# Patient Record
Sex: Female | Born: 1960 | Race: White | Hispanic: No | Smoking: Current every day smoker
Health system: Southern US, Community
[De-identification: ages and names within clinical notes are randomized; demographics above are authoritative.]

## PROBLEM LIST (undated history)

## (undated) DIAGNOSIS — E11621 Type 2 diabetes mellitus with foot ulcer: Secondary | ICD-10-CM

## (undated) DIAGNOSIS — G4730 Sleep apnea, unspecified: Secondary | ICD-10-CM

## (undated) DIAGNOSIS — N189 Chronic kidney disease, unspecified: Secondary | ICD-10-CM

## (undated) DIAGNOSIS — K279 Peptic ulcer, site unspecified, unspecified as acute or chronic, without hemorrhage or perforation: Secondary | ICD-10-CM

## (undated) DIAGNOSIS — D649 Anemia, unspecified: Secondary | ICD-10-CM

## (undated) DIAGNOSIS — J449 Chronic obstructive pulmonary disease, unspecified: Secondary | ICD-10-CM

## (undated) DIAGNOSIS — R0600 Dyspnea, unspecified: Secondary | ICD-10-CM

## (undated) DIAGNOSIS — F329 Major depressive disorder, single episode, unspecified: Secondary | ICD-10-CM

## (undated) DIAGNOSIS — R51 Headache: Secondary | ICD-10-CM

## (undated) DIAGNOSIS — L97509 Non-pressure chronic ulcer of other part of unspecified foot with unspecified severity: Secondary | ICD-10-CM

## (undated) DIAGNOSIS — G629 Polyneuropathy, unspecified: Secondary | ICD-10-CM

## (undated) DIAGNOSIS — I10 Essential (primary) hypertension: Secondary | ICD-10-CM

## (undated) DIAGNOSIS — Z860100 Personal history of colon polyps, unspecified: Secondary | ICD-10-CM

## (undated) DIAGNOSIS — I219 Acute myocardial infarction, unspecified: Secondary | ICD-10-CM

## (undated) DIAGNOSIS — K219 Gastro-esophageal reflux disease without esophagitis: Secondary | ICD-10-CM

## (undated) DIAGNOSIS — E785 Hyperlipidemia, unspecified: Secondary | ICD-10-CM

## (undated) DIAGNOSIS — M1990 Unspecified osteoarthritis, unspecified site: Secondary | ICD-10-CM

## (undated) DIAGNOSIS — F32A Depression, unspecified: Secondary | ICD-10-CM

## (undated) DIAGNOSIS — F419 Anxiety disorder, unspecified: Secondary | ICD-10-CM

## (undated) HISTORY — DX: Gastro-esophageal reflux disease without esophagitis: K21.9

## (undated) HISTORY — DX: Depression, unspecified: F32.A

## (undated) HISTORY — DX: Personal history of colon polyps, unspecified: Z86.0100

## (undated) HISTORY — DX: Major depressive disorder, single episode, unspecified: F32.9

## (undated) HISTORY — DX: Anemia, unspecified: D64.9

## (undated) HISTORY — DX: Headache: R51

## (undated) HISTORY — DX: Essential (primary) hypertension: I10

## (undated) HISTORY — DX: Peptic ulcer, site unspecified, unspecified as acute or chronic, without hemorrhage or perforation: K27.9

## (undated) HISTORY — DX: Hyperlipidemia, unspecified: E78.5

## (undated) HISTORY — DX: Anxiety disorder, unspecified: F41.9

## (undated) HISTORY — PX: TUBAL LIGATION: SHX77

## (undated) HISTORY — DX: Personal history of colonic polyps: Z86.010

---

## 1998-02-19 ENCOUNTER — Inpatient Hospital Stay (HOSPITAL_COMMUNITY): Admission: RE | Admit: 1998-02-19 | Discharge: 1998-02-21 | Payer: Self-pay | Admitting: Obstetrics and Gynecology

## 1999-11-25 ENCOUNTER — Encounter: Payer: Self-pay | Admitting: Family Medicine

## 1999-11-25 ENCOUNTER — Ambulatory Visit (HOSPITAL_COMMUNITY): Admission: RE | Admit: 1999-11-25 | Discharge: 1999-11-25 | Payer: Self-pay | Admitting: Family Medicine

## 1999-12-10 ENCOUNTER — Inpatient Hospital Stay (HOSPITAL_COMMUNITY): Admission: EM | Admit: 1999-12-10 | Discharge: 1999-12-12 | Payer: Self-pay | Admitting: Emergency Medicine

## 2000-06-11 ENCOUNTER — Encounter: Payer: Self-pay | Admitting: Family Medicine

## 2000-06-11 ENCOUNTER — Ambulatory Visit (HOSPITAL_COMMUNITY): Admission: RE | Admit: 2000-06-11 | Discharge: 2000-06-11 | Payer: Self-pay | Admitting: Family Medicine

## 2002-11-03 ENCOUNTER — Ambulatory Visit (HOSPITAL_COMMUNITY): Admission: RE | Admit: 2002-11-03 | Discharge: 2002-11-03 | Payer: Self-pay | Admitting: Family Medicine

## 2002-11-03 ENCOUNTER — Encounter: Payer: Self-pay | Admitting: Family Medicine

## 2004-01-07 ENCOUNTER — Ambulatory Visit (HOSPITAL_COMMUNITY): Admission: RE | Admit: 2004-01-07 | Discharge: 2004-01-07 | Payer: Self-pay | Admitting: Emergency Medicine

## 2004-01-08 ENCOUNTER — Emergency Department (HOSPITAL_COMMUNITY): Admission: EM | Admit: 2004-01-08 | Discharge: 2004-01-08 | Payer: Self-pay | Admitting: Emergency Medicine

## 2004-02-27 ENCOUNTER — Other Ambulatory Visit: Admission: RE | Admit: 2004-02-27 | Discharge: 2004-02-27 | Payer: Self-pay | Admitting: Family Medicine

## 2005-07-10 ENCOUNTER — Encounter: Admission: RE | Admit: 2005-07-10 | Discharge: 2005-07-10 | Payer: Self-pay | Admitting: Sports Medicine

## 2005-07-24 ENCOUNTER — Ambulatory Visit (HOSPITAL_COMMUNITY): Admission: RE | Admit: 2005-07-24 | Discharge: 2005-07-24 | Payer: Self-pay | Admitting: *Deleted

## 2005-07-27 ENCOUNTER — Ambulatory Visit (HOSPITAL_COMMUNITY): Admission: RE | Admit: 2005-07-27 | Discharge: 2005-07-27 | Payer: Self-pay | Admitting: *Deleted

## 2005-08-06 ENCOUNTER — Encounter: Admission: RE | Admit: 2005-08-06 | Discharge: 2005-08-06 | Payer: Self-pay | Admitting: Sports Medicine

## 2005-09-18 ENCOUNTER — Other Ambulatory Visit: Admission: RE | Admit: 2005-09-18 | Discharge: 2005-09-18 | Payer: Self-pay | Admitting: Family Medicine

## 2005-09-25 ENCOUNTER — Encounter: Admission: RE | Admit: 2005-09-25 | Discharge: 2005-09-25 | Payer: Self-pay | Admitting: Sports Medicine

## 2005-12-01 ENCOUNTER — Encounter: Admission: RE | Admit: 2005-12-01 | Discharge: 2005-12-01 | Payer: Self-pay | Admitting: Sports Medicine

## 2005-12-16 ENCOUNTER — Encounter: Admission: RE | Admit: 2005-12-16 | Discharge: 2005-12-16 | Payer: Self-pay | Admitting: Sports Medicine

## 2006-01-25 ENCOUNTER — Encounter: Admission: RE | Admit: 2006-01-25 | Discharge: 2006-01-25 | Payer: Self-pay | Admitting: Sports Medicine

## 2006-02-03 ENCOUNTER — Encounter: Admission: RE | Admit: 2006-02-03 | Discharge: 2006-02-03 | Payer: Self-pay | Admitting: Sports Medicine

## 2006-02-19 ENCOUNTER — Encounter: Admission: RE | Admit: 2006-02-19 | Discharge: 2006-02-19 | Payer: Self-pay | Admitting: Sports Medicine

## 2006-12-02 ENCOUNTER — Inpatient Hospital Stay (HOSPITAL_COMMUNITY): Admission: AD | Admit: 2006-12-02 | Discharge: 2006-12-10 | Payer: Self-pay | Admitting: Psychiatry

## 2006-12-02 ENCOUNTER — Ambulatory Visit: Payer: Self-pay | Admitting: Psychiatry

## 2009-01-08 ENCOUNTER — Encounter (INDEPENDENT_AMBULATORY_CARE_PROVIDER_SITE_OTHER): Payer: Self-pay | Admitting: *Deleted

## 2009-10-17 ENCOUNTER — Telehealth: Payer: Self-pay | Admitting: Gastroenterology

## 2010-02-11 ENCOUNTER — Encounter: Payer: Self-pay | Admitting: Internal Medicine

## 2010-02-19 ENCOUNTER — Encounter: Payer: Self-pay | Admitting: Internal Medicine

## 2010-02-27 ENCOUNTER — Encounter: Payer: Self-pay | Admitting: Internal Medicine

## 2010-02-27 DIAGNOSIS — E119 Type 2 diabetes mellitus without complications: Secondary | ICD-10-CM | POA: Insufficient documentation

## 2010-02-27 DIAGNOSIS — F32A Depression, unspecified: Secondary | ICD-10-CM | POA: Insufficient documentation

## 2010-02-27 DIAGNOSIS — M47817 Spondylosis without myelopathy or radiculopathy, lumbosacral region: Secondary | ICD-10-CM | POA: Insufficient documentation

## 2010-03-18 LAB — CONVERTED CEMR LAB: Pap Smear: NORMAL

## 2010-07-16 ENCOUNTER — Emergency Department (HOSPITAL_COMMUNITY)
Admission: EM | Admit: 2010-07-16 | Discharge: 2010-07-16 | Payer: Self-pay | Source: Home / Self Care | Admitting: Emergency Medicine

## 2010-08-19 LAB — HM PAP SMEAR: HM Pap smear: NORMAL

## 2010-09-11 ENCOUNTER — Encounter: Payer: Self-pay | Admitting: Internal Medicine

## 2010-09-11 ENCOUNTER — Ambulatory Visit: Payer: Self-pay | Admitting: Internal Medicine

## 2010-09-11 ENCOUNTER — Telehealth (INDEPENDENT_AMBULATORY_CARE_PROVIDER_SITE_OTHER): Payer: Self-pay | Admitting: *Deleted

## 2010-09-11 DIAGNOSIS — F418 Other specified anxiety disorders: Secondary | ICD-10-CM | POA: Insufficient documentation

## 2010-09-11 DIAGNOSIS — R51 Headache: Secondary | ICD-10-CM | POA: Insufficient documentation

## 2010-09-11 DIAGNOSIS — IMO0002 Reserved for concepts with insufficient information to code with codable children: Secondary | ICD-10-CM | POA: Insufficient documentation

## 2010-09-11 DIAGNOSIS — Z794 Long term (current) use of insulin: Secondary | ICD-10-CM

## 2010-09-11 DIAGNOSIS — D509 Iron deficiency anemia, unspecified: Secondary | ICD-10-CM | POA: Insufficient documentation

## 2010-09-11 DIAGNOSIS — E1169 Type 2 diabetes mellitus with other specified complication: Secondary | ICD-10-CM

## 2010-09-11 DIAGNOSIS — F17200 Nicotine dependence, unspecified, uncomplicated: Secondary | ICD-10-CM | POA: Insufficient documentation

## 2010-09-11 DIAGNOSIS — E785 Hyperlipidemia, unspecified: Secondary | ICD-10-CM | POA: Insufficient documentation

## 2010-09-11 DIAGNOSIS — R519 Headache, unspecified: Secondary | ICD-10-CM | POA: Insufficient documentation

## 2010-09-11 DIAGNOSIS — K219 Gastro-esophageal reflux disease without esophagitis: Secondary | ICD-10-CM | POA: Insufficient documentation

## 2010-09-11 DIAGNOSIS — I10 Essential (primary) hypertension: Secondary | ICD-10-CM | POA: Insufficient documentation

## 2010-09-11 DIAGNOSIS — E1165 Type 2 diabetes mellitus with hyperglycemia: Secondary | ICD-10-CM

## 2010-09-11 DIAGNOSIS — Z860100 Personal history of colon polyps, unspecified: Secondary | ICD-10-CM | POA: Insufficient documentation

## 2010-09-11 DIAGNOSIS — E119 Type 2 diabetes mellitus without complications: Secondary | ICD-10-CM | POA: Insufficient documentation

## 2010-09-11 DIAGNOSIS — F411 Generalized anxiety disorder: Secondary | ICD-10-CM | POA: Insufficient documentation

## 2010-09-11 DIAGNOSIS — K279 Peptic ulcer, site unspecified, unspecified as acute or chronic, without hemorrhage or perforation: Secondary | ICD-10-CM | POA: Insufficient documentation

## 2010-09-11 LAB — CONVERTED CEMR LAB
ALT: 14 units/L (ref 0–35)
AST: 22 units/L (ref 0–37)
Albumin: 3.4 g/dL — ABNORMAL LOW (ref 3.5–5.2)
Basophils Relative: 0.6 % (ref 0.0–3.0)
Creatinine,U: 118.2 mg/dL
Eosinophils Relative: 5.3 % — ABNORMAL HIGH (ref 0.0–5.0)
GFR calc non Af Amer: 86.29 mL/min (ref >60.00)
HCT: 33.7 % — ABNORMAL LOW (ref 36.0–46.0)
HDL: 37.1 mg/dL — ABNORMAL LOW (ref >39.00)
Hemoglobin: 11.1 g/dL — ABNORMAL LOW (ref 12.0–15.0)
Ketones, ur: NEGATIVE mg/dL
Lithium Lvl: 0.73 meq/L — ABNORMAL LOW
Lymphs Abs: 2.6 10*3/uL (ref 0.7–4.0)
Microalb Creat Ratio: 11.6 mg/g (ref 0.0–30.0)
Monocytes Relative: 4.8 % (ref 3.0–12.0)
Neutro Abs: 6.9 10*3/uL (ref 1.4–7.7)
Potassium: 4.7 meq/L (ref 3.5–5.1)
RBC: 4.15 M/uL (ref 3.87–5.11)
Sodium: 136 meq/L (ref 135–145)
Specific Gravity, Urine: 1.015 (ref 1.000–1.030)
TSH: 1.7 microintl units/mL (ref 0.35–5.50)
Total Bilirubin: 0.2 mg/dL — ABNORMAL LOW (ref 0.3–1.2)
Transferrin: 285 mg/dL (ref 212.0–360.0)
Urine Glucose: >=1000 mg/dL
Urobilinogen, UA: 0.2 (ref 0.0–1.0)
VLDL: 42.2 mg/dL — ABNORMAL HIGH (ref 0.0–40.0)
Vitamin B-12: 161 pg/mL — ABNORMAL LOW (ref 211–911)
WBC: 10.7 10*3/uL — ABNORMAL HIGH (ref 4.5–10.5)

## 2010-09-23 ENCOUNTER — Telehealth: Payer: Self-pay | Admitting: Internal Medicine

## 2010-09-23 ENCOUNTER — Ambulatory Visit: Payer: Self-pay | Admitting: Internal Medicine

## 2010-09-26 ENCOUNTER — Encounter
Admission: RE | Admit: 2010-09-26 | Discharge: 2010-09-26 | Payer: Self-pay | Source: Home / Self Care | Attending: Internal Medicine | Admitting: Internal Medicine

## 2010-09-30 ENCOUNTER — Telehealth: Payer: Self-pay | Admitting: Internal Medicine

## 2010-10-01 ENCOUNTER — Ambulatory Visit
Admission: RE | Admit: 2010-10-01 | Discharge: 2010-10-01 | Payer: Self-pay | Source: Home / Self Care | Attending: Internal Medicine | Admitting: Internal Medicine

## 2010-10-01 DIAGNOSIS — G5620 Lesion of ulnar nerve, unspecified upper limb: Secondary | ICD-10-CM | POA: Insufficient documentation

## 2010-10-01 DIAGNOSIS — J019 Acute sinusitis, unspecified: Secondary | ICD-10-CM | POA: Insufficient documentation

## 2010-10-01 DIAGNOSIS — D518 Other vitamin B12 deficiency anemias: Secondary | ICD-10-CM | POA: Insufficient documentation

## 2010-10-08 ENCOUNTER — Ambulatory Visit: Admit: 2010-10-08 | Payer: Self-pay | Admitting: Internal Medicine

## 2010-10-25 ENCOUNTER — Encounter: Payer: Self-pay | Admitting: *Deleted

## 2010-10-27 ENCOUNTER — Telehealth: Payer: Self-pay | Admitting: Internal Medicine

## 2010-11-04 NOTE — Progress Notes (Signed)
Summary: Schedule NP3  Phone Note Outgoing Call Call back at [facility] Phone [phone]   Call placed by: Harlow Mares CMA Duncan Dull),  October 17, 2009 2:09 PM Call placed to: Patient Summary of Call: patients number is disconnected and the only other number is a emergancy contact that is disconnected also.  Initial call taken by: Harlow Mares CMA (AAMA),  October 17, 2009 2:09 PM

## 2010-11-04 NOTE — Assessment & Plan Note (Signed)
Summary: NEW/ MEDICARE/ DM / NWS  #   Vital Signs:  Patient profile:   50 year old female Menstrual status:  irregular LMP:     09/11/2010 Height:      66 inches Weight:      310 pounds BMI:     50.22 O2 Sat:      95 % on Room air Temp:     97.5 degrees F oral Pulse rate:   90 / minute Pulse rhythm:   regular Resp:     16 per minute BP sitting:   140 / 70  (left arm) Cuff size:   large  Vitals Entered By: Rock Nephew CMA (September 11, 2010 8:59 AM)  O2 Flow:  Room air CC: New to establish, Preventive Care, Hypertension Management Is Patient Diabetic? Yes Did you bring your meter with you today? No Pain Assessment Patient in pain? no       Does patient need assistance? Functional Status Self care Ambulation Normal LMP (date): 09/11/2010     Menstrual Status irregular Enter LMP: 09/11/2010 Last PAP Result Normal   Primary Care Provider:  Etta Grandchild MD  CC:  New to establish, Preventive Care, and Hypertension Management.  History of Present Illness: New to me she needs a new PCP - she has no records from her prior PCP Dr. Maisie Fus in [place]. She saw a neurologist, Dr. Adella Hare, in [place] several months ago for weakness and she tells me that his exam and a brain scan were normal and she was told that her symptoms were from a low magnesium level. She says that her last A1C= 10.1 about 3-4 months ago. She is homeless b/c her husband took their children away from her and she lost her child support.  Dyspepsia History:      She has no alarm features of dyspepsia including no history of melena, hematochezia, dysphagia, persistent vomiting, or involuntary weight loss > 5%.  There is a prior history of GERD.  The patient has a prior history of documented ulcer disease.  The dominant symptom is heartburn or acid reflux.  An H-2 blocker medication is currently being taken.  She notes that the symptoms have improved with the H-2 blocker therapy.  Symptoms have not  persisted after 4 weeks of H-2 blocker treatment.    Hypertension History:      She complains of side effects from treatment, but denies headache, chest pain, palpitations, dyspnea with exertion, orthopnea, PND, peripheral edema, visual symptoms, neurologic problems, and syncope.  She notes the following problems with antihypertensive medication side effects: cough.        Positive major cardiovascular risk factors include diabetes, hyperlipidemia, hypertension, and current tobacco user.  Negative major cardiovascular risk factors include female age less than 52 years old.      Preventive Screening-Counseling & Management  Alcohol-Tobacco     Alcohol drinks/day: 0     Alcohol Counseling: not indicated; patient does not drink     Smoking Status: current     Smoking Cessation Counseling: yes     Smoke Cessation Stage: precontemplative     Packs/Day: 1.0     Year Started: 1998     Pack years: 34     Tobacco Counseling: to quit use of tobacco products  Caffeine-Diet-Exercise     Does Patient Exercise: no  Hep-HIV-STD-Contraception     Hepatitis Risk: no risk noted     HIV Risk: no risk noted     STD Risk: no  risk noted     Dental Visit-last 6 months no     Dental Care Counseling: to seek dental care; no dental care within six months     SBE monthly: yes     SBE Education/Counseling: to perform regular SBE     Sun Exposure-Excessive: no     Sun Exposure Counseling: not indicated; sun exposure is acceptable      Sexual History:  not active.        Drug Use:  no.        Blood Transfusions:  no.    Medications Prior to Update: 1)  None  Current Medications (verified): 1)  Lithium Carbonate 300 Mg Caps (Lithium Carbonate) .... Take 2 Tabs Two Times A Day 2)  Clonazepam 1 Mg Tabs (Clonazepam) .... Take 1 Tablet By Mouth Two Times A Day 3)  Omeprazole 20 Mg Tbec (Omeprazole) .... Take 1 Tablet By Mouth Once A Day 4)  Gabapentin 300 Mg Caps (Gabapentin) .... Take 2 Tabs Two Times  A Day 5)  Metformin Hcl 1000 Mg Tabs (Metformin Hcl) .... Take 1 Tablet By Mouth Two Times A Day 6)  Magnesium 250 Mg Tabs (Magnesium) .... Take 1 Tablet By Mouth Once A Day 7)  Trazodone Hcl 50 Mg Tabs (Trazodone Hcl) .... Take 1 Tab By Mouth At Bedtime 8)  Topiramate 25 Mg Tabs (Topiramate) .... Take 1 Tab Qam and 2 Tabs Qhs 9)  Cymbalta 60 Mg Cpep (Duloxetine Hcl) .... Take 1 Tablet By Mouth Two Times A Day 10)  Buspirone Hcl 15 Mg Tabs (Buspirone Hcl) .... Take 1 Tablet By Mouth Three Times A Day 11)  Levemir 100 Unit/ml Soln (Insulin Detemir) .... 40units Daily 12)  Novolog Flexpen 100 Unit/ml Soln (Insulin Aspart) .... As Directed Sliding Scale 13)  Diovan 160 Mg Tabs (Valsartan) .... One By Mouth Once Daily For High Blood Pressure  Allergies (verified): 1)  ! Lisinopril  Past History:  Past Medical History: Anemia-iron deficiency Anxiety Depression Diabetes mellitus, type II Hyperlipidemia Hypertension Peptic ulcer disease Colonic polyps, hx of Headache GERD  Past Surgical History: Denies surgical history  Family History: Family History of Alcoholism/Addiction Family History of Arthritis Family History Breast cancer 1st degree relative <50 Family History of Colon CA 1st degree relative <60 Family History Depression Family History Diabetes 1st degree relative Family History Hypertension Family History Kidney disease Family History Lung cancer Family History of Prostate CA 1st degree relative <50  Social History: Divorced/disabled/homeless Current Smoker Alcohol use-no Drug use-no Regular exercise-no Smoking Status:  current Packs/Day:  1.0 Hepatitis Risk:  no risk noted HIV Risk:  no risk noted STD Risk:  no risk noted Dental Care w/in 6 mos.:  no Sun Exposure-Excessive:  no Sexual History:  not active Blood Transfusions:  no Drug Use:  no Does Patient Exercise:  no  Review of Systems       The patient complains of weight gain and prolonged cough.   The patient denies anorexia, fever, weight loss, hoarseness, chest pain, syncope, dyspnea on exertion, peripheral edema, headaches, hemoptysis, abdominal pain, melena, hematochezia, severe indigestion/heartburn, hematuria, transient blindness, difficulty walking, enlarged lymph nodes, angioedema, and breast masses.   Resp:  Complains of cough; denies chest discomfort, chest pain with inspiration, coughing up blood, excessive snoring, morning headaches, pleuritic, shortness of breath, sputum productive, and wheezing. Psych:  Complains of depression and mental problems; denies anxiety, easily angered, easily tearful, irritability, panic attacks, sense of great danger, suicidal thoughts/plans, thoughts of violence, unusual  visions or sounds, and thoughts /plans of harming others. Endo:  Complains of polyuria and weight change; denies cold intolerance, excessive hunger, excessive thirst, excessive urination, and heat intolerance. Heme:  Complains of pallor; denies abnormal bruising, bleeding, enlarge lymph nodes, fevers, and skin discoloration.  Physical Exam  General:  alert, well-developed, well-nourished, well-hydrated, appropriate dress, normal appearance, cooperative to examination, good hygiene, and overweight-appearing.   Head:  normocephalic, atraumatic, no abnormalities observed, and no abnormalities palpated.   Eyes:  vision grossly intact, pupils equal, and pupils round.   Mouth:  good dentition, pharynx pink and moist, no erythema, no exudates, no posterior lymphoid hypertrophy, no postnasal drip, no pharyngeal crowing, no lesions, no leukoplakia, and no petechiae.   Neck:  supple, full ROM, no masses, no thyromegaly, no JVD, normal carotid upstroke, no carotid bruits, no cervical lymphadenopathy, and no neck tenderness.   Lungs:  normal respiratory effort, no intercostal retractions, no accessory muscle use, normal breath sounds, no dullness, no fremitus, no crackles, and no wheezes.   Heart:   normal rate, regular rhythm, no murmur, no gallop, no rub, and no JVD.   Abdomen:  soft, non-tender, normal bowel sounds, no distention, no masses, no guarding, no rigidity, no rebound tenderness, no abdominal hernia, no inguinal hernia, no hepatomegaly, and no splenomegaly.   Msk:  normal ROM, no joint tenderness, no joint swelling, no joint warmth, no redness over joints, no joint deformities, no joint instability, and no crepitation.   Pulses:  R and L carotid,radial,femoral,dorsalis pedis and posterior tibial pulses are full and equal bilaterally Extremities:  No clubbing, cyanosis, edema, or deformity noted with normal full range of motion of all joints.   Neurologic:  No cranial nerve deficits noted. Station and gait are normal. Plantar reflexes are down-going bilaterally. DTRs are symmetrical throughout. Sensory, motor and coordinative functions appear intact. Skin:  turgor normal, color normal, no rashes, no suspicious lesions, no ecchymoses, no petechiae, no purpura, no ulcerations, and no edema.   Cervical Nodes:  No lymphadenopathy noted Axillary Nodes:  No palpable lymphadenopathy Inguinal Nodes:  No significant adenopathy Psych:  Oriented X3, memory intact for recent and remote, normally interactive, good eye contact, not anxious appearing, not agitated, not suicidal, not homicidal, and subdued.    Diabetes Management Exam:    Foot Exam (with socks and/or shoes not present):       Sensory-Pinprick/Light touch:          Left medial foot (L-4): normal          Left dorsal foot (L-5): normal          Left lateral foot (S-1): normal          Right medial foot (L-4): normal          Right dorsal foot (L-5): normal          Right lateral foot (S-1): normal       Sensory-Monofilament:          Left foot: normal          Right foot: normal       Inspection:          Left foot: normal          Right foot: normal       Nails:          Left foot: normal          Right foot:  normal   Impression & Recommendations:  Problem # 1:  HYPERTENSION (ICD-401.9)  Assessment Unchanged  Her updated medication list for this problem includes:    Diovan 160 Mg Tabs (Valsartan) ..... One by mouth once daily for high blood pressure  BP today: 140/70  10 Yr Risk Heart Disease: Not enough information  Problem # 2:  DIABETES MELLITUS, TYPE II (ICD-250.00) Assessment: Unchanged  Her updated medication list for this problem includes:    Metformin Hcl 1000 Mg Tabs (Metformin hcl) .Marland Kitchen... Take 1 tablet by mouth two times a day    Levemir 100 Unit/ml Soln (Insulin detemir) .Marland KitchenMarland KitchenMarland KitchenMarland Kitchen 40units daily    Novolog Flexpen 100 Unit/ml Soln (Insulin aspart) .Marland Kitchen... As directed sliding scale    Diovan 160 Mg Tabs (Valsartan) ..... One by mouth once daily for high blood pressure  Orders: Venipuncture (14782) TLB-B12 + Folate Pnl (95621_30865-H84/ONG) TLB-IBC Pnl (Iron/FE;Transferrin) (83550-IBC) TLB-Lipid Panel (80061-LIPID) TLB-BMP (Basic Metabolic Panel-BMET) (80048-METABOL) TLB-CBC Platelet - w/Differential (85025-CBCD) TLB-Hepatic/Liver Function Pnl (80076-HEPATIC) TLB-TSH (Thyroid Stimulating Hormone) (84443-TSH) TLB-A1C / Hgb A1C (Glycohemoglobin) (83036-A1C) TLB-Udip w/ Micro (81001-URINE) TLB-Microalbumin/Creat Ratio, Urine (82043-MALB) TLB-Magnesium (Mg) (83735-MG) T-Lithium Level (29528-41324) Ophthalmology Referral (Ophthalmology)  Problem # 3:  ANEMIA-IRON DEFICIENCY (ICD-280.9) Assessment: Unchanged  Orders: Venipuncture (40102) TLB-B12 + Folate Pnl (72536_64403-K74/QVZ) TLB-IBC Pnl (Iron/FE;Transferrin) (83550-IBC) TLB-Lipid Panel (80061-LIPID) TLB-BMP (Basic Metabolic Panel-BMET) (80048-METABOL) TLB-CBC Platelet - w/Differential (85025-CBCD) TLB-Hepatic/Liver Function Pnl (80076-HEPATIC) TLB-TSH (Thyroid Stimulating Hormone) (84443-TSH) TLB-A1C / Hgb A1C (Glycohemoglobin) (83036-A1C) TLB-Udip w/ Micro (81001-URINE) TLB-Microalbumin/Creat Ratio, Urine  (82043-MALB) TLB-Magnesium (Mg) (83735-MG) T-Lithium Level (56387-56433)  Problem # 4:  COUGH (ICD-786.2) Assessment: New stop the ACEI and check a chest xray for masses, pna, lesions Orders: T-2 View CXR (71020TC)  Problem # 5:  HOMELESS PERSON (ICD-V60.0) Assessment: New  Orders: Social Work Referral (Social )  Problem # 6:  LONG TERM USE HIGH RISK MEDICATION (ICD-V58.69) Assessment: New check Lithium level, renal function, and thyroid function Orders: Venipuncture (29518) TLB-B12 + Folate Pnl (84166_06301-S01/UXN) TLB-IBC Pnl (Iron/FE;Transferrin) (83550-IBC) TLB-Lipid Panel (80061-LIPID) TLB-BMP (Basic Metabolic Panel-BMET) (80048-METABOL) TLB-CBC Platelet - w/Differential (85025-CBCD) TLB-Hepatic/Liver Function Pnl (80076-HEPATIC) TLB-TSH (Thyroid Stimulating Hormone) (84443-TSH) TLB-A1C / Hgb A1C (Glycohemoglobin) (83036-A1C) TLB-Udip w/ Micro (81001-URINE) TLB-Microalbumin/Creat Ratio, Urine (82043-MALB) TLB-Magnesium (Mg) (83735-MG) T-Lithium Level (23557-32202)  Complete Medication List: 1)  Lithium Carbonate 300 Mg Caps (Lithium carbonate) .... Take 2 tabs two times a day 2)  Clonazepam 1 Mg Tabs (Clonazepam) .... Take 1 tablet by mouth two times a day 3)  Omeprazole 20 Mg Tbec (Omeprazole) .... Take 1 tablet by mouth once a day 4)  Gabapentin 300 Mg Caps (Gabapentin) .... Take 2 tabs two times a day 5)  Metformin Hcl 1000 Mg Tabs (Metformin hcl) .... Take 1 tablet by mouth two times a day 6)  Magnesium 250 Mg Tabs (Magnesium) .... Take 1 tablet by mouth once a day 7)  Trazodone Hcl 50 Mg Tabs (Trazodone hcl) .... Take 1 tab by mouth at bedtime 8)  Topiramate 25 Mg Tabs (Topiramate) .... Take 1 tab qam and 2 tabs qhs 9)  Cymbalta 60 Mg Cpep (Duloxetine hcl) .... Take 1 tablet by mouth two times a day 10)  Buspirone Hcl 15 Mg Tabs (Buspirone hcl) .... Take 1 tablet by mouth three times a day 11)  Levemir 100 Unit/ml Soln (Insulin detemir) .... 40units daily 12)   Novolog Flexpen 100 Unit/ml Soln (Insulin aspart) .... As directed sliding scale 13)  Diovan 160 Mg Tabs (Valsartan) .... One by mouth once daily for high blood pressure  Other Orders: Radiology Referral (Radiology)  Hypertension Assessment/Plan:  The patient's hypertensive risk group is category C: Target organ damage and/or diabetes.  Today's blood pressure is 140/70.  Her blood pressure goal is < 130/80.  Colorectal Screening:  Colonoscopy Results:    Date of Exam: 07/03/2010    Results: Normal  PAP Screening:    Hx Cervical Dysplasia in last 5 yrs? No    3 normal PAP smears in last 5 yrs? Yes    Last PAP smear:  03/18/2010  PAP Smear Results:    Date of Exam:  03/18/2010    Results:  Normal  Mammogram Screening:    Reviewed Mammogram recommendations:  mammogram ordered  Osteoporosis Risk Assessment:  Risk Factors for Fracture or Low Bone Density:   Race (White or Asian):     yes   Smoking status:       current  Immunization & Chemoprophylaxis:    Influenza vaccine: Historical  (07/05/2010)  Patient Instructions: 1)  Please schedule a follow-up appointment in 2 months. 2)  Tobacco is very bad for your health and your loved ones! You Should stop smoking!. 3)  Stop Smoking Tips: Choose a Quit date. Cut down before the Quit date. decide what you will do as a substitute when you feel the urge to smoke(gum,toothpick,exercise). 4)  It is important that you exercise regularly at least 20 minutes 5 times a week. If you develop chest pain, have severe difficulty breathing, or feel very tired , stop exercising immediately and seek medical attention. 5)  You need to lose weight. Consider a lower calorie diet and regular exercise.  6)  Schedule your mammogram. 7)  Check your blood sugars regularly. If your readings are usually above 200 or below 70 you should contact our office. 8)  It is important that your Diabetic A1c level is checked every 3 months. 9)  See your eye doctor  yearly to check for diabetic eye damage. 10)  Check your feet each night for sore areas, calluses or signs of infection. 11)  Check your Blood Pressure regularly. If it is above 130/80: you should make an appointment. Prescriptions: DIOVAN 160 MG TABS (VALSARTAN) One by mouth once daily for high blood pressure  #84 x 0   Entered and Authorized by:   Etta Grandchild MD   Signed by:   Etta Grandchild MD on 09/11/2010   Method used:   Samples Given   RxID:   [id number]    Orders Added: 1)  Venipuncture [36415] 2)  TLB-B12 + Folate Pnl [82746_82607-B12/FOL] 3)  TLB-IBC Pnl (Iron/FE;Transferrin) [83550-IBC] 4)  TLB-Lipid Panel [80061-LIPID] 5)  TLB-BMP (Basic Metabolic Panel-BMET) [80048-METABOL] 6)  TLB-CBC Platelet - w/Differential [85025-CBCD] 7)  TLB-Hepatic/Liver Function Pnl [80076-HEPATIC] 8)  TLB-TSH (Thyroid Stimulating Hormone) [84443-TSH] 9)  TLB-A1C / Hgb A1C (Glycohemoglobin) [83036-A1C] 10)  TLB-Udip w/ Micro [81001-URINE] 11)  TLB-Microalbumin/Creat Ratio, Urine [82043-MALB] 12)  TLB-Magnesium (Mg) [83735-MG] 13)  T-Lithium Level [80178-23440] 14)  Social Work Referral [Social ] 15)  Radiology Referral [Radiology] 53)  Ophthalmology Referral [Ophthalmology] 31)  New Patient Level IV [99204] 18)  T-2 View CXR [71020TC]   Immunization History:  Influenza Immunization History:    Influenza:  historical (07/05/2010)   Immunization History:  Influenza Immunization History:    Influenza:  Historical (07/05/2010)  Preventive Care Screening  Colonoscopy:    Date:  10/05/2009    Results:  normal   Pap Smear:    Date:  10/05/2009    Results:  normal

## 2010-11-04 NOTE — Letter (Signed)
Summary: Results Follow-up Letter  [facility]  [address] [place], [place] [address]   Phone: [phone]  Fax: [phone]    09/11/2010  [address] [place], [place]  [address]  [place]  Dear Ms. Mulvehill,   The following are the results of your recent test(s):  Test     Result     B12 level     low Iron level     low CBC       anemia Blood sugars   high Liver/kidney   normal Urine       blood Thyroid     normal   _________________________________________________________  Please call for an appointment soon _________________________________________________________ _________________________________________________________ _________________________________________________________  Sincerely,  Sanda Linger MD [facility]

## 2010-11-04 NOTE — Letter (Signed)
Summary: Lipid Letter  [facility]  [address] [place], [place] [address]   Phone: [phone]  Fax: [phone]    09/11/2010  [name] [address] [place], [place]  [address]  Dear [name]:  We have carefully reviewed your last lipid profile from  and the results are noted below with a summary of recommendations for lipid management.    Cholesterol:       149     Goal: <200   HDL "good" Cholesterol:   57.84     Goal: >40   LDL "bad" Cholesterol:   86     Goal: <100   Triglycerides:       211.0     Goal: <150        TLC Diet (Therapeutic Lifestyle Change): Saturated Fats & Transfatty acids should be kept < 7% of total calories ***Reduce Saturated Fats Polyunstaurated Fat can be up to 10% of total calories Monounsaturated Fat Fat can be up to 20% of total calories Total Fat should be no greater than 25-35% of total calories Carbohydrates should be 50-60% of total calories Protein should be approximately 15% of total calories Fiber should be at least 20-30 grams a day ***Increased fiber may help lower LDL Total Cholesterol should be < 200mg /day Consider adding plant stanol/sterols to diet (example: Benacol spread) ***A higher intake of unsaturated fat may reduce Triglycerides and Increase HDL    Adjunctive Measures (may lower LIPIDS and reduce risk of Heart Attack) include: Aerobic Exercise (20-30 minutes 3-4 times a week) Limit Alcohol Consumption Weight Reduction Aspirin 75-81 mg a day by mouth (if not allergic or contraindicated) Dietary Fiber 20-30 grams a day by mouth     Current Medications: 1)    Lithium Carbonate 300 Mg Caps (Lithium carbonate) .... Take 2 tabs two times a day 2)    Clonazepam 1 Mg Tabs (Clonazepam) .... Take 1 tablet by mouth two times a day 3)    Omeprazole 20 Mg Tbec (Omeprazole) .... Take 1 tablet by mouth once a day 4)    Gabapentin 300 Mg Caps (Gabapentin) .... Take 2 tabs two times a day 5)    Metformin Hcl 1000 Mg Tabs  (Metformin hcl) .... Take 1 tablet by mouth two times a day 6)    Magnesium 250 Mg Tabs (Magnesium) .... Take 1 tablet by mouth once a day 7)    Trazodone Hcl 50 Mg Tabs (Trazodone hcl) .... Take 1 tab by mouth at bedtime 8)    Topiramate 25 Mg Tabs (Topiramate) .... Take 1 tab qam and 2 tabs qhs 9)    Cymbalta 60 Mg Cpep (Duloxetine hcl) .... Take 1 tablet by mouth two times a day 10)    Buspirone Hcl 15 Mg Tabs (Buspirone hcl) .... Take 1 tablet by mouth three times a day 11)    Levemir 100 Unit/ml Soln (Insulin detemir) .... 40units daily 12)    Novolog Flexpen 100 Unit/ml Soln (Insulin aspart) .... As directed sliding scale 13)    Diovan 160 Mg Tabs (Valsartan) .... One by mouth once daily for high blood pressure  If you have any questions, please call. We appreciate being able to work with you.   Sincerely,    [facility] Etta Grandchild MD

## 2010-11-06 NOTE — Assessment & Plan Note (Signed)
Summary: sinus infection?/SD   Vital Signs:  Patient profile:   50 year old female Menstrual status:  irregular Height:      66 inches (167.64 cm) Weight:      306.50 pounds (139.32 kg) BMI:     49.65 O2 Sat:      96 % on Room air Temp:     98.3 degrees F (36.83 degrees C) oral Pulse rate:   80 / minute Pulse rhythm:   regular Resp:     16 per minute BP sitting:   132 / 70  (left arm) Cuff size:   large  Vitals Entered By: Brenton Grills CMA Duncan Dull) (October 01, 2010 1:11 PM)  O2 Flow:  Room air CC: Sinus infection x 1 week (HA, productive cough with greenish mucus, post nasal drip, face pain)/aj, URI symptoms Is Patient Diabetic? Yes Did you bring your meter with you today? No Pain Assessment Patient in pain? no      Comments Pt is no longer taking Buspirone or Topiramate   Primary Care Provider:  Etta Grandchild MD  CC:  Sinus infection x 1 week (HA, productive cough with greenish mucus, post nasal drip, face pain)/aj, and URI symptoms.  History of Present Illness:  URI Symptoms      This is a 50 year old woman who presents with URI symptoms.  The symptoms began 2 weeks ago.  The severity is described as moderate.  The patient reports nasal congestion, purulent nasal discharge, and sore throat, but denies clear nasal discharge, dry cough, productive cough, earache, and sick contacts.  The patient denies fever, stiff neck, dyspnea, wheezing, rash, vomiting, diarrhea, use of an antipyretic, and response to antipyretic.  The patient denies itchy throat, sneezing, seasonal symptoms, headache, muscle aches, and severe fatigue.  Risk factors for Strep sinusitis include unilateral facial pain, unilateral nasal discharge, poor response to decongestant, and double sickening.  The patient denies the following risk factors for Strep sinusitis: tooth pain, Strep exposure, tender adenopathy, and absence of cough.    She tells me that she has been having paresthesias for over 6 months  and that she saw a Neurologist, Dr. Sedonia Small, in [place] and she was told that she had severe bilateral ulnar neuropathy and that she needed surgery so she wants me to refer her to an ortho doctor. She tells me that she had a big work-up with [facility] and [facility] and that the neurologist told her that nothing else was wrong with her.  Preventive Screening-Counseling & Management  Alcohol-Tobacco     Alcohol drinks/day: 0     Alcohol Counseling: not indicated; patient does not drink     Smoking Status: current     Smoking Cessation Counseling: yes     Smoke Cessation Stage: precontemplative     Packs/Day: 1.0     Year Started: 1998     Pack years: 53     Tobacco Counseling: to quit use of tobacco products  Hep-HIV-STD-Contraception     Hepatitis Risk: no risk noted     HIV Risk: no risk noted     STD Risk: no risk noted     Dental Visit-last 6 months no     Dental Care Counseling: to seek dental care; no dental care within six months     SBE monthly: yes     SBE Education/Counseling: to perform regular SBE     Sun Exposure-Excessive: no     Sun Exposure Counseling: not indicated; sun exposure is  acceptable  Clinical Review Panels:  Prevention   Last Mammogram:  ASSESSMENT: Negative - BI-RADS 1^MM DIGITAL SCREENING (09/26/2010)   Last Pap Smear:  Normal (03/18/2010)   Last Colonoscopy:  Normal (07/03/2010)  Immunizations   Last Flu Vaccine:  Historical (07/05/2010)  Lipid Management   Cholesterol:  149 (09/11/2010)   HDL (good cholesterol):  37.10 (09/11/2010)  Diabetes Management   HgBA1C:  8.6 (09/11/2010)   Creatinine:  0.8 (09/11/2010)   Last Foot Exam:  yes (10/01/2010)   Last Flu Vaccine:  Historical (07/05/2010)  CBC   WBC:  10.7 (09/11/2010)   RBC:  4.15 (09/11/2010)   Hgb:  11.1 (09/11/2010)   Hct:  33.7 (09/11/2010)   Platelets:  378.0 (09/11/2010)   MCV  81.2 (09/11/2010)   MCHC  32.8 (09/11/2010)   RDW  17.4 (09/11/2010)   PMN:  65.1 (09/11/2010)   Lymphs:   24.2 (09/11/2010)   Monos:  4.8 (09/11/2010)   Eosinophils:  5.3 (09/11/2010)   Basophil:  0.6 (09/11/2010)  Complete Metabolic Panel   Glucose:  275 (09/11/2010)   Sodium:  136 (09/11/2010)   Potassium:  4.7 (09/11/2010)   Chloride:  100 (09/11/2010)   CO2:  26 (09/11/2010)   BUN:  10 (09/11/2010)   Creatinine:  0.8 (09/11/2010)   Albumin:  3.4 (09/11/2010)   Total Protein:  6.4 (09/11/2010)   Calcium:  8.9 (09/11/2010)   Total Bili:  0.2 (09/11/2010)   Alk Phos:  105 (09/11/2010)   SGPT (ALT):  14 (09/11/2010)   SGOT (AST):  22 (09/11/2010)   Medications Prior to Update: 1)  Lithium Carbonate 300 Mg Caps (Lithium Carbonate) .... Take 2 Tabs Two Times A Day 2)  Clonazepam 1 Mg Tabs (Clonazepam) .... Take 1 Tablet By Mouth Two Times A Day 3)  Omeprazole 20 Mg Tbec (Omeprazole) .... Take 1 Tablet By Mouth Once A Day 4)  Gabapentin 300 Mg Caps (Gabapentin) .... Take 2 Tabs Two Times A Day 5)  Metformin Hcl 1000 Mg Tabs (Metformin Hcl) .... Take 1 Tablet By Mouth Two Times A Day 6)  Magnesium 250 Mg Tabs (Magnesium) .... Take 1 Tablet By Mouth Once A Day 7)  Trazodone Hcl 50 Mg Tabs (Trazodone Hcl) .... Take 1 Tab By Mouth At Bedtime 8)  Topiramate 25 Mg Tabs (Topiramate) .... Take 1 Tab Qam and 2 Tabs Qhs 9)  Cymbalta 60 Mg Cpep (Duloxetine Hcl) .... Take 1 Tablet By Mouth Two Times A Day 10)  Buspirone Hcl 15 Mg Tabs (Buspirone Hcl) .... Take 1 Tablet By Mouth Three Times A Day 11)  Levemir 100 Unit/ml Soln (Insulin Detemir) .... 40units Daily 12)  Novolog Flexpen 100 Unit/ml Soln (Insulin Aspart) .... As Directed Sliding Scale 13)  Diovan 160 Mg Tabs (Valsartan) .... One By Mouth Once Daily For High Blood Pressure  Current Medications (verified): 1)  Lithium Carbonate 300 Mg Caps (Lithium Carbonate) .... Take 2 Tabs Two Times A Day 2)  Clonazepam 1 Mg Tabs (Clonazepam) .... Take 1 Tablet By Mouth Two Times A Day 3)  Omeprazole 20 Mg Tbec (Omeprazole) .... Take 1 Tablet By  Mouth Once A Day 4)  Gabapentin 300 Mg Caps (Gabapentin) .... Take 2 Tabs Two Times A Day 5)  Metformin Hcl 1000 Mg Tabs (Metformin Hcl) .... Take 1 Tablet By Mouth Two Times A Day 6)  Magnesium 250 Mg Tabs (Magnesium) .... Take 1 Tablet By Mouth Once A Day 7)  Trazodone Hcl 50 Mg Tabs (Trazodone Hcl) .Marland KitchenMarland KitchenMarland Kitchen  Take 1 Tab By Mouth At Bedtime 8)  Topiramate 25 Mg Tabs (Topiramate) .... Take 1 Tab Qam and 2 Tabs Qhs 9)  Cymbalta 60 Mg Cpep (Duloxetine Hcl) .... Take 1 Tablet By Mouth Two Times A Day 10)  Buspirone Hcl 15 Mg Tabs (Buspirone Hcl) .... Take 1 Tablet By Mouth Three Times A Day 11)  Levemir 100 Unit/ml Soln (Insulin Detemir) .... 40units Daily 12)  Novolog Flexpen 100 Unit/ml Soln (Insulin Aspart) .... As Directed Sliding Scale 13)  Diovan 160 Mg Tabs (Valsartan) .... One By Mouth Once Daily For High Blood Pressure 14)  Ceftin 500 Mg Tab (Cefuroxime Axetil) .... Take One (1) Tablet By Mouth Two (2) Times A Day X 10 Days  Allergies (verified): 1)  ! Lisinopril  Past History:  Past Medical History: Last updated: 09/11/2010 Anemia-iron deficiency Anxiety Depression Diabetes mellitus, type II Hyperlipidemia Hypertension Peptic ulcer disease Colonic polyps, hx of Headache GERD  Past Surgical History: Last updated: 09/11/2010 Denies surgical history  Family History: Last updated: 09/11/2010 Family History of Alcoholism/Addiction Family History of Arthritis Family History Breast cancer 1st degree relative <50 Family History of Colon CA 1st degree relative <60 Family History Depression Family History Diabetes 1st degree relative Family History Hypertension Family History Kidney disease Family History Lung cancer Family History of Prostate CA 1st degree relative <50  Social History: Last updated: 09/11/2010 Divorced/disabled/homeless Current Smoker Alcohol use-no Drug use-no Regular exercise-no  Risk Factors: Alcohol Use: 0 (10/01/2010) Exercise: no  (09/11/2010)  Risk Factors: Smoking Status: current (10/01/2010) Packs/Day: 1.0 (10/01/2010)  Family History: Reviewed history from 09/11/2010 and no changes required. Family History of Alcoholism/Addiction Family History of Arthritis Family History Breast cancer 1st degree relative <50 Family History of Colon CA 1st degree relative <60 Family History Depression Family History Diabetes 1st degree relative Family History Hypertension Family History Kidney disease Family History Lung cancer Family History of Prostate CA 1st degree relative <50  Social History: Reviewed history from 09/11/2010 and no changes required. Divorced/disabled/homeless Current Smoker Alcohol use-no Drug use-no Regular exercise-no  Review of Systems       The patient complains of weight gain.  The patient denies anorexia, fever, weight loss, chest pain, syncope, dyspnea on exertion, peripheral edema, prolonged cough, headaches, hemoptysis, abdominal pain, hematuria, suspicious skin lesions, difficulty walking, depression, enlarged lymph nodes, and angioedema.   Neuro:  Complains of disturbances in coordination, numbness, and tingling; denies brief paralysis, difficulty with concentration, falling down, headaches, poor balance, seizures, visual disturbances, and weakness. Psych:  Denies anxiety, depression, easily angered, easily tearful, irritability, panic attacks, suicidal thoughts/plans, thoughts of violence, and unusual visions or sounds. Endo:  Denies cold intolerance, excessive hunger, excessive thirst, excessive urination, heat intolerance, polyuria, and weight change.  Physical Exam  General:  alert, well-developed, well-nourished, well-hydrated, appropriate dress, normal appearance, cooperative to examination, good hygiene, and overweight-appearing.   Head:  normocephalic, atraumatic, no abnormalities observed, and no abnormalities palpated.   Eyes:  vision grossly intact, pupils equal, pupils  round, and pupils reactive to light.   Ears:  R ear normal and L ear normal.   Nose:  no external deformity, no airflow obstruction, no intranasal foreign body, no nasal polyps, no nasal mucosal lesions, no mucosal friability, no active bleeding or clots, no septum abnormalities, nasal dischargemucosal pallor, mucosal edema, L maxillary sinus tenderness, and R maxillary sinus tenderness.   Mouth:  Oral mucosa and oropharynx without lesions or exudates.  Teeth in good repair. Neck:  supple, full ROM, no  masses, no thyromegaly, no thyroid nodules or tenderness, no JVD, normal carotid upstroke, no carotid bruits, no cervical lymphadenopathy, and no neck tenderness.   Lungs:  Normal respiratory effort, chest expands symmetrically. Lungs are clear to auscultation, no crackles or wheezes. Heart:  Normal rate and regular rhythm. S1 and S2 normal without gallop, murmur, click, rub or other extra sounds. Abdomen:  soft, non-tender, normal bowel sounds, no distention, no masses, no guarding, no rigidity, no rebound tenderness, no abdominal hernia, no inguinal hernia, no hepatomegaly, and no splenomegaly.   Msk:  normal ROM, no joint tenderness, no joint swelling, no joint warmth, no redness over joints, no joint deformities, no joint instability, and no crepitation.   Extremities:  No clubbing, cyanosis, edema, or deformity noted with normal full range of motion of all joints.   Neurologic:  No cranial nerve deficits noted. Station and gait are normal. Plantar reflexes are down-going bilaterally. DTRs are symmetrical throughout. Sensory, motor and coordinative functions appear intact. Skin:  turgor normal, color normal, no rashes, no suspicious lesions, no ecchymoses, no petechiae, no purpura, no ulcerations, and no edema.   Cervical Nodes:  No lymphadenopathy noted Psych:  Oriented X3, memory intact for recent and remote, normally interactive, good eye contact, not anxious appearing, not agitated, not suicidal,  not homicidal, and subdued.    Diabetes Management Exam:    Foot Exam (with socks and/or shoes not present):       Sensory-Pinprick/Light touch:          Left medial foot (L-4): normal          Left dorsal foot (L-5): normal          Left lateral foot (S-1): normal          Right medial foot (L-4): normal          Right dorsal foot (L-5): normal          Right lateral foot (S-1): normal       Sensory-Monofilament:          Left foot: normal          Right foot: normal       Inspection:          Left foot: normal          Right foot: normal       Nails:          Left foot: normal          Right foot: normal   Impression & Recommendations:  Problem # 1:  ANEMIA, B12 DEFICIENCY (ICD-281.1) I think her paresthesias are due to B12 defic. so I will replace that for her and get records from her neurologist to review her prior testing. Orders: Vit B12 1000 mcg (J3420) Admin of Therapeutic Inj  intramuscular or subcutaneous (29528)  Problem # 2:  ULNAR NEUROPATHY, BILATERAL (ICD-354.2) Assessment: New  Orders: Orthopedic Referral (Ortho)  Problem # 3:  HYPERTENSION (ICD-401.9) Assessment: Improved  Her updated medication list for this problem includes:    Diovan 160 Mg Tabs (Valsartan) ..... One by mouth once daily for high blood pressure  BP today: 132/70 Prior BP: 140/70 (09/11/2010)  Prior 10 Yr Risk Heart Disease: Not enough information (09/11/2010)  Labs Reviewed: K+: 4.7 (09/11/2010) Creat: : 0.8 (09/11/2010)   Chol: 149 (09/11/2010)   HDL: 37.10 (09/11/2010)   TG: 211.0 (09/11/2010)  Problem # 4:  DIABETES MELLITUS, TYPE II (ICD-250.00) Assessment: Deteriorated  Her updated medication list for this problem  includes:    Metformin Hcl 1000 Mg Tabs (Metformin hcl) .Marland Kitchen... Take 1 tablet by mouth two times a day    Levemir 100 Unit/ml Soln (Insulin detemir) .Marland KitchenMarland KitchenMarland KitchenMarland Kitchen 40units daily    Novolog Flexpen 100 Unit/ml Soln (Insulin aspart) .Marland Kitchen... As directed sliding scale     Diovan 160 Mg Tabs (Valsartan) ..... One by mouth once daily for high blood pressure  Labs Reviewed: Creat: 0.8 (09/11/2010)    Reviewed HgBA1c results: 8.6 (09/11/2010)  Problem # 5:  SINUSITIS- ACUTE-NOS (ICD-461.9) Assessment: New  Her updated medication list for this problem includes:    Ceftin 500 Mg Tab (Cefuroxime axetil) .Marland Kitchen... Take one (1) tablet by mouth two (2) times a day x 10 days  Instructed on treatment. Call if symptoms persist or worsen.   Complete Medication List: 1)  Lithium Carbonate 300 Mg Caps (Lithium carbonate) .... Take 2 tabs two times a day 2)  Clonazepam 1 Mg Tabs (Clonazepam) .... Take 1 tablet by mouth two times a day 3)  Omeprazole 20 Mg Tbec (Omeprazole) .... Take 1 tablet by mouth once a day 4)  Gabapentin 300 Mg Caps (Gabapentin) .... Take 2 tabs two times a day 5)  Metformin Hcl 1000 Mg Tabs (Metformin hcl) .... Take 1 tablet by mouth two times a day 6)  Magnesium 250 Mg Tabs (Magnesium) .... Take 1 tablet by mouth once a day 7)  Trazodone Hcl 50 Mg Tabs (Trazodone hcl) .... Take 1 tab by mouth at bedtime 8)  Topiramate 25 Mg Tabs (Topiramate) .... Take 1 tab qam and 2 tabs qhs 9)  Cymbalta 60 Mg Cpep (Duloxetine hcl) .... Take 1 tablet by mouth two times a day 10)  Buspirone Hcl 15 Mg Tabs (Buspirone hcl) .... Take 1 tablet by mouth three times a day 11)  Levemir 100 Unit/ml Soln (Insulin detemir) .... 40units daily 12)  Novolog Flexpen 100 Unit/ml Soln (Insulin aspart) .... As directed sliding scale 13)  Diovan 160 Mg Tabs (Valsartan) .... One by mouth once daily for high blood pressure 14)  Ceftin 500 Mg Tab (Cefuroxime axetil) .... Take one (1) tablet by mouth two (2) times a day x 10 days  Patient Instructions: 1)  Please schedule a follow-up appointment in 2 months. 2)  Tobacco is very bad for your health and your loved ones! You Should stop smoking!. 3)  Stop Smoking Tips: Choose a Quit date. Cut down before the Quit date. decide what you will  do as a substitute when you feel the urge to smoke(gum,toothpick,exercise). 4)  It is important that you exercise regularly at least 20 minutes 5 times a week. If you develop chest pain, have severe difficulty breathing, or feel very tired , stop exercising immediately and seek medical attention. 5)  You need to lose weight. Consider a lower calorie diet and regular exercise.  6)  Check your blood sugars regularly. If your readings are usually above 200 or below 70 you should contact our office. 7)  It is important that your Diabetic A1c level is checked every 3 months. 8)  See your eye doctor yearly to check for diabetic eye damage. 9)  Check your feet each night for sore areas, calluses or signs of infection. 10)  Check your Blood Pressure regularly. If it is abov 130/80: you should make an appointment. 11)  Take your antibiotic as prescribed until ALL of it is gone, but stop if you develop a rash or swelling and contact our office as soon as  possible. 12)  Acute sinusitis symptoms for less than 10 days are not helped by antibiotics.Use warm moist compresses, and over the counter decongestants ( only as directed). Call if no improvement in 5-7 days, sooner if increasing pain, fever, or new symptoms. Prescriptions: CEFTIN 500 MG TAB (CEFUROXIME AXETIL) Take one (1) tablet by mouth two (2) times a day X 10 days  #20 x 2   Entered and Authorized by:   Etta Grandchild MD   Signed by:   Etta Grandchild MD on 10/01/2010   Method used:   [facility]        [facility] [address] [phone]* (retail)       [address]       [place], [place]  [address]       Ph: [phone] or [phone]       Fax: [phone]   RxID:   [id number]    Medication Administration  Injection # 1:    Medication: Vit B12 1000 mcg    Diagnosis: ANEMIA, B12 DEFICIENCY (ICD-281.1)    Route: IM    Site: L deltoid    Exp Date: 05/2012    Lot #: [id number]    Mfr: American Regent    Patient  tolerated injection without complications    Given by: Brenton Grills CMA Duncan Dull) (October 01, 2010 1:37 PM)  Orders Added: 1)  Orthopedic Referral [Ortho] 2)  Vit B12 1000 mcg [J3420] 3)  Admin of Therapeutic Inj  intramuscular or subcutaneous [96372] 4)  Est. Patient Level IV [36644]

## 2010-11-06 NOTE — Progress Notes (Signed)
Summary: Supplements?  Phone Note Call from Patient Call back at [facility] Phone [phone]   Summary of Call: Pt is currently taking magnesium & iron supplements. Should she continue?  Initial call taken by: Lamar Sprinkles, CMA,  September 23, 2010 11:58 AM  Follow-up for Phone Call        sure Follow-up by: Etta Grandchild MD,  September 23, 2010 6:48 PM  Additional Follow-up for Phone Call Additional follow up Details #1::        Left vm for pt Additional Follow-up by: Lamar Sprinkles, CMA,  September 24, 2010 9:38 AM

## 2010-11-06 NOTE — Progress Notes (Signed)
Summary: Call - med help  Phone Note Call from Patient Call back at [facility] Phone [phone]   Summary of Call: Patient is requesting a call back. Has a problem getting her medication and req a call back to see if our office can help.  Initial call taken by: Lamar Sprinkles, CMA,  October 27, 2010 2:38 PM  Follow-up for Phone Call        Pt states that she is unable to afford Neurontin at this time and asked if there were any samples available. Informed pt that since drug is available in a generic version that we do not have any samples. Follow-up by: Brenton Grills CMA Duncan Dull),  October 28, 2010 8:58 AM

## 2010-11-06 NOTE — Progress Notes (Signed)
Summary: CALL  Phone Note Call from Patient Call back at [facility] Phone [phone]   Summary of Call: Pt req a call regarding mamogram Initial call taken by: Lamar Sprinkles, CMA,  September 30, 2010 11:49 AM  Follow-up for Phone Call        Pt informed that we have mamogram results. She will get letter in the mail. Pt says she got skin tear during exam and was not happy w/her treatment. Pt also req rx for sinus infection, advised office visit, scheduled for office visit tomorrow Follow-up by: Lamar Sprinkles, CMA,  September 30, 2010 6:07 PM

## 2010-11-06 NOTE — Progress Notes (Signed)
Summary: appt  Phone Note Outgoing Call   Summary of Call: LA- her B12 level is low, please ask her to come in asap for a B12 injection. TJ Initial call taken by: Etta Grandchild MD,  September 11, 2010 5:23 PM  Follow-up for Phone Call        Please set pt up with appt. Thanks.Alvy Beal Archie CMA  September 17, 2010 9:23 AM   Additional Follow-up for Phone Call Additional follow up Details #1::        left message for pt to c/b to sched b12 shot. Additional Follow-up by: Verdell Face,  September 22, 2010 10:15 AM    Additional Follow-up for Phone Call Additional follow up Details #2::    pt set up b12 shot for 12/20. Follow-up by: Verdell Face,  September 22, 2010 11:02 AM

## 2010-11-06 NOTE — Assessment & Plan Note (Signed)
Summary: b12 shot/per TLJ asap/cd--coming at 1:30pm  Nurse Visit   Allergies: 1)  ! Lisinopril  Medication Administration  Injection # 1:    Medication: Vit B12 1000 mcg    Diagnosis: ANEMIA-IRON DEFICIENCY (ICD-280.9)    Route: IM    Site: L deltoid    Exp Date: 05/05/2012    Lot #: [id number]    Mfr: American Regent    Patient tolerated injection without complications    Given by: Margaret Pyle, CMA (September 23, 2010 12:59 PM)  Orders Added: 1)  Admin of Therapeutic Inj  intramuscular or subcutaneous [96372] 2)  Vit B12 1000 mcg [J3420]

## 2010-11-07 ENCOUNTER — Ambulatory Visit: Admit: 2010-11-07 | Payer: Self-pay | Admitting: Internal Medicine

## 2010-11-07 ENCOUNTER — Ambulatory Visit: Payer: Self-pay

## 2010-11-10 ENCOUNTER — Ambulatory Visit: Payer: Self-pay | Admitting: Internal Medicine

## 2010-11-20 NOTE — Letter (Signed)
Summary: [facility]  [facility]   Imported By: Sherian Rein 11/13/2010 15:09:03  _____________________________________________________________________  External Attachment:    Type:   Image     Comment:   External Document

## 2010-11-20 NOTE — Letter (Signed)
Summary: [facility]  [facility]   Imported By: Sherian Rein 11/13/2010 15:07:20  _____________________________________________________________________  External Attachment:    Type:   Image     Comment:   External Document

## 2010-12-01 ENCOUNTER — Emergency Department (HOSPITAL_COMMUNITY): Payer: Medicare Other

## 2010-12-01 ENCOUNTER — Emergency Department (HOSPITAL_COMMUNITY)
Admission: EM | Admit: 2010-12-01 | Discharge: 2010-12-01 | Disposition: A | Discharge status: Home / Self Care | Payer: Medicare Other | Attending: Emergency Medicine | Admitting: Emergency Medicine

## 2010-12-01 DIAGNOSIS — J40 Bronchitis, not specified as acute or chronic: Secondary | ICD-10-CM | POA: Insufficient documentation

## 2010-12-01 DIAGNOSIS — R05 Cough: Secondary | ICD-10-CM | POA: Insufficient documentation

## 2010-12-01 DIAGNOSIS — R0602 Shortness of breath: Secondary | ICD-10-CM | POA: Insufficient documentation

## 2010-12-01 DIAGNOSIS — R079 Chest pain, unspecified: Secondary | ICD-10-CM | POA: Insufficient documentation

## 2010-12-01 DIAGNOSIS — I10 Essential (primary) hypertension: Secondary | ICD-10-CM | POA: Insufficient documentation

## 2010-12-01 DIAGNOSIS — Z794 Long term (current) use of insulin: Secondary | ICD-10-CM | POA: Insufficient documentation

## 2010-12-01 DIAGNOSIS — R059 Cough, unspecified: Secondary | ICD-10-CM | POA: Insufficient documentation

## 2010-12-01 DIAGNOSIS — E119 Type 2 diabetes mellitus without complications: Secondary | ICD-10-CM | POA: Insufficient documentation

## 2010-12-01 LAB — POCT I-STAT, CHEM 8
BUN: 6 mg/dL (ref 6–23)
Calcium, Ion: 1.17 mmol/L (ref 1.12–1.32)
Chloride: 98 mEq/L (ref 96–112)
Creatinine, Ser: 0.8 mg/dL (ref 0.4–1.2)
Glucose, Bld: 321 mg/dL — ABNORMAL HIGH (ref 70–99)
HCT: 35 % — ABNORMAL LOW (ref 36.0–46.0)

## 2010-12-01 LAB — URINALYSIS, ROUTINE W REFLEX MICROSCOPIC
Bilirubin Urine: NEGATIVE
Hgb urine dipstick: NEGATIVE
Ketones, ur: NEGATIVE mg/dL
Urine Glucose, Fasting: >1000 mg/dL — AB
pH: 7 (ref 5.0–8.0)

## 2010-12-01 LAB — URINE MICROSCOPIC-ADD ON

## 2010-12-01 LAB — LITHIUM LEVEL: Lithium Lvl: 0.49 mEq/L — ABNORMAL LOW (ref 0.80–1.40)

## 2010-12-02 LAB — URINE CULTURE

## 2010-12-03 ENCOUNTER — Ambulatory Visit: Payer: Self-pay | Admitting: Internal Medicine

## 2010-12-08 ENCOUNTER — Other Ambulatory Visit: Payer: Self-pay | Admitting: Internal Medicine

## 2010-12-08 ENCOUNTER — Encounter: Payer: Self-pay | Admitting: Internal Medicine

## 2010-12-08 ENCOUNTER — Other Ambulatory Visit: Payer: Medicare Other

## 2010-12-08 ENCOUNTER — Ambulatory Visit (INDEPENDENT_AMBULATORY_CARE_PROVIDER_SITE_OTHER): Payer: Medicare Other | Admitting: Internal Medicine

## 2010-12-08 DIAGNOSIS — E8881 Metabolic syndrome: Secondary | ICD-10-CM

## 2010-12-08 DIAGNOSIS — F17200 Nicotine dependence, unspecified, uncomplicated: Secondary | ICD-10-CM

## 2010-12-08 DIAGNOSIS — F3289 Other specified depressive episodes: Secondary | ICD-10-CM

## 2010-12-08 DIAGNOSIS — F329 Major depressive disorder, single episode, unspecified: Secondary | ICD-10-CM

## 2010-12-08 DIAGNOSIS — E119 Type 2 diabetes mellitus without complications: Secondary | ICD-10-CM

## 2010-12-08 DIAGNOSIS — D509 Iron deficiency anemia, unspecified: Secondary | ICD-10-CM

## 2010-12-08 DIAGNOSIS — D518 Other vitamin B12 deficiency anemias: Secondary | ICD-10-CM

## 2010-12-08 DIAGNOSIS — IMO0002 Reserved for concepts with insufficient information to code with codable children: Secondary | ICD-10-CM

## 2010-12-08 DIAGNOSIS — E1169 Type 2 diabetes mellitus with other specified complication: Secondary | ICD-10-CM

## 2010-12-08 DIAGNOSIS — E785 Hyperlipidemia, unspecified: Secondary | ICD-10-CM

## 2010-12-08 DIAGNOSIS — I10 Essential (primary) hypertension: Secondary | ICD-10-CM

## 2010-12-08 LAB — URINALYSIS, ROUTINE W REFLEX MICROSCOPIC
Bilirubin Urine: NEGATIVE
Ketones, ur: NEGATIVE
Leukocytes, UA: NEGATIVE
Specific Gravity, Urine: 1.01 (ref 1.000–1.030)
Total Protein, Urine: NEGATIVE
Urine Glucose: >=1000
pH: 6 (ref 5.0–8.0)

## 2010-12-08 LAB — LIPID PANEL
HDL: 35.3 mg/dL — ABNORMAL LOW (ref >39.00)
Triglycerides: 390 mg/dL — ABNORMAL HIGH (ref 0.0–149.0)
VLDL: 78 mg/dL — ABNORMAL HIGH (ref 0.0–40.0)

## 2010-12-08 LAB — HEMOGLOBIN A1C: Hgb A1c MFr Bld: 10.8 % — ABNORMAL HIGH (ref 4.6–6.5)

## 2010-12-08 LAB — CBC WITH DIFFERENTIAL/PLATELET
Basophils Absolute: 0.1 10*3/uL (ref 0.0–0.1)
Eosinophils Relative: 2.3 % (ref 0.0–5.0)
HCT: 33.9 % — ABNORMAL LOW (ref 36.0–46.0)
Hemoglobin: 11.4 g/dL — ABNORMAL LOW (ref 12.0–15.0)
Lymphocytes Relative: 24.6 % (ref 12.0–46.0)
Lymphs Abs: 3 10*3/uL (ref 0.7–4.0)
Monocytes Relative: 4.5 % (ref 3.0–12.0)
Platelets: 411 10*3/uL — ABNORMAL HIGH (ref 150.0–400.0)
WBC: 12.2 10*3/uL — ABNORMAL HIGH (ref 4.5–10.5)

## 2010-12-08 LAB — BASIC METABOLIC PANEL
BUN: 7 mg/dL (ref 6–23)
Calcium: 9.3 mg/dL (ref 8.4–10.5)
GFR: 74.75 mL/min (ref >60.00)
Potassium: 4.4 mEq/L (ref 3.5–5.1)
Sodium: 133 mEq/L — ABNORMAL LOW (ref 135–145)

## 2010-12-08 LAB — CONVERTED CEMR LAB
Cholesterol, target level: 200 mg/dL
Lithium Lvl: 0.45 meq/L — ABNORMAL LOW (ref 0.80–1.40)

## 2010-12-08 LAB — LDL CHOLESTEROL, DIRECT: Direct LDL: 90.5 mg/dL

## 2010-12-08 LAB — HEPATIC FUNCTION PANEL
AST: 15 U/L (ref 0–37)
Albumin: 3.6 g/dL (ref 3.5–5.2)
Alkaline Phosphatase: 96 U/L (ref 39–117)
Bilirubin, Direct: 0.1 mg/dL (ref 0.0–0.3)

## 2010-12-08 LAB — TSH: TSH: 1.1 u[IU]/mL (ref 0.35–5.50)

## 2010-12-08 LAB — HM DIABETES FOOT EXAM

## 2010-12-11 ENCOUNTER — Telehealth: Payer: Self-pay | Admitting: Internal Medicine

## 2010-12-16 NOTE — Letter (Signed)
Summary: Results Follow-up Letter  [facility]  [address] [place], [place] [address]   Phone: [phone]  Fax: [phone]    12/08/2010  [address] [place], [place]  [address]  [place]  Dear Ms. Biber,   The following are the results of your recent test(s):  Test     Result     CBC       High WBC and anemia Blood sugar     very high Liver/kidney   normal Thyroid     normal Urine       normal   _________________________________________________________  Please call for an appointment as directed _________________________________________________________ _________________________________________________________ _________________________________________________________  Sincerely,  Sanda Linger MD [facility]

## 2010-12-16 NOTE — Progress Notes (Signed)
Summary: LEVEMIR NOT COVERED  Phone Note Call from Patient   Summary of Call: Pt left vm - ? needs PA on levemir.  Initial call taken by: Lamar Sprinkles, CMA,  December 11, 2010 11:17 AM  Follow-up for Phone Call        Pt aware of samples. Spoke w/pt's insurance plan - Lantus is preferred. OK to change to lantus per MD, pt is aware  Pt had previously been on levemir 60u two times a day, says at office visit pt was advised to increase to 100 u two times a day. What dose is ok for new rx for lantus?  Follow-up by: Lamar Sprinkles, CMA,  December 11, 2010 11:45 AM    New/Updated Medications: LANTUS SOLOSTAR 100 UNIT/ML SOLN (INSULIN GLARGINE) 100 units subcutaneously once daily Prescriptions: LANTUS SOLOSTAR 100 UNIT/ML SOLN (INSULIN GLARGINE) 100 units subcutaneously once daily  #1 month x 11   Entered and Authorized by:   Etta Grandchild MD   Signed by:   Etta Grandchild MD on 12/11/2010   Method used:   [facility]        [facility] [address] [phone]* (retail)       [address]       [place], [place]  [address]       Ph: [phone] or [phone]       Fax: [phone]   RxID:   [id number] LEVEMIR 100 UNIT/ML SOLN (INSULIN DETEMIR) 60 units daily  #2pens x 0   Entered by:   Lamar Sprinkles, CMA   Authorized by:   Etta Grandchild MD   Signed by:   Lamar Sprinkles, CMA on 12/11/2010   Method used:   Samples Given   RxID:   [id number]

## 2010-12-16 NOTE — Assessment & Plan Note (Signed)
Summary: FU / LB/NWS   Vital Signs:  Patient profile:   50 year old female Menstrual status:  irregular LMP:     11/25/2010 Height:      66 inches Weight:      297.50 pounds O2 Sat:      96 % on Room air Temp:     98.9 degrees F oral Pulse rate:   80 / minute Pulse rhythm:   regular Resp:     16 per minute BP sitting:   142 / 68  Vitals Entered By: Rock Nephew CMA (December 08, 2010 1:45 PM)  O2 Flow:  Room air  Primary Care Provider:  Etta Grandchild MD   History of Present Illness:  Follow-Up Visit      This is a 50 year old woman who presents for Follow-up visit.  The patient complains of high blood sugar symptoms, but denies chest pain, palpitations, dizziness, syncope, low blood sugar symptoms, edema, SOB, DOE, PND, and orthopnea.  Since the last visit the patient notes no new problems or concerns.  The patient reports taking meds as prescribed, monitoring BP, monitoring blood sugars, and dietary noncompliance.  When questioned about possible medication side effects, the patient notes none.    Dyspepsia History:      There is a prior history of GERD.  The patient has a prior history of documented ulcer disease.  The dominant symptom is heartburn or acid reflux.  An H-2 blocker medication is currently being taken.  She notes that the symptoms have improved with the H-2 blocker therapy.  Symptoms have not persisted after 4 weeks of H-2 blocker treatment.    Hypertension History:      She denies headache, chest pain, palpitations, dyspnea with exertion, orthopnea, PND, peripheral edema, visual symptoms, neurologic problems, syncope, and side effects from treatment.  She notes no problems with any antihypertensive medication side effects.        Positive major cardiovascular risk factors include diabetes, hyperlipidemia, hypertension, and current tobacco user.  Negative major cardiovascular risk factors include female age less than 66 years old.        Further assessment for  target organ damage reveals no history of ASHD, cardiac end-organ damage (CHF/LVH), stroke/TIA, peripheral vascular disease, renal insufficiency, or hypertensive retinopathy.    Lipid Management History:      Positive NCEP/ATP III risk factors include diabetes, HDL cholesterol less than 40, current tobacco user, and hypertension.  Negative NCEP/ATP III risk factors include female age less than 8 years old, no ASHD (atherosclerotic heart disease), no prior stroke/TIA, no peripheral vascular disease, and no history of aortic aneurysm.        The patient states that she knows about the "Therapeutic Lifestyle Change" diet.  Her compliance with the TLC diet is not at all.  The patient expresses understanding of adjunctive measures for cholesterol lowering.  Adjunctive measures started by the patient include fiber and limit alcohol consumpton.  She expresses no side effects from her lipid-lowering medication.  The patient denies any symptoms to suggest myopathy or liver disease.      Current Medications (verified): 1)  Lithium Carbonate 300 Mg Caps (Lithium Carbonate) .... Take 2 Tabs Two Times A Day 2)  Clonazepam 1 Mg Tabs (Clonazepam) .... Take 1 Tablet By Mouth Two Times A Day 3)  Omeprazole 20 Mg Tbec (Omeprazole) .... Take 1 Tablet By Mouth Once A Day 4)  Gabapentin 300 Mg Caps (Gabapentin) .... Take 2 Tabs Two Times  A Day 5)  Metformin Hcl 1000 Mg Tabs (Metformin Hcl) .... Take 1 Tablet By Mouth Two Times A Day 6)  Magnesium 250 Mg Tabs (Magnesium) .... Take 1 Tablet By Mouth Once A Day 7)  Trazodone Hcl 50 Mg Tabs (Trazodone Hcl) .... Take 1 Tab By Mouth At Bedtime 8)  Topiramate 25 Mg Tabs (Topiramate) .... Take 1 Tab Qam and 2 Tabs Qhs 9)  Cymbalta 60 Mg Cpep (Duloxetine Hcl) .... Take 1 Tablet By Mouth Two Times A Day 10)  Buspirone Hcl 15 Mg Tabs (Buspirone Hcl) .... Take 1 Tablet By Mouth Three Times A Day 11)  Levemir 100 Unit/ml Soln (Insulin Detemir) .... 40units Daily 12)  Novolog  Flexpen 100 Unit/ml Soln (Insulin Aspart) .... As Directed Sliding Scale 13)  Diovan 160 Mg Tabs (Valsartan) .... One By Mouth Once Daily For High Blood Pressure 14)  Risperidone  Allergies (verified): 1)  ! Lisinopril  Past History:  Past Medical History: Last updated: 09/11/2010 Anemia-iron deficiency Anxiety Depression Diabetes mellitus, type II Hyperlipidemia Hypertension Peptic ulcer disease Colonic polyps, hx of Headache GERD  Past Surgical History: Last updated: 09/11/2010 Denies surgical history  Family History: Last updated: 09/11/2010 Family History of Alcoholism/Addiction Family History of Arthritis Family History Breast cancer 1st degree relative <50 Family History of Colon CA 1st degree relative <60 Family History Depression Family History Diabetes 1st degree relative Family History Hypertension Family History Kidney disease Family History Lung cancer Family History of Prostate CA 1st degree relative <50  Social History: Last updated: 09/11/2010 Divorced/disabled/homeless Current Smoker Alcohol use-no Drug use-no Regular exercise-no  Risk Factors: Alcohol Use: 0 (10/01/2010) Exercise: no (09/11/2010)  Risk Factors: Smoking Status: current (10/01/2010) Packs/Day: 1.0 (10/01/2010)  Family History: Reviewed history from 09/11/2010 and no changes required. Family History of Alcoholism/Addiction Family History of Arthritis Family History Breast cancer 1st degree relative <50 Family History of Colon CA 1st degree relative <60 Family History Depression Family History Diabetes 1st degree relative Family History Hypertension Family History Kidney disease Family History Lung cancer Family History of Prostate CA 1st degree relative <50  Social History: Reviewed history from 09/11/2010 and no changes required. Divorced/disabled/homeless Current Smoker Alcohol use-no Drug use-no Regular exercise-no  Review of Systems       The patient  complains of weight gain.  The patient denies anorexia, fever, weight loss, chest pain, syncope, dyspnea on exertion, peripheral edema, prolonged cough, headaches, hemoptysis, abdominal pain, hematuria, suspicious skin lesions, depression, unusual weight change, abnormal bleeding, enlarged lymph nodes, and angioedema.   Psych:  Complains of anxiety and depression; denies easily angered, easily tearful, irritability, mental problems, panic attacks, sense of great danger, suicidal thoughts/plans, thoughts of violence, unusual visions or sounds, and thoughts /plans of harming others. Endo:  Complains of excessive thirst, polyuria, and weight change; denies cold intolerance, excessive hunger, excessive urination, and heat intolerance.  Physical Exam  General:  alert, well-developed, well-nourished, well-hydrated, appropriate dress, normal appearance, cooperative to examination, good hygiene, and overweight-appearing.   Head:  normocephalic, atraumatic, no abnormalities observed, and no abnormalities palpated.   Mouth:  Oral mucosa and oropharynx without lesions or exudates.  Teeth in good repair. Neck:  supple, full ROM, no masses, no thyromegaly, no thyroid nodules or tenderness, no JVD, normal carotid upstroke, no carotid bruits, no cervical lymphadenopathy, and no neck tenderness.   Lungs:  Normal respiratory effort, chest expands symmetrically. Lungs are clear to auscultation, no crackles or wheezes. Heart:  Normal rate and regular rhythm. S1 and S2  normal without gallop, murmur, click, rub or other extra sounds. Abdomen:  soft, non-tender, normal bowel sounds, no distention, no masses, no guarding, no rigidity, no rebound tenderness, no abdominal hernia, no inguinal hernia, no hepatomegaly, and no splenomegaly.   Msk:  normal ROM, no joint tenderness, no joint swelling, no joint warmth, no redness over joints, no joint deformities, no joint instability, and no crepitation.   Pulses:  R and L  carotid,radial,femoral,dorsalis pedis and posterior tibial pulses are full and equal bilaterally Extremities:  No clubbing, cyanosis, edema, or deformity noted with normal full range of motion of all joints.   Neurologic:  No cranial nerve deficits noted. Station and gait are normal. Plantar reflexes are down-going bilaterally. DTRs are symmetrical throughout. Sensory, motor and coordinative functions appear intact. Skin:  Intact without suspicious lesions or rashes Cervical Nodes:  No lymphadenopathy noted Psych:  Cognition and judgment appear intact. Alert and cooperative with normal attention span and concentration. No apparent delusions, illusions, hallucinations  Diabetes Management Exam:    Foot Exam (with socks and/or shoes not present):       Sensory-Pinprick/Light touch:          Left medial foot (L-4): normal          Left dorsal foot (L-5): normal          Left lateral foot (S-1): normal          Right medial foot (L-4): normal          Right dorsal foot (L-5): normal          Right lateral foot (S-1): normal       Sensory-Monofilament:          Left foot: normal          Right foot: normal       Inspection:          Left foot: normal          Right foot: normal       Nails:          Left foot: normal          Right foot: normal   Impression & Recommendations:  Problem # 1:  DIABETES MELLITUS, TYPE II (ICD-250.00) Assessment Deteriorated  Her updated medication list for this problem includes:    Metformin Hcl 1000 Mg Tabs (Metformin hcl) .Marland Kitchen... Take 1 tablet by mouth two times a day    Levemir 100 Unit/ml Soln (Insulin detemir) .Marland KitchenMarland KitchenMarland KitchenMarland Kitchen 60 units daily    Novolog Flexpen 100 Unit/ml Soln (Insulin aspart) .Marland Kitchen... As directed sliding scale    Diovan 160 Mg Tabs (Valsartan) ..... One by mouth once daily for high blood pressure    Januvia 100 Mg Tabs (Sitagliptin phosphate) ..... One by mouth once daily for diabetes  Orders: Venipuncture (16109) TLB-Lipid Panel  (80061-LIPID) TLB-BMP (Basic Metabolic Panel-BMET) (80048-METABOL) TLB-CBC Platelet - w/Differential (85025-CBCD) TLB-TSH (Thyroid Stimulating Hormone) (84443-TSH) TLB-Hepatic/Liver Function Pnl (80076-HEPATIC) TLB-Udip w/ Micro (81001-URINE) TLB-A1C / Hgb A1C (Glycohemoglobin) (83036-A1C) T-Lithium Level (60454-09811) Diabetic Clinic Referral (Diabetic) Nutrition Referral (Nutrition)  Labs Reviewed: Creat: 0.8 (09/11/2010)    Reviewed HgBA1c results: 8.6 (09/11/2010)  Problem # 2:  HYPERTENSION (ICD-401.9) Assessment: Improved  Her updated medication list for this problem includes:    Diovan 160 Mg Tabs (Valsartan) ..... One by mouth once daily for high blood pressure  Orders: Venipuncture (91478) TLB-Lipid Panel (80061-LIPID) TLB-BMP (Basic Metabolic Panel-BMET) (80048-METABOL) TLB-CBC Platelet - w/Differential (85025-CBCD) TLB-TSH (Thyroid Stimulating Hormone) (84443-TSH) TLB-Hepatic/Liver Function Pnl (  80076-HEPATIC) TLB-Udip w/ Micro (81001-URINE) TLB-A1C / Hgb A1C (Glycohemoglobin) (83036-A1C) T-Lithium Level (69629-52841)  BP today: 142/68 Prior BP: 132/70 (10/01/2010)  Prior 10 Yr Risk Heart Disease: Not enough information (09/11/2010)  Labs Reviewed: K+: 4.7 (09/11/2010) Creat: : 0.8 (09/11/2010)   Chol: 149 (09/11/2010)   HDL: 37.10 (09/11/2010)   TG: 211.0 (09/11/2010)  Problem # 3:  ANEMIA-IRON DEFICIENCY (ICD-280.9) Assessment: Unchanged  Orders: Venipuncture (32440) TLB-Lipid Panel (80061-LIPID) TLB-BMP (Basic Metabolic Panel-BMET) (80048-METABOL) TLB-CBC Platelet - w/Differential (85025-CBCD) TLB-TSH (Thyroid Stimulating Hormone) (84443-TSH) TLB-Hepatic/Liver Function Pnl (80076-HEPATIC) TLB-Udip w/ Micro (81001-URINE) TLB-A1C / Hgb A1C (Glycohemoglobin) (83036-A1C) T-Lithium Level (10272-53664) Diabetic Clinic Referral (Diabetic) Nutrition Referral (Nutrition)  Problem # 4:  HYPERLIPIDEMIA (ICD-272.4) Assessment: Unchanged  Her updated  medication list for this problem includes:    Crestor 20 Mg Tabs (Rosuvastatin calcium) ..... One by mouth once daily for cholesterol  Orders: Venipuncture (40347) TLB-Lipid Panel (80061-LIPID) TLB-BMP (Basic Metabolic Panel-BMET) (80048-METABOL) TLB-CBC Platelet - w/Differential (85025-CBCD) TLB-TSH (Thyroid Stimulating Hormone) (84443-TSH) TLB-Hepatic/Liver Function Pnl (80076-HEPATIC) TLB-Udip w/ Micro (81001-URINE) TLB-A1C / Hgb A1C (Glycohemoglobin) (83036-A1C) T-Lithium Level (42595-63875)  Labs Reviewed: SGOT: 22 (09/11/2010)   SGPT: 14 (09/11/2010)  Prior 10 Yr Risk Heart Disease: Not enough information (09/11/2010)   HDL:37.10 (09/11/2010)  Chol:149 (09/11/2010)  Trig:211.0 (09/11/2010)  Problem # 5:  GERD (ICD-530.81) Assessment: Unchanged  Her updated medication list for this problem includes:    Omeprazole 20 Mg Tbec (Omeprazole) .Marland Kitchen... Take 1 tablet by mouth once a day  Complete Medication List: 1)  Lithium Carbonate 300 Mg Caps (Lithium carbonate) .... Take 2 tabs two times a day 2)  Clonazepam 1 Mg Tabs (Clonazepam) .... Take 1 tablet by mouth two times a day 3)  Omeprazole 20 Mg Tbec (Omeprazole) .... Take 1 tablet by mouth once a day 4)  Gabapentin 300 Mg Caps (Gabapentin) .... Take 2 tabs two times a day 5)  Metformin Hcl 1000 Mg Tabs (Metformin hcl) .... Take 1 tablet by mouth two times a day 6)  Trazodone Hcl 50 Mg Tabs (Trazodone hcl) .... Take 1 tab by mouth at bedtime 7)  Cymbalta 60 Mg Cpep (Duloxetine hcl) .... Take 1 tablet by mouth two times a day 8)  Levemir 100 Unit/ml Soln (Insulin detemir) .... 60 units daily 9)  Novolog Flexpen 100 Unit/ml Soln (Insulin aspart) .... As directed sliding scale 10)  Diovan 160 Mg Tabs (Valsartan) .... One by mouth once daily for high blood pressure 11)  Risperidone  12)  Januvia 100 Mg Tabs (Sitagliptin phosphate) .... One by mouth once daily for diabetes 13)  Crestor 20 Mg Tabs (Rosuvastatin calcium) .... One by  mouth once daily for cholesterol  Other Orders: Admin of Therapeutic Inj  intramuscular or subcutaneous (64332) Vit B12 1000 mcg (R5188)  Hypertension Assessment/Plan:      The patient's hypertensive risk group is category C: Target organ damage and/or diabetes.  Today's blood pressure is 142/68.  Her blood pressure goal is < 130/80.  Lipid Assessment/Plan:      Based on NCEP/ATP III, the patient's risk factor category is "history of diabetes".  The patient's lipid goals are as follows: Total cholesterol goal is 200; LDL cholesterol goal is 100; HDL cholesterol goal is 40; Triglyceride goal is 150.     Patient Instructions: 1)  Please schedule a follow-up appointment in 2 months. 2)  It is important that you exercise regularly at least 20 minutes 5 times a week. If you develop chest pain, have severe difficulty  breathing, or feel very tired , stop exercising immediately and seek medical attention. 3)  You need to lose weight. Consider a lower calorie diet and regular exercise.  4)  Check your blood sugars regularly. If your readings are usually above 200 or below 70 you should contact our office. 5)  It is important that your Diabetic A1c level is checked every 3 months. 6)  See your eye doctor yearly to check for diabetic eye damage. 7)  Check your feet each night for sore areas, calluses or signs of infection. 8)  Check your Blood Pressure regularly. If it is above 130/80: you should make an appointment. Prescriptions: LEVEMIR 100 UNIT/ML SOLN (INSULIN DETEMIR) 60 units daily  #1 month x 11   Entered and Authorized by:   Etta Grandchild MD   Signed by:   Etta Grandchild MD on 12/08/2010   Method used:   Electronically to        [facility] [address] [phone]* (retail)       [address] [place], [place]  [address]       Ph: [phone]       Fax: [phone]   RxID:   [id number] METFORMIN HCL 1000 MG TABS (METFORMIN HCL) Take 1 tablet by mouth two times a day  #60  x 11   Entered and Authorized by:   Etta Grandchild MD   Signed by:   Etta Grandchild MD on 12/08/2010   Method used:   Electronically to        [facility] [address] [phone]* (retail)       [address] [place], [place]  [address]       Ph: [phone]       Fax: [phone]   RxID:   [id number] GABAPENTIN 300 MG CAPS (GABAPENTIN) Take 2 tabs two times a day  #120 x 11   Entered and Authorized by:   Etta Grandchild MD   Signed by:   Etta Grandchild MD on 12/08/2010   Method used:   Electronically to        [facility] [address] [phone]* (retail)       [address] [place], [place]  [address]       Ph: [phone]       Fax: [phone]   RxID:   [id number] OMEPRAZOLE 20 MG TBEC (OMEPRAZOLE) Take 1 tablet by mouth once a day  #30 x 11   Entered and Authorized by:   Etta Grandchild MD   Signed by:   Etta Grandchild MD on 12/08/2010   Method used:   Electronically to        [facility] [address] [phone]* (retail)       [address] [place], [place]  [address]       Ph: [phone]       Fax: [phone]   RxID:   [id number] DIOVAN 160 MG TABS (VALSARTAN) One by mouth once daily for high blood pressure  #84 x 0   Entered and Authorized by:   Etta Grandchild MD   Signed by:   Etta Grandchild MD on 12/08/2010   Method used:   Samples Given   RxID:   [id number] CRESTOR 20 MG TABS (ROSUVASTATIN CALCIUM) One by mouth once daily for cholesterol  #70 x  0   Entered and Authorized by:   Etta Grandchild MD   Signed by:   Etta Grandchild MD on 12/08/2010   Method used:   Samples Given   RxID:   [id number] JANUVIA 100 MG TABS (SITAGLIPTIN PHOSPHATE) One by mouth once daily for diabetes  #140 x 0   Entered and Authorized by:   Etta Grandchild MD   Signed by:   Etta Grandchild MD on 12/08/2010   Method used:   Samples Given   RxID:   [id number]    Medication Administration  Injection # 1:    Medication: Vit  B12 1000 mcg    Diagnosis: ANEMIA, B12 DEFICIENCY (ICD-281.1)    Route: IM    Site: L deltoid    Exp Date: 08/2012    Lot #: [id number]    Mfr: American Regent    Patient tolerated injection without complications    Given by: Rock Nephew CMA (December 08, 2010 1:51 PM)  Orders Added: 1)  Admin of Therapeutic Inj  intramuscular or subcutaneous [96372] 2)  Vit B12 1000 mcg [J3420] 3)  Venipuncture [36415] 4)  TLB-Lipid Panel [80061-LIPID] 5)  TLB-BMP (Basic Metabolic Panel-BMET) [80048-METABOL] 6)  TLB-CBC Platelet - w/Differential [85025-CBCD] 7)  TLB-TSH (Thyroid Stimulating Hormone) [84443-TSH] 8)  TLB-Hepatic/Liver Function Pnl [80076-HEPATIC] 9)  TLB-Udip w/ Micro [81001-URINE] 10)  TLB-A1C / Hgb A1C (Glycohemoglobin) [83036-A1C] 11)  T-Lithium Level [80178-23440] 12)  Diabetic Clinic Referral [Diabetic] 13)  Nutrition Referral [Nutrition] 14)  Est. Patient Level IV [84696]     Medication Administration  Injection # 1:    Medication: Vit B12 1000 mcg    Diagnosis: ANEMIA, B12 DEFICIENCY (ICD-281.1)    Route: IM    Site: L deltoid    Exp Date: 08/2012    Lot #: [id number]    Mfr: American Regent    Patient tolerated injection without complications    Given by: Rock Nephew CMA (December 08, 2010 1:51 PM)  Orders Added: 1)  Admin of Therapeutic Inj  intramuscular or subcutaneous [96372] 2)  Vit B12 1000 mcg [J3420] 3)  Venipuncture [36415] 4)  TLB-Lipid Panel [80061-LIPID] 5)  TLB-BMP (Basic Metabolic Panel-BMET) [80048-METABOL] 6)  TLB-CBC Platelet - w/Differential [85025-CBCD] 7)  TLB-TSH (Thyroid Stimulating Hormone) [84443-TSH] 8)  TLB-Hepatic/Liver Function Pnl [80076-HEPATIC] 9)  TLB-Udip w/ Micro [81001-URINE] 10)  TLB-A1C / Hgb A1C (Glycohemoglobin) [83036-A1C] 11)  T-Lithium Level [80178-23440] 12)  Diabetic Clinic Referral [Diabetic] 13)  Nutrition Referral [Nutrition] 14)  Est. Patient Level IV [29528]

## 2010-12-18 LAB — DIFFERENTIAL
Basophils Absolute: 0 10*3/uL (ref 0.0–0.1)
Eosinophils Absolute: 0.5 10*3/uL (ref 0.0–0.7)
Eosinophils Relative: 5 % (ref 0–5)
Neutrophils Relative %: 62 % (ref 43–77)

## 2010-12-18 LAB — CBC
HCT: 31.6 % — ABNORMAL LOW (ref 36.0–46.0)
Hemoglobin: 9.7 g/dL — ABNORMAL LOW (ref 12.0–15.0)
MCH: 24.8 pg — ABNORMAL LOW (ref 26.0–34.0)
MCHC: 30.7 g/dL (ref 30.0–36.0)
RBC: 3.91 MIL/uL (ref 3.87–5.11)

## 2010-12-18 LAB — COMPREHENSIVE METABOLIC PANEL
ALT: 22 U/L (ref 0–35)
AST: 39 U/L — ABNORMAL HIGH (ref 0–37)
CO2: 28 mEq/L (ref 19–32)
Chloride: 102 mEq/L (ref 96–112)
Creatinine, Ser: 0.76 mg/dL (ref 0.4–1.2)
GFR calc Af Amer: >60 mL/min (ref >60)
GFR calc non Af Amer: >60 mL/min (ref >60)
Glucose, Bld: 281 mg/dL — ABNORMAL HIGH (ref 70–99)
Total Bilirubin: 0.5 mg/dL (ref 0.3–1.2)

## 2010-12-18 LAB — URINALYSIS, ROUTINE W REFLEX MICROSCOPIC
Glucose, UA: >1000 mg/dL — AB
Hgb urine dipstick: NEGATIVE
Specific Gravity, Urine: 1.021 (ref 1.005–1.030)
pH: 6.5 (ref 5.0–8.0)

## 2010-12-18 LAB — GLUCOSE, CAPILLARY: Glucose-Capillary: 296 mg/dL — ABNORMAL HIGH (ref 70–99)

## 2010-12-18 LAB — URINE MICROSCOPIC-ADD ON

## 2010-12-18 LAB — PROTIME-INR
INR: 0.97 (ref 0.00–1.49)
Prothrombin Time: 13.1 seconds (ref 11.6–15.2)

## 2010-12-18 LAB — POCT CARDIAC MARKERS: Troponin i, poc: <0.05 ng/mL (ref 0.00–0.09)

## 2011-02-09 ENCOUNTER — Telehealth: Payer: Self-pay | Admitting: *Deleted

## 2011-02-09 NOTE — Telephone Encounter (Signed)
Patient requesting a call back regarding med questions.

## 2011-02-09 NOTE — Telephone Encounter (Signed)
Spoke w/pt 1. Needs lantus solostar and novolog sent to [facility] 2. Needs samples of any meds she is on and pen needles & insulin syringes  Advised her I would get what I had together and she could pick up anytime after 3pm tomorrow.

## 2011-02-10 ENCOUNTER — Other Ambulatory Visit: Payer: Self-pay | Admitting: *Deleted

## 2011-02-10 MED ORDER — INSULIN GLARGINE 100 UNIT/ML ~~LOC~~ SOLN: 100.0000 [IU] | Freq: Every day | SUBCUTANEOUS | Status: DC

## 2011-02-10 MED ORDER — INSULIN ASPART 100 UNIT/ML ~~LOC~~ SOLN: 10.0000 [IU] | Freq: Three times a day (TID) | SUBCUTANEOUS | Status: DC

## 2011-02-10 NOTE — Telephone Encounter (Signed)
Samples ready, samples ready

## 2011-02-20 NOTE — H&P (Signed)
NAME:  [name], [name]                 ACCOUNT NO.:  [id number]   MEDICAL RECORD NO.:  [id number]          PATIENT TYPE:  IPS   LOCATION:  0300                          FACILITY:  [facility]   PHYSICIAN:  Anselm Jungling, MD  DATE OF BIRTH:  07-20-1961   DATE OF ADMISSION:  12/02/2006  DATE OF DISCHARGE:                       PSYCHIATRIC ADMISSION ASSESSMENT   A 50 year old divorced white female voluntarily admitted on December 02, 2006.   HISTORY OF PRESENT ILLNESS:  The patient presents with a history of  depression and suicidal thoughts, feeling very angry, losing her temper  with her 2  young children.  She has been having problems, crying, panic  attacks.  She states that she was told to be admitted for a while and  felt she was at the point where she needed to be admitted.  She has been  compliant with her medications.  Denies any use of alcohol or drug use.  The patient reports significant financial stressors, receiving only  child custody for income.   PAST PSYCHIATRIC HISTORY:  First admission to [facility].  Sees Dr. Jennelle Human for outpatient mental health services.  Has a therapist  appointment but not until March 10.  Has a history of bipolar disorder.   SOCIAL HISTORY:  She is a 50 year old divorced white female, divorced  for 4 years, has 3 children; a 103 year old daughter and 2 children ages  77 and 75.  Her older daughter is watching her 2 younger children.  She  is attempting to get disability.   FAMILY HISTORY:  Denies.   ALCOHOL AND DRUG HISTORY:  The patient smokes.  No alcohol or drug use.   PRIMARY CARE Tresea Heine:  Dr. Shelle Iron in [place].   MEDICAL PROBLEMS:  Chronic back pain, type 2 diabetes.   MEDICATIONS:  1. Prilosec 40 mg daily.  2. Depakote ER 1000 mg the morning, 1500 at bedtime.  3. Neurontin 600 mg t.i.d.  4. Lamictal 300 mg daily.  5. Avandia 4 mg b.i.d.  6. Metformin 1000 b.i.d.  7. Flexeril 10 mg t.i.d.  8. Klonopin 2 mg  b.i.d.   DRUG ALLERGIES:  No known allergies.   PHYSICAL EXAM/REVIEW OF SYSTEMS:  No fever, no chills.  Experiencing  chest pain with panic attacks.  The patient smokes.  No dyspnea, no  nausea, no vomiting, no dysuria.  Positive for heartburn.  No falls.  No  seizures.  Positive for depression with suicidal thoughts.  Temperature  is 97.3, 112 heart rate, respiratory rate is 20, blood pressure 158/71,  5 feet 6 inches tall.  She is 336 pounds.  She is a middle-aged female  who is obese, in no acute distress.  Negative lymphadenopathy.  Her  chest is clear.  Breast exam was deferred.  Heart regular rate and  rhythm.  Abdomen is a soft, nontender abdomen.  GU exam was deferred.  Extremities:  Moves all extremities.  No clubbing.  No deformities.  Skin is warm and dry.  Neurological findings are intact and nonfocal.  Hemoglobin 11.4, hematocrit 32.9, sodium 134, chloride is 94, glucose is  225, hemoglobin A1c is 10.2.  TSH is 2.718 and valproic acid level is  86.   MENTAL STATUS EXAM:  She is fully alert, cooperative, good eye contact.  She is casually dressed.  Her speech is clear, normal pace and tone.  The patient's mood is depressed.  The patient is sad, became tearful at  one point in time.  Thought processes are endorsing suicidal ideation  and does not appear to be having any psychotic symptoms.  Her cognitive  function intact.  Memory is good.  Judgment is fair.  Insight is fair.  Concentration intact.  AXIS I:  Bipolar disorder.  AXIS II:  Deferred.  AXIS III:  Chronic back pain, type 2 diabetes.  AXIS IV:  Problems with primary support group, economic issues,  psychosocial problems, medical problems.  AXIS V:  Current is 35.   PLAN:  Contract for safety.  Stabilize her mood and thinking.  We will  resume her medications.  We will continue to check Depakote level.  We  will check CBGs.  The patient is to increase coping skills.  We will  consider family session with her  daughter.  The patient is to follow up  Dr. Jennelle Human. The patient to follow up Dr. Jennelle Human and follow up with her  therapy appointments.  Case manager will see if we can an earlier  therapy appointment for her.  Tentative length of stay is 4 to 6 days.      Landry Corporal, N.P.      Anselm Jungling, MD  Electronically Signed    JO/MEDQ  D:  12/03/2006  T:  12/04/2006  Job:  [id number]

## 2011-02-20 NOTE — Discharge Summary (Signed)
NAME:  [name], [name]                 ACCOUNT NO.:  [id number]   MEDICAL RECORD NO.:  [id number]          PATIENT TYPE:  IPS   LOCATION:  0303                          FACILITY:  [facility]   PHYSICIAN:  Anselm Jungling, MD  DATE OF BIRTH:  05/29/1961   DATE OF ADMISSION:  12/02/2006  DATE OF DISCHARGE:  12/10/2006                               DISCHARGE SUMMARY   IDENTIFYING DATA/REASON FOR ADMISSION:  This was an inpatient  psychiatric admission for [name], a 50 year old divorced white female.  She  came to [place] with a history of depression and increasing suicidal  thoughts, as well as increasing irritability.  She also described  problems with frequent crying jags and panic attacks.  Please refer to  the admission note for further details pertaining to the symptoms,  circumstances and history that led to her hospitalization.   INITIAL DIAGNOSTIC IMPRESSION:  She was given initial AXIS I diagnoses  of bipolar disorder not otherwise specified.   MEDICAL/LABORATORY:  The patient came to [place] with a variety of medical  problems including obesity, and poorly controlled diabetes.  In  addition, she had gastroesophageal reflux disease, chronic pain.  She  was medically and physically assessed by the psychiatric nurse  practitioner.  She was counseled regarding diet and diabetic management,  and at the end of her stay was referred back to her physician for  further diabetic management.   HOSPITAL COURSE:  The patient was admitted to the adult inpatient  psychiatric service.  She presented as an obese woman who was alert,  oriented, and very pleasant.  Her mood was depressed with sad affect.  Although she admitted to suicidal ideation, she denied any intention or  plan during her entire inpatient stay.   The patient was involved in the therapeutic milieu and was a good  participant in the treatment program.  She was continued on a  psychotropic regimen involving Lamictal, Depakote ER, and Cymbalta  30 mg  daily which was begun as a new trial.   On the third hospital day, the undersigned became involved with the  patient's care.  The patient at that point was active in the milieu, and  pleasant and relaxed in one-to-one sessions.  She was sleeping poorly  though in spite of Ambien 10 mg.   The following day, she reported she was more depressed, missing her  children.  Also she was upset over an incident on the unit the night  before in which the patient had a medical emergency.   On the fourth hospital day, her Depakote level was measured at 86, which  was felt to be well within the therapeutic range.   On the fifth hospital day, there was a family session involving the  patient and her daughter.  In that meeting, she stated again that she  was not suicidal any longer.  They discussed their need to improve their  communication with each other.  They discussed follow-up and aftercare  considerations.   The patient continued with good participation in the treatment program.  There was a diabetic treatment program  recommendation consultation, that  made reference to consideration of an insulin regimen, and the need for  further outpatient diabetic educational consultation.   The patient was ultimately discharged on the ninth hospital day.  By  that time, her mood was significantly improved, she was more optimistic  and future-oriented.  She was noted to still have a habit of eating high  calorie junk food between meals, even following her consultation with  the nutritionist regarding diabetic diet and management.   The patient agreed to the following aftercare plan.   AFTERCARE:  The patient was to follow up with Dr. Jennelle Human with an  appointment on December 21, 2006.  She was also to follow up with her usual  medical doctor, Dr. Laymond Purser, on Monday, December 20, 2006 to further  review her diabetic management.  She was also to be seen by [facility] in [place].    DISCHARGE MEDICATIONS:  1. Protonix 40 mg b.i.d. or, as an alternative, Prilosec 20 mg daily.  2. Neurontin 600 mg t.i.d.  3. Lamictal 300 mg daily.  4. Avandia 4 mg b.i.d.  5. Flexeril 10 mg daily.  6. Amaryl 1 mg daily.  7. Glucophage 1000 mg twice daily.  8. Depakote ER 1000 mg q.a.m. and 1500 mg q.h.s.  9. Cymbalta 30 mg daily.   DISCHARGE DIAGNOSES:  AXIS I:  Bipolar disorder not otherwise specified.  AXIS II:  Deferred.  AXIS III:  History of obesity, hypertension, chronic pain, diabetes  mellitus.  AXIS IV:  Stressors:  Severe.  AXIS V:  GAF on discharge 65.      Anselm Jungling, MD  Electronically Signed     SPB/MEDQ  D:  12/22/2006  T:  12/22/2006  Job:  [id number]

## 2011-02-20 NOTE — Procedures (Signed)
EEG NUMBER:  __________ .   HISTORY:  This is a 51 year old with syncope who is having an EEG done to  evaluate for seizures.   PROCEDURE:  This is a routine EEG.   TECHNICAL DESCRIPTION:  Throughout this routine EEG, there is a posterior-  dominant rhythm of 10-Hz activity at 35-45 mV.  The background activity is  symmetric and mostly compromised of alpha-range activity at 25-40 mV.  There  is a tremendous amount of EKG artifact noticed in the background.  With  photic stimulation, there is a mild symmetric photic driving response noted.  Hyperventilation does not produce any significant abnormalities.  The  patient does become drowsy, however, does not enter stage II sleep.  Throughout this record, there is no evidence of electrographic seizures or  interictal discharge activity.   IMPRESSION:  This routine EEG is within normal limits in the awake state.           ______________________________  Bevelyn Buckles. Nash Shearer, M.D.     ZOX:[id number]  D:  07/27/2005 12:50:32  T:  07/28/2005 04:49:19  Job #:  [id number]

## 2011-03-10 ENCOUNTER — Encounter: Payer: Self-pay | Admitting: Internal Medicine

## 2011-03-11 ENCOUNTER — Ambulatory Visit (INDEPENDENT_AMBULATORY_CARE_PROVIDER_SITE_OTHER)
Admission: RE | Admit: 2011-03-11 | Discharge: 2011-03-11 | Disposition: A | Discharge status: Home / Self Care | Payer: Medicare Other | Source: Ambulatory Visit | Attending: Internal Medicine | Admitting: Internal Medicine

## 2011-03-11 ENCOUNTER — Encounter: Payer: Self-pay | Admitting: Internal Medicine

## 2011-03-11 ENCOUNTER — Other Ambulatory Visit (INDEPENDENT_AMBULATORY_CARE_PROVIDER_SITE_OTHER): Payer: Medicare Other

## 2011-03-11 ENCOUNTER — Ambulatory Visit (INDEPENDENT_AMBULATORY_CARE_PROVIDER_SITE_OTHER): Payer: Medicare Other | Admitting: Internal Medicine

## 2011-03-11 DIAGNOSIS — R05 Cough: Secondary | ICD-10-CM

## 2011-03-11 DIAGNOSIS — F329 Major depressive disorder, single episode, unspecified: Secondary | ICD-10-CM

## 2011-03-11 DIAGNOSIS — D509 Iron deficiency anemia, unspecified: Secondary | ICD-10-CM

## 2011-03-11 DIAGNOSIS — E785 Hyperlipidemia, unspecified: Secondary | ICD-10-CM

## 2011-03-11 DIAGNOSIS — R059 Cough, unspecified: Secondary | ICD-10-CM

## 2011-03-11 DIAGNOSIS — I10 Essential (primary) hypertension: Secondary | ICD-10-CM

## 2011-03-11 DIAGNOSIS — D518 Other vitamin B12 deficiency anemias: Secondary | ICD-10-CM

## 2011-03-11 DIAGNOSIS — E119 Type 2 diabetes mellitus without complications: Secondary | ICD-10-CM

## 2011-03-11 DIAGNOSIS — F3289 Other specified depressive episodes: Secondary | ICD-10-CM

## 2011-03-11 DIAGNOSIS — Z23 Encounter for immunization: Secondary | ICD-10-CM

## 2011-03-11 DIAGNOSIS — J209 Acute bronchitis, unspecified: Secondary | ICD-10-CM

## 2011-03-11 LAB — CBC WITH DIFFERENTIAL/PLATELET
Eosinophils Relative: 2.7 % (ref 0.0–5.0)
HCT: 38.5 % (ref 36.0–46.0)
Lymphs Abs: 3.4 10*3/uL (ref 0.7–4.0)
Monocytes Relative: 4.4 % (ref 3.0–12.0)
Neutrophils Relative %: 67 % (ref 43.0–77.0)
Platelets: 311 10*3/uL (ref 150.0–400.0)
RBC: 4.42 Mil/uL (ref 3.87–5.11)
WBC: 13.9 10*3/uL — ABNORMAL HIGH (ref 4.5–10.5)

## 2011-03-11 LAB — LIPID PANEL
Cholesterol: 126 mg/dL (ref 0–200)
HDL: 36.9 mg/dL — ABNORMAL LOW (ref >39.00)
Triglycerides: 187 mg/dL — ABNORMAL HIGH (ref 0.0–149.0)
VLDL: 37.4 mg/dL (ref 0.0–40.0)

## 2011-03-11 LAB — HEMOGLOBIN A1C: Hgb A1c MFr Bld: 11.3 % — ABNORMAL HIGH (ref 4.6–6.5)

## 2011-03-11 LAB — COMPREHENSIVE METABOLIC PANEL
ALT: 15 U/L (ref 0–35)
CO2: 29 mEq/L (ref 19–32)
Calcium: 9.7 mg/dL (ref 8.4–10.5)
Chloride: 97 mEq/L (ref 96–112)
GFR: 63.47 mL/min (ref >60.00)
Sodium: 134 mEq/L — ABNORMAL LOW (ref 135–145)
Total Protein: 7.1 g/dL (ref 6.0–8.3)

## 2011-03-11 LAB — URINALYSIS, ROUTINE W REFLEX MICROSCOPIC
Hgb urine dipstick: NEGATIVE
Ketones, ur: NEGATIVE
Urine Glucose: >=1000
Urobilinogen, UA: 0.2 (ref 0.0–1.0)

## 2011-03-11 MED ORDER — MOXIFLOXACIN HCL 400 MG PO TABS: 400.0000 mg | ORAL_TABLET | Freq: Every day | ORAL | Status: AC

## 2011-03-11 MED ORDER — PIOGLITAZONE HCL 15 MG PO TABS: 15.0000 mg | ORAL_TABLET | Freq: Every day | ORAL | Status: DC

## 2011-03-11 MED ORDER — INSULIN GLARGINE 100 UNIT/ML ~~LOC~~ SOLN: 200.0000 [IU] | Freq: Every day | SUBCUTANEOUS | Status: DC

## 2011-03-11 NOTE — Assessment & Plan Note (Signed)
Her BP is well controlled, today I will check her lytes and renal function 

## 2011-03-11 NOTE — Assessment & Plan Note (Signed)
Her blood sugars are very high so I have increased her lantus and added on actos to get better BS control, today I will check her blood sugar, A1C, and renal function

## 2011-03-11 NOTE — Assessment & Plan Note (Signed)
She is doing well on crestor 

## 2011-03-11 NOTE — Assessment & Plan Note (Signed)
Start avelox for infection

## 2011-03-11 NOTE — Assessment & Plan Note (Signed)
She is going well emotionally, I will check her Lithium level today and monitor for toxicity with a TSH and CMP

## 2011-03-11 NOTE — Patient Instructions (Signed)
Diabetes, Type 2 Diabetes is a lasting (chronic) disease. In type 2 diabetes, the pancreas does not make enough insulin (a hormone), and the body does not respond normally to the insulin that is made. This type of diabetes was also previously called adult onset diabetes. About 90% of all those who have diabetes have type 2. It usually occurs after the age of 61 but can occur at any age. CAUSES Unlike type 1 diabetes, which happens because insulin is no longer being made, type 2 diabetes happens because the body is making less insulin and has trouble using the insulin properly. SYMPTOMS  Drinking more than usual.   Urinating more than usual.   Blurred vision.   Dry, itchy skin.   Frequent infection like yeast infections in women.   More tired than usual (fatigue).  TREATMENT  Healthy eating.   Exercise.   Medication, if needed.   Monitoring blood glucose (sugar).   Seeing your caregiver regularly.  HOME CARE INSTRUCTIONS  Check your blood glucose (sugar) at least once daily. More frequent monitoring may be necessary, depending on your medications and on how well your diabetes is controlled. Your caregiver will advise you.   Take your medicine as directed by your caregiver.   Do not smoke.   Make wise food choices. Ask your caregiver for information. Weight loss can improve your diabetes.   Learn about low blood glucose (hypoglycemia) and how to treat it.   Get your eyes checked regularly.   Have a yearly physical exam. Have your blood pressure checked. Get your blood and urine tested.   Wear a pendant or bracelet saying that you have diabetes.   Check your feet every night for sores. Let your caregiver know if you have sores that are not healing.  SEEK MEDICAL CARE IF:  You are having problems keeping your blood glucose at target range.   You feel you might be having problems with your medicines.   You have symptoms of an illness that is not improving after 24  hours.   You have a sore or wound that is not healing.   You notice a change in vision or a new problem with your vision.   You develop a fever of more than 100.5.  Document Released: 09/21/2005 Document Re-Released: 10/13/2009 [facility] Patient Information 2011 [place], [place].Acute Bronchitis You have acute bronchitis. This means you have a chest cold. The airways in your lungs are inflamed (red and sore). Acute means it is sudden onset. Bronchitis is most often caused by a virus. In smokers, people with chronic lung problems, and elderly patients, treatment with antibiotics for bacterial infection may be needed. Exposure to cigarette smoke or irritating chemicals will make bronchitis worse. Allergies and asthma can also make bronchitis worse. Repeated episodes of bronchitis may cause long standing lung problems. Acute bronchitis is usually treated with rest, fluids, and medicines for relief of fever or cough. Bronchodilator medicines from metered inhalers or a nebulizer may be used to help open up the small airways. This reduces shortness of breath and helps control cough. Antibiotics can be prescribed if you are more seriously ill or at risk. A cool air vaporizer may help thin bronchial secretions and make it easier to clear your chest. Increased fluids may also help. You must avoid smoking, even second hand exposure. If you are a cigarette smoker, consider using nicotine gum or skin patches to help control withdrawal symptoms. Recovery from bronchitis is often slow, but you should start feeling better  after 2-3 days. Cough from bronchitis frequently lasts for 3-4 weeks.  SEEK IMMEDIATE MEDICAL CARE IF YOU DEVELOP:  Increased fever, chills, or chest pain.   Severe shortness of breath or bloody sputum.   Dehydration, fainting, repeated vomiting, severe headache.   No improvement after one week of proper treatment.  MAKE SURE YOU:   Understand these instructions.   Will watch your  condition.   Will get help right away if you are not doing well or get worse.  Document Released: 10/29/2004 Document Re-Released: 09/03/2008 [facility] Patient Information 2011 [place], [place].

## 2011-03-11 NOTE — Assessment & Plan Note (Signed)
Check a cxr today for pna, mass, edema, etc

## 2011-03-11 NOTE — Progress Notes (Signed)
Subjective:    Patient ID: [name], female    DOB: 1960-11-28, 50 y.o.   MRN: [id number]  URI  This is a new problem. The current episode started 1 to 4 weeks ago. The problem has been waxing and waning. There has been no fever. Associated symptoms include coughing (productive of yellow phlegm), sinus pain, sneezing and a sore throat. Pertinent negatives include no abdominal pain, chest pain, congestion, diarrhea, dysuria, ear pain, headaches, joint pain, joint swelling, nausea, neck pain, plugged ear sensation, rash, rhinorrhea, swollen glands, vomiting or wheezing. She has tried nothing for the symptoms.  Diabetes She presents for her follow-up diabetic visit. She has type 2 diabetes mellitus. Her disease course has been worsening. Hypoglycemia symptoms include dizziness. Pertinent negatives for hypoglycemia include no confusion, headaches, hunger, mood changes, nervousness/anxiousness, pallor, seizures, sleepiness, speech difficulty, sweats or tremors. Associated symptoms include polydipsia, polyphagia and polyuria. Pertinent negatives for diabetes include no blurred vision, no chest pain, no fatigue, no foot ulcerations, no visual change, no weakness and no weight loss. There are no hypoglycemic complications. Symptoms are worsening. There are no diabetic complications. Current diabetic treatment includes intensive insulin program and oral agent (dual therapy). She is compliant with treatment all of the time. Her weight is stable. She is following a generally unhealthy diet. When asked about meal planning, she reported none. She has not had a previous visit with a dietician. She never participates in exercise. Her home blood glucose trend is increasing steadily. Her breakfast blood glucose range is generally >200 mg/dl. Her lunch blood glucose range is generally >200 mg/dl. Her dinner blood glucose range is generally >200 mg/dl. Her highest blood glucose is >200 mg/dl. Her overall blood glucose range  is >200 mg/dl. An ACE inhibitor/angiotensin II receptor blocker is being taken. She does not see a podiatrist.Eye exam is current.      Review of Systems  Constitutional: Negative for fever, chills, weight loss, diaphoresis, activity change, appetite change, fatigue and unexpected weight change.  HENT: Positive for sore throat, sneezing and postnasal drip. Negative for hearing loss, ear pain, nosebleeds, congestion, facial swelling, rhinorrhea, mouth sores, trouble swallowing, neck pain, neck stiffness, voice change, sinus pressure, tinnitus and ear discharge.   Eyes: Negative for blurred vision, photophobia and visual disturbance.  Respiratory: Positive for cough (productive of yellow phlegm). Negative for apnea, choking, chest tightness, shortness of breath, wheezing and stridor.   Cardiovascular: Negative for chest pain, palpitations and leg swelling.  Gastrointestinal: Negative for nausea, vomiting, abdominal pain, diarrhea, constipation, blood in stool and abdominal distention.  Genitourinary: Positive for polyuria and frequency. Negative for dysuria, urgency, hematuria, flank pain, decreased urine volume, difficulty urinating and dyspareunia.  Musculoskeletal: Negative for myalgias, back pain, joint pain, joint swelling, arthralgias and gait problem.  Skin: Negative for color change, pallor and rash.  Neurological: Positive for dizziness. Negative for tremors, seizures, syncope, facial asymmetry, speech difficulty, weakness, light-headedness, numbness and headaches.  Hematological: Positive for polydipsia and polyphagia. Negative for adenopathy. Does not bruise/bleed easily.  Psychiatric/Behavioral: Negative for suicidal ideas, hallucinations, behavioral problems, confusion, sleep disturbance, self-injury, dysphoric mood, decreased concentration and agitation. The patient is not nervous/anxious and is not hyperactive.        Objective:   Physical Exam  [vitalsreviewed. Constitutional:  She is oriented to person, place, and time. She appears well-developed and well-nourished. No distress.  HENT:  Head: Normocephalic and atraumatic.  Right Ear: External ear normal.  Left Ear: External ear normal.  Nose: Nose normal.  Mouth/Throat:  Oropharynx is clear and moist. No oropharyngeal exudate.  Eyes: Conjunctivae and EOM are normal. Pupils are equal, round, and reactive to light. Right eye exhibits no discharge. Left eye exhibits no discharge. No scleral icterus.  Neck: Normal range of motion. Neck supple. No JVD present. No tracheal deviation present. No thyromegaly present.  Cardiovascular: Normal rate, regular rhythm, normal heart sounds and intact distal pulses.  Exam reveals no gallop and no friction rub.   No murmur heard. Pulmonary/Chest: Effort normal and breath sounds normal. No stridor. No respiratory distress. She has no wheezes. She has no rales. She exhibits no tenderness.  Abdominal: Soft. Bowel sounds are normal. She exhibits no distension and no mass. There is no tenderness. There is no rebound and no guarding.  Musculoskeletal: Normal range of motion. She exhibits no edema and no tenderness.  Lymphadenopathy:    She has no cervical adenopathy.  Neurological: She is alert and oriented to person, place, and time. She has normal reflexes. She displays normal reflexes. No cranial nerve deficit. She exhibits normal muscle tone. Coordination normal.  Skin: Skin is warm and dry. No rash noted. She is not diaphoretic. No erythema. No pallor.  Psychiatric: She has a normal mood and affect. Her behavior is normal. Judgment and thought content normal.        Lab Results  Component Value Date   WBC 12.2* 12/08/2010   HGB 11.4* 12/08/2010   HCT 33.9* 12/08/2010   PLT 411.0* 12/08/2010   CHOL 163 12/08/2010   TRIG 390.0* 12/08/2010   HDL 35.30* 12/08/2010   LDLDIRECT 90.5 12/08/2010   ALT 13 12/08/2010   AST 15 12/08/2010   NA 133* 12/08/2010   K 4.4 12/08/2010   CL 97 12/08/2010    CREATININE 0.9 12/08/2010   BUN 7 12/08/2010   CO2 29 12/08/2010   TSH 1.10 12/08/2010   INR 0.97 07/16/2010   HGBA1C 10.8* 12/08/2010   MICROALBUR 13.7* 09/11/2010    Assessment & Plan:

## 2011-03-12 ENCOUNTER — Encounter: Payer: Self-pay | Admitting: Internal Medicine

## 2011-03-12 ENCOUNTER — Telehealth: Payer: Self-pay | Admitting: *Deleted

## 2011-03-12 NOTE — Assessment & Plan Note (Signed)
Her blood sugars have been very high so I will recheck her A1C and CMP today

## 2011-03-12 NOTE — Telephone Encounter (Signed)
Pt req result of cxr. Advised her it was normal.

## 2011-03-13 ENCOUNTER — Other Ambulatory Visit: Payer: Self-pay | Admitting: *Deleted

## 2011-03-13 DIAGNOSIS — E119 Type 2 diabetes mellitus without complications: Secondary | ICD-10-CM

## 2011-03-13 MED ORDER — INSULIN GLARGINE 100 UNIT/ML ~~LOC~~ SOLN: 200.0000 [IU] | Freq: Every day | SUBCUTANEOUS | Status: DC

## 2011-03-13 NOTE — Progress Notes (Signed)
Needs rx of lantus w/updated directions

## 2011-03-23 ENCOUNTER — Telehealth: Payer: Self-pay | Admitting: *Deleted

## 2011-03-23 NOTE — Telephone Encounter (Signed)
Needs follow-up

## 2011-03-23 NOTE — Telephone Encounter (Signed)
Pt states that she was given ABX for bronchitis last week [last OV 03/11/11] and that she is not any better. Pt would like to know if she could get another ABX Rx or if she needs OV?

## 2011-03-23 NOTE — Telephone Encounter (Signed)
Pt cannot afford to come in for OV until the first of the month [money issue]. Advised Pt to be seen at ED for evaluation if experiencing any chest pain or SOB; Pt agreed. Pt requesting samples of: Lantus, Januvia, & Crestor--Samples are ready for P/U tomorrow at nurses station [Lantus refrig] Pt will make ROV appt for after 1st of July.

## 2011-04-14 ENCOUNTER — Encounter: Payer: Self-pay | Admitting: Internal Medicine

## 2011-04-14 ENCOUNTER — Telehealth: Payer: Self-pay | Admitting: *Deleted

## 2011-04-14 DIAGNOSIS — Z0000 Encounter for general adult medical examination without abnormal findings: Secondary | ICD-10-CM | POA: Insufficient documentation

## 2011-04-14 NOTE — Telephone Encounter (Signed)
Spoke w/pt - she c/o swelling in left foot - no injuries, no discoloration or pain. Scheduled for eval Thursday, will call office or go to ER if symptoms become severe

## 2011-04-14 NOTE — Telephone Encounter (Signed)
Patient requesting a call regarding swelling in her left calf & foot.

## 2011-04-16 ENCOUNTER — Ambulatory Visit: Payer: Medicare Other | Admitting: Internal Medicine

## 2011-06-30 ENCOUNTER — Emergency Department (HOSPITAL_COMMUNITY)
Admission: EM | Admit: 2011-06-30 | Discharge: 2011-06-30 | Disposition: A | Discharge status: Home / Self Care | Payer: Medicare Other | Attending: Emergency Medicine | Admitting: Emergency Medicine

## 2011-06-30 DIAGNOSIS — E119 Type 2 diabetes mellitus without complications: Secondary | ICD-10-CM | POA: Insufficient documentation

## 2011-06-30 DIAGNOSIS — F17200 Nicotine dependence, unspecified, uncomplicated: Secondary | ICD-10-CM | POA: Insufficient documentation

## 2011-06-30 DIAGNOSIS — F319 Bipolar disorder, unspecified: Secondary | ICD-10-CM | POA: Insufficient documentation

## 2011-06-30 DIAGNOSIS — Z79899 Other long term (current) drug therapy: Secondary | ICD-10-CM | POA: Insufficient documentation

## 2011-06-30 DIAGNOSIS — I10 Essential (primary) hypertension: Secondary | ICD-10-CM | POA: Insufficient documentation

## 2011-06-30 DIAGNOSIS — G4700 Insomnia, unspecified: Secondary | ICD-10-CM | POA: Insufficient documentation

## 2011-06-30 LAB — CBC
MCH: 30 pg (ref 26.0–34.0)
MCHC: 35.6 g/dL (ref 30.0–36.0)
MCV: 84.3 fL (ref 78.0–100.0)
Platelets: 318 10*3/uL (ref 150–400)
RDW: 13.5 % (ref 11.5–15.5)

## 2011-06-30 LAB — BASIC METABOLIC PANEL WITH GFR
BUN: 4 mg/dL — ABNORMAL LOW (ref 6–23)
CO2: 21 meq/L (ref 19–32)
Calcium: 9.7 mg/dL (ref 8.4–10.5)
Chloride: 93 meq/L — ABNORMAL LOW (ref 96–112)
Creatinine, Ser: 0.8 mg/dL (ref 0.50–1.10)
GFR calc Af Amer: >60 mL/min
GFR calc non Af Amer: >60 mL/min
Glucose, Bld: 446 mg/dL — ABNORMAL HIGH (ref 70–99)
Potassium: 3.5 meq/L (ref 3.5–5.1)
Sodium: 129 meq/L — ABNORMAL LOW (ref 135–145)

## 2011-06-30 LAB — DIFFERENTIAL
Basophils Relative: 0 % (ref 0–1)
Eosinophils Absolute: 0.2 10*3/uL (ref 0.0–0.7)
Eosinophils Relative: 1 % (ref 0–5)
Lymphs Abs: 5.1 10*3/uL — ABNORMAL HIGH (ref 0.7–4.0)
Monocytes Relative: 6 % (ref 3–12)
Neutrophils Relative %: 60 % (ref 43–77)

## 2011-06-30 LAB — URINALYSIS, ROUTINE W REFLEX MICROSCOPIC
Bilirubin Urine: NEGATIVE
Ketones, ur: NEGATIVE mg/dL
Leukocytes, UA: NEGATIVE
Nitrite: NEGATIVE
Protein, ur: 30 mg/dL — AB
Urobilinogen, UA: 0.2 mg/dL (ref 0.0–1.0)
pH: 6.5 (ref 5.0–8.0)

## 2011-06-30 LAB — GLUCOSE, CAPILLARY
Glucose-Capillary: 338 mg/dL — ABNORMAL HIGH (ref 70–99)
Glucose-Capillary: 274 mg/dL — ABNORMAL HIGH (ref 70–99)
Glucose-Capillary: 235 mg/dL — ABNORMAL HIGH (ref 70–99)
Glucose-Capillary: 202 mg/dL — ABNORMAL HIGH (ref 70–99)

## 2011-06-30 LAB — HEPATIC FUNCTION PANEL
ALT: 7 U/L (ref 0–35)
AST: 10 U/L (ref 0–37)
Albumin: 3.3 g/dL — ABNORMAL LOW (ref 3.5–5.2)
Alkaline Phosphatase: 95 U/L (ref 39–117)
Bilirubin, Direct: <0.1 mg/dL (ref 0.0–0.3)
Total Bilirubin: 0.3 mg/dL (ref 0.3–1.2)
Total Protein: 6.7 g/dL (ref 6.0–8.3)

## 2011-06-30 LAB — PREGNANCY, URINE: Preg Test, Ur: NEGATIVE

## 2011-07-09 ENCOUNTER — Emergency Department (HOSPITAL_COMMUNITY): Payer: Medicare Other

## 2011-07-09 ENCOUNTER — Inpatient Hospital Stay (HOSPITAL_COMMUNITY)
Admission: EM | Admit: 2011-07-09 | Discharge: 2011-07-12 | DRG: 917 | Disposition: A | Discharge status: Other Acute Inpatient Hospital | Payer: Medicare Other | Attending: Internal Medicine | Admitting: Internal Medicine

## 2011-07-09 DIAGNOSIS — T398X2A Poisoning by other nonopioid analgesics and antipyretics, not elsewhere classified, intentional self-harm, initial encounter: Secondary | ICD-10-CM | POA: Diagnosis present

## 2011-07-09 DIAGNOSIS — T426X2A Poisoning by other antiepileptic and sedative-hypnotic drugs, intentional self-harm, initial encounter: Secondary | ICD-10-CM | POA: Diagnosis present

## 2011-07-09 DIAGNOSIS — T4271XA Poisoning by unspecified antiepileptic and sedative-hypnotic drugs, accidental (unintentional), initial encounter: Principal | ICD-10-CM | POA: Diagnosis present

## 2011-07-09 DIAGNOSIS — T50992A Poisoning by other drugs, medicaments and biological substances, intentional self-harm, initial encounter: Secondary | ICD-10-CM | POA: Diagnosis present

## 2011-07-09 DIAGNOSIS — R4182 Altered mental status, unspecified: Secondary | ICD-10-CM

## 2011-07-09 DIAGNOSIS — T4272XA Poisoning by unspecified antiepileptic and sedative-hypnotic drugs, intentional self-harm, initial encounter: Secondary | ICD-10-CM | POA: Diagnosis present

## 2011-07-09 DIAGNOSIS — T43502A Poisoning by unspecified antipsychotics and neuroleptics, intentional self-harm, initial encounter: Secondary | ICD-10-CM | POA: Diagnosis present

## 2011-07-09 DIAGNOSIS — T43294A Poisoning by other antidepressants, undetermined, initial encounter: Secondary | ICD-10-CM

## 2011-07-09 DIAGNOSIS — J9600 Acute respiratory failure, unspecified whether with hypoxia or hypercapnia: Secondary | ICD-10-CM | POA: Diagnosis present

## 2011-07-09 DIAGNOSIS — Y92009 Unspecified place in unspecified non-institutional (private) residence as the place of occurrence of the external cause: Secondary | ICD-10-CM

## 2011-07-09 DIAGNOSIS — T426X1A Poisoning by other antiepileptic and sedative-hypnotic drugs, accidental (unintentional), initial encounter: Secondary | ICD-10-CM | POA: Diagnosis present

## 2011-07-09 DIAGNOSIS — T394X2A Poisoning by antirheumatics, not elsewhere classified, intentional self-harm, initial encounter: Secondary | ICD-10-CM | POA: Diagnosis present

## 2011-07-09 DIAGNOSIS — G9340 Encephalopathy, unspecified: Secondary | ICD-10-CM | POA: Diagnosis present

## 2011-07-09 DIAGNOSIS — T39314A Poisoning by propionic acid derivatives, undetermined, initial encounter: Secondary | ICD-10-CM | POA: Diagnosis present

## 2011-07-09 DIAGNOSIS — T43591A Poisoning by other antipsychotics and neuroleptics, accidental (unintentional), initial encounter: Secondary | ICD-10-CM

## 2011-07-09 DIAGNOSIS — F329 Major depressive disorder, single episode, unspecified: Secondary | ICD-10-CM | POA: Diagnosis present

## 2011-07-09 DIAGNOSIS — E119 Type 2 diabetes mellitus without complications: Secondary | ICD-10-CM

## 2011-07-09 LAB — COMPREHENSIVE METABOLIC PANEL
ALT: 13 U/L (ref 0–35)
AST: 18 U/L (ref 0–37)
AST: 12 U/L (ref 0–37)
BUN: 4 mg/dL — ABNORMAL LOW (ref 6–23)
CO2: 26 mEq/L (ref 19–32)
CO2: 27 mEq/L (ref 19–32)
Calcium: 9.3 mg/dL (ref 8.4–10.5)
Calcium: 8.6 mg/dL (ref 8.4–10.5)
Creatinine, Ser: 0.61 mg/dL (ref 0.50–1.10)
GFR calc non Af Amer: 86 mL/min — ABNORMAL LOW (ref >90)
GFR calc non Af Amer: >90 mL/min (ref >90)
Sodium: 134 mEq/L — ABNORMAL LOW (ref 135–145)
Total Protein: 6.6 g/dL (ref 6.0–8.3)

## 2011-07-09 LAB — DIFFERENTIAL
Eosinophils Absolute: 0.3 10*3/uL (ref 0.0–0.7)
Eosinophils Relative: 3 % (ref 0–5)
Lymphs Abs: 4.4 10*3/uL — ABNORMAL HIGH (ref 0.7–4.0)
Monocytes Absolute: 0.6 10*3/uL (ref 0.1–1.0)
Monocytes Relative: 6 % (ref 3–12)

## 2011-07-09 LAB — BLOOD GAS, ARTERIAL
Acid-base deficit: 3.1 mmol/L — ABNORMAL HIGH (ref 0.0–2.0)
Bicarbonate: 25.2 mEq/L — ABNORMAL HIGH (ref 20.0–24.0)
Drawn by: 317871
FIO2: 1 %
MECHVT: 400 mL
PEEP: 5 cmH2O
Patient temperature: 37
RATE: 20 resp/min
TCO2: 22 mmol/L (ref 0–100)
TCO2: 22.4 mmol/L (ref 0–100)
pCO2 arterial: 52.1 mmHg — ABNORMAL HIGH (ref 35.0–45.0)
pCO2 arterial: 42.1 mmHg (ref 35.0–45.0)
pH, Arterial: 7.395 (ref 7.350–7.400)
pO2, Arterial: 137 mmHg — ABNORMAL HIGH (ref 80.0–100.0)

## 2011-07-09 LAB — POCT I-STAT, CHEM 8
Chloride: 101 mEq/L (ref 96–112)
HCT: 42 % (ref 36.0–46.0)
Potassium: 3.7 mEq/L (ref 3.5–5.1)

## 2011-07-09 LAB — GLUCOSE, CAPILLARY
Glucose-Capillary: 280 mg/dL — ABNORMAL HIGH (ref 70–99)
Glucose-Capillary: 311 mg/dL — ABNORMAL HIGH (ref 70–99)
Glucose-Capillary: 180 mg/dL — ABNORMAL HIGH (ref 70–99)
Glucose-Capillary: 183 mg/dL — ABNORMAL HIGH (ref 70–99)

## 2011-07-09 LAB — MRSA PCR SCREENING: MRSA by PCR: NEGATIVE

## 2011-07-09 LAB — CBC
MCH: 28.6 pg (ref 26.0–34.0)
MCHC: 33.4 g/dL (ref 30.0–36.0)
MCV: 85.6 fL (ref 78.0–100.0)
Platelets: 305 10*3/uL (ref 150–400)
RBC: 4.72 MIL/uL (ref 3.87–5.11)
RDW: 13.6 % (ref 11.5–15.5)

## 2011-07-09 LAB — RAPID URINE DRUG SCREEN, HOSP PERFORMED: Amphetamines: NOT DETECTED

## 2011-07-09 LAB — LITHIUM LEVEL: Lithium Lvl: 0.67 mEq/L — ABNORMAL LOW (ref 0.80–1.40)

## 2011-07-09 LAB — PROCALCITONIN: Procalcitonin: <0.1 ng/mL

## 2011-07-09 LAB — ACETAMINOPHEN LEVEL: Acetaminophen (Tylenol), Serum: <15 ug/mL (ref 10–30)

## 2011-07-10 ENCOUNTER — Inpatient Hospital Stay (HOSPITAL_COMMUNITY): Payer: Medicare Other

## 2011-07-10 LAB — GLUCOSE, CAPILLARY
Glucose-Capillary: 162 mg/dL — ABNORMAL HIGH (ref 70–99)
Glucose-Capillary: 182 mg/dL — ABNORMAL HIGH (ref 70–99)
Glucose-Capillary: 251 mg/dL — ABNORMAL HIGH (ref 70–99)
Glucose-Capillary: 289 mg/dL — ABNORMAL HIGH (ref 70–99)
Glucose-Capillary: 236 mg/dL — ABNORMAL HIGH (ref 70–99)

## 2011-07-10 LAB — BLOOD GAS, ARTERIAL
Bicarbonate: 26.8 mEq/L — ABNORMAL HIGH (ref 20.0–24.0)
TCO2: 24.2 mmol/L (ref 0–100)
pCO2 arterial: 47.2 mmHg — ABNORMAL HIGH (ref 35.0–45.0)
pH, Arterial: 7.373 (ref 7.350–7.400)
pO2, Arterial: 84.4 mmHg (ref 80.0–100.0)

## 2011-07-10 LAB — COMPREHENSIVE METABOLIC PANEL
ALT: 9 U/L (ref 0–35)
AST: 11 U/L (ref 0–37)
Albumin: 2.7 g/dL — ABNORMAL LOW (ref 3.5–5.2)
CO2: 29 mEq/L (ref 19–32)
Chloride: 103 mEq/L (ref 96–112)
GFR calc non Af Amer: >90 mL/min (ref >90)
Sodium: 137 mEq/L (ref 135–145)
Total Bilirubin: 0.3 mg/dL (ref 0.3–1.2)

## 2011-07-10 LAB — CBC
HCT: 37.4 % (ref 36.0–46.0)
MCH: 28.6 pg (ref 26.0–34.0)
MCHC: 32.6 g/dL (ref 30.0–36.0)
MCV: 87.8 fL (ref 78.0–100.0)
RDW: 14.3 % (ref 11.5–15.5)

## 2011-07-11 DIAGNOSIS — F329 Major depressive disorder, single episode, unspecified: Secondary | ICD-10-CM

## 2011-07-11 LAB — GLUCOSE, CAPILLARY
Glucose-Capillary: 216 mg/dL — ABNORMAL HIGH (ref 70–99)
Glucose-Capillary: 252 mg/dL — ABNORMAL HIGH (ref 70–99)

## 2011-07-12 ENCOUNTER — Inpatient Hospital Stay (HOSPITAL_COMMUNITY)
Admission: AD | Admit: 2011-07-12 | Discharge: 2011-07-16 | DRG: 885 | Disposition: A | Discharge status: Home / Self Care | Payer: Medicare Other | Attending: Psychiatry | Admitting: Psychiatry

## 2011-07-12 DIAGNOSIS — G8929 Other chronic pain: Secondary | ICD-10-CM

## 2011-07-12 DIAGNOSIS — K219 Gastro-esophageal reflux disease without esophagitis: Secondary | ICD-10-CM

## 2011-07-12 DIAGNOSIS — T4272XA Poisoning by unspecified antiepileptic and sedative-hypnotic drugs, intentional self-harm, initial encounter: Secondary | ICD-10-CM

## 2011-07-12 DIAGNOSIS — T43502A Poisoning by unspecified antipsychotics and neuroleptics, intentional self-harm, initial encounter: Secondary | ICD-10-CM

## 2011-07-12 DIAGNOSIS — Z794 Long term (current) use of insulin: Secondary | ICD-10-CM

## 2011-07-12 DIAGNOSIS — T426X1A Poisoning by other antiepileptic and sedative-hypnotic drugs, accidental (unintentional), initial encounter: Secondary | ICD-10-CM

## 2011-07-12 DIAGNOSIS — G589 Mononeuropathy, unspecified: Secondary | ICD-10-CM

## 2011-07-12 DIAGNOSIS — T426X2A Poisoning by other antiepileptic and sedative-hypnotic drugs, intentional self-harm, initial encounter: Secondary | ICD-10-CM

## 2011-07-12 DIAGNOSIS — T43294A Poisoning by other antidepressants, undetermined, initial encounter: Secondary | ICD-10-CM

## 2011-07-12 DIAGNOSIS — Z79899 Other long term (current) drug therapy: Secondary | ICD-10-CM

## 2011-07-12 DIAGNOSIS — F339 Major depressive disorder, recurrent, unspecified: Principal | ICD-10-CM

## 2011-07-12 DIAGNOSIS — T4271XA Poisoning by unspecified antiepileptic and sedative-hypnotic drugs, accidental (unintentional), initial encounter: Secondary | ICD-10-CM

## 2011-07-12 DIAGNOSIS — F411 Generalized anxiety disorder: Secondary | ICD-10-CM

## 2011-07-12 DIAGNOSIS — Z111 Encounter for screening for respiratory tuberculosis: Secondary | ICD-10-CM

## 2011-07-12 DIAGNOSIS — T50992A Poisoning by other drugs, medicaments and biological substances, intentional self-harm, initial encounter: Secondary | ICD-10-CM

## 2011-07-12 DIAGNOSIS — E119 Type 2 diabetes mellitus without complications: Secondary | ICD-10-CM

## 2011-07-12 DIAGNOSIS — M549 Dorsalgia, unspecified: Secondary | ICD-10-CM

## 2011-07-12 LAB — GLUCOSE, CAPILLARY
Glucose-Capillary: 258 mg/dL — ABNORMAL HIGH (ref 70–99)
Glucose-Capillary: 273 mg/dL — ABNORMAL HIGH (ref 70–99)

## 2011-07-12 NOTE — Consult Note (Signed)
NAME:  [name], [name]                 ACCOUNT NO.:  [id number]  MEDICAL RECORD NO.:  [id number]  LOCATION:  1510                         FACILITY:  [facility]  PHYSICIAN:  Aundria Bitterman T. Joenathan Sakuma, M.D.   DATE OF BIRTH:  1960/10/18  DATE OF CONSULTATION:  07/11/2011 DATE OF DISCHARGE:                                CONSULTATION   The patient is a 50 year old Caucasian female who is admitted on a medical floor after she overdosed on a unknown amount of Neurontin, clonazepam, trazodone, and Ambien.  The patient was intubated. Psychiatric consult was called due to the above reason.  Patient reported that she has been feeling more depressed for past 1 year, but recently more last week when her brother died who was 68 year old due to massive heart attack.  Patient told that she does not have a lot of family members.  Her children has already left her and they were more close to their dad, and she has no reason to live.  Patient has been seeing [facility] and getting lithium and Cymbalta, but does not feel the current medicine is working very well.  She reported that she feels worthless, hopeless, and no desire to live.  She also reported poor sleep, racing thoughts, and lately severe mood swings and anger. She was involuntary committed at of the hospital.  PAST PSYCHIATRIC HISTORY:  Patient has at least one psychiatric hospitalization at [facility] in 2008.  Due to severe depression, she was given a diagnosis of bipolar disorder.  She was discharged on Depakote, Lamictal, Neurontin, and Klonopin; however, the patient did not follow up with her psychiatrist, Dr. Jennelle Human, and decided to move to [facility], but very recently she is back again in [place] as she could not find a food hole in [place].  Patient admitted history of suicidal thinking in the past.  SOCIAL HISTORY:  Patient is a divorced female.  She has three children, but as per patient, none of them talk to her.  She  has no family support.  Patient denies any alcohol or drug history.  MEDICAL HISTORY:  Patient has, 1. Diabetes mellitus. 2. Chronic back pain.  Her doctor is Dr. Latanya Maudlin at [facility].  MEDICATIONS:  She was taking Prozac, Lantus insulin, clonidine, Protonix, and ibuprofen on the medical floor.  MENTAL STATUS EXAM:  The patient is very depressed, anxious, maintained poor eye contact.  Her speech is slow, but soft.  Thought process was goal-directed.  She endorsed positive suicidal thinking and plan to overdose again, when she gets the chance.  There were no psychotic symptoms at this time.  She denies any auditory hallucinations or visual hallucinations.  Her attention and concentration were okay.  She described her mood is depressed.  Her affect was constricted.  She is alert and oriented x3.  Her insight, judgment, and impulse control is limited.  DIAGNOSIS:  Axis I 1. Major depressive disorder, rule out bipolar disorder and depressed     mood.  Axis II Deferred.  Axis III See medical history.  Axis IV Moderate to severe.  Axis V: 15 to 25.  PLAN:  At this time, patient does need require inpatient  treatment for stabilization.  She is involuntary committed on the medical floor, once medically cleared, should be transferred from the medical to the Psychiatry floor for continued treatment.  The patient will require a sitter for suicidal behavior while she is on the medical floor.  Thank you for involving me in the patient's care.     Jaydah Stahle T. Lolly Mustache, M.D.     STA/MEDQ  D:  07/11/2011  T:  07/11/2011  Job:  [id number]  Electronically Signed by Kathryne Sharper M.D. on 07/12/2011 11:04:45 AM

## 2011-07-13 DIAGNOSIS — F339 Major depressive disorder, recurrent, unspecified: Secondary | ICD-10-CM

## 2011-07-13 DIAGNOSIS — F411 Generalized anxiety disorder: Secondary | ICD-10-CM

## 2011-07-13 LAB — GLUCOSE, CAPILLARY
Glucose-Capillary: 282 mg/dL — ABNORMAL HIGH (ref 70–99)
Glucose-Capillary: 391 mg/dL — ABNORMAL HIGH (ref 70–99)

## 2011-07-14 LAB — GLUCOSE, CAPILLARY
Glucose-Capillary: 242 mg/dL — ABNORMAL HIGH (ref 70–99)
Glucose-Capillary: 372 mg/dL — ABNORMAL HIGH (ref 70–99)
Glucose-Capillary: 412 mg/dL — ABNORMAL HIGH (ref 70–99)

## 2011-07-15 LAB — STREP A DNA PROBE

## 2011-07-15 LAB — GLUCOSE, CAPILLARY
Glucose-Capillary: 268 mg/dL — ABNORMAL HIGH (ref 70–99)
Glucose-Capillary: 303 mg/dL — ABNORMAL HIGH (ref 70–99)

## 2011-07-15 NOTE — Discharge Summary (Signed)
NAMEFarah, Lepak Rihana                 ACCOUNT NO.:  [id number]  MEDICAL RECORD NO.:  [id number]  LOCATION:  1237                         FACILITY:  [facility]  PHYSICIAN:  Charlaine Dalton. Sherene Sires, MD, FCCPDATE OF BIRTH:  January 27, 1961  DATE OF ADMISSION:  07/09/2011 DATE OF DISCHARGE:                              DISCHARGE SUMMARY   Please note, this is an interim discharge summary for change of service.  DAY OF CRITICAL CARE SIGN-OFF:  July 10, 2011.  FINAL DIAGNOSES: 1. Polysubstance overdose in the setting of severe depression. 2. Acute respiratory failure secondary to altered sensorium and     encephalopathy. 3. Diabetes type 2.  LABORATORY DATA:  Hemoglobin 12.2, hematocrit 37.4, and platelet count 260.  INR 1.02, PTT 33, and PT was 13.6.  Sodium 137, potassium 3.5, chloride 103, bicarbonate 29, creatinine 0.59, AST 11, ALT 9, bilirubin 0.3, alk phos 86.  Acetaminophen level less than 15.  Arterial blood gas on room air 7.37, pCO2 of 47, pO2 of 84, bicarbonate 27, and saturation 97%.  Lithium level was 0.67.  Salicylate less than 2.  Alcohol less than 11.  Urine drug screen was negative.  BRIEF HISTORY:  This is a 50 year old female patient with a significant psychiatric history, brought to the emergency room shortly after midnight on July 09, 2011, after taking reported overdose of Klonopin, Ambien, trazodone, and Neurontin.  All of unknown quantity.  She is intubated for airway protection and the pulmonary critical care team was asked to admit.  HOSPITAL COURSE BY DISCHARGE DIAGNOSES: 1. Polysubstance overdose in the setting of severe depression.  Ms.     Placzek was admitted to the intensive care following overdose with     resultant respiratory failure.  [employer] was     notified.  All home medications were discontinued.  She was briefly     supported on Diprivan, and successfully extubated.  She had no QT     corrected for heart rate changes, her liver  function tests have     been normal, her blood chemistries have been normal.  She is being     slowly now transferred into the regular medical ward with     psychiatric sitter at the bedside.  She is now pending psychiatric     evaluation for a possible inpatient admission. 2. Encephalopathy secondary to polysubstance overdose.  This is now     resolved. 3. Depression.  At this point, we got psychiatric services to     evaluate. 4. Diabetes.  For this, she will continue sliding scale insulin. 5. Acute respiratory failure.  This is currently resolved and the     patient successfully extubated.  DISCHARGE DIET:  Carb modified diet.  DISCHARGE ACTIVITY:  Cleared for physical activity.  CURRENT MEDICATIONS ON HOLD: 1. Neurontin 300 mg p.o. 2 caps b.i.d. 2. Cymbalta 1 tab daily. 3. Trazodone 100 mg one to two q.h.s. 4. Lithium carbonate 300 mg, 2 tablets b.i.d. 5. Clonazepam 1 mg tab t.i.d. 6. Alprazolam strength unknown, sliding scale unknown. 7. Ibuprofen 200 mg 1-2 tabs every 8 hours p.r.n. 8. Omeprazole 20 mg p.r.n. daily.  DISPOSITION:  Ms. Thornley's maximum  benefit from critical care services. She is now being transferred to the medical ward.  [facility] will assume her care.  She is pending psychiatric evaluation, and from that point, will be either discharged to home, or inpatient psychiatric services.     Zenia Resides, NP   ______________________________ Charlaine Dalton. Sherene Sires, MD, FCCP    PB/MEDQ  D:  07/10/2011  T:  07/10/2011  Job:  [id number]  Electronically Signed by Zenia Resides NP on 07/10/2011 03:25:29 PM Electronically Signed by Sandrea Hughs MD FCCP on 07/15/2011 04:20:35 PM

## 2011-07-16 LAB — GLUCOSE, CAPILLARY

## 2011-07-16 NOTE — Discharge Summary (Signed)
  NAMEDuyen, [name]                 ACCOUNT NO.:  [id number]  MEDICAL RECORD NO.:  [id number]  LOCATION:  1510                         FACILITY:  [facility]  PHYSICIAN:  Baltazar Najjar, MD     DATE OF BIRTH:  1960/12/19  DATE OF ADMISSION:  07/09/2011 DATE OF DISCHARGE:  07/10/2011                              DISCHARGE SUMMARY   ADDENDUM:  Please refer to the dictated discharge summary dated July 10, 2011 for more details.  The patient was seen and examined by me today.  She is complaining of feeling horrible of being here, very depressed, still has suicidal ideations.  Stated that she will harm herself at some point; it is just a matter of time.  Psych evaluation is still pending at the time of dictation, however, the patient will most likely need inpatient psych hospitalization.  We will defer that to psych consult.  The patient is medically stable for transfer to inpatient psych facility if that is felt to be indicated by psychiatric consult.  Regarding her diabetes mellitus type 2, I will start her on Lantus insulin 10 units q.h.s., continue with insulin sliding scale and monitor CBGs.  Rest of problem list and hospital course is as per previous discharge summary.  DISCHARGE MEDICATIONS: 1. Lantus insulin 10 units q.h.s. 2. Clonidine 1 mg p.o. q.8. 3. Insulin sliding scale 1-20 units as per scale. 4. Protonix 40 mg p.o. daily. 5. Ibuprofen 600 mg p.o. q.6 hours p.r.n.  CONDITION ON DISCHARGE:  Stable from medical standpoint.          ______________________________ Baltazar Najjar, MD     SA/MEDQ  D:  07/11/2011  T:  07/11/2011  Job:  [id number]  Electronically Signed by Hannah Beat MD on 07/16/2011 08:22:52 AM

## 2011-07-17 NOTE — Assessment & Plan Note (Signed)
NAME:  [name], [name]                 ACCOUNT NO.:  [id number]  MEDICAL RECORD NO.:  [id number]  LOCATION:                                [facility]  [facility]  PHYSICIAN:  Franchot Gallo, MD     DATE OF BIRTH:  26-Feb-1961  DATE OF ADMISSION:  07/12/2011 DATE OF DISCHARGE:                      PSYCHIATRIC ADMISSION ASSESSMENT   IDENTIFYING INFORMATION:  A 50 year old female admitted on July 12, 2011.  REASON FOR ADMISSION:  Patient is here as a transfer from the medical unit after patient overdosed on an unknown amount of Neurontin, Klonopin, trazodone and Ambien.  The patient reports that she feels like she took approximately 60-70 Neurontin tablets.  She was initially intubated and was found by a friend.  She states that she has chronic suicidal thoughts and states that it was "the thing to do."  She recently lost her brother 1 week ago from a heart attack, which has been a significant stressor for her.  She reports decreased sleep, sleeping about 2 hours a night.  No change in her appetite, although she does report 16 pound weight loss.  PAST PSYCHIATRIC HISTORY:  Patient was here years ago.  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  SOCIAL HISTORY:  A 50 year old single female.  She lives in [place]. She lives alone.  May be possibly losing her home.  She is on disability.  He has no legal problems.  FAMILY HISTORY:  None.  ALCOHOL AND DRUG HISTORY:  She denies any alcohol or substance use.  PRIMARY CARE PROVIDERS:  Dr. Latanya Maudlin at [facility].  MEDICAL PROBLEMS: 1. History of diabetes. 2. Chronic back pain. 3. GERD. 4. Neuropathy.  MEDICATIONS:  The patient's current medications on hold from the medical unit included: 1. Neurontin 300 mg 2 caps b.i.d. 2. Cymbalta 1 daily. 3. Trazodone 100 mg taking 1-2 at bedtime. 4. Lithium carbonate 300 mg 2 tabs b.i.d. 5. Klonopin 1 mg t.i.d. 6. Ibuprofen 200 mg 1-2 q.8 hours p.r.n. 7.  Omeprazole 20 mg p.r.n. 8. She was however, on Lantus insulin10 units at bedtime on the medical floor.  DRUG ALLERGIES:  No known allergies.  LABS:  Hemoglobin 12.2, hematocrit 37.4, platelet count 260.  Sodium of 137 potassium 3.4.  Acetaminophen level less than 15.  Her lithium level was at 0.67, salicylate level less than 2, alcohol level less than 11. Urine drug screen was negative.  MENTAL STATUS EXAM:  The patient is fully alert, somewhat cooperative, very poor eye contact, disheveled.  Her speech is soft spoken, somewhat monotone.  Endorsing suicidal thoughts, but promises safety on the unit. Thought processes:  She denies any psychotic symptoms and does not appear to be responding to any internal stimuli.  Her judgment and insight are poor at this time.  Her cognitive functioning intact. Patient is aware of herself, situation, place and time.  ADMISSION DIAGNOSES: AXIS I:  Major depressive disorder, rule out bipolar disorder, depressed mood. AXIS II:  Deferred. AXIS III:  History of diabetes, chronic back pain and GERD. AXIS IV:  Psychosocial problems related to brother's death, possible problems with housing, medical problems and financial constraints. AXIS V:  Current is 30.  PLAN:  We will continue with monitoring patient's blood sugar.  We will resume patient's Cymbalta and identify her support group.  The patient's case manager offered the patient housing in an assisted living.  Will reinforce med compliance and increase her coping skills.  The patient is also to follow up with her primary care for her medical issues.     Landry Corporal, N.P.   ______________________________ Franchot Gallo, MD    JO/MEDQ  D:  07/13/2011  T:  07/14/2011  Job:  [id number]  cc:   Franchot Gallo, MD  Electronically Signed by Limmie PatriciaP. on 07/17/2011 09:40:38 AM Electronically Signed by Franchot Gallo MD on 07/17/2011 04:59:35 PM

## 2011-07-19 NOTE — Discharge Summary (Signed)
NAME:  [name], [name]                 ACCOUNT NO.:  [id number]  MEDICAL RECORD NO.:  [id number]  LOCATION:  0503                          [facility]ACILITY:  [facility]  PHYSICIAN:  Franchot Gallo, MD     DATE OF BIRTH:  [birth year]  DATE OF ADMISSION:  07/12/2011 DATE OF DISCHARGE:  07/16/2011                              DISCHARGE SUMMARY   REASON FOR ADMISSION:  This is a 50 year old female who was a transfer from the medical floor after the patient overdosed on an unknown amount of Neurontin, Klonopin, trazodone, Ambien.  She was intubated at one point in time.  She was feeling symptoms of feeling very worthless, hopeless, and having no desire to live.  FINAL IMPRESSION:  Axis I:  Major depressive disorder, recurrent. Generalized anxiety disorder. Axis II:  None. Axis III:  History of diabetes and chronic back pain. Axis IV:  Chronic health issues, hearing problems, financial issues. Axis V:  Global Assessment of Functioning at discharge is 70.  SIGNIFICANT FINDINGS:  The patient was admitted to the adult milieu for safety and stabilization.  We continued her on her Lantus for her history of diabetes, monitoring her blood sugars.  She was reporting problems with sleep, sleeping only 2 hours.  Her hopelessness is a 10 on a scale of 1 to 10.  She was having no psychotic symptoms.  We resumed her Cymbalta and Neurontin for her pain and anxiety, Seroquel for sleep and mood stabilization, and Klonopin for anxiety.  She was also reporting problems with ear pain and a sore throat, but had a history of intubation.  No significant findings, but we did do a throat culture that returned negative.  She continued to report problems with sleep, waking up at night with a decreased appetite, having depressive symptoms, rating it a 4 on a scale of 1 to 10.  Her anxiety a 7 to 8 on a scale of 1 to 10.  She was reporting she was unclear why she had overdosed, but did report that she has always had dark thoughts.   We increased her Seroquel to help with her sleep and mood stabilization. The patient's blood sugars remained elevated and we had to make adjustments in her Lantus and sliding scale insulin.  She was beginning to improve, reporting good sleep, good appetite, having mild depressive symptoms, adamantly denied any suicidal thoughts.  Denied any medication side effects.  Her hopelessness was lessening, rating it a 3 on a scale of 1 to 10.  We had discussions with her about assisted living.  On the day of discharge, the patient was seen in the interdisciplinary treatment team.  She was reporting good sleep, good appetite, mild depressive symptoms, rating it a 3 on a scale of 1 to 10.  Adamantly denied any suicidal or homicidal thoughts, or auditory hallucinations or delusional thinking, rating her anxiety a 5 on a scale of 1 to 10, and reporting no medication side effects.  She was wanting to return back to her home, even though we were offered to possibly get her into assisted living, but states that if she wanted to she would follow this up on the outside.  Counselor  did talk with her that it would be effective for 30 days and her primary care provider was notified of this.  DISCHARGE MEDICATIONS: 1. Klonopin 1 mg at 8:00, 2:00 and 10:00. 2. Cymbalta 60 mg 1 daily. 3. Gabapentin 600 mg at 8:00, 2;)0 and 10:00. 4. Seroquel 200 mg at bedtime. 5. Protonix 40 mg daily. 6. Lantus insulin to take 15 units at bedtime.  FOLLOW UP:  Her follow-up appointment was with [facility] walk-in clinic on Friday, October 12th between the hours of 8:00 and 11:00.  An appointment was made with her primary care provider, Dr. Yetta Barre, at [place], phone number [phone] on Monday, October 15th at 2:00 p.m.  Her FL2 as well was also send out to [facility].     Landry Corporal, N.P.   ______________________________ Franchot Gallo, MD    JO/MEDQ  D:  07/17/2011  T:  07/17/2011  Job:   [id number]  Electronically Signed by Limmie PatriciaP. on 07/19/2011 10:53:46 AM Electronically Signed by Franchot Gallo MD on 07/19/2011 07:28:03 PM

## 2011-07-20 ENCOUNTER — Ambulatory Visit: Payer: Medicare Other | Admitting: Internal Medicine

## 2011-07-20 ENCOUNTER — Telehealth: Payer: Self-pay | Admitting: *Deleted

## 2011-07-20 NOTE — Telephone Encounter (Signed)
#   patient left to call back VM was not pt, Left mess to call office back on that # along with pt's cell #. Pt's home # is disconnected.

## 2011-07-20 NOTE — Telephone Encounter (Signed)
Patient requesting a call back regarding thrush.

## 2011-07-21 ENCOUNTER — Telehealth: Payer: Self-pay | Admitting: *Deleted

## 2011-07-21 NOTE — Telephone Encounter (Signed)
See phone note dated 07-21-11.

## 2011-07-21 NOTE — Telephone Encounter (Signed)
Needs to be seen

## 2011-07-21 NOTE — Telephone Encounter (Signed)
Pt left vm requesting Rx for magic mouthwash. She states she has thrush. Please advise

## 2011-07-21 NOTE — Telephone Encounter (Signed)
Pt informed. She states she will go to [facility].

## 2011-07-26 ENCOUNTER — Emergency Department (HOSPITAL_COMMUNITY)
Admission: EM | Admit: 2011-07-26 | Discharge: 2011-07-27 | Disposition: A | Discharge status: Other Acute Inpatient Hospital | Payer: Medicare Other | Source: Home / Self Care | Attending: Emergency Medicine | Admitting: Emergency Medicine

## 2011-07-26 DIAGNOSIS — F3130 Bipolar disorder, current episode depressed, mild or moderate severity, unspecified: Secondary | ICD-10-CM | POA: Insufficient documentation

## 2011-07-26 DIAGNOSIS — T471X1A Poisoning by other antacids and anti-gastric-secretion drugs, accidental (unintentional), initial encounter: Secondary | ICD-10-CM | POA: Insufficient documentation

## 2011-07-26 DIAGNOSIS — Z9119 Patient's noncompliance with other medical treatment and regimen: Secondary | ICD-10-CM | POA: Insufficient documentation

## 2011-07-26 DIAGNOSIS — E119 Type 2 diabetes mellitus without complications: Secondary | ICD-10-CM | POA: Insufficient documentation

## 2011-07-26 DIAGNOSIS — Y92009 Unspecified place in unspecified non-institutional (private) residence as the place of occurrence of the external cause: Secondary | ICD-10-CM | POA: Insufficient documentation

## 2011-07-26 DIAGNOSIS — Z91199 Patient's noncompliance with other medical treatment and regimen due to unspecified reason: Secondary | ICD-10-CM | POA: Insufficient documentation

## 2011-07-26 DIAGNOSIS — T426X1A Poisoning by other antiepileptic and sedative-hypnotic drugs, accidental (unintentional), initial encounter: Secondary | ICD-10-CM | POA: Insufficient documentation

## 2011-07-26 DIAGNOSIS — F411 Generalized anxiety disorder: Secondary | ICD-10-CM | POA: Insufficient documentation

## 2011-07-26 DIAGNOSIS — M549 Dorsalgia, unspecified: Secondary | ICD-10-CM | POA: Insufficient documentation

## 2011-07-26 DIAGNOSIS — T50992A Poisoning by other drugs, medicaments and biological substances, intentional self-harm, initial encounter: Secondary | ICD-10-CM | POA: Insufficient documentation

## 2011-07-26 LAB — DIFFERENTIAL
Basophils Absolute: 0.1 10*3/uL (ref 0.0–0.1)
Basophils Relative: 0 % (ref 0–1)
Eosinophils Absolute: 0.1 10*3/uL (ref 0.0–0.7)
Eosinophils Relative: 1 % (ref 0–5)
Lymphocytes Relative: 19 % (ref 12–46)

## 2011-07-26 LAB — ETHANOL: Alcohol, Ethyl (B): <11 mg/dL (ref 0–11)

## 2011-07-26 LAB — GLUCOSE, CAPILLARY
Glucose-Capillary: >600 mg/dL (ref 70–99)
Glucose-Capillary: 426 mg/dL — ABNORMAL HIGH (ref 70–99)
Glucose-Capillary: 363 mg/dL — ABNORMAL HIGH (ref 70–99)
Glucose-Capillary: 349 mg/dL — ABNORMAL HIGH (ref 70–99)
Glucose-Capillary: 264 mg/dL — ABNORMAL HIGH (ref 70–99)
Glucose-Capillary: 383 mg/dL — ABNORMAL HIGH (ref 70–99)

## 2011-07-26 LAB — URINE MICROSCOPIC-ADD ON

## 2011-07-26 LAB — RAPID URINE DRUG SCREEN, HOSP PERFORMED
Benzodiazepines: NOT DETECTED
Cocaine: NOT DETECTED
Opiates: NOT DETECTED

## 2011-07-26 LAB — COMPREHENSIVE METABOLIC PANEL
Alkaline Phosphatase: 124 U/L — ABNORMAL HIGH (ref 39–117)
BUN: 9 mg/dL (ref 6–23)
CO2: 27 mEq/L (ref 19–32)
Chloride: 88 mEq/L — ABNORMAL LOW (ref 96–112)
Creatinine, Ser: 0.77 mg/dL (ref 0.50–1.10)
GFR calc non Af Amer: >90 mL/min (ref >90)
Glucose, Bld: 680 mg/dL (ref 70–99)
Total Bilirubin: 0.3 mg/dL (ref 0.3–1.2)

## 2011-07-26 LAB — URINALYSIS, ROUTINE W REFLEX MICROSCOPIC
Bilirubin Urine: NEGATIVE
Ketones, ur: NEGATIVE mg/dL
Nitrite: NEGATIVE
Urobilinogen, UA: 0.2 mg/dL (ref 0.0–1.0)

## 2011-07-26 LAB — BASIC METABOLIC PANEL
BUN: 5 mg/dL — ABNORMAL LOW (ref 6–23)
CO2: 28 mEq/L (ref 19–32)
Calcium: 9.5 mg/dL (ref 8.4–10.5)
GFR calc non Af Amer: >90 mL/min (ref >90)
Glucose, Bld: 312 mg/dL — ABNORMAL HIGH (ref 70–99)
Sodium: 134 mEq/L — ABNORMAL LOW (ref 135–145)

## 2011-07-26 LAB — CBC
HCT: 37.7 % (ref 36.0–46.0)
Platelets: 331 10*3/uL (ref 150–400)
RDW: 14 % (ref 11.5–15.5)
WBC: 11.9 10*3/uL — ABNORMAL HIGH (ref 4.0–10.5)

## 2011-07-26 LAB — SALICYLATE LEVEL: Salicylate Lvl: <2 mg/dL — ABNORMAL LOW (ref 2.8–20.0)

## 2011-07-26 LAB — TRICYCLICS SCREEN, URINE: TCA Scrn: NOT DETECTED

## 2011-07-27 ENCOUNTER — Inpatient Hospital Stay (HOSPITAL_COMMUNITY)
Admission: AD | Admit: 2011-07-27 | Discharge: 2011-07-31 | DRG: 885 | Disposition: A | Discharge status: Home / Self Care | Payer: Medicare Other | Source: Ambulatory Visit | Attending: Psychiatry | Admitting: Psychiatry

## 2011-07-27 DIAGNOSIS — Z79899 Other long term (current) drug therapy: Secondary | ICD-10-CM

## 2011-07-27 DIAGNOSIS — F339 Major depressive disorder, recurrent, unspecified: Principal | ICD-10-CM

## 2011-07-27 DIAGNOSIS — R51 Headache: Secondary | ICD-10-CM

## 2011-07-27 DIAGNOSIS — T50902A Poisoning by unspecified drugs, medicaments and biological substances, intentional self-harm, initial encounter: Secondary | ICD-10-CM

## 2011-07-27 DIAGNOSIS — S0003XA Contusion of scalp, initial encounter: Secondary | ICD-10-CM

## 2011-07-27 DIAGNOSIS — M549 Dorsalgia, unspecified: Secondary | ICD-10-CM

## 2011-07-27 DIAGNOSIS — Z794 Long term (current) use of insulin: Secondary | ICD-10-CM

## 2011-07-27 DIAGNOSIS — S0083XA Contusion of other part of head, initial encounter: Secondary | ICD-10-CM

## 2011-07-27 DIAGNOSIS — G8929 Other chronic pain: Secondary | ICD-10-CM

## 2011-07-27 DIAGNOSIS — W19XXXA Unspecified fall, initial encounter: Secondary | ICD-10-CM

## 2011-07-27 DIAGNOSIS — E1142 Type 2 diabetes mellitus with diabetic polyneuropathy: Secondary | ICD-10-CM

## 2011-07-27 DIAGNOSIS — T50901A Poisoning by unspecified drugs, medicaments and biological substances, accidental (unintentional), initial encounter: Secondary | ICD-10-CM

## 2011-07-27 DIAGNOSIS — Z818 Family history of other mental and behavioral disorders: Secondary | ICD-10-CM

## 2011-07-27 DIAGNOSIS — E1149 Type 2 diabetes mellitus with other diabetic neurological complication: Secondary | ICD-10-CM

## 2011-07-27 DIAGNOSIS — F411 Generalized anxiety disorder: Secondary | ICD-10-CM

## 2011-07-27 DIAGNOSIS — R4182 Altered mental status, unspecified: Secondary | ICD-10-CM

## 2011-07-27 LAB — GLUCOSE, CAPILLARY
Glucose-Capillary: 315 mg/dL — ABNORMAL HIGH (ref 70–99)
Glucose-Capillary: 318 mg/dL — ABNORMAL HIGH (ref 70–99)

## 2011-07-28 ENCOUNTER — Ambulatory Visit (HOSPITAL_COMMUNITY)
Admit: 2011-07-28 | Discharge: 2011-07-28 | Disposition: A | Discharge status: Home / Self Care | Payer: Medicare Other | Attending: Psychiatry | Admitting: Psychiatry

## 2011-07-28 ENCOUNTER — Other Ambulatory Visit (HOSPITAL_COMMUNITY): Payer: Medicare Other

## 2011-07-28 DIAGNOSIS — Z9181 History of falling: Secondary | ICD-10-CM | POA: Insufficient documentation

## 2011-07-28 DIAGNOSIS — F411 Generalized anxiety disorder: Secondary | ICD-10-CM

## 2011-07-28 DIAGNOSIS — R4182 Altered mental status, unspecified: Secondary | ICD-10-CM | POA: Insufficient documentation

## 2011-07-28 DIAGNOSIS — F339 Major depressive disorder, recurrent, unspecified: Secondary | ICD-10-CM

## 2011-07-28 LAB — GLUCOSE, CAPILLARY
Glucose-Capillary: 387 mg/dL — ABNORMAL HIGH (ref 70–99)
Glucose-Capillary: 315 mg/dL — ABNORMAL HIGH (ref 70–99)
Glucose-Capillary: 394 mg/dL — ABNORMAL HIGH (ref 70–99)

## 2011-07-29 LAB — GLUCOSE, CAPILLARY
Glucose-Capillary: 284 mg/dL — ABNORMAL HIGH (ref 70–99)
Glucose-Capillary: 422 mg/dL — ABNORMAL HIGH (ref 70–99)

## 2011-07-30 LAB — GLUCOSE, CAPILLARY
Glucose-Capillary: 377 mg/dL — ABNORMAL HIGH (ref 70–99)
Glucose-Capillary: 391 mg/dL — ABNORMAL HIGH (ref 70–99)
Glucose-Capillary: 313 mg/dL — ABNORMAL HIGH (ref 70–99)

## 2011-07-31 ENCOUNTER — Ambulatory Visit (HOSPITAL_COMMUNITY)
Admission: EM | Admit: 2011-07-31 | Discharge: 2011-07-31 | Disposition: A | Discharge status: Home / Self Care | Payer: Medicare Other | Source: Ambulatory Visit | Attending: Emergency Medicine | Admitting: Emergency Medicine

## 2011-08-04 NOTE — Discharge Summary (Signed)
  NAME:  [name], [name]                 ACCOUNT NO.:  [id number]  MEDICAL RECORD NO.:  [id number]  LOCATION:  0502                          [facility]ILITY[facility]  [facility]  PHYSICIAN:  Franchot Gallo, MD     DATE OF BIRTH:  [birth year]  DATE OF ADMISSION:  07/27/2011 DATE OF DISCHARGE:  07/31/2011                              DISCHARGE SUMMARY   REASON FOR ADMISSION:  The patient is a 50 year old female who presented voluntarily and overdosed on a handful of prescribed medications.  She had fallen at that time and reported that this was another suicide attempt.  FINAL IMPRESSION:  Axis I:  Major depressive disorder, recurrent; generalized anxiety disorder. Axis II:  None. Axis III:  Insulin-dependent diabetes mellitus, chronic back pain, obesity. Axis IV:  A limited primary support system; chronic health issues. Axis V:  65 on discharge.  PERTINENT PHYSICAL FINDINGS:  This is a morbidly obese female.  Her vital signs are stable.  Her blood sugar initially was greater than 600; down to 300 at the time of transfer.  HOSPITAL COURSE:  The patient was admitted to the __adult milieu_for safety and stabilization.  We were monitoring her blood sugar, continuing her prior medications.  A Diabetic Consult nurse came to make changes to her insulin.  The patient had a hemoglobin A1c of 11.3 at that time. She was also sent out for a CAT scan to address the fact that she was not feeling well and had had a recent fall.  The patient was reporting good sleep and appetite; had mild depressive symptoms ; denied any suicidal or homicidal thoughts or delusional thinking, having no medication side effects.  The CAT scan  came back negative, and we ordered ibuprofen for complaints of a headache.  The patient refused any contact with family or significant other.  We also added Seroquel for anxiety; added metformin and Januvia for her diabetes and .  On the day of discharge, the patient was reporting good sleep and  appetite.  Her depression was very mild, rating it as a 2 on a scale of 1 to 10, denying any suicidal or homicidal thoughts or psychotic symptoms, rating her anxiety as moderate at 5 on a scale of 1 to 10. Her discharge medications included amoxicillin 875 mg one b.i.d., insulin 25 units q.h.s., Januvia 100 mg daily; metformin 500 mg, taking two b.i.d.; Seroquel 25 mg, one at 8 and one at 2 p.m.; Cymbalta 60 mg one daily, gabapentin 600 mg one t.i.d.; Klonopin 1 mg at 8, 2, and 10; Prozac one daily, Seroquel 200 mg at bedtime for sleep and calm thoughts.  Her follow-up appointment was at [facility] the walk- in clinic (phone number [phone]), and at [facility] to see Dr. Sherryll Burger (phone number [phone]) on December 18th at 3:15.     Landry Corporal, N.P.   ______________________________ Franchot Gallo, MD    JO/MEDQ  D:  08/03/2011  T:  08/03/2011  Job:  [id number]  Electronically Signed by Limmie PatriciaP. on 08/04/2011 09:19:43 AM Electronically Signed by Franchot Gallo MD on 08/04/2011 03:13:06 PM

## 2011-08-05 NOTE — Assessment & Plan Note (Signed)
NAME:  [name], [name]                 ACCOUNT NO.:  [id number]  MEDICAL RECORD NO.:  [id number]  LOCATION:  0501                          FACILITY:  [facility]  PHYSICIAN:  Franchot Gallo, MD     DATE OF BIRTH:  18-Dec-1960  DATE OF ADMISSION:  07/27/2011 DATE OF DISCHARGE:                      PSYCHIATRIC ADMISSION ASSESSMENT   This is a voluntary admission to the services of Dr. Harvie Heck [place]. This is a 50 year old, divorced, white female.  She apparently argued with her daughter on the phone.  She said she has not seen her daughter for a year and a half.  Her daughter told her "that she is just too busy."  This made the patient "fall apart."  She took a handful of her prescribed medicines.  This caused her to be groggy, she fell hitting her head, and so she called EMS.  She does state that this was another suicide attempt.  She was just with [place] July 09, 2011, to July 16, 2011.  She overdosed that time because her brother had recently died from an MI.  She overdosed again on her prescribed meds and she required intubation after suffering acute respiratory failure.  She was hospitalized in the main hospital July 09, 2011, to July 12, 2011, being transferred to [facility] once she was medically stabilized.  She has had 1 other inpatient admission that was back in 2008.  She was here 12/02/2006 to 12/10/2006.  OUTPATIENT CARE:  She did go to her initial intake appointment at [facility] post discharge.  SOCIAL HISTORY:  She has some college.  She has been married and divorced twice.  She was last employed in 2004.  She was a Musician.  She quit to take care of her mother who died later that year.  She states that she has never quite gotten it back together since then.  She has a daughter, 74.  She has 2 other daughters, 49 and 73; they live with their father.  Her 13 year old is on her own.  FAMILY HISTORY:  Both of her parents had depression.  Maternal  aunt suicided when she was about 64 years old; she shot herself in the head.  ALCOHOL AND DRUG HISTORY:  She denies.  PRIMARY CARE PROVIDER:  Dr. Sanda Linger.  Her psychiatric care will be through [facility].  Diabetes mellitus, type 2, was diagnosed in 2006.  She has peripheral neuropathy from this and she also has chronic back pain.  She is status post a nerve ablation and steroid injections in her back.  MEDICATIONS:  She notes that she is currently prescribed: 1. Klonopin 1 mg at 8 a.m., 2 p.m., 10 p.m. 2. Cymbalta 60 mg one p.o. q. day. 3. Gabapentin 600 mg at 8 a.m., 2 p.m., and 10 p.m. 4. Seroquel 200 mg at h.s. 5. Protonix 40 mg p.o. q. day. 6. Lantus insulin 15 units as h.s.  DRUG ALLERGIES:  She has NO KNOWN DRUG ALLERGIES.  POSITIVE PHYSICAL FINDINGS:  Morbidly obese white female greater than 300 pounds.  She was afebrile, 97.4 to 98.7.  Her pulse ranged from 87 to about 104.  Respirations were 14 to 20.  Blood pressure varied  from 112/72 all the way up to 175/83.  Initially, her blood glucose was greater than 600, it came down to 426, to a low of 186.  She had stopped taking her diabetes medicine for about 3 days.  She was stabilized this time in the emergency room.  Other pertinent positives are she had a bladder sling in 1999.  She states she has had numerous stitches throughout her life.  She is currently going through premenopausal changes.  She has only had 2 cycles this year.  Right now she is complaining of headache.  She does have a bruise to the right temporal area which is consistent with her reported history for having fallen the other day.  MENTAL STATUS EXAM:  Tonight she is alert and oriented.  She is somewhat unkempt.  She is remarkably depressed appearing although she can move. She is not neurovegetative.  Her speech was not pressured.  Her mood is depressed.  Her affect is congruent.  Her thought processes are clear, rational, and goal oriented.  She  does not want to have to turn her car into the bank in order to go to assisted living for a few months.  She denies being suicidal or homicidal at this time.  She denies auditory/visual hallucinations.  Judgment and insight are fair. Concentration and memory superficially are intact.  Intelligence is at least average.  DIAGNOSIS:  AXIS I:  Mood disorder, not otherwise specified. AXIS II:  Relationship issues. AXIS III: 1. Morbid obesity. 2. Diabetes, type 2, with peripheral neuropathy. 3. Chronic back pain. AXIS IV:  Support. AXIS V:  33.  PLAN:  Admit for safety and stabilization.  She needs to develop better coping skills.  Maybe she can get into some sort of intensive outpatient program to help her develop better coping skills.     Mickie Leonarda Salon, P.A.-C.   ______________________________ Franchot Gallo, MD    MD/MEDQ  D:  07/27/2011  T:  07/28/2011  Job:  [id number]  Electronically Signed by Jaci Lazier ADAMS P.A.-C. on 08/04/2011 07:06:55 PM Electronically Signed by Franchot Gallo MD on 08/05/2011 08:27:19 AM

## 2011-10-07 ENCOUNTER — Other Ambulatory Visit: Payer: Self-pay | Admitting: Internal Medicine

## 2011-10-08 ENCOUNTER — Other Ambulatory Visit: Payer: Self-pay

## 2011-10-08 ENCOUNTER — Emergency Department (HOSPITAL_COMMUNITY): Payer: Medicare Other

## 2011-10-08 ENCOUNTER — Inpatient Hospital Stay (HOSPITAL_COMMUNITY)
Admission: EM | Admit: 2011-10-08 | Discharge: 2011-10-10 | DRG: 392 | Disposition: A | Discharge status: Home / Self Care | Payer: Medicare Other | Attending: Internal Medicine | Admitting: Internal Medicine

## 2011-10-08 ENCOUNTER — Encounter (HOSPITAL_COMMUNITY): Payer: Self-pay | Admitting: *Deleted

## 2011-10-08 DIAGNOSIS — E785 Hyperlipidemia, unspecified: Secondary | ICD-10-CM

## 2011-10-08 DIAGNOSIS — K3184 Gastroparesis: Secondary | ICD-10-CM | POA: Diagnosis present

## 2011-10-08 DIAGNOSIS — Z6841 Body Mass Index (BMI) 40.0 and over, adult: Secondary | ICD-10-CM

## 2011-10-08 DIAGNOSIS — K269 Duodenal ulcer, unspecified as acute or chronic, without hemorrhage or perforation: Secondary | ICD-10-CM | POA: Diagnosis present

## 2011-10-08 DIAGNOSIS — E1149 Type 2 diabetes mellitus with other diabetic neurological complication: Secondary | ICD-10-CM | POA: Diagnosis present

## 2011-10-08 DIAGNOSIS — Z794 Long term (current) use of insulin: Secondary | ICD-10-CM

## 2011-10-08 DIAGNOSIS — Z860100 Personal history of colon polyps, unspecified: Secondary | ICD-10-CM

## 2011-10-08 DIAGNOSIS — R55 Syncope and collapse: Secondary | ICD-10-CM | POA: Diagnosis present

## 2011-10-08 DIAGNOSIS — F17200 Nicotine dependence, unspecified, uncomplicated: Secondary | ICD-10-CM | POA: Diagnosis present

## 2011-10-08 DIAGNOSIS — K2970 Gastritis, unspecified, without bleeding: Principal | ICD-10-CM | POA: Diagnosis present

## 2011-10-08 DIAGNOSIS — I10 Essential (primary) hypertension: Secondary | ICD-10-CM | POA: Diagnosis present

## 2011-10-08 DIAGNOSIS — F411 Generalized anxiety disorder: Secondary | ICD-10-CM | POA: Diagnosis present

## 2011-10-08 DIAGNOSIS — T3995XA Adverse effect of unspecified nonopioid analgesic, antipyretic and antirheumatic, initial encounter: Secondary | ICD-10-CM | POA: Diagnosis present

## 2011-10-08 DIAGNOSIS — Z6379 Other stressful life events affecting family and household: Secondary | ICD-10-CM

## 2011-10-08 DIAGNOSIS — K279 Peptic ulcer, site unspecified, unspecified as acute or chronic, without hemorrhage or perforation: Secondary | ICD-10-CM

## 2011-10-08 DIAGNOSIS — K219 Gastro-esophageal reflux disease without esophagitis: Secondary | ICD-10-CM

## 2011-10-08 DIAGNOSIS — F410 Panic disorder [episodic paroxysmal anxiety] without agoraphobia: Secondary | ICD-10-CM | POA: Diagnosis present

## 2011-10-08 DIAGNOSIS — E119 Type 2 diabetes mellitus without complications: Secondary | ICD-10-CM

## 2011-10-08 DIAGNOSIS — Z79899 Other long term (current) drug therapy: Secondary | ICD-10-CM

## 2011-10-08 DIAGNOSIS — F3130 Bipolar disorder, current episode depressed, mild or moderate severity, unspecified: Secondary | ICD-10-CM | POA: Diagnosis present

## 2011-10-08 DIAGNOSIS — K2980 Duodenitis without bleeding: Secondary | ICD-10-CM | POA: Diagnosis present

## 2011-10-08 DIAGNOSIS — E669 Obesity, unspecified: Secondary | ICD-10-CM | POA: Diagnosis present

## 2011-10-08 DIAGNOSIS — R1013 Epigastric pain: Secondary | ICD-10-CM

## 2011-10-08 LAB — DIFFERENTIAL
Basophils Absolute: 0 10*3/uL (ref 0.0–0.1)
Basophils Relative: 0 % (ref 0–1)
Eosinophils Absolute: 0 10*3/uL (ref 0.0–0.7)
Eosinophils Relative: 0 % (ref 0–5)
Lymphocytes Relative: 32 % (ref 12–46)
Monocytes Absolute: 0.7 10*3/uL (ref 0.1–1.0)

## 2011-10-08 LAB — URINALYSIS, ROUTINE W REFLEX MICROSCOPIC
Nitrite: NEGATIVE
Specific Gravity, Urine: 1.038 — ABNORMAL HIGH (ref 1.005–1.030)
Urobilinogen, UA: 0.2 mg/dL (ref 0.0–1.0)

## 2011-10-08 LAB — CARDIAC PANEL(CRET KIN+CKTOT+MB+TROPI)
Relative Index: INVALID (ref 0.0–2.5)
Total CK: 27 U/L (ref 7–177)
Troponin I: <0.3 ng/mL (ref <0.30)

## 2011-10-08 LAB — HEMOGLOBIN A1C: Mean Plasma Glucose: 315 mg/dL — ABNORMAL HIGH (ref <117)

## 2011-10-08 LAB — CBC
HCT: 43.1 % (ref 36.0–46.0)
MCH: 28.9 pg (ref 26.0–34.0)
MCHC: 34.8 g/dL (ref 30.0–36.0)
MCV: 83 fL (ref 78.0–100.0)
RDW: 13.9 % (ref 11.5–15.5)

## 2011-10-08 LAB — COMPREHENSIVE METABOLIC PANEL
AST: 13 U/L (ref 0–37)
CO2: 21 mEq/L (ref 19–32)
Calcium: 11.7 mg/dL — ABNORMAL HIGH (ref 8.4–10.5)
Creatinine, Ser: 0.65 mg/dL (ref 0.50–1.10)
GFR calc Af Amer: >90 mL/min (ref >90)
GFR calc non Af Amer: >90 mL/min (ref >90)

## 2011-10-08 LAB — LIPASE, BLOOD: Lipase: 15 U/L (ref 11–59)

## 2011-10-08 LAB — GLUCOSE, CAPILLARY
Glucose-Capillary: 396 mg/dL — ABNORMAL HIGH (ref 70–99)
Glucose-Capillary: 416 mg/dL — ABNORMAL HIGH (ref 70–99)

## 2011-10-08 LAB — D-DIMER, QUANTITATIVE: D-Dimer, Quant: 0.67 ug/mL-FEU — ABNORMAL HIGH (ref 0.00–0.48)

## 2011-10-08 LAB — URINE MICROSCOPIC-ADD ON

## 2011-10-08 MED ORDER — METFORMIN HCL 500 MG PO TABS
1000.0000 mg | ORAL_TABLET | Freq: Two times a day (BID) | ORAL | Status: DC
  Administered 2011-10-10 – 2011-10-11 (×3): 1000 mg via ORAL
  Filled 2011-10-08 (×7): qty 2

## 2011-10-08 MED ORDER — GABAPENTIN 300 MG PO CAPS
600.0000 mg | ORAL_CAPSULE | Freq: Two times a day (BID) | ORAL | Status: DC
  Administered 2011-10-08 – 2011-10-11 (×6): 600 mg via ORAL
  Filled 2011-10-08 (×7): qty 2

## 2011-10-08 MED ORDER — QUETIAPINE FUMARATE 25 MG PO TABS
25.0000 mg | ORAL_TABLET | ORAL | Status: DC
  Administered 2011-10-09 – 2011-10-11 (×4): 25 mg via ORAL
  Filled 2011-10-08 (×6): qty 1

## 2011-10-08 MED ORDER — IOHEXOL 300 MG/ML  SOLN
86.0000 mL | Freq: Once | INTRAMUSCULAR | Status: AC | PRN
  Administered 2011-10-08: 86 mL via INTRAVENOUS

## 2011-10-08 MED ORDER — HYDROMORPHONE HCL PF 1 MG/ML IJ SOLN
1.0000 mg | Freq: Once | INTRAMUSCULAR | Status: AC
  Administered 2011-10-08: 1 mg via INTRAVENOUS
  Filled 2011-10-08: qty 1

## 2011-10-08 MED ORDER — DULOXETINE HCL 60 MG PO CPEP
60.0000 mg | ORAL_CAPSULE | Freq: Every day | ORAL | Status: DC
  Administered 2011-10-08 – 2011-10-11 (×4): 60 mg via ORAL
  Filled 2011-10-08 (×4): qty 1

## 2011-10-08 MED ORDER — INSULIN ASPART 100 UNIT/ML ~~LOC~~ SOLN
0.0000 [IU] | Freq: Three times a day (TID) | SUBCUTANEOUS | Status: DC
  Administered 2011-10-08 – 2011-10-09 (×2): 11 [IU] via SUBCUTANEOUS
  Administered 2011-10-09: 8 [IU] via SUBCUTANEOUS
  Administered 2011-10-09: 15 [IU] via SUBCUTANEOUS
  Administered 2011-10-10: 8 [IU] via SUBCUTANEOUS
  Administered 2011-10-10: 5 [IU] via SUBCUTANEOUS
  Filled 2011-10-08 (×2): qty 3

## 2011-10-08 MED ORDER — INSULIN ASPART 100 UNIT/ML ~~LOC~~ SOLN
5.0000 [IU] | Freq: Once | SUBCUTANEOUS | Status: AC
  Administered 2011-10-08: 5 [IU] via SUBCUTANEOUS
  Filled 2011-10-08: qty 1

## 2011-10-08 MED ORDER — INSULIN GLARGINE 100 UNIT/ML ~~LOC~~ SOLN
10.0000 [IU] | Freq: Every day | SUBCUTANEOUS | Status: DC
  Administered 2011-10-08: 10 [IU] via SUBCUTANEOUS
  Filled 2011-10-08: qty 3

## 2011-10-08 MED ORDER — INSULIN ASPART 100 UNIT/ML ~~LOC~~ SOLN
10.0000 [IU] | Freq: Once | SUBCUTANEOUS | Status: AC
  Administered 2011-10-08: 10 [IU] via SUBCUTANEOUS
  Filled 2011-10-08 (×2): qty 1

## 2011-10-08 MED ORDER — ACETAMINOPHEN 325 MG PO TABS
650.0000 mg | ORAL_TABLET | Freq: Four times a day (QID) | ORAL | Status: DC | PRN
  Administered 2011-10-09: 650 mg via ORAL
  Filled 2011-10-08: qty 2

## 2011-10-08 MED ORDER — GI COCKTAIL ~~LOC~~
30.0000 mL | Freq: Once | ORAL | Status: AC
  Administered 2011-10-08: 30 mL via ORAL
  Filled 2011-10-08: qty 30

## 2011-10-08 MED ORDER — ONDANSETRON HCL 4 MG/2ML IJ SOLN
4.0000 mg | Freq: Four times a day (QID) | INTRAMUSCULAR | Status: DC | PRN
Start: 2011-10-08 — End: 2011-10-11
  Administered 2011-10-08: 4 mg via INTRAVENOUS
  Filled 2011-10-08: qty 2

## 2011-10-08 MED ORDER — QUETIAPINE FUMARATE 300 MG PO TABS
300.0000 mg | ORAL_TABLET | Freq: Every day | ORAL | Status: DC
Start: 2011-10-08 — End: 2011-10-11
  Administered 2011-10-08 – 2011-10-10 (×3): 300 mg via ORAL
  Filled 2011-10-08 (×4): qty 1

## 2011-10-08 MED ORDER — ACETAMINOPHEN 650 MG RE SUPP: 650.0000 mg | Freq: Four times a day (QID) | RECTAL | Status: DC | PRN

## 2011-10-08 MED ORDER — KETOROLAC TROMETHAMINE 30 MG/ML IJ SOLN
30.0000 mg | Freq: Three times a day (TID) | INTRAMUSCULAR | Status: DC | PRN
  Administered 2011-10-08 – 2011-10-10 (×3): 30 mg via INTRAVENOUS
  Filled 2011-10-08 (×3): qty 1

## 2011-10-08 MED ORDER — PANTOPRAZOLE SODIUM 40 MG PO TBEC
40.0000 mg | DELAYED_RELEASE_TABLET | Freq: Every day | ORAL | Status: DC
  Administered 2011-10-08 – 2011-10-10 (×3): 40 mg via ORAL
  Filled 2011-10-08 (×3): qty 1

## 2011-10-08 MED ORDER — ONDANSETRON HCL 4 MG/2ML IJ SOLN
4.0000 mg | Freq: Once | INTRAMUSCULAR | Status: AC
  Administered 2011-10-08: 4 mg via INTRAVENOUS
  Filled 2011-10-08: qty 2

## 2011-10-08 MED ORDER — SODIUM CHLORIDE 0.9 % IV SOLN
INTRAVENOUS | Status: AC
  Administered 2011-10-08 – 2011-10-09 (×2): via INTRAVENOUS

## 2011-10-08 MED ORDER — ASPIRIN 81 MG PO CHEW
324.0000 mg | CHEWABLE_TABLET | Freq: Once | ORAL | Status: AC
  Administered 2011-10-08: 324 mg via ORAL
  Filled 2011-10-08: qty 4

## 2011-10-08 MED ORDER — ENOXAPARIN SODIUM 40 MG/0.4ML ~~LOC~~ SOLN
40.0000 mg | Freq: Every day | SUBCUTANEOUS | Status: DC
  Administered 2011-10-08 – 2011-10-09 (×2): 40 mg via SUBCUTANEOUS
  Filled 2011-10-08 (×3): qty 0.4

## 2011-10-08 MED ORDER — ONDANSETRON HCL 4 MG PO TABS
4.0000 mg | ORAL_TABLET | Freq: Four times a day (QID) | ORAL | Status: DC | PRN
  Administered 2011-10-09 – 2011-10-10 (×3): 4 mg via ORAL
  Filled 2011-10-08 (×3): qty 1

## 2011-10-08 MED ORDER — INSULIN ASPART 100 UNIT/ML ~~LOC~~ SOLN
0.0000 [IU] | Freq: Every day | SUBCUTANEOUS | Status: DC
  Administered 2011-10-08: 5 [IU] via SUBCUTANEOUS
  Administered 2011-10-09: 3 [IU] via SUBCUTANEOUS
  Filled 2011-10-08: qty 3

## 2011-10-08 MED ORDER — INSULIN REGULAR HUMAN 100 UNIT/ML IJ SOLN: 5.0000 [IU] | Freq: Once | INTRAMUSCULAR | Status: DC

## 2011-10-08 MED ORDER — SODIUM CHLORIDE 0.9 % IV BOLUS (SEPSIS)
500.0000 mL | Freq: Once | INTRAVENOUS | Status: AC
  Administered 2011-10-08: 500 mL via INTRAVENOUS

## 2011-10-08 NOTE — ED Notes (Signed)
Patient resting and states she is still havng abdominal pain and nausea. Patient with NAD at this time. Patient remains on monitor and oxygen with saturation of 98% on 3L.

## 2011-10-08 NOTE — ED Notes (Signed)
Patient resting and watching TV with NAD at this time.

## 2011-10-08 NOTE — ED Notes (Signed)
To Ultrasound

## 2011-10-08 NOTE — ED Notes (Signed)
First meeting patient. Patient states she has had epigastric abdominal pain x 2 days. Patient denies chest pain, N/V or fever. Patient on remains on monitor and if resting with NAD at this time.

## 2011-10-08 NOTE — ED Notes (Addendum)
Patient states pain in epigastric area getting worse. Patient still experiencing nausea. Patient remains on monitor and oxygen saturation at 97% on 3L.

## 2011-10-08 NOTE — ED Notes (Signed)
Patient reports pain has increased and nausea. Patient remains on monitor and oxygen with saturation 98% on 3L.  NAD at this time.

## 2011-10-08 NOTE — ED Notes (Signed)
Patient states she is having more abdominal pain and feel sick. Patient remains on monitor and oxygen 3L saturation of 97%.

## 2011-10-08 NOTE — ED Provider Notes (Signed)
History     CSN: [id number]  Arrival date & time 10/08/11  0550   First MD Initiated Contact with Patient 10/08/11 [phone]      Chief Complaint  Patient presents with  . Abdominal Pain  . Loss of Consciousness    (Consider location/radiation/quality/duration/timing/severity/associated sxs/prior treatment) HPI  Patient who [facility] to the [facility] as PCP with hx of insulin dependent diabetes presents to the ER complaining of a  1 month hx of epigastric pain with associated nausea aggravated by eating stating that she has been having increasing abdominal pain through night last night and was feeling hot and sweaty then she got up to use bathroom and had a brief LOC, syncopizing to ground and approximates that she "came to" with in a few minutes. Patient is complaining of ongoing epigastric pain. Denies hx of abdominal surgeries. She states she was told by PCP that she has been having increasing BP but that no meds have been added to her regimen. She denies CP, SOB, HA, visual changes, extremity numbness/tingling/weakness, incontinence, neck pain, back pain, extremity injury. Patient states "I feel like my heart is beating out of my chest." denies known fevers. Patient states nausea with eating but denies vomiting, diarrhea or blood in stool. Pain aggravated by eating, denies alleviating factors. Symptoms have been daily with waxing and waning pain with acute onset syncope this morning.   Past Medical History  Diagnosis Date  . Anemia     iron deficiency  . Anxiety   . Depression   . Diabetes mellitus     type 2  . Hyperlipidemia   . Hypertension   . GERD (gastroesophageal reflux disease)   . Headache   . Peptic ulcer disease   . History of colonic polyps     History reviewed. No pertinent past surgical history.  Family History  Problem Relation Age of Onset  . Alcohol abuse Other   . Drug abuse Other   . Arthritis Other   . Diabetes Other   . Depression Other   .  Hypertension Other   . Kidney disease Other   . Cancer Other     Breast,Colon,Lung, and Prostate Cancer    History  Substance Use Topics  . Smoking status: Current Everyday Smoker -- 1.0 packs/day for 25 years    Types: Cigarettes  . Smokeless tobacco: Not on file  . Alcohol Use: No    OB History    Grav Para Term Preterm Abortions TAB SAB Ect Mult Living                  Review of Systems  All other systems reviewed and are negative.    Allergies  Review of patient's allergies indicates no active allergies.  Home Medications   Current Outpatient Rx  Name Route Sig Dispense Refill  . DULOXETINE HCL 60 MG PO CPEP Oral Take 60 mg by mouth 2 (two) times daily.      Marland Kitchen GABAPENTIN 300 MG PO CAPS Oral Take 600 mg by mouth 2 (two) times daily.      . INSULIN ASPART 100 UNIT/ML [place] SOLN Subcutaneous Inject 10-25 Units into the skin 3 (three) times daily before meals. As directed per sliding scale 30 mL 3  . METFORMIN HCL 1000 MG PO TABS  TAKE 1 TABLET BY MOUTH TWICE DAILY 60 tablet 2    BP 142/80  Pulse 89  Temp(Src) 98.1 F (36.7 C) (Oral)  Resp 22  SpO2 98%  Physical Exam  Nursing note and vitals reviewed. Constitutional: She is oriented to person, place, and time. She appears well-developed and well-nourished. No distress.       Morbidly obese  HENT:  Head: Normocephalic and atraumatic.  Eyes: Conjunctivae and EOM are normal. Pupils are equal, round, and reactive to light.  Neck: Normal range of motion. Neck supple.  Cardiovascular: Normal rate, regular rhythm, normal heart sounds and intact distal pulses.  Exam reveals no gallop and no friction rub.   No murmur heard. Pulmonary/Chest: Effort normal and breath sounds normal. No respiratory distress. She has no wheezes. She has no rales. She exhibits no tenderness.  Abdominal: Bowel sounds are normal. She exhibits no distension and no mass. There is tenderness. There is no rebound and no guarding.       Mild to  moderate TTP of epigastric region. Mild TTP of RUQ. Entire lower abdomen non tender.   Musculoskeletal: Normal range of motion. She exhibits no edema and no tenderness.  Neurological: She is alert and oriented to person, place, and time.  Skin: Skin is warm and dry. No rash noted. She is not diaphoretic. No erythema.  Psychiatric: She has a normal mood and affect.    ED Course  Procedures (including critical care time)  PO ASA, IV dilaudid and zofran, IV fluids.    Date: 10/08/2011  Rate: 123  Rhythm: sinus tachycardia  QRS Axis: normal  Intervals: normal  ST/T Wave abnormalities: normal  Conduction Disutrbances:none  Narrative Interpretation:   Old EKG Reviewed: new sinus tach but no other signifcant changes compared to Jul 26, 2011    Labs Reviewed  CBC - Abnormal; Notable for the following:    RBC 5.19 (*)    All other components within normal limits  COMPREHENSIVE METABOLIC PANEL - Abnormal; Notable for the following:    Sodium 131 (*)    Chloride 93 (*)    Glucose, Bld 446 (*)    Calcium 11.7 (*)    Alkaline Phosphatase 122 (*)    All other components within normal limits  URINALYSIS, ROUTINE W REFLEX MICROSCOPIC - Abnormal; Notable for the following:    Specific Gravity, Urine 1.038 (*)    Glucose, UA >1000 (*)    Hgb urine dipstick SMALL (*)    Bilirubin Urine MODERATE (*)    Ketones, ur >80 (*)    All other components within normal limits  GLUCOSE, CAPILLARY - Abnormal; Notable for the following:    Glucose-Capillary 396 (*)    All other components within normal limits  URINE MICROSCOPIC-ADD ON - Abnormal; Notable for the following:    Squamous Epithelial / LPF MANY (*)    Bacteria, UA FEW (*)    All other components within normal limits  GLUCOSE, CAPILLARY - Abnormal; Notable for the following:    Glucose-Capillary 322 (*)    All other components within normal limits  DIFFERENTIAL  POCT I-STAT TROPONIN I  POCT PREGNANCY, URINE  POCT PREGNANCY, URINE    I-STAT TROPONIN I  POCT CBG MONITORING  POCT CBG MONITORING   Dg Chest 2 View  10/08/2011  *RADIOLOGY REPORT*  Clinical Data: Epigastric/abdominal pain.  Shortness of breath. Hypertension.  Nausea.  CHEST - 2 VIEW  Comparison: 07/10/2011  Findings: Midline trachea.  Normal heart size and mediastinal contours. No pleural effusion or pneumothorax.  Clear lungs.  IMPRESSION: Normal chest.  Original Report Authenticated By: Consuello Bossier, M.D.   US Abdomen Complete  10/08/2011  *RADIOLOGY REPORT*  Clinical Data:  Epigastric  abdominal pain.  COMPLETE ABDOMINAL ULTRASOUND  Comparison:  None  Findings:  Gallbladder:  No gallstones, gallbladder wall thickening, or pericholecystic fluid.  Common bile duct:  Normal in caliber measuring a maximum of 5.50mm.  Liver:  There is diffuse increased echogenicity of the liver and decreased through transmission consistent with fatty infiltration. No focal lesions or biliary dilatation.  IVC:  Normal caliber.  Pancreas:  Not visualized due to overlying bowel gas.  Spleen:  Normal size and echogenicity without focal lesions.  Right Kidney:  13.2 cm in length. Normal renal cortical thickness and echogenicity without focal lesions or hydronephrosis.  Left Kidney:  12.9 cm in length. Normal renal cortical thickness and echogenicity without focal lesions or hydronephrosis.  Abdominal aorta:  Normal caliber  IMPRESSION:  1.  Normal gallbladder and normal caliber common bile duct. 2.  Diffuse fatty infiltration of the liver. 3.  Non-visualization of the pancreas. 4.  Normal sonographic appearance of the spleen and both kidneys.  Original Report Authenticated By: P. Loralie Champagne, M.D.     1. Epigastric pain   2. Diabetes mellitus   3. Syncope       MDM  OPC to admit for abdominal pain, syncope, CP rule out and poorly controlled diabetes. VSS.         [place], [place] 10/08/11 1337  Medical screening examination/treatment/procedure(s) were performed by  non-physician practitioner and as supervising physician I was immediately available for consultation/collaboration.   Sunnie Nielsen, MD 10/08/11 [phone]

## 2011-10-08 NOTE — ED Notes (Signed)
[id number]

## 2011-10-08 NOTE — ED Notes (Signed)
Patient resting with NAD at this time. Watching TV and talking with staff.

## 2011-10-08 NOTE — H&P (Signed)
Hospital Admission Note Date: 10/08/2011  Patient name: [name] Medical record number: [id number] Date of birth: [birth year] Age: 51 y.o. Gender: female PCP: Sanda Linger, MD, MD  Medical Service: Internal Medicine Teaching Service  Chief Complaint: Epigastric pain, syncope  History of Present Illness: Patient is a 51 year old woman with a history of depression and peptic ulcer disease who presents with one month of worsening epigastric pain and anorexia along with nearly a 30 lb weight loss that occurred in the last 2 months. She also has associated nausea and vomiting/spitting up of non-bloody, non-bilious yellowish stomach fluids. She has had gastric reflux for nearly the past 20 years, but she says that this pain feels different from her normal pain which is more of a heaviness. Her current pain comes and goes and is not related to food intake. Patient uses 6-8 OTC tablets of ibuprofen each day for headache and backache as well as her epigastric pain. She also drinks 2 liters of cola each day. She has been eating only crackers and soup occasionally and says she has been taking at most 1 evening meal a day.   She also describes an episode of syncope that occurred last evening when she was walking to the bathroom. She says she felt panicky, nauseous and dizzy and fell to the floor. She denies any tongue biting or loss of bowel/bladder control. She denies any neurological deficits after the event and denies feeling confused after awakening. Before her episode of syncope, she reports feeling like she was trebling, sweaty and numb. She reports a history of panic attacks that have been occuring about once a week as of late. She denies any history of seizure or stroke. She has had an episode of syncope before which occurred in the setting of a flu-like illness.  Notably, patient had two previous admissions for suicidality in October (10/4-10/11 and 10/22-10/26) with medication overdoses secondary to life  stressors. Her medication regimen was changed at that time. Her lithium, trazodone and klonopin were stopped at that time and seroquel was started.  Meds:  Medications Prior to Admission  Medication Sig Dispense Refill  . DULoxetine (CYMBALTA) 60 MG capsule Take 60 mg by mouth daily.       Marland Kitchen gabapentin (NEURONTIN) 300 MG capsule Take 600 mg by mouth 2 (two) times daily.        . insulin aspart (NOVOLOG FLEXPEN) 100 UNIT/ML injection Inject 10-25 Units into the skin 3 (three) times daily before meals. As directed per sliding scale  30 mL  3  . metFORMIN (GLUCOPHAGE) 1000 MG tablet TAKE 1 TABLET BY MOUTH TWICE DAILY  60 tablet  2    Allergies: Review of patient's allergies indicates no active allergies. Past Medical History  Diagnosis Date  . Anemia     iron deficiency  . Anxiety   . Depression   . Diabetes mellitus     type 2  . Hyperlipidemia   . Hypertension   . GERD (gastroesophageal reflux disease)   . Headache   . Peptic ulcer disease   . History of colonic polyps    Past Surgical History  Procedure Date  . Tubal ligation    Family History  Problem Relation Age of Onset  . Alcohol abuse Other   . Drug abuse Other   . Arthritis Other   . Diabetes Other   . Depression Other   . Hypertension Other   . Kidney disease Other   . Cancer Other  Breast,Colon,Lung, and Prostate Cancer  . Stomach cancer Father 104  . Heart attack Brother 51  . Diabetes type II      brother, both parents   History   Social History  . Marital Status: Divorced    Spouse Name: N/A    Number of Children: N/A  . Years of Education: N/A   Occupational History  . Not on file.   Social History Main Topics  . Smoking status: Current Everyday Smoker -- 0.5 packs/day for 25 years    Types: Cigarettes  . Smokeless tobacco: Never Used  . Alcohol Use: No  . Drug Use: No  . Sexually Active: No   Other Topics Concern  . Not on file   Social History Narrative   Patient is divorced and  lives alone. She is on disability and has 3 daughter who she does not speak to often. She does not have any good friends and mostly stays at home. She has had multiple psychiatric admissions for depression and suicidality.   Physical Exam: Blood pressure 123/86, pulse 123, temperature 98.1 F (36.7 C), temperature source Oral, resp. rate 18, height 5\' 7"  (1.702 m), weight 260 lb 2.3 oz (118 kg), last menstrual period 09/29/2011, SpO2 94.00%. General: obese woman resting in bed, appears depressed but in no apparent distress HEENT: PERRL, EOMI, no scleral icterus Cardiac: RRR, no rubs, murmurs or gallops Pulm: clear to auscultation bilaterally, moving normal volumes of air Abd: soft, tender to palpation in episgatrium, nondistended, BS present Ext: warm and well perfused, no pedal edema Neuro: alert and oriented X3, cranial nerves II-XII grossly intact  Lab results: Basic Metabolic Panel:  Basename 10/08/11 0624  NA 131*  K 4.5  CL 93*  CO2 21  GLUCOSE 446*  BUN 9  CREATININE 0.65  CALCIUM 11.7*  MG --  PHOS --   Liver Function Tests:  [facility] 10/08/11 0624  AST 13  ALT 12  ALKPHOS 122*  BILITOT 0.3  PROT 7.7  ALBUMIN 3.6    Basename 10/08/11 0624  LIPASE 15  AMYLASE --   CBC:  Basename 10/08/11 0624  WBC 10.0  NEUTROABS 6.1  HGB 15.0  HCT 43.1  MCV 83.0  PLT 339   Cardiac Enzymes:  Basename 10/08/11 1652  CKTOTAL 27  CKMB 2.1  CKMBINDEX --  TROPONINI <0.30    Ref. Range 10/08/2011 07:46  Color, Urine Latest Range: YELLOW  YELLOW  APPearance Latest Range: CLEAR  CLEAR  Specific Gravity, Urine Latest Range: 1.005-1.030  1.038 (H)  pH Latest Range: 5.0-8.0  5.5  Glucose, UA Latest Range: NEGATIVE mg/dL >1610 (A)  Bilirubin Urine Latest Range: NEGATIVE  MODERATE (A)  Ketones, ur Latest Range: NEGATIVE mg/dL >96 (A)  Protein Latest Range: NEGATIVE mg/dL NEGATIVE  Urobilinogen, UA Latest Range: 0.0-1.0 mg/dL 0.2  Nitrite Latest Range: NEGATIVE  NEGATIVE    Leukocytes, UA Latest Range: NEGATIVE  NEGATIVE  WBC, UA Latest Range: <3 WBC/hpf 0-2  RBC / HPF Latest Range: <3 RBC/hpf 3-6  Squamous Epithelial / LPF Latest Range: RARE  MANY (A)  Bacteria, UA Latest Range: RARE  FEW (A)   D-Dimer:  Basename 10/08/11 0910  DDIMER 0.67*   1) Epigastric pain: Patient with epigastric pain with nausea and vomiting- with unclear etiology. lipase normal, ultrasound abdomen- no acute pathology, CT angiogram chest (done for positive d-dimer) negative- for PE or any acute pathology.  Also EKG showing no signs of ACS and troponin negative in ER (as patient has uncontrolled  DM- 2 rule out ACS) Likely from GERD and history of PUD, and patient taking high doses of ibuprofen for pain.   Plan: Admit to telemetry bed - Anti-emetics- Zofran - Advance diet as tolerated - Tylenol for mild pain - Cardiac enzymes x3  2) Syncope: Patient had multiple previous episodes of syncope- which were similar to the current episode. Likely From severe anxiety/panic attacks vs. orthostatic hypotension vs. neurogenic syncope vs. side effect of psych medications.  - Had carotid duplex done in 2011- no stenosis bilaterally.  Plan: Orthostatic vitals. - Normal saline- 150 cc an hour for 20 hours. - No further imaging at this point of time.  3) Uncontrolled DM 2: Last HbA1c 11.3 in October 2012.  Plan: Recheck A1c - Lantus 10 units each bedtime - SSI moderate with bed time coverage.  4) Bipolar disorder: Patient with history of suicide attempt in October 2012- with inpatient behavioral health admission. Major changes in medications made her for that episode.  Plan: Continue Seroquel and Cymbalta at home dose. - Outpatient psych followup.  5) DVT prophylaxis: Lovenox.  CBG:  Basename 10/08/11 1659 10/08/11 1056 10/08/11 0825 10/08/11 0637  GLUCAP 310* 199* 322* 396*   Urine Drug Screen: Drugs of Abuse     Component Value Date/Time   LABOPIA NONE DETECTED  07/26/2011 0626   COCAINSCRNUR NONE DETECTED 07/26/2011 0626   LABBENZ NONE DETECTED 07/26/2011 0626   AMPHETMU NONE DETECTED 07/26/2011 0626   THCU NONE DETECTED 07/26/2011 0626   LABBARB NONE DETECTED 07/26/2011 0626     Imaging results:  Dg Chest 2 View  10/08/2011  *RADIOLOGY REPORT*  Clinical Data: Epigastric/abdominal pain.  Shortness of breath. Hypertension.  Nausea.  CHEST - 2 VIEW  Comparison: 07/10/2011  Findings: Midline trachea.  Normal heart size and mediastinal contours. No pleural effusion or pneumothorax.  Clear lungs.  IMPRESSION: Normal chest.  Original Report Authenticated By: Consuello Bossier, M.D.   Ct Angio Chest W/cm &/or Wo Cm  10/08/2011  *RADIOLOGY REPORT*  Clinical Data:  Syncope.  Chest pain.  Elevated D-dimer.  CT ANGIOGRAPHY CHEST WITH CONTRAST  Technique:  Multidetector CT imaging of the chest was performed using the standard protocol during bolus administration of intravenous contrast.  Multiplanar CT image reconstructions including MIPs were obtained to evaluate the vascular anatomy.  Contrast:  86 ml Omnipaque-300.  Comparison:  None  Findings:  The chest wall is unremarkable.  No obvious breast masses, supraclavicular or axillary lymphadenopathy.  Nodularity the thyroid gland likely benign goiter.  The bony thorax is intact.  The heart is normal in size.  No pericardial effusion.  No mediastinal or hilar lymphadenopathy.  The esophagus is unremarkable.  The aorta is normal in caliber.  No dissection.  The pulmonary arterial tree is well opacified.  No filling defects to suggest pulmonary emboli.  Examination of the lung parenchyma demonstrates no acute pulmonary findings.  No pleural effusion.  No worrisome pulmonary nodules or mass lesions.  The upper abdomen demonstrates fatty infiltration of the liver.  No upper abdominal mass lesions.  Review of the MIP images confirms the above findings.  IMPRESSION:  1.  No CT findings for pulmonary embolism. 2.  Normal thoracic  aorta. 3.  No acute pulmonary findings.  Original Report Authenticated By: P. Loralie Champagne, M.D.   US Abdomen Complete  10/08/2011  *RADIOLOGY REPORT*  Clinical Data:  Epigastric abdominal pain.  COMPLETE ABDOMINAL ULTRASOUND  Comparison:  None  Findings:  Gallbladder:  No gallstones, gallbladder wall  thickening, or pericholecystic fluid.  Common bile duct:  Normal in caliber measuring a maximum of 5.26mm.  Liver:  There is diffuse increased echogenicity of the liver and decreased through transmission consistent with fatty infiltration. No focal lesions or biliary dilatation.  IVC:  Normal caliber.  Pancreas:  Not visualized due to overlying bowel gas.  Spleen:  Normal size and echogenicity without focal lesions.  Right Kidney:  13.2 cm in length. Normal renal cortical thickness and echogenicity without focal lesions or hydronephrosis.  Left Kidney:  12.9 cm in length. Normal renal cortical thickness and echogenicity without focal lesions or hydronephrosis.  Abdominal aorta:  Normal caliber  IMPRESSION:  1.  Normal gallbladder and normal caliber common bile duct. 2.  Diffuse fatty infiltration of the liver. 3.  Non-visualization of the pancreas. 4.  Normal sonographic appearance of the spleen and both kidneys.  Original Report Authenticated By: P. Loralie Champagne, M.D.    Other results: EKG: normal sinus rhythm, non-specific T-wave abbnormality  Assessment & Plan by Problem:  1) Epigastric pain: Patient with epigastric pain with nausea and vomiting- with unclear etiology. lipase normal, ultrasound abdomen- no acute pathology, CT angiogram chest (done for positive d-dimer) negative- for PE or any acute pathology.  Also EKG showing no signs of ACS and troponin negative in ER (as patient has uncontrolled DM- 2 rule out ACS) Likely from GERD and history of PUD, and patient taking high doses of ibuprofen for pain.   Plan: Admit to telemetry bed - Anti-emetics- Zofran - Advance diet as tolerated -  Tylenol for mild pain, toradol for moderate pain - Cardiac enzymes x3  2) Syncope: Patient had multiple previous episodes of syncope- which were similar to the current episode. Likely From severe anxiety/panic attacks vs. orthostatic hypotension vs. neurogenic syncope vs. side effect of psych medications.  - Had carotid duplex done in 2011- no stenosis bilaterally.  Plan: Orthostatic vitals. - Normal saline- 150 cc an hour for 20 hours. - No further imaging at this point of time.  3) Uncontrolled DM 2: Last HbA1c 11.3 in October 2012.  Plan: Recheck A1c - Lantus 10 units each bedtime - SSI moderate with bed time coverage.  4) Bipolar disorder: Patient with history of suicide attempt in October 2012- with inpatient behavioral health admission. Major changes in medications made her for that episode.  Plan: Continue Seroquel and Cymbalta at home dose. - Outpatient psych followup.  5) DVT prophylaxis: Lovenox.   SignedMargorie John 10/08/2011, 6:42 PM

## 2011-10-08 NOTE — ED Notes (Signed)
Received pt. From triage via w/c, pt. Alert and oriented, ambulated to stretcher, NAD noted

## 2011-10-08 NOTE — ED Notes (Signed)
Report called to [place], RN on [id number]. Patient being transferred to [id number].

## 2011-10-09 LAB — BASIC METABOLIC PANEL
GFR calc non Af Amer: >90 mL/min (ref >90)
Glucose, Bld: 296 mg/dL — ABNORMAL HIGH (ref 70–99)
Potassium: 4.6 mEq/L (ref 3.5–5.1)
Sodium: 131 mEq/L — ABNORMAL LOW (ref 135–145)

## 2011-10-09 LAB — CBC
Hemoglobin: 13.2 g/dL (ref 12.0–15.0)
MCH: 28.9 pg (ref 26.0–34.0)
RBC: 4.57 MIL/uL (ref 3.87–5.11)
WBC: 8.9 10*3/uL (ref 4.0–10.5)

## 2011-10-09 LAB — GLUCOSE, CAPILLARY
Glucose-Capillary: 283 mg/dL — ABNORMAL HIGH (ref 70–99)
Glucose-Capillary: 334 mg/dL — ABNORMAL HIGH (ref 70–99)
Glucose-Capillary: 403 mg/dL — ABNORMAL HIGH (ref 70–99)
Glucose-Capillary: 259 mg/dL — ABNORMAL HIGH (ref 70–99)

## 2011-10-09 LAB — CARDIAC PANEL(CRET KIN+CKTOT+MB+TROPI): Relative Index: INVALID (ref 0.0–2.5)

## 2011-10-09 MED ORDER — INSULIN GLARGINE 100 UNIT/ML ~~LOC~~ SOLN
20.0000 [IU] | Freq: Every day | SUBCUTANEOUS | Status: DC
  Administered 2011-10-09 – 2011-10-10 (×2): 20 [IU] via SUBCUTANEOUS

## 2011-10-09 NOTE — Progress Notes (Signed)
Subjective: Patient feels little better. Still nauseous, but no episodes of vomiting since admission. Able to keep down breakfast and lunch- without any problem. Pain does not get worse with eating.  Objective: Vital signs in last 24 hours: Filed Vitals:   10/08/11 1632 10/08/11 2100 10/09/11 0500 10/09/11 1400  BP: 123/86 105/70 119/77 155/86  Pulse: 123 78 86 98  Temp:  98.1 F (36.7 C) 98.3 F (36.8 C) 98.7 F (37.1 C)  TempSrc:  Oral Oral Oral  Resp:  18 18 20   Height:      Weight:      SpO2:  97% 93% 98%   Weight change:   Intake/Output Summary (Last 24 hours) at 10/09/11 1754 Last data filed at 10/09/11 1200  Gross per 24 hour  Intake   1200 ml  Output      0 ml  Net   1200 ml   Physical Exam: General: resting in bed HEENT: PERRL, EOMI, no scleral icterus Cardiac: S1, S2, RRR, no rubs, murmurs or gallops Pulm: clear to auscultation bilaterally, moving normal volumes of air Abd: soft, mildly tender to palpation in epigastric region, nondistended, BS present Ext: warm and well perfused, no pedal edema Neuro: alert and oriented X3, cranial nerves II-XII grossly intact  Lab Results: Basic Metabolic Panel:  Lab 10/09/11 4098 10/08/11 0624  NA 131* 131*  K 4.6 4.5  CL 100 93*  CO2 21 21  GLUCOSE 296* 446*  BUN 10 9  CREATININE 0.63 0.65  CALCIUM 8.8 11.7*  MG -- --  PHOS -- --   Liver Function Tests:  Lab 10/08/11 0624  AST 13  ALT 12  ALKPHOS 122*  BILITOT 0.3  PROT 7.7  ALBUMIN 3.6    Lab 10/08/11 0624  LIPASE 15  AMYLASE --   No results found for this basename: AMMONIA:2 in the last 168 hours CBC:  Lab 10/09/11 0520 10/08/11 0624  WBC 8.9 10.0  NEUTROABS -- 6.1  HGB 13.2 15.0  HCT 38.6 43.1  MCV 84.5 83.0  PLT 275 339   Cardiac Enzymes:  Lab 10/08/11 2213 10/08/11 1652  CKTOTAL 22 27  CKMB 1.9 2.1  CKMBINDEX -- --  TROPONINI <0.30 <0.30   BNP: No results found for this basename: PROBNP:3 in the last 168 hours D-Dimer:  Lab  10/08/11 0910  DDIMER 0.67*   CBG:  Lab 10/09/11 1630 10/09/11 1118 10/09/11 0720 10/08/11 2159 10/08/11 1659 10/08/11 1056  GLUCAP 403* 334* 283* 416* 310* 199*   Hemoglobin A1C:  Lab 10/08/11 1652  HGBA1C 12.6*   Fasting Lipid Panel: No results found for this basename: CHOL,HDL,LDLCALC,TRIG,CHOLHDL,LDLDIRECT in the last 119 hours Thyroid Function Tests: No results found for this basename: TSH,T4TOTAL,FREET4,T3FREE,THYROIDAB in the last 168 hours Coagulation: No results found for this basename: LABPROT:4,INR:4 in the last 168 hours Anemia Panel: No results found for this basename: VITAMINB12,FOLATE,FERRITIN,TIBC,IRON,RETICCTPCT in the last 168 hours Urine Drug Screen: Drugs of Abuse     Component Value Date/Time   LABOPIA NONE DETECTED 07/26/2011 0626   COCAINSCRNUR NONE DETECTED 07/26/2011 0626   LABBENZ NONE DETECTED 07/26/2011 0626   AMPHETMU NONE DETECTED 07/26/2011 0626   THCU NONE DETECTED 07/26/2011 0626   LABBARB NONE DETECTED 07/26/2011 0626    Alcohol Level: No results found for this basename: ETH:2 in the last 168 hours   Micro Results: No results found for this or any previous visit (from the past 240 hour(s)). Studies/Results: Dg Chest 2 View  10/08/2011  *RADIOLOGY REPORT*  Clinical  Data: Epigastric/abdominal pain.  Shortness of breath. Hypertension.  Nausea.  CHEST - 2 VIEW  Comparison: 07/10/2011  Findings: Midline trachea.  Normal heart size and mediastinal contours. No pleural effusion or pneumothorax.  Clear lungs.  IMPRESSION: Normal chest.  Original Report Authenticated By: Consuello Bossier, M.D.   Ct Angio Chest W/cm &/or Wo Cm  10/08/2011  *RADIOLOGY REPORT*  Clinical Data:  Syncope.  Chest pain.  Elevated D-dimer.  CT ANGIOGRAPHY CHEST WITH CONTRAST  Technique:  Multidetector CT imaging of the chest was performed using the standard protocol during bolus administration of intravenous contrast.  Multiplanar CT image reconstructions including MIPs were  obtained to evaluate the vascular anatomy.  Contrast:  86 ml Omnipaque-300.  Comparison:  None  Findings:  The chest wall is unremarkable.  No obvious breast masses, supraclavicular or axillary lymphadenopathy.  Nodularity the thyroid gland likely benign goiter.  The bony thorax is intact.  The heart is normal in size.  No pericardial effusion.  No mediastinal or hilar lymphadenopathy.  The esophagus is unremarkable.  The aorta is normal in caliber.  No dissection.  The pulmonary arterial tree is well opacified.  No filling defects to suggest pulmonary emboli.  Examination of the lung parenchyma demonstrates no acute pulmonary findings.  No pleural effusion.  No worrisome pulmonary nodules or mass lesions.  The upper abdomen demonstrates fatty infiltration of the liver.  No upper abdominal mass lesions.  Review of the MIP images confirms the above findings.  IMPRESSION:  1.  No CT findings for pulmonary embolism. 2.  Normal thoracic aorta. 3.  No acute pulmonary findings.  Original Report Authenticated By: P. Loralie Champagne, M.D.   US Abdomen Complete  10/08/2011  *RADIOLOGY REPORT*  Clinical Data:  Epigastric abdominal pain.  COMPLETE ABDOMINAL ULTRASOUND  Comparison:  None  Findings:  Gallbladder:  No gallstones, gallbladder wall thickening, or pericholecystic fluid.  Common bile duct:  Normal in caliber measuring a maximum of 5.53mm.  Liver:  There is diffuse increased echogenicity of the liver and decreased through transmission consistent with fatty infiltration. No focal lesions or biliary dilatation.  IVC:  Normal caliber.  Pancreas:  Not visualized due to overlying bowel gas.  Spleen:  Normal size and echogenicity without focal lesions.  Right Kidney:  13.2 cm in length. Normal renal cortical thickness and echogenicity without focal lesions or hydronephrosis.  Left Kidney:  12.9 cm in length. Normal renal cortical thickness and echogenicity without focal lesions or hydronephrosis.  Abdominal aorta:  Normal  caliber  IMPRESSION:  1.  Normal gallbladder and normal caliber common bile duct. 2.  Diffuse fatty infiltration of the liver. 3.  Non-visualization of the pancreas. 4.  Normal sonographic appearance of the spleen and both kidneys.  Original Report Authenticated By: P. Loralie Champagne, M.D.   Medications: I have reviewed the patient's current medications. Scheduled Meds:   . DULoxetine  60 mg Oral Daily  . enoxaparin  40 mg Subcutaneous QHS  . gabapentin  600 mg Oral BID  . gi cocktail  30 mL Oral Once  . insulin aspart  0-15 Units Subcutaneous TID WC  . insulin aspart  0-5 Units Subcutaneous QHS  . insulin glargine  20 Units Subcutaneous QHS  . metFORMIN  1,000 mg Oral BID WC  . pantoprazole  40 mg Oral Q1200  . QUEtiapine  25 mg Oral Custom  . QUEtiapine  300 mg Oral QHS  . DISCONTD: insulin glargine  10 Units Subcutaneous QHS   Continuous Infusions:   .  sodium chloride 100 mL/hr at 10/09/11 0805   PRN Meds:.acetaminophen, acetaminophen, ketorolac, ondansetron (ZOFRAN) IV, ondansetron Assessment/Plan:  1) Epigastric pain, nausea and vomiting:  Unknown etiology at present- likely from chronic NSAID ingestion- the patient with history of PUD and GERD. The patient getting better slowly.  Plan: Consult GI tomorrow a.m. for possible endoscopy if patient still having pain and not tolerating by mouth  2) Depression: Continue Seroquel and Cymbalta.  3) Uncontrolled DM 2: Increased Lantus to 20 units daily. Continue sliding scale insulin.  4) Dispo: Home once is stable- likely tomorrow.  5) DVT prophylaxis: Lovenox  LOS: 1 day   [name] 10/09/2011, 5:54 PM

## 2011-10-09 NOTE — Progress Notes (Signed)
Notified Dr. Maisie Fus that pts cbg 403, orders received. Pt educated on diet restrictions. She had a cheeseburger, frosty and fries brought in by her daughter this afternoon after eating our lunch provided. Will cont to monitor and educate

## 2011-10-09 NOTE — H&P (Signed)
51 woman with chronic mental illness, severe diabetes and very poor adherence.  Admitted now for epigastric pain and an episode of syncope.  A1c = 12.6.  Mild epigastric tenderness w/o guarding.  She has a hx of "ulcer" but never had EGD.  Has been on no antacid rx but has been on large doses of advil every day.  Also smokes and drinks 4 litres of coca-cola a day.  Quite obese but has lost from 280 to 260 over several months. The abd pain needs a change in oral intake from advil to PPI.  She should have an EGD if this can be arranged.  U/S shows fatty liver: obesity and DM.  LFTs almost normal. Syncope may be related to wgt loss, BG of 440 with increased urination (ictus was pre-voiding), and neuropathy. She has been only taking one dose a day of short-acting insulin.  It should not be hard to solve each of these problems if she can comply with some simple advice.

## 2011-10-09 NOTE — Progress Notes (Addendum)
Inpatient Diabetes Program Recommendations  AACE/ADA: New Consensus Statement on Inpatient Glycemic Control (2009)  Target Ranges:  Prepandial:   less than 140 mg/dL      Peak postprandial:   less than 180 mg/dL (1-2 hours)      Critically ill patients:  140 - 180 mg/dL   Reason for Visit: CBG's greater than goal. A1C=12.6% indicating poor glycemic control prior to admit.  Inpatient Diabetes Program Recommendations Insulin - Basal: Increase Lantus to 20 units daily. Insulin - Meal Coverage: Add Novolog meal coverage 6 units tid with meals.  Note: Will nee follow-up with PCP and adjustment of home diabetes medications at discharge.  Addendum:  Spoke to patient regarding diabetes/control of blood glucoses.  She states that she was being seen by Dr. Yetta Barre at [place] however recently stopped seeing him due to cost.  She had been on Lantus 200 units daily and Novolog with meals.  She stopped taking the Lantus about 1 month ago.  [facility] due to cost but states she is not well established yet.  She has meter but states that it has stopped working.  When asked if it needed new batteries, she said that she would check. She has plenty of strips.  If battery change does not help, encouraged her to call manufacturer.  She admits that she has been depressed and states that she has not been taking care of self b/c she does not feel like it.  States her anxiety meds were changed in October 2012 and things have been "worse".  Has never had any education regarding diabetes.  Would benefit from Outpatient diabetes education follow-up. Also needs PCP for close follow-up.  Will patient be following up with [facility] internal medicine?  If so would benefit with f/u with CDE- Ned Clines.  She admits that cost and finances often prohibit her from buying medications, seeing MD and eating healthy foods.

## 2011-10-09 NOTE — Clinical Documentation Improvement (Signed)
Abnormal Labs Clarification  THIS DOCUMENT IS NOT A PERMANENT PART OF THE MEDICAL RECORD  TO RESPOND TO THE THIS QUERY, FOLLOW THE INSTRUCTIONS BELOW:  1. If needed, update documentation for the patient's encounter via the notes activity.  2. Access this query again and click edit on the [employer].  3. After updating, or not, click F2 to complete all highlighted (required) fields concerning your review. Select "additional documentation in the medical record" OR "no additional documentation provided".  4. Click Sign note button.  5. The deficiency will fall out of your InBasket *Please let us know if you are not able to complete this workflow by phone or e-mail (listed below).  Please update your documentation within the medical record to reflect your response to this query.                                                                                   10/09/11  Dear Dr. Maisie Fus Marton Redwood  In a better effort to capture your patient's severity of illness, reflect appropriate length of stay and utilization of resources, a review of the medical record has revealed the following indicators.  PLEASE ADDRESS ABNORMAL LAB VALUE AND TREATMENT.  THANK YOU.  Based on your clinical judgment, please clarify and document in a progress note and/or discharge summary the clinical condition associated with the following supporting information:  Abnormal findings (laboratory, x-ray, pathologic, and other diagnostic results) are not coded and reported unless the physician indicates their clinical significance.   The medical record reflects the following clinical findings, please clarify the diagnostic and/or clinical significance:      Possible Clinical Conditions?                                 Supporting Information:  Hyponatremia                                                         Patient Values: Na+ 131 Other Condition                                                      Treatment:   NS  IV 150ml/hr x20hrs Cannot Clinically Determine                                           Reviewed:  no additional documentation provided  Thank You,  Beverley Fiedler RN Clinical Documentation Specialist: Cell:  [phone]  Health Information Management [place]

## 2011-10-10 DIAGNOSIS — E119 Type 2 diabetes mellitus without complications: Secondary | ICD-10-CM

## 2011-10-10 DIAGNOSIS — R1013 Epigastric pain: Secondary | ICD-10-CM

## 2011-10-10 DIAGNOSIS — K219 Gastro-esophageal reflux disease without esophagitis: Secondary | ICD-10-CM

## 2011-10-10 LAB — GLUCOSE, CAPILLARY: Glucose-Capillary: 279 mg/dL — ABNORMAL HIGH (ref 70–99)

## 2011-10-10 MED ORDER — TRAMADOL HCL 50 MG PO TABS
50.0000 mg | ORAL_TABLET | Freq: Four times a day (QID) | ORAL | Status: DC | PRN
  Administered 2011-10-10 (×2): 50 mg via ORAL
  Filled 2011-10-10 (×2): qty 1

## 2011-10-10 MED ORDER — INSULIN ASPART 100 UNIT/ML ~~LOC~~ SOLN: 0.0000 [IU] | Freq: Every day | SUBCUTANEOUS | Status: DC

## 2011-10-10 MED ORDER — INSULIN ASPART 100 UNIT/ML ~~LOC~~ SOLN: 0.0000 [IU] | Freq: Three times a day (TID) | SUBCUTANEOUS | Status: DC

## 2011-10-10 MED ORDER — INSULIN ASPART 100 UNIT/ML ~~LOC~~ SOLN
0.0000 [IU] | Freq: Three times a day (TID) | SUBCUTANEOUS | Status: DC
  Administered 2011-10-10: 8 [IU] via SUBCUTANEOUS
  Administered 2011-10-11: 11 [IU] via SUBCUTANEOUS

## 2011-10-10 MED ORDER — INSULIN ASPART 100 UNIT/ML ~~LOC~~ SOLN
6.0000 [IU] | Freq: Three times a day (TID) | SUBCUTANEOUS | Status: DC
  Administered 2011-10-10 – 2011-10-11 (×2): 6 [IU] via SUBCUTANEOUS

## 2011-10-10 MED ORDER — KETOROLAC TROMETHAMINE 30 MG/ML IJ SOLN: 30.0000 mg | Freq: Four times a day (QID) | INTRAMUSCULAR | Status: DC | PRN

## 2011-10-10 MED ORDER — PANTOPRAZOLE SODIUM 40 MG PO TBEC
40.0000 mg | DELAYED_RELEASE_TABLET | Freq: Two times a day (BID) | ORAL | Status: DC
  Administered 2011-10-10 – 2011-10-11 (×2): 40 mg via ORAL
  Filled 2011-10-10 (×2): qty 1

## 2011-10-10 MED ORDER — INSULIN ASPART 100 UNIT/ML ~~LOC~~ SOLN: 6.0000 [IU] | Freq: Three times a day (TID) | SUBCUTANEOUS | Status: DC

## 2011-10-10 MED ORDER — INSULIN ASPART 100 UNIT/ML ~~LOC~~ SOLN
0.0000 [IU] | Freq: Every day | SUBCUTANEOUS | Status: DC
  Administered 2011-10-10: 2 [IU] via SUBCUTANEOUS

## 2011-10-10 NOTE — Progress Notes (Addendum)
Subjective: Patient feels little better. Still nauseous and has mild to moderate abdominal pain, but no episodes of vomiting since admission. Able to keep down breakfast and lunch- without any problem. Pain does not get worse with eating.  Objective: Vital signs in last 24 hours: Filed Vitals:   10/09/11 0500 10/09/11 1400 10/09/11 2100 10/10/11 0500  BP: 119/77 155/86 118/77 106/70  Pulse: 86 98 87 76  Temp: 98.3 F (36.8 C) 98.7 F (37.1 C) 98.1 F (36.7 C) 98 F (36.7 C)  TempSrc: Oral Oral Oral Oral  Resp: 18 20 20 20   Height:      Weight:      SpO2: 93% 98% 96% 94%   Weight change:   Intake/Output Summary (Last 24 hours) at 10/10/11 1140 Last data filed at 10/09/11 2300  Gross per 24 hour  Intake    600 ml  Output    400 ml  Net    200 ml   Physical Exam: General: resting in bed HEENT: PERRL, EOMI, no scleral icterus Cardiac: S1, S2, RRR, no rubs, murmurs or gallops Pulm: clear to auscultation bilaterally, moving normal volumes of air Abd: soft, mildly tender to palpation in epigastric region, nondistended, BS present Ext: warm and well perfused, no pedal edema Neuro: alert and oriented X3, cranial nerves II-XII grossly intact  Lab Results: Basic Metabolic Panel:  Lab 10/09/11 3086 10/08/11 0624  NA 131* 131*  K 4.6 4.5  CL 100 93*  CO2 21 21  GLUCOSE 296* 446*  BUN 10 9  CREATININE 0.63 0.65  CALCIUM 8.8 11.7*  MG -- --  PHOS -- --   Liver Function Tests:  Lab 10/08/11 0624  AST 13  ALT 12  ALKPHOS 122*  BILITOT 0.3  PROT 7.7  ALBUMIN 3.6    Lab 10/08/11 0624  LIPASE 15  AMYLASE --   No results found for this basename: AMMONIA:2 in the last 168 hours CBC:  Lab 10/09/11 0520 10/08/11 0624  WBC 8.9 10.0  NEUTROABS -- 6.1  HGB 13.2 15.0  HCT 38.6 43.1  MCV 84.5 83.0  PLT 275 339   Cardiac Enzymes:  Lab 10/08/11 2213 10/08/11 1652  CKTOTAL 22 27  CKMB 1.9 2.1  CKMBINDEX -- --  TROPONINI <0.30 <0.30   BNP: No results found for  this basename: PROBNP:3 in the last 168 hours D-Dimer:  Lab 10/08/11 0910  DDIMER 0.67*   CBG:  Lab 10/10/11 0747 10/09/11 2108 10/09/11 1630 10/09/11 1118 10/09/11 0720 10/08/11 2159  GLUCAP 284* 259* 403* 334* 283* 416*   Hemoglobin A1C:  Lab 10/08/11 1652  HGBA1C 12.6*    Urine Drug Screen: Drugs of Abuse     Component Value Date/Time   LABOPIA NONE DETECTED 07/26/2011 0626   COCAINSCRNUR NONE DETECTED 07/26/2011 0626   LABBENZ NONE DETECTED 07/26/2011 0626   AMPHETMU NONE DETECTED 07/26/2011 0626   THCU NONE DETECTED 07/26/2011 0626   LABBARB NONE DETECTED 07/26/2011 0626     Medications: I have reviewed the patient's current medications. Scheduled Meds:    . DULoxetine  60 mg Oral Daily  . enoxaparin  40 mg Subcutaneous QHS  . gabapentin  600 mg Oral BID  . insulin aspart  0-15 Units Subcutaneous TID WC  . insulin aspart  0-5 Units Subcutaneous QHS  . insulin glargine  20 Units Subcutaneous QHS  . metFORMIN  1,000 mg Oral BID WC  . pantoprazole  40 mg Oral Q1200  . QUEtiapine  25 mg Oral Custom  .  QUEtiapine  300 mg Oral QHS  . DISCONTD: insulin glargine  10 Units Subcutaneous QHS   Continuous Infusions:    . sodium chloride 100 mL/hr at 10/09/11 0805   PRN Meds:.acetaminophen, acetaminophen, ondansetron (ZOFRAN) IV, ondansetron, traMADol, DISCONTD: ketorolac, DISCONTD: ketorolac Assessment/Plan:  1) Epigastric pain, nausea and vomiting:  Unknown etiology at present- likely from chronic NSAID ingestion- the patient with history of PUD and GERD. The patient getting better slowly.  Plan: Dr. Rhea Belton - with [facility]- evaluated patient- consult highly appreciated. - Patient going to have upper endoscopy tomorrow - 10/11/2011. - Changed Protonix to twice a day - Stop NSAIDs- started tramadol for pain  2) Depression: Continue Seroquel and Cymbalta.  3) Uncontrolled DM 2: Increased Lantus to 20 units daily. Continue sliding scale insulin. - Will add  mealtime coverage.  4) Dispo: Home once is stable- after endoscopy.  5) DVT prophylaxis: Lovenox  LOS: 2 days   Lesieli Bresee 10/10/2011, 11:40 AM

## 2011-10-10 NOTE — Consult Note (Signed)
Referring Provider: [facility] hospitalist Primary Care Physician:  Sanda Linger, MD, MD Primary Gastroenterologist; unassigned  Reason for Consultation:  Epigastric pain and nausea  HPI: [name] is a 51 y.o. female seen in consultation today by request of the teaching service. She has history of insulin-dependent diabetes, hyperlipidemia, obesity, anxiety, and significant depression. She gives history of chronic GERD and remote peptic ulcer disease. Patient states that she takes proton exit home fairly regularly. She says that over the past couple of months she has had problems with epigastric pain and anorexia. She has had a 30+ pound weight loss since October of 2012. She says initially she stopped eating very much because it hurt after she eats and then eventually she got used eating less and is now does not is hungry. She describes her pain as sharp burning and "like heartburn on steroids". She denies any odynophagia ,has had some mild dysphagia. She has not noticed any melena or hematochezia. She has had intermittent problems with nausea and some vomiting. She had a syncopal episode prior to admission. On further questioning she has been taking at least 6-8 ibuprofen daily over the past many months for chronic problems with headaches jaw pain and dental pain.  Upper abdominal ultrasound on admission was unremarkable. She also had CT scan of the chest with angio which was negative. Labs on admission showed a normal CBC, LFTs also unremarkable with the exception of a minimally elevated alkaline phosphatase.   Past Medical History  Diagnosis Date  . Anemia     iron deficiency  . Anxiety   . Depression   . Diabetes mellitus     type 2  . Hyperlipidemia   . Hypertension   . GERD (gastroesophageal reflux disease)   . Headache   . Peptic ulcer disease   . History of colonic polyps     Past Surgical History  Procedure Date  . Tubal ligation     Prior to Admission medications     Medication Sig Start Date End Date Taking? Authorizing Provider  DULoxetine (CYMBALTA) 60 MG capsule Take 60 mg by mouth daily.    Yes Historical Provider, MD  gabapentin (NEURONTIN) 300 MG capsule Take 600 mg by mouth 2 (two) times daily.     Yes Historical Provider, MD  insulin aspart (NOVOLOG FLEXPEN) 100 UNIT/ML injection Inject 10-25 Units into the skin 3 (three) times daily before meals. As directed per sliding scale 02/10/11 02/10/12 Yes Etta Grandchild, MD  metFORMIN (GLUCOPHAGE) 1000 MG tablet TAKE 1 TABLET BY MOUTH TWICE DAILY 10/07/11  Yes Etta Grandchild, MD  pantoprazole (PROTONIX) 40 MG tablet Take 40 mg by mouth daily.     Yes Historical Provider, MD  QUEtiapine (SEROQUEL) 25 MG tablet Take 25 mg by mouth 2 (two) times daily.     Yes Historical Provider, MD  QUEtiapine (SEROQUEL) 300 MG tablet Take 300 mg by mouth at bedtime.     Yes Historical Provider, MD    Current Facility-Administered Medications  Medication Dose Route Frequency Provider Last Rate Last Dose  . 0.9 %  sodium chloride infusion   Intravenous Continuous Ravi Patel 100 mL/hr at 10/09/11 0805    . acetaminophen (TYLENOL) tablet 650 mg  650 mg Oral Q6H PRN Ravi Patel   650 mg at 10/09/11 2238   Or  . acetaminophen (TYLENOL) suppository 650 mg  650 mg Rectal Q6H PRN Lyn Hollingshead      . DULoxetine (CYMBALTA) DR capsule 60 mg  60 mg  Oral Daily Ravi Patel   60 mg at 10/10/11 1035  . enoxaparin (LOVENOX) injection 40 mg  40 mg Subcutaneous QHS Ravi Patel   40 mg at 10/09/11 2237  . gabapentin (NEURONTIN) capsule 600 mg  600 mg Oral BID Ravi Patel   600 mg at 10/10/11 1034  . insulin aspart (novoLOG) injection 0-15 Units  0-15 Units Subcutaneous TID WC Ravi Patel   8 Units at 10/10/11 0851  . insulin aspart (novoLOG) injection 0-5 Units  0-5 Units Subcutaneous QHS Ravi Patel   3 Units at 10/09/11 2240  . insulin glargine (LANTUS) injection 20 Units  20 Units Subcutaneous QHS Margorie John, MD   20 Units at 10/09/11 2241  .  metFORMIN (GLUCOPHAGE) tablet 1,000 mg  1,000 mg Oral BID WC Ravi Patel   1,000 mg at 10/10/11 0851  . ondansetron (ZOFRAN) tablet 4 mg  4 mg Oral Q6H PRN Ravi Patel   4 mg at 10/09/11 2239   Or  . ondansetron (ZOFRAN) injection 4 mg  4 mg Intravenous Q6H PRN Ravi Patel   4 mg at 10/08/11 2314  . pantoprazole (PROTONIX) EC tablet 40 mg  40 mg Oral Q1200 Ravi Patel   40 mg at 10/09/11 1153  . QUEtiapine (SEROQUEL) tablet 25 mg  25 mg Oral Custom Ravi Patel   25 mg at 10/10/11 0851  . QUEtiapine (SEROQUEL) tablet 300 mg  300 mg Oral QHS Ravi Patel   300 mg at 10/09/11 2239  . traMADol (ULTRAM) tablet 50 mg  50 mg Oral Q6H PRN Lyn Hollingshead      . DISCONTD: insulin glargine (LANTUS) injection 10 Units  10 Units Subcutaneous QHS Ravi Patel   10 Units at 10/08/11 2237  . DISCONTD: ketorolac (TORADOL) 30 MG/ML injection 30 mg  30 mg Intravenous Q8H PRN Margorie John, MD   30 mg at 10/10/11 0912  . DISCONTD: ketorolac (TORADOL) 30 MG/ML injection 30 mg  30 mg Intravenous Q6H PRN Ravi Patel        Allergies as of 10/08/2011  . (No Active Allergies)    Family History  Problem Relation Age of Onset  . Alcohol abuse Other   . Drug abuse Other   . Arthritis Other   . Diabetes Other   . Depression Other   . Hypertension Other   . Kidney disease Other   . Cancer Other     Breast,Colon,Lung, and Prostate Cancer  . Stomach cancer Father 21  . Heart attack Brother 51  . Diabetes type II      brother, both parents    History   Social History  . Marital Status: Divorced    Spouse Name: N/A    Number of Children: N/A  . Years of Education: N/A   Occupational History  . Not on file.   Social History Main Topics  . Smoking status: Current Everyday Smoker -- 0.5 packs/day for 25 years    Types: Cigarettes  . Smokeless tobacco: Never Used  . Alcohol Use: No  . Drug Use: No  . Sexually Active: No   Other Topics Concern  . Not on file   Social History Narrative   Patient is divorced and  lives alone. She is on disability and has 3 daughter who she does not speak to often. She does not have any good friends and mostly stays at home. She has had multiple psychiatric admissions for depression and suicidality.    Review of Systems:Pertinent positive and negative review  of systems were noted in the above HPI section.  All other review of systems was otherwise negative.    Physical Exam: Vital signs in last 24 hours: Temp:  [98 F (36.7 C)-98.7 F (37.1 C)] 98 F (36.7 C) (01/05 0500) Pulse Rate:  [76-98] 76  (01/05 0500) Resp:  [20] 20  (01/05 0500) BP: (106-155)/(70-86) 106/70 mmHg (01/05 0500) SpO2:  [94 %-98 %] 94 % (01/05 0500) Last BM Date: 10/08/11 General:   Alert,  Well-developed, obese, pleasant and cooperative in NAD Head:  Normocephalic and atraumatic. Eyes:  Sclera clear, no icterus.   Conjunctiva pink. Ears:  Normal auditory acuity. Nose:  No deformity, discharge,  or lesions. Mouth: very poor dentition Neck:  Supple; no masses or thyromegaly. Lungs:  Clear throughout to auscultation.   No wheezes, crackles, or rhonchi. Heart:  Regular rate and rhythm; no murmurs, clicks, rubs,  or gallops. Abdomen:  Soft,mildly tender epigastrium, no mass or hsm, no guarding, Bs+ Rectal; not done Msk:  Symmetrical without gross deformities. . Pulses:  Normal pulses noted. Extremities:  Without clubbing or edema. Neurologic:  Alert and  oriented x4;  grossly normal neurologically. Skin:  Intact without significant lesions or rashes.. Psych:  Alert and cooperative. Normal mood and affect.  Intake/Output from previous day: 01/04 0701 - 01/05 0700 In: 960 [P.O.:960] Out: 400 [Urine:400] Intake/Output this shift:    Lab Results:  Basename 10/09/11 0520 10/08/11 0624  WBC 8.9 10.0  HGB 13.2 15.0  HCT 38.6 43.1  PLT 275 339   BMET  Basename 10/09/11 0520 10/08/11 0624  NA 131* 131*  K 4.6 4.5  CL 100 93*  CO2 21 21  GLUCOSE 296* 446*  BUN 10 9  CREATININE  0.63 0.65  CALCIUM 8.8 11.7*   LFT  Basename 10/08/11 0624  PROT 7.7  ALBUMIN 3.6  AST 13  ALT 12  ALKPHOS 122*  BILITOT 0.3  BILIDIR --  IBILI --      Studies/Results: No results found.  Impression: #11 51 year old female with insulin-dependent diabetes mellitus admitted with complaints of epigastric pain and nausea with persistent symptoms over the past one to 2 months and associated weight loss. She has been taking chronic high-dose NSAIDs. Suspect she may have an NSAID-induced ulcer. Also consider component of diabetic gastroparesis. #2 insulin-dependent diabetes mellitus #3 depression  Plan: #1 Continue twice a day Protonix #2 Will schedule for upper endoscopy tomorrow 10/10/2010. #3 stop NSAID use, suspect patient will need an alternative analgesic. Further plans pending findings endoscopy.Mike Gip  10/10/2011, 12:26 PM

## 2011-10-10 NOTE — Progress Notes (Signed)
PHARMACIST - PHYSICIAN COMMUNICATION DR:   Allena Katz CONCERNING:  There is a drug interaction between Cymbalta & Ultram, which may lead to increased Cymbalta levels/serotonin syndrome   RECOMMENDATION: Consider using an alternative medication for Ultram

## 2011-10-10 NOTE — Consult Note (Signed)
Patient seen, examined and I agree with the above documentation, including the assessment and plan. BID PPI EGD in the am, ddx: gastritis, ulcer, gastroparesis, less likely malignancy Avoid NSAIDs

## 2011-10-11 ENCOUNTER — Encounter (HOSPITAL_COMMUNITY)
Admission: EM | Disposition: A | Discharge status: Home / Self Care | Payer: Self-pay | Source: Home / Self Care | Attending: Internal Medicine

## 2011-10-11 ENCOUNTER — Encounter (HOSPITAL_COMMUNITY): Payer: Self-pay

## 2011-10-11 ENCOUNTER — Other Ambulatory Visit: Payer: Self-pay | Admitting: Internal Medicine

## 2011-10-11 HISTORY — PX: ESOPHAGOGASTRODUODENOSCOPY: SHX5428

## 2011-10-11 LAB — GLUCOSE, CAPILLARY
Glucose-Capillary: 230 mg/dL — ABNORMAL HIGH (ref 70–99)
Glucose-Capillary: 215 mg/dL — ABNORMAL HIGH (ref 70–99)

## 2011-10-11 SURGERY — EGD (ESOPHAGOGASTRODUODENOSCOPY)
Anesthesia: Moderate Sedation

## 2011-10-11 MED ORDER — BUTAMBEN-TETRACAINE-BENZOCAINE 2-2-14 % EX AERO
INHALATION_SPRAY | CUTANEOUS | Status: DC | PRN
  Administered 2011-10-11: 2 via TOPICAL

## 2011-10-11 MED ORDER — TRAMADOL HCL 50 MG PO TABS: 50.0000 mg | ORAL_TABLET | Freq: Three times a day (TID) | ORAL | Status: AC | PRN

## 2011-10-11 MED ORDER — ONDANSETRON HCL 4 MG PO TABS: 4.0000 mg | ORAL_TABLET | Freq: Four times a day (QID) | ORAL | Status: AC | PRN

## 2011-10-11 MED ORDER — METFORMIN HCL 1000 MG PO TABS: 1000.0000 mg | ORAL_TABLET | Freq: Two times a day (BID) | ORAL | Status: DC

## 2011-10-11 MED ORDER — INSULIN GLARGINE 100 UNIT/ML ~~LOC~~ SOLN: 50.0000 [IU] | Freq: Every day | SUBCUTANEOUS | Status: DC

## 2011-10-11 MED ORDER — MIDAZOLAM HCL 10 MG/2ML IJ SOLN
INTRAMUSCULAR | Status: AC
  Filled 2011-10-11: qty 2

## 2011-10-11 MED ORDER — OMEPRAZOLE 20 MG PO CPDR: 20.0000 mg | DELAYED_RELEASE_CAPSULE | Freq: Two times a day (BID) | ORAL | Status: DC

## 2011-10-11 MED ORDER — FENTANYL NICU IV SYRINGE 50 MCG/ML
INJECTION | INTRAMUSCULAR | Status: DC | PRN
  Administered 2011-10-11: 12.5 ug via INTRAVENOUS
  Administered 2011-10-11: 25 ug via INTRAVENOUS
  Administered 2011-10-11: 12.5 ug via INTRAVENOUS
  Administered 2011-10-11: 25 ug via INTRAVENOUS

## 2011-10-11 MED ORDER — FENTANYL CITRATE 0.05 MG/ML IJ SOLN
INTRAMUSCULAR | Status: AC
  Filled 2011-10-11: qty 2

## 2011-10-11 MED ORDER — SODIUM CHLORIDE 0.9 % IV SOLN
Freq: Once | INTRAVENOUS | Status: AC
  Administered 2011-10-11: 08:00:00 via INTRAVENOUS

## 2011-10-11 MED ORDER — MIDAZOLAM HCL 10 MG/2ML IJ SOLN
INTRAMUSCULAR | Status: DC | PRN
  Administered 2011-10-11 (×4): 2 mg via INTRAVENOUS

## 2011-10-11 MED ORDER — INSULIN ASPART 100 UNIT/ML ~~LOC~~ SOLN: 0.0000 [IU] | Freq: Three times a day (TID) | SUBCUTANEOUS | Status: DC

## 2011-10-11 MED ORDER — DIPHENHYDRAMINE HCL 50 MG/ML IJ SOLN
INTRAMUSCULAR | Status: AC
  Filled 2011-10-11: qty 1

## 2011-10-11 NOTE — Op Note (Signed)
[facility] [address] [place], [place]  [address]  ENDOSCOPY PROCEDURE REPORT  PATIENT:  [name], [name]  MR#:  [id number] BIRTHDATE:  1961-09-05, 50 yrs. old  GENDER:  female ENDOSCOPIST:  Carie Caddy. Delorese Sellin, MD Referred by:  Mervin Kung Aundria Rud, M.D. PROCEDURE DATE:  10/11/2011 PROCEDURE:  EGD with biopsy for H. pylori [id number] ASA CLASS:  Class II INDICATIONS:  epigastric pain MEDICATIONS:   Benadryl 25 mg IV, Fentanyl 75 mcg IV, Versed 8 mg IV TOPICAL ANESTHETIC:  Cetacaine Spray  DESCRIPTION OF PROCEDURE:   After the risks benefits and alternatives of the procedure were thoroughly explained, informed consent was obtained.  The [id number] ([id number]) endoscope was introduced through the mouth and advanced to the second portion of the duodenum, without limitations.  The instrument was slowly withdrawn as the mucosa was fully examined. <<PROCEDUREIMAGES>>  The esophagus and gastroesophageal junction were completely normal in appearance.  Mild gastritis was found in the antrum. Multiple biopsies were obtained and sent to pathology.  Duodenitis was found in the bulb of the duodenum.  Multiple shallow ulcers were found in the bulb and D1/D2 junction of the duodenum.  No active bleeding was seen.  Retroflexed views revealed no abnormalities. The scope was then withdrawn from the patient and the procedure completed.  COMPLICATIONS:  None  ENDOSCOPIC IMPRESSION: 1) Normal esophagus 2) Mild gastritis.  Multiple biopsies taken to exclude H. Pylori. 3) Duodenitis in the bulb of duodenum.  Biopsies taken to exclude dysplasia. 4) Ulcers, multiple in the bulb of the duodenum and immediate part of D2  RECOMMENDATIONS: 1) Await pathology results. Will need 2 weeks of triple therapy if positive for h. pylori. 2) Avoid NSAIDs 3) Recommend twice daily PPI therapy for at least 2 months, then daily if NSAID use continues.  Carie Caddy. Kathryne Ramella, MD  CC:  The  Patient  n. eSIGNED:   Carie Caddy. Glorious Flicker at 10/11/2011 09:37 AM  Renella Cunas, [phone]

## 2011-10-11 NOTE — Progress Notes (Signed)
Wrong time

## 2011-10-11 NOTE — Discharge Summary (Signed)
DISCHARGE SUMMARY  Patient Name:  [name]  MRN: [id number]  PCP: Sanda Linger, MD, MD  DOB:  10-02-1961     CSN NUMBER :   Date of Admission:  10/08/2011  Date of Discharge:  10/11/2011      Attending Physician: Dr. Ulyess Mort         DISCHARGE DIAGNOSES: 1. Epigastric pain/nausea/vomiting- status post upper GI endoscopy- gastritis, duodenitis and no bleeding small duodenal ulcers. 2. Uncontrolled diabetes mellitus-hemoglobin A1c 12.6- on Lantus, NovoLog sliding scale, metformin. 3. Depression/bipolar disorder. 4. History of hyperlipidemia 5. History of Hypertension   DISPOSITION AND FOLLOW-UP: [name] is to follow-up in [facility] outpatient clinic, at which time, please check for the gastric and duodenal biopsy results for H. Pylori. Start treatment for it if it's positive.  Also reinforce avoiding NSAIDs at least for next 2 months- Preferably longer . - If she continues to use NSAIDs after 2 months of Prilosec 20 mg by mouth twice a day treatment - she will need to use Prilosec 20 mg daily along with that.  - Dr. Rhea Belton with [facility] would be happy to see her if she wants to. She can make an appointment with him. - She also needs to have a good insulin regimen for her diabetes. - Try to make an appointment with Norm Parcel on the same day for diabetic education and management.    Discharge Orders    Future Orders Please Complete By Expires   Diet Carb Modified      Increase activity slowly      Call MD for:  persistant nausea and vomiting      Call MD for:  severe uncontrolled pain      Discharge patient          DISCHARGE MEDICATIONS: Current Discharge Medication List    START taking these medications   Details  !! insulin aspart (NOVOLOG) 100 UNIT/ML injection Inject 0-15 Units into the skin 3 (three) times daily with meals. Qty: 1 pen, Refills: 0    insulin glargine (LANTUS SOLOSTAR) 100 UNIT/ML injection Inject 50 Units into the skin at  bedtime. Qty: 10 mL, Refills: 0    omeprazole (PRILOSEC) 20 MG capsule Take 1 capsule (20 mg total) by mouth 2 (two) times daily. Qty: 60 capsule, Refills: 1    ondansetron (ZOFRAN) 4 MG tablet Take 1 tablet (4 mg total) by mouth every 6 (six) hours as needed. Qty: 20 tablet, Refills: 1    traMADol (ULTRAM) 50 MG tablet Take 1 tablet (50 mg total) by mouth every 8 (eight) hours as needed. Qty: 30 tablet, Refills: 1     !! - Potential duplicate medications found. Please discuss with provider.    CONTINUE these medications which have CHANGED   Details  metFORMIN (GLUCOPHAGE) 1000 MG tablet Take 1 tablet (1,000 mg total) by mouth 2 (two) times daily with a meal. Qty: 60 tablet, Refills: 5      CONTINUE these medications which have NOT CHANGED   Details  DULoxetine (CYMBALTA) 60 MG capsule Take 60 mg by mouth daily.     gabapentin (NEURONTIN) 300 MG capsule Take 600 mg by mouth 2 (two) times daily.      !! insulin aspart (NOVOLOG FLEXPEN) 100 UNIT/ML injection Inject 10-25 Units into the skin 3 (three) times daily before meals. As directed per sliding scale Qty: 30 mL, Refills: 3    !! QUEtiapine (SEROQUEL) 25 MG tablet Take 25 mg by mouth 2 (two) times  daily.      !! QUEtiapine (SEROQUEL) 300 MG tablet Take 300 mg by mouth at bedtime.       !! - Potential duplicate medications found. Please discuss with provider.    STOP taking these medications     pantoprazole (PROTONIX) 40 MG tablet          CONSULTS:  Treatment Team:  Erick Blinks, MD -  [facility] .   PROCEDURES PERFORMED:  Dg Chest 2 View  10/08/2011  *RADIOLOGY REPORT*  Clinical Data: Epigastric/abdominal pain.  Shortness of breath. Hypertension.  Nausea.  CHEST - 2 VIEW  Comparison: 07/10/2011  Findings: Midline trachea.  Normal heart size and mediastinal contours. No pleural effusion or pneumothorax.  Clear lungs.  IMPRESSION: Normal chest.  Original Report Authenticated By: Consuello Bossier, M.D.   Ct Angio Chest  W/cm &/or Wo Cm  10/08/2011  *RADIOLOGY REPORT*  Clinical Data:  Syncope.  Chest pain.  Elevated D-dimer.  CT ANGIOGRAPHY CHEST WITH CONTRAST  Technique:  Multidetector CT imaging of the chest was performed using the standard protocol during bolus administration of intravenous contrast.  Multiplanar CT image reconstructions including MIPs were obtained to evaluate the vascular anatomy.  Contrast:  86 ml Omnipaque-300.  Comparison:  None  Findings:  The chest wall is unremarkable.  No obvious breast masses, supraclavicular or axillary lymphadenopathy.  Nodularity the thyroid gland likely benign goiter.  The bony thorax is intact.  The heart is normal in size.  No pericardial effusion.  No mediastinal or hilar lymphadenopathy.  The esophagus is unremarkable.  The aorta is normal in caliber.  No dissection.  The pulmonary arterial tree is well opacified.  No filling defects to suggest pulmonary emboli.  Examination of the lung parenchyma demonstrates no acute pulmonary findings.  No pleural effusion.  No worrisome pulmonary nodules or mass lesions.  The upper abdomen demonstrates fatty infiltration of the liver.  No upper abdominal mass lesions.  Review of the MIP images confirms the above findings.  IMPRESSION:  1.  No CT findings for pulmonary embolism. 2.  Normal thoracic aorta. 3.  No acute pulmonary findings.  Original Report Authenticated By: P. Loralie Champagne, M.D.   US Abdomen Complete  10/08/2011  *RADIOLOGY REPORT*  Clinical Data:  Epigastric abdominal pain.  COMPLETE ABDOMINAL ULTRASOUND  Comparison:  None  Findings:  Gallbladder:  No gallstones, gallbladder wall thickening, or pericholecystic fluid.  Common bile duct:  Normal in caliber measuring a maximum of 5.64mm.  Liver:  There is diffuse increased echogenicity of the liver and decreased through transmission consistent with fatty infiltration. No focal lesions or biliary dilatation.  IVC:  Normal caliber.  Pancreas:  Not visualized due to overlying  bowel gas.  Spleen:  Normal size and echogenicity without focal lesions.  Right Kidney:  13.2 cm in length. Normal renal cortical thickness and echogenicity without focal lesions or hydronephrosis.  Left Kidney:  12.9 cm in length. Normal renal cortical thickness and echogenicity without focal lesions or hydronephrosis.  Abdominal aorta:  Normal caliber  IMPRESSION:  1.  Normal gallbladder and normal caliber common bile duct. 2.  Diffuse fatty infiltration of the liver. 3.  Non-visualization of the pancreas. 4.  Normal sonographic appearance of the spleen and both kidneys.  Original Report Authenticated By: P. Loralie Champagne, M.D.       ADMISSION DATA: H&P: Patient is a 51 year old woman with a history of depression and peptic ulcer disease who presents with one month of worsening  epigastric pain and anorexia along with nearly a 30 lb weight loss that occurred in the last 2 months. She also has associated nausea and vomiting/spitting up of non-bloody, non-bilious yellowish stomach fluids. She has had gastric reflux for nearly the past 20 years, but she says that this pain feels different from her normal pain which is more of a heaviness. Her current pain comes and goes and is not related to food intake. Patient uses 6-8 OTC tablets of ibuprofen each day for headache and backache as well as her epigastric pain. She also drinks 2 liters of cola each day. She has been eating only crackers and soup occasionally and says she has been taking at most 1 evening meal a day.  She also describes an episode of syncope that occurred last evening when she was walking to the bathroom. She says she felt panicky, nauseous and dizzy and fell to the floor. She denies any tongue biting or loss of bowel/bladder control. She denies any neurological deficits after the event and denies feeling confused after awakening. Before her episode of syncope, she reports feeling like she was trebling, sweaty and numb. She reports a history of  panic attacks that have been occuring about once a week as of late. She denies any history of seizure or stroke. She has had an episode of syncope before which occurred in the setting of a flu-like illness.  Notably, patient had two previous admissions for suicidality in October (10/4-10/11 and 10/22-10/26) with medication overdoses secondary to life stressors. Her medication regimen was changed at that time. Her lithium, trazodone and klonopin were stopped at that time and seroquel was started.    Physical Exam:  Blood pressure 123/86, pulse 123, temperature 98.1 F (36.7 C), temperature source Oral, resp. rate 18, height 5\' 7"  (1.702 m), weight 260 lb 2.3 oz (118 kg), last menstrual period 09/29/2011, SpO2 94.00%.  General: obese woman resting in bed, appears depressed but in no apparent distress  HEENT: PERRL, EOMI, no scleral icterus  Cardiac: RRR, no rubs, murmurs or gallops  Pulm: clear to auscultation bilaterally, moving normal volumes of air  Abd: soft, tender to palpation in episgatrium, nondistended, BS present  Ext: warm and well perfused, no pedal edema  Neuro: alert and oriented X3, cranial nerves II-XII grossly intact    Labs: Basic Metabolic Panel:  Basename  10/08/11 0624   NA  131*   K  4.5   CL  93*   CO2  21   GLUCOSE  446*   BUN  9   CREATININE  0.65   CALCIUM  11.7*   MG  --   PHOS  --    Liver Function Tests:  [facility]  10/08/11 0624   AST  13   ALT  12   ALKPHOS  122*   BILITOT  0.3   PROT  7.7   ALBUMIN  3.6     Basename  10/08/11 0624   LIPASE  15   AMYLASE  --    CBC:  Basename  10/08/11 0624   WBC  10.0   NEUTROABS  6.1   HGB  15.0   HCT  43.1   MCV  83.0   PLT  339    Cardiac Enzymes:  Basename  10/08/11 1652   CKTOTAL  27   CKMB  2.1   CKMBINDEX  --   TROPONINI  <0.30      Ref. Range  10/08/2011 07:46   Color, Urine  Latest  Range: YELLOW   YELLOW   APPearance  Latest Range: CLEAR   CLEAR   Specific Gravity, Urine  Latest  Range: 1.005-1.030   1.038 (H)   pH  Latest Range: 5.0-8.0   5.5   Glucose, UA  Latest Range: NEGATIVE mg/dL  >6962 (A)   Bilirubin Urine  Latest Range: NEGATIVE   MODERATE (A)   Ketones, ur  Latest Range: NEGATIVE mg/dL  >95 (A)   Protein  Latest Range: NEGATIVE mg/dL  NEGATIVE   Urobilinogen, UA  Latest Range: 0.0-1.0 mg/dL  0.2   Nitrite  Latest Range: NEGATIVE   NEGATIVE   Leukocytes, UA  Latest Range: NEGATIVE   NEGATIVE   WBC, UA  Latest Range: <3 WBC/hpf  0-2   RBC / HPF  Latest Range: <3 RBC/hpf  3-6   Squamous Epithelial / LPF  Latest Range: RARE   MANY (A)   Bacteria, UA  Latest Range: RARE   FEW (A)    D-Dimer:  Alvira Philips  10/08/11 0910   DDIMER  0.67*        HOSPITAL COURSE: Patient was admitted with nausea vomiting and epigastric abdominal pain- underwent upper GI endoscopy- found to have gastritis, duodenitis and small duodenal ulcers. Patient taking too many NSAIDs prior to admission. Pain is better at the time of discharge. Patient was tolerating food well without any vomiting. She did have some nausea.    DISCHARGE DATA: Vital Signs: BP 143/85  Pulse 85  Temp(Src) 98.8 F (37.1 C) (Oral)  Resp 18  Ht 5\' 7"  (1.702 m)  Wt 260 lb 2.3 oz (118 kg)  BMI 40.74 kg/m2  SpO2 95%  LMP 09/29/2011  Labs: Results for orders placed during the hospital encounter of 10/08/11 (from the past 24 hour(s))  GLUCOSE, CAPILLARY     Status: Abnormal   Collection Time   10/10/11  5:18 PM      Component Value Range   Glucose-Capillary 279 (*) 70 - 99 (mg/dL)  GLUCOSE, CAPILLARY     Status: Abnormal   Collection Time   10/10/11  8:08 PM      Component Value Range   Glucose-Capillary 213 (*) 70 - 99 (mg/dL)  GLUCOSE, CAPILLARY     Status: Abnormal   Collection Time   10/11/11  7:00 AM      Component Value Range   Glucose-Capillary 230 (*) 70 - 99 (mg/dL)  GLUCOSE, CAPILLARY     Status: Abnormal   Collection Time   10/11/11  7:38 AM      Component Value Range    Glucose-Capillary 215 (*) 70 - 99 (mg/dL)   Comment 1 Call MD NNP PA CNM    GLUCOSE, CAPILLARY     Status: Abnormal   Collection Time   10/11/11 12:19 PM      Component Value Range   Glucose-Capillary 302 (*) 70 - 99 (mg/dL)      Signed: Lyn Hollingshead, MD PGY 2, Internal Medicine Resident 10/11/2011, 2:27 PM

## 2011-10-12 ENCOUNTER — Encounter (HOSPITAL_COMMUNITY): Payer: Self-pay | Admitting: Internal Medicine

## 2011-10-13 ENCOUNTER — Encounter: Payer: Self-pay | Admitting: Internal Medicine

## 2011-10-28 ENCOUNTER — Emergency Department (HOSPITAL_COMMUNITY)
Admission: EM | Admit: 2011-10-28 | Discharge: 2011-10-28 | Disposition: A | Discharge status: Home / Self Care | Payer: Medicare Other | Attending: Emergency Medicine | Admitting: Emergency Medicine

## 2011-10-28 ENCOUNTER — Encounter (HOSPITAL_COMMUNITY): Payer: Self-pay

## 2011-10-28 DIAGNOSIS — L0291 Cutaneous abscess, unspecified: Secondary | ICD-10-CM

## 2011-10-28 DIAGNOSIS — Z794 Long term (current) use of insulin: Secondary | ICD-10-CM | POA: Insufficient documentation

## 2011-10-28 DIAGNOSIS — I10 Essential (primary) hypertension: Secondary | ICD-10-CM | POA: Insufficient documentation

## 2011-10-28 DIAGNOSIS — E785 Hyperlipidemia, unspecified: Secondary | ICD-10-CM | POA: Insufficient documentation

## 2011-10-28 DIAGNOSIS — Z79899 Other long term (current) drug therapy: Secondary | ICD-10-CM | POA: Insufficient documentation

## 2011-10-28 DIAGNOSIS — F341 Dysthymic disorder: Secondary | ICD-10-CM | POA: Insufficient documentation

## 2011-10-28 DIAGNOSIS — K029 Dental caries, unspecified: Secondary | ICD-10-CM | POA: Insufficient documentation

## 2011-10-28 DIAGNOSIS — K219 Gastro-esophageal reflux disease without esophagitis: Secondary | ICD-10-CM | POA: Insufficient documentation

## 2011-10-28 DIAGNOSIS — R51 Headache: Secondary | ICD-10-CM | POA: Insufficient documentation

## 2011-10-28 DIAGNOSIS — L0390 Cellulitis, unspecified: Secondary | ICD-10-CM

## 2011-10-28 DIAGNOSIS — E119 Type 2 diabetes mellitus without complications: Secondary | ICD-10-CM | POA: Insufficient documentation

## 2011-10-28 DIAGNOSIS — Z8711 Personal history of peptic ulcer disease: Secondary | ICD-10-CM | POA: Insufficient documentation

## 2011-10-28 DIAGNOSIS — E669 Obesity, unspecified: Secondary | ICD-10-CM | POA: Insufficient documentation

## 2011-10-28 DIAGNOSIS — N61 Mastitis without abscess: Secondary | ICD-10-CM | POA: Insufficient documentation

## 2011-10-28 DIAGNOSIS — N644 Mastodynia: Secondary | ICD-10-CM | POA: Insufficient documentation

## 2011-10-28 MED ORDER — CLINDAMYCIN HCL 300 MG PO CAPS
450.0000 mg | ORAL_CAPSULE | Freq: Three times a day (TID) | ORAL | Status: DC
  Administered 2011-10-28: 450 mg via ORAL
  Filled 2011-10-28 (×3): qty 1

## 2011-10-28 MED ORDER — CLINDAMYCIN HCL 150 MG PO CAPS: 450.0000 mg | ORAL_CAPSULE | Freq: Three times a day (TID) | ORAL | Status: AC

## 2011-10-28 MED ORDER — CLINDAMYCIN HCL 150 MG PO CAPS: 450.0000 mg | ORAL_CAPSULE | Freq: Three times a day (TID) | ORAL | Status: DC

## 2011-10-28 NOTE — ED Notes (Signed)
Pt. [place] [facility] [place] pt. Has a sore/abscess on her lt. Breast, began draining yesterday

## 2011-10-28 NOTE — ED Provider Notes (Signed)
I saw and evaluated the patient, reviewed the resident's note and I agree with the findings and plan.  51 year old female who has what appears to be small abscess to the inferior aspect of her left breast. There is a small area of surrounding cellulitis. Mild tenderness on palpation. No active draining. No fluctuance. Does not appear to be tracking deeper. Patient is clinically well appearing. Afebrile. Does have a  history of diabetes though. Does not appear to be any collection that is drainable at this point in time. Do not feel that patient needs imaging such as an ultrasound. We'll give a course of by mouth antibiotics. Patient instructed that she needs to return to have her cellulitis reevaluated. Strict return precautions discussed for sooner reevaluation.  Raeford Razor, MD 10/28/11 [phone]

## 2011-10-28 NOTE — Progress Notes (Signed)
Consult to provide medication assistance. However, since patient has Medicare, we are unable to provide any assistance.

## 2011-10-28 NOTE — ED Provider Notes (Signed)
History     CSN: [id number]  Arrival date & time 10/28/11  1136   First MD Initiated Contact with Patient 10/28/11 1158      Chief Complaint  Patient presents with  . Abscess     HPI Pt w/ 4 day h/o increasing redness and pain under L breast, and 1 day h/o purulent bloody drainage. No mildly painful to palpation. Pt went to health dept today and was sent here to the ED. Denies HA, syncope, rash, fever, palpitations, SOB. No recent sick contacts.     Past Medical History  Diagnosis Date  . Anemia     iron deficiency  . Anxiety   . Depression   . Diabetes mellitus     type 2  . Hyperlipidemia   . Hypertension   . GERD (gastroesophageal reflux disease)   . Headache   . Peptic ulcer disease   . History of colonic polyps     Past Surgical History  Procedure Date  . Tubal ligation   . Esophagogastroduodenoscopy 10/11/2011    Procedure: ESOPHAGOGASTRODUODENOSCOPY (EGD);  Surgeon: Erick Blinks, MD;  Location: [facility] ENDOSCOPY;  Service: Gastroenterology;  Laterality: N/A;    Family History  Problem Relation Age of Onset  . Alcohol abuse Other   . Drug abuse Other   . Arthritis Other   . Diabetes Other   . Depression Other   . Hypertension Other   . Kidney disease Other   . Cancer Other     Breast,Colon,Lung, and Prostate Cancer  . Stomach cancer Father 14  . Heart attack Brother 51  . Diabetes type II      brother, both parents    History  Substance Use Topics  . Smoking status: Current Everyday Smoker -- 0.5 packs/day for 25 years    Types: Cigarettes  . Smokeless tobacco: Never Used  . Alcohol Use: No    OB History    Grav Para Term Preterm Abortions TAB SAB Ect Mult Living                  Review of Systems  Constitutional: Negative for fever, chills, diaphoresis, activity change and appetite change.  HENT: Positive for dental problem. Negative for sore throat and neck stiffness.   Respiratory: Negative for chest tightness and shortness of breath.     Cardiovascular: Negative for chest pain, palpitations and leg swelling.  Gastrointestinal: Negative for nausea, vomiting, abdominal pain, diarrhea, constipation and blood in stool.  Genitourinary: Negative for dysuria, hematuria, flank pain and difficulty urinating.  Skin: Positive for color change and wound.  Neurological: Positive for headaches. Negative for dizziness, syncope, light-headedness and numbness.    Allergies  Review of patient's allergies indicates no known allergies.  Home Medications   Current Outpatient Rx  Name Route Sig Dispense Refill  . DULOXETINE HCL 60 MG PO CPEP Oral Take 60 mg by mouth daily.     Marland Kitchen GABAPENTIN 300 MG PO CAPS Oral Take 600 mg by mouth 2 (two) times daily.      . INSULIN ASPART 100 UNIT/ML [place] SOLN Subcutaneous Inject 10-25 Units into the skin 3 (three) times daily before meals. As directed per sliding scale 30 mL 3  . INSULIN GLARGINE 100 UNIT/ML [place] SOLN Subcutaneous Inject 60 Units into the skin at bedtime.    Marland Kitchen METFORMIN HCL 1000 MG PO TABS Oral Take 1,000 mg by mouth 2 (two) times daily with a meal.    . OMEPRAZOLE 20 MG PO CPDR Oral  Take 20 mg by mouth daily.    . QUETIAPINE FUMARATE 25 MG PO TABS Oral Take 25 mg by mouth 2 (two) times daily.      . QUETIAPINE FUMARATE 300 MG PO TABS Oral Take 300 mg by mouth at bedtime.      . TRAMADOL HCL 50 MG PO TABS Oral Take 50 mg by mouth every 6 (six) hours as needed. For pain      BP 151/78  Pulse 104  Temp(Src) 98.2 F (36.8 C) (Oral)  Resp 16  Ht 5\' 6"  (1.676 m)  Wt 280 lb (127.007 kg)  BMI 45.19 kg/m2  SpO2 97%  LMP 10/17/2011  Physical Exam  Constitutional: She is oriented to person, place, and time. She appears well-developed and well-nourished. No distress.       Obese   HENT:  Head: Normocephalic.  Right Ear: External ear normal.  Left Ear: External ear normal.  Nose: Nose normal.       Numerous dental caries  Neck: Normal range of motion. Neck supple. No JVD present. No  tracheal deviation present. No thyromegaly present.  Cardiovascular: Normal rate, regular rhythm, normal heart sounds and intact distal pulses.  Exam reveals no gallop and no friction rub.   No murmur heard. Pulmonary/Chest: Effort normal and breath sounds normal. No respiratory distress. She has no wheezes. She exhibits no tenderness.  Abdominal: Soft. Bowel sounds are normal. There is no tenderness.  Musculoskeletal: Normal range of motion.  Lymphadenopathy:    She has no cervical adenopathy.  Neurological: She is alert and oriented to person, place, and time. No cranial nerve deficit.  Skin: Skin is warm. No rash noted. She is diaphoretic.       Area of erythema under L breast approximately 10 1/2 x 7cm w/ an area of open ulceration in the center approximately  1 1/2 x 1cm. Small sinus tract with minimal active purulent bloody discharge.      ED Course  Procedures (including critical care time)  Wound Cx x2   MDM     50yo Female w/ 4 day h/o cellulitis and 1 day h/o purulent drainage  1. Abscess: Pt poorly controlled diabetic w/ L breast abscess. Last HgbA1c > 12.  - wound cx x2 - area of erythema marked and dated - start clinda   Follow-up items 1. Cellulitis/abscess: Pt instructed to f/u w/ health dept within the next 1-2days 2. DM: poorly controlled.      Shelly Flatten, MD 10/28/11 1355

## 2011-10-29 NOTE — ED Provider Notes (Signed)
I saw and evaluated the patient, reviewed the resident's note and I agree with the findings and plan.   Raeford Razor, MD 10/29/11 [phone]

## 2011-10-31 LAB — WOUND CULTURE: Culture: NO GROWTH

## 2012-01-08 ENCOUNTER — Other Ambulatory Visit (HOSPITAL_COMMUNITY): Payer: Self-pay | Admitting: Internal Medicine

## 2012-02-17 ENCOUNTER — Ambulatory Visit: Payer: Medicare Other

## 2013-11-09 ENCOUNTER — Telehealth: Payer: Self-pay | Admitting: Internal Medicine

## 2013-11-09 NOTE — Telephone Encounter (Signed)
Sherry with [employer] is calling in regrds to the order form they received back from our office, however, step 1 & step 2 were not completed on the form. SHe is asking that these be completed and faxed back to them.  Any questions can be addressed by calling them back at Ph# [phone]  Ref#: [id number].   Form scanned in media tab on 11/03/2012.

## 2013-11-10 NOTE — Telephone Encounter (Signed)
Questions 1 and 2 answered on form and faxed back to [employer] at [phone].

## 2015-01-11 ENCOUNTER — Other Ambulatory Visit: Payer: Self-pay

## 2015-01-11 NOTE — Patient Outreach (Signed)
[facility]) Care Management  01/11/2015  [name] 1961/02/10 [id number]   SUBJECTIVE:  [facility].  Discussed and offered [facility] services to patient.  Patient refuses services at this time.  Patient verbalized agreement to receive contact information for [facility] for future reference.   Patient states She is working with [employer] [facility] [employer] regarding coverage issues for services she has received.  States she sees Dr. Vesta [facility] for mental Health and attends a clinic regularly.  Patient reports the clinic is non participating provider [facility] and [facility].    Patient reports she is diabetic and checks her blood sugars 2-3 times daily.  Sates her blood sugars are presently averaging between 140 to 170's.  Patient states this is good for her because a year ago they would run in the 400's.  Patient reports her A1c has decrease from 12 plus to around 7.  Patient reports she has and takes all of her medication as prescribed by her doctor.  Patient confirms she has transportation to her appointments.   PLAN: RNCM will  Forward to Nena PolioLisa Moore to close patient due to refusal of services. RNCM will notify patients primary MD of refusal of services.  RNCM will send patient [facility] contact information as requested.    [name] InaDavina Jayquan Bradsher RN,BSN,CCM [facility] Telephonic Care Coordinator [phone]

## 2015-01-15 NOTE — Patient Outreach (Signed)
[facility]) Care Management  01/15/2015  [name] 1961-07-06 [id number]   Received notification from George Inaavina Green, RN to close case due to patient refused to participate [facility].  Case closed at this time.  Corrie MckusickLisa O. [facility] CM Assistant Phone: [phone] Fax: [phone]

## 2015-07-18 DIAGNOSIS — D649 Anemia, unspecified: Secondary | ICD-10-CM | POA: Diagnosis not present

## 2015-07-18 DIAGNOSIS — E7800 Pure hypercholesterolemia, unspecified: Secondary | ICD-10-CM | POA: Diagnosis not present

## 2015-07-18 DIAGNOSIS — J449 Chronic obstructive pulmonary disease, unspecified: Secondary | ICD-10-CM | POA: Diagnosis not present

## 2015-07-18 DIAGNOSIS — K219 Gastro-esophageal reflux disease without esophagitis: Secondary | ICD-10-CM | POA: Diagnosis not present

## 2015-07-18 DIAGNOSIS — E1140 Type 2 diabetes mellitus with diabetic neuropathy, unspecified: Secondary | ICD-10-CM | POA: Diagnosis not present

## 2015-07-18 DIAGNOSIS — I10 Essential (primary) hypertension: Secondary | ICD-10-CM | POA: Diagnosis not present

## 2015-08-01 DIAGNOSIS — Z1231 Encounter for screening mammogram for malignant neoplasm of breast: Secondary | ICD-10-CM | POA: Diagnosis not present

## 2015-09-19 DIAGNOSIS — L84 Corns and callosities: Secondary | ICD-10-CM | POA: Diagnosis not present

## 2015-09-19 DIAGNOSIS — S29012A Strain of muscle and tendon of back wall of thorax, initial encounter: Secondary | ICD-10-CM | POA: Diagnosis not present

## 2015-09-19 DIAGNOSIS — S90122A Contusion of left lesser toe(s) without damage to nail, initial encounter: Secondary | ICD-10-CM | POA: Diagnosis not present

## 2015-09-19 DIAGNOSIS — Z6841 Body Mass Index (BMI) 40.0 and over, adult: Secondary | ICD-10-CM | POA: Diagnosis not present

## 2015-10-09 ENCOUNTER — Encounter: Payer: Self-pay | Admitting: Sports Medicine

## 2015-10-09 ENCOUNTER — Ambulatory Visit (INDEPENDENT_AMBULATORY_CARE_PROVIDER_SITE_OTHER): Payer: Medicare Other

## 2015-10-09 ENCOUNTER — Ambulatory Visit (INDEPENDENT_AMBULATORY_CARE_PROVIDER_SITE_OTHER): Payer: Medicare Other | Admitting: Sports Medicine

## 2015-10-09 DIAGNOSIS — E1142 Type 2 diabetes mellitus with diabetic polyneuropathy: Secondary | ICD-10-CM

## 2015-10-09 DIAGNOSIS — L97529 Non-pressure chronic ulcer of other part of left foot with unspecified severity: Secondary | ICD-10-CM

## 2015-10-09 DIAGNOSIS — E11621 Type 2 diabetes mellitus with foot ulcer: Secondary | ICD-10-CM

## 2015-10-09 DIAGNOSIS — M79672 Pain in left foot: Secondary | ICD-10-CM

## 2015-10-09 DIAGNOSIS — L89891 Pressure ulcer of other site, stage 1: Secondary | ICD-10-CM

## 2015-10-09 DIAGNOSIS — S92912A Unspecified fracture of left toe(s), initial encounter for closed fracture: Secondary | ICD-10-CM

## 2015-10-09 NOTE — Progress Notes (Signed)
Patient ID: [name], female   DOB: 04-Jul-1961, 55 y.o.   MRN: [id number] Subjective: [name] is a 55 y.o. diabetic female patient who presents to office for evaluation of Left foot pain/fracture. Patient states that over 7 weeks ago she noticed her big toe swollen and had an xray done and was told she had a fracture; patient states that she had no further care but decided to come because the toe is still swollen and she wanted to make sure she wasn't going to lose the toe. Patient denies pain; states that she doesn't feel because of the neuropathy. Doesn't recall trauma or any other cause. Patient denies any other pedal complaints.   Patient Active Problem List   Diagnosis Date Noted  . Preventative health care 04/14/2011  . Cough 03/11/2011  . DYSMETABOLIC SYNDROME 12/08/2010  . ANEMIA, B12 DEFICIENCY 10/01/2010  . ULNAR NEUROPATHY, BILATERAL 10/01/2010  . DIABETES MELLITUS, TYPE II 09/11/2010  . HYPERLIPIDEMIA 09/11/2010  . OBESITY 09/11/2010  . ANEMIA-IRON DEFICIENCY 09/11/2010  . ANXIETY 09/11/2010  . TOBACCO USE 09/11/2010  . Depressive disorder, not elsewhere classified 09/11/2010  . Unspecified essential hypertension 09/11/2010  . GERD 09/11/2010  . PEPTIC ULCER DISEASE 09/11/2010  . Headache(784.0) 09/11/2010  . COLONIC POLYPS, HX OF 09/11/2010   Current Outpatient Prescriptions on File Prior to Visit  Medication Sig Dispense Refill  . DULoxetine (CYMBALTA) 60 MG capsule Take 60 mg by mouth daily.     Marland Kitchen gabapentin (NEURONTIN) 300 MG capsule Take 600 mg by mouth 2 (two) times daily.      . insulin aspart (NOVOLOG FLEXPEN) 100 UNIT/ML injection Inject 10-25 Units into the skin 3 (three) times daily before meals. As directed per sliding scale 30 mL 3  . insulin glargine (LANTUS) 100 UNIT/ML injection Inject 60 Units into the skin at bedtime.    . metFORMIN (GLUCOPHAGE) 1000 MG tablet Take 1,000 mg by mouth 2 (two) times daily with a meal.    . omeprazole (PRILOSEC) 20  MG capsule Take 20 mg by mouth daily.    . QUEtiapine (SEROQUEL) 25 MG tablet Take 25 mg by mouth 2 (two) times daily.      . QUEtiapine (SEROQUEL) 300 MG tablet Take 300 mg by mouth at bedtime.      . traMADol (ULTRAM) 50 MG tablet Take 50 mg by mouth every 6 (six) hours as needed. For pain     No current facility-administered medications on file prior to visit.   No Known Allergies  Patient reported A1c 7.6  Objective:  General: Alert and oriented x3 in no acute distress  Dermatology: Preulcerative lesion at medial left hallux once debrided a pinpoint underlying partial thickness ulceration was present with granular base with no signs of infection, no webspace macerations, no ecchymosis bilateral, all nails x 10 are short and dystrophic.  Vascular: + focal edema to left hallux. Dorsalis Pedis and Posterior Tibial pedal pulses 2/4, Capillary Fill Time 3 seconds, scant pedal hair growth bilateral, Temperature gradient within normal limits.  Neurology: Michaell Cowing sensation intact via light touch bilateral, Protective sensation severely diminished with Semmes Weinstein Monofilament to all pedal sites, Vibratory absent to level of ankles bilateral.   Musculoskeletal: There is notenderness with palpation at Left hallux, There is not pain present with tuning fork to left hallux,No pain with calf compression bilateral. All joint range of motion is within normal limits except at left hallux IPJ, Strength within normal limits in all groups bilateral.   Xrays  Left foot   Impression: Normal osseous mineralization, Fracture at hallux IPJ with joint depression with almost complete healing with mild malposition, lesser claw toe deformity, severe heel spurs, mild soft tissue swelling to left hallux, no foreign body, no other acute pathology.     Assessment and Plan: Problem List Items Addressed This Visit    None    Visit Diagnoses    Left foot pain    -  Primary    Relevant Orders    DG Foot 2  Views Left    Diabetic ulcer of left foot associated with type 2 diabetes mellitus (HCC)        Medial Hallux with no acute signs of infection    Toe fracture, left, closed, initial encounter        Hallux IPJ    Diabetic polyneuropathy associated with type 2 diabetes mellitus (HCC)           -Complete examination performed -Xrays reviewed -Discussed treatement options for fracture; risks, alternatives, and benefits explained. -Instructed patient on application to coban toe splint to assist with swelling control. Recommend stiff sole shoe and protection, rest, ice, elevation daily until symptoms improve -Mechanically debrided ulceration site to left medial hallux and applied topical antibiotic cream and advised patient to do the same at home daily. Advised to monitor for signs of infection if present to come to office immediately or go to ER -Patient to return to office in 4 weeks for serial x-rays to assess healing and ulcer recheck  or sooner if condition worsens.  Asencion Islamitorya Eudora Guevarra, DPM

## 2015-10-21 DIAGNOSIS — I10 Essential (primary) hypertension: Secondary | ICD-10-CM | POA: Diagnosis not present

## 2015-10-21 DIAGNOSIS — E1140 Type 2 diabetes mellitus with diabetic neuropathy, unspecified: Secondary | ICD-10-CM | POA: Diagnosis not present

## 2015-10-21 DIAGNOSIS — J449 Chronic obstructive pulmonary disease, unspecified: Secondary | ICD-10-CM | POA: Diagnosis not present

## 2015-10-21 DIAGNOSIS — E7800 Pure hypercholesterolemia, unspecified: Secondary | ICD-10-CM | POA: Diagnosis not present

## 2015-10-21 DIAGNOSIS — K219 Gastro-esophageal reflux disease without esophagitis: Secondary | ICD-10-CM | POA: Diagnosis not present

## 2015-11-06 DIAGNOSIS — Z1389 Encounter for screening for other disorder: Secondary | ICD-10-CM | POA: Diagnosis not present

## 2015-11-06 DIAGNOSIS — J45901 Unspecified asthma with (acute) exacerbation: Secondary | ICD-10-CM | POA: Diagnosis not present

## 2015-11-06 DIAGNOSIS — R6889 Other general symptoms and signs: Secondary | ICD-10-CM | POA: Diagnosis not present

## 2015-11-13 ENCOUNTER — Ambulatory Visit: Payer: Medicare Other | Admitting: Sports Medicine

## 2015-12-11 ENCOUNTER — Encounter: Payer: Self-pay | Admitting: Sports Medicine

## 2015-12-11 ENCOUNTER — Ambulatory Visit (INDEPENDENT_AMBULATORY_CARE_PROVIDER_SITE_OTHER): Payer: Medicare Other

## 2015-12-11 ENCOUNTER — Ambulatory Visit (INDEPENDENT_AMBULATORY_CARE_PROVIDER_SITE_OTHER): Payer: Medicare Other | Admitting: Sports Medicine

## 2015-12-11 DIAGNOSIS — E11621 Type 2 diabetes mellitus with foot ulcer: Secondary | ICD-10-CM

## 2015-12-11 DIAGNOSIS — L89891 Pressure ulcer of other site, stage 1: Secondary | ICD-10-CM

## 2015-12-11 DIAGNOSIS — M79672 Pain in left foot: Secondary | ICD-10-CM

## 2015-12-11 DIAGNOSIS — E1142 Type 2 diabetes mellitus with diabetic polyneuropathy: Secondary | ICD-10-CM

## 2015-12-11 DIAGNOSIS — L97529 Non-pressure chronic ulcer of other part of left foot with unspecified severity: Secondary | ICD-10-CM

## 2015-12-11 DIAGNOSIS — S92912D Unspecified fracture of left toe(s), subsequent encounter for fracture with routine healing: Secondary | ICD-10-CM

## 2015-12-11 NOTE — Progress Notes (Signed)
Patient ID: [name], female   DOB: 09/09/1961, 55 y.o.   MRN: [id number] Subjective: [name] is a 55 y.o. diabetic female patient who retunrss to office for evaluation of Left foot pain/fracture and ulceration at medial left big toe. Patient states that She has been covering area with Neosporin and Band-Aid area has callused over with a little bit of dried blood underneath the skin.  Denies constitutional symptoms. Patient denies any other pedal complaints.   Patient Active Problem List   Diagnosis Date Noted  . Preventative health care 04/14/2011  . Cough 03/11/2011  . DYSMETABOLIC SYNDROME 12/08/2010  . ANEMIA, B12 DEFICIENCY 10/01/2010  . ULNAR NEUROPATHY, BILATERAL 10/01/2010  . DIABETES MELLITUS, TYPE II 09/11/2010  . HYPERLIPIDEMIA 09/11/2010  . OBESITY 09/11/2010  . ANEMIA-IRON DEFICIENCY 09/11/2010  . ANXIETY 09/11/2010  . TOBACCO USE 09/11/2010  . Depressive disorder, not elsewhere classified 09/11/2010  . Unspecified essential hypertension 09/11/2010  . GERD 09/11/2010  . PEPTIC ULCER DISEASE 09/11/2010  . Headache(784.0) 09/11/2010  . COLONIC POLYPS, HX OF 09/11/2010   Current Outpatient Prescriptions on File Prior to Visit  Medication Sig Dispense Refill  . DULoxetine (CYMBALTA) 60 MG capsule Take 60 mg by mouth daily.     Marland Kitchen. gabapentin (NEURONTIN) 300 MG capsule Take 600 mg by mouth 2 (two) times daily.      . insulin aspart (NOVOLOG FLEXPEN) 100 UNIT/ML injection Inject 10-25 Units into the skin 3 (three) times daily before meals. As directed per sliding scale 30 mL 3  . insulin glargine (LANTUS) 100 UNIT/ML injection Inject 60 Units into the skin at bedtime.    . metFORMIN (GLUCOPHAGE) 1000 MG tablet Take 1,000 mg by mouth 2 (two) times daily with a meal.    . omeprazole (PRILOSEC) 20 MG capsule Take 20 mg by mouth daily.    . QUEtiapine (SEROQUEL) 25 MG tablet Take 25 mg by mouth 2 (two) times daily.      . QUEtiapine (SEROQUEL) 300 MG tablet Take 300 mg by  mouth at bedtime.      . traMADol (ULTRAM) 50 MG tablet Take 50 mg by mouth every 6 (six) hours as needed. For pain     No current facility-administered medications on file prior to visit.   No Known Allergies  Patient reported A1c 7.3  Objective:  General: Alert and oriented x3 in no acute distress  Dermatology: Preulcerative lesion at medial left hallux once debrided a pinpoint underlying partial thickness ulceration was present with granular base  Measuring 0.3 cm x 0.1cm x 0.1cm with no signs of infection, no webspace macerations, no ecchymosis bilateral, all nails x 10 are short and dystrophic.  Vascular:  Improved focal edema to left hallux. Dorsalis Pedis and Posterior Tibial pedal pulses 2/4, Capillary Fill Time 3 seconds, scant pedal hair growth bilateral, Temperature gradient within normal limits.  Neurology: Michaell CowingGross sensation intact via light touch bilateral, Protective sensation severely diminished with Semmes Weinstein Monofilament to all pedal sites, Vibratory absent to level of ankles bilateral.   Musculoskeletal: There is no tenderness with palpation at Left hallux, There continues to be no pain present with tuning fork to left hallux,No pain with calf compression bilateral. All joint range of motion is within normal limits except at left hallux IPJ  Where there is slight deformity and likely traumatic arthritis secondary to fracture, Strength within normal limits in all groups bilateral.   Xrays  Left foot   Impression: Normal osseous mineralization, Fracture at hallux IPJ with  joint depression with almost complete healing with mild malposition  Unchanged from prior, lesser claw toe deformity, severe heel spurs, mild soft tissue swelling to left hallux, no foreign body, no other acute pathology.     Assessment and Plan: Problem List Items Addressed This Visit    None    Visit Diagnoses    Left foot pain    -  Primary    Relevant Orders    DG Foot Complete Left    Toe  fracture, left, with routine healing, subsequent encounter        Diabetic ulcer of left foot associated with type 2 diabetes mellitus (HCC)        Improving almost resolved    Diabetic polyneuropathy associated with type 2 diabetes mellitus (HCC)           -Complete examination performed -Xrays reviewed -Discussed treatement options for fracture; risks, alternatives, and benefits explained. -Recommend stiff sole shoe and protection, rest, ice, elevation daily until  Complete healing of fractures achieved -Safe step diabetic shoe order form was completed; office to contact primary care for approval / certification;  Office to arrange shoe fitting and dispensing. -Mechanically debrided ulceration site using sterile chisel blade to left medial hallux and applied topical antibiotic cream  And gave foam offloading pad. Advised patient to do the same at home daily consisting of topical antibiotic and Band-Aid to site, and wearing offloading pad daily. Advised to monitor for signs of infection if present to come to office immediately or go to ER -Patient to return to office in 4 weeks for serial x-rays to assess healing and ulcer recheck  or sooner if condition worsens.  Asencion Islam, DPM

## 2016-01-09 ENCOUNTER — Ambulatory Visit: Payer: Medicare Other | Admitting: Sports Medicine

## 2016-02-12 ENCOUNTER — Encounter: Payer: Self-pay | Admitting: Sports Medicine

## 2016-02-12 ENCOUNTER — Ambulatory Visit (INDEPENDENT_AMBULATORY_CARE_PROVIDER_SITE_OTHER): Payer: Medicare Other | Admitting: Sports Medicine

## 2016-02-12 ENCOUNTER — Ambulatory Visit: Payer: Medicare Other | Admitting: Sports Medicine

## 2016-02-12 DIAGNOSIS — M79672 Pain in left foot: Secondary | ICD-10-CM

## 2016-02-12 DIAGNOSIS — E1142 Type 2 diabetes mellitus with diabetic polyneuropathy: Secondary | ICD-10-CM

## 2016-02-12 DIAGNOSIS — E11621 Type 2 diabetes mellitus with foot ulcer: Secondary | ICD-10-CM

## 2016-02-12 DIAGNOSIS — L89891 Pressure ulcer of other site, stage 1: Secondary | ICD-10-CM | POA: Diagnosis not present

## 2016-02-12 DIAGNOSIS — S92912D Unspecified fracture of left toe(s), subsequent encounter for fracture with routine healing: Secondary | ICD-10-CM

## 2016-02-12 DIAGNOSIS — L97529 Non-pressure chronic ulcer of other part of left foot with unspecified severity: Principal | ICD-10-CM

## 2016-02-12 NOTE — Progress Notes (Signed)
Patient ID: [name], female   DOB: 1960/12/09, 55 y.o.   MRN: [id number] Subjective: [name] is a 55 y.o. female patient seen in office for evaluation of ulceration of the left big toe. Patient has a history of diabetes and a blood glucose level today of 271 mg/dl. Patient is not changing the dressing. States that bloody callus has been very dry. Thus, thinking that she did not require anything to be put on it. Patient came in today for management for diabetic shoes and I was alerted by office staff that she should be evaluated by Dr. Denies nausea/fever/vomiting/chills/night sweats/shortness of breath/pain. Patient has no other pedal complaints at this time.  Patient Active Problem List   Diagnosis Date Noted  . Preventative health care 04/14/2011  . Cough 03/11/2011  . DYSMETABOLIC SYNDROME 12/08/2010  . ANEMIA, B12 DEFICIENCY 10/01/2010  . ULNAR NEUROPATHY, BILATERAL 10/01/2010  . DIABETES MELLITUS, TYPE II 09/11/2010  . HYPERLIPIDEMIA 09/11/2010  . OBESITY 09/11/2010  . ANEMIA-IRON DEFICIENCY 09/11/2010  . ANXIETY 09/11/2010  . TOBACCO USE 09/11/2010  . Depressive disorder, not elsewhere classified 09/11/2010  . Unspecified essential hypertension 09/11/2010  . GERD 09/11/2010  . PEPTIC ULCER DISEASE 09/11/2010  . Headache(784.0) 09/11/2010  . COLONIC POLYPS, HX OF 09/11/2010  . Clinical depression 02/27/2010  . Diabetes mellitus (HCC) 02/27/2010  . Lumbosacral spondylosis 02/27/2010   Current Outpatient Prescriptions on File Prior to Visit  Medication Sig Dispense Refill  . DULoxetine (CYMBALTA) 60 MG capsule Take 60 mg by mouth daily.     Marland Kitchen. gabapentin (NEURONTIN) 300 MG capsule Take 600 mg by mouth 2 (two) times daily.      . insulin aspart (NOVOLOG FLEXPEN) 100 UNIT/ML injection Inject 10-25 Units into the skin 3 (three) times daily before meals. As directed per sliding scale 30 mL 3  . insulin glargine (LANTUS) 100 UNIT/ML injection Inject 60 Units into the skin at  bedtime.    . metFORMIN (GLUCOPHAGE) 1000 MG tablet Take 1,000 mg by mouth 2 (two) times daily with a meal.    . omeprazole (PRILOSEC) 20 MG capsule Take 20 mg by mouth daily.    . QUEtiapine (SEROQUEL) 25 MG tablet Take 25 mg by mouth 2 (two) times daily.      . QUEtiapine (SEROQUEL) 300 MG tablet Take 300 mg by mouth at bedtime.      . traMADol (ULTRAM) 50 MG tablet Take 50 mg by mouth every 6 (six) hours as needed. For pain     No current facility-administered medications on file prior to visit.   No Known Allergies  No results found for this or any previous visit (from the past 2160 hour(s)).  Objective: There were no vitals filed for this visit.  General: Patient is awake, alert, oriented x 3 and in no acute distress.  Dermatology: Skin is warm and dry bilateral with a Partial thickness ulceration present  Left medial hallux. Ulceration measures 0.1 cm x 0.1 cm x 0.1 cm. There is a  Keratotic border with a granular base. The ulceration does not  probe to bone. There is no malodor, no active drainage, no erythema, no edema. No acute signs of infection.   Vascular: Dorsalis Pedis pulse = 2/4 Bilateral,  Posterior Tibial pulse = 2/4 Bilateral,  Capillary Fill Time < 5 seconds  Neurologic: Protective sensation diminished using the 5.07/10g Morgan StanleySemmes Weinstein Monofilament.  Musculosketal:  No Pain with palpation to ulcerated area. No pain to left hallux clinical improvement fracture. No pain  with compression to calves bilateral.   Assessment and Plan:  Problem List Items Addressed This Visit    None    Visit Diagnoses    Diabetic ulcer of left foot associated with type 2 diabetes mellitus (HCC)    -  Primary    Medial hallux    Left foot pain        Toe fracture, left, with routine healing, subsequent encounter        Improved      -Examined patient and discussed the progression of the wound and treatment alternatives. -Xrays reviewed -Excisionally dedbrided ulceration at  Left hallux to healthy bleeding borders using a sterile chisel blade. -Applied topical antibiotic and dry sterile dressing and instructed patient to continue with daily dressings at home consisting of same - Advised patient to go to the ER or return to office if the wound worsens or if constitutional symptoms are present. -Patient to return to office pick up diabetic shoes or sooner if problems arise. Will consider repeat x-ray based on symptoms and wound healing at next encounter  Asencion Islam, DPM

## 2016-02-18 DIAGNOSIS — I10 Essential (primary) hypertension: Secondary | ICD-10-CM | POA: Diagnosis not present

## 2016-02-18 DIAGNOSIS — E1140 Type 2 diabetes mellitus with diabetic neuropathy, unspecified: Secondary | ICD-10-CM | POA: Diagnosis not present

## 2016-02-18 DIAGNOSIS — K219 Gastro-esophageal reflux disease without esophagitis: Secondary | ICD-10-CM | POA: Diagnosis not present

## 2016-02-18 DIAGNOSIS — E7800 Pure hypercholesterolemia, unspecified: Secondary | ICD-10-CM | POA: Diagnosis not present

## 2016-02-18 DIAGNOSIS — J449 Chronic obstructive pulmonary disease, unspecified: Secondary | ICD-10-CM | POA: Diagnosis not present

## 2016-03-11 ENCOUNTER — Ambulatory Visit: Payer: Medicare Other | Admitting: Sports Medicine

## 2016-03-18 ENCOUNTER — Other Ambulatory Visit: Payer: Medicare Other

## 2016-03-26 NOTE — Progress Notes (Signed)
Patient ID: [name] Jeffersonmy C Iodice, female   DOB: 04-Jan-1961, 55 y.o.   MRN: [id number] Patient presents to be measured for diabetic shoes and inserts with Betha CPed.  We will call when shoes and inserts arrive

## 2016-04-15 ENCOUNTER — Other Ambulatory Visit: Payer: Medicare Other

## 2016-05-07 ENCOUNTER — Encounter: Payer: Self-pay | Admitting: Sports Medicine

## 2016-05-07 ENCOUNTER — Ambulatory Visit (INDEPENDENT_AMBULATORY_CARE_PROVIDER_SITE_OTHER): Payer: Medicare Other | Admitting: Sports Medicine

## 2016-05-07 ENCOUNTER — Ambulatory Visit (INDEPENDENT_AMBULATORY_CARE_PROVIDER_SITE_OTHER): Payer: Medicare Other

## 2016-05-07 ENCOUNTER — Encounter (INDEPENDENT_AMBULATORY_CARE_PROVIDER_SITE_OTHER): Payer: Self-pay

## 2016-05-07 DIAGNOSIS — E11621 Type 2 diabetes mellitus with foot ulcer: Secondary | ICD-10-CM

## 2016-05-07 DIAGNOSIS — S92912D Unspecified fracture of left toe(s), subsequent encounter for fracture with routine healing: Secondary | ICD-10-CM

## 2016-05-07 DIAGNOSIS — M79672 Pain in left foot: Secondary | ICD-10-CM

## 2016-05-07 DIAGNOSIS — S92912A Unspecified fracture of left toe(s), initial encounter for closed fracture: Secondary | ICD-10-CM

## 2016-05-07 DIAGNOSIS — R609 Edema, unspecified: Secondary | ICD-10-CM

## 2016-05-07 DIAGNOSIS — L97529 Non-pressure chronic ulcer of other part of left foot with unspecified severity: Secondary | ICD-10-CM

## 2016-05-07 NOTE — Progress Notes (Signed)
Patient ID: PETINA REYNEN, female   DOB: [birth year], 55 y.o.   MRN: [id number]  Subjective: Shulamit TONYIA REICHLIN is a 55 y.o. female patient seen in office for evaluation of ulceration of the left big toe. Patient has a history of diabetes and a blood glucose level today of "not great"per patient A1c has increased to 7.6. Patient states that the callus area has dry blood underneath has not been dressing it and admits to worsening sharp pain throughout both feet and legs and also swelling. Patient is on Cymbalta, Lyrica, with no resolution in symptoms. Denies nausea/fever/vomiting/chills/night sweats/shortness of breath/pain. Patient has no other pedal complaints at this time.  Patient Active Problem List   Diagnosis Date Noted  . Preventative health care 04/14/2011  . Cough 03/11/2011  . DYSMETABOLIC SYNDROME 12/08/2010  . ANEMIA, B12 DEFICIENCY 10/01/2010  . ULNAR NEUROPATHY, BILATERAL 10/01/2010  . DIABETES MELLITUS, TYPE II 09/11/2010  . HYPERLIPIDEMIA 09/11/2010  . OBESITY 09/11/2010  . ANEMIA-IRON DEFICIENCY 09/11/2010  . ANXIETY 09/11/2010  . TOBACCO USE 09/11/2010  . Depressive disorder, not elsewhere classified 09/11/2010  . Unspecified essential hypertension 09/11/2010  . GERD 09/11/2010  . PEPTIC ULCER DISEASE 09/11/2010  . Headache(784.0) 09/11/2010  . COLONIC POLYPS, HX OF 09/11/2010  . Clinical depression 02/27/2010  . Diabetes mellitus (HCC) 02/27/2010  . Lumbosacral spondylosis 02/27/2010   Current Outpatient Prescriptions on File Prior to Visit  Medication Sig Dispense Refill  . DULoxetine (CYMBALTA) 60 MG capsule Take 60 mg by mouth daily.     Marland Kitchen gabapentin (NEURONTIN) 300 MG capsule Take 600 mg by mouth 2 (two) times daily.      . insulin aspart (NOVOLOG FLEXPEN) 100 UNIT/ML injection Inject 10-25 Units into the skin 3 (three) times daily before meals. As directed per sliding scale 30 mL 3  . insulin glargine (LANTUS) 100 UNIT/ML injection Inject 60 Units into the skin at  bedtime.    . metFORMIN (GLUCOPHAGE) 1000 MG tablet Take 1,000 mg by mouth 2 (two) times daily with a meal.    . omeprazole (PRILOSEC) 20 MG capsule Take 20 mg by mouth daily.    . QUEtiapine (SEROQUEL) 25 MG tablet Take 25 mg by mouth 2 (two) times daily.      . QUEtiapine (SEROQUEL) 300 MG tablet Take 300 mg by mouth at bedtime.      . traMADol (ULTRAM) 50 MG tablet Take 50 mg by mouth every 6 (six) hours as needed. For pain     No current facility-administered medications on file prior to visit.    No Known Allergies  No results found for this or any previous visit (from the past 2160 hour(s)).  Objective: There were no vitals filed for this visit.  General: Patient is awake, alert, oriented x 3 and in no acute distress.  Dermatology: Skin is warm and dry bilateral with a Partial thickness ulceration present  Left medial hallux. Ulceration measures 0.1 cm x 0.2 cm x 0.1 cm (last measurement 0.1 cm x 0.1 cm x 0.1 cm). There is a Keratotic border with a granular base. The ulceration does not probe to bone. There is no malodor, no active drainage, no erythema, no edema. No acute signs of infection.   Vascular: Dorsalis Pedis pulse = 2/4 Bilateral,  Posterior Tibial pulse = 2/4 Bilateral,  Capillary Fill Time < 5 seconds. Mild focal trace edema to both legs and ankles.  Neurologic: Protective sensation diminished using the 5.07/10g [employer].  Musculosketal:  No Pain with  palpation to ulcerated area. No pain to left hallux. No pain with compression to calves bilateral.   X-rays left foot. No signs of soft tissue emphysema or osteomyelitis at left hallux ulceration site, Old healed fracture with signs of arthritis left hallux IPJ and base of third metatarsal without dislocation. There is midfoot collapse and arthritis and inferior calcaneal spur. Soft tissue margins within normal limits. No foreign body.    Assessment and Plan:  Problem List Items Addressed This  Visit    None    Visit Diagnoses    Diabetic ulcer of left foot associated with type 2 diabetes mellitus (HCC)    -  Primary   Relevant Orders   DG Foot Complete Left   Left foot pain       Relevant Orders   DG Foot Complete Left   Toe fracture, left, with routine healing, subsequent encounter       healed with arthritis   Relevant Orders   DG Foot Complete Left   Swelling         -Examined patient and discussed the progression of the wound and treatment alternatives.Ulceration is likely secondary to history of fracture with hallux arthritis/position.  -Xrays reviewed -Excisionally dedbrided ulceration at Left hallux to healthy bleeding borders using a sterile chisel blade. -Applied topical antibiotic and dry sterile dressing and instructed patient to continue with daily dressings at home consisting of same with using offloading toe pad as dispense - Advised patient to go to the ER or return to office if the wound worsens or if constitutional symptoms are present. -Patient to return to office for recheck of ulceration in 4 weeks and for pick up diabetic shoes when available or sooner if problems arise.   Asencion Islam, DPM

## 2016-06-03 DIAGNOSIS — J449 Chronic obstructive pulmonary disease, unspecified: Secondary | ICD-10-CM | POA: Diagnosis not present

## 2016-06-03 DIAGNOSIS — E612 Magnesium deficiency: Secondary | ICD-10-CM | POA: Diagnosis not present

## 2016-06-03 DIAGNOSIS — E1140 Type 2 diabetes mellitus with diabetic neuropathy, unspecified: Secondary | ICD-10-CM | POA: Diagnosis not present

## 2016-06-03 DIAGNOSIS — J0100 Acute maxillary sinusitis, unspecified: Secondary | ICD-10-CM | POA: Diagnosis not present

## 2016-06-03 DIAGNOSIS — E7800 Pure hypercholesterolemia, unspecified: Secondary | ICD-10-CM | POA: Diagnosis not present

## 2016-06-03 DIAGNOSIS — I10 Essential (primary) hypertension: Secondary | ICD-10-CM | POA: Diagnosis not present

## 2016-06-04 ENCOUNTER — Ambulatory Visit: Payer: Medicare Other | Admitting: Sports Medicine

## 2016-06-10 ENCOUNTER — Ambulatory Visit: Payer: Medicare Other | Admitting: Sports Medicine

## 2016-06-17 DIAGNOSIS — Z79899 Other long term (current) drug therapy: Secondary | ICD-10-CM | POA: Diagnosis not present

## 2016-06-17 DIAGNOSIS — R609 Edema, unspecified: Secondary | ICD-10-CM | POA: Diagnosis not present

## 2016-06-17 DIAGNOSIS — M150 Primary generalized (osteo)arthritis: Secondary | ICD-10-CM | POA: Diagnosis not present

## 2016-07-02 ENCOUNTER — Ambulatory Visit (INDEPENDENT_AMBULATORY_CARE_PROVIDER_SITE_OTHER): Payer: Medicare Other | Admitting: Sports Medicine

## 2016-07-02 ENCOUNTER — Encounter: Payer: Self-pay | Admitting: Sports Medicine

## 2016-07-02 VITALS — Resp 18 | Ht 66.0 in | Wt 343.0 lb

## 2016-07-02 DIAGNOSIS — E1142 Type 2 diabetes mellitus with diabetic polyneuropathy: Secondary | ICD-10-CM

## 2016-07-02 DIAGNOSIS — E11621 Type 2 diabetes mellitus with foot ulcer: Secondary | ICD-10-CM

## 2016-07-02 DIAGNOSIS — S92912D Unspecified fracture of left toe(s), subsequent encounter for fracture with routine healing: Secondary | ICD-10-CM

## 2016-07-02 DIAGNOSIS — L97529 Non-pressure chronic ulcer of other part of left foot with unspecified severity: Secondary | ICD-10-CM

## 2016-07-02 DIAGNOSIS — B351 Tinea unguium: Secondary | ICD-10-CM

## 2016-07-02 DIAGNOSIS — M79672 Pain in left foot: Secondary | ICD-10-CM

## 2016-07-02 NOTE — Progress Notes (Signed)
Patient ID: [name], female   DOB: [birth year], 55 y.o.   MRN: [id number]  Subjective: [name] is a 55 y.o. female patient seen in office for evaluation of ulceration of the left big toe. Patient has a history of diabetes and a blood glucose level average 180-190, per patient A1c 7. Patient states that seems like her ulceration is getting bigger. Reports that she wasn't able to make her last appointment. Denies nausea/fever/vomiting/chills/night sweats/shortness of breath/pain. Patient also desires nails to be trimmed and is here to pick up diabetic shoes. Patient has no other pedal complaints at this time.  Patient Active Problem List   Diagnosis Date Noted  . Preventative health care 04/14/2011  . Cough 03/11/2011  . DYSMETABOLIC SYNDROME 12/08/2010  . ANEMIA, B12 DEFICIENCY 10/01/2010  . ULNAR NEUROPATHY, BILATERAL 10/01/2010  . DIABETES MELLITUS, TYPE II 09/11/2010  . HYPERLIPIDEMIA 09/11/2010  . OBESITY 09/11/2010  . ANEMIA-IRON DEFICIENCY 09/11/2010  . ANXIETY 09/11/2010  . TOBACCO USE 09/11/2010  . Depressive disorder, not elsewhere classified 09/11/2010  . Unspecified essential hypertension 09/11/2010  . GERD 09/11/2010  . PEPTIC ULCER DISEASE 09/11/2010  . Headache(784.0) 09/11/2010  . COLONIC POLYPS, HX OF 09/11/2010  . Clinical depression 02/27/2010  . Diabetes mellitus (HCC) 02/27/2010  . Lumbosacral spondylosis 02/27/2010   Current Outpatient Prescriptions on File Prior to Visit  Medication Sig Dispense Refill  . DULoxetine (CYMBALTA) 60 MG capsule Take 60 mg by mouth daily.     Marland Kitchen gabapentin (NEURONTIN) 300 MG capsule Take 600 mg by mouth 2 (two) times daily.      . insulin aspart (NOVOLOG FLEXPEN) 100 UNIT/ML injection Inject 10-25 Units into the skin 3 (three) times daily before meals. As directed per sliding scale 30 mL 3  . insulin glargine (LANTUS) 100 UNIT/ML injection Inject 60 Units into the skin at bedtime.    . metFORMIN (GLUCOPHAGE) 1000 MG tablet  Take 1,000 mg by mouth 2 (two) times daily with a meal.    . omeprazole (PRILOSEC) 20 MG capsule Take 20 mg by mouth daily.    . QUEtiapine (SEROQUEL) 25 MG tablet Take 25 mg by mouth 2 (two) times daily.      . QUEtiapine (SEROQUEL) 300 MG tablet Take 300 mg by mouth at bedtime.      . traMADol (ULTRAM) 50 MG tablet Take 50 mg by mouth every 6 (six) hours as needed. For pain     No current facility-administered medications on file prior to visit.    No Known Allergies  No results found for this or any previous visit (from the past 2160 hour(s)).  Objective: There were no vitals filed for this visit.  General: Patient is awake, alert, oriented x 3 and in no acute distress.  Dermatology: Skin is warm and dry bilateral with a Partial thickness ulceration present Left medial hallux. Ulceration measures 0.8 cm x 0.5 cm x 0.1 cm (last measurement 0.1 cm x 0.2 cm x 0.1 cm). There is a Keratotic border with a granular base. The ulceration does not probe to bone. There is no malodor, no active drainage, no erythema, no edema. No acute signs of infection. Nails x 10 are thickened and elongated.   Vascular: Dorsalis Pedis pulse = 2/4 Bilateral,  Posterior Tibial pulse = 2/4 Bilateral,  Capillary Fill Time < 5 seconds. Mild focal trace edema to both legs and ankles.  Neurologic: Protective sensation diminished using the 5.07/10g [employer].  Musculosketal:  No Pain with palpation to  ulcerated area. No pain to left hallux. No pain with compression to calves bilateral.    Assessment and Plan:  Problem List Items Addressed This Visit    None    Visit Diagnoses    Diabetic ulcer of left foot associated with type 2 diabetes mellitus (HCC)    -  Primary   Left foot pain       Toe fracture, left, with routine healing, subsequent encounter       Diabetic polyneuropathy associated with type 2 diabetes mellitus (HCC)       Dermatophytosis of nail         -Examined patient  -Nails  were debrided using sterile nail nipper without incident -Discussed the progression of the wound and treatment alternatives.Ulceration is likely secondary to history of fracture with hallux arthritis/position and excessive pressure and failure to dressing wound as instructed.  -Excisionally dedbrided ulceration at Left hallux to healthy bleeding borders using a sterile chisel blade. -Applied PRISMA AG and offloading pad covered with dry sterile dressing and instructed patient to continue with daily dressings at home consisting of same  - Advised patient to go to the ER or return to office if the wound worsens or if constitutional symptoms are present. -Diabetic shoes were dispensed to patient with break in period and wear instructions explained -Patient to return to office for recheck of ulceration in 2 weeks if problems arise.   Asencion Islamitorya Margorie Renner, DPM

## 2016-07-16 ENCOUNTER — Ambulatory Visit (INDEPENDENT_AMBULATORY_CARE_PROVIDER_SITE_OTHER): Payer: Medicare Other | Admitting: Sports Medicine

## 2016-07-16 ENCOUNTER — Encounter: Payer: Self-pay | Admitting: Sports Medicine

## 2016-07-16 DIAGNOSIS — E11621 Type 2 diabetes mellitus with foot ulcer: Secondary | ICD-10-CM | POA: Diagnosis not present

## 2016-07-16 DIAGNOSIS — L89891 Pressure ulcer of other site, stage 1: Secondary | ICD-10-CM | POA: Diagnosis not present

## 2016-07-16 DIAGNOSIS — L97521 Non-pressure chronic ulcer of other part of left foot limited to breakdown of skin: Principal | ICD-10-CM

## 2016-07-16 DIAGNOSIS — E1142 Type 2 diabetes mellitus with diabetic polyneuropathy: Secondary | ICD-10-CM

## 2016-07-16 DIAGNOSIS — M79672 Pain in left foot: Secondary | ICD-10-CM

## 2016-07-16 NOTE — Progress Notes (Signed)
Patient ID: [name], female   DOB: [birth year], 55 y.o.   MRN: [id number]  Subjective: [name] is a 55 y.o. female patient seen in office for evaluation of ulceration of the left big toe. Patient has a history of diabetes and a blood glucose level average 185. Patient states that seems like her ulceration is getting better, using primsa with no issues. Denies nausea/fever/vomiting/chills/night sweats/shortness of breath/pain. No issues with diabetic shoes. Patient has no other pedal complaints at this time.  Patient Active Problem List   Diagnosis Date Noted  . Preventative health care 04/14/2011  . Cough 03/11/2011  . DYSMETABOLIC SYNDROME 12/08/2010  . ANEMIA, B12 DEFICIENCY 10/01/2010  . ULNAR NEUROPATHY, BILATERAL 10/01/2010  . DIABETES MELLITUS, TYPE II 09/11/2010  . HYPERLIPIDEMIA 09/11/2010  . OBESITY 09/11/2010  . ANEMIA-IRON DEFICIENCY 09/11/2010  . ANXIETY 09/11/2010  . TOBACCO USE 09/11/2010  . Depressive disorder, not elsewhere classified 09/11/2010  . Unspecified essential hypertension 09/11/2010  . GERD 09/11/2010  . PEPTIC ULCER DISEASE 09/11/2010  . Headache(784.0) 09/11/2010  . COLONIC POLYPS, HX OF 09/11/2010  . Clinical depression 02/27/2010  . Diabetes mellitus (HCC) 02/27/2010  . Lumbosacral spondylosis 02/27/2010   Current Outpatient Prescriptions on File Prior to Visit  Medication Sig Dispense Refill  . DULoxetine (CYMBALTA) 60 MG capsule Take 60 mg by mouth daily.     Marland Kitchen. gabapentin (NEURONTIN) 300 MG capsule Take 600 mg by mouth 2 (two) times daily.      . insulin aspart (NOVOLOG FLEXPEN) 100 UNIT/ML injection Inject 10-25 Units into the skin 3 (three) times daily before meals. As directed per sliding scale 30 mL 3  . insulin glargine (LANTUS) 100 UNIT/ML injection Inject 60 Units into the skin at bedtime.    . metFORMIN (GLUCOPHAGE) 1000 MG tablet Take 1,000 mg by mouth 2 (two) times daily with a meal.    . omeprazole (PRILOSEC) 20 MG capsule Take  20 mg by mouth daily.    . QUEtiapine (SEROQUEL) 25 MG tablet Take 25 mg by mouth 2 (two) times daily.      . QUEtiapine (SEROQUEL) 300 MG tablet Take 300 mg by mouth at bedtime.      . traMADol (ULTRAM) 50 MG tablet Take 50 mg by mouth every 6 (six) hours as needed. For pain     No current facility-administered medications on file prior to visit.    No Known Allergies  No results found for this or any previous visit (from the past 2160 hour(s)).  Objective: There were no vitals filed for this visit.  General: Patient is awake, alert, oriented x 3 and in no acute distress.  Dermatology: Skin is warm and dry bilateral with a Partial thickness ulceration present Left medial hallux. Ulceration measures 0.5 cm x 0.5 cm x 0.1 cm (last measurement0.8 cm x 0.5 cm x 0.1 cm). There is a Keratotic border with a granular base. The ulceration does not probe to bone. There is no malodor, no active drainage, no erythema, no edema. No acute signs of infection. Nails x 10 are thickened and short.   Vascular: Dorsalis Pedis pulse = 2/4 Bilateral,  Posterior Tibial pulse = 2/4 Bilateral,  Capillary Fill Time < 5 seconds. Mild focal trace edema to both legs and ankles.  Neurologic: Protective sensation diminished using the 5.07/10g [employer] Monofilament.  Musculosketal:  No Pain with palpation to ulcerated area. No pain to left hallux. No pain with compression to calves bilateral.    Assessment and Plan:  Problem List Items Addressed This Visit    None    Visit Diagnoses    Diabetic ulcer of left foot associated with type 2 diabetes mellitus, limited to breakdown of skin, unspecified part of foot (HCC)    -  Primary   Diabetic polyneuropathy associated with type 2 diabetes mellitus (HCC)       Left foot pain         -Examined patient  -Discussed the progression of the wound and treatment alternatives. -Excisionally dedbrided ulceration at Left hallux to healthy bleeding borders using a  sterile chisel blade. -Applied PRISMA AG and offloading pad covered with dry sterile dressing and instructed patient to continue with every other day dressings at home consisting of same  - Advised patient to go to the ER or return to office if the wound worsens or if constitutional symptoms are present. -Continue with diabetic shoes. -Patient to return to office for recheck of ulceration in 2-3 weeks if problems arise.   Asencion Islam, DPM

## 2016-07-27 DIAGNOSIS — E11621 Type 2 diabetes mellitus with foot ulcer: Secondary | ICD-10-CM | POA: Diagnosis not present

## 2016-08-13 ENCOUNTER — Encounter: Payer: Self-pay | Admitting: Sports Medicine

## 2016-08-13 ENCOUNTER — Ambulatory Visit (INDEPENDENT_AMBULATORY_CARE_PROVIDER_SITE_OTHER): Payer: Medicare Other | Admitting: Sports Medicine

## 2016-08-13 DIAGNOSIS — M79672 Pain in left foot: Secondary | ICD-10-CM

## 2016-08-13 DIAGNOSIS — E11621 Type 2 diabetes mellitus with foot ulcer: Secondary | ICD-10-CM | POA: Diagnosis not present

## 2016-08-13 DIAGNOSIS — S90412A Abrasion, left great toe, initial encounter: Secondary | ICD-10-CM

## 2016-08-13 DIAGNOSIS — L97521 Non-pressure chronic ulcer of other part of left foot limited to breakdown of skin: Secondary | ICD-10-CM

## 2016-08-13 DIAGNOSIS — E1142 Type 2 diabetes mellitus with diabetic polyneuropathy: Secondary | ICD-10-CM

## 2016-08-13 NOTE — Progress Notes (Signed)
Patient ID: [name] C [place], female   DOB: [birth year], 55 y.o.   MRN: [id number]  Subjective: [name] Pollie FriarC Patella is a 55 y.o. female patient seen in office for evaluation of ulceration of the left big toe. Patient has a history of diabetes and a blood glucose level average 185, same as previous. Patient states that seems like her ulceration is getting better, using primsa with no issues. Denies nausea/fever/vomiting/chills/night sweats/shortness of breath/pain. No issues with diabetic shoes. Patient has no other pedal complaints at this time.  Patient Active Problem List   Diagnosis Date Noted  . Preventative health care 04/14/2011  . Cough 03/11/2011  . DYSMETABOLIC SYNDROME 12/08/2010  . ANEMIA, B12 DEFICIENCY 10/01/2010  . ULNAR NEUROPATHY, BILATERAL 10/01/2010  . DIABETES MELLITUS, TYPE II 09/11/2010  . HYPERLIPIDEMIA 09/11/2010  . OBESITY 09/11/2010  . ANEMIA-IRON DEFICIENCY 09/11/2010  . ANXIETY 09/11/2010  . TOBACCO USE 09/11/2010  . Depressive disorder, not elsewhere classified 09/11/2010  . Unspecified essential hypertension 09/11/2010  . GERD 09/11/2010  . PEPTIC ULCER DISEASE 09/11/2010  . Headache(784.0) 09/11/2010  . COLONIC POLYPS, HX OF 09/11/2010  . Clinical depression 02/27/2010  . Diabetes mellitus (HCC) 02/27/2010  . Lumbosacral spondylosis 02/27/2010   Current Outpatient Prescriptions on File Prior to Visit  Medication Sig Dispense Refill  . DULoxetine (CYMBALTA) 60 MG capsule Take 60 mg by mouth daily.     Marland Kitchen. gabapentin (NEURONTIN) 300 MG capsule Take 600 mg by mouth 2 (two) times daily.      . insulin aspart (NOVOLOG FLEXPEN) 100 UNIT/ML injection Inject 10-25 Units into the skin 3 (three) times daily before meals. As directed per sliding scale 30 mL 3  . insulin glargine (LANTUS) 100 UNIT/ML injection Inject 60 Units into the skin at bedtime.    . metFORMIN (GLUCOPHAGE) 1000 MG tablet Take 1,000 mg by mouth 2 (two) times daily with a meal.    . omeprazole (PRILOSEC)  20 MG capsule Take 20 mg by mouth daily.    . QUEtiapine (SEROQUEL) 25 MG tablet Take 25 mg by mouth 2 (two) times daily.      . QUEtiapine (SEROQUEL) 300 MG tablet Take 300 mg by mouth at bedtime.      . traMADol (ULTRAM) 50 MG tablet Take 50 mg by mouth every 6 (six) hours as needed. For pain     No current facility-administered medications on file prior to visit.    No Known Allergies  No results found for this or any previous visit (from the past 2160 hour(s)).  Objective: There were no vitals filed for this visit.  General: Patient is awake, alert, oriented x 3 and in no acute distress.  Dermatology: Skin is warm and dry bilateral with a Partial thickness ulceration present Left medial hallux. Ulceration measures 0.3 cm x 0.3 cm x 0.1 cm (last measurement 0.5 cm x 0.5 cm x 0.1 cm). There is a Keratotic border with a granular base. The ulceration does not probe to bone. There is no malodor, no active drainage, no erythema, no edema. No acute signs of infection. There is a new abrasion at top of hallux secondary to cutting self with bandage scissors. Nails x 10 are thickened and short.   Vascular: Dorsalis Pedis pulse = 2/4 Bilateral,  Posterior Tibial pulse = 2/4 Bilateral,  Capillary Fill Time < 5 seconds. Mild focal trace edema to both legs and ankles.  Neurologic: Protective sensation diminished using the 5.07/10g [employer] Monofilament.  Musculosketal:  No Pain with palpation to  ulcerated area. No pain to left hallux. No pain with compression to calves bilateral.    Assessment and Plan:  Problem List Items Addressed This Visit    None    Visit Diagnoses    Diabetic ulcer of left foot associated with type 2 diabetes mellitus, limited to breakdown of skin, unspecified part of foot (HCC)    -  Primary   Diabetic polyneuropathy associated with type 2 diabetes mellitus (HCC)       Left foot pain       Abrasion of left great toe, initial encounter         -Examined  patient  -Discussed the progression of the wound and treatment alternatives. -Excisionally dedbrided ulceration at Left hallux to healthy bleeding borders using a sterile chisel blade. -Applied PRISMA AG to dorsal and plantar surface and offloading pad covered with dry sterile dressing and instructed patient to continue with every other day dressings at home consisting of same  - Advised patient to go to the ER or return to office if the wound worsens or if constitutional symptoms are present. -Continue with diabetic shoes. -Patient to return to office for re-check of ulceration in 3 weeks if problems arise.   [name], DPM

## 2016-09-09 ENCOUNTER — Ambulatory Visit: Payer: Medicare Other | Admitting: Sports Medicine

## 2016-09-15 ENCOUNTER — Telehealth: Payer: Self-pay | Admitting: Sports Medicine

## 2016-09-15 NOTE — Telephone Encounter (Signed)
Pt called about the office getting a care package with bandages and things for her. She needs the pads and tape. Please call pt tomorrow

## 2016-09-16 NOTE — Telephone Encounter (Signed)
I will call the pt back

## 2016-09-16 NOTE — Telephone Encounter (Signed)
Patient called back wanting to speak to the [place]. She is saying she only needs the pads and the tape in the care package. I explained to the patient that the [place] office is closed on Tuesdays. Patient requested a call back today.

## 2016-09-23 ENCOUNTER — Ambulatory Visit: Payer: Medicare Other | Admitting: Sports Medicine

## 2016-10-07 ENCOUNTER — Ambulatory Visit: Payer: Medicare Other | Admitting: Sports Medicine

## 2016-10-08 NOTE — Telephone Encounter (Signed)
error 

## 2016-10-09 ENCOUNTER — Ambulatory Visit: Payer: Medicare Other | Admitting: Sports Medicine

## 2016-10-28 DIAGNOSIS — Z79899 Other long term (current) drug therapy: Secondary | ICD-10-CM | POA: Diagnosis not present

## 2016-10-28 DIAGNOSIS — M150 Primary generalized (osteo)arthritis: Secondary | ICD-10-CM | POA: Diagnosis not present

## 2016-11-11 DIAGNOSIS — E7800 Pure hypercholesterolemia, unspecified: Secondary | ICD-10-CM | POA: Diagnosis not present

## 2016-11-11 DIAGNOSIS — E612 Magnesium deficiency: Secondary | ICD-10-CM | POA: Diagnosis not present

## 2016-11-11 DIAGNOSIS — Z1389 Encounter for screening for other disorder: Secondary | ICD-10-CM | POA: Diagnosis not present

## 2016-11-11 DIAGNOSIS — K219 Gastro-esophageal reflux disease without esophagitis: Secondary | ICD-10-CM | POA: Diagnosis not present

## 2016-11-11 DIAGNOSIS — I10 Essential (primary) hypertension: Secondary | ICD-10-CM | POA: Diagnosis not present

## 2016-11-11 DIAGNOSIS — E1140 Type 2 diabetes mellitus with diabetic neuropathy, unspecified: Secondary | ICD-10-CM | POA: Diagnosis not present

## 2016-11-27 ENCOUNTER — Ambulatory Visit: Payer: Medicare Other | Admitting: Sports Medicine

## 2016-11-27 DIAGNOSIS — L03032 Cellulitis of left toe: Secondary | ICD-10-CM | POA: Diagnosis not present

## 2016-11-27 DIAGNOSIS — E11621 Type 2 diabetes mellitus with foot ulcer: Secondary | ICD-10-CM | POA: Diagnosis not present

## 2016-11-27 DIAGNOSIS — M7989 Other specified soft tissue disorders: Secondary | ICD-10-CM | POA: Diagnosis not present

## 2016-11-27 DIAGNOSIS — L97521 Non-pressure chronic ulcer of other part of left foot limited to breakdown of skin: Secondary | ICD-10-CM | POA: Diagnosis not present

## 2016-12-03 DIAGNOSIS — I10 Essential (primary) hypertension: Secondary | ICD-10-CM | POA: Diagnosis not present

## 2016-12-03 DIAGNOSIS — L97522 Non-pressure chronic ulcer of other part of left foot with fat layer exposed: Secondary | ICD-10-CM | POA: Diagnosis not present

## 2016-12-03 DIAGNOSIS — L97509 Non-pressure chronic ulcer of other part of unspecified foot with unspecified severity: Secondary | ICD-10-CM | POA: Diagnosis not present

## 2016-12-03 DIAGNOSIS — L97521 Non-pressure chronic ulcer of other part of left foot limited to breakdown of skin: Secondary | ICD-10-CM | POA: Diagnosis not present

## 2016-12-03 DIAGNOSIS — E11621 Type 2 diabetes mellitus with foot ulcer: Secondary | ICD-10-CM | POA: Diagnosis not present

## 2016-12-03 DIAGNOSIS — G4730 Sleep apnea, unspecified: Secondary | ICD-10-CM | POA: Diagnosis not present

## 2016-12-03 DIAGNOSIS — J449 Chronic obstructive pulmonary disease, unspecified: Secondary | ICD-10-CM | POA: Diagnosis not present

## 2016-12-03 DIAGNOSIS — E1140 Type 2 diabetes mellitus with diabetic neuropathy, unspecified: Secondary | ICD-10-CM | POA: Diagnosis not present

## 2016-12-09 DIAGNOSIS — M79662 Pain in left lower leg: Secondary | ICD-10-CM | POA: Diagnosis not present

## 2016-12-09 DIAGNOSIS — M79661 Pain in right lower leg: Secondary | ICD-10-CM | POA: Diagnosis not present

## 2016-12-09 DIAGNOSIS — L97529 Non-pressure chronic ulcer of other part of left foot with unspecified severity: Secondary | ICD-10-CM | POA: Diagnosis not present

## 2016-12-09 DIAGNOSIS — R609 Edema, unspecified: Secondary | ICD-10-CM | POA: Diagnosis not present

## 2016-12-09 DIAGNOSIS — E11621 Type 2 diabetes mellitus with foot ulcer: Secondary | ICD-10-CM | POA: Diagnosis not present

## 2016-12-10 DIAGNOSIS — E11621 Type 2 diabetes mellitus with foot ulcer: Secondary | ICD-10-CM | POA: Diagnosis not present

## 2016-12-10 DIAGNOSIS — L97521 Non-pressure chronic ulcer of other part of left foot limited to breakdown of skin: Secondary | ICD-10-CM | POA: Diagnosis not present

## 2016-12-10 DIAGNOSIS — M8618 Other acute osteomyelitis, other site: Secondary | ICD-10-CM | POA: Diagnosis not present

## 2016-12-10 DIAGNOSIS — M868X7 Other osteomyelitis, ankle and foot: Secondary | ICD-10-CM | POA: Diagnosis not present

## 2016-12-10 DIAGNOSIS — L97522 Non-pressure chronic ulcer of other part of left foot with fat layer exposed: Secondary | ICD-10-CM | POA: Diagnosis not present

## 2016-12-14 DIAGNOSIS — E11621 Type 2 diabetes mellitus with foot ulcer: Secondary | ICD-10-CM | POA: Diagnosis not present

## 2016-12-14 DIAGNOSIS — L97529 Non-pressure chronic ulcer of other part of left foot with unspecified severity: Secondary | ICD-10-CM | POA: Diagnosis not present

## 2016-12-18 DIAGNOSIS — K760 Fatty (change of) liver, not elsewhere classified: Secondary | ICD-10-CM | POA: Diagnosis not present

## 2016-12-18 DIAGNOSIS — E11621 Type 2 diabetes mellitus with foot ulcer: Secondary | ICD-10-CM | POA: Diagnosis not present

## 2016-12-24 DIAGNOSIS — L97522 Non-pressure chronic ulcer of other part of left foot with fat layer exposed: Secondary | ICD-10-CM | POA: Diagnosis not present

## 2016-12-24 DIAGNOSIS — E11621 Type 2 diabetes mellitus with foot ulcer: Secondary | ICD-10-CM | POA: Diagnosis not present

## 2016-12-24 DIAGNOSIS — L97521 Non-pressure chronic ulcer of other part of left foot limited to breakdown of skin: Secondary | ICD-10-CM | POA: Diagnosis not present

## 2017-01-01 DIAGNOSIS — L97509 Non-pressure chronic ulcer of other part of unspecified foot with unspecified severity: Secondary | ICD-10-CM | POA: Diagnosis not present

## 2017-01-04 DIAGNOSIS — E11621 Type 2 diabetes mellitus with foot ulcer: Secondary | ICD-10-CM | POA: Diagnosis not present

## 2017-01-07 DIAGNOSIS — L97521 Non-pressure chronic ulcer of other part of left foot limited to breakdown of skin: Secondary | ICD-10-CM | POA: Diagnosis not present

## 2017-01-07 DIAGNOSIS — E11621 Type 2 diabetes mellitus with foot ulcer: Secondary | ICD-10-CM | POA: Diagnosis not present

## 2017-01-07 DIAGNOSIS — L97522 Non-pressure chronic ulcer of other part of left foot with fat layer exposed: Secondary | ICD-10-CM | POA: Diagnosis not present

## 2017-01-14 DIAGNOSIS — L97521 Non-pressure chronic ulcer of other part of left foot limited to breakdown of skin: Secondary | ICD-10-CM | POA: Diagnosis not present

## 2017-01-14 DIAGNOSIS — M869 Osteomyelitis, unspecified: Secondary | ICD-10-CM | POA: Diagnosis not present

## 2017-01-14 DIAGNOSIS — E11621 Type 2 diabetes mellitus with foot ulcer: Secondary | ICD-10-CM | POA: Diagnosis not present

## 2017-01-14 DIAGNOSIS — L97522 Non-pressure chronic ulcer of other part of left foot with fat layer exposed: Secondary | ICD-10-CM | POA: Diagnosis not present

## 2017-01-15 DIAGNOSIS — E1165 Type 2 diabetes mellitus with hyperglycemia: Secondary | ICD-10-CM | POA: Diagnosis not present

## 2017-01-15 DIAGNOSIS — H5203 Hypermetropia, bilateral: Secondary | ICD-10-CM | POA: Diagnosis not present

## 2017-01-15 DIAGNOSIS — H52223 Regular astigmatism, bilateral: Secondary | ICD-10-CM | POA: Diagnosis not present

## 2017-01-19 DIAGNOSIS — L97521 Non-pressure chronic ulcer of other part of left foot limited to breakdown of skin: Secondary | ICD-10-CM | POA: Diagnosis not present

## 2017-01-19 DIAGNOSIS — E11621 Type 2 diabetes mellitus with foot ulcer: Secondary | ICD-10-CM | POA: Diagnosis not present

## 2017-01-20 DIAGNOSIS — L97521 Non-pressure chronic ulcer of other part of left foot limited to breakdown of skin: Secondary | ICD-10-CM | POA: Diagnosis not present

## 2017-01-20 DIAGNOSIS — E11621 Type 2 diabetes mellitus with foot ulcer: Secondary | ICD-10-CM | POA: Diagnosis not present

## 2017-01-21 DIAGNOSIS — L97929 Non-pressure chronic ulcer of unspecified part of left lower leg with unspecified severity: Secondary | ICD-10-CM | POA: Diagnosis not present

## 2017-01-21 DIAGNOSIS — L97521 Non-pressure chronic ulcer of other part of left foot limited to breakdown of skin: Secondary | ICD-10-CM | POA: Diagnosis not present

## 2017-01-21 DIAGNOSIS — H6983 Other specified disorders of Eustachian tube, bilateral: Secondary | ICD-10-CM | POA: Diagnosis not present

## 2017-01-21 DIAGNOSIS — E11621 Type 2 diabetes mellitus with foot ulcer: Secondary | ICD-10-CM | POA: Diagnosis not present

## 2017-01-21 DIAGNOSIS — L97522 Non-pressure chronic ulcer of other part of left foot with fat layer exposed: Secondary | ICD-10-CM | POA: Diagnosis not present

## 2017-01-21 DIAGNOSIS — Z9889 Other specified postprocedural states: Secondary | ICD-10-CM | POA: Diagnosis not present

## 2017-01-21 DIAGNOSIS — E119 Type 2 diabetes mellitus without complications: Secondary | ICD-10-CM | POA: Diagnosis not present

## 2017-01-26 DIAGNOSIS — H6983 Other specified disorders of Eustachian tube, bilateral: Secondary | ICD-10-CM | POA: Diagnosis not present

## 2017-01-28 DIAGNOSIS — L97521 Non-pressure chronic ulcer of other part of left foot limited to breakdown of skin: Secondary | ICD-10-CM | POA: Diagnosis not present

## 2017-01-28 DIAGNOSIS — E11621 Type 2 diabetes mellitus with foot ulcer: Secondary | ICD-10-CM | POA: Diagnosis not present

## 2017-01-28 DIAGNOSIS — L97522 Non-pressure chronic ulcer of other part of left foot with fat layer exposed: Secondary | ICD-10-CM | POA: Diagnosis not present

## 2017-02-02 DIAGNOSIS — L97521 Non-pressure chronic ulcer of other part of left foot limited to breakdown of skin: Secondary | ICD-10-CM | POA: Diagnosis not present

## 2017-02-02 DIAGNOSIS — L97522 Non-pressure chronic ulcer of other part of left foot with fat layer exposed: Secondary | ICD-10-CM | POA: Diagnosis not present

## 2017-02-02 DIAGNOSIS — E11621 Type 2 diabetes mellitus with foot ulcer: Secondary | ICD-10-CM | POA: Diagnosis not present

## 2017-02-03 DIAGNOSIS — E11621 Type 2 diabetes mellitus with foot ulcer: Secondary | ICD-10-CM | POA: Diagnosis not present

## 2017-02-03 DIAGNOSIS — L97521 Non-pressure chronic ulcer of other part of left foot limited to breakdown of skin: Secondary | ICD-10-CM | POA: Diagnosis not present

## 2017-02-04 DIAGNOSIS — L97521 Non-pressure chronic ulcer of other part of left foot limited to breakdown of skin: Secondary | ICD-10-CM | POA: Diagnosis not present

## 2017-02-04 DIAGNOSIS — E11621 Type 2 diabetes mellitus with foot ulcer: Secondary | ICD-10-CM | POA: Diagnosis not present

## 2017-02-05 DIAGNOSIS — L97521 Non-pressure chronic ulcer of other part of left foot limited to breakdown of skin: Secondary | ICD-10-CM | POA: Diagnosis not present

## 2017-02-05 DIAGNOSIS — L97522 Non-pressure chronic ulcer of other part of left foot with fat layer exposed: Secondary | ICD-10-CM | POA: Diagnosis not present

## 2017-02-05 DIAGNOSIS — E11621 Type 2 diabetes mellitus with foot ulcer: Secondary | ICD-10-CM | POA: Diagnosis not present

## 2017-02-10 DIAGNOSIS — E11621 Type 2 diabetes mellitus with foot ulcer: Secondary | ICD-10-CM | POA: Diagnosis not present

## 2017-02-10 DIAGNOSIS — L97521 Non-pressure chronic ulcer of other part of left foot limited to breakdown of skin: Secondary | ICD-10-CM | POA: Diagnosis not present

## 2017-02-11 DIAGNOSIS — E11621 Type 2 diabetes mellitus with foot ulcer: Secondary | ICD-10-CM | POA: Diagnosis not present

## 2017-02-11 DIAGNOSIS — L97521 Non-pressure chronic ulcer of other part of left foot limited to breakdown of skin: Secondary | ICD-10-CM | POA: Diagnosis not present

## 2017-02-12 DIAGNOSIS — L97522 Non-pressure chronic ulcer of other part of left foot with fat layer exposed: Secondary | ICD-10-CM | POA: Diagnosis not present

## 2017-02-12 DIAGNOSIS — E11621 Type 2 diabetes mellitus with foot ulcer: Secondary | ICD-10-CM | POA: Diagnosis not present

## 2017-02-12 DIAGNOSIS — L97521 Non-pressure chronic ulcer of other part of left foot limited to breakdown of skin: Secondary | ICD-10-CM | POA: Diagnosis not present

## 2017-02-15 DIAGNOSIS — E11621 Type 2 diabetes mellitus with foot ulcer: Secondary | ICD-10-CM | POA: Diagnosis not present

## 2017-02-15 DIAGNOSIS — L97521 Non-pressure chronic ulcer of other part of left foot limited to breakdown of skin: Secondary | ICD-10-CM | POA: Diagnosis not present

## 2017-02-17 DIAGNOSIS — Z79899 Other long term (current) drug therapy: Secondary | ICD-10-CM | POA: Diagnosis not present

## 2017-02-17 DIAGNOSIS — L03116 Cellulitis of left lower limb: Secondary | ICD-10-CM | POA: Diagnosis not present

## 2017-02-17 DIAGNOSIS — J0100 Acute maxillary sinusitis, unspecified: Secondary | ICD-10-CM | POA: Diagnosis not present

## 2017-02-19 DIAGNOSIS — H6983 Other specified disorders of Eustachian tube, bilateral: Secondary | ICD-10-CM | POA: Diagnosis not present

## 2017-02-19 DIAGNOSIS — E11621 Type 2 diabetes mellitus with foot ulcer: Secondary | ICD-10-CM | POA: Diagnosis not present

## 2017-02-19 DIAGNOSIS — L97522 Non-pressure chronic ulcer of other part of left foot with fat layer exposed: Secondary | ICD-10-CM | POA: Diagnosis not present

## 2017-02-22 DIAGNOSIS — E11621 Type 2 diabetes mellitus with foot ulcer: Secondary | ICD-10-CM | POA: Diagnosis not present

## 2017-02-22 DIAGNOSIS — L97521 Non-pressure chronic ulcer of other part of left foot limited to breakdown of skin: Secondary | ICD-10-CM | POA: Diagnosis not present

## 2017-02-23 DIAGNOSIS — E11621 Type 2 diabetes mellitus with foot ulcer: Secondary | ICD-10-CM | POA: Diagnosis not present

## 2017-02-23 DIAGNOSIS — L97521 Non-pressure chronic ulcer of other part of left foot limited to breakdown of skin: Secondary | ICD-10-CM | POA: Diagnosis not present

## 2017-03-02 DIAGNOSIS — L97521 Non-pressure chronic ulcer of other part of left foot limited to breakdown of skin: Secondary | ICD-10-CM | POA: Diagnosis not present

## 2017-03-02 DIAGNOSIS — E11621 Type 2 diabetes mellitus with foot ulcer: Secondary | ICD-10-CM | POA: Diagnosis not present

## 2017-03-03 DIAGNOSIS — E11621 Type 2 diabetes mellitus with foot ulcer: Secondary | ICD-10-CM | POA: Diagnosis not present

## 2017-03-03 DIAGNOSIS — L97521 Non-pressure chronic ulcer of other part of left foot limited to breakdown of skin: Secondary | ICD-10-CM | POA: Diagnosis not present

## 2017-03-04 DIAGNOSIS — E11621 Type 2 diabetes mellitus with foot ulcer: Secondary | ICD-10-CM | POA: Diagnosis not present

## 2017-03-04 DIAGNOSIS — L97521 Non-pressure chronic ulcer of other part of left foot limited to breakdown of skin: Secondary | ICD-10-CM | POA: Diagnosis not present

## 2017-03-05 DIAGNOSIS — L97522 Non-pressure chronic ulcer of other part of left foot with fat layer exposed: Secondary | ICD-10-CM | POA: Diagnosis not present

## 2017-03-05 DIAGNOSIS — L97521 Non-pressure chronic ulcer of other part of left foot limited to breakdown of skin: Secondary | ICD-10-CM | POA: Diagnosis not present

## 2017-03-05 DIAGNOSIS — E11621 Type 2 diabetes mellitus with foot ulcer: Secondary | ICD-10-CM | POA: Diagnosis not present

## 2017-03-11 DIAGNOSIS — E612 Magnesium deficiency: Secondary | ICD-10-CM | POA: Diagnosis not present

## 2017-03-11 DIAGNOSIS — E7800 Pure hypercholesterolemia, unspecified: Secondary | ICD-10-CM | POA: Diagnosis not present

## 2017-03-11 DIAGNOSIS — I10 Essential (primary) hypertension: Secondary | ICD-10-CM | POA: Diagnosis not present

## 2017-03-11 DIAGNOSIS — J449 Chronic obstructive pulmonary disease, unspecified: Secondary | ICD-10-CM | POA: Diagnosis not present

## 2017-03-11 DIAGNOSIS — E1140 Type 2 diabetes mellitus with diabetic neuropathy, unspecified: Secondary | ICD-10-CM | POA: Diagnosis not present

## 2017-03-26 DIAGNOSIS — L97522 Non-pressure chronic ulcer of other part of left foot with fat layer exposed: Secondary | ICD-10-CM | POA: Diagnosis not present

## 2017-03-26 DIAGNOSIS — E11621 Type 2 diabetes mellitus with foot ulcer: Secondary | ICD-10-CM | POA: Diagnosis not present

## 2017-03-26 DIAGNOSIS — L97509 Non-pressure chronic ulcer of other part of unspecified foot with unspecified severity: Secondary | ICD-10-CM | POA: Diagnosis not present

## 2017-04-09 DIAGNOSIS — L97522 Non-pressure chronic ulcer of other part of left foot with fat layer exposed: Secondary | ICD-10-CM | POA: Diagnosis not present

## 2017-04-09 DIAGNOSIS — E11621 Type 2 diabetes mellitus with foot ulcer: Secondary | ICD-10-CM | POA: Diagnosis not present

## 2017-04-16 DIAGNOSIS — L97522 Non-pressure chronic ulcer of other part of left foot with fat layer exposed: Secondary | ICD-10-CM | POA: Diagnosis not present

## 2017-04-16 DIAGNOSIS — E11621 Type 2 diabetes mellitus with foot ulcer: Secondary | ICD-10-CM | POA: Diagnosis not present

## 2017-05-04 DIAGNOSIS — L97522 Non-pressure chronic ulcer of other part of left foot with fat layer exposed: Secondary | ICD-10-CM | POA: Diagnosis not present

## 2017-05-04 DIAGNOSIS — E11621 Type 2 diabetes mellitus with foot ulcer: Secondary | ICD-10-CM | POA: Diagnosis not present

## 2017-05-11 DIAGNOSIS — J209 Acute bronchitis, unspecified: Secondary | ICD-10-CM | POA: Diagnosis not present

## 2017-05-11 DIAGNOSIS — L97522 Non-pressure chronic ulcer of other part of left foot with fat layer exposed: Secondary | ICD-10-CM | POA: Diagnosis not present

## 2017-05-11 DIAGNOSIS — J449 Chronic obstructive pulmonary disease, unspecified: Secondary | ICD-10-CM | POA: Diagnosis not present

## 2017-05-11 DIAGNOSIS — E11621 Type 2 diabetes mellitus with foot ulcer: Secondary | ICD-10-CM | POA: Diagnosis not present

## 2017-05-11 DIAGNOSIS — E1140 Type 2 diabetes mellitus with diabetic neuropathy, unspecified: Secondary | ICD-10-CM | POA: Diagnosis not present

## 2017-05-18 DIAGNOSIS — E11621 Type 2 diabetes mellitus with foot ulcer: Secondary | ICD-10-CM | POA: Diagnosis not present

## 2017-05-18 DIAGNOSIS — L97522 Non-pressure chronic ulcer of other part of left foot with fat layer exposed: Secondary | ICD-10-CM | POA: Diagnosis not present

## 2017-06-14 DIAGNOSIS — R609 Edema, unspecified: Secondary | ICD-10-CM | POA: Diagnosis not present

## 2017-06-14 DIAGNOSIS — Z79899 Other long term (current) drug therapy: Secondary | ICD-10-CM | POA: Diagnosis not present

## 2017-06-15 DIAGNOSIS — L97522 Non-pressure chronic ulcer of other part of left foot with fat layer exposed: Secondary | ICD-10-CM | POA: Diagnosis not present

## 2017-06-15 DIAGNOSIS — E11621 Type 2 diabetes mellitus with foot ulcer: Secondary | ICD-10-CM | POA: Diagnosis not present

## 2017-06-28 DIAGNOSIS — L97529 Non-pressure chronic ulcer of other part of left foot with unspecified severity: Secondary | ICD-10-CM | POA: Diagnosis not present

## 2017-06-28 DIAGNOSIS — E11621 Type 2 diabetes mellitus with foot ulcer: Secondary | ICD-10-CM | POA: Diagnosis not present

## 2017-07-08 DIAGNOSIS — Z1231 Encounter for screening mammogram for malignant neoplasm of breast: Secondary | ICD-10-CM | POA: Diagnosis not present

## 2017-10-12 DIAGNOSIS — I10 Essential (primary) hypertension: Secondary | ICD-10-CM | POA: Diagnosis not present

## 2017-10-12 DIAGNOSIS — E1140 Type 2 diabetes mellitus with diabetic neuropathy, unspecified: Secondary | ICD-10-CM | POA: Diagnosis not present

## 2017-10-12 DIAGNOSIS — E7800 Pure hypercholesterolemia, unspecified: Secondary | ICD-10-CM | POA: Diagnosis not present

## 2017-10-12 DIAGNOSIS — E612 Magnesium deficiency: Secondary | ICD-10-CM | POA: Diagnosis not present

## 2017-10-12 DIAGNOSIS — J449 Chronic obstructive pulmonary disease, unspecified: Secondary | ICD-10-CM | POA: Diagnosis not present

## 2017-10-12 DIAGNOSIS — E559 Vitamin D deficiency, unspecified: Secondary | ICD-10-CM | POA: Diagnosis not present

## 2017-10-12 DIAGNOSIS — K219 Gastro-esophageal reflux disease without esophagitis: Secondary | ICD-10-CM | POA: Diagnosis not present

## 2018-01-03 DIAGNOSIS — M62838 Other muscle spasm: Secondary | ICD-10-CM | POA: Diagnosis not present

## 2018-01-03 DIAGNOSIS — Z79899 Other long term (current) drug therapy: Secondary | ICD-10-CM | POA: Diagnosis not present

## 2018-01-12 DIAGNOSIS — R300 Dysuria: Secondary | ICD-10-CM | POA: Diagnosis not present

## 2018-01-12 DIAGNOSIS — E559 Vitamin D deficiency, unspecified: Secondary | ICD-10-CM | POA: Diagnosis not present

## 2018-01-12 DIAGNOSIS — E612 Magnesium deficiency: Secondary | ICD-10-CM | POA: Diagnosis not present

## 2018-01-12 DIAGNOSIS — E1140 Type 2 diabetes mellitus with diabetic neuropathy, unspecified: Secondary | ICD-10-CM | POA: Diagnosis not present

## 2018-01-12 DIAGNOSIS — I10 Essential (primary) hypertension: Secondary | ICD-10-CM | POA: Diagnosis not present

## 2018-01-12 DIAGNOSIS — Z1331 Encounter for screening for depression: Secondary | ICD-10-CM | POA: Diagnosis not present

## 2018-01-12 DIAGNOSIS — J449 Chronic obstructive pulmonary disease, unspecified: Secondary | ICD-10-CM | POA: Diagnosis not present

## 2018-01-12 DIAGNOSIS — E7800 Pure hypercholesterolemia, unspecified: Secondary | ICD-10-CM | POA: Diagnosis not present

## 2018-01-18 DIAGNOSIS — L97509 Non-pressure chronic ulcer of other part of unspecified foot with unspecified severity: Secondary | ICD-10-CM | POA: Diagnosis not present

## 2018-01-18 DIAGNOSIS — J449 Chronic obstructive pulmonary disease, unspecified: Secondary | ICD-10-CM | POA: Diagnosis not present

## 2018-01-18 DIAGNOSIS — M47812 Spondylosis without myelopathy or radiculopathy, cervical region: Secondary | ICD-10-CM | POA: Diagnosis not present

## 2018-01-18 DIAGNOSIS — L84 Corns and callosities: Secondary | ICD-10-CM | POA: Diagnosis not present

## 2018-01-18 DIAGNOSIS — M19041 Primary osteoarthritis, right hand: Secondary | ICD-10-CM | POA: Diagnosis not present

## 2018-01-18 DIAGNOSIS — M19042 Primary osteoarthritis, left hand: Secondary | ICD-10-CM | POA: Diagnosis not present

## 2018-01-18 DIAGNOSIS — I10 Essential (primary) hypertension: Secondary | ICD-10-CM | POA: Diagnosis not present

## 2018-01-18 DIAGNOSIS — L97522 Non-pressure chronic ulcer of other part of left foot with fat layer exposed: Secondary | ICD-10-CM | POA: Diagnosis not present

## 2018-01-18 DIAGNOSIS — Z794 Long term (current) use of insulin: Secondary | ICD-10-CM | POA: Diagnosis not present

## 2018-01-18 DIAGNOSIS — E11621 Type 2 diabetes mellitus with foot ulcer: Secondary | ICD-10-CM | POA: Diagnosis not present

## 2018-01-18 DIAGNOSIS — E1140 Type 2 diabetes mellitus with diabetic neuropathy, unspecified: Secondary | ICD-10-CM | POA: Diagnosis not present

## 2018-01-28 DIAGNOSIS — E11621 Type 2 diabetes mellitus with foot ulcer: Secondary | ICD-10-CM | POA: Diagnosis not present

## 2018-01-28 DIAGNOSIS — L97522 Non-pressure chronic ulcer of other part of left foot with fat layer exposed: Secondary | ICD-10-CM | POA: Diagnosis not present

## 2018-01-28 DIAGNOSIS — Z87891 Personal history of nicotine dependence: Secondary | ICD-10-CM | POA: Diagnosis not present

## 2018-01-28 DIAGNOSIS — D72829 Elevated white blood cell count, unspecified: Secondary | ICD-10-CM | POA: Diagnosis not present

## 2018-02-10 DIAGNOSIS — L97522 Non-pressure chronic ulcer of other part of left foot with fat layer exposed: Secondary | ICD-10-CM | POA: Diagnosis not present

## 2018-02-10 DIAGNOSIS — E11621 Type 2 diabetes mellitus with foot ulcer: Secondary | ICD-10-CM | POA: Diagnosis not present

## 2018-02-22 DIAGNOSIS — E11621 Type 2 diabetes mellitus with foot ulcer: Secondary | ICD-10-CM | POA: Diagnosis not present

## 2018-02-24 DIAGNOSIS — L84 Corns and callosities: Secondary | ICD-10-CM | POA: Diagnosis not present

## 2018-02-24 DIAGNOSIS — L97522 Non-pressure chronic ulcer of other part of left foot with fat layer exposed: Secondary | ICD-10-CM | POA: Diagnosis not present

## 2018-02-24 DIAGNOSIS — E11621 Type 2 diabetes mellitus with foot ulcer: Secondary | ICD-10-CM | POA: Diagnosis not present

## 2018-03-15 DIAGNOSIS — E11621 Type 2 diabetes mellitus with foot ulcer: Secondary | ICD-10-CM | POA: Diagnosis not present

## 2018-03-15 DIAGNOSIS — Z87891 Personal history of nicotine dependence: Secondary | ICD-10-CM | POA: Diagnosis not present

## 2018-03-15 DIAGNOSIS — L84 Corns and callosities: Secondary | ICD-10-CM | POA: Diagnosis not present

## 2018-03-15 DIAGNOSIS — L97522 Non-pressure chronic ulcer of other part of left foot with fat layer exposed: Secondary | ICD-10-CM | POA: Diagnosis not present

## 2018-03-29 DIAGNOSIS — L84 Corns and callosities: Secondary | ICD-10-CM | POA: Diagnosis not present

## 2018-03-29 DIAGNOSIS — L97522 Non-pressure chronic ulcer of other part of left foot with fat layer exposed: Secondary | ICD-10-CM | POA: Diagnosis not present

## 2018-03-29 DIAGNOSIS — E11621 Type 2 diabetes mellitus with foot ulcer: Secondary | ICD-10-CM | POA: Diagnosis not present

## 2018-04-04 DIAGNOSIS — E11621 Type 2 diabetes mellitus with foot ulcer: Secondary | ICD-10-CM

## 2018-04-04 DIAGNOSIS — L97509 Non-pressure chronic ulcer of other part of unspecified foot with unspecified severity: Secondary | ICD-10-CM

## 2018-04-04 HISTORY — DX: Non-pressure chronic ulcer of other part of unspecified foot with unspecified severity: L97.509

## 2018-04-04 HISTORY — DX: Type 2 diabetes mellitus with foot ulcer: E11.621

## 2018-04-12 DIAGNOSIS — L97522 Non-pressure chronic ulcer of other part of left foot with fat layer exposed: Secondary | ICD-10-CM | POA: Diagnosis not present

## 2018-04-12 DIAGNOSIS — E11621 Type 2 diabetes mellitus with foot ulcer: Secondary | ICD-10-CM | POA: Diagnosis not present

## 2018-04-12 DIAGNOSIS — L84 Corns and callosities: Secondary | ICD-10-CM | POA: Diagnosis not present

## 2018-04-19 DIAGNOSIS — L97318 Non-pressure chronic ulcer of right ankle with other specified severity: Secondary | ICD-10-CM | POA: Diagnosis not present

## 2018-04-19 DIAGNOSIS — E11621 Type 2 diabetes mellitus with foot ulcer: Secondary | ICD-10-CM | POA: Diagnosis not present

## 2018-04-19 DIAGNOSIS — I10 Essential (primary) hypertension: Secondary | ICD-10-CM | POA: Diagnosis not present

## 2018-04-19 DIAGNOSIS — Z794 Long term (current) use of insulin: Secondary | ICD-10-CM | POA: Diagnosis not present

## 2018-04-19 DIAGNOSIS — S91301A Unspecified open wound, right foot, initial encounter: Secondary | ICD-10-CM | POA: Diagnosis not present

## 2018-04-19 DIAGNOSIS — L97419 Non-pressure chronic ulcer of right heel and midfoot with unspecified severity: Secondary | ICD-10-CM | POA: Diagnosis not present

## 2018-04-21 ENCOUNTER — Other Ambulatory Visit (HOSPITAL_COMMUNITY): Payer: Self-pay

## 2018-04-21 ENCOUNTER — Emergency Department (HOSPITAL_COMMUNITY): Payer: Medicare Other

## 2018-04-21 ENCOUNTER — Inpatient Hospital Stay (HOSPITAL_COMMUNITY): Payer: Medicare Other

## 2018-04-21 ENCOUNTER — Inpatient Hospital Stay (HOSPITAL_COMMUNITY)
Admission: EM | Admit: 2018-04-21 | Discharge: 2018-04-27 | DRG: 872 | Disposition: A | Discharge status: Home / Self Care | Payer: Medicare Other | Attending: Internal Medicine | Admitting: Internal Medicine

## 2018-04-21 ENCOUNTER — Other Ambulatory Visit: Payer: Self-pay

## 2018-04-21 ENCOUNTER — Encounter (HOSPITAL_COMMUNITY): Payer: Self-pay | Admitting: Emergency Medicine

## 2018-04-21 DIAGNOSIS — D649 Anemia, unspecified: Secondary | ICD-10-CM | POA: Diagnosis not present

## 2018-04-21 DIAGNOSIS — I10 Essential (primary) hypertension: Secondary | ICD-10-CM | POA: Diagnosis not present

## 2018-04-21 DIAGNOSIS — L03115 Cellulitis of right lower limb: Secondary | ICD-10-CM | POA: Diagnosis not present

## 2018-04-21 DIAGNOSIS — R0602 Shortness of breath: Secondary | ICD-10-CM

## 2018-04-21 DIAGNOSIS — Z6841 Body Mass Index (BMI) 40.0 and over, adult: Secondary | ICD-10-CM

## 2018-04-21 DIAGNOSIS — E785 Hyperlipidemia, unspecified: Secondary | ICD-10-CM | POA: Diagnosis present

## 2018-04-21 DIAGNOSIS — E1140 Type 2 diabetes mellitus with diabetic neuropathy, unspecified: Secondary | ICD-10-CM | POA: Diagnosis not present

## 2018-04-21 DIAGNOSIS — L97412 Non-pressure chronic ulcer of right heel and midfoot with fat layer exposed: Secondary | ICD-10-CM | POA: Diagnosis not present

## 2018-04-21 DIAGNOSIS — E11621 Type 2 diabetes mellitus with foot ulcer: Secondary | ICD-10-CM | POA: Diagnosis present

## 2018-04-21 DIAGNOSIS — M609 Myositis, unspecified: Secondary | ICD-10-CM | POA: Diagnosis present

## 2018-04-21 DIAGNOSIS — D638 Anemia in other chronic diseases classified elsewhere: Secondary | ICD-10-CM | POA: Diagnosis present

## 2018-04-21 DIAGNOSIS — L97529 Non-pressure chronic ulcer of other part of left foot with unspecified severity: Secondary | ICD-10-CM | POA: Diagnosis not present

## 2018-04-21 DIAGNOSIS — E11628 Type 2 diabetes mellitus with other skin complications: Secondary | ICD-10-CM | POA: Diagnosis not present

## 2018-04-21 DIAGNOSIS — K219 Gastro-esophageal reflux disease without esophagitis: Secondary | ICD-10-CM | POA: Diagnosis present

## 2018-04-21 DIAGNOSIS — Z791 Long term (current) use of non-steroidal anti-inflammatories (NSAID): Secondary | ICD-10-CM

## 2018-04-21 DIAGNOSIS — N179 Acute kidney failure, unspecified: Secondary | ICD-10-CM | POA: Diagnosis not present

## 2018-04-21 DIAGNOSIS — E119 Type 2 diabetes mellitus without complications: Secondary | ICD-10-CM

## 2018-04-21 DIAGNOSIS — Z794 Long term (current) use of insulin: Secondary | ICD-10-CM

## 2018-04-21 DIAGNOSIS — F17210 Nicotine dependence, cigarettes, uncomplicated: Secondary | ICD-10-CM | POA: Diagnosis present

## 2018-04-21 DIAGNOSIS — L97519 Non-pressure chronic ulcer of other part of right foot with unspecified severity: Secondary | ICD-10-CM | POA: Diagnosis not present

## 2018-04-21 DIAGNOSIS — A419 Sepsis, unspecified organism: Secondary | ICD-10-CM | POA: Diagnosis not present

## 2018-04-21 DIAGNOSIS — Z8711 Personal history of peptic ulcer disease: Secondary | ICD-10-CM | POA: Diagnosis not present

## 2018-04-21 DIAGNOSIS — E1165 Type 2 diabetes mellitus with hyperglycemia: Secondary | ICD-10-CM | POA: Diagnosis not present

## 2018-04-21 DIAGNOSIS — Z79899 Other long term (current) drug therapy: Secondary | ICD-10-CM | POA: Diagnosis not present

## 2018-04-21 DIAGNOSIS — E440 Moderate protein-calorie malnutrition: Secondary | ICD-10-CM | POA: Diagnosis not present

## 2018-04-21 DIAGNOSIS — L089 Local infection of the skin and subcutaneous tissue, unspecified: Secondary | ICD-10-CM | POA: Diagnosis not present

## 2018-04-21 DIAGNOSIS — L97419 Non-pressure chronic ulcer of right heel and midfoot with unspecified severity: Secondary | ICD-10-CM | POA: Diagnosis not present

## 2018-04-21 DIAGNOSIS — R197 Diarrhea, unspecified: Secondary | ICD-10-CM | POA: Diagnosis not present

## 2018-04-21 DIAGNOSIS — N289 Disorder of kidney and ureter, unspecified: Secondary | ICD-10-CM

## 2018-04-21 DIAGNOSIS — L84 Corns and callosities: Secondary | ICD-10-CM | POA: Diagnosis not present

## 2018-04-21 DIAGNOSIS — J449 Chronic obstructive pulmonary disease, unspecified: Secondary | ICD-10-CM | POA: Diagnosis present

## 2018-04-21 DIAGNOSIS — F418 Other specified anxiety disorders: Secondary | ICD-10-CM | POA: Diagnosis present

## 2018-04-21 DIAGNOSIS — Z87891 Personal history of nicotine dependence: Secondary | ICD-10-CM | POA: Diagnosis not present

## 2018-04-21 DIAGNOSIS — L97522 Non-pressure chronic ulcer of other part of left foot with fat layer exposed: Secondary | ICD-10-CM | POA: Diagnosis not present

## 2018-04-21 DIAGNOSIS — S62624A Displaced fracture of medial phalanx of right ring finger, initial encounter for closed fracture: Secondary | ICD-10-CM | POA: Diagnosis not present

## 2018-04-21 DIAGNOSIS — L0390 Cellulitis, unspecified: Secondary | ICD-10-CM | POA: Diagnosis not present

## 2018-04-21 DIAGNOSIS — E118 Type 2 diabetes mellitus with unspecified complications: Secondary | ICD-10-CM | POA: Diagnosis not present

## 2018-04-21 DIAGNOSIS — M7989 Other specified soft tissue disorders: Secondary | ICD-10-CM | POA: Diagnosis not present

## 2018-04-21 HISTORY — DX: Chronic obstructive pulmonary disease, unspecified: J44.9

## 2018-04-21 HISTORY — DX: Polyneuropathy, unspecified: G62.9

## 2018-04-21 HISTORY — DX: Type 2 diabetes mellitus with foot ulcer: E11.621

## 2018-04-21 HISTORY — DX: Non-pressure chronic ulcer of other part of unspecified foot with unspecified severity: L97.509

## 2018-04-21 LAB — HEMOGLOBIN A1C
Hgb A1c MFr Bld: 8.3 % — ABNORMAL HIGH (ref 4.8–5.6)
MEAN PLASMA GLUCOSE: 191.51 mg/dL

## 2018-04-21 LAB — BASIC METABOLIC PANEL
Anion gap: 14 (ref 5–15)
BUN: 10 mg/dL (ref 6–20)
CHLORIDE: 101 mmol/L (ref 98–111)
CO2: 21 mmol/L — ABNORMAL LOW (ref 22–32)
Calcium: 8.7 mg/dL — ABNORMAL LOW (ref 8.9–10.3)
Creatinine, Ser: 1.07 mg/dL — ABNORMAL HIGH (ref 0.44–1.00)
GFR calc non Af Amer: 57 mL/min — ABNORMAL LOW (ref >60)
Glucose, Bld: 128 mg/dL — ABNORMAL HIGH (ref 70–99)
POTASSIUM: 4.5 mmol/L (ref 3.5–5.1)
SODIUM: 136 mmol/L (ref 135–145)

## 2018-04-21 LAB — CBC WITH DIFFERENTIAL/PLATELET
ABS IMMATURE GRANULOCYTES: 0.1 10*3/uL (ref 0.0–0.1)
BASOS ABS: 0.1 10*3/uL (ref 0.0–0.1)
Basophils Relative: 0 %
Eosinophils Absolute: 0.2 10*3/uL (ref 0.0–0.7)
Eosinophils Relative: 2 %
HCT: 34.4 % — ABNORMAL LOW (ref 36.0–46.0)
HEMOGLOBIN: 10 g/dL — AB (ref 12.0–15.0)
IMMATURE GRANULOCYTES: 1 %
LYMPHS PCT: 12 %
Lymphs Abs: 1.7 10*3/uL (ref 0.7–4.0)
MCH: 24.2 pg — ABNORMAL LOW (ref 26.0–34.0)
MCHC: 29.1 g/dL — ABNORMAL LOW (ref 30.0–36.0)
MCV: 83.3 fL (ref 78.0–100.0)
Monocytes Absolute: 0.8 10*3/uL (ref 0.1–1.0)
Monocytes Relative: 6 %
NEUTROS ABS: 11.3 10*3/uL — AB (ref 1.7–7.7)
Neutrophils Relative %: 79 %
Platelets: 291 10*3/uL (ref 150–400)
RBC: 4.13 MIL/uL (ref 3.87–5.11)
RDW: 17.2 % — ABNORMAL HIGH (ref 11.5–15.5)
WBC: 14.2 10*3/uL — AB (ref 4.0–10.5)

## 2018-04-21 LAB — PREALBUMIN: PREALBUMIN: 8.8 mg/dL — AB (ref 18–38)

## 2018-04-21 LAB — GLUCOSE, CAPILLARY: Glucose-Capillary: 107 mg/dL — ABNORMAL HIGH (ref 70–99)

## 2018-04-21 LAB — I-STAT CG4 LACTIC ACID, ED: Lactic Acid, Venous: 1.83 mmol/L (ref 0.5–1.9)

## 2018-04-21 LAB — SEDIMENTATION RATE: SED RATE: 105 mm/h — AB (ref 0–22)

## 2018-04-21 LAB — C-REACTIVE PROTEIN: CRP: 21.2 mg/dL — AB (ref <1.0)

## 2018-04-21 LAB — CBG MONITORING, ED: GLUCOSE-CAPILLARY: 98 mg/dL (ref 70–99)

## 2018-04-21 MED ORDER — VANCOMYCIN HCL 10 G IV SOLR
2500.0000 mg | Freq: Once | INTRAVENOUS | Status: AC
  Administered 2018-04-21: 2500 mg via INTRAVENOUS
  Filled 2018-04-21 (×2): qty 2500

## 2018-04-21 MED ORDER — LURASIDONE HCL 20 MG PO TABS
20.0000 mg | ORAL_TABLET | Freq: Every day | ORAL | Status: DC
  Administered 2018-04-22 – 2018-04-26 (×5): 20 mg via ORAL
  Filled 2018-04-21 (×6): qty 1

## 2018-04-21 MED ORDER — INSULIN ASPART 100 UNIT/ML ~~LOC~~ SOLN
0.0000 [IU] | SUBCUTANEOUS | Status: DC
  Administered 2018-04-22: 5 [IU] via SUBCUTANEOUS
  Administered 2018-04-22: 1 [IU] via SUBCUTANEOUS
  Administered 2018-04-22 (×2): 2 [IU] via SUBCUTANEOUS
  Administered 2018-04-22: 3 [IU] via SUBCUTANEOUS
  Administered 2018-04-22 – 2018-04-23 (×2): 2 [IU] via SUBCUTANEOUS
  Administered 2018-04-23: 7 [IU] via SUBCUTANEOUS
  Administered 2018-04-23 (×2): 5 [IU] via SUBCUTANEOUS
  Administered 2018-04-23: 2 [IU] via SUBCUTANEOUS
  Administered 2018-04-23: 3 [IU] via SUBCUTANEOUS
  Administered 2018-04-24: 5 [IU] via SUBCUTANEOUS
  Administered 2018-04-24: 7 [IU] via SUBCUTANEOUS
  Administered 2018-04-24 (×2): 3 [IU] via SUBCUTANEOUS
  Administered 2018-04-24: 5 [IU] via SUBCUTANEOUS
  Administered 2018-04-24: 2 [IU] via SUBCUTANEOUS
  Administered 2018-04-25: 1 [IU] via SUBCUTANEOUS
  Administered 2018-04-25: 7 [IU] via SUBCUTANEOUS
  Administered 2018-04-25 (×2): 5 [IU] via SUBCUTANEOUS
  Administered 2018-04-25 – 2018-04-26 (×4): 2 [IU] via SUBCUTANEOUS
  Administered 2018-04-26: 5 [IU] via SUBCUTANEOUS
  Administered 2018-04-26: 3 [IU] via SUBCUTANEOUS
  Administered 2018-04-26: 2 [IU] via SUBCUTANEOUS
  Administered 2018-04-26 – 2018-04-27 (×2): 5 [IU] via SUBCUTANEOUS
  Administered 2018-04-27 (×2): 2 [IU] via SUBCUTANEOUS
  Administered 2018-04-27: 1 [IU] via SUBCUTANEOUS
  Administered 2018-04-27: 5 [IU] via SUBCUTANEOUS

## 2018-04-21 MED ORDER — ONDANSETRON HCL 4 MG/2ML IJ SOLN: 4.0000 mg | Freq: Four times a day (QID) | INTRAMUSCULAR | Status: DC | PRN

## 2018-04-21 MED ORDER — METOPROLOL TARTRATE 25 MG PO TABS
50.0000 mg | ORAL_TABLET | Freq: Two times a day (BID) | ORAL | Status: DC
  Administered 2018-04-21 – 2018-04-27 (×12): 50 mg via ORAL
  Filled 2018-04-21 (×12): qty 2

## 2018-04-21 MED ORDER — SODIUM CHLORIDE 0.9% FLUSH
3.0000 mL | Freq: Two times a day (BID) | INTRAVENOUS | Status: DC
  Administered 2018-04-22 – 2018-04-27 (×8): 3 mL via INTRAVENOUS

## 2018-04-21 MED ORDER — INSULIN GLARGINE 100 UNIT/ML ~~LOC~~ SOLN
40.0000 [IU] | Freq: Two times a day (BID) | SUBCUTANEOUS | Status: DC
  Administered 2018-04-21 – 2018-04-26 (×10): 40 [IU] via SUBCUTANEOUS
  Filled 2018-04-21 (×12): qty 0.4

## 2018-04-21 MED ORDER — ACETAMINOPHEN 325 MG PO TABS
650.0000 mg | ORAL_TABLET | Freq: Once | ORAL | Status: AC | PRN
  Administered 2018-04-21: 650 mg via ORAL
  Filled 2018-04-21: qty 2

## 2018-04-21 MED ORDER — MOMETASONE FURO-FORMOTEROL FUM 100-5 MCG/ACT IN AERO
2.0000 | INHALATION_SPRAY | Freq: Two times a day (BID) | RESPIRATORY_TRACT | Status: DC
Start: 2018-04-21 — End: 2018-04-27
  Administered 2018-04-22 – 2018-04-27 (×11): 2 via RESPIRATORY_TRACT
  Filled 2018-04-21 (×2): qty 8.8

## 2018-04-21 MED ORDER — VANCOMYCIN HCL IN DEXTROSE 1-5 GM/200ML-% IV SOLN
1000.0000 mg | Freq: Two times a day (BID) | INTRAVENOUS | Status: DC
  Administered 2018-04-22 – 2018-04-23 (×3): 1000 mg via INTRAVENOUS
  Filled 2018-04-21 (×4): qty 200

## 2018-04-21 MED ORDER — PIPERACILLIN-TAZOBACTAM 3.375 G IVPB 30 MIN
3.3750 g | Freq: Once | INTRAVENOUS | Status: AC
  Administered 2018-04-21: 3.375 g via INTRAVENOUS
  Filled 2018-04-21: qty 50

## 2018-04-21 MED ORDER — SODIUM CHLORIDE 0.9 % IV SOLN
2.0000 g | INTRAVENOUS | Status: DC
  Administered 2018-04-21 – 2018-04-22 (×2): 2 g via INTRAVENOUS
  Filled 2018-04-21 (×2): qty 20

## 2018-04-21 MED ORDER — KETOROLAC TROMETHAMINE 15 MG/ML IJ SOLN
15.0000 mg | Freq: Four times a day (QID) | INTRAMUSCULAR | Status: AC | PRN
  Administered 2018-04-21 – 2018-04-22 (×2): 15 mg via INTRAVENOUS
  Filled 2018-04-21 (×3): qty 1

## 2018-04-21 MED ORDER — ACETAMINOPHEN 650 MG RE SUPP: 650.0000 mg | Freq: Four times a day (QID) | RECTAL | Status: DC | PRN

## 2018-04-21 MED ORDER — SODIUM CHLORIDE 0.9 % IV SOLN
INTRAVENOUS | Status: AC
  Administered 2018-04-21: 22:00:00 via INTRAVENOUS

## 2018-04-21 MED ORDER — BUPROPION HCL ER (SR) 100 MG PO TB12
200.0000 mg | ORAL_TABLET | Freq: Two times a day (BID) | ORAL | Status: DC
  Administered 2018-04-22 – 2018-04-27 (×12): 200 mg via ORAL
  Filled 2018-04-21 (×14): qty 2

## 2018-04-21 MED ORDER — ONDANSETRON HCL 4 MG PO TABS: 4.0000 mg | ORAL_TABLET | Freq: Four times a day (QID) | ORAL | Status: DC | PRN

## 2018-04-21 MED ORDER — AMLODIPINE BESYLATE 5 MG PO TABS
5.0000 mg | ORAL_TABLET | Freq: Every day | ORAL | Status: DC
  Administered 2018-04-22 – 2018-04-27 (×6): 5 mg via ORAL
  Filled 2018-04-21 (×2): qty 1
  Filled 2018-04-21: qty 2
  Filled 2018-04-21 (×3): qty 1

## 2018-04-21 MED ORDER — SODIUM CHLORIDE 0.9% FLUSH: 3.0000 mL | INTRAVENOUS | Status: DC | PRN

## 2018-04-21 MED ORDER — PREGABALIN 75 MG PO CAPS
675.0000 mg | ORAL_CAPSULE | Freq: Every day | ORAL | Status: DC
  Administered 2018-04-21 – 2018-04-26 (×6): 675 mg via ORAL
  Filled 2018-04-21 (×6): qty 8

## 2018-04-21 MED ORDER — METRONIDAZOLE IN NACL 5-0.79 MG/ML-% IV SOLN
500.0000 mg | Freq: Three times a day (TID) | INTRAVENOUS | Status: DC
  Administered 2018-04-21 – 2018-04-23 (×5): 500 mg via INTRAVENOUS
  Filled 2018-04-21 (×6): qty 100

## 2018-04-21 MED ORDER — PANTOPRAZOLE SODIUM 40 MG PO TBEC
40.0000 mg | DELAYED_RELEASE_TABLET | Freq: Every day | ORAL | Status: DC
Start: 2018-04-22 — End: 2018-04-27
  Administered 2018-04-22 – 2018-04-27 (×6): 40 mg via ORAL
  Filled 2018-04-21 (×6): qty 1

## 2018-04-21 MED ORDER — SODIUM CHLORIDE 0.9 % IV SOLN: INTRAVENOUS | Status: DC

## 2018-04-21 MED ORDER — ALBUTEROL SULFATE (2.5 MG/3ML) 0.083% IN NEBU: 2.5000 mg | INHALATION_SOLUTION | Freq: Four times a day (QID) | RESPIRATORY_TRACT | Status: DC | PRN

## 2018-04-21 MED ORDER — ACETAMINOPHEN 325 MG PO TABS
650.0000 mg | ORAL_TABLET | Freq: Four times a day (QID) | ORAL | Status: DC | PRN
  Administered 2018-04-24: 650 mg via ORAL
  Filled 2018-04-21: qty 2

## 2018-04-21 MED ORDER — DULOXETINE HCL 60 MG PO CPEP
60.0000 mg | ORAL_CAPSULE | Freq: Every day | ORAL | Status: DC
  Administered 2018-04-22 – 2018-04-27 (×6): 60 mg via ORAL
  Filled 2018-04-21 (×6): qty 1

## 2018-04-21 MED ORDER — ONDANSETRON HCL 4 MG/2ML IJ SOLN
4.0000 mg | Freq: Once | INTRAMUSCULAR | Status: AC
  Administered 2018-04-21: 4 mg via INTRAVENOUS
  Filled 2018-04-21: qty 2

## 2018-04-21 MED ORDER — SODIUM CHLORIDE 0.9 % IV SOLN: 250.0000 mL | INTRAVENOUS | Status: DC | PRN

## 2018-04-21 MED ORDER — QUETIAPINE FUMARATE 100 MG PO TABS
100.0000 mg | ORAL_TABLET | Freq: Every day | ORAL | Status: DC
  Administered 2018-04-21 – 2018-04-26 (×6): 100 mg via ORAL
  Filled 2018-04-21 (×6): qty 1

## 2018-04-21 MED ORDER — HYDROCODONE-ACETAMINOPHEN 5-325 MG PO TABS
1.0000 | ORAL_TABLET | ORAL | Status: DC | PRN
  Administered 2018-04-21 – 2018-04-25 (×9): 2 via ORAL
  Administered 2018-04-26 – 2018-04-27 (×2): 1 via ORAL
  Filled 2018-04-21 (×3): qty 2
  Filled 2018-04-21 (×2): qty 1
  Filled 2018-04-21 (×6): qty 2

## 2018-04-21 MED ORDER — BISACODYL 5 MG PO TBEC
5.0000 mg | DELAYED_RELEASE_TABLET | Freq: Every day | ORAL | Status: DC | PRN
Start: 2018-04-21 — End: 2018-04-27
  Administered 2018-04-22: 5 mg via ORAL
  Filled 2018-04-21: qty 1

## 2018-04-21 MED ORDER — HYDROMORPHONE HCL 1 MG/ML IJ SOLN
0.5000 mg | Freq: Once | INTRAMUSCULAR | Status: AC
  Administered 2018-04-21: 0.5 mg via INTRAVENOUS
  Filled 2018-04-21: qty 1

## 2018-04-21 MED ORDER — SENNOSIDES-DOCUSATE SODIUM 8.6-50 MG PO TABS: 1.0000 | ORAL_TABLET | Freq: Every evening | ORAL | Status: DC | PRN

## 2018-04-21 NOTE — ED Provider Notes (Signed)
Medical screening examination/treatment/procedure(s) were conducted as a shared visit with non-physician practitioner(s) and myself.  I personally evaluated the patient during the encounter.  None Patient had not been aware that she had a wound until 3 days ago.  She started on doxycycline as an outpatient.  It has gotten much more red and patient was referred to the emergency department by the wound clinic.  Is alert and oriented.  No acute distress.  Severe erythema of the heel and sole of the foot tracking up to the lateral ankle where there is a approximately 1.5 cm bullae.  Large greater than 5 cm slit of the dermis of the sole of the foot.  Agree with plan of management.   Arby BarrettePfeiffer, Alyscia Carmon, MD 04/21/18 1715

## 2018-04-21 NOTE — ED Provider Notes (Signed)
[facility] EMERGENCY DEPARTMENT Provider Note   CSN: [id number] Arrival date & time: 04/21/18  1319     History   Chief Complaint Chief Complaint  Patient presents with  . Wound Infection    HPI Toriana C Crossley is a 57 y.o. female.  Patient with history of chronic L toe wound followed by wound care in [place], DM, neuropathy -- presents with infected R foot and heel wound. Patient states she felt unwell for the past week without fever until today. She noticed a new wound on her R foot 2 days ago. She was seen at her local hospital who started her on doxycycline and discharged.  Patient followed up with where her wound care doctor today and was sent to the emergency department with concern for sepsis, possible osteomyelitis.  She has developed a low-grade fever.  No vomiting or diarrhea.  No chest pain or abdominal pain.  This is a new wound and she did not realize that she had it up until 3 days ago.  She does report drainage and foul odor. The onset of this condition was acute. The course is worsening. Aggravating factors: none. Alleviating factors: none.       Past Medical History:  Diagnosis Date  . Anemia    iron deficiency  . Anxiety   . COPD (chronic obstructive pulmonary disease) (HCC)   . Depression   . Diabetes mellitus    type 2  . GERD (gastroesophageal reflux disease)   . Headache(784.0)   . History of colonic polyps   . Hyperlipidemia   . Hypertension   . Neuropathy   . Peptic ulcer disease     Patient Active Problem List   Diagnosis Date Noted  . Preventative health care 04/14/2011  . Cough 03/11/2011  . DYSMETABOLIC SYNDROME 12/08/2010  . ANEMIA, B12 DEFICIENCY 10/01/2010  . ULNAR NEUROPATHY, BILATERAL 10/01/2010  . DIABETES MELLITUS, TYPE II 09/11/2010  . HYPERLIPIDEMIA 09/11/2010  . OBESITY 09/11/2010  . ANEMIA-IRON DEFICIENCY 09/11/2010  . ANXIETY 09/11/2010  . TOBACCO USE 09/11/2010  . Depressive disorder, not elsewhere  classified 09/11/2010  . Unspecified essential hypertension 09/11/2010  . GERD 09/11/2010  . PEPTIC ULCER DISEASE 09/11/2010  . Headache(784.0) 09/11/2010  . COLONIC POLYPS, HX OF 09/11/2010  . Clinical depression 02/27/2010  . Diabetes mellitus (HCC) 02/27/2010  . Lumbosacral spondylosis 02/27/2010    Past Surgical History:  Procedure Laterality Date  . ESOPHAGOGASTRODUODENOSCOPY  10/11/2011   Procedure: ESOPHAGOGASTRODUODENOSCOPY (EGD);  Surgeon: Erick Blinks, MD;  Location: [facility] ENDOSCOPY;  Service: Gastroenterology;  Laterality: N/A;  . TUBAL LIGATION       OB History   None      Home Medications    Prior to Admission medications   Medication Sig Start Date End Date Taking? Authorizing Provider  acetaminophen (TYLENOL) 500 MG tablet Take 1,000-1,050 mg by mouth daily as needed for moderate pain.   Yes [provider]  amLODipine (NORVASC) 5 MG tablet Take 5 mg by mouth daily. 03/11/18  Yes [provider]  buPROPion (WELLBUTRIN SR) 200 MG 12 hr tablet Take 200 mg by mouth 2 (two) times daily. 04/16/18  Yes [provider]  doxycycline (VIBRA-TABS) 100 MG tablet Take 100 mg by mouth 2 (two) times daily. 04/20/18 04/27/18 Yes [provider]  DULoxetine (CYMBALTA) 30 MG capsule Take 30 mg by mouth daily. 04/16/18  Yes [provider]  DULoxetine (CYMBALTA) 60 MG capsule Take 60 mg by mouth daily.    Yes  [provider]  fluticasone (FLONASE) 50 MCG/ACT nasal spray Place 2 sprays into both nostrils daily. 02/16/18  Yes [provider]  HUMALOG KWIKPEN 100 UNIT/ML KiwkPen Inject 0-25 Units as directed 3 (three) times daily as needed. Per sliding scale 02/10/18  Yes [provider]  ibuprofen (ADVIL,MOTRIN) 200 MG tablet Take 600 mg by mouth 3 (three) times daily as needed for moderate pain.   Yes [provider]  insulin glargine (LANTUS) 100 UNIT/ML injection Inject 80 Units into the skin 2 (two) times daily.     Yes [provider]  INVOKANA 300 MG TABS tablet Take 300 mg by mouth daily. 03/19/18  Yes [provider]  LATUDA 20 MG TABS tablet Take 20 mg by mouth at bedtime. 04/16/18  Yes [provider]  LYRICA 225 MG capsule Take 675 mg by mouth at bedtime. 04/09/18  Yes [provider]  meloxicam (MOBIC) 7.5 MG tablet Take 7.5 mg by mouth at bedtime. 03/29/18  Yes [provider]  metFORMIN (GLUCOPHAGE) 1000 MG tablet Take 1,000 mg by mouth 2 (two) times daily with a meal.   Yes [provider]  metoprolol tartrate (LOPRESSOR) 50 MG tablet Take 50 mg by mouth 2 (two) times daily. 02/10/18  Yes [provider]  nystatin (MYCOSTATIN) 100000 UNIT/ML suspension Take 10 mLs by mouth 4 (four) times daily. Pt. States they do not measure their dose and hat they "just takes a swig" 03/21/18  Yes [provider]  omeprazole (PRILOSEC) 40 MG capsule Take 40 mg by mouth daily. 03/19/18  Yes [provider]  QUEtiapine (SEROQUEL) 100 MG tablet Take 100 mg by mouth at bedtime. 04/16/18  Yes [provider]  SYMBICORT 80-4.5 MCG/ACT inhaler Inhale 2 puffs into the lungs 2 (two) times daily. 03/19/18  Yes [provider]  VICTOZA 18 MG/3ML SOPN Inject into the skin daily. 1.8 ml 02/10/18  Yes [provider]  insulin aspart (NOVOLOG FLEXPEN) 100 UNIT/ML injection Inject 10-25 Units into the skin 3 (three) times daily before meals. As directed per sliding scale 02/10/11 02/10/12  Etta Grandchild, MD    Family History Family History  Problem Relation Age of Onset  . Alcohol abuse Other   . Drug abuse Other   . Arthritis Other   . Diabetes Other   . Depression Other   . Hypertension Other   . Kidney disease Other   . Cancer Other        Breast,Colon,Lung, and Prostate Cancer  . Stomach cancer Father 47  . Heart attack Brother 51  . Diabetes type II Unknown        brother, both parents    Social History Social History     Tobacco Use  . Smoking status: Current Every Day Smoker    Packs/day: 0.50    Years: 25.00    Pack years: 12.50    Types: Cigarettes  . Smokeless tobacco: Never Used  Substance Use Topics  . Alcohol use: No  . Drug use: No     Allergies   Patient has no known allergies.   Review of Systems Review of Systems  Constitutional: Positive for chills, fatigue and fever.  HENT: Negative for rhinorrhea and sore throat.   Eyes: Negative for redness.  Respiratory: Negative for cough.   Cardiovascular: Negative for chest pain.  Gastrointestinal: Negative for abdominal pain, diarrhea, nausea and vomiting.  Genitourinary: Negative for dysuria.  Musculoskeletal: Negative for myalgias.  Skin: Positive for wound. Negative  for rash.  Neurological: Negative for headaches.     Physical Exam Updated Vital Signs BP (!) 145/58   Pulse (!) 106   Temp (!) 100.7 F (38.2 C) (Oral)   Resp 15   Wt (!) 162.8 kg (359 lb)   LMP  (LMP Unknown)   SpO2 98%   BMI 57.94 kg/m   Physical Exam  Musculoskeletal:  Patient with 8 cm x 5 cm ulceration to plantar aspect of the right foot as demonstrated.  Foul odor noted.  There is significant cellulitis noted to the bottom of the foot, the heel, and the medial and lateral aspects of the foot.  No lymphangitis.             ED Treatments / Results  Labs (all labs ordered are listed, but only abnormal results are displayed) Labs Reviewed  CBC WITH DIFFERENTIAL/PLATELET - Abnormal; Notable for the following components:      Result Value   WBC 14.2 (*)    Hemoglobin 10.0 (*)    HCT 34.4 (*)    MCH 24.2 (*)    MCHC 29.1 (*)    RDW 17.2 (*)    Neutro Abs 11.3 (*)    All other components within normal limits  BASIC METABOLIC PANEL - Abnormal; Notable for the following components:   CO2 21 (*)    Glucose, Bld 128 (*)    Creatinine, Ser 1.07 (*)    Calcium 8.7 (*)    GFR calc non Af Amer 57 (*)    All other components within normal  limits  CULTURE, BLOOD (ROUTINE X 2)  CULTURE, BLOOD (ROUTINE X 2)  I-STAT CG4 LACTIC ACID, ED  CBG MONITORING, ED    EKG None  Radiology Dg Foot Complete Right  Result Date: 04/21/2018 CLINICAL DATA:  Wound infection EXAM: RIGHT FOOT COMPLETE - 3+ VIEW COMPARISON:  04/19/2018 FINDINGS: Osseous mineralization normal. Displaced intra-articular fracture at base of middle phalanx second toe unchanged. Joint spaces otherwise preserved. Plantar calcaneal spur. Large plantar ulcer at heel. No acute fracture, dislocation, or bone destruction. IMPRESSION: Large plantar calcaneal ulcer without acute bony abnormalities; if there is persistent clinical concern for osteomyelitis recommend MR assessment. Plantar calcaneal spurring. Displaced intra-articular fracture at base of middle phalanx RIGHT second toe. Electronically Signed   By: Ulyses SouthwardMark  Boles M.D.   On: 04/21/2018 14:57    Procedures Procedures (including critical care time)  Medications Ordered in ED Medications  vancomycin (VANCOCIN) 2,500 mg in sodium chloride 0.9 % 500 mL IVPB (has no administration in time range)  acetaminophen (TYLENOL) tablet 650 mg (650 mg Oral Given 04/21/18 1703)  HYDROmorphone (DILAUDID) injection 0.5 mg (0.5 mg Intravenous Given 04/21/18 1748)  ondansetron (ZOFRAN) injection 4 mg (4 mg Intravenous Given 04/21/18 1748)  piperacillin-tazobactam (ZOSYN) IVPB 3.375 g (3.375 g Intravenous New Bag/Given 04/21/18 1801)     Initial Impression / Assessment and Plan / ED Course  I have reviewed the triage vital signs and the nursing notes.  Pertinent labs & imaging results that were available during my care of the patient were reviewed by me and considered in my medical decision making (see chart for details).     Patient seen and examined.   Vital signs reviewed and are as follows: BP (!) 152/99   Pulse (!) 103   Temp (!) 100.7 F (38.2 C) (Oral)   Resp 15   Wt (!) 162.8 kg (359 lb)   LMP  (LMP Unknown)   SpO2  94%  BMI 57.94 kg/m   Patient discussed with and seen by Dr. Donnald Garre.  X-ray does not demonstrate any acute signs of osteo.  Elevated white blood cell count noted.  Fever to 100.69F here.  Blood cultures sent.  Antibiotics ordered.  CRITICAL CARE Performed by: Carolee Rota Total critical care time: 30 minutes Critical care time was exclusive of separately billable procedures and treating other patients. Critical care was necessary to treat or prevent imminent or life-threatening deterioration. Critical care was time spent personally by me on the following activities: development of treatment plan with patient and/or surrogate as well as nursing, discussions with consultants, evaluation of patient's response to treatment, examination of patient, obtaining history from patient or surrogate, ordering and performing treatments and interventions, ordering and review of laboratory studies, ordering and review of radiographic studies, pulse oximetry and re-evaluation of patient's condition.  7:13 PM Pt stable. Spoke with Dr. Lajuana Ripple who will see patient.      Final Clinical Impressions(s) / ED Diagnoses   Final diagnoses:  Diabetic infection of right foot (HCC)  Sepsis, due to unspecified organism (HCC)   Admit.   ED Discharge Orders    None       Renne Crigler, Cordelia Poche 04/21/18 1915    Arby Barrette, MD 05/07/18 [phone]

## 2018-04-21 NOTE — H&P (Signed)
History and Physical    [name]:[id number] DOB: 04-29-1961 DOA: 04/21/2018  PCP: Wilmer Floor., MD   Patient coming from: Home   Chief Complaint: Right foot wound, fever, malaise   HPI: [name] is a 57 y.o. female with medical history significant for morbid obesity, insulin-dependent diabetes mellitus, hypertension, depression with anxiety, and chronic left great toe ulcer, now presenting to the emergency department for evaluation of fevers, malaise, and right foot ulcer with surrounding erythema, swelling, and drainage.  Patient reports that she developed a general malaise approximately 1 week ago and has had subjective fevers for the past 4 days, but did not realize she had a large ulcer at the plantar aspect of her right foot until 2 or 3 days ago.  She was seen at another emergency department at that time and discharged home with doxycycline.  She continues to have fevers and is now experiencing pain at the right foot.  She denies chest pain or palpitations but reports increase in her chronic dyspnea without any significant cough.  ED Course: Upon arrival to the ED, patient is found to be febrile to 38.2 C, saturating adequately on room air, tachycardic to 120, and with stable blood pressure.  EKG features a sinus tachycardia with rate 118 and plain films of the right foot are negative for osteomyelitis.  Chemistry panel is notable for creatinine 1.07 with no recent labs available for comparison.  CBC features a leukocytosis to 14,200 and normocytic anemia with hemoglobin 10.0.  Lactic acid is reassuringly normal.  Blood cultures were collected and the patient was treated with acetaminophen, Dilaudid, vancomycin, and Zosyn.  Tachycardia improved, she remains hemodynamically stable, and will be admitted for ongoing evaluation and management.  Review of Systems:  All other systems reviewed and apart from HPI, are negative.  Past Medical History:  Diagnosis Date  . Anemia     iron deficiency  . Anxiety   . COPD (chronic obstructive pulmonary disease) (HCC)   . Depression   . Diabetes mellitus    type 2  . GERD (gastroesophageal reflux disease)   . Headache(784.0)   . History of colonic polyps   . Hyperlipidemia   . Hypertension   . Neuropathy   . Peptic ulcer disease     Past Surgical History:  Procedure Laterality Date  . ESOPHAGOGASTRODUODENOSCOPY  10/11/2011   Procedure: ESOPHAGOGASTRODUODENOSCOPY (EGD);  Surgeon: Erick Blinks, MD;  Location: [facility] ENDOSCOPY;  Service: Gastroenterology;  Laterality: N/A;  . TUBAL LIGATION       reports that she has been smoking cigarettes.  She has a 12.50 pack-year smoking history. She has never used smokeless tobacco. She reports that she does not drink alcohol or use drugs.  No Known Allergies  Family History  Problem Relation Age of Onset  . Alcohol abuse Other   . Drug abuse Other   . Arthritis Other   . Diabetes Other   . Depression Other   . Hypertension Other   . Kidney disease Other   . Cancer Other        Breast,Colon,Lung, and Prostate Cancer  . Stomach cancer Father 68  . Heart attack Brother 51  . Diabetes type II Unknown        brother, both parents     Prior to Admission medications   Medication Sig Start Date End Date Taking? Authorizing Provider  acetaminophen (TYLENOL) 500 MG tablet Take 1,000-1,050 mg by mouth daily as needed for moderate pain.  Yes [provider]  amLODipine (NORVASC) 5 MG tablet Take 5 mg by mouth daily. 03/11/18  Yes [provider]  buPROPion (WELLBUTRIN SR) 200 MG 12 hr tablet Take 200 mg by mouth 2 (two) times daily. 04/16/18  Yes [provider]  doxycycline (VIBRA-TABS) 100 MG tablet Take 100 mg by mouth 2 (two) times daily. 04/20/18 04/27/18 Yes [provider]  DULoxetine (CYMBALTA) 30 MG capsule Take 30 mg by mouth daily. 04/16/18  Yes [provider]  DULoxetine (CYMBALTA) 60 MG capsule Take 60 mg by mouth daily.     Yes [provider]  fluticasone (FLONASE) 50 MCG/ACT nasal spray Place 2 sprays into both nostrils daily. 02/16/18  Yes [provider]  HUMALOG KWIKPEN 100 UNIT/ML KiwkPen Inject 0-25 Units as directed 3 (three) times daily as needed. Per sliding scale 02/10/18  Yes [provider]  ibuprofen (ADVIL,MOTRIN) 200 MG tablet Take 600 mg by mouth 3 (three) times daily as needed for moderate pain.   Yes [provider]  insulin glargine (LANTUS) 100 UNIT/ML injection Inject 80 Units into the skin 2 (two) times daily.    Yes [provider]  INVOKANA 300 MG TABS tablet Take 300 mg by mouth daily. 03/19/18  Yes [provider]  LATUDA 20 MG TABS tablet Take 20 mg by mouth at bedtime. 04/16/18  Yes [provider]  LYRICA 225 MG capsule Take 675 mg by mouth at bedtime. 04/09/18  Yes [provider]  meloxicam (MOBIC) 7.5 MG tablet Take 7.5 mg by mouth at bedtime. 03/29/18  Yes [provider]  metFORMIN (GLUCOPHAGE) 1000 MG tablet Take 1,000 mg by mouth 2 (two) times daily with a meal.   Yes [provider]  metoprolol tartrate (LOPRESSOR) 50 MG tablet Take 50 mg by mouth 2 (two) times daily. 02/10/18  Yes [provider]  nystatin (MYCOSTATIN) 100000 UNIT/ML suspension Take 10 mLs by mouth 4 (four) times daily. Pt. States they do not measure their dose and hat they "just takes a swig" 03/21/18  Yes [provider]  omeprazole (PRILOSEC) 40 MG capsule Take 40 mg by mouth daily. 03/19/18  Yes [provider]  QUEtiapine (SEROQUEL) 100 MG tablet Take 100 mg by mouth at bedtime. 04/16/18  Yes [provider]  SYMBICORT 80-4.5 MCG/ACT inhaler Inhale 2 puffs into the lungs 2 (two) times daily. 03/19/18  Yes [provider]  VICTOZA 18 MG/3ML SOPN Inject into the skin daily. 1.8 ml 02/10/18  Yes [provider]  insulin aspart (NOVOLOG FLEXPEN) 100 UNIT/ML injection Inject 10-25 Units  into the skin 3 (three) times daily before meals. As directed per sliding scale 02/10/11 02/10/12  Etta Grandchild, MD    Physical Exam: Vitals:   04/21/18 1800 04/21/18 1845 04/21/18 1900 04/21/18 1930  BP: (!) 145/58 (!) 152/69 (!) 152/99 133/85  Pulse: (!) 106 (!) 104 (!) 103 (!) 101  Resp: 15 18 15 17   Temp:      TempSrc:      SpO2: 98% 92% 94% 94%  Weight:          Constitutional: NAD, calm, obese Eyes: PERTLA, lids and conjunctivae normal ENMT: Mucous membranes are moist. Posterior pharynx clear of any exudate or lesions.   Neck: normal, supple, no masses, no thyromegaly Respiratory: clear to auscultation bilaterally, no wheezing, no crackles. Normal respiratory effort.    Cardiovascular: Rate ~110 and regular. Trace pretibial edema bilaterally. Abdomen: No distension, no tenderness, soft. Bowel sounds  active.  Musculoskeletal: no clubbing / cyanosis. No joint deformity upper and lower extremities.  Skin: Right heel ulcer with surrounding erythema, edema, warmth, and bullae at lateral aspect. Warm, dry, well-perfused. Neurologic: CN 2-12 grossly intact. Sensation to light touch diminished in feet. Strength 5/5 in all 4 limbs.  Psychiatric: Alert and oriented x 3. Calm, cooperative.     Labs on Admission: I have personally reviewed following labs and imaging studies  CBC: Recent Labs  Lab 04/21/18 1706  WBC 14.2*  NEUTROABS 11.3*  HGB 10.0*  HCT 34.4*  MCV 83.3  PLT 291   Basic Metabolic Panel: Recent Labs  Lab 04/21/18 1706  NA 136  K 4.5  CL 101  CO2 21*  GLUCOSE 128*  BUN 10  CREATININE 1.07*  CALCIUM 8.7*   GFR: CrCl cannot be calculated (Unknown ideal weight.). Liver Function Tests: No results for input(s): AST, ALT, ALKPHOS, BILITOT, PROT, ALBUMIN in the last 168 hours. No results for input(s): LIPASE, AMYLASE in the last 168 hours. No results for input(s): AMMONIA in the last 168 hours. Coagulation Profile: No results for input(s): INR, PROTIME  in the last 168 hours. Cardiac Enzymes: No results for input(s): CKTOTAL, CKMB, CKMBINDEX, TROPONINI in the last 168 hours. BNP (last 3 results) No results for input(s): PROBNP in the last 8760 hours. HbA1C: No results for input(s): HGBA1C in the last 72 hours. CBG: Recent Labs  Lab 04/21/18 1624  GLUCAP 98   Lipid Profile: No results for input(s): CHOL, HDL, LDLCALC, TRIG, CHOLHDL, LDLDIRECT in the last 72 hours. Thyroid Function Tests: No results for input(s): TSH, T4TOTAL, FREET4, T3FREE, THYROIDAB in the last 72 hours. Anemia Panel: No results for input(s): VITAMINB12, FOLATE, FERRITIN, TIBC, IRON, RETICCTPCT in the last 72 hours. Urine analysis:    Component Value Date/Time   COLORURINE YELLOW 10/08/2011 0746   APPEARANCEUR CLEAR 10/08/2011 0746   LABSPEC 1.038 (H) 10/08/2011 0746   PHURINE 5.5 10/08/2011 0746   GLUCOSEU >1000 (A) 10/08/2011 0746   GLUCOSEU >=1000 03/11/2011 1504   HGBUR SMALL (A) 10/08/2011 0746   BILIRUBINUR MODERATE (A) 10/08/2011 0746   KETONESUR >80 (A) 10/08/2011 0746   PROTEINUR NEGATIVE 10/08/2011 0746   UROBILINOGEN 0.2 10/08/2011 0746   NITRITE NEGATIVE 10/08/2011 0746   LEUKOCYTESUR NEGATIVE 10/08/2011 0746   Sepsis Labs: @LABRCNTIP (procalcitonin:4,lacticidven:4) )No results found for this or any previous visit (from the past 240 hour(s)).   Radiological Exams on Admission: Dg Foot Complete Right  Result Date: 04/21/2018 CLINICAL DATA:  Wound infection EXAM: RIGHT FOOT COMPLETE - 3+ VIEW COMPARISON:  04/19/2018 FINDINGS: Osseous mineralization normal. Displaced intra-articular fracture at base of middle phalanx second toe unchanged. Joint spaces otherwise preserved. Plantar calcaneal spur. Large plantar ulcer at heel. No acute fracture, dislocation, or bone destruction. IMPRESSION: Large plantar calcaneal ulcer without acute bony abnormalities; if there is persistent clinical concern for osteomyelitis recommend MR assessment. Plantar  calcaneal spurring. Displaced intra-articular fracture at base of middle phalanx RIGHT second toe. Electronically Signed   By: Ulyses SouthwardMark  Boles M.D.   On: 04/21/2018 14:57    EKG: Independently reviewed. Sinus tachycardia (rate 118).   Assessment/Plan  1. Diabetic foot infection  - Presents with 1 wk of malaise, several days of fever, and deep right heel ulcer with bullae and surrounding edema and erythema  - She is found to be febrile, tachycardic, and with leukocytosis but reassuringly normal lactate  - Plain films are negative for osteo, check MRI  - Check ABI, inflammatory markers, follow blood  cultures, continue empiric abx with vancomycin, Flagyl, and Rocephin    2. Insulin-dependent DM  - A1c was 12.6% remotely  - Managed at home with Lantus 80 units BID, Humalog 0-25 units TID, Victoza, Invokana, and metformin  - Check CBG's; pt is poor historian and will be started on  reduced-dose Lantus and SSI in the hospital to avoid hypoglycemia and adjust as needed    3. Mild renal insufficiency  - SCr is 1.07 on admission; was 0.6 on remote priors  - Possibly acute in setting of acute infection  - Gentle IVF hydration overnight, renally-dose medications, repeat chem panel in am    4. Normocytic anemia  - Hgb is 10.0 on admission; was 13.2 remotely with no recent labs available  - No bleeding, likely d/t chronic disease, check anemia panel   5. Hypertension  - BP at goal  - Continue Norvasc and Lopressor    6. Depression with anxiety  - Continue Cymbalta, Wellbutrin, Latuda, and Seroquel    7. COPD  - No wheezes on admission, breathing comfortably on rm air  - She reports recent increase in chronic SOB, will check CXR and continue ICS/LABA with prn albuterol for now    DVT prophylaxis: SCD's Code Status: Full  Family Communication: Discussed with patient  Consults called: None Admission status: Inpatient     Briscoe Deutscher, MD [facility] Pager [phone]  If  7PM-7AM, please contact night-coverage [email or web address] Password [facility]  04/21/2018, 7:59 PM

## 2018-04-21 NOTE — ED Notes (Signed)
Informed Clark - RN of pt's temp. 

## 2018-04-21 NOTE — ED Notes (Signed)
Vancomycin not available to get started, talked to pharmacy and they are going to send it to unit

## 2018-04-21 NOTE — Progress Notes (Signed)
Pharmacy Antibiotic Note  [name] is a 57 y.o. female admitted on 04/21/2018 with foot wound. She was started on oral doxy a few days ago and was referred to ED from wound care clinic. Planning to start broad spectrum antibiotics. LA wnl. SCr 1.   Plan: -Vancomycin 2500 mg IV x1 then 1g/12h -Zosyn 3.375 g IV q8h -Monitor renal fx, cultures, VT at Css    Temp (24hrs), Avg:100.8 F (38.2 C), Min:100.7 F (38.2 C), Max:100.8 F (38.2 C)  Recent Labs  Lab 04/21/18 1350  LATICACIDVEN 1.83     Antimicrobials this admission: 7/18 vancomycin > 7/18 zosyn >  Dose adjustments this admission: N/A  Microbiology results: 7/18 blood cx:   [name], [name] 04/21/2018 5:48 PM

## 2018-04-21 NOTE — ED Triage Notes (Signed)
Patient sent to ED by wound clinic for wound on bottom of right foot, patient states she was told she may need debridement. Patient first noticed wound two days ago. Was seen at [facility] ED two [facility], sent home with PO doxycycline. Patient anxious during triage.

## 2018-04-21 NOTE — ED Provider Notes (Signed)
Patient placed in Quick Look pathway, seen and evaluated   Chief Complaint: wound infection  HPI:   [name] is a 57 y.o. morbidly obese female who presents to the ED with right foot wound infection. Patient sent to ED by wound clinic for wound on bottom of right foot, patient states she was told she may need debridement. Patient first noticed wound two days ago. [facility], sent home with PO doxycycline. Patient anxious during triage. Dr. Jeraldine LootsLockwood was contacted by the wound center with concern and requesting admission for the patient.   ROS: Skin: wound infection right foot.   Physical Exam:  BP (!) 145/95 (BP Location: Left Wrist)   Pulse (!) 121   Temp (!) 100.8 F (38.2 C) (Oral)   Resp (!) 22   SpO2 99%    Gen: No distress  Neuro: Awake and Alert  Skin: wound infection right foot  Heart: tachycardic  Initiation of care has begun. The patient has been counseled on the process, plan, and necessity for staying for the completion/evaluation, and the remainder of the medical screening examination    Janne Napoleoneese, Ryott Rafferty M, NP 04/21/18 1408    Gerhard MunchLockwood, Robert, MD 04/22/18 [id number]

## 2018-04-22 ENCOUNTER — Inpatient Hospital Stay (HOSPITAL_COMMUNITY): Payer: Medicare Other

## 2018-04-22 ENCOUNTER — Other Ambulatory Visit: Payer: Self-pay

## 2018-04-22 ENCOUNTER — Encounter (HOSPITAL_COMMUNITY): Payer: Self-pay | Admitting: General Practice

## 2018-04-22 DIAGNOSIS — R0602 Shortness of breath: Secondary | ICD-10-CM

## 2018-04-22 DIAGNOSIS — L0390 Cellulitis, unspecified: Secondary | ICD-10-CM

## 2018-04-22 LAB — CBC WITH DIFFERENTIAL/PLATELET
Abs Immature Granulocytes: 0.2 10*3/uL — ABNORMAL HIGH (ref 0.0–0.1)
BASOS ABS: 0.1 10*3/uL (ref 0.0–0.1)
Basophils Relative: 1 %
EOS ABS: 0.3 10*3/uL (ref 0.0–0.7)
EOS PCT: 2 %
HEMATOCRIT: 30.2 % — AB (ref 36.0–46.0)
HEMOGLOBIN: 9 g/dL — AB (ref 12.0–15.0)
Immature Granulocytes: 1 %
LYMPHS ABS: 3 10*3/uL (ref 0.7–4.0)
LYMPHS PCT: 23 %
MCH: 24.4 pg — ABNORMAL LOW (ref 26.0–34.0)
MCHC: 29.8 g/dL — AB (ref 30.0–36.0)
MCV: 81.8 fL (ref 78.0–100.0)
MONO ABS: 1.1 10*3/uL — AB (ref 0.1–1.0)
Monocytes Relative: 8 %
Neutro Abs: 8.4 10*3/uL — ABNORMAL HIGH (ref 1.7–7.7)
Neutrophils Relative %: 65 %
Platelets: 280 10*3/uL (ref 150–400)
RBC: 3.69 MIL/uL — AB (ref 3.87–5.11)
RDW: 17 % — AB (ref 11.5–15.5)
WBC: 12.9 10*3/uL — AB (ref 4.0–10.5)

## 2018-04-22 LAB — IRON AND TIBC
Iron: 33 ug/dL (ref 28–170)
SATURATION RATIOS: 14 % (ref 10.4–31.8)
TIBC: 234 ug/dL — AB (ref 250–450)
UIBC: 201 ug/dL

## 2018-04-22 LAB — GLUCOSE, CAPILLARY
GLUCOSE-CAPILLARY: 147 mg/dL — AB (ref 70–99)
GLUCOSE-CAPILLARY: 171 mg/dL — AB (ref 70–99)
GLUCOSE-CAPILLARY: 177 mg/dL — AB (ref 70–99)
GLUCOSE-CAPILLARY: 218 mg/dL — AB (ref 70–99)
GLUCOSE-CAPILLARY: 271 mg/dL — AB (ref 70–99)
Glucose-Capillary: 165 mg/dL — ABNORMAL HIGH (ref 70–99)

## 2018-04-22 LAB — FOLATE: Folate: 9.2 ng/mL (ref >5.9)

## 2018-04-22 LAB — BASIC METABOLIC PANEL
Anion gap: 10 (ref 5–15)
BUN: 13 mg/dL (ref 6–20)
CO2: 25 mmol/L (ref 22–32)
CREATININE: 1.23 mg/dL — AB (ref 0.44–1.00)
Calcium: 8.3 mg/dL — ABNORMAL LOW (ref 8.9–10.3)
Chloride: 103 mmol/L (ref 98–111)
GFR calc Af Amer: 56 mL/min — ABNORMAL LOW (ref >60)
GFR, EST NON AFRICAN AMERICAN: 48 mL/min — AB (ref >60)
GLUCOSE: 173 mg/dL — AB (ref 70–99)
POTASSIUM: 3.5 mmol/L (ref 3.5–5.1)
SODIUM: 138 mmol/L (ref 135–145)

## 2018-04-22 LAB — RETICULOCYTES
RBC.: 3.69 MIL/uL — AB (ref 3.87–5.11)
RETIC CT PCT: 0.7 % (ref 0.4–3.1)
Retic Count, Absolute: 25.8 10*3/uL (ref 19.0–186.0)

## 2018-04-22 LAB — VITAMIN B12: Vitamin B-12: 287 pg/mL (ref 180–914)

## 2018-04-22 LAB — C-REACTIVE PROTEIN: CRP: 19.6 mg/dL — ABNORMAL HIGH (ref <1.0)

## 2018-04-22 LAB — HIV ANTIBODY (ROUTINE TESTING W REFLEX): HIV SCREEN 4TH GENERATION: NONREACTIVE

## 2018-04-22 LAB — FERRITIN: Ferritin: 88 ng/mL (ref 11–307)

## 2018-04-22 MED ORDER — GADOBENATE DIMEGLUMINE 529 MG/ML IV SOLN
20.0000 mL | Freq: Once | INTRAVENOUS | Status: AC | PRN
  Administered 2018-04-22: 20 mL via INTRAVENOUS

## 2018-04-22 MED ORDER — JUVEN PO PACK
1.0000 | PACK | Freq: Two times a day (BID) | ORAL | Status: DC
  Administered 2018-04-22 – 2018-04-27 (×10): 1 via ORAL
  Filled 2018-04-22 (×11): qty 1

## 2018-04-22 MED ORDER — COLLAGENASE 250 UNIT/GM EX OINT
TOPICAL_OINTMENT | Freq: Every day | CUTANEOUS | Status: DC
  Administered 2018-04-22 – 2018-04-27 (×6): via TOPICAL
  Filled 2018-04-22 (×3): qty 30

## 2018-04-22 MED ORDER — GLUCERNA SHAKE PO LIQD
237.0000 mL | Freq: Three times a day (TID) | ORAL | Status: DC
  Administered 2018-04-22 – 2018-04-25 (×6): 237 mL via ORAL

## 2018-04-22 NOTE — Progress Notes (Signed)
VASCULAR LAB PRELIMINARY  ARTERIAL  ABI completed:    RIGHT    LEFT    PRESSURE WAVEFORM  PRESSURE WAVEFORM  BRACHIAL 166 Triphasic BRACHIAL 171 Triphasic  DP 141 Biphasic DP 175 Triphasic  AT   AT    PT 163  PT 177 Triphasic  PER   PER    GREAT TOE 108 NA GREAT TOE  Unable to obtain due to wound    RIGHT LEFT  ABI 0.95 1.04   Bilateral ABI's are normal at rest.   Aundra MilletMegan E Shawntavia Saunders 04/22/2018, 10:06 AM

## 2018-04-22 NOTE — Progress Notes (Signed)
PROGRESS NOTE    [name]  ZOX:[id number] DOB: 01-03-1961 DOA: 04/21/2018 PCP: Wilmer Floor., MD    Brief Narrative:  57 year old female who presented with right foot painful wound, associated with fever and malaise.  She does have significant past medical history for obesity, type 2 diabetes mellitus, hypertension, depression, and anxiety.  Reported 7 days of malaise and subjective fevers, 3 days ago she realized had a large ulcer at the right heel, she was seen in the outpatient setting, had doxycycline prescribed, with no improvement of her symptoms.  The initial physical examination blood pressure 145/58, heart rate 106, respiratory rate 15, oxygen saturation 98%.  Moist mucous membranes, lungs clear to auscultation, heart S1-S2 present and rhythmic, abdomen protuberant nontender, right foot with edema, erythema, large ulcerated lesion at the heel with necrotic base, erythema in the inner ankle.  Sodium 136, potassium 4.5, chloride 101, bicarb 21, glucose 128, BUN 10, creatinine 1.0, CRP 21.2, white count 14.2, hemoglobin 10.0, hematocrit 34.4, platelets 291.  Right foot radiograph large plantar calcaneal ulcer without acute bony abnormalities.  Displaced intra-articular fracture at base of middle phalanx right second toe.  Chest x-ray negative for infiltrates.  EKG normal sinus rhythm, normal axis.  Patient was admitted to the hospital with working diagnosis of diabetic right foot ulcer, failed outpatient therapy.    Assessment & Plan:   Principal Problem:   Diabetic foot infection (HCC) Active Problems:   Insulin-requiring or dependent type II diabetes mellitus (HCC)   Depression with anxiety   Essential hypertension   Normocytic anemia   Mild renal insufficiency   Sepsis due to cellulitis (HCC)   1. Right foot ulcer, diabetic foot complicated with sepsis. Will continue antibiotic therapy with Ceftriaxone, vancomycin and metronidzole, will follow on response, of no  significant improving will consult surgery for possible debridement, no oste per MRI. Out of bed as tolerated.  2. HTN. Blood pressure continue to be stable with amlodipine and metoprolol.  3. T2DM. Will continue glucose cover and monitoring with insulin sliding scale, basal insulin with lantus 40 units bid. Will advance diet. Capillary glucose 171, 177, 147, 165, 218.   4. Morbid obesity. Will need outpatient follow up.   5. Depression. Continue bupropion, duloxetine, seroquel and latuda.   DVT prophylaxis: enoxaparin   Code Status:  full Family Communication: no family at the beside  Disposition Plan/ discharge barriers: pending clinical improvement.    Consultants:     Procedures:     Antimicrobials:   Ceftriaxone  Metronidazole  Vancomycin     Subjective: Patient with persistent pain at the right foot moderate in intensity, worse with ambulation, no nausea or vomiting, no fever or chills.   Objective: Vitals:   04/21/18 2030 04/21/18 2115 04/22/18 0418 04/22/18 0842  BP: 134/82 (!) 163/61 (!) 128/54   Pulse: 98 98 73   Resp: 17 18 18    Temp:  99.1 F (37.3 C) 97.7 F (36.5 C)   TempSrc:  Oral Oral   SpO2: 95% 97% 94% 92%  Weight:        Intake/Output Summary (Last 24 hours) at 04/22/2018 1248 Last data filed at 04/22/2018 0600 Gross per 24 hour  Intake 806.27 ml  Output -  Net 806.27 ml   Filed Weights   04/21/18 1715  Weight: (!) 162.8 kg (359 lb)    Examination:   General: Not in pain or dyspnea, deconditioned  Neurology: Awake and alert, non focal  E ENT: mild pallor, no icterus,  oral mucosa moist Cardiovascular: No JVD. S1-S2 present, rhythmic, no gallops, rubs, or murmurs. No lower extremity edema. Pulmonary: decreased breath sounds bilaterally, no wheezing, rhonchi or rales. Gastrointestinal. Abdomen flat, no organomegaly, non tender, no rebound or guarding Skin. Right foot edema with inferior and bilateral ankle erythema, tender to  palpation, large stage 3 ulcer at the heal with medial purulent lesions.  Musculoskeletal: no joint deformities     Data Reviewed: I have personally reviewed following labs and imaging studies  CBC: Recent Labs  Lab 04/21/18 1706 04/22/18 0331  WBC 14.2* 12.9*  NEUTROABS 11.3* 8.4*  HGB 10.0* 9.0*  HCT 34.4* 30.2*  MCV 83.3 81.8  PLT 291 280   Basic Metabolic Panel: Recent Labs  Lab 04/21/18 1706 04/22/18 0331  NA 136 138  K 4.5 3.5  CL 101 103  CO2 21* 25  GLUCOSE 128* 173*  BUN 10 13  CREATININE 1.07* 1.23*  CALCIUM 8.7* 8.3*   GFR: CrCl cannot be calculated (Unknown ideal weight.). Liver Function Tests: No results for input(s): AST, ALT, ALKPHOS, BILITOT, PROT, ALBUMIN in the last 168 hours. No results for input(s): LIPASE, AMYLASE in the last 168 hours. No results for input(s): AMMONIA in the last 168 hours. Coagulation Profile: No results for input(s): INR, PROTIME in the last 168 hours. Cardiac Enzymes: No results for input(s): CKTOTAL, CKMB, CKMBINDEX, TROPONINI in the last 168 hours. BNP (last 3 results) No results for input(s): PROBNP in the last 8760 hours. HbA1C: Recent Labs    04/21/18 2146  HGBA1C 8.3*   CBG: Recent Labs  Lab 04/21/18 2113 04/22/18 0002 04/22/18 0415 04/22/18 0759 04/22/18 1241  GLUCAP 107* 171* 177* 147* 165*   Lipid Profile: No results for input(s): CHOL, HDL, LDLCALC, TRIG, CHOLHDL, LDLDIRECT in the last 72 hours. Thyroid Function Tests: No results for input(s): TSH, T4TOTAL, FREET4, T3FREE, THYROIDAB in the last 72 hours. Anemia Panel: Recent Labs    04/22/18 0331  VITAMINB12 287  FOLATE 9.2  FERRITIN 88  TIBC 234*  IRON 33  RETICCTPCT 0.7      Radiology Studies: I have reviewed all of the imaging during this hospital visit personally     Scheduled Meds: . amLODipine  5 mg Oral Daily  . buPROPion  200 mg Oral BID  . collagenase   Topical Daily  . DULoxetine  60 mg Oral Daily  . insulin aspart   0-9 Units Subcutaneous Q4H  . insulin glargine  40 Units Subcutaneous BID  . lurasidone  20 mg Oral QHS  . metoprolol tartrate  50 mg Oral BID  . mometasone-formoterol  2 puff Inhalation BID  . pantoprazole  40 mg Oral Daily  . pregabalin  675 mg Oral QHS  . QUEtiapine  100 mg Oral QHS  . sodium chloride flush  3 mL Intravenous Q12H   Continuous Infusions: . sodium chloride    . cefTRIAXone (ROCEPHIN)  IV Stopped (04/21/18 2239)  . metronidazole 100 mL/hr at 04/22/18 0600  . vancomycin 1,000 mg (04/22/18 1018)     LOS: 1 day        Mauricio Annett Gulaaniel Arrien, MD [facility] Pager [phone]

## 2018-04-22 NOTE — Social Work (Signed)
CSW acknowledging consult for "Access Meds for Discharge." For medication access or consults please refer to RN Case Management.  Of note pt does have insurance coverage, unfortunately [facility].   CSW signing off. Please consult if any additional needs arise.  Doy HutchingIsabel H Vernell Back, LCSWA [facility] Clinical Social Work [phone]

## 2018-04-22 NOTE — Progress Notes (Signed)
PROGRESS NOTE  [name] ZOX:[id number] DOB: 02-Sep-1961 DOA: 04/21/2018 PCP: Wilmer Floorampbell, [name] D., MD   Brief Narrative:  [name] is a 57 y.o. female with a past medical history significant for diabetes mellitus, hypertension, depression/anxiety, morbid obesity, COPD, Hyperlipidemia, and chronic left toe ulcer. She presented to the emergency department on 7/18 complaining of fevers, malaise and right foot ulcer with surrounding erythema, swelling and drainage. She had malaise x1 week and subjective fevers x4 days, but only noticed the ulcer about 2 days ago in the shower while washing her feet. [facility] and was discharged on doxycycline. Patient followed up with wound care yesterday and was sent to the ED for concern of sepsis and osteomyelitis.   In the emergency department, her vitals were BP 152/99, HR 103, RR 15, Temp 100.7, SpO2 94%. She was found to have 8 cm x 5 cm ulceration on the plantar aspect of her right foot with cellulitis extending up the medial and lateral aspects of the foot. . Pertinent lab values: WBC 14.2, Hb 10.0 sCr 1.07, A1C 8.3, Sed Rate 105, CRP 21.2, Prealbumin 8.8. Xray of right foot was negative for osteomyelitis with a intra-articular fracture of the right middle phalanx of the 2nd toe.EKG showed sinus tachycardia.  Lactic acid was not elevated. Blood cultures were collected. The patient was treated with tylenol, dilaudid, vancomycin and zosyn in the ED. She was admitted with the diagnosis of infected diabetic foot ulcer.   Subjective: Patient is complaining of of 7/8 pain in her right foot that is fairly constant, extending about 1/2 way up her calf - pain is about the same as last night. She has occasional sharp pain but is not precipitated by anything such as movement or palpation. She has numbness in bilateral lower extremities at baseline. She describes her foot as warm to the touch and more edematous than the left. She reports that she slept  quite well, but felt quite hot - no fevers. She did have some nausea and vomiting yesterday, but this has resolved. She believes this was from not eating. No CP, palpitations, diarrhea or cough. She does endorse some increased dyspnea from her baseline.  Assessment & Plan: Acute infected diabetic foot ulcer - Clinically improving: no fevers today, no further tachycardia. Leukocytosis is trending down at 12.9 (14.2 yesterday). Inflammatory markers decreasing. Continue to monitor daily CBC. - Plain films negative for osteomyelitis. MRI shows a large heel ulcer with cellulitis and myofasciitis but no discrete drainable soft tissue abscess, pyomyositis, septic arthritis or osteomyelitis. - Wound care consult - recommend considering ortho consult for possible surgical debridement. Holding off - Continue IV antibiotics - vancomycin, flagyl, and rocephin. - Follow Blood Cultures - no growth to date.  Diabetes Mellitus  - A1C 8.3% -  Home regimen Lantus 80 units BID, Humalog 0-25 units TID, Victoza, Invokana, and metformin  - Closely monitor CBG's. Started on 40 units lantus BID and sliding scale insulin.   Acute kidney injury  - sCr increased today at 1.23 from 1.07. Holding oral hypoglycemics and avoiding any nephrotoxic agents. -  Monitor with daily BMP. Gentle IVF.  COPD  - Patient states her dyspnea is worse than baseline. No wheezes or coughing. Continue home regimen of dulera BID and albuterol prn.  Anemia  - Hgb is 9.0 today, baseline appear to be about 13. No signs of bleeding. Likely due to chronic disease. Anemia panel unremarkable. Monitor with CBC  Hypertension  - Stable. Continue  Norvasc 5mg  and Lopressor 50 mg.  Depression/Anxiety - Continue Cymbalta 60 mg, Wellbutrin 200 BID, Latuda 20mg  and Seroquel 100mg .   DVT prophylaxis: SCD's Code Status: Full  Family Communication: None at bedside  Disposition Plan: Likely 2-3 days depending on response to IV  antibiotics  Consultants:   Wound Care  Procedures:   None  Antimicrobials:   Zosyn - discontinued.   Vancomycin, Flagyl, Rocephin    Objective: Vitals:   04/21/18 2030 04/21/18 2115 04/22/18 0418 04/22/18 0842  BP: 134/82 (!) 163/61 (!) 128/54   Pulse: 98 98 73   Resp: 17 18 18    Temp:  99.1 F (37.3 C) 97.7 F (36.5 C)   TempSrc:  Oral Oral   SpO2: 95% 97% 94% 92%  Weight:        Intake/Output Summary (Last 24 hours) at 04/22/2018 1022 Last data filed at 04/22/2018 0600 Gross per 24 hour  Intake 806.27 ml  Output -  Net 806.27 ml   Filed Weights   04/21/18 1715  Weight: (!) 162.8 kg (359 lb)    Examination: General appearance: obese adult female, awake and alert. NAD.   HEENT: Anicteric, conjunctiva pink, lids and lashes normal. No nasal deformity, discharge, epistaxis. Skin: 8 x 5 cm ulcer on the plantar aspect of the right foot (calcaneal region). There is surrounding erythema, edema and warmth that extends up both the medial and lateral aspect of the foot - erythema spread slightly past pen line.  Cardiac: RRR, nl S1-S2, no murmurs appreciated. No JVD. Distal pulses are intact bilaterally. Respiratory: Normal respiratory rate and rhythm. Decreased breath sounds throughout. No wheezes, crackles, or rales noted.  Abdomen: Abdomen soft and non-distended. Mild TTP epigastric. Positive bowel sounds throughout. Neuro: AOx4. Moves all extremities. Speech fluent.    Psych: Sensorium intact and responding to questions, attention normal. Affect normal. Judgment and insight appear normal.  Data Reviewed: I have personally reviewed following labs and imaging studies:  CBC: Recent Labs  Lab 04/21/18 1706 04/22/18 0331  WBC 14.2* 12.9*  NEUTROABS 11.3* 8.4*  HGB 10.0* 9.0*  HCT 34.4* 30.2*  MCV 83.3 81.8  PLT 291 280   Basic Metabolic Panel: Recent Labs  Lab 04/21/18 1706 04/22/18 0331  NA 136 138  K 4.5 3.5  CL 101 103  CO2 21* 25  GLUCOSE 128* 173*   BUN 10 13  CREATININE 1.07* 1.23*  CALCIUM 8.7* 8.3*   HbA1C: Recent Labs    04/21/18 2146  HGBA1C 8.3*   CBG: Recent Labs  Lab 04/21/18 1624 04/21/18 2113 04/22/18 0002 04/22/18 0415 04/22/18 0759  GLUCAP 98 107* 171* 177* 147*   Anemia Panel: Recent Labs    04/22/18 0331  VITAMINB12 287  FOLATE 9.2  FERRITIN 88  TIBC 234*  IRON 33  RETICCTPCT 0.7   Urine analysis:    Component Value Date/Time   COLORURINE YELLOW 10/08/2011 0746   APPEARANCEUR CLEAR 10/08/2011 0746   LABSPEC 1.038 (H) 10/08/2011 0746   PHURINE 5.5 10/08/2011 0746   GLUCOSEU >1000 (A) 10/08/2011 0746   GLUCOSEU >=1000 03/11/2011 1504   HGBUR SMALL (A) 10/08/2011 0746   BILIRUBINUR MODERATE (A) 10/08/2011 0746   KETONESUR >80 (A) 10/08/2011 0746   PROTEINUR NEGATIVE 10/08/2011 0746   UROBILINOGEN 0.2 10/08/2011 0746   NITRITE NEGATIVE 10/08/2011 0746   LEUKOCYTESUR NEGATIVE 10/08/2011 0746   Recent Results (from the past 240 hour(s))  Culture, blood (Routine X 2) w Reflex to ID Panel     Status: None (  Preliminary result)   Collection Time: 04/21/18  5:48 PM  Result Value Ref Range Status   Specimen Description BLOOD RIGHT ANTECUBITAL  Final   Special Requests   Final    BOTTLES DRAWN AEROBIC AND ANAEROBIC Blood Culture adequate volume   Culture   Final    NO GROWTH < 24 HOURS Performed at [facility], [address], [place], [place] [address]    Report Status PENDING  Incomplete  Culture, blood (Routine X 2) w Reflex to ID Panel     Status: None (Preliminary result)   Collection Time: 04/21/18  5:53 PM  Result Value Ref Range Status   Specimen Description BLOOD RIGHT ANTECUBITAL  Final   Special Requests   Final    BOTTLES DRAWN AEROBIC AND ANAEROBIC Blood Culture adequate volume   Culture   Final    NO GROWTH < 24 HOURS Performed at [facility], [address] [place], [place] [address]    Report Status PENDING  Incomplete    Radiology Studies: Dg Chest Port 1  View  Result Date: 04/21/2018 CLINICAL DATA:  Shortness of breath EXAM: PORTABLE CHEST 1 VIEW COMPARISON:  01/04/2017 FINDINGS: The heart size and mediastinal contours are within normal limits. Both lungs are clear. The visualized skeletal structures are unremarkable. IMPRESSION: No active disease. Electronically Signed   By: Deatra Robinson M.D.   On: 04/21/2018 20:59   Dg Foot Complete Right  Result Date: 04/21/2018 CLINICAL DATA:  Wound infection EXAM: RIGHT FOOT COMPLETE - 3+ VIEW COMPARISON:  04/19/2018 FINDINGS: Osseous mineralization normal. Displaced intra-articular fracture at base of middle phalanx second toe unchanged. Joint spaces otherwise preserved. Plantar calcaneal spur. Large plantar ulcer at heel. No acute fracture, dislocation, or bone destruction. IMPRESSION: Large plantar calcaneal ulcer without acute bony abnormalities; if there is persistent clinical concern for osteomyelitis recommend MR assessment. Plantar calcaneal spurring. Displaced intra-articular fracture at base of middle phalanx RIGHT second toe. Electronically Signed   By: Ulyses Southward M.D.   On: 04/21/2018 14:57   Scheduled Meds: . amLODipine  5 mg Oral Daily  . buPROPion  200 mg Oral BID  . collagenase   Topical Daily  . DULoxetine  60 mg Oral Daily  . insulin aspart  0-9 Units Subcutaneous Q4H  . insulin glargine  40 Units Subcutaneous BID  . lurasidone  20 mg Oral QHS  . metoprolol tartrate  50 mg Oral BID  . mometasone-formoterol  2 puff Inhalation BID  . pantoprazole  40 mg Oral Daily  . pregabalin  675 mg Oral QHS  . QUEtiapine  100 mg Oral QHS  . sodium chloride flush  3 mL Intravenous Q12H   Continuous Infusions: . sodium chloride    . cefTRIAXone (ROCEPHIN)  IV Stopped (04/21/18 2239)  . metronidazole 100 mL/hr at 04/22/18 0600  . vancomycin      LOS: 1 day   Time spent: 20 minutes spent with the patient.  Kadarius Cuffe, PA-S [facility] 04/22/2018, 10:22 AM    [email or web address] Password [email or web address] If 7PM-7AM, please contact night-coverage

## 2018-04-22 NOTE — Progress Notes (Addendum)
Initial Nutrition Assessment  DOCUMENTATION CODES:   Morbid obesity  INTERVENTION:    Glucerna Shake po TID, each supplement provides 220 kcal and 10 grams of protein  Juven Fruit Punch BID, each serving provides 95kcal and 2.5g of protein (amino acids glutamine and arginine)  NUTRITION DIAGNOSIS:   Increased nutrient needs related to wound healing as evidenced by estimated needs.  GOAL:   Patient will meet greater than or equal to 90% of their needs  MONITOR:   PO intake, Supplement acceptance, Weight trends, Labs  REASON FOR ASSESSMENT:   Consult Wound healing  ASSESSMENT:   Patient with PMH significant for DM, HTN, depression, anxiety, and chronic left great toe ulcer. Presents this admission with complaints of fevers, malaise, and right foot ulcer. Admitted for diabetic foot infection.    Pt reports having a decrease in PO intake over the last 1-2 weeks due to ongoing fatigue and loss of appetite from antibiotics. During this time period she consumed one meal/day that consisted of easy to make meals like rice packets, tv dinners, and prepackaged foods. She does not use supplementation at home. Appetite has been average this admission per her reports. She was provided with cod, potatoes, and a vegetable today and consumed 100%. Discussed the importance of protein intake to aid with wound healing. Pt amendable to Glucerna and Juven.   Pt endorses a recent wt gain. She is unsure of her UBW. Records are limited in weight history. Will monitor trends this stay. Nutrition-Focused physical exam completed.   Medications reviewed.  Labs reviewed.   NUTRITION - FOCUSED PHYSICAL EXAM:    Most Recent Value  Orbital Region  No depletion  Upper Arm Region  No depletion  Thoracic and Lumbar Region  Unable to assess  Buccal Region  No depletion  Temple Region  No depletion  Clavicle Bone Region  No depletion  Clavicle and Acromion Bone Region  No depletion  Scapular Bone Region   Unable to assess  Dorsal Hand  No depletion  Patellar Region  No depletion  Anterior Thigh Region  No depletion  Posterior Calf Region  No depletion  Edema (RD Assessment)  Mild  Hair  Reviewed  Eyes  Reviewed  Mouth  Reviewed  Skin  Reviewed  Nails  Reviewed     Diet Order:   Diet Order           Diet Carb Modified Fluid consistency: Thin; Room service appropriate? Yes  Diet effective now          EDUCATION NEEDS:   Education needs have been addressed  Skin:  Skin Assessment: Skin Integrity Issues: Skin Integrity Issues:: Diabetic Ulcer Diabetic Ulcer: right plantar full thickness  Last BM:  04/21/18  Height:   Ht Readings from Last 1 Encounters:  04/22/18 5\' 7"  (1.702 m)    Weight:   Wt Readings from Last 1 Encounters:  04/21/18 (!) 359 lb (162.8 kg)    Ideal Body Weight:  61.4 kg  BMI:  Body mass index is 56.23 kg/m.  Estimated Nutritional Needs:   Kcal:  2200-2400 (MSJ)  Protein:  110-120 grams  Fluid:  >2.2 L/day    Vanessa Kickarly Dierdre Mccalip RD, LDN Clinical Nutrition Pager # - [phone]

## 2018-04-22 NOTE — Progress Notes (Signed)
Pt arrived to room via bed stretcher. Transferred self to bed by sliding over. Oriented patient to room, applied SCD's and made patient aware of [facility] policies. Vital signs stable at this time

## 2018-04-22 NOTE — Consult Note (Addendum)
WOC Nurse wound consult note Reason for Consult: Consult requested for right foot wound.  Reviewed photos in the EMR.  Right plantar foot is 100% tightly adhered eschar, surrounded to anterior and outer foot by generalized erythremia to ankle area and loose peeling macerated skin.  MRI is pending to R/O osteomylitis. Wound type: Full thickness Dressing procedure/placement/frequency: Santyl ordered for enzymatic debridement of nonviable tissue. Please consider ortho consult for possible surgical debridement of right foot wound.   Please re-consult if further assistance is needed.  Thank-you,  Cammie Mcgeeawn Kimya Mccahill MSN, RN, CWOCN, SmolanWCN-AP, CNS [phone]

## 2018-04-22 NOTE — Progress Notes (Signed)
Inpatient Diabetes Program Recommendations  AACE/ADA: New Consensus Statement on Inpatient Glycemic Control (2015)  Target Ranges:  Prepandial:   less than 140 mg/dL      Peak postprandial:   less than 180 mg/dL (1-2 hours)      Critically ill patients:  140 - 180 mg/dL   Lab Results  Component Value Date   GLUCAP 147 (H) 04/22/2018   HGBA1C 8.3 (H) 04/21/2018    Review of Glycemic Control  Diabetes history: DM2 Outpatient Diabetes medications: Lantus 80 units BID, Humalog 0-25 units TID, Victoza, Invokana, and metformin  Current orders for Inpatient glycemic control: Lantus 40 units bid + Novolog sensitive correction q 4 hrs  Inpatient Diabetes Program Recommendations:  Received consult regarding lower extremity wound. Agree with treatment plan.  Thank you, [name] FischerJudy E. Kalianne Fetting, RN, MSN, CDE  Diabetes Coordinator Inpatient Glycemic Control Team Team Pager [phone] (8am-5pm) 04/22/2018 11:08 AM

## 2018-04-23 LAB — BASIC METABOLIC PANEL
ANION GAP: 7 (ref 5–15)
BUN: 15 mg/dL (ref 6–20)
CO2: 26 mmol/L (ref 22–32)
Calcium: 8.8 mg/dL — ABNORMAL LOW (ref 8.9–10.3)
Chloride: 106 mmol/L (ref 98–111)
Creatinine, Ser: 1.01 mg/dL — ABNORMAL HIGH (ref 0.44–1.00)
GFR calc non Af Amer: >60 mL/min (ref >60)
GLUCOSE: 181 mg/dL — AB (ref 70–99)
POTASSIUM: 3.9 mmol/L (ref 3.5–5.1)
Sodium: 139 mmol/L (ref 135–145)

## 2018-04-23 LAB — CBC
HEMATOCRIT: 28.6 % — AB (ref 36.0–46.0)
Hemoglobin: 8.6 g/dL — ABNORMAL LOW (ref 12.0–15.0)
MCH: 24.5 pg — ABNORMAL LOW (ref 26.0–34.0)
MCHC: 30.1 g/dL (ref 30.0–36.0)
MCV: 81.5 fL (ref 78.0–100.0)
Platelets: 304 10*3/uL (ref 150–400)
RBC: 3.51 MIL/uL — AB (ref 3.87–5.11)
RDW: 16.9 % — AB (ref 11.5–15.5)
WBC: 14 10*3/uL — ABNORMAL HIGH (ref 4.0–10.5)

## 2018-04-23 LAB — GLUCOSE, CAPILLARY
GLUCOSE-CAPILLARY: 170 mg/dL — AB (ref 70–99)
GLUCOSE-CAPILLARY: 230 mg/dL — AB (ref 70–99)
GLUCOSE-CAPILLARY: 263 mg/dL — AB (ref 70–99)
GLUCOSE-CAPILLARY: 301 mg/dL — AB (ref 70–99)
Glucose-Capillary: 176 mg/dL — ABNORMAL HIGH (ref 70–99)
Glucose-Capillary: 261 mg/dL — ABNORMAL HIGH (ref 70–99)
Glucose-Capillary: 263 mg/dL — ABNORMAL HIGH (ref 70–99)

## 2018-04-23 LAB — C-REACTIVE PROTEIN: CRP: 18.3 mg/dL — ABNORMAL HIGH (ref <1.0)

## 2018-04-23 MED ORDER — CEFAZOLIN SODIUM-DEXTROSE 2-4 GM/100ML-% IV SOLN
2.0000 g | Freq: Three times a day (TID) | INTRAVENOUS | Status: DC
  Administered 2018-04-23 – 2018-04-24 (×3): 2 g via INTRAVENOUS
  Filled 2018-04-23 (×4): qty 100

## 2018-04-23 NOTE — Progress Notes (Signed)
PROGRESS NOTE    [name]  ZOX:[id number] DOB: March 07, 1961 DOA: 04/21/2018 PCP: Wilmer Floor., MD    Brief Narrative:  57 year old female who presented with right foot painful wound, associated with fever and malaise.  She does have significant past medical history for obesity, type 2 diabetes mellitus, hypertension, depression, and anxiety.  Reported 7 days of malaise and subjective fevers, 3 days ago she realized had a large ulcer at the right heel, she was seen in the outpatient setting, had doxycycline prescribed, with no improvement of her symptoms.  The initial physical examination blood pressure 145/58, heart rate 106, respiratory rate 15, oxygen saturation 98%.  Moist mucous membranes, lungs clear to auscultation, heart S1-S2 present and rhythmic, abdomen protuberant nontender, right foot with edema, erythema, large ulcerated lesion at the heel with necrotic base, erythema in the inner ankle.  Sodium 136, potassium 4.5, chloride 101, bicarb 21, glucose 128, BUN 10, creatinine 1.0, CRP 21.2, white count 14.2, hemoglobin 10.0, hematocrit 34.4, platelets 291.  Right foot radiograph large plantar calcaneal ulcer without acute bony abnormalities.  Displaced intra-articular fracture at base of middle phalanx right second toe.  Chest x-ray negative for infiltrates.  EKG normal sinus rhythm, normal axis.  Patient was admitted to the hospital with working diagnosis of diabetic right foot ulcer, failed outpatient therapy.    Assessment & Plan:   Principal Problem:   Diabetic foot infection (HCC) Active Problems:   Insulin-requiring or dependent type II diabetes mellitus (HCC)   Depression with anxiety   Essential hypertension   Normocytic anemia   Mild renal insufficiency   Sepsis due to cellulitis (HCC)   1. Right foot ulcer, diabetic foot complicated with sepsis. Clinically has improved in terms of the erythema and edema, persistent necrotic tissue at the heal. WBC count stable  at 14, no fever, will de-escalate antibiotic therapy to Cefalexin and continue to follow on response. Consulted Dr. Carola Frost from orthopedics for possible debridement.   2. HTN. Continue amlodipine and metoprolol for blood pressure control.   3. T2DM.  Glucose cover and monitoring with insulin sliding scale plus basal insulin with lantus 40 units bid. Capillary glucose 218, 271, 301, 176, 170.   4. Morbid obesity. Out of bed as tolerated, will need close follow up. Follow nutrition recommendations, glucerna, and juven.    5. Depression. On bupropion, duloxetine, seroquel and latuda. No confusion or agitation.   DVT prophylaxis: enoxaparin   Code Status:  full Family Communication: no family at the beside  Disposition Plan/ discharge barriers: pending clinical improvement.    Consultants:     Procedures:     Antimicrobials:   Ceftriaxone  Metronidazole  Vancomycin     Subjective: Patient is feeling better, but not close to her baseline, no nausea or vomiting, no fever or chills, persistent pain at the right ankle.   Objective: Vitals:   04/22/18 1450 04/22/18 2019 04/22/18 2133 04/23/18 0503  BP:   (!) 166/66 (!) 150/70  Pulse:  87 91 80  Resp:  18  18  Temp:   98.6 F (37 C) 98.5 F (36.9 C)  TempSrc:   Oral Oral  SpO2:  96% 97% 92%  Weight:      Height: 5\' 7"  (1.702 m)       Intake/Output Summary (Last 24 hours) at 04/23/2018 1203 Last data filed at 04/23/2018 0600 Gross per 24 hour  Intake 1618.02 ml  Output 500 ml  Net 1118.02 ml   [employer]  04/21/18 1715  Weight: (!) 162.8 kg (359 lb)    Examination:   General: Not in pain or dyspnea, deconditioned Neurology: Awake and alert, non focal  E ENT: mil pallor, no icterus, oral mucosa moist Cardiovascular: No JVD. S1-S2 present, rhythmic, no gallops, rubs, or murmurs. No lower extremity edema. Pulmonary: positive breath sounds bilaterally, adequate air movement, no wheezing, rhonchi or  rales. Gastrointestinal. Abdomen protuberant with no organomegaly, non tender, no rebound or guarding Skin. Right foot the ankle erythema, ill defined margins, large oval stage 3 ulcer at the heal, de attached skin on the medial ankle.   Musculoskeletal: no joint deformities     Data Reviewed: I have personally reviewed following labs and imaging studies  CBC: Recent Labs  Lab 04/21/18 1706 04/22/18 0331 04/23/18 0625  WBC 14.2* 12.9* 14.0*  NEUTROABS 11.3* 8.4*  --   HGB 10.0* 9.0* 8.6*  HCT 34.4* 30.2* 28.6*  MCV 83.3 81.8 81.5  PLT 291 280 304   Basic Metabolic Panel: Recent Labs  Lab 04/21/18 1706 04/22/18 0331 04/23/18 0625  NA 136 138 139  K 4.5 3.5 3.9  CL 101 103 106  CO2 21* 25 26  GLUCOSE 128* 173* 181*  BUN 10 13 15   CREATININE 1.07* 1.23* 1.01*  CALCIUM 8.7* 8.3* 8.8*   GFR: Estimated Creatinine Clearance: 101.4 mL/min (A) (by C-G formula based on SCr of 1.01 mg/dL (H)). Liver Function Tests: No results for input(s): AST, ALT, ALKPHOS, BILITOT, PROT, ALBUMIN in the last 168 hours. No results for input(s): LIPASE, AMYLASE in the last 168 hours. No results for input(s): AMMONIA in the last 168 hours. Coagulation Profile: No results for input(s): INR, PROTIME in the last 168 hours. Cardiac Enzymes: No results for input(s): CKTOTAL, CKMB, CKMBINDEX, TROPONINI in the last 168 hours. BNP (last 3 results) No results for input(s): PROBNP in the last 8760 hours. HbA1C: Recent Labs    04/21/18 2146  HGBA1C 8.3*   CBG: Recent Labs  Lab 04/22/18 1615 04/22/18 1956 04/23/18 0039 04/23/18 0502 04/23/18 0740  GLUCAP 218* 271* 301* 176* 170*   Lipid Profile: No results for input(s): CHOL, HDL, LDLCALC, TRIG, CHOLHDL, LDLDIRECT in the last 72 hours. Thyroid Function Tests: No results for input(s): TSH, T4TOTAL, FREET4, T3FREE, THYROIDAB in the last 72 hours. Anemia Panel: Recent Labs    04/22/18 0331  VITAMINB12 287  FOLATE 9.2  FERRITIN 88    TIBC 234*  IRON 33  RETICCTPCT 0.7      Radiology Studies: I have reviewed all of the imaging during this hospital visit personally     Scheduled Meds: . amLODipine  5 mg Oral Daily  . buPROPion  200 mg Oral BID  . collagenase   Topical Daily  . DULoxetine  60 mg Oral Daily  . feeding supplement (GLUCERNA SHAKE)  237 mL Oral TID BM  . insulin aspart  0-9 Units Subcutaneous Q4H  . insulin glargine  40 Units Subcutaneous BID  . lurasidone  20 mg Oral QHS  . metoprolol tartrate  50 mg Oral BID  . mometasone-formoterol  2 puff Inhalation BID  . nutrition supplement (JUVEN)  1 packet Oral BID BM  . pantoprazole  40 mg Oral Daily  . pregabalin  675 mg Oral QHS  . QUEtiapine  100 mg Oral QHS  . sodium chloride flush  3 mL Intravenous Q12H   Continuous Infusions: . sodium chloride    . cefTRIAXone (ROCEPHIN)  IV Stopped (04/22/18 2233)  . metronidazole 500  mg (04/23/18 0529)  . vancomycin 1,000 mg (04/23/18 1059)     LOS: 2 days        Zlatan Hornback Annett Gula, MD [facility] Pager [phone]

## 2018-04-24 ENCOUNTER — Encounter (HOSPITAL_COMMUNITY): Payer: Self-pay | Admitting: Orthopedic Surgery

## 2018-04-24 LAB — GLUCOSE, CAPILLARY
GLUCOSE-CAPILLARY: 210 mg/dL — AB (ref 70–99)
GLUCOSE-CAPILLARY: 263 mg/dL — AB (ref 70–99)
GLUCOSE-CAPILLARY: 317 mg/dL — AB (ref 70–99)
Glucose-Capillary: 207 mg/dL — ABNORMAL HIGH (ref 70–99)
Glucose-Capillary: 282 mg/dL — ABNORMAL HIGH (ref 70–99)

## 2018-04-24 LAB — CBC
HCT: 29.9 % — ABNORMAL LOW (ref 36.0–46.0)
Hemoglobin: 9 g/dL — ABNORMAL LOW (ref 12.0–15.0)
MCH: 24.6 pg — AB (ref 26.0–34.0)
MCHC: 30.1 g/dL (ref 30.0–36.0)
MCV: 81.7 fL (ref 78.0–100.0)
PLATELETS: 345 10*3/uL (ref 150–400)
RBC: 3.66 MIL/uL — ABNORMAL LOW (ref 3.87–5.11)
RDW: 16.8 % — ABNORMAL HIGH (ref 11.5–15.5)
WBC: 14.8 10*3/uL — ABNORMAL HIGH (ref 4.0–10.5)

## 2018-04-24 LAB — BASIC METABOLIC PANEL
Anion gap: 9 (ref 5–15)
BUN: 17 mg/dL (ref 6–20)
CALCIUM: 9.2 mg/dL (ref 8.9–10.3)
CO2: 28 mmol/L (ref 22–32)
Chloride: 102 mmol/L (ref 98–111)
Creatinine, Ser: 1 mg/dL (ref 0.44–1.00)
GFR calc Af Amer: >60 mL/min (ref >60)
Glucose, Bld: 203 mg/dL — ABNORMAL HIGH (ref 70–99)
Potassium: 4 mmol/L (ref 3.5–5.1)
SODIUM: 139 mmol/L (ref 135–145)

## 2018-04-24 MED ORDER — VANCOMYCIN HCL IN DEXTROSE 1-5 GM/200ML-% IV SOLN
1000.0000 mg | Freq: Two times a day (BID) | INTRAVENOUS | Status: DC
  Administered 2018-04-24 – 2018-04-26 (×5): 1000 mg via INTRAVENOUS
  Filled 2018-04-24 (×5): qty 200

## 2018-04-24 MED ORDER — VANCOMYCIN HCL IN DEXTROSE 1-5 GM/200ML-% IV SOLN: 1000.0000 mg | Freq: Once | INTRAVENOUS | Status: DC

## 2018-04-24 MED ORDER — PIPERACILLIN-TAZOBACTAM 3.375 G IVPB 30 MIN
3.3750 g | Freq: Once | INTRAVENOUS | Status: AC
  Administered 2018-04-24: 3.375 g via INTRAVENOUS
  Filled 2018-04-24: qty 50

## 2018-04-24 MED ORDER — PIPERACILLIN-TAZOBACTAM 3.375 G IVPB
3.3750 g | Freq: Three times a day (TID) | INTRAVENOUS | Status: DC
  Administered 2018-04-24 – 2018-04-26 (×6): 3.375 g via INTRAVENOUS
  Filled 2018-04-24 (×7): qty 50

## 2018-04-24 NOTE — Progress Notes (Signed)
Pharmacy Antibiotic Note  [name] is a 57 y.o. female admitted on 04/21/2018 with foot wound. She was started on oral doxy a few days ago and was referred to ED from wound care clinic.  Day #4 of abx for DFI with cellulitis. Was de-escalated to cefazolin but recommended to broaden by Ortho. Afebrile, WBC 14.8.  Plan: Restart vancomycin 1,250mg  IV Q12h Restart Zosyn 3.375 gm IV q8h (4 hour infusion) Monitor clinical picture, renal function, VT prn F/U C&S, abx deescalation / LOT   Temp (24hrs), Avg:98.4 F (36.9 C), Min:98.3 F (36.8 C), Max:98.5 F (36.9 C)  Recent Labs  Lab 04/21/18 1350 04/21/18 1706 04/22/18 0331 04/23/18 0625 04/24/18 0355  WBC  --  14.2* 12.9* 14.0* 14.8*  CREATININE  --  1.07* 1.23* 1.01* 1.00  LATICACIDVEN 1.83  --   --   --   Armandina Stammer--      Ryen Rhames J 04/24/2018 12:20 PM

## 2018-04-24 NOTE — Consult Note (Addendum)
[facility]thopaedic Trauma Service (OTS) Consult   Patient ID: [name] MRN: [id number] DOB/AGE: 1961-03-25 57 y.o.   Reason for Consult: R diabetic foot ulcer, cellulitis  Referring Physician: Sander Radon, MD    HPI: [name] is an 57 y.o. white female with longstanding history of type 2 DM on insulin and chronic nicotine use, depression, anxiety, hypertension, chronic left great toe ulcer who presented to the [facility] at the request of her wound care doctor on 04/21/2018. Pt went to [facility] ED on 04/20/2018 where she was given PO abx and discharged from ED to home, she followed up with her wound care physician on 04/21/2018 who noted significant erythema as well as ulcer to the plantar aspect of her right foot, her wound care MD did outline the area of erythema prior to sending her to the ED.  She was instructed to go to [facility] hospital for further evaluation.  Patient was seen and evaluated in the emergency department and admitted to the medical service.  She was started on empiric antibiotics including vancomycin, Flagyl and Rocephin. Abx deescalated to ancef alone yesterday. ABIs were performed as well which shows readings in acceptable ranges.  Sed rate and CRP are elevated.  MRI was performed which did not show obvious osteomyelitis, septic arthritis or abscess.  Wound care consult was obtained to assist with management recommended orthopedic evaluation for possible surgical debridement.  Patient has had breakfast this morning   Mild elevated temp x 2 in ED on 04/21/2018 but since then has been afebrile     Patient is unsure as to how long her foot has been in this condition other than she noticed it when looking at her feet on 04/20/2018 which is what prompted her visit to the emergency department first time.  Chronic bilateral lower extremity peripheral neuropathy for many years   Patient smokes about a pack a day   Has not had any nicotine products since hospital admission    She is  been a diabetic for 14+ years.  States that her medications have not been changed in several years.  States that her sugars normally run in the 220s at home.  Patient sees wound care physician in [place] for a chronic big toe ulcer that is currently stable   Patient lives in a ground-floor apartment   She is on disability due to her depression and anxiety.  She previously worked for [employer] here in [place]   She does not use any assistive devices at baseline however she does state she does not leave her house that often  Past Medical History:  Diagnosis Date  . Anemia    iron deficiency  . Anxiety   . COPD (chronic obstructive pulmonary disease) ([place])   . Depression   . Diabetes mellitus    type 2  . Diabetic foot ulcer ([place]) 04/2018   right ankle  . GERD (gastroesophageal reflux disease)   . Headache(784.0)   . History of colonic polyps   . Hyperlipidemia   . Hypertension   . Neuropathy   . Peptic ulcer disease     Past Surgical History:  Procedure Laterality Date  . ESOPHAGOGASTRODUODENOSCOPY  10/11/2011   Procedure: ESOPHAGOGASTRODUODENOSCOPY (EGD);  Surgeon: Zenovia Jarred, MD;  Location: [facility] ENDOSCOPY;  Service: Gastroenterology;  Laterality: N/A;  . TUBAL LIGATION      Family History  Problem Relation Age of Onset  . Alcohol abuse Other   . Drug abuse Other   . Arthritis Other   .  Diabetes Other   . Depression Other   . Hypertension Other   . Kidney disease Other   . Cancer Other        Breast,Colon,Lung, and Prostate Cancer  . Stomach cancer Father 73  . Heart attack Brother 58  . Diabetes type II Unknown        brother, both parents    Social History:  reports that she has been smoking cigarettes.  She has a 12.50 pack-year smoking history. She has never used smokeless tobacco. She reports that she does not drink alcohol or use drugs.  Allergies: No Known Allergies  Medications: I have reviewed the patient's current medications.  Current Meds    Medication Sig  . acetaminophen (TYLENOL) 500 MG tablet Take 1,000-1,050 mg by mouth daily as needed for moderate pain.  Marland Kitchen amLODipine (NORVASC) 5 MG tablet Take 5 mg by mouth daily.  Marland Kitchen buPROPion (WELLBUTRIN SR) 200 MG 12 hr tablet Take 200 mg by mouth 2 (two) times daily.  Marland Kitchen doxycycline (VIBRA-TABS) 100 MG tablet Take 100 mg by mouth 2 (two) times daily.  . DULoxetine (CYMBALTA) 30 MG capsule Take 30 mg by mouth daily.  . DULoxetine (CYMBALTA) 60 MG capsule Take 60 mg by mouth daily.   . fluticasone (FLONASE) 50 MCG/ACT nasal spray Place 2 sprays into both nostrils daily.  Marland Kitchen HUMALOG KWIKPEN 100 UNIT/ML KiwkPen Inject 0-25 Units as directed 3 (three) times daily as needed. Per sliding scale  . ibuprofen (ADVIL,MOTRIN) 200 MG tablet Take 600 mg by mouth 3 (three) times daily as needed for moderate pain.  Marland Kitchen insulin glargine (LANTUS) 100 UNIT/ML injection Inject 80 Units into the skin 2 (two) times daily.   . INVOKANA 300 MG TABS tablet Take 300 mg by mouth daily.  Marland Kitchen LATUDA 20 MG TABS tablet Take 20 mg by mouth at bedtime.  Marland Kitchen LYRICA 225 MG capsule Take 675 mg by mouth at bedtime.  . meloxicam (MOBIC) 7.5 MG tablet Take 7.5 mg by mouth at bedtime.  . metFORMIN (GLUCOPHAGE) 1000 MG tablet Take 1,000 mg by mouth 2 (two) times daily with a meal.  . metoprolol tartrate (LOPRESSOR) 50 MG tablet Take 50 mg by mouth 2 (two) times daily.  Marland Kitchen nystatin (MYCOSTATIN) 100000 UNIT/ML suspension Take 10 mLs by mouth 4 (four) times daily. Pt. States they do not measure their dose and hat they "just takes a swig"  . omeprazole (PRILOSEC) 40 MG capsule Take 40 mg by mouth daily.  . QUEtiapine (SEROQUEL) 100 MG tablet Take 100 mg by mouth at bedtime.  . SYMBICORT 80-4.5 MCG/ACT inhaler Inhale 2 puffs into the lungs 2 (two) times daily.  Marland Kitchen VICTOZA 18 MG/3ML SOPN Inject into the skin daily. 1.8 ml     Results for orders placed or performed during the hospital encounter of 04/21/18 (from the past 48 hour(s))   Glucose, capillary     Status: Abnormal   Collection Time: 04/22/18 12:41 PM  Result Value Ref Range   Glucose-Capillary 165 (H) 70 - 99 mg/dL  Glucose, capillary     Status: Abnormal   Collection Time: 04/22/18  4:15 PM  Result Value Ref Range   Glucose-Capillary 218 (H) 70 - 99 mg/dL  Glucose, capillary     Status: Abnormal   Collection Time: 04/22/18  7:56 PM  Result Value Ref Range   Glucose-Capillary 271 (H) 70 - 99 mg/dL  Glucose, capillary     Status: Abnormal   Collection Time: 04/23/18 12:39 AM  Result  Value Ref Range   Glucose-Capillary 301 (H) 70 - 99 mg/dL  Glucose, capillary     Status: Abnormal   Collection Time: 04/23/18  5:02 AM  Result Value Ref Range   Glucose-Capillary 176 (H) 70 - 99 mg/dL  C-reactive protein     Status: Abnormal   Collection Time: 04/23/18  6:25 AM  Result Value Ref Range   CRP 18.3 (H) <1.0 mg/dL    Comment: Performed at [facility] [address] [place], [place] [address]  Basic metabolic panel     Status: Abnormal   Collection Time: 04/23/18  6:25 AM  Result Value Ref Range   Sodium 139 135 - 145 mmol/L   Potassium 3.9 3.5 - 5.1 mmol/L   Chloride 106 98 - 111 mmol/L    Comment: Please note change in reference range.   CO2 26 22 - 32 mmol/L   Glucose, Bld 181 (H) 70 - 99 mg/dL    Comment: Please note change in reference range.   BUN 15 6 - 20 mg/dL    Comment: Please note change in reference range.   Creatinine, Ser 1.01 (H) 0.44 - 1.00 mg/dL   Calcium 8.8 (L) 8.9 - 10.3 mg/dL   GFR calc non Af Amer >60 >60 mL/min   GFR calc Af Amer >60 >60 mL/min    Comment: (NOTE) The eGFR has been calculated using the CKD EPI equation. This calculation has not been validated in all clinical situations. eGFR's persistently <60 mL/min signify possible Chronic Kidney Disease.    Anion gap 7 5 - 15    Comment: Performed at [facility] [address], [place], [place] [address]  CBC     Status: Abnormal   Collection Time:  04/23/18  6:25 AM  Result Value Ref Range   WBC 14.0 (H) 4.0 - 10.5 K/uL   RBC 3.51 (L) 3.87 - 5.11 MIL/uL   Hemoglobin 8.6 (L) 12.0 - 15.0 g/dL   HCT 28.6 (L) 36.0 - 46.0 %   MCV 81.5 78.0 - 100.0 fL   MCH 24.5 (L) 26.0 - 34.0 pg   MCHC 30.1 30.0 - 36.0 g/dL   RDW 16.9 (H) 11.5 - 15.5 %   Platelets 304 150 - 400 K/uL    Comment: Performed at [facility], [place] [address], [place], [place] [address]  Glucose, capillary     Status: Abnormal   Collection Time: 04/23/18  7:40 AM  Result Value Ref Range   Glucose-Capillary 170 (H) 70 - 99 mg/dL  Glucose, capillary     Status: Abnormal   Collection Time: 04/23/18 12:51 PM  Result Value Ref Range   Glucose-Capillary 261 (H) 70 - 99 mg/dL  Glucose, capillary     Status: Abnormal   Collection Time: 04/23/18  3:55 PM  Result Value Ref Range   Glucose-Capillary 263 (H) 70 - 99 mg/dL  Glucose, capillary     Status: Abnormal   Collection Time: 04/23/18  8:05 PM  Result Value Ref Range   Glucose-Capillary 230 (H) 70 - 99 mg/dL  Glucose, capillary     Status: Abnormal   Collection Time: 04/23/18 11:56 PM  Result Value Ref Range   Glucose-Capillary 263 (H) 70 - 99 mg/dL  CBC     Status: Abnormal   Collection Time: 04/24/18  3:55 AM  Result Value Ref Range   WBC 14.8 (H) 4.0 - 10.5 K/uL   RBC 3.66 (L) 3.87 - 5.11 MIL/uL  Hemoglobin 9.0 (L) 12.0 - 15.0 g/dL   HCT 29.9 (L) 36.0 - 46.0 %   MCV 81.7 78.0 - 100.0 fL   MCH 24.6 (L) 26.0 - 34.0 pg   MCHC 30.1 30.0 - 36.0 g/dL   RDW 16.8 (H) 11.5 - 15.5 %   Platelets 345 150 - 400 K/uL    Comment: Performed at [facility] [address], [place], [place] [address]  Basic metabolic panel     Status: Abnormal   Collection Time: 04/24/18  3:55 AM  Result Value Ref Range   Sodium 139 135 - 145 mmol/L   Potassium 4.0 3.5 - 5.1 mmol/L   Chloride 102 98 - 111 mmol/L    Comment: Please note change in reference range.   CO2 28 22 - 32 mmol/L   Glucose, Bld 203 (H) 70 - 99 mg/dL     Comment: Please note change in reference range.   BUN 17 6 - 20 mg/dL    Comment: Please note change in reference range.   Creatinine, Ser 1.00 0.44 - 1.00 mg/dL   Calcium 9.2 8.9 - 10.3 mg/dL   GFR calc non Af Amer >60 >60 mL/min   GFR calc Af Amer >60 >60 mL/min    Comment: (NOTE) The eGFR has been calculated using the CKD EPI equation. This calculation has not been validated in all clinical situations. eGFR's persistently <60 mL/min signify possible Chronic Kidney Disease.    Anion gap 9 5 - 15    Comment: Performed at [facility] [address], [place],  [address]  Glucose, capillary     Status: Abnormal   Collection Time: 04/24/18  5:27 AM  Result Value Ref Range   Glucose-Capillary 210 (H) 70 - 99 mg/dL  Glucose, capillary     Status: Abnormal   Collection Time: 04/24/18  7:50 AM  Result Value Ref Range   Glucose-Capillary 207 (H) 70 - 99 mg/dL    Mr Foot Right W Wo Contrast  Result Date: 04/22/2018 CLINICAL DATA:  Chronic heel ulcer with redness, swelling and drainage. EXAM: MRI OF THE RIGHT FOREFOOT WITHOUT AND WITH CONTRAST TECHNIQUE: Multiplanar, multisequence MR imaging of the right hindfoot was performed before and after the administration of intravenous contrast. CONTRAST:  71m MULTIHANCE GADOBENATE DIMEGLUMINE 529 MG/ML IV SOLN COMPARISON:  Radiographs 04/19/2018 and 04/21/2018 FINDINGS: Examination is severely limited by patient motion. There is a large ulcer on the plantar aspect of the heel with underlying inflammation/edema and enhancement suggesting cellulitis. No discrete drainable soft tissue abscess is identified. There is diffuse myofasciitis but no definite findings for pyomyositis. No obvious findings for septic arthritis or osteomyelitis. On the comparison radiographs from 07/16 and 04/21/2018 there is a fracture involving the middle phalanx of the second toe with displacement and collapse. This was not mentioned on the prior reports. IMPRESSION:  1. Very limited examination. 2. Large heel ulcer with cellulitis and myofasciitis but no discrete drainable soft tissue abscess, pyomyositis, septic arthritis or osteomyelitis. 3. Although not covered on this examination note is made of a fracture involving the middle phalanx of the second toe on recent x-rays. Electronically Signed   By: PMarijo SanesM.D.   On: 04/22/2018 12:36    Review of Systems  Constitutional: Negative for chills and fever.  Respiratory: Negative for shortness of breath and wheezing.   Cardiovascular: Negative for chest pain and palpitations.  Gastrointestinal: Negative for diarrhea, nausea and vomiting.  Neurological: Positive for sensory change.  Blood pressure (!) 160/71, pulse 93, temperature 98.5 F (36.9 C), temperature source Oral, resp. rate 17, height '5\' 7"'$  (1.702 m), weight (!) 162.8 kg (359 lb), SpO2 92 %. Physical Exam  Constitutional: She is cooperative.  Non-toxic appearance. She does not have a sickly appearance. No distress.  Obese white female, pleasant, no acute distress  Cardiovascular: Regular rhythm.  Pulmonary/Chest: Effort normal. No respiratory distress.  Musculoskeletal:  Right foot and ankle  Moderate erythema medial and lateral ankle as well as plantar  aspect of the hindfoot.  Outline  still present.  Erythema is largely confined to the area of outline.  Appears to be globally unchanged No active drainage Eschar noted ulcer to the plantar aspect of the hindfoot Eschar is well fixed, no drainage expressed when firm pressure applied Blanched tissues extends about 1 cm distally from eschar on plantar aspect of foot as well  Desquamation and maceration of soft tissue noted primarily on the lateral aspect of her hindfoot.  No active  drainage noted Necrotic tissue noted laterally  No ulcers noted over the posterior heel or Achilles insertion. Almost nonexistent sensation over the plantar or dorsal aspect of her foot.  States that first  normal sensation is noted approximately midway up her lower leg. Deformity of 2nd toe noted  Toe flexion extension is intact ankle flexion extension inversion eversion are intact and are without pain.  Patient can perform straight leg raise, quad set and active knee flexion Mild skin changes noted distally consistent with peripheral vascular disease.  Palpable DP pulse.  See pictures below   Neurological: She is alert.  Psychiatric: She has a normal mood and affect. Her speech is normal. Cognition and memory are normal.                  Assessment/Plan:  57 y/o white female with chronic DM on insulin, chronic nicotine use with complex R diabetic foot ulcer and cellulitis of unknown duration. Chronic L big toe ulcer which is stable   -complex R foot diabetic ulcer with cellulitis   Clinically pt is stable. No urgent intervention needed today   Would like to discuss with Dr. Sharol Given. Will contact him tomorrow for additional input- bedside debridement vs OR debridement   We are concerned with the ability to perform limb salvage given the location of the ulcer as well as active concomitant medical issues. I am also concerned that the zone of injury is more extensive than what is readily apparent    Upon comparing ED pictures to pictures taken today, the erythema is less intense so there is some clinical improvement    Did discuss with the patient that is imperative for her to stop smoking and to get tighter control of her sugars if she is to have any chance at saving her leg. However this may not be viable  (salvage) regardless depending on amount of injury present    We did also discuss that amputation may be necessary but pt states she will do whatever to    Does not appear pt needs further vascular eval at this time    Dietician consult   - Pain management:  Per primary    - Medical issues   Per primary   - DVT/PE prophylaxis:  Per primary  - ID:   Ancef currently     Erythema still present although less intense but zone of injury is quite large and likely extends quite deep, mri does show some myofascitis.  Strongly consider restarting vancomycin and adding zosyn and dc ancef to see if this helps clear her erythema. Could even trial zosyn alone to see if this leads to improvement       - Activity:  Up as tolerated   Can WB through toes/forefoot on R side  WBAT on left   - FEN/GI prophylaxis/Foley/Lines:  NPO after MN  Carb mod diet    - Impediments to fracture healing:  Uncontrolled DM  Nicotine use   - Dispo:  No surgery today  Confer with Dr. Sharol Given regarding further management options     Jari Pigg, PA-C [facility] [phone] (C) [phone] (O) 04/24/2018, 10:29 AM

## 2018-04-24 NOTE — Progress Notes (Signed)
PROGRESS NOTE    [name]  ZOX:[id number] DOB: July 08, 1961 DOA: 04/21/2018 PCP: Wilmer Floor., MD    Brief Narrative:  57 year old female who presented with right foot painfulwound, associated with fever and malaise. She does have significant past medical history for obesity, type 2 diabetes mellitus, hypertension, depression, and anxiety. Reported 7 days of malaise andsubjective fevers, 3 days agoshe realized had a large ulcer at the right heel, she was seen in the outpatient setting, had doxycycline prescribed, with no improvement of her symptoms. The initial physical examination blood pressure 145/58, heart rate 106, respiratory rate 15,oxygen saturation 98%. Moist mucous membranes, lungs clear to auscultation, heart S1-S2 present and rhythmic, abdomen protuberant nontender, right foot with edema, erythema, large ulcerated lesion at the heel with necrotic base, erythema in the inner ankle.Sodium 136, potassium 4.5, chloride 101, bicarb 21, glucose 128, BUN 10, creatinine 1.0, CRP 21.2, white count 14.2, hemoglobin 10.0, hematocrit 34.4, platelets 291.Right foot radiograph large plantar calcaneal ulcer without acute bony abnormalities. Displaced intra-articular fracture at base of middle phalanx right second toe.Chest x-ray negative for infiltrates. EKG normal sinus rhythm, normal axis.  Patient was admitted to the hospital with working diagnosis of diabetic right foot ulcer,failed outpatient therapy.   Assessment & Plan:   Principal Problem:   Diabetic foot infection (HCC) Active Problems:   Insulin-requiring or dependent type II diabetes mellitus (HCC)   Depression with anxiety   Essential hypertension   Normocytic anemia   Mild renal insufficiency   Sepsis due to cellulitis (HCC)   1. Right foot ulcer, diabetic foot complicated with sepsis. Rash persistent and more intense compared to last 24 hours, will change back antibiotic therapy with IV vancomycin  and IV zosyn, wbc worsening at 14,8, no fevers. Will follow on surgical consultation, possible debridement.   2. HTN. On amlodipine and metoprolol for blood pressure control.   3. T2DM. Uncontrolled with capillary glucose 230, 263, 207, 263,. Patient will be NPO past midnight, will continue current regimen of 40 units of lantus bid and insulin sliding scale, will need tighter glucose control when po intake not interrupted.    4. Morbid obesity. Will nee close follow up as outpatient. Out of bed as tolerated. Continue glucerna, and juven.    5. Depression. Continue with bupropion, duloxetine, seroquel and latuda.  DVT prophylaxis:enoxaparin Code Status:full Family Communication:no family at the beside Disposition Plan/ discharge barriers:pending clinical improvement.   Consultants:    Procedures:    Antimicrobials:  cefalexin    Subjective: Patient with persistent pain on her right foot, with erythema, decreased edema, no nausea or vomiting, no chest pain or dyspnea.   Objective: Vitals:   04/23/18 1900 04/23/18 2218 04/24/18 0534 04/24/18 0750  BP:  (!) 150/82 (!) 160/71   Pulse:  87 93   Resp:   17   Temp:   98.5 F (36.9 C)   TempSrc:   Oral   SpO2: 96%  91% 92%  Weight:      Height:        Intake/Output Summary (Last 24 hours) at 04/24/2018 0919 Last data filed at 04/24/2018 0535 Gross per 24 hour  Intake 340 ml  Output 450 ml  Net -110 ml   Filed Weights   04/21/18 1715  Weight: (!) 162.8 kg (359 lb)    Examination:   General: Not in pain or dyspnea, deconditioned  Neurology: Awake and alert, non focal  E ENT: no pallor, no icterus, oral mucosa moist Cardiovascular: No JVD.  S1-S2 present, rhythmic, no gallops, rubs, or murmurs. No lower extremity edema. Pulmonary: vesicular breath sounds bilaterally, adequate air movement, no wheezing, rhonchi or rales. Gastrointestinal. Abdomen protuberant with no organomegaly, non tender, no  rebound or guarding Skin. Right ankle edema and erythema, tender to palpation and increased local temperature. Large stage 3 ulcer at the heal. Purulence at the medial aspect.  Musculoskeletal: no joint deformities           Data Reviewed: I have personally reviewed following labs and imaging studies  CBC: Recent Labs  Lab 04/21/18 1706 04/22/18 0331 04/23/18 0625 04/24/18 0355  WBC 14.2* 12.9* 14.0* 14.8*  NEUTROABS 11.3* 8.4*  --   --   HGB 10.0* 9.0* 8.6* 9.0*  HCT 34.4* 30.2* 28.6* 29.9*  MCV 83.3 81.8 81.5 81.7  PLT 291 280 304 345   Basic Metabolic Panel: Recent Labs  Lab 04/21/18 1706 04/22/18 0331 04/23/18 0625 04/24/18 0355  NA 136 138 139 139  K 4.5 3.5 3.9 4.0  CL 101 103 106 102  CO2 21* 25 26 28   GLUCOSE 128* 173* 181* 203*  BUN 10 13 15 17   CREATININE 1.07* 1.23* 1.01* 1.00  CALCIUM 8.7* 8.3* 8.8* 9.2   GFR: Estimated Creatinine Clearance: 102.5 mL/min (by C-G formula based on SCr of 1 mg/dL). Liver Function Tests: No results for input(s): AST, ALT, ALKPHOS, BILITOT, PROT, ALBUMIN in the last 168 hours. No results for input(s): LIPASE, AMYLASE in the last 168 hours. No results for input(s): AMMONIA in the last 168 hours. Coagulation Profile: No results for input(s): INR, PROTIME in the last 168 hours. Cardiac Enzymes: No results for input(s): CKTOTAL, CKMB, CKMBINDEX, TROPONINI in the last 168 hours. BNP (last 3 results) No results for input(s): PROBNP in the last 8760 hours. HbA1C: Recent Labs    04/21/18 2146  HGBA1C 8.3*   CBG: Recent Labs  Lab 04/23/18 1555 04/23/18 2005 04/23/18 2356 04/24/18 0527 04/24/18 0750  GLUCAP 263* 230* 263* 210* 207*   Lipid Profile: No results for input(s): CHOL, HDL, LDLCALC, TRIG, CHOLHDL, LDLDIRECT in the last 72 hours. Thyroid Function Tests: No results for input(s): TSH, T4TOTAL, FREET4, T3FREE, THYROIDAB in the last 72 hours. Anemia Panel: Recent Labs    04/22/18 0331  VITAMINB12 287    FOLATE 9.2  FERRITIN 88  TIBC 234*  IRON 33  RETICCTPCT 0.7      Radiology Studies: I have reviewed all of the imaging during this hospital visit personally     Scheduled Meds: . amLODipine  5 mg Oral Daily  . buPROPion  200 mg Oral BID  . collagenase   Topical Daily  . DULoxetine  60 mg Oral Daily  . feeding supplement (GLUCERNA SHAKE)  237 mL Oral TID BM  . insulin aspart  0-9 Units Subcutaneous Q4H  . insulin glargine  40 Units Subcutaneous BID  . lurasidone  20 mg Oral QHS  . metoprolol tartrate  50 mg Oral BID  . mometasone-formoterol  2 puff Inhalation BID  . nutrition supplement (JUVEN)  1 packet Oral BID BM  . pantoprazole  40 mg Oral Daily  . pregabalin  675 mg Oral QHS  . QUEtiapine  100 mg Oral QHS  . sodium chloride flush  3 mL Intravenous Q12H   Continuous Infusions: . sodium chloride    .  ceFAZolin (ANCEF) IV 2 g (04/24/18 0527)     LOS: 3 days        Rahm Minix Annett Gulaaniel Gladyce Mcray, MD [facility]  Pager [phone]

## 2018-04-25 LAB — CBC
HCT: 28 % — ABNORMAL LOW (ref 36.0–46.0)
Hemoglobin: 8.5 g/dL — ABNORMAL LOW (ref 12.0–15.0)
MCH: 24.8 pg — AB (ref 26.0–34.0)
MCHC: 30.4 g/dL (ref 30.0–36.0)
MCV: 81.6 fL (ref 78.0–100.0)
Platelets: 370 10*3/uL (ref 150–400)
RBC: 3.43 MIL/uL — ABNORMAL LOW (ref 3.87–5.11)
RDW: 16.9 % — AB (ref 11.5–15.5)
WBC: 13.2 10*3/uL — ABNORMAL HIGH (ref 4.0–10.5)

## 2018-04-25 LAB — GLUCOSE, CAPILLARY
GLUCOSE-CAPILLARY: 185 mg/dL — AB (ref 70–99)
GLUCOSE-CAPILLARY: 227 mg/dL — AB (ref 70–99)
Glucose-Capillary: 177 mg/dL — ABNORMAL HIGH (ref 70–99)
Glucose-Capillary: 150 mg/dL — ABNORMAL HIGH (ref 70–99)
Glucose-Capillary: 288 mg/dL — ABNORMAL HIGH (ref 70–99)
Glucose-Capillary: 305 mg/dL — ABNORMAL HIGH (ref 70–99)

## 2018-04-25 MED ORDER — PRO-STAT SUGAR FREE PO LIQD
30.0000 mL | Freq: Two times a day (BID) | ORAL | Status: DC
  Administered 2018-04-26 (×2): 30 mL via ORAL
  Filled 2018-04-25 (×2): qty 30

## 2018-04-25 MED ORDER — LORATADINE 10 MG PO TABS: 10.0000 mg | ORAL_TABLET | Freq: Every day | ORAL | Status: DC

## 2018-04-25 MED ORDER — LORATADINE 10 MG PO TABS
10.0000 mg | ORAL_TABLET | Freq: Every day | ORAL | Status: DC
  Administered 2018-04-25 – 2018-04-27 (×3): 10 mg via ORAL
  Filled 2018-04-25 (×3): qty 1

## 2018-04-25 NOTE — Plan of Care (Signed)
  Problem: Skin Integrity: Goal: Risk for impaired skin integrity will decrease Outcome: Progressing   

## 2018-04-25 NOTE — Progress Notes (Signed)
PROGRESS NOTE    [name]  ZOX:[id number] DOB: 10-Mar-1961 DOA: 04/21/2018 PCP: Wilmer Floor., MD    Brief Narrative:  57 year old female who presented with right foot painfulwound, associated with fever and malaise. She does have significant past medical history for obesity, type 2 diabetes mellitus, hypertension, depression, and anxiety. Reported 7 days of malaise andsubjective fevers, 3 days agoshe realized had a large ulcer at the right heel, she was seen in the outpatient setting, had doxycycline prescribed, with no improvement of her symptoms. The initial physical examination blood pressure 145/58, heart rate 106, respiratory rate 15,oxygen saturation 98%. Moist mucous membranes, lungs clear to auscultation, heart S1-S2 present and rhythmic, abdomen protuberant nontender, right foot with edema, erythema, large ulcerated lesion at the heel with necrotic base, erythema in the inner ankle.Sodium 136, potassium 4.5, chloride 101, bicarb 21, glucose 128, BUN 10, creatinine 1.0, CRP 21.2, white count 14.2, hemoglobin 10.0, hematocrit 34.4, platelets 291.Right foot radiograph large plantar calcaneal ulcer without acute bony abnormalities. Displaced intra-articular fracture at base of middle phalanx right second toe.Chest x-ray negative for infiltrates. EKG normal sinus rhythm, normal axis.  Patient was admitted to the hospital with working diagnosis of diabetic right foot ulcer,failed outpatient therapy.   Assessment & Plan:   Principal Problem:   Diabetic foot infection (HCC) Active Problems:   Insulin-requiring or dependent type II diabetes mellitus (HCC)   Depression with anxiety   Essential hypertension   Normocytic anemia   Mild renal insufficiency   Sepsis due to cellulitis (HCC)   1. Right foot ulcer, diabetic foot complicated with sepsis.Continue antibiotic therapy with IV vancomycin and IV zosyn, wbc down to 13,2. Has remained afebrile, pending final  orthopedic recommendations, regards surgery.   2. HTN.Continue amlodipine and metoprolol.   3. T2DM.Capillary glucose 185, 227, 177, 150, 288. Patient has been npo during the am, will continue monitoring and further adjustment, for now continue insulin sliding scale and 40 units of lantus bid.     4. Morbid obesity.Continue out of bed as tolerated. Nutritional suppelements with glucerna, and juven.  5. Depression. Toleration wellbupropion, duloxetine, seroquel and latuda.  DVT prophylaxis:enoxaparin Code Status:full Family Communication:no family at the beside Disposition Plan/ discharge barriers:pending clinical improvement.   Consultants:    Procedures:    Antimicrobials:  cefalexin     Subjective: Patient continue to have pain at her right foot, persistent erythema, worse with movement, improved with analgesics.   Objective: Vitals:   04/24/18 2123 04/25/18 0530 04/25/18 0900 04/25/18 1355  BP: (!) 150/60 (!) 154/55  121/62  Pulse: 79 80  69  Resp: 17 18  16   Temp: 98.8 F (37.1 C) 98.8 F (37.1 C)  98.6 F (37 C)  TempSrc: Oral Oral  Oral  SpO2: 95% (!) 89% 92% 96%  Weight:      Height:        Intake/Output Summary (Last 24 hours) at 04/25/2018 1632 Last data filed at 04/25/2018 1300 Gross per 24 hour  Intake 360 ml  Output -  Net 360 ml   Filed Weights   04/21/18 1715  Weight: (!) 162.8 kg (359 lb)    Examination:   General: Not in pain or dyspnea, deconditioned  Neurology: Awake and alert, non focal  E ENT: no pallor, no icterus, oral mucosa moist Cardiovascular: No JVD. S1-S2 present, rhythmic, no gallops, rubs, or murmurs. No lower extremity edema. Pulmonary: vesicular breath sounds bilaterally, adequate air movement, no wheezing, rhonchi or rales. Gastrointestinal. Abdomen with no  organomegaly, non tender, no rebound or guarding Skin. Positive erythema at the ankle, ulcerated lesion at the heal, positive  purulence at the medial aspect.  Musculoskeletal: no joint deformities     Data Reviewed: I have personally reviewed following labs and imaging studies  CBC: Recent Labs  Lab 04/21/18 1706 04/22/18 0331 04/23/18 0625 04/24/18 0355 04/25/18 0555  WBC 14.2* 12.9* 14.0* 14.8* 13.2*  NEUTROABS 11.3* 8.4*  --   --   --   HGB 10.0* 9.0* 8.6* 9.0* 8.5*  HCT 34.4* 30.2* 28.6* 29.9* 28.0*  MCV 83.3 81.8 81.5 81.7 81.6  PLT 291 280 304 345 370   Basic Metabolic Panel: Recent Labs  Lab 04/21/18 1706 04/22/18 0331 04/23/18 0625 04/24/18 0355  NA 136 138 139 139  K 4.5 3.5 3.9 4.0  CL 101 103 106 102  CO2 21* 25 26 28   GLUCOSE 128* 173* 181* 203*  BUN 10 13 15 17   CREATININE 1.07* 1.23* 1.01* 1.00  CALCIUM 8.7* 8.3* 8.8* 9.2   GFR: Estimated Creatinine Clearance: 102.5 mL/min (by C-G formula based on SCr of 1 mg/dL). Liver Function Tests: No results for input(s): AST, ALT, ALKPHOS, BILITOT, PROT, ALBUMIN in the last 168 hours. No results for input(s): LIPASE, AMYLASE in the last 168 hours. No results for input(s): AMMONIA in the last 168 hours. Coagulation Profile: No results for input(s): INR, PROTIME in the last 168 hours. Cardiac Enzymes: No results for input(s): CKTOTAL, CKMB, CKMBINDEX, TROPONINI in the last 168 hours. BNP (last 3 results) No results for input(s): PROBNP in the last 8760 hours. HbA1C: No results for input(s): HGBA1C in the last 72 hours. CBG: Recent Labs  Lab 04/25/18 0020 04/25/18 0337 04/25/18 0737 04/25/18 1216 04/25/18 1557  GLUCAP 185* 227* 177* 150* 288*   Lipid Profile: No results for input(s): CHOL, HDL, LDLCALC, TRIG, CHOLHDL, LDLDIRECT in the last 72 hours. Thyroid Function Tests: No results for input(s): TSH, T4TOTAL, FREET4, T3FREE, THYROIDAB in the last 72 hours. Anemia Panel: No results for input(s): VITAMINB12, FOLATE, FERRITIN, TIBC, IRON, RETICCTPCT in the last 72 hours.    Radiology Studies: I have reviewed all of the  imaging during this hospital visit personally     Scheduled Meds: . amLODipine  5 mg Oral Daily  . buPROPion  200 mg Oral BID  . collagenase   Topical Daily  . DULoxetine  60 mg Oral Daily  . [START ON 04/26/2018] feeding supplement (PRO-STAT SUGAR FREE 64)  30 mL Oral BID  . insulin aspart  0-9 Units Subcutaneous Q4H  . insulin glargine  40 Units Subcutaneous BID  . lurasidone  20 mg Oral QHS  . metoprolol tartrate  50 mg Oral BID  . mometasone-formoterol  2 puff Inhalation BID  . nutrition supplement (JUVEN)  1 packet Oral BID BM  . pantoprazole  40 mg Oral Daily  . pregabalin  675 mg Oral QHS  . QUEtiapine  100 mg Oral QHS  . sodium chloride flush  3 mL Intravenous Q12H   Continuous Infusions: . sodium chloride    . piperacillin-tazobactam (ZOSYN)  IV 3.375 g (04/25/18 1243)  . vancomycin 1,000 mg (04/25/18 1359)     LOS: 4 days        Samora Jernberg Annett Gulaaniel Kedrick Mcnamee, MD [facility] Pager [phone]

## 2018-04-25 NOTE — Progress Notes (Signed)
PROGRESS NOTE  [name]  ZOX:[id number] DOB: Nov 16, 1960 DOA: 04/21/2018 PCP: Wilmer Floor., MD   Brief Narrative:  [name] is a 57 y.o. female with a past medical history significant for diabetes mellitus, hypertension, depression/anxiety, morbid obesity, COPD, Hyperlipidemia, and chronic left toe ulcer. She presented to the emergency department on 7/18 complaining of fevers, malaise and right foot ulcer with surrounding erythema, swelling and drainage. She had malaise x1 week and subjective fevers x4 days, but only noticed the ulcer about 2 days ago in the shower while washing her feet. She was seen at [facility] on 7/18 and was discharged on doxycycline. Patient followed up with wound care yesterday and was sent to the ED for concern of sepsis and osteomyelitis.   In the emergency department, her vitals were BP 152/99, HR 103, RR 15, Temp 100.7, SpO2 94%. She was found to have 8 cm x 5 cm ulceration on the plantar aspect of her right foot with cellulitis extending up the medial and lateral aspects of the foot. Pertinent lab values: WBC 14.2, Hb 10.0 sCr 1.07, A1C 8.3, Sed Rate 105, CRP 21.2, Prealbumin 8.8. Xray of right foot was negative for osteomyelitis with a intra-articular fracture of the right middle phalanx of the 2nd toe.EKG showed sinus tachycardia.  Lactic acid was not elevated. Blood cultures were collected. The patient was treated with tylenol, dilaudid, vancomycin and zosyn in the ED. She was admitted with the diagnosis of infected diabetic foot ulcer.   Subjective: Patient has persistent pain in her right foot that is 8/10 in intensity and stabbing in nature. It has not improved or worsened. She has attempted to walk, but has not done much as the pain is significant. She denies any NVD, fevers/chills, chest pain, or increased SOB.   Assessment & Plan:  Right diabetic foot ulcer, complicated with sepsis  - Continue IV vancomycin and IV Zosyn. WBC improving at 11.1. No  fevers.  - Follow on orthopedic surgical consult for possible debridement.  - Blood cultures no growth x 3 days.  Hypertension  - Stable. Continue norvasc and metoprolol.   Type 2 Diabetes Mellitus - Patient has been NPO since midnight. BG 177, 227, 185. Continue 40 units lantus BID and insulin sliding scale until PO intake stabilizes.   Morbid Obesity  - Dietician consult during stay. Close follow-up as an outpatient. Continue Glucerna and Juven.  Depression/Anxiety - Continue buproprion, duloxetine, seroquel, and latuda.   DVT prophylaxis: Lovenox Code Status: FULL Family Communication: No family at bedside Disposition Plan: Unclear, pending clinical improvement   Consultants:   Orthopedic surgery   Procedures:   None  Antimicrobials:   Vancomycin and Zosyn   Objective: Vitals:   04/24/18 1333 04/24/18 2123 04/25/18 0530 04/25/18 0900  BP: (!) 130/51 (!) 150/60 (!) 154/55   Pulse: 73 79 80   Resp: 18 17 18    Temp: 98.9 F (37.2 C) 98.8 F (37.1 C) 98.8 F (37.1 C)   TempSrc: Oral Oral Oral   SpO2: 96% 95% (!) 89% 92%  Weight:      Height:        Intake/Output Summary (Last 24 hours) at 04/25/2018 1138 Last data filed at 04/25/2018 0530 Gross per 24 hour  Intake 2660.33 ml  Output -  Net 2660.33 ml   Filed Weights   04/21/18 1715  Weight: (!) 162.8 kg (359 lb)    Examination: General appearance: obese adult female, awake and alert. NAD.   HEENT: Anicteric, conjunctiva pink,  lids and lashes normal. No nasal deformity, discharge, epistaxis. Skin: stage 3 ulcer on the plantar aspect of the right foot (calcaneal region). There is surrounding erythema, edema and warmth that extends up both the medial and lateral aspect of the foot. Cardiac: RRR, nl S1-S2, no murmurs appreciated. No JVD. Distal pulses are intact bilaterally. Respiratory: Normal respiratory rate and rhythm. Decreased breath sounds throughout. No wheezes, crackles, or rales noted.  Abdomen:  Abdomen soft and non-distended. No TTP. Positive bowel sounds throughout. Neuro: AOx4. Moves all extremities. Speech fluent.    Psych: Sensorium intact and responding to questions, attention normal. Affect normal. Judgment and insight appear normal.  Data Reviewed: I have personally reviewed following labs and imaging studies:  CBC: Recent Labs  Lab 04/21/18 1706 04/22/18 0331 04/23/18 0625 04/24/18 0355 04/25/18 0555  WBC 14.2* 12.9* 14.0* 14.8* 13.2*  NEUTROABS 11.3* 8.4*  --   --   --   HGB 10.0* 9.0* 8.6* 9.0* 8.5*  HCT 34.4* 30.2* 28.6* 29.9* 28.0*  MCV 83.3 81.8 81.5 81.7 81.6  PLT 291 280 304 345 370   Basic Metabolic Panel: Recent Labs  Lab 04/21/18 1706 04/22/18 0331 04/23/18 0625 04/24/18 0355  NA 136 138 139 139  K 4.5 3.5 3.9 4.0  CL 101 103 106 102  CO2 21* 25 26 28   GLUCOSE 128* 173* 181* 203*  BUN 10 13 15 17   CREATININE 1.07* 1.23* 1.01* 1.00  CALCIUM 8.7* 8.3* 8.8* 9.2   GFR: Estimated Creatinine Clearance: 102.5 mL/min (by C-G formula based on SCr of 1 mg/dL).  CBG: Recent Labs  Lab 04/24/18 1712 04/24/18 1948 04/25/18 0020 04/25/18 0337 04/25/18 0737  GLUCAP 282* 317* 185* 227* 177*   Urine analysis:    Component Value Date/Time   COLORURINE YELLOW 10/08/2011 0746   APPEARANCEUR CLEAR 10/08/2011 0746   LABSPEC 1.038 (H) 10/08/2011 0746   PHURINE 5.5 10/08/2011 0746   GLUCOSEU >1000 (A) 10/08/2011 0746   GLUCOSEU >=1000 03/11/2011 1504   HGBUR SMALL (A) 10/08/2011 0746   BILIRUBINUR MODERATE (A) 10/08/2011 0746   KETONESUR >80 (A) 10/08/2011 0746   PROTEINUR NEGATIVE 10/08/2011 0746   UROBILINOGEN 0.2 10/08/2011 0746   NITRITE NEGATIVE 10/08/2011 0746   LEUKOCYTESUR NEGATIVE 10/08/2011 0746   Recent Results (from the past 240 hour(s))  Culture, blood (Routine X 2) w Reflex to ID Panel     Status: None (Preliminary result)   Collection Time: 04/21/18  5:48 PM  Result Value Ref Range Status   Specimen Description BLOOD RIGHT  ANTECUBITAL  Final   Special Requests   Final    BOTTLES DRAWN AEROBIC AND ANAEROBIC Blood Culture adequate volume   Culture   Final    NO GROWTH 3 DAYS Performed at [facility], [address], [place], [place] [address]    Report Status PENDING  Incomplete  Culture, blood (Routine X 2) w Reflex to ID Panel     Status: None (Preliminary result)   Collection Time: 04/21/18  5:53 PM  Result Value Ref Range Status   Specimen Description BLOOD RIGHT ANTECUBITAL  Final   Special Requests   Final    BOTTLES DRAWN AEROBIC AND ANAEROBIC Blood Culture adequate volume   Culture   Final    NO GROWTH 3 DAYS Performed at [facility], [address] [place], [place] [address]    Report Status PENDING  Incomplete     Radiology Studies: No results found.  Scheduled Meds: . amLODipine  5 mg Oral Daily  . buPROPion  200 mg Oral BID  . collagenase   Topical Daily  . DULoxetine  60 mg Oral Daily  . feeding supplement (GLUCERNA SHAKE)  237 mL Oral TID BM  . insulin aspart  0-9 Units Subcutaneous Q4H  . insulin glargine  40 Units Subcutaneous BID  . lurasidone  20 mg Oral QHS  . metoprolol tartrate  50 mg Oral BID  . mometasone-formoterol  2 puff Inhalation BID  . nutrition supplement (JUVEN)  1 packet Oral BID BM  . pantoprazole  40 mg Oral Daily  . pregabalin  675 mg Oral QHS  . QUEtiapine  100 mg Oral QHS  . sodium chloride flush  3 mL Intravenous Q12H   Continuous Infusions: . sodium chloride    . piperacillin-tazobactam (ZOSYN)  IV 3.375 g (04/25/18 0349)  . vancomycin 1,000 mg (04/25/18 0037)    LOS: 4 days   Kevon Tench, PA-S [facility] 04/25/2018, 11:38 AM   [email or web address] Password [email or web address] If 7PM-7AM, please contact night-coverage

## 2018-04-25 NOTE — Progress Notes (Signed)
Nutrition Follow-up  DOCUMENTATION CODES:   Morbid obesity  INTERVENTION:   -D/c Glucerna Shake po TID, each supplement provides 220 kcal and 10 grams of protein, due to poor acceptance -Continue 1 packet Juven BID, each packet provides 80 calories, 8 grams of carbohydrate, and 14 grams of amino acids; supplement contains CaHMB, glutamine, and arginine, to promote wound healing -30 ml Prostat BID, each supplement provides 100 kcals and 15 grams protein   NUTRITION DIAGNOSIS:   Increased nutrient needs related to wound healing as evidenced by estimated needs.  Ongoing  GOAL:   Patient will meet greater than or equal to 90% of their needs  Progressing  MONITOR:   PO intake, Supplement acceptance, Weight trends, Labs  REASON FOR ASSESSMENT:   Consult Wound healing  ASSESSMENT:   Patient with PMH significant for DM, HTN, depression, anxiety, and chronic left great toe ulcer. Presents this admission with complaints of fevers, malaise, and right foot ulcer. Admitted for diabetic foot infection.   RD re-consulted for assessment; noted RD assessed on 04/22/18- refer to note for further details.   Chart reviewed; pt for potential surgery. She is currently NPO.   Pt previously on carbohydrate modified diet. Noted meal completion 75-100%. Pt is refusing Glucerna supplements, however, taking Juven.   Last Hgb A1c: 8.3 (04/21/18). PTA DM medications 0-25 units insulin aspart TID, 80 units insulin glargine BID, 300 mg invokana daily, 1000 mg metformin BID, and 10-25 units insulin aspart TID.   Labs reviewed: CBGS: 177-317 (inpatient orders for glycemic control are 0-9 units insulin aspart every 4 hours and 40 units insulin glargine BID).   Diet Order:   Diet Order           Diet Carb Modified Fluid consistency: Thin; Room service appropriate? Yes  Diet effective now          EDUCATION NEEDS:   Education needs have been addressed  Skin:  Skin Assessment: Skin Integrity  Issues: Skin Integrity Issues:: Diabetic Ulcer Diabetic Ulcer: right plantar full thickness  Last BM:  04/23/18  Height:   Ht Readings from Last 1 Encounters:  04/22/18 5\' 7"  (1.702 m)    Weight:   Wt Readings from Last 1 Encounters:  04/21/18 (!) 359 lb (162.8 kg)    Ideal Body Weight:  61.4 kg  BMI:  Body mass index is 56.23 kg/m.  Estimated Nutritional Needs:   Kcal:  1850-2150  Protein:  120-135 grams  Fluid:  > 1.8 L    Giordano Getman A. Mayford KnifeWilliams, RD, LDN, CDE Pager: [phone] After hours Pager: [phone]

## 2018-04-26 ENCOUNTER — Inpatient Hospital Stay: Payer: Self-pay

## 2018-04-26 DIAGNOSIS — L03115 Cellulitis of right lower limb: Secondary | ICD-10-CM

## 2018-04-26 DIAGNOSIS — L089 Local infection of the skin and subcutaneous tissue, unspecified: Secondary | ICD-10-CM

## 2018-04-26 DIAGNOSIS — E11621 Type 2 diabetes mellitus with foot ulcer: Secondary | ICD-10-CM

## 2018-04-26 DIAGNOSIS — L84 Corns and callosities: Secondary | ICD-10-CM

## 2018-04-26 DIAGNOSIS — I10 Essential (primary) hypertension: Secondary | ICD-10-CM

## 2018-04-26 DIAGNOSIS — E11628 Type 2 diabetes mellitus with other skin complications: Secondary | ICD-10-CM

## 2018-04-26 DIAGNOSIS — L97519 Non-pressure chronic ulcer of other part of right foot with unspecified severity: Secondary | ICD-10-CM

## 2018-04-26 DIAGNOSIS — F17210 Nicotine dependence, cigarettes, uncomplicated: Secondary | ICD-10-CM

## 2018-04-26 DIAGNOSIS — Z6841 Body Mass Index (BMI) 40.0 and over, adult: Secondary | ICD-10-CM

## 2018-04-26 DIAGNOSIS — E118 Type 2 diabetes mellitus with unspecified complications: Secondary | ICD-10-CM

## 2018-04-26 DIAGNOSIS — E440 Moderate protein-calorie malnutrition: Secondary | ICD-10-CM

## 2018-04-26 DIAGNOSIS — R197 Diarrhea, unspecified: Secondary | ICD-10-CM

## 2018-04-26 LAB — CULTURE, BLOOD (ROUTINE X 2)
CULTURE: NO GROWTH
Culture: NO GROWTH
Special Requests: ADEQUATE
Special Requests: ADEQUATE

## 2018-04-26 LAB — CBC WITH DIFFERENTIAL/PLATELET
Abs Immature Granulocytes: 0.3 10*3/uL — ABNORMAL HIGH (ref 0.0–0.1)
BASOS PCT: 1 %
Basophils Absolute: 0.1 10*3/uL (ref 0.0–0.1)
EOS ABS: 0.3 10*3/uL (ref 0.0–0.7)
EOS PCT: 2 %
HEMATOCRIT: 28.7 % — AB (ref 36.0–46.0)
Hemoglobin: 8.6 g/dL — ABNORMAL LOW (ref 12.0–15.0)
IMMATURE GRANULOCYTES: 3 %
LYMPHS ABS: 2.3 10*3/uL (ref 0.7–4.0)
Lymphocytes Relative: 18 %
MCH: 24.6 pg — ABNORMAL LOW (ref 26.0–34.0)
MCHC: 30 g/dL (ref 30.0–36.0)
MCV: 82.2 fL (ref 78.0–100.0)
Monocytes Absolute: 1.1 10*3/uL — ABNORMAL HIGH (ref 0.1–1.0)
Monocytes Relative: 9 %
NEUTROS PCT: 67 %
Neutro Abs: 8.3 10*3/uL — ABNORMAL HIGH (ref 1.7–7.7)
PLATELETS: 376 10*3/uL (ref 150–400)
RBC: 3.49 MIL/uL — ABNORMAL LOW (ref 3.87–5.11)
RDW: 17.3 % — AB (ref 11.5–15.5)
WBC: 12.4 10*3/uL — AB (ref 4.0–10.5)

## 2018-04-26 LAB — GLUCOSE, CAPILLARY
GLUCOSE-CAPILLARY: 196 mg/dL — AB (ref 70–99)
Glucose-Capillary: 179 mg/dL — ABNORMAL HIGH (ref 70–99)
Glucose-Capillary: 156 mg/dL — ABNORMAL HIGH (ref 70–99)
Glucose-Capillary: 230 mg/dL — ABNORMAL HIGH (ref 70–99)
Glucose-Capillary: 279 mg/dL — ABNORMAL HIGH (ref 70–99)
Glucose-Capillary: 270 mg/dL — ABNORMAL HIGH (ref 70–99)

## 2018-04-26 LAB — BASIC METABOLIC PANEL
Anion gap: 9 (ref 5–15)
BUN: 14 mg/dL (ref 6–20)
CALCIUM: 8.7 mg/dL — AB (ref 8.9–10.3)
CO2: 27 mmol/L (ref 22–32)
CREATININE: 0.95 mg/dL (ref 0.44–1.00)
Chloride: 102 mmol/L (ref 98–111)
GLUCOSE: 161 mg/dL — AB (ref 70–99)
Potassium: 3.7 mmol/L (ref 3.5–5.1)
Sodium: 138 mmol/L (ref 135–145)

## 2018-04-26 LAB — VANCOMYCIN, TROUGH: Vancomycin Tr: 14 ug/mL — ABNORMAL LOW (ref 15–20)

## 2018-04-26 MED ORDER — COLLAGENASE 250 UNIT/GM EX OINT
TOPICAL_OINTMENT | Freq: Every day | CUTANEOUS | Status: DC
  Filled 2018-04-26: qty 30

## 2018-04-26 MED ORDER — AMOXICILLIN-POT CLAVULANATE 875-125 MG PO TABS
1.0000 | ORAL_TABLET | Freq: Two times a day (BID) | ORAL | Status: DC
  Administered 2018-04-26 – 2018-04-27 (×2): 1 via ORAL
  Filled 2018-04-26 (×2): qty 1

## 2018-04-26 MED ORDER — INSULIN GLARGINE 100 UNIT/ML ~~LOC~~ SOLN
45.0000 [IU] | Freq: Two times a day (BID) | SUBCUTANEOUS | Status: DC
  Administered 2018-04-26 – 2018-04-27 (×2): 45 [IU] via SUBCUTANEOUS
  Filled 2018-04-26 (×3): qty 0.45

## 2018-04-26 MED ORDER — DOXYCYCLINE HYCLATE 100 MG PO TABS
100.0000 mg | ORAL_TABLET | Freq: Two times a day (BID) | ORAL | Status: DC
  Administered 2018-04-26 – 2018-04-27 (×2): 100 mg via ORAL
  Filled 2018-04-26 (×2): qty 1

## 2018-04-26 MED ORDER — WITCH HAZEL-GLYCERIN EX PADS
MEDICATED_PAD | CUTANEOUS | Status: DC | PRN
Start: 2018-04-26 — End: 2018-04-26
  Filled 2018-04-26: qty 100

## 2018-04-26 MED ORDER — PREPARATION H 50 % EX PADS
1.0000 "application " | MEDICATED_PAD | CUTANEOUS | Status: DC | PRN
  Filled 2018-04-26: qty 1
  Filled 2018-04-26: qty 10

## 2018-04-26 NOTE — Progress Notes (Signed)
Orthopedic Tech Progress Note Patient Details:  [name] 1961-02-01 [id number]  Ortho Devices Type of Ortho Device: Prafo boot/shoe Ortho Device/Splint Location: rle Ortho Device/Splint Interventions: Ordered, Application, Adjustment   Post Interventions Patient Tolerated: Well Instructions Provided: Care of device, Adjustment of device   Trinna PostMartinez, Elmond Poehlman J 04/26/2018, 9:12 PM

## 2018-04-26 NOTE — Care Management Note (Addendum)
Case Management Note  Patient Details  Name: [name] MRN: [id number] Date of Birth: 03/26/1961  Subjective/Objective:             Spoke w patient at the bedside. Discussed disposition. She informs me that she will DC to  Home with assistance of her daughter. She states that she wants IV Abx and a PICC line. I informed her that I spoke w Dr Ella JubileeArrien earlier today and plan was to DC her on oral abx to home after he had discussed case with ID. He also [facility] w dressing changes. Patient states she would like to speak to Dr Ella JubileeArrien and ID doctor to discuss IV Abx. She states [facility], [facility].  Per Dr Ella JubileeArrien Dr Luciana Axeomer to assess for need of IV Abx prior to DC.    Action/Plan:  [facility], awaiting decision if patient will need IV Abx.   7/24 DME [facility]ariatric RW [facility], [facility] will [facility] today w [facility]. Patient's daughter will provide transport home later this afternoon. Patient will DC on PO abx. No other CM needs.    Expected Discharge Date:                  Expected Discharge Plan:  Home w Home Health Services  In-House Referral:     Discharge planning Services  CM Consult  Post Acute Care Choice:  Home Health Choice offered to:  Patient  DME Arranged:    DME Agency:     HH Arranged:    HH Agency:     Status of Service:  Completed, signed off  If discussed at [employer] Length of Stay Meetings, dates discussed:    Additional Comments:  Lawerance SabalDebbie Odysseus Cada, RN 04/26/2018, 1:34 PM

## 2018-04-26 NOTE — Progress Notes (Signed)
Pharmacy Antibiotic Note  [name] is a 57 y.o. female admitted on 04/21/2018 with foot wound. She was started on oral doxy a few days ago and was referred to ED from wound care clinic.  Day #4 of abx for DFI with cellulitis. Was de-escalated to cefazolin but recommended to broaden by Ortho. Afebrile, WBC 14.8.  Vancomycin trough = 14 on 1000 mg IV every 12 hours. Therapeutic for current goal of 10-15 mcg/mL.   Plan: Continue Vancomycin 1000 mg IV every 12 hours.  Continue Zosyn 3.375 gm IV q8h (4 hour infusion).  Monitor clinical picture, renal function, VT prn F/U C&S, abx deescalation / LOT   Temp (24hrs), Avg:98.8 F (37.1 C), Min:98.1 F (36.7 C), Max:99.6 F (37.6 C)  Recent Labs  Lab 04/21/18 1350  04/21/18 1706 04/22/18 0331 04/23/18 0625 04/24/18 0355 04/25/18 0555 04/26/18 0635 04/26/18 1251  WBC  --    < > 14.2* 12.9* 14.0* 14.8* 13.2* 12.4*  --   CREATININE  --   --  1.07* 1.23* 1.01* 1.00  --  0.95  --   LATICACIDVEN 1.83  --   --   --   --   --   --   --   --   VANCOTROUGH  --   --   --   --   --   --   --   --  14*   < > = values in this interval not displayed.     Link SnufferJessica Davion Meara, PharmD, BCPS, BCCCP Clinical Pharmacist Clinical phone 04/26/2018 until 3:[phone]- #[phone] Please refer to [facility] and [facility] for pharmacist numbers.  04/26/2018 2:25 PM

## 2018-04-26 NOTE — Progress Notes (Signed)
PROGRESS NOTE    [name]  ZOX:[id number] DOB: Jul 08, 1961 DOA: 04/21/2018 PCP: Wilmer Floor., MD    Brief Narrative:  57 year old female who presented with right foot painfulwound, associated with fever and malaise. She does have significant past medical history for obesity, type 2 diabetes mellitus, hypertension, depression, and anxiety. Reported 7 days of malaise andsubjective fevers, 3 days agoshe realized had a large ulcer at the right heel, she was seen in the outpatient setting, had doxycycline prescribed, with no improvement of her symptoms. The initial physical examination blood pressure 145/58, heart rate 106, respiratory rate 15,oxygen saturation 98%. Moist mucous membranes, lungs clear to auscultation, heart S1-S2 present and rhythmic, abdomen protuberant nontender, right foot with edema, erythema, large ulcerated lesion at the heel with necrotic base, erythema in the inner ankle.Sodium 136, potassium 4.5, chloride 101, bicarb 21, glucose 128, BUN 10, creatinine 1.0, CRP 21.2, white count 14.2, hemoglobin 10.0, hematocrit 34.4, platelets 291.Right foot radiograph large plantar calcaneal ulcer without acute bony abnormalities. Displaced intra-articular fracture at base of middle phalanx right second toe.Chest x-ray negative for infiltrates. EKG normal sinus rhythm, normal axis.  Patient was admitted to the hospital with working diagnosis of diabetic right foot ulcer,failed outpatient therapy.     Assessment & Plan:   Principal Problem:   Diabetic foot infection (HCC) Active Problems:   Insulin-requiring or dependent type II diabetes mellitus (HCC)   Morbid (severe) obesity due to excess calories (HCC)   Depression with anxiety   Essential hypertension   Normocytic anemia   Mild renal insufficiency   Sepsis due to cellulitis (HCC)   Moderate protein malnutrition (HCC)   Adult BMI 50.0-59.9 kg/sq m (HCC)   1. Right foot ulcer, diabetic foot  complicated with sepsis.For now will continue antibiotic therapy with IV vancomycin and IV zosyn, wbc continue to trend down to 12,4. Foot MRI with no bony involvement, per surgery she may need IV antibiotics for 6 weeks, will consult ID for further recommendations, regards of therapy. No surgical intervention per orthopedics on this admission.   2. HTN.On amlodipine and metoprolol for blood pressure control.   3. T2DM.Capillary glucose 305, 196, 179, 156, 230. Will increase lantus to 45 units bid and continue insulin sliding scale for glucose cover and monitoring, will target more tight control in the setting of infected diabetic foot.   4. Morbid obesity. Nutritional suppelements withglucerna, and juven.Will need outpatient follow up.   5. Depression.Continue with bupropion, duloxetine, seroquel and latuda.  DVT prophylaxis:enoxaparin Code Status:full Family Communication:no family at the beside Disposition Plan/ discharge barriers:pending ID recommendations.   Consultants:    Procedures:    Antimicrobials:  Vancomycin   Zosyn   Subjective: Patient continue to have pain on her right ankle, no nausea or vomiting, no chest pain or dyspnea.   Objective: Vitals:   04/25/18 2208 04/26/18 0446 04/26/18 0711 04/26/18 0818  BP: (!) 155/61 (!) 144/55    Pulse: 80 70    Resp: 20 20    Temp: 99.6 F (37.6 C) 98.7 F (37.1 C)    TempSrc: Oral Oral    SpO2: 94% 96% 92%   Weight:    (!) 167.3 kg (368 lb 13.3 oz)  Height:        Intake/Output Summary (Last 24 hours) at 04/26/2018 1121 Last data filed at 04/26/2018 1054 Gross per 24 hour  Intake 1020 ml  Output -  Net 1020 ml   Filed Weights   04/21/18 1715 04/26/18 0818  Weight: Marland Kitchen)  162.8 kg (359 lb) (!) 167.3 kg (368 lb 13.3 oz)    Examination:   General: Not in pain or dyspnea, deconditioned  Neurology: Awake and alert, non focal  E ENT: mild pallor, no icterus, oral mucosa  moist Cardiovascular: No JVD. S1-S2 present, rhythmic, no gallops, rubs, or murmurs. No lower extremity edema. Pulmonary: vesicular breath sounds bilaterally, adequate air movement, no wheezing, rhonchi or rales. Gastrointestinal. Abdomen protuberant, no organomegaly, non tender, no rebound or guarding Skin. Right ankle with dressing in place, has erythema at the ankle, with large stage 3/4 ulcer at the heal.  Musculoskeletal: no joint deformities     Data Reviewed: I have personally reviewed following labs and imaging studies  CBC: Recent Labs  Lab 04/21/18 1706 04/22/18 0331 04/23/18 0625 04/24/18 0355 04/25/18 0555 04/26/18 0635  WBC 14.2* 12.9* 14.0* 14.8* 13.2* 12.4*  NEUTROABS 11.3* 8.4*  --   --   --  8.3*  HGB 10.0* 9.0* 8.6* 9.0* 8.5* 8.6*  HCT 34.4* 30.2* 28.6* 29.9* 28.0* 28.7*  MCV 83.3 81.8 81.5 81.7 81.6 82.2  PLT 291 280 304 345 370 376   Basic Metabolic Panel: Recent Labs  Lab 04/21/18 1706 04/22/18 0331 04/23/18 0625 04/24/18 0355 04/26/18 0635  NA 136 138 139 139 138  K 4.5 3.5 3.9 4.0 3.7  CL 101 103 106 102 102  CO2 21* 25 26 28 27   GLUCOSE 128* 173* 181* 203* 161*  BUN 10 13 15 17 14   CREATININE 1.07* 1.23* 1.01* 1.00 0.95  CALCIUM 8.7* 8.3* 8.8* 9.2 8.7*   GFR: Estimated Creatinine Clearance: 109.7 mL/min (by C-G formula based on SCr of 0.95 mg/dL). Liver Function Tests: No results for input(s): AST, ALT, ALKPHOS, BILITOT, PROT, ALBUMIN in the last 168 hours. No results for input(s): LIPASE, AMYLASE in the last 168 hours. No results for input(s): AMMONIA in the last 168 hours. Coagulation Profile: No results for input(s): INR, PROTIME in the last 168 hours. Cardiac Enzymes: No results for input(s): CKTOTAL, CKMB, CKMBINDEX, TROPONINI in the last 168 hours. BNP (last 3 results) No results for input(s): PROBNP in the last 8760 hours. HbA1C: No results for input(s): HGBA1C in the last 72 hours. CBG: Recent Labs  Lab 04/25/18 1557  04/25/18 2010 04/25/18 2358 04/26/18 0415 04/26/18 0739  GLUCAP 288* 305* 196* 179* 156*   Lipid Profile: No results for input(s): CHOL, HDL, LDLCALC, TRIG, CHOLHDL, LDLDIRECT in the last 72 hours. Thyroid Function Tests: No results for input(s): TSH, T4TOTAL, FREET4, T3FREE, THYROIDAB in the last 72 hours. Anemia Panel: No results for input(s): VITAMINB12, FOLATE, FERRITIN, TIBC, IRON, RETICCTPCT in the last 72 hours.    Radiology Studies: I have reviewed all of the imaging during this hospital visit personally     Scheduled Meds: . amLODipine  5 mg Oral Daily  . buPROPion  200 mg Oral BID  . collagenase   Topical Daily  . [START ON 04/27/2018] collagenase   Topical Daily  . DULoxetine  60 mg Oral Daily  . feeding supplement (PRO-STAT SUGAR FREE 64)  30 mL Oral BID  . insulin aspart  0-9 Units Subcutaneous Q4H  . insulin glargine  40 Units Subcutaneous BID  . loratadine  10 mg Oral Daily  . lurasidone  20 mg Oral QHS  . metoprolol tartrate  50 mg Oral BID  . mometasone-formoterol  2 puff Inhalation BID  . nutrition supplement (JUVEN)  1 packet Oral BID BM  . pantoprazole  40 mg Oral Daily  .  pregabalin  675 mg Oral QHS  . QUEtiapine  100 mg Oral QHS  . sodium chloride flush  3 mL Intravenous Q12H   Continuous Infusions: . sodium chloride    . piperacillin-tazobactam (ZOSYN)  IV 3.375 g (04/26/18 0431)  . vancomycin 1,000 mg (04/26/18 0202)     LOS: 5 days        Pearson Picou Annett Gula, MD [facility] Pager [phone]

## 2018-04-26 NOTE — Consult Note (Signed)
ORTHOPAEDIC CONSULTATION  REQUESTING PHYSICIAN: Coralie Keens,*  Chief Complaint: Right heel ulcer with cellulitis.  HPI: [name] is a 57 y.o. female who presents with ulcer and cellulitis right heel.  Patient is unsure when this happened.  She states she is unable to see the or touch her feet.  Patient has a history of uncontrolled type 2 diabetes.  She has a history of tobacco use.  Past Medical History:  Diagnosis Date  . Anemia    iron deficiency  . Anxiety   . COPD (chronic obstructive pulmonary disease) (HCC)   . Depression   . Diabetes mellitus    type 2  . Diabetic foot ulcer (HCC) 04/2018   right ankle  . GERD (gastroesophageal reflux disease)   . Headache(784.0)   . History of colonic polyps   . Hyperlipidemia   . Hypertension   . Neuropathy   . Peptic ulcer disease    Past Surgical History:  Procedure Laterality Date  . ESOPHAGOGASTRODUODENOSCOPY  10/11/2011   Procedure: ESOPHAGOGASTRODUODENOSCOPY (EGD);  Surgeon: Erick Blinks, MD;  Location: [facility] ENDOSCOPY;  Service: Gastroenterology;  Laterality: N/A;  . TUBAL LIGATION     Social History   Socioeconomic History  . Marital status: Divorced    Spouse name: Not on file  . Number of children: Not on file  . Years of education: Not on file  . Highest education level: Not on file  Occupational History  . Not on file  Social Needs  . Financial resource strain: Not on file  . Food insecurity:    Worry: Not on file    Inability: Not on file  . Transportation needs:    Medical: Not on file    Non-medical: Not on file  Tobacco Use  . Smoking status: Current Every Day Smoker    Packs/day: 1.00    Years: 25.00    Pack years: 25.00    Types: Cigarettes  . Smokeless tobacco: Never Used  Substance and Sexual Activity  . Alcohol use: No  . Drug use: No  . Sexual activity: Not Currently  Lifestyle  . Physical activity:    Days per week: Not on file    Minutes per session: Not on file  .  Stress: Not on file  Relationships  . Social connections:    Talks on phone: Not on file    Gets together: Not on file    Attends religious service: Not on file    Active member of club or organization: Not on file    Attends meetings of clubs or organizations: Not on file    Relationship status: Not on file  Other Topics Concern  . Not on file  Social History Narrative   Patient is divorced and lives alone. She is on disability and has 3 daughter who she does not speak to often. She does not have any good friends and mostly stays at home. She has had multiple psychiatric admissions for depression and suicidality.   Family History  Problem Relation Age of Onset  . Alcohol abuse Other   . Drug abuse Other   . Arthritis Other   . Diabetes Other   . Depression Other   . Hypertension Other   . Kidney disease Other   . Cancer Other        Breast,Colon,Lung, and Prostate Cancer  . Stomach cancer Father 43  . Heart attack Brother 51  . Diabetes type II Unknown  brother, both parents   - negative except otherwise stated in the family history section No Known Allergies Prior to Admission medications   Medication Sig Start Date End Date Taking? Authorizing Provider  acetaminophen (TYLENOL) 500 MG tablet Take 1,000-1,050 mg by mouth daily as needed for moderate pain.   Yes [provider]  amLODipine (NORVASC) 5 MG tablet Take 5 mg by mouth daily. 03/11/18  Yes [provider]  buPROPion (WELLBUTRIN SR) 200 MG 12 hr tablet Take 200 mg by mouth 2 (two) times daily. 04/16/18  Yes [provider]  doxycycline (VIBRA-TABS) 100 MG tablet Take 100 mg by mouth 2 (two) times daily. 04/20/18 04/27/18 Yes [provider]  DULoxetine (CYMBALTA) 30 MG capsule Take 30 mg by mouth daily. 04/16/18  Yes [provider]  DULoxetine (CYMBALTA) 60 MG capsule Take 60 mg by mouth daily.    Yes [provider]  fluticasone (FLONASE) 50 MCG/ACT nasal spray  Place 2 sprays into both nostrils daily. 02/16/18  Yes [provider]  HUMALOG KWIKPEN 100 UNIT/ML KiwkPen Inject 0-25 Units as directed 3 (three) times daily as needed. Per sliding scale 02/10/18  Yes [provider]  ibuprofen (ADVIL,MOTRIN) 200 MG tablet Take 600 mg by mouth 3 (three) times daily as needed for moderate pain.   Yes [provider]  insulin glargine (LANTUS) 100 UNIT/ML injection Inject 80 Units into the skin 2 (two) times daily.    Yes [provider]  INVOKANA 300 MG TABS tablet Take 300 mg by mouth daily. 03/19/18  Yes [provider]  LATUDA 20 MG TABS tablet Take 20 mg by mouth at bedtime. 04/16/18  Yes [provider]  LYRICA 225 MG capsule Take 675 mg by mouth at bedtime. 04/09/18  Yes [provider]  meloxicam (MOBIC) 7.5 MG tablet Take 7.5 mg by mouth at bedtime. 03/29/18  Yes [provider]  metFORMIN (GLUCOPHAGE) 1000 MG tablet Take 1,000 mg by mouth 2 (two) times daily with a meal.   Yes [provider]  metoprolol tartrate (LOPRESSOR) 50 MG tablet Take 50 mg by mouth 2 (two) times daily. 02/10/18  Yes [provider]  nystatin (MYCOSTATIN) 100000 UNIT/ML suspension Take 10 mLs by mouth 4 (four) times daily. Pt. States they do not measure their dose and hat they "just takes a swig" 03/21/18  Yes [provider]  omeprazole (PRILOSEC) 40 MG capsule Take 40 mg by mouth daily. 03/19/18  Yes [provider]  QUEtiapine (SEROQUEL) 100 MG tablet Take 100 mg by mouth at bedtime. 04/16/18  Yes [provider]  SYMBICORT 80-4.5 MCG/ACT inhaler Inhale 2 puffs into the lungs 2 (two) times daily. 03/19/18  Yes [provider]  VICTOZA 18 MG/3ML SOPN Inject into the skin daily. 1.8 ml 02/10/18  Yes [provider]  insulin aspart (NOVOLOG FLEXPEN) 100 UNIT/ML injection Inject 10-25 Units into the skin 3 (three) times daily before meals. As directed per sliding  scale 02/10/11 02/10/12  Etta Grandchild, MD   No results found. - pertinent xrays, CT, MRI studies were reviewed and independently interpreted  Positive ROS: All other systems have been reviewed and were otherwise negative with the exception of those mentioned in the HPI and as above.  Physical Exam: General: Alert, no acute distress Psychiatric: Patient is competent for consent with normal mood and affect Lymphatic: No axillary or cervical lymphadenopathy Cardiovascular: No pedal edema Respiratory: No cyanosis, no use of accessory musculature GI: No organomegaly, abdomen  is soft and non-tender    Images:  @ENCIMAGES @  Labs:  Lab Results  Component Value Date   HGBA1C 8.3 (H) 04/21/2018   HGBA1C 12.6 (H) 10/08/2011   HGBA1C 11.3 (H) 07/27/2011   ESRSEDRATE 105 (H) 04/21/2018   CRP 18.3 (H) 04/23/2018   CRP 19.6 (H) 04/22/2018   CRP 21.2 (H) 04/21/2018   REPTSTATUS PENDING 04/21/2018   GRAMSTAIN  10/28/2011    MODERATE WBC PRESENT,BOTH PMN AND MONONUCLEAR NO SQUAMOUS EPITHELIAL CELLS SEEN NO ORGANISMS SEEN   CULT  04/21/2018    NO GROWTH 4 DAYS Performed at [facility], [address] [place], [place] [address]     Lab Results  Component Value Date   ALBUMIN 3.6 10/08/2011   ALBUMIN 3.3 (L) 07/26/2011   ALBUMIN 2.7 (L) 07/10/2011   PREALBUMIN 8.8 (L) 04/21/2018    Neurologic: Patient does not have protective sensation bilateral lower extremities.   MUSCULOSKELETAL:   Skin: Examination patient has a thin black eschar just proximal to the heel pad this possibly 2 x 4 cm and superficial.  The cellulitis around this area appears to be resolving.  Patient has a strong dorsalis pedis pulse her ankle-brachial indices shows good circulation.  Review of the MRI scan shows no evidence of abscess or osteomyelitis.  Assessment: Assessment: Diabetic insensate neuropathy with poor glucose control history of tobacco use history of protein caloric malnutrition with  cellulitis and ulceration to the right hindfoot.  Plan: Plan: I feel that a course of 6 weeks of IV antibiotics would be appropriate.  Would recommend a PICC line for home IV.  Patient would also need home health nursing for 3 times a week Santyl dressing changes to the heel ulcer.  Patient states she is unable to reach or see her foot she states she is unable to care for her foot.  [facility] and she is to be nonweightbearing on the right heel until the ulcer has resolved.  I will follow-up in the office for serial debridement after initiation of santyl treatment.  Thank you for the consult and the opportunity to see Ms. Devoria GlassingHuffman  Marcus Duda, MD [facility] [phone] 9:16 AM

## 2018-04-26 NOTE — Progress Notes (Signed)
0840 ordered PRAFO boot per order.

## 2018-04-26 NOTE — Consult Note (Signed)
[facility]    Date of Admission:  04/21/2018   Total days of antibiotics: 6        Day 6 of Vancomycin         Day 3 of Zosyn       Reason for Consult: Diabetic Foot ulcer treatment    Referring Provider: Dr. Erin HearingMauricio Arrien, MD Primary Care Provider: Dr. Junious DresserStephen Campbell, MD  Assessment: [name] is a 57 y/o female with a PMH of uncontrolled T2DM, morbid obesity, and HTN who is currently admitted for a right foot diabetic ulcer with overlying cellulitis.   Diabetic foot ulcer with overlying cellulitis:  Patient regularly sees a wound care specialist in [employer] and he did not notice an ulcer on the right foot two weeks ago, so this ulcer did develop rather quickly. She [facility] ED and was discharged with Doxycycline, however, the next day she was sent to the [facility] ED by her wound care specialist. Although previous notes state she "failed" doxycycline outpatient treatment, she did not actually ever start the treatment.   MRI on 07/19 did not show any evidence of abscess or osteomyelitis, only overlying cellulitis. At this time, there is no indication for IV antibiotic treatment without any bony involvement. We recommend a 2 week course of oral Augmentin and Doxycyline. This was discussed with the patient and she understands why she does not require IV antibiotics. We also discussed the importance of quitting smoking and controlling her diabetes. She is determined to at least quit smoking. Orthopedics has been following and recommended serial debridements in the office after Santyl treatment.   Patient has been having increased diarrhea, however with being on Vancomycin for the past 6 days, this is unlikely C.diff. We recommended Imodium if she requires better control of her diarrhea.    Plan: 1. Order Doxycycline  2. Order Augmentin  3. Discontinue Vancomycin and Zosyn    Principal Problem:   Diabetic foot infection (HCC) Active  Problems:   Insulin-requiring or dependent type II diabetes mellitus (HCC)   Morbid (severe) obesity due to excess calories (HCC)   Depression with anxiety   Essential hypertension   Normocytic anemia   Mild renal insufficiency   Sepsis due to cellulitis (HCC)   Moderate protein malnutrition (HCC)   Adult BMI 50.0-59.9 kg/sq m (HCC)   Scheduled Meds: . amLODipine  5 mg Oral Daily  . buPROPion  200 mg Oral BID  . collagenase   Topical Daily  . [START ON 04/27/2018] collagenase   Topical Daily  . DULoxetine  60 mg Oral Daily  . feeding supplement (PRO-STAT SUGAR FREE 64)  30 mL Oral BID  . insulin aspart  0-9 Units Subcutaneous Q4H  . insulin glargine  40 Units Subcutaneous BID  . loratadine  10 mg Oral Daily  . lurasidone  20 mg Oral QHS  . metoprolol tartrate  50 mg Oral BID  . mometasone-formoterol  2 puff Inhalation BID  . nutrition supplement (JUVEN)  1 packet Oral BID BM  . pantoprazole  40 mg Oral Daily  . pregabalin  675 mg Oral QHS  . QUEtiapine  100 mg Oral QHS  . sodium chloride flush  3 mL Intravenous Q12H   Continuous Infusions: . sodium chloride    . piperacillin-tazobactam (ZOSYN)  IV Stopped (04/26/18 0834)  . vancomycin 1,000 mg (04/26/18 0202)   PRN Meds:.sodium chloride, acetaminophen **OR** acetaminophen, albuterol, bisacodyl, HYDROcodone-acetaminophen, ketorolac,  ondansetron **OR** ondansetron (ZOFRAN) IV, senna-docusate, sodium chloride flush  HPI: [name] is a 57 y.o. female with a PMH of uncontrolled T2DM, morbid obesity, and HTN who is currently admitted for a right foot diabetic ulcer with overlying cellulitis. [name] states approximately one week ago, she began to feel malaise and fatigued, with chills. On Wednesday while showering, she noticed an ulcer on her right foot that had black material on it with pus-like discharge. She is unsure if there was a foul smell. At this point, she went to the [facility] ED, where she was febrile and  discharged with a prescription for Doxycycline. The next day, she went to her wound care specialist, who sent her to the ED at [facility]. She regularly sees this wound care specialist for callus on her left right toe, plantar surface. She last saw him two weeks ago, at which time he did not notice an ulcer on the right foot. [name] denies other symptoms, including SOB, chest pain, cough, nausea, vomiting, abdominal pain, diarrhea, left foot or leg pain, and pain other than in the right foot.   Upon arrival to the [facility] ED, patient was started on Vancomycin. She had an xray of the foot that did not show any signs of bony involvement.   Today, she feels her fever, chills and fatigue is much improved. She still denies chest pain, SOB, cough, nausea, vomiting, abdominal pain. She has been having diarrhea with increasing frequency, but denies melena and hematochezia.   Review of Systems: Review of Systems  Constitutional: Positive for chills (resolved), fever (resolved) and malaise/fatigue (improving).  Respiratory: Negative for cough, sputum production, shortness of breath and wheezing.   Cardiovascular: Negative for chest pain and palpitations.  Gastrointestinal: Positive for diarrhea (new onset since admission). Negative for abdominal pain, blood in stool, constipation, melena, nausea and vomiting.  Musculoskeletal: Positive for myalgias (Right heel pain with radiation up the right calf).    Past Medical History:  Diagnosis Date  . Anemia    iron deficiency  . Anxiety   . COPD (chronic obstructive pulmonary disease) (HCC)   . Depression   . Diabetes mellitus    type 2  . Diabetic foot ulcer (HCC) 04/2018   right ankle  . GERD (gastroesophageal reflux disease)   . Headache(784.0)   . History of colonic polyps   . Hyperlipidemia   . Hypertension   . Neuropathy   . Peptic ulcer disease     Social History   Tobacco Use  . Smoking status: Current Every Day Smoker     Packs/day: 1.00    Years: 25.00    Pack years: 25.00    Types: Cigarettes  . Smokeless tobacco: Never Used  Substance Use Topics  . Alcohol use: No  . Drug use: No    Family History  Problem Relation Age of Onset  . Alcohol abuse Other   . Drug abuse Other   . Arthritis Other   . Diabetes Other   . Depression Other   . Hypertension Other   . Kidney disease Other   . Cancer Other        Breast,Colon,Lung, and Prostate Cancer  . Stomach cancer Father 25  . Heart attack Brother 51  . Diabetes type II Unknown        brother, both parents   No Known Allergies  OBJECTIVE: Blood pressure (!) 144/55, pulse 70, temperature 98.7 F (37.1 C), temperature source Oral, resp. rate 20, height 5'  7" (1.702 m), weight (!) 167.3 kg (368 lb 13.3 oz), SpO2 92 %.  Physical Exam  Constitutional: She is cooperative.  Non-toxic appearance. No distress.  Morbid obesity.  Cardiovascular: Normal rate, regular rhythm and normal heart sounds.  Pulmonary/Chest: Effort normal and breath sounds normal. No respiratory distress. She has no wheezes. She has no rales.  Abdominal: Soft. Bowel sounds are normal. She exhibits no distension and no mass. There is no tenderness. There is no guarding.  Musculoskeletal:       Left foot: There is no tenderness and no deformity.  Right foot: wrapped with gauze and ace band.  Neurological: She is alert.  Skin:  Left great toe, plantar surface: round, 1 cm callus with laceration around it circumferentially. No surrounding erythema.     Lab Results Lab Results  Component Value Date   WBC 12.4 (H) 04/26/2018   HGB 8.6 (L) 04/26/2018   HCT 28.7 (L) 04/26/2018   MCV 82.2 04/26/2018   PLT 376 04/26/2018    Lab Results  Component Value Date   CREATININE 0.95 04/26/2018   BUN 14 04/26/2018   NA 138 04/26/2018   K 3.7 04/26/2018   CL 102 04/26/2018   CO2 27 04/26/2018    Lab Results  Component Value Date   ALT 12 10/08/2011   AST 13 10/08/2011   ALKPHOS  122 (H) 10/08/2011   BILITOT 0.3 10/08/2011     Microbiology: Recent Results (from the past 240 hour(s))  Culture, blood (Routine X 2) w Reflex to ID Panel     Status: None (Preliminary result)   Collection Time: 04/21/18  5:48 PM  Result Value Ref Range Status   Specimen Description BLOOD RIGHT ANTECUBITAL  Final   Special Requests   Final    BOTTLES DRAWN AEROBIC AND ANAEROBIC Blood Culture adequate volume   Culture   Final    NO GROWTH 4 DAYS Performed at [facility], [address] [place], [place] [address]    Report Status PENDING  Incomplete  Culture, blood (Routine X 2) w Reflex to ID Panel     Status: None (Preliminary result)   Collection Time: 04/21/18  5:53 PM  Result Value Ref Range Status   Specimen Description BLOOD RIGHT ANTECUBITAL  Final   Special Requests   Final    BOTTLES DRAWN AEROBIC AND ANAEROBIC Blood Culture adequate volume   Culture   Final    NO GROWTH 4 DAYS Performed at [facility], [address] [place], [place] [address]    Report Status PENDING  Incomplete    Verdene Lennert, MS4  Dr. Staci Righter, MD [facility] [phone] pager    04/26/2018, 11:57 AM

## 2018-04-26 NOTE — Care Management Important Message (Signed)
Important Message  Patient Details  Name: [name] MRN: [id number] Date of Birth: May 03, 1961   Medicare Important Message Given:  Yes    Ginny Loomer Stefan ChurchBratton 04/26/2018, 9:49 AM

## 2018-04-26 NOTE — Plan of Care (Signed)
  Problem: Education: Goal: Knowledge of General Education information will improve Description: Including pain rating scale, medication(s)/side effects and non-pharmacologic comfort measures Outcome: Progressing   Problem: Health Behavior/Discharge Planning: Goal: Ability to manage health-related needs will improve Outcome: Progressing   Problem: Coping: Goal: Level of anxiety will decrease Outcome: Progressing   Problem: Elimination: Goal: Will not experience complications related to bowel motility Outcome: Progressing Goal: Will not experience complications related to urinary retention Outcome: Progressing   

## 2018-04-27 DIAGNOSIS — Z794 Long term (current) use of insulin: Secondary | ICD-10-CM

## 2018-04-27 DIAGNOSIS — E119 Type 2 diabetes mellitus without complications: Secondary | ICD-10-CM

## 2018-04-27 DIAGNOSIS — D649 Anemia, unspecified: Secondary | ICD-10-CM

## 2018-04-27 DIAGNOSIS — F418 Other specified anxiety disorders: Secondary | ICD-10-CM

## 2018-04-27 DIAGNOSIS — L0390 Cellulitis, unspecified: Secondary | ICD-10-CM

## 2018-04-27 DIAGNOSIS — A419 Sepsis, unspecified organism: Principal | ICD-10-CM

## 2018-04-27 LAB — BASIC METABOLIC PANEL
Anion gap: 8 (ref 5–15)
BUN: 14 mg/dL (ref 6–20)
CALCIUM: 8.9 mg/dL (ref 8.9–10.3)
CHLORIDE: 103 mmol/L (ref 98–111)
CO2: 29 mmol/L (ref 22–32)
CREATININE: 0.92 mg/dL (ref 0.44–1.00)
GFR calc non Af Amer: >60 mL/min (ref >60)
Glucose, Bld: 141 mg/dL — ABNORMAL HIGH (ref 70–99)
Potassium: 3.6 mmol/L (ref 3.5–5.1)
SODIUM: 140 mmol/L (ref 135–145)

## 2018-04-27 LAB — CBC WITH DIFFERENTIAL/PLATELET
Abs Immature Granulocytes: 0.2 10*3/uL — ABNORMAL HIGH (ref 0.0–0.1)
BASOS ABS: 0 10*3/uL (ref 0.0–0.1)
Basophils Relative: 0 %
EOS PCT: 2 %
Eosinophils Absolute: 0.2 10*3/uL (ref 0.0–0.7)
HCT: 27.9 % — ABNORMAL LOW (ref 36.0–46.0)
HEMOGLOBIN: 8.4 g/dL — AB (ref 12.0–15.0)
Immature Granulocytes: 2 %
LYMPHS PCT: 18 %
Lymphs Abs: 1.8 10*3/uL (ref 0.7–4.0)
MCH: 24.6 pg — AB (ref 26.0–34.0)
MCHC: 30.1 g/dL (ref 30.0–36.0)
MCV: 81.8 fL (ref 78.0–100.0)
MONO ABS: 0.9 10*3/uL (ref 0.1–1.0)
Monocytes Relative: 9 %
Neutro Abs: 7.3 10*3/uL (ref 1.7–7.7)
Neutrophils Relative %: 69 %
PLATELETS: 345 10*3/uL (ref 150–400)
RBC: 3.41 MIL/uL — AB (ref 3.87–5.11)
RDW: 17.2 % — AB (ref 11.5–15.5)
WBC: 10.5 10*3/uL (ref 4.0–10.5)

## 2018-04-27 LAB — GLUCOSE, CAPILLARY
GLUCOSE-CAPILLARY: 155 mg/dL — AB (ref 70–99)
GLUCOSE-CAPILLARY: 163 mg/dL — AB (ref 70–99)
Glucose-Capillary: 125 mg/dL — ABNORMAL HIGH (ref 70–99)
Glucose-Capillary: 273 mg/dL — ABNORMAL HIGH (ref 70–99)
Glucose-Capillary: 279 mg/dL — ABNORMAL HIGH (ref 70–99)

## 2018-04-27 MED ORDER — AMOXICILLIN-POT CLAVULANATE 875-125 MG PO TABS
1.0000 | ORAL_TABLET | Freq: Two times a day (BID) | ORAL | 0 refills | Status: DC
Start: 2018-04-27 — End: 2020-06-05

## 2018-04-27 MED ORDER — COLLAGENASE 250 UNIT/GM EX OINT: TOPICAL_OINTMENT | Freq: Every day | CUTANEOUS | 0 refills | Status: DC

## 2018-04-27 MED ORDER — LOPERAMIDE HCL 2 MG PO CAPS
2.0000 mg | ORAL_CAPSULE | Freq: Once | ORAL | Status: AC
  Administered 2018-04-27: 2 mg via ORAL
  Filled 2018-04-27: qty 1

## 2018-04-27 MED ORDER — PRO-STAT SUGAR FREE PO LIQD: 30.0000 mL | Freq: Two times a day (BID) | ORAL | 0 refills | Status: DC

## 2018-04-27 MED ORDER — DOXYCYCLINE HYCLATE 100 MG PO TABS: 100.0000 mg | ORAL_TABLET | Freq: Two times a day (BID) | ORAL | 0 refills | Status: DC

## 2018-04-27 NOTE — Evaluation (Signed)
Physical Therapy Evaluation Patient Details Name: [name] MRN: [id number] DOB: 05/21/1961 Today's Date: 04/27/2018   History of Present Illness  57 year old female who presented with right foot painful wound, associated with fever and malaise.  She does have significant past medical history for obesity, type 2 diabetes mellitus, hypertension, depression, and anxiety.  Reported 7 days of malaise and subjective fevers, 3 days ago she realized had a large ulcer at the right heel, she was seen in the outpatient setting, had doxycycline prescribed, with no improvement of her symptoms. PRAFO ordered  Clinical Impression   Pt admitted with above diagnosis. Pt currently with functional limitations due to the deficits listed below (see PT Problem List). Independent prior to admission; Presents with gait abnormality, cannot bear weight through R heel while foot is healing; Managing to stay on R toes with gait short distances; fatigues quickly;  Ntoed not great fit of PRAFO with walking; paged Ortho Tech, who will ask for [employer] to come see pt; Pt will benefit from skilled PT to increase their independence and safety with mobility to allow discharge to the venue listed below.       Follow Up Recommendations Home health PT  [facility] transport services (?[facility]) Does pt qualify for Meals on Wheels?    Equipment Recommendations  Rolling walker with 5" wheels;Other (comment)(Bariatric)    Recommendations for Other Services       Precautions / Restrictions Restrictions Weight Bearing Restrictions: Yes RLE Weight Bearing: (Non weight bearing through heel)      Mobility  Bed Mobility Overal bed mobility: Needs Assistance Bed Mobility: Supine to Sit     Supine to sit: HOB elevated;Min guard     General bed mobility comments: MInguard for safety  Transfers Overall transfer level: Needs assistance Equipment used: Rolling walker (2 wheeled) Transfers: Sit to/from Stand Sit to  Stand: Min guard         General transfer comment: Minguard for safety; overall good rise; cues for hand placement  Ambulation/Gait Ambulation/Gait assistance: Min guard Gait Distance (Feet): 50 Feet(chair pushed behind for safety with initial walk) Assistive device: Rolling walker (2 wheeled)(and PRAFO R foot) Gait Pattern/deviations: Decreased step length - right;Decreased step length - left     General Gait Details: Cues to self-monitor for activity tolerance, close guard for keeping weight off of R heel; noted DOE 2/4 towards end of walk  Information systems manager Rankin (Stroke Patients Only)       Balance                                             Pertinent Vitals/Pain Pain Assessment: Faces Faces Pain Scale: Hurts little more Pain Location: R heel Pain Descriptors / Indicators: Aching;Grimacing;Guarding;Sore Pain Intervention(s): Monitored during session;Repositioned    Home Living Family/patient expects to be discharged to:: Private residence Living Arrangements: Alone Available Help at Discharge: Family;Available PRN/intermittently(Daughters check in; they both work) Type of Home: Apartment(Hnadicap accessible apartment) Home Access: Level entry     Home Layout: One level Home Equipment: Grab bars - tub/shower;Grab bars - toilet      Prior Function Level of Independence: Independent               Hand Dominance        Extremity/Trunk Assessment  Upper Extremity Assessment Upper Extremity Assessment: Overall WFL for tasks assessed    Lower Extremity Assessment Lower Extremity Assessment: RLE deficits/detail RLE Deficits / Details: Foot and ankle wrapped; ROM limited grossly by body habitus; able to walk on toes for short distances in Principal FinancialPRAFO       Communication   Communication: No difficulties  Cognition Arousal/Alertness: Awake/alert Behavior During Therapy: WFL for tasks  assessed/performed Overall Cognitive Status: Within Functional Limits for tasks assessed                                        General Comments      Exercises     Assessment/Plan    PT Assessment Patient needs continued PT services  PT Problem List Decreased strength;Decreased range of motion;Decreased activity tolerance;Decreased balance;Decreased mobility;Decreased knowledge of use of DME;Decreased knowledge of precautions;Pain;Obesity       PT Treatment Interventions DME instruction;Gait training;Functional mobility training;Therapeutic activities;Therapeutic exercise;Balance training;Patient/family education    PT Goals (Current goals can be found in the Care Plan section)  Acute Rehab PT Goals Patient Stated Goal: "foot to heal" PT Goal Formulation: With patient Time For Goal Achievement: 05/11/18 Potential to Achieve Goals: Good    Frequency Min 3X/week   Barriers to discharge Decreased caregiver support Must be modified independent at home    Co-evaluation               AM-PAC PT "6 Clicks" Daily Activity  Outcome Measure Difficulty turning over in bed (including adjusting bedclothes, sheets and blankets)?: A Little Difficulty moving from lying on back to sitting on the side of the bed? : A Little Difficulty sitting down on and standing up from a chair with arms (e.g., wheelchair, bedside commode, etc,.)?: A Little Help needed moving to and from a bed to chair (including a wheelchair)?: A Little Help needed walking in hospital room?: A Little Help needed climbing 3-5 steps with a railing? : A Lot 6 Click Score: 17    End of Session Equipment Utilized During Treatment: Other (comment)(PRAFO) Activity Tolerance: Patient tolerated treatment well Patient left: in chair;with call bell/phone within reach;with chair alarm set Nurse Communication: Mobility status PT Visit Diagnosis: Other abnormalities of gait and mobility (R26.89)    Time:  [id number] PT Time Calculation (min) (ACUTE ONLY): 13 min   Charges:   PT Evaluation $PT Eval Moderate Complexity: 1 Mod     PT G Codes:        Van ClinesHolly Arrington Bencomo, PT  Acute Rehabilitation Services Pager [phone] Office [phone]   Levi AlandHolly H Wrangler Penning 04/27/2018, 10:40 AM

## 2018-04-27 NOTE — Progress Notes (Signed)
Discharge instructions reviewed with patient, including f/u appointments, pain control, dressing change and HH equipment. Questions answered re: pain med and foot support. Patient can speak to plan for pain control. She plans to call ortho in the morning.  Printed AVS given to patient. Patient discharged to home via wheelchair. Accompanied by family. Walker (DME) sent with patient.

## 2018-04-27 NOTE — Discharge Instructions (Signed)

## 2018-04-27 NOTE — Progress Notes (Signed)
At 1644 --Pt c/o pain to heel, requesting her pain med. Pt wanting MD to be called to request order for imodium, pt states she has been having diarrhea for 3-4 days, and doctors are aware of it. States she has had 3 BMs today, and c/o rectal area soreness. Previous nurse obtained order for preparation H pads to help sooth rectal area (waiting for pharmacy to send). Pt given a barrier cream/ointment to apply to areas also.  Pt also requesting a pain medication script from doctor.  Dr Brooks SailorsMikhail messaged requesting the above.   At 1656 -- Return call received, pt had not used much narcotic pain medication in past 24hrs, therefore the MD did not prescribe any narcotics for pain, pt has instructions for tylenol, ibuprofen and mobic for pain. Pt to be advised to call Dr Audrie Liauda's office for further pain medication request if these meds do not work.

## 2018-04-27 NOTE — Discharge Summary (Signed)
Physician Discharge Summary  [name]:[id number]:[id number] DOB: 1961-05-22 DOA: 04/21/2018  PCP: Wilmer Floorampbell, [name] D., MD  Admit date: 04/21/2018 Discharge date: 04/27/2018  Time spent: 45 minutes  Recommendations for Outpatient Follow-up:  Patient will be discharged to home with home health physical therapy.  Patient will need to follow up with primary care provider within one week of discharge.  Follow up with orthopedics, Dr. Lajoyce [name], in one week. Patient should continue medications as prescribed.  Patient should follow a heart healthy/carb modified diet.   Discharge Diagnoses:  Sepsis secondary to diabetic right foot ulcer Essential hypertension Diabetes mellitus, type II Morbid obesity Depression  Discharge Condition: Stable  Diet recommendation: heart healthy/carb modified   Filed Weights   04/21/18 1715 04/26/18 0818 04/27/18 0412  Weight: (!) 162.8 kg (359 lb) (!) 167.3 kg (368 lb 13.3 oz) (!) 167.3 kg (368 lb 14.4 oz)    History of present illness:  On 04/21/2018 by Dr. Marcial Pacasimothy Opyd [name] is a 57 y.o. female with medical history significant for morbid obesity, insulin-dependent diabetes mellitus, hypertension, depression with anxiety, and chronic left great toe ulcer, now presenting to the emergency department for evaluation of fevers, malaise, and right foot ulcer with surrounding erythema, swelling, and drainage.  Patient reports that she developed a general malaise approximately 1 week ago and has had subjective fevers for the past 4 days, but did not realize she had a large ulcer at the plantar aspect of her right foot until 2 or 3 days ago.  She was seen at another emergency department at that time and discharged home with doxycycline.  She continues to have fevers and is now experiencing pain at the right foot.  She denies chest pain or palpitations but reports increase in her chronic dyspnea without any significant cough.  Hospital Course:  Sepsis secondary to  diabetic right foot ulcer -Upon admission, patient was tachycardic, febrile with leukocytosis -Patient was placed on IV vancomycin as well as Zosyn -Leukocytosis improved, currently afebrile -MRI right foot shows large heel ulcer with cellulitis and myofascial-itis but no discrete drainable soft tissue abscess, pyomyositis, septic arthritis or osteomyelitis. -ABI: Resting left and right ankle-brachial index within normal range.  No evidence of significant lower extremity arterial disease. -blood culture show no growth to date -Infectious disease consulted and appreciated, recommended 2 weeks of oral antibiotics with doxycycline as well as Augmentin -Orthopedics, Dr. Lajoyce [name], consulted and appreciated, no surgical intervention at this time.  Will follow-up in the office for wound care -PT consulted recommended home health PT as well as RN.  Medical [name], question if patient wanted qualify for Meals on Wheels, rolling walker with 5 inch wheels-bariatric.  Essential hypertension -Continue amlodipine, metoprolol  Diabetes mellitus, type II -Hemoglobin A1c 8.3 -Continue home regimen on discharge  Morbid obesity -BMI 57.78 -Will need to follow-up with PCP for lifestyle modifications and plan  Depression -Continue bupropion, duloxetine, Seroquel, Latuda  Procedures: None  Consultations: Infectious disease  Discharge Exam: Vitals:   04/27/18 0412 04/27/18 0805  BP: (!) 140/54   Pulse: 72   Resp: 16   Temp: 98.4 F (36.9 C)   SpO2: 95% 96%     General: Well developed, well nourished, NAD, appears stated age  HEENT: NCAT, mucous membranes moist.  Neck: Supple  Cardiovascular: S1 S2 auscultated, no rubs, murmurs or gallops. Regular rate and rhythm.  Respiratory: Clear to auscultation bilaterally with equal chest rise  Abdomen: Soft, obese, nontender, nondistended, + bowel sounds  Extremities: warm dry without cyanosis clubbing or edema of LLE. RLE with  dressing in place  Neuro: AAOx3, nonfocal  Psych: Normal affect and demeanor with intact judgement and insight  Discharge Instructions Discharge Instructions    Discharge instructions   Complete by:  As directed    Patient will be discharged to home with home health physical therapy and nursing.  Patient will need to follow up with primary care provider within one week of discharge.  Follow up with orthopedics, Dr. Lajoyce Corners, in one week. Patient should continue medications as prescribed.  Patient should follow a heart healthy/carb modified diet.     Allergies as of 04/27/2018   No Known Allergies     Medication List    TAKE these medications   acetaminophen 500 MG tablet Commonly known as:  TYLENOL Take 1,000-1,050 mg by mouth daily as needed for moderate pain.   amLODipine 5 MG tablet Commonly known as:  NORVASC Take 5 mg by mouth daily.   amoxicillin-clavulanate 875-125 MG tablet Commonly known as:  AUGMENTIN Take 1 tablet by mouth every 12 (twelve) hours.   buPROPion 200 MG 12 hr tablet Commonly known as:  WELLBUTRIN SR Take 200 mg by mouth 2 (two) times daily.   collagenase ointment Commonly known as:  SANTYL Apply topically daily. Apply Santyl to right plantar foot Q day, then cover with moist gauze and dry abd pad and kerlex. Apply to right heel ulcer plus dry dressing Start taking on:  04/28/2018   doxycycline 100 MG tablet Commonly known as:  VIBRA-TABS Take 1 tablet (100 mg total) by mouth every 12 (twelve) hours. What changed:  when to take this   DULoxetine 60 MG capsule Commonly known as:  CYMBALTA Take 60 mg by mouth daily.   DULoxetine 30 MG capsule Commonly known as:  CYMBALTA Take 30 mg by mouth daily.   feeding supplement (PRO-STAT SUGAR FREE 64) Liqd Take 30 mLs by mouth 2 (two) times daily.   fluticasone 50 MCG/ACT nasal spray Commonly known as:  FLONASE Place 2 sprays into both nostrils daily.   HUMALOG KWIKPEN 100 UNIT/ML KiwkPen Generic  drug:  insulin lispro Inject 0-25 Units as directed 3 (three) times daily as needed. Per sliding scale   ibuprofen 200 MG tablet Commonly known as:  ADVIL,MOTRIN Take 600 mg by mouth 3 (three) times daily as needed for moderate pain.   insulin aspart 100 UNIT/ML injection Commonly known as:  NOVOLOG FLEXPEN Inject 10-25 Units into the skin 3 (three) times daily before meals. As directed per sliding scale   insulin glargine 100 UNIT/ML injection Commonly known as:  LANTUS Inject 80 Units into the skin 2 (two) times daily.   INVOKANA 300 MG Tabs tablet Generic drug:  canagliflozin Take 300 mg by mouth daily.   LATUDA 20 MG Tabs tablet Generic drug:  lurasidone Take 20 mg by mouth at bedtime.   LYRICA 225 MG capsule Generic drug:  pregabalin Take 675 mg by mouth at bedtime.   meloxicam 7.5 MG tablet Commonly known as:  MOBIC Take 7.5 mg by mouth at bedtime.   metFORMIN 1000 MG tablet Commonly known as:  GLUCOPHAGE Take 1,000 mg by mouth 2 (two) times daily with a meal.   metoprolol tartrate 50 MG tablet Commonly known as:  LOPRESSOR Take 50 mg by mouth 2 (two) times daily.   nystatin 100000 UNIT/ML suspension Commonly known as:  MYCOSTATIN Take 10 mLs by mouth 4 (four) times daily. Pt. States they do not  measure their dose and hat they "just takes a swig"   omeprazole 40 MG capsule Commonly known as:  PRILOSEC Take 40 mg by mouth daily.   QUEtiapine 100 MG tablet Commonly known as:  SEROQUEL Take 100 mg by mouth at bedtime.   SYMBICORT 80-4.5 MCG/ACT inhaler Generic drug:  budesonide-formoterol Inhale 2 puffs into the lungs 2 (two) times daily.   VICTOZA 18 MG/3ML Sopn Generic drug:  liraglutide Inject into the skin daily. 1.8 ml            Durable Medical Equipment  (From admission, onward)        Start     Ordered   04/27/18 1337  For home use only DME Walker rolling  [employer])  Once    Comments:  Bariatric  Question:  Patient needs a walker to  treat with the following condition  Answer:  Physical deconditioning   04/27/18 1337   04/27/18 1037  For home use only DME Walker rolling  Once    Comments:  Bariatric weight 368 pounds  Question:  Patient needs a walker to treat with the following condition  Answer:  Weakness   04/27/18 1037     No Known Allergies Follow-up Information    Nadara Mustard, MD Follow up in 2 week(s).   Specialty:  Orthopedic Surgery Contact information: [address] [place] [address] [phone]        [facility].   Specialty:  Home Health [name] Why:  For home health [name] and rolling walker  Contact information: [address] [place] [address] [phone]        Wilmer Floor., MD. Schedule an appointment as soon as possible for a visit in 1 week(s).   Specialty:  Internal Medicine Why:  Hospital follow up Contact information: [address] [place] [address] [phone]            The results of significant diagnostics from this hospitalization (including imaging, microbiology, ancillary and laboratory) are listed below for reference.    Significant Diagnostic Studies: Mr Foot Right W Wo Contrast  Result Date: 04/22/2018 CLINICAL DATA:  Chronic heel ulcer with redness, swelling and drainage. EXAM: MRI OF THE RIGHT FOREFOOT WITHOUT AND WITH CONTRAST TECHNIQUE: Multiplanar, multisequence MR imaging of the right hindfoot was performed before and after the administration of intravenous contrast. CONTRAST:  20mL MULTIHANCE GADOBENATE DIMEGLUMINE 529 MG/ML IV SOLN COMPARISON:  Radiographs 04/19/2018 and 04/21/2018 FINDINGS: Examination is severely limited by patient motion. There is a large ulcer on the plantar aspect of the heel with underlying inflammation/edema and enhancement suggesting cellulitis. No discrete drainable soft tissue abscess is identified. There is diffuse myofasciitis but no  definite findings for pyomyositis. No obvious findings for septic arthritis or osteomyelitis. On the comparison radiographs from 07/16 and 04/21/2018 there is a fracture involving the middle phalanx of the second toe with displacement and collapse. This was not mentioned on the prior reports. IMPRESSION: 1. Very limited examination. 2. Large heel ulcer with cellulitis and myofasciitis but no discrete drainable soft tissue abscess, pyomyositis, septic arthritis or osteomyelitis. 3. Although not covered on this examination note is made of a fracture involving the middle phalanx of the second toe on recent x-rays. Electronically Signed   By: Rudie Meyer M.D.   On: 04/22/2018 12:36   Dg Chest Port 1 View  Result Date: 04/21/2018 CLINICAL DATA:  Shortness of breath EXAM: PORTABLE CHEST 1 VIEW COMPARISON:  01/04/2017 FINDINGS: The heart size and mediastinal contours are within normal limits. Both lungs are clear. The visualized skeletal structures are unremarkable. IMPRESSION: No active disease. Electronically Signed   By: Deatra Robinson M.D.   On: 04/21/2018 20:59   Dg Foot Complete Right  Result Date: 04/21/2018 CLINICAL DATA:  Wound infection EXAM: RIGHT FOOT COMPLETE - 3+ VIEW COMPARISON:  04/19/2018 FINDINGS: Osseous mineralization normal. Displaced intra-articular fracture at base of middle phalanx second toe unchanged. Joint spaces otherwise preserved. Plantar calcaneal spur. Large plantar ulcer at heel. No acute fracture, dislocation, or bone destruction. IMPRESSION: Large plantar calcaneal ulcer without acute bony abnormalities; if there is persistent clinical concern for osteomyelitis recommend MR assessment. Plantar calcaneal spurring. Displaced intra-articular fracture at base of middle phalanx RIGHT second toe. Electronically Signed   By: Ulyses Southward M.D.   On: 04/21/2018 14:57   [place] Ekg Site Rite  Result Date: 04/26/2018 If Site Rite image not attached, placement could not be confirmed due to  current cardiac rhythm.   Microbiology: Recent Results (from the past 240 hour(s))  Culture, blood (Routine X 2) w Reflex to ID Panel     Status: None   Collection Time: 04/21/18  5:48 PM  Result Value Ref Range Status   Specimen Description BLOOD RIGHT ANTECUBITAL  Final   Special Requests   Final    BOTTLES DRAWN AEROBIC AND ANAEROBIC Blood Culture adequate volume   Culture   Final    NO GROWTH 5 DAYS Performed at [facility], [address] [place], [place] [address]    Report Status 04/26/2018 FINAL  Final  Culture, blood (Routine X 2) w Reflex to ID Panel     Status: None   Collection Time: 04/21/18  5:53 PM  Result Value Ref Range Status   Specimen Description BLOOD RIGHT ANTECUBITAL  Final   Special Requests   Final    BOTTLES DRAWN AEROBIC AND ANAEROBIC Blood Culture adequate volume   Culture   Final    NO GROWTH 5 DAYS Performed at [facility], [address] [place], [place] [address]    Report Status 04/26/2018 FINAL  Final     Labs: Basic Metabolic Panel: Recent Labs  Lab 04/22/18 0331 04/23/18 0625 04/24/18 0355 04/26/18 0635 04/27/18 0545  NA 138 139 139 138 140  K 3.5 3.9 4.0 3.7 3.6  CL 103 106 102 102 103  CO2 25 26 28 27 29   GLUCOSE 173* 181* 203* 161* 141*  BUN 13 15 17 14 14   CREATININE 1.23* 1.01* 1.00 0.95 0.92  CALCIUM 8.3* 8.8* 9.2 8.7* 8.9   Liver Function Tests: No results for input(s): AST, ALT, ALKPHOS, BILITOT, PROT, ALBUMIN in the last 168 hours. No results for input(s): LIPASE, AMYLASE in the last 168 hours. No results for input(s): AMMONIA in the last 168 hours. CBC: Recent Labs  Lab 04/21/18 1706 04/22/18 0331 04/23/18 0625 04/24/18 0355 04/25/18 0555 04/26/18 0635 04/27/18 0545  WBC 14.2* 12.9* 14.0* 14.8* 13.2* 12.4* 10.5  NEUTROABS 11.3* 8.4*  --   --   --  8.3* 7.3  HGB 10.0* 9.0* 8.6* 9.0* 8.5* 8.6* 8.4*  HCT 34.4* 30.2* 28.6* 29.9* 28.0* 28.7* 27.9*  MCV 83.3 81.8 81.5 81.7 81.6 82.2 81.8  PLT 291  280 304 345 370 376 345   Cardiac Enzymes: No results for input(s): CKTOTAL, CKMB, CKMBINDEX, TROPONINI in the last 168 hours. BNP: BNP (last 3 results) No results for input(s): BNP in the  last 8760 hours.  ProBNP (last 3 results) No results for input(s): PROBNP in the last 8760 hours.  CBG: Recent Labs  Lab 04/26/18 1956 04/27/18 0022 04/27/18 0409 04/27/18 0754 04/27/18 1144  GLUCAP 270* 155* 163* 125* 273*       Signed:  Vielka Klinedinst  [facility] 04/27/2018, 1:40 PM

## 2018-04-27 NOTE — Plan of Care (Signed)

## 2018-04-28 ENCOUNTER — Telehealth (INDEPENDENT_AMBULATORY_CARE_PROVIDER_SITE_OTHER): Payer: Self-pay

## 2018-04-28 MED ORDER — COLLAGENASE 250 UNIT/GM EX OINT: TOPICAL_OINTMENT | CUTANEOUS | 3 refills | Status: DC

## 2018-04-28 NOTE — Telephone Encounter (Signed)
Instructions were for 3 times a week dressing changes to the heel ulcer with 6 weeks of IV antibiotics.  Patient will need the dressing changed 3 times a week with Santyl ointment.  Please call in a prescription for Santyl as well.

## 2018-04-28 NOTE — Telephone Encounter (Signed)
Marylene LandAngela, [facility] to [facility] stating they received orders for patient for daily wound care. They do not do daily wound care and the patient has no caregiver. She stated that if frequency was changed may be able to help patient. However, patient does not have rx for santyl nor was she given any when d/c from hospital. Please advise. Contact for nurse [phone]

## 2018-04-28 NOTE — Telephone Encounter (Signed)
Pt called back to discuss her pt care. I advised pt that Dr.Duda is currently in clinic.

## 2018-04-28 NOTE — Addendum Note (Signed)
Addended byPrescott Parma: Cesario Weidinger on: 04/28/2018 04:52 PM   Modules accepted: Orders

## 2018-04-28 NOTE — Telephone Encounter (Signed)
See other note

## 2018-04-28 NOTE — Telephone Encounter (Signed)
Mary[facility]Angela [facility] wanting to know status of previous request (see note sent to Dr Lajoyce Cornersuda) I advised her was awaiting response from him as he is seeing patients right now. She stated she was calling back because it has been 30 min and had not heard anything. I advised again that he was seeing patients and we were waiting to be advised by Dr Lajoyce Cornersuda. She did provide me with homehealth agency that does do daily wound care changes and that is [facility] and if Dr Lajoyce Cornersuda decides to send referral to them will fax to [phone]

## 2018-04-28 NOTE — Consult Note (Signed)
            [facility] CM Primary Care Navigator  04/28/2018  [name] Jeffersonmy C Conradt 05/12/1961 [id number]   Attempt toseepatient at the bedsideto identify possible discharge needs butshe was alreadydischargedper staff.  Patient was discharged home with home health services.  Per chart review,patient presented for evaluation of fever, malaise, and right foot ulcer with surrounding erythema, swelling, and drainage. (sepsis secondary to diabetic right foot ulcer)  Primary care provider's officeis listed asprovidingtransition of care (TOC).  Patient hasdischarge instruction to follow-up withprimary care providerin 1 week.  Primary care provider's office called (Tammy) to notify of patient's discharge; need for post hospital follow-up and transition of care.  Notified ofpatient'shealth issues needing close follow-up. Was informed that patient has scheduled follow-up visit with primary care provider on 05/05/18 at 3 pm.   Made aware to refer [facility] to [facility] care management if deemed necessaryandappropriate for services.   For additional questions please contact:  Karin GoldenLorraine A. Tim Corriher, BSN, RN-BC [facility] PRIMARY CARE Navigator Cell: [phone]

## 2018-04-28 NOTE — Telephone Encounter (Signed)
Still waiting to be advised by Dr Lajoyce Cornersuda

## 2018-04-28 NOTE — Telephone Encounter (Signed)
Rx submitted for Santyl I called Marylene Landngela back who is [facility] and advised per Dr Lajoyce Cornersuda orders were note for daily but 3x/wk.I also advised her that I had sent in rx for patient to her pharmacy.  In regards to IV abx, patient does not have PICC line so per Dr Lajoyce Cornersuda, ok not to do IV abx. Marylene Landngela will call back with any further questions. Since the patient does not need daily dressing changes, they wills till care [facility]

## 2018-04-29 ENCOUNTER — Telehealth (INDEPENDENT_AMBULATORY_CARE_PROVIDER_SITE_OTHER): Payer: Self-pay | Admitting: Orthopedic Surgery

## 2018-04-29 DIAGNOSIS — L03115 Cellulitis of right lower limb: Secondary | ICD-10-CM | POA: Diagnosis not present

## 2018-04-29 NOTE — Telephone Encounter (Signed)
Clydie [facility]Karen [facility] states that patient will be unable to do any dressing changes or wound care on her own.  She has no one that will be able to help her. Clydie BraunKaren feels that they will only be able to give her about 3 weeks and do not feel that they may be the best option to help the patient get well.  If you want the wound checked and dressed daily, they are unable to do that.  She has spoken with [facility] and they will be able to take the patient if you would like to try that.  Please advise.

## 2018-04-29 NOTE — Telephone Encounter (Signed)
See if [facility] can do santyl dressing changes 3 x a week, patient need to be off her foot

## 2018-04-29 NOTE — Telephone Encounter (Signed)
Clydie [facility]Karen from [facility] left a message stating that they need the start of care order for the patient and if she needs IV Antibiotics they will not be able to provide this care for her and that she needs to be referred to a facility like [facility].  If you have any questions, Colin BroachKaren's number is [phone].  Thank you.

## 2018-05-02 ENCOUNTER — Telehealth (INDEPENDENT_AMBULATORY_CARE_PROVIDER_SITE_OTHER): Payer: Self-pay | Admitting: Orthopedic Surgery

## 2018-05-02 ENCOUNTER — Telehealth (INDEPENDENT_AMBULATORY_CARE_PROVIDER_SITE_OTHER): Payer: Self-pay

## 2018-05-02 ENCOUNTER — Telehealth (INDEPENDENT_AMBULATORY_CARE_PROVIDER_SITE_OTHER): Payer: Self-pay | Admitting: Radiology

## 2018-05-02 DIAGNOSIS — Z79891 Long term (current) use of opiate analgesic: Secondary | ICD-10-CM | POA: Diagnosis not present

## 2018-05-02 DIAGNOSIS — I10 Essential (primary) hypertension: Secondary | ICD-10-CM | POA: Diagnosis not present

## 2018-05-02 DIAGNOSIS — E1142 Type 2 diabetes mellitus with diabetic polyneuropathy: Secondary | ICD-10-CM | POA: Diagnosis not present

## 2018-05-02 DIAGNOSIS — E612 Magnesium deficiency: Secondary | ICD-10-CM | POA: Diagnosis not present

## 2018-05-02 DIAGNOSIS — E11621 Type 2 diabetes mellitus with foot ulcer: Secondary | ICD-10-CM | POA: Diagnosis not present

## 2018-05-02 DIAGNOSIS — Z9181 History of falling: Secondary | ICD-10-CM | POA: Diagnosis not present

## 2018-05-02 DIAGNOSIS — J449 Chronic obstructive pulmonary disease, unspecified: Secondary | ICD-10-CM | POA: Diagnosis not present

## 2018-05-02 DIAGNOSIS — E559 Vitamin D deficiency, unspecified: Secondary | ICD-10-CM | POA: Diagnosis not present

## 2018-05-02 DIAGNOSIS — L97415 Non-pressure chronic ulcer of right heel and midfoot with muscle involvement without evidence of necrosis: Secondary | ICD-10-CM | POA: Diagnosis not present

## 2018-05-02 DIAGNOSIS — Z794 Long term (current) use of insulin: Secondary | ICD-10-CM | POA: Diagnosis not present

## 2018-05-02 DIAGNOSIS — S92521D Displaced fracture of medial phalanx of right lesser toe(s), subsequent encounter for fracture with routine healing: Secondary | ICD-10-CM | POA: Diagnosis not present

## 2018-05-02 DIAGNOSIS — D649 Anemia, unspecified: Secondary | ICD-10-CM | POA: Diagnosis not present

## 2018-05-02 DIAGNOSIS — E1165 Type 2 diabetes mellitus with hyperglycemia: Secondary | ICD-10-CM | POA: Diagnosis not present

## 2018-05-02 NOTE — Telephone Encounter (Signed)
Charity with [facility] would like verbal orders to apply aquacel to the wound and clarification for Santyl ointment.  Cb# is [phone].  Fax# is [phone].  Please advise.  Thank You.

## 2018-05-02 NOTE — Telephone Encounter (Signed)
Duplicate message this has been done [facility] will do santyl dressing changes three times a week. Will continue with non weight bearing and PRAFO will update orders after visit on Wednesday.

## 2018-05-02 NOTE — Telephone Encounter (Signed)
Ebony from [facility] left a message stating that they needed to know how many wounds the patient has and what the size of the wounds are in centimeters.  They also need to know the day supply of the Santyl.  CB#[phone].  Thank you.

## 2018-05-02 NOTE — Telephone Encounter (Signed)
I just saw this in Betsy's box, I sent you a message as well today. So sorry to duplicate.

## 2018-05-02 NOTE — Telephone Encounter (Signed)
Triage VM: Charity called, needs to clarify orders for wound care.  D/c papers said apply santyl daily, and Dr Lajoyce Cornersuda ordered dressing change 3x week.  Needs to clarify that.   Also asking for orders to apply aquacel to mascerated areas around the wound.   Please call her back to advise.

## 2018-05-02 NOTE — Telephone Encounter (Signed)
Patient called asked when will [facility] start coming to her home for wound care? The number to contact patient is [phone]

## 2018-05-02 NOTE — Telephone Encounter (Signed)
done

## 2018-05-02 NOTE — Telephone Encounter (Signed)
This has been done see previous message.  

## 2018-05-02 NOTE — Telephone Encounter (Signed)
Pt has picked up medication.  

## 2018-05-02 NOTE — Telephone Encounter (Signed)
I called and advised the santyl dressing change three times a week per d/c orders. Pt will come in on Wednesday for eval and update orders to continue with PRAFO elevation non weightbearing and will eval this week.

## 2018-05-03 DIAGNOSIS — I10 Essential (primary) hypertension: Secondary | ICD-10-CM | POA: Diagnosis not present

## 2018-05-03 DIAGNOSIS — Z9181 History of falling: Secondary | ICD-10-CM | POA: Diagnosis not present

## 2018-05-03 DIAGNOSIS — L97415 Non-pressure chronic ulcer of right heel and midfoot with muscle involvement without evidence of necrosis: Secondary | ICD-10-CM | POA: Diagnosis not present

## 2018-05-03 DIAGNOSIS — E1142 Type 2 diabetes mellitus with diabetic polyneuropathy: Secondary | ICD-10-CM | POA: Diagnosis not present

## 2018-05-03 DIAGNOSIS — E612 Magnesium deficiency: Secondary | ICD-10-CM | POA: Diagnosis not present

## 2018-05-03 DIAGNOSIS — E1165 Type 2 diabetes mellitus with hyperglycemia: Secondary | ICD-10-CM | POA: Diagnosis not present

## 2018-05-03 DIAGNOSIS — E11621 Type 2 diabetes mellitus with foot ulcer: Secondary | ICD-10-CM | POA: Diagnosis not present

## 2018-05-03 DIAGNOSIS — Z79891 Long term (current) use of opiate analgesic: Secondary | ICD-10-CM | POA: Diagnosis not present

## 2018-05-03 DIAGNOSIS — S92521D Displaced fracture of medial phalanx of right lesser toe(s), subsequent encounter for fracture with routine healing: Secondary | ICD-10-CM | POA: Diagnosis not present

## 2018-05-03 DIAGNOSIS — Z794 Long term (current) use of insulin: Secondary | ICD-10-CM | POA: Diagnosis not present

## 2018-05-03 DIAGNOSIS — D649 Anemia, unspecified: Secondary | ICD-10-CM | POA: Diagnosis not present

## 2018-05-03 DIAGNOSIS — E559 Vitamin D deficiency, unspecified: Secondary | ICD-10-CM | POA: Diagnosis not present

## 2018-05-03 DIAGNOSIS — J449 Chronic obstructive pulmonary disease, unspecified: Secondary | ICD-10-CM | POA: Diagnosis not present

## 2018-05-04 ENCOUNTER — Telehealth (INDEPENDENT_AMBULATORY_CARE_PROVIDER_SITE_OTHER): Payer: Self-pay | Admitting: Orthopedic Surgery

## 2018-05-04 ENCOUNTER — Ambulatory Visit (INDEPENDENT_AMBULATORY_CARE_PROVIDER_SITE_OTHER): Payer: Medicare Other | Admitting: Family

## 2018-05-04 NOTE — Telephone Encounter (Signed)
Pt has an appt today and can obtain measurements at that time. We have not seen the pt in the office before this will be her first visit. Also pt advised that she already has picked up the medication? Will hold and call once the pt has been seen.

## 2018-05-04 NOTE — Telephone Encounter (Signed)
Pt cancelled appt for today and did not reschedule. I tried to call pt to make appt for this week and line is busy. Will try again.

## 2018-05-04 NOTE — Telephone Encounter (Signed)
[facility] needs patient measurements for the wound in order to fil Rx

## 2018-05-05 DIAGNOSIS — Z79899 Other long term (current) drug therapy: Secondary | ICD-10-CM | POA: Diagnosis not present

## 2018-05-05 DIAGNOSIS — R300 Dysuria: Secondary | ICD-10-CM | POA: Diagnosis not present

## 2018-05-05 DIAGNOSIS — L03115 Cellulitis of right lower limb: Secondary | ICD-10-CM | POA: Diagnosis not present

## 2018-05-05 DIAGNOSIS — G4734 Idiopathic sleep related nonobstructive alveolar hypoventilation: Secondary | ICD-10-CM | POA: Diagnosis not present

## 2018-05-05 NOTE — Telephone Encounter (Signed)
Called and lm on vm [facility] [phone] advised that we had spoken a few days ago and pt was to come in the office yesterday for follow up from the ER for DM right foot ulcer. Pt cx appt and did not sch follow up and I have tried since yesterday to get ahold of her and the line remains busy. I advised then when she sees the pt this week to have her call the office for an appt so that Dr. Lajoyce Cornersuda can re-eval and provide updated wound care orders for the pt. To call with any questions.

## 2018-05-06 DIAGNOSIS — Z79891 Long term (current) use of opiate analgesic: Secondary | ICD-10-CM | POA: Diagnosis not present

## 2018-05-06 DIAGNOSIS — E612 Magnesium deficiency: Secondary | ICD-10-CM | POA: Diagnosis not present

## 2018-05-06 DIAGNOSIS — E1142 Type 2 diabetes mellitus with diabetic polyneuropathy: Secondary | ICD-10-CM | POA: Diagnosis not present

## 2018-05-06 DIAGNOSIS — J449 Chronic obstructive pulmonary disease, unspecified: Secondary | ICD-10-CM | POA: Diagnosis not present

## 2018-05-06 DIAGNOSIS — S92521D Displaced fracture of medial phalanx of right lesser toe(s), subsequent encounter for fracture with routine healing: Secondary | ICD-10-CM | POA: Diagnosis not present

## 2018-05-06 DIAGNOSIS — D649 Anemia, unspecified: Secondary | ICD-10-CM | POA: Diagnosis not present

## 2018-05-06 DIAGNOSIS — E11621 Type 2 diabetes mellitus with foot ulcer: Secondary | ICD-10-CM | POA: Diagnosis not present

## 2018-05-06 DIAGNOSIS — L97415 Non-pressure chronic ulcer of right heel and midfoot with muscle involvement without evidence of necrosis: Secondary | ICD-10-CM | POA: Diagnosis not present

## 2018-05-06 DIAGNOSIS — E1165 Type 2 diabetes mellitus with hyperglycemia: Secondary | ICD-10-CM | POA: Diagnosis not present

## 2018-05-06 DIAGNOSIS — Z794 Long term (current) use of insulin: Secondary | ICD-10-CM | POA: Diagnosis not present

## 2018-05-06 DIAGNOSIS — I10 Essential (primary) hypertension: Secondary | ICD-10-CM | POA: Diagnosis not present

## 2018-05-06 DIAGNOSIS — E559 Vitamin D deficiency, unspecified: Secondary | ICD-10-CM | POA: Diagnosis not present

## 2018-05-06 DIAGNOSIS — Z9181 History of falling: Secondary | ICD-10-CM | POA: Diagnosis not present

## 2018-05-09 DIAGNOSIS — J449 Chronic obstructive pulmonary disease, unspecified: Secondary | ICD-10-CM | POA: Diagnosis not present

## 2018-05-09 DIAGNOSIS — S92521D Displaced fracture of medial phalanx of right lesser toe(s), subsequent encounter for fracture with routine healing: Secondary | ICD-10-CM | POA: Diagnosis not present

## 2018-05-09 DIAGNOSIS — E11621 Type 2 diabetes mellitus with foot ulcer: Secondary | ICD-10-CM | POA: Diagnosis not present

## 2018-05-09 DIAGNOSIS — Z794 Long term (current) use of insulin: Secondary | ICD-10-CM | POA: Diagnosis not present

## 2018-05-09 DIAGNOSIS — E559 Vitamin D deficiency, unspecified: Secondary | ICD-10-CM | POA: Diagnosis not present

## 2018-05-09 DIAGNOSIS — E612 Magnesium deficiency: Secondary | ICD-10-CM | POA: Diagnosis not present

## 2018-05-09 DIAGNOSIS — E1165 Type 2 diabetes mellitus with hyperglycemia: Secondary | ICD-10-CM | POA: Diagnosis not present

## 2018-05-09 DIAGNOSIS — I10 Essential (primary) hypertension: Secondary | ICD-10-CM | POA: Diagnosis not present

## 2018-05-09 DIAGNOSIS — Z9181 History of falling: Secondary | ICD-10-CM | POA: Diagnosis not present

## 2018-05-09 DIAGNOSIS — D649 Anemia, unspecified: Secondary | ICD-10-CM | POA: Diagnosis not present

## 2018-05-09 DIAGNOSIS — Z79891 Long term (current) use of opiate analgesic: Secondary | ICD-10-CM | POA: Diagnosis not present

## 2018-05-09 DIAGNOSIS — L97415 Non-pressure chronic ulcer of right heel and midfoot with muscle involvement without evidence of necrosis: Secondary | ICD-10-CM | POA: Diagnosis not present

## 2018-05-09 DIAGNOSIS — E1142 Type 2 diabetes mellitus with diabetic polyneuropathy: Secondary | ICD-10-CM | POA: Diagnosis not present

## 2018-05-10 DIAGNOSIS — E1142 Type 2 diabetes mellitus with diabetic polyneuropathy: Secondary | ICD-10-CM | POA: Diagnosis not present

## 2018-05-10 DIAGNOSIS — S92521D Displaced fracture of medial phalanx of right lesser toe(s), subsequent encounter for fracture with routine healing: Secondary | ICD-10-CM | POA: Diagnosis not present

## 2018-05-10 DIAGNOSIS — E1165 Type 2 diabetes mellitus with hyperglycemia: Secondary | ICD-10-CM | POA: Diagnosis not present

## 2018-05-10 DIAGNOSIS — Z87891 Personal history of nicotine dependence: Secondary | ICD-10-CM | POA: Diagnosis not present

## 2018-05-10 DIAGNOSIS — J449 Chronic obstructive pulmonary disease, unspecified: Secondary | ICD-10-CM | POA: Diagnosis not present

## 2018-05-10 DIAGNOSIS — I10 Essential (primary) hypertension: Secondary | ICD-10-CM | POA: Diagnosis not present

## 2018-05-10 DIAGNOSIS — E11621 Type 2 diabetes mellitus with foot ulcer: Secondary | ICD-10-CM | POA: Diagnosis not present

## 2018-05-10 DIAGNOSIS — L97512 Non-pressure chronic ulcer of other part of right foot with fat layer exposed: Secondary | ICD-10-CM | POA: Diagnosis not present

## 2018-05-10 DIAGNOSIS — Z79891 Long term (current) use of opiate analgesic: Secondary | ICD-10-CM | POA: Diagnosis not present

## 2018-05-10 DIAGNOSIS — L97412 Non-pressure chronic ulcer of right heel and midfoot with fat layer exposed: Secondary | ICD-10-CM | POA: Diagnosis not present

## 2018-05-10 DIAGNOSIS — E612 Magnesium deficiency: Secondary | ICD-10-CM | POA: Diagnosis not present

## 2018-05-10 DIAGNOSIS — L97415 Non-pressure chronic ulcer of right heel and midfoot with muscle involvement without evidence of necrosis: Secondary | ICD-10-CM | POA: Diagnosis not present

## 2018-05-10 DIAGNOSIS — Z9181 History of falling: Secondary | ICD-10-CM | POA: Diagnosis not present

## 2018-05-10 DIAGNOSIS — D649 Anemia, unspecified: Secondary | ICD-10-CM | POA: Diagnosis not present

## 2018-05-10 DIAGNOSIS — E559 Vitamin D deficiency, unspecified: Secondary | ICD-10-CM | POA: Diagnosis not present

## 2018-05-10 DIAGNOSIS — Z794 Long term (current) use of insulin: Secondary | ICD-10-CM | POA: Diagnosis not present

## 2018-05-13 DIAGNOSIS — D649 Anemia, unspecified: Secondary | ICD-10-CM | POA: Diagnosis not present

## 2018-05-13 DIAGNOSIS — Z794 Long term (current) use of insulin: Secondary | ICD-10-CM | POA: Diagnosis not present

## 2018-05-13 DIAGNOSIS — E1165 Type 2 diabetes mellitus with hyperglycemia: Secondary | ICD-10-CM | POA: Diagnosis not present

## 2018-05-13 DIAGNOSIS — E1142 Type 2 diabetes mellitus with diabetic polyneuropathy: Secondary | ICD-10-CM | POA: Diagnosis not present

## 2018-05-13 DIAGNOSIS — E559 Vitamin D deficiency, unspecified: Secondary | ICD-10-CM | POA: Diagnosis not present

## 2018-05-13 DIAGNOSIS — S92521D Displaced fracture of medial phalanx of right lesser toe(s), subsequent encounter for fracture with routine healing: Secondary | ICD-10-CM | POA: Diagnosis not present

## 2018-05-13 DIAGNOSIS — Z9181 History of falling: Secondary | ICD-10-CM | POA: Diagnosis not present

## 2018-05-13 DIAGNOSIS — E11621 Type 2 diabetes mellitus with foot ulcer: Secondary | ICD-10-CM | POA: Diagnosis not present

## 2018-05-13 DIAGNOSIS — Z79891 Long term (current) use of opiate analgesic: Secondary | ICD-10-CM | POA: Diagnosis not present

## 2018-05-13 DIAGNOSIS — L97415 Non-pressure chronic ulcer of right heel and midfoot with muscle involvement without evidence of necrosis: Secondary | ICD-10-CM | POA: Diagnosis not present

## 2018-05-13 DIAGNOSIS — I10 Essential (primary) hypertension: Secondary | ICD-10-CM | POA: Diagnosis not present

## 2018-05-13 DIAGNOSIS — E612 Magnesium deficiency: Secondary | ICD-10-CM | POA: Diagnosis not present

## 2018-05-13 DIAGNOSIS — J449 Chronic obstructive pulmonary disease, unspecified: Secondary | ICD-10-CM | POA: Diagnosis not present

## 2018-05-16 DIAGNOSIS — E612 Magnesium deficiency: Secondary | ICD-10-CM | POA: Diagnosis not present

## 2018-05-16 DIAGNOSIS — E1142 Type 2 diabetes mellitus with diabetic polyneuropathy: Secondary | ICD-10-CM | POA: Diagnosis not present

## 2018-05-16 DIAGNOSIS — E11621 Type 2 diabetes mellitus with foot ulcer: Secondary | ICD-10-CM | POA: Diagnosis not present

## 2018-05-16 DIAGNOSIS — E1165 Type 2 diabetes mellitus with hyperglycemia: Secondary | ICD-10-CM | POA: Diagnosis not present

## 2018-05-16 DIAGNOSIS — Z9181 History of falling: Secondary | ICD-10-CM | POA: Diagnosis not present

## 2018-05-16 DIAGNOSIS — J449 Chronic obstructive pulmonary disease, unspecified: Secondary | ICD-10-CM | POA: Diagnosis not present

## 2018-05-16 DIAGNOSIS — Z794 Long term (current) use of insulin: Secondary | ICD-10-CM | POA: Diagnosis not present

## 2018-05-16 DIAGNOSIS — I10 Essential (primary) hypertension: Secondary | ICD-10-CM | POA: Diagnosis not present

## 2018-05-16 DIAGNOSIS — Z79891 Long term (current) use of opiate analgesic: Secondary | ICD-10-CM | POA: Diagnosis not present

## 2018-05-16 DIAGNOSIS — E559 Vitamin D deficiency, unspecified: Secondary | ICD-10-CM | POA: Diagnosis not present

## 2018-05-16 DIAGNOSIS — S92521D Displaced fracture of medial phalanx of right lesser toe(s), subsequent encounter for fracture with routine healing: Secondary | ICD-10-CM | POA: Diagnosis not present

## 2018-05-16 DIAGNOSIS — L97415 Non-pressure chronic ulcer of right heel and midfoot with muscle involvement without evidence of necrosis: Secondary | ICD-10-CM | POA: Diagnosis not present

## 2018-05-16 DIAGNOSIS — D649 Anemia, unspecified: Secondary | ICD-10-CM | POA: Diagnosis not present

## 2018-05-18 DIAGNOSIS — L97512 Non-pressure chronic ulcer of other part of right foot with fat layer exposed: Secondary | ICD-10-CM | POA: Diagnosis not present

## 2018-05-18 DIAGNOSIS — L97412 Non-pressure chronic ulcer of right heel and midfoot with fat layer exposed: Secondary | ICD-10-CM | POA: Diagnosis not present

## 2018-05-18 DIAGNOSIS — Z87891 Personal history of nicotine dependence: Secondary | ICD-10-CM | POA: Diagnosis not present

## 2018-05-18 DIAGNOSIS — E11621 Type 2 diabetes mellitus with foot ulcer: Secondary | ICD-10-CM | POA: Diagnosis not present

## 2018-05-19 DIAGNOSIS — L97415 Non-pressure chronic ulcer of right heel and midfoot with muscle involvement without evidence of necrosis: Secondary | ICD-10-CM | POA: Diagnosis not present

## 2018-05-19 DIAGNOSIS — E1165 Type 2 diabetes mellitus with hyperglycemia: Secondary | ICD-10-CM | POA: Diagnosis not present

## 2018-05-19 DIAGNOSIS — Z79891 Long term (current) use of opiate analgesic: Secondary | ICD-10-CM | POA: Diagnosis not present

## 2018-05-19 DIAGNOSIS — E612 Magnesium deficiency: Secondary | ICD-10-CM | POA: Diagnosis not present

## 2018-05-19 DIAGNOSIS — Z794 Long term (current) use of insulin: Secondary | ICD-10-CM | POA: Diagnosis not present

## 2018-05-19 DIAGNOSIS — I10 Essential (primary) hypertension: Secondary | ICD-10-CM | POA: Diagnosis not present

## 2018-05-19 DIAGNOSIS — D649 Anemia, unspecified: Secondary | ICD-10-CM | POA: Diagnosis not present

## 2018-05-19 DIAGNOSIS — J449 Chronic obstructive pulmonary disease, unspecified: Secondary | ICD-10-CM | POA: Diagnosis not present

## 2018-05-19 DIAGNOSIS — E559 Vitamin D deficiency, unspecified: Secondary | ICD-10-CM | POA: Diagnosis not present

## 2018-05-19 DIAGNOSIS — Z9181 History of falling: Secondary | ICD-10-CM | POA: Diagnosis not present

## 2018-05-19 DIAGNOSIS — E1142 Type 2 diabetes mellitus with diabetic polyneuropathy: Secondary | ICD-10-CM | POA: Diagnosis not present

## 2018-05-19 DIAGNOSIS — S92521D Displaced fracture of medial phalanx of right lesser toe(s), subsequent encounter for fracture with routine healing: Secondary | ICD-10-CM | POA: Diagnosis not present

## 2018-05-19 DIAGNOSIS — E11621 Type 2 diabetes mellitus with foot ulcer: Secondary | ICD-10-CM | POA: Diagnosis not present

## 2018-05-20 DIAGNOSIS — Z794 Long term (current) use of insulin: Secondary | ICD-10-CM | POA: Diagnosis not present

## 2018-05-20 DIAGNOSIS — Z79891 Long term (current) use of opiate analgesic: Secondary | ICD-10-CM | POA: Diagnosis not present

## 2018-05-20 DIAGNOSIS — L97415 Non-pressure chronic ulcer of right heel and midfoot with muscle involvement without evidence of necrosis: Secondary | ICD-10-CM | POA: Diagnosis not present

## 2018-05-20 DIAGNOSIS — E559 Vitamin D deficiency, unspecified: Secondary | ICD-10-CM | POA: Diagnosis not present

## 2018-05-20 DIAGNOSIS — I10 Essential (primary) hypertension: Secondary | ICD-10-CM | POA: Diagnosis not present

## 2018-05-20 DIAGNOSIS — E1165 Type 2 diabetes mellitus with hyperglycemia: Secondary | ICD-10-CM | POA: Diagnosis not present

## 2018-05-20 DIAGNOSIS — J449 Chronic obstructive pulmonary disease, unspecified: Secondary | ICD-10-CM | POA: Diagnosis not present

## 2018-05-20 DIAGNOSIS — S92521D Displaced fracture of medial phalanx of right lesser toe(s), subsequent encounter for fracture with routine healing: Secondary | ICD-10-CM | POA: Diagnosis not present

## 2018-05-20 DIAGNOSIS — E1142 Type 2 diabetes mellitus with diabetic polyneuropathy: Secondary | ICD-10-CM | POA: Diagnosis not present

## 2018-05-20 DIAGNOSIS — Z9181 History of falling: Secondary | ICD-10-CM | POA: Diagnosis not present

## 2018-05-20 DIAGNOSIS — E612 Magnesium deficiency: Secondary | ICD-10-CM | POA: Diagnosis not present

## 2018-05-20 DIAGNOSIS — D649 Anemia, unspecified: Secondary | ICD-10-CM | POA: Diagnosis not present

## 2018-05-20 DIAGNOSIS — E11621 Type 2 diabetes mellitus with foot ulcer: Secondary | ICD-10-CM | POA: Diagnosis not present

## 2018-05-23 DIAGNOSIS — E11621 Type 2 diabetes mellitus with foot ulcer: Secondary | ICD-10-CM | POA: Diagnosis not present

## 2018-05-23 DIAGNOSIS — E559 Vitamin D deficiency, unspecified: Secondary | ICD-10-CM | POA: Diagnosis not present

## 2018-05-23 DIAGNOSIS — E612 Magnesium deficiency: Secondary | ICD-10-CM | POA: Diagnosis not present

## 2018-05-23 DIAGNOSIS — E1165 Type 2 diabetes mellitus with hyperglycemia: Secondary | ICD-10-CM | POA: Diagnosis not present

## 2018-05-23 DIAGNOSIS — Z9181 History of falling: Secondary | ICD-10-CM | POA: Diagnosis not present

## 2018-05-23 DIAGNOSIS — S92521D Displaced fracture of medial phalanx of right lesser toe(s), subsequent encounter for fracture with routine healing: Secondary | ICD-10-CM | POA: Diagnosis not present

## 2018-05-23 DIAGNOSIS — L97415 Non-pressure chronic ulcer of right heel and midfoot with muscle involvement without evidence of necrosis: Secondary | ICD-10-CM | POA: Diagnosis not present

## 2018-05-23 DIAGNOSIS — J449 Chronic obstructive pulmonary disease, unspecified: Secondary | ICD-10-CM | POA: Diagnosis not present

## 2018-05-23 DIAGNOSIS — Z79891 Long term (current) use of opiate analgesic: Secondary | ICD-10-CM | POA: Diagnosis not present

## 2018-05-23 DIAGNOSIS — I10 Essential (primary) hypertension: Secondary | ICD-10-CM | POA: Diagnosis not present

## 2018-05-23 DIAGNOSIS — E1142 Type 2 diabetes mellitus with diabetic polyneuropathy: Secondary | ICD-10-CM | POA: Diagnosis not present

## 2018-05-23 DIAGNOSIS — Z794 Long term (current) use of insulin: Secondary | ICD-10-CM | POA: Diagnosis not present

## 2018-05-23 DIAGNOSIS — D649 Anemia, unspecified: Secondary | ICD-10-CM | POA: Diagnosis not present

## 2018-05-25 DIAGNOSIS — E11621 Type 2 diabetes mellitus with foot ulcer: Secondary | ICD-10-CM | POA: Diagnosis not present

## 2018-05-25 DIAGNOSIS — Z87891 Personal history of nicotine dependence: Secondary | ICD-10-CM | POA: Diagnosis not present

## 2018-05-25 DIAGNOSIS — L97412 Non-pressure chronic ulcer of right heel and midfoot with fat layer exposed: Secondary | ICD-10-CM | POA: Diagnosis not present

## 2018-05-25 DIAGNOSIS — L97522 Non-pressure chronic ulcer of other part of left foot with fat layer exposed: Secondary | ICD-10-CM | POA: Diagnosis not present

## 2018-05-25 DIAGNOSIS — L97512 Non-pressure chronic ulcer of other part of right foot with fat layer exposed: Secondary | ICD-10-CM | POA: Diagnosis not present

## 2018-05-27 DIAGNOSIS — J449 Chronic obstructive pulmonary disease, unspecified: Secondary | ICD-10-CM | POA: Diagnosis not present

## 2018-05-27 DIAGNOSIS — E559 Vitamin D deficiency, unspecified: Secondary | ICD-10-CM | POA: Diagnosis not present

## 2018-05-27 DIAGNOSIS — E1165 Type 2 diabetes mellitus with hyperglycemia: Secondary | ICD-10-CM | POA: Diagnosis not present

## 2018-05-27 DIAGNOSIS — Z79891 Long term (current) use of opiate analgesic: Secondary | ICD-10-CM | POA: Diagnosis not present

## 2018-05-27 DIAGNOSIS — E1142 Type 2 diabetes mellitus with diabetic polyneuropathy: Secondary | ICD-10-CM | POA: Diagnosis not present

## 2018-05-27 DIAGNOSIS — I10 Essential (primary) hypertension: Secondary | ICD-10-CM | POA: Diagnosis not present

## 2018-05-27 DIAGNOSIS — Z9181 History of falling: Secondary | ICD-10-CM | POA: Diagnosis not present

## 2018-05-27 DIAGNOSIS — L97415 Non-pressure chronic ulcer of right heel and midfoot with muscle involvement without evidence of necrosis: Secondary | ICD-10-CM | POA: Diagnosis not present

## 2018-05-27 DIAGNOSIS — S92521D Displaced fracture of medial phalanx of right lesser toe(s), subsequent encounter for fracture with routine healing: Secondary | ICD-10-CM | POA: Diagnosis not present

## 2018-05-27 DIAGNOSIS — E11621 Type 2 diabetes mellitus with foot ulcer: Secondary | ICD-10-CM | POA: Diagnosis not present

## 2018-05-27 DIAGNOSIS — Z794 Long term (current) use of insulin: Secondary | ICD-10-CM | POA: Diagnosis not present

## 2018-05-27 DIAGNOSIS — D649 Anemia, unspecified: Secondary | ICD-10-CM | POA: Diagnosis not present

## 2018-05-27 DIAGNOSIS — E612 Magnesium deficiency: Secondary | ICD-10-CM | POA: Diagnosis not present

## 2018-05-28 DIAGNOSIS — L0390 Cellulitis, unspecified: Secondary | ICD-10-CM | POA: Diagnosis not present

## 2018-05-30 DIAGNOSIS — E11621 Type 2 diabetes mellitus with foot ulcer: Secondary | ICD-10-CM | POA: Diagnosis not present

## 2018-05-30 DIAGNOSIS — Z79891 Long term (current) use of opiate analgesic: Secondary | ICD-10-CM | POA: Diagnosis not present

## 2018-05-30 DIAGNOSIS — E1165 Type 2 diabetes mellitus with hyperglycemia: Secondary | ICD-10-CM | POA: Diagnosis not present

## 2018-05-30 DIAGNOSIS — E612 Magnesium deficiency: Secondary | ICD-10-CM | POA: Diagnosis not present

## 2018-05-30 DIAGNOSIS — I10 Essential (primary) hypertension: Secondary | ICD-10-CM | POA: Diagnosis not present

## 2018-05-30 DIAGNOSIS — E1142 Type 2 diabetes mellitus with diabetic polyneuropathy: Secondary | ICD-10-CM | POA: Diagnosis not present

## 2018-05-30 DIAGNOSIS — S92521D Displaced fracture of medial phalanx of right lesser toe(s), subsequent encounter for fracture with routine healing: Secondary | ICD-10-CM | POA: Diagnosis not present

## 2018-05-30 DIAGNOSIS — D649 Anemia, unspecified: Secondary | ICD-10-CM | POA: Diagnosis not present

## 2018-05-30 DIAGNOSIS — L97415 Non-pressure chronic ulcer of right heel and midfoot with muscle involvement without evidence of necrosis: Secondary | ICD-10-CM | POA: Diagnosis not present

## 2018-05-30 DIAGNOSIS — J449 Chronic obstructive pulmonary disease, unspecified: Secondary | ICD-10-CM | POA: Diagnosis not present

## 2018-05-30 DIAGNOSIS — E559 Vitamin D deficiency, unspecified: Secondary | ICD-10-CM | POA: Diagnosis not present

## 2018-05-30 DIAGNOSIS — Z794 Long term (current) use of insulin: Secondary | ICD-10-CM | POA: Diagnosis not present

## 2018-05-30 DIAGNOSIS — Z9181 History of falling: Secondary | ICD-10-CM | POA: Diagnosis not present

## 2018-06-01 DIAGNOSIS — Z87891 Personal history of nicotine dependence: Secondary | ICD-10-CM | POA: Diagnosis not present

## 2018-06-01 DIAGNOSIS — L97512 Non-pressure chronic ulcer of other part of right foot with fat layer exposed: Secondary | ICD-10-CM | POA: Diagnosis not present

## 2018-06-01 DIAGNOSIS — E11621 Type 2 diabetes mellitus with foot ulcer: Secondary | ICD-10-CM | POA: Diagnosis not present

## 2018-06-01 DIAGNOSIS — L97412 Non-pressure chronic ulcer of right heel and midfoot with fat layer exposed: Secondary | ICD-10-CM | POA: Diagnosis not present

## 2018-06-01 DIAGNOSIS — L97522 Non-pressure chronic ulcer of other part of left foot with fat layer exposed: Secondary | ICD-10-CM | POA: Diagnosis not present

## 2018-06-02 DIAGNOSIS — J449 Chronic obstructive pulmonary disease, unspecified: Secondary | ICD-10-CM | POA: Diagnosis not present

## 2018-06-03 DIAGNOSIS — E1165 Type 2 diabetes mellitus with hyperglycemia: Secondary | ICD-10-CM | POA: Diagnosis not present

## 2018-06-03 DIAGNOSIS — Z794 Long term (current) use of insulin: Secondary | ICD-10-CM | POA: Diagnosis not present

## 2018-06-03 DIAGNOSIS — D649 Anemia, unspecified: Secondary | ICD-10-CM | POA: Diagnosis not present

## 2018-06-03 DIAGNOSIS — E11621 Type 2 diabetes mellitus with foot ulcer: Secondary | ICD-10-CM | POA: Diagnosis not present

## 2018-06-03 DIAGNOSIS — L97415 Non-pressure chronic ulcer of right heel and midfoot with muscle involvement without evidence of necrosis: Secondary | ICD-10-CM | POA: Diagnosis not present

## 2018-06-03 DIAGNOSIS — S92521D Displaced fracture of medial phalanx of right lesser toe(s), subsequent encounter for fracture with routine healing: Secondary | ICD-10-CM | POA: Diagnosis not present

## 2018-06-03 DIAGNOSIS — Z9181 History of falling: Secondary | ICD-10-CM | POA: Diagnosis not present

## 2018-06-03 DIAGNOSIS — E612 Magnesium deficiency: Secondary | ICD-10-CM | POA: Diagnosis not present

## 2018-06-03 DIAGNOSIS — J449 Chronic obstructive pulmonary disease, unspecified: Secondary | ICD-10-CM | POA: Diagnosis not present

## 2018-06-03 DIAGNOSIS — I10 Essential (primary) hypertension: Secondary | ICD-10-CM | POA: Diagnosis not present

## 2018-06-03 DIAGNOSIS — Z79891 Long term (current) use of opiate analgesic: Secondary | ICD-10-CM | POA: Diagnosis not present

## 2018-06-03 DIAGNOSIS — E1142 Type 2 diabetes mellitus with diabetic polyneuropathy: Secondary | ICD-10-CM | POA: Diagnosis not present

## 2018-06-03 DIAGNOSIS — E559 Vitamin D deficiency, unspecified: Secondary | ICD-10-CM | POA: Diagnosis not present

## 2018-06-07 DIAGNOSIS — E11621 Type 2 diabetes mellitus with foot ulcer: Secondary | ICD-10-CM | POA: Diagnosis not present

## 2018-06-07 DIAGNOSIS — I10 Essential (primary) hypertension: Secondary | ICD-10-CM | POA: Diagnosis not present

## 2018-06-07 DIAGNOSIS — Z794 Long term (current) use of insulin: Secondary | ICD-10-CM | POA: Diagnosis not present

## 2018-06-07 DIAGNOSIS — E612 Magnesium deficiency: Secondary | ICD-10-CM | POA: Diagnosis not present

## 2018-06-07 DIAGNOSIS — Z79891 Long term (current) use of opiate analgesic: Secondary | ICD-10-CM | POA: Diagnosis not present

## 2018-06-07 DIAGNOSIS — S92521D Displaced fracture of medial phalanx of right lesser toe(s), subsequent encounter for fracture with routine healing: Secondary | ICD-10-CM | POA: Diagnosis not present

## 2018-06-07 DIAGNOSIS — E1165 Type 2 diabetes mellitus with hyperglycemia: Secondary | ICD-10-CM | POA: Diagnosis not present

## 2018-06-07 DIAGNOSIS — Z9181 History of falling: Secondary | ICD-10-CM | POA: Diagnosis not present

## 2018-06-07 DIAGNOSIS — J449 Chronic obstructive pulmonary disease, unspecified: Secondary | ICD-10-CM | POA: Diagnosis not present

## 2018-06-07 DIAGNOSIS — E1142 Type 2 diabetes mellitus with diabetic polyneuropathy: Secondary | ICD-10-CM | POA: Diagnosis not present

## 2018-06-07 DIAGNOSIS — L97415 Non-pressure chronic ulcer of right heel and midfoot with muscle involvement without evidence of necrosis: Secondary | ICD-10-CM | POA: Diagnosis not present

## 2018-06-07 DIAGNOSIS — D649 Anemia, unspecified: Secondary | ICD-10-CM | POA: Diagnosis not present

## 2018-06-07 DIAGNOSIS — E559 Vitamin D deficiency, unspecified: Secondary | ICD-10-CM | POA: Diagnosis not present

## 2018-06-09 DIAGNOSIS — L97512 Non-pressure chronic ulcer of other part of right foot with fat layer exposed: Secondary | ICD-10-CM | POA: Diagnosis not present

## 2018-06-09 DIAGNOSIS — E11621 Type 2 diabetes mellitus with foot ulcer: Secondary | ICD-10-CM | POA: Diagnosis not present

## 2018-06-09 DIAGNOSIS — L97412 Non-pressure chronic ulcer of right heel and midfoot with fat layer exposed: Secondary | ICD-10-CM | POA: Diagnosis not present

## 2018-06-09 DIAGNOSIS — Z87891 Personal history of nicotine dependence: Secondary | ICD-10-CM | POA: Diagnosis not present

## 2018-06-09 DIAGNOSIS — L97522 Non-pressure chronic ulcer of other part of left foot with fat layer exposed: Secondary | ICD-10-CM | POA: Diagnosis not present

## 2018-06-13 DIAGNOSIS — E11621 Type 2 diabetes mellitus with foot ulcer: Secondary | ICD-10-CM | POA: Diagnosis not present

## 2018-06-13 DIAGNOSIS — L97415 Non-pressure chronic ulcer of right heel and midfoot with muscle involvement without evidence of necrosis: Secondary | ICD-10-CM | POA: Diagnosis not present

## 2018-06-13 DIAGNOSIS — E1142 Type 2 diabetes mellitus with diabetic polyneuropathy: Secondary | ICD-10-CM | POA: Diagnosis not present

## 2018-06-13 DIAGNOSIS — E612 Magnesium deficiency: Secondary | ICD-10-CM | POA: Diagnosis not present

## 2018-06-13 DIAGNOSIS — D649 Anemia, unspecified: Secondary | ICD-10-CM | POA: Diagnosis not present

## 2018-06-13 DIAGNOSIS — E1165 Type 2 diabetes mellitus with hyperglycemia: Secondary | ICD-10-CM | POA: Diagnosis not present

## 2018-06-13 DIAGNOSIS — I10 Essential (primary) hypertension: Secondary | ICD-10-CM | POA: Diagnosis not present

## 2018-06-13 DIAGNOSIS — Z9181 History of falling: Secondary | ICD-10-CM | POA: Diagnosis not present

## 2018-06-13 DIAGNOSIS — Z79891 Long term (current) use of opiate analgesic: Secondary | ICD-10-CM | POA: Diagnosis not present

## 2018-06-13 DIAGNOSIS — Z794 Long term (current) use of insulin: Secondary | ICD-10-CM | POA: Diagnosis not present

## 2018-06-13 DIAGNOSIS — E559 Vitamin D deficiency, unspecified: Secondary | ICD-10-CM | POA: Diagnosis not present

## 2018-06-13 DIAGNOSIS — S92521D Displaced fracture of medial phalanx of right lesser toe(s), subsequent encounter for fracture with routine healing: Secondary | ICD-10-CM | POA: Diagnosis not present

## 2018-06-13 DIAGNOSIS — J449 Chronic obstructive pulmonary disease, unspecified: Secondary | ICD-10-CM | POA: Diagnosis not present

## 2018-06-16 DIAGNOSIS — E11621 Type 2 diabetes mellitus with foot ulcer: Secondary | ICD-10-CM | POA: Diagnosis not present

## 2018-06-16 DIAGNOSIS — L97412 Non-pressure chronic ulcer of right heel and midfoot with fat layer exposed: Secondary | ICD-10-CM | POA: Diagnosis not present

## 2018-06-16 DIAGNOSIS — Z87891 Personal history of nicotine dependence: Secondary | ICD-10-CM | POA: Diagnosis not present

## 2018-06-16 DIAGNOSIS — L97522 Non-pressure chronic ulcer of other part of left foot with fat layer exposed: Secondary | ICD-10-CM | POA: Diagnosis not present

## 2018-06-16 DIAGNOSIS — L97512 Non-pressure chronic ulcer of other part of right foot with fat layer exposed: Secondary | ICD-10-CM | POA: Diagnosis not present

## 2018-06-16 DIAGNOSIS — L89892 Pressure ulcer of other site, stage 2: Secondary | ICD-10-CM | POA: Diagnosis not present

## 2018-06-20 DIAGNOSIS — E1165 Type 2 diabetes mellitus with hyperglycemia: Secondary | ICD-10-CM | POA: Diagnosis not present

## 2018-06-20 DIAGNOSIS — E612 Magnesium deficiency: Secondary | ICD-10-CM | POA: Diagnosis not present

## 2018-06-20 DIAGNOSIS — Z794 Long term (current) use of insulin: Secondary | ICD-10-CM | POA: Diagnosis not present

## 2018-06-20 DIAGNOSIS — E1142 Type 2 diabetes mellitus with diabetic polyneuropathy: Secondary | ICD-10-CM | POA: Diagnosis not present

## 2018-06-20 DIAGNOSIS — Z9181 History of falling: Secondary | ICD-10-CM | POA: Diagnosis not present

## 2018-06-20 DIAGNOSIS — E11621 Type 2 diabetes mellitus with foot ulcer: Secondary | ICD-10-CM | POA: Diagnosis not present

## 2018-06-20 DIAGNOSIS — J449 Chronic obstructive pulmonary disease, unspecified: Secondary | ICD-10-CM | POA: Diagnosis not present

## 2018-06-20 DIAGNOSIS — D649 Anemia, unspecified: Secondary | ICD-10-CM | POA: Diagnosis not present

## 2018-06-20 DIAGNOSIS — L97415 Non-pressure chronic ulcer of right heel and midfoot with muscle involvement without evidence of necrosis: Secondary | ICD-10-CM | POA: Diagnosis not present

## 2018-06-20 DIAGNOSIS — S92521D Displaced fracture of medial phalanx of right lesser toe(s), subsequent encounter for fracture with routine healing: Secondary | ICD-10-CM | POA: Diagnosis not present

## 2018-06-20 DIAGNOSIS — E559 Vitamin D deficiency, unspecified: Secondary | ICD-10-CM | POA: Diagnosis not present

## 2018-06-20 DIAGNOSIS — Z79891 Long term (current) use of opiate analgesic: Secondary | ICD-10-CM | POA: Diagnosis not present

## 2018-06-20 DIAGNOSIS — I10 Essential (primary) hypertension: Secondary | ICD-10-CM | POA: Diagnosis not present

## 2018-06-23 DIAGNOSIS — D649 Anemia, unspecified: Secondary | ICD-10-CM | POA: Diagnosis not present

## 2018-06-23 DIAGNOSIS — E11621 Type 2 diabetes mellitus with foot ulcer: Secondary | ICD-10-CM | POA: Diagnosis not present

## 2018-06-23 DIAGNOSIS — Z9181 History of falling: Secondary | ICD-10-CM | POA: Diagnosis not present

## 2018-06-23 DIAGNOSIS — I10 Essential (primary) hypertension: Secondary | ICD-10-CM | POA: Diagnosis not present

## 2018-06-23 DIAGNOSIS — J449 Chronic obstructive pulmonary disease, unspecified: Secondary | ICD-10-CM | POA: Diagnosis not present

## 2018-06-23 DIAGNOSIS — Z794 Long term (current) use of insulin: Secondary | ICD-10-CM | POA: Diagnosis not present

## 2018-06-23 DIAGNOSIS — Z79891 Long term (current) use of opiate analgesic: Secondary | ICD-10-CM | POA: Diagnosis not present

## 2018-06-23 DIAGNOSIS — E559 Vitamin D deficiency, unspecified: Secondary | ICD-10-CM | POA: Diagnosis not present

## 2018-06-23 DIAGNOSIS — E1142 Type 2 diabetes mellitus with diabetic polyneuropathy: Secondary | ICD-10-CM | POA: Diagnosis not present

## 2018-06-23 DIAGNOSIS — E612 Magnesium deficiency: Secondary | ICD-10-CM | POA: Diagnosis not present

## 2018-06-23 DIAGNOSIS — L97415 Non-pressure chronic ulcer of right heel and midfoot with muscle involvement without evidence of necrosis: Secondary | ICD-10-CM | POA: Diagnosis not present

## 2018-06-23 DIAGNOSIS — S92521D Displaced fracture of medial phalanx of right lesser toe(s), subsequent encounter for fracture with routine healing: Secondary | ICD-10-CM | POA: Diagnosis not present

## 2018-06-23 DIAGNOSIS — E1165 Type 2 diabetes mellitus with hyperglycemia: Secondary | ICD-10-CM | POA: Diagnosis not present

## 2018-06-27 DIAGNOSIS — S92521D Displaced fracture of medial phalanx of right lesser toe(s), subsequent encounter for fracture with routine healing: Secondary | ICD-10-CM | POA: Diagnosis not present

## 2018-06-27 DIAGNOSIS — J449 Chronic obstructive pulmonary disease, unspecified: Secondary | ICD-10-CM | POA: Diagnosis not present

## 2018-06-27 DIAGNOSIS — E559 Vitamin D deficiency, unspecified: Secondary | ICD-10-CM | POA: Diagnosis not present

## 2018-06-27 DIAGNOSIS — L97415 Non-pressure chronic ulcer of right heel and midfoot with muscle involvement without evidence of necrosis: Secondary | ICD-10-CM | POA: Diagnosis not present

## 2018-06-27 DIAGNOSIS — I10 Essential (primary) hypertension: Secondary | ICD-10-CM | POA: Diagnosis not present

## 2018-06-27 DIAGNOSIS — Z794 Long term (current) use of insulin: Secondary | ICD-10-CM | POA: Diagnosis not present

## 2018-06-27 DIAGNOSIS — E1165 Type 2 diabetes mellitus with hyperglycemia: Secondary | ICD-10-CM | POA: Diagnosis not present

## 2018-06-27 DIAGNOSIS — Z9181 History of falling: Secondary | ICD-10-CM | POA: Diagnosis not present

## 2018-06-27 DIAGNOSIS — E1142 Type 2 diabetes mellitus with diabetic polyneuropathy: Secondary | ICD-10-CM | POA: Diagnosis not present

## 2018-06-27 DIAGNOSIS — E11621 Type 2 diabetes mellitus with foot ulcer: Secondary | ICD-10-CM | POA: Diagnosis not present

## 2018-06-27 DIAGNOSIS — Z79891 Long term (current) use of opiate analgesic: Secondary | ICD-10-CM | POA: Diagnosis not present

## 2018-06-27 DIAGNOSIS — E612 Magnesium deficiency: Secondary | ICD-10-CM | POA: Diagnosis not present

## 2018-06-27 DIAGNOSIS — D649 Anemia, unspecified: Secondary | ICD-10-CM | POA: Diagnosis not present

## 2018-06-28 DIAGNOSIS — E119 Type 2 diabetes mellitus without complications: Secondary | ICD-10-CM | POA: Diagnosis not present

## 2018-06-28 DIAGNOSIS — L0390 Cellulitis, unspecified: Secondary | ICD-10-CM | POA: Diagnosis not present

## 2018-06-28 DIAGNOSIS — S32010A Wedge compression fracture of first lumbar vertebra, initial encounter for closed fracture: Secondary | ICD-10-CM | POA: Diagnosis not present

## 2018-06-28 DIAGNOSIS — M545 Low back pain: Secondary | ICD-10-CM | POA: Diagnosis not present

## 2018-06-30 DIAGNOSIS — E11621 Type 2 diabetes mellitus with foot ulcer: Secondary | ICD-10-CM | POA: Diagnosis not present

## 2018-06-30 DIAGNOSIS — L97512 Non-pressure chronic ulcer of other part of right foot with fat layer exposed: Secondary | ICD-10-CM | POA: Diagnosis not present

## 2018-06-30 DIAGNOSIS — L97412 Non-pressure chronic ulcer of right heel and midfoot with fat layer exposed: Secondary | ICD-10-CM | POA: Diagnosis not present

## 2018-06-30 DIAGNOSIS — L97522 Non-pressure chronic ulcer of other part of left foot with fat layer exposed: Secondary | ICD-10-CM | POA: Diagnosis not present

## 2018-07-04 DIAGNOSIS — J449 Chronic obstructive pulmonary disease, unspecified: Secondary | ICD-10-CM | POA: Diagnosis not present

## 2018-07-04 DIAGNOSIS — L97415 Non-pressure chronic ulcer of right heel and midfoot with muscle involvement without evidence of necrosis: Secondary | ICD-10-CM | POA: Diagnosis not present

## 2018-07-04 DIAGNOSIS — Z79899 Other long term (current) drug therapy: Secondary | ICD-10-CM | POA: Diagnosis not present

## 2018-07-04 DIAGNOSIS — E612 Magnesium deficiency: Secondary | ICD-10-CM | POA: Diagnosis not present

## 2018-07-04 DIAGNOSIS — S32010A Wedge compression fracture of first lumbar vertebra, initial encounter for closed fracture: Secondary | ICD-10-CM | POA: Diagnosis not present

## 2018-07-04 DIAGNOSIS — Z79891 Long term (current) use of opiate analgesic: Secondary | ICD-10-CM | POA: Diagnosis not present

## 2018-07-04 DIAGNOSIS — I10 Essential (primary) hypertension: Secondary | ICD-10-CM | POA: Diagnosis not present

## 2018-07-04 DIAGNOSIS — E1142 Type 2 diabetes mellitus with diabetic polyneuropathy: Secondary | ICD-10-CM | POA: Diagnosis not present

## 2018-07-04 DIAGNOSIS — Z9181 History of falling: Secondary | ICD-10-CM | POA: Diagnosis not present

## 2018-07-04 DIAGNOSIS — D649 Anemia, unspecified: Secondary | ICD-10-CM | POA: Diagnosis not present

## 2018-07-04 DIAGNOSIS — E1165 Type 2 diabetes mellitus with hyperglycemia: Secondary | ICD-10-CM | POA: Diagnosis not present

## 2018-07-04 DIAGNOSIS — E11621 Type 2 diabetes mellitus with foot ulcer: Secondary | ICD-10-CM | POA: Diagnosis not present

## 2018-07-04 DIAGNOSIS — E559 Vitamin D deficiency, unspecified: Secondary | ICD-10-CM | POA: Diagnosis not present

## 2018-07-04 DIAGNOSIS — S92521D Displaced fracture of medial phalanx of right lesser toe(s), subsequent encounter for fracture with routine healing: Secondary | ICD-10-CM | POA: Diagnosis not present

## 2018-07-04 DIAGNOSIS — Z794 Long term (current) use of insulin: Secondary | ICD-10-CM | POA: Diagnosis not present

## 2018-07-07 DIAGNOSIS — Z794 Long term (current) use of insulin: Secondary | ICD-10-CM | POA: Diagnosis not present

## 2018-07-07 DIAGNOSIS — E612 Magnesium deficiency: Secondary | ICD-10-CM | POA: Diagnosis not present

## 2018-07-07 DIAGNOSIS — E1142 Type 2 diabetes mellitus with diabetic polyneuropathy: Secondary | ICD-10-CM | POA: Diagnosis not present

## 2018-07-07 DIAGNOSIS — E1165 Type 2 diabetes mellitus with hyperglycemia: Secondary | ICD-10-CM | POA: Diagnosis not present

## 2018-07-07 DIAGNOSIS — E559 Vitamin D deficiency, unspecified: Secondary | ICD-10-CM | POA: Diagnosis not present

## 2018-07-07 DIAGNOSIS — L97415 Non-pressure chronic ulcer of right heel and midfoot with muscle involvement without evidence of necrosis: Secondary | ICD-10-CM | POA: Diagnosis not present

## 2018-07-07 DIAGNOSIS — Z9181 History of falling: Secondary | ICD-10-CM | POA: Diagnosis not present

## 2018-07-07 DIAGNOSIS — D649 Anemia, unspecified: Secondary | ICD-10-CM | POA: Diagnosis not present

## 2018-07-07 DIAGNOSIS — I10 Essential (primary) hypertension: Secondary | ICD-10-CM | POA: Diagnosis not present

## 2018-07-07 DIAGNOSIS — S92521D Displaced fracture of medial phalanx of right lesser toe(s), subsequent encounter for fracture with routine healing: Secondary | ICD-10-CM | POA: Diagnosis not present

## 2018-07-07 DIAGNOSIS — Z79891 Long term (current) use of opiate analgesic: Secondary | ICD-10-CM | POA: Diagnosis not present

## 2018-07-07 DIAGNOSIS — J449 Chronic obstructive pulmonary disease, unspecified: Secondary | ICD-10-CM | POA: Diagnosis not present

## 2018-07-07 DIAGNOSIS — E11621 Type 2 diabetes mellitus with foot ulcer: Secondary | ICD-10-CM | POA: Diagnosis not present

## 2018-07-08 DIAGNOSIS — S32000A Wedge compression fracture of unspecified lumbar vertebra, initial encounter for closed fracture: Secondary | ICD-10-CM | POA: Diagnosis not present

## 2018-07-11 DIAGNOSIS — I10 Essential (primary) hypertension: Secondary | ICD-10-CM | POA: Diagnosis not present

## 2018-07-11 DIAGNOSIS — E559 Vitamin D deficiency, unspecified: Secondary | ICD-10-CM | POA: Diagnosis not present

## 2018-07-11 DIAGNOSIS — D649 Anemia, unspecified: Secondary | ICD-10-CM | POA: Diagnosis not present

## 2018-07-11 DIAGNOSIS — Z79891 Long term (current) use of opiate analgesic: Secondary | ICD-10-CM | POA: Diagnosis not present

## 2018-07-11 DIAGNOSIS — J449 Chronic obstructive pulmonary disease, unspecified: Secondary | ICD-10-CM | POA: Diagnosis not present

## 2018-07-11 DIAGNOSIS — E1165 Type 2 diabetes mellitus with hyperglycemia: Secondary | ICD-10-CM | POA: Diagnosis not present

## 2018-07-11 DIAGNOSIS — E11621 Type 2 diabetes mellitus with foot ulcer: Secondary | ICD-10-CM | POA: Diagnosis not present

## 2018-07-11 DIAGNOSIS — Z9181 History of falling: Secondary | ICD-10-CM | POA: Diagnosis not present

## 2018-07-11 DIAGNOSIS — L97415 Non-pressure chronic ulcer of right heel and midfoot with muscle involvement without evidence of necrosis: Secondary | ICD-10-CM | POA: Diagnosis not present

## 2018-07-11 DIAGNOSIS — S92521D Displaced fracture of medial phalanx of right lesser toe(s), subsequent encounter for fracture with routine healing: Secondary | ICD-10-CM | POA: Diagnosis not present

## 2018-07-11 DIAGNOSIS — E1142 Type 2 diabetes mellitus with diabetic polyneuropathy: Secondary | ICD-10-CM | POA: Diagnosis not present

## 2018-07-11 DIAGNOSIS — E612 Magnesium deficiency: Secondary | ICD-10-CM | POA: Diagnosis not present

## 2018-07-11 DIAGNOSIS — Z794 Long term (current) use of insulin: Secondary | ICD-10-CM | POA: Diagnosis not present

## 2018-07-14 DIAGNOSIS — E11621 Type 2 diabetes mellitus with foot ulcer: Secondary | ICD-10-CM | POA: Diagnosis not present

## 2018-07-14 DIAGNOSIS — L97412 Non-pressure chronic ulcer of right heel and midfoot with fat layer exposed: Secondary | ICD-10-CM | POA: Diagnosis not present

## 2018-07-14 DIAGNOSIS — L97522 Non-pressure chronic ulcer of other part of left foot with fat layer exposed: Secondary | ICD-10-CM | POA: Diagnosis not present

## 2018-07-14 DIAGNOSIS — Z87891 Personal history of nicotine dependence: Secondary | ICD-10-CM | POA: Diagnosis not present

## 2018-07-18 DIAGNOSIS — E1142 Type 2 diabetes mellitus with diabetic polyneuropathy: Secondary | ICD-10-CM | POA: Diagnosis not present

## 2018-07-18 DIAGNOSIS — Z9181 History of falling: Secondary | ICD-10-CM | POA: Diagnosis not present

## 2018-07-18 DIAGNOSIS — D649 Anemia, unspecified: Secondary | ICD-10-CM | POA: Diagnosis not present

## 2018-07-18 DIAGNOSIS — L97415 Non-pressure chronic ulcer of right heel and midfoot with muscle involvement without evidence of necrosis: Secondary | ICD-10-CM | POA: Diagnosis not present

## 2018-07-18 DIAGNOSIS — E559 Vitamin D deficiency, unspecified: Secondary | ICD-10-CM | POA: Diagnosis not present

## 2018-07-18 DIAGNOSIS — I10 Essential (primary) hypertension: Secondary | ICD-10-CM | POA: Diagnosis not present

## 2018-07-18 DIAGNOSIS — E1165 Type 2 diabetes mellitus with hyperglycemia: Secondary | ICD-10-CM | POA: Diagnosis not present

## 2018-07-18 DIAGNOSIS — S92521D Displaced fracture of medial phalanx of right lesser toe(s), subsequent encounter for fracture with routine healing: Secondary | ICD-10-CM | POA: Diagnosis not present

## 2018-07-18 DIAGNOSIS — J449 Chronic obstructive pulmonary disease, unspecified: Secondary | ICD-10-CM | POA: Diagnosis not present

## 2018-07-18 DIAGNOSIS — Z79891 Long term (current) use of opiate analgesic: Secondary | ICD-10-CM | POA: Diagnosis not present

## 2018-07-18 DIAGNOSIS — Z794 Long term (current) use of insulin: Secondary | ICD-10-CM | POA: Diagnosis not present

## 2018-07-18 DIAGNOSIS — E11621 Type 2 diabetes mellitus with foot ulcer: Secondary | ICD-10-CM | POA: Diagnosis not present

## 2018-07-18 DIAGNOSIS — E612 Magnesium deficiency: Secondary | ICD-10-CM | POA: Diagnosis not present

## 2018-07-21 DIAGNOSIS — D649 Anemia, unspecified: Secondary | ICD-10-CM | POA: Diagnosis not present

## 2018-07-21 DIAGNOSIS — S92521D Displaced fracture of medial phalanx of right lesser toe(s), subsequent encounter for fracture with routine healing: Secondary | ICD-10-CM | POA: Diagnosis not present

## 2018-07-21 DIAGNOSIS — Z9181 History of falling: Secondary | ICD-10-CM | POA: Diagnosis not present

## 2018-07-21 DIAGNOSIS — E11621 Type 2 diabetes mellitus with foot ulcer: Secondary | ICD-10-CM | POA: Diagnosis not present

## 2018-07-21 DIAGNOSIS — E1142 Type 2 diabetes mellitus with diabetic polyneuropathy: Secondary | ICD-10-CM | POA: Diagnosis not present

## 2018-07-21 DIAGNOSIS — E612 Magnesium deficiency: Secondary | ICD-10-CM | POA: Diagnosis not present

## 2018-07-21 DIAGNOSIS — L97415 Non-pressure chronic ulcer of right heel and midfoot with muscle involvement without evidence of necrosis: Secondary | ICD-10-CM | POA: Diagnosis not present

## 2018-07-21 DIAGNOSIS — E559 Vitamin D deficiency, unspecified: Secondary | ICD-10-CM | POA: Diagnosis not present

## 2018-07-21 DIAGNOSIS — J449 Chronic obstructive pulmonary disease, unspecified: Secondary | ICD-10-CM | POA: Diagnosis not present

## 2018-07-21 DIAGNOSIS — Z79891 Long term (current) use of opiate analgesic: Secondary | ICD-10-CM | POA: Diagnosis not present

## 2018-07-21 DIAGNOSIS — E1165 Type 2 diabetes mellitus with hyperglycemia: Secondary | ICD-10-CM | POA: Diagnosis not present

## 2018-07-21 DIAGNOSIS — Z794 Long term (current) use of insulin: Secondary | ICD-10-CM | POA: Diagnosis not present

## 2018-07-21 DIAGNOSIS — I10 Essential (primary) hypertension: Secondary | ICD-10-CM | POA: Diagnosis not present

## 2018-07-25 DIAGNOSIS — E559 Vitamin D deficiency, unspecified: Secondary | ICD-10-CM | POA: Diagnosis not present

## 2018-07-25 DIAGNOSIS — E612 Magnesium deficiency: Secondary | ICD-10-CM | POA: Diagnosis not present

## 2018-07-25 DIAGNOSIS — L97415 Non-pressure chronic ulcer of right heel and midfoot with muscle involvement without evidence of necrosis: Secondary | ICD-10-CM | POA: Diagnosis not present

## 2018-07-25 DIAGNOSIS — J449 Chronic obstructive pulmonary disease, unspecified: Secondary | ICD-10-CM | POA: Diagnosis not present

## 2018-07-25 DIAGNOSIS — D649 Anemia, unspecified: Secondary | ICD-10-CM | POA: Diagnosis not present

## 2018-07-25 DIAGNOSIS — E1142 Type 2 diabetes mellitus with diabetic polyneuropathy: Secondary | ICD-10-CM | POA: Diagnosis not present

## 2018-07-25 DIAGNOSIS — Z794 Long term (current) use of insulin: Secondary | ICD-10-CM | POA: Diagnosis not present

## 2018-07-25 DIAGNOSIS — S92521D Displaced fracture of medial phalanx of right lesser toe(s), subsequent encounter for fracture with routine healing: Secondary | ICD-10-CM | POA: Diagnosis not present

## 2018-07-25 DIAGNOSIS — I10 Essential (primary) hypertension: Secondary | ICD-10-CM | POA: Diagnosis not present

## 2018-07-25 DIAGNOSIS — Z9181 History of falling: Secondary | ICD-10-CM | POA: Diagnosis not present

## 2018-07-25 DIAGNOSIS — E1165 Type 2 diabetes mellitus with hyperglycemia: Secondary | ICD-10-CM | POA: Diagnosis not present

## 2018-07-25 DIAGNOSIS — Z79891 Long term (current) use of opiate analgesic: Secondary | ICD-10-CM | POA: Diagnosis not present

## 2018-07-25 DIAGNOSIS — E11621 Type 2 diabetes mellitus with foot ulcer: Secondary | ICD-10-CM | POA: Diagnosis not present

## 2018-07-27 DIAGNOSIS — E11621 Type 2 diabetes mellitus with foot ulcer: Secondary | ICD-10-CM | POA: Diagnosis not present

## 2018-07-27 DIAGNOSIS — L97412 Non-pressure chronic ulcer of right heel and midfoot with fat layer exposed: Secondary | ICD-10-CM | POA: Diagnosis not present

## 2018-07-27 DIAGNOSIS — L84 Corns and callosities: Secondary | ICD-10-CM | POA: Diagnosis not present

## 2018-07-27 DIAGNOSIS — Z87891 Personal history of nicotine dependence: Secondary | ICD-10-CM | POA: Diagnosis not present

## 2018-07-27 DIAGNOSIS — L97522 Non-pressure chronic ulcer of other part of left foot with fat layer exposed: Secondary | ICD-10-CM | POA: Diagnosis not present

## 2018-07-28 DIAGNOSIS — L0390 Cellulitis, unspecified: Secondary | ICD-10-CM | POA: Diagnosis not present

## 2018-08-01 DIAGNOSIS — Z9181 History of falling: Secondary | ICD-10-CM | POA: Diagnosis not present

## 2018-08-01 DIAGNOSIS — I10 Essential (primary) hypertension: Secondary | ICD-10-CM | POA: Diagnosis not present

## 2018-08-01 DIAGNOSIS — Z79891 Long term (current) use of opiate analgesic: Secondary | ICD-10-CM | POA: Diagnosis not present

## 2018-08-01 DIAGNOSIS — S92521D Displaced fracture of medial phalanx of right lesser toe(s), subsequent encounter for fracture with routine healing: Secondary | ICD-10-CM | POA: Diagnosis not present

## 2018-08-01 DIAGNOSIS — D649 Anemia, unspecified: Secondary | ICD-10-CM | POA: Diagnosis not present

## 2018-08-01 DIAGNOSIS — J449 Chronic obstructive pulmonary disease, unspecified: Secondary | ICD-10-CM | POA: Diagnosis not present

## 2018-08-01 DIAGNOSIS — E612 Magnesium deficiency: Secondary | ICD-10-CM | POA: Diagnosis not present

## 2018-08-01 DIAGNOSIS — E1142 Type 2 diabetes mellitus with diabetic polyneuropathy: Secondary | ICD-10-CM | POA: Diagnosis not present

## 2018-08-01 DIAGNOSIS — E1165 Type 2 diabetes mellitus with hyperglycemia: Secondary | ICD-10-CM | POA: Diagnosis not present

## 2018-08-01 DIAGNOSIS — E11621 Type 2 diabetes mellitus with foot ulcer: Secondary | ICD-10-CM | POA: Diagnosis not present

## 2018-08-01 DIAGNOSIS — Z794 Long term (current) use of insulin: Secondary | ICD-10-CM | POA: Diagnosis not present

## 2018-08-01 DIAGNOSIS — L97415 Non-pressure chronic ulcer of right heel and midfoot with muscle involvement without evidence of necrosis: Secondary | ICD-10-CM | POA: Diagnosis not present

## 2018-08-01 DIAGNOSIS — E559 Vitamin D deficiency, unspecified: Secondary | ICD-10-CM | POA: Diagnosis not present

## 2018-08-04 DIAGNOSIS — E559 Vitamin D deficiency, unspecified: Secondary | ICD-10-CM | POA: Diagnosis not present

## 2018-08-04 DIAGNOSIS — Z794 Long term (current) use of insulin: Secondary | ICD-10-CM | POA: Diagnosis not present

## 2018-08-04 DIAGNOSIS — I10 Essential (primary) hypertension: Secondary | ICD-10-CM | POA: Diagnosis not present

## 2018-08-04 DIAGNOSIS — E1165 Type 2 diabetes mellitus with hyperglycemia: Secondary | ICD-10-CM | POA: Diagnosis not present

## 2018-08-04 DIAGNOSIS — E1142 Type 2 diabetes mellitus with diabetic polyneuropathy: Secondary | ICD-10-CM | POA: Diagnosis not present

## 2018-08-04 DIAGNOSIS — E612 Magnesium deficiency: Secondary | ICD-10-CM | POA: Diagnosis not present

## 2018-08-04 DIAGNOSIS — D649 Anemia, unspecified: Secondary | ICD-10-CM | POA: Diagnosis not present

## 2018-08-04 DIAGNOSIS — Z79891 Long term (current) use of opiate analgesic: Secondary | ICD-10-CM | POA: Diagnosis not present

## 2018-08-04 DIAGNOSIS — E11621 Type 2 diabetes mellitus with foot ulcer: Secondary | ICD-10-CM | POA: Diagnosis not present

## 2018-08-04 DIAGNOSIS — Z9181 History of falling: Secondary | ICD-10-CM | POA: Diagnosis not present

## 2018-08-04 DIAGNOSIS — L97415 Non-pressure chronic ulcer of right heel and midfoot with muscle involvement without evidence of necrosis: Secondary | ICD-10-CM | POA: Diagnosis not present

## 2018-08-04 DIAGNOSIS — J449 Chronic obstructive pulmonary disease, unspecified: Secondary | ICD-10-CM | POA: Diagnosis not present

## 2018-08-04 DIAGNOSIS — S92521D Displaced fracture of medial phalanx of right lesser toe(s), subsequent encounter for fracture with routine healing: Secondary | ICD-10-CM | POA: Diagnosis not present

## 2018-08-08 DIAGNOSIS — S92521D Displaced fracture of medial phalanx of right lesser toe(s), subsequent encounter for fracture with routine healing: Secondary | ICD-10-CM | POA: Diagnosis not present

## 2018-08-08 DIAGNOSIS — K219 Gastro-esophageal reflux disease without esophagitis: Secondary | ICD-10-CM | POA: Diagnosis not present

## 2018-08-08 DIAGNOSIS — D649 Anemia, unspecified: Secondary | ICD-10-CM | POA: Diagnosis not present

## 2018-08-08 DIAGNOSIS — E1140 Type 2 diabetes mellitus with diabetic neuropathy, unspecified: Secondary | ICD-10-CM | POA: Diagnosis not present

## 2018-08-08 DIAGNOSIS — E559 Vitamin D deficiency, unspecified: Secondary | ICD-10-CM | POA: Diagnosis not present

## 2018-08-08 DIAGNOSIS — E11621 Type 2 diabetes mellitus with foot ulcer: Secondary | ICD-10-CM | POA: Diagnosis not present

## 2018-08-08 DIAGNOSIS — J449 Chronic obstructive pulmonary disease, unspecified: Secondary | ICD-10-CM | POA: Diagnosis not present

## 2018-08-08 DIAGNOSIS — Z9181 History of falling: Secondary | ICD-10-CM | POA: Diagnosis not present

## 2018-08-08 DIAGNOSIS — E1165 Type 2 diabetes mellitus with hyperglycemia: Secondary | ICD-10-CM | POA: Diagnosis not present

## 2018-08-08 DIAGNOSIS — E7800 Pure hypercholesterolemia, unspecified: Secondary | ICD-10-CM | POA: Diagnosis not present

## 2018-08-08 DIAGNOSIS — Z794 Long term (current) use of insulin: Secondary | ICD-10-CM | POA: Diagnosis not present

## 2018-08-08 DIAGNOSIS — I10 Essential (primary) hypertension: Secondary | ICD-10-CM | POA: Diagnosis not present

## 2018-08-08 DIAGNOSIS — Z79891 Long term (current) use of opiate analgesic: Secondary | ICD-10-CM | POA: Diagnosis not present

## 2018-08-08 DIAGNOSIS — E612 Magnesium deficiency: Secondary | ICD-10-CM | POA: Diagnosis not present

## 2018-08-08 DIAGNOSIS — L97415 Non-pressure chronic ulcer of right heel and midfoot with muscle involvement without evidence of necrosis: Secondary | ICD-10-CM | POA: Diagnosis not present

## 2018-08-08 DIAGNOSIS — E1142 Type 2 diabetes mellitus with diabetic polyneuropathy: Secondary | ICD-10-CM | POA: Diagnosis not present

## 2018-08-09 DIAGNOSIS — M5136 Other intervertebral disc degeneration, lumbar region: Secondary | ICD-10-CM | POA: Diagnosis not present

## 2018-08-09 DIAGNOSIS — E119 Type 2 diabetes mellitus without complications: Secondary | ICD-10-CM | POA: Diagnosis not present

## 2018-08-09 DIAGNOSIS — G894 Chronic pain syndrome: Secondary | ICD-10-CM | POA: Diagnosis not present

## 2018-08-11 DIAGNOSIS — E11621 Type 2 diabetes mellitus with foot ulcer: Secondary | ICD-10-CM | POA: Diagnosis not present

## 2018-08-11 DIAGNOSIS — L97412 Non-pressure chronic ulcer of right heel and midfoot with fat layer exposed: Secondary | ICD-10-CM | POA: Diagnosis not present

## 2018-08-11 DIAGNOSIS — L97522 Non-pressure chronic ulcer of other part of left foot with fat layer exposed: Secondary | ICD-10-CM | POA: Diagnosis not present

## 2018-08-11 DIAGNOSIS — L84 Corns and callosities: Secondary | ICD-10-CM | POA: Diagnosis not present

## 2018-08-15 DIAGNOSIS — E1165 Type 2 diabetes mellitus with hyperglycemia: Secondary | ICD-10-CM | POA: Diagnosis not present

## 2018-08-15 DIAGNOSIS — E612 Magnesium deficiency: Secondary | ICD-10-CM | POA: Diagnosis not present

## 2018-08-15 DIAGNOSIS — Z794 Long term (current) use of insulin: Secondary | ICD-10-CM | POA: Diagnosis not present

## 2018-08-15 DIAGNOSIS — E1142 Type 2 diabetes mellitus with diabetic polyneuropathy: Secondary | ICD-10-CM | POA: Diagnosis not present

## 2018-08-15 DIAGNOSIS — S92521D Displaced fracture of medial phalanx of right lesser toe(s), subsequent encounter for fracture with routine healing: Secondary | ICD-10-CM | POA: Diagnosis not present

## 2018-08-15 DIAGNOSIS — Z79891 Long term (current) use of opiate analgesic: Secondary | ICD-10-CM | POA: Diagnosis not present

## 2018-08-15 DIAGNOSIS — E11621 Type 2 diabetes mellitus with foot ulcer: Secondary | ICD-10-CM | POA: Diagnosis not present

## 2018-08-15 DIAGNOSIS — E559 Vitamin D deficiency, unspecified: Secondary | ICD-10-CM | POA: Diagnosis not present

## 2018-08-15 DIAGNOSIS — J449 Chronic obstructive pulmonary disease, unspecified: Secondary | ICD-10-CM | POA: Diagnosis not present

## 2018-08-15 DIAGNOSIS — Z9181 History of falling: Secondary | ICD-10-CM | POA: Diagnosis not present

## 2018-08-15 DIAGNOSIS — I10 Essential (primary) hypertension: Secondary | ICD-10-CM | POA: Diagnosis not present

## 2018-08-15 DIAGNOSIS — L97415 Non-pressure chronic ulcer of right heel and midfoot with muscle involvement without evidence of necrosis: Secondary | ICD-10-CM | POA: Diagnosis not present

## 2018-08-15 DIAGNOSIS — D649 Anemia, unspecified: Secondary | ICD-10-CM | POA: Diagnosis not present

## 2018-08-18 DIAGNOSIS — R0789 Other chest pain: Secondary | ICD-10-CM | POA: Diagnosis not present

## 2018-08-18 DIAGNOSIS — L97412 Non-pressure chronic ulcer of right heel and midfoot with fat layer exposed: Secondary | ICD-10-CM | POA: Diagnosis not present

## 2018-08-18 DIAGNOSIS — I10 Essential (primary) hypertension: Secondary | ICD-10-CM | POA: Diagnosis not present

## 2018-08-18 DIAGNOSIS — K219 Gastro-esophageal reflux disease without esophagitis: Secondary | ICD-10-CM | POA: Diagnosis not present

## 2018-08-18 DIAGNOSIS — L97522 Non-pressure chronic ulcer of other part of left foot with fat layer exposed: Secondary | ICD-10-CM | POA: Diagnosis not present

## 2018-08-18 DIAGNOSIS — Z87891 Personal history of nicotine dependence: Secondary | ICD-10-CM | POA: Diagnosis not present

## 2018-08-18 DIAGNOSIS — J449 Chronic obstructive pulmonary disease, unspecified: Secondary | ICD-10-CM | POA: Diagnosis not present

## 2018-08-18 DIAGNOSIS — E11621 Type 2 diabetes mellitus with foot ulcer: Secondary | ICD-10-CM | POA: Diagnosis not present

## 2018-08-18 DIAGNOSIS — Z79899 Other long term (current) drug therapy: Secondary | ICD-10-CM | POA: Diagnosis not present

## 2018-08-19 DIAGNOSIS — J449 Chronic obstructive pulmonary disease, unspecified: Secondary | ICD-10-CM | POA: Diagnosis not present

## 2018-08-22 DIAGNOSIS — J449 Chronic obstructive pulmonary disease, unspecified: Secondary | ICD-10-CM | POA: Diagnosis not present

## 2018-08-23 DIAGNOSIS — Z87891 Personal history of nicotine dependence: Secondary | ICD-10-CM | POA: Diagnosis not present

## 2018-08-23 DIAGNOSIS — L97412 Non-pressure chronic ulcer of right heel and midfoot with fat layer exposed: Secondary | ICD-10-CM | POA: Diagnosis not present

## 2018-08-23 DIAGNOSIS — E11621 Type 2 diabetes mellitus with foot ulcer: Secondary | ICD-10-CM | POA: Diagnosis not present

## 2018-08-23 DIAGNOSIS — L97522 Non-pressure chronic ulcer of other part of left foot with fat layer exposed: Secondary | ICD-10-CM | POA: Diagnosis not present

## 2018-08-26 DIAGNOSIS — E1165 Type 2 diabetes mellitus with hyperglycemia: Secondary | ICD-10-CM | POA: Diagnosis not present

## 2018-08-26 DIAGNOSIS — D649 Anemia, unspecified: Secondary | ICD-10-CM | POA: Diagnosis not present

## 2018-08-26 DIAGNOSIS — Z79891 Long term (current) use of opiate analgesic: Secondary | ICD-10-CM | POA: Diagnosis not present

## 2018-08-26 DIAGNOSIS — Z9181 History of falling: Secondary | ICD-10-CM | POA: Diagnosis not present

## 2018-08-26 DIAGNOSIS — E612 Magnesium deficiency: Secondary | ICD-10-CM | POA: Diagnosis not present

## 2018-08-26 DIAGNOSIS — L97415 Non-pressure chronic ulcer of right heel and midfoot with muscle involvement without evidence of necrosis: Secondary | ICD-10-CM | POA: Diagnosis not present

## 2018-08-26 DIAGNOSIS — Z794 Long term (current) use of insulin: Secondary | ICD-10-CM | POA: Diagnosis not present

## 2018-08-26 DIAGNOSIS — J449 Chronic obstructive pulmonary disease, unspecified: Secondary | ICD-10-CM | POA: Diagnosis not present

## 2018-08-26 DIAGNOSIS — E559 Vitamin D deficiency, unspecified: Secondary | ICD-10-CM | POA: Diagnosis not present

## 2018-08-26 DIAGNOSIS — I10 Essential (primary) hypertension: Secondary | ICD-10-CM | POA: Diagnosis not present

## 2018-08-26 DIAGNOSIS — S92521D Displaced fracture of medial phalanx of right lesser toe(s), subsequent encounter for fracture with routine healing: Secondary | ICD-10-CM | POA: Diagnosis not present

## 2018-08-26 DIAGNOSIS — E11621 Type 2 diabetes mellitus with foot ulcer: Secondary | ICD-10-CM | POA: Diagnosis not present

## 2018-08-26 DIAGNOSIS — E1142 Type 2 diabetes mellitus with diabetic polyneuropathy: Secondary | ICD-10-CM | POA: Diagnosis not present

## 2018-08-28 DIAGNOSIS — L0390 Cellulitis, unspecified: Secondary | ICD-10-CM | POA: Diagnosis not present

## 2018-08-29 DIAGNOSIS — I10 Essential (primary) hypertension: Secondary | ICD-10-CM | POA: Diagnosis not present

## 2018-08-29 DIAGNOSIS — E612 Magnesium deficiency: Secondary | ICD-10-CM | POA: Diagnosis not present

## 2018-08-29 DIAGNOSIS — Z794 Long term (current) use of insulin: Secondary | ICD-10-CM | POA: Diagnosis not present

## 2018-08-29 DIAGNOSIS — D649 Anemia, unspecified: Secondary | ICD-10-CM | POA: Diagnosis not present

## 2018-08-29 DIAGNOSIS — E11621 Type 2 diabetes mellitus with foot ulcer: Secondary | ICD-10-CM | POA: Diagnosis not present

## 2018-08-29 DIAGNOSIS — E1165 Type 2 diabetes mellitus with hyperglycemia: Secondary | ICD-10-CM | POA: Diagnosis not present

## 2018-08-29 DIAGNOSIS — E559 Vitamin D deficiency, unspecified: Secondary | ICD-10-CM | POA: Diagnosis not present

## 2018-08-29 DIAGNOSIS — Z79891 Long term (current) use of opiate analgesic: Secondary | ICD-10-CM | POA: Diagnosis not present

## 2018-08-29 DIAGNOSIS — Z9181 History of falling: Secondary | ICD-10-CM | POA: Diagnosis not present

## 2018-08-29 DIAGNOSIS — E1142 Type 2 diabetes mellitus with diabetic polyneuropathy: Secondary | ICD-10-CM | POA: Diagnosis not present

## 2018-08-29 DIAGNOSIS — S92521D Displaced fracture of medial phalanx of right lesser toe(s), subsequent encounter for fracture with routine healing: Secondary | ICD-10-CM | POA: Diagnosis not present

## 2018-08-29 DIAGNOSIS — L97415 Non-pressure chronic ulcer of right heel and midfoot with muscle involvement without evidence of necrosis: Secondary | ICD-10-CM | POA: Diagnosis not present

## 2018-08-29 DIAGNOSIS — J449 Chronic obstructive pulmonary disease, unspecified: Secondary | ICD-10-CM | POA: Diagnosis not present

## 2018-08-30 DIAGNOSIS — L97422 Non-pressure chronic ulcer of left heel and midfoot with fat layer exposed: Secondary | ICD-10-CM | POA: Diagnosis not present

## 2018-08-30 DIAGNOSIS — L97412 Non-pressure chronic ulcer of right heel and midfoot with fat layer exposed: Secondary | ICD-10-CM | POA: Diagnosis not present

## 2018-08-30 DIAGNOSIS — Z87891 Personal history of nicotine dependence: Secondary | ICD-10-CM | POA: Diagnosis not present

## 2018-08-30 DIAGNOSIS — L97522 Non-pressure chronic ulcer of other part of left foot with fat layer exposed: Secondary | ICD-10-CM | POA: Diagnosis not present

## 2018-08-30 DIAGNOSIS — E11621 Type 2 diabetes mellitus with foot ulcer: Secondary | ICD-10-CM | POA: Diagnosis not present

## 2018-09-05 ENCOUNTER — Other Ambulatory Visit: Payer: Self-pay

## 2018-09-05 NOTE — Patient Outreach (Signed)
[facility] Care Management  09/05/2018  [name] 1961-09-01 [id number]  TELEPHONE SCREENING Referral date: 08/22/18 Referral source:  Utilization management referral Referral reason: co pay assistance for wheelchair Insurance: [employer] Attempt #1  Telephone call to patient regarding utilization management referral. Unable to reach patient. HIPAA compliant voice message left with call back phone number.   PLAN: RNCM will attempt 2nd telephone call to patient within 4 business days.  RNCm will send outreach letter to attempt contact.   [name] InaDavina Shaunae Sieloff RN,BSN,CCM [facility] Telephonic  [phone]

## 2018-09-06 ENCOUNTER — Other Ambulatory Visit: Payer: Self-pay

## 2018-09-06 DIAGNOSIS — E11621 Type 2 diabetes mellitus with foot ulcer: Secondary | ICD-10-CM | POA: Diagnosis not present

## 2018-09-06 DIAGNOSIS — L97522 Non-pressure chronic ulcer of other part of left foot with fat layer exposed: Secondary | ICD-10-CM | POA: Diagnosis not present

## 2018-09-06 DIAGNOSIS — L97412 Non-pressure chronic ulcer of right heel and midfoot with fat layer exposed: Secondary | ICD-10-CM | POA: Diagnosis not present

## 2018-09-06 DIAGNOSIS — Z87891 Personal history of nicotine dependence: Secondary | ICD-10-CM | POA: Diagnosis not present

## 2018-09-06 NOTE — Patient Outreach (Signed)
[facility]) Care Management  09/06/2018  Vernice Jeffersonmy C Pell Aug 23, 1961 [id number]  TELEPHONE SCREENING Referral date: 08/22/18 Referral source:  Utilization management referral Referral reason: co pay assistance for wheelchair Insurance: [employer] Attempt #2  Telephone call to patient regarding utilization management. Unable to reach patient. HIPAA compliant message left with call back phone number.   PLAN; RNCM will attempt 3rd telephone call to patient within 4 business days.   George InaDavina Staley Lunz RN,BSN,CCM [facility] LPHN Telephonic  [phone]

## 2018-09-07 DIAGNOSIS — E119 Type 2 diabetes mellitus without complications: Secondary | ICD-10-CM | POA: Diagnosis not present

## 2018-09-07 DIAGNOSIS — M5136 Other intervertebral disc degeneration, lumbar region: Secondary | ICD-10-CM | POA: Diagnosis not present

## 2018-09-08 DIAGNOSIS — E119 Type 2 diabetes mellitus without complications: Secondary | ICD-10-CM | POA: Diagnosis not present

## 2018-09-09 ENCOUNTER — Ambulatory Visit: Payer: Self-pay

## 2018-09-09 DIAGNOSIS — E1165 Type 2 diabetes mellitus with hyperglycemia: Secondary | ICD-10-CM | POA: Diagnosis not present

## 2018-09-09 DIAGNOSIS — Z79891 Long term (current) use of opiate analgesic: Secondary | ICD-10-CM | POA: Diagnosis not present

## 2018-09-09 DIAGNOSIS — E612 Magnesium deficiency: Secondary | ICD-10-CM | POA: Diagnosis not present

## 2018-09-09 DIAGNOSIS — I10 Essential (primary) hypertension: Secondary | ICD-10-CM | POA: Diagnosis not present

## 2018-09-09 DIAGNOSIS — E559 Vitamin D deficiency, unspecified: Secondary | ICD-10-CM | POA: Diagnosis not present

## 2018-09-09 DIAGNOSIS — J449 Chronic obstructive pulmonary disease, unspecified: Secondary | ICD-10-CM | POA: Diagnosis not present

## 2018-09-09 DIAGNOSIS — E11621 Type 2 diabetes mellitus with foot ulcer: Secondary | ICD-10-CM | POA: Diagnosis not present

## 2018-09-09 DIAGNOSIS — D649 Anemia, unspecified: Secondary | ICD-10-CM | POA: Diagnosis not present

## 2018-09-09 DIAGNOSIS — E1142 Type 2 diabetes mellitus with diabetic polyneuropathy: Secondary | ICD-10-CM | POA: Diagnosis not present

## 2018-09-09 DIAGNOSIS — L97415 Non-pressure chronic ulcer of right heel and midfoot with muscle involvement without evidence of necrosis: Secondary | ICD-10-CM | POA: Diagnosis not present

## 2018-09-09 DIAGNOSIS — S92521D Displaced fracture of medial phalanx of right lesser toe(s), subsequent encounter for fracture with routine healing: Secondary | ICD-10-CM | POA: Diagnosis not present

## 2018-09-09 DIAGNOSIS — Z9181 History of falling: Secondary | ICD-10-CM | POA: Diagnosis not present

## 2018-09-09 DIAGNOSIS — Z794 Long term (current) use of insulin: Secondary | ICD-10-CM | POA: Diagnosis not present

## 2018-09-13 DIAGNOSIS — L97412 Non-pressure chronic ulcer of right heel and midfoot with fat layer exposed: Secondary | ICD-10-CM | POA: Diagnosis not present

## 2018-09-13 DIAGNOSIS — E11621 Type 2 diabetes mellitus with foot ulcer: Secondary | ICD-10-CM | POA: Diagnosis not present

## 2018-09-13 DIAGNOSIS — Z87891 Personal history of nicotine dependence: Secondary | ICD-10-CM | POA: Diagnosis not present

## 2018-09-13 DIAGNOSIS — L97522 Non-pressure chronic ulcer of other part of left foot with fat layer exposed: Secondary | ICD-10-CM | POA: Diagnosis not present

## 2018-09-14 DIAGNOSIS — Z794 Long term (current) use of insulin: Secondary | ICD-10-CM | POA: Diagnosis not present

## 2018-09-14 DIAGNOSIS — E11621 Type 2 diabetes mellitus with foot ulcer: Secondary | ICD-10-CM | POA: Diagnosis not present

## 2018-09-14 DIAGNOSIS — I10 Essential (primary) hypertension: Secondary | ICD-10-CM | POA: Diagnosis not present

## 2018-09-14 DIAGNOSIS — E1165 Type 2 diabetes mellitus with hyperglycemia: Secondary | ICD-10-CM | POA: Diagnosis not present

## 2018-09-14 DIAGNOSIS — Z79891 Long term (current) use of opiate analgesic: Secondary | ICD-10-CM | POA: Diagnosis not present

## 2018-09-14 DIAGNOSIS — Z9181 History of falling: Secondary | ICD-10-CM | POA: Diagnosis not present

## 2018-09-14 DIAGNOSIS — E612 Magnesium deficiency: Secondary | ICD-10-CM | POA: Diagnosis not present

## 2018-09-14 DIAGNOSIS — S92521D Displaced fracture of medial phalanx of right lesser toe(s), subsequent encounter for fracture with routine healing: Secondary | ICD-10-CM | POA: Diagnosis not present

## 2018-09-14 DIAGNOSIS — D649 Anemia, unspecified: Secondary | ICD-10-CM | POA: Diagnosis not present

## 2018-09-14 DIAGNOSIS — L97415 Non-pressure chronic ulcer of right heel and midfoot with muscle involvement without evidence of necrosis: Secondary | ICD-10-CM | POA: Diagnosis not present

## 2018-09-14 DIAGNOSIS — E559 Vitamin D deficiency, unspecified: Secondary | ICD-10-CM | POA: Diagnosis not present

## 2018-09-14 DIAGNOSIS — J449 Chronic obstructive pulmonary disease, unspecified: Secondary | ICD-10-CM | POA: Diagnosis not present

## 2018-09-14 DIAGNOSIS — E1142 Type 2 diabetes mellitus with diabetic polyneuropathy: Secondary | ICD-10-CM | POA: Diagnosis not present

## 2018-09-16 ENCOUNTER — Other Ambulatory Visit: Payer: Self-pay

## 2018-09-16 DIAGNOSIS — E11621 Type 2 diabetes mellitus with foot ulcer: Secondary | ICD-10-CM | POA: Diagnosis not present

## 2018-09-16 DIAGNOSIS — J449 Chronic obstructive pulmonary disease, unspecified: Secondary | ICD-10-CM | POA: Diagnosis not present

## 2018-09-16 DIAGNOSIS — Z9181 History of falling: Secondary | ICD-10-CM | POA: Diagnosis not present

## 2018-09-16 DIAGNOSIS — Z794 Long term (current) use of insulin: Secondary | ICD-10-CM | POA: Diagnosis not present

## 2018-09-16 DIAGNOSIS — S92521D Displaced fracture of medial phalanx of right lesser toe(s), subsequent encounter for fracture with routine healing: Secondary | ICD-10-CM | POA: Diagnosis not present

## 2018-09-16 DIAGNOSIS — E1165 Type 2 diabetes mellitus with hyperglycemia: Secondary | ICD-10-CM | POA: Diagnosis not present

## 2018-09-16 DIAGNOSIS — E1142 Type 2 diabetes mellitus with diabetic polyneuropathy: Secondary | ICD-10-CM | POA: Diagnosis not present

## 2018-09-16 DIAGNOSIS — E559 Vitamin D deficiency, unspecified: Secondary | ICD-10-CM | POA: Diagnosis not present

## 2018-09-16 DIAGNOSIS — E612 Magnesium deficiency: Secondary | ICD-10-CM | POA: Diagnosis not present

## 2018-09-16 DIAGNOSIS — I10 Essential (primary) hypertension: Secondary | ICD-10-CM | POA: Diagnosis not present

## 2018-09-16 DIAGNOSIS — Z79891 Long term (current) use of opiate analgesic: Secondary | ICD-10-CM | POA: Diagnosis not present

## 2018-09-16 DIAGNOSIS — D649 Anemia, unspecified: Secondary | ICD-10-CM | POA: Diagnosis not present

## 2018-09-16 DIAGNOSIS — L97415 Non-pressure chronic ulcer of right heel and midfoot with muscle involvement without evidence of necrosis: Secondary | ICD-10-CM | POA: Diagnosis not present

## 2018-09-16 NOTE — Patient Outreach (Signed)
[facility]) Care Management  09/16/2018  Vernice Jeffersonmy C Baumbach [birth year] [id number]  TELEPHONE SCREENING Referral date:08/22/18 Referral source:Utilization management referral Referral reason:co pay assistance for wheelchair Insurance:[employer] Attempt #3  Telephone call to patient regarding utilization management. Unable to reach patient. HIPAA compliant message left with call back phone number.   PLAN; If no response will proceed with closure.   George InaDavina Chidubem Chaires RN,BSN,CCM [address] [facility] Telephonic  [phone]

## 2018-09-20 DIAGNOSIS — Z794 Long term (current) use of insulin: Secondary | ICD-10-CM | POA: Diagnosis not present

## 2018-09-20 DIAGNOSIS — L97522 Non-pressure chronic ulcer of other part of left foot with fat layer exposed: Secondary | ICD-10-CM | POA: Diagnosis not present

## 2018-09-20 DIAGNOSIS — E11621 Type 2 diabetes mellitus with foot ulcer: Secondary | ICD-10-CM | POA: Diagnosis not present

## 2018-09-20 DIAGNOSIS — L97412 Non-pressure chronic ulcer of right heel and midfoot with fat layer exposed: Secondary | ICD-10-CM | POA: Diagnosis not present

## 2018-09-21 DIAGNOSIS — E11621 Type 2 diabetes mellitus with foot ulcer: Secondary | ICD-10-CM | POA: Diagnosis not present

## 2018-09-21 DIAGNOSIS — E559 Vitamin D deficiency, unspecified: Secondary | ICD-10-CM | POA: Diagnosis not present

## 2018-09-21 DIAGNOSIS — J449 Chronic obstructive pulmonary disease, unspecified: Secondary | ICD-10-CM | POA: Diagnosis not present

## 2018-09-21 DIAGNOSIS — E1142 Type 2 diabetes mellitus with diabetic polyneuropathy: Secondary | ICD-10-CM | POA: Diagnosis not present

## 2018-09-21 DIAGNOSIS — L97415 Non-pressure chronic ulcer of right heel and midfoot with muscle involvement without evidence of necrosis: Secondary | ICD-10-CM | POA: Diagnosis not present

## 2018-09-21 DIAGNOSIS — Z79891 Long term (current) use of opiate analgesic: Secondary | ICD-10-CM | POA: Diagnosis not present

## 2018-09-21 DIAGNOSIS — Z9181 History of falling: Secondary | ICD-10-CM | POA: Diagnosis not present

## 2018-09-21 DIAGNOSIS — M5416 Radiculopathy, lumbar region: Secondary | ICD-10-CM | POA: Diagnosis not present

## 2018-09-21 DIAGNOSIS — Z794 Long term (current) use of insulin: Secondary | ICD-10-CM | POA: Diagnosis not present

## 2018-09-21 DIAGNOSIS — I10 Essential (primary) hypertension: Secondary | ICD-10-CM | POA: Diagnosis not present

## 2018-09-21 DIAGNOSIS — D649 Anemia, unspecified: Secondary | ICD-10-CM | POA: Diagnosis not present

## 2018-09-21 DIAGNOSIS — M5136 Other intervertebral disc degeneration, lumbar region: Secondary | ICD-10-CM | POA: Diagnosis not present

## 2018-09-21 DIAGNOSIS — S92521D Displaced fracture of medial phalanx of right lesser toe(s), subsequent encounter for fracture with routine healing: Secondary | ICD-10-CM | POA: Diagnosis not present

## 2018-09-21 DIAGNOSIS — E612 Magnesium deficiency: Secondary | ICD-10-CM | POA: Diagnosis not present

## 2018-09-21 DIAGNOSIS — G894 Chronic pain syndrome: Secondary | ICD-10-CM | POA: Diagnosis not present

## 2018-09-21 DIAGNOSIS — E1165 Type 2 diabetes mellitus with hyperglycemia: Secondary | ICD-10-CM | POA: Diagnosis not present

## 2018-09-21 DIAGNOSIS — E119 Type 2 diabetes mellitus without complications: Secondary | ICD-10-CM | POA: Diagnosis not present

## 2018-09-23 DIAGNOSIS — Z9181 History of falling: Secondary | ICD-10-CM | POA: Diagnosis not present

## 2018-09-23 DIAGNOSIS — E1165 Type 2 diabetes mellitus with hyperglycemia: Secondary | ICD-10-CM | POA: Diagnosis not present

## 2018-09-23 DIAGNOSIS — L97415 Non-pressure chronic ulcer of right heel and midfoot with muscle involvement without evidence of necrosis: Secondary | ICD-10-CM | POA: Diagnosis not present

## 2018-09-23 DIAGNOSIS — S92521D Displaced fracture of medial phalanx of right lesser toe(s), subsequent encounter for fracture with routine healing: Secondary | ICD-10-CM | POA: Diagnosis not present

## 2018-09-23 DIAGNOSIS — J449 Chronic obstructive pulmonary disease, unspecified: Secondary | ICD-10-CM | POA: Diagnosis not present

## 2018-09-23 DIAGNOSIS — E1142 Type 2 diabetes mellitus with diabetic polyneuropathy: Secondary | ICD-10-CM | POA: Diagnosis not present

## 2018-09-23 DIAGNOSIS — D649 Anemia, unspecified: Secondary | ICD-10-CM | POA: Diagnosis not present

## 2018-09-23 DIAGNOSIS — Z79891 Long term (current) use of opiate analgesic: Secondary | ICD-10-CM | POA: Diagnosis not present

## 2018-09-23 DIAGNOSIS — E559 Vitamin D deficiency, unspecified: Secondary | ICD-10-CM | POA: Diagnosis not present

## 2018-09-23 DIAGNOSIS — I10 Essential (primary) hypertension: Secondary | ICD-10-CM | POA: Diagnosis not present

## 2018-09-23 DIAGNOSIS — Z794 Long term (current) use of insulin: Secondary | ICD-10-CM | POA: Diagnosis not present

## 2018-09-23 DIAGNOSIS — E612 Magnesium deficiency: Secondary | ICD-10-CM | POA: Diagnosis not present

## 2018-09-23 DIAGNOSIS — E11621 Type 2 diabetes mellitus with foot ulcer: Secondary | ICD-10-CM | POA: Diagnosis not present

## 2018-09-27 DIAGNOSIS — L97522 Non-pressure chronic ulcer of other part of left foot with fat layer exposed: Secondary | ICD-10-CM | POA: Diagnosis not present

## 2018-09-27 DIAGNOSIS — E11621 Type 2 diabetes mellitus with foot ulcer: Secondary | ICD-10-CM | POA: Diagnosis not present

## 2018-09-27 DIAGNOSIS — L97412 Non-pressure chronic ulcer of right heel and midfoot with fat layer exposed: Secondary | ICD-10-CM | POA: Diagnosis not present

## 2018-09-27 DIAGNOSIS — L0390 Cellulitis, unspecified: Secondary | ICD-10-CM | POA: Diagnosis not present

## 2018-09-27 DIAGNOSIS — Z87891 Personal history of nicotine dependence: Secondary | ICD-10-CM | POA: Diagnosis not present

## 2018-09-30 DIAGNOSIS — E11621 Type 2 diabetes mellitus with foot ulcer: Secondary | ICD-10-CM | POA: Diagnosis not present

## 2018-09-30 DIAGNOSIS — E559 Vitamin D deficiency, unspecified: Secondary | ICD-10-CM | POA: Diagnosis not present

## 2018-09-30 DIAGNOSIS — E1165 Type 2 diabetes mellitus with hyperglycemia: Secondary | ICD-10-CM | POA: Diagnosis not present

## 2018-09-30 DIAGNOSIS — D649 Anemia, unspecified: Secondary | ICD-10-CM | POA: Diagnosis not present

## 2018-09-30 DIAGNOSIS — E1142 Type 2 diabetes mellitus with diabetic polyneuropathy: Secondary | ICD-10-CM | POA: Diagnosis not present

## 2018-09-30 DIAGNOSIS — J449 Chronic obstructive pulmonary disease, unspecified: Secondary | ICD-10-CM | POA: Diagnosis not present

## 2018-09-30 DIAGNOSIS — S92521D Displaced fracture of medial phalanx of right lesser toe(s), subsequent encounter for fracture with routine healing: Secondary | ICD-10-CM | POA: Diagnosis not present

## 2018-09-30 DIAGNOSIS — Z9181 History of falling: Secondary | ICD-10-CM | POA: Diagnosis not present

## 2018-09-30 DIAGNOSIS — Z794 Long term (current) use of insulin: Secondary | ICD-10-CM | POA: Diagnosis not present

## 2018-09-30 DIAGNOSIS — L97415 Non-pressure chronic ulcer of right heel and midfoot with muscle involvement without evidence of necrosis: Secondary | ICD-10-CM | POA: Diagnosis not present

## 2018-09-30 DIAGNOSIS — I10 Essential (primary) hypertension: Secondary | ICD-10-CM | POA: Diagnosis not present

## 2018-09-30 DIAGNOSIS — E612 Magnesium deficiency: Secondary | ICD-10-CM | POA: Diagnosis not present

## 2018-09-30 DIAGNOSIS — Z79891 Long term (current) use of opiate analgesic: Secondary | ICD-10-CM | POA: Diagnosis not present

## 2018-10-03 ENCOUNTER — Other Ambulatory Visit: Payer: Self-pay

## 2018-10-03 NOTE — Patient Outreach (Signed)
[facility]) Care Management  10/03/2018  [name] 10-27-1960 [id number]   Case closure  / TELEPHONE SCREENING Referral date:08/22/18 Referral source:Utilization management referral Referral reason:co pay assistance for wheelchair Insurance:[employer]  No response after 3 telephone calls and outreach letter attempt.  PLAN: RNCM will close patient due to being unable to reach.  RNCM will send closure notification to patient's primary MD   George InaDavina Burton Gahan RN,BSN,CCM [facility] Telephonic  [phone]

## 2018-10-04 DIAGNOSIS — E11621 Type 2 diabetes mellitus with foot ulcer: Secondary | ICD-10-CM | POA: Diagnosis not present

## 2018-10-04 DIAGNOSIS — L97522 Non-pressure chronic ulcer of other part of left foot with fat layer exposed: Secondary | ICD-10-CM | POA: Diagnosis not present

## 2018-10-04 DIAGNOSIS — L97412 Non-pressure chronic ulcer of right heel and midfoot with fat layer exposed: Secondary | ICD-10-CM | POA: Diagnosis not present

## 2018-10-04 DIAGNOSIS — Z87891 Personal history of nicotine dependence: Secondary | ICD-10-CM | POA: Diagnosis not present

## 2018-10-07 DIAGNOSIS — E1142 Type 2 diabetes mellitus with diabetic polyneuropathy: Secondary | ICD-10-CM | POA: Diagnosis not present

## 2018-10-07 DIAGNOSIS — E559 Vitamin D deficiency, unspecified: Secondary | ICD-10-CM | POA: Diagnosis not present

## 2018-10-07 DIAGNOSIS — E1165 Type 2 diabetes mellitus with hyperglycemia: Secondary | ICD-10-CM | POA: Diagnosis not present

## 2018-10-07 DIAGNOSIS — D649 Anemia, unspecified: Secondary | ICD-10-CM | POA: Diagnosis not present

## 2018-10-07 DIAGNOSIS — I10 Essential (primary) hypertension: Secondary | ICD-10-CM | POA: Diagnosis not present

## 2018-10-07 DIAGNOSIS — J449 Chronic obstructive pulmonary disease, unspecified: Secondary | ICD-10-CM | POA: Diagnosis not present

## 2018-10-07 DIAGNOSIS — L97415 Non-pressure chronic ulcer of right heel and midfoot with muscle involvement without evidence of necrosis: Secondary | ICD-10-CM | POA: Diagnosis not present

## 2018-10-07 DIAGNOSIS — E612 Magnesium deficiency: Secondary | ICD-10-CM | POA: Diagnosis not present

## 2018-10-07 DIAGNOSIS — E11621 Type 2 diabetes mellitus with foot ulcer: Secondary | ICD-10-CM | POA: Diagnosis not present

## 2018-10-07 DIAGNOSIS — Z79891 Long term (current) use of opiate analgesic: Secondary | ICD-10-CM | POA: Diagnosis not present

## 2018-10-07 DIAGNOSIS — S92521D Displaced fracture of medial phalanx of right lesser toe(s), subsequent encounter for fracture with routine healing: Secondary | ICD-10-CM | POA: Diagnosis not present

## 2018-10-07 DIAGNOSIS — Z794 Long term (current) use of insulin: Secondary | ICD-10-CM | POA: Diagnosis not present

## 2018-10-07 DIAGNOSIS — Z9181 History of falling: Secondary | ICD-10-CM | POA: Diagnosis not present

## 2018-10-11 DIAGNOSIS — Z9181 History of falling: Secondary | ICD-10-CM | POA: Diagnosis not present

## 2018-10-11 DIAGNOSIS — Z794 Long term (current) use of insulin: Secondary | ICD-10-CM | POA: Diagnosis not present

## 2018-10-11 DIAGNOSIS — E1165 Type 2 diabetes mellitus with hyperglycemia: Secondary | ICD-10-CM | POA: Diagnosis not present

## 2018-10-11 DIAGNOSIS — E559 Vitamin D deficiency, unspecified: Secondary | ICD-10-CM | POA: Diagnosis not present

## 2018-10-11 DIAGNOSIS — E1142 Type 2 diabetes mellitus with diabetic polyneuropathy: Secondary | ICD-10-CM | POA: Diagnosis not present

## 2018-10-11 DIAGNOSIS — D649 Anemia, unspecified: Secondary | ICD-10-CM | POA: Diagnosis not present

## 2018-10-11 DIAGNOSIS — E612 Magnesium deficiency: Secondary | ICD-10-CM | POA: Diagnosis not present

## 2018-10-11 DIAGNOSIS — J449 Chronic obstructive pulmonary disease, unspecified: Secondary | ICD-10-CM | POA: Diagnosis not present

## 2018-10-11 DIAGNOSIS — L97415 Non-pressure chronic ulcer of right heel and midfoot with muscle involvement without evidence of necrosis: Secondary | ICD-10-CM | POA: Diagnosis not present

## 2018-10-11 DIAGNOSIS — E11621 Type 2 diabetes mellitus with foot ulcer: Secondary | ICD-10-CM | POA: Diagnosis not present

## 2018-10-11 DIAGNOSIS — I10 Essential (primary) hypertension: Secondary | ICD-10-CM | POA: Diagnosis not present

## 2018-10-11 DIAGNOSIS — Z79891 Long term (current) use of opiate analgesic: Secondary | ICD-10-CM | POA: Diagnosis not present

## 2018-10-11 DIAGNOSIS — S92521D Displaced fracture of medial phalanx of right lesser toe(s), subsequent encounter for fracture with routine healing: Secondary | ICD-10-CM | POA: Diagnosis not present

## 2018-10-14 DIAGNOSIS — L97415 Non-pressure chronic ulcer of right heel and midfoot with muscle involvement without evidence of necrosis: Secondary | ICD-10-CM | POA: Diagnosis not present

## 2018-10-14 DIAGNOSIS — J449 Chronic obstructive pulmonary disease, unspecified: Secondary | ICD-10-CM | POA: Diagnosis not present

## 2018-10-14 DIAGNOSIS — E11621 Type 2 diabetes mellitus with foot ulcer: Secondary | ICD-10-CM | POA: Diagnosis not present

## 2018-10-14 DIAGNOSIS — Z79891 Long term (current) use of opiate analgesic: Secondary | ICD-10-CM | POA: Diagnosis not present

## 2018-10-14 DIAGNOSIS — Z9181 History of falling: Secondary | ICD-10-CM | POA: Diagnosis not present

## 2018-10-14 DIAGNOSIS — D649 Anemia, unspecified: Secondary | ICD-10-CM | POA: Diagnosis not present

## 2018-10-14 DIAGNOSIS — S92521D Displaced fracture of medial phalanx of right lesser toe(s), subsequent encounter for fracture with routine healing: Secondary | ICD-10-CM | POA: Diagnosis not present

## 2018-10-14 DIAGNOSIS — E1165 Type 2 diabetes mellitus with hyperglycemia: Secondary | ICD-10-CM | POA: Diagnosis not present

## 2018-10-14 DIAGNOSIS — E559 Vitamin D deficiency, unspecified: Secondary | ICD-10-CM | POA: Diagnosis not present

## 2018-10-14 DIAGNOSIS — I10 Essential (primary) hypertension: Secondary | ICD-10-CM | POA: Diagnosis not present

## 2018-10-14 DIAGNOSIS — E1142 Type 2 diabetes mellitus with diabetic polyneuropathy: Secondary | ICD-10-CM | POA: Diagnosis not present

## 2018-10-14 DIAGNOSIS — E612 Magnesium deficiency: Secondary | ICD-10-CM | POA: Diagnosis not present

## 2018-10-14 DIAGNOSIS — Z794 Long term (current) use of insulin: Secondary | ICD-10-CM | POA: Diagnosis not present

## 2018-10-18 DIAGNOSIS — E11621 Type 2 diabetes mellitus with foot ulcer: Secondary | ICD-10-CM | POA: Diagnosis not present

## 2018-10-18 DIAGNOSIS — L97522 Non-pressure chronic ulcer of other part of left foot with fat layer exposed: Secondary | ICD-10-CM | POA: Diagnosis not present

## 2018-10-18 DIAGNOSIS — Z87891 Personal history of nicotine dependence: Secondary | ICD-10-CM | POA: Diagnosis not present

## 2018-10-18 DIAGNOSIS — L97412 Non-pressure chronic ulcer of right heel and midfoot with fat layer exposed: Secondary | ICD-10-CM | POA: Diagnosis not present

## 2018-10-21 DIAGNOSIS — E559 Vitamin D deficiency, unspecified: Secondary | ICD-10-CM | POA: Diagnosis not present

## 2018-10-21 DIAGNOSIS — S92521D Displaced fracture of medial phalanx of right lesser toe(s), subsequent encounter for fracture with routine healing: Secondary | ICD-10-CM | POA: Diagnosis not present

## 2018-10-21 DIAGNOSIS — E1165 Type 2 diabetes mellitus with hyperglycemia: Secondary | ICD-10-CM | POA: Diagnosis not present

## 2018-10-21 DIAGNOSIS — E11621 Type 2 diabetes mellitus with foot ulcer: Secondary | ICD-10-CM | POA: Diagnosis not present

## 2018-10-21 DIAGNOSIS — Z794 Long term (current) use of insulin: Secondary | ICD-10-CM | POA: Diagnosis not present

## 2018-10-21 DIAGNOSIS — J449 Chronic obstructive pulmonary disease, unspecified: Secondary | ICD-10-CM | POA: Diagnosis not present

## 2018-10-21 DIAGNOSIS — L97415 Non-pressure chronic ulcer of right heel and midfoot with muscle involvement without evidence of necrosis: Secondary | ICD-10-CM | POA: Diagnosis not present

## 2018-10-21 DIAGNOSIS — D649 Anemia, unspecified: Secondary | ICD-10-CM | POA: Diagnosis not present

## 2018-10-21 DIAGNOSIS — I10 Essential (primary) hypertension: Secondary | ICD-10-CM | POA: Diagnosis not present

## 2018-10-21 DIAGNOSIS — E612 Magnesium deficiency: Secondary | ICD-10-CM | POA: Diagnosis not present

## 2018-10-21 DIAGNOSIS — E1142 Type 2 diabetes mellitus with diabetic polyneuropathy: Secondary | ICD-10-CM | POA: Diagnosis not present

## 2018-10-21 DIAGNOSIS — Z79891 Long term (current) use of opiate analgesic: Secondary | ICD-10-CM | POA: Diagnosis not present

## 2018-10-21 DIAGNOSIS — Z9181 History of falling: Secondary | ICD-10-CM | POA: Diagnosis not present

## 2018-10-22 DIAGNOSIS — J449 Chronic obstructive pulmonary disease, unspecified: Secondary | ICD-10-CM | POA: Diagnosis not present

## 2018-10-25 DIAGNOSIS — E11621 Type 2 diabetes mellitus with foot ulcer: Secondary | ICD-10-CM | POA: Diagnosis not present

## 2018-10-25 DIAGNOSIS — L97412 Non-pressure chronic ulcer of right heel and midfoot with fat layer exposed: Secondary | ICD-10-CM | POA: Diagnosis not present

## 2018-10-25 DIAGNOSIS — Z87891 Personal history of nicotine dependence: Secondary | ICD-10-CM | POA: Diagnosis not present

## 2018-10-25 DIAGNOSIS — L97522 Non-pressure chronic ulcer of other part of left foot with fat layer exposed: Secondary | ICD-10-CM | POA: Diagnosis not present

## 2018-10-27 DIAGNOSIS — L97522 Non-pressure chronic ulcer of other part of left foot with fat layer exposed: Secondary | ICD-10-CM | POA: Diagnosis not present

## 2018-10-27 DIAGNOSIS — E11621 Type 2 diabetes mellitus with foot ulcer: Secondary | ICD-10-CM | POA: Diagnosis not present

## 2018-10-27 DIAGNOSIS — Z87891 Personal history of nicotine dependence: Secondary | ICD-10-CM | POA: Diagnosis not present

## 2018-10-27 DIAGNOSIS — L97412 Non-pressure chronic ulcer of right heel and midfoot with fat layer exposed: Secondary | ICD-10-CM | POA: Diagnosis not present

## 2018-10-28 DIAGNOSIS — L0390 Cellulitis, unspecified: Secondary | ICD-10-CM | POA: Diagnosis not present

## 2018-11-01 DIAGNOSIS — Z87891 Personal history of nicotine dependence: Secondary | ICD-10-CM | POA: Diagnosis not present

## 2018-11-01 DIAGNOSIS — E11621 Type 2 diabetes mellitus with foot ulcer: Secondary | ICD-10-CM | POA: Diagnosis not present

## 2018-11-01 DIAGNOSIS — L97412 Non-pressure chronic ulcer of right heel and midfoot with fat layer exposed: Secondary | ICD-10-CM | POA: Diagnosis not present

## 2018-11-01 DIAGNOSIS — L97522 Non-pressure chronic ulcer of other part of left foot with fat layer exposed: Secondary | ICD-10-CM | POA: Diagnosis not present

## 2018-11-01 DIAGNOSIS — L84 Corns and callosities: Secondary | ICD-10-CM | POA: Diagnosis not present

## 2018-11-08 DIAGNOSIS — L97412 Non-pressure chronic ulcer of right heel and midfoot with fat layer exposed: Secondary | ICD-10-CM | POA: Diagnosis not present

## 2018-11-08 DIAGNOSIS — Z87891 Personal history of nicotine dependence: Secondary | ICD-10-CM | POA: Diagnosis not present

## 2018-11-08 DIAGNOSIS — E11621 Type 2 diabetes mellitus with foot ulcer: Secondary | ICD-10-CM | POA: Diagnosis not present

## 2018-11-08 DIAGNOSIS — L97522 Non-pressure chronic ulcer of other part of left foot with fat layer exposed: Secondary | ICD-10-CM | POA: Diagnosis not present

## 2018-11-09 DIAGNOSIS — I10 Essential (primary) hypertension: Secondary | ICD-10-CM | POA: Diagnosis not present

## 2018-11-09 DIAGNOSIS — E1140 Type 2 diabetes mellitus with diabetic neuropathy, unspecified: Secondary | ICD-10-CM | POA: Diagnosis not present

## 2018-11-09 DIAGNOSIS — J449 Chronic obstructive pulmonary disease, unspecified: Secondary | ICD-10-CM | POA: Diagnosis not present

## 2018-11-09 DIAGNOSIS — E7800 Pure hypercholesterolemia, unspecified: Secondary | ICD-10-CM | POA: Diagnosis not present

## 2018-11-09 DIAGNOSIS — E559 Vitamin D deficiency, unspecified: Secondary | ICD-10-CM | POA: Diagnosis not present

## 2018-11-09 DIAGNOSIS — K219 Gastro-esophageal reflux disease without esophagitis: Secondary | ICD-10-CM | POA: Diagnosis not present

## 2018-11-09 DIAGNOSIS — E612 Magnesium deficiency: Secondary | ICD-10-CM | POA: Diagnosis not present

## 2018-11-11 DIAGNOSIS — K219 Gastro-esophageal reflux disease without esophagitis: Secondary | ICD-10-CM | POA: Diagnosis not present

## 2018-11-11 DIAGNOSIS — J449 Chronic obstructive pulmonary disease, unspecified: Secondary | ICD-10-CM | POA: Diagnosis not present

## 2018-11-11 DIAGNOSIS — E7800 Pure hypercholesterolemia, unspecified: Secondary | ICD-10-CM | POA: Diagnosis not present

## 2018-11-11 DIAGNOSIS — I10 Essential (primary) hypertension: Secondary | ICD-10-CM | POA: Diagnosis not present

## 2018-11-11 DIAGNOSIS — D649 Anemia, unspecified: Secondary | ICD-10-CM | POA: Diagnosis not present

## 2018-11-11 DIAGNOSIS — Z794 Long term (current) use of insulin: Secondary | ICD-10-CM | POA: Diagnosis not present

## 2018-11-11 DIAGNOSIS — E1142 Type 2 diabetes mellitus with diabetic polyneuropathy: Secondary | ICD-10-CM | POA: Diagnosis not present

## 2018-11-11 DIAGNOSIS — E11621 Type 2 diabetes mellitus with foot ulcer: Secondary | ICD-10-CM | POA: Diagnosis not present

## 2018-11-11 DIAGNOSIS — E612 Magnesium deficiency: Secondary | ICD-10-CM | POA: Diagnosis not present

## 2018-11-11 DIAGNOSIS — L97412 Non-pressure chronic ulcer of right heel and midfoot with fat layer exposed: Secondary | ICD-10-CM | POA: Diagnosis not present

## 2018-11-11 DIAGNOSIS — Z9181 History of falling: Secondary | ICD-10-CM | POA: Diagnosis not present

## 2018-11-11 DIAGNOSIS — L97522 Non-pressure chronic ulcer of other part of left foot with fat layer exposed: Secondary | ICD-10-CM | POA: Diagnosis not present

## 2018-11-11 DIAGNOSIS — Z9981 Dependence on supplemental oxygen: Secondary | ICD-10-CM | POA: Diagnosis not present

## 2018-11-11 DIAGNOSIS — E559 Vitamin D deficiency, unspecified: Secondary | ICD-10-CM | POA: Diagnosis not present

## 2018-11-11 DIAGNOSIS — E1165 Type 2 diabetes mellitus with hyperglycemia: Secondary | ICD-10-CM | POA: Diagnosis not present

## 2018-11-11 DIAGNOSIS — Z79891 Long term (current) use of opiate analgesic: Secondary | ICD-10-CM | POA: Diagnosis not present

## 2018-11-15 DIAGNOSIS — L97412 Non-pressure chronic ulcer of right heel and midfoot with fat layer exposed: Secondary | ICD-10-CM | POA: Diagnosis not present

## 2018-11-15 DIAGNOSIS — Z87891 Personal history of nicotine dependence: Secondary | ICD-10-CM | POA: Diagnosis not present

## 2018-11-15 DIAGNOSIS — L97522 Non-pressure chronic ulcer of other part of left foot with fat layer exposed: Secondary | ICD-10-CM | POA: Diagnosis not present

## 2018-11-15 DIAGNOSIS — E11621 Type 2 diabetes mellitus with foot ulcer: Secondary | ICD-10-CM | POA: Diagnosis not present

## 2018-11-18 DIAGNOSIS — Z79891 Long term (current) use of opiate analgesic: Secondary | ICD-10-CM | POA: Diagnosis not present

## 2018-11-18 DIAGNOSIS — E11621 Type 2 diabetes mellitus with foot ulcer: Secondary | ICD-10-CM | POA: Diagnosis not present

## 2018-11-18 DIAGNOSIS — D649 Anemia, unspecified: Secondary | ICD-10-CM | POA: Diagnosis not present

## 2018-11-18 DIAGNOSIS — I10 Essential (primary) hypertension: Secondary | ICD-10-CM | POA: Diagnosis not present

## 2018-11-18 DIAGNOSIS — L97522 Non-pressure chronic ulcer of other part of left foot with fat layer exposed: Secondary | ICD-10-CM | POA: Diagnosis not present

## 2018-11-18 DIAGNOSIS — Z9181 History of falling: Secondary | ICD-10-CM | POA: Diagnosis not present

## 2018-11-18 DIAGNOSIS — Z9981 Dependence on supplemental oxygen: Secondary | ICD-10-CM | POA: Diagnosis not present

## 2018-11-18 DIAGNOSIS — L97412 Non-pressure chronic ulcer of right heel and midfoot with fat layer exposed: Secondary | ICD-10-CM | POA: Diagnosis not present

## 2018-11-18 DIAGNOSIS — E1165 Type 2 diabetes mellitus with hyperglycemia: Secondary | ICD-10-CM | POA: Diagnosis not present

## 2018-11-18 DIAGNOSIS — J449 Chronic obstructive pulmonary disease, unspecified: Secondary | ICD-10-CM | POA: Diagnosis not present

## 2018-11-18 DIAGNOSIS — K219 Gastro-esophageal reflux disease without esophagitis: Secondary | ICD-10-CM | POA: Diagnosis not present

## 2018-11-18 DIAGNOSIS — E7800 Pure hypercholesterolemia, unspecified: Secondary | ICD-10-CM | POA: Diagnosis not present

## 2018-11-18 DIAGNOSIS — E1142 Type 2 diabetes mellitus with diabetic polyneuropathy: Secondary | ICD-10-CM | POA: Diagnosis not present

## 2018-11-18 DIAGNOSIS — E559 Vitamin D deficiency, unspecified: Secondary | ICD-10-CM | POA: Diagnosis not present

## 2018-11-18 DIAGNOSIS — Z794 Long term (current) use of insulin: Secondary | ICD-10-CM | POA: Diagnosis not present

## 2018-11-18 DIAGNOSIS — E612 Magnesium deficiency: Secondary | ICD-10-CM | POA: Diagnosis not present

## 2018-11-22 DIAGNOSIS — Z87891 Personal history of nicotine dependence: Secondary | ICD-10-CM | POA: Diagnosis not present

## 2018-11-22 DIAGNOSIS — L97522 Non-pressure chronic ulcer of other part of left foot with fat layer exposed: Secondary | ICD-10-CM | POA: Diagnosis not present

## 2018-11-22 DIAGNOSIS — J449 Chronic obstructive pulmonary disease, unspecified: Secondary | ICD-10-CM | POA: Diagnosis not present

## 2018-11-22 DIAGNOSIS — L97412 Non-pressure chronic ulcer of right heel and midfoot with fat layer exposed: Secondary | ICD-10-CM | POA: Diagnosis not present

## 2018-11-22 DIAGNOSIS — E11621 Type 2 diabetes mellitus with foot ulcer: Secondary | ICD-10-CM | POA: Diagnosis not present

## 2018-11-23 DIAGNOSIS — M5136 Other intervertebral disc degeneration, lumbar region: Secondary | ICD-10-CM | POA: Diagnosis not present

## 2018-11-23 DIAGNOSIS — E119 Type 2 diabetes mellitus without complications: Secondary | ICD-10-CM | POA: Diagnosis not present

## 2018-11-23 DIAGNOSIS — G894 Chronic pain syndrome: Secondary | ICD-10-CM | POA: Diagnosis not present

## 2018-11-25 DIAGNOSIS — E7800 Pure hypercholesterolemia, unspecified: Secondary | ICD-10-CM | POA: Diagnosis not present

## 2018-11-25 DIAGNOSIS — Z9181 History of falling: Secondary | ICD-10-CM | POA: Diagnosis not present

## 2018-11-25 DIAGNOSIS — L97412 Non-pressure chronic ulcer of right heel and midfoot with fat layer exposed: Secondary | ICD-10-CM | POA: Diagnosis not present

## 2018-11-25 DIAGNOSIS — E559 Vitamin D deficiency, unspecified: Secondary | ICD-10-CM | POA: Diagnosis not present

## 2018-11-25 DIAGNOSIS — D649 Anemia, unspecified: Secondary | ICD-10-CM | POA: Diagnosis not present

## 2018-11-25 DIAGNOSIS — E1165 Type 2 diabetes mellitus with hyperglycemia: Secondary | ICD-10-CM | POA: Diagnosis not present

## 2018-11-25 DIAGNOSIS — E1142 Type 2 diabetes mellitus with diabetic polyneuropathy: Secondary | ICD-10-CM | POA: Diagnosis not present

## 2018-11-25 DIAGNOSIS — Z9981 Dependence on supplemental oxygen: Secondary | ICD-10-CM | POA: Diagnosis not present

## 2018-11-25 DIAGNOSIS — E612 Magnesium deficiency: Secondary | ICD-10-CM | POA: Diagnosis not present

## 2018-11-25 DIAGNOSIS — E11621 Type 2 diabetes mellitus with foot ulcer: Secondary | ICD-10-CM | POA: Diagnosis not present

## 2018-11-25 DIAGNOSIS — K219 Gastro-esophageal reflux disease without esophagitis: Secondary | ICD-10-CM | POA: Diagnosis not present

## 2018-11-25 DIAGNOSIS — Z794 Long term (current) use of insulin: Secondary | ICD-10-CM | POA: Diagnosis not present

## 2018-11-25 DIAGNOSIS — I10 Essential (primary) hypertension: Secondary | ICD-10-CM | POA: Diagnosis not present

## 2018-11-25 DIAGNOSIS — J449 Chronic obstructive pulmonary disease, unspecified: Secondary | ICD-10-CM | POA: Diagnosis not present

## 2018-11-25 DIAGNOSIS — Z79891 Long term (current) use of opiate analgesic: Secondary | ICD-10-CM | POA: Diagnosis not present

## 2018-11-25 DIAGNOSIS — L97522 Non-pressure chronic ulcer of other part of left foot with fat layer exposed: Secondary | ICD-10-CM | POA: Diagnosis not present

## 2018-11-28 DIAGNOSIS — L0390 Cellulitis, unspecified: Secondary | ICD-10-CM | POA: Diagnosis not present

## 2018-11-29 DIAGNOSIS — L97522 Non-pressure chronic ulcer of other part of left foot with fat layer exposed: Secondary | ICD-10-CM | POA: Diagnosis not present

## 2018-11-29 DIAGNOSIS — L97412 Non-pressure chronic ulcer of right heel and midfoot with fat layer exposed: Secondary | ICD-10-CM | POA: Diagnosis not present

## 2018-11-29 DIAGNOSIS — E11621 Type 2 diabetes mellitus with foot ulcer: Secondary | ICD-10-CM | POA: Diagnosis not present

## 2018-11-29 DIAGNOSIS — Z87891 Personal history of nicotine dependence: Secondary | ICD-10-CM | POA: Diagnosis not present

## 2018-12-01 DIAGNOSIS — Z9181 History of falling: Secondary | ICD-10-CM | POA: Diagnosis not present

## 2018-12-01 DIAGNOSIS — Z794 Long term (current) use of insulin: Secondary | ICD-10-CM | POA: Diagnosis not present

## 2018-12-01 DIAGNOSIS — E559 Vitamin D deficiency, unspecified: Secondary | ICD-10-CM | POA: Diagnosis not present

## 2018-12-01 DIAGNOSIS — E7800 Pure hypercholesterolemia, unspecified: Secondary | ICD-10-CM | POA: Diagnosis not present

## 2018-12-01 DIAGNOSIS — E612 Magnesium deficiency: Secondary | ICD-10-CM | POA: Diagnosis not present

## 2018-12-01 DIAGNOSIS — L97412 Non-pressure chronic ulcer of right heel and midfoot with fat layer exposed: Secondary | ICD-10-CM | POA: Diagnosis not present

## 2018-12-01 DIAGNOSIS — E1165 Type 2 diabetes mellitus with hyperglycemia: Secondary | ICD-10-CM | POA: Diagnosis not present

## 2018-12-01 DIAGNOSIS — D649 Anemia, unspecified: Secondary | ICD-10-CM | POA: Diagnosis not present

## 2018-12-01 DIAGNOSIS — Z9981 Dependence on supplemental oxygen: Secondary | ICD-10-CM | POA: Diagnosis not present

## 2018-12-01 DIAGNOSIS — E11621 Type 2 diabetes mellitus with foot ulcer: Secondary | ICD-10-CM | POA: Diagnosis not present

## 2018-12-01 DIAGNOSIS — K219 Gastro-esophageal reflux disease without esophagitis: Secondary | ICD-10-CM | POA: Diagnosis not present

## 2018-12-01 DIAGNOSIS — E1142 Type 2 diabetes mellitus with diabetic polyneuropathy: Secondary | ICD-10-CM | POA: Diagnosis not present

## 2018-12-01 DIAGNOSIS — L97522 Non-pressure chronic ulcer of other part of left foot with fat layer exposed: Secondary | ICD-10-CM | POA: Diagnosis not present

## 2018-12-01 DIAGNOSIS — J449 Chronic obstructive pulmonary disease, unspecified: Secondary | ICD-10-CM | POA: Diagnosis not present

## 2018-12-01 DIAGNOSIS — Z79891 Long term (current) use of opiate analgesic: Secondary | ICD-10-CM | POA: Diagnosis not present

## 2018-12-01 DIAGNOSIS — I10 Essential (primary) hypertension: Secondary | ICD-10-CM | POA: Diagnosis not present

## 2018-12-02 DIAGNOSIS — Z9181 History of falling: Secondary | ICD-10-CM | POA: Diagnosis not present

## 2018-12-02 DIAGNOSIS — D649 Anemia, unspecified: Secondary | ICD-10-CM | POA: Diagnosis not present

## 2018-12-02 DIAGNOSIS — E559 Vitamin D deficiency, unspecified: Secondary | ICD-10-CM | POA: Diagnosis not present

## 2018-12-02 DIAGNOSIS — Z79891 Long term (current) use of opiate analgesic: Secondary | ICD-10-CM | POA: Diagnosis not present

## 2018-12-02 DIAGNOSIS — J449 Chronic obstructive pulmonary disease, unspecified: Secondary | ICD-10-CM | POA: Diagnosis not present

## 2018-12-02 DIAGNOSIS — L97412 Non-pressure chronic ulcer of right heel and midfoot with fat layer exposed: Secondary | ICD-10-CM | POA: Diagnosis not present

## 2018-12-02 DIAGNOSIS — K219 Gastro-esophageal reflux disease without esophagitis: Secondary | ICD-10-CM | POA: Diagnosis not present

## 2018-12-02 DIAGNOSIS — E1165 Type 2 diabetes mellitus with hyperglycemia: Secondary | ICD-10-CM | POA: Diagnosis not present

## 2018-12-02 DIAGNOSIS — E612 Magnesium deficiency: Secondary | ICD-10-CM | POA: Diagnosis not present

## 2018-12-02 DIAGNOSIS — Z794 Long term (current) use of insulin: Secondary | ICD-10-CM | POA: Diagnosis not present

## 2018-12-02 DIAGNOSIS — M545 Low back pain: Secondary | ICD-10-CM | POA: Diagnosis not present

## 2018-12-02 DIAGNOSIS — Z9981 Dependence on supplemental oxygen: Secondary | ICD-10-CM | POA: Diagnosis not present

## 2018-12-02 DIAGNOSIS — M47816 Spondylosis without myelopathy or radiculopathy, lumbar region: Secondary | ICD-10-CM | POA: Diagnosis not present

## 2018-12-02 DIAGNOSIS — L97522 Non-pressure chronic ulcer of other part of left foot with fat layer exposed: Secondary | ICD-10-CM | POA: Diagnosis not present

## 2018-12-02 DIAGNOSIS — E7800 Pure hypercholesterolemia, unspecified: Secondary | ICD-10-CM | POA: Diagnosis not present

## 2018-12-02 DIAGNOSIS — E11621 Type 2 diabetes mellitus with foot ulcer: Secondary | ICD-10-CM | POA: Diagnosis not present

## 2018-12-02 DIAGNOSIS — I10 Essential (primary) hypertension: Secondary | ICD-10-CM | POA: Diagnosis not present

## 2018-12-02 DIAGNOSIS — E1142 Type 2 diabetes mellitus with diabetic polyneuropathy: Secondary | ICD-10-CM | POA: Diagnosis not present

## 2018-12-06 DIAGNOSIS — Z79891 Long term (current) use of opiate analgesic: Secondary | ICD-10-CM | POA: Diagnosis not present

## 2018-12-06 DIAGNOSIS — J449 Chronic obstructive pulmonary disease, unspecified: Secondary | ICD-10-CM | POA: Diagnosis not present

## 2018-12-06 DIAGNOSIS — K219 Gastro-esophageal reflux disease without esophagitis: Secondary | ICD-10-CM | POA: Diagnosis not present

## 2018-12-06 DIAGNOSIS — Z794 Long term (current) use of insulin: Secondary | ICD-10-CM | POA: Diagnosis not present

## 2018-12-06 DIAGNOSIS — D649 Anemia, unspecified: Secondary | ICD-10-CM | POA: Diagnosis not present

## 2018-12-06 DIAGNOSIS — E1142 Type 2 diabetes mellitus with diabetic polyneuropathy: Secondary | ICD-10-CM | POA: Diagnosis not present

## 2018-12-06 DIAGNOSIS — I10 Essential (primary) hypertension: Secondary | ICD-10-CM | POA: Diagnosis not present

## 2018-12-06 DIAGNOSIS — E11621 Type 2 diabetes mellitus with foot ulcer: Secondary | ICD-10-CM | POA: Diagnosis not present

## 2018-12-06 DIAGNOSIS — L97412 Non-pressure chronic ulcer of right heel and midfoot with fat layer exposed: Secondary | ICD-10-CM | POA: Diagnosis not present

## 2018-12-06 DIAGNOSIS — Z9181 History of falling: Secondary | ICD-10-CM | POA: Diagnosis not present

## 2018-12-06 DIAGNOSIS — E559 Vitamin D deficiency, unspecified: Secondary | ICD-10-CM | POA: Diagnosis not present

## 2018-12-06 DIAGNOSIS — L97522 Non-pressure chronic ulcer of other part of left foot with fat layer exposed: Secondary | ICD-10-CM | POA: Diagnosis not present

## 2018-12-06 DIAGNOSIS — E7800 Pure hypercholesterolemia, unspecified: Secondary | ICD-10-CM | POA: Diagnosis not present

## 2018-12-06 DIAGNOSIS — E1165 Type 2 diabetes mellitus with hyperglycemia: Secondary | ICD-10-CM | POA: Diagnosis not present

## 2018-12-06 DIAGNOSIS — E612 Magnesium deficiency: Secondary | ICD-10-CM | POA: Diagnosis not present

## 2018-12-06 DIAGNOSIS — Z9981 Dependence on supplemental oxygen: Secondary | ICD-10-CM | POA: Diagnosis not present

## 2018-12-09 DIAGNOSIS — E11621 Type 2 diabetes mellitus with foot ulcer: Secondary | ICD-10-CM | POA: Diagnosis not present

## 2018-12-09 DIAGNOSIS — E559 Vitamin D deficiency, unspecified: Secondary | ICD-10-CM | POA: Diagnosis not present

## 2018-12-09 DIAGNOSIS — Z794 Long term (current) use of insulin: Secondary | ICD-10-CM | POA: Diagnosis not present

## 2018-12-09 DIAGNOSIS — E612 Magnesium deficiency: Secondary | ICD-10-CM | POA: Diagnosis not present

## 2018-12-09 DIAGNOSIS — J449 Chronic obstructive pulmonary disease, unspecified: Secondary | ICD-10-CM | POA: Diagnosis not present

## 2018-12-09 DIAGNOSIS — Z9981 Dependence on supplemental oxygen: Secondary | ICD-10-CM | POA: Diagnosis not present

## 2018-12-09 DIAGNOSIS — I10 Essential (primary) hypertension: Secondary | ICD-10-CM | POA: Diagnosis not present

## 2018-12-09 DIAGNOSIS — K219 Gastro-esophageal reflux disease without esophagitis: Secondary | ICD-10-CM | POA: Diagnosis not present

## 2018-12-09 DIAGNOSIS — L97522 Non-pressure chronic ulcer of other part of left foot with fat layer exposed: Secondary | ICD-10-CM | POA: Diagnosis not present

## 2018-12-09 DIAGNOSIS — E7800 Pure hypercholesterolemia, unspecified: Secondary | ICD-10-CM | POA: Diagnosis not present

## 2018-12-09 DIAGNOSIS — E1142 Type 2 diabetes mellitus with diabetic polyneuropathy: Secondary | ICD-10-CM | POA: Diagnosis not present

## 2018-12-09 DIAGNOSIS — E1165 Type 2 diabetes mellitus with hyperglycemia: Secondary | ICD-10-CM | POA: Diagnosis not present

## 2018-12-09 DIAGNOSIS — L97412 Non-pressure chronic ulcer of right heel and midfoot with fat layer exposed: Secondary | ICD-10-CM | POA: Diagnosis not present

## 2018-12-09 DIAGNOSIS — Z9181 History of falling: Secondary | ICD-10-CM | POA: Diagnosis not present

## 2018-12-09 DIAGNOSIS — D649 Anemia, unspecified: Secondary | ICD-10-CM | POA: Diagnosis not present

## 2018-12-09 DIAGNOSIS — Z79891 Long term (current) use of opiate analgesic: Secondary | ICD-10-CM | POA: Diagnosis not present

## 2018-12-13 DIAGNOSIS — E11621 Type 2 diabetes mellitus with foot ulcer: Secondary | ICD-10-CM | POA: Diagnosis not present

## 2018-12-13 DIAGNOSIS — Z87891 Personal history of nicotine dependence: Secondary | ICD-10-CM | POA: Diagnosis not present

## 2018-12-13 DIAGNOSIS — L97522 Non-pressure chronic ulcer of other part of left foot with fat layer exposed: Secondary | ICD-10-CM | POA: Diagnosis not present

## 2018-12-13 DIAGNOSIS — L97412 Non-pressure chronic ulcer of right heel and midfoot with fat layer exposed: Secondary | ICD-10-CM | POA: Diagnosis not present

## 2018-12-16 DIAGNOSIS — K219 Gastro-esophageal reflux disease without esophagitis: Secondary | ICD-10-CM | POA: Diagnosis not present

## 2018-12-16 DIAGNOSIS — L97522 Non-pressure chronic ulcer of other part of left foot with fat layer exposed: Secondary | ICD-10-CM | POA: Diagnosis not present

## 2018-12-16 DIAGNOSIS — I10 Essential (primary) hypertension: Secondary | ICD-10-CM | POA: Diagnosis not present

## 2018-12-16 DIAGNOSIS — Z794 Long term (current) use of insulin: Secondary | ICD-10-CM | POA: Diagnosis not present

## 2018-12-16 DIAGNOSIS — E7800 Pure hypercholesterolemia, unspecified: Secondary | ICD-10-CM | POA: Diagnosis not present

## 2018-12-16 DIAGNOSIS — D649 Anemia, unspecified: Secondary | ICD-10-CM | POA: Diagnosis not present

## 2018-12-16 DIAGNOSIS — E1142 Type 2 diabetes mellitus with diabetic polyneuropathy: Secondary | ICD-10-CM | POA: Diagnosis not present

## 2018-12-16 DIAGNOSIS — E612 Magnesium deficiency: Secondary | ICD-10-CM | POA: Diagnosis not present

## 2018-12-16 DIAGNOSIS — L97412 Non-pressure chronic ulcer of right heel and midfoot with fat layer exposed: Secondary | ICD-10-CM | POA: Diagnosis not present

## 2018-12-16 DIAGNOSIS — Z79891 Long term (current) use of opiate analgesic: Secondary | ICD-10-CM | POA: Diagnosis not present

## 2018-12-16 DIAGNOSIS — E11621 Type 2 diabetes mellitus with foot ulcer: Secondary | ICD-10-CM | POA: Diagnosis not present

## 2018-12-16 DIAGNOSIS — E559 Vitamin D deficiency, unspecified: Secondary | ICD-10-CM | POA: Diagnosis not present

## 2018-12-16 DIAGNOSIS — Z9981 Dependence on supplemental oxygen: Secondary | ICD-10-CM | POA: Diagnosis not present

## 2018-12-16 DIAGNOSIS — Z9181 History of falling: Secondary | ICD-10-CM | POA: Diagnosis not present

## 2018-12-16 DIAGNOSIS — E1165 Type 2 diabetes mellitus with hyperglycemia: Secondary | ICD-10-CM | POA: Diagnosis not present

## 2018-12-16 DIAGNOSIS — J449 Chronic obstructive pulmonary disease, unspecified: Secondary | ICD-10-CM | POA: Diagnosis not present

## 2018-12-19 DIAGNOSIS — E119 Type 2 diabetes mellitus without complications: Secondary | ICD-10-CM | POA: Diagnosis not present

## 2018-12-19 DIAGNOSIS — M47816 Spondylosis without myelopathy or radiculopathy, lumbar region: Secondary | ICD-10-CM | POA: Diagnosis not present

## 2018-12-19 DIAGNOSIS — G894 Chronic pain syndrome: Secondary | ICD-10-CM | POA: Diagnosis not present

## 2018-12-19 DIAGNOSIS — M461 Sacroiliitis, not elsewhere classified: Secondary | ICD-10-CM | POA: Diagnosis not present

## 2018-12-20 DIAGNOSIS — E11621 Type 2 diabetes mellitus with foot ulcer: Secondary | ICD-10-CM | POA: Diagnosis not present

## 2018-12-20 DIAGNOSIS — L97522 Non-pressure chronic ulcer of other part of left foot with fat layer exposed: Secondary | ICD-10-CM | POA: Diagnosis not present

## 2018-12-20 DIAGNOSIS — Z87891 Personal history of nicotine dependence: Secondary | ICD-10-CM | POA: Diagnosis not present

## 2018-12-20 DIAGNOSIS — L97412 Non-pressure chronic ulcer of right heel and midfoot with fat layer exposed: Secondary | ICD-10-CM | POA: Diagnosis not present

## 2018-12-21 DIAGNOSIS — M47816 Spondylosis without myelopathy or radiculopathy, lumbar region: Secondary | ICD-10-CM | POA: Diagnosis not present

## 2018-12-21 DIAGNOSIS — J449 Chronic obstructive pulmonary disease, unspecified: Secondary | ICD-10-CM | POA: Diagnosis not present

## 2018-12-21 DIAGNOSIS — E119 Type 2 diabetes mellitus without complications: Secondary | ICD-10-CM | POA: Diagnosis not present

## 2018-12-23 DIAGNOSIS — Z9981 Dependence on supplemental oxygen: Secondary | ICD-10-CM | POA: Diagnosis not present

## 2018-12-23 DIAGNOSIS — E11621 Type 2 diabetes mellitus with foot ulcer: Secondary | ICD-10-CM | POA: Diagnosis not present

## 2018-12-23 DIAGNOSIS — Z794 Long term (current) use of insulin: Secondary | ICD-10-CM | POA: Diagnosis not present

## 2018-12-23 DIAGNOSIS — L97412 Non-pressure chronic ulcer of right heel and midfoot with fat layer exposed: Secondary | ICD-10-CM | POA: Diagnosis not present

## 2018-12-23 DIAGNOSIS — J449 Chronic obstructive pulmonary disease, unspecified: Secondary | ICD-10-CM | POA: Diagnosis not present

## 2018-12-23 DIAGNOSIS — E1142 Type 2 diabetes mellitus with diabetic polyneuropathy: Secondary | ICD-10-CM | POA: Diagnosis not present

## 2018-12-23 DIAGNOSIS — E559 Vitamin D deficiency, unspecified: Secondary | ICD-10-CM | POA: Diagnosis not present

## 2018-12-23 DIAGNOSIS — E7800 Pure hypercholesterolemia, unspecified: Secondary | ICD-10-CM | POA: Diagnosis not present

## 2018-12-23 DIAGNOSIS — K219 Gastro-esophageal reflux disease without esophagitis: Secondary | ICD-10-CM | POA: Diagnosis not present

## 2018-12-23 DIAGNOSIS — D649 Anemia, unspecified: Secondary | ICD-10-CM | POA: Diagnosis not present

## 2018-12-23 DIAGNOSIS — E1165 Type 2 diabetes mellitus with hyperglycemia: Secondary | ICD-10-CM | POA: Diagnosis not present

## 2018-12-23 DIAGNOSIS — L97522 Non-pressure chronic ulcer of other part of left foot with fat layer exposed: Secondary | ICD-10-CM | POA: Diagnosis not present

## 2018-12-23 DIAGNOSIS — I10 Essential (primary) hypertension: Secondary | ICD-10-CM | POA: Diagnosis not present

## 2018-12-23 DIAGNOSIS — E612 Magnesium deficiency: Secondary | ICD-10-CM | POA: Diagnosis not present

## 2018-12-23 DIAGNOSIS — Z9181 History of falling: Secondary | ICD-10-CM | POA: Diagnosis not present

## 2018-12-23 DIAGNOSIS — Z79891 Long term (current) use of opiate analgesic: Secondary | ICD-10-CM | POA: Diagnosis not present

## 2018-12-27 DIAGNOSIS — E11621 Type 2 diabetes mellitus with foot ulcer: Secondary | ICD-10-CM | POA: Diagnosis not present

## 2018-12-27 DIAGNOSIS — L97412 Non-pressure chronic ulcer of right heel and midfoot with fat layer exposed: Secondary | ICD-10-CM | POA: Diagnosis not present

## 2018-12-27 DIAGNOSIS — L0390 Cellulitis, unspecified: Secondary | ICD-10-CM | POA: Diagnosis not present

## 2018-12-27 DIAGNOSIS — L97522 Non-pressure chronic ulcer of other part of left foot with fat layer exposed: Secondary | ICD-10-CM | POA: Diagnosis not present

## 2018-12-30 DIAGNOSIS — E1165 Type 2 diabetes mellitus with hyperglycemia: Secondary | ICD-10-CM | POA: Diagnosis not present

## 2018-12-30 DIAGNOSIS — Z794 Long term (current) use of insulin: Secondary | ICD-10-CM | POA: Diagnosis not present

## 2018-12-30 DIAGNOSIS — L97412 Non-pressure chronic ulcer of right heel and midfoot with fat layer exposed: Secondary | ICD-10-CM | POA: Diagnosis not present

## 2018-12-30 DIAGNOSIS — E1142 Type 2 diabetes mellitus with diabetic polyneuropathy: Secondary | ICD-10-CM | POA: Diagnosis not present

## 2018-12-30 DIAGNOSIS — Z9181 History of falling: Secondary | ICD-10-CM | POA: Diagnosis not present

## 2018-12-30 DIAGNOSIS — Z9981 Dependence on supplemental oxygen: Secondary | ICD-10-CM | POA: Diagnosis not present

## 2018-12-30 DIAGNOSIS — E612 Magnesium deficiency: Secondary | ICD-10-CM | POA: Diagnosis not present

## 2018-12-30 DIAGNOSIS — Z79891 Long term (current) use of opiate analgesic: Secondary | ICD-10-CM | POA: Diagnosis not present

## 2018-12-30 DIAGNOSIS — L97522 Non-pressure chronic ulcer of other part of left foot with fat layer exposed: Secondary | ICD-10-CM | POA: Diagnosis not present

## 2018-12-30 DIAGNOSIS — K219 Gastro-esophageal reflux disease without esophagitis: Secondary | ICD-10-CM | POA: Diagnosis not present

## 2018-12-30 DIAGNOSIS — I10 Essential (primary) hypertension: Secondary | ICD-10-CM | POA: Diagnosis not present

## 2018-12-30 DIAGNOSIS — E7800 Pure hypercholesterolemia, unspecified: Secondary | ICD-10-CM | POA: Diagnosis not present

## 2018-12-30 DIAGNOSIS — J449 Chronic obstructive pulmonary disease, unspecified: Secondary | ICD-10-CM | POA: Diagnosis not present

## 2018-12-30 DIAGNOSIS — E559 Vitamin D deficiency, unspecified: Secondary | ICD-10-CM | POA: Diagnosis not present

## 2018-12-30 DIAGNOSIS — E11621 Type 2 diabetes mellitus with foot ulcer: Secondary | ICD-10-CM | POA: Diagnosis not present

## 2018-12-30 DIAGNOSIS — D649 Anemia, unspecified: Secondary | ICD-10-CM | POA: Diagnosis not present

## 2019-01-03 DIAGNOSIS — L97522 Non-pressure chronic ulcer of other part of left foot with fat layer exposed: Secondary | ICD-10-CM | POA: Diagnosis not present

## 2019-01-03 DIAGNOSIS — E11621 Type 2 diabetes mellitus with foot ulcer: Secondary | ICD-10-CM | POA: Diagnosis not present

## 2019-01-03 DIAGNOSIS — B9562 Methicillin resistant Staphylococcus aureus infection as the cause of diseases classified elsewhere: Secondary | ICD-10-CM | POA: Diagnosis not present

## 2019-01-03 DIAGNOSIS — L97412 Non-pressure chronic ulcer of right heel and midfoot with fat layer exposed: Secondary | ICD-10-CM | POA: Diagnosis not present

## 2019-01-04 DIAGNOSIS — L97529 Non-pressure chronic ulcer of other part of left foot with unspecified severity: Secondary | ICD-10-CM | POA: Diagnosis not present

## 2019-01-05 DIAGNOSIS — E1165 Type 2 diabetes mellitus with hyperglycemia: Secondary | ICD-10-CM | POA: Diagnosis not present

## 2019-01-05 DIAGNOSIS — D649 Anemia, unspecified: Secondary | ICD-10-CM | POA: Diagnosis not present

## 2019-01-05 DIAGNOSIS — E612 Magnesium deficiency: Secondary | ICD-10-CM | POA: Diagnosis not present

## 2019-01-05 DIAGNOSIS — Z794 Long term (current) use of insulin: Secondary | ICD-10-CM | POA: Diagnosis not present

## 2019-01-05 DIAGNOSIS — E559 Vitamin D deficiency, unspecified: Secondary | ICD-10-CM | POA: Diagnosis not present

## 2019-01-05 DIAGNOSIS — J449 Chronic obstructive pulmonary disease, unspecified: Secondary | ICD-10-CM | POA: Diagnosis not present

## 2019-01-05 DIAGNOSIS — E11621 Type 2 diabetes mellitus with foot ulcer: Secondary | ICD-10-CM | POA: Diagnosis not present

## 2019-01-05 DIAGNOSIS — Z9981 Dependence on supplemental oxygen: Secondary | ICD-10-CM | POA: Diagnosis not present

## 2019-01-05 DIAGNOSIS — E1142 Type 2 diabetes mellitus with diabetic polyneuropathy: Secondary | ICD-10-CM | POA: Diagnosis not present

## 2019-01-05 DIAGNOSIS — L97412 Non-pressure chronic ulcer of right heel and midfoot with fat layer exposed: Secondary | ICD-10-CM | POA: Diagnosis not present

## 2019-01-05 DIAGNOSIS — E7800 Pure hypercholesterolemia, unspecified: Secondary | ICD-10-CM | POA: Diagnosis not present

## 2019-01-05 DIAGNOSIS — L97522 Non-pressure chronic ulcer of other part of left foot with fat layer exposed: Secondary | ICD-10-CM | POA: Diagnosis not present

## 2019-01-05 DIAGNOSIS — Z9181 History of falling: Secondary | ICD-10-CM | POA: Diagnosis not present

## 2019-01-05 DIAGNOSIS — I10 Essential (primary) hypertension: Secondary | ICD-10-CM | POA: Diagnosis not present

## 2019-01-05 DIAGNOSIS — Z79891 Long term (current) use of opiate analgesic: Secondary | ICD-10-CM | POA: Diagnosis not present

## 2019-01-05 DIAGNOSIS — K219 Gastro-esophageal reflux disease without esophagitis: Secondary | ICD-10-CM | POA: Diagnosis not present

## 2019-01-09 DIAGNOSIS — L97529 Non-pressure chronic ulcer of other part of left foot with unspecified severity: Secondary | ICD-10-CM | POA: Diagnosis not present

## 2019-01-10 DIAGNOSIS — L97522 Non-pressure chronic ulcer of other part of left foot with fat layer exposed: Secondary | ICD-10-CM | POA: Diagnosis not present

## 2019-01-10 DIAGNOSIS — E11621 Type 2 diabetes mellitus with foot ulcer: Secondary | ICD-10-CM | POA: Diagnosis not present

## 2019-01-10 DIAGNOSIS — L97412 Non-pressure chronic ulcer of right heel and midfoot with fat layer exposed: Secondary | ICD-10-CM | POA: Diagnosis not present

## 2019-01-13 DIAGNOSIS — Z79891 Long term (current) use of opiate analgesic: Secondary | ICD-10-CM | POA: Diagnosis not present

## 2019-01-13 DIAGNOSIS — L97412 Non-pressure chronic ulcer of right heel and midfoot with fat layer exposed: Secondary | ICD-10-CM | POA: Diagnosis not present

## 2019-01-13 DIAGNOSIS — E559 Vitamin D deficiency, unspecified: Secondary | ICD-10-CM | POA: Diagnosis not present

## 2019-01-13 DIAGNOSIS — E1142 Type 2 diabetes mellitus with diabetic polyneuropathy: Secondary | ICD-10-CM | POA: Diagnosis not present

## 2019-01-13 DIAGNOSIS — K219 Gastro-esophageal reflux disease without esophagitis: Secondary | ICD-10-CM | POA: Diagnosis not present

## 2019-01-13 DIAGNOSIS — E11621 Type 2 diabetes mellitus with foot ulcer: Secondary | ICD-10-CM | POA: Diagnosis not present

## 2019-01-13 DIAGNOSIS — E1165 Type 2 diabetes mellitus with hyperglycemia: Secondary | ICD-10-CM | POA: Diagnosis not present

## 2019-01-13 DIAGNOSIS — D649 Anemia, unspecified: Secondary | ICD-10-CM | POA: Diagnosis not present

## 2019-01-13 DIAGNOSIS — Z9181 History of falling: Secondary | ICD-10-CM | POA: Diagnosis not present

## 2019-01-13 DIAGNOSIS — E7800 Pure hypercholesterolemia, unspecified: Secondary | ICD-10-CM | POA: Diagnosis not present

## 2019-01-13 DIAGNOSIS — Z9981 Dependence on supplemental oxygen: Secondary | ICD-10-CM | POA: Diagnosis not present

## 2019-01-13 DIAGNOSIS — Z794 Long term (current) use of insulin: Secondary | ICD-10-CM | POA: Diagnosis not present

## 2019-01-13 DIAGNOSIS — E612 Magnesium deficiency: Secondary | ICD-10-CM | POA: Diagnosis not present

## 2019-01-13 DIAGNOSIS — I10 Essential (primary) hypertension: Secondary | ICD-10-CM | POA: Diagnosis not present

## 2019-01-13 DIAGNOSIS — J449 Chronic obstructive pulmonary disease, unspecified: Secondary | ICD-10-CM | POA: Diagnosis not present

## 2019-01-13 DIAGNOSIS — L97522 Non-pressure chronic ulcer of other part of left foot with fat layer exposed: Secondary | ICD-10-CM | POA: Diagnosis not present

## 2019-01-17 DIAGNOSIS — L97529 Non-pressure chronic ulcer of other part of left foot with unspecified severity: Secondary | ICD-10-CM | POA: Diagnosis not present

## 2019-01-17 DIAGNOSIS — L97412 Non-pressure chronic ulcer of right heel and midfoot with fat layer exposed: Secondary | ICD-10-CM | POA: Diagnosis not present

## 2019-01-17 DIAGNOSIS — E1142 Type 2 diabetes mellitus with diabetic polyneuropathy: Secondary | ICD-10-CM | POA: Diagnosis not present

## 2019-01-17 DIAGNOSIS — Z79891 Long term (current) use of opiate analgesic: Secondary | ICD-10-CM | POA: Diagnosis not present

## 2019-01-17 DIAGNOSIS — L97522 Non-pressure chronic ulcer of other part of left foot with fat layer exposed: Secondary | ICD-10-CM | POA: Diagnosis not present

## 2019-01-17 DIAGNOSIS — K219 Gastro-esophageal reflux disease without esophagitis: Secondary | ICD-10-CM | POA: Diagnosis not present

## 2019-01-17 DIAGNOSIS — E1165 Type 2 diabetes mellitus with hyperglycemia: Secondary | ICD-10-CM | POA: Diagnosis not present

## 2019-01-17 DIAGNOSIS — E559 Vitamin D deficiency, unspecified: Secondary | ICD-10-CM | POA: Diagnosis not present

## 2019-01-17 DIAGNOSIS — Z9181 History of falling: Secondary | ICD-10-CM | POA: Diagnosis not present

## 2019-01-17 DIAGNOSIS — E11621 Type 2 diabetes mellitus with foot ulcer: Secondary | ICD-10-CM | POA: Diagnosis not present

## 2019-01-17 DIAGNOSIS — Z794 Long term (current) use of insulin: Secondary | ICD-10-CM | POA: Diagnosis not present

## 2019-01-17 DIAGNOSIS — E612 Magnesium deficiency: Secondary | ICD-10-CM | POA: Diagnosis not present

## 2019-01-17 DIAGNOSIS — J449 Chronic obstructive pulmonary disease, unspecified: Secondary | ICD-10-CM | POA: Diagnosis not present

## 2019-01-17 DIAGNOSIS — I10 Essential (primary) hypertension: Secondary | ICD-10-CM | POA: Diagnosis not present

## 2019-01-17 DIAGNOSIS — D649 Anemia, unspecified: Secondary | ICD-10-CM | POA: Diagnosis not present

## 2019-01-17 DIAGNOSIS — E7800 Pure hypercholesterolemia, unspecified: Secondary | ICD-10-CM | POA: Diagnosis not present

## 2019-01-17 DIAGNOSIS — Z9981 Dependence on supplemental oxygen: Secondary | ICD-10-CM | POA: Diagnosis not present

## 2019-01-18 DIAGNOSIS — M461 Sacroiliitis, not elsewhere classified: Secondary | ICD-10-CM | POA: Diagnosis not present

## 2019-01-18 DIAGNOSIS — M47816 Spondylosis without myelopathy or radiculopathy, lumbar region: Secondary | ICD-10-CM | POA: Diagnosis not present

## 2019-01-18 DIAGNOSIS — G894 Chronic pain syndrome: Secondary | ICD-10-CM | POA: Diagnosis not present

## 2019-01-18 DIAGNOSIS — L97529 Non-pressure chronic ulcer of other part of left foot with unspecified severity: Secondary | ICD-10-CM | POA: Diagnosis not present

## 2019-01-18 DIAGNOSIS — E119 Type 2 diabetes mellitus without complications: Secondary | ICD-10-CM | POA: Diagnosis not present

## 2019-01-19 DIAGNOSIS — L97529 Non-pressure chronic ulcer of other part of left foot with unspecified severity: Secondary | ICD-10-CM | POA: Diagnosis not present

## 2019-01-20 DIAGNOSIS — E612 Magnesium deficiency: Secondary | ICD-10-CM | POA: Diagnosis not present

## 2019-01-20 DIAGNOSIS — L97522 Non-pressure chronic ulcer of other part of left foot with fat layer exposed: Secondary | ICD-10-CM | POA: Diagnosis not present

## 2019-01-20 DIAGNOSIS — D649 Anemia, unspecified: Secondary | ICD-10-CM | POA: Diagnosis not present

## 2019-01-20 DIAGNOSIS — J449 Chronic obstructive pulmonary disease, unspecified: Secondary | ICD-10-CM | POA: Diagnosis not present

## 2019-01-20 DIAGNOSIS — L97412 Non-pressure chronic ulcer of right heel and midfoot with fat layer exposed: Secondary | ICD-10-CM | POA: Diagnosis not present

## 2019-01-20 DIAGNOSIS — Z794 Long term (current) use of insulin: Secondary | ICD-10-CM | POA: Diagnosis not present

## 2019-01-20 DIAGNOSIS — K219 Gastro-esophageal reflux disease without esophagitis: Secondary | ICD-10-CM | POA: Diagnosis not present

## 2019-01-20 DIAGNOSIS — E1142 Type 2 diabetes mellitus with diabetic polyneuropathy: Secondary | ICD-10-CM | POA: Diagnosis not present

## 2019-01-20 DIAGNOSIS — E11621 Type 2 diabetes mellitus with foot ulcer: Secondary | ICD-10-CM | POA: Diagnosis not present

## 2019-01-20 DIAGNOSIS — Z79891 Long term (current) use of opiate analgesic: Secondary | ICD-10-CM | POA: Diagnosis not present

## 2019-01-20 DIAGNOSIS — Z9981 Dependence on supplemental oxygen: Secondary | ICD-10-CM | POA: Diagnosis not present

## 2019-01-20 DIAGNOSIS — E1165 Type 2 diabetes mellitus with hyperglycemia: Secondary | ICD-10-CM | POA: Diagnosis not present

## 2019-01-20 DIAGNOSIS — I10 Essential (primary) hypertension: Secondary | ICD-10-CM | POA: Diagnosis not present

## 2019-01-20 DIAGNOSIS — E559 Vitamin D deficiency, unspecified: Secondary | ICD-10-CM | POA: Diagnosis not present

## 2019-01-20 DIAGNOSIS — E7800 Pure hypercholesterolemia, unspecified: Secondary | ICD-10-CM | POA: Diagnosis not present

## 2019-01-20 DIAGNOSIS — Z9181 History of falling: Secondary | ICD-10-CM | POA: Diagnosis not present

## 2019-01-21 DIAGNOSIS — J449 Chronic obstructive pulmonary disease, unspecified: Secondary | ICD-10-CM | POA: Diagnosis not present

## 2019-01-23 DIAGNOSIS — I10 Essential (primary) hypertension: Secondary | ICD-10-CM | POA: Diagnosis not present

## 2019-01-23 DIAGNOSIS — Z9181 History of falling: Secondary | ICD-10-CM | POA: Diagnosis not present

## 2019-01-23 DIAGNOSIS — D649 Anemia, unspecified: Secondary | ICD-10-CM | POA: Diagnosis not present

## 2019-01-23 DIAGNOSIS — E1142 Type 2 diabetes mellitus with diabetic polyneuropathy: Secondary | ICD-10-CM | POA: Diagnosis not present

## 2019-01-23 DIAGNOSIS — E612 Magnesium deficiency: Secondary | ICD-10-CM | POA: Diagnosis not present

## 2019-01-23 DIAGNOSIS — E11621 Type 2 diabetes mellitus with foot ulcer: Secondary | ICD-10-CM | POA: Diagnosis not present

## 2019-01-23 DIAGNOSIS — L97522 Non-pressure chronic ulcer of other part of left foot with fat layer exposed: Secondary | ICD-10-CM | POA: Diagnosis not present

## 2019-01-23 DIAGNOSIS — Z79891 Long term (current) use of opiate analgesic: Secondary | ICD-10-CM | POA: Diagnosis not present

## 2019-01-23 DIAGNOSIS — K219 Gastro-esophageal reflux disease without esophagitis: Secondary | ICD-10-CM | POA: Diagnosis not present

## 2019-01-23 DIAGNOSIS — Z9981 Dependence on supplemental oxygen: Secondary | ICD-10-CM | POA: Diagnosis not present

## 2019-01-23 DIAGNOSIS — Z794 Long term (current) use of insulin: Secondary | ICD-10-CM | POA: Diagnosis not present

## 2019-01-23 DIAGNOSIS — L97412 Non-pressure chronic ulcer of right heel and midfoot with fat layer exposed: Secondary | ICD-10-CM | POA: Diagnosis not present

## 2019-01-23 DIAGNOSIS — E559 Vitamin D deficiency, unspecified: Secondary | ICD-10-CM | POA: Diagnosis not present

## 2019-01-23 DIAGNOSIS — J449 Chronic obstructive pulmonary disease, unspecified: Secondary | ICD-10-CM | POA: Diagnosis not present

## 2019-01-23 DIAGNOSIS — E1165 Type 2 diabetes mellitus with hyperglycemia: Secondary | ICD-10-CM | POA: Diagnosis not present

## 2019-01-23 DIAGNOSIS — E7800 Pure hypercholesterolemia, unspecified: Secondary | ICD-10-CM | POA: Diagnosis not present

## 2019-01-24 DIAGNOSIS — E11621 Type 2 diabetes mellitus with foot ulcer: Secondary | ICD-10-CM | POA: Diagnosis not present

## 2019-01-24 DIAGNOSIS — L97412 Non-pressure chronic ulcer of right heel and midfoot with fat layer exposed: Secondary | ICD-10-CM | POA: Diagnosis not present

## 2019-01-24 DIAGNOSIS — L97522 Non-pressure chronic ulcer of other part of left foot with fat layer exposed: Secondary | ICD-10-CM | POA: Diagnosis not present

## 2019-01-24 DIAGNOSIS — Z87891 Personal history of nicotine dependence: Secondary | ICD-10-CM | POA: Diagnosis not present

## 2019-01-26 DIAGNOSIS — E612 Magnesium deficiency: Secondary | ICD-10-CM | POA: Diagnosis not present

## 2019-01-26 DIAGNOSIS — Z794 Long term (current) use of insulin: Secondary | ICD-10-CM | POA: Diagnosis not present

## 2019-01-26 DIAGNOSIS — L97412 Non-pressure chronic ulcer of right heel and midfoot with fat layer exposed: Secondary | ICD-10-CM | POA: Diagnosis not present

## 2019-01-26 DIAGNOSIS — E7800 Pure hypercholesterolemia, unspecified: Secondary | ICD-10-CM | POA: Diagnosis not present

## 2019-01-26 DIAGNOSIS — J449 Chronic obstructive pulmonary disease, unspecified: Secondary | ICD-10-CM | POA: Diagnosis not present

## 2019-01-26 DIAGNOSIS — I10 Essential (primary) hypertension: Secondary | ICD-10-CM | POA: Diagnosis not present

## 2019-01-26 DIAGNOSIS — Z9981 Dependence on supplemental oxygen: Secondary | ICD-10-CM | POA: Diagnosis not present

## 2019-01-26 DIAGNOSIS — E1142 Type 2 diabetes mellitus with diabetic polyneuropathy: Secondary | ICD-10-CM | POA: Diagnosis not present

## 2019-01-26 DIAGNOSIS — E559 Vitamin D deficiency, unspecified: Secondary | ICD-10-CM | POA: Diagnosis not present

## 2019-01-26 DIAGNOSIS — Z79891 Long term (current) use of opiate analgesic: Secondary | ICD-10-CM | POA: Diagnosis not present

## 2019-01-26 DIAGNOSIS — Z9181 History of falling: Secondary | ICD-10-CM | POA: Diagnosis not present

## 2019-01-26 DIAGNOSIS — D649 Anemia, unspecified: Secondary | ICD-10-CM | POA: Diagnosis not present

## 2019-01-26 DIAGNOSIS — E1165 Type 2 diabetes mellitus with hyperglycemia: Secondary | ICD-10-CM | POA: Diagnosis not present

## 2019-01-26 DIAGNOSIS — L97522 Non-pressure chronic ulcer of other part of left foot with fat layer exposed: Secondary | ICD-10-CM | POA: Diagnosis not present

## 2019-01-26 DIAGNOSIS — E11621 Type 2 diabetes mellitus with foot ulcer: Secondary | ICD-10-CM | POA: Diagnosis not present

## 2019-01-26 DIAGNOSIS — K219 Gastro-esophageal reflux disease without esophagitis: Secondary | ICD-10-CM | POA: Diagnosis not present

## 2019-01-27 DIAGNOSIS — L0390 Cellulitis, unspecified: Secondary | ICD-10-CM | POA: Diagnosis not present

## 2019-01-31 DIAGNOSIS — L97522 Non-pressure chronic ulcer of other part of left foot with fat layer exposed: Secondary | ICD-10-CM | POA: Diagnosis not present

## 2019-01-31 DIAGNOSIS — L97412 Non-pressure chronic ulcer of right heel and midfoot with fat layer exposed: Secondary | ICD-10-CM | POA: Diagnosis not present

## 2019-01-31 DIAGNOSIS — E11621 Type 2 diabetes mellitus with foot ulcer: Secondary | ICD-10-CM | POA: Diagnosis not present

## 2019-02-02 DIAGNOSIS — Z79891 Long term (current) use of opiate analgesic: Secondary | ICD-10-CM | POA: Diagnosis not present

## 2019-02-02 DIAGNOSIS — K219 Gastro-esophageal reflux disease without esophagitis: Secondary | ICD-10-CM | POA: Diagnosis not present

## 2019-02-02 DIAGNOSIS — I10 Essential (primary) hypertension: Secondary | ICD-10-CM | POA: Diagnosis not present

## 2019-02-02 DIAGNOSIS — E1142 Type 2 diabetes mellitus with diabetic polyneuropathy: Secondary | ICD-10-CM | POA: Diagnosis not present

## 2019-02-02 DIAGNOSIS — E612 Magnesium deficiency: Secondary | ICD-10-CM | POA: Diagnosis not present

## 2019-02-02 DIAGNOSIS — Z794 Long term (current) use of insulin: Secondary | ICD-10-CM | POA: Diagnosis not present

## 2019-02-02 DIAGNOSIS — E1165 Type 2 diabetes mellitus with hyperglycemia: Secondary | ICD-10-CM | POA: Diagnosis not present

## 2019-02-02 DIAGNOSIS — E7800 Pure hypercholesterolemia, unspecified: Secondary | ICD-10-CM | POA: Diagnosis not present

## 2019-02-02 DIAGNOSIS — D649 Anemia, unspecified: Secondary | ICD-10-CM | POA: Diagnosis not present

## 2019-02-02 DIAGNOSIS — Z9181 History of falling: Secondary | ICD-10-CM | POA: Diagnosis not present

## 2019-02-02 DIAGNOSIS — L97522 Non-pressure chronic ulcer of other part of left foot with fat layer exposed: Secondary | ICD-10-CM | POA: Diagnosis not present

## 2019-02-02 DIAGNOSIS — Z9981 Dependence on supplemental oxygen: Secondary | ICD-10-CM | POA: Diagnosis not present

## 2019-02-02 DIAGNOSIS — L97412 Non-pressure chronic ulcer of right heel and midfoot with fat layer exposed: Secondary | ICD-10-CM | POA: Diagnosis not present

## 2019-02-02 DIAGNOSIS — E11621 Type 2 diabetes mellitus with foot ulcer: Secondary | ICD-10-CM | POA: Diagnosis not present

## 2019-02-02 DIAGNOSIS — J449 Chronic obstructive pulmonary disease, unspecified: Secondary | ICD-10-CM | POA: Diagnosis not present

## 2019-02-02 DIAGNOSIS — E559 Vitamin D deficiency, unspecified: Secondary | ICD-10-CM | POA: Diagnosis not present

## 2019-02-07 DIAGNOSIS — L97522 Non-pressure chronic ulcer of other part of left foot with fat layer exposed: Secondary | ICD-10-CM | POA: Diagnosis not present

## 2019-02-07 DIAGNOSIS — L97412 Non-pressure chronic ulcer of right heel and midfoot with fat layer exposed: Secondary | ICD-10-CM | POA: Diagnosis not present

## 2019-02-07 DIAGNOSIS — L97529 Non-pressure chronic ulcer of other part of left foot with unspecified severity: Secondary | ICD-10-CM | POA: Diagnosis not present

## 2019-02-07 DIAGNOSIS — E11621 Type 2 diabetes mellitus with foot ulcer: Secondary | ICD-10-CM | POA: Diagnosis not present

## 2019-02-10 DIAGNOSIS — E1142 Type 2 diabetes mellitus with diabetic polyneuropathy: Secondary | ICD-10-CM | POA: Diagnosis not present

## 2019-02-10 DIAGNOSIS — E612 Magnesium deficiency: Secondary | ICD-10-CM | POA: Diagnosis not present

## 2019-02-10 DIAGNOSIS — E559 Vitamin D deficiency, unspecified: Secondary | ICD-10-CM | POA: Diagnosis not present

## 2019-02-10 DIAGNOSIS — E1165 Type 2 diabetes mellitus with hyperglycemia: Secondary | ICD-10-CM | POA: Diagnosis not present

## 2019-02-10 DIAGNOSIS — Z9181 History of falling: Secondary | ICD-10-CM | POA: Diagnosis not present

## 2019-02-10 DIAGNOSIS — Z79891 Long term (current) use of opiate analgesic: Secondary | ICD-10-CM | POA: Diagnosis not present

## 2019-02-10 DIAGNOSIS — I10 Essential (primary) hypertension: Secondary | ICD-10-CM | POA: Diagnosis not present

## 2019-02-10 DIAGNOSIS — Z794 Long term (current) use of insulin: Secondary | ICD-10-CM | POA: Diagnosis not present

## 2019-02-10 DIAGNOSIS — J449 Chronic obstructive pulmonary disease, unspecified: Secondary | ICD-10-CM | POA: Diagnosis not present

## 2019-02-10 DIAGNOSIS — E7800 Pure hypercholesterolemia, unspecified: Secondary | ICD-10-CM | POA: Diagnosis not present

## 2019-02-10 DIAGNOSIS — L97412 Non-pressure chronic ulcer of right heel and midfoot with fat layer exposed: Secondary | ICD-10-CM | POA: Diagnosis not present

## 2019-02-10 DIAGNOSIS — K219 Gastro-esophageal reflux disease without esophagitis: Secondary | ICD-10-CM | POA: Diagnosis not present

## 2019-02-10 DIAGNOSIS — L97522 Non-pressure chronic ulcer of other part of left foot with fat layer exposed: Secondary | ICD-10-CM | POA: Diagnosis not present

## 2019-02-10 DIAGNOSIS — Z9981 Dependence on supplemental oxygen: Secondary | ICD-10-CM | POA: Diagnosis not present

## 2019-02-10 DIAGNOSIS — D649 Anemia, unspecified: Secondary | ICD-10-CM | POA: Diagnosis not present

## 2019-02-10 DIAGNOSIS — E11621 Type 2 diabetes mellitus with foot ulcer: Secondary | ICD-10-CM | POA: Diagnosis not present

## 2019-02-13 DIAGNOSIS — E1142 Type 2 diabetes mellitus with diabetic polyneuropathy: Secondary | ICD-10-CM | POA: Diagnosis not present

## 2019-02-13 DIAGNOSIS — E1165 Type 2 diabetes mellitus with hyperglycemia: Secondary | ICD-10-CM | POA: Diagnosis not present

## 2019-02-13 DIAGNOSIS — I10 Essential (primary) hypertension: Secondary | ICD-10-CM | POA: Diagnosis not present

## 2019-02-13 DIAGNOSIS — Z79891 Long term (current) use of opiate analgesic: Secondary | ICD-10-CM | POA: Diagnosis not present

## 2019-02-13 DIAGNOSIS — E11621 Type 2 diabetes mellitus with foot ulcer: Secondary | ICD-10-CM | POA: Diagnosis not present

## 2019-02-13 DIAGNOSIS — J449 Chronic obstructive pulmonary disease, unspecified: Secondary | ICD-10-CM | POA: Diagnosis not present

## 2019-02-13 DIAGNOSIS — D649 Anemia, unspecified: Secondary | ICD-10-CM | POA: Diagnosis not present

## 2019-02-13 DIAGNOSIS — E559 Vitamin D deficiency, unspecified: Secondary | ICD-10-CM | POA: Diagnosis not present

## 2019-02-13 DIAGNOSIS — Z9981 Dependence on supplemental oxygen: Secondary | ICD-10-CM | POA: Diagnosis not present

## 2019-02-13 DIAGNOSIS — Z794 Long term (current) use of insulin: Secondary | ICD-10-CM | POA: Diagnosis not present

## 2019-02-13 DIAGNOSIS — Z9181 History of falling: Secondary | ICD-10-CM | POA: Diagnosis not present

## 2019-02-13 DIAGNOSIS — L97412 Non-pressure chronic ulcer of right heel and midfoot with fat layer exposed: Secondary | ICD-10-CM | POA: Diagnosis not present

## 2019-02-13 DIAGNOSIS — K219 Gastro-esophageal reflux disease without esophagitis: Secondary | ICD-10-CM | POA: Diagnosis not present

## 2019-02-13 DIAGNOSIS — E7800 Pure hypercholesterolemia, unspecified: Secondary | ICD-10-CM | POA: Diagnosis not present

## 2019-02-13 DIAGNOSIS — L97522 Non-pressure chronic ulcer of other part of left foot with fat layer exposed: Secondary | ICD-10-CM | POA: Diagnosis not present

## 2019-02-13 DIAGNOSIS — E612 Magnesium deficiency: Secondary | ICD-10-CM | POA: Diagnosis not present

## 2019-02-14 DIAGNOSIS — E11621 Type 2 diabetes mellitus with foot ulcer: Secondary | ICD-10-CM | POA: Diagnosis not present

## 2019-02-14 DIAGNOSIS — M461 Sacroiliitis, not elsewhere classified: Secondary | ICD-10-CM | POA: Diagnosis not present

## 2019-02-14 DIAGNOSIS — M47816 Spondylosis without myelopathy or radiculopathy, lumbar region: Secondary | ICD-10-CM | POA: Diagnosis not present

## 2019-02-14 DIAGNOSIS — G894 Chronic pain syndrome: Secondary | ICD-10-CM | POA: Diagnosis not present

## 2019-02-14 DIAGNOSIS — L97522 Non-pressure chronic ulcer of other part of left foot with fat layer exposed: Secondary | ICD-10-CM | POA: Diagnosis not present

## 2019-02-14 DIAGNOSIS — L97412 Non-pressure chronic ulcer of right heel and midfoot with fat layer exposed: Secondary | ICD-10-CM | POA: Diagnosis not present

## 2019-02-14 DIAGNOSIS — E119 Type 2 diabetes mellitus without complications: Secondary | ICD-10-CM | POA: Diagnosis not present

## 2019-02-15 DIAGNOSIS — E08621 Diabetes mellitus due to underlying condition with foot ulcer: Secondary | ICD-10-CM | POA: Diagnosis not present

## 2019-02-15 DIAGNOSIS — L97409 Non-pressure chronic ulcer of unspecified heel and midfoot with unspecified severity: Secondary | ICD-10-CM | POA: Diagnosis not present

## 2019-02-15 DIAGNOSIS — E11621 Type 2 diabetes mellitus with foot ulcer: Secondary | ICD-10-CM | POA: Diagnosis not present

## 2019-02-15 DIAGNOSIS — L97521 Non-pressure chronic ulcer of other part of left foot limited to breakdown of skin: Secondary | ICD-10-CM | POA: Diagnosis not present

## 2019-02-16 DIAGNOSIS — E11621 Type 2 diabetes mellitus with foot ulcer: Secondary | ICD-10-CM | POA: Diagnosis not present

## 2019-02-16 DIAGNOSIS — L97409 Non-pressure chronic ulcer of unspecified heel and midfoot with unspecified severity: Secondary | ICD-10-CM | POA: Diagnosis not present

## 2019-02-17 DIAGNOSIS — K219 Gastro-esophageal reflux disease without esophagitis: Secondary | ICD-10-CM | POA: Diagnosis not present

## 2019-02-17 DIAGNOSIS — I10 Essential (primary) hypertension: Secondary | ICD-10-CM | POA: Diagnosis not present

## 2019-02-17 DIAGNOSIS — Z9981 Dependence on supplemental oxygen: Secondary | ICD-10-CM | POA: Diagnosis not present

## 2019-02-17 DIAGNOSIS — E612 Magnesium deficiency: Secondary | ICD-10-CM | POA: Diagnosis not present

## 2019-02-17 DIAGNOSIS — E7800 Pure hypercholesterolemia, unspecified: Secondary | ICD-10-CM | POA: Diagnosis not present

## 2019-02-17 DIAGNOSIS — Z794 Long term (current) use of insulin: Secondary | ICD-10-CM | POA: Diagnosis not present

## 2019-02-17 DIAGNOSIS — L97412 Non-pressure chronic ulcer of right heel and midfoot with fat layer exposed: Secondary | ICD-10-CM | POA: Diagnosis not present

## 2019-02-17 DIAGNOSIS — E1165 Type 2 diabetes mellitus with hyperglycemia: Secondary | ICD-10-CM | POA: Diagnosis not present

## 2019-02-17 DIAGNOSIS — Z9181 History of falling: Secondary | ICD-10-CM | POA: Diagnosis not present

## 2019-02-17 DIAGNOSIS — Z79891 Long term (current) use of opiate analgesic: Secondary | ICD-10-CM | POA: Diagnosis not present

## 2019-02-17 DIAGNOSIS — E11621 Type 2 diabetes mellitus with foot ulcer: Secondary | ICD-10-CM | POA: Diagnosis not present

## 2019-02-17 DIAGNOSIS — E1142 Type 2 diabetes mellitus with diabetic polyneuropathy: Secondary | ICD-10-CM | POA: Diagnosis not present

## 2019-02-17 DIAGNOSIS — D649 Anemia, unspecified: Secondary | ICD-10-CM | POA: Diagnosis not present

## 2019-02-17 DIAGNOSIS — E559 Vitamin D deficiency, unspecified: Secondary | ICD-10-CM | POA: Diagnosis not present

## 2019-02-17 DIAGNOSIS — L97522 Non-pressure chronic ulcer of other part of left foot with fat layer exposed: Secondary | ICD-10-CM | POA: Diagnosis not present

## 2019-02-17 DIAGNOSIS — J449 Chronic obstructive pulmonary disease, unspecified: Secondary | ICD-10-CM | POA: Diagnosis not present

## 2019-02-20 DIAGNOSIS — J449 Chronic obstructive pulmonary disease, unspecified: Secondary | ICD-10-CM | POA: Diagnosis not present

## 2019-02-21 DIAGNOSIS — L97412 Non-pressure chronic ulcer of right heel and midfoot with fat layer exposed: Secondary | ICD-10-CM | POA: Diagnosis not present

## 2019-02-21 DIAGNOSIS — E11621 Type 2 diabetes mellitus with foot ulcer: Secondary | ICD-10-CM | POA: Diagnosis not present

## 2019-02-22 DIAGNOSIS — E11621 Type 2 diabetes mellitus with foot ulcer: Secondary | ICD-10-CM | POA: Diagnosis not present

## 2019-02-22 DIAGNOSIS — Z794 Long term (current) use of insulin: Secondary | ICD-10-CM | POA: Diagnosis not present

## 2019-02-22 DIAGNOSIS — K219 Gastro-esophageal reflux disease without esophagitis: Secondary | ICD-10-CM | POA: Diagnosis not present

## 2019-02-22 DIAGNOSIS — Z9981 Dependence on supplemental oxygen: Secondary | ICD-10-CM | POA: Diagnosis not present

## 2019-02-22 DIAGNOSIS — E612 Magnesium deficiency: Secondary | ICD-10-CM | POA: Diagnosis not present

## 2019-02-22 DIAGNOSIS — E1165 Type 2 diabetes mellitus with hyperglycemia: Secondary | ICD-10-CM | POA: Diagnosis not present

## 2019-02-22 DIAGNOSIS — E1142 Type 2 diabetes mellitus with diabetic polyneuropathy: Secondary | ICD-10-CM | POA: Diagnosis not present

## 2019-02-22 DIAGNOSIS — I10 Essential (primary) hypertension: Secondary | ICD-10-CM | POA: Diagnosis not present

## 2019-02-22 DIAGNOSIS — L97412 Non-pressure chronic ulcer of right heel and midfoot with fat layer exposed: Secondary | ICD-10-CM | POA: Diagnosis not present

## 2019-02-22 DIAGNOSIS — E08621 Diabetes mellitus due to underlying condition with foot ulcer: Secondary | ICD-10-CM | POA: Diagnosis not present

## 2019-02-22 DIAGNOSIS — Z9181 History of falling: Secondary | ICD-10-CM | POA: Diagnosis not present

## 2019-02-22 DIAGNOSIS — J449 Chronic obstructive pulmonary disease, unspecified: Secondary | ICD-10-CM | POA: Diagnosis not present

## 2019-02-22 DIAGNOSIS — L97522 Non-pressure chronic ulcer of other part of left foot with fat layer exposed: Secondary | ICD-10-CM | POA: Diagnosis not present

## 2019-02-22 DIAGNOSIS — Z79891 Long term (current) use of opiate analgesic: Secondary | ICD-10-CM | POA: Diagnosis not present

## 2019-02-22 DIAGNOSIS — E559 Vitamin D deficiency, unspecified: Secondary | ICD-10-CM | POA: Diagnosis not present

## 2019-02-22 DIAGNOSIS — L97409 Non-pressure chronic ulcer of unspecified heel and midfoot with unspecified severity: Secondary | ICD-10-CM | POA: Diagnosis not present

## 2019-02-22 DIAGNOSIS — E7800 Pure hypercholesterolemia, unspecified: Secondary | ICD-10-CM | POA: Diagnosis not present

## 2019-02-22 DIAGNOSIS — L97521 Non-pressure chronic ulcer of other part of left foot limited to breakdown of skin: Secondary | ICD-10-CM | POA: Diagnosis not present

## 2019-02-22 DIAGNOSIS — D649 Anemia, unspecified: Secondary | ICD-10-CM | POA: Diagnosis not present

## 2019-02-24 DIAGNOSIS — E11621 Type 2 diabetes mellitus with foot ulcer: Secondary | ICD-10-CM | POA: Diagnosis not present

## 2019-02-24 DIAGNOSIS — J449 Chronic obstructive pulmonary disease, unspecified: Secondary | ICD-10-CM | POA: Diagnosis not present

## 2019-02-24 DIAGNOSIS — Z79899 Other long term (current) drug therapy: Secondary | ICD-10-CM | POA: Diagnosis not present

## 2019-02-24 DIAGNOSIS — L97412 Non-pressure chronic ulcer of right heel and midfoot with fat layer exposed: Secondary | ICD-10-CM | POA: Diagnosis not present

## 2019-02-24 DIAGNOSIS — G629 Polyneuropathy, unspecified: Secondary | ICD-10-CM | POA: Diagnosis not present

## 2019-02-24 DIAGNOSIS — Z794 Long term (current) use of insulin: Secondary | ICD-10-CM | POA: Diagnosis not present

## 2019-02-24 DIAGNOSIS — Z79891 Long term (current) use of opiate analgesic: Secondary | ICD-10-CM | POA: Diagnosis not present

## 2019-02-24 DIAGNOSIS — G709 Myoneural disorder, unspecified: Secondary | ICD-10-CM | POA: Diagnosis not present

## 2019-02-24 DIAGNOSIS — L97418 Non-pressure chronic ulcer of right heel and midfoot with other specified severity: Secondary | ICD-10-CM | POA: Diagnosis not present

## 2019-02-24 DIAGNOSIS — E7800 Pure hypercholesterolemia, unspecified: Secondary | ICD-10-CM | POA: Diagnosis not present

## 2019-02-24 DIAGNOSIS — Z9989 Dependence on other enabling machines and devices: Secondary | ICD-10-CM | POA: Diagnosis not present

## 2019-02-24 DIAGNOSIS — G4733 Obstructive sleep apnea (adult) (pediatric): Secondary | ICD-10-CM | POA: Diagnosis not present

## 2019-02-24 DIAGNOSIS — J45909 Unspecified asthma, uncomplicated: Secondary | ICD-10-CM | POA: Diagnosis not present

## 2019-02-24 DIAGNOSIS — Z7984 Long term (current) use of oral hypoglycemic drugs: Secondary | ICD-10-CM | POA: Diagnosis not present

## 2019-02-24 DIAGNOSIS — Z87891 Personal history of nicotine dependence: Secondary | ICD-10-CM | POA: Diagnosis not present

## 2019-02-24 DIAGNOSIS — K219 Gastro-esophageal reflux disease without esophagitis: Secondary | ICD-10-CM | POA: Diagnosis not present

## 2019-02-24 DIAGNOSIS — I110 Hypertensive heart disease with heart failure: Secondary | ICD-10-CM | POA: Diagnosis not present

## 2019-02-26 DIAGNOSIS — L0390 Cellulitis, unspecified: Secondary | ICD-10-CM | POA: Diagnosis not present

## 2019-02-28 DIAGNOSIS — Z9981 Dependence on supplemental oxygen: Secondary | ICD-10-CM | POA: Diagnosis not present

## 2019-02-28 DIAGNOSIS — E1165 Type 2 diabetes mellitus with hyperglycemia: Secondary | ICD-10-CM | POA: Diagnosis not present

## 2019-02-28 DIAGNOSIS — E612 Magnesium deficiency: Secondary | ICD-10-CM | POA: Diagnosis not present

## 2019-02-28 DIAGNOSIS — L97412 Non-pressure chronic ulcer of right heel and midfoot with fat layer exposed: Secondary | ICD-10-CM | POA: Diagnosis not present

## 2019-02-28 DIAGNOSIS — I10 Essential (primary) hypertension: Secondary | ICD-10-CM | POA: Diagnosis not present

## 2019-02-28 DIAGNOSIS — Z794 Long term (current) use of insulin: Secondary | ICD-10-CM | POA: Diagnosis not present

## 2019-02-28 DIAGNOSIS — L97522 Non-pressure chronic ulcer of other part of left foot with fat layer exposed: Secondary | ICD-10-CM | POA: Diagnosis not present

## 2019-02-28 DIAGNOSIS — K219 Gastro-esophageal reflux disease without esophagitis: Secondary | ICD-10-CM | POA: Diagnosis not present

## 2019-02-28 DIAGNOSIS — E11621 Type 2 diabetes mellitus with foot ulcer: Secondary | ICD-10-CM | POA: Diagnosis not present

## 2019-02-28 DIAGNOSIS — E7800 Pure hypercholesterolemia, unspecified: Secondary | ICD-10-CM | POA: Diagnosis not present

## 2019-02-28 DIAGNOSIS — D649 Anemia, unspecified: Secondary | ICD-10-CM | POA: Diagnosis not present

## 2019-02-28 DIAGNOSIS — Z9181 History of falling: Secondary | ICD-10-CM | POA: Diagnosis not present

## 2019-02-28 DIAGNOSIS — J449 Chronic obstructive pulmonary disease, unspecified: Secondary | ICD-10-CM | POA: Diagnosis not present

## 2019-02-28 DIAGNOSIS — Z79891 Long term (current) use of opiate analgesic: Secondary | ICD-10-CM | POA: Diagnosis not present

## 2019-02-28 DIAGNOSIS — E1142 Type 2 diabetes mellitus with diabetic polyneuropathy: Secondary | ICD-10-CM | POA: Diagnosis not present

## 2019-02-28 DIAGNOSIS — E559 Vitamin D deficiency, unspecified: Secondary | ICD-10-CM | POA: Diagnosis not present

## 2019-03-03 DIAGNOSIS — J449 Chronic obstructive pulmonary disease, unspecified: Secondary | ICD-10-CM | POA: Diagnosis not present

## 2019-03-03 DIAGNOSIS — E1165 Type 2 diabetes mellitus with hyperglycemia: Secondary | ICD-10-CM | POA: Diagnosis not present

## 2019-03-03 DIAGNOSIS — Z794 Long term (current) use of insulin: Secondary | ICD-10-CM | POA: Diagnosis not present

## 2019-03-03 DIAGNOSIS — L97412 Non-pressure chronic ulcer of right heel and midfoot with fat layer exposed: Secondary | ICD-10-CM | POA: Diagnosis not present

## 2019-03-03 DIAGNOSIS — E559 Vitamin D deficiency, unspecified: Secondary | ICD-10-CM | POA: Diagnosis not present

## 2019-03-03 DIAGNOSIS — Z9181 History of falling: Secondary | ICD-10-CM | POA: Diagnosis not present

## 2019-03-03 DIAGNOSIS — L97522 Non-pressure chronic ulcer of other part of left foot with fat layer exposed: Secondary | ICD-10-CM | POA: Diagnosis not present

## 2019-03-03 DIAGNOSIS — K219 Gastro-esophageal reflux disease without esophagitis: Secondary | ICD-10-CM | POA: Diagnosis not present

## 2019-03-03 DIAGNOSIS — E7800 Pure hypercholesterolemia, unspecified: Secondary | ICD-10-CM | POA: Diagnosis not present

## 2019-03-03 DIAGNOSIS — Z79891 Long term (current) use of opiate analgesic: Secondary | ICD-10-CM | POA: Diagnosis not present

## 2019-03-03 DIAGNOSIS — E11621 Type 2 diabetes mellitus with foot ulcer: Secondary | ICD-10-CM | POA: Diagnosis not present

## 2019-03-03 DIAGNOSIS — E1142 Type 2 diabetes mellitus with diabetic polyneuropathy: Secondary | ICD-10-CM | POA: Diagnosis not present

## 2019-03-03 DIAGNOSIS — I10 Essential (primary) hypertension: Secondary | ICD-10-CM | POA: Diagnosis not present

## 2019-03-03 DIAGNOSIS — E612 Magnesium deficiency: Secondary | ICD-10-CM | POA: Diagnosis not present

## 2019-03-03 DIAGNOSIS — Z9981 Dependence on supplemental oxygen: Secondary | ICD-10-CM | POA: Diagnosis not present

## 2019-03-03 DIAGNOSIS — D649 Anemia, unspecified: Secondary | ICD-10-CM | POA: Diagnosis not present

## 2019-03-07 DIAGNOSIS — Z9181 History of falling: Secondary | ICD-10-CM | POA: Diagnosis not present

## 2019-03-07 DIAGNOSIS — E1142 Type 2 diabetes mellitus with diabetic polyneuropathy: Secondary | ICD-10-CM | POA: Diagnosis not present

## 2019-03-07 DIAGNOSIS — E11621 Type 2 diabetes mellitus with foot ulcer: Secondary | ICD-10-CM | POA: Diagnosis not present

## 2019-03-07 DIAGNOSIS — Z79891 Long term (current) use of opiate analgesic: Secondary | ICD-10-CM | POA: Diagnosis not present

## 2019-03-07 DIAGNOSIS — L97522 Non-pressure chronic ulcer of other part of left foot with fat layer exposed: Secondary | ICD-10-CM | POA: Diagnosis not present

## 2019-03-07 DIAGNOSIS — Z9981 Dependence on supplemental oxygen: Secondary | ICD-10-CM | POA: Diagnosis not present

## 2019-03-07 DIAGNOSIS — J449 Chronic obstructive pulmonary disease, unspecified: Secondary | ICD-10-CM | POA: Diagnosis not present

## 2019-03-07 DIAGNOSIS — I10 Essential (primary) hypertension: Secondary | ICD-10-CM | POA: Diagnosis not present

## 2019-03-07 DIAGNOSIS — K219 Gastro-esophageal reflux disease without esophagitis: Secondary | ICD-10-CM | POA: Diagnosis not present

## 2019-03-07 DIAGNOSIS — E612 Magnesium deficiency: Secondary | ICD-10-CM | POA: Diagnosis not present

## 2019-03-07 DIAGNOSIS — E1165 Type 2 diabetes mellitus with hyperglycemia: Secondary | ICD-10-CM | POA: Diagnosis not present

## 2019-03-07 DIAGNOSIS — E559 Vitamin D deficiency, unspecified: Secondary | ICD-10-CM | POA: Diagnosis not present

## 2019-03-07 DIAGNOSIS — Z794 Long term (current) use of insulin: Secondary | ICD-10-CM | POA: Diagnosis not present

## 2019-03-07 DIAGNOSIS — D649 Anemia, unspecified: Secondary | ICD-10-CM | POA: Diagnosis not present

## 2019-03-07 DIAGNOSIS — L97412 Non-pressure chronic ulcer of right heel and midfoot with fat layer exposed: Secondary | ICD-10-CM | POA: Diagnosis not present

## 2019-03-07 DIAGNOSIS — E7800 Pure hypercholesterolemia, unspecified: Secondary | ICD-10-CM | POA: Diagnosis not present

## 2019-03-08 DIAGNOSIS — L97409 Non-pressure chronic ulcer of unspecified heel and midfoot with unspecified severity: Secondary | ICD-10-CM | POA: Diagnosis not present

## 2019-03-08 DIAGNOSIS — E11621 Type 2 diabetes mellitus with foot ulcer: Secondary | ICD-10-CM | POA: Diagnosis not present

## 2019-03-09 DIAGNOSIS — Z9181 History of falling: Secondary | ICD-10-CM | POA: Diagnosis not present

## 2019-03-09 DIAGNOSIS — D649 Anemia, unspecified: Secondary | ICD-10-CM | POA: Diagnosis not present

## 2019-03-09 DIAGNOSIS — Z794 Long term (current) use of insulin: Secondary | ICD-10-CM | POA: Diagnosis not present

## 2019-03-09 DIAGNOSIS — L97412 Non-pressure chronic ulcer of right heel and midfoot with fat layer exposed: Secondary | ICD-10-CM | POA: Diagnosis not present

## 2019-03-09 DIAGNOSIS — E1142 Type 2 diabetes mellitus with diabetic polyneuropathy: Secondary | ICD-10-CM | POA: Diagnosis not present

## 2019-03-09 DIAGNOSIS — Z9981 Dependence on supplemental oxygen: Secondary | ICD-10-CM | POA: Diagnosis not present

## 2019-03-09 DIAGNOSIS — E612 Magnesium deficiency: Secondary | ICD-10-CM | POA: Diagnosis not present

## 2019-03-09 DIAGNOSIS — I10 Essential (primary) hypertension: Secondary | ICD-10-CM | POA: Diagnosis not present

## 2019-03-09 DIAGNOSIS — L97522 Non-pressure chronic ulcer of other part of left foot with fat layer exposed: Secondary | ICD-10-CM | POA: Diagnosis not present

## 2019-03-09 DIAGNOSIS — E1165 Type 2 diabetes mellitus with hyperglycemia: Secondary | ICD-10-CM | POA: Diagnosis not present

## 2019-03-09 DIAGNOSIS — J449 Chronic obstructive pulmonary disease, unspecified: Secondary | ICD-10-CM | POA: Diagnosis not present

## 2019-03-09 DIAGNOSIS — E7800 Pure hypercholesterolemia, unspecified: Secondary | ICD-10-CM | POA: Diagnosis not present

## 2019-03-09 DIAGNOSIS — Z79891 Long term (current) use of opiate analgesic: Secondary | ICD-10-CM | POA: Diagnosis not present

## 2019-03-09 DIAGNOSIS — E11621 Type 2 diabetes mellitus with foot ulcer: Secondary | ICD-10-CM | POA: Diagnosis not present

## 2019-03-09 DIAGNOSIS — E559 Vitamin D deficiency, unspecified: Secondary | ICD-10-CM | POA: Diagnosis not present

## 2019-03-09 DIAGNOSIS — K219 Gastro-esophageal reflux disease without esophagitis: Secondary | ICD-10-CM | POA: Diagnosis not present

## 2019-03-10 DIAGNOSIS — E612 Magnesium deficiency: Secondary | ICD-10-CM | POA: Diagnosis not present

## 2019-03-10 DIAGNOSIS — I10 Essential (primary) hypertension: Secondary | ICD-10-CM | POA: Diagnosis not present

## 2019-03-10 DIAGNOSIS — E11621 Type 2 diabetes mellitus with foot ulcer: Secondary | ICD-10-CM | POA: Diagnosis not present

## 2019-03-10 DIAGNOSIS — E559 Vitamin D deficiency, unspecified: Secondary | ICD-10-CM | POA: Diagnosis not present

## 2019-03-10 DIAGNOSIS — L97522 Non-pressure chronic ulcer of other part of left foot with fat layer exposed: Secondary | ICD-10-CM | POA: Diagnosis not present

## 2019-03-10 DIAGNOSIS — Z794 Long term (current) use of insulin: Secondary | ICD-10-CM | POA: Diagnosis not present

## 2019-03-10 DIAGNOSIS — J449 Chronic obstructive pulmonary disease, unspecified: Secondary | ICD-10-CM | POA: Diagnosis not present

## 2019-03-10 DIAGNOSIS — Z9981 Dependence on supplemental oxygen: Secondary | ICD-10-CM | POA: Diagnosis not present

## 2019-03-10 DIAGNOSIS — K219 Gastro-esophageal reflux disease without esophagitis: Secondary | ICD-10-CM | POA: Diagnosis not present

## 2019-03-10 DIAGNOSIS — Z9181 History of falling: Secondary | ICD-10-CM | POA: Diagnosis not present

## 2019-03-10 DIAGNOSIS — E1142 Type 2 diabetes mellitus with diabetic polyneuropathy: Secondary | ICD-10-CM | POA: Diagnosis not present

## 2019-03-10 DIAGNOSIS — T8189XA Other complications of procedures, not elsewhere classified, initial encounter: Secondary | ICD-10-CM | POA: Diagnosis not present

## 2019-03-10 DIAGNOSIS — E1165 Type 2 diabetes mellitus with hyperglycemia: Secondary | ICD-10-CM | POA: Diagnosis not present

## 2019-03-10 DIAGNOSIS — L97412 Non-pressure chronic ulcer of right heel and midfoot with fat layer exposed: Secondary | ICD-10-CM | POA: Diagnosis not present

## 2019-03-10 DIAGNOSIS — Z79891 Long term (current) use of opiate analgesic: Secondary | ICD-10-CM | POA: Diagnosis not present

## 2019-03-10 DIAGNOSIS — D649 Anemia, unspecified: Secondary | ICD-10-CM | POA: Diagnosis not present

## 2019-03-10 DIAGNOSIS — E7800 Pure hypercholesterolemia, unspecified: Secondary | ICD-10-CM | POA: Diagnosis not present

## 2019-03-11 DIAGNOSIS — Z9181 History of falling: Secondary | ICD-10-CM | POA: Diagnosis not present

## 2019-03-11 DIAGNOSIS — J449 Chronic obstructive pulmonary disease, unspecified: Secondary | ICD-10-CM | POA: Diagnosis not present

## 2019-03-11 DIAGNOSIS — E11621 Type 2 diabetes mellitus with foot ulcer: Secondary | ICD-10-CM | POA: Diagnosis not present

## 2019-03-11 DIAGNOSIS — E1165 Type 2 diabetes mellitus with hyperglycemia: Secondary | ICD-10-CM | POA: Diagnosis not present

## 2019-03-11 DIAGNOSIS — Z79891 Long term (current) use of opiate analgesic: Secondary | ICD-10-CM | POA: Diagnosis not present

## 2019-03-11 DIAGNOSIS — E1142 Type 2 diabetes mellitus with diabetic polyneuropathy: Secondary | ICD-10-CM | POA: Diagnosis not present

## 2019-03-11 DIAGNOSIS — E612 Magnesium deficiency: Secondary | ICD-10-CM | POA: Diagnosis not present

## 2019-03-11 DIAGNOSIS — E7800 Pure hypercholesterolemia, unspecified: Secondary | ICD-10-CM | POA: Diagnosis not present

## 2019-03-11 DIAGNOSIS — E559 Vitamin D deficiency, unspecified: Secondary | ICD-10-CM | POA: Diagnosis not present

## 2019-03-11 DIAGNOSIS — L97522 Non-pressure chronic ulcer of other part of left foot with fat layer exposed: Secondary | ICD-10-CM | POA: Diagnosis not present

## 2019-03-11 DIAGNOSIS — Z794 Long term (current) use of insulin: Secondary | ICD-10-CM | POA: Diagnosis not present

## 2019-03-11 DIAGNOSIS — D649 Anemia, unspecified: Secondary | ICD-10-CM | POA: Diagnosis not present

## 2019-03-11 DIAGNOSIS — I10 Essential (primary) hypertension: Secondary | ICD-10-CM | POA: Diagnosis not present

## 2019-03-11 DIAGNOSIS — K219 Gastro-esophageal reflux disease without esophagitis: Secondary | ICD-10-CM | POA: Diagnosis not present

## 2019-03-11 DIAGNOSIS — L97412 Non-pressure chronic ulcer of right heel and midfoot with fat layer exposed: Secondary | ICD-10-CM | POA: Diagnosis not present

## 2019-03-11 DIAGNOSIS — Z9981 Dependence on supplemental oxygen: Secondary | ICD-10-CM | POA: Diagnosis not present

## 2019-03-12 DIAGNOSIS — I10 Essential (primary) hypertension: Secondary | ICD-10-CM | POA: Diagnosis not present

## 2019-03-12 DIAGNOSIS — E1165 Type 2 diabetes mellitus with hyperglycemia: Secondary | ICD-10-CM | POA: Diagnosis not present

## 2019-03-12 DIAGNOSIS — J449 Chronic obstructive pulmonary disease, unspecified: Secondary | ICD-10-CM | POA: Diagnosis not present

## 2019-03-12 DIAGNOSIS — Z79891 Long term (current) use of opiate analgesic: Secondary | ICD-10-CM | POA: Diagnosis not present

## 2019-03-12 DIAGNOSIS — E1142 Type 2 diabetes mellitus with diabetic polyneuropathy: Secondary | ICD-10-CM | POA: Diagnosis not present

## 2019-03-12 DIAGNOSIS — L97522 Non-pressure chronic ulcer of other part of left foot with fat layer exposed: Secondary | ICD-10-CM | POA: Diagnosis not present

## 2019-03-12 DIAGNOSIS — E612 Magnesium deficiency: Secondary | ICD-10-CM | POA: Diagnosis not present

## 2019-03-12 DIAGNOSIS — Z794 Long term (current) use of insulin: Secondary | ICD-10-CM | POA: Diagnosis not present

## 2019-03-12 DIAGNOSIS — E11621 Type 2 diabetes mellitus with foot ulcer: Secondary | ICD-10-CM | POA: Diagnosis not present

## 2019-03-12 DIAGNOSIS — E7800 Pure hypercholesterolemia, unspecified: Secondary | ICD-10-CM | POA: Diagnosis not present

## 2019-03-12 DIAGNOSIS — K219 Gastro-esophageal reflux disease without esophagitis: Secondary | ICD-10-CM | POA: Diagnosis not present

## 2019-03-12 DIAGNOSIS — D649 Anemia, unspecified: Secondary | ICD-10-CM | POA: Diagnosis not present

## 2019-03-12 DIAGNOSIS — E559 Vitamin D deficiency, unspecified: Secondary | ICD-10-CM | POA: Diagnosis not present

## 2019-03-12 DIAGNOSIS — Z9181 History of falling: Secondary | ICD-10-CM | POA: Diagnosis not present

## 2019-03-12 DIAGNOSIS — Z9981 Dependence on supplemental oxygen: Secondary | ICD-10-CM | POA: Diagnosis not present

## 2019-03-12 DIAGNOSIS — L97412 Non-pressure chronic ulcer of right heel and midfoot with fat layer exposed: Secondary | ICD-10-CM | POA: Diagnosis not present

## 2019-03-14 DIAGNOSIS — L97412 Non-pressure chronic ulcer of right heel and midfoot with fat layer exposed: Secondary | ICD-10-CM | POA: Diagnosis not present

## 2019-03-14 DIAGNOSIS — E11621 Type 2 diabetes mellitus with foot ulcer: Secondary | ICD-10-CM | POA: Diagnosis not present

## 2019-03-14 DIAGNOSIS — T8189XA Other complications of procedures, not elsewhere classified, initial encounter: Secondary | ICD-10-CM | POA: Diagnosis not present

## 2019-03-14 DIAGNOSIS — S91301A Unspecified open wound, right foot, initial encounter: Secondary | ICD-10-CM | POA: Diagnosis not present

## 2019-03-14 DIAGNOSIS — S91102A Unspecified open wound of left great toe without damage to nail, initial encounter: Secondary | ICD-10-CM | POA: Diagnosis not present

## 2019-03-14 DIAGNOSIS — L97522 Non-pressure chronic ulcer of other part of left foot with fat layer exposed: Secondary | ICD-10-CM | POA: Diagnosis not present

## 2019-03-15 DIAGNOSIS — M47816 Spondylosis without myelopathy or radiculopathy, lumbar region: Secondary | ICD-10-CM | POA: Diagnosis not present

## 2019-03-15 DIAGNOSIS — E119 Type 2 diabetes mellitus without complications: Secondary | ICD-10-CM | POA: Diagnosis not present

## 2019-03-15 DIAGNOSIS — M461 Sacroiliitis, not elsewhere classified: Secondary | ICD-10-CM | POA: Diagnosis not present

## 2019-03-15 DIAGNOSIS — G894 Chronic pain syndrome: Secondary | ICD-10-CM | POA: Diagnosis not present

## 2019-03-16 DIAGNOSIS — E7800 Pure hypercholesterolemia, unspecified: Secondary | ICD-10-CM | POA: Diagnosis not present

## 2019-03-16 DIAGNOSIS — K219 Gastro-esophageal reflux disease without esophagitis: Secondary | ICD-10-CM | POA: Diagnosis not present

## 2019-03-16 DIAGNOSIS — Z794 Long term (current) use of insulin: Secondary | ICD-10-CM | POA: Diagnosis not present

## 2019-03-16 DIAGNOSIS — I10 Essential (primary) hypertension: Secondary | ICD-10-CM | POA: Diagnosis not present

## 2019-03-16 DIAGNOSIS — E11621 Type 2 diabetes mellitus with foot ulcer: Secondary | ICD-10-CM | POA: Diagnosis not present

## 2019-03-16 DIAGNOSIS — D649 Anemia, unspecified: Secondary | ICD-10-CM | POA: Diagnosis not present

## 2019-03-16 DIAGNOSIS — E1165 Type 2 diabetes mellitus with hyperglycemia: Secondary | ICD-10-CM | POA: Diagnosis not present

## 2019-03-16 DIAGNOSIS — E1142 Type 2 diabetes mellitus with diabetic polyneuropathy: Secondary | ICD-10-CM | POA: Diagnosis not present

## 2019-03-16 DIAGNOSIS — E612 Magnesium deficiency: Secondary | ICD-10-CM | POA: Diagnosis not present

## 2019-03-16 DIAGNOSIS — J449 Chronic obstructive pulmonary disease, unspecified: Secondary | ICD-10-CM | POA: Diagnosis not present

## 2019-03-16 DIAGNOSIS — Z79891 Long term (current) use of opiate analgesic: Secondary | ICD-10-CM | POA: Diagnosis not present

## 2019-03-16 DIAGNOSIS — L97522 Non-pressure chronic ulcer of other part of left foot with fat layer exposed: Secondary | ICD-10-CM | POA: Diagnosis not present

## 2019-03-16 DIAGNOSIS — Z9181 History of falling: Secondary | ICD-10-CM | POA: Diagnosis not present

## 2019-03-16 DIAGNOSIS — E559 Vitamin D deficiency, unspecified: Secondary | ICD-10-CM | POA: Diagnosis not present

## 2019-03-16 DIAGNOSIS — L97412 Non-pressure chronic ulcer of right heel and midfoot with fat layer exposed: Secondary | ICD-10-CM | POA: Diagnosis not present

## 2019-03-16 DIAGNOSIS — Z9981 Dependence on supplemental oxygen: Secondary | ICD-10-CM | POA: Diagnosis not present

## 2019-03-17 DIAGNOSIS — Z9181 History of falling: Secondary | ICD-10-CM | POA: Diagnosis not present

## 2019-03-17 DIAGNOSIS — E612 Magnesium deficiency: Secondary | ICD-10-CM | POA: Diagnosis not present

## 2019-03-17 DIAGNOSIS — E1142 Type 2 diabetes mellitus with diabetic polyneuropathy: Secondary | ICD-10-CM | POA: Diagnosis not present

## 2019-03-17 DIAGNOSIS — Z79891 Long term (current) use of opiate analgesic: Secondary | ICD-10-CM | POA: Diagnosis not present

## 2019-03-17 DIAGNOSIS — D649 Anemia, unspecified: Secondary | ICD-10-CM | POA: Diagnosis not present

## 2019-03-17 DIAGNOSIS — E11621 Type 2 diabetes mellitus with foot ulcer: Secondary | ICD-10-CM | POA: Diagnosis not present

## 2019-03-17 DIAGNOSIS — K219 Gastro-esophageal reflux disease without esophagitis: Secondary | ICD-10-CM | POA: Diagnosis not present

## 2019-03-17 DIAGNOSIS — I10 Essential (primary) hypertension: Secondary | ICD-10-CM | POA: Diagnosis not present

## 2019-03-17 DIAGNOSIS — L97412 Non-pressure chronic ulcer of right heel and midfoot with fat layer exposed: Secondary | ICD-10-CM | POA: Diagnosis not present

## 2019-03-17 DIAGNOSIS — L97522 Non-pressure chronic ulcer of other part of left foot with fat layer exposed: Secondary | ICD-10-CM | POA: Diagnosis not present

## 2019-03-17 DIAGNOSIS — E559 Vitamin D deficiency, unspecified: Secondary | ICD-10-CM | POA: Diagnosis not present

## 2019-03-17 DIAGNOSIS — Z794 Long term (current) use of insulin: Secondary | ICD-10-CM | POA: Diagnosis not present

## 2019-03-17 DIAGNOSIS — E1165 Type 2 diabetes mellitus with hyperglycemia: Secondary | ICD-10-CM | POA: Diagnosis not present

## 2019-03-17 DIAGNOSIS — Z9981 Dependence on supplemental oxygen: Secondary | ICD-10-CM | POA: Diagnosis not present

## 2019-03-17 DIAGNOSIS — J449 Chronic obstructive pulmonary disease, unspecified: Secondary | ICD-10-CM | POA: Diagnosis not present

## 2019-03-17 DIAGNOSIS — E7800 Pure hypercholesterolemia, unspecified: Secondary | ICD-10-CM | POA: Diagnosis not present

## 2019-03-20 DIAGNOSIS — L97522 Non-pressure chronic ulcer of other part of left foot with fat layer exposed: Secondary | ICD-10-CM | POA: Diagnosis not present

## 2019-03-20 DIAGNOSIS — E11621 Type 2 diabetes mellitus with foot ulcer: Secondary | ICD-10-CM | POA: Diagnosis not present

## 2019-03-20 DIAGNOSIS — T8189XA Other complications of procedures, not elsewhere classified, initial encounter: Secondary | ICD-10-CM | POA: Diagnosis not present

## 2019-03-20 DIAGNOSIS — S91301A Unspecified open wound, right foot, initial encounter: Secondary | ICD-10-CM | POA: Diagnosis not present

## 2019-03-20 DIAGNOSIS — S91102A Unspecified open wound of left great toe without damage to nail, initial encounter: Secondary | ICD-10-CM | POA: Diagnosis not present

## 2019-03-20 DIAGNOSIS — L97412 Non-pressure chronic ulcer of right heel and midfoot with fat layer exposed: Secondary | ICD-10-CM | POA: Diagnosis not present

## 2019-03-22 DIAGNOSIS — Z794 Long term (current) use of insulin: Secondary | ICD-10-CM | POA: Diagnosis not present

## 2019-03-22 DIAGNOSIS — Z79891 Long term (current) use of opiate analgesic: Secondary | ICD-10-CM | POA: Diagnosis not present

## 2019-03-22 DIAGNOSIS — E11621 Type 2 diabetes mellitus with foot ulcer: Secondary | ICD-10-CM | POA: Diagnosis not present

## 2019-03-22 DIAGNOSIS — K219 Gastro-esophageal reflux disease without esophagitis: Secondary | ICD-10-CM | POA: Diagnosis not present

## 2019-03-22 DIAGNOSIS — D649 Anemia, unspecified: Secondary | ICD-10-CM | POA: Diagnosis not present

## 2019-03-22 DIAGNOSIS — I10 Essential (primary) hypertension: Secondary | ICD-10-CM | POA: Diagnosis not present

## 2019-03-22 DIAGNOSIS — E1165 Type 2 diabetes mellitus with hyperglycemia: Secondary | ICD-10-CM | POA: Diagnosis not present

## 2019-03-22 DIAGNOSIS — Z9181 History of falling: Secondary | ICD-10-CM | POA: Diagnosis not present

## 2019-03-22 DIAGNOSIS — L97522 Non-pressure chronic ulcer of other part of left foot with fat layer exposed: Secondary | ICD-10-CM | POA: Diagnosis not present

## 2019-03-22 DIAGNOSIS — E612 Magnesium deficiency: Secondary | ICD-10-CM | POA: Diagnosis not present

## 2019-03-22 DIAGNOSIS — E1142 Type 2 diabetes mellitus with diabetic polyneuropathy: Secondary | ICD-10-CM | POA: Diagnosis not present

## 2019-03-22 DIAGNOSIS — J449 Chronic obstructive pulmonary disease, unspecified: Secondary | ICD-10-CM | POA: Diagnosis not present

## 2019-03-22 DIAGNOSIS — E7800 Pure hypercholesterolemia, unspecified: Secondary | ICD-10-CM | POA: Diagnosis not present

## 2019-03-22 DIAGNOSIS — L97412 Non-pressure chronic ulcer of right heel and midfoot with fat layer exposed: Secondary | ICD-10-CM | POA: Diagnosis not present

## 2019-03-22 DIAGNOSIS — Z9981 Dependence on supplemental oxygen: Secondary | ICD-10-CM | POA: Diagnosis not present

## 2019-03-22 DIAGNOSIS — E559 Vitamin D deficiency, unspecified: Secondary | ICD-10-CM | POA: Diagnosis not present

## 2019-03-23 DIAGNOSIS — J449 Chronic obstructive pulmonary disease, unspecified: Secondary | ICD-10-CM | POA: Diagnosis not present

## 2019-03-24 DIAGNOSIS — K219 Gastro-esophageal reflux disease without esophagitis: Secondary | ICD-10-CM | POA: Diagnosis not present

## 2019-03-24 DIAGNOSIS — Z794 Long term (current) use of insulin: Secondary | ICD-10-CM | POA: Diagnosis not present

## 2019-03-24 DIAGNOSIS — Z79891 Long term (current) use of opiate analgesic: Secondary | ICD-10-CM | POA: Diagnosis not present

## 2019-03-24 DIAGNOSIS — E7800 Pure hypercholesterolemia, unspecified: Secondary | ICD-10-CM | POA: Diagnosis not present

## 2019-03-24 DIAGNOSIS — E11621 Type 2 diabetes mellitus with foot ulcer: Secondary | ICD-10-CM | POA: Diagnosis not present

## 2019-03-24 DIAGNOSIS — E559 Vitamin D deficiency, unspecified: Secondary | ICD-10-CM | POA: Diagnosis not present

## 2019-03-24 DIAGNOSIS — Z9981 Dependence on supplemental oxygen: Secondary | ICD-10-CM | POA: Diagnosis not present

## 2019-03-24 DIAGNOSIS — I10 Essential (primary) hypertension: Secondary | ICD-10-CM | POA: Diagnosis not present

## 2019-03-24 DIAGNOSIS — Z9181 History of falling: Secondary | ICD-10-CM | POA: Diagnosis not present

## 2019-03-24 DIAGNOSIS — E612 Magnesium deficiency: Secondary | ICD-10-CM | POA: Diagnosis not present

## 2019-03-24 DIAGNOSIS — L97522 Non-pressure chronic ulcer of other part of left foot with fat layer exposed: Secondary | ICD-10-CM | POA: Diagnosis not present

## 2019-03-24 DIAGNOSIS — E1142 Type 2 diabetes mellitus with diabetic polyneuropathy: Secondary | ICD-10-CM | POA: Diagnosis not present

## 2019-03-24 DIAGNOSIS — D649 Anemia, unspecified: Secondary | ICD-10-CM | POA: Diagnosis not present

## 2019-03-24 DIAGNOSIS — J449 Chronic obstructive pulmonary disease, unspecified: Secondary | ICD-10-CM | POA: Diagnosis not present

## 2019-03-24 DIAGNOSIS — L97412 Non-pressure chronic ulcer of right heel and midfoot with fat layer exposed: Secondary | ICD-10-CM | POA: Diagnosis not present

## 2019-03-24 DIAGNOSIS — E1165 Type 2 diabetes mellitus with hyperglycemia: Secondary | ICD-10-CM | POA: Diagnosis not present

## 2019-03-25 DIAGNOSIS — E11621 Type 2 diabetes mellitus with foot ulcer: Secondary | ICD-10-CM | POA: Diagnosis not present

## 2019-03-25 DIAGNOSIS — L97521 Non-pressure chronic ulcer of other part of left foot limited to breakdown of skin: Secondary | ICD-10-CM | POA: Diagnosis not present

## 2019-03-25 DIAGNOSIS — L97409 Non-pressure chronic ulcer of unspecified heel and midfoot with unspecified severity: Secondary | ICD-10-CM | POA: Diagnosis not present

## 2019-03-25 DIAGNOSIS — E08621 Diabetes mellitus due to underlying condition with foot ulcer: Secondary | ICD-10-CM | POA: Diagnosis not present

## 2019-03-27 DIAGNOSIS — L97522 Non-pressure chronic ulcer of other part of left foot with fat layer exposed: Secondary | ICD-10-CM | POA: Diagnosis not present

## 2019-03-27 DIAGNOSIS — T8189XA Other complications of procedures, not elsewhere classified, initial encounter: Secondary | ICD-10-CM | POA: Diagnosis not present

## 2019-03-27 DIAGNOSIS — E11621 Type 2 diabetes mellitus with foot ulcer: Secondary | ICD-10-CM | POA: Diagnosis not present

## 2019-03-27 DIAGNOSIS — S91301A Unspecified open wound, right foot, initial encounter: Secondary | ICD-10-CM | POA: Diagnosis not present

## 2019-03-27 DIAGNOSIS — L97412 Non-pressure chronic ulcer of right heel and midfoot with fat layer exposed: Secondary | ICD-10-CM | POA: Diagnosis not present

## 2019-03-27 DIAGNOSIS — Z4889 Encounter for other specified surgical aftercare: Secondary | ICD-10-CM | POA: Diagnosis not present

## 2019-03-27 DIAGNOSIS — S91102A Unspecified open wound of left great toe without damage to nail, initial encounter: Secondary | ICD-10-CM | POA: Diagnosis not present

## 2019-03-29 DIAGNOSIS — E7800 Pure hypercholesterolemia, unspecified: Secondary | ICD-10-CM | POA: Diagnosis not present

## 2019-03-29 DIAGNOSIS — Z9181 History of falling: Secondary | ICD-10-CM | POA: Diagnosis not present

## 2019-03-29 DIAGNOSIS — D649 Anemia, unspecified: Secondary | ICD-10-CM | POA: Diagnosis not present

## 2019-03-29 DIAGNOSIS — G894 Chronic pain syndrome: Secondary | ICD-10-CM | POA: Diagnosis not present

## 2019-03-29 DIAGNOSIS — J449 Chronic obstructive pulmonary disease, unspecified: Secondary | ICD-10-CM | POA: Diagnosis not present

## 2019-03-29 DIAGNOSIS — E11621 Type 2 diabetes mellitus with foot ulcer: Secondary | ICD-10-CM | POA: Diagnosis not present

## 2019-03-29 DIAGNOSIS — M47816 Spondylosis without myelopathy or radiculopathy, lumbar region: Secondary | ICD-10-CM | POA: Diagnosis not present

## 2019-03-29 DIAGNOSIS — I10 Essential (primary) hypertension: Secondary | ICD-10-CM | POA: Diagnosis not present

## 2019-03-29 DIAGNOSIS — L97412 Non-pressure chronic ulcer of right heel and midfoot with fat layer exposed: Secondary | ICD-10-CM | POA: Diagnosis not present

## 2019-03-29 DIAGNOSIS — L97522 Non-pressure chronic ulcer of other part of left foot with fat layer exposed: Secondary | ICD-10-CM | POA: Diagnosis not present

## 2019-03-29 DIAGNOSIS — Z794 Long term (current) use of insulin: Secondary | ICD-10-CM | POA: Diagnosis not present

## 2019-03-29 DIAGNOSIS — E1165 Type 2 diabetes mellitus with hyperglycemia: Secondary | ICD-10-CM | POA: Diagnosis not present

## 2019-03-29 DIAGNOSIS — M461 Sacroiliitis, not elsewhere classified: Secondary | ICD-10-CM | POA: Diagnosis not present

## 2019-03-29 DIAGNOSIS — E119 Type 2 diabetes mellitus without complications: Secondary | ICD-10-CM | POA: Diagnosis not present

## 2019-03-29 DIAGNOSIS — E612 Magnesium deficiency: Secondary | ICD-10-CM | POA: Diagnosis not present

## 2019-03-29 DIAGNOSIS — E1142 Type 2 diabetes mellitus with diabetic polyneuropathy: Secondary | ICD-10-CM | POA: Diagnosis not present

## 2019-03-29 DIAGNOSIS — K219 Gastro-esophageal reflux disease without esophagitis: Secondary | ICD-10-CM | POA: Diagnosis not present

## 2019-03-29 DIAGNOSIS — E559 Vitamin D deficiency, unspecified: Secondary | ICD-10-CM | POA: Diagnosis not present

## 2019-03-29 DIAGNOSIS — L0390 Cellulitis, unspecified: Secondary | ICD-10-CM | POA: Diagnosis not present

## 2019-03-29 DIAGNOSIS — Z79891 Long term (current) use of opiate analgesic: Secondary | ICD-10-CM | POA: Diagnosis not present

## 2019-03-29 DIAGNOSIS — Z9981 Dependence on supplemental oxygen: Secondary | ICD-10-CM | POA: Diagnosis not present

## 2019-03-31 DIAGNOSIS — Z794 Long term (current) use of insulin: Secondary | ICD-10-CM | POA: Diagnosis not present

## 2019-03-31 DIAGNOSIS — E1165 Type 2 diabetes mellitus with hyperglycemia: Secondary | ICD-10-CM | POA: Diagnosis not present

## 2019-03-31 DIAGNOSIS — K219 Gastro-esophageal reflux disease without esophagitis: Secondary | ICD-10-CM | POA: Diagnosis not present

## 2019-03-31 DIAGNOSIS — J449 Chronic obstructive pulmonary disease, unspecified: Secondary | ICD-10-CM | POA: Diagnosis not present

## 2019-03-31 DIAGNOSIS — L97412 Non-pressure chronic ulcer of right heel and midfoot with fat layer exposed: Secondary | ICD-10-CM | POA: Diagnosis not present

## 2019-03-31 DIAGNOSIS — D649 Anemia, unspecified: Secondary | ICD-10-CM | POA: Diagnosis not present

## 2019-03-31 DIAGNOSIS — E559 Vitamin D deficiency, unspecified: Secondary | ICD-10-CM | POA: Diagnosis not present

## 2019-03-31 DIAGNOSIS — Z9981 Dependence on supplemental oxygen: Secondary | ICD-10-CM | POA: Diagnosis not present

## 2019-03-31 DIAGNOSIS — E11621 Type 2 diabetes mellitus with foot ulcer: Secondary | ICD-10-CM | POA: Diagnosis not present

## 2019-03-31 DIAGNOSIS — Z9181 History of falling: Secondary | ICD-10-CM | POA: Diagnosis not present

## 2019-03-31 DIAGNOSIS — E1142 Type 2 diabetes mellitus with diabetic polyneuropathy: Secondary | ICD-10-CM | POA: Diagnosis not present

## 2019-03-31 DIAGNOSIS — E7800 Pure hypercholesterolemia, unspecified: Secondary | ICD-10-CM | POA: Diagnosis not present

## 2019-03-31 DIAGNOSIS — Z79891 Long term (current) use of opiate analgesic: Secondary | ICD-10-CM | POA: Diagnosis not present

## 2019-03-31 DIAGNOSIS — L97522 Non-pressure chronic ulcer of other part of left foot with fat layer exposed: Secondary | ICD-10-CM | POA: Diagnosis not present

## 2019-03-31 DIAGNOSIS — E612 Magnesium deficiency: Secondary | ICD-10-CM | POA: Diagnosis not present

## 2019-03-31 DIAGNOSIS — I10 Essential (primary) hypertension: Secondary | ICD-10-CM | POA: Diagnosis not present

## 2019-04-03 DIAGNOSIS — S91301A Unspecified open wound, right foot, initial encounter: Secondary | ICD-10-CM | POA: Diagnosis not present

## 2019-04-03 DIAGNOSIS — S91102A Unspecified open wound of left great toe without damage to nail, initial encounter: Secondary | ICD-10-CM | POA: Diagnosis not present

## 2019-04-03 DIAGNOSIS — L97522 Non-pressure chronic ulcer of other part of left foot with fat layer exposed: Secondary | ICD-10-CM | POA: Diagnosis not present

## 2019-04-03 DIAGNOSIS — E11621 Type 2 diabetes mellitus with foot ulcer: Secondary | ICD-10-CM | POA: Diagnosis not present

## 2019-04-03 DIAGNOSIS — T8189XA Other complications of procedures, not elsewhere classified, initial encounter: Secondary | ICD-10-CM | POA: Diagnosis not present

## 2019-04-03 DIAGNOSIS — L97412 Non-pressure chronic ulcer of right heel and midfoot with fat layer exposed: Secondary | ICD-10-CM | POA: Diagnosis not present

## 2019-04-06 DIAGNOSIS — Z9181 History of falling: Secondary | ICD-10-CM | POA: Diagnosis not present

## 2019-04-06 DIAGNOSIS — J449 Chronic obstructive pulmonary disease, unspecified: Secondary | ICD-10-CM | POA: Diagnosis not present

## 2019-04-06 DIAGNOSIS — E1142 Type 2 diabetes mellitus with diabetic polyneuropathy: Secondary | ICD-10-CM | POA: Diagnosis not present

## 2019-04-06 DIAGNOSIS — Z794 Long term (current) use of insulin: Secondary | ICD-10-CM | POA: Diagnosis not present

## 2019-04-06 DIAGNOSIS — E559 Vitamin D deficiency, unspecified: Secondary | ICD-10-CM | POA: Diagnosis not present

## 2019-04-06 DIAGNOSIS — E1165 Type 2 diabetes mellitus with hyperglycemia: Secondary | ICD-10-CM | POA: Diagnosis not present

## 2019-04-06 DIAGNOSIS — L97522 Non-pressure chronic ulcer of other part of left foot with fat layer exposed: Secondary | ICD-10-CM | POA: Diagnosis not present

## 2019-04-06 DIAGNOSIS — D649 Anemia, unspecified: Secondary | ICD-10-CM | POA: Diagnosis not present

## 2019-04-06 DIAGNOSIS — K219 Gastro-esophageal reflux disease without esophagitis: Secondary | ICD-10-CM | POA: Diagnosis not present

## 2019-04-06 DIAGNOSIS — E612 Magnesium deficiency: Secondary | ICD-10-CM | POA: Diagnosis not present

## 2019-04-06 DIAGNOSIS — L97412 Non-pressure chronic ulcer of right heel and midfoot with fat layer exposed: Secondary | ICD-10-CM | POA: Diagnosis not present

## 2019-04-06 DIAGNOSIS — E7800 Pure hypercholesterolemia, unspecified: Secondary | ICD-10-CM | POA: Diagnosis not present

## 2019-04-06 DIAGNOSIS — Z9981 Dependence on supplemental oxygen: Secondary | ICD-10-CM | POA: Diagnosis not present

## 2019-04-06 DIAGNOSIS — Z79891 Long term (current) use of opiate analgesic: Secondary | ICD-10-CM | POA: Diagnosis not present

## 2019-04-06 DIAGNOSIS — I10 Essential (primary) hypertension: Secondary | ICD-10-CM | POA: Diagnosis not present

## 2019-04-06 DIAGNOSIS — E11621 Type 2 diabetes mellitus with foot ulcer: Secondary | ICD-10-CM | POA: Diagnosis not present

## 2019-04-09 DIAGNOSIS — E11621 Type 2 diabetes mellitus with foot ulcer: Secondary | ICD-10-CM | POA: Diagnosis not present

## 2019-04-09 DIAGNOSIS — T8189XA Other complications of procedures, not elsewhere classified, initial encounter: Secondary | ICD-10-CM | POA: Diagnosis not present

## 2019-04-10 DIAGNOSIS — L97412 Non-pressure chronic ulcer of right heel and midfoot with fat layer exposed: Secondary | ICD-10-CM | POA: Diagnosis not present

## 2019-04-10 DIAGNOSIS — E1165 Type 2 diabetes mellitus with hyperglycemia: Secondary | ICD-10-CM | POA: Diagnosis not present

## 2019-04-10 DIAGNOSIS — I10 Essential (primary) hypertension: Secondary | ICD-10-CM | POA: Diagnosis not present

## 2019-04-10 DIAGNOSIS — D649 Anemia, unspecified: Secondary | ICD-10-CM | POA: Diagnosis not present

## 2019-04-10 DIAGNOSIS — Z794 Long term (current) use of insulin: Secondary | ICD-10-CM | POA: Diagnosis not present

## 2019-04-10 DIAGNOSIS — Z9981 Dependence on supplemental oxygen: Secondary | ICD-10-CM | POA: Diagnosis not present

## 2019-04-10 DIAGNOSIS — E1142 Type 2 diabetes mellitus with diabetic polyneuropathy: Secondary | ICD-10-CM | POA: Diagnosis not present

## 2019-04-10 DIAGNOSIS — Z79891 Long term (current) use of opiate analgesic: Secondary | ICD-10-CM | POA: Diagnosis not present

## 2019-04-10 DIAGNOSIS — L97522 Non-pressure chronic ulcer of other part of left foot with fat layer exposed: Secondary | ICD-10-CM | POA: Diagnosis not present

## 2019-04-10 DIAGNOSIS — Z9181 History of falling: Secondary | ICD-10-CM | POA: Diagnosis not present

## 2019-04-10 DIAGNOSIS — E559 Vitamin D deficiency, unspecified: Secondary | ICD-10-CM | POA: Diagnosis not present

## 2019-04-10 DIAGNOSIS — E612 Magnesium deficiency: Secondary | ICD-10-CM | POA: Diagnosis not present

## 2019-04-10 DIAGNOSIS — K219 Gastro-esophageal reflux disease without esophagitis: Secondary | ICD-10-CM | POA: Diagnosis not present

## 2019-04-10 DIAGNOSIS — E7800 Pure hypercholesterolemia, unspecified: Secondary | ICD-10-CM | POA: Diagnosis not present

## 2019-04-10 DIAGNOSIS — J449 Chronic obstructive pulmonary disease, unspecified: Secondary | ICD-10-CM | POA: Diagnosis not present

## 2019-04-10 DIAGNOSIS — E11621 Type 2 diabetes mellitus with foot ulcer: Secondary | ICD-10-CM | POA: Diagnosis not present

## 2019-04-12 DIAGNOSIS — S91102A Unspecified open wound of left great toe without damage to nail, initial encounter: Secondary | ICD-10-CM | POA: Diagnosis not present

## 2019-04-12 DIAGNOSIS — E11621 Type 2 diabetes mellitus with foot ulcer: Secondary | ICD-10-CM | POA: Diagnosis not present

## 2019-04-12 DIAGNOSIS — L97522 Non-pressure chronic ulcer of other part of left foot with fat layer exposed: Secondary | ICD-10-CM | POA: Diagnosis not present

## 2019-04-12 DIAGNOSIS — L97412 Non-pressure chronic ulcer of right heel and midfoot with fat layer exposed: Secondary | ICD-10-CM | POA: Diagnosis not present

## 2019-04-12 DIAGNOSIS — T8189XA Other complications of procedures, not elsewhere classified, initial encounter: Secondary | ICD-10-CM | POA: Diagnosis not present

## 2019-04-12 DIAGNOSIS — S91301A Unspecified open wound, right foot, initial encounter: Secondary | ICD-10-CM | POA: Diagnosis not present

## 2019-04-14 DIAGNOSIS — L97412 Non-pressure chronic ulcer of right heel and midfoot with fat layer exposed: Secondary | ICD-10-CM | POA: Diagnosis not present

## 2019-04-14 DIAGNOSIS — Z794 Long term (current) use of insulin: Secondary | ICD-10-CM | POA: Diagnosis not present

## 2019-04-14 DIAGNOSIS — E7800 Pure hypercholesterolemia, unspecified: Secondary | ICD-10-CM | POA: Diagnosis not present

## 2019-04-14 DIAGNOSIS — D649 Anemia, unspecified: Secondary | ICD-10-CM | POA: Diagnosis not present

## 2019-04-14 DIAGNOSIS — Z9181 History of falling: Secondary | ICD-10-CM | POA: Diagnosis not present

## 2019-04-14 DIAGNOSIS — E612 Magnesium deficiency: Secondary | ICD-10-CM | POA: Diagnosis not present

## 2019-04-14 DIAGNOSIS — Z79891 Long term (current) use of opiate analgesic: Secondary | ICD-10-CM | POA: Diagnosis not present

## 2019-04-14 DIAGNOSIS — E1165 Type 2 diabetes mellitus with hyperglycemia: Secondary | ICD-10-CM | POA: Diagnosis not present

## 2019-04-14 DIAGNOSIS — L97522 Non-pressure chronic ulcer of other part of left foot with fat layer exposed: Secondary | ICD-10-CM | POA: Diagnosis not present

## 2019-04-14 DIAGNOSIS — Z9981 Dependence on supplemental oxygen: Secondary | ICD-10-CM | POA: Diagnosis not present

## 2019-04-14 DIAGNOSIS — J449 Chronic obstructive pulmonary disease, unspecified: Secondary | ICD-10-CM | POA: Diagnosis not present

## 2019-04-14 DIAGNOSIS — K219 Gastro-esophageal reflux disease without esophagitis: Secondary | ICD-10-CM | POA: Diagnosis not present

## 2019-04-14 DIAGNOSIS — E11621 Type 2 diabetes mellitus with foot ulcer: Secondary | ICD-10-CM | POA: Diagnosis not present

## 2019-04-14 DIAGNOSIS — E1142 Type 2 diabetes mellitus with diabetic polyneuropathy: Secondary | ICD-10-CM | POA: Diagnosis not present

## 2019-04-14 DIAGNOSIS — E559 Vitamin D deficiency, unspecified: Secondary | ICD-10-CM | POA: Diagnosis not present

## 2019-04-14 DIAGNOSIS — I10 Essential (primary) hypertension: Secondary | ICD-10-CM | POA: Diagnosis not present

## 2019-04-17 DIAGNOSIS — L84 Corns and callosities: Secondary | ICD-10-CM | POA: Diagnosis not present

## 2019-04-17 DIAGNOSIS — S91102A Unspecified open wound of left great toe without damage to nail, initial encounter: Secondary | ICD-10-CM | POA: Diagnosis not present

## 2019-04-17 DIAGNOSIS — T8189XA Other complications of procedures, not elsewhere classified, initial encounter: Secondary | ICD-10-CM | POA: Diagnosis not present

## 2019-04-17 DIAGNOSIS — S91301A Unspecified open wound, right foot, initial encounter: Secondary | ICD-10-CM | POA: Diagnosis not present

## 2019-04-19 DIAGNOSIS — E559 Vitamin D deficiency, unspecified: Secondary | ICD-10-CM | POA: Diagnosis not present

## 2019-04-19 DIAGNOSIS — K219 Gastro-esophageal reflux disease without esophagitis: Secondary | ICD-10-CM | POA: Diagnosis not present

## 2019-04-19 DIAGNOSIS — Z9981 Dependence on supplemental oxygen: Secondary | ICD-10-CM | POA: Diagnosis not present

## 2019-04-19 DIAGNOSIS — L97522 Non-pressure chronic ulcer of other part of left foot with fat layer exposed: Secondary | ICD-10-CM | POA: Diagnosis not present

## 2019-04-19 DIAGNOSIS — E612 Magnesium deficiency: Secondary | ICD-10-CM | POA: Diagnosis not present

## 2019-04-19 DIAGNOSIS — J449 Chronic obstructive pulmonary disease, unspecified: Secondary | ICD-10-CM | POA: Diagnosis not present

## 2019-04-19 DIAGNOSIS — Z79891 Long term (current) use of opiate analgesic: Secondary | ICD-10-CM | POA: Diagnosis not present

## 2019-04-19 DIAGNOSIS — E1142 Type 2 diabetes mellitus with diabetic polyneuropathy: Secondary | ICD-10-CM | POA: Diagnosis not present

## 2019-04-19 DIAGNOSIS — D649 Anemia, unspecified: Secondary | ICD-10-CM | POA: Diagnosis not present

## 2019-04-19 DIAGNOSIS — I10 Essential (primary) hypertension: Secondary | ICD-10-CM | POA: Diagnosis not present

## 2019-04-19 DIAGNOSIS — E7800 Pure hypercholesterolemia, unspecified: Secondary | ICD-10-CM | POA: Diagnosis not present

## 2019-04-19 DIAGNOSIS — Z9181 History of falling: Secondary | ICD-10-CM | POA: Diagnosis not present

## 2019-04-19 DIAGNOSIS — Z794 Long term (current) use of insulin: Secondary | ICD-10-CM | POA: Diagnosis not present

## 2019-04-19 DIAGNOSIS — E11621 Type 2 diabetes mellitus with foot ulcer: Secondary | ICD-10-CM | POA: Diagnosis not present

## 2019-04-19 DIAGNOSIS — E1165 Type 2 diabetes mellitus with hyperglycemia: Secondary | ICD-10-CM | POA: Diagnosis not present

## 2019-04-19 DIAGNOSIS — L97412 Non-pressure chronic ulcer of right heel and midfoot with fat layer exposed: Secondary | ICD-10-CM | POA: Diagnosis not present

## 2019-04-21 DIAGNOSIS — L97522 Non-pressure chronic ulcer of other part of left foot with fat layer exposed: Secondary | ICD-10-CM | POA: Diagnosis not present

## 2019-04-21 DIAGNOSIS — J449 Chronic obstructive pulmonary disease, unspecified: Secondary | ICD-10-CM | POA: Diagnosis not present

## 2019-04-21 DIAGNOSIS — E7800 Pure hypercholesterolemia, unspecified: Secondary | ICD-10-CM | POA: Diagnosis not present

## 2019-04-21 DIAGNOSIS — E11621 Type 2 diabetes mellitus with foot ulcer: Secondary | ICD-10-CM | POA: Diagnosis not present

## 2019-04-21 DIAGNOSIS — Z794 Long term (current) use of insulin: Secondary | ICD-10-CM | POA: Diagnosis not present

## 2019-04-21 DIAGNOSIS — Z9181 History of falling: Secondary | ICD-10-CM | POA: Diagnosis not present

## 2019-04-21 DIAGNOSIS — K219 Gastro-esophageal reflux disease without esophagitis: Secondary | ICD-10-CM | POA: Diagnosis not present

## 2019-04-21 DIAGNOSIS — D649 Anemia, unspecified: Secondary | ICD-10-CM | POA: Diagnosis not present

## 2019-04-21 DIAGNOSIS — E1142 Type 2 diabetes mellitus with diabetic polyneuropathy: Secondary | ICD-10-CM | POA: Diagnosis not present

## 2019-04-21 DIAGNOSIS — Z79891 Long term (current) use of opiate analgesic: Secondary | ICD-10-CM | POA: Diagnosis not present

## 2019-04-21 DIAGNOSIS — E612 Magnesium deficiency: Secondary | ICD-10-CM | POA: Diagnosis not present

## 2019-04-21 DIAGNOSIS — E559 Vitamin D deficiency, unspecified: Secondary | ICD-10-CM | POA: Diagnosis not present

## 2019-04-21 DIAGNOSIS — E1165 Type 2 diabetes mellitus with hyperglycemia: Secondary | ICD-10-CM | POA: Diagnosis not present

## 2019-04-21 DIAGNOSIS — I10 Essential (primary) hypertension: Secondary | ICD-10-CM | POA: Diagnosis not present

## 2019-04-21 DIAGNOSIS — Z9981 Dependence on supplemental oxygen: Secondary | ICD-10-CM | POA: Diagnosis not present

## 2019-04-21 DIAGNOSIS — L97412 Non-pressure chronic ulcer of right heel and midfoot with fat layer exposed: Secondary | ICD-10-CM | POA: Diagnosis not present

## 2019-04-22 DIAGNOSIS — J449 Chronic obstructive pulmonary disease, unspecified: Secondary | ICD-10-CM | POA: Diagnosis not present

## 2019-04-24 DIAGNOSIS — T8189XA Other complications of procedures, not elsewhere classified, initial encounter: Secondary | ICD-10-CM | POA: Diagnosis not present

## 2019-04-24 DIAGNOSIS — E11621 Type 2 diabetes mellitus with foot ulcer: Secondary | ICD-10-CM | POA: Diagnosis not present

## 2019-04-24 DIAGNOSIS — L97522 Non-pressure chronic ulcer of other part of left foot with fat layer exposed: Secondary | ICD-10-CM | POA: Diagnosis not present

## 2019-04-24 DIAGNOSIS — L97409 Non-pressure chronic ulcer of unspecified heel and midfoot with unspecified severity: Secondary | ICD-10-CM | POA: Diagnosis not present

## 2019-04-24 DIAGNOSIS — L97412 Non-pressure chronic ulcer of right heel and midfoot with fat layer exposed: Secondary | ICD-10-CM | POA: Diagnosis not present

## 2019-04-24 DIAGNOSIS — S91102A Unspecified open wound of left great toe without damage to nail, initial encounter: Secondary | ICD-10-CM | POA: Diagnosis not present

## 2019-04-24 DIAGNOSIS — E08621 Diabetes mellitus due to underlying condition with foot ulcer: Secondary | ICD-10-CM | POA: Diagnosis not present

## 2019-04-24 DIAGNOSIS — L97521 Non-pressure chronic ulcer of other part of left foot limited to breakdown of skin: Secondary | ICD-10-CM | POA: Diagnosis not present

## 2019-04-26 DIAGNOSIS — Z9181 History of falling: Secondary | ICD-10-CM | POA: Diagnosis not present

## 2019-04-26 DIAGNOSIS — E7800 Pure hypercholesterolemia, unspecified: Secondary | ICD-10-CM | POA: Diagnosis not present

## 2019-04-26 DIAGNOSIS — E1142 Type 2 diabetes mellitus with diabetic polyneuropathy: Secondary | ICD-10-CM | POA: Diagnosis not present

## 2019-04-26 DIAGNOSIS — L97522 Non-pressure chronic ulcer of other part of left foot with fat layer exposed: Secondary | ICD-10-CM | POA: Diagnosis not present

## 2019-04-26 DIAGNOSIS — L97412 Non-pressure chronic ulcer of right heel and midfoot with fat layer exposed: Secondary | ICD-10-CM | POA: Diagnosis not present

## 2019-04-26 DIAGNOSIS — Z1389 Encounter for screening for other disorder: Secondary | ICD-10-CM | POA: Diagnosis not present

## 2019-04-26 DIAGNOSIS — J449 Chronic obstructive pulmonary disease, unspecified: Secondary | ICD-10-CM | POA: Diagnosis not present

## 2019-04-26 DIAGNOSIS — M47816 Spondylosis without myelopathy or radiculopathy, lumbar region: Secondary | ICD-10-CM | POA: Diagnosis not present

## 2019-04-26 DIAGNOSIS — K219 Gastro-esophageal reflux disease without esophagitis: Secondary | ICD-10-CM | POA: Diagnosis not present

## 2019-04-26 DIAGNOSIS — Z9981 Dependence on supplemental oxygen: Secondary | ICD-10-CM | POA: Diagnosis not present

## 2019-04-26 DIAGNOSIS — Z794 Long term (current) use of insulin: Secondary | ICD-10-CM | POA: Diagnosis not present

## 2019-04-26 DIAGNOSIS — E559 Vitamin D deficiency, unspecified: Secondary | ICD-10-CM | POA: Diagnosis not present

## 2019-04-26 DIAGNOSIS — G894 Chronic pain syndrome: Secondary | ICD-10-CM | POA: Diagnosis not present

## 2019-04-26 DIAGNOSIS — E612 Magnesium deficiency: Secondary | ICD-10-CM | POA: Diagnosis not present

## 2019-04-26 DIAGNOSIS — E11621 Type 2 diabetes mellitus with foot ulcer: Secondary | ICD-10-CM | POA: Diagnosis not present

## 2019-04-26 DIAGNOSIS — I10 Essential (primary) hypertension: Secondary | ICD-10-CM | POA: Diagnosis not present

## 2019-04-26 DIAGNOSIS — E1165 Type 2 diabetes mellitus with hyperglycemia: Secondary | ICD-10-CM | POA: Diagnosis not present

## 2019-04-26 DIAGNOSIS — D649 Anemia, unspecified: Secondary | ICD-10-CM | POA: Diagnosis not present

## 2019-04-26 DIAGNOSIS — Z79891 Long term (current) use of opiate analgesic: Secondary | ICD-10-CM | POA: Diagnosis not present

## 2019-04-27 DIAGNOSIS — E612 Magnesium deficiency: Secondary | ICD-10-CM | POA: Diagnosis not present

## 2019-04-27 DIAGNOSIS — J449 Chronic obstructive pulmonary disease, unspecified: Secondary | ICD-10-CM | POA: Diagnosis not present

## 2019-04-27 DIAGNOSIS — K219 Gastro-esophageal reflux disease without esophagitis: Secondary | ICD-10-CM | POA: Diagnosis not present

## 2019-04-27 DIAGNOSIS — E7800 Pure hypercholesterolemia, unspecified: Secondary | ICD-10-CM | POA: Diagnosis not present

## 2019-04-27 DIAGNOSIS — E559 Vitamin D deficiency, unspecified: Secondary | ICD-10-CM | POA: Diagnosis not present

## 2019-04-27 DIAGNOSIS — I10 Essential (primary) hypertension: Secondary | ICD-10-CM | POA: Diagnosis not present

## 2019-04-27 DIAGNOSIS — E1140 Type 2 diabetes mellitus with diabetic neuropathy, unspecified: Secondary | ICD-10-CM | POA: Diagnosis not present

## 2019-04-28 DIAGNOSIS — Z9181 History of falling: Secondary | ICD-10-CM | POA: Diagnosis not present

## 2019-04-28 DIAGNOSIS — E1165 Type 2 diabetes mellitus with hyperglycemia: Secondary | ICD-10-CM | POA: Diagnosis not present

## 2019-04-28 DIAGNOSIS — Z79891 Long term (current) use of opiate analgesic: Secondary | ICD-10-CM | POA: Diagnosis not present

## 2019-04-28 DIAGNOSIS — L97412 Non-pressure chronic ulcer of right heel and midfoot with fat layer exposed: Secondary | ICD-10-CM | POA: Diagnosis not present

## 2019-04-28 DIAGNOSIS — I10 Essential (primary) hypertension: Secondary | ICD-10-CM | POA: Diagnosis not present

## 2019-04-28 DIAGNOSIS — L97522 Non-pressure chronic ulcer of other part of left foot with fat layer exposed: Secondary | ICD-10-CM | POA: Diagnosis not present

## 2019-04-28 DIAGNOSIS — J449 Chronic obstructive pulmonary disease, unspecified: Secondary | ICD-10-CM | POA: Diagnosis not present

## 2019-04-28 DIAGNOSIS — K219 Gastro-esophageal reflux disease without esophagitis: Secondary | ICD-10-CM | POA: Diagnosis not present

## 2019-04-28 DIAGNOSIS — Z9981 Dependence on supplemental oxygen: Secondary | ICD-10-CM | POA: Diagnosis not present

## 2019-04-28 DIAGNOSIS — E11621 Type 2 diabetes mellitus with foot ulcer: Secondary | ICD-10-CM | POA: Diagnosis not present

## 2019-04-28 DIAGNOSIS — E7800 Pure hypercholesterolemia, unspecified: Secondary | ICD-10-CM | POA: Diagnosis not present

## 2019-04-28 DIAGNOSIS — E612 Magnesium deficiency: Secondary | ICD-10-CM | POA: Diagnosis not present

## 2019-04-28 DIAGNOSIS — E1142 Type 2 diabetes mellitus with diabetic polyneuropathy: Secondary | ICD-10-CM | POA: Diagnosis not present

## 2019-04-28 DIAGNOSIS — E559 Vitamin D deficiency, unspecified: Secondary | ICD-10-CM | POA: Diagnosis not present

## 2019-04-28 DIAGNOSIS — Z794 Long term (current) use of insulin: Secondary | ICD-10-CM | POA: Diagnosis not present

## 2019-04-28 DIAGNOSIS — L0390 Cellulitis, unspecified: Secondary | ICD-10-CM | POA: Diagnosis not present

## 2019-04-28 DIAGNOSIS — D649 Anemia, unspecified: Secondary | ICD-10-CM | POA: Diagnosis not present

## 2019-05-01 DIAGNOSIS — E11621 Type 2 diabetes mellitus with foot ulcer: Secondary | ICD-10-CM | POA: Diagnosis not present

## 2019-05-01 DIAGNOSIS — S91301A Unspecified open wound, right foot, initial encounter: Secondary | ICD-10-CM | POA: Diagnosis not present

## 2019-05-01 DIAGNOSIS — L97522 Non-pressure chronic ulcer of other part of left foot with fat layer exposed: Secondary | ICD-10-CM | POA: Diagnosis not present

## 2019-05-01 DIAGNOSIS — S91102A Unspecified open wound of left great toe without damage to nail, initial encounter: Secondary | ICD-10-CM | POA: Diagnosis not present

## 2019-05-01 DIAGNOSIS — T8189XA Other complications of procedures, not elsewhere classified, initial encounter: Secondary | ICD-10-CM | POA: Diagnosis not present

## 2019-05-01 DIAGNOSIS — L97412 Non-pressure chronic ulcer of right heel and midfoot with fat layer exposed: Secondary | ICD-10-CM | POA: Diagnosis not present

## 2019-05-03 DIAGNOSIS — E1165 Type 2 diabetes mellitus with hyperglycemia: Secondary | ICD-10-CM | POA: Diagnosis not present

## 2019-05-03 DIAGNOSIS — Z794 Long term (current) use of insulin: Secondary | ICD-10-CM | POA: Diagnosis not present

## 2019-05-03 DIAGNOSIS — E1142 Type 2 diabetes mellitus with diabetic polyneuropathy: Secondary | ICD-10-CM | POA: Diagnosis not present

## 2019-05-03 DIAGNOSIS — E612 Magnesium deficiency: Secondary | ICD-10-CM | POA: Diagnosis not present

## 2019-05-03 DIAGNOSIS — J449 Chronic obstructive pulmonary disease, unspecified: Secondary | ICD-10-CM | POA: Diagnosis not present

## 2019-05-03 DIAGNOSIS — I10 Essential (primary) hypertension: Secondary | ICD-10-CM | POA: Diagnosis not present

## 2019-05-03 DIAGNOSIS — D649 Anemia, unspecified: Secondary | ICD-10-CM | POA: Diagnosis not present

## 2019-05-03 DIAGNOSIS — K219 Gastro-esophageal reflux disease without esophagitis: Secondary | ICD-10-CM | POA: Diagnosis not present

## 2019-05-03 DIAGNOSIS — E559 Vitamin D deficiency, unspecified: Secondary | ICD-10-CM | POA: Diagnosis not present

## 2019-05-03 DIAGNOSIS — E11621 Type 2 diabetes mellitus with foot ulcer: Secondary | ICD-10-CM | POA: Diagnosis not present

## 2019-05-03 DIAGNOSIS — L97412 Non-pressure chronic ulcer of right heel and midfoot with fat layer exposed: Secondary | ICD-10-CM | POA: Diagnosis not present

## 2019-05-03 DIAGNOSIS — L97522 Non-pressure chronic ulcer of other part of left foot with fat layer exposed: Secondary | ICD-10-CM | POA: Diagnosis not present

## 2019-05-03 DIAGNOSIS — Z9981 Dependence on supplemental oxygen: Secondary | ICD-10-CM | POA: Diagnosis not present

## 2019-05-03 DIAGNOSIS — Z79891 Long term (current) use of opiate analgesic: Secondary | ICD-10-CM | POA: Diagnosis not present

## 2019-05-03 DIAGNOSIS — Z9181 History of falling: Secondary | ICD-10-CM | POA: Diagnosis not present

## 2019-05-03 DIAGNOSIS — E7800 Pure hypercholesterolemia, unspecified: Secondary | ICD-10-CM | POA: Diagnosis not present

## 2019-05-05 DIAGNOSIS — Z9981 Dependence on supplemental oxygen: Secondary | ICD-10-CM | POA: Diagnosis not present

## 2019-05-05 DIAGNOSIS — K219 Gastro-esophageal reflux disease without esophagitis: Secondary | ICD-10-CM | POA: Diagnosis not present

## 2019-05-05 DIAGNOSIS — D649 Anemia, unspecified: Secondary | ICD-10-CM | POA: Diagnosis not present

## 2019-05-05 DIAGNOSIS — J449 Chronic obstructive pulmonary disease, unspecified: Secondary | ICD-10-CM | POA: Diagnosis not present

## 2019-05-05 DIAGNOSIS — Z9181 History of falling: Secondary | ICD-10-CM | POA: Diagnosis not present

## 2019-05-05 DIAGNOSIS — E11621 Type 2 diabetes mellitus with foot ulcer: Secondary | ICD-10-CM | POA: Diagnosis not present

## 2019-05-05 DIAGNOSIS — Z794 Long term (current) use of insulin: Secondary | ICD-10-CM | POA: Diagnosis not present

## 2019-05-05 DIAGNOSIS — E1165 Type 2 diabetes mellitus with hyperglycemia: Secondary | ICD-10-CM | POA: Diagnosis not present

## 2019-05-05 DIAGNOSIS — L97412 Non-pressure chronic ulcer of right heel and midfoot with fat layer exposed: Secondary | ICD-10-CM | POA: Diagnosis not present

## 2019-05-05 DIAGNOSIS — E559 Vitamin D deficiency, unspecified: Secondary | ICD-10-CM | POA: Diagnosis not present

## 2019-05-05 DIAGNOSIS — E612 Magnesium deficiency: Secondary | ICD-10-CM | POA: Diagnosis not present

## 2019-05-05 DIAGNOSIS — Z79891 Long term (current) use of opiate analgesic: Secondary | ICD-10-CM | POA: Diagnosis not present

## 2019-05-05 DIAGNOSIS — L97522 Non-pressure chronic ulcer of other part of left foot with fat layer exposed: Secondary | ICD-10-CM | POA: Diagnosis not present

## 2019-05-05 DIAGNOSIS — I10 Essential (primary) hypertension: Secondary | ICD-10-CM | POA: Diagnosis not present

## 2019-05-05 DIAGNOSIS — E7800 Pure hypercholesterolemia, unspecified: Secondary | ICD-10-CM | POA: Diagnosis not present

## 2019-05-05 DIAGNOSIS — E1142 Type 2 diabetes mellitus with diabetic polyneuropathy: Secondary | ICD-10-CM | POA: Diagnosis not present

## 2019-05-08 DIAGNOSIS — L97412 Non-pressure chronic ulcer of right heel and midfoot with fat layer exposed: Secondary | ICD-10-CM | POA: Diagnosis not present

## 2019-05-08 DIAGNOSIS — S91301A Unspecified open wound, right foot, initial encounter: Secondary | ICD-10-CM | POA: Diagnosis not present

## 2019-05-08 DIAGNOSIS — T8189XA Other complications of procedures, not elsewhere classified, initial encounter: Secondary | ICD-10-CM | POA: Diagnosis not present

## 2019-05-08 DIAGNOSIS — E11621 Type 2 diabetes mellitus with foot ulcer: Secondary | ICD-10-CM | POA: Diagnosis not present

## 2019-05-08 DIAGNOSIS — S91102A Unspecified open wound of left great toe without damage to nail, initial encounter: Secondary | ICD-10-CM | POA: Diagnosis not present

## 2019-05-08 DIAGNOSIS — L97522 Non-pressure chronic ulcer of other part of left foot with fat layer exposed: Secondary | ICD-10-CM | POA: Diagnosis not present

## 2019-05-10 DIAGNOSIS — Z794 Long term (current) use of insulin: Secondary | ICD-10-CM | POA: Diagnosis not present

## 2019-05-10 DIAGNOSIS — L97412 Non-pressure chronic ulcer of right heel and midfoot with fat layer exposed: Secondary | ICD-10-CM | POA: Diagnosis not present

## 2019-05-10 DIAGNOSIS — I10 Essential (primary) hypertension: Secondary | ICD-10-CM | POA: Diagnosis not present

## 2019-05-10 DIAGNOSIS — E612 Magnesium deficiency: Secondary | ICD-10-CM | POA: Diagnosis not present

## 2019-05-10 DIAGNOSIS — L97522 Non-pressure chronic ulcer of other part of left foot with fat layer exposed: Secondary | ICD-10-CM | POA: Diagnosis not present

## 2019-05-10 DIAGNOSIS — E7800 Pure hypercholesterolemia, unspecified: Secondary | ICD-10-CM | POA: Diagnosis not present

## 2019-05-10 DIAGNOSIS — E559 Vitamin D deficiency, unspecified: Secondary | ICD-10-CM | POA: Diagnosis not present

## 2019-05-10 DIAGNOSIS — Z9981 Dependence on supplemental oxygen: Secondary | ICD-10-CM | POA: Diagnosis not present

## 2019-05-10 DIAGNOSIS — E11621 Type 2 diabetes mellitus with foot ulcer: Secondary | ICD-10-CM | POA: Diagnosis not present

## 2019-05-10 DIAGNOSIS — J449 Chronic obstructive pulmonary disease, unspecified: Secondary | ICD-10-CM | POA: Diagnosis not present

## 2019-05-10 DIAGNOSIS — Z79891 Long term (current) use of opiate analgesic: Secondary | ICD-10-CM | POA: Diagnosis not present

## 2019-05-10 DIAGNOSIS — K219 Gastro-esophageal reflux disease without esophagitis: Secondary | ICD-10-CM | POA: Diagnosis not present

## 2019-05-10 DIAGNOSIS — E1165 Type 2 diabetes mellitus with hyperglycemia: Secondary | ICD-10-CM | POA: Diagnosis not present

## 2019-05-10 DIAGNOSIS — Z9181 History of falling: Secondary | ICD-10-CM | POA: Diagnosis not present

## 2019-05-10 DIAGNOSIS — D649 Anemia, unspecified: Secondary | ICD-10-CM | POA: Diagnosis not present

## 2019-05-10 DIAGNOSIS — E1142 Type 2 diabetes mellitus with diabetic polyneuropathy: Secondary | ICD-10-CM | POA: Diagnosis not present

## 2019-05-12 DIAGNOSIS — Z9981 Dependence on supplemental oxygen: Secondary | ICD-10-CM | POA: Diagnosis not present

## 2019-05-12 DIAGNOSIS — I10 Essential (primary) hypertension: Secondary | ICD-10-CM | POA: Diagnosis not present

## 2019-05-12 DIAGNOSIS — Z1231 Encounter for screening mammogram for malignant neoplasm of breast: Secondary | ICD-10-CM | POA: Diagnosis not present

## 2019-05-12 DIAGNOSIS — E785 Hyperlipidemia, unspecified: Secondary | ICD-10-CM | POA: Diagnosis not present

## 2019-05-12 DIAGNOSIS — D649 Anemia, unspecified: Secondary | ICD-10-CM | POA: Diagnosis not present

## 2019-05-12 DIAGNOSIS — E612 Magnesium deficiency: Secondary | ICD-10-CM | POA: Diagnosis not present

## 2019-05-12 DIAGNOSIS — L97412 Non-pressure chronic ulcer of right heel and midfoot with fat layer exposed: Secondary | ICD-10-CM | POA: Diagnosis not present

## 2019-05-12 DIAGNOSIS — Z79891 Long term (current) use of opiate analgesic: Secondary | ICD-10-CM | POA: Diagnosis not present

## 2019-05-12 DIAGNOSIS — E11621 Type 2 diabetes mellitus with foot ulcer: Secondary | ICD-10-CM | POA: Diagnosis not present

## 2019-05-12 DIAGNOSIS — E1142 Type 2 diabetes mellitus with diabetic polyneuropathy: Secondary | ICD-10-CM | POA: Diagnosis not present

## 2019-05-12 DIAGNOSIS — E1165 Type 2 diabetes mellitus with hyperglycemia: Secondary | ICD-10-CM | POA: Diagnosis not present

## 2019-05-12 DIAGNOSIS — J449 Chronic obstructive pulmonary disease, unspecified: Secondary | ICD-10-CM | POA: Diagnosis not present

## 2019-05-12 DIAGNOSIS — Z9181 History of falling: Secondary | ICD-10-CM | POA: Diagnosis not present

## 2019-05-12 DIAGNOSIS — Z794 Long term (current) use of insulin: Secondary | ICD-10-CM | POA: Diagnosis not present

## 2019-05-12 DIAGNOSIS — L97522 Non-pressure chronic ulcer of other part of left foot with fat layer exposed: Secondary | ICD-10-CM | POA: Diagnosis not present

## 2019-05-12 DIAGNOSIS — E7800 Pure hypercholesterolemia, unspecified: Secondary | ICD-10-CM | POA: Diagnosis not present

## 2019-05-12 DIAGNOSIS — K219 Gastro-esophageal reflux disease without esophagitis: Secondary | ICD-10-CM | POA: Diagnosis not present

## 2019-05-12 DIAGNOSIS — Z0000 Encounter for general adult medical examination without abnormal findings: Secondary | ICD-10-CM | POA: Diagnosis not present

## 2019-05-12 DIAGNOSIS — E559 Vitamin D deficiency, unspecified: Secondary | ICD-10-CM | POA: Diagnosis not present

## 2019-05-15 DIAGNOSIS — E11621 Type 2 diabetes mellitus with foot ulcer: Secondary | ICD-10-CM | POA: Diagnosis not present

## 2019-05-15 DIAGNOSIS — L97412 Non-pressure chronic ulcer of right heel and midfoot with fat layer exposed: Secondary | ICD-10-CM | POA: Diagnosis not present

## 2019-05-15 DIAGNOSIS — L97522 Non-pressure chronic ulcer of other part of left foot with fat layer exposed: Secondary | ICD-10-CM | POA: Diagnosis not present

## 2019-05-15 DIAGNOSIS — T8189XA Other complications of procedures, not elsewhere classified, initial encounter: Secondary | ICD-10-CM | POA: Diagnosis not present

## 2019-05-15 DIAGNOSIS — S91102A Unspecified open wound of left great toe without damage to nail, initial encounter: Secondary | ICD-10-CM | POA: Diagnosis not present

## 2019-05-15 DIAGNOSIS — S91301A Unspecified open wound, right foot, initial encounter: Secondary | ICD-10-CM | POA: Diagnosis not present

## 2019-05-17 DIAGNOSIS — E1165 Type 2 diabetes mellitus with hyperglycemia: Secondary | ICD-10-CM | POA: Diagnosis not present

## 2019-05-17 DIAGNOSIS — Z79891 Long term (current) use of opiate analgesic: Secondary | ICD-10-CM | POA: Diagnosis not present

## 2019-05-17 DIAGNOSIS — D649 Anemia, unspecified: Secondary | ICD-10-CM | POA: Diagnosis not present

## 2019-05-17 DIAGNOSIS — Z9181 History of falling: Secondary | ICD-10-CM | POA: Diagnosis not present

## 2019-05-17 DIAGNOSIS — L97412 Non-pressure chronic ulcer of right heel and midfoot with fat layer exposed: Secondary | ICD-10-CM | POA: Diagnosis not present

## 2019-05-17 DIAGNOSIS — K219 Gastro-esophageal reflux disease without esophagitis: Secondary | ICD-10-CM | POA: Diagnosis not present

## 2019-05-17 DIAGNOSIS — E11621 Type 2 diabetes mellitus with foot ulcer: Secondary | ICD-10-CM | POA: Diagnosis not present

## 2019-05-17 DIAGNOSIS — J449 Chronic obstructive pulmonary disease, unspecified: Secondary | ICD-10-CM | POA: Diagnosis not present

## 2019-05-17 DIAGNOSIS — E7800 Pure hypercholesterolemia, unspecified: Secondary | ICD-10-CM | POA: Diagnosis not present

## 2019-05-17 DIAGNOSIS — Z794 Long term (current) use of insulin: Secondary | ICD-10-CM | POA: Diagnosis not present

## 2019-05-17 DIAGNOSIS — E1142 Type 2 diabetes mellitus with diabetic polyneuropathy: Secondary | ICD-10-CM | POA: Diagnosis not present

## 2019-05-17 DIAGNOSIS — E559 Vitamin D deficiency, unspecified: Secondary | ICD-10-CM | POA: Diagnosis not present

## 2019-05-17 DIAGNOSIS — E612 Magnesium deficiency: Secondary | ICD-10-CM | POA: Diagnosis not present

## 2019-05-17 DIAGNOSIS — Z9981 Dependence on supplemental oxygen: Secondary | ICD-10-CM | POA: Diagnosis not present

## 2019-05-17 DIAGNOSIS — I10 Essential (primary) hypertension: Secondary | ICD-10-CM | POA: Diagnosis not present

## 2019-05-17 DIAGNOSIS — L97522 Non-pressure chronic ulcer of other part of left foot with fat layer exposed: Secondary | ICD-10-CM | POA: Diagnosis not present

## 2019-05-19 DIAGNOSIS — E11621 Type 2 diabetes mellitus with foot ulcer: Secondary | ICD-10-CM | POA: Diagnosis not present

## 2019-05-19 DIAGNOSIS — Z9981 Dependence on supplemental oxygen: Secondary | ICD-10-CM | POA: Diagnosis not present

## 2019-05-19 DIAGNOSIS — E7800 Pure hypercholesterolemia, unspecified: Secondary | ICD-10-CM | POA: Diagnosis not present

## 2019-05-19 DIAGNOSIS — E612 Magnesium deficiency: Secondary | ICD-10-CM | POA: Diagnosis not present

## 2019-05-19 DIAGNOSIS — L97522 Non-pressure chronic ulcer of other part of left foot with fat layer exposed: Secondary | ICD-10-CM | POA: Diagnosis not present

## 2019-05-19 DIAGNOSIS — Z79891 Long term (current) use of opiate analgesic: Secondary | ICD-10-CM | POA: Diagnosis not present

## 2019-05-19 DIAGNOSIS — E1165 Type 2 diabetes mellitus with hyperglycemia: Secondary | ICD-10-CM | POA: Diagnosis not present

## 2019-05-19 DIAGNOSIS — L97412 Non-pressure chronic ulcer of right heel and midfoot with fat layer exposed: Secondary | ICD-10-CM | POA: Diagnosis not present

## 2019-05-19 DIAGNOSIS — J449 Chronic obstructive pulmonary disease, unspecified: Secondary | ICD-10-CM | POA: Diagnosis not present

## 2019-05-19 DIAGNOSIS — Z9181 History of falling: Secondary | ICD-10-CM | POA: Diagnosis not present

## 2019-05-19 DIAGNOSIS — I10 Essential (primary) hypertension: Secondary | ICD-10-CM | POA: Diagnosis not present

## 2019-05-19 DIAGNOSIS — E1142 Type 2 diabetes mellitus with diabetic polyneuropathy: Secondary | ICD-10-CM | POA: Diagnosis not present

## 2019-05-19 DIAGNOSIS — E559 Vitamin D deficiency, unspecified: Secondary | ICD-10-CM | POA: Diagnosis not present

## 2019-05-19 DIAGNOSIS — Z794 Long term (current) use of insulin: Secondary | ICD-10-CM | POA: Diagnosis not present

## 2019-05-19 DIAGNOSIS — K219 Gastro-esophageal reflux disease without esophagitis: Secondary | ICD-10-CM | POA: Diagnosis not present

## 2019-05-19 DIAGNOSIS — D649 Anemia, unspecified: Secondary | ICD-10-CM | POA: Diagnosis not present

## 2019-05-22 DIAGNOSIS — S91301A Unspecified open wound, right foot, initial encounter: Secondary | ICD-10-CM | POA: Diagnosis not present

## 2019-05-22 DIAGNOSIS — T8189XA Other complications of procedures, not elsewhere classified, initial encounter: Secondary | ICD-10-CM | POA: Diagnosis not present

## 2019-05-22 DIAGNOSIS — L97412 Non-pressure chronic ulcer of right heel and midfoot with fat layer exposed: Secondary | ICD-10-CM | POA: Diagnosis not present

## 2019-05-22 DIAGNOSIS — L97522 Non-pressure chronic ulcer of other part of left foot with fat layer exposed: Secondary | ICD-10-CM | POA: Diagnosis not present

## 2019-05-22 DIAGNOSIS — E11621 Type 2 diabetes mellitus with foot ulcer: Secondary | ICD-10-CM | POA: Diagnosis not present

## 2019-05-22 DIAGNOSIS — S91102A Unspecified open wound of left great toe without damage to nail, initial encounter: Secondary | ICD-10-CM | POA: Diagnosis not present

## 2019-05-23 DIAGNOSIS — J449 Chronic obstructive pulmonary disease, unspecified: Secondary | ICD-10-CM | POA: Diagnosis not present

## 2019-05-24 DIAGNOSIS — G894 Chronic pain syndrome: Secondary | ICD-10-CM | POA: Diagnosis not present

## 2019-05-24 DIAGNOSIS — L97412 Non-pressure chronic ulcer of right heel and midfoot with fat layer exposed: Secondary | ICD-10-CM | POA: Diagnosis not present

## 2019-05-24 DIAGNOSIS — Z79891 Long term (current) use of opiate analgesic: Secondary | ICD-10-CM | POA: Diagnosis not present

## 2019-05-24 DIAGNOSIS — E7800 Pure hypercholesterolemia, unspecified: Secondary | ICD-10-CM | POA: Diagnosis not present

## 2019-05-24 DIAGNOSIS — Z794 Long term (current) use of insulin: Secondary | ICD-10-CM | POA: Diagnosis not present

## 2019-05-24 DIAGNOSIS — I10 Essential (primary) hypertension: Secondary | ICD-10-CM | POA: Diagnosis not present

## 2019-05-24 DIAGNOSIS — D649 Anemia, unspecified: Secondary | ICD-10-CM | POA: Diagnosis not present

## 2019-05-24 DIAGNOSIS — E612 Magnesium deficiency: Secondary | ICD-10-CM | POA: Diagnosis not present

## 2019-05-24 DIAGNOSIS — E559 Vitamin D deficiency, unspecified: Secondary | ICD-10-CM | POA: Diagnosis not present

## 2019-05-24 DIAGNOSIS — E11621 Type 2 diabetes mellitus with foot ulcer: Secondary | ICD-10-CM | POA: Diagnosis not present

## 2019-05-24 DIAGNOSIS — L97522 Non-pressure chronic ulcer of other part of left foot with fat layer exposed: Secondary | ICD-10-CM | POA: Diagnosis not present

## 2019-05-24 DIAGNOSIS — K219 Gastro-esophageal reflux disease without esophagitis: Secondary | ICD-10-CM | POA: Diagnosis not present

## 2019-05-24 DIAGNOSIS — J449 Chronic obstructive pulmonary disease, unspecified: Secondary | ICD-10-CM | POA: Diagnosis not present

## 2019-05-24 DIAGNOSIS — Z9181 History of falling: Secondary | ICD-10-CM | POA: Diagnosis not present

## 2019-05-24 DIAGNOSIS — Z9981 Dependence on supplemental oxygen: Secondary | ICD-10-CM | POA: Diagnosis not present

## 2019-05-24 DIAGNOSIS — E1165 Type 2 diabetes mellitus with hyperglycemia: Secondary | ICD-10-CM | POA: Diagnosis not present

## 2019-05-24 DIAGNOSIS — M47816 Spondylosis without myelopathy or radiculopathy, lumbar region: Secondary | ICD-10-CM | POA: Diagnosis not present

## 2019-05-24 DIAGNOSIS — E1142 Type 2 diabetes mellitus with diabetic polyneuropathy: Secondary | ICD-10-CM | POA: Diagnosis not present

## 2019-05-25 DIAGNOSIS — E08621 Diabetes mellitus due to underlying condition with foot ulcer: Secondary | ICD-10-CM | POA: Diagnosis not present

## 2019-05-25 DIAGNOSIS — L97409 Non-pressure chronic ulcer of unspecified heel and midfoot with unspecified severity: Secondary | ICD-10-CM | POA: Diagnosis not present

## 2019-05-25 DIAGNOSIS — E11621 Type 2 diabetes mellitus with foot ulcer: Secondary | ICD-10-CM | POA: Diagnosis not present

## 2019-05-25 DIAGNOSIS — L97521 Non-pressure chronic ulcer of other part of left foot limited to breakdown of skin: Secondary | ICD-10-CM | POA: Diagnosis not present

## 2019-05-26 DIAGNOSIS — D649 Anemia, unspecified: Secondary | ICD-10-CM | POA: Diagnosis not present

## 2019-05-26 DIAGNOSIS — E7800 Pure hypercholesterolemia, unspecified: Secondary | ICD-10-CM | POA: Diagnosis not present

## 2019-05-26 DIAGNOSIS — E612 Magnesium deficiency: Secondary | ICD-10-CM | POA: Diagnosis not present

## 2019-05-26 DIAGNOSIS — K219 Gastro-esophageal reflux disease without esophagitis: Secondary | ICD-10-CM | POA: Diagnosis not present

## 2019-05-26 DIAGNOSIS — Z794 Long term (current) use of insulin: Secondary | ICD-10-CM | POA: Diagnosis not present

## 2019-05-26 DIAGNOSIS — Z9181 History of falling: Secondary | ICD-10-CM | POA: Diagnosis not present

## 2019-05-26 DIAGNOSIS — L97522 Non-pressure chronic ulcer of other part of left foot with fat layer exposed: Secondary | ICD-10-CM | POA: Diagnosis not present

## 2019-05-26 DIAGNOSIS — E1142 Type 2 diabetes mellitus with diabetic polyneuropathy: Secondary | ICD-10-CM | POA: Diagnosis not present

## 2019-05-26 DIAGNOSIS — Z9981 Dependence on supplemental oxygen: Secondary | ICD-10-CM | POA: Diagnosis not present

## 2019-05-26 DIAGNOSIS — E1165 Type 2 diabetes mellitus with hyperglycemia: Secondary | ICD-10-CM | POA: Diagnosis not present

## 2019-05-26 DIAGNOSIS — Z79891 Long term (current) use of opiate analgesic: Secondary | ICD-10-CM | POA: Diagnosis not present

## 2019-05-26 DIAGNOSIS — I10 Essential (primary) hypertension: Secondary | ICD-10-CM | POA: Diagnosis not present

## 2019-05-26 DIAGNOSIS — J449 Chronic obstructive pulmonary disease, unspecified: Secondary | ICD-10-CM | POA: Diagnosis not present

## 2019-05-26 DIAGNOSIS — E11621 Type 2 diabetes mellitus with foot ulcer: Secondary | ICD-10-CM | POA: Diagnosis not present

## 2019-05-26 DIAGNOSIS — E559 Vitamin D deficiency, unspecified: Secondary | ICD-10-CM | POA: Diagnosis not present

## 2019-05-26 DIAGNOSIS — L97412 Non-pressure chronic ulcer of right heel and midfoot with fat layer exposed: Secondary | ICD-10-CM | POA: Diagnosis not present

## 2019-05-29 DIAGNOSIS — S91301A Unspecified open wound, right foot, initial encounter: Secondary | ICD-10-CM | POA: Diagnosis not present

## 2019-05-29 DIAGNOSIS — S91102A Unspecified open wound of left great toe without damage to nail, initial encounter: Secondary | ICD-10-CM | POA: Diagnosis not present

## 2019-05-29 DIAGNOSIS — L97522 Non-pressure chronic ulcer of other part of left foot with fat layer exposed: Secondary | ICD-10-CM | POA: Diagnosis not present

## 2019-05-29 DIAGNOSIS — L97412 Non-pressure chronic ulcer of right heel and midfoot with fat layer exposed: Secondary | ICD-10-CM | POA: Diagnosis not present

## 2019-05-29 DIAGNOSIS — T8189XA Other complications of procedures, not elsewhere classified, initial encounter: Secondary | ICD-10-CM | POA: Diagnosis not present

## 2019-05-29 DIAGNOSIS — E11621 Type 2 diabetes mellitus with foot ulcer: Secondary | ICD-10-CM | POA: Diagnosis not present

## 2019-05-31 DIAGNOSIS — J449 Chronic obstructive pulmonary disease, unspecified: Secondary | ICD-10-CM | POA: Diagnosis not present

## 2019-05-31 DIAGNOSIS — E1165 Type 2 diabetes mellitus with hyperglycemia: Secondary | ICD-10-CM | POA: Diagnosis not present

## 2019-05-31 DIAGNOSIS — E11621 Type 2 diabetes mellitus with foot ulcer: Secondary | ICD-10-CM | POA: Diagnosis not present

## 2019-05-31 DIAGNOSIS — Z794 Long term (current) use of insulin: Secondary | ICD-10-CM | POA: Diagnosis not present

## 2019-05-31 DIAGNOSIS — E1142 Type 2 diabetes mellitus with diabetic polyneuropathy: Secondary | ICD-10-CM | POA: Diagnosis not present

## 2019-05-31 DIAGNOSIS — L97522 Non-pressure chronic ulcer of other part of left foot with fat layer exposed: Secondary | ICD-10-CM | POA: Diagnosis not present

## 2019-05-31 DIAGNOSIS — E612 Magnesium deficiency: Secondary | ICD-10-CM | POA: Diagnosis not present

## 2019-05-31 DIAGNOSIS — D649 Anemia, unspecified: Secondary | ICD-10-CM | POA: Diagnosis not present

## 2019-05-31 DIAGNOSIS — E559 Vitamin D deficiency, unspecified: Secondary | ICD-10-CM | POA: Diagnosis not present

## 2019-05-31 DIAGNOSIS — Z79891 Long term (current) use of opiate analgesic: Secondary | ICD-10-CM | POA: Diagnosis not present

## 2019-05-31 DIAGNOSIS — I10 Essential (primary) hypertension: Secondary | ICD-10-CM | POA: Diagnosis not present

## 2019-05-31 DIAGNOSIS — K219 Gastro-esophageal reflux disease without esophagitis: Secondary | ICD-10-CM | POA: Diagnosis not present

## 2019-05-31 DIAGNOSIS — L97412 Non-pressure chronic ulcer of right heel and midfoot with fat layer exposed: Secondary | ICD-10-CM | POA: Diagnosis not present

## 2019-05-31 DIAGNOSIS — Z9981 Dependence on supplemental oxygen: Secondary | ICD-10-CM | POA: Diagnosis not present

## 2019-05-31 DIAGNOSIS — E7800 Pure hypercholesterolemia, unspecified: Secondary | ICD-10-CM | POA: Diagnosis not present

## 2019-05-31 DIAGNOSIS — Z9181 History of falling: Secondary | ICD-10-CM | POA: Diagnosis not present

## 2019-06-02 DIAGNOSIS — J449 Chronic obstructive pulmonary disease, unspecified: Secondary | ICD-10-CM | POA: Diagnosis not present

## 2019-06-02 DIAGNOSIS — Z79891 Long term (current) use of opiate analgesic: Secondary | ICD-10-CM | POA: Diagnosis not present

## 2019-06-02 DIAGNOSIS — Z9181 History of falling: Secondary | ICD-10-CM | POA: Diagnosis not present

## 2019-06-02 DIAGNOSIS — I10 Essential (primary) hypertension: Secondary | ICD-10-CM | POA: Diagnosis not present

## 2019-06-02 DIAGNOSIS — L97412 Non-pressure chronic ulcer of right heel and midfoot with fat layer exposed: Secondary | ICD-10-CM | POA: Diagnosis not present

## 2019-06-02 DIAGNOSIS — E1142 Type 2 diabetes mellitus with diabetic polyneuropathy: Secondary | ICD-10-CM | POA: Diagnosis not present

## 2019-06-02 DIAGNOSIS — E559 Vitamin D deficiency, unspecified: Secondary | ICD-10-CM | POA: Diagnosis not present

## 2019-06-02 DIAGNOSIS — E11621 Type 2 diabetes mellitus with foot ulcer: Secondary | ICD-10-CM | POA: Diagnosis not present

## 2019-06-02 DIAGNOSIS — E7800 Pure hypercholesterolemia, unspecified: Secondary | ICD-10-CM | POA: Diagnosis not present

## 2019-06-02 DIAGNOSIS — K219 Gastro-esophageal reflux disease without esophagitis: Secondary | ICD-10-CM | POA: Diagnosis not present

## 2019-06-02 DIAGNOSIS — D649 Anemia, unspecified: Secondary | ICD-10-CM | POA: Diagnosis not present

## 2019-06-02 DIAGNOSIS — E612 Magnesium deficiency: Secondary | ICD-10-CM | POA: Diagnosis not present

## 2019-06-02 DIAGNOSIS — Z794 Long term (current) use of insulin: Secondary | ICD-10-CM | POA: Diagnosis not present

## 2019-06-02 DIAGNOSIS — E1165 Type 2 diabetes mellitus with hyperglycemia: Secondary | ICD-10-CM | POA: Diagnosis not present

## 2019-06-02 DIAGNOSIS — L97522 Non-pressure chronic ulcer of other part of left foot with fat layer exposed: Secondary | ICD-10-CM | POA: Diagnosis not present

## 2019-06-02 DIAGNOSIS — Z9981 Dependence on supplemental oxygen: Secondary | ICD-10-CM | POA: Diagnosis not present

## 2019-06-05 DIAGNOSIS — L97412 Non-pressure chronic ulcer of right heel and midfoot with fat layer exposed: Secondary | ICD-10-CM | POA: Diagnosis not present

## 2019-06-05 DIAGNOSIS — L97522 Non-pressure chronic ulcer of other part of left foot with fat layer exposed: Secondary | ICD-10-CM | POA: Diagnosis not present

## 2019-06-05 DIAGNOSIS — T8189XA Other complications of procedures, not elsewhere classified, initial encounter: Secondary | ICD-10-CM | POA: Diagnosis not present

## 2019-06-05 DIAGNOSIS — E11621 Type 2 diabetes mellitus with foot ulcer: Secondary | ICD-10-CM | POA: Diagnosis not present

## 2019-06-07 DIAGNOSIS — J449 Chronic obstructive pulmonary disease, unspecified: Secondary | ICD-10-CM | POA: Diagnosis not present

## 2019-06-07 DIAGNOSIS — E7800 Pure hypercholesterolemia, unspecified: Secondary | ICD-10-CM | POA: Diagnosis not present

## 2019-06-07 DIAGNOSIS — E11621 Type 2 diabetes mellitus with foot ulcer: Secondary | ICD-10-CM | POA: Diagnosis not present

## 2019-06-07 DIAGNOSIS — D649 Anemia, unspecified: Secondary | ICD-10-CM | POA: Diagnosis not present

## 2019-06-07 DIAGNOSIS — Z9981 Dependence on supplemental oxygen: Secondary | ICD-10-CM | POA: Diagnosis not present

## 2019-06-07 DIAGNOSIS — I10 Essential (primary) hypertension: Secondary | ICD-10-CM | POA: Diagnosis not present

## 2019-06-07 DIAGNOSIS — E559 Vitamin D deficiency, unspecified: Secondary | ICD-10-CM | POA: Diagnosis not present

## 2019-06-07 DIAGNOSIS — E1165 Type 2 diabetes mellitus with hyperglycemia: Secondary | ICD-10-CM | POA: Diagnosis not present

## 2019-06-07 DIAGNOSIS — L97412 Non-pressure chronic ulcer of right heel and midfoot with fat layer exposed: Secondary | ICD-10-CM | POA: Diagnosis not present

## 2019-06-07 DIAGNOSIS — E1142 Type 2 diabetes mellitus with diabetic polyneuropathy: Secondary | ICD-10-CM | POA: Diagnosis not present

## 2019-06-07 DIAGNOSIS — Z79891 Long term (current) use of opiate analgesic: Secondary | ICD-10-CM | POA: Diagnosis not present

## 2019-06-07 DIAGNOSIS — K219 Gastro-esophageal reflux disease without esophagitis: Secondary | ICD-10-CM | POA: Diagnosis not present

## 2019-06-07 DIAGNOSIS — E612 Magnesium deficiency: Secondary | ICD-10-CM | POA: Diagnosis not present

## 2019-06-07 DIAGNOSIS — Z794 Long term (current) use of insulin: Secondary | ICD-10-CM | POA: Diagnosis not present

## 2019-06-07 DIAGNOSIS — L97522 Non-pressure chronic ulcer of other part of left foot with fat layer exposed: Secondary | ICD-10-CM | POA: Diagnosis not present

## 2019-06-07 DIAGNOSIS — Z9181 History of falling: Secondary | ICD-10-CM | POA: Diagnosis not present

## 2019-06-09 DIAGNOSIS — E11621 Type 2 diabetes mellitus with foot ulcer: Secondary | ICD-10-CM | POA: Diagnosis not present

## 2019-06-09 DIAGNOSIS — D649 Anemia, unspecified: Secondary | ICD-10-CM | POA: Diagnosis not present

## 2019-06-09 DIAGNOSIS — I10 Essential (primary) hypertension: Secondary | ICD-10-CM | POA: Diagnosis not present

## 2019-06-09 DIAGNOSIS — Z794 Long term (current) use of insulin: Secondary | ICD-10-CM | POA: Diagnosis not present

## 2019-06-09 DIAGNOSIS — J449 Chronic obstructive pulmonary disease, unspecified: Secondary | ICD-10-CM | POA: Diagnosis not present

## 2019-06-09 DIAGNOSIS — L97412 Non-pressure chronic ulcer of right heel and midfoot with fat layer exposed: Secondary | ICD-10-CM | POA: Diagnosis not present

## 2019-06-09 DIAGNOSIS — E1165 Type 2 diabetes mellitus with hyperglycemia: Secondary | ICD-10-CM | POA: Diagnosis not present

## 2019-06-09 DIAGNOSIS — L97522 Non-pressure chronic ulcer of other part of left foot with fat layer exposed: Secondary | ICD-10-CM | POA: Diagnosis not present

## 2019-06-09 DIAGNOSIS — E612 Magnesium deficiency: Secondary | ICD-10-CM | POA: Diagnosis not present

## 2019-06-09 DIAGNOSIS — Z79891 Long term (current) use of opiate analgesic: Secondary | ICD-10-CM | POA: Diagnosis not present

## 2019-06-09 DIAGNOSIS — E7800 Pure hypercholesterolemia, unspecified: Secondary | ICD-10-CM | POA: Diagnosis not present

## 2019-06-09 DIAGNOSIS — K219 Gastro-esophageal reflux disease without esophagitis: Secondary | ICD-10-CM | POA: Diagnosis not present

## 2019-06-09 DIAGNOSIS — Z9981 Dependence on supplemental oxygen: Secondary | ICD-10-CM | POA: Diagnosis not present

## 2019-06-09 DIAGNOSIS — E1142 Type 2 diabetes mellitus with diabetic polyneuropathy: Secondary | ICD-10-CM | POA: Diagnosis not present

## 2019-06-09 DIAGNOSIS — Z9181 History of falling: Secondary | ICD-10-CM | POA: Diagnosis not present

## 2019-06-09 DIAGNOSIS — E559 Vitamin D deficiency, unspecified: Secondary | ICD-10-CM | POA: Diagnosis not present

## 2019-06-13 DIAGNOSIS — L97419 Non-pressure chronic ulcer of right heel and midfoot with unspecified severity: Secondary | ICD-10-CM | POA: Diagnosis not present

## 2019-06-13 DIAGNOSIS — L97512 Non-pressure chronic ulcer of other part of right foot with fat layer exposed: Secondary | ICD-10-CM | POA: Diagnosis not present

## 2019-06-13 DIAGNOSIS — T8189XA Other complications of procedures, not elsewhere classified, initial encounter: Secondary | ICD-10-CM | POA: Diagnosis not present

## 2019-06-13 DIAGNOSIS — L97522 Non-pressure chronic ulcer of other part of left foot with fat layer exposed: Secondary | ICD-10-CM | POA: Diagnosis not present

## 2019-06-13 DIAGNOSIS — E11621 Type 2 diabetes mellitus with foot ulcer: Secondary | ICD-10-CM | POA: Diagnosis not present

## 2019-06-16 DIAGNOSIS — E7800 Pure hypercholesterolemia, unspecified: Secondary | ICD-10-CM | POA: Diagnosis not present

## 2019-06-16 DIAGNOSIS — E1165 Type 2 diabetes mellitus with hyperglycemia: Secondary | ICD-10-CM | POA: Diagnosis not present

## 2019-06-16 DIAGNOSIS — Z79891 Long term (current) use of opiate analgesic: Secondary | ICD-10-CM | POA: Diagnosis not present

## 2019-06-16 DIAGNOSIS — E11621 Type 2 diabetes mellitus with foot ulcer: Secondary | ICD-10-CM | POA: Diagnosis not present

## 2019-06-16 DIAGNOSIS — Z794 Long term (current) use of insulin: Secondary | ICD-10-CM | POA: Diagnosis not present

## 2019-06-16 DIAGNOSIS — E1142 Type 2 diabetes mellitus with diabetic polyneuropathy: Secondary | ICD-10-CM | POA: Diagnosis not present

## 2019-06-16 DIAGNOSIS — E612 Magnesium deficiency: Secondary | ICD-10-CM | POA: Diagnosis not present

## 2019-06-16 DIAGNOSIS — L97412 Non-pressure chronic ulcer of right heel and midfoot with fat layer exposed: Secondary | ICD-10-CM | POA: Diagnosis not present

## 2019-06-16 DIAGNOSIS — J449 Chronic obstructive pulmonary disease, unspecified: Secondary | ICD-10-CM | POA: Diagnosis not present

## 2019-06-16 DIAGNOSIS — K219 Gastro-esophageal reflux disease without esophagitis: Secondary | ICD-10-CM | POA: Diagnosis not present

## 2019-06-16 DIAGNOSIS — Z9981 Dependence on supplemental oxygen: Secondary | ICD-10-CM | POA: Diagnosis not present

## 2019-06-16 DIAGNOSIS — L97522 Non-pressure chronic ulcer of other part of left foot with fat layer exposed: Secondary | ICD-10-CM | POA: Diagnosis not present

## 2019-06-16 DIAGNOSIS — Z9181 History of falling: Secondary | ICD-10-CM | POA: Diagnosis not present

## 2019-06-16 DIAGNOSIS — D649 Anemia, unspecified: Secondary | ICD-10-CM | POA: Diagnosis not present

## 2019-06-16 DIAGNOSIS — I10 Essential (primary) hypertension: Secondary | ICD-10-CM | POA: Diagnosis not present

## 2019-06-16 DIAGNOSIS — E559 Vitamin D deficiency, unspecified: Secondary | ICD-10-CM | POA: Diagnosis not present

## 2019-06-19 DIAGNOSIS — L97412 Non-pressure chronic ulcer of right heel and midfoot with fat layer exposed: Secondary | ICD-10-CM | POA: Diagnosis not present

## 2019-06-19 DIAGNOSIS — T8189XA Other complications of procedures, not elsewhere classified, initial encounter: Secondary | ICD-10-CM | POA: Diagnosis not present

## 2019-06-19 DIAGNOSIS — S91102A Unspecified open wound of left great toe without damage to nail, initial encounter: Secondary | ICD-10-CM | POA: Diagnosis not present

## 2019-06-19 DIAGNOSIS — S91301A Unspecified open wound, right foot, initial encounter: Secondary | ICD-10-CM | POA: Diagnosis not present

## 2019-06-19 DIAGNOSIS — L97522 Non-pressure chronic ulcer of other part of left foot with fat layer exposed: Secondary | ICD-10-CM | POA: Diagnosis not present

## 2019-06-19 DIAGNOSIS — E11621 Type 2 diabetes mellitus with foot ulcer: Secondary | ICD-10-CM | POA: Diagnosis not present

## 2019-06-21 DIAGNOSIS — Z794 Long term (current) use of insulin: Secondary | ICD-10-CM | POA: Diagnosis not present

## 2019-06-21 DIAGNOSIS — E11621 Type 2 diabetes mellitus with foot ulcer: Secondary | ICD-10-CM | POA: Diagnosis not present

## 2019-06-21 DIAGNOSIS — D649 Anemia, unspecified: Secondary | ICD-10-CM | POA: Diagnosis not present

## 2019-06-21 DIAGNOSIS — Z9981 Dependence on supplemental oxygen: Secondary | ICD-10-CM | POA: Diagnosis not present

## 2019-06-21 DIAGNOSIS — Z79891 Long term (current) use of opiate analgesic: Secondary | ICD-10-CM | POA: Diagnosis not present

## 2019-06-21 DIAGNOSIS — E7800 Pure hypercholesterolemia, unspecified: Secondary | ICD-10-CM | POA: Diagnosis not present

## 2019-06-21 DIAGNOSIS — M47816 Spondylosis without myelopathy or radiculopathy, lumbar region: Secondary | ICD-10-CM | POA: Diagnosis not present

## 2019-06-21 DIAGNOSIS — L97522 Non-pressure chronic ulcer of other part of left foot with fat layer exposed: Secondary | ICD-10-CM | POA: Diagnosis not present

## 2019-06-21 DIAGNOSIS — E559 Vitamin D deficiency, unspecified: Secondary | ICD-10-CM | POA: Diagnosis not present

## 2019-06-21 DIAGNOSIS — K219 Gastro-esophageal reflux disease without esophagitis: Secondary | ICD-10-CM | POA: Diagnosis not present

## 2019-06-21 DIAGNOSIS — G894 Chronic pain syndrome: Secondary | ICD-10-CM | POA: Diagnosis not present

## 2019-06-21 DIAGNOSIS — E612 Magnesium deficiency: Secondary | ICD-10-CM | POA: Diagnosis not present

## 2019-06-21 DIAGNOSIS — E1165 Type 2 diabetes mellitus with hyperglycemia: Secondary | ICD-10-CM | POA: Diagnosis not present

## 2019-06-21 DIAGNOSIS — E1142 Type 2 diabetes mellitus with diabetic polyneuropathy: Secondary | ICD-10-CM | POA: Diagnosis not present

## 2019-06-21 DIAGNOSIS — L97412 Non-pressure chronic ulcer of right heel and midfoot with fat layer exposed: Secondary | ICD-10-CM | POA: Diagnosis not present

## 2019-06-21 DIAGNOSIS — I10 Essential (primary) hypertension: Secondary | ICD-10-CM | POA: Diagnosis not present

## 2019-06-21 DIAGNOSIS — Z9181 History of falling: Secondary | ICD-10-CM | POA: Diagnosis not present

## 2019-06-21 DIAGNOSIS — J449 Chronic obstructive pulmonary disease, unspecified: Secondary | ICD-10-CM | POA: Diagnosis not present

## 2019-06-23 DIAGNOSIS — L97522 Non-pressure chronic ulcer of other part of left foot with fat layer exposed: Secondary | ICD-10-CM | POA: Diagnosis not present

## 2019-06-23 DIAGNOSIS — E559 Vitamin D deficiency, unspecified: Secondary | ICD-10-CM | POA: Diagnosis not present

## 2019-06-23 DIAGNOSIS — Z794 Long term (current) use of insulin: Secondary | ICD-10-CM | POA: Diagnosis not present

## 2019-06-23 DIAGNOSIS — J449 Chronic obstructive pulmonary disease, unspecified: Secondary | ICD-10-CM | POA: Diagnosis not present

## 2019-06-23 DIAGNOSIS — D649 Anemia, unspecified: Secondary | ICD-10-CM | POA: Diagnosis not present

## 2019-06-23 DIAGNOSIS — I10 Essential (primary) hypertension: Secondary | ICD-10-CM | POA: Diagnosis not present

## 2019-06-23 DIAGNOSIS — K219 Gastro-esophageal reflux disease without esophagitis: Secondary | ICD-10-CM | POA: Diagnosis not present

## 2019-06-23 DIAGNOSIS — E1165 Type 2 diabetes mellitus with hyperglycemia: Secondary | ICD-10-CM | POA: Diagnosis not present

## 2019-06-23 DIAGNOSIS — Z79891 Long term (current) use of opiate analgesic: Secondary | ICD-10-CM | POA: Diagnosis not present

## 2019-06-23 DIAGNOSIS — Z9181 History of falling: Secondary | ICD-10-CM | POA: Diagnosis not present

## 2019-06-23 DIAGNOSIS — E11621 Type 2 diabetes mellitus with foot ulcer: Secondary | ICD-10-CM | POA: Diagnosis not present

## 2019-06-23 DIAGNOSIS — L97412 Non-pressure chronic ulcer of right heel and midfoot with fat layer exposed: Secondary | ICD-10-CM | POA: Diagnosis not present

## 2019-06-23 DIAGNOSIS — E612 Magnesium deficiency: Secondary | ICD-10-CM | POA: Diagnosis not present

## 2019-06-23 DIAGNOSIS — E1142 Type 2 diabetes mellitus with diabetic polyneuropathy: Secondary | ICD-10-CM | POA: Diagnosis not present

## 2019-06-23 DIAGNOSIS — E7800 Pure hypercholesterolemia, unspecified: Secondary | ICD-10-CM | POA: Diagnosis not present

## 2019-06-23 DIAGNOSIS — Z9981 Dependence on supplemental oxygen: Secondary | ICD-10-CM | POA: Diagnosis not present

## 2019-06-25 DIAGNOSIS — L97409 Non-pressure chronic ulcer of unspecified heel and midfoot with unspecified severity: Secondary | ICD-10-CM | POA: Diagnosis not present

## 2019-06-25 DIAGNOSIS — E08621 Diabetes mellitus due to underlying condition with foot ulcer: Secondary | ICD-10-CM | POA: Diagnosis not present

## 2019-06-25 DIAGNOSIS — L97521 Non-pressure chronic ulcer of other part of left foot limited to breakdown of skin: Secondary | ICD-10-CM | POA: Diagnosis not present

## 2019-06-25 DIAGNOSIS — E11621 Type 2 diabetes mellitus with foot ulcer: Secondary | ICD-10-CM | POA: Diagnosis not present

## 2019-06-26 DIAGNOSIS — E11621 Type 2 diabetes mellitus with foot ulcer: Secondary | ICD-10-CM | POA: Diagnosis not present

## 2019-06-26 DIAGNOSIS — L97412 Non-pressure chronic ulcer of right heel and midfoot with fat layer exposed: Secondary | ICD-10-CM | POA: Diagnosis not present

## 2019-06-26 DIAGNOSIS — S91102A Unspecified open wound of left great toe without damage to nail, initial encounter: Secondary | ICD-10-CM | POA: Diagnosis not present

## 2019-06-26 DIAGNOSIS — L97522 Non-pressure chronic ulcer of other part of left foot with fat layer exposed: Secondary | ICD-10-CM | POA: Diagnosis not present

## 2019-06-26 DIAGNOSIS — T8189XA Other complications of procedures, not elsewhere classified, initial encounter: Secondary | ICD-10-CM | POA: Diagnosis not present

## 2019-06-26 DIAGNOSIS — S91301A Unspecified open wound, right foot, initial encounter: Secondary | ICD-10-CM | POA: Diagnosis not present

## 2019-06-28 DIAGNOSIS — Z79891 Long term (current) use of opiate analgesic: Secondary | ICD-10-CM | POA: Diagnosis not present

## 2019-06-28 DIAGNOSIS — K219 Gastro-esophageal reflux disease without esophagitis: Secondary | ICD-10-CM | POA: Diagnosis not present

## 2019-06-28 DIAGNOSIS — L97412 Non-pressure chronic ulcer of right heel and midfoot with fat layer exposed: Secondary | ICD-10-CM | POA: Diagnosis not present

## 2019-06-28 DIAGNOSIS — I10 Essential (primary) hypertension: Secondary | ICD-10-CM | POA: Diagnosis not present

## 2019-06-28 DIAGNOSIS — Z794 Long term (current) use of insulin: Secondary | ICD-10-CM | POA: Diagnosis not present

## 2019-06-28 DIAGNOSIS — J449 Chronic obstructive pulmonary disease, unspecified: Secondary | ICD-10-CM | POA: Diagnosis not present

## 2019-06-28 DIAGNOSIS — E11621 Type 2 diabetes mellitus with foot ulcer: Secondary | ICD-10-CM | POA: Diagnosis not present

## 2019-06-28 DIAGNOSIS — E1165 Type 2 diabetes mellitus with hyperglycemia: Secondary | ICD-10-CM | POA: Diagnosis not present

## 2019-06-28 DIAGNOSIS — E7800 Pure hypercholesterolemia, unspecified: Secondary | ICD-10-CM | POA: Diagnosis not present

## 2019-06-28 DIAGNOSIS — Z9181 History of falling: Secondary | ICD-10-CM | POA: Diagnosis not present

## 2019-06-28 DIAGNOSIS — E612 Magnesium deficiency: Secondary | ICD-10-CM | POA: Diagnosis not present

## 2019-06-28 DIAGNOSIS — Z9981 Dependence on supplemental oxygen: Secondary | ICD-10-CM | POA: Diagnosis not present

## 2019-06-28 DIAGNOSIS — D649 Anemia, unspecified: Secondary | ICD-10-CM | POA: Diagnosis not present

## 2019-06-28 DIAGNOSIS — E559 Vitamin D deficiency, unspecified: Secondary | ICD-10-CM | POA: Diagnosis not present

## 2019-06-28 DIAGNOSIS — E1142 Type 2 diabetes mellitus with diabetic polyneuropathy: Secondary | ICD-10-CM | POA: Diagnosis not present

## 2019-06-28 DIAGNOSIS — L97522 Non-pressure chronic ulcer of other part of left foot with fat layer exposed: Secondary | ICD-10-CM | POA: Diagnosis not present

## 2019-06-30 DIAGNOSIS — E11621 Type 2 diabetes mellitus with foot ulcer: Secondary | ICD-10-CM | POA: Diagnosis not present

## 2019-06-30 DIAGNOSIS — E612 Magnesium deficiency: Secondary | ICD-10-CM | POA: Diagnosis not present

## 2019-06-30 DIAGNOSIS — L97412 Non-pressure chronic ulcer of right heel and midfoot with fat layer exposed: Secondary | ICD-10-CM | POA: Diagnosis not present

## 2019-06-30 DIAGNOSIS — Z794 Long term (current) use of insulin: Secondary | ICD-10-CM | POA: Diagnosis not present

## 2019-06-30 DIAGNOSIS — E7800 Pure hypercholesterolemia, unspecified: Secondary | ICD-10-CM | POA: Diagnosis not present

## 2019-06-30 DIAGNOSIS — D649 Anemia, unspecified: Secondary | ICD-10-CM | POA: Diagnosis not present

## 2019-06-30 DIAGNOSIS — J449 Chronic obstructive pulmonary disease, unspecified: Secondary | ICD-10-CM | POA: Diagnosis not present

## 2019-06-30 DIAGNOSIS — E1142 Type 2 diabetes mellitus with diabetic polyneuropathy: Secondary | ICD-10-CM | POA: Diagnosis not present

## 2019-06-30 DIAGNOSIS — E1165 Type 2 diabetes mellitus with hyperglycemia: Secondary | ICD-10-CM | POA: Diagnosis not present

## 2019-06-30 DIAGNOSIS — Z9981 Dependence on supplemental oxygen: Secondary | ICD-10-CM | POA: Diagnosis not present

## 2019-06-30 DIAGNOSIS — K219 Gastro-esophageal reflux disease without esophagitis: Secondary | ICD-10-CM | POA: Diagnosis not present

## 2019-06-30 DIAGNOSIS — E559 Vitamin D deficiency, unspecified: Secondary | ICD-10-CM | POA: Diagnosis not present

## 2019-06-30 DIAGNOSIS — I10 Essential (primary) hypertension: Secondary | ICD-10-CM | POA: Diagnosis not present

## 2019-06-30 DIAGNOSIS — L97522 Non-pressure chronic ulcer of other part of left foot with fat layer exposed: Secondary | ICD-10-CM | POA: Diagnosis not present

## 2019-06-30 DIAGNOSIS — Z79891 Long term (current) use of opiate analgesic: Secondary | ICD-10-CM | POA: Diagnosis not present

## 2019-06-30 DIAGNOSIS — Z9181 History of falling: Secondary | ICD-10-CM | POA: Diagnosis not present

## 2019-07-03 DIAGNOSIS — S91102A Unspecified open wound of left great toe without damage to nail, initial encounter: Secondary | ICD-10-CM | POA: Diagnosis not present

## 2019-07-03 DIAGNOSIS — E11621 Type 2 diabetes mellitus with foot ulcer: Secondary | ICD-10-CM | POA: Diagnosis not present

## 2019-07-03 DIAGNOSIS — S91301A Unspecified open wound, right foot, initial encounter: Secondary | ICD-10-CM | POA: Diagnosis not present

## 2019-07-03 DIAGNOSIS — L97412 Non-pressure chronic ulcer of right heel and midfoot with fat layer exposed: Secondary | ICD-10-CM | POA: Diagnosis not present

## 2019-07-03 DIAGNOSIS — L97522 Non-pressure chronic ulcer of other part of left foot with fat layer exposed: Secondary | ICD-10-CM | POA: Diagnosis not present

## 2019-07-03 DIAGNOSIS — T8189XA Other complications of procedures, not elsewhere classified, initial encounter: Secondary | ICD-10-CM | POA: Diagnosis not present

## 2019-07-05 DIAGNOSIS — Z9981 Dependence on supplemental oxygen: Secondary | ICD-10-CM | POA: Diagnosis not present

## 2019-07-05 DIAGNOSIS — E1165 Type 2 diabetes mellitus with hyperglycemia: Secondary | ICD-10-CM | POA: Diagnosis not present

## 2019-07-05 DIAGNOSIS — Z9181 History of falling: Secondary | ICD-10-CM | POA: Diagnosis not present

## 2019-07-05 DIAGNOSIS — E11621 Type 2 diabetes mellitus with foot ulcer: Secondary | ICD-10-CM | POA: Diagnosis not present

## 2019-07-05 DIAGNOSIS — Z794 Long term (current) use of insulin: Secondary | ICD-10-CM | POA: Diagnosis not present

## 2019-07-05 DIAGNOSIS — E7800 Pure hypercholesterolemia, unspecified: Secondary | ICD-10-CM | POA: Diagnosis not present

## 2019-07-05 DIAGNOSIS — L97412 Non-pressure chronic ulcer of right heel and midfoot with fat layer exposed: Secondary | ICD-10-CM | POA: Diagnosis not present

## 2019-07-05 DIAGNOSIS — K219 Gastro-esophageal reflux disease without esophagitis: Secondary | ICD-10-CM | POA: Diagnosis not present

## 2019-07-05 DIAGNOSIS — J449 Chronic obstructive pulmonary disease, unspecified: Secondary | ICD-10-CM | POA: Diagnosis not present

## 2019-07-05 DIAGNOSIS — D649 Anemia, unspecified: Secondary | ICD-10-CM | POA: Diagnosis not present

## 2019-07-05 DIAGNOSIS — I10 Essential (primary) hypertension: Secondary | ICD-10-CM | POA: Diagnosis not present

## 2019-07-05 DIAGNOSIS — E612 Magnesium deficiency: Secondary | ICD-10-CM | POA: Diagnosis not present

## 2019-07-05 DIAGNOSIS — E1142 Type 2 diabetes mellitus with diabetic polyneuropathy: Secondary | ICD-10-CM | POA: Diagnosis not present

## 2019-07-05 DIAGNOSIS — Z79891 Long term (current) use of opiate analgesic: Secondary | ICD-10-CM | POA: Diagnosis not present

## 2019-07-05 DIAGNOSIS — E559 Vitamin D deficiency, unspecified: Secondary | ICD-10-CM | POA: Diagnosis not present

## 2019-07-05 DIAGNOSIS — L97522 Non-pressure chronic ulcer of other part of left foot with fat layer exposed: Secondary | ICD-10-CM | POA: Diagnosis not present

## 2019-07-07 DIAGNOSIS — E1142 Type 2 diabetes mellitus with diabetic polyneuropathy: Secondary | ICD-10-CM | POA: Diagnosis not present

## 2019-07-07 DIAGNOSIS — E559 Vitamin D deficiency, unspecified: Secondary | ICD-10-CM | POA: Diagnosis not present

## 2019-07-07 DIAGNOSIS — E1165 Type 2 diabetes mellitus with hyperglycemia: Secondary | ICD-10-CM | POA: Diagnosis not present

## 2019-07-07 DIAGNOSIS — E7800 Pure hypercholesterolemia, unspecified: Secondary | ICD-10-CM | POA: Diagnosis not present

## 2019-07-07 DIAGNOSIS — L97412 Non-pressure chronic ulcer of right heel and midfoot with fat layer exposed: Secondary | ICD-10-CM | POA: Diagnosis not present

## 2019-07-07 DIAGNOSIS — Z9181 History of falling: Secondary | ICD-10-CM | POA: Diagnosis not present

## 2019-07-07 DIAGNOSIS — L97522 Non-pressure chronic ulcer of other part of left foot with fat layer exposed: Secondary | ICD-10-CM | POA: Diagnosis not present

## 2019-07-07 DIAGNOSIS — I10 Essential (primary) hypertension: Secondary | ICD-10-CM | POA: Diagnosis not present

## 2019-07-07 DIAGNOSIS — Z794 Long term (current) use of insulin: Secondary | ICD-10-CM | POA: Diagnosis not present

## 2019-07-07 DIAGNOSIS — Z79891 Long term (current) use of opiate analgesic: Secondary | ICD-10-CM | POA: Diagnosis not present

## 2019-07-07 DIAGNOSIS — Z9981 Dependence on supplemental oxygen: Secondary | ICD-10-CM | POA: Diagnosis not present

## 2019-07-07 DIAGNOSIS — E11621 Type 2 diabetes mellitus with foot ulcer: Secondary | ICD-10-CM | POA: Diagnosis not present

## 2019-07-07 DIAGNOSIS — K219 Gastro-esophageal reflux disease without esophagitis: Secondary | ICD-10-CM | POA: Diagnosis not present

## 2019-07-07 DIAGNOSIS — E612 Magnesium deficiency: Secondary | ICD-10-CM | POA: Diagnosis not present

## 2019-07-07 DIAGNOSIS — D649 Anemia, unspecified: Secondary | ICD-10-CM | POA: Diagnosis not present

## 2019-07-07 DIAGNOSIS — J449 Chronic obstructive pulmonary disease, unspecified: Secondary | ICD-10-CM | POA: Diagnosis not present

## 2019-07-10 DIAGNOSIS — L97412 Non-pressure chronic ulcer of right heel and midfoot with fat layer exposed: Secondary | ICD-10-CM | POA: Diagnosis not present

## 2019-07-10 DIAGNOSIS — T8189XA Other complications of procedures, not elsewhere classified, initial encounter: Secondary | ICD-10-CM | POA: Diagnosis not present

## 2019-07-10 DIAGNOSIS — E11622 Type 2 diabetes mellitus with other skin ulcer: Secondary | ICD-10-CM | POA: Diagnosis not present

## 2019-07-10 DIAGNOSIS — L97522 Non-pressure chronic ulcer of other part of left foot with fat layer exposed: Secondary | ICD-10-CM | POA: Diagnosis not present

## 2019-07-10 DIAGNOSIS — E11621 Type 2 diabetes mellitus with foot ulcer: Secondary | ICD-10-CM | POA: Diagnosis not present

## 2019-07-10 DIAGNOSIS — S91301A Unspecified open wound, right foot, initial encounter: Secondary | ICD-10-CM | POA: Diagnosis not present

## 2019-07-10 DIAGNOSIS — S91102A Unspecified open wound of left great toe without damage to nail, initial encounter: Secondary | ICD-10-CM | POA: Diagnosis not present

## 2019-07-12 DIAGNOSIS — E1142 Type 2 diabetes mellitus with diabetic polyneuropathy: Secondary | ICD-10-CM | POA: Diagnosis not present

## 2019-07-12 DIAGNOSIS — L97522 Non-pressure chronic ulcer of other part of left foot with fat layer exposed: Secondary | ICD-10-CM | POA: Diagnosis not present

## 2019-07-12 DIAGNOSIS — D649 Anemia, unspecified: Secondary | ICD-10-CM | POA: Diagnosis not present

## 2019-07-12 DIAGNOSIS — E612 Magnesium deficiency: Secondary | ICD-10-CM | POA: Diagnosis not present

## 2019-07-12 DIAGNOSIS — K219 Gastro-esophageal reflux disease without esophagitis: Secondary | ICD-10-CM | POA: Diagnosis not present

## 2019-07-12 DIAGNOSIS — Z79891 Long term (current) use of opiate analgesic: Secondary | ICD-10-CM | POA: Diagnosis not present

## 2019-07-12 DIAGNOSIS — I10 Essential (primary) hypertension: Secondary | ICD-10-CM | POA: Diagnosis not present

## 2019-07-12 DIAGNOSIS — J449 Chronic obstructive pulmonary disease, unspecified: Secondary | ICD-10-CM | POA: Diagnosis not present

## 2019-07-12 DIAGNOSIS — E11621 Type 2 diabetes mellitus with foot ulcer: Secondary | ICD-10-CM | POA: Diagnosis not present

## 2019-07-12 DIAGNOSIS — Z794 Long term (current) use of insulin: Secondary | ICD-10-CM | POA: Diagnosis not present

## 2019-07-12 DIAGNOSIS — E1165 Type 2 diabetes mellitus with hyperglycemia: Secondary | ICD-10-CM | POA: Diagnosis not present

## 2019-07-12 DIAGNOSIS — Z9981 Dependence on supplemental oxygen: Secondary | ICD-10-CM | POA: Diagnosis not present

## 2019-07-12 DIAGNOSIS — E559 Vitamin D deficiency, unspecified: Secondary | ICD-10-CM | POA: Diagnosis not present

## 2019-07-12 DIAGNOSIS — E7800 Pure hypercholesterolemia, unspecified: Secondary | ICD-10-CM | POA: Diagnosis not present

## 2019-07-12 DIAGNOSIS — Z9181 History of falling: Secondary | ICD-10-CM | POA: Diagnosis not present

## 2019-07-12 DIAGNOSIS — L97412 Non-pressure chronic ulcer of right heel and midfoot with fat layer exposed: Secondary | ICD-10-CM | POA: Diagnosis not present

## 2019-07-14 DIAGNOSIS — K219 Gastro-esophageal reflux disease without esophagitis: Secondary | ICD-10-CM | POA: Diagnosis not present

## 2019-07-14 DIAGNOSIS — L97412 Non-pressure chronic ulcer of right heel and midfoot with fat layer exposed: Secondary | ICD-10-CM | POA: Diagnosis not present

## 2019-07-14 DIAGNOSIS — E559 Vitamin D deficiency, unspecified: Secondary | ICD-10-CM | POA: Diagnosis not present

## 2019-07-14 DIAGNOSIS — Z9181 History of falling: Secondary | ICD-10-CM | POA: Diagnosis not present

## 2019-07-14 DIAGNOSIS — J449 Chronic obstructive pulmonary disease, unspecified: Secondary | ICD-10-CM | POA: Diagnosis not present

## 2019-07-14 DIAGNOSIS — E11621 Type 2 diabetes mellitus with foot ulcer: Secondary | ICD-10-CM | POA: Diagnosis not present

## 2019-07-14 DIAGNOSIS — E7800 Pure hypercholesterolemia, unspecified: Secondary | ICD-10-CM | POA: Diagnosis not present

## 2019-07-14 DIAGNOSIS — Z794 Long term (current) use of insulin: Secondary | ICD-10-CM | POA: Diagnosis not present

## 2019-07-14 DIAGNOSIS — E1142 Type 2 diabetes mellitus with diabetic polyneuropathy: Secondary | ICD-10-CM | POA: Diagnosis not present

## 2019-07-14 DIAGNOSIS — E1165 Type 2 diabetes mellitus with hyperglycemia: Secondary | ICD-10-CM | POA: Diagnosis not present

## 2019-07-14 DIAGNOSIS — I10 Essential (primary) hypertension: Secondary | ICD-10-CM | POA: Diagnosis not present

## 2019-07-14 DIAGNOSIS — D649 Anemia, unspecified: Secondary | ICD-10-CM | POA: Diagnosis not present

## 2019-07-14 DIAGNOSIS — Z9981 Dependence on supplemental oxygen: Secondary | ICD-10-CM | POA: Diagnosis not present

## 2019-07-14 DIAGNOSIS — L97522 Non-pressure chronic ulcer of other part of left foot with fat layer exposed: Secondary | ICD-10-CM | POA: Diagnosis not present

## 2019-07-14 DIAGNOSIS — Z79891 Long term (current) use of opiate analgesic: Secondary | ICD-10-CM | POA: Diagnosis not present

## 2019-07-14 DIAGNOSIS — E612 Magnesium deficiency: Secondary | ICD-10-CM | POA: Diagnosis not present

## 2019-07-17 DIAGNOSIS — L97522 Non-pressure chronic ulcer of other part of left foot with fat layer exposed: Secondary | ICD-10-CM | POA: Diagnosis not present

## 2019-07-17 DIAGNOSIS — I10 Essential (primary) hypertension: Secondary | ICD-10-CM | POA: Diagnosis not present

## 2019-07-17 DIAGNOSIS — E7800 Pure hypercholesterolemia, unspecified: Secondary | ICD-10-CM | POA: Diagnosis not present

## 2019-07-17 DIAGNOSIS — Z79891 Long term (current) use of opiate analgesic: Secondary | ICD-10-CM | POA: Diagnosis not present

## 2019-07-17 DIAGNOSIS — Z9981 Dependence on supplemental oxygen: Secondary | ICD-10-CM | POA: Diagnosis not present

## 2019-07-17 DIAGNOSIS — J449 Chronic obstructive pulmonary disease, unspecified: Secondary | ICD-10-CM | POA: Diagnosis not present

## 2019-07-17 DIAGNOSIS — E11621 Type 2 diabetes mellitus with foot ulcer: Secondary | ICD-10-CM | POA: Diagnosis not present

## 2019-07-17 DIAGNOSIS — L97412 Non-pressure chronic ulcer of right heel and midfoot with fat layer exposed: Secondary | ICD-10-CM | POA: Diagnosis not present

## 2019-07-17 DIAGNOSIS — D649 Anemia, unspecified: Secondary | ICD-10-CM | POA: Diagnosis not present

## 2019-07-17 DIAGNOSIS — E559 Vitamin D deficiency, unspecified: Secondary | ICD-10-CM | POA: Diagnosis not present

## 2019-07-17 DIAGNOSIS — K219 Gastro-esophageal reflux disease without esophagitis: Secondary | ICD-10-CM | POA: Diagnosis not present

## 2019-07-17 DIAGNOSIS — E1142 Type 2 diabetes mellitus with diabetic polyneuropathy: Secondary | ICD-10-CM | POA: Diagnosis not present

## 2019-07-17 DIAGNOSIS — E612 Magnesium deficiency: Secondary | ICD-10-CM | POA: Diagnosis not present

## 2019-07-17 DIAGNOSIS — Z794 Long term (current) use of insulin: Secondary | ICD-10-CM | POA: Diagnosis not present

## 2019-07-17 DIAGNOSIS — E1165 Type 2 diabetes mellitus with hyperglycemia: Secondary | ICD-10-CM | POA: Diagnosis not present

## 2019-07-17 DIAGNOSIS — Z9181 History of falling: Secondary | ICD-10-CM | POA: Diagnosis not present

## 2019-07-18 DIAGNOSIS — G894 Chronic pain syndrome: Secondary | ICD-10-CM | POA: Diagnosis not present

## 2019-07-18 DIAGNOSIS — M47816 Spondylosis without myelopathy or radiculopathy, lumbar region: Secondary | ICD-10-CM | POA: Diagnosis not present

## 2019-07-19 DIAGNOSIS — Z79891 Long term (current) use of opiate analgesic: Secondary | ICD-10-CM | POA: Diagnosis not present

## 2019-07-19 DIAGNOSIS — E7800 Pure hypercholesterolemia, unspecified: Secondary | ICD-10-CM | POA: Diagnosis not present

## 2019-07-19 DIAGNOSIS — E1165 Type 2 diabetes mellitus with hyperglycemia: Secondary | ICD-10-CM | POA: Diagnosis not present

## 2019-07-19 DIAGNOSIS — Z794 Long term (current) use of insulin: Secondary | ICD-10-CM | POA: Diagnosis not present

## 2019-07-19 DIAGNOSIS — E559 Vitamin D deficiency, unspecified: Secondary | ICD-10-CM | POA: Diagnosis not present

## 2019-07-19 DIAGNOSIS — Z9981 Dependence on supplemental oxygen: Secondary | ICD-10-CM | POA: Diagnosis not present

## 2019-07-19 DIAGNOSIS — J449 Chronic obstructive pulmonary disease, unspecified: Secondary | ICD-10-CM | POA: Diagnosis not present

## 2019-07-19 DIAGNOSIS — L97412 Non-pressure chronic ulcer of right heel and midfoot with fat layer exposed: Secondary | ICD-10-CM | POA: Diagnosis not present

## 2019-07-19 DIAGNOSIS — E11621 Type 2 diabetes mellitus with foot ulcer: Secondary | ICD-10-CM | POA: Diagnosis not present

## 2019-07-19 DIAGNOSIS — E612 Magnesium deficiency: Secondary | ICD-10-CM | POA: Diagnosis not present

## 2019-07-19 DIAGNOSIS — D649 Anemia, unspecified: Secondary | ICD-10-CM | POA: Diagnosis not present

## 2019-07-19 DIAGNOSIS — L97522 Non-pressure chronic ulcer of other part of left foot with fat layer exposed: Secondary | ICD-10-CM | POA: Diagnosis not present

## 2019-07-19 DIAGNOSIS — Z9181 History of falling: Secondary | ICD-10-CM | POA: Diagnosis not present

## 2019-07-19 DIAGNOSIS — E1142 Type 2 diabetes mellitus with diabetic polyneuropathy: Secondary | ICD-10-CM | POA: Diagnosis not present

## 2019-07-19 DIAGNOSIS — K219 Gastro-esophageal reflux disease without esophagitis: Secondary | ICD-10-CM | POA: Diagnosis not present

## 2019-07-19 DIAGNOSIS — I10 Essential (primary) hypertension: Secondary | ICD-10-CM | POA: Diagnosis not present

## 2019-07-21 DIAGNOSIS — E11621 Type 2 diabetes mellitus with foot ulcer: Secondary | ICD-10-CM | POA: Diagnosis not present

## 2019-07-21 DIAGNOSIS — D649 Anemia, unspecified: Secondary | ICD-10-CM | POA: Diagnosis not present

## 2019-07-21 DIAGNOSIS — Z794 Long term (current) use of insulin: Secondary | ICD-10-CM | POA: Diagnosis not present

## 2019-07-21 DIAGNOSIS — E612 Magnesium deficiency: Secondary | ICD-10-CM | POA: Diagnosis not present

## 2019-07-21 DIAGNOSIS — E559 Vitamin D deficiency, unspecified: Secondary | ICD-10-CM | POA: Diagnosis not present

## 2019-07-21 DIAGNOSIS — L97522 Non-pressure chronic ulcer of other part of left foot with fat layer exposed: Secondary | ICD-10-CM | POA: Diagnosis not present

## 2019-07-21 DIAGNOSIS — Z79891 Long term (current) use of opiate analgesic: Secondary | ICD-10-CM | POA: Diagnosis not present

## 2019-07-21 DIAGNOSIS — E1142 Type 2 diabetes mellitus with diabetic polyneuropathy: Secondary | ICD-10-CM | POA: Diagnosis not present

## 2019-07-21 DIAGNOSIS — I10 Essential (primary) hypertension: Secondary | ICD-10-CM | POA: Diagnosis not present

## 2019-07-21 DIAGNOSIS — Z9981 Dependence on supplemental oxygen: Secondary | ICD-10-CM | POA: Diagnosis not present

## 2019-07-21 DIAGNOSIS — Z9181 History of falling: Secondary | ICD-10-CM | POA: Diagnosis not present

## 2019-07-21 DIAGNOSIS — E1165 Type 2 diabetes mellitus with hyperglycemia: Secondary | ICD-10-CM | POA: Diagnosis not present

## 2019-07-21 DIAGNOSIS — E7800 Pure hypercholesterolemia, unspecified: Secondary | ICD-10-CM | POA: Diagnosis not present

## 2019-07-21 DIAGNOSIS — L97412 Non-pressure chronic ulcer of right heel and midfoot with fat layer exposed: Secondary | ICD-10-CM | POA: Diagnosis not present

## 2019-07-21 DIAGNOSIS — K219 Gastro-esophageal reflux disease without esophagitis: Secondary | ICD-10-CM | POA: Diagnosis not present

## 2019-07-21 DIAGNOSIS — J449 Chronic obstructive pulmonary disease, unspecified: Secondary | ICD-10-CM | POA: Diagnosis not present

## 2019-07-23 DIAGNOSIS — J449 Chronic obstructive pulmonary disease, unspecified: Secondary | ICD-10-CM | POA: Diagnosis not present

## 2019-07-24 DIAGNOSIS — L97312 Non-pressure chronic ulcer of right ankle with fat layer exposed: Secondary | ICD-10-CM | POA: Diagnosis not present

## 2019-07-24 DIAGNOSIS — L97522 Non-pressure chronic ulcer of other part of left foot with fat layer exposed: Secondary | ICD-10-CM | POA: Diagnosis not present

## 2019-07-24 DIAGNOSIS — E11621 Type 2 diabetes mellitus with foot ulcer: Secondary | ICD-10-CM | POA: Diagnosis not present

## 2019-07-24 DIAGNOSIS — L97412 Non-pressure chronic ulcer of right heel and midfoot with fat layer exposed: Secondary | ICD-10-CM | POA: Diagnosis not present

## 2019-07-24 DIAGNOSIS — T8189XA Other complications of procedures, not elsewhere classified, initial encounter: Secondary | ICD-10-CM | POA: Diagnosis not present

## 2019-07-24 DIAGNOSIS — I10 Essential (primary) hypertension: Secondary | ICD-10-CM | POA: Diagnosis not present

## 2019-07-25 DIAGNOSIS — L97409 Non-pressure chronic ulcer of unspecified heel and midfoot with unspecified severity: Secondary | ICD-10-CM | POA: Diagnosis not present

## 2019-07-25 DIAGNOSIS — E11621 Type 2 diabetes mellitus with foot ulcer: Secondary | ICD-10-CM | POA: Diagnosis not present

## 2019-07-25 DIAGNOSIS — L97521 Non-pressure chronic ulcer of other part of left foot limited to breakdown of skin: Secondary | ICD-10-CM | POA: Diagnosis not present

## 2019-07-25 DIAGNOSIS — E08621 Diabetes mellitus due to underlying condition with foot ulcer: Secondary | ICD-10-CM | POA: Diagnosis not present

## 2019-07-26 DIAGNOSIS — L97522 Non-pressure chronic ulcer of other part of left foot with fat layer exposed: Secondary | ICD-10-CM | POA: Diagnosis not present

## 2019-07-26 DIAGNOSIS — K219 Gastro-esophageal reflux disease without esophagitis: Secondary | ICD-10-CM | POA: Diagnosis not present

## 2019-07-26 DIAGNOSIS — I10 Essential (primary) hypertension: Secondary | ICD-10-CM | POA: Diagnosis not present

## 2019-07-26 DIAGNOSIS — E1165 Type 2 diabetes mellitus with hyperglycemia: Secondary | ICD-10-CM | POA: Diagnosis not present

## 2019-07-26 DIAGNOSIS — Z9981 Dependence on supplemental oxygen: Secondary | ICD-10-CM | POA: Diagnosis not present

## 2019-07-26 DIAGNOSIS — E7800 Pure hypercholesterolemia, unspecified: Secondary | ICD-10-CM | POA: Diagnosis not present

## 2019-07-26 DIAGNOSIS — E11621 Type 2 diabetes mellitus with foot ulcer: Secondary | ICD-10-CM | POA: Diagnosis not present

## 2019-07-26 DIAGNOSIS — E612 Magnesium deficiency: Secondary | ICD-10-CM | POA: Diagnosis not present

## 2019-07-26 DIAGNOSIS — Z794 Long term (current) use of insulin: Secondary | ICD-10-CM | POA: Diagnosis not present

## 2019-07-26 DIAGNOSIS — D649 Anemia, unspecified: Secondary | ICD-10-CM | POA: Diagnosis not present

## 2019-07-26 DIAGNOSIS — E1142 Type 2 diabetes mellitus with diabetic polyneuropathy: Secondary | ICD-10-CM | POA: Diagnosis not present

## 2019-07-26 DIAGNOSIS — Z9181 History of falling: Secondary | ICD-10-CM | POA: Diagnosis not present

## 2019-07-26 DIAGNOSIS — J449 Chronic obstructive pulmonary disease, unspecified: Secondary | ICD-10-CM | POA: Diagnosis not present

## 2019-07-26 DIAGNOSIS — L97412 Non-pressure chronic ulcer of right heel and midfoot with fat layer exposed: Secondary | ICD-10-CM | POA: Diagnosis not present

## 2019-07-26 DIAGNOSIS — Z79891 Long term (current) use of opiate analgesic: Secondary | ICD-10-CM | POA: Diagnosis not present

## 2019-07-26 DIAGNOSIS — E559 Vitamin D deficiency, unspecified: Secondary | ICD-10-CM | POA: Diagnosis not present

## 2019-07-28 DIAGNOSIS — E7800 Pure hypercholesterolemia, unspecified: Secondary | ICD-10-CM | POA: Diagnosis not present

## 2019-07-28 DIAGNOSIS — J449 Chronic obstructive pulmonary disease, unspecified: Secondary | ICD-10-CM | POA: Diagnosis not present

## 2019-07-28 DIAGNOSIS — Z79891 Long term (current) use of opiate analgesic: Secondary | ICD-10-CM | POA: Diagnosis not present

## 2019-07-28 DIAGNOSIS — E559 Vitamin D deficiency, unspecified: Secondary | ICD-10-CM | POA: Diagnosis not present

## 2019-07-28 DIAGNOSIS — K219 Gastro-esophageal reflux disease without esophagitis: Secondary | ICD-10-CM | POA: Diagnosis not present

## 2019-07-28 DIAGNOSIS — L97522 Non-pressure chronic ulcer of other part of left foot with fat layer exposed: Secondary | ICD-10-CM | POA: Diagnosis not present

## 2019-07-28 DIAGNOSIS — E1142 Type 2 diabetes mellitus with diabetic polyneuropathy: Secondary | ICD-10-CM | POA: Diagnosis not present

## 2019-07-28 DIAGNOSIS — I10 Essential (primary) hypertension: Secondary | ICD-10-CM | POA: Diagnosis not present

## 2019-07-28 DIAGNOSIS — E1165 Type 2 diabetes mellitus with hyperglycemia: Secondary | ICD-10-CM | POA: Diagnosis not present

## 2019-07-28 DIAGNOSIS — Z9181 History of falling: Secondary | ICD-10-CM | POA: Diagnosis not present

## 2019-07-28 DIAGNOSIS — Z794 Long term (current) use of insulin: Secondary | ICD-10-CM | POA: Diagnosis not present

## 2019-07-28 DIAGNOSIS — E11621 Type 2 diabetes mellitus with foot ulcer: Secondary | ICD-10-CM | POA: Diagnosis not present

## 2019-07-28 DIAGNOSIS — E612 Magnesium deficiency: Secondary | ICD-10-CM | POA: Diagnosis not present

## 2019-07-28 DIAGNOSIS — Z9981 Dependence on supplemental oxygen: Secondary | ICD-10-CM | POA: Diagnosis not present

## 2019-07-28 DIAGNOSIS — L97412 Non-pressure chronic ulcer of right heel and midfoot with fat layer exposed: Secondary | ICD-10-CM | POA: Diagnosis not present

## 2019-07-28 DIAGNOSIS — D649 Anemia, unspecified: Secondary | ICD-10-CM | POA: Diagnosis not present

## 2019-07-31 DIAGNOSIS — L97412 Non-pressure chronic ulcer of right heel and midfoot with fat layer exposed: Secondary | ICD-10-CM | POA: Diagnosis not present

## 2019-07-31 DIAGNOSIS — T8189XA Other complications of procedures, not elsewhere classified, initial encounter: Secondary | ICD-10-CM | POA: Diagnosis not present

## 2019-07-31 DIAGNOSIS — I10 Essential (primary) hypertension: Secondary | ICD-10-CM | POA: Diagnosis not present

## 2019-07-31 DIAGNOSIS — L97522 Non-pressure chronic ulcer of other part of left foot with fat layer exposed: Secondary | ICD-10-CM | POA: Diagnosis not present

## 2019-07-31 DIAGNOSIS — E11621 Type 2 diabetes mellitus with foot ulcer: Secondary | ICD-10-CM | POA: Diagnosis not present

## 2019-08-01 DIAGNOSIS — I10 Essential (primary) hypertension: Secondary | ICD-10-CM | POA: Diagnosis not present

## 2019-08-01 DIAGNOSIS — Z79899 Other long term (current) drug therapy: Secondary | ICD-10-CM | POA: Diagnosis not present

## 2019-08-02 DIAGNOSIS — I10 Essential (primary) hypertension: Secondary | ICD-10-CM | POA: Diagnosis not present

## 2019-08-02 DIAGNOSIS — E7800 Pure hypercholesterolemia, unspecified: Secondary | ICD-10-CM | POA: Diagnosis not present

## 2019-08-02 DIAGNOSIS — E612 Magnesium deficiency: Secondary | ICD-10-CM | POA: Diagnosis not present

## 2019-08-02 DIAGNOSIS — Z794 Long term (current) use of insulin: Secondary | ICD-10-CM | POA: Diagnosis not present

## 2019-08-02 DIAGNOSIS — Z79891 Long term (current) use of opiate analgesic: Secondary | ICD-10-CM | POA: Diagnosis not present

## 2019-08-02 DIAGNOSIS — K219 Gastro-esophageal reflux disease without esophagitis: Secondary | ICD-10-CM | POA: Diagnosis not present

## 2019-08-02 DIAGNOSIS — E559 Vitamin D deficiency, unspecified: Secondary | ICD-10-CM | POA: Diagnosis not present

## 2019-08-02 DIAGNOSIS — J449 Chronic obstructive pulmonary disease, unspecified: Secondary | ICD-10-CM | POA: Diagnosis not present

## 2019-08-02 DIAGNOSIS — Z9981 Dependence on supplemental oxygen: Secondary | ICD-10-CM | POA: Diagnosis not present

## 2019-08-02 DIAGNOSIS — D649 Anemia, unspecified: Secondary | ICD-10-CM | POA: Diagnosis not present

## 2019-08-02 DIAGNOSIS — L97412 Non-pressure chronic ulcer of right heel and midfoot with fat layer exposed: Secondary | ICD-10-CM | POA: Diagnosis not present

## 2019-08-02 DIAGNOSIS — E11621 Type 2 diabetes mellitus with foot ulcer: Secondary | ICD-10-CM | POA: Diagnosis not present

## 2019-08-02 DIAGNOSIS — L97522 Non-pressure chronic ulcer of other part of left foot with fat layer exposed: Secondary | ICD-10-CM | POA: Diagnosis not present

## 2019-08-02 DIAGNOSIS — E1165 Type 2 diabetes mellitus with hyperglycemia: Secondary | ICD-10-CM | POA: Diagnosis not present

## 2019-08-02 DIAGNOSIS — E1142 Type 2 diabetes mellitus with diabetic polyneuropathy: Secondary | ICD-10-CM | POA: Diagnosis not present

## 2019-08-02 DIAGNOSIS — Z9181 History of falling: Secondary | ICD-10-CM | POA: Diagnosis not present

## 2019-08-04 DIAGNOSIS — Z9981 Dependence on supplemental oxygen: Secondary | ICD-10-CM | POA: Diagnosis not present

## 2019-08-04 DIAGNOSIS — E7800 Pure hypercholesterolemia, unspecified: Secondary | ICD-10-CM | POA: Diagnosis not present

## 2019-08-04 DIAGNOSIS — I10 Essential (primary) hypertension: Secondary | ICD-10-CM | POA: Diagnosis not present

## 2019-08-04 DIAGNOSIS — Z9181 History of falling: Secondary | ICD-10-CM | POA: Diagnosis not present

## 2019-08-04 DIAGNOSIS — L97522 Non-pressure chronic ulcer of other part of left foot with fat layer exposed: Secondary | ICD-10-CM | POA: Diagnosis not present

## 2019-08-04 DIAGNOSIS — E1165 Type 2 diabetes mellitus with hyperglycemia: Secondary | ICD-10-CM | POA: Diagnosis not present

## 2019-08-04 DIAGNOSIS — Z79891 Long term (current) use of opiate analgesic: Secondary | ICD-10-CM | POA: Diagnosis not present

## 2019-08-04 DIAGNOSIS — D649 Anemia, unspecified: Secondary | ICD-10-CM | POA: Diagnosis not present

## 2019-08-04 DIAGNOSIS — E1142 Type 2 diabetes mellitus with diabetic polyneuropathy: Secondary | ICD-10-CM | POA: Diagnosis not present

## 2019-08-04 DIAGNOSIS — K219 Gastro-esophageal reflux disease without esophagitis: Secondary | ICD-10-CM | POA: Diagnosis not present

## 2019-08-04 DIAGNOSIS — L97412 Non-pressure chronic ulcer of right heel and midfoot with fat layer exposed: Secondary | ICD-10-CM | POA: Diagnosis not present

## 2019-08-04 DIAGNOSIS — E559 Vitamin D deficiency, unspecified: Secondary | ICD-10-CM | POA: Diagnosis not present

## 2019-08-04 DIAGNOSIS — E11621 Type 2 diabetes mellitus with foot ulcer: Secondary | ICD-10-CM | POA: Diagnosis not present

## 2019-08-04 DIAGNOSIS — Z794 Long term (current) use of insulin: Secondary | ICD-10-CM | POA: Diagnosis not present

## 2019-08-04 DIAGNOSIS — J449 Chronic obstructive pulmonary disease, unspecified: Secondary | ICD-10-CM | POA: Diagnosis not present

## 2019-08-04 DIAGNOSIS — E612 Magnesium deficiency: Secondary | ICD-10-CM | POA: Diagnosis not present

## 2019-08-07 DIAGNOSIS — T8189XA Other complications of procedures, not elsewhere classified, initial encounter: Secondary | ICD-10-CM | POA: Diagnosis not present

## 2019-08-07 DIAGNOSIS — E11621 Type 2 diabetes mellitus with foot ulcer: Secondary | ICD-10-CM | POA: Diagnosis not present

## 2019-08-07 DIAGNOSIS — I10 Essential (primary) hypertension: Secondary | ICD-10-CM | POA: Diagnosis not present

## 2019-08-07 DIAGNOSIS — L97412 Non-pressure chronic ulcer of right heel and midfoot with fat layer exposed: Secondary | ICD-10-CM | POA: Diagnosis not present

## 2019-08-07 DIAGNOSIS — L97522 Non-pressure chronic ulcer of other part of left foot with fat layer exposed: Secondary | ICD-10-CM | POA: Diagnosis not present

## 2019-08-09 DIAGNOSIS — E559 Vitamin D deficiency, unspecified: Secondary | ICD-10-CM | POA: Diagnosis not present

## 2019-08-09 DIAGNOSIS — K219 Gastro-esophageal reflux disease without esophagitis: Secondary | ICD-10-CM | POA: Diagnosis not present

## 2019-08-09 DIAGNOSIS — E1142 Type 2 diabetes mellitus with diabetic polyneuropathy: Secondary | ICD-10-CM | POA: Diagnosis not present

## 2019-08-09 DIAGNOSIS — L97412 Non-pressure chronic ulcer of right heel and midfoot with fat layer exposed: Secondary | ICD-10-CM | POA: Diagnosis not present

## 2019-08-09 DIAGNOSIS — Z794 Long term (current) use of insulin: Secondary | ICD-10-CM | POA: Diagnosis not present

## 2019-08-09 DIAGNOSIS — I10 Essential (primary) hypertension: Secondary | ICD-10-CM | POA: Diagnosis not present

## 2019-08-09 DIAGNOSIS — Z9181 History of falling: Secondary | ICD-10-CM | POA: Diagnosis not present

## 2019-08-09 DIAGNOSIS — Z79891 Long term (current) use of opiate analgesic: Secondary | ICD-10-CM | POA: Diagnosis not present

## 2019-08-09 DIAGNOSIS — J449 Chronic obstructive pulmonary disease, unspecified: Secondary | ICD-10-CM | POA: Diagnosis not present

## 2019-08-09 DIAGNOSIS — E7800 Pure hypercholesterolemia, unspecified: Secondary | ICD-10-CM | POA: Diagnosis not present

## 2019-08-09 DIAGNOSIS — E612 Magnesium deficiency: Secondary | ICD-10-CM | POA: Diagnosis not present

## 2019-08-09 DIAGNOSIS — E1165 Type 2 diabetes mellitus with hyperglycemia: Secondary | ICD-10-CM | POA: Diagnosis not present

## 2019-08-09 DIAGNOSIS — Z9981 Dependence on supplemental oxygen: Secondary | ICD-10-CM | POA: Diagnosis not present

## 2019-08-09 DIAGNOSIS — E11621 Type 2 diabetes mellitus with foot ulcer: Secondary | ICD-10-CM | POA: Diagnosis not present

## 2019-08-09 DIAGNOSIS — L97522 Non-pressure chronic ulcer of other part of left foot with fat layer exposed: Secondary | ICD-10-CM | POA: Diagnosis not present

## 2019-08-09 DIAGNOSIS — D649 Anemia, unspecified: Secondary | ICD-10-CM | POA: Diagnosis not present

## 2019-08-11 DIAGNOSIS — Z79891 Long term (current) use of opiate analgesic: Secondary | ICD-10-CM | POA: Diagnosis not present

## 2019-08-11 DIAGNOSIS — E612 Magnesium deficiency: Secondary | ICD-10-CM | POA: Diagnosis not present

## 2019-08-11 DIAGNOSIS — Z794 Long term (current) use of insulin: Secondary | ICD-10-CM | POA: Diagnosis not present

## 2019-08-11 DIAGNOSIS — E559 Vitamin D deficiency, unspecified: Secondary | ICD-10-CM | POA: Diagnosis not present

## 2019-08-11 DIAGNOSIS — L97412 Non-pressure chronic ulcer of right heel and midfoot with fat layer exposed: Secondary | ICD-10-CM | POA: Diagnosis not present

## 2019-08-11 DIAGNOSIS — Z9181 History of falling: Secondary | ICD-10-CM | POA: Diagnosis not present

## 2019-08-11 DIAGNOSIS — L97522 Non-pressure chronic ulcer of other part of left foot with fat layer exposed: Secondary | ICD-10-CM | POA: Diagnosis not present

## 2019-08-11 DIAGNOSIS — D649 Anemia, unspecified: Secondary | ICD-10-CM | POA: Diagnosis not present

## 2019-08-11 DIAGNOSIS — Z9981 Dependence on supplemental oxygen: Secondary | ICD-10-CM | POA: Diagnosis not present

## 2019-08-11 DIAGNOSIS — E1142 Type 2 diabetes mellitus with diabetic polyneuropathy: Secondary | ICD-10-CM | POA: Diagnosis not present

## 2019-08-11 DIAGNOSIS — I10 Essential (primary) hypertension: Secondary | ICD-10-CM | POA: Diagnosis not present

## 2019-08-11 DIAGNOSIS — E7800 Pure hypercholesterolemia, unspecified: Secondary | ICD-10-CM | POA: Diagnosis not present

## 2019-08-11 DIAGNOSIS — J449 Chronic obstructive pulmonary disease, unspecified: Secondary | ICD-10-CM | POA: Diagnosis not present

## 2019-08-11 DIAGNOSIS — K219 Gastro-esophageal reflux disease without esophagitis: Secondary | ICD-10-CM | POA: Diagnosis not present

## 2019-08-11 DIAGNOSIS — E11621 Type 2 diabetes mellitus with foot ulcer: Secondary | ICD-10-CM | POA: Diagnosis not present

## 2019-08-11 DIAGNOSIS — E1165 Type 2 diabetes mellitus with hyperglycemia: Secondary | ICD-10-CM | POA: Diagnosis not present

## 2019-08-14 DIAGNOSIS — E612 Magnesium deficiency: Secondary | ICD-10-CM | POA: Diagnosis not present

## 2019-08-14 DIAGNOSIS — K219 Gastro-esophageal reflux disease without esophagitis: Secondary | ICD-10-CM | POA: Diagnosis not present

## 2019-08-14 DIAGNOSIS — E1165 Type 2 diabetes mellitus with hyperglycemia: Secondary | ICD-10-CM | POA: Diagnosis not present

## 2019-08-14 DIAGNOSIS — Z79891 Long term (current) use of opiate analgesic: Secondary | ICD-10-CM | POA: Diagnosis not present

## 2019-08-14 DIAGNOSIS — E7800 Pure hypercholesterolemia, unspecified: Secondary | ICD-10-CM | POA: Diagnosis not present

## 2019-08-14 DIAGNOSIS — I10 Essential (primary) hypertension: Secondary | ICD-10-CM | POA: Diagnosis not present

## 2019-08-14 DIAGNOSIS — L97412 Non-pressure chronic ulcer of right heel and midfoot with fat layer exposed: Secondary | ICD-10-CM | POA: Diagnosis not present

## 2019-08-14 DIAGNOSIS — L97522 Non-pressure chronic ulcer of other part of left foot with fat layer exposed: Secondary | ICD-10-CM | POA: Diagnosis not present

## 2019-08-14 DIAGNOSIS — E1142 Type 2 diabetes mellitus with diabetic polyneuropathy: Secondary | ICD-10-CM | POA: Diagnosis not present

## 2019-08-14 DIAGNOSIS — J449 Chronic obstructive pulmonary disease, unspecified: Secondary | ICD-10-CM | POA: Diagnosis not present

## 2019-08-14 DIAGNOSIS — E11621 Type 2 diabetes mellitus with foot ulcer: Secondary | ICD-10-CM | POA: Diagnosis not present

## 2019-08-14 DIAGNOSIS — D649 Anemia, unspecified: Secondary | ICD-10-CM | POA: Diagnosis not present

## 2019-08-14 DIAGNOSIS — Z9981 Dependence on supplemental oxygen: Secondary | ICD-10-CM | POA: Diagnosis not present

## 2019-08-14 DIAGNOSIS — Z9181 History of falling: Secondary | ICD-10-CM | POA: Diagnosis not present

## 2019-08-14 DIAGNOSIS — Z794 Long term (current) use of insulin: Secondary | ICD-10-CM | POA: Diagnosis not present

## 2019-08-14 DIAGNOSIS — E559 Vitamin D deficiency, unspecified: Secondary | ICD-10-CM | POA: Diagnosis not present

## 2019-08-16 DIAGNOSIS — Z9181 History of falling: Secondary | ICD-10-CM | POA: Diagnosis not present

## 2019-08-16 DIAGNOSIS — E612 Magnesium deficiency: Secondary | ICD-10-CM | POA: Diagnosis not present

## 2019-08-16 DIAGNOSIS — L97412 Non-pressure chronic ulcer of right heel and midfoot with fat layer exposed: Secondary | ICD-10-CM | POA: Diagnosis not present

## 2019-08-16 DIAGNOSIS — E1142 Type 2 diabetes mellitus with diabetic polyneuropathy: Secondary | ICD-10-CM | POA: Diagnosis not present

## 2019-08-16 DIAGNOSIS — E559 Vitamin D deficiency, unspecified: Secondary | ICD-10-CM | POA: Diagnosis not present

## 2019-08-16 DIAGNOSIS — D649 Anemia, unspecified: Secondary | ICD-10-CM | POA: Diagnosis not present

## 2019-08-16 DIAGNOSIS — E1165 Type 2 diabetes mellitus with hyperglycemia: Secondary | ICD-10-CM | POA: Diagnosis not present

## 2019-08-16 DIAGNOSIS — I10 Essential (primary) hypertension: Secondary | ICD-10-CM | POA: Diagnosis not present

## 2019-08-16 DIAGNOSIS — E7800 Pure hypercholesterolemia, unspecified: Secondary | ICD-10-CM | POA: Diagnosis not present

## 2019-08-16 DIAGNOSIS — Z79891 Long term (current) use of opiate analgesic: Secondary | ICD-10-CM | POA: Diagnosis not present

## 2019-08-16 DIAGNOSIS — L97522 Non-pressure chronic ulcer of other part of left foot with fat layer exposed: Secondary | ICD-10-CM | POA: Diagnosis not present

## 2019-08-16 DIAGNOSIS — E11621 Type 2 diabetes mellitus with foot ulcer: Secondary | ICD-10-CM | POA: Diagnosis not present

## 2019-08-16 DIAGNOSIS — Z794 Long term (current) use of insulin: Secondary | ICD-10-CM | POA: Diagnosis not present

## 2019-08-16 DIAGNOSIS — J449 Chronic obstructive pulmonary disease, unspecified: Secondary | ICD-10-CM | POA: Diagnosis not present

## 2019-08-16 DIAGNOSIS — K219 Gastro-esophageal reflux disease without esophagitis: Secondary | ICD-10-CM | POA: Diagnosis not present

## 2019-08-16 DIAGNOSIS — Z9981 Dependence on supplemental oxygen: Secondary | ICD-10-CM | POA: Diagnosis not present

## 2019-08-18 DIAGNOSIS — L97529 Non-pressure chronic ulcer of other part of left foot with unspecified severity: Secondary | ICD-10-CM | POA: Diagnosis not present

## 2019-08-18 DIAGNOSIS — E11621 Type 2 diabetes mellitus with foot ulcer: Secondary | ICD-10-CM | POA: Diagnosis not present

## 2019-08-18 DIAGNOSIS — I10 Essential (primary) hypertension: Secondary | ICD-10-CM | POA: Diagnosis not present

## 2019-08-18 DIAGNOSIS — L97419 Non-pressure chronic ulcer of right heel and midfoot with unspecified severity: Secondary | ICD-10-CM | POA: Diagnosis not present

## 2019-08-21 DIAGNOSIS — Z79891 Long term (current) use of opiate analgesic: Secondary | ICD-10-CM | POA: Diagnosis not present

## 2019-08-21 DIAGNOSIS — E1165 Type 2 diabetes mellitus with hyperglycemia: Secondary | ICD-10-CM | POA: Diagnosis not present

## 2019-08-21 DIAGNOSIS — Z9981 Dependence on supplemental oxygen: Secondary | ICD-10-CM | POA: Diagnosis not present

## 2019-08-21 DIAGNOSIS — E612 Magnesium deficiency: Secondary | ICD-10-CM | POA: Diagnosis not present

## 2019-08-21 DIAGNOSIS — Z79899 Other long term (current) drug therapy: Secondary | ICD-10-CM | POA: Diagnosis not present

## 2019-08-21 DIAGNOSIS — J449 Chronic obstructive pulmonary disease, unspecified: Secondary | ICD-10-CM | POA: Diagnosis not present

## 2019-08-21 DIAGNOSIS — I10 Essential (primary) hypertension: Secondary | ICD-10-CM | POA: Diagnosis not present

## 2019-08-21 DIAGNOSIS — E11621 Type 2 diabetes mellitus with foot ulcer: Secondary | ICD-10-CM | POA: Diagnosis not present

## 2019-08-21 DIAGNOSIS — Z9181 History of falling: Secondary | ICD-10-CM | POA: Diagnosis not present

## 2019-08-21 DIAGNOSIS — E7800 Pure hypercholesterolemia, unspecified: Secondary | ICD-10-CM | POA: Diagnosis not present

## 2019-08-21 DIAGNOSIS — D649 Anemia, unspecified: Secondary | ICD-10-CM | POA: Diagnosis not present

## 2019-08-21 DIAGNOSIS — L97412 Non-pressure chronic ulcer of right heel and midfoot with fat layer exposed: Secondary | ICD-10-CM | POA: Diagnosis not present

## 2019-08-21 DIAGNOSIS — Z794 Long term (current) use of insulin: Secondary | ICD-10-CM | POA: Diagnosis not present

## 2019-08-21 DIAGNOSIS — E1142 Type 2 diabetes mellitus with diabetic polyneuropathy: Secondary | ICD-10-CM | POA: Diagnosis not present

## 2019-08-21 DIAGNOSIS — E559 Vitamin D deficiency, unspecified: Secondary | ICD-10-CM | POA: Diagnosis not present

## 2019-08-21 DIAGNOSIS — E1140 Type 2 diabetes mellitus with diabetic neuropathy, unspecified: Secondary | ICD-10-CM | POA: Diagnosis not present

## 2019-08-21 DIAGNOSIS — K219 Gastro-esophageal reflux disease without esophagitis: Secondary | ICD-10-CM | POA: Diagnosis not present

## 2019-08-21 DIAGNOSIS — L97522 Non-pressure chronic ulcer of other part of left foot with fat layer exposed: Secondary | ICD-10-CM | POA: Diagnosis not present

## 2019-08-22 DIAGNOSIS — G894 Chronic pain syndrome: Secondary | ICD-10-CM | POA: Diagnosis not present

## 2019-08-22 DIAGNOSIS — M47816 Spondylosis without myelopathy or radiculopathy, lumbar region: Secondary | ICD-10-CM | POA: Diagnosis not present

## 2019-08-23 DIAGNOSIS — I10 Essential (primary) hypertension: Secondary | ICD-10-CM | POA: Diagnosis not present

## 2019-08-23 DIAGNOSIS — Z794 Long term (current) use of insulin: Secondary | ICD-10-CM | POA: Diagnosis not present

## 2019-08-23 DIAGNOSIS — E1142 Type 2 diabetes mellitus with diabetic polyneuropathy: Secondary | ICD-10-CM | POA: Diagnosis not present

## 2019-08-23 DIAGNOSIS — L97412 Non-pressure chronic ulcer of right heel and midfoot with fat layer exposed: Secondary | ICD-10-CM | POA: Diagnosis not present

## 2019-08-23 DIAGNOSIS — Z79891 Long term (current) use of opiate analgesic: Secondary | ICD-10-CM | POA: Diagnosis not present

## 2019-08-23 DIAGNOSIS — E612 Magnesium deficiency: Secondary | ICD-10-CM | POA: Diagnosis not present

## 2019-08-23 DIAGNOSIS — E559 Vitamin D deficiency, unspecified: Secondary | ICD-10-CM | POA: Diagnosis not present

## 2019-08-23 DIAGNOSIS — K219 Gastro-esophageal reflux disease without esophagitis: Secondary | ICD-10-CM | POA: Diagnosis not present

## 2019-08-23 DIAGNOSIS — Z9181 History of falling: Secondary | ICD-10-CM | POA: Diagnosis not present

## 2019-08-23 DIAGNOSIS — D649 Anemia, unspecified: Secondary | ICD-10-CM | POA: Diagnosis not present

## 2019-08-23 DIAGNOSIS — E11621 Type 2 diabetes mellitus with foot ulcer: Secondary | ICD-10-CM | POA: Diagnosis not present

## 2019-08-23 DIAGNOSIS — Z9981 Dependence on supplemental oxygen: Secondary | ICD-10-CM | POA: Diagnosis not present

## 2019-08-23 DIAGNOSIS — E1165 Type 2 diabetes mellitus with hyperglycemia: Secondary | ICD-10-CM | POA: Diagnosis not present

## 2019-08-23 DIAGNOSIS — L97522 Non-pressure chronic ulcer of other part of left foot with fat layer exposed: Secondary | ICD-10-CM | POA: Diagnosis not present

## 2019-08-23 DIAGNOSIS — E7800 Pure hypercholesterolemia, unspecified: Secondary | ICD-10-CM | POA: Diagnosis not present

## 2019-08-23 DIAGNOSIS — J449 Chronic obstructive pulmonary disease, unspecified: Secondary | ICD-10-CM | POA: Diagnosis not present

## 2019-08-25 DIAGNOSIS — L97522 Non-pressure chronic ulcer of other part of left foot with fat layer exposed: Secondary | ICD-10-CM | POA: Diagnosis not present

## 2019-08-25 DIAGNOSIS — I10 Essential (primary) hypertension: Secondary | ICD-10-CM | POA: Diagnosis not present

## 2019-08-25 DIAGNOSIS — L97419 Non-pressure chronic ulcer of right heel and midfoot with unspecified severity: Secondary | ICD-10-CM | POA: Diagnosis not present

## 2019-08-25 DIAGNOSIS — L97521 Non-pressure chronic ulcer of other part of left foot limited to breakdown of skin: Secondary | ICD-10-CM | POA: Diagnosis not present

## 2019-08-25 DIAGNOSIS — L97529 Non-pressure chronic ulcer of other part of left foot with unspecified severity: Secondary | ICD-10-CM | POA: Diagnosis not present

## 2019-08-25 DIAGNOSIS — E08621 Diabetes mellitus due to underlying condition with foot ulcer: Secondary | ICD-10-CM | POA: Diagnosis not present

## 2019-08-25 DIAGNOSIS — T8189XA Other complications of procedures, not elsewhere classified, initial encounter: Secondary | ICD-10-CM | POA: Diagnosis not present

## 2019-08-25 DIAGNOSIS — E11621 Type 2 diabetes mellitus with foot ulcer: Secondary | ICD-10-CM | POA: Diagnosis not present

## 2019-08-25 DIAGNOSIS — L97409 Non-pressure chronic ulcer of unspecified heel and midfoot with unspecified severity: Secondary | ICD-10-CM | POA: Diagnosis not present

## 2019-08-25 DIAGNOSIS — L97412 Non-pressure chronic ulcer of right heel and midfoot with fat layer exposed: Secondary | ICD-10-CM | POA: Diagnosis not present

## 2019-08-28 DIAGNOSIS — Z79891 Long term (current) use of opiate analgesic: Secondary | ICD-10-CM | POA: Diagnosis not present

## 2019-08-28 DIAGNOSIS — I10 Essential (primary) hypertension: Secondary | ICD-10-CM | POA: Diagnosis not present

## 2019-08-28 DIAGNOSIS — K219 Gastro-esophageal reflux disease without esophagitis: Secondary | ICD-10-CM | POA: Diagnosis not present

## 2019-08-28 DIAGNOSIS — Z794 Long term (current) use of insulin: Secondary | ICD-10-CM | POA: Diagnosis not present

## 2019-08-28 DIAGNOSIS — D649 Anemia, unspecified: Secondary | ICD-10-CM | POA: Diagnosis not present

## 2019-08-28 DIAGNOSIS — Z9181 History of falling: Secondary | ICD-10-CM | POA: Diagnosis not present

## 2019-08-28 DIAGNOSIS — E1165 Type 2 diabetes mellitus with hyperglycemia: Secondary | ICD-10-CM | POA: Diagnosis not present

## 2019-08-28 DIAGNOSIS — Z9981 Dependence on supplemental oxygen: Secondary | ICD-10-CM | POA: Diagnosis not present

## 2019-08-28 DIAGNOSIS — E559 Vitamin D deficiency, unspecified: Secondary | ICD-10-CM | POA: Diagnosis not present

## 2019-08-28 DIAGNOSIS — J449 Chronic obstructive pulmonary disease, unspecified: Secondary | ICD-10-CM | POA: Diagnosis not present

## 2019-08-28 DIAGNOSIS — E1142 Type 2 diabetes mellitus with diabetic polyneuropathy: Secondary | ICD-10-CM | POA: Diagnosis not present

## 2019-08-28 DIAGNOSIS — E7800 Pure hypercholesterolemia, unspecified: Secondary | ICD-10-CM | POA: Diagnosis not present

## 2019-08-28 DIAGNOSIS — E612 Magnesium deficiency: Secondary | ICD-10-CM | POA: Diagnosis not present

## 2019-08-28 DIAGNOSIS — E11621 Type 2 diabetes mellitus with foot ulcer: Secondary | ICD-10-CM | POA: Diagnosis not present

## 2019-08-28 DIAGNOSIS — L97522 Non-pressure chronic ulcer of other part of left foot with fat layer exposed: Secondary | ICD-10-CM | POA: Diagnosis not present

## 2019-08-28 DIAGNOSIS — L97412 Non-pressure chronic ulcer of right heel and midfoot with fat layer exposed: Secondary | ICD-10-CM | POA: Diagnosis not present

## 2019-08-30 DIAGNOSIS — Z79891 Long term (current) use of opiate analgesic: Secondary | ICD-10-CM | POA: Diagnosis not present

## 2019-08-30 DIAGNOSIS — I10 Essential (primary) hypertension: Secondary | ICD-10-CM | POA: Diagnosis not present

## 2019-08-30 DIAGNOSIS — Z794 Long term (current) use of insulin: Secondary | ICD-10-CM | POA: Diagnosis not present

## 2019-08-30 DIAGNOSIS — L97522 Non-pressure chronic ulcer of other part of left foot with fat layer exposed: Secondary | ICD-10-CM | POA: Diagnosis not present

## 2019-08-30 DIAGNOSIS — E612 Magnesium deficiency: Secondary | ICD-10-CM | POA: Diagnosis not present

## 2019-08-30 DIAGNOSIS — L97412 Non-pressure chronic ulcer of right heel and midfoot with fat layer exposed: Secondary | ICD-10-CM | POA: Diagnosis not present

## 2019-08-30 DIAGNOSIS — E559 Vitamin D deficiency, unspecified: Secondary | ICD-10-CM | POA: Diagnosis not present

## 2019-08-30 DIAGNOSIS — E11621 Type 2 diabetes mellitus with foot ulcer: Secondary | ICD-10-CM | POA: Diagnosis not present

## 2019-08-30 DIAGNOSIS — K219 Gastro-esophageal reflux disease without esophagitis: Secondary | ICD-10-CM | POA: Diagnosis not present

## 2019-08-30 DIAGNOSIS — Z9981 Dependence on supplemental oxygen: Secondary | ICD-10-CM | POA: Diagnosis not present

## 2019-08-30 DIAGNOSIS — E7800 Pure hypercholesterolemia, unspecified: Secondary | ICD-10-CM | POA: Diagnosis not present

## 2019-08-30 DIAGNOSIS — Z9181 History of falling: Secondary | ICD-10-CM | POA: Diagnosis not present

## 2019-08-30 DIAGNOSIS — J449 Chronic obstructive pulmonary disease, unspecified: Secondary | ICD-10-CM | POA: Diagnosis not present

## 2019-08-30 DIAGNOSIS — E1142 Type 2 diabetes mellitus with diabetic polyneuropathy: Secondary | ICD-10-CM | POA: Diagnosis not present

## 2019-08-30 DIAGNOSIS — D649 Anemia, unspecified: Secondary | ICD-10-CM | POA: Diagnosis not present

## 2019-08-30 DIAGNOSIS — E1165 Type 2 diabetes mellitus with hyperglycemia: Secondary | ICD-10-CM | POA: Diagnosis not present

## 2019-09-01 DIAGNOSIS — E7800 Pure hypercholesterolemia, unspecified: Secondary | ICD-10-CM | POA: Diagnosis not present

## 2019-09-01 DIAGNOSIS — Z79891 Long term (current) use of opiate analgesic: Secondary | ICD-10-CM | POA: Diagnosis not present

## 2019-09-01 DIAGNOSIS — L97412 Non-pressure chronic ulcer of right heel and midfoot with fat layer exposed: Secondary | ICD-10-CM | POA: Diagnosis not present

## 2019-09-01 DIAGNOSIS — E612 Magnesium deficiency: Secondary | ICD-10-CM | POA: Diagnosis not present

## 2019-09-01 DIAGNOSIS — E1165 Type 2 diabetes mellitus with hyperglycemia: Secondary | ICD-10-CM | POA: Diagnosis not present

## 2019-09-01 DIAGNOSIS — I10 Essential (primary) hypertension: Secondary | ICD-10-CM | POA: Diagnosis not present

## 2019-09-01 DIAGNOSIS — Z9181 History of falling: Secondary | ICD-10-CM | POA: Diagnosis not present

## 2019-09-01 DIAGNOSIS — J449 Chronic obstructive pulmonary disease, unspecified: Secondary | ICD-10-CM | POA: Diagnosis not present

## 2019-09-01 DIAGNOSIS — E1142 Type 2 diabetes mellitus with diabetic polyneuropathy: Secondary | ICD-10-CM | POA: Diagnosis not present

## 2019-09-01 DIAGNOSIS — L97522 Non-pressure chronic ulcer of other part of left foot with fat layer exposed: Secondary | ICD-10-CM | POA: Diagnosis not present

## 2019-09-01 DIAGNOSIS — K219 Gastro-esophageal reflux disease without esophagitis: Secondary | ICD-10-CM | POA: Diagnosis not present

## 2019-09-01 DIAGNOSIS — E11621 Type 2 diabetes mellitus with foot ulcer: Secondary | ICD-10-CM | POA: Diagnosis not present

## 2019-09-01 DIAGNOSIS — Z794 Long term (current) use of insulin: Secondary | ICD-10-CM | POA: Diagnosis not present

## 2019-09-01 DIAGNOSIS — Z9981 Dependence on supplemental oxygen: Secondary | ICD-10-CM | POA: Diagnosis not present

## 2019-09-01 DIAGNOSIS — D649 Anemia, unspecified: Secondary | ICD-10-CM | POA: Diagnosis not present

## 2019-09-01 DIAGNOSIS — E559 Vitamin D deficiency, unspecified: Secondary | ICD-10-CM | POA: Diagnosis not present

## 2019-09-04 DIAGNOSIS — E11621 Type 2 diabetes mellitus with foot ulcer: Secondary | ICD-10-CM | POA: Diagnosis not present

## 2019-09-04 DIAGNOSIS — L97412 Non-pressure chronic ulcer of right heel and midfoot with fat layer exposed: Secondary | ICD-10-CM | POA: Diagnosis not present

## 2019-09-04 DIAGNOSIS — I10 Essential (primary) hypertension: Secondary | ICD-10-CM | POA: Diagnosis not present

## 2019-09-04 DIAGNOSIS — L97522 Non-pressure chronic ulcer of other part of left foot with fat layer exposed: Secondary | ICD-10-CM | POA: Diagnosis not present

## 2019-09-04 DIAGNOSIS — L97529 Non-pressure chronic ulcer of other part of left foot with unspecified severity: Secondary | ICD-10-CM | POA: Diagnosis not present

## 2019-09-04 DIAGNOSIS — T8189XA Other complications of procedures, not elsewhere classified, initial encounter: Secondary | ICD-10-CM | POA: Diagnosis not present

## 2019-09-04 DIAGNOSIS — L97419 Non-pressure chronic ulcer of right heel and midfoot with unspecified severity: Secondary | ICD-10-CM | POA: Diagnosis not present

## 2019-09-06 DIAGNOSIS — E11621 Type 2 diabetes mellitus with foot ulcer: Secondary | ICD-10-CM | POA: Diagnosis not present

## 2019-09-06 DIAGNOSIS — Z794 Long term (current) use of insulin: Secondary | ICD-10-CM | POA: Diagnosis not present

## 2019-09-06 DIAGNOSIS — D649 Anemia, unspecified: Secondary | ICD-10-CM | POA: Diagnosis not present

## 2019-09-06 DIAGNOSIS — Z9181 History of falling: Secondary | ICD-10-CM | POA: Diagnosis not present

## 2019-09-06 DIAGNOSIS — E559 Vitamin D deficiency, unspecified: Secondary | ICD-10-CM | POA: Diagnosis not present

## 2019-09-06 DIAGNOSIS — J449 Chronic obstructive pulmonary disease, unspecified: Secondary | ICD-10-CM | POA: Diagnosis not present

## 2019-09-06 DIAGNOSIS — E612 Magnesium deficiency: Secondary | ICD-10-CM | POA: Diagnosis not present

## 2019-09-06 DIAGNOSIS — E7800 Pure hypercholesterolemia, unspecified: Secondary | ICD-10-CM | POA: Diagnosis not present

## 2019-09-06 DIAGNOSIS — E1165 Type 2 diabetes mellitus with hyperglycemia: Secondary | ICD-10-CM | POA: Diagnosis not present

## 2019-09-06 DIAGNOSIS — L97522 Non-pressure chronic ulcer of other part of left foot with fat layer exposed: Secondary | ICD-10-CM | POA: Diagnosis not present

## 2019-09-06 DIAGNOSIS — Z79891 Long term (current) use of opiate analgesic: Secondary | ICD-10-CM | POA: Diagnosis not present

## 2019-09-06 DIAGNOSIS — E1142 Type 2 diabetes mellitus with diabetic polyneuropathy: Secondary | ICD-10-CM | POA: Diagnosis not present

## 2019-09-06 DIAGNOSIS — Z9981 Dependence on supplemental oxygen: Secondary | ICD-10-CM | POA: Diagnosis not present

## 2019-09-06 DIAGNOSIS — K219 Gastro-esophageal reflux disease without esophagitis: Secondary | ICD-10-CM | POA: Diagnosis not present

## 2019-09-06 DIAGNOSIS — I10 Essential (primary) hypertension: Secondary | ICD-10-CM | POA: Diagnosis not present

## 2019-09-06 DIAGNOSIS — L97412 Non-pressure chronic ulcer of right heel and midfoot with fat layer exposed: Secondary | ICD-10-CM | POA: Diagnosis not present

## 2019-09-08 DIAGNOSIS — Z794 Long term (current) use of insulin: Secondary | ICD-10-CM | POA: Diagnosis not present

## 2019-09-08 DIAGNOSIS — D649 Anemia, unspecified: Secondary | ICD-10-CM | POA: Diagnosis not present

## 2019-09-08 DIAGNOSIS — Z79891 Long term (current) use of opiate analgesic: Secondary | ICD-10-CM | POA: Diagnosis not present

## 2019-09-08 DIAGNOSIS — I10 Essential (primary) hypertension: Secondary | ICD-10-CM | POA: Diagnosis not present

## 2019-09-08 DIAGNOSIS — J449 Chronic obstructive pulmonary disease, unspecified: Secondary | ICD-10-CM | POA: Diagnosis not present

## 2019-09-08 DIAGNOSIS — E1165 Type 2 diabetes mellitus with hyperglycemia: Secondary | ICD-10-CM | POA: Diagnosis not present

## 2019-09-08 DIAGNOSIS — L97412 Non-pressure chronic ulcer of right heel and midfoot with fat layer exposed: Secondary | ICD-10-CM | POA: Diagnosis not present

## 2019-09-08 DIAGNOSIS — L97522 Non-pressure chronic ulcer of other part of left foot with fat layer exposed: Secondary | ICD-10-CM | POA: Diagnosis not present

## 2019-09-08 DIAGNOSIS — E559 Vitamin D deficiency, unspecified: Secondary | ICD-10-CM | POA: Diagnosis not present

## 2019-09-08 DIAGNOSIS — Z9981 Dependence on supplemental oxygen: Secondary | ICD-10-CM | POA: Diagnosis not present

## 2019-09-08 DIAGNOSIS — Z9181 History of falling: Secondary | ICD-10-CM | POA: Diagnosis not present

## 2019-09-08 DIAGNOSIS — E612 Magnesium deficiency: Secondary | ICD-10-CM | POA: Diagnosis not present

## 2019-09-08 DIAGNOSIS — E7800 Pure hypercholesterolemia, unspecified: Secondary | ICD-10-CM | POA: Diagnosis not present

## 2019-09-08 DIAGNOSIS — K219 Gastro-esophageal reflux disease without esophagitis: Secondary | ICD-10-CM | POA: Diagnosis not present

## 2019-09-08 DIAGNOSIS — E1142 Type 2 diabetes mellitus with diabetic polyneuropathy: Secondary | ICD-10-CM | POA: Diagnosis not present

## 2019-09-08 DIAGNOSIS — E11621 Type 2 diabetes mellitus with foot ulcer: Secondary | ICD-10-CM | POA: Diagnosis not present

## 2019-09-09 IMAGING — DX DG CHEST 1V PORT
1 series · 1 of 1 positions shown · non-contrast
Comparison: 01/04/2017

CLINICAL DATA: Shortness of breath

EXAM:
PORTABLE CHEST 1 VIEW

[chest ap]
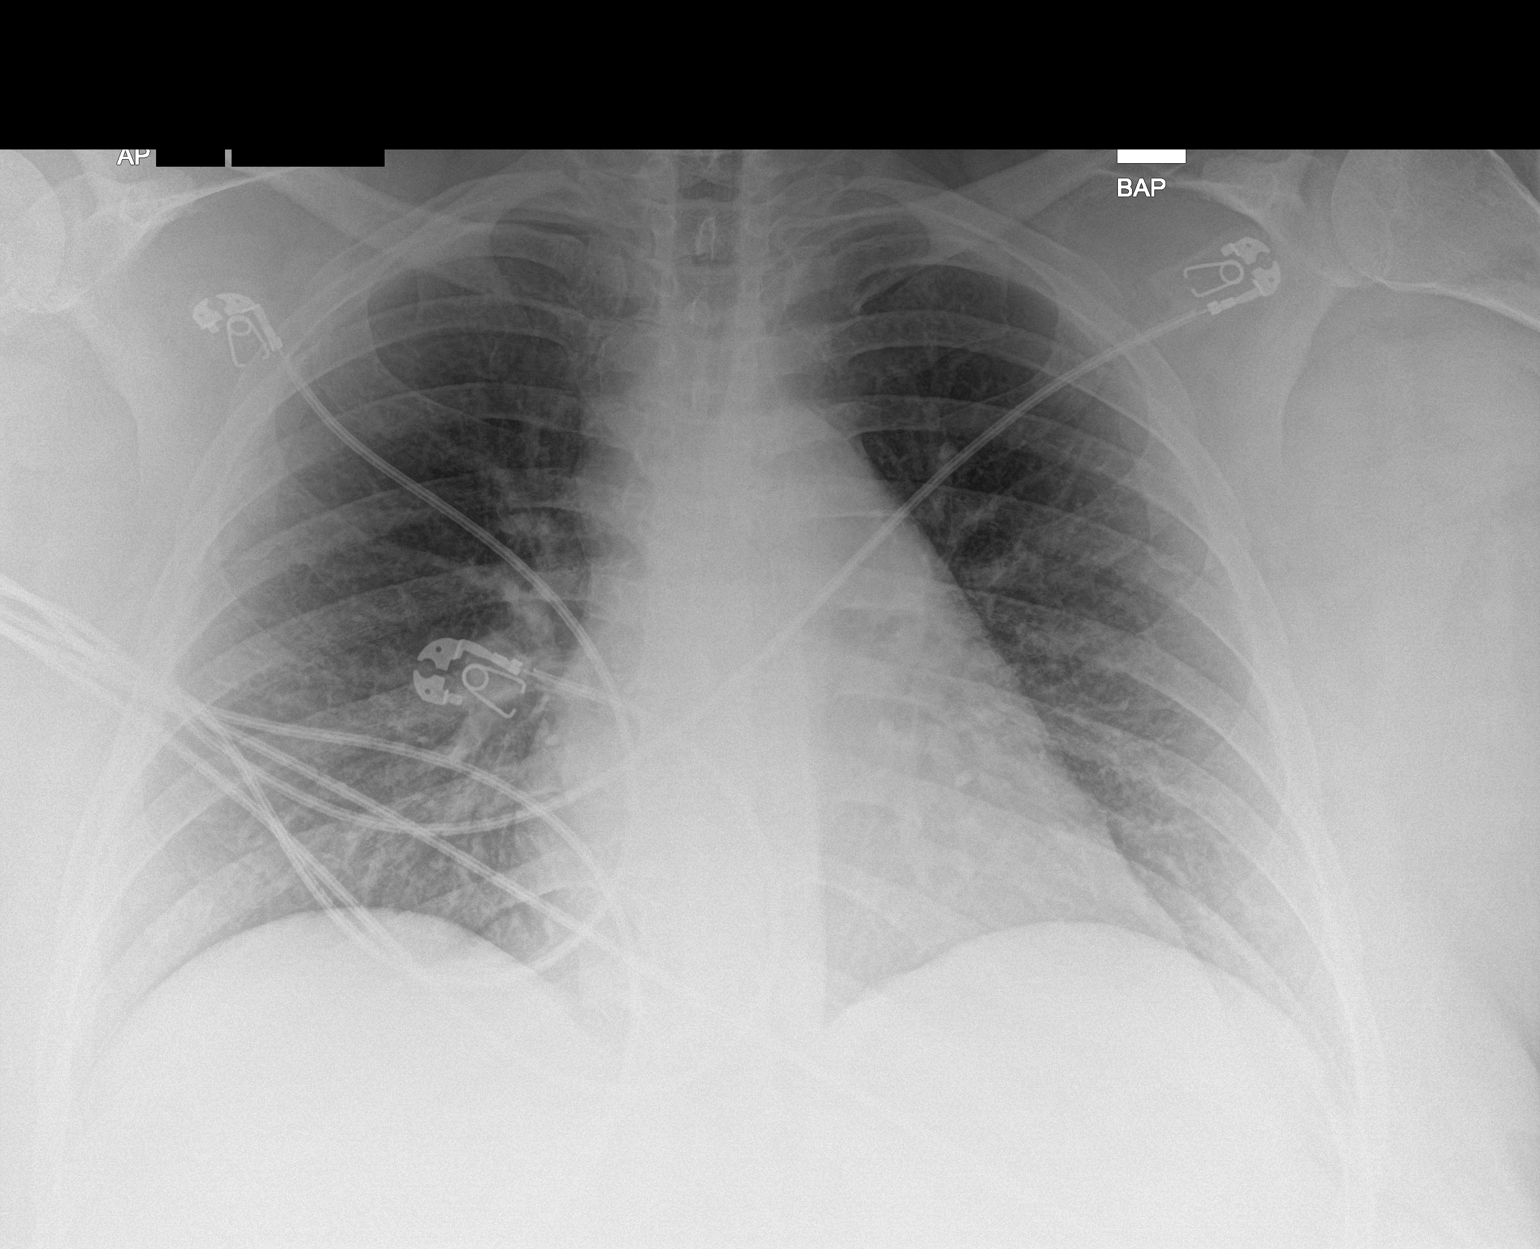

[1 of 1 positions shown; findings below may reference images not displayed]

FINDINGS: The heart size and mediastinal contours are within normal limits.
Both lungs are clear. The visualized skeletal structures are
unremarkable.
IMPRESSION: No active disease.

## 2019-09-10 IMAGING — MR MR FOOT*R* WO/W CM
5 of 9 series · 19 of 40 positions shown · IV contrast (Yes)
Comparison: Radiographs 04/19/2018 and 04/21/2018

CLINICAL DATA: Chronic heel ulcer with redness, swelling and
drainage.

EXAM:
MRI OF THE RIGHT FOREFOOT WITHOUT AND WITH CONTRAST
TECHNIQUE: Multiplanar, multisequence MR imaging of the right hindfoot was
performed before and after the administration of intravenous
contrast.
CONTRAST:  20mL MULTIHANCE GADOBENATE DIMEGLUMINE 529 MG/ML IV SOLN

[Series 4: T1 · coronal · 3.0mm · 0.31mm/px · 6 of 35 slices shown (1 of 2)]
[im 1/35]
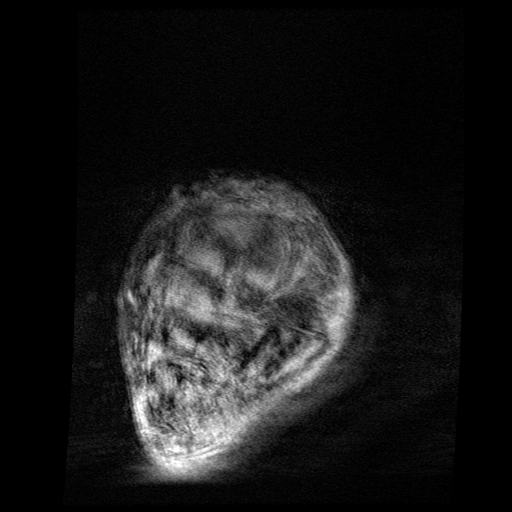
[im 7/35]
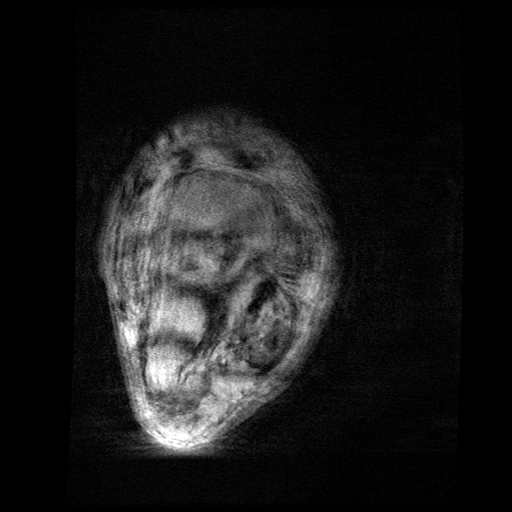
[im 14/35]
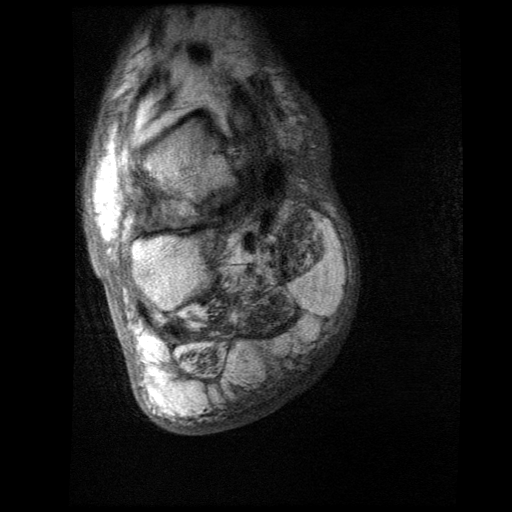
[im 21/35]
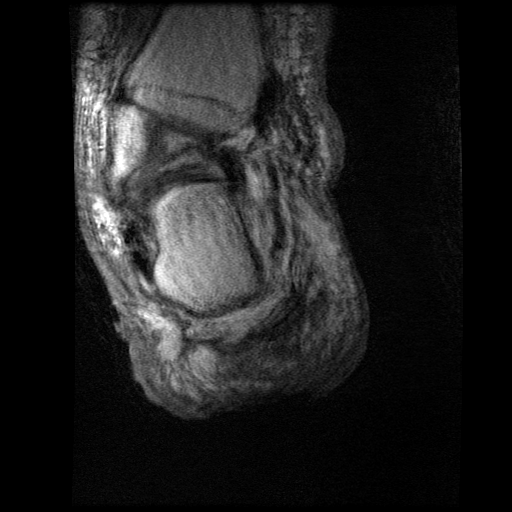
[im 28/35]
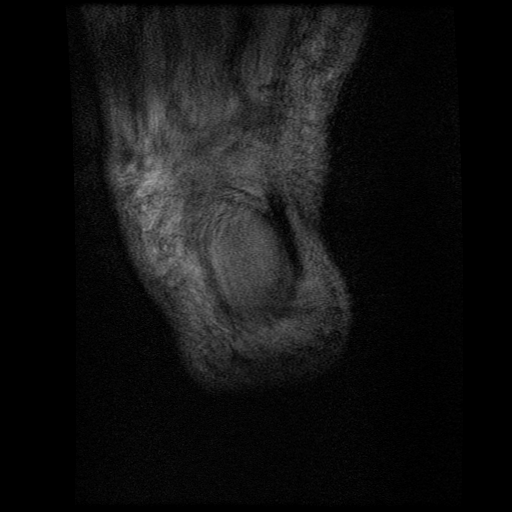
[im 35/35]
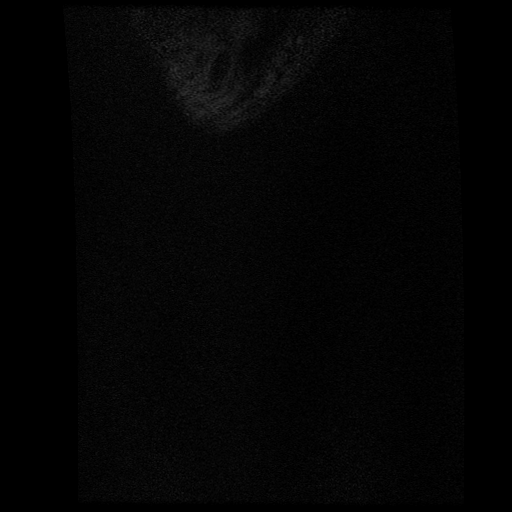

[Series 7: T2 fat-sat · axial · 3.0mm · 0.29mm/px · z∈[-42,+58]mm · 4 of 30 slices shown]
[im 1/30]
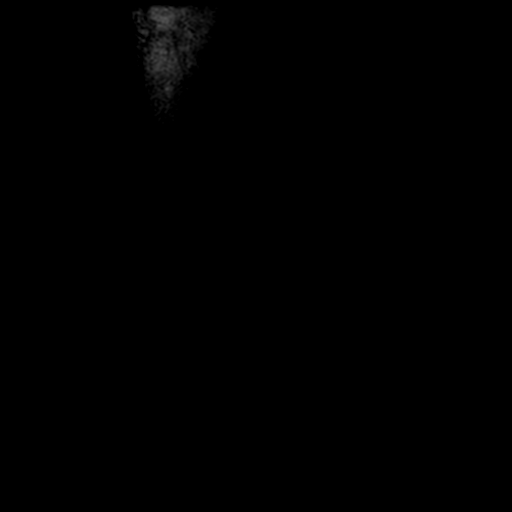
[im 10/30]
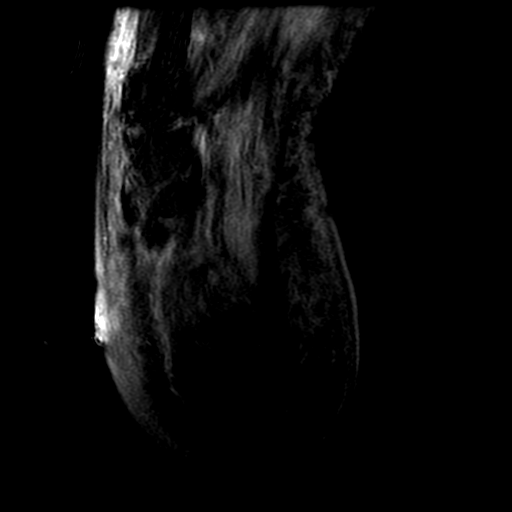
[im 20/30]
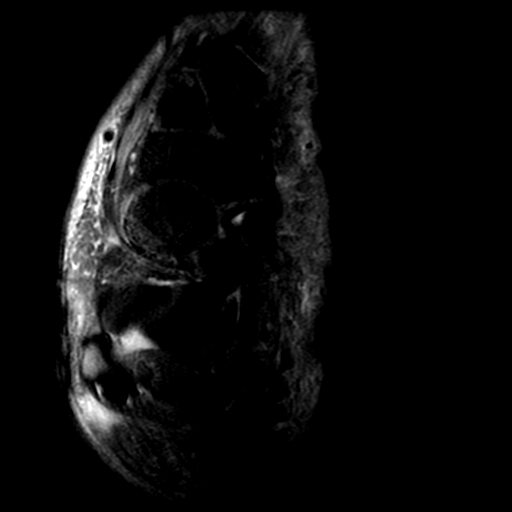
[im 30/30]
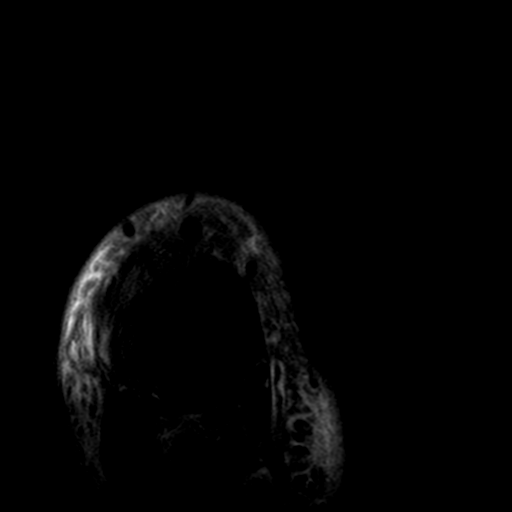

[Series 8: T1 · axial · 3.0mm · 0.29mm/px · z∈[-42,+58]mm · 4 of 30 slices shown (2 of 2)]
[im 1/30]
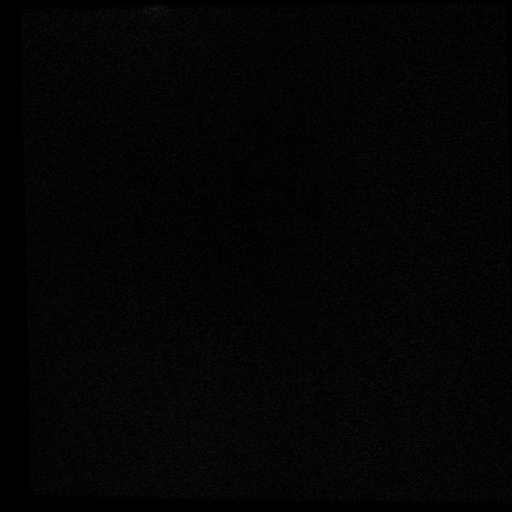
[im 10/30]
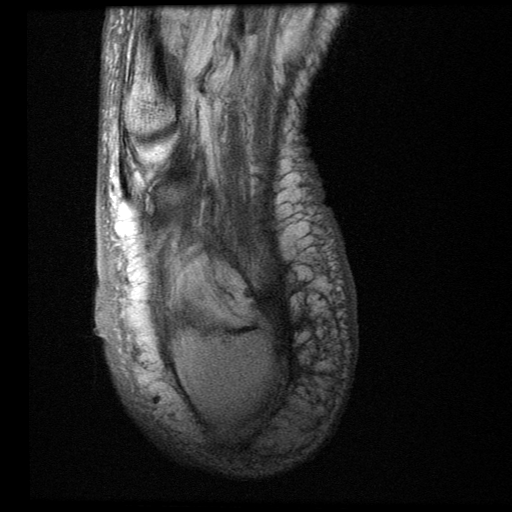
[im 20/30]
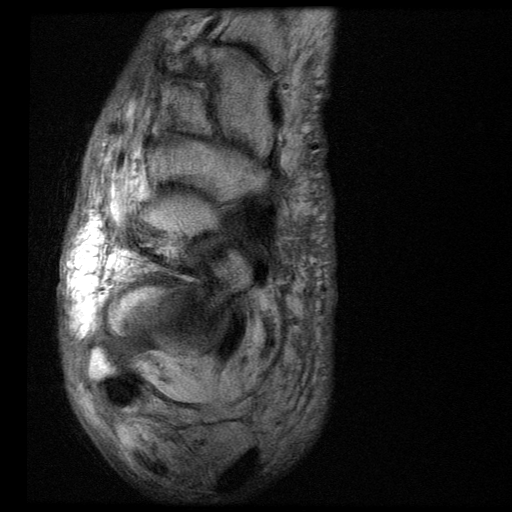
[im 30/30]
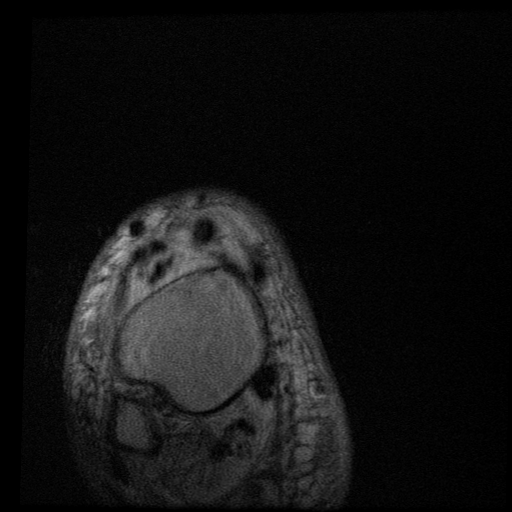

[Series 9: T1 fat-sat · axial · non-contrast · 3.0mm · 0.29mm/px · z∈[-42,+58]mm · 4 of 30 slices shown (1 of 2)]
[im 1/30]
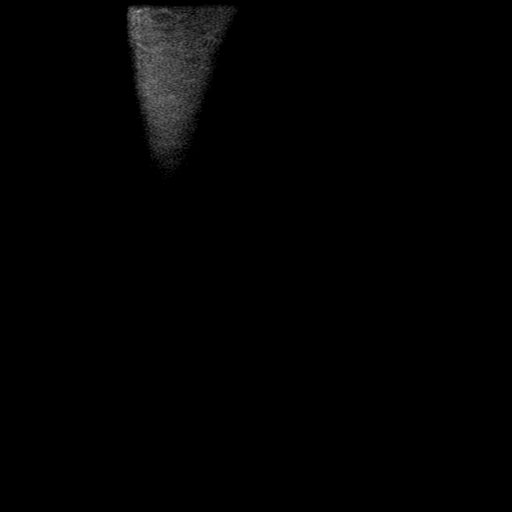
[im 10/30]
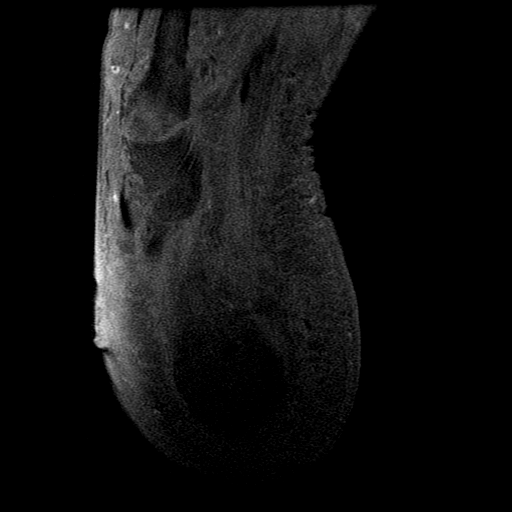
[im 20/30]
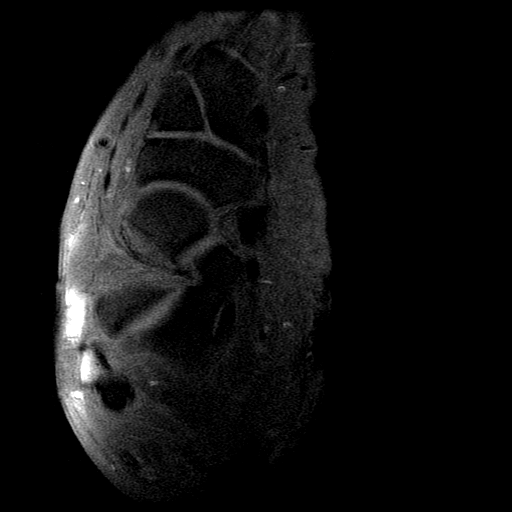
[im 30/30]
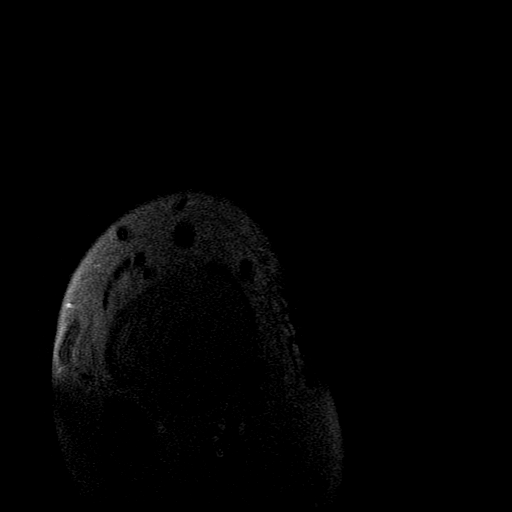

[Series 10: T1 fat-sat · axial · 3.0mm · 0.29mm/px · 1 of 30 slices shown (2 of 2)]
[im 1/30]
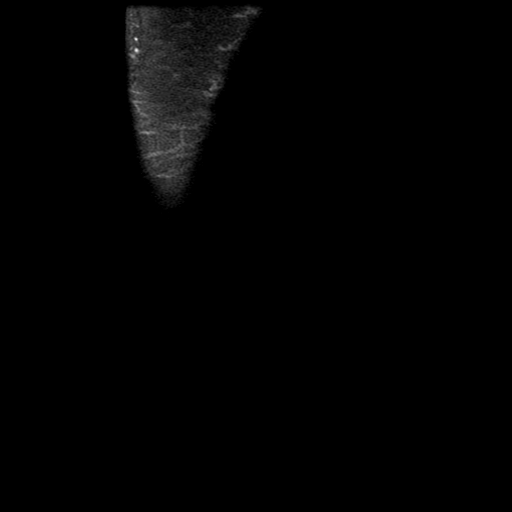

[19 of 40 positions shown; findings below may reference images not displayed]

FINDINGS: Examination is severely limited by patient motion.

There is a large ulcer on the plantar aspect of the heel with
underlying inflammation/edema and enhancement suggesting cellulitis.
No discrete drainable soft tissue abscess is identified. There is
diffuse myofasciitis but no definite findings for pyomyositis. No
obvious findings for septic arthritis or osteomyelitis.

On the comparison radiographs from [DATE] and 04/21/2018 there is a
fracture involving the middle phalanx of the second toe with
displacement and collapse. This was not mentioned on the prior
reports.
IMPRESSION: 1. Very limited examination.
2. Large heel ulcer with cellulitis and myofasciitis but no discrete
drainable soft tissue abscess, pyomyositis, septic arthritis or
osteomyelitis.
3. Although not covered on this examination note is made of a
fracture involving the middle phalanx of the second toe on recent
x-rays.

## 2019-09-11 DIAGNOSIS — T8189XA Other complications of procedures, not elsewhere classified, initial encounter: Secondary | ICD-10-CM | POA: Diagnosis not present

## 2019-09-11 DIAGNOSIS — L97412 Non-pressure chronic ulcer of right heel and midfoot with fat layer exposed: Secondary | ICD-10-CM | POA: Diagnosis not present

## 2019-09-11 DIAGNOSIS — L97522 Non-pressure chronic ulcer of other part of left foot with fat layer exposed: Secondary | ICD-10-CM | POA: Diagnosis not present

## 2019-09-11 DIAGNOSIS — L97419 Non-pressure chronic ulcer of right heel and midfoot with unspecified severity: Secondary | ICD-10-CM | POA: Diagnosis not present

## 2019-09-11 DIAGNOSIS — I10 Essential (primary) hypertension: Secondary | ICD-10-CM | POA: Diagnosis not present

## 2019-09-11 DIAGNOSIS — L97529 Non-pressure chronic ulcer of other part of left foot with unspecified severity: Secondary | ICD-10-CM | POA: Diagnosis not present

## 2019-09-11 DIAGNOSIS — E11621 Type 2 diabetes mellitus with foot ulcer: Secondary | ICD-10-CM | POA: Diagnosis not present

## 2019-09-13 DIAGNOSIS — Z9981 Dependence on supplemental oxygen: Secondary | ICD-10-CM | POA: Diagnosis not present

## 2019-09-13 DIAGNOSIS — K219 Gastro-esophageal reflux disease without esophagitis: Secondary | ICD-10-CM | POA: Diagnosis not present

## 2019-09-13 DIAGNOSIS — E1142 Type 2 diabetes mellitus with diabetic polyneuropathy: Secondary | ICD-10-CM | POA: Diagnosis not present

## 2019-09-13 DIAGNOSIS — E11621 Type 2 diabetes mellitus with foot ulcer: Secondary | ICD-10-CM | POA: Diagnosis not present

## 2019-09-13 DIAGNOSIS — L97412 Non-pressure chronic ulcer of right heel and midfoot with fat layer exposed: Secondary | ICD-10-CM | POA: Diagnosis not present

## 2019-09-13 DIAGNOSIS — Z79891 Long term (current) use of opiate analgesic: Secondary | ICD-10-CM | POA: Diagnosis not present

## 2019-09-13 DIAGNOSIS — E559 Vitamin D deficiency, unspecified: Secondary | ICD-10-CM | POA: Diagnosis not present

## 2019-09-13 DIAGNOSIS — E1165 Type 2 diabetes mellitus with hyperglycemia: Secondary | ICD-10-CM | POA: Diagnosis not present

## 2019-09-13 DIAGNOSIS — E612 Magnesium deficiency: Secondary | ICD-10-CM | POA: Diagnosis not present

## 2019-09-13 DIAGNOSIS — E7800 Pure hypercholesterolemia, unspecified: Secondary | ICD-10-CM | POA: Diagnosis not present

## 2019-09-13 DIAGNOSIS — L97522 Non-pressure chronic ulcer of other part of left foot with fat layer exposed: Secondary | ICD-10-CM | POA: Diagnosis not present

## 2019-09-13 DIAGNOSIS — I10 Essential (primary) hypertension: Secondary | ICD-10-CM | POA: Diagnosis not present

## 2019-09-13 DIAGNOSIS — Z794 Long term (current) use of insulin: Secondary | ICD-10-CM | POA: Diagnosis not present

## 2019-09-13 DIAGNOSIS — D649 Anemia, unspecified: Secondary | ICD-10-CM | POA: Diagnosis not present

## 2019-09-13 DIAGNOSIS — J449 Chronic obstructive pulmonary disease, unspecified: Secondary | ICD-10-CM | POA: Diagnosis not present

## 2019-09-13 DIAGNOSIS — Z9181 History of falling: Secondary | ICD-10-CM | POA: Diagnosis not present

## 2019-09-18 DIAGNOSIS — L97522 Non-pressure chronic ulcer of other part of left foot with fat layer exposed: Secondary | ICD-10-CM | POA: Diagnosis not present

## 2019-09-18 DIAGNOSIS — E11621 Type 2 diabetes mellitus with foot ulcer: Secondary | ICD-10-CM | POA: Diagnosis not present

## 2019-09-18 DIAGNOSIS — T8189XA Other complications of procedures, not elsewhere classified, initial encounter: Secondary | ICD-10-CM | POA: Diagnosis not present

## 2019-09-18 DIAGNOSIS — L97412 Non-pressure chronic ulcer of right heel and midfoot with fat layer exposed: Secondary | ICD-10-CM | POA: Diagnosis not present

## 2019-09-18 DIAGNOSIS — I10 Essential (primary) hypertension: Secondary | ICD-10-CM | POA: Diagnosis not present

## 2019-09-19 DIAGNOSIS — M47816 Spondylosis without myelopathy or radiculopathy, lumbar region: Secondary | ICD-10-CM | POA: Diagnosis not present

## 2019-09-19 DIAGNOSIS — G894 Chronic pain syndrome: Secondary | ICD-10-CM | POA: Diagnosis not present

## 2019-09-20 DIAGNOSIS — L97522 Non-pressure chronic ulcer of other part of left foot with fat layer exposed: Secondary | ICD-10-CM | POA: Diagnosis not present

## 2019-09-20 DIAGNOSIS — Z9981 Dependence on supplemental oxygen: Secondary | ICD-10-CM | POA: Diagnosis not present

## 2019-09-20 DIAGNOSIS — L97412 Non-pressure chronic ulcer of right heel and midfoot with fat layer exposed: Secondary | ICD-10-CM | POA: Diagnosis not present

## 2019-09-20 DIAGNOSIS — Z79891 Long term (current) use of opiate analgesic: Secondary | ICD-10-CM | POA: Diagnosis not present

## 2019-09-20 DIAGNOSIS — E1165 Type 2 diabetes mellitus with hyperglycemia: Secondary | ICD-10-CM | POA: Diagnosis not present

## 2019-09-20 DIAGNOSIS — I10 Essential (primary) hypertension: Secondary | ICD-10-CM | POA: Diagnosis not present

## 2019-09-20 DIAGNOSIS — E1142 Type 2 diabetes mellitus with diabetic polyneuropathy: Secondary | ICD-10-CM | POA: Diagnosis not present

## 2019-09-20 DIAGNOSIS — E7800 Pure hypercholesterolemia, unspecified: Secondary | ICD-10-CM | POA: Diagnosis not present

## 2019-09-20 DIAGNOSIS — D649 Anemia, unspecified: Secondary | ICD-10-CM | POA: Diagnosis not present

## 2019-09-20 DIAGNOSIS — Z9181 History of falling: Secondary | ICD-10-CM | POA: Diagnosis not present

## 2019-09-20 DIAGNOSIS — K219 Gastro-esophageal reflux disease without esophagitis: Secondary | ICD-10-CM | POA: Diagnosis not present

## 2019-09-20 DIAGNOSIS — J449 Chronic obstructive pulmonary disease, unspecified: Secondary | ICD-10-CM | POA: Diagnosis not present

## 2019-09-20 DIAGNOSIS — E612 Magnesium deficiency: Secondary | ICD-10-CM | POA: Diagnosis not present

## 2019-09-20 DIAGNOSIS — Z794 Long term (current) use of insulin: Secondary | ICD-10-CM | POA: Diagnosis not present

## 2019-09-20 DIAGNOSIS — E11621 Type 2 diabetes mellitus with foot ulcer: Secondary | ICD-10-CM | POA: Diagnosis not present

## 2019-09-20 DIAGNOSIS — E559 Vitamin D deficiency, unspecified: Secondary | ICD-10-CM | POA: Diagnosis not present

## 2019-09-22 DIAGNOSIS — Z9981 Dependence on supplemental oxygen: Secondary | ICD-10-CM | POA: Diagnosis not present

## 2019-09-22 DIAGNOSIS — L97412 Non-pressure chronic ulcer of right heel and midfoot with fat layer exposed: Secondary | ICD-10-CM | POA: Diagnosis not present

## 2019-09-22 DIAGNOSIS — E612 Magnesium deficiency: Secondary | ICD-10-CM | POA: Diagnosis not present

## 2019-09-22 DIAGNOSIS — E1165 Type 2 diabetes mellitus with hyperglycemia: Secondary | ICD-10-CM | POA: Diagnosis not present

## 2019-09-22 DIAGNOSIS — L97522 Non-pressure chronic ulcer of other part of left foot with fat layer exposed: Secondary | ICD-10-CM | POA: Diagnosis not present

## 2019-09-22 DIAGNOSIS — E7800 Pure hypercholesterolemia, unspecified: Secondary | ICD-10-CM | POA: Diagnosis not present

## 2019-09-22 DIAGNOSIS — D649 Anemia, unspecified: Secondary | ICD-10-CM | POA: Diagnosis not present

## 2019-09-22 DIAGNOSIS — Z9181 History of falling: Secondary | ICD-10-CM | POA: Diagnosis not present

## 2019-09-22 DIAGNOSIS — E11621 Type 2 diabetes mellitus with foot ulcer: Secondary | ICD-10-CM | POA: Diagnosis not present

## 2019-09-22 DIAGNOSIS — E559 Vitamin D deficiency, unspecified: Secondary | ICD-10-CM | POA: Diagnosis not present

## 2019-09-22 DIAGNOSIS — J449 Chronic obstructive pulmonary disease, unspecified: Secondary | ICD-10-CM | POA: Diagnosis not present

## 2019-09-22 DIAGNOSIS — I10 Essential (primary) hypertension: Secondary | ICD-10-CM | POA: Diagnosis not present

## 2019-09-22 DIAGNOSIS — E1142 Type 2 diabetes mellitus with diabetic polyneuropathy: Secondary | ICD-10-CM | POA: Diagnosis not present

## 2019-09-22 DIAGNOSIS — K219 Gastro-esophageal reflux disease without esophagitis: Secondary | ICD-10-CM | POA: Diagnosis not present

## 2019-09-22 DIAGNOSIS — Z794 Long term (current) use of insulin: Secondary | ICD-10-CM | POA: Diagnosis not present

## 2019-09-22 DIAGNOSIS — Z79891 Long term (current) use of opiate analgesic: Secondary | ICD-10-CM | POA: Diagnosis not present

## 2019-09-25 DIAGNOSIS — N289 Disorder of kidney and ureter, unspecified: Secondary | ICD-10-CM | POA: Diagnosis not present

## 2019-09-25 DIAGNOSIS — I10 Essential (primary) hypertension: Secondary | ICD-10-CM | POA: Diagnosis not present

## 2019-09-25 DIAGNOSIS — E11621 Type 2 diabetes mellitus with foot ulcer: Secondary | ICD-10-CM | POA: Diagnosis not present

## 2019-09-25 DIAGNOSIS — L97412 Non-pressure chronic ulcer of right heel and midfoot with fat layer exposed: Secondary | ICD-10-CM | POA: Diagnosis not present

## 2019-09-25 DIAGNOSIS — L97522 Non-pressure chronic ulcer of other part of left foot with fat layer exposed: Secondary | ICD-10-CM | POA: Diagnosis not present

## 2019-09-28 DIAGNOSIS — I10 Essential (primary) hypertension: Secondary | ICD-10-CM | POA: Diagnosis not present

## 2019-09-28 DIAGNOSIS — E612 Magnesium deficiency: Secondary | ICD-10-CM | POA: Diagnosis not present

## 2019-09-28 DIAGNOSIS — Z9981 Dependence on supplemental oxygen: Secondary | ICD-10-CM | POA: Diagnosis not present

## 2019-09-28 DIAGNOSIS — E559 Vitamin D deficiency, unspecified: Secondary | ICD-10-CM | POA: Diagnosis not present

## 2019-09-28 DIAGNOSIS — Z794 Long term (current) use of insulin: Secondary | ICD-10-CM | POA: Diagnosis not present

## 2019-09-28 DIAGNOSIS — Z79891 Long term (current) use of opiate analgesic: Secondary | ICD-10-CM | POA: Diagnosis not present

## 2019-09-28 DIAGNOSIS — D649 Anemia, unspecified: Secondary | ICD-10-CM | POA: Diagnosis not present

## 2019-09-28 DIAGNOSIS — E11621 Type 2 diabetes mellitus with foot ulcer: Secondary | ICD-10-CM | POA: Diagnosis not present

## 2019-09-28 DIAGNOSIS — E7800 Pure hypercholesterolemia, unspecified: Secondary | ICD-10-CM | POA: Diagnosis not present

## 2019-09-28 DIAGNOSIS — L97522 Non-pressure chronic ulcer of other part of left foot with fat layer exposed: Secondary | ICD-10-CM | POA: Diagnosis not present

## 2019-09-28 DIAGNOSIS — E1142 Type 2 diabetes mellitus with diabetic polyneuropathy: Secondary | ICD-10-CM | POA: Diagnosis not present

## 2019-09-28 DIAGNOSIS — J449 Chronic obstructive pulmonary disease, unspecified: Secondary | ICD-10-CM | POA: Diagnosis not present

## 2019-09-28 DIAGNOSIS — E1165 Type 2 diabetes mellitus with hyperglycemia: Secondary | ICD-10-CM | POA: Diagnosis not present

## 2019-09-28 DIAGNOSIS — K219 Gastro-esophageal reflux disease without esophagitis: Secondary | ICD-10-CM | POA: Diagnosis not present

## 2019-09-28 DIAGNOSIS — Z9181 History of falling: Secondary | ICD-10-CM | POA: Diagnosis not present

## 2019-09-28 DIAGNOSIS — L97412 Non-pressure chronic ulcer of right heel and midfoot with fat layer exposed: Secondary | ICD-10-CM | POA: Diagnosis not present

## 2019-10-02 DIAGNOSIS — I10 Essential (primary) hypertension: Secondary | ICD-10-CM | POA: Diagnosis not present

## 2019-10-02 DIAGNOSIS — L97522 Non-pressure chronic ulcer of other part of left foot with fat layer exposed: Secondary | ICD-10-CM | POA: Diagnosis not present

## 2019-10-02 DIAGNOSIS — L97412 Non-pressure chronic ulcer of right heel and midfoot with fat layer exposed: Secondary | ICD-10-CM | POA: Diagnosis not present

## 2019-10-02 DIAGNOSIS — L97512 Non-pressure chronic ulcer of other part of right foot with fat layer exposed: Secondary | ICD-10-CM | POA: Diagnosis not present

## 2019-10-02 DIAGNOSIS — T8189XA Other complications of procedures, not elsewhere classified, initial encounter: Secondary | ICD-10-CM | POA: Diagnosis not present

## 2019-10-02 DIAGNOSIS — E11621 Type 2 diabetes mellitus with foot ulcer: Secondary | ICD-10-CM | POA: Diagnosis not present

## 2019-10-05 DIAGNOSIS — E7800 Pure hypercholesterolemia, unspecified: Secondary | ICD-10-CM | POA: Diagnosis not present

## 2019-10-05 DIAGNOSIS — K219 Gastro-esophageal reflux disease without esophagitis: Secondary | ICD-10-CM | POA: Diagnosis not present

## 2019-10-05 DIAGNOSIS — E612 Magnesium deficiency: Secondary | ICD-10-CM | POA: Diagnosis not present

## 2019-10-05 DIAGNOSIS — E559 Vitamin D deficiency, unspecified: Secondary | ICD-10-CM | POA: Diagnosis not present

## 2019-10-05 DIAGNOSIS — E1142 Type 2 diabetes mellitus with diabetic polyneuropathy: Secondary | ICD-10-CM | POA: Diagnosis not present

## 2019-10-05 DIAGNOSIS — E11621 Type 2 diabetes mellitus with foot ulcer: Secondary | ICD-10-CM | POA: Diagnosis not present

## 2019-10-05 DIAGNOSIS — L97412 Non-pressure chronic ulcer of right heel and midfoot with fat layer exposed: Secondary | ICD-10-CM | POA: Diagnosis not present

## 2019-10-05 DIAGNOSIS — Z794 Long term (current) use of insulin: Secondary | ICD-10-CM | POA: Diagnosis not present

## 2019-10-05 DIAGNOSIS — Z9981 Dependence on supplemental oxygen: Secondary | ICD-10-CM | POA: Diagnosis not present

## 2019-10-05 DIAGNOSIS — Z79891 Long term (current) use of opiate analgesic: Secondary | ICD-10-CM | POA: Diagnosis not present

## 2019-10-05 DIAGNOSIS — E1165 Type 2 diabetes mellitus with hyperglycemia: Secondary | ICD-10-CM | POA: Diagnosis not present

## 2019-10-05 DIAGNOSIS — J449 Chronic obstructive pulmonary disease, unspecified: Secondary | ICD-10-CM | POA: Diagnosis not present

## 2019-10-05 DIAGNOSIS — D649 Anemia, unspecified: Secondary | ICD-10-CM | POA: Diagnosis not present

## 2019-10-05 DIAGNOSIS — I10 Essential (primary) hypertension: Secondary | ICD-10-CM | POA: Diagnosis not present

## 2019-10-05 DIAGNOSIS — Z9181 History of falling: Secondary | ICD-10-CM | POA: Diagnosis not present

## 2019-10-05 DIAGNOSIS — L97522 Non-pressure chronic ulcer of other part of left foot with fat layer exposed: Secondary | ICD-10-CM | POA: Diagnosis not present

## 2019-10-09 DIAGNOSIS — T8189XA Other complications of procedures, not elsewhere classified, initial encounter: Secondary | ICD-10-CM | POA: Diagnosis not present

## 2019-10-09 DIAGNOSIS — L97522 Non-pressure chronic ulcer of other part of left foot with fat layer exposed: Secondary | ICD-10-CM | POA: Diagnosis not present

## 2019-10-09 DIAGNOSIS — E11621 Type 2 diabetes mellitus with foot ulcer: Secondary | ICD-10-CM | POA: Diagnosis not present

## 2019-10-09 DIAGNOSIS — I10 Essential (primary) hypertension: Secondary | ICD-10-CM | POA: Diagnosis not present

## 2019-10-09 DIAGNOSIS — S91102A Unspecified open wound of left great toe without damage to nail, initial encounter: Secondary | ICD-10-CM | POA: Diagnosis not present

## 2019-10-09 DIAGNOSIS — S91301A Unspecified open wound, right foot, initial encounter: Secondary | ICD-10-CM | POA: Diagnosis not present

## 2019-10-09 DIAGNOSIS — L97412 Non-pressure chronic ulcer of right heel and midfoot with fat layer exposed: Secondary | ICD-10-CM | POA: Diagnosis not present

## 2019-10-11 DIAGNOSIS — L97412 Non-pressure chronic ulcer of right heel and midfoot with fat layer exposed: Secondary | ICD-10-CM | POA: Diagnosis not present

## 2019-10-11 DIAGNOSIS — Z9181 History of falling: Secondary | ICD-10-CM | POA: Diagnosis not present

## 2019-10-11 DIAGNOSIS — K219 Gastro-esophageal reflux disease without esophagitis: Secondary | ICD-10-CM | POA: Diagnosis not present

## 2019-10-11 DIAGNOSIS — E1165 Type 2 diabetes mellitus with hyperglycemia: Secondary | ICD-10-CM | POA: Diagnosis not present

## 2019-10-11 DIAGNOSIS — E7800 Pure hypercholesterolemia, unspecified: Secondary | ICD-10-CM | POA: Diagnosis not present

## 2019-10-11 DIAGNOSIS — I10 Essential (primary) hypertension: Secondary | ICD-10-CM | POA: Diagnosis not present

## 2019-10-11 DIAGNOSIS — L97522 Non-pressure chronic ulcer of other part of left foot with fat layer exposed: Secondary | ICD-10-CM | POA: Diagnosis not present

## 2019-10-11 DIAGNOSIS — Z79891 Long term (current) use of opiate analgesic: Secondary | ICD-10-CM | POA: Diagnosis not present

## 2019-10-11 DIAGNOSIS — J449 Chronic obstructive pulmonary disease, unspecified: Secondary | ICD-10-CM | POA: Diagnosis not present

## 2019-10-11 DIAGNOSIS — E559 Vitamin D deficiency, unspecified: Secondary | ICD-10-CM | POA: Diagnosis not present

## 2019-10-11 DIAGNOSIS — Z9981 Dependence on supplemental oxygen: Secondary | ICD-10-CM | POA: Diagnosis not present

## 2019-10-11 DIAGNOSIS — Z794 Long term (current) use of insulin: Secondary | ICD-10-CM | POA: Diagnosis not present

## 2019-10-11 DIAGNOSIS — E1142 Type 2 diabetes mellitus with diabetic polyneuropathy: Secondary | ICD-10-CM | POA: Diagnosis not present

## 2019-10-11 DIAGNOSIS — E11621 Type 2 diabetes mellitus with foot ulcer: Secondary | ICD-10-CM | POA: Diagnosis not present

## 2019-10-11 DIAGNOSIS — E612 Magnesium deficiency: Secondary | ICD-10-CM | POA: Diagnosis not present

## 2019-10-11 DIAGNOSIS — D649 Anemia, unspecified: Secondary | ICD-10-CM | POA: Diagnosis not present

## 2019-10-13 DIAGNOSIS — E11621 Type 2 diabetes mellitus with foot ulcer: Secondary | ICD-10-CM | POA: Diagnosis not present

## 2019-10-13 DIAGNOSIS — I10 Essential (primary) hypertension: Secondary | ICD-10-CM | POA: Diagnosis not present

## 2019-10-13 DIAGNOSIS — Z9181 History of falling: Secondary | ICD-10-CM | POA: Diagnosis not present

## 2019-10-13 DIAGNOSIS — E612 Magnesium deficiency: Secondary | ICD-10-CM | POA: Diagnosis not present

## 2019-10-13 DIAGNOSIS — E1142 Type 2 diabetes mellitus with diabetic polyneuropathy: Secondary | ICD-10-CM | POA: Diagnosis not present

## 2019-10-13 DIAGNOSIS — Z9981 Dependence on supplemental oxygen: Secondary | ICD-10-CM | POA: Diagnosis not present

## 2019-10-13 DIAGNOSIS — E7800 Pure hypercholesterolemia, unspecified: Secondary | ICD-10-CM | POA: Diagnosis not present

## 2019-10-13 DIAGNOSIS — Z79891 Long term (current) use of opiate analgesic: Secondary | ICD-10-CM | POA: Diagnosis not present

## 2019-10-13 DIAGNOSIS — L97412 Non-pressure chronic ulcer of right heel and midfoot with fat layer exposed: Secondary | ICD-10-CM | POA: Diagnosis not present

## 2019-10-13 DIAGNOSIS — L97522 Non-pressure chronic ulcer of other part of left foot with fat layer exposed: Secondary | ICD-10-CM | POA: Diagnosis not present

## 2019-10-13 DIAGNOSIS — E1165 Type 2 diabetes mellitus with hyperglycemia: Secondary | ICD-10-CM | POA: Diagnosis not present

## 2019-10-13 DIAGNOSIS — Z794 Long term (current) use of insulin: Secondary | ICD-10-CM | POA: Diagnosis not present

## 2019-10-13 DIAGNOSIS — D649 Anemia, unspecified: Secondary | ICD-10-CM | POA: Diagnosis not present

## 2019-10-13 DIAGNOSIS — J449 Chronic obstructive pulmonary disease, unspecified: Secondary | ICD-10-CM | POA: Diagnosis not present

## 2019-10-13 DIAGNOSIS — K219 Gastro-esophageal reflux disease without esophagitis: Secondary | ICD-10-CM | POA: Diagnosis not present

## 2019-10-13 DIAGNOSIS — E559 Vitamin D deficiency, unspecified: Secondary | ICD-10-CM | POA: Diagnosis not present

## 2019-10-18 DIAGNOSIS — E559 Vitamin D deficiency, unspecified: Secondary | ICD-10-CM | POA: Diagnosis not present

## 2019-10-18 DIAGNOSIS — E612 Magnesium deficiency: Secondary | ICD-10-CM | POA: Diagnosis not present

## 2019-10-18 DIAGNOSIS — E11621 Type 2 diabetes mellitus with foot ulcer: Secondary | ICD-10-CM | POA: Diagnosis not present

## 2019-10-18 DIAGNOSIS — Z794 Long term (current) use of insulin: Secondary | ICD-10-CM | POA: Diagnosis not present

## 2019-10-18 DIAGNOSIS — E7800 Pure hypercholesterolemia, unspecified: Secondary | ICD-10-CM | POA: Diagnosis not present

## 2019-10-18 DIAGNOSIS — L97412 Non-pressure chronic ulcer of right heel and midfoot with fat layer exposed: Secondary | ICD-10-CM | POA: Diagnosis not present

## 2019-10-18 DIAGNOSIS — J449 Chronic obstructive pulmonary disease, unspecified: Secondary | ICD-10-CM | POA: Diagnosis not present

## 2019-10-18 DIAGNOSIS — D649 Anemia, unspecified: Secondary | ICD-10-CM | POA: Diagnosis not present

## 2019-10-18 DIAGNOSIS — Z9981 Dependence on supplemental oxygen: Secondary | ICD-10-CM | POA: Diagnosis not present

## 2019-10-18 DIAGNOSIS — L97522 Non-pressure chronic ulcer of other part of left foot with fat layer exposed: Secondary | ICD-10-CM | POA: Diagnosis not present

## 2019-10-18 DIAGNOSIS — E1165 Type 2 diabetes mellitus with hyperglycemia: Secondary | ICD-10-CM | POA: Diagnosis not present

## 2019-10-18 DIAGNOSIS — I10 Essential (primary) hypertension: Secondary | ICD-10-CM | POA: Diagnosis not present

## 2019-10-18 DIAGNOSIS — E1142 Type 2 diabetes mellitus with diabetic polyneuropathy: Secondary | ICD-10-CM | POA: Diagnosis not present

## 2019-10-18 DIAGNOSIS — Z79891 Long term (current) use of opiate analgesic: Secondary | ICD-10-CM | POA: Diagnosis not present

## 2019-10-18 DIAGNOSIS — Z9181 History of falling: Secondary | ICD-10-CM | POA: Diagnosis not present

## 2019-10-18 DIAGNOSIS — K219 Gastro-esophageal reflux disease without esophagitis: Secondary | ICD-10-CM | POA: Diagnosis not present

## 2019-10-20 DIAGNOSIS — Z9181 History of falling: Secondary | ICD-10-CM | POA: Diagnosis not present

## 2019-10-20 DIAGNOSIS — D649 Anemia, unspecified: Secondary | ICD-10-CM | POA: Diagnosis not present

## 2019-10-20 DIAGNOSIS — E7800 Pure hypercholesterolemia, unspecified: Secondary | ICD-10-CM | POA: Diagnosis not present

## 2019-10-20 DIAGNOSIS — I10 Essential (primary) hypertension: Secondary | ICD-10-CM | POA: Diagnosis not present

## 2019-10-20 DIAGNOSIS — E1142 Type 2 diabetes mellitus with diabetic polyneuropathy: Secondary | ICD-10-CM | POA: Diagnosis not present

## 2019-10-20 DIAGNOSIS — L97412 Non-pressure chronic ulcer of right heel and midfoot with fat layer exposed: Secondary | ICD-10-CM | POA: Diagnosis not present

## 2019-10-20 DIAGNOSIS — E612 Magnesium deficiency: Secondary | ICD-10-CM | POA: Diagnosis not present

## 2019-10-20 DIAGNOSIS — Z79891 Long term (current) use of opiate analgesic: Secondary | ICD-10-CM | POA: Diagnosis not present

## 2019-10-20 DIAGNOSIS — L97522 Non-pressure chronic ulcer of other part of left foot with fat layer exposed: Secondary | ICD-10-CM | POA: Diagnosis not present

## 2019-10-20 DIAGNOSIS — J449 Chronic obstructive pulmonary disease, unspecified: Secondary | ICD-10-CM | POA: Diagnosis not present

## 2019-10-20 DIAGNOSIS — E1165 Type 2 diabetes mellitus with hyperglycemia: Secondary | ICD-10-CM | POA: Diagnosis not present

## 2019-10-20 DIAGNOSIS — E559 Vitamin D deficiency, unspecified: Secondary | ICD-10-CM | POA: Diagnosis not present

## 2019-10-20 DIAGNOSIS — E11621 Type 2 diabetes mellitus with foot ulcer: Secondary | ICD-10-CM | POA: Diagnosis not present

## 2019-10-20 DIAGNOSIS — K219 Gastro-esophageal reflux disease without esophagitis: Secondary | ICD-10-CM | POA: Diagnosis not present

## 2019-10-20 DIAGNOSIS — Z9981 Dependence on supplemental oxygen: Secondary | ICD-10-CM | POA: Diagnosis not present

## 2019-10-20 DIAGNOSIS — Z794 Long term (current) use of insulin: Secondary | ICD-10-CM | POA: Diagnosis not present

## 2019-10-23 DIAGNOSIS — T8189XA Other complications of procedures, not elsewhere classified, initial encounter: Secondary | ICD-10-CM | POA: Diagnosis not present

## 2019-10-23 DIAGNOSIS — J449 Chronic obstructive pulmonary disease, unspecified: Secondary | ICD-10-CM | POA: Diagnosis not present

## 2019-10-23 DIAGNOSIS — I10 Essential (primary) hypertension: Secondary | ICD-10-CM | POA: Diagnosis not present

## 2019-10-23 DIAGNOSIS — L97522 Non-pressure chronic ulcer of other part of left foot with fat layer exposed: Secondary | ICD-10-CM | POA: Diagnosis not present

## 2019-10-23 DIAGNOSIS — L97412 Non-pressure chronic ulcer of right heel and midfoot with fat layer exposed: Secondary | ICD-10-CM | POA: Diagnosis not present

## 2019-10-23 DIAGNOSIS — E11621 Type 2 diabetes mellitus with foot ulcer: Secondary | ICD-10-CM | POA: Diagnosis not present

## 2019-10-25 DIAGNOSIS — E7800 Pure hypercholesterolemia, unspecified: Secondary | ICD-10-CM | POA: Diagnosis not present

## 2019-10-25 DIAGNOSIS — L97412 Non-pressure chronic ulcer of right heel and midfoot with fat layer exposed: Secondary | ICD-10-CM | POA: Diagnosis not present

## 2019-10-25 DIAGNOSIS — E612 Magnesium deficiency: Secondary | ICD-10-CM | POA: Diagnosis not present

## 2019-10-25 DIAGNOSIS — Z79891 Long term (current) use of opiate analgesic: Secondary | ICD-10-CM | POA: Diagnosis not present

## 2019-10-25 DIAGNOSIS — E1142 Type 2 diabetes mellitus with diabetic polyneuropathy: Secondary | ICD-10-CM | POA: Diagnosis not present

## 2019-10-25 DIAGNOSIS — J449 Chronic obstructive pulmonary disease, unspecified: Secondary | ICD-10-CM | POA: Diagnosis not present

## 2019-10-25 DIAGNOSIS — Z9181 History of falling: Secondary | ICD-10-CM | POA: Diagnosis not present

## 2019-10-25 DIAGNOSIS — K219 Gastro-esophageal reflux disease without esophagitis: Secondary | ICD-10-CM | POA: Diagnosis not present

## 2019-10-25 DIAGNOSIS — E559 Vitamin D deficiency, unspecified: Secondary | ICD-10-CM | POA: Diagnosis not present

## 2019-10-25 DIAGNOSIS — Z9981 Dependence on supplemental oxygen: Secondary | ICD-10-CM | POA: Diagnosis not present

## 2019-10-25 DIAGNOSIS — D649 Anemia, unspecified: Secondary | ICD-10-CM | POA: Diagnosis not present

## 2019-10-25 DIAGNOSIS — I10 Essential (primary) hypertension: Secondary | ICD-10-CM | POA: Diagnosis not present

## 2019-10-25 DIAGNOSIS — L97522 Non-pressure chronic ulcer of other part of left foot with fat layer exposed: Secondary | ICD-10-CM | POA: Diagnosis not present

## 2019-10-25 DIAGNOSIS — E11621 Type 2 diabetes mellitus with foot ulcer: Secondary | ICD-10-CM | POA: Diagnosis not present

## 2019-10-25 DIAGNOSIS — Z794 Long term (current) use of insulin: Secondary | ICD-10-CM | POA: Diagnosis not present

## 2019-10-25 DIAGNOSIS — E1165 Type 2 diabetes mellitus with hyperglycemia: Secondary | ICD-10-CM | POA: Diagnosis not present

## 2019-10-26 DIAGNOSIS — Z1212 Encounter for screening for malignant neoplasm of rectum: Secondary | ICD-10-CM | POA: Diagnosis not present

## 2019-10-26 DIAGNOSIS — Z1211 Encounter for screening for malignant neoplasm of colon: Secondary | ICD-10-CM | POA: Diagnosis not present

## 2019-10-27 DIAGNOSIS — E1165 Type 2 diabetes mellitus with hyperglycemia: Secondary | ICD-10-CM | POA: Diagnosis not present

## 2019-10-27 DIAGNOSIS — E11621 Type 2 diabetes mellitus with foot ulcer: Secondary | ICD-10-CM | POA: Diagnosis not present

## 2019-10-27 DIAGNOSIS — L97522 Non-pressure chronic ulcer of other part of left foot with fat layer exposed: Secondary | ICD-10-CM | POA: Diagnosis not present

## 2019-10-27 DIAGNOSIS — Z79891 Long term (current) use of opiate analgesic: Secondary | ICD-10-CM | POA: Diagnosis not present

## 2019-10-27 DIAGNOSIS — I10 Essential (primary) hypertension: Secondary | ICD-10-CM | POA: Diagnosis not present

## 2019-10-27 DIAGNOSIS — E559 Vitamin D deficiency, unspecified: Secondary | ICD-10-CM | POA: Diagnosis not present

## 2019-10-27 DIAGNOSIS — E7800 Pure hypercholesterolemia, unspecified: Secondary | ICD-10-CM | POA: Diagnosis not present

## 2019-10-27 DIAGNOSIS — K219 Gastro-esophageal reflux disease without esophagitis: Secondary | ICD-10-CM | POA: Diagnosis not present

## 2019-10-27 DIAGNOSIS — E612 Magnesium deficiency: Secondary | ICD-10-CM | POA: Diagnosis not present

## 2019-10-27 DIAGNOSIS — Z9181 History of falling: Secondary | ICD-10-CM | POA: Diagnosis not present

## 2019-10-27 DIAGNOSIS — D649 Anemia, unspecified: Secondary | ICD-10-CM | POA: Diagnosis not present

## 2019-10-27 DIAGNOSIS — J449 Chronic obstructive pulmonary disease, unspecified: Secondary | ICD-10-CM | POA: Diagnosis not present

## 2019-10-27 DIAGNOSIS — L97412 Non-pressure chronic ulcer of right heel and midfoot with fat layer exposed: Secondary | ICD-10-CM | POA: Diagnosis not present

## 2019-10-27 DIAGNOSIS — Z9981 Dependence on supplemental oxygen: Secondary | ICD-10-CM | POA: Diagnosis not present

## 2019-10-27 DIAGNOSIS — E1142 Type 2 diabetes mellitus with diabetic polyneuropathy: Secondary | ICD-10-CM | POA: Diagnosis not present

## 2019-10-27 DIAGNOSIS — Z794 Long term (current) use of insulin: Secondary | ICD-10-CM | POA: Diagnosis not present

## 2019-10-30 DIAGNOSIS — L97522 Non-pressure chronic ulcer of other part of left foot with fat layer exposed: Secondary | ICD-10-CM | POA: Diagnosis not present

## 2019-10-30 DIAGNOSIS — E11621 Type 2 diabetes mellitus with foot ulcer: Secondary | ICD-10-CM | POA: Diagnosis not present

## 2019-10-30 DIAGNOSIS — T8189XA Other complications of procedures, not elsewhere classified, initial encounter: Secondary | ICD-10-CM | POA: Diagnosis not present

## 2019-10-30 DIAGNOSIS — L97412 Non-pressure chronic ulcer of right heel and midfoot with fat layer exposed: Secondary | ICD-10-CM | POA: Diagnosis not present

## 2019-10-30 DIAGNOSIS — I10 Essential (primary) hypertension: Secondary | ICD-10-CM | POA: Diagnosis not present

## 2019-11-01 DIAGNOSIS — L97412 Non-pressure chronic ulcer of right heel and midfoot with fat layer exposed: Secondary | ICD-10-CM | POA: Diagnosis not present

## 2019-11-01 DIAGNOSIS — E559 Vitamin D deficiency, unspecified: Secondary | ICD-10-CM | POA: Diagnosis not present

## 2019-11-01 DIAGNOSIS — E1142 Type 2 diabetes mellitus with diabetic polyneuropathy: Secondary | ICD-10-CM | POA: Diagnosis not present

## 2019-11-01 DIAGNOSIS — L97522 Non-pressure chronic ulcer of other part of left foot with fat layer exposed: Secondary | ICD-10-CM | POA: Diagnosis not present

## 2019-11-01 DIAGNOSIS — Z9981 Dependence on supplemental oxygen: Secondary | ICD-10-CM | POA: Diagnosis not present

## 2019-11-01 DIAGNOSIS — E1165 Type 2 diabetes mellitus with hyperglycemia: Secondary | ICD-10-CM | POA: Diagnosis not present

## 2019-11-01 DIAGNOSIS — K219 Gastro-esophageal reflux disease without esophagitis: Secondary | ICD-10-CM | POA: Diagnosis not present

## 2019-11-01 DIAGNOSIS — E11621 Type 2 diabetes mellitus with foot ulcer: Secondary | ICD-10-CM | POA: Diagnosis not present

## 2019-11-01 DIAGNOSIS — Z794 Long term (current) use of insulin: Secondary | ICD-10-CM | POA: Diagnosis not present

## 2019-11-01 DIAGNOSIS — J449 Chronic obstructive pulmonary disease, unspecified: Secondary | ICD-10-CM | POA: Diagnosis not present

## 2019-11-01 DIAGNOSIS — Z79891 Long term (current) use of opiate analgesic: Secondary | ICD-10-CM | POA: Diagnosis not present

## 2019-11-01 DIAGNOSIS — E7800 Pure hypercholesterolemia, unspecified: Secondary | ICD-10-CM | POA: Diagnosis not present

## 2019-11-01 DIAGNOSIS — Z9181 History of falling: Secondary | ICD-10-CM | POA: Diagnosis not present

## 2019-11-01 DIAGNOSIS — E612 Magnesium deficiency: Secondary | ICD-10-CM | POA: Diagnosis not present

## 2019-11-01 DIAGNOSIS — D649 Anemia, unspecified: Secondary | ICD-10-CM | POA: Diagnosis not present

## 2019-11-01 DIAGNOSIS — I10 Essential (primary) hypertension: Secondary | ICD-10-CM | POA: Diagnosis not present

## 2019-11-01 LAB — COLOGUARD: COLOGUARD: NEGATIVE

## 2019-11-03 DIAGNOSIS — E1165 Type 2 diabetes mellitus with hyperglycemia: Secondary | ICD-10-CM | POA: Diagnosis not present

## 2019-11-03 DIAGNOSIS — E1142 Type 2 diabetes mellitus with diabetic polyneuropathy: Secondary | ICD-10-CM | POA: Diagnosis not present

## 2019-11-03 DIAGNOSIS — Z9181 History of falling: Secondary | ICD-10-CM | POA: Diagnosis not present

## 2019-11-03 DIAGNOSIS — E559 Vitamin D deficiency, unspecified: Secondary | ICD-10-CM | POA: Diagnosis not present

## 2019-11-03 DIAGNOSIS — E7800 Pure hypercholesterolemia, unspecified: Secondary | ICD-10-CM | POA: Diagnosis not present

## 2019-11-03 DIAGNOSIS — E612 Magnesium deficiency: Secondary | ICD-10-CM | POA: Diagnosis not present

## 2019-11-03 DIAGNOSIS — L97522 Non-pressure chronic ulcer of other part of left foot with fat layer exposed: Secondary | ICD-10-CM | POA: Diagnosis not present

## 2019-11-03 DIAGNOSIS — M47816 Spondylosis without myelopathy or radiculopathy, lumbar region: Secondary | ICD-10-CM | POA: Diagnosis not present

## 2019-11-03 DIAGNOSIS — J449 Chronic obstructive pulmonary disease, unspecified: Secondary | ICD-10-CM | POA: Diagnosis not present

## 2019-11-03 DIAGNOSIS — L97412 Non-pressure chronic ulcer of right heel and midfoot with fat layer exposed: Secondary | ICD-10-CM | POA: Diagnosis not present

## 2019-11-03 DIAGNOSIS — D649 Anemia, unspecified: Secondary | ICD-10-CM | POA: Diagnosis not present

## 2019-11-03 DIAGNOSIS — E11621 Type 2 diabetes mellitus with foot ulcer: Secondary | ICD-10-CM | POA: Diagnosis not present

## 2019-11-03 DIAGNOSIS — G894 Chronic pain syndrome: Secondary | ICD-10-CM | POA: Diagnosis not present

## 2019-11-03 DIAGNOSIS — Z794 Long term (current) use of insulin: Secondary | ICD-10-CM | POA: Diagnosis not present

## 2019-11-03 DIAGNOSIS — I10 Essential (primary) hypertension: Secondary | ICD-10-CM | POA: Diagnosis not present

## 2019-11-03 DIAGNOSIS — K219 Gastro-esophageal reflux disease without esophagitis: Secondary | ICD-10-CM | POA: Diagnosis not present

## 2019-11-03 DIAGNOSIS — Z9981 Dependence on supplemental oxygen: Secondary | ICD-10-CM | POA: Diagnosis not present

## 2019-11-03 DIAGNOSIS — Z79891 Long term (current) use of opiate analgesic: Secondary | ICD-10-CM | POA: Diagnosis not present

## 2019-11-07 DIAGNOSIS — L97412 Non-pressure chronic ulcer of right heel and midfoot with fat layer exposed: Secondary | ICD-10-CM | POA: Diagnosis not present

## 2019-11-07 DIAGNOSIS — S91102A Unspecified open wound of left great toe without damage to nail, initial encounter: Secondary | ICD-10-CM | POA: Diagnosis not present

## 2019-11-07 DIAGNOSIS — E11621 Type 2 diabetes mellitus with foot ulcer: Secondary | ICD-10-CM | POA: Diagnosis not present

## 2019-11-07 DIAGNOSIS — L97522 Non-pressure chronic ulcer of other part of left foot with fat layer exposed: Secondary | ICD-10-CM | POA: Diagnosis not present

## 2019-11-07 DIAGNOSIS — T8189XA Other complications of procedures, not elsewhere classified, initial encounter: Secondary | ICD-10-CM | POA: Diagnosis not present

## 2019-11-07 DIAGNOSIS — S91301A Unspecified open wound, right foot, initial encounter: Secondary | ICD-10-CM | POA: Diagnosis not present

## 2019-11-09 DIAGNOSIS — E7800 Pure hypercholesterolemia, unspecified: Secondary | ICD-10-CM | POA: Diagnosis not present

## 2019-11-09 DIAGNOSIS — Z9981 Dependence on supplemental oxygen: Secondary | ICD-10-CM | POA: Diagnosis not present

## 2019-11-09 DIAGNOSIS — E11621 Type 2 diabetes mellitus with foot ulcer: Secondary | ICD-10-CM | POA: Diagnosis not present

## 2019-11-09 DIAGNOSIS — E1142 Type 2 diabetes mellitus with diabetic polyneuropathy: Secondary | ICD-10-CM | POA: Diagnosis not present

## 2019-11-09 DIAGNOSIS — J449 Chronic obstructive pulmonary disease, unspecified: Secondary | ICD-10-CM | POA: Diagnosis not present

## 2019-11-09 DIAGNOSIS — K219 Gastro-esophageal reflux disease without esophagitis: Secondary | ICD-10-CM | POA: Diagnosis not present

## 2019-11-09 DIAGNOSIS — I10 Essential (primary) hypertension: Secondary | ICD-10-CM | POA: Diagnosis not present

## 2019-11-09 DIAGNOSIS — Z1231 Encounter for screening mammogram for malignant neoplasm of breast: Secondary | ICD-10-CM | POA: Diagnosis not present

## 2019-11-09 DIAGNOSIS — E559 Vitamin D deficiency, unspecified: Secondary | ICD-10-CM | POA: Diagnosis not present

## 2019-11-09 DIAGNOSIS — E1165 Type 2 diabetes mellitus with hyperglycemia: Secondary | ICD-10-CM | POA: Diagnosis not present

## 2019-11-09 DIAGNOSIS — E612 Magnesium deficiency: Secondary | ICD-10-CM | POA: Diagnosis not present

## 2019-11-09 DIAGNOSIS — Z9181 History of falling: Secondary | ICD-10-CM | POA: Diagnosis not present

## 2019-11-09 DIAGNOSIS — D649 Anemia, unspecified: Secondary | ICD-10-CM | POA: Diagnosis not present

## 2019-11-09 DIAGNOSIS — Z794 Long term (current) use of insulin: Secondary | ICD-10-CM | POA: Diagnosis not present

## 2019-11-09 DIAGNOSIS — Z79891 Long term (current) use of opiate analgesic: Secondary | ICD-10-CM | POA: Diagnosis not present

## 2019-11-09 DIAGNOSIS — L97522 Non-pressure chronic ulcer of other part of left foot with fat layer exposed: Secondary | ICD-10-CM | POA: Diagnosis not present

## 2019-11-09 DIAGNOSIS — L97412 Non-pressure chronic ulcer of right heel and midfoot with fat layer exposed: Secondary | ICD-10-CM | POA: Diagnosis not present

## 2019-11-13 DIAGNOSIS — R29898 Other symptoms and signs involving the musculoskeletal system: Secondary | ICD-10-CM | POA: Diagnosis not present

## 2019-11-14 DIAGNOSIS — L97412 Non-pressure chronic ulcer of right heel and midfoot with fat layer exposed: Secondary | ICD-10-CM | POA: Diagnosis not present

## 2019-11-14 DIAGNOSIS — L97522 Non-pressure chronic ulcer of other part of left foot with fat layer exposed: Secondary | ICD-10-CM | POA: Diagnosis not present

## 2019-11-14 DIAGNOSIS — Z79891 Long term (current) use of opiate analgesic: Secondary | ICD-10-CM | POA: Diagnosis not present

## 2019-11-14 DIAGNOSIS — E612 Magnesium deficiency: Secondary | ICD-10-CM | POA: Diagnosis not present

## 2019-11-14 DIAGNOSIS — K219 Gastro-esophageal reflux disease without esophagitis: Secondary | ICD-10-CM | POA: Diagnosis not present

## 2019-11-14 DIAGNOSIS — E7800 Pure hypercholesterolemia, unspecified: Secondary | ICD-10-CM | POA: Diagnosis not present

## 2019-11-14 DIAGNOSIS — I10 Essential (primary) hypertension: Secondary | ICD-10-CM | POA: Diagnosis not present

## 2019-11-14 DIAGNOSIS — E11621 Type 2 diabetes mellitus with foot ulcer: Secondary | ICD-10-CM | POA: Diagnosis not present

## 2019-11-14 DIAGNOSIS — Z9181 History of falling: Secondary | ICD-10-CM | POA: Diagnosis not present

## 2019-11-14 DIAGNOSIS — J449 Chronic obstructive pulmonary disease, unspecified: Secondary | ICD-10-CM | POA: Diagnosis not present

## 2019-11-14 DIAGNOSIS — E1165 Type 2 diabetes mellitus with hyperglycemia: Secondary | ICD-10-CM | POA: Diagnosis not present

## 2019-11-14 DIAGNOSIS — Z794 Long term (current) use of insulin: Secondary | ICD-10-CM | POA: Diagnosis not present

## 2019-11-14 DIAGNOSIS — E559 Vitamin D deficiency, unspecified: Secondary | ICD-10-CM | POA: Diagnosis not present

## 2019-11-14 DIAGNOSIS — D649 Anemia, unspecified: Secondary | ICD-10-CM | POA: Diagnosis not present

## 2019-11-14 DIAGNOSIS — Z9981 Dependence on supplemental oxygen: Secondary | ICD-10-CM | POA: Diagnosis not present

## 2019-11-14 DIAGNOSIS — E1142 Type 2 diabetes mellitus with diabetic polyneuropathy: Secondary | ICD-10-CM | POA: Diagnosis not present

## 2019-11-15 DIAGNOSIS — Z79891 Long term (current) use of opiate analgesic: Secondary | ICD-10-CM | POA: Diagnosis not present

## 2019-11-15 DIAGNOSIS — K219 Gastro-esophageal reflux disease without esophagitis: Secondary | ICD-10-CM | POA: Diagnosis not present

## 2019-11-15 DIAGNOSIS — E1165 Type 2 diabetes mellitus with hyperglycemia: Secondary | ICD-10-CM | POA: Diagnosis not present

## 2019-11-15 DIAGNOSIS — Z9981 Dependence on supplemental oxygen: Secondary | ICD-10-CM | POA: Diagnosis not present

## 2019-11-15 DIAGNOSIS — D649 Anemia, unspecified: Secondary | ICD-10-CM | POA: Diagnosis not present

## 2019-11-15 DIAGNOSIS — L97412 Non-pressure chronic ulcer of right heel and midfoot with fat layer exposed: Secondary | ICD-10-CM | POA: Diagnosis not present

## 2019-11-15 DIAGNOSIS — I10 Essential (primary) hypertension: Secondary | ICD-10-CM | POA: Diagnosis not present

## 2019-11-15 DIAGNOSIS — Z794 Long term (current) use of insulin: Secondary | ICD-10-CM | POA: Diagnosis not present

## 2019-11-15 DIAGNOSIS — Z9181 History of falling: Secondary | ICD-10-CM | POA: Diagnosis not present

## 2019-11-15 DIAGNOSIS — E11621 Type 2 diabetes mellitus with foot ulcer: Secondary | ICD-10-CM | POA: Diagnosis not present

## 2019-11-15 DIAGNOSIS — J449 Chronic obstructive pulmonary disease, unspecified: Secondary | ICD-10-CM | POA: Diagnosis not present

## 2019-11-15 DIAGNOSIS — E1142 Type 2 diabetes mellitus with diabetic polyneuropathy: Secondary | ICD-10-CM | POA: Diagnosis not present

## 2019-11-15 DIAGNOSIS — E612 Magnesium deficiency: Secondary | ICD-10-CM | POA: Diagnosis not present

## 2019-11-15 DIAGNOSIS — E7800 Pure hypercholesterolemia, unspecified: Secondary | ICD-10-CM | POA: Diagnosis not present

## 2019-11-15 DIAGNOSIS — L97522 Non-pressure chronic ulcer of other part of left foot with fat layer exposed: Secondary | ICD-10-CM | POA: Diagnosis not present

## 2019-11-15 DIAGNOSIS — E559 Vitamin D deficiency, unspecified: Secondary | ICD-10-CM | POA: Diagnosis not present

## 2019-11-16 DIAGNOSIS — E7800 Pure hypercholesterolemia, unspecified: Secondary | ICD-10-CM | POA: Diagnosis not present

## 2019-11-16 DIAGNOSIS — E11621 Type 2 diabetes mellitus with foot ulcer: Secondary | ICD-10-CM | POA: Diagnosis not present

## 2019-11-16 DIAGNOSIS — E1165 Type 2 diabetes mellitus with hyperglycemia: Secondary | ICD-10-CM | POA: Diagnosis not present

## 2019-11-16 DIAGNOSIS — D649 Anemia, unspecified: Secondary | ICD-10-CM | POA: Diagnosis not present

## 2019-11-16 DIAGNOSIS — Z9181 History of falling: Secondary | ICD-10-CM | POA: Diagnosis not present

## 2019-11-16 DIAGNOSIS — E612 Magnesium deficiency: Secondary | ICD-10-CM | POA: Diagnosis not present

## 2019-11-16 DIAGNOSIS — K219 Gastro-esophageal reflux disease without esophagitis: Secondary | ICD-10-CM | POA: Diagnosis not present

## 2019-11-16 DIAGNOSIS — L97412 Non-pressure chronic ulcer of right heel and midfoot with fat layer exposed: Secondary | ICD-10-CM | POA: Diagnosis not present

## 2019-11-16 DIAGNOSIS — Z79891 Long term (current) use of opiate analgesic: Secondary | ICD-10-CM | POA: Diagnosis not present

## 2019-11-16 DIAGNOSIS — E559 Vitamin D deficiency, unspecified: Secondary | ICD-10-CM | POA: Diagnosis not present

## 2019-11-16 DIAGNOSIS — Z794 Long term (current) use of insulin: Secondary | ICD-10-CM | POA: Diagnosis not present

## 2019-11-16 DIAGNOSIS — Z9981 Dependence on supplemental oxygen: Secondary | ICD-10-CM | POA: Diagnosis not present

## 2019-11-16 DIAGNOSIS — E1142 Type 2 diabetes mellitus with diabetic polyneuropathy: Secondary | ICD-10-CM | POA: Diagnosis not present

## 2019-11-16 DIAGNOSIS — L97522 Non-pressure chronic ulcer of other part of left foot with fat layer exposed: Secondary | ICD-10-CM | POA: Diagnosis not present

## 2019-11-16 DIAGNOSIS — J449 Chronic obstructive pulmonary disease, unspecified: Secondary | ICD-10-CM | POA: Diagnosis not present

## 2019-11-16 DIAGNOSIS — I10 Essential (primary) hypertension: Secondary | ICD-10-CM | POA: Diagnosis not present

## 2019-11-17 DIAGNOSIS — J449 Chronic obstructive pulmonary disease, unspecified: Secondary | ICD-10-CM | POA: Diagnosis not present

## 2019-11-17 DIAGNOSIS — D649 Anemia, unspecified: Secondary | ICD-10-CM | POA: Diagnosis not present

## 2019-11-17 DIAGNOSIS — E612 Magnesium deficiency: Secondary | ICD-10-CM | POA: Diagnosis not present

## 2019-11-17 DIAGNOSIS — E1165 Type 2 diabetes mellitus with hyperglycemia: Secondary | ICD-10-CM | POA: Diagnosis not present

## 2019-11-17 DIAGNOSIS — E1142 Type 2 diabetes mellitus with diabetic polyneuropathy: Secondary | ICD-10-CM | POA: Diagnosis not present

## 2019-11-17 DIAGNOSIS — E7800 Pure hypercholesterolemia, unspecified: Secondary | ICD-10-CM | POA: Diagnosis not present

## 2019-11-17 DIAGNOSIS — Z794 Long term (current) use of insulin: Secondary | ICD-10-CM | POA: Diagnosis not present

## 2019-11-17 DIAGNOSIS — Z9981 Dependence on supplemental oxygen: Secondary | ICD-10-CM | POA: Diagnosis not present

## 2019-11-17 DIAGNOSIS — L97412 Non-pressure chronic ulcer of right heel and midfoot with fat layer exposed: Secondary | ICD-10-CM | POA: Diagnosis not present

## 2019-11-17 DIAGNOSIS — K219 Gastro-esophageal reflux disease without esophagitis: Secondary | ICD-10-CM | POA: Diagnosis not present

## 2019-11-17 DIAGNOSIS — E559 Vitamin D deficiency, unspecified: Secondary | ICD-10-CM | POA: Diagnosis not present

## 2019-11-17 DIAGNOSIS — E11621 Type 2 diabetes mellitus with foot ulcer: Secondary | ICD-10-CM | POA: Diagnosis not present

## 2019-11-17 DIAGNOSIS — Z79891 Long term (current) use of opiate analgesic: Secondary | ICD-10-CM | POA: Diagnosis not present

## 2019-11-17 DIAGNOSIS — Z9181 History of falling: Secondary | ICD-10-CM | POA: Diagnosis not present

## 2019-11-17 DIAGNOSIS — I10 Essential (primary) hypertension: Secondary | ICD-10-CM | POA: Diagnosis not present

## 2019-11-17 DIAGNOSIS — L97522 Non-pressure chronic ulcer of other part of left foot with fat layer exposed: Secondary | ICD-10-CM | POA: Diagnosis not present

## 2019-11-20 ENCOUNTER — Other Ambulatory Visit: Payer: Self-pay

## 2019-11-20 DIAGNOSIS — Z9981 Dependence on supplemental oxygen: Secondary | ICD-10-CM | POA: Diagnosis not present

## 2019-11-20 DIAGNOSIS — E559 Vitamin D deficiency, unspecified: Secondary | ICD-10-CM | POA: Diagnosis not present

## 2019-11-20 DIAGNOSIS — E11621 Type 2 diabetes mellitus with foot ulcer: Secondary | ICD-10-CM | POA: Diagnosis not present

## 2019-11-20 DIAGNOSIS — Z794 Long term (current) use of insulin: Secondary | ICD-10-CM | POA: Diagnosis not present

## 2019-11-20 DIAGNOSIS — I10 Essential (primary) hypertension: Secondary | ICD-10-CM | POA: Diagnosis not present

## 2019-11-20 DIAGNOSIS — E1142 Type 2 diabetes mellitus with diabetic polyneuropathy: Secondary | ICD-10-CM | POA: Diagnosis not present

## 2019-11-20 DIAGNOSIS — E1165 Type 2 diabetes mellitus with hyperglycemia: Secondary | ICD-10-CM | POA: Diagnosis not present

## 2019-11-20 DIAGNOSIS — D649 Anemia, unspecified: Secondary | ICD-10-CM | POA: Diagnosis not present

## 2019-11-20 DIAGNOSIS — J449 Chronic obstructive pulmonary disease, unspecified: Secondary | ICD-10-CM | POA: Diagnosis not present

## 2019-11-20 DIAGNOSIS — L97522 Non-pressure chronic ulcer of other part of left foot with fat layer exposed: Secondary | ICD-10-CM | POA: Diagnosis not present

## 2019-11-20 DIAGNOSIS — Z9181 History of falling: Secondary | ICD-10-CM | POA: Diagnosis not present

## 2019-11-20 DIAGNOSIS — E7800 Pure hypercholesterolemia, unspecified: Secondary | ICD-10-CM | POA: Diagnosis not present

## 2019-11-20 DIAGNOSIS — E612 Magnesium deficiency: Secondary | ICD-10-CM | POA: Diagnosis not present

## 2019-11-20 DIAGNOSIS — Z79891 Long term (current) use of opiate analgesic: Secondary | ICD-10-CM | POA: Diagnosis not present

## 2019-11-20 DIAGNOSIS — L97412 Non-pressure chronic ulcer of right heel and midfoot with fat layer exposed: Secondary | ICD-10-CM | POA: Diagnosis not present

## 2019-11-20 DIAGNOSIS — T8189XA Other complications of procedures, not elsewhere classified, initial encounter: Secondary | ICD-10-CM | POA: Diagnosis not present

## 2019-11-20 DIAGNOSIS — K219 Gastro-esophageal reflux disease without esophagitis: Secondary | ICD-10-CM | POA: Diagnosis not present

## 2019-11-20 NOTE — Patient Outreach (Signed)
[facility]) Care Management  11/20/2019  LORREN ROSSETTI 1961-07-06 [id number]   Medication Adherence call to Mrs. Kahli Weyer Hippa Identifiers Verify spoke with patient she is past due on Atorvastatin 10 mg ,patient explain she takes 1/2 tablet daily,she explain pharmacy only gave her 3 tablets and ask her to come back for the rest,patent will be pick up soon. Mrs. Clover is showing past due under [facility] Ins.  Lillia Abed CPhT Pharmacy Technician [facility] Care Management Direct Dial [phone]  Fax [phone] [email or web address]

## 2019-11-22 DIAGNOSIS — L97412 Non-pressure chronic ulcer of right heel and midfoot with fat layer exposed: Secondary | ICD-10-CM | POA: Diagnosis not present

## 2019-11-22 DIAGNOSIS — E1165 Type 2 diabetes mellitus with hyperglycemia: Secondary | ICD-10-CM | POA: Diagnosis not present

## 2019-11-22 DIAGNOSIS — E559 Vitamin D deficiency, unspecified: Secondary | ICD-10-CM | POA: Diagnosis not present

## 2019-11-22 DIAGNOSIS — Z9981 Dependence on supplemental oxygen: Secondary | ICD-10-CM | POA: Diagnosis not present

## 2019-11-22 DIAGNOSIS — E612 Magnesium deficiency: Secondary | ICD-10-CM | POA: Diagnosis not present

## 2019-11-22 DIAGNOSIS — Z9181 History of falling: Secondary | ICD-10-CM | POA: Diagnosis not present

## 2019-11-22 DIAGNOSIS — E11621 Type 2 diabetes mellitus with foot ulcer: Secondary | ICD-10-CM | POA: Diagnosis not present

## 2019-11-22 DIAGNOSIS — I10 Essential (primary) hypertension: Secondary | ICD-10-CM | POA: Diagnosis not present

## 2019-11-22 DIAGNOSIS — E1142 Type 2 diabetes mellitus with diabetic polyneuropathy: Secondary | ICD-10-CM | POA: Diagnosis not present

## 2019-11-22 DIAGNOSIS — K219 Gastro-esophageal reflux disease without esophagitis: Secondary | ICD-10-CM | POA: Diagnosis not present

## 2019-11-22 DIAGNOSIS — Z794 Long term (current) use of insulin: Secondary | ICD-10-CM | POA: Diagnosis not present

## 2019-11-22 DIAGNOSIS — Z79891 Long term (current) use of opiate analgesic: Secondary | ICD-10-CM | POA: Diagnosis not present

## 2019-11-22 DIAGNOSIS — E7800 Pure hypercholesterolemia, unspecified: Secondary | ICD-10-CM | POA: Diagnosis not present

## 2019-11-22 DIAGNOSIS — L97522 Non-pressure chronic ulcer of other part of left foot with fat layer exposed: Secondary | ICD-10-CM | POA: Diagnosis not present

## 2019-11-22 DIAGNOSIS — D649 Anemia, unspecified: Secondary | ICD-10-CM | POA: Diagnosis not present

## 2019-11-22 DIAGNOSIS — J449 Chronic obstructive pulmonary disease, unspecified: Secondary | ICD-10-CM | POA: Diagnosis not present

## 2019-11-23 DIAGNOSIS — J449 Chronic obstructive pulmonary disease, unspecified: Secondary | ICD-10-CM | POA: Diagnosis not present

## 2019-11-24 DIAGNOSIS — L97412 Non-pressure chronic ulcer of right heel and midfoot with fat layer exposed: Secondary | ICD-10-CM | POA: Diagnosis not present

## 2019-11-24 DIAGNOSIS — L97522 Non-pressure chronic ulcer of other part of left foot with fat layer exposed: Secondary | ICD-10-CM | POA: Diagnosis not present

## 2019-11-24 DIAGNOSIS — Z794 Long term (current) use of insulin: Secondary | ICD-10-CM | POA: Diagnosis not present

## 2019-11-24 DIAGNOSIS — I10 Essential (primary) hypertension: Secondary | ICD-10-CM | POA: Diagnosis not present

## 2019-11-24 DIAGNOSIS — E612 Magnesium deficiency: Secondary | ICD-10-CM | POA: Diagnosis not present

## 2019-11-24 DIAGNOSIS — E7800 Pure hypercholesterolemia, unspecified: Secondary | ICD-10-CM | POA: Diagnosis not present

## 2019-11-24 DIAGNOSIS — Z9181 History of falling: Secondary | ICD-10-CM | POA: Diagnosis not present

## 2019-11-24 DIAGNOSIS — E1165 Type 2 diabetes mellitus with hyperglycemia: Secondary | ICD-10-CM | POA: Diagnosis not present

## 2019-11-24 DIAGNOSIS — E11621 Type 2 diabetes mellitus with foot ulcer: Secondary | ICD-10-CM | POA: Diagnosis not present

## 2019-11-24 DIAGNOSIS — E559 Vitamin D deficiency, unspecified: Secondary | ICD-10-CM | POA: Diagnosis not present

## 2019-11-24 DIAGNOSIS — Z9981 Dependence on supplemental oxygen: Secondary | ICD-10-CM | POA: Diagnosis not present

## 2019-11-24 DIAGNOSIS — D649 Anemia, unspecified: Secondary | ICD-10-CM | POA: Diagnosis not present

## 2019-11-24 DIAGNOSIS — Z79891 Long term (current) use of opiate analgesic: Secondary | ICD-10-CM | POA: Diagnosis not present

## 2019-11-24 DIAGNOSIS — E1142 Type 2 diabetes mellitus with diabetic polyneuropathy: Secondary | ICD-10-CM | POA: Diagnosis not present

## 2019-11-24 DIAGNOSIS — K219 Gastro-esophageal reflux disease without esophagitis: Secondary | ICD-10-CM | POA: Diagnosis not present

## 2019-11-24 DIAGNOSIS — J449 Chronic obstructive pulmonary disease, unspecified: Secondary | ICD-10-CM | POA: Diagnosis not present

## 2019-11-27 DIAGNOSIS — E11621 Type 2 diabetes mellitus with foot ulcer: Secondary | ICD-10-CM | POA: Diagnosis not present

## 2019-11-27 DIAGNOSIS — I10 Essential (primary) hypertension: Secondary | ICD-10-CM | POA: Diagnosis not present

## 2019-11-27 DIAGNOSIS — L97522 Non-pressure chronic ulcer of other part of left foot with fat layer exposed: Secondary | ICD-10-CM | POA: Diagnosis not present

## 2019-11-27 DIAGNOSIS — T8189XA Other complications of procedures, not elsewhere classified, initial encounter: Secondary | ICD-10-CM | POA: Diagnosis not present

## 2019-11-27 DIAGNOSIS — L97412 Non-pressure chronic ulcer of right heel and midfoot with fat layer exposed: Secondary | ICD-10-CM | POA: Diagnosis not present

## 2019-11-28 DIAGNOSIS — E7800 Pure hypercholesterolemia, unspecified: Secondary | ICD-10-CM | POA: Diagnosis not present

## 2019-11-28 DIAGNOSIS — Z9181 History of falling: Secondary | ICD-10-CM | POA: Diagnosis not present

## 2019-11-28 DIAGNOSIS — Z9981 Dependence on supplemental oxygen: Secondary | ICD-10-CM | POA: Diagnosis not present

## 2019-11-28 DIAGNOSIS — K219 Gastro-esophageal reflux disease without esophagitis: Secondary | ICD-10-CM | POA: Diagnosis not present

## 2019-11-28 DIAGNOSIS — E559 Vitamin D deficiency, unspecified: Secondary | ICD-10-CM | POA: Diagnosis not present

## 2019-11-28 DIAGNOSIS — E1165 Type 2 diabetes mellitus with hyperglycemia: Secondary | ICD-10-CM | POA: Diagnosis not present

## 2019-11-28 DIAGNOSIS — E1142 Type 2 diabetes mellitus with diabetic polyneuropathy: Secondary | ICD-10-CM | POA: Diagnosis not present

## 2019-11-28 DIAGNOSIS — L97412 Non-pressure chronic ulcer of right heel and midfoot with fat layer exposed: Secondary | ICD-10-CM | POA: Diagnosis not present

## 2019-11-28 DIAGNOSIS — Z79891 Long term (current) use of opiate analgesic: Secondary | ICD-10-CM | POA: Diagnosis not present

## 2019-11-28 DIAGNOSIS — I10 Essential (primary) hypertension: Secondary | ICD-10-CM | POA: Diagnosis not present

## 2019-11-28 DIAGNOSIS — E11621 Type 2 diabetes mellitus with foot ulcer: Secondary | ICD-10-CM | POA: Diagnosis not present

## 2019-11-28 DIAGNOSIS — L97522 Non-pressure chronic ulcer of other part of left foot with fat layer exposed: Secondary | ICD-10-CM | POA: Diagnosis not present

## 2019-11-28 DIAGNOSIS — D649 Anemia, unspecified: Secondary | ICD-10-CM | POA: Diagnosis not present

## 2019-11-28 DIAGNOSIS — E612 Magnesium deficiency: Secondary | ICD-10-CM | POA: Diagnosis not present

## 2019-11-28 DIAGNOSIS — J449 Chronic obstructive pulmonary disease, unspecified: Secondary | ICD-10-CM | POA: Diagnosis not present

## 2019-11-28 DIAGNOSIS — Z794 Long term (current) use of insulin: Secondary | ICD-10-CM | POA: Diagnosis not present

## 2019-11-29 DIAGNOSIS — L97522 Non-pressure chronic ulcer of other part of left foot with fat layer exposed: Secondary | ICD-10-CM | POA: Diagnosis not present

## 2019-11-29 DIAGNOSIS — E559 Vitamin D deficiency, unspecified: Secondary | ICD-10-CM | POA: Diagnosis not present

## 2019-11-29 DIAGNOSIS — Z9981 Dependence on supplemental oxygen: Secondary | ICD-10-CM | POA: Diagnosis not present

## 2019-11-29 DIAGNOSIS — E1165 Type 2 diabetes mellitus with hyperglycemia: Secondary | ICD-10-CM | POA: Diagnosis not present

## 2019-11-29 DIAGNOSIS — I10 Essential (primary) hypertension: Secondary | ICD-10-CM | POA: Diagnosis not present

## 2019-11-29 DIAGNOSIS — J449 Chronic obstructive pulmonary disease, unspecified: Secondary | ICD-10-CM | POA: Diagnosis not present

## 2019-11-29 DIAGNOSIS — K219 Gastro-esophageal reflux disease without esophagitis: Secondary | ICD-10-CM | POA: Diagnosis not present

## 2019-11-29 DIAGNOSIS — E7800 Pure hypercholesterolemia, unspecified: Secondary | ICD-10-CM | POA: Diagnosis not present

## 2019-11-29 DIAGNOSIS — D649 Anemia, unspecified: Secondary | ICD-10-CM | POA: Diagnosis not present

## 2019-11-29 DIAGNOSIS — Z79891 Long term (current) use of opiate analgesic: Secondary | ICD-10-CM | POA: Diagnosis not present

## 2019-11-29 DIAGNOSIS — E1142 Type 2 diabetes mellitus with diabetic polyneuropathy: Secondary | ICD-10-CM | POA: Diagnosis not present

## 2019-11-29 DIAGNOSIS — Z794 Long term (current) use of insulin: Secondary | ICD-10-CM | POA: Diagnosis not present

## 2019-11-29 DIAGNOSIS — E612 Magnesium deficiency: Secondary | ICD-10-CM | POA: Diagnosis not present

## 2019-11-29 DIAGNOSIS — Z9181 History of falling: Secondary | ICD-10-CM | POA: Diagnosis not present

## 2019-11-29 DIAGNOSIS — E11621 Type 2 diabetes mellitus with foot ulcer: Secondary | ICD-10-CM | POA: Diagnosis not present

## 2019-11-29 DIAGNOSIS — L97412 Non-pressure chronic ulcer of right heel and midfoot with fat layer exposed: Secondary | ICD-10-CM | POA: Diagnosis not present

## 2019-11-30 DIAGNOSIS — G894 Chronic pain syndrome: Secondary | ICD-10-CM | POA: Diagnosis not present

## 2019-11-30 DIAGNOSIS — M47816 Spondylosis without myelopathy or radiculopathy, lumbar region: Secondary | ICD-10-CM | POA: Diagnosis not present

## 2019-12-01 DIAGNOSIS — I10 Essential (primary) hypertension: Secondary | ICD-10-CM | POA: Diagnosis not present

## 2019-12-01 DIAGNOSIS — E7800 Pure hypercholesterolemia, unspecified: Secondary | ICD-10-CM | POA: Diagnosis not present

## 2019-12-01 DIAGNOSIS — Z9981 Dependence on supplemental oxygen: Secondary | ICD-10-CM | POA: Diagnosis not present

## 2019-12-01 DIAGNOSIS — E1142 Type 2 diabetes mellitus with diabetic polyneuropathy: Secondary | ICD-10-CM | POA: Diagnosis not present

## 2019-12-01 DIAGNOSIS — L97522 Non-pressure chronic ulcer of other part of left foot with fat layer exposed: Secondary | ICD-10-CM | POA: Diagnosis not present

## 2019-12-01 DIAGNOSIS — K219 Gastro-esophageal reflux disease without esophagitis: Secondary | ICD-10-CM | POA: Diagnosis not present

## 2019-12-01 DIAGNOSIS — E11621 Type 2 diabetes mellitus with foot ulcer: Secondary | ICD-10-CM | POA: Diagnosis not present

## 2019-12-01 DIAGNOSIS — L97412 Non-pressure chronic ulcer of right heel and midfoot with fat layer exposed: Secondary | ICD-10-CM | POA: Diagnosis not present

## 2019-12-01 DIAGNOSIS — Z79891 Long term (current) use of opiate analgesic: Secondary | ICD-10-CM | POA: Diagnosis not present

## 2019-12-01 DIAGNOSIS — E612 Magnesium deficiency: Secondary | ICD-10-CM | POA: Diagnosis not present

## 2019-12-01 DIAGNOSIS — J449 Chronic obstructive pulmonary disease, unspecified: Secondary | ICD-10-CM | POA: Diagnosis not present

## 2019-12-01 DIAGNOSIS — E559 Vitamin D deficiency, unspecified: Secondary | ICD-10-CM | POA: Diagnosis not present

## 2019-12-01 DIAGNOSIS — Z794 Long term (current) use of insulin: Secondary | ICD-10-CM | POA: Diagnosis not present

## 2019-12-01 DIAGNOSIS — E1165 Type 2 diabetes mellitus with hyperglycemia: Secondary | ICD-10-CM | POA: Diagnosis not present

## 2019-12-01 DIAGNOSIS — Z9181 History of falling: Secondary | ICD-10-CM | POA: Diagnosis not present

## 2019-12-01 DIAGNOSIS — D649 Anemia, unspecified: Secondary | ICD-10-CM | POA: Diagnosis not present

## 2019-12-04 DIAGNOSIS — I10 Essential (primary) hypertension: Secondary | ICD-10-CM | POA: Diagnosis not present

## 2019-12-04 DIAGNOSIS — E11621 Type 2 diabetes mellitus with foot ulcer: Secondary | ICD-10-CM | POA: Diagnosis not present

## 2019-12-04 DIAGNOSIS — T8189XA Other complications of procedures, not elsewhere classified, initial encounter: Secondary | ICD-10-CM | POA: Diagnosis not present

## 2019-12-04 DIAGNOSIS — L97522 Non-pressure chronic ulcer of other part of left foot with fat layer exposed: Secondary | ICD-10-CM | POA: Diagnosis not present

## 2019-12-04 DIAGNOSIS — L97412 Non-pressure chronic ulcer of right heel and midfoot with fat layer exposed: Secondary | ICD-10-CM | POA: Diagnosis not present

## 2019-12-06 DIAGNOSIS — L97412 Non-pressure chronic ulcer of right heel and midfoot with fat layer exposed: Secondary | ICD-10-CM | POA: Diagnosis not present

## 2019-12-06 DIAGNOSIS — L97522 Non-pressure chronic ulcer of other part of left foot with fat layer exposed: Secondary | ICD-10-CM | POA: Diagnosis not present

## 2019-12-06 DIAGNOSIS — J449 Chronic obstructive pulmonary disease, unspecified: Secondary | ICD-10-CM | POA: Diagnosis not present

## 2019-12-06 DIAGNOSIS — Z794 Long term (current) use of insulin: Secondary | ICD-10-CM | POA: Diagnosis not present

## 2019-12-06 DIAGNOSIS — E11621 Type 2 diabetes mellitus with foot ulcer: Secondary | ICD-10-CM | POA: Diagnosis not present

## 2019-12-06 DIAGNOSIS — E1165 Type 2 diabetes mellitus with hyperglycemia: Secondary | ICD-10-CM | POA: Diagnosis not present

## 2019-12-06 DIAGNOSIS — Z9981 Dependence on supplemental oxygen: Secondary | ICD-10-CM | POA: Diagnosis not present

## 2019-12-06 DIAGNOSIS — E7800 Pure hypercholesterolemia, unspecified: Secondary | ICD-10-CM | POA: Diagnosis not present

## 2019-12-06 DIAGNOSIS — E612 Magnesium deficiency: Secondary | ICD-10-CM | POA: Diagnosis not present

## 2019-12-06 DIAGNOSIS — Z79891 Long term (current) use of opiate analgesic: Secondary | ICD-10-CM | POA: Diagnosis not present

## 2019-12-06 DIAGNOSIS — Z9181 History of falling: Secondary | ICD-10-CM | POA: Diagnosis not present

## 2019-12-06 DIAGNOSIS — E1142 Type 2 diabetes mellitus with diabetic polyneuropathy: Secondary | ICD-10-CM | POA: Diagnosis not present

## 2019-12-06 DIAGNOSIS — I10 Essential (primary) hypertension: Secondary | ICD-10-CM | POA: Diagnosis not present

## 2019-12-06 DIAGNOSIS — K219 Gastro-esophageal reflux disease without esophagitis: Secondary | ICD-10-CM | POA: Diagnosis not present

## 2019-12-06 DIAGNOSIS — E559 Vitamin D deficiency, unspecified: Secondary | ICD-10-CM | POA: Diagnosis not present

## 2019-12-06 DIAGNOSIS — D649 Anemia, unspecified: Secondary | ICD-10-CM | POA: Diagnosis not present

## 2019-12-08 DIAGNOSIS — Z9981 Dependence on supplemental oxygen: Secondary | ICD-10-CM | POA: Diagnosis not present

## 2019-12-08 DIAGNOSIS — L97522 Non-pressure chronic ulcer of other part of left foot with fat layer exposed: Secondary | ICD-10-CM | POA: Diagnosis not present

## 2019-12-08 DIAGNOSIS — E11621 Type 2 diabetes mellitus with foot ulcer: Secondary | ICD-10-CM | POA: Diagnosis not present

## 2019-12-08 DIAGNOSIS — K219 Gastro-esophageal reflux disease without esophagitis: Secondary | ICD-10-CM | POA: Diagnosis not present

## 2019-12-08 DIAGNOSIS — E1165 Type 2 diabetes mellitus with hyperglycemia: Secondary | ICD-10-CM | POA: Diagnosis not present

## 2019-12-08 DIAGNOSIS — J449 Chronic obstructive pulmonary disease, unspecified: Secondary | ICD-10-CM | POA: Diagnosis not present

## 2019-12-08 DIAGNOSIS — Z79891 Long term (current) use of opiate analgesic: Secondary | ICD-10-CM | POA: Diagnosis not present

## 2019-12-08 DIAGNOSIS — D649 Anemia, unspecified: Secondary | ICD-10-CM | POA: Diagnosis not present

## 2019-12-08 DIAGNOSIS — I10 Essential (primary) hypertension: Secondary | ICD-10-CM | POA: Diagnosis not present

## 2019-12-08 DIAGNOSIS — E7800 Pure hypercholesterolemia, unspecified: Secondary | ICD-10-CM | POA: Diagnosis not present

## 2019-12-08 DIAGNOSIS — Z794 Long term (current) use of insulin: Secondary | ICD-10-CM | POA: Diagnosis not present

## 2019-12-08 DIAGNOSIS — E1142 Type 2 diabetes mellitus with diabetic polyneuropathy: Secondary | ICD-10-CM | POA: Diagnosis not present

## 2019-12-08 DIAGNOSIS — Z9181 History of falling: Secondary | ICD-10-CM | POA: Diagnosis not present

## 2019-12-08 DIAGNOSIS — E612 Magnesium deficiency: Secondary | ICD-10-CM | POA: Diagnosis not present

## 2019-12-08 DIAGNOSIS — E559 Vitamin D deficiency, unspecified: Secondary | ICD-10-CM | POA: Diagnosis not present

## 2019-12-08 DIAGNOSIS — L97412 Non-pressure chronic ulcer of right heel and midfoot with fat layer exposed: Secondary | ICD-10-CM | POA: Diagnosis not present

## 2019-12-11 DIAGNOSIS — R6889 Other general symptoms and signs: Secondary | ICD-10-CM | POA: Diagnosis not present

## 2019-12-11 DIAGNOSIS — L97412 Non-pressure chronic ulcer of right heel and midfoot with fat layer exposed: Secondary | ICD-10-CM | POA: Diagnosis not present

## 2019-12-11 DIAGNOSIS — T8189XA Other complications of procedures, not elsewhere classified, initial encounter: Secondary | ICD-10-CM | POA: Diagnosis not present

## 2019-12-11 DIAGNOSIS — I10 Essential (primary) hypertension: Secondary | ICD-10-CM | POA: Diagnosis not present

## 2019-12-11 DIAGNOSIS — Z79899 Other long term (current) drug therapy: Secondary | ICD-10-CM | POA: Diagnosis not present

## 2019-12-11 DIAGNOSIS — E11621 Type 2 diabetes mellitus with foot ulcer: Secondary | ICD-10-CM | POA: Diagnosis not present

## 2019-12-11 DIAGNOSIS — L97522 Non-pressure chronic ulcer of other part of left foot with fat layer exposed: Secondary | ICD-10-CM | POA: Diagnosis not present

## 2019-12-12 DIAGNOSIS — Z794 Long term (current) use of insulin: Secondary | ICD-10-CM | POA: Diagnosis not present

## 2019-12-12 DIAGNOSIS — L97522 Non-pressure chronic ulcer of other part of left foot with fat layer exposed: Secondary | ICD-10-CM | POA: Diagnosis not present

## 2019-12-12 DIAGNOSIS — E559 Vitamin D deficiency, unspecified: Secondary | ICD-10-CM | POA: Diagnosis not present

## 2019-12-12 DIAGNOSIS — L97412 Non-pressure chronic ulcer of right heel and midfoot with fat layer exposed: Secondary | ICD-10-CM | POA: Diagnosis not present

## 2019-12-12 DIAGNOSIS — Z9981 Dependence on supplemental oxygen: Secondary | ICD-10-CM | POA: Diagnosis not present

## 2019-12-12 DIAGNOSIS — E11621 Type 2 diabetes mellitus with foot ulcer: Secondary | ICD-10-CM | POA: Diagnosis not present

## 2019-12-12 DIAGNOSIS — J449 Chronic obstructive pulmonary disease, unspecified: Secondary | ICD-10-CM | POA: Diagnosis not present

## 2019-12-12 DIAGNOSIS — Z79891 Long term (current) use of opiate analgesic: Secondary | ICD-10-CM | POA: Diagnosis not present

## 2019-12-12 DIAGNOSIS — Z9181 History of falling: Secondary | ICD-10-CM | POA: Diagnosis not present

## 2019-12-12 DIAGNOSIS — E1165 Type 2 diabetes mellitus with hyperglycemia: Secondary | ICD-10-CM | POA: Diagnosis not present

## 2019-12-12 DIAGNOSIS — D649 Anemia, unspecified: Secondary | ICD-10-CM | POA: Diagnosis not present

## 2019-12-12 DIAGNOSIS — I10 Essential (primary) hypertension: Secondary | ICD-10-CM | POA: Diagnosis not present

## 2019-12-12 DIAGNOSIS — E612 Magnesium deficiency: Secondary | ICD-10-CM | POA: Diagnosis not present

## 2019-12-12 DIAGNOSIS — K219 Gastro-esophageal reflux disease without esophagitis: Secondary | ICD-10-CM | POA: Diagnosis not present

## 2019-12-12 DIAGNOSIS — E1142 Type 2 diabetes mellitus with diabetic polyneuropathy: Secondary | ICD-10-CM | POA: Diagnosis not present

## 2019-12-12 DIAGNOSIS — E7800 Pure hypercholesterolemia, unspecified: Secondary | ICD-10-CM | POA: Diagnosis not present

## 2019-12-13 DIAGNOSIS — L97522 Non-pressure chronic ulcer of other part of left foot with fat layer exposed: Secondary | ICD-10-CM | POA: Diagnosis not present

## 2019-12-13 DIAGNOSIS — J449 Chronic obstructive pulmonary disease, unspecified: Secondary | ICD-10-CM | POA: Diagnosis not present

## 2019-12-13 DIAGNOSIS — E559 Vitamin D deficiency, unspecified: Secondary | ICD-10-CM | POA: Diagnosis not present

## 2019-12-13 DIAGNOSIS — Z794 Long term (current) use of insulin: Secondary | ICD-10-CM | POA: Diagnosis not present

## 2019-12-13 DIAGNOSIS — D649 Anemia, unspecified: Secondary | ICD-10-CM | POA: Diagnosis not present

## 2019-12-13 DIAGNOSIS — K219 Gastro-esophageal reflux disease without esophagitis: Secondary | ICD-10-CM | POA: Diagnosis not present

## 2019-12-13 DIAGNOSIS — E1165 Type 2 diabetes mellitus with hyperglycemia: Secondary | ICD-10-CM | POA: Diagnosis not present

## 2019-12-13 DIAGNOSIS — I10 Essential (primary) hypertension: Secondary | ICD-10-CM | POA: Diagnosis not present

## 2019-12-13 DIAGNOSIS — Z79891 Long term (current) use of opiate analgesic: Secondary | ICD-10-CM | POA: Diagnosis not present

## 2019-12-13 DIAGNOSIS — Z9181 History of falling: Secondary | ICD-10-CM | POA: Diagnosis not present

## 2019-12-13 DIAGNOSIS — E7800 Pure hypercholesterolemia, unspecified: Secondary | ICD-10-CM | POA: Diagnosis not present

## 2019-12-13 DIAGNOSIS — L97412 Non-pressure chronic ulcer of right heel and midfoot with fat layer exposed: Secondary | ICD-10-CM | POA: Diagnosis not present

## 2019-12-13 DIAGNOSIS — E1142 Type 2 diabetes mellitus with diabetic polyneuropathy: Secondary | ICD-10-CM | POA: Diagnosis not present

## 2019-12-13 DIAGNOSIS — E11621 Type 2 diabetes mellitus with foot ulcer: Secondary | ICD-10-CM | POA: Diagnosis not present

## 2019-12-13 DIAGNOSIS — Z9981 Dependence on supplemental oxygen: Secondary | ICD-10-CM | POA: Diagnosis not present

## 2019-12-13 DIAGNOSIS — E612 Magnesium deficiency: Secondary | ICD-10-CM | POA: Diagnosis not present

## 2019-12-15 DIAGNOSIS — Z794 Long term (current) use of insulin: Secondary | ICD-10-CM | POA: Diagnosis not present

## 2019-12-15 DIAGNOSIS — E559 Vitamin D deficiency, unspecified: Secondary | ICD-10-CM | POA: Diagnosis not present

## 2019-12-15 DIAGNOSIS — Z79891 Long term (current) use of opiate analgesic: Secondary | ICD-10-CM | POA: Diagnosis not present

## 2019-12-15 DIAGNOSIS — J449 Chronic obstructive pulmonary disease, unspecified: Secondary | ICD-10-CM | POA: Diagnosis not present

## 2019-12-15 DIAGNOSIS — E1165 Type 2 diabetes mellitus with hyperglycemia: Secondary | ICD-10-CM | POA: Diagnosis not present

## 2019-12-15 DIAGNOSIS — K219 Gastro-esophageal reflux disease without esophagitis: Secondary | ICD-10-CM | POA: Diagnosis not present

## 2019-12-15 DIAGNOSIS — E7800 Pure hypercholesterolemia, unspecified: Secondary | ICD-10-CM | POA: Diagnosis not present

## 2019-12-15 DIAGNOSIS — L97522 Non-pressure chronic ulcer of other part of left foot with fat layer exposed: Secondary | ICD-10-CM | POA: Diagnosis not present

## 2019-12-15 DIAGNOSIS — E1142 Type 2 diabetes mellitus with diabetic polyneuropathy: Secondary | ICD-10-CM | POA: Diagnosis not present

## 2019-12-15 DIAGNOSIS — I10 Essential (primary) hypertension: Secondary | ICD-10-CM | POA: Diagnosis not present

## 2019-12-15 DIAGNOSIS — E11621 Type 2 diabetes mellitus with foot ulcer: Secondary | ICD-10-CM | POA: Diagnosis not present

## 2019-12-15 DIAGNOSIS — Z9181 History of falling: Secondary | ICD-10-CM | POA: Diagnosis not present

## 2019-12-15 DIAGNOSIS — Z9981 Dependence on supplemental oxygen: Secondary | ICD-10-CM | POA: Diagnosis not present

## 2019-12-15 DIAGNOSIS — L97412 Non-pressure chronic ulcer of right heel and midfoot with fat layer exposed: Secondary | ICD-10-CM | POA: Diagnosis not present

## 2019-12-15 DIAGNOSIS — D649 Anemia, unspecified: Secondary | ICD-10-CM | POA: Diagnosis not present

## 2019-12-15 DIAGNOSIS — E612 Magnesium deficiency: Secondary | ICD-10-CM | POA: Diagnosis not present

## 2019-12-20 DIAGNOSIS — D649 Anemia, unspecified: Secondary | ICD-10-CM | POA: Diagnosis not present

## 2019-12-20 DIAGNOSIS — E1142 Type 2 diabetes mellitus with diabetic polyneuropathy: Secondary | ICD-10-CM | POA: Diagnosis not present

## 2019-12-20 DIAGNOSIS — Z9981 Dependence on supplemental oxygen: Secondary | ICD-10-CM | POA: Diagnosis not present

## 2019-12-20 DIAGNOSIS — E11621 Type 2 diabetes mellitus with foot ulcer: Secondary | ICD-10-CM | POA: Diagnosis not present

## 2019-12-20 DIAGNOSIS — Z79891 Long term (current) use of opiate analgesic: Secondary | ICD-10-CM | POA: Diagnosis not present

## 2019-12-20 DIAGNOSIS — L97522 Non-pressure chronic ulcer of other part of left foot with fat layer exposed: Secondary | ICD-10-CM | POA: Diagnosis not present

## 2019-12-20 DIAGNOSIS — E1165 Type 2 diabetes mellitus with hyperglycemia: Secondary | ICD-10-CM | POA: Diagnosis not present

## 2019-12-20 DIAGNOSIS — Z794 Long term (current) use of insulin: Secondary | ICD-10-CM | POA: Diagnosis not present

## 2019-12-20 DIAGNOSIS — E559 Vitamin D deficiency, unspecified: Secondary | ICD-10-CM | POA: Diagnosis not present

## 2019-12-20 DIAGNOSIS — Z9181 History of falling: Secondary | ICD-10-CM | POA: Diagnosis not present

## 2019-12-20 DIAGNOSIS — I10 Essential (primary) hypertension: Secondary | ICD-10-CM | POA: Diagnosis not present

## 2019-12-20 DIAGNOSIS — E7800 Pure hypercholesterolemia, unspecified: Secondary | ICD-10-CM | POA: Diagnosis not present

## 2019-12-20 DIAGNOSIS — E612 Magnesium deficiency: Secondary | ICD-10-CM | POA: Diagnosis not present

## 2019-12-20 DIAGNOSIS — J449 Chronic obstructive pulmonary disease, unspecified: Secondary | ICD-10-CM | POA: Diagnosis not present

## 2019-12-20 DIAGNOSIS — K219 Gastro-esophageal reflux disease without esophagitis: Secondary | ICD-10-CM | POA: Diagnosis not present

## 2019-12-20 DIAGNOSIS — L97412 Non-pressure chronic ulcer of right heel and midfoot with fat layer exposed: Secondary | ICD-10-CM | POA: Diagnosis not present

## 2019-12-21 DIAGNOSIS — J449 Chronic obstructive pulmonary disease, unspecified: Secondary | ICD-10-CM | POA: Diagnosis not present

## 2019-12-22 DIAGNOSIS — L97522 Non-pressure chronic ulcer of other part of left foot with fat layer exposed: Secondary | ICD-10-CM | POA: Diagnosis not present

## 2019-12-22 DIAGNOSIS — Z9981 Dependence on supplemental oxygen: Secondary | ICD-10-CM | POA: Diagnosis not present

## 2019-12-22 DIAGNOSIS — Z794 Long term (current) use of insulin: Secondary | ICD-10-CM | POA: Diagnosis not present

## 2019-12-22 DIAGNOSIS — D649 Anemia, unspecified: Secondary | ICD-10-CM | POA: Diagnosis not present

## 2019-12-22 DIAGNOSIS — E559 Vitamin D deficiency, unspecified: Secondary | ICD-10-CM | POA: Diagnosis not present

## 2019-12-22 DIAGNOSIS — Z79891 Long term (current) use of opiate analgesic: Secondary | ICD-10-CM | POA: Diagnosis not present

## 2019-12-22 DIAGNOSIS — I10 Essential (primary) hypertension: Secondary | ICD-10-CM | POA: Diagnosis not present

## 2019-12-22 DIAGNOSIS — E1165 Type 2 diabetes mellitus with hyperglycemia: Secondary | ICD-10-CM | POA: Diagnosis not present

## 2019-12-22 DIAGNOSIS — E11621 Type 2 diabetes mellitus with foot ulcer: Secondary | ICD-10-CM | POA: Diagnosis not present

## 2019-12-22 DIAGNOSIS — Z9181 History of falling: Secondary | ICD-10-CM | POA: Diagnosis not present

## 2019-12-22 DIAGNOSIS — E7800 Pure hypercholesterolemia, unspecified: Secondary | ICD-10-CM | POA: Diagnosis not present

## 2019-12-22 DIAGNOSIS — J449 Chronic obstructive pulmonary disease, unspecified: Secondary | ICD-10-CM | POA: Diagnosis not present

## 2019-12-22 DIAGNOSIS — E1142 Type 2 diabetes mellitus with diabetic polyneuropathy: Secondary | ICD-10-CM | POA: Diagnosis not present

## 2019-12-22 DIAGNOSIS — E612 Magnesium deficiency: Secondary | ICD-10-CM | POA: Diagnosis not present

## 2019-12-22 DIAGNOSIS — L97412 Non-pressure chronic ulcer of right heel and midfoot with fat layer exposed: Secondary | ICD-10-CM | POA: Diagnosis not present

## 2019-12-22 DIAGNOSIS — K219 Gastro-esophageal reflux disease without esophagitis: Secondary | ICD-10-CM | POA: Diagnosis not present

## 2019-12-25 DIAGNOSIS — L97522 Non-pressure chronic ulcer of other part of left foot with fat layer exposed: Secondary | ICD-10-CM | POA: Diagnosis not present

## 2019-12-25 DIAGNOSIS — D649 Anemia, unspecified: Secondary | ICD-10-CM | POA: Diagnosis not present

## 2019-12-25 DIAGNOSIS — I10 Essential (primary) hypertension: Secondary | ICD-10-CM | POA: Diagnosis not present

## 2019-12-25 DIAGNOSIS — E11621 Type 2 diabetes mellitus with foot ulcer: Secondary | ICD-10-CM | POA: Diagnosis not present

## 2019-12-25 DIAGNOSIS — Z79891 Long term (current) use of opiate analgesic: Secondary | ICD-10-CM | POA: Diagnosis not present

## 2019-12-25 DIAGNOSIS — Z9981 Dependence on supplemental oxygen: Secondary | ICD-10-CM | POA: Diagnosis not present

## 2019-12-25 DIAGNOSIS — Z794 Long term (current) use of insulin: Secondary | ICD-10-CM | POA: Diagnosis not present

## 2019-12-25 DIAGNOSIS — E612 Magnesium deficiency: Secondary | ICD-10-CM | POA: Diagnosis not present

## 2019-12-25 DIAGNOSIS — E1165 Type 2 diabetes mellitus with hyperglycemia: Secondary | ICD-10-CM | POA: Diagnosis not present

## 2019-12-25 DIAGNOSIS — K219 Gastro-esophageal reflux disease without esophagitis: Secondary | ICD-10-CM | POA: Diagnosis not present

## 2019-12-25 DIAGNOSIS — E7800 Pure hypercholesterolemia, unspecified: Secondary | ICD-10-CM | POA: Diagnosis not present

## 2019-12-25 DIAGNOSIS — E1142 Type 2 diabetes mellitus with diabetic polyneuropathy: Secondary | ICD-10-CM | POA: Diagnosis not present

## 2019-12-25 DIAGNOSIS — J449 Chronic obstructive pulmonary disease, unspecified: Secondary | ICD-10-CM | POA: Diagnosis not present

## 2019-12-25 DIAGNOSIS — E559 Vitamin D deficiency, unspecified: Secondary | ICD-10-CM | POA: Diagnosis not present

## 2019-12-25 DIAGNOSIS — L97412 Non-pressure chronic ulcer of right heel and midfoot with fat layer exposed: Secondary | ICD-10-CM | POA: Diagnosis not present

## 2019-12-25 DIAGNOSIS — Z9181 History of falling: Secondary | ICD-10-CM | POA: Diagnosis not present

## 2019-12-27 DIAGNOSIS — E559 Vitamin D deficiency, unspecified: Secondary | ICD-10-CM | POA: Diagnosis not present

## 2019-12-27 DIAGNOSIS — D649 Anemia, unspecified: Secondary | ICD-10-CM | POA: Diagnosis not present

## 2019-12-27 DIAGNOSIS — L97412 Non-pressure chronic ulcer of right heel and midfoot with fat layer exposed: Secondary | ICD-10-CM | POA: Diagnosis not present

## 2019-12-27 DIAGNOSIS — K219 Gastro-esophageal reflux disease without esophagitis: Secondary | ICD-10-CM | POA: Diagnosis not present

## 2019-12-27 DIAGNOSIS — J449 Chronic obstructive pulmonary disease, unspecified: Secondary | ICD-10-CM | POA: Diagnosis not present

## 2019-12-27 DIAGNOSIS — E11621 Type 2 diabetes mellitus with foot ulcer: Secondary | ICD-10-CM | POA: Diagnosis not present

## 2019-12-27 DIAGNOSIS — Z9981 Dependence on supplemental oxygen: Secondary | ICD-10-CM | POA: Diagnosis not present

## 2019-12-27 DIAGNOSIS — Z794 Long term (current) use of insulin: Secondary | ICD-10-CM | POA: Diagnosis not present

## 2019-12-27 DIAGNOSIS — I10 Essential (primary) hypertension: Secondary | ICD-10-CM | POA: Diagnosis not present

## 2019-12-27 DIAGNOSIS — L97522 Non-pressure chronic ulcer of other part of left foot with fat layer exposed: Secondary | ICD-10-CM | POA: Diagnosis not present

## 2019-12-27 DIAGNOSIS — E1165 Type 2 diabetes mellitus with hyperglycemia: Secondary | ICD-10-CM | POA: Diagnosis not present

## 2019-12-27 DIAGNOSIS — E7800 Pure hypercholesterolemia, unspecified: Secondary | ICD-10-CM | POA: Diagnosis not present

## 2019-12-27 DIAGNOSIS — Z79891 Long term (current) use of opiate analgesic: Secondary | ICD-10-CM | POA: Diagnosis not present

## 2019-12-27 DIAGNOSIS — E612 Magnesium deficiency: Secondary | ICD-10-CM | POA: Diagnosis not present

## 2019-12-27 DIAGNOSIS — Z9181 History of falling: Secondary | ICD-10-CM | POA: Diagnosis not present

## 2019-12-27 DIAGNOSIS — E1142 Type 2 diabetes mellitus with diabetic polyneuropathy: Secondary | ICD-10-CM | POA: Diagnosis not present

## 2019-12-28 DIAGNOSIS — G894 Chronic pain syndrome: Secondary | ICD-10-CM | POA: Diagnosis not present

## 2019-12-28 DIAGNOSIS — M47816 Spondylosis without myelopathy or radiculopathy, lumbar region: Secondary | ICD-10-CM | POA: Diagnosis not present

## 2019-12-29 DIAGNOSIS — L97412 Non-pressure chronic ulcer of right heel and midfoot with fat layer exposed: Secondary | ICD-10-CM | POA: Diagnosis not present

## 2019-12-29 DIAGNOSIS — Z9981 Dependence on supplemental oxygen: Secondary | ICD-10-CM | POA: Diagnosis not present

## 2019-12-29 DIAGNOSIS — D649 Anemia, unspecified: Secondary | ICD-10-CM | POA: Diagnosis not present

## 2019-12-29 DIAGNOSIS — Z9181 History of falling: Secondary | ICD-10-CM | POA: Diagnosis not present

## 2019-12-29 DIAGNOSIS — E1142 Type 2 diabetes mellitus with diabetic polyneuropathy: Secondary | ICD-10-CM | POA: Diagnosis not present

## 2019-12-29 DIAGNOSIS — Z794 Long term (current) use of insulin: Secondary | ICD-10-CM | POA: Diagnosis not present

## 2019-12-29 DIAGNOSIS — E559 Vitamin D deficiency, unspecified: Secondary | ICD-10-CM | POA: Diagnosis not present

## 2019-12-29 DIAGNOSIS — Z79891 Long term (current) use of opiate analgesic: Secondary | ICD-10-CM | POA: Diagnosis not present

## 2019-12-29 DIAGNOSIS — J449 Chronic obstructive pulmonary disease, unspecified: Secondary | ICD-10-CM | POA: Diagnosis not present

## 2019-12-29 DIAGNOSIS — E7800 Pure hypercholesterolemia, unspecified: Secondary | ICD-10-CM | POA: Diagnosis not present

## 2019-12-29 DIAGNOSIS — I10 Essential (primary) hypertension: Secondary | ICD-10-CM | POA: Diagnosis not present

## 2019-12-29 DIAGNOSIS — K219 Gastro-esophageal reflux disease without esophagitis: Secondary | ICD-10-CM | POA: Diagnosis not present

## 2019-12-29 DIAGNOSIS — E1165 Type 2 diabetes mellitus with hyperglycemia: Secondary | ICD-10-CM | POA: Diagnosis not present

## 2019-12-29 DIAGNOSIS — E612 Magnesium deficiency: Secondary | ICD-10-CM | POA: Diagnosis not present

## 2019-12-29 DIAGNOSIS — L97522 Non-pressure chronic ulcer of other part of left foot with fat layer exposed: Secondary | ICD-10-CM | POA: Diagnosis not present

## 2019-12-29 DIAGNOSIS — E11621 Type 2 diabetes mellitus with foot ulcer: Secondary | ICD-10-CM | POA: Diagnosis not present

## 2020-01-01 DIAGNOSIS — E1140 Type 2 diabetes mellitus with diabetic neuropathy, unspecified: Secondary | ICD-10-CM | POA: Diagnosis not present

## 2020-01-01 DIAGNOSIS — Z79891 Long term (current) use of opiate analgesic: Secondary | ICD-10-CM | POA: Diagnosis not present

## 2020-01-01 DIAGNOSIS — E612 Magnesium deficiency: Secondary | ICD-10-CM | POA: Diagnosis not present

## 2020-01-01 DIAGNOSIS — Z794 Long term (current) use of insulin: Secondary | ICD-10-CM | POA: Diagnosis not present

## 2020-01-01 DIAGNOSIS — N289 Disorder of kidney and ureter, unspecified: Secondary | ICD-10-CM | POA: Diagnosis not present

## 2020-01-01 DIAGNOSIS — K219 Gastro-esophageal reflux disease without esophagitis: Secondary | ICD-10-CM | POA: Diagnosis not present

## 2020-01-01 DIAGNOSIS — L97412 Non-pressure chronic ulcer of right heel and midfoot with fat layer exposed: Secondary | ICD-10-CM | POA: Diagnosis not present

## 2020-01-01 DIAGNOSIS — E7800 Pure hypercholesterolemia, unspecified: Secondary | ICD-10-CM | POA: Diagnosis not present

## 2020-01-01 DIAGNOSIS — E1165 Type 2 diabetes mellitus with hyperglycemia: Secondary | ICD-10-CM | POA: Diagnosis not present

## 2020-01-01 DIAGNOSIS — J449 Chronic obstructive pulmonary disease, unspecified: Secondary | ICD-10-CM | POA: Diagnosis not present

## 2020-01-01 DIAGNOSIS — E559 Vitamin D deficiency, unspecified: Secondary | ICD-10-CM | POA: Diagnosis not present

## 2020-01-01 DIAGNOSIS — L97522 Non-pressure chronic ulcer of other part of left foot with fat layer exposed: Secondary | ICD-10-CM | POA: Diagnosis not present

## 2020-01-01 DIAGNOSIS — Z9181 History of falling: Secondary | ICD-10-CM | POA: Diagnosis not present

## 2020-01-01 DIAGNOSIS — D649 Anemia, unspecified: Secondary | ICD-10-CM | POA: Diagnosis not present

## 2020-01-01 DIAGNOSIS — I10 Essential (primary) hypertension: Secondary | ICD-10-CM | POA: Diagnosis not present

## 2020-01-01 DIAGNOSIS — E1142 Type 2 diabetes mellitus with diabetic polyneuropathy: Secondary | ICD-10-CM | POA: Diagnosis not present

## 2020-01-01 DIAGNOSIS — Z9981 Dependence on supplemental oxygen: Secondary | ICD-10-CM | POA: Diagnosis not present

## 2020-01-01 DIAGNOSIS — E11621 Type 2 diabetes mellitus with foot ulcer: Secondary | ICD-10-CM | POA: Diagnosis not present

## 2020-01-03 DIAGNOSIS — E612 Magnesium deficiency: Secondary | ICD-10-CM | POA: Diagnosis not present

## 2020-01-03 DIAGNOSIS — J449 Chronic obstructive pulmonary disease, unspecified: Secondary | ICD-10-CM | POA: Diagnosis not present

## 2020-01-03 DIAGNOSIS — E1165 Type 2 diabetes mellitus with hyperglycemia: Secondary | ICD-10-CM | POA: Diagnosis not present

## 2020-01-03 DIAGNOSIS — E1142 Type 2 diabetes mellitus with diabetic polyneuropathy: Secondary | ICD-10-CM | POA: Diagnosis not present

## 2020-01-03 DIAGNOSIS — Z794 Long term (current) use of insulin: Secondary | ICD-10-CM | POA: Diagnosis not present

## 2020-01-03 DIAGNOSIS — I10 Essential (primary) hypertension: Secondary | ICD-10-CM | POA: Diagnosis not present

## 2020-01-03 DIAGNOSIS — L97412 Non-pressure chronic ulcer of right heel and midfoot with fat layer exposed: Secondary | ICD-10-CM | POA: Diagnosis not present

## 2020-01-03 DIAGNOSIS — E7800 Pure hypercholesterolemia, unspecified: Secondary | ICD-10-CM | POA: Diagnosis not present

## 2020-01-03 DIAGNOSIS — E559 Vitamin D deficiency, unspecified: Secondary | ICD-10-CM | POA: Diagnosis not present

## 2020-01-03 DIAGNOSIS — Z9181 History of falling: Secondary | ICD-10-CM | POA: Diagnosis not present

## 2020-01-03 DIAGNOSIS — K219 Gastro-esophageal reflux disease without esophagitis: Secondary | ICD-10-CM | POA: Diagnosis not present

## 2020-01-03 DIAGNOSIS — Z9981 Dependence on supplemental oxygen: Secondary | ICD-10-CM | POA: Diagnosis not present

## 2020-01-03 DIAGNOSIS — E11621 Type 2 diabetes mellitus with foot ulcer: Secondary | ICD-10-CM | POA: Diagnosis not present

## 2020-01-03 DIAGNOSIS — Z79891 Long term (current) use of opiate analgesic: Secondary | ICD-10-CM | POA: Diagnosis not present

## 2020-01-03 DIAGNOSIS — D649 Anemia, unspecified: Secondary | ICD-10-CM | POA: Diagnosis not present

## 2020-01-03 DIAGNOSIS — L97522 Non-pressure chronic ulcer of other part of left foot with fat layer exposed: Secondary | ICD-10-CM | POA: Diagnosis not present

## 2020-01-05 DIAGNOSIS — L97522 Non-pressure chronic ulcer of other part of left foot with fat layer exposed: Secondary | ICD-10-CM | POA: Diagnosis not present

## 2020-01-05 DIAGNOSIS — I10 Essential (primary) hypertension: Secondary | ICD-10-CM | POA: Diagnosis not present

## 2020-01-05 DIAGNOSIS — E559 Vitamin D deficiency, unspecified: Secondary | ICD-10-CM | POA: Diagnosis not present

## 2020-01-05 DIAGNOSIS — Z79891 Long term (current) use of opiate analgesic: Secondary | ICD-10-CM | POA: Diagnosis not present

## 2020-01-05 DIAGNOSIS — E1165 Type 2 diabetes mellitus with hyperglycemia: Secondary | ICD-10-CM | POA: Diagnosis not present

## 2020-01-05 DIAGNOSIS — L97412 Non-pressure chronic ulcer of right heel and midfoot with fat layer exposed: Secondary | ICD-10-CM | POA: Diagnosis not present

## 2020-01-05 DIAGNOSIS — E11621 Type 2 diabetes mellitus with foot ulcer: Secondary | ICD-10-CM | POA: Diagnosis not present

## 2020-01-05 DIAGNOSIS — E1142 Type 2 diabetes mellitus with diabetic polyneuropathy: Secondary | ICD-10-CM | POA: Diagnosis not present

## 2020-01-05 DIAGNOSIS — E7800 Pure hypercholesterolemia, unspecified: Secondary | ICD-10-CM | POA: Diagnosis not present

## 2020-01-05 DIAGNOSIS — K219 Gastro-esophageal reflux disease without esophagitis: Secondary | ICD-10-CM | POA: Diagnosis not present

## 2020-01-05 DIAGNOSIS — Z9981 Dependence on supplemental oxygen: Secondary | ICD-10-CM | POA: Diagnosis not present

## 2020-01-05 DIAGNOSIS — J449 Chronic obstructive pulmonary disease, unspecified: Secondary | ICD-10-CM | POA: Diagnosis not present

## 2020-01-05 DIAGNOSIS — D649 Anemia, unspecified: Secondary | ICD-10-CM | POA: Diagnosis not present

## 2020-01-05 DIAGNOSIS — Z794 Long term (current) use of insulin: Secondary | ICD-10-CM | POA: Diagnosis not present

## 2020-01-05 DIAGNOSIS — Z9181 History of falling: Secondary | ICD-10-CM | POA: Diagnosis not present

## 2020-01-05 DIAGNOSIS — E612 Magnesium deficiency: Secondary | ICD-10-CM | POA: Diagnosis not present

## 2020-01-08 DIAGNOSIS — T8189XA Other complications of procedures, not elsewhere classified, initial encounter: Secondary | ICD-10-CM | POA: Diagnosis not present

## 2020-01-08 DIAGNOSIS — E11621 Type 2 diabetes mellitus with foot ulcer: Secondary | ICD-10-CM | POA: Diagnosis not present

## 2020-01-08 DIAGNOSIS — L97522 Non-pressure chronic ulcer of other part of left foot with fat layer exposed: Secondary | ICD-10-CM | POA: Diagnosis not present

## 2020-01-08 DIAGNOSIS — L97412 Non-pressure chronic ulcer of right heel and midfoot with fat layer exposed: Secondary | ICD-10-CM | POA: Diagnosis not present

## 2020-01-08 DIAGNOSIS — I10 Essential (primary) hypertension: Secondary | ICD-10-CM | POA: Diagnosis not present

## 2020-01-10 DIAGNOSIS — Z9181 History of falling: Secondary | ICD-10-CM | POA: Diagnosis not present

## 2020-01-10 DIAGNOSIS — E612 Magnesium deficiency: Secondary | ICD-10-CM | POA: Diagnosis not present

## 2020-01-10 DIAGNOSIS — E7800 Pure hypercholesterolemia, unspecified: Secondary | ICD-10-CM | POA: Diagnosis not present

## 2020-01-10 DIAGNOSIS — Z9981 Dependence on supplemental oxygen: Secondary | ICD-10-CM | POA: Diagnosis not present

## 2020-01-10 DIAGNOSIS — E1165 Type 2 diabetes mellitus with hyperglycemia: Secondary | ICD-10-CM | POA: Diagnosis not present

## 2020-01-10 DIAGNOSIS — L97522 Non-pressure chronic ulcer of other part of left foot with fat layer exposed: Secondary | ICD-10-CM | POA: Diagnosis not present

## 2020-01-10 DIAGNOSIS — Z79891 Long term (current) use of opiate analgesic: Secondary | ICD-10-CM | POA: Diagnosis not present

## 2020-01-10 DIAGNOSIS — Z794 Long term (current) use of insulin: Secondary | ICD-10-CM | POA: Diagnosis not present

## 2020-01-10 DIAGNOSIS — E559 Vitamin D deficiency, unspecified: Secondary | ICD-10-CM | POA: Diagnosis not present

## 2020-01-10 DIAGNOSIS — D649 Anemia, unspecified: Secondary | ICD-10-CM | POA: Diagnosis not present

## 2020-01-10 DIAGNOSIS — K219 Gastro-esophageal reflux disease without esophagitis: Secondary | ICD-10-CM | POA: Diagnosis not present

## 2020-01-10 DIAGNOSIS — L97412 Non-pressure chronic ulcer of right heel and midfoot with fat layer exposed: Secondary | ICD-10-CM | POA: Diagnosis not present

## 2020-01-10 DIAGNOSIS — E11621 Type 2 diabetes mellitus with foot ulcer: Secondary | ICD-10-CM | POA: Diagnosis not present

## 2020-01-10 DIAGNOSIS — J449 Chronic obstructive pulmonary disease, unspecified: Secondary | ICD-10-CM | POA: Diagnosis not present

## 2020-01-10 DIAGNOSIS — E1142 Type 2 diabetes mellitus with diabetic polyneuropathy: Secondary | ICD-10-CM | POA: Diagnosis not present

## 2020-01-10 DIAGNOSIS — I10 Essential (primary) hypertension: Secondary | ICD-10-CM | POA: Diagnosis not present

## 2020-01-12 DIAGNOSIS — L97412 Non-pressure chronic ulcer of right heel and midfoot with fat layer exposed: Secondary | ICD-10-CM | POA: Diagnosis not present

## 2020-01-12 DIAGNOSIS — E559 Vitamin D deficiency, unspecified: Secondary | ICD-10-CM | POA: Diagnosis not present

## 2020-01-12 DIAGNOSIS — E7800 Pure hypercholesterolemia, unspecified: Secondary | ICD-10-CM | POA: Diagnosis not present

## 2020-01-12 DIAGNOSIS — Z79891 Long term (current) use of opiate analgesic: Secondary | ICD-10-CM | POA: Diagnosis not present

## 2020-01-12 DIAGNOSIS — Z794 Long term (current) use of insulin: Secondary | ICD-10-CM | POA: Diagnosis not present

## 2020-01-12 DIAGNOSIS — E11621 Type 2 diabetes mellitus with foot ulcer: Secondary | ICD-10-CM | POA: Diagnosis not present

## 2020-01-12 DIAGNOSIS — I10 Essential (primary) hypertension: Secondary | ICD-10-CM | POA: Diagnosis not present

## 2020-01-12 DIAGNOSIS — L97522 Non-pressure chronic ulcer of other part of left foot with fat layer exposed: Secondary | ICD-10-CM | POA: Diagnosis not present

## 2020-01-12 DIAGNOSIS — E1142 Type 2 diabetes mellitus with diabetic polyneuropathy: Secondary | ICD-10-CM | POA: Diagnosis not present

## 2020-01-12 DIAGNOSIS — E612 Magnesium deficiency: Secondary | ICD-10-CM | POA: Diagnosis not present

## 2020-01-12 DIAGNOSIS — E1165 Type 2 diabetes mellitus with hyperglycemia: Secondary | ICD-10-CM | POA: Diagnosis not present

## 2020-01-12 DIAGNOSIS — J449 Chronic obstructive pulmonary disease, unspecified: Secondary | ICD-10-CM | POA: Diagnosis not present

## 2020-01-12 DIAGNOSIS — Z9181 History of falling: Secondary | ICD-10-CM | POA: Diagnosis not present

## 2020-01-12 DIAGNOSIS — Z9981 Dependence on supplemental oxygen: Secondary | ICD-10-CM | POA: Diagnosis not present

## 2020-01-12 DIAGNOSIS — D649 Anemia, unspecified: Secondary | ICD-10-CM | POA: Diagnosis not present

## 2020-01-12 DIAGNOSIS — K219 Gastro-esophageal reflux disease without esophagitis: Secondary | ICD-10-CM | POA: Diagnosis not present

## 2020-01-15 DIAGNOSIS — I10 Essential (primary) hypertension: Secondary | ICD-10-CM | POA: Diagnosis not present

## 2020-01-15 DIAGNOSIS — L97512 Non-pressure chronic ulcer of other part of right foot with fat layer exposed: Secondary | ICD-10-CM | POA: Diagnosis not present

## 2020-01-15 DIAGNOSIS — L97522 Non-pressure chronic ulcer of other part of left foot with fat layer exposed: Secondary | ICD-10-CM | POA: Diagnosis not present

## 2020-01-15 DIAGNOSIS — L97412 Non-pressure chronic ulcer of right heel and midfoot with fat layer exposed: Secondary | ICD-10-CM | POA: Diagnosis not present

## 2020-01-15 DIAGNOSIS — E11621 Type 2 diabetes mellitus with foot ulcer: Secondary | ICD-10-CM | POA: Diagnosis not present

## 2020-01-15 DIAGNOSIS — T8189XA Other complications of procedures, not elsewhere classified, initial encounter: Secondary | ICD-10-CM | POA: Diagnosis not present

## 2020-01-17 DIAGNOSIS — Z9981 Dependence on supplemental oxygen: Secondary | ICD-10-CM | POA: Diagnosis not present

## 2020-01-17 DIAGNOSIS — J449 Chronic obstructive pulmonary disease, unspecified: Secondary | ICD-10-CM | POA: Diagnosis not present

## 2020-01-17 DIAGNOSIS — I10 Essential (primary) hypertension: Secondary | ICD-10-CM | POA: Diagnosis not present

## 2020-01-17 DIAGNOSIS — E1165 Type 2 diabetes mellitus with hyperglycemia: Secondary | ICD-10-CM | POA: Diagnosis not present

## 2020-01-17 DIAGNOSIS — L97412 Non-pressure chronic ulcer of right heel and midfoot with fat layer exposed: Secondary | ICD-10-CM | POA: Diagnosis not present

## 2020-01-17 DIAGNOSIS — E612 Magnesium deficiency: Secondary | ICD-10-CM | POA: Diagnosis not present

## 2020-01-17 DIAGNOSIS — K219 Gastro-esophageal reflux disease without esophagitis: Secondary | ICD-10-CM | POA: Diagnosis not present

## 2020-01-17 DIAGNOSIS — L97522 Non-pressure chronic ulcer of other part of left foot with fat layer exposed: Secondary | ICD-10-CM | POA: Diagnosis not present

## 2020-01-17 DIAGNOSIS — D649 Anemia, unspecified: Secondary | ICD-10-CM | POA: Diagnosis not present

## 2020-01-17 DIAGNOSIS — E11621 Type 2 diabetes mellitus with foot ulcer: Secondary | ICD-10-CM | POA: Diagnosis not present

## 2020-01-17 DIAGNOSIS — Z794 Long term (current) use of insulin: Secondary | ICD-10-CM | POA: Diagnosis not present

## 2020-01-17 DIAGNOSIS — Z9181 History of falling: Secondary | ICD-10-CM | POA: Diagnosis not present

## 2020-01-17 DIAGNOSIS — Z79891 Long term (current) use of opiate analgesic: Secondary | ICD-10-CM | POA: Diagnosis not present

## 2020-01-17 DIAGNOSIS — E559 Vitamin D deficiency, unspecified: Secondary | ICD-10-CM | POA: Diagnosis not present

## 2020-01-17 DIAGNOSIS — E7800 Pure hypercholesterolemia, unspecified: Secondary | ICD-10-CM | POA: Diagnosis not present

## 2020-01-17 DIAGNOSIS — E1142 Type 2 diabetes mellitus with diabetic polyneuropathy: Secondary | ICD-10-CM | POA: Diagnosis not present

## 2020-01-19 DIAGNOSIS — L97412 Non-pressure chronic ulcer of right heel and midfoot with fat layer exposed: Secondary | ICD-10-CM | POA: Diagnosis not present

## 2020-01-19 DIAGNOSIS — D649 Anemia, unspecified: Secondary | ICD-10-CM | POA: Diagnosis not present

## 2020-01-19 DIAGNOSIS — L97522 Non-pressure chronic ulcer of other part of left foot with fat layer exposed: Secondary | ICD-10-CM | POA: Diagnosis not present

## 2020-01-19 DIAGNOSIS — J449 Chronic obstructive pulmonary disease, unspecified: Secondary | ICD-10-CM | POA: Diagnosis not present

## 2020-01-19 DIAGNOSIS — E1142 Type 2 diabetes mellitus with diabetic polyneuropathy: Secondary | ICD-10-CM | POA: Diagnosis not present

## 2020-01-19 DIAGNOSIS — Z79891 Long term (current) use of opiate analgesic: Secondary | ICD-10-CM | POA: Diagnosis not present

## 2020-01-19 DIAGNOSIS — Z9981 Dependence on supplemental oxygen: Secondary | ICD-10-CM | POA: Diagnosis not present

## 2020-01-19 DIAGNOSIS — E7800 Pure hypercholesterolemia, unspecified: Secondary | ICD-10-CM | POA: Diagnosis not present

## 2020-01-19 DIAGNOSIS — E1165 Type 2 diabetes mellitus with hyperglycemia: Secondary | ICD-10-CM | POA: Diagnosis not present

## 2020-01-19 DIAGNOSIS — E612 Magnesium deficiency: Secondary | ICD-10-CM | POA: Diagnosis not present

## 2020-01-19 DIAGNOSIS — Z794 Long term (current) use of insulin: Secondary | ICD-10-CM | POA: Diagnosis not present

## 2020-01-19 DIAGNOSIS — I10 Essential (primary) hypertension: Secondary | ICD-10-CM | POA: Diagnosis not present

## 2020-01-19 DIAGNOSIS — E11621 Type 2 diabetes mellitus with foot ulcer: Secondary | ICD-10-CM | POA: Diagnosis not present

## 2020-01-19 DIAGNOSIS — K219 Gastro-esophageal reflux disease without esophagitis: Secondary | ICD-10-CM | POA: Diagnosis not present

## 2020-01-19 DIAGNOSIS — Z9181 History of falling: Secondary | ICD-10-CM | POA: Diagnosis not present

## 2020-01-19 DIAGNOSIS — E559 Vitamin D deficiency, unspecified: Secondary | ICD-10-CM | POA: Diagnosis not present

## 2020-01-21 DIAGNOSIS — J449 Chronic obstructive pulmonary disease, unspecified: Secondary | ICD-10-CM | POA: Diagnosis not present

## 2020-01-22 DIAGNOSIS — L97512 Non-pressure chronic ulcer of other part of right foot with fat layer exposed: Secondary | ICD-10-CM | POA: Diagnosis not present

## 2020-01-22 DIAGNOSIS — T8189XA Other complications of procedures, not elsewhere classified, initial encounter: Secondary | ICD-10-CM | POA: Diagnosis not present

## 2020-01-22 DIAGNOSIS — E11621 Type 2 diabetes mellitus with foot ulcer: Secondary | ICD-10-CM | POA: Diagnosis not present

## 2020-01-22 DIAGNOSIS — L97412 Non-pressure chronic ulcer of right heel and midfoot with fat layer exposed: Secondary | ICD-10-CM | POA: Diagnosis not present

## 2020-01-22 DIAGNOSIS — I10 Essential (primary) hypertension: Secondary | ICD-10-CM | POA: Diagnosis not present

## 2020-01-24 DIAGNOSIS — E1165 Type 2 diabetes mellitus with hyperglycemia: Secondary | ICD-10-CM | POA: Diagnosis not present

## 2020-01-24 DIAGNOSIS — E1142 Type 2 diabetes mellitus with diabetic polyneuropathy: Secondary | ICD-10-CM | POA: Diagnosis not present

## 2020-01-24 DIAGNOSIS — G894 Chronic pain syndrome: Secondary | ICD-10-CM | POA: Diagnosis not present

## 2020-01-24 DIAGNOSIS — I10 Essential (primary) hypertension: Secondary | ICD-10-CM | POA: Diagnosis not present

## 2020-01-24 DIAGNOSIS — Z79891 Long term (current) use of opiate analgesic: Secondary | ICD-10-CM | POA: Diagnosis not present

## 2020-01-24 DIAGNOSIS — K219 Gastro-esophageal reflux disease without esophagitis: Secondary | ICD-10-CM | POA: Diagnosis not present

## 2020-01-24 DIAGNOSIS — E559 Vitamin D deficiency, unspecified: Secondary | ICD-10-CM | POA: Diagnosis not present

## 2020-01-24 DIAGNOSIS — Z794 Long term (current) use of insulin: Secondary | ICD-10-CM | POA: Diagnosis not present

## 2020-01-24 DIAGNOSIS — E11621 Type 2 diabetes mellitus with foot ulcer: Secondary | ICD-10-CM | POA: Diagnosis not present

## 2020-01-24 DIAGNOSIS — Z9981 Dependence on supplemental oxygen: Secondary | ICD-10-CM | POA: Diagnosis not present

## 2020-01-24 DIAGNOSIS — L97412 Non-pressure chronic ulcer of right heel and midfoot with fat layer exposed: Secondary | ICD-10-CM | POA: Diagnosis not present

## 2020-01-24 DIAGNOSIS — D649 Anemia, unspecified: Secondary | ICD-10-CM | POA: Diagnosis not present

## 2020-01-24 DIAGNOSIS — J449 Chronic obstructive pulmonary disease, unspecified: Secondary | ICD-10-CM | POA: Diagnosis not present

## 2020-01-24 DIAGNOSIS — L97522 Non-pressure chronic ulcer of other part of left foot with fat layer exposed: Secondary | ICD-10-CM | POA: Diagnosis not present

## 2020-01-24 DIAGNOSIS — E7800 Pure hypercholesterolemia, unspecified: Secondary | ICD-10-CM | POA: Diagnosis not present

## 2020-01-24 DIAGNOSIS — M47816 Spondylosis without myelopathy or radiculopathy, lumbar region: Secondary | ICD-10-CM | POA: Diagnosis not present

## 2020-01-24 DIAGNOSIS — E612 Magnesium deficiency: Secondary | ICD-10-CM | POA: Diagnosis not present

## 2020-01-24 DIAGNOSIS — Z9181 History of falling: Secondary | ICD-10-CM | POA: Diagnosis not present

## 2020-01-25 DIAGNOSIS — Z9981 Dependence on supplemental oxygen: Secondary | ICD-10-CM | POA: Diagnosis not present

## 2020-01-25 DIAGNOSIS — E7800 Pure hypercholesterolemia, unspecified: Secondary | ICD-10-CM | POA: Diagnosis not present

## 2020-01-25 DIAGNOSIS — Z79891 Long term (current) use of opiate analgesic: Secondary | ICD-10-CM | POA: Diagnosis not present

## 2020-01-25 DIAGNOSIS — E1165 Type 2 diabetes mellitus with hyperglycemia: Secondary | ICD-10-CM | POA: Diagnosis not present

## 2020-01-25 DIAGNOSIS — L97412 Non-pressure chronic ulcer of right heel and midfoot with fat layer exposed: Secondary | ICD-10-CM | POA: Diagnosis not present

## 2020-01-25 DIAGNOSIS — Z9181 History of falling: Secondary | ICD-10-CM | POA: Diagnosis not present

## 2020-01-25 DIAGNOSIS — E612 Magnesium deficiency: Secondary | ICD-10-CM | POA: Diagnosis not present

## 2020-01-25 DIAGNOSIS — I10 Essential (primary) hypertension: Secondary | ICD-10-CM | POA: Diagnosis not present

## 2020-01-25 DIAGNOSIS — K219 Gastro-esophageal reflux disease without esophagitis: Secondary | ICD-10-CM | POA: Diagnosis not present

## 2020-01-25 DIAGNOSIS — E11621 Type 2 diabetes mellitus with foot ulcer: Secondary | ICD-10-CM | POA: Diagnosis not present

## 2020-01-25 DIAGNOSIS — E1142 Type 2 diabetes mellitus with diabetic polyneuropathy: Secondary | ICD-10-CM | POA: Diagnosis not present

## 2020-01-25 DIAGNOSIS — L97522 Non-pressure chronic ulcer of other part of left foot with fat layer exposed: Secondary | ICD-10-CM | POA: Diagnosis not present

## 2020-01-25 DIAGNOSIS — J449 Chronic obstructive pulmonary disease, unspecified: Secondary | ICD-10-CM | POA: Diagnosis not present

## 2020-01-25 DIAGNOSIS — D649 Anemia, unspecified: Secondary | ICD-10-CM | POA: Diagnosis not present

## 2020-01-25 DIAGNOSIS — Z794 Long term (current) use of insulin: Secondary | ICD-10-CM | POA: Diagnosis not present

## 2020-01-25 DIAGNOSIS — E559 Vitamin D deficiency, unspecified: Secondary | ICD-10-CM | POA: Diagnosis not present

## 2020-01-26 DIAGNOSIS — E1165 Type 2 diabetes mellitus with hyperglycemia: Secondary | ICD-10-CM | POA: Diagnosis not present

## 2020-01-26 DIAGNOSIS — L97522 Non-pressure chronic ulcer of other part of left foot with fat layer exposed: Secondary | ICD-10-CM | POA: Diagnosis not present

## 2020-01-26 DIAGNOSIS — D649 Anemia, unspecified: Secondary | ICD-10-CM | POA: Diagnosis not present

## 2020-01-26 DIAGNOSIS — E612 Magnesium deficiency: Secondary | ICD-10-CM | POA: Diagnosis not present

## 2020-01-26 DIAGNOSIS — Z9181 History of falling: Secondary | ICD-10-CM | POA: Diagnosis not present

## 2020-01-26 DIAGNOSIS — E7800 Pure hypercholesterolemia, unspecified: Secondary | ICD-10-CM | POA: Diagnosis not present

## 2020-01-26 DIAGNOSIS — I10 Essential (primary) hypertension: Secondary | ICD-10-CM | POA: Diagnosis not present

## 2020-01-26 DIAGNOSIS — Z9981 Dependence on supplemental oxygen: Secondary | ICD-10-CM | POA: Diagnosis not present

## 2020-01-26 DIAGNOSIS — L97412 Non-pressure chronic ulcer of right heel and midfoot with fat layer exposed: Secondary | ICD-10-CM | POA: Diagnosis not present

## 2020-01-26 DIAGNOSIS — E559 Vitamin D deficiency, unspecified: Secondary | ICD-10-CM | POA: Diagnosis not present

## 2020-01-26 DIAGNOSIS — Z79891 Long term (current) use of opiate analgesic: Secondary | ICD-10-CM | POA: Diagnosis not present

## 2020-01-26 DIAGNOSIS — E1142 Type 2 diabetes mellitus with diabetic polyneuropathy: Secondary | ICD-10-CM | POA: Diagnosis not present

## 2020-01-26 DIAGNOSIS — K219 Gastro-esophageal reflux disease without esophagitis: Secondary | ICD-10-CM | POA: Diagnosis not present

## 2020-01-26 DIAGNOSIS — J449 Chronic obstructive pulmonary disease, unspecified: Secondary | ICD-10-CM | POA: Diagnosis not present

## 2020-01-26 DIAGNOSIS — E11621 Type 2 diabetes mellitus with foot ulcer: Secondary | ICD-10-CM | POA: Diagnosis not present

## 2020-01-26 DIAGNOSIS — Z794 Long term (current) use of insulin: Secondary | ICD-10-CM | POA: Diagnosis not present

## 2020-01-29 DIAGNOSIS — L97412 Non-pressure chronic ulcer of right heel and midfoot with fat layer exposed: Secondary | ICD-10-CM | POA: Diagnosis not present

## 2020-01-29 DIAGNOSIS — L97512 Non-pressure chronic ulcer of other part of right foot with fat layer exposed: Secondary | ICD-10-CM | POA: Diagnosis not present

## 2020-01-29 DIAGNOSIS — E11621 Type 2 diabetes mellitus with foot ulcer: Secondary | ICD-10-CM | POA: Diagnosis not present

## 2020-01-29 DIAGNOSIS — I10 Essential (primary) hypertension: Secondary | ICD-10-CM | POA: Diagnosis not present

## 2020-01-29 DIAGNOSIS — T8189XA Other complications of procedures, not elsewhere classified, initial encounter: Secondary | ICD-10-CM | POA: Diagnosis not present

## 2020-01-31 DIAGNOSIS — E1142 Type 2 diabetes mellitus with diabetic polyneuropathy: Secondary | ICD-10-CM | POA: Diagnosis not present

## 2020-01-31 DIAGNOSIS — D649 Anemia, unspecified: Secondary | ICD-10-CM | POA: Diagnosis not present

## 2020-01-31 DIAGNOSIS — E11621 Type 2 diabetes mellitus with foot ulcer: Secondary | ICD-10-CM | POA: Diagnosis not present

## 2020-01-31 DIAGNOSIS — E1165 Type 2 diabetes mellitus with hyperglycemia: Secondary | ICD-10-CM | POA: Diagnosis not present

## 2020-01-31 DIAGNOSIS — Z794 Long term (current) use of insulin: Secondary | ICD-10-CM | POA: Diagnosis not present

## 2020-01-31 DIAGNOSIS — L97412 Non-pressure chronic ulcer of right heel and midfoot with fat layer exposed: Secondary | ICD-10-CM | POA: Diagnosis not present

## 2020-01-31 DIAGNOSIS — E7800 Pure hypercholesterolemia, unspecified: Secondary | ICD-10-CM | POA: Diagnosis not present

## 2020-01-31 DIAGNOSIS — E559 Vitamin D deficiency, unspecified: Secondary | ICD-10-CM | POA: Diagnosis not present

## 2020-01-31 DIAGNOSIS — E612 Magnesium deficiency: Secondary | ICD-10-CM | POA: Diagnosis not present

## 2020-01-31 DIAGNOSIS — L97522 Non-pressure chronic ulcer of other part of left foot with fat layer exposed: Secondary | ICD-10-CM | POA: Diagnosis not present

## 2020-01-31 DIAGNOSIS — Z9181 History of falling: Secondary | ICD-10-CM | POA: Diagnosis not present

## 2020-01-31 DIAGNOSIS — J449 Chronic obstructive pulmonary disease, unspecified: Secondary | ICD-10-CM | POA: Diagnosis not present

## 2020-01-31 DIAGNOSIS — I10 Essential (primary) hypertension: Secondary | ICD-10-CM | POA: Diagnosis not present

## 2020-01-31 DIAGNOSIS — Z79891 Long term (current) use of opiate analgesic: Secondary | ICD-10-CM | POA: Diagnosis not present

## 2020-01-31 DIAGNOSIS — Z9981 Dependence on supplemental oxygen: Secondary | ICD-10-CM | POA: Diagnosis not present

## 2020-01-31 DIAGNOSIS — K219 Gastro-esophageal reflux disease without esophagitis: Secondary | ICD-10-CM | POA: Diagnosis not present

## 2020-02-02 DIAGNOSIS — E612 Magnesium deficiency: Secondary | ICD-10-CM | POA: Diagnosis not present

## 2020-02-02 DIAGNOSIS — Z9181 History of falling: Secondary | ICD-10-CM | POA: Diagnosis not present

## 2020-02-02 DIAGNOSIS — K219 Gastro-esophageal reflux disease without esophagitis: Secondary | ICD-10-CM | POA: Diagnosis not present

## 2020-02-02 DIAGNOSIS — Z79891 Long term (current) use of opiate analgesic: Secondary | ICD-10-CM | POA: Diagnosis not present

## 2020-02-02 DIAGNOSIS — Z9981 Dependence on supplemental oxygen: Secondary | ICD-10-CM | POA: Diagnosis not present

## 2020-02-02 DIAGNOSIS — L97522 Non-pressure chronic ulcer of other part of left foot with fat layer exposed: Secondary | ICD-10-CM | POA: Diagnosis not present

## 2020-02-02 DIAGNOSIS — I10 Essential (primary) hypertension: Secondary | ICD-10-CM | POA: Diagnosis not present

## 2020-02-02 DIAGNOSIS — Z794 Long term (current) use of insulin: Secondary | ICD-10-CM | POA: Diagnosis not present

## 2020-02-02 DIAGNOSIS — E7800 Pure hypercholesterolemia, unspecified: Secondary | ICD-10-CM | POA: Diagnosis not present

## 2020-02-02 DIAGNOSIS — D649 Anemia, unspecified: Secondary | ICD-10-CM | POA: Diagnosis not present

## 2020-02-02 DIAGNOSIS — E1165 Type 2 diabetes mellitus with hyperglycemia: Secondary | ICD-10-CM | POA: Diagnosis not present

## 2020-02-02 DIAGNOSIS — L97412 Non-pressure chronic ulcer of right heel and midfoot with fat layer exposed: Secondary | ICD-10-CM | POA: Diagnosis not present

## 2020-02-02 DIAGNOSIS — J449 Chronic obstructive pulmonary disease, unspecified: Secondary | ICD-10-CM | POA: Diagnosis not present

## 2020-02-02 DIAGNOSIS — E11621 Type 2 diabetes mellitus with foot ulcer: Secondary | ICD-10-CM | POA: Diagnosis not present

## 2020-02-02 DIAGNOSIS — E1142 Type 2 diabetes mellitus with diabetic polyneuropathy: Secondary | ICD-10-CM | POA: Diagnosis not present

## 2020-02-02 DIAGNOSIS — E559 Vitamin D deficiency, unspecified: Secondary | ICD-10-CM | POA: Diagnosis not present

## 2020-02-05 DIAGNOSIS — I10 Essential (primary) hypertension: Secondary | ICD-10-CM | POA: Diagnosis not present

## 2020-02-05 DIAGNOSIS — E11621 Type 2 diabetes mellitus with foot ulcer: Secondary | ICD-10-CM | POA: Diagnosis not present

## 2020-02-05 DIAGNOSIS — L97512 Non-pressure chronic ulcer of other part of right foot with fat layer exposed: Secondary | ICD-10-CM | POA: Diagnosis not present

## 2020-02-05 DIAGNOSIS — T8189XA Other complications of procedures, not elsewhere classified, initial encounter: Secondary | ICD-10-CM | POA: Diagnosis not present

## 2020-02-07 DIAGNOSIS — E612 Magnesium deficiency: Secondary | ICD-10-CM | POA: Diagnosis not present

## 2020-02-07 DIAGNOSIS — J449 Chronic obstructive pulmonary disease, unspecified: Secondary | ICD-10-CM | POA: Diagnosis not present

## 2020-02-07 DIAGNOSIS — Z794 Long term (current) use of insulin: Secondary | ICD-10-CM | POA: Diagnosis not present

## 2020-02-07 DIAGNOSIS — E7800 Pure hypercholesterolemia, unspecified: Secondary | ICD-10-CM | POA: Diagnosis not present

## 2020-02-07 DIAGNOSIS — I10 Essential (primary) hypertension: Secondary | ICD-10-CM | POA: Diagnosis not present

## 2020-02-07 DIAGNOSIS — Z79891 Long term (current) use of opiate analgesic: Secondary | ICD-10-CM | POA: Diagnosis not present

## 2020-02-07 DIAGNOSIS — D649 Anemia, unspecified: Secondary | ICD-10-CM | POA: Diagnosis not present

## 2020-02-07 DIAGNOSIS — E1165 Type 2 diabetes mellitus with hyperglycemia: Secondary | ICD-10-CM | POA: Diagnosis not present

## 2020-02-07 DIAGNOSIS — L97412 Non-pressure chronic ulcer of right heel and midfoot with fat layer exposed: Secondary | ICD-10-CM | POA: Diagnosis not present

## 2020-02-07 DIAGNOSIS — Z9181 History of falling: Secondary | ICD-10-CM | POA: Diagnosis not present

## 2020-02-07 DIAGNOSIS — Z9981 Dependence on supplemental oxygen: Secondary | ICD-10-CM | POA: Diagnosis not present

## 2020-02-07 DIAGNOSIS — E11621 Type 2 diabetes mellitus with foot ulcer: Secondary | ICD-10-CM | POA: Diagnosis not present

## 2020-02-07 DIAGNOSIS — L97522 Non-pressure chronic ulcer of other part of left foot with fat layer exposed: Secondary | ICD-10-CM | POA: Diagnosis not present

## 2020-02-07 DIAGNOSIS — E559 Vitamin D deficiency, unspecified: Secondary | ICD-10-CM | POA: Diagnosis not present

## 2020-02-07 DIAGNOSIS — E1142 Type 2 diabetes mellitus with diabetic polyneuropathy: Secondary | ICD-10-CM | POA: Diagnosis not present

## 2020-02-07 DIAGNOSIS — K219 Gastro-esophageal reflux disease without esophagitis: Secondary | ICD-10-CM | POA: Diagnosis not present

## 2020-02-09 DIAGNOSIS — D649 Anemia, unspecified: Secondary | ICD-10-CM | POA: Diagnosis not present

## 2020-02-09 DIAGNOSIS — K219 Gastro-esophageal reflux disease without esophagitis: Secondary | ICD-10-CM | POA: Diagnosis not present

## 2020-02-09 DIAGNOSIS — L97522 Non-pressure chronic ulcer of other part of left foot with fat layer exposed: Secondary | ICD-10-CM | POA: Diagnosis not present

## 2020-02-09 DIAGNOSIS — Z9981 Dependence on supplemental oxygen: Secondary | ICD-10-CM | POA: Diagnosis not present

## 2020-02-09 DIAGNOSIS — E1142 Type 2 diabetes mellitus with diabetic polyneuropathy: Secondary | ICD-10-CM | POA: Diagnosis not present

## 2020-02-09 DIAGNOSIS — E7800 Pure hypercholesterolemia, unspecified: Secondary | ICD-10-CM | POA: Diagnosis not present

## 2020-02-09 DIAGNOSIS — Z794 Long term (current) use of insulin: Secondary | ICD-10-CM | POA: Diagnosis not present

## 2020-02-09 DIAGNOSIS — J449 Chronic obstructive pulmonary disease, unspecified: Secondary | ICD-10-CM | POA: Diagnosis not present

## 2020-02-09 DIAGNOSIS — E559 Vitamin D deficiency, unspecified: Secondary | ICD-10-CM | POA: Diagnosis not present

## 2020-02-09 DIAGNOSIS — I10 Essential (primary) hypertension: Secondary | ICD-10-CM | POA: Diagnosis not present

## 2020-02-09 DIAGNOSIS — L97412 Non-pressure chronic ulcer of right heel and midfoot with fat layer exposed: Secondary | ICD-10-CM | POA: Diagnosis not present

## 2020-02-09 DIAGNOSIS — E11621 Type 2 diabetes mellitus with foot ulcer: Secondary | ICD-10-CM | POA: Diagnosis not present

## 2020-02-09 DIAGNOSIS — E1165 Type 2 diabetes mellitus with hyperglycemia: Secondary | ICD-10-CM | POA: Diagnosis not present

## 2020-02-09 DIAGNOSIS — E612 Magnesium deficiency: Secondary | ICD-10-CM | POA: Diagnosis not present

## 2020-02-09 DIAGNOSIS — Z79891 Long term (current) use of opiate analgesic: Secondary | ICD-10-CM | POA: Diagnosis not present

## 2020-02-09 DIAGNOSIS — Z9181 History of falling: Secondary | ICD-10-CM | POA: Diagnosis not present

## 2020-02-12 DIAGNOSIS — L97412 Non-pressure chronic ulcer of right heel and midfoot with fat layer exposed: Secondary | ICD-10-CM | POA: Diagnosis not present

## 2020-02-12 DIAGNOSIS — I10 Essential (primary) hypertension: Secondary | ICD-10-CM | POA: Diagnosis not present

## 2020-02-12 DIAGNOSIS — E11621 Type 2 diabetes mellitus with foot ulcer: Secondary | ICD-10-CM | POA: Diagnosis not present

## 2020-02-12 DIAGNOSIS — L97512 Non-pressure chronic ulcer of other part of right foot with fat layer exposed: Secondary | ICD-10-CM | POA: Diagnosis not present

## 2020-02-14 DIAGNOSIS — K219 Gastro-esophageal reflux disease without esophagitis: Secondary | ICD-10-CM | POA: Diagnosis not present

## 2020-02-14 DIAGNOSIS — E559 Vitamin D deficiency, unspecified: Secondary | ICD-10-CM | POA: Diagnosis not present

## 2020-02-14 DIAGNOSIS — E612 Magnesium deficiency: Secondary | ICD-10-CM | POA: Diagnosis not present

## 2020-02-14 DIAGNOSIS — Z794 Long term (current) use of insulin: Secondary | ICD-10-CM | POA: Diagnosis not present

## 2020-02-14 DIAGNOSIS — D649 Anemia, unspecified: Secondary | ICD-10-CM | POA: Diagnosis not present

## 2020-02-14 DIAGNOSIS — J449 Chronic obstructive pulmonary disease, unspecified: Secondary | ICD-10-CM | POA: Diagnosis not present

## 2020-02-14 DIAGNOSIS — E1165 Type 2 diabetes mellitus with hyperglycemia: Secondary | ICD-10-CM | POA: Diagnosis not present

## 2020-02-14 DIAGNOSIS — I10 Essential (primary) hypertension: Secondary | ICD-10-CM | POA: Diagnosis not present

## 2020-02-14 DIAGNOSIS — E11621 Type 2 diabetes mellitus with foot ulcer: Secondary | ICD-10-CM | POA: Diagnosis not present

## 2020-02-14 DIAGNOSIS — E1142 Type 2 diabetes mellitus with diabetic polyneuropathy: Secondary | ICD-10-CM | POA: Diagnosis not present

## 2020-02-14 DIAGNOSIS — E7800 Pure hypercholesterolemia, unspecified: Secondary | ICD-10-CM | POA: Diagnosis not present

## 2020-02-14 DIAGNOSIS — Z9981 Dependence on supplemental oxygen: Secondary | ICD-10-CM | POA: Diagnosis not present

## 2020-02-14 DIAGNOSIS — Z79891 Long term (current) use of opiate analgesic: Secondary | ICD-10-CM | POA: Diagnosis not present

## 2020-02-14 DIAGNOSIS — L97522 Non-pressure chronic ulcer of other part of left foot with fat layer exposed: Secondary | ICD-10-CM | POA: Diagnosis not present

## 2020-02-14 DIAGNOSIS — L97412 Non-pressure chronic ulcer of right heel and midfoot with fat layer exposed: Secondary | ICD-10-CM | POA: Diagnosis not present

## 2020-02-14 DIAGNOSIS — Z9181 History of falling: Secondary | ICD-10-CM | POA: Diagnosis not present

## 2020-02-19 DIAGNOSIS — Z79891 Long term (current) use of opiate analgesic: Secondary | ICD-10-CM | POA: Diagnosis not present

## 2020-02-19 DIAGNOSIS — E559 Vitamin D deficiency, unspecified: Secondary | ICD-10-CM | POA: Diagnosis not present

## 2020-02-19 DIAGNOSIS — J449 Chronic obstructive pulmonary disease, unspecified: Secondary | ICD-10-CM | POA: Diagnosis not present

## 2020-02-19 DIAGNOSIS — E7800 Pure hypercholesterolemia, unspecified: Secondary | ICD-10-CM | POA: Diagnosis not present

## 2020-02-19 DIAGNOSIS — D649 Anemia, unspecified: Secondary | ICD-10-CM | POA: Diagnosis not present

## 2020-02-19 DIAGNOSIS — Z9981 Dependence on supplemental oxygen: Secondary | ICD-10-CM | POA: Diagnosis not present

## 2020-02-19 DIAGNOSIS — L97412 Non-pressure chronic ulcer of right heel and midfoot with fat layer exposed: Secondary | ICD-10-CM | POA: Diagnosis not present

## 2020-02-19 DIAGNOSIS — E1165 Type 2 diabetes mellitus with hyperglycemia: Secondary | ICD-10-CM | POA: Diagnosis not present

## 2020-02-19 DIAGNOSIS — L97522 Non-pressure chronic ulcer of other part of left foot with fat layer exposed: Secondary | ICD-10-CM | POA: Diagnosis not present

## 2020-02-19 DIAGNOSIS — K219 Gastro-esophageal reflux disease without esophagitis: Secondary | ICD-10-CM | POA: Diagnosis not present

## 2020-02-19 DIAGNOSIS — Z9181 History of falling: Secondary | ICD-10-CM | POA: Diagnosis not present

## 2020-02-19 DIAGNOSIS — Z794 Long term (current) use of insulin: Secondary | ICD-10-CM | POA: Diagnosis not present

## 2020-02-19 DIAGNOSIS — E612 Magnesium deficiency: Secondary | ICD-10-CM | POA: Diagnosis not present

## 2020-02-19 DIAGNOSIS — E11621 Type 2 diabetes mellitus with foot ulcer: Secondary | ICD-10-CM | POA: Diagnosis not present

## 2020-02-19 DIAGNOSIS — I10 Essential (primary) hypertension: Secondary | ICD-10-CM | POA: Diagnosis not present

## 2020-02-19 DIAGNOSIS — E1142 Type 2 diabetes mellitus with diabetic polyneuropathy: Secondary | ICD-10-CM | POA: Diagnosis not present

## 2020-02-20 DIAGNOSIS — J449 Chronic obstructive pulmonary disease, unspecified: Secondary | ICD-10-CM | POA: Diagnosis not present

## 2020-02-21 DIAGNOSIS — Z794 Long term (current) use of insulin: Secondary | ICD-10-CM | POA: Diagnosis not present

## 2020-02-21 DIAGNOSIS — Z79891 Long term (current) use of opiate analgesic: Secondary | ICD-10-CM | POA: Diagnosis not present

## 2020-02-21 DIAGNOSIS — J449 Chronic obstructive pulmonary disease, unspecified: Secondary | ICD-10-CM | POA: Diagnosis not present

## 2020-02-21 DIAGNOSIS — E11621 Type 2 diabetes mellitus with foot ulcer: Secondary | ICD-10-CM | POA: Diagnosis not present

## 2020-02-21 DIAGNOSIS — Z9981 Dependence on supplemental oxygen: Secondary | ICD-10-CM | POA: Diagnosis not present

## 2020-02-21 DIAGNOSIS — K219 Gastro-esophageal reflux disease without esophagitis: Secondary | ICD-10-CM | POA: Diagnosis not present

## 2020-02-21 DIAGNOSIS — E559 Vitamin D deficiency, unspecified: Secondary | ICD-10-CM | POA: Diagnosis not present

## 2020-02-21 DIAGNOSIS — I10 Essential (primary) hypertension: Secondary | ICD-10-CM | POA: Diagnosis not present

## 2020-02-21 DIAGNOSIS — L97412 Non-pressure chronic ulcer of right heel and midfoot with fat layer exposed: Secondary | ICD-10-CM | POA: Diagnosis not present

## 2020-02-21 DIAGNOSIS — E612 Magnesium deficiency: Secondary | ICD-10-CM | POA: Diagnosis not present

## 2020-02-21 DIAGNOSIS — E7800 Pure hypercholesterolemia, unspecified: Secondary | ICD-10-CM | POA: Diagnosis not present

## 2020-02-21 DIAGNOSIS — L97522 Non-pressure chronic ulcer of other part of left foot with fat layer exposed: Secondary | ICD-10-CM | POA: Diagnosis not present

## 2020-02-21 DIAGNOSIS — E1142 Type 2 diabetes mellitus with diabetic polyneuropathy: Secondary | ICD-10-CM | POA: Diagnosis not present

## 2020-02-21 DIAGNOSIS — Z9181 History of falling: Secondary | ICD-10-CM | POA: Diagnosis not present

## 2020-02-21 DIAGNOSIS — D649 Anemia, unspecified: Secondary | ICD-10-CM | POA: Diagnosis not present

## 2020-02-21 DIAGNOSIS — E1165 Type 2 diabetes mellitus with hyperglycemia: Secondary | ICD-10-CM | POA: Diagnosis not present

## 2020-02-23 DIAGNOSIS — E11621 Type 2 diabetes mellitus with foot ulcer: Secondary | ICD-10-CM | POA: Diagnosis not present

## 2020-02-23 DIAGNOSIS — G894 Chronic pain syndrome: Secondary | ICD-10-CM | POA: Diagnosis not present

## 2020-02-23 DIAGNOSIS — E7800 Pure hypercholesterolemia, unspecified: Secondary | ICD-10-CM | POA: Diagnosis not present

## 2020-02-23 DIAGNOSIS — I10 Essential (primary) hypertension: Secondary | ICD-10-CM | POA: Diagnosis not present

## 2020-02-23 DIAGNOSIS — K219 Gastro-esophageal reflux disease without esophagitis: Secondary | ICD-10-CM | POA: Diagnosis not present

## 2020-02-23 DIAGNOSIS — L97412 Non-pressure chronic ulcer of right heel and midfoot with fat layer exposed: Secondary | ICD-10-CM | POA: Diagnosis not present

## 2020-02-23 DIAGNOSIS — D649 Anemia, unspecified: Secondary | ICD-10-CM | POA: Diagnosis not present

## 2020-02-23 DIAGNOSIS — E559 Vitamin D deficiency, unspecified: Secondary | ICD-10-CM | POA: Diagnosis not present

## 2020-02-23 DIAGNOSIS — Z9981 Dependence on supplemental oxygen: Secondary | ICD-10-CM | POA: Diagnosis not present

## 2020-02-23 DIAGNOSIS — E612 Magnesium deficiency: Secondary | ICD-10-CM | POA: Diagnosis not present

## 2020-02-23 DIAGNOSIS — L97522 Non-pressure chronic ulcer of other part of left foot with fat layer exposed: Secondary | ICD-10-CM | POA: Diagnosis not present

## 2020-02-23 DIAGNOSIS — Z794 Long term (current) use of insulin: Secondary | ICD-10-CM | POA: Diagnosis not present

## 2020-02-23 DIAGNOSIS — M47816 Spondylosis without myelopathy or radiculopathy, lumbar region: Secondary | ICD-10-CM | POA: Diagnosis not present

## 2020-02-23 DIAGNOSIS — Z79891 Long term (current) use of opiate analgesic: Secondary | ICD-10-CM | POA: Diagnosis not present

## 2020-02-23 DIAGNOSIS — J449 Chronic obstructive pulmonary disease, unspecified: Secondary | ICD-10-CM | POA: Diagnosis not present

## 2020-02-23 DIAGNOSIS — Z9181 History of falling: Secondary | ICD-10-CM | POA: Diagnosis not present

## 2020-02-23 DIAGNOSIS — E1142 Type 2 diabetes mellitus with diabetic polyneuropathy: Secondary | ICD-10-CM | POA: Diagnosis not present

## 2020-02-23 DIAGNOSIS — E1165 Type 2 diabetes mellitus with hyperglycemia: Secondary | ICD-10-CM | POA: Diagnosis not present

## 2020-02-26 DIAGNOSIS — I10 Essential (primary) hypertension: Secondary | ICD-10-CM | POA: Diagnosis not present

## 2020-02-26 DIAGNOSIS — L97412 Non-pressure chronic ulcer of right heel and midfoot with fat layer exposed: Secondary | ICD-10-CM | POA: Diagnosis not present

## 2020-02-26 DIAGNOSIS — E11621 Type 2 diabetes mellitus with foot ulcer: Secondary | ICD-10-CM | POA: Diagnosis not present

## 2020-02-26 DIAGNOSIS — L97512 Non-pressure chronic ulcer of other part of right foot with fat layer exposed: Secondary | ICD-10-CM | POA: Diagnosis not present

## 2020-02-27 DIAGNOSIS — Z794 Long term (current) use of insulin: Secondary | ICD-10-CM | POA: Diagnosis not present

## 2020-02-27 DIAGNOSIS — E1165 Type 2 diabetes mellitus with hyperglycemia: Secondary | ICD-10-CM | POA: Diagnosis not present

## 2020-02-27 DIAGNOSIS — I10 Essential (primary) hypertension: Secondary | ICD-10-CM | POA: Diagnosis not present

## 2020-02-27 DIAGNOSIS — Z9181 History of falling: Secondary | ICD-10-CM | POA: Diagnosis not present

## 2020-02-27 DIAGNOSIS — J449 Chronic obstructive pulmonary disease, unspecified: Secondary | ICD-10-CM | POA: Diagnosis not present

## 2020-02-27 DIAGNOSIS — E612 Magnesium deficiency: Secondary | ICD-10-CM | POA: Diagnosis not present

## 2020-02-27 DIAGNOSIS — E11621 Type 2 diabetes mellitus with foot ulcer: Secondary | ICD-10-CM | POA: Diagnosis not present

## 2020-02-27 DIAGNOSIS — Z9981 Dependence on supplemental oxygen: Secondary | ICD-10-CM | POA: Diagnosis not present

## 2020-02-27 DIAGNOSIS — L97412 Non-pressure chronic ulcer of right heel and midfoot with fat layer exposed: Secondary | ICD-10-CM | POA: Diagnosis not present

## 2020-02-27 DIAGNOSIS — D649 Anemia, unspecified: Secondary | ICD-10-CM | POA: Diagnosis not present

## 2020-02-27 DIAGNOSIS — E559 Vitamin D deficiency, unspecified: Secondary | ICD-10-CM | POA: Diagnosis not present

## 2020-02-27 DIAGNOSIS — E1142 Type 2 diabetes mellitus with diabetic polyneuropathy: Secondary | ICD-10-CM | POA: Diagnosis not present

## 2020-02-27 DIAGNOSIS — K219 Gastro-esophageal reflux disease without esophagitis: Secondary | ICD-10-CM | POA: Diagnosis not present

## 2020-02-27 DIAGNOSIS — L97522 Non-pressure chronic ulcer of other part of left foot with fat layer exposed: Secondary | ICD-10-CM | POA: Diagnosis not present

## 2020-02-27 DIAGNOSIS — E7800 Pure hypercholesterolemia, unspecified: Secondary | ICD-10-CM | POA: Diagnosis not present

## 2020-02-27 DIAGNOSIS — Z79891 Long term (current) use of opiate analgesic: Secondary | ICD-10-CM | POA: Diagnosis not present

## 2020-02-28 DIAGNOSIS — E559 Vitamin D deficiency, unspecified: Secondary | ICD-10-CM | POA: Diagnosis not present

## 2020-02-28 DIAGNOSIS — Z79891 Long term (current) use of opiate analgesic: Secondary | ICD-10-CM | POA: Diagnosis not present

## 2020-02-28 DIAGNOSIS — D649 Anemia, unspecified: Secondary | ICD-10-CM | POA: Diagnosis not present

## 2020-02-28 DIAGNOSIS — L97522 Non-pressure chronic ulcer of other part of left foot with fat layer exposed: Secondary | ICD-10-CM | POA: Diagnosis not present

## 2020-02-28 DIAGNOSIS — Z794 Long term (current) use of insulin: Secondary | ICD-10-CM | POA: Diagnosis not present

## 2020-02-28 DIAGNOSIS — J449 Chronic obstructive pulmonary disease, unspecified: Secondary | ICD-10-CM | POA: Diagnosis not present

## 2020-02-28 DIAGNOSIS — E7800 Pure hypercholesterolemia, unspecified: Secondary | ICD-10-CM | POA: Diagnosis not present

## 2020-02-28 DIAGNOSIS — Z9181 History of falling: Secondary | ICD-10-CM | POA: Diagnosis not present

## 2020-02-28 DIAGNOSIS — Z9981 Dependence on supplemental oxygen: Secondary | ICD-10-CM | POA: Diagnosis not present

## 2020-02-28 DIAGNOSIS — K219 Gastro-esophageal reflux disease without esophagitis: Secondary | ICD-10-CM | POA: Diagnosis not present

## 2020-02-28 DIAGNOSIS — E612 Magnesium deficiency: Secondary | ICD-10-CM | POA: Diagnosis not present

## 2020-02-28 DIAGNOSIS — E1142 Type 2 diabetes mellitus with diabetic polyneuropathy: Secondary | ICD-10-CM | POA: Diagnosis not present

## 2020-02-28 DIAGNOSIS — E11621 Type 2 diabetes mellitus with foot ulcer: Secondary | ICD-10-CM | POA: Diagnosis not present

## 2020-02-28 DIAGNOSIS — L97412 Non-pressure chronic ulcer of right heel and midfoot with fat layer exposed: Secondary | ICD-10-CM | POA: Diagnosis not present

## 2020-02-28 DIAGNOSIS — I10 Essential (primary) hypertension: Secondary | ICD-10-CM | POA: Diagnosis not present

## 2020-02-28 DIAGNOSIS — E1165 Type 2 diabetes mellitus with hyperglycemia: Secondary | ICD-10-CM | POA: Diagnosis not present

## 2020-03-01 DIAGNOSIS — E7800 Pure hypercholesterolemia, unspecified: Secondary | ICD-10-CM | POA: Diagnosis not present

## 2020-03-01 DIAGNOSIS — L97412 Non-pressure chronic ulcer of right heel and midfoot with fat layer exposed: Secondary | ICD-10-CM | POA: Diagnosis not present

## 2020-03-01 DIAGNOSIS — L97522 Non-pressure chronic ulcer of other part of left foot with fat layer exposed: Secondary | ICD-10-CM | POA: Diagnosis not present

## 2020-03-01 DIAGNOSIS — Z79891 Long term (current) use of opiate analgesic: Secondary | ICD-10-CM | POA: Diagnosis not present

## 2020-03-01 DIAGNOSIS — Z9181 History of falling: Secondary | ICD-10-CM | POA: Diagnosis not present

## 2020-03-01 DIAGNOSIS — E559 Vitamin D deficiency, unspecified: Secondary | ICD-10-CM | POA: Diagnosis not present

## 2020-03-01 DIAGNOSIS — E11621 Type 2 diabetes mellitus with foot ulcer: Secondary | ICD-10-CM | POA: Diagnosis not present

## 2020-03-01 DIAGNOSIS — Z794 Long term (current) use of insulin: Secondary | ICD-10-CM | POA: Diagnosis not present

## 2020-03-01 DIAGNOSIS — E612 Magnesium deficiency: Secondary | ICD-10-CM | POA: Diagnosis not present

## 2020-03-01 DIAGNOSIS — D649 Anemia, unspecified: Secondary | ICD-10-CM | POA: Diagnosis not present

## 2020-03-01 DIAGNOSIS — E1165 Type 2 diabetes mellitus with hyperglycemia: Secondary | ICD-10-CM | POA: Diagnosis not present

## 2020-03-01 DIAGNOSIS — I10 Essential (primary) hypertension: Secondary | ICD-10-CM | POA: Diagnosis not present

## 2020-03-01 DIAGNOSIS — Z9981 Dependence on supplemental oxygen: Secondary | ICD-10-CM | POA: Diagnosis not present

## 2020-03-01 DIAGNOSIS — K219 Gastro-esophageal reflux disease without esophagitis: Secondary | ICD-10-CM | POA: Diagnosis not present

## 2020-03-01 DIAGNOSIS — E1142 Type 2 diabetes mellitus with diabetic polyneuropathy: Secondary | ICD-10-CM | POA: Diagnosis not present

## 2020-03-01 DIAGNOSIS — J449 Chronic obstructive pulmonary disease, unspecified: Secondary | ICD-10-CM | POA: Diagnosis not present

## 2020-03-06 DIAGNOSIS — Z9981 Dependence on supplemental oxygen: Secondary | ICD-10-CM | POA: Diagnosis not present

## 2020-03-06 DIAGNOSIS — E7800 Pure hypercholesterolemia, unspecified: Secondary | ICD-10-CM | POA: Diagnosis not present

## 2020-03-06 DIAGNOSIS — E1142 Type 2 diabetes mellitus with diabetic polyneuropathy: Secondary | ICD-10-CM | POA: Diagnosis not present

## 2020-03-06 DIAGNOSIS — I10 Essential (primary) hypertension: Secondary | ICD-10-CM | POA: Diagnosis not present

## 2020-03-06 DIAGNOSIS — J449 Chronic obstructive pulmonary disease, unspecified: Secondary | ICD-10-CM | POA: Diagnosis not present

## 2020-03-06 DIAGNOSIS — Z79891 Long term (current) use of opiate analgesic: Secondary | ICD-10-CM | POA: Diagnosis not present

## 2020-03-06 DIAGNOSIS — E11621 Type 2 diabetes mellitus with foot ulcer: Secondary | ICD-10-CM | POA: Diagnosis not present

## 2020-03-06 DIAGNOSIS — E1165 Type 2 diabetes mellitus with hyperglycemia: Secondary | ICD-10-CM | POA: Diagnosis not present

## 2020-03-06 DIAGNOSIS — D649 Anemia, unspecified: Secondary | ICD-10-CM | POA: Diagnosis not present

## 2020-03-06 DIAGNOSIS — E612 Magnesium deficiency: Secondary | ICD-10-CM | POA: Diagnosis not present

## 2020-03-06 DIAGNOSIS — Z9181 History of falling: Secondary | ICD-10-CM | POA: Diagnosis not present

## 2020-03-06 DIAGNOSIS — Z794 Long term (current) use of insulin: Secondary | ICD-10-CM | POA: Diagnosis not present

## 2020-03-06 DIAGNOSIS — L97412 Non-pressure chronic ulcer of right heel and midfoot with fat layer exposed: Secondary | ICD-10-CM | POA: Diagnosis not present

## 2020-03-06 DIAGNOSIS — E559 Vitamin D deficiency, unspecified: Secondary | ICD-10-CM | POA: Diagnosis not present

## 2020-03-06 DIAGNOSIS — K219 Gastro-esophageal reflux disease without esophagitis: Secondary | ICD-10-CM | POA: Diagnosis not present

## 2020-03-08 DIAGNOSIS — Z9181 History of falling: Secondary | ICD-10-CM | POA: Diagnosis not present

## 2020-03-08 DIAGNOSIS — E7800 Pure hypercholesterolemia, unspecified: Secondary | ICD-10-CM | POA: Diagnosis not present

## 2020-03-08 DIAGNOSIS — Z9981 Dependence on supplemental oxygen: Secondary | ICD-10-CM | POA: Diagnosis not present

## 2020-03-08 DIAGNOSIS — E11621 Type 2 diabetes mellitus with foot ulcer: Secondary | ICD-10-CM | POA: Diagnosis not present

## 2020-03-08 DIAGNOSIS — Z794 Long term (current) use of insulin: Secondary | ICD-10-CM | POA: Diagnosis not present

## 2020-03-08 DIAGNOSIS — E1165 Type 2 diabetes mellitus with hyperglycemia: Secondary | ICD-10-CM | POA: Diagnosis not present

## 2020-03-08 DIAGNOSIS — E1142 Type 2 diabetes mellitus with diabetic polyneuropathy: Secondary | ICD-10-CM | POA: Diagnosis not present

## 2020-03-08 DIAGNOSIS — L97412 Non-pressure chronic ulcer of right heel and midfoot with fat layer exposed: Secondary | ICD-10-CM | POA: Diagnosis not present

## 2020-03-08 DIAGNOSIS — D649 Anemia, unspecified: Secondary | ICD-10-CM | POA: Diagnosis not present

## 2020-03-08 DIAGNOSIS — E559 Vitamin D deficiency, unspecified: Secondary | ICD-10-CM | POA: Diagnosis not present

## 2020-03-08 DIAGNOSIS — K219 Gastro-esophageal reflux disease without esophagitis: Secondary | ICD-10-CM | POA: Diagnosis not present

## 2020-03-08 DIAGNOSIS — Z79891 Long term (current) use of opiate analgesic: Secondary | ICD-10-CM | POA: Diagnosis not present

## 2020-03-08 DIAGNOSIS — E612 Magnesium deficiency: Secondary | ICD-10-CM | POA: Diagnosis not present

## 2020-03-08 DIAGNOSIS — J449 Chronic obstructive pulmonary disease, unspecified: Secondary | ICD-10-CM | POA: Diagnosis not present

## 2020-03-08 DIAGNOSIS — I10 Essential (primary) hypertension: Secondary | ICD-10-CM | POA: Diagnosis not present

## 2020-03-11 DIAGNOSIS — E11621 Type 2 diabetes mellitus with foot ulcer: Secondary | ICD-10-CM | POA: Diagnosis not present

## 2020-03-11 DIAGNOSIS — L97512 Non-pressure chronic ulcer of other part of right foot with fat layer exposed: Secondary | ICD-10-CM | POA: Diagnosis not present

## 2020-03-11 DIAGNOSIS — I10 Essential (primary) hypertension: Secondary | ICD-10-CM | POA: Diagnosis not present

## 2020-03-11 DIAGNOSIS — L97412 Non-pressure chronic ulcer of right heel and midfoot with fat layer exposed: Secondary | ICD-10-CM | POA: Diagnosis not present

## 2020-03-13 DIAGNOSIS — M1711 Unilateral primary osteoarthritis, right knee: Secondary | ICD-10-CM | POA: Diagnosis not present

## 2020-03-14 DIAGNOSIS — L97412 Non-pressure chronic ulcer of right heel and midfoot with fat layer exposed: Secondary | ICD-10-CM | POA: Diagnosis not present

## 2020-03-14 DIAGNOSIS — E559 Vitamin D deficiency, unspecified: Secondary | ICD-10-CM | POA: Diagnosis not present

## 2020-03-14 DIAGNOSIS — Z794 Long term (current) use of insulin: Secondary | ICD-10-CM | POA: Diagnosis not present

## 2020-03-14 DIAGNOSIS — E7800 Pure hypercholesterolemia, unspecified: Secondary | ICD-10-CM | POA: Diagnosis not present

## 2020-03-14 DIAGNOSIS — Z9981 Dependence on supplemental oxygen: Secondary | ICD-10-CM | POA: Diagnosis not present

## 2020-03-14 DIAGNOSIS — J449 Chronic obstructive pulmonary disease, unspecified: Secondary | ICD-10-CM | POA: Diagnosis not present

## 2020-03-14 DIAGNOSIS — E11621 Type 2 diabetes mellitus with foot ulcer: Secondary | ICD-10-CM | POA: Diagnosis not present

## 2020-03-14 DIAGNOSIS — Z9181 History of falling: Secondary | ICD-10-CM | POA: Diagnosis not present

## 2020-03-14 DIAGNOSIS — D649 Anemia, unspecified: Secondary | ICD-10-CM | POA: Diagnosis not present

## 2020-03-14 DIAGNOSIS — E1165 Type 2 diabetes mellitus with hyperglycemia: Secondary | ICD-10-CM | POA: Diagnosis not present

## 2020-03-14 DIAGNOSIS — K219 Gastro-esophageal reflux disease without esophagitis: Secondary | ICD-10-CM | POA: Diagnosis not present

## 2020-03-14 DIAGNOSIS — E1142 Type 2 diabetes mellitus with diabetic polyneuropathy: Secondary | ICD-10-CM | POA: Diagnosis not present

## 2020-03-14 DIAGNOSIS — Z79891 Long term (current) use of opiate analgesic: Secondary | ICD-10-CM | POA: Diagnosis not present

## 2020-03-14 DIAGNOSIS — E612 Magnesium deficiency: Secondary | ICD-10-CM | POA: Diagnosis not present

## 2020-03-14 DIAGNOSIS — I10 Essential (primary) hypertension: Secondary | ICD-10-CM | POA: Diagnosis not present

## 2020-03-18 DIAGNOSIS — L97512 Non-pressure chronic ulcer of other part of right foot with fat layer exposed: Secondary | ICD-10-CM | POA: Diagnosis not present

## 2020-03-18 DIAGNOSIS — E11621 Type 2 diabetes mellitus with foot ulcer: Secondary | ICD-10-CM | POA: Diagnosis not present

## 2020-03-18 DIAGNOSIS — I10 Essential (primary) hypertension: Secondary | ICD-10-CM | POA: Diagnosis not present

## 2020-03-18 DIAGNOSIS — L97412 Non-pressure chronic ulcer of right heel and midfoot with fat layer exposed: Secondary | ICD-10-CM | POA: Diagnosis not present

## 2020-03-21 DIAGNOSIS — E11621 Type 2 diabetes mellitus with foot ulcer: Secondary | ICD-10-CM | POA: Diagnosis not present

## 2020-03-21 DIAGNOSIS — E612 Magnesium deficiency: Secondary | ICD-10-CM | POA: Diagnosis not present

## 2020-03-21 DIAGNOSIS — E7800 Pure hypercholesterolemia, unspecified: Secondary | ICD-10-CM | POA: Diagnosis not present

## 2020-03-21 DIAGNOSIS — E1165 Type 2 diabetes mellitus with hyperglycemia: Secondary | ICD-10-CM | POA: Diagnosis not present

## 2020-03-21 DIAGNOSIS — J449 Chronic obstructive pulmonary disease, unspecified: Secondary | ICD-10-CM | POA: Diagnosis not present

## 2020-03-21 DIAGNOSIS — D649 Anemia, unspecified: Secondary | ICD-10-CM | POA: Diagnosis not present

## 2020-03-21 DIAGNOSIS — Z9981 Dependence on supplemental oxygen: Secondary | ICD-10-CM | POA: Diagnosis not present

## 2020-03-21 DIAGNOSIS — Z79891 Long term (current) use of opiate analgesic: Secondary | ICD-10-CM | POA: Diagnosis not present

## 2020-03-21 DIAGNOSIS — E1142 Type 2 diabetes mellitus with diabetic polyneuropathy: Secondary | ICD-10-CM | POA: Diagnosis not present

## 2020-03-21 DIAGNOSIS — Z9181 History of falling: Secondary | ICD-10-CM | POA: Diagnosis not present

## 2020-03-21 DIAGNOSIS — K219 Gastro-esophageal reflux disease without esophagitis: Secondary | ICD-10-CM | POA: Diagnosis not present

## 2020-03-21 DIAGNOSIS — L97412 Non-pressure chronic ulcer of right heel and midfoot with fat layer exposed: Secondary | ICD-10-CM | POA: Diagnosis not present

## 2020-03-21 DIAGNOSIS — I10 Essential (primary) hypertension: Secondary | ICD-10-CM | POA: Diagnosis not present

## 2020-03-21 DIAGNOSIS — Z794 Long term (current) use of insulin: Secondary | ICD-10-CM | POA: Diagnosis not present

## 2020-03-21 DIAGNOSIS — E559 Vitamin D deficiency, unspecified: Secondary | ICD-10-CM | POA: Diagnosis not present

## 2020-03-22 DIAGNOSIS — J449 Chronic obstructive pulmonary disease, unspecified: Secondary | ICD-10-CM | POA: Diagnosis not present

## 2020-03-25 DIAGNOSIS — I10 Essential (primary) hypertension: Secondary | ICD-10-CM | POA: Diagnosis not present

## 2020-03-25 DIAGNOSIS — L97519 Non-pressure chronic ulcer of other part of right foot with unspecified severity: Secondary | ICD-10-CM | POA: Diagnosis not present

## 2020-03-25 DIAGNOSIS — E11621 Type 2 diabetes mellitus with foot ulcer: Secondary | ICD-10-CM | POA: Diagnosis not present

## 2020-03-25 DIAGNOSIS — L97412 Non-pressure chronic ulcer of right heel and midfoot with fat layer exposed: Secondary | ICD-10-CM | POA: Diagnosis not present

## 2020-03-25 DIAGNOSIS — L97419 Non-pressure chronic ulcer of right heel and midfoot with unspecified severity: Secondary | ICD-10-CM | POA: Diagnosis not present

## 2020-03-25 DIAGNOSIS — L97512 Non-pressure chronic ulcer of other part of right foot with fat layer exposed: Secondary | ICD-10-CM | POA: Diagnosis not present

## 2020-03-26 DIAGNOSIS — D649 Anemia, unspecified: Secondary | ICD-10-CM | POA: Diagnosis not present

## 2020-03-26 DIAGNOSIS — E612 Magnesium deficiency: Secondary | ICD-10-CM | POA: Diagnosis not present

## 2020-03-26 DIAGNOSIS — J449 Chronic obstructive pulmonary disease, unspecified: Secondary | ICD-10-CM | POA: Diagnosis not present

## 2020-03-26 DIAGNOSIS — E7800 Pure hypercholesterolemia, unspecified: Secondary | ICD-10-CM | POA: Diagnosis not present

## 2020-03-26 DIAGNOSIS — E11621 Type 2 diabetes mellitus with foot ulcer: Secondary | ICD-10-CM | POA: Diagnosis not present

## 2020-03-26 DIAGNOSIS — Z794 Long term (current) use of insulin: Secondary | ICD-10-CM | POA: Diagnosis not present

## 2020-03-26 DIAGNOSIS — K219 Gastro-esophageal reflux disease without esophagitis: Secondary | ICD-10-CM | POA: Diagnosis not present

## 2020-03-26 DIAGNOSIS — I10 Essential (primary) hypertension: Secondary | ICD-10-CM | POA: Diagnosis not present

## 2020-03-26 DIAGNOSIS — L97412 Non-pressure chronic ulcer of right heel and midfoot with fat layer exposed: Secondary | ICD-10-CM | POA: Diagnosis not present

## 2020-03-26 DIAGNOSIS — E1142 Type 2 diabetes mellitus with diabetic polyneuropathy: Secondary | ICD-10-CM | POA: Diagnosis not present

## 2020-03-26 DIAGNOSIS — E1165 Type 2 diabetes mellitus with hyperglycemia: Secondary | ICD-10-CM | POA: Diagnosis not present

## 2020-03-26 DIAGNOSIS — Z9981 Dependence on supplemental oxygen: Secondary | ICD-10-CM | POA: Diagnosis not present

## 2020-03-26 DIAGNOSIS — Z79891 Long term (current) use of opiate analgesic: Secondary | ICD-10-CM | POA: Diagnosis not present

## 2020-03-26 DIAGNOSIS — Z9181 History of falling: Secondary | ICD-10-CM | POA: Diagnosis not present

## 2020-03-26 DIAGNOSIS — E559 Vitamin D deficiency, unspecified: Secondary | ICD-10-CM | POA: Diagnosis not present

## 2020-03-29 DIAGNOSIS — J449 Chronic obstructive pulmonary disease, unspecified: Secondary | ICD-10-CM | POA: Diagnosis not present

## 2020-03-29 DIAGNOSIS — M25461 Effusion, right knee: Secondary | ICD-10-CM | POA: Diagnosis not present

## 2020-03-29 DIAGNOSIS — M25561 Pain in right knee: Secondary | ICD-10-CM | POA: Diagnosis not present

## 2020-03-29 DIAGNOSIS — E559 Vitamin D deficiency, unspecified: Secondary | ICD-10-CM | POA: Diagnosis not present

## 2020-03-29 DIAGNOSIS — E7800 Pure hypercholesterolemia, unspecified: Secondary | ICD-10-CM | POA: Diagnosis not present

## 2020-03-29 DIAGNOSIS — K219 Gastro-esophageal reflux disease without esophagitis: Secondary | ICD-10-CM | POA: Diagnosis not present

## 2020-03-29 DIAGNOSIS — Z9981 Dependence on supplemental oxygen: Secondary | ICD-10-CM | POA: Diagnosis not present

## 2020-03-29 DIAGNOSIS — Z79891 Long term (current) use of opiate analgesic: Secondary | ICD-10-CM | POA: Diagnosis not present

## 2020-03-29 DIAGNOSIS — M47816 Spondylosis without myelopathy or radiculopathy, lumbar region: Secondary | ICD-10-CM | POA: Diagnosis not present

## 2020-03-29 DIAGNOSIS — I10 Essential (primary) hypertension: Secondary | ICD-10-CM | POA: Diagnosis not present

## 2020-03-29 DIAGNOSIS — L97412 Non-pressure chronic ulcer of right heel and midfoot with fat layer exposed: Secondary | ICD-10-CM | POA: Diagnosis not present

## 2020-03-29 DIAGNOSIS — E1142 Type 2 diabetes mellitus with diabetic polyneuropathy: Secondary | ICD-10-CM | POA: Diagnosis not present

## 2020-03-29 DIAGNOSIS — Z9181 History of falling: Secondary | ICD-10-CM | POA: Diagnosis not present

## 2020-03-29 DIAGNOSIS — E1165 Type 2 diabetes mellitus with hyperglycemia: Secondary | ICD-10-CM | POA: Diagnosis not present

## 2020-03-29 DIAGNOSIS — D649 Anemia, unspecified: Secondary | ICD-10-CM | POA: Diagnosis not present

## 2020-03-29 DIAGNOSIS — M1711 Unilateral primary osteoarthritis, right knee: Secondary | ICD-10-CM | POA: Diagnosis not present

## 2020-03-29 DIAGNOSIS — E11621 Type 2 diabetes mellitus with foot ulcer: Secondary | ICD-10-CM | POA: Diagnosis not present

## 2020-03-29 DIAGNOSIS — Z794 Long term (current) use of insulin: Secondary | ICD-10-CM | POA: Diagnosis not present

## 2020-03-29 DIAGNOSIS — E612 Magnesium deficiency: Secondary | ICD-10-CM | POA: Diagnosis not present

## 2020-03-29 DIAGNOSIS — G894 Chronic pain syndrome: Secondary | ICD-10-CM | POA: Diagnosis not present

## 2020-03-29 DIAGNOSIS — M1712 Unilateral primary osteoarthritis, left knee: Secondary | ICD-10-CM | POA: Diagnosis not present

## 2020-04-01 DIAGNOSIS — E612 Magnesium deficiency: Secondary | ICD-10-CM | POA: Diagnosis not present

## 2020-04-01 DIAGNOSIS — E1165 Type 2 diabetes mellitus with hyperglycemia: Secondary | ICD-10-CM | POA: Diagnosis not present

## 2020-04-01 DIAGNOSIS — E1140 Type 2 diabetes mellitus with diabetic neuropathy, unspecified: Secondary | ICD-10-CM | POA: Diagnosis not present

## 2020-04-01 DIAGNOSIS — E559 Vitamin D deficiency, unspecified: Secondary | ICD-10-CM | POA: Diagnosis not present

## 2020-04-01 DIAGNOSIS — J449 Chronic obstructive pulmonary disease, unspecified: Secondary | ICD-10-CM | POA: Diagnosis not present

## 2020-04-01 DIAGNOSIS — I10 Essential (primary) hypertension: Secondary | ICD-10-CM | POA: Diagnosis not present

## 2020-04-01 DIAGNOSIS — E7800 Pure hypercholesterolemia, unspecified: Secondary | ICD-10-CM | POA: Diagnosis not present

## 2020-04-01 DIAGNOSIS — L03115 Cellulitis of right lower limb: Secondary | ICD-10-CM | POA: Diagnosis not present

## 2020-04-04 DIAGNOSIS — E11621 Type 2 diabetes mellitus with foot ulcer: Secondary | ICD-10-CM | POA: Diagnosis not present

## 2020-04-04 DIAGNOSIS — L97412 Non-pressure chronic ulcer of right heel and midfoot with fat layer exposed: Secondary | ICD-10-CM | POA: Diagnosis not present

## 2020-04-04 DIAGNOSIS — J449 Chronic obstructive pulmonary disease, unspecified: Secondary | ICD-10-CM | POA: Diagnosis not present

## 2020-04-04 DIAGNOSIS — Z9181 History of falling: Secondary | ICD-10-CM | POA: Diagnosis not present

## 2020-04-04 DIAGNOSIS — Z9981 Dependence on supplemental oxygen: Secondary | ICD-10-CM | POA: Diagnosis not present

## 2020-04-04 DIAGNOSIS — K219 Gastro-esophageal reflux disease without esophagitis: Secondary | ICD-10-CM | POA: Diagnosis not present

## 2020-04-04 DIAGNOSIS — D649 Anemia, unspecified: Secondary | ICD-10-CM | POA: Diagnosis not present

## 2020-04-04 DIAGNOSIS — E1165 Type 2 diabetes mellitus with hyperglycemia: Secondary | ICD-10-CM | POA: Diagnosis not present

## 2020-04-04 DIAGNOSIS — E612 Magnesium deficiency: Secondary | ICD-10-CM | POA: Diagnosis not present

## 2020-04-04 DIAGNOSIS — E7800 Pure hypercholesterolemia, unspecified: Secondary | ICD-10-CM | POA: Diagnosis not present

## 2020-04-04 DIAGNOSIS — Z794 Long term (current) use of insulin: Secondary | ICD-10-CM | POA: Diagnosis not present

## 2020-04-04 DIAGNOSIS — Z79891 Long term (current) use of opiate analgesic: Secondary | ICD-10-CM | POA: Diagnosis not present

## 2020-04-04 DIAGNOSIS — E1142 Type 2 diabetes mellitus with diabetic polyneuropathy: Secondary | ICD-10-CM | POA: Diagnosis not present

## 2020-04-04 DIAGNOSIS — E559 Vitamin D deficiency, unspecified: Secondary | ICD-10-CM | POA: Diagnosis not present

## 2020-04-04 DIAGNOSIS — I10 Essential (primary) hypertension: Secondary | ICD-10-CM | POA: Diagnosis not present

## 2020-04-05 DIAGNOSIS — E7800 Pure hypercholesterolemia, unspecified: Secondary | ICD-10-CM | POA: Diagnosis not present

## 2020-04-05 DIAGNOSIS — E559 Vitamin D deficiency, unspecified: Secondary | ICD-10-CM | POA: Diagnosis not present

## 2020-04-05 DIAGNOSIS — E1142 Type 2 diabetes mellitus with diabetic polyneuropathy: Secondary | ICD-10-CM | POA: Diagnosis not present

## 2020-04-05 DIAGNOSIS — J449 Chronic obstructive pulmonary disease, unspecified: Secondary | ICD-10-CM | POA: Diagnosis not present

## 2020-04-05 DIAGNOSIS — Z9981 Dependence on supplemental oxygen: Secondary | ICD-10-CM | POA: Diagnosis not present

## 2020-04-05 DIAGNOSIS — E1165 Type 2 diabetes mellitus with hyperglycemia: Secondary | ICD-10-CM | POA: Diagnosis not present

## 2020-04-05 DIAGNOSIS — E11621 Type 2 diabetes mellitus with foot ulcer: Secondary | ICD-10-CM | POA: Diagnosis not present

## 2020-04-05 DIAGNOSIS — K219 Gastro-esophageal reflux disease without esophagitis: Secondary | ICD-10-CM | POA: Diagnosis not present

## 2020-04-05 DIAGNOSIS — L97412 Non-pressure chronic ulcer of right heel and midfoot with fat layer exposed: Secondary | ICD-10-CM | POA: Diagnosis not present

## 2020-04-05 DIAGNOSIS — Z9181 History of falling: Secondary | ICD-10-CM | POA: Diagnosis not present

## 2020-04-05 DIAGNOSIS — Z794 Long term (current) use of insulin: Secondary | ICD-10-CM | POA: Diagnosis not present

## 2020-04-05 DIAGNOSIS — Z79891 Long term (current) use of opiate analgesic: Secondary | ICD-10-CM | POA: Diagnosis not present

## 2020-04-05 DIAGNOSIS — D649 Anemia, unspecified: Secondary | ICD-10-CM | POA: Diagnosis not present

## 2020-04-05 DIAGNOSIS — E612 Magnesium deficiency: Secondary | ICD-10-CM | POA: Diagnosis not present

## 2020-04-05 DIAGNOSIS — I10 Essential (primary) hypertension: Secondary | ICD-10-CM | POA: Diagnosis not present

## 2020-04-08 DIAGNOSIS — I10 Essential (primary) hypertension: Secondary | ICD-10-CM | POA: Diagnosis not present

## 2020-04-08 DIAGNOSIS — L97412 Non-pressure chronic ulcer of right heel and midfoot with fat layer exposed: Secondary | ICD-10-CM | POA: Diagnosis not present

## 2020-04-08 DIAGNOSIS — Z79891 Long term (current) use of opiate analgesic: Secondary | ICD-10-CM | POA: Diagnosis not present

## 2020-04-08 DIAGNOSIS — E559 Vitamin D deficiency, unspecified: Secondary | ICD-10-CM | POA: Diagnosis not present

## 2020-04-08 DIAGNOSIS — E7800 Pure hypercholesterolemia, unspecified: Secondary | ICD-10-CM | POA: Diagnosis not present

## 2020-04-08 DIAGNOSIS — E11621 Type 2 diabetes mellitus with foot ulcer: Secondary | ICD-10-CM | POA: Diagnosis not present

## 2020-04-08 DIAGNOSIS — Z9981 Dependence on supplemental oxygen: Secondary | ICD-10-CM | POA: Diagnosis not present

## 2020-04-08 DIAGNOSIS — E1142 Type 2 diabetes mellitus with diabetic polyneuropathy: Secondary | ICD-10-CM | POA: Diagnosis not present

## 2020-04-08 DIAGNOSIS — Z794 Long term (current) use of insulin: Secondary | ICD-10-CM | POA: Diagnosis not present

## 2020-04-08 DIAGNOSIS — E1165 Type 2 diabetes mellitus with hyperglycemia: Secondary | ICD-10-CM | POA: Diagnosis not present

## 2020-04-08 DIAGNOSIS — E612 Magnesium deficiency: Secondary | ICD-10-CM | POA: Diagnosis not present

## 2020-04-08 DIAGNOSIS — D649 Anemia, unspecified: Secondary | ICD-10-CM | POA: Diagnosis not present

## 2020-04-08 DIAGNOSIS — Z9181 History of falling: Secondary | ICD-10-CM | POA: Diagnosis not present

## 2020-04-08 DIAGNOSIS — J449 Chronic obstructive pulmonary disease, unspecified: Secondary | ICD-10-CM | POA: Diagnosis not present

## 2020-04-08 DIAGNOSIS — K219 Gastro-esophageal reflux disease without esophagitis: Secondary | ICD-10-CM | POA: Diagnosis not present

## 2020-04-11 DIAGNOSIS — E612 Magnesium deficiency: Secondary | ICD-10-CM | POA: Diagnosis not present

## 2020-04-11 DIAGNOSIS — I10 Essential (primary) hypertension: Secondary | ICD-10-CM | POA: Diagnosis not present

## 2020-04-11 DIAGNOSIS — Z794 Long term (current) use of insulin: Secondary | ICD-10-CM | POA: Diagnosis not present

## 2020-04-11 DIAGNOSIS — E1165 Type 2 diabetes mellitus with hyperglycemia: Secondary | ICD-10-CM | POA: Diagnosis not present

## 2020-04-11 DIAGNOSIS — E7800 Pure hypercholesterolemia, unspecified: Secondary | ICD-10-CM | POA: Diagnosis not present

## 2020-04-11 DIAGNOSIS — Z79891 Long term (current) use of opiate analgesic: Secondary | ICD-10-CM | POA: Diagnosis not present

## 2020-04-11 DIAGNOSIS — Z9981 Dependence on supplemental oxygen: Secondary | ICD-10-CM | POA: Diagnosis not present

## 2020-04-11 DIAGNOSIS — K219 Gastro-esophageal reflux disease without esophagitis: Secondary | ICD-10-CM | POA: Diagnosis not present

## 2020-04-11 DIAGNOSIS — D649 Anemia, unspecified: Secondary | ICD-10-CM | POA: Diagnosis not present

## 2020-04-11 DIAGNOSIS — Z9181 History of falling: Secondary | ICD-10-CM | POA: Diagnosis not present

## 2020-04-11 DIAGNOSIS — E1142 Type 2 diabetes mellitus with diabetic polyneuropathy: Secondary | ICD-10-CM | POA: Diagnosis not present

## 2020-04-11 DIAGNOSIS — L97412 Non-pressure chronic ulcer of right heel and midfoot with fat layer exposed: Secondary | ICD-10-CM | POA: Diagnosis not present

## 2020-04-11 DIAGNOSIS — J449 Chronic obstructive pulmonary disease, unspecified: Secondary | ICD-10-CM | POA: Diagnosis not present

## 2020-04-11 DIAGNOSIS — E11621 Type 2 diabetes mellitus with foot ulcer: Secondary | ICD-10-CM | POA: Diagnosis not present

## 2020-04-11 DIAGNOSIS — E559 Vitamin D deficiency, unspecified: Secondary | ICD-10-CM | POA: Diagnosis not present

## 2020-04-15 DIAGNOSIS — E11621 Type 2 diabetes mellitus with foot ulcer: Secondary | ICD-10-CM | POA: Diagnosis not present

## 2020-04-15 DIAGNOSIS — L97412 Non-pressure chronic ulcer of right heel and midfoot with fat layer exposed: Secondary | ICD-10-CM | POA: Diagnosis not present

## 2020-04-15 DIAGNOSIS — E612 Magnesium deficiency: Secondary | ICD-10-CM | POA: Diagnosis not present

## 2020-04-15 DIAGNOSIS — I10 Essential (primary) hypertension: Secondary | ICD-10-CM | POA: Diagnosis not present

## 2020-04-15 DIAGNOSIS — K219 Gastro-esophageal reflux disease without esophagitis: Secondary | ICD-10-CM | POA: Diagnosis not present

## 2020-04-15 DIAGNOSIS — Z79891 Long term (current) use of opiate analgesic: Secondary | ICD-10-CM | POA: Diagnosis not present

## 2020-04-15 DIAGNOSIS — E1142 Type 2 diabetes mellitus with diabetic polyneuropathy: Secondary | ICD-10-CM | POA: Diagnosis not present

## 2020-04-15 DIAGNOSIS — D649 Anemia, unspecified: Secondary | ICD-10-CM | POA: Diagnosis not present

## 2020-04-15 DIAGNOSIS — E7800 Pure hypercholesterolemia, unspecified: Secondary | ICD-10-CM | POA: Diagnosis not present

## 2020-04-15 DIAGNOSIS — J449 Chronic obstructive pulmonary disease, unspecified: Secondary | ICD-10-CM | POA: Diagnosis not present

## 2020-04-15 DIAGNOSIS — Z9981 Dependence on supplemental oxygen: Secondary | ICD-10-CM | POA: Diagnosis not present

## 2020-04-15 DIAGNOSIS — Z9181 History of falling: Secondary | ICD-10-CM | POA: Diagnosis not present

## 2020-04-15 DIAGNOSIS — E559 Vitamin D deficiency, unspecified: Secondary | ICD-10-CM | POA: Diagnosis not present

## 2020-04-15 DIAGNOSIS — Z794 Long term (current) use of insulin: Secondary | ICD-10-CM | POA: Diagnosis not present

## 2020-04-15 DIAGNOSIS — E1165 Type 2 diabetes mellitus with hyperglycemia: Secondary | ICD-10-CM | POA: Diagnosis not present

## 2020-04-18 DIAGNOSIS — E559 Vitamin D deficiency, unspecified: Secondary | ICD-10-CM | POA: Diagnosis not present

## 2020-04-18 DIAGNOSIS — Z794 Long term (current) use of insulin: Secondary | ICD-10-CM | POA: Diagnosis not present

## 2020-04-18 DIAGNOSIS — E1142 Type 2 diabetes mellitus with diabetic polyneuropathy: Secondary | ICD-10-CM | POA: Diagnosis not present

## 2020-04-18 DIAGNOSIS — Z79891 Long term (current) use of opiate analgesic: Secondary | ICD-10-CM | POA: Diagnosis not present

## 2020-04-18 DIAGNOSIS — K219 Gastro-esophageal reflux disease without esophagitis: Secondary | ICD-10-CM | POA: Diagnosis not present

## 2020-04-18 DIAGNOSIS — Z9981 Dependence on supplemental oxygen: Secondary | ICD-10-CM | POA: Diagnosis not present

## 2020-04-18 DIAGNOSIS — I10 Essential (primary) hypertension: Secondary | ICD-10-CM | POA: Diagnosis not present

## 2020-04-18 DIAGNOSIS — E612 Magnesium deficiency: Secondary | ICD-10-CM | POA: Diagnosis not present

## 2020-04-18 DIAGNOSIS — E7800 Pure hypercholesterolemia, unspecified: Secondary | ICD-10-CM | POA: Diagnosis not present

## 2020-04-18 DIAGNOSIS — D649 Anemia, unspecified: Secondary | ICD-10-CM | POA: Diagnosis not present

## 2020-04-18 DIAGNOSIS — E11621 Type 2 diabetes mellitus with foot ulcer: Secondary | ICD-10-CM | POA: Diagnosis not present

## 2020-04-18 DIAGNOSIS — Z9181 History of falling: Secondary | ICD-10-CM | POA: Diagnosis not present

## 2020-04-18 DIAGNOSIS — J449 Chronic obstructive pulmonary disease, unspecified: Secondary | ICD-10-CM | POA: Diagnosis not present

## 2020-04-18 DIAGNOSIS — E1165 Type 2 diabetes mellitus with hyperglycemia: Secondary | ICD-10-CM | POA: Diagnosis not present

## 2020-04-18 DIAGNOSIS — L97412 Non-pressure chronic ulcer of right heel and midfoot with fat layer exposed: Secondary | ICD-10-CM | POA: Diagnosis not present

## 2020-04-21 DIAGNOSIS — J449 Chronic obstructive pulmonary disease, unspecified: Secondary | ICD-10-CM | POA: Diagnosis not present

## 2020-04-22 DIAGNOSIS — E612 Magnesium deficiency: Secondary | ICD-10-CM | POA: Diagnosis not present

## 2020-04-22 DIAGNOSIS — Z79891 Long term (current) use of opiate analgesic: Secondary | ICD-10-CM | POA: Diagnosis not present

## 2020-04-22 DIAGNOSIS — D649 Anemia, unspecified: Secondary | ICD-10-CM | POA: Diagnosis not present

## 2020-04-22 DIAGNOSIS — E7800 Pure hypercholesterolemia, unspecified: Secondary | ICD-10-CM | POA: Diagnosis not present

## 2020-04-22 DIAGNOSIS — E559 Vitamin D deficiency, unspecified: Secondary | ICD-10-CM | POA: Diagnosis not present

## 2020-04-22 DIAGNOSIS — L97412 Non-pressure chronic ulcer of right heel and midfoot with fat layer exposed: Secondary | ICD-10-CM | POA: Diagnosis not present

## 2020-04-22 DIAGNOSIS — E11621 Type 2 diabetes mellitus with foot ulcer: Secondary | ICD-10-CM | POA: Diagnosis not present

## 2020-04-22 DIAGNOSIS — Z9181 History of falling: Secondary | ICD-10-CM | POA: Diagnosis not present

## 2020-04-22 DIAGNOSIS — Z794 Long term (current) use of insulin: Secondary | ICD-10-CM | POA: Diagnosis not present

## 2020-04-22 DIAGNOSIS — E1142 Type 2 diabetes mellitus with diabetic polyneuropathy: Secondary | ICD-10-CM | POA: Diagnosis not present

## 2020-04-22 DIAGNOSIS — E1165 Type 2 diabetes mellitus with hyperglycemia: Secondary | ICD-10-CM | POA: Diagnosis not present

## 2020-04-22 DIAGNOSIS — J449 Chronic obstructive pulmonary disease, unspecified: Secondary | ICD-10-CM | POA: Diagnosis not present

## 2020-04-22 DIAGNOSIS — I10 Essential (primary) hypertension: Secondary | ICD-10-CM | POA: Diagnosis not present

## 2020-04-22 DIAGNOSIS — Z9981 Dependence on supplemental oxygen: Secondary | ICD-10-CM | POA: Diagnosis not present

## 2020-04-22 DIAGNOSIS — K219 Gastro-esophageal reflux disease without esophagitis: Secondary | ICD-10-CM | POA: Diagnosis not present

## 2020-04-24 DIAGNOSIS — M1710 Unilateral primary osteoarthritis, unspecified knee: Secondary | ICD-10-CM | POA: Diagnosis not present

## 2020-04-24 DIAGNOSIS — D649 Anemia, unspecified: Secondary | ICD-10-CM | POA: Diagnosis not present

## 2020-04-24 DIAGNOSIS — E1165 Type 2 diabetes mellitus with hyperglycemia: Secondary | ICD-10-CM | POA: Diagnosis not present

## 2020-04-24 DIAGNOSIS — Z794 Long term (current) use of insulin: Secondary | ICD-10-CM | POA: Diagnosis not present

## 2020-04-24 DIAGNOSIS — E559 Vitamin D deficiency, unspecified: Secondary | ICD-10-CM | POA: Diagnosis not present

## 2020-04-24 DIAGNOSIS — E11621 Type 2 diabetes mellitus with foot ulcer: Secondary | ICD-10-CM | POA: Diagnosis not present

## 2020-04-24 DIAGNOSIS — I10 Essential (primary) hypertension: Secondary | ICD-10-CM | POA: Diagnosis not present

## 2020-04-24 DIAGNOSIS — L97412 Non-pressure chronic ulcer of right heel and midfoot with fat layer exposed: Secondary | ICD-10-CM | POA: Diagnosis not present

## 2020-04-24 DIAGNOSIS — E7800 Pure hypercholesterolemia, unspecified: Secondary | ICD-10-CM | POA: Diagnosis not present

## 2020-04-24 DIAGNOSIS — Z9181 History of falling: Secondary | ICD-10-CM | POA: Diagnosis not present

## 2020-04-24 DIAGNOSIS — J449 Chronic obstructive pulmonary disease, unspecified: Secondary | ICD-10-CM | POA: Diagnosis not present

## 2020-04-24 DIAGNOSIS — E612 Magnesium deficiency: Secondary | ICD-10-CM | POA: Diagnosis not present

## 2020-04-24 DIAGNOSIS — K219 Gastro-esophageal reflux disease without esophagitis: Secondary | ICD-10-CM | POA: Diagnosis not present

## 2020-04-24 DIAGNOSIS — Z9981 Dependence on supplemental oxygen: Secondary | ICD-10-CM | POA: Diagnosis not present

## 2020-04-24 DIAGNOSIS — E1142 Type 2 diabetes mellitus with diabetic polyneuropathy: Secondary | ICD-10-CM | POA: Diagnosis not present

## 2020-04-24 DIAGNOSIS — Z79891 Long term (current) use of opiate analgesic: Secondary | ICD-10-CM | POA: Diagnosis not present

## 2020-04-25 DIAGNOSIS — E1142 Type 2 diabetes mellitus with diabetic polyneuropathy: Secondary | ICD-10-CM | POA: Diagnosis not present

## 2020-04-25 DIAGNOSIS — E7800 Pure hypercholesterolemia, unspecified: Secondary | ICD-10-CM | POA: Diagnosis not present

## 2020-04-25 DIAGNOSIS — I10 Essential (primary) hypertension: Secondary | ICD-10-CM | POA: Diagnosis not present

## 2020-04-25 DIAGNOSIS — L97412 Non-pressure chronic ulcer of right heel and midfoot with fat layer exposed: Secondary | ICD-10-CM | POA: Diagnosis not present

## 2020-04-25 DIAGNOSIS — E559 Vitamin D deficiency, unspecified: Secondary | ICD-10-CM | POA: Diagnosis not present

## 2020-04-25 DIAGNOSIS — Z79891 Long term (current) use of opiate analgesic: Secondary | ICD-10-CM | POA: Diagnosis not present

## 2020-04-25 DIAGNOSIS — E11621 Type 2 diabetes mellitus with foot ulcer: Secondary | ICD-10-CM | POA: Diagnosis not present

## 2020-04-25 DIAGNOSIS — Z9181 History of falling: Secondary | ICD-10-CM | POA: Diagnosis not present

## 2020-04-25 DIAGNOSIS — Z9981 Dependence on supplemental oxygen: Secondary | ICD-10-CM | POA: Diagnosis not present

## 2020-04-25 DIAGNOSIS — J449 Chronic obstructive pulmonary disease, unspecified: Secondary | ICD-10-CM | POA: Diagnosis not present

## 2020-04-25 DIAGNOSIS — K219 Gastro-esophageal reflux disease without esophagitis: Secondary | ICD-10-CM | POA: Diagnosis not present

## 2020-04-25 DIAGNOSIS — E612 Magnesium deficiency: Secondary | ICD-10-CM | POA: Diagnosis not present

## 2020-04-25 DIAGNOSIS — E1165 Type 2 diabetes mellitus with hyperglycemia: Secondary | ICD-10-CM | POA: Diagnosis not present

## 2020-04-25 DIAGNOSIS — Z794 Long term (current) use of insulin: Secondary | ICD-10-CM | POA: Diagnosis not present

## 2020-04-25 DIAGNOSIS — D649 Anemia, unspecified: Secondary | ICD-10-CM | POA: Diagnosis not present

## 2020-04-26 DIAGNOSIS — M170 Bilateral primary osteoarthritis of knee: Secondary | ICD-10-CM | POA: Diagnosis not present

## 2020-04-26 DIAGNOSIS — G894 Chronic pain syndrome: Secondary | ICD-10-CM | POA: Diagnosis not present

## 2020-04-26 DIAGNOSIS — Z79891 Long term (current) use of opiate analgesic: Secondary | ICD-10-CM | POA: Diagnosis not present

## 2020-04-26 DIAGNOSIS — Z1389 Encounter for screening for other disorder: Secondary | ICD-10-CM | POA: Diagnosis not present

## 2020-04-29 DIAGNOSIS — K219 Gastro-esophageal reflux disease without esophagitis: Secondary | ICD-10-CM | POA: Diagnosis not present

## 2020-04-29 DIAGNOSIS — Z9181 History of falling: Secondary | ICD-10-CM | POA: Diagnosis not present

## 2020-04-29 DIAGNOSIS — E11621 Type 2 diabetes mellitus with foot ulcer: Secondary | ICD-10-CM | POA: Diagnosis not present

## 2020-04-29 DIAGNOSIS — E1165 Type 2 diabetes mellitus with hyperglycemia: Secondary | ICD-10-CM | POA: Diagnosis not present

## 2020-04-29 DIAGNOSIS — J449 Chronic obstructive pulmonary disease, unspecified: Secondary | ICD-10-CM | POA: Diagnosis not present

## 2020-04-29 DIAGNOSIS — E612 Magnesium deficiency: Secondary | ICD-10-CM | POA: Diagnosis not present

## 2020-04-29 DIAGNOSIS — Z794 Long term (current) use of insulin: Secondary | ICD-10-CM | POA: Diagnosis not present

## 2020-04-29 DIAGNOSIS — Z9981 Dependence on supplemental oxygen: Secondary | ICD-10-CM | POA: Diagnosis not present

## 2020-04-29 DIAGNOSIS — E7800 Pure hypercholesterolemia, unspecified: Secondary | ICD-10-CM | POA: Diagnosis not present

## 2020-04-29 DIAGNOSIS — L97412 Non-pressure chronic ulcer of right heel and midfoot with fat layer exposed: Secondary | ICD-10-CM | POA: Diagnosis not present

## 2020-04-29 DIAGNOSIS — I10 Essential (primary) hypertension: Secondary | ICD-10-CM | POA: Diagnosis not present

## 2020-04-29 DIAGNOSIS — D649 Anemia, unspecified: Secondary | ICD-10-CM | POA: Diagnosis not present

## 2020-04-29 DIAGNOSIS — E1142 Type 2 diabetes mellitus with diabetic polyneuropathy: Secondary | ICD-10-CM | POA: Diagnosis not present

## 2020-04-29 DIAGNOSIS — E559 Vitamin D deficiency, unspecified: Secondary | ICD-10-CM | POA: Diagnosis not present

## 2020-04-29 DIAGNOSIS — Z79891 Long term (current) use of opiate analgesic: Secondary | ICD-10-CM | POA: Diagnosis not present

## 2020-05-02 DIAGNOSIS — D649 Anemia, unspecified: Secondary | ICD-10-CM | POA: Diagnosis not present

## 2020-05-02 DIAGNOSIS — L97412 Non-pressure chronic ulcer of right heel and midfoot with fat layer exposed: Secondary | ICD-10-CM | POA: Diagnosis not present

## 2020-05-02 DIAGNOSIS — Z794 Long term (current) use of insulin: Secondary | ICD-10-CM | POA: Diagnosis not present

## 2020-05-02 DIAGNOSIS — E11621 Type 2 diabetes mellitus with foot ulcer: Secondary | ICD-10-CM | POA: Diagnosis not present

## 2020-05-02 DIAGNOSIS — I10 Essential (primary) hypertension: Secondary | ICD-10-CM | POA: Diagnosis not present

## 2020-05-02 DIAGNOSIS — E612 Magnesium deficiency: Secondary | ICD-10-CM | POA: Diagnosis not present

## 2020-05-02 DIAGNOSIS — E559 Vitamin D deficiency, unspecified: Secondary | ICD-10-CM | POA: Diagnosis not present

## 2020-05-02 DIAGNOSIS — K219 Gastro-esophageal reflux disease without esophagitis: Secondary | ICD-10-CM | POA: Diagnosis not present

## 2020-05-02 DIAGNOSIS — E1142 Type 2 diabetes mellitus with diabetic polyneuropathy: Secondary | ICD-10-CM | POA: Diagnosis not present

## 2020-05-02 DIAGNOSIS — Z79891 Long term (current) use of opiate analgesic: Secondary | ICD-10-CM | POA: Diagnosis not present

## 2020-05-02 DIAGNOSIS — Z9181 History of falling: Secondary | ICD-10-CM | POA: Diagnosis not present

## 2020-05-02 DIAGNOSIS — E7800 Pure hypercholesterolemia, unspecified: Secondary | ICD-10-CM | POA: Diagnosis not present

## 2020-05-02 DIAGNOSIS — Z9981 Dependence on supplemental oxygen: Secondary | ICD-10-CM | POA: Diagnosis not present

## 2020-05-02 DIAGNOSIS — J449 Chronic obstructive pulmonary disease, unspecified: Secondary | ICD-10-CM | POA: Diagnosis not present

## 2020-05-02 DIAGNOSIS — E1165 Type 2 diabetes mellitus with hyperglycemia: Secondary | ICD-10-CM | POA: Diagnosis not present

## 2020-05-06 DIAGNOSIS — F17210 Nicotine dependence, cigarettes, uncomplicated: Secondary | ICD-10-CM | POA: Diagnosis not present

## 2020-05-06 DIAGNOSIS — Z89411 Acquired absence of right great toe: Secondary | ICD-10-CM | POA: Diagnosis not present

## 2020-05-06 DIAGNOSIS — Z4781 Encounter for orthopedic aftercare following surgical amputation: Secondary | ICD-10-CM | POA: Diagnosis not present

## 2020-05-06 DIAGNOSIS — D649 Anemia, unspecified: Secondary | ICD-10-CM | POA: Diagnosis not present

## 2020-05-06 DIAGNOSIS — K219 Gastro-esophageal reflux disease without esophagitis: Secondary | ICD-10-CM | POA: Diagnosis not present

## 2020-05-06 DIAGNOSIS — E7800 Pure hypercholesterolemia, unspecified: Secondary | ICD-10-CM | POA: Diagnosis not present

## 2020-05-06 DIAGNOSIS — E1165 Type 2 diabetes mellitus with hyperglycemia: Secondary | ICD-10-CM | POA: Diagnosis not present

## 2020-05-06 DIAGNOSIS — J449 Chronic obstructive pulmonary disease, unspecified: Secondary | ICD-10-CM | POA: Diagnosis not present

## 2020-05-06 DIAGNOSIS — E559 Vitamin D deficiency, unspecified: Secondary | ICD-10-CM | POA: Diagnosis not present

## 2020-05-06 DIAGNOSIS — Z79899 Other long term (current) drug therapy: Secondary | ICD-10-CM | POA: Diagnosis not present

## 2020-05-06 DIAGNOSIS — E612 Magnesium deficiency: Secondary | ICD-10-CM | POA: Diagnosis not present

## 2020-05-06 DIAGNOSIS — E1142 Type 2 diabetes mellitus with diabetic polyneuropathy: Secondary | ICD-10-CM | POA: Diagnosis not present

## 2020-05-06 DIAGNOSIS — I5031 Acute diastolic (congestive) heart failure: Secondary | ICD-10-CM | POA: Diagnosis not present

## 2020-05-06 DIAGNOSIS — I21A1 Myocardial infarction type 2: Secondary | ICD-10-CM | POA: Diagnosis not present

## 2020-05-06 DIAGNOSIS — L97412 Non-pressure chronic ulcer of right heel and midfoot with fat layer exposed: Secondary | ICD-10-CM | POA: Diagnosis not present

## 2020-05-06 DIAGNOSIS — E11621 Type 2 diabetes mellitus with foot ulcer: Secondary | ICD-10-CM | POA: Diagnosis not present

## 2020-05-06 DIAGNOSIS — Z79891 Long term (current) use of opiate analgesic: Secondary | ICD-10-CM | POA: Diagnosis not present

## 2020-05-06 DIAGNOSIS — I110 Hypertensive heart disease with heart failure: Secondary | ICD-10-CM | POA: Diagnosis not present

## 2020-05-06 DIAGNOSIS — Z9181 History of falling: Secondary | ICD-10-CM | POA: Diagnosis not present

## 2020-05-06 DIAGNOSIS — Z794 Long term (current) use of insulin: Secondary | ICD-10-CM | POA: Diagnosis not present

## 2020-05-06 DIAGNOSIS — Z9981 Dependence on supplemental oxygen: Secondary | ICD-10-CM | POA: Diagnosis not present

## 2020-05-09 DIAGNOSIS — Z89411 Acquired absence of right great toe: Secondary | ICD-10-CM | POA: Diagnosis not present

## 2020-05-09 DIAGNOSIS — I21A1 Myocardial infarction type 2: Secondary | ICD-10-CM | POA: Diagnosis not present

## 2020-05-09 DIAGNOSIS — K219 Gastro-esophageal reflux disease without esophagitis: Secondary | ICD-10-CM | POA: Diagnosis not present

## 2020-05-09 DIAGNOSIS — Z79899 Other long term (current) drug therapy: Secondary | ICD-10-CM | POA: Diagnosis not present

## 2020-05-09 DIAGNOSIS — J449 Chronic obstructive pulmonary disease, unspecified: Secondary | ICD-10-CM | POA: Diagnosis not present

## 2020-05-09 DIAGNOSIS — E11621 Type 2 diabetes mellitus with foot ulcer: Secondary | ICD-10-CM | POA: Diagnosis not present

## 2020-05-09 DIAGNOSIS — E559 Vitamin D deficiency, unspecified: Secondary | ICD-10-CM | POA: Diagnosis not present

## 2020-05-09 DIAGNOSIS — Z9981 Dependence on supplemental oxygen: Secondary | ICD-10-CM | POA: Diagnosis not present

## 2020-05-09 DIAGNOSIS — Z794 Long term (current) use of insulin: Secondary | ICD-10-CM | POA: Diagnosis not present

## 2020-05-09 DIAGNOSIS — Z4781 Encounter for orthopedic aftercare following surgical amputation: Secondary | ICD-10-CM | POA: Diagnosis not present

## 2020-05-09 DIAGNOSIS — D649 Anemia, unspecified: Secondary | ICD-10-CM | POA: Diagnosis not present

## 2020-05-09 DIAGNOSIS — Z79891 Long term (current) use of opiate analgesic: Secondary | ICD-10-CM | POA: Diagnosis not present

## 2020-05-09 DIAGNOSIS — I5031 Acute diastolic (congestive) heart failure: Secondary | ICD-10-CM | POA: Diagnosis not present

## 2020-05-09 DIAGNOSIS — L97412 Non-pressure chronic ulcer of right heel and midfoot with fat layer exposed: Secondary | ICD-10-CM | POA: Diagnosis not present

## 2020-05-09 DIAGNOSIS — E612 Magnesium deficiency: Secondary | ICD-10-CM | POA: Diagnosis not present

## 2020-05-09 DIAGNOSIS — E1142 Type 2 diabetes mellitus with diabetic polyneuropathy: Secondary | ICD-10-CM | POA: Diagnosis not present

## 2020-05-09 DIAGNOSIS — E7800 Pure hypercholesterolemia, unspecified: Secondary | ICD-10-CM | POA: Diagnosis not present

## 2020-05-09 DIAGNOSIS — F17210 Nicotine dependence, cigarettes, uncomplicated: Secondary | ICD-10-CM | POA: Diagnosis not present

## 2020-05-09 DIAGNOSIS — I110 Hypertensive heart disease with heart failure: Secondary | ICD-10-CM | POA: Diagnosis not present

## 2020-05-09 DIAGNOSIS — Z9181 History of falling: Secondary | ICD-10-CM | POA: Diagnosis not present

## 2020-05-09 DIAGNOSIS — E1165 Type 2 diabetes mellitus with hyperglycemia: Secondary | ICD-10-CM | POA: Diagnosis not present

## 2020-05-13 DIAGNOSIS — E1165 Type 2 diabetes mellitus with hyperglycemia: Secondary | ICD-10-CM | POA: Diagnosis not present

## 2020-05-13 DIAGNOSIS — E612 Magnesium deficiency: Secondary | ICD-10-CM | POA: Diagnosis not present

## 2020-05-13 DIAGNOSIS — I5031 Acute diastolic (congestive) heart failure: Secondary | ICD-10-CM | POA: Diagnosis not present

## 2020-05-13 DIAGNOSIS — J449 Chronic obstructive pulmonary disease, unspecified: Secondary | ICD-10-CM | POA: Diagnosis not present

## 2020-05-13 DIAGNOSIS — E1142 Type 2 diabetes mellitus with diabetic polyneuropathy: Secondary | ICD-10-CM | POA: Diagnosis not present

## 2020-05-13 DIAGNOSIS — Z79899 Other long term (current) drug therapy: Secondary | ICD-10-CM | POA: Diagnosis not present

## 2020-05-13 DIAGNOSIS — Z79891 Long term (current) use of opiate analgesic: Secondary | ICD-10-CM | POA: Diagnosis not present

## 2020-05-13 DIAGNOSIS — Z794 Long term (current) use of insulin: Secondary | ICD-10-CM | POA: Diagnosis not present

## 2020-05-13 DIAGNOSIS — Z4781 Encounter for orthopedic aftercare following surgical amputation: Secondary | ICD-10-CM | POA: Diagnosis not present

## 2020-05-13 DIAGNOSIS — I21A1 Myocardial infarction type 2: Secondary | ICD-10-CM | POA: Diagnosis not present

## 2020-05-13 DIAGNOSIS — E11621 Type 2 diabetes mellitus with foot ulcer: Secondary | ICD-10-CM | POA: Diagnosis not present

## 2020-05-13 DIAGNOSIS — E559 Vitamin D deficiency, unspecified: Secondary | ICD-10-CM | POA: Diagnosis not present

## 2020-05-13 DIAGNOSIS — F17210 Nicotine dependence, cigarettes, uncomplicated: Secondary | ICD-10-CM | POA: Diagnosis not present

## 2020-05-13 DIAGNOSIS — K219 Gastro-esophageal reflux disease without esophagitis: Secondary | ICD-10-CM | POA: Diagnosis not present

## 2020-05-13 DIAGNOSIS — L97412 Non-pressure chronic ulcer of right heel and midfoot with fat layer exposed: Secondary | ICD-10-CM | POA: Diagnosis not present

## 2020-05-13 DIAGNOSIS — Z9181 History of falling: Secondary | ICD-10-CM | POA: Diagnosis not present

## 2020-05-13 DIAGNOSIS — Z9981 Dependence on supplemental oxygen: Secondary | ICD-10-CM | POA: Diagnosis not present

## 2020-05-13 DIAGNOSIS — E7800 Pure hypercholesterolemia, unspecified: Secondary | ICD-10-CM | POA: Diagnosis not present

## 2020-05-13 DIAGNOSIS — D649 Anemia, unspecified: Secondary | ICD-10-CM | POA: Diagnosis not present

## 2020-05-13 DIAGNOSIS — I110 Hypertensive heart disease with heart failure: Secondary | ICD-10-CM | POA: Diagnosis not present

## 2020-05-13 DIAGNOSIS — Z89411 Acquired absence of right great toe: Secondary | ICD-10-CM | POA: Diagnosis not present

## 2020-05-15 DIAGNOSIS — Z9181 History of falling: Secondary | ICD-10-CM | POA: Diagnosis not present

## 2020-05-15 DIAGNOSIS — E785 Hyperlipidemia, unspecified: Secondary | ICD-10-CM | POA: Diagnosis not present

## 2020-05-15 DIAGNOSIS — Z0000 Encounter for general adult medical examination without abnormal findings: Secondary | ICD-10-CM | POA: Diagnosis not present

## 2020-05-16 DIAGNOSIS — Z4781 Encounter for orthopedic aftercare following surgical amputation: Secondary | ICD-10-CM | POA: Diagnosis not present

## 2020-05-16 DIAGNOSIS — Z825 Family history of asthma and other chronic lower respiratory diseases: Secondary | ICD-10-CM | POA: Diagnosis not present

## 2020-05-16 DIAGNOSIS — E785 Hyperlipidemia, unspecified: Secondary | ICD-10-CM | POA: Diagnosis not present

## 2020-05-16 DIAGNOSIS — R0902 Hypoxemia: Secondary | ICD-10-CM | POA: Diagnosis not present

## 2020-05-16 DIAGNOSIS — E612 Magnesium deficiency: Secondary | ICD-10-CM | POA: Diagnosis not present

## 2020-05-16 DIAGNOSIS — I21A1 Myocardial infarction type 2: Secondary | ICD-10-CM | POA: Diagnosis not present

## 2020-05-16 DIAGNOSIS — S91101A Unspecified open wound of right great toe without damage to nail, initial encounter: Secondary | ICD-10-CM | POA: Diagnosis not present

## 2020-05-16 DIAGNOSIS — I5031 Acute diastolic (congestive) heart failure: Secondary | ICD-10-CM | POA: Diagnosis not present

## 2020-05-16 DIAGNOSIS — R079 Chest pain, unspecified: Secondary | ICD-10-CM | POA: Diagnosis not present

## 2020-05-16 DIAGNOSIS — Z20822 Contact with and (suspected) exposure to covid-19: Secondary | ICD-10-CM | POA: Diagnosis not present

## 2020-05-16 DIAGNOSIS — F17210 Nicotine dependence, cigarettes, uncomplicated: Secondary | ICD-10-CM | POA: Diagnosis not present

## 2020-05-16 DIAGNOSIS — J9811 Atelectasis: Secondary | ICD-10-CM | POA: Diagnosis not present

## 2020-05-16 DIAGNOSIS — I509 Heart failure, unspecified: Secondary | ICD-10-CM | POA: Diagnosis not present

## 2020-05-16 DIAGNOSIS — I70235 Atherosclerosis of native arteries of right leg with ulceration of other part of foot: Secondary | ICD-10-CM | POA: Diagnosis not present

## 2020-05-16 DIAGNOSIS — S98111A Complete traumatic amputation of right great toe, initial encounter: Secondary | ICD-10-CM | POA: Diagnosis not present

## 2020-05-16 DIAGNOSIS — E1165 Type 2 diabetes mellitus with hyperglycemia: Secondary | ICD-10-CM | POA: Diagnosis not present

## 2020-05-16 DIAGNOSIS — J449 Chronic obstructive pulmonary disease, unspecified: Secondary | ICD-10-CM | POA: Diagnosis not present

## 2020-05-16 DIAGNOSIS — E11621 Type 2 diabetes mellitus with foot ulcer: Secondary | ICD-10-CM | POA: Diagnosis not present

## 2020-05-16 DIAGNOSIS — E440 Moderate protein-calorie malnutrition: Secondary | ICD-10-CM | POA: Diagnosis not present

## 2020-05-16 DIAGNOSIS — R0602 Shortness of breath: Secondary | ICD-10-CM | POA: Diagnosis not present

## 2020-05-16 DIAGNOSIS — J8489 Other specified interstitial pulmonary diseases: Secondary | ICD-10-CM | POA: Diagnosis not present

## 2020-05-16 DIAGNOSIS — I161 Hypertensive emergency: Secondary | ICD-10-CM | POA: Diagnosis not present

## 2020-05-16 DIAGNOSIS — I70291 Other atherosclerosis of native arteries of extremities, right leg: Secondary | ICD-10-CM | POA: Diagnosis not present

## 2020-05-16 DIAGNOSIS — I110 Hypertensive heart disease with heart failure: Secondary | ICD-10-CM | POA: Diagnosis not present

## 2020-05-16 DIAGNOSIS — E1169 Type 2 diabetes mellitus with other specified complication: Secondary | ICD-10-CM | POA: Diagnosis not present

## 2020-05-16 DIAGNOSIS — R0683 Snoring: Secondary | ICD-10-CM | POA: Diagnosis not present

## 2020-05-16 DIAGNOSIS — E11628 Type 2 diabetes mellitus with other skin complications: Secondary | ICD-10-CM | POA: Diagnosis not present

## 2020-05-16 DIAGNOSIS — L089 Local infection of the skin and subcutaneous tissue, unspecified: Secondary | ICD-10-CM | POA: Diagnosis not present

## 2020-05-16 DIAGNOSIS — Z9181 History of falling: Secondary | ICD-10-CM | POA: Diagnosis not present

## 2020-05-16 DIAGNOSIS — I10 Essential (primary) hypertension: Secondary | ICD-10-CM | POA: Diagnosis not present

## 2020-05-16 DIAGNOSIS — L98499 Non-pressure chronic ulcer of skin of other sites with unspecified severity: Secondary | ICD-10-CM | POA: Diagnosis not present

## 2020-05-16 DIAGNOSIS — L97412 Non-pressure chronic ulcer of right heel and midfoot with fat layer exposed: Secondary | ICD-10-CM | POA: Diagnosis not present

## 2020-05-16 DIAGNOSIS — I160 Hypertensive urgency: Secondary | ICD-10-CM | POA: Diagnosis not present

## 2020-05-16 DIAGNOSIS — Z79891 Long term (current) use of opiate analgesic: Secondary | ICD-10-CM | POA: Diagnosis not present

## 2020-05-16 DIAGNOSIS — M7731 Calcaneal spur, right foot: Secondary | ICD-10-CM | POA: Diagnosis not present

## 2020-05-16 DIAGNOSIS — I248 Other forms of acute ischemic heart disease: Secondary | ICD-10-CM | POA: Diagnosis not present

## 2020-05-16 DIAGNOSIS — D649 Anemia, unspecified: Secondary | ICD-10-CM | POA: Diagnosis not present

## 2020-05-16 DIAGNOSIS — Z8249 Family history of ischemic heart disease and other diseases of the circulatory system: Secondary | ICD-10-CM | POA: Diagnosis not present

## 2020-05-16 DIAGNOSIS — R072 Precordial pain: Secondary | ICD-10-CM | POA: Diagnosis not present

## 2020-05-16 DIAGNOSIS — E1142 Type 2 diabetes mellitus with diabetic polyneuropathy: Secondary | ICD-10-CM | POA: Diagnosis not present

## 2020-05-16 DIAGNOSIS — L97519 Non-pressure chronic ulcer of other part of right foot with unspecified severity: Secondary | ICD-10-CM | POA: Diagnosis not present

## 2020-05-16 DIAGNOSIS — Z794 Long term (current) use of insulin: Secondary | ICD-10-CM | POA: Diagnosis not present

## 2020-05-16 DIAGNOSIS — J9601 Acute respiratory failure with hypoxia: Secondary | ICD-10-CM | POA: Diagnosis not present

## 2020-05-16 DIAGNOSIS — E7800 Pure hypercholesterolemia, unspecified: Secondary | ICD-10-CM | POA: Diagnosis not present

## 2020-05-16 DIAGNOSIS — I214 Non-ST elevation (NSTEMI) myocardial infarction: Secondary | ICD-10-CM | POA: Diagnosis not present

## 2020-05-16 DIAGNOSIS — Z7951 Long term (current) use of inhaled steroids: Secondary | ICD-10-CM | POA: Diagnosis not present

## 2020-05-16 DIAGNOSIS — J9690 Respiratory failure, unspecified, unspecified whether with hypoxia or hypercapnia: Secondary | ICD-10-CM | POA: Diagnosis not present

## 2020-05-16 DIAGNOSIS — Z9981 Dependence on supplemental oxygen: Secondary | ICD-10-CM | POA: Diagnosis not present

## 2020-05-16 DIAGNOSIS — Z800 Family history of malignant neoplasm of digestive organs: Secondary | ICD-10-CM | POA: Diagnosis not present

## 2020-05-16 DIAGNOSIS — Z79899 Other long term (current) drug therapy: Secondary | ICD-10-CM | POA: Diagnosis not present

## 2020-05-16 DIAGNOSIS — E1140 Type 2 diabetes mellitus with diabetic neuropathy, unspecified: Secondary | ICD-10-CM | POA: Diagnosis not present

## 2020-05-16 DIAGNOSIS — I517 Cardiomegaly: Secondary | ICD-10-CM | POA: Diagnosis not present

## 2020-05-16 DIAGNOSIS — Z89411 Acquired absence of right great toe: Secondary | ICD-10-CM | POA: Diagnosis not present

## 2020-05-16 DIAGNOSIS — M86171 Other acute osteomyelitis, right ankle and foot: Secondary | ICD-10-CM | POA: Diagnosis not present

## 2020-05-16 DIAGNOSIS — I96 Gangrene, not elsewhere classified: Secondary | ICD-10-CM | POA: Diagnosis not present

## 2020-05-16 DIAGNOSIS — I700 Atherosclerosis of aorta: Secondary | ICD-10-CM | POA: Diagnosis not present

## 2020-05-16 DIAGNOSIS — J810 Acute pulmonary edema: Secondary | ICD-10-CM | POA: Diagnosis not present

## 2020-05-16 DIAGNOSIS — M869 Osteomyelitis, unspecified: Secondary | ICD-10-CM | POA: Diagnosis not present

## 2020-05-16 DIAGNOSIS — K219 Gastro-esophageal reflux disease without esophagitis: Secondary | ICD-10-CM | POA: Diagnosis not present

## 2020-05-16 DIAGNOSIS — Z452 Encounter for adjustment and management of vascular access device: Secondary | ICD-10-CM | POA: Diagnosis not present

## 2020-05-16 DIAGNOSIS — M868X7 Other osteomyelitis, ankle and foot: Secondary | ICD-10-CM | POA: Diagnosis not present

## 2020-05-16 DIAGNOSIS — Z833 Family history of diabetes mellitus: Secondary | ICD-10-CM | POA: Diagnosis not present

## 2020-05-16 DIAGNOSIS — E559 Vitamin D deficiency, unspecified: Secondary | ICD-10-CM | POA: Diagnosis not present

## 2020-05-16 DIAGNOSIS — I2510 Atherosclerotic heart disease of native coronary artery without angina pectoris: Secondary | ICD-10-CM | POA: Diagnosis not present

## 2020-05-16 DIAGNOSIS — J811 Chronic pulmonary edema: Secondary | ICD-10-CM | POA: Diagnosis not present

## 2020-05-16 DIAGNOSIS — R000 Tachycardia, unspecified: Secondary | ICD-10-CM | POA: Diagnosis not present

## 2020-05-27 ENCOUNTER — Encounter (HOSPITAL_COMMUNITY): Payer: Self-pay

## 2020-05-27 ENCOUNTER — Other Ambulatory Visit: Payer: Self-pay

## 2020-05-27 ENCOUNTER — Emergency Department (HOSPITAL_COMMUNITY): Payer: Medicare Other

## 2020-05-27 DIAGNOSIS — F418 Other specified anxiety disorders: Secondary | ICD-10-CM | POA: Diagnosis present

## 2020-05-27 DIAGNOSIS — I21A1 Myocardial infarction type 2: Secondary | ICD-10-CM | POA: Diagnosis not present

## 2020-05-27 DIAGNOSIS — S82892A Other fracture of left lower leg, initial encounter for closed fracture: Secondary | ICD-10-CM | POA: Diagnosis not present

## 2020-05-27 DIAGNOSIS — R6889 Other general symptoms and signs: Secondary | ICD-10-CM | POA: Diagnosis not present

## 2020-05-27 DIAGNOSIS — S82842A Displaced bimalleolar fracture of left lower leg, initial encounter for closed fracture: Secondary | ICD-10-CM | POA: Diagnosis not present

## 2020-05-27 DIAGNOSIS — Z20822 Contact with and (suspected) exposure to covid-19: Secondary | ICD-10-CM | POA: Diagnosis not present

## 2020-05-27 DIAGNOSIS — D649 Anemia, unspecified: Secondary | ICD-10-CM | POA: Diagnosis not present

## 2020-05-27 DIAGNOSIS — J449 Chronic obstructive pulmonary disease, unspecified: Secondary | ICD-10-CM | POA: Diagnosis not present

## 2020-05-27 DIAGNOSIS — E785 Hyperlipidemia, unspecified: Secondary | ICD-10-CM | POA: Diagnosis not present

## 2020-05-27 DIAGNOSIS — Z79899 Other long term (current) drug therapy: Secondary | ICD-10-CM

## 2020-05-27 DIAGNOSIS — M7989 Other specified soft tissue disorders: Secondary | ICD-10-CM | POA: Diagnosis not present

## 2020-05-27 DIAGNOSIS — S91204A Unspecified open wound of right lesser toe(s) with damage to nail, initial encounter: Secondary | ICD-10-CM | POA: Diagnosis not present

## 2020-05-27 DIAGNOSIS — M2041 Other hammer toe(s) (acquired), right foot: Secondary | ICD-10-CM | POA: Diagnosis not present

## 2020-05-27 DIAGNOSIS — R600 Localized edema: Secondary | ICD-10-CM | POA: Diagnosis not present

## 2020-05-27 DIAGNOSIS — Z8719 Personal history of other diseases of the digestive system: Secondary | ICD-10-CM | POA: Diagnosis not present

## 2020-05-27 DIAGNOSIS — I2510 Atherosclerotic heart disease of native coronary artery without angina pectoris: Secondary | ICD-10-CM | POA: Diagnosis present

## 2020-05-27 DIAGNOSIS — F17210 Nicotine dependence, cigarettes, uncomplicated: Secondary | ICD-10-CM | POA: Diagnosis present

## 2020-05-27 DIAGNOSIS — M545 Low back pain: Secondary | ICD-10-CM | POA: Diagnosis not present

## 2020-05-27 DIAGNOSIS — W19XXXA Unspecified fall, initial encounter: Secondary | ICD-10-CM | POA: Diagnosis not present

## 2020-05-27 DIAGNOSIS — R52 Pain, unspecified: Secondary | ICD-10-CM | POA: Diagnosis not present

## 2020-05-27 DIAGNOSIS — E7800 Pure hypercholesterolemia, unspecified: Secondary | ICD-10-CM | POA: Diagnosis not present

## 2020-05-27 DIAGNOSIS — E11621 Type 2 diabetes mellitus with foot ulcer: Secondary | ICD-10-CM | POA: Diagnosis not present

## 2020-05-27 DIAGNOSIS — Z9981 Dependence on supplemental oxygen: Secondary | ICD-10-CM | POA: Diagnosis not present

## 2020-05-27 DIAGNOSIS — L97412 Non-pressure chronic ulcer of right heel and midfoot with fat layer exposed: Secondary | ICD-10-CM | POA: Diagnosis not present

## 2020-05-27 DIAGNOSIS — Z881 Allergy status to other antibiotic agents status: Secondary | ICD-10-CM

## 2020-05-27 DIAGNOSIS — I110 Hypertensive heart disease with heart failure: Secondary | ICD-10-CM | POA: Diagnosis not present

## 2020-05-27 DIAGNOSIS — Z713 Dietary counseling and surveillance: Secondary | ICD-10-CM

## 2020-05-27 DIAGNOSIS — Z79891 Long term (current) use of opiate analgesic: Secondary | ICD-10-CM | POA: Diagnosis not present

## 2020-05-27 DIAGNOSIS — G894 Chronic pain syndrome: Secondary | ICD-10-CM | POA: Diagnosis not present

## 2020-05-27 DIAGNOSIS — E1142 Type 2 diabetes mellitus with diabetic polyneuropathy: Secondary | ICD-10-CM | POA: Diagnosis present

## 2020-05-27 DIAGNOSIS — T07XXXA Unspecified multiple injuries, initial encounter: Secondary | ICD-10-CM | POA: Diagnosis not present

## 2020-05-27 DIAGNOSIS — L97519 Non-pressure chronic ulcer of other part of right foot with unspecified severity: Secondary | ICD-10-CM | POA: Diagnosis present

## 2020-05-27 DIAGNOSIS — Z818 Family history of other mental and behavioral disorders: Secondary | ICD-10-CM

## 2020-05-27 DIAGNOSIS — B951 Streptococcus, group B, as the cause of diseases classified elsewhere: Secondary | ICD-10-CM | POA: Diagnosis not present

## 2020-05-27 DIAGNOSIS — R609 Edema, unspecified: Secondary | ICD-10-CM | POA: Diagnosis not present

## 2020-05-27 DIAGNOSIS — Z4781 Encounter for orthopedic aftercare following surgical amputation: Secondary | ICD-10-CM | POA: Diagnosis not present

## 2020-05-27 DIAGNOSIS — Z743 Need for continuous supervision: Secondary | ICD-10-CM | POA: Diagnosis not present

## 2020-05-27 DIAGNOSIS — K219 Gastro-esophageal reflux disease without esophagitis: Secondary | ICD-10-CM | POA: Diagnosis not present

## 2020-05-27 DIAGNOSIS — E08621 Diabetes mellitus due to underlying condition with foot ulcer: Secondary | ICD-10-CM | POA: Diagnosis not present

## 2020-05-27 DIAGNOSIS — Z794 Long term (current) use of insulin: Secondary | ICD-10-CM | POA: Diagnosis not present

## 2020-05-27 DIAGNOSIS — G8929 Other chronic pain: Secondary | ICD-10-CM | POA: Diagnosis not present

## 2020-05-27 DIAGNOSIS — Z7951 Long term (current) use of inhaled steroids: Secondary | ICD-10-CM

## 2020-05-27 DIAGNOSIS — I5032 Chronic diastolic (congestive) heart failure: Secondary | ICD-10-CM | POA: Diagnosis not present

## 2020-05-27 DIAGNOSIS — S82852A Displaced trimalleolar fracture of left lower leg, initial encounter for closed fracture: Secondary | ICD-10-CM | POA: Diagnosis not present

## 2020-05-27 DIAGNOSIS — M546 Pain in thoracic spine: Secondary | ICD-10-CM | POA: Diagnosis not present

## 2020-05-27 DIAGNOSIS — D62 Acute posthemorrhagic anemia: Secondary | ICD-10-CM | POA: Diagnosis not present

## 2020-05-27 DIAGNOSIS — W010XXA Fall on same level from slipping, tripping and stumbling without subsequent striking against object, initial encounter: Secondary | ICD-10-CM | POA: Diagnosis present

## 2020-05-27 DIAGNOSIS — E1165 Type 2 diabetes mellitus with hyperglycemia: Secondary | ICD-10-CM | POA: Diagnosis present

## 2020-05-27 DIAGNOSIS — I214 Non-ST elevation (NSTEMI) myocardial infarction: Secondary | ICD-10-CM | POA: Diagnosis present

## 2020-05-27 DIAGNOSIS — E559 Vitamin D deficiency, unspecified: Secondary | ICD-10-CM | POA: Diagnosis not present

## 2020-05-27 DIAGNOSIS — I5031 Acute diastolic (congestive) heart failure: Secondary | ICD-10-CM | POA: Diagnosis not present

## 2020-05-27 DIAGNOSIS — S9305XA Dislocation of left ankle joint, initial encounter: Secondary | ICD-10-CM | POA: Diagnosis not present

## 2020-05-27 DIAGNOSIS — Z8249 Family history of ischemic heart disease and other diseases of the circulatory system: Secondary | ICD-10-CM

## 2020-05-27 DIAGNOSIS — Z6841 Body Mass Index (BMI) 40.0 and over, adult: Secondary | ICD-10-CM

## 2020-05-27 DIAGNOSIS — Z833 Family history of diabetes mellitus: Secondary | ICD-10-CM

## 2020-05-27 DIAGNOSIS — Z89411 Acquired absence of right great toe: Secondary | ICD-10-CM

## 2020-05-27 DIAGNOSIS — E612 Magnesium deficiency: Secondary | ICD-10-CM | POA: Diagnosis not present

## 2020-05-27 DIAGNOSIS — Z9181 History of falling: Secondary | ICD-10-CM | POA: Diagnosis not present

## 2020-05-27 DIAGNOSIS — R079 Chest pain, unspecified: Secondary | ICD-10-CM | POA: Diagnosis not present

## 2020-05-27 DIAGNOSIS — X501XXA Overexertion from prolonged static or awkward postures, initial encounter: Secondary | ICD-10-CM

## 2020-05-27 NOTE — ED Triage Notes (Signed)
Pt reports feet sliding out from under her and falling hurting left ankle. Pt reports right great toe amputation last week.

## 2020-05-28 ENCOUNTER — Emergency Department (HOSPITAL_COMMUNITY): Payer: Medicare Other

## 2020-05-28 ENCOUNTER — Inpatient Hospital Stay (HOSPITAL_COMMUNITY)
Admission: EM | Admit: 2020-05-28 | Discharge: 2020-06-04 | DRG: 492 | Disposition: A | Discharge status: Skilled Nursing Facility | Payer: Medicare Other | Attending: Internal Medicine | Admitting: Internal Medicine

## 2020-05-28 DIAGNOSIS — I110 Hypertensive heart disease with heart failure: Secondary | ICD-10-CM | POA: Diagnosis not present

## 2020-05-28 DIAGNOSIS — S82892A Other fracture of left lower leg, initial encounter for closed fracture: Secondary | ICD-10-CM | POA: Diagnosis not present

## 2020-05-28 DIAGNOSIS — M545 Low back pain: Secondary | ICD-10-CM | POA: Diagnosis not present

## 2020-05-28 DIAGNOSIS — J449 Chronic obstructive pulmonary disease, unspecified: Secondary | ICD-10-CM | POA: Diagnosis not present

## 2020-05-28 DIAGNOSIS — R600 Localized edema: Secondary | ICD-10-CM | POA: Diagnosis not present

## 2020-05-28 DIAGNOSIS — S9305XA Dislocation of left ankle joint, initial encounter: Secondary | ICD-10-CM | POA: Diagnosis not present

## 2020-05-28 DIAGNOSIS — E1142 Type 2 diabetes mellitus with diabetic polyneuropathy: Secondary | ICD-10-CM | POA: Diagnosis not present

## 2020-05-28 DIAGNOSIS — X501XXA Overexertion from prolonged static or awkward postures, initial encounter: Secondary | ICD-10-CM | POA: Diagnosis not present

## 2020-05-28 DIAGNOSIS — E119 Type 2 diabetes mellitus without complications: Secondary | ICD-10-CM | POA: Diagnosis not present

## 2020-05-28 DIAGNOSIS — F17210 Nicotine dependence, cigarettes, uncomplicated: Secondary | ICD-10-CM | POA: Diagnosis present

## 2020-05-28 DIAGNOSIS — R079 Chest pain, unspecified: Secondary | ICD-10-CM | POA: Diagnosis not present

## 2020-05-28 DIAGNOSIS — Y92009 Unspecified place in unspecified non-institutional (private) residence as the place of occurrence of the external cause: Secondary | ICD-10-CM | POA: Diagnosis not present

## 2020-05-28 DIAGNOSIS — I10 Essential (primary) hypertension: Secondary | ICD-10-CM | POA: Diagnosis present

## 2020-05-28 DIAGNOSIS — K219 Gastro-esophageal reflux disease without esophagitis: Secondary | ICD-10-CM | POA: Diagnosis not present

## 2020-05-28 DIAGNOSIS — S99912A Unspecified injury of left ankle, initial encounter: Secondary | ICD-10-CM | POA: Diagnosis not present

## 2020-05-28 DIAGNOSIS — Z89411 Acquired absence of right great toe: Secondary | ICD-10-CM | POA: Diagnosis not present

## 2020-05-28 DIAGNOSIS — L97519 Non-pressure chronic ulcer of other part of right foot with unspecified severity: Secondary | ICD-10-CM | POA: Diagnosis not present

## 2020-05-28 DIAGNOSIS — D62 Acute posthemorrhagic anemia: Secondary | ICD-10-CM | POA: Diagnosis not present

## 2020-05-28 DIAGNOSIS — Z01810 Encounter for preprocedural cardiovascular examination: Secondary | ICD-10-CM

## 2020-05-28 DIAGNOSIS — E1140 Type 2 diabetes mellitus with diabetic neuropathy, unspecified: Secondary | ICD-10-CM | POA: Diagnosis not present

## 2020-05-28 DIAGNOSIS — F418 Other specified anxiety disorders: Secondary | ICD-10-CM | POA: Diagnosis present

## 2020-05-28 DIAGNOSIS — B951 Streptococcus, group B, as the cause of diseases classified elsewhere: Secondary | ICD-10-CM | POA: Diagnosis not present

## 2020-05-28 DIAGNOSIS — S82432A Displaced oblique fracture of shaft of left fibula, initial encounter for closed fracture: Secondary | ICD-10-CM | POA: Diagnosis not present

## 2020-05-28 DIAGNOSIS — T148XXA Other injury of unspecified body region, initial encounter: Secondary | ICD-10-CM

## 2020-05-28 DIAGNOSIS — I214 Non-ST elevation (NSTEMI) myocardial infarction: Secondary | ICD-10-CM | POA: Diagnosis not present

## 2020-05-28 DIAGNOSIS — I5032 Chronic diastolic (congestive) heart failure: Secondary | ICD-10-CM | POA: Diagnosis not present

## 2020-05-28 DIAGNOSIS — F17200 Nicotine dependence, unspecified, uncomplicated: Secondary | ICD-10-CM | POA: Diagnosis present

## 2020-05-28 DIAGNOSIS — D649 Anemia, unspecified: Secondary | ICD-10-CM

## 2020-05-28 DIAGNOSIS — W19XXXA Unspecified fall, initial encounter: Secondary | ICD-10-CM

## 2020-05-28 DIAGNOSIS — Z6841 Body Mass Index (BMI) 40.0 and over, adult: Secondary | ICD-10-CM | POA: Diagnosis not present

## 2020-05-28 DIAGNOSIS — I2510 Atherosclerotic heart disease of native coronary artery without angina pectoris: Secondary | ICD-10-CM | POA: Diagnosis not present

## 2020-05-28 DIAGNOSIS — R52 Pain, unspecified: Secondary | ICD-10-CM | POA: Diagnosis present

## 2020-05-28 DIAGNOSIS — S82842A Displaced bimalleolar fracture of left lower leg, initial encounter for closed fracture: Secondary | ICD-10-CM | POA: Diagnosis not present

## 2020-05-28 DIAGNOSIS — Z743 Need for continuous supervision: Secondary | ICD-10-CM | POA: Diagnosis not present

## 2020-05-28 DIAGNOSIS — S8262XD Displaced fracture of lateral malleolus of left fibula, subsequent encounter for closed fracture with routine healing: Secondary | ICD-10-CM | POA: Diagnosis not present

## 2020-05-28 DIAGNOSIS — E11621 Type 2 diabetes mellitus with foot ulcer: Secondary | ICD-10-CM | POA: Diagnosis not present

## 2020-05-28 DIAGNOSIS — Z7401 Bed confinement status: Secondary | ICD-10-CM | POA: Diagnosis not present

## 2020-05-28 DIAGNOSIS — Z8719 Personal history of other diseases of the digestive system: Secondary | ICD-10-CM | POA: Diagnosis not present

## 2020-05-28 DIAGNOSIS — Z794 Long term (current) use of insulin: Secondary | ICD-10-CM | POA: Diagnosis not present

## 2020-05-28 DIAGNOSIS — M6281 Muscle weakness (generalized): Secondary | ICD-10-CM | POA: Diagnosis not present

## 2020-05-28 DIAGNOSIS — M2550 Pain in unspecified joint: Secondary | ICD-10-CM | POA: Diagnosis not present

## 2020-05-28 DIAGNOSIS — S82892D Other fracture of left lower leg, subsequent encounter for closed fracture with routine healing: Secondary | ICD-10-CM | POA: Diagnosis not present

## 2020-05-28 DIAGNOSIS — S91204A Unspecified open wound of right lesser toe(s) with damage to nail, initial encounter: Secondary | ICD-10-CM | POA: Diagnosis not present

## 2020-05-28 DIAGNOSIS — S82852A Displaced trimalleolar fracture of left lower leg, initial encounter for closed fracture: Secondary | ICD-10-CM | POA: Diagnosis not present

## 2020-05-28 DIAGNOSIS — G8918 Other acute postprocedural pain: Secondary | ICD-10-CM | POA: Diagnosis not present

## 2020-05-28 DIAGNOSIS — E11628 Type 2 diabetes mellitus with other skin complications: Secondary | ICD-10-CM | POA: Diagnosis not present

## 2020-05-28 DIAGNOSIS — R262 Difficulty in walking, not elsewhere classified: Secondary | ICD-10-CM | POA: Diagnosis not present

## 2020-05-28 DIAGNOSIS — R6889 Other general symptoms and signs: Secondary | ICD-10-CM | POA: Diagnosis not present

## 2020-05-28 DIAGNOSIS — S93432A Sprain of tibiofibular ligament of left ankle, initial encounter: Secondary | ICD-10-CM | POA: Diagnosis not present

## 2020-05-28 DIAGNOSIS — M546 Pain in thoracic spine: Secondary | ICD-10-CM | POA: Diagnosis not present

## 2020-05-28 DIAGNOSIS — W010XXA Fall on same level from slipping, tripping and stumbling without subsequent striking against object, initial encounter: Secondary | ICD-10-CM | POA: Diagnosis present

## 2020-05-28 DIAGNOSIS — T1490XA Injury, unspecified, initial encounter: Secondary | ICD-10-CM

## 2020-05-28 DIAGNOSIS — M869 Osteomyelitis, unspecified: Secondary | ICD-10-CM | POA: Diagnosis not present

## 2020-05-28 DIAGNOSIS — E1165 Type 2 diabetes mellitus with hyperglycemia: Secondary | ICD-10-CM | POA: Diagnosis not present

## 2020-05-28 DIAGNOSIS — E785 Hyperlipidemia, unspecified: Secondary | ICD-10-CM | POA: Diagnosis not present

## 2020-05-28 DIAGNOSIS — Z20822 Contact with and (suspected) exposure to covid-19: Secondary | ICD-10-CM | POA: Diagnosis not present

## 2020-05-28 DIAGNOSIS — G8929 Other chronic pain: Secondary | ICD-10-CM | POA: Diagnosis not present

## 2020-05-28 DIAGNOSIS — M2041 Other hammer toe(s) (acquired), right foot: Secondary | ICD-10-CM | POA: Diagnosis not present

## 2020-05-28 DIAGNOSIS — S82832D Other fracture of upper and lower end of left fibula, subsequent encounter for closed fracture with routine healing: Secondary | ICD-10-CM | POA: Diagnosis not present

## 2020-05-28 LAB — BASIC METABOLIC PANEL
Anion gap: 12 (ref 5–15)
BUN: 18 mg/dL (ref 6–20)
CO2: 27 mmol/L (ref 22–32)
Calcium: 8.8 mg/dL — ABNORMAL LOW (ref 8.9–10.3)
Chloride: 93 mmol/L — ABNORMAL LOW (ref 98–111)
Creatinine, Ser: 1.09 mg/dL — ABNORMAL HIGH (ref 0.44–1.00)
GFR calc Af Amer: >60 mL/min (ref >60)
GFR calc non Af Amer: 56 mL/min — ABNORMAL LOW (ref >60)
Glucose, Bld: 223 mg/dL — ABNORMAL HIGH (ref 70–99)
Potassium: 4 mmol/L (ref 3.5–5.1)
Sodium: 132 mmol/L — ABNORMAL LOW (ref 135–145)

## 2020-05-28 LAB — CBC
HCT: 38 % (ref 36.0–46.0)
Hemoglobin: 11.3 g/dL — ABNORMAL LOW (ref 12.0–15.0)
MCH: 23.8 pg — ABNORMAL LOW (ref 26.0–34.0)
MCHC: 29.7 g/dL — ABNORMAL LOW (ref 30.0–36.0)
MCV: 80 fL (ref 80.0–100.0)
Platelets: 364 10*3/uL (ref 150–400)
RBC: 4.75 MIL/uL (ref 3.87–5.11)
RDW: 21.4 % — ABNORMAL HIGH (ref 11.5–15.5)
WBC: 14.2 10*3/uL — ABNORMAL HIGH (ref 4.0–10.5)
nRBC: 0 % (ref 0.0–0.2)

## 2020-05-28 LAB — HIV ANTIBODY (ROUTINE TESTING W REFLEX): HIV Screen 4th Generation wRfx: NONREACTIVE

## 2020-05-28 LAB — TROPONIN I (HIGH SENSITIVITY)
Troponin I (High Sensitivity): 3 ng/L (ref <18)
Troponin I (High Sensitivity): 4 ng/L (ref <18)

## 2020-05-28 LAB — CBG MONITORING, ED: Glucose-Capillary: 159 mg/dL — ABNORMAL HIGH (ref 70–99)

## 2020-05-28 LAB — SARS CORONAVIRUS 2 BY RT PCR (HOSPITAL ORDER, PERFORMED IN ~~LOC~~ HOSPITAL LAB): SARS Coronavirus 2: NEGATIVE

## 2020-05-28 LAB — GLUCOSE, CAPILLARY: Glucose-Capillary: 189 mg/dL — ABNORMAL HIGH (ref 70–99)

## 2020-05-28 MED ORDER — INSULIN ASPART 100 UNIT/ML ~~LOC~~ SOLN
0.0000 [IU] | Freq: Every day | SUBCUTANEOUS | Status: DC
  Filled 2020-05-28: qty 0.05

## 2020-05-28 MED ORDER — MOMETASONE FURO-FORMOTEROL FUM 100-5 MCG/ACT IN AERO
2.0000 | INHALATION_SPRAY | Freq: Two times a day (BID) | RESPIRATORY_TRACT | Status: DC
  Administered 2020-05-29 – 2020-06-04 (×14): 2 via RESPIRATORY_TRACT
  Filled 2020-05-28: qty 8.8

## 2020-05-28 MED ORDER — QUETIAPINE FUMARATE 100 MG PO TABS
100.0000 mg | ORAL_TABLET | Freq: Every day | ORAL | Status: DC
  Administered 2020-05-28 – 2020-06-04 (×8): 100 mg via ORAL
  Filled 2020-05-28 (×8): qty 1

## 2020-05-28 MED ORDER — ATORVASTATIN CALCIUM 10 MG PO TABS
10.0000 mg | ORAL_TABLET | Freq: Every day | ORAL | Status: DC
  Administered 2020-05-29 – 2020-06-04 (×7): 10 mg via ORAL
  Filled 2020-05-28 (×7): qty 1

## 2020-05-28 MED ORDER — SENNOSIDES-DOCUSATE SODIUM 8.6-50 MG PO TABS: 1.0000 | ORAL_TABLET | Freq: Every evening | ORAL | Status: DC | PRN

## 2020-05-28 MED ORDER — INSULIN GLARGINE 100 UNIT/ML ~~LOC~~ SOLN
50.0000 [IU] | Freq: Two times a day (BID) | SUBCUTANEOUS | Status: DC
  Administered 2020-05-28 – 2020-05-29 (×4): 50 [IU] via SUBCUTANEOUS
  Filled 2020-05-28 (×7): qty 0.5

## 2020-05-28 MED ORDER — INSULIN ASPART 100 UNIT/ML ~~LOC~~ SOLN
6.0000 [IU] | Freq: Three times a day (TID) | SUBCUTANEOUS | Status: DC
  Administered 2020-05-28: 6 [IU] via SUBCUTANEOUS
  Filled 2020-05-28: qty 0.06

## 2020-05-28 MED ORDER — TRAZODONE HCL 50 MG PO TABS
50.0000 mg | ORAL_TABLET | Freq: Every day | ORAL | Status: DC
  Administered 2020-05-28 – 2020-06-04 (×8): 50 mg via ORAL
  Filled 2020-05-28 (×8): qty 1

## 2020-05-28 MED ORDER — ONDANSETRON HCL 4 MG PO TABS: 4.0000 mg | ORAL_TABLET | Freq: Four times a day (QID) | ORAL | Status: DC | PRN

## 2020-05-28 MED ORDER — HYDRALAZINE HCL 25 MG PO TABS
25.0000 mg | ORAL_TABLET | Freq: Three times a day (TID) | ORAL | Status: DC
  Administered 2020-05-28 – 2020-06-04 (×21): 25 mg via ORAL
  Filled 2020-05-28 (×21): qty 1

## 2020-05-28 MED ORDER — DOCUSATE SODIUM 100 MG PO CAPS
100.0000 mg | ORAL_CAPSULE | Freq: Two times a day (BID) | ORAL | Status: DC
  Administered 2020-05-28 – 2020-06-04 (×14): 100 mg via ORAL
  Filled 2020-05-28 (×15): qty 1

## 2020-05-28 MED ORDER — FUROSEMIDE 40 MG PO TABS
40.0000 mg | ORAL_TABLET | Freq: Every day | ORAL | Status: DC
  Administered 2020-05-29 – 2020-06-04 (×7): 40 mg via ORAL
  Filled 2020-05-28 (×7): qty 1

## 2020-05-28 MED ORDER — IPRATROPIUM-ALBUTEROL 0.5-2.5 (3) MG/3ML IN SOLN: 3.0000 mL | Freq: Four times a day (QID) | RESPIRATORY_TRACT | Status: DC | PRN

## 2020-05-28 MED ORDER — BISACODYL 10 MG RE SUPP: 10.0000 mg | Freq: Every day | RECTAL | Status: DC | PRN

## 2020-05-28 MED ORDER — INSULIN ASPART 100 UNIT/ML ~~LOC~~ SOLN
0.0000 [IU] | Freq: Three times a day (TID) | SUBCUTANEOUS | Status: DC
  Administered 2020-05-28: 3 [IU] via SUBCUTANEOUS
  Administered 2020-05-29: 5 [IU] via SUBCUTANEOUS
  Administered 2020-05-29 – 2020-05-30 (×2): 3 [IU] via SUBCUTANEOUS
  Administered 2020-05-30 (×2): 5 [IU] via SUBCUTANEOUS
  Administered 2020-05-31: 3 [IU] via SUBCUTANEOUS
  Administered 2020-05-31: 2 [IU] via SUBCUTANEOUS
  Administered 2020-06-01 – 2020-06-02 (×3): 3 [IU] via SUBCUTANEOUS
  Administered 2020-06-03: 2 [IU] via SUBCUTANEOUS
  Administered 2020-06-04: 5 [IU] via SUBCUTANEOUS
  Filled 2020-05-28: qty 0.15

## 2020-05-28 MED ORDER — TRAZODONE HCL 50 MG PO TABS: 25.0000 mg | ORAL_TABLET | Freq: Every evening | ORAL | Status: DC | PRN

## 2020-05-28 MED ORDER — PREGABALIN 75 MG PO CAPS
225.0000 mg | ORAL_CAPSULE | Freq: Every day | ORAL | Status: AC
  Administered 2020-05-28: 225 mg via ORAL
  Filled 2020-05-28: qty 3

## 2020-05-28 MED ORDER — LURASIDONE HCL 20 MG PO TABS
20.0000 mg | ORAL_TABLET | Freq: Every day | ORAL | Status: DC
  Administered 2020-05-28 – 2020-06-04 (×8): 20 mg via ORAL
  Filled 2020-05-28 (×8): qty 1

## 2020-05-28 MED ORDER — BUPROPION HCL ER (SR) 100 MG PO TB12
200.0000 mg | ORAL_TABLET | Freq: Two times a day (BID) | ORAL | Status: DC
  Administered 2020-05-28 – 2020-06-04 (×14): 200 mg via ORAL
  Filled 2020-05-28 (×17): qty 2

## 2020-05-28 MED ORDER — HYDROMORPHONE HCL 1 MG/ML IJ SOLN
1.0000 mg | INTRAMUSCULAR | Status: DC | PRN
  Administered 2020-05-28 – 2020-05-29 (×2): 1 mg via INTRAVENOUS
  Filled 2020-05-28 (×2): qty 1

## 2020-05-28 MED ORDER — PREGABALIN 75 MG PO CAPS: 150.0000 mg | ORAL_CAPSULE | Freq: Every day | ORAL | Status: DC

## 2020-05-28 MED ORDER — BACITRACIN ZINC 500 UNIT/GM EX OINT
1.0000 "application " | TOPICAL_OINTMENT | Freq: Two times a day (BID) | CUTANEOUS | Status: DC
  Administered 2020-05-28 – 2020-06-04 (×16): 1 via TOPICAL
  Filled 2020-05-28: qty 0.9
  Filled 2020-05-28: qty 28.4

## 2020-05-28 MED ORDER — ALBUTEROL SULFATE (2.5 MG/3ML) 0.083% IN NEBU: 2.5000 mg | INHALATION_SOLUTION | RESPIRATORY_TRACT | Status: DC | PRN

## 2020-05-28 MED ORDER — METOPROLOL TARTRATE 50 MG PO TABS
50.0000 mg | ORAL_TABLET | Freq: Two times a day (BID) | ORAL | Status: DC
  Administered 2020-05-28 – 2020-06-04 (×15): 50 mg via ORAL
  Filled 2020-05-28 (×15): qty 1

## 2020-05-28 MED ORDER — ACETAMINOPHEN 325 MG PO TABS
650.0000 mg | ORAL_TABLET | Freq: Four times a day (QID) | ORAL | Status: DC | PRN
  Administered 2020-05-29 – 2020-05-31 (×2): 650 mg via ORAL
  Filled 2020-05-28 (×2): qty 2

## 2020-05-28 MED ORDER — HYDROMORPHONE HCL 1 MG/ML IJ SOLN
1.0000 mg | Freq: Once | INTRAMUSCULAR | Status: AC
  Administered 2020-05-28: 1 mg via INTRAVENOUS
  Filled 2020-05-28: qty 1

## 2020-05-28 MED ORDER — CEFTRIAXONE SODIUM 2 G IV SOLR: 2.0000 g | Freq: Every day | INTRAVENOUS | Status: DC

## 2020-05-28 MED ORDER — OXYCODONE HCL 5 MG PO TABS
5.0000 mg | ORAL_TABLET | ORAL | Status: DC | PRN
  Administered 2020-05-28 – 2020-05-29 (×3): 5 mg via ORAL
  Filled 2020-05-28 (×3): qty 1

## 2020-05-28 MED ORDER — DULOXETINE HCL 30 MG PO CPEP
30.0000 mg | ORAL_CAPSULE | Freq: Every day | ORAL | Status: DC
  Administered 2020-05-29: 30 mg via ORAL
  Filled 2020-05-28 (×2): qty 1

## 2020-05-28 MED ORDER — ACETAMINOPHEN 650 MG RE SUPP: 650.0000 mg | Freq: Four times a day (QID) | RECTAL | Status: DC | PRN

## 2020-05-28 MED ORDER — HEPARIN SODIUM (PORCINE) 5000 UNIT/ML IJ SOLN
5000.0000 [IU] | Freq: Three times a day (TID) | INTRAMUSCULAR | Status: DC
  Administered 2020-05-28 – 2020-05-29 (×3): 5000 [IU] via SUBCUTANEOUS
  Filled 2020-05-28 (×3): qty 1

## 2020-05-28 MED ORDER — ONDANSETRON HCL 4 MG/2ML IJ SOLN: 4.0000 mg | Freq: Four times a day (QID) | INTRAMUSCULAR | Status: DC | PRN

## 2020-05-28 MED ORDER — SODIUM CHLORIDE 0.9 % IV SOLN
2.0000 g | INTRAVENOUS | Status: DC
  Administered 2020-05-29: 2 g via INTRAVENOUS
  Filled 2020-05-28: qty 20

## 2020-05-28 MED ORDER — AMLODIPINE BESYLATE 10 MG PO TABS
10.0000 mg | ORAL_TABLET | Freq: Every day | ORAL | Status: DC
  Administered 2020-05-29 – 2020-06-04 (×7): 10 mg via ORAL
  Filled 2020-05-28 (×7): qty 1

## 2020-05-28 MED ORDER — KETAMINE HCL 50 MG/5ML IJ SOSY
0.3000 mg/kg | PREFILLED_SYRINGE | Freq: Once | INTRAMUSCULAR | Status: AC
  Administered 2020-05-28: 50 mg via INTRAVENOUS
  Filled 2020-05-28: qty 5

## 2020-05-28 NOTE — ED Provider Notes (Signed)
[facility]-EMERGENCY DEPT Provider Note   CSN: [id number] Arrival date & time: 05/27/20  2206     History Chief Complaint  Patient presents with  . Ankle Pain    [name] is a 59 y.o. female.  59 year old female with extensive past medical history below including COPD, IDDM, CHF, NSTEMI, morbid obesity who p/w L ankle pain.  Yesterday morning, patient was preparing breakfast and lost her balance, falling onto her left ankle causing an injury.  She notes that she has diabetic neuropathy of both feet and cannot feel them.  She reports having some thoracic back pain since the fall as well as central chest tenderness since the fall.  Chest pain has been while she waited in the waiting room.  She reports mild shortness of breath that is stable since discharge from the hospital.  Mild nausea but this may be related to ankle pain.  Of note, patient was admitted to [facility] 8/12 through 8/22 for NSTEMI complicated by pulmonary edema causing respiratory failure.  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  She was discharged home 2 days ago on IV ceftriaxone through PICC line.  The history is provided by the patient and medical records.  Ankle Pain      Past Medical History:  Diagnosis Date  . Anemia    iron deficiency  . Anxiety   . COPD (chronic obstructive pulmonary disease) (HCC)   . Depression   . Diabetes mellitus    type 2  . Diabetic foot ulcer (HCC) 04/2018   right ankle  . GERD (gastroesophageal reflux disease)   . Headache(784.0)   . History of colonic polyps   . Hyperlipidemia   . Hypertension   . Neuropathy   . Peptic ulcer disease     Patient Active Problem List   Diagnosis Date Noted  . Closed left ankle fracture 05/28/2020  . Moderate protein malnutrition (HCC)   . Adult BMI 50.0-59.9 kg/sq m  (HCC)   . Normocytic anemia 04/21/2018  . Diabetic foot infection (HCC) 04/21/2018  . Mild renal insufficiency 04/21/2018  . Diabetic infection of right foot (HCC)   . Preventative health care 04/14/2011  . DYSMETABOLIC SYNDROME 12/08/2010  . ULNAR NEUROPATHY, BILATERAL 10/01/2010  . Insulin-requiring or dependent type II diabetes mellitus (HCC) 09/11/2010  . HYPERLIPIDEMIA 09/11/2010  . Morbid (severe) obesity due to excess calories (HCC) 09/11/2010  . TOBACCO USE 09/11/2010  . Depression with anxiety 09/11/2010  . Essential hypertension 09/11/2010  . GERD 09/11/2010  . PEPTIC ULCER DISEASE 09/11/2010  . COLONIC POLYPS, HX OF 09/11/2010  . Lumbosacral spondylosis 02/27/2010    Past Surgical History:  Procedure Laterality Date  . ESOPHAGOGASTRODUODENOSCOPY  10/11/2011   Procedure: ESOPHAGOGASTRODUODENOSCOPY (EGD);  Surgeon: Erick Blinks, MD;  Location:  [facility] ENDOSCOPY;  Service: Gastroenterology;  Laterality: N/A;  . TUBAL LIGATION       OB History   No obstetric history on file.     Family History  Problem Relation Age of Onset  . Alcohol abuse Other   . Drug abuse Other   . Arthritis Other   . Diabetes Other   . Depression Other   . Hypertension Other   . Kidney disease Other   . Cancer Other        Breast,Colon,Lung, and Prostate Cancer  . Stomach cancer Father 29  . Heart attack Brother  51  . Diabetes type II Other        brother, both parents    Social History   Tobacco Use  . Smoking status: Current Every Day Smoker    Packs/day: 1.00    Years: 25.00    Pack years: 25.00    Types: Cigarettes  . Smokeless tobacco: Never Used  Vaping Use  . Vaping Use: Never used  Substance Use Topics  . Alcohol use: No  . Drug use: No    Home Medications Prior to Admission medications   Medication Sig Start Date End Date Taking? Authorizing Provider  acetaminophen (TYLENOL) 500 MG tablet Take 1,000-1,050 mg by mouth daily as needed for moderate pain.    [provider]  Amino Acids-Protein Hydrolys (FEEDING SUPPLEMENT, PRO-STAT SUGAR FREE 64,) LIQD Take 30 mLs by mouth 2 (two) times daily. 04/27/18   Mikhail, Nita Sells, DO  amLODipine (NORVASC) 5 MG tablet Take 5 mg by mouth daily. 03/11/18   [provider]  amoxicillin-clavulanate (AUGMENTIN) 875-125 MG tablet Take 1 tablet by mouth every 12 (twelve) hours. 04/27/18   Mikhail, Nita Sells, DO  buPROPion (WELLBUTRIN SR) 200 MG 12 hr tablet Take 200 mg by mouth 2 (two) times daily. 04/16/18   [provider]  collagenase (SANTYL) ointment Apply topically daily. Apply Santyl to right plantar foot Q day, then cover with moist gauze and dry abd pad and kerlex. Apply to right heel ulcer plus dry dressing 04/28/18   Edsel Petrin, DO  collagenase [facility]) ointment Apply to wound 3 times/week with dry dressing 04/28/18   Nadara Mustard, MD  doxycycline (VIBRA-TABS) 100 MG tablet Take 1 tablet (100 mg total) by mouth every 12 (twelve) hours. 04/27/18   Mikhail, Nita Sells, DO  DULoxetine (CYMBALTA) 30 MG capsule Take 30 mg by mouth daily. 04/16/18   [provider]  DULoxetine (CYMBALTA) 60 MG capsule Take 60 mg by mouth daily.     [provider]  fluticasone (FLONASE) 50 MCG/ACT nasal spray Place 2 sprays into both nostrils daily. 02/16/18   [provider]  HUMALOG KWIKPEN 100 UNIT/ML KiwkPen Inject 0-25 Units as directed 3 (three) times daily as needed. Per sliding scale 02/10/18   [provider]  ibuprofen (ADVIL,MOTRIN) 200 MG tablet Take 600 mg by mouth 3 (three) times daily as needed for moderate pain.    [provider]  insulin aspart (NOVOLOG FLEXPEN) 100 UNIT/ML injection Inject 10-25 Units into the skin 3 (three) times daily before meals. As directed per sliding scale 02/10/11 02/10/12  Etta Grandchild, MD  insulin glargine (LANTUS) 100 UNIT/ML injection Inject 80 Units into the skin 2 (two) times daily.     [provider]  INVOKANA 300 MG TABS  tablet Take 300 mg by mouth daily. 03/19/18   [provider]  LATUDA 20 MG TABS tablet Take 20 mg by mouth at bedtime. 04/16/18   [provider]  LYRICA 225 MG capsule Take 675 mg by mouth at bedtime. 04/09/18   [provider]  meloxicam (MOBIC) 7.5 MG tablet Take 7.5 mg by mouth at bedtime. 03/29/18   [provider]  metFORMIN (GLUCOPHAGE) 1000 MG tablet Take 1,000 mg by mouth 2 (two) times daily with a meal.    [provider]  metoprolol tartrate (LOPRESSOR) 50 MG tablet Take 50 mg by mouth 2 (two) times daily. 02/10/18   [provider]  nystatin (MYCOSTATIN) 100000 UNIT/ML suspension Take 10 mLs by mouth 4 (four) times  daily. Pt. States they do not measure their dose and hat they "just takes a swig" 03/21/18   [provider]  omeprazole (PRILOSEC) 40 MG capsule Take 40 mg by mouth daily. 03/19/18   [provider]  QUEtiapine (SEROQUEL) 100 MG tablet Take 100 mg by mouth at bedtime. 04/16/18   [provider]  SYMBICORT 80-4.5 MCG/ACT inhaler Inhale 2 puffs into the lungs 2 (two) times daily. 03/19/18   [provider]  VICTOZA 18 MG/3ML SOPN Inject into the skin daily. 1.8 ml 02/10/18   [provider]    Allergies    Ceftriaxone  Review of Systems   Review of Systems All other systems reviewed and are negative except that which was mentioned in HPI  Physical Exam Updated Vital Signs BP (!) 151/62   Pulse 83   Temp 98.1 F (36.7 C) (Oral)   Resp 20   Ht 5\' 6"  (1.676 m)   Wt (!) 152 kg   LMP  (LMP Unknown)   SpO2 99%   BMI 54.07 kg/m   Physical Exam Vitals and nursing note reviewed.  Constitutional:      General: She is not in acute distress.    Appearance: She is well-developed. She is obese.  HENT:     Head: Normocephalic and atraumatic.  Eyes:     Conjunctiva/sclera: Conjunctivae normal.  Cardiovascular:     Rate and Rhythm: Normal rate and regular rhythm.     Heart sounds:  Normal heart sounds. No murmur heard.   Pulmonary:     Effort: Pulmonary effort is normal.     Breath sounds: Normal breath sounds.  Chest:     Chest wall: Tenderness (central) present.  Abdominal:     General: Bowel sounds are normal. There is no distension.     Palpations: Abdomen is soft.     Tenderness: There is no abdominal tenderness.  Musculoskeletal:        General: Swelling and signs of injury present.     Cervical back: Neck supple.     Comments: L ankle ecchymotic and edematous, 2+ DP pulse; R great toe amputation in bandage, 2nd toe with partial nail avulsion and dried blood but no laceration  Skin:    General: Skin is warm and dry.  Neurological:     Mental Status: She is alert and oriented to person, place, and time.     Comments: Fluent speech Loss of sensation b/l feet  Psychiatric:        Judgment: Judgment normal.     ED Results / Procedures / Treatments   Labs (all labs ordered are listed, but only abnormal results are displayed) Labs Reviewed  BASIC METABOLIC PANEL - Abnormal; Notable for the following components:      Result Value   Sodium 132 (*)    Chloride 93 (*)    Glucose, Bld 223 (*)    Creatinine, Ser 1.09 (*)    Calcium 8.8 (*)    GFR calc non Af Amer 56 (*)    All other components within normal limits  CBC - Abnormal; Notable for the following components:   WBC 14.2 (*)    Hemoglobin 11.3 (*)    MCH 23.8 (*)    MCHC 29.7 (*)    RDW 21.4 (*)    All other components within normal limits  SARS CORONAVIRUS 2 BY RT PCR (HOSPITAL ORDER, PERFORMED IN [facility])  TROPONIN I (HIGH SENSITIVITY)  TROPONIN I (HIGH SENSITIVITY)  EKG None  Radiology DG Chest 2 View  Result Date: 05/28/2020 CLINICAL DATA:  59 year old female status post fall last night with central chest pain and back pain. EXAM: CHEST - 2 VIEW COMPARISON:  Chest radiographs 05/23/2020 and earlier. FINDINGS: Portable AP semi upright view at 0923 hours. A right  PICC line is now in place, tip at the level of the SVC just above the carina. Improved lung volumes. Allowing for portable technique the lungs are clear. Mediastinal contours are within normal limits. Visualized tracheal air column is within normal limits. No pneumothorax or pleural effusion. No acute osseous abnormality identified.  Paucity of bowel gas. IMPRESSION: No acute cardiopulmonary abnormality or acute traumatic injury identified. Electronically Signed   By: Odessa FlemingH  Hall M.D.   On: 05/28/2020 09:50   DG Thoracic Spine 2 View  Result Date: 05/28/2020 CLINICAL DATA:  59 year old female status post fall last night with central chest pain and back pain. EXAM: THORACIC SPINE 2 VIEWS COMPARISON:  Portable chest radiograph today.  CTA chest 05/16/2020. FINDINGS: Normal thoracic segmentation. Stable thoracic vertebral height and alignment from the CTA earlier this month. Chronic L1 inferior endplate deformity and sclerosis appears stable. Relatively preserved thoracic disc spaces. Multilevel mild mid and lower thoracic endplate spurring. Grossly intact visible posterior ribs. No acute osseous abnormality identified. Right PICC line redemonstrated. IMPRESSION: 1. No acute osseous abnormality identified in the thoracic spine. 2. Chronic L1 inferior endplate deformity appears stable. Electronically Signed   By: Odessa FlemingH  Hall M.D.   On: 05/28/2020 09:53   DG Lumbar Spine Complete  Result Date: 05/28/2020 CLINICAL DATA:  59 year old female status post fall last night with central chest pain and back pain. EXAM: LUMBAR SPINE - COMPLETE 4+ VIEW COMPARISON:  Thoracic spine radiographs today. Lumbar [facility]2018[facility]16[facility]. FINDINGS: Normal lumbar segmentation. Chronic L1 inferior endplate deformity with associated endplate sclerosis and adjacent vacuum disc appears stable from the CTA earlier this month. Stable lumbar vertebral height and alignment  elsewhere. No pars fracture. No acute osseous abnormality identified. Visible sacrum and SI joints appear intact. Negative abdominal visceral contours. IMPRESSION: 1. No acute osseous abnormality identified in the lumbar spine. 2. Chronic L1 inferior endplate deformity with L1-L2 vacuum disc. Electronically Signed   By: Odessa FlemingH  Hall M.D.   On: 05/28/2020 09:56   DG Ankle 2 Views Left  Result Date: 05/27/2020 CLINICAL DATA:  Left ankle deformity following fall EXAM: LEFT ANKLE - 2 VIEW COMPARISON:  None. FINDINGS: Two view radiograph left ankle demonstrates a a bimalleolar fracture dislocation of the left ankle. An oblique fracture seen of the distal left fibular metaphysis extending to the level of the tibial plafond and with 1 shaft with posterior displacement, roughly 1 cm override, and roughly 30 degrees posterior angulation of the distal fracture fragment. There is a fracture of the posterior malleolus of the distal tibia with roughly 8 mm override and 5 mm posterior displacement of the fracture fragment. There is posterior and lateral dislocation of the talar dome in relation to the in tibial plafond with resultant widening of the medial joint space in keeping with injury to the deltoid ligament. Extensive surrounding soft tissue swelling. Small plantar calcaneal spur. Moderate midfoot degenerative arthritis noted. IMPRESSION: 1. Bimalleolar fracture dislocation of the left ankle as described above. Posterolateral tibiotalar dislocation. Electronically Signed   By: Helyn NumbersAshesh  Parikh MD   On: 05/27/2020 22:34   DG Ankle Complete Left  Result Date: 05/28/2020 CLINICAL  DATA:  Post reduction EXAM: LEFT ANKLE COMPLETE - 3+ VIEW COMPARISON:  05/27/2020 FINDINGS: Near complete interval reduction of a compound trimalleolar fracture dislocation of the left ankle mortise seen on prior examination. There is near anatomic apposition of the ankle mortise and fracture fragments, with mild, persistent displacement of the  posterior malleolus. Midfoot arthrosis is again noted. Diffuse soft tissue edema about the foot and ankle. Cast material applied. IMPRESSION: Near complete interval reduction of a compound trimalleolar fracture dislocation of the left ankle mortise seen on prior examination. There is near anatomic apposition of the ankle mortise and fracture fragments, with mild, persistent displacement of the posterior malleolus. Electronically Signed   By: Lauralyn Primes M.D.   On: 05/28/2020 11:09    Procedures .Ortho Injury Treatment  Date/Time: 05/28/2020 11:57 AM Performed by: Laurence Spates, MD Authorized by: Laurence Spates, MD   Consent:    Consent obtained:  Verbal   Consent given by:  PatientInjury location: ankle Location details: left ankle Injury type: fracture-dislocation Fracture type: bimalleolar Pre-procedure distal perfusion: normal Pre-procedure neurological function: diminished Pre-procedure neurological function comment: chronic Pre-procedure range of motion: reduced  Anesthesia: Local anesthesia used: no  Patient sedated: NoManipulation performed: yes Skin traction used: no Skeletal traction used: no Reduction successful: yes X-ray confirmed reduction: yes Immobilization: splint Splint type: short leg and ankle stirrup Supplies used: cotton padding,  plaster and elastic bandage Post-procedure distal perfusion: normal Post-procedure neurological function: diminished Post-procedure range of motion: unchanged Patient tolerance: patient tolerated the procedure well with no immediate complications    (including critical care time)  Medications Ordered in ED Medications  bacitracin ointment 1 application (1 application Topical Given 05/28/20 1010)  ketamine 50 mg in normal saline 5 mL (10 mg/mL) syringe (50 mg Intravenous Given 05/28/20 1027)    ED Course  I have reviewed the triage vital signs and the nursing notes.  Pertinent labs & imaging results that were  available during my care of the patient were reviewed by me and considered in my medical decision making (see chart for details).    MDM Rules/Calculators/A&P                          Patient was alert and no acute distress on exam with reassuring vital signs.  Normal distal pulses.  X-ray of ankle shows bimalleolar fracture dislocation.  Regarding her chest pain, it is reproducible on exam, began at rest after the fall, and is not associated with other red flag symptoms therefore is less concerning for ACS. CXR and XR of t and l spine negative acute. Labs including troponin reassuring.   She is unable to ambulate at all and given her morbid obesity, is unable to perform ADLs. Discussed w/ ortho, Dr. Dion Saucier, who recommended bedside reduction, then CT ankle in anticipation of future surgery. He has requested [facility] admission given need for cardiology pre-op eval. Reduced at bedside, see procedure note. Repeat films show improved alignment. Charma Igo, ortho PA, evaluated pt in ED. Discussed [facility] w/ Dr. Alanda Slim.   Final Clinical Impression(s) / ED Diagnoses Final diagnoses:  Closed bimalleolar fracture of left ankle, initial encounter    Rx / DC Orders ED Discharge Orders    None       Sita Mangen, Ambrose Finland, MD 05/28/20 1201

## 2020-05-28 NOTE — Consult Note (Signed)
Reason for Consult:Left ankle fx Referring Physician: R Little  [name] is an 59 y.o. female.  HPI: Jamyla slipped at home and fell, hurting her left ankle. She had immediate pain and could not bear weight. She was brought to the ED where x-rays showed an ankle fx and orthopedic surgery was consulted. She c/o localized pain to the area. She also is recently s/p contralateral hallux amputation.  Past Medical History:  Diagnosis Date  . Anemia    iron deficiency  . Anxiety   . COPD (chronic obstructive pulmonary disease) (HCC)   . Depression   . Diabetes mellitus    type 2  . Diabetic foot ulcer (HCC) 04/2018   right ankle  . GERD (gastroesophageal reflux disease)   . Headache(784.0)   . History of colonic polyps   . Hyperlipidemia   . Hypertension   . Neuropathy   . Peptic ulcer disease     Past Surgical History:  Procedure Laterality Date  . ESOPHAGOGASTRODUODENOSCOPY  10/11/2011   Procedure: ESOPHAGOGASTRODUODENOSCOPY (EGD);  Surgeon: Erick Blinks, MD;  Location: [facility] ENDOSCOPY;  Service: Gastroenterology;  Laterality: N/A;  . TUBAL LIGATION      Family History  Problem Relation Age of Onset  . Alcohol abuse Other   . Drug abuse Other   . Arthritis Other   . Diabetes Other   . Depression Other   . Hypertension Other   . Kidney disease Other   . Cancer Other        Breast,Colon,Lung, and Prostate Cancer  . Stomach cancer Father 36  . Heart attack Brother 51  . Diabetes type II Other        brother, both parents    Social History:  reports that she has been smoking cigarettes. She has a 25.00 pack-year smoking history. She has never used smokeless tobacco. She reports that she does not drink alcohol and does not use drugs.  Allergies:  Allergies  Allergen Reactions  . Ceftriaxone Diarrhea    Extreme vomiting and diarrhea    Medications: I have reviewed the patient's current medications.  Results for orders placed or performed during the hospital encounter of  05/28/20 (from the past 48 hour(s))  Basic metabolic panel     Status: Abnormal   Collection Time: 05/28/20  9:57 AM  Result Value Ref Range   Sodium 132 (L) 135 - 145 mmol/L   Potassium 4.0 3.5 - 5.1 mmol/L   Chloride 93 (L) 98 - 111 mmol/L   CO2 27 22 - 32 mmol/L   Glucose, Bld 223 (H) 70 - 99 mg/dL    Comment: Glucose reference range applies only to samples taken after fasting for at least 8 hours.   BUN 18 6 - 20 mg/dL   Creatinine, Ser 0.35 (H) 0.44 - 1.00 mg/dL   Calcium 8.8 (L) 8.9 - 10.3 mg/dL   GFR calc non Af Amer 56 (L) >60 mL/min   GFR calc Af Amer >60 >60 mL/min   Anion gap 12 5 - 15    Comment: Performed at [facility], [address] [place], [place] [address]  CBC     Status: Abnormal   Collection Time: 05/28/20  9:57 AM  Result Value Ref Range   WBC 14.2 (H) 4.0 - 10.5 K/uL   RBC 4.75 3.87 - 5.11 MIL/uL   Hemoglobin 11.3 (L) 12.0 - 15.0 g/dL   HCT 18.2 36 - 46 %   MCV 80.0 80.0 - 100.0 fL  MCH 23.8 (L) 26.0 - 34.0 pg   MCHC 29.7 (L) 30.0 - 36.0 g/dL   RDW 51.0 (H) 25.8 - 52.7 %   Platelets 364 150 - 400 K/uL   nRBC 0.0 0.0 - 0.2 %    Comment: Performed at [facility], [address] [place], [place] [address]  Troponin I (High Sensitivity)     Status: None   Collection Time: 05/28/20  9:57 AM  Result Value Ref Range   Troponin I (High Sensitivity) 3 <18 ng/L    Comment: (NOTE) Elevated high sensitivity troponin I (hsTnI) values and significant  changes across serial measurements may suggest ACS but many other  chronic and acute conditions are known to elevate hsTnI results.  Refer to the "Links" section for chest pain algorithms and additional  guidance. Performed at [facility], [address] [place], [place] [address]     DG Chest 2 View  Result Date: 05/28/2020 CLINICAL DATA:  59 year old female status post fall last night with central chest pain and back pain. EXAM: CHEST - 2 VIEW  COMPARISON:  Chest radiographs 05/23/2020 and earlier. FINDINGS: Portable AP semi upright view at 0923 hours. A right PICC line is now in place, tip at the level of the SVC just above the carina. Improved lung volumes. Allowing for portable technique the lungs are clear. Mediastinal contours are within normal limits. Visualized tracheal air column is within normal limits. No pneumothorax or pleural effusion. No acute osseous abnormality identified.  Paucity of bowel gas. IMPRESSION: No acute cardiopulmonary abnormality or acute traumatic injury identified. Electronically Signed   By: Odessa Fleming M.D.   On: 05/28/2020 09:50   DG Thoracic Spine 2 View  Result Date: 05/28/2020 CLINICAL DATA:  59 year old female status post fall last night with central chest pain and back pain. EXAM: THORACIC SPINE 2 VIEWS COMPARISON:  Portable chest radiograph today.  CTA chest 05/16/2020. FINDINGS: Normal thoracic segmentation. Stable thoracic vertebral height and alignment from the CTA earlier this month. Chronic L1 inferior endplate deformity and sclerosis appears stable. Relatively preserved thoracic disc spaces. Multilevel mild mid and lower thoracic endplate spurring. Grossly intact visible posterior ribs. No acute osseous abnormality identified. Right PICC line redemonstrated. IMPRESSION: 1. No acute osseous abnormality identified in the thoracic spine. 2. Chronic L1 inferior endplate deformity appears stable. Electronically Signed   By: Odessa Fleming M.D.   On: 05/28/2020 09:53   DG Lumbar Spine Complete  Result Date: 05/28/2020 CLINICAL DATA:  60 year old female status post fall last night with central chest pain and back pain. EXAM: LUMBAR SPINE - COMPLETE 4+ VIEW COMPARISON:  Thoracic spine radiographs today. Lumbar radiographs 06/28/2018. [facility] Chest CTA 05/16/2020. FINDINGS: Normal lumbar segmentation. Chronic L1 inferior endplate deformity with associated endplate sclerosis and  adjacent vacuum disc appears stable from the CTA earlier this month. Stable lumbar vertebral height and alignment elsewhere. No pars fracture. No acute osseous abnormality identified. Visible sacrum and SI joints appear intact. Negative abdominal visceral contours. IMPRESSION: 1. No acute osseous abnormality identified in the lumbar spine. 2. Chronic L1 inferior endplate deformity with L1-L2 vacuum disc. Electronically Signed   By: Odessa Fleming M.D.   On: 05/28/2020 09:56   DG Ankle 2 Views Left  Result Date: 05/27/2020 CLINICAL DATA:  Left ankle deformity following fall EXAM: LEFT ANKLE - 2 VIEW COMPARISON:  None. FINDINGS: Two view radiograph left ankle demonstrates a a bimalleolar fracture dislocation of the left  ankle. An oblique fracture seen of the distal left fibular metaphysis extending to the level of the tibial plafond and with 1 shaft with posterior displacement, roughly 1 cm override, and roughly 30 degrees posterior angulation of the distal fracture fragment. There is a fracture of the posterior malleolus of the distal tibia with roughly 8 mm override and 5 mm posterior displacement of the fracture fragment. There is posterior and lateral dislocation of the talar dome in relation to the in tibial plafond with resultant widening of the medial joint space in keeping with injury to the deltoid ligament. Extensive surrounding soft tissue swelling. Small plantar calcaneal spur. Moderate midfoot degenerative arthritis noted. IMPRESSION: 1. Bimalleolar fracture dislocation of the left ankle as described above. Posterolateral tibiotalar dislocation. Electronically Signed   By: Helyn Numbers MD   On: 05/27/2020 22:34    Review of Systems  HENT: Negative for ear discharge, ear pain, hearing loss and tinnitus.   Eyes: Negative for photophobia and pain.  Respiratory: Negative for cough and shortness of breath.   Cardiovascular: Negative for chest pain.  Gastrointestinal: Negative for abdominal pain, nausea  and vomiting.  Genitourinary: Negative for dysuria, flank pain, frequency and urgency.  Musculoskeletal: Positive for arthralgias (Left ankle). Negative for back pain, myalgias and neck pain.  Neurological: Negative for dizziness and headaches.  Hematological: Does not bruise/bleed easily.  Psychiatric/Behavioral: The patient is not nervous/anxious.    Blood pressure (!) 151/62, pulse 83, temperature 98.1 F (36.7 C), temperature source Oral, resp. rate 20, height 5\' 6"  (1.676 m), weight (!) 152 kg, SpO2 99 %. Physical Exam Constitutional:      General: She is not in acute distress.    Appearance: She is well-developed. She is not diaphoretic.  HENT:     Head: Normocephalic and atraumatic.  Eyes:     General: No scleral icterus.       Right eye: No discharge.        Left eye: No discharge.     Conjunctiva/sclera: Conjunctivae normal.  Cardiovascular:     Rate and Rhythm: Normal rate and regular rhythm.  Pulmonary:     Effort: Pulmonary effort is normal. No respiratory distress.  Musculoskeletal:     Cervical back: Normal range of motion.     Comments: LLE No traumatic wounds, ecchymosis, or rash  Short leg splint in place  No knee effusion  Knee stable to varus/ valgus and anterior/posterior stress  Sens DPN, SPN, TN absent  Motor EHL 5/5  Toes perfused  Skin:    General: Skin is warm and dry.  Neurological:     Mental Status: She is alert.  Psychiatric:        Behavior: Behavior normal.     Assessment/Plan: Left ankle fx -- s/p CR by EDP, awaiting post-reduction films. As long as reduction adequate will transfer to [facility] for cardiac clearance and ORIF tomorrow or Thursday. NWB. Multiple medical problems including COPD, IDDM, CHF, NSTEMI, morbid obesity -- per primary service    Friday, PA-C Orthopedic Surgery [phone] 05/28/2020, 10:49 AM

## 2020-05-28 NOTE — ED Notes (Signed)
Report has been called to [name] at [facility], Delice Bison from [facility] notified of transfer.  Patient transferred with belongings, a/o x4.

## 2020-05-28 NOTE — ED Notes (Signed)
Ortho at bedside with MD splinting left foot.

## 2020-05-28 NOTE — Progress Notes (Signed)
Pt refusing cpap for the night. ?

## 2020-05-28 NOTE — Progress Notes (Signed)
Spoke with pt regarding cpap.  Pt stated she does not have/wear cpap at home.  Per pt, she was told by her doctor that she "had a little sleep apnea" but prescribed O2 instead of a cpap machine for home use.  Pt declined cpap for tonight, prefers to wear nasal cannula instead.  Pt was encouraged to call should she change her mind.

## 2020-05-28 NOTE — Plan of Care (Signed)

## 2020-05-28 NOTE — H&P (Signed)
History and Physical    [name] WUJ:[id number] DOB: 02/05/1961 DOA: 05/28/2020  PCP: Wilmer Floor., MD Patient coming from: Home  Chief Complaint: Left ankle pain  HPI: [name] is a 59 y.o. female with history of mild nonobstructive CAD, COPD not on oxygen, diastolic CHF, IDDM-2 with neuropathy, morbid obesity, depression, tobacco use and recent hospitalization [facility] from 8/12-8/22 for right great toe osteomyelitis, non-STEMI and pulmonary edema presenting to ED due to left ankle pain after fall at home.  Patient was hospitalized at [facility] for right great toe osteomyelitis.  During hospitalization, she developed chest pain and had non-STEMI.  She underwent left heart catheterization which was reported as normal coronaries with mild CAD.  She also developed acute hypoxic respiratory failure with pulmonary edema.  Echo reported as EF of 60 to 65% with no wall motion abnormalities.  Patient underwent right great toe amputation by podiatry.  Blood culture and surgical cultures were reportedly negative.  Wound cultures were positive for beta-hemolytic group B Streptococcus.. She was discharged on IV ceftriaxone via PICC line for 4 more weeks.   Patient was fixing a sandwich about 7 PM last night when she turned around and fell down on ground.  She felt severe ankle pain right away.  She reports 9/10 pain in her left ankle now.  She does not recall hitting her head.  She denies loss of consciousness.  She also felt some mid back pain.  He denies any prodrome leading to her fall.  She denies chest pain, dyspnea, palpitation, dizziness, lightheadedness, fever, chills, GI, UTI or focal neuro symptoms other than her chronic neuropathy.  Patient lives alone.  Has been using walker since discharge from the hospital on 8/22.  She smoked cigarettes since she was 59 years of age.  Currently smokes about 4 to 5 cigarettes a day.  She denies drinking  alcohol recreational drug use.  In ED, hemodynamically stable. Na 132.  Glucose 223. Cr 1.09 (baseline 0.9)..  WBC 14.2.  Hgb 11.3 (baseline 9-10)..  Left ankle x-ray and CT of left ankle revealed bimalleolar fractures with dislocation and posterolateral tibiotalar dislocation.  Lumbar and thoracic x-ray without acute finding.  Chest x-ray without acute finding.  COVID-19 PCR negative.  High-sensitivity troponin 3.  Orthopedic surgery consulted.  A splint and Ace wrap applied.  Orthopedic surgery recommended admission to [facility] for ORIF on 05/29/2020 after cardiac clearance.    ROS All review of system negative except for pertinent positives and negatives as history of present illness above.  PMH Past Medical History:  Diagnosis Date  . Anemia    iron deficiency  . Anxiety   . COPD (chronic obstructive pulmonary disease) (HCC)   . Depression   . Diabetes mellitus    type 2  . Diabetic foot ulcer (HCC) 04/2018   right ankle  . GERD (gastroesophageal reflux disease)   . Headache(784.0)   . History of colonic polyps   . Hyperlipidemia   . Hypertension   . Neuropathy   . Peptic ulcer disease    PSH Past Surgical History:  Procedure Laterality Date  . ESOPHAGOGASTRODUODENOSCOPY  10/11/2011   Procedure: ESOPHAGOGASTRODUODENOSCOPY (EGD);  Surgeon: Erick Blinks, MD;  [facility] ENDOSCOPY;  Service: Gastroenterology;  Laterality: N/A;  . TUBAL LIGATION     Fam HX Family History  Problem Relation Age of Onset  . Alcohol abuse Other   . Drug abuse Other   . Arthritis Other   .  Diabetes Other   . Depression Other   . Hypertension Other   . Kidney disease Other   . Cancer Other        Breast,Colon,Lung, and Prostate Cancer  . Stomach cancer Father 72  . Heart attack Brother 51  . Diabetes type II Other        brother, both parents   Social Hx  reports that she has been smoking cigarettes. She has a 25.00 pack-year smoking history. She has never used smokeless tobacco. She  reports that she does not drink alcohol and does not use drugs.  Allergy Allergies  Allergen Reactions  . Ceftriaxone Diarrhea    Extreme vomiting and diarrhea   Home Meds Prior to Admission medications   Medication Sig Start Date End Date Taking? Authorizing Provider  acetaminophen (TYLENOL) 500 MG tablet Take 1,000-1,050 mg by mouth daily as needed for moderate pain.    [provider]  Amino Acids-Protein Hydrolys (FEEDING SUPPLEMENT, PRO-STAT SUGAR FREE 64,) LIQD Take 30 mLs by mouth 2 (two) times daily. 04/27/18   Mikhail, Nita Sells, DO  amLODipine (NORVASC) 5 MG tablet Take 5 mg by mouth daily. 03/11/18   [provider]  amoxicillin-clavulanate (AUGMENTIN) 875-125 MG tablet Take 1 tablet by mouth every 12 (twelve) hours. 04/27/18   Mikhail, Nita Sells, DO  buPROPion (WELLBUTRIN SR) 200 MG 12 hr tablet Take 200 mg by mouth 2 (two) times daily. 04/16/18   [provider]  collagenase (SANTYL) ointment Apply topically daily. Apply Santyl to right plantar foot Q day, then cover with moist gauze and dry abd pad and kerlex. Apply to right heel ulcer plus dry dressing 04/28/18   Edsel Petrin, DO  collagenase [facility]) ointment Apply to wound 3 times/week with dry dressing 04/28/18   Nadara Mustard, MD  doxycycline (VIBRA-TABS) 100 MG tablet Take 1 tablet (100 mg total) by mouth every 12 (twelve) hours. 04/27/18   Mikhail, Nita Sells, DO  DULoxetine (CYMBALTA) 30 MG capsule Take 30 mg by mouth daily. 04/16/18   [provider]  DULoxetine (CYMBALTA) 60 MG capsule Take 60 mg by mouth daily.     [provider]  fluticasone (FLONASE) 50 MCG/ACT nasal spray Place 2 sprays into both nostrils daily. 02/16/18   [provider]  HUMALOG KWIKPEN 100 UNIT/ML KiwkPen Inject 0-25 Units as directed 3 (three) times daily as needed. Per sliding scale 02/10/18   [provider]  ibuprofen (ADVIL,MOTRIN) 200 MG tablet Take 600 mg by mouth 3 (three) times daily as  needed for moderate pain.    [provider]  insulin aspart (NOVOLOG FLEXPEN) 100 UNIT/ML injection Inject 10-25 Units into the skin 3 (three) times daily before meals. As directed per sliding scale 02/10/11 02/10/12  Etta Grandchild, MD  insulin glargine (LANTUS) 100 UNIT/ML injection Inject 80 Units into the skin 2 (two) times daily.     [provider]  INVOKANA 300 MG TABS tablet Take 300 mg by mouth daily. 03/19/18   [provider]  LATUDA 20 MG TABS tablet Take 20 mg by mouth at bedtime. 04/16/18   [provider]  LYRICA 225 MG capsule Take 675 mg by mouth at bedtime. 04/09/18   [provider]  meloxicam (MOBIC) 7.5 MG tablet Take 7.5 mg by mouth at bedtime. 03/29/18   [provider]  metFORMIN (GLUCOPHAGE) 1000 MG tablet Take 1,000 mg by mouth 2 (two) times daily with a meal.    [provider]  metoprolol tartrate (LOPRESSOR)  50 MG tablet Take 50 mg by mouth 2 (two) times daily. 02/10/18   [provider]  nystatin (MYCOSTATIN) 100000 UNIT/ML suspension Take 10 mLs by mouth 4 (four) times daily. Pt. States they do not measure their dose and hat they "just takes a swig" 03/21/18   [provider]  omeprazole (PRILOSEC) 40 MG capsule Take 40 mg by mouth daily. 03/19/18   [provider]  QUEtiapine (SEROQUEL) 100 MG tablet Take 100 mg by mouth at bedtime. 04/16/18   [provider]  SYMBICORT 80-4.5 MCG/ACT inhaler Inhale 2 puffs into the lungs 2 (two) times daily. 03/19/18   [provider]  VICTOZA 18 MG/3ML SOPN Inject into the skin daily. 1.8 ml 02/10/18   [provider]    Physical Exam: Vitals:   05/27/20 2215 05/27/20 2220 05/28/20 0735 05/28/20 1000  BP:  (!) 127/54 131/64 (!) 151/62  Pulse:  79 83 83  Resp:  18 19 20   Temp:  98.4 F (36.9 C) 98.1 F (36.7 C)   TempSrc:  Oral Oral   SpO2:  98% 98% 99%  Weight: (!) 152 kg     Height: 5\' 6"  (1.676 m)       GENERAL: No  acute distress.  Appears well.  HEENT: MMM.  Vision and hearing grossly intact.  NECK: Supple.  No apparent JVD.  RESP: 99% on RA.  No IWOB. Good air movement bilaterally. CVS:  RRR. Heart sounds normal.  ABD/GI/GU: Bowel sounds present. Soft. Non tender.  MSK/EXT:  Moves extremities.  Ace wrap over right foot.  Splint and Ace wrap over left foot and lower leg. SKIN: no apparent skin lesion or wound NEURO: Awake, alert and oriented appropriately.  No gross deficit.  PSYCH: Calm. Normal affect.   Personally Reviewed Radiological Exams DG Chest 2 View  Result Date: 05/28/2020 CLINICAL DATA:  59 year old female status post fall last night with central chest pain and back pain. EXAM: CHEST - 2 VIEW COMPARISON:  Chest radiographs 05/23/2020 and earlier. FINDINGS: Portable AP semi upright view at 0923 hours. A right PICC line is now in place, tip at the level of the SVC just above the carina. Improved lung volumes. Allowing for portable technique the lungs are clear. Mediastinal contours are within normal limits. Visualized tracheal air column is within normal limits. No pneumothorax or pleural effusion. No acute osseous abnormality identified.  Paucity of bowel gas. IMPRESSION: No acute cardiopulmonary abnormality or acute traumatic injury identified. Electronically Signed   By: 59 M.D.   On: 05/28/2020 09:50   DG Thoracic Spine 2 View  Result Date: 05/28/2020 CLINICAL DATA:  59 year old female status post fall last night with central chest pain and back pain. EXAM: THORACIC SPINE 2 VIEWS COMPARISON:  Portable chest radiograph today.  CTA chest 05/16/2020. FINDINGS: Normal thoracic segmentation. Stable thoracic vertebral height and alignment from the CTA earlier this month. Chronic L1 inferior endplate deformity and sclerosis appears stable. Relatively preserved thoracic disc spaces. Multilevel mild mid and lower thoracic endplate spurring. Grossly intact visible posterior ribs. No acute osseous  abnormality identified. Right PICC line redemonstrated. IMPRESSION: 1. No acute osseous abnormality identified in the thoracic spine. 2. Chronic L1 inferior endplate deformity appears stable. Electronically Signed   By: 59 M.D.   On: 05/28/2020 09:53   DG Lumbar Spine Complete  Result Date: 05/28/2020 CLINICAL DATA:  59 year old female status post fall last night with central chest pain and back pain. EXAM: LUMBAR SPINE - COMPLETE  4+ VIEW COMPARISON:  Thoracic spine radiographs today. Lumbar radiographs 06/28/2018. [facility] Chest CTA 05/16/2020. FINDINGS: Normal lumbar segmentation. Chronic L1 inferior endplate deformity with associated endplate sclerosis and adjacent vacuum disc appears stable from the CTA earlier this month. Stable lumbar vertebral height and alignment elsewhere. No pars fracture. No acute osseous abnormality identified. Visible sacrum and SI joints appear intact. Negative abdominal visceral contours. IMPRESSION: 1. No acute osseous abnormality identified in the lumbar spine. 2. Chronic L1 inferior endplate deformity with L1-L2 vacuum disc. Electronically Signed   By: Odessa Fleming M.D.   On: 05/28/2020 09:56   DG Ankle 2 Views Left  Result Date: 05/27/2020 CLINICAL DATA:  Left ankle deformity following fall EXAM: LEFT ANKLE - 2 VIEW COMPARISON:  None. FINDINGS: Two view radiograph left ankle demonstrates a a bimalleolar fracture dislocation of the left ankle. An oblique fracture seen of the distal left fibular metaphysis extending to the level of the tibial plafond and with 1 shaft with posterior displacement, roughly 1 cm override, and roughly 30 degrees posterior angulation of the distal fracture fragment. There is a fracture of the posterior malleolus of the distal tibia with roughly 8 mm override and 5 mm posterior displacement of the fracture fragment. There is posterior and lateral dislocation of the talar dome in relation to the in tibial  plafond with resultant widening of the medial joint space in keeping with injury to the deltoid ligament. Extensive surrounding soft tissue swelling. Small plantar calcaneal spur. Moderate midfoot degenerative arthritis noted. IMPRESSION: 1. Bimalleolar fracture dislocation of the left ankle as described above. Posterolateral tibiotalar dislocation. Electronically Signed   By: Helyn Numbers MD   On: 05/27/2020 22:34   DG Ankle Complete Left  Result Date: 05/28/2020 CLINICAL DATA:  Post reduction EXAM: LEFT ANKLE COMPLETE - 3+ VIEW COMPARISON:  05/27/2020 FINDINGS: Near complete interval reduction of a compound trimalleolar fracture dislocation of the left ankle mortise seen on prior examination. There is near anatomic apposition of the ankle mortise and fracture fragments, with mild, persistent displacement of the posterior malleolus. Midfoot arthrosis is again noted. Diffuse soft tissue edema about the foot and ankle. Cast material applied. IMPRESSION: Near complete interval reduction of a compound trimalleolar fracture dislocation of the left ankle mortise seen on prior examination. There is near anatomic apposition of the ankle mortise and fracture fragments, with mild, persistent displacement of the posterior malleolus. Electronically Signed   By: Lauralyn Primes M.D.   On: 05/28/2020 11:09   CT Ankle Left Wo Contrast  Result Date: 05/28/2020 CLINICAL DATA:  Evaluate left ankle fracture EXAM: CT OF THE LEFT ANKLE WITHOUT CONTRAST TECHNIQUE: Multidetector CT imaging of the left ankle was performed according to the standard protocol. Multiplanar CT image reconstructions were also generated. COMPARISON:  X-ray 05/28/2020, 12/11/2015 FINDINGS: Bones/Joint/Cartilage Acute obliquely oriented fracture of the distal left fibular metaphysis with minimal posterior displacement (series 8, image 40). Fracture line extends into the distal tibiofibular joint without definite extension into the ankle mortise. Tibiotalar  joint alignment is normal without widening. There is a small vertically oriented fracture of the posterior malleolus with triangular fracture fragment measuring 2.6 x 0.6 x 1.4 cm (series 8, image 29; series 3, image 41). There is 2 mm of proximal displacement resulting in slight articular surface depression at the posterior tibial plafond. Medial malleolus is intact without fracture. Tibiotalar joint is anatomically aligned without dislocation. Tiny 2 mm subchondral cyst at the medial talar shoulder suggesting a  small osteochondral lesion (series 7, image 52). Subtalar joints are intact without widening or dislocation. Calcaneus intact. Advanced arthropathy within the midfoot with prominent subchondral cystic changes across the naviculocuneiform articulations as well as the second and third TMT joints. There are multiple osseous densities along the dorsal aspect of the midfoot, some of which appear corticated (series 8, images 19-28). The largest fragment emanating from the dorsal aspect of the medial cuneiform measures 1.6 x 0.4 cm and could represent a acute or subacute osseous avulsion (series 8, image 19). Ligaments Suboptimally assessed by CT. There is mild widening between the first and second TMT joints (series 3, image 72) raising the suspicion for an underlying age indeterminate Lisfranc ligament injury. Muscles and Tendons Extensive fatty atrophy of the lower leg and foot musculature compatible with chronic denervation changes. No acute tendinous abnormality by CT. Soft tissues Soft tissue edema without organized hematoma or fluid collection. IMPRESSION: 1. Acute mildly displaced bimalleolar left ankle fractures involving the distal left fibular metaphysis and posterior malleolus. Anatomic alignment of the ankle mortise. 2. Mild widening between the first and second TMT joints raising the suspicion for an underlying age-indeterminate Lisfranc ligament injury. 3. Advanced arthropathy within the midfoot  with prominent subchondral cystic changes across the naviculocuneiform articulations as well as the second and third TMT joints. Multiple osseous densities along the dorsal aspect of the midfoot, some of which appear corticated. The largest fragment emanating from the dorsal aspect of the medial cuneiform measures 1.6 x 0.4 cm and could represent an acute or subacute fracture versus fragmentation related to degenerative or neuropathic/Charcot joint. 4. Tiny 2 mm subchondral cyst at the medial talar shoulder suggesting a small osteochondral lesion. 5. Extensive fatty atrophy of the lower leg and foot musculature compatible with chronic denervation changes. Electronically Signed   By: Duanne Guess D.O.   On: 05/28/2020 12:25     Personally Reviewed Labs: CBC: Recent Labs  Lab 05/28/20 0957  WBC 14.2*  HGB 11.3*  HCT 38.0  MCV 80.0  PLT 364   Basic Metabolic Panel: Recent Labs  Lab 05/28/20 0957  NA 132*  K 4.0  CL 93*  CO2 27  GLUCOSE 223*  BUN 18  CREATININE 1.09*  CALCIUM 8.8*   GFR: Estimated Creatinine Clearance: 86.7 mL/min (A) (by C-G formula based on SCr of 1.09 mg/dL (H)). Liver Function Tests: No results for input(s): AST, ALT, ALKPHOS, BILITOT, PROT, ALBUMIN in the last 168 hours. No results for input(s): LIPASE, AMYLASE in the last 168 hours. No results for input(s): AMMONIA in the last 168 hours. Coagulation Profile: No results for input(s): INR, PROTIME in the last 168 hours. Cardiac Enzymes: No results for input(s): CKTOTAL, CKMB, CKMBINDEX, TROPONINI in the last 168 hours. BNP (last 3 results) No results for input(s): PROBNP in the last 8760 hours. HbA1C: No results for input(s): HGBA1C in the last 72 hours. CBG: No results for input(s): GLUCAP in the last 168 hours. Lipid Profile: No results for input(s): CHOL, HDL, LDLCALC, TRIG, CHOLHDL, LDLDIRECT in the last 72 hours. Thyroid Function Tests: No results for input(s): TSH, T4TOTAL, FREET4, T3FREE,  THYROIDAB in the last 72 hours. Anemia Panel: No results for input(s): VITAMINB12, FOLATE, FERRITIN, TIBC, IRON, RETICCTPCT in the last 72 hours. Urine analysis:    Component Value Date/Time   COLORURINE YELLOW 10/08/2011 0746   APPEARANCEUR CLEAR 10/08/2011 0746   LABSPEC 1.038 (H) 10/08/2011 0746   PHURINE 5.5 10/08/2011 0746   GLUCOSEU >1000 (A) 10/08/2011 5329  GLUCOSEU >=1000 03/11/2011 1504   HGBUR SMALL (A) 10/08/2011 0746   BILIRUBINUR MODERATE (A) 10/08/2011 0746   KETONESUR >80 (A) 10/08/2011 0746   PROTEINUR NEGATIVE 10/08/2011 0746   UROBILINOGEN 0.2 10/08/2011 0746   NITRITE NEGATIVE 10/08/2011 0746   LEUKOCYTESUR NEGATIVE 10/08/2011 0746    Sepsis Labs:  None  Personally Reviewed EKG:  EKG pending.  Assessment/Plan Fall at home-mechanical Left ankle closed fracture Left ankle x-ray and CT revealed bimalleolar fractures with dislocations.  Splint and Ace wrap placed by orthopedic tech.  [facility]-Consulted cardiology for cardiac clearance as requested by orthopedic -Pain control with as needed Tylenol, oxycodone and Dilaudid -Bowel regimen as needed -Subcu heparin for VTE prophylaxis -N.p.o. after midnight  Right great toe osteomyelitis s/p amputation at [facility] on 05/21/2020.  Blood culture and surgical tissue cultures reportedly negative.  Wound culture with beta-hemolytic group B streptococcus.  Discharged on 05/26/2020 on IV ceftriaxone via PICC line for 4 weeks. -Continue IV ceftriaxone -Consult wound care  History of nonobstructive CAD/recent non-STEMI-[facility] reported as normal coronaries and mild nonobstructive CAD.  No anginal symptoms.  High-sensitivity troponin negative.  EKG pending. -Continue home medications after med rec  Chronic diastolic [facility] reported as EF of 60 to 65% and no regional wall motion abnormalities.  On p.o. Lasix.  No cardiopulmonary symptoms.  Appears euvolemica  but difficult exam due to body habitus. -Continue home Lasix after med rec -Monitor fluid status  Chronic COPD?: No formal PFT on file.  Smoked cigarettes since age of 59. -As needed nebulizer -We will resume home ICS/LABA.  Uncontrolled IDDM-2 with hyperglycemia and neuropathy: Recent A1c 8.5%.  Reports using Lantus 80 units twice daily with sliding scale insulin. -We will continue home Lantus at 15 units twice daily -SSI-moderate and NovoLog AC -Continue home statin -Continue home Lyrica after med rec  Essential hypertension: BP within fair range. -Resume home meds after med rec  Normocytic anemia: Hgb 11 (baseline 9-10). -Check anemia panel -Monitor  Anxiety/depression: Stable -Continue home medications after med rec  Morbid obesity Body mass index is 54.07 kg/m. -On Victoza -Encourage lifestyle change -Could benefit from bariatric surgery in the future    DVT prophylaxis: Subcu heparin  Code Status: Full code Family Communication: Attempted to call patient's daughter, Lorelle FormosaHanna per patient request but no answer.  Disposition Plan: [facility] Consults called: Orthopedic surgery and cardiology Admission status: Inpatient.   Almon Herculesaye T Maribeth Jiles MD [facility]  If 7PM-7AM, please contact night-coverage [email or web address]  05/28/2020, 12:43 PM

## 2020-05-28 NOTE — Progress Notes (Signed)
2Orthopedic Tech Progress Note Patient Details:  [name] 03/04/1961 [id number]  Ortho Devices Type of Ortho Device: Ace wrap, Short leg splint, Stirrup splint Ortho Device/Splint Location: left Ortho Device/Splint Interventions: Application   Post Interventions Patient Tolerated: Well Instructions Provided: Care of device   Saul Fordyce 05/28/2020, 11:03 AM

## 2020-05-28 NOTE — Consult Note (Addendum)
Cardiology Consultation:   Patient ID: [name] MRN: [id number]; DOB: 01/15/1961  Admit date: 05/28/2020 Date of Consult: 05/28/2020  Primary Care Provider: Wilmer Floorampbell, Stephen D., MD [facility] Cardiologist: No primary care provider on file.  [facility] Electrophysiologist:  None    Patient Profile:   [name] is a 59 y.o. female with a history of nonobstructive CAD, heart failure with preserved ejection fraction, IDDM, morbid obesity, COPD, tobacco use who is being seen today for preoperative evaluation at the request of Dr. Alanda SlimGonfa.  History of Present Illness:   [name] is a 59 year old female with a history of nonobstructive CAD, heart failure with preserved ejection fraction, IDDM, morbid obesity, hypertension, COPD, tobacco use who is being seen today for preoperative evaluation at the request of Dr. Alanda SlimGonfa.  She had a recent hospitalization at [facility].  She presented on 05/16/2020 with chest pain.  She was hypertensive to SBP 190s on presentation.  CTA was negative for PE.  Troponin was elevated at 170 > 207.  EKG reportedly unremarkable.  She was admitted for NSTEMI and started on heparin drip.  Subsequently became hypoxic, with CTA showing pulmonary edema but no PE or aortic dissection.  She was treated with IV Lasix and improved.  Echocardiogram on 05/17/2020 showed LVEF 60 to 65%.  She underwent cardiac catheterization on 05/17/2020, which showed essentially normal coronary arteries with minimal atherosclerosis.  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  Blood cultures were negative, wound cultures were positive for group B strep.  She was discharged on IV ceftriaxone.  She received diuresis with IV Lasix throughout hospitalization and was saturating well on room air on discharge.  She presented yesterday evening to the ED with ankle pain.  Reports she was fixing a sandwich when she turned and immediately  felt severe ankle pain and fell to the ground.  In the ED, she was afebrile, BP 127/54, SPO2 98% on room air, pulse 79.  Labs notable for creatinine 1.1, sodium 132, WBC 14.2, hemoglobin 11.3, high-sensitivity troponin was 3 > 4, Covid negative.  She was found to have ankle fracture on CT.  Orthopedic surgery consulted, with plan for ORIF on 8/25.   Past Medical History:  Diagnosis Date  . Anemia    iron deficiency  . Anxiety   . COPD (chronic obstructive pulmonary disease) (HCC)   . Depression   . Diabetes mellitus    type 2  . Diabetic foot ulcer (HCC) 04/2018   right ankle  . GERD (gastroesophageal reflux disease)   . Headache(784.0)   . History of colonic polyps   . Hyperlipidemia   . Hypertension   . Neuropathy   . Peptic ulcer disease     Past Surgical History:  Procedure Laterality Date  . ESOPHAGOGASTRODUODENOSCOPY  10/11/2011   Procedure: ESOPHAGOGASTRODUODENOSCOPY (EGD);  Surgeon: Erick BlinksJay Pyrtle, MD;  [facility];  Service: Gastroenterology;  Laterality: N/A;  . TUBAL LIGATION        Inpatient Medications: Scheduled Meds: . bacitracin  1 application Topical BID  . docusate sodium  100 mg Oral BID  . heparin  5,000 Units Subcutaneous Q8H  . insulin aspart  0-15 Units Subcutaneous TID WC  . insulin aspart  0-5 Units Subcutaneous QHS  . insulin aspart  6 Units Subcutaneous TID WC  . insulin glargine  50 Units Subcutaneous BID   Continuous Infusions:  PRN Meds: acetaminophen **OR** acetaminophen, albuterol, bisacodyl, HYDROmorphone (DILAUDID) injection, ondansetron **  OR** ondansetron (ZOFRAN) IV, oxyCODONE, senna-docusate, traZODone  Allergies:    Allergies  Allergen Reactions  . Ceftriaxone Diarrhea     vomiting and diarrhea    Social History:   Social History   Socioeconomic History  . Marital status: Divorced    Spouse name: Not on file  . Number of children: Not on file  . Years of education: Not on file  . Highest education level: Not on  file  Occupational History  . Not on file  Tobacco Use  . Smoking status: Current Every Day Smoker    Packs/day: 1.00    Years: 25.00    Pack years: 25.00    Types: Cigarettes  . Smokeless tobacco: Never Used  Vaping Use  . Vaping Use: Never used  Substance and Sexual Activity  . Alcohol use: No  . Drug use: No  . Sexual activity: Not Currently  Other Topics Concern  . Not on file  Social History Narrative   Patient is divorced and lives alone. She is on disability and has 3 daughter who she does not speak to often. She does not have any good friends and mostly stays at home. She has had multiple psychiatric admissions for depression and suicidality.   Social Determinants of Health   Financial Resource Strain:   . Difficulty of Paying Living Expenses: Not on file  Food Insecurity:   . Worried About Programme researcher, broadcasting/film/video in the Last Year: Not on file  . Ran Out of Food in the Last Year: Not on file  Transportation Needs:   . Lack of Transportation (Medical): Not on file  . Lack of Transportation (Non-Medical): Not on file  Physical Activity:   . Days of Exercise per Week: Not on file  . Minutes of Exercise per Session: Not on file  Stress:   . Feeling of Stress : Not on file  Social Connections:   . Frequency of Communication with Friends and Family: Not on file  . Frequency of Social Gatherings with Friends and Family: Not on file  . Attends Religious Services: Not on file  . Active Member of Clubs or Organizations: Not on file  . Attends Banker Meetings: Not on file  . Marital Status: Not on file  Intimate Partner Violence:   . Fear of Current or Ex-Partner: Not on file  . Emotionally Abused: Not on file  . Physically Abused: Not on file  . Sexually Abused: Not on file    Family History:    Family History  Problem Relation Age of Onset  . Alcohol abuse Other   . Drug abuse Other   . Arthritis Other   . Diabetes Other   . Depression Other   .  Hypertension Other   . Kidney disease Other   . Cancer Other        Breast,Colon,Lung, and Prostate Cancer  . Stomach cancer Father 36  . Heart attack Brother 51  . Diabetes type II Other        brother, both parents     ROS:  Please see the history of present illness.   All other ROS reviewed and negative.     Physical Exam/Data:   Vitals:   05/27/20 2220 05/28/20 0735 05/28/20 1000 05/28/20 1300  BP: (!) 127/54 131/64 (!) 151/62 (!) 181/79  Pulse: 79 83 83 96  Resp: Temp: 98.4 F (36.9 C) 98.1 F (36.7 C)    TempSrc: Oral Oral  SpO2: 98% 98% 99% 91%  Weight:      Height:       No intake or output data in the 24 hours ending 05/28/20 1602 Last 3 Weights 05/27/2020 04/27/2018 04/26/2018  Weight (lbs) 335 lb 368 lb 14.4 oz 368 lb 13.3 oz  Weight (kg) 151.955 kg 167.332 kg 167.3 kg     Body mass index is 54.07 kg/m. General:  in no acute distress HEENT: normal Lymph: no adenopathy Neck: no JVD appreciated but difficult to assess given habitus Cardiac:  normal S1, S2; RRR; 2/6 systolic murmur Lungs:  clear to auscultation bilaterally, no wheezing, rhonchi or rales  Abd: soft, nontender, no hepatomegaly  Ext: BLE in ACE wrap Musculoskeletal:  No deformities Skin: warm and dry  Neuro:   no focal abnormalities noted Psych:  Normal affect   EKG:  The EKG was personally reviewed and demonstrates:  No EKG Telemetry:  Telemetry was personally reviewed and demonstrates: Normal sinus rhythm, rate 80s to 90s  Relevant CV Studies:   Laboratory Data:  High Sensitivity Troponin:   Recent Labs  Lab 05/28/20 0957 05/28/20 1237  TROPONINIHS 3 4     Chemistry Recent Labs  Lab 05/28/20 0957  NA 132*  K 4.0  CL 93*  CO2 27  GLUCOSE 223*  BUN 18  CREATININE 1.09*  CALCIUM 8.8*  GFRNONAA 56*  GFRAA >60  ANIONGAP 12    No results for input(s): PROT, ALBUMIN, AST, ALT, ALKPHOS, BILITOT in the last 168 hours. Hematology Recent Labs  Lab  05/28/20 0957  WBC 14.2*  RBC 4.75  HGB 11.3*  HCT 38.0  MCV 80.0  MCH 23.8*  MCHC 29.7*  RDW 21.4*  PLT 364   BNPNo results for input(s): BNP, PROBNP in the last 168 hours.  DDimer No results for input(s): DDIMER in the last 168 hours.   Radiology/Studies:  DG Chest 2 View  Result Date: 05/28/2020 CLINICAL DATA:  59 year old female status post fall last night with central chest pain and back pain. EXAM: CHEST - 2 VIEW COMPARISON:  Chest radiographs 05/23/2020 and earlier. FINDINGS: Portable AP semi upright view at 0923 hours. A right PICC line is now in place, tip at the level of the SVC just above the carina. Improved lung volumes. Allowing for portable technique the lungs are clear. Mediastinal contours are within normal limits. Visualized tracheal air column is within normal limits. No pneumothorax or pleural effusion. No acute osseous abnormality identified.  Paucity of bowel gas. IMPRESSION: No acute cardiopulmonary abnormality or acute traumatic injury identified. Electronically Signed   By: Odessa Fleming M.D.   On: 05/28/2020 09:50   DG Thoracic Spine 2 View  Result Date: 05/28/2020 CLINICAL DATA:  59 year old female status post fall last night with central chest pain and back pain. EXAM: THORACIC SPINE 2 VIEWS COMPARISON:  Portable chest radiograph today.  CTA chest 05/16/2020. FINDINGS: Normal thoracic segmentation. Stable thoracic vertebral height and alignment from the CTA earlier this month. Chronic L1 inferior endplate deformity and sclerosis appears stable. Relatively preserved thoracic disc spaces. Multilevel mild mid and lower thoracic endplate spurring. Grossly intact visible posterior ribs. No acute osseous abnormality identified. Right PICC line redemonstrated. IMPRESSION: 1. No acute osseous abnormality identified in the thoracic spine. 2. Chronic L1 inferior endplate deformity appears stable. Electronically Signed   By: Odessa Fleming M.D.   On: 05/28/2020 09:53   DG Lumbar Spine  Complete  Result Date: 05/28/2020 CLINICAL DATA:  59 year old female status post fall last  night with central chest pain and back pain. EXAM: LUMBAR SPINE - COMPLETE 4+ VIEW COMPARISON:  Thoracic spine radiographs today. Lumbar radiographs 06/28/2018. [facility] Chest CTA 05/16/2020. FINDINGS: Normal lumbar segmentation. Chronic L1 inferior endplate deformity with associated endplate sclerosis and adjacent vacuum disc appears stable from the CTA earlier this month. Stable lumbar vertebral height and alignment elsewhere. No pars fracture. No acute osseous abnormality identified. Visible sacrum and SI joints appear intact. Negative abdominal visceral contours. IMPRESSION: 1. No acute osseous abnormality identified in the lumbar spine. 2. Chronic L1 inferior endplate deformity with L1-L2 vacuum disc. Electronically Signed   By: Odessa Fleming M.D.   On: 05/28/2020 09:56   DG Ankle 2 Views Left  Result Date: 05/27/2020 CLINICAL DATA:  Left ankle deformity following fall EXAM: LEFT ANKLE - 2 VIEW COMPARISON:  None. FINDINGS: Two view radiograph left ankle demonstrates a a bimalleolar fracture dislocation of the left ankle. An oblique fracture seen of the distal left fibular metaphysis extending to the level of the tibial plafond and with 1 shaft with posterior displacement, roughly 1 cm override, and roughly 30 degrees posterior angulation of the distal fracture fragment. There is a fracture of the posterior malleolus of the distal tibia with roughly 8 mm override and 5 mm posterior displacement of the fracture fragment. There is posterior and lateral dislocation of the talar dome in relation to the in tibial plafond with resultant widening of the medial joint space in keeping with injury to the deltoid ligament. Extensive surrounding soft tissue swelling. Small plantar calcaneal spur. Moderate midfoot degenerative arthritis noted. IMPRESSION: 1. Bimalleolar fracture dislocation  of the left ankle as described above. Posterolateral tibiotalar dislocation. Electronically Signed   By: Helyn Numbers MD   On: 05/27/2020 22:34   DG Ankle Complete Left  Result Date: 05/28/2020 CLINICAL DATA:  Post reduction EXAM: LEFT ANKLE COMPLETE - 3+ VIEW COMPARISON:  05/27/2020 FINDINGS: Near complete interval reduction of a compound trimalleolar fracture dislocation of the left ankle mortise seen on prior examination. There is near anatomic apposition of the ankle mortise and fracture fragments, with mild, persistent displacement of the posterior malleolus. Midfoot arthrosis is again noted. Diffuse soft tissue edema about the foot and ankle. Cast material applied. IMPRESSION: Near complete interval reduction of a compound trimalleolar fracture dislocation of the left ankle mortise seen on prior examination. There is near anatomic apposition of the ankle mortise and fracture fragments, with mild, persistent displacement of the posterior malleolus. Electronically Signed   By: Lauralyn Primes M.D.   On: 05/28/2020 11:09   CT Ankle Left Wo Contrast  Result Date: 05/28/2020 CLINICAL DATA:  Evaluate left ankle fracture EXAM: CT OF THE LEFT ANKLE WITHOUT CONTRAST TECHNIQUE: Multidetector CT imaging of the left ankle was performed according to the standard protocol. Multiplanar CT image reconstructions were also generated. COMPARISON:  X-ray 05/28/2020, 12/11/2015 FINDINGS: Bones/Joint/Cartilage Acute obliquely oriented fracture of the distal left fibular metaphysis with minimal posterior displacement (series 8, image 40). Fracture line extends into the distal tibiofibular joint without definite extension into the ankle mortise. Tibiotalar joint alignment is normal without widening. There is a small vertically oriented fracture of the posterior malleolus with triangular fracture fragment measuring 2.6 x 0.6 x 1.4 cm (series 8, image 29; series 3, image 41). There is 2 mm of proximal displacement resulting in  slight articular surface depression at the posterior tibial plafond. Medial malleolus is intact without fracture. Tibiotalar joint is anatomically aligned without  dislocation. Tiny 2 mm subchondral cyst at the medial talar shoulder suggesting a small osteochondral lesion (series 7, image 52). Subtalar joints are intact without widening or dislocation. Calcaneus intact. Advanced arthropathy within the midfoot with prominent subchondral cystic changes across the naviculocuneiform articulations as well as the second and third TMT joints. There are multiple osseous densities along the dorsal aspect of the midfoot, some of which appear corticated (series 8, images 19-28). The largest fragment emanating from the dorsal aspect of the medial cuneiform measures 1.6 x 0.4 cm and could represent a acute or subacute osseous avulsion (series 8, image 19). Ligaments Suboptimally assessed by CT. There is mild widening between the first and second TMT joints (series 3, image 72) raising the suspicion for an underlying age indeterminate Lisfranc ligament injury. Muscles and Tendons Extensive fatty atrophy of the lower leg and foot musculature compatible with chronic denervation changes. No acute tendinous abnormality by CT. Soft tissues Soft tissue edema without organized hematoma or fluid collection. IMPRESSION: 1. Acute mildly displaced bimalleolar left ankle fractures involving the distal left fibular metaphysis and posterior malleolus. Anatomic alignment of the ankle mortise. 2. Mild widening between the first and second TMT joints raising the suspicion for an underlying age-indeterminate Lisfranc ligament injury. 3. Advanced arthropathy within the midfoot with prominent subchondral cystic changes across the naviculocuneiform articulations as well as the second and third TMT joints. Multiple osseous densities along the dorsal aspect of the midfoot, some of which appear corticated. The largest fragment emanating from the dorsal  aspect of the medial cuneiform measures 1.6 x 0.4 cm and could represent an acute or subacute fracture versus fragmentation related to degenerative or neuropathic/Charcot joint. 4. Tiny 2 mm subchondral cyst at the medial talar shoulder suggesting a small osteochondral lesion. 5. Extensive fatty atrophy of the lower leg and foot musculature compatible with chronic denervation changes. Electronically Signed   By: Duanne Guess D.O.   On: 05/28/2020 12:25    Assessment and Plan:   Preop evaluation: Prior to ankle surgery.  She underwent recent work-up for chest pain earlier this month at [facility].  Cardiac catheterization showed minimal CAD.  Echocardiogram showed normal LV systolic function, no significant valvular disease.  As she has had recent negative cardiac work-up, no further cardiac work-up recommended prior to her surgery.  Chronic diastolic heart failure: Required IV Lasix during recent admission in [facility].  Difficult to assess volume status on exam, but does not appear grossly volume overloaded.  Can continue oral Lasix.  For questions or updates, please contact [facility] Please consult [email or web address] for contact info under    Signed, Little Ishikawa, MD  05/28/2020 4:02 PM

## 2020-05-28 NOTE — Progress Notes (Signed)
Reduction looks appropriate.  Plan for ORIF with Dr. Jena Gauss tomorrow.    [name] Post, MD

## 2020-05-28 NOTE — ED Notes (Signed)
Patient received dinner tray 

## 2020-05-29 ENCOUNTER — Encounter (HOSPITAL_COMMUNITY): Payer: Self-pay | Admitting: Student

## 2020-05-29 ENCOUNTER — Inpatient Hospital Stay (HOSPITAL_COMMUNITY): Payer: Medicare Other | Admitting: Certified Registered"

## 2020-05-29 ENCOUNTER — Encounter (HOSPITAL_COMMUNITY)
Admission: EM | Disposition: A | Discharge status: Skilled Nursing Facility | Payer: Self-pay | Source: Home / Self Care | Attending: Internal Medicine

## 2020-05-29 ENCOUNTER — Inpatient Hospital Stay (HOSPITAL_COMMUNITY): Payer: Medicare Other

## 2020-05-29 HISTORY — PX: ORIF ANKLE FRACTURE: SHX5408

## 2020-05-29 LAB — GLUCOSE, CAPILLARY
Glucose-Capillary: 164 mg/dL — ABNORMAL HIGH (ref 70–99)
Glucose-Capillary: 156 mg/dL — ABNORMAL HIGH (ref 70–99)
Glucose-Capillary: 166 mg/dL — ABNORMAL HIGH (ref 70–99)
Glucose-Capillary: 210 mg/dL — ABNORMAL HIGH (ref 70–99)
Glucose-Capillary: 460 mg/dL — ABNORMAL HIGH (ref 70–99)

## 2020-05-29 LAB — SURGICAL PCR SCREEN
MRSA, PCR: NEGATIVE
Staphylococcus aureus: NEGATIVE

## 2020-05-29 LAB — HEMOGLOBIN A1C
Hgb A1c MFr Bld: 8.7 % — ABNORMAL HIGH (ref 4.8–5.6)
Mean Plasma Glucose: 203 mg/dL

## 2020-05-29 SURGERY — OPEN REDUCTION INTERNAL FIXATION (ORIF) ANKLE FRACTURE
Anesthesia: Regional | Site: Ankle | Laterality: Left

## 2020-05-29 MED ORDER — DEXAMETHASONE SODIUM PHOSPHATE 10 MG/ML IJ SOLN
INTRAMUSCULAR | Status: DC | PRN
  Administered 2020-05-29: 4 mg via INTRAVENOUS

## 2020-05-29 MED ORDER — HYDROMORPHONE HCL 1 MG/ML IJ SOLN: 0.2500 mg | INTRAMUSCULAR | Status: DC | PRN

## 2020-05-29 MED ORDER — FENTANYL CITRATE (PF) 100 MCG/2ML IJ SOLN
INTRAMUSCULAR | Status: AC
  Filled 2020-05-29: qty 2

## 2020-05-29 MED ORDER — ONDANSETRON HCL 4 MG/2ML IJ SOLN: 4.0000 mg | Freq: Once | INTRAMUSCULAR | Status: DC | PRN

## 2020-05-29 MED ORDER — POLYETHYLENE GLYCOL 3350 17 G PO PACK: 17.0000 g | PACK | Freq: Every day | ORAL | Status: DC | PRN

## 2020-05-29 MED ORDER — CEFAZOLIN SODIUM-DEXTROSE 2-4 GM/100ML-% IV SOLN
INTRAVENOUS | Status: AC
  Filled 2020-05-29: qty 100

## 2020-05-29 MED ORDER — INSULIN ASPART 100 UNIT/ML ~~LOC~~ SOLN
10.0000 [IU] | Freq: Once | SUBCUTANEOUS | Status: AC
  Administered 2020-05-29: 10 [IU] via SUBCUTANEOUS

## 2020-05-29 MED ORDER — FENTANYL CITRATE (PF) 100 MCG/2ML IJ SOLN
INTRAMUSCULAR | Status: DC | PRN
Start: 2020-05-29 — End: 2020-05-29
  Administered 2020-05-29: 50 ug via INTRAVENOUS

## 2020-05-29 MED ORDER — DEXAMETHASONE SODIUM PHOSPHATE 10 MG/ML IJ SOLN
INTRAMUSCULAR | Status: AC
  Filled 2020-05-29: qty 1

## 2020-05-29 MED ORDER — VANCOMYCIN HCL 1000 MG IV SOLR
INTRAVENOUS | Status: AC
  Filled 2020-05-29: qty 1000

## 2020-05-29 MED ORDER — CHLORHEXIDINE GLUCONATE 4 % EX LIQD: 60.0000 mL | Freq: Once | CUTANEOUS | Status: DC

## 2020-05-29 MED ORDER — ROPIVACAINE HCL 5 MG/ML IJ SOLN
INTRAMUSCULAR | Status: DC | PRN
  Administered 2020-05-29: 40 mL via PERINEURAL

## 2020-05-29 MED ORDER — ONDANSETRON HCL 4 MG/2ML IJ SOLN
INTRAMUSCULAR | Status: DC | PRN
  Administered 2020-05-29: 4 mg via INTRAVENOUS

## 2020-05-29 MED ORDER — LIDOCAINE 2% (20 MG/ML) 5 ML SYRINGE
INTRAMUSCULAR | Status: DC | PRN
  Administered 2020-05-29: 20 mg via INTRAVENOUS

## 2020-05-29 MED ORDER — DEXTROSE 5 % IV SOLN
3.0000 g | INTRAVENOUS | Status: AC
  Administered 2020-05-29: 3 g via INTRAVENOUS
  Filled 2020-05-29 (×2): qty 3000

## 2020-05-29 MED ORDER — LACTATED RINGERS IV SOLN: INTRAVENOUS | Status: DC | PRN

## 2020-05-29 MED ORDER — ENOXAPARIN SODIUM 40 MG/0.4ML ~~LOC~~ SOLN
40.0000 mg | SUBCUTANEOUS | Status: DC
  Administered 2020-05-30: 40 mg via SUBCUTANEOUS
  Filled 2020-05-29: qty 0.4

## 2020-05-29 MED ORDER — PROPOFOL 10 MG/ML IV BOLUS
INTRAVENOUS | Status: DC | PRN
  Administered 2020-05-29: 200 mg via INTRAVENOUS

## 2020-05-29 MED ORDER — 0.9 % SODIUM CHLORIDE (POUR BTL) OPTIME
TOPICAL | Status: DC | PRN
  Administered 2020-05-29: 1000 mL

## 2020-05-29 MED ORDER — CHLORHEXIDINE GLUCONATE 0.12 % MT SOLN: 15.0000 mL | Freq: Once | OROMUCOSAL | Status: AC

## 2020-05-29 MED ORDER — DEXAMETHASONE SODIUM PHOSPHATE 10 MG/ML IJ SOLN
INTRAMUSCULAR | Status: DC | PRN
  Administered 2020-05-29: 10 mg

## 2020-05-29 MED ORDER — METHOCARBAMOL 500 MG PO TABS
500.0000 mg | ORAL_TABLET | Freq: Four times a day (QID) | ORAL | Status: DC | PRN
  Administered 2020-05-31 – 2020-06-04 (×11): 500 mg via ORAL
  Filled 2020-05-29 (×11): qty 1

## 2020-05-29 MED ORDER — METHOCARBAMOL 1000 MG/10ML IJ SOLN
500.0000 mg | Freq: Four times a day (QID) | INTRAVENOUS | Status: DC | PRN
  Filled 2020-05-29: qty 5

## 2020-05-29 MED ORDER — LIDOCAINE 2% (20 MG/ML) 5 ML SYRINGE
INTRAMUSCULAR | Status: AC
  Filled 2020-05-29: qty 5

## 2020-05-29 MED ORDER — FENTANYL CITRATE (PF) 250 MCG/5ML IJ SOLN
INTRAMUSCULAR | Status: AC
  Filled 2020-05-29: qty 5

## 2020-05-29 MED ORDER — CHLORHEXIDINE GLUCONATE 0.12 % MT SOLN
OROMUCOSAL | Status: AC
  Administered 2020-05-29: 15 mL via OROMUCOSAL
  Filled 2020-05-29: qty 15

## 2020-05-29 MED ORDER — OXYCODONE HCL 5 MG PO TABS
5.0000 mg | ORAL_TABLET | ORAL | Status: DC | PRN
  Administered 2020-05-29: 5 mg via ORAL
  Administered 2020-05-29 – 2020-05-31 (×7): 10 mg via ORAL
  Filled 2020-05-29 (×2): qty 2
  Filled 2020-05-29: qty 1
  Filled 2020-05-29 (×5): qty 2

## 2020-05-29 MED ORDER — ACETAMINOPHEN 500 MG PO TABS
1000.0000 mg | ORAL_TABLET | Freq: Once | ORAL | Status: AC
  Administered 2020-05-29: 1000 mg via ORAL
  Filled 2020-05-29: qty 2

## 2020-05-29 MED ORDER — HYDROMORPHONE HCL 1 MG/ML IJ SOLN
1.0000 mg | INTRAMUSCULAR | Status: DC | PRN
  Administered 2020-05-29 – 2020-06-04 (×11): 1 mg via INTRAVENOUS
  Filled 2020-05-29 (×12): qty 1

## 2020-05-29 MED ORDER — CEFAZOLIN SODIUM-DEXTROSE 1-4 GM/50ML-% IV SOLN
INTRAVENOUS | Status: AC
  Filled 2020-05-29: qty 50

## 2020-05-29 MED ORDER — MIDAZOLAM HCL 2 MG/2ML IJ SOLN
INTRAMUSCULAR | Status: AC
  Filled 2020-05-29: qty 2

## 2020-05-29 MED ORDER — OXYCODONE HCL 5 MG/5ML PO SOLN: 5.0000 mg | Freq: Once | ORAL | Status: DC | PRN

## 2020-05-29 MED ORDER — CEFAZOLIN SODIUM-DEXTROSE 2-4 GM/100ML-% IV SOLN
2.0000 g | Freq: Three times a day (TID) | INTRAVENOUS | Status: AC
  Administered 2020-05-29 – 2020-05-30 (×3): 2 g via INTRAVENOUS
  Filled 2020-05-29 (×3): qty 100

## 2020-05-29 MED ORDER — ONDANSETRON HCL 4 MG/2ML IJ SOLN
INTRAMUSCULAR | Status: AC
  Filled 2020-05-29: qty 2

## 2020-05-29 MED ORDER — POTASSIUM CHLORIDE IN NACL 20-0.9 MEQ/L-% IV SOLN
INTRAVENOUS | Status: DC
  Filled 2020-05-29: qty 1000

## 2020-05-29 MED ORDER — PROPOFOL 1000 MG/100ML IV EMUL
INTRAVENOUS | Status: AC
  Filled 2020-05-29: qty 100

## 2020-05-29 MED ORDER — EPHEDRINE 5 MG/ML INJ
INTRAVENOUS | Status: AC
  Filled 2020-05-29: qty 10

## 2020-05-29 MED ORDER — CHLORHEXIDINE GLUCONATE CLOTH 2 % EX PADS
6.0000 | MEDICATED_PAD | Freq: Every day | CUTANEOUS | Status: DC
  Administered 2020-05-29 – 2020-06-04 (×7): 6 via TOPICAL

## 2020-05-29 MED ORDER — OXYCODONE HCL 5 MG PO TABS: 5.0000 mg | ORAL_TABLET | Freq: Once | ORAL | Status: DC | PRN

## 2020-05-29 MED ORDER — VANCOMYCIN HCL 1000 MG IV SOLR
INTRAVENOUS | Status: DC | PRN
  Administered 2020-05-29: 1 g

## 2020-05-29 MED ORDER — POVIDONE-IODINE 10 % EX SWAB
2.0000 "application " | Freq: Once | CUTANEOUS | Status: AC
  Administered 2020-05-29: 2 via TOPICAL

## 2020-05-29 SURGICAL SUPPLY — 72 items
BANDAGE ESMARK 6X9 LF (GAUZE/BANDAGES/DRESSINGS) ×1 IMPLANT
BIT DRILL QC 2.0 SHORT EVOS SM (DRILL) ×1 IMPLANT
BIT DRILL QC 2.5MM SHRT EVO SM (DRILL) ×1 IMPLANT
BNDG COHESIVE 4X5 TAN STRL (GAUZE/BANDAGES/DRESSINGS) ×2 IMPLANT
BNDG ELASTIC 4X5.8 VLCR STR LF (GAUZE/BANDAGES/DRESSINGS) ×2 IMPLANT
BNDG ELASTIC 6X5.8 VLCR STR LF (GAUZE/BANDAGES/DRESSINGS) ×2 IMPLANT
BNDG ESMARK 6X9 LF (GAUZE/BANDAGES/DRESSINGS) ×2
BRUSH SCRUB EZ PLAIN DRY (MISCELLANEOUS) ×4 IMPLANT
CHLORAPREP W/TINT 26 (MISCELLANEOUS) ×2 IMPLANT
COVER SURGICAL LIGHT HANDLE (MISCELLANEOUS) ×2 IMPLANT
COVER WAND RF STERILE (DRAPES) ×2 IMPLANT
DRAPE C-ARM 42X72 X-RAY (DRAPES) ×2 IMPLANT
DRAPE C-ARMOR (DRAPES) ×2 IMPLANT
DRAPE ORTHO SPLIT 77X108 STRL (DRAPES) ×4
DRAPE SURG ORHT 6 SPLT 77X108 (DRAPES) ×2 IMPLANT
DRAPE U-SHAPE 47X51 STRL (DRAPES) ×2 IMPLANT
DRILL QC 2.0 SHORT EVOS SM (DRILL) ×2
DRILL QC 2.5MM SHORT EVOS SM (DRILL) ×2
DRSG ADAPTIC 3X8 NADH LF (GAUZE/BANDAGES/DRESSINGS) IMPLANT
DRSG MEPITEL 4X7.2 (GAUZE/BANDAGES/DRESSINGS) ×2 IMPLANT
ELECT REM PT RETURN 9FT ADLT (ELECTROSURGICAL) ×2
ELECTRODE REM PT RTRN 9FT ADLT (ELECTROSURGICAL) ×1 IMPLANT
GAUZE SPONGE 4X4 12PLY STRL (GAUZE/BANDAGES/DRESSINGS) ×2 IMPLANT
GLOVE BIO SURGEON STRL SZ 6.5 (GLOVE) ×6 IMPLANT
GLOVE BIO SURGEON STRL SZ7.5 (GLOVE) ×6 IMPLANT
GLOVE BIOGEL PI IND STRL 6.5 (GLOVE) ×1 IMPLANT
GLOVE BIOGEL PI IND STRL 7.5 (GLOVE) ×1 IMPLANT
GLOVE BIOGEL PI INDICATOR 6.5 (GLOVE) ×1
GLOVE BIOGEL PI INDICATOR 7.5 (GLOVE) ×1
GOWN STRL REUS W/ TWL LRG LVL3 (GOWN DISPOSABLE) ×2 IMPLANT
GOWN STRL REUS W/TWL LRG LVL3 (GOWN DISPOSABLE) ×4
K-WIRE 1.6 (WIRE) ×6
K-WIRE FX150X1.6XTROC PNT (WIRE) ×3
KIT TURNOVER KIT B (KITS) ×2 IMPLANT
KWIRE FX150X1.6XTROC PNT (WIRE) ×3 IMPLANT
MANIFOLD NEPTUNE II (INSTRUMENTS) ×2 IMPLANT
NEEDLE HYPO 21X1.5 SAFETY (NEEDLE) IMPLANT
NEEDLE HYPO 25GX1X1/2 BEV (NEEDLE) ×2 IMPLANT
NS IRRIG 1000ML POUR BTL (IV SOLUTION) ×2 IMPLANT
PACK TOTAL JOINT (CUSTOM PROCEDURE TRAY) ×2 IMPLANT
PAD ARMBOARD 7.5X6 YLW CONV (MISCELLANEOUS) ×4 IMPLANT
PAD CAST 4YDX4 CTTN HI CHSV (CAST SUPPLIES) ×2 IMPLANT
PADDING CAST ABS 6INX4YD NS (CAST SUPPLIES) ×2
PADDING CAST ABS COTTON 6X4 NS (CAST SUPPLIES) ×2 IMPLANT
PADDING CAST COTTON 4X4 STRL (CAST SUPPLIES) ×4
PADDING CAST COTTON 6X4 STRL (CAST SUPPLIES) IMPLANT
PLATE POST FIB 7H 27 3.5 L (Plate) ×2 IMPLANT
SCREW CORT 2.7X16 STAR T8 EVOS (Screw) ×2 IMPLANT
SCREW CORT 2.7X22 T8 ST EVOS (Screw) ×2 IMPLANT
SCREW CORT 3.5X13MM (Screw) ×2 IMPLANT
SCREW CORT EVOS ST 3.5X12 (Screw) ×4 IMPLANT
SCREW CTX 3.5X48MM EVOS (Screw) ×4 IMPLANT
SCREW CTX 3.5X50MM EVOS (Screw) ×4 IMPLANT
SCREW EVOS 2.7X18 LOCK T8 (Screw) ×2 IMPLANT
SCREW LOCK ST EVOS 2.7X20 (Screw) ×4 IMPLANT
SPONGE LAP 18X18 RF (DISPOSABLE) IMPLANT
STAPLER VISISTAT 35W (STAPLE) ×2 IMPLANT
SUCTION FRAZIER HANDLE 10FR (MISCELLANEOUS) ×2
SUCTION TUBE FRAZIER 10FR DISP (MISCELLANEOUS) ×1 IMPLANT
SUT ETHILON 2 0 FS 18 (SUTURE) ×2 IMPLANT
SUT ETHILON 3 0 PS 1 (SUTURE) ×6 IMPLANT
SUT PROLENE 0 CT (SUTURE) IMPLANT
SUT VIC AB 0 CT1 27 (SUTURE) ×2
SUT VIC AB 0 CT1 27XBRD ANBCTR (SUTURE) ×1 IMPLANT
SUT VIC AB 2-0 CT1 27 (SUTURE) ×6
SUT VIC AB 2-0 CT1 TAPERPNT 27 (SUTURE) ×3 IMPLANT
SUT VICRYL 0 UR6 27IN ABS (SUTURE) ×2 IMPLANT
SYR CONTROL 10ML LL (SYRINGE) ×2 IMPLANT
TOWEL GREEN STERILE (TOWEL DISPOSABLE) ×4 IMPLANT
TOWEL GREEN STERILE FF (TOWEL DISPOSABLE) ×2 IMPLANT
UNDERPAD 30X36 HEAVY ABSORB (UNDERPADS AND DIAPERS) ×2 IMPLANT
WATER STERILE IRR 1000ML POUR (IV SOLUTION) ×2 IMPLANT

## 2020-05-29 NOTE — Anesthesia Procedure Notes (Signed)
Anesthesia Regional Block: Femoral nerve block   Pre-Anesthetic Checklist: ,, timeout performed, Correct Patient, Correct Site, Correct Laterality, Correct Procedure, Correct Position, site marked, Risks and benefits discussed,  Surgical consent,  Pre-op evaluation,  At surgeon's request and post-op pain management  Laterality: Left  Prep: Maximum Sterile Barrier Precautions used, chloraprep       Needles:  Injection technique: Single-shot  Needle Type: Echogenic Stimulator Needle     Needle Length: 9cm  Needle Gauge: 22     Additional Needles:   Procedures:,,,, ultrasound used (permanent image in chart),,,,  Narrative:  Start time: 05/29/2020 11:40 AM End time: 05/29/2020 11:45 AM Injection made incrementally with aspirations every 5 mL.  Performed by: Personally  Anesthesiologist: Lannie Fields, DO  Additional Notes: Monitors applied. No increased pain on injection. No increased resistance to injection. Injection made in 5cc increments. Good needle visualization. Patient tolerated procedure well.

## 2020-05-29 NOTE — Consult Note (Signed)
Orthopaedic Trauma Service (OTS) Consult   Patient ID: [name] MRN: [id number] DOB/AGE: [birth year] 59 y.o.  Reason for Consult:Left ankle fracture Referring Physician: Dr. Dorthula NettlesJosh Landau, MD [facility]  HPI: [name] is an 59 y.o. female who is being seen in consultation at the request of Dr. Dion SaucierLandau for evaluation of left ankle fracture.  The patient has a history of COPD, diabetes on insulin, peripheral neuropathy along with previous history of NSTEMI that presents after a twist and fall with a displaced left ankle fracture dislocation.  [facility] and a closed reduction was performed.  Due to the complexity of her injuries as well as the OR availability Dr. Dion SaucierLandau asked that I take and assume care of the patient.  Patient was seen and evaluated in preoperative holding area.  She has pain in her ankle.  She does note some peripheral neuropathy.  She denies any history of peripheral vascular disease.  She lives alone.  She ambulates without assist device.  She was using a walker after her toe amputation which was performed last Wednesday.  She has daughters that live in the area but they work.  She currently denies any other injuries.  She does endorse smoking.  She takes insulin at baseline and her hemoglobin A1c usually runs in the mid eights.  Past Medical History:  Diagnosis Date  . Anemia    iron deficiency  . Anxiety   . COPD (chronic obstructive pulmonary disease) (HCC)   . Depression   . Diabetes mellitus    type 2  . Diabetic foot ulcer (HCC) 04/2018   right ankle  . GERD (gastroesophageal reflux disease)   . Headache(784.0)   . History of colonic polyps   . Hyperlipidemia   . Hypertension   . Neuropathy   . Peptic ulcer disease     Past Surgical History:  Procedure Laterality Date  . ESOPHAGOGASTRODUODENOSCOPY  10/11/2011   Procedure: ESOPHAGOGASTRODUODENOSCOPY (EGD);  Surgeon: Erick BlinksJay Pyrtle, MD;  [facility];  Service:  Gastroenterology;  Laterality: N/A;  . TUBAL LIGATION      Family History  Problem Relation Age of Onset  . Alcohol abuse Other   . Drug abuse Other   . Arthritis Other   . Diabetes Other   . Depression Other   . Hypertension Other   . Kidney disease Other   . Cancer Other        Breast,Colon,Lung, and Prostate Cancer  . Stomach cancer Father [id number]  . Heart attack Brother 51  . Diabetes type II Other        brother, both parents    Social History:  reports that she has been smoking cigarettes. She has a 25.00 pack-year smoking history. She has never used smokeless tobacco. She reports that she does not drink alcohol and does not use drugs.  Allergies:  Allergies  Allergen Reactions  . Ceftriaxone Diarrhea     vomiting and diarrhea    Medications:  No current facility-administered medications on file prior to encounter.   Current Outpatient Medications on File Prior to Encounter  Medication Sig Dispense Refill  . acetaminophen (TYLENOL) 500 MG tablet Take 1,000-1,050 mg by mouth daily as needed for moderate pain.    Marland Kitchen. amLODipine (NORVASC) 10 MG tablet Take 10 mg by mouth daily.    Marland Kitchen. aspirin 81 MG EC tablet Take by mouth.    Marland Kitchen. atorvastatin (LIPITOR) 10 MG tablet Take 10 mg by mouth daily.    .Marland Kitchen  buPROPion (WELLBUTRIN SR) 200 MG 12 hr tablet Take 200 mg by mouth 2 (two) times daily.  0  . cefTRIAXone (ROCEPHIN) 2 g SOLR injection Inject 2 g into the vein daily.    . DULoxetine (CYMBALTA) 30 MG capsule Take 30 mg by mouth daily.  2  . DULoxetine (CYMBALTA) 60 MG capsule Take 60 mg by mouth daily.     . ferrous sulfate 325 (65 FE) MG tablet Take 325 mg by mouth daily with breakfast.    . fluticasone (FLONASE) 50 MCG/ACT nasal spray Place 2 sprays into both nostrils daily.  5  . furosemide (LASIX) 40 MG tablet Take 40 mg by mouth daily.    Marland Kitchen HUMALOG KWIKPEN 100 UNIT/ML KiwkPen Inject 0-25 Units as directed 3 (three) times daily as needed. Per sliding scale    . hydrALAZINE  (APRESOLINE) 25 MG tablet Take 1 tablet by mouth in the morning, at noon, and at bedtime.    Marland Kitchen ibuprofen (ADVIL,MOTRIN) 200 MG tablet Take 600 mg by mouth 3 (three) times daily as needed for moderate pain.    Marland Kitchen insulin glargine (LANTUS) 100 UNIT/ML injection Inject 80 Units into the skin 2 (two) times daily.     Marland Kitchen JARDIANCE 25 MG TABS tablet Take 25 mg by mouth daily.    Marland Kitchen KLOR-CON 20 MEQ packet Take 20 mEq by mouth daily.    Marland Kitchen LATUDA 20 MG TABS tablet Take 20 mg by mouth at bedtime.  0  . lidocaine (LIDODERM) 5 % Place 1 patch onto the skin daily.    Marland Kitchen LYRICA 225 MG capsule Take 675 mg by mouth at bedtime.    . meloxicam (MOBIC) 7.5 MG tablet Take 7.5 mg by mouth at bedtime.    . metoprolol tartrate (LOPRESSOR) 50 MG tablet Take 50 mg by mouth 2 (two) times daily.    . Multiple Vitamin (MULTI-DAY VITAMINS PO) Take 1 tablet by mouth daily.    Marland Kitchen nystatin (MYCOSTATIN) 100000 UNIT/ML suspension Take 10 mLs by mouth 4 (four) times daily. Pt. States they do not measure their dose and hat they "just takes a swig"  1  . omeprazole (PRILOSEC) 40 MG capsule Take 40 mg by mouth daily.    Marland Kitchen oxyCODONE-acetaminophen (PERCOCET) 7.5-325 MG tablet Take 1 tablet by mouth every 8 (eight) hours as needed.    Marland Kitchen QUEtiapine (SEROQUEL) 100 MG tablet Take 100 mg by mouth at bedtime.  0  . Semaglutide, 1 MG/DOSE, (OZEMPIC, 1 MG/DOSE,) 2 MG/1.5ML SOPN Inject 1 mg into the skin every 7 (seven) days.    . SYMBICORT 80-4.5 MCG/ACT inhaler Inhale 2 puffs into the lungs 2 (two) times daily.    . traZODone (DESYREL) 100 MG tablet Take 1 tablet by mouth at bedtime.    . Vitamin D, Ergocalciferol, (DRISDOL) 1.25 MG (50000 UNIT) CAPS capsule Take 50,000 Units by mouth once a week.    Marland Kitchen albuterol (VENTOLIN HFA) 108 (90 Base) MCG/ACT inhaler Inhale 2 puffs into the lungs every 6 (six) hours as needed.    . Amino Acids-Protein Hydrolys (FEEDING SUPPLEMENT, PRO-STAT SUGAR FREE 64,) LIQD Take 30 mLs by mouth 2 (two) times daily. 900 mL  0  . amoxicillin-clavulanate (AUGMENTIN) 875-125 MG tablet Take 1 tablet by mouth every 12 (twelve) hours. 28 tablet 0  . collagenase (SANTYL) ointment Apply to wound 3 times/week with dry dressing 15 g 3  . doxycycline (VIBRA-TABS) 100 MG tablet Take 1 tablet (100 mg total) by mouth every 12 (twelve) hours. 28  tablet 0  . insulin aspart (NOVOLOG FLEXPEN) 100 UNIT/ML injection Inject 10-25 Units into the skin 3 (three) times daily before meals. As directed per sliding scale 30 mL 3    ROS: Constitutional: No fever or chills Vision: No changes in vision ENT: No difficulty swallowing CV: No chest pain Pulm: No SOB or wheezing GI: No nausea or vomiting GU: No urgency or inability to hold urine Skin: No poor wound healing Neurologic: No numbness or tingling Psychiatric: No depression or anxiety Heme: No bruising Allergic: No reaction to medications or food   Exam: Blood pressure (!) 142/61, pulse 89, temperature (!) 97.5 F (36.4 C), temperature source Oral, resp. rate 15, height 5\' 6"  (1.676 m), weight (!) 152 kg, SpO2 96 %. General: No acute distress Orientation: Awake alert and oriented x3 Mood and Affect: Cooperative and pleasant Gait: Unable to assess due to her fracture Coordination and balance: Within normal limits  Left lower extremity: Splint is in place is clean dry and intact.  Compartments are soft compressible.  She has active dorsiflexion plantarflexion of her toes.  She has gross sensation intact to her toes with a warm well-perfused foot with brisk cap refill.  I did not take her splint down.  Right lower extremity: Dressing is in place that is clean and dry.  She is able to actively dorsiflex and plantarflex her ankle.  No deformity or instability.   Medical Decision Making: Data: Imaging: X-rays and CT scan reviewed which shows a left ankle fracture dislocation with lateral malleolus and posterior malleolus.  Postreduction CT shows a reduction of the tibiotalar  joint with down the overt intra-articular fragments.  Labs:  Results for orders placed or performed during the hospital encounter of 05/28/20 (from the past 24 hour(s))  Troponin I (High Sensitivity)     Status: None   Collection Time: 05/28/20 12:37 PM  Result Value Ref Range   Troponin I (High Sensitivity) 4 <18 ng/L  Hemoglobin A1c     Status: Abnormal   Collection Time: 05/28/20 12:45 PM  Result Value Ref Range   Hgb A1c MFr Bld 8.7 (H) 4.8 - 5.6 %   Mean Plasma Glucose 203 mg/dL  HIV Antibody (routine testing w rflx)     Status: None   Collection Time: 05/28/20 12:45 PM  Result Value Ref Range   HIV Screen 4th Generation wRfx Non Reactive Non Reactive  CBG monitoring, ED     Status: Abnormal   Collection Time: 05/28/20  4:25 PM  Result Value Ref Range   Glucose-Capillary 159 (H) 70 - 99 mg/dL  Surgical pcr screen     Status: None   Collection Time: 05/28/20  8:38 PM   Specimen: Nasal Mucosa; Nasal Swab  Result Value Ref Range   MRSA, PCR NEGATIVE NEGATIVE   Staphylococcus aureus NEGATIVE NEGATIVE  Glucose, capillary     Status: Abnormal   Collection Time: 05/28/20  9:02 PM  Result Value Ref Range   Glucose-Capillary 189 (H) 70 - 99 mg/dL  Glucose, capillary     Status: Abnormal   Collection Time: 05/29/20  6:32 AM  Result Value Ref Range   Glucose-Capillary 164 (H) 70 - 99 mg/dL  Glucose, capillary     Status: Abnormal   Collection Time: 05/29/20 10:23 AM  Result Value Ref Range   Glucose-Capillary 156 (H) 70 - 99 mg/dL    Imaging or Labs ordered: None  Medical history and chart was reviewed and case discussed with medical provider.  Assessment/Plan:  59 year old female with multiple medical problems including type 2 diabetes on insulin with peripheral neuropathy, COPD with a longstanding tobacco use, history of NSTEMI with a left ankle fracture dislocation.  Due to her unstable nature of her injury I recommend proceeding with surgical fixation of her ankle.  We  will plan for open reduction internal fixation depending on her soft tissue swelling and soft tissue envelope.  If there is any concern we will plan for temporizing external fixation with delayed ORIF.  I am concerned about her peripheral neuropathy in her diabetes.  She is at high risk for postoperative complications including infection, bleeding, nerve and blood vessel injury, posttraumatic arthritis and failed fixation.  She will likely be nonweightbearing for 4 to 6 weeks postoperatively.  I discussed all of this with her and she agrees to proceed with surgery and consent was obtained.  Roby Lofts, MD [facility] [phone] (office) [email or web address]

## 2020-05-29 NOTE — Anesthesia Procedure Notes (Signed)
Anesthesia Regional Block: Popliteal block   Pre-Anesthetic Checklist: ,, timeout performed, Correct Patient, Correct Site, Correct Laterality, Correct Procedure, Correct Position, site marked, Risks and benefits discussed,  Surgical consent,  Pre-op evaluation,  At surgeon's request and post-op pain management  Laterality: Left  Prep: Maximum Sterile Barrier Precautions used, chloraprep       Needles:  Injection technique: Single-shot  Needle Type: Echogenic Stimulator Needle     Needle Length: 9cm  Needle Gauge: 22     Additional Needles:   Procedures:,,,, ultrasound used (permanent image in chart),,,,  Narrative:  Start time: 05/29/2020 11:35 AM End time: 05/29/2020 11:40 AM Injection made incrementally with aspirations every 5 mL.  Performed by: Personally  Anesthesiologist: Lannie Fields, DO  Additional Notes: Monitors applied. No increased pain on injection. No increased resistance to injection. Injection made in 5cc increments. Good needle visualization. Patient tolerated procedure well.

## 2020-05-29 NOTE — Progress Notes (Signed)
RT offered pt CPAP for the night and pt declined stating she has no idea why they ordered it for her because she has never worn one and that she doesn't have issues breathing while sleeping. Pt respiratory status stable at this time w/no distress noted on RA. RT will continue to monitor.

## 2020-05-29 NOTE — Anesthesia Postprocedure Evaluation (Signed)
Anesthesia Post Note  Patient: [name]  Procedure(s) Performed: OPEN REDUCTION INTERNAL FIXATION (ORIF) ANKLE FRACTURE (Left Ankle)     Patient location during evaluation: PACU Anesthesia Type: Regional and General Level of consciousness: awake and alert, oriented and patient cooperative Pain management: pain level controlled Vital Signs Assessment: post-procedure vital signs reviewed and stable Respiratory status: spontaneous breathing, nonlabored ventilation and respiratory function stable Cardiovascular status: blood pressure returned to baseline and stable Postop Assessment: no apparent nausea or vomiting Anesthetic complications: no   No complications documented.  Last Vitals:  Vitals:   05/29/20 1345 05/29/20 1407  BP: (!) 137/57 (!) 141/58  Pulse: 78 81  Resp: 16 16  Temp: 36.9 C 36.8 C  SpO2: 96% 98%    Last Pain:  Vitals:   05/29/20 1407  TempSrc: Oral  PainSc: Asleep                 Lannie Fields

## 2020-05-29 NOTE — Anesthesia Procedure Notes (Signed)
Procedure Name: LMA Insertion Date/Time: 05/29/2020 12:03 PM Performed by: Shireen Quan, CRNA Pre-anesthesia Checklist: Patient identified, Emergency Drugs available, Suction available and Patient being monitored Patient Re-evaluated:Patient Re-evaluated prior to induction Oxygen Delivery Method: Circle System Utilized Preoxygenation: Pre-oxygenation with 100% oxygen Induction Type: IV induction Ventilation: Mask ventilation without difficulty LMA: LMA with gastric port inserted LMA Size: 4.0 Number of attempts: 1 Placement Confirmation: positive ETCO2 Tube secured with: Tape Dental Injury: Teeth and Oropharynx as per pre-operative assessment

## 2020-05-29 NOTE — Transfer of Care (Signed)
Immediate Anesthesia Transfer of Care Note  Patient: [name]  Procedure(s) Performed: OPEN REDUCTION INTERNAL FIXATION (ORIF) ANKLE FRACTURE (Left Ankle)  Patient Location: PACU  Anesthesia Type:GA combined with regional for post-op pain  Level of Consciousness: drowsy and patient cooperative  Airway & Oxygen Therapy: Patient Spontanous Breathing and Patient connected to nasal cannula oxygen  Post-op Assessment: Report given to RN and Post -op Vital signs reviewed and stable  Post vital signs: Reviewed and stable  Last Vitals:  Vitals Value Taken Time  BP 141/64 05/29/20 1326  Temp    Pulse 75 05/29/20 1328  Resp 13 05/29/20 1328  SpO2 100 % 05/29/20 1328  Vitals shown include unvalidated device data.  Last Pain:  Vitals:   05/29/20 1110  TempSrc:   PainSc: 8       Patients Stated Pain Goal: 3 (05/29/20 1110)  Complications: No complications documented.

## 2020-05-29 NOTE — Progress Notes (Signed)
PROGRESS NOTE    [name]  PPI:[id number] DOB: 29-Mar-1961 DOA: 05/28/2020 PCP: Wilmer Floor., MD    Brief Narrative:  [name] is a 59 y.o. female with history of mild nonobstructive CAD, COPD not on oxygen, diastolic CHF, IDDM-2 with neuropathy, morbid obesity, depression, tobacco use and recent hospitalization [facility] from 8/12-8/22 for right great toe osteomyelitis, non-STEMI and pulmonary edema presenting to ED due to left ankle pain after fall at home.  Patient was found to have bimalleolar fracture with dislocation of her left ankle.  Orthopedic surgery was consulted who recommended admission to [facility] for surgical intervention.  [facility] was consulted for admission.   Assessment & Plan:   Active Problems:   Insulin-requiring or dependent type II diabetes mellitus (HCC)   Morbid (severe) obesity due to excess calories (HCC)   TOBACCO USE   Depression with anxiety   Essential hypertension   Closed left ankle fracture   Left bimalleolar ankle fracture with dislocation, closed Patient presenting following mechanical fall at home with left ankle pain.  X-ray/CT left ankle notable for bimalleolar fracture with dislocation.  Patient underwent ORIF by orthopedics, Dr. Jena Gauss on 05/29/2020. --Nonweightbearing left lower extremity --DVT prophylaxis with Lovenox while inpatient with recommendation of aspirin 325 mg twice daily on discharge --Pain control with oxycodone 5-10 mg every 4 hours as needed --Dilaudid 1 mg IV every 3 hours as needed for severe breakthrough pain --Robaxin 500 mg every 6 hours as needed muscle spasms --PT/OT consultation  Right great toe osteomyelitis s/p amputation at [facility] on 05/21/2020.   Blood culture and surgical tissue cultures reportedly negative.  Wound culture with beta-hemolytic group B streptococcus.  Discharged on 05/26/2020 on IV ceftriaxone via PICC line for 4 weeks. --Continue IV ceftriaxone --Wound care  consulted  History of nonobstructive CAD/recent non-STEM LHC at [facility] on 05/17/2020 reported as normal coronaries and mild nonobstructive CAD.  No anginal symptoms.  High-sensitivity troponin negative.  --Continue metoprolol tartrate 50 mg p.o. twice daily  Chronic diastolic CHF:  Echo at [facility] reported as EF of 60 to 65% and no regional wall motion abnormalities.  On p.o. Lasix.  No cardiopulmonary symptoms.  Appears euvolemica but difficult exam due to body habitus. --Furosemide 40 mg p.o. daily --Strict I's and O's Daily weights  Chronic COPD?:  No formal PFT on file. Smoked cigarettes since age of 34. --Nebs as needed --Continue home ICS/LABA.  Uncontrolled IDDM-2 with hyperglycemia and neuropathy:  Recent A1c 8.5%.  Reports using Lantus 80 units twice daily with sliding scale insulin. --continue Lantus at reduced dose of 50u BID --NovoLog 6 units 3 times daily AC --SSI-moderate dose for further coverage --Continue home statin --Continue home Lyrica  Essential hypertension:  BP 142/61 this morning. --Amlodipine 10 mg p.o. daily --Hydralazine 25 mg every 8 hours --Furosemide 40 mg p.o. daily --Metoprolol tartrate 50 mg p.o. twice daily --Continue to closely monitor blood pressure  Normocytic anemia:  Hgb 11 (baseline 9-10).  --Follow CBC daily  Anxiety/depression:  --Duloxetine 30 mg p.o. daily --Latuda 20 mg p.o. daily --Trazodone 50 mg nightly --Seroquel 200 mg p.o. nightly  HLD: Continue atorvastatin 10 mg p.o. daily  Morbid obesity Body mass index is 54.07 kg/m.  Encouraged lifestyle changes/weight loss as this complicates all facets of care.  Could benefit from bariatric surgical evaluation in the future.  DVT prophylaxis: Lovenox Code Status: Full code Family Communication: No family present at bedside this morning  Disposition Plan:  Status is: Inpatient  Remains inpatient appropriate because:Ongoing active pain requiring inpatient pain management,  Ongoing diagnostic testing needed not appropriate for outpatient work up, Unsafe d/c plan and Inpatient level of care appropriate due to severity of illness   Dispo: The patient is from: Home              Anticipated d/c is to: To be determined following therapy evaluation              Anticipated d/c date is: 2 days              Patient currently is not medically stable to d/c.  Surgical intervention today    Consultants:   Orthopedics, Dr. Jena Gauss  Cardiology  Procedures:   ORIF left ankle fracture/dislocation 05/29/2020, Dr. Jena Gauss  Antimicrobials:   Perioperative vancomycin  Ceftriaxone   Subjective: Patient seen and examined bedside, resting comfortably.  Pending surgical intervention later this morning for ankle fracture.  Pain currently controlled.  No other complaints or concerns at this time.  Denies headache, no fever/chills/night sweats, no nausea/vomiting/diarrhea, no chest pain, no palpitations, no shortness of breath, no abdominal pain.  No acute events overnight per nursing staff.  Objective: Vitals:   05/29/20 1137 05/29/20 1142 05/29/20 1330 05/29/20 1345  BP: (!) 125/49 (!) 137/46 (!) 141/64 (!) 137/57  Pulse: 88 88 76 78  Resp: 10  14 16   Temp:   97.9 F (36.6 C) 98.4 F (36.9 C)  TempSrc:      SpO2: 97% 96% 100% 96%  Weight:      Height:        Intake/Output Summary (Last 24 hours) at 05/29/2020 1359 Last data filed at 05/29/2020 1323 Gross per 24 hour  Intake 450 ml  Output 100 ml  Net 350 ml   Filed Weights   05/27/20 2215 05/29/20 1116  Weight: (!) 152 kg (!) 152 kg    Examination:  General exam: Appears calm and comfortable  Respiratory system: Clear to auscultation. Respiratory effort normal.  Oxygen well on room air Cardiovascular system: S1 & S2 heard, RRR. No JVD, murmurs, rubs, gallops or clicks. No pedal edema. Gastrointestinal system: Abdomen is nondistended, soft and nontender. No organomegaly or masses felt. Normal bowel  sounds heard. Central nervous system: Alert and oriented. No focal neurological deficits. Extremities: Left foot/ankle with splint in place, neurovascular intact Skin: No rashes, lesions or ulcers Psychiatry: Judgement and insight appear normal. Mood & affect appropriate.     Data Reviewed: I have personally reviewed following labs and imaging studies  CBC: Recent Labs  Lab 05/28/20 0957  WBC 14.2*  HGB 11.3*  HCT 38.0  MCV 80.0  PLT 364   Basic Metabolic Panel: Recent Labs  Lab 05/28/20 0957  NA 132*  K 4.0  CL 93*  CO2 27  GLUCOSE 223*  BUN 18  CREATININE 1.09*  CALCIUM 8.8*   GFR: Estimated Creatinine Clearance: 86.7 mL/min (A) (by C-G formula based on SCr of 1.09 mg/dL (H)). Liver Function Tests: No results for input(s): AST, ALT, ALKPHOS, BILITOT, PROT, ALBUMIN in the last 168 hours. No results for input(s): LIPASE, AMYLASE in the last 168 hours. No results for input(s): AMMONIA in the last 168 hours. Coagulation Profile: No results for input(s): INR, PROTIME in the last 168 hours. Cardiac Enzymes: No results for input(s): CKTOTAL, CKMB, CKMBINDEX, TROPONINI in the last 168 hours. BNP (last 3 results) No results for input(s): PROBNP in the last 8760 hours. HbA1C: Recent Labs    05/28/20 1245  HGBA1C 8.7*   CBG: Recent Labs  Lab 05/28/20 1625 05/28/20 2102 05/29/20 0632 05/29/20 1023 05/29/20 1329  GLUCAP 159* 189* 164* 156* 166*   Lipid Profile: No results for input(s): CHOL, HDL, LDLCALC, TRIG, CHOLHDL, LDLDIRECT in the last 72 hours. Thyroid Function Tests: No results for input(s): TSH, T4TOTAL, FREET4, T3FREE, THYROIDAB in the last 72 hours. Anemia Panel: No results for input(s): VITAMINB12, FOLATE, FERRITIN, TIBC, IRON, RETICCTPCT in the last 72 hours. Sepsis Labs: No results for input(s): PROCALCITON, LATICACIDVEN in the last 168 hours.  Recent Results (from the past 240 hour(s))  SARS Coronavirus 2 by RT PCR (hospital order, performed  in [facility] hospital lab) Nasopharyngeal Nasopharyngeal Swab     Status: None   Collection Time: 05/28/20  9:57 AM   Specimen: Nasopharyngeal Swab  Result Value Ref Range Status   SARS Coronavirus 2 NEGATIVE NEGATIVE Final    Comment: (NOTE) SARS-CoV-2 target nucleic acids are NOT DETECTED.  The SARS-CoV-2 RNA is generally detectable in upper and lower respiratory specimens during the acute phase of infection. The lowest concentration of SARS-CoV-2 viral copies this assay can detect is 250 copies / mL. A negative result does not preclude SARS-CoV-2 infection and should not be used as the sole basis for treatment or other patient management decisions.  A negative result may occur with improper specimen collection / handling, submission of specimen other than nasopharyngeal swab, presence of viral mutation(s) within the areas targeted by this assay, and inadequate number of viral copies (<250 copies / mL). A negative result must be combined with clinical observations, patient history, and epidemiological information.  Fact Sheet for Patients:   BoilerBrush.com.cy  Fact Sheet for Healthcare Providers: https://pope.com/  This test is not yet approved or  cleared by the [place] FDA and has been authorized for detection and/or diagnosis of SARS-CoV-2 by FDA under an Emergency Use Authorization (EUA).  This EUA will remain in effect (meaning this test can be used) for the duration of the COVID-19 declaration under Section 564(b)(1) of the Act, 21 U.S.C. section 360bbb-3(b)(1), unless the authorization is terminated or revoked sooner.  Performed at [facility], [address] [place], [place] [address]   Surgical pcr screen     Status: None   Collection Time: 05/28/20  8:38 PM   Specimen: Nasal Mucosa; Nasal Swab  Result Value Ref Range Status   MRSA, PCR NEGATIVE NEGATIVE Final   Staphylococcus aureus  NEGATIVE NEGATIVE Final    Comment: (NOTE) The Xpert SA Assay (FDA approved for NASAL specimens in patients 69 years of age and older), is one component of a comprehensive surveillance program. It is not intended to diagnose infection nor to guide or monitor treatment. Performed at [facility], [address], [place], [place] [address]          Radiology Studies: DG Chest 2 View  Result Date: 05/28/2020 CLINICAL DATA:  60 year old female status post fall last night with central chest pain and back pain. EXAM: CHEST - 2 VIEW COMPARISON:  Chest radiographs 05/23/2020 and earlier. FINDINGS: Portable AP semi upright view at 0923 hours. A right PICC line is now in place, tip at the level of the SVC just above the carina. Improved lung volumes. Allowing for portable technique the lungs are clear. Mediastinal contours are within normal limits. Visualized tracheal air column is within normal limits. No pneumothorax or pleural effusion. No acute osseous abnormality identified.  Paucity of bowel gas. IMPRESSION: No acute cardiopulmonary  abnormality or acute traumatic injury identified. Electronically Signed   By: Odessa Fleming M.D.   On: 05/28/2020 09:50   DG Thoracic Spine 2 View  Result Date: 05/28/2020 CLINICAL DATA:  59 year old female status post fall last night with central chest pain and back pain. EXAM: THORACIC SPINE 2 VIEWS COMPARISON:  Portable chest radiograph today.  CTA chest 05/16/2020. FINDINGS: Normal thoracic segmentation. Stable thoracic vertebral height and alignment from the CTA earlier this month. Chronic L1 inferior endplate deformity and sclerosis appears stable. Relatively preserved thoracic disc spaces. Multilevel mild mid and lower thoracic endplate spurring. Grossly intact visible posterior ribs. No acute osseous abnormality identified. Right PICC line redemonstrated. IMPRESSION: 1. No acute osseous abnormality identified in the thoracic spine. 2. Chronic L1 inferior  endplate deformity appears stable. Electronically Signed   By: Odessa Fleming M.D.   On: 05/28/2020 09:53   DG Lumbar Spine Complete  Result Date: 05/28/2020 CLINICAL DATA:  59 year old female status post fall last night with central chest pain and back pain. EXAM: LUMBAR SPINE - COMPLETE 4+ VIEW COMPARISON:  Thoracic spine radiographs today. Lumbar radiographs 06/28/2018. [facility] Chest CTA 05/16/2020. FINDINGS: Normal lumbar segmentation. Chronic L1 inferior endplate deformity with associated endplate sclerosis and adjacent vacuum disc appears stable from the CTA earlier this month. Stable lumbar vertebral height and alignment elsewhere. No pars fracture. No acute osseous abnormality identified. Visible sacrum and SI joints appear intact. Negative abdominal visceral contours. IMPRESSION: 1. No acute osseous abnormality identified in the lumbar spine. 2. Chronic L1 inferior endplate deformity with L1-L2 vacuum disc. Electronically Signed   By: Odessa Fleming M.D.   On: 05/28/2020 09:56   DG Ankle 2 Views Left  Result Date: 05/27/2020 CLINICAL DATA:  Left ankle deformity following fall EXAM: LEFT ANKLE - 2 VIEW COMPARISON:  None. FINDINGS: Two view radiograph left ankle demonstrates a a bimalleolar fracture dislocation of the left ankle. An oblique fracture seen of the distal left fibular metaphysis extending to the level of the tibial plafond and with 1 shaft with posterior displacement, roughly 1 cm override, and roughly 30 degrees posterior angulation of the distal fracture fragment. There is a fracture of the posterior malleolus of the distal tibia with roughly 8 mm override and 5 mm posterior displacement of the fracture fragment. There is posterior and lateral dislocation of the talar dome in relation to the in tibial plafond with resultant widening of the medial joint space in keeping with injury to the deltoid ligament. Extensive surrounding soft tissue swelling. Small  plantar calcaneal spur. Moderate midfoot degenerative arthritis noted. IMPRESSION: 1. Bimalleolar fracture dislocation of the left ankle as described above. Posterolateral tibiotalar dislocation. Electronically Signed   By: Helyn Numbers MD   On: 05/27/2020 22:34   DG Ankle Complete Left  Result Date: 05/28/2020 CLINICAL DATA:  Post reduction EXAM: LEFT ANKLE COMPLETE - 3+ VIEW COMPARISON:  05/27/2020 FINDINGS: Near complete interval reduction of a compound trimalleolar fracture dislocation of the left ankle mortise seen on prior examination. There is near anatomic apposition of the ankle mortise and fracture fragments, with mild, persistent displacement of the posterior malleolus. Midfoot arthrosis is again noted. Diffuse soft tissue edema about the foot and ankle. Cast material applied. IMPRESSION: Near complete interval reduction of a compound trimalleolar fracture dislocation of the left ankle mortise seen on prior examination. There is near anatomic apposition of the ankle mortise and fracture fragments, with mild, persistent displacement of the posterior malleolus. Electronically Signed  By: Lauralyn PrimesAlex  Bibbey M.D.   On: 05/28/2020 11:09   CT Ankle Left Wo Contrast  Result Date: 05/28/2020 CLINICAL DATA:  Evaluate left ankle fracture EXAM: CT OF THE LEFT ANKLE WITHOUT CONTRAST TECHNIQUE: Multidetector CT imaging of the left ankle was performed according to the standard protocol. Multiplanar CT image reconstructions were also generated. COMPARISON:  X-ray 05/28/2020, 12/11/2015 FINDINGS: Bones/Joint/Cartilage Acute obliquely oriented fracture of the distal left fibular metaphysis with minimal posterior displacement (series 8, image 40). Fracture line extends into the distal tibiofibular joint without definite extension into the ankle mortise. Tibiotalar joint alignment is normal without widening. There is a small vertically oriented fracture of the posterior malleolus with triangular fracture fragment  measuring 2.6 x 0.6 x 1.4 cm (series 8, image 29; series 3, image 41). There is 2 mm of proximal displacement resulting in slight articular surface depression at the posterior tibial plafond. Medial malleolus is intact without fracture. Tibiotalar joint is anatomically aligned without dislocation. Tiny 2 mm subchondral cyst at the medial talar shoulder suggesting a small osteochondral lesion (series 7, image 52). Subtalar joints are intact without widening or dislocation. Calcaneus intact. Advanced arthropathy within the midfoot with prominent subchondral cystic changes across the naviculocuneiform articulations as well as the second and third TMT joints. There are multiple osseous densities along the dorsal aspect of the midfoot, some of which appear corticated (series 8, images 19-28). The largest fragment emanating from the dorsal aspect of the medial cuneiform measures 1.6 x 0.4 cm and could represent a acute or subacute osseous avulsion (series 8, image 19). Ligaments Suboptimally assessed by CT. There is mild widening between the first and second TMT joints (series 3, image 72) raising the suspicion for an underlying age indeterminate Lisfranc ligament injury. Muscles and Tendons Extensive fatty atrophy of the lower leg and foot musculature compatible with chronic denervation changes. No acute tendinous abnormality by CT. Soft tissues Soft tissue edema without organized hematoma or fluid collection. IMPRESSION: 1. Acute mildly displaced bimalleolar left ankle fractures involving the distal left fibular metaphysis and posterior malleolus. Anatomic alignment of the ankle mortise. 2. Mild widening between the first and second TMT joints raising the suspicion for an underlying age-indeterminate Lisfranc ligament injury. 3. Advanced arthropathy within the midfoot with prominent subchondral cystic changes across the naviculocuneiform articulations as well as the second and third TMT joints. Multiple osseous  densities along the dorsal aspect of the midfoot, some of which appear corticated. The largest fragment emanating from the dorsal aspect of the medial cuneiform measures 1.6 x 0.4 cm and could represent an acute or subacute fracture versus fragmentation related to degenerative or neuropathic/Charcot joint. 4. Tiny 2 mm subchondral cyst at the medial talar shoulder suggesting a small osteochondral lesion. 5. Extensive fatty atrophy of the lower leg and foot musculature compatible with chronic denervation changes. Electronically Signed   By: Duanne GuessNicholas  Plundo D.O.   On: 05/28/2020 12:25        Scheduled Meds:  amLODipine  10 mg Oral Daily   atorvastatin  10 mg Oral q1800   bacitracin  1 application Topical BID   buPROPion  200 mg Oral BID   Chlorhexidine Gluconate Cloth  6 each Topical Daily   docusate sodium  100 mg Oral BID   DULoxetine  30 mg Oral Daily   fentaNYL       furosemide  40 mg Oral Daily   heparin  5,000 Units Subcutaneous Q8H   hydrALAZINE  25 mg Oral Q8H   insulin  aspart  0-15 Units Subcutaneous TID WC   insulin aspart  0-5 Units Subcutaneous QHS   insulin aspart  6 Units Subcutaneous TID WC   insulin glargine  50 Units Subcutaneous BID   lurasidone  20 mg Oral QHS   metoprolol tartrate  50 mg Oral BID   midazolam       mometasone-formoterol  2 puff Inhalation BID   QUEtiapine  100 mg Oral QHS   traZODone  50 mg Oral QHS   Continuous Infusions:  ceFAZolin     ceFAZolin     cefTRIAXone (ROCEPHIN)  IV 2 g (05/29/20 0858)     LOS: 1 day    Time spent: 38 minutes spent on chart review, discussion with nursing staff, consultants, updating family and interview/physical exam; more than 50% of that time was spent in counseling and/or coordination of care.    Alvira Philips [place], DO [facility] Available via Epic secure chat 7am-7pm After these hours, please refer to coverage provider listed on [email or web address] 05/29/2020, 1:59 PM

## 2020-05-29 NOTE — Op Note (Signed)
Orthopaedic Surgery Operative Note (CSN: [id number] ) Date of Surgery: 05/29/2020  Admit Date: 05/28/2020   Diagnoses: Pre-Op Diagnoses: Left bimalleolar ankle fracture dislocation Morbid obesity Type II diabetic with peripheral neuropathy   Post-Op Diagnosis: Same  Procedures: 1. CPT 27814-Open reduction internal fixation of left bimalleolar ankle fracture 2. CPT 27829-Open reduction internal fixation of left syndesmosis   Surgeons : Primary: Malea Swilling, Gillie Manners, MD  Assistant: Ulyses Southward, PA-C  Location: OR 7   Anesthesia:General with regional  Antibiotics: Ancef 3 gm preop with 1 gm vancomycin powder placed topically   Tourniquet time:None    Estimated Blood Loss:100 mL  Complications:None   Specimens:None   Implants: Implant Name Type Inv. Item Serial No. Manufacturer Lot No. LRB No. Used Action  PLATE POST FIB 7H 27 3.5 L - [id number] Plate PLATE POST FIB 7H 27 3.5 L  [employer]  Left 1 Implanted  SCREW CORT EVOS ST 3.5X12 - [id number] Screw SCREW CORT EVOS ST 3.5X12  [employer]  Left 2 Implanted  SCREW CORT 2.7X22 T8 ST EVOS - [id number] Screw SCREW CORT 2.7X22 T8 ST EVOS  [employer]  Left 1 Implanted  SCREW LOCK ST EVOS 2.7X20 - [id number] Screw SCREW LOCK ST EVOS 2.7X20  [employer]  Left 2 Implanted  SCREW CORT 2.7X16 STAR T8 EVOS - [id number] Screw SCREW CORT 2.7X16 STAR T8 EVOS  [employer]  Left 1 Implanted  SCREW CORT 3.5X13MM - [id number] Screw SCREW CORT 3.5X13MM  [employer]  Left 1 Implanted  SCREW EVOS 2.7X18 LOCK T8 - [id number] Screw SCREW EVOS 2.7X18 LOCK T8  [employer]  Left 1 Implanted  SCREW CTX 3.5X50MM EVOS - [id number] Screw SCREW CTX 3.5X50MM EVOS  [employer]  Left 2 Implanted  SCREW CTX 3.5X48MM EVOS - [id number] Screw SCREW CTX 3.5X48MM EVOS  [employer]  Left 2 Implanted     Indications for  Surgery: 59 year old female with multiple medical problems including type 2 diabetes on insulin with peripheral neuropathy, COPD with a longstanding tobacco use, history of NSTEMI with a left ankle fracture dislocation. Due to the unstable nature of her injury I recommend proceeding with surgical fixation of her ankle.    I discussed risks and benefits with the patient.  Risks include but not limited to bleeding, infection, malunion, nonunion, hardware failure, hardware irritation, nerve and blood vessel injury, posttraumatic arthritis, Charcot arthropathy, ankle stiffness, DVT, even the possibility anesthetic complications.  She agreed to proceed with surgery and consent was obtained.  Operative Findings: 1.  Open reduction internal fixation of left bimalleolar ankle fracture using [employer] EVOS posterior lateral distal fibular locking plate 2.  Open reduction internal fixation of left ankle syndesmosis using 4 quadracortical 3.5 millimeter screws through the fibular plate  Procedure: The patient was identified in the preoperative holding area. Consent was confirmed with the patient and their family and all questions were answered. The operative extremity was marked after confirmation with the patient. she was then brought back to the operating room by our anesthesia colleagues.  She was placed under general anesthetic and carefully transferred over to a radiolucent flat top table.  A bump was placed under her operative hip.  The left lower extremity was prepped and draped in usual sterile fashion.  A timeout was performed to verify the patient, the procedure, and the extremity.  Preoperative antibiotics were dosed.  Fluoroscopic imaging was  obtained to show the unstable nature of her injury.  A direct lateral approach was carried down through skin and subcutaneous tissue to the fibula.  Subperiosteal dissection was performed to visualize the fracture and exposed the distal fibula.  Care was taken  to prevent damage to the superficial peroneal nerve branches.  The oblique Weber B distal fibula fracture was cleaned of hematoma.  A reduction tenaculum was used to anatomically reduce the fracture and fluoroscopic imaging confirmed reduction.  It was held provisionally with 1.6 mm K wires.  I then positioned a 3.5 mm posterior lateral EVOS [employer] distal fibular locking plate.  I held it provisionally with a K wire.  I placed a nonlocking screw into the fibular shaft and a nonlocking 2.7 millimeter screw into the distal fibula.  I confirmed positioning with fluoroscopy.  I then placed another nonlocking screw into the fibular shaft and I placed locking screws into the distal 2.7 mm segment.  Due to her peripheral neuropathy in her high risk of postoperative complications I felt that syndesmotic fixation was appropriate.  An external rotation stress view was obtained which showed significant medial clear space widening.  I had the ankle dorsiflexed by my assistant provided a medial pressure to the fibula and I proceeded to place for 3.5 millimeter screws through the plate across the fibula and tibia gaining 4 cortices of fixation for each screw.  After the fixation I then performed an external rotation stress view which showed no medial clear space widening and no significant instability.  Final fluoroscopic imaging was obtained.  The incision was copiously irrigated.  A gram of vancomycin powder was placed into the incision.  Layered closure of 0 Vicryl, 2-0 Vicryl and 3-0 nylon was used.  The patient was placed in a well-padded short leg splint.  She was awoken from anesthesia and taken to the PACU in stable condition.  Post Op Plan/Instructions: Patient will be nonweightbearing to the left lower extremity.  She will receive postoperative Ancef.  She was placed on Lovenox for DVT prophylaxis while she is in the and I would recommend discharge on 325 twice daily.  We will have her mobilize with  physical and Occupational Therapy.  I was present and performed the entire surgery.  Ulyses Southward, PA-C did assist me throughout the case. An assistant was necessary given the difficulty in approach, maintenance of reduction and ability to instrument the fracture.   Truitt Merle, MD [facility]

## 2020-05-29 NOTE — Plan of Care (Addendum)
CRITICAL VALUE ALERT  Critical Value:  CBG 460  Date & Time Notied:  05/29/2020 @2020   Provider Notified: M.  Orders Received/Actions taken: 10 units   CRITICAL VALUE ALERT  Critical Value:  CBG 448  Date & Time Notied:  05/29/20 @2324   Provider Notified: M. 05/31/20  Orders Received/Actions taken: 10 units    Problem: Education: Goal: Knowledge of General Education information will improve Description: Including pain rating scale, medication(s)/side effects and non-pharmacologic comfort measures Outcome: Progressing   Problem: Activity: Goal: Risk for activity intolerance will decrease Outcome: Progressing   Problem: Pain Managment: Goal: General experience of comfort will improve Outcome: Progressing   Problem: Safety: Goal: Ability to remain free from injury will improve Outcome: Progressing   Problem: Skin Integrity: Goal: Risk for impaired skin integrity will decrease Outcome: Progressing

## 2020-05-29 NOTE — Anesthesia Preprocedure Evaluation (Addendum)
Anesthesia Evaluation  Patient identified by MRN, date of birth, ID band Patient awake    Reviewed: Allergy & Precautions, NPO status , Patient's Chart, lab work & pertinent test results  Airway Mallampati: III  TM Distance: >3 FB Neck ROM: Full    Dental  (+) Teeth Intact, Dental Advisory Given   Pulmonary sleep apnea , COPD,  COPD inhaler, Current Smoker and Patient abstained from smoking.,  25 pack year history, still smoking    Pulmonary exam normal breath sounds clear to auscultation       Cardiovascular hypertension, Pt. on medications Normal cardiovascular exam Rhythm:Regular Rate:Normal     Neuro/Psych  Headaches, PSYCHIATRIC DISORDERS Anxiety Depression    GI/Hepatic Neg liver ROS, PUD, GERD  Controlled,  Endo/Other  diabetes, Poorly Controlled, Type 2, Insulin DependentMorbid obesityBMI 54 Last a1c 8.7  Renal/GU negative Renal ROS  negative genitourinary   Musculoskeletal  (+) Arthritis , Osteoarthritis,  L ankle fx   Abdominal (+) + obese,   Peds negative pediatric ROS (+)  Hematology  (+) Blood dyscrasia, anemia , H/H 11.3/38, plt 364   Anesthesia Other Findings Chronic pain lower back and knees- takes percocet around the clock at home for pain   Reproductive/Obstetrics negative OB ROS                            Anesthesia Physical Anesthesia Plan  ASA: III  Anesthesia Plan: General and Regional   Post-op Pain Management: GA combined w/ Regional for post-op pain   Induction: Intravenous  PONV Risk Score and Plan: 2 and Ondansetron, Dexamethasone, Midazolam and Treatment may vary due to age or medical condition  Airway Management Planned: LMA  Additional Equipment: None  Intra-op Plan:   Post-operative Plan: Extubation in OR  Informed Consent: I have reviewed the patients History and Physical, chart, labs and discussed the procedure including the risks, benefits and  alternatives for the proposed anesthesia with the patient or authorized representative who has indicated his/her understanding and acceptance.     Dental advisory given  Plan Discussed with: CRNA  Anesthesia Plan Comments:         Anesthesia Quick Evaluation

## 2020-05-30 ENCOUNTER — Encounter (HOSPITAL_COMMUNITY): Payer: Self-pay | Admitting: Student

## 2020-05-30 LAB — BASIC METABOLIC PANEL
Anion gap: 12 (ref 5–15)
BUN: 16 mg/dL (ref 6–20)
CO2: 27 mmol/L (ref 22–32)
Calcium: 8.7 mg/dL — ABNORMAL LOW (ref 8.9–10.3)
Chloride: 96 mmol/L — ABNORMAL LOW (ref 98–111)
Creatinine, Ser: 1.15 mg/dL — ABNORMAL HIGH (ref 0.44–1.00)
GFR calc Af Amer: >60 mL/min (ref >60)
GFR calc non Af Amer: 53 mL/min — ABNORMAL LOW (ref >60)
Glucose, Bld: 394 mg/dL — ABNORMAL HIGH (ref 70–99)
Potassium: 4.1 mmol/L (ref 3.5–5.1)
Sodium: 135 mmol/L (ref 135–145)

## 2020-05-30 LAB — GLUCOSE, CAPILLARY
Glucose-Capillary: 448 mg/dL — ABNORMAL HIGH (ref 70–99)
Glucose-Capillary: 467 mg/dL — ABNORMAL HIGH (ref 70–99)
Glucose-Capillary: 392 mg/dL — ABNORMAL HIGH (ref 70–99)
Glucose-Capillary: 221 mg/dL — ABNORMAL HIGH (ref 70–99)
Glucose-Capillary: 205 mg/dL — ABNORMAL HIGH (ref 70–99)
Glucose-Capillary: 212 mg/dL — ABNORMAL HIGH (ref 70–99)
Glucose-Capillary: 168 mg/dL — ABNORMAL HIGH (ref 70–99)
Glucose-Capillary: 199 mg/dL — ABNORMAL HIGH (ref 70–99)

## 2020-05-30 LAB — CBC
HCT: 32.5 % — ABNORMAL LOW (ref 36.0–46.0)
Hemoglobin: 9.8 g/dL — ABNORMAL LOW (ref 12.0–15.0)
MCH: 24.1 pg — ABNORMAL LOW (ref 26.0–34.0)
MCHC: 30.2 g/dL (ref 30.0–36.0)
MCV: 80 fL (ref 80.0–100.0)
Platelets: 336 10*3/uL (ref 150–400)
RBC: 4.06 MIL/uL (ref 3.87–5.11)
RDW: 20.4 % — ABNORMAL HIGH (ref 11.5–15.5)
WBC: 9.2 10*3/uL (ref 4.0–10.5)
nRBC: 0 % (ref 0.0–0.2)

## 2020-05-30 LAB — VITAMIN D 25 HYDROXY (VIT D DEFICIENCY, FRACTURES): Vit D, 25-Hydroxy: 38.4 ng/mL (ref 30–100)

## 2020-05-30 MED ORDER — ENOXAPARIN SODIUM 80 MG/0.8ML ~~LOC~~ SOLN
0.5000 mg/kg | SUBCUTANEOUS | Status: DC
  Administered 2020-05-31 – 2020-06-04 (×5): 75 mg via SUBCUTANEOUS
  Filled 2020-05-30 (×6): qty 0.75

## 2020-05-30 MED ORDER — CEFTRIAXONE SODIUM 2 G IV SOLR: 2.0000 g | INTRAVENOUS | Status: DC

## 2020-05-30 MED ORDER — INSULIN ASPART 100 UNIT/ML ~~LOC~~ SOLN
12.0000 [IU] | Freq: Three times a day (TID) | SUBCUTANEOUS | Status: DC
  Administered 2020-05-30 – 2020-05-31 (×5): 12 [IU] via SUBCUTANEOUS
  Administered 2020-06-01: 5 [IU] via SUBCUTANEOUS
  Administered 2020-06-03: 12 [IU] via SUBCUTANEOUS

## 2020-05-30 MED ORDER — SODIUM CHLORIDE 0.9 % IV SOLN
2.0000 g | INTRAVENOUS | Status: DC
  Administered 2020-05-30 – 2020-06-04 (×6): 2 g via INTRAVENOUS
  Filled 2020-05-30 (×6): qty 20

## 2020-05-30 MED ORDER — INSULIN ASPART 100 UNIT/ML ~~LOC~~ SOLN
10.0000 [IU] | Freq: Once | SUBCUTANEOUS | Status: AC
  Administered 2020-05-30: 10 [IU] via SUBCUTANEOUS

## 2020-05-30 MED ORDER — DULOXETINE HCL 60 MG PO CPEP
90.0000 mg | ORAL_CAPSULE | Freq: Every day | ORAL | Status: DC
  Administered 2020-05-30 – 2020-06-04 (×6): 90 mg via ORAL
  Filled 2020-05-30 (×6): qty 1

## 2020-05-30 MED ORDER — INSULIN GLARGINE 100 UNIT/ML ~~LOC~~ SOLN
70.0000 [IU] | Freq: Two times a day (BID) | SUBCUTANEOUS | Status: DC
  Administered 2020-05-30 – 2020-06-03 (×10): 70 [IU] via SUBCUTANEOUS
  Filled 2020-05-30 (×12): qty 0.7

## 2020-05-30 NOTE — Evaluation (Signed)
Occupational Therapy Evaluation Patient Details Name: [name] MRN: [id number] DOB: March 31, 1961 Today's Date: 05/30/2020    History of Present Illness Pt is a 59 year old woman admitted after fall resulting in L ankle fx. Underwent ORIF. 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 PMH: HTN, COPD, DM, peripheral neuropathy, CAD, STEMI, anxiety, depression, morbid obesity.   Clinical Impression   Pt was functioning modified independently prior to admission. Presents with L LE throbbing and poor standing balance with inability to maintain NWB on R LE during transfers. Pt requires set up to total assist for ADL and +2 mod to max assist for OOB. She will need further rehab in SNF prior to return home. Will follow acutely.    Follow Up Recommendations  SNF;Supervision/Assistance - 24 hour    Equipment Recommendations  Other (comment) (defer to SNF)    Recommendations for Other Services       Precautions / Restrictions Precautions Precautions: Fall Required Braces or Orthoses: Other Brace Other Brace: RIGHT post op shoe Restrictions Weight Bearing Restrictions: Yes LLE Weight Bearing: Non weight bearing      Mobility Bed Mobility Overal bed mobility: Modified Independent                Transfers Overall transfer level: Needs assistance Equipment used: Rolling walker (2 wheeled);2 person hand held assist Transfers: Sit to/from [employer] Sit to Stand: +2 physical assistance;Mod assist   Squat pivot transfers: +2 physical assistance;Max assist     General transfer comment: cues for technique and hand placement, assist to rise and steady as she moved her hands from sitting surface to RW, pt unable to maintain NWB on L LE with squat pivot, but could in static standing with RW while sitting surfaces were changed behind her    Balance Overall balance assessment: Needs assistance   Sitting balance-Leahy  Scale: Good       Standing balance-Leahy Scale: Poor                             ADL either performed or assessed with clinical judgement   ADL Overall ADL's : Needs assistance/impaired Eating/Feeding: Independent   Grooming: Wash/dry hands;Set up;Sitting   Upper Body Bathing: Set up;Sitting   Lower Body Bathing: Sit to/from stand;Total assistance   Upper Body Dressing : Minimal assistance;Sitting   Lower Body Dressing: Total assistance;Sit to/from stand   Toilet Transfer: +2 for physical assistance;Maximal assistance;BSC;Squat-pivot   Toileting- Clothing Manipulation and Hygiene: Set up;Sitting/lateral lean               Vision Patient Visual Report: No change from baseline       Perception     Praxis      Pertinent Vitals/Pain Pain Assessment: Faces Faces Pain Scale: Hurts even more Pain Location: L LE Pain Descriptors / Indicators: Throbbing Pain Intervention(s): Limited activity within patient's tolerance;Monitored during session;Premedicated before session     Hand Dominance Right   Extremity/Trunk Assessment Upper Extremity Assessment Upper Extremity Assessment: Overall WFL for tasks assessed   Lower Extremity Assessment Lower Extremity Assessment: RLE deficits/detail;LLE deficits/detail RLE Deficits / Details: 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 Hip/knee WFL, pt able to wiggle toes   Cervical / Trunk Assessment Cervical / Trunk Assessment: Other exceptions Cervical / Trunk Exceptions: increased body habitus   Communication Communication Communication: No difficulties  Cognition Arousal/Alertness: Awake/alert Behavior During Therapy: Anxious Overall Cognitive Status: Within Functional Limits for tasks assessed                                 General Comments: pt fearful she will fall   General Comments       Exercises Exercises: General Lower Extremity General Exercises  - Lower Extremity Long Arc Quad: Both;10 reps;Seated Hip Flexion/Marching: Both;Seated;5 reps   Shoulder Instructions      Home Living Family/patient expects to be discharged to:: Private residence Living Arrangements: Alone Available Help at Discharge: Family;Available PRN/intermittently (daughters) Type of Home: Apartment Home Access: Level entry     Home Layout: One level     Bathroom Shower/Tub: Chief Strategy Officer: Handicapped height Bathroom Accessibility: Yes   Home Equipment: Walker - 2 wheels;Grab bars - tub/shower;Shower seat;Grab bars - toilet          Prior Functioning/Environment Level of Independence: Independent with assistive device(s)        Comments: Using walker, independent ADL's/IADL's        OT Problem List: Decreased strength;Impaired balance (sitting and/or standing);Decreased knowledge of use of DME or AE;Decreased knowledge of precautions;Obesity;Pain      OT Treatment/Interventions: Self-care/ADL training;DME and/or AE instruction;Patient/family education;Balance training;Therapeutic activities    OT Goals(Current goals can be found in the care plan section) Acute Rehab OT Goals Patient Stated Goal: to avoid another fall OT Goal Formulation: With patient Time For Goal Achievement: 06/13/20 Potential to Achieve Goals: Good ADL Goals Pt Will Perform Lower Body Bathing: with min assist;with adaptive equipment;sitting/lateral leans Pt Will Perform Lower Body Dressing: with min assist;with adaptive equipment;sitting/lateral leans Pt Will Transfer to Toilet: with mod assist;stand pivot transfer;bedside commode Pt Will Perform Toileting - Clothing Manipulation and hygiene: sitting/lateral leans  OT Frequency: Min 2X/week   Barriers to D/C: Decreased caregiver support          Co-evaluation PT/OT/SLP Co-Evaluation/Treatment: Yes Reason for Co-Treatment: For patient/therapist safety;To address functional/ADL transfers PT  goals addressed during session: Mobility/safety with mobility OT goals addressed during session: ADL's and self-care;Proper use of Adaptive equipment and DME      AM-PAC OT "6 Clicks" Daily Activity     Outcome Measure Help from another person eating meals?: None Help from another person taking care of personal grooming?: A Little Help from another person toileting, which includes using toliet, bedpan, or urinal?: A Lot Help from another person bathing (including washing, rinsing, drying)?: A Lot Help from another person to put on and taking off regular upper body clothing?: A Little Help from another person to put on and taking off regular lower body clothing?: Total 6 Click Score: 15   End of Session Equipment Utilized During Treatment: Gait belt;Rolling walker;Other (comment) (post op shoe) Nurse Communication: Mobility status;Need for lift equipment  Activity Tolerance: Patient tolerated treatment well Patient left: in chair;with call bell/phone within reach;with chair alarm set  OT Visit Diagnosis: Unsteadiness on feet (R26.81);Other abnormalities of gait and mobility (R26.89);Pain;Muscle weakness (generalized) (M62.81);History of falling (Z91.81)                Time: 3716-9678 OT Time Calculation (min): 38 min Charges:  OT General Charges $OT Visit: 1 Visit OT Evaluation $OT Eval Moderate Complexity: 1 Mod  Martie Round, OTR/L Acute Rehabilitation Services Pager: [phone] Office: [phone]  Evern Bio 05/30/2020, 2:40 PM

## 2020-05-30 NOTE — Progress Notes (Signed)
Orthopedic Tech Progress Note Patient Details:  [name] 19-Mar-1961 [id number] Left shoe on counter for when PT comes and work with her. Ortho Devices Type of Ortho Device: Postop shoe/boot Ortho Device/Splint Location: RLE Ortho Device/Splint Interventions: Ordered   Post Interventions Patient Tolerated: Other (comment) Instructions Provided: Care of device, Adjustment of device, Poper ambulation with device   [name] 05/30/2020, 10:08 AM

## 2020-05-30 NOTE — Progress Notes (Signed)
PROGRESS NOTE    [name]  HBZ:[id number] DOB: 10-Apr-1961 DOA: 05/28/2020 PCP: Wilmer Floor., MD    Brief Narrative:  [name] is a 59 y.o. female with history of mild nonobstructive CAD, COPD not on oxygen, diastolic CHF, IDDM-2 with neuropathy, morbid obesity, depression, tobacco use and recent hospitalization [facility] from 8/12-8/22 for right great toe osteomyelitis, non-STEMI and pulmonary edema presenting to ED due to left ankle pain after fall at home.  Patient was found to have bimalleolar fracture with dislocation of her left ankle.  Orthopedic surgery was consulted who recommended admission to [facility] for surgical intervention.  [facility] was consulted for admission.   Assessment & Plan:   Active Problems:   Insulin-requiring or dependent type II diabetes mellitus (HCC)   Morbid (severe) obesity due to excess calories (HCC)   TOBACCO USE   Depression with anxiety   Essential hypertension   Closed left ankle fracture   Left bimalleolar ankle fracture with dislocation, closed Patient presenting following mechanical fall at home with left ankle pain.  X-ray/CT left ankle notable for bimalleolar fracture with dislocation.  Patient underwent ORIF by orthopedics, Dr. Jena Gauss on 05/29/2020. --Nonweightbearing left lower extremity --DVT prophylaxis with Lovenox while inpatient with recommendation of aspirin 325 mg twice daily on discharge --Pain control with oxycodone 5-10 mg every 4 hours as needed --Dilaudid 1 mg IV every 3 hours as needed for severe breakthrough pain --Robaxin 500 mg every 6 hours as needed muscle spasms --PT/OT consultation pending  Right great toe osteomyelitis s/p amputation at [facility] on 05/21/2020.   Blood culture and surgical tissue cultures reportedly negative.  Wound culture with beta-hemolytic group B streptococcus.  Discharged on 05/26/2020 on IV ceftriaxone via PICC line for 4 weeks. --Continue IV ceftriaxone --Wound care  consulted  History of nonobstructive CAD/recent non-STEM LHC at [facility] on 05/17/2020 reported as normal coronaries and mild nonobstructive CAD.  No anginal symptoms.  High-sensitivity troponin negative.  --Continue metoprolol tartrate 50 mg p.o. twice daily  Chronic diastolic CHF:  Echo at [facility] reported as EF of 60 to 65% and no regional wall motion abnormalities.  On p.o. Lasix.  No cardiopulmonary symptoms.  Appears euvolemica but difficult exam due to body habitus. --Furosemide 40 mg p.o. daily --Strict I's and O's Daily weights  Chronic COPD?:  No formal PFT on file. Smoked cigarettes since age of 58. --Nebs as needed --Continue home ICS/LABA.  Uncontrolled IDDM-2 with hyperglycemia and neuropathy:  Recent A1c 8.5%.  Reports using Lantus 80 units twice daily with sliding scale insulin. --Blood sugars elevated up to 400s overnight, received Decadron 14mg  during surgery and reportedly ate cake last night. --Increase Lantus to 70u BID --NovoLog 12 units 3 times daily AC --SSI-moderate dose for further coverage --Continue home statin --Continue home Lyrica --CBGs before every meal/at bedtime  Essential hypertension:  BP 131/58 this morning. --Amlodipine 10 mg p.o. daily --Hydralazine 25 mg every 8 hours --Furosemide 40 mg p.o. daily --Metoprolol tartrate 50 mg p.o. twice daily --Continue to closely monitor blood pressure  Normocytic anemia:  Hgb 11 (baseline 9-10).  --Follow CBC daily  Anxiety/depression:  --Duloxetine 90 mg p.o. daily --Latuda 20 mg p.o. daily --Trazodone 50 mg nightly --Seroquel 200 mg p.o. nightly  HLD: Continue atorvastatin 10 mg p.o. daily  Morbid obesity Body mass index is 54.07 kg/m.  Encouraged lifestyle changes/weight loss as this complicates all facets of care.  Could benefit from bariatric surgical evaluation in the future.  DVT prophylaxis:  Lovenox Code Status: Full code Family Communication: No family present at bedside this  morning  Disposition Plan:  Status is: Inpatient  Remains inpatient appropriate because:Ongoing active pain requiring inpatient pain management, Ongoing diagnostic testing needed not appropriate for outpatient work up, Unsafe d/c plan and Inpatient level of care appropriate due to severity of illness   Dispo: The patient is from: Home              Anticipated d/c is to: SNF              Anticipated d/c date is: 2 days              Patient currently is not medically stable to d/c.  Needs SNF placement    Consultants:   Orthopedics, Dr. Jena Gauss  Cardiology  Procedures:   ORIF left ankle fracture/dislocation 05/29/2020, Dr. Jena Gauss  Antimicrobials:   Perioperative vancomycin  Ceftriaxone   Subjective: Patient seen and examined bedside, resting comfortably.  Pain improved following surgical intervention.  Blood sugars extremely elevated overnight after receiving high-dose Decadron during surgical invention yesterday as well as dietary indiscretions overnight.  No other complaints or concerns at this time. Denies headache, no fever/chills/night sweats, no nausea/vomiting/diarrhea, no chest pain, no palpitations, no shortness of breath, no abdominal pain.  No acute events overnight per nursing staff.  Objective: Vitals:   05/29/20 2131 05/30/20 0007 05/30/20 0457 05/30/20 0723  BP: (!) 129/100 (!) 137/55 (!) 131/58 (!) 174/62  Pulse: 99 96 88 86  Resp:  18 17   Temp:  98.2 F (36.8 C) 98 F (36.7 C) 98.3 F (36.8 C)  TempSrc:  Oral Oral Oral  SpO2:  93% 95% 96%  Weight:      Height:        Intake/Output Summary (Last 24 hours) at 05/30/2020 1336 Last data filed at 05/30/2020 0834 Gross per 24 hour  Intake 716.7 ml  Output --  Net 716.7 ml   Filed Weights   05/27/20 2215 05/29/20 1116  Weight: (!) 152 kg (!) 152 kg    Examination:  General exam: Appears calm and comfortable  Respiratory system: Clear to auscultation. Respiratory effort normal.  Oxygen well on  room air Cardiovascular system: S1 & S2 heard, RRR. No JVD, murmurs, rubs, gallops or clicks. No pedal edema. Gastrointestinal system: Abdomen is nondistended, soft and nontender. No organomegaly or masses felt. Normal bowel sounds heard. Central nervous system: Alert and oriented. No focal neurological deficits. Extremities: Left foot/ankle with dressing in place, neurovascular intact Skin: No rashes, lesions or ulcers Psychiatry: Judgement and insight appear normal. Mood & affect appropriate.     Data Reviewed: I have personally reviewed following labs and imaging studies  CBC: Recent Labs  Lab 05/28/20 0957 05/30/20 0147  WBC 14.2* 9.2  HGB 11.3* 9.8*  HCT 38.0 32.5*  MCV 80.0 80.0  PLT 364 336   Basic Metabolic Panel: Recent Labs  Lab 05/28/20 0957 05/30/20 0147  NA 132* 135  K 4.0 4.1  CL 93* 96*  CO2 27 27  GLUCOSE 223* 394*  BUN 18 16  CREATININE 1.09* 1.15*  CALCIUM 8.8* 8.7*   GFR: Estimated Creatinine Clearance: 82.1 mL/min (A) (by C-G formula based on SCr of 1.15 mg/dL (H)). Liver Function Tests: No results for input(s): AST, ALT, ALKPHOS, BILITOT, PROT, ALBUMIN in the last 168 hours. No results for input(s): LIPASE, AMYLASE in the last 168 hours. No results for input(s): AMMONIA in the last 168 hours. Coagulation Profile: No  results for input(s): INR, PROTIME in the last 168 hours. Cardiac Enzymes: No results for input(s): CKTOTAL, CKMB, CKMBINDEX, TROPONINI in the last 168 hours. BNP (last 3 results) No results for input(s): PROBNP in the last 8760 hours. HbA1C: Recent Labs    05/28/20 1245  HGBA1C 8.7*   CBG: Recent Labs  Lab 05/29/20 2324 05/30/20 0123 05/30/20 0457 05/30/20 0643 05/30/20 1141  GLUCAP 448* 392* 221* 205* 212*   Lipid Profile: No results for input(s): CHOL, HDL, LDLCALC, TRIG, CHOLHDL, LDLDIRECT in the last 72 hours. Thyroid Function Tests: No results for input(s): TSH, T4TOTAL, FREET4, T3FREE, THYROIDAB in the last 72  hours. Anemia Panel: No results for input(s): VITAMINB12, FOLATE, FERRITIN, TIBC, IRON, RETICCTPCT in the last 72 hours. Sepsis Labs: No results for input(s): PROCALCITON, LATICACIDVEN in the last 168 hours.  Recent Results (from the past 240 hour(s))  SARS Coronavirus 2 by RT PCR (hospital order, performed in [facility] hospital lab) Nasopharyngeal Nasopharyngeal Swab     Status: None   Collection Time: 05/28/20  9:57 AM   Specimen: Nasopharyngeal Swab  Result Value Ref Range Status   SARS Coronavirus 2 NEGATIVE NEGATIVE Final    Comment: (NOTE) SARS-CoV-2 target nucleic acids are NOT DETECTED.  The SARS-CoV-2 RNA is generally detectable in upper and lower respiratory specimens during the acute phase of infection. The lowest concentration of SARS-CoV-2 viral copies this assay can detect is 250 copies / mL. A negative result does not preclude SARS-CoV-2 infection and should not be used as the sole basis for treatment or other patient management decisions.  A negative result may occur with improper specimen collection / handling, submission of specimen other than nasopharyngeal swab, presence of viral mutation(s) within the areas targeted by this assay, and inadequate number of viral copies (<250 copies / mL). A negative result must be combined with clinical observations, patient history, and epidemiological information.  Fact Sheet for Patients:   BoilerBrush.com.cy  Fact Sheet for Healthcare Providers: https://pope.com/  This test is not yet approved or  cleared by the [place] FDA and has been authorized for detection and/or diagnosis of SARS-CoV-2 by FDA under an Emergency Use Authorization (EUA).  This EUA will remain in effect (meaning this test can be used) for the duration of the COVID-19 declaration under Section 564(b)(1) of the Act, 21 U.S.C. section 360bbb-3(b)(1), unless the authorization is terminated or revoked  sooner.  Performed at [facility], [address], [place], [place] [address]   Surgical pcr screen     Status: None   Collection Time: 05/28/20  8:38 PM   Specimen: Nasal Mucosa; Nasal Swab  Result Value Ref Range Status   MRSA, PCR NEGATIVE NEGATIVE Final   Staphylococcus aureus NEGATIVE NEGATIVE Final    Comment: (NOTE) The Xpert SA Assay (FDA approved for NASAL specimens in patients 57 years of age and older), is one component of a comprehensive surveillance program. It is not intended to diagnose infection nor to guide or monitor treatment. Performed at [facility], [address] [place], [place] [address]          Radiology Studies: DG Ankle Complete Left  Result Date: 05/29/2020 CLINICAL DATA:  Post ORIF of the LEFT ankle. EXAM: LEFT ANKLE COMPLETE - 3+ VIEW COMPARISON:  Preoperative radiographs from August twenty-fourth 2020 FINDINGS: Casting material overlying the ankle limits bony detail. Lateral cortical plate and screw fixation of the lateral malleolus is noted with syndesmotic screws passing through the cortical plating  into the tibia. No new abnormality.  Improved anatomic alignment Signs of midfoot degenerative changes and prior injury outlined in prior CT report. IMPRESSION: Status post ORIF of a left ankle fracture without immediate complication. Radiated graph limited by overlying casting material. Electronically Signed   By: Donzetta KohutGeoffrey  Wile M.D.   On: 05/29/2020 15:29   DG Ankle Complete Left  Result Date: 05/29/2020 CLINICAL DATA:  Open reduction and internal fixation for fracture-dislocation EXAM: DG C-ARM 1-60 MIN; LEFT ANKLE COMPLETE - 3+ VIEW FLUOROSCOPY TIME:  Fluoroscopy Time:  0 minutes 43 seconds Radiation Exposure Index (if provided by the fluoroscopic device): 1.7 mGy Number of Acquired Spot Images: 4 acquired images COMPARISON:  Left ankle radiographs and left ankle CT May 28, 2020 FINDINGS: Initial frontal and lateral  views demonstrate gross ankle mortise disruption with the ankle mortise lateral and posterior to the tibial plafond. There is a spiral fracture of the distal fibular diaphysis. Fracture along the posterior distal tibia is also present with mild displacement of fracture fragments. Subsequent images in frontal and lateral projection show screw and plate fixation through the fracture of the distal fibula with screws transfixing the distal tibiofibular syndesmosis. There is reduction of ankle mortise disruption. Note fracture of the posterior aspect of the distal tibia. There is an inferior calcaneal spur. IMPRESSION: Postoperative fixation with screws and plate transfixing the distal fibular fracture. Other screws traverse the distal tibio- fibular syndesmosis. There is no longer ankle mortise disruption after fixation. Fracture of the distal posterior tibia again noted. There is an inferior calcaneal spur. Electronically Signed   By: Bretta BangWilliam  Woodruff III M.D.   On: 05/29/2020 14:31   DG C-Arm 1-60 Min  Result Date: 05/29/2020 CLINICAL DATA:  Open reduction and internal fixation for fracture-dislocation EXAM: DG C-ARM 1-60 MIN; LEFT ANKLE COMPLETE - 3+ VIEW FLUOROSCOPY TIME:  Fluoroscopy Time:  0 minutes 43 seconds Radiation Exposure Index (if provided by the fluoroscopic device): 1.7 mGy Number of Acquired Spot Images: 4 acquired images COMPARISON:  Left ankle radiographs and left ankle CT May 28, 2020 FINDINGS: Initial frontal and lateral views demonstrate gross ankle mortise disruption with the ankle mortise lateral and posterior to the tibial plafond. There is a spiral fracture of the distal fibular diaphysis. Fracture along the posterior distal tibia is also present with mild displacement of fracture fragments. Subsequent images in frontal and lateral projection show screw and plate fixation through the fracture of the distal fibula with screws transfixing the distal tibiofibular syndesmosis. There is  reduction of ankle mortise disruption. Note fracture of the posterior aspect of the distal tibia. There is an inferior calcaneal spur. IMPRESSION: Postoperative fixation with screws and plate transfixing the distal fibular fracture. Other screws traverse the distal tibio- fibular syndesmosis. There is no longer ankle mortise disruption after fixation. Fracture of the distal posterior tibia again noted. There is an inferior calcaneal spur. Electronically Signed   By: Bretta BangWilliam  Woodruff III M.D.   On: 05/29/2020 14:31        Scheduled Meds: . amLODipine  10 mg Oral Daily  . atorvastatin  10 mg Oral q1800  . bacitracin  1 application Topical BID  . buPROPion  200 mg Oral BID  . Chlorhexidine Gluconate Cloth  6 each Topical Daily  . docusate sodium  100 mg Oral BID  . DULoxetine  30 mg Oral Daily  . enoxaparin (LOVENOX) injection  40 mg Subcutaneous Q24H  . furosemide  40 mg Oral Daily  . hydrALAZINE  25  mg Oral Q8H  . insulin aspart  0-15 Units Subcutaneous TID WC  . insulin aspart  0-5 Units Subcutaneous QHS  . insulin aspart  12 Units Subcutaneous TID WC  . insulin glargine  70 Units Subcutaneous BID  . lurasidone  20 mg Oral QHS  . metoprolol tartrate  50 mg Oral BID  . mometasone-formoterol  2 puff Inhalation BID  . QUEtiapine  100 mg Oral QHS  . traZODone  50 mg Oral QHS   Continuous Infusions: . 0.9 % NaCl with KCl 20 mEq / L 50 mL/hr at 05/29/20 1652  . methocarbamol (ROBAXIN) IV       LOS: 2 days    Time spent: 36 minutes spent on chart review, discussion with nursing staff, consultants, updating family and interview/physical exam; more than 50% of that time was spent in counseling and/or coordination of care.    Alvira Philips [place], DO [facility] Available via Epic secure chat 7am-7pm After these hours, please refer to coverage provider listed on [email or web address] 05/30/2020, 1:36 PM

## 2020-05-30 NOTE — TOC CAGE-AID Note (Signed)
Transition of Care [facility]) - CAGE-AID Screening   Patient Details  Name: [name] MRN: [id number] Date of Birth: 1960-12-25  Transition of Care [facility]) CM/SW Contact:    Emeterio Reeve, [place] Phone Number: 05/30/2020, 1:09 PM   Clinical Narrative:  CSW met with pt at bedside. CSW introduced self and explained her role at the hospital.  PT denies alcohol use and substance use. Pt did not need any resources at this time.    CAGE-AID Screening:    Have You Ever Felt You Ought to Cut Down on Your Drinking or Drug Use?: No Have People Annoyed You By Critizing Your Drinking Or Drug Use?: No Have You Felt Bad Or Guilty About Your Drinking Or Drug Use?: No Have You Ever Had a Drink or Used Drugs First Thing In The Morning to Steady Your Nerves or to Get Rid of a Hangover?: No CAGE-AID Score: 0  Substance Abuse Education Offered: Yes    Blima Ledger, [place] Social Worker [phone]

## 2020-05-30 NOTE — Progress Notes (Signed)
Orthopaedic Trauma Progress Note  S: Doing okay this morning.  Pain in left ankle, rates it 7/10 currently.  Just received pain medication.  Patient was scheduled for first postoperative follow-up with Dr. Hal Neer today after amputation last week.  Patient concerned about how she will mobilize now without being able to put weight through her left lower extremity.  Patient lives at home alone.  We did briefly discussed possibility of a SNF the patient is concerned about the financial aspect of this.  She is requesting to speak with the case manager/social worker today.  I will make sure this is arranged.  Patient with no other questions or concerns currently  O:  Vitals:   05/30/20 0457 05/30/20 0723  BP: (!) 131/58 (!) 174/62  Pulse: 88 86  Resp: 17   Temp: 98 F (36.7 C) 98.3 F (36.8 C)  SpO2: 95% 96%    General: Sitting up in bed, no acute distress Respiratory:  No increased work of breathing.  Left Lower Extremity: Short leg splint in place, is clean, dry, intact.  Nontender above her splint.  Able to wiggle her toes some.  Diminished sensation with light touch of the dorsal and plantar aspect of her toes, has neuropathy at baseline.  Extremity warm.  Brisk cap refill  Imaging: Stable post op imaging.   Labs:  Results for orders placed or performed during the hospital encounter of 05/28/20 (from the past 24 hour(s))  Glucose, capillary     Status: Abnormal   Collection Time: 05/29/20 10:23 AM  Result Value Ref Range   Glucose-Capillary 156 (H) 70 - 99 mg/dL  Glucose, capillary     Status: Abnormal   Collection Time: 05/29/20  1:29 PM  Result Value Ref Range   Glucose-Capillary 166 (H) 70 - 99 mg/dL  Glucose, capillary     Status: Abnormal   Collection Time: 05/29/20  4:11 PM  Result Value Ref Range   Glucose-Capillary 210 (H) 70 - 99 mg/dL  Glucose, capillary     Status: Abnormal   Collection Time: 05/29/20  8:16 PM  Result Value Ref Range   Glucose-Capillary 460 (H) 70 - 99  mg/dL  Glucose, capillary     Status: Abnormal   Collection Time: 05/29/20 11:22 PM  Result Value Ref Range   Glucose-Capillary 467 (H) 70 - 99 mg/dL  Glucose, capillary     Status: Abnormal   Collection Time: 05/29/20 11:24 PM  Result Value Ref Range   Glucose-Capillary 448 (H) 70 - 99 mg/dL  Glucose, capillary     Status: Abnormal   Collection Time: 05/30/20  1:23 AM  Result Value Ref Range   Glucose-Capillary 392 (H) 70 - 99 mg/dL  Basic metabolic panel     Status: Abnormal   Collection Time: 05/30/20  1:47 AM  Result Value Ref Range   Sodium 135 135 - 145 mmol/L   Potassium 4.1 3.5 - 5.1 mmol/L   Chloride 96 (L) 98 - 111 mmol/L   CO2 27 22 - 32 mmol/L   Glucose, Bld 394 (H) 70 - 99 mg/dL   BUN 16 6 - 20 mg/dL   Creatinine, Ser 4.09 (H) 0.44 - 1.00 mg/dL   Calcium 8.7 (L) 8.9 - 10.3 mg/dL   GFR calc non Af Amer 53 (L) >60 mL/min   GFR calc Af Amer >60 >60 mL/min   Anion gap 12 5 - 15  CBC     Status: Abnormal   Collection Time: 05/30/20  1:47 AM  Result Value Ref Range   WBC 9.2 4.0 - 10.5 K/uL   RBC 4.06 3.87 - 5.11 MIL/uL   Hemoglobin 9.8 (L) 12.0 - 15.0 g/dL   HCT 29.9 (L) 36 - 46 %   MCV 80.0 80.0 - 100.0 fL   MCH 24.1 (L) 26.0 - 34.0 pg   MCHC 30.2 30.0 - 36.0 g/dL   RDW 24.2 (H) 68.3 - 41.9 %   Platelets 336 150 - 400 K/uL   nRBC 0.0 0.0 - 0.2 %  Glucose, capillary     Status: Abnormal   Collection Time: 05/30/20  4:57 AM  Result Value Ref Range   Glucose-Capillary 221 (H) 70 - 99 mg/dL  Glucose, capillary     Status: Abnormal   Collection Time: 05/30/20  6:43 AM  Result Value Ref Range   Glucose-Capillary 205 (H) 70 - 99 mg/dL    Assessment: 59 year old female status post twisting injury with fall, 1 Day Post-Op   Injuries: Left bimalleolar ankle fracture dislocation s/p ORIF ankle with fixation of syndesmosis  Weightbearing: NWB LLE  Insicional and dressing care: Dressings left intact until follow-up   Showering: Okay to shower with assistance, must  keep splint covered and dry when doing so  Orthopedic device(s): Splint LLE  CV/Blood loss: Acute blood loss anemia, Hgb 9.8 this morning.   Pain management:  1. Tylenol 650 mg q 6 hours PRN 2. Robaxin 500 mg q 6 hours PRN 3. Oxycodone 5-10 mg q 4 hours PRN 4. Dilaudid 1 mg q 3 hours PRN  VTE prophylaxis: Lovenox starting today  SCDs: In place RLE  ID: Ancef 2gm post op completed  Foley/Lines:  No foley, KVO IVFs  Medical co-morbidities:  COPD, diabetes on insulin, peripheral neuropathy, history of NSTEMI   Impediments to Fracture Healing: Diabetes.  Vitamin D level pending, will start supplementation as indicated  Dispo: PT/OT evaluation today, dispo pending.  Will likely need SNF.  Will order postop shoe for RLE today.    Follow - up plan: 2 weeks  Contact information:  Truitt Merle MD, Ulyses Southward PA-C   Angie Hogg A. [facility] [phone] (office) [email or web address]

## 2020-05-30 NOTE — Evaluation (Signed)
Physical Therapy Evaluation Patient Details Name: [name] MRN: [id number] DOB: May 05, 1961 Today's Date: 05/30/2020   History of Present Illness  Pt is a 59 year old woman admitted after fall resulting in L ankle fx. Underwent ORIF. Pt with recent admission to [facility] for R great toe amputation (WBAT with RW). PMH: HTN, COPD, DM, peripheral neuropathy, CAD, STEMI, anxiety, depression, morbid obesity.  Clinical Impression  Prior to admission, pt lives alone, is independent with ADL's/IADL's and has been using a walker in setting of recent right 1st toe amputation (pt reports WBAT). Pt evaluated s/p procedure listed above. Presents with decreased functional mobility secondary to left ankle pain, weakness, fear of falling and difficulty maintaining weightbearing precautions. Pt performed low pivot transfer towards right onto bedside commode with two person maximal assist. Performed stand from [employer] with two person moderate assist and walker and chair was brought up behind her. Will continue to practice transfer training using RW. Recommend SNF to maximize functional mobility in light of deficits listed above and decreased caregiver support.     Follow Up Recommendations SNF    Equipment Recommendations  Wheelchair cushion (measurements PT);Wheelchair (measurements PT);3in1 (PT)    Recommendations for Other Services       Precautions / Restrictions Precautions Precautions: Fall Required Braces or Orthoses: Other Brace Other Brace: RIGHT post op shoe Restrictions Weight Bearing Restrictions: Yes LLE Weight Bearing: Non weight bearing      Mobility  Bed Mobility Overal bed mobility: Modified Independent                Transfers Overall transfer level: Needs assistance Equipment used: Rolling walker (2 wheeled);2 person hand held assist Transfers: Sit to/from [employer] Sit to Stand: +2 physical assistance;Mod assist   Squat pivot transfers: +2  physical assistance;Max assist     General transfer comment: cues for technique and hand placement, assist to rise and steady as she moved her hands from sitting surface to RW, pt unable to maintain NWB on L LE with squat pivot, but could in static standing with RW while sitting surfaces were changed behind her  Ambulation/Gait                Stairs            Wheelchair Mobility    Modified Rankin (Stroke Patients Only)       Balance Overall balance assessment: Needs assistance   Sitting balance-Leahy Scale: Good       Standing balance-Leahy Scale: Poor                               Pertinent Vitals/Pain Pain Assessment: Faces Faces Pain Scale: Hurts even more Pain Location: L LE Pain Descriptors / Indicators: Throbbing Pain Intervention(s): Limited activity within patient's tolerance;Monitored during session;Premedicated before session    Home Living Family/patient expects to be discharged to:: Private residence Living Arrangements: Alone Available Help at Discharge: Family;Available PRN/intermittently (daughters) Type of Home: Apartment Home Access: Level entry     Home Layout: One level Home Equipment: Walker - 2 wheels;Grab bars - tub/shower;Shower seat;Grab bars - toilet      Prior Function Level of Independence: Independent with assistive device(s)         Comments: Using walker, independent ADL's/IADL's     Hand Dominance   Dominant Hand: Right    Extremity/Trunk Assessment   Upper Extremity Assessment Upper Extremity Assessment: Overall WFL for tasks  assessed    Lower Extremity Assessment Lower Extremity Assessment: RLE deficits/detail;LLE deficits/detail RLE Deficits / Details: 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 Hip/knee WFL, pt able to wiggle toes    Cervical / Trunk Assessment Cervical / Trunk Assessment: Other exceptions Cervical / Trunk Exceptions: increased body  habitus  Communication   Communication: No difficulties  Cognition Arousal/Alertness: Awake/alert Behavior During Therapy: Anxious Overall Cognitive Status: Within Functional Limits for tasks assessed                                 General Comments: pt fearful she will fall      General Comments      Exercises General Exercises - Lower Extremity Long Arc Quad: Both;10 reps;Seated Hip Flexion/Marching: Both;Seated;5 reps   Assessment/Plan    PT Assessment Patient needs continued PT services  PT Problem List Decreased strength;Decreased balance;Decreased mobility;Pain       PT Treatment Interventions Functional mobility training;Therapeutic activities;Therapeutic exercise;Balance training;DME instruction;Patient/family education;Wheelchair mobility training    PT Goals (Current goals can be found in the Care Plan section)  Acute Rehab PT Goals Patient Stated Goal: to avoid another fall PT Goal Formulation: With patient Time For Goal Achievement: 06/13/20 Potential to Achieve Goals: Good    Frequency Min 3X/week   Barriers to discharge Decreased caregiver support      Co-evaluation PT/OT/SLP Co-Evaluation/Treatment: Yes Reason for Co-Treatment: For patient/therapist safety;To address functional/ADL transfers PT goals addressed during session: Mobility/safety with mobility OT goals addressed during session: ADL's and self-care;Proper use of Adaptive equipment and DME       AM-PAC PT "6 Clicks" Mobility  Outcome Measure Help needed turning from your back to your side while in a flat bed without using bedrails?: None Help needed moving from lying on your back to sitting on the side of a flat bed without using bedrails?: None Help needed moving to and from a bed to a chair (including a wheelchair)?: Total Help needed standing up from a chair using your arms (e.g., wheelchair or bedside chair)?: A Lot Help needed to walk in hospital room?: Total Help  needed climbing 3-5 steps with a railing? : Total 6 Click Score: 13    End of Session Equipment Utilized During Treatment: Gait belt;Other (comment) (post op shoe) Activity Tolerance: Patient tolerated treatment well Patient left: in chair;with call bell/phone within reach;with chair alarm set Nurse Communication: Mobility status;Need for lift equipment PT Visit Diagnosis: Pain;Other abnormalities of gait and mobility (R26.89);History of falling (Z91.81) Pain - Right/Left: Left Pain - part of body: Ankle and joints of foot    Time: 9924-2683 PT Time Calculation (min) (ACUTE ONLY): 45 min   Charges:   PT Evaluation $PT Eval Moderate Complexity: 1 Mod PT Treatments $Therapeutic Activity: 8-22 mins          Lillia Pauls, PT, DPT Acute Rehabilitation Services Pager [phone] Office [phone]   Norval Morton 05/30/2020, 1:35 PM

## 2020-05-30 NOTE — Plan of Care (Signed)
  Problem: Elimination: Goal: Will not experience complications related to urinary retention Outcome: Completed/Met   Problem: Pain Managment: Goal: General experience of comfort will improve Outcome: Progressing   Problem: Skin Integrity: Goal: Risk for impaired skin integrity will decrease Outcome: Progressing

## 2020-05-30 NOTE — Progress Notes (Signed)
Placed patient on CPAP for the night via auto-mode.  

## 2020-05-30 NOTE — Plan of Care (Signed)

## 2020-05-31 LAB — GLUCOSE, CAPILLARY
Glucose-Capillary: 128 mg/dL — ABNORMAL HIGH (ref 70–99)
Glucose-Capillary: 80 mg/dL (ref 70–99)
Glucose-Capillary: 190 mg/dL — ABNORMAL HIGH (ref 70–99)

## 2020-05-31 LAB — CBC
HCT: 31.8 % — ABNORMAL LOW (ref 36.0–46.0)
Hemoglobin: 9.5 g/dL — ABNORMAL LOW (ref 12.0–15.0)
MCH: 24.4 pg — ABNORMAL LOW (ref 26.0–34.0)
MCHC: 29.9 g/dL — ABNORMAL LOW (ref 30.0–36.0)
MCV: 81.5 fL (ref 80.0–100.0)
Platelets: 377 10*3/uL (ref 150–400)
RBC: 3.9 MIL/uL (ref 3.87–5.11)
RDW: 20.9 % — ABNORMAL HIGH (ref 11.5–15.5)
WBC: 10.3 10*3/uL (ref 4.0–10.5)
nRBC: 0 % (ref 0.0–0.2)

## 2020-05-31 LAB — BASIC METABOLIC PANEL
Anion gap: 9 (ref 5–15)
BUN: 11 mg/dL (ref 6–20)
CO2: 29 mmol/L (ref 22–32)
Calcium: 8.9 mg/dL (ref 8.9–10.3)
Chloride: 102 mmol/L (ref 98–111)
Creatinine, Ser: 0.92 mg/dL (ref 0.44–1.00)
GFR calc Af Amer: >60 mL/min (ref >60)
GFR calc non Af Amer: >60 mL/min (ref >60)
Glucose, Bld: 131 mg/dL — ABNORMAL HIGH (ref 70–99)
Potassium: 3.7 mmol/L (ref 3.5–5.1)
Sodium: 140 mmol/L (ref 135–145)

## 2020-05-31 MED ORDER — OXYCODONE HCL 5 MG PO TABS
10.0000 mg | ORAL_TABLET | ORAL | Status: DC | PRN
  Administered 2020-05-31 – 2020-06-04 (×15): 15 mg via ORAL
  Filled 2020-05-31 (×15): qty 3

## 2020-05-31 MED ORDER — OXYCODONE-ACETAMINOPHEN 7.5-325 MG PO TABS
1.0000 | ORAL_TABLET | Freq: Four times a day (QID) | ORAL | 0 refills | Status: DC | PRN
Start: 2020-05-31 — End: 2021-06-25

## 2020-05-31 MED ORDER — ASPIRIN EC 325 MG PO TBEC: 325.0000 mg | DELAYED_RELEASE_TABLET | Freq: Two times a day (BID) | ORAL | 0 refills | Status: AC

## 2020-05-31 MED ORDER — METHOCARBAMOL 500 MG PO TABS: 500.0000 mg | ORAL_TABLET | Freq: Four times a day (QID) | ORAL | 0 refills | Status: DC | PRN

## 2020-05-31 NOTE — Progress Notes (Signed)
PROGRESS NOTE    BREIANA [name]  ZWC:[id number] DOB: [birth year] DOA: 05/28/2020 PCP: Wilmer Floor., MD    Brief Narrative:  [name] is a 59 y.o. female with history of mild nonobstructive CAD, COPD not on oxygen, diastolic CHF, IDDM-2 with neuropathy, morbid obesity, depression, tobacco use and recent hospitalization [facility] from 8/12-8/22 for right great toe osteomyelitis, non-STEMI and pulmonary edema presenting to ED due to left ankle pain after fall at home.  Patient was found to have bimalleolar fracture with dislocation of her left ankle.  Orthopedic surgery was consulted who recommended admission to [facility] for surgical intervention.  [facility] was consulted for admission.   Assessment & Plan:   Principal Problem:   Closed left ankle fracture Active Problems:   Insulin-requiring or dependent type II diabetes mellitus (HCC)   Morbid (severe) obesity due to excess calories (HCC)   TOBACCO USE   Depression with anxiety   Essential hypertension   Left bimalleolar ankle fracture with dislocation, closed traumatic. Patient presenting following mechanical fall at home with left ankle pain.  X-ray/CT left ankle notable for bimalleolar fracture with dislocation.  Patient underwent ORIF by orthopedics, Dr. Jena Gauss on 05/29/2020. --Nonweightbearing left lower extremity --DVT prophylaxis with Lovenox while inpatient with recommendation of aspirin 325 mg twice daily on discharge --Pain control with oxycodone 5-10 mg every 4 hours as needed --Dilaudid 1 mg IV every 3 hours as needed for severe breakthrough pain --Robaxin 500 mg every 6 hours as needed muscle spasms --PT/OT continues to work with patient.  She will benefit with inpatient therapies at the skilled nursing facility before going home and living independently.  Right great toe osteomyelitis s/p amputation at [facility] on 05/21/2020.   Blood culture and surgical tissue cultures reportedly  negative.  Wound culture with beta-hemolytic group B streptococcus.  Discharged on 05/26/2020 on IV ceftriaxone via PICC line for 4 weeks. --Continue IV ceftriaxone --Wound looks clean and healing.  History of nonobstructive CAD/recent non-STEMI LHC at [facility] on 05/17/2020 reported as normal coronaries and mild nonobstructive CAD.  No anginal symptoms.  High-sensitivity troponin negative.  --Continue metoprolol tartrate 50 mg p.o. twice daily  Chronic diastolic CHF:  Echo at [facility] reported as EF of 60 to 65% and no regional wall motion abnormalities.  On p.o. Lasix.  --Furosemide 40 mg p.o. daily --Strict I's and O's Daily weights  Chronic COPD: No formal PFT on file. Smoked cigarettes since age of 58. --Nebs as needed --Continue home ICS/LABA.  Uncontrolled IDDM-2 with hyperglycemia and neuropathy:  Recent A1c 8.5%.  Reports using Lantus 80 units twice daily with sliding scale insulin. --Blood sugars better today with increase Lantus to 70u BID --NovoLog 12 units 3 times daily AC --SSI-moderate dose for further coverage --Continue home statin --Continue home Lyrica --CBGs before every meal/at bedtime  Essential hypertension:  BP 131/58 this morning. --Amlodipine 10 mg p.o. daily --Hydralazine 25 mg every 8 hours --Furosemide 40 mg p.o. daily --Metoprolol tartrate 50 mg p.o. twice daily --Continue to closely monitor blood pressure  Normocytic anemia:  Hgb 11 (baseline 9-10).  --Follow CBC daily  Anxiety/depression:  --Duloxetine 90 mg p.o. daily --Latuda 20 mg p.o. daily --Trazodone 50 mg nightly --Seroquel 200 mg p.o. nightly  HLD: Continue atorvastatin 10 mg p.o. daily  Morbid obesity Body mass index is 54.07 kg/m.  Encouraged lifestyle changes/weight loss as this complicates all facets of care.  Could benefit from bariatric surgical evaluation in the future.  DVT  prophylaxis: Lovenox Code Status: Full code Family Communication: No family present at bedside this  morning  Disposition Plan:  Status is: Inpatient  Remains inpatient appropriate because:Ongoing active pain requiring inpatient pain management, Ongoing diagnostic testing needed not appropriate for outpatient work up, Unsafe d/c plan and Inpatient level of care appropriate due to severity of illness   Dispo: The patient is from: Home              Anticipated d/c is to: SNF              Anticipated d/c date is: When bed available.              Patient currently is medically stable to transfer to skilled level of care when bed available.    Consultants:   Orthopedics, Dr. Jena Gauss  Cardiology  Procedures:   ORIF left ankle fracture/dislocation 05/29/2020, Dr. Jena Gauss  Antimicrobials:   Perioperative vancomycin  Ceftriaxone   Subjective: Patient seen and examined.  No overnight events.  He still has considerable pain on left ankle. Patient wanted me to remove her right foot dressing.  Right great toe proximal clean and dry with intact sutures.  Objective: Vitals:   05/31/20 0844 05/31/20 1026 05/31/20 1319 05/31/20 1405  BP:  (!) 159/90 (!) 159/90 (!) 143/58  Pulse:  74  70  Resp:    17  Temp:    98.2 F (36.8 C)  TempSrc:    Oral  SpO2: 97%   96%  Weight:      Height:        Intake/Output Summary (Last 24 hours) at 05/31/2020 1503 Last data filed at 05/30/2020 2100 Gross per 24 hour  Intake 1534.37 ml  Output --  Net 1534.37 ml   Filed Weights   05/27/20 2215 05/29/20 1116  Weight: (!) 152 kg (!) 152 kg    Examination:  Physical Exam Constitutional:      Comments: Patient looks comfortable on room air.  Not in any distress.  HENT:     Head: Normocephalic.     Mouth/Throat:     Mouth: Mucous membranes are moist.  Cardiovascular:     Rate and Rhythm: Normal rate and regular rhythm.  Pulmonary:     Breath sounds: Normal breath sounds.  Musculoskeletal:     Comments: Left foot on dressing, not removed by me. Right foot, surgical dressing removed,  patient has clean great toe amputation stump with intact sutures.  Has some redness and minimal induration surrounding the great toe and tip of the second toe.       Data Reviewed: I have personally reviewed following labs and imaging studies  CBC: Recent Labs  Lab 05/28/20 0957 05/30/20 0147 05/31/20 0305  WBC 14.2* 9.2 10.3  HGB 11.3* 9.8* 9.5*  HCT 38.0 32.5* 31.8*  MCV 80.0 80.0 81.5  PLT 364 336 377   Basic Metabolic Panel: Recent Labs  Lab 05/28/20 0957 05/30/20 0147 05/31/20 0305  NA 132* 135 140  K 4.0 4.1 3.7  CL 93* 96* 102  CO2 27 27 29   GLUCOSE 223* 394* 131*  BUN 18 16 11   CREATININE 1.09* 1.15* 0.92  CALCIUM 8.8* 8.7* 8.9   GFR: Estimated Creatinine Clearance: 102.7 mL/min (by C-G formula based on SCr of 0.92 mg/dL). Liver Function Tests: No results for input(s): AST, ALT, ALKPHOS, BILITOT, PROT, ALBUMIN in the last 168 hours. No results for input(s): LIPASE, AMYLASE in the last 168 hours. No results for input(s): AMMONIA in  the last 168 hours. Coagulation Profile: No results for input(s): INR, PROTIME in the last 168 hours. Cardiac Enzymes: No results for input(s): CKTOTAL, CKMB, CKMBINDEX, TROPONINI in the last 168 hours. BNP (last 3 results) No results for input(s): PROBNP in the last 8760 hours. HbA1C: No results for input(s): HGBA1C in the last 72 hours. CBG: Recent Labs  Lab 05/30/20 0643 05/30/20 1141 05/30/20 1634 05/30/20 1949 05/31/20 0657  GLUCAP 205* 212* 168* 199* 128*   Lipid Profile: No results for input(s): CHOL, HDL, LDLCALC, TRIG, CHOLHDL, LDLDIRECT in the last 72 hours. Thyroid Function Tests: No results for input(s): TSH, T4TOTAL, FREET4, T3FREE, THYROIDAB in the last 72 hours. Anemia Panel: No results for input(s): VITAMINB12, FOLATE, FERRITIN, TIBC, IRON, RETICCTPCT in the last 72 hours. Sepsis Labs: No results for input(s): PROCALCITON, LATICACIDVEN in the last 168 hours.  Recent Results (from the past 240  hour(s))  SARS Coronavirus 2 by RT PCR (hospital order, performed [facility]) Nasopharyngeal Nasopharyngeal Swab     Status: None   Collection Time: 05/28/20  9:57 AM   Specimen: Nasopharyngeal Swab  Result Value Ref Range Status   SARS Coronavirus 2 NEGATIVE NEGATIVE Final    Comment: (NOTE) SARS-CoV-2 target nucleic acids are NOT DETECTED.  The SARS-CoV-2 RNA is generally detectable in upper and lower respiratory specimens during the acute phase of infection. The lowest concentration of SARS-CoV-2 viral copies this assay can detect is 250 copies / mL. A negative result does not preclude SARS-CoV-2 infection and should not be used as the sole basis for treatment or other patient management decisions.  A negative result may occur with improper specimen collection / handling, submission of specimen other than nasopharyngeal swab, presence of viral mutation(s) within the areas targeted by this assay, and inadequate number of viral copies (<250 copies / mL). A negative result must be combined with clinical observations, patient history, and epidemiological information.  Fact Sheet for Patients:   BoilerBrush.com.cyhttps://www.fda.gov/media/136312/download  Fact Sheet for Healthcare Providers: https://pope.com/https://www.fda.gov/media/136313/download  This test is not yet approved or  cleared by the [place] FDA and has been authorized for detection and/or diagnosis of SARS-CoV-2 by FDA under an Emergency Use Authorization (EUA).  This EUA will remain in effect (meaning this test can be used) for the duration of the COVID-19 declaration under Section 564(b)(1) of the Act, 21 U.S.C. section 360bbb-3(b)(1), unless the authorization is terminated or revoked sooner.  Performed at [facility], [address] [place], [place] [address]   Surgical pcr screen     Status: None   Collection Time: 05/28/20  8:38 PM   Specimen: Nasal Mucosa; Nasal Swab  Result Value Ref Range Status    MRSA, PCR NEGATIVE NEGATIVE Final   Staphylococcus aureus NEGATIVE NEGATIVE Final    Comment: (NOTE) The Xpert SA Assay (FDA approved for NASAL specimens in patients 58 years of age and older), is one component of a comprehensive surveillance program. It is not intended to diagnose infection nor to guide or monitor treatment. Performed at [facility], [address] [place], [place] [address]          Radiology Studies: No results found.      Scheduled Meds: . amLODipine  10 mg Oral Daily  . atorvastatin  10 mg Oral q1800  . bacitracin  1 application Topical BID  . buPROPion  200 mg Oral BID  . Chlorhexidine Gluconate Cloth  6 each Topical Daily  . docusate sodium  100 mg Oral BID  . DULoxetine  90 mg Oral Daily  . enoxaparin (LOVENOX) injection  0.5 mg/kg Subcutaneous Q24H  . furosemide  40 mg Oral Daily  . hydrALAZINE  25 mg Oral Q8H  . insulin aspart  0-15 Units Subcutaneous TID WC  . insulin aspart  0-5 Units Subcutaneous QHS  . insulin aspart  12 Units Subcutaneous TID WC  . insulin glargine  70 Units Subcutaneous BID  . lurasidone  20 mg Oral QHS  . metoprolol tartrate  50 mg Oral BID  . mometasone-formoterol  2 puff Inhalation BID  . QUEtiapine  100 mg Oral QHS  . traZODone  50 mg Oral QHS   Continuous Infusions: . cefTRIAXone (ROCEPHIN)  IV 2 g (05/31/20 1325)  . methocarbamol (ROBAXIN) IV       LOS: 3 days    Time spent: 30 minutes

## 2020-05-31 NOTE — Discharge Instructions (Signed)
Truitt Merle, MD [facility] [place], PA-C [facility] [address] [phone])   [phone](fax)   POST-OPERATIVE INSTRUCTIONS - LOWER EXTREMITY   WOUND CARE ? Please keep splint clean dry and intact until followup.  ? You may shower on Post-Op Day #2.  ? You must keep splint dry during this process and may find that a plastic bag taped around the leg or alternatively a towel based bath may be a better option.   ? If you get your splint wet or if it is damaged please contact our clinic.  EXERCISES ? Due to your splint being in place you will not be able to bear weight through your extremity.   ? DO NOT PUT ANY WEIGHT ON YOUR OPERATIVE LEG ? Please use crutches or a walker to avoid weight bearing.    POST-OP MEDICATIONS- Multimodal approach to pain control . In general your pain will be controlled with a combination of substances.  Prescriptions unless otherwise discussed are electronically sent to your pharmacy.  This is a carefully made plan we use to minimize narcotic use.     - Meloxicam OR Celebrex - Anti-inflammatory medication taken on a scheduled basis  - Acetaminophen - Non-narcotic pain medicine taken on a scheduled basis   - Oxycodone - This is a strong narcotic, to be used only on an "as needed" basis for pain.  -  Aspirin 81mg  - This medicine is used to minimize the risk of blood clots after surgery.             -          Zofran - take as needed for nausea   FOLLOW-UP ? If you develop a Fever (>101.5), Redness or Drainage from the surgical incision site, please call our office to arrange for an evaluation. ? Please call the office to schedule a follow-up appointment for your incision check if you do not already have one, 7-10 days post-operatively.  IF YOU HAVE ANY QUESTIONS, PLEASE FEEL FREE TO CALL OUR OFFICE.  HELPFUL INFORMATION  ? If you had a block, it will wear off between 8-24 hrs postop typically.  This is period when your pain  may go from nearly zero to the pain you would have had postop without the block.  This is an abrupt transition but nothing dangerous is happening.  You may take an extra dose of narcotic when this happens.  ? You should wean off your narcotic medicines as soon as you are able.  Most patients will be off or using minimal narcotics before their first postop appointment.   ? We suggest you use the pain medication the first night prior to going to bed, in order to ease any pain when the anesthesia wears off. You should avoid taking pain medications on an empty stomach as it will make you nauseous.  ? Do not drink alcoholic beverages or take illicit drugs when taking pain medications.  ? In most states it is against the law to drive while you are in a splint or sling.  And certainly against the law to drive while taking narcotics.  ? You may return to work/school in the next couple of days when you feel up to it.   ? Pain medication may make you constipated.  Below are a few solutions to try in this order: - Decrease the amount of pain medication if you aren't having pain. - Drink lots of decaffeinated fluids. - Drink prune juice and/or each dried  prunes  o If the first 3 don't work start with additional solutions - Take Colace - an over-the-counter stool softener - Take Senokot - an over-the-counter laxative - Take Miralax - a stronger over-the-counter laxative

## 2020-05-31 NOTE — Plan of Care (Signed)

## 2020-05-31 NOTE — Progress Notes (Addendum)
Orthopaedic Trauma Progress Note  S: Doing okay this morning. Having pain in left ankle. States she takes chronic pain medications at home, so current regimen isn't helping her pain a ton. Will re-assess regimen and adjust as appropriate.  Was able to stand and pivot to bedside chair yesterday with therapy. Put a small amount of wait on left leg with transfer so notes some soreness in the area. Otherwise, no questions or concerns currently.  O:  Vitals:   05/30/20 2254 05/31/20 0443  BP:  (!) 131/54  Pulse: 81 77  Resp: 18 18  Temp:  98.6 F (37 C)  SpO2: 95% 93%    General: Sitting up in bed, no acute distress Respiratory:  No increased work of breathing.  Left Lower Extremity: Short leg splint in place, is clean, dry, intact.  Nontender above her splint.  Able to wiggle her toes some.  Diminished sensation with light touch of the dorsal and plantar aspect of her toes, has neuropathy at baseline.  Extremity warm.  Brisk cap refill  Imaging: Stable post op imaging.   Labs:  Results for orders placed or performed during the hospital encounter of 05/28/20 (from the past 24 hour(s))  Glucose, capillary     Status: Abnormal   Collection Time: 05/30/20 11:41 AM  Result Value Ref Range   Glucose-Capillary 212 (H) 70 - 99 mg/dL  Glucose, capillary     Status: Abnormal   Collection Time: 05/30/20  4:34 PM  Result Value Ref Range   Glucose-Capillary 168 (H) 70 - 99 mg/dL  Glucose, capillary     Status: Abnormal   Collection Time: 05/30/20  7:49 PM  Result Value Ref Range   Glucose-Capillary 199 (H) 70 - 99 mg/dL  CBC     Status: Abnormal   Collection Time: 05/31/20  3:05 AM  Result Value Ref Range   WBC 10.3 4.0 - 10.5 K/uL   RBC 3.90 3.87 - 5.11 MIL/uL   Hemoglobin 9.5 (L) 12.0 - 15.0 g/dL   HCT 99.3 (L) 36 - 46 %   MCV 81.5 80.0 - 100.0 fL   MCH 24.4 (L) 26.0 - 34.0 pg   MCHC 29.9 (L) 30.0 - 36.0 g/dL   RDW 71.6 (H) 96.7 - 89.3 %   Platelets 377 150 - 400 K/uL   nRBC 0.0 0.0 -  0.2 %  Basic metabolic panel     Status: Abnormal   Collection Time: 05/31/20  3:05 AM  Result Value Ref Range   Sodium 140 135 - 145 mmol/L   Potassium 3.7 3.5 - 5.1 mmol/L   Chloride 102 98 - 111 mmol/L   CO2 29 22 - 32 mmol/L   Glucose, Bld 131 (H) 70 - 99 mg/dL   BUN 11 6 - 20 mg/dL   Creatinine, Ser 8.10 0.44 - 1.00 mg/dL   Calcium 8.9 8.9 - 17.5 mg/dL   GFR calc non Af Amer >60 >60 mL/min   GFR calc Af Amer >60 >60 mL/min   Anion gap 9 5 - 15  Glucose, capillary     Status: Abnormal   Collection Time: 05/31/20  6:57 AM  Result Value Ref Range   Glucose-Capillary 128 (H) 70 - 99 mg/dL    Assessment: 59 year old female status post twisting injury with fall, 2 Days Post-Op   Injuries: Left bimalleolar ankle fracture dislocation s/p ORIF ankle with fixation of syndesmosis  Weightbearing: NWB LLE  Insicional and dressing care: Dressings left intact until follow-up   Showering:  Okay to shower with assistance, must keep splint covered and dry when doing so  Orthopedic device(s): Splint LLE  CV/Blood loss: Acute blood loss anemia, Hgb 9.5 this morning.   Pain management:  1. Tylenol 650 mg q 6 hours PRN 2. Robaxin 500 mg q 6 hours PRN 3. Oxycodone 10-15 mg q 4 hours PRN 4. Dilaudid 1 mg q 3 hours PRN  VTE prophylaxis: Lovenox   SCDs: In place RLE  ID: Ancef 2gm post op completed  Foley/Lines:  No foley, KVO IVFs  Medical co-morbidities:  COPD, diabetes on insulin, peripheral neuropathy, history of NSTEMI   Impediments to Fracture Healing: Diabetes.  Vitamin D level looks good at 38. No need for supplementation  Dispo: therapies as tolerated, PT/OT recommending SNF.  Patient ok for d/c from ortho standpoint once cleared by medicine  Follow - up plan: 2 weeks  Contact information:  Truitt Merle MD, Ulyses Southward PA-C   Tiasia Weberg A. [facility] [phone] (office) [email or web address]

## 2020-05-31 NOTE — Progress Notes (Signed)
MD removed the dressing to the right foot- dressing reapplied.  Sutures noted . No drainage noted.  Slight redness noted and right 2nd toe with redness.

## 2020-05-31 NOTE — Progress Notes (Signed)
Placed patient on CPAP for the night via auto-mode.  

## 2020-06-01 LAB — GLUCOSE, CAPILLARY
Glucose-Capillary: 120 mg/dL — ABNORMAL HIGH (ref 70–99)
Glucose-Capillary: 192 mg/dL — ABNORMAL HIGH (ref 70–99)
Glucose-Capillary: 169 mg/dL — ABNORMAL HIGH (ref 70–99)
Glucose-Capillary: 159 mg/dL — ABNORMAL HIGH (ref 70–99)
Glucose-Capillary: 197 mg/dL — ABNORMAL HIGH (ref 70–99)

## 2020-06-01 NOTE — Plan of Care (Signed)
  Problem: Education: Goal: Knowledge of General Education information will improve Description: Including pain rating scale, medication(s)/side effects and non-pharmacologic comfort measures Outcome: Progressing   Problem: Pain Managment: Goal: General experience of comfort will improve Outcome: Progressing   Problem: Safety: Goal: Ability to remain free from injury will improve Outcome: Progressing   

## 2020-06-01 NOTE — Plan of Care (Signed)

## 2020-06-01 NOTE — NC FL2 (Signed)
[place] MEDICAID FL2 LEVEL OF CARE SCREENING TOOL     IDENTIFICATION  Patient Name: [name] Birthdate: [birth year] Sex: female Admission Date (Current Location): 05/28/2020  [facility] and [place] Number:  [employer] and Address:  [facility] [place], [address] [place], [place] [address]      Provider Number: [id number]  Attending Physician Name and Address:  Dorcas Carrow, MD  Relative Name and Phone Number:  Dahlia Client [phone]    Current Level of Care: Hospital Recommended Level of Care: Skilled Nursing Facility Prior Approval Number:    Date Approved/Denied:   PASRR Number: pending  Discharge Plan: SNF    Current Diagnoses: Patient Active Problem List   Diagnosis Date Noted  . Closed left ankle fracture 05/28/2020  . Moderate protein malnutrition (HCC)   . Adult BMI 50.0-59.9 kg/sq m (HCC)   . Normocytic anemia 04/21/2018  . Diabetic foot infection (HCC) 04/21/2018  . Mild renal insufficiency 04/21/2018  . Diabetic infection of right foot (HCC)   . Preventative health care 04/14/2011  . DYSMETABOLIC SYNDROME 12/08/2010  . ULNAR NEUROPATHY, BILATERAL 10/01/2010  . Insulin-requiring or dependent type II diabetes mellitus (HCC) 09/11/2010  . HYPERLIPIDEMIA 09/11/2010  . Morbid (severe) obesity due to excess calories (HCC) 09/11/2010  . TOBACCO USE 09/11/2010  . Depression with anxiety 09/11/2010  . Essential hypertension 09/11/2010  . GERD 09/11/2010  . PEPTIC ULCER DISEASE 09/11/2010  . COLONIC POLYPS, HX OF 09/11/2010  . Lumbosacral spondylosis 02/27/2010    Orientation RESPIRATION BLADDER Height & Weight     Time, Self, Situation, Place  Normal Continent Weight: (!) 335 lb (152 kg) Height:  5\' 6"  (167.6 cm)  BEHAVIORAL SYMPTOMS/MOOD NEUROLOGICAL BOWEL NUTRITION STATUS  Dangerous to self, others or property   Continent Diet (See dc summary)  AMBULATORY STATUS COMMUNICATION OF NEEDS Skin   Extensive Assist Verbally  Surgical wounds (Incision closed right toe 05/29/20, incision closed left ankle 05/29/20)                       Personal Care Assistance Level of Assistance  Bathing, Feeding, Dressing Bathing Assistance: Limited assistance Feeding assistance: Independent Dressing Assistance: Limited assistance     Functional Limitations Info  Sight, Hearing, Speech Sight Info: Adequate Hearing Info: Adequate Speech Info: Adequate    SPECIAL CARE FACTORS FREQUENCY                       Contractures Contractures Info: Not present    Additional Factors Info  Code Status, Allergies, Psychotropic, Insulin Sliding Scale Code Status Info: Full Allergies Info: Ceftriaxone Psychotropic Info: Cymbalta daily, seroquel at bedtime, wellbutrin two times a day, latuda at bedtime, trazodone at bedtime. Insulin Sliding Scale Info: insulin aspart (novoLOG) injection 0-15 Units 3X daily with meals, insulin aspart (novoLOG) injection 0-5 Units daily at bedtime, insulin aspart (novoLOG) injection 12 Units 3x daily with meals       Current Medications (06/01/2020):  This is the current hospital active medication list Current Facility-Administered Medications  Medication Dose Route Frequency Provider Last Rate Last Admin  . acetaminophen (TYLENOL) tablet 650 mg  650 mg Oral Q6H PRN 06/03/2020, PA-C   650 mg at 05/31/20 1115   Or  . acetaminophen (TYLENOL) suppository 650 mg  650 mg Rectal Q6H PRN 06/02/20, PA-C      . amLODipine (NORVASC) tablet 10 mg  10 mg Oral Daily Despina Hidden,  PA-C   10 mg at 06/01/20 0937  . atorvastatin (LIPITOR) tablet 10 mg  10 mg Oral q1800 Ulyses Southward A, PA-C   10 mg at 05/31/20 1654  . bacitracin ointment 1 application  1 application Topical BID Despina Hidden, PA-C   1 application at 06/01/20 6283  . bisacodyl (DULCOLAX) suppository 10 mg  10 mg Rectal Daily PRN Despina Hidden, PA-C      . buPROPion [facility] SR) 12 hr tablet 200 mg  200 mg Oral BID  Ulyses Southward A, PA-C   200 mg at 06/01/20 1517  . cefTRIAXone (ROCEPHIN) 2 g in sodium chloride 0.9 % 100 mL IVPB  2 g Intravenous Q24H Joaquim Nam, RPH 200 mL/hr at 05/31/20 1325 2 g at 05/31/20 1325  . Chlorhexidine Gluconate Cloth 2 % PADS 6 each  6 each Topical Daily Despina Hidden, PA-C   6 each at 06/01/20 [phone]  . docusate sodium (COLACE) capsule 100 mg  100 mg Oral BID Ulyses Southward A, PA-C   100 mg at 06/01/20 7371  . DULoxetine (CYMBALTA) DR capsule 90 mg  90 mg Oral Daily [place], Eric J, DO   90 mg at 06/01/20 0626  . enoxaparin (LOVENOX) injection 75 mg  0.5 mg/kg Subcutaneous Q24H Ulyses Southward A, PA-C   75 mg at 06/01/20 9485  . furosemide (LASIX) tablet 40 mg  40 mg Oral Daily Ulyses Southward A, PA-C   40 mg at 06/01/20 4627  . hydrALAZINE (APRESOLINE) tablet 25 mg  25 mg Oral Q8H Ulyses Southward A, PA-C   25 mg at 06/01/20 0350  . HYDROmorphone (DILAUDID) injection 1 mg  1 mg Intravenous Q3H PRN Ulyses Southward A, PA-C   1 mg at 06/01/20 0636  . insulin aspart (novoLOG) injection 0-15 Units  0-15 Units Subcutaneous TID WC Despina Hidden, PA-C   3 Units at 06/01/20 1303  . insulin aspart (novoLOG) injection 0-5 Units  0-5 Units Subcutaneous QHS Ulyses Southward A, PA-C      . insulin aspart (novoLOG) injection 12 Units  12 Units Subcutaneous TID WC Marikay Alar, FNP   5 Units at 06/01/20 [phone]  . insulin glargine (LANTUS) injection 70 Units  70 Units Subcutaneous BID [place], Eric J, DO   70 Units at 06/01/20 0941  . ipratropium-albuterol (DUONEB) 0.5-2.5 (3) MG/3ML nebulizer solution 3 mL  3 mL Nebulization Q6H PRN Despina Hidden, PA-C      . lurasidone (LATUDA) tablet 20 mg  20 mg Oral QHS Ulyses Southward A, PA-C   20 mg at 05/31/20 2109  . methocarbamol (ROBAXIN) tablet 500 mg  500 mg Oral Q6H PRN Despina Hidden, PA-C   500 mg at 06/01/20 0325   Or  . methocarbamol (ROBAXIN) 500 mg in dextrose 5 % 50 mL IVPB  500 mg Intravenous Q6H PRN Despina Hidden, PA-C      . metoprolol  tartrate (LOPRESSOR) tablet 50 mg  50 mg Oral BID Ulyses Southward A, PA-C   50 mg at 06/01/20 1829  . mometasone-formoterol (DULERA) 100-5 MCG/ACT inhaler 2 puff  2 puff Inhalation BID Despina Hidden, PA-C   2 puff at 06/01/20 0847  . ondansetron (ZOFRAN) tablet 4 mg  4 mg Oral Q6H PRN Ulyses Southward A, PA-C       Or  . ondansetron [facility]) injection 4 mg  4 mg Intravenous Q6H PRN Ulyses Southward A, PA-C      . oxyCODONE (Oxy IR/ROXICODONE) immediate release tablet  10-15 mg  10-15 mg Oral Q4H PRN Despina Hidden, PA-C   15 mg at 06/01/20 0324  . polyethylene glycol (MIRALAX / GLYCOLAX) packet 17 g  17 g Oral Daily PRN Despina Hidden, PA-C      . QUEtiapine (SEROQUEL) tablet 100 mg  100 mg Oral QHS Ulyses Southward A, PA-C   100 mg at 05/31/20 2109  . traZODone (DESYREL) tablet 25 mg  25 mg Oral QHS PRN Despina Hidden, PA-C      . traZODone (DESYREL) tablet 50 mg  50 mg Oral QHS Ulyses Southward A, PA-C   50 mg at 05/31/20 2109     Discharge Medications: Please see discharge summary for a list of discharge medications.  Relevant Imaging Results:  Relevant Lab Results:   Additional Information ssn [id number]  Carmina Miller, LCSWA

## 2020-06-01 NOTE — TOC Initial Note (Addendum)
Transition of Care [facility]) - Initial/Assessment Note    Patient Details  Name: [name] MRN: [id number] Date of Birth: [birth year]  [facility]) CM/SW Contact:    Carmina Miller, LCSWA Phone Number: 06/01/2020, 12:04 PM  Clinical Narrative:                 CSW received consult for possible SNF placement at time of discharge. CSW spoke with patient regarding PT recommendation of SNF placement at time of discharge. Patient reported that she is unable to care for herself patient at their home given her current physical needs and fall risk. Patient expressed understanding of PT recommendation and is agreeable to SNF placement at time of discharge. Patient reports preference for [facility]. Patient stated that she needs a SNF near her home as she doesn't have family here. She didn't provide any other options as she said all the others near her had bad ratings. CSW discussed insurance authorization process and provided Medicare SNF ratings list. Patient expressed being hopeful for rehab and to feel better soon. Patient gave permission to speak with her daughter Dahlia Client at [phone], spoke with Dahlia Client and advised of dc plans. No further questions reported at this time. CSW to continue to follow and assist with discharge planning needs.    Barriers to Discharge: Continued Medical Work up, English as a second language teacher   Patient Goals and CMS Choice Patient states their goals for this hospitalization and ongoing recovery are:: Get better soon. CMS Medicare.gov Compare Post Acute Care list provided to:: Patient Choice offered to / list presented to : Patient  Expected Discharge Plan and Services   In-house Referral: Clinical Social Work     Living arrangements for the past 2 months: Single Family Home                                      Prior Living Arrangements/Services Living arrangements for the past 2 months: Single Family Home Lives with:: Self Patient  language and need for interpreter reviewed:: Yes Do you feel safe going back to the place where you live?: No   No one to help take care of  Need for Family Participation in Patient Care: Yes (Comment) Care giver support system in place?: No (comment)   Criminal Activity/Legal Involvement Pertinent to Current Situation/Hospitalization: No - Comment as needed  Activities of Daily Living Home Assistive Devices/Equipment: CBG Meter, Walker (specify type), Bedside commode/3-in-1, Dentures (specify type), Eyeglasses (front wheeled walker, upper/lower dentures, surgical shoe for right foot) ADL Screening (condition at time of admission) Patient's cognitive ability adequate to safely complete daily activities?: No (patient was recently sedated for procedure.  Can ask question but easily will fall alseep) Is the patient deaf or have difficulty hearing?: No Does the patient have difficulty seeing, even when wearing glasses/contacts?: No Does the patient have difficulty concentrating, remembering, or making decisions?: Yes Patient able to express need for assistance with ADLs?: Yes Does the patient have difficulty dressing or bathing?: Yes Independently performs ADLs?: No Communication: Independent Dressing (OT): Needs assistance Is this a change from baseline?: Pre-admission baseline Grooming: Needs assistance Is this a change from baseline?: Pre-admission baseline Feeding: Needs assistance Is this a change from baseline?: Pre-admission baseline Bathing: Needs assistance Is this a change from baseline?: Pre-admission baseline Toileting: Dependent Is this a change from baseline?: Change from baseline, expected to last >3days In/Out Bed: Dependent Is this a  change from baseline?: Change from baseline, expected to last >3 days Walks in Home: Dependent Is this a change from baseline?: Change from baseline, expected to last >3 days Does the patient have difficulty walking or climbing stairs?: Yes  (secondary to left ankle fracture and right toe amputation) Weakness of Legs: Both Weakness of Arms/Hands: None  Permission Sought/Granted Permission sought to share information with : Facility Medical sales representative, Family Supports Permission granted to share information with : Yes, Verbal Permission Granted  Share Information with NAME: Dahlia Client  Permission granted to share info w AGENCY: SNF  Permission granted to share info w Relationship: Daughter  Permission granted to share info w Contact Information: [phone]  Emotional Assessment Appearance:: Appears stated age Attitude/Demeanor/Rapport: Gracious Affect (typically observed): Accepting, Calm Orientation: : Oriented to Self, Oriented to Place, Oriented to  Time, Oriented to Situation Alcohol / Substance Use: Not Applicable Psych Involvement: No (comment)  Admission diagnosis:  Pain [R52] Closed left ankle fracture [S82.892A] Closed bimalleolar fracture of left ankle, initial encounter [S92.330Q] Patient Active Problem List   Diagnosis Date Noted  . Closed left ankle fracture 05/28/2020  . Moderate protein malnutrition (HCC)   . Adult BMI 50.0-59.9 kg/sq m (HCC)   . Normocytic anemia 04/21/2018  . Diabetic foot infection (HCC) 04/21/2018  . Mild renal insufficiency 04/21/2018  . Diabetic infection of right foot (HCC)   . Preventative health care 04/14/2011  . DYSMETABOLIC SYNDROME 12/08/2010  . ULNAR NEUROPATHY, BILATERAL 10/01/2010  . Insulin-requiring or dependent type II diabetes mellitus (HCC) 09/11/2010  . HYPERLIPIDEMIA 09/11/2010  . Morbid (severe) obesity due to excess calories (HCC) 09/11/2010  . TOBACCO USE 09/11/2010  . Depression with anxiety 09/11/2010  . Essential hypertension 09/11/2010  . GERD 09/11/2010  . PEPTIC ULCER DISEASE 09/11/2010  . COLONIC POLYPS, HX OF 09/11/2010  . Lumbosacral spondylosis 02/27/2010   PCP:  Wilmer Floor., MD Pharmacy:   [facility] [phone] Rosalita Levan,  [place] - [address] AT [facility] [address] [place] [address] Phone: [phone] Fax: [phone]     Social Determinants of Health (SDOH) Interventions    Readmission Risk Interventions No flowsheet data found.

## 2020-06-01 NOTE — Plan of Care (Signed)
  Problem: Activity: Goal: Risk for activity intolerance will decrease Outcome: Progressing   Problem: Coping: Goal: Level of anxiety will decrease Outcome: Progressing   Problem: Pain Managment: Goal: General experience of comfort will improve Outcome: Progressing   Problem: Safety: Goal: Ability to remain free from injury will improve Outcome: Progressing   Problem: Skin Integrity: Goal: Risk for impaired skin integrity will decrease Outcome: Progressing   

## 2020-06-01 NOTE — Progress Notes (Signed)
RE: [name] Date of Birth: Feb 18, 1961 Date: 06/01/20  Please be advised that the above-named patient will require a short-term nursing home stay - anticipated 30 days or less for rehabilitation and strengthening.  The plan is for return home.

## 2020-06-01 NOTE — Progress Notes (Signed)
PROGRESS NOTE    [name] Jeffersonmy C Hable  WJX:[id number] DOB: [birth year] DOA: 05/28/2020 PCP: Wilmer Floorampbell, Stephen D., MD    Brief Narrative:  [name] is a 59 y.o. female with history of mild nonobstructive CAD, COPD not on oxygen, diastolic CHF, IDDM-2 with neuropathy, morbid obesity, depression, tobacco use and recent hospitalization [facility] from 8/12-8/22 for right great toe osteomyelitis, non-STEMI and pulmonary edema presenting to ED due to left ankle pain after fall at home.  Patient was found to have bimalleolar fracture with dislocation of her left ankle.  Orthopedic surgery was consulted who recommended admission to [facility] for [facility] intervention.  TRH was consulted for admission.   Assessment & Plan:   Principal Problem:   Closed left ankle fracture Active Problems:   Insulin-requiring or dependent type II diabetes mellitus (HCC)   Morbid (severe) obesity due to excess calories (HCC)   TOBACCO USE   Depression with anxiety   Essential hypertension   Left bimalleolar ankle fracture with dislocation, closed traumatic. Patient presenting following mechanical fall at home with left ankle pain.  X-ray/CT left ankle notable for bimalleolar fracture with dislocation.  Patient underwent ORIF by orthopedics, Dr. Jena GaussHaddix on 05/29/2020. --Nonweightbearing left lower extremity --DVT prophylaxis with Lovenox while inpatient with recommendation of aspirin 325 mg twice daily on discharge --Pain control with oxycodone 5-10 mg every 4 hours as needed --Robaxin 500 mg every 6 hours as needed muscle spasms --PT/OT continues to work with patient.  She will benefit with inpatient therapies at the skilled nursing facility before going home and living independently.  Right great toe osteomyelitis s/p amputation [facility] on 05/21/2020.   Blood culture and surgical tissue cultures reportedly negative.  Wound culture with beta-hemolytic group B streptococcus.   Discharged on 05/26/2020 on IV ceftriaxone via PICC line for 4 weeks. --Continue IV ceftriaxone --Wound looks clean and healing. --Patient has antibiotic plans for 4 weeks, will continue Rocephin on discharge and she will have follow-up with her infectious [facility] to determine duration of antibiotics.  History of nonobstructive CAD/[facility] reported as normal coronaries and mild nonobstructive CAD.  No anginal symptoms.  High-sensitivity troponin negative.  --Continue metoprolol tartrate 50 mg p.o. twice daily  Chronic diastolic CHF:  [facility] reported as EF of 60 to 65% and no regional wall motion abnormalities.  On p.o. Lasix.  --Furosemide 40 mg p.o. daily --Strict I's and O's Daily weights  Chronic COPD: No formal PFT on file. Smoked cigarettes since age of 59. --Nebs as needed --Continue home ICS/LABA.  Uncontrolled IDDM-2 with hyperglycemia and neuropathy:  Recent A1c 8.5%.  Reports using Lantus 80 units twice daily with sliding scale insulin. --Blood sugars better today with Lantus to 70u BID --NovoLog 12 units 3 times daily AC --SSI-moderate dose for further coverage --Continue home statin --Continue home Lyrica --CBGs before every meal/at bedtime  Essential hypertension:  BP 131/58 this morning. --Amlodipine 10 mg p.o. daily --Hydralazine 25 mg every 8 hours --Furosemide 40 mg p.o. daily --Metoprolol tartrate 50 mg p.o. twice daily --Continue to closely monitor blood pressure  Normocytic anemia:  Hgb 11 (baseline 9-10).  --Follow CBC daily  Anxiety/depression:  --Duloxetine 90 mg p.o. daily --Latuda 20 mg p.o. daily --Trazodone 50 mg nightly --Seroquel 200 mg p.o. nightly  HLD: Continue atorvastatin 10 mg p.o. daily  Morbid obesity Body mass index is 54.07 kg/m.  Encouraged lifestyle changes/weight loss.  DVT prophylaxis:  Lovenox Code Status: Full code Family Communication: No family  present at bedside this morning  Disposition Plan:  Status is: Inpatient  Remains inpatient appropriate because:Ongoing active pain requiring inpatient pain management, Ongoing diagnostic testing needed not appropriate for outpatient work up, Unsafe d/c plan and Inpatient level of care appropriate due to severity of illness   Dispo: The patient is from: Home              Anticipated d/c is to: SNF              Anticipated d/c date is: When bed available.              Patient currently is medically stable to transfer to skilled level of care when bed available.    Consultants:   Orthopedics, Dr. Jena Gauss  Cardiology  Procedures:   ORIF left ankle fracture/dislocation 05/29/2020, Dr. Jena Gauss  Antimicrobials:   Perioperative vancomycin  Ceftriaxone   Subjective: Seen and examined.  No overnight events.  Pain is controlled.  Waiting to go to a skilled nursing facility.  Objective: Vitals:   05/31/20 2224 06/01/20 0403 06/01/20 0813 06/01/20 0847  BP:  129/60 (!) 126/58   Pulse: 81 79 84 83  Resp: 18 15 15 18   Temp:  98.3 F (36.8 C) 98.5 F (36.9 C)   TempSrc:  Oral Oral   SpO2: 96% 97% 96% 98%  Weight:      Height:        Intake/Output Summary (Last 24 hours) at 06/01/2020 1145 Last data filed at 06/01/2020 1000 Gross per 24 hour  Intake 780 ml  Output --  Net 780 ml   Filed Weights   05/27/20 2215 05/29/20 1116  Weight: (!) 152 kg (!) 152 kg    Examination:  Physical Exam Constitutional:      Comments: Patient looks comfortable on room air.  Not in any distress.  HENT:     Head: Normocephalic.     Mouth/Throat:     Mouth: Mucous membranes are moist.  Cardiovascular:     Rate and Rhythm: Normal rate and regular rhythm.  Pulmonary:     Breath sounds: Normal breath sounds.  Musculoskeletal:     Comments: Left foot on dressing, not removed by me. Right foot, with sutures intact.  Minimal induration surrounding the suture line.        Data  Reviewed: I have personally reviewed following labs and imaging studies  CBC: Recent Labs  Lab 05/28/20 0957 05/30/20 0147 05/31/20 0305  WBC 14.2* 9.2 10.3  HGB 11.3* 9.8* 9.5*  HCT 38.0 32.5* 31.8*  MCV 80.0 80.0 81.5  PLT 364 336 377   Basic Metabolic Panel: Recent Labs  Lab 05/28/20 0957 05/30/20 0147 05/31/20 0305  NA 132* 135 140  K 4.0 4.1 3.7  CL 93* 96* 102  CO2 27 27 29   GLUCOSE 223* 394* 131*  BUN 18 16 11   CREATININE 1.09* 1.15* 0.92  CALCIUM 8.8* 8.7* 8.9   GFR: Estimated Creatinine Clearance: 102.7 mL/min (by C-G formula based on SCr of 0.92 mg/dL). Liver Function Tests: No results for input(s): AST, ALT, ALKPHOS, BILITOT, PROT, ALBUMIN in the last 168 hours. No results for input(s): LIPASE, AMYLASE in the last 168 hours. No results for input(s): AMMONIA in the last 168 hours. Coagulation Profile: No results for input(s): INR, PROTIME in the last 168 hours. Cardiac Enzymes: No results for input(s): CKTOTAL, CKMB, CKMBINDEX, TROPONINI in the last 168 hours. BNP (last 3 results)  No results for input(s): PROBNP in the last 8760 hours. HbA1C: No results for input(s): HGBA1C in the last 72 hours. CBG: Recent Labs  Lab 05/31/20 0657 05/31/20 1620 05/31/20 2038 06/01/20 0634 06/01/20 0949  GLUCAP 128* 80 190* 120* 192*   Lipid Profile: No results for input(s): CHOL, HDL, LDLCALC, TRIG, CHOLHDL, LDLDIRECT in the last 72 hours. Thyroid Function Tests: No results for input(s): TSH, T4TOTAL, FREET4, T3FREE, THYROIDAB in the last 72 hours. Anemia Panel: No results for input(s): VITAMINB12, FOLATE, FERRITIN, TIBC, IRON, RETICCTPCT in the last 72 hours. Sepsis Labs: No results for input(s): PROCALCITON, LATICACIDVEN in the last 168 hours.  Recent Results (from the past 240 hour(s))  SARS Coronavirus 2 by RT PCR (hospital order, performed in [facility] hospital lab) Nasopharyngeal Nasopharyngeal Swab     Status: None   Collection Time: 05/28/20  9:57 AM    Specimen: Nasopharyngeal Swab  Result Value Ref Range Status   SARS Coronavirus 2 NEGATIVE NEGATIVE Final    Comment: (NOTE) SARS-CoV-2 target nucleic acids are NOT DETECTED.  The SARS-CoV-2 RNA is generally detectable in upper and lower respiratory specimens during the acute phase of infection. The lowest concentration of SARS-CoV-2 viral copies this assay can detect is 250 copies / mL. A negative result does not preclude SARS-CoV-2 infection and should not be used as the sole basis for treatment or other patient management decisions.  A negative result may occur with improper specimen collection / handling, submission of specimen other than nasopharyngeal swab, presence of viral mutation(s) within the areas targeted by this assay, and inadequate number of viral copies (<250 copies / mL). A negative result must be combined with clinical observations, patient history, and epidemiological information.  Fact Sheet for Patients:   BoilerBrush.com.cy  Fact Sheet for Healthcare Providers: https://pope.com/  This test is not yet approved or  cleared by the [place] FDA and has been authorized for detection and/or diagnosis of SARS-CoV-2 by FDA under an Emergency Use Authorization (EUA).  This EUA will remain in effect (meaning this test can be used) for the duration of the COVID-19 declaration under Section 564(b)(1) of the Act, 21 U.S.C. section 360bbb-3(b)(1), unless the authorization is terminated or revoked sooner.  Performed at [facility], [address], [place], [place] [address]   Surgical pcr screen     Status: None   Collection Time: 05/28/20  8:38 PM   Specimen: Nasal Mucosa; Nasal Swab  Result Value Ref Range Status   MRSA, PCR NEGATIVE NEGATIVE Final   Staphylococcus aureus NEGATIVE NEGATIVE Final    Comment: (NOTE) The Xpert SA Assay (FDA approved for NASAL specimens in patients 22 years of  age and older), is one component of a comprehensive surveillance program. It is not intended to diagnose infection nor to guide or monitor treatment. Performed at [facility], [address] [place], [place] [address]          Radiology Studies: No results found.      Scheduled Meds: . amLODipine  10 mg Oral Daily  . atorvastatin  10 mg Oral q1800  . bacitracin  1 application Topical BID  . buPROPion  200 mg Oral BID  . Chlorhexidine Gluconate Cloth  6 each Topical Daily  . docusate sodium  100 mg Oral BID  . DULoxetine  90 mg Oral Daily  . enoxaparin (LOVENOX) injection  0.5 mg/kg Subcutaneous Q24H  . furosemide  40 mg Oral Daily  . hydrALAZINE  25 mg  Oral Q8H  . insulin aspart  0-15 Units Subcutaneous TID WC  . insulin aspart  0-5 Units Subcutaneous QHS  . insulin aspart  12 Units Subcutaneous TID WC  . insulin glargine  70 Units Subcutaneous BID  . lurasidone  20 mg Oral QHS  . metoprolol tartrate  50 mg Oral BID  . mometasone-formoterol  2 puff Inhalation BID  . QUEtiapine  100 mg Oral QHS  . traZODone  50 mg Oral QHS   Continuous Infusions: . cefTRIAXone (ROCEPHIN)  IV 2 g (05/31/20 1325)  . methocarbamol (ROBAXIN) IV       LOS: 4 days    Time spent: 30 minutes

## 2020-06-02 DIAGNOSIS — T148XXA Other injury of unspecified body region, initial encounter: Secondary | ICD-10-CM

## 2020-06-02 LAB — GLUCOSE, CAPILLARY
Glucose-Capillary: 84 mg/dL (ref 70–99)
Glucose-Capillary: 101 mg/dL — ABNORMAL HIGH (ref 70–99)
Glucose-Capillary: 195 mg/dL — ABNORMAL HIGH (ref 70–99)

## 2020-06-02 NOTE — Progress Notes (Signed)
Pt encouraged to get out of bed/ambulate to chair but refused.

## 2020-06-02 NOTE — Progress Notes (Signed)
PROGRESS NOTE    [name]  ZOX:[id number] DOB: 1961-05-07 DOA: 05/28/2020 PCP: Wilmer Floorampbell, Stephen D., MD    Brief Narrative:  [name] is a 59 y.o. female with history of mild nonobstructive CAD, COPD not on oxygen, diastolic CHF, IDDM-2 with neuropathy, morbid obesity, depression, tobacco use and recent hospitalization [facility] from 8/12-8/22 for right great toe osteomyelitis, non-STEMI and pulmonary edema presenting to ED due to left ankle pain after fall at home.  Patient was found to have bimalleolar fracture with dislocation of her left ankle.  Orthopedic surgery was consulted who recommended admission [facility].  TRH was consulted for admission.   Assessment & Plan:   Principal Problem:   Closed left ankle fracture Active Problems:   Insulin-requiring or dependent type II diabetes mellitus (HCC)   Morbid (severe) obesity due to excess calories (HCC)   TOBACCO USE   Depression with anxiety   Essential hypertension   Left bimalleolar ankle fracture with dislocation, closed traumatic. Patient presenting following mechanical fall at home with left ankle pain.  X-ray/CT left ankle notable for bimalleolar fracture with dislocation.  Patient underwent ORIF by orthopedics, Dr. Jena GaussHaddix on 05/29/2020. --Nonweightbearing left lower extremity --DVT prophylaxis with Lovenox while inpatient with recommendation of aspirin 325 mg twice daily on discharge --Pain control with oxycodone 5-10 mg every 4 hours as needed --Robaxin 500 mg every 6 hours as needed muscle spasms --PT/OT continues to work with patient.  She will benefit with inpatient therapies at the skilled nursing facility before going home and living independently.  Right great toe osteomyelitis [facility]/p [facility] on 05[facility]21/2020.   Blood culture and surgical tissue cultures reportedly negative.  Wound culture with beta-hemolytic group B streptococcus.   Discharged on 05/26/2020 on IV ceftriaxone via PICC line for 4 weeks. --Continue IV ceftriaxone --Wound looks clean and healing. --Patient has antibiotic plans for 4 weeks, will continue Rocephin on discharge and she will have follow-up with [facility] to determine further duration of antibiotics.  History of nonobstructive CAD/[facility] reported as normal coronaries and mild nonobstructive CAD.  No anginal symptoms.  High-sensitivity troponin negative.  --Continue metoprolol tartrate 50 mg p.o. twice daily  Chronic diastolic CHF:  [facility] as EF of 60 to 65% and no regional wall motion abnormalities.  On p.o. Lasix.  --Volume status appears compensated --Furosemide 40 mg p.o. daily --Strict I's and O's Daily weights  Chronic COPD: No formal PFT on file. Smoked cigarettes since age of 59. --Nebs as needed --Continue home ICS/LABA.  Uncontrolled IDDM-2 with hyperglycemia and neuropathy:  Recent A1c 8.5%.  Reports using Lantus 80 units twice daily with sliding scale insulin. --Blood sugars better today with Lantus to 70u BID --NovoLog 12 units 3 times daily AC --SSI-moderate dose for further coverage --Continue home statin --Continue home Lyrica --CBGs before every meal/at bedtime  Essential hypertension:  BP 131/58 this morning. --Amlodipine 10 mg p.o. daily --Hydralazine 25 mg every 8 hours --Furosemide 40 mg p.o. daily --Metoprolol tartrate 50 mg p.o. twice daily --Continue to closely monitor blood pressure  Normocytic anemia:  Hgb 11 (baseline 9-10).  --Follow CBC daily  Anxiety/depression:  --Duloxetine 90 mg p.o. daily --Latuda 20 mg p.o. daily --Trazodone 50 mg nightly --Seroquel 200 mg p.o. nightly  HLD: Continue atorvastatin 10 mg p.o. daily  Morbid obesity Body mass index is 54.07 kg/m.  Encouraged lifestyle  changes/weight loss.  DVT prophylaxis: Lovenox Code Status:  Full code Family Communication: No family present at bedside this morning  Disposition Plan:  Status is: Inpatient  Remains inpatient appropriate because:Ongoing active pain requiring inpatient pain management, Ongoing diagnostic testing needed not appropriate for outpatient work up, Unsafe d/c plan and Inpatient level of care appropriate due to severity of illness   Dispo: The patient is from: Home              Anticipated d/c is to: SNF              Anticipated d/c date is: When bed available.              Patient currently is medically stable to transfer to skilled level of care when bed available.    Consultants:   Orthopedics, Dr. Jena Gauss  Cardiology  Procedures:   ORIF left ankle fracture/dislocation 05/29/2020, Dr. Jena Gauss  Antimicrobials:   Perioperative vancomycin  Ceftriaxone   Subjective: Reports continued pain in her legs. No nausea or vomiting  Objective: Vitals:   06/01/20 2240 06/02/20 0820 06/02/20 0857 06/02/20 1457  BP:  137/62  (!) 137/52  Pulse: 95 85  76  Resp: 18 16  17   Temp:  98.2 F (36.8 C)  98.2 F (36.8 C)  TempSrc:  Oral  Oral  SpO2: 97% 92% 95% 92%  Weight:      Height:        Intake/Output Summary (Last 24 hours) at 06/02/2020 1726 Last data filed at 06/02/2020 0940 Gross per 24 hour  Intake 240 ml  Output --  Net 240 ml   Filed Weights   05/27/20 2215 05/29/20 1116  Weight: (!) 152 kg (!) 152 kg    Examination:  General exam: Alert, awake, oriented x 3 Respiratory system: Clear to auscultation. Respiratory effort normal. Cardiovascular system:RRR. No murmurs, rubs, gallops. Gastrointestinal system: Abdomen is nondistended, soft and nontender. No organomegaly or masses felt. Normal bowel sounds heard. Central nervous system: Alert and oriented. No focal neurological deficits. Extremities: left foot in dressing. Right lower leg in brace Skin: No rashes, lesions or ulcers Psychiatry: Judgement and insight appear normal.  Mood & affect appropriate.     Data Reviewed: I have personally reviewed following labs and imaging studies  CBC: Recent Labs  Lab 05/28/20 0957 05/30/20 0147 05/31/20 0305  WBC 14.2* 9.2 10.3  HGB 11.3* 9.8* 9.5*  HCT 38.0 32.5* 31.8*  MCV 80.0 80.0 81.5  PLT 364 336 377   Basic Metabolic Panel: Recent Labs  Lab 05/28/20 0957 05/30/20 0147 05/31/20 0305  NA 132* 135 140  K 4.0 4.1 3.7  CL 93* 96* 102  CO2 27 27 29   GLUCOSE 223* 394* 131*  BUN 18 16 11   CREATININE 1.09* 1.15* 0.92  CALCIUM 8.8* 8.7* 8.9   GFR: Estimated Creatinine Clearance: 102.7 mL/min (by C-G formula based on SCr of 0.92 mg/dL). Liver Function Tests: No results for input(s): AST, ALT, ALKPHOS, BILITOT, PROT, ALBUMIN in the last 168 hours. No results for input(s): LIPASE, AMYLASE in the last 168 hours. No results for input(s): AMMONIA in the last 168 hours. Coagulation Profile: No results for input(s): INR, PROTIME in the last 168 hours. Cardiac Enzymes: No results for input(s): CKTOTAL, CKMB, CKMBINDEX, TROPONINI in the last 168 hours. BNP (last 3 results) No results for input(s): PROBNP in the last 8760 hours. HbA1C: No results for input(s): HGBA1C in the last 72 hours. CBG: Recent Labs  Lab 06/01/20 1702  06/01/20 1945 06/02/20 0634 06/02/20 1136 06/02/20 1618  GLUCAP 159* 197* 84 101* 195*   Lipid Profile: No results for input(s): CHOL, HDL, LDLCALC, TRIG, CHOLHDL, LDLDIRECT in the last 72 hours. Thyroid Function Tests: No results for input(s): TSH, T4TOTAL, FREET4, T3FREE, THYROIDAB in the last 72 hours. Anemia Panel: No results for input(s): VITAMINB12, FOLATE, FERRITIN, TIBC, IRON, RETICCTPCT in the last 72 hours. Sepsis Labs: No results for input(s): PROCALCITON, LATICACIDVEN in the last 168 hours.  Recent Results (from the past 240 hour(s))  SARS Coronavirus 2 by RT PCR (hospital order, performed in [facility] hospital lab) Nasopharyngeal Nasopharyngeal Swab     Status:  None   Collection Time: 05/28/20  9:57 AM   Specimen: Nasopharyngeal Swab  Result Value Ref Range Status   SARS Coronavirus 2 NEGATIVE NEGATIVE Final    Comment: (NOTE) SARS-CoV-2 target nucleic acids are NOT DETECTED.  The SARS-CoV-2 RNA is generally detectable in upper and lower respiratory specimens during the acute phase of infection. The lowest concentration of SARS-CoV-2 viral copies this assay can detect is 250 copies / mL. A negative result does not preclude SARS-CoV-2 infection and should not be used as the sole basis for treatment or other patient management decisions.  A negative result may occur with improper specimen collection / handling, submission of specimen other than nasopharyngeal swab, presence of viral mutation(s) within the areas targeted by this assay, and inadequate number of viral copies (<250 copies / mL). A negative result must be combined with clinical observations, patient history, and epidemiological information.  Fact Sheet for Patients:   BoilerBrush.com.cy  Fact Sheet for Healthcare Providers: https://pope.com/  This test is not yet approved or  cleared by the [place] FDA and has been authorized for detection and/or diagnosis of SARS-CoV-2 by FDA under an Emergency Use Authorization (EUA).  This EUA will remain in effect (meaning this test can be used) for the duration of the COVID-19 declaration under Section 564(b)(1) of the Act, 21 U.S.C. section 360bbb-3(b)(1), unless the authorization is terminated or revoked sooner.  Performed at [facility], [address], [place], [place] [address]   Surgical pcr screen     Status: None   Collection Time: 05/28/20  8:38 PM   Specimen: Nasal Mucosa; Nasal Swab  Result Value Ref Range Status   MRSA, PCR NEGATIVE NEGATIVE Final   Staphylococcus aureus NEGATIVE NEGATIVE Final    Comment: (NOTE) The Xpert SA Assay (FDA approved  for NASAL specimens in patients 25 years of age and older), is one component of a comprehensive surveillance program. It is not intended to diagnose infection nor to guide or monitor treatment. Performed at [facility], [address] [place], [place] [address]          Radiology Studies: No results found.      Scheduled Meds: . amLODipine  10 mg Oral Daily  . atorvastatin  10 mg Oral q1800  . bacitracin  1 application Topical BID  . buPROPion  200 mg Oral BID  . Chlorhexidine Gluconate Cloth  6 each Topical Daily  . docusate sodium  100 mg Oral BID  . DULoxetine  90 mg Oral Daily  . enoxaparin (LOVENOX) injection  0.5 mg/kg Subcutaneous Q24H  . furosemide  40 mg Oral Daily  . hydrALAZINE  25 mg Oral Q8H  . insulin aspart  0-15 Units Subcutaneous TID WC  . insulin aspart  0-5 Units Subcutaneous QHS  . insulin aspart  12 Units  Subcutaneous TID WC  . insulin glargine  70 Units Subcutaneous BID  . lurasidone  20 mg Oral QHS  . metoprolol tartrate  50 mg Oral BID  . mometasone-formoterol  2 puff Inhalation BID  . QUEtiapine  100 mg Oral QHS  . traZODone  50 mg Oral QHS   Continuous Infusions: . cefTRIAXone (ROCEPHIN)  IV 2 g (06/02/20 1403)  . methocarbamol (ROBAXIN) IV       LOS: 5 days    Time spent: 30 minutes

## 2020-06-03 DIAGNOSIS — I10 Essential (primary) hypertension: Secondary | ICD-10-CM

## 2020-06-03 DIAGNOSIS — Z794 Long term (current) use of insulin: Secondary | ICD-10-CM

## 2020-06-03 DIAGNOSIS — E119 Type 2 diabetes mellitus without complications: Secondary | ICD-10-CM

## 2020-06-03 LAB — BASIC METABOLIC PANEL
Anion gap: 12 (ref 5–15)
BUN: 14 mg/dL (ref 6–20)
CO2: 25 mmol/L (ref 22–32)
Calcium: 9.1 mg/dL (ref 8.9–10.3)
Chloride: 101 mmol/L (ref 98–111)
Creatinine, Ser: 1.07 mg/dL — ABNORMAL HIGH (ref 0.44–1.00)
GFR calc Af Amer: >60 mL/min (ref >60)
GFR calc non Af Amer: 58 mL/min — ABNORMAL LOW (ref >60)
Glucose, Bld: 163 mg/dL — ABNORMAL HIGH (ref 70–99)
Potassium: 3.9 mmol/L (ref 3.5–5.1)
Sodium: 138 mmol/L (ref 135–145)

## 2020-06-03 LAB — CBC
HCT: 33.3 % — ABNORMAL LOW (ref 36.0–46.0)
Hemoglobin: 10 g/dL — ABNORMAL LOW (ref 12.0–15.0)
MCH: 24.3 pg — ABNORMAL LOW (ref 26.0–34.0)
MCHC: 30 g/dL (ref 30.0–36.0)
MCV: 81 fL (ref 80.0–100.0)
Platelets: 398 10*3/uL (ref 150–400)
RBC: 4.11 MIL/uL (ref 3.87–5.11)
RDW: 20.3 % — ABNORMAL HIGH (ref 11.5–15.5)
WBC: 9.6 10*3/uL (ref 4.0–10.5)
nRBC: 0 % (ref 0.0–0.2)

## 2020-06-03 LAB — GLUCOSE, CAPILLARY
Glucose-Capillary: 152 mg/dL — ABNORMAL HIGH (ref 70–99)
Glucose-Capillary: 182 mg/dL — ABNORMAL HIGH (ref 70–99)
Glucose-Capillary: 107 mg/dL — ABNORMAL HIGH (ref 70–99)
Glucose-Capillary: 123 mg/dL — ABNORMAL HIGH (ref 70–99)
Glucose-Capillary: 70 mg/dL (ref 70–99)
Glucose-Capillary: 199 mg/dL — ABNORMAL HIGH (ref 70–99)

## 2020-06-03 MED ORDER — INSULIN ASPART 100 UNIT/ML ~~LOC~~ SOLN
6.0000 [IU] | Freq: Three times a day (TID) | SUBCUTANEOUS | Status: DC
  Administered 2020-06-03 – 2020-06-04 (×4): 6 [IU] via SUBCUTANEOUS

## 2020-06-03 NOTE — Plan of Care (Signed)

## 2020-06-03 NOTE — Plan of Care (Signed)
  Problem: Activity: Goal: Risk for activity intolerance will decrease Outcome: Progressing   Problem: Coping: Goal: Level of anxiety will decrease Outcome: Progressing   Problem: Pain Managment: Goal: General experience of comfort will improve Outcome: Progressing   Problem: Safety: Goal: Ability to remain free from injury will improve Outcome: Progressing   Problem: Skin Integrity: Goal: Risk for impaired skin integrity will decrease Outcome: Progressing   

## 2020-06-03 NOTE — Progress Notes (Signed)
PROGRESS NOTE    [name]  ZOX:[id number] DOB: 1961-08-05 DOA: 05/28/2020 PCP: Wilmer Floor., MD    Brief Narrative:  [name] is a 59 y.o. female with history of mild nonobstructive CAD, COPD not on oxygen, diastolic CHF, IDDM-2 with neuropathy, morbid obesity, depression, tobacco use and recent hospitalization [facility] from 8/12-8/22 for right great toe osteomyelitis, non-STEMI and pulmonary edema presenting to ED due to left ankle pain after fall at home.  Patient was found to have bimalleolar fracture with dislocation of her left ankle.  Orthopedic surgery was consulted who recommended admission to [facility] for surgical intervention.  [facility] was consulted for admission.   Assessment & Plan:   Principal Problem:   Closed left ankle fracture Active Problems:   Insulin-requiring or dependent type II diabetes mellitus (HCC)   Morbid (severe) obesity due to excess calories (HCC)   TOBACCO USE   Depression with anxiety   Essential hypertension   Left bimalleolar ankle fracture with dislocation, closed traumatic. Patient presenting following mechanical fall at home with left ankle pain.  X-ray/CT left ankle notable for bimalleolar fracture with dislocation.  Patient underwent ORIF by orthopedics, Dr. Jena Gauss on 05/29/2020. --Nonweightbearing left lower extremity --DVT prophylaxis with Lovenox while inpatient with recommendation of aspirin 325 mg twice daily on discharge --Pain control with oxycodone 5-10 mg every 4 hours as needed --Robaxin 500 mg every 6 hours as needed muscle spasms --PT/OT continues to work with patient.  She will benefit with inpatient therapies at the skilled nursing facility before going home and living independently. Patient remains stable.  Pain is adequately controlled.  Right great toe osteomyelitis s/p amputation at [facility] on 05/21/2020.   Blood culture and surgical tissue cultures reportedly negative.  Wound  culture with beta-hemolytic group B streptococcus.  Discharged on 05/26/2020 on IV ceftriaxone via PICC line for 4 weeks. --Continue IV ceftriaxone --Wound looks clean and healing. --Patient has antibiotic plans for 4 weeks, will continue Rocephin on discharge and she will have follow-up with her infectious disease provider from [facility] to determine further duration of antibiotics. Her second toe has also been erythematous.  The nail came off 2 days ago per patient.  Some erythema is noted.  She has good pulses.  Continue antibiotics for now.  Outpatient follow-up with her providers.  History of nonobstructive CAD/recent non-STEMI LHC at [facility] on 05/17/2020 reported as normal coronaries and mild nonobstructive CAD.  No anginal symptoms.  High-sensitivity troponin negative.  --Continue metoprolol tartrate 50 mg p.o. twice daily Is reasonably well controlled.  Chronic diastolic CHF:  Echo at [facility] reported as EF of 60 to 65% and no regional wall motion abnormalities.  On p.o. Lasix.  --Volume status appears compensated --Furosemide 40 mg p.o. daily --Strict I's and O's Daily weights Potassium is 3.9 today.  Renal function stable.  Chronic COPD: No formal PFT on file. Smoked cigarettes since age of 37. --Nebs as needed --Continue home ICS/LABA.  Uncontrolled IDDM-2 with hyperglycemia and neuropathy:  Recent A1c 8.5%.  Reports using Lantus 80 units twice daily with sliding scale insulin. Currently on Lantus 70 units daily.  On SSI as well.  Monitor CBGs.  Essential hypertension:  --Amlodipine 10 mg p.o. daily --Hydralazine 25 mg every 8 hours --Furosemide 40 mg p.o. daily --Metoprolol tartrate 50 mg p.o. twice daily --Continue to closely monitor blood pressure.  Noted to be stable.  Normocytic anemia:  Hemoglobin stable.  No evidence of overt blood loss.  Anxiety/depression:  --Duloxetine 90 mg p.o. daily --Latuda 20 mg p.o. daily --Trazodone 50 mg nightly --Seroquel 200 mg  p.o. nightly  HLD: Continue atorvastatin 10 mg p.o. daily  Morbid obesity Body mass index is 54.07 kg/m.  Encouraged lifestyle changes/weight loss.  DVT prophylaxis: Lovenox Code Status: Full code Family Communication: Discussed with the patient  Disposition Plan:  Status is: Inpatient  Remains inpatient appropriate because:Ongoing active pain requiring inpatient pain management, Ongoing diagnostic testing needed not appropriate for outpatient work up, Unsafe d/c plan and Inpatient level of care appropriate due to severity of illness   Dispo: The patient is from: Home              Anticipated d/c is to: SNF              Anticipated d/c date is: When bed available.              Patient currently is medically stable to transfer to skilled level of care when bed available.    Consultants:   Orthopedics, Dr. Jena Gauss  Cardiology  Procedures:   ORIF left ankle fracture/dislocation 05/29/2020, Dr. Jena Gauss  Antimicrobials:   Perioperative vancomycin  Ceftriaxone   Subjective: Patient is reasonably well controlled.  Concerned about right foot and her right second toe.  Objective: Vitals:   06/03/20 0300 06/03/20 0500 06/03/20 0750 06/03/20 0924  BP: 135/73  118/62   Pulse: 74  74   Resp: 17  17   Temp: 98.5 F (36.9 C)  98.4 F (36.9 C)   TempSrc: Oral  Oral   SpO2: 95%  94% 94%  Weight:  (!) 152 kg    Height:        Intake/Output Summary (Last 24 hours) at 06/03/2020 1136 Last data filed at 06/03/2020 1048 Gross per 24 hour  Intake 960 ml  Output --  Net 960 ml   Filed Weights   05/27/20 2215 05/29/20 1116 06/03/20 0500  Weight: (!) 152 kg (!) 152 kg (!) 152 kg    Examination:  General appearance: Awake alert.  In no distress.  Obese Resp: Clear to auscultation bilaterally.  Normal effort Cardio: S1-S2 is normal regular.  No S3-S4.  No rubs murmurs or bruit GI: Abdomen is soft.  Nontender nondistended.  Bowel sounds are present normal.  No masses  organomegaly Extremities: Left lower extremity covered in dressing.  Right foot covered in dressing.  Right second toe shows erythema.  Dry scab noted on the tip. Neurologic: Alert and oriented x3.  No focal neurological deficits.      Data Reviewed: I have personally reviewed following labs and imaging studies  CBC: Recent Labs  Lab 05/28/20 0957 05/30/20 0147 05/31/20 0305 06/03/20 0247  WBC 14.2* 9.2 10.3 9.6  HGB 11.3* 9.8* 9.5* 10.0*  HCT 38.0 32.5* 31.8* 33.3*  MCV 80.0 80.0 81.5 81.0  PLT 364 336 377 398   Basic Metabolic Panel: Recent Labs  Lab 05/28/20 0957 05/30/20 0147 05/31/20 0305 06/03/20 0247  NA 132* 135 140 138  K 4.0 4.1 3.7 3.9  CL 93* 96* 102 101  CO2 27 27 29 25   GLUCOSE 223* 394* 131* 163*  BUN 18 16 11 14   CREATININE 1.09* 1.15* 0.92 1.07*  CALCIUM 8.8* 8.7* 8.9 9.1   GFR: Estimated Creatinine Clearance: 88.3 mL/min (A) (by C-G formula based on SCr of 1.07 mg/dL (H)).  CBG: Recent Labs  Lab 06/01/20 1945 06/02/20 0634 06/02/20 1136 06/02/20 1618 06/03/20 0637  GLUCAP 197*  84 101* 195* 107*     Recent Results (from the past 240 hour(s))  SARS Coronavirus 2 by RT PCR (hospital order, performed in [facility] hospital lab) Nasopharyngeal Nasopharyngeal Swab     Status: None   Collection Time: 05/28/20  9:57 AM   Specimen: Nasopharyngeal Swab  Result Value Ref Range Status   SARS Coronavirus 2 NEGATIVE NEGATIVE Final    Comment: (NOTE) SARS-CoV-2 target nucleic acids are NOT DETECTED.  The SARS-CoV-2 RNA is generally detectable in upper and lower respiratory specimens during the acute phase of infection. The lowest concentration of SARS-CoV-2 viral copies this assay can detect is 250 copies / mL. A negative result does not preclude SARS-CoV-2 infection and should not be used as the sole basis for treatment or other patient management decisions.  A negative result may occur with improper specimen collection / handling, submission  of specimen other than nasopharyngeal swab, presence of viral mutation(s) within the areas targeted by this assay, and inadequate number of viral copies (<250 copies / mL). A negative result must be combined with clinical observations, patient history, and epidemiological information.  Fact Sheet for Patients:   BoilerBrush.com.cy  Fact Sheet for Healthcare Providers: https://pope.com/  This test is not yet approved or  cleared by the [place] FDA and has been authorized for detection and/or diagnosis of SARS-CoV-2 by FDA under an Emergency Use Authorization (EUA).  This EUA will remain in effect (meaning this test can be used) for the duration of the COVID-19 declaration under Section 564(b)(1) of the Act, 21 U.S.C. section 360bbb-3(b)(1), unless the authorization is terminated or revoked sooner.  Performed at [facility], [address], [place], [place] [address]   Surgical pcr screen     Status: None   Collection Time: 05/28/20  8:38 PM   Specimen: Nasal Mucosa; Nasal Swab  Result Value Ref Range Status   MRSA, PCR NEGATIVE NEGATIVE Final   Staphylococcus aureus NEGATIVE NEGATIVE Final    Comment: (NOTE) The Xpert SA Assay (FDA approved for NASAL specimens in patients 37 years of age and older), is one component of a comprehensive surveillance program. It is not intended to diagnose infection nor to guide or monitor treatment. Performed at [facility], [address], [place], [place] [address]          Radiology Studies: No results found.      Scheduled Meds: . amLODipine  10 mg Oral Daily  . atorvastatin  10 mg Oral q1800  . bacitracin  1 application Topical BID  . buPROPion  200 mg Oral BID  . Chlorhexidine Gluconate Cloth  6 each Topical Daily  . docusate sodium  100 mg Oral BID  . DULoxetine  90 mg Oral Daily  . enoxaparin (LOVENOX) injection  0.5 mg/kg Subcutaneous Q24H    . furosemide  40 mg Oral Daily  . hydrALAZINE  25 mg Oral Q8H  . insulin aspart  0-15 Units Subcutaneous TID WC  . insulin aspart  0-5 Units Subcutaneous QHS  . insulin aspart  6 Units Subcutaneous TID WC  . insulin glargine  70 Units Subcutaneous BID  . lurasidone  20 mg Oral QHS  . metoprolol tartrate  50 mg Oral BID  . mometasone-formoterol  2 puff Inhalation BID  . QUEtiapine  100 mg Oral QHS  . traZODone  50 mg Oral QHS   Continuous Infusions: . cefTRIAXone (ROCEPHIN)  IV 2 g (06/02/20 1403)  . methocarbamol (ROBAXIN) IV  LOS: 6 days    Osvaldo ShipperGokul Roch Quach 06/03/2020

## 2020-06-03 NOTE — Social Work (Addendum)
Pts was granted 30 day or less passr number [id number].   Jimmy Picket, Theresia Majors, [place] Clinical Social Worker [phone]

## 2020-06-03 NOTE — Consult Note (Signed)
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 Patient is 10 days post RGT amputation at [facility]. Clean dry approximated suture line at that site.  Now with leg fracture and seeing [facility] (Dr. Jena Gauss). Wound type:neuropathic Pressure Injury POA: N/A Measurement:1.2cm round x 0.1cm Wound WGY:KZLD, moist Drainage (amount, consistency, odor) serous Periwound:erythematous, warm Dressing procedure/placement/frequency: I will provide Nursing with guidance for the care of the 2nd digit ulcer using soap and water to cleanse, NS to rinse and a nonadherent antimicrobial (xeroform) to cover. The suture line at the RGT amputation site will be painted with betadine solution once daily. She is to ambulate with an ortho shoe.  Patient to follow up with surgeon from [facility] when possible.  WOC nursing team will not follow, but will remain available to this patient, the nursing and medical teams.  Please re-consult if needed. Thanks, Ladona Mow, MSN, RN, GNP, [place]  Pager# [phone]

## 2020-06-03 NOTE — Progress Notes (Signed)
Physical Therapy Treatment Patient Details Name: [name] MRN: [id number] DOB: [birth year] Today's Date: 06/03/2020    History of Present Illness Pt is a 59 year old woman admitted after fall resulting in L ankle fx. Underwent ORIF. Pt with recent admission to [facility] for R great toe amputation (WBAT with RW). PMH: HTN, COPD, DM, peripheral neuropathy, CAD, STEMI, anxiety, depression, morbid obesity.    PT Comments    Pt lying supine in bed and required max cues for encouragement to participate in PT session this am.  Pt following commands well and required decreased assistance from elevated surface.  Pt is slow but capable to participate in post acute PT to improve strength and function before returning home.   Of note: R 2nd toe is swollen, red, and bleeding with WBAT in post op shoe.    Follow Up Recommendations  SNF     Equipment Recommendations  Wheelchair cushion (measurements PT);Wheelchair (measurements PT);3in1 (PT) (bariatric Pt is over 300#)    Recommendations for Other Services       Precautions / Restrictions Precautions Precautions: Fall Required Braces or Orthoses: Other Brace Other Brace: RIGHT post op shoe Restrictions Weight Bearing Restrictions: Yes LLE Weight Bearing: Non weight bearing Other Position/Activity Restrictions: R WBAT in post op shoe    Mobility  Bed Mobility Overal bed mobility: Modified Independent                Transfers Overall transfer level: Needs assistance Equipment used: Rolling walker (2 wheeled) Transfers: Sit to/from Stand Sit to Stand: From elevated surface   Squat pivot transfers: Min guard     General transfer comment: Pt stood x 3 trials.  Cues for hand placement and bed elevated high to improve ease and instill confidence.  Pt able to maintain LNWB.  Pt stood for 10 sec. 15 sec and 20 secs.   She fatigues very quickly and lack strength and functional capacity to progress to gt  training.  Ambulation/Gait Ambulation/Gait assistance:  (NT-unable.)               Stairs             Wheelchair Mobility    Modified Rankin (Stroke Patients Only)       Balance Overall balance assessment: Needs assistance   Sitting balance-Leahy Scale: Good       Standing balance-Leahy Scale: Poor                              Cognition Arousal/Alertness: Awake/alert Behavior During Therapy: Flat affect Overall Cognitive Status: Within Functional Limits for tasks assessed                                        Exercises General Exercises - Lower Extremity Long Arc Quad: AROM;Left;Seated;20 reps (10 reps x 2 sets)    General Comments        Pertinent Vitals/Pain Pain Assessment: Faces Faces Pain Scale: Hurts even more Pain Location: L LE Pain Descriptors / Indicators: Throbbing Pain Intervention(s): Monitored during session;Repositioned    Home Living                      Prior Function            PT Goals (current goals can now be found in the care plan  section) Acute Rehab PT Goals Patient Stated Goal: to avoid another fall PT Goal Formulation: With patient Potential to Achieve Goals: Good Progress towards PT goals: Progressing toward goals    Frequency    Min 3X/week      PT Plan Current plan remains appropriate    Co-evaluation              AM-PAC PT "6 Clicks" Mobility   Outcome Measure  Help needed turning from your back to your side while in a flat bed without using bedrails?: None Help needed moving from lying on your back to sitting on the side of a flat bed without using bedrails?: None Help needed moving to and from a bed to a chair (including a wheelchair)?: A Little Help needed standing up from a chair using your arms (e.g., wheelchair or bedside chair)?: A Little Help needed to walk in hospital room?: Total Help needed climbing 3-5 steps with a railing? : Total 6 Click  Score: 16    End of Session Equipment Utilized During Treatment: Gait belt Activity Tolerance: Patient tolerated treatment well Patient left: in chair;with call bell/phone within reach;with chair alarm set Nurse Communication: Mobility status;Need for lift equipment (informed nurse of tender, inflammed R 2nd toe that is oozing blood.) PT Visit Diagnosis: Pain;Other abnormalities of gait and mobility (R26.89);History of falling (Z91.81) Pain - Right/Left: Left Pain - part of body: Ankle and joints of foot     Time: 1211-1225 PT Time Calculation (min) (ACUTE ONLY): 14 min  Charges:  $Therapeutic Activity: 8-22 mins                     [name] , PTA Acute Rehabilitation Services Pager [phone] Office [phone]     [name] 06/03/2020, 12:40 PM

## 2020-06-04 DIAGNOSIS — S82842A Displaced bimalleolar fracture of left lower leg, initial encounter for closed fracture: Secondary | ICD-10-CM | POA: Diagnosis not present

## 2020-06-04 DIAGNOSIS — E1142 Type 2 diabetes mellitus with diabetic polyneuropathy: Secondary | ICD-10-CM | POA: Diagnosis not present

## 2020-06-04 DIAGNOSIS — I10 Essential (primary) hypertension: Secondary | ICD-10-CM | POA: Diagnosis not present

## 2020-06-04 DIAGNOSIS — S72142A Displaced intertrochanteric fracture of left femur, initial encounter for closed fracture: Secondary | ICD-10-CM | POA: Diagnosis not present

## 2020-06-04 DIAGNOSIS — G894 Chronic pain syndrome: Secondary | ICD-10-CM | POA: Diagnosis not present

## 2020-06-04 DIAGNOSIS — S91105A Unspecified open wound of left lesser toe(s) without damage to nail, initial encounter: Secondary | ICD-10-CM | POA: Diagnosis not present

## 2020-06-04 DIAGNOSIS — Z743 Need for continuous supervision: Secondary | ICD-10-CM | POA: Diagnosis not present

## 2020-06-04 DIAGNOSIS — S72142D Displaced intertrochanteric fracture of left femur, subsequent encounter for closed fracture with routine healing: Secondary | ICD-10-CM | POA: Diagnosis not present

## 2020-06-04 DIAGNOSIS — R262 Difficulty in walking, not elsewhere classified: Secondary | ICD-10-CM | POA: Diagnosis not present

## 2020-06-04 DIAGNOSIS — I2510 Atherosclerotic heart disease of native coronary artery without angina pectoris: Secondary | ICD-10-CM | POA: Diagnosis not present

## 2020-06-04 DIAGNOSIS — M869 Osteomyelitis, unspecified: Secondary | ICD-10-CM | POA: Diagnosis not present

## 2020-06-04 DIAGNOSIS — F5101 Primary insomnia: Secondary | ICD-10-CM | POA: Diagnosis not present

## 2020-06-04 DIAGNOSIS — L97519 Non-pressure chronic ulcer of other part of right foot with unspecified severity: Secondary | ICD-10-CM | POA: Diagnosis not present

## 2020-06-04 DIAGNOSIS — E1140 Type 2 diabetes mellitus with diabetic neuropathy, unspecified: Secondary | ICD-10-CM | POA: Diagnosis not present

## 2020-06-04 DIAGNOSIS — R6889 Other general symptoms and signs: Secondary | ICD-10-CM | POA: Diagnosis not present

## 2020-06-04 DIAGNOSIS — F17200 Nicotine dependence, unspecified, uncomplicated: Secondary | ICD-10-CM | POA: Diagnosis not present

## 2020-06-04 DIAGNOSIS — M79662 Pain in left lower leg: Secondary | ICD-10-CM | POA: Diagnosis not present

## 2020-06-04 DIAGNOSIS — R5381 Other malaise: Secondary | ICD-10-CM | POA: Diagnosis not present

## 2020-06-04 DIAGNOSIS — M6281 Muscle weakness (generalized): Secondary | ICD-10-CM | POA: Diagnosis not present

## 2020-06-04 DIAGNOSIS — R0989 Other specified symptoms and signs involving the circulatory and respiratory systems: Secondary | ICD-10-CM | POA: Diagnosis not present

## 2020-06-04 DIAGNOSIS — S82892D Other fracture of left lower leg, subsequent encounter for closed fracture with routine healing: Secondary | ICD-10-CM | POA: Diagnosis not present

## 2020-06-04 DIAGNOSIS — I5032 Chronic diastolic (congestive) heart failure: Secondary | ICD-10-CM | POA: Diagnosis not present

## 2020-06-04 DIAGNOSIS — S91105D Unspecified open wound of left lesser toe(s) without damage to nail, subsequent encounter: Secondary | ICD-10-CM | POA: Diagnosis not present

## 2020-06-04 DIAGNOSIS — E785 Hyperlipidemia, unspecified: Secondary | ICD-10-CM | POA: Diagnosis not present

## 2020-06-04 DIAGNOSIS — B951 Streptococcus, group B, as the cause of diseases classified elsewhere: Secondary | ICD-10-CM | POA: Diagnosis not present

## 2020-06-04 DIAGNOSIS — J449 Chronic obstructive pulmonary disease, unspecified: Secondary | ICD-10-CM | POA: Diagnosis not present

## 2020-06-04 DIAGNOSIS — U071 COVID-19: Secondary | ICD-10-CM | POA: Diagnosis not present

## 2020-06-04 DIAGNOSIS — L97529 Non-pressure chronic ulcer of other part of left foot with unspecified severity: Secondary | ICD-10-CM | POA: Diagnosis not present

## 2020-06-04 DIAGNOSIS — R5383 Other fatigue: Secondary | ICD-10-CM | POA: Diagnosis not present

## 2020-06-04 DIAGNOSIS — Z7401 Bed confinement status: Secondary | ICD-10-CM | POA: Diagnosis not present

## 2020-06-04 DIAGNOSIS — K219 Gastro-esophageal reflux disease without esophagitis: Secondary | ICD-10-CM | POA: Diagnosis not present

## 2020-06-04 DIAGNOSIS — M2550 Pain in unspecified joint: Secondary | ICD-10-CM | POA: Diagnosis not present

## 2020-06-04 DIAGNOSIS — D649 Anemia, unspecified: Secondary | ICD-10-CM | POA: Diagnosis not present

## 2020-06-04 DIAGNOSIS — E1165 Type 2 diabetes mellitus with hyperglycemia: Secondary | ICD-10-CM | POA: Diagnosis not present

## 2020-06-04 DIAGNOSIS — E11621 Type 2 diabetes mellitus with foot ulcer: Secondary | ICD-10-CM | POA: Diagnosis not present

## 2020-06-04 LAB — GLUCOSE, CAPILLARY
Glucose-Capillary: 96 mg/dL (ref 70–99)
Glucose-Capillary: 206 mg/dL — ABNORMAL HIGH (ref 70–99)
Glucose-Capillary: 116 mg/dL — ABNORMAL HIGH (ref 70–99)
Glucose-Capillary: 198 mg/dL — ABNORMAL HIGH (ref 70–99)

## 2020-06-04 LAB — SARS CORONAVIRUS 2 BY RT PCR (HOSPITAL ORDER, PERFORMED IN ~~LOC~~ HOSPITAL LAB): SARS Coronavirus 2: NEGATIVE

## 2020-06-04 MED ORDER — INSULIN GLARGINE 100 UNIT/ML ~~LOC~~ SOLN
60.0000 [IU] | Freq: Two times a day (BID) | SUBCUTANEOUS | Status: DC
  Administered 2020-06-04 (×2): 60 [IU] via SUBCUTANEOUS
  Filled 2020-06-04 (×3): qty 0.6

## 2020-06-04 MED ORDER — INSULIN GLARGINE 100 UNIT/ML ~~LOC~~ SOLN: 60.0000 [IU] | Freq: Two times a day (BID) | SUBCUTANEOUS | 11 refills | Status: AC

## 2020-06-04 MED ORDER — POLYETHYLENE GLYCOL 3350 17 G PO PACK: 17.0000 g | PACK | Freq: Every day | ORAL | 0 refills | Status: DC | PRN

## 2020-06-04 MED ORDER — DOCUSATE SODIUM 100 MG PO CAPS: 100.0000 mg | ORAL_CAPSULE | Freq: Two times a day (BID) | ORAL | 0 refills | Status: DC

## 2020-06-04 MED ORDER — BISACODYL 10 MG RE SUPP: 10.0000 mg | Freq: Every day | RECTAL | 0 refills | Status: DC | PRN

## 2020-06-04 MED ORDER — HEPARIN SOD (PORK) LOCK FLUSH 100 UNIT/ML IV SOLN
250.0000 [IU] | INTRAVENOUS | Status: AC | PRN
  Administered 2020-06-04: 250 [IU]
  Filled 2020-06-04: qty 2.5

## 2020-06-04 NOTE — Progress Notes (Signed)
Report given to [facility] at [facility]. All questions and concerns were fully answered.

## 2020-06-04 NOTE — Care Management Important Message (Signed)
Important Message  Patient Details  Name: DARE SPILLMAN MRN: [id number] Date of Birth: [birth year]   Medicare Important Message Given:  Yes - Important Message mailed due to current National Emergency  Verbal consent obtained due to current National Emergency  Relationship to patient: Self Contact Name: Hedda Crumbley Call Date: 06/04/20  Time: 1400 Phone: [phone] Outcome: Spoke with contact Important Message mailed to: Patient address on file    Orson Aloe 06/04/2020, 2:00 PM

## 2020-06-04 NOTE — Discharge Summary (Signed)
[facility]  Physician Discharge Summary   Patient ID: [name] MRN: [id number] DOB/AGE: 02/21/1961 59 y.o.  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 Ceftriaxone to continue via PICC line for 4 weeks from 05/26/2020 2. Check CBC and basic metabolic panel in 1 week 3. Monitor CBGs 4 times daily before each meal and at bedtime.  Adjust insulin dose depending on CBGs.    Home Health: None.  Patient going to skilled nursing facility for short-term rehab Equipment/Devices: None  CODE STATUS: Full code  DISCHARGE CONDITION: fair  Diet recommendation: Modified carbohydrate  Wound Care Comments: Wound care to right great toe amputation site: paint with a betadine swabstick once daily. Allow to air dry. No dressing. Comments: Wound care to right foot, 2nd digit full thickness wound: cleanse with soap and water, rinse with NS and pat dry. Cover with size-appropriate piece of folded xeroform gauze [place] # [id number]), top with dry gauze 2x2, secure with a few turns of conform roll gauze/paper tape. Change daily.  INITIAL HISTORY: [name] is a 59 y.o.femalewith history ofmild nonobstructive CAD, COPD not on oxygen, diastolic CHF, IDDM-2 with neuropathy, morbid obesity, depression, tobacco use and recent hospitalization [facility] from 8/12-8/22 for right great toe osteomyelitis, non-STEMI and pulmonary edema presenting to ED due to left ankle pain after fallat home.   Patient was found to have bimalleolar fracture with dislocation of her left ankle.  Orthopedic surgery was consulted who recommended admission to [facility] for surgical intervention.  [facility] was consulted for admission.  Consultants:   Orthopedics, Dr. Jena Gauss  Cardiology  Procedures:   ORIF left ankle fracture/dislocation 05/29/2020, Dr. Harl Bowie COURSE:    Left bimalleolar ankle fracture with dislocation, closed traumatic. Patient presenting following mechanical fall at home with left ankle pain.  X-ray/CT left ankle notable for bimalleolar fracture with dislocation.  Patient underwent ORIF by orthopedics, Dr. Jena Gauss on 05/29/2020. --Nonweightbearing left lower extremity --DVT prophylaxis with Lovenox while inpatient with recommendation of aspirin 325 mg twice daily on discharge --Pain control with oxycodone  --Robaxin 500 mg every 6 hours as needed muscle spasms --PT/OT continues to work with patient.  She will benefit with inpatient therapies at the skilled nursing facility before going home and living independently. Patient remains stable.  Pain is adequately controlled.  Right great toe osteomyelitiss[facility]amputation at [facility] on 05/21/2020. Blood culture and surgical tissue cultures reportedly negative. Wound culture with beta-hemolytic group B streptococcus.Discharged on 05/26/2020 on IV ceftriaxone via PICC line for 4 weeks. --Continue IV ceftriaxone --Wound looks clean and healing.   --Patient has antibiotic plans for 4 weeks, will continue Rocephin on discharge and she will have follow-up with her infectious disease provider from [facility] to determine further duration of antibiotics. Her second toe has also been erythematous.  The nail came off 2 days ago per patient.  Some erythema is noted.  She has good pulses.  Continue antibiotics for now.  Outpatient follow-up with her providers.  History of nonobstructive CAD/recent non-STEMI [facility]  05/17/2020 reported as normal coronaries and mild nonobstructive CAD.No anginal symptoms. High-sensitivity  troponin negative.  --Continue metoprolol tartrate 50 mg p.o. twice daily  Chronic diastolic CHF:  [facility] to 65% and no regional wall motion abnormalities. On p.o. Lasix.  --Volume status appears compensated --Furosemide 40 mg p.o. daily  COPD: No formal PFT on file.Smoked cigarettes since age of 59. Continue home ICS/LABA.  Uncontrolled IDDM-2 with hyperglycemia and neuropathy:  Recent A1c 8.5%.Reports using Lantus 80 units twice daily with sliding scale insulin. Currently on Lantus 60 units daily.  On SSI as well.  Monitor CBGs at skilled nursing facility.  Encourage oral intake.  Essential hypertension:  Blood pressure reasonably well controlled.  Normocytic anemia:  Hemoglobin stable.  No evidence of overt blood loss.  Anxiety/depression:  Continue her home medications.  HLD Continue atorvastatin 10 mg p.o. daily  Morbid obesity Estimated body mass index is 52.24 kg/m as calculated from the following:   Height as of this encounter: 5\' 6"  (1.676 m).   Weight as of this encounter: 146.8 kg.  Overall stable.  Okay for discharge to skilled nursing facility today.   PERTINENT LABS:  The results of significant diagnostics from this hospitalization (including imaging, microbiology, ancillary and laboratory) are listed below for reference.    Microbiology: Recent Results (from the past 240 hour(s))  SARS Coronavirus 2 by RT PCR (hospital order, [facility]) Nasopharyngeal Nasopharyngeal Swab     Status: None   Collection Time: 05/28/20  9:57 AM   Specimen: Nasopharyngeal Swab  Result Value Ref Range Status   SARS Coronavirus 2 NEGATIVE NEGATIVE Final    Comment: (NOTE) SARS-CoV-2 target nucleic acids are NOT DETECTED.  The SARS-CoV-2 RNA is generally detectable in upper and lower respiratory  specimens during the acute phase of infection. The lowest concentration of SARS-CoV-2 viral copies this assay can detect is 250 copies / mL. A negative result does not preclude SARS-CoV-2 infection and should not be used as the sole basis for treatment or other patient management decisions.  A negative result may occur with improper specimen collection / handling, submission of specimen other than nasopharyngeal swab, presence of viral mutation(s) within the areas targeted by this assay, and inadequate number of viral copies (<250 copies / mL). A negative result must be combined with clinical observations, patient history, and epidemiological information.  Fact Sheet for Patients:   BoilerBrush.com.cyhttps://www.fda.gov/media/136312/download  Fact Sheet for Healthcare Providers: https://pope.com/https://www.fda.gov/media/136313/download  This test is not yet approved or  cleared by the [place] FDA and has been authorized for detection and/or diagnosis of SARS-CoV-2 by FDA under an Emergency Use Authorization (EUA).  This EUA will remain in effect (meaning this test can be used) for the duration of the COVID-19 declaration under Section 564(b)(1) of the Act, 21 U.S.C. section 360bbb-3(b)(1), unless the authorization is terminated or revoked sooner.  Performed at [facility], [address] [place], [place] [address]   Surgical pcr screen     Status: None   Collection Time: 05/28/20  8:38 PM   Specimen: Nasal Mucosa; Nasal Swab  Result Value Ref Range Status   MRSA, PCR NEGATIVE NEGATIVE Final   Staphylococcus aureus NEGATIVE NEGATIVE Final    Comment: (NOTE) The Xpert SA Assay (FDA approved for NASAL specimens in patients 58 years of age and older), is one component of a comprehensive surveillance program. It is not intended to diagnose infection nor to guide or monitor treatment. Performed at [facility], [address] [place], [place] [address]  Labs:    Basic  Metabolic Panel: Recent Labs  Lab 05/30/20 0147 05/31/20 0305 06/03/20 0247  NA 135 140 138  K 4.1 3.7 3.9  CL 96* 102 101  CO2 27 29 25   GLUCOSE 394* 131* 163*  BUN 16 11 14   CREATININE 1.15* 0.92 1.07*  CALCIUM 8.7* 8.9 9.1   CBC: Recent Labs  Lab 05/30/20 0147 05/31/20 0305 06/03/20 0247  WBC 9.2 10.3 9.6  HGB 9.8* 9.5* 10.0*  HCT 32.5* 31.8* 33.3*  MCV 80.0 81.5 81.0  PLT 336 377 398    CBG: Recent Labs  Lab 06/03/20 1142 06/03/20 1607 06/03/20 2154 06/04/20 0648 06/04/20 1137  GLUCAP 123* 70 199* 96 206*     IMAGING STUDIES DG Chest 2 View  Result Date: 05/28/2020 CLINICAL DATA:  59 year old female status post fall last night with central chest pain and back pain. EXAM: CHEST - 2 VIEW COMPARISON:  Chest radiographs 05/23/2020 and earlier. FINDINGS: Portable AP semi upright view at 0923 hours. A right PICC line is now in place, tip at the level of the SVC just above the carina. Improved lung volumes. Allowing for portable technique the lungs are clear. Mediastinal contours are within normal limits. Visualized tracheal air column is within normal limits. No pneumothorax or pleural effusion. No acute osseous abnormality identified.  Paucity of bowel gas. IMPRESSION: No acute cardiopulmonary abnormality or acute traumatic injury identified. Electronically Signed   By: 59 M.D.   On: 05/28/2020 09:50   DG Thoracic Spine 2 View  Result Date: 05/28/2020 CLINICAL DATA:  59 year old female status post fall last night with central chest pain and back pain. EXAM: THORACIC SPINE 2 VIEWS COMPARISON:  Portable chest radiograph today.  CTA chest 05/16/2020. FINDINGS: Normal thoracic segmentation. Stable thoracic vertebral height and alignment from the CTA earlier this month. Chronic L1 inferior endplate deformity and sclerosis appears stable. Relatively preserved thoracic disc spaces. Multilevel mild mid and lower thoracic endplate spurring. Grossly intact visible posterior  ribs. No acute osseous abnormality identified. Right PICC line redemonstrated. IMPRESSION: 1. No acute osseous abnormality identified in the thoracic spine. 2. Chronic L1 inferior endplate deformity appears stable. Electronically Signed   By: 59 M.D.   On: 05/28/2020 09:53   DG Lumbar Spine Complete  Result Date: 05/28/2020 CLINICAL DATA:  59 year old female status post fall last night with central chest pain and back pain. EXAM: LUMBAR SPINE - COMPLETE 4+ VIEW COMPARISON:  Thoracic spine radiographs today. Lumbar radiographs 06/28/2018. [facility] Chest CTA 05/16/2020. FINDINGS: Normal lumbar segmentation. Chronic L1 inferior endplate deformity with associated endplate sclerosis and adjacent vacuum disc appears stable from the CTA earlier this month. Stable lumbar vertebral height and alignment elsewhere. No pars fracture. No acute osseous abnormality identified. Visible sacrum and SI joints appear intact. Negative abdominal visceral contours. IMPRESSION: 1. No acute osseous abnormality identified in the lumbar spine. 2. Chronic L1 inferior endplate deformity with L1-L2 vacuum disc. Electronically Signed   By: [facility] M.D.   On: 05/28/2020 09:56   DG Ankle 2 Views Left  Result Date: 05/27/2020 CLINICAL DATA:  Left ankle deformity following fall EXAM: LEFT ANKLE - 2 VIEW COMPARISON:  None. FINDINGS: Two view radiograph left ankle demonstrates a a bimalleolar fracture dislocation of the left ankle. An oblique fracture seen of the distal left fibular metaphysis extending to the level of the tibial plafond and with 1 shaft with posterior displacement, roughly 1 cm override, and roughly  30 degrees posterior angulation of the distal fracture fragment. There is a fracture of the posterior malleolus of the distal tibia with roughly 8 mm override and 5 mm posterior displacement of the fracture fragment. There is posterior and lateral dislocation of the talar dome in  relation to the in tibial plafond with resultant widening of the medial joint space in keeping with injury to the deltoid ligament. Extensive surrounding soft tissue swelling. Small plantar calcaneal spur. Moderate midfoot degenerative arthritis noted. IMPRESSION: 1. Bimalleolar fracture dislocation of the left ankle as described above. Posterolateral tibiotalar dislocation. Electronically Signed   By: Helyn Numbers MD   On: 05/27/2020 22:34   DG Ankle Complete Left  Result Date: 05/29/2020 CLINICAL DATA:  Post ORIF of the LEFT ankle. EXAM: LEFT ANKLE COMPLETE - 3+ VIEW COMPARISON:  Preoperative radiographs from August twenty-fourth 2020 FINDINGS: Casting material overlying the ankle limits bony detail. Lateral cortical plate and screw fixation of the lateral malleolus is noted with syndesmotic screws passing through the cortical plating into the tibia. No new abnormality.  Improved anatomic alignment Signs of midfoot degenerative changes and prior injury outlined in prior CT report. IMPRESSION: Status post ORIF of a left ankle fracture without immediate complication. Radiated graph limited by overlying casting material. Electronically Signed   By: Donzetta Kohut M.D.   On: 05/29/2020 15:29   DG Ankle Complete Left  Result Date: 05/29/2020 CLINICAL DATA:  Open reduction and internal fixation for fracture-dislocation EXAM: DG C-ARM 1-60 MIN; LEFT ANKLE COMPLETE - 3+ VIEW FLUOROSCOPY TIME:  Fluoroscopy Time:  0 minutes 43 seconds Radiation Exposure Index (if provided by the fluoroscopic device): 1.7 mGy Number of Acquired Spot Images: 4 acquired images COMPARISON:  Left ankle radiographs and left ankle CT May 28, 2020 FINDINGS: Initial frontal and lateral views demonstrate gross ankle mortise disruption with the ankle mortise lateral and posterior to the tibial plafond. There is a spiral fracture of the distal fibular diaphysis. Fracture along the posterior distal tibia is also present with mild  displacement of fracture fragments. Subsequent images in frontal and lateral projection show screw and plate fixation through the fracture of the distal fibula with screws transfixing the distal tibiofibular syndesmosis. There is reduction of ankle mortise disruption. Note fracture of the posterior aspect of the distal tibia. There is an inferior calcaneal spur. IMPRESSION: Postoperative fixation with screws and plate transfixing the distal fibular fracture. Other screws traverse the distal tibio- fibular syndesmosis. There is no longer ankle mortise disruption after fixation. Fracture of the distal posterior tibia again noted. There is an inferior calcaneal spur. Electronically Signed   By: Bretta Bang III M.D.   On: 05/29/2020 14:31   DG Ankle Complete Left  Result Date: 05/28/2020 CLINICAL DATA:  Post reduction EXAM: LEFT ANKLE COMPLETE - 3+ VIEW COMPARISON:  05/27/2020 FINDINGS: Near complete interval reduction of a compound trimalleolar fracture dislocation of the left ankle mortise seen on prior examination. There is near anatomic apposition of the ankle mortise and fracture fragments, with mild, persistent displacement of the posterior malleolus. Midfoot arthrosis is again noted. Diffuse soft tissue edema about the foot and ankle. Cast material applied. IMPRESSION: Near complete interval reduction of a compound trimalleolar fracture dislocation of the left ankle mortise seen on prior examination. There is near anatomic apposition of the ankle mortise and fracture fragments, with mild, persistent displacement of the posterior malleolus. Electronically Signed   By: Lauralyn Primes M.D.   On: 05/28/2020 11:09   CT Ankle Left Wo  Contrast  Result Date: 05/28/2020 CLINICAL DATA:  Evaluate left ankle fracture EXAM: CT OF THE LEFT ANKLE WITHOUT CONTRAST TECHNIQUE: Multidetector CT imaging of the left ankle was performed according to the standard protocol. Multiplanar CT image reconstructions were also  generated. COMPARISON:  X-ray 05/28/2020, 12/11/2015 FINDINGS: Bones/Joint/Cartilage Acute obliquely oriented fracture of the distal left fibular metaphysis with minimal posterior displacement (series 8, image 40). Fracture line extends into the distal tibiofibular joint without definite extension into the ankle mortise. Tibiotalar joint alignment is normal without widening. There is a small vertically oriented fracture of the posterior malleolus with triangular fracture fragment measuring 2.6 x 0.6 x 1.4 cm (series 8, image 29; series 3, image 41). There is 2 mm of proximal displacement resulting in slight articular surface depression at the posterior tibial plafond. Medial malleolus is intact without fracture. Tibiotalar joint is anatomically aligned without dislocation. Tiny 2 mm subchondral cyst at the medial talar shoulder suggesting a small osteochondral lesion (series 7, image 52). Subtalar joints are intact without widening or dislocation. Calcaneus intact. Advanced arthropathy within the midfoot with prominent subchondral cystic changes across the naviculocuneiform articulations as well as the second and third TMT joints. There are multiple osseous densities along the dorsal aspect of the midfoot, some of which appear corticated (series 8, images 19-28). The largest fragment emanating from the dorsal aspect of the medial cuneiform measures 1.6 x 0.4 cm and could represent a acute or subacute osseous avulsion (series 8, image 19). Ligaments Suboptimally assessed by CT. There is mild widening between the first and second TMT joints (series 3, image 72) raising the suspicion for an underlying age indeterminate Lisfranc ligament injury. Muscles and Tendons Extensive fatty atrophy of the lower leg and foot musculature compatible with chronic denervation changes. No acute tendinous abnormality by CT. Soft tissues Soft tissue edema without organized hematoma or fluid collection. IMPRESSION: 1. Acute mildly  displaced bimalleolar left ankle fractures involving the distal left fibular metaphysis and posterior malleolus. Anatomic alignment of the ankle mortise. 2. Mild widening between the first and second TMT joints raising the suspicion for an underlying age-indeterminate Lisfranc ligament injury. 3. Advanced arthropathy within the midfoot with prominent subchondral cystic changes across the naviculocuneiform articulations as well as the second and third TMT joints. Multiple osseous densities along the dorsal aspect of the midfoot, some of which appear corticated. The largest fragment emanating from the dorsal aspect of the medial cuneiform measures 1.6 x 0.4 cm and could represent an acute or subacute fracture versus fragmentation related to degenerative or neuropathic/Charcot joint. 4. Tiny 2 mm subchondral cyst at the medial talar shoulder suggesting a small osteochondral lesion. 5. Extensive fatty atrophy of the lower leg and foot musculature compatible with chronic denervation changes. Electronically Signed   By: Duanne Guess D.O.   On: 05/28/2020 12:25   DG C-Arm 1-60 Min  Result Date: 05/29/2020 CLINICAL DATA:  Open reduction and internal fixation for fracture-dislocation EXAM: DG C-ARM 1-60 MIN; LEFT ANKLE COMPLETE - 3+ VIEW FLUOROSCOPY TIME:  Fluoroscopy Time:  0 minutes 43 seconds Radiation Exposure Index (if provided by the fluoroscopic device): 1.7 mGy Number of Acquired Spot Images: 4 acquired images COMPARISON:  Left ankle radiographs and left ankle CT May 28, 2020 FINDINGS: Initial frontal and lateral views demonstrate gross ankle mortise disruption with the ankle mortise lateral and posterior to the tibial plafond. There is a spiral fracture of the distal fibular diaphysis. Fracture along the posterior distal tibia is also present with mild displacement of  fracture fragments. Subsequent images in frontal and lateral projection show screw and plate fixation through the fracture of the distal  fibula with screws transfixing the distal tibiofibular syndesmosis. There is reduction of ankle mortise disruption. Note fracture of the posterior aspect of the distal tibia. There is an inferior calcaneal spur. IMPRESSION: Postoperative fixation with screws and plate transfixing the distal fibular fracture. Other screws traverse the distal tibio- fibular syndesmosis. There is no longer ankle mortise disruption after fixation. Fracture of the distal posterior tibia again noted. There is an inferior calcaneal spur. Electronically Signed   By: Bretta Bang III M.D.   On: 05/29/2020 14:31    DISCHARGE EXAMINATION: Vitals:   06/04/20 0344 06/04/20 0716 06/04/20 0906 06/04/20 1013  BP: (!) 145/52 (!) 147/56    Pulse: 70 76    Resp: 18 17    Temp: 98.5 F (36.9 C) (!) 97.4 F (36.3 C)    TempSrc: Oral Oral    SpO2: 95% 96% 96%   Weight:    (!) 146.8 kg  Height:       General appearance: Awake alert.  In no distress Resp: Clear to auscultation bilaterally.  Normal effort Cardio: S1-S2 is normal regular.  No S3-S4.  No rubs murmurs or bruit GI: Abdomen is soft.  Nontender nondistended.  Bowel sounds are present normal.  No masses organomegaly    DISPOSITION: SNF  Discharge Instructions    Call MD for:  difficulty breathing, headache or visual disturbances   Complete by: As directed    Call MD for:  extreme fatigue   Complete by: As directed    Call MD for:  persistant dizziness or light-headedness   Complete by: As directed    Call MD for:  persistant nausea and vomiting   Complete by: As directed    Call MD for:  severe uncontrolled pain   Complete by: As directed    Call MD for:  temperature >100.4   Complete by: As directed    Diet Carb Modified   Complete by: As directed    Discharge instructions   Complete by: As directed    Please review instructions on the discharge summary  You were cared for by a hospitalist during your hospital stay. If you have any questions about  your discharge medications or the care you received while you were in the hospital after you are discharged, you can call the unit and asked to speak with the hospitalist on call if the hospitalist that took care of you is not available. Once you are discharged, your primary care physician will handle any further medical issues. Please note that NO REFILLS for any discharge medications will be authorized once you are discharged, as it is imperative that you return to your primary care physician (or establish a relationship with a primary care physician if you do not have one) for your aftercare needs so that they can reassess your need for medications and monitor your lab values. If you do not have a primary care physician, you can call [phone] for a physician referral.   Discharge wound care:   Complete by: As directed    Comments: Wound care to right great toe amputation site: paint with a betadine swabstick once daily. Allow to air dry. No dressing. Comments: Wound care to right foot, 2nd digit full thickness wound: cleanse with soap and water, rinse with NS and pat dry. Cover with size-appropriate piece of folded xeroform gauze [place] # 294), top with dry  gauze 2x2, secure with a few turns of conform roll gauze/paper tape. Change daily.   Increase activity slowly   Complete by: As directed         Allergies as of 06/04/2020      Reactions   Ceftriaxone Diarrhea    vomiting and diarrhea      Medication List    STOP taking these medications   amoxicillin-clavulanate 875-125 MG tablet Commonly known as: AUGMENTIN   doxycycline 100 MG tablet Commonly known as: VIBRA-TABS   ibuprofen 200 MG tablet Commonly known as: ADVIL   insulin aspart 100 UNIT/ML injection Commonly known as: NovoLOG FlexPen     TAKE these medications   acetaminophen 500 MG tablet Commonly known as: TYLENOL Take 1,000-1,050 mg by mouth daily as needed for moderate pain.   albuterol 108 (90 Base) MCG/ACT  inhaler Commonly known as: VENTOLIN HFA Inhale 2 puffs into the lungs every 6 (six) hours as needed.   amLODipine 10 MG tablet Commonly known as: NORVASC Take 10 mg by mouth daily.   aspirin EC 325 MG tablet Take 1 tablet (325 mg total) by mouth in the morning and at bedtime. What changed:   medication strength  how much to take  when to take this   atorvastatin 10 MG tablet Commonly known as: LIPITOR Take 10 mg by mouth daily.   bisacodyl 10 MG suppository Commonly known as: DULCOLAX Place 1 suppository (10 mg total) rectally daily as needed for moderate constipation.   buPROPion 200 MG 12 hr tablet Commonly known as: WELLBUTRIN SR Take 200 mg by mouth 2 (two) times daily.   cefTRIAXone 2 g Solr injection Commonly known as: ROCEPHIN Inject 2 g into the vein daily.   collagenase ointment Commonly known as: SANTYL Apply to wound 3 times/week with dry dressing   docusate sodium 100 MG capsule Commonly known as: COLACE Take 1 capsule (100 mg total) by mouth 2 (two) times daily.   DULoxetine 60 MG capsule Commonly known as: CYMBALTA Take 60 mg by mouth daily.   DULoxetine 30 MG capsule Commonly known as: CYMBALTA Take 30 mg by mouth daily.   feeding supplement (PRO-STAT SUGAR FREE 64) Liqd Take 30 mLs by mouth 2 (two) times daily.   ferrous sulfate 325 (65 FE) MG tablet Take 325 mg by mouth daily with breakfast.   fluticasone 50 MCG/ACT nasal spray Commonly known as: FLONASE Place 2 sprays into both nostrils daily.   furosemide 40 MG tablet Commonly known as: LASIX Take 40 mg by mouth daily.   HumaLOG KwikPen 100 UNIT/ML KwikPen Generic drug: insulin lispro Inject 0-25 Units as directed 3 (three) times daily as needed. Per sliding scale   hydrALAZINE 25 MG tablet Commonly known as: APRESOLINE Take 1 tablet by mouth in the morning, at noon, and at bedtime.   insulin glargine 100 UNIT/ML injection Commonly known as: LANTUS Inject 0.6 mLs (60 Units  total) into the skin 2 (two) times daily. What changed: how much to take   Jardiance 25 MG Tabs tablet Generic drug: empagliflozin Take 25 mg by mouth daily.   Klor-Con 20 MEQ packet Generic drug: potassium chloride Take 20 mEq by mouth daily.   Latuda 20 MG Tabs tablet Generic drug: lurasidone Take 20 mg by mouth at bedtime.   lidocaine 5 % Commonly known as: LIDODERM Place 1 patch onto the skin daily.   Lyrica 225 MG capsule Generic drug: pregabalin Take 675 mg by mouth at bedtime.   meloxicam 7.5 MG  tablet Commonly known as: MOBIC Take 7.5 mg by mouth at bedtime.   methocarbamol 500 MG tablet Commonly known as: ROBAXIN Take 1 tablet (500 mg total) by mouth every 6 (six) hours as needed for muscle spasms.   metoprolol tartrate 50 MG tablet Commonly known as: LOPRESSOR Take 50 mg by mouth 2 (two) times daily.   MULTI-DAY VITAMINS PO Take 1 tablet by mouth daily.   nystatin 100000 UNIT/ML suspension Commonly known as: MYCOSTATIN Take 10 mLs by mouth 4 (four) times daily. Pt. States they do not measure their dose and hat they "just takes a swig"   omeprazole 40 MG capsule Commonly known as: PRILOSEC Take 40 mg by mouth daily.   oxyCODONE-acetaminophen 7.5-325 MG tablet Commonly known as: PERCOCET Take 1 tablet by mouth every 6 (six) hours as needed for severe pain. What changed:   when to take this  reasons to take this   Ozempic (1 MG/DOSE) 2 MG/1.5ML Sopn Generic drug: Semaglutide (1 MG/DOSE) Inject 1 mg into the skin every 7 (seven) days.   polyethylene glycol 17 g packet Commonly known as: MIRALAX / GLYCOLAX Take 17 g by mouth daily as needed for mild constipation.   QUEtiapine 100 MG tablet Commonly known as: SEROQUEL Take 100 mg by mouth at bedtime.   Symbicort 80-4.5 MCG/ACT inhaler Generic drug: budesonide-formoterol Inhale 2 puffs into the lungs 2 (two) times daily.   traZODone 100 MG tablet Commonly known as: DESYREL Take 1 tablet by  mouth at bedtime.   Vitamin D (Ergocalciferol) 1.25 MG (50000 UNIT) Caps capsule Commonly known as: DRISDOL Take 50,000 Units by mouth once a week.            Discharge Care Instructions  (From admission, onward)         Start     Ordered   06/04/20 0000  Discharge wound care:       Comments: Comments: Wound care to right great toe amputation site: paint with a betadine swabstick once daily. Allow to air dry. No dressing. Comments: Wound care to right foot, 2nd digit full thickness wound: cleanse with soap and water, rinse with NS and pat dry. Cover with size-appropriate piece of folded xeroform gauze [employer] [place] # [id number]), top with dry gauze 2x2, secure with a few turns of conform roll gauze/paper tape. Change daily.   06/04/20 1200            Follow-up Information    Wilmer Floor., MD. Schedule an appointment as soon as possible for a visit in 1 week(s).   Specialty: Internal Medicine Contact information: [address] [place] [address] [phone]        Roby Lofts, MD. Schedule an appointment as soon as possible for a visit in 10 day(s).   Specialty: Orthopedic Surgery Contact information: [address] [place] [address] [phone]               TOTAL DISCHARGE TIME: 35 minutes  Mickey Esguerra Rito Ehrlich  [facility] Pager on [email or web address]  06/04/2020, 12:00 PM

## 2020-06-04 NOTE — Progress Notes (Signed)
Occupational Therapy Treatment Patient Details Name: [name] MRN: [id number] DOB: 1961/05/11 Today's Date: 06/04/2020    History of present illness Pt is a 59 year old woman admitted after fall resulting in L ankle fx. Underwent ORIF. Pt with recent admission to 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 PMH: HTN, COPD, DM, peripheral neuropathy, CAD, STEMI, anxiety, depression, morbid obesity.   OT comments  Pt. Was able to perform EOB ADLs for UE and requested to lie down for LE ADLs. Pt. Was able to transfer to bsc with cues for proper hand placement. Pt. Needs assist and would benefit from st snf for rehab. Acute ot to follow.   Follow Up Recommendations  SNF;Supervision/Assistance - 24 hour    Equipment Recommendations       Recommendations for Other Services      Precautions / Restrictions Precautions Precautions: Fall Required Braces or Orthoses: Other Brace (cast on l le) Other Brace: RIGHT post op shoe Restrictions Weight Bearing Restrictions: Yes LLE Weight Bearing: Non weight bearing Other Position/Activity Restrictions: R WBAT in post op shoe       Mobility Bed Mobility Overal bed mobility: Modified Independent                Transfers Overall transfer level: Needs assistance Equipment used: Rolling walker (2 wheeled) Transfers: Sit to/from Stand Sit to Stand: From elevated surface   Squat pivot transfers: Mod assist     General transfer comment: Pt. needs cues for proper hand placement    Balance     Sitting balance-Leahy Scale: Good       Standing balance-Leahy Scale: Poor                             ADL either performed or assessed with clinical judgement   ADL Overall ADL's : Needs assistance/impaired Eating/Feeding: Independent   Grooming: Wash/dry hands;Set up;Sitting   Upper Body Bathing: Set up;Sitting   Lower Body Bathing: Maximal assistance;Bed level   Upper Body Dressing : Minimal  assistance;Sitting   Lower Body Dressing: Total assistance;Bed level   Toilet Transfer: Moderate assistance;Stand-pivot   Toileting- Clothing Manipulation and Hygiene: Set up;Sitting/lateral lean       Functional mobility during ADLs: Moderate assistance;Rolling walker General ADL Comments: Pt.needs assist with LE ADLs. Pt. sat eob for UE adl and requested to lie cown for le adls.      Vision       Perception     Praxis      Cognition Arousal/Alertness: Awake/alert Behavior During Therapy: WFL for tasks assessed/performed Overall Cognitive Status: Within Functional Limits for tasks assessed                                          Exercises     Shoulder Instructions       General Comments Pt. has antifungal powder for under stomach folds.     Pertinent Vitals/ Pain       Pain Assessment: 0-10 Pain Score: 7  Pain Location: L LE Pain Descriptors / Indicators: Shooting Pain Intervention(s): Limited activity within patient's tolerance;Patient requesting pain meds-RN notified  Home Living  Prior Functioning/Environment              Frequency  Min 2X/week        Progress Toward Goals  OT Goals(current goals can now be found in the care plan section)  Progress towards OT goals: Progressing toward goals  Acute Rehab OT Goals Patient Stated Goal: take care of myself OT Goal Formulation: With patient Time For Goal Achievement: 06/13/20 Potential to Achieve Goals: Good ADL Goals Pt Will Perform Lower Body Bathing: with min assist;with adaptive equipment;sitting/lateral leans Pt Will Perform Lower Body Dressing: with min assist;with adaptive equipment;sitting/lateral leans Pt Will Transfer to Toilet: with mod assist;stand pivot transfer;bedside commode Pt Will Perform Toileting - Clothing Manipulation and hygiene: sitting/lateral leans  Plan      Co-evaluation                  AM-PAC OT "6 Clicks" Daily Activity     Outcome Measure   Help from another person eating meals?: None Help from another person taking care of personal grooming?: A Little Help from another person toileting, which includes using toliet, bedpan, or urinal?: A Lot Help from another person bathing (including washing, rinsing, drying)?: A Lot Help from another person to put on and taking off regular upper body clothing?: A Little Help from another person to put on and taking off regular lower body clothing?: Total 6 Click Score: 15    End of Session Equipment Utilized During Treatment: Rolling walker  OT Visit Diagnosis: Unsteadiness on feet (R26.81);Other abnormalities of gait and mobility (R26.89);Pain;Muscle weakness (generalized) (M62.81);History of falling (Z91.81)   Activity Tolerance Patient tolerated treatment well   Patient Left in bed;with call bell/phone within reach   Nurse Communication Patient requests pain meds        Time: 0830-0908 OT Time Calculation (min): 38 min  Charges: OT General Charges $OT Visit: 1 Visit OT Treatments $Self Care/Home Management : 38-52 mins  Derrek Gu OT/L   Davell Beckstead 06/04/2020, 10:18 AM

## 2020-06-04 NOTE — TOC Transition Note (Addendum)
Transition of Care [facility]) - CM/SW Discharge Note   Patient Details  Name: LAVAYA DEFREITAS MRN: [id number] Date of Birth: 01/02/1961  Transition of Care [facility]) CM/SW Contact:  Epifanio Lesches, RN Phone Number: 06/04/2020, 1:16 PM   Clinical Narrative:     Fransico Him ref.# [id number], auth.start date 8/31-9/2, Artis Dagout CM.  PASSR # [id number]  Patient will DC to: [facility]. Anticipated DC date:06/04/2020 Family notified: pt stated she will notify daughters Transport by: Sharin Mons   Per MD patient ready for DC today.RN, patient, patient's family, and facility notified of DC. Discharge Summary and FL2 sent to facility. RN to call report prior to discharge [phone]).  Rm# [id number] -B. DC packet on chart. Ambulance transport requested for patient.   RNCM will sign off for now as intervention is no longer needed. Please consult [place] again if new needs arise.    Final next level of care: Skilled Nursing Facility [facility]) Barriers to Discharge: No Barriers Identified   Patient Goals and CMS Choice Patient states their goals for this hospitalization and ongoing recovery are:: Get better soon. CMS Medicare.gov Compare Post Acute Care list provided to:: Patient Choice offered to / list presented to : Patient  Discharge Placement                       Discharge Plan and Services In-house Referral: Clinical Social Work                                   Social Determinants of Health (SDOH) Interventions     Readmission Risk Interventions No flowsheet data found.

## 2020-06-04 NOTE — Plan of Care (Signed)
  Problem: Education: Goal: Knowledge of General Education information will improve Description: Including pain rating scale, medication(s)/side effects and non-pharmacologic comfort measures Outcome: Progressing   Problem: Health Behavior/Discharge Planning: Goal: Ability to manage health-related needs will improve Outcome: Progressing   Problem: Clinical Measurements: Goal: Will remain free from infection Outcome: Progressing   Problem: Activity: Goal: Risk for activity intolerance will decrease Outcome: Progressing   Problem: Nutrition: Goal: Adequate nutrition will be maintained Outcome: Progressing   Problem: Pain Managment: Goal: General experience of comfort will improve Outcome: Progressing   

## 2020-06-04 NOTE — Progress Notes (Signed)
Physical Therapy Treatment Patient Details Name: [name] MRN: [id number] DOB: 08-11-1961 Today's Date: 06/04/2020    History of Present Illness Pt is a 59 year old woman admitted after fall resulting in L ankle fx. Underwent ORIF. 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 PMH: HTN, COPD, DM, peripheral neuropathy, CAD, STEMI, anxiety, depression, morbid obesity.    PT Comments    Pt required max cues for encouragement as she was initially refusing PT session.  Pt required min assistance from elevated surface to rise into standing.  She performed a 22 sec standing trial, followed by pivot to and from commode to toilet.  Continue to recommend snf for rehab to improve strength and function before returning home.     Follow Up Recommendations  SNF     Equipment Recommendations  Wheelchair cushion (measurements PT);Wheelchair (measurements PT);3in1 (PT)    Recommendations for Other Services       Precautions / Restrictions Precautions Precautions: Fall Required Braces or Orthoses: Other Brace Other Brace: RIGHT post op shoe, cast on LLE Restrictions Weight Bearing Restrictions: Yes LLE Weight Bearing: Non weight bearing Other Position/Activity Restrictions: R WBAT in post op shoe    Mobility  Bed Mobility Overal bed mobility: Modified Independent                Transfers Overall transfer level: Needs assistance Equipment used: Rolling walker (2 wheeled) Transfers: Sit to/from [employer] Sit to Stand: From elevated surface;Min assist Stand pivot transfers: Min assist Squat pivot transfers: Mod assist     General transfer comment: Pt. needs cues for proper hand placement and forward weight shifting.  Performed pivot to and from commode with R knee buckling but able to correct with UEs.  Ambulation/Gait                 Stairs             Wheelchair Mobility    Modified Rankin  (Stroke Patients Only)       Balance Overall balance assessment: Needs assistance   Sitting balance-Leahy Scale: Good       Standing balance-Leahy Scale: Poor Standing balance comment: Heavy reliance on B UEs and external assistance.                            Cognition Arousal/Alertness: Awake/alert Behavior During Therapy: WFL for tasks assessed/performed Overall Cognitive Status: Within Functional Limits for tasks assessed                                        Exercises      General Comments General comments (skin integrity, edema, etc.): Pt. has antifungal powder for under stomach folds.       Pertinent Vitals/Pain Pain Assessment: 0-10 Pain Score: 4  Pain Location: L LE Pain Descriptors / Indicators: Shooting Pain Intervention(s): Monitored during session;Repositioned    Home Living                      Prior Function            PT Goals (current goals can now be found in the care plan section) Acute Rehab PT Goals Patient Stated Goal: take care of myself Potential to Achieve Goals: Good Progress towards PT goals: Progressing toward goals  Frequency    Min 3X/week      PT Plan Current plan remains appropriate    Co-evaluation              AM-PAC PT "6 Clicks" Mobility   Outcome Measure  Help needed turning from your back to your side while in a flat bed without using bedrails?: None Help needed moving from lying on your back to sitting on the side of a flat bed without using bedrails?: None Help needed moving to and from a bed to a chair (including a wheelchair)?: A Little Help needed standing up from a chair using your arms (e.g., wheelchair or bedside chair)?: A Little Help needed to walk in hospital room?: Total Help needed climbing 3-5 steps with a railing? : Total 6 Click Score: 16    End of Session Equipment Utilized During Treatment: Gait belt Activity Tolerance: Patient tolerated  treatment well Patient left: in chair;with call bell/phone within reach;with chair alarm set Nurse Communication: Mobility status;Need for lift equipment PT Visit Diagnosis: Pain;Other abnormalities of gait and mobility (R26.89);History of falling (Z91.81) Pain - Right/Left: Left Pain - part of body: Ankle and joints of foot     Time: 1194-1740 PT Time Calculation (min) (ACUTE ONLY): 12 min  Charges:  $Therapeutic Activity: 8-22 mins                     [name] , PTA Acute Rehabilitation Services Pager [phone] Office [phone]     [name] Artis Delay 06/04/2020, 11:46 AM

## 2020-06-05 DIAGNOSIS — M79662 Pain in left lower leg: Secondary | ICD-10-CM | POA: Diagnosis not present

## 2020-06-05 DIAGNOSIS — S82892D Other fracture of left lower leg, subsequent encounter for closed fracture with routine healing: Secondary | ICD-10-CM | POA: Diagnosis not present

## 2020-06-05 DIAGNOSIS — R5381 Other malaise: Secondary | ICD-10-CM | POA: Diagnosis not present

## 2020-06-05 DIAGNOSIS — E1140 Type 2 diabetes mellitus with diabetic neuropathy, unspecified: Secondary | ICD-10-CM | POA: Diagnosis not present

## 2020-06-05 NOTE — Progress Notes (Signed)
Patient left with PTAR around 2300, al night meds including PRN pain meds were given prior to her departure, pt has no questions or concerns.

## 2020-06-06 DIAGNOSIS — L97519 Non-pressure chronic ulcer of other part of right foot with unspecified severity: Secondary | ICD-10-CM | POA: Diagnosis not present

## 2020-06-06 DIAGNOSIS — S82892D Other fracture of left lower leg, subsequent encounter for closed fracture with routine healing: Secondary | ICD-10-CM | POA: Diagnosis not present

## 2020-06-06 DIAGNOSIS — J449 Chronic obstructive pulmonary disease, unspecified: Secondary | ICD-10-CM | POA: Diagnosis not present

## 2020-06-06 DIAGNOSIS — E1142 Type 2 diabetes mellitus with diabetic polyneuropathy: Secondary | ICD-10-CM | POA: Diagnosis not present

## 2020-06-06 DIAGNOSIS — M869 Osteomyelitis, unspecified: Secondary | ICD-10-CM | POA: Diagnosis not present

## 2020-06-07 DIAGNOSIS — E1165 Type 2 diabetes mellitus with hyperglycemia: Secondary | ICD-10-CM | POA: Diagnosis not present

## 2020-06-07 DIAGNOSIS — E1140 Type 2 diabetes mellitus with diabetic neuropathy, unspecified: Secondary | ICD-10-CM | POA: Diagnosis not present

## 2020-06-07 DIAGNOSIS — S72142A Displaced intertrochanteric fracture of left femur, initial encounter for closed fracture: Secondary | ICD-10-CM | POA: Diagnosis not present

## 2020-06-07 DIAGNOSIS — M79662 Pain in left lower leg: Secondary | ICD-10-CM | POA: Diagnosis not present

## 2020-06-12 DIAGNOSIS — M79662 Pain in left lower leg: Secondary | ICD-10-CM | POA: Diagnosis not present

## 2020-06-12 DIAGNOSIS — S72142D Displaced intertrochanteric fracture of left femur, subsequent encounter for closed fracture with routine healing: Secondary | ICD-10-CM | POA: Diagnosis not present

## 2020-06-12 DIAGNOSIS — G894 Chronic pain syndrome: Secondary | ICD-10-CM | POA: Diagnosis not present

## 2020-06-13 DIAGNOSIS — G894 Chronic pain syndrome: Secondary | ICD-10-CM | POA: Diagnosis not present

## 2020-06-13 DIAGNOSIS — S72142D Displaced intertrochanteric fracture of left femur, subsequent encounter for closed fracture with routine healing: Secondary | ICD-10-CM | POA: Diagnosis not present

## 2020-06-13 DIAGNOSIS — L97519 Non-pressure chronic ulcer of other part of right foot with unspecified severity: Secondary | ICD-10-CM | POA: Diagnosis not present

## 2020-06-14 DIAGNOSIS — F5101 Primary insomnia: Secondary | ICD-10-CM | POA: Diagnosis not present

## 2020-06-17 DIAGNOSIS — E1165 Type 2 diabetes mellitus with hyperglycemia: Secondary | ICD-10-CM | POA: Diagnosis not present

## 2020-06-17 DIAGNOSIS — S72142D Displaced intertrochanteric fracture of left femur, subsequent encounter for closed fracture with routine healing: Secondary | ICD-10-CM | POA: Diagnosis not present

## 2020-06-17 DIAGNOSIS — G894 Chronic pain syndrome: Secondary | ICD-10-CM | POA: Diagnosis not present

## 2020-06-18 DIAGNOSIS — S72142D Displaced intertrochanteric fracture of left femur, subsequent encounter for closed fracture with routine healing: Secondary | ICD-10-CM | POA: Diagnosis not present

## 2020-06-18 DIAGNOSIS — E1165 Type 2 diabetes mellitus with hyperglycemia: Secondary | ICD-10-CM | POA: Diagnosis not present

## 2020-06-18 DIAGNOSIS — G894 Chronic pain syndrome: Secondary | ICD-10-CM | POA: Diagnosis not present

## 2020-06-20 DIAGNOSIS — L97519 Non-pressure chronic ulcer of other part of right foot with unspecified severity: Secondary | ICD-10-CM | POA: Diagnosis not present

## 2020-06-22 DIAGNOSIS — J449 Chronic obstructive pulmonary disease, unspecified: Secondary | ICD-10-CM | POA: Diagnosis not present

## 2020-06-24 DIAGNOSIS — R5383 Other fatigue: Secondary | ICD-10-CM | POA: Diagnosis not present

## 2020-06-24 DIAGNOSIS — I10 Essential (primary) hypertension: Secondary | ICD-10-CM | POA: Diagnosis not present

## 2020-06-24 DIAGNOSIS — I2510 Atherosclerotic heart disease of native coronary artery without angina pectoris: Secondary | ICD-10-CM | POA: Diagnosis not present

## 2020-06-24 DIAGNOSIS — E1165 Type 2 diabetes mellitus with hyperglycemia: Secondary | ICD-10-CM | POA: Diagnosis not present

## 2020-06-24 DIAGNOSIS — U071 COVID-19: Secondary | ICD-10-CM | POA: Diagnosis not present

## 2020-06-26 DIAGNOSIS — R5381 Other malaise: Secondary | ICD-10-CM | POA: Diagnosis not present

## 2020-06-26 DIAGNOSIS — R0989 Other specified symptoms and signs involving the circulatory and respiratory systems: Secondary | ICD-10-CM | POA: Diagnosis not present

## 2020-06-26 DIAGNOSIS — U071 COVID-19: Secondary | ICD-10-CM | POA: Diagnosis not present

## 2020-06-26 DIAGNOSIS — G894 Chronic pain syndrome: Secondary | ICD-10-CM | POA: Diagnosis not present

## 2020-06-26 DIAGNOSIS — R5383 Other fatigue: Secondary | ICD-10-CM | POA: Diagnosis not present

## 2020-06-27 DIAGNOSIS — J449 Chronic obstructive pulmonary disease, unspecified: Secondary | ICD-10-CM | POA: Diagnosis not present

## 2020-06-27 DIAGNOSIS — S91105A Unspecified open wound of left lesser toe(s) without damage to nail, initial encounter: Secondary | ICD-10-CM | POA: Diagnosis not present

## 2020-06-27 DIAGNOSIS — M869 Osteomyelitis, unspecified: Secondary | ICD-10-CM | POA: Diagnosis not present

## 2020-06-27 DIAGNOSIS — U071 COVID-19: Secondary | ICD-10-CM | POA: Diagnosis not present

## 2020-06-27 DIAGNOSIS — L97529 Non-pressure chronic ulcer of other part of left foot with unspecified severity: Secondary | ICD-10-CM | POA: Diagnosis not present

## 2020-06-27 DIAGNOSIS — R5381 Other malaise: Secondary | ICD-10-CM | POA: Diagnosis not present

## 2020-06-27 DIAGNOSIS — G894 Chronic pain syndrome: Secondary | ICD-10-CM | POA: Diagnosis not present

## 2020-06-27 DIAGNOSIS — E11621 Type 2 diabetes mellitus with foot ulcer: Secondary | ICD-10-CM | POA: Diagnosis not present

## 2020-06-27 DIAGNOSIS — R0989 Other specified symptoms and signs involving the circulatory and respiratory systems: Secondary | ICD-10-CM | POA: Diagnosis not present

## 2020-07-02 DIAGNOSIS — G894 Chronic pain syndrome: Secondary | ICD-10-CM | POA: Diagnosis not present

## 2020-07-02 DIAGNOSIS — R0989 Other specified symptoms and signs involving the circulatory and respiratory systems: Secondary | ICD-10-CM | POA: Diagnosis not present

## 2020-07-02 DIAGNOSIS — U071 COVID-19: Secondary | ICD-10-CM | POA: Diagnosis not present

## 2020-07-02 DIAGNOSIS — R5381 Other malaise: Secondary | ICD-10-CM | POA: Diagnosis not present

## 2020-07-03 DIAGNOSIS — G894 Chronic pain syndrome: Secondary | ICD-10-CM | POA: Diagnosis not present

## 2020-07-03 DIAGNOSIS — S72142D Displaced intertrochanteric fracture of left femur, subsequent encounter for closed fracture with routine healing: Secondary | ICD-10-CM | POA: Diagnosis not present

## 2020-07-04 DIAGNOSIS — S91105D Unspecified open wound of left lesser toe(s) without damage to nail, subsequent encounter: Secondary | ICD-10-CM | POA: Diagnosis not present

## 2020-07-04 DIAGNOSIS — M869 Osteomyelitis, unspecified: Secondary | ICD-10-CM | POA: Diagnosis not present

## 2020-07-04 DIAGNOSIS — G894 Chronic pain syndrome: Secondary | ICD-10-CM | POA: Diagnosis not present

## 2020-07-04 DIAGNOSIS — L97519 Non-pressure chronic ulcer of other part of right foot with unspecified severity: Secondary | ICD-10-CM | POA: Diagnosis not present

## 2020-07-04 DIAGNOSIS — S72142D Displaced intertrochanteric fracture of left femur, subsequent encounter for closed fracture with routine healing: Secondary | ICD-10-CM | POA: Diagnosis not present

## 2020-07-04 DIAGNOSIS — E1140 Type 2 diabetes mellitus with diabetic neuropathy, unspecified: Secondary | ICD-10-CM | POA: Diagnosis not present

## 2020-07-09 DIAGNOSIS — R0989 Other specified symptoms and signs involving the circulatory and respiratory systems: Secondary | ICD-10-CM | POA: Diagnosis not present

## 2020-07-09 DIAGNOSIS — R059 Cough, unspecified: Secondary | ICD-10-CM | POA: Diagnosis not present

## 2020-07-09 DIAGNOSIS — R5383 Other fatigue: Secondary | ICD-10-CM | POA: Diagnosis not present

## 2020-07-09 DIAGNOSIS — R5381 Other malaise: Secondary | ICD-10-CM | POA: Diagnosis not present

## 2020-07-11 DIAGNOSIS — L97519 Non-pressure chronic ulcer of other part of right foot with unspecified severity: Secondary | ICD-10-CM | POA: Diagnosis not present

## 2020-07-12 DIAGNOSIS — G894 Chronic pain syndrome: Secondary | ICD-10-CM | POA: Diagnosis not present

## 2020-07-12 DIAGNOSIS — I10 Essential (primary) hypertension: Secondary | ICD-10-CM | POA: Diagnosis not present

## 2020-07-12 DIAGNOSIS — M6281 Muscle weakness (generalized): Secondary | ICD-10-CM | POA: Diagnosis not present

## 2020-07-12 DIAGNOSIS — J449 Chronic obstructive pulmonary disease, unspecified: Secondary | ICD-10-CM | POA: Diagnosis not present

## 2020-07-12 DIAGNOSIS — E11621 Type 2 diabetes mellitus with foot ulcer: Secondary | ICD-10-CM | POA: Diagnosis not present

## 2020-07-12 DIAGNOSIS — E1140 Type 2 diabetes mellitus with diabetic neuropathy, unspecified: Secondary | ICD-10-CM | POA: Diagnosis not present

## 2020-07-16 DIAGNOSIS — S93432D Sprain of tibiofibular ligament of left ankle, subsequent encounter: Secondary | ICD-10-CM | POA: Diagnosis not present

## 2020-07-16 DIAGNOSIS — M170 Bilateral primary osteoarthritis of knee: Secondary | ICD-10-CM | POA: Diagnosis not present

## 2020-07-16 DIAGNOSIS — G894 Chronic pain syndrome: Secondary | ICD-10-CM | POA: Diagnosis not present

## 2020-07-16 DIAGNOSIS — M25561 Pain in right knee: Secondary | ICD-10-CM | POA: Diagnosis not present

## 2020-07-16 DIAGNOSIS — S82842D Displaced bimalleolar fracture of left lower leg, subsequent encounter for closed fracture with routine healing: Secondary | ICD-10-CM | POA: Diagnosis not present

## 2020-07-22 DIAGNOSIS — J449 Chronic obstructive pulmonary disease, unspecified: Secondary | ICD-10-CM | POA: Diagnosis not present

## 2020-08-13 DIAGNOSIS — G894 Chronic pain syndrome: Secondary | ICD-10-CM | POA: Diagnosis not present

## 2020-08-13 DIAGNOSIS — S93432D Sprain of tibiofibular ligament of left ankle, subsequent encounter: Secondary | ICD-10-CM | POA: Diagnosis not present

## 2020-08-13 DIAGNOSIS — M170 Bilateral primary osteoarthritis of knee: Secondary | ICD-10-CM | POA: Diagnosis not present

## 2020-08-13 DIAGNOSIS — M25561 Pain in right knee: Secondary | ICD-10-CM | POA: Diagnosis not present

## 2020-08-13 DIAGNOSIS — M47816 Spondylosis without myelopathy or radiculopathy, lumbar region: Secondary | ICD-10-CM | POA: Diagnosis not present

## 2020-08-13 DIAGNOSIS — S82842D Displaced bimalleolar fracture of left lower leg, subsequent encounter for closed fracture with routine healing: Secondary | ICD-10-CM | POA: Diagnosis not present

## 2020-09-10 DIAGNOSIS — M170 Bilateral primary osteoarthritis of knee: Secondary | ICD-10-CM | POA: Diagnosis not present

## 2020-09-10 DIAGNOSIS — Z79891 Long term (current) use of opiate analgesic: Secondary | ICD-10-CM | POA: Diagnosis not present

## 2020-09-10 DIAGNOSIS — G894 Chronic pain syndrome: Secondary | ICD-10-CM | POA: Diagnosis not present

## 2020-09-11 DIAGNOSIS — G894 Chronic pain syndrome: Secondary | ICD-10-CM | POA: Diagnosis not present

## 2020-09-11 DIAGNOSIS — E11621 Type 2 diabetes mellitus with foot ulcer: Secondary | ICD-10-CM | POA: Diagnosis not present

## 2020-09-11 DIAGNOSIS — J449 Chronic obstructive pulmonary disease, unspecified: Secondary | ICD-10-CM | POA: Diagnosis not present

## 2020-09-18 DIAGNOSIS — I10 Essential (primary) hypertension: Secondary | ICD-10-CM | POA: Diagnosis not present

## 2020-09-18 DIAGNOSIS — E559 Vitamin D deficiency, unspecified: Secondary | ICD-10-CM | POA: Diagnosis not present

## 2020-09-18 DIAGNOSIS — E1165 Type 2 diabetes mellitus with hyperglycemia: Secondary | ICD-10-CM | POA: Diagnosis not present

## 2020-09-18 DIAGNOSIS — L03115 Cellulitis of right lower limb: Secondary | ICD-10-CM | POA: Diagnosis not present

## 2020-09-18 DIAGNOSIS — E1140 Type 2 diabetes mellitus with diabetic neuropathy, unspecified: Secondary | ICD-10-CM | POA: Diagnosis not present

## 2020-09-18 DIAGNOSIS — E7800 Pure hypercholesterolemia, unspecified: Secondary | ICD-10-CM | POA: Diagnosis not present

## 2020-09-18 DIAGNOSIS — J449 Chronic obstructive pulmonary disease, unspecified: Secondary | ICD-10-CM | POA: Diagnosis not present

## 2020-09-18 DIAGNOSIS — E612 Magnesium deficiency: Secondary | ICD-10-CM | POA: Diagnosis not present

## 2020-09-21 DIAGNOSIS — J449 Chronic obstructive pulmonary disease, unspecified: Secondary | ICD-10-CM | POA: Diagnosis not present

## 2020-09-26 DIAGNOSIS — E11621 Type 2 diabetes mellitus with foot ulcer: Secondary | ICD-10-CM | POA: Diagnosis not present

## 2020-09-26 DIAGNOSIS — J449 Chronic obstructive pulmonary disease, unspecified: Secondary | ICD-10-CM | POA: Diagnosis not present

## 2020-09-26 DIAGNOSIS — G894 Chronic pain syndrome: Secondary | ICD-10-CM | POA: Diagnosis not present

## 2020-10-10 DIAGNOSIS — Z8781 Personal history of (healed) traumatic fracture: Secondary | ICD-10-CM | POA: Diagnosis not present

## 2020-10-10 DIAGNOSIS — M47816 Spondylosis without myelopathy or radiculopathy, lumbar region: Secondary | ICD-10-CM | POA: Diagnosis not present

## 2020-10-10 DIAGNOSIS — M170 Bilateral primary osteoarthritis of knee: Secondary | ICD-10-CM | POA: Diagnosis not present

## 2020-10-10 DIAGNOSIS — Z79891 Long term (current) use of opiate analgesic: Secondary | ICD-10-CM | POA: Diagnosis not present

## 2020-10-10 DIAGNOSIS — G894 Chronic pain syndrome: Secondary | ICD-10-CM | POA: Diagnosis not present

## 2020-10-10 DIAGNOSIS — E1142 Type 2 diabetes mellitus with diabetic polyneuropathy: Secondary | ICD-10-CM | POA: Diagnosis not present

## 2020-10-10 DIAGNOSIS — M25561 Pain in right knee: Secondary | ICD-10-CM | POA: Diagnosis not present

## 2020-10-12 DIAGNOSIS — G894 Chronic pain syndrome: Secondary | ICD-10-CM | POA: Diagnosis not present

## 2020-10-12 DIAGNOSIS — J449 Chronic obstructive pulmonary disease, unspecified: Secondary | ICD-10-CM | POA: Diagnosis not present

## 2020-10-12 DIAGNOSIS — E11621 Type 2 diabetes mellitus with foot ulcer: Secondary | ICD-10-CM | POA: Diagnosis not present

## 2020-10-22 DIAGNOSIS — J449 Chronic obstructive pulmonary disease, unspecified: Secondary | ICD-10-CM | POA: Diagnosis not present

## 2020-10-23 DIAGNOSIS — M1711 Unilateral primary osteoarthritis, right knee: Secondary | ICD-10-CM | POA: Diagnosis not present

## 2020-10-27 DIAGNOSIS — J449 Chronic obstructive pulmonary disease, unspecified: Secondary | ICD-10-CM | POA: Diagnosis not present

## 2020-10-27 DIAGNOSIS — G894 Chronic pain syndrome: Secondary | ICD-10-CM | POA: Diagnosis not present

## 2020-10-27 DIAGNOSIS — E11621 Type 2 diabetes mellitus with foot ulcer: Secondary | ICD-10-CM | POA: Diagnosis not present

## 2020-10-31 DIAGNOSIS — G894 Chronic pain syndrome: Secondary | ICD-10-CM | POA: Diagnosis not present

## 2020-10-31 DIAGNOSIS — M25561 Pain in right knee: Secondary | ICD-10-CM | POA: Diagnosis not present

## 2020-10-31 DIAGNOSIS — E1142 Type 2 diabetes mellitus with diabetic polyneuropathy: Secondary | ICD-10-CM | POA: Diagnosis not present

## 2020-10-31 DIAGNOSIS — Z8781 Personal history of (healed) traumatic fracture: Secondary | ICD-10-CM | POA: Diagnosis not present

## 2020-10-31 DIAGNOSIS — M170 Bilateral primary osteoarthritis of knee: Secondary | ICD-10-CM | POA: Diagnosis not present

## 2020-10-31 DIAGNOSIS — M47816 Spondylosis without myelopathy or radiculopathy, lumbar region: Secondary | ICD-10-CM | POA: Diagnosis not present

## 2020-11-12 DIAGNOSIS — J449 Chronic obstructive pulmonary disease, unspecified: Secondary | ICD-10-CM | POA: Diagnosis not present

## 2020-11-12 DIAGNOSIS — G894 Chronic pain syndrome: Secondary | ICD-10-CM | POA: Diagnosis not present

## 2020-11-12 DIAGNOSIS — E11621 Type 2 diabetes mellitus with foot ulcer: Secondary | ICD-10-CM | POA: Diagnosis not present

## 2020-11-22 DIAGNOSIS — J449 Chronic obstructive pulmonary disease, unspecified: Secondary | ICD-10-CM | POA: Diagnosis not present

## 2020-11-27 DIAGNOSIS — J449 Chronic obstructive pulmonary disease, unspecified: Secondary | ICD-10-CM | POA: Diagnosis not present

## 2020-11-27 DIAGNOSIS — E11621 Type 2 diabetes mellitus with foot ulcer: Secondary | ICD-10-CM | POA: Diagnosis not present

## 2020-11-27 DIAGNOSIS — G894 Chronic pain syndrome: Secondary | ICD-10-CM | POA: Diagnosis not present

## 2020-11-28 DIAGNOSIS — M25561 Pain in right knee: Secondary | ICD-10-CM | POA: Diagnosis not present

## 2020-11-28 DIAGNOSIS — Z8781 Personal history of (healed) traumatic fracture: Secondary | ICD-10-CM | POA: Diagnosis not present

## 2020-11-28 DIAGNOSIS — G894 Chronic pain syndrome: Secondary | ICD-10-CM | POA: Diagnosis not present

## 2020-11-28 DIAGNOSIS — E1142 Type 2 diabetes mellitus with diabetic polyneuropathy: Secondary | ICD-10-CM | POA: Diagnosis not present

## 2020-11-28 DIAGNOSIS — Z79891 Long term (current) use of opiate analgesic: Secondary | ICD-10-CM | POA: Diagnosis not present

## 2020-11-28 DIAGNOSIS — M170 Bilateral primary osteoarthritis of knee: Secondary | ICD-10-CM | POA: Diagnosis not present

## 2020-11-28 DIAGNOSIS — M47816 Spondylosis without myelopathy or radiculopathy, lumbar region: Secondary | ICD-10-CM | POA: Diagnosis not present

## 2020-12-10 DIAGNOSIS — L89891 Pressure ulcer of other site, stage 1: Secondary | ICD-10-CM | POA: Diagnosis not present

## 2020-12-10 DIAGNOSIS — J449 Chronic obstructive pulmonary disease, unspecified: Secondary | ICD-10-CM | POA: Diagnosis not present

## 2020-12-10 DIAGNOSIS — G894 Chronic pain syndrome: Secondary | ICD-10-CM | POA: Diagnosis not present

## 2020-12-10 DIAGNOSIS — E11621 Type 2 diabetes mellitus with foot ulcer: Secondary | ICD-10-CM | POA: Diagnosis not present

## 2020-12-10 DIAGNOSIS — Z89411 Acquired absence of right great toe: Secondary | ICD-10-CM | POA: Diagnosis not present

## 2020-12-10 DIAGNOSIS — E1142 Type 2 diabetes mellitus with diabetic polyneuropathy: Secondary | ICD-10-CM | POA: Diagnosis not present

## 2020-12-20 DIAGNOSIS — J449 Chronic obstructive pulmonary disease, unspecified: Secondary | ICD-10-CM | POA: Diagnosis not present

## 2020-12-24 DIAGNOSIS — S82842D Displaced bimalleolar fracture of left lower leg, subsequent encounter for closed fracture with routine healing: Secondary | ICD-10-CM | POA: Diagnosis not present

## 2020-12-24 DIAGNOSIS — S93432D Sprain of tibiofibular ligament of left ankle, subsequent encounter: Secondary | ICD-10-CM | POA: Diagnosis not present

## 2020-12-25 ENCOUNTER — Other Ambulatory Visit: Payer: Self-pay | Admitting: Student

## 2020-12-25 DIAGNOSIS — S82842D Displaced bimalleolar fracture of left lower leg, subsequent encounter for closed fracture with routine healing: Secondary | ICD-10-CM

## 2020-12-26 DIAGNOSIS — Z8781 Personal history of (healed) traumatic fracture: Secondary | ICD-10-CM | POA: Diagnosis not present

## 2020-12-26 DIAGNOSIS — E1142 Type 2 diabetes mellitus with diabetic polyneuropathy: Secondary | ICD-10-CM | POA: Diagnosis not present

## 2020-12-26 DIAGNOSIS — M47816 Spondylosis without myelopathy or radiculopathy, lumbar region: Secondary | ICD-10-CM | POA: Diagnosis not present

## 2020-12-26 DIAGNOSIS — M170 Bilateral primary osteoarthritis of knee: Secondary | ICD-10-CM | POA: Diagnosis not present

## 2020-12-26 DIAGNOSIS — G894 Chronic pain syndrome: Secondary | ICD-10-CM | POA: Diagnosis not present

## 2020-12-26 DIAGNOSIS — M25561 Pain in right knee: Secondary | ICD-10-CM | POA: Diagnosis not present

## 2021-01-09 ENCOUNTER — Ambulatory Visit
Admission: RE | Admit: 2021-01-09 | Discharge: 2021-01-09 | Disposition: A | Discharge status: Home / Self Care | Payer: Medicare Other | Source: Ambulatory Visit | Attending: Student | Admitting: Student

## 2021-01-09 DIAGNOSIS — M7989 Other specified soft tissue disorders: Secondary | ICD-10-CM | POA: Diagnosis not present

## 2021-01-09 DIAGNOSIS — S82832A Other fracture of upper and lower end of left fibula, initial encounter for closed fracture: Secondary | ICD-10-CM | POA: Diagnosis not present

## 2021-01-09 DIAGNOSIS — S82842D Displaced bimalleolar fracture of left lower leg, subsequent encounter for closed fracture with routine healing: Secondary | ICD-10-CM

## 2021-01-10 DIAGNOSIS — G894 Chronic pain syndrome: Secondary | ICD-10-CM | POA: Diagnosis not present

## 2021-01-10 DIAGNOSIS — J449 Chronic obstructive pulmonary disease, unspecified: Secondary | ICD-10-CM | POA: Diagnosis not present

## 2021-01-10 DIAGNOSIS — E11621 Type 2 diabetes mellitus with foot ulcer: Secondary | ICD-10-CM | POA: Diagnosis not present

## 2021-01-20 DIAGNOSIS — J449 Chronic obstructive pulmonary disease, unspecified: Secondary | ICD-10-CM | POA: Diagnosis not present

## 2021-01-23 DIAGNOSIS — M170 Bilateral primary osteoarthritis of knee: Secondary | ICD-10-CM | POA: Diagnosis not present

## 2021-01-23 DIAGNOSIS — G894 Chronic pain syndrome: Secondary | ICD-10-CM | POA: Diagnosis not present

## 2021-01-23 DIAGNOSIS — Z8781 Personal history of (healed) traumatic fracture: Secondary | ICD-10-CM | POA: Diagnosis not present

## 2021-01-23 DIAGNOSIS — M47816 Spondylosis without myelopathy or radiculopathy, lumbar region: Secondary | ICD-10-CM | POA: Diagnosis not present

## 2021-01-23 DIAGNOSIS — M25561 Pain in right knee: Secondary | ICD-10-CM | POA: Diagnosis not present

## 2021-01-23 DIAGNOSIS — E1142 Type 2 diabetes mellitus with diabetic polyneuropathy: Secondary | ICD-10-CM | POA: Diagnosis not present

## 2021-01-23 DIAGNOSIS — Z79891 Long term (current) use of opiate analgesic: Secondary | ICD-10-CM | POA: Diagnosis not present

## 2021-02-09 DIAGNOSIS — J449 Chronic obstructive pulmonary disease, unspecified: Secondary | ICD-10-CM | POA: Diagnosis not present

## 2021-02-09 DIAGNOSIS — E11621 Type 2 diabetes mellitus with foot ulcer: Secondary | ICD-10-CM | POA: Diagnosis not present

## 2021-02-09 DIAGNOSIS — G894 Chronic pain syndrome: Secondary | ICD-10-CM | POA: Diagnosis not present

## 2021-02-19 DIAGNOSIS — J449 Chronic obstructive pulmonary disease, unspecified: Secondary | ICD-10-CM | POA: Diagnosis not present

## 2021-02-20 DIAGNOSIS — Z8781 Personal history of (healed) traumatic fracture: Secondary | ICD-10-CM | POA: Diagnosis not present

## 2021-02-20 DIAGNOSIS — M47816 Spondylosis without myelopathy or radiculopathy, lumbar region: Secondary | ICD-10-CM | POA: Diagnosis not present

## 2021-02-20 DIAGNOSIS — M25561 Pain in right knee: Secondary | ICD-10-CM | POA: Diagnosis not present

## 2021-02-20 DIAGNOSIS — G894 Chronic pain syndrome: Secondary | ICD-10-CM | POA: Diagnosis not present

## 2021-02-20 DIAGNOSIS — M170 Bilateral primary osteoarthritis of knee: Secondary | ICD-10-CM | POA: Diagnosis not present

## 2021-02-20 DIAGNOSIS — E1142 Type 2 diabetes mellitus with diabetic polyneuropathy: Secondary | ICD-10-CM | POA: Diagnosis not present

## 2021-03-12 DIAGNOSIS — G894 Chronic pain syndrome: Secondary | ICD-10-CM | POA: Diagnosis not present

## 2021-03-12 DIAGNOSIS — E11621 Type 2 diabetes mellitus with foot ulcer: Secondary | ICD-10-CM | POA: Diagnosis not present

## 2021-03-12 DIAGNOSIS — J449 Chronic obstructive pulmonary disease, unspecified: Secondary | ICD-10-CM | POA: Diagnosis not present

## 2021-03-18 DIAGNOSIS — M25372 Other instability, left ankle: Secondary | ICD-10-CM | POA: Diagnosis not present

## 2021-03-21 DIAGNOSIS — M47816 Spondylosis without myelopathy or radiculopathy, lumbar region: Secondary | ICD-10-CM | POA: Diagnosis not present

## 2021-03-21 DIAGNOSIS — M25561 Pain in right knee: Secondary | ICD-10-CM | POA: Diagnosis not present

## 2021-03-21 DIAGNOSIS — E1142 Type 2 diabetes mellitus with diabetic polyneuropathy: Secondary | ICD-10-CM | POA: Diagnosis not present

## 2021-03-21 DIAGNOSIS — G894 Chronic pain syndrome: Secondary | ICD-10-CM | POA: Diagnosis not present

## 2021-03-21 DIAGNOSIS — Z8781 Personal history of (healed) traumatic fracture: Secondary | ICD-10-CM | POA: Diagnosis not present

## 2021-03-21 DIAGNOSIS — M170 Bilateral primary osteoarthritis of knee: Secondary | ICD-10-CM | POA: Diagnosis not present

## 2021-03-22 DIAGNOSIS — J449 Chronic obstructive pulmonary disease, unspecified: Secondary | ICD-10-CM | POA: Diagnosis not present

## 2021-04-11 DIAGNOSIS — G894 Chronic pain syndrome: Secondary | ICD-10-CM | POA: Diagnosis not present

## 2021-04-11 DIAGNOSIS — J449 Chronic obstructive pulmonary disease, unspecified: Secondary | ICD-10-CM | POA: Diagnosis not present

## 2021-04-11 DIAGNOSIS — E11621 Type 2 diabetes mellitus with foot ulcer: Secondary | ICD-10-CM | POA: Diagnosis not present

## 2021-04-17 DIAGNOSIS — E1142 Type 2 diabetes mellitus with diabetic polyneuropathy: Secondary | ICD-10-CM | POA: Diagnosis not present

## 2021-04-17 DIAGNOSIS — M25561 Pain in right knee: Secondary | ICD-10-CM | POA: Diagnosis not present

## 2021-04-17 DIAGNOSIS — Z8781 Personal history of (healed) traumatic fracture: Secondary | ICD-10-CM | POA: Diagnosis not present

## 2021-04-17 DIAGNOSIS — M170 Bilateral primary osteoarthritis of knee: Secondary | ICD-10-CM | POA: Diagnosis not present

## 2021-04-17 DIAGNOSIS — Z1389 Encounter for screening for other disorder: Secondary | ICD-10-CM | POA: Diagnosis not present

## 2021-04-17 DIAGNOSIS — M47816 Spondylosis without myelopathy or radiculopathy, lumbar region: Secondary | ICD-10-CM | POA: Diagnosis not present

## 2021-04-21 DIAGNOSIS — J449 Chronic obstructive pulmonary disease, unspecified: Secondary | ICD-10-CM | POA: Diagnosis not present

## 2021-05-06 ENCOUNTER — Ambulatory Visit: Payer: Self-pay | Admitting: Student

## 2021-05-06 DIAGNOSIS — S82842A Displaced bimalleolar fracture of left lower leg, initial encounter for closed fracture: Secondary | ICD-10-CM | POA: Insufficient documentation

## 2021-05-06 DIAGNOSIS — S93432D Sprain of tibiofibular ligament of left ankle, subsequent encounter: Secondary | ICD-10-CM | POA: Diagnosis not present

## 2021-05-06 DIAGNOSIS — S82842D Displaced bimalleolar fracture of left lower leg, subsequent encounter for closed fracture with routine healing: Secondary | ICD-10-CM | POA: Diagnosis not present

## 2021-05-12 DIAGNOSIS — J449 Chronic obstructive pulmonary disease, unspecified: Secondary | ICD-10-CM | POA: Diagnosis not present

## 2021-05-12 DIAGNOSIS — E11621 Type 2 diabetes mellitus with foot ulcer: Secondary | ICD-10-CM | POA: Diagnosis not present

## 2021-05-12 DIAGNOSIS — G894 Chronic pain syndrome: Secondary | ICD-10-CM | POA: Diagnosis not present

## 2021-05-16 DIAGNOSIS — Z9181 History of falling: Secondary | ICD-10-CM | POA: Diagnosis not present

## 2021-05-16 DIAGNOSIS — Z0000 Encounter for general adult medical examination without abnormal findings: Secondary | ICD-10-CM | POA: Diagnosis not present

## 2021-05-16 DIAGNOSIS — E785 Hyperlipidemia, unspecified: Secondary | ICD-10-CM | POA: Diagnosis not present

## 2021-05-19 DIAGNOSIS — E1142 Type 2 diabetes mellitus with diabetic polyneuropathy: Secondary | ICD-10-CM | POA: Diagnosis not present

## 2021-05-19 DIAGNOSIS — Z79891 Long term (current) use of opiate analgesic: Secondary | ICD-10-CM | POA: Diagnosis not present

## 2021-05-19 DIAGNOSIS — G894 Chronic pain syndrome: Secondary | ICD-10-CM | POA: Diagnosis not present

## 2021-05-19 DIAGNOSIS — M25561 Pain in right knee: Secondary | ICD-10-CM | POA: Diagnosis not present

## 2021-05-19 DIAGNOSIS — Z8781 Personal history of (healed) traumatic fracture: Secondary | ICD-10-CM | POA: Diagnosis not present

## 2021-05-19 DIAGNOSIS — M47816 Spondylosis without myelopathy or radiculopathy, lumbar region: Secondary | ICD-10-CM | POA: Diagnosis not present

## 2021-05-19 DIAGNOSIS — M170 Bilateral primary osteoarthritis of knee: Secondary | ICD-10-CM | POA: Diagnosis not present

## 2021-05-22 DIAGNOSIS — J449 Chronic obstructive pulmonary disease, unspecified: Secondary | ICD-10-CM | POA: Diagnosis not present

## 2021-05-27 NOTE — Progress Notes (Signed)
Surgical Instructions    Your procedure is scheduled on 06/11/21.  Report to [facility] Main Entrance "A" at 8:10 A.M., then check in with the Admitting office.  Call this number if you have problems the morning of surgery:  [phone]   If you have any questions prior to your surgery date call [phone]: Open Monday-Friday 8am-4pm    Remember:  Do not eat after midnight the night before your surgery  You may drink clear liquids until 7:10 the morning of your surgery.   Clear liquids allowed are: Water, Non-Citrus Juices (without pulp), Carbonated Beverages, Clear Tea, Black Coffee with (NO MILK, CREAM OR POWDERED CREAMER), and Gatorade  Please complete your PRE-SURGERY gatorade that was provided to you by 7:10 the morning of surgery.  Please, if able, drink it in one setting. DO NOT SIP.     Take these medicines the morning of surgery with A SIP OF WATER:  amLODipine (NORVASC) atorvastatin (LIPITOR)  buPROPion (WELLBUTRIN SR) DULoxetine (CYMBALTA) lurasidone (LATUDA) LYRICA  metoprolol tartrate (LOPRESSOR)  omeprazole (PRILOSEC)  oxyCODONE-acetaminophen (PERCOCET) SYMBICORT  acetaminophen (TYLENOL) - if needed  WHAT DO I DO ABOUT MY DIABETES MEDICATION?   Do not take oral diabetes medicines (pills) the morning of surgery.  DO NOT TAKE JARDIANCE THE DAY BEFORE SURGERY OR THE MORNING OF SURGERY  DO NOT TAKE EVENING DOSE OF HUMALOG THE NIGHT BEFORE SURGERY   THE NIGHT BEFORE SURGERY, take ____30_______ units of _Lantus___insulin.  THE MORNING OF SURGERY, take 30 units of Lantus Insulin   THE MORNING OF SURGERY, only take Humalog insuline if blood sugar is greater than 220.  If it is greater than 220, may take 1/2 of usual correction dose    The day of surgery, do not take other diabetes injectables, including Byetta (exenatide), Bydureon (exenatide ER), Victoza (liraglutide), or Trulicity (dulaglutide).   HOW TO MANAGE YOUR DIABETES BEFORE AND AFTER SURGERY  Why  is it important to control my blood sugar before and after surgery? Improving blood sugar levels before and after surgery helps healing and can limit problems. A way of improving blood sugar control is eating a healthy diet by:  Eating less sugar and carbohydrates  Increasing activity/exercise  Talking with your doctor about reaching your blood sugar goals High blood sugars (greater than 180 mg/dL) can raise your risk of infections and slow your recovery, so you will need to focus on controlling your diabetes during the weeks before surgery. Make sure that the doctor who takes care of your diabetes knows about your planned surgery including the date and location.  How do I manage my blood sugar before surgery? Check your blood sugar at least 4 times a day, starting 2 days before surgery, to make sure that the level is not too high or low.  Check your blood sugar the morning of your surgery when you wake up and every 2 hours until you get to the Short Stay unit.  If your blood sugar is less than 70 mg/dL, you will need to treat for low blood sugar: Do not take insulin. Treat a low blood sugar (less than 70 mg/dL) with  cup of clear juice (cranberry or apple), 4 glucose tablets, OR glucose gel. Recheck blood sugar in 15 minutes after treatment (to make sure it is greater than 70 mg/dL). If your blood sugar is not greater than 70 mg/dL on recheck, call [phone] for further instructions. Report your blood sugar to the short stay nurse when you get to Short Stay.  If you are admitted to the hospital after surgery: Your blood sugar will be checked by the staff and you will probably be given insulin after surgery (instead of oral diabetes medicines) to make sure you have good blood sugar levels. The goal for blood sugar control after surgery is 80-180 mg/dL.     As of today, STOP taking any Aspirin (unless otherwise instructed by your surgeon) Meloxicam, Aleve, Naproxen, Ibuprofen, Motrin,  Advil, Goody's, BC's, all herbal medications, fish oil, and all vitamins.          Do not wear jewelry or makeup Do not wear lotions, powders, perfumes or deodorant. Do not shave 48 hours prior to surgery.   Do not bring valuables to the hospital. DO Not wear nail polish, gel polish, artificial nails, or any other type of covering on  natural nails including finger and toenails. If patients have artificial nails, gel coating, etc. that need to be removed by a nail salon please have this removed prior to surgery or surgery may need to be canceled/delayed if the surgeon/ anesthesia feels like the patient is unable to be adequately monitored.             [facility] is not responsible for any belongings or valuables.  Do NOT Smoke (Tobacco/Vaping) or drink Alcohol 24 hours prior to your procedure If you use a CPAP at night, you may bring all equipment for your overnight stay.   Contacts, glasses, dentures or bridgework may not be worn into surgery, please bring cases for these belongings   For patients admitted to the hospital, discharge time will be determined by your treatment team.   Patients discharged the day of surgery will not be allowed to drive home, and someone needs to stay with them for 24 hours.  ONLY 1 SUPPORT PERSON MAY BE PRESENT WHILE YOU ARE IN SURGERY. IF YOU ARE TO BE ADMITTED ONCE YOU ARE IN YOUR ROOM YOU WILL BE ALLOWED TWO (2) VISITORS.  Minor children may have two parents present. Special consideration for safety and communication needs will be reviewed on a case by case basis.  Special instructions:    Oral Hygiene is also important to reduce your risk of infection.  Remember - BRUSH YOUR TEETH THE MORNING OF SURGERY WITH YOUR REGULAR TOOTHPASTE   [facility]- Preparing For Surgery  Before surgery, you can play an important role. Because skin is not sterile, your skin needs to be as free of germs as possible. You can reduce the number of germs on your skin by  washing with CHG (chlorahexidine gluconate) Soap before surgery.  CHG is an antiseptic cleaner which kills germs and bonds with the skin to continue killing germs even after washing.     Please do not use if you have an allergy to CHG or antibacterial soaps. If your skin becomes reddened/irritated stop using the CHG.  Do not shave (including legs and underarms) for at least 48 hours prior to first CHG shower. It is OK to shave your face.  Please follow these instructions carefully.     Shower the NIGHT BEFORE SURGERY and the MORNING OF SURGERY with CHG Soap.   If you chose to wash your hair, wash your hair first as usual with your normal shampoo. After you shampoo, rinse your hair and body thoroughly to remove the shampoo.  Then [employer] and genitals (private parts) with your normal soap and rinse thoroughly to remove soap.  After that Use CHG Soap as you  would any other liquid soap. You can apply CHG directly to the skin and wash gently with a scrungie or a clean washcloth.   Apply the CHG Soap to your body ONLY FROM THE NECK DOWN.  Do not use on open wounds or open sores. Avoid contact with your eyes, ears, mouth and genitals (private parts). Wash Face and genitals (private parts)  with your normal soap.   Wash thoroughly, paying special attention to the area where your surgery will be performed.  Thoroughly rinse your body with warm water from the neck down.  DO NOT shower/wash with your normal soap after using and rinsing off the CHG Soap.  Pat yourself dry with a CLEAN TOWEL.  Wear CLEAN PAJAMAS to bed the night before surgery  Place CLEAN SHEETS on your bed the night before your surgery  DO NOT SLEEP WITH PETS.   Day of Surgery:  Take a shower with CHG soap. Wear Clean/Comfortable clothing the morning of surgery Do not apply any deodorants/lotions.   Remember to brush your teeth WITH YOUR REGULAR TOOTHPASTE.   Please read over the following fact sheets that you were  given.

## 2021-05-28 ENCOUNTER — Encounter (HOSPITAL_COMMUNITY)
Admission: RE | Admit: 2021-05-28 | Discharge: 2021-05-28 | Disposition: A | Discharge status: Home / Self Care | Payer: Medicare Other | Source: Ambulatory Visit | Attending: Student | Admitting: Student

## 2021-05-28 ENCOUNTER — Encounter (HOSPITAL_COMMUNITY): Payer: Self-pay

## 2021-05-28 ENCOUNTER — Other Ambulatory Visit: Payer: Self-pay

## 2021-05-28 DIAGNOSIS — Z01818 Encounter for other preprocedural examination: Secondary | ICD-10-CM | POA: Diagnosis not present

## 2021-05-28 HISTORY — DX: Acute myocardial infarction, unspecified: I21.9

## 2021-05-28 HISTORY — DX: Dyspnea, unspecified: R06.00

## 2021-05-28 HISTORY — DX: Unspecified osteoarthritis, unspecified site: M19.90

## 2021-05-28 HISTORY — DX: Sleep apnea, unspecified: G47.30

## 2021-05-28 HISTORY — DX: Chronic kidney disease, unspecified: N18.9

## 2021-05-28 LAB — BASIC METABOLIC PANEL
Anion gap: 8 (ref 5–15)
BUN: 12 mg/dL (ref 6–20)
CO2: 25 mmol/L (ref 22–32)
Calcium: 8.9 mg/dL (ref 8.9–10.3)
Chloride: 102 mmol/L (ref 98–111)
Creatinine, Ser: 1.04 mg/dL — ABNORMAL HIGH (ref 0.44–1.00)
GFR, Estimated: >60 mL/min (ref >60)
Glucose, Bld: 225 mg/dL — ABNORMAL HIGH (ref 70–99)
Potassium: 4.3 mmol/L (ref 3.5–5.1)
Sodium: 135 mmol/L (ref 135–145)

## 2021-05-28 LAB — SURGICAL PCR SCREEN
MRSA, PCR: NEGATIVE
Staphylococcus aureus: POSITIVE — AB

## 2021-05-28 LAB — GLUCOSE, CAPILLARY: Glucose-Capillary: 275 mg/dL — ABNORMAL HIGH (ref 70–99)

## 2021-05-28 LAB — CBC WITH DIFFERENTIAL/PLATELET
Abs Immature Granulocytes: 0.04 10*3/uL (ref 0.00–0.07)
Basophils Absolute: 0.1 10*3/uL (ref 0.0–0.1)
Basophils Relative: 1 %
Eosinophils Absolute: 0.2 10*3/uL (ref 0.0–0.5)
Eosinophils Relative: 2 %
HCT: 43.4 % (ref 36.0–46.0)
Hemoglobin: 13.9 g/dL (ref 12.0–15.0)
Immature Granulocytes: 0 %
Lymphocytes Relative: 27 %
Lymphs Abs: 2.6 10*3/uL (ref 0.7–4.0)
MCH: 29.3 pg (ref 26.0–34.0)
MCHC: 32 g/dL (ref 30.0–36.0)
MCV: 91.6 fL (ref 80.0–100.0)
Monocytes Absolute: 0.6 10*3/uL (ref 0.1–1.0)
Monocytes Relative: 6 %
Neutro Abs: 6 10*3/uL (ref 1.7–7.7)
Neutrophils Relative %: 64 %
Platelets: 232 10*3/uL (ref 150–400)
RBC: 4.74 MIL/uL (ref 3.87–5.11)
RDW: 14.6 % (ref 11.5–15.5)
WBC: 9.5 10*3/uL (ref 4.0–10.5)
nRBC: 0 % (ref 0.0–0.2)

## 2021-05-28 LAB — HEMOGLOBIN A1C
Hgb A1c MFr Bld: 8.8 % — ABNORMAL HIGH (ref 4.8–5.6)
Mean Plasma Glucose: 205.86 mg/dL

## 2021-05-28 NOTE — Progress Notes (Addendum)
PCP: [name], MD Cardiologist: Denies  EKG: 05/28/21 CXR: na ECHO: 05/17/20 CE requested result Stress Test: denies Cardiac Cath: 05/17/20 CE requested result  Fasting Blood Sugar- 180-220 Checks Blood Sugar__2_ times a day  OSA: Yes CPAP: No  ASA/Blood Thinner: No  Covid test 06/11/21.  Instructions, map and requisition given to patient  Anesthesia Review: Yes, cardiac history. A1C 8.8  Patient denies shortness of breath, fever, cough, and chest pain at PAT appointment.  Patient verbalized understanding of instructions provided today at the PAT appointment.  Patient asked to review instructions at home and day of surgery.

## 2021-05-29 NOTE — Progress Notes (Signed)
Anesthesia Chart Review:  History of mild nonobstructive CAD, COPD not on oxygen, diastolic CHF, IDDM-2 with neuropathy, morbid obesity, depression, tobacco use and recent hospitalization [facility] from 8/12-8/22/2021 for right great toe osteomyelitis, non-STEMI and pulmonary edema. Patient was hospitalized at [facility] for right great toe osteomyelitis.  During hospitalization, she developed chest pain and had non-STEMI.  She underwent left heart catheterization which was reported as normal coronaries with mild CAD.  She also developed acute hypoxic respiratory failure with pulmonary edema.  Echo reported as EF of 60 to 65% with no wall motion abnormalities.  Patient underwent right great toe amputation by podiatry.  Pt was then admitted to [facility] 05/28/21 for bimalleolar ankle fracture. Cardiology was consulted for preop assessment. Seen by Dr. Bjorn Pippin 05/28/20. Per note, "Preop evaluation: Prior to ankle surgery.  She underwent recent work-up for chest pain earlier this month at [facility].  Cardiac catheterization showed minimal CAD.  Echocardiogram showed normal LV systolic function, no significant valvular disease.  As she has had recent negative cardiac work-up, no further cardiac work-up recommended prior to her surgery. Chronic diastolic heart failure: Required IV Lasix during recent admission in [facility].  Difficult to assess volume status on exam, but does not appear grossly volume overloaded.  Can continue oral Lasix."  OSA, not on CPAP.  Preop labs reviewed, IDDM 2 well-controlled with A1c 8.8.  Labs otherwise unremarkable.  Reviewed patient history with Dr. Marcene Duos, okay to proceed as planned barring acute status change.  EKG 05/28/2021: Sinus rhythm.  Rate 86.  Suspect arm lead reversal.  Cardiac cath 05/17/2020 (Care Everywhere): DIAGNOSTIC FINDINGS:    1.  The coronary arteries are essentially normal with minimal detectable   atherosclerosis.  2.  Normal LV systolic function, EF 60 to 65% with no regional wall motion  abnormalities.  3.  Because of a toe ulcer and history of right lower extremity  ulcerations, and recognizing that an aortoiliac Doppler study may be  difficult due to body habitus, and aortoiliac angiogram was performed.    This demonstrated no significant stenosis of the aorta or iliacs  bilaterally.   TTE 05/17/2020 (Care Everywhere): SUMMARY  There is moderate concentric left ventricular hypertrophy.  Left ventricular systolic function is normal.  LV ejection fraction = 60-65%.  There is mild tricuspid regurgitation.  There is no comparison study available.     Zannie Cove [facility]/Anesthesiology Phone [phone] 05/29/2021 11:55 AM

## 2021-05-29 NOTE — Anesthesia Preprocedure Evaluation (Addendum)
Anesthesia Evaluation  Patient identified by MRN, date of birth, ID band Patient awake    Airway Mallampati: II  TM Distance: >3 FB Neck ROM: Full    Dental no notable dental hx.    Pulmonary sleep apnea , COPD, Current Smoker and Patient abstained from smoking.,    Pulmonary exam normal breath sounds clear to auscultation       Cardiovascular hypertension, Normal cardiovascular exam Rhythm:Regular Rate:Normal     Neuro/Psych  Headaches, PSYCHIATRIC DISORDERS Anxiety Depression  Neuromuscular disease    GI/Hepatic PUD, GERD  ,  Endo/Other  diabetes, Poorly Controlled, Type 2, Insulin DependentMorbid obesity (BMI 56)  Renal/GU      Musculoskeletal  (+) Arthritis , Osteoarthritis,    Abdominal   Peds  Hematology  (+) anemia ,   Anesthesia Other Findings   Reproductive/Obstetrics                            Anesthesia Physical Anesthesia Plan  ASA: 4  Anesthesia Plan: General   Post-op Pain Management:    Induction: Intravenous  PONV Risk Score and Plan: 2 and Treatment may vary due to age or medical condition, Midazolam, Ondansetron and Dexamethasone  Airway Management Planned: Oral ETT and Video Laryngoscope Planned  Additional Equipment:   Intra-op Plan:   Post-operative Plan: Extubation in OR  Informed Consent: I have reviewed the patients History and Physical, chart, labs and discussed the procedure including the risks, benefits and alternatives for the proposed anesthesia with the patient or authorized representative who has indicated his/her understanding and acceptance.     Dental advisory given  Plan Discussed with: CRNA  Anesthesia Plan Comments: (GETA. Discussed case with Dr. Jena Gauss. He does not think a peripheral nerve block is needed for this procedure today. Acetaminophen preop. Glidescope available for intubation. Tanna Furry, MD     PAT note by Antionette Poles,  PA-C: History ofmild nonobstructive CAD, COPD not on oxygen, diastolic CHF, IDDM-2 with neuropathy, morbid obesity, depression, tobacco use and recent hospitalization [facility] from 8/12-8/22/2021 for right great toe osteomyelitis, non-STEMI and pulmonary edema. Patient was hospitalized at [facility] hospital for right great toe osteomyelitis. During hospitalization, she developed chest pain and had non-STEMI.She underwent left heart catheterization which was reported as normal coronaries with mild CAD.She also developed acute hypoxic respiratory failure with pulmonary edema.Echo reported as EF of 60 to 65% with no wall motion abnormalities. Patient underwent right great toe amputation by podiatry.  Pt was then admitted to [facility] 05/28/21 for bimalleolar ankle fracture. Cardiology was consulted for preop assessment. Seen by Dr. Bjorn Pippin 05/28/20. Per note, "Preop evaluation:Prior to ankle surgery.She underwent recent work-up for chest pain earlier this month at [facility]. Cardiac catheterization showed minimal CAD. Echocardiogram showed normal LV systolic function, no significant valvular disease. As she has had recent negative cardiac work-up, no further cardiac work-up recommended prior to her surgery. Chronic diastolic heart failure: Required IV Lasix during recent admission[place]. Difficult to assess volume status on exam, but does not appear grossly volume overloaded. Can continue oral Lasix."  OSA, not on CPAP.  Preop labs reviewed, IDDM 2 well-controlled with A1c 8.8.  Labs otherwise unremarkable.  Reviewed patient history with Dr. Marcene Duos, okay to proceed as planned barring acute status change.  EKG 05/28/2021: Sinus rhythm.  Rate 86.  Suspect arm lead reversal.  Cardiac cath 05/17/2020 (Care Everywhere): DIAGNOSTIC FINDINGS:   1. The coronary arteriesare  essentially normal with minimal detectable  atherosclerosis.  2. Normal LV  systolic function, EF 60 to 65% with no regional wall motion  abnormalities.  3. Because of a toe ulcer and history of right lower extremity  ulcerations, and recognizing that an aortoiliac Doppler study may be  difficult due to body habitus, and aortoiliac angiogram was performed.   This demonstrated no significant stenosis of the aorta or iliacs  bilaterally.   TTE 05/17/2020 (Care Everywhere): SUMMARY  There is moderate concentric left ventricular hypertrophy.  Left ventricular systolic function is normal.  LV ejection fraction = 60-65%.  There is mild tricuspid regurgitation.  There is no comparison study available.   )       Anesthesia Quick Evaluation

## 2021-06-04 DIAGNOSIS — S82842A Displaced bimalleolar fracture of left lower leg, initial encounter for closed fracture: Secondary | ICD-10-CM | POA: Insufficient documentation

## 2021-06-11 ENCOUNTER — Inpatient Hospital Stay (HOSPITAL_COMMUNITY)
Admission: RE | Admit: 2021-06-11 | Discharge: 2021-06-25 | DRG: 982 | Disposition: A | Discharge status: Skilled Nursing Facility | Payer: Medicare Other | Source: Ambulatory Visit | Attending: Student | Admitting: Student

## 2021-06-11 ENCOUNTER — Inpatient Hospital Stay (HOSPITAL_COMMUNITY): Payer: Medicare Other

## 2021-06-11 ENCOUNTER — Inpatient Hospital Stay (HOSPITAL_COMMUNITY): Payer: Medicare Other | Admitting: Anesthesiology

## 2021-06-11 ENCOUNTER — Encounter (HOSPITAL_COMMUNITY): Payer: Self-pay | Admitting: Student

## 2021-06-11 ENCOUNTER — Encounter (HOSPITAL_COMMUNITY)
Admission: RE | Disposition: A | Discharge status: Skilled Nursing Facility | Payer: Self-pay | Source: Ambulatory Visit | Attending: Student

## 2021-06-11 ENCOUNTER — Other Ambulatory Visit: Payer: Self-pay

## 2021-06-11 ENCOUNTER — Inpatient Hospital Stay (HOSPITAL_COMMUNITY): Payer: Medicare Other | Admitting: Physician Assistant

## 2021-06-11 DIAGNOSIS — R293 Abnormal posture: Secondary | ICD-10-CM | POA: Diagnosis not present

## 2021-06-11 DIAGNOSIS — E1140 Type 2 diabetes mellitus with diabetic neuropathy, unspecified: Secondary | ICD-10-CM | POA: Diagnosis not present

## 2021-06-11 DIAGNOSIS — K219 Gastro-esophageal reflux disease without esophagitis: Secondary | ICD-10-CM | POA: Diagnosis present

## 2021-06-11 DIAGNOSIS — I129 Hypertensive chronic kidney disease with stage 1 through stage 4 chronic kidney disease, or unspecified chronic kidney disease: Secondary | ICD-10-CM | POA: Diagnosis present

## 2021-06-11 DIAGNOSIS — F419 Anxiety disorder, unspecified: Secondary | ICD-10-CM | POA: Diagnosis present

## 2021-06-11 DIAGNOSIS — T84099A Other mechanical complication of unspecified internal joint prosthesis, initial encounter: Secondary | ICD-10-CM | POA: Diagnosis not present

## 2021-06-11 DIAGNOSIS — Z800 Family history of malignant neoplasm of digestive organs: Secondary | ICD-10-CM | POA: Diagnosis not present

## 2021-06-11 DIAGNOSIS — Z472 Encounter for removal of internal fixation device: Secondary | ICD-10-CM | POA: Diagnosis not present

## 2021-06-11 DIAGNOSIS — R5381 Other malaise: Secondary | ICD-10-CM | POA: Diagnosis not present

## 2021-06-11 DIAGNOSIS — E785 Hyperlipidemia, unspecified: Secondary | ICD-10-CM | POA: Diagnosis not present

## 2021-06-11 DIAGNOSIS — J449 Chronic obstructive pulmonary disease, unspecified: Secondary | ICD-10-CM | POA: Diagnosis present

## 2021-06-11 DIAGNOSIS — Z833 Family history of diabetes mellitus: Secondary | ICD-10-CM

## 2021-06-11 DIAGNOSIS — R519 Headache, unspecified: Secondary | ICD-10-CM | POA: Diagnosis present

## 2021-06-11 DIAGNOSIS — Z8249 Family history of ischemic heart disease and other diseases of the circulatory system: Secondary | ICD-10-CM | POA: Diagnosis not present

## 2021-06-11 DIAGNOSIS — D62 Acute posthemorrhagic anemia: Secondary | ICD-10-CM | POA: Diagnosis not present

## 2021-06-11 DIAGNOSIS — E1122 Type 2 diabetes mellitus with diabetic chronic kidney disease: Secondary | ICD-10-CM | POA: Diagnosis not present

## 2021-06-11 DIAGNOSIS — R531 Weakness: Secondary | ICD-10-CM | POA: Diagnosis not present

## 2021-06-11 DIAGNOSIS — Z20822 Contact with and (suspected) exposure to covid-19: Secondary | ICD-10-CM | POA: Diagnosis not present

## 2021-06-11 DIAGNOSIS — D649 Anemia, unspecified: Secondary | ICD-10-CM | POA: Diagnosis not present

## 2021-06-11 DIAGNOSIS — Z794 Long term (current) use of insulin: Secondary | ICD-10-CM | POA: Diagnosis not present

## 2021-06-11 DIAGNOSIS — E611 Iron deficiency: Secondary | ICD-10-CM | POA: Diagnosis not present

## 2021-06-11 DIAGNOSIS — M19072 Primary osteoarthritis, left ankle and foot: Secondary | ICD-10-CM | POA: Diagnosis not present

## 2021-06-11 DIAGNOSIS — Z8711 Personal history of peptic ulcer disease: Secondary | ICD-10-CM

## 2021-06-11 DIAGNOSIS — S82842A Displaced bimalleolar fracture of left lower leg, initial encounter for closed fracture: Secondary | ICD-10-CM | POA: Diagnosis not present

## 2021-06-11 DIAGNOSIS — S82842D Displaced bimalleolar fracture of left lower leg, subsequent encounter for closed fracture with routine healing: Secondary | ICD-10-CM | POA: Diagnosis not present

## 2021-06-11 DIAGNOSIS — E11610 Type 2 diabetes mellitus with diabetic neuropathic arthropathy: Principal | ICD-10-CM | POA: Diagnosis present

## 2021-06-11 DIAGNOSIS — Z4789 Encounter for other orthopedic aftercare: Secondary | ICD-10-CM | POA: Diagnosis not present

## 2021-06-11 DIAGNOSIS — G4700 Insomnia, unspecified: Secondary | ICD-10-CM | POA: Diagnosis not present

## 2021-06-11 DIAGNOSIS — Z818 Family history of other mental and behavioral disorders: Secondary | ICD-10-CM | POA: Diagnosis not present

## 2021-06-11 DIAGNOSIS — Z8719 Personal history of other diseases of the digestive system: Secondary | ICD-10-CM

## 2021-06-11 DIAGNOSIS — M6281 Muscle weakness (generalized): Secondary | ICD-10-CM | POA: Diagnosis not present

## 2021-06-11 DIAGNOSIS — I252 Old myocardial infarction: Secondary | ICD-10-CM | POA: Diagnosis not present

## 2021-06-11 DIAGNOSIS — Z419 Encounter for procedure for purposes other than remedying health state, unspecified: Secondary | ICD-10-CM

## 2021-06-11 DIAGNOSIS — E119 Type 2 diabetes mellitus without complications: Secondary | ICD-10-CM | POA: Diagnosis not present

## 2021-06-11 DIAGNOSIS — I10 Essential (primary) hypertension: Secondary | ICD-10-CM | POA: Diagnosis not present

## 2021-06-11 DIAGNOSIS — E1165 Type 2 diabetes mellitus with hyperglycemia: Secondary | ICD-10-CM | POA: Diagnosis not present

## 2021-06-11 DIAGNOSIS — F17210 Nicotine dependence, cigarettes, uncomplicated: Secondary | ICD-10-CM | POA: Diagnosis not present

## 2021-06-11 DIAGNOSIS — N1831 Chronic kidney disease, stage 3a: Secondary | ICD-10-CM | POA: Diagnosis present

## 2021-06-11 DIAGNOSIS — T148XXA Other injury of unspecified body region, initial encounter: Secondary | ICD-10-CM

## 2021-06-11 DIAGNOSIS — E11621 Type 2 diabetes mellitus with foot ulcer: Secondary | ICD-10-CM | POA: Diagnosis not present

## 2021-06-11 DIAGNOSIS — G4730 Sleep apnea, unspecified: Secondary | ICD-10-CM | POA: Diagnosis present

## 2021-06-11 DIAGNOSIS — Z981 Arthrodesis status: Secondary | ICD-10-CM | POA: Diagnosis not present

## 2021-06-11 DIAGNOSIS — G894 Chronic pain syndrome: Secondary | ICD-10-CM | POA: Diagnosis not present

## 2021-06-11 DIAGNOSIS — Z6841 Body Mass Index (BMI) 40.0 and over, adult: Secondary | ICD-10-CM

## 2021-06-11 DIAGNOSIS — Z741 Need for assistance with personal care: Secondary | ICD-10-CM | POA: Diagnosis not present

## 2021-06-11 DIAGNOSIS — Z743 Need for continuous supervision: Secondary | ICD-10-CM | POA: Diagnosis not present

## 2021-06-11 HISTORY — PX: ANKLE FUSION: SHX5718

## 2021-06-11 LAB — CBC
HCT: 41 % (ref 36.0–46.0)
Hemoglobin: 13.3 g/dL (ref 12.0–15.0)
MCH: 29.8 pg (ref 26.0–34.0)
MCHC: 32.4 g/dL (ref 30.0–36.0)
MCV: 91.9 fL (ref 80.0–100.0)
Platelets: 226 10*3/uL (ref 150–400)
RBC: 4.46 MIL/uL (ref 3.87–5.11)
RDW: 14.6 % (ref 11.5–15.5)
WBC: 14.2 10*3/uL — ABNORMAL HIGH (ref 4.0–10.5)
nRBC: 0 % (ref 0.0–0.2)

## 2021-06-11 LAB — HEMOGLOBIN A1C
Hgb A1c MFr Bld: 8 % — ABNORMAL HIGH (ref 4.8–5.6)
Mean Plasma Glucose: 182.9 mg/dL

## 2021-06-11 LAB — GLUCOSE, CAPILLARY
Glucose-Capillary: 196 mg/dL — ABNORMAL HIGH (ref 70–99)
Glucose-Capillary: 229 mg/dL — ABNORMAL HIGH (ref 70–99)
Glucose-Capillary: 243 mg/dL — ABNORMAL HIGH (ref 70–99)
Glucose-Capillary: 212 mg/dL — ABNORMAL HIGH (ref 70–99)
Glucose-Capillary: 357 mg/dL — ABNORMAL HIGH (ref 70–99)

## 2021-06-11 LAB — CREATININE, SERUM
Creatinine, Ser: 1.14 mg/dL — ABNORMAL HIGH (ref 0.44–1.00)
GFR, Estimated: 56 mL/min — ABNORMAL LOW (ref >60)

## 2021-06-11 LAB — VITAMIN D 25 HYDROXY (VIT D DEFICIENCY, FRACTURES): Vit D, 25-Hydroxy: 31 ng/mL (ref 30–100)

## 2021-06-11 LAB — SARS CORONAVIRUS 2 BY RT PCR (HOSPITAL ORDER, PERFORMED IN ~~LOC~~ HOSPITAL LAB): SARS Coronavirus 2: NEGATIVE

## 2021-06-11 SURGERY — ANKLE FUSION
Anesthesia: General | Site: Ankle | Laterality: Left

## 2021-06-11 MED ORDER — ACETAMINOPHEN 500 MG PO TABS
ORAL_TABLET | ORAL | Status: AC
  Filled 2021-06-11: qty 2

## 2021-06-11 MED ORDER — ONDANSETRON HCL 4 MG/2ML IJ SOLN: 4.0000 mg | Freq: Four times a day (QID) | INTRAMUSCULAR | Status: DC | PRN

## 2021-06-11 MED ORDER — PANTOPRAZOLE SODIUM 40 MG PO TBEC
80.0000 mg | DELAYED_RELEASE_TABLET | Freq: Every day | ORAL | Status: DC
Start: 2021-06-11 — End: 2021-06-25
  Administered 2021-06-11 – 2021-06-25 (×15): 80 mg via ORAL
  Filled 2021-06-11: qty 4
  Filled 2021-06-11 (×2): qty 2
  Filled 2021-06-11: qty 4
  Filled 2021-06-11 (×6): qty 2
  Filled 2021-06-11: qty 4
  Filled 2021-06-11 (×5): qty 2

## 2021-06-11 MED ORDER — METOCLOPRAMIDE HCL 5 MG PO TABS: 5.0000 mg | ORAL_TABLET | Freq: Three times a day (TID) | ORAL | Status: DC | PRN

## 2021-06-11 MED ORDER — CEFAZOLIN SODIUM-DEXTROSE 2-4 GM/100ML-% IV SOLN
2.0000 g | Freq: Three times a day (TID) | INTRAVENOUS | Status: AC
Start: 2021-06-11 — End: 2021-06-12
  Administered 2021-06-11 – 2021-06-12 (×3): 2 g via INTRAVENOUS
  Filled 2021-06-11 (×3): qty 100

## 2021-06-11 MED ORDER — CHLORHEXIDINE GLUCONATE 0.12 % MT SOLN
OROMUCOSAL | Status: AC
  Filled 2021-06-11: qty 15

## 2021-06-11 MED ORDER — TRAZODONE HCL 50 MG PO TABS
100.0000 mg | ORAL_TABLET | Freq: Every day | ORAL | Status: DC
  Administered 2021-06-11 – 2021-06-24 (×14): 100 mg via ORAL
  Filled 2021-06-11 (×14): qty 2

## 2021-06-11 MED ORDER — PHENYLEPHRINE HCL-NACL 20-0.9 MG/250ML-% IV SOLN
INTRAVENOUS | Status: DC | PRN
  Administered 2021-06-11: 40 ug/min via INTRAVENOUS

## 2021-06-11 MED ORDER — OXYCODONE-ACETAMINOPHEN 5-325 MG PO TABS
1.0000 | ORAL_TABLET | ORAL | Status: DC | PRN
  Administered 2021-06-12 – 2021-06-25 (×28): 1 via ORAL
  Filled 2021-06-11 (×29): qty 1

## 2021-06-11 MED ORDER — FENTANYL CITRATE (PF) 100 MCG/2ML IJ SOLN
INTRAMUSCULAR | Status: AC
  Filled 2021-06-11: qty 2

## 2021-06-11 MED ORDER — EPHEDRINE 5 MG/ML INJ
INTRAVENOUS | Status: AC
  Filled 2021-06-11: qty 5

## 2021-06-11 MED ORDER — DOCUSATE SODIUM 100 MG PO CAPS
100.0000 mg | ORAL_CAPSULE | Freq: Two times a day (BID) | ORAL | Status: DC
  Administered 2021-06-11 – 2021-06-25 (×27): 100 mg via ORAL
  Filled 2021-06-11 (×29): qty 1

## 2021-06-11 MED ORDER — ORAL CARE MOUTH RINSE: 15.0000 mL | Freq: Once | OROMUCOSAL | Status: AC

## 2021-06-11 MED ORDER — DROPERIDOL 2.5 MG/ML IJ SOLN: 0.6250 mg | Freq: Once | INTRAMUSCULAR | Status: DC | PRN

## 2021-06-11 MED ORDER — PREGABALIN 75 MG PO CAPS
225.0000 mg | ORAL_CAPSULE | Freq: Three times a day (TID) | ORAL | Status: DC
  Administered 2021-06-11 – 2021-06-25 (×41): 225 mg via ORAL
  Filled 2021-06-11 (×41): qty 3

## 2021-06-11 MED ORDER — 0.9 % SODIUM CHLORIDE (POUR BTL) OPTIME
TOPICAL | Status: DC | PRN
  Administered 2021-06-11: 1000 mL

## 2021-06-11 MED ORDER — ONDANSETRON HCL 4 MG PO TABS: 4.0000 mg | ORAL_TABLET | Freq: Four times a day (QID) | ORAL | Status: DC | PRN

## 2021-06-11 MED ORDER — TOBRAMYCIN SULFATE 1.2 G IJ SOLR
INTRAMUSCULAR | Status: AC
  Filled 2021-06-11: qty 1.2

## 2021-06-11 MED ORDER — POLYETHYLENE GLYCOL 3350 17 G PO PACK
17.0000 g | PACK | Freq: Every day | ORAL | Status: DC | PRN
  Administered 2021-06-21 – 2021-06-22 (×2): 17 g via ORAL
  Filled 2021-06-11 (×2): qty 1

## 2021-06-11 MED ORDER — CHLORHEXIDINE GLUCONATE 0.12 % MT SOLN
15.0000 mL | Freq: Once | OROMUCOSAL | Status: AC
  Administered 2021-06-11: 15 mL via OROMUCOSAL

## 2021-06-11 MED ORDER — KETOROLAC TROMETHAMINE 15 MG/ML IJ SOLN
15.0000 mg | Freq: Four times a day (QID) | INTRAMUSCULAR | Status: DC
  Administered 2021-06-11 – 2021-06-12 (×3): 15 mg via INTRAVENOUS
  Filled 2021-06-11 (×3): qty 1

## 2021-06-11 MED ORDER — INSULIN ASPART 100 UNIT/ML IJ SOLN
0.0000 [IU] | Freq: Three times a day (TID) | INTRAMUSCULAR | Status: DC
  Administered 2021-06-11 (×2): 5 [IU] via SUBCUTANEOUS
  Administered 2021-06-12: 8 [IU] via SUBCUTANEOUS

## 2021-06-11 MED ORDER — ACETAMINOPHEN 500 MG PO TABS
1000.0000 mg | ORAL_TABLET | Freq: Once | ORAL | Status: AC
  Administered 2021-06-11: 1000 mg via ORAL

## 2021-06-11 MED ORDER — ROCURONIUM BROMIDE 10 MG/ML (PF) SYRINGE
PREFILLED_SYRINGE | INTRAVENOUS | Status: AC
  Filled 2021-06-11: qty 10

## 2021-06-11 MED ORDER — ENOXAPARIN SODIUM 40 MG/0.4ML IJ SOSY
40.0000 mg | PREFILLED_SYRINGE | INTRAMUSCULAR | Status: DC
  Administered 2021-06-12 – 2021-06-13 (×2): 40 mg via SUBCUTANEOUS
  Filled 2021-06-11 (×2): qty 0.4

## 2021-06-11 MED ORDER — ACETAMINOPHEN 500 MG PO TABS
1000.0000 mg | ORAL_TABLET | Freq: Four times a day (QID) | ORAL | Status: DC
  Administered 2021-06-11 – 2021-06-12 (×3): 1000 mg via ORAL
  Filled 2021-06-11 (×3): qty 2

## 2021-06-11 MED ORDER — DULOXETINE HCL 60 MG PO CPEP
90.0000 mg | ORAL_CAPSULE | Freq: Every day | ORAL | Status: DC
  Administered 2021-06-12 – 2021-06-25 (×14): 90 mg via ORAL
  Filled 2021-06-11 (×14): qty 1

## 2021-06-11 MED ORDER — OXYCODONE HCL 5 MG/5ML PO SOLN: 5.0000 mg | Freq: Once | ORAL | Status: DC | PRN

## 2021-06-11 MED ORDER — OXYCODONE HCL 5 MG PO TABS
5.0000 mg | ORAL_TABLET | ORAL | Status: DC | PRN
  Administered 2021-06-11: 5 mg via ORAL
  Administered 2021-06-12: 10 mg via ORAL
  Filled 2021-06-11 (×2): qty 2

## 2021-06-11 MED ORDER — MIDAZOLAM HCL 2 MG/2ML IJ SOLN
INTRAMUSCULAR | Status: AC
  Filled 2021-06-11: qty 2

## 2021-06-11 MED ORDER — VANCOMYCIN HCL 1000 MG IV SOLR
INTRAVENOUS | Status: DC | PRN
  Administered 2021-06-11: 1000 mg via TOPICAL

## 2021-06-11 MED ORDER — QUETIAPINE FUMARATE 100 MG PO TABS
100.0000 mg | ORAL_TABLET | Freq: Every day | ORAL | Status: DC
  Administered 2021-06-11 – 2021-06-24 (×14): 100 mg via ORAL
  Filled 2021-06-11 (×14): qty 1

## 2021-06-11 MED ORDER — LIDOCAINE 2% (20 MG/ML) 5 ML SYRINGE
INTRAMUSCULAR | Status: AC
  Filled 2021-06-11: qty 5

## 2021-06-11 MED ORDER — FERROUS SULFATE 325 (65 FE) MG PO TABS
325.0000 mg | ORAL_TABLET | Freq: Every day | ORAL | Status: DC
  Administered 2021-06-12 – 2021-06-25 (×14): 325 mg via ORAL
  Filled 2021-06-11 (×14): qty 1

## 2021-06-11 MED ORDER — INSULIN ASPART 100 UNIT/ML IJ SOLN
INTRAMUSCULAR | Status: AC
  Filled 2021-06-11: qty 1

## 2021-06-11 MED ORDER — SUGAMMADEX SODIUM 500 MG/5ML IV SOLN
INTRAVENOUS | Status: AC
  Filled 2021-06-11: qty 5

## 2021-06-11 MED ORDER — OXYCODONE HCL 5 MG PO TABS: 5.0000 mg | ORAL_TABLET | Freq: Once | ORAL | Status: DC | PRN

## 2021-06-11 MED ORDER — ONDANSETRON HCL 4 MG/2ML IJ SOLN
INTRAMUSCULAR | Status: AC
  Filled 2021-06-11: qty 2

## 2021-06-11 MED ORDER — ONDANSETRON HCL 4 MG/2ML IJ SOLN
INTRAMUSCULAR | Status: DC | PRN
  Administered 2021-06-11: 4 mg via INTRAVENOUS

## 2021-06-11 MED ORDER — METHOCARBAMOL 500 MG PO TABS
500.0000 mg | ORAL_TABLET | Freq: Four times a day (QID) | ORAL | Status: DC | PRN
  Administered 2021-06-11 – 2021-06-24 (×19): 500 mg via ORAL
  Filled 2021-06-11 (×19): qty 1

## 2021-06-11 MED ORDER — METOCLOPRAMIDE HCL 5 MG/ML IJ SOLN: 5.0000 mg | Freq: Three times a day (TID) | INTRAMUSCULAR | Status: DC | PRN

## 2021-06-11 MED ORDER — PROPOFOL 10 MG/ML IV BOLUS
INTRAVENOUS | Status: DC | PRN
  Administered 2021-06-11: 200 mg via INTRAVENOUS

## 2021-06-11 MED ORDER — MIDAZOLAM HCL 2 MG/2ML IJ SOLN
INTRAMUSCULAR | Status: DC | PRN
  Administered 2021-06-11: 2 mg via INTRAVENOUS

## 2021-06-11 MED ORDER — CEFAZOLIN IN SODIUM CHLORIDE 3-0.9 GM/100ML-% IV SOLN
3.0000 g | INTRAVENOUS | Status: AC
  Administered 2021-06-11: 3 g via INTRAVENOUS
  Filled 2021-06-11: qty 100

## 2021-06-11 MED ORDER — METHOCARBAMOL 1000 MG/10ML IJ SOLN
500.0000 mg | Freq: Four times a day (QID) | INTRAVENOUS | Status: DC | PRN
  Filled 2021-06-11: qty 5

## 2021-06-11 MED ORDER — FENTANYL CITRATE (PF) 100 MCG/2ML IJ SOLN
25.0000 ug | INTRAMUSCULAR | Status: DC | PRN
  Administered 2021-06-11 (×3): 50 ug via INTRAVENOUS

## 2021-06-11 MED ORDER — FENTANYL CITRATE (PF) 250 MCG/5ML IJ SOLN
INTRAMUSCULAR | Status: AC
  Filled 2021-06-11: qty 5

## 2021-06-11 MED ORDER — METOPROLOL TARTRATE 50 MG PO TABS
50.0000 mg | ORAL_TABLET | Freq: Two times a day (BID) | ORAL | Status: DC
  Administered 2021-06-11 – 2021-06-25 (×27): 50 mg via ORAL
  Filled 2021-06-11 (×28): qty 1

## 2021-06-11 MED ORDER — FENTANYL CITRATE (PF) 250 MCG/5ML IJ SOLN
INTRAMUSCULAR | Status: DC | PRN
  Administered 2021-06-11 (×3): 50 ug via INTRAVENOUS

## 2021-06-11 MED ORDER — SUCCINYLCHOLINE CHLORIDE 200 MG/10ML IV SOSY
PREFILLED_SYRINGE | INTRAVENOUS | Status: DC | PRN
Start: 2021-06-11 — End: 2021-06-11
  Administered 2021-06-11: 180 mg via INTRAVENOUS

## 2021-06-11 MED ORDER — POTASSIUM CHLORIDE IN NACL 20-0.9 MEQ/L-% IV SOLN
INTRAVENOUS | Status: DC
  Filled 2021-06-11: qty 1000

## 2021-06-11 MED ORDER — PROMETHAZINE HCL 25 MG/ML IJ SOLN: 6.2500 mg | INTRAMUSCULAR | Status: DC | PRN

## 2021-06-11 MED ORDER — ROCURONIUM BROMIDE 10 MG/ML (PF) SYRINGE
PREFILLED_SYRINGE | INTRAVENOUS | Status: DC | PRN
  Administered 2021-06-11: 50 mg via INTRAVENOUS
  Administered 2021-06-11: 10 mg via INTRAVENOUS
  Administered 2021-06-11: 20 mg via INTRAVENOUS
  Administered 2021-06-11: 10 mg via INTRAVENOUS
  Administered 2021-06-11: 20 mg via INTRAVENOUS
  Administered 2021-06-11: 10 mg via INTRAVENOUS

## 2021-06-11 MED ORDER — EPHEDRINE SULFATE-NACL 50-0.9 MG/10ML-% IV SOSY
PREFILLED_SYRINGE | INTRAVENOUS | Status: DC | PRN
  Administered 2021-06-11 (×3): 5 mg via INTRAVENOUS

## 2021-06-11 MED ORDER — PROPOFOL 10 MG/ML IV BOLUS
INTRAVENOUS | Status: AC
  Filled 2021-06-11: qty 20

## 2021-06-11 MED ORDER — TOBRAMYCIN SULFATE 1.2 G IJ SOLR
INTRAMUSCULAR | Status: DC | PRN
  Administered 2021-06-11: 1.2 g via TOPICAL

## 2021-06-11 MED ORDER — LIDOCAINE 2% (20 MG/ML) 5 ML SYRINGE
INTRAMUSCULAR | Status: DC | PRN
  Administered 2021-06-11: 80 mg via INTRAVENOUS

## 2021-06-11 MED ORDER — SUCCINYLCHOLINE CHLORIDE 200 MG/10ML IV SOSY
PREFILLED_SYRINGE | INTRAVENOUS | Status: AC
  Filled 2021-06-11: qty 10

## 2021-06-11 MED ORDER — VANCOMYCIN HCL 1000 MG IV SOLR
INTRAVENOUS | Status: AC
  Filled 2021-06-11: qty 20

## 2021-06-11 MED ORDER — HYDROMORPHONE HCL 1 MG/ML IJ SOLN
0.5000 mg | INTRAMUSCULAR | Status: DC | PRN
  Administered 2021-06-16 – 2021-06-24 (×11): 1 mg via INTRAVENOUS
  Filled 2021-06-11 (×12): qty 1

## 2021-06-11 MED ORDER — HYDRALAZINE HCL 25 MG PO TABS
25.0000 mg | ORAL_TABLET | Freq: Three times a day (TID) | ORAL | Status: DC
  Administered 2021-06-11 – 2021-06-25 (×39): 25 mg via ORAL
  Filled 2021-06-11 (×41): qty 1

## 2021-06-11 MED ORDER — OXYCODONE HCL 5 MG PO TABS
5.0000 mg | ORAL_TABLET | ORAL | Status: DC | PRN
  Administered 2021-06-12 – 2021-06-25 (×28): 5 mg via ORAL
  Filled 2021-06-11 (×27): qty 1

## 2021-06-11 MED ORDER — FLUTICASONE PROPIONATE 50 MCG/ACT NA SUSP: 2.0000 | Freq: Every day | NASAL | Status: DC | PRN

## 2021-06-11 MED ORDER — INSULIN ASPART 100 UNIT/ML IJ SOLN
0.0000 [IU] | Freq: Every day | INTRAMUSCULAR | Status: DC
  Administered 2021-06-11: 5 [IU] via SUBCUTANEOUS
  Administered 2021-06-15: 3 [IU] via SUBCUTANEOUS
  Administered 2021-06-18: 2 [IU] via SUBCUTANEOUS
  Administered 2021-06-19: 3 [IU] via SUBCUTANEOUS
  Administered 2021-06-21 – 2021-06-24 (×2): 2 [IU] via SUBCUTANEOUS

## 2021-06-11 MED ORDER — BUPROPION HCL ER (SR) 100 MG PO TB12
200.0000 mg | ORAL_TABLET | Freq: Two times a day (BID) | ORAL | Status: DC
  Administered 2021-06-11 – 2021-06-25 (×28): 200 mg via ORAL
  Filled 2021-06-11 (×29): qty 2

## 2021-06-11 MED ORDER — SUGAMMADEX SODIUM 200 MG/2ML IV SOLN
INTRAVENOUS | Status: DC | PRN
  Administered 2021-06-11: 350 mg via INTRAVENOUS

## 2021-06-11 MED ORDER — AMLODIPINE BESYLATE 10 MG PO TABS
10.0000 mg | ORAL_TABLET | Freq: Every day | ORAL | Status: DC
  Administered 2021-06-12 – 2021-06-25 (×13): 10 mg via ORAL
  Filled 2021-06-11 (×14): qty 1

## 2021-06-11 MED ORDER — LACTATED RINGERS IV SOLN: INTRAVENOUS | Status: DC

## 2021-06-11 MED ORDER — DEXAMETHASONE SODIUM PHOSPHATE 10 MG/ML IJ SOLN
INTRAMUSCULAR | Status: DC | PRN
  Administered 2021-06-11: 5 mg via INTRAVENOUS

## 2021-06-11 MED ORDER — ATORVASTATIN CALCIUM 10 MG PO TABS
10.0000 mg | ORAL_TABLET | Freq: Every day | ORAL | Status: DC
  Administered 2021-06-12 – 2021-06-25 (×14): 10 mg via ORAL
  Filled 2021-06-11 (×14): qty 1

## 2021-06-11 MED ORDER — OXYCODONE-ACETAMINOPHEN 10-325 MG PO TABS: 1.0000 | ORAL_TABLET | ORAL | Status: DC | PRN

## 2021-06-11 MED ORDER — MOMETASONE FURO-FORMOTEROL FUM 100-5 MCG/ACT IN AERO
2.0000 | INHALATION_SPRAY | Freq: Two times a day (BID) | RESPIRATORY_TRACT | Status: DC
  Administered 2021-06-12 – 2021-06-25 (×26): 2 via RESPIRATORY_TRACT
  Filled 2021-06-11: qty 8.8

## 2021-06-11 SURGICAL SUPPLY — 72 items
BAG COUNTER SPONGE SURGICOUNT (BAG) ×2 IMPLANT
BANDAGE ESMARK 6X9 LF (GAUZE/BANDAGES/DRESSINGS) ×1 IMPLANT
BIT DRILL CALIBRATED 4.3MMX365 (DRILL) ×1 IMPLANT
BIT DRILL CALIBRATED 4.3X320MM (BIT) ×1 IMPLANT
BIT DRILL CANN 7X200 (BIT) ×2 IMPLANT
BIT DRILL CROWE POINT TWST 4.3 (DRILL) ×1 IMPLANT
BLADE AVERAGE 25X9 (BLADE) ×2 IMPLANT
BLADE SURG 10 STRL SS (BLADE) ×2 IMPLANT
BNDG COHESIVE 4X5 TAN STRL (GAUZE/BANDAGES/DRESSINGS) ×2 IMPLANT
BNDG ELASTIC 4X5.8 VLCR STR LF (GAUZE/BANDAGES/DRESSINGS) ×2 IMPLANT
BNDG ELASTIC 6X10 VLCR STRL LF (GAUZE/BANDAGES/DRESSINGS) ×2 IMPLANT
BNDG ELASTIC 6X5.8 VLCR STR LF (GAUZE/BANDAGES/DRESSINGS) ×2 IMPLANT
BNDG ESMARK 6X9 LF (GAUZE/BANDAGES/DRESSINGS) ×2
BONE CANC CHIPS 20CC PCAN1/4 (Bone Implant) ×2 IMPLANT
BRUSH SCRUB EZ PLAIN DRY (MISCELLANEOUS) ×4 IMPLANT
CHIPS CANC BONE 20CC PCAN1/4 (Bone Implant) ×1 IMPLANT
CHLORAPREP W/TINT 26 (MISCELLANEOUS) ×2 IMPLANT
CONNECTOR 5 IN 1 STRAIGHT STRL (MISCELLANEOUS) ×2 IMPLANT
COVER MAYO STAND STRL (DRAPES) ×2 IMPLANT
COVER SURGICAL LIGHT HANDLE (MISCELLANEOUS) ×4 IMPLANT
DRAPE C-ARM 42X72 X-RAY (DRAPES) ×2 IMPLANT
DRAPE C-ARMOR (DRAPES) ×2 IMPLANT
DRAPE INCISE IOBAN 66X45 STRL (DRAPES) ×2 IMPLANT
DRAPE ORTHO SPLIT 77X108 STRL (DRAPES) ×4
DRAPE SURG ORHT 6 SPLT 77X108 (DRAPES) ×2 IMPLANT
DRAPE U-SHAPE 47X51 STRL (DRAPES) ×2 IMPLANT
DRILL CALIBRATED 4.3MMX365 (DRILL) ×2
DRILL CALIBRATED 4.3X320MM (BIT) ×2
DRILL CROWE POINT TWIST 4.3 (DRILL) ×2
DRSG EMULSION OIL 3X3 NADH (GAUZE/BANDAGES/DRESSINGS) ×2 IMPLANT
DRSG MEPITEL 3X4 ME34 (GAUZE/BANDAGES/DRESSINGS) ×2 IMPLANT
ELECT REM PT RETURN 9FT ADLT (ELECTROSURGICAL) ×2
ELECTRODE REM PT RTRN 9FT ADLT (ELECTROSURGICAL) ×1 IMPLANT
GAUZE SPONGE 4X4 12PLY STRL (GAUZE/BANDAGES/DRESSINGS) ×8 IMPLANT
GLOVE SURG ENC MOIS LTX SZ6.5 (GLOVE) ×6 IMPLANT
GLOVE SURG ENC MOIS LTX SZ7.5 (GLOVE) ×8 IMPLANT
GLOVE SURG UNDER POLY LF SZ6.5 (GLOVE) ×2 IMPLANT
GLOVE SURG UNDER POLY LF SZ7.5 (GLOVE) ×2 IMPLANT
GOWN STRL REUS W/ TWL LRG LVL3 (GOWN DISPOSABLE) ×2 IMPLANT
GOWN STRL REUS W/TWL LRG LVL3 (GOWN DISPOSABLE) ×4
GUIDEPIN VERSANAIL DSP 3.2X444 (ORTHOPEDIC DISPOSABLE SUPPLIES) ×4 IMPLANT
GUIDEWIRE 2.6X80 BEAD TIP (WIRE) ×1 IMPLANT
GUIDWIRE 2.6X80 BEAD TIP (WIRE) ×2
KIT BASIN OR (CUSTOM PROCEDURE TRAY) ×2 IMPLANT
KIT TURNOVER KIT B (KITS) ×2 IMPLANT
MANIFOLD NEPTUNE II (INSTRUMENTS) ×2 IMPLANT
NAIL ANKLE LOCK ANTE 10X180 (Nail) ×2 IMPLANT
NEEDLE HYPO 21X1.5 SAFETY (NEEDLE) ×2 IMPLANT
NS IRRIG 1000ML POUR BTL (IV SOLUTION) ×2 IMPLANT
PACK ORTHO EXTREMITY (CUSTOM PROCEDURE TRAY) ×2 IMPLANT
PAD ARMBOARD 7.5X6 YLW CONV (MISCELLANEOUS) ×4 IMPLANT
PAD CAST 4YDX4 CTTN HI CHSV (CAST SUPPLIES) ×3 IMPLANT
PADDING CAST COTTON 4X4 STRL (CAST SUPPLIES) ×6
PADDING CAST COTTON 6X4 STRL (CAST SUPPLIES) ×4 IMPLANT
SCREW CORT TI DBL LEAD 5X36 (Screw) ×4 IMPLANT
SCREW CORT TI DBL LEAD 5X65 (Screw) ×2 IMPLANT
SCREW CORT TI DBLE LEAD 5X28 (Screw) ×4 IMPLANT
SPLINT PLASTER CAST XFAST 5X30 (CAST SUPPLIES) ×1 IMPLANT
SPLINT PLASTER XFAST SET 5X30 (CAST SUPPLIES) ×1
SPONGE T-LAP 18X18 ~~LOC~~+RFID (SPONGE) ×4 IMPLANT
SUCTION FRAZIER HANDLE 10FR (MISCELLANEOUS) ×2
SUCTION TUBE FRAZIER 10FR DISP (MISCELLANEOUS) ×1 IMPLANT
SUT ETHILON 2 0 FS 18 (SUTURE) ×4 IMPLANT
SUT ETHILON 3 0 PS 1 (SUTURE) ×4 IMPLANT
SUT MON AB 2-0 CT1 36 (SUTURE) ×4 IMPLANT
SUT VIC AB 0 CT1 27 (SUTURE) ×2
SUT VIC AB 0 CT1 27XBRD ANBCTR (SUTURE) ×1 IMPLANT
TOWEL GREEN STERILE (TOWEL DISPOSABLE) ×4 IMPLANT
TOWEL GREEN STERILE FF (TOWEL DISPOSABLE) ×2 IMPLANT
TUBE CONNECTING 12X1/4 (SUCTIONS) ×4 IMPLANT
UNDERPAD 30X36 HEAVY ABSORB (UNDERPADS AND DIAPERS) ×2 IMPLANT
WATER STERILE IRR 1000ML POUR (IV SOLUTION) ×2 IMPLANT

## 2021-06-11 NOTE — TOC Progression Note (Signed)
Transition of Care [facility]) - Initial/Assessment Note    Patient Details  Name: [name] MRN: [id number] Date of Birth: [birth year]  Transition of [facility]) CM/SW Contact:    Ralene Bathe, LCSWA Phone Number: 06/11/2021, 5:08 PM  Clinical Narrative:                  TOC following patient for any d/c planning needs once medically stable.  Pending: PT and OT recommendations.   Cleon Gustin, MSW, LCSWA        Patient Goals and CMS Choice        Expected Discharge Plan and Services                                                Prior Living Arrangements/Services                       Activities of Daily Living      Permission Sought/Granted                  Emotional Assessment              Admission diagnosis:  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.072] Patient Active Problem List   Diagnosis Date Noted   Arthritis of left ankle 06/11/2021   Closed bimalleolar fracture of left ankle 05/06/2021   Closed left ankle fracture 05/28/2020   Moderate protein malnutrition (HCC)    Adult BMI 50.0-59.9 kg/sq m (HCC)    Normocytic anemia 04/21/2018   Diabetic foot infection (HCC) 04/21/2018   Mild renal insufficiency 04/21/2018   Diabetic infection of right foot [facility])    Preventative health care 04/14/2011   DYSMETABOLIC SYNDROME 12/08/2010   ULNAR NEUROPATHY, BILATERAL 10/01/2010   Insulin-requiring or dependent type II diabetes mellitus (HCC) 09/11/2010   HYPERLIPIDEMIA 09/11/2010   Morbid (severe) obesity due to excess calories (HCC) 09/11/2010   TOBACCO USE 09/11/2010   Depression with anxiety 09/11/2010   Essential hypertension 09/11/2010   GERD 09/11/2010   PEPTIC ULCER DISEASE 09/11/2010   COLONIC POLYPS, HX OF 09/11/2010   Lumbosacral spondylosis 02/27/2010   PCP:  Wilmer Floor., MD Pharmacy:   [facility] [phone] Rosalita Levan, [place] - [address] AT [facility] [address]  [place] [address] Phone: [phone] Fax: [phone]  [id number] - [place], [place] - [address] [facility] [address] Phone: [phone] Fax: [phone]     Social Determinants of Health (SDOH) Interventions    Readmission Risk Interventions No flowsheet data found.

## 2021-06-11 NOTE — Anesthesia Postprocedure Evaluation (Signed)
Anesthesia Post Note  Patient: [name]  Procedure(s) Performed: ANKLE FUSION (Left: Ankle)     Patient location during evaluation: PACU Anesthesia Type: General Level of consciousness: awake and alert Pain management: pain level controlled Vital Signs Assessment: post-procedure vital signs reviewed and stable Respiratory status: spontaneous breathing, nonlabored ventilation, respiratory function stable and patient connected to nasal cannula oxygen Cardiovascular status: blood pressure returned to baseline and stable Postop Assessment: no apparent nausea or vomiting Anesthetic complications: no   No notable events documented.  Last Vitals:  Vitals:   06/11/21 1450 06/11/21 1520  BP: 132/63 (!) 146/62  Pulse: 84 84  Resp: 13 17  Temp:    SpO2: 96% 95%    Last Pain:  Vitals:   06/11/21 1520  TempSrc:   PainSc: Asleep                 [name]

## 2021-06-11 NOTE — Transfer of Care (Signed)
Immediate Anesthesia Transfer of Care Note  Patient: [name]  Procedure(s) Performed: ANKLE FUSION (Left: Ankle)  Patient Location: PACU  Anesthesia Type:General  Level of Consciousness: drowsy  Airway & Oxygen Therapy: Patient Spontanous Breathing and Patient connected to face mask oxygen  Post-op Assessment: Report given to RN and Post -op Vital signs reviewed and stable  Post vital signs: Reviewed and stable  Last Vitals:  Vitals Value Taken Time  BP 139/60 06/11/21 1321  Temp    Pulse 84 06/11/21 1320  Resp 13 06/11/21 1320  SpO2 94 % 06/11/21 1320  Vitals shown include unvalidated device data.  Last Pain:  Vitals:   06/11/21 0751  TempSrc:   PainSc: 6       Patients Stated Pain Goal: 0 (06/11/21 0751)  Complications: No notable events documented.

## 2021-06-11 NOTE — Anesthesia Procedure Notes (Signed)
Procedure Name: Intubation Date/Time: 06/11/2021 10:15 AM Performed by: Tressia Miners, CRNA Pre-anesthesia Checklist: Patient identified, Emergency Drugs available, Suction available and Patient being monitored Patient Re-evaluated:Patient Re-evaluated prior to induction Oxygen Delivery Method: Circle System Utilized Preoxygenation: Pre-oxygenation with 100% oxygen Induction Type: IV induction Ventilation: Mask ventilation without difficulty Laryngoscope Size: Glidescope and 4 Grade View: Grade I Tube type: Oral Tube size: 7.5 mm Number of attempts: 1 Airway Equipment and Method: Rigid stylet Placement Confirmation: ETT inserted through vocal cords under direct vision, positive ETCO2 and breath sounds checked- equal and bilateral Secured at: 22 cm Tube secured with: Tape Dental Injury: Teeth and Oropharynx as per pre-operative assessment

## 2021-06-11 NOTE — H&P (Signed)
Orthopaedic Trauma Service (OTS) Consult   Patient ID: [name] MRN: [id number] DOB/AGE: 59-Jun-1962 59 y.o.  Reason for Surgery: Left ankle arthritis  HPI: [name] is an 60 y.o. female who presents for hardware removal and left ankle fusion.  Patient has a history of diabetes with neuropathy who developed bimalleolar ankle fracture that I performed open reduction internal fixation.  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  Conservative measures were attempted including activity modification as well as bracing.  However this was not successful and she continued to have severe pain.  I discussed with her proceeding with ankle fusion with hardware removal.  She agrees to pursue this she presents for surgery today.  Past Medical History:  Diagnosis Date   Anemia    iron deficiency   Anxiety    Arthritis    Chronic kidney disease    COPD (chronic obstructive pulmonary disease) (HCC)    Depression    Diabetes mellitus    type 2   Diabetic foot ulcer (HCC) 04/2018   right ankle   Dyspnea    GERD (gastroesophageal reflux disease)    Headache(784.0)    History of colonic polyps    Hyperlipidemia    Hypertension    Myocardial infarction [facility])    Neuropathy    Peptic ulcer disease    Sleep apnea     Past Surgical History:  Procedure Laterality Date   ESOPHAGOGASTRODUODENOSCOPY  10/11/2011   Procedure: ESOPHAGOGASTRODUODENOSCOPY (EGD);  Surgeon: Erick Blinks, MD;  Location: [facility] END[facility];  Service: Gastroenterology;  Laterality: N/A;   ORIF ANKLE FRACTURE Left 05/29/2020   Procedure: OPEN REDUCTION INTERNAL FIXATION (ORIF) ANKLE FRACTURE;  Surgeon: Roby Lofts, MD;  Location: [facility] OR;  Service: Orthopedics;  Laterality: Left;   TUBAL LIGATION      Family History  Problem Relation Age of Onset   Alcohol abuse Other    Drug abuse Other    Arthritis Other    Diabetes Other    Depression Other    Hypertension Other    Kidney  disease Other    Cancer Other        Breast,Colon,Lung, and Prostate Cancer   Stomach cancer Father 54   Heart attack Brother 36   Diabetes type II Other        brother, both parents    Social History:  reports that she has been smoking cigarettes. She has a 25.00 pack-year smoking history. She has never used smokeless tobacco. She reports that she does not drink alcohol and does not use drugs.  Allergies:  Allergies  Allergen Reactions   Ceftriaxone Diarrhea and Nausea And Vomiting    Medications: I have reviewed the patient's current medications.  ROS: Constitutional: No fever or chills Vision: No changes in vision ENT: No difficulty swallowing CV: No chest pain Pulm: No SOB or wheezing GI: No nausea or vomiting GU: No urgency or inability to hold urine Skin: No poor wound healing Neurologic: No numbness or tingling Psychiatric: No depression or anxiety Heme: No bruising Allergic: No reaction to medications or food   Exam: Blood pressure (!) 141/56, pulse 82, temperature 97.8 F (36.6 C), temperature source Oral, resp. rate 18, height 5\' 6"  (1.676 m), weight (!) 158.8 kg, SpO2 95 %. General: No acute distress Orientation: Awake alert and oriented x3 Mood and Affect: Cooperative and pleasant Gait: Ambulates with a significantly antalgic gait Coordination and balance: Within normal limits  Left lower  extremity reveals severe swelling and deformity.  Incisions are healed.  She is able to actively dorsiflex and plantarflex her foot and toes.  She has diminished sensation throughout her foot.  No open lesions or wounds.  Warm well-perfused foot.  Right lower extremity: Skin without lesions. No tenderness to palpation. Full painless ROM, full strength in each muscle groups without evidence of instability.   Medical Decision Making: Data: Imaging: X-rays and CT scan are reviewed which shows hardware failure with displacement of the talus with Charcot arthropathy of the  tibial plafond.  Labs:  Results for orders placed or performed during the hospital encounter of 06/11/21 (from the past 24 hour(s))  Glucose, capillary     Status: Abnormal   Collection Time: 06/11/21  7:30 AM  Result Value Ref Range   Glucose-Capillary 196 (H) 70 - 99 mg/dL     Imaging or Labs ordered:  Results for orders placed or performed during the hospital encounter of 06/11/21 (from the past 24 hour(s))  Glucose, capillary     Status: Abnormal   Collection Time: 06/11/21  7:30 AM  Result Value Ref Range   Glucose-Capillary 196 (H) 70 - 99 mg/dL     Medical history and chart was reviewed and case discussed with medical provider.  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  Due to the severe deformity and unstable nature of her injury including continued pain I recommend proceeding with hardware removal and TTC nailing for ankle fusion.  Discussed risks and benefits with the patient.  Risks included infection, nerve or blood vessel injury, persistent nonunion, even the possibility of amputation.  The patient agreed to proceed with surgery and consent was obtained.  Roby Lofts, MD [facility] [phone] (office) [email or web address]

## 2021-06-11 NOTE — Op Note (Signed)
Orthopaedic Surgery Operative Note (CSN: [id number] ) Date of Surgery: 06/11/2021  Admit Date: 06/11/2021   Diagnoses: Pre-Op Diagnoses: Endstage left ankle arthritis Diabetic neuropathy  Post-Op Diagnosis: Same  Procedures: CPT 27870-Fusion of left tibiotalar joint CPT 28725-Fusion of left subtalar joint CPT 20680-Removal of hardware left ankle  Surgeons : Primary: Roby Lofts, MD  Assistant: Ulyses Southward, PA-C  Location: OR 7   Anesthesia: General   Antibiotics: Ancef 3gm preop with 1 gm vancomycin powder and 1.2 gm tobramycin powder   Tourniquet time:None used    Estimated Blood Loss:300 mL  Complications:None   Specimens:None   Implants: Implant Name Type Inv. Item Serial No. Manufacturer Lot No. LRB No. Used Action  PLATE POST FIB 7H 27 3.5 L - [id number] Plate PLATE POST FIB 7H 27 3.5 L  [employer]  Left 1 Explanted  SCREW CORT 2.7X16 STAR T8 EVOS - [id number] Screw SCREW CORT 2.7X16 STAR T8 EVOS  [employer]  Left 1 Explanted  SCREW CORT 2.7X22 T8 ST EVOS - [id number] Screw SCREW CORT 2.7X22 T8 ST EVOS  [employer]  Left 1 Explanted  SCREW CORT 3.5X13MM - [id number] Screw SCREW CORT 3.5X13MM  [employer]  Left 1 Explanted  SCREW CORT EVOS ST 3.5X12 - [id number] Screw SCREW CORT EVOS ST 3.5X12  [employer]  Left 2 Explanted  SCREW CTX 3.5X48MM EVOS - [id number] Screw SCREW CTX 3.5X48MM EVOS  [employer]  Left 2 Explanted  SCREW CTX 3.5X50MM EVOS - [id number] Screw SCREW CTX 3.5X50MM EVOS  [employer]  Left 2 Explanted  SCREW EVOS 2.7X18 LOCK T8 - [id number] Screw SCREW EVOS 2.7X18 LOCK T8  [employer]  Left 1 Explanted  SCREW LOCK ST EVOS 2.7X20 - [id number] Screw SCREW LOCK ST EVOS 2.7X20  [employer]  Left 2 Explanted  BONE CANC CHIPS [facility] - [id number] Bone Implant BONE [facility] CHIPS 20CC [id number] [facility]   Left 1 Implanted  NAIL ANKLE LOCK ANTE 10X180 - [id number] Nail NAIL ANKLE LOCK ANTE 10X180  [employer](ORTH,TRAU,BIO,SG) [id number] Left 1 Implanted  SCREW CORT TI DBL LEAD 5X36 - [id number] Screw SCREW CORT TI DBL LEAD 5X36  [employer](ORTH,TRAU,BIO,SG) [id number] Left 1 Implanted  SCREW CORT TI DBLE LEAD 5X28 - [id number] Screw SCREW CORT TI DBLE LEAD 5X28  [employer](ORTH,TRAU,BIO,SG) [id number] Left 1 Implanted  SCREW CORT TI DBLE LEAD 5X28 - [id number] Screw SCREW CORT TI DBLE LEAD 5X28  [employer](ORTH,TRAU,BIO,SG) [id number] Left 1 Implanted  SCREW CORT TI DBL LEAD 5X36 - [id number] Screw SCREW CORT TI DBL LEAD 5X36  [employer](ORTH,TRAU,BIO,SG) [id number] Left 1 Implanted  SCREW CORT TI DBL LEAD 5X65 - [id number] Screw SCREW CORT TI DBL LEAD 5X65  [employer](ORTH,TRAU,BIO,SG) [id number] Left 1 Implanted     Indications for Surgery: 60 year old female who sustained a bimalleolar ankle fracture in August 2021.  She underwent open reduction internal fixation with myself.  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  Conservative management was attempted however she failed this and required primary fusion of her subtalar and tibiotalar joint.  Risk and benefits were discussed with the patient.  Risks included but not limited to bleeding, infection, malunion, nonunion, hardware failure, hardware irritation, nerve and blood vessel injury, need for revision surgery, possibility of amputation, even the possibility anesthetic complications.  She agreed to proceed with  surgery and consent was obtained.  Operative Findings: 1.  Removal of hardware of previous lateral distal fibular plate and resection of the distal fibula to access the subtalar and tibiotalar joint. 2.  Fusion of the tibiotalar joint and the subtalar joint using [employer] [place] TTC nail 10x172mm with supplementation of the fusion site using crushed  cancellous allograft.  Procedure: The patient was identified in the preoperative holding area. Consent was confirmed with the patient and their family and all questions were answered. The operative extremity was marked after confirmation with the patient. she was then brought back to the operating room by our anesthesia colleagues.  She was carefully transferred over to a radiolucent flat top table.  She was placed under general anesthetic.  A bump was placed under her operative hip.  The left lower extremity was then prepped and draped in usual sterile fashion.  A timeout was performed to verify the patient, the procedure, and the extremity.  Preoperative antibiotics were dosed.  Fluoroscopic imaging showed the end-stage nature and Charcot nature of her left ankle.  I opened up the lateral incision and carried this down through skin and subcutaneous tissue until I reached the plate.  There was some heterotopic bone around the plate but no signs of infection.  I removed the proximal screws without difficulty.  The distal screws were broken and as result I was able to remove the screw heads without difficulty as well.  I then used a osteotome to create a plane between the plate and the fibula and elevate the plate off and removed this.  Using an oscillating saw I incised through the distal fibula at the level of the incisura.  I then proceeded to release the soft tissues of the distal fibula and remove the distal fibula to access the tibiotalar joint.  I then prepared the tibiotalar joint by using osteotomes and a curette and a drill bit to drill through the remainder of the talar body as well as the distal tibia.    Once I had prepared the joint adequately I then proceeded to place a guidewire for the retrograde TTC nail.  This was directed through the calcaneus up through the subtalar joint and through the tibiotalar joint into the metaphysis of the tibia.  I used an entry reamer to enter the 3 bones.  I  then passed a ball-tipped guidewire down the center of the canal.  I decided to place a 180 mm nail.  I then sequentially reamed 8.5 mm to 11 mm and then placed the 10 x 180 mm nail.  This was placed without difficulty.  I then proceeded to use the targeting arm to place a medial to lateral interlocking screw into the talus.  I then used the targeting arm to place 2 proximal interlocking screws.  I then used the compression device through the nail to compress the tibiotalar joint.  Once I had the fixation of the talus and the tibia I then provide another point fixation through the calcaneus from medial to lateral and then 1 from posterior to anterior.  The targeting arm was removed and final fluoroscopic imaging was obtained.  The incision was copiously irrigated.  Crushed cancellous allograft was placed into the lateral aspect of the tibiotalar joint.  A gram of vancomycin powder 1.2 g of tobramycin powder were placed into the incision.  Layered closure of 2-0 Monocryl and 2-0 nylon was used to close the skin.  Sterile dressing was applied.  A well-padded short  leg splint was placed.  The patient was then awoken from anesthesia and taken to the PACU in stable condition.  Post Op Plan/Instructions: Patient be nonweightbearing to left lower extremity.  She will receive postoperative Ancef.  She will receive Lovenox for DVT prophylaxis.  We will have her mobilize with physical and Occupational Therapy.  I was present and performed the entire surgery.  Ulyses Southward, PA-C did assist me throughout the case. An assistant was necessary given the difficulty in approach, maintenance of reduction and ability to instrument the fracture.   Truitt Merle, MD [facility]

## 2021-06-12 ENCOUNTER — Encounter (HOSPITAL_COMMUNITY): Payer: Self-pay | Admitting: Student

## 2021-06-12 LAB — BASIC METABOLIC PANEL
Anion gap: 11 (ref 5–15)
BUN: 17 mg/dL (ref 6–20)
CO2: 26 mmol/L (ref 22–32)
Calcium: 8.6 mg/dL — ABNORMAL LOW (ref 8.9–10.3)
Chloride: 101 mmol/L (ref 98–111)
Creatinine, Ser: 1.14 mg/dL — ABNORMAL HIGH (ref 0.44–1.00)
GFR, Estimated: 56 mL/min — ABNORMAL LOW (ref >60)
Glucose, Bld: 267 mg/dL — ABNORMAL HIGH (ref 70–99)
Potassium: 4.4 mmol/L (ref 3.5–5.1)
Sodium: 138 mmol/L (ref 135–145)

## 2021-06-12 LAB — CBC
HCT: 37.6 % (ref 36.0–46.0)
Hemoglobin: 12.4 g/dL (ref 12.0–15.0)
MCH: 30.2 pg (ref 26.0–34.0)
MCHC: 33 g/dL (ref 30.0–36.0)
MCV: 91.7 fL (ref 80.0–100.0)
Platelets: 225 10*3/uL (ref 150–400)
RBC: 4.1 MIL/uL (ref 3.87–5.11)
RDW: 14.5 % (ref 11.5–15.5)
WBC: 12.7 10*3/uL — ABNORMAL HIGH (ref 4.0–10.5)
nRBC: 0 % (ref 0.0–0.2)

## 2021-06-12 LAB — GLUCOSE, CAPILLARY
Glucose-Capillary: 293 mg/dL — ABNORMAL HIGH (ref 70–99)
Glucose-Capillary: 248 mg/dL — ABNORMAL HIGH (ref 70–99)
Glucose-Capillary: 231 mg/dL — ABNORMAL HIGH (ref 70–99)
Glucose-Capillary: 196 mg/dL — ABNORMAL HIGH (ref 70–99)

## 2021-06-12 MED ORDER — INSULIN ASPART 100 UNIT/ML IJ SOLN
6.0000 [IU] | Freq: Three times a day (TID) | INTRAMUSCULAR | Status: DC
  Administered 2021-06-12 – 2021-06-25 (×37): 6 [IU] via SUBCUTANEOUS

## 2021-06-12 MED ORDER — INSULIN GLARGINE-YFGN 100 UNIT/ML ~~LOC~~ SOLN
50.0000 [IU] | Freq: Two times a day (BID) | SUBCUTANEOUS | Status: DC
  Administered 2021-06-12 – 2021-06-17 (×11): 50 [IU] via SUBCUTANEOUS
  Filled 2021-06-12 (×13): qty 0.5

## 2021-06-12 MED ORDER — LURASIDONE HCL 40 MG PO TABS
40.0000 mg | ORAL_TABLET | Freq: Every day | ORAL | Status: DC
  Administered 2021-06-12 – 2021-06-25 (×14): 40 mg via ORAL
  Filled 2021-06-12 (×15): qty 1

## 2021-06-12 MED ORDER — KETOROLAC TROMETHAMINE 15 MG/ML IJ SOLN
15.0000 mg | Freq: Four times a day (QID) | INTRAMUSCULAR | Status: AC
  Administered 2021-06-12 – 2021-06-17 (×20): 15 mg via INTRAVENOUS
  Filled 2021-06-12 (×21): qty 1

## 2021-06-12 MED ORDER — NICOTINE 7 MG/24HR TD PT24
7.0000 mg | MEDICATED_PATCH | Freq: Every day | TRANSDERMAL | Status: DC
  Administered 2021-06-12 – 2021-06-25 (×14): 7 mg via TRANSDERMAL
  Filled 2021-06-12 (×14): qty 1

## 2021-06-12 MED ORDER — EMPAGLIFLOZIN 25 MG PO TABS
25.0000 mg | ORAL_TABLET | Freq: Every day | ORAL | Status: DC
  Administered 2021-06-12 – 2021-06-25 (×14): 25 mg via ORAL
  Filled 2021-06-12 (×15): qty 1

## 2021-06-12 MED ORDER — CHLORHEXIDINE GLUCONATE CLOTH 2 % EX PADS
6.0000 | MEDICATED_PAD | Freq: Every day | CUTANEOUS | Status: DC
  Administered 2021-06-12 – 2021-06-20 (×9): 6 via TOPICAL

## 2021-06-12 MED ORDER — VITAMIN D 25 MCG (1000 UNIT) PO TABS
1000.0000 [IU] | ORAL_TABLET | Freq: Every day | ORAL | Status: DC
  Administered 2021-06-12 – 2021-06-14 (×3): 1000 [IU] via ORAL
  Filled 2021-06-12 (×3): qty 1

## 2021-06-12 MED ORDER — ACETAMINOPHEN 325 MG PO TABS
325.0000 mg | ORAL_TABLET | Freq: Four times a day (QID) | ORAL | Status: DC | PRN
  Administered 2021-06-13: 650 mg via ORAL
  Filled 2021-06-12: qty 2

## 2021-06-12 MED ORDER — OXYCODONE HCL 5 MG PO TABS
5.0000 mg | ORAL_TABLET | Freq: Four times a day (QID) | ORAL | Status: DC | PRN
  Administered 2021-06-12 – 2021-06-15 (×5): 10 mg via ORAL
  Administered 2021-06-16: 5 mg via ORAL
  Administered 2021-06-16 – 2021-06-20 (×7): 10 mg via ORAL
  Administered 2021-06-20: 5 mg via ORAL
  Administered 2021-06-20 – 2021-06-25 (×6): 10 mg via ORAL
  Filled 2021-06-12 (×22): qty 2

## 2021-06-12 NOTE — Evaluation (Signed)
Occupational Therapy Evaluation Patient Details Name: [name] MRN: [id number] DOB: 22-Aug-1961 Today's Date: 06/12/2021    History of Present Illness 60 yo female with previous bimalleolar fracture of L ankle from last year was readmitted when the ORIF declined and had become a Charcot arthropathy with hardware failure in her foot.  Had ORIF with NWB orders.  Now s/p R ankle fusion and Removal of hardware PMHx:  SIRS, peripheral neuropathy, diabetic foot ulcer, CKD, HA, DM, COPD, anxiety and depression, anemia   Clinical Impression   Pt admitted with the above diagnoses and presents with below problem list. Pt will benefit from continued acute OT to address the below listed deficits and maximize independence with basic ADLs prior to d/c to venue below. PTA pt was mod I with ADLs, prior to onset of L ankle issues last year pt was independent with ADLs. Pt currently needs up to mod A with LB ADLs in sit<>stand. Pt able to stand 6x from elevated EOB position with min A while maintaining NWB RLE status. Pt maintained static standing about 10 seconds before needing to sit back down. Recommend having +2 assist available to attempt mobility away from EOB. At the very least could use Stedy for transfer to recliner/3n1. Will need bariatric size equipment.     Follow Up Recommendations  CIR    Equipment Recommendations  Other (comment) (defer to next venue of care)    Recommendations for Other Services Rehab consult     Precautions / Restrictions Precautions Precautions: Fall Restrictions Weight Bearing Restrictions: Yes LLE Weight Bearing: Non weight bearing      Mobility Bed Mobility Overal bed mobility: Needs Assistance Bed Mobility: Supine to Sit;Sit to Supine     Supine to sit: Min guard Sit to supine: Min guard   General bed mobility comments: min guard for safety. used rails and momentum. No physical assist    Transfers Overall transfer level: Needs assistance Equipment  used: Rolling walker (2 wheeled) Transfers: Sit to/from Stand Sit to Stand: Min assist         General transfer comment: to/from elevated EOB. Min A to steady. 6x sit<>stands. Struggled with endurance once in standing; inititing sitting at about 1o seconds in static stand position    Balance Overall balance assessment: Needs assistance Sitting-balance support: No upper extremity supported;Feet supported Sitting balance-Leahy Scale: Fair     Standing balance support: Bilateral upper extremity supported Standing balance-Leahy Scale: Poor Standing balance comment: Able to keep NWB, static standing position for about 10 seconds before needing to sit back down.                           ADL either performed or assessed with clinical judgement   ADL Overall ADL's : Needs assistance/impaired Eating/Feeding: Set up;Sitting   Grooming: Set up;Sitting   Upper Body Bathing: Set up;Sitting   Lower Body Bathing: Moderate assistance;Sit to/from stand;Sitting/lateral leans   Upper Body Dressing : Set up;Sitting   Lower Body Dressing: Moderate assistance;Sitting/lateral leans;Sit to/from stand                 General ADL Comments: 6x sit<>stands from EOB. Struggled with endurance. Able to side step 1x towards right side along EOB. Recommend +2 to attempt ambulation.     Vision         Perception     Praxis      Pertinent Vitals/Pain Pain Assessment: Faces Faces Pain Scale: Hurts even more  Pain Location: L ankle Pain Descriptors / Indicators: Throbbing Pain Intervention(s): Monitored during session;Repositioned;Patient requesting pain meds-RN notified;Limited activity within patient's tolerance     Hand Dominance Right   Extremity/Trunk Assessment Upper Extremity Assessment Upper Extremity Assessment: Generalized weakness   Lower Extremity Assessment Lower Extremity Assessment: Defer to PT evaluation   Cervical / Trunk Assessment Cervical / Trunk  Assessment: Other exceptions Cervical / Trunk Exceptions: large body habitus   Communication Communication Communication: No difficulties   Cognition Arousal/Alertness: Awake/alert Behavior During Therapy: WFL for tasks assessed/performed Overall Cognitive Status: Within Functional Limits for tasks assessed                                     General Comments       Exercises     Shoulder Instructions      Home Living Family/patient expects to be discharged to:: Private residence Living Arrangements: Alone Available Help at Discharge: Family Type of Home: Mobile home Home Access: Stairs to enter Secretary/administrator of Steps: 5 Entrance Stairs-Rails: Left Home Layout: One level     Bathroom Shower/Tub: Chief Strategy Officer: Standard Bathroom Accessibility: Yes How Accessible: Other (comment) (too narrow no access with DME/AD except for cane) Home Equipment: Walker - 2 wheels;Shower seat;Cane - single point;Wheelchair - manual;Adaptive equipment;Hand held Clinical biochemist: Reacher Additional Comments: daughter and son in law live 2 houses down.      Prior Functioning/Environment Level of Independence: Independent with assistive device(s)        Comments: walker vs cane.        OT Problem List: Decreased strength;Decreased activity tolerance;Impaired balance (sitting and/or standing);Decreased knowledge of use of DME or AE;Decreased knowledge of precautions;Pain;Obesity      OT Treatment/Interventions: Self-care/ADL training;Therapeutic exercise;DME and/or AE instruction;Therapeutic activities;Patient/family education;Balance training    OT Goals(Current goals can be found in the care plan section) Acute Rehab OT Goals Patient Stated Goal: eventually reduce/eliminate need for AD for mobility/transfers OT Goal Formulation: With patient Time For Goal Achievement: 06/26/21 Potential to Achieve Goals: Good ADL Goals Pt  Will Perform Lower Body Bathing: with min guard assist;sit to/from stand;with adaptive equipment Pt Will Perform Lower Body Dressing: with min guard assist;sit to/from stand Pt Will Transfer to Toilet: with min guard assist;ambulating Pt Will Perform Toileting - Clothing Manipulation and hygiene: with min guard assist;sit to/from stand;sitting/lateral leans  OT Frequency: Min 2X/week   Barriers to D/C:            Co-evaluation              AM-PAC OT "6 Clicks" Daily Activity     Outcome Measure Help from another person eating meals?: None Help from another person taking care of personal grooming?: None Help from another person toileting, which includes using toliet, bedpan, or urinal?: A Lot Help from another person bathing (including washing, rinsing, drying)?: A Lot Help from another person to put on and taking off regular upper body clothing?: A Little Help from another person to put on and taking off regular lower body clothing?: A Lot 6 Click Score: 17   End of Session Equipment Utilized During Treatment: Rolling walker;Oxygen (2L) Nurse Communication: Patient requests pain meds  Activity Tolerance: Patient tolerated treatment well Patient left: in bed;with call bell/phone within reach;with SCD's reapplied (RLE SCD reapplied)  OT Visit Diagnosis: Unsteadiness on feet (R26.81);Muscle weakness (generalized) (M62.81);Pain Pain - Right/Left: Left  Pain - part of body: Ankle and joints of foot                Time: 1129-1209 OT Time Calculation (min): 40 min Charges:  OT General Charges $OT Visit: 1 Visit OT Evaluation $OT Eval Moderate Complexity: 1 Mod OT Treatments $Self Care/Home Management : 8-22 mins  Raynald Kemp, OT Acute Rehabilitation Services Pager: [phone] Office: [phone]   Pilar Grammes 06/12/2021, 12:46 PM

## 2021-06-12 NOTE — Progress Notes (Signed)
Orthopaedic Trauma Progress Note  SUBJECTIVE: Doing okay this morning, notes some throbbing pain to the foot and ankle.  Pain medications helping some, notes that Toradol is most beneficial.  Patient is on chronic pain medication and notes that she takes her Percocet 10-325 mg every 5 hours at baseline.  Noted to have some drainage through the back of her splint last night.  Blood sugars have been running slightly high while here in the hospital (267 this morning).  Patient does note the blood sugars run about 200 at baseline.  I have restarted her Jardiance this morning in addition to continuing SSI.  No chest pain. No SOB. No nausea/vomiting. No other complaints.  Has not been up out of bed.  Tolerating diet and fluids.  Patient is aware that she will require SNF at discharge.  She is hoping to get into a rehab facility in [place].  She is also requesting the facility have a bariatric bed available.  OBJECTIVE:  Vitals:   06/12/21 0400 06/12/21 0816  BP: (!) 135/43 (!) 141/60  Pulse: 85 90  Resp:  17  Temp: 98.8 F (37.1 C) 98.7 F (37.1 C)  SpO2:  94%    General: Sitting up in bed, no acute distress Respiratory: No increased work of breathing.  Left lower extremity: Well-padded, well fitting splint in place.  There is some drainage noted to the posterior aspect of the splint.  Ace wrap is removed and this area of the splint was reinforced before Ace wrap's were reapplied.  Able to wiggle toes.  Decreased sensation to the toes, this is baseline.  Nontender above splint.  Toes warm and well-perfused  IMAGING: Stable post op imaging.   LABS:  Results for orders placed or performed during the hospital encounter of 06/11/21 (from the past 24 hour(s))  Glucose, capillary     Status: Abnormal   Collection Time: 06/11/21  1:20 PM  Result Value Ref Range   Glucose-Capillary 229 (H) 70 - 99 mg/dL  Glucose, capillary     Status: Abnormal   Collection Time: 06/11/21  2:50 PM  Result Value Ref  Range   Glucose-Capillary 243 (H) 70 - 99 mg/dL  Glucose, capillary     Status: Abnormal   Collection Time: 06/11/21  5:35 PM  Result Value Ref Range   Glucose-Capillary 212 (H) 70 - 99 mg/dL  CBC     Status: Abnormal   Collection Time: 06/11/21  5:50 PM  Result Value Ref Range   WBC 14.2 (H) 4.0 - 10.5 K/uL   RBC 4.46 3.87 - 5.11 MIL/uL   Hemoglobin 13.3 12.0 - 15.0 g/dL   HCT 54.2 70.6 - 23.7 %   MCV 91.9 80.0 - 100.0 fL   MCH 29.8 26.0 - 34.0 pg   MCHC 32.4 30.0 - 36.0 g/dL   RDW 62.8 31.5 - 17.6 %   Platelets 226 150 - 400 K/uL   nRBC 0.0 0.0 - 0.2 %  Creatinine, serum     Status: Abnormal   Collection Time: 06/11/21  5:50 PM  Result Value Ref Range   Creatinine, Ser 1.14 (H) 0.44 - 1.00 mg/dL   GFR, Estimated 56 (L) >60 mL/min  VITAMIN D 25 Hydroxy (Vit-D Deficiency, Fractures)     Status: None   Collection Time: 06/11/21  5:50 PM  Result Value Ref Range   Vit D, 25-Hydroxy 31.00 30 - 100 ng/mL  Hemoglobin A1c     Status: Abnormal   Collection Time: 06/11/21  5:50  PM  Result Value Ref Range   Hgb A1c MFr Bld 8.0 (H) 4.8 - 5.6 %   Mean Plasma Glucose 182.9 mg/dL  Glucose, capillary     Status: Abnormal   Collection Time: 06/11/21  9:16 PM  Result Value Ref Range   Glucose-Capillary 357 (H) 70 - 99 mg/dL  Basic metabolic panel     Status: Abnormal   Collection Time: 06/12/21  1:56 AM  Result Value Ref Range   Sodium 138 135 - 145 mmol/L   Potassium 4.4 3.5 - 5.1 mmol/L   Chloride 101 98 - 111 mmol/L   CO2 26 22 - 32 mmol/L   Glucose, Bld 267 (H) 70 - 99 mg/dL   BUN 17 6 - 20 mg/dL   Creatinine, Ser 7.89 (H) 0.44 - 1.00 mg/dL   Calcium 8.6 (L) 8.9 - 10.3 mg/dL   GFR, Estimated 56 (L) >60 mL/min   Anion gap 11 5 - 15  CBC     Status: Abnormal   Collection Time: 06/12/21  1:56 AM  Result Value Ref Range   WBC 12.7 (H) 4.0 - 10.5 K/uL   RBC 4.10 3.87 - 5.11 MIL/uL   Hemoglobin 12.4 12.0 - 15.0 g/dL   HCT 38.1 01.7 - 51.0 %   MCV 91.7 80.0 - 100.0 fL   MCH 30.2  26.0 - 34.0 pg   MCHC 33.0 30.0 - 36.0 g/dL   RDW 25.8 52.7 - 78.2 %   Platelets 225 150 - 400 K/uL   nRBC 0.0 0.0 - 0.2 %  Glucose, capillary     Status: Abnormal   Collection Time: 06/12/21  6:19 AM  Result Value Ref Range   Glucose-Capillary 293 (H) 70 - 99 mg/dL    ASSESSMENT: [name] is a 60 y.o. female, 1 Day Post-Op s/p HARDWARE REMOVAL AND LEFT ANKLE FUSION  CV/Blood loss: Hemoglobin 12.4 this morning, stable from pre-op.  Hemodynamically stable  PLAN: Weightbearing: NWB LLE Incisional and dressing care: Dressings left intact until follow-up and Reinforce dressings as needed  Showering: Okay to begin showering, keep splint dry Orthopedic device(s): Splint LLE Pain management:  1. Tylenol 325-650 mg q 6 hours PRN 2. Robaxin 500 mg q 6 hours PRN 3. Oxycodone 5-10 mg q 6 hours PRN breakthrough pain 4. Percocet 10-325 mg q 4 hours PRN 5. Neurontin 100 mg TID 6. Dilaudid 0.5-1 mg q 4 hours PRN 7.  Toradol 15 mg every 6 hours  VTE prophylaxis: Lovenox, SCDs ID: Ancef 2gm post op Foley/Lines: No foley, KVO IVFs Impediments to Fracture Healing: Vitamin D level low normal at 31, would benefit from 1000 units daily D3 supplementation Dispo: PT/OT evaluation today, patient will require SNF.  TOC aware and following.  Have consulted diabetes coordinator for assistance with glucose control.  Appreciate their assistance.  Follow - up plan: 2 weeks after discharge for repeat x-rays and wound check  Contact information:  Truitt Merle MD, Ulyses Southward PA-C. After hours and holidays please check [email or web address] for group call information for [facility]   Kendy Haston A. Michaelyn Barter, PA-C [phone] (office) [email or web address]

## 2021-06-12 NOTE — Progress Notes (Signed)
Inpatient Rehab Admissions Coordinator Note:  Per therapy recommendations patient was screened for CIR candidacy by Stephania Fragmin, PT. At this time, pt does not appear to demonstrate medical necessity to support a CIR admission, and [facility] Medicare unlikely to approve CIR request.  We will not pursue a rehab consult at this time.  Recommend other rehab venues to be pursued.  Please contact me with any questions.  Stephania Fragmin, [place] [phone] @TODAY @ 1:51 PM

## 2021-06-12 NOTE — Progress Notes (Signed)
Inpatient Diabetes Program Recommendations  AACE/ADA: New Consensus Statement on Inpatient Glycemic Control (2015)  Target Ranges:  Prepandial:   less than 140 mg/dL      Peak postprandial:   less than 180 mg/dL (1-2 hours)      Critically ill patients:  140 - 180 mg/dL   Lab Results  Component Value Date   GLUCAP 293 (H) 06/12/2021   HGBA1C 8.0 (H) 06/11/2021    Review of Glycemic Control Results for [name], [name] (MRN [id number]) as of 06/12/2021 09:35  Ref. Range 06/11/2021 17:35 06/11/2021 21:16 06/12/2021 06:19  Glucose-Capillary Latest Ref Range: 70 - 99 mg/dL 335 (H) 456 (H) 256 (H)   Diabetes history: DM 2 Outpatient Diabetes medications:  Humalog 12-20 units tid with meals, Lantus 100 units bid, Jardiance 25 mg daily, Ozempic 1 mg weekly Current orders for Inpatient glycemic control:  Jardiance 25 mg daily Novolog 0-15 units tid with meals and HS  Inpatient Diabetes Program Recommendations:    Please add Semglee 50 units bid (1/2 of what patient takes at home-basal) and Novolog meal coverage 6 units tid with meals.   Thanks,  Beryl Meager, RN, BC-ADM Inpatient Diabetes Coordinator Pager [phone] (8a-5p)

## 2021-06-12 NOTE — Evaluation (Signed)
Physical Therapy Evaluation Patient Details Name: [name] MRN: [id number] DOB: Apr 06, 1961 Today's Date: 06/12/2021   History of Present Illness  59 yo female with previous bimalleolar fracture of L ankle from last year was readmitted when the ORIF declined and had become a Charcot arthropathy with hardware failure in her foot.  Had ORIF with NWB orders.  Now s/p R ankle fusion and Removal of hardware PMHx:  SIRS, peripheral neuropathy, diabetic foot ulcer, CKD, HA, DM, COPD, anxiety and depression, anemia  Clinical Impression  Pt was seen for mobility on side of bed, able to get there with min guard and bed rail.  Practiced 10 STS transitions, with RLE supported using R side bed rail.  Pt declines to try to stand up fully and pivot to the chair.  She has a geographical barrier of stairs to enter house, and would certainly benefit from installation of a ramp to ease her transition home.    Follow her along to work on increasing her ability to transfer to the chair, to increase standing balance control and to improve NWB control on LLE.  Pt has been dealing with this issue over the last year and will be expected to make regular progress in balance, strength and standing endurance.      Follow Up Recommendations CIR    Equipment Recommendations  None recommended by PT (has been home with equipment a year)    Recommendations for Other Services Rehab consult     Precautions / Restrictions Precautions Precautions: Fall Restrictions Weight Bearing Restrictions: Yes LLE Weight Bearing: Non weight bearing      Mobility  Bed Mobility Overal bed mobility: Needs Assistance Bed Mobility: Supine to Sit;Sit to Supine     Supine to sit: Supervision;Min guard Sit to supine: Supervision;Min guard   General bed mobility comments: pt could lift LLE onto bed after being up to laterally transfer    Transfers Overall transfer level: Needs assistance Equipment used: Rolling walker (2  wheeled) Transfers: Sit to/from Stand Sit to Stand: Min assist         General transfer comment: lateral scoot is S and sit to stand partially 10x with min guard to  min assist  Ambulation/Gait             General Gait Details: declined to try  Stairs            Wheelchair Mobility    Modified Rankin (Stroke Patients Only)       Balance Overall balance assessment: Needs assistance Sitting-balance support: Feet supported Sitting balance-Leahy Scale: Fair     Standing balance support: Bilateral upper extremity supported Standing balance-Leahy Scale: Poor Standing balance comment: NWB maintained for all sit to stand work and on side of bed                             Pertinent Vitals/Pain Pain Assessment: Faces Faces Pain Scale: Hurts little more Pain Location: L ankle Pain Descriptors / Indicators: Operative site guarding Pain Intervention(s): Monitored during session;Premedicated before session;Repositioned;Ice applied    Home Living Family/patient expects to be discharged to:: Private residence Living Arrangements: Alone Available Help at Discharge: Family Type of Home: Mobile home Home Access: Stairs to enter Entrance Stairs-Rails: Left Entrance Stairs-Number of Steps: 5 Home Layout: One level Home Equipment: Walker - 2 wheels;Shower seat;Cane - single point;Wheelchair - manual;Adaptive equipment;Hand held shower head Additional Comments: daughter is nearby    Prior Function Level  of Independence: Independent with assistive device(s)         Comments: RW vs SPC     Hand Dominance   Dominant Hand: Right    Extremity/Trunk Assessment   Upper Extremity Assessment Upper Extremity Assessment: Defer to OT evaluation    Lower Extremity Assessment Lower Extremity Assessment: Generalized weakness;LLE deficits/detail LLE Deficits / Details: splinted post op with NWB order LLE: Unable to fully assess due to immobilization LLE  Coordination: decreased gross motor    Cervical / Trunk Assessment Cervical / Trunk Assessment: Other exceptions (body habitus) Cervical / Trunk Exceptions: large body habitus  Communication   Communication: No difficulties  Cognition Arousal/Alertness: Awake/alert Behavior During Therapy: WFL for tasks assessed/performed Overall Cognitive Status: Within Functional Limits for tasks assessed                                        General Comments General comments (skin integrity, edema, etc.): Pt is up to stand and scoot, declines to get to chair.  Talked with her about being up to chair next time, and will get a drop arm chair if possible to make lateral scooting easier    Exercises     Assessment/Plan    PT Assessment Patient needs continued PT services  PT Problem List         PT Treatment Interventions      PT Goals (Current goals can be found in the Care Plan section)  Acute Rehab PT Goals Patient Stated Goal: to get home and walk independently PT Goal Formulation: With patient Time For Goal Achievement: 06/26/21 Potential to Achieve Goals: Good    Frequency Min 4X/week   Barriers to discharge        Co-evaluation               AM-PAC PT "6 Clicks" Mobility  Outcome Measure Help needed turning from your back to your side while in a flat bed without using bedrails?: None Help needed moving from lying on your back to sitting on the side of a flat bed without using bedrails?: A Little Help needed moving to and from a bed to a chair (including a wheelchair)?: A Little Help needed standing up from a chair using your arms (e.g., wheelchair or bedside chair)?: A Little Help needed to walk in hospital room?: A Lot Help needed climbing 3-5 steps with a railing? : A Lot 6 Click Score: 17    End of Session Equipment Utilized During Treatment: Gait belt;Oxygen Activity Tolerance: Patient limited by fatigue;Treatment limited secondary to medical  complications (Comment) Patient left: in bed;with call bell/phone within reach;with bed alarm set Nurse Communication: Mobility status PT Visit Diagnosis: Muscle weakness (generalized) (M62.81);Pain;Difficulty in walking, not elsewhere classified (R26.2) Pain - Right/Left: Left Pain - part of body: Ankle and joints of foot    Time: 4097-3532 PT Time Calculation (min) (ACUTE ONLY): 27 min   Charges:   PT Evaluation $PT Eval Moderate Complexity: 1 Mod PT Treatments $Therapeutic Activity: 8-22 mins       Ivar Drape 06/12/2021, 2:45 PM  Samul Dada, PT MS Acute Rehab Dept. Number: [facility] [id number] and [facility] [phone]

## 2021-06-13 LAB — CBC
HCT: 35.3 % — ABNORMAL LOW (ref 36.0–46.0)
Hemoglobin: 11.6 g/dL — ABNORMAL LOW (ref 12.0–15.0)
MCH: 30.6 pg (ref 26.0–34.0)
MCHC: 32.9 g/dL (ref 30.0–36.0)
MCV: 93.1 fL (ref 80.0–100.0)
Platelets: 190 10*3/uL (ref 150–400)
RBC: 3.79 MIL/uL — ABNORMAL LOW (ref 3.87–5.11)
RDW: 14.8 % (ref 11.5–15.5)
WBC: 9.8 10*3/uL (ref 4.0–10.5)
nRBC: 0 % (ref 0.0–0.2)

## 2021-06-13 LAB — GLUCOSE, CAPILLARY
Glucose-Capillary: 144 mg/dL — ABNORMAL HIGH (ref 70–99)
Glucose-Capillary: 187 mg/dL — ABNORMAL HIGH (ref 70–99)
Glucose-Capillary: 197 mg/dL — ABNORMAL HIGH (ref 70–99)
Glucose-Capillary: 135 mg/dL — ABNORMAL HIGH (ref 70–99)

## 2021-06-13 LAB — BASIC METABOLIC PANEL
Anion gap: 7 (ref 5–15)
BUN: 19 mg/dL (ref 6–20)
CO2: 26 mmol/L (ref 22–32)
Calcium: 8.3 mg/dL — ABNORMAL LOW (ref 8.9–10.3)
Chloride: 104 mmol/L (ref 98–111)
Creatinine, Ser: 1.15 mg/dL — ABNORMAL HIGH (ref 0.44–1.00)
GFR, Estimated: 55 mL/min — ABNORMAL LOW (ref >60)
Glucose, Bld: 188 mg/dL — ABNORMAL HIGH (ref 70–99)
Potassium: 4.3 mmol/L (ref 3.5–5.1)
Sodium: 137 mmol/L (ref 135–145)

## 2021-06-13 MED ORDER — ENOXAPARIN SODIUM 80 MG/0.8ML IJ SOSY
0.5000 mg/kg | PREFILLED_SYRINGE | INTRAMUSCULAR | Status: DC
  Administered 2021-06-14 – 2021-06-25 (×12): 80 mg via SUBCUTANEOUS
  Filled 2021-06-13 (×12): qty 0.8

## 2021-06-13 MED ORDER — ENOXAPARIN SODIUM 40 MG/0.4ML IJ SOSY
40.0000 mg | PREFILLED_SYRINGE | INTRAMUSCULAR | Status: AC
  Administered 2021-06-13: 40 mg via SUBCUTANEOUS
  Filled 2021-06-13: qty 0.4

## 2021-06-13 NOTE — Consult Note (Signed)
   [facility] CM Inpatient Consult   06/13/2021  ILINA XU 1960/10/28 [id number]  [facility] [ACO] Patient: [employer]  Patient screened for hospitalization to assess for potential [facility] service needs for post hospital transition.  Patient was reviewed to check for potential of restart of [facility] services.  Review of patient's medical record reveals patient is for a skilled nursing facility transition verses a CIR.  Plan:  Currently, patient is to transition to a facility for post hospital disposition and needs are to be met at that level of care. No THN follow up planned at a facility.  For questions contact:   Natividad Brood, RN BSN [facility] Liaison  [phone] business mobile phone Toll free office [phone]  Fax number: [phone] [email or web address]

## 2021-06-13 NOTE — Progress Notes (Signed)
Orthopaedic Trauma Progress Note  SUBJECTIVE: Doing okay this morning. Patient feels achy  and muscle soreness all over. Was able to get up to EOB with therapies yesterday. No chest pain. No SOB. No nausea/vomiting. No other complaints. Blood sugars better controlled. Tolerating diet and fluids.  Patient is aware that she will require SNF at discharge.  She is hoping to get into a rehab facility in [place].  She is also requesting the facility have a bariatric bed available.  OBJECTIVE:  Vitals:   06/13/21 0835 06/13/21 0846  BP:  (!) 129/51  Pulse:  85  Resp:  17  Temp:  98.8 F (37.1 C)  SpO2: 96% 94%    General: Sitting up in bed, no acute distress Respiratory: No increased work of breathing.  Left lower extremity: Well-padded, well fitting splint in place. Able to wiggle toes.  Decreased sensation to the toes, this is baseline.  Nontender above splint.  Toes warm and well-perfused  IMAGING: Stable post op imaging.   LABS:  Results for orders placed or performed during the hospital encounter of 06/11/21 (from the past 24 hour(s))  Glucose, capillary     Status: Abnormal   Collection Time: 06/12/21 12:39 PM  Result Value Ref Range   Glucose-Capillary 248 (H) 70 - 99 mg/dL  Glucose, capillary     Status: Abnormal   Collection Time: 06/12/21  5:22 PM  Result Value Ref Range   Glucose-Capillary 231 (H) 70 - 99 mg/dL  Glucose, capillary     Status: Abnormal   Collection Time: 06/12/21  9:08 PM  Result Value Ref Range   Glucose-Capillary 196 (H) 70 - 99 mg/dL  Basic metabolic panel     Status: Abnormal   Collection Time: 06/13/21 12:57 AM  Result Value Ref Range   Sodium 137 135 - 145 mmol/L   Potassium 4.3 3.5 - 5.1 mmol/L   Chloride 104 98 - 111 mmol/L   CO2 26 22 - 32 mmol/L   Glucose, Bld 188 (H) 70 - 99 mg/dL   BUN 19 6 - 20 mg/dL   Creatinine, Ser 9.37 (H) 0.44 - 1.00 mg/dL   Calcium 8.3 (L) 8.9 - 10.3 mg/dL   GFR, Estimated 55 (L) >60 mL/min   Anion gap 7 5 - 15   CBC     Status: Abnormal   Collection Time: 06/13/21 12:57 AM  Result Value Ref Range   WBC 9.8 4.0 - 10.5 K/uL   RBC 3.79 (L) 3.87 - 5.11 MIL/uL   Hemoglobin 11.6 (L) 12.0 - 15.0 g/dL   HCT 16.9 (L) 67.8 - 93.8 %   MCV 93.1 80.0 - 100.0 fL   MCH 30.6 26.0 - 34.0 pg   MCHC 32.9 30.0 - 36.0 g/dL   RDW 10.1 75.1 - 02.5 %   Platelets 190 150 - 400 K/uL   nRBC 0.0 0.0 - 0.2 %  Glucose, capillary     Status: Abnormal   Collection Time: 06/13/21  6:54 AM  Result Value Ref Range   Glucose-Capillary 144 (H) 70 - 99 mg/dL    ASSESSMENT: [name] is a 60 y.o. female, 2 Days Post-Op s/p HARDWARE REMOVAL AND LEFT ANKLE FUSION  CV/Blood loss: ABLA, Hemoglobin 11.6 this morning. Hemodynamically stable.   PLAN: Weightbearing: NWB LLE Incisional and dressing care: Dressings left intact until follow-up and Reinforce dressings as needed  Showering: Okay to begin showering, keep splint dry Orthopedic device(s): Splint LLE Pain management:  1. Tylenol 325-650 mg q 6  hours PRN 2. Robaxin 500 mg q 6 hours PRN 3. Oxycodone 5-10 mg q 6 hours PRN breakthrough pain 4. Percocet 10-325 mg q 4 hours PRN 5. Neurontin 100 mg TID 6. Dilaudid 0.5-1 mg q 4 hours PRN 7.  Toradol 15 mg every 6 hours  VTE prophylaxis: Lovenox, SCDs ID: Ancef 2gm post op Foley/Lines: No foley, KVO IVFs Impediments to Fracture Healing: Vitamin D level low normal at 31, would benefit from 1000 units daily D3 supplementation Dispo: Therapies as tolerated, PT/OT recommended CIR but found not to be a good CIR candidate. Patient will require SNF.  TOC aware and following.   Follow - up plan: 2 weeks after discharge for repeat x-rays and wound check  Contact information:  Truitt Merle MD, Ulyses Southward PA-C. After hours and holidays please check [email or web address] for group call information for [facility]   [name] Brunelle A. Michaelyn Barter, PA-C [phone] (office) [email or web address]

## 2021-06-13 NOTE — Progress Notes (Signed)
Physical Therapy Treatment Patient Details Name: [name] MRN: [id number] DOB: 08-15-1961 Today's Date: 06/13/2021    History of Present Illness 60 yo female with previous bimalleolar fracture of L ankle from last year was readmitted when the ORIF declined and had become a Charcot arthropathy with hardware failure in her foot. Pt s/p left ankle fusion and Removal of hardware. PMHx:  SIRS, peripheral neuropathy, diabetic foot ulcer, CKD, HA, DM, COPD, anxiety and depression, anemia    PT Comments    Pt admitted with above diagnosis. Pt was able to take pivotal steps with left PFRW to 3N1 and then to the recliner maintaining weight bearing on left LE.  Pt happy to get onto 3n1.  Pt making progress plan to work on balance and endurance.  Pt will benefit from skilled PT to increase their independence and safety with mobility to allow discharge to the venue listed below.      Follow Up Recommendations  SNF     Equipment Recommendations  None recommended by PT (has been home with equipment a year)    Recommendations for Other Services       Precautions / Restrictions Precautions Precautions: Fall Restrictions LLE Weight Bearing: Non weight bearing    Mobility  Bed Mobility Overal bed mobility: Needs Assistance Bed Mobility: Supine to Sit;Sit to Supine     Supine to sit: Supervision;Min guard Sit to supine: Supervision;Min guard   General bed mobility comments: Pt was able to come to EOB without assist.    Transfers Overall transfer level: Needs assistance Equipment used: Rolling walker (2 wheeled);Left platform walker Transfers: Sit to/from Stand;Stand Pivot Transfers Sit to Stand: Min assist;+2 safety/equipment;From elevated surface Stand pivot transfers: Min assist;+2 safety/equipment       General transfer comment: Pt stood to left platform RW with min to min gaurd assist. Pt pivoted to the 3n1 with left PFRW and urinated and had BM.  PT cleaned pt with total assist  and then she pivoted to the recliner with min assist trying to get pt to hop however pt was scooting foot along as she could not hop.  she was maintaining NWB on left LE.  Ambulation/Gait             General Gait Details: declined to try   Stairs             Wheelchair Mobility    Modified Rankin (Stroke Patients Only)       Balance Overall balance assessment: Needs assistance Sitting-balance support: Feet supported Sitting balance-Leahy Scale: Fair     Standing balance support: Bilateral upper extremity supported Standing balance-Leahy Scale: Poor Standing balance comment: NWB maintained for all sit to stand work and on side of bed                            Cognition Arousal/Alertness: Awake/alert Behavior During Therapy: WFL for tasks assessed/performed Overall Cognitive Status: Within Functional Limits for tasks assessed                                        Exercises      General Comments        Pertinent Vitals/Pain Pain Assessment: Faces Faces Pain Scale: Hurts little more Pain Location: L ankle Pain Descriptors / Indicators: Operative site guarding Pain Intervention(s): Limited activity within patient's tolerance;Monitored during session;Repositioned  Home Living                      Prior Function            PT Goals (current goals can now be found in the care plan section) Acute Rehab PT Goals Patient Stated Goal: to get home and walk independently Progress towards PT goals: Progressing toward goals    Frequency    Min 3X/week      PT Plan Discharge plan needs to be updated;Frequency needs to be updated    Co-evaluation              AM-PAC PT "6 Clicks" Mobility   Outcome Measure  Help needed turning from your back to your side while in a flat bed without using bedrails?: None Help needed moving from lying on your back to sitting on the side of a flat bed without using  bedrails?: A Little Help needed moving to and from a bed to a chair (including a wheelchair)?: A Little Help needed standing up from a chair using your arms (e.g., wheelchair or bedside chair)?: A Little Help needed to walk in hospital room?: A Lot Help needed climbing 3-5 steps with a railing? : A Lot 6 Click Score: 17    End of Session Equipment Utilized During Treatment: Gait belt;Oxygen Activity Tolerance: Patient limited by fatigue Patient left: with call bell/phone within reach;in chair;with chair alarm set Nurse Communication: Mobility status PT Visit Diagnosis: Muscle weakness (generalized) (M62.81);Pain;Difficulty in walking, not elsewhere classified (R26.2) Pain - Right/Left: Left Pain - part of body: Ankle and joints of foot     Time: 0086-7619 PT Time Calculation (min) (ACUTE ONLY): 36 min  Charges:  $Gait Training: 8-22 mins $Therapeutic Activity: 8-22 mins                     Colbi Staubs M,PT Acute Rehab Services [phone] (pager)    Bevelyn Buckles 06/13/2021, 4:27 PM

## 2021-06-14 LAB — BASIC METABOLIC PANEL
Anion gap: 5 (ref 5–15)
BUN: 21 mg/dL — ABNORMAL HIGH (ref 6–20)
CO2: 28 mmol/L (ref 22–32)
Calcium: 8.5 mg/dL — ABNORMAL LOW (ref 8.9–10.3)
Chloride: 104 mmol/L (ref 98–111)
Creatinine, Ser: 1.23 mg/dL — ABNORMAL HIGH (ref 0.44–1.00)
GFR, Estimated: 51 mL/min — ABNORMAL LOW (ref >60)
Glucose, Bld: 144 mg/dL — ABNORMAL HIGH (ref 70–99)
Potassium: 4.3 mmol/L (ref 3.5–5.1)
Sodium: 137 mmol/L (ref 135–145)

## 2021-06-14 LAB — GLUCOSE, CAPILLARY
Glucose-Capillary: 128 mg/dL — ABNORMAL HIGH (ref 70–99)
Glucose-Capillary: 160 mg/dL — ABNORMAL HIGH (ref 70–99)
Glucose-Capillary: 195 mg/dL — ABNORMAL HIGH (ref 70–99)
Glucose-Capillary: 198 mg/dL — ABNORMAL HIGH (ref 70–99)

## 2021-06-14 NOTE — Progress Notes (Signed)
RE: [name]  Date of Birth: 04-May-1961  Date: 06/14/2021  To Whom It May Concern:  Please be advised that the above-named patient will require a short-term nursing home stay - anticipated 30 days or less for rehabilitation and strengthening. The plan is for return home.

## 2021-06-14 NOTE — NC FL2 (Signed)
[place] MEDICAID FL2 LEVEL OF CARE SCREENING TOOL     IDENTIFICATION  Patient Name: [name] Birthdate: 1960-11-15 Sex: female Admission Date (Current Location): 06/11/2021  [facility] and [place] Number:  Producer, television/film/video and Address:  [facility], 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   Closed bimalleolar fracture of left ankle 05/06/2021   Closed left ankle fracture 05/28/2020   Moderate protein malnutrition (HCC)    Adult BMI 50.0-59.9 kg/sq m (HCC)    Normocytic anemia 04/21/2018   Diabetic foot infection (HCC) 04/21/2018   Mild renal insufficiency 04/21/2018   Diabetic infection of right foot (HCC)    Preventative health care 04/14/2011   DYSMETABOLIC SYNDROME 12/08/2010   ULNAR NEUROPATHY, BILATERAL 10/01/2010   Insulin-requiring or dependent type II diabetes mellitus (HCC) 09/11/2010   HYPERLIPIDEMIA 09/11/2010   Morbid (severe) obesity due to excess calories (HCC) 09/11/2010   TOBACCO USE 09/11/2010   Depression with anxiety 09/11/2010   Essential hypertension 09/11/2010   GERD 09/11/2010   PEPTIC ULCER DISEASE 09/11/2010   COLONIC POLYPS, HX OF 09/11/2010   Lumbosacral spondylosis 02/27/2010    Orientation RESPIRATION BLADDER Height & Weight     Self, Time, Situation, Place  O2 (Nasal Cannula 2 liters) Continent Weight: (!) 350 lb (158.8 kg) Height:  5\' 6"  (167.6 cm)  BEHAVIORAL SYMPTOMS/MOOD NEUROLOGICAL BOWEL NUTRITION STATUS      Continent Diet (Please see discharge  summary)  AMBULATORY STATUS COMMUNICATION OF NEEDS Skin   Limited Assist Verbally Other (Comment) (Incision closed ankle left,compression wrap,clean,dry,intact)                       Personal Care Assistance Level of Assistance  Bathing, Feeding, Dressing Bathing Assistance: Limited assistance Feeding assistance: Independent Dressing Assistance: Limited assistance     Functional Limitations Info  Sight, Hearing, Speech Sight Info: Adequate Hearing Info: Adequate Speech Info: Adequate    SPECIAL CARE FACTORS FREQUENCY  PT (By licensed PT), OT (By licensed OT)     PT Frequency: 5x min weekly OT Frequency: 5x min weekly            Contractures Contractures Info: Not present    Additional Factors Info  Code Status, Allergies, Psychotropic, Insulin Sliding Scale Code Status Info: FULL Allergies Info: Ceftriaxone Psychotropic Info: buPROPion ER (WELLBUTRIN SR) 12 hr tablet 200 mg 2 times daily,QUEtiapine (SEROQUEL) tablet 100 mg daily at bedtime,traZODone (DESYREL) tablet 100 mg daily at bedtime Insulin Sliding Scale Info: insulin aspart (novoLOG) injection 0-5 Units daily at bedtime,insulin aspart (novoLOG) injection 6 Units 3 times daily with meals,insulin glargine-yfgn (SEMGLEE) injection 50 Units 2 times daily       Current Medications (06/14/2021):  This is the current hospital active medication list Current Facility-Administered Medications  Medication Dose Route Frequency Provider Last Rate Last Admin   0.9 % NaCl with KCl 20 mEq/ L  infusion   Intravenous Continuous 08/14/2021, PA-C 50 mL/hr at 06/11/21 1753 New Bag at 06/11/21  1753   acetaminophen (TYLENOL) tablet 325-650 mg  325-650 mg Oral Q6H PRN Despina Hidden, PA-C   650 mg at 06/13/21 0050   amLODipine (NORVASC) tablet 10 mg  10 mg Oral Daily Despina Hidden, PA-C   10 mg at 06/14/21 2500   atorvastatin (LIPITOR) tablet 10 mg  10 mg Oral Daily Despina Hidden, PA-C   10 mg at 06/14/21 0920   buPROPion  ER (WELLBUTRIN SR) 12 hr tablet 200 mg  200 mg Oral BID Despina Hidden, PA-C   200 mg at 06/14/21 3704   Chlorhexidine Gluconate Cloth 2 % PADS 6 each  6 each Topical Daily Haddix, Gillie Manners, MD   6 each at 06/13/21 0957   docusate sodium (COLACE) capsule 100 mg  100 mg Oral BID Despina Hidden, PA-C   100 mg at 06/14/21 8889   DULoxetine (CYMBALTA) DR capsule 90 mg  90 mg Oral Daily Despina Hidden, PA-C   90 mg at 06/14/21 1694   empagliflozin (JARDIANCE) tablet 25 mg  25 mg Oral Daily Despina Hidden, PA-C   25 mg at 06/14/21 5038   enoxaparin (LOVENOX) injection 80 mg  0.5 mg/kg Subcutaneous Q24H Despina Hidden, PA-C   80 mg at 06/14/21 8828   ferrous sulfate tablet 325 mg  325 mg Oral Q breakfast Despina Hidden, PA-C   325 mg at 06/14/21 0034   fluticasone (FLONASE) 50 MCG/ACT nasal spray 2 spray  2 spray Each Nare Daily PRN Despina Hidden, PA-C       hydrALAZINE (APRESOLINE) tablet 25 mg  25 mg Oral TID Ulyses Southward A, PA-C   25 mg at 06/14/21 9179   HYDROmorphone (DILAUDID) injection 0.5-1 mg  0.5-1 mg Intravenous Q4H PRN Ulyses Southward A, PA-C       insulin aspart (novoLOG) injection 0-5 Units  0-5 Units Subcutaneous QHS Despina Hidden, PA-C   5 Units at 06/11/21 2146   insulin aspart (novoLOG) injection 6 Units  6 Units Subcutaneous TID WC Despina Hidden, PA-C   6 Units at 06/14/21 1221   insulin glargine-yfgn (SEMGLEE) injection 50 Units  50 Units Subcutaneous BID Despina Hidden, PA-C   50 Units at 06/14/21 1505   ketorolac (TORADOL) 15 MG/ML injection 15 mg  15 mg Intravenous Q6H Ulyses Southward A, PA-C   15 mg at 06/14/21 1221   lurasidone (LATUDA) tablet 40 mg  40 mg Oral Daily Despina Hidden, PA-C   40 mg at 06/14/21 6979   methocarbamol (ROBAXIN) tablet 500 mg  500 mg Oral Q6H PRN Despina Hidden, PA-C   500 mg at 06/12/21 2131   Or   methocarbamol (ROBAXIN) 500 mg in dextrose 5 % 50 mL IVPB  500 mg Intravenous Q6H PRN Despina Hidden, PA-C       metoCLOPramide (REGLAN) tablet  5-10 mg  5-10 mg Oral Q8H PRN Despina Hidden, PA-C       Or   metoCLOPramide (REGLAN) injection 5-10 mg  5-10 mg Intravenous Q8H PRN Ulyses Southward A, PA-C       metoprolol tartrate (LOPRESSOR) tablet 50 mg  50 mg Oral BID Ulyses Southward A, PA-C   50 mg at 06/14/21 1002   mometasone-formoterol (DULERA) 100-5 MCG/ACT inhaler 2 puff  2 puff Inhalation BID Ulyses Southward A, PA-C   2 puff at 06/14/21 0920   nicotine (NICODERM CQ - dosed in mg/24 hr) patch 7 mg  7 mg Transdermal Daily  Despina Hidden, PA-C   7 mg at 06/14/21 0929   ondansetron (ZOFRAN) tablet 4 mg  4 mg Oral Q6H PRN Despina Hidden, PA-C       Or   ondansetron [facility]) injection 4 mg  4 mg Intravenous Q6H PRN Despina Hidden, PA-C       oxyCODONE-acetaminophen (PERCOCET/ROXICET) 5-325 MG per tablet 1 tablet  1 tablet Oral Q4H PRN Despina Hidden, PA-C   1 tablet at 06/13/21 2215   And   oxyCODONE (Oxy IR/ROXICODONE) immediate release tablet 5 mg  5 mg Oral Q4H PRN Ulyses Southward A, PA-C   5 mg at 06/14/21 1521   oxyCODONE (Oxy IR/ROXICODONE) immediate release tablet 5-10 mg  5-10 mg Oral Q6H PRN Ulyses Southward A, PA-C   10 mg at 06/14/21 1001   pantoprazole (PROTONIX) EC tablet 80 mg  80 mg Oral Daily Despina Hidden, PA-C   80 mg at 06/14/21 8588   polyethylene glycol (MIRALAX / GLYCOLAX) packet 17 g  17 g Oral Daily PRN Despina Hidden, PA-C       pregabalin (LYRICA) capsule 225 mg  225 mg Oral TID Ulyses Southward A, PA-C   225 mg at 06/14/21 1515   QUEtiapine (SEROQUEL) tablet 100 mg  100 mg Oral QHS Ulyses Southward A, PA-C   100 mg at 06/13/21 2214   traZODone (DESYREL) tablet 100 mg  100 mg Oral QHS Ulyses Southward A, PA-C   100 mg at 06/13/21 2215     Discharge Medications: Please see discharge summary for a list of discharge medications.  Relevant Imaging Results:  Relevant Lab Results:   Additional Information [id number] Patient will need Bariatric Bed ,Both Covid Vaccines and Booster  Terrial Rhodes, LCSWA

## 2021-06-14 NOTE — Progress Notes (Signed)
Orthopaedic Trauma Progress Note  SUBJECTIVE: Doing well today, just had a bath.  All over body soreness is much improved from yesterday.  No new complaints today.  Is medically stable for discharge Continues working with therapies.  Patient is medically stable for discharge, she is hopeful to get in to rehab facility early next week.  OBJECTIVE:  Vitals:   06/14/21 0812 06/14/21 0915  BP: (!) 119/54 (!) 147/58  Pulse: 67 74  Resp: 17   Temp: 97.7 F (36.5 C)   SpO2: 98% 100%    General: Sitting up in bed, no acute distress Respiratory: No increased work of breathing.  Left lower extremity: Well-padded, well fitting splint in place. Able to wiggle toes.  Decreased sensation to the toes, this is baseline.  Nontender above splint.  Toes warm and well-perfused  IMAGING: Stable post op imaging.   LABS:  Results for orders placed or performed during the hospital encounter of 06/11/21 (from the past 24 hour(s))  Glucose, capillary     Status: Abnormal   Collection Time: 06/13/21  4:08 PM  Result Value Ref Range   Glucose-Capillary 197 (H) 70 - 99 mg/dL  Glucose, capillary     Status: Abnormal   Collection Time: 06/13/21  9:32 PM  Result Value Ref Range   Glucose-Capillary 135 (H) 70 - 99 mg/dL  Basic metabolic panel     Status: Abnormal   Collection Time: 06/14/21  1:17 AM  Result Value Ref Range   Sodium 137 135 - 145 mmol/L   Potassium 4.3 3.5 - 5.1 mmol/L   Chloride 104 98 - 111 mmol/L   CO2 28 22 - 32 mmol/L   Glucose, Bld 144 (H) 70 - 99 mg/dL   BUN 21 (H) 6 - 20 mg/dL   Creatinine, Ser 6.07 (H) 0.44 - 1.00 mg/dL   Calcium 8.5 (L) 8.9 - 10.3 mg/dL   GFR, Estimated 51 (L) >60 mL/min   Anion gap 5 5 - 15  Glucose, capillary     Status: Abnormal   Collection Time: 06/14/21  8:08 AM  Result Value Ref Range   Glucose-Capillary 128 (H) 70 - 99 mg/dL  Glucose, capillary     Status: Abnormal   Collection Time: 06/14/21 11:11 AM  Result Value Ref Range   Glucose-Capillary 160  (H) 70 - 99 mg/dL    ASSESSMENT: Krystol C Couvillon is a 60 y.o. female, 3 Days Post-Op s/p HARDWARE REMOVAL AND LEFT ANKLE FUSION  CV/Blood loss: ABLA, Hemoglobin 11.6 this morning. Hemodynamically stable.   PLAN: Weightbearing: NWB LLE Incisional and dressing care: Dressings left intact until follow-up and Reinforce dressings as needed  Showering: Okay to begin showering, keep splint dry Orthopedic device(s): Splint LLE Pain management:  1. Tylenol 325-650 mg q 6 hours PRN 2. Robaxin 500 mg q 6 hours PRN 3. Oxycodone 5-10 mg q 6 hours PRN breakthrough pain 4. Percocet 10-325 mg q 4 hours PRN 5. Neurontin 100 mg TID 6. Dilaudid 0.5-1 mg q 4 hours PRN 7. Toradol 15 mg every 6 hours  VTE prophylaxis: Lovenox, SCDs ID: Ancef 2gm post op Foley/Lines: No foley, KVO IVFs Impediments to Fracture Healing: Vitamin D level low normal at 31 Dispo: Therapies as tolerated, PT/OT recommended CIR but found not to be a good CIR candidate. Patient will require SNF.  TOC aware and following.  Continue to monitor creatinine.  May need to discontinue Lovenox and/or Toradol if this continues to increase. Follow - up plan: 2 weeks after discharge  for repeat x-rays and wound check  Contact information:  Truitt Merle MD, Ulyses Southward PA-C. After hours and holidays please check [email or web address] for group call information for [facility]   Dorsel Flinn A. Michaelyn Barter, PA-C [phone] (office) [email or web address]

## 2021-06-14 NOTE — Plan of Care (Signed)

## 2021-06-14 NOTE — TOC Initial Note (Signed)
Transition of Care [facility]) - Initial/Assessment Note    Patient Details  Name: [name] MRN: [id number] Date of Birth: 11-Jul-1961  Transition of Care [facility]) CM/SW Contact:    Terrial Rhodes, LCSWA Phone Number: 06/14/2021, 3:33 PM  Clinical Narrative:                  CSW received consult for possible SNF placement at time of discharge. CSW spoke with patient regarding PT recommendation of SNF placement at time of discharge. Patient comes from home alone. Patient expressed understanding of PT recommendation and is agreeable to SNF placement at time of discharge. Patient gave CSW permission to fax out initial referral near the [place] and [place] area.  Patient has received the COVID vaccines and booster. No further questions reported at this time. CSW to continue to follow and assist with discharge planning needs.   Expected Discharge Plan: Skilled Nursing Facility Barriers to Discharge: Continued Medical Work up   Patient Goals and CMS Choice Patient states their goals for this hospitalization and ongoing recovery are:: to go to SNF CMS Medicare.gov Compare Post Acute Care list provided to:: Patient Choice offered to / list presented to : Patient  Expected Discharge Plan and Services Expected Discharge Plan: Skilled Nursing Facility In-house Referral: Clinical Social Work     Living arrangements for the past 2 months: Mobile Home                                      Prior Living Arrangements/Services Living arrangements for the past 2 months: Mobile Home Lives with:: Self Patient language and need for interpreter reviewed:: Yes Do you feel safe going back to the place where you live?: No   SNF  Need for Family Participation in Patient Care: Yes (Comment) Care giver support system in place?: Yes (comment)   Criminal Activity/Legal Involvement Pertinent to Current Situation/Hospitalization: No - Comment as needed  Activities of Daily Living Home Assistive  Devices/Equipment: Walker (specify type) (front wheel) ADL Screening (condition at time of admission) Patient's cognitive ability adequate to safely complete daily activities?: Yes Is the patient deaf or have difficulty hearing?: No Does the patient have difficulty seeing, even when wearing glasses/contacts?: No Does the patient have difficulty concentrating, remembering, or making decisions?: No Patient able to express need for assistance with ADLs?: Yes Does the patient have difficulty dressing or bathing?: No Independently performs ADLs?: Yes (appropriate for developmental age) Does the patient have difficulty walking or climbing stairs?: Yes Weakness of Legs: Left Weakness of Arms/Hands: None  Permission Sought/Granted Permission sought to share information with : Case Manager, Family Supports, Magazine features editor Permission granted to share information with : Yes, Verbal Permission Granted  Share Information with NAME: Dahlia Client  Permission granted to share info w AGENCY: SNF  Permission granted to share info w Relationship: daughter  Permission granted to share info w Contact Information: Dahlia Client [phone]  Emotional Assessment   Attitude/Demeanor/Rapport: Gracious Affect (typically observed): Calm Orientation: : Oriented to Self, Oriented to Place, Oriented to  Time, Oriented to Situation Alcohol / Substance Use: Not Applicable Psych Involvement: No (comment)  Admission diagnosis:  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.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] Patient Active Problem List   Diagnosis Date Noted   Arthritis of left ankle 06/11/2021   Closed bimalleolar fracture of left ankle 05/06/2021   Closed left ankle fracture 05/28/2020   Moderate protein malnutrition (HCC)  Adult BMI 50.0-59.9 kg/sq m (HCC)    Normocytic anemia 04/21/2018   Diabetic foot infection (HCC) 04/21/2018   Mild renal insufficiency 04/21/2018   Diabetic infection of right foot [facility])    Preventative health care 04/14/2011    DYSMETABOLIC SYNDROME 12/08/2010   ULNAR NEUROPATHY, BILATERAL 10/01/2010   Insulin-requiring or dependent type II diabetes mellitus (HCC) 09/11/2010   HYPERLIPIDEMIA 09/11/2010   Morbid (severe) obesity due to excess calories (HCC) 09/11/2010   TOBACCO USE 09/11/2010   Depression with anxiety 09/11/2010   Essential hypertension 09/11/2010   GERD 09/11/2010   PEPTIC ULCER DISEASE 09/11/2010   COLONIC POLYPS, HX OF 09/11/2010   Lumbosacral spondylosis 02/27/2010   PCP:  Wilmer Floor., MD Pharmacy:   [facility] [phone] Rosalita Levan, [place] - [address] AT [facility] [address] [place] [address] Phone: [phone] Fax: [phone]  [id number] - [place], [place] - [address] [facility] [place] [address] Phone: [phone] Fax: [phone]     Social Determinants of Health (SDOH) Interventions    Readmission Risk Interventions No flowsheet data found.

## 2021-06-15 LAB — GLUCOSE, CAPILLARY
Glucose-Capillary: 147 mg/dL — ABNORMAL HIGH (ref 70–99)
Glucose-Capillary: 211 mg/dL — ABNORMAL HIGH (ref 70–99)
Glucose-Capillary: 210 mg/dL — ABNORMAL HIGH (ref 70–99)
Glucose-Capillary: 253 mg/dL — ABNORMAL HIGH (ref 70–99)

## 2021-06-15 NOTE — Progress Notes (Signed)
   ORTHOPAEDIC PROGRESS NOTE  s/p Procedure(s): Left ANKLE FUSION  SUBJECTIVE: Reports mild pain about operative site. Decrease sensation in foot secondary to diabetic neuropathy  No chest pain. No SOB. No nausea/vomiting. No other complaints.  OBJECTIVE: PE: Left lower extremity in splint.  Decreased sensation secondary to diabetic neuropathy. 2 + dorsalis pedis pulses.  Splint fitting well no excess drainage from surgical wounds  Vitals:   06/14/21 2108 06/15/21 0722  BP: (!) 145/65 (!) 121/58  Pulse: 76 67  Resp: 18 18  Temp: 98.3 F (36.8 C) 97.8 F (36.6 C)  SpO2: 93% 98%     ASSESSMENT: [name] is a 60 y.o. female doing well postoperatively.  PLAN: Weightbearing: NWB LLE Insicional and dressing care: Dressings left intact until follow-up Orthopedic device(s): None Showering: must keep cast dry VTE prophylaxis: Lovenox 40mg  qd  30 days Pain control: well controlled Follow - up plan:  awaiting SNF placement Patient is very concerned about developing a post op infection.  She is asking about antibiotics.  I have deferred this to the primary team of [name] and Dr information:   Exander Shaul A. Samson Frederic Physician Assistant [facility] [phone]  06/15/2021, 9:44 AM   Patient ID: 08/15/2021, female   DOB: July 17, 1961, 60 y.o.   MRN: [id number]

## 2021-06-15 NOTE — Plan of Care (Signed)

## 2021-06-15 NOTE — Progress Notes (Addendum)
Held blood pressure meds. Message MD, no response. Will continue to monitor and assess patient.

## 2021-06-16 LAB — BASIC METABOLIC PANEL
Anion gap: 6 (ref 5–15)
BUN: 18 mg/dL (ref 6–20)
CO2: 30 mmol/L (ref 22–32)
Calcium: 8.7 mg/dL — ABNORMAL LOW (ref 8.9–10.3)
Chloride: 101 mmol/L (ref 98–111)
Creatinine, Ser: 1.12 mg/dL — ABNORMAL HIGH (ref 0.44–1.00)
GFR, Estimated: 57 mL/min — ABNORMAL LOW (ref >60)
Glucose, Bld: 167 mg/dL — ABNORMAL HIGH (ref 70–99)
Potassium: 4.4 mmol/L (ref 3.5–5.1)
Sodium: 137 mmol/L (ref 135–145)

## 2021-06-16 LAB — CBC
HCT: 34.8 % — ABNORMAL LOW (ref 36.0–46.0)
Hemoglobin: 11.3 g/dL — ABNORMAL LOW (ref 12.0–15.0)
MCH: 30.3 pg (ref 26.0–34.0)
MCHC: 32.5 g/dL (ref 30.0–36.0)
MCV: 93.3 fL (ref 80.0–100.0)
Platelets: 210 10*3/uL (ref 150–400)
RBC: 3.73 MIL/uL — ABNORMAL LOW (ref 3.87–5.11)
RDW: 14.1 % (ref 11.5–15.5)
WBC: 8.7 10*3/uL (ref 4.0–10.5)
nRBC: 0 % (ref 0.0–0.2)

## 2021-06-16 LAB — GLUCOSE, CAPILLARY
Glucose-Capillary: 79 mg/dL (ref 70–99)
Glucose-Capillary: 165 mg/dL — ABNORMAL HIGH (ref 70–99)
Glucose-Capillary: 106 mg/dL — ABNORMAL HIGH (ref 70–99)
Glucose-Capillary: 223 mg/dL — ABNORMAL HIGH (ref 70–99)
Glucose-Capillary: 188 mg/dL — ABNORMAL HIGH (ref 70–99)

## 2021-06-16 NOTE — Progress Notes (Signed)
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 Pt s/p left ankle fusion and Removal of hardware. PMHx:  SIRS, peripheral neuropathy, diabetic foot ulcer, CKD, HA, DM, COPD, anxiety and depression, anemia   OT comments  Pt supine in bed and agreeable to OT session. Focused on ADLs at EOB with setup for UB ADLs and up to mod assist for LB ADLs.  Educated on lateral lean techniques for bathing/dressing, pt reports using AE at baseline to manage socks.  She stood x 1 at EOB (for bed pad change) with min assist from elevated bed, cueing for technique using L platform RW and limited tolerance. Good awareness to NWB L LE precautions, min cueing for repositioning before standing to ensure adherence. Updated dc plan to SNF, as patient will require increased strength/tolerance for ADLs, mobility prior to dc home.    Recommendations for follow up therapy are one component of a multi-disciplinary discharge planning process, led by the attending physician.  Recommendations may be updated based on patient status, additional functional criteria and insurance authorization.    Follow Up Recommendations  SNF    Equipment Recommendations  Other (comment) (defer to next venue)    Recommendations for Other Services      Precautions / Restrictions Precautions Precautions: Fall Restrictions Weight Bearing Restrictions: Yes LLE Weight Bearing: Non weight bearing       Mobility Bed Mobility Overal bed mobility: Needs Assistance Bed Mobility: Supine to Sit;Sit to Supine     Supine to sit: Supervision Sit to supine: Supervision   General bed mobility comments: no assist required, using rails    Transfers Overall  transfer level: Needs assistance Equipment used: Left platform walker Transfers: Sit to/from Stand Sit to Stand: Min assist;From elevated surface         General transfer comment: min assist to power up from elevated EOB with cueing for hand placement, safety and mgmt of L LE to ensure NWB. limited tolerance    Balance Overall balance assessment: Needs assistance Sitting-balance support: No upper extremity supported;Feet supported Sitting balance-Leahy Scale: Good     Standing balance support: Bilateral upper extremity supported;During functional activity Standing balance-Leahy Scale: Poor Standing balance comment: relies on BUE support and external support with limited tolerance due to NWB status,                           ADL either performed or assessed with clinical judgement   ADL Overall ADL's : Needs assistance/impaired     Grooming: Set up;Sitting   Upper Body Bathing: Set up;Sitting   Lower Body Bathing: Moderate assistance;Sitting/lateral leans Lower Body Bathing Details (indicate cue type and reason): assist for  RLE (typically uses AE at home), and educated on lateral lean for washing bottom with assist required Upper Body Dressing : Set up;Sitting   Lower Body Dressing: Moderate assistance;Sit to/from stand;Sitting/lateral leans Lower Body Dressing Details (indicate cue type and reason): assist for R sock (typically uses AE at home), sit to stand with min assist but relies on BUE support (educated on lateral leans but difficulty due to body habitus)             Functional mobility during ADLs: Minimal assistance (L  platform RW) General ADL Comments: sit to stand from EOB using L platform RW with cueing for hand placement, L LE mgmt to adhere to NWB precatuions and from elevated surface     Vision       Perception     Praxis      Cognition Arousal/Alertness: Awake/alert Behavior During Therapy: WFL for tasks assessed/performed Overall  Cognitive Status: Within Functional Limits for tasks assessed                                          Exercises     Shoulder Instructions       General Comments      Pertinent Vitals/ Pain       Pain Assessment: Faces Faces Pain Scale: Hurts little more Pain Location: L ankle Pain Descriptors / Indicators: Operative site guarding Pain Intervention(s): Limited activity within patient's tolerance;Monitored during session;Repositioned  Home Living                                          Prior Functioning/Environment              Frequency  Min 2X/week        Progress Toward Goals  OT Goals(current goals can now be found in the care plan section)  Progress towards OT goals: Progressing toward goals  Acute Rehab OT Goals Patient Stated Goal: to get home and walk independently OT Goal Formulation: With patient  Plan Frequency remains appropriate;Discharge plan needs to be updated    Co-evaluation                 AM-PAC OT "6 Clicks" Daily Activity     Outcome Measure   Help from another person eating meals?: None Help from another person taking care of personal grooming?: None Help from another person toileting, which includes using toliet, bedpan, or urinal?: A Lot Help from another person bathing (including washing, rinsing, drying)?: A Lot Help from another person to put on and taking off regular upper body clothing?: A Little Help from another person to put on and taking off regular lower body clothing?: A Lot 6 Click Score: 17    End of Session Equipment Utilized During Treatment: Other (comment) (L platform RW)  OT Visit Diagnosis: Unsteadiness on feet (R26.81);Muscle weakness (generalized) (M62.81);Pain Pain - Right/Left: Left Pain - part of body: Ankle and joints of foot   Activity Tolerance Patient tolerated treatment well   Patient Left in bed;with call bell/phone within reach;with bed alarm set    Nurse Communication Mobility status        Time: 3810-1751 OT Time Calculation (min): 19 min  Charges: OT General Charges $OT Visit: 1 Visit OT Treatments $Self Care/Home Management : 8-22 mins  Barry Brunner, OT Acute Rehabilitation Services Pager [phone] Office [phone]   Chancy Milroy 06/16/2021, 11:39 AM

## 2021-06-16 NOTE — TOC Progression Note (Signed)
Transition of Care [facility]) - Progression Note    Patient Details  Name: [name] MRN: [id number] Date of Birth: Oct 28, 1960  Transition of Care [facility]) CM/SW Contact  Carley Hammed, [place] Phone Number: 06/16/2021, 2:27 PM  Clinical Narrative:    At this time pt's PASSR is still pending, which is a barrier to discharge.TOC will continue to follow for DC planning.   Expected Discharge Plan: Skilled Nursing Facility Barriers to Discharge: Continued Medical Work up  Expected Discharge Plan and Services Expected Discharge Plan: Skilled Nursing Facility In-house Referral: Clinical Social Work     Living arrangements for the past 2 months: Mobile Home                                       Social Determinants of Health (SDOH) Interventions    Readmission Risk Interventions No flowsheet data found.

## 2021-06-16 NOTE — Progress Notes (Signed)
Orthopaedic Trauma Progress Note  SUBJECTIVE: Doing ok today, notes that pain has been increased over the last 2 days. Feels like the ankle is throbbing. Pain medications helps some. Serum creatinine remains slightly elevated but is improving over last 2 days. Patient remains stable for discharge to SNF. TOC following and working on placement in [place] or [place] area.   OBJECTIVE:  Vitals:   06/15/21 2151 06/16/21 0812  BP: (!) 145/45 (!) 138/54  Pulse: 87 75  Resp:  17  Temp: 98.4 F (36.9 C) 98.7 F (37.1 C)  SpO2: 94% 97%    General: Sitting up in bed, no acute distress Respiratory: No increased work of breathing.  Left lower extremity: Well-padded, well fitting splint in place. Able to wiggle toes.  Decreased sensation to the toes, this is baseline.  Nontender above splint.  Toes warm and well-perfused  IMAGING: Stable post op imaging.   LABS:  Results for orders placed or performed during the hospital encounter of 06/11/21 (from the past 24 hour(s))  Glucose, capillary     Status: Abnormal   Collection Time: 06/15/21 10:53 AM  Result Value Ref Range   Glucose-Capillary 211 (H) 70 - 99 mg/dL  Glucose, capillary     Status: Abnormal   Collection Time: 06/15/21  2:29 PM  Result Value Ref Range   Glucose-Capillary 210 (H) 70 - 99 mg/dL  Glucose, capillary     Status: Abnormal   Collection Time: 06/15/21 10:03 PM  Result Value Ref Range   Glucose-Capillary 253 (H) 70 - 99 mg/dL   Comment 1 Notify RN   Glucose, capillary     Status: None   Collection Time: 06/16/21  6:31 AM  Result Value Ref Range   Glucose-Capillary 79 70 - 99 mg/dL  Glucose, capillary     Status: Abnormal   Collection Time: 06/16/21  7:42 AM  Result Value Ref Range   Glucose-Capillary 165 (H) 70 - 99 mg/dL    ASSESSMENT: [name] is a 60 y.o. female, 5 Days Post-Op s/p HARDWARE REMOVAL AND LEFT ANKLE FUSION  CV/Blood loss: ABLA, Hemoglobin 11.6 this morning. Hemodynamically stable.    PLAN: Weightbearing: NWB LLE Incisional and dressing care: Dressings left intact until follow-up and Reinforce dressings as needed  Showering: Okay shower, keep splint dry Orthopedic device(s): Splint LLE Pain management:  1. Tylenol 325-650 mg q 6 hours PRN 2. Robaxin 500 mg q 6 hours PRN 3. Oxycodone 5-10 mg q 6 hours PRN breakthrough pain 4. Percocet 10-325 mg q 4 hours PRN 5. Lyrica 225 mg TID 6. Dilaudid 0.5-1 mg q 4 hours PRN 7. Toradol 15 mg every 6 hours  VTE prophylaxis: Lovenox, SCDs ID: Ancef 2gm post op completed Foley/Lines: No foley, KVO IVFs Impediments to Fracture Healing: Vitamin D level low normal at 31 Dispo: Therapies as tolerated, PT/OT recommended CIR but found not to be a good CIR candidate. Patient will require SNF.  TOC aware and following.   Follow - up plan: 2 weeks after discharge for repeat x-rays and wound check  Contact information:  Truitt Merle MD, Ulyses Southward PA-C. After hours and holidays please check [email or web address] for group call information for [facility]   [name] Swift A. Michaelyn Barter, PA-C [phone] (office) [email or web address]

## 2021-06-17 LAB — GLUCOSE, CAPILLARY
Glucose-Capillary: 98 mg/dL (ref 70–99)
Glucose-Capillary: 80 mg/dL (ref 70–99)
Glucose-Capillary: 141 mg/dL — ABNORMAL HIGH (ref 70–99)
Glucose-Capillary: 190 mg/dL — ABNORMAL HIGH (ref 70–99)

## 2021-06-17 MED ORDER — INSULIN GLARGINE-YFGN 100 UNIT/ML ~~LOC~~ SOLN
45.0000 [IU] | Freq: Two times a day (BID) | SUBCUTANEOUS | Status: DC
  Administered 2021-06-17 – 2021-06-25 (×16): 45 [IU] via SUBCUTANEOUS
  Filled 2021-06-17 (×18): qty 0.45

## 2021-06-17 NOTE — Progress Notes (Signed)
Orthopaedic Trauma Progress Note  SUBJECTIVE: No new complaints today. Tolerating fluid and diets. Continues working with therapies. Patient remains stable for discharge. TOC following and working on placement in [place] or [place] area.   OBJECTIVE:  Vitals:   06/16/21 2118 06/17/21 0648  BP: (!) 146/58 (!) 150/68  Pulse: 74 70  Resp: 16 16  Temp: 98.6 F (37 C) 98.7 F (37.1 C)  SpO2: 92% 94%    General: Sitting up in bed, no acute distress Respiratory: No increased work of breathing.  Left lower extremity: Well-padded, well fitting splint in place. Able to wiggle toes.  Decreased sensation to the toes, this is baseline.  Nontender above splint.  Toes warm and well-perfused  IMAGING: Stable post op imaging.   LABS:  Results for orders placed or performed during the hospital encounter of 06/11/21 (from the past 24 hour(s))  Glucose, capillary     Status: Abnormal   Collection Time: 06/16/21 11:14 AM  Result Value Ref Range   Glucose-Capillary 106 (H) 70 - 99 mg/dL  Glucose, capillary     Status: Abnormal   Collection Time: 06/16/21  3:35 PM  Result Value Ref Range   Glucose-Capillary 223 (H) 70 - 99 mg/dL  Glucose, capillary     Status: Abnormal   Collection Time: 06/16/21  8:40 PM  Result Value Ref Range   Glucose-Capillary 188 (H) 70 - 99 mg/dL  Glucose, capillary     Status: None   Collection Time: 06/17/21  6:51 AM  Result Value Ref Range   Glucose-Capillary 98 70 - 99 mg/dL    ASSESSMENT: [name] is a 60 y.o. female, 6 Days Post-Op s/p HARDWARE REMOVAL AND LEFT ANKLE FUSION  CV/Blood loss: Hgb stable. Hemodynamically stable.   PLAN: Weightbearing: NWB LLE Incisional and dressing care: Dressings left intact until follow-up and Reinforce dressings as needed  Showering: Okay shower, keep splint dry Orthopedic device(s): Splint LLE Pain management:  1. Tylenol 325-650 mg q 6 hours PRN 2. Robaxin 500 mg q 6 hours PRN 3. Oxycodone 5-10 mg q 6 hours PRN  breakthrough pain 4. Percocet 10-325 mg q 4 hours PRN 5. Lyrica 225 mg TID 6. Dilaudid 0.5-1 mg q 4 hours PRN 7. Toradol 15 mg every 6 hours  VTE prophylaxis: Lovenox, SCDs ID: Ancef 2gm post op completed Foley/Lines: No foley, KVO IVFs Impediments to Fracture Healing: Vitamin D level low normal at 31 Dispo: Therapies as tolerated, PT/OT recommending SNF.  TOC aware and following, working on placement.   Follow - up plan: 2 weeks after discharge for repeat x-rays and wound check  Contact information:  Truitt Merle MD, Ulyses Southward PA-C. After hours and holidays please check [email or web address] for group call information for [facility]   Amiyrah Lamere A. Michaelyn Barter, PA-C [phone] (office) [email or web address]

## 2021-06-17 NOTE — Care Management Important Message (Signed)
Important Message  Patient Details  Name: [name] MRN: [id number] Date of Birth: October 15, 1960   Medicare Important Message Given:  Yes     Ezio Wieck Stefan Church 06/17/2021, 3:37 PM

## 2021-06-17 NOTE — Progress Notes (Signed)
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 Pt s/p left ankle fusion and Removal of hardware. PMHx:  SIRS, peripheral neuropathy, diabetic foot ulcer, CKD, HA, DM, COPD, anxiety and depression, anemia    PT Comments    Patient received in bed, pleasant and cooperative; reviewed NWB precautions L LE, then continued practicing functional transfers. Tried transferring to left side today to challenge balance and sequencing, needed assist managing RW as well as multiple cues to maintain NWB L LE. Still having quite a bit of difficulty hopping and scoots R foot around on the floor when pivoting. Might benefit from trying double platform walker vs EVA walker. Left in chair with all needs met, chair alarm active this morning. Will continue efforts.     Recommendations for follow up therapy are one component of a multi-disciplinary discharge planning process, led by the attending physician.  Recommendations may be updated based on patient status, additional functional criteria and insurance authorization.  Follow Up Recommendations  SNF     Equipment Recommendations  None recommended by PT (has been home with equipment for a year)    Recommendations for Other Services       Precautions / Restrictions Precautions Precautions: Fall Restrictions Weight Bearing Restrictions: Yes LLE Weight Bearing: Non weight bearing     Mobility  Bed Mobility Overal bed mobility: Needs Assistance Bed Mobility: Supine to Sit     Supine to sit: Supervision;HOB elevated     General bed mobility comments: no assist required, using rails and HOB up    Transfers Overall transfer level: Needs assistance Equipment used:  Left platform walker Transfers: Sit to/from Stand Sit to Stand: Min assist;+2 physical assistance Stand pivot transfers: Min assist;+2 safety/equipment       General transfer comment: MinAx2 to power up and safely pivot today; practiced transfers going to the left today and needed cues to maintain NWB L LE as well as help managing platform RW. Still with quite a bit of difficulty with hopping.  Ambulation/Gait             General Gait Details: unable to while safely maintaining NWB L LE   Stairs             Wheelchair Mobility    Modified Rankin (Stroke Patients Only)       Balance Overall balance assessment: Needs assistance Sitting-balance support: No upper extremity supported;Feet supported Sitting balance-Leahy Scale: Good     Standing balance support: Bilateral upper extremity supported;During functional activity Standing balance-Leahy Scale: Poor Standing balance comment: relies on BUE support and external support with limited tolerance due to NWB status,                            Cognition Arousal/Alertness: Awake/alert Behavior During Therapy: WFL for tasks assessed/performed Overall Cognitive Status: Within Functional Limits for tasks assessed                                        Exercises      General Comments General comments (skin integrity, edema, etc.): continues to require cues to maintain NWB  L LE, inconsistent with maintaining NWB status during dynamic transfers      Pertinent Vitals/Pain Pain Assessment: Faces Faces Pain Scale: Hurts a little bit Pain Location: L ankle Pain Descriptors / Indicators: Operative site guarding Pain Intervention(s): Limited activity within patient's tolerance;Monitored during session    Home Living                      Prior Function            PT Goals (current goals can now be found in the care plan section) Acute Rehab PT Goals Patient Stated Goal: to get  home and walk independently PT Goal Formulation: With patient Time For Goal Achievement: 06/26/21 Potential to Achieve Goals: Good Progress towards PT goals: Progressing toward goals (slowly)    Frequency    Min 3X/week      PT Plan Current plan remains appropriate    Co-evaluation              AM-PAC PT "6 Clicks" Mobility   Outcome Measure  Help needed turning from your back to your side while in a flat bed without using bedrails?: None Help needed moving from lying on your back to sitting on the side of a flat bed without using bedrails?: A Little Help needed moving to and from a bed to a chair (including a wheelchair)?: A Little Help needed standing up from a chair using your arms (e.g., wheelchair or bedside chair)?: A Little Help needed to walk in hospital room?: Total Help needed climbing 3-5 steps with a railing? : Total 6 Click Score: 15    End of Session Equipment Utilized During Treatment: Gait belt Activity Tolerance: Patient tolerated treatment well Patient left: with call bell/phone within reach;in chair;with chair alarm set Nurse Communication: Mobility status PT Visit Diagnosis: Muscle weakness (generalized) (M62.81);Pain;Difficulty in walking, not elsewhere classified (R26.2) Pain - Right/Left: Left Pain - part of body: Ankle and joints of foot     Time: 6314-9702 PT Time Calculation (min) (ACUTE ONLY): 23 min  Charges:  $Therapeutic Activity: 23-37 mins                    Windell Norfolk, DPT, PN2   Supplemental Physical Therapist [place]    Pager [phone] Acute Rehab Office [phone]

## 2021-06-18 LAB — GLUCOSE, CAPILLARY
Glucose-Capillary: 90 mg/dL (ref 70–99)
Glucose-Capillary: 184 mg/dL — ABNORMAL HIGH (ref 70–99)
Glucose-Capillary: 178 mg/dL — ABNORMAL HIGH (ref 70–99)
Glucose-Capillary: 218 mg/dL — ABNORMAL HIGH (ref 70–99)

## 2021-06-18 LAB — CREATININE, SERUM
Creatinine, Ser: 1.12 mg/dL — ABNORMAL HIGH (ref 0.44–1.00)
GFR, Estimated: 57 mL/min — ABNORMAL LOW (ref >60)

## 2021-06-18 NOTE — Progress Notes (Signed)
Physical Therapy Treatment Patient Details Name: [name] MRN: [id number] DOB: [birth year] Today's Date: 06/18/2021   History of Present Illness 60 yo female with previous bimalleolar fracture of L ankle from last year was readmitted when the ORIF declined and had become a Charcot arthropathy with hardware failure in her foot. Pt s/p left ankle fusion and Removal of hardware. PMHx:  SIRS, peripheral neuropathy, diabetic foot ulcer, CKD, HA, DM, COPD, anxiety and depression, anemia    PT Comments    Patient received in bed, she is mod independent with bed mobility. Performed lateral scoot from bed to recliner with min assist. Performed sit to stand transfers with RW and min assist +2. Difficulty maintaining NWB on left with sit to stand transfers and standing. Patient will continue to benefit from skilled PT while here to improve strength and functional independence.     Recommendations for follow up therapy are one component of a multi-disciplinary discharge planning process, led by the attending physician.  Recommendations may be updated based on patient status, additional functional criteria and insurance authorization.  Follow Up Recommendations  SNF     Equipment Recommendations  Other (comment) (TBD)    Recommendations for Other Services       Precautions / Restrictions Precautions Precautions: Fall Restrictions Weight Bearing Restrictions: Yes LLE Weight Bearing: Non weight bearing Other Position/Activity Restrictions: Talked at length about weight bearing status. Pt had one surgery fail and now has this surgery. Pt feels it is "ok" to put a little weight on it.  Talked to her about not putting any weight on it to allow it to heal as it should.     Mobility  Bed Mobility Overal bed mobility: Needs Assistance Bed Mobility: Supine to Sit     Supine to sit: Supervision;HOB elevated     General bed mobility comments: no assist required, using rails and HOB up     Transfers Overall transfer level: Needs assistance Equipment used: Rolling walker (2 wheeled) Transfers: Sit to/from Stand;Lateral/Scoot Transfers Sit to Stand: +2 physical assistance;Min assist Stand pivot transfers: Min assist;+2 safety/equipment      Lateral/Scoot Transfers: +2 physical assistance;Min assist General transfer comment: Performed 2 reps of sit to stand from recliner with min assist and RW. Patient able to perform lateral scoot from bed to recliner with min guard.  Ambulation/Gait             General Gait Details: unable to while safely maintaining NWB L LE   Stairs             Wheelchair Mobility    Modified Rankin (Stroke Patients Only)       Balance Overall balance assessment: Needs assistance Sitting-balance support: Feet supported Sitting balance-Leahy Scale: Good     Standing balance support: Bilateral upper extremity supported;During functional activity Standing balance-Leahy Scale: Poor Standing balance comment: Pt must have walker to stand at this time and is heavily reliant on it.                            Cognition Arousal/Alertness: Awake/alert Behavior During Therapy: WFL for tasks assessed/performed Overall Cognitive Status: Within Functional Limits for tasks assessed                                        Exercises      General Comments General comments (skin  integrity, edema, etc.): Pt making improvements with independence with transfers and adls.      Pertinent Vitals/Pain Pain Assessment: 0-10 Pain Score: 2  Faces Pain Scale: Hurts a little bit Pain Location: L ankle Pain Descriptors / Indicators: Operative site guarding;Discomfort Pain Intervention(s): Monitored during session;Repositioned    Home Living                      Prior Function            PT Goals (current goals can now be found in the care plan section) Acute Rehab PT Goals Patient Stated Goal: to get  home and walk independently PT Goal Formulation: With patient Time For Goal Achievement: 06/26/21 Potential to Achieve Goals: Fair Progress towards PT goals: Progressing toward goals    Frequency    Min 3X/week      PT Plan Current plan remains appropriate    Co-evaluation PT/OT/SLP Co-Evaluation/Treatment: Yes Reason for Co-Treatment: For patient/therapist safety;To address functional/ADL transfers PT goals addressed during session: Mobility/safety with mobility OT goals addressed during session: ADL's and self-care      AM-PAC PT "6 Clicks" Mobility   Outcome Measure  Help needed turning from your back to your side while in a flat bed without using bedrails?: None Help needed moving from lying on your back to sitting on the side of a flat bed without using bedrails?: A Little Help needed moving to and from a bed to a chair (including a wheelchair)?: A Little Help needed standing up from a chair using your arms (e.g., wheelchair or bedside chair)?: A Little Help needed to walk in hospital room?: Total Help needed climbing 3-5 steps with a railing? : Total 6 Click Score: 15    End of Session Equipment Utilized During Treatment: Gait belt Activity Tolerance: Patient tolerated treatment well Patient left: in chair;with call bell/phone within reach;with chair alarm set Nurse Communication: Mobility status PT Visit Diagnosis: Muscle weakness (generalized) (M62.81);Pain;Other abnormalities of gait and mobility (R26.89);Unsteadiness on feet (R26.81) Pain - Right/Left: Left Pain - part of body: Ankle and joints of foot     Time: 1010-1040 PT Time Calculation (min) (ACUTE ONLY): 30 min  Charges:  $Therapeutic Activity: 8-22 mins                     [employer], PT, GCS 06/18/21,12:32 PM

## 2021-06-18 NOTE — Progress Notes (Addendum)
Doing fairly well today. Still having a lot of pain in the left ankle.  Having a difficult time with toileting, as the bedpan is painful and getting to the bedside commode has been extremely difficult due to limited mobility. No new complaints. Continues working with therapies, making slow improvements. Patient has received insurance authorization for SNF, case manager has two possible available facilities which she is checking with. We will continue to follow up on this and hopefully be able to progress with discharge planning over the next few days.   [name] A. Ladonna Sn[facility] [phone] (office) [email or web address]

## 2021-06-18 NOTE — Progress Notes (Signed)
Occupational Therapy Treatment Patient Details Name: [name] MRN: [id number] DOB: 04-23-1961 Today's Date: 06/18/2021   History of present illness 59 yo female with previous bimalleolar fracture of L ankle from last year was readmitted when the ORIF declined and had become a Charcot arthropathy with hardware failure in her foot. Pt s/p left ankle fusion and Removal of hardware. PMHx:  SIRS, peripheral neuropathy, diabetic foot ulcer, CKD, HA, DM, COPD, anxiety and depression, anemia   OT comments  Pt making slow but steady progress with all adls. Focused session on safe and more independent transfers. Pt able to scoot to chair with armrail down with someone holding chair only.  Pt could use this method with drop arm BSC or a w/c with side down to increase independence with transfers. Will continue to address stand pivot transfers to commode as well and work on standing tasks with the ability to let go of the walker.   Recommendations for follow up therapy are one component of a multi-disciplinary discharge planning process, led by the attending physician.  Recommendations may be updated based on patient status, additional functional criteria and insurance authorization.    Follow Up Recommendations  SNF    Equipment Recommendations       Recommendations for Other Services      Precautions / Restrictions Precautions Precautions: Fall Restrictions Weight Bearing Restrictions: Yes LLE Weight Bearing: Non weight bearing Other Position/Activity Restrictions: Talked at length about weight bearing status. Pt had one surgery fail and now has this surgery. Pt feels it is "ok" to put a little weight on it.  Talked to her about not putting any weight on it to allow it to heal as it should.       Mobility Bed Mobility Overal bed mobility: Needs Assistance Bed Mobility: Supine to Sit     Supine to sit: Supervision;HOB elevated     General bed mobility comments: no assist required,  using rails and HOB up    Transfers Overall transfer level: Needs assistance Equipment used: Rolling walker (2 wheeled) Transfers: Sit to/from Stand Sit to Stand: Min assist;+2 physical assistance Stand pivot transfers: Min assist;+2 safety/equipment       General transfer comment: Pt doing well with sit to stand. Will continue to work on pivoting in standing.  In the mean time, to increase pt's independence, taught pt how to scoot transfer from bed to chair with armrail down.  Pt could use this method if transferring to [facility] or w/c if it has a drop arm to allow more safety and independence. Will cont to also focus on stand pivot transfers.    Balance Overall balance assessment: Needs assistance Sitting-balance support: No upper extremity supported;Feet supported Sitting balance-Leahy Scale: Good     Standing balance support: Bilateral upper extremity supported;During functional activity Standing balance-Leahy Scale: Poor Standing balance comment: Pt must have walker to stand at this time and is heavily reliant on it.                           ADL either performed or assessed with clinical judgement   ADL Overall ADL's : Needs assistance/impaired Eating/Feeding: Set up;Sitting   Grooming: Set up;Sitting   Upper Body Bathing: Set up;Sitting   Lower Body Bathing: Moderate assistance;Sit to/from stand;Cueing for compensatory techniques Lower Body Bathing Details (indicate cue type and reason): Pt is more independent sitting with lateral leans to wash bottom but did practice standing today with attempts to  let go of walker to wash back side. Pt currently can hold to walker while someone else washes bottom but is tolerating standing for longer periods of time.     Lower Body Dressing: Moderate assistance;Sit to/from stand;Sitting/lateral leans Lower Body Dressing Details (indicate cue type and reason): assist for R sock (typically uses AE at home), sit to stand with min  assist but relies on BUE support (educated on lateral leans but difficulty due to body habitus) Toilet Transfer: Moderate assistance;Stand-pivot;BSC;RW Toilet Transfer Details (indicate cue type and reason): Now has bariatric commode. Requires encouragement to transfer to commode and not use purwick. Toileting- Clothing Manipulation and Hygiene: Moderate assistance;Sit to/from stand;Cueing for compensatory techniques Toileting - Clothing Manipulation Details (indicate cue type and reason): Pt cannot let go to manage clothing at this time in standing but at bed level can lean side to side to manage clothes.     Functional mobility during ADLs: Minimal assistance General ADL Comments: Pt transferred sit to stand many times with platform and without.  Feel pt does not need the platform on this walker at this time.  Pt able to transfer easier without it and maintains NWB RLE better without it.     Vision   Vision Assessment?: No apparent visual deficits   Perception     Praxis      Cognition Arousal/Alertness: Awake/alert Behavior During Therapy: WFL for tasks assessed/performed Overall Cognitive Status: Within Functional Limits for tasks assessed                                          Exercises     Shoulder Instructions       General Comments Pt making improvements with independence with transfers and adls.    Pertinent Vitals/ Pain       Pain Assessment: 0-10 Pain Score: 2  Pain Location: L ankle Pain Descriptors / Indicators: Operative site guarding Pain Intervention(s): Monitored during session  Home Living                                          Prior Functioning/Environment              Frequency  Min 2X/week        Progress Toward Goals  OT Goals(current goals can now be found in the care plan section)  Progress towards OT goals: Progressing toward goals  Acute Rehab OT Goals Patient Stated Goal: to get home and  walk independently OT Goal Formulation: With patient Time For Goal Achievement: 06/26/21 Potential to Achieve Goals: Good ADL Goals Pt Will Perform Lower Body Bathing: with min guard assist;sit to/from stand;with adaptive equipment Pt Will Perform Lower Body Dressing: with min guard assist;sit to/from stand Pt Will Transfer to Toilet: with min guard assist;ambulating Pt Will Perform Toileting - Clothing Manipulation and hygiene: with min guard assist;sit to/from stand;sitting/lateral leans  Plan Discharge plan remains appropriate;Frequency remains appropriate    Co-evaluation    PT/OT/SLP Co-Evaluation/Treatment: Yes Reason for Co-Treatment: To address functional/ADL transfers PT goals addressed during session: Mobility/safety with mobility OT goals addressed during session: ADL's and self-care      AM-PAC OT "6 Clicks" Daily Activity     Outcome Measure   Help from another person eating meals?: None Help from another person taking care  of personal grooming?: None Help from another person toileting, which includes using toliet, bedpan, or urinal?: A Lot Help from another person bathing (including washing, rinsing, drying)?: A Little Help from another person to put on and taking off regular upper body clothing?: A Little Help from another person to put on and taking off regular lower body clothing?: A Lot 6 Click Score: 18    End of Session    OT Visit Diagnosis: Unsteadiness on feet (R26.81);Muscle weakness (generalized) (M62.81);Pain Pain - Right/Left: Left Pain - part of body: Ankle and joints of foot   Activity Tolerance Patient tolerated treatment well   Patient Left in chair;with call bell/phone within reach   Nurse Communication Mobility status        Time: 1683-7290 OT Time Calculation (min): 24 min  Charges: OT General Charges $OT Visit: 1 Visit OT Treatments $Self Care/Home Management : 8-22 mins   Hope Budds 06/18/2021, 11:57 AM

## 2021-06-18 NOTE — TOC Progression Note (Signed)
Transition of Care [facility]) - Initial/Assessment Note    Patient Details  Name: [name] MRN: [id number] Date of Birth: [birth year]  [facility]) CM/SW Contact:    Milinda Antis, [place] Phone Number: 06/18/2021, 4:29 PM  Clinical Narrative:                 CSW met with patient at bedside to present bed offers.  The patient prefers [facility] or [facility].  Expected Discharge Plan: Skilled Nursing Facility Barriers to Discharge: Continued Medical Work up   Patient Goals and CMS Choice Patient states their goals for this hospitalization and ongoing recovery are:: to go to SNF CMS Medicare.gov Compare Post Acute Care list provided to:: Patient Choice offered to / list presented to : Patient  Expected Discharge Plan and Services Expected Discharge Plan: [place] In-house Referral: Clinical Social Work     Living arrangements for the past 2 months: Mobile Home                                      Prior Living Arrangements/Services Living arrangements for the past 2 months: Mobile Home Lives with:: Self Patient language and need for interpreter reviewed:: Yes Do you feel safe going back to the place where you live?: No   SNF  Need for Family Participation in Patient Care: Yes (Comment) Care giver support system in place?: Yes (comment)   Criminal Activity/Legal Involvement Pertinent to Current Situation/Hospitalization: No - Comment as needed  Activities of Daily Living Home Assistive Devices/Equipment: Walker (specify type) (front wheel) ADL Screening (condition at time of admission) Patient's cognitive ability adequate to safely complete daily activities?: Yes Is the patient deaf or have difficulty hearing?: No Does the patient have difficulty seeing, even when wearing glasses/contacts?: No Does the patient have difficulty concentrating, remembering, or making decisions?: No Patient able to express need for assistance with  ADLs?: Yes Does the patient have difficulty dressing or bathing?: No Independently performs ADLs?: Yes (appropriate for developmental age) Does the patient have difficulty walking or climbing stairs?: Yes Weakness of Legs: Left Weakness of Arms/Hands: None  Permission Sought/Granted Permission sought to share information with : Case Manager, Family Supports, Customer service manager Permission granted to share information with : Yes, Verbal Permission Granted  Share Information with NAME: Jarrett Soho  Permission granted to share info w AGENCY: SNF  Permission granted to share info w Relationship: daughter  Permission granted to share info w Contact Information: Jarrett Soho [phone]  Emotional Assessment   Attitude/Demeanor/Rapport: Gracious Affect (typically observed): Calm Orientation: : Oriented to Self, Oriented to Place, Oriented to  Time, Oriented to Situation Alcohol / Substance Use: Not Applicable Psych Involvement: No (comment)  Admission diagnosis:  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.072] Patient Active Problem List   Diagnosis Date Noted   Arthritis of left ankle 06/11/2021   Closed bimalleolar fracture of left ankle 05/06/2021   Closed left ankle fracture 05/28/2020   Moderate protein malnutrition ([place])    Adult BMI 50.0-59.9 kg/sq m ([place])    Normocytic anemia 04/21/2018   Diabetic foot infection ([place]) 04/21/2018   Mild renal insufficiency 04/21/2018   Diabetic infection of right foot [facility])    Preventative health care 07/29/8526   DYSMETABOLIC SYNDROME 78/24/2353   ULNAR NEUROPATHY, BILATERAL 10/01/2010   Insulin-requiring or dependent type II diabetes mellitus ([place]) 09/11/2010   HYPERLIPIDEMIA 09/11/2010   Morbid (severe) obesity due  to excess calories ([place]) 09/11/2010   TOBACCO USE 09/11/2010   Depression with anxiety 09/11/2010   Essential hypertension 09/11/2010   GERD 09/11/2010   PEPTIC ULCER DISEASE 09/11/2010   COLONIC POLYPS, HX OF 09/11/2010    Lumbosacral spondylosis 02/27/2010   PCP:  Helen Hashimoto., MD Pharmacy:   [facility] [place], [place] [address] Phone: [phone] Fax: [phone]  [id number] [place] [facility] [address]Phone: [phone]: [phone]    Social Determinants of Health (SDOH) Interventions    Readmission Risk Interventions No flowsheet data found.

## 2021-06-19 LAB — GLUCOSE, CAPILLARY
Glucose-Capillary: 116 mg/dL — ABNORMAL HIGH (ref 70–99)
Glucose-Capillary: 180 mg/dL — ABNORMAL HIGH (ref 70–99)
Glucose-Capillary: 183 mg/dL — ABNORMAL HIGH (ref 70–99)
Glucose-Capillary: 224 mg/dL — ABNORMAL HIGH (ref 70–99)

## 2021-06-19 MED ORDER — BISACODYL 10 MG RE SUPP: 10.0000 mg | Freq: Every day | RECTAL | 0 refills | Status: AC | PRN

## 2021-06-19 MED ORDER — ENOXAPARIN SODIUM 80 MG/0.8ML IJ SOSY: 0.5000 mg/kg | PREFILLED_SYRINGE | INTRAMUSCULAR | 0 refills | Status: DC

## 2021-06-19 MED ORDER — METHOCARBAMOL 750 MG PO TABS: 750.0000 mg | ORAL_TABLET | Freq: Four times a day (QID) | ORAL | 0 refills | Status: AC

## 2021-06-19 MED ORDER — OXYCODONE-ACETAMINOPHEN 10-325 MG PO TABS: 1.0000 | ORAL_TABLET | ORAL | 0 refills | Status: DC | PRN

## 2021-06-19 MED ORDER — OXYCODONE HCL 5 MG PO TABS: 5.0000 mg | ORAL_TABLET | Freq: Four times a day (QID) | ORAL | 0 refills | Status: DC | PRN

## 2021-06-19 MED ORDER — POLYETHYLENE GLYCOL 3350 17 G PO PACK: 17.0000 g | PACK | Freq: Every day | ORAL | 0 refills | Status: AC | PRN

## 2021-06-19 NOTE — TOC Progression Note (Addendum)
Transition of Care [facility]) - Initial/Assessment Note    Patient Details  Name: [name] MRN: [id number] Date of Birth: 03-Sep-1961  Transition of Care [facility]) CM/SW Contact:    Ralene Bathe, LCSWA Phone Number: 06/19/2021, 8:35 AM  Clinical Narrative:                 08:35-  CSW spoke with Selena Batten in admissions at [facility].  Kim informed CSW that they can accept the patient tomorrow if insurance Berkley Harvey is received.   CSW requested that [facility] CMA's  request insurance authorization.  PASSR is still pending.  CSW contacted [employer] [facility] and is awaiting a response.  Expected Discharge Plan: Skilled Nursing Facility Barriers to Discharge: Continued Medical Work up   Patient Goals and CMS Choice Patient states their goals for this hospitalization and ongoing recovery are:: to go to SNF CMS Medicare.gov Compare Post Acute Care list provided to:: Patient Choice offered to / list presented to : Patient  Expected Discharge Plan and Services Expected Discharge Plan: Skilled Nursing Facility In-house Referral: Clinical Social Work     Living arrangements for the past 2 months: Mobile Home                                      Prior Living Arrangements/Services Living arrangements for the past 2 months: Mobile Home Lives with:: Self Patient language and need for interpreter reviewed:: Yes Do you feel safe going back to the place where you live?: No   SNF  Need for Family Participation in Patient Care: Yes (Comment) Care giver support system in place?: Yes (comment)   Criminal Activity/Legal Involvement Pertinent to Current Situation/Hospitalization: No - Comment as needed  Activities of Daily Living Home Assistive Devices/Equipment: Walker (specify type) (front wheel) ADL Screening (condition at time of admission) Patient's cognitive ability adequate to safely complete daily activities?: Yes Is the patient deaf or have difficulty hearing?: No Does the patient have  difficulty seeing, even when wearing glasses/contacts?: No Does the patient have difficulty concentrating, remembering, or making decisions?: No Patient able to express need for assistance with ADLs?: Yes Does the patient have difficulty dressing or bathing?: No Independently performs ADLs?: Yes (appropriate for developmental age) Does the patient have difficulty walking or climbing stairs?: Yes Weakness of Legs: Left Weakness of Arms/Hands: None  Permission Sought/Granted Permission sought to share information with : Case Manager, Family Supports, Magazine features editor Permission granted to share information with : Yes, Verbal Permission Granted  Share Information with NAME: Dahlia Client  Permission granted to share info w AGENCY: SNF  Permission granted to share info w Relationship: daughter  Permission granted to share info w Contact Information: Dahlia Client [phone]  Emotional Assessment   Attitude/Demeanor/Rapport: Gracious Affect (typically observed): Calm Orientation: : Oriented to Self, Oriented to Place, Oriented to  Time, Oriented to Situation Alcohol / Substance Use: Not Applicable Psych Involvement: No (comment)  Admission diagnosis:  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.072] Patient Active Problem List   Diagnosis Date Noted   Arthritis of left ankle 06/11/2021   Closed bimalleolar fracture of left ankle 05/06/2021   Closed left ankle fracture 05/28/2020   Moderate protein malnutrition (HCC)    Adult BMI 50.0-59.9 kg/sq m (HCC)    Normocytic anemia 04/21/2018   Diabetic foot infection (HCC) 04/21/2018   Mild renal insufficiency 04/21/2018   Diabetic infection of right foot (HCC)  Preventative health care 04/14/2011   DYSMETABOLIC SYNDROME 12/08/2010   ULNAR NEUROPATHY, BILATERAL 10/01/2010   Insulin-requiring or dependent type II diabetes mellitus (HCC) 09/11/2010   HYPERLIPIDEMIA 09/11/2010   Morbid (severe) obesity due to excess calories (HCC) 09/11/2010    TOBACCO USE 09/11/2010   Depression with anxiety 09/11/2010   Essential hypertension 09/11/2010   GERD 09/11/2010   PEPTIC ULCER DISEASE 09/11/2010   COLONIC POLYPS, HX OF 09/11/2010   Lumbosacral spondylosis 02/27/2010   PCP:  Wilmer Floor., MD Pharmacy:   [facility] [phone] Rosalita Levan, [place] - [address] AT [facility] [address] [place] [address] Phone: [phone] Fax: [phone]  [id number] [place], [place] - [address] [facility] [address] Phone: [phone] Fax: [phone]     Social Determinants of Health (SDOH) Interventions    Readmission Risk Interventions No flowsheet data found.

## 2021-06-19 NOTE — Discharge Instructions (Signed)
Truitt Merle, MD U[place]ses Southward PA-C [facility] [address] [phone] Jani Files)   [phone] (fax)                                  POST-OPERATIVE INSTRUCTIONS     WEIGHT BEARING STATUS: non-weightbearing left leg  RANGE OF MOTION/ACTIVITY: Ok for knee motion as tolerated. Do not remove splint  WOUND CARE Please keep splint clean dry and intact until follow-up. If your splint gets wet for any reason please contact the office immediately.  Do not stick anything down your splint such as pencils, momey, hangers to try and scratch yourself.  If you feel itchy take Benadryl as prescribed on the bottle for itching You may shower on Post-Op Day #2.  You must keep splint dry during this process and may find that a plastic bag taped around the extremity or alternatively a towel based bath may be a better option.   If you get your splint wet or if it is damaged please contact our clinic.  EXERCISES Due to your splint being in place you will not be able to bear weight through your extremity.   DO NOT PUT ANY WEIGHT ON YOUR OPERATIVE LEG Please use crutches or a walker to avoid weight bearing.   DVT/PE prophylaxis: Lovenox  DIET: As you were eating previously.  Can use over the counter stool softeners and bowel preparations, such as Miralax, to help with bowel movements.  Narcotics can be constipating.  Be sure to drink plenty of fluids  REGIONAL ANESTHESIA (NERVE BLOCKS) The anesthesia team may have performed a nerve block for you if safe in the setting of your care.  This is a great tool used to minimize pain.  Typically the block may start wearing off overnight but the long acting medicine may last for 3-4 days.  The nerve block wearing off can be a challenging period but please utilize your as needed pain medications to try and manage this period.    POST-OP MEDICATIONS- Multimodal approach to pain control  In general your pain will be controlled with a  combination of substances.  Prescriptions unless otherwise discussed are electronically sent to your pharmacy.  This is a carefully made plan we use to minimize narcotic use.     - Meloxicam OR Celebrex - Anti-inflammatory medication taken on a scheduled basis  - Acetaminophen - Non-narcotic pain medicine taken on a scheduled basis   - Oxycodone - This is a strong narcotic, to be used only on an "as needed" basis for pain.  -  Lovenox - This medicine is used to minimize the risk of blood clots after surgery.          FOLLOW-UP If you develop a Fever (>101.5), Redness or Drainage from the surgical incision site, please call our office to arrange for an evaluation. Please call the office to schedule a follow-up appointment for your incision check if you do not already have one, 7-10 days post-operatively.   VISIT OUR WEBSITE FOR ADDITIONAL INFORMATION: [email or web address]   HELPFUL INFORMATION  If you had a block, it will wear off between 8-24 hrs postop typically.  This is period when your pain may go from nearly zero to the pain you would have had postop without the block.  This is an abrupt transition but nothing dangerous is happening.  You may take an extra dose of narcotic when this happens.  You should wean off your narcotic medicines as soon as you are able.  Most patients will be off or using minimal narcotics before their first postop appointment.   We suggest you use the pain medication the first night prior to going to bed, in order to ease any pain when the anesthesia wears off. You should avoid taking pain medications on an empty stomach as it will make you nauseous.  Do not drink alcoholic beverages or take illicit drugs when taking pain medications.  In most states it is against the law to drive while you are in a splint or sling.  And certainly against the law to drive while taking narcotics.  You may return to work/school in the next couple of days when you feel up to it.    Pain medication may make you constipated.  Below are a few solutions to try in this order: Decrease the amount of pain medication if you aren't having pain. Drink lots of decaffeinated fluids. Drink prune juice and/or each dried prunes  If the first 3 don't work start with additional solutions Take Colace - an over-the-counter stool softener Take Senokot - an over-the-counter laxative Take Miralax - a stronger over-the-counter laxative

## 2021-06-19 NOTE — Progress Notes (Signed)
No acute changes. Patient remains medically stable for discharge. CM has provided her with SNF bed offers, she prefers [facility] or [facility]. Hopefully able to discharge in next 24-48 hours.    [name] A. Ladonna Snide [facility] [phone] (office) [email or web address]

## 2021-06-20 LAB — GLUCOSE, CAPILLARY
Glucose-Capillary: 111 mg/dL — ABNORMAL HIGH (ref 70–99)
Glucose-Capillary: 135 mg/dL — ABNORMAL HIGH (ref 70–99)
Glucose-Capillary: 137 mg/dL — ABNORMAL HIGH (ref 70–99)
Glucose-Capillary: 170 mg/dL — ABNORMAL HIGH (ref 70–99)

## 2021-06-20 LAB — RESP PANEL BY RT-PCR (FLU A&B, COVID) ARPGX2
Influenza A by PCR: NEGATIVE
Influenza B by PCR: NEGATIVE
SARS Coronavirus 2 by RT PCR: NEGATIVE

## 2021-06-20 NOTE — Plan of Care (Signed)
  Problem: Activity: Goal: Risk for activity intolerance will decrease Outcome: Progressing   Problem: Pain Managment: Goal: General experience of comfort will improve Outcome: Progressing   Problem: Safety: Goal: Ability to remain free from injury will improve Outcome: Progressing   

## 2021-06-20 NOTE — Progress Notes (Signed)
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 Pt s/p left ankle fusion and Removal of hardware. PMHx:  SIRS, peripheral neuropathy, diabetic foot ulcer, CKD, HA, DM, COPD, anxiety and depression, anemia    PT Comments    Patient received in bed, pleasant and cooperative requesting to get OOB. Able to transfer with min guard-MinAx2, however continues to have quite a bit of difficulty in managing NWB L LE despite cues from PT. Tends to hold L foot out when standing and can accidentally put weight on it this way- so had her practice standing in front of the recliner using her hamstring to hold it back behind her, not able to tolerate long due to hamstring weakness. Left up in recliner with all needs met,  chair alarm active. Might do best sticking to lateral scoots to maintain integrity of WB precautions.   Recommendations for follow up therapy are one component of a multi-disciplinary discharge planning process, led by the attending physician.  Recommendations may be updated based on patient status, additional functional criteria and insurance authorization.  Follow Up Recommendations  SNF     Equipment Recommendations  None recommended by PT (has been home with equipment for a year)    Recommendations for Other Services       Precautions / Restrictions Precautions Precautions: Fall Restrictions Weight Bearing Restrictions: Yes LLE Weight Bearing: Non weight bearing     Mobility  Bed Mobility Overal bed mobility: Needs Assistance Bed Mobility: Supine to Sit     Supine to sit: Supervision;HOB elevated     General bed mobility comments: no assist required, using rails and HOB up     Transfers Overall transfer level: Needs assistance Equipment used: Rolling walker (2 wheeled) Transfers: Sit to/from Stand Sit to Stand: Min guard;+2 physical assistance;From elevated surface Stand pivot transfers: Min assist;+2 physical assistance       General transfer comment: able to boost to standing with min guard +2 for safety and mod cues for maintaining NWB L LE; needed MinAx2 for balance to pivot to recliner, continues to have difficulty maintaining NWB  L LE with dynamic transfers  Ambulation/Gait             General Gait Details: unable to while safely maintaining NWB L LE   Stairs             Wheelchair Mobility    Modified Rankin (Stroke Patients Only)       Balance Overall balance assessment: Needs assistance Sitting-balance support: Feet supported Sitting balance-Leahy Scale: Good     Standing balance support: Bilateral upper extremity supported;During functional activity Standing balance-Leahy Scale: Poor Standing balance comment: Pt must have walker to stand at this time and is heavily reliant on it.                            Cognition Arousal/Alertness: Awake/alert Behavior During Therapy: WFL for tasks assessed/performed Overall Cognitive Status: Within Functional Limits for tasks assessed                                        Exercises  General Comments        Pertinent Vitals/Pain Pain Assessment: Faces Faces Pain Scale: Hurts a little bit Pain Location: L ankle Pain Descriptors / Indicators: Operative site guarding;Discomfort Pain Intervention(s): Limited activity within patient's tolerance;Repositioned    Home Living                      Prior Function            PT Goals (current goals can now be found in the care plan section) Acute Rehab PT Goals Patient Stated Goal: to get home and walk independently PT Goal Formulation: With patient Time For Goal Achievement:  06/26/21 Potential to Achieve Goals: Fair Progress towards PT goals: Progressing toward goals    Frequency    Min 3X/week      PT Plan Current plan remains appropriate    Co-evaluation              AM-PAC PT "6 Clicks" Mobility   Outcome Measure  Help needed turning from your back to your side while in a flat bed without using bedrails?: None Help needed moving from lying on your back to sitting on the side of a flat bed without using bedrails?: A Little Help needed moving to and from a bed to a chair (including a wheelchair)?: A Little Help needed standing up from a chair using your arms (e.g., wheelchair or bedside chair)?: A Little Help needed to walk in hospital room?: Total Help needed climbing 3-5 steps with a railing? : Total 6 Click Score: 15    End of Session Equipment Utilized During Treatment: Gait belt Activity Tolerance: Patient tolerated treatment well Patient left: in chair;with call bell/phone within reach;with chair alarm set Nurse Communication: Mobility status PT Visit Diagnosis: Muscle weakness (generalized) (M62.81);Pain;Other abnormalities of gait and mobility (R26.89);Unsteadiness on feet (R26.81) Pain - Right/Left: Left Pain - part of body: Ankle and joints of foot     Time: 7076-1518 PT Time Calculation (min) (ACUTE ONLY): 13 min  Charges:  $Therapeutic Activity: 8-22 mins                    Windell Norfolk, DPT, PN2   Supplemental Physical Therapist [place]    Pager [phone] Acute Rehab Office [phone]

## 2021-06-20 NOTE — Plan of Care (Signed)

## 2021-06-20 NOTE — TOC Progression Note (Signed)
Transition of Care [facility]) - Initial/Assessment Note    Patient Details  Name: [name] MRN: [id number] Date of Birth: 12-10-1960  [facility]) CM/SW Contact:    Ralene Bathe, LCSWA Phone Number: 06/20/2021, 5:24 PM  Clinical Narrative:                 Insurance authorization received.  Patient is approved from 9/16 - 9/20, next review date is 9/20 .plan auth  ID #[id number], Vesta Mixer ID #[id number].  CSW called PASSR to follow up and inquire about when the PASSR number will be received as the patient cannot go to the facility without this number.  CSW was informed that due to the patient's mental health diagnosis, there has to be a representative from [employer] to come out and speak with the patient  before she can receive a PASSR.  The representative reported that they have 3 days to complete this visit and they plan to come to see the patient and assess on Sunday, 06/22/2021.  TOC Barrier to d/c= PASSR number  Expected Discharge Plan: Skilled Nursing Facility Barriers to Discharge: Continued Medical Work up   Patient Goals and CMS Choice Patient states their goals for this hospitalization and ongoing recovery are:: to go to SNF CMS Medicare.gov Compare Post Acute Care list provided to:: Patient Choice offered to / list presented to : Patient  Expected Discharge Plan and Services Expected Discharge Plan: Skilled Nursing Facility In-house Referral: Clinical Social Work     Living arrangements for the past 2 months: Mobile Home                                      Prior Living Arrangements/Services Living arrangements for the past 2 months: Mobile Home Lives with:: Self Patient language and need for interpreter reviewed:: Yes Do you feel safe going back to the place where you live?: No   SNF  Need for Family Participation in Patient Care: Yes (Comment) Care giver support system in place?: Yes (comment)   Criminal Activity/Legal Involvement Pertinent to  Current Situation/Hospitalization: No - Comment as needed  Activities of Daily Living Home Assistive Devices/Equipment: Walker (specify type) (front wheel) ADL Screening (condition at time of admission) Patient's cognitive ability adequate to safely complete daily activities?: Yes Is the patient deaf or have difficulty hearing?: No Does the patient have difficulty seeing, even when wearing glasses/contacts?: No Does the patient have difficulty concentrating, remembering, or making decisions?: No Patient able to express need for assistance with ADLs?: Yes Does the patient have difficulty dressing or bathing?: No Independently performs ADLs?: Yes (appropriate for developmental age) Does the patient have difficulty walking or climbing stairs?: Yes Weakness of Legs: Left Weakness of Arms/Hands: None  Permission Sought/Granted Permission sought to share information with : Case Manager, Family Supports, Oceanographer granted to share information with : Yes, Verbal Permission Granted  Share Information with NAME: Dahlia Client  Permission granted to share info w AGENCY: SNF  Permission granted to share info w Relationship: daughter  Permission granted to share info w Contact Information: Dahlia Client [phone]  Emotional Assessment   Attitude/Demeanor/Rapport: Gracious Affect (typically observed): Calm Orientation: : Oriented to Self, Oriented to Place, Oriented to  Time, Oriented to Situation Alcohol / Substance Use: Not Applicable Psych Involvement: No (comment)  Admission diagnosis:  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.072] Patient Active Problem List   Diagnosis Date Noted  Arthritis of left ankle 06/11/2021   Closed bimalleolar fracture of left ankle 05/06/2021   Closed left ankle fracture 05/28/2020   Moderate protein malnutrition (HCC)    Adult BMI 50.0-59.9 kg/sq m (HCC)    Normocytic anemia 04/21/2018   Diabetic foot infection (HCC) 04/21/2018   Mild renal  insufficiency 04/21/2018   Diabetic infection of right foot [facility])    Preventative health care 04/14/2011   DYSMETABOLIC SYNDROME 12/08/2010   ULNAR NEUROPATHY, BILATERAL 10/01/2010   Insulin-requiring or dependent type II diabetes mellitus (HCC) 09/11/2010   HYPERLIPIDEMIA 09/11/2010   Morbid (severe) obesity due to excess calories (HCC) 09/11/2010   TOBACCO USE 09/11/2010   Depression with anxiety 09/11/2010   Essential hypertension 09/11/2010   GERD 09/11/2010   PEPTIC ULCER DISEASE 09/11/2010   COLONIC POLYPS, HX OF 09/11/2010   Lumbosacral spondylosis 02/27/2010   PCP:  Wilmer Floor., MD Pharmacy:   [facility] [phone] Rosalita Levan, [place] - [address] AT [facility] OF [address] [place] [address] Phone: [phone] Fax: [phone]  [id number] [place], [place] - [address] [facility] [place] [address] Phone: [phone] Fax: [phone]     Social Determinants of Health (SDOH) Interventions    Readmission Risk Interventions No flowsheet data found.

## 2021-06-20 NOTE — Progress Notes (Signed)
No acute changes. Patient remains medically stable for discharge. Has bed offer at [facility] in [place], awaiting insurance authorization which has been a barrier to discharge. TOC following. Will continue to follow and hopefully be able to discharge over the next few days.  Ina Scrivens A. Ladonna Snide [facility] [phone] (office) [email or web address]

## 2021-06-21 LAB — GLUCOSE, CAPILLARY
Glucose-Capillary: 125 mg/dL — ABNORMAL HIGH (ref 70–99)
Glucose-Capillary: 215 mg/dL — ABNORMAL HIGH (ref 70–99)
Glucose-Capillary: 173 mg/dL — ABNORMAL HIGH (ref 70–99)
Glucose-Capillary: 243 mg/dL — ABNORMAL HIGH (ref 70–99)

## 2021-06-21 NOTE — TOC Progression Note (Signed)
Transition of Care [facility]) - Progression Note    Patient Details  Name: [name] MRN: [id number] Date of Birth: 1961-08-14  Transition of Care [facility]) CM/SW Contact  Lorri Frederick, LCSW Phone Number: 06/21/2021, 9:29 AM  Clinical Narrative:  Pt level 2 PASSR still pending in [place] and has been assigned to a face to face reviewer.      Expected Discharge Plan: Skilled Nursing Facility Barriers to Discharge: Continued Medical Work up  Expected Discharge Plan and Services Expected Discharge Plan: Skilled Nursing Facility In-house Referral: Clinical Social Work     Living arrangements for the past 2 months: Mobile Home                                       Social Determinants of Health (SDOH) Interventions    Readmission Risk Interventions No flowsheet data found.

## 2021-06-21 NOTE — Progress Notes (Signed)
Orthopaedic Trauma Progress Note  SUBJECTIVE: No changes. Still waiting insurance authorization.    OBJECTIVE:  Vitals:   06/21/21 1409 06/21/21 1633  BP: (!) 124/57 (!) 142/60  Pulse: 64 64  Resp: 16 18  Temp: 97.7 F (36.5 C)   SpO2: 95% 96%    General: Sitting up in recliner, no acute distress Respiratory: No increased work of breathing.  Left lower extremity: Well-padded, well fitting splint in place. Able to wiggle toes.  Decreased sensation to the toes, this is baseline.  Nontender above splint.  Toes warm and well-perfused  IMAGING: Stable post op imaging.   LABS:  Results for orders placed or performed during the hospital encounter of 06/11/21 (from the past 24 hour(s))  Glucose, capillary     Status: Abnormal   Collection Time: 06/20/21  9:07 PM  Result Value Ref Range   Glucose-Capillary 170 (H) 70 - 99 mg/dL  Glucose, capillary     Status: Abnormal   Collection Time: 06/21/21  8:05 AM  Result Value Ref Range   Glucose-Capillary 125 (H) 70 - 99 mg/dL  Glucose, capillary     Status: Abnormal   Collection Time: 06/21/21 11:25 AM  Result Value Ref Range   Glucose-Capillary 215 (H) 70 - 99 mg/dL  Glucose, capillary     Status: Abnormal   Collection Time: 06/21/21  3:57 PM  Result Value Ref Range   Glucose-Capillary 173 (H) 70 - 99 mg/dL    ASSESSMENT: [name] is a 60 y.o. female, 10 Days Post-Op s/p HARDWARE REMOVAL AND LEFT ANKLE FUSION  CV/Blood loss: Hgb stable. Hemodynamically stable.   PLAN: Weightbearing: NWB LLE Incisional and dressing care: Dressings left intact until follow-up and Reinforce dressings as needed  Showering: Okay shower, keep splint dry Orthopedic device(s): Splint LLE Pain management:  1. Tylenol 325-650 mg q 6 hours PRN 2. Robaxin 500 mg q 6 hours PRN 3. Oxycodone 5-10 mg q 6 hours PRN breakthrough pain 4. Percocet 10-325 mg q 4 hours PRN 5. Lyrica 225 mg TID 6. Dilaudid 0.5-1 mg q 4 hours PRN 7. Toradol 15 mg every 6 hours   VTE prophylaxis: Lovenox, SCDs ID: Ancef 2gm post op completed Foley/Lines: No foley, KVO IVFs Impediments to Fracture Healing: Vitamin D level low normal at 31 Dispo: Therapies as tolerated, PT/OT recommending SNF.  TOC aware and following, working on placement. Patient remains medically stable for discharge. Has bed offer at [facility] in [place], awaiting insurance authorization which has been a barrier to discharge. TOC following. Will continue to follow and hopefully be able to discharge over the next few days.  Follow - up plan: 2 weeks after discharge for repeat x-rays and wound check  Contact information: After hours and holidays please check [email or web address] for group call information for [facility]   Alfonse Alpers, PA-C 06/21/2021

## 2021-06-22 LAB — GLUCOSE, CAPILLARY
Glucose-Capillary: 196 mg/dL — ABNORMAL HIGH (ref 70–99)
Glucose-Capillary: 192 mg/dL — ABNORMAL HIGH (ref 70–99)
Glucose-Capillary: 182 mg/dL — ABNORMAL HIGH (ref 70–99)

## 2021-06-22 NOTE — TOC Progression Note (Signed)
Transition of Care [facility]) - Progression Note    Patient Details  Name: [name] MRN: [id number] Date of Birth: Feb 11, 1961  Transition of Care [facility]) CM/SW Contact  Lorri Frederick, LCSW Phone Number: 06/22/2021, 3:55 PM  Clinical Narrative:   Level 2 passr still pending in [place].    Expected Discharge Plan: Skilled Nursing Facility Barriers to Discharge: Continued Medical Work up  Expected Discharge Plan and Services Expected Discharge Plan: Skilled Nursing Facility In-house Referral: Clinical Social Work     Living arrangements for the past 2 months: Mobile Home                                       Social Determinants of Health (SDOH) Interventions    Readmission Risk Interventions No flowsheet data found.

## 2021-06-22 NOTE — Progress Notes (Signed)
Subjective: Patient reports pain as moderate. Controlled with medicines. Tolerating diet. Urinating. No CP, SOB.  Has worked with PT on mobilization OOB. Eager to progress in her recovery.  Objective:   VITALS:   Vitals:   06/21/21 2047 06/22/21 0429 06/22/21 0751 06/22/21 0755  BP: 129/61 (!) 121/53 (!) 115/46   Pulse: 70 64 66 71  Resp: 18 18 14 18   Temp: 98.1 F (36.7 C) 98.1 F (36.7 C) 98 F (36.7 C)   TempSrc: Oral Oral Oral   SpO2: 94% 94% 94% 97%  Weight:      Height:       CBC Latest Ref Rng & Units 06/16/2021 06/13/2021 06/12/2021  WBC 4.0 - 10.5 K/uL 8.7 9.8 12.7(H)  Hemoglobin 12.0 - 15.0 g/dL 11.3(L) 11.6(L) 12.4  Hematocrit 36.0 - 46.0 % 34.8(L) 35.3(L) 37.6  Platelets 150 - 400 K/uL 210 190 225   BMP Latest Ref Rng & Units 06/18/2021 06/16/2021 06/14/2021  Glucose 70 - 99 mg/dL - 08/14/2021) 433(I)  BUN 6 - 20 mg/dL - 18 951(O)  Creatinine 0.44 - 1.00 mg/dL 84(Z) 6.60(Y) 3.01(S)  Sodium 135 - 145 mmol/L - 137 137  Potassium 3.5 - 5.1 mmol/L - 4.4 4.3  Chloride 98 - 111 mmol/L - 101 104  CO2 22 - 32 mmol/L - 30 28  Calcium 8.9 - 10.3 mg/dL - 8.7(L) 8.5(L)   Intake/Output      09/17 0701 09/18 0700 09/18 0701 09/19 0700   Urine (mL/kg/hr) 3425 (0.9)    Stool 0    Total Output 3425    Net -3425         Urine Occurrence 2 x    Stool Occurrence 1 x       Physical Exam: General: NAD.  Laying in bed. Calm, comfortable Resp: No increased wob Cardio: regular rate and rhythm ABD soft Neurologically intact MSK Neurovascularly intact Sensation intact distally Intact pulses distally Can move toes Splint in place Incision: dressing C/D/I   Assessment: 11 Days Post-Op  S/P Procedure(s) (LRB): ANKLE FUSION (Left) by Dr. 10/19 on 06/11/21  Principal Problem:   Closed bimalleolar fracture of left ankle Active Problems:   Arthritis of left ankle   Plan: Still waiting on insurance approval  Advance diet Incentive Spirometry Elevate and Apply  ice  Weightbearing: NWB LLE Incisional and dressing care: Dressings left intact until follow-up and Reinforce dressings as needed  Showering: Okay shower, keep splint dry Orthopedic device(s): Splint LLE Pain management:  1. Tylenol 325-650 mg q 6 hours PRN 2. Robaxin 500 mg q 6 hours PRN 3. Oxycodone 5-10 mg q 6 hours PRN breakthrough pain 4. Percocet 10-325 mg q 4 hours PRN 5. Lyrica 225 mg TID 6. Dilaudid 0.5-1 mg q 4 hours PRN 7. Toradol 15 mg every 6 hours  VTE prophylaxis: Lovenox, SCDs ID: Ancef 2gm post op completed Foley/Lines: No foley, KVO IVFs Impediments to Fracture Healing: Vitamin D level low normal at 31 Dispo: Therapies as tolerated, PT/OT recommending SNF.  TOC aware and following, working on placement. Patient remains medically stable for discharge. Has bed offer at [facility] in [place], awaiting insurance authorization which has been a barrier to discharge. Will continue to follow and hopefully be able to discharge over the next few days.  Follow - up plan: 2 weeks after discharge for repeat x-rays and wound check  Contact information: After hours and holidays please check [email or web address] for group call information for [facility]    Hadja Harral  Leveda Anna, PA-C Office [phone] 06/22/2021, 11:17 AM

## 2021-06-23 LAB — GLUCOSE, CAPILLARY
Glucose-Capillary: 218 mg/dL — ABNORMAL HIGH (ref 70–99)
Glucose-Capillary: 164 mg/dL — ABNORMAL HIGH (ref 70–99)
Glucose-Capillary: 188 mg/dL — ABNORMAL HIGH (ref 70–99)
Glucose-Capillary: 186 mg/dL — ABNORMAL HIGH (ref 70–99)

## 2021-06-23 NOTE — Progress Notes (Addendum)
Patient 12 days post-op s/p hardware removal and left ankle fusion. No acute changes. Is encouraged tp make progress and get better. Patient remains medically stable for discharge. Has bed offer at [facility] in [place], awaiting insurance authorization which has been a barrier to discharge. TOC following. Will continue to follow and hopefully be able to discharge over the next few days.  If patient still in hospital on Wednesday, will plan for repeat x-rays left ankle. Will likely remove splint at that time and transition to boot.    [name] A. Ladonna Snide [facility] [phone] (office) [email or web address]

## 2021-06-23 NOTE — Progress Notes (Signed)
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 Pt s/p left ankle fusion and Removal of hardware. PMHx:  SIRS, peripheral neuropathy, diabetic foot ulcer, CKD, HA, DM, COPD, anxiety and depression, anemia   OT comments  Pt continues to progress towards OT goals, she remains limited by NWB to L LE.  Requires +2 min-mod assist for lateral scoot transfers (to ensure NWB to L LE), and min assist +2 using RW but max cueing to ensure NWB to L LE.  Toileting with mod assist in standing and supervision in sitting.  Completed chair pushups x 10.  Continue to recommend SNF at this time. Will follow.    Recommendations for follow up therapy are one component of a multi-disciplinary discharge planning process, led by the attending physician.  Recommendations may be updated based on patient status, additional functional criteria and insurance authorization.    Follow Up Recommendations  SNF    Equipment Recommendations  Other (comment) (tbd)    Recommendations for Other Services      Precautions / Restrictions Precautions Precautions: Fall Restrictions Weight Bearing Restrictions: Yes LLE Weight Bearing: Non weight bearing       Mobility Bed Mobility               General bed mobility comments: OOB in recliner    Transfers Overall transfer level: Needs assistance Equipment used: Rolling walker (2 wheeled) Transfers: Sit to/from Stand;Lateral/Scoot Transfers Sit to Stand: Min assist;+2 physical assistance;+2 safety/equipment        Lateral/Scoot Transfers: Mod assist;Min assist;+2 physical assistance;+2 safety/equipment General transfer comment: completed stand pivot using RW to [facility] with +2  assist, then stood from [facility] with eva walker (pt unable to safely progress with eva walker) therefore sat back down and utilize bari RW to transfer back to recliner.  Poor safety and endurance with maintaining NWB to L LE.  Practice lateral scoots with min-mod asssist +2 to ensure L LE NWB. requires max cueing for technqiues.  May benefit from sliding board    Balance Overall balance assessment: Needs assistance Sitting-balance support: Feet supported Sitting balance-Leahy Scale: Good     Standing balance support: Bilateral upper extremity supported;During functional activity Standing balance-Leahy Scale: Poor Standing balance comment: pt relies on BUE support                           ADL either performed or assessed with clinical judgement   ADL Overall ADL's : Needs assistance/impaired                         Toilet Transfer: Minimal assistance;+2 for physical assistance;+2 for safety/equipment;Stand-pivot;BSC Toilet Transfer Details (indicate cue type and reason): min assist +2 to transfer using RW, cueing for technique and hand placmeent and ensuring NWB L LE Toileting- Clothing Manipulation and Hygiene: Moderate assistance;Sitting/lateral lean;Sit to/from stand;+2 for physical assistance;+2 for safety/equipment Toileting - Clothing Manipulation Details (indicate cue type and reason): +2 in standing with assist to wash posteriorly; seated lateral leans for hygiene with supervision     Functional mobility during ADLs: Minimal assistance;+2 for physical assistance;+2 for safety/equipment General ADL Comments: pt remains limited by L LE NWB and increased body habitus  Vision   Vision Assessment?: No apparent visual deficits   Perception     Praxis      Cognition Arousal/Alertness: Awake/alert Behavior During Therapy: WFL for tasks assessed/performed Overall Cognitive Status: Within Functional Limits for tasks assessed                                           Exercises     Shoulder Instructions       General Comments      Pertinent Vitals/ Pain       Pain Assessment: Faces Faces Pain Scale: Hurts a little bit Pain Location: L ankle Pain Descriptors / Indicators: Operative site guarding;Discomfort Pain Intervention(s): Limited activity within patient's tolerance;Monitored during session;Repositioned  Home Living                                          Prior Functioning/Environment              Frequency  Min 2X/week        Progress Toward Goals  OT Goals(current goals can now be found in the care plan section)  Progress towards OT goals: Progressing toward goals  Acute Rehab OT Goals Patient Stated Goal: to get home and walk independently OT Goal Formulation: With patient  Plan Discharge plan remains appropriate;Frequency remains appropriate    Co-evaluation    PT/OT/SLP Co-Evaluation/Treatment: Yes Reason for Co-Treatment: For patient/therapist safety;To address functional/ADL transfers   OT goals addressed during session: ADL's and self-care      AM-PAC OT "6 Clicks" Daily Activity     Outcome Measure   Help from another person eating meals?: None Help from another person taking care of personal grooming?: None Help from another person toileting, which includes using toliet, bedpan, or urinal?: A Lot Help from another person bathing (including washing, rinsing, drying)?: A Little Help from another person to put on and taking off regular upper body clothing?: A Little Help from another person to put on and taking off regular lower body clothing?: A Lot 6 Click Score: 18    End of Session Equipment Utilized During Treatment: Rolling walker  OT Visit Diagnosis: Unsteadiness on feet (R26.81);Muscle weakness (generalized) (M62.81);Pain Pain - Right/Left: Left Pain - part of body: Ankle and joints of foot   Activity Tolerance No increased pain;Patient tolerated  treatment well   Patient Left in chair;with call bell/phone within reach   Nurse Communication Mobility status        Time: 7902-4097 OT Time Calculation (min): 25 min  Charges: OT General Charges $OT Visit: 1 Visit OT Treatments $Self Care/Home Management : 8-22 mins  Barry Brunner, OT Acute Rehabilitation Services Pager [phone] Office [phone]   [name] 06/23/2021, 2:08 PM

## 2021-06-23 NOTE — TOC Progression Note (Signed)
Transition of Care [facility]) - Initial/Assessment Note    Patient Details  Name: [name] MRN: [id number] Date of Birth: 15-Nov-1960  Transition of [facility]) CM/SW Contact:    Ralene Bathe, LCSWA Phone Number: 06/23/2021, 3:28 PM  Clinical Narrative:                 CSW contacted [employer] to inquire about how long it would take for the agency to complete the Level II passr review.  CSW was informed that the reviewer has three business days to approve.  The request for LEVEL II review was sent this weekend.  CSW contacted Kim with admissions at [facility] and provided update.  TOC barrier to d/c:  PASSR number  Expected Discharge Plan: Skilled Nursing Facility Barriers to Discharge: Continued Medical Work up   Patient Goals and CMS Choice Patient states their goals for this hospitalization and ongoing recovery are:: to go to SNF CMS Medicare.gov Compare Post Acute Care list provided to:: Patient Choice offered to / list presented to : Patient  Expected Discharge Plan and Services Expected Discharge Plan: Skilled Nursing Facility In-house Referral: Clinical Social Work     Living arrangements for the past 2 months: Mobile Home                                      Prior Living Arrangements/Services Living arrangements for the past 2 months: Mobile Home Lives with:: Self Patient language and need for interpreter reviewed:: Yes Do you feel safe going back to the place where you live?: No   SNF  Need for Family Participation in Patient Care: Yes (Comment) Care giver support system in place?: Yes (comment)   Criminal Activity/Legal Involvement Pertinent to Current Situation/Hospitalization: No - Comment as needed  Activities of Daily Living Home Assistive Devices/Equipment: Walker (specify type) (front wheel) ADL Screening (condition at time of admission) Patient's cognitive ability adequate to safely complete daily activities?: Yes Is the patient deaf or have  difficulty hearing?: No Does the patient have difficulty seeing, even when wearing glasses/contacts?: No Does the patient have difficulty concentrating, remembering, or making decisions?: No Patient able to express need for assistance with ADLs?: Yes Does the patient have difficulty dressing or bathing?: No Independently performs ADLs?: Yes (appropriate for developmental age) Does the patient have difficulty walking or climbing stairs?: Yes Weakness of Legs: Left Weakness of Arms/Hands: None  Permission Sought/Granted Permission sought to share information with : Case Manager, Family Supports, Magazine features editor Permission granted to share information with : Yes, Verbal Permission Granted  Share Information with NAME: Dahlia Client  Permission granted to share info w AGENCY: SNF  Permission granted to share info w Relationship: daughter  Permission granted to share info w Contact Information: Dahlia Client [phone]  Emotional Assessment   Attitude/Demeanor/Rapport: Gracious Affect (typically observed): Calm Orientation: : Oriented to Self, Oriented to Place, Oriented to  Time, Oriented to Situation Alcohol / Substance Use: Not Applicable Psych Involvement: No (comment)  Admission diagnosis:  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.072] Patient Active Problem List   Diagnosis Date Noted   Arthritis of left ankle 06/11/2021   Closed bimalleolar fracture of left ankle 05/06/2021   Closed left ankle fracture 05/28/2020   Moderate protein malnutrition (HCC)    Adult BMI 50.0-59.9 kg/sq m (HCC)    Normocytic anemia 04/21/2018   Diabetic foot infection (HCC) 04/21/2018   Mild renal insufficiency 04/21/2018  Diabetic infection of right foot [facility])    Preventative health care 04/14/2011   DYSMETABOLIC SYNDROME 12/08/2010   ULNAR NEUROPATHY, BILATERAL 10/01/2010   Insulin-requiring or dependent type II diabetes mellitus (HCC) 09/11/2010   HYPERLIPIDEMIA 09/11/2010   Morbid (severe) obesity  due to excess calories (HCC) 09/11/2010   TOBACCO USE 09/11/2010   Depression with anxiety 09/11/2010   Essential hypertension 09/11/2010   GERD 09/11/2010   PEPTIC ULCER DISEASE 09/11/2010   COLONIC POLYPS, HX OF 09/11/2010   Lumbosacral spondylosis 02/27/2010   PCP:  Wilmer Floor., MD Pharmacy:   [facility] [phone] Rosalita Levan, [place] - [address] AT [facility] [address] [place] [address] Phone: [phone] Fax: [phone]  [id number] - [place], [place] - [address] [facility] [place] [address] Phone: [phone] Fax: [phone]     Social Determinants of Health (SDOH) Interventions    Readmission Risk Interventions No flowsheet data found.

## 2021-06-23 NOTE — Progress Notes (Signed)
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 Pt s/p left ankle fusion and Removal of hardware. PMHx:  SIRS, peripheral neuropathy, diabetic foot ulcer, CKD, HA, DM, COPD, anxiety and depression, anemia    PT Comments    Patient received in recliner, pleasant and cooperative. Continued practicing stand pivot transfers with regular RW and bari-RW and she was unable to maintain NWB status with either device. Introduced stedy, able to maintain NWB L LE well with static standing, but did not have the strength to stabilize herself to maintain L LE NWB dynamically. Educated and enforced lateral scoots as the best and safest way for her to maintain precautions right now, but did need ModAx2 to safely perform today. Left up in recliner- will continue to follow.    Recommendations for follow up therapy are one component of a multi-disciplinary discharge planning process, led by the attending physician.  Recommendations may be updated based on patient status, additional functional criteria and insurance authorization.  Follow Up Recommendations  SNF     Equipment Recommendations  None recommended by PT (has been home with equipment for a year)    Recommendations for Other Services       Precautions / Restrictions Precautions Precautions: Fall Restrictions Weight Bearing Restrictions: Yes LLE Weight Bearing: Non weight bearing     Mobility  Bed Mobility               General bed mobility comments: OOB in recliner    Transfers Overall transfer level: Needs assistance Equipment used: Rolling walker (2 wheeled) Transfers: Sit to/from Stand;Lateral/Scoot Transfers Sit to Stand: Min assist;+2 physical  assistance;+2 safety/equipment Stand pivot transfers: Min assist;+2 physical assistance      Lateral/Scoot Transfers: Mod assist;Min assist;+2 physical assistance;+2 safety/equipment General transfer comment: completed stand pivot using RW to [facility] with +2 assist, then stood from [facility] with eva walker (pt unable to safely progress with eva walker) therefore sat back down and utilize bari RW to transfer back to recliner.  Poor safety and endurance with maintaining NWB to L LE.  Practice lateral scoots with min-mod asssist +2 to ensure L LE NWB. requires max cueing for technqiues.  May benefit from sliding board  Ambulation/Gait             General Gait Details: unable to while safely maintaining NWB L LE   Stairs             Wheelchair Mobility    Modified Rankin (Stroke Patients Only)       Balance Overall balance assessment: Needs assistance Sitting-balance support: Feet supported Sitting balance-Leahy Scale: Good     Standing balance support: Bilateral upper extremity supported;During functional activity Standing balance-Leahy Scale: Poor Standing balance comment: pt relies on BUE support                            Cognition Arousal/Alertness: Awake/alert Behavior During Therapy: WFL for tasks assessed/performed Overall Cognitive Status: Within Functional Limits for tasks assessed  Exercises      General Comments        Pertinent Vitals/Pain Pain Assessment: Faces Faces Pain Scale: Hurts a little bit Pain Location: L ankle Pain Descriptors / Indicators: Operative site guarding;Discomfort Pain Intervention(s): Limited activity within patient's tolerance;Monitored during session;Repositioned    Home Living                      Prior Function            PT Goals (current goals can now be found in the care plan section) Acute Rehab PT Goals Patient Stated Goal: to get home and  walk independently PT Goal Formulation: With patient Time For Goal Achievement: 06/26/21 Potential to Achieve Goals: Fair Progress towards PT goals: Progressing toward goals    Frequency    Min 3X/week      PT Plan Current plan remains appropriate    Co-evaluation   Reason for Co-Treatment: For patient/therapist safety;To address functional/ADL transfers   OT goals addressed during session: ADL's and self-care      AM-PAC PT "6 Clicks" Mobility   Outcome Measure  Help needed turning from your back to your side while in a flat bed without using bedrails?: None Help needed moving from lying on your back to sitting on the side of a flat bed without using bedrails?: A Little Help needed moving to and from a bed to a chair (including a wheelchair)?: A Little Help needed standing up from a chair using your arms (e.g., wheelchair or bedside chair)?: A Little Help needed to walk in hospital room?: Total Help needed climbing 3-5 steps with a railing? : Total 6 Click Score: 15    End of Session   Activity Tolerance: Patient tolerated treatment well Patient left: in chair;with call bell/phone within reach;with chair alarm set Nurse Communication: Mobility status PT Visit Diagnosis: Muscle weakness (generalized) (M62.81);Pain;Other abnormalities of gait and mobility (R26.89);Unsteadiness on feet (R26.81) Pain - Right/Left: Left Pain - part of body: Ankle and joints of foot     Time: 3086-5784 PT Time Calculation (min) (ACUTE ONLY): 25 min  Charges:  $Therapeutic Activity: 8-22 mins (co-tx with OT)                    Lerry Liner PT, DPT, PN2   Supplemental Physical Therapist [facility]    Pager [phone] Acute Rehab Office [phone]

## 2021-06-24 LAB — SARS CORONAVIRUS 2 (TAT 6-24 HRS): SARS Coronavirus 2: NEGATIVE

## 2021-06-24 LAB — GLUCOSE, CAPILLARY
Glucose-Capillary: 149 mg/dL — ABNORMAL HIGH (ref 70–99)
Glucose-Capillary: 253 mg/dL — ABNORMAL HIGH (ref 70–99)
Glucose-Capillary: 161 mg/dL — ABNORMAL HIGH (ref 70–99)
Glucose-Capillary: 235 mg/dL — ABNORMAL HIGH (ref 70–99)

## 2021-06-24 NOTE — Progress Notes (Signed)
Patient now 13 days post-op s/p hardware removal and left ankle fusion. No acute changes. Is encouraged to make progress and get better. Is somewhat frustrated she remains in the hospital. Patient remains medically stable for discharge. Has abed offer at [facility] in [place], awaiting insurance authorization which has been a barrier to discharge. TOC following. Will continue to follow and hopefully be able to discharge over the next few days.  If patient still in hospital on Wednesday, will plan for repeat x-rays left ankle. Will likely remove splint at that time and transition to boot.    Amario Longmore A. Ladonna Snide [facility] [phone] (office) [email or web address]

## 2021-06-24 NOTE — Discharge Summary (Addendum)
Orthopaedic Trauma Service (OTS) Discharge Summary   Patient ID: [name] MRN: [id number] DOB/AGE: November 15, 1960 60 y.o.  Admit date: 06/11/2021 Discharge date: 06/25/2021  Admission Diagnoses: 1. Endstage left ankle arthritis 2. Diabetic neuropathy  Discharge Diagnoses:  Principal Problem:   Closed bimalleolar fracture of left ankle Active Problems:   Arthritis of left ankle   Past Medical History:  Diagnosis Date   Anemia    iron deficiency   Anxiety    Arthritis    Chronic kidney disease    COPD (chronic obstructive pulmonary disease) (HCC)    Depression    Diabetes mellitus    type 2   Diabetic foot ulcer (HCC) 04/2018   right ankle   Dyspnea    GERD (gastroesophageal reflux disease)    Headache(784.0)    History of colonic polyps    Hyperlipidemia    Hypertension    Myocardial infarction (HCC)    Neuropathy    Peptic ulcer disease    Sleep apnea      Procedures Performed:  CPT 27870-Fusion of left tibiotalar joint CPT 28725-Fusion of left subtalar joint CPT 20680-Removal of hardware left ankle  Discharged Condition: good  Hospital Course: Patient was admitted to hospital on 06/11/2021 for scheduled procedure on left ankle.  Was taken to the operating room by Dr. Jena Gauss for the above procedure.  She tolerated several complications.  Placed in a short leg splint postoperatively instructed be nonweightbearing on the left lower extremity.  Was started on Lovenox for DVT prophylaxis starting on postoperative day #1. Postoperative pain control was somewhat difficult due to patient being on chronic pain medications. Patient began working with physical and occupational therapy starting on postoperative day #1, who recommended SNF placement.  Patient continued working with therapies acutely while in hospital but discharge was delayed due to need for insurance authorization. On 06/25/2021, the patient was tolerating diet, working well with therapies, pain well  controlled, vital signs stable, dressings clean, dry, intact and felt stable for discharge to SNF. Patient will follow up as below and knows to call with questions or concerns.     Consults: None  Significant Diagnostic Studies:   Results for orders placed or performed during the hospital encounter of 06/11/21 (from the past 168 hour(s))  Glucose, capillary   Collection Time: 06/18/21 11:01 AM  Result Value Ref Range   Glucose-Capillary 184 (H) 70 - 99 mg/dL  Glucose, capillary   Collection Time: 06/18/21  4:16 PM  Result Value Ref Range   Glucose-Capillary 178 (H) 70 - 99 mg/dL  Glucose, capillary   Collection Time: 06/18/21  9:39 PM  Result Value Ref Range   Glucose-Capillary 218 (H) 70 - 99 mg/dL  Glucose, capillary   Collection Time: 06/19/21  8:02 AM  Result Value Ref Range   Glucose-Capillary 116 (H) 70 - 99 mg/dL  Glucose, capillary   Collection Time: 06/19/21 11:31 AM  Result Value Ref Range   Glucose-Capillary 180 (H) 70 - 99 mg/dL  Glucose, capillary   Collection Time: 06/19/21  4:21 PM  Result Value Ref Range   Glucose-Capillary 183 (H) 70 - 99 mg/dL  Glucose, capillary   Collection Time: 06/19/21  9:35 PM  Result Value Ref Range   Glucose-Capillary 224 (H) 70 - 99 mg/dL  Glucose, capillary   Collection Time: 06/20/21  7:52 AM  Result Value Ref Range   Glucose-Capillary 111 (H) 70 - 99 mg/dL  Resp Panel by RT-PCR (Flu A&B, Covid) Nasopharyngeal Swab  Collection Time: 06/20/21 10:23 AM   Specimen: Nasopharyngeal Swab; Nasopharyngeal(NP) swabs in vial transport medium  Result Value Ref Range   SARS Coronavirus 2 by RT PCR NEGATIVE NEGATIVE   Influenza A by PCR NEGATIVE NEGATIVE   Influenza B by PCR NEGATIVE NEGATIVE  Glucose, capillary   Collection Time: 06/20/21 11:45 AM  Result Value Ref Range   Glucose-Capillary 135 (H) 70 - 99 mg/dL  Glucose, capillary   Collection Time: 06/20/21  4:28 PM  Result Value Ref Range   Glucose-Capillary 137 (H) 70 - 99  mg/dL  Glucose, capillary   Collection Time: 06/20/21  9:07 PM  Result Value Ref Range   Glucose-Capillary 170 (H) 70 - 99 mg/dL  Glucose, capillary   Collection Time: 06/21/21  8:05 AM  Result Value Ref Range   Glucose-Capillary 125 (H) 70 - 99 mg/dL  Glucose, capillary   Collection Time: 06/21/21 11:25 AM  Result Value Ref Range   Glucose-Capillary 215 (H) 70 - 99 mg/dL  Glucose, capillary   Collection Time: 06/21/21  3:57 PM  Result Value Ref Range   Glucose-Capillary 173 (H) 70 - 99 mg/dL  Glucose, capillary   Collection Time: 06/21/21  8:49 PM  Result Value Ref Range   Glucose-Capillary 243 (H) 70 - 99 mg/dL  Glucose, capillary   Collection Time: 06/22/21 11:30 AM  Result Value Ref Range   Glucose-Capillary 196 (H) 70 - 99 mg/dL  Glucose, capillary   Collection Time: 06/22/21  4:22 PM  Result Value Ref Range   Glucose-Capillary 192 (H) 70 - 99 mg/dL  Glucose, capillary   Collection Time: 06/22/21 10:43 PM  Result Value Ref Range   Glucose-Capillary 182 (H) 70 - 99 mg/dL  Glucose, capillary   Collection Time: 06/23/21  8:55 AM  Result Value Ref Range   Glucose-Capillary 218 (H) 70 - 99 mg/dL  Glucose, capillary   Collection Time: 06/23/21 11:23 AM  Result Value Ref Range   Glucose-Capillary 164 (H) 70 - 99 mg/dL  Glucose, capillary   Collection Time: 06/23/21  4:25 PM  Result Value Ref Range   Glucose-Capillary 188 (H) 70 - 99 mg/dL  Glucose, capillary   Collection Time: 06/23/21  9:23 PM  Result Value Ref Range   Glucose-Capillary 186 (H) 70 - 99 mg/dL  Glucose, capillary   Collection Time: 06/24/21  6:28 AM  Result Value Ref Range   Glucose-Capillary 149 (H) 70 - 99 mg/dL  Glucose, capillary   Collection Time: 06/24/21 11:20 AM  Result Value Ref Range   Glucose-Capillary 253 (H) 70 - 99 mg/dL  SARS CORONAVIRUS 2 (TAT 6-24 HRS) Nasopharyngeal Nasopharyngeal Swab   Collection Time: 06/24/21  2:40 PM   Specimen: Nasopharyngeal Swab  Result Value Ref  Range   SARS Coronavirus 2 NEGATIVE NEGATIVE  Glucose, capillary   Collection Time: 06/24/21  4:02 PM  Result Value Ref Range   Glucose-Capillary 161 (H) 70 - 99 mg/dL  Glucose, capillary   Collection Time: 06/24/21  9:27 PM  Result Value Ref Range   Glucose-Capillary 235 (H) 70 - 99 mg/dL  Creatinine, serum   Collection Time: 06/25/21  6:09 AM  Result Value Ref Range   Creatinine, Ser 1.32 (H) 0.44 - 1.00 mg/dL   GFR, Estimated 47 (L) >60 mL/min  Glucose, capillary   Collection Time: 06/25/21  6:34 AM  Result Value Ref Range   Glucose-Capillary 155 (H) 70 - 99 mg/dL     Treatments: IV hydration, antibiotics: Ancef, analgesia: acetaminophen, Dilaudid, and  oxycodone, cardiac meds: amlodipine and hydralazine, anticoagulation: LMW heparin, insulin: Humalog and regular, therapies: PT and OT, and surgery: as above  Discharge Exam: General: NAD.  Laying in bed. Calm, comfortable Resp: No increased wob Cardio: regular rate and rhythm ABD soft Neurologically intact MSK Neurovascularly intact Sensation intact distally Intact pulses distally Can move toes Splint in place Incision: dressing C/D/I   Disposition: Discharge disposition: 03-Skilled Nursing Facility     Allergies as of 06/25/2021       Reactions   Ceftriaxone Diarrhea, Nausea And Vomiting        Medication List     STOP taking these medications    collagenase ointment Commonly known as: SANTYL   docusate sodium 100 MG capsule Commonly known as: COLACE   feeding supplement (PRO-STAT SUGAR FREE 64) Liqd       TAKE these medications    acetaminophen 500 MG tablet Commonly known as: TYLENOL Take 1,000 mg by mouth every 6 (six) hours as needed for moderate pain.   amLODipine 10 MG tablet Commonly known as: NORVASC Take 10 mg by mouth daily.   atorvastatin 10 MG tablet Commonly known as: LIPITOR Take 10 mg by mouth daily.   bisacodyl 10 MG suppository Commonly known as: DULCOLAX Place 1  suppository (10 mg total) rectally daily as needed for moderate constipation.   buPROPion 200 MG 12 hr tablet Commonly known as: WELLBUTRIN SR Take 200 mg by mouth 2 (two) times daily.   DULoxetine 60 MG capsule Commonly known as: CYMBALTA Take 60 mg by mouth daily.   DULoxetine 30 MG capsule Commonly known as: CYMBALTA Take 30 mg by mouth daily.   enoxaparin 80 MG/0.8ML injection Commonly known as: LOVENOX Inject 0.8 mLs (80 mg total) into the skin daily for 21 days.   ferrous sulfate 325 (65 FE) MG tablet Take 325 mg by mouth daily with breakfast.   fluticasone 50 MCG/ACT nasal spray Commonly known as: FLONASE Place 2 sprays into both nostrils daily as needed for allergies.   HumaLOG KwikPen 100 UNIT/ML KwikPen Generic drug: insulin lispro Inject 12-20 Units as directed in the morning and at bedtime. Per sliding scale   hydrALAZINE 25 MG tablet Commonly known as: APRESOLINE Take 25 mg by mouth 3 (three) times daily.   insulin glargine 100 UNIT/ML injection Commonly known as: LANTUS Inject 0.6 mLs (60 Units total) into the skin 2 (two) times daily. What changed: how much to take   Jardiance 25 MG Tabs tablet Generic drug: empagliflozin Take 25 mg by mouth daily.   lurasidone 40 MG Tabs tablet Commonly known as: LATUDA Take 40 mg by mouth daily.   Lyrica 225 MG capsule Generic drug: pregabalin Take 225 mg by mouth in the morning, at noon, and at bedtime.   MAGNESIUM PO Take 1 capsule by mouth daily.   meloxicam 7.5 MG tablet Commonly known as: MOBIC Take 7.5 mg by mouth at bedtime.   methocarbamol 750 MG tablet Commonly known as: ROBAXIN Take 1 tablet (750 mg total) by mouth 4 (four) times daily for 14 days. What changed:  medication strength how much to take when to take this reasons to take this   metoprolol tartrate 50 MG tablet Commonly known as: LOPRESSOR Take 50 mg by mouth 2 (two) times daily.   MULTI-DAY VITAMINS PO Take 1 tablet by mouth  daily.   omeprazole 40 MG capsule Commonly known as: PRILOSEC Take 40 mg by mouth daily.   oxyCODONE 5 MG immediate release tablet Commonly  known as: Oxy IR/ROXICODONE Take 1-2 tablets (5-10 mg total) by mouth every 6 (six) hours as needed for breakthrough pain (breakthrough pain between Percocet doses).   oxyCODONE-acetaminophen 10-325 MG tablet Commonly known as: PERCOCET Take 1 tablet by mouth every 4 (four) hours as needed for pain. What changed:  when to take this reasons to take this Another medication with the same name was removed. Continue taking this medication, and follow the directions you see here.   Ozempic (1 MG/DOSE) 2 MG/1.5ML Sopn Generic drug: Semaglutide (1 MG/DOSE) Inject 1 mg into the skin every 7 (seven) days.   polyethylene glycol 17 g packet Commonly known as: MIRALAX / GLYCOLAX Take 17 g by mouth daily as needed for mild constipation.   QUEtiapine 100 MG tablet Commonly known as: SEROQUEL Take 100 mg by mouth at bedtime.   Symbicort 80-4.5 MCG/ACT inhaler Generic drug: budesonide-formoterol Inhale 2 puffs into the lungs 2 (two) times daily as needed (shortness of breath or wheezing).   traZODone 100 MG tablet Commonly known as: DESYREL Take 100 mg by mouth at bedtime.        Follow-up Information     Haddix, Gillie Manners, MD. Go on 07/01/2021.   Specialty: Orthopedic Surgery Why: 07/01/21 at 8:30AM for repeat x-rays, wound check, splint/suture removal Contact information: [address] [place] [address] [phone]                 Discharge Instructions and Plan: Patient will be discharged to SNF. Will be discharged on Lovenox for DVT prophylaxis. Patient has been provided with all the necessary DME for discharge. Patient will follow up with Dr. Jena Gauss in 1 week on 07/01/21 at 8:30AM for repeat x-rays and suture/splint removal.   Signed:  Shawn Route. Ladonna Snide ?[phone]? (phone) 06/25/2021, 10:44 AM  [facility] [address] [place] [address] [phone] (F)

## 2021-06-24 NOTE — TOC Progression Note (Addendum)
Transition of Care [facility]) - Initial/Assessment Note    Patient Details  Name: [name] MRN: [id number] Date of Birth: Jan 16, 1961  Transition of Care [facility]) CM/SW Contact:    Ralene Bathe, LCSWA Phone Number: 06/24/2021, 8:55 AM  Clinical Narrative:                 CSW reviewed the NCMUST syste.  The patient still does not have a PASSR.  TOC Barrier:  PASRR number  14:20-  PASRR number received ([id number]).  CSW called the facility to inquire about if they have a bed available today and is awaiting a returned call.    15:30-  CSW received a returned call from [place] at [facility] (SNF).  The facility no longer has any beds available.  15:45-  CSW called [facility] the patient's second facility choice to inquire about bed availability.  The facility can accept the patient tomorrow.  15:50-  CSW called the patient and informed the patient of the above information.  The patient agreed to go to [facility] (SNF).  16:00-  CSW called [facility] and informed them of the need for a bariatric bed.  The facility reported that they do not have any bariatric beds and will not be able to accept the patient.  16:15-  CSW contacted [facility] the other facility who accepted in the HUB and when informed of the patient's weight CSW was informed that she exceeds the facilities weight limit.  16:33- CSW was informed that insurance auth would need to be restarted because of the need to switch facilities.  TOC barriers to d/c-  Bed offer, insurance auth     Patient Goals and CMS Choice Patient states their goals for this hospitalization and ongoing recovery are:: to go to SNF CMS Medicare.gov Compare Post Acute Care list provided to:: Patient Choice offered to / list presented to : Patient  Expected Discharge Plan and Services Expected Discharge Plan: Skilled Nursing Facility In-house Referral: Clinical Social Work     Living arrangements for the past 2 months: Mobile Home                                       Prior Living Arrangements/Services Living arrangements for the past 2 months: Mobile Home Lives with:: Self Patient language and need for interpreter reviewed:: Yes Do you feel safe going back to the place where you live?: No   SNF  Need for Family Participation in Patient Care: Yes (Comment) Care giver support system in place?: Yes (comment)   Criminal Activity/Legal Involvement Pertinent to Current Situation/Hospitalization: No - Comment as needed  Activities of Daily Living Home Assistive Devices/Equipment: Walker (specify type) (front wheel) ADL Screening (condition at time of admission) Patient's cognitive ability adequate to safely complete daily activities?: Yes Is the patient deaf or have difficulty hearing?: No Does the patient have difficulty seeing, even when wearing glasses/contacts?: No Does the patient have difficulty concentrating, remembering, or making decisions?: No Patient able to express need for assistance with ADLs?: Yes Does the patient have difficulty dressing or bathing?: No Independently performs ADLs?: Yes (appropriate for developmental age) Does the patient have difficulty walking or climbing stairs?: Yes Weakness of Legs: Left Weakness of Arms/Hands: None  Permission Sought/Granted Permission sought to share information with : Case Manager, Family Supports, Oceanographer granted to share information with : Yes, Verbal Permission Granted  Share Information  with NAME: Dahlia Client  Permission granted to share info w AGENCY: SNF  Permission granted to share info w Relationship: daughter  Permission granted to share info w Contact Information: Dahlia Client [phone]  Emotional Assessment   Attitude/Demeanor/Rapport: Gracious Affect (typically observed): Calm Orientation: : Oriented to Self, Oriented to Place, Oriented to  Time, Oriented to Situation Alcohol / Substance Use: Not  Applicable Psych Involvement: No (comment)  Admission diagnosis:  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.072] Patient Active Problem List   Diagnosis Date Noted   Arthritis of left ankle 06/11/2021   Closed bimalleolar fracture of left ankle 05/06/2021   Closed left ankle fracture 05/28/2020   Moderate protein malnutrition (HCC)    Adult BMI 50.0-59.9 kg/sq m (HCC)    Normocytic anemia 04/21/2018   Diabetic foot infection (HCC) 04/21/2018   Mild renal insufficiency 04/21/2018   Diabetic infection of right foot (HCC)    Preventative health care 04/14/2011   DYSMETABOLIC SYNDROME 12/08/2010   ULNAR NEUROPATHY, BILATERAL 10/01/2010   Insulin-requiring or dependent type II diabetes mellitus (HCC) 09/11/2010   HYPERLIPIDEMIA 09/11/2010   Morbid (severe) obesity due to excess calories (HCC) 09/11/2010   TOBACCO USE 09/11/2010   Depression with anxiety 09/11/2010   Essential hypertension 09/11/2010   GERD 09/11/2010   PEPTIC ULCER DISEASE 09/11/2010   COLONIC POLYPS, HX OF 09/11/2010   Lumbosacral spondylosis 02/27/2010   PCP:  Wilmer Floor., MD Pharmacy:   [facility] [phone] Rosalita Levan, [place] - [address] AT [facility] [address] [place] [address] Phone: [phone] Fax: [phone]  [id number] - [place], [place] - [address] [facility] [place] [address] Phone: [phone] Fax: [phone]     Social Determinants of Health (SDOH) Interventions    Readmission Risk Interventions No flowsheet data found.

## 2021-06-25 DIAGNOSIS — J449 Chronic obstructive pulmonary disease, unspecified: Secondary | ICD-10-CM | POA: Diagnosis not present

## 2021-06-25 DIAGNOSIS — D649 Anemia, unspecified: Secondary | ICD-10-CM | POA: Diagnosis not present

## 2021-06-25 DIAGNOSIS — Z9889 Other specified postprocedural states: Secondary | ICD-10-CM | POA: Diagnosis not present

## 2021-06-25 DIAGNOSIS — Z794 Long term (current) use of insulin: Secondary | ICD-10-CM | POA: Diagnosis not present

## 2021-06-25 DIAGNOSIS — Z743 Need for continuous supervision: Secondary | ICD-10-CM | POA: Diagnosis not present

## 2021-06-25 DIAGNOSIS — M19072 Primary osteoarthritis, left ankle and foot: Secondary | ICD-10-CM | POA: Diagnosis not present

## 2021-06-25 DIAGNOSIS — E11621 Type 2 diabetes mellitus with foot ulcer: Secondary | ICD-10-CM | POA: Diagnosis not present

## 2021-06-25 DIAGNOSIS — T84099A Other mechanical complication of unspecified internal joint prosthesis, initial encounter: Secondary | ICD-10-CM | POA: Diagnosis not present

## 2021-06-25 DIAGNOSIS — S93432D Sprain of tibiofibular ligament of left ankle, subsequent encounter: Secondary | ICD-10-CM | POA: Diagnosis not present

## 2021-06-25 DIAGNOSIS — E785 Hyperlipidemia, unspecified: Secondary | ICD-10-CM | POA: Diagnosis not present

## 2021-06-25 DIAGNOSIS — G894 Chronic pain syndrome: Secondary | ICD-10-CM | POA: Diagnosis not present

## 2021-06-25 DIAGNOSIS — R262 Difficulty in walking, not elsewhere classified: Secondary | ICD-10-CM | POA: Diagnosis not present

## 2021-06-25 DIAGNOSIS — R5381 Other malaise: Secondary | ICD-10-CM | POA: Diagnosis not present

## 2021-06-25 DIAGNOSIS — Z4789 Encounter for other orthopedic aftercare: Secondary | ICD-10-CM | POA: Diagnosis not present

## 2021-06-25 DIAGNOSIS — Z741 Need for assistance with personal care: Secondary | ICD-10-CM | POA: Diagnosis not present

## 2021-06-25 DIAGNOSIS — G4700 Insomnia, unspecified: Secondary | ICD-10-CM | POA: Diagnosis not present

## 2021-06-25 DIAGNOSIS — R293 Abnormal posture: Secondary | ICD-10-CM | POA: Diagnosis not present

## 2021-06-25 DIAGNOSIS — E1122 Type 2 diabetes mellitus with diabetic chronic kidney disease: Secondary | ICD-10-CM | POA: Diagnosis not present

## 2021-06-25 DIAGNOSIS — M6281 Muscle weakness (generalized): Secondary | ICD-10-CM | POA: Diagnosis not present

## 2021-06-25 DIAGNOSIS — E119 Type 2 diabetes mellitus without complications: Secondary | ICD-10-CM | POA: Diagnosis not present

## 2021-06-25 DIAGNOSIS — S82842D Displaced bimalleolar fracture of left lower leg, subsequent encounter for closed fracture with routine healing: Secondary | ICD-10-CM | POA: Diagnosis not present

## 2021-06-25 DIAGNOSIS — I10 Essential (primary) hypertension: Secondary | ICD-10-CM | POA: Diagnosis not present

## 2021-06-25 DIAGNOSIS — R531 Weakness: Secondary | ICD-10-CM | POA: Diagnosis not present

## 2021-06-25 LAB — GLUCOSE, CAPILLARY
Glucose-Capillary: 155 mg/dL — ABNORMAL HIGH (ref 70–99)
Glucose-Capillary: 273 mg/dL — ABNORMAL HIGH (ref 70–99)

## 2021-06-25 LAB — CREATININE, SERUM
Creatinine, Ser: 1.32 mg/dL — ABNORMAL HIGH (ref 0.44–1.00)
GFR, Estimated: 47 mL/min — ABNORMAL LOW (ref >60)

## 2021-06-25 NOTE — Plan of Care (Signed)

## 2021-06-25 NOTE — TOC Transition Note (Signed)
Transition of Care [facility]) - CM/SW Discharge Note   Patient Details  Name: [name] MRN: [id number] Date of Birth: 03-18-1961  Transition of Care [facility]) CM/SW Contact:  Ralene Bathe, LCSWA Phone Number: 06/25/2021, 11:44 AM   Clinical Narrative:     Patient will DC to: [facility] Anticipated DC date: 06/25/2021 Family notified: Yes Transport by: Sharin Mons   Per MD patient ready for DC to SNF. RN to call report prior to discharge [phone] and ask to speak with the 300 floor RN.  Patient will be in room 304.   RN, patient, patient's family, and facility notified of DC. Discharge Summary and FL2 sent to facility. DC packet on chart. Ambulance transport will be requested for patient when RN reports that patient is ready.   CSW will sign off for now as social work intervention is no longer needed. Please consult [place] again if new needs arise.    Final next level of care: Skilled Nursing Facility Barriers to Discharge: Continued Medical Work up   Patient Goals and CMS Choice Patient states their goals for this hospitalization and ongoing recovery are:: to go to SNF CMS Medicare.gov Compare Post Acute Care list provided to:: Patient Choice offered to / list presented to : Patient  Discharge Placement              Patient chooses bed at: [facility] Patient to be transferred to facility by: [facility] Name of family member notified: Lupita Raider Daughter     [phone] Patient and family notified of of transfer: 06/25/21  Discharge Plan and Services In-house Referral: Clinical Social Work                                   Social Determinants of Health (SDOH) Interventions     Readmission Risk Interventions No flowsheet data found.

## 2021-06-25 NOTE — Progress Notes (Signed)
PT Cancellation Note  Patient Details Name: CASSIOPEIA FLORENTINO MRN: [id number] DOB: 1960/10/14   Cancelled Treatment:    Reason Eval/Treat Not Completed: Other (comment) going to SNF today- holding therapy per dept protocol. Thank you for the opportunity to participate in her care!   Madelaine Etienne, DPT, PN2   Supplemental Physical Therapist [facility]    Pager [phone] Acute Rehab Office [phone]

## 2021-06-26 DIAGNOSIS — Z794 Long term (current) use of insulin: Secondary | ICD-10-CM | POA: Diagnosis not present

## 2021-06-26 DIAGNOSIS — R262 Difficulty in walking, not elsewhere classified: Secondary | ICD-10-CM | POA: Diagnosis not present

## 2021-06-26 DIAGNOSIS — E1122 Type 2 diabetes mellitus with diabetic chronic kidney disease: Secondary | ICD-10-CM | POA: Diagnosis not present

## 2021-06-26 DIAGNOSIS — Z9889 Other specified postprocedural states: Secondary | ICD-10-CM | POA: Diagnosis not present

## 2021-07-01 DIAGNOSIS — E1122 Type 2 diabetes mellitus with diabetic chronic kidney disease: Secondary | ICD-10-CM | POA: Diagnosis not present

## 2021-07-01 DIAGNOSIS — Z9889 Other specified postprocedural states: Secondary | ICD-10-CM | POA: Diagnosis not present

## 2021-07-01 DIAGNOSIS — Z794 Long term (current) use of insulin: Secondary | ICD-10-CM | POA: Diagnosis not present

## 2021-07-01 DIAGNOSIS — R262 Difficulty in walking, not elsewhere classified: Secondary | ICD-10-CM | POA: Diagnosis not present

## 2021-07-07 DIAGNOSIS — M19072 Primary osteoarthritis, left ankle and foot: Secondary | ICD-10-CM | POA: Diagnosis not present

## 2021-07-07 DIAGNOSIS — S82842D Displaced bimalleolar fracture of left lower leg, subsequent encounter for closed fracture with routine healing: Secondary | ICD-10-CM | POA: Diagnosis not present

## 2021-07-07 DIAGNOSIS — S93432D Sprain of tibiofibular ligament of left ankle, subsequent encounter: Secondary | ICD-10-CM | POA: Diagnosis not present

## 2021-07-15 DIAGNOSIS — I129 Hypertensive chronic kidney disease with stage 1 through stage 4 chronic kidney disease, or unspecified chronic kidney disease: Secondary | ICD-10-CM | POA: Diagnosis not present

## 2021-07-15 DIAGNOSIS — Z86010 Personal history of colonic polyps: Secondary | ICD-10-CM | POA: Diagnosis not present

## 2021-07-15 DIAGNOSIS — Z8711 Personal history of peptic ulcer disease: Secondary | ICD-10-CM | POA: Diagnosis not present

## 2021-07-15 DIAGNOSIS — E1140 Type 2 diabetes mellitus with diabetic neuropathy, unspecified: Secondary | ICD-10-CM | POA: Diagnosis not present

## 2021-07-15 DIAGNOSIS — E1159 Type 2 diabetes mellitus with other circulatory complications: Secondary | ICD-10-CM | POA: Diagnosis not present

## 2021-07-15 DIAGNOSIS — D631 Anemia in chronic kidney disease: Secondary | ICD-10-CM | POA: Diagnosis not present

## 2021-07-15 DIAGNOSIS — Z981 Arthrodesis status: Secondary | ICD-10-CM | POA: Diagnosis not present

## 2021-07-15 DIAGNOSIS — D509 Iron deficiency anemia, unspecified: Secondary | ICD-10-CM | POA: Diagnosis not present

## 2021-07-15 DIAGNOSIS — Z4789 Encounter for other orthopedic aftercare: Secondary | ICD-10-CM | POA: Diagnosis not present

## 2021-07-15 DIAGNOSIS — Z7951 Long term (current) use of inhaled steroids: Secondary | ICD-10-CM | POA: Diagnosis not present

## 2021-07-15 DIAGNOSIS — J449 Chronic obstructive pulmonary disease, unspecified: Secondary | ICD-10-CM | POA: Diagnosis not present

## 2021-07-15 DIAGNOSIS — N189 Chronic kidney disease, unspecified: Secondary | ICD-10-CM | POA: Diagnosis not present

## 2021-07-15 DIAGNOSIS — I252 Old myocardial infarction: Secondary | ICD-10-CM | POA: Diagnosis not present

## 2021-07-15 DIAGNOSIS — K219 Gastro-esophageal reflux disease without esophagitis: Secondary | ICD-10-CM | POA: Diagnosis not present

## 2021-07-15 DIAGNOSIS — E1122 Type 2 diabetes mellitus with diabetic chronic kidney disease: Secondary | ICD-10-CM | POA: Diagnosis not present

## 2021-07-15 DIAGNOSIS — E785 Hyperlipidemia, unspecified: Secondary | ICD-10-CM | POA: Diagnosis not present

## 2021-07-15 DIAGNOSIS — Z7984 Long term (current) use of oral hypoglycemic drugs: Secondary | ICD-10-CM | POA: Diagnosis not present

## 2021-07-15 DIAGNOSIS — M47817 Spondylosis without myelopathy or radiculopathy, lumbosacral region: Secondary | ICD-10-CM | POA: Diagnosis not present

## 2021-07-18 DIAGNOSIS — J449 Chronic obstructive pulmonary disease, unspecified: Secondary | ICD-10-CM | POA: Diagnosis not present

## 2021-07-18 DIAGNOSIS — E785 Hyperlipidemia, unspecified: Secondary | ICD-10-CM | POA: Diagnosis not present

## 2021-07-18 DIAGNOSIS — Z981 Arthrodesis status: Secondary | ICD-10-CM | POA: Diagnosis not present

## 2021-07-18 DIAGNOSIS — N189 Chronic kidney disease, unspecified: Secondary | ICD-10-CM | POA: Diagnosis not present

## 2021-07-18 DIAGNOSIS — K219 Gastro-esophageal reflux disease without esophagitis: Secondary | ICD-10-CM | POA: Diagnosis not present

## 2021-07-18 DIAGNOSIS — I252 Old myocardial infarction: Secondary | ICD-10-CM | POA: Diagnosis not present

## 2021-07-18 DIAGNOSIS — Z7984 Long term (current) use of oral hypoglycemic drugs: Secondary | ICD-10-CM | POA: Diagnosis not present

## 2021-07-18 DIAGNOSIS — Z86010 Personal history of colonic polyps: Secondary | ICD-10-CM | POA: Diagnosis not present

## 2021-07-18 DIAGNOSIS — E1122 Type 2 diabetes mellitus with diabetic chronic kidney disease: Secondary | ICD-10-CM | POA: Diagnosis not present

## 2021-07-18 DIAGNOSIS — Z8711 Personal history of peptic ulcer disease: Secondary | ICD-10-CM | POA: Diagnosis not present

## 2021-07-18 DIAGNOSIS — D631 Anemia in chronic kidney disease: Secondary | ICD-10-CM | POA: Diagnosis not present

## 2021-07-18 DIAGNOSIS — D509 Iron deficiency anemia, unspecified: Secondary | ICD-10-CM | POA: Diagnosis not present

## 2021-07-18 DIAGNOSIS — Z7951 Long term (current) use of inhaled steroids: Secondary | ICD-10-CM | POA: Diagnosis not present

## 2021-07-18 DIAGNOSIS — M47817 Spondylosis without myelopathy or radiculopathy, lumbosacral region: Secondary | ICD-10-CM | POA: Diagnosis not present

## 2021-07-18 DIAGNOSIS — Z4789 Encounter for other orthopedic aftercare: Secondary | ICD-10-CM | POA: Diagnosis not present

## 2021-07-18 DIAGNOSIS — E1140 Type 2 diabetes mellitus with diabetic neuropathy, unspecified: Secondary | ICD-10-CM | POA: Diagnosis not present

## 2021-07-18 DIAGNOSIS — I129 Hypertensive chronic kidney disease with stage 1 through stage 4 chronic kidney disease, or unspecified chronic kidney disease: Secondary | ICD-10-CM | POA: Diagnosis not present

## 2021-07-18 DIAGNOSIS — E1159 Type 2 diabetes mellitus with other circulatory complications: Secondary | ICD-10-CM | POA: Diagnosis not present

## 2021-07-21 DIAGNOSIS — F17210 Nicotine dependence, cigarettes, uncomplicated: Secondary | ICD-10-CM | POA: Insufficient documentation

## 2021-07-21 DIAGNOSIS — M47817 Spondylosis without myelopathy or radiculopathy, lumbosacral region: Secondary | ICD-10-CM | POA: Diagnosis not present

## 2021-07-21 DIAGNOSIS — R0902 Hypoxemia: Secondary | ICD-10-CM | POA: Diagnosis not present

## 2021-07-21 DIAGNOSIS — M25572 Pain in left ankle and joints of left foot: Secondary | ICD-10-CM | POA: Diagnosis not present

## 2021-07-21 DIAGNOSIS — J449 Chronic obstructive pulmonary disease, unspecified: Secondary | ICD-10-CM | POA: Insufficient documentation

## 2021-07-21 DIAGNOSIS — E1122 Type 2 diabetes mellitus with diabetic chronic kidney disease: Secondary | ICD-10-CM | POA: Insufficient documentation

## 2021-07-21 DIAGNOSIS — M79662 Pain in left lower leg: Secondary | ICD-10-CM | POA: Diagnosis not present

## 2021-07-21 DIAGNOSIS — R739 Hyperglycemia, unspecified: Secondary | ICD-10-CM | POA: Diagnosis not present

## 2021-07-21 DIAGNOSIS — I129 Hypertensive chronic kidney disease with stage 1 through stage 4 chronic kidney disease, or unspecified chronic kidney disease: Secondary | ICD-10-CM | POA: Diagnosis not present

## 2021-07-21 DIAGNOSIS — D631 Anemia in chronic kidney disease: Secondary | ICD-10-CM | POA: Diagnosis not present

## 2021-07-21 DIAGNOSIS — Z79899 Other long term (current) drug therapy: Secondary | ICD-10-CM | POA: Diagnosis not present

## 2021-07-21 DIAGNOSIS — N189 Chronic kidney disease, unspecified: Secondary | ICD-10-CM | POA: Diagnosis not present

## 2021-07-21 DIAGNOSIS — Z4789 Encounter for other orthopedic aftercare: Secondary | ICD-10-CM | POA: Diagnosis not present

## 2021-07-21 DIAGNOSIS — Z981 Arthrodesis status: Secondary | ICD-10-CM | POA: Diagnosis not present

## 2021-07-21 DIAGNOSIS — I252 Old myocardial infarction: Secondary | ICD-10-CM | POA: Diagnosis not present

## 2021-07-21 DIAGNOSIS — Z743 Need for continuous supervision: Secondary | ICD-10-CM | POA: Diagnosis not present

## 2021-07-21 DIAGNOSIS — Z8711 Personal history of peptic ulcer disease: Secondary | ICD-10-CM | POA: Diagnosis not present

## 2021-07-21 DIAGNOSIS — E785 Hyperlipidemia, unspecified: Secondary | ICD-10-CM | POA: Diagnosis not present

## 2021-07-21 DIAGNOSIS — D509 Iron deficiency anemia, unspecified: Secondary | ICD-10-CM | POA: Diagnosis not present

## 2021-07-21 DIAGNOSIS — M19072 Primary osteoarthritis, left ankle and foot: Secondary | ICD-10-CM | POA: Diagnosis not present

## 2021-07-21 DIAGNOSIS — Z7951 Long term (current) use of inhaled steroids: Secondary | ICD-10-CM | POA: Diagnosis not present

## 2021-07-21 DIAGNOSIS — Z7984 Long term (current) use of oral hypoglycemic drugs: Secondary | ICD-10-CM | POA: Insufficient documentation

## 2021-07-21 DIAGNOSIS — K219 Gastro-esophageal reflux disease without esophagitis: Secondary | ICD-10-CM | POA: Diagnosis not present

## 2021-07-21 DIAGNOSIS — Z86010 Personal history of colonic polyps: Secondary | ICD-10-CM | POA: Diagnosis not present

## 2021-07-21 DIAGNOSIS — E1140 Type 2 diabetes mellitus with diabetic neuropathy, unspecified: Secondary | ICD-10-CM | POA: Diagnosis not present

## 2021-07-21 DIAGNOSIS — R609 Edema, unspecified: Secondary | ICD-10-CM | POA: Diagnosis not present

## 2021-07-21 DIAGNOSIS — Z794 Long term (current) use of insulin: Secondary | ICD-10-CM | POA: Insufficient documentation

## 2021-07-21 DIAGNOSIS — E1159 Type 2 diabetes mellitus with other circulatory complications: Secondary | ICD-10-CM | POA: Diagnosis not present

## 2021-07-22 ENCOUNTER — Emergency Department (HOSPITAL_COMMUNITY)
Admission: EM | Admit: 2021-07-22 | Discharge: 2021-07-22 | Disposition: A | Discharge status: Home / Self Care | Payer: Medicare Other | Attending: Emergency Medicine | Admitting: Emergency Medicine

## 2021-07-22 ENCOUNTER — Emergency Department (HOSPITAL_BASED_OUTPATIENT_CLINIC_OR_DEPARTMENT_OTHER): Payer: Medicare Other

## 2021-07-22 ENCOUNTER — Encounter (HOSPITAL_COMMUNITY): Payer: Self-pay | Admitting: Emergency Medicine

## 2021-07-22 ENCOUNTER — Emergency Department (HOSPITAL_COMMUNITY): Payer: Medicare Other

## 2021-07-22 DIAGNOSIS — Z743 Need for continuous supervision: Secondary | ICD-10-CM | POA: Diagnosis not present

## 2021-07-22 DIAGNOSIS — Z7401 Bed confinement status: Secondary | ICD-10-CM | POA: Diagnosis not present

## 2021-07-22 DIAGNOSIS — R6889 Other general symptoms and signs: Secondary | ICD-10-CM | POA: Diagnosis not present

## 2021-07-22 DIAGNOSIS — M25572 Pain in left ankle and joints of left foot: Secondary | ICD-10-CM | POA: Diagnosis not present

## 2021-07-22 DIAGNOSIS — M79605 Pain in left leg: Secondary | ICD-10-CM

## 2021-07-22 DIAGNOSIS — R609 Edema, unspecified: Secondary | ICD-10-CM | POA: Diagnosis not present

## 2021-07-22 DIAGNOSIS — M19072 Primary osteoarthritis, left ankle and foot: Secondary | ICD-10-CM | POA: Diagnosis not present

## 2021-07-22 DIAGNOSIS — J449 Chronic obstructive pulmonary disease, unspecified: Secondary | ICD-10-CM | POA: Diagnosis not present

## 2021-07-22 DIAGNOSIS — M79662 Pain in left lower leg: Secondary | ICD-10-CM | POA: Diagnosis not present

## 2021-07-22 LAB — CBG MONITORING, ED: Glucose-Capillary: 135 mg/dL — ABNORMAL HIGH (ref 70–99)

## 2021-07-22 MED ORDER — OXYCODONE HCL 10 MG PO TABS: 10.0000 mg | ORAL_TABLET | Freq: Four times a day (QID) | ORAL | 0 refills | Status: DC | PRN

## 2021-07-22 MED ORDER — OXYCODONE-ACETAMINOPHEN 5-325 MG PO TABS
1.0000 | ORAL_TABLET | Freq: Once | ORAL | Status: AC
  Administered 2021-07-22: 1 via ORAL
  Filled 2021-07-22: qty 1

## 2021-07-22 MED ORDER — OXYCODONE HCL 5 MG PO TABS
10.0000 mg | ORAL_TABLET | Freq: Once | ORAL | Status: AC
  Administered 2021-07-22: 10 mg via ORAL
  Filled 2021-07-22: qty 2

## 2021-07-22 MED ORDER — OXYCODONE HCL 10 MG PO TABS
10.0000 mg | ORAL_TABLET | Freq: Four times a day (QID) | ORAL | 0 refills | Status: DC | PRN
Start: 2021-07-22 — End: 2021-08-08

## 2021-07-22 MED ORDER — HYDROMORPHONE HCL 1 MG/ML IJ SOLN
0.5000 mg | Freq: Once | INTRAMUSCULAR | Status: AC
  Administered 2021-07-22: 0.5 mg via INTRAVENOUS

## 2021-07-22 MED ORDER — HYDROMORPHONE HCL 1 MG/ML IJ SOLN
0.5000 mg | Freq: Once | INTRAMUSCULAR | Status: DC
Start: 2021-07-22 — End: 2021-07-22
  Filled 2021-07-22: qty 1

## 2021-07-22 MED ORDER — HYDROMORPHONE HCL 1 MG/ML IJ SOLN
1.0000 mg | Freq: Once | INTRAMUSCULAR | Status: AC
  Administered 2021-07-22: 1 mg via INTRAVENOUS
  Filled 2021-07-22: qty 1

## 2021-07-22 NOTE — ED Triage Notes (Addendum)
Per EMS, pt from home, reports she was getting up and heard a pop in her left lower leg. Pt has "rods placed on leg" in September.  There is swelling in her leg as well.  Pt took 10mg  of Oxy per pt at 9pm.    94% RA 335 CBG

## 2021-07-22 NOTE — ED Provider Notes (Signed)
[facility] EMERGENCY DEPARTMENT Provider Note   CSN: [id number] Arrival date & time: 07/21/21  2319     History Chief Complaint  Patient presents with   Leg Pain    [name] is a 60 y.o. female.  60 year old female with prior medical history as detailed below presents for evaluation of left lower extremity pain.  Patient reports recent operative intervention on her left ankle.  She reports feeling a pop sensation in her left ankle yesterday evening.  She reports increased pain to the area since.  She is ambulating with difficulty using a rolling walker.  She is using oxycodone at home for fever.  She denies associated fever, redness, drainage from her operative site in the left lower leg.  The history is provided by the patient.  Leg Pain Location:  Leg and ankle Time since incident:  1 day Leg location:  L lower leg Ankle location:  L ankle Pain details:    Quality:  Aching   Radiates to:  Does not radiate   Severity:  Moderate   Onset quality:  Sudden   Duration:  1 day   Timing:  Constant   Progression:  Waxing and waning Chronicity:  Recurrent     Past Medical History:  Diagnosis Date   Anemia    iron deficiency   Anxiety    Arthritis    Chronic kidney disease    COPD (chronic obstructive pulmonary disease) (HCC)    Depression    Diabetes mellitus    type 2   Diabetic foot ulcer (HCC) 04/2018   right ankle   Dyspnea    GERD (gastroesophageal reflux disease)    Headache(784.0)    History of colonic polyps    Hyperlipidemia    Hypertension    Myocardial infarction (HCC)    Neuropathy    Peptic ulcer disease    Sleep apnea     Patient Active Problem List   Diagnosis Date Noted   Arthritis of left ankle 06/11/2021   Closed bimalleolar fracture of left ankle 05/06/2021   Closed left ankle fracture 05/28/2020   Moderate protein malnutrition (HCC)    Adult BMI 50.0-59.9 kg/sq m (HCC)    Normocytic anemia 04/21/2018   Diabetic  foot infection (HCC) 04/21/2018   Mild renal insufficiency 04/21/2018   Diabetic infection of right foot (HCC)    Preventative health care 04/14/2011   DYSMETABOLIC SYNDROME 12/08/2010   ULNAR NEUROPATHY, BILATERAL 10/01/2010   Insulin-requiring or dependent type II diabetes mellitus (HCC) 09/11/2010   HYPERLIPIDEMIA 09/11/2010   Morbid (severe) obesity due to excess calories (HCC) 09/11/2010   TOBACCO USE 09/11/2010   Depression with anxiety 09/11/2010   Essential hypertension 09/11/2010   GERD 09/11/2010   PEPTIC ULCER DISEASE 09/11/2010   COLONIC POLYPS, HX OF 09/11/2010   Lumbosacral spondylosis 02/27/2010    Past Surgical History:  Procedure Laterality Date   ANKLE FUSION Left 06/11/2021   Procedure: ANKLE FUSION;  Surgeon: Roby Lofts, MD;  Location: [facility] OR;  Service: Orthopedics;  Laterality: Left;   ESOPHAGOGASTRODUODENOSCOPY  10/11/2011   Procedure: ESOPHAGOGASTRODUODENOSCOPY (EGD);  Surgeon: Erick Blinks, MD;  Location: [facility] ENDOSCOPY;  Service: Gastroenterology;  Laterality: N/A;   ORIF ANKLE FRACTURE Left 05/29/2020   Procedure: OPEN REDUCTION INTERNAL FIXATION (ORIF) ANKLE FRACTURE;  Surgeon: Roby Lofts, MD;  Location: [facility] OR;  Service: Orthopedics;  Laterality: Left;   TUBAL LIGATION       OB History   No obstetric history on  file.     Family History  Problem Relation Age of Onset   Alcohol abuse Other    Drug abuse Other    Arthritis Other    Diabetes Other    Depression Other    Hypertension Other    Kidney disease Other    Cancer Other        Breast,Colon,Lung, and Prostate Cancer   Stomach cancer Father 50   Heart attack Brother 29   Diabetes type II Other        brother, both parents    Social History   Tobacco Use   Smoking status: Every Day    Packs/day: 1.00    Years: 25.00    Pack years: 25.00    Types: Cigarettes   Smokeless tobacco: Never  Vaping Use   Vaping Use: Never used  Substance Use Topics   Alcohol use: No   Drug use: No     Home Medications Prior to Admission medications   Medication Sig Start Date End Date Taking? Authorizing Provider  acetaminophen (TYLENOL) 500 MG tablet Take 1,000 mg by mouth every 6 (six) hours as needed for moderate pain.    [provider]  amLODipine (NORVASC) 10 MG tablet Take 10 mg by mouth daily. 05/22/20   [provider]  atorvastatin (LIPITOR) 10 MG tablet Take 10 mg by mouth daily. 05/05/20   [provider]  bisacodyl (DULCOLAX) 10 MG suppository Place 1 suppository (10 mg total) rectally daily as needed for moderate constipation. 06/19/21   Despina Hidden, PA-C  buPROPion (WELLBUTRIN SR) 200 MG 12 hr tablet Take 200 mg by mouth 2 (two) times daily. 04/16/18   [provider]  DULoxetine (CYMBALTA) 30 MG capsule Take 30 mg by mouth daily. 04/16/18   [provider]  DULoxetine (CYMBALTA) 60 MG capsule Take 60 mg by mouth daily.     [provider]  enoxaparin (LOVENOX) 80 MG/0.8ML injection Inject 0.8 mLs (80 mg total) into the skin daily for 21 days. 06/19/21 07/10/21  Despina Hidden, PA-C  ferrous sulfate 325 (65 FE) MG tablet Take 325 mg by mouth daily with breakfast.    [provider]  fluticasone (FLONASE) 50 MCG/ACT nasal spray Place 2 sprays into both nostrils daily as needed for allergies. 02/16/18   [provider]  HUMALOG KWIKPEN 100 UNIT/ML KiwkPen Inject 12-20 Units as directed in the morning and at bedtime. Per sliding scale 02/10/18   [provider]  hydrALAZINE (APRESOLINE) 25 MG tablet Take 25 mg by mouth 3 (three) times daily.    [provider]  insulin glargine (LANTUS) 100 UNIT/ML injection Inject 0.6 mLs (60 Units total) into the skin 2 (two) times daily. Patient taking differently: Inject 100 Units into the skin 2 (two) times daily. 06/04/20   Osvaldo Shipper, MD  JARDIANCE 25 MG TABS tablet Take 25 mg by mouth daily. 05/22/20   [provider]  lurasidone (LATUDA) 40  MG TABS tablet Take 40 mg by mouth daily. 04/16/18   [provider]  LYRICA 225 MG capsule Take 225 mg by mouth in the morning, at noon, and at bedtime. 04/09/18   [provider]  MAGNESIUM PO Take 1 capsule by mouth daily.    [provider]  meloxicam (MOBIC) 7.5 MG tablet Take 7.5 mg by mouth at bedtime. 03/29/18   [provider]  metoprolol tartrate (LOPRESSOR) 50 MG tablet Take 50 mg by mouth 2 (two) times daily. 02/10/18  [provider]  Multiple Vitamin (MULTI-DAY VITAMINS PO) Take 1 tablet by mouth daily.    [provider]  omeprazole (PRILOSEC) 40 MG capsule Take 40 mg by mouth daily. 03/19/18   [provider]  oxyCODONE (OXY IR/ROXICODONE) 5 MG immediate release tablet Take 1-2 tablets (5-10 mg total) by mouth every 6 (six) hours as needed for breakthrough pain (breakthrough pain between Percocet doses). 06/19/21   Despina Hidden, PA-C  oxyCODONE-acetaminophen (PERCOCET) 10-325 MG tablet Take 1 tablet by mouth every 4 (four) hours as needed for pain. 06/19/21   Despina Hidden, PA-C  polyethylene glycol (MIRALAX / GLYCOLAX) 17 g packet Take 17 g by mouth daily as needed for mild constipation. 06/19/21   Despina Hidden, PA-C  QUEtiapine (SEROQUEL) 100 MG tablet Take 100 mg by mouth at bedtime. 04/16/18   [provider]  Semaglutide, 1 MG/DOSE, (OZEMPIC, 1 MG/DOSE,) 2 MG/1.5ML SOPN Inject 1 mg into the skin every 7 (seven) days. 03/05/20   [provider]  SYMBICORT 80-4.5 MCG/ACT inhaler Inhale 2 puffs into the lungs 2 (two) times daily as needed (shortness of breath or wheezing). 03/19/18   [provider]  traZODone (DESYREL) 100 MG tablet Take 100 mg by mouth at bedtime. 04/22/20   [provider]    Allergies    Ceftriaxone  Review of Systems   Review of Systems  All other systems reviewed and are negative.  Physical Exam Updated Vital Signs BP (!) 169/65 (BP Location: Left Arm)    Pulse 78   Temp 97.9 F (36.6 C) (Oral)   Resp (!) 22   LMP  (LMP Unknown)   SpO2 94%   Physical Exam Vitals and nursing note reviewed.  Constitutional:      General: She is not in acute distress.    Appearance: Normal appearance. She is well-developed.  HENT:     Head: Normocephalic and atraumatic.  Eyes:     Conjunctiva/sclera: Conjunctivae normal.     Pupils: Pupils are equal, round, and reactive to light.  Cardiovascular:     Rate and Rhythm: Normal rate and regular rhythm.     Heart sounds: Normal heart sounds.  Pulmonary:     Effort: Pulmonary effort is normal. No respiratory distress.     Breath sounds: Normal breath sounds.  Abdominal:     General: There is no distension.     Palpations: Abdomen is soft.     Tenderness: There is no abdominal tenderness.  Musculoskeletal:        General: No deformity. Normal range of motion.     Cervical back: Normal range of motion and neck supple.     Comments: Diffuse tenderness to palpation overlying the medial and lateral aspects of the left ankle.  No overlying erythema.  No significant edema noted.  Operative site wounds are clean, dry, and intact.  Skin:    General: Skin is warm and dry.  Neurological:     General: No focal deficit present.     Mental Status: She is alert and oriented to person, place, and time.    ED Results / Procedures / Treatments   Labs (all labs ordered are listed, but only abnormal results are displayed) Labs Reviewed  CBG MONITORING, ED - Abnormal; Notable for the following components:      Result Value   Glucose-Capillary 135 (*)    All other components within normal limits    EKG None  Radiology DG Tibia/Fibula Left  Result  Date: 07/22/2021 CLINICAL DATA:  Pain EXAM: LEFT TIBIA AND FIBULA - 2 VIEW; LEFT FOOT - COMPLETE 3+ VIEW COMPARISON:  06/11/2021 ankle radiographs FINDINGS: Redemonstrated intramedullary nail extending from the tibia through the posterior talus and calcaneus, with  proximal and distal locking screws, in overall unchanged alignment and configuration compared to 06/11/2021. No perihardware lucency. Hardware appears intact. Redemonstrated resection of the distal fibula. Osteopenia. No acute fracture or dislocation in the foot, tibia, or fibula. Degenerative changes most prominent in the midfoot. Tricompartmental joint space narrowing in the knee. IMPRESSION: No acute fracture or dislocation. Unchanged appearance distal tibial/ankle hardware. No evidence of hardware failure. Electronically Signed   By: Wiliam Ke M.D.   On: 07/22/2021 01:39   DG Foot Complete Left  Result Date: 07/22/2021 CLINICAL DATA:  Pain EXAM: LEFT TIBIA AND FIBULA - 2 VIEW; LEFT FOOT - COMPLETE 3+ VIEW COMPARISON:  06/11/2021 ankle radiographs FINDINGS: Redemonstrated intramedullary nail extending from the tibia through the posterior talus and calcaneus, with proximal and distal locking screws, in overall unchanged alignment and configuration compared to 06/11/2021. No perihardware lucency. Hardware appears intact. Redemonstrated resection of the distal fibula. Osteopenia. No acute fracture or dislocation in the foot, tibia, or fibula. Degenerative changes most prominent in the midfoot. Tricompartmental joint space narrowing in the knee. IMPRESSION: No acute fracture or dislocation. Unchanged appearance distal tibial/ankle hardware. No evidence of hardware failure. Electronically Signed   By: Wiliam Ke M.D.   On: 07/22/2021 01:39    Procedures Procedures   Medications Ordered in ED Medications  oxyCODONE-acetaminophen (PERCOCET/ROXICET) 5-325 MG per tablet 1 tablet (1 tablet Oral Given 07/22/21 0104)  HYDROmorphone (DILAUDID) injection 0.5 mg (0.5 mg Intravenous Given 07/22/21 1106)    ED Course  I have reviewed the triage vital signs and the nursing notes.  Pertinent labs & imaging results that were available during my care of the patient were reviewed by me and considered in my  medical decision making (see chart for details).    MDM Rules/Calculators/A&P                           MDM  MSE complete  Anelly C Nofziger was evaluated in Emergency Department on 07/22/2021 for the symptoms described in the history of present illness. She was evaluated in the context of the global COVID-19 pandemic, which necessitated consideration that the patient might be at risk for infection with the SARS-CoV-2 virus that causes COVID-19. Institutional protocols and algorithms that pertain to the evaluation of patients at risk for COVID-19 are in a state of rapid change based on information released by regulatory bodies including the CDC and federal and state organizations. These policies and algorithms were followed during the patient's care in the ED.  Patient is presenting with complaint of pain to the left lower extremity.  Imaging was without evidence of new pathology in the left ankle.  DVT study was negative.  Patient is improved after administration of IV pain medications.  Patient does understand need for close follow-up with Dr. Jena Gauss in the clinic.  Patient will be provided with a short course of additional narcotics for use at home.    Final Clinical Impression(s) / ED Diagnoses Final diagnoses:  Left leg pain    Rx / DC Orders ED Discharge Orders          Ordered    oxyCODONE 10 MG TABS  Every 6 hours PRN  07/22/21 1435             Wynetta Fines, MD 07/22/21 1435

## 2021-07-22 NOTE — ED Notes (Signed)
[facility] called pt has 12 people in-front of her

## 2021-07-22 NOTE — ED Provider Notes (Signed)
Emergency Medicine Provider Triage Evaluation Note  [name] , a 60 y.o. female  was evaluated in triage.  Pt complains of left lower leg pain. Patient had surgery to her L ankle 06/11/21. Tonight she stepped and felt a pop sensation in her left lower leg/ankle and onset of pain/swelling. Worse with movement. Baseline neuropathy  Review of Systems  Positive: Arthralgia, joint swelling, baseline neuropathy Negative: Fever, drainage  Physical Exam  BP (!) 112/56 (BP Location: Right Arm)   Pulse 72   Temp 98.4 F (36.9 C) (Oral)   Resp 17   LMP  (LMP Unknown)   SpO2 94%  Gen:   Awake, no distress   Resp:  Normal effort  MSK:   LLE: 2+ Dp pulse. Blister to medial 1st MTP. Skin changes to the lateral left ankle. Tender to the calcaneus and distal 1/2 of the left lower leg.   Medical Decision Making  Medically screening exam initiated at 1:10 AM.  Appropriate orders placed.  [name] was informed that the remainder of the evaluation will be completed by another provider, this initial triage assessment does not replace that evaluation, and the importance of remaining in the ED until their evaluation is complete.  Ankle pain   Cherly Anderson, PA-C 07/22/21 0111    Mesner, Barbara Cower, MD 07/22/21 [phone]

## 2021-07-22 NOTE — ED Notes (Signed)
Pt desat to upper 80s while sleeping, pt not in any distress. Placed on 2L  for comfort.

## 2021-07-22 NOTE — Progress Notes (Signed)
Lower extremity venous has been completed.   Preliminary results in CV Proc.   [name] 07/22/2021 1:50 PM

## 2021-07-22 NOTE — ED Notes (Signed)
Pt states she is unable to walk at this time since last night. Pt lives alone. Pending SW consult.

## 2021-07-22 NOTE — ED Notes (Signed)
Pt is going home by [facility]. Pt is number 12 on the list. Pending transport back to home.

## 2021-07-22 NOTE — ED Notes (Signed)
Regular diet tray ordered for pt.

## 2021-07-23 DIAGNOSIS — Z8711 Personal history of peptic ulcer disease: Secondary | ICD-10-CM | POA: Diagnosis not present

## 2021-07-23 DIAGNOSIS — I129 Hypertensive chronic kidney disease with stage 1 through stage 4 chronic kidney disease, or unspecified chronic kidney disease: Secondary | ICD-10-CM | POA: Diagnosis not present

## 2021-07-23 DIAGNOSIS — D631 Anemia in chronic kidney disease: Secondary | ICD-10-CM | POA: Diagnosis not present

## 2021-07-23 DIAGNOSIS — E1140 Type 2 diabetes mellitus with diabetic neuropathy, unspecified: Secondary | ICD-10-CM | POA: Diagnosis not present

## 2021-07-23 DIAGNOSIS — E785 Hyperlipidemia, unspecified: Secondary | ICD-10-CM | POA: Diagnosis not present

## 2021-07-23 DIAGNOSIS — Z86010 Personal history of colonic polyps: Secondary | ICD-10-CM | POA: Diagnosis not present

## 2021-07-23 DIAGNOSIS — Z7984 Long term (current) use of oral hypoglycemic drugs: Secondary | ICD-10-CM | POA: Diagnosis not present

## 2021-07-23 DIAGNOSIS — I252 Old myocardial infarction: Secondary | ICD-10-CM | POA: Diagnosis not present

## 2021-07-23 DIAGNOSIS — K219 Gastro-esophageal reflux disease without esophagitis: Secondary | ICD-10-CM | POA: Diagnosis not present

## 2021-07-23 DIAGNOSIS — Z4789 Encounter for other orthopedic aftercare: Secondary | ICD-10-CM | POA: Diagnosis not present

## 2021-07-23 DIAGNOSIS — J449 Chronic obstructive pulmonary disease, unspecified: Secondary | ICD-10-CM | POA: Diagnosis not present

## 2021-07-23 DIAGNOSIS — E1122 Type 2 diabetes mellitus with diabetic chronic kidney disease: Secondary | ICD-10-CM | POA: Diagnosis not present

## 2021-07-23 DIAGNOSIS — N189 Chronic kidney disease, unspecified: Secondary | ICD-10-CM | POA: Diagnosis not present

## 2021-07-23 DIAGNOSIS — Z981 Arthrodesis status: Secondary | ICD-10-CM | POA: Diagnosis not present

## 2021-07-23 DIAGNOSIS — M47817 Spondylosis without myelopathy or radiculopathy, lumbosacral region: Secondary | ICD-10-CM | POA: Diagnosis not present

## 2021-07-23 DIAGNOSIS — Z7951 Long term (current) use of inhaled steroids: Secondary | ICD-10-CM | POA: Diagnosis not present

## 2021-07-23 DIAGNOSIS — E1159 Type 2 diabetes mellitus with other circulatory complications: Secondary | ICD-10-CM | POA: Diagnosis not present

## 2021-07-23 DIAGNOSIS — D509 Iron deficiency anemia, unspecified: Secondary | ICD-10-CM | POA: Diagnosis not present

## 2021-07-24 DIAGNOSIS — J449 Chronic obstructive pulmonary disease, unspecified: Secondary | ICD-10-CM | POA: Diagnosis not present

## 2021-07-24 DIAGNOSIS — N189 Chronic kidney disease, unspecified: Secondary | ICD-10-CM | POA: Diagnosis not present

## 2021-07-24 DIAGNOSIS — Z4789 Encounter for other orthopedic aftercare: Secondary | ICD-10-CM | POA: Diagnosis not present

## 2021-07-24 DIAGNOSIS — E1122 Type 2 diabetes mellitus with diabetic chronic kidney disease: Secondary | ICD-10-CM | POA: Diagnosis not present

## 2021-07-24 DIAGNOSIS — E785 Hyperlipidemia, unspecified: Secondary | ICD-10-CM | POA: Diagnosis not present

## 2021-07-24 DIAGNOSIS — Z7951 Long term (current) use of inhaled steroids: Secondary | ICD-10-CM | POA: Diagnosis not present

## 2021-07-24 DIAGNOSIS — I129 Hypertensive chronic kidney disease with stage 1 through stage 4 chronic kidney disease, or unspecified chronic kidney disease: Secondary | ICD-10-CM | POA: Diagnosis not present

## 2021-07-24 DIAGNOSIS — E1159 Type 2 diabetes mellitus with other circulatory complications: Secondary | ICD-10-CM | POA: Diagnosis not present

## 2021-07-24 DIAGNOSIS — Z8711 Personal history of peptic ulcer disease: Secondary | ICD-10-CM | POA: Diagnosis not present

## 2021-07-24 DIAGNOSIS — D631 Anemia in chronic kidney disease: Secondary | ICD-10-CM | POA: Diagnosis not present

## 2021-07-24 DIAGNOSIS — K219 Gastro-esophageal reflux disease without esophagitis: Secondary | ICD-10-CM | POA: Diagnosis not present

## 2021-07-24 DIAGNOSIS — I252 Old myocardial infarction: Secondary | ICD-10-CM | POA: Diagnosis not present

## 2021-07-24 DIAGNOSIS — Z86010 Personal history of colonic polyps: Secondary | ICD-10-CM | POA: Diagnosis not present

## 2021-07-24 DIAGNOSIS — Z7984 Long term (current) use of oral hypoglycemic drugs: Secondary | ICD-10-CM | POA: Diagnosis not present

## 2021-07-24 DIAGNOSIS — Z981 Arthrodesis status: Secondary | ICD-10-CM | POA: Diagnosis not present

## 2021-07-24 DIAGNOSIS — M47817 Spondylosis without myelopathy or radiculopathy, lumbosacral region: Secondary | ICD-10-CM | POA: Diagnosis not present

## 2021-07-24 DIAGNOSIS — D509 Iron deficiency anemia, unspecified: Secondary | ICD-10-CM | POA: Diagnosis not present

## 2021-07-24 DIAGNOSIS — E1140 Type 2 diabetes mellitus with diabetic neuropathy, unspecified: Secondary | ICD-10-CM | POA: Diagnosis not present

## 2021-07-29 DIAGNOSIS — S82842D Displaced bimalleolar fracture of left lower leg, subsequent encounter for closed fracture with routine healing: Secondary | ICD-10-CM | POA: Diagnosis not present

## 2021-07-29 DIAGNOSIS — M19072 Primary osteoarthritis, left ankle and foot: Secondary | ICD-10-CM | POA: Diagnosis not present

## 2021-07-30 DIAGNOSIS — D631 Anemia in chronic kidney disease: Secondary | ICD-10-CM | POA: Diagnosis not present

## 2021-07-30 DIAGNOSIS — M47817 Spondylosis without myelopathy or radiculopathy, lumbosacral region: Secondary | ICD-10-CM | POA: Diagnosis not present

## 2021-07-30 DIAGNOSIS — Z7984 Long term (current) use of oral hypoglycemic drugs: Secondary | ICD-10-CM | POA: Diagnosis not present

## 2021-07-30 DIAGNOSIS — I252 Old myocardial infarction: Secondary | ICD-10-CM | POA: Diagnosis not present

## 2021-07-30 DIAGNOSIS — K219 Gastro-esophageal reflux disease without esophagitis: Secondary | ICD-10-CM | POA: Diagnosis not present

## 2021-07-30 DIAGNOSIS — E1140 Type 2 diabetes mellitus with diabetic neuropathy, unspecified: Secondary | ICD-10-CM | POA: Diagnosis not present

## 2021-07-30 DIAGNOSIS — Z8711 Personal history of peptic ulcer disease: Secondary | ICD-10-CM | POA: Diagnosis not present

## 2021-07-30 DIAGNOSIS — Z981 Arthrodesis status: Secondary | ICD-10-CM | POA: Diagnosis not present

## 2021-07-30 DIAGNOSIS — E1122 Type 2 diabetes mellitus with diabetic chronic kidney disease: Secondary | ICD-10-CM | POA: Diagnosis not present

## 2021-07-30 DIAGNOSIS — Z4789 Encounter for other orthopedic aftercare: Secondary | ICD-10-CM | POA: Diagnosis not present

## 2021-07-30 DIAGNOSIS — Z7951 Long term (current) use of inhaled steroids: Secondary | ICD-10-CM | POA: Diagnosis not present

## 2021-07-30 DIAGNOSIS — I129 Hypertensive chronic kidney disease with stage 1 through stage 4 chronic kidney disease, or unspecified chronic kidney disease: Secondary | ICD-10-CM | POA: Diagnosis not present

## 2021-07-30 DIAGNOSIS — J449 Chronic obstructive pulmonary disease, unspecified: Secondary | ICD-10-CM | POA: Diagnosis not present

## 2021-07-30 DIAGNOSIS — E1159 Type 2 diabetes mellitus with other circulatory complications: Secondary | ICD-10-CM | POA: Diagnosis not present

## 2021-07-30 DIAGNOSIS — N189 Chronic kidney disease, unspecified: Secondary | ICD-10-CM | POA: Diagnosis not present

## 2021-07-30 DIAGNOSIS — Z86010 Personal history of colonic polyps: Secondary | ICD-10-CM | POA: Diagnosis not present

## 2021-07-30 DIAGNOSIS — E785 Hyperlipidemia, unspecified: Secondary | ICD-10-CM | POA: Diagnosis not present

## 2021-07-30 DIAGNOSIS — D509 Iron deficiency anemia, unspecified: Secondary | ICD-10-CM | POA: Diagnosis not present

## 2021-07-31 ENCOUNTER — Inpatient Hospital Stay (HOSPITAL_COMMUNITY): Payer: Medicare Other

## 2021-07-31 ENCOUNTER — Emergency Department (HOSPITAL_COMMUNITY): Payer: Medicare Other

## 2021-07-31 ENCOUNTER — Encounter (HOSPITAL_COMMUNITY): Payer: Self-pay | Admitting: Emergency Medicine

## 2021-07-31 ENCOUNTER — Other Ambulatory Visit: Payer: Self-pay

## 2021-07-31 ENCOUNTER — Inpatient Hospital Stay (HOSPITAL_COMMUNITY)
Admission: EM | Admit: 2021-07-31 | Discharge: 2021-08-08 | DRG: 493 | Disposition: A | Discharge status: Skilled Nursing Facility | Payer: Medicare Other | Attending: Internal Medicine | Admitting: Internal Medicine

## 2021-07-31 DIAGNOSIS — K219 Gastro-esophageal reflux disease without esophagitis: Secondary | ICD-10-CM | POA: Diagnosis not present

## 2021-07-31 DIAGNOSIS — S82235A Nondisplaced oblique fracture of shaft of left tibia, initial encounter for closed fracture: Secondary | ICD-10-CM | POA: Diagnosis not present

## 2021-07-31 DIAGNOSIS — I2510 Atherosclerotic heart disease of native coronary artery without angina pectoris: Secondary | ICD-10-CM | POA: Diagnosis not present

## 2021-07-31 DIAGNOSIS — J449 Chronic obstructive pulmonary disease, unspecified: Secondary | ICD-10-CM | POA: Diagnosis present

## 2021-07-31 DIAGNOSIS — Z8616 Personal history of COVID-19: Secondary | ICD-10-CM | POA: Diagnosis not present

## 2021-07-31 DIAGNOSIS — S82899A Other fracture of unspecified lower leg, initial encounter for closed fracture: Secondary | ICD-10-CM | POA: Diagnosis present

## 2021-07-31 DIAGNOSIS — L03116 Cellulitis of left lower limb: Secondary | ICD-10-CM | POA: Diagnosis present

## 2021-07-31 DIAGNOSIS — Z888 Allergy status to other drugs, medicaments and biological substances status: Secondary | ICD-10-CM

## 2021-07-31 DIAGNOSIS — G4730 Sleep apnea, unspecified: Secondary | ICD-10-CM | POA: Diagnosis present

## 2021-07-31 DIAGNOSIS — F419 Anxiety disorder, unspecified: Secondary | ICD-10-CM | POA: Diagnosis present

## 2021-07-31 DIAGNOSIS — Z20822 Contact with and (suspected) exposure to covid-19: Secondary | ICD-10-CM | POA: Diagnosis present

## 2021-07-31 DIAGNOSIS — I25119 Atherosclerotic heart disease of native coronary artery with unspecified angina pectoris: Secondary | ICD-10-CM | POA: Diagnosis not present

## 2021-07-31 DIAGNOSIS — D62 Acute posthemorrhagic anemia: Secondary | ICD-10-CM | POA: Diagnosis not present

## 2021-07-31 DIAGNOSIS — S82892A Other fracture of left lower leg, initial encounter for closed fracture: Secondary | ICD-10-CM

## 2021-07-31 DIAGNOSIS — Z794 Long term (current) use of insulin: Secondary | ICD-10-CM | POA: Diagnosis not present

## 2021-07-31 DIAGNOSIS — M9722XA Periprosthetic fracture around internal prosthetic left ankle joint, initial encounter: Principal | ICD-10-CM | POA: Diagnosis present

## 2021-07-31 DIAGNOSIS — S82292A Other fracture of shaft of left tibia, initial encounter for closed fracture: Secondary | ICD-10-CM

## 2021-07-31 DIAGNOSIS — E1140 Type 2 diabetes mellitus with diabetic neuropathy, unspecified: Secondary | ICD-10-CM | POA: Diagnosis present

## 2021-07-31 DIAGNOSIS — X501XXA Overexertion from prolonged static or awkward postures, initial encounter: Secondary | ICD-10-CM | POA: Diagnosis not present

## 2021-07-31 DIAGNOSIS — Z818 Family history of other mental and behavioral disorders: Secondary | ICD-10-CM | POA: Diagnosis not present

## 2021-07-31 DIAGNOSIS — F17210 Nicotine dependence, cigarettes, uncomplicated: Secondary | ICD-10-CM | POA: Diagnosis not present

## 2021-07-31 DIAGNOSIS — F329 Major depressive disorder, single episode, unspecified: Secondary | ICD-10-CM | POA: Diagnosis present

## 2021-07-31 DIAGNOSIS — T148XXA Other injury of unspecified body region, initial encounter: Secondary | ICD-10-CM

## 2021-07-31 DIAGNOSIS — I130 Hypertensive heart and chronic kidney disease with heart failure and stage 1 through stage 4 chronic kidney disease, or unspecified chronic kidney disease: Secondary | ICD-10-CM | POA: Diagnosis not present

## 2021-07-31 DIAGNOSIS — I5032 Chronic diastolic (congestive) heart failure: Secondary | ICD-10-CM | POA: Diagnosis not present

## 2021-07-31 DIAGNOSIS — Z9851 Tubal ligation status: Secondary | ICD-10-CM

## 2021-07-31 DIAGNOSIS — Z6841 Body Mass Index (BMI) 40.0 and over, adult: Secondary | ICD-10-CM

## 2021-07-31 DIAGNOSIS — R600 Localized edema: Secondary | ICD-10-CM | POA: Diagnosis not present

## 2021-07-31 DIAGNOSIS — I10 Essential (primary) hypertension: Secondary | ICD-10-CM | POA: Diagnosis not present

## 2021-07-31 DIAGNOSIS — E11610 Type 2 diabetes mellitus with diabetic neuropathic arthropathy: Secondary | ICD-10-CM | POA: Diagnosis not present

## 2021-07-31 DIAGNOSIS — M6281 Muscle weakness (generalized): Secondary | ICD-10-CM | POA: Diagnosis not present

## 2021-07-31 DIAGNOSIS — E1122 Type 2 diabetes mellitus with diabetic chronic kidney disease: Secondary | ICD-10-CM | POA: Diagnosis not present

## 2021-07-31 DIAGNOSIS — L89622 Pressure ulcer of left heel, stage 2: Secondary | ICD-10-CM | POA: Diagnosis present

## 2021-07-31 DIAGNOSIS — Z981 Arthrodesis status: Secondary | ICD-10-CM | POA: Diagnosis not present

## 2021-07-31 DIAGNOSIS — Y9301 Activity, walking, marching and hiking: Secondary | ICD-10-CM | POA: Diagnosis present

## 2021-07-31 DIAGNOSIS — K5900 Constipation, unspecified: Secondary | ICD-10-CM | POA: Diagnosis present

## 2021-07-31 DIAGNOSIS — L97821 Non-pressure chronic ulcer of other part of left lower leg limited to breakdown of skin: Secondary | ICD-10-CM | POA: Diagnosis not present

## 2021-07-31 DIAGNOSIS — R262 Difficulty in walking, not elsewhere classified: Secondary | ICD-10-CM | POA: Diagnosis not present

## 2021-07-31 DIAGNOSIS — Z743 Need for continuous supervision: Secondary | ICD-10-CM | POA: Diagnosis not present

## 2021-07-31 DIAGNOSIS — Z833 Family history of diabetes mellitus: Secondary | ICD-10-CM

## 2021-07-31 DIAGNOSIS — G8918 Other acute postprocedural pain: Secondary | ICD-10-CM | POA: Diagnosis not present

## 2021-07-31 DIAGNOSIS — R059 Cough, unspecified: Secondary | ICD-10-CM | POA: Diagnosis not present

## 2021-07-31 DIAGNOSIS — Z1159 Encounter for screening for other viral diseases: Secondary | ICD-10-CM | POA: Diagnosis not present

## 2021-07-31 DIAGNOSIS — R0902 Hypoxemia: Secondary | ICD-10-CM | POA: Diagnosis not present

## 2021-07-31 DIAGNOSIS — Z881 Allergy status to other antibiotic agents status: Secondary | ICD-10-CM

## 2021-07-31 DIAGNOSIS — Z86010 Personal history of colonic polyps: Secondary | ICD-10-CM

## 2021-07-31 DIAGNOSIS — Z4889 Encounter for other specified surgical aftercare: Secondary | ICD-10-CM | POA: Diagnosis not present

## 2021-07-31 DIAGNOSIS — Z8711 Personal history of peptic ulcer disease: Secondary | ICD-10-CM

## 2021-07-31 DIAGNOSIS — Z7984 Long term (current) use of oral hypoglycemic drugs: Secondary | ICD-10-CM

## 2021-07-31 DIAGNOSIS — S82201A Unspecified fracture of shaft of right tibia, initial encounter for closed fracture: Principal | ICD-10-CM

## 2021-07-31 DIAGNOSIS — I252 Old myocardial infarction: Secondary | ICD-10-CM

## 2021-07-31 DIAGNOSIS — E1165 Type 2 diabetes mellitus with hyperglycemia: Secondary | ICD-10-CM | POA: Diagnosis not present

## 2021-07-31 DIAGNOSIS — M7989 Other specified soft tissue disorders: Secondary | ICD-10-CM | POA: Diagnosis not present

## 2021-07-31 DIAGNOSIS — Z7401 Bed confinement status: Secondary | ICD-10-CM | POA: Diagnosis not present

## 2021-07-31 DIAGNOSIS — N1832 Chronic kidney disease, stage 3b: Secondary | ICD-10-CM | POA: Diagnosis not present

## 2021-07-31 DIAGNOSIS — S82392A Other fracture of lower end of left tibia, initial encounter for closed fracture: Secondary | ICD-10-CM | POA: Diagnosis not present

## 2021-07-31 DIAGNOSIS — D508 Other iron deficiency anemias: Secondary | ICD-10-CM | POA: Diagnosis not present

## 2021-07-31 DIAGNOSIS — R6889 Other general symptoms and signs: Secondary | ICD-10-CM | POA: Diagnosis not present

## 2021-07-31 DIAGNOSIS — L0390 Cellulitis, unspecified: Secondary | ICD-10-CM

## 2021-07-31 DIAGNOSIS — M9712XA Periprosthetic fracture around internal prosthetic left knee joint, initial encounter: Secondary | ICD-10-CM | POA: Diagnosis not present

## 2021-07-31 DIAGNOSIS — F418 Other specified anxiety disorders: Secondary | ICD-10-CM | POA: Diagnosis present

## 2021-07-31 DIAGNOSIS — Z9989 Dependence on other enabling machines and devices: Secondary | ICD-10-CM | POA: Diagnosis not present

## 2021-07-31 DIAGNOSIS — S82232D Displaced oblique fracture of shaft of left tibia, subsequent encounter for closed fracture with routine healing: Secondary | ICD-10-CM | POA: Diagnosis not present

## 2021-07-31 DIAGNOSIS — Z8249 Family history of ischemic heart disease and other diseases of the circulatory system: Secondary | ICD-10-CM

## 2021-07-31 DIAGNOSIS — E785 Hyperlipidemia, unspecified: Secondary | ICD-10-CM | POA: Diagnosis present

## 2021-07-31 DIAGNOSIS — J810 Acute pulmonary edema: Secondary | ICD-10-CM | POA: Diagnosis not present

## 2021-07-31 DIAGNOSIS — S82222A Displaced transverse fracture of shaft of left tibia, initial encounter for closed fracture: Secondary | ICD-10-CM | POA: Diagnosis not present

## 2021-07-31 DIAGNOSIS — Z79899 Other long term (current) drug therapy: Secondary | ICD-10-CM

## 2021-07-31 DIAGNOSIS — E46 Unspecified protein-calorie malnutrition: Secondary | ICD-10-CM | POA: Diagnosis not present

## 2021-07-31 DIAGNOSIS — S82202A Unspecified fracture of shaft of left tibia, initial encounter for closed fracture: Secondary | ICD-10-CM | POA: Diagnosis not present

## 2021-07-31 DIAGNOSIS — Z419 Encounter for procedure for purposes other than remedying health state, unspecified: Secondary | ICD-10-CM

## 2021-07-31 DIAGNOSIS — M19072 Primary osteoarthritis, left ankle and foot: Secondary | ICD-10-CM | POA: Diagnosis not present

## 2021-07-31 DIAGNOSIS — Z9889 Other specified postprocedural states: Secondary | ICD-10-CM | POA: Diagnosis not present

## 2021-07-31 DIAGNOSIS — R051 Acute cough: Secondary | ICD-10-CM | POA: Diagnosis not present

## 2021-07-31 LAB — BASIC METABOLIC PANEL
Anion gap: 11 (ref 5–15)
BUN: 12 mg/dL (ref 6–20)
CO2: 24 mmol/L (ref 22–32)
Calcium: 8.8 mg/dL — ABNORMAL LOW (ref 8.9–10.3)
Chloride: 98 mmol/L (ref 98–111)
Creatinine, Ser: 1.05 mg/dL — ABNORMAL HIGH (ref 0.44–1.00)
GFR, Estimated: >60 mL/min (ref >60)
Glucose, Bld: 83 mg/dL (ref 70–99)
Potassium: 4.3 mmol/L (ref 3.5–5.1)
Sodium: 133 mmol/L — ABNORMAL LOW (ref 135–145)

## 2021-07-31 LAB — CBC WITH DIFFERENTIAL/PLATELET
Abs Immature Granulocytes: 0.08 10*3/uL — ABNORMAL HIGH (ref 0.00–0.07)
Basophils Absolute: 0.1 10*3/uL (ref 0.0–0.1)
Basophils Relative: 0 %
Eosinophils Absolute: 0.4 10*3/uL (ref 0.0–0.5)
Eosinophils Relative: 3 %
HCT: 40.3 % (ref 36.0–46.0)
Hemoglobin: 12.2 g/dL (ref 12.0–15.0)
Immature Granulocytes: 1 %
Lymphocytes Relative: 16 %
Lymphs Abs: 2.1 10*3/uL (ref 0.7–4.0)
MCH: 28.3 pg (ref 26.0–34.0)
MCHC: 30.3 g/dL (ref 30.0–36.0)
MCV: 93.5 fL (ref 80.0–100.0)
Monocytes Absolute: 1 10*3/uL (ref 0.1–1.0)
Monocytes Relative: 8 %
Neutro Abs: 9.6 10*3/uL — ABNORMAL HIGH (ref 1.7–7.7)
Neutrophils Relative %: 72 %
Platelets: 270 10*3/uL (ref 150–400)
RBC: 4.31 MIL/uL (ref 3.87–5.11)
RDW: 14.6 % (ref 11.5–15.5)
WBC: 13.2 10*3/uL — ABNORMAL HIGH (ref 4.0–10.5)
nRBC: 0 % (ref 0.0–0.2)

## 2021-07-31 LAB — RESP PANEL BY RT-PCR (FLU A&B, COVID) ARPGX2
Influenza A by PCR: NEGATIVE
Influenza B by PCR: NEGATIVE
SARS Coronavirus 2 by RT PCR: NEGATIVE

## 2021-07-31 LAB — CBG MONITORING, ED: Glucose-Capillary: 85 mg/dL (ref 70–99)

## 2021-07-31 MED ORDER — LURASIDONE HCL 40 MG PO TABS
40.0000 mg | ORAL_TABLET | Freq: Every day | ORAL | Status: DC
  Administered 2021-07-31 – 2021-08-08 (×8): 40 mg via ORAL
  Filled 2021-07-31 (×10): qty 1

## 2021-07-31 MED ORDER — CLINDAMYCIN PHOSPHATE 600 MG/50ML IV SOLN
600.0000 mg | Freq: Three times a day (TID) | INTRAVENOUS | Status: DC
  Administered 2021-08-01 – 2021-08-03 (×7): 600 mg via INTRAVENOUS
  Filled 2021-07-31 (×9): qty 50

## 2021-07-31 MED ORDER — OXYCODONE HCL 5 MG PO TABS
5.0000 mg | ORAL_TABLET | Freq: Four times a day (QID) | ORAL | Status: DC | PRN
  Administered 2021-07-31: 5 mg via ORAL
  Filled 2021-07-31: qty 1

## 2021-07-31 MED ORDER — ATORVASTATIN CALCIUM 10 MG PO TABS
10.0000 mg | ORAL_TABLET | Freq: Every day | ORAL | Status: DC
  Administered 2021-07-31 – 2021-08-08 (×8): 10 mg via ORAL
  Filled 2021-07-31 (×8): qty 1

## 2021-07-31 MED ORDER — PANTOPRAZOLE SODIUM 40 MG PO TBEC
40.0000 mg | DELAYED_RELEASE_TABLET | Freq: Every day | ORAL | Status: DC
  Administered 2021-07-31 – 2021-08-08 (×8): 40 mg via ORAL
  Filled 2021-07-31 (×8): qty 1

## 2021-07-31 MED ORDER — POLYETHYLENE GLYCOL 3350 17 G PO PACK: 17.0000 g | PACK | Freq: Every day | ORAL | Status: DC | PRN

## 2021-07-31 MED ORDER — AMLODIPINE BESYLATE 10 MG PO TABS
10.0000 mg | ORAL_TABLET | Freq: Every day | ORAL | Status: DC
  Administered 2021-07-31 – 2021-08-08 (×8): 10 mg via ORAL
  Filled 2021-07-31: qty 1
  Filled 2021-07-31: qty 2
  Filled 2021-07-31 (×6): qty 1

## 2021-07-31 MED ORDER — INSULIN GLARGINE-YFGN 100 UNIT/ML ~~LOC~~ SOLN: 60.0000 [IU] | Freq: Two times a day (BID) | SUBCUTANEOUS | Status: DC

## 2021-07-31 MED ORDER — DULOXETINE HCL 30 MG PO CPEP
90.0000 mg | ORAL_CAPSULE | Freq: Every day | ORAL | Status: DC
  Administered 2021-08-01 – 2021-08-08 (×8): 90 mg via ORAL
  Filled 2021-07-31 (×10): qty 3

## 2021-07-31 MED ORDER — SENNOSIDES-DOCUSATE SODIUM 8.6-50 MG PO TABS: 1.0000 | ORAL_TABLET | Freq: Every evening | ORAL | Status: DC | PRN

## 2021-07-31 MED ORDER — PREGABALIN 75 MG PO CAPS
225.0000 mg | ORAL_CAPSULE | Freq: Three times a day (TID) | ORAL | Status: DC
  Administered 2021-07-31 – 2021-08-08 (×23): 225 mg via ORAL
  Filled 2021-07-31: qty 3
  Filled 2021-07-31: qty 2
  Filled 2021-07-31 (×21): qty 3

## 2021-07-31 MED ORDER — DEXTROSE 50 % IV SOLN
12.5000 g | INTRAVENOUS | Status: DC | PRN
  Administered 2021-08-01: 12.5 g via INTRAVENOUS
  Filled 2021-07-31: qty 50

## 2021-07-31 MED ORDER — TRAZODONE HCL 50 MG PO TABS
200.0000 mg | ORAL_TABLET | Freq: Every day | ORAL | Status: DC
  Administered 2021-07-31 – 2021-08-08 (×9): 200 mg via ORAL
  Filled 2021-07-31 (×9): qty 4

## 2021-07-31 MED ORDER — BUPROPION HCL ER (SR) 100 MG PO TB12
200.0000 mg | ORAL_TABLET | Freq: Two times a day (BID) | ORAL | Status: DC
  Administered 2021-07-31 – 2021-08-08 (×16): 200 mg via ORAL
  Filled 2021-07-31 (×20): qty 2

## 2021-07-31 MED ORDER — INSULIN ASPART 100 UNIT/ML IJ SOLN
0.0000 [IU] | INTRAMUSCULAR | Status: DC
  Administered 2021-08-01: 1 [IU] via SUBCUTANEOUS
  Administered 2021-08-01: 3 [IU] via SUBCUTANEOUS
  Administered 2021-08-02: 2 [IU] via SUBCUTANEOUS
  Administered 2021-08-02 (×3): 3 [IU] via SUBCUTANEOUS
  Administered 2021-08-02: 2 [IU] via SUBCUTANEOUS
  Administered 2021-08-02: 3 [IU] via SUBCUTANEOUS
  Administered 2021-08-03: 2 [IU] via SUBCUTANEOUS
  Administered 2021-08-03: 1 [IU] via SUBCUTANEOUS
  Administered 2021-08-03: 3 [IU] via SUBCUTANEOUS
  Administered 2021-08-03: 1 [IU] via SUBCUTANEOUS
  Administered 2021-08-03 (×2): 3 [IU] via SUBCUTANEOUS
  Administered 2021-08-04 (×4): 2 [IU] via SUBCUTANEOUS

## 2021-07-31 MED ORDER — DULOXETINE HCL 30 MG PO CPEP
30.0000 mg | ORAL_CAPSULE | Freq: Every day | ORAL | Status: DC
  Filled 2021-07-31: qty 1

## 2021-07-31 MED ORDER — MORPHINE SULFATE (PF) 2 MG/ML IV SOLN
1.0000 mg | INTRAVENOUS | Status: DC | PRN
Start: 2021-07-31 — End: 2021-08-01
  Administered 2021-07-31 – 2021-08-01 (×2): 1 mg via INTRAVENOUS
  Filled 2021-07-31 (×2): qty 1

## 2021-07-31 MED ORDER — METOPROLOL TARTRATE 50 MG PO TABS
50.0000 mg | ORAL_TABLET | Freq: Two times a day (BID) | ORAL | Status: DC
  Administered 2021-07-31 – 2021-08-08 (×17): 50 mg via ORAL
  Filled 2021-07-31 (×6): qty 1
  Filled 2021-07-31: qty 2
  Filled 2021-07-31 (×3): qty 1
  Filled 2021-07-31: qty 2
  Filled 2021-07-31 (×6): qty 1

## 2021-07-31 MED ORDER — QUETIAPINE FUMARATE 100 MG PO TABS
100.0000 mg | ORAL_TABLET | Freq: Every day | ORAL | Status: DC
  Administered 2021-07-31 – 2021-08-08 (×9): 100 mg via ORAL
  Filled 2021-07-31 (×9): qty 1

## 2021-07-31 MED ORDER — CLINDAMYCIN PHOSPHATE 600 MG/50ML IV SOLN
600.0000 mg | Freq: Once | INTRAVENOUS | Status: AC
  Administered 2021-07-31: 600 mg via INTRAVENOUS
  Filled 2021-07-31: qty 50

## 2021-07-31 MED ORDER — HYDRALAZINE HCL 25 MG PO TABS
25.0000 mg | ORAL_TABLET | Freq: Three times a day (TID) | ORAL | Status: DC
  Administered 2021-07-31 – 2021-08-08 (×24): 25 mg via ORAL
  Filled 2021-07-31 (×24): qty 1

## 2021-07-31 MED ORDER — OXYCODONE-ACETAMINOPHEN 5-325 MG PO TABS
2.0000 | ORAL_TABLET | Freq: Once | ORAL | Status: AC
Start: 2021-07-31 — End: 2021-07-31
  Administered 2021-07-31: 2 via ORAL
  Filled 2021-07-31: qty 2

## 2021-07-31 MED ORDER — MOMETASONE FURO-FORMOTEROL FUM 100-5 MCG/ACT IN AERO
2.0000 | INHALATION_SPRAY | Freq: Two times a day (BID) | RESPIRATORY_TRACT | Status: DC
  Administered 2021-08-01 – 2021-08-08 (×15): 2 via RESPIRATORY_TRACT
  Filled 2021-07-31 (×2): qty 8.8

## 2021-07-31 MED ORDER — POLYETHYLENE GLYCOL 3350 17 G PO PACK
17.0000 g | PACK | Freq: Every day | ORAL | Status: DC
  Administered 2021-08-02 – 2021-08-08 (×4): 17 g via ORAL
  Filled 2021-07-31 (×6): qty 1

## 2021-07-31 MED ORDER — ALBUTEROL SULFATE (2.5 MG/3ML) 0.083% IN NEBU: 2.5000 mg | INHALATION_SOLUTION | RESPIRATORY_TRACT | Status: DC | PRN

## 2021-07-31 NOTE — H&P (Signed)
History and Physical    [name] YBO:[id number] DOB: [birth year] DOA: 07/31/2021  PCP: Wilmer Floor., MD Patient coming from: Home  Chief Complaint: Left ankle pain  HPI: [name] is a 60 y.o. female with medical history significant of mild nonobstructive CAD per LHC done in August 2021, chronic diastolic CHF, morbid obesity (BMI 56.49, COPD, insulin-dependent type 2 diabetes, neuropathy, hypertension, anxiety, depression, hyperlipidemia, anemia.  Admitted in August 2021 and underwent surgery for left bimalleolar ankle fracture.  Admitted again in September 2022 for left ankle fusion and hardware removal surgery.  Seen in the ED on 07/22/2021 for left lower extremity pain -x-rays negative for acute fracture or dislocation, DVT study negative.  She presents to the ED today complaining of left ankle pain.  Labs showing WBC 13.2, hemoglobin 12.2, platelet count 270k.  Sodium 133, potassium 4.3, chloride 98, bicarb 24, BUN 12, creatinine 1.0, glucose 83.  COVID and influenza PCR negative.  X-ray of left ankle showing acute fracture of the distal tibia at the level of the proximal intramedullary nail.  CT showing left ankle acute perihardware fracture.  Also showing soft tissue thickening surrounding the tibiotalar joint which could reflect fibrotic changes secondary to surgery or possibly underlying infection.  In addition, circumferential soft tissue swelling about the lower leg and foot without organized fluid collection or soft tissue gas.  Patient noted to have signs of cellulitis of the left lower extremity.  Repeat DVT study negative. Patient was given clindamycin and Percocet.  Orthopedics consulted -ORIF with Dr. Jena Gauss tomorrow morning.  Patient states she has hurt her left ankle twice in the past 1.5 weeks.  No fall reported.  She thinks it happened because she was bearing weight on her leg.  States she is supposed to be nonweightbearing but does not have any help at home as she  lives alone and has to ambulate.  She is having severe pain in this ankle.  States normally her blood glucose is high in the 200s.  Her doctor increased the dose of basal insulin from 80 units twice daily to 90 units twice daily and she thinks her blood glucose is too low now, below 100 consistently which is not normal for her.  At one time her blood glucose was less than 70.  She currently feels nauseous as she has not had any food to eat all day.  No vomiting or abdominal pain.  She is constipated, last bowel movement 3 days ago.  Also having a new cough for the past few days.  No shortness of breath.  She smokes cigarettes.  In addition, patient is endorsing substernal chest pain, unclear when it started.  Review of Systems:  All systems reviewed and apart from history of presenting illness, are negative.  Past Medical History:  Diagnosis Date   Anemia    iron deficiency   Anxiety    Arthritis    Chronic kidney disease    COPD (chronic obstructive pulmonary disease) (HCC)    Depression    Diabetes mellitus    type 2   Diabetic foot ulcer (HCC) 04/2018   right ankle   Dyspnea    GERD (gastroesophageal reflux disease)    Headache(784.0)    History of colonic polyps    Hyperlipidemia    Hypertension    Myocardial infarction (HCC)    Neuropathy    Peptic ulcer disease    Sleep apnea     Past Surgical History:  Procedure Laterality Date  ANKLE FUSION Left 06/11/2021   Procedure: ANKLE FUSION;  Surgeon: Roby Lofts, MD;  Location: [facility] OR;  Service: Orthopedics;  Laterality: Left;   ESOPHAGOGASTRODUODENOSCOPY  10/11/2011   Procedure: ESOPHAGOGASTRODUODENOSCOPY (EGD);  Surgeon: Erick Blinks, MD;  Location: [facility] ENDOSCOPY;  Service: Gastroenterology;  Laterality: N/A;   ORIF ANKLE FRACTURE Left 05/29/2020   Procedure: OPEN REDUCTION INTERNAL FIXATION (ORIF) ANKLE FRACTURE;  Surgeon: Roby Lofts, MD;  Location: [facility] OR;  Service: Orthopedics;  Laterality: Left;   TUBAL LIGATION        reports that she has been smoking cigarettes. She has a 25.00 pack-year smoking history. She has never used smokeless tobacco. She reports that she does not drink alcohol and does not use drugs.  Allergies  Allergen Reactions   Ceftriaxone Diarrhea and Nausea And Vomiting    Family History  Problem Relation Age of Onset   Alcohol abuse Other    Drug abuse Other    Arthritis Other    Diabetes Other    Depression Other    Hypertension Other    Kidney disease Other    Cancer Other        Breast,Colon,Lung, and Prostate Cancer   Stomach cancer Father 33   Heart attack Brother 55   Diabetes type II Other        brother, both parents    Prior to Admission medications   Medication Sig Start Date End Date Taking? Authorizing Provider  acetaminophen (TYLENOL) 500 MG tablet Take 1,000 mg by mouth every 6 (six) hours as needed for moderate pain.   Yes [provider]  amLODipine (NORVASC) 10 MG tablet Take 10 mg by mouth daily. 05/22/20  Yes [provider]  atorvastatin (LIPITOR) 10 MG tablet Take 10 mg by mouth daily. 05/05/20  Yes [provider]  buPROPion (WELLBUTRIN SR) 200 MG 12 hr tablet Take 200 mg by mouth 2 (two) times daily. 04/16/18  Yes [provider]  DULoxetine (CYMBALTA) 30 MG capsule Take 30 mg by mouth daily. 04/16/18  Yes [provider]  DULoxetine (CYMBALTA) 60 MG capsule Take 60 mg by mouth daily.    Yes [provider]  HUMALOG KWIKPEN 100 UNIT/ML KiwkPen Inject 12-20 Units as directed in the morning and at bedtime. Per sliding scale 02/10/18  Yes [provider]  hydrALAZINE (APRESOLINE) 25 MG tablet Take 25 mg by mouth 3 (three) times daily.   Yes [provider]  insulin glargine (LANTUS) 100 UNIT/ML injection Inject 0.6 mLs (60 Units total) into the skin 2 (two) times daily. Patient taking differently: Inject 100 Units into the skin 2 (two) times daily. 06/04/20  Yes Osvaldo Shipper, MD  JARDIANCE 25  MG TABS tablet Take 25 mg by mouth daily. 05/22/20  Yes [provider]  lurasidone (LATUDA) 40 MG TABS tablet Take 40 mg by mouth daily. 04/16/18  Yes [provider]  LYRICA 225 MG capsule Take 225 mg by mouth 3 (three) times daily. 04/09/18  Yes [provider]  meloxicam (MOBIC) 7.5 MG tablet Take 7.5 mg by mouth at bedtime. 03/29/18  Yes [provider]  metoprolol tartrate (LOPRESSOR) 50 MG tablet Take 50 mg by mouth 2 (two) times daily. 02/10/18  Yes [provider]  Multiple Vitamin (MULTI-DAY VITAMINS PO) Take 1 tablet by mouth daily.   Yes [provider]  omeprazole (PRILOSEC) 40 MG capsule Take 40 mg by mouth daily. 03/19/18  Yes [provider]  Oxycodone HCl 10 MG TABS  Take 1 tablet (10 mg total) by mouth every 6 (six) hours as needed. Patient taking differently: Take 20 mg by mouth every 6 (six) hours as needed. 07/22/21  Yes Jacalyn Lefevre, MD  polyethylene glycol (MIRALAX / GLYCOLAX) 17 g packet Take 17 g by mouth daily as needed for mild constipation. 06/19/21  Yes Despina Hidden, PA-C  QUEtiapine (SEROQUEL) 100 MG tablet Take 100 mg by mouth at bedtime. 04/16/18  Yes [provider]  Semaglutide, 1 MG/DOSE, (OZEMPIC, 1 MG/DOSE,) 2 MG/1.5ML SOPN Inject 1 mg into the skin every 7 (seven) days. 03/05/20  Yes [provider]  SYMBICORT 80-4.5 MCG/ACT inhaler Inhale 2 puffs into the lungs 2 (two) times daily as needed (shortness of breath or wheezing). 03/19/18  Yes [provider]  traZODone (DESYREL) 100 MG tablet Take 200 mg by mouth at bedtime. 04/22/20  Yes [provider]  bisacodyl (DULCOLAX) 10 MG suppository Place 1 suppository (10 mg total) rectally daily as needed for moderate constipation. Patient not taking: No sig reported 06/19/21   Despina Hidden, PA-C  enoxaparin (LOVENOX) 80 MG/0.8ML injection Inject 0.8 mLs (80 mg total) into the skin daily for 21 days. 06/19/21 07/10/21  Despina Hidden, PA-C  oxyCODONE (OXY IR/ROXICODONE) 5 MG immediate release tablet Take 1-2 tablets (5-10 mg total) by mouth every 6 (six) hours as needed for breakthrough pain (breakthrough pain between Percocet doses). Patient not taking: No sig reported 06/19/21   Despina Hidden, PA-C  oxyCODONE-acetaminophen (PERCOCET) 10-325 MG tablet Take 1 tablet by mouth every 4 (four) hours as needed for pain. Patient not taking: No sig reported 06/19/21   Despina Hidden, PA-C    Physical Exam: Vitals:   07/31/21 1627 07/31/21 1630 07/31/21 1938 07/31/21 1944  BP: 131/65 (!) 145/64 (!) 163/59 (!) 163/59  Pulse: 92 95 (!) 101 97  Resp: (!) 22   20  Temp:      SpO2: 91% 95% 96% 95%  Weight:      Height:        Physical Exam Constitutional:      General: She is not in acute distress. HENT:     Head: Normocephalic and atraumatic.  Eyes:     Extraocular Movements: Extraocular movements intact.     Conjunctiva/sclera: Conjunctivae normal.  Cardiovascular:     Rate and Rhythm: Normal rate and regular rhythm.     Pulses: Normal pulses.  Pulmonary:     Effort: Pulmonary effort is normal. No respiratory distress.     Breath sounds: Normal breath sounds. No wheezing or rales.  Abdominal:     General: Bowel sounds are normal. There is no distension.     Palpations: Abdomen is soft.     Tenderness: There is no abdominal tenderness. There is no guarding or rebound.  Musculoskeletal:     Cervical back: Normal range of motion and neck supple.     Right lower leg: Edema present.     Left lower leg: Edema present.     Comments: Bilateral lower extremity edema L >>R Left lower extremity erythematous and warm to touch Dorsalis pedis pulse intact bilaterally  Skin:    General: Skin is warm and dry.  Neurological:     General: No focal deficit present.     Mental Status: She is alert and oriented to person, place, and time.     Labs on Admission: I have personally reviewed following labs and imaging  studies  CBC: Recent Labs  Lab 07/31/21 1754  WBC 13.2*  NEUTROABS 9.6*  HGB 12.2  HCT 40.3  MCV 93.5  PLT 270   Basic Metabolic Panel: Recent Labs  Lab 07/31/21 1754  NA 133*  K 4.3  CL 98  CO2 24  GLUCOSE 83  BUN 12  CREATININE 1.05*  CALCIUM 8.8*   GFR: Estimated Creatinine Clearance: 91.4 mL/min (A) (by C-G formula based on SCr of 1.05 mg/dL (H)). Liver Function Tests: No results for input(s): AST, ALT, ALKPHOS, BILITOT, PROT, ALBUMIN in the last 168 hours. No results for input(s): LIPASE, AMYLASE in the last 168 hours. No results for input(s): AMMONIA in the last 168 hours. Coagulation Profile: No results for input(s): INR, PROTIME in the last 168 hours. Cardiac Enzymes: No results for input(s): CKTOTAL, CKMB, CKMBINDEX, TROPONINI in the last 168 hours. BNP (last 3 results) No results for input(s): PROBNP in the last 8760 hours. HbA1C: No results for input(s): HGBA1C in the last 72 hours. CBG: No results for input(s): GLUCAP in the last 168 hours. Lipid Profile: No results for input(s): CHOL, HDL, LDLCALC, TRIG, CHOLHDL, LDLDIRECT in the last 72 hours. Thyroid Function Tests: No results for input(s): TSH, T4TOTAL, FREET4, T3FREE, THYROIDAB in the last 72 hours. Anemia Panel: No results for input(s): VITAMINB12, FOLATE, FERRITIN, TIBC, IRON, RETICCTPCT in the last 72 hours. Urine analysis:    Component Value Date/Time   COLORURINE YELLOW 10/08/2011 0746   APPEARANCEUR CLEAR 10/08/2011 0746   LABSPEC 1.038 (H) 10/08/2011 0746   PHURINE 5.5 10/08/2011 0746   GLUCOSEU >1000 (A) 10/08/2011 0746   GLUCOSEU >=1000 03/11/2011 1504   HGBUR SMALL (A) 10/08/2011 0746   BILIRUBINUR MODERATE (A) 10/08/2011 0746   KETONESUR >80 (A) 10/08/2011 0746   PROTEINUR NEGATIVE 10/08/2011 0746   UROBILINOGEN 0.2 10/08/2011 0746   NITRITE NEGATIVE 10/08/2011 0746   LEUKOCYTESUR NEGATIVE 10/08/2011 0746    Radiological Exams on Admission: DG Tibia/Fibula Left  Result  Date: 07/31/2021 CLINICAL DATA:  Fracture EXAM: LEFT TIBIA AND FIBULA - 2 VIEW COMPARISON:  07/31/2021 earlier today FINDINGS: Distal tibial intramedullary nail again noted extending through the talus, calcaneus and into the tibia. Oblique fracture noted in the distal tibial shaft just above the intramedullary nail. This is unchanged since prior study. Prior resection of distal fibula. IMPRESSION: No significant change in the alignment across the oblique fracture of the distal tibial shaft just above the intramedullary nail. Electronically Signed   By: Charlett Nose M.D.   On: 07/31/2021 19:00   DG Ankle Complete Left  Result Date: 07/31/2021 CLINICAL DATA:  Ankle injury. EXAM: LEFT ANKLE COMPLETE - 3+ VIEW COMPARISON:  Left ankle x-ray 06/11/2021. FINDINGS: Overlying cast obscures fine bony detail. There is a distal tibial intramedullary nail extending into the talus and calcaneus with multiple screws. There is new acute oblique fracture at the proximal tip of the intramedullary nail. Fracture fragments are distracted 3 mm. The hardware appears intact. Distal fibular resection appears unchanged. There is no gross dislocation. IMPRESSION: 1. Acute fracture of the distal tibia at the level of the proximal intramedullary nail. 2. Hardware otherwise appears unchanged. Electronically Signed   By: Darliss Cheney M.D.   On: 07/31/2021 17:24   CT TIBIA FIBULA LEFT WO CONTRAST  Result Date: 07/31/2021 CLINICAL DATA:  Evaluate left tibial fracture EXAM: CT OF THE LOWER LEFT EXTREMITY WITHOUT CONTRAST TECHNIQUE: Multidetector CT imaging of the lower left extremity was performed according to the standard protocol. COMPARISON:  X-ray 07/31/2021, 07/22/2021.  CT 01/09/2021 FINDINGS:  Bones/Joint/Cartilage Postsurgical changes from tibiotalocalcaneal fusion with retrograde nail and screw hardware. Acute perihardware fracture centered at the tip of the nail within the distal tibial diaphysis. There is minimal lateral  displacement at the fracture site. A nondisplaced oblique fracture component extends distally to the level of the distal metadiaphysis (series 13, image 55). Extensive postsurgical changes with evidence of prior hardware removal in the distal tibia and fibula with chronic fracture deformities. There is mild lucency surrounding the nail and screws, particularly within the calcaneus compatible with loosening. Marked bony fragmentation at the tibiotalar joint with collapse of the talar dome. The lateral malleolus has been resected. There is numerous densities within the resection bed likely bone cement or spacers. Advanced midfoot arthropathy is similar in appearance to prior. Ligaments Suboptimally assessed by CT. Muscles and Tendons Fatty atrophy of the lower leg musculature. Tendinous structures are not well evaluated, but appear grossly intact. Soft tissues Circumferential soft tissue swelling about the lower leg and foot. Prominent soft tissue thickening surrounds the tibiotalar joint. No organized fluid collection. No soft tissue gas. IMPRESSION: 1. Postsurgical changes from tibiotalocalcaneal fusion with retrograde nail and screw hardware. Acute perihardware fracture centered at the tip of the nail within the distal tibial diaphysis with minimal lateral displacement at the fracture site. A nondisplaced oblique fracture component extends distally to the level of the distal metadiaphysis. 2. Extensive postsurgical changes with evidence of prior hardware removal in the distal tibia and fibula with chronic fracture deformities. Marked bony fragmentation at the tibiotalar joint with collapse of the talar dome. Prominent soft tissue thickening surrounding the tibiotalar joint, which could reflect fibrotic changes secondary to surgery or possibly related to underlying infection. 3. Circumferential soft tissue swelling about the lower leg and foot without organized fluid collection or soft tissue gas. Electronically  Signed   By: Duanne Guess D.O.   On: 07/31/2021 20:04   VAS [place] LOWER EXTREMITY VENOUS (DVT) (ONLY [facility])  Result Date: 07/31/2021  Lower Venous DVT Study Patient Name:  SEREN CHALOUX Stracener  Date of Exam:   07/31/2021 Medical Rec #: [id number]      Accession #:    [id number] Date of Birth: 1961-06-22     Patient Gender: F Patient Age:   68 years Exam Location:  [facility] Procedure:      VAS [place] LOWER EXTREMITY VENOUS (DVT) Referring Phys: ABIGAIL HARRIS --------------------------------------------------------------------------------  Indications: New trauma to left ankle s/p ORIF.  Limitations: Body habitus. Comparison       07-22-2021 Bilateral lower extremity venous was negative for Study:           DVT. Performing Technologist: Jean Rosenthal RDMS, RVT  Examination Guidelines: A complete evaluation includes B-mode imaging, spectral Doppler, color Doppler, and power Doppler as needed of all accessible portions of each vessel. Bilateral testing is considered an integral part of a complete examination. Limited examinations for reoccurring indications may be performed as noted. The reflux portion of the exam is performed with the patient in reverse Trendelenburg.  +---------+---------------+---------+-----------+----------+--------------+ LEFT     CompressibilityPhasicitySpontaneityPropertiesThrombus Aging +---------+---------------+---------+-----------+----------+--------------+ CFV      Full           Yes      Yes                                 +---------+---------------+---------+-----------+----------+--------------+ SFJ      Full                                                        +---------+---------------+---------+-----------+----------+--------------+  FV Prox  Full                                                        +---------+---------------+---------+-----------+----------+--------------+ FV Mid   Full           Yes      Yes                                  +---------+---------------+---------+-----------+----------+--------------+ FV Distal               Yes      Yes                                 +---------+---------------+---------+-----------+----------+--------------+ PFV      Full                                                        +---------+---------------+---------+-----------+----------+--------------+ POP      Full           Yes      Yes                                 +---------+---------------+---------+-----------+----------+--------------+ PTV                     Yes      Yes                                 +---------+---------------+---------+-----------+----------+--------------+ PERO                    Yes      Yes                                 +---------+---------------+---------+-----------+----------+--------------+     Summary: LEFT: - There is no evidence of deep vein thrombosis in the lower extremity. However, portions of this examination were limited- see technologist comments above.  - No cystic structure found in the popliteal fossa.  *See table(s) above for measurements and observations.    Preliminary     Assessment/Plan Principal Problem:   Ankle fracture Active Problems:   Depression with anxiety   Essential hypertension   GERD   Cellulitis   Left ankle acute perihardware fracture -Orthopedics consulted -ORIF with Dr. Jena Gauss tomorrow morning.  Patient is currently endorsing cough and active chest pain, it is best to avoid surgery until further work-up is done (I have sent a message to Dr. Blanchie Dessert and Dr. Jena Gauss).  Keep n.p.o. after midnight.  Continue pain management: IV morphine prn severe pain, Oxy IR prn moderate pain.  Bowel regimen.  Continue home Lyrica.  Left lower extremity cellulitis Mild leukocytosis but no fever or signs of sepsis.  No organized fluid collection or soft tissue gas on CT.  DVT study negative. -Cephalosporin allergy listed.  Continue  clindamycin.   Continue to monitor WBC count.  Cough Patient is endorsing acute onset cough.  Lungs clear on exam and not hypoxic.  COVID and influenza PCR negative. -Preop chest x-ray ordered and currently pending.  Check BNP level.  Chest pain Mild nonobstructive CAD LHC done 05/17/2020 ([facility]) showing normal coronary arteries with minimal detectable atherosclerosis; normal LV systolic function, EF 60 to 65% with no regional wall motion abnormalities.  EKG showing sinus rhythm and T wave abnormalities in leads III and V6 which appear new compared to prior tracing from 05/28/2021.  Patient is a poor historian, endorsing substernal chest pain, unclear when it started. -Cardiac monitoring.  Stat high-sensitivity troponin x2 pending.  Echocardiogram ordered and currently pending.  Chronic diastolic CHF Appears volume overloaded with bilateral lower extremity edema. -Chest x-ray and BNP pending  COPD Not wheezing. -Dulera scheduled, albuterol prn  Insulin-dependent type 2 diabetes A1c 8.0 on 06/11/2021.  Blood glucose currently in the 80s.  Patient reports hypoglycemic event at home. -Hold home basal insulin at this time as she will be n.p.o. after midnight.  Order sliding scale insulin sensitive every 4 hours.  Hypoglycemia protocol.  Hypertension Blood pressure slightly elevated. -Continue amlodipine, hydralazine, metoprolol  Hyperlipidemia -Continue Lipitor  Mood disorder -Continue home bupropion, Cymbalta, Latuda, Seroquel, trazodone  GERD -Continue PPI  Constipation No vomiting.  Abdominal exam benign, abdomen soft and nontender, bowel sounds normal.  Taking opioids at home for pain. -Daily MiraLAX, Senokot-S as needed  DVT prophylaxis: SCDs Code Status: Full code Family Communication: No family available at this time. Disposition Plan: Status is: Inpatient  Remains inpatient appropriate because: Needs surgery for left ankle acute perihardware fracture.  Level of care: Level  of care: Med-Surg  The medical decision making on this patient was of high complexity and the patient is at high risk for clinical deterioration, therefore this is a level 3 visit.  John Giovanni MD [facility]  If 7PM-7AM, please contact night-coverage [email or web address]  07/31/2021, 9:34 PM

## 2021-07-31 NOTE — ED Notes (Signed)
Patient transported to CT 

## 2021-07-31 NOTE — ED Provider Notes (Addendum)
[facility] EMERGENCY DEPARTMENT Provider Note   CSN: [id number] Arrival date & time: 07/31/21  1614     History Chief Complaint  Patient presents with   Ankle Injury    [name] is a 60 y.o. female with recent Left ankle fusion with Dr. Jena Gauss who presents with left leg pan. Patient stepped onto the L leg in splint after loosing footing yesterday. Felt a snap and has had sever pain since. No new tingling or numbness.   Ankle Injury      Past Medical History:  Diagnosis Date   Anemia    iron deficiency   Anxiety    Arthritis    Chronic kidney disease    COPD (chronic obstructive pulmonary disease) (HCC)    Depression    Diabetes mellitus    type 2   Diabetic foot ulcer (HCC) 04/2018   right ankle   Dyspnea    GERD (gastroesophageal reflux disease)    Headache(784.0)    History of colonic polyps    Hyperlipidemia    Hypertension    Myocardial infarction (HCC)    Neuropathy    Peptic ulcer disease    Sleep apnea     Patient Active Problem List   Diagnosis Date Noted   Ankle fracture 07/31/2021   Cellulitis 07/31/2021   Arthritis of left ankle 06/11/2021   Closed bimalleolar fracture of left ankle 05/06/2021   Closed left ankle fracture 05/28/2020   Moderate protein malnutrition (HCC)    Adult BMI 50.0-59.9 kg/sq m (HCC)    Normocytic anemia 04/21/2018   Diabetic foot infection (HCC) 04/21/2018   Mild renal insufficiency 04/21/2018   Diabetic infection of right foot (HCC)    Preventative health care 04/14/2011   DYSMETABOLIC SYNDROME 12/08/2010   ULNAR NEUROPATHY, BILATERAL 10/01/2010   Insulin-requiring or dependent type II diabetes mellitus (HCC) 09/11/2010   HYPERLIPIDEMIA 09/11/2010   Morbid (severe) obesity due to excess calories (HCC) 09/11/2010   TOBACCO USE 09/11/2010   Depression with anxiety 09/11/2010   Essential hypertension 09/11/2010   GERD 09/11/2010   PEPTIC ULCER DISEASE 09/11/2010   COLONIC POLYPS, HX OF  09/11/2010   Lumbosacral spondylosis 02/27/2010    Past Surgical History:  Procedure Laterality Date   ANKLE FUSION Left 06/11/2021   Procedure: ANKLE FUSION;  Surgeon: Roby Lofts, MD;  Location: [facility] OR;  Service: Orthopedics;  Laterality: Left;   ESOPHAGOGASTRODUODENOSCOPY  10/11/2011   Procedure: ESOPHAGOGASTRODUODENOSCOPY (EGD);  Surgeon: Erick Blinks, MD;  Location: [facility] ENDOSCOPY;  Service: Gastroenterology;  Laterality: N/A;   ORIF ANKLE FRACTURE Left 05/29/2020   Procedure: OPEN REDUCTION INTERNAL FIXATION (ORIF) ANKLE FRACTURE;  Surgeon: Roby Lofts, MD;  Location: [facility] OR;  Service: Orthopedics;  Laterality: Left;   TUBAL LIGATION       OB History   No obstetric history on file.     Family History  Problem Relation Age of Onset   Alcohol abuse Other    Drug abuse Other    Arthritis Other    Diabetes Other    Depression Other    Hypertension Other    Kidney disease Other    Cancer Other        Breast,Colon,Lung, and Prostate Cancer   Stomach cancer Father 94   Heart attack Brother 61   Diabetes type II Other        brother, both parents    Social History   Tobacco Use   Smoking status: Every Day  Packs/day: 1.00    Years: 25.00    Pack years: 25.00    Types: Cigarettes   Smokeless tobacco: Never  Vaping Use   Vaping Use: Never used  Substance Use Topics   Alcohol use: No   Drug use: No    Home Medications Prior to Admission medications   Medication Sig Start Date End Date Taking? Authorizing Provider  acetaminophen (TYLENOL) 500 MG tablet Take 1,000 mg by mouth every 6 (six) hours as needed for moderate pain.   Yes [provider]  amLODipine (NORVASC) 10 MG tablet Take 10 mg by mouth daily. 05/22/20  Yes [provider]  atorvastatin (LIPITOR) 10 MG tablet Take 10 mg by mouth daily. 05/05/20  Yes [provider]  buPROPion (WELLBUTRIN SR) 200 MG 12 hr tablet Take 200 mg by mouth 2 (two) times daily. 04/16/18  Yes [provider]  DULoxetine (CYMBALTA) 30 MG capsule Take 30 mg by mouth daily. 04/16/18  Yes [provider]  DULoxetine (CYMBALTA) 60 MG capsule Take 60 mg by mouth daily.    Yes [provider]  HUMALOG KWIKPEN 100 UNIT/ML KiwkPen Inject 12-20 Units as directed in the morning and at bedtime. Per sliding scale 02/10/18  Yes [provider]  hydrALAZINE (APRESOLINE) 25 MG tablet Take 25 mg by mouth 3 (three) times daily.   Yes [provider]  insulin glargine (LANTUS) 100 UNIT/ML injection Inject 0.6 mLs (60 Units total) into the skin 2 (two) times daily. Patient taking differently: Inject 100 Units into the skin 2 (two) times daily. 06/04/20  Yes Osvaldo Shipper, MD  JARDIANCE 25 MG TABS tablet Take 25 mg by mouth daily. 05/22/20  Yes [provider]  lurasidone (LATUDA) 40 MG TABS tablet Take 40 mg by mouth daily. 04/16/18  Yes [provider]  LYRICA 225 MG capsule Take 225 mg by mouth 3 (three) times daily. 04/09/18  Yes [provider]  meloxicam (MOBIC) 7.5 MG tablet Take 7.5 mg by mouth at bedtime. 03/29/18  Yes [provider]  metoprolol tartrate (LOPRESSOR) 50 MG tablet Take 50 mg by mouth 2 (two) times daily. 02/10/18  Yes [provider]  Multiple Vitamin (MULTI-DAY VITAMINS PO) Take 1 tablet by mouth daily.   Yes [provider]  omeprazole (PRILOSEC) 40 MG capsule Take 40 mg by mouth daily. 03/19/18  Yes [provider]  Oxycodone HCl 10 MG TABS Take 1 tablet (10 mg total) by mouth every 6 (six) hours as needed. Patient taking differently: Take 20 mg by mouth every 6 (six) hours as needed. 07/22/21  Yes Jacalyn Lefevre, MD  polyethylene glycol (MIRALAX / GLYCOLAX) 17 g packet Take 17 g by mouth daily as needed for mild constipation. 06/19/21  Yes Despina Hidden, PA-C  QUEtiapine (SEROQUEL) 100 MG tablet Take 100 mg by mouth at bedtime. 04/16/18  Yes [provider]  Semaglutide, 1 MG/DOSE,  (OZEMPIC, 1 MG/DOSE,) 2 MG/1.5ML SOPN Inject 1 mg into the skin every 7 (seven) days. 03/05/20  Yes [provider]  SYMBICORT 80-4.5 MCG/ACT inhaler Inhale 2 puffs into the lungs 2 (two) times daily as needed (shortness of breath or wheezing). 03/19/18  Yes [provider]  traZODone (DESYREL) 100 MG tablet Take 200 mg by mouth at bedtime. 04/22/20  Yes [provider]  bisacodyl (DULCOLAX) 10 MG suppository Place 1 suppository (10 mg total) rectally daily as needed for moderate constipation. Patient not taking: No sig reported 06/19/21   Ulyses Southward  A, PA-C  enoxaparin (LOVENOX) 80 MG/0.8ML injection Inject 0.8 mLs (80 mg total) into the skin daily for 21 days. 06/19/21 07/10/21  Despina Hidden, PA-C  oxyCODONE (OXY IR/ROXICODONE) 5 MG immediate release tablet Take 1-2 tablets (5-10 mg total) by mouth every 6 (six) hours as needed for breakthrough pain (breakthrough pain between Percocet doses). Patient not taking: No sig reported 06/19/21   Despina Hidden, PA-C  oxyCODONE-acetaminophen (PERCOCET) 10-325 MG tablet Take 1 tablet by mouth every 4 (four) hours as needed for pain. Patient not taking: No sig reported 06/19/21   Despina Hidden, PA-C    Allergies    Ceftriaxone  Review of Systems   Review of Systems Ten systems reviewed and are negative for acute change, except as noted in the HPI.   Physical Exam Updated Vital Signs BP (!) 148/64   Pulse 84   Temp 98 F (36.7 C) (Oral)   Resp (!) 34   Ht 5\' 6"  (1.676 m)   Wt (!) 158.8 kg   LMP  (LMP Unknown)   SpO2 98%   BMI 56.49 kg/m   Physical Exam Vitals and nursing note reviewed.  Constitutional:      General: She is not in acute distress.    Appearance: She is well-developed. She is not diaphoretic.  HENT:     Head: Normocephalic and atraumatic.     Right Ear: External ear normal.     Left Ear: External ear normal.     Nose: Nose normal.     Mouth/Throat:     Mouth: Mucous membranes are moist.   Eyes:     General: No scleral icterus.    Conjunctiva/sclera: Conjunctivae normal.  Cardiovascular:     Rate and Rhythm: Normal rate and regular rhythm.     Heart sounds: Normal heart sounds. No murmur heard.   No friction rub. No gallop.  Pulmonary:     Effort: Pulmonary effort is normal. No respiratory distress.     Breath sounds: Normal breath sounds.  Abdominal:     General: Bowel sounds are normal. There is no distension.     Palpations: Abdomen is soft. There is no mass.     Tenderness: There is no abdominal tenderness. There is no guarding.  Musculoskeletal:     Cervical back: Normal range of motion.     Comments: Left leg examined without splint. Multiple small ulcers on the feet. Left leg swelling up to the thigh- warm to touch and red streaking medially. Compartments soft. 2+ DP pulse Wiggels toes Well healing BL surgical incision of ankle  Skin:    General: Skin is warm and dry.  Neurological:     Mental Status: She is alert and oriented to person, place, and time.  Psychiatric:        Behavior: Behavior normal.       ED Results / Procedures / Treatments   Labs (all labs ordered are listed, but only abnormal results are displayed) Labs Reviewed  BASIC METABOLIC PANEL - Abnormal; Notable for the following components:      Result Value   Sodium 133 (*)    Creatinine, Ser 1.05 (*)    Calcium 8.8 (*)    All other components within normal limits  CBC WITH DIFFERENTIAL/PLATELET - Abnormal; Notable for the following components:   WBC 13.2 (*)    Neutro Abs 9.6 (*)    Abs Immature Granulocytes 0.08 (*)    All other components within normal limits  CBC -  Abnormal; Notable for the following components:   WBC 11.1 (*)    Hemoglobin 11.7 (*)    All other components within normal limits  CBG MONITORING, ED - Abnormal; Notable for the following components:   Glucose-Capillary 67 (*)    All other components within normal limits  CBG MONITORING, ED - Abnormal;  Notable for the following components:   Glucose-Capillary 130 (*)    All other components within normal limits  RESP PANEL BY RT-PCR (FLU A&B, COVID) ARPGX2  HIV ANTIBODY (ROUTINE TESTING W REFLEX)  BRAIN NATRIURETIC PEPTIDE  CBG MONITORING, ED  CBG MONITORING, ED  CBG MONITORING, ED  TROPONIN I (HIGH SENSITIVITY)  TROPONIN I (HIGH SENSITIVITY)    EKG EKG Interpretation  Date/Time:  Thursday July 31 2021 22:00:11 EDT Ventricular Rate:  99 PR Interval:  192 QRS Duration: 88 QT Interval:  346 QTC Calculation: 444 R Axis:   19 Text Interpretation: Normal sinus rhythm Normal ECG Confirmed by Margarita Grizzle [phone]) on 08/01/2021 10:35:45 AM  Radiology DG Tibia/Fibula Left  Result Date: 07/31/2021 CLINICAL DATA:  Fracture EXAM: LEFT TIBIA AND FIBULA - 2 VIEW COMPARISON:  07/31/2021 earlier today FINDINGS: Distal tibial intramedullary nail again noted extending through the talus, calcaneus and into the tibia. Oblique fracture noted in the distal tibial shaft just above the intramedullary nail. This is unchanged since prior study. Prior resection of distal fibula. IMPRESSION: No significant change in the alignment across the oblique fracture of the distal tibial shaft just above the intramedullary nail. Electronically Signed   By: Charlett Nose M.D.   On: 07/31/2021 19:00   DG Ankle Complete Left  Result Date: 07/31/2021 CLINICAL DATA:  Ankle injury. EXAM: LEFT ANKLE COMPLETE - 3+ VIEW COMPARISON:  Left ankle x-ray 06/11/2021. FINDINGS: Overlying cast obscures fine bony detail. There is a distal tibial intramedullary nail extending into the talus and calcaneus with multiple screws. There is new acute oblique fracture at the proximal tip of the intramedullary nail. Fracture fragments are distracted 3 mm. The hardware appears intact. Distal fibular resection appears unchanged. There is no gross dislocation. IMPRESSION: 1. Acute fracture of the distal tibia at the level of the proximal  intramedullary nail. 2. Hardware otherwise appears unchanged. Electronically Signed   By: Darliss Cheney M.D.   On: 07/31/2021 17:24   CT TIBIA FIBULA LEFT WO CONTRAST  Result Date: 07/31/2021 CLINICAL DATA:  Evaluate left tibial fracture EXAM: CT OF THE LOWER LEFT EXTREMITY WITHOUT CONTRAST TECHNIQUE: Multidetector CT imaging of the lower left extremity was performed according to the standard protocol. COMPARISON:  X-ray 07/31/2021, 07/22/2021.  CT 01/09/2021 FINDINGS: Bones/Joint/Cartilage Postsurgical changes from tibiotalocalcaneal fusion with retrograde nail and screw hardware. Acute perihardware fracture centered at the tip of the nail within the distal tibial diaphysis. There is minimal lateral displacement at the fracture site. A nondisplaced oblique fracture component extends distally to the level of the distal metadiaphysis (series 13, image 55). Extensive postsurgical changes with evidence of prior hardware removal in the distal tibia and fibula with chronic fracture deformities. There is mild lucency surrounding the nail and screws, particularly within the calcaneus compatible with loosening. Marked bony fragmentation at the tibiotalar joint with collapse of the talar dome. The lateral malleolus has been resected. There is numerous densities within the resection bed likely bone cement or spacers. Advanced midfoot arthropathy is similar in appearance to prior. Ligaments Suboptimally assessed by CT. Muscles and Tendons Fatty atrophy of the lower leg musculature. Tendinous structures are not well evaluated, but  appear grossly intact. Soft tissues Circumferential soft tissue swelling about the lower leg and foot. Prominent soft tissue thickening surrounds the tibiotalar joint. No organized fluid collection. No soft tissue gas. IMPRESSION: 1. Postsurgical changes from tibiotalocalcaneal fusion with retrograde nail and screw hardware. Acute perihardware fracture centered at the tip of the nail within the  distal tibial diaphysis with minimal lateral displacement at the fracture site. A nondisplaced oblique fracture component extends distally to the level of the distal metadiaphysis. 2. Extensive postsurgical changes with evidence of prior hardware removal in the distal tibia and fibula with chronic fracture deformities. Marked bony fragmentation at the tibiotalar joint with collapse of the talar dome. Prominent soft tissue thickening surrounding the tibiotalar joint, which could reflect fibrotic changes secondary to surgery or possibly related to underlying infection. 3. Circumferential soft tissue swelling about the lower leg and foot without organized fluid collection or soft tissue gas. Electronically Signed   By: Duanne Guess D.O.   On: 07/31/2021 20:04   DG CHEST PORT 1 VIEW  Result Date: 07/31/2021 CLINICAL DATA:  Cough EXAM: PORTABLE CHEST 1 VIEW COMPARISON:  05/28/2020 FINDINGS: Heart is borderline in size. No confluent opacities, effusions or edema. No acute bony abnormality. IMPRESSION: No acute cardiopulmonary disease. Electronically Signed   By: Charlett Nose M.D.   On: 07/31/2021 23:05   DG C-Arm 1-60 Min-No Report  Result Date: 08/01/2021 Fluoroscopy was utilized by the requesting physician.  No radiographic interpretation.   DG C-Arm 1-60 Min-No Report  Result Date: 08/01/2021 Fluoroscopy was utilized by the requesting physician.  No radiographic interpretation.   VAS [place] LOWER EXTREMITY VENOUS (DVT) (ONLY [facility] & [facility])  Result Date: 07/31/2021  Lower Venous DVT Study Patient Name:  [name]  Date of Exam:   07/31/2021 Medical Rec #: [id number]      Accession #:    [id number] Date of Birth: 1960/12/23     Patient Gender: F Patient Age:   36 years Exam Location:  [facility] Procedure:      VAS [place] LOWER EXTREMITY VENOUS (DVT) Referring Phys: Kamarii Carton --------------------------------------------------------------------------------  Indications: New trauma to left ankle  s/p ORIF.  Limitations: Body habitus. Comparison       07-22-2021 Bilateral lower extremity venous was negative for Study:           DVT. Performing Technologist: Jean Rosenthal RDMS, RVT  Examination Guidelines: A complete evaluation includes B-mode imaging, spectral Doppler, color Doppler, and power Doppler as needed of all accessible portions of each vessel. Bilateral testing is considered an integral part of a complete examination. Limited examinations for reoccurring indications may be performed as noted. The reflux portion of the exam is performed with the patient in reverse Trendelenburg.  +---------+---------------+---------+-----------+----------+--------------+ LEFT     CompressibilityPhasicitySpontaneityPropertiesThrombus Aging +---------+---------------+---------+-----------+----------+--------------+ CFV      Full           Yes      Yes                                 +---------+---------------+---------+-----------+----------+--------------+ SFJ      Full                                                        +---------+---------------+---------+-----------+----------+--------------+ FV Prox  Full                                                        +---------+---------------+---------+-----------+----------+--------------+ FV Mid   Full           Yes      Yes                                 +---------+---------------+---------+-----------+----------+--------------+ FV Distal               Yes      Yes                                 +---------+---------------+---------+-----------+----------+--------------+ PFV      Full                                                        +---------+---------------+---------+-----------+----------+--------------+ POP      Full           Yes      Yes                                 +---------+---------------+---------+-----------+----------+--------------+ PTV                     Yes      Yes                                  +---------+---------------+---------+-----------+----------+--------------+ PERO                    Yes      Yes                                 +---------+---------------+---------+-----------+----------+--------------+     Summary: LEFT: - There is no evidence of deep vein thrombosis in the lower extremity. However, portions of this examination were limited- see technologist comments above.  - No cystic structure found in the popliteal fossa.  *See table(s) above for measurements and observations.    Preliminary     Procedures Procedures   Medications Ordered in ED Medications  morphine 2 MG/ML injection 1 mg ( Intravenous MAR Hold 08/01/21 0953)  amLODipine (NORVASC) tablet 10 mg ( Oral Automatically Held 08/16/21 1000)  atorvastatin (LIPITOR) tablet 10 mg ( Oral Automatically Held 08/16/21 1000)  hydrALAZINE (APRESOLINE) tablet 25 mg ( Oral Automatically Held 08/16/21 2200)  metoprolol tartrate (LOPRESSOR) tablet 50 mg ( Oral Automatically Held 08/16/21 2200)  buPROPion ER (WELLBUTRIN SR) 12 hr tablet 200 mg ( Oral Automatically Held 08/16/21 2200)  DULoxetine (CYMBALTA) DR capsule 90 mg ( Oral Automatically Held 08/16/21 1000)  lurasidone (LATUDA) tablet 40 mg ( Oral Automatically Held 08/16/21 1000)  QUEtiapine (SEROQUEL) tablet 100 mg ( Oral Automatically Held 08/16/21 2200)  traZODone (DESYREL) tablet 200 mg ( Oral Automatically Held  08/16/21 2200)  pantoprazole (PROTONIX) EC tablet 40 mg ( Oral Automatically Held 08/16/21 1000)  pregabalin (LYRICA) capsule 225 mg ( Oral Automatically Held 08/16/21 2200)  mometasone-formoterol (DULERA) 100-5 MCG/ACT inhaler 2 puff ( Inhalation Automatically Held 08/09/21 2000)  clindamycin (CLEOCIN) IVPB 600 mg ( Intravenous Automatically Held 08/07/21 2200)  insulin aspart (novoLOG) injection 0-9 Units ( Subcutaneous Automatically Held 08/16/21 2000)  dextrose 50 % solution 12.5 g ( Intravenous MAR Hold 08/01/21 0953)   albuterol (PROVENTIL) (2.5 MG/3ML) 0.083% nebulizer solution 2.5 mg ( Nebulization MAR Hold 08/01/21 0953)  polyethylene glycol (MIRALAX / GLYCOLAX) packet 17 g ( Oral Automatically Held 08/09/21 1000)  senna-docusate (Senokot-S) tablet 1 tablet ( Oral MAR Hold 08/01/21 0953)  0.9 %  sodium chloride infusion ( Intravenous MAR Hold 08/01/21 0953)  empagliflozin (JARDIANCE) tablet 25 mg ( Oral Automatically Held 08/09/21 1000)  lactated ringers infusion (has no administration in time range)  lactated ringers infusion ( Intravenous Restarted 08/01/21 1044)  oxyCODONE-acetaminophen (PERCOCET/ROXICET) 5-325 MG per tablet 2 tablet (2 tablets Oral Given 07/31/21 1733)  clindamycin (CLEOCIN) IVPB 600 mg (0 mg Intravenous Stopped 07/31/21 2111)  acetaminophen (TYLENOL) tablet 1,000 mg (1,000 mg Oral Given 08/01/21 1022)  chlorhexidine (PERIDEX) 0.12 % solution 15 mL (15 mLs Mouth/Throat Given 08/01/21 1030)    Or  MEDLINE mouth rinse ( Mouth Rinse See Alternative 08/01/21 1030)  fentaNYL (SUBLIMAZE) 100 MCG/2ML injection (  Duplicate 08/01/21 1033)  fentaNYL (SUBLIMAZE) injection 50 mcg (50 mcg Intravenous Given 08/01/21 1008)  midazolam (VERSED) injection 1 mg (1 mg Intravenous Given 08/01/21 1008)    ED Course  I have reviewed the triage vital signs and the nursing notes.  Pertinent labs & imaging results that were available during my care of the patient were reviewed by me and considered in my medical decision making (see chart for details).    MDM Rules/Calculators/A&P                           Patient here with injury to left leg. Imaging shows acute fracture above the tibial nail. Patient labs reviewed show leukocytosis- BMP w/o acute findings- negative resp panel. Cxr without abnormality.  [place] negative for DVt- suspect cellulitis.given IV clindamycin.patient case discussed with Dr.Marchwiany who asks for admission- pt eval tomorrwo with Dr. Jena Gauss Patient will be admitted to the  hospitals service. Discussed with Dr. Loney Loh. Final Clinical Impression(s) / ED Diagnoses Final diagnoses:  Closed fracture of shaft of right tibia, unspecified fracture morphology, initial encounter  Cellulitis of left lower extremity    Rx / DC Orders ED Discharge Orders     None        Arthor Captain, PA-C 08/01/21 1227    Pollyann Savoy, MD 08/04/21 0820    Arthor Captain, PA-C 08/04/21 1610    Pollyann Savoy, MD 08/04/21 1552    Arthor Captain, PA-C 08/07/21 1429    Pollyann Savoy, MD 08/07/21 [phone]

## 2021-07-31 NOTE — ED Triage Notes (Signed)
Pt here via [facility] from home for ankle injury. Pt had surgery in sept to have hardware removed from L ankle, had ankle fusion. Pt had re-broken ankle a week and a half ago, was seen in the ED for it. Ptt was supposed to be nonweightbearing but lives alone. Pt stood up last night and heard ankle "crack", c/o 10/10 pain since.

## 2021-07-31 NOTE — Progress Notes (Signed)
Patient with recent ankle fusion with Dr. Jena Gauss. Presents today with new perimplant tibia fracture. Plan to ORIF with Dr. Jena Gauss tomorrow morning. Formal ortho consult to be completed in the morning prior to surgery.

## 2021-07-31 NOTE — Progress Notes (Signed)
Lower extremity venous LT study completed.  Preliminary results relayed to [place], [place].  See CV Proc for preliminary results report.   Jean Rosenthal, RDMS, RVT

## 2021-08-01 ENCOUNTER — Inpatient Hospital Stay (HOSPITAL_COMMUNITY): Payer: Medicare Other | Admitting: Anesthesiology

## 2021-08-01 ENCOUNTER — Encounter (HOSPITAL_COMMUNITY): Payer: Self-pay | Admitting: Internal Medicine

## 2021-08-01 ENCOUNTER — Encounter (HOSPITAL_COMMUNITY)
Admission: EM | Disposition: A | Discharge status: Skilled Nursing Facility | Payer: Self-pay | Source: Home / Self Care | Attending: Family Medicine

## 2021-08-01 ENCOUNTER — Other Ambulatory Visit (HOSPITAL_COMMUNITY): Payer: Medicaid Other

## 2021-08-01 ENCOUNTER — Inpatient Hospital Stay (HOSPITAL_COMMUNITY): Payer: Medicare Other

## 2021-08-01 HISTORY — PX: ORIF TIBIA FRACTURE: SHX5416

## 2021-08-01 LAB — CBC
HCT: 37.8 % (ref 36.0–46.0)
Hemoglobin: 11.7 g/dL — ABNORMAL LOW (ref 12.0–15.0)
MCH: 28.3 pg (ref 26.0–34.0)
MCHC: 31 g/dL (ref 30.0–36.0)
MCV: 91.3 fL (ref 80.0–100.0)
Platelets: 257 10*3/uL (ref 150–400)
RBC: 4.14 MIL/uL (ref 3.87–5.11)
RDW: 14.6 % (ref 11.5–15.5)
WBC: 11.1 10*3/uL — ABNORMAL HIGH (ref 4.0–10.5)
nRBC: 0 % (ref 0.0–0.2)

## 2021-08-01 LAB — CBG MONITORING, ED
Glucose-Capillary: 130 mg/dL — ABNORMAL HIGH (ref 70–99)
Glucose-Capillary: 67 mg/dL — ABNORMAL LOW (ref 70–99)
Glucose-Capillary: 74 mg/dL (ref 70–99)
Glucose-Capillary: 89 mg/dL (ref 70–99)

## 2021-08-01 LAB — GLUCOSE, CAPILLARY
Glucose-Capillary: 103 mg/dL — ABNORMAL HIGH (ref 70–99)
Glucose-Capillary: 148 mg/dL — ABNORMAL HIGH (ref 70–99)
Glucose-Capillary: 223 mg/dL — ABNORMAL HIGH (ref 70–99)
Glucose-Capillary: 244 mg/dL — ABNORMAL HIGH (ref 70–99)

## 2021-08-01 LAB — TROPONIN I (HIGH SENSITIVITY)
Troponin I (High Sensitivity): 4 ng/L (ref <18)
Troponin I (High Sensitivity): 5 ng/L (ref <18)

## 2021-08-01 LAB — BRAIN NATRIURETIC PEPTIDE: B Natriuretic Peptide: 28.2 pg/mL (ref 0.0–100.0)

## 2021-08-01 LAB — HIV ANTIBODY (ROUTINE TESTING W REFLEX): HIV Screen 4th Generation wRfx: NONREACTIVE

## 2021-08-01 SURGERY — OPEN REDUCTION INTERNAL FIXATION (ORIF) TIBIA FRACTURE
Anesthesia: Regional | Laterality: Left

## 2021-08-01 MED ORDER — METHOCARBAMOL 1000 MG/10ML IJ SOLN
750.0000 mg | Freq: Four times a day (QID) | INTRAVENOUS | Status: DC | PRN
  Filled 2021-08-01 (×2): qty 7.5

## 2021-08-01 MED ORDER — DEXAMETHASONE SODIUM PHOSPHATE 10 MG/ML IJ SOLN
INTRAMUSCULAR | Status: DC | PRN
  Administered 2021-08-01: 10 mg

## 2021-08-01 MED ORDER — MIDAZOLAM HCL 2 MG/2ML IJ SOLN
INTRAMUSCULAR | Status: AC
  Filled 2021-08-01: qty 2

## 2021-08-01 MED ORDER — METHOCARBAMOL 750 MG PO TABS
750.0000 mg | ORAL_TABLET | Freq: Four times a day (QID) | ORAL | Status: DC | PRN
  Administered 2021-08-02 – 2021-08-08 (×8): 750 mg via ORAL
  Filled 2021-08-01 (×2): qty 2
  Filled 2021-08-01: qty 1
  Filled 2021-08-01 (×4): qty 2
  Filled 2021-08-01: qty 1

## 2021-08-01 MED ORDER — PHENYLEPHRINE 40 MCG/ML (10ML) SYRINGE FOR IV PUSH (FOR BLOOD PRESSURE SUPPORT)
PREFILLED_SYRINGE | INTRAVENOUS | Status: DC | PRN
  Administered 2021-08-01 (×2): 120 ug via INTRAVENOUS

## 2021-08-01 MED ORDER — ONDANSETRON HCL 4 MG PO TABS: 4.0000 mg | ORAL_TABLET | Freq: Four times a day (QID) | ORAL | Status: DC | PRN

## 2021-08-01 MED ORDER — ACETAMINOPHEN 500 MG PO TABS
1000.0000 mg | ORAL_TABLET | Freq: Four times a day (QID) | ORAL | Status: DC
  Administered 2021-08-01 – 2021-08-08 (×26): 1000 mg via ORAL
  Filled 2021-08-01 (×28): qty 2

## 2021-08-01 MED ORDER — LIDOCAINE 2% (20 MG/ML) 5 ML SYRINGE
INTRAMUSCULAR | Status: DC | PRN
Start: 2021-08-01 — End: 2021-08-01
  Administered 2021-08-01: 40 mg via INTRAVENOUS

## 2021-08-01 MED ORDER — DEXAMETHASONE SODIUM PHOSPHATE 10 MG/ML IJ SOLN
INTRAMUSCULAR | Status: AC
  Filled 2021-08-01: qty 1

## 2021-08-01 MED ORDER — MIDAZOLAM HCL 2 MG/2ML IJ SOLN: 1.0000 mg | Freq: Once | INTRAMUSCULAR | Status: AC

## 2021-08-01 MED ORDER — VANCOMYCIN HCL 1000 MG IV SOLR
INTRAVENOUS | Status: DC | PRN
  Administered 2021-08-01: 1000 mg via TOPICAL

## 2021-08-01 MED ORDER — HYDROMORPHONE HCL 1 MG/ML IJ SOLN
0.5000 mg | INTRAMUSCULAR | Status: DC | PRN
  Administered 2021-08-02 – 2021-08-03 (×2): 1 mg via INTRAVENOUS
  Filled 2021-08-01 (×2): qty 1

## 2021-08-01 MED ORDER — FENTANYL CITRATE (PF) 250 MCG/5ML IJ SOLN
INTRAMUSCULAR | Status: AC
  Filled 2021-08-01: qty 5

## 2021-08-01 MED ORDER — 0.9 % SODIUM CHLORIDE (POUR BTL) OPTIME
TOPICAL | Status: DC | PRN
  Administered 2021-08-01: 1000 mL

## 2021-08-01 MED ORDER — VANCOMYCIN HCL 1000 MG IV SOLR
INTRAVENOUS | Status: AC
  Filled 2021-08-01: qty 20

## 2021-08-01 MED ORDER — ONDANSETRON HCL 4 MG/2ML IJ SOLN
INTRAMUSCULAR | Status: DC | PRN
  Administered 2021-08-01: 4 mg via INTRAVENOUS

## 2021-08-01 MED ORDER — POLYETHYLENE GLYCOL 3350 17 G PO PACK: 17.0000 g | PACK | Freq: Every day | ORAL | Status: DC | PRN

## 2021-08-01 MED ORDER — AMISULPRIDE (ANTIEMETIC) 5 MG/2ML IV SOLN: 10.0000 mg | Freq: Once | INTRAVENOUS | Status: DC | PRN

## 2021-08-01 MED ORDER — MIDAZOLAM HCL 2 MG/2ML IJ SOLN
INTRAMUSCULAR | Status: AC
  Administered 2021-08-01: 1 mg via INTRAVENOUS
  Filled 2021-08-01: qty 2

## 2021-08-01 MED ORDER — SODIUM CHLORIDE 0.9 % IV SOLN: INTRAVENOUS | Status: DC | PRN

## 2021-08-01 MED ORDER — EPHEDRINE SULFATE-NACL 50-0.9 MG/10ML-% IV SOSY
PREFILLED_SYRINGE | INTRAVENOUS | Status: DC | PRN
  Administered 2021-08-01 (×2): 10 mg via INTRAVENOUS

## 2021-08-01 MED ORDER — PROMETHAZINE HCL 25 MG/ML IJ SOLN: 6.2500 mg | INTRAMUSCULAR | Status: DC | PRN

## 2021-08-01 MED ORDER — EMPAGLIFLOZIN 25 MG PO TABS
25.0000 mg | ORAL_TABLET | Freq: Every day | ORAL | Status: DC
  Administered 2021-08-02 – 2021-08-08 (×7): 25 mg via ORAL
  Filled 2021-08-01 (×8): qty 1

## 2021-08-01 MED ORDER — KETOROLAC TROMETHAMINE 15 MG/ML IJ SOLN
15.0000 mg | Freq: Four times a day (QID) | INTRAMUSCULAR | Status: AC
  Administered 2021-08-01 – 2021-08-02 (×4): 15 mg via INTRAVENOUS
  Filled 2021-08-01 (×4): qty 1

## 2021-08-01 MED ORDER — OXYCODONE-ACETAMINOPHEN 10-325 MG PO TABS: 1.0000 | ORAL_TABLET | ORAL | Status: DC | PRN

## 2021-08-01 MED ORDER — OXYCODONE HCL 5 MG/5ML PO SOLN: 5.0000 mg | Freq: Once | ORAL | Status: DC | PRN

## 2021-08-01 MED ORDER — LACTATED RINGERS IV SOLN: INTRAVENOUS | Status: DC

## 2021-08-01 MED ORDER — EPHEDRINE 5 MG/ML INJ
INTRAVENOUS | Status: AC
  Filled 2021-08-01: qty 5

## 2021-08-01 MED ORDER — OXYCODONE HCL 5 MG PO TABS: 5.0000 mg | ORAL_TABLET | Freq: Once | ORAL | Status: DC | PRN

## 2021-08-01 MED ORDER — PROPOFOL 10 MG/ML IV BOLUS
INTRAVENOUS | Status: AC
  Filled 2021-08-01: qty 40

## 2021-08-01 MED ORDER — FENTANYL CITRATE PF 50 MCG/ML IJ SOSY
50.0000 ug | PREFILLED_SYRINGE | Freq: Once | INTRAMUSCULAR | Status: AC
  Administered 2021-08-01: 50 ug via INTRAVENOUS

## 2021-08-01 MED ORDER — METOCLOPRAMIDE HCL 5 MG/ML IJ SOLN: 5.0000 mg | Freq: Three times a day (TID) | INTRAMUSCULAR | Status: DC | PRN

## 2021-08-01 MED ORDER — CHLORHEXIDINE GLUCONATE 0.12 % MT SOLN
15.0000 mL | Freq: Once | OROMUCOSAL | Status: AC
  Administered 2021-08-01: 15 mL via OROMUCOSAL

## 2021-08-01 MED ORDER — BUPIVACAINE HCL (PF) 0.5 % IJ SOLN
INTRAMUSCULAR | Status: DC | PRN
  Administered 2021-08-01: 40 mL via PERINEURAL

## 2021-08-01 MED ORDER — ACETAMINOPHEN 500 MG PO TABS: 1000.0000 mg | ORAL_TABLET | Freq: Once | ORAL | Status: AC

## 2021-08-01 MED ORDER — ORAL CARE MOUTH RINSE: 15.0000 mL | Freq: Once | OROMUCOSAL | Status: AC

## 2021-08-01 MED ORDER — HYDROMORPHONE HCL 1 MG/ML IJ SOLN: 0.2500 mg | INTRAMUSCULAR | Status: DC | PRN

## 2021-08-01 MED ORDER — PHENYLEPHRINE 40 MCG/ML (10ML) SYRINGE FOR IV PUSH (FOR BLOOD PRESSURE SUPPORT)
PREFILLED_SYRINGE | INTRAVENOUS | Status: AC
  Filled 2021-08-01: qty 10

## 2021-08-01 MED ORDER — DOCUSATE SODIUM 100 MG PO CAPS
100.0000 mg | ORAL_CAPSULE | Freq: Two times a day (BID) | ORAL | Status: DC
  Administered 2021-08-01 – 2021-08-08 (×13): 100 mg via ORAL
  Filled 2021-08-01 (×15): qty 1

## 2021-08-01 MED ORDER — METOCLOPRAMIDE HCL 10 MG PO TABS
5.0000 mg | ORAL_TABLET | Freq: Three times a day (TID) | ORAL | Status: DC | PRN
  Administered 2021-08-06: 10 mg via ORAL
  Filled 2021-08-01 (×2): qty 1

## 2021-08-01 MED ORDER — ONDANSETRON HCL 4 MG/2ML IJ SOLN
INTRAMUSCULAR | Status: AC
  Filled 2021-08-01: qty 2

## 2021-08-01 MED ORDER — MEPERIDINE HCL 25 MG/ML IJ SOLN: 6.2500 mg | INTRAMUSCULAR | Status: DC | PRN

## 2021-08-01 MED ORDER — PROPOFOL 10 MG/ML IV BOLUS
INTRAVENOUS | Status: DC | PRN
  Administered 2021-08-01: 200 mg via INTRAVENOUS

## 2021-08-01 MED ORDER — OXYCODONE HCL 5 MG PO TABS
10.0000 mg | ORAL_TABLET | ORAL | Status: DC | PRN
Start: 2021-08-01 — End: 2021-08-03
  Administered 2021-08-01 – 2021-08-03 (×8): 15 mg via ORAL
  Filled 2021-08-01 (×8): qty 3

## 2021-08-01 MED ORDER — FENTANYL CITRATE (PF) 100 MCG/2ML IJ SOLN
INTRAMUSCULAR | Status: AC
  Filled 2021-08-01: qty 2

## 2021-08-01 MED ORDER — ONDANSETRON HCL 4 MG/2ML IJ SOLN: 4.0000 mg | Freq: Four times a day (QID) | INTRAMUSCULAR | Status: DC | PRN

## 2021-08-01 MED ORDER — ACETAMINOPHEN 500 MG PO TABS
ORAL_TABLET | ORAL | Status: AC
  Administered 2021-08-01: 1000 mg via ORAL
  Filled 2021-08-01: qty 2

## 2021-08-01 MED ORDER — ENOXAPARIN SODIUM 40 MG/0.4ML IJ SOSY
40.0000 mg | PREFILLED_SYRINGE | INTRAMUSCULAR | Status: DC
  Administered 2021-08-02 – 2021-08-08 (×7): 40 mg via SUBCUTANEOUS
  Filled 2021-08-01 (×7): qty 0.4

## 2021-08-01 SURGICAL SUPPLY — 77 items
BAG COUNTER SPONGE SURGICOUNT (BAG) IMPLANT
BANDAGE ESMARK 6X9 LF (GAUZE/BANDAGES/DRESSINGS) IMPLANT
BIT DRILL 2.5X2.75 QC CALB (BIT) ×1 IMPLANT
BIT DRILL CALIBRATED 2.7 (BIT) ×1 IMPLANT
BLADE CLIPPER SURG (BLADE) IMPLANT
BNDG ELASTIC 4X5.8 VLCR STR LF (GAUZE/BANDAGES/DRESSINGS) ×2 IMPLANT
BNDG ELASTIC 6X5.8 VLCR STR LF (GAUZE/BANDAGES/DRESSINGS) ×2 IMPLANT
BNDG ESMARK 6X9 LF (GAUZE/BANDAGES/DRESSINGS)
BNDG GAUZE ELAST 4 BULKY (GAUZE/BANDAGES/DRESSINGS) ×2 IMPLANT
BRUSH SCRUB EZ PLAIN DRY (MISCELLANEOUS) ×4 IMPLANT
CHLORAPREP W/TINT 26 (MISCELLANEOUS) ×2 IMPLANT
COVER SURGICAL LIGHT HANDLE (MISCELLANEOUS) ×2 IMPLANT
CUFF TOURN SGL QUICK 34 (TOURNIQUET CUFF) ×2
CUFF TRNQT CYL 34X4.125X (TOURNIQUET CUFF) ×1 IMPLANT
DRAPE C-ARM 42X72 X-RAY (DRAPES) ×2 IMPLANT
DRAPE C-ARMOR (DRAPES) ×2 IMPLANT
DRAPE ORTHO SPLIT 77X108 STRL (DRAPES) ×4
DRAPE SURG ORHT 6 SPLT 77X108 (DRAPES) ×2 IMPLANT
DRAPE U-SHAPE 47X51 STRL (DRAPES) ×2 IMPLANT
DRSG ADAPTIC 3X8 NADH LF (GAUZE/BANDAGES/DRESSINGS) ×2 IMPLANT
DRSG MEPITEL 4X7.2 (GAUZE/BANDAGES/DRESSINGS) ×1 IMPLANT
DRSG PAD ABDOMINAL 8X10 ST (GAUZE/BANDAGES/DRESSINGS) ×8 IMPLANT
ELECT REM PT RETURN 9FT ADLT (ELECTROSURGICAL) ×2
ELECTRODE REM PT RTRN 9FT ADLT (ELECTROSURGICAL) ×1 IMPLANT
GAUZE SPONGE 4X4 12PLY STRL (GAUZE/BANDAGES/DRESSINGS) ×2 IMPLANT
GLOVE SURG ENC MOIS LTX SZ6.5 (GLOVE) ×6 IMPLANT
GLOVE SURG ENC MOIS LTX SZ7.5 (GLOVE) ×8 IMPLANT
GLOVE SURG UNDER POLY LF SZ6.5 (GLOVE) ×2 IMPLANT
GLOVE SURG UNDER POLY LF SZ7.5 (GLOVE) ×2 IMPLANT
GLOVE XGUARD RR 2 7.5 (GLOVE) ×1 IMPLANT
GLOVE XGUARD RR2 7.5 (GLOVE) ×2
GOWN STRL REUS W/ TWL LRG LVL3 (GOWN DISPOSABLE) ×2 IMPLANT
GOWN STRL REUS W/TWL LRG LVL3 (GOWN DISPOSABLE) ×4
K-WIRE ACE 1.6X6 (WIRE) ×2
KIT BASIN OR (CUSTOM PROCEDURE TRAY) ×2 IMPLANT
KIT TURNOVER KIT B (KITS) ×2 IMPLANT
KWIRE ACE 1.6X6 (WIRE) IMPLANT
MANIFOLD NEPTUNE II (INSTRUMENTS) IMPLANT
NS IRRIG 1000ML POUR BTL (IV SOLUTION) ×2 IMPLANT
PACK TOTAL JOINT (CUSTOM PROCEDURE TRAY) ×2 IMPLANT
PAD ARMBOARD 7.5X6 YLW CONV (MISCELLANEOUS) ×4 IMPLANT
PAD CAST 4YDX4 CTTN HI CHSV (CAST SUPPLIES) ×1 IMPLANT
PADDING CAST COTTON 4X4 STRL (CAST SUPPLIES) ×2
PADDING CAST COTTON 6X4 STRL (CAST SUPPLIES) ×2 IMPLANT
PLATE LOCK 15H LT MED DIST TIB (Plate) ×1 IMPLANT
SCREW CORT FT 32X3.5XNONLOCK (Screw) IMPLANT
SCREW CORTICAL 3.5MM  28MM (Screw) ×6 IMPLANT
SCREW CORTICAL 3.5MM  32MM (Screw) ×2 IMPLANT
SCREW CORTICAL 3.5MM 26MM (Screw) ×1 IMPLANT
SCREW CORTICAL 3.5MM 28MM (Screw) IMPLANT
SCREW LOCK CORT STAR 3.5X34 (Screw) ×1 IMPLANT
SCREW LOCK CORT STAR 3.5X38 (Screw) ×1 IMPLANT
SCREW LOCK CORT STAR 3.5X42 (Screw) ×1 IMPLANT
SCREW LOCK CORT STAR 3.5X46 (Screw) ×1 IMPLANT
SCREW LOCK CORT STAR 3.5X48 (Screw) ×1 IMPLANT
SCREW LOCK CORT STAR 3.5X58 (Screw) ×1 IMPLANT
SCREW T15 LP CORT 3.5X56MM NS (Screw) ×1 IMPLANT
SPONGE T-LAP 18X18 ~~LOC~~+RFID (SPONGE) IMPLANT
STAPLER VISISTAT 35W (STAPLE) IMPLANT
SUCTION FRAZIER HANDLE 10FR (MISCELLANEOUS) ×2
SUCTION TUBE FRAZIER 10FR DISP (MISCELLANEOUS) ×1 IMPLANT
SUT ETHILON 3 0 PS 1 (SUTURE) ×2 IMPLANT
SUT MNCRL AB 3-0 PS2 18 (SUTURE) ×2 IMPLANT
SUT PROLENE 0 CT (SUTURE) IMPLANT
SUT VIC AB 0 CT1 27 (SUTURE) ×2
SUT VIC AB 0 CT1 27XBRD ANBCTR (SUTURE) ×1 IMPLANT
SUT VIC AB 1 CT1 18XCR BRD 8 (SUTURE) IMPLANT
SUT VIC AB 1 CT1 27 (SUTURE) ×2
SUT VIC AB 1 CT1 27XBRD ANBCTR (SUTURE) ×1 IMPLANT
SUT VIC AB 1 CT1 8-18 (SUTURE) ×2
SUT VIC AB 2-0 CT1 27 (SUTURE) ×2
SUT VIC AB 2-0 CT1 TAPERPNT 27 (SUTURE) ×2 IMPLANT
TOWEL GREEN STERILE (TOWEL DISPOSABLE) ×4 IMPLANT
TOWEL GREEN STERILE FF (TOWEL DISPOSABLE) ×2 IMPLANT
TRAY FOLEY MTR SLVR 16FR STAT (SET/KITS/TRAYS/PACK) IMPLANT
UNDERPAD 30X36 HEAVY ABSORB (UNDERPADS AND DIAPERS) ×2 IMPLANT
WATER STERILE IRR 1000ML POUR (IV SOLUTION) ×4 IMPLANT

## 2021-08-01 NOTE — Op Note (Signed)
Orthopaedic Surgery Operative Note (CSN: [id number] ) Date of Surgery: 08/01/2021  Admit Date: 07/31/2021   Diagnoses: Pre-Op Diagnoses: Left periprosthetic tibial shaft fracture  Post-Op Diagnosis: Same  Procedures: CPT 27758-Open reduction internal fixation of left tibial shaft  Surgeons : Primary: Roby Lofts, MD  Assistant: Ulyses Southward, PA-C  Location: OR 3   Anesthesia:General with regional   Antibiotics: Clindamycin 900mg  preop with 1 gm vancomycin powder  Tourniquet time: None used    Estimated Blood Loss:50 mL  Complications:None  Specimens:* No specimens in log *   Implants: Implant Name Type Inv. Item Serial No. Manufacturer Lot No. LRB No. Used Action  PLATE LOCK LT MED DIST TIB - 02R Plate PLATE LOCK [id number] MED DIST TIB  [employer](ORTH,TRAU,BIO,SG)  Left 1 Implanted  SCREW CORTICAL 3.5MM  28B - Screw SCREW CORTICAL 3.5MM  [id number]  [employer](ORTH,TRAU,BIO,SG)  Left 2 Implanted  SCREW CORTICAL 3.5MM - Screw SCREW CORTICAL 3.5MM [id number]  [employer](ORTH,TRAU,BIO,SG)  Left 1 Implanted  SCREW CORTICAL 3.5MM  - Screw SCREW CORTICAL 3.5MM  [id number]  [employer](ORTH,TRAU,BIO,SG)  Left 1 Implanted  SCREW LOCK CORT STAR 3.5X48 - Screw SCREW LOCK CORT STAR 3.5X48  [employer](ORTH,TRAU,BIO,SG)  Left 1 Implanted  SCREW LOCK CORT STAR 3.5X38 - [id number] Screw SCREW LOCK CORT STAR 3.5X38  [employer](ORTH,TRAU,BIO,SG)  Left 1 Implanted  SCREW LOCK CORT STAR 3.5X46 - [id number] Screw SCREW LOCK CORT STAR 3.5X46  [employer](ORTH,TRAU,BIO,SG)  Left 1 Implanted  SCREW LOCK CORT STAR 3.5X34 - [id number] Screw SCREW LOCK CORT STAR 3.5X34  [employer](ORTH,TRAU,BIO,SG)  Left 1 Implanted  SCREW LOCK CORT STAR 3.5X58 - [id number] Screw SCREW LOCK CORT STAR 3.5X58  [employer](ORTH,TRAU,BIO,SG)  Left 1 Implanted  SCREW LOCK CORT STAR 3.5X42 - [id number] Screw SCREW LOCK CORT STAR 3.5X42  [employer](ORTH,TRAU,BIO,SG)   Left 1 Implanted  SCREW T15 LP CORT 3.5X56MM NS - [id number] Screw SCREW T15 LP CORT 3.5X56MM NS  [employer](ORTH,TRAU,BIO,SG)  Left 1 Implanted     Indications for Surgery: A 60 year old female who has diabetic neuropathy who sustained a left ankle fracture dislocation.  She underwent open reduction internal fixation.  She subsequently went on to develop diabetic Charcot arthropathy.  She continued to have pain even after prolonged conservative management.  I will proceeded with a ankle fusion and TTC nailing.  She unfortunately was unable to maintain nonweightbearing precautions for the recommended duration.  She twisted her ankle going to the bathroom a week and a half ago at which point she sustained a nondisplaced tibial shaft fracture.  Unfortunately due to her living situation, she continued to have to weight-bear on it and she displaced the fracture.  I recommend proceeding with open reduction internal fixation.  Risks and benefits were discussed with the patient.  Risks included but not limited to bleeding, infection, malunion, nonunion, hardware failure, hardware irritation, nerve or blood vessel injury, DVT, even the possibility anesthetic complications.  She agreed to proceed with surgery and consent was obtained.  Operative Findings: Open reduction internal fixation of left tibial shaft fracture using [employer] ALPS distal tibial medial plate  Procedure: The patient was identified in the preoperative holding area. Consent was confirmed with the patient and their family and all questions were answered. The operative extremity was marked after confirmation with the patient. she was then brought back to the operating room by our anesthesia colleagues.  She was carefully transferred to a radiolucent flat top table.  She was placed under general anesthetic.  The left lower extremity was then prepped and draped in usual sterile fashion.  A timeout was performed to verify the patient, the  procedure, and the extremity.  Preoperative antibiotics were dosed.  Fluoroscopic imaging was obtained to show the unstable nature of her injury.  I marked out an incision distally for a minimally invasive placement of the plate.  I carried it through skin and subcutaneous tissue.  I then used a Cobb elevator to develop a path along the subcutaneous border of the tibia.  I then passed a [employer] medial distal tibial ALPS locking plate.  I held provisionally distally with a K wire and confirmed position I then placed a nonlocking screw distally to bring the plate flush to bone.  I then percutaneously placed 4 nonlocking screws proximally.  I then proceeded to return to the distal segment and placed locking screws in and around the previous hardware and distal metaphysis.  Final fluoroscopic imaging was taken.  The incision was copiously irrigated.  A gram of vancomycin powder was placed into the incision.  A layered closure of 2-0 Vicryl and 3-0 nylon was used to close the skin.  Sterile dressing was applied.  Patient was then awoken from anesthesia and taken to the PACU in good condition.  Post Op Plan/Instructions: Patient will be nonweightbearing to the left lower extremity.  She will receive postoperative antibiotics.  She will receive Lovenox for DVT prophylaxis.  We will place physical and Occupational Therapy.  I was present and performed the entire surgery.  Ulyses Southward, PA-C did assist me throughout the case. An assistant was necessary given the difficulty in approach, maintenance of reduction and ability to instrument the fracture.   Truitt Merle, MD [facility]

## 2021-08-01 NOTE — H&P (View-Only) (Signed)
[facility]opaedic [facility]uma [facility] (OTS) Consult   Patient ID: [name] MRN: [id number] DOB/AGE: May 21, 1961 60 y.o.  Reason for Consult:Left periprosthetic tibia fracture  HPI: [name] is an 60 y.o. female who presents with a periprosthetic left tibia fracture.  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  She has diabetic neuropathy.  Surgery had gone well and she was compliant with nonweightbearing precautions while she was at the nursing facility.  Unfortunately her insurance company was unable to pay for any more state and she was discharged to home prior to her full recommended course of nonweightbearing.  As result of this she had to start putting weight on her leg early and about a week and a half ago she twisted her ankle while walking to the bathroom felt a pop and immediate pain.  Nothing was found on the initial x-rays and she presented to our office earlier this week.  A nondisplaced periprosthetic fracture was identified.  She was placed in a splint and made nonweightbearing.  However she lives alone and is relying on weightbearing she put weight on it yesterday and had a immediate pop and severe pain.  X-ray showed a periprosthetic fracture that had displaced.  Patient was seen in the preoperative holding area.  She unfortunately is having a lot of pain.  Denies any pain anywhere else.  Otherwise she has been in decent health.  She is a smoker.  She did have some chest pain yesterday evening which she was worked up with troponins as well as a chest x-ray.  Hospitalist today Dr. Mahala Menghini has cleared her for proceeding without further intervention with echocardiogram  Past Medical History:  Diagnosis Date   Anemia    iron deficiency   Anxiety    Arthritis    Chronic kidney disease    COPD (chronic obstructive pulmonary disease) (HCC)    Depression    Diabetes mellitus    type 2   Diabetic foot ulcer  (HCC) 04/2018   right ankle   Dyspnea    GERD (gastroesophageal reflux disease)    Headache(784.0)    History of colonic polyps    Hyperlipidemia    Hypertension    Myocardial infarction (HCC)    Neuropathy    Peptic ulcer disease    Sleep apnea     Past Surgical History:  Procedure Laterality Date   ANKLE FUSION Left 06/11/2021   Procedure: ANKLE FUSION;  Surgeon: Roby Lofts, MD;  Location: [facility] OR;  Service: Orthopedics;  Laterality: Left;   ESOPHAGOGASTRODUODENOSCOPY  10/11/2011   Procedure: ESOPHAGOGASTRODUODENOSCOPY (EGD);  Surgeon: Erick Blinks, MD;  Location: [facility] ENDOSCOPY;  Service: Gastroenterology;  Laterality: N/A;   ORIF ANKLE FRACTURE Left 05/29/2020   Procedure: OPEN REDUCTION INTERNAL FIXATION (ORIF) ANKLE FRACTURE;  Surgeon: Roby Lofts, MD;  Location: [facility] OR;  Service: Orthopedics;  Laterality: Left;   TUBAL LIGATION      Family History  Problem Relation Age of Onset   Alcohol abuse Other    Drug abuse Other    Arthritis Other    Diabetes Other    Depression Other    Hypertension Other    Kidney disease Other    Cancer Other        Breast,Colon,Lung, and Prostate Cancer   Stomach cancer Father 55   Heart attack Brother 68   Diabetes type II Other        brother, both  parents    Social History:  reports that she has been smoking cigarettes. She has a 25.00 pack-year smoking history. She has never used smokeless tobacco. She reports that she does not drink alcohol and does not use drugs.  Allergies:  Allergies  Allergen Reactions   Ceftriaxone Diarrhea and Nausea And Vomiting    Medications:  No current facility-administered medications on file prior to encounter.   Current Outpatient Medications on File Prior to Encounter  Medication Sig Dispense Refill   acetaminophen (TYLENOL) 500 MG tablet Take 1,000 mg by mouth every 6 (six) hours as needed for moderate pain.     amLODipine (NORVASC) 10 MG tablet Take 10 mg by mouth daily.     atorvastatin  (LIPITOR) 10 MG tablet Take 10 mg by mouth daily.     buPROPion (WELLBUTRIN SR) 200 MG 12 hr tablet Take 200 mg by mouth 2 (two) times daily.  0   DULoxetine (CYMBALTA) 30 MG capsule Take 30 mg by mouth daily.  2   DULoxetine (CYMBALTA) 60 MG capsule Take 60 mg by mouth daily.      HUMALOG KWIKPEN 100 UNIT/ML KiwkPen Inject 12-20 Units as directed in the morning and at bedtime. Per sliding scale     hydrALAZINE (APRESOLINE) 25 MG tablet Take 25 mg by mouth 3 (three) times daily.     insulin glargine (LANTUS) 100 UNIT/ML injection Inject 0.6 mLs (60 Units total) into the skin 2 (two) times daily. (Patient taking differently: Inject 100 Units into the skin 2 (two) times daily.) 10 mL 11   JARDIANCE 25 MG TABS tablet Take 25 mg by mouth daily.     lurasidone (LATUDA) 40 MG TABS tablet Take 40 mg by mouth daily.  0   LYRICA 225 MG capsule Take 225 mg by mouth 3 (three) times daily.     meloxicam (MOBIC) 7.5 MG tablet Take 7.5 mg by mouth at bedtime.     metoprolol tartrate (LOPRESSOR) 50 MG tablet Take 50 mg by mouth 2 (two) times daily.     Multiple Vitamin (MULTI-DAY VITAMINS PO) Take 1 tablet by mouth daily.     omeprazole (PRILOSEC) 40 MG capsule Take 40 mg by mouth daily.     Oxycodone HCl 10 MG TABS Take 1 tablet (10 mg total) by mouth every 6 (six) hours as needed. (Patient taking differently: Take 20 mg by mouth every 6 (six) hours as needed.) 15 tablet 0   polyethylene glycol (MIRALAX / GLYCOLAX) 17 g packet Take 17 g by mouth daily as needed for mild constipation. 14 each 0   QUEtiapine (SEROQUEL) 100 MG tablet Take 100 mg by mouth at bedtime.  0   Semaglutide, 1 MG/DOSE, (OZEMPIC, 1 MG/DOSE,) 2 MG/1.5ML SOPN Inject 1 mg into the skin every 7 (seven) days.     SYMBICORT 80-4.5 MCG/ACT inhaler Inhale 2 puffs into the lungs 2 (two) times daily as needed (shortness of breath or wheezing).     traZODone (DESYREL) 100 MG tablet Take 200 mg by mouth at bedtime.     bisacodyl (DULCOLAX) 10 MG  suppository Place 1 suppository (10 mg total) rectally daily as needed for moderate constipation. (Patient not taking: No sig reported) 12 suppository 0   enoxaparin (LOVENOX) 80 MG/0.8ML injection Inject 0.8 mLs (80 mg total) into the skin daily for 21 days. 16.8 mL 0   oxyCODONE (OXY IR/ROXICODONE) 5 MG immediate release tablet Take 1-2 tablets (5-10 mg total) by mouth every 6 (six) hours as needed  for breakthrough pain (breakthrough pain between Percocet doses). (Patient not taking: No sig reported) 30 tablet 0   oxyCODONE-acetaminophen (PERCOCET) 10-325 MG tablet Take 1 tablet by mouth every 4 (four) hours as needed for pain. (Patient not taking: No sig reported) 42 tablet 0     ROS: Constitutional: No fever or chills Vision: No changes in vision ENT: No difficulty swallowing CV: No chest pain Pulm: No SOB or wheezing GI: No nausea or vomiting GU: No urgency or inability to hold urine Skin: No poor wound healing Neurologic: No numbness or tingling Psychiatric: No depression or anxiety Heme: No bruising Allergic: No reaction to medications or food   Exam: Blood pressure 110/69, pulse 88, temperature 98.4 F (36.9 C), temperature source Oral, resp. rate 15, height 5' 6" (1.676 m), weight (!) 158.8 kg, SpO2 94 %. General: No acute distress Orientation: Awake alert and oriented x3 Mood and Affect: Cooperative and pleasant Gait: Unable to assess due to her fracture Coordination and balance: Within normal limits.  Left lower extremity reveals well-healed incisions.  Mild area.  Moderate swelling.  No obvious deformity.  She is able to wiggle her toes.  Sensation is diminished secondary to her neuropathy.  She is warm well perfused foot.  Right lower extremity: Skin without lesions. No tenderness to palpation. Full painless ROM, full strength in each muscle groups without evidence of instability.   Medical Decision Making: Data: Imaging: X-rays of the left tibia and CT scan shows a  periprosthetic proximal tibia fracture that is propagated from the location of the interlocking screws for the TTC nail.  Fixation at the location of the ankle is in place without any signs of hardware failure or loosening.  Labs:  Results for orders placed or performed during the hospital encounter of 07/31/21 (from the past 24 hour(s))  Basic metabolic panel     Status: Abnormal   Collection Time: 07/31/21  5:54 PM  Result Value Ref Range   Sodium 133 (L) 135 - 145 mmol/L   Potassium 4.3 3.5 - 5.1 mmol/L   Chloride 98 98 - 111 mmol/L   CO2 24 22 - 32 mmol/L   Glucose, Bld 83 70 - 99 mg/dL   BUN 12 6 - 20 mg/dL   Creatinine, Ser 1.05 (H) 0.44 - 1.00 mg/dL   Calcium 8.8 (L) 8.9 - 10.3 mg/dL   GFR, Estimated >60 >60 mL/min   Anion gap 11 5 - 15  CBC with Differential     Status: Abnormal   Collection Time: 07/31/21  5:54 PM  Result Value Ref Range   WBC 13.2 (H) 4.0 - 10.5 K/uL   RBC 4.31 3.87 - 5.11 MIL/uL   Hemoglobin 12.2 12.0 - 15.0 g/dL   HCT 40.3 36.0 - 46.0 %   MCV 93.5 80.0 - 100.0 fL   MCH 28.3 26.0 - 34.0 pg   MCHC 30.3 30.0 - 36.0 g/dL   RDW 14.6 11.5 - 15.5 %   Platelets 270 150 - 400 K/uL   nRBC 0.0 0.0 - 0.2 %   Neutrophils Relative % 72 %   Neutro Abs 9.6 (H) 1.7 - 7.7 K/uL   Lymphocytes Relative 16 %   Lymphs Abs 2.1 0.7 - 4.0 K/uL   Monocytes Relative 8 %   Monocytes Absolute 1.0 0.1 - 1.0 K/uL   Eosinophils Relative 3 %   Eosinophils Absolute 0.4 0.0 - 0.5 K/uL   Basophils Relative 0 %   Basophils Absolute 0.1 0.0 - 0.1 K/uL     Immature Granulocytes 1 %   Abs Immature Granulocytes 0.08 (H) 0.00 - 0.07 K/uL  Resp Panel by RT-PCR (Flu A&B, Covid) Nasopharyngeal Swab     Status: None   Collection Time: 07/31/21  5:54 PM   Specimen: Nasopharyngeal Swab; Nasopharyngeal(NP) swabs in vial transport medium  Result Value Ref Range   SARS Coronavirus 2 by RT PCR NEGATIVE NEGATIVE   Influenza A by PCR NEGATIVE NEGATIVE   Influenza B by PCR NEGATIVE NEGATIVE  CBG  monitoring, ED     Status: None   Collection Time: 07/31/21  9:37 PM  Result Value Ref Range   Glucose-Capillary 85 70 - 99 mg/dL  CBG monitoring, ED     Status: None   Collection Time: 08/01/21 12:16 AM  Result Value Ref Range   Glucose-Capillary 89 70 - 99 mg/dL  Troponin I (High Sensitivity)     Status: None   Collection Time: 08/01/21 12:20 AM  Result Value Ref Range   Troponin I (High Sensitivity) 4 <18 ng/L  CBG monitoring, ED     Status: None   Collection Time: 08/01/21  4:02 AM  Result Value Ref Range   Glucose-Capillary 74 70 - 99 mg/dL  CBC     Status: Abnormal   Collection Time: 08/01/21  5:01 AM  Result Value Ref Range   WBC 11.1 (H) 4.0 - 10.5 K/uL   RBC 4.14 3.87 - 5.11 MIL/uL   Hemoglobin 11.7 (L) 12.0 - 15.0 g/dL   HCT 09.3 81.8 - 29.9 %   MCV 91.3 80.0 - 100.0 fL   MCH 28.3 26.0 - 34.0 pg   MCHC 31.0 30.0 - 36.0 g/dL   RDW 37.1 69.6 - 78.9 %   Platelets 257 150 - 400 K/uL   nRBC 0.0 0.0 - 0.2 %  Troponin I (High Sensitivity)     Status: None   Collection Time: 08/01/21  5:12 AM  Result Value Ref Range   Troponin I (High Sensitivity) 5 <18 ng/L  Brain natriuretic peptide     Status: None   Collection Time: 08/01/21  5:12 AM  Result Value Ref Range   B Natriuretic Peptide 28.2 0.0 - 100.0 pg/mL  CBG monitoring, ED     Status: Abnormal   Collection Time: 08/01/21  9:06 AM  Result Value Ref Range   Glucose-Capillary 67 (L) 70 - 99 mg/dL  CBG monitoring, ED     Status: Abnormal   Collection Time: 08/01/21  9:37 AM  Result Value Ref Range   Glucose-Capillary 130 (H) 70 - 99 mg/dL    Imaging or Labs ordered: None  Medical history and chart was reviewed and case discussed with medical provider.  Assessment/Plan: 60 year old female with diabetic neuropathy with Charcot arthropathy of her ankle status post TTC nail now with a periprosthetic tibial shaft fracture.  I discussed with her recommendations including open reduction internal fixation.  Plan for a  percutaneous plate along her medial face of her tibia.  I discussed with her the risk of surgery including risk of infection, bleeding, malunion, nonunion, hardware failure, hardware irritation, nerve or blood vessel injury, DVT, even the possibility anesthetic complication.  She agreed to proceed with surgery and consent was obtained.  We will plan for nonweightbearing postoperatively.  Unfortunately with her insurance company in the living situation this may be difficult to maintain nonweightbearing precautions for the entirety of the 6 weeks however I would encourage [place] to pursue this.  Roby Lofts, MD [facility] [phone])  [phone] (office) [email or web address]

## 2021-08-01 NOTE — Anesthesia Preprocedure Evaluation (Addendum)
Anesthesia Evaluation  Patient identified by MRN, date of birth, ID band Patient awake    Reviewed: Allergy & Precautions, NPO status , Patient's Chart, lab work & pertinent test results, reviewed documented beta blocker date and time   Airway Mallampati: IV  TM Distance: >3 FB Neck ROM: Full    Dental  (+) Dental Advisory Given, Poor Dentition, Missing, Edentulous Upper   Pulmonary shortness of breath and with exertion, sleep apnea and Continuous Positive Airway Pressure Ventilation , COPD,  COPD inhaler, Current Smoker and Patient abstained from smoking.,  Current every smoker, 25 pack year history   Acute onset cough, all testing negative    Pulmonary exam normal breath sounds clear to auscultation       Cardiovascular hypertension, Pt. on medications and Pt. on home beta blockers + CAD (mild nonobstructve on cath recently), + Past MI and +CHF (diastolic)  Normal cardiovascular exam Rhythm:Regular Rate:Normal  LHC done 05/17/2020 (Care Everywhere) showing normal coronary arteries with minimal detectable atherosclerosis; normal LV systolic function, EF 60 to 65% with no regional wall motion abnormalities.  EKG showing sinus rhythm and T wave abnormalities in leads III and V6 which appear new compared to prior tracing from 05/28/2021.  Patient is a poor historian, endorsing substernal chest pain, unclear when it started.   Neuro/Psych  Headaches, PSYCHIATRIC DISORDERS Anxiety Depression    GI/Hepatic Neg liver ROS, PUD, GERD  Controlled and Medicated,  Endo/Other  diabetes, Well Controlled, Type 2, Insulin DependentMorbid obesityBMI 57 a1c 8.0  Renal/GU CRFRenal diseaseCr 1.05  negative genitourinary   Musculoskeletal  (+) Arthritis , Osteoarthritis,    Abdominal (+) + obese,   Peds  Hematology negative hematology ROS (+) hct 37.8   Anesthesia Other Findings   Reproductive/Obstetrics negative OB ROS                             Anesthesia Physical Anesthesia Plan  ASA: 4  Anesthesia Plan: General and Regional   Post-op Pain Management: GA combined w/ Regional for post-op pain   Induction: Intravenous  PONV Risk Score and Plan: Ondansetron, Dexamethasone, Midazolam and Treatment may vary due to age or medical condition  Airway Management Planned: LMA and Oral ETT  Additional Equipment: None  Intra-op Plan:   Post-operative Plan: Extubation in OR  Informed Consent: I have reviewed the patients History and Physical, chart, labs and discussed the procedure including the risks, benefits and alternatives for the proposed anesthesia with the patient or authorized representative who has indicated his/her understanding and acceptance.     Dental advisory given  Plan Discussed with: CRNA  Anesthesia Plan Comments:        Anesthesia Quick Evaluation

## 2021-08-01 NOTE — Progress Notes (Signed)
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 heart cath 05/17/2020, COPD not on oxygen, HF PEF, IDDM + neuropathy Prior hospitalization 812-05/26/2021 right great toe osteomyelitis has non-STEMI pulmonary edema--supposed to be treated for 4 weeks with IV ceftriaxone Admitted 8/24 through 8/31 with bimalleolar fracture, ankle with dislocation underwent ORIF 8/25 current daily smoker Patient developed hardware failure and Charcot arthropathy with tibial talar joint and conservative measures were not successful--readmitted 9/7 through 06/25/2021 and underwent fusion left tibiotalar joint and left subtalar joint and fusion; discharged to SNF In the ED on 10/18 feeling a pop on her left ankle-was sent home from the ED--was told to follow-up with Dr. Josiah Lobo she lives alone she has had to ambulate and has been bearing weight on  Admitted for need for ORIF of left ankle Found to have cough chest pain but work-up negative  Hospital-Problem based course  Acute fracture distal tibia at proximal IM nail Will need ORIF left ankle Cleared from my perspective for procedure--her cough is likely from smoking and I do not see any signs of infectious etiology Pain control Percocet 1 tab 10/325 every 4 as needed moderate pain, for severe pain morphine 1 mg every 4 as needed Continue antibiotics clindamycin can be tailored if felt appropriate by surgery-I think her erythema is probably from the fracture and probably not infectious Current smoker COPD not on oxygen Continue Symbicort 2 puffs twice daily as needed, mometasone 2 puffs twice daily Nonobstructive CAD on cath 8/13, HFpEF Continue metoprolol 50 twice daily, hydralazine 25 3 times daily, amlodipine 10 Add back Jardiance 25 daily Careful with volume currently on saline 50 cc/H IDDM on  insulin with neuropathy N.p.o. at current time Holding home Lantus only use sliding scale coverage-resumed Jardiance Continue Lyrica to 25 3 times daily Superobesity BMI 56 Will need outpatient discussion about treatment and about weight loss Reports of chest pain, cough EKG reviewed--history not concerning for angina-states pain is when the patient coughs-troponin is negative and EKG shows no T wave inversions-monitor Depression Continue bupropion 200 twice daily Cymbalta 90 daily lurasidone 40 daily Seroquel 100 at bedtime and trazodone 200 at bedtime-May need to minimize this polypharmacy in the outpatient setting  DVT prophylaxis: Lovenox Code Status: Full Family Communication: Patient alone Disposition:  Status is: Inpatient  Remains inpatient appropriate because: Needs surgery      Consultants:  Orthopedics Dr. Jena Gauss  Procedures: None yet  Antimicrobials: Clindamycin from admission   Subjective: "My pain is 15/10" Tells me he lives alone-no new other issues no overt chest pain except when coughing no nausea no vomiting Is n.p.o. for procedure  Objective: Vitals:   08/01/21 0350 08/01/21 0400 08/01/21 0445 08/01/21 0600  BP: (!) 133/48 (!) 129/51  137/62  Pulse: 96 92 89 89  Resp: 16 (P) 14  16  Temp: 98.7 F (37.1 C)     TempSrc: Oral     SpO2: 95% 94% 94% 94%  Weight:      Height:        Intake/Output Summary (Last 24 hours) at 08/01/2021 0752 Last data filed at 08/01/2021 0445 Gross per 24 hour  Intake 100 ml  Output --  Net 100 ml   Filed Weights   07/31/21 1622  Weight: (!) 158.8 kg    Examination:  Awake superobese female no distress thick neck cannot appreciate thyromegaly submandibular  lymphadenopathy S1-S2 no murmur Chest exam limited by lack of patient mobility however and laterally and anteriorly is clear Abdomen is very obese cannot appreciate organomegaly secondary to habitus Right lower extremity slightly swollen abscess left  left lower extremity especially ankle option seems somewhat red-she is able to plantarflex cannot really dorsiflex Otherwise power is 5/5  Data Reviewed: personally reviewed   CBC    Component Value Date/Time   WBC 11.1 (H) 08/01/2021 0501   RBC 4.14 08/01/2021 0501   HGB 11.7 (L) 08/01/2021 0501   HCT 37.8 08/01/2021 0501   PLT 257 08/01/2021 0501   MCV 91.3 08/01/2021 0501   MCH 28.3 08/01/2021 0501   MCHC 31.0 08/01/2021 0501   RDW 14.6 08/01/2021 0501   LYMPHSABS 2.1 07/31/2021 1754   MONOABS 1.0 07/31/2021 1754   EOSABS 0.4 07/31/2021 1754   BASOSABS 0.1 07/31/2021 1754   CMP Latest Ref Rng & Units 07/31/2021 06/25/2021 06/18/2021  Glucose 70 - 99 mg/dL 83 - -  BUN 6 - 20 mg/dL 12 - -  Creatinine 5.40 - 1.00 mg/dL 0.86(P) 6.19(J) 0.93(O)  Sodium 135 - 145 mmol/L 133(L) - -  Potassium 3.5 - 5.1 mmol/L 4.3 - -  Chloride 98 - 111 mmol/L 98 - -  CO2 22 - 32 mmol/L 24 - -  Calcium 8.9 - 10.3 mg/dL 6.7(T) - -  Total Protein 6.0 - 8.3 g/dL - - -  Total Bilirubin 0.3 - 1.2 mg/dL - - -  Alkaline Phos 39 - 117 U/L - - -  AST 0 - 37 U/L - - -  ALT 0 - 35 U/L - - -     Radiology Studies: DG Tibia/Fibula Left  Result Date: 07/31/2021 CLINICAL DATA:  Fracture EXAM: LEFT TIBIA AND FIBULA - 2 VIEW COMPARISON:  07/31/2021 earlier today FINDINGS: Distal tibial intramedullary nail again noted extending through the talus, calcaneus and into the tibia. Oblique fracture noted in the distal tibial shaft just above the intramedullary nail. This is unchanged since prior study. Prior resection of distal fibula. IMPRESSION: No significant change in the alignment across the oblique fracture of the distal tibial shaft just above the intramedullary nail. Electronically Signed   By: Charlett Nose M.D.   On: 07/31/2021 19:00   DG Ankle Complete Left  Result Date: 07/31/2021 CLINICAL DATA:  Ankle injury. EXAM: LEFT ANKLE COMPLETE - 3+ VIEW COMPARISON:  Left ankle x-ray 06/11/2021. FINDINGS:  Overlying cast obscures fine bony detail. There is a distal tibial intramedullary nail extending into the talus and calcaneus with multiple screws. There is new acute oblique fracture at the proximal tip of the intramedullary nail. Fracture fragments are distracted 3 mm. The hardware appears intact. Distal fibular resection appears unchanged. There is no gross dislocation. IMPRESSION: 1. Acute fracture of the distal tibia at the level of the proximal intramedullary nail. 2. Hardware otherwise appears unchanged. Electronically Signed   By: Darliss Cheney M.D.   On: 07/31/2021 17:24   CT TIBIA FIBULA LEFT WO CONTRAST  Result Date: 07/31/2021 CLINICAL DATA:  Evaluate left tibial fracture EXAM: CT OF THE LOWER LEFT EXTREMITY WITHOUT CONTRAST TECHNIQUE: Multidetector CT imaging of the lower left extremity was performed according to the standard protocol. COMPARISON:  X-ray 07/31/2021, 07/22/2021.  CT 01/09/2021 FINDINGS: Bones/Joint/Cartilage Postsurgical changes from tibiotalocalcaneal fusion with retrograde nail and screw hardware. Acute perihardware fracture centered at the tip of the nail within the distal tibial diaphysis. There is minimal lateral displacement at the fracture site. A nondisplaced oblique  fracture component extends distally to the level of the distal metadiaphysis (series 13, image 55). Extensive postsurgical changes with evidence of prior hardware removal in the distal tibia and fibula with chronic fracture deformities. There is mild lucency surrounding the nail and screws, particularly within the calcaneus compatible with loosening. Marked bony fragmentation at the tibiotalar joint with collapse of the talar dome. The lateral malleolus has been resected. There is numerous densities within the resection bed likely bone cement or spacers. Advanced midfoot arthropathy is similar in appearance to prior. Ligaments Suboptimally assessed by CT. Muscles and Tendons Fatty atrophy of the lower leg  musculature. Tendinous structures are not well evaluated, but appear grossly intact. Soft tissues Circumferential soft tissue swelling about the lower leg and foot. Prominent soft tissue thickening surrounds the tibiotalar joint. No organized fluid collection. No soft tissue gas. IMPRESSION: 1. Postsurgical changes from tibiotalocalcaneal fusion with retrograde nail and screw hardware. Acute perihardware fracture centered at the tip of the nail within the distal tibial diaphysis with minimal lateral displacement at the fracture site. A nondisplaced oblique fracture component extends distally to the level of the distal metadiaphysis. 2. Extensive postsurgical changes with evidence of prior hardware removal in the distal tibia and fibula with chronic fracture deformities. Marked bony fragmentation at the tibiotalar joint with collapse of the talar dome. Prominent soft tissue thickening surrounding the tibiotalar joint, which could reflect fibrotic changes secondary to surgery or possibly related to underlying infection. 3. Circumferential soft tissue swelling about the lower leg and foot without organized fluid collection or soft tissue gas. Electronically Signed   By: Duanne Guess D.O.   On: 07/31/2021 20:04   DG CHEST PORT 1 VIEW  Result Date: 07/31/2021 CLINICAL DATA:  Cough EXAM: PORTABLE CHEST 1 VIEW COMPARISON:  05/28/2020 FINDINGS: Heart is borderline in size. No confluent opacities, effusions or edema. No acute bony abnormality. IMPRESSION: No acute cardiopulmonary disease. Electronically Signed   By: Charlett Nose M.D.   On: 07/31/2021 23:05   VAS [place] LOWER EXTREMITY VENOUS (DVT) (ONLY [facility] & [facility])  Result Date: 07/31/2021  Lower Venous DVT Study Patient Name:  ALIXANDRA BIBA Wyke  Date of Exam:   07/31/2021 Medical Rec #: [id number]      Accession #:    [id number] Date of Birth: May 07, 1961     Patient Gender: F Patient Age:   65 years Exam Location:  [facility] Procedure:      VAS [place] LOWER  EXTREMITY VENOUS (DVT) Referring Phys: ABIGAIL HARRIS --------------------------------------------------------------------------------  Indications: New trauma to left ankle s/p ORIF.  Limitations: Body habitus. Comparison       07-22-2021 Bilateral lower extremity venous was negative for Study:           DVT. Performing Technologist: Jean Rosenthal RDMS, RVT  Examination Guidelines: A complete evaluation includes B-mode imaging, spectral Doppler, color Doppler, and power Doppler as needed of all accessible portions of each vessel. Bilateral testing is considered an integral part of a complete examination. Limited examinations for reoccurring indications may be performed as noted. The reflux portion of the exam is performed with the patient in reverse Trendelenburg.  +---------+---------------+---------+-----------+----------+--------------+ LEFT     CompressibilityPhasicitySpontaneityPropertiesThrombus Aging +---------+---------------+---------+-----------+----------+--------------+ CFV      Full           Yes      Yes                                 +---------+---------------+---------+-----------+----------+--------------+  SFJ      Full                                                        +---------+---------------+---------+-----------+----------+--------------+ FV Prox  Full                                                        +---------+---------------+---------+-----------+----------+--------------+ FV Mid   Full           Yes      Yes                                 +---------+---------------+---------+-----------+----------+--------------+ FV Distal               Yes      Yes                                 +---------+---------------+---------+-----------+----------+--------------+ PFV      Full                                                        +---------+---------------+---------+-----------+----------+--------------+ POP      Full           Yes       Yes                                 +---------+---------------+---------+-----------+----------+--------------+ PTV                     Yes      Yes                                 +---------+---------------+---------+-----------+----------+--------------+ PERO                    Yes      Yes                                 +---------+---------------+---------+-----------+----------+--------------+     Summary: LEFT: - There is no evidence of deep vein thrombosis in the lower extremity. However, portions of this examination were limited- see technologist comments above.  - No cystic structure found in the popliteal fossa.  *See table(s) above for measurements and observations.    Preliminary      Scheduled Meds:  amLODipine  10 mg Oral Daily   atorvastatin  10 mg Oral Daily   buPROPion  200 mg Oral BID   DULoxetine  90 mg Oral Daily   hydrALAZINE  25 mg Oral TID   insulin aspart  0-9 Units Subcutaneous Q4H   lurasidone  40 mg Oral Daily   metoprolol tartrate  50 mg Oral BID  mometasone-formoterol  2 puff Inhalation BID   pantoprazole  40 mg Oral Daily   polyethylene glycol  17 g Oral Daily   pregabalin  225 mg Oral TID   QUEtiapine  100 mg Oral QHS   traZODone  200 mg Oral QHS   Continuous Infusions:  sodium chloride 50 mL/hr at 08/01/21 0350   clindamycin (CLEOCIN) IV Stopped (08/01/21 0445)     LOS: 1 day   Time spent: 10  Rhetta Mura, MD [facility] To contact the attending provider between 7A-7P or the covering provider during after hours 7P-7A, please log into the web site [email or web address] and access using universal [place] password for that web site. If you do not have the password, please call the hospital operator.  08/01/2021, 7:53 AM

## 2021-08-01 NOTE — Progress Notes (Signed)
Admission from PACU by bed awake and alert. 

## 2021-08-01 NOTE — Anesthesia Postprocedure Evaluation (Signed)
Anesthesia Post Note  Patient: [name]  Procedure(s) Performed: OPEN REDUCTION INTERNAL FIXATION (ORIF) TIBIA FRACTURE (Left)     Patient location during evaluation: PACU Anesthesia Type: Regional and General Level of consciousness: awake and alert, oriented and patient cooperative Pain management: pain level controlled Vital Signs Assessment: post-procedure vital signs reviewed and stable Respiratory status: spontaneous breathing, nonlabored ventilation and respiratory function stable Cardiovascular status: blood pressure returned to baseline and stable Postop Assessment: no apparent nausea or vomiting Anesthetic complications: no   No notable events documented.  Last Vitals:  Vitals:   08/01/21 1304 08/01/21 1319  BP: 139/81 (!) 141/58  Pulse: 86 84  Resp: 10 14  Temp:    SpO2: 95% 94%    Last Pain:  Vitals:   08/01/21 1249  TempSrc:   PainSc: 0-No pain                 Lannie Fields

## 2021-08-01 NOTE — Interval H&P Note (Signed)
History and Physical Interval Note:  08/01/2021 10:09 AM  [name]  has presented today for surgery, with the diagnosis of LEft periprosthetic tibia fracture.  The various methods of treatment have been discussed with the patient and family. After consideration of risks, benefits and other options for treatment, the patient has consented to  Procedure(s): OPEN REDUCTION INTERNAL FIXATION (ORIF) TIBIA FRACTURE (Left) as a surgical intervention.  The patient's history has been reviewed, patient examined, no change in status, stable for surgery.  I have reviewed the patient's chart and labs.  Questions were answered to the patient's satisfaction.     Caryn Bee P Kendy Haston

## 2021-08-01 NOTE — Anesthesia Procedure Notes (Signed)
Procedure Name: LMA Insertion Date/Time: 08/01/2021 10:53 AM Performed by: Jodell Cipro, CRNA Pre-anesthesia Checklist: Patient identified, Emergency Drugs available, Suction available and Patient being monitored Patient Re-evaluated:Patient Re-evaluated prior to induction Oxygen Delivery Method: Circle System Utilized Preoxygenation: Pre-oxygenation with 100% oxygen Induction Type: IV induction Ventilation: Mask ventilation without difficulty LMA: LMA inserted LMA Size: 4.0 Number of attempts: 1 Airway Equipment and Method: Bite block Placement Confirmation: positive ETCO2 Tube secured with: Tape Dental Injury: Teeth and Oropharynx as per pre-operative assessment

## 2021-08-01 NOTE — Transfer of Care (Signed)
Immediate Anesthesia Transfer of Care Note  Patient: [name]  Procedure(s) Performed: OPEN REDUCTION INTERNAL FIXATION (ORIF) TIBIA FRACTURE (Left)  Patient Location: PACU  Anesthesia Type:General  Level of Consciousness: awake, alert  and oriented  Airway & Oxygen Therapy: Patient Spontanous Breathing and Patient connected to nasal cannula oxygen  Post-op Assessment: Report given to RN and Post -op Vital signs reviewed and stable  Post vital signs: Reviewed and stable  Last Vitals:  Vitals Value Taken Time  BP 158/80 08/01/21 1218  Temp 37.2 C 08/01/21 1219  Pulse 84 08/01/21 1225  Resp 13 08/01/21 1225  SpO2 97 % 08/01/21 1225  Vitals shown include unvalidated device data.  Last Pain:  Vitals:   08/01/21 1003  TempSrc: Oral  PainSc:          Complications: No notable events documented.

## 2021-08-01 NOTE — Anesthesia Procedure Notes (Signed)
Anesthesia Regional Block: Popliteal block   Pre-Anesthetic Checklist: , timeout performed,  Correct Patient, Correct Site, Correct Laterality,  Correct Procedure, Correct Position, site marked,  Risks and benefits discussed,  Surgical consent,  Pre-op evaluation,  At surgeon's request and post-op pain management  Laterality: Left  Prep: Maximum Sterile Barrier Precautions used, chloraprep       Needles:  Injection technique: Single-shot  Needle Type: Echogenic Stimulator Needle     Needle Length: 9cm  Needle Gauge: 22     Additional Needles:   Procedures:,,,, ultrasound used (permanent image in chart),,    Narrative:  Start time: 08/01/2021 10:15 AM End time: 08/01/2021 10:20 AM Injection made incrementally with aspirations every 5 mL.  Performed by: Personally  Anesthesiologist: Lannie Fields, DO  Additional Notes: Monitors applied. No increased pain on injection. No increased resistance to injection. Injection made in 5cc increments. Good needle visualization. Patient tolerated procedure well.

## 2021-08-01 NOTE — Anesthesia Procedure Notes (Signed)
Anesthesia Regional Block: Femoral nerve block   Pre-Anesthetic Checklist: , timeout performed,  Correct Patient, Correct Site, Correct Laterality,  Correct Procedure, Correct Position, site marked,  Risks and benefits discussed,  Surgical consent,  Pre-op evaluation,  At surgeon's request and post-op pain management  Laterality: Left  Prep: Maximum Sterile Barrier Precautions used, chloraprep       Needles:  Injection technique: Single-shot  Needle Type: Echogenic Stimulator Needle     Needle Length: 9cm  Needle Gauge: 22     Additional Needles:   Procedures:,,,, ultrasound used (permanent image in chart),,    Narrative:  Start time: 08/01/2021 10:20 AM End time: 08/01/2021 10:25 AM Injection made incrementally with aspirations every 5 mL.  Performed by: Personally  Anesthesiologist: Lannie Fields, DO  Additional Notes: Monitors applied. No increased pain on injection. No increased resistance to injection. Injection made in 5cc increments. Good needle visualization. Patient tolerated procedure well.

## 2021-08-01 NOTE — Consult Note (Signed)
Orthopaedic Trauma Service (OTS) Consult   Patient ID: [name] MRN: [id number] DOB/AGE: May 21, 1961 60 y.o.  Reason for Consult:Left periprosthetic tibia fracture  HPI: [name] is an 60 y.o. female who presents with a periprosthetic left tibia fracture.  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  She has diabetic neuropathy.  Surgery had gone well and she was compliant with nonweightbearing precautions while she was at the nursing facility.  Unfortunately her insurance company was unable to pay for any more state and she was discharged to home prior to her full recommended course of nonweightbearing.  As result of this she had to start putting weight on her leg early and about a week and a half ago she twisted her ankle while walking to the bathroom felt a pop and immediate pain.  Nothing was found on the initial x-rays and she presented to our office earlier this week.  A nondisplaced periprosthetic fracture was identified.  She was placed in a splint and made nonweightbearing.  However she lives alone and is relying on weightbearing she put weight on it yesterday and had a immediate pop and severe pain.  X-ray showed a periprosthetic fracture that had displaced.  Patient was seen in the preoperative holding area.  She unfortunately is having a lot of pain.  Denies any pain anywhere else.  Otherwise she has been in decent health.  She is a smoker.  She did have some chest pain yesterday evening which she was worked up with troponins as well as a chest x-ray.  Hospitalist today Dr. Mahala Menghini has cleared her for proceeding without further intervention with echocardiogram  Past Medical History:  Diagnosis Date   Anemia    iron deficiency   Anxiety    Arthritis    Chronic kidney disease    COPD (chronic obstructive pulmonary disease) (HCC)    Depression    Diabetes mellitus    type 2   Diabetic foot ulcer  (HCC) 04/2018   right ankle   Dyspnea    GERD (gastroesophageal reflux disease)    Headache(784.0)    History of colonic polyps    Hyperlipidemia    Hypertension    Myocardial infarction (HCC)    Neuropathy    Peptic ulcer disease    Sleep apnea     Past Surgical History:  Procedure Laterality Date   ANKLE FUSION Left 06/11/2021   Procedure: ANKLE FUSION;  Surgeon: Roby Lofts, MD;  Location: [facility] OR;  Service: Orthopedics;  Laterality: Left;   ESOPHAGOGASTRODUODENOSCOPY  10/11/2011   Procedure: ESOPHAGOGASTRODUODENOSCOPY (EGD);  Surgeon: Erick Blinks, MD;  Location: [facility] ENDOSCOPY;  Service: Gastroenterology;  Laterality: N/A;   ORIF ANKLE FRACTURE Left 05/29/2020   Procedure: OPEN REDUCTION INTERNAL FIXATION (ORIF) ANKLE FRACTURE;  Surgeon: Roby Lofts, MD;  Location: [facility] OR;  Service: Orthopedics;  Laterality: Left;   TUBAL LIGATION      Family History  Problem Relation Age of Onset   Alcohol abuse Other    Drug abuse Other    Arthritis Other    Diabetes Other    Depression Other    Hypertension Other    Kidney disease Other    Cancer Other        Breast,Colon,Lung, and Prostate Cancer   Stomach cancer Father 55   Heart attack Brother 68   Diabetes type II Other        brother, both  parents    Social History:  reports that she has been smoking cigarettes. She has a 25.00 pack-year smoking history. She has never used smokeless tobacco. She reports that she does not drink alcohol and does not use drugs.  Allergies:  Allergies  Allergen Reactions   Ceftriaxone Diarrhea and Nausea And Vomiting    Medications:  No current facility-administered medications on file prior to encounter.   Current Outpatient Medications on File Prior to Encounter  Medication Sig Dispense Refill   acetaminophen (TYLENOL) 500 MG tablet Take 1,000 mg by mouth every 6 (six) hours as needed for moderate pain.     amLODipine (NORVASC) 10 MG tablet Take 10 mg by mouth daily.     atorvastatin  (LIPITOR) 10 MG tablet Take 10 mg by mouth daily.     buPROPion (WELLBUTRIN SR) 200 MG 12 hr tablet Take 200 mg by mouth 2 (two) times daily.  0   DULoxetine (CYMBALTA) 30 MG capsule Take 30 mg by mouth daily.  2   DULoxetine (CYMBALTA) 60 MG capsule Take 60 mg by mouth daily.      HUMALOG KWIKPEN 100 UNIT/ML KiwkPen Inject 12-20 Units as directed in the morning and at bedtime. Per sliding scale     hydrALAZINE (APRESOLINE) 25 MG tablet Take 25 mg by mouth 3 (three) times daily.     insulin glargine (LANTUS) 100 UNIT/ML injection Inject 0.6 mLs (60 Units total) into the skin 2 (two) times daily. (Patient taking differently: Inject 100 Units into the skin 2 (two) times daily.) 10 mL 11   JARDIANCE 25 MG TABS tablet Take 25 mg by mouth daily.     lurasidone (LATUDA) 40 MG TABS tablet Take 40 mg by mouth daily.  0   LYRICA 225 MG capsule Take 225 mg by mouth 3 (three) times daily.     meloxicam (MOBIC) 7.5 MG tablet Take 7.5 mg by mouth at bedtime.     metoprolol tartrate (LOPRESSOR) 50 MG tablet Take 50 mg by mouth 2 (two) times daily.     Multiple Vitamin (MULTI-DAY VITAMINS PO) Take 1 tablet by mouth daily.     omeprazole (PRILOSEC) 40 MG capsule Take 40 mg by mouth daily.     Oxycodone HCl 10 MG TABS Take 1 tablet (10 mg total) by mouth every 6 (six) hours as needed. (Patient taking differently: Take 20 mg by mouth every 6 (six) hours as needed.) 15 tablet 0   polyethylene glycol (MIRALAX / GLYCOLAX) 17 g packet Take 17 g by mouth daily as needed for mild constipation. 14 each 0   QUEtiapine (SEROQUEL) 100 MG tablet Take 100 mg by mouth at bedtime.  0   Semaglutide, 1 MG/DOSE, (OZEMPIC, 1 MG/DOSE,) 2 MG/1.5ML SOPN Inject 1 mg into the skin every 7 (seven) days.     SYMBICORT 80-4.5 MCG/ACT inhaler Inhale 2 puffs into the lungs 2 (two) times daily as needed (shortness of breath or wheezing).     traZODone (DESYREL) 100 MG tablet Take 200 mg by mouth at bedtime.     bisacodyl (DULCOLAX) 10 MG  suppository Place 1 suppository (10 mg total) rectally daily as needed for moderate constipation. (Patient not taking: No sig reported) 12 suppository 0   enoxaparin (LOVENOX) 80 MG/0.8ML injection Inject 0.8 mLs (80 mg total) into the skin daily for 21 days. 16.8 mL 0   oxyCODONE (OXY IR/ROXICODONE) 5 MG immediate release tablet Take 1-2 tablets (5-10 mg total) by mouth every 6 (six) hours as needed  for breakthrough pain (breakthrough pain between Percocet doses). (Patient not taking: No sig reported) 30 tablet 0   oxyCODONE-acetaminophen (PERCOCET) 10-325 MG tablet Take 1 tablet by mouth every 4 (four) hours as needed for pain. (Patient not taking: No sig reported) 42 tablet 0     ROS: Constitutional: No fever or chills Vision: No changes in vision ENT: No difficulty swallowing CV: No chest pain Pulm: No SOB or wheezing GI: No nausea or vomiting GU: No urgency or inability to hold urine Skin: No poor wound healing Neurologic: No numbness or tingling Psychiatric: No depression or anxiety Heme: No bruising Allergic: No reaction to medications or food   Exam: Blood pressure 110/69, pulse 88, temperature 98.4 F (36.9 C), temperature source Oral, resp. rate 15, height  (1.676 m), weight (!) 158.8 kg, SpO2 94 %. General: No acute distress Orientation: Awake alert and oriented x3 Mood and Affect: Cooperative and pleasant Gait: Unable to assess due to her fracture Coordination and balance: Within normal limits.  Left lower extremity reveals well-healed incisions.  Mild area.  Moderate swelling.  No obvious deformity.  She is able to wiggle her toes.  Sensation is diminished secondary to her neuropathy.  She is warm well perfused foot.  Right lower extremity: Skin without lesions. No tenderness to palpation. Full painless ROM, full strength in each muscle groups without evidence of instability.   Medical Decision Making: Data: Imaging: X-rays of the left tibia and CT scan shows a  periprosthetic proximal tibia fracture that is propagated from the location of the interlocking screws for the TTC nail.  Fixation at the location of the ankle is in place without any signs of hardware failure or loosening.  Labs:  Results for orders placed or performed during the hospital encounter of 07/31/21 (from the past 24 hour(s))  Basic metabolic panel     Status: Abnormal   Collection Time: 07/31/21  5:54 PM  Result Value Ref Range   Sodium 133 (L) 135 - 145 mmol/L   Potassium 4.3 3.5 - 5.1 mmol/L   Chloride 98 98 - 111 mmol/L   CO2 24 22 - 32 mmol/L   Glucose, Bld 83 70 - 99 mg/dL   BUN 12 6 - 20 mg/dL   Creatinine, Ser 1.61 (H) 0.44 - 1.00 mg/dL   Calcium 8.8 (L) 8.9 - 10.3 mg/dL   GFR, Estimated >09 >60 mL/min   Anion gap 11 5 - 15  CBC with Differential     Status: Abnormal   Collection Time: 07/31/21  5:54 PM  Result Value Ref Range   WBC 13.2 (H) 4.0 - 10.5 K/uL   RBC 4.31 3.87 - 5.11 MIL/uL   Hemoglobin 12.2 12.0 - 15.0 g/dL   HCT 45.4 09.8 - 11.9 %   MCV 93.5 80.0 - 100.0 fL   MCH 28.3 26.0 - 34.0 pg   MCHC 30.3 30.0 - 36.0 g/dL   RDW 14.7 82.9 - 56.2 %   Platelets 270 150 - 400 K/uL   nRBC 0.0 0.0 - 0.2 %   Neutrophils Relative % 72 %   Neutro Abs 9.6 (H) 1.7 - 7.7 K/uL   Lymphocytes Relative 16 %   Lymphs Abs 2.1 0.7 - 4.0 K/uL   Monocytes Relative 8 %   Monocytes Absolute 1.0 0.1 - 1.0 K/uL   Eosinophils Relative 3 %   Eosinophils Absolute 0.4 0.0 - 0.5 K/uL   Basophils Relative 0 %   Basophils Absolute 0.1 0.0 - 0.1 K/uL  Immature Granulocytes 1 %   Abs Immature Granulocytes 0.08 (H) 0.00 - 0.07 K/uL  Resp Panel by RT-PCR (Flu A&B, Covid) Nasopharyngeal Swab     Status: None   Collection Time: 07/31/21  5:54 PM   Specimen: Nasopharyngeal Swab; Nasopharyngeal(NP) swabs in vial transport medium  Result Value Ref Range   SARS Coronavirus 2 by RT PCR NEGATIVE NEGATIVE   Influenza A by PCR NEGATIVE NEGATIVE   Influenza B by PCR NEGATIVE NEGATIVE  CBG  monitoring, ED     Status: None   Collection Time: 07/31/21  9:37 PM  Result Value Ref Range   Glucose-Capillary 85 70 - 99 mg/dL  CBG monitoring, ED     Status: None   Collection Time: 08/01/21 12:16 AM  Result Value Ref Range   Glucose-Capillary 89 70 - 99 mg/dL  Troponin I (High Sensitivity)     Status: None   Collection Time: 08/01/21 12:20 AM  Result Value Ref Range   Troponin I (High Sensitivity) 4 <18 ng/L  CBG monitoring, ED     Status: None   Collection Time: 08/01/21  4:02 AM  Result Value Ref Range   Glucose-Capillary 74 70 - 99 mg/dL  CBC     Status: Abnormal   Collection Time: 08/01/21  5:01 AM  Result Value Ref Range   WBC 11.1 (H) 4.0 - 10.5 K/uL   RBC 4.14 3.87 - 5.11 MIL/uL   Hemoglobin 11.7 (L) 12.0 - 15.0 g/dL   HCT 09.3 81.8 - 29.9 %   MCV 91.3 80.0 - 100.0 fL   MCH 28.3 26.0 - 34.0 pg   MCHC 31.0 30.0 - 36.0 g/dL   RDW 37.1 69.6 - 78.9 %   Platelets 257 150 - 400 K/uL   nRBC 0.0 0.0 - 0.2 %  Troponin I (High Sensitivity)     Status: None   Collection Time: 08/01/21  5:12 AM  Result Value Ref Range   Troponin I (High Sensitivity) 5 <18 ng/L  Brain natriuretic peptide     Status: None   Collection Time: 08/01/21  5:12 AM  Result Value Ref Range   B Natriuretic Peptide 28.2 0.0 - 100.0 pg/mL  CBG monitoring, ED     Status: Abnormal   Collection Time: 08/01/21  9:06 AM  Result Value Ref Range   Glucose-Capillary 67 (L) 70 - 99 mg/dL  CBG monitoring, ED     Status: Abnormal   Collection Time: 08/01/21  9:37 AM  Result Value Ref Range   Glucose-Capillary 130 (H) 70 - 99 mg/dL    Imaging or Labs ordered: None  Medical history and chart was reviewed and case discussed with medical provider.  Assessment/Plan: 60 year old female with diabetic neuropathy with Charcot arthropathy of her ankle status post TTC nail now with a periprosthetic tibial shaft fracture.  I discussed with her recommendations including open reduction internal fixation.  Plan for a  percutaneous plate along her medial face of her tibia.  I discussed with her the risk of surgery including risk of infection, bleeding, malunion, nonunion, hardware failure, hardware irritation, nerve or blood vessel injury, DVT, even the possibility anesthetic complication.  She agreed to proceed with surgery and consent was obtained.  We will plan for nonweightbearing postoperatively.  Unfortunately with her insurance company in the living situation this may be difficult to maintain nonweightbearing precautions for the entirety of the 6 weeks however I would encourage [place] to pursue this.  Roby Lofts, MD [facility] [phone])  [phone] (office) [email or web address]

## 2021-08-02 ENCOUNTER — Inpatient Hospital Stay (HOSPITAL_COMMUNITY): Payer: Medicare Other

## 2021-08-02 ENCOUNTER — Other Ambulatory Visit (HOSPITAL_COMMUNITY): Payer: Medicaid Other

## 2021-08-02 LAB — BASIC METABOLIC PANEL
Anion gap: 8 (ref 5–15)
BUN: 17 mg/dL (ref 6–20)
CO2: 26 mmol/L (ref 22–32)
Calcium: 8.6 mg/dL — ABNORMAL LOW (ref 8.9–10.3)
Chloride: 101 mmol/L (ref 98–111)
Creatinine, Ser: 1.26 mg/dL — ABNORMAL HIGH (ref 0.44–1.00)
GFR, Estimated: 49 mL/min — ABNORMAL LOW (ref >60)
Glucose, Bld: 227 mg/dL — ABNORMAL HIGH (ref 70–99)
Potassium: 4.9 mmol/L (ref 3.5–5.1)
Sodium: 135 mmol/L (ref 135–145)

## 2021-08-02 LAB — CBC
HCT: 35.7 % — ABNORMAL LOW (ref 36.0–46.0)
Hemoglobin: 10.9 g/dL — ABNORMAL LOW (ref 12.0–15.0)
MCH: 27.7 pg (ref 26.0–34.0)
MCHC: 30.5 g/dL (ref 30.0–36.0)
MCV: 90.8 fL (ref 80.0–100.0)
Platelets: 243 10*3/uL (ref 150–400)
RBC: 3.93 MIL/uL (ref 3.87–5.11)
RDW: 14.4 % (ref 11.5–15.5)
WBC: 9.5 10*3/uL (ref 4.0–10.5)
nRBC: 0 % (ref 0.0–0.2)

## 2021-08-02 LAB — ECHOCARDIOGRAM COMPLETE
AR max vel: 2.21 cm2
AV Area VTI: 2.22 cm2
AV Area mean vel: 2.32 cm2
AV Mean grad: 5 mmHg
AV Peak grad: 9.6 mmHg
Ao pk vel: 1.55 m/s
Area-P 1/2: 4.29 cm2
Height: 66 in
S' Lateral: 3.2 cm
Weight: 5600 oz

## 2021-08-02 LAB — GLUCOSE, CAPILLARY
Glucose-Capillary: 232 mg/dL — ABNORMAL HIGH (ref 70–99)
Glucose-Capillary: 199 mg/dL — ABNORMAL HIGH (ref 70–99)
Glucose-Capillary: 181 mg/dL — ABNORMAL HIGH (ref 70–99)
Glucose-Capillary: 236 mg/dL — ABNORMAL HIGH (ref 70–99)
Glucose-Capillary: 225 mg/dL — ABNORMAL HIGH (ref 70–99)

## 2021-08-02 MED ORDER — SALINE SPRAY 0.65 % NA SOLN
1.0000 | NASAL | Status: DC | PRN
  Filled 2021-08-02: qty 44

## 2021-08-02 MED ORDER — INSULIN GLARGINE-YFGN 100 UNIT/ML ~~LOC~~ SOLN
12.0000 [IU] | Freq: Two times a day (BID) | SUBCUTANEOUS | Status: DC
  Administered 2021-08-02 – 2021-08-05 (×7): 12 [IU] via SUBCUTANEOUS
  Filled 2021-08-02 (×8): qty 0.12

## 2021-08-02 NOTE — Progress Notes (Signed)
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 heart cath 05/17/2020, COPD not on oxygen, HF PEF, IDDM + neuropathy Prior hospitalization 812-05/26/2021 right great toe osteomyelitis has non-STEMI pulmonary edema--supposed to be treated for 4 weeks with IV ceftriaxone Admitted 8/24 through 8/31 with bimalleolar fracture, ankle with dislocation underwent ORIF 8/25 current daily smoker Patient developed hardware failure and Charcot arthropathy with tibial talar joint and conservative measures were not successful--readmitted 9/7 through 06/25/2021 and underwent fusion left tibiotalar joint and left subtalar joint and fusion; discharged to SNF In the ED on 10/18 feeling a pop on her left ankle-was sent home from the ED--was told to follow-up with Dr. Josiah Lobo she lives alone she has had to ambulate and has been bearing weight on  Admitted for need for ORIF of left ankle Found to have cough chest pain but work-up negative  Hospital-Problem based course  Acute fracture distal tibia at proximal IM nail S/p ORIF left ankle Dr. Loney Loh, Therapy assessment recs SNF---Abx per ortho Pain control Percocet 1 tab 10/325 every 4 as needed moderate pain, for severe pain morphine 1 mg every 4 as needed Current smoker COPD not on oxygen Continue Symbicort 2 puffs twice daily as needed, mometasone 2 puffs twice daily Nonobstructive CAD on cath 8/13, HFpEF Continue metoprolol 50 twice daily, hydralazine 25 3 times daily, amlodipine 10 Add back Jardiance 25 daily Can saline lock 10./29 IDDM on insulin with neuropathy resumed Jardiance Cbg 180-232, adding back lantus at lower dose 12 bid [home =60 d] Hold ozempic Continue Lyrica to 25 3 times daily Superobesity BMI 56 Will need outpatient discussion about treatment and about weight loss Reports  of chest pain, cough EKG reviewed--history not concerning for angina-states pain is when the patient coughs-troponin is negative and EKG shows no T wave inversions-monitor Depression Continue bupropion 200 twice daily Cymbalta 90 daily lurasidone 40 daily Seroquel 100 at bedtime and trazodone 200 at bedtime-May need to minimize this polypharmacy in the outpatient setting  DVT prophylaxis: Lovenox Code Status: Full Family Communication: Patient alone Disposition:  Status is: Inpatient  Remains inpatient appropriate because: Needs Skilled placement--probably will happen monday      Consultants:  Orthopedics Dr. Jena Gauss  Procedures: None yet  Antimicrobials: Clindamycin from admission   Subjective:  Pain better Patient seems comfortable No fever chills n/v diarr Asking about her glycemic meds  Objective: Vitals:   08/02/21 0753 08/02/21 0853 08/02/21 0939 08/02/21 1121  BP: (!) 119/56   (!) 122/55  Pulse: 76   83  Resp: 13  12 14   Temp: 97.9 F (36.6 C)   98.4 F (36.9 C)  TempSrc: Oral   Oral  SpO2: 91% 95%  92%  Weight:      Height:        Intake/Output Summary (Last 24 hours) at 08/02/2021 1350 Last data filed at 08/02/2021 1200 Gross per 24 hour  Intake 1865.56 ml  Output 2250 ml  Net -384.44 ml    Filed Weights   07/31/21 1622  Weight: (!) 158.8 kg    Examination:  superobese female no distress thick neck  S1-S2 no murmur Chest exam relatively clear Abdomen is very obese  Right lower extremity slightly swollen  left left lower extremity wrapped in [place] euthymic  Data Reviewed: personally reviewed   CBC    Component Value Date/Time   WBC  9.5 08/02/2021 0015   RBC 3.93 08/02/2021 0015   HGB 10.9 (L) 08/02/2021 0015   HCT 35.7 (L) 08/02/2021 0015   PLT 243 08/02/2021 0015   MCV 90.8 08/02/2021 0015   MCH 27.7 08/02/2021 0015   MCHC 30.5 08/02/2021 0015   RDW 14.4 08/02/2021 0015   LYMPHSABS 2.1 07/31/2021 1754   MONOABS 1.0  07/31/2021 1754   EOSABS 0.4 07/31/2021 1754   BASOSABS 0.1 07/31/2021 1754   CMP Latest Ref Rng & Units 08/02/2021 07/31/2021 06/25/2021  Glucose 70 - 99 mg/dL 867(E) 83 -  BUN 6 - 20 mg/dL 17 12 -  Creatinine 7.20 - 1.00 mg/dL 9.47(S) 9.62(E) 3.66(Q)  Sodium 135 - 145 mmol/L 135 133(L) -  Potassium 3.5 - 5.1 mmol/L 4.9 4.3 -  Chloride 98 - 111 mmol/L 101 98 -  CO2 22 - 32 mmol/L 26 24 -  Calcium 8.9 - 10.3 mg/dL 9.4(T) 6.5(Y) -  Total Protein 6.0 - 8.3 g/dL - - -  Total Bilirubin 0.3 - 1.2 mg/dL - - -  Alkaline Phos 39 - 117 U/L - - -  AST 0 - 37 U/L - - -  ALT 0 - 35 U/L - - -     Radiology Studies: DG Tibia/Fibula Left  Result Date: 08/01/2021 CLINICAL DATA:  Intraoperative fluoroscopic images EXAM: LEFT TIBIA AND FIBULA - 2 VIEW COMPARISON:  None. FINDINGS: Multiple intraoperative fluoroscopic images for left tibia open reduction internal fixation. Total fluoroscopic time of 1 minute and 50 seconds. IMPRESSION: Intraoperative use of fluoroscopy Electronically Signed   By: Larose Hires D.O.   On: 08/01/2021 13:18   DG Tibia/Fibula Left  Result Date: 07/31/2021 CLINICAL DATA:  Fracture EXAM: LEFT TIBIA AND FIBULA - 2 VIEW COMPARISON:  07/31/2021 earlier today FINDINGS: Distal tibial intramedullary nail again noted extending through the talus, calcaneus and into the tibia. Oblique fracture noted in the distal tibial shaft just above the intramedullary nail. This is unchanged since prior study. Prior resection of distal fibula. IMPRESSION: No significant change in the alignment across the oblique fracture of the distal tibial shaft just above the intramedullary nail. Electronically Signed   By: Charlett Nose M.D.   On: 07/31/2021 19:00   DG Ankle Complete Left  Result Date: 07/31/2021 CLINICAL DATA:  Ankle injury. EXAM: LEFT ANKLE COMPLETE - 3+ VIEW COMPARISON:  Left ankle x-ray 06/11/2021. FINDINGS: Overlying cast obscures fine bony detail. There is a distal tibial intramedullary  nail extending into the talus and calcaneus with multiple screws. There is new acute oblique fracture at the proximal tip of the intramedullary nail. Fracture fragments are distracted 3 mm. The hardware appears intact. Distal fibular resection appears unchanged. There is no gross dislocation. IMPRESSION: 1. Acute fracture of the distal tibia at the level of the proximal intramedullary nail. 2. Hardware otherwise appears unchanged. Electronically Signed   By: Darliss Cheney M.D.   On: 07/31/2021 17:24   CT TIBIA FIBULA LEFT WO CONTRAST  Result Date: 07/31/2021 CLINICAL DATA:  Evaluate left tibial fracture EXAM: CT OF THE LOWER LEFT EXTREMITY WITHOUT CONTRAST TECHNIQUE: Multidetector CT imaging of the lower left extremity was performed according to the standard protocol. COMPARISON:  X-ray 07/31/2021, 07/22/2021.  CT 01/09/2021 FINDINGS: Bones/Joint/Cartilage Postsurgical changes from tibiotalocalcaneal fusion with retrograde nail and screw hardware. Acute perihardware fracture centered at the tip of the nail within the distal tibial diaphysis. There is minimal lateral displacement at the fracture site. A nondisplaced oblique fracture component extends distally to the level  of the distal metadiaphysis (series 13, image 55). Extensive postsurgical changes with evidence of prior hardware removal in the distal tibia and fibula with chronic fracture deformities. There is mild lucency surrounding the nail and screws, particularly within the calcaneus compatible with loosening. Marked bony fragmentation at the tibiotalar joint with collapse of the talar dome. The lateral malleolus has been resected. There is numerous densities within the resection bed likely bone cement or spacers. Advanced midfoot arthropathy is similar in appearance to prior. Ligaments Suboptimally assessed by CT. Muscles and Tendons Fatty atrophy of the lower leg musculature. Tendinous structures are not well evaluated, but appear grossly intact.  Soft tissues Circumferential soft tissue swelling about the lower leg and foot. Prominent soft tissue thickening surrounds the tibiotalar joint. No organized fluid collection. No soft tissue gas. IMPRESSION: 1. Postsurgical changes from tibiotalocalcaneal fusion with retrograde nail and screw hardware. Acute perihardware fracture centered at the tip of the nail within the distal tibial diaphysis with minimal lateral displacement at the fracture site. A nondisplaced oblique fracture component extends distally to the level of the distal metadiaphysis. 2. Extensive postsurgical changes with evidence of prior hardware removal in the distal tibia and fibula with chronic fracture deformities. Marked bony fragmentation at the tibiotalar joint with collapse of the talar dome. Prominent soft tissue thickening surrounding the tibiotalar joint, which could reflect fibrotic changes secondary to surgery or possibly related to underlying infection. 3. Circumferential soft tissue swelling about the lower leg and foot without organized fluid collection or soft tissue gas. Electronically Signed   By: Duanne Guess D.O.   On: 07/31/2021 20:04   DG CHEST PORT 1 VIEW  Result Date: 07/31/2021 CLINICAL DATA:  Cough EXAM: PORTABLE CHEST 1 VIEW COMPARISON:  05/28/2020 FINDINGS: Heart is borderline in size. No confluent opacities, effusions or edema. No acute bony abnormality. IMPRESSION: No acute cardiopulmonary disease. Electronically Signed   By: Charlett Nose M.D.   On: 07/31/2021 23:05   DG Tibia/Fibula Left Port  Result Date: 08/01/2021 CLINICAL DATA:  Postop. EXAM: PORTABLE LEFT TIBIA AND FIBULA - 2 VIEW COMPARISON:  Preoperative imaging, including radiographs yesterday. FINDINGS: Previous ankle fusion hardware with intramedullary nail traversing the calcaneus, talus and tibia. There is a new lateral plate and multi screw fixation of periprosthetic fracture. Previous distal fibular resection is again seen. Recent  postsurgical change includes soft tissue edema. IMPRESSION: New lateral plate and screw fixation of periprosthetic fracture. No immediate postoperative complication. Electronically Signed   By: Narda Rutherford M.D.   On: 08/01/2021 14:41   DG C-Arm 1-60 Min-No Report  Result Date: 08/01/2021 Fluoroscopy was utilized by the requesting physician.  No radiographic interpretation.   DG C-Arm 1-60 Min-No Report  Result Date: 08/01/2021 Fluoroscopy was utilized by the requesting physician.  No radiographic interpretation.   VAS [place] LOWER EXTREMITY VENOUS (DVT) (ONLY [facility] & [facility])  Result Date: 08/01/2021  Lower Venous DVT Study Patient Name:  [name] Garde  Date of Exam:   07/31/2021 Medical Rec #: [id number]      Accession #:    [id number] Date of Birth: [birth year]     Patient Gender: F Patient Age:   60 years Exam Location:  [facility] Procedure:      VAS [place] LOWER EXTREMITY VENOUS (DVT) Referring Phys: ABIGAIL HARRIS --------------------------------------------------------------------------------  Indications: New trauma to left ankle s/p ORIF.  Limitations: Body habitus. Comparison       07-22-2021 Bilateral lower extremity venous was negative for Study:  DVT. Performing Technologist: Jean Rosenthal RDMS, RVT  Examination Guidelines: A complete evaluation includes B-mode imaging, spectral Doppler, color Doppler, and power Doppler as needed of all accessible portions of each vessel. Bilateral testing is considered an integral part of a complete examination. Limited examinations for reoccurring indications may be performed as noted. The reflux portion of the exam is performed with the patient in reverse Trendelenburg.  +---------+---------------+---------+-----------+----------+--------------+ LEFT     CompressibilityPhasicitySpontaneityPropertiesThrombus Aging +---------+---------------+---------+-----------+----------+--------------+ CFV      Full           Yes      Yes                                  +---------+---------------+---------+-----------+----------+--------------+ SFJ      Full                                                        +---------+---------------+---------+-----------+----------+--------------+ FV Prox  Full                                                        +---------+---------------+---------+-----------+----------+--------------+ FV Mid   Full           Yes      Yes                                 +---------+---------------+---------+-----------+----------+--------------+ FV Distal               Yes      Yes                                 +---------+---------------+---------+-----------+----------+--------------+ PFV      Full                                                        +---------+---------------+---------+-----------+----------+--------------+ POP      Full           Yes      Yes                                 +---------+---------------+---------+-----------+----------+--------------+ PTV                     Yes      Yes                                 +---------+---------------+---------+-----------+----------+--------------+ PERO                    Yes      Yes                                 +---------+---------------+---------+-----------+----------+--------------+  Summary: LEFT: - There is no evidence of deep vein thrombosis in the lower extremity. However, portions of this examination were limited- see technologist comments above.  - No cystic structure found in the popliteal fossa.  *See table(s) above for measurements and observations. Electronically signed by Heath Lark on 08/01/2021 at 4:56:55 PM.    Final      Scheduled Meds:  acetaminophen  1,000 mg Oral Q6H   amLODipine  10 mg Oral Daily   atorvastatin  10 mg Oral Daily   buPROPion  200 mg Oral BID   docusate sodium  100 mg Oral BID   DULoxetine  90 mg Oral Daily   empagliflozin  25 mg Oral Daily   enoxaparin  (LOVENOX) injection  40 mg Subcutaneous Q24H   hydrALAZINE  25 mg Oral TID   insulin aspart  0-9 Units Subcutaneous Q4H   lurasidone  40 mg Oral Daily   metoprolol tartrate  50 mg Oral BID   mometasone-formoterol  2 puff Inhalation BID   pantoprazole  40 mg Oral Daily   polyethylene glycol  17 g Oral Daily   pregabalin  225 mg Oral TID   QUEtiapine  100 mg Oral QHS   traZODone  200 mg Oral QHS   Continuous Infusions:  sodium chloride 50 mL/hr at 08/01/21 0350   clindamycin (CLEOCIN) IV 600 mg (08/02/21 1317)   methocarbamol (ROBAXIN) IV       LOS: 2 days   Time spent: 80  Rhetta Mura, MD [facility] To contact the attending provider between 7A-7P or the covering provider during after hours 7P-7A, please log into the web site [email or web address] and access using universal [place] password for that web site. If you do not have the password, please call the hospital operator.  08/02/2021, 1:50 PM

## 2021-08-02 NOTE — Progress Notes (Signed)
  Echocardiogram 2D Echocardiogram has been performed.  Roosvelt Maser F 08/02/2021, 3:05 PM

## 2021-08-02 NOTE — Evaluation (Signed)
Occupational Therapy Evaluation Patient Details Name: [name] MRN: [id number] DOB: [birth year] Today's Date: 08/02/2021   History of Present Illness 60 y.o. female with medical history significant of mild nonobstructive CAD per LHC done in August 2021, chronic diastolic CHF, morbid obesity (BMI 56.49, COPD, insulin-dependent type 2 diabetes, neuropathy, hypertension, anxiety, depression, hyperlipidemia, anemia.  Admitted in August 2021 and underwent surgery for left bimalleolar ankle fracture.  Admitted again in September 2022 for left ankle fusion and hardware removal surgery.  Patient had been non-compliant with her NWB at home, states she heard a "pop" and severe pain.  X-ray showed a periprosthetic fracture that had displaced.  Underwent an ORIF to left tibial shaft.  NWB.   Clinical Impression   Patient admitted for the above diagnosis and procedure.  Patient sustained a similar injury and was at [facility] at a local SNF.  She discharged home and [facility] services had just begun, when she re-injured the same leg.  Patient admits to noncompliance with WBS on her LLE at home, but states she had no choice.  Of note, she was not sent home with a BSC, which would have increased safety, as the RW did not fit through her bathroom door at home.  Patient states she was sleeping in a recliner in the living room.  Deficits impacting independence are listed below.  Currently she is needing up to Mod A for basic bed mobility to assist her L leg, with cues for WBS, and up to Max A for ADL completion.  OT will follow in the acute setting to increase her functional status, but given the level of assist needed, and no availability of assist at home, SNF for ST Rehab is recommended for continued WBS training, and compensatory strategies to ensure a safe transition home.         Recommendations for follow up therapy are one component of a multi-disciplinary discharge planning process, led by the attending physician.   Recommendations may be updated based on patient status, additional functional criteria and insurance authorization.   Follow Up Recommendations  Skilled nursing-short term rehab (<3 hours/day)    Assistance Recommended at Discharge Frequent or constant Supervision/Assistance  Functional Status Assessment  Patient has had a recent decline in their functional status and demonstrates the ability to make significant improvements in function in a reasonable and predictable amount of time.  Equipment Recommendations  [facility]    Recommendations for Other Services       Precautions / Restrictions Precautions Precautions: Fall Required Braces or Orthoses: Splint/Cast Splint/Cast: L lower leg wrapped ACE Restrictions Weight Bearing Restrictions: Yes LLE Weight Bearing: Non weight bearing Other Position/Activity Restrictions: Body habitus      Mobility Bed Mobility Overal bed mobility: Needs Assistance Bed Mobility: Sidelying to Sit;Sit to Sidelying   Sidelying to sit: Mod assist     Sit to sidelying: Mod assist General bed mobility comments: patient is pushing down into the bed with her LLE when sitting up, or sliding up higher in bed.    Transfers                   General transfer comment: Patient deferred      Balance Overall balance assessment: Needs assistance Sitting-balance support: Feet supported Sitting balance-Leahy Scale: Good                                     ADL  either performed or assessed with clinical judgement   ADL       Grooming: Wash/dry hands;Set up;Bed level;Wash/dry face       Lower Body Bathing: Maximal assistance;Bed level   Upper Body Dressing : Minimal assistance;Bed level   Lower Body Dressing: Maximal assistance;Bed level     Toilet Transfer Details (indicate cue type and reason): Bed level with the bedpan Toileting- Clothing Manipulation and Hygiene: Maximal assistance;Bed level               Vision  Baseline Vision/History: 1 Wears glasses Patient Visual Report: No change from baseline       Perception Perception Perception: Not tested   Praxis Praxis Praxis: Not tested    Pertinent Vitals/Pain Pain Assessment: No/denies pain     Hand Dominance Right   Extremity/Trunk Assessment Upper Extremity Assessment Upper Extremity Assessment: Overall WFL for tasks assessed   Lower Extremity Assessment Lower Extremity Assessment: Defer to PT evaluation   Cervical / Trunk Assessment Cervical / Trunk Assessment: Other exceptions Cervical / Trunk Exceptions: body habitus   Communication Communication Communication: No difficulties   Cognition Arousal/Alertness: Awake/alert Behavior During Therapy: WFL for tasks assessed/performed Overall Cognitive Status: Within Functional Limits for tasks assessed                                                        Home Living Family/patient expects to be discharged to:: Private residence Living Arrangements: Alone Available Help at Discharge: Family;Friend(s);Available PRN/intermittently Type of Home: Mobile home Home Access: Stairs to enter Entrance Stairs-Number of Steps: 5 Entrance Stairs-Rails: Left Home Layout: One level     Bathroom Shower/Tub: Chief Strategy Officer: Standard Bathroom Accessibility: Yes How Accessible: Other (comment) (SPC) Home Equipment: Rolling Walker (2 wheels);Shower seat;Hand held shower head;Adaptive equipment;Wheelchair - Equities trader: Reacher        Prior Functioning/Environment Prior Level of Function : Needs assist       Physical Assist : Mobility (physical);ADLs (physical) Mobility (physical): Transfers;Gait;Stairs ADLs (physical): Bathing;Dressing;Toileting;IADLs Mobility Comments: Patient has been placing weight through LLE ADLs Comments: States she was able to bathe and dress, but had to place weight on her LLE.  Family assisted with  community mobility and groceries.        OT Problem List: Decreased strength;Decreased activity tolerance;Impaired balance (sitting and/or standing);Decreased knowledge of precautions;Decreased knowledge of use of DME or AE;Decreased safety awareness;Pain;Increased edema      OT Treatment/Interventions: Self-care/ADL training;Therapeutic activities;DME and/or AE instruction;Patient/family education;Balance training    OT Goals(Current goals can be found in the care plan section) Acute Rehab OT Goals Patient Stated Goal: Finish rehab and move better OT Goal Formulation: With patient Time For Goal Achievement: 08/16/21 Potential to Achieve Goals: Good ADL Goals Pt Will Perform Grooming: with supervision;sitting Pt Will Perform Upper Body Bathing: with supervision;sitting Pt Will Perform Upper Body Dressing: with supervision;sitting Pt Will Perform Lower Body Dressing: with min assist;sitting/lateral leans;with adaptive equipment Pt Will Transfer to Toilet: stand pivot transfer;bedside commode;with min assist Pt Will Perform Toileting - Clothing Manipulation and hygiene: with mod assist;sit to/from stand  OT Frequency: Min 2X/week   Barriers to D/C: Decreased caregiver support          Co-evaluation              AM-PAC OT "  6 Clicks" Daily Activity     Outcome Measure Help from another person eating meals?: None Help from another person taking care of personal grooming?: None Help from another person toileting, which includes using toliet, bedpan, or urinal?: A Lot Help from another person bathing (including washing, rinsing, drying)?: A Lot Help from another person to put on and taking off regular upper body clothing?: A Little Help from another person to put on and taking off regular lower body clothing?: A Lot 6 Click Score: 17   End of Session Nurse Communication: Mobility status  Activity Tolerance: Patient tolerated treatment well Patient left: in bed;with call  bell/phone within reach  OT Visit Diagnosis: Unsteadiness on feet (R26.81);Muscle weakness (generalized) (M62.81);Pain Pain - Right/Left: Left Pain - part of body: Ankle and joints of foot                Time: 0945-1003 OT Time Calculation (min): 18 min Charges:  OT General Charges $OT Visit: 1 Visit OT Evaluation $OT Eval Moderate Complexity: 1 Mod  08/02/2021  RP, OTR/L  Acute Rehabilitation Services  Office:  [phone]   Suzanna Obey 08/02/2021, 10:20 AM

## 2021-08-02 NOTE — Evaluation (Signed)
Physical Therapy Evaluation Patient Details Name: [name] MRN: [id number] DOB: [birth year] Today's Date: 08/02/2021  History of Present Illness  The pt is a 60 yo female presenting 10/27 with L ankle pain after standing up on it last night and hearing a "crack". Of note, pt with L ankle fusion in sep 2022, re-fx on 10/18, and was d/c home where she was unable to maintain NWB status. Now s/p ORIF L tibial shaft 1028.  PMH includes: morbid obesity, CAD, CHF, DM II, HTN, MI, sleep apnea, COPD, CKD anxiety, depression, HLD, and anemia.   Clinical Impression  Pt in bed upon arrival of PT, agreeable to evaluation at this time. Prior to admission the pt was living in a home with 5 steps to enter, relying primarily on manual WC for movement in the home, but dependent on walking on LLE in her bathroom as no DME fits through the doors of her house. The pt reports she is aware of her LLE NWB orders, but was unable to maintain due to her weight. The pt now presents with limitations in functional mobility, strength, stability, power, and continued difficulties maintaining NWB LLE due to above dx, and will continue to benefit from skilled PT to address these deficits. The pt was able to complete sit-stand transfer with maxA and use of RW with TDWB LLE, but was only able to maintain for 1-2 sec due to fatigue in RLE. She also requires modA to complete lateral scot transfers at this time to maintain NWB LLE. Given repeated re-admissions for continued fx of LLE due to breaking NWB in the past, recommend d/c to skilled rehab to improve pt strength and power in RLE as well as endurance to maintain standing and complete transfers without breaking wb status.         Recommendations for follow up therapy are one component of a multi-disciplinary discharge planning process, led by the attending physician.  Recommendations may be updated based on patient status, additional functional criteria and insurance  authorization.  Follow Up Recommendations Skilled nursing-short term rehab (<3 hours/day)    Assistance Recommended at Discharge Frequent or constant Supervision/Assistance  Functional Status Assessment Patient has had a recent decline in their functional status and demonstrates the ability to make significant improvements in function in a reasonable and predictable amount of time.   Equipment Recommendations  BSC (bariatric BSC) - the pt is well equipped, but does not have optimal space in her home to allow for functional use of DME   Recommendations for Other Services       Precautions / Restrictions Precautions Precautions: Fall Precaution Comments: repeated fx of L ankle due to inability to maintain NWB Required Braces or Orthoses: Splint/Cast Splint/Cast: L lower leg wrapped ACE Restrictions Weight Bearing Restrictions: Yes LLE Weight Bearing: Non weight bearing      Mobility  Bed Mobility Overal bed mobility: Needs Assistance Bed Mobility: Sidelying to Sit;Sit to Sidelying   Sidelying to sit: Min assist     Sit to sidelying: Min assist General bed mobility comments: minA to complete movement with trunk, max cues and assist to keep pt from pushing through LLE to complete bed mobility    Transfers Overall transfer level: Needs assistance Equipment used: Rolling walker (2 wheels) Transfers: Sit to/from Stand;Lateral/Scoot Transfers Sit to Stand: Max assist;From elevated surface          Lateral/Scoot Transfers: Mod assist General transfer comment: maxA to complete with max encouragement not to use LLE. cues for placement  of UE, positioning of LLE, and maxA to steady while pt moved UE to RW support. only able to maintain for 1-2 sec due to fatigue. modA to manage NWB LLE with lateral scoot    Ambulation/Gait             General Gait Details: pt unable to maintain NWB for stepping     Balance Overall balance assessment: Needs assistance Sitting-balance  support: Feet supported Sitting balance-Leahy Scale: Fair Sitting balance - Comments: pt unable to lean outside BOS   Standing balance support: Bilateral upper extremity supported;Reliant on assistive device for balance Standing balance-Leahy Scale: Poor Standing balance comment: BUE support and maxA                             Pertinent Vitals/Pain Pain Assessment: No/denies pain    Home Living Family/patient expects to be discharged to:: Private residence Living Arrangements: Alone Available Help at Discharge: Family;Friend(s);Available PRN/intermittently Type of Home: Mobile home Home Access: Stairs to enter Entrance Stairs-Rails: Left Entrance Stairs-Number of Steps: 5   Home Layout: One level Home Equipment: Agricultural consultant (2 wheels);Shower seat;Hand held shower head;Adaptive equipment;Wheelchair - manual Additional Comments: pt has a manual WC but is unable to maneuver in the home as there is no space for it to fit in hallways/around furniture. pt states she does not have BSC but if she got a BSC she would have no place for her WC    Prior Function Prior Level of Function : Needs assist       Physical Assist : Mobility (physical);ADLs (physical) Mobility (physical): Transfers;Gait;Stairs ADLs (physical): Bathing;Dressing;Toileting;IADLs Mobility Comments: pt has been unable to complete any OOB mobility without placing wt through LLE. using WC from recliner to bathroom door then walking on her LLE to bathroom ADLs Comments: States she was able to bathe and dress, but had to place weight on her LLE.  Family assisted with community mobility and groceries.     Hand Dominance   Dominant Hand: Right    Extremity/Trunk Assessment   Upper Extremity Assessment Upper Extremity Assessment: Defer to OT evaluation    Lower Extremity Assessment Lower Extremity Assessment: Generalized weakness;LLE deficits/detail;RLE deficits/detail RLE Deficits / Details: pt able  to move against gravity, grossly 4/5. at times limited by soft tissue. pt reports increasing pain in R knee due to increased demand after LLE injury RLE Sensation: WNL RLE Coordination: WNL LLE Deficits / Details: pt able to move against gravity, but not maintain. limited by immobilization LLE: Unable to fully assess due to immobilization LLE Sensation: WNL LLE Coordination: WNL    Cervical / Trunk Assessment Cervical / Trunk Assessment: Other exceptions Cervical / Trunk Exceptions: body habitus  Communication   Communication: No difficulties  Cognition Arousal/Alertness: Awake/alert Behavior During Therapy: WFL for tasks assessed/performed Overall Cognitive Status: Within Functional Limits for tasks assessed                                 General Comments: pt able to follow all cues and commands, needs repeated encouragement to attempt mobility without use of LLE as she states she is unable to complete this at home.        General Comments General comments (skin integrity, edema, etc.): VSS on 3L O2    Exercises     Assessment/Plan    PT Assessment Patient needs continued PT services  PT  Problem List Decreased range of motion;Decreased activity tolerance;Decreased strength;Decreased balance;Decreased mobility;Decreased knowledge of use of DME;Decreased safety awareness;Pain;Decreased knowledge of precautions       PT Treatment Interventions Gait training;DME instruction;Stair training;Functional mobility training;Therapeutic activities;Therapeutic exercise;Balance training;Patient/family education    PT Goals (Current goals can be found in the Care Plan section)  Acute Rehab PT Goals Patient Stated Goal: to heal her foot PT Goal Formulation: With patient Time For Goal Achievement: 08/16/21 Potential to Achieve Goals: Poor    Frequency Min 3X/week   Barriers to discharge Decreased caregiver support;Inaccessible home environment pt with 5 steps to enter  and lives alone       AM-PAC PT "6 Clicks" Mobility  Outcome Measure Help needed turning from your back to your side while in a flat bed without using bedrails?: A Little Help needed moving from lying on your back to sitting on the side of a flat bed without using bedrails?: A Little Help needed moving to and from a bed to a chair (including a wheelchair)?: Total Help needed standing up from a chair using your arms (e.g., wheelchair or bedside chair)?: Total Help needed to walk in hospital room?: Total Help needed climbing 3-5 steps with a railing? : Total 6 Click Score: 10    End of Session Equipment Utilized During Treatment: Gait belt;Oxygen Activity Tolerance: Patient limited by fatigue;Patient limited by pain Patient left: in bed;with call bell/phone within reach;with bed alarm set Nurse Communication: Mobility status PT Visit Diagnosis: Unsteadiness on feet (R26.81);Other abnormalities of gait and mobility (R26.89);Muscle weakness (generalized) (M62.81);Pain Pain - Right/Left: Left Pain - part of body: Ankle and joints of foot;Leg    Time: 7672-0947 PT Time Calculation (min) (ACUTE ONLY): 26 min   Charges:   PT Evaluation $PT Eval Moderate Complexity: 1 Mod PT Treatments $Therapeutic Activity: 8-22 mins        Vickki Muff, PT, DPT   Acute Rehabilitation Department Pager #: [phone]  Ronnie Derby 08/02/2021, 4:22 PM

## 2021-08-02 NOTE — Progress Notes (Signed)
Orthopaedic Trauma Progress Note  SUBJECTIVE: Patient sleeping soundly this morning. No acute events. No new complsints  OBJECTIVE:  Vitals:   08/01/21 2342 08/02/21 0354  BP: (!) 115/52 (!) 122/54  Pulse: 84 81  Resp: 12 11  Temp: 98.4 F (36.9 C) 98.4 F (36.9 C)  SpO2: 94% 90%    General: Sleeping in bed comfortably, no acute distress Respiratory: No increased work of breathing.  Left lower extremity: Dressing clean, dry, intact.  Significant neuropathy at baseline.   Foot warm and well-perfused.  IMAGING: Stable post op imaging.   LABS:  Results for orders placed or performed during the hospital encounter of 07/31/21 (from the past 24 hour(s))  CBG monitoring, ED     Status: Abnormal   Collection Time: 08/01/21  9:06 AM  Result Value Ref Range   Glucose-Capillary 67 (L) 70 - 99 mg/dL  CBG monitoring, ED     Status: Abnormal   Collection Time: 08/01/21  9:37 AM  Result Value Ref Range   Glucose-Capillary 130 (H) 70 - 99 mg/dL  Glucose, capillary     Status: Abnormal   Collection Time: 08/01/21 12:22 PM  Result Value Ref Range   Glucose-Capillary 103 (H) 70 - 99 mg/dL   Comment 1 Notify RN    Comment 2 Document in Chart   Glucose, capillary     Status: Abnormal   Collection Time: 08/01/21  3:50 PM  Result Value Ref Range   Glucose-Capillary 148 (H) 70 - 99 mg/dL  Glucose, capillary     Status: Abnormal   Collection Time: 08/01/21  7:49 PM  Result Value Ref Range   Glucose-Capillary 244 (H) 70 - 99 mg/dL  Glucose, capillary     Status: Abnormal   Collection Time: 08/01/21 11:46 PM  Result Value Ref Range   Glucose-Capillary 223 (H) 70 - 99 mg/dL  Basic metabolic panel     Status: Abnormal   Collection Time: 08/02/21 12:15 AM  Result Value Ref Range   Sodium 135 135 - 145 mmol/L   Potassium 4.9 3.5 - 5.1 mmol/L   Chloride 101 98 - 111 mmol/L   CO2 26 22 - 32 mmol/L   Glucose, Bld 227 (H) 70 - 99 mg/dL   BUN 17 6 - 20 mg/dL   Creatinine, Ser 3.53 (H) 0.44 -  1.00 mg/dL   Calcium 8.6 (L) 8.9 - 10.3 mg/dL   GFR, Estimated 49 (L) >60 mL/min   Anion gap 8 5 - 15  CBC     Status: Abnormal   Collection Time: 08/02/21 12:15 AM  Result Value Ref Range   WBC 9.5 4.0 - 10.5 K/uL   RBC 3.93 3.87 - 5.11 MIL/uL   Hemoglobin 10.9 (L) 12.0 - 15.0 g/dL   HCT 61.4 (L) 43.1 - 54.0 %   MCV 90.8 80.0 - 100.0 fL   MCH 27.7 26.0 - 34.0 pg   MCHC 30.5 30.0 - 36.0 g/dL   RDW 08.6 76.1 - 95.0 %   Platelets 243 150 - 400 K/uL   nRBC 0.0 0.0 - 0.2 %  Glucose, capillary     Status: Abnormal   Collection Time: 08/02/21  3:49 AM  Result Value Ref Range   Glucose-Capillary 232 (H) 70 - 99 mg/dL    ASSESSMENT: [name] is a 60 y.o. female, 1 Day Post-Op s/p ORIF PERIPROSTHETIC TIBIA FRACTURE  CV/Blood loss: Acute blood loss anemia, Hgb 10.9 this morning. Hemodynamically stable  PLAN: Weightbearing: NWB LLE Incisional and  dressing care: Reinforce dressings as needed  Showering: ok to shower, keep dressing dry Orthopedic device(s): None  Pain management:  1. Tylenol 1000 mg q 6 hours scheduled 2. Robaxin 750 mg q 6 hours PRN 3. Oxycodone 10-15 mg q 4 hours PRN 4. Lyrica 225 mg TID 5. Dilaudid 0.5-1 mg q 4 hours PRN 6. Toradol 15 mg q 6 hours x 4 doses VTE prophylaxis: Lovenox, SCDs ID:  Ancef 2gm post op Foley/Lines:  No foley, KVO IVFs Impediments to Fracture Healing: diabetes Dispo: PT/OT eval today, will likely require SNF.  Plan to change dressing tomorrow.  Okay for discharge from ortho standpoint once cleared by medicine team and therapies Follow - up plan: 2 weeks after discharge for repeat x-rays and wound check  Contact information:  Truitt Merle MD, Ulyses Southward PA-C. After hours and holidays please check [email or web address] for group call information for [facility]   Myrtle Haller A. Michaelyn Barter, PA-C [phone] (office) [email or web address]

## 2021-08-03 LAB — BASIC METABOLIC PANEL
Anion gap: 8 (ref 5–15)
BUN: 20 mg/dL (ref 6–20)
CO2: 24 mmol/L (ref 22–32)
Calcium: 8.5 mg/dL — ABNORMAL LOW (ref 8.9–10.3)
Chloride: 105 mmol/L (ref 98–111)
Creatinine, Ser: 1.23 mg/dL — ABNORMAL HIGH (ref 0.44–1.00)
GFR, Estimated: 51 mL/min — ABNORMAL LOW (ref >60)
Glucose, Bld: 220 mg/dL — ABNORMAL HIGH (ref 70–99)
Potassium: 4.6 mmol/L (ref 3.5–5.1)
Sodium: 137 mmol/L (ref 135–145)

## 2021-08-03 LAB — GLUCOSE, CAPILLARY
Glucose-Capillary: 140 mg/dL — ABNORMAL HIGH (ref 70–99)
Glucose-Capillary: 146 mg/dL — ABNORMAL HIGH (ref 70–99)
Glucose-Capillary: 169 mg/dL — ABNORMAL HIGH (ref 70–99)
Glucose-Capillary: 201 mg/dL — ABNORMAL HIGH (ref 70–99)
Glucose-Capillary: 218 mg/dL — ABNORMAL HIGH (ref 70–99)
Glucose-Capillary: 246 mg/dL — ABNORMAL HIGH (ref 70–99)

## 2021-08-03 LAB — CBC
HCT: 33.2 % — ABNORMAL LOW (ref 36.0–46.0)
Hemoglobin: 10 g/dL — ABNORMAL LOW (ref 12.0–15.0)
MCH: 27.9 pg (ref 26.0–34.0)
MCHC: 30.1 g/dL (ref 30.0–36.0)
MCV: 92.7 fL (ref 80.0–100.0)
Platelets: 234 10*3/uL (ref 150–400)
RBC: 3.58 MIL/uL — ABNORMAL LOW (ref 3.87–5.11)
RDW: 14.7 % (ref 11.5–15.5)
WBC: 8.8 10*3/uL (ref 4.0–10.5)
nRBC: 0 % (ref 0.0–0.2)

## 2021-08-03 MED ORDER — HYDROMORPHONE HCL 1 MG/ML IJ SOLN
1.0000 mg | INTRAMUSCULAR | Status: DC | PRN
  Administered 2021-08-03 – 2021-08-04 (×5): 2 mg via INTRAVENOUS
  Filled 2021-08-03 (×5): qty 2

## 2021-08-03 MED ORDER — OXYCODONE HCL 5 MG PO TABS
20.0000 mg | ORAL_TABLET | ORAL | Status: DC | PRN
  Administered 2021-08-03 – 2021-08-04 (×3): 20 mg via ORAL
  Filled 2021-08-03 (×3): qty 4

## 2021-08-03 MED ORDER — KETOROLAC TROMETHAMINE 15 MG/ML IJ SOLN
15.0000 mg | Freq: Four times a day (QID) | INTRAMUSCULAR | Status: DC
  Administered 2021-08-03 – 2021-08-04 (×5): 15 mg via INTRAVENOUS
  Filled 2021-08-03 (×5): qty 1

## 2021-08-03 MED ORDER — AMOXICILLIN-POT CLAVULANATE 875-125 MG PO TABS
1.0000 | ORAL_TABLET | Freq: Two times a day (BID) | ORAL | Status: DC
  Administered 2021-08-03 – 2021-08-08 (×11): 1 via ORAL
  Filled 2021-08-03 (×11): qty 1

## 2021-08-03 NOTE — TOC Initial Note (Signed)
Transition of Care [facility]) - Initial/Assessment Note    Patient Details  Name: [name] MRN: [id number] Date of Birth: 04-Jan-1961  [facility] CM/SW Contact:    Bary Castilla, LCSW Phone Number:[phone] 08/03/2021, 1:50 PM  Clinical Narrative:                  CSW met with patient to discuss PT recommendation of a SNF. Patient was aware of recommendation and in agreement with going to a ST SNF. CSW discussed the SNF process.CSW provided patient with medicare.gov rating list.  Patient gave CSW permission to fax referrals out to local facilities. Pt prefers [facility] or [place] facilities. CSW answered questions about the SNF process and the next steps in the process.  Pt stressed that she would need a bariatric bed at the facility. Pt has been to [facility] and [facility] and stated she could return to either one as well.  Pt stated that she has been vaccinated and received one booster.  TOC team will continue to assist with discharge planning needs.     Expected Discharge Plan: Skilled Nursing Facility Barriers to Discharge: SNF Pending bed offer, Continued Medical Work up, Ship broker   Patient Goals and CMS Choice Patient states their goals for this hospitalization and ongoing recovery are:: To be able to return home CMS Medicare.gov Compare Post Acute Care list provided to:: Patient Choice offered to / list presented to : Patient  Expected Discharge Plan and Services Expected Discharge Plan: [place]       Living arrangements for the past 2 months: Single Family Home                                      Prior Living Arrangements/Services Living arrangements for the past 2 months: Single Family Home Lives with:: Self Patient language and need for interpreter reviewed:: Yes          Care giver support system in place?: Yes (comment)      Activities of Daily Living Home Assistive  Devices/Equipment: Gilford Rile (specify type) ADL Screening (condition at time of admission) Patient's cognitive ability adequate to safely complete daily activities?: Yes Is the patient deaf or have difficulty hearing?: No Does the patient have difficulty seeing, even when wearing glasses/contacts?: No Does the patient have difficulty concentrating, remembering, or making decisions?: No Patient able to express need for assistance with ADLs?: Yes Does the patient have difficulty dressing or bathing?: No Independently performs ADLs?: Yes (appropriate for developmental age) Does the patient have difficulty walking or climbing stairs?: Yes Weakness of Legs: Left Weakness of Arms/Hands: None  Permission Sought/Granted   Permission granted to share information with : Yes, Verbal Permission Granted  Share Information with NAME: Jarrett Soho  Permission granted to share info w AGENCY: SNFs  Permission granted to share info w Relationship: Daughter  Permission granted to share info w Contact Information: [phone]  Emotional Assessment Appearance:: Appears stated age Attitude/Demeanor/Rapport: Engaged Affect (typically observed): Accepting, Adaptable Orientation: : Oriented to Self, Oriented to Place, Oriented to  Time, Oriented to Situation      Admission diagnosis:  Cough [R05.9] Fracture [T14.8XXA] Ankle fracture [S82.899A] Cellulitis of left lower extremity [L03.116] Closed fracture of shaft of right tibia, unspecified fracture morphology, initial encounter [S82.201A] Patient Active Problem List   Diagnosis Date Noted   Ankle fracture 07/31/2021   Cellulitis  07/31/2021   Arthritis of left ankle 06/11/2021   Closed bimalleolar fracture of left ankle 05/06/2021   Closed left ankle fracture 05/28/2020   Moderate protein malnutrition (HCC)    Adult BMI 50.0-59.9 kg/sq m (HCC)    Normocytic anemia 04/21/2018   Diabetic foot infection ([place]) 04/21/2018   Mild renal insufficiency 04/21/2018    Diabetic infection of right foot [facility])    Preventative health care 94/32/0037   DYSMETABOLIC SYNDROME 94/44/6190   ULNAR NEUROPATHY, BILATERAL 10/01/2010   Insulin-requiring or dependent type II diabetes mellitus ([place]) 09/11/2010   HYPERLIPIDEMIA 09/11/2010   Morbid (severe) obesity due to excess calories ([place]) 09/11/2010   TOBACCO USE 09/11/2010   Depression with anxiety 09/11/2010   Essential hypertension 09/11/2010   GERD 09/11/2010   PEPTIC ULCER DISEASE 09/11/2010   COLONIC POLYPS, HX OF 09/11/2010   Lumbosacral spondylosis 02/27/2010   PCP:  Helen Hashimoto., MD Pharmacy:   [facility] [place], [address] [place] [address] Phone: [phone] Fax: [phone]  [id number] [place], [address] [place] [facility] [place] [address] Phone: [phone] Fax: [phone]     Social Determinants of Health (SDOH) Interventions    Readmission Risk Interventions No flowsheet data found.

## 2021-08-03 NOTE — Progress Notes (Signed)
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 05/17/2020, COPD not on oxygen, HF PEF, IDDM + neuropathy Prior hospitalization 812-05/26/2021 right great toe osteomyelitis has non-STEMI pulmonary edema--supposed to be treated for 4 weeks with IV ceftriaxone Admitted 8/24 through 8/31 with bimalleolar fracture, ankle with dislocation underwent ORIF 8/25 current daily smoker Patient developed hardware failure and Charcot arthropathy with tibial talar joint and conservative measures were not successful--readmitted 9/7 through 06/25/2021 and underwent fusion left tibiotalar joint and left subtalar joint and fusion; discharged to SNF In the ED on 10/18 feeling a pop on her left ankle-was sent home from the ED--was told to follow-up with Dr. Josiah Lobo she lives alone she has had to ambulate and has been bearing weight on  Admitted for need for ORIF of left ankle Found to have cough chest pain but work-up negative  Hospital-Problem based course  Acute fracture distal tibia at proximal IM nail S/p ORIF left ankle Dr. Loney Loh, Therapy assessment recs SNF---Abx per ortho Pain not controlled today--tells me was on oxy 20 recently--Meds adjusted and increased Increased dilaudid from 0.5-->1-2 q4 prn Continuing toradol 15 q6, If no better, suggest wound be visualized by ortho in am Current smoker COPD not on oxygen Continue Symbicort 2 puffs twice daily as needed, mometasone 2 puffs twice daily Nonobstructive CAD on cath 8/13, HFpEF Continue metoprolol 50 twice daily, hydralazine 25 3 times daily, amlodipine 10 Add back Jardiance 25 daily Can saline lock 10/29--periodic labs IDDM on insulin with neuropathy resumed Jardiance Cbg 180-232, adding back lantus at lower dose 12 bid [home =60 d] Hold ozempic Continue Lyrica to 25 3  times daily Superobesity BMI 56 Will need outpatient discussion about treatment and about weight loss Reports of chest pain, cough EKG reviewed--history not concerning for angina-states pain is when the patient coughs-troponin is negative and EKG shows no T wave inversions-monitor Depression Continue bupropion 200 twice daily Cymbalta 90 daily lurasidone 40 daily Seroquel 100 at bedtime and trazodone 200 at bedtime-May need to minimize this polypharmacy in the outpatient setting  DVT prophylaxis: Lovenox Code Status: Full Family Communication: Patient alone Disposition:  Status is: Inpatient  Remains inpatient appropriate because: Needs Skilled placement--probably will happen monday      Consultants:  Orthopedics Dr. Jena Gauss  Procedures: None yet  Antimicrobials: Clindamycin from admission   Subjective:  Pain worse today--` 8/10 at rest Was able to weight bear prior to admit Toradol increased by ortho No cp no other issue  Objective: Vitals:   08/03/21 0724 08/03/21 0745 08/03/21 0900 08/03/21 1116  BP: (!) 128/55   (!) 119/55  Pulse: 81  78 74  Resp: 20  18 16   Temp: 98.1 F (36.7 C)   98.8 F (37.1 C)  TempSrc: Oral   Oral  SpO2: 95% 96% 95% 94%  Weight:      Height:        Intake/Output Summary (Last 24 hours) at 08/03/2021 1529 Last data filed at 08/03/2021 1200 Gross per 24 hour  Intake 770 ml  Output 3100 ml  Net -2330 ml    Filed Weights   07/31/21 1622  Weight: (!) 158.8 kg    Examination:  no distress thick neck  S1-S2 no murmur, RRR on monitors Chest exam relatively clear Abdomen is very obese --poor exam Right lower extremity slightly swollen  left left lower  extremity wrapped in Coban   Data Reviewed: personally reviewed   CBC    Component Value Date/Time   WBC 8.8 08/03/2021 0129   RBC 3.58 (L) 08/03/2021 0129   HGB 10.0 (L) 08/03/2021 0129   HCT 33.2 (L) 08/03/2021 0129   PLT 234 08/03/2021 0129   MCV 92.7 08/03/2021 0129    MCH 27.9 08/03/2021 0129   MCHC 30.1 08/03/2021 0129   RDW 14.7 08/03/2021 0129   LYMPHSABS 2.1 07/31/2021 1754   MONOABS 1.0 07/31/2021 1754   EOSABS 0.4 07/31/2021 1754   BASOSABS 0.1 07/31/2021 1754   CMP Latest Ref Rng & Units 08/03/2021 08/02/2021 07/31/2021  Glucose 70 - 99 mg/dL 031(R) 945(O) 83  BUN 6 - 20 mg/dL 20 17 12   Creatinine 0.44 - 1.00 mg/dL 5.92(T) 2.44(Q) 2.86(N)  Sodium 135 - 145 mmol/L 137 135 133(L)  Potassium 3.5 - 5.1 mmol/L 4.6 4.9 4.3  Chloride 98 - 111 mmol/L 105 101 98  CO2 22 - 32 mmol/L 24 26 24   Calcium 8.9 - 10.3 mg/dL 8.1(R) 7.1(H) 6.5(B)  Total Protein 6.0 - 8.3 g/dL - - -  Total Bilirubin 0.3 - 1.2 mg/dL - - -  Alkaline Phos 39 - 117 U/L - - -  AST 0 - 37 U/L - - -  ALT 0 - 35 U/L - - -     Radiology Studies: ECHOCARDIOGRAM COMPLETE  Result Date: 08/02/2021    ECHOCARDIOGRAM REPORT   Patient Name:   [name] Date of Exam: 08/02/2021 Medical Rec #:  [id number]     Height:       66.0 in Accession #:    [id number]    Weight:       350.0 lb Date of Birth:  1961-04-03    BSA:          2.537 m Patient Age:    59 years      BP:           122/55 mmHg Patient Gender: F             HR:           72 bpm. Exam Location:  Inpatient Procedure: 2D Echo, Cardiac Doppler and Color Doppler Indications:     Chest pain  History:         Patient has no prior history of Echocardiogram examinations.                  Signs/Symptoms:Shortness of Breath; Risk Factors:Morbid                  obesity.  Sonographer:     Roosvelt Maser RDCS Referring Phys:  [id number] John Giovanni Diagnosing Phys: Orpah Cobb MD IMPRESSIONS  1. Left ventricular ejection fraction, by estimation, is 60 to 65%. The left ventricle has normal function. The left ventricle has no regional wall motion abnormalities. There is mild concentric left ventricular hypertrophy. Left ventricular diastolic parameters are consistent with Grade II diastolic dysfunction (pseudonormalization).  2. Right  ventricular systolic function is normal. The right ventricular size is normal. There is normal pulmonary artery systolic pressure.  3. Left atrial size was moderately dilated.  4. The mitral valve is normal in structure. Mild mitral valve regurgitation.  5. The aortic valve is tricuspid. Aortic valve regurgitation is not visualized.  6. The inferior vena cava is dilated in size with <50% respiratory variability, suggesting right atrial pressure of 15 mmHg. FINDINGS  Left Ventricle: Left ventricular ejection fraction,  by estimation, is 60 to 65%. The left ventricle has normal function. The left ventricle has no regional wall motion abnormalities. The left ventricular internal cavity size was normal in size. There is  mild concentric left ventricular hypertrophy. Left ventricular diastolic parameters are consistent with Grade II diastolic dysfunction (pseudonormalization). Right Ventricle: The right ventricular size is normal. No increase in right ventricular wall thickness. Right ventricular systolic function is normal. There is normal pulmonary artery systolic pressure. The tricuspid regurgitant velocity is 1.83 m/s, and  with an assumed right atrial pressure of 8 mmHg, the estimated right ventricular systolic pressure is 21.4 mmHg. Left Atrium: Left atrial size was moderately dilated. Right Atrium: Right atrial size was normal in size. Pericardium: There is no evidence of pericardial effusion. Mitral Valve: The mitral valve is normal in structure. Mild mitral valve regurgitation. Tricuspid Valve: The tricuspid valve is normal in structure. Tricuspid valve regurgitation is mild. Aortic Valve: The aortic valve is tricuspid. Aortic valve regurgitation is not visualized. Aortic valve mean gradient measures 5.0 mmHg. Aortic valve peak gradient measures 9.6 mmHg. Aortic valve area, by VTI measures 2.22 cm. Pulmonic Valve: The pulmonic valve was normal in structure. Pulmonic valve regurgitation is not visualized. Aorta:  The aortic root is normal in size and structure. There is minimal (Grade I) atheroma plaque involving the aortic root and ascending aorta. Venous: The inferior vena cava is dilated in size with less than 50% respiratory variability, suggesting right atrial pressure of 15 mmHg. IAS/Shunts: The atrial septum is grossly normal.  LEFT VENTRICLE PLAX 2D LVIDd:         5.20 cm   Diastology LVIDs:         3.20 cm   LV e' medial:    6.45 cm/s LV PW:         1.20 cm   LV E/e' medial:  17.5 LV IVS:        1.20 cm   LV e' lateral:   10.60 cm/s LVOT diam:     1.90 cm   LV E/e' lateral: 10.7 LV SV:         64 LV SV Index:   25 LVOT Area:     2.84 cm  RIGHT VENTRICLE          IVC RV Basal diam:  3.40 cm  IVC diam: 2.20 cm LEFT ATRIUM             Index        RIGHT ATRIUM           Index LA diam:        4.30 cm 1.70 cm/m   RA Area:     21.10 cm LA Vol (A2C):   91.0 ml 35.87 ml/m  RA Volume:   63.50 ml  25.03 ml/m LA Vol (A4C):   75.4 ml 29.72 ml/m LA Biplane Vol: 83.5 ml 32.92 ml/m  AORTIC VALVE AV Area (Vmax):    2.21 cm AV Area (Vmean):   2.32 cm AV Area (VTI):     2.22 cm AV Vmax:           155.00 cm/s AV Vmean:          102.000 cm/s AV VTI:            0.289 m AV Peak Grad:      9.6 mmHg AV Mean Grad:      5.0 mmHg LVOT Vmax:         121.00 cm/s LVOT Vmean:  83.300 cm/s LVOT VTI:          0.226 m LVOT/AV VTI ratio: 0.78  AORTA Ao Root diam: 3.10 cm Ao Asc diam:  2.80 cm MITRAL VALVE                TRICUSPID VALVE MV Area (PHT): 4.29 cm     TR Peak grad:   13.4 mmHg MV Decel Time: 177 msec     TR Vmax:        183.00 cm/s MV E velocity: 113.00 cm/s MV A velocity: 70.40 cm/s   SHUNTS MV E/A ratio:  1.61         Systemic VTI:  0.23 m                             Systemic Diam: 1.90 cm Orpah Cobb MD Electronically signed by Orpah Cobb MD Signature Date/Time: 08/02/2021/3:53:59 PM    Final      Scheduled Meds:  acetaminophen  1,000 mg Oral Q6H   amLODipine  10 mg Oral Daily   atorvastatin  10 mg Oral Daily    buPROPion  200 mg Oral BID   docusate sodium  100 mg Oral BID   DULoxetine  90 mg Oral Daily   empagliflozin  25 mg Oral Daily   enoxaparin (LOVENOX) injection  40 mg Subcutaneous Q24H   hydrALAZINE  25 mg Oral TID   insulin aspart  0-9 Units Subcutaneous Q4H   insulin glargine-yfgn  12 Units Subcutaneous BID   ketorolac  15 mg Intravenous Q6H   lurasidone  40 mg Oral Daily   metoprolol tartrate  50 mg Oral BID   mometasone-formoterol  2 puff Inhalation BID   pantoprazole  40 mg Oral Daily   polyethylene glycol  17 g Oral Daily   pregabalin  225 mg Oral TID   QUEtiapine  100 mg Oral QHS   traZODone  200 mg Oral QHS   Continuous Infusions:  clindamycin (CLEOCIN) IV 600 mg (08/03/21 1335)   methocarbamol (ROBAXIN) IV       LOS: 3 days   Time spent: 69  Rhetta Mura, MD [facility] To contact the attending provider between 7A-7P or the covering provider during after hours 7P-7A, please log into the web site [email or web address] and access using universal [place] password for that web site. If you do not have the password, please call the hospital operator.  08/03/2021, 3:29 PM

## 2021-08-03 NOTE — NC FL2 (Signed)
[place] MEDICAID FL2 LEVEL OF CARE SCREENING TOOL     IDENTIFICATION  Patient Name: [name] Birthdate: [birth year] Sex: female Admission Date (Current Location): 07/31/2021  [facility] and [place] Number:  Producer, television/film/video and Address:  [facility], 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   Closed bimalleolar fracture of left ankle 05/06/2021   Closed left ankle fracture 05/28/2020   Moderate protein malnutrition (HCC)    Adult BMI 50.0-59.9 kg/sq m (HCC)    Normocytic anemia 04/21/2018   Diabetic foot infection (HCC) 04/21/2018   Mild renal insufficiency 04/21/2018   Diabetic infection of right foot (HCC)    Preventative health care 04/14/2011   DYSMETABOLIC SYNDROME 12/08/2010   ULNAR NEUROPATHY, BILATERAL 10/01/2010   Insulin-requiring or dependent type II diabetes mellitus (HCC) 09/11/2010   HYPERLIPIDEMIA 09/11/2010   Morbid (severe) obesity due to excess calories (HCC) 09/11/2010   TOBACCO USE 09/11/2010   Depression with anxiety 09/11/2010   Essential hypertension 09/11/2010   GERD 09/11/2010   PEPTIC ULCER DISEASE 09/11/2010   COLONIC POLYPS, HX OF 09/11/2010   Lumbosacral spondylosis 02/27/2010    Orientation RESPIRATION BLADDER Height & Weight     Self, Time, Situation, Place  O2 (2L) Continent, External catheter Weight: (!) 350 lb (158.8 kg) Height:  5\' 6"  (167.6 cm)  BEHAVIORAL SYMPTOMS/MOOD NEUROLOGICAL BOWEL  NUTRITION STATUS      Continent Diet (See DC summary)  AMBULATORY STATUS COMMUNICATION OF NEEDS Skin   Extensive Assist Verbally Surgical wounds (OPEN REDUCTION INTERNAL FIXATION (ORIF) TIBIA FRACTURE)                       Personal Care Assistance Level of Assistance  Bathing, Feeding, Dressing Bathing Assistance: Limited assistance Feeding assistance: Independent Dressing Assistance: Limited assistance     Functional Limitations Info  Sight, Hearing, Speech Sight Info: Impaired Hearing Info: Adequate Speech Info: Adequate    SPECIAL CARE FACTORS FREQUENCY  PT (By licensed PT), OT (By licensed OT)     PT Frequency: 5x per week OT Frequency: 5x per week            Contractures Contractures Info: Not present    Additional Factors Info  Code Status, Allergies, Psychotropic, Insulin Sliding Scale Code Status Info: Full Allergies Info: Ceftriaxone Psychotropic Info: traZODone (DESYREL) tablet 200 mg   , Seroquel-100 mg Insulin Sliding Scale Info: insulin aspart (novoLOG) injection 0-9 Units   every 4 hours,insulin glargine-yfgn (SEMGLEE) injection 12 Units  2 times daily,       Current Medications (08/03/2021):  This is the current hospital active medication list Current Facility-Administered Medications  Medication Dose Route Frequency Provider Last Rate Last Admin   acetaminophen (TYLENOL) tablet 1,000 mg  1,000 mg Oral Q6H 08/05/2021 A, PA-C   1,000 mg at 08/03/21 1118   albuterol (PROVENTIL) (2.5 MG/3ML) 0.083% nebulizer solution 2.5 mg  2.5 mg Nebulization Q4H PRN 08/05/21,  PA-C       amLODipine (NORVASC) tablet 10 mg  10 mg Oral Daily Despina Hidden, PA-C   10 mg at 08/03/21 0902   atorvastatin (LIPITOR) tablet 10 mg  10 mg Oral Daily Despina Hidden, PA-C   10 mg at 08/03/21 0902   buPROPion ER (WELLBUTRIN SR) 12 hr tablet 200 mg  200 mg Oral BID Despina Hidden, PA-C   200 mg at 08/03/21 2620   clindamycin (CLEOCIN) IVPB 600 mg  600 mg Intravenous  Q8H Despina Hidden, PA-C 100 mL/hr at 08/03/21 1335 600 mg at 08/03/21 1335   dextrose 50 % solution 12.5 g  12.5 g Intravenous PRN Despina Hidden, PA-C   12.5 g at 08/01/21 0913   docusate sodium (COLACE) capsule 100 mg  100 mg Oral BID Despina Hidden, PA-C   100 mg at 08/03/21 3559   DULoxetine (CYMBALTA) DR capsule 90 mg  90 mg Oral Daily Despina Hidden, PA-C   90 mg at 08/03/21 7416   empagliflozin (JARDIANCE) tablet 25 mg  25 mg Oral Daily Despina Hidden, PA-C   25 mg at 08/03/21 0902   enoxaparin (LOVENOX) injection 40 mg  40 mg Subcutaneous Q24H Despina Hidden, PA-C   40 mg at 08/03/21 3845   hydrALAZINE (APRESOLINE) tablet 25 mg  25 mg Oral TID Despina Hidden, PA-C   25 mg at 08/03/21 3646   HYDROmorphone (DILAUDID) injection 1-2 mg  1-2 mg Intravenous Q4H PRN Rhetta Mura, MD   2 mg at 08/03/21 1338   insulin aspart (novoLOG) injection 0-9 Units  0-9 Units Subcutaneous Q4H Despina Hidden, PA-C   1 Units at 08/03/21 1118   insulin glargine-yfgn (SEMGLEE) injection 12 Units  12 Units Subcutaneous BID Rhetta Mura, MD   12 Units at 08/03/21 0901   ketorolac (TORADOL) 15 MG/ML injection 15 mg  15 mg Intravenous Q6H Ulyses Southward A, PA-C   15 mg at 08/03/21 1224   lurasidone (LATUDA) tablet 40 mg  40 mg Oral Daily Despina Hidden, PA-C   40 mg at 08/03/21 8032   methocarbamol (ROBAXIN) tablet 750 mg  750 mg Oral Q6H PRN Despina Hidden, PA-C   750 mg at 08/02/21 2225   Or   methocarbamol (ROBAXIN) 750 mg in dextrose 5 % 50 mL IVPB  750 mg Intravenous Q6H PRN Despina Hidden, PA-C       metoCLOPramide (REGLAN) tablet 5-10 mg  5-10 mg Oral Q8H PRN Despina Hidden, PA-C       Or   metoCLOPramide (REGLAN) injection 5-10 mg  5-10 mg Intravenous Q8H PRN Ulyses Southward A, PA-C       metoprolol tartrate (LOPRESSOR) tablet 50 mg  50 mg Oral BID Ulyses Southward A, PA-C   50 mg at 08/03/21 1224   mometasone-formoterol (DULERA) 100-5 MCG/ACT inhaler 2 puff  2 puff Inhalation BID  Despina Hidden, PA-C   2 puff at 08/03/21 0744   ondansetron (ZOFRAN) tablet 4 mg  4 mg Oral Q6H PRN Despina Hidden, PA-C       Or   ondansetron [facility]) injection 4 mg  4 mg Intravenous Q6H PRN Ulyses Southward A, PA-C       oxyCODONE (Oxy IR/ROXICODONE) immediate release tablet 20 mg  20 mg Oral Q4H PRN Rhetta Mura, MD       pantoprazole (PROTONIX) EC tablet 40 mg  40 mg Oral Daily Despina Hidden, PA-C  40 mg at 08/03/21 0902   polyethylene glycol (MIRALAX / GLYCOLAX) packet 17 g  17 g Oral Daily Despina Hidden, PA-C   17 g at 08/03/21 7616   polyethylene glycol (MIRALAX / GLYCOLAX) packet 17 g  17 g Oral Daily PRN Despina Hidden, PA-C       pregabalin (LYRICA) capsule 225 mg  225 mg Oral TID Ulyses Southward A, PA-C   225 mg at 08/03/21 0737   QUEtiapine (SEROQUEL) tablet 100 mg  100 mg Oral QHS Despina Hidden, PA-C   100 mg at 08/02/21 2219   senna-docusate (Senokot-S) tablet 1 tablet  1 tablet Oral QHS PRN Despina Hidden, PA-C       sodium chloride (OCEAN) 0.65 % nasal spray 1 spray  1 spray Each Nare PRN Rhetta Mura, MD       traZODone (DESYREL) tablet 200 mg  200 mg Oral QHS Ulyses Southward A, PA-C   200 mg at 08/02/21 2218     Discharge Medications: Please see discharge summary for a list of discharge medications.  Relevant Imaging Results:  Relevant Lab Results:   Additional Information SSN# [id number] Patient will need Bariatric Bed, vaccinnated with one booster  Patrice Paradise, LCSW

## 2021-08-03 NOTE — Progress Notes (Signed)
Orthopaedic Trauma Progress Note  SUBJECTIVE: Doing okay this morning. Having a lot of pain in the left leg. Received her oral medication about an hour ago but hasn't really been beneficial. Asking for IV pain medication. Was able to get up with therapies and stand on right leg while maintaining NWB on LLE for a few seconds. Patient is in agreement with therapies recommendation for SNF, notes she will need a bariatric bed. Prefers SNF in [place] or [place]  OBJECTIVE:  Vitals:   08/03/21 0745 08/03/21 0900  BP:    Pulse:  78  Resp:  18  Temp:    SpO2: 96% 95%    General: Sitting up in bed, NAD but uncomfortable.  Respiratory: No increased work of breathing.  Left lower extremity: Dressing clean, dry, intact.  Significant neuropathy at baseline.   Foot warm and well-perfused. Limited ankle motion due to previous fusion  IMAGING: Stable post op imaging.   LABS:  Results for orders placed or performed during the hospital encounter of 07/31/21 (from the past 24 hour(s))  Glucose, capillary     Status: Abnormal   Collection Time: 08/02/21 11:19 AM  Result Value Ref Range   Glucose-Capillary 181 (H) 70 - 99 mg/dL  Glucose, capillary     Status: Abnormal   Collection Time: 08/02/21  3:33 PM  Result Value Ref Range   Glucose-Capillary 236 (H) 70 - 99 mg/dL  Glucose, capillary     Status: Abnormal   Collection Time: 08/02/21  8:05 PM  Result Value Ref Range   Glucose-Capillary 225 (H) 70 - 99 mg/dL  Glucose, capillary     Status: Abnormal   Collection Time: 08/03/21 12:00 AM  Result Value Ref Range   Glucose-Capillary 218 (H) 70 - 99 mg/dL  Basic metabolic panel     Status: Abnormal   Collection Time: 08/03/21  1:29 AM  Result Value Ref Range   Sodium 137 135 - 145 mmol/L   Potassium 4.6 3.5 - 5.1 mmol/L   Chloride 105 98 - 111 mmol/L   CO2 24 22 - 32 mmol/L   Glucose, Bld 220 (H) 70 - 99 mg/dL   BUN 20 6 - 20 mg/dL   Creatinine, Ser 1.30 (H) 0.44 - 1.00 mg/dL   Calcium 8.5  (L) 8.9 - 10.3 mg/dL   GFR, Estimated 51 (L) >60 mL/min   Anion gap 8 5 - 15  CBC     Status: Abnormal   Collection Time: 08/03/21  1:29 AM  Result Value Ref Range   WBC 8.8 4.0 - 10.5 K/uL   RBC 3.58 (L) 3.87 - 5.11 MIL/uL   Hemoglobin 10.0 (L) 12.0 - 15.0 g/dL   HCT 86.5 (L) 78.4 - 69.6 %   MCV 92.7 80.0 - 100.0 fL   MCH 27.9 26.0 - 34.0 pg   MCHC 30.1 30.0 - 36.0 g/dL   RDW 29.5 28.4 - 13.2 %   Platelets 234 150 - 400 K/uL   nRBC 0.0 0.0 - 0.2 %  Glucose, capillary     Status: Abnormal   Collection Time: 08/03/21  4:20 AM  Result Value Ref Range   Glucose-Capillary 169 (H) 70 - 99 mg/dL  Glucose, capillary     Status: Abnormal   Collection Time: 08/03/21  7:45 AM  Result Value Ref Range   Glucose-Capillary 146 (H) 70 - 99 mg/dL    ASSESSMENT: [name] is a 60 y.o. female, 2 Days Post-Op s/p ORIF PERIPROSTHETIC TIBIA FRACTURE  CV/Blood  loss: Acute blood loss anemia, Hgb 10.0 this morning. Hemodynamically stable  PLAN: Weightbearing: NWB LLE Incisional and dressing care: Reinforce dressings as needed  Showering: ok to shower, keep dressing dry Orthopedic device(s): None  Pain management:  1. Tylenol 1000 mg q 6 hours scheduled 2. Robaxin 750 mg q 6 hours PRN 3. Oxycodone 10-15 mg q 4 hours PRN 4. Lyrica 225 mg TID 5. Dilaudid 0.5-1 mg q 4 hours PRN 6. Toradol 15 mg q 6 hours x 4 doses VTE prophylaxis: Lovenox, SCDs ID:  Ancef 2gm post op completed Foley/Lines:  No foley, KVO IVFs Impediments to Fracture Healing: diabetes Dispo: Therapies as tolerated, PT/OT recommending SNF. TOC following for placement. Patient will require bariatric bed at facility. Okay for discharge from ortho standpoint once cleared by medicine team and therapies Follow - up plan: 2 weeks after discharge for repeat x-rays and wound check  Contact information:  Truitt Merle MD, Ulyses Southward PA-C. After hours and holidays please check [email or web address] for group call information for Sports Med  Group   Ebonie Westerlund A. Michaelyn Barter, PA-C [phone] (office) [email or web address]

## 2021-08-04 ENCOUNTER — Encounter (HOSPITAL_COMMUNITY): Payer: Self-pay | Admitting: Student

## 2021-08-04 LAB — BASIC METABOLIC PANEL
Anion gap: 6 (ref 5–15)
BUN: 19 mg/dL (ref 6–20)
CO2: 26 mmol/L (ref 22–32)
Calcium: 8.5 mg/dL — ABNORMAL LOW (ref 8.9–10.3)
Chloride: 105 mmol/L (ref 98–111)
Creatinine, Ser: 1.17 mg/dL — ABNORMAL HIGH (ref 0.44–1.00)
GFR, Estimated: 54 mL/min — ABNORMAL LOW (ref >60)
Glucose, Bld: 241 mg/dL — ABNORMAL HIGH (ref 70–99)
Potassium: 4.6 mmol/L (ref 3.5–5.1)
Sodium: 137 mmol/L (ref 135–145)

## 2021-08-04 LAB — CBC WITH DIFFERENTIAL/PLATELET
Abs Immature Granulocytes: 0.02 10*3/uL (ref 0.00–0.07)
Basophils Absolute: 0 10*3/uL (ref 0.0–0.1)
Basophils Relative: 1 %
Eosinophils Absolute: 0.2 10*3/uL (ref 0.0–0.5)
Eosinophils Relative: 3 %
HCT: 31.8 % — ABNORMAL LOW (ref 36.0–46.0)
Hemoglobin: 9.7 g/dL — ABNORMAL LOW (ref 12.0–15.0)
Immature Granulocytes: 0 %
Lymphocytes Relative: 25 %
Lymphs Abs: 1.8 10*3/uL (ref 0.7–4.0)
MCH: 28.3 pg (ref 26.0–34.0)
MCHC: 30.5 g/dL (ref 30.0–36.0)
MCV: 92.7 fL (ref 80.0–100.0)
Monocytes Absolute: 0.7 10*3/uL (ref 0.1–1.0)
Monocytes Relative: 10 %
Neutro Abs: 4.4 10*3/uL (ref 1.7–7.7)
Neutrophils Relative %: 61 %
Platelets: 206 10*3/uL (ref 150–400)
RBC: 3.43 MIL/uL — ABNORMAL LOW (ref 3.87–5.11)
RDW: 14.8 % (ref 11.5–15.5)
WBC: 7.2 10*3/uL (ref 4.0–10.5)
nRBC: 0 % (ref 0.0–0.2)

## 2021-08-04 LAB — GLUCOSE, CAPILLARY
Glucose-Capillary: 168 mg/dL — ABNORMAL HIGH (ref 70–99)
Glucose-Capillary: 173 mg/dL — ABNORMAL HIGH (ref 70–99)
Glucose-Capillary: 181 mg/dL — ABNORMAL HIGH (ref 70–99)
Glucose-Capillary: 185 mg/dL — ABNORMAL HIGH (ref 70–99)
Glucose-Capillary: 202 mg/dL — ABNORMAL HIGH (ref 70–99)
Glucose-Capillary: 221 mg/dL — ABNORMAL HIGH (ref 70–99)

## 2021-08-04 MED ORDER — OXYCODONE HCL 5 MG PO TABS
20.0000 mg | ORAL_TABLET | ORAL | Status: DC | PRN
  Administered 2021-08-04 – 2021-08-05 (×3): 20 mg via ORAL
  Filled 2021-08-04 (×3): qty 4

## 2021-08-04 MED ORDER — INSULIN ASPART 100 UNIT/ML IJ SOLN
0.0000 [IU] | Freq: Three times a day (TID) | INTRAMUSCULAR | Status: DC
  Administered 2021-08-04: 3 [IU] via SUBCUTANEOUS
  Administered 2021-08-05: 1 [IU] via SUBCUTANEOUS

## 2021-08-04 MED ORDER — IBUPROFEN 200 MG PO TABS
600.0000 mg | ORAL_TABLET | Freq: Four times a day (QID) | ORAL | Status: DC
  Administered 2021-08-04 – 2021-08-08 (×16): 600 mg via ORAL
  Filled 2021-08-04 (×17): qty 3

## 2021-08-04 NOTE — Progress Notes (Signed)
Inpatient Diabetes Program Recommendations  AACE/ADA: New Consensus Statement on Inpatient Glycemic Control (2015)  Target Ranges:  Prepandial:   less than 140 mg/dL      Peak postprandial:   less than 180 mg/dL (1-2 hours)      Critically ill patients:  140 - 180 mg/dL  Results for [name], [name] (MRN [id number]) as of 08/04/2021 08:47  Ref. Range 08/03/2021 00:00 08/03/2021 04:20 08/03/2021 07:45 08/03/2021 11:15 08/03/2021 16:07 08/03/2021 20:19  Glucose-Capillary Latest Ref Range: 70 - 99 mg/dL 330 (H) 076 (H) 226 (H) 140 (H) 201 (H) 246 (H)  Results for [name], [name] (MRN [id number]) as of 08/04/2021 08:47  Ref. Range 08/04/2021 00:06 08/04/2021 04:28 08/04/2021 07:42  Glucose-Capillary Latest Ref Range: 70 - 99 mg/dL 638 (H) 937 (H) 342 (H)     Home DM Meds: Lantus 100 units BID       Jardiance 25 mg daily       Ozempic 1 mg qweek  Current Orders: Semglee 12 units BID      Novolog Sensitive Correction Scale/ SSI (0-9 units) Q4 hours      Jardiance 25 mg daily    MD- Note patient eating 100% meals per documentation.  Please consider:  1. Change Novolog SSI timing to TID AC + HS   2. Start Novolog Meal Coverage: Novolog 3 units TID with meals Hold if pt eats <50% of meal, Hold if pt NPO    --Will follow patient during hospitalization--  Ambrose Finland RN, MSN, CDE Diabetes Coordinator Inpatient Glycemic Control Team Team Pager: [phone] (8a-5p)

## 2021-08-04 NOTE — Progress Notes (Signed)
Physical Therapy Treatment Patient Details Name: [name] MRN: [id number] DOB: 12-02-1960 Today's Date: 08/04/2021   History of Present Illness The pt is a 60 yo female presenting 10/27 with L ankle pain after standing up on it last night and hearing a "crack". Of note, pt with L ankle fusion in sep 2022, re-fx on 10/18, and was d/c home where she was unable to maintain NWB status. Now s/p ORIF L tibial shaft 1028.  PMH includes: morbid obesity, CAD, CHF, DM II, HTN, MI, sleep apnea, COPD, CKD anxiety, depression, HLD, and anemia.    PT Comments    The pt was able to participate in session and demos improved ability to complete bed mobility without breaking wb precautions. However, the pt was unable to achieve full stand from EOB despite assist of +2 and elevated bed. The pt demos poor functional strength, power, and use of RLE to complete transfers at this time. Continue to recommend SNF for continued rehab and assist in attempts to reduce risk of re-fx and re-admission as pt continues to struggle to maintain NWB.     Recommendations for follow up therapy are one component of a multi-disciplinary discharge planning process, led by the attending physician.  Recommendations may be updated based on patient status, additional functional criteria and insurance authorization.  Follow Up Recommendations  Skilled nursing-short term rehab (<3 hours/day)     Assistance Recommended at Discharge Frequent or constant Supervision/Assistance  Equipment Recommendations  BSC (bariatric BSC)    Recommendations for Other Services       Precautions / Restrictions Precautions Precautions: Fall Precaution Comments: repeated fx of L ankle due to inability to maintain NWB Required Braces or Orthoses: Splint/Cast Splint/Cast: L lower leg wrapped ACE Restrictions Weight Bearing Restrictions: Yes LLE Weight Bearing: Non weight bearing Other Position/Activity Restrictions: Body habitus      Mobility  Bed Mobility Overal bed mobility: Needs Assistance Bed Mobility: Supine to Sit;Sit to Supine     Supine to sit: Min guard Sit to supine: Min guard   General bed mobility comments: pt with improved mobility, reduced use of LLE to push inbed    Transfers Overall transfer level: Needs assistance Equipment used: Rolling walker (2 wheels) Transfers: Sit to/from Stand Sit to Stand: Max assist;+2 safety/equipment;From elevated surface           General transfer comment: pt with x5 sit-stand attempts from various heights of elevated bed, but pt unable to complete sit-stand at this time while maintaining NWB. pt unable to generate power in RLE to achieve hip clearance    Ambulation/Gait             General Gait Details: pt unable to maintain NWB for stepping          Balance Overall balance assessment: Needs assistance Sitting-balance support: Feet supported Sitting balance-Leahy Scale: Fair Sitting balance - Comments: pt unable to lean outside BOS   Standing balance support: Bilateral upper extremity supported;Reliant on assistive device for balance Standing balance-Leahy Scale: Poor Standing balance comment: BUE support and still unable to achieve static stance                            Cognition Arousal/Alertness: Awake/alert Behavior During Therapy: WFL for tasks assessed/performed;Flat affect Overall Cognitive Status: Within Functional Limits for tasks assessed  General Comments: pt following commands, decerased insight/effort and problem solving to complete mobility. poor insight to assist needed        Exercises      General Comments General comments (skin integrity, edema, etc.): VSS on RA      Pertinent Vitals/Pain Pain Assessment: No/denies pain Breathing: normal Negative Vocalization: occasional moan/groan, low speech, negative/disapproving quality Facial Expression: smiling  or inexpressive Body Language: tense, distressed pacing, fidgeting Consolability: no need to console PAINAD Score: 2     PT Goals (current goals can now be found in the care plan section) Acute Rehab PT Goals Patient Stated Goal: to heal her foot PT Goal Formulation: With patient Time For Goal Achievement: 08/16/21 Potential to Achieve Goals: Poor Progress towards PT goals: Progressing toward goals    Frequency    Min 3X/week      PT Plan Current plan remains appropriate       AM-PAC PT "6 Clicks" Mobility   Outcome Measure  Help needed turning from your back to your side while in a flat bed without using bedrails?: None Help needed moving from lying on your back to sitting on the side of a flat bed without using bedrails?: None Help needed moving to and from a bed to a chair (including a wheelchair)?: Total Help needed standing up from a chair using your arms (e.g., wheelchair or bedside chair)?: Total Help needed to walk in hospital room?: Total Help needed climbing 3-5 steps with a railing? : Total 6 Click Score: 12    End of Session Equipment Utilized During Treatment: Gait belt Activity Tolerance: Patient limited by fatigue;Patient limited by pain Patient left: in bed;with call bell/phone within reach;with bed alarm set Nurse Communication: Mobility status PT Visit Diagnosis: Unsteadiness on feet (R26.81);Other abnormalities of gait and mobility (R26.89);Muscle weakness (generalized) (M62.81);Pain Pain - Right/Left: Left Pain - part of body: Ankle and joints of foot;Leg     Time: 1740-8144 PT Time Calculation (min) (ACUTE ONLY): 21 min  Charges:  $Therapeutic Activity: 8-22 mins                     Vickki Muff, PT, DPT   Acute Rehabilitation Department Pager #: [phone]   Ronnie Derby 08/04/2021, 1:49 PM

## 2021-08-04 NOTE — Care Management Important Message (Signed)
Important Message  Patient Details  Name: [name] MRN: [id number] Date of Birth: 03-15-1961   Medicare Important Message Given:  Yes     Aarya Robinson 08/04/2021, 1:59 PM

## 2021-08-04 NOTE — Plan of Care (Signed)
  Problem: Education: Goal: Knowledge of General Education information will improve Description Including pain rating scale, medication(s)/side effects and non-pharmacologic comfort measures Outcome: Progressing   Problem: Clinical Measurements: Goal: Ability to maintain clinical measurements within normal limits will improve Outcome: Progressing   Problem: Nutrition: Goal: Adequate nutrition will be maintained Outcome: Progressing   Problem: Coping: Goal: Level of anxiety will decrease Outcome: Progressing   Problem: Pain Managment: Goal: General experience of comfort will improve Outcome: Progressing   Problem: Safety: Goal: Ability to remain free from injury will improve Outcome: Progressing   

## 2021-08-04 NOTE — Progress Notes (Signed)
PROGRESS NOTE   [name]  ZJI:[id number] DOB: 23-Jan-1961 DOA: 07/31/2021 PCP: Wilmer Floor., MD  Brief Narrative:  60 year old white female, morbid obesity BMI 56, nonobstructive CAD on left heart cath 05/17/2020, COPD not on oxygen, HF PEF, IDDM + neuropathy Prior hospitalization 812-05/26/2021 right great toe osteomyelitis has non-STEMI pulmonary edema--supposed to be treated for 4 weeks with IV ceftriaxone Admitted 8/24 through 8/31 with bimalleolar fracture, ankle with dislocation underwent ORIF 8/25 current daily smoker Patient developed hardware failure and Charcot arthropathy with tibial talar joint and conservative measures were not successful--readmitted 9/7 through 06/25/2021 and underwent fusion left tibiotalar joint and left subtalar joint and fusion; discharged to SNF In the ED on 10/18 feeling a pop on her left ankle-was sent home from the ED--was told to follow-up with Dr. Josiah Lobo she lives alone she has had to ambulate and has been bearing weight on  Admitted for need for ORIF of left ankle Found to have cough chest pain but work-up negative  Hospital-Problem based course  Acute fracture distal tibia at proximal IM nail S/p ORIF left ankle Dr. Loney Loh, Therapy assessment recs SNF---Abx per ortho Pain better controlled still requiring IV Dilaudid-increase Oxy 20 mg to every 3 Try to limit use of Dilaudid but keep on board Transition to ibuprofen 600 every q6 from Toradol Current smoker COPD not on oxygen Continue Symbicort 2 puffs twice daily as needed, mometasone 2 puffs twice daily Nonobstructive CAD on cath 8/13, HFpEF Continue metoprolol 50 twice daily, hydralazine 25 3 times daily, amlodipine 10 Add back Jardiance 25 daily Can saline lock 10/29--periodic labs IDDM on insulin with neuropathy resumed Jardiance Cbg 168-181 adding back lantus at lower dose 12 bid [home =60 d] Hold ozempic Continue Lyrica to 25 3 times daily Superobesity BMI  56 Will need outpatient discussion about treatment and about weight loss Reports of chest pain, cough EKG reviewed--history not concerning for angina-states pain is when the patient coughs-troponin is negative and EKG shows no T wave inversions-monitor Depression Continue bupropion 200 twice daily Cymbalta 90 daily lurasidone 40 daily Seroquel 100 at bedtime and trazodone 200 at bedtime-May need to minimize this polypharmacy in the outpatient setting  DVT prophylaxis: Lovenox Code Status: Full Family Communication: Patient alone Disposition:  Status is: Inpatient  Remains inpatient appropriate because: Needs Skilled placement--probably will happen monday   Consultants:  Orthopedics Dr. Jena Gauss  Procedures: None yet  Antimicrobials: Clindamycin from admission   Subjective:  Pain improved-knows that she will need to go to therapy Eating drinking Used Dilaudid couple times overnight Tolerating 20 mg of Aloxi-she is not somnolent and so we will increase oral to try to wean off of IV Dilaudid  Objective: Vitals:   08/04/21 0739 08/04/21 1038 08/04/21 1039 08/04/21 1125  BP: (!) 117/51 (!) 117/51 (!) 117/51 (!) 126/53  Pulse: 74  71 70  Resp: 12   16  Temp: 98.4 F (36.9 C)   97.9 F (36.6 C)  TempSrc: Oral   Oral  SpO2: 97%   98%  Weight:      Height:        Intake/Output Summary (Last 24 hours) at 08/04/2021 1524 Last data filed at 08/04/2021 1300 Gross per 24 hour  Intake 600 ml  Output 1350 ml  Net -750 ml    Filed Weights   07/31/21 1622  Weight: (!) 158.8 kg    Examination:  Pleasant no distress seems more comfortable S1-S2 no murmur, RRR on monitors-poor exam Chest exam relatively clear no  wheeze no rales Abdomen is very obese --poor exam Right lower extremity slightly swollen  left left lower extremity wrapped in [place]   Data Reviewed: personally reviewed   CBC    Component Value Date/Time   WBC 7.2 08/04/2021 0123   RBC 3.43 (L) 08/04/2021  0123   HGB 9.7 (L) 08/04/2021 0123   HCT 31.8 (L) 08/04/2021 0123   PLT 206 08/04/2021 0123   MCV 92.7 08/04/2021 0123   MCH 28.3 08/04/2021 0123   MCHC 30.5 08/04/2021 0123   RDW 14.8 08/04/2021 0123   LYMPHSABS 1.8 08/04/2021 0123   MONOABS 0.7 08/04/2021 0123   EOSABS 0.2 08/04/2021 0123   BASOSABS 0.0 08/04/2021 0123   CMP Latest Ref Rng & Units 08/04/2021 08/03/2021 08/02/2021  Glucose 70 - 99 mg/dL 818(E) 993(Z) 169(C)  BUN 6 - 20 mg/dL 19 20 17   Creatinine 0.44 - 1.00 mg/dL ) 7.89(F) 8.10(F)  Sodium 135 - 145 mmol/L 137 137 135  Potassium 3.5 - 5.1 mmol/L 4.6 4.6 4.9  Chloride 98 - 111 mmol/L 105 105 101  CO2 22 - 32 mmol/L 26 24 26   Calcium 8.9 - 10.3 mg/dL 7.51(W) ) 2.5(E)  Total Protein 6.0 - 8.3 g/dL - - -  Total Bilirubin 0.3 - 1.2 mg/dL - - -  Alkaline Phos 39 - 117 U/L - - -  AST 0 - 37 U/L - - -  ALT 0 - 35 U/L - - -     Radiology Studies: No results found.   Scheduled Meds:  acetaminophen  1,000 mg Oral Q6H   amLODipine  10 mg Oral Daily   amoxicillin-clavulanate  1 tablet Oral Q12H   atorvastatin  10 mg Oral Daily   buPROPion  200 mg Oral BID   docusate sodium  100 mg Oral BID   DULoxetine  90 mg Oral Daily   empagliflozin  25 mg Oral Daily   enoxaparin (LOVENOX) injection  40 mg Subcutaneous Q24H   hydrALAZINE  25 mg Oral TID   insulin aspart  0-9 Units Subcutaneous TID WC   insulin glargine-yfgn  12 Units Subcutaneous BID   ketorolac  15 mg Intravenous Q6H   lurasidone  40 mg Oral Daily   metoprolol tartrate  50 mg Oral BID   mometasone-formoterol  2 puff Inhalation BID   pantoprazole  40 mg Oral Daily   polyethylene glycol  17 g Oral Daily   pregabalin  225 mg Oral TID   QUEtiapine  100 mg Oral QHS   traZODone  200 mg Oral QHS   Continuous Infusions:  methocarbamol (ROBAXIN) IV       LOS: 4 days   Time spent: 60  7.8(E, MD [facility] To contact the attending provider between 7A-7P or the covering  provider during after hours 7P-7A, please log into the web site [email or web address] and access using universal [place] password for that web site. If you do not have the password, please call the hospital operator.  08/04/2021, 3:24 PM

## 2021-08-04 NOTE — Progress Notes (Signed)
Orthopaedic Trauma Progress Note  SUBJECTIVE: Just received medication. Fairly comfortable currently. Pain better controlled today after medication adjustment yesterday. Today's is patient's birthday, sad she is in the hospital. Was hopeful she would be doing better than she is currently after her ankle fusion. Patient is in agreement with therapies recommendation for SNF, notes she will need a bariatric bed. Prefers SNF in [place] or [place]. Case manager has provided patient with list of SNF ratings in the area for her to review.  OBJECTIVE:  Vitals:   08/04/21 0733 08/04/21 0739  BP:  (!) 117/51  Pulse:  74  Resp:  12  Temp:  98.4 F (36.9 C)  SpO2: 97% 97%    General: Sitting up in bed, NAD but uncomfortable.  Respiratory: No increased work of breathing.  Left lower extremity: Dressing clean, dry, intact.  Patient declined dressing change this AM. Significant neuropathy at baseline.   Foot warm and well-perfused. Limited ankle motion due to previous fusion  IMAGING: Stable post op imaging.   LABS:  Results for orders placed or performed during the hospital encounter of 07/31/21 (from the past 24 hour(s))  Glucose, capillary     Status: Abnormal   Collection Time: 08/03/21 11:15 AM  Result Value Ref Range   Glucose-Capillary 140 (H) 70 - 99 mg/dL  Glucose, capillary     Status: Abnormal   Collection Time: 08/03/21  4:07 PM  Result Value Ref Range   Glucose-Capillary 201 (H) 70 - 99 mg/dL   Comment 1 Notify RN    Comment 2 Document in Chart   Glucose, capillary     Status: Abnormal   Collection Time: 08/03/21  8:19 PM  Result Value Ref Range   Glucose-Capillary 246 (H) 70 - 99 mg/dL  Glucose, capillary     Status: Abnormal   Collection Time: 08/04/21 12:06 AM  Result Value Ref Range   Glucose-Capillary 185 (H) 70 - 99 mg/dL  Basic metabolic panel     Status: Abnormal   Collection Time: 08/04/21  1:23 AM  Result Value Ref Range   Sodium 137 135 - 145 mmol/L    Potassium 4.6 3.5 - 5.1 mmol/L   Chloride 105 98 - 111 mmol/L   CO2 26 22 - 32 mmol/L   Glucose, Bld 241 (H) 70 - 99 mg/dL   BUN 19 6 - 20 mg/dL   Creatinine, Ser 1.61 (H) 0.44 - 1.00 mg/dL   Calcium 8.5 (L) 8.9 - 10.3 mg/dL   GFR, Estimated 54 (L) >60 mL/min   Anion gap 6 5 - 15  CBC with Differential/Platelet     Status: Abnormal   Collection Time: 08/04/21  1:23 AM  Result Value Ref Range   WBC 7.2 4.0 - 10.5 K/uL   RBC 3.43 (L) 3.87 - 5.11 MIL/uL   Hemoglobin 9.7 (L) 12.0 - 15.0 g/dL   HCT 09.6 (L) 04.5 - 40.9 %   MCV 92.7 80.0 - 100.0 fL   MCH 28.3 26.0 - 34.0 pg   MCHC 30.5 30.0 - 36.0 g/dL   RDW 81.1 91.4 - 78.2 %   Platelets 206 150 - 400 K/uL   nRBC 0.0 0.0 - 0.2 %   Neutrophils Relative % 61 %   Neutro Abs 4.4 1.7 - 7.7 K/uL   Lymphocytes Relative 25 %   Lymphs Abs 1.8 0.7 - 4.0 K/uL   Monocytes Relative 10 %   Monocytes Absolute 0.7 0.1 - 1.0 K/uL   Eosinophils Relative 3 %  Eosinophils Absolute 0.2 0.0 - 0.5 K/uL   Basophils Relative 1 %   Basophils Absolute 0.0 0.0 - 0.1 K/uL   Immature Granulocytes 0 %   Abs Immature Granulocytes 0.02 0.00 - 0.07 K/uL  Glucose, capillary     Status: Abnormal   Collection Time: 08/04/21  4:28 AM  Result Value Ref Range   Glucose-Capillary 168 (H) 70 - 99 mg/dL  Glucose, capillary     Status: Abnormal   Collection Time: 08/04/21  7:42 AM  Result Value Ref Range   Glucose-Capillary 173 (H) 70 - 99 mg/dL    ASSESSMENT: [name] is a 60 y.o. female, 3 Days Post-Op s/p ORIF PERIPROSTHETIC TIBIA FRACTURE  CV/Blood loss: Acute blood loss anemia, Hgb 9.7 this morning. Hemodynamically stable  PLAN: Weightbearing: NWB LLE Incisional and dressing care: Reinforce dressings as needed  Showering: ok to shower, keep dressing dry Orthopedic device(s): None  Pain management:  1. Tylenol 1000 mg q 6 hours scheduled 2. Robaxin 750 mg q 6 hours PRN 3. Oxycodone 20 mg q 4 hours PRN 4. Lyrica 225 mg TID 5. Dilaudid 1-2 mg q 4  hours PRN 6. Toradol 15 mg q 6 hours x 5 days VTE prophylaxis: Lovenox, SCDs ID:  Ancef 2gm post op completed Foley/Lines:  No foley, KVO IVFs Impediments to Fracture Healing: diabetes Dispo: Therapies as tolerated, PT/OT recommending SNF. TOC following for placement. Patient will require bariatric bed at facility. Okay for discharge from ortho standpoint once cleared by medicine team and therapies Follow - up plan: 2 weeks after discharge for repeat x-rays and wound check  Contact information:  Truitt Merle MD, Ulyses Southward PA-C. After hours and holidays please check [email or web address] for group call information for [facility]   Rosalin Buster A. Michaelyn Barter, PA-C [phone] (office) [email or web address]

## 2021-08-05 LAB — GLUCOSE, CAPILLARY
Glucose-Capillary: 134 mg/dL — ABNORMAL HIGH (ref 70–99)
Glucose-Capillary: 119 mg/dL — ABNORMAL HIGH (ref 70–99)
Glucose-Capillary: 137 mg/dL — ABNORMAL HIGH (ref 70–99)
Glucose-Capillary: 150 mg/dL — ABNORMAL HIGH (ref 70–99)

## 2021-08-05 MED ORDER — INSULIN GLARGINE-YFGN 100 UNIT/ML ~~LOC~~ SOLN
14.0000 [IU] | Freq: Two times a day (BID) | SUBCUTANEOUS | Status: DC
  Administered 2021-08-05 – 2021-08-08 (×7): 14 [IU] via SUBCUTANEOUS
  Filled 2021-08-05 (×9): qty 0.14

## 2021-08-05 MED ORDER — INSULIN ASPART 100 UNIT/ML IJ SOLN: 0.0000 [IU] | Freq: Every day | INTRAMUSCULAR | Status: DC

## 2021-08-05 MED ORDER — OXYCODONE HCL 5 MG PO TABS
20.0000 mg | ORAL_TABLET | ORAL | Status: DC | PRN
  Administered 2021-08-05 – 2021-08-08 (×20): 20 mg via ORAL
  Filled 2021-08-05 (×21): qty 4

## 2021-08-05 MED ORDER — INSULIN ASPART 100 UNIT/ML IJ SOLN
0.0000 [IU] | Freq: Three times a day (TID) | INTRAMUSCULAR | Status: DC
  Administered 2021-08-05 – 2021-08-07 (×4): 2 [IU] via SUBCUTANEOUS
  Administered 2021-08-07: 5 [IU] via SUBCUTANEOUS
  Administered 2021-08-07: 2 [IU] via SUBCUTANEOUS
  Administered 2021-08-08: 3 [IU] via SUBCUTANEOUS
  Administered 2021-08-08: 2 [IU] via SUBCUTANEOUS
  Administered 2021-08-08: 3 [IU] via SUBCUTANEOUS

## 2021-08-05 MED ORDER — INSULIN ASPART 100 UNIT/ML IJ SOLN
3.0000 [IU] | Freq: Three times a day (TID) | INTRAMUSCULAR | Status: DC
  Administered 2021-08-05 – 2021-08-08 (×11): 3 [IU] via SUBCUTANEOUS

## 2021-08-05 NOTE — Progress Notes (Addendum)
PROGRESS NOTE   [name]  [id number] DOB: [birth year] DOA: 07/31/2021 PCP: Helen Hashimoto., MD  Brief Narrative:  60 year old white female, morbid obesity BMI 56, nonobstructive CAD on left heart cath 05/17/2020, COPD not on oxygen, HF PEF, IDDM + neuropathy Prior hospitalization 812-05/26/2021 right great toe osteomyelitis has non-STEMI pulmonary edema--supposed to be treated for 4 weeks with IV ceftriaxone Admitted 8/24 through 8/31 with bimalleolar fracture, ankle with dislocation underwent ORIF 8/25 current daily smoker Patient developed hardware failure and Charcot arthropathy with tibial talar joint and conservative measures were not successful--readmitted 9/7 through 06/25/2021 and underwent fusion left tibiotalar joint and left subtalar joint and fusion; discharged to SNF In the ED on 10/18 feeling a pop on her left ankle-was sent home from the ED--was told to follow-up with Dr. Radford Pax she lives alone she has had to ambulate and has been bearing weight on  Admitted for need for ORIF of left ankle Found to have cough chest pain but work-up negative  Hospital-Problem based course  Acute fracture distal tibia at proximal IM nail S/p ORIF left ankle Dr. Pat Kocher, Therapy assessment recs SNF---Abx per ortho She is on Dilaudid and oxycodone, still complains of pain but looks comfortable. Try to limit use of Dilaudid but keep on board she was also transition to ibuprofen from Toradol. 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  I will increase to 14 units Lantus, she has received 2 units this  morning already, next dose will be tonight.  Continue SSI.   Hold ozempic Continue Lyrica to 25 3 times daily Superobesity BMI 56 Will need outpatient discussion about treatment and about weight loss Reports of chest pain, cough EKG reviewed--history not concerning for angina-states pain is when the patient coughs-troponin is negative and EKG shows no T wave inversions-monitor Depression Continue bupropion 200 twice daily Cymbalta 90 daily lurasidone 40 daily Seroquel 100 at bedtime and trazodone 200 at bedtime-May need to minimize this polypharmacy in the outpatient setting  CKD stage IIIb: At baseline.  DVT prophylaxis: Lovenox Code Status: Full Family Communication: Patient alone Disposition:  Status is: Inpatient  Remains inpatient appropriate because: Needs Skilled placement--TOC on board.   Consultants:  Orthopedics Dr. Doreatha Martin  Procedures:   Antimicrobials: Clindamycin from admission   Subjective:  Patient seen and examined.  She complains of pain in the left ankle but looks very comfortable.  She has not selected a SNF facility yet.  She was not even aware that she has to select 1, she had the list in front of her but she says that no one told her why the list is here.  Objective: Vitals:   08/04/21 2335 08/05/21 0431 08/05/21 0808 08/05/21 0827  BP: 140/64 140/65  (!) 140/54  Pulse: 73 67 72 69  Resp: 17 16 12 11   Temp: 98.1 F (36.7 C) 97.8 F (36.6 C)  98.4 F (36.9 C)  TempSrc: Oral Oral  Oral  SpO2: 90% 95%  94%  Weight:      Height:        Intake/Output Summary (Last 24 hours) at 08/05/2021 1108 Last data filed at 08/05/2021 0800 Gross per 24 hour  Intake 720 ml  Output 850 ml  Net -130 ml    Filed Weights   07/31/21 1622  Weight: (!) 158.8 kg    Examination:  General exam: Appears calm and comfortable, morbidly obese Respiratory system: Clear to auscultation. Respiratory effort normal. Cardiovascular system: S1 & S2 heard, RRR. No JVD,  murmurs, rubs, gallops or clicks. No pedal edema. Gastrointestinal system: Abdomen is nondistended, soft and nontender. No organomegaly or masses felt. Normal bowel sounds heard. Central nervous system: Alert and oriented. No focal neurological deficits. Extremities: Boot in left foot., Skin: No rashes, lesions or ulcers.  Psychiatry: Judgement and insight appear normal. Mood & affect appropriate.    Data Reviewed: personally reviewed   CBC    Component Value Date/Time   WBC 7.2 08/04/2021 0123   RBC 3.43 (L) 08/04/2021 0123   HGB 9.7 (L) 08/04/2021 0123   HCT 31.8 (L) 08/04/2021 0123   PLT 206 08/04/2021 0123   MCV 92.7 08/04/2021 0123   MCH 28.3 08/04/2021 0123   MCHC 30.5 08/04/2021 0123   RDW 14.8 08/04/2021 0123   LYMPHSABS 1.8 08/04/2021 0123   MONOABS 0.7 08/04/2021 0123   EOSABS 0.2 08/04/2021 0123   BASOSABS 0.0 08/04/2021 0123   CMP Latest Ref Rng & Units 08/04/2021 08/03/2021 08/02/2021  Glucose 70 - 99 mg/dL 269(S) 854(O) 270(J)  BUN 6 - 20 mg/dL 19 20 17   Creatinine 0.44 - 1.00 mg/dL ) 5.00(X) 3.81(W)  Sodium 135 - 145 mmol/L 137 137 135  Potassium 3.5 - 5.1 mmol/L 4.6 4.6 4.9  Chloride 98 - 111 mmol/L 105 105 101  CO2 22 - 32 mmol/L 26 24 26   Calcium 8.9 - 10.3 mg/dL 2.99(B) ) 7.1(I)  Total Protein 6.0 - 8.3 g/dL - - -  Total Bilirubin 0.3 - 1.2 mg/dL - - -  Alkaline Phos 39 - 117 U/L - - -  AST 0 - 37 U/L - - -  ALT 0 - 35 U/L - - -     Radiology Studies: No results found.   Scheduled Meds:  acetaminophen  1,000 mg Oral Q6H   amLODipine  10 mg Oral Daily   amoxicillin-clavulanate  1 tablet Oral Q12H   atorvastatin  10 mg Oral Daily   buPROPion  200 mg Oral BID   docusate sodium  100 mg Oral BID   DULoxetine  90 mg Oral Daily   empagliflozin  25 mg Oral Daily   enoxaparin (LOVENOX) injection  40 mg Subcutaneous Q24H   hydrALAZINE  25 mg Oral TID   ibuprofen  600 mg Oral QID   insulin aspart  0-15 Units Subcutaneous TID WC   insulin  aspart  0-5 Units Subcutaneous QHS   insulin aspart  3 Units Subcutaneous TID WC   insulin glargine-yfgn  12 Units Subcutaneous BID   lurasidone  40 mg Oral Daily   metoprolol tartrate  50 mg Oral BID   mometasone-formoterol  2 puff Inhalation BID   pantoprazole  40 mg Oral Daily   polyethylene glycol  17 g Oral Daily   pregabalin  225 mg Oral TID   QUEtiapine  100 mg Oral QHS   traZODone  200 mg Oral QHS   Continuous Infusions:  methocarbamol (ROBAXIN) IV       LOS: 5 days   Time spent: 45  8.9(F, MD [facility] To contact the attending provider between 7A-7P or the covering provider during after hours 7P-7A, please log into the web site [email or web address] and  access using universal [place] password for that web site. If you do not have the password, please call the hospital operator.  08/05/2021, 11:08 AM

## 2021-08-05 NOTE — Progress Notes (Signed)
OT Cancellation Note  Patient Details Name: [name] MRN: [id number] DOB: [birth year]   Cancelled Treatment:    Reason Eval/Treat Not Completed: Patient declined, no reason specified;Pain limiting ability to participate (Patient declined OT treatment stating she was in too much pain.  Patient was medicated prior to OT arrival and she states that the pain medication is not helping and she asked not to participate.) Alfonse Flavors, OTA Acute Rehabilitation Services  Pager [phone] Office [phone]  Dewain Penning 08/05/2021, 3:08 PM

## 2021-08-06 LAB — GLUCOSE, CAPILLARY
Glucose-Capillary: 96 mg/dL (ref 70–99)
Glucose-Capillary: 115 mg/dL — ABNORMAL HIGH (ref 70–99)
Glucose-Capillary: 133 mg/dL — ABNORMAL HIGH (ref 70–99)
Glucose-Capillary: 138 mg/dL — ABNORMAL HIGH (ref 70–99)

## 2021-08-06 NOTE — Progress Notes (Signed)
Ace wrap to left leg removed by MD as per pt.. foam dressing to left lat leg  in placed.site clean and dry, sutures intact. No drainage  nor redness noted.  Left heel abrasion with foam dressing. Affected leg elevated with pillow.

## 2021-08-06 NOTE — Consult Note (Signed)
   [facility] CM Inpatient Consult   08/06/2021  GAYNA BRADDY [birth year] [id number]  [place] [facility] [ACO] Patient: [employer] Medicare  Primary Care Provider:  Helen Hashimoto., MD  Patient screened for hospitalization with noted high risk score for unplanned readmission risk and  to assess for potential [place] [facility] service needs for post hospital transition.  Review of patient's medical record reveals patient is being recommended for a skilled nursig facility level of care.  Plan:  Need are to be met at a skilled nursing facility level of care for rehab.    For questions contact:   Natividad Brood, RN BSN [facility] Liaison  [phone] business mobile phone Toll free office [phone]  Fax number: [phone] [email or web address]

## 2021-08-06 NOTE — Progress Notes (Signed)
Orthopaedic Trauma Progress Note  SUBJECTIVE: Patient doing fairly well today. Pain still present but manageable. Pain meds have been adjusted over the past several days and currently she is comfortable. Patient expresses she is nervous about not having a splint or cast on her leg. Does not want to re-injure the leg. Remains agreeable to SNF. Wanting to speak with CM today  OBJECTIVE:  Vitals:   08/06/21 0700 08/06/21 0732  BP:  (!) 132/51  Pulse: 72 73  Resp: 14 12  Temp:  98.5 F (36.9 C)  SpO2:  94%    General: Sitting up in bed, NAD but uncomfortable.  Respiratory: No increased work of breathing.  Left lower extremity: Dressing removed, incisions clean, dry, intact.  New mepilex dressings applied. Swelling about lower leg improving. Small pressure ulcer on heel. Have floated heels to help offload pressure. Significant neuropathy at baseline.   Foot warm and well-perfused. Limited ankle motion due to previous fusion  IMAGING: Stable post op imaging.   LABS:  Results for orders placed or performed during the hospital encounter of 07/31/21 (from the past 24 hour(s))  Glucose, capillary     Status: Abnormal   Collection Time: 08/05/21 12:09 PM  Result Value Ref Range   Glucose-Capillary 119 (H) 70 - 99 mg/dL  Glucose, capillary     Status: Abnormal   Collection Time: 08/05/21  5:01 PM  Result Value Ref Range   Glucose-Capillary 137 (H) 70 - 99 mg/dL  Glucose, capillary     Status: Abnormal   Collection Time: 08/05/21  9:05 PM  Result Value Ref Range   Glucose-Capillary 150 (H) 70 - 99 mg/dL  Glucose, capillary     Status: None   Collection Time: 08/06/21  6:29 AM  Result Value Ref Range   Glucose-Capillary 96 70 - 99 mg/dL    ASSESSMENT: [name] is a 60 y.o. female, 5 Days Post-Op s/p ORIF PERIPROSTHETIC TIBIA FRACTURE  CV/Blood loss: Hgb stable. Hemodynamically stable  PLAN: Weightbearing: NWB LLE Incisional and dressing care: Ok to leave open to air. Change  mepilex as needed Showering: ok to shower, keep dressing dry Orthopedic device(s): None  Pain management:  1. Tylenol 1000 mg q 6 hours scheduled 2. Robaxin 750 mg q 6 hours PRN 3. Oxycodone 20 mg q 3 hours PRN 4. Lyrica 225 mg TID 5. Dilaudid 1-2 mg q 4 hours PRN 6. Ibuprofen 600 mg QID  VTE prophylaxis: Lovenox, SCDs ID:  Ancef 2gm post op completed Foley/Lines:  No foley, KVO IVFs Impediments to Fracture Healing: diabetes Dispo:  - Therapies as tolerated, PT/OT recommending SNF. TOC following for placement. Patient will require bariatric bed at facility. - Have consulted wound care for eval of left heel, appreciate recommendation.  - Okay for discharge from ortho standpoint once cleared by medicine team and therapies - Recommend Lovenox x 30 days at d/c for DVT prophylaxis Follow - up plan: 2 weeks after discharge for repeat x-rays and wound check  Contact information:  Truitt Merle MD, Ulyses Southward PA-C. After hours and holidays please check [email or web address] for group call information for [facility]   Blaine Hari A. Michaelyn Barter, PA-C [phone] (office) [email or web address]

## 2021-08-06 NOTE — TOC Progression Note (Addendum)
Transition of Care [facility]) - Progression Note    Patient Details  Name: [name] MRN: [id number] Date of Birth: [birth year]  Transition of Care [facility]) CM/SW Contact  Ivette Loyal, [place] Phone Number: 08/06/2021, 12:31 PM  Clinical Narrative:    CSW contacted Olegario Messier at [facility] to confirm bed availability, Olegario Messier at [facility] confirmed a private room. CSW contacted pt to provide update on bed availability. Pt would like to contact her daughters to look into [facility] for placement. CSW will follow up with pt for SNF decision.   CSW attempted contact pt daughter to follow up on decision for SNF, there was no answer. CSW went to speak with pt and was informed that pt has now been moved to 5N. CSW updated CSW on 5N who will follow up with pt for further DC planning needs.  [facility] has been started [place] # is [id number] approval is currently pending.    Expected Discharge Plan: Skilled Nursing Facility Barriers to Discharge: SNF Pending bed offer, Continued Medical Work up, English as a second language teacher  Expected Discharge Plan and Services Expected Discharge Plan: Skilled Nursing Facility       Living arrangements for the past 2 months: Single Family Home                                       Social Determinants of Health (SDOH) Interventions    Readmission Risk Interventions No flowsheet data found.

## 2021-08-06 NOTE — Progress Notes (Signed)
Transported to 5 [place] by bed. Belongings with pt. Report given to [facility]

## 2021-08-06 NOTE — TOC Progression Note (Signed)
Transition of Care [facility]) - Initial/Assessment Note    Patient Details  Name: [name] MRN: [id number] Date of Birth: 13-Nov-1960  [facility] CM/SW Contact:    Ralene Bathe, LCSWA Phone Number: 08/06/2021, 3:58 PM  Clinical Narrative:                 CSW contacted attending to inquire about d/c plans and was informed that the patient would be ready to d/c tomorrow.    CSW requested an order for a rapid COVID test in preparation for discharge tomorrow.    CSW spoke with patient and informed of the plan to d/c to [facility] tomorrow.    Expected Discharge Plan: Skilled Nursing Facility Barriers to Discharge: SNF Pending bed offer, Continued Medical Work up, English as a second language teacher   Patient Goals and CMS Choice Patient states their goals for this hospitalization and ongoing recovery are:: To be able to return home CMS Medicare.gov Compare Post Acute Care list provided to:: Patient Choice offered to / list presented to : Patient  Expected Discharge Plan and Services Expected Discharge Plan: Skilled Nursing Facility       Living arrangements for the past 2 months: Single Family Home                                      Prior Living Arrangements/Services Living arrangements for the past 2 months: Single Family Home Lives with:: Self Patient language and need for interpreter reviewed:: Yes          Care giver support system in place?: Yes (comment)      Activities of Daily Living Home Assistive Devices/Equipment: Dan Humphreys (specify type) ADL Screening (condition at time of admission) Patient's cognitive ability adequate to safely complete daily activities?: Yes Is the patient deaf or have difficulty hearing?: No Does the patient have difficulty seeing, even when wearing glasses/contacts?: No Does the patient have difficulty concentrating, remembering, or making decisions?: No Patient able to express need for assistance with ADLs?:  Yes Does the patient have difficulty dressing or bathing?: No Independently performs ADLs?: Yes (appropriate for developmental age) Does the patient have difficulty walking or climbing stairs?: Yes Weakness of Legs: Left Weakness of Arms/Hands: None  Permission Sought/Granted   Permission granted to share information with : Yes, Verbal Permission Granted  Share Information with NAME: Dahlia Client  Permission granted to share info w AGENCY: SNFs  Permission granted to share info w Relationship: Daughter  Permission granted to share info w Contact Information: [phone]  Emotional Assessment Appearance:: Appears stated age Attitude/Demeanor/Rapport: Engaged Affect (typically observed): Accepting, Adaptable Orientation: : Oriented to Self, Oriented to Place, Oriented to  Time, Oriented to Situation      Admission diagnosis:  Cough [R05.9] Fracture [T14.8XXA] Ankle fracture [S82.899A] Cellulitis of left lower extremity [L03.116] Closed fracture of shaft of right tibia, unspecified fracture morphology, initial encounter [S82.201A] Patient Active Problem List   Diagnosis Date Noted   Ankle fracture 07/31/2021   Cellulitis 07/31/2021   Arthritis of left ankle 06/11/2021   Closed bimalleolar fracture of left ankle 05/06/2021   Closed left ankle fracture 05/28/2020   Moderate protein malnutrition (HCC)    Adult BMI 50.0-59.9 kg/sq m (HCC)    Normocytic anemia 04/21/2018   Diabetic foot infection (HCC) 04/21/2018   Mild renal insufficiency 04/21/2018   Diabetic infection of right foot [facility])    Preventative health  care 04/14/2011   DYSMETABOLIC SYNDROME 12/08/2010   ULNAR NEUROPATHY, BILATERAL 10/01/2010   Insulin-requiring or dependent type II diabetes mellitus (HCC) 09/11/2010   HYPERLIPIDEMIA 09/11/2010   Morbid (severe) obesity due to excess calories (HCC) 09/11/2010   TOBACCO USE 09/11/2010   Depression with anxiety 09/11/2010   Essential hypertension 09/11/2010   GERD  09/11/2010   PEPTIC ULCER DISEASE 09/11/2010   COLONIC POLYPS, HX OF 09/11/2010   Lumbosacral spondylosis 02/27/2010   PCP:  Wilmer Floor., MD Pharmacy:   [facility] [phone] Rosalita Levan, [place] - [address] AT [facility] [address] [place] [address] Phone: [phone] Fax: [phone]  [id number] - [place], [place] - [address] [facility] [place] [address] Phone: [phone] Fax: [phone]     Social Determinants of Health (SDOH) Interventions    Readmission Risk Interventions No flowsheet data found.

## 2021-08-06 NOTE — Progress Notes (Signed)
[facility] PROGRESS NOTE    Progress Note  [name]  [id number] DOB: Jan 10, 1961 DOA: 07/31/2021 PCP: Helen Hashimoto., MD     Brief Narrative:   [name] is an 60 y.o. 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  Came into the ED again on 08/01/2019 for ankle pain white count of 13 hemoglobin of 12    Assessment/Plan:   Left ankle acute perihardware fracture: Surgery was consulted status post open reduction internal fixation of the left tibial shaft Currently on Dilaudid and oxycodone still complaining of pain but looks comfortable. Ambulation and anticoagulation per orthopedic surgery. Further management per Ortho. Physical therapy recommended skilled nursing facility.  Patient will require bariatric bed. Follow-up with orthopedic surgery in 2 weeks after discharge for repeat x-ray and wound check. Okay to shower keep dry dressing.  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 She was continued on Jardiance her blood glucose seems to be well controlled.  Nonobstructive CAD: Continue metoprolol, hydralazine and amlodipine.  Morbid obesity: Counseling. Estimated body mass index is 56.49 kg/m as calculated from the following:   Height as of this encounter: 5\' 6"  (1.676 m).   Weight as of this encounter: 158.8 kg.  Major depression: Continue bupropion, Cymbalta lurasidone and  Seroquel bedtime along with trazodone.  Chronic kidney disease stage IIIb: Creatinine at baseline.  Tobacco abuse: Not on oxygen continue inhalers.   DVT prophylaxis: lovneox Family Communication:none Status is: Inpatient  Remains inpatient appropriate because: Will need skilled nursing facility transfer to [facility] is on board.        Code Status:     Code Status Orders  (From admission, onward)           Start     Ordered   07/31/21 2124  Full code  Continuous        07/31/21 2125           Code Status History     Date Active Date Inactive Code Status Order ID Comments User Context   06/11/2021 1657 06/25/2021 2041 Full Code [id number]  Vivien Rota Inpatient   05/28/2020 1240 06/05/2020 0656 Full Code [id number]  Mercy Riding, MD ED   04/21/2018 1958 04/27/2018 2157 Full Code [id number]  Opyd, Ilene Qua, MD ED         IV Access:   Peripheral IV   Procedures and diagnostic studies:   No results found.   Medical Consultants:   None.   Subjective:    [name] relates her pain is controlled has not had a bowel movement tolerating her diet.  Objective:    Vitals:   08/06/21 0048 08/06/21 0426 08/06/21 0700 08/06/21 0732  BP:  (!) 139/56  (!) 132/51  Pulse:  70 72 73  Resp:  14 14 12   Temp:  98.2 F (36.8 C)  98.5 F (36.9 C)  TempSrc:  Oral  Oral  SpO2: 93% 94%  94%  Weight:  Height:       SpO2: 94 % O2 Flow Rate (L/min): 1 L/min   Intake/Output Summary (Last 24 hours) at 08/06/2021 0917 Last data filed at 08/06/2021 0300 Gross per 24 hour  Intake --  Output 2700 ml  Net -2700 ml   Filed Weights   07/31/21 1622  Weight: (!) 158.8 kg    Exam: General exam: In no acute distress. Respiratory system: Good air movement and clear to auscultation. Cardiovascular system: S1 & S2 heard, RRR. No JVD. Gastrointestinal system: Abdomen is nondistended, soft and nontender.  Extremities: No pedal edema. Skin: No  rashes, lesions or ulcers Psychiatry: Judgement and insight appear normal. Mood & affect appropriate.    Data Reviewed:    Labs: Basic Metabolic Panel: Recent Labs  Lab 07/31/21 1754 08/02/21 0015 08/03/21 0129 08/04/21 0123  NA 133* 135 137 137  K 4.3 4.9 4.6 4.6  CL 98 101 105 105  CO2 24 26 24 26   GLUCOSE 83 227* 220* 241*  BUN 12 17 20 19   CREATININE 1.05* 1.26* 1.23* 1.17*  CALCIUM 8.8* 8.6* 8.5* 8.5*   GFR Estimated Creatinine Clearance: 81 mL/min (A) (by C-G formula based on SCr of 1.17 mg/dL (H)). Liver Function Tests: No results for input(s): AST, ALT, ALKPHOS, BILITOT, PROT, ALBUMIN in the last 168 hours. No results for input(s): LIPASE, AMYLASE in the last 168 hours. No results for input(s): AMMONIA in the last 168 hours. Coagulation profile No results for input(s): INR, PROTIME in the last 168 hours. COVID-19 Labs  No results for input(s): DDIMER, FERRITIN, LDH, CRP in the last 72 hours.  Lab Results  Component Value Date   SARSCOV2NAA NEGATIVE 07/31/2021   SARSCOV2NAA NEGATIVE 06/24/2021   [place] NEGATIVE 06/20/2021   [place] NEGATIVE 06/11/2021    CBC: Recent Labs  Lab 07/31/21 1754 08/01/21 0501 08/02/21 0015 08/03/21 0129 08/04/21 0123  WBC 13.2* 11.1* 9.5 8.8 7.2  NEUTROABS 9.6*  --   --   --  4.4  HGB 12.2 11.7* 10.9* 10.0* 9.7*  HCT 40.3 37.8 35.7* 33.2* 31.8*  MCV 93.5 91.3 90.8 92.7 92.7  PLT 270 257 243 234 206   Cardiac Enzymes: No results for input(s): CKTOTAL, CKMB, CKMBINDEX, TROPONINI in the last 168 hours. BNP (last 3 results) No results for input(s): PROBNP in the last 8760 hours. CBG: Recent Labs  Lab 08/05/21 0650 08/05/21 1209 08/05/21 1701 08/05/21 2105 08/06/21 0629  GLUCAP 134* 119* 137* 150* 96   D-Dimer: No results for input(s): DDIMER in the last 72 hours. Hgb A1c: No results for input(s): HGBA1C in the last 72 hours. Lipid Profile: No results for input(s): CHOL, HDL, LDLCALC, TRIG, CHOLHDL,  LDLDIRECT in the last 72 hours. Thyroid function studies: No results for input(s): TSH, T4TOTAL, T3FREE, THYROIDAB in the last 72 hours.  Invalid input(s): FREET3 Anemia work up: No results for input(s): VITAMINB12, FOLATE, FERRITIN, TIBC, IRON, RETICCTPCT in the last 72 hours. Sepsis Labs: Recent Labs  Lab 08/01/21 0501 08/02/21 0015 08/03/21 0129 08/04/21 0123  WBC 11.1* 9.5 8.8 7.2   Microbiology Recent Results (from the past 240 hour(s))  Resp Panel by RT-PCR (Flu A&B, Covid) Nasopharyngeal Swab     Status: None   Collection Time: 07/31/21  5:54 PM   Specimen: Nasopharyngeal Swab; Nasopharyngeal(NP) swabs in vial transport medium  Result Value Ref Range Status   SARS Coronavirus 2 by RT PCR NEGATIVE NEGATIVE Final    Comment: (NOTE) SARS-CoV-2 target nucleic acids are NOT DETECTED.  The  SARS-CoV-2 RNA is generally detectable in upper respiratory specimens during the acute phase of infection. The lowest concentration of SARS-CoV-2 viral copies this assay can detect is 138 copies/mL. A negative result does not preclude SARS-Cov-2 infection and should not be used as the sole basis for treatment or other patient management decisions. A negative result may occur with  improper specimen collection/handling, submission of specimen other than nasopharyngeal swab, presence of viral mutation(s) within the areas targeted by this assay, and inadequate number of viral copies(<138 copies/mL). A negative result must be combined with clinical observations, patient history, and epidemiological information. The expected result is Negative.  Fact Sheet for Patients:  BloggerCourse.com  Fact Sheet for Healthcare Providers:  SeriousBroker.it  This test is no t yet approved or cleared by the [place] FDA and  has been authorized for detection and/or diagnosis of SARS-CoV-2 by FDA under an Emergency Use Authorization (EUA). This EUA  will remain  in effect (meaning this test can be used) for the duration of the COVID-19 declaration under Section 564(b)(1) of the Act, 21 U.S.C.section 360bbb-3(b)(1), unless the authorization is terminated  or revoked sooner.       Influenza A by PCR NEGATIVE NEGATIVE Final   Influenza B by PCR NEGATIVE NEGATIVE Final    Comment: (NOTE) The Xpert Xpress SARS-CoV-2/FLU/RSV plus assay is intended as an aid in the diagnosis of influenza from Nasopharyngeal swab specimens and should not be used as a sole basis for treatment. Nasal washings and aspirates are unacceptable for Xpert Xpress SARS-CoV-2/FLU/RSV testing.  Fact Sheet for Patients: BloggerCourse.com  Fact Sheet for Healthcare Providers: SeriousBroker.it  This test is not yet approved or cleared by the [place] FDA and has been authorized for detection and/or diagnosis of SARS-CoV-2 by FDA under an Emergency Use Authorization (EUA). This EUA will remain in effect (meaning this test can be used) for the duration of the COVID-19 declaration under Section 564(b)(1) of the Act, 21 U.S.C. section 360bbb-3(b)(1), unless the authorization is terminated or revoked.  Performed at [facility], [address], [place], [place] [address]      Medications:    acetaminophen  1,000 mg Oral Q6H   amLODipine  10 mg Oral Daily   amoxicillin-clavulanate  1 tablet Oral Q12H   atorvastatin  10 mg Oral Daily   buPROPion  200 mg Oral BID   docusate sodium  100 mg Oral BID   DULoxetine  90 mg Oral Daily   empagliflozin  25 mg Oral Daily   enoxaparin (LOVENOX) injection  40 mg Subcutaneous Q24H   hydrALAZINE  25 mg Oral TID   ibuprofen  600 mg Oral QID   insulin aspart  0-15 Units Subcutaneous TID WC   insulin aspart  0-5 Units Subcutaneous QHS   insulin aspart  3 Units Subcutaneous TID WC   insulin glargine-yfgn  14 Units Subcutaneous BID   lurasidone  40 mg Oral Daily    metoprolol tartrate  50 mg Oral BID   mometasone-formoterol  2 puff Inhalation BID   pantoprazole  40 mg Oral Daily   polyethylene glycol  17 g Oral Daily   pregabalin  225 mg Oral TID   QUEtiapine  100 mg Oral QHS   traZODone  200 mg Oral QHS   Continuous Infusions:  methocarbamol (ROBAXIN) IV        LOS: 6 days   Marinda Elk  [facility]  08/06/2021, 9:17 AM

## 2021-08-06 NOTE — Progress Notes (Signed)
Pt . Level of care changed to med-surg with order to transfer to ortho med -surge  unit . Pt made aware

## 2021-08-06 NOTE — Progress Notes (Signed)
Occupational Therapy Treatment Patient Details Name: [name] MRN: [id number] DOB: 09/25/1961 Today's Date: 08/06/2021   History of present illness The pt is a 60 yo female presenting 10/27 with L ankle pain after standing up on it last night and hearing a "crack". Of note, pt with L ankle fusion in sep 2022, re-fx on 10/18, and was d/c home where she was unable to maintain NWB status. Now s/p ORIF L tibial shaft 1028.  PMH includes: morbid obesity, CAD, CHF, DM II, HTN, MI, sleep apnea, COPD, CKD anxiety, depression, HLD, and anemia.   OT comments  Patient with good participation this date.  Able to complete basic bed mobility with up to Min A and cues for WBS, and sit to stand this date with up to Mod A of 2.  Unfortunately she was unable to progress to transferring to [facility] or recliner this date.  OT will continue efforts in the acute setting, but SNF continues to be recommended for post acute rehab prior to returning home.     Recommendations for follow up therapy are one component of a multi-disciplinary discharge planning process, led by the attending physician.  Recommendations may be updated based on patient status, additional functional criteria and insurance authorization.    Follow Up Recommendations  Skilled nursing-short term rehab (<3 hours/day)    Assistance Recommended at Discharge Frequent or constant Supervision/Assistance  Equipment Recommendations  Hospital bed;BSC    Recommendations for Other Services      Precautions / Restrictions Precautions Precautions: Fall Precaution Comments: repeated fx of L ankle due to inability to maintain NWB Required Braces or Orthoses: Splint/Cast Splint/Cast: L lower leg wrapped ACE Restrictions Weight Bearing Restrictions: Yes LLE Weight Bearing: Non weight bearing Other Position/Activity Restrictions: Body habitus       Mobility Bed Mobility Overal bed mobility: Needs Assistance Bed Mobility: Supine to Sit;Sit to Supine    Sidelying to sit: Min assist Supine to sit: Supervision Sit to supine: Min guard Sit to sidelying: Min assist General bed mobility comments: increased time and use of rails    Transfers Overall transfer level: Needs assistance Equipment used:  (bariatric RW) Transfers: Sit to/from Stand Sit to Stand: Mod assist;Max assist;+2 physical assistance;From elevated surface           General transfer comment: pt placing some weight through PT foot (~25%) when transitioning from sit to stand, able to maintain NWB once standing and with verbal cue from PT     Balance Overall balance assessment: Needs assistance Sitting-balance support: No upper extremity supported;Feet supported Sitting balance-Leahy Scale: Fair     Standing balance support: Bilateral upper extremity supported;Reliant on assistive device for balance Standing balance-Leahy Scale: Poor Standing balance comment: modA x2 with BUE support of RW                                                Cognition Arousal/Alertness: Awake/alert Behavior During Therapy: WFL for tasks assessed/performed;Flat affect Overall Cognitive Status: Within Functional Limits for tasks assessed                                 General Comments: pt is able to verbalize WB restrictions but requires verbal cues to maintain during mobility  General Comments VSS on RA    Pertinent Vitals/ Pain       Pain Assessment: Faces Faces Pain Scale: Hurts little more Pain Location: L LE Pain Descriptors / Indicators: Aching Pain Intervention(s): Monitored during session                                                          Frequency  Min 2X/week        Progress Toward Goals  OT Goals(current goals can now be found in the care plan section)  Progress towards OT goals: Progressing toward goals  Acute Rehab OT Goals Patient Stated Goal: Get back to  therapy OT Goal Formulation: With patient Time For Goal Achievement: 08/16/21 Potential to Achieve Goals: Good  Plan      Co-evaluation    PT/OT/SLP Co-Evaluation/Treatment: Yes Reason for Co-Treatment: For patient/therapist safety PT goals addressed during session: Mobility/safety with mobility;Balance;Proper use of DME;Strengthening/ROM OT goals addressed during session: ADL's and self-care      AM-PAC OT "6 Clicks" Daily Activity     Outcome Measure   Help from another person eating meals?: None Help from another person taking care of personal grooming?: None Help from another person toileting, which includes using toliet, bedpan, or urinal?: A Lot Help from another person bathing (including washing, rinsing, drying)?: A Lot Help from another person to put on and taking off regular upper body clothing?: A Little Help from another person to put on and taking off regular lower body clothing?: A Lot 6 Click Score: 17    End of Session Equipment Utilized During Treatment: Gait belt  OT Visit Diagnosis: Unsteadiness on feet (R26.81);Muscle weakness (generalized) (M62.81);Pain Pain - part of body: Ankle and joints of foot   Activity Tolerance Patient tolerated treatment well   Patient Left in bed;with call bell/phone within reach   Nurse Communication Mobility status        Time: 0623-7628 OT Time Calculation (min): 23 min  Charges: OT General Charges $OT Visit: 1 Visit OT Treatments $Therapeutic Activity: 8-22 mins  08/06/2021  RP, OTR/L  Acute Rehabilitation Services  Office:  [phone]   Suzanna Obey 08/06/2021, 3:18 PM

## 2021-08-06 NOTE — Consult Note (Signed)
WOC Nurse Consult Note: Patient receiving care in [facility] 2C5. Primary RN present at time of my assessment. Reason for Consult: left heel wound Wound type: Medical device related PI, stage 2. Pressure Injury POA: Yes.  Patient states she thinks it started when she had on a walking cast. Measurement: 0.6 cm x 0.6 cm x no depth Wound bed: 100% pink Drainage (amount, consistency, odor) none Periwound: intact. Dressing procedure/placement/frequency: Wash left posterior heel with soap and water, pat dry. Place a small foam dressing over the wound. Perform daily.  This treatment was performed during my visit today.  Monitor the wound area(s) for worsening of condition such as: Signs/symptoms of infection,  Increase in size,  Development of or worsening of odor, Development of pain, or increased pain at the affected locations.  Notify the medical team if any of these develop.  Thank you for the consult.  Discussed plan of care with the patient and bedside nurse.  WOC nurse will not follow at this time.  Please re-consult the WOC team if needed.  Helmut Muster, RN, MSN, CWOCN, CNS-BC, pager [phone]

## 2021-08-06 NOTE — Progress Notes (Signed)
Physical Therapy Treatment Patient Details Name: [name] MRN: [id number] DOB: 04/22/1961 Today's Date: 08/06/2021   History of Present Illness The pt is a 60 yo female presenting 10/27 with L ankle pain after standing up on it last night and hearing a "crack". Of note, pt with L ankle fusion in sep 2022, re-fx on 10/18, and was d/c home where she was unable to maintain NWB status. Now s/p ORIF L tibial shaft 1028.  PMH includes: morbid obesity, CAD, CHF, DM II, HTN, MI, sleep apnea, COPD, CKD anxiety, depression, HLD, and anemia.    PT Comments    Pt tolerates treatment well but continues to require significant physical assistance to transfer. Pt does continue to benefit from verbal cues to maintain WB precautions with sit to stand transfers, consistently applying some weight through LLE when initiating stand, however pt demonstrates the ability to remove weight and maintain NWB once in standing position and verbally cued. Pt will benefit from continued aggressive mobilization and PT services to reduce falls risk and improve maintenance of WB precautions.   Recommendations for follow up therapy are one component of a multi-disciplinary discharge planning process, led by the attending physician.  Recommendations may be updated based on patient status, additional functional criteria and insurance authorization.  Follow Up Recommendations  Skilled nursing-short term rehab (<3 hours/day)     Assistance Recommended at Discharge Intermittent Supervision/Assistance  Equipment Recommendations   (bariatric BSC)    Recommendations for Other Services       Precautions / Restrictions Precautions Precautions: Fall Precaution Comments: repeated fx of L ankle due to inability to maintain NWB Restrictions Weight Bearing Restrictions: Yes LLE Weight Bearing: Non weight bearing     Mobility  Bed Mobility Overal bed mobility: Needs Assistance Bed Mobility: Supine to Sit;Sit to Supine      Supine to sit: Supervision Sit to supine: Min guard   General bed mobility comments: increased time and use of rails    Transfers Overall transfer level: Needs assistance Equipment used:  (bariatric RW) Transfers: Sit to/from Stand Sit to Stand: Mod assist;Max assist;+2 physical assistance;From elevated surface (modA x2 for 1st 2 transfers, maxA x2 for last 2 transfers)           General transfer comment: pt placing some weight through PT foot (~25%) when transitioning from sit to stand, able to maintain NWB once standing and with verbal cue from PT    Ambulation/Gait                 Stairs             Wheelchair Mobility    Modified Rankin (Stroke Patients Only)       Balance Overall balance assessment: Needs assistance Sitting-balance support: No upper extremity supported;Feet supported Sitting balance-Leahy Scale: Fair     Standing balance support: Bilateral upper extremity supported Standing balance-Leahy Scale: Poor Standing balance comment: modA x2 with BUE support of RW                            Cognition Arousal/Alertness: Awake/alert Behavior During Therapy: WFL for tasks assessed/performed;Flat affect Overall Cognitive Status: Within Functional Limits for tasks assessed                                 General Comments: pt is able to verbalize WB restrictions but requires verbal cues to maintain  during mobility        Exercises      General Comments General comments (skin integrity, edema, etc.): VSS on RA      Pertinent Vitals/Pain Pain Assessment: Faces Faces Pain Scale: Hurts little more Pain Location: L LE Pain Descriptors / Indicators: Aching Pain Intervention(s): Monitored during session    Home Living                          Prior Function            PT Goals (current goals can now be found in the care plan section) Acute Rehab PT Goals Patient Stated Goal: to heal her  foot Progress towards PT goals: Progressing toward goals (slowly)    Frequency    Min 3X/week      PT Plan Current plan remains appropriate    Co-evaluation PT/OT/SLP Co-Evaluation/Treatment: Yes Reason for Co-Treatment: For patient/therapist safety;To address functional/ADL transfers PT goals addressed during session: Mobility/safety with mobility;Balance;Proper use of DME;Strengthening/ROM        AM-PAC PT "6 Clicks" Mobility   Outcome Measure  Help needed turning from your back to your side while in a flat bed without using bedrails?: A Little Help needed moving from lying on your back to sitting on the side of a flat bed without using bedrails?: A Little Help needed moving to and from a bed to a chair (including a wheelchair)?: Total Help needed standing up from a chair using your arms (e.g., wheelchair or bedside chair)?: Total Help needed to walk in hospital room?: Total Help needed climbing 3-5 steps with a railing? : Total 6 Click Score: 10    End of Session   Activity Tolerance: Patient tolerated treatment well Patient left: in bed;with call bell/phone within reach;with bed alarm set Nurse Communication: Mobility status PT Visit Diagnosis: Unsteadiness on feet (R26.81);Other abnormalities of gait and mobility (R26.89);Muscle weakness (generalized) (M62.81);Pain Pain - Right/Left: Left Pain - part of body: Ankle and joints of foot;Leg     Time: 1410-1438 PT Time Calculation (min) (ACUTE ONLY): 28 min  Charges:  $Therapeutic Activity: 8-22 mins                     Arlyss Gandy, PT, DPT Acute Rehabilitation Pager: [phone] Office [phone]    Arlyss Gandy 08/06/2021, 2:53 PM

## 2021-08-07 LAB — GLUCOSE, CAPILLARY
Glucose-Capillary: 140 mg/dL — ABNORMAL HIGH (ref 70–99)
Glucose-Capillary: 126 mg/dL — ABNORMAL HIGH (ref 70–99)
Glucose-Capillary: 202 mg/dL — ABNORMAL HIGH (ref 70–99)
Glucose-Capillary: 123 mg/dL — ABNORMAL HIGH (ref 70–99)

## 2021-08-07 LAB — RESP PANEL BY RT-PCR (FLU A&B, COVID) ARPGX2
Influenza A by PCR: NEGATIVE
Influenza B by PCR: NEGATIVE
SARS Coronavirus 2 by RT PCR: NEGATIVE

## 2021-08-07 NOTE — TOC Progression Note (Signed)
Transition of Care [facility]) - Initial/Assessment Note    Patient Details  Name: [name] MRN: [id number] Date of Birth: 09-Dec-1960  [facility]) CM/SW Contact:    Ralene Bathe, LCSWA Phone Number: 08/07/2021, 11:47 AM  Clinical Narrative:                 CSW contacted [facility] to inform that facility that the patient is ready for d/c.  The facility informed CSW that they are now unable to accept the patient until tomorrow because they have to get a bariatric bed.   Expected Discharge Plan: Skilled Nursing Facility Barriers to Discharge: SNF Pending bed offer, Continued Medical Work up, English as a second language teacher   Patient Goals and CMS Choice Patient states their goals for this hospitalization and ongoing recovery are:: To be able to return home CMS Medicare.gov Compare Post Acute Care list provided to:: Patient Choice offered to / list presented to : Patient  Expected Discharge Plan and Services Expected Discharge Plan: Skilled Nursing Facility       Living arrangements for the past 2 months: Single Family Home                                      Prior Living Arrangements/Services Living arrangements for the past 2 months: Single Family Home Lives with:: Self Patient language and need for interpreter reviewed:: Yes          Care giver support system in place?: Yes (comment)      Activities of Daily Living Home Assistive Devices/Equipment: Dan Humphreys (specify type) ADL Screening (condition at time of admission) Patient's cognitive ability adequate to safely complete daily activities?: Yes Is the patient deaf or have difficulty hearing?: No Does the patient have difficulty seeing, even when wearing glasses/contacts?: No Does the patient have difficulty concentrating, remembering, or making decisions?: No Patient able to express need for assistance with ADLs?: Yes Does the patient have difficulty dressing or bathing?: No Independently performs ADLs?:  Yes (appropriate for developmental age) Does the patient have difficulty walking or climbing stairs?: Yes Weakness of Legs: Left Weakness of Arms/Hands: None  Permission Sought/Granted   Permission granted to share information with : Yes, Verbal Permission Granted  Share Information with NAME: Dahlia Client  Permission granted to share info w AGENCY: SNFs  Permission granted to share info w Relationship: Daughter  Permission granted to share info w Contact Information: [phone]  Emotional Assessment Appearance:: Appears stated age Attitude/Demeanor/Rapport: Engaged Affect (typically observed): Accepting, Adaptable Orientation: : Oriented to Self, Oriented to Place, Oriented to  Time, Oriented to Situation      Admission diagnosis:  Cough [R05.9] Fracture [T14.8XXA] Ankle fracture [S82.899A] Cellulitis of left lower extremity [L03.116] Closed fracture of shaft of right tibia, unspecified fracture morphology, initial encounter [S82.201A] Patient Active Problem List   Diagnosis Date Noted   Ankle fracture 07/31/2021   Cellulitis 07/31/2021   Arthritis of left ankle 06/11/2021   Closed bimalleolar fracture of left ankle 05/06/2021   Closed left ankle fracture 05/28/2020   Moderate protein malnutrition (HCC)    Adult BMI 50.0-59.9 kg/sq m (HCC)    Normocytic anemia 04/21/2018   Diabetic foot infection (HCC) 04/21/2018   Mild renal insufficiency 04/21/2018   Diabetic infection of right foot [facility])    Preventative health care 04/14/2011   DYSMETABOLIC SYNDROME 12/08/2010   ULNAR NEUROPATHY, BILATERAL 10/01/2010   Insulin-requiring or dependent type  II diabetes mellitus (HCC) 09/11/2010   HYPERLIPIDEMIA 09/11/2010   Morbid (severe) obesity due to excess calories (HCC) 09/11/2010   TOBACCO USE 09/11/2010   Depression with anxiety 09/11/2010   Essential hypertension 09/11/2010   GERD 09/11/2010   PEPTIC ULCER DISEASE 09/11/2010   COLONIC POLYPS, HX OF 09/11/2010   Lumbosacral  spondylosis 02/27/2010   PCP:  Wilmer Floor., MD Pharmacy:   [facility] [phone] Rosalita Levan, [place] - [address] AT [facility] [address] [place] [address] Phone: [phone] Fax: [phone]  [id number] - [place], [place] - [address] [facility] [place] [address] Phone: [phone] Fax: [phone]     Social Determinants of Health (SDOH) Interventions    Readmission Risk Interventions No flowsheet data found.

## 2021-08-07 NOTE — Progress Notes (Signed)
[facility] PROGRESS NOTE    Progress Note  [name]  [id number] DOB: 08-25-1961 DOA: 07/31/2021 PCP: Helen Hashimoto., MD     Brief Narrative:   [name] is an 60 y.o. 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  Came into the ED again on 08/01/2019 for ankle pain white count of 13 hemoglobin of 12    Assessment/Plan:   Left ankle acute perihardware fracture: Surgery was consulted status post open reduction internal fixation of the left tibial shaft Currently on Dilaudid and oxycodone still complaining of pain but looks comfortable. Ambulation and anticoagulation per orthopedic surgery. Further management per Ortho. Physical therapy recommended skilled nursing facility.  Patient will require bariatric bed. Follow-up with orthopedic surgery in 2 weeks after discharge for repeat x-ray and wound check. Okay to shower keep dry dressing.  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 She was continued on Jardiance her blood glucose seems to be well controlled.  Nonobstructive CAD: Continue metoprolol, hydralazine and amlodipine.  Morbid obesity: Counseling. Estimated body mass index is 56.49 kg/m as calculated from the following:   Height as of this encounter: 5\' 6"  (1.676 m).   Weight as of this encounter: 158.8 kg.  Major depression: Continue bupropion, Cymbalta lurasidone and  Seroquel bedtime along with trazodone.  Chronic kidney disease stage IIIb: Creatinine at baseline.  Tobacco abuse: Not on oxygen continue inhalers.   DVT prophylaxis: lovneox Family Communication:none Status is: Inpatient  Remains inpatient appropriate because: Will need skilled nursing facility transfer to [facility] is on board.        Code Status:     Code Status Orders  (From admission, onward)           Start     Ordered   07/31/21 2124  Full code  Continuous        07/31/21 2125           Code Status History     Date Active Date Inactive Code Status Order ID Comments User Context   06/11/2021 1657 06/25/2021 2041 Full Code [id number]  Vivien Rota Inpatient   05/28/2020 1240 06/05/2020 0656 Full Code [id number]  Mercy Riding, MD ED   04/21/2018 1958 04/27/2018 2157 Full Code [id number]  Opyd, Ilene Qua, MD ED         IV Access:   Peripheral IV   Procedures and diagnostic studies:   No results found.   Medical Consultants:   None.   Subjective:    [name] relates her pain is controlled has not had a bowel movement tolerating her diet.  Objective:    Vitals:   08/06/21 1309 08/06/21 1613 08/06/21 2047 08/07/21 0742  BP: (!) 132/40 (!) 120/44 (!) 147/63 (!) 117/39  Pulse: 72 69 75 83  Resp: 16 16 18 17   Temp: 98.7 F (37.1 C) 97.8 F (36.6 C) 98.6 F (37 C) 98.1 F (36.7 C)  TempSrc: Oral Oral Oral Oral  SpO2: 91% 90% (!) 82% 94%  Weight:      Height:       SpO2: 94 % O2 Flow Rate (L/min): 1 L/min   Intake/Output Summary (Last 24 hours) at 08/07/2021 1159 Last data filed at 08/07/2021 0900 Gross per 24 hour  Intake 360 ml  Output 2900 ml  Net -2540 ml    Filed Weights   07/31/21 1622  Weight: (!) 158.8 kg    Exam: General exam: In no acute distress. Respiratory system: Good air movement and clear to auscultation. Cardiovascular system: S1 & S2 heard, RRR. No JVD. Gastrointestinal system: Abdomen is  nondistended, soft and nontender.  Extremities: No pedal edema. Skin: No rashes, lesions or ulcers Psychiatry: Judgement and insight appear normal. Mood & affect appropriate.    Data Reviewed:    Labs: Basic Metabolic Panel: Recent Labs  Lab 07/31/21 1754 08/02/21 0015 08/03/21 0129 08/04/21 0123  NA 133* 135 137 137  K 4.3 4.9 4.6 4.6  CL 98 101 105 105  CO2 24 26 24 26   GLUCOSE 83 227* 220* 241*  BUN 12 17 20 19   CREATININE 1.05* 1.26* 1.23* 1.17*  CALCIUM 8.8* 8.6* 8.5* 8.5*    GFR Estimated Creatinine Clearance: 81 mL/min (A) (by C-G formula based on SCr of 1.17 mg/dL (H)). Liver Function Tests: No results for input(s): AST, ALT, ALKPHOS, BILITOT, PROT, ALBUMIN in the last 168 hours. No results for input(s): LIPASE, AMYLASE in the last 168 hours. No results for input(s): AMMONIA in the last 168 hours. Coagulation profile No results for input(s): INR, PROTIME in the last 168 hours. COVID-19 Labs  No results for input(s): DDIMER, FERRITIN, LDH, CRP in the last 72 hours.  Lab Results  Component Value Date   SARSCOV2NAA NEGATIVE 07/31/2021   SARSCOV2NAA NEGATIVE 06/24/2021   SARSCOV2NAA NEGATIVE 06/20/2021   SARSCOV2NAA NEGATIVE 06/11/2021    CBC: Recent Labs  Lab 07/31/21 1754 08/01/21 0501 08/02/21 0015 08/03/21 0129 08/04/21 0123  WBC 13.2* 11.1* 9.5 8.8 7.2  NEUTROABS 9.6*  --   --   --  4.4  HGB 12.2 11.7* 10.9* 10.0* 9.7*  HCT 40.3 37.8 35.7* 33.2* 31.8*  MCV 93.5 91.3 90.8 92.7 92.7  PLT 270 257 243 234 206    Cardiac Enzymes: No results for input(s): CKTOTAL, CKMB, CKMBINDEX, TROPONINI in the last 168 hours. BNP (last 3 results) No results for input(s): PROBNP in the last 8760 hours. CBG: Recent Labs  Lab 08/06/21 1151 08/06/21 1611 08/06/21 2046 08/07/21 0640 08/07/21 1122  GLUCAP 115* 133* 138* 140* 126*    D-Dimer: No results for input(s): DDIMER in the last 72 hours. Hgb A1c: No results for input(s): HGBA1C in the last 72  hours. Lipid Profile: No results for input(s): CHOL, HDL, LDLCALC, TRIG, CHOLHDL, LDLDIRECT in the last 72 hours. Thyroid function studies: No results for input(s): TSH, T4TOTAL, T3FREE, THYROIDAB in the last 72 hours.  Invalid input(s): FREET3 Anemia work up: No results for input(s): VITAMINB12, FOLATE, FERRITIN, TIBC, IRON, RETICCTPCT in the last 72 hours. Sepsis Labs: Recent Labs  Lab 08/01/21 0501 08/02/21 0015 08/03/21 0129 08/04/21 0123  WBC 11.1* 9.5 8.8 7.2    Microbiology Recent Results (from the past 240 hour(s))  Resp Panel by RT-PCR (Flu A&B, Covid) Nasopharyngeal Swab     Status: None   Collection Time: 07/31/21  5:54 PM   Specimen: Nasopharyngeal Swab; Nasopharyngeal(NP) swabs in vial transport medium  Result Value Ref Range Status   SARS Coronavirus 2 by  RT PCR NEGATIVE NEGATIVE Final    Comment: (NOTE) SARS-CoV-2 target nucleic acids are NOT DETECTED.  The SARS-CoV-2 RNA is generally detectable in upper respiratory specimens during the acute phase of infection. The lowest concentration of SARS-CoV-2 viral copies this assay can detect is 138 copies/mL. A negative result does not preclude SARS-Cov-2 infection and should not be used as the sole basis for treatment or other patient management decisions. A negative result may occur with  improper specimen collection/handling, submission of specimen other than nasopharyngeal swab, presence of viral mutation(s) within the areas targeted by this assay, and inadequate number of viral copies(<138 copies/mL). A negative result must be combined with clinical observations, patient history, and epidemiological information. The expected result is Negative.  Fact Sheet for Patients:  EntrepreneurPulse.com.au  Fact Sheet for Healthcare Providers:  IncredibleEmployment.be  This test is no t yet approved or cleared by the [place] FDA and  has been authorized for detection and/or  diagnosis of SARS-CoV-2 by FDA under an Emergency Use Authorization (EUA). This EUA will remain  in effect (meaning this test can be used) for the duration of the COVID-19 declaration under Section 564(b)(1) of the Act, 21 U.S.C.section 360bbb-3(b)(1), unless the authorization is terminated  or revoked sooner.       Influenza A by PCR NEGATIVE NEGATIVE Final   Influenza B by PCR NEGATIVE NEGATIVE Final    Comment: (NOTE) The Xpert Xpress SARS-CoV-2/FLU/RSV plus assay is intended as an aid in the diagnosis of influenza from Nasopharyngeal swab specimens and should not be used as a sole basis for treatment. Nasal washings and aspirates are unacceptable for Xpert Xpress SARS-CoV-2/FLU/RSV testing.  Fact Sheet for Patients: EntrepreneurPulse.com.au  Fact Sheet for Healthcare Providers: IncredibleEmployment.be  This test is not yet approved or cleared by the [place] FDA and has been authorized for detection and/or diagnosis of SARS-CoV-2 by FDA under an Emergency Use Authorization (EUA). This EUA will remain in effect (meaning this test can be used) for the duration of the COVID-19 declaration under Section 564(b)(1) of the Act, 21 U.S.C. section 360bbb-3(b)(1), unless the authorization is terminated or revoked.  Performed at [facility], [place] [address] [place], [place] [address]      Medications:    acetaminophen  1,000 mg Oral Q6H   amLODipine  10 mg Oral Daily   amoxicillin-clavulanate  1 tablet Oral Q12H   atorvastatin  10 mg Oral Daily   buPROPion  200 mg Oral BID   docusate sodium  100 mg Oral BID   DULoxetine  90 mg Oral Daily   empagliflozin  25 mg Oral Daily   enoxaparin (LOVENOX) injection  40 mg Subcutaneous Q24H   hydrALAZINE  25 mg Oral TID   ibuprofen  600 mg Oral QID   insulin aspart  0-15 Units Subcutaneous TID WC   insulin aspart  0-5 Units Subcutaneous QHS   insulin aspart  3 Units Subcutaneous TID WC    insulin glargine-yfgn  14 Units Subcutaneous BID   lurasidone  40 mg Oral Daily   metoprolol tartrate  50 mg Oral BID   mometasone-formoterol  2 puff Inhalation BID   pantoprazole  40 mg Oral Daily   polyethylene glycol  17 g Oral Daily   pregabalin  225 mg Oral TID   QUEtiapine  100 mg Oral QHS   traZODone  200 mg Oral QHS   Continuous Infusions:  methocarbamol (ROBAXIN) IV        LOS: 7 days  Charlynne Cousins  [facility]  08/07/2021, 11:59 AM

## 2021-08-08 DIAGNOSIS — Z1159 Encounter for screening for other viral diseases: Secondary | ICD-10-CM | POA: Diagnosis not present

## 2021-08-08 DIAGNOSIS — K219 Gastro-esophageal reflux disease without esophagitis: Secondary | ICD-10-CM | POA: Diagnosis not present

## 2021-08-08 DIAGNOSIS — Z4889 Encounter for other specified surgical aftercare: Secondary | ICD-10-CM | POA: Diagnosis not present

## 2021-08-08 DIAGNOSIS — E1140 Type 2 diabetes mellitus with diabetic neuropathy, unspecified: Secondary | ICD-10-CM | POA: Diagnosis not present

## 2021-08-08 DIAGNOSIS — E1142 Type 2 diabetes mellitus with diabetic polyneuropathy: Secondary | ICD-10-CM | POA: Diagnosis not present

## 2021-08-08 DIAGNOSIS — R051 Acute cough: Secondary | ICD-10-CM | POA: Diagnosis not present

## 2021-08-08 DIAGNOSIS — Z743 Need for continuous supervision: Secondary | ICD-10-CM | POA: Diagnosis not present

## 2021-08-08 DIAGNOSIS — R262 Difficulty in walking, not elsewhere classified: Secondary | ICD-10-CM | POA: Diagnosis not present

## 2021-08-08 DIAGNOSIS — R1084 Generalized abdominal pain: Secondary | ICD-10-CM | POA: Diagnosis not present

## 2021-08-08 DIAGNOSIS — I10 Essential (primary) hypertension: Secondary | ICD-10-CM | POA: Diagnosis not present

## 2021-08-08 DIAGNOSIS — I5032 Chronic diastolic (congestive) heart failure: Secondary | ICD-10-CM | POA: Diagnosis not present

## 2021-08-08 DIAGNOSIS — I130 Hypertensive heart and chronic kidney disease with heart failure and stage 1 through stage 4 chronic kidney disease, or unspecified chronic kidney disease: Secondary | ICD-10-CM | POA: Diagnosis not present

## 2021-08-08 DIAGNOSIS — S82892A Other fracture of left lower leg, initial encounter for closed fracture: Secondary | ICD-10-CM | POA: Diagnosis not present

## 2021-08-08 DIAGNOSIS — L03116 Cellulitis of left lower limb: Secondary | ICD-10-CM

## 2021-08-08 DIAGNOSIS — I25119 Atherosclerotic heart disease of native coronary artery with unspecified angina pectoris: Secondary | ICD-10-CM | POA: Diagnosis not present

## 2021-08-08 DIAGNOSIS — J810 Acute pulmonary edema: Secondary | ICD-10-CM | POA: Diagnosis not present

## 2021-08-08 DIAGNOSIS — J449 Chronic obstructive pulmonary disease, unspecified: Secondary | ICD-10-CM | POA: Diagnosis not present

## 2021-08-08 DIAGNOSIS — E1165 Type 2 diabetes mellitus with hyperglycemia: Secondary | ICD-10-CM | POA: Diagnosis not present

## 2021-08-08 DIAGNOSIS — M19072 Primary osteoarthritis, left ankle and foot: Secondary | ICD-10-CM | POA: Diagnosis not present

## 2021-08-08 DIAGNOSIS — E46 Unspecified protein-calorie malnutrition: Secondary | ICD-10-CM | POA: Diagnosis not present

## 2021-08-08 DIAGNOSIS — Z8616 Personal history of COVID-19: Secondary | ICD-10-CM | POA: Diagnosis not present

## 2021-08-08 DIAGNOSIS — Z794 Long term (current) use of insulin: Secondary | ICD-10-CM | POA: Diagnosis not present

## 2021-08-08 DIAGNOSIS — R0902 Hypoxemia: Secondary | ICD-10-CM | POA: Diagnosis not present

## 2021-08-08 DIAGNOSIS — Z7401 Bed confinement status: Secondary | ICD-10-CM | POA: Diagnosis not present

## 2021-08-08 DIAGNOSIS — S82892D Other fracture of left lower leg, subsequent encounter for closed fracture with routine healing: Secondary | ICD-10-CM | POA: Diagnosis not present

## 2021-08-08 DIAGNOSIS — M6281 Muscle weakness (generalized): Secondary | ICD-10-CM | POA: Diagnosis not present

## 2021-08-08 DIAGNOSIS — G894 Chronic pain syndrome: Secondary | ICD-10-CM | POA: Diagnosis not present

## 2021-08-08 DIAGNOSIS — R6889 Other general symptoms and signs: Secondary | ICD-10-CM | POA: Diagnosis not present

## 2021-08-08 DIAGNOSIS — D508 Other iron deficiency anemias: Secondary | ICD-10-CM | POA: Diagnosis not present

## 2021-08-08 DIAGNOSIS — N1832 Chronic kidney disease, stage 3b: Secondary | ICD-10-CM | POA: Diagnosis not present

## 2021-08-08 DIAGNOSIS — S82222D Displaced transverse fracture of shaft of left tibia, subsequent encounter for closed fracture with routine healing: Secondary | ICD-10-CM | POA: Diagnosis not present

## 2021-08-08 DIAGNOSIS — S82232D Displaced oblique fracture of shaft of left tibia, subsequent encounter for closed fracture with routine healing: Secondary | ICD-10-CM | POA: Diagnosis not present

## 2021-08-08 DIAGNOSIS — L97821 Non-pressure chronic ulcer of other part of left lower leg limited to breakdown of skin: Secondary | ICD-10-CM | POA: Diagnosis not present

## 2021-08-08 LAB — GLUCOSE, CAPILLARY
Glucose-Capillary: 143 mg/dL — ABNORMAL HIGH (ref 70–99)
Glucose-Capillary: 169 mg/dL — ABNORMAL HIGH (ref 70–99)
Glucose-Capillary: 167 mg/dL — ABNORMAL HIGH (ref 70–99)
Glucose-Capillary: 198 mg/dL — ABNORMAL HIGH (ref 70–99)

## 2021-08-08 MED ORDER — PANTOPRAZOLE SODIUM 40 MG PO TBEC: 40.0000 mg | DELAYED_RELEASE_TABLET | Freq: Every day | ORAL | 1 refills | Status: AC

## 2021-08-08 MED ORDER — AMOXICILLIN-POT CLAVULANATE 875-125 MG PO TABS
1.0000 | ORAL_TABLET | Freq: Two times a day (BID) | ORAL | 0 refills | Status: AC
Start: 2021-08-08 — End: 2021-08-13

## 2021-08-08 MED ORDER — IBUPROFEN 600 MG PO TABS: 600.0000 mg | ORAL_TABLET | Freq: Four times a day (QID) | ORAL | 0 refills | Status: DC

## 2021-08-08 MED ORDER — ACETAMINOPHEN 500 MG PO TABS: 1000.0000 mg | ORAL_TABLET | Freq: Three times a day (TID) | ORAL | 0 refills | Status: AC

## 2021-08-08 MED ORDER — METHOCARBAMOL 750 MG PO TABS: 750.0000 mg | ORAL_TABLET | Freq: Four times a day (QID) | ORAL | 0 refills | Status: AC | PRN

## 2021-08-08 MED ORDER — ENOXAPARIN SODIUM 40 MG/0.4ML IJ SOSY: 40.0000 mg | PREFILLED_SYRINGE | INTRAMUSCULAR | 0 refills | Status: AC

## 2021-08-08 MED ORDER — OXYCODONE HCL 20 MG PO TABS: 20.0000 mg | ORAL_TABLET | ORAL | 0 refills | Status: AC | PRN

## 2021-08-08 NOTE — Progress Notes (Signed)
Physical Therapy Treatment Patient Details Name: [name] MRN: [id number] DOB: 08/24/1961 Today's Date: 08/08/2021   History of Present Illness The pt is a 60 yo female presenting 10/27 with L ankle pain after standing up on it last night and hearing a "crack". Of note, pt with L ankle fusion in sep 2022, re-fx on 10/18, and was d/c home where she was unable to maintain NWB status. Now s/p ORIF L tibial shaft 1028.  PMH includes: morbid obesity, CAD, CHF, DM II, HTN, MI, sleep apnea, COPD, CKD anxiety, depression, HLD, and anemia.    PT Comments    Patient progressing slowly towards PT goals. This session worked on lateral scoot transfers x2 requiring Min A of 2 for safety and assist. Pt able to maintain NWB for the most part with 1 instance of placing weight through LLE when performing second transfer likely due to fatigue. Pt reports she cannot hop and does not plan on doing so at rehab but wants to become independent with transfers. Reviewed there ex. Plans to d/c to rehab today. Will follow.    Recommendations for follow up therapy are one component of a multi-disciplinary discharge planning process, led by the attending physician.  Recommendations may be updated based on patient status, additional functional criteria and insurance authorization.  Follow Up Recommendations  Skilled nursing-short term rehab (<3 hours/day)     Assistance Recommended at Discharge Intermittent Supervision/Assistance  Equipment Recommendations  None recommended by PT    Recommendations for Other Services       Precautions / Restrictions Precautions Precautions: Fall Precaution Comments: repeated fx of L ankle due to inability to maintain NWB Restrictions Weight Bearing Restrictions: Yes LLE Weight Bearing: Non weight bearing     Mobility  Bed Mobility Overal bed mobility: Needs Assistance Bed Mobility: Supine to Sit     Supine to sit: Supervision;HOB elevated     General bed mobility  comments: increased time and use of rails    Transfers Overall transfer level: Needs assistance Equipment used: None Transfers: Lateral/Scoot Transfers            Lateral/Scoot Transfers: Min assist;+2 physical assistance;From elevated surface General transfer comment: Able to perform lateral scoot into chair towards right with multiple butt scoots, assist with weight shifting and using pad to help with bottom, performed x2 from bed to/from chair. Placing weight through left foot once on way back otherwise maintaining NWB status. Cues for technique and forward weight shift.    Ambulation/Gait                 Stairs             Wheelchair Mobility    Modified Rankin (Stroke Patients Only)       Balance Overall balance assessment: Needs assistance Sitting-balance support: No upper extremity supported;Feet supported Sitting balance-Leahy Scale: Fair                                      Cognition Arousal/Alertness: Awake/alert Behavior During Therapy: WFL for tasks assessed/performed Overall Cognitive Status: Within Functional Limits for tasks assessed                                 General Comments: Understands WB restrictions but has a hard time maintaining during mobility        Exercises  General Comments General comments (skin integrity, edema, etc.): Encouraged there ex, SLR throughout the day      Pertinent Vitals/Pain Pain Assessment: 0-10 Pain Score: 3  Pain Location: L LE Pain Descriptors / Indicators: Aching;Sore Pain Intervention(s): Monitored during session;Repositioned    Home Living                          Prior Function            PT Goals (current goals can now be found in the care plan section) Progress towards PT goals: Progressing toward goals (slowly)    Frequency    Min 3X/week      PT Plan Current plan remains appropriate    Co-evaluation PT/OT/SLP  Co-Evaluation/Treatment: Yes Reason for Co-Treatment: For patient/therapist safety;To address functional/ADL transfers PT goals addressed during session: Mobility/safety with mobility;Balance        AM-PAC PT "6 Clicks" Mobility   Outcome Measure  Help needed turning from your back to your side while in a flat bed without using bedrails?: A Little Help needed moving from lying on your back to sitting on the side of a flat bed without using bedrails?: A Little Help needed moving to and from a bed to a chair (including a wheelchair)?: A Lot Help needed standing up from a chair using your arms (e.g., wheelchair or bedside chair)?: Total Help needed to walk in hospital room?: Total Help needed climbing 3-5 steps with a railing? : Total 6 Click Score: 11    End of Session Equipment Utilized During Treatment: Gait belt Activity Tolerance: Patient tolerated treatment well Patient left: in bed;with call bell/phone within reach;with bed alarm set Nurse Communication: Mobility status PT Visit Diagnosis: Unsteadiness on feet (R26.81);Other abnormalities of gait and mobility (R26.89);Muscle weakness (generalized) (M62.81);Pain Pain - Right/Left: Left Pain - part of body: Ankle and joints of foot;Leg     Time: 1050-1116 PT Time Calculation (min) (ACUTE ONLY): 26 min  Charges:  $Therapeutic Activity: 8-22 mins                     Vale Haven, PT, DPT Acute Rehabilitation Services Pager [phone] Office [phone]      Blake Divine A Lanier Ensign 08/08/2021, 12:09 PM

## 2021-08-08 NOTE — Discharge Summary (Signed)
Physician Discharge Summary  [name] [id number] DOB: [birth year] DOA: 07/31/2021  PCP: Helen Hashimoto., MD  Admit date: 07/31/2021 Discharge date: 08/08/2021  Admitted From: home Disposition:  SNF  Recommendations for Outpatient Follow-up:  Follow up with PCP in 1-2 weeks Please obtain BMP/CBC in one week   Home Health:no Equipment/Devices:none  Discharge Condition:Stable CODE STATUS:Full Diet recommendation: Heart Healthy   Brief/Interim Summary:  60 y.o. 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  Came into the ED again on 08/01/2019 for ankle pain white count of 13 hemoglobin of 12  Discharge Diagnoses:  Principal Problem:   Ankle fracture Active Problems:   Depression with anxiety   Essential hypertension   GERD   Cellulitis  Left ankle acute perihardware fracture: Orthopedic surgery was consulted perform an open reduction with internal fixation of the left tibial shaft. She was placed on narcotics initially was uncomfortable. Then transition to oral analgesics. Orthopedic recommended Lovenox for 2 weeks as an outpatient for DVT prophylaxis. He also recommended to continue Augmentin for 5 additional days. Physical therapy evaluated the patient and recommended skilled nursing facility.  Insulin-dependent diabetes mellitus type 2: No changes made to her medication continue long-acting insulin plus sliding scale and Jardiance.  Nonobstructive coronary artery  disease:  Continue current regimen no changes made.  Essential hypertension: Continue metoprolol hydralazine and amlodipine.  Major depressive disorder: Change made to her medication continue current regimen.  Chronic kidney disease stage IIIb: Creatinine at baseline no changes made. Morbid obesity: With a BMI of 56-she has been counseled.    Discharge Instructions  Discharge Instructions     Diet - low sodium heart healthy   Complete by: As directed    Discharge wound care:   Complete by: As directed    Per Ortho instructions.   Increase activity slowly   Complete by: As directed       Allergies as of 08/08/2021       Reactions   Ceftriaxone Diarrhea, Nausea And Vomiting        Medication List     STOP taking these medications    meloxicam 7.5 MG tablet Commonly known as: MOBIC   oxyCODONE-acetaminophen 10-325 MG tablet Commonly known as: PERCOCET       TAKE these medications    acetaminophen 500 MG tablet Commonly known as: TYLENOL Take 2 tablets (1,000 mg total) by mouth every 8 (eight) hours. What changed:  when to take this reasons to take this   amLODipine 10 MG tablet Commonly known as: NORVASC Take 10 mg by mouth daily.   amoxicillin-clavulanate 875-125 MG tablet Commonly known as: AUGMENTIN Take 1 tablet by mouth every 12 (twelve) hours for 5 days.   atorvastatin 10 MG tablet Commonly known as: LIPITOR Take 10 mg by mouth daily.   bisacodyl 10 MG suppository Commonly known as: DULCOLAX Place 1 suppository (10 mg total) rectally daily as needed for moderate constipation.   buPROPion 200 MG 12 hr tablet Commonly known as: WELLBUTRIN SR Take 200 mg by mouth 2 (two)  times daily.   DULoxetine 60 MG capsule Commonly known as: CYMBALTA Take 60 mg by mouth daily.   DULoxetine 30 MG capsule Commonly known as: CYMBALTA Take 30 mg by mouth daily.   enoxaparin 40 MG/0.4ML injection Commonly known as: LOVENOX Inject 0.4 mLs (40 mg  total) into the skin daily. What changed:  medication strength how much to take   hydrALAZINE 25 MG tablet Commonly known as: APRESOLINE Take 25 mg by mouth 3 (three) times daily.   insulin glargine 100 UNIT/ML injection Commonly known as: LANTUS Inject 0.6 mLs (60 Units total) into the skin 2 (two) times daily. What changed: how much to take   Jardiance 25 MG Tabs tablet Generic drug: empagliflozin Take 25 mg by mouth daily.   lurasidone 40 MG Tabs tablet Commonly known as: LATUDA Take 40 mg by mouth daily.   Lyrica 225 MG capsule Generic drug: pregabalin Take 225 mg by mouth 3 (three) times daily.   methocarbamol 750 MG tablet Commonly known as: ROBAXIN Take 1 tablet (750 mg total) by mouth every 6 (six) hours as needed for muscle spasms.   metoprolol tartrate 50 MG tablet Commonly known as: LOPRESSOR Take 50 mg by mouth 2 (two) times daily.   MULTI-DAY VITAMINS PO Take 1 tablet by mouth daily.   omeprazole 40 MG capsule Commonly known as: PRILOSEC Take 40 mg by mouth daily.   Oxycodone HCl 20 MG Tabs Take 1 tablet (20 mg total) by mouth every 4 (four) hours as needed for moderate pain or severe pain. What changed:  medication strength how much to take when to take this reasons to take this Another medication with the same name was removed. Continue taking this medication, and follow the directions you see here.   Ozempic (1 MG/DOSE) 2 MG/1.5ML Sopn Generic drug: Semaglutide (1 MG/DOSE) Inject 1 mg into the skin every 7 (seven) days.   pantoprazole 40 MG tablet Commonly known as: Protonix Take 1 tablet (40 mg total) by mouth daily.   polyethylene glycol 17 g packet Commonly known as: MIRALAX / GLYCOLAX Take 17 g by mouth daily as needed for mild constipation.   QUEtiapine 100 MG tablet Commonly known as: SEROQUEL Take 100 mg by mouth at bedtime.   Symbicort 80-4.5 MCG/ACT inhaler Generic drug: budesonide-formoterol Inhale 2 puffs into the lungs 2  (two) times daily as needed (shortness of breath or wheezing).   traZODone 100 MG tablet Commonly known as: DESYREL Take 200 mg by mouth at bedtime.               Discharge Care Instructions  (From admission, onward)           Start     Ordered   08/08/21 0000  Discharge wound care:       Comments: Per Ortho instructions.   08/08/21 1009            Follow-up Information     Haddix, Gillie Manners, MD. Schedule an appointment as soon as possible for a visit in 2 week(s).   Specialty: Orthopedic Surgery Why: for wound check, suture removal, and repeat x-rays Contact information: [address] [place] [address] [phone]                Allergies  Allergen Reactions   Ceftriaxone Diarrhea and Nausea And Vomiting    Consultations: Orthopedic surgery   Procedures/Studies: DG Tibia/Fibula Left  Result Date: 08/01/2021 CLINICAL DATA:  Intraoperative fluoroscopic images EXAM: LEFT TIBIA AND FIBULA -  2 VIEW COMPARISON:  None. FINDINGS: Multiple intraoperative fluoroscopic images for left tibia open reduction internal fixation. Total fluoroscopic time of 1 minute and 50 seconds. IMPRESSION: Intraoperative use of fluoroscopy Electronically Signed   By: Keane Police D.O.   On: 08/01/2021 13:18   DG Tibia/Fibula Left  Result Date: 07/31/2021 CLINICAL DATA:  Fracture EXAM: LEFT TIBIA AND FIBULA - 2 VIEW COMPARISON:  07/31/2021 earlier today FINDINGS: Distal tibial intramedullary nail again noted extending through the talus, calcaneus and into the tibia. Oblique fracture noted in the distal tibial shaft just above the intramedullary nail. This is unchanged since prior study. Prior resection of distal fibula. IMPRESSION: No significant change in the alignment across the oblique fracture of the distal tibial shaft just above the intramedullary nail. Electronically Signed   By: Rolm Baptise M.D.   On: 07/31/2021 19:00   DG Tibia/Fibula Left  Result Date:  07/22/2021 CLINICAL DATA:  Pain EXAM: LEFT TIBIA AND FIBULA - 2 VIEW; LEFT FOOT - COMPLETE 3+ VIEW COMPARISON:  06/11/2021 ankle radiographs FINDINGS: Redemonstrated intramedullary nail extending from the tibia through the posterior talus and calcaneus, with proximal and distal locking screws, in overall unchanged alignment and configuration compared to 06/11/2021. No perihardware lucency. Hardware appears intact. Redemonstrated resection of the distal fibula. Osteopenia. No acute fracture or dislocation in the foot, tibia, or fibula. Degenerative changes most prominent in the midfoot. Tricompartmental joint space narrowing in the knee. IMPRESSION: No acute fracture or dislocation. Unchanged appearance distal tibial/ankle hardware. No evidence of hardware failure. Electronically Signed   By: Merilyn Baba M.D.   On: 07/22/2021 01:39   DG Ankle Complete Left  Result Date: 07/31/2021 CLINICAL DATA:  Ankle injury. EXAM: LEFT ANKLE COMPLETE - 3+ VIEW COMPARISON:  Left ankle x-ray 06/11/2021. FINDINGS: Overlying cast obscures fine bony detail. There is a distal tibial intramedullary nail extending into the talus and calcaneus with multiple screws. There is new acute oblique fracture at the proximal tip of the intramedullary nail. Fracture fragments are distracted 3 mm. The hardware appears intact. Distal fibular resection appears unchanged. There is no gross dislocation. IMPRESSION: 1. Acute fracture of the distal tibia at the level of the proximal intramedullary nail. 2. Hardware otherwise appears unchanged. Electronically Signed   By: Ronney Asters M.D.   On: 07/31/2021 17:24   CT TIBIA FIBULA LEFT WO CONTRAST  Result Date: 07/31/2021 CLINICAL DATA:  Evaluate left tibial fracture EXAM: CT OF THE LOWER LEFT EXTREMITY WITHOUT CONTRAST TECHNIQUE: Multidetector CT imaging of the lower left extremity was performed according to the standard protocol. COMPARISON:  X-ray 07/31/2021, 07/22/2021.  CT 01/09/2021  FINDINGS: Bones/Joint/Cartilage Postsurgical changes from tibiotalocalcaneal fusion with retrograde nail and screw hardware. Acute perihardware fracture centered at the tip of the nail within the distal tibial diaphysis. There is minimal lateral displacement at the fracture site. A nondisplaced oblique fracture component extends distally to the level of the distal metadiaphysis (series 13, image 55). Extensive postsurgical changes with evidence of prior hardware removal in the distal tibia and fibula with chronic fracture deformities. There is mild lucency surrounding the nail and screws, particularly within the calcaneus compatible with loosening. Marked bony fragmentation at the tibiotalar joint with collapse of the talar dome. The lateral malleolus has been resected. There is numerous densities within the resection bed likely bone cement or spacers. Advanced midfoot arthropathy is similar in appearance to prior. Ligaments Suboptimally assessed by CT. Muscles and Tendons Fatty atrophy of the lower leg musculature. Tendinous structures are not well  evaluated, but appear grossly intact. Soft tissues Circumferential soft tissue swelling about the lower leg and foot. Prominent soft tissue thickening surrounds the tibiotalar joint. No organized fluid collection. No soft tissue gas. IMPRESSION: 1. Postsurgical changes from tibiotalocalcaneal fusion with retrograde nail and screw hardware. Acute perihardware fracture centered at the tip of the nail within the distal tibial diaphysis with minimal lateral displacement at the fracture site. A nondisplaced oblique fracture component extends distally to the level of the distal metadiaphysis. 2. Extensive postsurgical changes with evidence of prior hardware removal in the distal tibia and fibula with chronic fracture deformities. Marked bony fragmentation at the tibiotalar joint with collapse of the talar dome. Prominent soft tissue thickening surrounding the tibiotalar joint,  which could reflect fibrotic changes secondary to surgery or possibly related to underlying infection. 3. Circumferential soft tissue swelling about the lower leg and foot without organized fluid collection or soft tissue gas. Electronically Signed   By: Davina Poke D.O.   On: 07/31/2021 20:04   DG CHEST PORT 1 VIEW  Result Date: 07/31/2021 CLINICAL DATA:  Cough EXAM: PORTABLE CHEST 1 VIEW COMPARISON:  05/28/2020 FINDINGS: Heart is borderline in size. No confluent opacities, effusions or edema. No acute bony abnormality. IMPRESSION: No acute cardiopulmonary disease. Electronically Signed   By: Rolm Baptise M.D.   On: 07/31/2021 23:05   DG Tibia/Fibula Left Port  Result Date: 08/01/2021 CLINICAL DATA:  Postop. EXAM: PORTABLE LEFT TIBIA AND FIBULA - 2 VIEW COMPARISON:  Preoperative imaging, including radiographs yesterday. FINDINGS: Previous ankle fusion hardware with intramedullary nail traversing the calcaneus, talus and tibia. There is a new lateral plate and multi screw fixation of periprosthetic fracture. Previous distal fibular resection is again seen. Recent postsurgical change includes soft tissue edema. IMPRESSION: New lateral plate and screw fixation of periprosthetic fracture. No immediate postoperative complication. Electronically Signed   By: Keith Rake M.D.   On: 08/01/2021 14:41   DG Foot Complete Left  Result Date: 07/22/2021 CLINICAL DATA:  Pain EXAM: LEFT TIBIA AND FIBULA - 2 VIEW; LEFT FOOT - COMPLETE 3+ VIEW COMPARISON:  06/11/2021 ankle radiographs FINDINGS: Redemonstrated intramedullary nail extending from the tibia through the posterior talus and calcaneus, with proximal and distal locking screws, in overall unchanged alignment and configuration compared to 06/11/2021. No perihardware lucency. Hardware appears intact. Redemonstrated resection of the distal fibula. Osteopenia. No acute fracture or dislocation in the foot, tibia, or fibula. Degenerative changes most  prominent in the midfoot. Tricompartmental joint space narrowing in the knee. IMPRESSION: No acute fracture or dislocation. Unchanged appearance distal tibial/ankle hardware. No evidence of hardware failure. Electronically Signed   By: Merilyn Baba M.D.   On: 07/22/2021 01:39   DG C-Arm 1-60 Min-No Report  Result Date: 08/01/2021 Fluoroscopy was utilized by the requesting physician.  No radiographic interpretation.   DG C-Arm 1-60 Min-No Report  Result Date: 08/01/2021 Fluoroscopy was utilized by the requesting physician.  No radiographic interpretation.   ECHOCARDIOGRAM COMPLETE  Result Date: 08/02/2021    ECHOCARDIOGRAM REPORT   Patient Name:   [name] Date of Exam: 08/02/2021 Medical Rec #:  [id number]     Height:       66.0 in Accession #:    [id number]    Weight:       350.0 lb Date of Birth:  Jul 12, 1961    BSA:          2.537 m Patient Age:    3 years      BP:  122/55 mmHg Patient Gender: F             HR:           72 bpm. Exam Location:  Inpatient Procedure: 2D Echo, Cardiac Doppler and Color Doppler Indications:     Chest pain  History:         Patient has no prior history of Echocardiogram examinations.                  Signs/Symptoms:Shortness of Breath; Risk Factors:Morbid                  obesity.  Sonographer:     Merrie Roof RDCS Referring Phys:  [id number] Shela Leff Diagnosing Phys: Dixie Dials MD IMPRESSIONS  1. Left ventricular ejection fraction, by estimation, is 60 to 65%. The left ventricle has normal function. The left ventricle has no regional wall motion abnormalities. There is mild concentric left ventricular hypertrophy. Left ventricular diastolic parameters are consistent with Grade II diastolic dysfunction (pseudonormalization).  2. Right ventricular systolic function is normal. The right ventricular size is normal. There is normal pulmonary artery systolic pressure.  3. Left atrial size was moderately dilated.  4. The mitral valve is normal in  structure. Mild mitral valve regurgitation.  5. The aortic valve is tricuspid. Aortic valve regurgitation is not visualized.  6. The inferior vena cava is dilated in size with <50% respiratory variability, suggesting right atrial pressure of 15 mmHg. FINDINGS  Left Ventricle: Left ventricular ejection fraction, by estimation, is 60 to 65%. The left ventricle has normal function. The left ventricle has no regional wall motion abnormalities. The left ventricular internal cavity size was normal in size. There is  mild concentric left ventricular hypertrophy. Left ventricular diastolic parameters are consistent with Grade II diastolic dysfunction (pseudonormalization). Right Ventricle: The right ventricular size is normal. No increase in right ventricular wall thickness. Right ventricular systolic function is normal. There is normal pulmonary artery systolic pressure. The tricuspid regurgitant velocity is 1.83 m/s, and  with an assumed right atrial pressure of 8 mmHg, the estimated right ventricular systolic pressure is 99991111 mmHg. Left Atrium: Left atrial size was moderately dilated. Right Atrium: Right atrial size was normal in size. Pericardium: There is no evidence of pericardial effusion. Mitral Valve: The mitral valve is normal in structure. Mild mitral valve regurgitation. Tricuspid Valve: The tricuspid valve is normal in structure. Tricuspid valve regurgitation is mild. Aortic Valve: The aortic valve is tricuspid. Aortic valve regurgitation is not visualized. Aortic valve mean gradient measures 5.0 mmHg. Aortic valve peak gradient measures 9.6 mmHg. Aortic valve area, by VTI measures 2.22 cm. Pulmonic Valve: The pulmonic valve was normal in structure. Pulmonic valve regurgitation is not visualized. Aorta: The aortic root is normal in size and structure. There is minimal (Grade I) atheroma plaque involving the aortic root and ascending aorta. Venous: The inferior vena cava is dilated in size with less than 50%  respiratory variability, suggesting right atrial pressure of 15 mmHg. IAS/Shunts: The atrial septum is grossly normal.  LEFT VENTRICLE PLAX 2D LVIDd:         5.20 cm   Diastology LVIDs:         3.20 cm   LV e' medial:    6.45 cm/s LV PW:         1.20 cm   LV E/e' medial:  17.5 LV IVS:        1.20 cm   LV e' lateral:   10.60 cm/s  LVOT diam:     1.90 cm   LV E/e' lateral: 10.7 LV SV:         64 LV SV Index:   25 LVOT Area:     2.84 cm  RIGHT VENTRICLE          IVC RV Basal diam:  3.40 cm  IVC diam: 2.20 cm LEFT ATRIUM             Index        RIGHT ATRIUM           Index LA diam:        4.30 cm 1.70 cm/m   RA Area:     21.10 cm LA Vol (A2C):   91.0 ml 35.87 ml/m  RA Volume:   63.50 ml  25.03 ml/m LA Vol (A4C):   75.4 ml 29.72 ml/m LA Biplane Vol: 83.5 ml 32.92 ml/m  AORTIC VALVE AV Area (Vmax):    2.21 cm AV Area (Vmean):   2.32 cm AV Area (VTI):     2.22 cm AV Vmax:           155.00 cm/s AV Vmean:          102.000 cm/s AV VTI:            0.289 m AV Peak Grad:      9.6 mmHg AV Mean Grad:      5.0 mmHg LVOT Vmax:         121.00 cm/s LVOT Vmean:        83.300 cm/s LVOT VTI:          0.226 m LVOT/AV VTI ratio: 0.78  AORTA Ao Root diam: 3.10 cm Ao Asc diam:  2.80 cm MITRAL VALVE                TRICUSPID VALVE MV Area (PHT): 4.29 cm     TR Peak grad:   13.4 mmHg MV Decel Time: 177 msec     TR Vmax:        183.00 cm/s MV E velocity: 113.00 cm/s MV A velocity: 70.40 cm/s   SHUNTS MV E/A ratio:  1.61         Systemic VTI:  0.23 m                             Systemic Diam: 1.90 cm Dixie Dials MD Electronically signed by Dixie Dials MD Signature Date/Time: 08/02/2021/3:53:59 PM    Final    VAS [place] LOWER EXTREMITY VENOUS (DVT) (ONLY [facility] & [facility])  Result Date: 08/01/2021  Lower Venous DVT Study Patient Name:  [name]  Date of Exam:   07/31/2021 Medical Rec #: [id number]      Accession #:    [id number] Date of Birth: 01-02-1961     Patient Gender: F Patient Age:   15 years Exam Location:  [facility]  Procedure:      VAS [place] LOWER EXTREMITY VENOUS (DVT) Referring Phys: ABIGAIL HARRIS --------------------------------------------------------------------------------  Indications: New trauma to left ankle s/p ORIF.  Limitations: Body habitus. Comparison       07-22-2021 Bilateral lower extremity venous was negative for Study:           DVT. Performing Technologist: Darlin Coco RDMS, RVT  Examination Guidelines: A complete evaluation includes B-mode imaging, spectral Doppler, color Doppler, and power Doppler as needed of all accessible portions of each vessel. Bilateral testing is considered an  integral part of a complete examination. Limited examinations for reoccurring indications may be performed as noted. The reflux portion of the exam is performed with the patient in reverse Trendelenburg.  +---------+---------------+---------+-----------+----------+--------------+ LEFT     CompressibilityPhasicitySpontaneityPropertiesThrombus Aging +---------+---------------+---------+-----------+----------+--------------+ CFV      Full           Yes      Yes                                 +---------+---------------+---------+-----------+----------+--------------+ SFJ      Full                                                        +---------+---------------+---------+-----------+----------+--------------+ FV Prox  Full                                                        +---------+---------------+---------+-----------+----------+--------------+ FV Mid   Full           Yes      Yes                                 +---------+---------------+---------+-----------+----------+--------------+ FV Distal               Yes      Yes                                 +---------+---------------+---------+-----------+----------+--------------+ PFV      Full                                                        +---------+---------------+---------+-----------+----------+--------------+ POP       Full           Yes      Yes                                 +---------+---------------+---------+-----------+----------+--------------+ PTV                     Yes      Yes                                 +---------+---------------+---------+-----------+----------+--------------+ PERO                    Yes      Yes                                 +---------+---------------+---------+-----------+----------+--------------+     Summary: LEFT: - There is no evidence of deep vein thrombosis in the lower extremity. However, portions of this examination were limited- see  technologist comments above.  - No cystic structure found in the popliteal fossa.  *See table(s) above for measurements and observations. Electronically signed by Jamelle Haring on 08/01/2021 at 4:56:55 PM.    Final    VAS [place] LOWER EXTREMITY VENOUS (DVT) (7a-7p)  Result Date: 07/22/2021  Lower Venous DVT Study Patient Name:  [name]  Date of Exam:   07/22/2021 Medical Rec #: [id number]      Accession #:    [id number] Date of Birth: 15-Nov-1960     Patient Gender: F Patient Age:   64 years Exam Location:  [facility] Procedure:      VAS [place] LOWER EXTREMITY VENOUS (DVT) Referring Phys: Collier Salina MESSICK --------------------------------------------------------------------------------  Indications: Edema.  Limitations: Body habitus and poor ultrasound/tissue interface. Comparison Study: no prior Performing Technologist: Archie Patten RVS  Examination Guidelines: A complete evaluation includes B-mode imaging, spectral Doppler, color Doppler, and power Doppler as needed of all accessible portions of each vessel. Bilateral testing is considered an integral part of a complete examination. Limited examinations for reoccurring indications may be performed as noted. The reflux portion of the exam is performed with the patient in reverse Trendelenburg.   +---------+---------------+---------+-----------+----------+-------------------+ RIGHT    CompressibilityPhasicitySpontaneityPropertiesThrombus Aging      +---------+---------------+---------+-----------+----------+-------------------+ CFV      Full           Yes      Yes                                      +---------+---------------+---------+-----------+----------+-------------------+ SFJ      Full                                                             +---------+---------------+---------+-----------+----------+-------------------+ FV Prox  Full                                                             +---------+---------------+---------+-----------+----------+-------------------+ FV Mid                  Yes      Yes                                      +---------+---------------+---------+-----------+----------+-------------------+ FV Distal               Yes      Yes                                      +---------+---------------+---------+-----------+----------+-------------------+ PFV      Full                                                             +---------+---------------+---------+-----------+----------+-------------------+  POP      Full           Yes      Yes                                      +---------+---------------+---------+-----------+----------+-------------------+ PTV                                                   patent by color     +---------+---------------+---------+-----------+----------+-------------------+ PERO                                                  Not well visualized +---------+---------------+---------+-----------+----------+-------------------+   +---------+---------------+---------+-----------+----------+-------------------+ LEFT     CompressibilityPhasicitySpontaneityPropertiesThrombus Aging      +---------+---------------+---------+-----------+----------+-------------------+  CFV      Full           Yes      Yes                                      +---------+---------------+---------+-----------+----------+-------------------+ SFJ      Full                                                             +---------+---------------+---------+-----------+----------+-------------------+ FV Prox  Full                                                             +---------+---------------+---------+-----------+----------+-------------------+ FV Mid                  Yes      Yes                                      +---------+---------------+---------+-----------+----------+-------------------+ FV Distal               Yes      Yes                                      +---------+---------------+---------+-----------+----------+-------------------+ PFV      Full                                                             +---------+---------------+---------+-----------+----------+-------------------+ POP      Full           Yes  Yes                                      +---------+---------------+---------+-----------+----------+-------------------+ PTV                                                   patent by color     +---------+---------------+---------+-----------+----------+-------------------+ PERO                                                  Not well visualized +---------+---------------+---------+-----------+----------+-------------------+     Summary: RIGHT: - There is no evidence of deep vein thrombosis in the lower extremity. However, portions of this examination were limited- see technologist comments above.  - No cystic structure found in the popliteal fossa.  LEFT: - There is no evidence of deep vein thrombosis in the lower extremity. However, portions of this examination were limited- see technologist comments above.  - No cystic structure found in the popliteal fossa.  *See table(s) above for measurements and  observations. Electronically signed by Deitra Mayo MD on 07/22/2021 at 5:00:00 PM.    Final      Subjective: No complaints feels great  Discharge Exam: Vitals:   08/08/21 0807 08/08/21 0845  BP: (!) 136/53   Pulse: 78   Resp: 18   Temp: 98.2 F (36.8 C)   SpO2: 92% 92%   Vitals:   08/07/21 2106 08/08/21 0644 08/08/21 0807 08/08/21 0845  BP:  (!) 157/54 (!) 136/53   Pulse:  68 78   Resp:   18   Temp:  98.2 F (36.8 C) 98.2 F (36.8 C)   TempSrc:      SpO2: 91% 95% 92% 92%  Weight:      Height:        General: Pt is alert, awake, not in acute distress Cardiovascular: RRR, S1/S2 +, no rubs, no gallops Respiratory: CTA bilaterally, no wheezing, no rhonchi Abdominal: Soft, NT, ND, bowel sounds + Extremities: no edema, no cyanosis    The results of significant diagnostics from this hospitalization (including imaging, microbiology, ancillary and laboratory) are listed below for reference.     Microbiology: Recent Results (from the past 240 hour(s))  Resp Panel by RT-PCR (Flu A&B, Covid) Nasopharyngeal Swab     Status: None   Collection Time: 07/31/21  5:54 PM   Specimen: Nasopharyngeal Swab; Nasopharyngeal(NP) swabs in vial transport medium  Result Value Ref Range Status   SARS Coronavirus 2 by RT PCR NEGATIVE NEGATIVE Final    Comment: (NOTE) SARS-CoV-2 target nucleic acids are NOT DETECTED.  The SARS-CoV-2 RNA is generally detectable in upper respiratory specimens during the acute phase of infection. The lowest concentration of SARS-CoV-2 viral copies this assay can detect is 138 copies/mL. A negative result does not preclude SARS-Cov-2 infection and should not be used as the sole basis for treatment or other patient management decisions. A negative result may occur with  improper specimen collection/handling, submission of specimen other than nasopharyngeal swab, presence of viral mutation(s) within the areas targeted by this assay, and inadequate number  of viral copies(<138 copies/mL). A negative result must be combined with  clinical observations, patient history, and epidemiological information. The expected result is Negative.  Fact Sheet for Patients:  EntrepreneurPulse.com.au  Fact Sheet for Healthcare Providers:  IncredibleEmployment.be  This test is no t yet approved or cleared by the [place] FDA and  has been authorized for detection and/or diagnosis of SARS-CoV-2 by FDA under an Emergency Use Authorization (EUA). This EUA will remain  in effect (meaning this test can be used) for the duration of the COVID-19 declaration under Section 564(b)(1) of the Act, 21 U.S.C.section 360bbb-3(b)(1), unless the authorization is terminated  or revoked sooner.       Influenza A by PCR NEGATIVE NEGATIVE Final   Influenza B by PCR NEGATIVE NEGATIVE Final    Comment: (NOTE) The Xpert Xpress SARS-CoV-2/FLU/RSV plus assay is intended as an aid in the diagnosis of influenza from Nasopharyngeal swab specimens and should not be used as a sole basis for treatment. Nasal washings and aspirates are unacceptable for Xpert Xpress SARS-CoV-2/FLU/RSV testing.  Fact Sheet for Patients: EntrepreneurPulse.com.au  Fact Sheet for Healthcare Providers: IncredibleEmployment.be  This test is not yet approved or cleared by the [place] FDA and has been authorized for detection and/or diagnosis of SARS-CoV-2 by FDA under an Emergency Use Authorization (EUA). This EUA will remain in effect (meaning this test can be used) for the duration of the COVID-19 declaration under Section 564(b)(1) of the Act, 21 U.S.C. section 360bbb-3(b)(1), unless the authorization is terminated or revoked.  Performed at [facility], [place] [address], [place], [place] [address]   Resp Panel by RT-PCR (Flu A&B, Covid) Nasopharyngeal Swab     Status: None   Collection Time: 08/07/21 10:50 AM    Specimen: Nasopharyngeal Swab; Nasopharyngeal(NP) swabs in vial transport medium  Result Value Ref Range Status   SARS Coronavirus 2 by RT PCR NEGATIVE NEGATIVE Final    Comment: (NOTE) SARS-CoV-2 target nucleic acids are NOT DETECTED.  The SARS-CoV-2 RNA is generally detectable in upper respiratory specimens during the acute phase of infection. The lowest concentration of SARS-CoV-2 viral copies this assay can detect is 138 copies/mL. A negative result does not preclude SARS-Cov-2 infection and should not be used as the sole basis for treatment or other patient management decisions. A negative result may occur with  improper specimen collection/handling, submission of specimen other than nasopharyngeal swab, presence of viral mutation(s) within the areas targeted by this assay, and inadequate number of viral copies(<138 copies/mL). A negative result must be combined with clinical observations, patient history, and epidemiological information. The expected result is Negative.  Fact Sheet for Patients:  EntrepreneurPulse.com.au  Fact Sheet for Healthcare Providers:  IncredibleEmployment.be  This test is no t yet approved or cleared by the [place] FDA and  has been authorized for detection and/or diagnosis of SARS-CoV-2 by FDA under an Emergency Use Authorization (EUA). This EUA will remain  in effect (meaning this test can be used) for the duration of the COVID-19 declaration under Section 564(b)(1) of the Act, 21 U.S.C.section 360bbb-3(b)(1), unless the authorization is terminated  or revoked sooner.       Influenza A by PCR NEGATIVE NEGATIVE Final   Influenza B by PCR NEGATIVE NEGATIVE Final    Comment: (NOTE) The Xpert Xpress SARS-CoV-2/FLU/RSV plus assay is intended as an aid in the diagnosis of influenza from Nasopharyngeal swab specimens and should not be used as a sole basis for treatment. Nasal washings and aspirates are  unacceptable for Xpert Xpress SARS-CoV-2/FLU/RSV testing.  Fact Sheet for Patients: EntrepreneurPulse.com.au  Fact Sheet for Healthcare Providers: IncredibleEmployment.be  This test is not yet approved or cleared by the [place] FDA and has been authorized for detection and/or diagnosis of SARS-CoV-2 by FDA under an Emergency Use Authorization (EUA). This EUA will remain in effect (meaning this test can be used) for the duration of the COVID-19 declaration under Section 564(b)(1) of the Act, 21 U.S.C. section 360bbb-3(b)(1), unless the authorization is terminated or revoked.  Performed at [facility], [place] [address], [place], [place] [address]      Labs: BNP (last 3 results) Recent Labs    08/01/21 0512  BNP [id number]   Basic Metabolic Panel: Recent Labs  Lab 08/02/21 0015 08/03/21 0129 08/04/21 0123  NA 135 137 137  K 4.9 4.6 4.6  CL 101 105 105  CO2 26 24 26   GLUCOSE 227* 220* 241*  BUN 17 20 19   CREATININE 1.26* 1.23* 1.17*  CALCIUM 8.6* 8.5* 8.5*   Liver Function Tests: No results for input(s): AST, ALT, ALKPHOS, BILITOT, PROT, ALBUMIN in the last 168 hours. No results for input(s): LIPASE, AMYLASE in the last 168 hours. No results for input(s): AMMONIA in the last 168 hours. CBC: Recent Labs  Lab 08/02/21 0015 08/03/21 0129 08/04/21 0123  WBC 9.5 8.8 7.2  NEUTROABS  --   --  4.4  HGB 10.9* 10.0* 9.7*  HCT 35.7* 33.2* 31.8*  MCV 90.8 92.7 92.7  PLT 243 234 206   Cardiac Enzymes: No results for input(s): CKTOTAL, CKMB, CKMBINDEX, TROPONINI in the last 168 hours. BNP: Invalid input(s): POCBNP CBG: Recent Labs  Lab 08/07/21 0640 08/07/21 1122 08/07/21 1608 08/07/21 1952 08/08/21 0749  GLUCAP 140* 126* 202* 123* 143*   D-Dimer No results for input(s): DDIMER in the last 72 hours. Hgb A1c No results for input(s): HGBA1C in the last 72 hours. Lipid Profile No results for input(s): CHOL, HDL,  LDLCALC, TRIG, CHOLHDL, LDLDIRECT in the last 72 hours. Thyroid function studies No results for input(s): TSH, T4TOTAL, T3FREE, THYROIDAB in the last 72 hours.  Invalid input(s): FREET3 Anemia work up No results for input(s): VITAMINB12, FOLATE, FERRITIN, TIBC, IRON, RETICCTPCT in the last 72 hours. Urinalysis    Component Value Date/Time   COLORURINE YELLOW 10/08/2011 0746   APPEARANCEUR CLEAR 10/08/2011 0746   LABSPEC 1.038 (H) 10/08/2011 0746   PHURINE 5.5 10/08/2011 0746   GLUCOSEU >1000 (A) 10/08/2011 0746   GLUCOSEU >=1000 03/11/2011 1504   HGBUR SMALL (A) 10/08/2011 0746   BILIRUBINUR MODERATE (A) 10/08/2011 0746   KETONESUR >80 (A) 10/08/2011 0746   PROTEINUR NEGATIVE 10/08/2011 0746   UROBILINOGEN 0.2 10/08/2011 0746   NITRITE NEGATIVE 10/08/2011 0746   LEUKOCYTESUR NEGATIVE 10/08/2011 0746   Sepsis Labs Invalid input(s): PROCALCITONIN,  WBC,  LACTICIDVEN Microbiology Recent Results (from the past 240 hour(s))  Resp Panel by RT-PCR (Flu A&B, Covid) Nasopharyngeal Swab     Status: None   Collection Time: 07/31/21  5:54 PM   Specimen: Nasopharyngeal Swab; Nasopharyngeal(NP) swabs in vial transport medium  Result Value Ref Range Status   SARS Coronavirus 2 by RT PCR NEGATIVE NEGATIVE Final    Comment: (NOTE) SARS-CoV-2 target nucleic acids are NOT DETECTED.  The SARS-CoV-2 RNA is generally detectable in upper respiratory specimens during the acute phase of infection. The lowest concentration of SARS-CoV-2 viral copies this assay can detect is 138 copies/mL. A negative result does not preclude SARS-Cov-2 infection and should not be used as the sole basis for treatment or other patient management  decisions. A negative result may occur with  improper specimen collection/handling, submission of specimen other than nasopharyngeal swab, presence of viral mutation(s) within the areas targeted by this assay, and inadequate number of viral copies(<138 copies/mL). A negative  result must be combined with clinical observations, patient history, and epidemiological information. The expected result is Negative.  Fact Sheet for Patients:  EntrepreneurPulse.com.au  Fact Sheet for Healthcare Providers:  IncredibleEmployment.be  This test is no t yet approved or cleared by the [place] FDA and  has been authorized for detection and/or diagnosis of SARS-CoV-2 by FDA under an Emergency Use Authorization (EUA). This EUA will remain  in effect (meaning this test can be used) for the duration of the COVID-19 declaration under Section 564(b)(1) of the Act, 21 U.S.C.section 360bbb-3(b)(1), unless the authorization is terminated  or revoked sooner.       Influenza A by PCR NEGATIVE NEGATIVE Final   Influenza B by PCR NEGATIVE NEGATIVE Final    Comment: (NOTE) The Xpert Xpress SARS-CoV-2/FLU/RSV plus assay is intended as an aid in the diagnosis of influenza from Nasopharyngeal swab specimens and should not be used as a sole basis for treatment. Nasal washings and aspirates are unacceptable for Xpert Xpress SARS-CoV-2/FLU/RSV testing.  Fact Sheet for Patients: EntrepreneurPulse.com.au  Fact Sheet for Healthcare Providers: IncredibleEmployment.be  This test is not yet approved or cleared by the [place] FDA and has been authorized for detection and/or diagnosis of SARS-CoV-2 by FDA under an Emergency Use Authorization (EUA). This EUA will remain in effect (meaning this test can be used) for the duration of the COVID-19 declaration under Section 564(b)(1) of the Act, 21 U.S.C. section 360bbb-3(b)(1), unless the authorization is terminated or revoked.  Performed at [facility], [place] [address], [place], [place] [address]   Resp Panel by RT-PCR (Flu A&B, Covid) Nasopharyngeal Swab     Status: None   Collection Time: 08/07/21 10:50 AM   Specimen: Nasopharyngeal Swab;  Nasopharyngeal(NP) swabs in vial transport medium  Result Value Ref Range Status   SARS Coronavirus 2 by RT PCR NEGATIVE NEGATIVE Final    Comment: (NOTE) SARS-CoV-2 target nucleic acids are NOT DETECTED.  The SARS-CoV-2 RNA is generally detectable in upper respiratory specimens during the acute phase of infection. The lowest concentration of SARS-CoV-2 viral copies this assay can detect is 138 copies/mL. A negative result does not preclude SARS-Cov-2 infection and should not be used as the sole basis for treatment or other patient management decisions. A negative result may occur with  improper specimen collection/handling, submission of specimen other than nasopharyngeal swab, presence of viral mutation(s) within the areas targeted by this assay, and inadequate number of viral copies(<138 copies/mL). A negative result must be combined with clinical observations, patient history, and epidemiological information. The expected result is Negative.  Fact Sheet for Patients:  EntrepreneurPulse.com.au  Fact Sheet for Healthcare Providers:  IncredibleEmployment.be  This test is no t yet approved or cleared by the [place] FDA and  has been authorized for detection and/or diagnosis of SARS-CoV-2 by FDA under an Emergency Use Authorization (EUA). This EUA will remain  in effect (meaning this test can be used) for the duration of the COVID-19 declaration under Section 564(b)(1) of the Act, 21 U.S.C.section 360bbb-3(b)(1), unless the authorization is terminated  or revoked sooner.       Influenza A by PCR NEGATIVE NEGATIVE Final   Influenza B by PCR NEGATIVE NEGATIVE Final    Comment: (NOTE) The Xpert Xpress SARS-CoV-2/FLU/RSV plus  assay is intended as an aid in the diagnosis of influenza from Nasopharyngeal swab specimens and should not be used as a sole basis for treatment. Nasal washings and aspirates are unacceptable for Xpert Xpress  SARS-CoV-2/FLU/RSV testing.  Fact Sheet for Patients: EntrepreneurPulse.com.au  Fact Sheet for Healthcare Providers: IncredibleEmployment.be  This test is not yet approved or cleared by the [place] FDA and has been authorized for detection and/or diagnosis of SARS-CoV-2 by FDA under an Emergency Use Authorization (EUA). This EUA will remain in effect (meaning this test can be used) for the duration of the COVID-19 declaration under Section 564(b)(1) of the Act, 21 U.S.C. section 360bbb-3(b)(1), unless the authorization is terminated or revoked.  Performed at [facility], [place] [address], [place], [place] [address]      SIGNED:   Charlynne Cousins, MD  [facility] 08/08/2021, 10:10 AM Pager   If 7PM-7AM, please contact night-coverage [email or web address] Password [email or web address]

## 2021-08-08 NOTE — TOC Transition Note (Signed)
Transition of Care [facility]) - CM/SW Discharge Note   Patient Details  Name: [name] MRN: [id number] Date of Birth: March 19, 1961  [facility] CM/SW Contact:  Ralene Bathe, LCSWA Phone Number: 08/08/2021, 1:22 PM   Clinical Narrative:     Patient will DC to: [facility], [place] Anticipated DC date: 07/26/2021 Transport by: Sharin Mons   Per MD patient ready for DC to SNF. RN to call report prior to discharge [phone]. RN, patient, patient's and facility notified of DC. Discharge Summary and FL2 sent to facility. DC packet on chart. Ambulance transport requested for patient.   CSW will sign off for now as social work intervention is no longer needed. Please consult [place] again if new needs arise.      Barriers to Discharge: SNF Pending bed offer, Continued Medical Work up, English as a second language teacher   Patient Goals and CMS Choice Patient states their goals for this hospitalization and ongoing recovery are:: To be able to return home CMS Medicare.gov Compare Post Acute Care list provided to:: Patient Choice offered to / list presented to : Patient  Discharge Placement                       Discharge Plan and Services                                     Social Determinants of Health (SDOH) Interventions     Readmission Risk Interventions No flowsheet data found.

## 2021-08-08 NOTE — Discharge Instructions (Signed)
[facility] Discharge Instructions   General Discharge Instructions  WEIGHT BEARING STATUS:Non-weightbearing left lower extremity  RANGE OF MOTION/ACTIVITY: Ok for knee motion as tolerated.   Wound Care: Incisions can be left open to air if there is no drainage. If incision continues to have drainage, follow wound care instructions below. Okay to shower if no drainage from incisions.  DVT/PE prophylaxis: Lovenox  Diet: as you were eating previously.  Can use over the counter stool softeners and bowel preparations, such as Miralax, to help with bowel movements.  Narcotics can be constipating.  Be sure to drink plenty of fluids  PAIN MEDICATION USE AND EXPECTATIONS  You have likely been given narcotic medications to help control your pain.  After a traumatic event that results in an fracture (broken bone) with or without surgery, it is ok to use narcotic pain medications to help control one's pain.  We understand that everyone responds to pain differently and each individual patient will be evaluated on a regular basis for the continued need for narcotic medications. Ideally, narcotic medication use should last no more than 6-8 weeks (coinciding with fracture healing).   As a patient it is your responsibility as well to monitor narcotic medication use and report the amount and frequency you use these medications when you come to your office visit.   We would also advise that if you are using narcotic medications, you should take a dose prior to therapy to maximize you participation.  IF YOU ARE ON NARCOTIC MEDICATIONS IT IS NOT PERMISSIBLE TO OPERATE A MOTOR VEHICLE (MOTORCYCLE/CAR/TRUCK/MOPED) OR HEAVY MACHINERY DO NOT MIX NARCOTICS WITH OTHER CNS (CENTRAL NERVOUS SYSTEM) DEPRESSANTS SUCH AS ALCOHOL   STOP SMOKING OR USING NICOTINE PRODUCTS!!!!  As discussed nicotine severely impairs your body's ability to heal surgical and traumatic wounds but also impairs bone healing.   Wounds and bone heal by forming microscopic blood vessels (angiogenesis) and nicotine is a vasoconstrictor (essentially, shrinks blood vessels).  Therefore, if vasoconstriction occurs to these microscopic blood vessels they essentially disappear and are unable to deliver necessary nutrients to the healing tissue.  This is one modifiable factor that you can do to dramatically increase your chances of healing your injury.    (This means no smoking, no nicotine gum, patches, etc)  DO NOT USE NONSTEROIDAL ANTI-INFLAMMATORY DRUGS (NSAID'S)  Using products such as Advil (ibuprofen), Aleve (naproxen), Motrin (ibuprofen) for additional pain control during fracture healing can delay and/or prevent the healing response.  If you would like to take over the counter (OTC) medication, Tylenol (acetaminophen) is ok.  However, some narcotic medications that are given for pain control contain acetaminophen as well. Therefore, you should not exceed more than 4000 mg of tylenol in a day if you do not have liver disease.  Also note that there are may OTC medicines, such as cold medicines and allergy medicines that my contain tylenol as well.  If you have any questions about medications and/or interactions please ask your doctor/PA or your pharmacist.      ICE AND ELEVATE INJURED/OPERATIVE EXTREMITY  Using ice and elevating the injured extremity above your heart can help with swelling and pain control.  Icing in a pulsatile fashion, such as 20 minutes on and 20 minutes off, can be followed.    Do not place ice directly on skin. Make sure there is a barrier between to skin and the ice pack.    Using frozen items such as frozen peas works well as the conform  nicely to the are that needs to be iced.  USE AN ACE WRAP OR TED HOSE FOR SWELLING CONTROL  In addition to icing and elevation, Ace wraps or TED hose are used to help limit and resolve swelling.  It is recommended to use Ace wraps or TED hose until you are informed to  stop.    When using Ace Wraps start the wrapping distally (farthest away from the body) and wrap proximally (closer to the body)   Example: If you had surgery on your leg or thing and you do not have a splint on, start the ace wrap at the toes and work your way up to the thigh        If you had surgery on your upper extremity and do not have a splint on, start the ace wrap at your fingers and work your way up to the upper arm   CALL THE OFFICE WITH ANY QUESTIONS OR CONCERNS: [phone]   VISIT OUR WEBSITE FOR ADDITIONAL INFORMATION: [email or web address]     Discharge Wound Care Instructions  Do NOT apply any ointments, solutions or lotions to pin sites or surgical wounds.  These prevent needed drainage and even though solutions like hydrogen peroxide kill bacteria, they also damage cells lining the pin sites that help fight infection.  Applying lotions or ointments can keep the wounds moist and can cause them to breakdown and open up as well. This can increase the risk for infection. When in doubt call the office.  Surgical incisions should be dressed daily.  If any drainage is noted, use one layer of adaptic, then gauze, Kerlix, and an ace wrap.  Once the incision is completely dry and without drainage, it may be left open to air out.  Showering may begin 36-48 hours later.  Cleaning gently with soap and water.  Traumatic wounds should be dressed daily as well.    One layer of adaptic, gauze, Kerlix, then ace wrap.  The adaptic can be discontinued once the draining has ceased    If you have a wet to dry dressing: wet the gauze with saline the squeeze as much saline out so the gauze is moist (not soaking wet), place moistened gauze over wound, then place a dry gauze over the moist one, followed by Kerlix wrap, then ace wrap.

## 2021-08-08 NOTE — Progress Notes (Signed)
Orthopaedic Trauma Progress Note  SUBJECTIVE: Patient doing okay today.  Pain is manageable currently.  Is very concerned about pressure sore on left heel. Wound care nurse consulted on 08/05/21. Patient unsure if she was seen and if she was, does not remember what the wound care plan is for the heel.  I have reviewed wound care note, with recommendation for daily wound care including washing area with soap and water and application of foam dressing.   OBJECTIVE:  Vitals:   08/07/21 2106 08/08/21 0644  BP:  (!) 157/54  Pulse:  68  Resp:    Temp:  98.2 F (36.8 C)  SpO2: 91% 95%    General: Sitting up in bed, NAD Respiratory: No increased work of breathing.  Left lower extremity: Mepilex dressings are clean, dry, intact.  Swelling about lower leg improving. Small pressure ulcer on heel.  No active drainage.  No fluctuance to the area.  Have floated heels to help offload pressure. Significant neuropathy at baseline.   Foot warm and well-perfused. Limited ankle motion due to previous ankle fusion  IMAGING: Stable post op imaging.   LABS:  Results for orders placed or performed during the hospital encounter of 07/31/21 (from the past 24 hour(s))  Resp Panel by RT-PCR (Flu A&B, Covid) Nasopharyngeal Swab     Status: None   Collection Time: 08/07/21 10:50 AM   Specimen: Nasopharyngeal Swab; Nasopharyngeal(NP) swabs in vial transport medium  Result Value Ref Range   SARS Coronavirus 2 by RT PCR NEGATIVE NEGATIVE   Influenza A by PCR NEGATIVE NEGATIVE   Influenza B by PCR NEGATIVE NEGATIVE  Glucose, capillary     Status: Abnormal   Collection Time: 08/07/21 11:22 AM  Result Value Ref Range   Glucose-Capillary 126 (H) 70 - 99 mg/dL  Glucose, capillary     Status: Abnormal   Collection Time: 08/07/21  4:08 PM  Result Value Ref Range   Glucose-Capillary 202 (H) 70 - 99 mg/dL  Glucose, capillary     Status: Abnormal   Collection Time: 08/07/21  7:52 PM  Result Value Ref Range    Glucose-Capillary 123 (H) 70 - 99 mg/dL  Glucose, capillary     Status: Abnormal   Collection Time: 08/08/21  7:49 AM  Result Value Ref Range   Glucose-Capillary 143 (H) 70 - 99 mg/dL    ASSESSMENT: [name] is a 60 y.o. female, 7 Days Post-Op s/p ORIF PERIPROSTHETIC TIBIA FRACTURE  CV/Blood loss: Hgb stable. Hemodynamically stable  PLAN: Weightbearing: NWB LLE Incisional and dressing care: Ok to leave open to air. Change mepilex as needed Showering: ok to shower, keep dressing dry Orthopedic device(s): None  Pain management:  1. Tylenol 1000 mg q 6 hours scheduled 2. Robaxin 750 mg q 6 hours PRN 3. Oxycodone 20 mg q 3 hours PRN 4. Lyrica 225 mg TID 5. Dilaudid 1-2 mg q 4 hours PRN 6. Ibuprofen 600 mg QID  VTE prophylaxis: Lovenox, SCDs ID:  Ancef 2gm post op completed Foley/Lines:  No foley, KVO IVFs Impediments to Fracture Healing: Diabetes Dispo:  - Therapies as tolerated, PT/OT recommending SNF. TOC following and planning for discharge to [facility] today. - Appreciate wound care recommendations for left heel ulcer.   - Okay for discharge from ortho standpoint once cleared by medicine team and therapies - Have signed and placed discharge Rx for pain medication and DVT prophylaxis in patient's chart  Follow - up plan: 2 weeks after discharge for repeat x-rays and  wound check  Contact information:  Truitt Merle MD, Ulyses Southward PA-C. After hours and holidays please check [email or web address] for group call information for [facility]   Nyasiah Moffet A. Michaelyn Barter, PA-C [phone] (office) [email or web address]

## 2021-08-08 NOTE — Progress Notes (Signed)
Occupational Therapy Treatment Patient Details Name: [name] MRN: [id number] DOB: [birth year] Today's Date: 08/08/2021   History of present illness The pt is a 60 yo female presenting 10/27 with L ankle pain after standing up on it last night and hearing a "crack". Of note, pt with L ankle fusion in sep 2022, re-fx on 10/18, and was d/c home where she was unable to maintain NWB status. Now s/p ORIF L tibial shaft 1028.  PMH includes: morbid obesity, CAD, CHF, DM II, HTN, MI, sleep apnea, COPD, CKD anxiety, depression, HLD, and anemia.   OT comments  Patient made gains with functional transfers on this date with lateral scoot transfer to drop arm recliner with min assist of 2 for safety and assist. Patient stated she felt safe with transfer and was able to keep NWB on LLE except on one instance.  Patient plans to d/c to rehab today. Acute OT to continue to follow.    Recommendations for follow up therapy are one component of a multi-disciplinary discharge planning process, led by the attending physician.  Recommendations may be updated based on patient status, additional functional criteria and insurance authorization.    Follow Up Recommendations  Skilled nursing-short term rehab (<3 hours/day)    Assistance Recommended at Discharge Frequent or constant Supervision/Assistance  Equipment Recommendations  Hospital bed;BSC    Recommendations for Other Services      Precautions / Restrictions Precautions Precautions: Fall Precaution Comments: repeated fx of L ankle due to inability to maintain NWB Restrictions Weight Bearing Restrictions: Yes LLE Weight Bearing: Non weight bearing       Mobility Bed Mobility Overal bed mobility: Needs Assistance Bed Mobility: Supine to Sit     Supine to sit: Supervision;HOB elevated     General bed mobility comments: increased time and use of rails    Transfers Overall transfer level: Needs assistance Equipment used: None Transfers:  Lateral/Scoot Transfers            Lateral/Scoot Transfers: Min assist;+2 physical assistance;From elevated surface General transfer comment: lateral coot transfer to drop arm recliner from eob and back with asistance of one using pads and assitance of other to assist with body mechanics and safety     Balance Overall balance assessment: Needs assistance Sitting-balance support: No upper extremity supported;Feet supported Sitting balance-Leahy Scale: Fair                                     ADL either performed or assessed with clinical judgement   ADL                                               Vision       Perception     Praxis      Cognition Arousal/Alertness: Awake/alert Behavior During Therapy: WFL for tasks assessed/performed Overall Cognitive Status: Within Functional Limits for tasks assessed                                 General Comments: aware of WB precautions          Exercises     Shoulder Instructions       General Comments Encouraged there ex, SLR throughout the day  Pertinent Vitals/ Pain       Pain Assessment: 0-10 Pain Score: 3  Pain Location: L LE Pain Descriptors / Indicators: Aching;Sore Pain Intervention(s): Monitored during session;Repositioned  Home Living                                          Prior Functioning/Environment              Frequency  Min 2X/week        Progress Toward Goals  OT Goals(current goals can now be found in the care plan section)  Progress towards OT goals: Progressing toward goals  Acute Rehab OT Goals Patient Stated Goal: go to rehab OT Goal Formulation: With patient Time For Goal Achievement: 08/16/21 Potential to Achieve Goals: Good ADL Goals Pt Will Perform Grooming: with supervision;sitting Pt Will Perform Upper Body Bathing: with supervision;sitting Pt Will Perform Upper Body Dressing: with  supervision;sitting Pt Will Perform Lower Body Dressing: with min assist;sitting/lateral leans;with adaptive equipment Pt Will Transfer to Toilet: stand pivot transfer;bedside commode;with min assist Pt Will Perform Toileting - Clothing Manipulation and hygiene: with mod assist;sit to/from stand  Plan Discharge plan remains appropriate    Co-evaluation    PT/OT/SLP Co-Evaluation/Treatment: Yes Reason for Co-Treatment: For patient/therapist safety;To address functional/ADL transfers PT goals addressed during session: Mobility/safety with mobility;Balance OT goals addressed during session: Other (comment) (functional transfers)      AM-PAC OT "6 Clicks" Daily Activity     Outcome Measure   Help from another person eating meals?: None Help from another person taking care of personal grooming?: None Help from another person toileting, which includes using toliet, bedpan, or urinal?: A Lot Help from another person bathing (including washing, rinsing, drying)?: A Lot Help from another person to put on and taking off regular upper body clothing?: A Little Help from another person to put on and taking off regular lower body clothing?: A Lot 6 Click Score: 17    End of Session Equipment Utilized During Treatment: Gait belt  OT Visit Diagnosis: Unsteadiness on feet (R26.81);Muscle weakness (generalized) (M62.81);Pain Pain - Right/Left: Left Pain - part of body: Ankle and joints of foot   Activity Tolerance Patient tolerated treatment well   Patient Left in bed;with call bell/phone within reach   Nurse Communication Mobility status        Time: 8250-0370 OT Time Calculation (min): 26 min  Charges: OT General Charges $OT Visit: 1 Visit OT Treatments $Therapeutic Activity: 8-22 mins  [name], OTA Acute Rehabilitation Services  Pager [phone] Office [phone]   [name] 08/08/2021, 12:32 PM

## 2021-08-09 NOTE — Progress Notes (Signed)
Report received and care assumed from previous shift RN. VS obtained, shift assessments completed - see flowsheets. PRN pain medications given as needed - see MAR. Repositions self in bed. Discharge orders in place and paperwork/report called by previous shift RN. PTAR on unit to transport to discharge facility. IV removed from R forearm, gauze and tape to site, no s/s of infection noted. Discharged in stable condition via stretcher with belongings.

## 2021-08-09 NOTE — Plan of Care (Signed)

## 2021-08-11 DIAGNOSIS — G894 Chronic pain syndrome: Secondary | ICD-10-CM | POA: Diagnosis not present

## 2021-08-11 DIAGNOSIS — R1084 Generalized abdominal pain: Secondary | ICD-10-CM | POA: Diagnosis not present

## 2021-08-11 DIAGNOSIS — Z4889 Encounter for other specified surgical aftercare: Secondary | ICD-10-CM | POA: Diagnosis not present

## 2021-08-11 DIAGNOSIS — Z794 Long term (current) use of insulin: Secondary | ICD-10-CM | POA: Diagnosis not present

## 2021-08-11 DIAGNOSIS — K219 Gastro-esophageal reflux disease without esophagitis: Secondary | ICD-10-CM | POA: Diagnosis not present

## 2021-08-11 DIAGNOSIS — E1140 Type 2 diabetes mellitus with diabetic neuropathy, unspecified: Secondary | ICD-10-CM | POA: Diagnosis not present

## 2021-08-11 DIAGNOSIS — L97821 Non-pressure chronic ulcer of other part of left lower leg limited to breakdown of skin: Secondary | ICD-10-CM | POA: Diagnosis not present

## 2021-08-14 DIAGNOSIS — E1142 Type 2 diabetes mellitus with diabetic polyneuropathy: Secondary | ICD-10-CM | POA: Diagnosis not present

## 2021-08-14 DIAGNOSIS — S82892D Other fracture of left lower leg, subsequent encounter for closed fracture with routine healing: Secondary | ICD-10-CM | POA: Diagnosis not present

## 2021-08-14 DIAGNOSIS — I10 Essential (primary) hypertension: Secondary | ICD-10-CM | POA: Diagnosis not present

## 2021-08-18 DIAGNOSIS — L97821 Non-pressure chronic ulcer of other part of left lower leg limited to breakdown of skin: Secondary | ICD-10-CM | POA: Diagnosis not present

## 2021-08-18 DIAGNOSIS — E1140 Type 2 diabetes mellitus with diabetic neuropathy, unspecified: Secondary | ICD-10-CM | POA: Diagnosis not present

## 2021-08-19 DIAGNOSIS — G894 Chronic pain syndrome: Secondary | ICD-10-CM | POA: Diagnosis not present

## 2021-08-19 DIAGNOSIS — S82232D Displaced oblique fracture of shaft of left tibia, subsequent encounter for closed fracture with routine healing: Secondary | ICD-10-CM | POA: Diagnosis not present

## 2021-08-22 DIAGNOSIS — J449 Chronic obstructive pulmonary disease, unspecified: Secondary | ICD-10-CM | POA: Diagnosis not present

## 2021-08-25 DIAGNOSIS — E1140 Type 2 diabetes mellitus with diabetic neuropathy, unspecified: Secondary | ICD-10-CM | POA: Diagnosis not present

## 2021-08-25 DIAGNOSIS — E46 Unspecified protein-calorie malnutrition: Secondary | ICD-10-CM | POA: Diagnosis not present

## 2021-08-25 DIAGNOSIS — L97821 Non-pressure chronic ulcer of other part of left lower leg limited to breakdown of skin: Secondary | ICD-10-CM | POA: Diagnosis not present

## 2021-08-26 DIAGNOSIS — S82222D Displaced transverse fracture of shaft of left tibia, subsequent encounter for closed fracture with routine healing: Secondary | ICD-10-CM | POA: Diagnosis not present

## 2021-08-27 DIAGNOSIS — M6281 Muscle weakness (generalized): Secondary | ICD-10-CM | POA: Diagnosis not present

## 2021-08-27 DIAGNOSIS — S82232D Displaced oblique fracture of shaft of left tibia, subsequent encounter for closed fracture with routine healing: Secondary | ICD-10-CM | POA: Diagnosis not present

## 2021-08-27 DIAGNOSIS — G894 Chronic pain syndrome: Secondary | ICD-10-CM | POA: Diagnosis not present

## 2021-08-27 DIAGNOSIS — J449 Chronic obstructive pulmonary disease, unspecified: Secondary | ICD-10-CM | POA: Diagnosis not present

## 2021-09-01 DIAGNOSIS — M6281 Muscle weakness (generalized): Secondary | ICD-10-CM | POA: Diagnosis not present

## 2021-09-01 DIAGNOSIS — Z1159 Encounter for screening for other viral diseases: Secondary | ICD-10-CM | POA: Diagnosis not present

## 2021-09-01 DIAGNOSIS — S82232D Displaced oblique fracture of shaft of left tibia, subsequent encounter for closed fracture with routine healing: Secondary | ICD-10-CM | POA: Diagnosis not present

## 2021-09-01 DIAGNOSIS — R262 Difficulty in walking, not elsewhere classified: Secondary | ICD-10-CM | POA: Diagnosis not present

## 2021-09-02 DIAGNOSIS — R262 Difficulty in walking, not elsewhere classified: Secondary | ICD-10-CM | POA: Diagnosis not present

## 2021-09-02 DIAGNOSIS — R300 Dysuria: Secondary | ICD-10-CM | POA: Diagnosis not present

## 2021-09-02 DIAGNOSIS — R8290 Unspecified abnormal findings in urine: Secondary | ICD-10-CM | POA: Diagnosis not present

## 2021-09-02 DIAGNOSIS — N390 Urinary tract infection, site not specified: Secondary | ICD-10-CM | POA: Diagnosis not present

## 2021-09-02 DIAGNOSIS — S82232D Displaced oblique fracture of shaft of left tibia, subsequent encounter for closed fracture with routine healing: Secondary | ICD-10-CM | POA: Diagnosis not present

## 2021-09-02 DIAGNOSIS — M6281 Muscle weakness (generalized): Secondary | ICD-10-CM | POA: Diagnosis not present

## 2021-09-02 DIAGNOSIS — Z1159 Encounter for screening for other viral diseases: Secondary | ICD-10-CM | POA: Diagnosis not present

## 2021-09-02 DIAGNOSIS — R5381 Other malaise: Secondary | ICD-10-CM | POA: Diagnosis not present

## 2021-09-03 DIAGNOSIS — R262 Difficulty in walking, not elsewhere classified: Secondary | ICD-10-CM | POA: Diagnosis not present

## 2021-09-03 DIAGNOSIS — M6281 Muscle weakness (generalized): Secondary | ICD-10-CM | POA: Diagnosis not present

## 2021-09-03 DIAGNOSIS — Z1159 Encounter for screening for other viral diseases: Secondary | ICD-10-CM | POA: Diagnosis not present

## 2021-09-03 DIAGNOSIS — S82232D Displaced oblique fracture of shaft of left tibia, subsequent encounter for closed fracture with routine healing: Secondary | ICD-10-CM | POA: Diagnosis not present

## 2021-09-04 DIAGNOSIS — E1165 Type 2 diabetes mellitus with hyperglycemia: Secondary | ICD-10-CM | POA: Diagnosis not present

## 2021-09-04 DIAGNOSIS — Z4889 Encounter for other specified surgical aftercare: Secondary | ICD-10-CM | POA: Diagnosis not present

## 2021-09-04 DIAGNOSIS — R262 Difficulty in walking, not elsewhere classified: Secondary | ICD-10-CM | POA: Diagnosis not present

## 2021-09-04 DIAGNOSIS — D508 Other iron deficiency anemias: Secondary | ICD-10-CM | POA: Diagnosis not present

## 2021-09-04 DIAGNOSIS — R051 Acute cough: Secondary | ICD-10-CM | POA: Diagnosis not present

## 2021-09-04 DIAGNOSIS — S82222D Displaced transverse fracture of shaft of left tibia, subsequent encounter for closed fracture with routine healing: Secondary | ICD-10-CM | POA: Diagnosis not present

## 2021-09-04 DIAGNOSIS — E46 Unspecified protein-calorie malnutrition: Secondary | ICD-10-CM | POA: Diagnosis not present

## 2021-09-04 DIAGNOSIS — L97821 Non-pressure chronic ulcer of other part of left lower leg limited to breakdown of skin: Secondary | ICD-10-CM | POA: Diagnosis not present

## 2021-09-04 DIAGNOSIS — I25119 Atherosclerotic heart disease of native coronary artery with unspecified angina pectoris: Secondary | ICD-10-CM | POA: Diagnosis not present

## 2021-09-04 DIAGNOSIS — Z1159 Encounter for screening for other viral diseases: Secondary | ICD-10-CM | POA: Diagnosis not present

## 2021-09-04 DIAGNOSIS — J449 Chronic obstructive pulmonary disease, unspecified: Secondary | ICD-10-CM | POA: Diagnosis not present

## 2021-09-04 DIAGNOSIS — M6281 Muscle weakness (generalized): Secondary | ICD-10-CM | POA: Diagnosis not present

## 2021-09-04 DIAGNOSIS — J810 Acute pulmonary edema: Secondary | ICD-10-CM | POA: Diagnosis not present

## 2021-09-04 DIAGNOSIS — E1140 Type 2 diabetes mellitus with diabetic neuropathy, unspecified: Secondary | ICD-10-CM | POA: Diagnosis not present

## 2021-09-04 DIAGNOSIS — R300 Dysuria: Secondary | ICD-10-CM | POA: Diagnosis not present

## 2021-09-04 DIAGNOSIS — N390 Urinary tract infection, site not specified: Secondary | ICD-10-CM | POA: Diagnosis not present

## 2021-09-04 DIAGNOSIS — S82232D Displaced oblique fracture of shaft of left tibia, subsequent encounter for closed fracture with routine healing: Secondary | ICD-10-CM | POA: Diagnosis not present

## 2021-09-04 DIAGNOSIS — Z794 Long term (current) use of insulin: Secondary | ICD-10-CM | POA: Diagnosis not present

## 2021-09-04 DIAGNOSIS — N1832 Chronic kidney disease, stage 3b: Secondary | ICD-10-CM | POA: Diagnosis not present

## 2021-09-04 DIAGNOSIS — I130 Hypertensive heart and chronic kidney disease with heart failure and stage 1 through stage 4 chronic kidney disease, or unspecified chronic kidney disease: Secondary | ICD-10-CM | POA: Diagnosis not present

## 2021-09-04 DIAGNOSIS — I5032 Chronic diastolic (congestive) heart failure: Secondary | ICD-10-CM | POA: Diagnosis not present

## 2021-09-04 DIAGNOSIS — Z8616 Personal history of COVID-19: Secondary | ICD-10-CM | POA: Diagnosis not present

## 2021-09-04 DIAGNOSIS — M19072 Primary osteoarthritis, left ankle and foot: Secondary | ICD-10-CM | POA: Diagnosis not present

## 2021-09-04 DIAGNOSIS — F5101 Primary insomnia: Secondary | ICD-10-CM | POA: Diagnosis not present

## 2021-09-05 DIAGNOSIS — R262 Difficulty in walking, not elsewhere classified: Secondary | ICD-10-CM | POA: Diagnosis not present

## 2021-09-05 DIAGNOSIS — Z1159 Encounter for screening for other viral diseases: Secondary | ICD-10-CM | POA: Diagnosis not present

## 2021-09-05 DIAGNOSIS — S82232D Displaced oblique fracture of shaft of left tibia, subsequent encounter for closed fracture with routine healing: Secondary | ICD-10-CM | POA: Diagnosis not present

## 2021-09-05 DIAGNOSIS — N390 Urinary tract infection, site not specified: Secondary | ICD-10-CM | POA: Diagnosis not present

## 2021-09-05 DIAGNOSIS — R300 Dysuria: Secondary | ICD-10-CM | POA: Diagnosis not present

## 2021-09-05 DIAGNOSIS — M6281 Muscle weakness (generalized): Secondary | ICD-10-CM | POA: Diagnosis not present

## 2021-09-06 DIAGNOSIS — M6281 Muscle weakness (generalized): Secondary | ICD-10-CM | POA: Diagnosis not present

## 2021-09-06 DIAGNOSIS — R262 Difficulty in walking, not elsewhere classified: Secondary | ICD-10-CM | POA: Diagnosis not present

## 2021-09-06 DIAGNOSIS — Z1159 Encounter for screening for other viral diseases: Secondary | ICD-10-CM | POA: Diagnosis not present

## 2021-09-06 DIAGNOSIS — S82232D Displaced oblique fracture of shaft of left tibia, subsequent encounter for closed fracture with routine healing: Secondary | ICD-10-CM | POA: Diagnosis not present

## 2021-09-07 DIAGNOSIS — M6281 Muscle weakness (generalized): Secondary | ICD-10-CM | POA: Diagnosis not present

## 2021-09-07 DIAGNOSIS — R262 Difficulty in walking, not elsewhere classified: Secondary | ICD-10-CM | POA: Diagnosis not present

## 2021-09-07 DIAGNOSIS — Z1159 Encounter for screening for other viral diseases: Secondary | ICD-10-CM | POA: Diagnosis not present

## 2021-09-07 DIAGNOSIS — S82232D Displaced oblique fracture of shaft of left tibia, subsequent encounter for closed fracture with routine healing: Secondary | ICD-10-CM | POA: Diagnosis not present

## 2021-09-09 DIAGNOSIS — Z1159 Encounter for screening for other viral diseases: Secondary | ICD-10-CM | POA: Diagnosis not present

## 2021-09-09 DIAGNOSIS — M6281 Muscle weakness (generalized): Secondary | ICD-10-CM | POA: Diagnosis not present

## 2021-09-09 DIAGNOSIS — S82232D Displaced oblique fracture of shaft of left tibia, subsequent encounter for closed fracture with routine healing: Secondary | ICD-10-CM | POA: Diagnosis not present

## 2021-09-09 DIAGNOSIS — R262 Difficulty in walking, not elsewhere classified: Secondary | ICD-10-CM | POA: Diagnosis not present

## 2021-09-10 DIAGNOSIS — Z1159 Encounter for screening for other viral diseases: Secondary | ICD-10-CM | POA: Diagnosis not present

## 2021-09-10 DIAGNOSIS — S82232D Displaced oblique fracture of shaft of left tibia, subsequent encounter for closed fracture with routine healing: Secondary | ICD-10-CM | POA: Diagnosis not present

## 2021-09-10 DIAGNOSIS — R262 Difficulty in walking, not elsewhere classified: Secondary | ICD-10-CM | POA: Diagnosis not present

## 2021-09-10 DIAGNOSIS — M6281 Muscle weakness (generalized): Secondary | ICD-10-CM | POA: Diagnosis not present

## 2021-09-11 DIAGNOSIS — R262 Difficulty in walking, not elsewhere classified: Secondary | ICD-10-CM | POA: Diagnosis not present

## 2021-09-11 DIAGNOSIS — Z1159 Encounter for screening for other viral diseases: Secondary | ICD-10-CM | POA: Diagnosis not present

## 2021-09-11 DIAGNOSIS — M6281 Muscle weakness (generalized): Secondary | ICD-10-CM | POA: Diagnosis not present

## 2021-09-11 DIAGNOSIS — S82232D Displaced oblique fracture of shaft of left tibia, subsequent encounter for closed fracture with routine healing: Secondary | ICD-10-CM | POA: Diagnosis not present

## 2021-09-12 DIAGNOSIS — S82232D Displaced oblique fracture of shaft of left tibia, subsequent encounter for closed fracture with routine healing: Secondary | ICD-10-CM | POA: Diagnosis not present

## 2021-09-12 DIAGNOSIS — R262 Difficulty in walking, not elsewhere classified: Secondary | ICD-10-CM | POA: Diagnosis not present

## 2021-09-12 DIAGNOSIS — M6281 Muscle weakness (generalized): Secondary | ICD-10-CM | POA: Diagnosis not present

## 2021-09-12 DIAGNOSIS — Z1159 Encounter for screening for other viral diseases: Secondary | ICD-10-CM | POA: Diagnosis not present

## 2021-09-15 DIAGNOSIS — M6281 Muscle weakness (generalized): Secondary | ICD-10-CM | POA: Diagnosis not present

## 2021-09-15 DIAGNOSIS — R262 Difficulty in walking, not elsewhere classified: Secondary | ICD-10-CM | POA: Diagnosis not present

## 2021-09-15 DIAGNOSIS — F5101 Primary insomnia: Secondary | ICD-10-CM | POA: Diagnosis not present

## 2021-09-15 DIAGNOSIS — S82232D Displaced oblique fracture of shaft of left tibia, subsequent encounter for closed fracture with routine healing: Secondary | ICD-10-CM | POA: Diagnosis not present

## 2021-09-15 DIAGNOSIS — Z1159 Encounter for screening for other viral diseases: Secondary | ICD-10-CM | POA: Diagnosis not present

## 2021-09-16 DIAGNOSIS — M6281 Muscle weakness (generalized): Secondary | ICD-10-CM | POA: Diagnosis not present

## 2021-09-16 DIAGNOSIS — R262 Difficulty in walking, not elsewhere classified: Secondary | ICD-10-CM | POA: Diagnosis not present

## 2021-09-16 DIAGNOSIS — S82232D Displaced oblique fracture of shaft of left tibia, subsequent encounter for closed fracture with routine healing: Secondary | ICD-10-CM | POA: Diagnosis not present

## 2021-09-16 DIAGNOSIS — Z1159 Encounter for screening for other viral diseases: Secondary | ICD-10-CM | POA: Diagnosis not present

## 2021-09-17 DIAGNOSIS — M6281 Muscle weakness (generalized): Secondary | ICD-10-CM | POA: Diagnosis not present

## 2021-09-17 DIAGNOSIS — Z1159 Encounter for screening for other viral diseases: Secondary | ICD-10-CM | POA: Diagnosis not present

## 2021-09-17 DIAGNOSIS — S82232D Displaced oblique fracture of shaft of left tibia, subsequent encounter for closed fracture with routine healing: Secondary | ICD-10-CM | POA: Diagnosis not present

## 2021-09-17 DIAGNOSIS — R262 Difficulty in walking, not elsewhere classified: Secondary | ICD-10-CM | POA: Diagnosis not present

## 2021-09-18 DIAGNOSIS — Z1159 Encounter for screening for other viral diseases: Secondary | ICD-10-CM | POA: Diagnosis not present

## 2021-09-18 DIAGNOSIS — S82232D Displaced oblique fracture of shaft of left tibia, subsequent encounter for closed fracture with routine healing: Secondary | ICD-10-CM | POA: Diagnosis not present

## 2021-09-18 DIAGNOSIS — M6281 Muscle weakness (generalized): Secondary | ICD-10-CM | POA: Diagnosis not present

## 2021-09-18 DIAGNOSIS — R262 Difficulty in walking, not elsewhere classified: Secondary | ICD-10-CM | POA: Diagnosis not present

## 2021-09-19 DIAGNOSIS — Z1159 Encounter for screening for other viral diseases: Secondary | ICD-10-CM | POA: Diagnosis not present

## 2021-09-19 DIAGNOSIS — M6281 Muscle weakness (generalized): Secondary | ICD-10-CM | POA: Diagnosis not present

## 2021-09-19 DIAGNOSIS — S82232D Displaced oblique fracture of shaft of left tibia, subsequent encounter for closed fracture with routine healing: Secondary | ICD-10-CM | POA: Diagnosis not present

## 2021-09-19 DIAGNOSIS — R262 Difficulty in walking, not elsewhere classified: Secondary | ICD-10-CM | POA: Diagnosis not present

## 2021-09-20 DIAGNOSIS — S82232D Displaced oblique fracture of shaft of left tibia, subsequent encounter for closed fracture with routine healing: Secondary | ICD-10-CM | POA: Diagnosis not present

## 2021-09-20 DIAGNOSIS — M6281 Muscle weakness (generalized): Secondary | ICD-10-CM | POA: Diagnosis not present

## 2021-09-20 DIAGNOSIS — Z1159 Encounter for screening for other viral diseases: Secondary | ICD-10-CM | POA: Diagnosis not present

## 2021-09-20 DIAGNOSIS — R262 Difficulty in walking, not elsewhere classified: Secondary | ICD-10-CM | POA: Diagnosis not present

## 2021-09-21 DIAGNOSIS — J449 Chronic obstructive pulmonary disease, unspecified: Secondary | ICD-10-CM | POA: Diagnosis not present

## 2021-09-22 DIAGNOSIS — S82232D Displaced oblique fracture of shaft of left tibia, subsequent encounter for closed fracture with routine healing: Secondary | ICD-10-CM | POA: Diagnosis not present

## 2021-09-22 DIAGNOSIS — M6281 Muscle weakness (generalized): Secondary | ICD-10-CM | POA: Diagnosis not present

## 2021-09-22 DIAGNOSIS — R262 Difficulty in walking, not elsewhere classified: Secondary | ICD-10-CM | POA: Diagnosis not present

## 2021-09-22 DIAGNOSIS — Z1159 Encounter for screening for other viral diseases: Secondary | ICD-10-CM | POA: Diagnosis not present

## 2021-09-23 DIAGNOSIS — M6281 Muscle weakness (generalized): Secondary | ICD-10-CM | POA: Diagnosis not present

## 2021-09-23 DIAGNOSIS — R262 Difficulty in walking, not elsewhere classified: Secondary | ICD-10-CM | POA: Diagnosis not present

## 2021-09-23 DIAGNOSIS — S82232D Displaced oblique fracture of shaft of left tibia, subsequent encounter for closed fracture with routine healing: Secondary | ICD-10-CM | POA: Diagnosis not present

## 2021-09-23 DIAGNOSIS — E1165 Type 2 diabetes mellitus with hyperglycemia: Secondary | ICD-10-CM | POA: Diagnosis not present

## 2021-09-23 DIAGNOSIS — S82222D Displaced transverse fracture of shaft of left tibia, subsequent encounter for closed fracture with routine healing: Secondary | ICD-10-CM | POA: Diagnosis not present

## 2021-09-23 DIAGNOSIS — J449 Chronic obstructive pulmonary disease, unspecified: Secondary | ICD-10-CM | POA: Diagnosis not present

## 2021-09-23 DIAGNOSIS — I5032 Chronic diastolic (congestive) heart failure: Secondary | ICD-10-CM | POA: Diagnosis not present

## 2021-09-23 DIAGNOSIS — Z1159 Encounter for screening for other viral diseases: Secondary | ICD-10-CM | POA: Diagnosis not present

## 2021-09-23 DIAGNOSIS — N1832 Chronic kidney disease, stage 3b: Secondary | ICD-10-CM | POA: Diagnosis not present

## 2021-09-24 DIAGNOSIS — I2510 Atherosclerotic heart disease of native coronary artery without angina pectoris: Secondary | ICD-10-CM | POA: Diagnosis not present

## 2021-09-24 DIAGNOSIS — I252 Old myocardial infarction: Secondary | ICD-10-CM | POA: Diagnosis not present

## 2021-09-24 DIAGNOSIS — Z7985 Long-term (current) use of injectable non-insulin antidiabetic drugs: Secondary | ICD-10-CM | POA: Diagnosis not present

## 2021-09-24 DIAGNOSIS — S82232D Displaced oblique fracture of shaft of left tibia, subsequent encounter for closed fracture with routine healing: Secondary | ICD-10-CM | POA: Diagnosis not present

## 2021-09-24 DIAGNOSIS — Z794 Long term (current) use of insulin: Secondary | ICD-10-CM | POA: Diagnosis not present

## 2021-09-24 DIAGNOSIS — N1832 Chronic kidney disease, stage 3b: Secondary | ICD-10-CM | POA: Diagnosis not present

## 2021-09-24 DIAGNOSIS — I5032 Chronic diastolic (congestive) heart failure: Secondary | ICD-10-CM | POA: Diagnosis not present

## 2021-09-24 DIAGNOSIS — Z7984 Long term (current) use of oral hypoglycemic drugs: Secondary | ICD-10-CM | POA: Diagnosis not present

## 2021-09-24 DIAGNOSIS — E1122 Type 2 diabetes mellitus with diabetic chronic kidney disease: Secondary | ICD-10-CM | POA: Diagnosis not present

## 2021-09-24 DIAGNOSIS — I130 Hypertensive heart and chronic kidney disease with heart failure and stage 1 through stage 4 chronic kidney disease, or unspecified chronic kidney disease: Secondary | ICD-10-CM | POA: Diagnosis not present

## 2021-09-24 DIAGNOSIS — J449 Chronic obstructive pulmonary disease, unspecified: Secondary | ICD-10-CM | POA: Diagnosis not present

## 2021-09-24 DIAGNOSIS — K219 Gastro-esophageal reflux disease without esophagitis: Secondary | ICD-10-CM | POA: Diagnosis not present

## 2021-09-25 DIAGNOSIS — Z09 Encounter for follow-up examination after completed treatment for conditions other than malignant neoplasm: Secondary | ICD-10-CM | POA: Diagnosis not present

## 2021-09-25 DIAGNOSIS — Z79899 Other long term (current) drug therapy: Secondary | ICD-10-CM | POA: Diagnosis not present

## 2021-09-30 DIAGNOSIS — J449 Chronic obstructive pulmonary disease, unspecified: Secondary | ICD-10-CM | POA: Diagnosis not present

## 2021-09-30 DIAGNOSIS — Z7984 Long term (current) use of oral hypoglycemic drugs: Secondary | ICD-10-CM | POA: Diagnosis not present

## 2021-09-30 DIAGNOSIS — I5032 Chronic diastolic (congestive) heart failure: Secondary | ICD-10-CM | POA: Diagnosis not present

## 2021-09-30 DIAGNOSIS — Z7985 Long-term (current) use of injectable non-insulin antidiabetic drugs: Secondary | ICD-10-CM | POA: Diagnosis not present

## 2021-09-30 DIAGNOSIS — I2510 Atherosclerotic heart disease of native coronary artery without angina pectoris: Secondary | ICD-10-CM | POA: Diagnosis not present

## 2021-09-30 DIAGNOSIS — S82232D Displaced oblique fracture of shaft of left tibia, subsequent encounter for closed fracture with routine healing: Secondary | ICD-10-CM | POA: Diagnosis not present

## 2021-09-30 DIAGNOSIS — E1122 Type 2 diabetes mellitus with diabetic chronic kidney disease: Secondary | ICD-10-CM | POA: Diagnosis not present

## 2021-09-30 DIAGNOSIS — K219 Gastro-esophageal reflux disease without esophagitis: Secondary | ICD-10-CM | POA: Diagnosis not present

## 2021-09-30 DIAGNOSIS — N1832 Chronic kidney disease, stage 3b: Secondary | ICD-10-CM | POA: Diagnosis not present

## 2021-09-30 DIAGNOSIS — I252 Old myocardial infarction: Secondary | ICD-10-CM | POA: Diagnosis not present

## 2021-09-30 DIAGNOSIS — I130 Hypertensive heart and chronic kidney disease with heart failure and stage 1 through stage 4 chronic kidney disease, or unspecified chronic kidney disease: Secondary | ICD-10-CM | POA: Diagnosis not present

## 2021-09-30 DIAGNOSIS — Z794 Long term (current) use of insulin: Secondary | ICD-10-CM | POA: Diagnosis not present

## 2021-10-01 DIAGNOSIS — Z7985 Long-term (current) use of injectable non-insulin antidiabetic drugs: Secondary | ICD-10-CM | POA: Diagnosis not present

## 2021-10-01 DIAGNOSIS — I252 Old myocardial infarction: Secondary | ICD-10-CM | POA: Diagnosis not present

## 2021-10-01 DIAGNOSIS — J449 Chronic obstructive pulmonary disease, unspecified: Secondary | ICD-10-CM | POA: Diagnosis not present

## 2021-10-01 DIAGNOSIS — S82232D Displaced oblique fracture of shaft of left tibia, subsequent encounter for closed fracture with routine healing: Secondary | ICD-10-CM | POA: Diagnosis not present

## 2021-10-01 DIAGNOSIS — N1832 Chronic kidney disease, stage 3b: Secondary | ICD-10-CM | POA: Diagnosis not present

## 2021-10-01 DIAGNOSIS — Z7984 Long term (current) use of oral hypoglycemic drugs: Secondary | ICD-10-CM | POA: Diagnosis not present

## 2021-10-01 DIAGNOSIS — K219 Gastro-esophageal reflux disease without esophagitis: Secondary | ICD-10-CM | POA: Diagnosis not present

## 2021-10-01 DIAGNOSIS — I2510 Atherosclerotic heart disease of native coronary artery without angina pectoris: Secondary | ICD-10-CM | POA: Diagnosis not present

## 2021-10-01 DIAGNOSIS — E1122 Type 2 diabetes mellitus with diabetic chronic kidney disease: Secondary | ICD-10-CM | POA: Diagnosis not present

## 2021-10-01 DIAGNOSIS — I130 Hypertensive heart and chronic kidney disease with heart failure and stage 1 through stage 4 chronic kidney disease, or unspecified chronic kidney disease: Secondary | ICD-10-CM | POA: Diagnosis not present

## 2021-10-01 DIAGNOSIS — I5032 Chronic diastolic (congestive) heart failure: Secondary | ICD-10-CM | POA: Diagnosis not present

## 2021-10-01 DIAGNOSIS — Z794 Long term (current) use of insulin: Secondary | ICD-10-CM | POA: Diagnosis not present

## 2021-10-02 DIAGNOSIS — Z7984 Long term (current) use of oral hypoglycemic drugs: Secondary | ICD-10-CM | POA: Diagnosis not present

## 2021-10-02 DIAGNOSIS — I5032 Chronic diastolic (congestive) heart failure: Secondary | ICD-10-CM | POA: Diagnosis not present

## 2021-10-02 DIAGNOSIS — I130 Hypertensive heart and chronic kidney disease with heart failure and stage 1 through stage 4 chronic kidney disease, or unspecified chronic kidney disease: Secondary | ICD-10-CM | POA: Diagnosis not present

## 2021-10-02 DIAGNOSIS — Z794 Long term (current) use of insulin: Secondary | ICD-10-CM | POA: Diagnosis not present

## 2021-10-02 DIAGNOSIS — I2510 Atherosclerotic heart disease of native coronary artery without angina pectoris: Secondary | ICD-10-CM | POA: Diagnosis not present

## 2021-10-02 DIAGNOSIS — Z7985 Long-term (current) use of injectable non-insulin antidiabetic drugs: Secondary | ICD-10-CM | POA: Diagnosis not present

## 2021-10-02 DIAGNOSIS — I252 Old myocardial infarction: Secondary | ICD-10-CM | POA: Diagnosis not present

## 2021-10-02 DIAGNOSIS — K219 Gastro-esophageal reflux disease without esophagitis: Secondary | ICD-10-CM | POA: Diagnosis not present

## 2021-10-02 DIAGNOSIS — J449 Chronic obstructive pulmonary disease, unspecified: Secondary | ICD-10-CM | POA: Diagnosis not present

## 2021-10-02 DIAGNOSIS — S82232D Displaced oblique fracture of shaft of left tibia, subsequent encounter for closed fracture with routine healing: Secondary | ICD-10-CM | POA: Diagnosis not present

## 2021-10-02 DIAGNOSIS — N1832 Chronic kidney disease, stage 3b: Secondary | ICD-10-CM | POA: Diagnosis not present

## 2021-10-02 DIAGNOSIS — E1122 Type 2 diabetes mellitus with diabetic chronic kidney disease: Secondary | ICD-10-CM | POA: Diagnosis not present

## 2021-10-30 ENCOUNTER — Encounter (HOSPITAL_BASED_OUTPATIENT_CLINIC_OR_DEPARTMENT_OTHER): Payer: 59 | Attending: Internal Medicine | Admitting: Internal Medicine

## 2022-03-04 NOTE — Progress Notes (Signed)
Hellwig, Corrie C. ([id number]) Visit Report for 03/05/2022 Allergy List Details Patient Name: Date of Service: [name] Bushy C. 03/05/2022 1:15 PM Medical Record Number: [id number] Patient Account Number: [id number] Date of Birth/Sex: Treating RN: 04/04/1961 (61 y.o. [name] Primary Care Sundra Haddix: Other Clinician: Referring Keyairra Kolinski: Treating [name]/Extender: Tilda Franco in Treatment: 0 Allergies Active Allergies ceftriaxone Reaction: N/V Allergy Notes Electronic Signature(s) Signed: 03/04/2022 9:22:10 AM By: Antonieta Iba Entered By: Antonieta Iba on 03/04/2022 09:22:10

## 2022-03-05 ENCOUNTER — Encounter (HOSPITAL_BASED_OUTPATIENT_CLINIC_OR_DEPARTMENT_OTHER): Payer: 59 | Attending: Internal Medicine | Admitting: Internal Medicine

## 2022-03-05 DIAGNOSIS — Z7984 Long term (current) use of oral hypoglycemic drugs: Secondary | ICD-10-CM | POA: Diagnosis not present

## 2022-03-05 DIAGNOSIS — Z8711 Personal history of peptic ulcer disease: Secondary | ICD-10-CM | POA: Diagnosis not present

## 2022-03-05 DIAGNOSIS — I110 Hypertensive heart disease with heart failure: Secondary | ICD-10-CM | POA: Insufficient documentation

## 2022-03-05 DIAGNOSIS — E11621 Type 2 diabetes mellitus with foot ulcer: Secondary | ICD-10-CM | POA: Diagnosis not present

## 2022-03-05 DIAGNOSIS — Z6841 Body Mass Index (BMI) 40.0 and over, adult: Secondary | ICD-10-CM | POA: Insufficient documentation

## 2022-03-05 DIAGNOSIS — I5032 Chronic diastolic (congestive) heart failure: Secondary | ICD-10-CM | POA: Insufficient documentation

## 2022-03-05 DIAGNOSIS — L97522 Non-pressure chronic ulcer of other part of left foot with fat layer exposed: Secondary | ICD-10-CM | POA: Diagnosis present

## 2022-03-05 DIAGNOSIS — I252 Old myocardial infarction: Secondary | ICD-10-CM | POA: Diagnosis not present

## 2022-03-05 DIAGNOSIS — E1140 Type 2 diabetes mellitus with diabetic neuropathy, unspecified: Secondary | ICD-10-CM | POA: Diagnosis not present

## 2022-03-05 DIAGNOSIS — J449 Chronic obstructive pulmonary disease, unspecified: Secondary | ICD-10-CM | POA: Diagnosis not present

## 2022-03-05 DIAGNOSIS — K219 Gastro-esophageal reflux disease without esophagitis: Secondary | ICD-10-CM | POA: Diagnosis not present

## 2022-03-05 DIAGNOSIS — Z794 Long term (current) use of insulin: Secondary | ICD-10-CM | POA: Diagnosis not present

## 2022-03-05 NOTE — Progress Notes (Signed)
[name], [name]. ([id number]) Visit Report for 03/05/2022 Abuse Risk Screen Details Patient Name: Date of Service: [name] Bushy C. 03/05/2022 1:15 PM Medical Record Number: [id number] Patient Account Number: [id number] Date of Birth/Sex: Treating RN: 1961/06/29 (61 y.o. [name] Primary Care Dontavion Noxon: [name] Other Clinician: Referring Ziasia Lenoir: Treating Medina Degraffenreid/Extender: [name] in Treatment: 0 Abuse Risk Screen Items Answer ABUSE RISK SCREEN: Has anyone close to you tried to hurt or harm you recentlyo No Do you feel uncomfortable with anyone in your familyo No Has anyone forced you do things that you didnt want to doo No Electronic Signature(s) Signed: 03/05/2022 3:50:29 PM By: Antonieta Iba Entered By: Antonieta Iba on 03/05/2022 13:44:15 -------------------------------------------------------------------------------- Activities of Daily Living Details Patient Name: Date of Service: [name] Bushy C. 03/05/2022 1:15 PM Medical Record Number: [id number] Patient Account Number: [id number] Date of Birth/Sex: Treating RN: 02-24-1961 (61 y.o. [name] Primary Care [name] Jr: [name] Other Clinician: Referring Samaia Iwata: Treating [name]/Extender: [name] in Treatment: 0 Activities of Daily Living Items Answer Activities of Daily Living (Please select one for each item) Drive Automobile Not Able T Medications ake Completely Able Use T elephone Completely Able Care for Appearance Completely Able Use T oilet Completely Able Bath / Shower Completely Able Dress Self Completely Able Feed Self Completely Able Walk Need Assistance Get In / Out Bed Completely Able Housework Need Assistance Prepare Meals Completely Able Handle Money Completely Able Shop for Self Need Assistance Electronic Signature(s) Signed: 03/05/2022 3:50:29 PM By: Antonieta Iba Entered By: Antonieta Iba on  03/05/2022 13:44:53 -------------------------------------------------------------------------------- Education Screening Details Patient Name: Date of Service: [name] Bushy C. 03/05/2022 1:15 PM Medical Record Number: [id number] Patient Account Number: [id number] Date of Birth/Sex: Treating RN: 1961/07/21 (61 y.o. [name] Primary Care [name]: [name] Other Clinician: Referring [name]: Treating [name]/Extender: [name] in Treatment: 0 Primary Learner Assessed: Patient Learning Preferences/Education Level/Primary Language Learning Preference: Explanation, Demonstration, Printed Material Highest Education Level: High School Preferred Language: English Cognitive Barrier Language Barrier: No Translator Needed: No Memory Deficit: No Emotional Barrier: No Cultural/Religious Beliefs Affecting Medical Care: No Physical Barrier Impaired Vision: Yes Glasses Impaired Hearing: No Decreased Hand dexterity: No Knowledge/Comprehension Knowledge Level: High Comprehension Level: High Ability to understand written instructions: High Ability to understand verbal instructions: High Motivation Anxiety Level: Calm Cooperation: Cooperative Education Importance: Acknowledges Need Interest in Health Problems: Asks Questions Perception: Coherent Willingness to Engage in Self-Management High Activities: Readiness to Engage in Self-Management High Activities: Electronic Signature(s) Signed: 03/05/2022 3:50:29 PM By: Antonieta Iba Entered By: Antonieta Iba on 03/05/2022 13:45:30 -------------------------------------------------------------------------------- Fall Risk Assessment Details Patient Name: Date of Service: HUFFMA [name] C. 03/05/2022 1:15 PM Medical Record Number: [id number] Patient Account Number: [id number] Date of Birth/Sex: Treating RN: 1961/04/30 (61 y.o. [name] Primary Care [name]: [name] Other Clinician: Referring Sarath Privott: Treating Solyana Nonaka/Extender: [name] in Treatment: 0 Fall Risk Assessment Items Have you had 2 or more falls in the last 12 monthso 0 Yes Have you had any fall that resulted in injury in the last 12 monthso 0 No FALLS RISK SCREEN History of falling - immediate or within 3 months 25 Yes Secondary diagnosis (Do you have 2 or more medical diagnoseso) 15 Yes Ambulatory aid None/bed rest/wheelchair/nurse 0 No Crutches/cane/walker 15 Yes Furniture 0 No Intravenous therapy Access/Saline/Heparin Lock 0 No Gait/Transferring Normal/ bed rest/ wheelchair 0 No Weak (short steps with or  without shuffle, stooped but able to lift head while walking, may seek 10 Yes support from furniture) Impaired (short steps with shuffle, may have difficulty arising from chair, head down, impaired 0 No balance) Mental Status Oriented to own ability 0 Yes Electronic Signature(s) Signed: 03/05/2022 3:50:29 PM By: Antonieta Iba Entered By: Antonieta Iba on 03/05/2022 13:46:38 -------------------------------------------------------------------------------- Foot Assessment Details Patient Name: Date of Service: [name] Bushy C. 03/05/2022 1:15 PM Medical Record Number: [id number] Patient Account Number: [id number] Date of Birth/Sex: Treating RN: [birth year] (61 y.o. [name] Primary Care [name]: [name] Other Clinician: Referring [name]: Treating [place] Inthavong/Extender: [name] in Treatment: 0 Foot Assessment Items Site Locations + = Sensation present, - = Sensation absent, C = Callus, U = Ulcer R = Redness, W = Warmth, M = Maceration, PU = Pre-ulcerative lesion F = Fissure, S = Swelling, D = Dryness Assessment Right: Left: Other Deformity: No No Prior Foot Ulcer: No No Prior Amputation: No No Charcot Joint: No No Ambulatory Status: Ambulatory With Help Assistance Device:  Walker Gait: Steady Electronic Signature(s) Signed: 03/05/2022 3:50:29 PM By: Antonieta Iba Entered By: Antonieta Iba on 03/05/2022 13:50:50 -------------------------------------------------------------------------------- Nutrition Risk Screening Details Patient Name: Date of Service: [name] Bushy C. 03/05/2022 1:15 PM Medical Record Number: [id number] Patient Account Number: [id number] Date of Birth/Sex: Treating RN: 13-Jul-1961 (61 y.o. [name] Primary Care Wyley Hack: [name] Other Clinician: Referring Aolanis Crispen: Treating Zebulan Hinshaw/Extender: [name] in Treatment: 0 Height (in): 66 Weight (lbs): 339 Body Mass Index (BMI): 54.7 Nutrition Risk Screening Items Score Screening NUTRITION RISK SCREEN: I have an illness or condition that made me change the kind and/or amount of food I eat 0 No I eat fewer than two meals per day 0 No I eat few fruits and vegetables, or milk products 0 No I have three or more drinks of beer, liquor or wine almost every day 0 No I have tooth or mouth problems that make it hard for me to eat 0 No I don't always have enough money to buy the food I need 0 No I eat alone most of the time 0 No I take three or more different prescribed or over-the-counter drugs a day 1 Yes Without wanting to, I have lost or gained 10 pounds in the last six months 0 No I am not always physically able to shop, cook and/or feed myself 0 No Nutrition Protocols Good Risk Protocol 0 No interventions needed Moderate Risk Protocol High Risk Proctocol Risk Level: Good Risk Score: 1 Electronic Signature(s) Signed: 03/05/2022 3:50:29 PM By: Antonieta Iba Entered By: Antonieta Iba on 03/05/2022 13:46:47

## 2022-03-05 NOTE — Progress Notes (Signed)
Portlock, Evani C. ([id number]) Visit Report for 03/05/2022 Chief Complaint Document Details Patient Name: Date of Service: [name] 03/05/2022 1:15 PM Medical Record Number: [id number] Patient Account Number: [id number] Date of Birth/Sex: Treating RN: 08-Sep-1961 (61 y.o. [name] Primary Care Provider: [name] Other Clinician: Referring Provider: Treating Provider/Extender: [name] in Treatment: 0 Information Obtained from: Patient Chief Complaint 03/05/2022; left foot wound Electronic Signature(s) Signed: 03/05/2022 3:07:46 PM By: Geralyn Corwin DO Entered By: Geralyn Corwin on 03/05/2022 14:51:48 -------------------------------------------------------------------------------- Debridement Details Patient Name: Date of Service: [name] Bushy C. 03/05/2022 1:15 PM Medical Record Number: [id number] Patient Account Number: [id number] Date of Birth/Sex: Treating RN: 13-Feb-1961 (61 y.o. [name] Primary Care Provider: [name] Other Clinician: Referring Provider: Treating Provider/Extender: [name] in Treatment: 0 Debridement Performed for Assessment: Wound #1 Left Calcaneus Performed By: Physician Geralyn Corwin, DO Debridement Type: Debridement Severity of Tissue Pre Debridement: Fat layer exposed Level of Consciousness (Pre-procedure): Awake and Alert Pre-procedure Verification/Time Out Yes - 14:14 Taken: Start Time: 14:15 Pain Control: Other : Benzocaine T Area Debrided (L x W): otal 2.8 (cm) x 2 (cm) = 5.6 (cm) Tissue and other material debrided: Non-Viable, Slough, Subcutaneous, Slough Level: Skin/Subcutaneous Tissue Debridement Description: Excisional Instrument: Curette Bleeding: Minimum Hemostasis Achieved: Pressure End Time: 14:21 Response to Treatment: Procedure was tolerated well Level of Consciousness (Post- Awake and Alert procedure): Post Debridement  Measurements of Total Wound Length: (cm) 2.8 Width: (cm) 2 Depth: (cm) 0.2 Volume: (cm) 0.88 Character of Wound/Ulcer Post Debridement: Stable Severity of Tissue Post Debridement: Fat layer exposed Post Procedure Diagnosis Same as Pre-procedure Electronic Signature(s) Signed: 03/05/2022 3:07:46 PM By: Geralyn Corwin DO Signed: 03/05/2022 3:50:29 PM By: Antonieta Iba Entered By: Antonieta Iba on 03/05/2022 14:21:40 -------------------------------------------------------------------------------- HPI Details Patient Name: Date of Service: [name] Bushy C. 03/05/2022 1:15 PM Medical Record Number: [id number] Patient Account Number: [id number] Date of Birth/Sex: Treating RN: 1961-05-02 (61 y.o. [name] Primary Care Provider: [name] Other Clinician: Referring Provider: Treating Provider/Extender: [name] in Treatment: 0 History of Present Illness HPI Description: Admission 03/05/2022 [name] is a 61 year old female with a past medical history of uncontrolled insulin-dependent type 2 diabetes, tobacco user and chronic diastolic heart failure that presents to the clinic for a 25-month history of wound to her left heel. She states she had a left ankle fusion in September 2022. She states that she always had a wound after the surgery and it never healed. She has home health and they have been doing compression wraps along with silver alginate to the wound bed. She currently denies signs of infection. Electronic Signature(s) Signed: 03/05/2022 3:07:46 PM By: Geralyn Corwin DO Entered By: Geralyn Corwin on 03/05/2022 15:01:00 -------------------------------------------------------------------------------- Physical Exam Details Patient Name: Date of Service: [name] Bushy C. 03/05/2022 1:15 PM Medical Record Number: [id number] Patient Account Number: [id number] Date of Birth/Sex: Treating RN: July 14, 1961 (61 y.o. [name] Primary Care Provider: [name] Other Clinician: Referring Provider: Treating Provider/Extender: [name] in Treatment: 0 Constitutional respirations regular, non-labored and within target range for patient.. Cardiovascular 2+ dorsalis pedis/posterior tibialis pulses. Psychiatric pleasant and cooperative. Notes Left foot: T the posterior ankle there is an open wound with nonviable surface throughout. Postdebridement there is granulation tissue although this appeared o pale. No signs of surrounding infection. Electronic Signature(s) Signed: 03/05/2022 3:07:46 PM By: Geralyn Corwin DO Entered By: Geralyn Corwin  on 03/05/2022 15:01:45 -------------------------------------------------------------------------------- Physician Orders Details Patient Name: Date of Service: [name] Bushy C. 03/05/2022 1:15 PM Medical Record Number: [id number] Patient Account Number: [id number] Date of Birth/Sex: Treating RN: 06-23-1961 (61 y.o. [name] Primary Care Provider: [name] Other Clinician: Referring Provider: Treating Provider/Extender: [name] in Treatment: 0 Verbal / Phone Orders: No Diagnosis Coding ICD-10 Coding Code Description E11.621 Type 2 diabetes mellitus with foot ulcer E66.01 Morbid (severe) obesity due to excess calories L97.522 Non-pressure chronic ulcer of other part of left foot with fat layer exposed Follow-up Appointments ppointment in 1 week. - 03/12/22 @ 3:30pm with Dr. Mikey Bussing Lennox Laity, Room 7) Return A Bathing/ Shower/ Hygiene May shower with protection but do not get wound dressing(s) wet. - May use cast protector bag from [facility], [facility] or [employer] Edema Control - Lymphedema / SCD / Other Elevate legs to the level of the heart or above for 30 minutes daily and/or when sitting, a frequency of: Avoid standing for long periods of time. Additional Orders /  Instructions Follow Nutritious Diet - Monitor/Control Blood Sugar Home Health New wound care orders this week; continue Home Health for wound care. May utilize formulary equivalent dressing for wound treatment orders unless otherwise specified. - Skilled nursing for wound care 1x and wrap changed in office 1x week. Other Home Health Orders/Instructions: - [facility] Wound Treatment Wound #1 - Calcaneus Wound Laterality: Left Cleanser: Soap and Water (Home Health) 2 x Per Week/30 Days Discharge Instructions: May shower and wash wound with dial antibacterial soap and water prior to dressing change. Cleanser: Wound Cleanser 2 x Per Week/30 Days Discharge Instructions: Cleanse the wound with wound cleanser prior to applying a clean dressing using gauze sponges, not tissue or cotton balls. Peri-Wound Care: Triamcinolone 15 (g) 2 x Per Week/30 Days Discharge Instructions: Use triamcinolone 15 (g) as directed Peri-Wound Care: Sween Lotion (Moisturizing lotion) (Home Health) 2 x Per Week/30 Days Discharge Instructions: Apply moisturizing lotion as directed Prim Dressing: Hydrofera Blue Ready Foam, 2.5 x2.5 in (Home Health) 2 x Per Week/30 Days ary Discharge Instructions: Apply to wound bed as instructed Prim Dressing: Santyl Ointment 2 x Per Week/30 Days ary Discharge Instructions: **In Office Only** Secondary Dressing: ABD Pad, 8x10 (Home Health) 2 x Per Week/30 Days Discharge Instructions: Apply over primary dressing as directed. Compression Wrap: ThreePress (3 layer compression wrap) (Home Health) 2 x Per Week/30 Days Discharge Instructions: Apply three layer compression as directed. Radiology X-ray, foot: Left Foot - Chronic left foot wound, r/o osteomyelitis - (ICD10 L97.522 - Non-pressure chronic ulcer of other part of left foot with fat layer exposed) Electronic Signature(s) Signed: 03/05/2022 3:07:46 PM By: Geralyn Corwin DO Entered By: Geralyn Corwin on 03/05/2022  15:01:54 Prescription 03/05/2022 -------------------------------------------------------------------------------- Montilla, Daryana C. Geralyn Corwin DO Patient Name: Provider: 1960-11-20 [id number] Date of Birth: NPI#: [id number] Sex: DEA #: [phone] Phone #: License #: Eligha Bridegroom [facility] Patient Address: [address] [place], [place] [address] [address] [place], [place] [address] [phone] Allergies ceftriaxone Provider's Orders X-ray, foot: Left Foot - ICD10: L97.522 - Chronic left foot wound, r/o osteomyelitis Hand Signature: Date(s): Electronic Signature(s) Signed: 03/05/2022 3:07:46 PM By: Geralyn Corwin DO Entered By: Geralyn Corwin on 03/05/2022 15:01:55 -------------------------------------------------------------------------------- Problem List Details Patient Name: Date of Service: [name] Bushy C. 03/05/2022 1:15 PM Medical Record Number: [id number] Patient Account Number: [id number] Date of Birth/Sex: Treating RN: [birth year] (61 y.o. F)  Antonieta IbaBarnhart, [name] Primary Care Provider: Junious Dresserampbell, Stephen Other Clinician: Referring Provider: Treating Provider/Extender: Andree MoroHoffman, Kostantinos Tallman Campbell, Stephen Weeks in Treatment: 0 Active Problems ICD-10 Encounter Code Description Active Date MDM Diagnosis [phone] Non-pressure chronic ulcer of other part of left foot with fat layer exposed 03/05/2022 No Yes E11.621 Type 2 diabetes mellitus with foot ulcer 03/05/2022 No Yes E66.01 Morbid (severe) obesity due to excess calories 03/05/2022 No Yes Inactive Problems Resolved Problems Electronic Signature(s) Signed: 03/05/2022 3:07:46 PM By: Geralyn CorwinHoffman, Edgar Reisz DO Entered By: Geralyn CorwinHoffman, Cai Flott on 03/05/2022 14:51:13 -------------------------------------------------------------------------------- Progress Note Details Patient Name: Date of Service: [name] BushyHUFFMA N, [name]. 03/05/2022 1:15 PM Medical Record Number:  [id number] Patient Account Number: [id number] Date of Birth/Sex: Treating RN: 1961/05/21 (61 y.o. [name]) [name], [name] Primary Care Provider: Junious Dresserampbell, Stephen Other Clinician: Referring Provider: Treating Provider/Extender: Andree MoroHoffman, Choice Kleinsasser Campbell, Stephen Weeks in Treatment: 0 Subjective Chief Complaint Information obtained from Patient 03/05/2022; left foot wound History of Present Illness (HPI) Admission 03/05/2022 Ms. Demarie Lou MinerHuffman is a 61 year old female with a past medical history of uncontrolled insulin-dependent type 2 diabetes, tobacco user and chronic diastolic heart failure that presents to the clinic for a 2428-month history of wound to her left heel. She states she had a left ankle fusion in September 2022. She states that she always had a wound after the surgery and it never healed. She has home health and they have been doing compression wraps along with silver alginate to the wound bed. She currently denies signs of infection. Patient History Information obtained from Patient, Chart. Allergies ceftriaxone (Reaction: N/V) Family History Cancer - Father,Mother, Diabetes - Mother,Maternal Grandparents,Paternal Grandparents, Heart Disease - Siblings,Maternal Grandparents, Hypertension - Siblings, Tuberculosis - Maternal Grandparents,Paternal Grandparents. Social History Current every day smoker, Marital Status - Single, Alcohol Use - Never, Drug Use - No History, Caffeine Use - Daily. Medical History Hematologic/Lymphatic Patient has history of Anemia Respiratory Patient has history of Chronic Obstructive Pulmonary Disease (COPD) Cardiovascular Patient has history of Congestive Heart Failure - Weighs self daily, Hypertension, Myocardial Infarction, Peripheral Venous Disease Endocrine Patient has history of Type II Diabetes Neurologic Patient has history of Neuropathy Patient is treated with Insulin, Oral Agents. Blood sugar is tested. Medical A Surgical History  Notes nd Gastrointestinal GERD, Peptic Ulcer Disease Musculoskeletal Broke left leg 3 years ago, 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, foot fused Psychiatric Depression Review of Systems (ROS) Eyes Complains or has symptoms of Glasses / Contacts. Ear/Nose/Mouth/Throat Denies complaints or symptoms of Chronic sinus problems or rhinitis. Genitourinary Denies complaints or symptoms of Frequent urination. Objective Constitutional respirations regular, non-labored and within target range for patient.. Vitals Time Taken: 1:37 PM, Height: 66 in, Source: Stated, Weight: 339 lbs, Source: Stated, BMI: 54.7, Temperature: 98.3 F, Pulse: 77 bpm, Respiratory Rate: 18 breaths/min, Blood Pressure: 140/73 mmHg, Capillary Blood Glucose: 179 mg/dl. Cardiovascular 2+ dorsalis pedis/posterior tibialis pulses. Psychiatric pleasant and cooperative. General Notes: Left foot: T the posterior ankle there is an open wound with nonviable surface throughout. Postdebridement there is granulation tissue although o this appeared pale. No signs of surrounding infection. Integumentary (Hair, Skin) Wound #1 status is Open. Original cause of wound was Other Lesion. The date acquired was: 10/06/2021. The wound is located on the Left Calcaneus. The wound measures 2.8cm length x 2cm width x 0.2cm depth; 4.398cm^2 area and 0.88cm^3 volume. There is Fat Layer (Subcutaneous Tissue) exposed. There is no tunneling or undermining noted. There is a medium amount of serosanguineous drainage noted. The wound margin is distinct with the outline attached to the wound  base. There is medium (34-66%) pink granulation within the wound bed. There is a medium (34-66%) amount of necrotic tissue within the wound bed including Adherent Slough. Assessment Active Problems ICD-10 Non-pressure chronic ulcer of other part of left foot with fat layer exposed Type 2 diabetes mellitus with foot ulcer Morbid (severe) obesity due to excess calories Patient  presents with a nonhealing ulcer to her left ankle for the past 6 months Status post ankle fusion. She has a history of uncontrolled insulin-dependent type 2 diabetes And a cigarette smoker that is delaying healing. She has home health that has been changing her compression wraps and using silver alginate. I debrided nonviable tissue. Her ABIs were 1.14 and she has strong pedal pulses suggesting adequate blood flow for healing. At this time I recommended Hydrofera Blue under 3 layer compression as she does have swelling on exam and history of chronic diastolic heart failure. No signs of surrounding tissue infection. Due to the chronicity of the wound and previous surgery I recommended an x-ray to the left foot to assess for any bony destruction. Follow-up in 1 week. 40 minutes was spent on the encounter including face-to-face, EMR review and coordination of care. Procedures Wound #1 Pre-procedure diagnosis of Wound #1 is a Diabetic Wound/Ulcer of the Lower Extremity located on the Left Calcaneus .Severity of Tissue Pre Debridement is: Fat layer exposed. There was a Excisional Skin/Subcutaneous Tissue Debridement with a total area of 5.6 sq cm performed by Geralyn Corwin, DO. With the following instrument(s): Curette to remove Non-Viable tissue/material. Material removed includes Subcutaneous Tissue and Slough and after achieving pain control using Other (Benzocaine). No specimens were taken. A time out was conducted at 14:14, prior to the start of the procedure. A Minimum amount of bleeding was controlled with Pressure. The procedure was tolerated well. Post Debridement Measurements: 2.8cm length x 2cm width x 0.2cm depth; 0.88cm^3 volume. Character of Wound/Ulcer Post Debridement is stable. Severity of Tissue Post Debridement is: Fat layer exposed. Post procedure Diagnosis Wound #1: Same as Pre-Procedure Pre-procedure diagnosis of Wound #1 is a Diabetic Wound/Ulcer of the Lower Extremity located  on the Left Calcaneus . There was a Three Layer Compression Therapy Procedure by Antonieta Iba, RN. Post procedure Diagnosis Wound #1: Same as Pre-Procedure Plan Follow-up Appointments: Return Appointment in 1 week. - 03/12/22 @ 3:30pm with Dr. Mikey Bussing Lenn[place] Laity, Room 7) Bathing/ Shower/ Hygiene: May shower with protection but do not get wound dressing(s) wet. - May use cast protector bag from [facility], [facility] or [employer] Edema Control - Lymphedema / SCD / Other: Elevate legs to the level of the heart or above for 30 minutes daily and/or when sitting, a frequency of: Avoid standing for long periods of time. Additional Orders / Instructions: Follow Nutritious Diet - Monitor/Control Blood Sugar Home Health: New wound care orders this week; continue Home Health for wound care. May utilize formulary equivalent dressing for wound treatment orders unless otherwise specified. - Skilled nursing for wound care 1x and wrap changed in office 1x week. Other Home Health Orders/Instructions: - [facility] Radiology ordered were: X-ray, foot: Left Foot - Chronic left foot wound, r/o osteomyelitis WOUND #1: - Calcaneus Wound Laterality: Left Cleanser: Soap and Water (Home Health) 2 x Per Week/30 Days Discharge Instructions: May shower and wash wound with dial antibacterial soap and water prior to dressing change. Cleanser: Wound Cleanser 2 x Per Week/30 Days Discharge Instructions: Cleanse the wound with wound cleanser prior to applying a clean dressing using gauze sponges, not tissue  or cotton balls. Peri-Wound Care: Triamcinolone 15 (g) 2 x Per Week/30 Days Discharge Instructions: Use triamcinolone 15 (g) as directed Peri-Wound Care: Sween Lotion (Moisturizing lotion) (Home Health) 2 x Per Week/30 Days Discharge Instructions: Apply moisturizing lotion as directed Prim Dressing: Hydrofera Blue Ready Foam, 2.5 x2.5 in (Home Health) 2 x Per Week/30 Days ary Discharge Instructions: Apply to wound bed as  instructed Prim Dressing: Santyl Ointment 2 x Per Week/30 Days ary Discharge Instructions: **In Office Only** Secondary Dressing: ABD Pad, 8x10 (Home Health) 2 x Per Week/30 Days Discharge Instructions: Apply over primary dressing as directed. Com pression Wrap: ThreePress (3 layer compression wrap) (Home Health) 2 x Per Week/30 Days Discharge Instructions: Apply three layer compression as directed. 1. In office sharp debridement 2. Hydrofera Blue under 3 layer compression, santyl in office only 3. Follow-up in 1 week 4. X-ray Electronic Signature(s) Signed: 03/05/2022 3:07:46 PM By: Geralyn Corwin DO Entered By: Geralyn Corwin on 03/05/2022 15:07:08 -------------------------------------------------------------------------------- HxROS Details Patient Name: Date of Service: HUFFMA [name], [name]. 03/05/2022 1:15 PM Medical Record Number: [id number] Patient Account Number: [id number] Date of Birth/Sex: Treating RN: 1961/05/21 (61 y.o. [name] Primary Care Provider: [name] Other Clinician: Referring Provider: Treating Provider/Extender: [name] in Treatment: 0 Information Obtained From Patient Chart Eyes Complaints and Symptoms: Positive for: Glasses / Contacts Ear/Nose/Mouth/Throat Complaints and Symptoms: Negative for: Chronic sinus problems or rhinitis Genitourinary Complaints and Symptoms: Negative for: Frequent urination Hematologic/Lymphatic Medical History: Positive for: Anemia Respiratory Medical History: Positive for: Chronic Obstructive Pulmonary Disease (COPD) Cardiovascular Medical History: Positive for: Congestive Heart Failure - Weighs self daily; Hypertension; Myocardial Infarction; Peripheral Venous Disease Gastrointestinal Medical History: Past Medical History Notes: GERD, Peptic Ulcer Disease Endocrine Medical History: Positive for: Type II Diabetes Time with diabetes: 16 years Treated with:  Insulin, Oral agents Blood sugar tested every day: Yes Tested : Twice daily Immunological Integumentary (Skin) Musculoskeletal Medical History: Past Medical History Notes: Broke left leg 3 years ago, 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, foot fused Neurologic Medical History: Positive for: Neuropathy Oncologic Psychiatric Medical History: Past Medical History Notes: Depression Immunizations Pneumococcal Vaccine: Received Pneumococcal Vaccination: No Implantable Devices None Family and Social History Cancer: Yes - Father,Mother; Diabetes: Yes - Mother,Maternal Grandparents,Paternal Grandparents; Heart Disease: Yes - Siblings,Maternal Grandparents; Hypertension: Yes - Siblings; Tuberculosis: Yes - Maternal Grandparents,Paternal Grandparents; Current every day smoker; Marital Status - Single; Alcohol Use: Never; Drug Use: No History; Caffeine Use: Daily; Financial Concerns: No; Food, Clothing or Shelter Needs: No; Support System Lacking: No; Transportation Concerns: No Electronic Signature(s) Signed: 03/05/2022 3:07:46 PM By: Geralyn Corwin DO Signed: 03/05/2022 3:50:29 PM By: Antonieta Iba Entered By: Antonieta Iba on 03/05/2022 13:43:58 -------------------------------------------------------------------------------- SuperBill Details Patient Name: Date of Service: [name] Bushy C. 03/05/2022 Medical Record Number: [id number] Patient Account Number: [id number] Date of Birth/Sex: Treating RN: 08-22-1961 (61 y.o. [name] Primary Care Provider: [name] Other Clinician: Referring Provider: Treating Provider/Extender: [name] in Treatment: 0 Diagnosis Coding ICD-10 Codes Code Description [phone] Non-pressure chronic ulcer of other part of left foot with fat layer exposed E11.621 Type 2 diabetes mellitus with foot ulcer E66.01 Morbid (severe) obesity due to excess calories Facility Procedures CPT4 Code: [id number] Description: 99213 -  WOUND CARE VISIT-LEV 3 EST PT Modifier: 25 Quantity: 1 CPT4 Code: [id number] Description: 11042 - DEB SUBQ TISSUE 20 SQ CM/< ICD-10 Diagnosis Description L97.522 Non-pressure chronic ulcer of other part of left foot with fat layer exposed Modifier: Quantity: 1 Physician  Procedures : CPT4 Code Description Modifier [id number] [phone] - WC PHYS LEVEL 4 - NEW PT ICD-10 Diagnosis Description L97.522 Non-pressure chronic ulcer of other part of left foot with fat layer exposed E11.621 Type 2 diabetes mellitus with foot ulcer E66.01 Morbid  (severe) obesity due to excess calories Quantity: 1 : [id number] 11042 - WC PHYS SUBQ TISS 20 SQ CM ICD-10 Diagnosis Description L97.522 Non-pressure chronic ulcer of other part of left foot with fat layer exposed Quantity: 1 Electronic Signature(s) Signed: 03/05/2022 3:07:46 PM By: Geralyn Corwin DO Entered By: Geralyn Corwin on 03/05/2022 15:07:25

## 2022-03-12 ENCOUNTER — Encounter (HOSPITAL_BASED_OUTPATIENT_CLINIC_OR_DEPARTMENT_OTHER): Payer: 59 | Admitting: Internal Medicine

## 2022-03-12 DIAGNOSIS — L97522 Non-pressure chronic ulcer of other part of left foot with fat layer exposed: Secondary | ICD-10-CM | POA: Diagnosis not present

## 2022-03-12 NOTE — Progress Notes (Signed)
Swartzlander, Staci C. ([id number]) Visit Report for 03/12/2022 Debridement Details Patient Name: Date of Service: [name] 03/12/2022 3:30 PM Medical Record Number: [id number] Patient Account Number: [id number] Date of Birth/Sex: Treating RN: 03/23/1961 (61 y.o. [name] Primary Care Provider: [name] Other Clinician: Referring Provider: Treating Provider/Extender: Renaldo Reel in Treatment: 1 Debridement Performed for Assessment: Wound #1 Left Calcaneus Performed By: Physician Maxwell Caul., MD Debridement Type: Debridement Severity of Tissue Pre Debridement: Fat layer exposed Level of Consciousness (Pre-procedure): Awake and Alert Pre-procedure Verification/Time Out Yes - 16:01 Taken: Start Time: 16:02 Pain Control: Other : benzocaine 20% T Area Debrided (L x W): otal 2.3 (cm) x 1.4 (cm) = 3.22 (cm) Tissue and other material debrided: Non-Viable, Slough, Subcutaneous, Slough Level: Skin/Subcutaneous Tissue Debridement Description: Excisional Instrument: Curette Bleeding: Minimum Hemostasis Achieved: Pressure End Time: 16:09 Response to Treatment: Procedure was tolerated well Level of Consciousness (Post- Awake and Alert procedure): Post Debridement Measurements of Total Wound Length: (cm) 2.3 Width: (cm) 1.4 Depth: (cm) 0.2 Volume: (cm) 0.506 Character of Wound/Ulcer Post Debridement: Stable Severity of Tissue Post Debridement: Fat layer exposed Post Procedure Diagnosis Same as Pre-procedure Electronic Signature(s) Signed: 03/12/2022 4:23:09 PM By: Baltazar Najjar MD Signed: 03/12/2022 4:46:06 PM By: Antonieta Iba Entered By: Baltazar Najjar on 03/12/2022 16:13:57 -------------------------------------------------------------------------------- HPI Details Patient Name: Date of Service: [name] Bushy C. 03/12/2022 3:30 PM Medical Record Number: [id number] Patient Account Number: [id number] Date of Birth/Sex: Treating  RN: 07-Feb-1961 (61 y.o. [name] Primary Care Provider: [name] Other Clinician: Referring Provider: Treating Provider/Extender: Renaldo Reel in Treatment: 1 History of Present Illness HPI Description: Admission 03/05/2022 Ms. Rosealyn Neumeyer is a 61 year old female with a past medical history of uncontrolled insulin-dependent type 2 diabetes, tobacco user and chronic diastolic heart failure that presents to the clinic for a 33-month history of wound to her left heel. She states she had a left ankle fusion in September 2022. She states that she always had a wound after the surgery and it never healed. She has home health and they have been doing compression wraps along with silver alginate to the wound bed. She currently denies signs of infection. 6/8; this is a 61 year old woman with type 2 diabetes. She developed a wound on her left Achilles heel just above the tip of the heel in the setting of recurrent ankle surgeries in late 2022. I have seen some of these results from either the cast or the surgical boots that are put on after these operations. She thinks this may be the case. And a sense of pressure ulcer. In any case that she has been using Santyl Hydrofera Blue under compression. She is not wearing any footwear at home as she cannot find anything to accommodate the wrap Electronic Signature(s) Signed: 03/12/2022 4:23:09 PM By: Baltazar Najjar MD Entered By: Baltazar Najjar on 03/12/2022 16:15:31 -------------------------------------------------------------------------------- Physical Exam Details Patient Name: Date of Service: [name] Bushy C. 03/12/2022 3:30 PM Medical Record Number: [id number] Patient Account Number: [id number] Date of Birth/Sex: Treating RN: 08/19/1961 (61 y.o. [name] Primary Care Provider: [name] Other Clinician: Referring Provider: Treating Provider/Extender: Renaldo Reel  in Treatment: 1 Constitutional . Patient is hypertensive.. Pulse regular and within target range for patient.Marland Kitchen Respirations regular, non-labored and within target range.. Temperature is normal and within the target range for the patient.Marland Kitchen Appears in no distress. Cardiovascular Pedal pulses are palpableOn the left. Notes Wound exam;  left Achilles heel just near the tip of the heel. Under illumination still some debris on the surface very gritty using #3 curette I remove this granulation looks better. Rim of epithelialization around the edges. There is no evidence of infection Electronic Signature(s) Signed: 03/12/2022 4:23:09 PM By: Baltazar Najjar MD Entered By: Baltazar Najjar on 03/12/2022 16:20:31 -------------------------------------------------------------------------------- Physician Orders Details Patient Name: Date of Service: [name] Bushy C. 03/12/2022 3:30 PM Medical Record Number: [id number] Patient Account Number: [id number] Date of Birth/Sex: Treating RN: [birth year] (61 y.o. [name] Primary Care Provider: [name] Other Clinician: Referring Provider: Treating Provider/Extender: Renaldo Reel in Treatment: 1 Verbal / Phone Orders: No Diagnosis Coding ICD-10 Coding Code Description [phone] Non-pressure chronic ulcer of other part of left foot with fat layer exposed E11.621 Type 2 diabetes mellitus with foot ulcer E66.01 Morbid (severe) obesity due to excess calories Follow-up Appointments ppointment in 1 week. - 03/17/22 @ 3:15pm with Dr. Mikey Bussing Maryruth Bun, Room 9) Return A 03/26/22 @ 3:30pm with Dr. Mikey Bussing Lennox Laity, Room 7) Bathing/ Shower/ Hygiene May shower with protection but do not get wound dressing(s) wet. - May use cast protector bag from [facility], [facility] or [employer] Edema Control - Lymphedema / SCD / Other Elevate legs to the level of the heart or above for 30 minutes daily and/or when sitting, a frequency of: Avoid standing  for long periods of time. Additional Orders / Instructions Follow Nutritious Diet - Monitor/Control Blood Sugar Home Health No change in wound care orders this week; continue Home Health for wound care. May utilize formulary equivalent dressing for wound treatment orders unless otherwise specified. - Skilled nursing for wound care 1x and wrap changed in office 1x week. Other Home Health Orders/Instructions: - [facility] Wound Treatment Wound #1 - Calcaneus Wound Laterality: Left Cleanser: Soap and Water (Home Health) 2 x Per Week/30 Days Discharge Instructions: May shower and wash wound with dial antibacterial soap and water prior to dressing change. Cleanser: Wound Cleanser 2 x Per Week/30 Days Discharge Instructions: Cleanse the wound with wound cleanser prior to applying a clean dressing using gauze sponges, not tissue or cotton balls. Peri-Wound Care: Triamcinolone 15 (g) 2 x Per Week/30 Days Discharge Instructions: Use triamcinolone 15 (g) as directed Peri-Wound Care: Sween Lotion (Moisturizing lotion) (Home Health) 2 x Per Week/30 Days Discharge Instructions: Apply moisturizing lotion as directed Prim Dressing: Hydrofera Blue Ready Foam, 2.5 x2.5 in (Home Health) 2 x Per Week/30 Days ary Discharge Instructions: Apply to wound bed as instructed Prim Dressing: Santyl Ointment 2 x Per Week/30 Days ary Discharge Instructions: **In Office Only** Secondary Dressing: ABD Pad, 8x10 (Home Health) 2 x Per Week/30 Days Discharge Instructions: Apply over primary dressing as directed. Compression Wrap: ThreePress (3 layer compression wrap) (Home Health) 2 x Per Week/30 Days Discharge Instructions: Apply three layer compression as directed. Electronic Signature(s) Signed: 03/12/2022 4:23:09 PM By: Baltazar Najjar MD Signed: 03/12/2022 4:46:06 PM By: Antonieta Iba Entered By: Antonieta Iba on 03/12/2022  16:14:13 -------------------------------------------------------------------------------- Problem List Details Patient Name: Date of Service: [name] Bushy C. 03/12/2022 3:30 PM Medical Record Number: [id number] Patient Account Number: [id number] Date of Birth/Sex: Treating RN: 1961/03/22 (61 y.o. [name] Primary Care Provider: [name] Other Clinician: Referring Provider: Treating Provider/Extender: Renaldo Reel in Treatment: 1 Active Problems ICD-10 Encounter Code Description Active Date MDM Diagnosis [phone] Non-pressure chronic ulcer of other part of left foot with fat layer exposed 03/05/2022 No Yes E11.621 Type 2 diabetes  mellitus with foot ulcer 03/05/2022 No Yes E66.01 Morbid (severe) obesity due to excess calories 03/05/2022 No Yes Inactive Problems Resolved Problems Electronic Signature(s) Signed: 03/12/2022 4:23:09 PM By: Baltazar Najjar MD Previous Signature: 03/12/2022 3:49:46 PM Version By: Antonieta Iba Entered By: Baltazar Najjar on 03/12/2022 16:13:43 -------------------------------------------------------------------------------- Progress Note Details Patient Name: Date of Service: [name] Bushy C. 03/12/2022 3:30 PM Medical Record Number: [id number] Patient Account Number: [id number] Date of Birth/Sex: Treating RN: 18-Jul-1961 (61 y.o. [name] Primary Care Provider: [name] Other Clinician: Referring Provider: Treating Provider/Extender: Renaldo Reel in Treatment: 1 Subjective History of Present Illness (HPI) Admission 03/05/2022 Ms. Sarea Gassen is a 61 year old female with a past medical history of uncontrolled insulin-dependent type 2 diabetes, tobacco user and chronic diastolic heart failure that presents to the clinic for a 48-month history of wound to her left heel. She states she had a left ankle fusion in September 2022. She states that she always had a wound after the  surgery and it never healed. She has home health and they have been doing compression wraps along with silver alginate to the wound bed. She currently denies signs of infection. 6/8; this is a 61 year old woman with type 2 diabetes. She developed a wound on her left Achilles heel just above the tip of the heel in the setting of recurrent ankle surgeries in late 2022. I have seen some of these results from either the cast or the surgical boots that are put on after these operations. She thinks this may be the case. And a sense of pressure ulcer. In any case that she has been using Santyl Hydrofera Blue under compression. She is not wearing any footwear at home as she cannot find anything to accommodate the wrap Objective Constitutional Patient is hypertensive.. Pulse regular and within target range for patient.Marland Kitchen Respirations regular, non-labored and within target range.. Temperature is normal and within the target range for the patient.Marland Kitchen Appears in no distress. Vitals Time Taken: 3:28 PM, Height: 66 in, Weight: 339 lbs, BMI: 54.7, Temperature: 98.5 F, Pulse: 85 bpm, Respiratory Rate: 20 breaths/min, Blood Pressure: 161/76 mmHg, Capillary Blood Glucose: 276 mg/dl. Cardiovascular Pedal pulses are palpableOn the left. General Notes: Wound exam; left Achilles heel just near the tip of the heel. Under illumination still some debris on the surface very gritty using #3 curette I remove this granulation looks better. Rim of epithelialization around the edges. There is no evidence of infection Integumentary (Hair, Skin) Wound #1 status is Open. Original cause of wound was Other Lesion. The date acquired was: 10/06/2021. The wound has been in treatment 1 weeks. The wound is located on the Left Calcaneus. The wound measures 2.3cm length x 1.4cm width x 0.2cm depth; 2.529cm^2 area and 0.506cm^3 volume. There is Fat Layer (Subcutaneous Tissue) exposed. There is a medium amount of serosanguineous drainage noted.  The wound margin is distinct with the outline attached to the wound base. There is medium (34-66%) pink granulation within the wound bed. There is a medium (34-66%) amount of necrotic tissue within the wound bed including Adherent Slough. Assessment Active Problems ICD-10 Non-pressure chronic ulcer of other part of left foot with fat layer exposed Type 2 diabetes mellitus with foot ulcer Morbid (severe) obesity due to excess calories Procedures Wound #1 Pre-procedure diagnosis of Wound #1 is a Diabetic Wound/Ulcer of the Lower Extremity located on the Left Calcaneus .Severity of Tissue Pre Debridement is: Fat layer exposed. There was a Excisional Skin/Subcutaneous Tissue Debridement with  a total area of 3.22 sq cm performed by Maxwell Caul., MD. With the following instrument(s): Curette to remove Non-Viable tissue/material. Material removed includes Subcutaneous Tissue and Slough and after achieving pain control using Other (benzocaine 20%). No specimens were taken. A time out was conducted at 16:01, prior to the start of the procedure. A Minimum amount of bleeding was controlled with Pressure. The procedure was tolerated well. Post Debridement Measurements: 2.3cm length x 1.4cm width x 0.2cm depth; 0.506cm^3 volume. Character of Wound/Ulcer Post Debridement is stable. Severity of Tissue Post Debridement is: Fat layer exposed. Post procedure Diagnosis Wound #1: Same as Pre-Procedure Pre-procedure diagnosis of Wound #1 is a Diabetic Wound/Ulcer of the Lower Extremity located on the Left Calcaneus . There was a Three Layer Compression Therapy Procedure by Antonieta Iba, RN. Post procedure Diagnosis Wound #1: Same as Pre-Procedure Plan Follow-up Appointments: Return Appointment in 1 week. - 03/17/22 @ 3:15pm with Dr. Mikey Bussing Maryruth Bun, Room 9) 03/26/22 @ 3:30pm with Dr. Mikey Bussing Lennox Laity, Room 7) Bathing/ Shower/ Hygiene: May shower with protection but do not get wound dressing(s) wet. - May use  cast protector bag from [facility], [facility] or [employer] Edema Control - Lymphedema / SCD / Other: Elevate legs to the level of the heart or above for 30 minutes daily and/or when sitting, a frequency of: Avoid standing for long periods of time. Additional Orders / Instructions: Follow Nutritious Diet - Monitor/Control Blood Sugar Home Health: No change in wound care orders this week; continue Home Health for wound care. May utilize formulary equivalent dressing for wound treatment orders unless otherwise specified. - Skilled nursing for wound care 1x and wrap changed in office 1x week. Other Home Health Orders/Instructions: - [facility] WOUND #1: - Calcaneus Wound Laterality: Left Cleanser: Soap and Water (Home Health) 2 x Per Week/30 Days Discharge Instructions: May shower and wash wound with dial antibacterial soap and water prior to dressing change. Cleanser: Wound Cleanser 2 x Per Week/30 Days Discharge Instructions: Cleanse the wound with wound cleanser prior to applying a clean dressing using gauze sponges, not tissue or cotton balls. Peri-Wound Care: Triamcinolone 15 (g) 2 x Per Week/30 Days Discharge Instructions: Use triamcinolone 15 (g) as directed Peri-Wound Care: Sween Lotion (Moisturizing lotion) (Home Health) 2 x Per Week/30 Days Discharge Instructions: Apply moisturizing lotion as directed Prim Dressing: Hydrofera Blue Ready Foam, 2.5 x2.5 in (Home Health) 2 x Per Week/30 Days ary Discharge Instructions: Apply to wound bed as instructed Prim Dressing: Santyl Ointment 2 x Per Week/30 Days ary Discharge Instructions: **In Office Only** Secondary Dressing: ABD Pad, 8x10 (Home Health) 2 x Per Week/30 Days Discharge Instructions: Apply over primary dressing as directed. Com pression Wrap: ThreePress (3 layer compression wrap) (Home Health) 2 x Per Week/30 Days Discharge Instructions: Apply three layer compression as directed. 1. We will use Santyl hydrogel Hydrofera Blue under 3  layer compression 2. Gave her a surgical sandal 3. Instructions to be careful about putting any pressure on this wounded area 4. She did not do the x-ray that was ordered last week she lives in [place] and has limited transportation. May need to wait till next week when she comes into the clinic care Electronic Signature(s) Signed: 03/12/2022 4:23:09 PM By: Baltazar Najjar MD Entered By: Baltazar Najjar on 03/12/2022 16:21:23 -------------------------------------------------------------------------------- SuperBill Details Patient Name: Date of Service: [name] Bushy C. 03/12/2022 Medical Record Number: [id number] Patient Account Number: [id number] Date of Birth/Sex: Treating RN: [birth year] (61 y.o. [name] Primary Care Provider: Orvan Falconer,  Jeannett SeniorStephen Other Clinician: Referring Provider: Treating Provider/Extender: Renaldo Reelobson, Xinyi Batton Campbell, Stephen Weeks in Treatment: 1 Diagnosis Coding ICD-10 Codes Code Description [id number] Non-pressure chronic ulcer of other part of left foot with fat layer exposed E11.621 Type 2 diabetes mellitus with foot ulcer E66.01 Morbid (severe) obesity due to excess calories Facility Procedures CPT4 Code: [id number] Description: 11042 - DEB SUBQ TISSUE 20 SQ CM/< ICD-10 Diagnosis Description L97.522 Non-pressure chronic ulcer of other part of left foot with fat layer exposed Modifier: Quantity: 1 Physician Procedures : CPT4 Code Description Modifier [id number] 11042 - WC PHYS SUBQ TISS 20 SQ CM ICD-10 Diagnosis Description L97.522 Non-pressure chronic ulcer of other part of left foot with fat layer exposed Quantity: 1 Electronic Signature(s) Signed: 03/12/2022 4:23:09 PM By: Baltazar Najjarobson, Shakiyla Kook MD Entered By: Baltazar Najjarobson, Beverly Suriano on 03/12/2022 16:21:31

## 2022-03-17 ENCOUNTER — Encounter (HOSPITAL_BASED_OUTPATIENT_CLINIC_OR_DEPARTMENT_OTHER): Payer: 59 | Admitting: Internal Medicine

## 2022-03-17 NOTE — Progress Notes (Signed)
Minner, Ellyce C. ([id number]) Visit Report for 03/12/2022 Arrival Information Details Patient Name: Date of Service: [name] 03/12/2022 3:30 PM Medical Record Number: [id number] Patient Account Number: [id number] Date of Birth/Sex: Treating RN: 14-Dec-1960 (61 y.o. [name], [name] Primary Care Jayshun Galentine: Jenean Lindau Other Clinician: Referring Mckaylah Bettendorf: Treating Leeasia Secrist/Extender: Erasmo Downer in Treatment: 1 Visit Information History Since Last Visit Added or deleted any medications: No Patient Arrived: Wheel Chair Any new allergies or adverse reactions: No Arrival Time: 15:27 Had a fall or experienced change in No Accompanied By: family member activities of daily living that may affect Transfer Assistance: Manual risk of falls: Patient Identification Verified: Yes Signs or symptoms of abuse/neglect since last visito No Secondary Verification Process Completed: Yes Hospitalized since last visit: No Patient Requires Transmission-Based Precautions: No Implantable device outside of the clinic excluding No Patient Has Alerts: No cellular tissue based products placed in the center since last visit: Has Dressing in Place as Prescribed: Yes Has Compression in Place as Prescribed: Yes Pain Present Now: Yes Electronic Signature(s) Signed: 03/12/2022 4:29:12 PM By: [name] Pilling RN, BSN Entered By: [name] on 03/12/2022 15:27:50 -------------------------------------------------------------------------------- Compression Therapy Details Patient Name: Date of Service: [name] Lax C. 03/12/2022 3:30 PM Medical Record Number: [id number] Patient Account Number: [id number] Date of Birth/Sex: Treating RN: 1960-12-18 (61 y.o. [name] Primary Care Karolynn Infantino: Jenean Lindau Other Clinician: Referring Giann Obara: Treating Daryl Beehler/Extender: Erasmo Downer in Treatment: 1 Compression Therapy Performed for Wound  Assessment: Wound #1 Left Calcaneus Performed By: Clinician Lorrin Jackson, RN Compression Type: Three Layer Post Procedure Diagnosis Same as Pre-procedure Electronic Signature(s) Signed: 03/12/2022 4:46:06 PM By: Lorrin Jackson Entered By: Lorrin Jackson on 03/12/2022 16:09:37 -------------------------------------------------------------------------------- Encounter Discharge Information Details Patient Name: Date of Service: [name] Lax C. 03/12/2022 3:30 PM Medical Record Number: [id number] Patient Account Number: [id number] Date of Birth/Sex: Treating RN: 07-27-1961 (61 y.o. [name] Primary Care Canaan Holzer: Jenean Lindau Other Clinician: Referring Johann Gascoigne: Treating Elmo Rio/Extender: Erasmo Downer in Treatment: 1 Encounter Discharge Information Items Post Procedure Vitals Discharge Condition: Stable Temperature (F): 98.5 Ambulatory Status: Wheelchair Pulse (bpm): 85 Discharge Destination: Home Respiratory Rate (breaths/min): 20 Transportation: Private Auto Blood Pressure (mmHg): 161/76 Schedule Follow-up Appointment: Yes Clinical Summary of Care: Provided on 03/12/2022 Form Type Recipient Paper Patient Patient Electronic Signature(s) Signed: 03/12/2022 4:46:06 PM By: Lorrin Jackson Entered By: Lorrin Jackson on 03/12/2022 16:31:06 -------------------------------------------------------------------------------- Lower Extremity Assessment Details Patient Name: Date of Service: [name] Lax C. 03/12/2022 3:30 PM Medical Record Number: [id number] Patient Account Number: [id number] Date of Birth/Sex: Treating RN: 1961/08/20 (61 y.o. [name] Primary Care Demitria Hay: Jenean Lindau Other Clinician: Referring Omarrion Carmer: Treating Kahliya Fraleigh/Extender: Erasmo Downer in Treatment: 1 Edema Assessment Assessed: Shirlyn Goltz: No] Patrice Paradise: No] Edema: [Left: Ye] [Right: s] Calf Left: Right: Point of Measurement:  28 cm From Medial Instep 50.6 cm Ankle Left: Right: Point of Measurement: 3 cm From Medial Instep 34 cm Vascular Assessment Pulses: Dorsalis Pedis Palpable: [Left:Yes] Electronic Signature(s) Signed: 03/12/2022 4:46:06 PM By: Lorrin Jackson Signed: 03/17/2022 8:46:10 AM By: Erenest Blank Entered By: Erenest Blank on 03/12/2022 15:38:29 -------------------------------------------------------------------------------- Multi Wound Chart Details Patient Name: Date of Service: [name] Lax C. 03/12/2022 3:30 PM Medical Record Number: [id number] Patient Account Number: [id number] Date of Birth/Sex: Treating RN: 1961-05-09 (61 y.o. [name] Primary Care Demir Titsworth: Jenean Lindau Other Clinician: Referring Zamire Whitehurst: Treating Xylon Croom/Extender: Erasmo Downer in Treatment:  1 Vital Signs Height(in): 66 Capillary Blood Glucose(mg/dl): 276 Weight(lbs): 339 Pulse(bpm): 85 Body Mass Index(BMI): 54.7 Blood Pressure(mmHg): 161/76 Temperature(F): 98.5 Respiratory Rate(breaths/min): 20 Photos: [N/A:N/A] Left Calcaneus N/A N/A Wound Location: Other Lesion N/A N/A Wounding Event: Diabetic Wound/Ulcer of the Lower N/A N/A Primary Etiology: Extremity Anemia, Chronic Obstructive N/A N/A Comorbid History: Pulmonary Disease (COPD), Congestive Heart Failure, Hypertension, Myocardial Infarction, Peripheral Venous Disease, Type II Diabetes, Neuropathy 10/06/2021 N/A N/A Date Acquired: 1 N/A N/A Weeks of Treatment: Open N/A N/A Wound Status: No N/A N/A Wound Recurrence: 2.3x1.4x0.2 N/A N/A Measurements L x W x D (cm) 2.529 N/A N/A A (cm) : rea 0.506 N/A N/A Volume (cm) : 42.50% N/A N/A % Reduction in A rea: 42.50% N/A N/A % Reduction in Volume: Grade 1 N/A N/A Classification: Medium N/A N/A Exudate A mount: Serosanguineous N/A N/A Exudate Type: red, brown N/A N/A Exudate Color: Distinct, outline attached N/A N/A Wound  Margin: Medium (34-66%) N/A N/A Granulation A mount: Pink N/A N/A Granulation Quality: Medium (34-66%) N/A N/A Necrotic A mount: Fat Layer (Subcutaneous Tissue): Yes N/A N/A Exposed Structures: Fascia: No Tendon: No Muscle: No Joint: No Bone: No None N/A N/A Epithelialization: Debridement - Excisional N/A N/A Debridement: Pre-procedure Verification/Time Out 16:01 N/A N/A Taken: Other N/A N/A Pain Control: Subcutaneous, Slough N/A N/A Tissue Debrided: Skin/Subcutaneous Tissue N/A N/A Level: 3.22 N/A N/A Debridement A (sq cm): rea Curette N/A N/A Instrument: Minimum N/A N/A Bleeding: Pressure N/A N/A Hemostasis A chieved: Procedure was tolerated well N/A N/A Debridement Treatment Response: 2.3x1.4x0.2 N/A N/A Post Debridement Measurements L x W x D (cm) 0.506 N/A N/A Post Debridement Volume: (cm) Compression Therapy N/A N/A Procedures Performed: Debridement Treatment Notes Electronic Signature(s) Signed: 03/12/2022 4:23:09 PM By: Linton Ham MD Signed: 03/12/2022 4:46:06 PM By: Lorrin Jackson Entered By: Linton Ham on 03/12/2022 16:13:50 -------------------------------------------------------------------------------- Multi-Disciplinary Care Plan Details Patient Name: Date of Service: [name] Lax C. 03/12/2022 3:30 PM Medical Record Number: [id number] Patient Account Number: [id number] Date of Birth/Sex: Treating RN: Nov 07, 1960 (61 y.o. [name] Primary Care Yeny Schmoll: Jenean Lindau Other Clinician: Referring Mylissa Lambe: Treating Wymon Swaney/Extender: Erasmo Downer in Treatment: 1 Active Inactive Abuse / Safety / Falls / Self Care Management Nursing Diagnoses: History of Falls Goals: Patient will not experience any injury related to falls Date Initiated: 03/05/2022 Target Resolution Date: 04/02/2022 Goal Status: Active Interventions: Assess fall risk on admission and as needed Assess: immobility, friction,  shearing, incontinence upon admission and as needed Assess impairment of mobility on admission and as needed per policy Notes: Wound/Skin Impairment Nursing Diagnoses: Impaired tissue integrity Goals: Patient/caregiver will verbalize understanding of skin care regimen Date Initiated: 03/05/2022 Target Resolution Date: 04/02/2022 Goal Status: Active Ulcer/skin breakdown will have a volume reduction of 30% by week 4 Date Initiated: 03/05/2022 Target Resolution Date: 04/02/2022 Goal Status: Active Interventions: Assess patient/caregiver ability to obtain necessary supplies Assess patient/caregiver ability to perform ulcer/skin care regimen upon admission and as needed Assess ulceration(s) every visit Provide education on smoking Provide education on ulcer and skin care Treatment Activities: Topical wound management initiated : 03/05/2022 Notes: Electronic Signature(s) Signed: 03/12/2022 3:49:59 PM By: Lorrin Jackson Signed: 03/12/2022 3:49:59 PM By: Lorrin Jackson Entered By: Lorrin Jackson on 03/12/2022 15:49:59 -------------------------------------------------------------------------------- Pain Assessment Details Patient Name: Date of Service: [name] Lax C. 03/12/2022 3:30 PM Medical Record Number: [id number] Patient Account Number: [id number] Date of Birth/Sex: Treating RN: 05-24-1961 (61 y.o. [name] Primary Care Beatric Fulop: Jenean Lindau Other Clinician: Referring Khaled Herda:  Treating Millicent Blazejewski/Extender: Renaldo Reel in Treatment: 1 Active Problems Location of Pain Severity and Description of Pain Patient Has Paino Yes Site Locations Pain Location: Pain in Ulcers Rate the pain. Current Pain Level: 7 Worst Pain Level: 10 Least Pain Level: 0 Tolerable Pain Level: 7 Pain Management and Medication Current Pain Management: Medication: No Cold Application: No Rest: No Massage: No Activity: No T.E.N.S.: No Heat Application: No Leg drop  or elevation: No Is the Current Pain Management Adequate: Adequate How does your wound impact your activities of daily livingo Sleep: No Bathing: No Appetite: No Relationship With Others: No Bladder Continence: No Emotions: No Bowel Continence: No Work: No Toileting: No Drive: No Dressing: No Hobbies: No Notes per patient pain in wound. Electronic Signature(s) Signed: 03/12/2022 4:29:12 PM By: Shawn Stall RN, BSN Entered By: Shawn Stall on 03/12/2022 15:28:31 -------------------------------------------------------------------------------- Patient/Caregiver Education Details Patient Name: Date of Service: [name] 6/8/2023andnbsp3:30 PM Medical Record Number: [id number] Patient Account Number: [id number] Date of Birth/Gender: Treating RN: 13-Jul-1961 (61 y.o. [name] Primary Care Physician: [name] Other Clinician: Referring Physician: Treating Physician/Extender: Renaldo Reel in Treatment: 1 Education Assessment Education Provided To: Patient Education Topics Provided Smoking and Wound Healing: Methods: Explain/Verbal, Printed Responses: State content correctly Venous: Methods: Explain/Verbal, Printed Responses: State content correctly Wound/Skin Impairment: Methods: Explain/Verbal, Printed Responses: State content correctly Electronic Signature(s) Signed: 03/12/2022 4:46:06 PM By: Antonieta Iba Entered By: Antonieta Iba on 03/12/2022 15:50:22 -------------------------------------------------------------------------------- Wound Assessment Details Patient Name: Date of Service: [name] 03/12/2022 3:30 PM Medical Record Number: [id number] Patient Account Number: [id number] Date of Birth/Sex: Treating RN: Jun 26, 1961 (61 y.o. [name] Primary Care Rhys Anchondo: [name] Other Clinician: Referring Kemoni Quesenberry: Treating Tajah Noguchi/Extender: Renaldo Reel in  Treatment: 1 Wound Status Wound Number: 1 Primary Diabetic Wound/Ulcer of the Lower Extremity Etiology: Wound Location: Left Calcaneus Wound Open Wounding Event: Other Lesion Status: Date Acquired: 10/06/2021 Comorbid Anemia, Chronic Obstructive Pulmonary Disease (COPD), Weeks Of Treatment: 1 History: Congestive Heart Failure, Hypertension, Myocardial Infarction, Clustered Wound: No Peripheral Venous Disease, Type II Diabetes, Neuropathy Photos Wound Measurements Length: (cm) 2.3 Width: (cm) 1.4 Depth: (cm) 0.2 Area: (cm) 2.529 Volume: (cm) 0.506 % Reduction in Area: 42.5% % Reduction in Volume: 42.5% Epithelialization: None Wound Description Classification: Grade 1 Wound Margin: Distinct, outline attached Exudate Amount: Medium Exudate Type: Serosanguineous Exudate Color: red, brown Foul Odor After Cleansing: No Slough/Fibrino Yes Wound Bed Granulation Amount: Medium (34-66%) Exposed Structure Granulation Quality: Pink Fascia Exposed: No Necrotic Amount: Medium (34-66%) Fat Layer (Subcutaneous Tissue) Exposed: Yes Necrotic Quality: Adherent Slough Tendon Exposed: No Muscle Exposed: No Joint Exposed: No Bone Exposed: No Treatment Notes Wound #1 (Calcaneus) Wound Laterality: Left Cleanser Soap and Water Discharge Instruction: May shower and wash wound with dial antibacterial soap and water prior to dressing change. Wound Cleanser Discharge Instruction: Cleanse the wound with wound cleanser prior to applying a clean dressing using gauze sponges, not tissue or cotton balls. Peri-Wound Care Triamcinolone 15 (g) Discharge Instruction: Use triamcinolone 15 (g) as directed Sween Lotion (Moisturizing lotion) Discharge Instruction: Apply moisturizing lotion as directed Topical Primary Dressing Hydrofera Blue Ready Foam, 2.5 x2.5 in Discharge Instruction: Apply to wound bed as instructed Santyl Ointment Discharge Instruction: **In Office Only** Secondary  Dressing ABD Pad, 8x10 Discharge Instruction: Apply over primary dressing as directed. Secured With Compression Wrap ThreePress (3 layer compression wrap) Discharge Instruction: Apply three layer compression as directed. Compression Stockings Add-Ons Electronic  Signature(s) Signed: 03/12/2022 4:46:06 PM By: Lorrin Jackson Signed: 03/17/2022 8:46:10 AM By: Erenest Blank Entered By: Erenest Blank on 03/12/2022 15:42:10 -------------------------------------------------------------------------------- Vitals Details Patient Name: Date of Service: [name] Lax C. 03/12/2022 3:30 PM Medical Record Number: [id number] Patient Account Number: [id number] Date of Birth/Sex: Treating RN: [birth year] (61 y.o. [name], [name] Primary Care Ellinor Test: Jenean Lindau Other Clinician: Referring Gertrue Willette: Treating Kenyon Eichelberger/Extender: Erasmo Downer in Treatment: 1 Vital Signs Time Taken: 15:28 Temperature (F): 98.5 Height (in): 66 Pulse (bpm): 85 Weight (lbs): 339 Respiratory Rate (breaths/min): 20 Body Mass Index (BMI): 54.7 Blood Pressure (mmHg): 161/76 Capillary Blood Glucose (mg/dl): 276 Reference Range: 80 - 120 mg / dl Electronic Signature(s) Signed: 03/12/2022 4:29:12 PM By: [name] Pilling RN, BSN Entered By: [name] on 03/12/2022 15:28:08

## 2022-03-26 ENCOUNTER — Encounter (HOSPITAL_BASED_OUTPATIENT_CLINIC_OR_DEPARTMENT_OTHER): Payer: 59 | Admitting: Internal Medicine

## 2022-03-26 DIAGNOSIS — L97522 Non-pressure chronic ulcer of other part of left foot with fat layer exposed: Secondary | ICD-10-CM

## 2022-03-26 NOTE — Progress Notes (Signed)
Cardinal, [name] ([id number]) Visit Report for 03/26/2022 Chief Complaint Document Details Patient Name: Date of Service: [name] 03/26/2022 3:30 PM Medical Record Number: [id number] Patient Account Number: [id number] Date of Birth/Sex: Treating RN: August 07, 1961 (61 y.o. [name] Primary Care Provider: Junious [name] Other Clinician: Referring Provider: Treating Provider/Extender: [name] in [name]: 3 Information Obtained from: Patient Chief Complaint 03/05/2022; left foot wound Electronic Signature(s) Signed: 03/26/2022 4:58:39 PM By: [name] Corwin [name] Entered By: [name] on 03/26/2022 16:32:34 -------------------------------------------------------------------------------- Debridement Details Patient Name: Date of Service: [name] Bushy C. 03/26/2022 3:30 PM Medical Record Number: [id number] Patient Account Number: [id number] Date of Birth/Sex: Treating RN: 05/15/1961 (61 y.o. [name], [name] Primary Care Provider: Junious [name] Other Clinician: Referring Provider: Treating Provider/Extender: [name] in [name]: 3 Debridement Performed for Assessment: Wound #1 Left Calcaneus Performed By: Physician [name] Corwin, [name] Debridement Type: Debridement Severity of Tissue Pre Debridement: Fat layer exposed Level of Consciousness (Pre-procedure): Awake and Alert Pre-procedure Verification/Time Out Yes - 15:40 Taken: Start Time: 15:41 Pain Control: Lidocaine 5% topical ointment T Area Debrided (L x W): otal 1.8 (cm) x 0.7 (cm) = 1.26 (cm) Tissue and other material debrided: Viable, Slough, Subcutaneous, Slough, Hyper-granulation Level: Skin/Subcutaneous Tissue Debridement Description: Excisional Instrument: Curette Bleeding: Minimum Hemostasis Achieved: Silver Nitrate End Time: 15:48 Procedural Pain: 0 Post Procedural Pain: 0 Response to [name]: Procedure was tolerated  well Level of Consciousness (Post- Awake and Alert procedure): Post Debridement Measurements of Total Wound Length: (cm) 1.8 Width: (cm) 0.7 Depth: (cm) 0.1 Volume: (cm) 0.099 Character of Wound/Ulcer Post Debridement: Improved Severity of Tissue Post Debridement: Fat layer exposed Post Procedure Diagnosis Same as Pre-procedure Electronic Signature(s) Signed: 03/26/2022 4:58:39 PM By: [name] Corwin [name] Signed: 03/26/2022 5:10:40 PM By: [name] Stall RN, BSN Entered By: [name] on 03/26/2022 15:51:27 -------------------------------------------------------------------------------- HPI Details Patient Name: Date of Service: [name] Bushy C. 03/26/2022 3:30 PM Medical Record Number: [id number] Patient Account Number: [id number] Date of Birth/Sex: Treating RN: 1960/12/14 (61 y.o. [name] Primary Care Provider: Junious [name] Other Clinician: Referring Provider: Treating Provider/Extender: [name] in [name]: 3 History of Present Illness HPI Description: Admission 03/05/2022 [name] is a 61 year old female with a past medical history of uncontrolled insulin-dependent type 2 diabetes, tobacco user and chronic diastolic heart failure that presents to the clinic for a 75-month history of wound to her left heel. She states she had a left ankle fusion in September 2022. She states that she always had a wound after the surgery and it never healed. She has home health and they have been doing compression wraps along with silver alginate to the wound bed. She currently denies signs of infection. 6/8; this is a 61 year old woman with type 2 diabetes. She developed a wound on her left Achilles heel just above the tip of the heel in the setting of recurrent ankle surgeries in late 2022. I have seen some of these results from either the cast or the surgical boots that are put on after these operations. She thinks this may be the case. And a  sense of pressure ulcer. In any case that she has been using Santyl Hydrofera Blue under compression. She is not wearing any footwear at home as she cannot find anything to accommodate the wrap 6/22; patient presents for follow-up. She has home health that comes out once a week to help with dressing changes. She has no issues  or complaints today. We have been using Hydrofera Blue under compression therapy. Electronic Signature(s) Signed: 03/26/2022 4:58:39 PM By: [name] CorwinHoffman, Derwin Reddy [name] Entered By: [name] CorwinHoffman, Jaleiah [name] on 03/26/2022 16:35:38 -------------------------------------------------------------------------------- Physical Exam Details Patient Name: Date of Service: [name] BushyHUFFMA N, [name]. 03/26/2022 3:30 PM Medical Record Number: [id number] Patient Account Number: [id number] Date of Birth/Sex: Treating RN: 12-Jul-1961 (61 y.o. [name] CluckF) [name] Primary Care Provider: Junious Dresserampbell, [name] Other Clinician: Referring Provider: Treating Provider/Extender: [name] MoroHoffman, Britanee Vanblarcom Campbell, [name] Weeks in [name]: 3 Constitutional respirations regular, non-labored and within target range for patient.Marland Kitchen. Psychiatric pleasant and cooperative. Notes Left foot: T the posterior ankle there is an open wound with granulation tissue and nonviable tissue. No surrounding signs of infection. o Electronic Signature(s) Signed: 03/26/2022 4:58:39 PM By: [name] CorwinHoffman, Lukisha Procida [name] Entered By: [name] CorwinHoffman, Delynn Pursley on 03/26/2022 16:37:29 -------------------------------------------------------------------------------- Physician Orders Details Patient Name: Date of Service: [name] BushyHUFFMA N, [name]. 03/26/2022 3:30 PM Medical Record Number: [id number] Patient Account Number: [id number] Date of Birth/Sex: Treating RN: 12-Jul-1961 (61 y.o. [name] [name]) [name] Primary Care Provider: Junious Dresserampbell, [name] Other Clinician: Referring Provider: Treating Provider/Extender: [name] MoroHoffman, Hansford Hirt Campbell, [name] Weeks in [name]: 3 Verbal / Phone Orders:  No Diagnosis Coding ICD-10 Coding Code Description [id number] Non-pressure chronic ulcer of other part of left foot with fat layer exposed E11.621 Type 2 diabetes mellitus with foot ulcer E66.01 Morbid (severe) obesity due to excess calories Follow-up Appointments ppointment in 2 weeks. - 04/09/2022 245pm Dr. Mikey BussingHoffman Lennox Laity([name], Room 7) Thursday Return A Bathing/ Shower/ Hygiene May shower with protection but [name] not get wound dressing(s) wet. - May use cast protector bag from [facility] or [employer] Edema Control - Lymphedema / SCD / Other Elevate legs to the level of the heart or above for 30 minutes daily and/or when sitting, a frequency of: Avoid standing for long periods of time. Additional Orders / Instructions Follow Nutritious Diet - Monitor/Control Blood Sugar Home Health New wound care orders this week; continue Home Health for wound care. May utilize formulary equivalent dressing for wound [name] orders unless otherwise specified. - Skilled nursing for wound care 1x and wrap changed in office 1x week. every other week when not coming into wound center home health to change twice a week. Other Home Health Orders/Instructions: - [facility] Wound [name] Wound #1 - Calcaneus Wound Laterality: Left Cleanser: Soap and Water (Home Health) 2 x Per Week/30 Days Discharge Instructions: May shower and wash wound with dial antibacterial soap and water prior to dressing change. Cleanser: Wound Cleanser 2 x Per Week/30 Days Discharge Instructions: Cleanse the wound with wound cleanser prior to applying a clean dressing using gauze sponges, not tissue or cotton balls. Peri-Wound Care: Triamcinolone 15 (g) 2 x Per Week/30 Days Discharge Instructions: Use triamcinolone 15 (g) as directed Peri-Wound Care: Sween Lotion (Moisturizing lotion) (Home Health) 2 x Per Week/30 Days Discharge Instructions: Apply moisturizing lotion as directed Prim Dressing: Hydrofera Blue Ready Foam, 2.5 x2.5 in (Home  Health) 2 x Per Week/30 Days ary Discharge Instructions: Apply to wound bed as instructed Prim Dressing: Santyl Ointment 2 x Per Week/30 Days ary Discharge Instructions: **In Office Only** Secondary Dressing: ABD Pad, 8x10 (Home Health) 2 x Per Week/30 Days Discharge Instructions: Apply over primary dressing as directed. Compression Wrap: ThreePress (3 layer compression wrap) (Home Health) 2 x Per Week/30 Days Discharge Instructions: Apply three layer compression as directed. Electronic Signature(s) Signed: 03/26/2022 4:58:39 PM By: [name] CorwinHoffman, Palyn Scrima [name] Entered By: [name] CorwinHoffman, Jeanpierre Thebeau on 03/26/2022 16:37:37 -------------------------------------------------------------------------------- Problem List Details Patient Name: Date of  Service: [name] Bushy C. 03/26/2022 3:30 PM Medical Record Number: [id number] Patient Account Number: [id number] Date of Birth/Sex: Treating RN: 1961/08/28 (61 y.o. [name], [name] Primary Care Provider: Junious [name] Other Clinician: Referring Provider: Treating Provider/Extender: [name] in [name]: 3 Active Problems ICD-10 Encounter Code Description Active Date MDM Diagnosis [phone] Non-pressure chronic ulcer of other part of left foot with fat layer exposed 03/05/2022 No Yes E11.621 Type 2 diabetes mellitus with foot ulcer 03/05/2022 No Yes E66.01 Morbid (severe) obesity due to excess calories 03/05/2022 No Yes Inactive Problems Resolved Problems Electronic Signature(s) Signed: 03/26/2022 4:58:39 PM By: [name] Corwin [name] Entered By: [name] on 03/26/2022 16:32:21 -------------------------------------------------------------------------------- Progress Note Details Patient Name: Date of Service: [name] Bushy C. 03/26/2022 3:30 PM Medical Record Number: [id number] Patient Account Number: [id number] Date of Birth/Sex: Treating RN: 1961-08-20 (61 y.o. [name] Primary Care Provider: Junious [name] Other Clinician: Referring Provider: Treating Provider/Extender: [name] in [name]: 3 Subjective Chief Complaint Information obtained from Patient 03/05/2022; left foot wound History of Present Illness (HPI) Admission 03/05/2022 Ms. Quincie Mcgurk is a 61 year old female with a past medical history of uncontrolled insulin-dependent type 2 diabetes, tobacco user and chronic diastolic heart failure that presents to the clinic for a 47-month history of wound to her left heel. She states she had a left ankle fusion in September 2022. She states that she always had a wound after the surgery and it never healed. She has home health and they have been doing compression wraps along with silver alginate to the wound bed. She currently denies signs of infection. 6/8; this is a 61 year old woman with type 2 diabetes. She developed a wound on her left Achilles heel just above the tip of the heel in the setting of recurrent ankle surgeries in late 2022. I have seen some of these results from either the cast or the surgical boots that are put on after these operations. She thinks this may be the case. And a sense of pressure ulcer. In any case that she has been using Santyl Hydrofera Blue under compression. She is not wearing any footwear at home as she cannot find anything to accommodate the wrap 6/22; patient presents for follow-up. She has home health that comes out once a week to help with dressing changes. She has no issues or complaints today. We have been using Hydrofera Blue under compression therapy. Patient History Information obtained from Patient, Chart. Family History Cancer - Father,Mother, Diabetes - Mother,Maternal Grandparents,Paternal Grandparents, Heart Disease - Siblings,Maternal Grandparents, Hypertension - Siblings, Tuberculosis - Maternal Grandparents,Paternal Grandparents. Social History Current every day smoker, Marital Status - Single,  Alcohol Use - Never, Drug Use - No History, Caffeine Use - Daily. 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 Vitals Time Taken: 3:18 PM, Height: 66 in, Weight: 339 lbs, BMI: 54.7, Temperature: 98.5 F, Pulse: 80 bpm, Respiratory Rate: 19 breaths/min, Blood Pressure: 175/75 mmHg, Capillary Blood Glucose: 262 mg/dl. Psychiatric pleasant and cooperative. General Notes: Left foot: T  the posterior ankle there is an open wound with granulation tissue and nonviable tissue. No surrounding signs of infection. o Integumentary (Hair, Skin) Wound #1 status is Open. Original cause of wound was Other Lesion. The date acquired was: 10/06/2021. The wound has been in [name] 3 weeks. The wound is located on the Left Calcaneus. The wound measures 1.8cm length x 0.7cm width x 0.1cm depth; 0.99cm^2 area and 0.099cm^3 volume. There is Fat Layer (Subcutaneous Tissue) exposed. There is no tunneling or undermining noted. There is a medium amount of serosanguineous drainage noted. The wound margin is distinct with the outline attached to the wound base. There is medium (34-66%) pink granulation within the wound bed. There is a medium (34-66%) amount of necrotic tissue within the wound bed including Adherent Slough. Assessment Active Problems ICD-10 Non-pressure chronic ulcer of other part  of left foot with fat layer exposed Type 2 diabetes mellitus with foot ulcer Morbid (severe) obesity due to excess calories Patient's wound has shown improvement in size appearance since last clinic visit. I debrided nonviable tissue. I recommended continuing course with Santyl and Hydrofera Blue under 3 layer compression. She has home health that comes out once weekly. Follow-up in 2 weeks. Procedures Wound #1 Pre-procedure diagnosis of Wound #1 is a Diabetic Wound/Ulcer of the Lower Extremity located on the Left Calcaneus .Severity of Tissue Pre Debridement is: Fat layer exposed. There was a Excisional Skin/Subcutaneous Tissue Debridement with a total area of 1.26 sq cm performed by [name] Corwin, [name]. With the following instrument(s): Curette to remove Viable tissue/material. Material removed includes Subcutaneous Tissue, Slough, and Hyper-granulation after achieving pain control using Lidocaine 5% topical ointment. A time out was conducted at 15:40, prior to the start of the procedure. A Minimum amount of bleeding was controlled with Silver Nitrate. The procedure was tolerated well with a pain level of 0 throughout and a pain level of 0 following the procedure. Post Debridement Measurements: 1.8cm length x 0.7cm width x 0.1cm depth; 0.099cm^3 volume. Character of Wound/Ulcer Post Debridement is improved. Severity of Tissue Post Debridement is: Fat layer exposed. Post procedure Diagnosis Wound #1: Same as Pre-Procedure Plan Follow-up Appointments: Return Appointment in 2 weeks. - 04/09/2022 245pm Dr. Mikey Bussing [facility], Room 7) Thursday Bathing/ Shower/ Hygiene: May shower with protection but [name] not get wound dressing(s) wet. - May use cast protector bag from [facility] or [employer] Edema Control - Lymphedema / SCD / Other: Elevate legs to the level of the heart or above for 30 minutes daily and/or when sitting, a frequency of: Avoid standing for long periods of time. Additional Orders /  Instructions: Follow Nutritious Diet - Monitor/Control Blood Sugar Home Health: New wound care orders this week; continue Home Health for wound care. May utilize formulary equivalent dressing for wound [name] orders unless otherwise specified. - Skilled nursing for wound care 1x and wrap changed in office 1x week. every other week when not coming into wound center home health to change twice a week. Other Home Health Orders/Instructions: - [facility] WOUND #1: - Calcaneus Wound Laterality: Left Cleanser: Soap and Water (Home Health) 2 x Per Week/30 Days Discharge Instructions: May shower and wash wound with dial antibacterial soap and water prior to dressing change. Cleanser: Wound Cleanser 2 x Per Week/30 Days Discharge Instructions: Cleanse the wound with wound cleanser prior to applying a clean dressing using gauze sponges, not tissue or cotton balls. Peri-Wound Care: Triamcinolone 15 (g) 2 x Per Week/30 Days Discharge Instructions: Use triamcinolone 15 (g) as  directed Peri-Wound Care: Sween Lotion (Moisturizing lotion) (Home Health) 2 x Per Week/30 Days Discharge Instructions: Apply moisturizing lotion as directed Prim Dressing: Hydrofera Blue Ready Foam, 2.5 x2.5 in (Home Health) 2 x Per Week/30 Days ary Discharge Instructions: Apply to wound bed as instructed Prim Dressing: Santyl Ointment 2 x Per Week/30 Days ary Discharge Instructions: **In Office Only** Secondary Dressing: ABD Pad, 8x10 (Home Health) 2 x Per Week/30 Days Discharge Instructions: Apply over primary dressing as directed. Com pression Wrap: ThreePress (3 layer compression wrap) (Home Health) 2 x Per Week/30 Days Discharge Instructions: Apply three layer compression as directed. 1. In office sharp debridement 2. Hydrofera Blue and sent under 3 layer compression 3. Follow-up in 2 weeks Electronic Signature(s) Signed: 03/26/2022 4:58:39 PM By: [name] Corwin [name] Entered By: [name] on 03/26/2022  16:38:53 -------------------------------------------------------------------------------- HxROS Details Patient Name: Date of Service: [name] Bushy C. 03/26/2022 3:30 PM Medical Record Number: [id number] Patient Account Number: [id number] Date of Birth/Sex: Treating RN: 06/12/1961 (61 y.o. [name] Primary Care Provider: Junious [name] Other Clinician: Referring Provider: Treating Provider/Extender: [name] in [name]: 3 Information Obtained From Patient Chart Hematologic/Lymphatic Medical History: Positive for: Anemia Respiratory Medical History: Positive for: Chronic Obstructive Pulmonary Disease (COPD) Cardiovascular Medical History: Positive for: Congestive Heart Failure - Weighs self daily; Hypertension; Myocardial Infarction; Peripheral Venous Disease Gastrointestinal Medical History: Past Medical History Notes: GERD, Peptic Ulcer Disease Endocrine Medical History: Positive for: Type II Diabetes Time with diabetes: 16 years Treated with: Insulin, Oral agents Blood sugar tested every day: Yes Tested : Twice daily Musculoskeletal Medical History: Past Medical History Notes: Broke left leg 3 years ago, 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, foot fused Neurologic Medical History: Positive for: Neuropathy Psychiatric Medical History: Past Medical History Notes: Depression Immunizations Pneumococcal Vaccine: Received Pneumococcal Vaccination: No Implantable Devices None Family and Social History Cancer: Yes - Father,Mother; Diabetes: Yes - Mother,Maternal Grandparents,Paternal Grandparents; Heart Disease: Yes - Siblings,Maternal Grandparents; Hypertension: Yes - Siblings; Tuberculosis: Yes - Maternal Grandparents,Paternal Grandparents; Current every day smoker; Marital Status - Single; Alcohol Use: Never; Drug Use: No History; Caffeine Use: Daily; Financial Concerns: No; Food, Clothing or Shelter Needs: No; Support System Lacking:  No; Transportation Concerns: No Electronic Signature(s) Signed: 03/26/2022 4:43:05 PM By: Antonieta Iba Signed: 03/26/2022 4:58:39 PM By: [name] Corwin [name] Entered By: [name] on 03/26/2022 16:35:44 -------------------------------------------------------------------------------- SuperBill Details Patient Name: Date of Service: [name] Bushy C. 03/26/2022 Medical Record Number: [id number] Patient Account Number: [id number] Date of Birth/Sex: Treating RN: 1961/01/12 (61 y.o. [name], [name] Primary Care Provider: Junious [name] Other Clinician: Referring Provider: Treating Provider/Extender: [name] in [name]: 3 Diagnosis Coding ICD-10 Codes Code Description [phone] Non-pressure chronic ulcer of other part of left foot with fat layer exposed E11.621 Type 2 diabetes mellitus with foot ulcer E66.01 Morbid (severe) obesity due to excess calories Facility Procedures CPT4 Code: [id number] Description: 11042 - DEB SUBQ TISSUE 20 SQ CM/< ICD-10 Diagnosis Description L97.522 Non-pressure chronic ulcer of other part of left foot with fat layer exposed Modifier: Quantity: 1 Physician Procedures : CPT4 Code Description Modifier [id number] 11042 - WC PHYS SUBQ TISS 20 SQ CM ICD-10 Diagnosis Description L97.522 Non-pressure chronic ulcer of other part of left foot with fat layer exposed Quantity: 1 Electronic Signature(s) Signed: 03/26/2022 4:58:39 PM By: [name] Corwin [name] Entered By: [name] on 03/26/2022 16:39:12

## 2022-04-09 ENCOUNTER — Encounter (HOSPITAL_BASED_OUTPATIENT_CLINIC_OR_DEPARTMENT_OTHER): Payer: 59 | Attending: Internal Medicine | Admitting: Internal Medicine

## 2022-04-09 DIAGNOSIS — I110 Hypertensive heart disease with heart failure: Secondary | ICD-10-CM | POA: Diagnosis not present

## 2022-04-09 DIAGNOSIS — I5032 Chronic diastolic (congestive) heart failure: Secondary | ICD-10-CM | POA: Diagnosis not present

## 2022-04-09 DIAGNOSIS — Z6841 Body Mass Index (BMI) 40.0 and over, adult: Secondary | ICD-10-CM | POA: Insufficient documentation

## 2022-04-09 DIAGNOSIS — Z981 Arthrodesis status: Secondary | ICD-10-CM | POA: Diagnosis not present

## 2022-04-09 DIAGNOSIS — E11621 Type 2 diabetes mellitus with foot ulcer: Secondary | ICD-10-CM

## 2022-04-09 DIAGNOSIS — L97522 Non-pressure chronic ulcer of other part of left foot with fat layer exposed: Secondary | ICD-10-CM | POA: Diagnosis not present

## 2022-04-09 NOTE — Progress Notes (Signed)
[name], [name]. ([id number]) Visit Report for 04/09/2022 Chief Complaint Document Details Patient Name: Date of Service: [name] 04/09/2022 2:30 PM Medical Record Number: [id number] Patient Account Number: [id number] Date of Birth/Sex: Treating RN: 19-Jun-1961 (61 y.o. [name] Primary Care Provider: Jenean Lindau Other Clinician: Referring Provider: Treating Provider/Extender: Elise Benne in Treatment: 5 Information Obtained from: Patient Chief Complaint 03/05/2022; left foot wound Electronic Signature(s) Signed: 04/09/2022 3:39:27 PM By: Kalman Shan DO Entered By: Kalman Shan on 04/09/2022 15:13:11 -------------------------------------------------------------------------------- Debridement Details Patient Name: Date of Service: [name] Lax C. 04/09/2022 2:30 PM Medical Record Number: [id number] Patient Account Number: [id number] Date of Birth/Sex: Treating RN: 07/18/1961 (61 y.o. [name], [name] Primary Care Provider: Jenean Lindau Other Clinician: Referring Provider: Treating Provider/Extender: Elise Benne in Treatment: 5 Debridement Performed for Assessment: Wound #1 Left Calcaneus Performed By: Physician Kalman Shan, DO Debridement Type: Debridement Severity of Tissue Pre Debridement: Fat layer exposed Level of Consciousness (Pre-procedure): Awake and Alert Pre-procedure Verification/Time Out Yes - 15:04 Taken: Start Time: 15:04 Pain Control: Lidocaine T Area Debrided (L x W): otal 2 (cm) x 1.7 (cm) = 3.4 (cm) Tissue and other material debrided: Viable, Non-Viable, Slough, Subcutaneous, Slough Level: Skin/Subcutaneous Tissue Debridement Description: Excisional Instrument: Curette Bleeding: Minimum Hemostasis Achieved: Pressure End Time: 15:04 Procedural Pain: 0 Post Procedural Pain: 0 Response to Treatment: Procedure was tolerated well Level of Consciousness (Post-  Awake and Alert procedure): Post Debridement Measurements of Total Wound Length: (cm) 2 Width: (cm) 1.7 Depth: (cm) 0.2 Volume: (cm) 0.534 Character of Wound/Ulcer Post Debridement: Improved Severity of Tissue Post Debridement: Fat layer exposed Post Procedure Diagnosis Same as Pre-procedure Electronic Signature(s) Signed: 04/09/2022 3:39:27 PM By: Kalman Shan DO Signed: 04/09/2022 4:05:15 PM By: Rhae Hammock RN Entered By: Rhae Hammock on 04/09/2022 15:04:10 -------------------------------------------------------------------------------- HPI Details Patient Name: Date of Service: [name] Lax C. 04/09/2022 2:30 PM Medical Record Number: [id number] Patient Account Number: [id number] Date of Birth/Sex: Treating RN: 04-05-1961 (61 y.o. [name] Primary Care Provider: Jenean Lindau Other Clinician: Referring Provider: Treating Provider/Extender: Elise Benne in Treatment: 5 History of Present Illness HPI Description: Admission 03/05/2022 [name] is a 61 year old female with a past medical history of uncontrolled insulin-dependent type 2 diabetes, tobacco user and chronic diastolic heart failure that presents to the clinic for a 52-month history of wound to her left heel. She states she had a left ankle fusion in September 2022. She states that she always had a wound after the surgery and it never healed. She has home health and they have been doing compression wraps along with silver alginate to the wound bed. She currently denies signs of infection. 6/8; this is a 61 year old woman with type 2 diabetes. She developed a wound on her left Achilles heel just above the tip of the heel in the setting of recurrent ankle surgeries in late 2022. I have seen some of these results from either the cast or the surgical boots that are put on after these operations. She thinks this may be the case. And a sense of pressure ulcer. In any case  that she has been using Santyl Hydrofera Blue under compression. She is not wearing any footwear at home as she cannot find anything to accommodate the wrap 6/22; patient presents for follow-up. She has home health that comes out once a week to help with dressing changes. She has no issues or complaints today. We have  been using Hydrofera Blue under compression therapy. 7/6; patient presents for follow-up. She states that home health did not come out for the past 2 weeks. It is unclear why. We have been using Hydrofera Blue and Santyl under compression therapy. Electronic Signature(s) Signed: 04/09/2022 3:39:27 PM By: Kalman Shan DO Entered By: Kalman Shan on 04/09/2022 15:13:44 -------------------------------------------------------------------------------- Physical Exam Details Patient Name: Date of Service: [name] Lax C. 04/09/2022 2:30 PM Medical Record Number: [id number] Patient Account Number: [id number] Date of Birth/Sex: Treating RN: 1960-11-12 (61 y.o. [name] Primary Care Provider: Jenean Lindau Other Clinician: Referring Provider: Treating Provider/Extender: Elise Benne in Treatment: 5 Constitutional respirations regular, non-labored and within target range for patient.. Cardiovascular 2+ dorsalis pedis/posterior tibialis pulses. Psychiatric pleasant and cooperative. Notes Left foot: T the posterior ankle there is an open wound with granulation tissue and nonviable tissue. This has a darkened hue to it. Slightly more tender on o exam today. Electronic Signature(s) Signed: 04/09/2022 3:39:27 PM By: Kalman Shan DO Entered By: Kalman Shan on 04/09/2022 15:14:26 -------------------------------------------------------------------------------- Physician Orders Details Patient Name: Date of Service: [name] Lax C. 04/09/2022 2:30 PM Medical Record Number: [id number] Patient Account Number: [id number] Date of  Birth/Sex: Treating RN: [birth year] (61 y.o. [name], [name] Primary Care Provider: Jenean Lindau Other Clinician: Referring Provider: Treating Provider/Extender: Elise Benne in Treatment: 5 Verbal / Phone Orders: No Diagnosis Coding Follow-up Appointments ppointment in 1 week. - Thursday 04/16/22 @ 101[address] [place] [place] and [facility] Room # 7 Return A Bathing/ Shower/ Hygiene May shower with protection but do not get wound dressing(s) wet. - May use cast protector bag from [facility], [facility] or [employer] Edema Control - Lymphedema / SCD / Other Elevate legs to the level of the heart or above for 30 minutes daily and/or when sitting, a frequency of: Avoid standing for long periods of time. Additional Orders / Instructions Follow Nutritious Diet - Monitor/Control Blood Sugar Home Health New wound care orders this week; continue Home Health for wound care. May utilize formulary equivalent dressing for wound treatment orders unless otherwise specified. - Skilled nursing for wound care 1x and wrap changed in office 1x week. every other week when not coming into wound center home health to change twice a week. Other Home Health Orders/Instructions: - [facility] Wound Treatment Wound #1 - Calcaneus Wound Laterality: Left Cleanser: Soap and Water (Home Health) 2 x Per Week/30 Days Discharge Instructions: May shower and wash wound with dial antibacterial soap and water prior to dressing change. Cleanser: Wound Cleanser 2 x Per Week/30 Days Discharge Instructions: Cleanse the wound with wound cleanser prior to applying a clean dressing using gauze sponges, not tissue or cotton balls. Peri-Wound Care: Triamcinolone 15 (g) 2 x Per Week/30 Days Discharge Instructions: Use triamcinolone 15 (g) as directed Peri-Wound Care: Sween Lotion (Moisturizing lotion) (Home Health) 2 x Per Week/30 Days Discharge Instructions: Apply moisturizing lotion as directed Topical: Gentamicin 2 x  Per Week/30 Days Discharge Instructions: As directed by physician Topical: Mupirocin Ointment 2 x Per Week/30 Days Discharge Instructions: Apply Mupirocin (Bactroban) as instructed Prim Dressing: Hydrofera Blue Ready Foam, 2.5 x2.5 in (Home Health) 2 x Per Week/30 Days ary Discharge Instructions: Apply to wound bed as instructed Secondary Dressing: ABD Pad, 8x10 (Home Health) 2 x Per Week/30 Days Discharge Instructions: Apply over primary dressing as directed. Compression Wrap: ThreePress (3 layer compression wrap) (Home Health) 2 x Per Week/30 Days Discharge Instructions: Apply three layer compression as directed.  Patient Medications llergies: ceftriaxone A Notifications Medication Indication Start End 04/09/2022 doxycycline hyclate DOSE 1 - oral 100 mg tablet - 1 tablet oral BID x 7 days Electronic Signature(s) Signed: 04/09/2022 3:18:14 PM By: Kalman Shan DO Entered By: Kalman Shan on 04/09/2022 15:18:13 -------------------------------------------------------------------------------- Problem List Details Patient Name: Date of Service: [name] Lax C. 04/09/2022 2:30 PM Medical Record Number: [id number] Patient Account Number: [id number] Date of Birth/Sex: Treating RN: 04/27/1961 (61 y.o. [name] Primary Care Provider: Jenean Lindau Other Clinician: Referring Provider: Treating Provider/Extender: Elise Benne in Treatment: 5 Active Problems ICD-10 Encounter Code Description Active Date MDM Diagnosis [phone] Non-pressure chronic ulcer of other part of left foot with fat layer exposed 03/05/2022 No Yes E11.621 Type 2 diabetes mellitus with foot ulcer 03/05/2022 No Yes E66.01 Morbid (severe) obesity due to excess calories 03/05/2022 No Yes Inactive Problems Resolved Problems Electronic Signature(s) Signed: 04/09/2022 3:39:27 PM By: Kalman Shan DO Entered By: Kalman Shan on 04/09/2022  15:12:40 -------------------------------------------------------------------------------- Progress Note Details Patient Name: Date of Service: [name] Lax C. 04/09/2022 2:30 PM Medical Record Number: [id number] Patient Account Number: [id number] Date of Birth/Sex: Treating RN: Jul 02, 1961 (60 y.o. [name] Primary Care Provider: Jenean Lindau Other Clinician: Referring Provider: Treating Provider/Extender: Elise Benne in Treatment: 5 Subjective Chief Complaint Information obtained from Patient 03/05/2022; left foot wound History of Present Illness (HPI) Admission 03/05/2022 Ms. Tacie Semans is a 61 year old female with a past medical history of uncontrolled insulin-dependent type 2 diabetes, tobacco user and chronic diastolic heart failure that presents to the clinic for a 86-month history of wound to her left heel. She states she had a left ankle fusion in September 2022. She states that she always had a wound after the surgery and it never healed. She has home health and they have been doing compression wraps along with silver alginate to the wound bed. She currently denies signs of infection. 6/8; this is a 61 year old woman with type 2 diabetes. She developed a wound on her left Achilles heel just above the tip of the heel in the setting of recurrent ankle surgeries in late 2022. I have seen some of these results from either the cast or the surgical boots that are put on after these operations. She thinks this may be the case. And a sense of pressure ulcer. In any case that she has been using Santyl Hydrofera Blue under compression. She is not wearing any footwear at home as she cannot find anything to accommodate the wrap 6/22; patient presents for follow-up. She has home health that comes out once a week to help with dressing changes. She has no issues or complaints today. We have been using Hydrofera Blue under compression therapy. 7/6;  patient presents for follow-up. She states that home health did not come out for the past 2 weeks. It is unclear why. We have been using Hydrofera Blue and Santyl under compression therapy. Patient History Information obtained from Patient, Chart. Family History Cancer - Father,Mother, Diabetes - Mother,Maternal Grandparents,Paternal Grandparents, Heart Disease - Siblings,Maternal Grandparents, Hypertension - Siblings, Tuberculosis - Maternal Grandparents,Paternal Grandparents. Social History Current every day smoker, Marital Status - Single, Alcohol Use - Never, Drug Use - No History, Caffeine Use - Daily. 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 Vitals Time Taken: 2:40 PM, Height: 66 in, Weight: 339 lbs, BMI: 54.7, Temperature: 98.6 F, Pulse: 74 bpm, Respiratory Rate: 17 breaths/min, Blood Pressure: 145/74 mmHg, Capillary Blood Glucose: 207 mg/dl. Cardiovascular 2+ dorsalis pedis/posterior tibialis pulses. Psychiatric pleasant and cooperative. General Notes: Left foot: T the posterior ankle there is an open wound with granulation tissue and nonviable tissue. This has a darkened hue to it. Slightly o more tender on exam today. Integumentary (Hair, Skin) Wound #1 status is Open. Original cause of wound was Other Lesion. The date acquired was: 10/06/2021. The  wound has been in treatment 5 weeks. The wound is located on the Left Calcaneus. The wound measures 2cm length x 1.7cm width x 0.2cm depth; 2.67cm^2 area and 0.534cm^3 volume. There is Fat Layer (Subcutaneous Tissue) exposed. There is no tunneling or undermining noted. There is a medium amount of serosanguineous drainage noted. The wound margin is distinct with the outline attached to the wound base. There is medium (34-66%) pink granulation within the wound bed. There is a medium (34-66%) amount of necrotic tissue within the wound bed including Adherent Slough. Assessment Active Problems ICD-10 Non-pressure chronic ulcer of other part of left foot with fat layer exposed Type 2 diabetes mellitus with foot ulcer Morbid (severe) obesity due to excess calories Patient's wound is slightly larger today. There is a darkened hue to the wound bed. I debrided nonviable tissue. She had the wrap on for the past 2 weeks without having it changed. She knows she cannot have this on for more than a week. Unfortunately home health did not come out as they usually do. I recommended mupirocin and gentamicin ointment along with Hydrofera Blue under compression therapy. I will also give her doxycycline as the wound bed appears infected. Procedures Wound #1 Pre-procedure diagnosis of Wound #1 is a Diabetic Wound/Ulcer of the Lower Extremity located on the Left Calcaneus .Severity of Tissue Pre Debridement is: Fat layer exposed. There was a Excisional Skin/Subcutaneous Tissue Debridement with a total area of 3.4 sq cm performed by Geralyn Corwin, DO. With the following instrument(s): Curette to remove Viable and Non-Viable tissue/material. Material removed includes Subcutaneous Tissue and Slough and after achieving pain control using Lidocaine. No specimens were taken. A time out was conducted at 15:04, prior to the start of the procedure. A Minimum amount of bleeding was controlled with Pressure. The procedure was  tolerated well with a pain level of 0 throughout and a pain level of 0 following the procedure. Post Debridement Measurements: 2cm length x 1.7cm width x 0.2cm depth; 0.534cm^3 volume. Character of Wound/Ulcer Post Debridement is improved. Severity of Tissue Post Debridement is: Fat layer exposed. Post procedure Diagnosis Wound #1: Same as Pre-Procedure Pre-procedure diagnosis of Wound #1 is a Diabetic Wound/Ulcer of the Lower Extremity located on the Left Calcaneus . There was a Three Layer Compression Therapy Procedure by [name], RN. Post procedure Diagnosis Wound #1: Same as Pre-Procedure Plan Follow-up Appointments: Return Appointment in 1 week. - Thursday 04/16/22 @ 1015 Dr[address] Mikey [place] and [facility] Room # 7 Bathing/ Shower/ Hygiene: May shower with protection but do not get wound dressing(s) wet. - May use cast protector bag from [facility], [facility] or [employer] Edema Control - Lymphedema / SCD /  Other: Elevate legs to the level of the heart or above for 30 minutes daily and/or when sitting, a frequency of: Avoid standing for long periods of time. Additional Orders / Instructions: Follow Nutritious Diet - Monitor/Control Blood Sugar Home Health: New wound care orders this week; continue Home Health for wound care. May utilize formulary equivalent dressing for wound treatment orders unless otherwise specified. - Skilled nursing for wound care 1x and wrap changed in office 1x week. every other week when not coming into wound center home health to change twice a week. Other Home Health Orders/Instructions: - [facility] WOUND #1: - Calcaneus Wound Laterality: Left Cleanser: Soap and Water (Home Health) 2 x Per Week/30 Days Discharge Instructions: May shower and wash wound with dial antibacterial soap and water prior to dressing change. Cleanser: Wound Cleanser 2 x Per Week/30 Days Discharge Instructions: Cleanse the wound with wound cleanser prior to applying a clean dressing using gauze  sponges, not tissue or cotton balls. Peri-Wound Care: Triamcinolone 15 (g) 2 x Per Week/30 Days Discharge Instructions: Use triamcinolone 15 (g) as directed Peri-Wound Care: Sween Lotion (Moisturizing lotion) (Home Health) 2 x Per Week/30 Days Discharge Instructions: Apply moisturizing lotion as directed Topical: Gentamicin 2 x Per Week/30 Days Discharge Instructions: As directed by physician Topical: Mupirocin Ointment 2 x Per Week/30 Days Discharge Instructions: Apply Mupirocin (Bactroban) as instructed Prim Dressing: Hydrofera Blue Ready Foam, 2.5 x2.5 in (Home Health) 2 x Per Week/30 Days ary Discharge Instructions: Apply to wound bed as instructed Secondary Dressing: ABD Pad, 8x10 (Home Health) 2 x Per Week/30 Days Discharge Instructions: Apply over primary dressing as directed. Com pression Wrap: ThreePress (3 layer compression wrap) (Home Health) 2 x Per Week/30 Days Discharge Instructions: Apply three layer compression as directed. 1. In office sharp debridement 2. Doxycycline 3. Gentamicin/mupirocin ointment with Hydrofera Blue under 3 layer compression Electronic Signature(s) Signed: 04/09/2022 3:39:27 PM By: Geralyn Corwin DO Entered By: Geralyn Corwin on 04/09/2022 15:16:49 -------------------------------------------------------------------------------- HxROS Details Patient Name: Date of Service: [name] 04/09/2022 2:30 PM Medical Record Number: [id number] Patient Account Number: [id number] Date of Birth/Sex: Treating RN: January 25, 1961 (61 y.o. [name] Primary Care Provider: [name] Other Clinician: Referring Provider: Treating Provider/Extender: [name] Information Obtained From Patient Chart Hematologic/Lymphatic Medical History: Positive for: Anemia Respiratory Medical History: Positive for: Chronic Obstructive Pulmonary Disease (COPD) Cardiovascular Medical History: Positive for:  Congestive Heart Failure - Weighs self daily; Hypertension; Myocardial Infarction; Peripheral Venous Disease Gastrointestinal Medical History: Past Medical History Notes: GERD, Peptic Ulcer Disease Endocrine Medical History: Positive for: Type II Diabetes Time with diabetes: 16 years Treated with: Insulin, Oral agents Blood sugar tested every day: Yes Tested : Twice daily Musculoskeletal Medical History: Past Medical History Notes: Broke left leg 3 years ago, 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, foot fused Neurologic Medical History: Positive for: Neuropathy Psychiatric Medical History: Past Medical History Notes: Depression Immunizations Pneumococcal Vaccine: Received Pneumococcal Vaccination: No Implantable Devices None Family and Social History Cancer: Yes - Father,Mother; Diabetes: Yes - Mother,Maternal Grandparents,Paternal Grandparents; Heart Disease: Yes - Siblings,Maternal Grandparents; Hypertension: Yes - Siblings; Tuberculosis: Yes - Maternal Grandparents,Paternal Grandparents; Current every day smoker; Marital Status - Single; Alcohol Use: Never; Drug Use: No History; Caffeine Use: Daily; Financial Concerns: No; Food, Clothing or Shelter Needs: No; Support System Lacking: No; Transportation Concerns: No Electronic Signature(s) Signed: 04/09/2022 3:39:27 PM By: Geralyn Corwin DO Signed: 04/09/2022 4:14:47 PM By: Antonieta Iba Entered By: Geralyn Corwin on 04/09/2022 15:13:51 --------------------------------------------------------------------------------  SuperBill Details Patient Name: Date of Service: [name] 04/09/2022 Medical Record Number: [id number] Patient Account Number: [id number] Date of Birth/Sex: Treating RN: [birth year] (61 y.o. [name], [name] Primary Care Provider: Jenean Lindau Other Clinician: Referring Provider: Treating Provider/Extender: Elise Benne in Treatment: 5 Diagnosis Coding ICD-10 Codes Code  Description [id number] Non-pressure chronic ulcer of other part of left foot with fat layer exposed E11.621 Type 2 diabetes mellitus with foot ulcer E66.01 Morbid (severe) obesity due to excess calories Facility Procedures CPT4 Code: [id number] Description: [id number] - DEB SUBQ TISSUE 20 SQ CM/< ICD-10 Diagnosis Description L97.522 Non-pressure chronic ulcer of other part of left foot with fat layer exposed Modifier: Quantity: 1 Physician Procedures : CPT4 Code Description Modifier [id number] - WC PHYS LEVEL 4 - EST PT ICD-10 Diagnosis Description L97.522 Non-pressure chronic ulcer of other part of left foot with fat layer exposed E11.621 Type 2 diabetes mellitus with foot ulcer E66.01 Morbid  (severe) obesity due to excess calories Quantity: 1 : [id number] - WC PHYS SUBQ TISS 20 SQ CM ICD-10 Diagnosis Description L97.522 Non-pressure chronic ulcer of other part of left foot with fat layer exposed Quantity: 1 Electronic Signature(s) Signed: 04/09/2022 3:39:27 PM By: Kalman Shan DO Entered By: Kalman Shan on 04/09/2022 15:18:35

## 2022-04-09 NOTE — Progress Notes (Signed)
[name], [name]. ([id number]) Visit Report for 04/09/2022 Arrival Information Details Patient Name: Date of Service: [name] 04/09/2022 2:30 PM Medical Record Number: [id number] Patient Account Number: [id number] Date of Birth/Sex: Treating RN: 05/28/1961 (61 y.o. [name], [name] Primary Care [name]: [name] Other Clinician: Referring Hewitt Garner: Treating [name]/Extender: [name] Visit Information History Since Last Visit Added or deleted any medications: No Patient Arrived: Wheel Chair Any new allergies or adverse reactions: No Arrival Time: 14:38 Had a fall or experienced change in No Accompanied By: son n law activities of daily living that may affect Transfer Assistance: Manual risk of falls: Patient Identification Verified: Yes Signs or symptoms of abuse/neglect since last visito No Secondary Verification Process Completed: Yes Hospitalized since last visit: No Patient Requires Transmission-Based Precautions: No Implantable device outside of the clinic excluding No Patient Has Alerts: No cellular tissue based products placed in the center since last visit: Has Dressing in Place as Prescribed: Yes Has Compression in Place as Prescribed: Yes Pain Present Now: Yes Electronic Signature(s) Signed: 04/09/2022 4:05:15 PM By: [name] RN Entered By: [name] on 04/09/2022 14:38:57 -------------------------------------------------------------------------------- Compression Therapy Details Patient Name: Date of Service: [name] Bushy C. 04/09/2022 2:30 PM Medical Record Number: [id number] Patient Account Number: [id number] Date of Birth/Sex: Treating RN: 12-Mar-1961 (61 y.o. [name], [name] Primary Care Tacha Manni: [name] Other Clinician: Referring Raiyah Speakman: Treating [name]/Extender: [name] Compression Therapy Performed for  Wound Assessment: Wound #1 Left Calcaneus Performed By: Clinician [name], RN Compression Type: Three Layer Post Procedure Diagnosis Same as Pre-procedure Electronic Signature(s) Signed: 04/09/2022 4:05:15 PM By: [name] RN Entered By: [name] on 04/09/2022 15:05:14 -------------------------------------------------------------------------------- Encounter Discharge Information Details Patient Name: Date of Service: [name] Bushy C. 04/09/2022 2:30 PM Medical Record Number: [id number] Patient Account Number: [id number] Date of Birth/Sex: Treating RN: 17-Aug-1961 (61 y.o. [name], [name] Primary Care Jalesa Thien: [name] Other Clinician: Referring Lamarcus Spira: Treating Jaymie Misch/Extender: [name] Encounter Discharge Information Items Post Procedure Vitals Discharge Condition: Stable Temperature (F): 98.7 Ambulatory Status: Wheelchair Pulse (bpm): 74 Discharge Destination: Home Respiratory Rate (breaths/min): 17 Transportation: Private Auto Blood Pressure (mmHg): 134/74 Accompanied By: son n   law Schedule Follow-up Appointment: Yes Clinical Summary of Care: Patient Declined Electronic Signature(s) Signed: 04/09/2022 4:05:15 PM By: [name] RN Entered By: [name] on 04/09/2022 15:25:58 -------------------------------------------------------------------------------- Lower Extremity Assessment Details Patient Name: Date of Service: [name] [name] C. 04/09/2022 2:30 PM Medical Record Number: [id number] Patient Account Number: [id number] Date of Birth/Sex: Treating RN: November 07, 1960 (61 y.o. [name], [name] Primary Care [name]: [name] Other Clinician: Referring Donalyn Schneeberger: Treating Denishia Citro/Extender: [name] Edema Assessment Assessed: [name]: Yes] [name]: No] Edema: [Left: Ye] [Right: s] Calf Left:  Right: Point of Measurement: 28 cm From Medial Instep Ankle Left: Right: Point of Measurement: 3 cm From Medial Instep Vascular Assessment Pulses: Dorsalis Pedis Palpable: [Left:Yes] Posterior Tibial Palpable: [Left:Yes] Electronic Signature(s) Signed: 04/09/2022 4:05:15 PM By: [name] RN Entered By: [name] on 04/09/2022 14:40:52 -------------------------------------------------------------------------------- Multi Wound Chart Details Patient Name: Date of Service: [name] Bushy C. 04/09/2022 2:30 PM Medical Record Number: [id number] Patient Account Number: [id number] Date of Birth/Sex: Treating RN: Oct 01, 1961 (61 y.o. [name] Primary Care [name]: [name] Other Clinician: Referring Korvin Valentine: Treating [name]/Extender: [name] Vital Signs Height(in):  66 Capillary Blood Glucose(mg/dl): 207 Weight(lbs): 339 Pulse(bpm): 74 Body Mass Index(BMI): 54.7 Blood Pressure(mmHg): 145/74 Temperature(F): 98.6 Respiratory Rate(breaths/min): 17 Photos: [N/A:N/A] Left Calcaneus N/A N/A Wound Location: Other Lesion N/A N/A Wounding Event: Diabetic Wound/Ulcer of the Lower N/A N/A Primary Etiology: Extremity Anemia, Chronic Obstructive N/A N/A Comorbid History: Pulmonary Disease (COPD), Congestive Heart Failure, Hypertension, Myocardial Infarction, Peripheral Venous Disease, Type II Diabetes, Neuropathy 10/06/2021 N/A N/A Date Acquired: 5 N/A N/A Weeks of Treatment: Open N/A N/A Wound Status: No N/A N/A Wound Recurrence: 2x1.7x0.2 N/A N/A Measurements L x W x D (cm) 2.67 N/A N/A A (cm) : rea 0.534 N/A N/A Volume (cm) : 39.30% N/A N/A % Reduction in A rea: 39.30% N/A N/A % Reduction in Volume: Grade 2 N/A N/A Classification: Medium N/A N/A Exudate A mount: Serosanguineous N/A N/A Exudate Type: red, brown N/A N/A Exudate Color: Distinct, outline attached N/A N/A Wound  Margin: Medium (34-66%) N/A N/A Granulation A mount: Pink N/A N/A Granulation Quality: Medium (34-66%) N/A N/A Necrotic A mount: Fat Layer (Subcutaneous Tissue): Yes N/A N/A Exposed Structures: Fascia: No Tendon: No Muscle: No Joint: No Bone: No None N/A N/A Epithelialization: Debridement - Excisional N/A N/A Debridement: Pre-procedure Verification/Time Out 15:04 N/A N/A Taken: Lidocaine N/A N/A Pain Control: Subcutaneous, Slough N/A N/A Tissue Debrided: Skin/Subcutaneous Tissue N/A N/A Level: 3.4 N/A N/A Debridement A (sq cm): rea Curette N/A N/A Instrument: Minimum N/A N/A Bleeding: Pressure N/A N/A Hemostasis A chieved: 0 N/A N/A Procedural Pain: 0 N/A N/A Post Procedural Pain: Procedure was tolerated well N/A N/A Debridement Treatment Response: Post Debridement Measurements L x 2x1.7x0.2 N/A N/A W x D (cm) 0.534 N/A N/A Post Debridement Volume: (cm) Compression Therapy N/A N/A Procedures Performed: Debridement Treatment Notes Electronic Signature(s) Signed: 04/09/2022 3:39:27 PM By: Kalman Shan DO Signed: 04/09/2022 4:14:47 PM By: Lorrin Jackson Entered By: Kalman Shan on 04/09/2022 15:12:47 -------------------------------------------------------------------------------- Multi-Disciplinary Care Plan Details Patient Name: Date of Service: [name] Lax C. 04/09/2022 2:30 PM Medical Record Number: [id number] Patient Account Number: [id number] Date of Birth/Sex: Treating RN: 01/21/1961 (61 y.o. [name], [name] Primary Care Irena Gaydos: Jenean Lindau Other Clinician: Referring Kalijah Zeiss: Treating Hildegard Hlavac/Extender: Elise Benne in Treatment: 5 Active Inactive Abuse / Safety / Falls / Self Care Management Nursing Diagnoses: History of Falls Goals: Patient will not experience any injury related to falls Date Initiated: 03/05/2022 Target Resolution Date: 05/02/2022 Goal Status: Active Interventions: Assess  fall risk on admission and as needed Assess: immobility, friction, shearing, incontinence upon admission and as needed Assess impairment of mobility on admission and as needed per policy Notes: Wound/Skin Impairment Nursing Diagnoses: Impaired tissue integrity Goals: Patient/caregiver will verbalize understanding of skin care regimen Date Initiated: 03/05/2022 Target Resolution Date: 05/02/2022 Goal Status: Active Ulcer/skin breakdown will have a volume reduction of 30% by week 4 Date Initiated: 03/05/2022 Target Resolution Date: 05/02/2022 Goal Status: Active Interventions: Assess patient/caregiver ability to obtain necessary supplies Assess patient/caregiver ability to perform ulcer/skin care regimen upon admission and as needed Assess ulceration(s) every visit Provide education on smoking Provide education on ulcer and skin care Treatment Activities: Topical wound management initiated : 03/05/2022 Notes: Electronic Signature(s) Signed: 04/09/2022 4:05:15 PM By: Rhae Hammock RN Entered By: Rhae Hammock on 04/09/2022 15:01:41 -------------------------------------------------------------------------------- Pain Assessment Details Patient Name: Date of Service: [name] Lax C. 04/09/2022 2:30 PM Medical Record Number: [id number] Patient Account Number: [id number] Date of Birth/Sex: Treating RN: [birth year] (61 y.o. [name], [name] Primary Care Nafisah Runions: Jenean Lindau Other Clinician: Referring  Carrera Kiesel: Treating Armen Waring/Extender: Elise Benne in Treatment: 5 Active Problems Location of Pain Severity and Description of Pain Patient Has Paino Yes Site Locations Pain Location: Generalized Pain, Pain in Ulcers With Dressing Change: Yes Duration of the Pain. Constant / Intermittento Constant Rate the pain. Current Pain Level: 7 Worst Pain Level: 10 Least Pain Level: 0 Tolerable Pain Level: 7 Character of Pain Describe the Pain:  Aching Pain Management and Medication Current Pain Management: Medication: No Cold Application: No Rest: No Massage: No Activity: No T.E.N.S.: No Heat Application: No Leg drop or elevation: No Is the Current Pain Management Adequate: Adequate How does your wound impact your activities of daily livingo Sleep: No Bathing: No Appetite: No Relationship With Others: No Bladder Continence: No Emotions: No Bowel Continence: No Work: No Toileting: No Drive: No Dressing: No Hobbies: No Electronic Signature(s) Signed: 04/09/2022 4:05:15 PM By: Rhae Hammock RN Entered By: Rhae Hammock on 04/09/2022 14:40:40 -------------------------------------------------------------------------------- Patient/Caregiver Education Details Patient Name: Date of Service: [name] 7/6/2023andnbsp2:30 PM Medical Record Number: [id number] Patient Account Number: [id number] Date of Birth/Gender: Treating RN: 06/11/1961 (62 y.o. [name], [name] Primary Care Physician: Jenean Lindau Other Clinician: Referring Physician: Treating Physician/Extender: Elise Benne in Treatment: 5 Education Assessment Education Provided To: Patient Education Topics Provided Smoking and Wound Healing: Methods: Explain/Verbal Responses: Reinforcements needed, State content correctly Wound/Skin Impairment: Methods: Explain/Verbal Responses: Reinforcements needed, State content correctly Electronic Signature(s) Signed: 04/09/2022 4:05:15 PM By: Rhae Hammock RN Entered By: Rhae Hammock on 04/09/2022 15:01:58 -------------------------------------------------------------------------------- Wound Assessment Details Patient Name: Date of Service: [name] Lax C. 04/09/2022 2:30 PM Medical Record Number: [id number] Patient Account Number: [id number] Date of Birth/Sex: Treating RN: 10/29/1960 (61 y.o. [name], [name] Primary Care Kenda Kloehn: Jenean Lindau Other Clinician: Referring Jovonna Nickell: Treating Deva Ron/Extender: Elise Benne in Treatment: 5 Wound Status Wound Number: 1 Primary Diabetic Wound/Ulcer of the Lower Extremity Etiology: Wound Location: Left Calcaneus Wound Open Wounding Event: Other Lesion Status: Date Acquired: 10/06/2021 Comorbid Anemia, Chronic Obstructive Pulmonary Disease (COPD), Weeks Of Treatment: 5 History: Congestive Heart Failure, Hypertension, Myocardial Infarction, Clustered Wound: No Peripheral Venous Disease, Type II Diabetes, Neuropathy Photos Wound Measurements Length: (cm) 2 Width: (cm) 1.7 Depth: (cm) 0.2 Area: (cm) 2.67 Volume: (cm) 0.534 % Reduction in Area: 39.3% % Reduction in Volume: 39.3% Epithelialization: None Tunneling: No Undermining: No Wound Description Classification: Grade 2 Wound Margin: Distinct, outline attached Exudate Amount: Medium Exudate Type: Serosanguineous Exudate Color: red, brown Foul Odor After Cleansing: No Slough/Fibrino Yes Wound Bed Granulation Amount: Medium (34-66%) Exposed Structure Granulation Quality: Pink Fascia Exposed: No Necrotic Amount: Medium (34-66%) Fat Layer (Subcutaneous Tissue) Exposed: Yes Necrotic Quality: Adherent Slough Tendon Exposed: No Muscle Exposed: No Joint Exposed: No Bone Exposed: No Treatment Notes Wound #1 (Calcaneus) Wound Laterality: Left Cleanser Soap and Water Discharge Instruction: May shower and wash wound with dial antibacterial soap and water prior to dressing change. Wound Cleanser Discharge Instruction: Cleanse the wound with wound cleanser prior to applying a clean dressing using gauze sponges, not tissue or cotton balls. Peri-Wound Care Triamcinolone 15 (g) Discharge Instruction: Use triamcinolone 15 (g) as directed Sween Lotion (Moisturizing lotion) Discharge Instruction: Apply moisturizing lotion as directed Topical Gentamicin Discharge Instruction: As directed  by physician Mupirocin Ointment Discharge Instruction: Apply Mupirocin (Bactroban) as instructed Primary Dressing Hydrofera Blue Ready Foam, 2.5 x2.5 in Discharge Instruction: Apply to wound bed as instructed Secondary Dressing ABD Pad, 8x10 Discharge Instruction: Apply over primary dressing  as directed. Secured With Compression Wrap ThreePress (3 layer compression wrap) Discharge Instruction: Apply three layer compression as directed. Compression Stockings Add-Ons Electronic Signature(s) Signed: 04/09/2022 4:05:15 PM By: [name] RN Entered By: [name] on 04/09/2022 14:54:21 -------------------------------------------------------------------------------- Vitals Details Patient Name: Date of Service: [name] Bushy C. 04/09/2022 2:30 PM Medical Record Number: [id number] Patient Account Number: [id number] Date of Birth/Sex: Treating RN: [birth year] (61 y.o. [name], [name] Primary Care Magdelyn Roebuck: [name] Other Clinician: Referring Concepcion Gillott: Treating Gita Dilger/Extender: [name] Vital Signs Time Taken: 14:40 Temperature (F): 98.6 Height (in): 66 Pulse (bpm): 74 Weight (lbs): 339 Respiratory Rate (breaths/min): 17 Body Mass Index (BMI): 54.7 Blood Pressure (mmHg): 145/74 Capillary Blood Glucose (mg/dl): 381 Reference Range: 80 - 120 mg / dl Electronic Signature(s) Signed: 04/09/2022 4:05:15 PM By: [name] RN Entered By: [name] on 04/09/2022 14:39:43

## 2022-04-16 ENCOUNTER — Encounter (HOSPITAL_BASED_OUTPATIENT_CLINIC_OR_DEPARTMENT_OTHER): Payer: 59 | Admitting: Internal Medicine

## 2022-04-16 DIAGNOSIS — L97522 Non-pressure chronic ulcer of other part of left foot with fat layer exposed: Secondary | ICD-10-CM

## 2022-04-16 DIAGNOSIS — E11621 Type 2 diabetes mellitus with foot ulcer: Secondary | ICD-10-CM | POA: Diagnosis not present

## 2022-04-16 NOTE — Progress Notes (Signed)
Falotico, [place] C. ([id number]) Visit Report for 04/16/2022 Chief Complaint Document Details Patient Name: Date of Service: [name] 04/16/2022 10:15 A M Medical Record Number: [id number] Patient Account Number: [id number] Date of Birth/Sex: Treating RN: 1961/07/27 (61 y.o. [name] Primary Care Provider: Jenean Lindau Other Clinician: Referring Provider: Treating Provider/Extender: Elise Benne in Treatment: 6 Information Obtained from: Patient Chief Complaint 03/05/2022; left foot wound Electronic Signature(s) Signed: 04/16/2022 1:08:16 PM By: Kalman Shan DO Entered By: Kalman Shan on 04/16/2022 11:24:19 -------------------------------------------------------------------------------- Debridement Details Patient Name: Date of Service: [name] Lax C. 04/16/2022 10:15 A M Medical Record Number: [id number] Patient Account Number: [id number] Date of Birth/Sex: Treating RN: 03-01-1961 (61 y.o. [name] Primary Care Provider: Jenean Lindau Other Clinician: Referring Provider: Treating Provider/Extender: Elise Benne in Treatment: 6 Debridement Performed for Assessment: Wound #1 Left Calcaneus Performed By: Physician Kalman Shan, DO Debridement Type: Debridement Severity of Tissue Pre Debridement: Fat layer exposed Level of Consciousness (Pre-procedure): Awake and Alert Pre-procedure Verification/Time Out Yes - 11:10 Taken: Start Time: 11:11 Pain Control: Lidocaine 4% T opical Solution T Area Debrided (L x W): otal 1.8 (cm) x 1.6 (cm) = 2.88 (cm) Tissue and other material debrided: Non-Viable, Slough, Subcutaneous, Slough Level: Skin/Subcutaneous Tissue Debridement Description: Excisional Instrument: Curette Bleeding: Minimum Hemostasis Achieved: Pressure End Time: 11:17 Response to Treatment: Procedure was tolerated well Level of Consciousness (Post- Awake and  Alert procedure): Post Debridement Measurements of Total Wound Length: (cm) 1.8 Width: (cm) 1.6 Depth: (cm) 0.2 Volume: (cm) 0.452 Character of Wound/Ulcer Post Debridement: Stable Severity of Tissue Post Debridement: Fat layer exposed Post Procedure Diagnosis Same as Pre-procedure Electronic Signature(s) Signed: 04/16/2022 1:08:16 PM By: Kalman Shan DO Signed: 04/16/2022 4:36:00 PM By: Lorrin Jackson Entered By: Lorrin Jackson on 04/16/2022 11:17:46 -------------------------------------------------------------------------------- HPI Details Patient Name: Date of Service: [name] Lax C. 04/16/2022 10:15 A M Medical Record Number: [id number] Patient Account Number: [id number] Date of Birth/Sex: Treating RN: January 13, 1961 (61 y.o. [name] Primary Care Provider: Jenean Lindau Other Clinician: Referring Provider: Treating Provider/Extender: Elise Benne in Treatment: 6 History of Present Illness HPI Description: Admission 03/05/2022 [name] is a 61 year old female with a past medical history of uncontrolled insulin-dependent type 2 diabetes, tobacco user and chronic diastolic heart failure that presents to the clinic for a 31-month history of wound to her left heel. She states she had a left ankle fusion in September 2022. She states that she always had a wound after the surgery and it never healed. She has home health and they have been doing compression wraps along with silver alginate to the wound bed. She currently denies signs of infection. 6/8; this is a 61 year old woman with type 2 diabetes. She developed a wound on her left Achilles heel just above the tip of the heel in the setting of recurrent ankle surgeries in late 2022. I have seen some of these results from either the cast or the surgical boots that are put on after these operations. She thinks this may be the case. And a sense of pressure ulcer. In any case that she has  been using Santyl Hydrofera Blue under compression. She is not wearing any footwear at home as she cannot find anything to accommodate the wrap 6/22; patient presents for follow-up. She has home health that comes out once a week to help with dressing changes. She has no issues or complaints today. We have been using  Hydrofera Blue under compression therapy. 7/6; patient presents for follow-up. She states that home health did not come out for the past 2 weeks. It is unclear why. We have been using Hydrofera Blue and Santyl under compression therapy. 7/13; patient presents for follow-up. She did not take the oral antibiotics prescribed at last clinic visit. We have been using Hydrofera Blue with gentamicin/mupirocin ointment under compression therapy. She has no issues or complaints today. She denies signs of infection. Electronic Signature(s) Signed: 04/16/2022 1:08:16 PM By: Kalman Shan DO Entered By: Kalman Shan on 04/16/2022 11:24:50 -------------------------------------------------------------------------------- Physical Exam Details Patient Name: Date of Service: [name] Neighbor MY C. 04/16/2022 10:15 A M Medical Record Number: [id number] Patient Account Number: [id number] Date of Birth/Sex: Treating RN: 1961/07/17 (61 y.o. [name] Primary Care Provider: Jenean Lindau Other Clinician: Referring Provider: Treating Provider/Extender: Elise Benne in Treatment: 6 Constitutional respirations regular, non-labored and within target range for patient.. Cardiovascular 2+ dorsalis pedis/posterior tibialis pulses. Psychiatric pleasant and cooperative. Notes Left foot: T the posterior ankle there is an open wound with pink/healthy granulation tissue and nonviable tissue. Appears well-healing. o Electronic Signature(s) Signed: 04/16/2022 1:08:16 PM By: Kalman Shan DO Entered By: Kalman Shan on 04/16/2022  11:25:43 -------------------------------------------------------------------------------- Physician Orders Details Patient Name: Date of Service: [name] Lax C. 04/16/2022 10:15 A M Medical Record Number: [id number] Patient Account Number: [id number] Date of Birth/Sex: Treating RN: [birth year] (61 y.o. [name] Primary Care Provider: Jenean Lindau Other Clinician: Referring Provider: Treating Provider/Extender: Elise Benne in Treatment: 6 Verbal / Phone Orders: No Diagnosis Coding Follow-up Appointments ppointment in 1 week. - Thursday 04/23/22 @ 2:45p Dr. Heber [place] and Leveda Anna (Room # 7) Return A Bathing/ Shower/ Hygiene May shower with protection but do not get wound dressing(s) wet. - May use cast protector bag from [facility], [facility] or [employer] Edema Control - Lymphedema / SCD / Other Elevate legs to the level of the heart or above for 30 minutes daily and/or when sitting, a frequency of: Avoid standing for long periods of time. Additional Orders / Instructions Follow Nutritious Diet - Monitor/Control Blood Sugar Home Health New wound care orders this week; continue Home Health for wound care. May utilize formulary equivalent dressing for wound treatment orders unless otherwise specified. - Skilled nursing for wound care 1x and wrap changed in office 1x week. every other week when not coming into wound center home health to change twice a week. Other Home Health Orders/Instructions: - [facility] Wound Treatment Wound #1 - Calcaneus Wound Laterality: Left Cleanser: Soap and Water (Home Health) 2 x Per Week/30 Days Discharge Instructions: May shower and wash wound with dial antibacterial soap and water prior to dressing change. Cleanser: Wound Cleanser 2 x Per Week/30 Days Discharge Instructions: Cleanse the wound with wound cleanser prior to applying a clean dressing using gauze sponges, not tissue or cotton balls. Peri-Wound Care:  Triamcinolone 15 (g) 2 x Per Week/30 Days Discharge Instructions: Use triamcinolone 15 (g) as directed Peri-Wound Care: Sween Lotion (Moisturizing lotion) (Home Health) 2 x Per Week/30 Days Discharge Instructions: Apply moisturizing lotion as directed Topical: Gentamicin 2 x Per Week/30 Days Discharge Instructions: In office only Topical: Mupirocin Ointment 2 x Per Week/30 Days Discharge Instructions: In office only Prim Dressing: Hydrofera Blue Ready Foam, 2.5 x2.5 in ([place]) 2 x Per Week/30 Days ary Discharge Instructions: Apply to wound bed as instructed Secondary Dressing: ABD Pad, 8x10 (Home Health) 2 x Per Week/30 Days Discharge Instructions:  Apply over primary dressing as directed. Compression Wrap: ThreePress (3 layer compression wrap) (Home Health) 2 x Per Week/30 Days Discharge Instructions: Apply three layer compression as directed. Electronic Signature(s) Signed: 04/16/2022 1:08:16 PM By: Geralyn Corwin DO Entered By: Geralyn Corwin on 04/16/2022 11:26:29 -------------------------------------------------------------------------------- Problem List Details Patient Name: Date of Service: [name] Bushy C. 04/16/2022 10:15 A M Medical Record Number: [id number] Patient Account Number: [id number] Date of Birth/Sex: Treating RN: 1960/11/12 (61 y.o. [name] Primary Care Provider: [name] Other Clinician: Referring Provider: Treating Provider/Extender: [name] in Treatment: 6 Active Problems ICD-10 Encounter Code Description Active Date MDM Diagnosis [phone] Non-pressure chronic ulcer of other part of left foot with fat layer exposed 03/05/2022 No Yes E11.621 Type 2 diabetes mellitus with foot ulcer 03/05/2022 No Yes E66.01 Morbid (severe) obesity due to excess calories 03/05/2022 No Yes Inactive Problems Resolved Problems Electronic Signature(s) Signed: 04/16/2022 1:08:16 PM By: Geralyn Corwin DO Entered By:  Geralyn Corwin on 04/16/2022 11:24:05 -------------------------------------------------------------------------------- Progress Note Details Patient Name: Date of Service: [name] Bushy C. 04/16/2022 10:15 A M Medical Record Number: [id number] Patient Account Number: [id number] Date of Birth/Sex: Treating RN: 07-06-1961 (61 y.o. [name] Primary Care Provider: [name] Other Clinician: Referring Provider: Treating Provider/Extender: [name] in Treatment: 6 Subjective Chief Complaint Information obtained from Patient 03/05/2022; left foot wound History of Present Illness (HPI) Admission 03/05/2022 [name] is a 61 year old female with a past medical history of uncontrolled insulin-dependent type 2 diabetes, tobacco user and chronic diastolic heart failure that presents to the clinic for a 85-month history of wound to her left heel. She states she had a left ankle fusion in September 2022. She states that she always had a wound after the surgery and it never healed. She has home health and they have been doing compression wraps along with silver alginate to the wound bed. She currently denies signs of infection. 6/8; this is a 61 year old woman with type 2 diabetes. She developed a wound on her left Achilles heel just above the tip of the heel in the setting of recurrent ankle surgeries in late 2022. I have seen some of these results from either the cast or the surgical boots that are put on after these operations. She thinks this may be the case. And a sense of pressure ulcer. In any case that she has been using Santyl Hydrofera Blue under compression. She is not wearing any footwear at home as she cannot find anything to accommodate the wrap 6/22; patient presents for follow-up. She has home health that comes out once a week to help with dressing changes. She has no issues or complaints today. We have been using Hydrofera Blue under  compression therapy. 7/6; patient presents for follow-up. She states that home health did not come out for the past 2 weeks. It is unclear why. We have been using Hydrofera Blue and Santyl under compression therapy. 7/13; patient presents for follow-up. She did not take the oral antibiotics prescribed at last clinic visit. We have been using Hydrofera Blue with gentamicin/mupirocin ointment under compression therapy. She has no issues or complaints today. She denies signs of infection. Patient History Information obtained from Patient, Chart. Family History Cancer - Father,Mother, Diabetes - Mother,Maternal Grandparents,Paternal Grandparents, Heart Disease - Siblings,Maternal Grandparents, Hypertension - Siblings, Tuberculosis - Maternal Grandparents,Paternal Grandparents. Social History Current every day smoker, Marital Status - Single, Alcohol Use - Never, Drug Use - No History, Caffeine  Use - Daily. 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 Vitals Time Taken: 10:44 AM, Height: 66 in, Weight: 339 lbs, BMI: 54.7, Temperature: 98.9 F, Pulse: 71 bpm, Respiratory Rate: 18 breaths/min, Blood Pressure: 134/80 mmHg. Cardiovascular 2+ dorsalis pedis/posterior tibialis pulses. Psychiatric pleasant and cooperative. General Notes: Left foot: T the posterior ankle there  is an open wound with pink/healthy granulation tissue and nonviable tissue. Appears well-healing. o Integumentary (Hair, Skin) Wound #1 status is Open. Original cause of wound was Other Lesion. The date acquired was: 10/06/2021. The wound has been in treatment 6 weeks. The wound is located on the Left Calcaneus. The wound measures 1.8cm length x 1.6cm width x 0.2cm depth; 2.262cm^2 area and 0.452cm^3 volume. There is Fat Layer (Subcutaneous Tissue) exposed. There is no tunneling or undermining noted. There is a medium amount of serosanguineous drainage noted. The wound margin is distinct with the outline attached to the wound base. There is medium (34-66%) pink granulation within the wound bed. There is a medium (34-66%) amount of necrotic tissue within the wound bed including Adherent Slough. Assessment Active Problems ICD-10 Non-pressure chronic ulcer of other part of left foot with fat layer exposed Type 2 diabetes mellitus with foot ulcer Morbid (severe) obesity due to excess calories Patient's wound has shown improvement in size in appearance since last clinic visit. I debrided nonviable tissue. I recommended continuing with antibiotic ointment and Hydrofera Blue under 3 layer compression. She did not take the oral antibiotics This past week. She can hold off for now since there has been improvement in wound healing. Procedures Wound #1 Pre-procedure diagnosis of Wound #1 is a Diabetic Wound/Ulcer of the Lower Extremity located on the Left Calcaneus .Severity of Tissue Pre Debridement is: Fat layer exposed. There was a Excisional Skin/Subcutaneous Tissue Debridement with a total area of 2.88 sq cm performed by Geralyn Corwin, DO. With the following instrument(s): Curette to remove Non-Viable tissue/material. Material removed includes Subcutaneous Tissue and Slough and after achieving pain control using Lidocaine 4% T opical Solution. No specimens were taken. A time out was conducted at  11:10, prior to the start of the procedure. A Minimum amount of bleeding was controlled with Pressure. The procedure was tolerated well. Post Debridement Measurements: 1.8cm length x 1.6cm width x 0.2cm depth; 0.452cm^3 volume. Character of Wound/Ulcer Post Debridement is stable. Severity of Tissue Post Debridement is: Fat layer exposed. Post procedure Diagnosis Wound #1: Same as Pre-Procedure Pre-procedure diagnosis of Wound #1 is a Diabetic Wound/Ulcer of the Lower Extremity located on the Left Calcaneus . There was a Three Layer Compression Therapy Procedure by Antonieta Iba, RN. Post procedure Diagnosis Wound #1: Same as Pre-Procedure Plan Follow-up Appointments: Return Appointment in 1 week. - Thursday 04/23/22 @ 2:45p Dr. Mikey Bussing and Lennox Laity (Room # 7) Bathing/ Shower/ Hygiene: May shower with protection but do not get wound dressing(s) wet. - May use cast protector bag from [facility], [facility] or [employer] Edema Control - Lymphedema / SCD / Other: Elevate legs to the level of the heart or above for 30  minutes daily and/or when sitting, a frequency of: Avoid standing for long periods of time. Additional Orders / Instructions: Follow Nutritious Diet - Monitor/Control Blood Sugar Home Health: New wound care orders this week; continue Home Health for wound care. May utilize formulary equivalent dressing for wound treatment orders unless otherwise specified. - Skilled nursing for wound care 1x and wrap changed in office 1x week. every other week when not coming into wound center home health to change twice a week. Other Home Health Orders/Instructions: - [facility] WOUND #1: - Calcaneus Wound Laterality: Left Cleanser: Soap and Water (Home Health) 2 x Per Week/30 Days Discharge Instructions: May shower and wash wound with dial antibacterial soap and water prior to dressing change. Cleanser: Wound Cleanser 2 x Per Week/30 Days Discharge Instructions: Cleanse the wound with wound cleanser prior to  applying a clean dressing using gauze sponges, not tissue or cotton balls. Peri-Wound Care: Triamcinolone 15 (g) 2 x Per Week/30 Days Discharge Instructions: Use triamcinolone 15 (g) as directed Peri-Wound Care: Sween Lotion (Moisturizing lotion) (Home Health) 2 x Per Week/30 Days Discharge Instructions: Apply moisturizing lotion as directed Topical: Gentamicin 2 x Per Week/30 Days Discharge Instructions: In office only Topical: Mupirocin Ointment 2 x Per Week/30 Days Discharge Instructions: In office only Prim Dressing: Hydrofera Blue Ready Foam, 2.5 x2.5 in ([place]) 2 x Per Week/30 Days ary Discharge Instructions: Apply to wound bed as instructed Secondary Dressing: ABD Pad, 8x10 (Home Health) 2 x Per Week/30 Days Discharge Instructions: Apply over primary dressing as directed. Com pression Wrap: ThreePress (3 layer compression wrap) (Home Health) 2 x Per Week/30 Days Discharge Instructions: Apply three layer compression as directed. 1. In office sharp debridement 2. Hydrofera Blue with mupirocin/gentamicin ointment under 3 layer compression 3. Follow-up in 1 week Electronic Signature(s) Signed: 04/16/2022 1:08:16 PM By: Kalman Shan DO Entered By: Kalman Shan on 04/16/2022 11:28:31 -------------------------------------------------------------------------------- HxROS Details Patient Name: Date of Service: [name], [name]. 04/16/2022 10:15 A M Medical Record Number: [id number] Patient Account Number: [id number] Date of Birth/Sex: Treating RN: 1961/02/05 (61 y.o. [name] Primary Care Provider: Jenean Lindau Other Clinician: Referring Provider: Treating Provider/Extender: Elise Benne in Treatment: 6 Information Obtained From Patient Chart Hematologic/Lymphatic Medical History: Positive for: Anemia Respiratory Medical History: Positive for: Chronic Obstructive Pulmonary Disease (COPD) Cardiovascular Medical  History: Positive for: Congestive Heart Failure - Weighs self daily; Hypertension; Myocardial Infarction; Peripheral Venous Disease Gastrointestinal Medical History: Past Medical History Notes: GERD, Peptic Ulcer Disease Endocrine Medical History: Positive for: Type II Diabetes Time with diabetes: 16 years Treated with: Insulin, Oral agents Blood sugar tested every day: Yes Tested : Twice daily Musculoskeletal Medical History: Past Medical History Notes: Broke left leg 3 years ago, 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, foot fused Neurologic Medical History: Positive for: Neuropathy Psychiatric Medical History: Past Medical History Notes: Depression Immunizations Pneumococcal Vaccine: Received Pneumococcal Vaccination: No Implantable Devices None Family and Social History Cancer: Yes - Father,Mother; Diabetes: Yes - Mother,Maternal Grandparents,Paternal Grandparents; Heart Disease: Yes - Siblings,Maternal Grandparents; Hypertension: Yes - Siblings; Tuberculosis: Yes - Maternal Grandparents,Paternal Grandparents; Current every day smoker; Marital Status - Single; Alcohol Use: Never; Drug Use: No History; Caffeine Use: Daily; Financial Concerns: No; Food, Clothing or Shelter Needs: No; Support System Lacking: No; Transportation Concerns: No Electronic Signature(s) Signed: 04/16/2022 1:08:16 PM By: Kalman Shan DO Signed: 04/16/2022 4:36:00 PM By: Lorrin Jackson Entered By: Kalman Shan on 04/16/2022 11:24:56 -------------------------------------------------------------------------------- SuperBill Details Patient Name: Date of Service: [name], [name].  04/16/2022 Medical Record Number: [id number] Patient Account Number: [id number] Date of Birth/Sex: Treating RN: [birth year] (61 y.o. [name] Primary Care Provider: Jenean Lindau Other Clinician: Referring Provider: Treating Provider/Extender: Elise Benne in Treatment: 6 Diagnosis  Coding ICD-10 Codes Code Description [phone] Non-pressure chronic ulcer of other part of left foot with fat layer exposed E11.621 Type 2 diabetes mellitus with foot ulcer E66.01 Morbid (severe) obesity due to excess calories Facility Procedures CPT4 Code: [id number] Description: [id number] - DEB SUBQ TISSUE 20 SQ CM/< ICD-10 Diagnosis Description L97.522 Non-pressure chronic ulcer of other part of left foot with fat layer exposed Modifier: Quantity: 1 Physician Procedures : CPT4 Code Description Modifier [id number] - WC PHYS SUBQ TISS 20 SQ CM ICD-10 Diagnosis Description L97.522 Non-pressure chronic ulcer of other part of left foot with fat layer exposed Quantity: 1 Electronic Signature(s) Signed: 04/16/2022 1:08:16 PM By: Kalman Shan DO Signed: 04/16/2022 4:36:00 PM By: Lorrin Jackson Entered By: Lorrin Jackson on 04/16/2022 11:37:20

## 2022-04-16 NOTE — Progress Notes (Signed)
[name], [name]. ([id number]) Visit Report for 04/16/2022 Arrival Information Details Patient Name: Date of Service: [name] Crease 04/16/2022 10:15 A M Medical Record Number: [id number] Patient Account Number: [id number] Date of Birth/Sex: Treating RN: 1961-08-21 (61 y.o. [name] Primary Care Jeananne Bedwell: Jenean Lindau Other Clinician: Referring Jamol Ginyard: Treating Bronnie Vasseur/Extender: Elise Benne in Treatment: 6 Visit Information History Since Last Visit Added or deleted any medications: No Patient Arrived: [name] Any new allergies or adverse reactions: No Arrival Time: 10:40 Had a fall or experienced change in No Accompanied By: daughter activities of daily living that may affect Transfer Assistance: None risk of falls: Patient Identification Verified: Yes Signs or symptoms of abuse/neglect since last visito No Secondary Verification Process Completed: Yes Hospitalized since last visit: No Patient Requires Transmission-Based Precautions: No Implantable device outside of the clinic excluding No Patient Has Alerts: No cellular tissue based products placed in the center since last visit: Has Dressing in Place as Prescribed: Yes Has Compression in Place as Prescribed: Yes Pain Present Now: No Electronic Signature(s) Signed: 04/16/2022 4:37:01 PM By: Erenest Blank Entered By: Erenest Blank on 04/16/2022 10:42:31 -------------------------------------------------------------------------------- Compression Therapy Details Patient Name: Date of Service: [name] Lax C. 04/16/2022 10:15 A M Medical Record Number: [id number] Patient Account Number: [id number] Date of Birth/Sex: Treating RN: 1960/11/08 (60 y.o. [name] Primary Care Valinda Fedie: Jenean Lindau Other Clinician: Referring Shaniqua Guillot: Treating Corbett Moulder/Extender: Elise Benne in Treatment: 6 Compression Therapy Performed for Wound Assessment:  Wound #1 Left Calcaneus Performed By: Clinician Lorrin Jackson, RN Compression Type: Three Layer Post Procedure Diagnosis Same as Pre-procedure Electronic Signature(s) Signed: 04/16/2022 4:36:00 PM By: Lorrin Jackson Entered By: Lorrin Jackson on 04/16/2022 11:16:40 -------------------------------------------------------------------------------- Encounter Discharge Information Details Patient Name: Date of Service: [name] Lax C. 04/16/2022 10:15 A M Medical Record Number: [id number] Patient Account Number: [id number] Date of Birth/Sex: Treating RN: 1960/10/11 (61 y.o. [name] Primary Care Lovenia Debruler: Jenean Lindau Other Clinician: Referring Juno Bozard: Treating Nasiya Pascual/Extender: Elise Benne in Treatment: 6 Encounter Discharge Information Items Post Procedure Vitals Discharge Condition: Stable Temperature (F): 98.9 Ambulatory Status: Wheelchair Pulse (bpm): 71 Discharge Destination: Home Respiratory Rate (breaths/min): 18 Transportation: Private Auto Blood Pressure (mmHg): 134/80 Accompanied By: daughter Schedule Follow-up Appointment: Yes Clinical Summary of Care: Provided on 04/16/2022 Form Type Recipient Paper Patient Patient Electronic Signature(s) Signed: 04/16/2022 4:36:00 PM By: Lorrin Jackson Entered By: Lorrin Jackson on 04/16/2022 11:38:23 -------------------------------------------------------------------------------- Lower Extremity Assessment Details Patient Name: Date of Service: [name] Lax C. 04/16/2022 10:15 A M Medical Record Number: [id number] Patient Account Number: [id number] Date of Birth/Sex: Treating RN: [birth year] (61 y.o. [name] Primary Care Ramsha Lonigro: Jenean Lindau Other Clinician: Referring Cheng Dec: Treating Paisley Grajeda/Extender: Elise Benne in Treatment: 6 Edema Assessment Assessed: Shirlyn Goltz: Yes] Patrice Paradise: No] Edema: [Left: Ye] [Right: s] Calf Left:  Right: Point of Measurement: 28 cm From Medial Instep 52 cm Ankle Left: Right: Point of Measurement: 3 cm From Medial Instep 32 cm Electronic Signature(s) Signed: 04/16/2022 4:36:00 PM By: Lorrin Jackson Signed: 04/16/2022 4:37:01 PM By: Erenest Blank Entered By: Erenest Blank on 04/16/2022 10:51:28 -------------------------------------------------------------------------------- Multi Wound Chart Details Patient Name: Date of Service: [name] Lax C. 04/16/2022 10:15 A M Medical Record Number: [id number] Patient Account Number: [id number] Date of Birth/Sex: Treating RN: 10-10-1960 (61 y.o. [name] Primary Care Tamula Morrical: Jenean Lindau Other Clinician: Referring Amarie Viles: Treating Behr Cislo/Extender: Elise Benne in Treatment: 6 Vital Signs  Height(in): 66 Pulse(bpm): 71 Weight(lbs): 339 Blood Pressure(mmHg): 134/80 Body Mass Index(BMI): 54.7 Temperature(F): 98.9 Respiratory Rate(breaths/min): 18 Photos: [N/A:N/A] Left Calcaneus N/A N/A Wound Location: Other Lesion N/A N/A Wounding Event: Diabetic Wound/Ulcer of the Lower N/A N/A Primary Etiology: Extremity Anemia, Chronic Obstructive N/A N/A Comorbid History: Pulmonary Disease (COPD), Congestive Heart Failure, Hypertension, Myocardial Infarction, Peripheral Venous Disease, Type II Diabetes, Neuropathy 10/06/2021 N/A N/A Date Acquired: 6 N/A N/A Weeks of Treatment: Open N/A N/A Wound Status: No N/A N/A Wound Recurrence: 1.8x1.6x0.2 N/A N/A Measurements L x W x D (cm) 2.262 N/A N/A A (cm) : rea 0.452 N/A N/A Volume (cm) : 48.60% N/A N/A % Reduction in A rea: 48.60% N/A N/A % Reduction in Volume: Grade 2 N/A N/A Classification: Medium N/A N/A Exudate A mount: Serosanguineous N/A N/A Exudate Type: red, brown N/A N/A Exudate Color: Distinct, outline attached N/A N/A Wound Margin: Medium (34-66%) N/A N/A Granulation A mount: Pink N/A N/A Granulation  Quality: Medium (34-66%) N/A N/A Necrotic A mount: Fat Layer (Subcutaneous Tissue): Yes N/A N/A Exposed Structures: Fascia: No Tendon: No Muscle: No Joint: No Bone: No None N/A N/A Epithelialization: Debridement - Excisional N/A N/A Debridement: Pre-procedure Verification/Time Out 11:10 N/A N/A Taken: Lidocaine 4% Topical Solution N/A N/A Pain Control: Subcutaneous, Slough N/A N/A Tissue Debrided: Skin/Subcutaneous Tissue N/A N/A Level: 2.88 N/A N/A Debridement A (sq cm): rea Curette N/A N/A Instrument: Minimum N/A N/A Bleeding: Pressure N/A N/A Hemostasis A chieved: Procedure was tolerated well N/A N/A Debridement Treatment Response: 1.8x1.6x0.2 N/A N/A Post Debridement Measurements L x W x D (cm) 0.452 N/A N/A Post Debridement Volume: (cm) Compression Therapy N/A N/A Procedures Performed: Debridement Treatment Notes Electronic Signature(s) Signed: 04/16/2022 1:08:16 PM By: Geralyn Corwin DO Signed: 04/16/2022 4:36:00 PM By: Antonieta Iba Entered By: Geralyn Corwin on 04/16/2022 11:24:10 -------------------------------------------------------------------------------- Multi-Disciplinary Care Plan Details Patient Name: Date of Service: [name] Bushy C. 04/16/2022 10:15 A M Medical Record Number: [id number] Patient Account Number: [id number] Date of Birth/Sex: Treating RN: Aug 25, 1961 (61 y.o. [name] Primary Care Elwanda Moger: [name] Other Clinician: Referring Dori Devino: Treating Kaysea Raya/Extender: [name] in Treatment: 6 Active Inactive Abuse / Safety / Falls / Self Care Management Nursing Diagnoses: History of Falls Goals: Patient will not experience any injury related to falls Date Initiated: 03/05/2022 Target Resolution Date: 05/02/2022 Goal Status: Active Interventions: Assess fall risk on admission and as needed Assess: immobility, friction, shearing, incontinence upon admission and as  needed Assess impairment of mobility on admission and as needed per policy Notes: Wound/Skin Impairment Nursing Diagnoses: Impaired tissue integrity Goals: Patient/caregiver will verbalize understanding of skin care regimen Date Initiated: 03/05/2022 Target Resolution Date: 05/02/2022 Goal Status: Active Ulcer/skin breakdown will have a volume reduction of 30% by week 4 Date Initiated: 03/05/2022 Target Resolution Date: 05/02/2022 Goal Status: Active Interventions: Assess patient/caregiver ability to obtain necessary supplies Assess patient/caregiver ability to perform ulcer/skin care regimen upon admission and as needed Assess ulceration(s) every visit Provide education on smoking Provide education on ulcer and skin care Treatment Activities: Topical wound management initiated : 03/05/2022 Notes: Electronic Signature(s) Signed: 04/16/2022 11:08:42 AM By: Antonieta Iba Entered By: Antonieta Iba on 04/16/2022 11:08:41 -------------------------------------------------------------------------------- Pain Assessment Details Patient Name: Date of Service: [name] Bushy C. 04/16/2022 10:15 A M Medical Record Number: [id number] Patient Account Number: [id number] Date of Birth/Sex: Treating RN: January 21, 1961 (61 y.o. [name] Primary Care Roshanna Cimino: [name] Other Clinician: Referring Abass Misener: Treating Ameliah Baskins/Extender: [name] in Treatment:  6 Active Problems Location of Pain Severity and Description of Pain Patient Has Paino No Site Locations Pain Management and Medication Current Pain Management: Electronic Signature(s) Signed: 04/16/2022 4:36:00 PM By: Lorrin Jackson Signed: 04/16/2022 4:37:01 PM By: Erenest Blank Entered By: Erenest Blank on 04/16/2022 10:45:03 -------------------------------------------------------------------------------- Patient/Caregiver Education Details Patient Name: Date of Service: [name] Crease.  7/13/2023andnbsp10:15 [place] Record Number: [id number] Patient Account Number: [id number] Date of Birth/Gender: Treating RN: 09-02-1961 (61 y.o. [name] Primary Care Physician: Jenean Lindau Other Clinician: Referring Physician: Treating Physician/Extender: Elise Benne in Treatment: 6 Education Assessment Education Provided To: Patient Education Topics Provided Smoking and Wound Healing: Methods: Explain/Verbal, Printed Responses: State content correctly Venous: Methods: Explain/Verbal, Printed Responses: State content correctly Wound/Skin Impairment: Methods: Explain/Verbal, Printed Responses: State content correctly Electronic Signature(s) Signed: 04/16/2022 4:36:00 PM By: Lorrin Jackson Entered By: Lorrin Jackson on 04/16/2022 11:09:04 -------------------------------------------------------------------------------- Wound Assessment Details Patient Name: Date of Service: [name] Lax C. 04/16/2022 10:15 A M Medical Record Number: [id number] Patient Account Number: [id number] Date of Birth/Sex: Treating RN: [birth year] (61 y.o. [name] Primary Care Vann Okerlund: Jenean Lindau Other Clinician: Referring Eliab Closson: Treating Alejandra Barna/Extender: Elise Benne in Treatment: 6 Wound Status Wound Number: 1 Primary Diabetic Wound/Ulcer of the Lower Extremity Etiology: Wound Location: Left Calcaneus Wound Open Wounding Event: Other Lesion Status: Date Acquired: 10/06/2021 Comorbid Anemia, Chronic Obstructive Pulmonary Disease (COPD), Weeks Of Treatment: 6 History: Congestive Heart Failure, Hypertension, Myocardial Infarction, Clustered Wound: No Peripheral Venous Disease, Type II Diabetes, Neuropathy Photos Wound Measurements Length: (cm) 1.8 Width: (cm) 1.6 Depth: (cm) 0.2 Area: (cm) 2.262 Volume: (cm) 0.452 % Reduction in Area: 48.6% % Reduction in Volume: 48.6% Epithelialization:  None Tunneling: No Undermining: No Wound Description Classification: Grade 2 Wound Margin: Distinct, outline attached Exudate Amount: Medium Exudate Type: Serosanguineous Exudate Color: red, brown Foul Odor After Cleansing: No Slough/Fibrino Yes Wound Bed Granulation Amount: Medium (34-66%) Exposed Structure Granulation Quality: Pink Fascia Exposed: No Necrotic Amount: Medium (34-66%) Fat Layer (Subcutaneous Tissue) Exposed: Yes Necrotic Quality: Adherent Slough Tendon Exposed: No Muscle Exposed: No Joint Exposed: No Bone Exposed: No Treatment Notes Wound #1 (Calcaneus) Wound Laterality: Left Cleanser Soap and Water Discharge Instruction: May shower and wash wound with dial antibacterial soap and water prior to dressing change. Wound Cleanser Discharge Instruction: Cleanse the wound with wound cleanser prior to applying a clean dressing using gauze sponges, not tissue or cotton balls. Peri-Wound Care Triamcinolone 15 (g) Discharge Instruction: Use triamcinolone 15 (g) as directed Sween Lotion (Moisturizing lotion) Discharge Instruction: Apply moisturizing lotion as directed Topical Gentamicin Discharge Instruction: In office only Mupirocin Ointment Discharge Instruction: In office only Primary Dressing Hydrofera Blue Ready Foam, 2.5 x2.5 in Discharge Instruction: Apply to wound bed as instructed Secondary Dressing ABD Pad, 8x10 Discharge Instruction: Apply over primary dressing as directed. Secured With Compression Wrap ThreePress (3 layer compression wrap) Discharge Instruction: Apply three layer compression as directed. Compression Stockings Add-Ons Electronic Signature(s) Signed: 04/16/2022 4:36:00 PM By: Lorrin Jackson Signed: 04/16/2022 4:37:01 PM By: Erenest Blank Entered By: Erenest Blank on 04/16/2022 10:53:44 -------------------------------------------------------------------------------- Vitals Details Patient Name: Date of Service: [name] Lax  C. 04/16/2022 10:15 A M Medical Record Number: [id number] Patient Account Number: [id number] Date of Birth/Sex: Treating RN: 28-Aug-1961 (61 y.o. [name] Primary Care Persephanie Laatsch: Jenean Lindau Other Clinician: Referring Kersten Salmons: Treating Delcenia Inman/Extender: Elise Benne in Treatment: 6 Vital Signs Time Taken: 10:44 Temperature (F): 98.9 Height (in): 66  Pulse (bpm): 71 Weight (lbs): 339 Respiratory Rate (breaths/min): 18 Body Mass Index (BMI): 54.7 Blood Pressure (mmHg): 134/80 Reference Range: 80 - 120 mg / dl Electronic Signature(s) Signed: 04/16/2022 4:37:01 PM By: Thayer Dallas Entered By: Thayer Dallas on 04/16/2022 10:44:53

## 2022-04-23 ENCOUNTER — Encounter (HOSPITAL_BASED_OUTPATIENT_CLINIC_OR_DEPARTMENT_OTHER): Payer: 59 | Admitting: Internal Medicine

## 2022-04-23 DIAGNOSIS — L97522 Non-pressure chronic ulcer of other part of left foot with fat layer exposed: Secondary | ICD-10-CM | POA: Diagnosis not present

## 2022-04-23 DIAGNOSIS — E11621 Type 2 diabetes mellitus with foot ulcer: Secondary | ICD-10-CM | POA: Diagnosis not present

## 2022-04-23 NOTE — Progress Notes (Signed)
[name], [name]. ([id number]) Visit Report for 04/23/2022 Chief Complaint Document Details Patient Name: Date of Service: [name], [name] MY C. 04/23/2022 12:30 PM Medical Record Number: [id number] Patient Account Number: [id number] Date of Birth/Sex: Treating RN: 07/07/1961 (61 y.o. [name]) [name], [name] Primary Care Provider: Junious Dresserampbell, Stephen Other Clinician: Referring Provider: Treating Provider/Extender: Andree MoroHoffman, Cayne Yom Campbell, Stephen Weeks in Treatment: 7 Information Obtained from: Patient Chief Complaint 03/05/2022; left foot wound Electronic Signature(s) Signed: 04/23/2022 2:00:38 PM By: Geralyn CorwinHoffman, Burhan Barham DO Entered By: Geralyn CorwinHoffman, Mariaceleste Herrera on 04/23/2022 13:25:59 -------------------------------------------------------------------------------- Debridement Details Patient Name: Date of Service: [name], [name] MY C. 04/23/2022 12:30 PM Medical Record Number: [id number] Patient Account Number: [id number] Date of Birth/Sex: Treating RN: 07/07/1961 (61 y.o. [name]) [name], [name] Primary Care Provider: Junious Dresserampbell, Stephen Other Clinician: Referring Provider: Treating Provider/Extender: Andree MoroHoffman, Ziare Orrick Campbell, Stephen Weeks in Treatment: 7 Debridement Performed for Assessment: Wound #1 Left Calcaneus Performed By: Physician Geralyn CorwinHoffman, Lanyiah Brix, DO Debridement Type: Debridement Severity of Tissue Pre Debridement: Fat layer exposed Level of Consciousness (Pre-procedure): Awake and Alert Pre-procedure Verification/Time Out Yes - 12:57 Taken: Start Time: 12:58 T Area Debrided (L x W): otal 2.1 (cm) x 1.4 (cm) = 2.94 (cm) Tissue and other material debrided: Non-Viable, Slough, Subcutaneous, Biofilm, Slough Level: Skin/Subcutaneous Tissue Debridement Description: Excisional Instrument: Curette Bleeding: Minimum Hemostasis Achieved: Pressure End Time: 13:02 Response to Treatment: Procedure was tolerated well Level of Consciousness (Post- Awake and Alert procedure): Post Debridement Measurements of  Total Wound Length: (cm) 2.1 Width: (cm) 1.4 Depth: (cm) 0.2 Volume: (cm) 0.462 Character of Wound/Ulcer Post Debridement: Stable Severity of Tissue Post Debridement: Fat layer exposed Post Procedure Diagnosis Same as Pre-procedure Electronic Signature(s) Signed: 04/23/2022 2:00:38 PM By: Geralyn CorwinHoffman, Lacrecia Delval DO Signed: 04/23/2022 2:42:53 PM By: Antonieta IbaBarnhart, [name] Entered By: Antonieta IbaBarnhart, [name] on 04/23/2022 13:02:35 -------------------------------------------------------------------------------- HPI Details Patient Name: Date of Service: [name], [name] MY C. 04/23/2022 12:30 PM Medical Record Number: [id number] Patient Account Number: [id number] Date of Birth/Sex: Treating RN: 07/07/1961 (61 y.o. [name]) [name], [name] Primary Care Provider: Junious Dresserampbell, Stephen Other Clinician: Referring Provider: Treating Provider/Extender: Andree MoroHoffman, Asuka Dusseau Campbell, Stephen Weeks in Treatment: 7 History of Present Illness HPI Description: Admission 03/05/2022 [name] is [name] 61 year old female with [name] past medical history of uncontrolled insulin-dependent type 2 diabetes, tobacco user and chronic diastolic heart failure that presents to the clinic for [name] history of wound to her left heel. She states she had [name] left ankle fusion in September 2022. She states that she always had [name] wound after the surgery and it never healed. She has home health and they have been doing compression wraps along with silver alginate to the wound bed. She currently denies signs of infection. 6/8; this is [name] 61 year old woman with type 2 diabetes. She developed [name] wound on her left Achilles heel just above the tip of the heel in the setting of recurrent ankle surgeries in late 2022. I have seen some of these results from either the cast or the surgical boots that are put on after these operations. She thinks this may be the case. And [name] sense of pressure ulcer. In any case that she has been using Santyl Hydrofera Blue under compression. She  is not wearing any footwear at home as she cannot find anything to accommodate the wrap 6/22; patient presents for follow-up. She has home health that comes out once [name] week to help with dressing changes. She has no issues or complaints today. We have been using Hydrofera Blue under compression therapy. 7/6; patient presents for  follow-up. She states that home health did not come out for the past 2 weeks. It is unclear why. We have been using Hydrofera Blue and Santyl under compression therapy. 7/13; patient presents for follow-up. She did not take the oral antibiotics prescribed at last clinic visit. We have been using Hydrofera Blue with gentamicin/mupirocin ointment under compression therapy. She has no issues or complaints today. She denies signs of infection. 7/20; patient presents for follow-up. We have been using Hydrofera Blue with antibiotic ointment under 3 layer compression. She has home health that change the dressing once. They put collagen on the wound bed. She also reports falling yesterday and hitting her right foot. She has no pain to the area today. Electronic Signature(s) Signed: 04/23/2022 2:00:38 PM By: Geralyn Corwin DO Entered By: Geralyn Corwin on 04/23/2022 13:26:50 -------------------------------------------------------------------------------- Physical Exam Details Patient Name: Date of Service: [name] C. 04/23/2022 12:30 PM Medical Record Number: [id number] Patient Account Number: [id number] Date of Birth/Sex: Treating RN: [birth year] (61 y.o. [name] Cluck Primary Care Provider: [name] Other Clinician: Referring Provider: Treating Provider/Extender: [name] in Treatment: 7 Constitutional respirations regular, non-labored and within target range for patient.. Cardiovascular 2+ dorsalis pedis/posterior tibialis pulses. Psychiatric pleasant and cooperative. Notes Left foot: T the posterior ankle there is  an open wound with granulation tissue and nonviable tissue. No surrounding signs of infection. o Electronic Signature(s) Signed: 04/23/2022 2:00:38 PM By: Geralyn Corwin DO Entered By: Geralyn Corwin on 04/23/2022 13:27:35 -------------------------------------------------------------------------------- Physician Orders Details Patient Name: Date of Service: [name] C. 04/23/2022 12:30 PM Medical Record Number: [id number] Patient Account Number: [id number] Date of Birth/Sex: Treating RN: [birth year] (62 y.o. [name] Cluck Primary Care Provider: [name] Other Clinician: Referring Provider: Treating Provider/Extender: [name] in Treatment: 7 Verbal / Phone Orders: No Diagnosis Coding ICD-10 Coding Code Description [phone] Non-pressure chronic ulcer of other part of left foot with fat layer exposed E11.621 Type 2 diabetes mellitus with foot ulcer E66.01 Morbid (severe) obesity due to excess calories Follow-up Appointments ppointment in 1 week. - Thursday 04/30/22 @ 2:45p Dr. Mikey Bussing and Lennox Laity (Room # 7) Return [name] Bathing/ Shower/ Hygiene May shower with protection but do not get wound dressing(s) wet. - May use cast protector bag from [facility], [facility] or [employer] Edema Control - Lymphedema / SCD / Other Elevate legs to the level of the heart or above for 30 minutes daily and/or when sitting, [name] frequency of: Avoid standing for long periods of time. Additional Orders / Instructions Follow Nutritious Diet - Monitor/Control Blood Sugar Home Health New wound care orders this week; continue Home Health for wound care. May utilize formulary equivalent dressing for wound treatment orders unless otherwise specified. - Skilled nursing for wound care 1x and wrap changed in office 1x week. every other week when not coming into wound center home health to change twice [name] week. Other Home Health Orders/Instructions: - [facility] Wound  Treatment Wound #1 - Calcaneus Wound Laterality: Left Cleanser: Soap and Water (Home Health) 2 x Per Week/30 Days Discharge Instructions: May shower and wash wound with dial antibacterial soap and water prior to dressing change. Cleanser: Wound Cleanser 2 x Per Week/30 Days Discharge Instructions: Cleanse the wound with wound cleanser prior to applying [name] clean dressing using gauze sponges, not tissue or cotton balls. Peri-Wound Care: Triamcinolone 15 (g) 2 x Per Week/30 Days Discharge Instructions: Use triamcinolone 15 (g) as directed Peri-Wound Care: Sween Lotion (Moisturizing lotion) (Home  Health) 2 x Per Week/30 Days Discharge Instructions: Apply moisturizing lotion as directed Prim Dressing: Hydrofera Blue Ready Foam, 2.5 x2.5 in (Home Health) 2 x Per Week/30 Days ary Discharge Instructions: Apply to wound bed as instructed Prim Dressing: Santyl Ointment 2 x Per Week/30 Days ary Discharge Instructions: In Office Only Secondary Dressing: ABD Pad, 8x10 (Home Health) 2 x Per Week/30 Days Discharge Instructions: Apply over primary dressing as directed. Compression Wrap: ThreePress (3 layer compression wrap) (Home Health) 2 x Per Week/30 Days Discharge Instructions: Apply three layer compression as directed. Electronic Signature(s) Signed: 04/23/2022 2:00:38 PM By: Geralyn Corwin DO Entered By: Geralyn Corwin on 04/23/2022 13:27:52 -------------------------------------------------------------------------------- Problem List Details Patient Name: Date of Service: [name] C. 04/23/2022 12:30 PM Medical Record Number: [id number] Patient Account Number: [id number] Date of Birth/Sex: Treating RN: 01-13-1961 (61 y.o. [name] Cluck Primary Care Provider: [name] Other Clinician: Referring Provider: Treating Provider/Extender: [name] in Treatment: 7 Active Problems ICD-10 Encounter Code Description Active Date  MDM Diagnosis [phone] Non-pressure chronic ulcer of other part of left foot with fat layer exposed 03/05/2022 No Yes E11.621 Type 2 diabetes mellitus with foot ulcer 03/05/2022 No Yes E66.01 Morbid (severe) obesity due to excess calories 03/05/2022 No Yes Inactive Problems Resolved Problems Electronic Signature(s) Signed: 04/23/2022 2:00:38 PM By: Geralyn Corwin DO Previous Signature: 04/23/2022 12:28:08 PM Version By: Antonieta Iba Entered By: Geralyn Corwin on 04/23/2022 13:25:22 -------------------------------------------------------------------------------- Progress Note Details Patient Name: Date of Service: [name] C. 04/23/2022 12:30 PM Medical Record Number: [id number] Patient Account Number: [id number] Date of Birth/Sex: Treating RN: [birth year] (61 y.o. [name] Cluck Primary Care Provider: [name] Other Clinician: Referring Provider: Treating Provider/Extender: [name] in Treatment: 7 Subjective Chief Complaint Information obtained from Patient 03/05/2022; left foot wound History of Present Illness (HPI) Admission 03/05/2022 Ms. Canaan Manger is [name] 61 year old female with [name] past medical history of uncontrolled insulin-dependent type 2 diabetes, tobacco user and chronic diastolic heart failure that presents to the clinic for [name] 5-month history of wound to her left heel. She states she had [name] left ankle fusion in September 2022. She states that she always had [name] wound after the surgery and it never healed. She has home health and they have been doing compression wraps along with silver alginate to the wound bed. She currently denies signs of infection. 6/8; this is [name] 62 year old woman with type 2 diabetes. She developed [name] wound on her left Achilles heel just above the tip of the heel in the setting of recurrent ankle surgeries in late 2022. I have seen some of these results from either the cast or the surgical boots that are put on  after these operations. She thinks this may be the case. And [name] sense of pressure ulcer. In any case that she has been using Santyl Hydrofera Blue under compression. She is not wearing any footwear at home as she cannot find anything to accommodate the wrap 6/22; patient presents for follow-up. She has home health that comes out once [name] week to help with dressing changes. She has no issues or complaints today. We have been using Hydrofera Blue under compression therapy. 7/6; patient presents for follow-up. She states that home health did not come out for the past 2 weeks. It is unclear why. We have been using Hydrofera Blue and Santyl under compression therapy. 7/13; patient presents for follow-up. She did not take the oral antibiotics prescribed at last clinic visit.  We have been using Hydrofera Blue with gentamicin/mupirocin ointment under compression therapy. She has no issues or complaints today. She denies signs of infection. 7/20; patient presents for follow-up. We have been using Hydrofera Blue with antibiotic ointment under 3 layer compression. She has home health that change the dressing once. They put collagen on the wound bed. She also reports falling yesterday and hitting her right foot. She has no pain to the area today. Patient History Information obtained from Patient, Chart. Family History Cancer - Father,Mother, Diabetes - Mother,Maternal Grandparents,Paternal Grandparents, Heart Disease - Siblings,Maternal Grandparents, Hypertension - Siblings, Tuberculosis - Maternal Grandparents,Paternal Grandparents. Social History Current every day smoker, Marital Status - Single, Alcohol Use - Never, Drug Use - No History, Caffeine Use - Daily. 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 Vitals Time Taken: 12:38 PM, Height: 66 in, Weight: 339 lbs, BMI: 54.7, Temperature: 98.2 F, Pulse: 74 bpm, Respiratory Rate: 18 breaths/min, Blood Pressure: 129/77 mmHg, Capillary Blood Glucose: 134 mg/dl. Cardiovascular 2+ dorsalis pedis/posterior tibialis pulses. Psychiatric pleasant and cooperative. General Notes: Left foot: T the posterior ankle there is an open wound with granulation tissue and nonviable tissue. No surrounding signs of infection. o Integumentary (Hair, Skin) Wound #1 status is Open. Original cause of wound was Other Lesion. The date acquired was: 10/06/2021. The wound has been in treatment 7 weeks. The wound is located on the Left Calcaneus. The wound measures 2.1cm length x 1.4cm width x 0.2cm depth; 2.309cm^2 area and 0.462cm^3 volume. There is Fat Layer (Subcutaneous Tissue) exposed. There is no tunneling or undermining noted. There is [name] medium amount of serosanguineous drainage noted. The wound margin is distinct with the outline attached to the wound base. There is large (67-100%) red, pink granulation within the wound bed. There is [name] small (1-33%) amount of necrotic tissue within the wound bed including Adherent Slough. Assessment Active Problems ICD-10 Non-pressure chronic ulcer of other part of left foot with fat layer exposed Type 2 diabetes mellitus with foot ulcer Morbid (severe) obesity due to excess calories Patient's wound is stable. I debrided nonviable tissue. I recommended continuing Hydrofera Blue and we will add Santyl in  office only under the compression wrap. Procedures Wound #1 Pre-procedure diagnosis of Wound #1 is [name] Diabetic Wound/Ulcer of the Lower Extremity located on the Left Calcaneus .Severity of Tissue Pre Debridement is: Fat layer exposed. There was [name] Excisional Skin/Subcutaneous Tissue Debridement with [name] total area of 2.94 sq cm performed by Geralyn Corwin, DO. With the following instrument(s): Curette to remove Non-Viable tissue/material. Material removed includes Subcutaneous Tissue, Slough, and Biofilm. No specimens were taken. [name] time out was conducted at 12:57, prior to the start of the procedure. [name] Minimum amount of bleeding was controlled with Pressure. The procedure was tolerated well. Post Debridement Measurements: 2.1cm length x 1.4cm width x 0.2cm depth; 0.462cm^3 volume. Character of Wound/Ulcer Post Debridement is stable. Severity of Tissue Post Debridement is: Fat layer exposed. Post procedure Diagnosis Wound #1: Same as Pre-Procedure Pre-procedure diagnosis of Wound #1 is [name] Diabetic Wound/Ulcer  of the Lower Extremity located on the Left Calcaneus . There was [name] Three Layer Compression Therapy Procedure by Antonieta Iba, RN. Post procedure Diagnosis Wound #1: Same as Pre-Procedure Plan Follow-up Appointments: Return Appointment in 1 week. - Thursday 04/30/22 @ 2:45p Dr. Mikey Bussing and Lennox Laity (Room # 7) Bathing/ Shower/ Hygiene: May shower with protection but do not get wound dressing(s) wet. - May use cast protector bag from [facility], [facility] or [employer] Edema Control - Lymphedema / SCD / Other: Elevate legs to the level of the heart or above for 30 minutes daily and/or when sitting, [name] frequency of: Avoid standing for long periods of time. Additional Orders / Instructions: Follow Nutritious Diet - Monitor/Control Blood Sugar Home Health: New wound care orders this week; continue Home Health for wound care. May utilize formulary equivalent dressing for wound treatment orders unless otherwise  specified. - Skilled nursing for wound care 1x and wrap changed in office 1x week. every other week when not coming into wound center home health to change twice [name] week. Other Home Health Orders/Instructions: - [facility] WOUND #1: - Calcaneus Wound Laterality: Left Cleanser: Soap and Water (Home Health) 2 x Per Week/30 Days Discharge Instructions: May shower and wash wound with dial antibacterial soap and water prior to dressing change. Cleanser: Wound Cleanser 2 x Per Week/30 Days Discharge Instructions: Cleanse the wound with wound cleanser prior to applying [name] clean dressing using gauze sponges, not tissue or cotton balls. Peri-Wound Care: Triamcinolone 15 (g) 2 x Per Week/30 Days Discharge Instructions: Use triamcinolone 15 (g) as directed Peri-Wound Care: Sween Lotion (Moisturizing lotion) (Home Health) 2 x Per Week/30 Days Discharge Instructions: Apply moisturizing lotion as directed Prim Dressing: Hydrofera Blue Ready Foam, 2.5 x2.5 in (Home Health) 2 x Per Week/30 Days ary Discharge Instructions: Apply to wound bed as instructed Prim Dressing: Santyl Ointment 2 x Per Week/30 Days ary Discharge Instructions: In Office Only Secondary Dressing: ABD Pad, 8x10 (Home Health) 2 x Per Week/30 Days Discharge Instructions: Apply over primary dressing as directed. Com pression Wrap: ThreePress (3 layer compression wrap) (Home Health) 2 x Per Week/30 Days Discharge Instructions: Apply three layer compression as directed. 1. In office sharp debridement 2. Hydrofera Blue under 3 layer compression 3. Follow-up in 1 week Electronic Signature(s) Signed: 04/23/2022 2:00:38 PM By: Geralyn Corwin DO Entered By: Geralyn Corwin on 04/23/2022 13:28:44 -------------------------------------------------------------------------------- HxROS Details Patient Name: Date of Service: [name], [name] MY C. 04/23/2022 12:30 PM Medical Record Number: [id number] Patient Account Number: [id number] Date of  Birth/Sex: Treating RN: December 28, 1960 (61 y.o. [name] Cluck Primary Care Provider: [name] Other Clinician: Referring Provider: Treating Provider/Extender: [name] in Treatment: 7 Information Obtained From Patient Chart Hematologic/Lymphatic Medical History: Positive for: Anemia Respiratory Medical History: Positive for: Chronic Obstructive Pulmonary Disease (COPD) Cardiovascular Medical History: Positive for: Congestive Heart Failure - Weighs self daily; Hypertension; Myocardial Infarction; Peripheral Venous Disease Gastrointestinal Medical History: Past Medical History Notes: GERD, Peptic Ulcer Disease Endocrine Medical History: Positive for: Type II Diabetes Time with diabetes: 16 years Treated with: Insulin, Oral agents Blood sugar tested every day: Yes Tested : Twice daily Musculoskeletal Medical History: Past Medical History Notes: Broke left leg 3 years ago, 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, foot fused Neurologic Medical History: Positive for: Neuropathy Psychiatric Medical History: Past Medical History Notes: Depression Immunizations Pneumococcal Vaccine: Received Pneumococcal Vaccination: No Implantable Devices None Family and Social History Cancer: Yes - Father,Mother; Diabetes: Yes - Mother,Maternal Grandparents,Paternal Grandparents; Heart Disease: Yes - Siblings,Maternal Grandparents;  Hypertension: Yes - Siblings; Tuberculosis: Yes - Maternal Grandparents,Paternal Grandparents; Current every day smoker; Marital Status - Single; Alcohol Use: Never; Drug Use: No History; Caffeine Use: Daily; Financial Concerns: No; Food, Clothing or Shelter Needs: No; Support System Lacking: No; Transportation Concerns: No Electronic Signature(s) Signed: 04/23/2022 2:00:38 PM By: Geralyn Corwin DO Signed: 04/23/2022 2:42:53 PM By: Antonieta Iba Entered By: Geralyn Corwin on 04/23/2022  13:27:07 -------------------------------------------------------------------------------- SuperBill Details Patient Name: Date of Service: [name] C. 04/23/2022 Medical Record Number: [id number] Patient Account Number: [id number] Date of Birth/Sex: Treating RN: [birth year] (61 y.o. [name] Cluck Primary Care Provider: [name] Other Clinician: Referring Provider: Treating Provider/Extender: [name] in Treatment: 7 Diagnosis Coding ICD-10 Codes Code Description [phone] Non-pressure chronic ulcer of other part of left foot with fat layer exposed E11.621 Type 2 diabetes mellitus with foot ulcer E66.01 Morbid (severe) obesity due to excess calories Facility Procedures CPT4 Code: [id number] Description: 11042 - DEB SUBQ TISSUE 20 SQ CM/< ICD-10 Diagnosis Description L97.522 Non-pressure chronic ulcer of other part of left foot with fat layer exposed Modifier: Quantity: 1 Physician Procedures : CPT4 Code Description Modifier [id number] 11042 - WC PHYS SUBQ TISS 20 SQ CM ICD-10 Diagnosis Description L97.522 Non-pressure chronic ulcer of other part of left foot with fat layer exposed Quantity: 1 Electronic Signature(s) Signed: 04/23/2022 2:00:38 PM By: Geralyn Corwin DO Entered By: Geralyn Corwin on 04/23/2022 13:28:54

## 2022-04-23 NOTE — Progress Notes (Addendum)
Medley, Victoriana C. ([id number]) Visit Report for 04/23/2022 Arrival Information Details Patient Name: Date of Service: [name] 04/23/2022 12:30 PM Medical Record Number: [id number] Patient Account Number: [id number] Date of Birth/Sex: Treating RN: 10-Oct-1960 (61 y.o. [name] Primary Care Kelcey Wickstrom: [name] Other Clinician: Referring Jnae Thomaston: Treating Marcellis Frampton/Extender: [name] in Treatment: 7 Visit Information History Since Last Visit Added or deleted any medications: No Patient Arrived: Wheel Chair Any new allergies or adverse reactions: No Arrival Time: 12:37 Had a fall or experienced change in Yes Accompanied By: son activities of daily living that may affect Transfer Assistance: Manual risk of falls: Patient Identification Verified: Yes Signs or symptoms of abuse/neglect since last visito No Secondary Verification Process Completed: Yes Hospitalized since last visit: No Patient Requires Transmission-Based Precautions: No Implantable device outside of the clinic excluding No Patient Has Alerts: No cellular tissue based products placed in the center since last visit: Has Dressing in Place as Prescribed: Yes Has Compression in Place as Prescribed: Yes Pain Present Now: No Electronic Signature(s) Signed: 04/23/2022 2:42:53 PM By: Antonieta Iba Entered By: Antonieta Iba on 04/23/2022 12:38:07 -------------------------------------------------------------------------------- Compression Therapy Details Patient Name: Date of Service: [name] Bushy C. 04/23/2022 12:30 PM Medical Record Number: [id number] Patient Account Number: [id number] Date of Birth/Sex: Treating RN: 1961-01-04 (61 y.o. [name] Primary Care Tanith Dagostino: [name] Other Clinician: Referring Marckus Hanover: Treating Melodye Swor/Extender: [name] in Treatment: 7 Compression Therapy Performed for Wound Assessment:  Wound #1 Left Calcaneus Performed By: Clinician Antonieta Iba, RN Compression Type: Three Layer Post Procedure Diagnosis Same as Pre-procedure Electronic Signature(s) Signed: 04/23/2022 2:42:53 PM By: Antonieta Iba Entered By: Antonieta Iba on 04/23/2022 12:51:29 -------------------------------------------------------------------------------- Encounter Discharge Information Details Patient Name: Date of Service: [name] Bushy C. 04/23/2022 12:30 PM Medical Record Number: [id number] Patient Account Number: [id number] Date of Birth/Sex: Treating RN: 12-30-1960 (61 y.o. [name] Primary Care Jaxx Huish: [name] Other Clinician: Referring Tyress Loden: Treating Van Seymore/Extender: [name] in Treatment: 7 Encounter Discharge Information Items Post Procedure Vitals Discharge Condition: Stable Temperature (F): 98.2 Ambulatory Status: Wheelchair Pulse (bpm): 74 Discharge Destination: Home Respiratory Rate (breaths/min): 18 Transportation: Private Auto Blood Pressure (mmHg): 129/77 Accompanied By: son Schedule Follow-up Appointment: Yes Clinical Summary of Care: Provided on 04/23/2022 Form Type Recipient Paper Patient Patient Electronic Signature(s) Signed: 04/23/2022 2:42:53 PM By: Antonieta Iba Entered By: Antonieta Iba on 04/23/2022 13:21:44 -------------------------------------------------------------------------------- Lower Extremity Assessment Details Patient Name: Date of Service: [name] Bushy C. 04/23/2022 12:30 PM Medical Record Number: [id number] Patient Account Number: [id number] Date of Birth/Sex: Treating RN: [birth year] (61 y.o. [name] Primary Care Yulian Gosney: [name] Other Clinician: Referring Damyia Strider: Treating Desera Graffeo/Extender: [name] in Treatment: 7 Edema Assessment Assessed: [name]: Yes] [name]: No] Edema: [Left: Ye] [Right: s] Calf Left:  Right: Point of Measurement: 28 cm From Medial Instep 51.4 cm Ankle Left: Right: Point of Measurement: 3 cm From Medial Instep 31.5 cm Vascular Assessment Pulses: Dorsalis Pedis Palpable: [Left:Yes] Electronic Signature(s) Signed: 04/23/2022 2:42:53 PM By: Antonieta Iba Entered By: Antonieta Iba on 04/23/2022 12:45:17 -------------------------------------------------------------------------------- Multi Wound Chart Details Patient Name: Date of Service: [name] Bushy C. 04/23/2022 12:30 PM Medical Record Number: [id number] Patient Account Number: [id number] Date of Birth/Sex: Treating RN: 1961-07-02 (61 y.o. [name] Primary Care Farhad Burleson: [name] Other Clinician: Referring Khyle Goodell: Treating Tobie Hellen/Extender: [name] in Treatment: 7 Vital Signs Height(in): 66 Capillary Blood  Glucose(mg/dl): 134 Weight(lbs): 339 Pulse(bpm): 71 Body Mass Index(BMI): 54.7 Blood Pressure(mmHg): 129/77 Temperature(F): 98.2 Respiratory Rate(breaths/min): 18 Photos: [N/A:N/A] Left Calcaneus N/A N/A Wound Location: Other Lesion N/A N/A Wounding Event: Diabetic Wound/Ulcer of the Lower N/A N/A Primary Etiology: Extremity Anemia, Chronic Obstructive N/A N/A Comorbid History: Pulmonary Disease (COPD), Congestive Heart Failure, Hypertension, Myocardial Infarction, Peripheral Venous Disease, Type II Diabetes, Neuropathy 10/06/2021 N/A N/A Date Acquired: 7 N/A N/A Weeks of Treatment: Open N/A N/A Wound Status: No N/A N/A Wound Recurrence: 2.1x1.4x0.2 N/A N/A Measurements L x W x D (cm) 2.309 N/A N/A A (cm) : rea 0.462 N/A N/A Volume (cm) : 47.50% N/A N/A % Reduction in A rea: 47.50% N/A N/A % Reduction in Volume: Grade 2 N/A N/A Classification: Medium N/A N/A Exudate A mount: Serosanguineous N/A N/A Exudate Type: red, brown N/A N/A Exudate Color: Distinct, outline attached N/A N/A Wound Margin: Large (67-100%)  N/A N/A Granulation A mount: Red, Pink N/A N/A Granulation Quality: Small (1-33%) N/A N/A Necrotic A mount: Fat Layer (Subcutaneous Tissue): Yes N/A N/A Exposed Structures: Fascia: No Tendon: No Muscle: No Joint: No Bone: No Medium (34-66%) N/A N/A Epithelialization: Debridement - Excisional N/A N/A Debridement: Pre-procedure Verification/Time Out 12:57 N/A N/A Taken: Subcutaneous, Slough N/A N/A Tissue Debrided: Skin/Subcutaneous Tissue N/A N/A Level: 2.94 N/A N/A Debridement A (sq cm): rea Curette N/A N/A Instrument: Minimum N/A N/A Bleeding: Pressure N/A N/A Hemostasis A chieved: Procedure was tolerated well N/A N/A Debridement Treatment Response: 2.1x1.4x0.2 N/A N/A Post Debridement Measurements L x W x D (cm) 0.462 N/A N/A Post Debridement Volume: (cm) Compression Therapy N/A N/A Procedures Performed: Debridement Treatment Notes Wound #1 (Calcaneus) Wound Laterality: Left Cleanser Soap and Water Discharge Instruction: May shower and wash wound with dial antibacterial soap and water prior to dressing change. Wound Cleanser Discharge Instruction: Cleanse the wound with wound cleanser prior to applying a clean dressing using gauze sponges, not tissue or cotton balls. Peri-Wound Care Triamcinolone 15 (g) Discharge Instruction: Use triamcinolone 15 (g) as directed Sween Lotion (Moisturizing lotion) Discharge Instruction: Apply moisturizing lotion as directed Topical Primary Dressing Hydrofera Blue Ready Foam, 2.5 x2.5 in Discharge Instruction: Apply to wound bed as instructed Santyl Ointment Discharge Instruction: In Office Only Secondary Dressing ABD Pad, 8x10 Discharge Instruction: Apply over primary dressing as directed. Secured With Compression Wrap ThreePress (3 layer compression wrap) Discharge Instruction: Apply three layer compression as directed. Compression Stockings Add-Ons Electronic Signature(s) Signed: 04/23/2022 2:00:38 PM By:  Kalman Shan DO Signed: 04/23/2022 2:42:53 PM By: Lorrin Jackson Entered By: Kalman Shan on 04/23/2022 13:25:30 -------------------------------------------------------------------------------- Multi-Disciplinary Care Plan Details Patient Name: Date of Service: [name] Lax C. 04/23/2022 12:30 PM Medical Record Number: [id number] Patient Account Number: [id number] Date of Birth/Sex: Treating RN: [birth year] (61 y.o. [name] Primary Care Daveah Varone: Jenean Lindau Other Clinician: Referring Christyn Gutkowski: Treating Tausha Milhoan/Extender: Elise Benne in Treatment: 7 Active Inactive Abuse / Safety / Falls / Self Care Management Nursing Diagnoses: History of Falls Goals: Patient will not experience any injury related to falls Date Initiated: 03/05/2022 Target Resolution Date: 05/02/2022 Goal Status: Active Interventions: Assess fall risk on admission and as needed Assess: immobility, friction, shearing, incontinence upon admission and as needed Assess impairment of mobility on admission and as needed per policy Notes: Wound/Skin Impairment Nursing Diagnoses: Impaired tissue integrity Goals: Patient/caregiver will verbalize understanding of skin care regimen Date Initiated: 03/05/2022 Target Resolution Date: 05/02/2022 Goal Status: Active Ulcer/skin breakdown will have a volume reduction of 30% by week 4  Date Initiated: 03/05/2022 Target Resolution Date: 05/02/2022 Goal Status: Active Interventions: Assess patient/caregiver ability to obtain necessary supplies Assess patient/caregiver ability to perform ulcer/skin care regimen upon admission and as needed Assess ulceration(s) every visit Provide education on smoking Provide education on ulcer and skin care Treatment Activities: Topical wound management initiated : 03/05/2022 Notes: Electronic Signature(s) Signed: 04/23/2022 12:28:21 PM By: Antonieta Iba Entered By: Antonieta Iba on  04/23/2022 12:28:21 -------------------------------------------------------------------------------- Pain Assessment Details Patient Name: Date of Service: [name] Bushy C. 04/23/2022 12:30 PM Medical Record Number: [id number] Patient Account Number: [id number] Date of Birth/Sex: Treating RN: 03-27-1961 (61 y.o. [name] Primary Care Mihcael Ledee: [name] Other Clinician: Referring Beronica Lansdale: Treating Zanyia Silbaugh/Extender: [name] in Treatment: 7 Active Problems Location of Pain Severity and Description of Pain Patient Has Paino No Site Locations Pain Management and Medication Current Pain Management: Electronic Signature(s) Signed: 04/23/2022 2:42:53 PM By: Antonieta Iba Entered By: Antonieta Iba on 04/23/2022 12:44:20 -------------------------------------------------------------------------------- Patient/Caregiver Education Details Patient Name: Date of Service: [name] 7/20/2023andnbsp12:30 PM Medical Record Number: [id number] Patient Account Number: [id number] Date of Birth/Gender: Treating RN: 27-Feb-1961 (61 y.o. [name] Primary Care Physician: [name] Other Clinician: Referring Physician: Treating Physician/Extender: [name] in Treatment: 7 Education Assessment Education Provided To: Patient Education Topics Provided Smoking and Wound Healing: Methods: Explain/Verbal, Printed Responses: State content correctly Wound/Skin Impairment: Methods: Explain/Verbal, Printed Responses: State content correctly Electronic Signature(s) Signed: 04/23/2022 2:42:53 PM By: Antonieta Iba Entered By: Antonieta Iba on 04/23/2022 12:28:42 -------------------------------------------------------------------------------- Wound Assessment Details Patient Name: Date of Service: [name] Bushy C. 04/23/2022 12:30 PM Medical Record Number: [id number] Patient Account Number:  [id number] Date of Birth/Sex: Treating RN: 09-03-1961 (61 y.o. [name] Primary Care Laverna Dossett: [name] Other Clinician: Referring Nimsi Males: Treating Emil Klassen/Extender: [name] in Treatment: 7 Wound Status Wound Number: 1 Primary Diabetic Wound/Ulcer of the Lower Extremity Etiology: Wound Location: Left Calcaneus Wound Open Wounding Event: Other Lesion Status: Date Acquired: 10/06/2021 Comorbid Anemia, Chronic Obstructive Pulmonary Disease (COPD), Weeks Of Treatment: 7 History: Congestive Heart Failure, Hypertension, Myocardial Infarction, Clustered Wound: No Peripheral Venous Disease, Type II Diabetes, Neuropathy Photos Wound Measurements Length: (cm) 2.1 Width: (cm) 1.4 Depth: (cm) 0.2 Area: (cm) 2.309 Volume: (cm) 0.462 % Reduction in Area: 47.5% % Reduction in Volume: 47.5% Epithelialization: Medium (34-66%) Tunneling: No Undermining: No Wound Description Classification: Grade 2 Wound Margin: Distinct, outline attached Exudate Amount: Medium Exudate Type: Serosanguineous Exudate Color: red, brown Foul Odor After Cleansing: No Slough/Fibrino Yes Wound Bed Granulation Amount: Large (67-100%) Exposed Structure Granulation Quality: Red, Pink Fascia Exposed: No Necrotic Amount: Small (1-33%) Fat Layer (Subcutaneous Tissue) Exposed: Yes Necrotic Quality: Adherent Slough Tendon Exposed: No Muscle Exposed: No Joint Exposed: No Bone Exposed: No Treatment Notes Wound #1 (Calcaneus) Wound Laterality: Left Cleanser Soap and Water Discharge Instruction: May shower and wash wound with dial antibacterial soap and water prior to dressing change. Wound Cleanser Discharge Instruction: Cleanse the wound with wound cleanser prior to applying a clean dressing using gauze sponges, not tissue or cotton balls. Peri-Wound Care Triamcinolone 15 (g) Discharge Instruction: Use triamcinolone 15 (g) as directed Sween Lotion  (Moisturizing lotion) Discharge Instruction: Apply moisturizing lotion as directed Topical Primary Dressing Hydrofera Blue Ready Foam, 2.5 x2.5 in Discharge Instruction: Apply to wound bed as instructed Santyl Ointment Discharge Instruction: In Office Only Secondary Dressing ABD Pad, 8x10 Discharge Instruction: Apply over primary dressing as directed. Secured With Compression Wrap ThreePress (  3 layer compression wrap) Discharge Instruction: Apply three layer compression as directed. Compression Stockings Add-Ons Electronic Signature(s) Signed: 04/23/2022 2:42:53 PM By: Lorrin Jackson Entered By: Lorrin Jackson on 04/23/2022 12:49:45 -------------------------------------------------------------------------------- Vitals Details Patient Name: Date of Service: [name] Lax C. 04/23/2022 12:30 PM Medical Record Number: [id number] Patient Account Number: [id number] Date of Birth/Sex: Treating RN: [birth year] (61 y.o. [name] Primary Care Jamine Highfill: Jenean Lindau Other Clinician: Referring Wilder Amodei: Treating Haylen Bellotti/Extender: Elise Benne in Treatment: 7 Vital Signs Time Taken: 12:38 Temperature (F): 98.2 Height (in): 66 Pulse (bpm): 74 Weight (lbs): 339 Respiratory Rate (breaths/min): 18 Body Mass Index (BMI): 54.7 Blood Pressure (mmHg): 129/77 Capillary Blood Glucose (mg/dl): 134 Reference Range: 80 - 120 mg / dl Electronic Signature(s) Signed: 04/23/2022 2:42:53 PM By: Lorrin Jackson Entered By: Lorrin Jackson on 04/23/2022 12:40:31

## 2022-04-30 ENCOUNTER — Encounter (HOSPITAL_BASED_OUTPATIENT_CLINIC_OR_DEPARTMENT_OTHER): Payer: 59 | Admitting: Internal Medicine

## 2022-04-30 DIAGNOSIS — E11621 Type 2 diabetes mellitus with foot ulcer: Secondary | ICD-10-CM | POA: Diagnosis not present

## 2022-04-30 DIAGNOSIS — L97522 Non-pressure chronic ulcer of other part of left foot with fat layer exposed: Secondary | ICD-10-CM

## 2022-04-30 NOTE — Progress Notes (Signed)
Whitesel, Baylei C. ([id number]) Visit Report for 04/30/2022 Arrival Information Details Patient Name: Date of Service: [name] 04/30/2022 2:45 PM Medical Record Number: [id number] Patient Account Number: [id number] Date of Birth/Sex: Treating RN: December 25, 1960 (61 y.o. [name] Primary Care Aylissa Heinemann: Jenean Lindau Other Clinician: Referring Avey Mcmanamon: Treating Cauy Melody/Extender: Elise Benne in Treatment: 8 Visit Information History Since Last Visit Added or deleted any medications: No Patient Arrived: Wheel Chair Any new allergies or adverse reactions: No Arrival Time: 14:31 Had a fall or experienced change in No Accompanied By: son in law activities of daily living that may affect Transfer Assistance: Manual risk of falls: Patient Identification Verified: Yes Signs or symptoms of abuse/neglect since last visito No Secondary Verification Process Completed: Yes Hospitalized since last visit: No Patient Requires Transmission-Based Precautions: No Implantable device outside of the clinic excluding No Patient Has Alerts: No cellular tissue based products placed in the center since last visit: Has Dressing in Place as Prescribed: Yes Has Compression in Place as Prescribed: No Pain Present Now: Yes Notes Cut wrap, got it wet in shower last night Electronic Signature(s) Signed: 04/30/2022 4:18:51 PM By: Lorrin Jackson Entered By: Lorrin Jackson on 04/30/2022 14:43:31 -------------------------------------------------------------------------------- Compression Therapy Details Patient Name: Date of Service: [name] Lax C. 04/30/2022 2:45 PM Medical Record Number: [id number] Patient Account Number: [id number] Date of Birth/Sex: Treating RN: [birth year] (62 y.o. [name] Primary Care Kennidi Yoshida: Jenean Lindau Other Clinician: Referring Cosima Prentiss: Treating Lashena Signer/Extender: Elise Benne in Treatment:  8 Compression Therapy Performed for Wound Assessment: Wound #1 Left Calcaneus Performed By: Clinician Lorrin Jackson, RN Compression Type: Three Layer Post Procedure Diagnosis Same as Pre-procedure Electronic Signature(s) Signed: 04/30/2022 4:18:51 PM By: Lorrin Jackson Entered By: Lorrin Jackson on 04/30/2022 15:13:48 -------------------------------------------------------------------------------- Encounter Discharge Information Details Patient Name: Date of Service: [name] Lax C. 04/30/2022 2:45 PM Medical Record Number: [id number] Patient Account Number: [id number] Date of Birth/Sex: Treating RN: Feb 18, 1961 (61 y.o. [name] Primary Care Finnian Husted: Jenean Lindau Other Clinician: Referring Clemmie Marxen: Treating Nathanyl Andujo/Extender: Elise Benne in Treatment: 8 Encounter Discharge Information Items Discharge Condition: Stable Ambulatory Status: Wheelchair Discharge Destination: Home Transportation: Private Auto Accompanied By: son in law Schedule Follow-up Appointment: Yes Clinical Summary of Care: Provided on 04/30/2022 Form Type Recipient Paper Patient Patient Electronic Signature(s) Signed: 04/30/2022 4:18:51 PM By: Lorrin Jackson Entered By: Lorrin Jackson on 04/30/2022 15:37:46 -------------------------------------------------------------------------------- Lower Extremity Assessment Details Patient Name: Date of Service: [name] Lax C. 04/30/2022 2:45 PM Medical Record Number: [id number] Patient Account Number: [id number] Date of Birth/Sex: Treating RN: Feb 16, 1961 (61 y.o. [name] Primary Care Any Mcneice: Jenean Lindau Other Clinician: Referring Blain Hunsucker: Treating Aarini Slee/Extender: Elise Benne in Treatment: 8 Edema Assessment Assessed: Shirlyn Goltz: Yes] Patrice Paradise: No] Edema: [Left: Ye] [Right: s] Calf Left: Right: Point of Measurement: 28 cm From Medial Instep 51 cm Ankle Left:  Right: Point of Measurement: 3 cm From Medial Instep 31.8 cm Vascular Assessment Pulses: Dorsalis Pedis Palpable: [Left:Yes] Electronic Signature(s) Signed: 04/30/2022 4:18:51 PM By: Lorrin Jackson Entered By: Lorrin Jackson on 04/30/2022 14:40:12 -------------------------------------------------------------------------------- Multi Wound Chart Details Patient Name: Date of Service: [name] Lax C. 04/30/2022 2:45 PM Medical Record Number: [id number] Patient Account Number: [id number] Date of Birth/Sex: Treating RN: [birth year] (61 y.o. [name] Primary Care Makiyah Zentz: Jenean Lindau Other Clinician: Referring Hardy Harcum: Treating Ahnesti Townsend/Extender: Elise Benne in Treatment: 8 Vital Signs Height(in): 66 Capillary Blood Glucose(mg/dl): 157 Weight(lbs):  339 Pulse(bpm): 72 Body Mass Index(BMI): 54.7 Blood Pressure(mmHg): 118/68 Temperature(F): 97.8 Respiratory Rate(breaths/min): 18 Photos: [N/A:N/A] Left Calcaneus N/A N/A Wound Location: Other Lesion N/A N/A Wounding Event: Diabetic Wound/Ulcer of the Lower N/A N/A Primary Etiology: Extremity Anemia, Chronic Obstructive N/A N/A Comorbid History: Pulmonary Disease (COPD), Congestive Heart Failure, Hypertension, Myocardial Infarction, Peripheral Venous Disease, Type II Diabetes, Neuropathy 10/06/2021 N/A N/A Date Acquired: 8 N/A N/A Weeks of Treatment: Open N/A N/A Wound Status: No N/A N/A Wound Recurrence: 1.8x1.2x0.2 N/A N/A Measurements L x W x D (cm) 1.696 N/A N/A A (cm) : rea 0.339 N/A N/A Volume (cm) : 61.40% N/A N/A % Reduction in A rea: 61.50% N/A N/A % Reduction in Volume: Grade 2 N/A N/A Classification: Medium N/A N/A Exudate A mount: Serosanguineous N/A N/A Exudate Type: red, brown N/A N/A Exudate Color: Distinct, outline attached N/A N/A Wound Margin: Large (67-100%) N/A N/A Granulation A mount: Red, Pink N/A N/A Granulation Quality: Small  (1-33%) N/A N/A Necrotic A mount: Fat Layer (Subcutaneous Tissue): Yes N/A N/A Exposed Structures: Fascia: No Tendon: No Muscle: No Joint: No Bone: No Medium (34-66%) N/A N/A Epithelialization: Compression Therapy N/A N/A Procedures Performed: Treatment Notes Wound #1 (Calcaneus) Wound Laterality: Left Cleanser Soap and Water Discharge Instruction: May shower and wash wound with dial antibacterial soap and water prior to dressing change. Wound Cleanser Discharge Instruction: Cleanse the wound with wound cleanser prior to applying a clean dressing using gauze sponges, not tissue or cotton balls. Peri-Wound Care Triamcinolone 15 (g) Discharge Instruction: Use triamcinolone 15 (g) as directed Sween Lotion (Moisturizing lotion) Discharge Instruction: Apply moisturizing lotion as directed Topical Primary Dressing Hydrofera Blue Ready Foam, 2.5 x2.5 in Discharge Instruction: Apply to wound bed as instructed Secondary Dressing ABD Pad, 8x10 Discharge Instruction: Apply over primary dressing as directed. Secured With Compression Wrap ThreePress (3 layer compression wrap) Discharge Instruction: Apply three layer compression as directed. Compression Stockings Add-Ons Electronic Signature(s) Signed: 04/30/2022 4:01:46 PM By: Kalman Shan DO Signed: 04/30/2022 4:18:51 PM By: Fara Chute By: Kalman Shan on 04/30/2022 15:48:58 -------------------------------------------------------------------------------- Multi-Disciplinary Care Plan Details Patient Name: Date of Service: [name] Lax C. 04/30/2022 2:45 PM Medical Record Number: [id number] Patient Account Number: [id number] Date of Birth/Sex: Treating RN: 1960/11/30 (61 y.o. [name] Primary Care Danayah Smyre: Jenean Lindau Other Clinician: Referring Velinda Wrobel: Treating Derica Leiber/Extender: Elise Benne in Treatment: 8 Active Inactive Abuse / Safety / Falls / Self Care  Management Nursing Diagnoses: History of Falls Goals: Patient will not experience any injury related to falls Date Initiated: 03/05/2022 Target Resolution Date: 05/28/2022 Goal Status: Active Interventions: Assess fall risk on admission and as needed Assess: immobility, friction, shearing, incontinence upon admission and as needed Assess impairment of mobility on admission and as needed per policy Notes: 2/56/38: Fall prevention ongoing, recent fall. Wound/Skin Impairment Nursing Diagnoses: Impaired tissue integrity Goals: Patient/caregiver will verbalize understanding of skin care regimen Date Initiated: 03/05/2022 Target Resolution Date: 05/28/2022 Goal Status: Active Ulcer/skin breakdown will have a volume reduction of 30% by week 4 Date Initiated: 03/05/2022 Date Inactivated: 04/30/2022 Target Resolution Date: 05/02/2022 Goal Status: Met Ulcer/skin breakdown will have a volume reduction of 50% by week 8 Date Initiated: 04/30/2022 Target Resolution Date: 05/28/2022 Goal Status: Active Interventions: Assess patient/caregiver ability to obtain necessary supplies Assess patient/caregiver ability to perform ulcer/skin care regimen upon admission and as needed Assess ulceration(s) every visit Provide education on smoking Provide education on ulcer and skin care Treatment Activities: Topical wound management initiated : 03/05/2022  Notes: 04/30/22: Wound care regimen continues. Electronic Signature(s) Signed: 04/30/2022 4:18:51 PM By: Lorrin Jackson Entered By: Lorrin Jackson on 04/30/2022 14:44:07 -------------------------------------------------------------------------------- Pain Assessment Details Patient Name: Date of Service: [name] Lax C. 04/30/2022 2:45 PM Medical Record Number: [id number] Patient Account Number: [id number] Date of Birth/Sex: Treating RN: 1961-09-24 (61 y.o. [name] Primary Care Jaun Galluzzo: Jenean Lindau Other Clinician: Referring  Melah Ebling: Treating Avika Carbine/Extender: Elise Benne in Treatment: 8 Active Problems Location of Pain Severity and Description of Pain Patient Has Paino Yes Site Locations Pain Location: Pain in Ulcers With Dressing Change: Yes Duration of the Pain. Constant / Intermittento Intermittent Rate the pain. Current Pain Level: 5 Character of Pain Describe the Pain: Tender, Throbbing Pain Management and Medication Current Pain Management: Medication: Yes Cold Application: No Rest: Yes Massage: No Activity: No T.E.N.S.: No Heat Application: No Leg drop or elevation: No Is the Current Pain Management Adequate: Adequate How does your wound impact your activities of daily livingo Sleep: No Bathing: No Appetite: No Relationship With Others: No Bladder Continence: No Emotions: No Bowel Continence: No Work: No Toileting: No Drive: No Dressing: No Hobbies: No Electronic Signature(s) Signed: 04/30/2022 4:18:51 PM By: Lorrin Jackson Entered By: Lorrin Jackson on 04/30/2022 14:35:14 -------------------------------------------------------------------------------- Patient/Caregiver Education Details Patient Name: Date of Service: [name] 7/27/2023andnbsp2:45 PM Medical Record Number: [id number] Patient Account Number: [id number] Date of Birth/Gender: Treating RN: 17-Jul-1961 (61 y.o. [name] Primary Care Physician: Jenean Lindau Other Clinician: Referring Physician: Treating Physician/Extender: Elise Benne in Treatment: 8 Education Assessment Education Provided To: Patient Education Topics Provided Smoking and Wound Healing: Methods: Explain/Verbal, Printed Responses: State content correctly Venous: Methods: Explain/Verbal, Printed Responses: State content correctly Wound/Skin Impairment: Methods: Explain/Verbal, Printed Responses: State content correctly Electronic Signature(s) Signed:  04/30/2022 4:18:51 PM By: Lorrin Jackson Entered By: Lorrin Jackson on 04/30/2022 14:44:37 -------------------------------------------------------------------------------- Wound Assessment Details Patient Name: Date of Service: [name] Lax C. 04/30/2022 2:45 PM Medical Record Number: [id number] Patient Account Number: [id number] Date of Birth/Sex: Treating RN: 23-Feb-1961 (61 y.o. [name] Primary Care Jemia Fata: Jenean Lindau Other Clinician: Referring Eugean Arnott: Treating Jazzelle Zhang/Extender: Elise Benne in Treatment: 8 Wound Status Wound Number: 1 Primary Diabetic Wound/Ulcer of the Lower Extremity Etiology: Wound Location: Left Calcaneus Wound Open Wounding Event: Other Lesion Status: Date Acquired: 10/06/2021 Comorbid Anemia, Chronic Obstructive Pulmonary Disease (COPD), Weeks Of Treatment: 8 History: Congestive Heart Failure, Hypertension, Myocardial Infarction, Clustered Wound: No Peripheral Venous Disease, Type II Diabetes, Neuropathy Photos Wound Measurements Length: (cm) 1.8 Width: (cm) 1.2 Depth: (cm) 0.2 Area: (cm) 1.696 Volume: (cm) 0.339 % Reduction in Area: 61.4% % Reduction in Volume: 61.5% Epithelialization: Medium (34-66%) Tunneling: No Undermining: No Wound Description Classification: Grade 2 Wound Margin: Distinct, outline attached Exudate Amount: Medium Exudate Type: Serosanguineous Exudate Color: red, brown Foul Odor After Cleansing: No Slough/Fibrino Yes Wound Bed Granulation Amount: Large (67-100%) Exposed Structure Granulation Quality: Red, Pink Fascia Exposed: No Necrotic Amount: Small (1-33%) Fat Layer (Subcutaneous Tissue) Exposed: Yes Necrotic Quality: Adherent Slough Tendon Exposed: No Muscle Exposed: No Joint Exposed: No Bone Exposed: No Treatment Notes Wound #1 (Calcaneus) Wound Laterality: Left Cleanser Soap and Water Discharge Instruction: May shower and wash wound with dial  antibacterial soap and water prior to dressing change. Wound Cleanser Discharge Instruction: Cleanse the wound with wound cleanser prior to applying a clean dressing using gauze sponges, not tissue or cotton balls. Peri-Wound Care Triamcinolone 15 (g) Discharge Instruction: Use triamcinolone 15 (  g) as directed Sween Lotion (Moisturizing lotion) Discharge Instruction: Apply moisturizing lotion as directed Topical Primary Dressing Hydrofera Blue Ready Foam, 2.5 x2.5 in Discharge Instruction: Apply to wound bed as instructed Secondary Dressing ABD Pad, 8x10 Discharge Instruction: Apply over primary dressing as directed. Secured With Compression Wrap ThreePress (3 layer compression wrap) Discharge Instruction: Apply three layer compression as directed. Compression Stockings Add-Ons Electronic Signature(s) Signed: 04/30/2022 4:18:51 PM By: Lorrin Jackson Entered By: Lorrin Jackson on 04/30/2022 14:45:53 -------------------------------------------------------------------------------- Vitals Details Patient Name: Date of Service: [name] Lax C. 04/30/2022 2:45 PM Medical Record Number: [id number] Patient Account Number: [id number] Date of Birth/Sex: Treating RN: [birth year] (61 y.o. [name] Primary Care Radin Raptis: Jenean Lindau Other Clinician: Referring Antoinetta Berrones: Treating Farrell Broerman/Extender: Elise Benne in Treatment: 8 Vital Signs Time Taken: 14:32 Temperature (F): 97.8 Height (in): 66 Pulse (bpm): 72 Weight (lbs): 339 Respiratory Rate (breaths/min): 18 Body Mass Index (BMI): 54.7 Blood Pressure (mmHg): 118/68 Capillary Blood Glucose (mg/dl): 157 Reference Range: 80 - 120 mg / dl Electronic Signature(s) Signed: 04/30/2022 4:18:51 PM By: Lorrin Jackson Entered By: Lorrin Jackson on 04/30/2022 14:33:34

## 2022-05-01 NOTE — Progress Notes (Signed)
Barberi, Satoya C. ([id number]) Visit Report for 04/30/2022 Chief Complaint Document Details Patient Name: Date of Service: [name] 04/30/2022 2:45 PM Medical Record Number: [id number] Patient Account Number: [id number] Date of Birth/Sex: Treating RN: [birth year] (61 y.o. [name] Primary Care Provider: Jenean [name] Other Clinician: Referring Provider: Treating Provider/Extender: [name] in Treatment: 8 Information Obtained from: Patient Chief Complaint 03/05/2022; left foot wound Electronic Signature(s) Signed: 04/30/2022 4:01:46 PM By: [name] Shan DO Entered By: [name] on 04/30/2022 15:49:03 -------------------------------------------------------------------------------- HPI Details Patient Name: Date of Service: [name] Lax C. 04/30/2022 2:45 PM Medical Record Number: [id number] Patient Account Number: [id number] Date of Birth/Sex: Treating RN: 09/15/1961 (61 y.o. [name] Primary Care Provider: Jenean [name] Other Clinician: Referring Provider: Treating Provider/Extender: [name] in Treatment: 8 History of Present Illness HPI Description: Admission 03/05/2022 [name] is a 61 year old female with a past medical history of uncontrolled insulin-dependent type 2 diabetes, tobacco user and chronic diastolic heart failure that presents to the clinic for a 9-month history of wound to her left heel. She states she had a left ankle fusion in September 2022. She states that she always had a wound after the surgery and it never healed. She has home health and they have been doing compression wraps along with silver alginate to the wound bed. She currently denies signs of infection. 6/8; this is a 61 year old woman with type 2 diabetes. She developed a wound on her left Achilles heel just above the tip of the heel in the setting of recurrent ankle surgeries in late 2022. I  have seen some of these results from either the cast or the surgical boots that are put on after these operations. She thinks this may be the case. And a sense of pressure ulcer. In any case that she has been using Santyl Hydrofera Blue under compression. She is not wearing any footwear at home as she cannot find anything to accommodate the wrap 6/22; patient presents for follow-up. She has home health that comes out once a week to help with dressing changes. She has no issues or complaints today. We have been using Hydrofera Blue under compression therapy. 7/6; patient presents for follow-up. She states that home health did not come out for the past 2 weeks. It is unclear why. We have been using Hydrofera Blue and Santyl under compression therapy. 7/13; patient presents for follow-up. She did not take the oral antibiotics prescribed at last clinic visit. We have been using Hydrofera Blue with gentamicin/mupirocin ointment under compression therapy. She has no issues or complaints today. She denies signs of infection. 7/20; patient presents for follow-up. We have been using Hydrofera Blue with antibiotic ointment under 3 layer compression. She has home health that change the dressing once. They put collagen on the wound bed. She also reports falling yesterday and hitting her right foot. She has no pain to the area today. 7/27; patient presents for follow-up. We have been using Hydrofera Blue under 3 layer compression. She has home health that changes the dressing. She has no issues or complaints today. Electronic Signature(s) Signed: 04/30/2022 4:01:46 PM By: [name] Shan DO Signed: 04/30/2022 4:01:46 PM By: [name] Shan DO Entered By: [name] on 04/30/2022 15:49:49 -------------------------------------------------------------------------------- Physical Exam Details Patient Name: Date of Service: [name] Lax C. 04/30/2022 2:45 PM Medical Record Number: [id number] Patient  Account Number: [id number] Date of Birth/Sex: Treating RN: 03-10-1961 (61 y.o. [name]  Primary Care Provider: Jenean [name] Other Clinician: Referring Provider: Treating Provider/Extender: [name] in Treatment: 8 Constitutional respirations regular, non-labored and within target range for patient.. Cardiovascular 2+ dorsalis pedis/posterior tibialis pulses. Psychiatric pleasant and cooperative. Notes Left foot: T the posterior ankle there is an open wound with granulation tissue throughout. No surrounding signs of infection. o Electronic Signature(s) Signed: 04/30/2022 4:01:46 PM By: [name] Shan DO Entered By: [name] on 04/30/2022 15:50:20 -------------------------------------------------------------------------------- Physician Orders Details Patient Name: Date of Service: [name] Lax C. 04/30/2022 2:45 PM Medical Record Number: [id number] Patient Account Number: [id number] Date of Birth/Sex: Treating RN: 18-Jun-1961 (61 y.o. [name] Primary Care Provider: Jenean [name] Other Clinician: Referring Provider: Treating Provider/Extender: [name] in Treatment: 8 Verbal / Phone Orders: No Diagnosis Coding ICD-10 Coding Code Description [phone] Non-pressure chronic ulcer of other part of left foot with fat layer exposed E11.621 Type 2 diabetes mellitus with foot ulcer E66.01 Morbid (severe) obesity due to excess calories Follow-up Appointments ppointment in 1 week. - Thursday 05/07/22 @ 8:45am Dr. Heber  and Leveda Anna (Room # 7) Return A Cellular or Tissue Based Products Cellular or Tissue Based Product Type: - Run IVR for EpiFix and Epicord Bathing/ Shower/ Hygiene May shower with protection but do not get wound dressing(s) wet. - May use cast protector bag from [facility], [facility] or [employer] Edema Control - Lymphedema / SCD / Other Elevate legs to the level of the heart or above  for 30 minutes daily and/or when sitting, a frequency of: Avoid standing for long periods of time. Additional Orders / Instructions Follow Nutritious Diet - Monitor/Control Blood Sugar Home Health No change in wound care orders this week; continue Home Health for wound care. May utilize formulary equivalent dressing for wound treatment orders unless otherwise specified. - Skilled nursing for wound care 1x and wrap changed in office 1x week. every other week when not coming into wound center home health to change twice a week. Other Home Health Orders/Instructions: - [facility] Wound Treatment Wound #1 - Calcaneus Wound Laterality: Left Cleanser: Soap and Water (Home Health) 2 x Per Week/30 Days Discharge Instructions: May shower and wash wound with dial antibacterial soap and water prior to dressing change. Cleanser: Wound Cleanser 2 x Per Week/30 Days Discharge Instructions: Cleanse the wound with wound cleanser prior to applying a clean dressing using gauze sponges, not tissue or cotton balls. Peri-Wound Care: Triamcinolone 15 (g) 2 x Per Week/30 Days Discharge Instructions: Use triamcinolone 15 (g) as directed Peri-Wound Care: Sween Lotion (Moisturizing lotion) (Home Health) 2 x Per Week/30 Days Discharge Instructions: Apply moisturizing lotion as directed Prim Dressing: Hydrofera Blue Ready Foam, 2.5 x2.5 in (Home Health) 2 x Per Week/30 Days ary Discharge Instructions: Apply to wound bed as instructed Secondary Dressing: ABD Pad, 8x10 (Home Health) 2 x Per Week/30 Days Discharge Instructions: Apply over primary dressing as directed. Compression Wrap: ThreePress (3 layer compression wrap) (Home Health) 2 x Per Week/30 Days Discharge Instructions: Apply three layer compression as directed. Electronic Signature(s) Signed: 04/30/2022 4:18:51 PM By: Lorrin Jackson Signed: 05/01/2022 10:12:22 AM By: [name] Shan DO Previous Signature: 04/30/2022 4:01:46 PM Version By: [name] Shan  DO Entered By: Lorrin Jackson on 04/30/2022 16:16:18 -------------------------------------------------------------------------------- Problem List Details Patient Name: Date of Service: [name] Lax C. 04/30/2022 2:45 PM Medical Record Number: [id number] Patient Account Number: [id number] Date of Birth/Sex: Treating RN: Jan 31, 1961 (61 y.o. [name] Primary Care Provider: Jenean [name] Other Clinician: Referring  Provider: Treating Provider/Extender: [name] in Treatment: 8 Active Problems ICD-10 Encounter Code Description Active Date MDM Diagnosis L97.522 Non-pressure chronic ulcer of other part of left foot with fat layer exposed 03/05/2022 No Yes E11.621 Type 2 diabetes mellitus with foot ulcer 03/05/2022 No Yes E66.01 Morbid (severe) obesity due to excess calories 03/05/2022 No Yes Inactive Problems Resolved Problems Electronic Signature(s) Signed: 04/30/2022 4:01:46 PM By: Geralyn Corwin DO Entered By: Geralyn Corwin on 04/30/2022 15:48:53 -------------------------------------------------------------------------------- Progress Note Details Patient Name: Date of Service: [name] Bushy C. 04/30/2022 2:45 PM Medical Record Number: [id number] Patient Account Number: [id number] Date of Birth/Sex: Treating RN: 1961-02-14 (61 y.o. [name] Primary Care Provider: [name] Other Clinician: Referring Provider: Treating Provider/Extender: [name] in Treatment: 8 Subjective Chief Complaint Information obtained from Patient 03/05/2022; left foot wound History of Present Illness (HPI) Admission 03/05/2022 [name] is a 61 year old female with a past medical history of uncontrolled insulin-dependent type 2 diabetes, tobacco user and chronic diastolic heart failure that presents to the clinic for a 73-month history of wound to her left heel. She states she had a left ankle fusion in  September 2022. She states that she always had a wound after the surgery and it never healed. She has home health and they have been doing compression wraps along with silver alginate to the wound bed. She currently denies signs of infection. 6/8; this is a 60 year old woman with type 2 diabetes. She developed a wound on her left Achilles heel just above the tip of the heel in the setting of recurrent ankle surgeries in late 2022. I have seen some of these results from either the cast or the surgical boots that are put on after these operations. She thinks this may be the case. And a sense of pressure ulcer. In any case that she has been using Santyl Hydrofera Blue under compression. She is not wearing any footwear at home as she cannot find anything to accommodate the wrap 6/22; patient presents for follow-up. She has home health that comes out once a week to help with dressing changes. She has no issues or complaints today. We have been using Hydrofera Blue under compression therapy. 7/6; patient presents for follow-up. She states that home health did not come out for the past 2 weeks. It is unclear why. We have been using Hydrofera Blue and Santyl under compression therapy. 7/13; patient presents for follow-up. She did not take the oral antibiotics prescribed at last clinic visit. We have been using Hydrofera Blue with gentamicin/mupirocin ointment under compression therapy. She has no issues or complaints today. She denies signs of infection. 7/20; patient presents for follow-up. We have been using Hydrofera Blue with antibiotic ointment under 3 layer compression. She has home health that change the dressing once. They put collagen on the wound bed. She also reports falling yesterday and hitting her right foot. She has no pain to the area today. 7/27; patient presents for follow-up. We have been using Hydrofera Blue under 3 layer compression. She has home health that changes the dressing. She  has no issues or complaints today. Patient History Information obtained from Patient, Chart. Family History Cancer - Father,Mother, Diabetes - Mother,Maternal Grandparents,Paternal Grandparents, Heart Disease - Siblings,Maternal Grandparents, Hypertension - Siblings, Tuberculosis - Maternal Grandparents,Paternal Grandparents. Social History Current every day smoker, Marital Status - Single, Alcohol Use - Never, Drug Use - No History, Caffeine Use - Daily. 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 Vitals Time Taken: 2:32 PM, Height: 66 in, Weight: 339 lbs, BMI: 54.7, Temperature: 97.8 F, Pulse: 72 bpm, Respiratory Rate: 18 breaths/min, Blood Pressure: 118/68 mmHg, Capillary Blood Glucose: 157 mg/dl. Cardiovascular 2+ dorsalis pedis/posterior tibialis pulses. Psychiatric pleasant and cooperative. General Notes: Left foot: T the posterior ankle there is an open wound with granulation tissue throughout. No surrounding signs of infection. o Integumentary (Hair, Skin) Wound #1 status is Open. Original cause of wound was Other Lesion. The date acquired was: 10/06/2021. The wound has been in treatment 8 weeks. The wound is located on the Left Calcaneus. The wound measures 1.8cm length x 1.2cm width x 0.2cm depth; 1.696cm^2 area and 0.339cm^3 volume. There is Fat  Layer (Subcutaneous Tissue) exposed. There is no tunneling or undermining noted. There is a medium amount of serosanguineous drainage noted. The wound margin is distinct with the outline attached to the wound base. There is large (67-100%) red, pink granulation within the wound bed. There is a small (1-33%) amount of necrotic tissue within the wound bed including Adherent Slough. Assessment Active Problems ICD-10 Non-pressure chronic ulcer of other part of left foot with fat layer exposed Type 2 diabetes mellitus with foot ulcer Morbid (severe) obesity due to excess calories Patient's wound has shown improvement in size and appearance since last clinic visit. No need for debridement today. I recommended continuing Hydrofera Blue under compression therapy. She would benefit from a skin substitute. We will run EpiFix and Grafix. Follow-up in 1 week. Procedures Wound #1 Pre-procedure diagnosis of Wound #1 is a Diabetic Wound/Ulcer of the Lower Extremity located on the Left Calcaneus . There was a Three Layer Compression Therapy Procedure by Lorrin Jackson, RN. Post procedure Diagnosis Wound #1: Same as Pre-Procedure Plan Follow-up Appointments: Return Appointment in 1 week. - Thursday 05/07/22 @ 8:45am Dr. Heber  and Leveda Anna (Room # 7) Cellular or Tissue Based Products: Cellular or Tissue Based Product Type: - Run IVR for EpiFix and Grafix Bathing/ Shower/ Hygiene: May shower with protection but do not get wound dressing(s) wet. - May use cast protector bag from [facility], [facility] or [employer] Edema Control - Lymphedema / SCD / Other: Elevate legs to the level of the heart or above for 30 minutes daily and/or when sitting, a frequency of: Avoid standing for long periods of time. Additional Orders / Instructions: Follow Nutritious Diet - Monitor/Control Blood Sugar Home Health: No change in wound care orders this week; continue Home Health for wound care. May utilize formulary equivalent dressing for  wound treatment orders unless otherwise specified. - Skilled nursing for wound care 1x and wrap changed in office 1x week. every other week when not coming into wound center home health to change twice a week. Other Home Health Orders/Instructions: - [facility] WOUND #1: - Calcaneus Wound Laterality: Left Cleanser: Soap and Water (Home Health) 2 x Per Week/30 Days Discharge Instructions: May shower and wash wound with dial antibacterial soap and water prior to dressing change. Cleanser: Wound Cleanser 2 x Per Week/30 Days Discharge Instructions: Cleanse the wound  with wound cleanser prior to applying a clean dressing using gauze sponges, not tissue or cotton balls. Peri-Wound Care: Triamcinolone 15 (g) 2 x Per Week/30 Days Discharge Instructions: Use triamcinolone 15 (g) as directed Peri-Wound Care: Sween Lotion (Moisturizing lotion) (Home Health) 2 x Per Week/30 Days Discharge Instructions: Apply moisturizing lotion as directed Prim Dressing: Hydrofera Blue Ready Foam, 2.5 x2.5 in (Home Health) 2 x Per Week/30 Days ary Discharge Instructions: Apply to wound bed as instructed Secondary Dressing: ABD Pad, 8x10 (Home Health) 2 x Per Week/30 Days Discharge Instructions: Apply over primary dressing as directed. Com pression Wrap: ThreePress (3 layer compression wrap) (Home Health) 2 x Per Week/30 Days Discharge Instructions: Apply three layer compression as directed. 1. Hydrofera Blue under 3 layer compression 2. Run IVR for EpiFix and Grafix 3. Follow-up in 1 week Electronic Signature(s) Signed: 04/30/2022 4:01:46 PM By: Geralyn Corwin DO Entered By: Geralyn Corwin on 04/30/2022 15:51:49 -------------------------------------------------------------------------------- HxROS Details Patient Name: Date of Service: [name] Bushy C. 04/30/2022 2:45 PM Medical Record Number: [id number] Patient Account Number: [id number] Date of Birth/Sex: Treating RN: May 03, 1961 (61 y.o. [name] Primary Care Provider: [name] Other Clinician: Referring Provider: Treating Provider/Extender: [name] in Treatment: 8 Information Obtained From Patient Chart Hematologic/Lymphatic Medical History: Positive for: Anemia Respiratory Medical History: Positive for: Chronic Obstructive Pulmonary Disease (COPD) Cardiovascular Medical History: Positive for: Congestive Heart Failure - Weighs self daily; Hypertension; Myocardial Infarction; Peripheral Venous Disease Gastrointestinal Medical History: Past Medical History Notes: GERD, Peptic Ulcer Disease Endocrine Medical History: Positive for: Type II Diabetes Time with diabetes: 16 years Treated with: Insulin, Oral agents Blood sugar tested every day: Yes Tested : Twice daily Musculoskeletal Medical History: Past Medical History Notes: Broke left leg 3 years ago, 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, foot fused Neurologic Medical History: Positive for: Neuropathy Psychiatric Medical History: Past Medical History Notes: Depression Immunizations Pneumococcal Vaccine: Received Pneumococcal Vaccination: No Implantable Devices None Family and Social History Cancer: Yes - Father,Mother; Diabetes: Yes - Mother,Maternal Grandparents,Paternal Grandparents; Heart Disease: Yes - Siblings,Maternal Grandparents; Hypertension: Yes - Siblings; Tuberculosis: Yes - Maternal Grandparents,Paternal Grandparents; Current every day smoker; Marital Status - Single; Alcohol Use: Never; Drug Use: No History; Caffeine Use: Daily; Financial Concerns: No; Food, Clothing or Shelter Needs: No; Support System Lacking: No; Transportation Concerns: No Electronic Signature(s) Signed: 04/30/2022 4:01:46 PM By: Geralyn Corwin DO Signed: 04/30/2022 4:18:51 PM By: Antonieta Iba Entered By: Geralyn Corwin on 04/30/2022 15:49:54 -------------------------------------------------------------------------------- SuperBill  Details Patient Name: Date of Service: [name] Bushy C. 04/30/2022 Medical Record Number: [id number] Patient Account Number: [id number] Date of Birth/Sex: Treating RN: 1961/08/01 (61 y.o. [name] Primary Care Provider: [name] Other Clinician: Referring Provider: Treating Provider/Extender: [name] in Treatment: 8 Diagnosis Coding ICD-10 Codes Code Description [phone] Non-pressure chronic ulcer of other part of left foot with fat layer exposed E11.621 Type 2 diabetes mellitus with foot ulcer E66.01 Morbid (severe) obesity due to excess calories Facility Procedures CPT4 Code: [id number] Description: (Facility Use Only) [phone] - APPLY MULTLAY COMPRS LWR LT LEG ICD-10 Diagnosis Description L97.522 Non-pressure chronic ulcer of other part of left foot with fat layer exposed Modifier: Quantity: 1 Physician Procedures : CPT4 Code Description Modifier [id number] - WC PHYS LEVEL 3 - EST PT ICD-10 Diagnosis Description L97.522 Non-pressure chronic ulcer of other part of left foot with fat layer exposed E11.621 Type 2 diabetes mellitus with foot ulcer E66.01 Morbid  (severe) obesity due to excess calories Quantity:  1 Electronic Signature(s) Signed: 04/30/2022 4:01:46 PM By: Geralyn Corwin DO Entered By: Geralyn Corwin on 04/30/2022 15:51:59

## 2022-05-07 ENCOUNTER — Encounter (HOSPITAL_BASED_OUTPATIENT_CLINIC_OR_DEPARTMENT_OTHER): Payer: 59 | Attending: Internal Medicine | Admitting: Internal Medicine

## 2022-05-07 DIAGNOSIS — F17200 Nicotine dependence, unspecified, uncomplicated: Secondary | ICD-10-CM | POA: Diagnosis not present

## 2022-05-07 DIAGNOSIS — I110 Hypertensive heart disease with heart failure: Secondary | ICD-10-CM | POA: Diagnosis not present

## 2022-05-07 DIAGNOSIS — I5032 Chronic diastolic (congestive) heart failure: Secondary | ICD-10-CM | POA: Diagnosis not present

## 2022-05-07 DIAGNOSIS — Z794 Long term (current) use of insulin: Secondary | ICD-10-CM | POA: Diagnosis not present

## 2022-05-07 DIAGNOSIS — L97522 Non-pressure chronic ulcer of other part of left foot with fat layer exposed: Secondary | ICD-10-CM | POA: Diagnosis present

## 2022-05-07 DIAGNOSIS — Z6841 Body Mass Index (BMI) 40.0 and over, adult: Secondary | ICD-10-CM | POA: Diagnosis not present

## 2022-05-07 DIAGNOSIS — E11621 Type 2 diabetes mellitus with foot ulcer: Secondary | ICD-10-CM | POA: Diagnosis not present

## 2022-05-07 DIAGNOSIS — E1165 Type 2 diabetes mellitus with hyperglycemia: Secondary | ICD-10-CM | POA: Diagnosis not present

## 2022-05-07 NOTE — Progress Notes (Signed)
[name], [name]. ([id number]) Visit Report for 05/07/2022 Arrival Information Details Patient Name: Date of Service: [name] 05/07/2022 8:45 A M Medical Record Number: [id number] Patient Account Number: [id number] Date of Birth/Sex: Treating RN: 05-12-1961 (61 y.o. [name] Primary Care Cadee Agro: Jenean Lindau Other Clinician: Referring Tighe Gitto: Treating Emanuela Runnion/Extender: Elise Benne in Treatment: 9 Visit Information History Since Last Visit Added or deleted any medications: No Patient Arrived: Wheel Chair Any new allergies or adverse reactions: No Arrival Time: 08:51 Had a fall or experienced change in No Accompanied By: daughter activities of daily living that may affect Transfer Assistance: None risk of falls: Patient Identification Verified: Yes Signs or symptoms of abuse/neglect since last visito No Secondary Verification Process Completed: Yes Hospitalized since last visit: No Patient Requires Transmission-Based Precautions: No Implantable device outside of the clinic excluding No Patient Has Alerts: No cellular tissue based products placed in the center since last visit: Has Dressing in Place as Prescribed: Yes Has Compression in Place as Prescribed: Yes Pain Present Now: Yes Electronic Signature(s) Signed: 05/07/2022 4:49:08 PM By: Erenest Blank Entered By: Erenest Blank on 05/07/2022 08:51:48 -------------------------------------------------------------------------------- Compression Therapy Details Patient Name: Date of Service: [name] Lax C. 05/07/2022 8:45 A M Medical Record Number: [id number] Patient Account Number: [id number] Date of Birth/Sex: Treating RN: 1960-12-05 (61 y.o. [name] Primary Care Maudry Zeidan: Jenean Lindau Other Clinician: Referring Rhylynn Perdomo: Treating Keeli Roberg/Extender: Elise Benne in Treatment: 9 Compression Therapy Performed for Wound Assessment:  Wound #1 Left Calcaneus Performed By: Clinician Lorrin Jackson, RN Compression Type: Three Layer Post Procedure Diagnosis Same as Pre-procedure Electronic Signature(s) Signed: 05/07/2022 4:29:20 PM By: Lorrin Jackson Entered By: Lorrin Jackson on 05/07/2022 09:21:07 -------------------------------------------------------------------------------- Encounter Discharge Information Details Patient Name: Date of Service: [name] Lax C. 05/07/2022 8:45 A M Medical Record Number: [id number] Patient Account Number: [id number] Date of Birth/Sex: Treating RN: 1961/08/10 (61 y.o. [name] Primary Care Calloway Andrus: Jenean Lindau Other Clinician: Referring Hajer Dwyer: Treating Hrishikesh Hoeg/Extender: Elise Benne in Treatment: 9 Encounter Discharge Information Items Discharge Condition: Stable Ambulatory Status: Wheelchair Discharge Destination: Home Transportation: Private Auto Accompanied By: Daughter Schedule Follow-up Appointment: Yes Clinical Summary of Care: Provided on 05/07/2022 Form Type Recipient Paper Patient Patient Electronic Signature(s) Signed: 05/07/2022 9:42:45 AM By: Lorrin Jackson Entered By: Lorrin Jackson on 05/07/2022 09:42:45 -------------------------------------------------------------------------------- Lower Extremity Assessment Details Patient Name: Date of Service: [name] Lax C. 05/07/2022 8:45 A M Medical Record Number: [id number] Patient Account Number: [id number] Date of Birth/Sex: Treating RN: 06-22-1961 (61 y.o. [name] Primary Care Joh Rao: Jenean Lindau Other Clinician: Referring Nafisah Runions: Treating Khila Papp/Extender: Elise Benne in Treatment: 9 Edema Assessment Assessed: Shirlyn Goltz: No] Patrice Paradise: No] Edema: [Left: Ye] [Right: s] Calf Left: Right: Point of Measurement: 28 cm From Medial Instep 50.4 cm Ankle Left: Right: Point of Measurement: 3 cm From Medial Instep 33  cm Electronic Signature(s) Signed: 05/07/2022 4:29:20 PM By: Lorrin Jackson Signed: 05/07/2022 4:49:08 PM By: Erenest Blank Entered By: Erenest Blank on 05/07/2022 09:07:55 -------------------------------------------------------------------------------- Multi Wound Chart Details Patient Name: Date of Service: [name] Lax C. 05/07/2022 8:45 A M Medical Record Number: [id number] Patient Account Number: [id number] Date of Birth/Sex: Treating RN: [birth year] (61 y.o. [name] Primary Care Amai Cappiello: Jenean Lindau Other Clinician: Referring Ayyub Krall: Treating Marvelous Bouwens/Extender: Elise Benne in Treatment: 9 Vital Signs Height(in): 66 Capillary Blood Glucose(mg/dl): 256 Weight(lbs): 339 Pulse(bpm): 83 Body Mass Index(BMI): 54.7 Blood Pressure(mmHg):  141/76 Temperature(F): Respiratory Rate(breaths/min): 18 Photos: [N/A:N/A] Left Calcaneus N/A N/A Wound Location: Other Lesion N/A N/A Wounding Event: Diabetic Wound/Ulcer of the Lower N/A N/A Primary Etiology: Extremity Anemia, Chronic Obstructive N/A N/A Comorbid History: Pulmonary Disease (COPD), Congestive Heart Failure, Hypertension, Myocardial Infarction, Peripheral Venous Disease, Type II Diabetes, Neuropathy 10/06/2021 N/A N/A Date Acquired: 9 N/A N/A Weeks of Treatment: Open N/A N/A Wound Status: No N/A N/A Wound Recurrence: 1.8x1.1x0.2 N/A N/A Measurements L x W x D (cm) 1.555 N/A N/A A (cm) : rea 0.311 N/A N/A Volume (cm) : 64.60% N/A N/A % Reduction in A rea: 64.70% N/A N/A % Reduction in Volume: Grade 2 N/A N/A Classification: Medium N/A N/A Exudate A mount: Serosanguineous N/A N/A Exudate Type: red, brown N/A N/A Exudate Color: Distinct, outline attached N/A N/A Wound Margin: Large (67-100%) N/A N/A Granulation A mount: Red, Pink N/A N/A Granulation Quality: Small (1-33%) N/A N/A Necrotic A mount: Fat Layer (Subcutaneous Tissue): Yes N/A  N/A Exposed Structures: Fascia: No Tendon: No Muscle: No Joint: No Bone: No Medium (34-66%) N/A N/A Epithelialization: Compression Therapy N/A N/A Procedures Performed: Treatment Notes Electronic Signature(s) Signed: 05/07/2022 12:24:18 PM By: Kalman Shan DO Signed: 05/07/2022 4:29:20 PM By: Lorrin Jackson Entered By: Kalman Shan on 05/07/2022 09:23:30 -------------------------------------------------------------------------------- Multi-Disciplinary Care Plan Details Patient Name: Date of Service: [name] Lax C. 05/07/2022 8:45 A M Medical Record Number: [id number] Patient Account Number: [id number] Date of Birth/Sex: Treating RN: [birth year] (61 y.o. [name] Primary Care Tarshia Kot: Jenean Lindau Other Clinician: Referring Rhyder Koegel: Treating Baran Kuhrt/Extender: Elise Benne in Treatment: 9 Active Inactive Abuse / Safety / Falls / Self Care Management Nursing Diagnoses: History of Falls Goals: Patient will not experience any injury related to falls Date Initiated: 03/05/2022 Target Resolution Date: 05/28/2022 Goal Status: Active Interventions: Assess fall risk on admission and as needed Assess: immobility, friction, shearing, incontinence upon admission and as needed Assess impairment of mobility on admission and as needed per policy Notes: 10/21/55: Fall prevention ongoing, recent fall. Wound/Skin Impairment Nursing Diagnoses: Impaired tissue integrity Goals: Patient/caregiver will verbalize understanding of skin care regimen Date Initiated: 03/05/2022 Target Resolution Date: 05/28/2022 Goal Status: Active Ulcer/skin breakdown will have a volume reduction of 30% by week 4 Date Initiated: 03/05/2022 Date Inactivated: 04/30/2022 Target Resolution Date: 05/02/2022 Goal Status: Met Ulcer/skin breakdown will have a volume reduction of 50% by week 8 Date Initiated: 04/30/2022 Target Resolution Date: 05/28/2022 Goal Status:  Active Interventions: Assess patient/caregiver ability to obtain necessary supplies Assess patient/caregiver ability to perform ulcer/skin care regimen upon admission and as needed Assess ulceration(s) every visit Provide education on smoking Provide education on ulcer and skin care Treatment Activities: Topical wound management initiated : 03/05/2022 Notes: 04/30/22: Wound care regimen continues. Electronic Signature(s) Signed: 05/07/2022 4:29:20 PM By: Lorrin Jackson Entered By: Lorrin Jackson on 05/07/2022 09:09:53 -------------------------------------------------------------------------------- Pain Assessment Details Patient Name: Date of Service: [name] Lax C. 05/07/2022 8:45 A M Medical Record Number: [id number] Patient Account Number: [id number] Date of Birth/Sex: Treating RN: Jul 31, 1961 (61 y.o. [name] Primary Care Shlome Baldree: Jenean Lindau Other Clinician: Referring Afia Messenger: Treating Melo Stauber/Extender: Elise Benne in Treatment: 9 Active Problems Location of Pain Severity and Description of Pain Patient Has Paino Yes Site Locations Pain Location: Pain in Ulcers Rate the pain. Current Pain Level: 6 Pain Management and Medication Current Pain Management: Electronic Signature(s) Signed: 05/07/2022 4:29:20 PM By: Lorrin Jackson Signed: 05/07/2022 4:49:08 PM By: Erenest Blank Entered By: Erenest Blank on 05/07/2022  08:52:01 -------------------------------------------------------------------------------- Patient/Caregiver Education Details Patient Name: Date of Service: [name]. 8/3/2023andnbsp8:45 A M Medical Record Number: [id number] Patient Account Number: [id number] Date of Birth/Gender: Treating RN: 1961/06/16 (61 y.o. [name] Primary Care Physician: Jenean Lindau Other Clinician: Referring Physician: Treating Physician/Extender: Elise Benne in Treatment:  9 Education Assessment Education Provided To: Patient Education Topics Provided Pressure: Methods: Explain/Verbal, Printed Responses: State content correctly Smoking and Wound Healing: Methods: Explain/Verbal, Printed Responses: State content correctly Wound/Skin Impairment: Methods: Explain/Verbal, Printed Responses: State content correctly Electronic Signature(s) Signed: 05/07/2022 4:29:20 PM By: Lorrin Jackson Entered By: Lorrin Jackson on 05/07/2022 09:10:25 -------------------------------------------------------------------------------- Wound Assessment Details Patient Name: Date of Service: [name] Lax C. 05/07/2022 8:45 A M Medical Record Number: [id number] Patient Account Number: [id number] Date of Birth/Sex: Treating RN: Dec 24, 1960 (61 y.o. [name] Primary Care Sharline Lehane: Jenean Lindau Other Clinician: Referring Maliq Pilley: Treating Jerlyn Pain/Extender: Elise Benne in Treatment: 9 Wound Status Wound Number: 1 Primary Diabetic Wound/Ulcer of the Lower Extremity Etiology: Wound Location: Left Calcaneus Wound Open Wounding Event: Other Lesion Status: Date Acquired: 10/06/2021 Comorbid Anemia, Chronic Obstructive Pulmonary Disease (COPD), Weeks Of Treatment: 9 History: Congestive Heart Failure, Hypertension, Myocardial Infarction, Clustered Wound: No Peripheral Venous Disease, Type II Diabetes, Neuropathy Photos Wound Measurements Length: (cm) 1.8 Width: (cm) 1.1 Depth: (cm) 0.2 Area: (cm) 1.555 Volume: (cm) 0.311 % Reduction in Area: 64.6% % Reduction in Volume: 64.7% Epithelialization: Medium (34-66%) Tunneling: No Undermining: No Wound Description Classification: Grade 2 Wound Margin: Distinct, outline attached Exudate Amount: Medium Exudate Type: Serosanguineous Exudate Color: red, brown Foul Odor After Cleansing: No Slough/Fibrino Yes Wound Bed Granulation Amount: Large (67-100%) Exposed  Structure Granulation Quality: Red, Pink Fascia Exposed: No Necrotic Amount: Small (1-33%) Fat Layer (Subcutaneous Tissue) Exposed: Yes Necrotic Quality: Adherent Slough Tendon Exposed: No Muscle Exposed: No Joint Exposed: No Bone Exposed: No Treatment Notes Wound #1 (Calcaneus) Wound Laterality: Left Cleanser Soap and Water Discharge Instruction: May shower and wash wound with dial antibacterial soap and water prior to dressing change. Wound Cleanser Discharge Instruction: Cleanse the wound with wound cleanser prior to applying a clean dressing using gauze sponges, not tissue or cotton balls. Peri-Wound Care Triamcinolone 15 (g) Discharge Instruction: Use triamcinolone 15 (g) as directed Sween Lotion (Moisturizing lotion) Discharge Instruction: Apply moisturizing lotion as directed Topical Primary Dressing Hydrofera Blue Ready Foam, 2.5 x2.5 in Discharge Instruction: Apply to wound bed as instructed Secondary Dressing ABD Pad, 8x10 Discharge Instruction: Apply over primary dressing as directed. Secured With Compression Wrap ThreePress (3 layer compression wrap) Discharge Instruction: Apply three layer compression as directed. Compression Stockings Add-Ons Electronic Signature(s) Signed: 05/07/2022 4:29:20 PM By: Lorrin Jackson Signed: 05/07/2022 4:49:08 PM By: Erenest Blank Entered By: Erenest Blank on 05/07/2022 09:09:52 -------------------------------------------------------------------------------- Vitals Details Patient Name: Date of Service: [name] Lax C. 05/07/2022 8:45 A M Medical Record Number: [id number] Patient Account Number: [id number] Date of Birth/Sex: Treating RN: 01-Sep-1961 (61 y.o. [name] Primary Care Brodie Scovell: Jenean Lindau Other Clinician: Referring Dariane Natzke: Treating Johnnie Goynes/Extender: Elise Benne in Treatment: 9 Vital Signs Time Taken: 08:53 Pulse (bpm): 83 Height (in): 66 Respiratory Rate  (breaths/min): 18 Weight (lbs): 339 Blood Pressure (mmHg): 141/76 Body Mass Index (BMI): 54.7 Capillary Blood Glucose (mg/dl): 256 Reference Range: 80 - 120 mg / dl Electronic Signature(s) Signed: 05/07/2022 4:49:08 PM By: Erenest Blank Entered By: Erenest Blank on 05/07/2022 08:54:04

## 2022-05-07 NOTE — Progress Notes (Signed)
Giesler, Megahn C. ([id number]) Visit Report for 05/07/2022 Chief Complaint Document Details Patient Name: Date of Service: [name] 05/07/2022 8:45 A M Medical Record Number: [id number] Patient Account Number: [id number] Date of Birth/Sex: Treating RN: 1961-06-09 (61 y.o. [name] Primary Care Provider: Jenean Lindau Other Clinician: Referring Provider: Treating Provider/Extender: Elise Benne in Treatment: 9 Information Obtained from: Patient Chief Complaint 03/05/2022; left foot wound Electronic Signature(s) Signed: 05/07/2022 12:24:18 PM By: Kalman Shan DO Entered By: Kalman Shan on 05/07/2022 09:23:36 -------------------------------------------------------------------------------- HPI Details Patient Name: Date of Service: [name] Lax C. 05/07/2022 8:45 A M Medical Record Number: [id number] Patient Account Number: [id number] Date of Birth/Sex: Treating RN: Jun 12, 1961 (61 y.o. [name] Primary Care Provider: Jenean Lindau Other Clinician: Referring Provider: Treating Provider/Extender: Elise Benne in Treatment: 9 History of Present Illness HPI Description: Admission 03/05/2022 Ms. [name] is a 61 year old female with a past medical history of uncontrolled insulin-dependent type 2 diabetes, tobacco user and chronic diastolic heart failure that presents to the clinic for a 49-month history of wound to her left heel. She states she had a left ankle fusion in September 2022. She states that she always had a wound after the surgery and it never healed. She has home health and they have been doing compression wraps along with silver alginate to the wound bed. She currently denies signs of infection. 6/8; this is a 61 year old woman with type 2 diabetes. She developed a wound on her left Achilles heel just above the tip of the heel in the setting of recurrent ankle surgeries in late 2022. I  have seen some of these results from either the cast or the surgical boots that are put on after these operations. She thinks this may be the case. And a sense of pressure ulcer. In any case that she has been using Santyl Hydrofera Blue under compression. She is not wearing any footwear at home as she cannot find anything to accommodate the wrap 6/22; patient presents for follow-up. She has home health that comes out once a week to help with dressing changes. She has no issues or complaints today. We have been using Hydrofera Blue under compression therapy. 7/6; patient presents for follow-up. She states that home health did not come out for the past 2 weeks. It is unclear why. We have been using Hydrofera Blue and Santyl under compression therapy. 7/13; patient presents for follow-up. She did not take the oral antibiotics prescribed at last clinic visit. We have been using Hydrofera Blue with gentamicin/mupirocin ointment under compression therapy. She has no issues or complaints today. She denies signs of infection. 7/20; patient presents for follow-up. We have been using Hydrofera Blue with antibiotic ointment under 3 layer compression. She has home health that change the dressing once. They put collagen on the wound bed. She also reports falling yesterday and hitting her right foot. She has no pain to the area today. 7/27; patient presents for follow-up. We have been using Hydrofera Blue under 3 layer compression. She has home health that changes the dressing. She has no issues or complaints today. 8/3; patient presents for follow-up. We continues to use Hydrofera Blue under 3 layer compression. She has no issues or complaints today. She has been approved for [employer] at 100%. Electronic Signature(s) Signed: 05/07/2022 12:24:18 PM By: Kalman Shan DO Entered By: Kalman Shan on 05/07/2022 09:24:10 -------------------------------------------------------------------------------- Physical  Exam Details Patient Name: Date of Service: [name], A  MY C. 05/07/2022 8:45 A M Medical Record Number: [id number] Patient Account Number: [id number] Date of Birth/Sex: Treating RN: 03/03/1961 (61 y.o. [name] Primary Care Provider: Jenean Lindau Other Clinician: Referring Provider: Treating Provider/Extender: Elise Benne in Treatment: 9 Constitutional respirations regular, non-labored and within target range for patient.. Cardiovascular 2+ dorsalis pedis/posterior tibialis pulses. Psychiatric pleasant and cooperative. Notes Left foot: T the posterior ankle there is an open wound with granulation tissue throughout. No surrounding signs of infection. o Electronic Signature(s) Signed: 05/07/2022 12:24:18 PM By: Kalman Shan DO Entered By: Kalman Shan on 05/07/2022 09:24:27 -------------------------------------------------------------------------------- Physician Orders Details Patient Name: Date of Service: [name] Lax C. 05/07/2022 8:45 A M Medical Record Number: [id number] Patient Account Number: [id number] Date of Birth/Sex: Treating RN: March 06, 1961 (62 y.o. [name] Primary Care Provider: Jenean Lindau Other Clinician: Referring Provider: Treating Provider/Extender: Elise Benne in Treatment: 9 Verbal / Phone Orders: No Diagnosis Coding ICD-10 Coding Code Description [phone] Non-pressure chronic ulcer of other part of left foot with fat layer exposed E11.621 Type 2 diabetes mellitus with foot ulcer E66.01 Morbid (severe) obesity due to excess calories Follow-up Appointments ppointment in 1 week. - Friday 05/15/22 @ 8:45am Dr. Heber Corning and Leveda Anna (Room # 7) Return A Cellular or Tissue Based Products Cellular or Tissue Based Product Type: - Run IVR for [employer] and Epicord=100% covered 05/07/22: Epicord ordered Bathing/ Shower/ Hygiene May shower with protection but do not get wound  dressing(s) wet. - May use cast protector bag from [facility], [facility] or [employer] Edema Control - Lymphedema / SCD / Other Elevate legs to the level of the heart or above for 30 minutes daily and/or when sitting, a frequency of: Avoid standing for long periods of time. Additional Orders / Instructions Follow Nutritious Diet - Monitor/Control Blood Sugar Home Health No change in wound care orders this week; continue Home Health for wound care. May utilize formulary equivalent dressing for wound treatment orders unless otherwise specified. - Skilled nursing for wound care 1x and wrap changed in office 1x week. every other week when not coming into wound center home health to change twice a week. Other Home Health Orders/Instructions: - [facility] Wound Treatment Wound #1 - Calcaneus Wound Laterality: Left Cleanser: Soap and Water (Home Health) 2 x Per Week/30 Days Discharge Instructions: May shower and wash wound with dial antibacterial soap and water prior to dressing change. Cleanser: Wound Cleanser 2 x Per Week/30 Days Discharge Instructions: Cleanse the wound with wound cleanser prior to applying a clean dressing using gauze sponges, not tissue or cotton balls. Peri-Wound Care: Triamcinolone 15 (g) 2 x Per Week/30 Days Discharge Instructions: Use triamcinolone 15 (g) as directed Peri-Wound Care: Sween Lotion (Moisturizing lotion) (Home Health) 2 x Per Week/30 Days Discharge Instructions: Apply moisturizing lotion as directed Prim Dressing: Hydrofera Blue Ready Foam, 2.5 x2.5 in (Home Health) 2 x Per Week/30 Days ary Discharge Instructions: Apply to wound bed as instructed Secondary Dressing: ABD Pad, 8x10 (Home Health) 2 x Per Week/30 Days Discharge Instructions: Apply over primary dressing as directed. Compression Wrap: ThreePress (3 layer compression wrap) (Home Health) 2 x Per Week/30 Days Discharge Instructions: Apply three layer compression as directed. Electronic Signature(s) Signed:  05/07/2022 12:24:18 PM By: Kalman Shan DO Entered By: Kalman Shan on 05/07/2022 09:24:33 -------------------------------------------------------------------------------- Problem List Details Patient Name: Date of Service: [name] Lax C. 05/07/2022 8:45 A M Medical Record Number: [id number] Patient Account Number: [id number] Date of Birth/Sex: Treating RN:  January 14, 1961 (60 y.o. [name] Primary Care Provider: [name] Other Clinician: Referring Provider: Treating Provider/Extender: [name] in Treatment: 9 Active Problems ICD-10 Encounter Code Description Active Date MDM Diagnosis [phone] Non-pressure chronic ulcer of other part of left foot with fat layer exposed 03/05/2022 No Yes E11.621 Type 2 diabetes mellitus with foot ulcer 03/05/2022 No Yes E66.01 Morbid (severe) obesity due to excess calories 03/05/2022 No Yes Inactive Problems Resolved Problems Electronic Signature(s) Signed: 05/07/2022 12:24:18 PM By: Geralyn Corwin DO Entered By: Geralyn Corwin on 05/07/2022 09:23:26 -------------------------------------------------------------------------------- Progress Note Details Patient Name: Date of Service: [name] Bushy C. 05/07/2022 8:45 A M Medical Record Number: [id number] Patient Account Number: [id number] Date of Birth/Sex: Treating RN: December 11, 1960 (61 y.o. [name] Primary Care Provider: [name] Other Clinician: Referring Provider: Treating Provider/Extender: [name] in Treatment: 9 Subjective Chief Complaint Information obtained from Patient 03/05/2022; left foot wound History of Present Illness (HPI) Admission 03/05/2022 Ms. Jaqlyn Borski is a 61 year old female with a past medical history of uncontrolled insulin-dependent type 2 diabetes, tobacco user and chronic diastolic heart failure that presents to the clinic for a 55-month history of wound to her left heel.  She states she had a left ankle fusion in September 2022. She states that she always had a wound after the surgery and it never healed. She has home health and they have been doing compression wraps along with silver alginate to the wound bed. She currently denies signs of infection. 6/8; this is a 61 year old woman with type 2 diabetes. She developed a wound on her left Achilles heel just above the tip of the heel in the setting of recurrent ankle surgeries in late 2022. I have seen some of these results from either the cast or the surgical boots that are put on after these operations. She thinks this may be the case. And a sense of pressure ulcer. In any case that she has been using Santyl Hydrofera Blue under compression. She is not wearing any footwear at home as she cannot find anything to accommodate the wrap 6/22; patient presents for follow-up. She has home health that comes out once a week to help with dressing changes. She has no issues or complaints today. We have been using Hydrofera Blue under compression therapy. 7/6; patient presents for follow-up. She states that home health did not come out for the past 2 weeks. It is unclear why. We have been using Hydrofera Blue and Santyl under compression therapy. 7/13; patient presents for follow-up. She did not take the oral antibiotics prescribed at last clinic visit. We have been using Hydrofera Blue with gentamicin/mupirocin ointment under compression therapy. She has no issues or complaints today. She denies signs of infection. 7/20; patient presents for follow-up. We have been using Hydrofera Blue with antibiotic ointment under 3 layer compression. She has home health that change the dressing once. They put collagen on the wound bed. She also reports falling yesterday and hitting her right foot. She has no pain to the area today. 7/27; patient presents for follow-up. We have been using Hydrofera Blue under 3 layer compression. She has home  health that changes the dressing. She has no issues or complaints today. 8/3; patient presents for follow-up. We continues to use Hydrofera Blue under 3 layer compression. She has no issues or complaints today. She has been approved for Epicord at 100%. Patient History Information obtained from Patient, Chart. Family History Cancer - Father,Mother, Diabetes -  Mother,Maternal Grandparents,Paternal Grandparents, Heart Disease - Siblings,Maternal Grandparents, Hypertension - Siblings, Tuberculosis - Maternal Grandparents,Paternal Grandparents. Social History Current every day smoker, Marital Status - Single, Alcohol Use - Never, Drug Use - No History, Caffeine Use - Daily. 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 Vitals Time Taken: 8:53 AM, Height: 66 in, Weight: 339 lbs, BMI: 54.7, Pulse: 83 bpm, Respiratory Rate: 18 breaths/min, Blood Pressure: 141/76 mmHg, Capillary Blood Glucose: 256 mg/dl. Cardiovascular 2+ dorsalis pedis/posterior tibialis pulses. Psychiatric pleasant and cooperative. General Notes: Left foot: T the posterior ankle there is an open wound with granulation tissue throughout. No surrounding signs of infection. o Integumentary (Hair, Skin) Wound #1 status is Open. Original cause of wound was Other Lesion. The date acquired was: 10/06/2021. The  wound has been in treatment 9 weeks. The wound is located on the Left Calcaneus. The wound measures 1.8cm length x 1.1cm width x 0.2cm depth; 1.555cm^2 area and 0.311cm^3 volume. There is Fat Layer (Subcutaneous Tissue) exposed. There is no tunneling or undermining noted. There is a medium amount of serosanguineous drainage noted. The wound margin is distinct with the outline attached to the wound base. There is large (67-100%) red, pink granulation within the wound bed. There is a small (1-33%) amount of necrotic tissue within the wound bed including Adherent Slough. Assessment Active Problems ICD-10 Non-pressure chronic ulcer of other part of left foot with fat layer exposed Type 2 diabetes mellitus with foot ulcer Morbid (severe) obesity due to excess calories Patient's wound has shown improvement in size in appearance since last clinic visit. She has been approved for [employer] and She would like to proceed with this. We will order for next clinic visit. For now we will continue with [facility] under compression therapy. Procedures Wound #1 Pre-procedure diagnosis of Wound #1 is a Diabetic Wound/Ulcer of the Lower Extremity located on the Left Calcaneus . There was a Three Layer Compression Therapy Procedure by Antonieta Iba, RN. Post procedure Diagnosis Wound #1: Same as Pre-Procedure Plan Follow-up Appointments: Return Appointment in 1 week. - Friday 05/15/22 @ 8:45am Dr. Mikey Bussing and Lennox Laity (Room # 7) Cellular or Tissue Based Products: Cellular or Tissue Based Product Type: - Run IVR for EpiFix and Epicord=100% covered 05/07/22: Epicord ordered Bathing/ Shower/ Hygiene: May shower with protection but do not get wound dressing(s) wet. - May use cast protector bag from [facility], [facility] or [employer] Edema Control - Lymphedema / SCD / Other: Elevate legs to the level of the heart or above for 30 minutes daily and/or when sitting, a frequency of: Avoid standing for long periods of  time. Additional Orders / Instructions: Follow Nutritious Diet - Monitor/Control Blood Sugar Home Health: No change in wound care orders this week; continue Home Health for wound care. May utilize formulary equivalent dressing for wound treatment orders unless otherwise specified. - Skilled nursing for wound care 1x and wrap changed in office 1x week. every other week when not coming into wound center home health to change twice a week. Other Home Health Orders/Instructions: Aldine Contes [facility]  WOUND #1: - Calcaneus Wound Laterality: Left Cleanser: Soap and Water (Home Health) 2 x Per Week/30 Days Discharge Instructions: May shower and wash wound with dial antibacterial soap and water prior to dressing change. Cleanser: Wound Cleanser 2 x Per Week/30 Days Discharge Instructions: Cleanse the wound with wound cleanser prior to applying a clean dressing using gauze sponges, not tissue or cotton balls. Peri-Wound Care: Triamcinolone 15 (g) 2 x Per Week/30 Days Discharge Instructions: Use triamcinolone 15 (g) as directed Peri-Wound Care: Sween Lotion (Moisturizing lotion) (Home Health) 2 x Per Week/30 Days Discharge Instructions: Apply moisturizing lotion as directed Prim Dressing: Hydrofera Blue Ready Foam, 2.5 x2.5 in (Home Health) 2 x Per Week/30 Days ary Discharge Instructions: Apply to wound bed as instructed Secondary Dressing: ABD Pad, 8x10 (Home Health) 2 x Per Week/30 Days Discharge Instructions: Apply over primary dressing as directed. Com pression Wrap: ThreePress (3 layer compression wrap) (Home Health) 2 x Per Week/30 Days Discharge Instructions: Apply three layer compression as directed. 1. Hydrofera Blue under 3 layer compression Electronic Signature(s) Signed: 05/07/2022 12:24:18 PM By: Geralyn Corwin DO Entered By: Geralyn Corwin on 05/07/2022 09:25:18 -------------------------------------------------------------------------------- HxROS Details Patient Name: Date of  Service: [name] Bushy C. 05/07/2022 8:45 A M Medical Record Number: [id number] Patient Account Number: [id number] Date of Birth/Sex: Treating RN: 12-Apr-1961 (61 y.o. [name] Primary Care Provider: [name] Other Clinician: Referring Provider: Treating Provider/Extender: [name] in Treatment: 9 Information Obtained From Patient Chart Hematologic/Lymphatic Medical History: Positive for: Anemia Respiratory Medical History: Positive for: Chronic Obstructive Pulmonary Disease (COPD) Cardiovascular Medical History: Positive for: Congestive Heart Failure - Weighs self daily; Hypertension; Myocardial Infarction; Peripheral Venous Disease Gastrointestinal Medical History: Past Medical History Notes: GERD, Peptic Ulcer Disease Endocrine Medical History: Positive for: Type II Diabetes Time with diabetes: 16 years Treated with: Insulin, Oral agents Blood sugar tested every day: Yes Tested : Twice daily Musculoskeletal Medical History: Past Medical History Notes: Broke left leg 3 years ago, 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, foot fused Neurologic Medical History: Positive for: Neuropathy Psychiatric Medical History: Past Medical History Notes: Depression Immunizations Pneumococcal Vaccine: Received Pneumococcal Vaccination: No Implantable Devices None Family and Social History Cancer: Yes - Father,Mother; Diabetes: Yes - Mother,Maternal Grandparents,Paternal Grandparents; Heart Disease: Yes - Siblings,Maternal Grandparents; Hypertension: Yes - Siblings; Tuberculosis: Yes - Maternal Grandparents,Paternal Grandparents; Current every day smoker; Marital Status - Single; Alcohol Use: Never; Drug Use: No History; Caffeine Use: Daily; Financial Concerns: No; Food, Clothing or Shelter Needs: No; Support System Lacking: No; Transportation Concerns: No Electronic Signature(s) Signed: 05/07/2022 12:24:18 PM By: Geralyn Corwin DO Signed:  05/07/2022 4:29:20 PM By: Antonieta Iba Entered By: Geralyn Corwin on 05/07/2022 09:24:14 -------------------------------------------------------------------------------- SuperBill Details Patient Name: Date of Service: [name] Bushy C. 05/07/2022 Medical Record Number: [id number] Patient Account Number: [id number] Date of Birth/Sex: Treating RN: 04-30-1961 (61 y.o. [name] Primary Care Provider: [name] Other Clinician: Referring Provider: Treating Provider/Extender: [name] in Treatment: 9 Diagnosis Coding ICD-10 Codes Code Description [phone] Non-pressure chronic ulcer of other part of left foot with fat layer exposed E11.621 Type 2 diabetes mellitus with foot ulcer E66.01 Morbid (severe) obesity due to excess calories Facility Procedures CPT4 Code: [id number] Description: (Facility Use Only) [phone] - APPLY MULTLAY COMPRS LWR LT LEG ICD-10 Diagnosis Description L97.522 Non-pressure chronic ulcer of other part of left foot with fat layer exposed Modifier: Quantity: 1 Physician Procedures : CPT4 Code Description Modifier [id number] - WC PHYS LEVEL 3 -  EST PT 1 ICD-10 Diagnosis Description L97.522 Non-pressure chronic ulcer of other part of left foot with fat layer exposed E11.621 Type 2 diabetes mellitus with foot ulcer E66.01 Morbid  (severe) obesity due to excess calories Quantity: Electronic Signature(s) Signed: 05/07/2022 12:24:18 PM By: Kalman Shan DO Entered By: Kalman Shan on 05/07/2022 10:12:01

## 2022-05-15 ENCOUNTER — Encounter (HOSPITAL_BASED_OUTPATIENT_CLINIC_OR_DEPARTMENT_OTHER): Payer: 59 | Admitting: Internal Medicine

## 2022-05-15 DIAGNOSIS — L97522 Non-pressure chronic ulcer of other part of left foot with fat layer exposed: Secondary | ICD-10-CM

## 2022-05-15 DIAGNOSIS — E11621 Type 2 diabetes mellitus with foot ulcer: Secondary | ICD-10-CM | POA: Diagnosis not present

## 2022-05-15 NOTE — Progress Notes (Signed)
Adebayo, Elinore C. ([id number]) Visit Report for 05/15/2022 Arrival Information Details Patient Name: Date of Service: [name] 05/15/2022 8:45 A M Medical Record Number: [id number] Patient Account Number: [id number] Date of Birth/Sex: Treating RN: 02/19/1961 (61 y.o. [name] Primary Care Vernia Teem: Jenean Lindau Other Clinician: Referring Emery Dupuy: Treating Jkwon Treptow/Extender: Elise Benne in Treatment: 10 Visit Information History Since Last Visit Added or deleted any medications: No Patient Arrived: Wheel Chair Any new allergies or adverse reactions: No Arrival Time: 08:43 Had a fall or experienced change in Yes Accompanied By: daughter activities of daily living that may affect Transfer Assistance: None risk of falls: Patient Identification Verified: Yes Signs or symptoms of abuse/neglect since last visito No Secondary Verification Process Completed: Yes Hospitalized since last visit: No Patient Requires Transmission-Based Precautions: No Implantable device outside of the clinic excluding No Patient Has Alerts: No cellular tissue based products placed in the center since last visit: Has Dressing in Place as Prescribed: No Pain Present Now: No Electronic Signature(s) Signed: 05/15/2022 12:35:31 PM By: Erenest Blank Entered By: Erenest Blank on 05/15/2022 08:44:58 -------------------------------------------------------------------------------- Compression Therapy Details Patient Name: Date of Service: [name] Lax C. 05/15/2022 8:45 A M Medical Record Number: [id number] Patient Account Number: [id number] Date of Birth/Sex: Treating RN: 12/20/1960 (61 y.o. [name] Primary Care Mikle Sternberg: Jenean Lindau Other Clinician: Referring Fiore Detjen: Treating Ronel Rodeheaver/Extender: Elise Benne in Treatment: 10 Compression Therapy Performed for Wound Assessment: Wound #1 Left Calcaneus Performed By:  Clinician Lorrin Jackson, RN Compression Type: Three Layer Post Procedure Diagnosis Same as Pre-procedure Electronic Signature(s) Signed: 05/15/2022 12:27:06 PM By: Lorrin Jackson Entered By: Lorrin Jackson on 05/15/2022 09:41:44 -------------------------------------------------------------------------------- Encounter Discharge Information Details Patient Name: Date of Service: [name] Lax C. 05/15/2022 8:45 A M Medical Record Number: [id number] Patient Account Number: [id number] Date of Birth/Sex: Treating RN: [birth year] (61 y.o. [name] Primary Care Khalik Pewitt: Jenean Lindau Other Clinician: Referring Bernhardt Riemenschneider: Treating Tyshay Adee/Extender: Elise Benne in Treatment: 10 Encounter Discharge Information Items Post Procedure Vitals Discharge Condition: Stable Temperature (F): 98.4 Ambulatory Status: Wheelchair Pulse (bpm): 85 Discharge Destination: Home Respiratory Rate (breaths/min): 18 Transportation: Private Auto Blood Pressure (mmHg): 127/75 Accompanied By: daughter Schedule Follow-up Appointment: Yes Clinical Summary of Care: Provided on 05/15/2022 Form Type Recipient Paper Patient Patient Electronic Signature(s) Signed: 05/15/2022 12:27:06 PM By: Lorrin Jackson Entered By: Lorrin Jackson on 05/15/2022 10:02:07 -------------------------------------------------------------------------------- Lower Extremity Assessment Details Patient Name: Date of Service: [name] Lax C. 05/15/2022 8:45 A M Medical Record Number: [id number] Patient Account Number: [id number] Date of Birth/Sex: Treating RN: 1961-04-26 (61 y.o. [name] Primary Care Arneisha Kincannon: Jenean Lindau Other Clinician: Referring Tirso Laws: Treating Magda Muise/Extender: Elise Benne in Treatment: 10 Edema Assessment Assessed: Shirlyn Goltz: No] Patrice Paradise: No] Edema: [Left: Ye] [Right: s] Calf Left: Right: Point of Measurement: 28 cm From  Medial Instep 54.3 cm Ankle Left: Right: Point of Measurement: 3 cm From Medial Instep 34.4 cm Electronic Signature(s) Signed: 05/15/2022 12:27:06 PM By: Lorrin Jackson Signed: 05/15/2022 12:35:31 PM By: Erenest Blank Entered By: Erenest Blank on 05/15/2022 08:57:00 -------------------------------------------------------------------------------- Multi Wound Chart Details Patient Name: Date of Service: [name] Lax C. 05/15/2022 8:45 A M Medical Record Number: [id number] Patient Account Number: [id number] Date of Birth/Sex: Treating RN: 05-11-1961 (61 y.o. 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: Extremity Anemia, Chronic Obstructive N/A N/A Comorbid History: Pulmonary Disease (COPD), Congestive Heart Failure, Hypertension, Myocardial Infarction, Peripheral Venous Disease, Type II Diabetes, Neuropathy 10/06/2021 N/A N/A Date Acquired: 10 N/A N/A Weeks of Treatment: Open N/A N/A Wound Status: No N/A N/A Wound Recurrence: 1.5x1x0.1 N/A N/A Measurements L x W x D (cm) 1.178 N/A N/A A (cm) : rea 0.118 N/A N/A Volume (cm) : 73.20% N/A N/A % Reduction in A rea: 86.60% N/A N/A % Reduction in Volume: Grade 2 N/A N/A Classification: Medium N/A N/A Exudate A mount: Serosanguineous N/A N/A Exudate Type: red, brown N/A N/A Exudate Color: Distinct, outline attached N/A N/A Wound Margin: Large (67-100%) N/A N/A Granulation A mount: Red, Pink N/A N/A Granulation Quality: None Present (0%) N/A  N/A Necrotic A mount: Fat Layer (Subcutaneous Tissue): Yes N/A N/A Exposed Structures: Fascia: No Tendon: No Muscle: No Joint: No Bone: No Medium (34-66%) N/A N/A Epithelialization: Debridement - Excisional N/A N/A Debridement: Pre-procedure Verification/Time Out 09:32 N/A N/A Taken: Lidocaine 4% Topical Solution N/A N/A Pain Control: Subcutaneous, Slough N/A N/A Tissue Debrided: Skin/Subcutaneous Tissue N/A N/A Level: 1.5 N/A N/A Debridement A (sq cm): rea Curette N/A N/A Instrument: Minimum N/A N/A Bleeding: Pressure N/A N/A Hemostasis A chieved: Procedure was tolerated well N/A N/A Debridement Treatment Response: 1.5x1x0.1 N/A N/A Post Debridement Measurements L x W x D (cm) 0.118 N/A N/A Post Debridement Volume: (cm) Cellular or Tissue Based Product N/A N/A Procedures Performed: Compression Therapy Debridement Treatment Notes Wound #1 (Calcaneus) Wound Laterality: Left Cleanser Soap and Water Discharge Instruction: May shower and wash wound with dial antibacterial soap and water prior to dressing change. Wound Cleanser Discharge Instruction: Cleanse the wound with wound cleanser prior to applying a clean dressing using gauze sponges, not tissue or cotton balls. Peri-Wound Care Triamcinolone 15 (g) Discharge Instruction: Use triamcinolone 15 (g) as directed Sween Lotion (Moisturizing lotion) Discharge Instruction: Apply moisturizing lotion as directed Topical Primary Dressing Secondary Dressing ADAPTIC TOUCH 3x4.25 in Discharge Instruction: Apply over primary dressing as directed. ABD Pad, 8x10 Discharge Instruction: Apply over primary dressing as directed. Secured With Compression Wrap ThreePress (3 layer compression wrap) Discharge Instruction: Apply three layer compression as directed. Compression Stockings Add-Ons Electronic Signature(s) Signed: 05/15/2022 12:27:06 PM By: Lorrin Jackson Signed: 05/15/2022 1:41:40 PM By: Kalman Shan  DO Entered By: Kalman Shan on 05/15/2022 10:24:08 -------------------------------------------------------------------------------- Multi-Disciplinary Care Plan Details Patient Name: Date of Service: [name] Lax C. 05/15/2022 8:45 A M Medical Record Number: [id number] Patient Account Number: [id number] Date of Birth/Sex: Treating RN: 10-06-1960 (61 y.o. [name] Primary Care Bonham Zingale: Jenean Lindau Other Clinician: Referring Jaysen Wey: Treating Cherith Tewell/Extender: Elise Benne in Treatment: 10 Active Inactive Abuse / Safety / Falls / Self Care Management Nursing Diagnoses: History of Falls Goals: Patient will not experience any injury related to falls Date Initiated: 03/05/2022 Target Resolution Date: 05/28/2022 Goal Status: Active Interventions: Assess fall risk on admission and as needed Assess: immobility, friction, shearing, incontinence upon admission and as needed Assess impairment of mobility on admission and as needed per policy Notes: 7/00/17: Fall prevention ongoing, recent fall. Wound/Skin Impairment Nursing Diagnoses: Impaired tissue integrity Goals: Patient/caregiver will verbalize understanding of skin care regimen Date Initiated: 03/05/2022 Target Resolution Date: 05/28/2022 Goal Status: Active Ulcer/skin breakdown will have a volume reduction  of 30% by week 4 Date Initiated: 03/05/2022 Date Inactivated: 04/30/2022 Target Resolution Date: 05/02/2022 Goal Status: Met Ulcer/skin breakdown will have a volume reduction of 50% by week 8 Date Initiated: 04/30/2022 Target Resolution Date: 05/28/2022 Goal Status: Active Interventions: Assess patient/caregiver ability to obtain necessary supplies Assess patient/caregiver ability to perform ulcer/skin care regimen upon admission and as needed Assess ulceration(s) every visit Provide education on smoking Provide education on ulcer and skin care Treatment Activities: Topical  wound management initiated : 03/05/2022 Notes: 04/30/22: Wound care regimen continues. Electronic Signature(s) Signed: 05/15/2022 12:27:06 PM By: Lorrin Jackson Entered By: Lorrin Jackson on 05/15/2022 09:16:05 -------------------------------------------------------------------------------- Pain Assessment Details Patient Name: Date of Service: [name] Lax C. 05/15/2022 8:45 A M Medical Record Number: [id number] Patient Account Number: [id number] Date of Birth/Sex: Treating RN: May 22, 1961 (61 y.o. [name] Primary Care Cleavon Goldman: Jenean Lindau Other Clinician: Referring Jacoby Zanni: Treating Lavonya Hoerner/Extender: Elise Benne in Treatment: 10 Active Problems Location of Pain Severity and Description of Pain Patient Has Paino No Site Locations Pain Management and Medication Current Pain Management: Electronic Signature(s) Signed: 05/15/2022 12:27:06 PM By: Lorrin Jackson Signed: 05/15/2022 12:35:31 PM By: Erenest Blank Entered By: Erenest Blank on 05/15/2022 08:47:31 -------------------------------------------------------------------------------- Patient/Caregiver Education Details Patient Name: Date of Service: [name]. 8/11/2023andnbsp8:45 A M Medical Record Number: [id number] Patient Account Number: [id number] Date of Birth/Gender: Treating RN: 1961-09-16 (61 y.o. [name] Primary Care Physician: Jenean Lindau Other Clinician: Referring Physician: Treating Physician/Extender: Elise Benne in Treatment: 10 Education Assessment Education Provided To: Patient Education Topics Provided Smoking and Wound Healing: Methods: Explain/Verbal, Printed Responses: State content correctly Wound/Skin Impairment: Methods: Demonstration, Explain/Verbal, Printed Responses: State content correctly Electronic Signature(s) Signed: 05/15/2022 12:27:06 PM By: Lorrin Jackson Entered By: Lorrin Jackson  on 05/15/2022 09:16:34 -------------------------------------------------------------------------------- Wound Assessment Details Patient Name: Date of Service: [name] Lax C. 05/15/2022 8:45 A M Medical Record Number: [id number] Patient Account Number: [id number] Date of Birth/Sex: Treating RN: Mar 31, 1961 (61 y.o. [name] Primary Care Demontrez Rindfleisch: Jenean Lindau Other Clinician: Referring Demian Maisel: Treating Maleea Camilo/Extender: Elise Benne in Treatment: 10 Wound Status Wound Number: 1 Primary Diabetic Wound/Ulcer of the Lower Extremity Etiology: Wound Location: Left Calcaneus Wound Open Wounding Event: Other Lesion Status: Date Acquired: 10/06/2021 Comorbid Anemia, Chronic Obstructive Pulmonary Disease (COPD), Weeks Of Treatment: 10 History: Congestive Heart Failure, Hypertension, Myocardial Infarction, Clustered Wound: No Peripheral Venous Disease, Type II Diabetes, Neuropathy Photos Wound Measurements Length: (cm) 1.5 Width: (cm) 1 Depth: (cm) 0.1 Area: (cm) 1.178 Volume: (cm) 0.118 % Reduction in Area: 73.2% % Reduction in Volume: 86.6% Epithelialization: Medium (34-66%) Tunneling: No Undermining: No Wound Description Classification: Grade 2 Wound Margin: Distinct, outline attached Exudate Amount: Medium Exudate Type: Serosanguineous Exudate Color: red, brown Foul Odor After Cleansing: No Slough/Fibrino Yes Wound Bed Granulation Amount: Large (67-100%) Exposed Structure Granulation Quality: Red, Pink Fascia Exposed: No Necrotic Amount: None Present (0%) Fat Layer (Subcutaneous Tissue) Exposed: Yes Tendon Exposed: No Muscle Exposed: No Joint Exposed: No Bone Exposed: No Treatment Notes Wound #1 (Calcaneus) Wound Laterality: Left Cleanser Soap and Water Discharge Instruction: May shower and wash wound with dial antibacterial soap and water prior to dressing change. Wound Cleanser Discharge Instruction: Cleanse the  wound with wound cleanser prior to applying a clean dressing using gauze sponges, not tissue or cotton balls. Peri-Wound Care Triamcinolone 15 (g) Discharge Instruction: Use triamcinolone 15 (g) as directed Sween Lotion (Moisturizing lotion) Discharge Instruction: Apply moisturizing lotion as directed  Topical Primary Dressing Secondary Dressing ADAPTIC TOUCH 3x4.25 in Discharge Instruction: Apply over primary dressing as directed. ABD Pad, 8x10 Discharge Instruction: Apply over primary dressing as directed. Secured With Compression Wrap ThreePress (3 layer compression wrap) Discharge Instruction: Apply three layer compression as directed. Compression Stockings Add-Ons Electronic Signature(s) Signed: 05/15/2022 12:27:06 PM By: Lorrin Jackson Signed: 05/15/2022 12:35:31 PM By: Erenest Blank Entered By: Erenest Blank on 05/15/2022 08:58:28 -------------------------------------------------------------------------------- Vitals Details Patient Name: Date of Service: HUFFMA Delane Ginger, [name]. 05/15/2022 8:45 A M Medical Record Number: [id number] Patient Account Number: [id number] Date of Birth/Sex: Treating RN: 02-Nov-1960 (61 y.o. [name] Primary Care Lorain Keast: Jenean Lindau Other Clinician: Referring Livan Hires: Treating Sanjana Folz/Extender: Elise Benne in Treatment: 10 Vital Signs Time Taken: 08:46 Temperature (F): 98.4 Height (in): 66 Pulse (bpm): 85 Weight (lbs): 339 Respiratory Rate (breaths/min): 18 Body Mass Index (BMI): 54.7 Blood Pressure (mmHg): 127/75 Reference Range: 80 - 120 mg / dl Electronic Signature(s) Signed: 05/15/2022 12:35:31 PM By: Erenest Blank Entered By: Erenest Blank on 05/15/2022 08:47:22

## 2022-05-15 NOTE — Progress Notes (Signed)
Taffe, Marrah [name]. ([id number]) Visit Report for 05/15/2022 Chief Complaint Document Details Patient Name: Date of Service: [name] 05/15/2022 8:45 A M Medical Record Number: [id number] Patient Account Number: [id number] Date of Birth/Sex: Treating RN: [birth year] (61 y.o. [name] Primary Care Provider: [name] Other Clinician: Referring Provider: Treating Provider/Extender: [name] in Treatment: 10 Information Obtained from: Patient Chief Complaint 03/05/2022; left foot wound Electronic Signature(s) Signed: 05/15/2022 1:41:40 PM By: Geralyn Corwin DO Entered By: Geralyn Corwin on 05/15/2022 10:24:15 -------------------------------------------------------------------------------- Cellular or Tissue Based Product Details Patient Name: Date of Service: HUFFMA Gretta [name] MY [name]. 05/15/2022 8:45 A M Medical Record Number: [id number] Patient Account Number: [id number] Date of Birth/Sex: Treating RN: 1960/10/09 (61 y.o. [name] Primary Care Provider: [name] Other Clinician: Referring Provider: Treating Provider/Extender: [name] in Treatment: 10 Cellular or Tissue Based Product Type Wound #1 Left Calcaneus Applied to: Performed By: Physician Geralyn Corwin, DO Cellular or Tissue Based Product Type: Epicord Level of Consciousness (Pre-procedure): Awake and Alert Pre-procedure Verification/Time Out Yes - 09:33 Taken: Location: genitalia / hands / feet / multiple digits Wound Size (sq cm): 1.5 Product Size (sq cm): 6 Waste Size (sq cm): 1.5 Waste Reason: wound size Amount of Product Applied (sq cm): 4.5 Instrument Used: Scissors Lot #: [id number] Expiration Date: 01/04/2027 Reconstituted: Yes Solution Type: normal saline Solution Amount: 65ml Lot #: [id number] Solution Expiration Date: 07/05/2022 Secured: Yes Secured With: Steri-Strips Dressing Applied: Yes Primary  Dressing: Adaptic, ABD Level of Consciousness (Post- Awake and Alert procedure): Post Procedure Diagnosis Same as Pre-procedure Electronic Signature(s) Signed: 05/15/2022 12:27:06 PM By: Antonieta Iba Signed: 05/15/2022 1:41:40 PM By: Geralyn Corwin DO Entered By: Antonieta Iba on 05/15/2022 09:40:23 -------------------------------------------------------------------------------- Debridement Details Patient Name: Date of Service: [name]. 05/15/2022 8:45 A M Medical Record Number: [id number] Patient Account Number: [id number] Date of Birth/Sex: Treating RN: 10-23-1960 (61 y.o. [name] Primary Care Provider: [name] Other Clinician: Referring Provider: Treating Provider/Extender: [name] in Treatment: 10 Debridement Performed for Assessment: Wound #1 Left Calcaneus Performed By: Physician Geralyn Corwin, DO Debridement Type: Debridement Severity of Tissue Pre Debridement: Fat layer exposed Level of Consciousness (Pre-procedure): Awake and Alert Pre-procedure Verification/Time Out Yes - 09:32 Taken: Start Time: 09:33 Pain Control: Lidocaine 4% T opical Solution T Area Debrided (L x W): otal 1.5 (cm) x 1 (cm) = 1.5 (cm) Tissue and other material debrided: Non-Viable, Slough, Subcutaneous, Slough Level: Skin/Subcutaneous Tissue Debridement Description: Excisional Instrument: Curette Bleeding: Minimum Hemostasis Achieved: Pressure End Time: 09:35 Response to Treatment: Procedure was tolerated well Level of Consciousness (Post- Awake and Alert procedure): Post Debridement Measurements of Total Wound Length: (cm) 1.5 Width: (cm) 1 Depth: (cm) 0.1 Volume: (cm) 0.118 Character of Wound/Ulcer Post Debridement: Stable Severity of Tissue Post Debridement: Fat layer exposed Post Procedure Diagnosis Same as Pre-procedure Electronic Signature(s) Signed: 05/15/2022 12:27:06 PM By: Antonieta Iba Signed: 05/15/2022  1:41:40 PM By: Geralyn Corwin DO Entered By: Antonieta Iba on 05/15/2022 09:35:57 -------------------------------------------------------------------------------- HPI Details Patient Name: Date of Service: [name]. 05/15/2022 8:45 A M Medical Record Number: [id number] Patient Account Number: [id number] Date of Birth/Sex: Treating RN: 1961/03/10 (61 y.o. [name] Primary Care Provider: [name] Other Clinician: Referring Provider: Treating Provider/Extender: [name] in Treatment: 10 History of Present Illness HPI Description: Admission 03/05/2022 [name] is a 61 year old female with a past medical history of uncontrolled insulin-dependent  type 2 diabetes, tobacco user and chronic diastolic heart failure that presents to the clinic for a 68-month history of wound to her left heel. She states she had a left ankle fusion in September 2022. She states that she always had a wound after the surgery and it never healed. She has home health and they have been doing compression wraps along with silver alginate to the wound bed. She currently denies signs of infection. 6/8; this is a 61 year old woman with type 2 diabetes. She developed a wound on her left Achilles heel just above the tip of the heel in the setting of recurrent ankle surgeries in late 2022. I have seen some of these results from either the cast or the surgical boots that are put on after these operations. She thinks this may be the case. And a sense of pressure ulcer. In any case that she has been using Santyl Hydrofera Blue under compression. She is not wearing any footwear at home as she cannot find anything to accommodate the wrap 6/22; patient presents for follow-up. She has home health that comes out once a week to help with dressing changes. She has no issues or complaints today. We have been using Hydrofera Blue under compression therapy. 7/6; patient presents  for follow-up. She states that home health did not come out for the past 2 weeks. It is unclear why. We have been using Hydrofera Blue and Santyl under compression therapy. 7/13; patient presents for follow-up. She did not take the oral antibiotics prescribed at last clinic visit. We have been using Hydrofera Blue with gentamicin/mupirocin ointment under compression therapy. She has no issues or complaints today. She denies signs of infection. 7/20; patient presents for follow-up. We have been using Hydrofera Blue with antibiotic ointment under 3 layer compression. She has home health that change the dressing once. They put collagen on the wound bed. She also reports falling yesterday and hitting her right foot. She has no pain to the area today. 7/27; patient presents for follow-up. We have been using Hydrofera Blue under 3 layer compression. She has home health that changes the dressing. She has no issues or complaints today. 8/3; patient presents for follow-up. We continues to use Hydrofera Blue under 3 layer compression. She has no issues or complaints today. She has been approved for Epicord at 100%. 8/11; patient presents for follow-up. We have been using Hydrofera Blue under 3 layer compression. She has been approved for Epicord and we have this today. She is in agreement with having this applied. Electronic Signature(s) Signed: 05/15/2022 1:41:40 PM By: Geralyn Corwin DO Entered By: Geralyn Corwin on 05/15/2022 10:25:01 -------------------------------------------------------------------------------- Physical Exam Details Patient Name: Date of Service: Sharol Harness MY [name]. 05/15/2022 8:45 A M Medical Record Number: [id number] Patient Account Number: [id number] Date of Birth/Sex: Treating RN: [birth year] (61 y.o. [name] Primary Care Provider: [name] Other Clinician: Referring Provider: Treating Provider/Extender: [name] in  Treatment: 10 Constitutional respirations regular, non-labored and within target range for patient.. Cardiovascular 2+ dorsalis pedis/posterior tibialis pulses. Psychiatric pleasant and cooperative. Notes Left foot: T the posterior ankle there is an open wound with granulation tissue and slough. No surrounding signs of infection. o Electronic Signature(s) Signed: 05/15/2022 1:41:40 PM By: Geralyn Corwin DO Entered By: Geralyn Corwin on 05/15/2022 10:25:43 -------------------------------------------------------------------------------- Physician Orders Details Patient Name: Date of Service: [name]. 05/15/2022 8:45 A M Medical Record Number: [id number] Patient Account Number: [id number] Date of Birth/Sex: Treating RN:  05/26/1961 (60 y.o. [name] Primary Care Provider: [name] Other Clinician: Referring Provider: Treating Provider/Extender: [name] in Treatment: 10 Verbal / Phone Orders: No Diagnosis Coding ICD-10 Coding Code Description [phone] Non-pressure chronic ulcer of other part of left foot with fat layer exposed E11.621 Type 2 diabetes mellitus with foot ulcer E66.01 Morbid (severe) obesity due to excess calories Follow-up Appointments ppointment in 1 week. - Friday 05/22/22 @ 9:30am Dr. Mikey Bussing and Lennox Laity (Room # 7) Return A Anesthetic (In clinic) Topical Lidocaine 5% applied to wound bed (In clinic) Topical Lidocaine 4% applied to wound bed Cellular or Tissue Based Products Cellular or Tissue Based Product Type: - Run IVR for EpiFix and Epicord=100% covered 05/07/22: Epicord ordered daptic or Mepitel. (DO NOT REMOVE). - Cellular or Tissue Based Product applied to wound bed, secured with steri-strips, cover with A Epicord #1 05/15/25 Bathing/ Shower/ Hygiene May shower with protection but do not get wound dressing(s) wet. - May use cast protector bag from [facility], [facility] or [employer] Edema Control -  Lymphedema / SCD / Other Elevate legs to the level of the heart or above for 30 minutes daily and/or when sitting, a frequency of: Avoid standing for long periods of time. Additional Orders / Instructions Follow Nutritious Diet - Monitor/Control Blood Sugar Home Health New wound care orders this week; continue Home Health for wound care. May utilize formulary equivalent dressing for wound treatment orders unless otherwise specified. - No HH dressing change this week, Skin sub applied Other Home Health Orders/Instructions: - [facility] Wound Treatment Wound #1 - Calcaneus Wound Laterality: Left Cleanser: Soap and Water (Home Health) 2 x Per Week/30 Days Discharge Instructions: May shower and wash wound with dial antibacterial soap and water prior to dressing change. Cleanser: Wound Cleanser 2 x Per Week/30 Days Discharge Instructions: Cleanse the wound with wound cleanser prior to applying a clean dressing using gauze sponges, not tissue or cotton balls. Peri-Wound Care: Triamcinolone 15 (g) 2 x Per Week/30 Days Discharge Instructions: Use triamcinolone 15 (g) as directed Peri-Wound Care: Sween Lotion (Moisturizing lotion) (Home Health) 2 x Per Week/30 Days Discharge Instructions: Apply moisturizing lotion as directed Secondary Dressing: ADAPTIC TOUCH 3x4.25 in 2 x Per Week/30 Days Discharge Instructions: Apply over primary dressing as directed. Secondary Dressing: ABD Pad, 8x10 (Home Health) 2 x Per Week/30 Days Discharge Instructions: Apply over primary dressing as directed. Compression Wrap: ThreePress (3 layer compression wrap) (Home Health) 2 x Per Week/30 Days Discharge Instructions: Apply three layer compression as directed. Electronic Signature(s) Signed: 05/15/2022 1:41:40 PM By: Geralyn Corwin DO Entered By: Geralyn Corwin on 05/15/2022 10:25:50 -------------------------------------------------------------------------------- Problem List Details Patient Name: Date of  Service: [name]. 05/15/2022 8:45 A M Medical Record Number: [id number] Patient Account Number: [id number] Date of Birth/Sex: Treating RN: 03/26/1961 (61 y.o. [name] Primary Care Provider: [name] Other Clinician: Referring Provider: Treating Provider/Extender: [name] in Treatment: 10 Active Problems ICD-10 Encounter Code Description Active Date MDM Diagnosis [phone] Non-pressure chronic ulcer of other part of left foot with fat layer exposed 03/05/2022 No Yes E11.621 Type 2 diabetes mellitus with foot ulcer 03/05/2022 No Yes E66.01 Morbid (severe) obesity due to excess calories 03/05/2022 No Yes Inactive Problems Resolved Problems Electronic Signature(s) Signed: 05/15/2022 1:41:40 PM By: Geralyn Corwin DO Entered By: Geralyn Corwin on 05/15/2022 10:24:02 -------------------------------------------------------------------------------- Progress Note Details Patient Name: Date of Service: [name]. 05/15/2022 8:45 A M Medical Record Number: [id number] Patient  Account Number: [id number] Date of Birth/Sex: Treating RN: 1961/09/17 (61 y.o. [name] Primary Care Provider: [name] Other Clinician: Referring Provider: Treating Provider/Extender: [name] in Treatment: 10 Subjective Chief Complaint Information obtained from Patient 03/05/2022; left foot wound History of Present Illness (HPI) Admission 03/05/2022 Ms. Ladonna Zentz is a 61 year old female with a past medical history of uncontrolled insulin-dependent type 2 diabetes, tobacco user and chronic diastolic heart failure that presents to the clinic for a 55-month history of wound to her left heel. She states she had a left ankle fusion in September 2022. She states that she always had a wound after the surgery and it never healed. She has home health and they have been doing compression wraps along with  silver alginate to the wound bed. She currently denies signs of infection. 6/8; this is a 61 year old woman with type 2 diabetes. She developed a wound on her left Achilles heel just above the tip of the heel in the setting of recurrent ankle surgeries in late 2022. I have seen some of these results from either the cast or the surgical boots that are put on after these operations. She thinks this may be the case. And a sense of pressure ulcer. In any case that she has been using Santyl Hydrofera Blue under compression. She is not wearing any footwear at home as she cannot find anything to accommodate the wrap 6/22; patient presents for follow-up. She has home health that comes out once a week to help with dressing changes. She has no issues or complaints today. We have been using Hydrofera Blue under compression therapy. 7/6; patient presents for follow-up. She states that home health did not come out for the past 2 weeks. It is unclear why. We have been using Hydrofera Blue and Santyl under compression therapy. 7/13; patient presents for follow-up. She did not take the oral antibiotics prescribed at last clinic visit. We have been using Hydrofera Blue with gentamicin/mupirocin ointment under compression therapy. She has no issues or complaints today. She denies signs of infection. 7/20; patient presents for follow-up. We have been using Hydrofera Blue with antibiotic ointment under 3 layer compression. She has home health that change the dressing once. They put collagen on the wound bed. She also reports falling yesterday and hitting her right foot. She has no pain to the area today. 7/27; patient presents for follow-up. We have been using Hydrofera Blue under 3 layer compression. She has home health that changes the dressing. She has no issues or complaints today. 8/3; patient presents for follow-up. We continues to use Hydrofera Blue under 3 layer compression. She has no issues or complaints  today. She has been approved for Epicord at 100%. 8/11; patient presents for follow-up. We have been using Hydrofera Blue under 3 layer compression. She has been approved for Epicord and we have this today. She is in agreement with having this applied. Patient History Information obtained from Patient, Chart. Family History Cancer - Father,Mother, Diabetes - Mother,Maternal Grandparents,Paternal Grandparents, Heart Disease - Siblings,Maternal Grandparents, Hypertension - Siblings, Tuberculosis - Maternal Grandparents,Paternal Grandparents. Social History Current every day smoker, Marital Status - Single, Alcohol Use - Never, Drug Use - No History, Caffeine Use - Daily. 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 Vitals Time Taken: 8:46 AM, Height: 66 in, Weight: 339 lbs, BMI: 54.7, Temperature: 98.4 F, Pulse: 85 bpm, Respiratory Rate: 18 breaths/min, Blood Pressure: 127/75 mmHg. Cardiovascular 2+ dorsalis pedis/posterior tibialis pulses. Psychiatric pleasant and cooperative. General Notes: Left foot: T the posterior ankle there is an open wound with granulation tissue and slough. No surrounding signs of infection. o Integumentary (Hair, Skin) Wound #1 status is Open. Original cause of wound was Other Lesion. The date acquired was: 10/06/2021. The wound has been  in treatment 10 weeks. The wound is located on the Left Calcaneus. The wound measures 1.5cm length x 1cm width x 0.1cm depth; 1.178cm^2 area and 0.118cm^3 volume. There is Fat Layer (Subcutaneous Tissue) exposed. There is no tunneling or undermining noted. There is a medium amount of serosanguineous drainage noted. The wound margin is distinct with the outline attached to the wound base. There is large (67-100%) red, pink granulation within the wound bed. There is no necrotic tissue within the wound bed. Assessment Active Problems ICD-10 Non-pressure chronic ulcer of other part of left foot with fat layer exposed Type 2 diabetes mellitus with foot ulcer Morbid (severe) obesity due to excess calories Patient's wound has shown improvement in size and appearance since last clinic visit. I debrided nonviable tissue. Epicord #1 was placed in standard fashion today. We will continue 3 layer compression. She will follow-up weekly. Procedures Wound #1 Pre-procedure diagnosis of Wound #1 is a Diabetic Wound/Ulcer of the Lower Extremity located on the Left Calcaneus .Severity of Tissue Pre Debridement is: Fat layer exposed. There was a Excisional Skin/Subcutaneous Tissue Debridement with a total area of 1.5 sq cm performed by Geralyn Corwin, DO. With the following instrument(s): Curette to remove Non-Viable tissue/material. Material removed includes Subcutaneous Tissue and Slough and after achieving pain control using Lidocaine 4% T opical Solution. No specimens were taken. A time out was conducted at 09:32, prior to the start of the procedure. A Minimum amount of bleeding was controlled with Pressure. The procedure was tolerated well. Post Debridement Measurements: 1.5cm length x 1cm width x 0.1cm depth; 0.118cm^3 volume. Character of Wound/Ulcer Post Debridement is stable. Severity of Tissue Post Debridement is: Fat layer exposed. Post procedure Diagnosis Wound #1: Same as  Pre-Procedure Pre-procedure diagnosis of Wound #1 is a Diabetic Wound/Ulcer of the Lower Extremity located on the Left Calcaneus. A skin graft procedure using a bioengineered skin substitute/cellular or tissue based product was performed by Geralyn Corwin, DO with the following instrument(s): Scissors. Epicord was applied and secured with Steri-Strips. 4.5 sq cm of product was utilized and 1.5 sq cm was wasted due to wound size. Post Application, Adaptic, ABD was applied. A Time Out was conducted at 09:33, prior to the start of the procedure. Post procedure Diagnosis Wound #1: Same as Pre-Procedure . Pre-procedure diagnosis of Wound #1 is a Diabetic Wound/Ulcer of the Lower Extremity located on the Left Calcaneus . There was a Three Layer Compression Therapy Procedure by Antonieta Iba, RN. Post procedure Diagnosis Wound #1: Same as Pre-Procedure Plan Follow-up Appointments: Return Appointment in 1 week. - Friday 05/22/22 @ 9:30am Dr. Mikey Bussing and Lennox Laity (Room # 7) Anesthetic: (In clinic) Topical Lidocaine 5% applied to wound bed (In clinic) Topical Lidocaine 4% applied to wound bed Cellular or Tissue  Based Products: Cellular or Tissue Based Product Type: - Run IVR for EpiFix and Epicord=100% covered 05/07/22: Epicord ordered Cellular or Tissue Based Product applied to wound bed, secured with steri-strips, cover with Adaptic or Mepitel. (DO NOT REMOVE). - Epicord #1 05/15/25 Bathing/ Shower/ Hygiene: May shower with protection but do not get wound dressing(s) wet. - May use cast protector bag from [facility], [facility] or [employer] Edema Control - Lymphedema / SCD / Other: Elevate legs to the level of the heart or above for 30 minutes daily and/or when sitting, a frequency of: Avoid standing for long periods of time. Additional Orders / Instructions: Follow Nutritious Diet - Monitor/Control Blood Sugar Home Health: New wound care orders this week; continue Home Health for wound care. May utilize  formulary equivalent dressing for wound treatment orders unless otherwise specified. - No HH dressing change this week, Skin sub applied Other Home Health Orders/Instructions: - [facility] WOUND #1: - Calcaneus Wound Laterality: Left Cleanser: Soap and Water (Home Health) 2 x Per Week/30 Days Discharge Instructions: May shower and wash wound with dial antibacterial soap and water prior to dressing change. Cleanser: Wound Cleanser 2 x Per Week/30 Days Discharge Instructions: Cleanse the wound with wound cleanser prior to applying a clean dressing using gauze sponges, not tissue or cotton balls. Peri-Wound Care: Triamcinolone 15 (g) 2 x Per Week/30 Days Discharge Instructions: Use triamcinolone 15 (g) as directed Peri-Wound Care: Sween Lotion (Moisturizing lotion) (Home Health) 2 x Per Week/30 Days Discharge Instructions: Apply moisturizing lotion as directed Secondary Dressing: ADAPTIC TOUCH 3x4.25 in 2 x Per Week/30 Days Discharge Instructions: Apply over primary dressing as directed. Secondary Dressing: ABD Pad, 8x10 (Home Health) 2 x Per Week/30 Days Discharge Instructions: Apply over primary dressing as directed. Compression Wrap: ThreePress (3 layer compression wrap) (Home Health) 2 x Per Week/30 Days Discharge Instructions: Apply three layer compression as directed. 1. In office sharp debridement 2. Epicord #1 placed in standard fashion 3. 3 layer compression 4. Follow-up in 1 week Electronic Signature(s) Signed: 05/15/2022 1:41:40 PM By: Geralyn CorwinHoffman, Jolee Critcher DO Entered By: Geralyn CorwinHoffman, Eloni Darius on 05/15/2022 10:50:04 -------------------------------------------------------------------------------- HxROS Details Patient Name: Date of Service: Lavell IslamHUFFMA N, [name]. 05/15/2022 8:45 A M Medical Record Number: [id number] Patient Account Number: [id number] Date of Birth/Sex: Treating RN: 06/07/1961 (61 y.o. [name]) [name], [name] Primary Care Provider: Junious Dresserampbell, Stephen Other Clinician: Referring  Provider: Treating Provider/Extender: Andree MoroHoffman, Rooney Gladwin Campbell, Stephen Weeks in Treatment: 10 Information Obtained From Patient Chart Hematologic/Lymphatic Medical History: Positive for: Anemia Respiratory Medical History: Positive for: Chronic Obstructive Pulmonary Disease (COPD) Cardiovascular Medical History: Positive for: Congestive Heart Failure - Weighs self daily; Hypertension; Myocardial Infarction; Peripheral Venous Disease Gastrointestinal Medical History: Past Medical History Notes: GERD, Peptic Ulcer Disease Endocrine Medical History: Positive for: Type II Diabetes Time with diabetes: 16 years Treated with: Insulin, Oral agents Blood sugar tested every day: Yes Tested : Twice daily Musculoskeletal Medical History: Past Medical History Notes: Broke left leg 3 years ago, 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, foot fused Neurologic Medical History: Positive for: Neuropathy Psychiatric Medical History: Past Medical History Notes: Depression Immunizations Pneumococcal Vaccine: Received Pneumococcal Vaccination: No Implantable Devices None Family and Social History Cancer: Yes - Father,Mother; Diabetes: Yes - Mother,Maternal Grandparents,Paternal Grandparents; Heart Disease: Yes - Siblings,Maternal Grandparents; Hypertension: Yes - Siblings; Tuberculosis: Yes - Maternal Grandparents,Paternal Grandparents; Current every day smoker; Marital Status - Single; Alcohol Use: Never; Drug Use: No History; Caffeine Use: Daily; Financial Concerns: No; Food, Clothing or Shelter Needs: No; Support System Lacking: No; Transportation Concerns: No  Electronic Signature(s) Signed: 05/15/2022 12:27:06 PM By: Antonieta Iba Signed: 05/15/2022 1:41:40 PM By: Geralyn Corwin DO Entered By: Geralyn Corwin on 05/15/2022 10:25:06 -------------------------------------------------------------------------------- SuperBill Details Patient Name: Date of Service: [name].  05/15/2022 Medical Record Number: [id number] Patient Account Number: [id number] Date of Birth/Sex: Treating RN: 11-Oct-1960 (61 y.o. [name] Primary Care Provider: [name] Other Clinician: Referring Provider: Treating Provider/Extender: [name] in Treatment: 10 Diagnosis Coding ICD-10 Codes Code Description [id number] Non-pressure chronic ulcer of other part of left foot with fat layer exposed E11.621 Type 2 diabetes mellitus with foot ulcer E66.01 Morbid (severe) obesity due to excess calories Facility Procedures CPT4 Code: [id number] Description: Q4187 [facility] 2cm x 3cm - per sqcm ICD-10 Diagnosis Description L97.522 Non-pressure chronic ulcer of other part of left foot with fat layer exposed E11.621 Type 2 diabetes mellitus with foot ulcer Modifier: Quantity: 6 CPT4 Code: [id number] 1 Description: 5275 - SKIN SUB GRAFT FACE/NK/HF/G ICD-10 Diagnosis Description L97.522 Non-pressure chronic ulcer of other part of left foot with fat layer exposed E11.621 Type 2 diabetes mellitus with foot ulcer Modifier: Quantity: 1 Physician Procedures : CPT4 Code Description Modifier [id number] - WC PHYS SKIN SUB GRAFT FACE/NK/HF/G ICD-10 Diagnosis Description L97.522 Non-pressure chronic ulcer of other part of left foot with fat layer exposed E11.621 Type 2 diabetes mellitus with foot ulcer Quantity: 1 Electronic Signature(s) Signed: 05/15/2022 1:41:40 PM By: Geralyn Corwin DO Entered By: Geralyn Corwin on 05/15/2022 10:52:27

## 2022-05-22 ENCOUNTER — Encounter (HOSPITAL_BASED_OUTPATIENT_CLINIC_OR_DEPARTMENT_OTHER): Payer: 59 | Admitting: Internal Medicine

## 2022-05-22 DIAGNOSIS — E11621 Type 2 diabetes mellitus with foot ulcer: Secondary | ICD-10-CM | POA: Diagnosis not present

## 2022-05-22 DIAGNOSIS — L97522 Non-pressure chronic ulcer of other part of left foot with fat layer exposed: Secondary | ICD-10-CM

## 2022-05-22 NOTE — Progress Notes (Signed)
Kalb, Laconda C. ([id number]) Visit Report for 05/22/2022 Chief Complaint Document Details Patient Name: Date of Service: [name] C. 05/22/2022 9:30 A M Medical Record Number: [id number] Patient Account Number: [id number] Date of Birth/Sex: Treating RN: [birth year] (61 y.o. [name] Primary Care Provider: [name] Other Clinician: Referring Provider: Treating Provider/Extender: [name] in Treatment: 11 Information Obtained from: Patient Chief Complaint 03/05/2022; left foot wound Electronic Signature(s) Signed: 05/22/2022 10:43:24 AM By: Geralyn Corwin DO Entered By: Geralyn Corwin on 05/22/2022 10:36:42 -------------------------------------------------------------------------------- Cellular or Tissue Based Product Details Patient Name: Date of Service: HUFFMA [name], [name]. 05/22/2022 9:30 A M Medical Record Number: [id number] Patient Account Number: [id number] Date of Birth/Sex: Treating RN: September 10, 1961 (61 y.o. [name] Primary Care Provider: [name] Other Clinician: Referring Provider: Treating Provider/Extender: [name] in Treatment: 11 Cellular or Tissue Based Product Type Wound #1 Left Calcaneus Applied to: Performed By: Physician Geralyn Corwin, DO Cellular or Tissue Based Product Type: Epicord Level of Consciousness (Pre-procedure): Awake and Alert Pre-procedure Verification/Time Out Yes - 10:11 Taken: Location: genitalia / hands / feet / multiple digits Wound Size (sq cm): 1.4 Product Size (sq cm): 6 Waste Size (sq cm): 1.75 Amount of Product Applied (sq cm): 4.25 Instrument Used: Forceps, Scissors Lot #: [id number] Expiration Date: 10/08/2026 Reconstituted: Yes Solution Type: Normal Saline Solution Amount: 1ml Lot #: [id number] Solution Expiration Date: 07/05/2022 Secured: Yes Secured With: Steri-Strips Dressing Applied: Yes Primary Dressing:  Adaptic Level of Consciousness (Post- Awake and Alert procedure): Post Procedure Diagnosis Same as Pre-procedure Electronic Signature(s) Signed: 05/22/2022 10:43:24 AM By: Geralyn Corwin DO Signed: 05/22/2022 1:15:54 PM By: Antonieta Iba Entered By: Antonieta Iba on 05/22/2022 10:21:07 -------------------------------------------------------------------------------- Debridement Details Patient Name: Date of Service: [name] C. 05/22/2022 9:30 A M Medical Record Number: [id number] Patient Account Number: [id number] Date of Birth/Sex: Treating RN: 01/03/1961 (61 y.o. [name] Primary Care Provider: [name] Other Clinician: Referring Provider: Treating Provider/Extender: [name] in Treatment: 11 Debridement Performed for Assessment: Wound #1 Left Calcaneus Performed By: Physician Geralyn Corwin, DO Debridement Type: Debridement Severity of Tissue Pre Debridement: Fat layer exposed Level of Consciousness (Pre-procedure): Awake and Alert Pre-procedure Verification/Time Out Yes - 10:10 Taken: Start Time: 10:11 Pain Control: Lidocaine 4% T opical Solution T Area Debrided (L x W): otal 1.4 (cm) x 1 (cm) = 1.4 (cm) Tissue and other material debrided: Non-Viable, Slough, Subcutaneous, Slough Level: Skin/Subcutaneous Tissue Debridement Description: Excisional Instrument: Curette Bleeding: Minimum Hemostasis Achieved: Pressure End Time: 10:15 Response to Treatment: Procedure was tolerated well Level of Consciousness (Post- Awake and Alert procedure): Post Debridement Measurements of Total Wound Length: (cm) 1.4 Width: (cm) 1 Depth: (cm) 0.1 Volume: (cm) 0.11 Character of Wound/Ulcer Post Debridement: Stable Severity of Tissue Post Debridement: Fat layer exposed Post Procedure Diagnosis Same as Pre-procedure Electronic Signature(s) Signed: 05/22/2022 10:43:24 AM By: Geralyn Corwin DO Signed: 05/22/2022 1:15:54 PM  By: Antonieta Iba Entered By: Antonieta Iba on 05/22/2022 10:15:45 -------------------------------------------------------------------------------- HPI Details Patient Name: Date of Service: [name] C. 05/22/2022 9:30 A M Medical Record Number: [id number] Patient Account Number: [id number] Date of Birth/Sex: Treating RN: Dec 13, 1960 (61 y.o. [name] Primary Care Provider: [name] Other Clinician: Referring Provider: Treating Provider/Extender: [name] in Treatment: 11 History of Present Illness HPI Description: Admission 03/05/2022 [name] is a 61 year old female with a past medical history of uncontrolled insulin-dependent type 2 diabetes, tobacco  user and chronic diastolic heart failure that presents to the clinic for a 10-month history of wound to her left heel. She states she had a left ankle fusion in September 2022. She states that she always had a wound after the surgery and it never healed. She has home health and they have been doing compression wraps along with silver alginate to the wound bed. She currently denies signs of infection. 6/8; this is a 61 year old woman with type 2 diabetes. She developed a wound on her left Achilles heel just above the tip of the heel in the setting of recurrent ankle surgeries in late 2022. I have seen some of these results from either the cast or the surgical boots that are put on after these operations. She thinks this may be the case. And a sense of pressure ulcer. In any case that she has been using Santyl Hydrofera Blue under compression. She is not wearing any footwear at home as she cannot find anything to accommodate the wrap 6/22; patient presents for follow-up. She has home health that comes out once a week to help with dressing changes. She has no issues or complaints today. We have been using Hydrofera Blue under compression therapy. 7/6; patient presents for follow-up.  She states that home health did not come out for the past 2 weeks. It is unclear why. We have been using Hydrofera Blue and Santyl under compression therapy. 7/13; patient presents for follow-up. She did not take the oral antibiotics prescribed at last clinic visit. We have been using Hydrofera Blue with gentamicin/mupirocin ointment under compression therapy. She has no issues or complaints today. She denies signs of infection. 7/20; patient presents for follow-up. We have been using Hydrofera Blue with antibiotic ointment under 3 layer compression. She has home health that change the dressing once. They put collagen on the wound bed. She also reports falling yesterday and hitting her right foot. She has no pain to the area today. 7/27; patient presents for follow-up. We have been using Hydrofera Blue under 3 layer compression. She has home health that changes the dressing. She has no issues or complaints today. 8/3; patient presents for follow-up. We continues to use Hydrofera Blue under 3 layer compression. She has no issues or complaints today. She has been approved for Epicord at 100%. 8/11; patient presents for follow-up. We have been using Hydrofera Blue under 3 layer compression. She has been approved for Epicord and we have this today. She is in agreement with having this applied. 8/18; patient presents for follow-up. Epicord #1 was placed in standard fashion last clinic visit. She has no issues or complaints today. Electronic Signature(s) Signed: 05/22/2022 10:43:24 AM By: Geralyn Corwin DO Entered By: Geralyn Corwin on 05/22/2022 10:37:33 -------------------------------------------------------------------------------- Physical Exam Details Patient Name: Date of Service: Sharol Harness MY C. 05/22/2022 9:30 A M Medical Record Number: [id number] Patient Account Number: [id number] Date of Birth/Sex: Treating RN: 05-Mar-1961 (61 y.o. [name] Primary Care Provider: [name] Other Clinician: Referring Provider: Treating Provider/Extender: [name] in Treatment: 11 Constitutional respirations regular, non-labored and within target range for patient.. Cardiovascular 2+ dorsalis pedis/posterior tibialis pulses. Psychiatric pleasant and cooperative. Notes Left foot: T the posterior ankle there is an open wound with granulation tissue and slough. No surrounding signs of infection. o Electronic Signature(s) Signed: 05/22/2022 10:43:24 AM By: Geralyn Corwin DO Entered By: Geralyn Corwin on 05/22/2022 10:40:04 -------------------------------------------------------------------------------- Physician Orders Details Patient Name: Date of Service: [name], [name]  C. 05/22/2022 9:30 A M Medical Record Number: [id number] Patient Account Number: [id number] Date of Birth/Sex: Treating RN: 03/15/1961 (61 y.o. [name] Primary Care Provider: [name] Other Clinician: Referring Provider: Treating Provider/Extender: [name] in Treatment: 11 Verbal / Phone Orders: No Diagnosis Coding Follow-up Appointments ppointment in 1 week. - Friday 05/29/22 @ 9:30am Dr. Mikey Bussing and Lennox Laity (Room # 7) Return A Anesthetic (In clinic) Topical Lidocaine 5% applied to wound bed (In clinic) Topical Lidocaine 4% applied to wound bed Cellular or Tissue Based Products Cellular or Tissue Based Product Type: - Run IVR for EpiFix and Epicord=100% covered 05/07/22: Epicord ordered daptic or Mepitel. (DO NOT REMOVE). - Cellular or Tissue Based Product applied to wound bed, secured with steri-strips, cover with A Epicord #1 05/15/25, Epicord #2 05/22/22 Bathing/ Shower/ Hygiene May shower with protection but do not get wound dressing(s) wet. - May use cast protector bag from [facility], [facility] or [employer] Edema Control - Lymphedema / SCD / Other Elevate legs to the level of the heart or above for 30 minutes  daily and/or when sitting, a frequency of: Avoid standing for long periods of time. Additional Orders / Instructions Follow Nutritious Diet - Monitor/Control Blood Sugar Home Health New wound care orders this week; continue Home Health for wound care. May utilize formulary equivalent dressing for wound treatment orders unless otherwise specified. - No HH dressing change this week, Skin sub applied Other Home Health Orders/Instructions: - [facility] Wound Treatment Wound #1 - Calcaneus Wound Laterality: Left Cleanser: Soap and Water (Home Health) 2 x Per Week/30 Days Discharge Instructions: May shower and wash wound with dial antibacterial soap and water prior to dressing change. Cleanser: Wound Cleanser 2 x Per Week/30 Days Discharge Instructions: Cleanse the wound with wound cleanser prior to applying a clean dressing using gauze sponges, not tissue or cotton balls. Peri-Wound Care: Triamcinolone 15 (g) 2 x Per Week/30 Days Discharge Instructions: Use triamcinolone 15 (g) as directed Peri-Wound Care: Sween Lotion (Moisturizing lotion) (Home Health) 2 x Per Week/30 Days Discharge Instructions: Apply moisturizing lotion as directed Secondary Dressing: ADAPTIC TOUCH 3x4.25 in 2 x Per Week/30 Days Discharge Instructions: Apply over primary dressing as directed. Secondary Dressing: ABD Pad, 8x10 (Home Health) 2 x Per Week/30 Days Discharge Instructions: Apply over primary dressing as directed. Compression Wrap: ThreePress (3 layer compression wrap) (Home Health) 2 x Per Week/30 Days Discharge Instructions: Apply three layer compression as directed. Electronic Signature(s) Signed: 05/22/2022 10:43:24 AM By: Geralyn Corwin DO Entered By: Geralyn Corwin on 05/22/2022 10:40:13 -------------------------------------------------------------------------------- Problem List Details Patient Name: Date of Service: [name] C. 05/22/2022 9:30 A M Medical Record Number: [id number] Patient  Account Number: [id number] Date of Birth/Sex: Treating RN: Apr 07, 1961 (61 y.o. [name] Primary Care Provider: [name] Other Clinician: Referring Provider: Treating Provider/Extender: [name] in Treatment: 11 Active Problems ICD-10 Encounter Code Description Active Date MDM Diagnosis [phone] Non-pressure chronic ulcer of other part of left foot with fat layer exposed 03/05/2022 No Yes E11.621 Type 2 diabetes mellitus with foot ulcer 03/05/2022 No Yes E66.01 Morbid (severe) obesity due to excess calories 03/05/2022 No Yes Inactive Problems Resolved Problems Electronic Signature(s) Signed: 05/22/2022 10:43:24 AM By: Geralyn Corwin DO Entered By: Geralyn Corwin on 05/22/2022 10:36:27 -------------------------------------------------------------------------------- Progress Note Details Patient Name: Date of Service: [name] C. 05/22/2022 9:30 A M Medical Record Number: [id number] Patient Account Number: [id number] Date of Birth/Sex: Treating RN: 1961/01/19 (61 y.o. F) Marcell Barlow,  Lennox Laity Primary Care Provider: [name] Other Clinician: Referring Provider: Treating Provider/Extender: [name] in Treatment: 11 Subjective Chief Complaint Information obtained from Patient 03/05/2022; left foot wound History of Present Illness (HPI) Admission 03/05/2022 Ms. Christell Mckeough is a 62 year old female with a past medical history of uncontrolled insulin-dependent type 2 diabetes, tobacco user and chronic diastolic heart failure that presents to the clinic for a 17-month history of wound to her left heel. She states she had a left ankle fusion in September 2022. She states that she always had a wound after the surgery and it never healed. She has home health and they have been doing compression wraps along with silver alginate to the wound bed. She currently denies signs of infection. 6/8; this is a 61 year old  woman with type 2 diabetes. She developed a wound on her left Achilles heel just above the tip of the heel in the setting of recurrent ankle surgeries in late 2022. I have seen some of these results from either the cast or the surgical boots that are put on after these operations. She thinks this may be the case. And a sense of pressure ulcer. In any case that she has been using Santyl Hydrofera Blue under compression. She is not wearing any footwear at home as she cannot find anything to accommodate the wrap 6/22; patient presents for follow-up. She has home health that comes out once a week to help with dressing changes. She has no issues or complaints today. We have been using Hydrofera Blue under compression therapy. 7/6; patient presents for follow-up. She states that home health did not come out for the past 2 weeks. It is unclear why. We have been using Hydrofera Blue and Santyl under compression therapy. 7/13; patient presents for follow-up. She did not take the oral antibiotics prescribed at last clinic visit. We have been using Hydrofera Blue with gentamicin/mupirocin ointment under compression therapy. She has no issues or complaints today. She denies signs of infection. 7/20; patient presents for follow-up. We have been using Hydrofera Blue with antibiotic ointment under 3 layer compression. She has home health that change the dressing once. They put collagen on the wound bed. She also reports falling yesterday and hitting her right foot. She has no pain to the area today. 7/27; patient presents for follow-up. We have been using Hydrofera Blue under 3 layer compression. She has home health that changes the dressing. She has no issues or complaints today. 8/3; patient presents for follow-up. We continues to use Hydrofera Blue under 3 layer compression. She has no issues or complaints today. She has been approved for Epicord at 100%. 8/11; patient presents for follow-up. We have been using  Hydrofera Blue under 3 layer compression. She has been approved for Epicord and we have this today. She is in agreement with having this applied. 8/18; patient presents for follow-up. Epicord #1 was placed in standard fashion last clinic visit. She has no issues or complaints today. Patient History Information obtained from Patient, Chart. Family History Cancer - Father,Mother, Diabetes - Mother,Maternal Grandparents,Paternal Grandparents, Heart Disease - Siblings,Maternal Grandparents, Hypertension - Siblings, Tuberculosis - Maternal Grandparents,Paternal Grandparents. Social History Current every day smoker, Marital Status - Single, Alcohol Use - Never, Drug Use - No History, Caffeine Use - Daily. 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 Vitals Time Taken: 9:54 AM, Height: 66 in, Weight: 339 lbs, BMI: 54.7, Temperature: 98.7 F, Pulse: 82 bpm, Respiratory Rate: 18 breaths/min, Blood Pressure: 133/69 mmHg. Cardiovascular 2+ dorsalis pedis/posterior tibialis pulses. Psychiatric pleasant and cooperative. General Notes: Left foot: T the posterior ankle there is an open wound with granulation tissue and slough. No surrounding signs of infection. o Integumentary (Hair, Skin) Wound #1 status is Open. Original cause of wound was Other Lesion. The date acquired was:  10/06/2021. The wound has been in treatment 11 weeks. The wound is located on the Left Calcaneus. The wound measures 1.4cm length x 1cm width x 0.1cm depth; 1.1cm^2 area and 0.11cm^3 volume. There is Fat Layer (Subcutaneous Tissue) exposed. There is no tunneling or undermining noted. There is a medium amount of serosanguineous drainage noted. The wound margin is distinct with the outline attached to the wound base. There is large (67-100%) red, pink granulation within the wound bed. There is no necrotic tissue within the wound bed. Assessment Active Problems ICD-10 Non-pressure chronic ulcer of other part of left foot with fat layer exposed Type 2 diabetes mellitus with foot ulcer Morbid (severe) obesity due to excess calories Patient's wound has shown improvement in size and appearance since last clinic visit. I debrided nonviable tissue. I recommended continuing with epicord and this was placed in standard fashion today. We will continue compression therapy. Follow-up in 1 week. Procedures Wound #1 Pre-procedure diagnosis of Wound #1 is a Diabetic Wound/Ulcer of the Lower Extremity located on the Left Calcaneus .Severity of Tissue Pre Debridement is: Fat layer exposed. There was a Excisional Skin/Subcutaneous Tissue Debridement with a total area of 1.4 sq cm performed by Geralyn Corwin, DO. With the following instrument(s): Curette to remove Non-Viable tissue/material. Material removed includes Subcutaneous Tissue and Slough and after achieving pain control using Lidocaine 4% T opical Solution. No specimens were taken. A time out was conducted at 10:10, prior to the start of the procedure. A Minimum amount of bleeding was controlled with Pressure. The procedure was tolerated well. Post Debridement Measurements: 1.4cm length x 1cm width x 0.1cm depth; 0.11cm^3 volume. Character of Wound/Ulcer Post Debridement is stable. Severity of Tissue Post Debridement is: Fat layer exposed. Post procedure  Diagnosis Wound #1: Same as Pre-Procedure Pre-procedure diagnosis of Wound #1 is a Diabetic Wound/Ulcer of the Lower Extremity located on the Left Calcaneus. A skin graft procedure using a bioengineered skin substitute/cellular or tissue based product was performed by Geralyn Corwin, DO with the following instrument(s): Forceps and Scissors. Epicord was applied and secured with Steri-Strips. 4.25 sq cm of product was utilized and 1.75 sq cm was wasted. Post Application, Adaptic was applied. A Time Out was conducted at 10:11, prior to the start of the procedure. Post procedure Diagnosis Wound #1: Same as Pre-Procedure . Plan Follow-up Appointments: Return Appointment in 1 week. - Friday 05/29/22 @ 9:30am Dr. Mikey Bussing and Lennox Laity (Room # 7) Anesthetic: (In clinic) Topical Lidocaine 5% applied to wound bed (In clinic) Topical Lidocaine 4% applied to wound bed Cellular or Tissue Based Products: Cellular or Tissue Based Product Type: - Run IVR for EpiFix and Epicord=100% covered 05/07/22: Epicord ordered Cellular or Tissue Based Product applied to wound bed, secured  with steri-strips, cover with Adaptic or Mepitel. (DO NOT REMOVE). - Epicord #1 05/15/25, Epicord #2 05/22/22 Bathing/ Shower/ Hygiene: May shower with protection but do not get wound dressing(s) wet. - May use cast protector bag from [facility], [facility] or [employer] Edema Control - Lymphedema / SCD / Other: Elevate legs to the level of the heart or above for 30 minutes daily and/or when sitting, a frequency of: Avoid standing for long periods of time. Additional Orders / Instructions: Follow Nutritious Diet - Monitor/Control Blood Sugar Home Health: New wound care orders this week; continue Home Health for wound care. May utilize formulary equivalent dressing for wound treatment orders unless otherwise specified. - No HH dressing change this week, Skin sub applied Other Home Health Orders/Instructions: - [facility] WOUND #1: - Calcaneus Wound  Laterality: Left Cleanser: Soap and Water (Home Health) 2 x Per Week/30 Days Discharge Instructions: May shower and wash wound with dial antibacterial soap and water prior to dressing change. Cleanser: Wound Cleanser 2 x Per Week/30 Days Discharge Instructions: Cleanse the wound with wound cleanser prior to applying a clean dressing using gauze sponges, not tissue or cotton balls. Peri-Wound Care: Triamcinolone 15 (g) 2 x Per Week/30 Days Discharge Instructions: Use triamcinolone 15 (g) as directed Peri-Wound Care: Sween Lotion (Moisturizing lotion) (Home Health) 2 x Per Week/30 Days Discharge Instructions: Apply moisturizing lotion as directed Secondary Dressing: ADAPTIC TOUCH 3x4.25 in 2 x Per Week/30 Days Discharge Instructions: Apply over primary dressing as directed. Secondary Dressing: ABD Pad, 8x10 (Home Health) 2 x Per Week/30 Days Discharge Instructions: Apply over primary dressing as directed. Com pression Wrap: ThreePress (3 layer compression wrap) (Home Health) 2 x Per Week/30 Days Discharge Instructions: Apply three layer compression as directed. 1. In office sharp debridement 2. Epicord #2 placed in standard fashion 3. Follow-up in 1 week 4. 3 layer compression Electronic Signature(s) Signed: 05/22/2022 10:43:24 AM By: Geralyn Corwin DO Entered By: Geralyn Corwin on 05/22/2022 10:42:22 -------------------------------------------------------------------------------- HxROS Details Patient Name: Date of Service: [name], [name]. 05/22/2022 9:30 A M Medical Record Number: [id number] Patient Account Number: [id number] Date of Birth/Sex: Treating RN: 1961/07/28 (60 y.o. [name] Primary Care Provider: [name] Other Clinician: Referring Provider: Treating Provider/Extender: [name] in Treatment: 11 Information Obtained From Patient Chart Hematologic/Lymphatic Medical History: Positive for:  Anemia Respiratory Medical History: Positive for: Chronic Obstructive Pulmonary Disease (COPD) Cardiovascular Medical History: Positive for: Congestive Heart Failure - Weighs self daily; Hypertension; Myocardial Infarction; Peripheral Venous Disease Gastrointestinal Medical History: Past Medical History Notes: GERD, Peptic Ulcer Disease Endocrine Medical History: Positive for: Type II Diabetes Time with diabetes: 16 years Treated with: Insulin, Oral agents Blood sugar tested every day: Yes Tested : Twice daily Musculoskeletal Medical History: Past Medical History Notes: Broke left leg 3 years ago, 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, foot fused Neurologic Medical History: Positive for: Neuropathy Psychiatric Medical History: Past Medical History Notes: Depression Immunizations Pneumococcal Vaccine: Received Pneumococcal Vaccination: No Implantable Devices None Family and Social History Cancer: Yes - Father,Mother; Diabetes: Yes - Mother,Maternal Grandparents,Paternal Grandparents; Heart Disease: Yes - Siblings,Maternal Grandparents; Hypertension: Yes - Siblings; Tuberculosis: Yes - Maternal Grandparents,Paternal Grandparents; Current every day smoker; Marital Status - Single; Alcohol Use: Never; Drug Use: No History; Caffeine Use: Daily; Financial Concerns: No; Food, Clothing or Shelter Needs: No; Support System Lacking: No; Transportation Concerns: No Electronic Signature(s) Signed: 05/22/2022 10:43:24 AM By: Geralyn Corwin DO Signed: 05/22/2022 1:15:54 PM By: Antonieta Iba Entered By: Geralyn Corwin on 05/22/2022 10:37:42 --------------------------------------------------------------------------------  SuperBill Details Patient Name: Date of Service: Aundra DubinHUFFMA N, [name]. 05/22/2022 Medical Record Number: [id number] Patient Account Number: [id number] Date of Birth/Sex: Treating RN: June 11, 1961 (61 y.o. [name]) [name], [name] Primary Care Provider: Junious Dresserampbell, Stephen Other  Clinician: Referring Provider: Treating Provider/Extender: Andree MoroHoffman, Kaliah Haddaway Campbell, Stephen Weeks in Treatment: 11 Diagnosis Coding ICD-10 Codes Code Description [phone] Non-pressure chronic ulcer of other part of left foot with fat layer exposed E11.621 Type 2 diabetes mellitus with foot ulcer E66.01 Morbid (severe) obesity due to excess calories Facility Procedures CPT4 Code: [id number] Description: Q4187 Epicord 2cm x 3cm - per sqcm ICD-10 Diagnosis Description L97.522 Non-pressure chronic ulcer of other part of left foot with fat layer exposed Modifier: Quantity: 6 CPT4 Code: [id number] 1 Description: 5275 - SKIN SUB GRAFT FACE/NK/HF/G ICD-10 Diagnosis Description L97.522 Non-pressure chronic ulcer of other part of left foot with fat layer exposed Modifier: Quantity: 1 Physician Procedures : CPT4 Code Description Modifier [id number] 15275 - WC PHYS SKIN SUB GRAFT FACE/NK/HF/G ICD-10 Diagnosis Description L97.522 Non-pressure chronic ulcer of other part of left foot with fat layer exposed Quantity: 1 Electronic Signature(s) Signed: 05/22/2022 10:43:24 AM By: Geralyn CorwinHoffman, Haylyn Halberg DO Entered By: Geralyn CorwinHoffman, Riannon Mukherjee on 05/22/2022 10:42:42

## 2022-05-22 NOTE — Progress Notes (Signed)
[name], [name]. ([id number]) Visit Report for 05/22/2022 Arrival Information Details Patient Name: Date of Service: [name] 05/22/2022 9:30 A M Medical Record Number: [id number] Patient Account Number: [id number] Date of Birth/Sex: Treating RN: [birth year] (61 y.o. [name] Primary Care [name]: [name] Other Clinician: Referring [name]: Treating [name]/Extender: [name] in Treatment: 11 Visit Information History Since Last Visit Added or deleted any medications: No Patient Arrived: Wheel Chair Any new allergies or adverse reactions: No Arrival Time: 09:51 Had a fall or experienced change in No Accompanied By: Daughter activities of daily living that may affect Transfer Assistance: None risk of falls: Patient Identification Verified: Yes Signs or symptoms of abuse/neglect since last visito No Secondary Verification Process Completed: Yes Hospitalized since last visit: No Patient Requires Transmission-Based Precautions: No Implantable device outside of the clinic excluding No Patient Has Alerts: No cellular tissue based products placed in the center since last visit: Has Compression in Place as Prescribed: Yes Pain Present Now: Yes Electronic Signature(s) Signed: 05/22/2022 12:32:17 PM By: Erenest Blank Entered By: Erenest Blank on 05/22/2022 09:52:21 -------------------------------------------------------------------------------- Encounter Discharge Information Details Patient Name: Date of Service: [name] Lax C. 05/22/2022 9:30 A M Medical Record Number: [id number] Patient Account Number: [id number] Date of Birth/Sex: Treating RN: [name] (61 y.o. [name] Primary Care Ellamay Fors: [name] Other Clinician: Referring [name]: Treating [name]/Extender: [name] in Treatment: 11 Encounter Discharge Information Items Post Procedure Vitals Discharge  Condition: Stable Temperature (F): 98.7 Ambulatory Status: Wheelchair Pulse (bpm): 82 Discharge Destination: Home Respiratory Rate (breaths/min): 18 Transportation: Private Auto Blood Pressure (mmHg): 133/69 Accompanied By: Daughter Schedule Follow-up Appointment: Yes Clinical Summary of Care: Provided on 05/22/2022 Form Type Recipient Paper Patient Patient Electronic Signature(s) Signed: 05/22/2022 11:19:01 AM By: Lorrin Jackson Previous Signature: 05/22/2022 11:18:27 AM Version By: Lorrin Jackson Entered By: Lorrin Jackson on 05/22/2022 11:19:01 -------------------------------------------------------------------------------- Lower Extremity Assessment Details Patient Name: Date of Service: [name] Lax C. 05/22/2022 9:30 A M Medical Record Number: [id number] Patient Account Number: [id number] Date of Birth/Sex: Treating RN: [name] (61 y.o. [name] Primary Care Janissa Bertram: [name] Other Clinician: Referring Romaine [name]: Treating [name]/Extender: [name] in Treatment: 11 Edema Assessment Assessed: [name]: No] [name]: No] Edema: [Left: Ye] [Right: s] Calf Left: Right: Point of Measurement: 28 cm From Medial Instep 49 cm Ankle Left: Right: Point of Measurement: 3 cm From Medial Instep 34.5 cm Electronic Signature(s) Signed: 05/22/2022 12:32:17 PM By: Erenest Blank Signed: 05/22/2022 1:15:54 PM By: Lorrin Jackson Entered By: Erenest Blank on 05/22/2022 10:06:43 -------------------------------------------------------------------------------- Multi Wound Chart Details Patient Name: Date of Service: [name] Lax C. 05/22/2022 9:30 A M Medical Record Number: [id number] Patient Account Number: [id number] Date of Birth/Sex: Treating RN: [name] (61 y.o. [name] Primary Care Cristina Mattern: [name] Other Clinician: Referring [name]: Treating [name]/Extender: [name]  in Treatment: 11 Vital Signs Height(in): 71 Pulse(bpm): 41 Weight(lbs): 339 Blood Pressure(mmHg): 133/69 Body Mass Index(BMI): 54.7 Temperature(F): 98.7 Respiratory Rate(breaths/min): 18 Photos: [N/A:N/A] Left Calcaneus N/A N/A Wound Location: Other Lesion N/A N/A Wounding Event: Diabetic Wound/Ulcer of the Lower N/A N/A Primary Etiology: Extremity Anemia, Chronic Obstructive N/A N/A Comorbid History: Pulmonary Disease (COPD), Congestive Heart Failure, Hypertension, Myocardial Infarction, Peripheral Venous Disease, Type II Diabetes, Neuropathy 10/06/2021 N/A N/A Date Acquired: 11 N/A N/A Weeks of Treatment: Open N/A N/A Wound Status: No N/A N/A Wound Recurrence: 1.4x1x0.1 N/A N/A Measurements L x W x D (  cm) 1.1 N/A N/A A (cm) : rea 0.11 N/A N/A Volume (cm) : 75.00% N/A N/A % Reduction in A rea: 87.50% N/A N/A % Reduction in Volume: Grade 2 N/A N/A Classification: Medium N/A N/A Exudate A mount: Serosanguineous N/A N/A Exudate Type: red, brown N/A N/A Exudate Color: Distinct, outline attached N/A N/A Wound Margin: Large (67-100%) N/A N/A Granulation A mount: Red, Pink N/A N/A Granulation Quality: None Present (0%) N/A N/A Necrotic A mount: Fat Layer (Subcutaneous Tissue): Yes N/A N/A Exposed Structures: Fascia: No Tendon: No Muscle: No Joint: No Bone: No Medium (34-66%) N/A N/A Epithelialization: Debridement - Excisional N/A N/A Debridement: Pre-procedure Verification/Time Out 10:10 N/A N/A Taken: Lidocaine 4% Topical Solution N/A N/A Pain Control: Subcutaneous, Slough N/A N/A Tissue Debrided: Skin/Subcutaneous Tissue N/A N/A Level: 1.4 N/A N/A Debridement A (sq cm): rea Curette N/A N/A Instrument: Minimum N/A N/A Bleeding: Pressure N/A N/A Hemostasis A chieved: Procedure was tolerated well N/A N/A Debridement Treatment Response: 1.4x1x0.1 N/A N/A Post Debridement Measurements L x W x D (cm) 0.11 N/A N/A Post Debridement  Volume: (cm) Cellular or Tissue Based Product N/A N/A Procedures Performed: Debridement Treatment Notes Electronic Signature(s) Signed: 05/22/2022 10:43:24 AM By: Kalman Shan DO Signed: 05/22/2022 1:15:54 PM By: Lorrin Jackson Entered By: Kalman Shan on 05/22/2022 10:36:32 -------------------------------------------------------------------------------- Multi-Disciplinary Care Plan Details Patient Name: Date of Service: [name] Lax C. 05/22/2022 9:30 A M Medical Record Number: [id number] Patient Account Number: [id number] Date of Birth/Sex: Treating RN: [name] (61 y.o. [name] Primary Care Karina Nofsinger: [name] Other Clinician: Referring Sharmaine Bain: Treating Alyzza Andringa/Extender: [name] in Treatment: 11 Active Inactive Abuse / Safety / Falls / Self Care Management Nursing Diagnoses: History of Falls Goals: Patient will not experience any injury related to falls Date Initiated: 03/05/2022 Target Resolution Date: 05/28/2022 Goal Status: Active Interventions: Assess fall risk on admission and as needed Assess: immobility, friction, shearing, incontinence upon admission and as needed Assess impairment of mobility on admission and as needed per policy Notes: 01/12/80: Fall prevention ongoing, recent fall. Wound/Skin Impairment Nursing Diagnoses: Impaired tissue integrity Goals: Patient/caregiver will verbalize understanding of skin care regimen Date Initiated: 03/05/2022 Target Resolution Date: 05/28/2022 Goal Status: Active Ulcer/skin breakdown will have a volume reduction of 30% by week 4 Date Initiated: 03/05/2022 Date Inactivated: 04/30/2022 Target Resolution Date: 05/02/2022 Goal Status: Met Ulcer/skin breakdown will have a volume reduction of 50% by week 8 Date Initiated: 04/30/2022 Target Resolution Date: 05/28/2022 Goal Status: Active Interventions: Assess patient/caregiver ability to obtain necessary  supplies Assess patient/caregiver ability to perform ulcer/skin care regimen upon admission and as needed Assess ulceration(s) every visit Provide education on smoking Provide education on ulcer and skin care Treatment Activities: Topical wound management initiated : 03/05/2022 Notes: 04/30/22: Wound care regimen continues. Electronic Signature(s) Signed: 05/22/2022 1:15:54 PM By: Lorrin Jackson Entered By: Lorrin Jackson on 05/22/2022 10:23:36 -------------------------------------------------------------------------------- Pain Assessment Details Patient Name: Date of Service: [name] Lax C. 05/22/2022 9:30 A M Medical Record Number: [id number] Patient Account Number: [id number] Date of Birth/Sex: Treating RN: [birth year] (61 y.o. [name] Primary Care Adamae Ricklefs: [name] Other Clinician: Referring Azai Gaffin: Treating Kendrick Remigio/Extender: [name] in Treatment: 11 Active Problems Location of Pain Severity and Description of Pain Patient Has Paino Patient Unable to Respond Site Locations Rate the pain. Rate the pain. Current Pain Level: 7 Pain Management and Medication Current Pain Management: Electronic Signature(s) Signed: 05/22/2022 12:32:17 PM By: Erenest Blank Signed: 05/22/2022 1:15:54 PM By: Lorrin Jackson  Entered By: Erenest Blank on 05/22/2022 09:52:37 -------------------------------------------------------------------------------- Patient/Caregiver Education Details Patient Name: Date of Service: [name]. 8/18/2023andnbsp9:30 A M Medical Record Number: [id number] Patient Account Number: [id number] Date of Birth/Gender: Treating RN: [name] (61 y.o. [name] Primary Care Physician: [name] Other Clinician: Referring Physician: Treating Physician/Extender: [name] in Treatment: 11 Education Assessment Education Provided To: Patient Education Topics  Provided Smoking and Wound Healing: Methods: Explain/Verbal, Printed Responses: State content correctly Wound/Skin Impairment: Methods: Explain/Verbal, Printed Responses: State content correctly Electronic Signature(s) Signed: 05/22/2022 1:15:54 PM By: Lorrin Jackson Entered By: Lorrin Jackson on 05/22/2022 10:23:55 -------------------------------------------------------------------------------- Wound Assessment Details Patient Name: Date of Service: [name] Lax C. 05/22/2022 9:30 A M Medical Record Number: [id number] Patient Account Number: [id number] Date of Birth/Sex: Treating RN: [name] (61 y.o. [name] Primary Care Cori Justus: [name] Other Clinician: Referring Sharon Stapel: Treating Jeniffer Culliver/Extender: [name] in Treatment: 11 Wound Status Wound Number: 1 Primary Diabetic Wound/Ulcer of the Lower Extremity Etiology: Wound Location: Left Calcaneus Wound Open Wounding Event: Other Lesion Status: Date Acquired: 10/06/2021 Comorbid Anemia, Chronic Obstructive Pulmonary Disease (COPD), Weeks Of Treatment: 11 History: Congestive Heart Failure, Hypertension, Myocardial Infarction, Clustered Wound: No Peripheral Venous Disease, Type II Diabetes, Neuropathy Photos Wound Measurements Length: (cm) 1.4 Width: (cm) 1 Depth: (cm) 0.1 Area: (cm) 1.1 Volume: (cm) 0.11 % Reduction in Area: 75% % Reduction in Volume: 87.5% Epithelialization: Medium (34-66%) Tunneling: No Undermining: No Wound Description Classification: Grade 2 Wound Margin: Distinct, outline attached Exudate Amount: Medium Exudate Type: Serosanguineous Exudate Color: red, brown Foul Odor After Cleansing: No Slough/Fibrino Yes Wound Bed Granulation Amount: Large (67-100%) Exposed Structure Granulation Quality: Red, Pink Fascia Exposed: No Necrotic Amount: None Present (0%) Fat Layer (Subcutaneous Tissue) Exposed: Yes Tendon Exposed: No Muscle  Exposed: No Joint Exposed: No Bone Exposed: No Treatment Notes Wound #1 (Calcaneus) Wound Laterality: Left Cleanser Soap and Water Discharge Instruction: May shower and wash wound with dial antibacterial soap and water prior to dressing change. Wound Cleanser Discharge Instruction: Cleanse the wound with wound cleanser prior to applying a clean dressing using gauze sponges, not tissue or cotton balls. Peri-Wound Care Triamcinolone 15 (g) Discharge Instruction: Use triamcinolone 15 (g) as directed Sween Lotion (Moisturizing lotion) Discharge Instruction: Apply moisturizing lotion as directed Topical Primary Dressing Secondary Dressing ADAPTIC TOUCH 3x4.25 in Discharge Instruction: Apply over primary dressing as directed. ABD Pad, 8x10 Discharge Instruction: Apply over primary dressing as directed. Secured With Compression Wrap ThreePress (3 layer compression wrap) Discharge Instruction: Apply three layer compression as directed. Compression Stockings Add-Ons Electronic Signature(s) Signed: 05/22/2022 12:32:17 PM By: Erenest Blank Signed: 05/22/2022 1:15:54 PM By: Lorrin Jackson Entered By: Erenest Blank on 05/22/2022 10:08:33 -------------------------------------------------------------------------------- Vitals Details Patient Name: Date of Service: [name] Lax C. 05/22/2022 9:30 A M Medical Record Number: [id number] Patient Account Number: [id number] Date of Birth/Sex: Treating RN: [birth year] (61 y.o. [name] Primary Care Kieren Ricci: [name] Other Clinician: Referring Jettie Lazare: Treating Aveya Beal/Extender: [name] in Treatment: 11 Vital Signs Time Taken: 09:54 Temperature (F): 98.7 Height (in): 66 Pulse (bpm): 82 Weight (lbs): 339 Respiratory Rate (breaths/min): 18 Body Mass Index (BMI): 54.7 Blood Pressure (mmHg): 133/69 Reference Range: 80 - 120 mg / dl Electronic Signature(s) Signed: 05/22/2022 12:32:17  PM By: Erenest Blank Entered By: Erenest Blank on 05/22/2022 09:55:09

## 2022-05-29 ENCOUNTER — Encounter (HOSPITAL_BASED_OUTPATIENT_CLINIC_OR_DEPARTMENT_OTHER): Payer: 59 | Admitting: Internal Medicine

## 2022-06-22 ENCOUNTER — Encounter (HOSPITAL_BASED_OUTPATIENT_CLINIC_OR_DEPARTMENT_OTHER): Payer: 59 | Attending: Internal Medicine | Admitting: Internal Medicine

## 2022-06-22 DIAGNOSIS — Z6841 Body Mass Index (BMI) 40.0 and over, adult: Secondary | ICD-10-CM | POA: Insufficient documentation

## 2022-06-22 DIAGNOSIS — E11621 Type 2 diabetes mellitus with foot ulcer: Secondary | ICD-10-CM | POA: Diagnosis present

## 2022-06-22 DIAGNOSIS — F17200 Nicotine dependence, unspecified, uncomplicated: Secondary | ICD-10-CM | POA: Diagnosis not present

## 2022-06-22 DIAGNOSIS — Z794 Long term (current) use of insulin: Secondary | ICD-10-CM | POA: Insufficient documentation

## 2022-06-22 DIAGNOSIS — L97522 Non-pressure chronic ulcer of other part of left foot with fat layer exposed: Secondary | ICD-10-CM | POA: Diagnosis not present

## 2022-06-22 NOTE — Progress Notes (Signed)
Winkles, Hawa C. ([id number]) Visit Report for 06/22/2022 Arrival Information Details Patient Name: Date of Service: [name] 06/22/2022 11:00 A M Medical Record Number: [id number] Patient Account Number: [id number] Date of Birth/Sex: Treating RN: 03-26-1961 (61 y.o. [name], [name] Primary Care Jimena Wieczorek: Jenean Lindau Other Clinician: Referring Andru Genter: Treating Brendon Christoffel/Extender: Elise Benne in Treatment: 15 Visit Information History Since Last Visit Added or deleted any medications: No Patient Arrived: Wheel Chair Any new allergies or adverse reactions: No Arrival Time: 10:58 Had a fall or experienced change in No Accompanied By: cna activities of daily living that may affect Transfer Assistance: None risk of falls: Patient Identification Verified: Yes Signs or symptoms of abuse/neglect since last visito No Secondary Verification Process Completed: Yes Hospitalized since last visit: No Patient Requires Transmission-Based Precautions: No Implantable device outside of the clinic excluding No Patient Has Alerts: No cellular tissue based products placed in the center since last visit: Has Dressing in Place as Prescribed: Yes Has Compression in Place as Prescribed: Yes Pain Present Now: Yes Electronic Signature(s) Signed: 06/22/2022 3:56:14 PM By: Rhae Hammock RN Entered By: Rhae Hammock on 06/22/2022 10:59:56 -------------------------------------------------------------------------------- Compression Therapy Details Patient Name: Date of Service: [name] 06/22/2022 11:00 A M Medical Record Number: [id number] Patient Account Number: [id number] Date of Birth/Sex: Treating RN: [birth year] (61 y.o. [name], [name] Primary Care Kainan Patty: Jenean Lindau Other Clinician: Referring Erroll Wilbourne: Treating Tayona Sarnowski/Extender: Elise Benne in Treatment: 15 Compression Therapy Performed for  Wound Assessment: Wound #1 Left Calcaneus Performed By: Clinician Rhae Hammock, RN Compression Type: Three Layer Post Procedure Diagnosis Same as Pre-procedure Electronic Signature(s) Signed: 06/22/2022 3:56:14 PM By: Rhae Hammock RN Entered By: Rhae Hammock on 06/22/2022 11:29:22 -------------------------------------------------------------------------------- Encounter Discharge Information Details Patient Name: Date of Service: [name] 06/22/2022 11:00 A M Medical Record Number: [id number] Patient Account Number: [id number] Date of Birth/Sex: Treating RN: 12/22/1960 (61 y.o. [name], [name] Primary Care Terria Deschepper: Jenean Lindau Other Clinician: Referring Kenzlie Disch: Treating Chantella Creech/Extender: Elise Benne in Treatment: 15 Encounter Discharge Information Items Post Procedure Vitals Discharge Condition: Stable Temperature (F): 98.7 Ambulatory Status: Wheelchair Pulse (bpm): 74 Discharge Destination: [place] Respiratory Rate (breaths/min): 17 Telephoned: No Blood Pressure (mmHg): 134/74 Orders Sent: Yes Transportation: Private Auto Accompanied By: transporter Schedule Follow-up Appointment: Yes Clinical Summary of Care: Patient Declined Electronic Signature(s) Signed: 06/22/2022 3:56:14 PM By: Rhae Hammock RN Entered By: Rhae Hammock on 06/22/2022 11:40:59 -------------------------------------------------------------------------------- Lower Extremity Assessment Details Patient Name: Date of Service: [name] 06/22/2022 11:00 A M Medical Record Number: [id number] Patient Account Number: [id number] Date of Birth/Sex: Treating RN: Nov 22, 1960 (61 y.o. [name], [name] Primary Care Ryka Beighley: Jenean Lindau Other Clinician: Referring Kylar Speelman: Treating Tamirra Sienkiewicz/Extender: Elise Benne in Treatment: 15 Edema Assessment Assessed: Shirlyn Goltz: Yes]  Patrice Paradise: No] Edema: [Left: Ye] [Right: s] Calf Left: Right: Point of Measurement: 28 cm From Medial Instep 49 cm Ankle Left: Right: Point of Measurement: 3 cm From Medial Instep 34.5 cm Vascular Assessment Pulses: Dorsalis Pedis Palpable: [Left:Yes] Posterior Tibial Palpable: [Left:Yes] Electronic Signature(s) Signed: 06/22/2022 3:56:14 PM By: Rhae Hammock RN Entered By: Rhae Hammock on 06/22/2022 11:02:38 -------------------------------------------------------------------------------- Multi Wound Chart Details Patient Name: Date of Service: [name] 06/22/2022 11:00 A M Medical Record Number: [id number] Patient Account Number: [id number] Date of Birth/Sex: Treating RN: [birth year] (61 y.o. F) Primary Care Tiahna Cure: Jenean Lindau Other Clinician: Referring Anastassia Noack: Treating Sachi Boulay/Extender: Kai Levins,  Alexis Goodell in Treatment: 15 Vital Signs Height(in): 66 Pulse(bpm): 74 Weight(lbs): 339 Blood Pressure(mmHg): 148/74 Body Mass Index(BMI): 54.7 Temperature(F): 98.1 Respiratory Rate(breaths/min): 17 Photos: [N/A:N/A] Left Calcaneus N/A N/A Wound Location: Other Lesion N/A N/A Wounding Event: Diabetic Wound/Ulcer of the Lower N/A N/A Primary Etiology: Extremity Anemia, Chronic Obstructive N/A N/A Comorbid History: Pulmonary Disease (COPD), Congestive Heart Failure, Hypertension, Myocardial Infarction, Peripheral Venous Disease, Type II Diabetes, Neuropathy 10/06/2021 N/A N/A Date Acquired: 15 N/A N/A Weeks of Treatment: Open N/A N/A Wound Status: No N/A N/A Wound Recurrence: 1x1x0.2 N/A N/A Measurements L x W x D (cm) 0.785 N/A N/A A (cm) : rea 0.157 N/A N/A Volume (cm) : 82.20% N/A N/A % Reduction in A rea: 82.20% N/A N/A % Reduction in Volume: Grade 2 N/A N/A Classification: Medium N/A N/A Exudate A mount: Serosanguineous N/A N/A Exudate Type: red, brown N/A N/A Exudate Color: Distinct, outline  attached N/A N/A Wound Margin: Large (67-100%) N/A N/A Granulation A mount: Red, Pink N/A N/A Granulation Quality: None Present (0%) N/A N/A Necrotic A mount: Fat Layer (Subcutaneous Tissue): Yes N/A N/A Exposed Structures: Fascia: No Tendon: No Muscle: No Joint: No Bone: No Medium (34-66%) N/A N/A Epithelialization: Debridement - Excisional N/A N/A Debridement: Pre-procedure Verification/Time Out 11:25 N/A N/A Taken: Lidocaine N/A N/A Pain Control: Callus, Subcutaneous, Slough N/A N/A Tissue Debrided: Skin/Subcutaneous Tissue N/A N/A Level: 1 N/A N/A Debridement A (sq cm): rea Curette N/A N/A Instrument: Minimum N/A N/A Bleeding: Pressure N/A N/A Hemostasis A chieved: 0 N/A N/A Procedural Pain: 0 N/A N/A Post Procedural Pain: Procedure was tolerated well N/A N/A Debridement Treatment Response: 1x1x0.2 N/A N/A Post Debridement Measurements L x W x D (cm) 0.157 N/A N/A Post Debridement Volume: (cm) Compression Therapy N/A N/A Procedures Performed: Debridement Treatment Notes Electronic Signature(s) Signed: 06/22/2022 11:48:24 AM By: Kalman Shan DO Entered By: Kalman Shan on 06/22/2022 11:32:32 -------------------------------------------------------------------------------- Multi-Disciplinary Care Plan Details Patient Name: Date of Service: [name] 06/22/2022 11:00 A M Medical Record Number: [id number] Patient Account Number: [id number] Date of Birth/Sex: Treating RN: 04-Feb-1961 (61 y.o. [name], [name] Primary Care Taia Bramlett: Jenean Lindau Other Clinician: Referring Jaxsyn Catalfamo: Treating Henley Boettner/Extender: Elise Benne in Treatment: 15 Active Inactive Abuse / Safety / Falls / Self Care Management Nursing Diagnoses: History of Falls Goals: Patient will not experience any injury related to falls Date Initiated: 03/05/2022 Target Resolution Date: 05/28/2022 Goal Status: Active Interventions: Assess  fall risk on admission and as needed Assess: immobility, friction, shearing, incontinence upon admission and as needed Assess impairment of mobility on admission and as needed per policy Notes: 10/23/12: Fall prevention ongoing, recent fall. Wound/Skin Impairment Nursing Diagnoses: Impaired tissue integrity Goals: Patient/caregiver will verbalize understanding of skin care regimen Date Initiated: 03/05/2022 Target Resolution Date: 07/04/2022 Goal Status: Active Ulcer/skin breakdown will have a volume reduction of 30% by week 4 Date Initiated: 03/05/2022 Date Inactivated: 04/30/2022 Target Resolution Date: 05/02/2022 Goal Status: Met Ulcer/skin breakdown will have a volume reduction of 50% by week 8 Date Initiated: 04/30/2022 Target Resolution Date: 07/04/2022 Goal Status: Active Interventions: Assess patient/caregiver ability to obtain necessary supplies Assess patient/caregiver ability to perform ulcer/skin care regimen upon admission and as needed Assess ulceration(s) every visit Provide education on smoking Provide education on ulcer and skin care Treatment Activities: Topical wound management initiated : 03/05/2022 Notes: 04/30/22: Wound care regimen continues. Electronic Signature(s) Signed: 06/22/2022 3:56:14 PM By: Rhae Hammock RN Entered By: Rhae Hammock on 06/22/2022 11:16:41 -------------------------------------------------------------------------------- Pain Assessment Details Patient Name:  Date of Service: [name] 06/22/2022 11:00 A M Medical Record Number: [id number] Patient Account Number: [id number] Date of Birth/Sex: Treating RN: [birth year] (61 y.o. [name], [name] Primary Care Delina Kruczek: Jenean Lindau Other Clinician: Referring Kayia Billinger: Treating Vee Bahe/Extender: Elise Benne in Treatment: 15 Active Problems Location of Pain Severity and Description of Pain Patient Has Paino Yes Site Locations Pain  Location: Generalized Pain, Pain in Ulcers With Dressing Change: Yes Duration of the Pain. Constant / Intermittento Constant Rate the pain. Current Pain Level: 7 Worst Pain Level: 10 Least Pain Level: 0 Tolerable Pain Level: 7 Character of Pain Describe the Pain: Aching Pain Management and Medication Current Pain Management: Medication: No Cold Application: No Rest: No Massage: No Activity: No T.E.N.S.: No Heat Application: No Leg drop or elevation: No Is the Current Pain Management Adequate: Adequate How does your wound impact your activities of daily livingo Sleep: No Bathing: No Appetite: No Relationship With Others: No Bladder Continence: No Emotions: No Bowel Continence: No Work: No Toileting: No Drive: No Dressing: No Hobbies: No Electronic Signature(s) Signed: 06/22/2022 3:56:14 PM By: Rhae Hammock RN Entered By: Rhae Hammock on 06/22/2022 11:02:30 -------------------------------------------------------------------------------- Patient/Caregiver Education Details Patient Name: Date of Service: [name]. 9/18/2023andnbsp11:00 A M Medical Record Number: [id number] Patient Account Number: [id number] Date of Birth/Gender: Treating RN: [birth year] (61 y.o. [name], [name] Primary Care Physician: Jenean Lindau Other Clinician: Referring Physician: Treating Physician/Extender: Elise Benne in Treatment: 15 Education Assessment Education Provided To: Patient Education Topics Provided Smoking and Wound Healing: Methods: Explain/Verbal Responses: Reinforcements needed, State content correctly Electronic Signature(s) Signed: 06/22/2022 3:56:14 PM By: Rhae Hammock RN Entered By: Rhae Hammock on 06/22/2022 11:16:52 -------------------------------------------------------------------------------- Wound Assessment Details Patient Name: Date of Service: [name] 06/22/2022 11:00 A M Medical  Record Number: [id number] Patient Account Number: [id number] Date of Birth/Sex: Treating RN: 14-Jan-1961 (61 y.o. [name], [name] Primary Care Philippe Gang: Jenean Lindau Other Clinician: Referring Elin Seats: Treating Gatha Mcnulty/Extender: Elise Benne in Treatment: 15 Wound Status Wound Number: 1 Primary Diabetic Wound/Ulcer of the Lower Extremity Etiology: Wound Location: Left Calcaneus Wound Open Wounding Event: Other Lesion Status: Date Acquired: 10/06/2021 Comorbid Anemia, Chronic Obstructive Pulmonary Disease (COPD), Weeks Of Treatment: 15 History: Congestive Heart Failure, Hypertension, Myocardial Infarction, Clustered Wound: No Peripheral Venous Disease, Type II Diabetes, Neuropathy Photos Wound Measurements Length: (cm) 1 Width: (cm) 1 Depth: (cm) 0.2 Area: (cm) 0.785 Volume: (cm) 0.157 % Reduction in Area: 82.2% % Reduction in Volume: 82.2% Epithelialization: Medium (34-66%) Wound Description Classification: Grade 2 Wound Margin: Distinct, outline attached Exudate Amount: Medium Exudate Type: Serosanguineous Exudate Color: red, brown Foul Odor After Cleansing: No Slough/Fibrino Yes Wound Bed Granulation Amount: Large (67-100%) Exposed Structure Granulation Quality: Red, Pink Fascia Exposed: No Necrotic Amount: None Present (0%) Fat Layer (Subcutaneous Tissue) Exposed: Yes Tendon Exposed: No Muscle Exposed: No Joint Exposed: No Bone Exposed: No Treatment Notes Wound #1 (Calcaneus) Wound Laterality: Left Cleanser Soap and Water Discharge Instruction: May shower and wash wound with dial antibacterial soap and water prior to dressing change. Wound Cleanser Discharge Instruction: Cleanse the wound with wound cleanser prior to applying a clean dressing using gauze sponges, not tissue or cotton balls. Peri-Wound Care Triamcinolone 15 (g) Discharge Instruction: Use triamcinolone 15 (g) as directed Sween Lotion (Moisturizing  lotion) Discharge Instruction: Apply moisturizing lotion as directed Topical Primary Dressing Hydrofera Blue Ready Foam, 2.5 x2.5 in Discharge Instruction: Apply to wound bed as instructed Santyl  Ointment Discharge Instruction: Apply nickel thick amount to wound bed as instructed Secondary Dressing ABD Pad, 8x10 Discharge Instruction: Apply over primary dressing as directed. Woven Gauze Sponge, Non-Sterile 4x4 in Discharge Instruction: Apply over primary dressing as directed. Secured With Compression Wrap ThreePress (3 layer compression wrap) Discharge Instruction: Apply three layer compression as directed. Compression Stockings Add-Ons Electronic Signature(s) Signed: 06/22/2022 4:49:15 PM By: Deon Pilling RN, BSN Entered By: Deon Pilling on 06/22/2022 11:08:24 -------------------------------------------------------------------------------- Vitals Details Patient Name: Date of Service: [name], [name]. 06/22/2022 11:00 A M Medical Record Number: [id number] Patient Account Number: [id number] Date of Birth/Sex: Treating RN: 19-Mar-1961 (61 y.o. [name], [name] Primary Care Eliberto Sole: Jenean Lindau Other Clinician: Referring Atleigh Gruen: Treating Orlin Kann/Extender: Elise Benne in Treatment: 15 Vital Signs Time Taken: 10:59 Temperature (F): 98.1 Height (in): 66 Pulse (bpm): 74 Weight (lbs): 339 Respiratory Rate (breaths/min): 17 Body Mass Index (BMI): 54.7 Blood Pressure (mmHg): 148/74 Reference Range: 80 - 120 mg / dl Electronic Signature(s) Signed: 06/22/2022 3:56:14 PM By: Rhae Hammock RN Entered By: Rhae Hammock on 06/22/2022 11:02:47

## 2022-06-22 NOTE — Progress Notes (Signed)
Reamer, Vallerie C. ([id number]) Visit Report for 06/22/2022 Chief Complaint Document Details Patient Name: Date of Service: [name] Lax C. 06/22/2022 11:00 [name] Medical Record Number: [id number] Patient Account Number: [id number] Date of Birth/Sex: Treating RN: Jul 30, 1961 (61 y.o. F) Primary Care Provider: Jenean [name] Other Clinician: Referring Provider: Treating Provider/Extender: [name] in Treatment: 15 Information Obtained from: Patient Chief Complaint 03/05/2022; left foot wound Electronic Signature(s) Signed: 06/22/2022 11:48:24 AM By: [name] Shan DO Entered By: [name] on 06/22/2022 11:33:28 -------------------------------------------------------------------------------- Debridement Details Patient Name: Date of Service: [name] Lax C. 06/22/2022 11:00 [name] Medical Record Number: [id number] Patient Account Number: [id number] Date of Birth/Sex: Treating RN: April 04, 1961 (61 y.o. [name], [name] Primary Care Provider: Jenean [name] Other Clinician: Referring Provider: Treating Provider/Extender: [name] in Treatment: 15 Debridement Performed for Assessment: Wound #1 Left Calcaneus Performed By: Physician [name] Shan, DO Debridement Type: Debridement Severity of Tissue Pre Debridement: Fat layer exposed Level of Consciousness (Pre-procedure): Awake and Alert Pre-procedure Verification/Time Out Yes - 11:25 Taken: Start Time: 11:25 Pain Control: Lidocaine T Area Debrided (L x W): otal 1 (cm) x 1 (cm) = 1 (cm) Tissue and other material debrided: Callus, Slough, Subcutaneous, Slough Level: Skin/Subcutaneous Tissue Debridement Description: Excisional Instrument: Curette Bleeding: Minimum Hemostasis Achieved: Pressure End Time: 11:25 Procedural Pain: 0 Post Procedural Pain: 0 Response to Treatment: Procedure was tolerated well Level of Consciousness (Post- Awake and  Alert procedure): Post Debridement Measurements of Total Wound Length: (cm) 1 Width: (cm) 1 Depth: (cm) 0.2 Volume: (cm) 0.157 Character of Wound/Ulcer Post Debridement: Improved Severity of Tissue Post Debridement: Fat layer exposed Post Procedure Diagnosis Same as Pre-procedure Electronic Signature(s) Signed: 06/22/2022 11:48:24 AM By: [name] Shan DO Signed: 06/22/2022 3:56:14 PM By: [name] Hammock RN Entered By: [name] on 06/22/2022 11:27:44 -------------------------------------------------------------------------------- HPI Details Patient Name: Date of Service: [name], [name] MY C. 06/22/2022 11:00 [name] Medical Record Number: [id number] Patient Account Number: [id number] Date of Birth/Sex: Treating RN: 1960-12-23 (61 y.o. F) Primary Care Provider: Jenean [name] Other Clinician: Referring Provider: Treating Provider/Extender: [name] in Treatment: 15 History of Present Illness HPI Description: Admission 03/05/2022 [name] is [name] 62 year old female with [name] past medical history of uncontrolled insulin-dependent type 2 diabetes, tobacco user and chronic diastolic heart failure that presents to the clinic for [name] 66-month history of wound to her left heel. She states she had [name] left ankle fusion in September 2022. She states that she always had [name] wound after the surgery and it never healed. She has home health and they have been doing compression wraps along with silver alginate to the wound bed. She currently denies signs of infection. 6/8; this is [name] 61 year old woman with type 2 diabetes. She developed [name] wound on her left Achilles heel just above the tip of the heel in the setting of recurrent ankle surgeries in late 2022. I have seen some of these results from either the cast or the surgical boots that are put on after these operations. She thinks this may be the case. And [name] sense of pressure ulcer. In any case that she has been  using Santyl Hydrofera Blue under compression. She is not wearing any footwear at home as she cannot find anything to accommodate the wrap 6/22; patient presents for follow-up. She has home health that comes out once [name] week to help with dressing changes. She has no issues or complaints today. We have been using  Hydrofera Blue under compression therapy. 7/6; patient presents for follow-up. She states that home health did not come out for the past 2 weeks. It is unclear why. We have been using Hydrofera Blue and Santyl under compression therapy. 7/13; patient presents for follow-up. She did not take the oral antibiotics prescribed at last clinic visit. We have been using Hydrofera Blue with gentamicin/mupirocin ointment under compression therapy. She has no issues or complaints today. She denies signs of infection. 7/20; patient presents for follow-up. We have been using Hydrofera Blue with antibiotic ointment under 3 layer compression. She has home health that change the dressing once. They put collagen on the wound bed. She also reports falling yesterday and hitting her right foot. She has no pain to the area today. 7/27; patient presents for follow-up. We have been using Hydrofera Blue under 3 layer compression. She has home health that changes the dressing. She has no issues or complaints today. 8/3; patient presents for follow-up. We continues to use Hydrofera Blue under 3 layer compression. She has no issues or complaints today. She has been approved for Epicord at 100%. 8/11; patient presents for follow-up. We have been using Hydrofera Blue under 3 layer compression. She has been approved for Epicord and we have this today. She is in agreement with having this applied. 8/18; patient presents for follow-up. Epicord #1 was placed in standard fashion last clinic visit. She has no issues or complaints today. 9/18; Unfortunately patient has missed her last clinic appointments due to falling and  breaking her right ankle. We have been following her for her left ankle wound. She has also developed [name] large blister to the left heel over the past week that has ruptured and dried. She currently resides In [facility]. Electronic Signature(s) Signed: 06/22/2022 11:48:24 AM By: [name] Shan DO Entered By: [name] on 06/22/2022 11:38:18 -------------------------------------------------------------------------------- Physical Exam Details Patient Name: Date of Service: [name] Neighbor MY C. 06/22/2022 11:00 [name] Medical Record Number: [id number] Patient Account Number: [id number] Date of Birth/Sex: Treating RN: 05/21/1961 (61 y.o. F) Primary Care Provider: Jenean [name] Other Clinician: Referring Provider: Treating Provider/Extender: [name] in Treatment: 15 Constitutional respirations regular, non-labored and within target range for patient.. Cardiovascular 2+ dorsalis pedis/posterior tibialis pulses. Psychiatric pleasant and cooperative. Notes Left foot: T the posterior ankle there is an open wound with granulation tissue And nonviable tissue. T the heel there is [name] dried blister. No surrounding signs of o o infection. Electronic Signature(s) Signed: 06/22/2022 11:48:24 AM By: [name] Shan DO Entered By: [name] on 06/22/2022 11:41:27 -------------------------------------------------------------------------------- Physician Orders Details Patient Name: Date of Service: [name], [name] MY C. 06/22/2022 11:00 [name] Medical Record Number: [id number] Patient Account Number: [id number] Date of Birth/Sex: Treating RN: 1961-04-28 (61 y.o. [name], [name] Primary Care Provider: Jenean [name] Other Clinician: Referring Provider: Treating Provider/Extender: [name] in Treatment: 15 Verbal / Phone Orders: No Diagnosis Coding Follow-up Appointments ppointment in 1 week. - Dr. Heber [place] and Leveda Anna  (Room # 7) Return [name] Anesthetic (In clinic) Topical Lidocaine 5% applied to wound bed (In clinic) Topical Lidocaine 4% applied to wound bed Cellular or Tissue Based Products Cellular or Tissue Based Product Type: - Run IVR for EpiFix and Epicord=100% covered 05/07/22: Epicord ordered daptic or Mepitel. (DO NOT REMOVE). - Cellular or Tissue Based Product applied to wound bed, secured with steri-strips, cover with [name] Epicord #1 05/15/25, Epicord #2 05/22/22 Bathing/ Shower/ Hygiene May shower with protection but  do not get wound dressing(s) wet. - May use cast protector bag from [facility], [facility] or [employer] Edema Control - Lymphedema / SCD / Other Elevate legs to the level of the heart or above for 30 minutes daily and/or when sitting, [name] frequency of: Avoid standing for long periods of time. Additional Orders / Instructions Follow Nutritious Diet - Monitor/Control Blood Sugar Home Health New wound care orders this week; continue Home Health for wound care. May utilize formulary equivalent dressing for wound treatment orders unless otherwise specified. - No HH dressing change this week, Skin sub applied Other Home Health Orders/Instructions: - [facility] Wound Treatment Wound #1 - Calcaneus Wound Laterality: Left Cleanser: Soap and Water (Home Health) 2 x Per Week/30 Days Discharge Instructions: May shower and wash wound with dial antibacterial soap and water prior to dressing change. Cleanser: Wound Cleanser 2 x Per Week/30 Days Discharge Instructions: Cleanse the wound with wound cleanser prior to applying [name] clean dressing using gauze sponges, not tissue or cotton balls. Peri-Wound Care: Triamcinolone 15 (g) 2 x Per Week/30 Days Discharge Instructions: Use triamcinolone 15 (g) as directed Peri-Wound Care: Sween Lotion (Moisturizing lotion) (Home Health) 2 x Per Week/30 Days Discharge Instructions: Apply moisturizing lotion as directed Prim Dressing: Hydrofera Blue Ready Foam, 2.5 x2.5 in (Home  Health) 2 x Per Week/30 Days ary Discharge Instructions: Apply to wound bed as instructed Prim Dressing: Santyl Ointment ([place]) 2 x Per Week/30 Days ary Discharge Instructions: Apply nickel thick amount to wound bed as instructed Secondary Dressing: ABD Pad, 8x10 (Home Health) 2 x Per Week/30 Days Discharge Instructions: Apply over primary dressing as directed. Secondary Dressing: Woven Gauze Sponge, Non-Sterile 4x4 in (Home Health) 2 x Per Week/30 Days Discharge Instructions: Apply over primary dressing as directed. Compression Wrap: ThreePress (3 layer compression wrap) (Home Health) 2 x Per Week/30 Days Discharge Instructions: Apply three layer compression as directed. Electronic Signature(s) Signed: 06/22/2022 11:48:24 AM By: [name] Shan DO Entered By: [name] on 06/22/2022 11:41:35 -------------------------------------------------------------------------------- Problem List Details Patient Name: Date of Service: [name] Lax C. 06/22/2022 11:00 [name] Medical Record Number: [id number] Patient Account Number: [id number] Date of Birth/Sex: Treating RN: [birth year] (61 y.o. F) Primary Care Provider: Jenean [name] Other Clinician: Referring Provider: Treating Provider/Extender: [name] in Treatment: 15 Active Problems ICD-10 Encounter Code Description Active Date MDM Diagnosis [phone] Non-pressure chronic ulcer of other part of left foot with fat layer exposed 03/05/2022 No Yes E11.621 Type 2 diabetes mellitus with foot ulcer 03/05/2022 No Yes E66.01 Morbid (severe) obesity due to excess calories 03/05/2022 No Yes Inactive Problems Resolved Problems Electronic Signature(s) Signed: 06/22/2022 11:48:24 AM By: [name] Shan DO Entered By: [name] on 06/22/2022 11:32:26 -------------------------------------------------------------------------------- Progress Note Details Patient Name: Date of Service: [name] Lax  C. 06/22/2022 11:00 [name] Medical Record Number: [id number] Patient Account Number: [id number] Date of Birth/Sex: Treating RN: July 25, 1961 (61 y.o. F) Primary Care Provider: Jenean [name] Other Clinician: Referring Provider: Treating Provider/Extender: [name] in Treatment: 15 Subjective Chief Complaint Information obtained from Patient 03/05/2022; left foot wound History of Present Illness (HPI) Admission 03/05/2022 Ms. Jozi Salmonson is [name] 61 year old female with [name] past medical history of uncontrolled insulin-dependent type 2 diabetes, tobacco user and chronic diastolic heart failure that presents to the clinic for [name] 91-month history of wound to her left heel. She states she had [name] left ankle fusion in September 2022. She states that she always had [name] wound after the  surgery and it never healed. She has home health and they have been doing compression wraps along with silver alginate to the wound bed. She currently denies signs of infection. 6/8; this is [name] 61 year old woman with type 2 diabetes. She developed [name] wound on her left Achilles heel just above the tip of the heel in the setting of recurrent ankle surgeries in late 2022. I have seen some of these results from either the cast or the surgical boots that are put on after these operations. She thinks this may be the case. And [name] sense of pressure ulcer. In any case that she has been using Santyl Hydrofera Blue under compression. She is not wearing any footwear at home as she cannot find anything to accommodate the wrap 6/22; patient presents for follow-up. She has home health that comes out once [name] week to help with dressing changes. She has no issues or complaints today. We have been using Hydrofera Blue under compression therapy. 7/6; patient presents for follow-up. She states that home health did not come out for the past 2 weeks. It is unclear why. We have been using Hydrofera Blue and Santyl under compression  therapy. 7/13; patient presents for follow-up. She did not take the oral antibiotics prescribed at last clinic visit. We have been using Hydrofera Blue with gentamicin/mupirocin ointment under compression therapy. She has no issues or complaints today. She denies signs of infection. 7/20; patient presents for follow-up. We have been using Hydrofera Blue with antibiotic ointment under 3 layer compression. She has home health that change the dressing once. They put collagen on the wound bed. She also reports falling yesterday and hitting her right foot. She has no pain to the area today. 7/27; patient presents for follow-up. We have been using Hydrofera Blue under 3 layer compression. She has home health that changes the dressing. She has no issues or complaints today. 8/3; patient presents for follow-up. We continues to use Hydrofera Blue under 3 layer compression. She has no issues or complaints today. She has been approved for Epicord at 100%. 8/11; patient presents for follow-up. We have been using Hydrofera Blue under 3 layer compression. She has been approved for Epicord and we have this today. She is in agreement with having this applied. 8/18; patient presents for follow-up. Epicord #1 was placed in standard fashion last clinic visit. She has no issues or complaints today. 9/18; Unfortunately patient has missed her last clinic appointments due to falling and breaking her right ankle. We have been following her for her left ankle wound. She has also developed [name] large blister to the left heel over the past week that has ruptured and dried. She currently resides In [facility]. Patient History Information obtained from Patient, Chart. Family History Cancer - Father,Mother, Diabetes - Mother,Maternal Grandparents,Paternal Grandparents, Heart Disease - Siblings,Maternal Grandparents, Hypertension - Siblings, Tuberculosis - Maternal Grandparents,Paternal Grandparents. Social History Current  every day smoker, Marital Status - Single, Alcohol Use - Never, Drug Use - No History, Caffeine Use - Daily. 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 Vitals Time Taken: 10:59 AM, Height: 66 in, Weight: 339 lbs, BMI: 54.7, Temperature: 98.1 F, Pulse: 74 bpm, Respiratory Rate: 17 breaths/min, Blood Pressure: 148/74 mmHg. Cardiovascular 2+ dorsalis pedis/posterior tibialis pulses. Psychiatric pleasant and cooperative. General Notes: Left foot: T the posterior ankle there is an open wound with granulation tissue And nonviable tissue. T the heel there is [name] dried blister. No o o surrounding signs of infection. Integumentary (Hair, Skin) Wound #1 status is Open. Original cause of wound was Other Lesion. The date acquired was: 10/06/2021. The wound has been in treatment 15 weeks. The wound is located on the Left Calcaneus. The wound measures 1cm length x 1cm width x 0.2cm depth; 0.785cm^2 area and 0.157cm^3 volume. There is Fat Layer (Subcutaneous Tissue) exposed. There is [name] medium amount of serosanguineous drainage noted. The wound margin is distinct with the outline attached to the wound base. There is large (67-100%) red, pink granulation within the wound bed. There is no necrotic tissue within the wound bed. Assessment Active Problems ICD-10 Non-pressure chronic  ulcer of other part of left foot with fat layer exposed Type 2 diabetes mellitus with foot ulcer Morbid (severe) obesity due to excess calories Patient's wound is stable since last seen 1 month ago. No signs of surrounding infection. I debrided nonviable tissue. I recommended Santyl and Hydrofera Blue under 3 layer compression. She has [name] blister on that has dried to the left heel. I recommended Xeroform to this area under the wrap. If wound shows improvement we can restart Epicord. Procedures Wound #1 Pre-procedure diagnosis of Wound #1 is [name] Diabetic Wound/Ulcer of the Lower Extremity located on the Left Calcaneus .Severity of Tissue Pre Debridement is: Fat layer exposed. There was [name] Excisional Skin/Subcutaneous Tissue Debridement with [name] total area of 1 sq cm performed by [name] Shan, DO. With the following instrument(s): Curette Material removed includes Callus, Subcutaneous Tissue, and Slough after achieving pain control using Lidocaine. No specimens were taken. [name] time out was conducted at 11:25, prior to the start of the procedure. [name] Minimum amount of bleeding was controlled with Pressure. The procedure was tolerated well with [name] pain level of 0 throughout and [name] pain level of 0 following the procedure. Post Debridement Measurements: 1cm length x 1cm width x 0.2cm depth; 0.157cm^3 volume. Character of Wound/Ulcer Post Debridement is improved. Severity of Tissue Post Debridement is: Fat layer exposed. Post procedure Diagnosis Wound #1: Same as Pre-Procedure Pre-procedure diagnosis of Wound #1 is [name] Diabetic Wound/Ulcer of the Lower Extremity located on the Left Calcaneus . There was [name] Three Layer Compression Therapy Procedure by [name] Hammock, RN. Post procedure Diagnosis Wound #1: Same as Pre-Procedure Plan Follow-up Appointments: Return Appointment in 1 week. - Dr. Heber Yaak and Leveda Anna (Room # 7) Anesthetic: (In clinic) Topical Lidocaine 5% applied to wound bed (In clinic) Topical  Lidocaine 4% applied to wound bed Cellular or Tissue Based Products: Cellular or Tissue Based Product Type: - Run IVR for EpiFix and Epicord=100% covered 05/07/22: Epicord ordered Cellular or Tissue Based Product applied to wound bed, secured with steri-strips, cover with Adaptic or Mepitel. (DO NOT REMOVE). - Epicord #1 05/15/25, Epicord #2 05/22/22 Bathing/ Shower/ Hygiene: May shower with protection but do not get wound dressing(s) wet. - May use cast protector bag from [facility], [facility] or [employer] Edema Control - Lymphedema / SCD / Other: Elevate legs to the level of the heart or above for 30 minutes daily and/or when sitting, [name] frequency of: Avoid standing for  long periods of time. Additional Orders / Instructions: Follow Nutritious Diet - Monitor/Control Blood Sugar Home Health: New wound care orders this week; continue Home Health for wound care. May utilize formulary equivalent dressing for wound treatment orders unless otherwise specified. - No HH dressing change this week, Skin sub applied Other Home Health Orders/Instructions: - [facility] WOUND #1: - Calcaneus Wound Laterality: Left Cleanser: Soap and Water (Home Health) 2 x Per Week/30 Days Discharge Instructions: May shower and wash wound with dial antibacterial soap and water prior to dressing change. Cleanser: Wound Cleanser 2 x Per Week/30 Days Discharge Instructions: Cleanse the wound with wound cleanser prior to applying [name] clean dressing using gauze sponges, not tissue or cotton balls. Peri-Wound Care: Triamcinolone 15 (g) 2 x Per Week/30 Days Discharge Instructions: Use triamcinolone 15 (g) as directed Peri-Wound Care: Sween Lotion (Moisturizing lotion) (Home Health) 2 x Per Week/30 Days Discharge Instructions: Apply moisturizing lotion as directed Prim Dressing: Hydrofera Blue Ready Foam, 2.5 x2.5 in (Home Health) 2 x Per Week/30 Days ary Discharge Instructions: Apply to wound bed as instructed Prim Dressing: Santyl Ointment  ([place]) 2 x Per Week/30 Days ary Discharge Instructions: Apply nickel thick amount to wound bed as instructed Secondary Dressing: ABD Pad, 8x10 (Home Health) 2 x Per Week/30 Days Discharge Instructions: Apply over primary dressing as directed. Secondary Dressing: Woven Gauze Sponge, Non-Sterile 4x4 in (Home Health) 2 x Per Week/30 Days Discharge Instructions: Apply over primary dressing as directed. Com pression Wrap: ThreePress (3 layer compression wrap) (Home Health) 2 x Per Week/30 Days Discharge Instructions: Apply three layer compression as directed. 1. In office sharp debridement 2. Santyl and Hydrofera Blue under 3 layer compression 3. Follow-up in 1 week Electronic Signature(s) Signed: 06/22/2022 11:48:24 AM By: [name] Shan DO Entered By: [name] on 06/22/2022 11:44:32 -------------------------------------------------------------------------------- HxROS Details Patient Name: Date of Service: [name], [name] MY C. 06/22/2022 11:00 [name] Medical Record Number: [id number] Patient Account Number: [id number] Date of Birth/Sex: Treating RN: 04/26/1961 (61 y.o. F) Primary Care Provider: Jenean [name] Other Clinician: Referring Provider: Treating Provider/Extender: [name] in Treatment: 15 Information Obtained From Patient Chart Hematologic/Lymphatic Medical History: Positive for: Anemia Respiratory Medical History: Positive for: Chronic Obstructive Pulmonary Disease (COPD) Cardiovascular Medical History: Positive for: Congestive Heart Failure - Weighs self daily; Hypertension; Myocardial Infarction; Peripheral Venous Disease Gastrointestinal Medical History: Past Medical History Notes: GERD, Peptic Ulcer Disease Endocrine Medical History: Positive for: Type II Diabetes Time with diabetes: 16 years Treated with: Insulin, Oral agents Blood sugar tested every day: Yes Tested : Twice daily Musculoskeletal Medical  History: Past Medical History Notes: Broke left leg 3 years ago, 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, foot fused Neurologic Medical History: Positive for: Neuropathy Psychiatric Medical History: Past Medical History Notes: Depression Immunizations Pneumococcal Vaccine: Received Pneumococcal Vaccination: No Implantable Devices None Family and Social History Cancer: Yes - Father,Mother; Diabetes: Yes - Mother,Maternal Grandparents,Paternal Grandparents; Heart Disease: Yes - Siblings,Maternal Grandparents; Hypertension: Yes - Siblings; Tuberculosis: Yes - Maternal Grandparents,Paternal Grandparents; Current every day smoker; Marital Status - Single; Alcohol Use: Never; Drug Use: No History; Caffeine Use: Daily; Financial Concerns: No; Food, Clothing or Shelter Needs: No; Support System Lacking: No; Transportation Concerns: No Electronic Signature(s) Signed: 06/22/2022 11:48:24 AM By: [name] Shan DO Entered By: [name] on 06/22/2022 11:40:37 -------------------------------------------------------------------------------- SuperBill Details Patient Name: Date of Service: [name] Lax C. 06/22/2022 Medical Record Number: [id number] Patient Account Number: [id number] Date of Birth/Sex: Treating RN: Aug 23, 1961 (61 y.o. [name], [name]  Primary Care Provider: Jenean [name] Other Clinician: Referring Provider: Treating Provider/Extender: [name] in Treatment: 15 Diagnosis Coding ICD-10 Codes Code Description [id number] Non-pressure chronic ulcer of other part of left foot with fat layer exposed E11.621 Type 2 diabetes mellitus with foot ulcer E66.01 Morbid (severe) obesity due to excess calories Facility Procedures CPT4 Code: [id number] Description: [id number] - DEB SUBQ TISSUE 20 SQ CM/< ICD-10 Diagnosis Description L97.522 Non-pressure chronic ulcer of other part of left foot with fat layer exposed Modifier: Quantity: 1 Physician Procedures :  CPT4 Code Description Modifier [id number] - WC PHYS SUBQ TISS 20 SQ CM ICD-10 Diagnosis Description L97.522 Non-pressure chronic ulcer of other part of left foot with fat layer exposed Quantity: 1 Electronic Signature(s) Signed: 06/22/2022 11:48:24 AM By: [name] Shan DO Entered By: [name] on 06/22/2022 11:44:42

## 2022-06-29 ENCOUNTER — Encounter (HOSPITAL_BASED_OUTPATIENT_CLINIC_OR_DEPARTMENT_OTHER): Payer: 59 | Admitting: Internal Medicine

## 2022-06-29 DIAGNOSIS — L97522 Non-pressure chronic ulcer of other part of left foot with fat layer exposed: Secondary | ICD-10-CM

## 2022-06-29 DIAGNOSIS — E11621 Type 2 diabetes mellitus with foot ulcer: Secondary | ICD-10-CM | POA: Diagnosis not present

## 2022-06-29 NOTE — Progress Notes (Signed)
Whitefield, Jelisha C. ([id number]) Visit Report for 06/29/2022 Chief Complaint Document Details Patient Name: Date of Service: [name], [name]. 06/29/2022 8:45 A M Medical Record Number: [id number] Patient Account Number: [id number] Date of Birth/Sex: Treating RN: 1961-03-13 (60 y.o. F) Primary Care Provider: Campbell, Stephen Other Clinician: Referring Provider: Treating Provider/Extender: Loyce Klasen Campbell, Stephen Weeks in Treatment: 16 Information Obtained from: Patient Chief Complaint 03/05/2022; left foot wound Electronic Signature(s) Signed: 06/29/2022 10:16:22 AM By: Imogene Gravelle DO Entered By: Maribelle Hopple on 06/29/2022 09:30:32 -------------------------------------------------------------------------------- Cellular or Tissue Based Product Details Patient Name: Date of Service: [name], [name]. 06/29/2022 8:45 A M Medical Record Number: [id number] Patient Account Number: [id number] Date of Birth/Sex: Treating RN: 1961-03-13 (60 y.o. F) [name] Primary Care Provider: Campbell, Stephen Other Clinician: Referring Provider: Treating Provider/Extender: Ksenia Kunz Campbell, Stephen Weeks in Treatment: 16 Cellular or Tissue Based Product Type Wound #1 Left Calcaneus Applied to: Performed By: Physician Darria Corvera, DO Cellular or Tissue Based Product Type: Epicord Level of Consciousness (Pre-procedure): Awake and Alert Pre-procedure Verification/Time Out Yes - 09:05 Taken: Location: genitalia / hands / feet / multiple digits Wound Size (sq cm): 1.12 Product Size (sq cm): 6 Waste Size (sq cm): 2 Waste Reason: wound size Amount of Product Applied (sq cm): 4 Instrument Used: Forceps, Scissors Lot #: [id number] Order #: 3 Expiration Date: 02/03/2027 Fenestrated: No Reconstituted: Yes Solution Type: normal Saline Solution Amount: 28mMac[facility]7Charl78m[facility]29mPMacN3[facility]2Charl77mPMacN6[facility]0Charl67mPMacN2[facility]1Charl49mPMacN2[facility]2Charl16mPMacN3[facility]5Charl78mPMacN2[facility]5Charl49mPMacN2[facility]5Charl69mPMacN6[facility]6Charl93mPMacN6CuL[facility]3Charl53mPMacN8[facility]1Charl3mPMacN4[facility]7Charl66m[facility] Ta26mPMacN6[facility]4Charl4mPMacN6[facility]5Charl25mPMacN7[facility]0Charl37mPMacN7[facility]0Charl48mPMacN6[facility]6Charl20mPMacN7MiLL[facility]4Charl63mPMacN8[facility]3Charl41mPMacN7[facility]4Charl20mPMacN4[facility]1Charl94mPMacN4[facility]8Charl24mPMacTimberlawn Mental Health [facility]8Charl33mPMacN3[facility]2Charl73mPMacN5[facility]7Charl52mPMVance [facility] Bill[facility] LLCaCharl44mPMacN7[facility]4Charl32mPMacN7[facility]7Charl60mPMacN1[facility]5Charl65mPMacChi Health [facility]9Charl48mPMacN5[facility]0CharlPieter Partridge5 Solution Expiration Date: 07/05/2022 Secured: Yes Secured With: Steri-Strips,  adaptic Dressing Applied: No Procedural Pain: 0 Post Procedural Pain: 0 Response to Treatment: Procedure was tolerated well Level of Consciousness (Post- Awake and Alert procedure): Post Procedure Diagnosis Same as Pre-procedure Electronic Signature(s) Signed: 06/29/2022 10:16:22 AM By: [name] Maslanka DO Signed: 06/29/2022 5:50:10 PM By: [name], Bobbi RN, BSN Entered By: [name] on 06/29/2022 09:13:06 -------------------------------------------------------------------------------- Debridement Details Patient Name: Date of Service: [name], [name]. 06/29/2022 8:45 A M Medical Record Number: [id number] Patient Account Number: [id number] Date of Birth/Sex: Treating RN: 1961-03-13 (60 y.o. F) [name] Primary Care Provider: Campbell, Stephen Other Clinician: Referring Provider: Treating Provider/Extender: [name] Campbell, Stephen Weeks in Treatment: 16 Debridement Performed for Assessment: Wound #1 Left Calcaneus Performed By: Physician [name] Christner, DO Debridement Type: Debridement Severity of Tissue Pre Debridement: Fat layer exposed Level of Consciousness (Pre-procedure): Awake and Alert Pre-procedure Verification/Time Out Yes - 08:55 Taken: Start Time: 08:56 Pain Control: Lidocaine 5% topical ointment T Area Debrided (L x W): otal 1.4 (cm) x 0.8 (cm) = 1.12 (cm) Tissue and other material debrided: Viable, Non-Viable, Slough, Subcutaneous, Slough Level: Skin/Subcutaneous Tissue Debridement Description: Excisional Instrument: Curette Bleeding: Minimum Hemostasis Achieved: Pressure End Time: 09:02 Procedural Pain: 0 Post Procedural Pain: 0 Response to Treatment: Procedure was tolerated well Level of Consciousness (Post- Awake and Alert procedure): Post Debridement Measurements of Total Wound Length: (cm) 1.4 Width: (cm) 0.8 Depth: (cm) 0.1 Volume: (cm) 0.088 Character of Wound/Ulcer Post Debridement: Improved Severity of Tissue Post  Debridement: Fat layer exposed Post Procedure Diagnosis Same as Pre-procedure Electronic Signature(s) Signed: 06/29/2022 10:16:22 AM By: Shreya Lacasse DO Signed: 06/29/2022 5:50:10 PM By: [name], Bobbi RN, BSN Entered By: [name] on 06/29/2022 09:03:05 -------------------------------------------------------------------------------- Debridement Details Patient Name: Date of Service: [name], [name]. 06/29/2022 8:45 A M Medical Record Number: [id number] Patient Account Number: [id number] Date of Birth/Sex: Treating RN: 1961-03-13 (60 y.o. F) [name] Primary Care Provider: Campbell, Stephen Other Clinician: Referring Provider: Treating Provider/Extender: Aseel Uhde Campbell, Stephen Weeks  in Treatment: 16 Debridement Performed for Assessment: Wound #2 Left,Distal Calcaneus Performed By: Physician Kalman Shan, DO Debridement Type: Debridement Severity of Tissue Pre Debridement: Fat layer exposed Level of Consciousness (Pre-procedure): Awake and Alert Pre-procedure Verification/Time Out Yes - 08:55 Taken: Start Time: 08:56 Pain Control: Lidocaine 5% topical ointment T Area Debrided (L x W): otal 3 (cm) x 6 (cm) = 18 (cm) Tissue and other material debrided: Viable, Non-Viable, Slough, Subcutaneous, Skin: Dermis , Skin: Epidermis, Slough Level: Skin/Subcutaneous Tissue Debridement Description: Excisional Instrument: Curette Bleeding: Minimum Hemostasis Achieved: Pressure End Time: 09:02 Procedural Pain: 0 Post Procedural Pain: 0 Response to Treatment: Procedure was tolerated well Level of Consciousness (Post- Awake and Alert procedure): Post Debridement Measurements of Total Wound Length: (cm) 3 Width: (cm) 6 Depth: (cm) 0.1 Volume: (cm) 1.414 Character of Wound/Ulcer Post Debridement: Improved Severity of Tissue Post Debridement: Fat layer exposed Post Procedure Diagnosis Same as Pre-procedure Electronic Signature(s) Signed: 06/29/2022 10:16:22  AM By: Kalman Shan DO Signed: 06/29/2022 5:50:10 PM By: Deon Pilling RN, BSN Entered By: Deon Pilling on 06/29/2022 09:03:33 -------------------------------------------------------------------------------- HPI Details Patient Name: Date of Service: [name] 06/29/2022 8:45 A M Medical Record Number: [id number] Patient Account Number: [id number] Date of Birth/Sex: Treating RN: 05/28/1961 (61 y.o. F) Primary Care Provider: Jenean Lindau Other Clinician: Referring Provider: Treating Provider/Extender: Elise Benne in Treatment: 16 History of Present Illness HPI Description: Admission 03/05/2022 Ms. Shamyia Ferrall is a 61 year old female with a past medical history of uncontrolled insulin-dependent type 2 diabetes, tobacco user and chronic diastolic heart failure that presents to the clinic for a 61-month history of wound to her left heel. She states she had a left ankle fusion in September 2022. She states that she always had a wound after the surgery and it never healed. She has home health and they have been doing compression wraps along with silver alginate to the wound bed. She currently denies signs of infection. 6/8; this is a 61 year old woman with type 2 diabetes. She developed a wound on her left Achilles heel just above the tip of the heel in the setting of recurrent ankle surgeries in late 2022. I have seen some of these results from either the cast or the surgical boots that are put on after these operations. She thinks this may be the case. And a sense of pressure ulcer. In any case that she has been using Santyl Hydrofera Blue under compression. She is not wearing any footwear at home as she cannot find anything to accommodate the wrap 6/22; patient presents for follow-up. She has home health that comes out once a week to help with dressing changes. She has no issues or complaints today. We have been using Hydrofera Blue under compression  therapy. 7/6; patient presents for follow-up. She states that home health did not come out for the past 2 weeks. It is unclear why. We have been using Hydrofera Blue and Santyl under compression therapy. 7/13; patient presents for follow-up. She did not take the oral antibiotics prescribed at last clinic visit. We have been using Hydrofera Blue with gentamicin/mupirocin ointment under compression therapy. She has no issues or complaints today. She denies signs of infection. 7/20; patient presents for follow-up. We have been using Hydrofera Blue with antibiotic ointment under 3 layer compression. She has home health that change the dressing once. They put collagen on the wound bed. She also reports falling yesterday and hitting her right foot. She has no pain to the area  today. 7/27; patient presents for follow-up. We have been using Hydrofera Blue under 3 layer compression. She has home health that changes the dressing. She has no issues or complaints today. 8/3; patient presents for follow-up. We continues to use Hydrofera Blue under 3 layer compression. She has no issues or complaints today. She has been approved for Epicord at 100%. 8/11; patient presents for follow-up. We have been using Hydrofera Blue under 3 layer compression. She has been approved for Epicord and we have this today. She is in agreement with having this applied. 8/18; patient presents for follow-up. Epicord #1 was placed in standard fashion last clinic visit. She has no issues or complaints today. 9/18; Unfortunately patient has missed her last clinic appointments due to falling and breaking her right ankle. We have been following her for her left ankle wound. She has also developed a large blister to the left heel over the past week that has ruptured and dried. She currently [facility] In [facility]. Wound9/25; patient presents for follow-up. We have been using Hydrofera Blue and Santyl to the original left ankle wound and  Xeroform to the dried blistered. We have been wrapping her with compression therapy. She resides in a facility and they changed the wrap once this past week. She has no issues or complaints today. She denies signs of infection. Electronic Signature(s) Signed: 06/29/2022 10:16:22 AM By: Geralyn Corwin DO Entered By: Geralyn Corwin on 06/29/2022 09:31:08 -------------------------------------------------------------------------------- Physical Exam Details Patient Name: Date of Service: [name] 06/29/2022 8:45 A M Medical Record Number: [id number] Patient Account Number: [id number] Date of Birth/Sex: Treating RN: Dec 16, 1960 (61 y.o. F) Primary Care Provider: [name] Other Clinician: Referring Provider: Treating Provider/Extender: [name] in Treatment: 16 Constitutional respirations regular, non-labored and within target range for patient.. Cardiovascular 2+ dorsalis pedis/posterior tibialis pulses. Psychiatric pleasant and cooperative. Notes Left foot: T the posterior ankle there is an open wound with granulation tissue And nonviable tissue. T the heel there is a dried blister With granulation tissue o o T the edges. No surrounding signs of infection. o Electronic Signature(s) Signed: 06/29/2022 10:16:22 AM By: Geralyn Corwin DO Entered By: Geralyn Corwin on 06/29/2022 09:31:49 -------------------------------------------------------------------------------- Physician Orders Details Patient Name: Date of Service: [name] 06/29/2022 8:45 A M Medical Record Number: [id number] Patient Account Number: [id number] Date of Birth/Sex: Treating RN: 05-30-1961 (61 y.o. [name], [name] Primary Care Provider: [name] Other Clinician: Referring Provider: Treating Provider/Extender: [name] in Treatment: 16 Verbal / Phone Orders: No Diagnosis Coding ICD-10 Coding Code  Description [phone] Non-pressure chronic ulcer of other part of left foot with fat layer exposed E11.621 Type 2 diabetes mellitus with foot ulcer E66.01 Morbid (severe) obesity due to excess calories Follow-up Appointments ppointment in 1 week. - Dr. Mikey Bussing and Lennox Laity (Room # 7) Return A Other: - *****FACILITY TO NOT CHANGE DRESSINGS. LEAVE IN PLACE TO LEFT LEG/HEEL WOUNDS.*** Anesthetic (In clinic) Topical Lidocaine 5% applied to wound bed Cellular or Tissue Based Products Cellular or Tissue Based Product Type: - Run IVR for EpiFix and Epicord=100% covered 05/07/22: Epicord ordered daptic or Mepitel. (DO NOT REMOVE). - Cellular or Tissue Based Product applied to wound bed, secured with steri-strips, cover with A Epicord #1 05/15/25 Epicord #2 05/22/22 Epicord #3 06/29/2022 Bathing/ Shower/ Hygiene May shower with protection but do not get wound dressing(s) wet. - May use cast protector bag from [facility], [facility] or [employer] Edema Control - Lymphedema / SCD /  Other Elevate legs to the level of the heart or above for 30 minutes daily and/or when sitting, a frequency of: Avoid standing for long periods of time. Additional Orders / Instructions Follow Nutritious Diet - Monitor/Control Blood Sugar Wound Treatment Wound #1 - Calcaneus Wound Laterality: Left Cleanser: Soap and Water 1 x Per Week/30 Days Discharge Instructions: May shower and wash wound with dial antibacterial soap and water prior to dressing change. Cleanser: Wound Cleanser 1 x Per Week/30 Days Discharge Instructions: Cleanse the wound with wound cleanser prior to applying a clean dressing using gauze sponges, not tissue or cotton balls. Peri-Wound Care: Triamcinolone 15 (g) 1 x Per Week/30 Days Discharge Instructions: Use triamcinolone 15 (g) as directed Peri-Wound Care: Sween Lotion (Moisturizing lotion) 1 x Per Week/30 Days Discharge Instructions: Apply moisturizing lotion as directed Prim Dressing: Epicord 1 x Per Week/30  Days ary Discharge Instructions: applied by provider secured with adaptic and steri-strips. Secondary Dressing: ALLEVYN Heel 4 1/2in x 5 1/2in / 10.5cm x 13.5cm 1 x Per Week/30 Days Discharge Instructions: Apply over primary dressing as directed. Secondary Dressing: Woven Gauze Sponge, Non-Sterile 4x4 in (Home Health) 1 x Per Week/30 Days Discharge Instructions: Apply over primary dressing as directed. Compression Wrap: ThreePress (3 layer compression wrap) (Home Health) 1 x Per Week/30 Days Discharge Instructions: Apply three layer compression as directed. Wound #2 - Calcaneus Wound Laterality: Left, Distal Cleanser: Soap and Water 1 x Per Week/30 Days Discharge Instructions: May shower and wash wound with dial antibacterial soap and water prior to dressing change. Cleanser: Wound Cleanser 1 x Per Week/30 Days Discharge Instructions: Cleanse the wound with wound cleanser prior to applying a clean dressing using gauze sponges, not tissue or cotton balls. Peri-Wound Care: Triamcinolone 15 (g) 1 x Per Week/30 Days Discharge Instructions: Use triamcinolone 15 (g) as directed Peri-Wound Care: Sween Lotion (Moisturizing lotion) 1 x Per Week/30 Days Discharge Instructions: Apply moisturizing lotion as directed Prim Dressing: Xeroform Occlusive Gauze Dressing, 4x4 in 1 x Per Week/30 Days ary Discharge Instructions: Apply to wound bed as instructed Secondary Dressing: ALLEVYN Heel 4 1/2in x 5 1/2in / 10.5cm x 13.5cm 1 x Per Week/30 Days Discharge Instructions: Apply over primary dressing as directed. Secondary Dressing: Woven Gauze Sponge, Non-Sterile 4x4 in (Home Health) 1 x Per Week/30 Days Discharge Instructions: Apply over primary dressing as directed. Compression Wrap: ThreePress (3 layer compression wrap) (Home Health) 1 x Per Week/30 Days Discharge Instructions: Apply three layer compression as directed. Electronic Signature(s) Signed: 06/29/2022 10:16:22 AM By: Geralyn Corwin DO Entered  By: Geralyn Corwin on 06/29/2022 09:32:00 -------------------------------------------------------------------------------- Problem List Details Patient Name: Date of Service: [name] 06/29/2022 8:45 A M Medical Record Number: [id number] Patient Account Number: [id number] Date of Birth/Sex: Treating RN: 23-May-1961 (61 y.o. [name], [name] Primary Care Provider: [name] Other Clinician: Referring Provider: Treating Provider/Extender: [name] in Treatment: 16 Active Problems ICD-10 Encounter Code Description Active Date MDM Diagnosis [phone] Non-pressure chronic ulcer of other part of left foot with fat layer exposed 03/05/2022 No Yes E11.621 Type 2 diabetes mellitus with foot ulcer 03/05/2022 No Yes E66.01 Morbid (severe) obesity due to excess calories 03/05/2022 No Yes Inactive Problems Resolved Problems Electronic Signature(s) Signed: 06/29/2022 10:16:22 AM By: Geralyn Corwin DO Entered By: Geralyn Corwin on 06/29/2022 09:30:19 -------------------------------------------------------------------------------- Progress Note Details Patient Name: Date of Service: [name] 06/29/2022 8:45 A M Medical Record Number: [id number] Patient Account Number: [id number] Date of Birth/Sex: Treating RN: Sep 25, 1961 (  61 y.o. F) Primary Care Provider: Junious Dresserampbell, Stephen Other Clinician: Referring Provider: Treating Provider/Extender: Andree MoroHoffman, Micco Bourbeau Campbell, Stephen Weeks in Treatment: 16 Subjective Chief Complaint Information obtained from Patient 03/05/2022; left foot wound History of Present Illness (HPI) Admission 03/05/2022 [name] is a 61 year old female with a past medical history of uncontrolled insulin-dependent type 2 diabetes, tobacco user and chronic diastolic heart failure that presents to the clinic for a 5018-month history of wound to her left heel. She states she had a left ankle fusion in September 2022. She  states that she always had a wound after the surgery and it never healed. She has home health and they have been doing compression wraps along with silver alginate to the wound bed. She currently denies signs of infection. 6/8; this is a 61 year old woman with type 2 diabetes. She developed a wound on her left Achilles heel just above the tip of the heel in the setting of recurrent ankle surgeries in late 2022. I have seen some of these results from either the cast or the surgical boots that are put on after these operations. She thinks this may be the case. And a sense of pressure ulcer. In any case that she has been using Santyl Hydrofera Blue under compression. She is not wearing any footwear at home as she cannot find anything to accommodate the wrap 6/22; patient presents for follow-up. She has home health that comes out once a week to help with dressing changes. She has no issues or complaints today. We have been using Hydrofera Blue under compression therapy. 7/6; patient presents for follow-up. She states that home health did not come out for the past 2 weeks. It is unclear why. We have been using Hydrofera Blue and Santyl under compression therapy. 7/13; patient presents for follow-up. She did not take the oral antibiotics prescribed at last clinic visit. We have been using Hydrofera Blue with gentamicin/mupirocin ointment under compression therapy. She has no issues or complaints today. She denies signs of infection. 7/20; patient presents for follow-up. We have been using Hydrofera Blue with antibiotic ointment under 3 layer compression. She has home health that change the dressing once. They put collagen on the wound bed. She also reports falling yesterday and hitting her right foot. She has no pain to the area today. 7/27; patient presents for follow-up. We have been using Hydrofera Blue under 3 layer compression. She has home health that changes the dressing. She has no issues or  complaints today. 8/3; patient presents for follow-up. We continues to use Hydrofera Blue under 3 layer compression. She has no issues or complaints today. She has been approved for Epicord at 100%. 8/11; patient presents for follow-up. We have been using Hydrofera Blue under 3 layer compression. She has been approved for Epicord and we have this today. She is in agreement with having this applied. 8/18; patient presents for follow-up. Epicord #1 was placed in standard fashion last clinic visit. She has no issues or complaints today. 9/18; Unfortunately patient has missed her last clinic appointments due to falling and breaking her right ankle. We have been following her for her left ankle wound. She has also developed a large blister to the left heel over the past week that has ruptured and dried. [place] Wound9/25; patient presents for follow-up. We have been using Hydrofera Blue and Santyl to the original left ankle wound and Xeroform to the dried blistered. We have been wrapping her with compression therapy. She  resides in a facility and they changed the wrap once this past week. She has no issues or complaints today. She denies signs of infection. Patient History Information obtained from Patient, Chart. Family History Cancer - Father,Mother, Diabetes - Mother,Maternal Grandparents,Paternal Grandparents, Heart Disease - Siblings,Maternal Grandparents, Hypertension - Siblings, Tuberculosis - Maternal Grandparents,Paternal Grandparents. Social History Current every day smoker, Marital Status - Single, Alcohol Use - Never, Drug Use - No History, Caffeine Use - Daily. Medical History Hematologic/Lymphatic Patient has history of Anemia Respiratory Patient has history of Chronic Obstructive Pulmonary Disease (COPD) Cardiovascular Patient has history of Congestive Heart Failure - Weighs self daily, Hypertension, Myocardial Infarction, Peripheral Venous  Disease Endocrine Patient has history of Type II Diabetes Neurologic Patient has history of Neuropathy Medical A Surgical History Notes nd Gastrointestinal GERD, Peptic Ulcer Disease Musculoskeletal Broke left leg 3 years ago, 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 Vitals Time Taken: 8:30 AM, Height: 66 in, Weight: 339 lbs, BMI: 54.7, Temperature: 98.2 F, Pulse: 99 bpm, Respiratory Rate: 20 breaths/min, Blood Pressure: 170/75 mmHg. General Notes: per patient did not take morning BP medications. Cardiovascular 2+ dorsalis pedis/posterior tibialis pulses. Psychiatric pleasant and cooperative. General Notes: Left foot: T the posterior ankle there is an open wound with granulation tissue And nonviable tissue. T the heel there is a dried blister With o o granulation tissue T the edges. No surrounding signs of infection. o Integumentary (Hair, Skin) Wound #1 status is Open. Original cause of wound was Other Lesion. The date acquired was: 10/06/2021. The wound has been in treatment 16 weeks. The wound is located on the Left Calcaneus. The wound measures 1.4cm length x 0.8cm width x 0.1cm depth; 0.88cm^2 area and 0.088cm^3 volume. There is Fat Layer (Subcutaneous Tissue) exposed. There is no tunneling or undermining noted. There is a medium amount of serosanguineous drainage noted. The wound margin is distinct with the outline attached to the wound base. There is large (67-100%) red, pink granulation within the wound bed. There is no necrotic tissue within the wound bed. General Notes: blister open to left heel noted just below wound. Wound #2 status is Open. Original cause of wound was Blister. The date acquired was: 06/15/2022. The wound is located on the Left,Distal Calcaneus. The wound measures 3cm length x 6cm width x 0.1cm depth; 14.137cm^2 area and 1.414cm^3 volume.  There is Fat Layer (Subcutaneous Tissue) exposed. There is no tunneling or undermining noted. There is a medium amount of serosanguineous drainage noted. The wound margin is distinct with the outline attached to the wound base. There is large (67-100%) red, pink granulation within the wound bed. There is a small (1-33%) amount of necrotic tissue within the wound bed including Eschar and Adherent Slough. Assessment Active Problems ICD-10 Non-pressure chronic ulcer of other part of left foot with fat layer exposed Type 2 diabetes mellitus with foot ulcer Morbid (severe) obesity due to excess calories Patient's wounds have shown improvement in size and appearance since last clinic visit. I debrided nonviable tissue. I recommended restarting the previous skin substitute applications and epi cord #3 was placed in standard fashion today to the original left ankle wound. T the blistered wound area I recommended o continuing Xeroform. We will continue this all under 3 layer compression. Procedures Wound #1 Pre-procedure diagnosis of Wound #1 is a Diabetic Wound/Ulcer of the Lower Extremity located on the Left Calcaneus .Severity of Tissue Pre Debridement is: Fat layer exposed. There  was a Excisional Skin/Subcutaneous Tissue Debridement with a total area of 1.12 sq cm performed by Geralyn Corwin, DO. With the following instrument(s): Curette to remove Viable and Non-Viable tissue/material. Material removed includes Subcutaneous Tissue and Slough and after achieving pain control using Lidocaine 5% topical ointment. A time out was conducted at 08:55, prior to the start of the procedure. A Minimum amount of bleeding was controlled with Pressure. The procedure was tolerated well with a pain level of 0 throughout and a pain level of 0 following the procedure. Post Debridement Measurements: 1.4cm length x 0.8cm width x 0.1cm depth; 0.088cm^3 volume. Character of Wound/Ulcer Post Debridement is improved.  Severity of Tissue Post Debridement is: Fat layer exposed. Post procedure Diagnosis Wound #1: Same as Pre-Procedure Pre-procedure diagnosis of Wound #1 is a Diabetic Wound/Ulcer of the Lower Extremity located on the Left Calcaneus. A skin graft procedure using a bioengineered skin substitute/cellular or tissue based product was performed by Geralyn Corwin, DO with the following instrument(s): Forceps and Scissors. Epicord was applied and secured with Steri-Strips and adaptic. 4 sq cm of product was utilized and 2 sq cm was wasted due to wound size. Post Application, no dressing was applied. A Time Out was conducted at 09:05, prior to the start of the procedure. The procedure was tolerated well with a pain level of 0 throughout and a pain level of 0 following the procedure. Post procedure Diagnosis Wound #1: Same as Pre-Procedure . Pre-procedure diagnosis of Wound #1 is a Diabetic Wound/Ulcer of the Lower Extremity located on the Left Calcaneus . There was a Three Layer Compression Therapy Procedure by Shawn Stall, RN. Post procedure Diagnosis Wound #1: Same as Pre-Procedure Wound #2 Pre-procedure diagnosis of Wound #2 is a Diabetic Wound/Ulcer of the Lower Extremity located on the Left,Distal Calcaneus .Severity of Tissue Pre Debridement is: Fat layer exposed. There was a Excisional Skin/Subcutaneous Tissue Debridement with a total area of 18 sq cm performed by Geralyn Corwin, DO. With the following instrument(s): Curette to remove Viable and Non-Viable tissue/material. Material removed includes Subcutaneous Tissue, Slough, Skin: Dermis, and Skin: Epidermis after achieving pain control using Lidocaine 5% topical ointment. A time out was conducted at 08:55, prior to the start of the procedure. A Minimum amount of bleeding was controlled with Pressure. The procedure was tolerated well with a pain level of 0 throughout and a pain level of 0 following the procedure. Post Debridement Measurements:  3cm length x 6cm width x 0.1cm depth; 1.414cm^3 volume. Character of Wound/Ulcer Post Debridement is improved. Severity of Tissue Post Debridement is: Fat layer exposed. 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 There was a Three Layer Compression Therapy Procedure by Shawn Stall, RN. Post procedure Diagnosis Wound #2: Same as Pre-Procedure Plan Follow-up Appointments: Return Appointment in 1 week. - Dr. Mikey Bussing and Lennox Laity (Room # 7) Other: - *****FACILITY TO NOT CHANGE DRESSINGS. LEAVE IN PLACE TO LEFT LEG/HEEL WOUNDS.*** Anesthetic: (In clinic) Topical Lidocaine 5% applied to wound bed Cellular or Tissue Based Products: Cellular or Tissue Based Product Type: - Run IVR for EpiFix and Epicord=100% covered 05/07/22: Epicord ordered Cellular or Tissue Based Product applied to wound bed, secured with steri-strips, cover with Adaptic or Mepitel. (DO NOT REMOVE). - Epicord #1 05/15/25 Epicord #2 05/22/22 Epicord #3 06/29/2022 Bathing/ Shower/ Hygiene: May shower with protection but do not get wound dressing(s) wet. - May use cast protector  bag from [facility], [facility] or [employer] Edema Control - Lymphedema / SCD / Other: Elevate legs to the level of the heart or above for 30 minutes daily and/or when sitting, a frequency of: Avoid standing for long periods of time. Additional Orders / Instructions: Follow Nutritious Diet - Monitor/Control Blood Sugar WOUND #1: - Calcaneus Wound Laterality: Left Cleanser: Soap and Water 1 x Per Week/30 Days Discharge Instructions: May shower and wash wound with dial antibacterial soap and water prior to dressing change. Cleanser: Wound Cleanser 1 x Per Week/30 Days Discharge Instructions: Cleanse the wound with wound cleanser prior to applying a clean dressing using gauze sponges, not tissue or cotton balls. Peri-Wound Care:  Triamcinolone 15 (g) 1 x Per Week/30 Days Discharge Instructions: Use triamcinolone 15 (g) as directed Peri-Wound Care: Sween Lotion (Moisturizing lotion) 1 x Per Week/30 Days Discharge Instructions: Apply moisturizing lotion as directed Prim Dressing: Epicord 1 x Per Week/30 Days ary Discharge Instructions: applied by provider secured with adaptic and steri-strips. Secondary Dressing: ALLEVYN Heel 4 1/2in x 5 1/2in / 10.5cm x 13.5cm 1 x Per Week/30 Days Discharge Instructions: Apply over primary dressing as directed. Secondary Dressing: Woven Gauze Sponge, Non-Sterile 4x4 in (Home Health) 1 x Per Week/30 Days Discharge Instructions: Apply over primary dressing as directed. Com pression Wrap: ThreePress (3 layer compression wrap) (Home Health) 1 x Per Week/30 Days Discharge Instructions: Apply three layer compression as directed. WOUND #2: - Calcaneus Wound Laterality: Left, Distal Cleanser: Soap and Water 1 x Per Week/30 Days Discharge Instructions: May shower and wash wound with dial antibacterial soap and water prior to dressing change. Cleanser: Wound Cleanser 1 x Per Week/30 Days Discharge Instructions: Cleanse the wound with wound cleanser prior to applying a clean dressing using gauze sponges, not tissue or cotton balls. Peri-Wound Care: Triamcinolone 15 (g) 1 x Per Week/30 Days Discharge Instructions: Use triamcinolone 15 (g) as directed Peri-Wound Care: Sween Lotion (Moisturizing lotion) 1 x Per Week/30 Days Discharge Instructions: Apply moisturizing lotion as directed Prim Dressing: Xeroform Occlusive Gauze Dressing, 4x4 in 1 x Per Week/30 Days ary Discharge Instructions: Apply to wound bed as instructed Secondary Dressing: ALLEVYN Heel 4 1/2in x 5 1/2in / 10.5cm x 13.5cm 1 x Per Week/30 Days Discharge Instructions: Apply over primary dressing as directed. Secondary Dressing: Woven Gauze Sponge, Non-Sterile 4x4 in (Home Health) 1 x Per Week/30 Days Discharge Instructions: Apply  over primary dressing as directed. Com pression Wrap: ThreePress (3 layer compression wrap) (Home Health) 1 x Per Week/30 Days Discharge Instructions: Apply three layer compression as directed. 1. In office sharp debridement 2. Epicord #3 placed in standard fashion 3. Xeroform 4. 3 layer compression 5. Follow-up in 1 week Electronic Signature(s) Signed: 06/29/2022 10:16:22 AM By: Geralyn Corwin DO Entered By: Geralyn Corwin on 06/29/2022 09:34:45 -------------------------------------------------------------------------------- HxROS Details Patient Name: Date of Service: [name], [name]. 06/29/2022 8:45 A M Medical Record Number: [id number] Patient Account Number: [id number] Date of Birth/Sex: Treating RN: 1961-09-08 (61 y.o. F) Primary Care Provider: [name] Other Clinician: Referring Provider: Treating Provider/Extender: [name] in Treatment: 16 Information Obtained From Patient Chart Hematologic/Lymphatic Medical History: Positive for: Anemia Respiratory Medical History: Positive for: Chronic Obstructive Pulmonary Disease (COPD) Cardiovascular Medical History: Positive for: Congestive Heart Failure - Weighs self daily; Hypertension; Myocardial Infarction; Peripheral Venous Disease Gastrointestinal Medical History: Past Medical History Notes: GERD, Peptic Ulcer Disease Endocrine Medical History: Positive for: Type II Diabetes Time with diabetes: 16 years Treated with: Insulin, Oral agents Blood  sugar tested every day: Yes Tested : Twice daily Musculoskeletal Medical History: Past Medical History Notes: Broke left leg 3 years ago, 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, foot fused Neurologic Medical History: Positive for: Neuropathy Psychiatric Medical History: Past Medical History Notes: Depression Immunizations Pneumococcal Vaccine: Received Pneumococcal Vaccination: No Implantable Devices None Family and Social  History Cancer: Yes - Father,Mother; Diabetes: Yes - Mother,Maternal Grandparents,Paternal Grandparents; Heart Disease: Yes - Siblings,Maternal Grandparents; Hypertension: Yes - Siblings; Tuberculosis: Yes - Maternal Grandparents,Paternal Grandparents; Current every day smoker; Marital Status - Single; Alcohol Use: Never; Drug Use: No History; Caffeine Use: Daily; Financial Concerns: No; Food, Clothing or Shelter Needs: No; Support System Lacking: No; Transportation Concerns: No Electronic Signature(s) Signed: 06/29/2022 10:16:22 AM By: Geralyn Corwin DO Entered By: Geralyn Corwin on 06/29/2022 09:31:13 -------------------------------------------------------------------------------- SuperBill Details Patient Name: Date of Service: [name] 06/29/2022 Medical Record Number: [id number] Patient Account Number: [id number] Date of Birth/Sex: Treating RN: August 08, 1961 (60 y.o. F) Primary Care Provider: [name] Other Clinician: Referring Provider: Treating Provider/Extender: [name] in Treatment: 16 Diagnosis Coding ICD-10 Codes Code Description [phone] Non-pressure chronic ulcer of other part of left foot with fat layer exposed E11.621 Type 2 diabetes mellitus with foot ulcer E66.01 Morbid (severe) obesity due to excess calories Facility Procedures CPT4 Code: [id number] Description: Q4187 Epicord 2cm x 3cm - per sqcm Modifier: Quantity: 6 CPT4 Code: [id number] 1 Description: 5275 - SKIN SUB GRAFT FACE/NK/HF/G ICD-10 Diagnosis Description L97.522 Non-pressure chronic ulcer of other part of left foot with fat layer exposed Modifier: Quantity: 1 Physician Procedures : CPT4 Code Description Modifier [id number] - WC PHYS SKIN SUB GRAFT FACE/NK/HF/G ICD-10 Diagnosis Description L97.522 Non-pressure chronic ulcer of other part of left foot with fat layer exposed Quantity: 1 Electronic Signature(s) Signed: 06/29/2022 10:16:22 AM By:  Geralyn Corwin DO Signed: 06/29/2022 5:50:10 PM By: Shawn Stall RN, BSN Entered By: Shawn Stall on 06/29/2022 09:52:10

## 2022-06-29 NOTE — Progress Notes (Signed)
[name], [name]. ([id number]) Visit Report for 06/29/2022 Arrival Information Details Patient Name: Date of Service: [name] 06/29/2022 8:45 A M Medical Record Number: [id number] Patient Account Number: [id number] Date of Birth/Sex: Treating RN: 1960/10/12 (61 y.o. [name], [name] Primary Care Amarea Macdowell: Jenean Lindau Other Clinician: Referring Shataria Crist: Treating Tyrie Porzio/Extender: Elise Benne in Treatment: 16 Visit Information History Since Last Visit Added or deleted any medications: No Patient Arrived: Wheel Chair Any new allergies or adverse reactions: No Arrival Time: 08:32 Had a fall or experienced change in No Accompanied By: caregiver activities of daily living that may affect Transfer Assistance: Manual risk of falls: Patient Identification Verified: Yes Signs or symptoms of abuse/neglect since last visito No Secondary Verification Process Completed: Yes Hospitalized since last visit: No Patient Requires Transmission-Based Precautions: No Implantable device outside of the clinic excluding No Patient Has Alerts: No cellular tissue based products placed in the center since last visit: Has Dressing in Place as Prescribed: Yes Has Compression in Place as Prescribed: Yes Pain Present Now: Yes Electronic Signature(s) Signed: 06/29/2022 5:50:10 PM By: Deon Pilling RN, BSN Entered By: Deon Pilling on 06/29/2022 08:33:18 -------------------------------------------------------------------------------- Compression Therapy Details Patient Name: Date of Service: [name] Lax C. 06/29/2022 8:45 A M Medical Record Number: [id number] Patient Account Number: [id number] Date of Birth/Sex: Treating RN: 12-18-1960 (61 y.o. [name] Primary Care Sherley Mckenney: Jenean Lindau Other Clinician: Referring Evan Mackie: Treating Mance Vallejo/Extender: Elise Benne in Treatment: 16 Compression Therapy Performed for Wound  Assessment: Wound #1 Left Calcaneus Performed By: Clinician Deon Pilling, RN Compression Type: Three Layer Post Procedure Diagnosis Same as Pre-procedure Electronic Signature(s) Signed: 06/29/2022 5:50:10 PM By: Deon Pilling RN, BSN Entered By: Deon Pilling on 06/29/2022 09:05:07 -------------------------------------------------------------------------------- Compression Therapy Details Patient Name: Date of Service: [name] Lax C. 06/29/2022 8:45 A M Medical Record Number: [id number] Patient Account Number: [id number] Date of Birth/Sex: Treating RN: Dec 05, 1960 (61 y.o. [name] Primary Care Lille Karim: Jenean Lindau Other Clinician: Referring Amias Hutchinson: Treating Seleena Reimers/Extender: Elise Benne in Treatment: 16 Compression Therapy Performed for Wound Assessment: Wound #2 Left,Distal Calcaneus Performed By: Clinician Deon Pilling, RN Compression Type: Three Layer Post Procedure Diagnosis Same as Pre-procedure Electronic Signature(s) Signed: 06/29/2022 5:50:10 PM By: Deon Pilling RN, BSN Entered By: Deon Pilling on 06/29/2022 09:05:07 -------------------------------------------------------------------------------- Encounter Discharge Information Details Patient Name: Date of Service: [name] Lax C. 06/29/2022 8:45 A M Medical Record Number: [id number] Patient Account Number: [id number] Date of Birth/Sex: Treating RN: October 14, 1960 (61 y.o. [name], [name] Primary Care Elner Seifert: Jenean Lindau Other Clinician: Referring Joy Haegele: Treating Sathvik Tiedt/Extender: Elise Benne in Treatment: 16 Encounter Discharge Information Items Post Procedure Vitals Discharge Condition: Stable Temperature (F): 98.2 Ambulatory Status: Wheelchair Pulse (bpm): 99 Discharge Destination: [place] Respiratory Rate (breaths/min): 20 Telephoned: No Blood Pressure (mmHg): 170/75 Orders Sent:  Yes Transportation: Private Auto Accompanied By: caregiver Schedule Follow-up Appointment: Yes Clinical Summary of Care: Electronic Signature(s) Signed: 06/29/2022 5:50:10 PM By: Deon Pilling RN, BSN Entered By: Deon Pilling on 06/29/2022 09:55:01 -------------------------------------------------------------------------------- Lower Extremity Assessment Details Patient Name: Date of Service: [name] Lax C. 06/29/2022 8:45 A M Medical Record Number: [id number] Patient Account Number: [id number] Date of Birth/Sex: Treating RN: 06-02-1961 (61 y.o. [name] Primary Care Jeromie Gainor: Jenean Lindau Other Clinician: Referring Daanya Lanphier: Treating Dashanique Brownstein/Extender: Elise Benne in Treatment: 16 Edema Assessment Assessed: Shirlyn Goltz: No] Patrice Paradise: No] Edema: [Left: Ye] [Right: s] Calf Left: Right:  Point of Measurement: 28 cm From Medial Instep 49.5 cm Ankle Left: Right: Point of Measurement: 3 cm From Medial Instep 34.5 cm Vascular Assessment Pulses: Dorsalis Pedis Palpable: [Left:Yes] Electronic Signature(s) Signed: 06/29/2022 5:50:10 PM By: Deon Pilling RN, BSN Entered By: Deon Pilling on 06/29/2022 08:36:19 -------------------------------------------------------------------------------- Multi Wound Chart Details Patient Name: Date of Service: [name] Lax C. 06/29/2022 8:45 A M Medical Record Number: [id number] Patient Account Number: [id number] Date of Birth/Sex: Treating RN: [birth year] (61 y.o. F) Primary Care Georgianna Band: Jenean Lindau Other Clinician: Referring Shakeitha Umbaugh: Treating Vimal Derego/Extender: Elise Benne in Treatment: 16 Vital Signs Height(in): 76 Pulse(bpm): 99 Weight(lbs): 339 Blood Pressure(mmHg): 170/75 Body Mass Index(BMI): 54.7 Temperature(F): 98.2 Respiratory Rate(breaths/min): 20 Photos: [2:No Photos] [N/A:N/A] Left Calcaneus Left, Distal Calcaneus N/A Wound Location: Other  Lesion Blister N/A Wounding Event: Diabetic Wound/Ulcer of the Lower Diabetic Wound/Ulcer of the Lower N/A Primary Etiology: Extremity Extremity Anemia, Chronic Obstructive Anemia, Chronic Obstructive N/A Comorbid History: Pulmonary Disease (COPD), Pulmonary Disease (COPD), Congestive Heart Failure, Congestive Heart Failure, Hypertension, Myocardial Infarction, Hypertension, Myocardial Infarction, Peripheral Venous Disease, Type II Peripheral Venous Disease, Type II Diabetes, Neuropathy Diabetes, Neuropathy 10/06/2021 06/15/2022 N/A Date Acquired: 16 0 N/A Weeks of Treatment: Open Open N/A Wound Status: No No N/A Wound Recurrence: 1.4x0.8x0.1 3x6x0.1 N/A Measurements L x W x D (cm) 0.88 14.137 N/A A (cm) : rea 0.088 1.414 N/A Volume (cm) : 80.00% N/A N/A % Reduction in A rea: 90.00% N/A N/A % Reduction in Volume: Grade 2 Grade 2 N/A Classification: Medium Medium N/A Exudate A mount: Serosanguineous Serosanguineous N/A Exudate Type: red, brown red, brown N/A Exudate Color: Distinct, outline attached Distinct, outline attached N/A Wound Margin: Large (67-100%) Large (67-100%) N/A Granulation A mount: Red, Pink Red, Pink N/A Granulation Quality: None Present (0%) Small (1-33%) N/A Necrotic A mount: N/A Eschar, Adherent Slough N/A Necrotic Tissue: Fat Layer (Subcutaneous Tissue): Yes Fat Layer (Subcutaneous Tissue): Yes N/A Exposed Structures: Fascia: No Fascia: No Tendon: No Tendon: No Muscle: No Muscle: No Joint: No Joint: No Bone: No Bone: No Medium (34-66%) Small (1-33%) N/A Epithelialization: Debridement - Excisional Debridement - Excisional N/A Debridement: Pre-procedure Verification/Time Out 08:55 08:55 N/A Taken: Lidocaine 5% topical ointment Lidocaine 5% topical ointment N/A Pain Control: Subcutaneous, Slough Subcutaneous, Slough N/A Tissue Debrided: Skin/Subcutaneous Tissue Skin/Subcutaneous Tissue N/A Level: 1.12 18 N/A Debridement A (sq  cm): rea Curette Curette N/A Instrument: Minimum Minimum N/A Bleeding: Pressure Pressure N/A Hemostasis A chieved: 0 0 N/A Procedural Pain: 0 0 N/A Post Procedural Pain: Procedure was tolerated well Procedure was tolerated well N/A Debridement Treatment Response: 1.4x0.8x0.1 3x6x0.1 N/A Post Debridement Measurements L x W x D (cm) 0.088 1.414 N/A Post Debridement Volume: (cm) blister open to left heel noted just N/A N/A Assessment Notes: below wound. Cellular or Tissue Based Product Compression Therapy N/A Procedures Performed: Compression Therapy Debridement Debridement Treatment Notes Electronic Signature(s) Signed: 06/29/2022 10:16:22 AM By: Kalman Shan DO Entered By: Kalman Shan on 06/29/2022 09:30:25 -------------------------------------------------------------------------------- Multi-Disciplinary Care Plan Details Patient Name: Date of Service: [name] Lax C. 06/29/2022 8:45 A M Medical Record Number: [id number] Patient Account Number: [id number] Date of Birth/Sex: Treating RN: 01-15-1961 (61 y.o. [name] Primary Care Lucilia Yanni: Jenean Lindau Other Clinician: Referring Doshia Dalia: Treating Martese Vanatta/Extender: Elise Benne in Treatment: 16 Active Inactive Abuse / Safety / Falls / Self Care Management Nursing Diagnoses: History of Falls Goals: Patient will not experience any injury related to falls Date Initiated: 03/05/2022 Target Resolution Date: 07/23/2022  Goal Status: Active Interventions: Assess fall risk on admission and as needed Assess: immobility, friction, shearing, incontinence upon admission and as needed Assess impairment of mobility on admission and as needed per policy Notes: 2/99/24: Fall prevention ongoing, recent fall. Wound/Skin Impairment Nursing Diagnoses: Impaired tissue integrity Goals: Patient/caregiver will verbalize understanding of skin care regimen Date Initiated:  03/05/2022 Target Resolution Date: 08/28/2022 Goal Status: Active Ulcer/skin breakdown will have a volume reduction of 30% by week 4 Date Initiated: 03/05/2022 Date Inactivated: 04/30/2022 Target Resolution Date: 05/02/2022 Goal Status: Met Ulcer/skin breakdown will have a volume reduction of 50% by week 8 Date Initiated: 04/30/2022 Target Resolution Date: 07/04/2022 Goal Status: Active Interventions: Assess patient/caregiver ability to obtain necessary supplies Assess patient/caregiver ability to perform ulcer/skin care regimen upon admission and as needed Assess ulceration(s) every visit Provide education on smoking Provide education on ulcer and skin care Treatment Activities: Topical wound management initiated : 03/05/2022 Notes: 04/30/22: Wound care regimen continues. Electronic Signature(s) Signed: 06/29/2022 5:50:10 PM By: Deon Pilling RN, BSN Entered By: Deon Pilling on 06/29/2022 08:38:25 -------------------------------------------------------------------------------- Pain Assessment Details Patient Name: Date of Service: [place] C. 06/29/2022 8:45 A M Medical Record Number: [id number] Patient Account Number: [id number] Date of Birth/Sex: Treating RN: 02-May-1961 (61 y.o. [name] Primary Care : Jenean Lindau Other Clinician: Referring : Treating /Extender: Elise Benne in Treatment: 16 Active Problems Location of Pain Severity and Description of Pain Patient Has Paino Yes Site Locations Pain Location: Pain Location: Generalized Pain, Pain in Ulcers Rate the pain. Current Pain Level: 7 Pain Management and Medication Current Pain Management: Medication: No Cold Application: No Rest: No Massage: No Activity: No T.E.N.S.: No Heat Application: No Leg drop or elevation: No Is the Current Pain Management Adequate: Adequate How does your wound impact your activities of daily livingo Sleep:  No Bathing: No Appetite: No Relationship With Others: No Bladder Continence: No Emotions: No Bowel Continence: No Work: No Toileting: No Drive: No Dressing: No Hobbies: No Engineer, maintenance) Signed: 06/29/2022 5:50:10 PM By: Deon Pilling RN, BSN Entered By: Deon Pilling on 06/29/2022 08:33:46 -------------------------------------------------------------------------------- Patient/Caregiver Education Details Patient Name: Date of Service: [name]. 9/25/2023andnbsp8:45 A M Medical Record Number: [id number] Patient Account Number: [id number] Date of Birth/Gender: Treating RN: December 21, 1960 (61 y.o. [name] Primary Care Physician: Jenean Lindau Other Clinician: Referring Physician: Treating Physician/Extender: Elise Benne in Treatment: 16 Education Assessment Education Provided To: Patient Education Topics Provided Wound/Skin Impairment: Handouts: Skin Care Do's and Dont's Methods: Explain/Verbal Responses: Reinforcements needed Electronic Signature(s) Signed: 06/29/2022 5:50:10 PM By: Deon Pilling RN, BSN Entered By: Deon Pilling on 06/29/2022 08:38:37 -------------------------------------------------------------------------------- Wound Assessment Details Patient Name: Date of Service: [name] Lax C. 06/29/2022 8:45 A M Medical Record Number: [id number] Patient Account Number: [id number] Date of Birth/Sex: Treating RN: 21-Dec-1960 (61 y.o. [name], [name] Primary Care : Jenean Lindau Other Clinician: Referring : Treating /Extender: Elise Benne in Treatment: 16 Wound Status Wound Number: 1 Primary Diabetic Wound/Ulcer of the Lower Extremity Etiology: Wound Location: Left Calcaneus Wound Open Wounding Event: Other Lesion Status: Date Acquired: 10/06/2021 Comorbid Anemia, Chronic Obstructive Pulmonary Disease (COPD), Weeks Of Treatment: 16 History:  Congestive Heart Failure, Hypertension, Myocardial Infarction, Clustered Wound: No Peripheral Venous Disease, Type II Diabetes, Neuropathy Photos Wound Measurements Length: (cm) 1.4 Width: (cm) 0.8 Depth: (cm) 0.1 Area: (cm) 0.88 Volume: (cm) 0.088 % Reduction in Area: 80% % Reduction in Volume: 90% Epithelialization: Medium (34-66%)  Tunneling: No Undermining: No Wound Description Classification: Grade 2 Wound Margin: Distinct, outline attached Exudate Amount: Medium Exudate Type: Serosanguineous Exudate Color: red, brown Foul Odor After Cleansing: No Slough/Fibrino No Wound Bed Granulation Amount: Large (67-100%) Exposed Structure Granulation Quality: Red, Pink Fascia Exposed: No Necrotic Amount: None Present (0%) Fat Layer (Subcutaneous Tissue) Exposed: Yes Tendon Exposed: No Muscle Exposed: No Joint Exposed: No Bone Exposed: No Assessment Notes blister open to left heel noted just below wound. Treatment Notes Wound #1 (Calcaneus) Wound Laterality: Left Cleanser Soap and Water Discharge Instruction: May shower and wash wound with dial antibacterial soap and water prior to dressing change. Wound Cleanser Discharge Instruction: Cleanse the wound with wound cleanser prior to applying a clean dressing using gauze sponges, not tissue or cotton balls. Peri-Wound Care Triamcinolone 15 (g) Discharge Instruction: Use triamcinolone 15 (g) as directed Sween Lotion (Moisturizing lotion) Discharge Instruction: Apply moisturizing lotion as directed Topical Primary Dressing Epicord Discharge Instruction: applied by  secured with adaptic and steri-strips. Secondary Dressing ALLEVYN Heel 4 1/2in x 5 1/2in / 10.5cm x 13.5cm Discharge Instruction: Apply over primary dressing as directed. Woven Gauze Sponge, Non-Sterile 4x4 in Discharge Instruction: Apply over primary dressing as directed. Secured With Compression Wrap ThreePress (3 layer compression wrap) Discharge  Instruction: Apply three layer compression as directed. Compression Stockings Add-Ons Electronic Signature(s) Signed: 06/29/2022 5:50:10 PM By: Deon Pilling RN, BSN Entered By: Deon Pilling on 06/29/2022 09:00:42 -------------------------------------------------------------------------------- Wound Assessment Details Patient Name: Date of Service: [name] Lax C. 06/29/2022 8:45 A M Medical Record Number: [id number] Patient Account Number: [id number] Date of Birth/Sex: Treating RN: 07-Jul-1961 (61 y.o. [name], [name] Primary Care : Jenean Lindau Other Clinician: Referring : Treating /Extender: Elise Benne in Treatment: 16 Wound Status Wound Number: 2 Primary Diabetic Wound/Ulcer of the Lower Extremity Etiology: Wound Location: Left, Distal Calcaneus Wound Open Wounding Event: Blister Status: Date Acquired: 06/15/2022 Comorbid Anemia, Chronic Obstructive Pulmonary Disease (COPD), Weeks Of Treatment: 0 History: Congestive Heart Failure, Hypertension, Myocardial Infarction, Clustered Wound: No Peripheral Venous Disease, Type II Diabetes, Neuropathy Wound Measurements Length: (cm) 3 Width: (cm) 6 Depth: (cm) 0.1 Area: (cm) 14.137 Volume: (cm) 1.414 % Reduction in Area: % Reduction in Volume: Epithelialization: Small (1-33%) Tunneling: No Undermining: No Wound Description Classification: Grade 2 Wound Margin: Distinct, outline attached Exudate Amount: Medium Exudate Type: Serosanguineous Exudate Color: red, brown Foul Odor After Cleansing: No Slough/Fibrino Yes Wound Bed Granulation Amount: Large (67-100%) Exposed Structure Granulation Quality: Red, Pink Fascia Exposed: No Necrotic Amount: Small (1-33%) Fat Layer (Subcutaneous Tissue) Exposed: Yes Necrotic Quality: Eschar, Adherent Slough Tendon Exposed: No Muscle Exposed: No Joint Exposed: No Bone Exposed: No Treatment Notes Wound #2 (Calcaneus)  Wound Laterality: Left, Distal Cleanser Soap and Water Discharge Instruction: May shower and wash wound with dial antibacterial soap and water prior to dressing change. Wound Cleanser Discharge Instruction: Cleanse the wound with wound cleanser prior to applying a clean dressing using gauze sponges, not tissue or cotton balls. Peri-Wound Care Triamcinolone 15 (g) Discharge Instruction: Use triamcinolone 15 (g) as directed Sween Lotion (Moisturizing lotion) Discharge Instruction: Apply moisturizing lotion as directed Topical Primary Dressing Xeroform Occlusive Gauze Dressing, 4x4 in Discharge Instruction: Apply to wound bed as instructed Secondary Dressing ALLEVYN Heel 4 1/2in x 5 1/2in / 10.5cm x 13.5cm Discharge Instruction: Apply over primary dressing as directed. Woven Gauze Sponge, Non-Sterile 4x4 in Discharge Instruction: Apply over primary dressing as directed. Secured With Compression Wrap ThreePress (3 layer compression wrap) Discharge Instruction: Apply  three layer compression as directed. Compression Stockings Add-Ons Electronic Signature(s) Signed: 06/29/2022 5:50:10 PM By: Deon Pilling RN, BSN Entered By: Deon Pilling on 06/29/2022 09:02:12 -------------------------------------------------------------------------------- Vitals Details Patient Name: Date of Service: [name] Lax C. 06/29/2022 8:45 A M Medical Record Number: [id number] Patient Account Number: [id number] Date of Birth/Sex: Treating RN: [birth year] (61 y.o. [name], [name] Primary Care : Jenean Lindau Other Clinician: Referring : Treating /Extender: Elise Benne in Treatment: 16 Vital Signs Time Taken: 08:30 Temperature (F): 98.2 Height (in): 66 Pulse (bpm): 99 Weight (lbs): 339 Respiratory Rate (breaths/min): 20 Body Mass Index (BMI): 54.7 Blood Pressure (mmHg): 170/75 Reference Range: 80 - 120 mg / dl Notes per patient did not  take morning BP medications. Electronic Signature(s) Signed: 06/29/2022 5:50:10 PM By: Deon Pilling RN, BSN Entered By: Deon Pilling on 06/29/2022 08:34:02

## 2022-07-07 ENCOUNTER — Encounter (HOSPITAL_BASED_OUTPATIENT_CLINIC_OR_DEPARTMENT_OTHER): Payer: 59 | Attending: Internal Medicine | Admitting: Internal Medicine

## 2022-07-07 DIAGNOSIS — E11621 Type 2 diabetes mellitus with foot ulcer: Secondary | ICD-10-CM | POA: Insufficient documentation

## 2022-07-07 DIAGNOSIS — L97522 Non-pressure chronic ulcer of other part of left foot with fat layer exposed: Secondary | ICD-10-CM | POA: Diagnosis not present

## 2022-07-07 DIAGNOSIS — Z6841 Body Mass Index (BMI) 40.0 and over, adult: Secondary | ICD-10-CM | POA: Insufficient documentation

## 2022-07-09 NOTE — Progress Notes (Signed)
Dahmen, Katasha C. ([id number]) Visit Report for 07/07/2022 Chief Complaint Document Details Patient Name: Date of Service: [name] 07/07/2022 11:00 A M Medical Record Number: [id number] Patient Account Number: [id number] Date of Birth/Sex: Treating RN: [birth year] (61 y.o. F) Primary Care Provider: [name] Other Clinician: Referring Provider: Treating Provider/Extender: [name] in Treatment: 17 Information Obtained from: Patient Chief Complaint 03/05/2022; left foot wound Electronic Signature(s) Signed: 07/07/2022 4:41:45 PM By: Geralyn Corwin DO Entered By: Geralyn Corwin on 07/07/2022 12:46:55 -------------------------------------------------------------------------------- Cellular or Tissue Based Product Details Patient Name: Date of Service: [name] 07/07/2022 11:00 A M Medical Record Number: [id number] Patient Account Number: [id number] Date of Birth/Sex: Treating RN: Nov 09, 1960 (61 y.o. [name], [name] Primary Care Provider: [name] Other Clinician: Referring Provider: Treating Provider/Extender: [name] in Treatment: 17 Cellular or Tissue Based Product Type Wound #1 Left Calcaneus Applied to: Performed By: Physician Geralyn Corwin, DO Cellular or Tissue Based Product Type: Epicord Level of Consciousness (Pre-procedure): Awake and Alert Pre-procedure Verification/Time Out Yes - 11:56 Taken: Location: genitalia / hands / feet / multiple digits Wound Size (sq cm): 2.1 Product Size (sq cm): 3 Waste Size (sq cm): 1 Waste Reason: wound size Amount of Product Applied (sq cm): 2 Instrument Used: Forceps, Scissors Lot #: [id number] Order #: 4 Expiration Date: 02/03/2027 Fenestrated: No Reconstituted: Yes Solution Type: normal saline Solution Amount: 41mL Lot #: [id number] Expiration Date: 02/01/2025 Secured: Yes Secured With: Steri-Strips,  adaptic Dressing Applied: No Procedural Pain: 0 Post Procedural Pain: 0 Response to Treatment: Procedure was tolerated well Level of Consciousness (Post- Awake and Alert procedure): Post Procedure Diagnosis Same as Pre-procedure Electronic Signature(s) Signed: 07/07/2022 4:41:45 PM By: Geralyn Corwin DO Signed: 07/09/2022 5:16:03 PM By: Shawn Stall RN, BSN Entered By: Shawn Stall on 07/07/2022 12:02:41 -------------------------------------------------------------------------------- Cellular or Tissue Based Product Details Patient Name: Date of Service: [name] 07/07/2022 11:00 A M Medical Record Number: [id number] Patient Account Number: [id number] Date of Birth/Sex: Treating RN: 1961-01-18 (61 y.o. [name], [name] Primary Care Provider: [name] Other Clinician: Referring Provider: Treating Provider/Extender: [name] in Treatment: 17 Cellular or Tissue Based Product Type Wound #2 Left,Distal Calcaneus Applied to: Performed By: Physician Geralyn Corwin, DO Cellular or Tissue Based Product Type: Epicord Level of Consciousness (Pre-procedure): Awake and Alert Pre-procedure Verification/Time Out Yes - 11:56 Taken: Location: genitalia / hands / feet / multiple digits Wound Size (sq cm): 8 Product Size (sq cm): 3 Waste Size (sq cm): 2 Waste Reason: wound size Amount of Product Applied (sq cm): 1 Instrument Used: Forceps, Scissors Lot #: [id number] Order #: 4 Expiration Date: 02/03/2027 Fenestrated: No Reconstituted: Yes Solution Type: normal saline Solution Amount: 68mL Lot #: [id number] Solution Expiration Date: 02/01/2025 Secured: Yes Secured With: Steri-Strips, adaptic Dressing Applied: No Procedural Pain: 0 Post Procedural Pain: 0 Response to Treatment: Procedure was tolerated well Level of Consciousness (Post- Awake and Alert procedure): Post Procedure Diagnosis Same as Pre-procedure Electronic  Signature(s) Signed: 07/07/2022 4:41:45 PM By: Geralyn Corwin DO Signed: 07/09/2022 5:16:03 PM By: Shawn Stall RN, BSN Entered By: Shawn Stall on 07/07/2022 12:03:10 -------------------------------------------------------------------------------- Debridement Details Patient Name: Date of Service: [name] 07/07/2022 11:00 A M Medical Record Number: [id number] Patient Account Number: [id number] Date of Birth/Sex: Treating RN: 10-07-1960 (61 y.o. [name], [name] Primary Care Provider: [name] Other Clinician: Referring Provider: Treating Provider/Extender: [name] in Treatment: [phone]  Debridement Performed for Assessment: Wound #1 Left Calcaneus Performed By: Physician Geralyn Corwin, DO Debridement Type: Debridement Severity of Tissue Pre Debridement: Fat layer exposed Level of Consciousness (Pre-procedure): Awake and Alert Pre-procedure Verification/Time Out Yes - 11:40 Taken: Start Time: 11:41 Pain Control: Lidocaine 5% topical ointment T Area Debrided (L x W): otal 1.5 (cm) x 1.5 (cm) = 2.25 (cm) Tissue and other material debrided: Viable, Non-Viable, Slough, Subcutaneous, Slough Level: Skin/Subcutaneous Tissue Debridement Description: Excisional Instrument: Curette Bleeding: Minimum Hemostasis Achieved: Pressure End Time: 11:55 Procedural Pain: 0 Post Procedural Pain: 0 Response to Treatment: Procedure was tolerated well Level of Consciousness (Post- Awake and Alert procedure): Post Debridement Measurements of Total Wound Length: (cm) 1.5 Width: (cm) 2.5 Depth: (cm) 0.1 Volume: (cm) 0.295 Character of Wound/Ulcer Post Debridement: Improved Severity of Tissue Post Debridement: Fat layer exposed Post Procedure Diagnosis Same as Pre-procedure Electronic Signature(s) Signed: 07/07/2022 4:41:45 PM By: Geralyn Corwin DO Signed: 07/09/2022 5:16:03 PM By: Shawn Stall RN, BSN Entered By: Shawn Stall on  07/07/2022 12:00:00 -------------------------------------------------------------------------------- Debridement Details Patient Name: Date of Service: [name] 07/07/2022 11:00 A M Medical Record Number: [id number] Patient Account Number: [id number] Date of Birth/Sex: Treating RN: [birth year] (61 y.o. [name], [name] Primary Care Provider: [name] Other Clinician: Referring Provider: Treating Provider/Extender: [name] in Treatment: 17 Debridement Performed for Assessment: Wound #2 Left,Distal Calcaneus Performed By: Physician Geralyn Corwin, DO Debridement Type: Debridement Severity of Tissue Pre Debridement: Fat layer exposed Level of Consciousness (Pre-procedure): Awake and Alert Pre-procedure Verification/Time Out Yes - 11:40 Yes - 11:40 Taken: Start Time: 11:41 Pain Control: Lidocaine 5% topical ointment T Area Debrided (L x W): otal 2 (cm) x 4 (cm) = 8 (cm) Tissue and other material debrided: Viable, Non-Viable, Slough, Subcutaneous, Slough Level: Skin/Subcutaneous Tissue Debridement Description: Excisional Instrument: Curette Bleeding: Minimum Hemostasis Achieved: Pressure End Time: 11:55 Procedural Pain: 0 Post Procedural Pain: 0 Response to Treatment: Procedure was tolerated well Level of Consciousness (Post- Awake and Alert procedure): Post Debridement Measurements of Total Wound Length: (cm) 2 Width: (cm) 4 Depth: (cm) 0.1 Volume: (cm) 0.628 Character of Wound/Ulcer Post Debridement: Improved Severity of Tissue Post Debridement: Fat layer exposed Post Procedure Diagnosis Same as Pre-procedure Electronic Signature(s) Signed: 07/07/2022 4:41:45 PM By: Geralyn Corwin DO Signed: 07/09/2022 5:16:03 PM By: Shawn Stall RN, BSN Entered By: Shawn Stall on 07/07/2022 12:00:15 -------------------------------------------------------------------------------- HPI Details Patient Name: Date of  Service: [name], [name]. 07/07/2022 11:00 A M Medical Record Number: [id number] Patient Account Number: [id number] Date of Birth/Sex: Treating RN: [birth year] (61 y.o. F) Primary Care Provider: [name] Other Clinician: Referring Provider: Treating Provider/Extender: [name] in Treatment: 17 History of Present Illness HPI Description: Admission 03/05/2022 Ms. Amenda Hancox is a 61 year old female with a past medical history of uncontrolled insulin-dependent type 2 diabetes, tobacco user and chronic diastolic heart failure that presents to the clinic for a 22-month history of wound to her left heel. She states she had a left ankle fusion in September 2022. She states that she always had a wound after the surgery and it never healed. She has home health and they have been doing compression wraps along with silver alginate to the wound bed. She currently denies signs of infection. 6/8; this is a 61 year old woman with type 2 diabetes. She developed a wound on her left Achilles heel just above the tip of the heel in the setting of recurrent ankle surgeries in late 2022. I have  seen some of these results from either the cast or the surgical boots that are put on after these operations. She thinks this may be the case. And a sense of pressure ulcer. In any case that she has been using Santyl Hydrofera Blue under compression. She is not wearing any footwear at home as she cannot find anything to accommodate the wrap 6/22; patient presents for follow-up. She has home health that comes out once a week to help with dressing changes. She has no issues or complaints today. We have been using Hydrofera Blue under compression therapy. 7/6; patient presents for follow-up. She states that home health did not come out for the past 2 weeks. It is unclear why. We have been using Hydrofera Blue and Santyl under compression therapy. 7/13; patient presents for follow-up. She did  not take the oral antibiotics prescribed at last clinic visit. We have been using Hydrofera Blue with gentamicin/mupirocin ointment under compression therapy. She has no issues or complaints today. She denies signs of infection. 7/20; patient presents for follow-up. We have been using Hydrofera Blue with antibiotic ointment under 3 layer compression. She has home health that change the dressing once. They put collagen on the wound bed. She also reports falling yesterday and hitting her right foot. She has no pain to the area today. 7/27; patient presents for follow-up. We have been using Hydrofera Blue under 3 layer compression. She has home health that changes the dressing. She has no issues or complaints today. 8/3; patient presents for follow-up. We continues to use Hydrofera Blue under 3 layer compression. She has no issues or complaints today. She has been approved for Epicord at 100%. 8/11; patient presents for follow-up. We have been using Hydrofera Blue under 3 layer compression. She has been approved for Epicord and we have this today. She is in agreement with having this applied. 8/18; patient presents for follow-up. Epicord #1 was placed in standard fashion last clinic visit. She has no issues or complaints today. 9/18; Unfortunately patient has missed her last clinic appointments due to falling and breaking her right ankle. We have been following her for her left ankle wound. She has also developed a large blister to the left heel over the past week that has ruptured and dried. She currently resides In [facility]. Wound9/25; patient presents for follow-up. We have been using Hydrofera Blue and Santyl to the original left ankle wound and Xeroform to the dried blistered. We have been wrapping her with compression therapy. She resides in a facility and they changed the wrap once this past week. She has no issues or complaints today. She denies signs of infection. 10/3; patient presents  for follow-up. We have been using Xeroform to the heel wound and Epicort to the left ankle wound All under compression therapy. She has no issues or complaints today. Electronic Signature(s) Signed: 07/07/2022 4:41:45 PM By: Geralyn Corwin DO Entered By: Geralyn Corwin on 07/07/2022 12:48:14 -------------------------------------------------------------------------------- Physical Exam Details Patient Name: Date of Service: Sharol Harness MY C. 07/07/2022 11:00 A M Medical Record Number: [id number] Patient Account Number: [id number] Date of Birth/Sex: Treating RN: [birth year] (61 y.o. F) Primary Care Provider: [name] Other Clinician: Referring Provider: Treating Provider/Extender: [name] in Treatment: 17 Constitutional respirations regular, non-labored and within target range for patient.. Cardiovascular 2+ dorsalis pedis/posterior tibialis pulses. Psychiatric pleasant and cooperative. Notes Left foot: T the posterior ankle and heel there is open wounds with granulation tissue and nonviable tissue. No surrounding  signs of infection. o Electronic Signature(s) Signed: 07/07/2022 4:41:45 PM By: Geralyn Corwin DO Entered By: Geralyn Corwin on 07/07/2022 12:49:16 -------------------------------------------------------------------------------- Physician Orders Details Patient Name: Date of Service: [name] 07/07/2022 11:00 A M Medical Record Number: [id number] Patient Account Number: [id number] Date of Birth/Sex: Treating RN: [birth year] (61 y.o. [name], [name] Primary Care Provider: [name] Other Clinician: Referring Provider: Treating Provider/Extender: [name] in Treatment: 17 Verbal / Phone Orders: No Diagnosis Coding ICD-10 Coding Code Description [phone] Non-pressure chronic ulcer of other part of left foot with fat layer exposed E11.621 Type 2 diabetes mellitus with foot  ulcer E66.01 Morbid (severe) obesity due to excess calories Follow-up Appointments ppointment in 1 week. - Dr. Mikey Bussing and [name], Room 8 Return A Other: - *****FACILITY TO NOT CHANGE DRESSINGS. LEAVE IN PLACE TO LEFT LEG/HEEL WOUNDS.*** Anesthetic (In clinic) Topical Lidocaine 5% applied to wound bed Cellular or Tissue Based Products Cellular or Tissue Based Product Type: - Run IVR for EpiFix and Epicord=100% covered 05/07/22: Epicord ordered daptic or Mepitel. (DO NOT REMOVE). - Cellular or Tissue Based Product applied to wound bed, secured with steri-strips, cover with A Epicord #1 05/15/25 Epicord #2 05/22/22 Epicord #3 06/29/2022 Epicord #4 07/07/2022 Bathing/ Shower/ Hygiene May shower with protection but do not get wound dressing(s) wet. - May use cast protector bag from [facility], [facility] or [employer] Edema Control - Lymphedema / SCD / Other Elevate legs to the level of the heart or above for 30 minutes daily and/or when sitting, a frequency of: Avoid standing for long periods of time. Off-Loading Prevalon Boot - use while in bed and chair. Additional Orders / Instructions Follow Nutritious Diet - Monitor/Control Blood Sugar Wound Treatment Wound #1 - Calcaneus Wound Laterality: Left Cleanser: Soap and Water 1 x Per Week/30 Days Discharge Instructions: May shower and wash wound with dial antibacterial soap and water prior to dressing change. Cleanser: Wound Cleanser 1 x Per Week/30 Days Discharge Instructions: Cleanse the wound with wound cleanser prior to applying a clean dressing using gauze sponges, not tissue or cotton balls. Peri-Wound Care: Triamcinolone 15 (g) 1 x Per Week/30 Days Discharge Instructions: Use triamcinolone 15 (g) as directed Peri-Wound Care: Sween Lotion (Moisturizing lotion) 1 x Per Week/30 Days Discharge Instructions: Apply moisturizing lotion as directed Prim Dressing: Epicord 1 x Per Week/30 Days ary Discharge Instructions: applied by provider secured with  adaptic and steri-strips. Secondary Dressing: ALLEVYN Heel 4 1/2in x 5 1/2in / 10.5cm x 13.5cm 1 x Per Week/30 Days Discharge Instructions: Apply over primary dressing as directed. Secondary Dressing: Woven Gauze Sponge, Non-Sterile 4x4 in (Home Health) 1 x Per Week/30 Days Discharge Instructions: Apply over primary dressing as directed. Compression Wrap: ThreePress (3 layer compression wrap) (Home Health) 1 x Per Week/30 Days Discharge Instructions: Apply three layer compression as directed. Wound #2 - Calcaneus Wound Laterality: Left, Distal Cleanser: Soap and Water 1 x Per Week/30 Days Discharge Instructions: May shower and wash wound with dial antibacterial soap and water prior to dressing change. Cleanser: Wound Cleanser 1 x Per Week/30 Days Discharge Instructions: Cleanse the wound with wound cleanser prior to applying a clean dressing using gauze sponges, not tissue or cotton balls. Peri-Wound Care: Triamcinolone 15 (g) 1 x Per Week/30 Days Discharge Instructions: Use triamcinolone 15 (g) as directed Peri-Wound Care: Sween Lotion (Moisturizing lotion) 1 x Per Week/30 Days Discharge Instructions: Apply moisturizing lotion as directed Prim Dressing: Epicord placed 1/2 the wound 1 x Per Week/30 Days ary  Discharge Instructions: placed by provider. Prim Dressing: Santyl and Hydrofera Blue the other1/2 of wound ary 1 x Per Week/30 Days Discharge Instructions: placed by nurse. Secondary Dressing: ALLEVYN Heel 4 1/2in x 5 1/2in / 10.5cm x 13.5cm 1 x Per Week/30 Days Discharge Instructions: Apply over primary dressing as directed. Secondary Dressing: Woven Gauze Sponge, Non-Sterile 4x4 in (Home Health) 1 x Per Week/30 Days Discharge Instructions: Apply over primary dressing as directed. Compression Wrap: ThreePress (3 layer compression wrap) (Home Health) 1 x Per Week/30 Days Discharge Instructions: Apply three layer compression as directed. Electronic Signature(s) Signed: 07/07/2022  4:41:45 PM By: Kalman Shan DO Entered By: Kalman Shan on 07/07/2022 12:49:44 -------------------------------------------------------------------------------- Problem List Details Patient Name: Date of Service: [name] 07/07/2022 11:00 A M Medical Record Number: [id number] Patient Account Number: [id number] Date of Birth/Sex: Treating RN: 08-29-1961 (61 y.o. [name], [name] Primary Care Provider: Jenean Lindau Other Clinician: Referring Provider: Treating Provider/Extender: Elise Benne in Treatment: 17 Active Problems ICD-10 Encounter Code Description Active Date MDM Diagnosis [phone] Non-pressure chronic ulcer of other part of left foot with fat layer exposed 03/05/2022 No Yes E11.621 Type 2 diabetes mellitus with foot ulcer 03/05/2022 No Yes E66.01 Morbid (severe) obesity due to excess calories 03/05/2022 No Yes Inactive Problems Resolved Problems Electronic Signature(s) Signed: 07/07/2022 4:41:45 PM By: Kalman Shan DO Entered By: Kalman Shan on 07/07/2022 12:46:36 -------------------------------------------------------------------------------- Progress Note Details Patient Name: Date of Service: [name] 07/07/2022 11:00 A M Medical Record Number: [id number] Patient Account Number: [id number] Date of Birth/Sex: Treating RN: 08/15/1961 (61 y.o. F) Primary Care Provider: Jenean Lindau Other Clinician: Referring Provider: Treating Provider/Extender: Elise Benne in Treatment: 17 Subjective Chief Complaint Information obtained from Patient 03/05/2022; left foot wound History of Present Illness (HPI) Admission 03/05/2022 Ms. Lyne Burkemper is a 61 year old female with a past medical history of uncontrolled insulin-dependent type 2 diabetes, tobacco user and chronic diastolic heart failure that presents to the clinic for a 61-month history of wound to her left heel. She states she had  a left ankle fusion in September 2022. She states that she always had a wound after the surgery and it never healed. She has home health and they have been doing compression wraps along with silver alginate to the wound bed. She currently denies signs of infection. 6/8; this is a 61 year old woman with type 2 diabetes. She developed a wound on her left Achilles heel just above the tip of the heel in the setting of recurrent ankle surgeries in late 2022. I have seen some of these results from either the cast or the surgical boots that are put on after these operations. She thinks this may be the case. And a sense of pressure ulcer. In any case that she has been using Santyl Hydrofera Blue under compression. She is not wearing any footwear at home as she cannot find anything to accommodate the wrap 6/22; patient presents for follow-up. She has home health that comes out once a week to help with dressing changes. She has no issues or complaints today. We have been using Hydrofera Blue under compression therapy. 7/6; patient presents for follow-up. She states that home health did not come out for the past 2 weeks. It is unclear why. We have been using Hydrofera Blue and Santyl under compression therapy. 7/13; patient presents for follow-up. She did not take the oral antibiotics prescribed at last clinic visit. We have been using Hydrofera Blue with  gentamicin/mupirocin ointment under compression therapy. She has no issues or complaints today. She denies signs of infection. 7/20; patient presents for follow-up. We have been using Hydrofera Blue with antibiotic ointment under 3 layer compression. She has home health that change the dressing once. They put collagen on the wound bed. She also reports falling yesterday and hitting her right foot. She has no pain to the area today. 7/27; patient presents for follow-up. We have been using Hydrofera Blue under 3 layer compression. She has home health that changes  the dressing. She has no issues or complaints today. 8/3; patient presents for follow-up. We continues to use Hydrofera Blue under 3 layer compression. She has no issues or complaints today. She has been approved for Epicord at 100%. 8/11; patient presents for follow-up. We have been using Hydrofera Blue under 3 layer compression. She has been approved for Epicord and we have this today. She is in agreement with having this applied. 8/18; patient presents for follow-up. Epicord #1 was placed in standard fashion last clinic visit. She has no issues or complaints today. 9/18; Unfortunately patient has missed her last clinic appointments due to falling and breaking her right ankle. We have been following her for her left ankle wound. She has also developed a large blister to the left heel over the past week that has ruptured and dried. She currently resides In [facility]. Wound9/25; patient presents for follow-up. We have been using Hydrofera Blue and Santyl to the original left ankle wound and Xeroform to the dried blistered. We have been wrapping her with compression therapy. She resides in a facility and they changed the wrap once this past week. She has no issues or complaints today. She denies signs of infection. 10/3; patient presents for follow-up. We have been using Xeroform to the heel wound and Epicort to the left ankle wound All under compression therapy. She has no issues or complaints today. Patient History Information obtained from Patient, Chart. Family History Cancer - Father,Mother, Diabetes - Mother,Maternal Grandparents,Paternal Grandparents, Heart Disease - Siblings,Maternal Grandparents, Hypertension - Siblings, Tuberculosis - Maternal Grandparents,Paternal Grandparents. Social History Current every day smoker, Marital Status - Single, Alcohol Use - Never, Drug Use - No History, Caffeine Use - Daily. Medical History Hematologic/Lymphatic Patient has history of  Anemia Respiratory Patient has history of Chronic Obstructive Pulmonary Disease (COPD) Cardiovascular Patient has history of Congestive Heart Failure - Weighs self daily, Hypertension, Myocardial Infarction, Peripheral Venous Disease Endocrine Patient has history of Type II Diabetes Neurologic Patient has history of Neuropathy Medical A Surgical History Notes nd Gastrointestinal GERD, Peptic Ulcer Disease Musculoskeletal Broke left leg 3 years ago, 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 Vitals Time Taken: 11:01 AM, Height: 66 in, Weight: 339 lbs, BMI: 54.7, Temperature: 98.3 F, Pulse: 99 bpm, Respiratory Rate: 20 breaths/min, Blood Pressure: 165/82 mmHg. Cardiovascular 2+ dorsalis pedis/posterior tibialis pulses. Psychiatric pleasant and cooperative. General Notes: Left foot: T the posterior ankle and heel there is open wounds with granulation tissue and nonviable tissue. No surrounding signs of infection. o Integumentary (Hair, Skin) Wound #1 status is Open. Original cause of wound was Other Lesion. The date acquired was: 10/06/2021. The wound has been in treatment 17 weeks. The wound is located on the Left Calcaneus. The wound measures 1.5cm length x 1.4cm width x 0.1cm depth; 1.649cm^2 area and 0.165cm^3 volume. There is Fat Layer (Subcutaneous Tissue) exposed. There is no tunneling or undermining noted. There  is a medium amount of serosanguineous drainage noted. The wound margin is distinct with the outline attached to the wound base. There is large (67-100%) red, pink granulation within the wound bed. There is no necrotic tissue within the wound bed. The periwound skin appearance had no abnormalities noted for moisture. The periwound skin appearance had no abnormalities noted for color. The periwound skin appearance exhibited: Callus. The periwound skin appearance  did not exhibit: Crepitus, Excoriation, Induration, Rash, Scarring. Periwound temperature was noted as No Abnormality. Wound #2 status is Open. Original cause of wound was Blister. The date acquired was: 06/15/2022. The wound has been in treatment 1 weeks. The wound is located on the Left,Distal Calcaneus. The wound measures 2cm length x 4cm width x 0.1cm depth; 6.283cm^2 area and 0.628cm^3 volume. There is Fat Layer (Subcutaneous Tissue) exposed. There is no tunneling or undermining noted. There is a medium amount of serosanguineous drainage noted. The wound margin is distinct with the outline attached to the wound base. There is large (67-100%) red, pink granulation within the wound bed. There is a small (1-33%) amount of necrotic tissue within the wound bed including Eschar and Adherent Slough. The periwound skin appearance had no abnormalities noted for texture. The periwound skin appearance had no abnormalities noted for moisture. The periwound skin appearance had no abnormalities noted for color. Periwound temperature was noted as No Abnormality. Assessment Active Problems ICD-10 Non-pressure chronic ulcer of other part of left foot with fat layer exposed Type 2 diabetes mellitus with foot ulcer Morbid (severe) obesity due to excess calories Patient's wounds appear well-healing. I debrided nonviable tissue. Epicord #4 was placed in standard fashion today to the ankle wound and part of the heel wound. T the remaining heel wound I recommended Santyl and Hydrofera Blue. We will continue 3 layer compression. Follow-up in 1 week o Procedures Wound #1 Pre-procedure diagnosis of Wound #1 is a Diabetic Wound/Ulcer of the Lower Extremity located on the Left Calcaneus .Severity of Tissue Pre Debridement is: Fat layer exposed. There was a Excisional Skin/Subcutaneous Tissue Debridement with a total area of 2.25 sq cm performed by Geralyn CorwinHoffman, Kaycee Mcgaugh, DO. With the following instrument(s): Curette to  remove Viable and Non-Viable tissue/material. Material removed includes Subcutaneous Tissue and Slough and after achieving pain control using Lidocaine 5% topical ointment. A time out was conducted at 11:40, prior to the start of the procedure. A Minimum amount of bleeding was controlled with Pressure. The procedure was tolerated well with a pain level of 0 throughout and a pain level of 0 following the procedure. Post Debridement Measurements: 1.5cm length x 2.5cm width x 0.1cm depth; 0.295cm^3 volume. Character of Wound/Ulcer Post Debridement is improved. Severity of Tissue Post Debridement is: Fat layer exposed. Post procedure Diagnosis Wound #1: Same as Pre-Procedure Pre-procedure diagnosis of Wound #1 is a Diabetic Wound/Ulcer of the Lower Extremity located on the Left Calcaneus. A skin graft procedure using a bioengineered skin substitute/cellular or tissue based product was performed by Geralyn CorwinHoffman, Gizel Riedlinger, DO with the following instrument(s): Forceps and Scissors. Epicord was applied and secured with Steri-Strips and adaptic. 2 sq cm of product was utilized and 1 sq cm was wasted due to wound size. Post Application, no dressing was applied. A Time Out was conducted at 11:56, prior to the start of the procedure. The procedure was tolerated well with a pain level of 0 throughout and a pain level of 0 following the procedure. Post procedure Diagnosis Wound #1: Same as Pre-Procedure . Wound #2 Pre-procedure diagnosis  of Wound #2 is a Diabetic Wound/Ulcer of the Lower Extremity located on the Left,Distal Calcaneus .Severity of Tissue Pre Debridement is: Fat layer exposed. There was a Excisional Skin/Subcutaneous Tissue Debridement with a total area of 8 sq cm performed by Geralyn Corwin, DO. With the following instrument(s): Curette to remove Viable and Non-Viable tissue/material. Material removed includes Subcutaneous Tissue and Slough and after achieving pain control using Lidocaine 5% topical  ointment. A time out was conducted at 11:40, prior to the start of the procedure. A Minimum amount of bleeding was controlled with Pressure. The procedure was tolerated well with a pain level of 0 throughout and a pain level of 0 following the procedure. Post Debridement Measurements: 2cm length x 4cm width x 0.1cm depth; 0.628cm^3 volume. Character of Wound/Ulcer Post Debridement is improved. Severity of Tissue Post Debridement is: Fat layer exposed. Post procedure Diagnosis Wound #2: Same as Pre-Procedure Pre-procedure diagnosis of Wound #2 is a Diabetic Wound/Ulcer of the Lower Extremity located on the Left,Distal Calcaneus. A skin graft procedure using a bioengineered skin substitute/cellular or tissue based product was performed by Geralyn Corwin, DO with the following instrument(s): Forceps and Scissors. Epicord was applied and secured with Steri-Strips and adaptic. 1 sq cm of product was utilized and 2 sq cm was wasted due to wound size. Post Application, no dressing was applied. A Time Out was conducted at 11:56, prior to the start of the procedure. The procedure was tolerated well with a pain level of 0 throughout and a pain level of 0 following the procedure. Post procedure Diagnosis Wound #2: Same as Pre-Procedure . Plan Follow-up Appointments: Return Appointment in 1 week. - Dr. Mikey Bussing and [name], Room 8 Other: - *****FACILITY TO NOT CHANGE DRESSINGS. LEAVE IN PLACE TO LEFT LEG/HEEL WOUNDS.*** Anesthetic: (In clinic) Topical Lidocaine 5% applied to wound bed Cellular or Tissue Based Products: Cellular or Tissue Based Product Type: - Run IVR for EpiFix and Epicord=100% covered 05/07/22: Epicord ordered Cellular or Tissue Based Product applied to wound bed, secured with steri-strips, cover with Adaptic or Mepitel. (DO NOT REMOVE). - Epicord #1 05/15/25 Epicord #2 05/22/22 Epicord #3 06/29/2022 Epicord #4 07/07/2022 Bathing/ Shower/ Hygiene: May shower with protection but do not get wound  dressing(s) wet. - May use cast protector bag from [facility], [facility] or [employer] Edema Control - Lymphedema / SCD / Other: Elevate legs to the level of the heart or above for 30 minutes daily and/or when sitting, a frequency of: Avoid standing for long periods of time. Off-Loading: Prevalon Boot - use while in bed and chair. Additional Orders / Instructions: Follow Nutritious Diet - Monitor/Control Blood Sugar WOUND #1: - Calcaneus Wound Laterality: Left Cleanser: Soap and Water 1 x Per Week/30 Days Discharge Instructions: May shower and wash wound with dial antibacterial soap and water prior to dressing change. Cleanser: Wound Cleanser 1 x Per Week/30 Days Discharge Instructions: Cleanse the wound with wound cleanser prior to applying a clean dressing using gauze sponges, not tissue or cotton balls. Peri-Wound Care: Triamcinolone 15 (g) 1 x Per Week/30 Days Discharge Instructions: Use triamcinolone 15 (g) as directed Peri-Wound Care: Sween Lotion (Moisturizing lotion) 1 x Per Week/30 Days Discharge Instructions: Apply moisturizing lotion as directed Prim Dressing: Epicord 1 x Per Week/30 Days ary Discharge Instructions: applied by provider secured with adaptic and steri-strips. Secondary Dressing: ALLEVYN Heel 4 1/2in x 5 1/2in / 10.5cm x 13.5cm 1 x Per Week/30 Days Discharge Instructions: Apply over primary dressing as directed. Secondary Dressing: Woven Gauze Sponge,  Non-Sterile 4x4 in (Home Health) 1 x Per Week/30 Days Discharge Instructions: Apply over primary dressing as directed. Com pression Wrap: ThreePress (3 layer compression wrap) (Home Health) 1 x Per Week/30 Days Discharge Instructions: Apply three layer compression as directed. WOUND #2: - Calcaneus Wound Laterality: Left, Distal Cleanser: Soap and Water 1 x Per Week/30 Days Discharge Instructions: May shower and wash wound with dial antibacterial soap and water prior to dressing change. Cleanser: Wound Cleanser 1 x Per Week/30  Days Discharge Instructions: Cleanse the wound with wound cleanser prior to applying a clean dressing using gauze sponges, not tissue or cotton balls. Peri-Wound Care: Triamcinolone 15 (g) 1 x Per Week/30 Days Discharge Instructions: Use triamcinolone 15 (g) as directed Peri-Wound Care: Sween Lotion (Moisturizing lotion) 1 x Per Week/30 Days Discharge Instructions: Apply moisturizing lotion as directed Prim Dressing: Epicord placed 1/2 the wound 1 x Per Week/30 Days ary Discharge Instructions: placed by provider. Prim Dressing: Santyl and Hydrofera Blue the other1/2 of wound 1 x Per Week/30 Days ary Discharge Instructions: placed by nurse. Secondary Dressing: ALLEVYN Heel 4 1/2in x 5 1/2in / 10.5cm x 13.5cm 1 x Per Week/30 Days Discharge Instructions: Apply over primary dressing as directed. Secondary Dressing: Woven Gauze Sponge, Non-Sterile 4x4 in (Home Health) 1 x Per Week/30 Days Discharge Instructions: Apply over primary dressing as directed. Com pression Wrap: ThreePress (3 layer compression wrap) (Home Health) 1 x Per Week/30 Days Discharge Instructions: Apply three layer compression as directed. 1. In office sharp debridement 2. Epicort #type IV placed in standard fashion today 3. Santyl and Hydrofera Blue 4. 3 layer compression 5. Follow-up in 1 week Electronic Signature(s) Signed: 07/07/2022 4:41:45 PM By: Geralyn Corwin DO Entered By: Geralyn Corwin on 07/07/2022 12:57:08 -------------------------------------------------------------------------------- HxROS Details Patient Name: Date of Service: [name], [name]. 07/07/2022 11:00 A M Medical Record Number: [id number] Patient Account Number: [id number] Date of Birth/Sex: Treating RN: [birth year] (61 y.o. F) Primary Care Provider: [name] Other Clinician: Referring Provider: Treating Provider/Extender: [name] in Treatment: 17 Information Obtained From Patient  Chart Hematologic/Lymphatic Medical History: Positive for: Anemia Respiratory Medical History: Positive for: Chronic Obstructive Pulmonary Disease (COPD) Cardiovascular Medical History: Positive for: Congestive Heart Failure - Weighs self daily; Hypertension; Myocardial Infarction; Peripheral Venous Disease Gastrointestinal Medical History: Past Medical History Notes: GERD, Peptic Ulcer Disease Endocrine Medical History: Positive for: Type II Diabetes Time with diabetes: 16 years Treated with: Insulin, Oral agents Blood sugar tested every day: Yes Tested : Twice daily Musculoskeletal Medical History: Past Medical History Notes: Broke left leg 3 years ago, 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, foot fused Neurologic Medical History: Positive for: Neuropathy Psychiatric Medical History: Past Medical History Notes: Depression Immunizations Pneumococcal Vaccine: Received Pneumococcal Vaccination: No Implantable Devices None Family and Social History Cancer: Yes - Father,Mother; Diabetes: Yes - Mother,Maternal Grandparents,Paternal Grandparents; Heart Disease: Yes - Siblings,Maternal Grandparents; Hypertension: Yes - Siblings; Tuberculosis: Yes - Maternal Grandparents,Paternal Grandparents; Current every day smoker; Marital Status - Single; Alcohol Use: Never; Drug Use: No History; Caffeine Use: Daily; Financial Concerns: No; Food, Clothing or Shelter Needs: No; Support System Lacking: No; Transportation Concerns: No Electronic Signature(s) Signed: 07/07/2022 4:41:45 PM By: Geralyn Corwin DO Entered By: Geralyn Corwin on 07/07/2022 12:48:22 -------------------------------------------------------------------------------- SuperBill Details Patient Name: Date of Service: [name] 07/07/2022 Medical Record Number: [id number] Patient Account Number: [id number] Date of Birth/Sex: Treating RN: March 05, 1961 (61 y.o. [name], [name] Primary Care Provider: [name] Other  Clinician: Referring Provider: Treating Provider/Extender: Geralyn Corwin  [name] Weeks in Treatment: 17 Diagnosis Coding ICD-10 Codes Code Description [phone] Non-pressure chronic ulcer of other part of left foot with fat layer exposed E11.621 Type 2 diabetes mellitus with foot ulcer E66.01 Morbid (severe) obesity due to excess calories Facility Procedures CPT4 Code: [id number] Description: [id number] Epicord 2cm x 3cm - per sqcm Modifier: Quantity: 6 CPT4 Code: [id number] Description: 15275 - SKIN SUB GRAFT FACE/NK/HF/G ICD-10 Diagnosis Description L97.522 Non-pressure chronic ulcer of other part of left foot with fat layer exposed Modifier: Quantity: 1 Physician Procedures : CPT4 Code Description Modifier [id number] 15275 - WC PHYS SKIN SUB GRAFT FACE/NK/HF/G ICD-10 Diagnosis Description L97.522 Non-pressure chronic ulcer of other part of left foot with fat layer exposed Quantity: 1 Electronic Signature(s) Signed: 07/07/2022 4:41:45 PM By: Geralyn Corwin DO Entered By: Geralyn Corwin on 07/07/2022 12:57:21

## 2022-07-09 NOTE — Progress Notes (Signed)
Chou, [place] C. ([id number]) Visit Report for 07/07/2022 Arrival Information Details Patient Name: Date of Service: [name] 07/07/2022 11:00 A M Medical Record Number: [id number] Patient Account Number: [id number] Date of Birth/Sex: Treating RN: 02-24-1961 (61 y.o. F) Primary Care Sollie Vultaggio: Jenean Lindau Other Clinician: Referring Channell Quattrone: Treating Yitzchok Carriger/Extender: Elise Benne in Treatment: 4 Visit Information History Since Last Visit Added or deleted any medications: No Patient Arrived: Ambulatory Any new allergies or adverse reactions: No Arrival Time: 10:57 Had a fall or experienced change in No Accompanied By: self activities of daily living that may affect Transfer Assistance: None risk of falls: Patient Identification Verified: Yes Signs or symptoms of abuse/neglect since last visito No Secondary Verification Process Completed: Yes Hospitalized since last visit: No Patient Requires Transmission-Based Precautions: No Implantable device outside of the clinic excluding No Patient Has Alerts: No cellular tissue based products placed in the center since last visit: Has Compression in Place as Prescribed: Yes Pain Present Now: Yes Electronic Signature(s) Signed: 07/07/2022 4:28:27 PM By: Erenest Blank Entered By: Erenest Blank on 07/07/2022 10:58:24 -------------------------------------------------------------------------------- Encounter Discharge Information Details Patient Name: Date of Service: [name] Lax C. 07/07/2022 11:00 A M Medical Record Number: [id number] Patient Account Number: [id number] Date of Birth/Sex: Treating RN: 08-Feb-1961 (61 y.o. [name], [name] Primary Care Cleavon Goldman: Jenean Lindau Other Clinician: Referring Rufino Staup: Treating Christoper Bushey/Extender: Elise Benne in Treatment: 17 Encounter Discharge Information Items Post Procedure Vitals Discharge Condition:  Stable Temperature (F): 98.3 Ambulatory Status: Wheelchair Pulse (bpm): 99 Discharge Destination: Home Respiratory Rate (breaths/min): 20 Transportation: Private Auto Blood Pressure (mmHg): 165/82 Accompanied By: caregiver Schedule Follow-up Appointment: Yes Clinical Summary of Care: Electronic Signature(s) Signed: 07/09/2022 5:16:03 PM By: Deon Pilling RN, BSN Entered By: Deon Pilling on 07/07/2022 12:04:51 -------------------------------------------------------------------------------- Lower Extremity Assessment Details Patient Name: Date of Service: [name] Durward Parcel MY C. 07/07/2022 11:00 A M Medical Record Number: [id number] Patient Account Number: [id number] Date of Birth/Sex: Treating RN: 1961-05-15 (61 y.o. F) Primary Care Gaius Ishaq: Jenean Lindau Other Clinician: Referring Omni Dunsworth: Treating Conrado Nance/Extender: Elise Benne in Treatment: 17 Edema Assessment Assessed: Shirlyn Goltz: No] Patrice Paradise: No] Edema: [Left: Ye] [Right: s] Calf Left: Right: Point of Measurement: 28 cm From Medial Instep 50 cm Ankle Left: Right: Point of Measurement: 3 cm From Medial Instep 30.5 cm Vascular Assessment Pulses: Dorsalis Pedis Palpable: [Left:Yes] Electronic Signature(s) Signed: 07/07/2022 4:28:27 PM By: Erenest Blank Entered By: Erenest Blank on 07/07/2022 11:11:09 -------------------------------------------------------------------------------- Multi Wound Chart Details Patient Name: Date of Service: [name] Lax C. 07/07/2022 11:00 A M Medical Record Number: [id number] Patient Account Number: [id number] Date of Birth/Sex: Treating RN: 05/09/1961 (61 y.o. F) Primary Care Shamekia Tippets: Jenean Lindau Other Clinician: Referring Kelsay Haggard: Treating Chihiro Frey/Extender: Elise Benne in Treatment: 17 Vital Signs Height(in): 46 Pulse(bpm): 99 Weight(lbs): 339 Blood Pressure(mmHg): 165/82 Body Mass Index(BMI):  54.7 Temperature(F): 98.3 Respiratory Rate(breaths/min): 20 Photos: [1:Left Calcaneus] [2:Left, Distal Calcaneus] [N/A:N/A N/A] Wound Location: [1:Other Lesion] [2:Blister] [N/A:N/A] Wounding Event: [1:Diabetic Wound/Ulcer of the Lower] [2:Diabetic Wound/Ulcer of the Lower] [N/A:N/A] Primary Etiology: [1:Extremity Anemia, Chronic Obstructive] [2:Extremity Anemia, Chronic Obstructive] [N/A:N/A] Comorbid History: [1:Pulmonary Disease (COPD), Congestive Heart Failure, Hypertension, Myocardial Infarction, Hypertension, Myocardial Infarction, Peripheral Venous Disease, Type II Peripheral Venous Disease, Type II Diabetes, Neuropathy 10/06/2021]  [2:Pulmonary Disease (COPD), Congestive Heart Failure, Diabetes, Neuropathy 06/15/2022] [N/A:N/A] Date Acquired: [1:17] [2:1] [N/A:N/A] Weeks of Treatment: [1:Open] [2:Open] [N/A:N/A] Wound Status: [1:No] [2:No] [N/A:N/A] Wound Recurrence: [1:1.5x1.4x0.1] [2:2x4x0.1] [N/A:N/A] Measurements L x  W x D (cm) [1:1.649] [2:6.283] [N/A:N/A] A (cm) : rea [1:0.165] [2:0.628] [N/A:N/A] Volume (cm) : [1:62.50%] [2:55.60%] [N/A:N/A] % Reduction in A [1:rea: 81.30%] [2:55.60%] [N/A:N/A] % Reduction in Volume: [1:Grade 2] [2:Grade 2] [N/A:N/A] Classification: [1:Medium] [2:Medium] [N/A:N/A] Exudate A mount: [1:Serosanguineous] [2:Serosanguineous] [N/A:N/A] Exudate Type: [1:red, brown] [2:red, brown] [N/A:N/A] Exudate Color: [1:Distinct, outline attached] [2:Distinct, outline attached] [N/A:N/A] Wound Margin: [1:Large (67-100%)] [2:Large (67-100%)] [N/A:N/A] Granulation A mount: [1:Red, Pink] [2:Red, Pink] [N/A:N/A] Granulation Quality: [1:None Present (0%)] [2:Small (1-33%)] [N/A:N/A] Necrotic A mount: [1:N/A] [2:Eschar, Adherent Slough] [N/A:N/A] Necrotic Tissue: [1:Fat Layer (Subcutaneous Tissue): Yes Fat Layer (Subcutaneous Tissue): Yes N/A] Exposed Structures: [1:Fascia: No Tendon: No Muscle: No Joint: No Bone: No Medium (34-66%)] [2:Fascia: No Tendon: No  Muscle: No Joint: No Bone: No Small (1-33%)] [N/A:N/A] Epithelialization: [1:Debridement - Excisional] [2:Debridement - Excisional] [N/A:N/A] Debridement: Pre-procedure Verification/Time Out 11:40 [2:11:40] [N/A:N/A] Taken: [1:Lidocaine 5% topical ointment] [2:Lidocaine 5% topical ointment] [N/A:N/A] Pain Control: [1:Subcutaneous, Slough] [2:Subcutaneous, Slough] [N/A:N/A] Tissue Debrided: [1:Skin/Subcutaneous Tissue] [2:Skin/Subcutaneous Tissue] [N/A:N/A] Level: [1:2.25] [2:8] [N/A:N/A] Debridement A (sq cm): [1:rea Curette] [2:Curette] [N/A:N/A] Instrument: [1:Minimum] [2:Minimum] [N/A:N/A] Bleeding: [1:Pressure] [2:Pressure] [N/A:N/A] Hemostasis A chieved: [1:0] [2:0] [N/A:N/A] Procedural Pain: [1:0] [2:0] [N/A:N/A] Post Procedural Pain: [1:Procedure was tolerated well] [2:Procedure was tolerated well] [N/A:N/A] Debridement Treatment Response: [1:1.5x2.5x0.1] [2:2x4x0.1] [N/A:N/A] Post Debridement Measurements L x W x D (cm) [1:0.295] [2:0.628] [N/A:N/A] Post Debridement Volume: (cm) [1:Callus: Yes] [2:No Abnormalities Noted] [N/A:N/A] Periwound Skin Texture: [1:Excoriation: No Induration: No Crepitus: No Rash: No Scarring: No No Abnormalities Noted] [2:No Abnormalities Noted] [N/A:N/A] Periwound Skin Moisture: [1:No Abnormalities Noted] [2:No Abnormalities Noted] [N/A:N/A] Periwound Skin Color: [1:No Abnormality] [2:No Abnormality] [N/A:N/A] Temperature: [1:Cellular or Tissue Based Product] [2:Cellular or Tissue Based Product] [N/A:N/A] Procedures Performed: [1:Debridement] [2:Debridement] Treatment Notes Wound #1 (Calcaneus) Wound Laterality: Left Cleanser Soap and Water Discharge Instruction: May shower and wash wound with dial antibacterial soap and water prior to dressing change. Wound Cleanser Discharge Instruction: Cleanse the wound with wound cleanser prior to applying a clean dressing using gauze sponges, not tissue or cotton balls. Peri-Wound Care Triamcinolone 15  (g) Discharge Instruction: Use triamcinolone 15 (g) as directed Sween Lotion (Moisturizing lotion) Discharge Instruction: Apply moisturizing lotion as directed Topical Primary Dressing Epicord Discharge Instruction: applied by Marifer Hurd secured with adaptic and steri-strips. Secondary Dressing ALLEVYN Heel 4 1/2in x 5 1/2in / 10.5cm x 13.5cm Discharge Instruction: Apply over primary dressing as directed. Woven Gauze Sponge, Non-Sterile 4x4 in Discharge Instruction: Apply over primary dressing as directed. Secured With Compression Wrap ThreePress (3 layer compression wrap) Discharge Instruction: Apply three layer compression as directed. Compression Stockings Add-Ons Wound #2 (Calcaneus) Wound Laterality: Left, Distal Cleanser Soap and Water Discharge Instruction: May shower and wash wound with dial antibacterial soap and water prior to dressing change. Wound Cleanser Discharge Instruction: Cleanse the wound with wound cleanser prior to applying a clean dressing using gauze sponges, not tissue or cotton balls. Peri-Wound Care Triamcinolone 15 (g) Discharge Instruction: Use triamcinolone 15 (g) as directed Sween Lotion (Moisturizing lotion) Discharge Instruction: Apply moisturizing lotion as directed Topical Primary Dressing Epicord placed 1/2 the wound Discharge Instruction: placed by Stanford Strauch. Santyl and Hydrofera Blue the other1/2 of wound Discharge Instruction: placed by nurse. Secondary Dressing ALLEVYN Heel 4 1/2in x 5 1/2in / 10.5cm x 13.5cm Discharge Instruction: Apply over primary dressing as directed. Woven Gauze Sponge, Non-Sterile 4x4 in Discharge Instruction: Apply over primary dressing as directed. Secured With Compression Wrap ThreePress (3 layer compression wrap) Discharge Instruction: Apply three layer compression as directed.  Compression Stockings Add-Ons Electronic Signature(s) Signed: 07/07/2022 4:41:45 PM By: Kalman Shan DO Entered By: Kalman Shan on 07/07/2022 12:46:45 -------------------------------------------------------------------------------- Multi-Disciplinary Care Plan Details Patient Name: Date of Service: [name] Lax C. 07/07/2022 11:00 A M Medical Record Number: [id number] Patient Account Number: [id number] Date of Birth/Sex: Treating RN: Oct 04, 1961 (61 y.o. [name], [name] Primary Care Darnella Zeiter: Jenean Lindau Other Clinician: Referring Denzel Etienne: Treating Yonah Tangeman/Extender: Elise Benne in Treatment: 75 Active Inactive Abuse / Safety / Falls / Self Care Management Nursing Diagnoses: History of Falls Goals: Patient will not experience any injury related to falls Date Initiated: 03/05/2022 Target Resolution Date: 07/23/2022 Goal Status: Active Interventions: Assess fall risk on admission and as needed Assess: immobility, friction, shearing, incontinence upon admission and as needed Assess impairment of mobility on admission and as needed per policy Notes: 7/51/02: Fall prevention ongoing, recent fall. Wound/Skin Impairment Nursing Diagnoses: Impaired tissue integrity Goals: Patient/caregiver will verbalize understanding of skin care regimen Date Initiated: 03/05/2022 Target Resolution Date: 08/28/2022 Goal Status: Active Ulcer/skin breakdown will have a volume reduction of 30% by week 4 Date Initiated: 03/05/2022 Date Inactivated: 04/30/2022 Target Resolution Date: 05/02/2022 Goal Status: Met Ulcer/skin breakdown will have a volume reduction of 50% by week 8 Date Initiated: 04/30/2022 Date Inactivated: 07/07/2022 Target Resolution Date: 07/04/2022 Unmet Reason: patient sent three Goal Status: Unmet weeks inpatient and rehab before returning to wound center. Interventions: Assess patient/caregiver ability to obtain necessary supplies Assess patient/caregiver ability to perform ulcer/skin care regimen upon admission and as needed Assess ulceration(s) every  visit Provide education on smoking Provide education on ulcer and skin care Treatment Activities: Topical wound management initiated : 03/05/2022 Notes: 04/30/22: Wound care regimen continues. Electronic Signature(s) Signed: 07/09/2022 5:16:03 PM By: Deon Pilling RN, BSN Entered By: Deon Pilling on 07/07/2022 11:24:16 -------------------------------------------------------------------------------- Pain Assessment Details Patient Name: Date of Service: [name] Lax C. 07/07/2022 11:00 A M Medical Record Number: [id number] Patient Account Number: [id number] Date of Birth/Sex: Treating RN: March 21, 1961 (61 y.o. F) Primary Care Naela Nodal: Jenean Lindau Other Clinician: Referring Imraan Wendell: Treating Rimsha Trembley/Extender: Elise Benne in Treatment: 17 Active Problems Location of Pain Severity and Description of Pain Patient Has Paino Yes Site Locations Pain Location: Generalized Pain, Pain in Ulcers Rate the pain. Current Pain Level: 8 Pain Management and Medication Current Pain Management: Medication: Yes Electronic Signature(s) Signed: 07/07/2022 4:28:27 PM By: Erenest Blank Entered By: Erenest Blank on 07/07/2022 10:58:43 -------------------------------------------------------------------------------- Patient/Caregiver Education Details Patient Name: Date of Service: [name] 10/3/2023andnbsp11:00 A M Medical Record Number: [id number] Patient Account Number: [id number] Date of Birth/Gender: Treating RN: [birth year] (61 y.o. [name], [name] Primary Care Physician: Jenean Lindau Other Clinician: Referring Physician: Treating Physician/Extender: Elise Benne in Treatment: 17 Education Assessment Education Provided To: Patient Education Topics Provided Wound/Skin Impairment: Handouts: Skin Care Do's and Dont's Methods: Explain/Verbal Responses: Reinforcements needed Electronic Signature(s) Signed:  07/09/2022 5:16:03 PM By: Deon Pilling RN, BSN Entered By: Deon Pilling on 07/07/2022 11:24:36 -------------------------------------------------------------------------------- Wound Assessment Details Patient Name: Date of Service: [name] Lax C. 07/07/2022 11:00 A M Medical Record Number: [id number] Patient Account Number: [id number] Date of Birth/Sex: Treating RN: [birth year] (61 y.o. F) Primary Care Nagee Goates: Jenean Lindau Other Clinician: Referring Sidonie Dexheimer: Treating Chandon Lazcano/Extender: Elise Benne in Treatment: 17 Wound Status Wound Number: 1 Primary Diabetic Wound/Ulcer of the Lower Extremity Etiology: Wound Location: Left Calcaneus Wound Open Wounding Event: Other Lesion Status: Date Acquired: 10/06/2021 Comorbid Anemia, Chronic Obstructive  Pulmonary Disease (COPD), Weeks Of Treatment: 17 History: Congestive Heart Failure, Hypertension, Myocardial Infarction, Clustered Wound: No Peripheral Venous Disease, Type II Diabetes, Neuropathy Photos Wound Measurements Length: (cm) 1.5 Width: (cm) 1.4 Depth: (cm) 0.1 Area: (cm) 1.649 Volume: (cm) 0.165 % Reduction in Area: 62.5% % Reduction in Volume: 81.3% Epithelialization: Medium (34-66%) Tunneling: No Undermining: No Wound Description Classification: Grade 2 Wound Margin: Distinct, outline attached Exudate Amount: Medium Exudate Type: Serosanguineous Exudate Color: red, brown Foul Odor After Cleansing: No Slough/Fibrino No Wound Bed Granulation Amount: Large (67-100%) Exposed Structure Granulation Quality: Red, Pink Fascia Exposed: No Necrotic Amount: None Present (0%) Fat Layer (Subcutaneous Tissue) Exposed: Yes Tendon Exposed: No Muscle Exposed: No Joint Exposed: No Bone Exposed: No Periwound Skin Texture Texture Color No Abnormalities Noted: No No Abnormalities Noted: Yes Callus: Yes Temperature / Pain Crepitus: No Temperature: No Abnormality Excoriation:  No Induration: No Rash: No Scarring: No Moisture No Abnormalities Noted: Yes Treatment Notes Wound #1 (Calcaneus) Wound Laterality: Left Cleanser Soap and Water Discharge Instruction: May shower and wash wound with dial antibacterial soap and water prior to dressing change. Wound Cleanser Discharge Instruction: Cleanse the wound with wound cleanser prior to applying a clean dressing using gauze sponges, not tissue or cotton balls. Peri-Wound Care Triamcinolone 15 (g) Discharge Instruction: Use triamcinolone 15 (g) as directed Sween Lotion (Moisturizing lotion) Discharge Instruction: Apply moisturizing lotion as directed Topical Primary Dressing Epicord Discharge Instruction: applied by Tkeyah Burkman secured with adaptic and steri-strips. Secondary Dressing ALLEVYN Heel 4 1/2in x 5 1/2in / 10.5cm x 13.5cm Discharge Instruction: Apply over primary dressing as directed. Woven Gauze Sponge, Non-Sterile 4x4 in Discharge Instruction: Apply over primary dressing as directed. Secured With Compression Wrap ThreePress (3 layer compression wrap) Discharge Instruction: Apply three layer compression as directed. Compression Stockings Add-Ons Electronic Signature(s) Signed: 07/07/2022 4:28:27 PM By: Erenest Blank Entered By: Erenest Blank on 07/07/2022 11:15:53 -------------------------------------------------------------------------------- Wound Assessment Details Patient Name: Date of Service: [name] Lax C. 07/07/2022 11:00 A M Medical Record Number: [id number] Patient Account Number: [id number] Date of Birth/Sex: Treating RN: 1961-09-06 (61 y.o. F) Primary Care Honor Fairbank: Jenean Lindau Other Clinician: Referring Ary Lavine: Treating Ahmar Pickrell/Extender: Elise Benne in Treatment: 17 Wound Status Wound Number: 2 Primary Diabetic Wound/Ulcer of the Lower Extremity Etiology: Wound Location: Left, Distal Calcaneus Wound Open Wounding Event:  Blister Status: Date Acquired: 06/15/2022 Comorbid Anemia, Chronic Obstructive Pulmonary Disease (COPD), Weeks Of Treatment: 1 History: Congestive Heart Failure, Hypertension, Myocardial Infarction, Clustered Wound: No Peripheral Venous Disease, Type II Diabetes, Neuropathy Photos Wound Measurements Length: (cm) 2 Width: (cm) 4 Depth: (cm) 0.1 Area: (cm) 6.283 Volume: (cm) 0.628 % Reduction in Area: 55.6% % Reduction in Volume: 55.6% Epithelialization: Small (1-33%) Tunneling: No Undermining: No Wound Description Classification: Grade 2 Wound Margin: Distinct, outline attached Exudate Amount: Medium Exudate Type: Serosanguineous Exudate Color: red, brown Foul Odor After Cleansing: No Slough/Fibrino Yes Wound Bed Granulation Amount: Large (67-100%) Exposed Structure Granulation Quality: Red, Pink Fascia Exposed: No Necrotic Amount: Small (1-33%) Fat Layer (Subcutaneous Tissue) Exposed: Yes Necrotic Quality: Eschar, Adherent Slough Tendon Exposed: No Muscle Exposed: No Joint Exposed: No Bone Exposed: No Periwound Skin Texture Texture Color No Abnormalities Noted: Yes No Abnormalities Noted: Yes Moisture Temperature / Pain No Abnormalities Noted: Yes Temperature: No Abnormality Treatment Notes Wound #2 (Calcaneus) Wound Laterality: Left, Distal Cleanser Soap and Water Discharge Instruction: May shower and wash wound with dial antibacterial soap and water prior to dressing change. Wound Cleanser Discharge Instruction: Cleanse the wound with wound  cleanser prior to applying a clean dressing using gauze sponges, not tissue or cotton balls. Peri-Wound Care Triamcinolone 15 (g) Discharge Instruction: Use triamcinolone 15 (g) as directed Sween Lotion (Moisturizing lotion) Discharge Instruction: Apply moisturizing lotion as directed Topical Primary Dressing Epicord placed 1/2 the wound Discharge Instruction: placed by Domanique Huesman. Santyl and Hydrofera Blue the  other1/2 of wound Discharge Instruction: placed by nurse. Secondary Dressing ALLEVYN Heel 4 1/2in x 5 1/2in / 10.5cm x 13.5cm Discharge Instruction: Apply over primary dressing as directed. Woven Gauze Sponge, Non-Sterile 4x4 in Discharge Instruction: Apply over primary dressing as directed. Secured With Compression Wrap ThreePress (3 layer compression wrap) Discharge Instruction: Apply three layer compression as directed. Compression Stockings Add-Ons Electronic Signature(s) Signed: 07/07/2022 4:28:27 PM By: Erenest Blank Entered By: Erenest Blank on 07/07/2022 11:17:19 -------------------------------------------------------------------------------- Vitals Details Patient Name: Date of Service: [name] Lax C. 07/07/2022 11:00 A M Medical Record Number: [id number] Patient Account Number: [id number] Date of Birth/Sex: Treating RN: [birth year] (61 y.o. F) Primary Care Earla Charlie: Jenean Lindau Other Clinician: Referring Kruti Horacek: Treating Kerina Simoneau/Extender: Elise Benne in Treatment: 17 Vital Signs Time Taken: 11:01 Temperature (F): 98.3 Height (in): 66 Pulse (bpm): 99 Weight (lbs): 339 Respiratory Rate (breaths/min): 20 Body Mass Index (BMI): 54.7 Blood Pressure (mmHg): 165/82 Reference Range: 80 - 120 mg / dl Electronic Signature(s) Signed: 07/07/2022 4:28:27 PM By: Erenest Blank Entered By: Erenest Blank on 07/07/2022 11:01:34

## 2022-07-14 ENCOUNTER — Encounter (HOSPITAL_BASED_OUTPATIENT_CLINIC_OR_DEPARTMENT_OTHER): Payer: 59 | Admitting: Internal Medicine

## 2022-07-14 DIAGNOSIS — L97522 Non-pressure chronic ulcer of other part of left foot with fat layer exposed: Secondary | ICD-10-CM | POA: Diagnosis not present

## 2022-07-14 DIAGNOSIS — E11621 Type 2 diabetes mellitus with foot ulcer: Secondary | ICD-10-CM | POA: Diagnosis not present

## 2022-07-14 NOTE — Progress Notes (Signed)
[name], [name] ([id number]) [id number].pdf Page 1 of 10 Visit Report for 07/14/2022 Arrival Information Details Patient Name: Date of Service: [name] 07/14/2022 3:30 PM Medical Record Number: [id number] Patient Account Number: [id number] Date of Birth/Sex: Treating RN: Jan 20, 1961 (61 y.o. [name] Primary Care Sadae Arrazola: Jenean Lindau Other Clinician: Referring Krithi Bray: Treating Caroljean Monsivais/Extender: Elise Benne in Treatment: 18 Visit Information History Since Last Visit Added or deleted any medications: No Patient Arrived: Wheel Chair Any new allergies or adverse reactions: No Arrival Time: 15:45 Had a fall or experienced change in No Transfer Assistance: None activities of daily living that may affect Patient Identification Verified: Yes risk of falls: Secondary Verification Process Completed: Yes Signs or symptoms of abuse/neglect since last visito No Patient Requires Transmission-Based Precautions: No Hospitalized since last visit: No Patient Has Alerts: No Implantable device outside of the clinic excluding No cellular tissue based products placed in the center since last visit: Has Dressing in Place as Prescribed: Yes Has Compression in Place as Prescribed: Yes Pain Present Now: Yes Electronic Signature(s) Signed: 07/14/2022 5:19:10 PM By: Lorrin Jackson Entered By: Lorrin Jackson on 07/14/2022 15:50:59 -------------------------------------------------------------------------------- Compression Therapy Details Patient Name: Date of Service: [name] Lax C. 07/14/2022 3:30 PM Medical Record Number: [id number] Patient Account Number: [id number] Date of Birth/Sex: Treating RN: [birth year] (61 y.o. [name] Primary Care Lilley Hubble: Jenean Lindau Other Clinician: Referring Shikha Bibb: Treating Tomio Kirk/Extender: Elise Benne in Treatment: 18 Compression Therapy  Performed for Wound Assessment: Wound #1 Left Calcaneus Performed By: Clinician Lorrin Jackson, RN Compression Type: Three Layer Post Procedure Diagnosis Same as Pre-procedure Electronic Signature(s) Signed: 07/14/2022 5:19:10 PM By: Fara Chute By: Lorrin Jackson on 07/14/2022 16:18:57 [name], [name] ([id number]) [id number] Page 2 of 10 -------------------------------------------------------------------------------- Compression Therapy Details Patient Name: Date of Service: [name] 07/14/2022 3:30 PM Medical Record Number: [id number] Patient Account Number: [id number] Date of Birth/Sex: Treating RN: 19-Jan-1961 (61 y.o. [name] Primary Care Aleksey Newbern: Jenean Lindau Other Clinician: Referring Latrell Reitan: Treating [name]/Extender: Elise Benne in Treatment: 18 Compression Therapy Performed for Wound Assessment: Wound #2 Left,Distal Calcaneus Performed By: Clinician Lorrin Jackson, RN Compression Type: Three Layer Post Procedure Diagnosis Same as Pre-procedure Electronic Signature(s) Signed: 07/14/2022 5:19:10 PM By: Lorrin Jackson Entered By: Lorrin Jackson on 07/14/2022 16:18:57 -------------------------------------------------------------------------------- Encounter Discharge Information Details Patient Name: Date of Service: [name] Lax C. 07/14/2022 3:30 PM Medical Record Number: [id number] Patient Account Number: [id number] Date of Birth/Sex: Treating RN: 1960/10/22 (61 y.o. [name] Primary Care [name]: Jenean Lindau Other Clinician: Referring Ambrosio Reuter: Treating [name]/Extender: Elise Benne in Treatment: 18 Encounter Discharge Information Items Post Procedure Vitals Discharge Condition: Stable Temperature (F): 98.2 Ambulatory Status: Wheelchair Pulse (bpm): 101 Discharge Destination: [place] Respiratory Rate  (breaths/min): 18 Orders Sent: Yes Blood Pressure (mmHg): 149/88 Transportation: Other Schedule Follow-up Appointment: Yes Clinical Summary of Care: Provided on 07/14/2022 Form Type Recipient Paper Patient Patient Electronic Signature(s) Signed: 07/14/2022 5:19:10 PM By: Lorrin Jackson Entered By: Lorrin Jackson on 07/14/2022 16:43:25 -------------------------------------------------------------------------------- Lower Extremity Assessment Details Patient Name: Date of Service: [name] Lax C. 07/14/2022 3:30 PM Medical Record Number: [id number] Patient Account Number: [id number] Date of Birth/Sex: Treating RN: 04/11/1961 (61 y.o. [name] Primary Care [name]: Jenean Lindau Other Clinician: Referring Neomia Herbel: Treating Alveta Quintela/Extender: Elise Benne in Treatment: 18 Edema Assessment Assessed: Shirlyn Goltz: Yes] [name]: No] Edema: [Left: Ye] [Right: s] H[Left: UFFMAN,  Delaney C ([id number])] [Right: [id number] Page 3 of 10] Calf Left: Right: Point of Measurement: 28 cm From Medial Instep 50 cm Ankle Left: Right: Point of Measurement: 3 cm From Medial Instep 32 cm Vascular Assessment Pulses: Dorsalis Pedis Palpable: [Left:Yes] Electronic Signature(s) Signed: 07/14/2022 5:19:10 PM By: Lorrin Jackson Entered By: Lorrin Jackson on 07/14/2022 15:57:09 -------------------------------------------------------------------------------- Multi Wound Chart Details Patient Name: Date of Service: [name] Lax C. 07/14/2022 3:30 PM Medical Record Number: [id number] Patient Account Number: [id number] Date of Birth/Sex: Treating RN: 12-Jul-1961 (61 y.o. F) Primary Care Trianna Lupien: Jenean Lindau Other Clinician: Referring Laurann Mcmorris: Treating [name]/Extender: Elise Benne in Treatment: 18 Vital Signs Height(in): 66 Capillary Blood Glucose(mg/dl): 400 Weight(lbs): 339 Pulse(bpm): 101 Body  Mass Index(BMI): 54.7 Blood Pressure(mmHg): 149/88 Temperature(F): 98.2 Respiratory Rate(breaths/min): 18 [1:Photos:] [N/A:N/A] Left Calcaneus Left, Distal Calcaneus N/A Wound Location: Other Lesion Blister N/A Wounding Event: Diabetic Wound/Ulcer of the Lower Diabetic Wound/Ulcer of the Lower N/A Primary Etiology: Extremity Extremity Anemia, Chronic Obstructive Anemia, Chronic Obstructive N/A Comorbid History: Pulmonary Disease (COPD), Pulmonary Disease (COPD), Congestive Heart Failure, Congestive Heart Failure, Hypertension, Myocardial Infarction, Hypertension, Myocardial Infarction, Peripheral Venous Disease, Type II Peripheral Venous Disease, Type II Diabetes, Neuropathy Diabetes, Neuropathy 10/06/2021 06/15/2022 N/A Date Acquired: 18 2 N/A Weeks of Treatment: Open Open N/A Wound Status: No No N/A Wound Recurrence: 1.3x1.1x0.1 2x3.8x0.1 N/A Measurements L x W x D (cm) 1.123 5.969 N/A A (cm) : rea 0.112 0.597 N/A Volume (cm) : 74.50% 57.80% N/A % Reduction in A rea: 87.30% 57.80% N/A % Reduction in Volume: Grade 2 Grade 2 N/A Classification: Medium Medium N/A Exudate A mount: Serosanguineous Serosanguineous N/A Exudate Type: Marchetti, Jaiyla C ([id number]) [id number] Page 4 of 10 red, brown red, brown N/A Exudate Color: Distinct, outline attached Distinct, outline attached N/A Wound Margin: Large (67-100%) Large (67-100%) N/A Granulation Amount: Red, Pink Red, Pink N/A Granulation Quality: None Present (0%) Small (1-33%) N/A Necrotic Amount: Fat Layer (Subcutaneous Tissue): Yes Fat Layer (Subcutaneous Tissue): Yes N/A Exposed Structures: Fascia: No Fascia: No Tendon: No Tendon: No Muscle: No Muscle: No Joint: No Joint: No Bone: No Bone: No Medium (34-66%) Small (1-33%) N/A Epithelialization: Debridement - Excisional Debridement - Excisional N/A Debridement: Pre-procedure Verification/Time Out 16:00 16:00  N/A Taken: Lidocaine 5% topical ointment Lidocaine 5% topical ointment N/A Pain Control: Subcutaneous, Slough Subcutaneous, Slough N/A Tissue Debrided: Skin/Subcutaneous Tissue Skin/Subcutaneous Tissue N/A Level: 1.43 7.6 N/A Debridement A (sq cm): rea Curette Curette N/A Instrument: Minimum Minimum N/A Bleeding: Pressure Pressure N/A Hemostasis A chieved: Procedure was tolerated well Procedure was tolerated well N/A Debridement Treatment Response: 1.3x1.1x0.1 2x3.8x0.1 N/A Post Debridement Measurements L x W x D (cm) 0.112 0.597 N/A Post Debridement Volume: (cm) Callus: Yes No Abnormalities Noted N/A Periwound Skin Texture: Excoriation: No Induration: No Crepitus: No Rash: No Scarring: No No Abnormalities Noted Maceration: Yes N/A Periwound Skin Moisture: Dry/Scaly: No No Abnormalities Noted No Abnormalities Noted N/A Periwound Skin Color: No Abnormality No Abnormality N/A Temperature: Cellular or Tissue Based Product Cellular or Tissue Based Product N/A Procedures Performed: Compression Therapy Compression Therapy Debridement Debridement Treatment Notes Electronic Signature(s) Signed: 07/14/2022 4:32:14 PM By: Kalman Shan DO Entered By: Kalman Shan on 07/14/2022 16:24:29 -------------------------------------------------------------------------------- Multi-Disciplinary Care Plan Details Patient Name: Date of Service: [name] Lax C. 07/14/2022 3:30 PM Medical Record Number: [id number] Patient Account Number: [id number] Date of Birth/Sex: Treating RN: [birth year] (61 y.o. [name] Primary Care Dariona Postma: Jenean Lindau Other Clinician: Referring Everest Brod: Treating Iana Buzan/Extender: Waldron Session  Weeks in Treatment: 18 Active Inactive Abuse / Safety / Falls / Self Care Management Nursing Diagnoses: History of Falls Goals: Patient will not experience any injury related to falls Date Initiated: 03/05/2022 Target  Resolution Date: 07/23/2022 Goal Status: Active Interventions: Assess fall risk on admission and as needed Assess: immobility, friction, shearing, incontinence upon admission and as needed Pigue, Carlina C ([id number]) [id number] Page 5 of 10 Assess impairment of mobility on admission and as needed per policy Notes: [birth year]: Fall prevention ongoing, recent fall. Wound/Skin Impairment Nursing Diagnoses: Impaired tissue integrity Goals: Patient/caregiver will verbalize understanding of skin care regimen Date Initiated: 03/05/2022 Target Resolution Date: 08/28/2022 Goal Status: Active Ulcer/skin breakdown will have a volume reduction of 30% by week 4 Date Initiated: 03/05/2022 Date Inactivated: 04/30/2022 Target Resolution Date: 05/02/2022 Goal Status: Met Ulcer/skin breakdown will have a volume reduction of 50% by week 8 Date Initiated: 04/30/2022 Date Inactivated: 07/07/2022 Target Resolution Date: 07/04/2022 Unmet Reason: patient sent three Goal Status: Unmet weeks inpatient and rehab before returning to wound center. Interventions: Assess patient/caregiver ability to obtain necessary supplies Assess patient/caregiver ability to perform ulcer/skin care regimen upon admission and as needed Assess ulceration(s) every visit Provide education on smoking Provide education on ulcer and skin care Treatment Activities: Topical wound management initiated : 03/05/2022 Notes: 04/30/22: Wound care regimen continues. Electronic Signature(s) Signed: 07/14/2022 5:19:10 PM By: Lorrin Jackson Entered By: Lorrin Jackson on 07/14/2022 15:44:56 -------------------------------------------------------------------------------- Pain Assessment Details Patient Name: Date of Service: [name] Lax C. 07/14/2022 3:30 PM Medical Record Number: [id number] Patient Account Number: [id number] Date of Birth/Sex: Treating RN: 10-30-1960 (61 y.o. [name] Primary Care Nicandro Perrault:  Jenean Lindau Other Clinician: Referring Crew Goren: Treating Elecia Serafin/Extender: Elise Benne in Treatment: 18 Active Problems Location of Pain Severity and Description of Pain Patient Has Paino Yes Site Locations Pain Location: Hou, Filicia C ([id number]) [id number] Page 6 of 10 Pain Location: Pain in Ulcers With Dressing Change: Yes Duration of the Pain. Constant / Intermittento Intermittent Rate the pain. Current Pain Level: 4 Character of Pain Describe the Pain: Burning, Tender, Throbbing Pain Management and Medication Current Pain Management: Medication: Yes Cold Application: No Rest: Yes Massage: No Activity: No T.E.N.S.: No Heat Application: No Leg drop or elevation: No Is the Current Pain Management Adequate: Adequate How does your wound impact your activities of daily livingo Sleep: No Bathing: No Appetite: No Relationship With Others: No Bladder Continence: No Emotions: No Bowel Continence: No Work: No Toileting: No Drive: No Dressing: No Hobbies: No Electronic Signature(s) Signed: 07/14/2022 5:19:10 PM By: Lorrin Jackson Entered By: Lorrin Jackson on 07/14/2022 15:52:09 -------------------------------------------------------------------------------- Patient/Caregiver Education Details Patient Name: Date of Service: [name] 10/10/2023andnbsp3:30 PM Medical Record Number: [id number] Patient Account Number: [id number] Date of Birth/Gender: Treating RN: [birth year] (61 y.o. [name] Primary Care Physician: Jenean Lindau Other Clinician: Referring Physician: Treating Physician/Extender: Elise Benne in Treatment: 18 Education Assessment Education Provided To: Patient Education Topics Provided Smoking and Wound Healing: Methods: Explain/Verbal, Printed Responses: State content correctly Venous: Methods: Explain/Verbal, Printed Responses:  State content correctly Wound/Skin Impairment: Mccurley, Catharine C ([id number]) [id number] Page 7 of 10 Methods: Demonstration, Explain/Verbal, Printed Responses: State content correctly Electronic Signature(s) Signed: 07/14/2022 5:19:10 PM By: Lorrin Jackson Entered By: Lorrin Jackson on 07/14/2022 15:45:26 -------------------------------------------------------------------------------- Wound Assessment Details Patient Name: Date of Service: [name] Lax C. 07/14/2022 3:30 PM Medical Record Number: [id number] Patient Account Number: [id number] Date of Birth/Sex: Treating RN: 1960/11/24 (61 y.o. F)  Lorrin Jackson Primary Care Tryston Gilliam: Jenean Lindau Other Clinician: Referring Morgan Rennert: Treating Cecia Egge/Extender: Elise Benne in Treatment: 18 Wound Status Wound Number: 1 Primary Diabetic Wound/Ulcer of the Lower Extremity Etiology: Wound Location: Left Calcaneus Wound Open Wounding Event: Other Lesion Status: Date Acquired: 10/06/2021 Comorbid Anemia, Chronic Obstructive Pulmonary Disease (COPD), Weeks Of Treatment: 18 History: Congestive Heart Failure, Hypertension, Myocardial Infarction, Clustered Wound: No Peripheral Venous Disease, Type II Diabetes, Neuropathy Photos Wound Measurements Length: (cm) 1.3 Width: (cm) 1.1 Depth: (cm) 0.1 Area: (cm) 1.123 Volume: (cm) 0.112 % Reduction in Area: 74.5% % Reduction in Volume: 87.3% Epithelialization: Medium (34-66%) Tunneling: No Undermining: No Wound Description Classification: Grade 2 Wound Margin: Distinct, outline attached Exudate Amount: Medium Exudate Type: Serosanguineous Exudate Color: red, brown Foul Odor After Cleansing: No Slough/Fibrino No Wound Bed Granulation Amount: Large (67-100%) Exposed Structure Granulation Quality: Red, Pink Fascia Exposed: No Necrotic Amount: None Present (0%) Fat Layer (Subcutaneous Tissue) Exposed: Yes Tendon Exposed:  No Muscle Exposed: No Joint Exposed: No Bone Exposed: No Periwound Skin Texture Texture Color Pieratt, Cyani C ([id number]) [id number] Page 8 of 10 No Abnormalities Noted: No No Abnormalities Noted: Yes Callus: Yes Temperature / Pain Crepitus: No Temperature: No Abnormality Excoriation: No Induration: No Rash: No Scarring: No Moisture No Abnormalities Noted: Yes Treatment Notes Wound #1 (Calcaneus) Wound Laterality: Left Cleanser Soap and Water Discharge Instruction: May shower and wash wound with dial antibacterial soap and water prior to dressing change. Wound Cleanser Discharge Instruction: Cleanse the wound with wound cleanser prior to applying a clean dressing using gauze sponges, not tissue or cotton balls. Peri-Wound Care Triamcinolone 15 (g) Discharge Instruction: Use triamcinolone 15 (g) as directed Sween Lotion (Moisturizing lotion) Discharge Instruction: Apply moisturizing lotion as directed Topical Primary Dressing Epifix Discharge Instruction: applied by Soliana Kitko secured with adaptic and steri-strips. Secondary Dressing ALLEVYN Heel 4 1/2in x 5 1/2in / 10.5cm x 13.5cm Discharge Instruction: Apply over primary dressing as directed. Woven Gauze Sponge, Non-Sterile 4x4 in Discharge Instruction: Apply over primary dressing as directed. Secured With Compression Wrap ThreePress (3 layer compression wrap) Discharge Instruction: Apply three layer compression as directed. Compression Stockings Add-Ons Electronic Signature(s) Signed: 07/14/2022 5:19:10 PM By: Lorrin Jackson Entered By: Lorrin Jackson on 07/14/2022 16:01:25 -------------------------------------------------------------------------------- Wound Assessment Details Patient Name: Date of Service: [name] Lax C. 07/14/2022 3:30 PM Medical Record Number: [id number] Patient Account Number: [id number] Date of Birth/Sex: Treating RN: [birth year] (61 y.o. [name] Primary Care Shakyia Bosso: Jenean Lindau Other Clinician: Referring Semiah Konczal: Treating Shakeya Kerkman/Extender: Elise Benne in Treatment: 18 Wound Status Wound Number: 2 Primary Diabetic Wound/Ulcer of the Lower Extremity Etiology: Wound Location: Left, Distal Calcaneus Wound Open Wounding Event: Blister Status: Date Acquired: 06/15/2022 Brault, Thana C ([id number]) [id number] Page 9 of 10 Date Acquired: 06/15/2022 Comorbid Anemia, Chronic Obstructive Pulmonary Disease (COPD), Weeks Of Treatment: 2 History: Congestive Heart Failure, Hypertension, Myocardial Infarction, Clustered Wound: No Peripheral Venous Disease, Type II Diabetes, Neuropathy Photos Wound Measurements Length: (cm) 2 Width: (cm) 3.8 Depth: (cm) 0.1 Area: (cm) 5.969 Volume: (cm) 0.597 % Reduction in Area: 57.8% % Reduction in Volume: 57.8% Epithelialization: Small (1-33%) Tunneling: No Undermining: No Wound Description Classification: Grade 2 Wound Margin: Distinct, outline attached Exudate Amount: Medium Exudate Type: Serosanguineous Exudate Color: red, brown Foul Odor After Cleansing: No Slough/Fibrino Yes Wound Bed Granulation Amount: Large (67-100%) Exposed Structure Granulation Quality: Red, Pink Fascia Exposed: No Necrotic Amount: Small (1-33%) Fat Layer (Subcutaneous Tissue) Exposed: Yes Necrotic Quality: Adherent Slough Tendon Exposed:  No Muscle Exposed: No Joint Exposed: No Bone Exposed: No Periwound Skin Texture Texture Color No Abnormalities Noted: Yes No Abnormalities Noted: Yes Moisture Temperature / Pain No Abnormalities Noted: No Temperature: No Abnormality Dry / Scaly: No Maceration: Yes Treatment Notes Wound #2 (Calcaneus) Wound Laterality: Left, Distal Cleanser Soap and Water Discharge Instruction: May shower and wash wound with dial antibacterial soap and water prior to dressing change. Wound Cleanser Discharge  Instruction: Cleanse the wound with wound cleanser prior to applying a clean dressing using gauze sponges, not tissue or cotton balls. Peri-Wound Care Triamcinolone 15 (g) Discharge Instruction: Use triamcinolone 15 (g) as directed Sween Lotion (Moisturizing lotion) Discharge Instruction: Apply moisturizing lotion as directed Topical Primary Dressing Epifix placed 1/2 the wound Discharge Instruction: placed by Jordana Dugue. Santyl and Hydrofera Blue the other1/2 of wound Discharge Instruction: placed by nurse. Wolfrey, Adelena C ([id number]) [id number] Page 10 of 10 Secondary Dressing ALLEVYN Heel 4 1/2in x 5 1/2in / 10.5cm x 13.5cm Discharge Instruction: Apply over primary dressing as directed. Woven Gauze Sponge, Non-Sterile 4x4 in Discharge Instruction: Apply over primary dressing as directed. Secured With Compression Wrap ThreePress (3 layer compression wrap) Discharge Instruction: Apply three layer compression as directed. Compression Stockings Add-Ons Electronic Signature(s) Signed: 07/14/2022 5:19:10 PM By: Lorrin Jackson Entered By: Lorrin Jackson on 07/14/2022 16:02:11 -------------------------------------------------------------------------------- Vitals Details Patient Name: Date of Service: [name] Lax C. 07/14/2022 3:30 PM Medical Record Number: [id number] Patient Account Number: [id number] Date of Birth/Sex: Treating RN: November 29, 1960 (61 y.o. [name] Primary Care Clyde Upshaw: Jenean Lindau Other Clinician: Referring Jameca Chumley: Treating Mikael Skoda/Extender: Elise Benne in Treatment: 18 Vital Signs Time Taken: 15:51 Temperature (F): 98.2 Height (in): 66 Pulse (bpm): 101 Weight (lbs): 339 Respiratory Rate (breaths/min): 18 Body Mass Index (BMI): 54.7 Blood Pressure (mmHg): 149/88 Capillary Blood Glucose (mg/dl): 400 Reference Range: 80 - 120 mg / dl Electronic Signature(s) Signed: 07/14/2022 5:19:10  PM By: Lorrin Jackson Entered By: Lorrin Jackson on 07/14/2022 15:51:41

## 2022-07-14 NOTE — Progress Notes (Signed)
Markovitz, Berkleigh [name] ([id number]) [id number] Page 1 of 12 Visit Report for 07/14/2022 Chief Complaint Document Details Patient Name: Date of Service: [name], [name]. 07/14/2022 3:30 PM Medical Record Number: [id number] Patient Account Number: [id number] Date of Birth/Sex: Treating RN: May 10, 1961 (61 y.o. F) Primary Care Provider: Junious Dresserampbell, Stephen Other Clinician: Referring Provider: Treating Provider/Extender: Andree MoroHoffman, Manraj Yeo Campbell, Stephen Weeks in Treatment: 18 Information Obtained from: Patient Chief Complaint 03/05/2022; left foot wound Electronic Signature(s) Signed: 07/14/2022 4:32:14 PM By: Geralyn CorwinHoffman, Kaysen Deal DO Entered By: Geralyn CorwinHoffman, Fay Swider on 07/14/2022 16:24:35 -------------------------------------------------------------------------------- Cellular or Tissue Based Product Details Patient Name: Date of Service: [name], [name]. 07/14/2022 3:30 PM Medical Record Number: [id number] Patient Account Number: [id number] Date of Birth/Sex: Treating RN: May 10, 1961 (61 y.o. [name]) [name], [name] Primary Care Provider: Junious Dresserampbell, Stephen Other Clinician: Referring Provider: Treating Provider/Extender: Andree MoroHoffman, Sharlene Mccluskey Campbell, Stephen Weeks in Treatment: 18 Cellular or Tissue Based Product Type Wound #1 Left Calcaneus Applied to: Performed By: Physician Geralyn CorwinHoffman, Glorianne Proctor, DO Cellular or Tissue Based Product Type: Epifix Level of Consciousness (Pre-procedure): Awake and Alert Pre-procedure Verification/Time Out Yes - 16:01 Taken: Location: genitalia / hands / feet / multiple digits Wound Size (sq cm): 1.43 Product Size (sq cm): 6 Waste Size (sq cm): 4.2 Waste Reason: wound size, extra used on 2nd wound Amount of Product Applied (sq cm): 1.8 Lot #: [id number] Expiration Date: 03/06/2027 Reconstituted: Yes Solution Type: Normal Saline Solution Amount: 1ml Lot #: [id number] Solution Expiration Date: 02/01/2025 Secured: Yes Secured With:  Steri-Strips Dressing Applied: Yes Primary Dressing: Adaptic Response to Treatment: Procedure was tolerated well Level of Consciousness (Post- Awake and Alert procedure): Post Procedure Diagnosis Same as Pre-procedure [name], [name] ([id number]) [id number] Page 2 of 12 Notes Procedure scribed for Dr. Mikey BussingHoffman by Gardiner FantiJ. [name], RN Electronic Signature(s) Signed: 07/14/2022 4:32:14 PM By: Geralyn CorwinHoffman, Cari Burgo DO Signed: 07/14/2022 5:19:10 PM By: Antonieta IbaBarnhart, [name] Entered By: Antonieta IbaBarnhart, [name] on 07/14/2022 16:18:01 -------------------------------------------------------------------------------- Cellular or Tissue Based Product Details Patient Name: Date of Service: [name] BushyHUFFMA [name], [name]. 07/14/2022 3:30 PM Medical Record Number: [id number] Patient Account Number: [id number] Date of Birth/Sex: Treating RN: May 10, 1961 (61 y.o. [name]) [name], [name] Primary Care Provider: Junious Dresserampbell, Stephen Other Clinician: Referring Provider: Treating Provider/Extender: Andree MoroHoffman, Connee Ikner Campbell, Stephen Weeks in Treatment: 18 Cellular or Tissue Based Product Type Wound #2 Left,Distal Calcaneus Applied to: Performed By: Physician Geralyn CorwinHoffman, Rashaad Hallstrom, DO Cellular or Tissue Based Product Type: Epifix Level of Consciousness (Pre-procedure): Awake and Alert Pre-procedure Verification/Time Out Yes - 16:01 Taken: Location: genitalia / hands / feet / multiple digits Wound Size (sq cm): 7.6 Product Size (sq cm): 4.2 Waste Size (sq cm): 0 Amount of Product Applied (sq cm): 4.2 Instrument Used: Forceps, Scissors Lot #: [id number] Expiration Date: 03/06/2027 Reconstituted: Yes Solution Type: Normal Saline Solution Amount: 1ml Lot #: [id number] Solution Expiration Date: 02/01/2025 Secured: Yes Secured With: Steri-Strips Dressing Applied: Yes Primary Dressing: Adaptic Response to Treatment: Procedure was tolerated well Level of Consciousness (Post- Awake and Alert procedure): Post Procedure  Diagnosis Same as Pre-procedure Notes Procedure scribed for Dr. Mikey BussingHoffman by Gardiner FantiJ. [name], RN Electronic Signature(s) Signed: 07/14/2022 4:32:14 PM By: Geralyn CorwinHoffman, Patryck Kilgore DO Signed: 07/14/2022 5:19:10 PM By: Bo McclintockBarnhart, [name] Entered By: Antonieta IbaBarnhart, [name] on 07/14/2022 16:18:41 Debridement Details -------------------------------------------------------------------------------- [name], [name] ([id number]) [id number] Page 3 of 12 Patient Name: Date of Service: [name], [name]. 07/14/2022 3:30 PM Medical Record Number: [id number] Patient Account Number: [id number] Date of Birth/Sex: Treating RN: May 10, 1961 (61 y.o. [name]) [name], [name] Primary Care Provider: Junious Dresserampbell, Stephen Other Clinician:  Referring Provider: Treating Provider/Extender: [name] in Treatment: 18 Debridement Performed for Assessment: Wound #2 Left,Distal Calcaneus Performed By: Physician Geralyn Corwin, DO Debridement Type: Debridement Severity of Tissue Pre Debridement: Fat layer exposed Level of Consciousness (Pre-procedure): Awake and Alert Pre-procedure Verification/Time Out Yes - 16:00 Taken: Start Time: 16:01 Pain Control: Lidocaine 5% topical ointment T Area Debrided (L x W): otal 2 (cm) x 3.8 (cm) = 7.6 (cm) Tissue and other material debrided: Non-Viable, Slough, Subcutaneous, Slough Level: Skin/Subcutaneous Tissue Debridement Description: Excisional Instrument: Curette Bleeding: Minimum Hemostasis Achieved: Pressure End Time: 16:05 Response to Treatment: Procedure was tolerated well Level of Consciousness (Post- Awake and Alert procedure): Post Debridement Measurements of Total Wound Length: (cm) 2 Width: (cm) 3.8 Depth: (cm) 0.1 Volume: (cm) 0.597 Character of Wound/Ulcer Post Debridement: Stable Severity of Tissue Post Debridement: Fat layer exposed Post Procedure Diagnosis Same as Pre-procedure Notes Procedure scribed for Dr. Mikey Bussing by  Gardiner Fanti, RN Electronic Signature(s) Signed: 07/14/2022 4:32:14 PM By: Geralyn Corwin DO Signed: 07/14/2022 5:19:10 PM By: Antonieta Iba Entered By: Antonieta Iba on 07/14/2022 16:09:33 -------------------------------------------------------------------------------- Debridement Details Patient Name: Date of Service: [name] Bushy [name]. 07/14/2022 3:30 PM Medical Record Number: [id number] Patient Account Number: [id number] Date of Birth/Sex: Treating RN: [birth year] (61 y.o. [name] Primary Care Provider: [name] Other Clinician: Referring Provider: Treating Provider/Extender: [name] in Treatment: 18 Debridement Performed for Assessment: Wound #1 Left Calcaneus Performed By: Physician Geralyn Corwin, DO Debridement Type: Debridement Severity of Tissue Pre Debridement: Fat layer exposed Level of Consciousness (Pre-procedure): Awake and Alert Pre-procedure Verification/Time Out Yes - 16:00 Taken: Start Time: 16:05 Pain Control: Lidocaine 5% topical ointment T Area Debrided (L x W): otal 1.3 (cm) x 1.1 (cm) = 1.43 (cm) Tissue and other material debrided: Non-Viable, Slough, Subcutaneous, Slough Level: Skin/Subcutaneous Tissue Debridement Description: Excisional Instrument: Curette [name], [name] ([id number]) [id number] Page 4 of 12 Bleeding: Minimum Hemostasis Achieved: Pressure End Time: 16:08 Response to Treatment: Procedure was tolerated well Level of Consciousness (Post- Awake and Alert procedure): Post Debridement Measurements of Total Wound Length: (cm) 1.3 Width: (cm) 1.1 Depth: (cm) 0.1 Volume: (cm) 0.112 Character of Wound/Ulcer Post Debridement: Stable Severity of Tissue Post Debridement: Fat layer exposed Post Procedure Diagnosis Same as Pre-procedure Notes Procedure scribed for Dr. Mikey Bussing by Gardiner Fanti, RN Electronic Signature(s) Signed: 07/14/2022 4:32:14 PM By:  Geralyn Corwin DO Signed: 07/14/2022 5:19:10 PM By: Antonieta Iba Entered By: Antonieta Iba on 07/14/2022 16:12:48 -------------------------------------------------------------------------------- HPI Details Patient Name: Date of Service: [name] Bushy [name]. 07/14/2022 3:30 PM Medical Record Number: [id number] Patient Account Number: [id number] Date of Birth/Sex: Treating RN: June 23, 1961 (61 y.o. F) Primary Care Provider: [name] Other Clinician: Referring Provider: Treating Provider/Extender: [name] in Treatment: 18 History of Present Illness HPI Description: Admission 03/05/2022 Ms. Kayleann Schmaltz is [name] 61 year old female with [name] past medical history of uncontrolled insulin-dependent type 2 diabetes, tobacco user and chronic diastolic heart failure that presents to the clinic for [name] 46-month history of wound to her left heel. She states she had [name] left ankle fusion in September 2022. She states that she always had [name] wound after the surgery and it never healed. She has home health and they have been doing compression wraps along with silver alginate to the wound bed. She currently denies signs of infection. 6/8; this is [name] 61 year old woman with type 2 diabetes. She developed [name] wound on her left Achilles heel just above the  tip of the heel in the setting of recurrent ankle surgeries in late 2022. I have seen some of these results from either the cast or the surgical boots that are put on after these operations. She thinks this [name] be the case. And [name] sense of pressure ulcer. In any case that she has been using Santyl Hydrofera Blue under compression. She is not wearing any footwear at home as she cannot find anything to accommodate the wrap 6/22; patient presents for follow-up. She has home health that comes out once [name] week to help with dressing changes. She has no issues or complaints today. We have been using Hydrofera Blue under compression therapy. 7/6;  patient presents for follow-up. She states that home health did not come out for the past 2 weeks. It is unclear why. We have been using Hydrofera Blue and Santyl under compression therapy. 7/13; patient presents for follow-up. She did not take the oral antibiotics prescribed at last clinic visit. We have been using Hydrofera Blue with gentamicin/mupirocin ointment under compression therapy. She has no issues or complaints today. She denies signs of infection. 7/20; patient presents for follow-up. We have been using Hydrofera Blue with antibiotic ointment under 3 layer compression. She has home health that change the dressing once. They put collagen on the wound bed. She also reports falling yesterday and hitting her right foot. She has no pain to the area today. 7/27; patient presents for follow-up. We have been using Hydrofera Blue under 3 layer compression. She has home health that changes the dressing. She has no issues or complaints today. 8/3; patient presents for follow-up. We continues to use Hydrofera Blue under 3 layer compression. She has no issues or complaints today. She has been approved for Epicord at 100%. 8/11; patient presents for follow-up. We have been using Hydrofera Blue under 3 layer compression. She has been approved for Epicord and we have this today. She is in agreement with having this applied. 8/18; patient presents for follow-up. Epicord #1 was placed in standard fashion last clinic visit. She has no issues or complaints today. [name], [name] ([id number]) [id number] Page 5 of 12 9/18; Unfortunately patient has missed her last clinic appointments due to falling and breaking her right ankle. We have been following her for her left ankle wound. She has also developed [name] large blister to the left heel over the past week that has ruptured and dried. She currently resides In [facility]. Wound9/25; patient presents for follow-up. We have been using  Hydrofera Blue and Santyl to the original left ankle wound and Xeroform to the dried blistered. We have been wrapping her with compression therapy. She resides in [name] facility and they changed the wrap once this past week. She has no issues or complaints today. She denies signs of infection. 10/3; patient presents for follow-up. We have been using Xeroform to the heel wound and Epicort to the left ankle wound All under compression therapy. She has no issues or complaints today. 10/10; patient presents for follow-up. We have been using Epicort to the left ankle wound and Santyl and Hydrofera Blue to the heel wound. All under compression therapy. she has no issues or complaints today. Electronic Signature(s) Signed: 07/14/2022 4:32:14 PM By: Geralyn Corwin DO Entered By: Geralyn Corwin on 07/14/2022 16:25:10 -------------------------------------------------------------------------------- Physical Exam Details Patient Name: Date of Service: [name] Bushy [name]. 07/14/2022 3:30 PM Medical Record Number: [id number] Patient Account Number: [id number] Date of Birth/Sex: Treating RN: 03-[name]-1962 (61 y.o. F) Primary  Care Provider: [name] Other Clinician: Referring Provider: Treating Provider/Extender: [name] in Treatment: 18 Constitutional respirations regular, non-labored and within target range for patient.. Cardiovascular 2+ dorsalis pedis/posterior tibialis pulses. Psychiatric pleasant and cooperative. Notes Left foot: T the posterior ankle and heel there is open wounds with granulation tissue and nonviable tissue. No surrounding signs of infection. o Electronic Signature(s) Signed: 07/14/2022 4:32:14 PM By: Geralyn Corwin DO Entered By: Geralyn Corwin on 07/14/2022 16:25:32 -------------------------------------------------------------------------------- Physician Orders Details Patient Name: Date of Service: [name] Bushy [name]. 07/14/2022  3:30 PM Medical Record Number: [id number] Patient Account Number: [id number] Date of Birth/Sex: Treating RN: [birth year] (61 y.o. [name] Primary Care Provider: [name] Other Clinician: Referring Provider: Treating Provider/Extender: [name] in Treatment: [phone] Verbal / Phone Orders: No Diagnosis Coding Follow-up Appointments ppointment in 1 week. - Dr. Mikey Bussing and Lennox Laity, Room 7 Return [name] Other: - *****FACILITY TO NOT CHANGE DRESSINGS. LEAVE IN PLACE TO LEFT LEG/HEEL WOUNDS.*** Anesthetic (In clinic) Topical Lidocaine 5% applied to wound bed Gola, Lylian [name] ([id number]) [id number] Page 6 of 12 Cellular or Tissue Based Products Cellular or Tissue Based Product Type: - Run IVR for EpiFix and Epicord=100% covered 05/07/22: Epicord ordered daptic or Mepitel. (DO NOT REMOVE). - Cellular or Tissue Based Product applied to wound bed, secured with steri-strips, cover with [name] Epicord #1 05/15/25 Epicord #2 05/22/22 Epicord #3 06/29/2022 Epicord #4 07/07/2022 Epifix #5 07/14/22 Bathing/ Shower/ Hygiene [name] shower with protection but do not get wound dressing(s) wet. - [name] use cast protector bag from [facility], [facility] or [employer] Edema Control - Lymphedema / SCD / Other Elevate legs to the level of the heart or above for 30 minutes daily and/or when sitting, [name] frequency of: Avoid standing for long periods of time. Off-Loading Prevalon Boot - use while in bed and chair. Additional Orders / Instructions Follow Nutritious Diet - Monitor/Control Blood Sugar Wound Treatment Wound #1 - Calcaneus Wound Laterality: Left Cleanser: Soap and Water 1 x Per Week/30 Days Discharge Instructions: [name] shower and wash wound with dial antibacterial soap and water prior to dressing change. Cleanser: Wound Cleanser 1 x Per Week/30 Days Discharge Instructions: Cleanse the wound with wound cleanser prior to applying [name] clean dressing using gauze  sponges, not tissue or cotton balls. Peri-Wound Care: Triamcinolone 15 (g) 1 x Per Week/30 Days Discharge Instructions: Use triamcinolone 15 (g) as directed Peri-Wound Care: Sween Lotion (Moisturizing lotion) 1 x Per Week/30 Days Discharge Instructions: Apply moisturizing lotion as directed Prim Dressing: Epifix 1 x Per Week/30 Days ary Discharge Instructions: applied by provider secured with adaptic and steri-strips. Secondary Dressing: ALLEVYN Heel 4 1/2in x 5 1/2in / 10.5cm x 13.5cm 1 x Per Week/30 Days Discharge Instructions: Apply over primary dressing as directed. Secondary Dressing: Woven Gauze Sponge, Non-Sterile 4x4 in (Home Health) 1 x Per Week/30 Days Discharge Instructions: Apply over primary dressing as directed. Compression Wrap: ThreePress (3 layer compression wrap) (Home Health) 1 x Per Week/30 Days Discharge Instructions: Apply three layer compression as directed. Wound #2 - Calcaneus Wound Laterality: Left, Distal Cleanser: Soap and Water 1 x Per Week/30 Days Discharge Instructions: [name] shower and wash wound with dial antibacterial soap and water prior to dressing change. Cleanser: Wound Cleanser 1 x Per Week/30 Days Discharge Instructions: Cleanse the wound with wound cleanser prior to applying [name] clean dressing using gauze sponges, not tissue or cotton balls. Peri-Wound Care: Triamcinolone 15 (g) 1 x Per Week/30 Days Discharge Instructions: Use  triamcinolone 15 (g) as directed Peri-Wound Care: Sween Lotion (Moisturizing lotion) 1 x Per Week/30 Days Discharge Instructions: Apply moisturizing lotion as directed Prim Dressing: Epifix placed 1/2 the wound 1 x Per Week/30 Days ary Discharge Instructions: placed by provider. Prim Dressing: Santyl and Hydrofera Blue the other1/2 of wound ary 1 x Per Week/30 Days Discharge Instructions: placed by nurse. Secondary Dressing: ALLEVYN Heel 4 1/2in x 5 1/2in / 10.5cm x 13.5cm 1 x Per Week/30 Days Discharge Instructions: Apply over  primary dressing as directed. Secondary Dressing: Woven Gauze Sponge, Non-Sterile 4x4 in (Home Health) 1 x Per Week/30 Days Discharge Instructions: Apply over primary dressing as directed. Compression Wrap: ThreePress (3 layer compression wrap) (Home Health) 1 x Per Week/30 Days Discharge Instructions: Apply three layer compression as directed. Bensch, Deysy [name] ([id number]) [id number] Page 7 of 12 Electronic Signature(s) Signed: 07/14/2022 4:32:14 PM By: Geralyn Corwin DO Entered By: Geralyn Corwin on 07/14/2022 16:25:40 -------------------------------------------------------------------------------- Problem List Details Patient Name: Date of Service: [name] Bushy [name]. 07/14/2022 3:30 PM Medical Record Number: [id number] Patient Account Number: [id number] Date of Birth/Sex: Treating RN: 1961/04/29 (61 y.o. F) Primary Care Provider: [name] Other Clinician: Referring Provider: Treating Provider/Extender: [name] in Treatment: 18 Active Problems ICD-10 Encounter Code Description Active Date MDM Diagnosis [phone] Non-pressure chronic ulcer of other part of left foot with fat layer exposed 03/05/2022 No Yes E11.621 Type 2 diabetes mellitus with foot ulcer 03/05/2022 No Yes E66.01 Morbid (severe) obesity due to excess calories 03/05/2022 No Yes Inactive Problems Resolved Problems Electronic Signature(s) Signed: 07/14/2022 4:32:14 PM By: Geralyn Corwin DO Entered By: Geralyn Corwin on 07/14/2022 16:24:24 -------------------------------------------------------------------------------- Progress Note Details Patient Name: Date of Service: [name] Bushy [name]. 07/14/2022 3:30 PM Medical Record Number: [id number] Patient Account Number: [id number] Date of Birth/Sex: Treating RN: 10-Jun-1961 (61 y.o. F) Primary Care Provider: [name] Other Clinician: Referring Provider: Treating Provider/Extender: [name] in Treatment: 18 Subjective Chief Complaint Information obtained from Patient 03/05/2022; left foot wound History of Present Illness (HPI) Admission 03/05/2022 Somera, Narjis [name] ([id number]) [id number] Page 8 of 12 Ms. Doni Coonrod is [name] 61 year old female with [name] past medical history of uncontrolled insulin-dependent type 2 diabetes, tobacco user and chronic diastolic heart failure that presents to the clinic for [name] 55-month history of wound to her left heel. She states she had [name] left ankle fusion in September 2022. She states that she always had [name] wound after the surgery and it never healed. She has home health and they have been doing compression wraps along with silver alginate to the wound bed. She currently denies signs of infection. 6/8; this is [name] 61 year old woman with type 2 diabetes. She developed [name] wound on her left Achilles heel just above the tip of the heel in the setting of recurrent ankle surgeries in late 2022. I have seen some of these results from either the cast or the surgical boots that are put on after these operations. She thinks this [name] be the case. And [name] sense of pressure ulcer. In any case that she has been using Santyl Hydrofera Blue under compression. She is not wearing any footwear at home as she cannot find anything to accommodate the wrap 6/22; patient presents for follow-up. She has home health that comes out once [name] week to help with dressing changes. She has no issues or complaints today. We have been using Hydrofera Blue under compression therapy. 7/6; patient presents for follow-up. She states that  home health did not come out for the past 2 weeks. It is unclear why. We have been using Hydrofera Blue and Santyl under compression therapy. 7/13; patient presents for follow-up. She did not take the oral antibiotics prescribed at last clinic visit. We have been using Hydrofera Blue with gentamicin/mupirocin  ointment under compression therapy. She has no issues or complaints today. She denies signs of infection. 7/20; patient presents for follow-up. We have been using Hydrofera Blue with antibiotic ointment under 3 layer compression. She has home health that change the dressing once. They put collagen on the wound bed. She also reports falling yesterday and hitting her right foot. She has no pain to the area today. 7/27; patient presents for follow-up. We have been using Hydrofera Blue under 3 layer compression. She has home health that changes the dressing. She has no issues or complaints today. 8/3; patient presents for follow-up. We continues to use Hydrofera Blue under 3 layer compression. She has no issues or complaints today. She has been approved for Epicord at 100%. 8/11; patient presents for follow-up. We have been using Hydrofera Blue under 3 layer compression. She has been approved for Epicord and we have this today. She is in agreement with having this applied. 8/18; patient presents for follow-up. Epicord #1 was placed in standard fashion last clinic visit. She has no issues or complaints today. 9/18; Unfortunately patient has missed her last clinic appointments due to falling and breaking her right ankle. We have been following her for her left ankle wound. She has also developed [name] large blister to the left heel over the past week that has ruptured and dried. She currently resides In [facility]. Wound9/25; patient presents for follow-up. We have been using Hydrofera Blue and Santyl to the original left ankle wound and Xeroform to the dried blistered. We have been wrapping her with compression therapy. She resides in [name] facility and they changed the wrap once this past week. She has no issues or complaints today. She denies signs of infection. 10/3; patient presents for follow-up. We have been using Xeroform to the heel wound and Epicort to the left ankle wound All under compression  therapy. She has no issues or complaints today. 10/10; patient presents for follow-up. We have been using Epicort to the left ankle wound and Santyl and Hydrofera Blue to the heel wound. All under compression therapy. she has no issues or complaints today. Patient History Information obtained from Patient, Chart. Family History Cancer - Father,Mother, Diabetes - Mother,Maternal Grandparents,Paternal Grandparents, Heart Disease - Siblings,Maternal Grandparents, Hypertension - Siblings, Tuberculosis - Maternal Grandparents,Paternal Grandparents. Social History Current every day smoker, Marital Status - Single, Alcohol Use - Never, Drug Use - No History, Caffeine Use - Daily. 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 Cardiovascular 2+ dorsalis pedis/posterior  tibialis pulses. Psychiatric pleasant and cooperative. General Notes: Left foot: T the posterior ankle and heel there is open wounds with granulation tissue and nonviable tissue. No surrounding signs of  infection. o Integumentary (Hair, Skin) Wound #1 status is Open. Original cause of wound was Other Lesion. The date acquired was: 10/06/2021. The wound has been in treatment 18 weeks. The wound is located on the Left Calcaneus. The wound measures 1.3cm length x 1.1cm width x 0.1cm depth; 1.123cm^2 area and 0.112cm^3 volume. There is Fat Layer (Subcutaneous Tissue) exposed. There is no tunneling or undermining noted. There is [name] medium amount of serosanguineous drainage noted. The wound margin is distinct with the outline attached to the wound base. There is large (67-100%) red, pink granulation within the wound bed. There is no necrotic tissue within the wound bed. The periwound skin appearance had no abnormalities noted for moisture. The periwound skin appearance had no abnormalities noted for color. The periwound skin appearance exhibited: Callus. The periwound skin appearance did not exhibit: Crepitus, Excoriation, Induration, Rash, Scarring. Periwound temperature was noted as No Abnormality. Wound #2 status is Open. Original cause of wound was Blister. The date acquired was: 06/15/2022. The wound has been in treatment 2 weeks. The wound is located on the Left,Distal Calcaneus. The wound measures 2cm length x 3.8cm width x 0.1cm depth; 5.969cm^2 area and 0.597cm^3 volume. There is Fat Layer (Subcutaneous Tissue) exposed. There is no tunneling or undermining noted. There is [name] medium amount of serosanguineous drainage noted. The wound margin is distinct with the outline attached to the wound base. There is large (67-100%) red, pink granulation within the wound bed. There is [name] small (1-33%) amount of necrotic tissue within the wound bed including Adherent Slough. The periwound skin appearance had no abnormalities noted for texture. The periwound skin appearance had no abnormalities noted for color. The periwound skin appearance exhibited: Maceration. The periwound skin appearance did not exhibit:  Dry/Scaly. Periwound temperature was noted as No Abnormality. Assessment Active Problems ICD-10 Non-pressure chronic ulcer of other part of left foot with fat layer exposed Type 2 diabetes mellitus with foot ulcer Morbid (severe) obesity due to excess calories Patient's wounds have shown improvement in size and appearance since last clinic visit. I debrided nonviable tissue. EpiFix was placed in standard fashion to the ankle wound and Santyl and Hydrofera Blue to the remaining heel wound. Part of the EpiFix was able to be placed on the heel wound. Compression therapy was continued. Follow-up in 1 week. Procedures Wound #1 Pre-procedure diagnosis of Wound #1 is [name] Diabetic Wound/Ulcer of the Lower Extremity located on the Left Calcaneus .Severity of Tissue Pre Debridement is: Fat layer exposed. There was [name] Excisional Skin/Subcutaneous Tissue Debridement with [name] total area of 1.43 sq cm performed by Kalman Shan, DO. With the following instrument(s): Curette to remove Non-Viable tissue/material. Material removed includes Subcutaneous Tissue and Slough and after achieving pain control using Lidocaine 5% topical ointment. No specimens were taken. [name] time out was conducted at 16:00, prior to the start of the procedure. [name] Minimum amount of bleeding was controlled with Pressure. The procedure was tolerated well. Post Debridement Measurements: 1.3cm length x 1.1cm width x 0.1cm depth; 0.112cm^3 volume. Character of Wound/Ulcer Post Debridement is stable. Severity of Tissue Post Debridement is: Fat layer exposed. Post procedure Diagnosis Wound #1: Same as Pre-Procedure General Notes: Procedure scribed for Dr. [facility] by Janan Halter, RN. Pre-procedure diagnosis of Wound #1 is [name] Diabetic Wound/Ulcer of the Lower Extremity located on the  Left Calcaneus. [name] skin graft procedure using [name] bioengineered skin substitute/cellular or tissue based product was performed by Geralyn Corwin, DO. Epifix was applied  and secured with Steri-Strips. 1.8 sq cm of product was utilized and 4.2 sq cm was wasted due to wound size, extra used on 2nd wound. Post Application, Adaptic was applied. [name] Time Out was conducted at 16:01, prior to the start of the procedure. The procedure was tolerated well. Post procedure Diagnosis Wound #1: Same as Pre-Procedure General Notes: Procedure scribed for Dr. Mikey Bussing by Gardiner Fanti, RN. Pre-procedure diagnosis of Wound #1 is [name] Diabetic Wound/Ulcer of the Lower Extremity located on the Left Calcaneus . There was [name] Three Layer Compression Therapy Procedure by Antonieta Iba, RN. Post procedure Diagnosis Wound #1: Same as Pre-Procedure Wound #2 Pre-procedure diagnosis of Wound #2 is [name] Diabetic Wound/Ulcer of the Lower Extremity located on the Left,Distal Calcaneus .Severity of Tissue Pre Debridement is: Fat layer exposed. There was [name] Excisional Skin/Subcutaneous Tissue Debridement with [name] total area of 7.6 sq cm performed by Geralyn Corwin, DO. With the following instrument(s): Curette to remove Non-Viable tissue/material. Material removed includes Subcutaneous Tissue and Slough and after achieving pain control using Lidocaine 5% topical ointment. No specimens were taken. [name] time out was conducted at 16:00, prior to the start of the procedure. [name] Minimum amount of bleeding was controlled with Pressure. The procedure was tolerated well. Post Debridement Measurements: 2cm length x 3.8cm width x 0.1cm depth; 0.597cm^3 volume. Character of Wound/Ulcer Post Debridement is stable. Severity of Tissue Post Debridement is: Fat layer exposed. Post procedure Diagnosis Wound #2: Same as Pre-Procedure General Notes: Procedure scribed for Dr. Mikey Bussing by Gardiner Fanti, RN. Pre-procedure diagnosis of Wound #2 is [name] Diabetic Wound/Ulcer of the Lower Extremity located on the Left,Distal Calcaneus. [name] skin graft procedure using [name] [name], [name] ([id number]) [id number] Page 10 of  12 bioengineered skin substitute/cellular or tissue based product was performed by Geralyn Corwin, DO with the following instrument(s): Forceps and Scissors. Epifix was applied and secured with Steri-Strips. 4.2 sq cm of product was utilized and 0 sq cm was wasted. Post Application, Adaptic was applied. [name] Time Out was conducted at 16:01, prior to the start of the procedure. The procedure was tolerated well. Post procedure Diagnosis Wound #2: Same as Pre-Procedure General Notes: Procedure scribed for Dr. Mikey Bussing by Gardiner Fanti, RN. Pre-procedure diagnosis of Wound #2 is [name] Diabetic Wound/Ulcer of the Lower Extremity located on the Left,Distal Calcaneus . There was [name] Three Layer Compression Therapy Procedure by Antonieta Iba, RN. Post procedure Diagnosis Wound #2: Same as Pre-Procedure Plan Follow-up Appointments: Return Appointment in 1 week. - Dr. Mikey Bussing and Lennox Laity, Room 7 Other: - *****FACILITY TO NOT CHANGE DRESSINGS. LEAVE IN PLACE TO LEFT LEG/HEEL WOUNDS.*** Anesthetic: (In clinic) Topical Lidocaine 5% applied to wound bed Cellular or Tissue Based Products: Cellular or Tissue Based Product Type: - Run IVR for EpiFix and Epicord=100% covered 05/07/22: Epicord ordered Cellular or Tissue Based Product applied to wound bed, secured with steri-strips, cover with Adaptic or Mepitel. (DO NOT REMOVE). - Epicord #1 05/15/25 Epicord #2 05/22/22 Epicord #3 06/29/2022 Epicord #4 07/07/2022 Epifix #5 07/14/22 Bathing/ Shower/ Hygiene: [name] shower with protection but do not get wound dressing(s) wet. - [name] use cast protector bag from [facility], [facility] or [employer] Edema Control - Lymphedema / SCD / Other: Elevate legs to the level of the heart or above for 30 minutes daily and/or when sitting, [name] frequency of: Avoid standing for long periods of  time. Off-Loading: Prevalon Boot - use while in bed and chair. Additional Orders / Instructions: Follow Nutritious Diet - Monitor/Control Blood Sugar WOUND #1: -  Calcaneus Wound Laterality: Left Cleanser: Soap and Water 1 x Per Week/30 Days Discharge Instructions: [name] shower and wash wound with dial antibacterial soap and water prior to dressing change. Cleanser: Wound Cleanser 1 x Per Week/30 Days Discharge Instructions: Cleanse the wound with wound cleanser prior to applying [name] clean dressing using gauze sponges, not tissue or cotton balls. Peri-Wound Care: Triamcinolone 15 (g) 1 x Per Week/30 Days Discharge Instructions: Use triamcinolone 15 (g) as directed Peri-Wound Care: Sween Lotion (Moisturizing lotion) 1 x Per Week/30 Days Discharge Instructions: Apply moisturizing lotion as directed Prim Dressing: Epifix 1 x Per Week/30 Days ary Discharge Instructions: applied by provider secured with adaptic and steri-strips. Secondary Dressing: ALLEVYN Heel 4 1/2in x 5 1/2in / 10.5cm x 13.5cm 1 x Per Week/30 Days Discharge Instructions: Apply over primary dressing as directed. Secondary Dressing: Woven Gauze Sponge, Non-Sterile 4x4 in (Home Health) 1 x Per Week/30 Days Discharge Instructions: Apply over primary dressing as directed. Com pression Wrap: ThreePress (3 layer compression wrap) (Home Health) 1 x Per Week/30 Days Discharge Instructions: Apply three layer compression as directed. WOUND #2: - Calcaneus Wound Laterality: Left, Distal Cleanser: Soap and Water 1 x Per Week/30 Days Discharge Instructions: [name] shower and wash wound with dial antibacterial soap and water prior to dressing change. Cleanser: Wound Cleanser 1 x Per Week/30 Days Discharge Instructions: Cleanse the wound with wound cleanser prior to applying [name] clean dressing using gauze sponges, not tissue or cotton balls. Peri-Wound Care: Triamcinolone 15 (g) 1 x Per Week/30 Days Discharge Instructions: Use triamcinolone 15 (g) as directed Peri-Wound Care: Sween Lotion (Moisturizing lotion) 1 x Per Week/30 Days Discharge Instructions: Apply moisturizing lotion as directed Prim Dressing:  Epifix placed 1/2 the wound 1 x Per Week/30 Days ary Discharge Instructions: placed by provider. Prim Dressing: Santyl and Hydrofera Blue the other1/2 of wound 1 x Per Week/30 Days ary Discharge Instructions: placed by nurse. Secondary Dressing: ALLEVYN Heel 4 1/2in x 5 1/2in / 10.5cm x 13.5cm 1 x Per Week/30 Days Discharge Instructions: Apply over primary dressing as directed. Secondary Dressing: Woven Gauze Sponge, Non-Sterile 4x4 in (Home Health) 1 x Per Week/30 Days Discharge Instructions: Apply over primary dressing as directed. Com pression Wrap: ThreePress (3 layer compression wrap) (Home Health) 1 x Per Week/30 Days Discharge Instructions: Apply three layer compression as directed. 1. In office sharp debridement 2. EpiFix was placed in standard fashion today 3. Santyl and Hydrofera Blue 4. 3 layer compression 5. Follow-up in 1 week Electronic Signature(s) Signed: 07/14/2022 4:32:14 PM By: Geralyn Corwin DO Entered By: Geralyn Corwin on 07/14/2022 16:27:29 [name], [name] ([id number]) [id number] Page 11 of 12 -------------------------------------------------------------------------------- HxROS Details Patient Name: Date of Service: [name] Bushy [name]. 07/14/2022 3:30 PM Medical Record Number: [id number] Patient Account Number: [id number] Date of Birth/Sex: Treating RN: 1961/02/02 (61 y.o. F) Primary Care Provider: [name] Other Clinician: Referring Provider: Treating Provider/Extender: [name] in Treatment: 18 Information Obtained From Patient Chart Hematologic/Lymphatic Medical History: Positive for: Anemia Respiratory Medical History: Positive for: Chronic Obstructive Pulmonary Disease (COPD) Cardiovascular Medical History: Positive for: Congestive Heart Failure - Weighs self daily; Hypertension; Myocardial Infarction; Peripheral Venous Disease Gastrointestinal Medical History: Past Medical  History Notes: GERD, Peptic Ulcer Disease Endocrine Medical History: Positive for: Type II Diabetes Time with diabetes: 16 years Treated with: Insulin, Oral agents Blood  sugar tested every day: Yes Tested : Twice daily Musculoskeletal Medical History: Past Medical History Notes: Broke left leg 3 years ago, 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, foot fused Neurologic Medical History: Positive for: Neuropathy Psychiatric Medical History: Past Medical History Notes: Depression Immunizations Pneumococcal Vaccine: Received Pneumococcal Vaccination: No Implantable Devices None Family and Social History Cancer: Yes - Father,Mother; Diabetes: Yes - Mother,Maternal Grandparents,Paternal Grandparents; Heart Disease: Yes - Siblings,Maternal Grandparents; Hypertension: Yes - Siblings; Tuberculosis: Yes - Maternal Grandparents,Paternal Grandparents; Current every day smoker; Marital Status - Single; Alcohol Use: Never; Drug Use: No History; Caffeine Use: Daily; Financial Concerns: No; Food, Clothing or Shelter Needs: No; Support System Lacking: No; Transportation Concerns: No [name], Alexandria [name] ([id number]) [id number] Page 12 of 12 Electronic Signature(s) Signed: 07/14/2022 4:32:14 PM By: Geralyn Corwin DO Entered By: Geralyn Corwin on 07/14/2022 16:25:18 -------------------------------------------------------------------------------- SuperBill Details Patient Name: Date of Service: [name] Bushy [name]. 07/14/2022 Medical Record Number: [id number] Patient Account Number: [id number] Date of Birth/Sex: Treating RN: April 28, 1961 (61 y.o. [name] Primary Care Provider: [name] Other Clinician: Referring Provider: Treating Provider/Extender: [name] in Treatment: 18 Diagnosis Coding ICD-10 Codes Code Description [phone] Non-pressure chronic ulcer of other part of left foot with fat layer exposed E11.621 Type 2 diabetes  mellitus with foot ulcer E66.01 Morbid (severe) obesity due to excess calories Facility Procedures : CPT4 Code: 23343568 Description: Q4186 Epifix 2cm x 3cm Modifier: Quantity: 6 : CPT4 Code: 61683729 Description: 15275 - SKIN SUB GRAFT FACE/NK/HF/G ICD-10 Diagnosis Description L97.522 Non-pressure chronic ulcer of other part of left foot with fat layer exposed Modifier: Quantity: 1 Physician Procedures : CPT4 Code Description Modifier [id number] 15275 - WC PHYS SKIN SUB GRAFT FACE/NK/HF/G ICD-10 Diagnosis Description L97.522 Non-pressure chronic ulcer of other part of left foot with fat layer exposed Quantity: 1 Electronic Signature(s) Signed: 07/14/2022 4:32:14 PM By: Geralyn Corwin DO Entered By: Geralyn Corwin on 07/14/2022 16:28:10

## 2022-07-20 ENCOUNTER — Encounter (HOSPITAL_BASED_OUTPATIENT_CLINIC_OR_DEPARTMENT_OTHER): Payer: 59 | Admitting: Internal Medicine

## 2022-07-20 DIAGNOSIS — L97522 Non-pressure chronic ulcer of other part of left foot with fat layer exposed: Secondary | ICD-10-CM

## 2022-07-20 DIAGNOSIS — E11621 Type 2 diabetes mellitus with foot ulcer: Secondary | ICD-10-CM | POA: Diagnosis not present

## 2022-07-20 NOTE — Progress Notes (Signed)
Enerson, Zayonna [name] ([id number]) [id number] Page 1 of 11 Visit Report for 07/20/2022 Arrival Information Details Patient Name: Date of Service: [name]. 07/20/2022 8:00 [name] Medical Record Number: [id number] Patient Account Number: [id number] Date of Birth/Sex: Treating RN: 25-Jul-1961 (61 y.o. [name] Primary Care [name]: [name] Other Clinician: Referring Braxdon Gappa: Treating Samyria Rudie/Extender: Elise Benne in Treatment: 2 Visit Information History Since Last Visit Added or deleted any medications: No Patient Arrived: Wheel Chair Any new allergies or adverse reactions: No Arrival Time: 08:02 Had [name] fall or experienced change in No Transfer Assistance: None activities of daily living that may affect Patient Identification Verified: Yes risk of [name]: Secondary Verification Process Completed: Yes Signs or symptoms of abuse/neglect since last visito No Patient Requires Transmission-Based Precautions: No Hospitalized since last visit: No Patient Has Alerts: No Implantable device outside of the clinic excluding No cellular tissue based products placed in the center since last visit: Has Dressing in Place as Prescribed: Yes Has Compression in Place as Prescribed: Yes Pain Present Now: No Electronic Signature(s) Signed: 07/20/2022 1:50:52 PM By: Lorrin Jackson Entered By: Lorrin Jackson on 07/20/2022 08:07:45 -------------------------------------------------------------------------------- Compression Therapy Details Patient Name: Date of Service: [name]. 07/20/2022 8:00 [name] Medical Record Number: [id number] Patient Account Number: [id number] Date of Birth/Sex: Treating RN: 1961/08/03 (61 y.o. [name] Primary Care Valine Drozdowski: [name] Other Clinician: Referring Maryella Abood: Treating Omaree Fuqua/Extender: Elise Benne in Treatment: 19 Compression Therapy  Performed for Wound Assessment: Wound #1 Left Calcaneus Performed By: Clinician Lorrin Jackson, RN Compression Type: Three Layer Post Procedure Diagnosis Same as Pre-procedure Electronic Signature(s) Signed: 07/20/2022 1:50:52 PM By: Lorrin Jackson Entered By: Lorrin Jackson on 07/20/2022 08:45:30 [name] ([id number]) [id number] Page 2 of 11 -------------------------------------------------------------------------------- Compression Therapy Details Patient Name: Date of Service: [name]. 07/20/2022 8:00 [name] Medical Record Number: [id number] Patient Account Number: [id number] Date of Birth/Sex: Treating RN: 12/08/1960 (61 y.o. [name] Primary Care Sybel Standish: [name] Other Clinician: Referring Dorsie Sethi: Treating Neriyah Cercone/Extender: Elise Benne in Treatment: 19 Compression Therapy Performed for Wound Assessment: Wound #2 Left,Distal Calcaneus Performed By: Clinician Lorrin Jackson, RN Compression Type: Three Layer Post Procedure Diagnosis Same as Pre-procedure Electronic Signature(s) Signed: 07/20/2022 1:50:52 PM By: Lorrin Jackson Entered By: Lorrin Jackson on 07/20/2022 08:45:30 -------------------------------------------------------------------------------- Encounter Discharge Information Details Patient Name: Date of Service: [name]. 07/20/2022 8:00 [name] Medical Record Number: [id number] Patient Account Number: [id number] Date of Birth/Sex: Treating RN: July 28, 1961 (62 y.o. [name] Primary Care Ayaka Andes: [name] Other Clinician: Referring Suezette Lafave: Treating Shriyan Arakawa/Extender: Elise Benne in Treatment: 19 Encounter Discharge Information Items Post Procedure Vitals Discharge Condition: Stable Temperature (F): 98 Ambulatory Status: Wheelchair Pulse (bpm): 109 Discharge Destination: [place] Respiratory Rate  (breaths/min): 18 Orders Sent: Yes Blood Pressure (mmHg): 128/77 Transportation: Private Auto Schedule Follow-up Appointment: Yes Clinical Summary of Care: Provided on 07/20/2022 Form Type Recipient Paper Patient Patient Electronic Signature(s) Signed: 07/20/2022 1:50:52 PM By: Lorrin Jackson Entered By: Lorrin Jackson on 07/20/2022 09:10:14 -------------------------------------------------------------------------------- Lower Extremity Assessment Details Patient Name: Date of Service: [name]. 07/20/2022 8:00 [name] Medical Record Number: [id number] Patient Account Number: [id number] Date of Birth/Sex: Treating RN: [birth year] (61 y.o. [name] Primary Care Cordney Barstow: [name] Other Clinician: Referring Kambryn Dapolito: Treating Delron Comer/Extender: Elise Benne in Treatment: 19 Edema Assessment Assessed: [name]: Yes] [Right: No] Edema: [  Left: Ye] [Right: s] H[Left: UFFMAN, [name] ([id number])] [Right: [id number] Page 3 of 11] Calf Left: Right: Point of Measurement: 28 cm From Medial Instep 49.6 cm Ankle Left: Right: Point of Measurement: 3 cm From Medial Instep 31.6 cm Vascular Assessment Pulses: Dorsalis Pedis Palpable: [Left:Yes] Electronic Signature(s) Signed: 07/20/2022 1:50:52 PM By: Lorrin Jackson Entered By: Lorrin Jackson on 07/20/2022 08:14:42 -------------------------------------------------------------------------------- Multi Wound Chart Details Patient Name: Date of Service: [name]. 07/20/2022 8:00 [name] Medical Record Number: [id number] Patient Account Number: [id number] Date of Birth/Sex: Treating RN: 25-May-1961 (61 y.o. F) Primary Care Keishawna Carranza: [name] Other Clinician: Referring Nihal Doan: Treating Lloyd Ayo/Extender: Elise Benne in Treatment: 19 Vital Signs Height(in): 76 Capillary Blood Glucose(mg/dl): 280 Weight(lbs): 339 Pulse(bpm):  109 Body Mass Index(BMI): 54.7 Blood Pressure(mmHg): 128/77 Temperature(F): 98 Respiratory Rate(breaths/min): 18 [1:Photos:] [[name]:[name]] Left Calcaneus Left, Distal Calcaneus [name]: Other Lesion Blister [name] Wounding Event: Diabetic Wound/Ulcer of the Lower Diabetic Wound/Ulcer of the Lower [name] Primary Etiology: Extremity Extremity Anemia, Chronic Obstructive Anemia, Chronic Obstructive [name] Comorbid History: Pulmonary Disease (COPD), Pulmonary Disease (COPD), Congestive Heart Failure, Congestive Heart Failure, Hypertension, Myocardial Infarction, Hypertension, Myocardial Infarction, Peripheral Venous Disease, Type II Peripheral Venous Disease, Type II Diabetes, Neuropathy Diabetes, Neuropathy 10/06/2021 06/15/2022 [name] Date Acquired: 19 3 [name] Weeks of Treatment: Open Open [name] Wound Status: No No [name] Wound Recurrence: 0.9x0.6x0.1 1.8x2x0.1 [name] Measurements L x W x D (cm) 0.424 2.827 [name] (cm) : rea 0.042 0.283 [name] Volume (cm) : 90.40% 80.00% [name] % Reduction in [name] rea: 95.20% 80.00% [name] % Reduction in Volume: Grade 2 Grade 2 [name] Classification: Medium Medium [name] Exudate [name] mount: Serosanguineous Serosanguineous [name] Exudate Type: Sobalvarro, Jaide [name] ([id number]) [id number] Page 4 of 11 red, brown red, brown [name] Exudate Color: Distinct, outline attached Distinct, outline attached [name] Wound Margin: Large (67-100%) Large (67-100%) [name] Granulation Amount: Red, Pink Red, Pink [name] Granulation Quality: Small (1-33%) Small (1-33%) [name] Necrotic Amount: Fat Layer (Subcutaneous Tissue): Yes Fat Layer (Subcutaneous Tissue): Yes [name] Exposed Structures: Fascia: No Fascia: No Tendon: No Tendon: No Muscle: No Muscle: No Joint: No Joint: No Bone: No Bone: No Medium (34-66%) Small (1-33%) [name] Epithelialization: Debridement - Excisional Debridement - Excisional [name] Debridement: Pre-procedure Verification/Time Out 08:29 08:29  [name] Taken: Lidocaine 4% Topical Solution Lidocaine 4% Topical Solution [name] Pain Control: Subcutaneous, Slough Subcutaneous, Slough [name] Tissue Debrided: Skin/Subcutaneous Tissue Skin/Subcutaneous Tissue [name] Level: 0.54 3.6 [name] Debridement [name] (sq cm): rea Curette Curette [name] Instrument: Minimum Minimum [name] Bleeding: Pressure Pressure [name] Hemostasis [name] chieved: Procedure was tolerated well Procedure was tolerated well [name] Debridement Treatment Response: 0.9x0.6x0.1 1.8x2x0.1 [name] Post Debridement Measurements L x W x D (cm) 0.042 0.283 [name] Post Debridement Volume: (cm) Callus: Yes No Abnormalities Noted [name] Periwound Skin Texture: Excoriation: No Induration: No Crepitus: No Rash: No Scarring: No Maceration: Yes Maceration: Yes [name] Periwound Skin Moisture: Dry/Scaly: No Dry/Scaly: No No Abnormalities Noted No Abnormalities Noted [name] Periwound Skin Color: No Abnormality No Abnormality [name] Temperature: Cellular or Tissue Based Product Compression Therapy [name] Procedures Performed: Compression Therapy Debridement Debridement Treatment Notes Wound #1 (Calcaneus) Wound Laterality: Left Cleanser Soap and Water Discharge Instruction: May shower and wash wound with dial antibacterial soap and water prior to dressing change. Wound Cleanser Discharge Instruction: Cleanse the wound with wound cleanser prior to applying [name] clean dressing using gauze sponges, not tissue or cotton balls. Peri-Wound Care Triamcinolone 15 (g) Discharge Instruction: Use triamcinolone 15 (g) as directed Sween Lotion (Moisturizing  lotion) Discharge Instruction: Apply moisturizing lotion as directed Topical Primary Dressing Epicord Discharge Instruction: applied by Chrisie Jankovich secured with adaptic and steri-strips. Secondary Dressing ALLEVYN Heel 4 1/2in x 5 1/2in / 10.5cm x 13.5cm Discharge Instruction: Apply over primary dressing as directed. Woven Gauze Sponge, Non-Sterile 4x4 in Discharge  Instruction: Apply over primary dressing as directed. Secured With Compression Wrap ThreePress (3 layer compression wrap) Discharge Instruction: Apply three layer compression as directed. Compression Stockings Add-Ons Wound #2 (Calcaneus) Wound Laterality: Left, Distal Stallone, Linet [name] ([id number]) [id number] Page 5 of 11 Cleanser Soap and Water Discharge Instruction: May shower and wash wound with dial antibacterial soap and water prior to dressing change. Wound Cleanser Discharge Instruction: Cleanse the wound with wound cleanser prior to applying [name] clean dressing using gauze sponges, not tissue or cotton balls. Peri-Wound Care Triamcinolone 15 (g) Discharge Instruction: Use triamcinolone 15 (g) as directed Sween Lotion (Moisturizing lotion) Discharge Instruction: Apply moisturizing lotion as directed Topical Primary Dressing Epicord Discharge Instruction: placed by Lavaughn Bisig. Secondary Dressing ALLEVYN Heel 4 1/2in x 5 1/2in / 10.5cm x 13.5cm Discharge Instruction: Apply over primary dressing as directed. Woven Gauze Sponge, Non-Sterile 4x4 in Discharge Instruction: Apply over primary dressing as directed. Secured With Compression Wrap ThreePress (3 layer compression wrap) Discharge Instruction: Apply three layer compression as directed. Compression Stockings Add-Ons Electronic Signature(s) Signed: 07/20/2022 9:25:17 AM By: Kalman Shan DO Entered By: Kalman Shan on 07/20/2022 09:12:58 -------------------------------------------------------------------------------- Multi-Disciplinary Care Plan Details Patient Name: Date of Service: [name]. 07/20/2022 8:00 [name] Medical Record Number: [id number] Patient Account Number: [id number] Date of Birth/Sex: Treating RN: 10-24-1960 (61 y.o. [name] Primary Care Jamarkis Branam: [name] Other Clinician: Referring Conner Muegge: Treating Krrish Freund/Extender: Elise Benne in Treatment: 19 Active Inactive Abuse / Safety / [name] / Self Care Management Nursing Diagnoses: History of [name] Goals: Patient will not experience any injury related to [name] Date Initiated: 03/05/2022 Target Resolution Date: 07/23/2022 Goal Status: Active Interventions: Assess fall risk on admission and as needed Assess: immobility, friction, shearing, incontinence upon admission and as needed Assess impairment of mobility on admission and as needed per policy Shirkey, Lataunya [name] ([id number]) [id number] Page 6 of 11 Notes: 04/30/22: Fall prevention ongoing, recent fall. Wound/Skin Impairment Nursing Diagnoses: Impaired tissue integrity Goals: Patient/caregiver will verbalize understanding of skin care regimen Date Initiated: 03/05/2022 Target Resolution Date: 08/28/2022 Goal Status: Active Ulcer/skin breakdown will have [name] volume reduction of 30% by week 4 Date Initiated: 03/05/2022 Date Inactivated: 04/30/2022 Target Resolution Date: 05/02/2022 Goal Status: Met Ulcer/skin breakdown will have [name] volume reduction of 50% by week 8 Date Initiated: 04/30/2022 Date Inactivated: 07/07/2022 Target Resolution Date: 07/04/2022 Unmet Reason: patient sent three Goal Status: Unmet weeks inpatient and rehab before returning to wound center. Interventions: Assess patient/caregiver ability to obtain necessary supplies Assess patient/caregiver ability to perform ulcer/skin care regimen upon admission and as needed Assess ulceration(s) every visit Provide education on smoking Provide education on ulcer and skin care Treatment Activities: Topical wound management initiated : 03/05/2022 Notes: 04/30/22: Wound care regimen continues. Electronic Signature(s) Signed: 07/20/2022 1:50:52 PM By: Lorrin Jackson Entered By: Lorrin Jackson on 07/20/2022 08:02:20 -------------------------------------------------------------------------------- Pain Assessment  Details Patient Name: Date of Service: [name], [name]. 07/20/2022 8:00 [name] Medical Record Number: [id number] Patient Account Number: [id number] Date of Birth/Sex: Treating RN: [birth year] (61 y.o. [name] Primary Care Briyonna Omara: [name] Other Clinician: Referring Molly Savarino: Treating Tequlia Gonsalves/Extender: Elise Benne in Treatment: 56 Active Problems [name]  of Pain Severity and Description of Pain Patient Has Paino No Site Locations Shed, Temeca [name] ([id number]) [id number].pdf Page 7 of 11 Pain Management and Medication Current Pain Management: Electronic Signature(s) Signed: 07/20/2022 1:50:52 PM By: Lorrin Jackson Entered By: Lorrin Jackson on 07/20/2022 08:12:04 -------------------------------------------------------------------------------- Patient/Caregiver Education Details Patient Name: Date of Service: [name] 10/16/2023andnbsp8:00 [name] Medical Record Number: [id number] Patient Account Number: [id number] Date of Birth/Gender: Treating RN: [birth year] (61 y.o. [name] Primary Care Physician: [name] Other Clinician: Referring Physician: Treating Physician/Extender: Elise Benne in Treatment: 19 Education Assessment Education Provided To: Patient Education Topics Provided Smoking and Wound Healing: Methods: Explain/Verbal, Printed Responses: State content correctly Venous: Methods: Explain/Verbal, Printed Responses: State content correctly Wound/Skin Impairment: Methods: Explain/Verbal, Printed Responses: State content correctly Electronic Signature(s) Signed: 07/20/2022 1:50:52 PM By: Lorrin Jackson Entered By: Lorrin Jackson on 07/20/2022 08:02:48 [name], [name] ([id number]) [id number] Page 8 of 11 -------------------------------------------------------------------------------- Wound Assessment Details Patient  Name: Date of Service: [name]. 07/20/2022 8:00 [name] Medical Record Number: [id number] Patient Account Number: [id number] Date of Birth/Sex: Treating RN: 06/29/1961 (61 y.o. [name] Primary Care Keyondre Hepburn: [name] Other Clinician: Referring Carlester Kasparek: Treating Margreat Widener/Extender: Elise Benne in Treatment: 19 Wound Status Wound Number: 1 Primary Diabetic Wound/Ulcer of the Lower Extremity Etiology: Wound [name]: Left Calcaneus Wound Open Wounding Event: Other Lesion Status: Date Acquired: 10/06/2021 Comorbid Anemia, Chronic Obstructive Pulmonary Disease (COPD), Weeks Of Treatment: 19 History: Congestive Heart Failure, Hypertension, Myocardial Infarction, Clustered Wound: No Peripheral Venous Disease, Type II Diabetes, Neuropathy Photos Wound Measurements Length: (cm) 0 Width: (cm) 0 Depth: (cm) 0 Area: (cm) Volume: (cm) .9 % Reduction in Area: 90.4% .6 % Reduction in Volume: 95.2% .1 Epithelialization: Medium (34-66%) 0.424 Tunneling: No 0.042 Undermining: No Wound Description Classification: Grade 2 Wound Margin: Distinct, outline attached Exudate Amount: Medium Exudate Type: Serosanguineous Exudate Color: red, brown Foul Odor After Cleansing: No Slough/Fibrino Yes Wound Bed Granulation Amount: Large (67-100%) Exposed Structure Granulation Quality: Red, Pink Fascia Exposed: No Necrotic Amount: Small (1-33%) Fat Layer (Subcutaneous Tissue) Exposed: Yes Tendon Exposed: No Muscle Exposed: No Joint Exposed: No Bone Exposed: No Periwound Skin Texture Texture Color No Abnormalities Noted: No No Abnormalities Noted: Yes Callus: Yes Temperature / Pain Crepitus: No Temperature: No Abnormality Excoriation: No Induration: No Rash: No Scarring: No Moisture No Abnormalities Noted: No Dry / Scaly: No Maceration: Yes [name], [name] ([id number]) [id number] Page 9 of 11 Treatment  Notes Wound #1 (Calcaneus) Wound Laterality: Left Cleanser Soap and Water Discharge Instruction: May shower and wash wound with dial antibacterial soap and water prior to dressing change. Wound Cleanser Discharge Instruction: Cleanse the wound with wound cleanser prior to applying [name] clean dressing using gauze sponges, not tissue or cotton balls. Peri-Wound Care Triamcinolone 15 (g) Discharge Instruction: Use triamcinolone 15 (g) as directed Sween Lotion (Moisturizing lotion) Discharge Instruction: Apply moisturizing lotion as directed Topical Primary Dressing Epicord Discharge Instruction: applied by Ashlin Kreps secured with adaptic and steri-strips. Secondary Dressing ALLEVYN Heel 4 1/2in x 5 1/2in / 10.5cm x 13.5cm Discharge Instruction: Apply over primary dressing as directed. Woven Gauze Sponge, Non-Sterile 4x4 in Discharge Instruction: Apply over primary dressing as directed. Secured With Compression Wrap ThreePress (3 layer compression wrap) Discharge Instruction: Apply three layer compression as directed. Compression Stockings Add-Ons Electronic Signature(s) Signed: 07/20/2022 1:50:52 PM By: Lorrin Jackson Entered By: Lorrin Jackson on 07/20/2022 08:19:40 -------------------------------------------------------------------------------- Wound Assessment Details Patient Name: Date of Service: [name], [name]. 07/20/2022 8:00 [name] Medical Record Number: [id number] Patient Account Number: [id number] Date of Birth/Sex: Treating RN: [birth year] (61 y.o. [name] Primary Care Mlissa Tamayo: [name] Other Clinician: Referring Markeese Boyajian: Treating Kerron Sedano/Extender: Elise Benne in Treatment: 19 Wound Status Wound Number: 2 Primary Diabetic Wound/Ulcer of the Lower Extremity Etiology: Wound [name]: Left, Distal Calcaneus Wound Open Wounding Event: Blister Status: Date Acquired: 06/15/2022 Comorbid Anemia, Chronic Obstructive Pulmonary  Disease (COPD), Weeks Of Treatment: 3 History: Congestive Heart Failure, Hypertension, Myocardial Infarction, Clustered Wound: No Peripheral Venous Disease, Type II Diabetes, Neuropathy Photos [name], [name] ([id number]) [id number] Page 10 of 11 Wound Measurements Length: (cm) 1.8 Width: (cm) 2 Depth: (cm) 0.1 Area: (cm) 2.827 Volume: (cm) 0.283 % Reduction in Area: 80% % Reduction in Volume: 80% Epithelialization: Small (1-33%) Tunneling: No Undermining: No Wound Description Classification: Grade 2 Wound Margin: Distinct, outline attached Exudate Amount: Medium Exudate Type: Serosanguineous Exudate Color: red, brown Foul Odor After Cleansing: No Slough/Fibrino Yes Wound Bed Granulation Amount: Large (67-100%) Exposed Structure Granulation Quality: Red, Pink Fascia Exposed: No Necrotic Amount: Small (1-33%) Fat Layer (Subcutaneous Tissue) Exposed: Yes Necrotic Quality: Adherent Slough Tendon Exposed: No Muscle Exposed: No Joint Exposed: No Bone Exposed: No Periwound Skin Texture Texture Color No Abnormalities Noted: Yes No Abnormalities Noted: Yes Moisture Temperature / Pain No Abnormalities Noted: No Temperature: No Abnormality Dry / Scaly: No Maceration: Yes Treatment Notes Wound #2 (Calcaneus) Wound Laterality: Left, Distal Cleanser Soap and Water Discharge Instruction: May shower and wash wound with dial antibacterial soap and water prior to dressing change. Wound Cleanser Discharge Instruction: Cleanse the wound with wound cleanser prior to applying [name] clean dressing using gauze sponges, not tissue or cotton balls. Peri-Wound Care Triamcinolone 15 (g) Discharge Instruction: Use triamcinolone 15 (g) as directed Sween Lotion (Moisturizing lotion) Discharge Instruction: Apply moisturizing lotion as directed Topical Primary Dressing Epicord Discharge Instruction: placed by Makynna Manocchio. Secondary Dressing ALLEVYN Heel 4 1/2in x 5  1/2in / 10.5cm x 13.5cm Discharge Instruction: Apply over primary dressing as directed. Woven Gauze Sponge, Non-Sterile 4x4 in Discharge Instruction: Apply over primary dressing as directed. [name], [name] ([id number]) [id number] Page 11 of 11 Secured With Compression Wrap ThreePress (3 layer compression wrap) Discharge Instruction: Apply three layer compression as directed. Compression Stockings Add-Ons Electronic Signature(s) Signed: 07/20/2022 1:50:52 PM By: Lorrin Jackson Entered By: Lorrin Jackson on 07/20/2022 08:20:17 -------------------------------------------------------------------------------- Vitals Details Patient Name: Date of Service: [name], [name]. 07/20/2022 8:00 [name] Medical Record Number: [id number] Patient Account Number: [id number] Date of Birth/Sex: Treating RN: Feb 11, 1961 (61 y.o. [name] Primary Care Napolean Sia: [name] Other Clinician: Referring Germaine Ripp: Treating Suheyla Mortellaro/Extender: Elise Benne in Treatment: 19 Vital Signs Time Taken: 08:07 Temperature (F): 98 Height (in): 66 Pulse (bpm): 109 Weight (lbs): 339 Respiratory Rate (breaths/min): 18 Body Mass Index (BMI): 54.7 Blood Pressure (mmHg): 128/77 Capillary Blood Glucose (mg/dl): 280 Reference Range: 80 - 120 mg / dl Electronic Signature(s) Signed: 07/20/2022 1:50:52 PM By: Lorrin Jackson Entered By: Lorrin Jackson on 07/20/2022 08:08:20

## 2022-07-20 NOTE — Progress Notes (Signed)
Willmann, Dalyla [name] [id number] Page 1 of 12 Visit Report for 07/20/2022 Chief Complaint Document Details Patient Name: Date of Service: [name]. 07/20/2022 8:00 [name] Medical Record Number: [id number] Patient Account Number: [id number] Date of Birth/Sex: Treating RN: Oct 17, 1960 (61 y.o. F) Primary Care Provider: Jenean Lindau Other Clinician: Referring Provider: Treating Provider/Extender: Elise Benne in [id number] Information Obtained from: Patient Chief Complaint 03/05/2022; left foot wound Electronic Signature(s) Signed: 07/20/2022 9:25:17 AM By: Kalman Shan DO Entered By: Kalman Shan on 07/20/2022 09:13:09 -------------------------------------------------------------------------------- Cellular or Tissue Based Product Details Patient Name: Date of Service: [name], [name]. 07/20/2022 8:00 [name] Medical Record Number: [id number] Patient Account Number: [id number] Date of Birth/Sex: Treating RN: [birth year] (61 y.o. [name] Primary Care Provider: Jenean Lindau Other Clinician: Referring Provider: Treating Provider/Extender: Elise Benne in Treatment: 19 Cellular or Tissue Based Product Type Wound #1 Left Calcaneus Applied to: Performed By: Physician Kalman Shan, DO Cellular or Tissue Based Product Type: Epicord Level of Consciousness (Pre-procedure): Awake and Alert Pre-procedure Verification/Time Out Yes - 08:30 Taken: Location: genitalia / hands / feet / multiple digits Wound Size (sq cm): 0.54 Product Size (sq cm): 6 Waste Size (sq cm): 4.5 Waste Reason: wound size Amount of Product Applied (sq cm): 1.5 Instrument Used: Curette Lot #: [id number] Expiration Date: 03/06/2027 Reconstituted: Yes Solution Type: Normal Saline Solution Amount: 96ml Lot #: [id number] Solution Expiration Date: 02/01/2025 Secured: Yes Secured With:  Steri-Strips Dressing Applied: Yes Primary Dressing: Adaptic Response to Treatment: Procedure was tolerated well Level of Consciousness (Post- Awake and Alert procedure): Post Procedure Diagnosis [name], [name] ([id number] Page 2 of 12 Same as Pre-procedure Notes Procedure scribed for Dr. Heber [place] by Janan Halter, RN Electronic Signature(s) Signed: 07/20/2022 12:04:28 PM By: Lorrin Jackson Signed: 07/20/2022 9:25:17 AM By: Kalman Shan DO Entered By: Lorrin Jackson on 07/20/2022 12:04:28 -------------------------------------------------------------------------------- Debridement Details Patient Name: Date of Service: [name], [name]. 07/20/2022 8:00 [name] Medical Record Number: [id number] Patient Account Number: [id number] Date of Birth/Sex: Treating RN: 1961/09/05 (61 y.o. [name] Primary Care Provider: Jenean Lindau Other Clinician: Referring Provider: Treating Provider/Extender: Elise Benne in Treatment: 19 Debridement Performed for Assessment: Wound #1 Left Calcaneus Performed By: Physician Kalman Shan, DO Debridement Type: Debridement Severity of Tissue Pre Debridement: Fat layer exposed Level of Consciousness (Pre-procedure): Awake and Alert Pre-procedure Verification/Time Out Yes - 08:29 Taken: Start Time: 08:33 Pain Control: Lidocaine 4% T opical Solution T Area Debrided (L x W): otal 0.9 (cm) x 0.6 (cm) = 0.54 (cm) Tissue and other material debrided: Non-Viable, Slough, Subcutaneous, Slough Level: Skin/Subcutaneous Tissue Debridement Description: Excisional Instrument: Curette Bleeding: Minimum Hemostasis Achieved: Pressure End Time: 08:33 Response to Treatment: Procedure was tolerated well Level of Consciousness (Post- Awake and Alert procedure): Post Debridement Measurements of Total Wound Length: (cm) 0.9 Width: (cm) 0.6 Depth: (cm) 0.1 Volume: (cm)  0.042 Character of Wound/Ulcer Post Debridement: Stable Severity of Tissue Post Debridement: Fat layer exposed Post Procedure Diagnosis Same as Pre-procedure Notes Procedure scribed for Dr. Heber Avalon by Janan Halter, RN Electronic Signature(s) Signed: 07/20/2022 9:25:17 AM By: Kalman Shan DO Signed: 07/20/2022 1:50:52 PM By: Lorrin Jackson Entered By: Lorrin Jackson on 07/20/2022 08:39:59 Carreker, [name] ([id number] Page 3 of 12 -------------------------------------------------------------------------------- Debridement Details Patient Name: Date of Service: [name]. 07/20/2022 8:00 [name] Medical Record Number: [id number] Patient Account Number: [id number] Date of Birth/Sex: Treating RN: 11-17-1960 (  61 y.o. [name] Primary Care Provider: Jenean Lindau Other Clinician: Referring Provider: Treating Provider/Extender: Elise Benne in Treatment: 19 Debridement Performed for Assessment: Wound #2 Left,Distal Calcaneus Performed By: Physician Kalman Shan, DO Debridement Type: Debridement Severity of Tissue Pre Debridement: Fat layer exposed Level of Consciousness (Pre-procedure): Awake and Alert Pre-procedure Verification/Time Out Yes - 08:29 Taken: Start Time: 08:30 Pain Control: Lidocaine 4% T opical Solution T Area Debrided (L x W): otal 1.8 (cm) x 2 (cm) = 3.6 (cm) Tissue and other material debrided: Non-Viable, Slough, Subcutaneous, Slough Level: Skin/Subcutaneous Tissue Debridement Description: Excisional Instrument: Curette Bleeding: Minimum Hemostasis Achieved: Pressure End Time: 08:33 Response to Treatment: Procedure was tolerated well Level of Consciousness (Post- Awake and Alert procedure): Post Debridement Measurements of Total Wound Length: (cm) 1.8 Width: (cm) 2 Depth: (cm) 0.1 Volume: (cm) 0.283 Character of Wound/Ulcer Post Debridement: Stable Severity of Tissue  Post Debridement: Fat layer exposed Post Procedure Diagnosis Same as Pre-procedure Notes Procedure scribed for Dr. Heber [place]cott by Janan Halter, RN Electronic Signature(s) Signed: 07/20/2022 9:25:17 AM By: Kalman Shan DO Signed: 07/20/2022 1:50:52 PM By: Lorrin Jackson Entered By: Lorrin Jackson on 07/20/2022 08:40:12 -------------------------------------------------------------------------------- HPI Details Patient Name: Date of Service: [name], [name]. 07/20/2022 8:00 [name] Medical Record Number: [id number] Patient Account Number: [id number] Date of Birth/Sex: Treating RN: 1960/12/11 (61 y.o. F) Primary Care Provider: Jenean Lindau Other Clinician: Referring Provider: Treating Provider/Extender: Elise Benne in Treatment: 19 History of Present Illness HPI Description: Admission 03/05/2022 Ms. Hannah Bartold is [name] 61 year old female with [name] past medical history of uncontrolled insulin-dependent type 2 diabetes, tobacco user and chronic diastolic heart failure that presents to the clinic for [name] 36-month history of wound to her left heel. She states she had [name] left ankle fusion in September 2022. She states that she always had [name] wound after the surgery and it never healed. She has home health and they have been doing compression wraps along with silver alginate to the wound bed. She currently denies signs of infection. 6/8; this is [name] 61 year old woman with type 2 diabetes. She developed [name] wound on her left Achilles heel just above the tip of the heel in the setting of recurrent ankle surgeries in late 2022. I have seen some of these results from either the cast or the surgical boots that are put on after these operations. She thinks this Fuerte, Yaslyn [name] ([id number]) [id number] Page 4 of 12 may be the case. And [name] sense of pressure ulcer. In any case that she has been using Santyl Hydrofera Blue under compression. She is not wearing  any footwear at home as she cannot find anything to accommodate the wrap 6/22; patient presents for follow-up. She has home health that comes out once [name] week to help with dressing changes. She has no issues or complaints today. We have been using Hydrofera Blue under compression therapy. 7/6; patient presents for follow-up. She states that home health did not come out for the past 2 weeks. It is unclear why. We have been using Hydrofera Blue and Santyl under compression therapy. 7/13; patient presents for follow-up. She did not take the oral antibiotics prescribed at last clinic visit. We have been using Hydrofera Blue with gentamicin/mupirocin ointment under compression therapy. She has no issues or complaints today. She denies signs of infection. 7/20; patient presents for follow-up. We have been using Hydrofera Blue with antibiotic ointment under 3 layer compression. She has home health that change the  dressing once. They put collagen on the wound bed. She also reports falling yesterday and hitting her right foot. She has no pain to the area today. 7/27; patient presents for follow-up. We have been using Hydrofera Blue under 3 layer compression. She has home health that changes the dressing. She has no issues or complaints today. 8/3; patient presents for follow-up. We continues to use Hydrofera Blue under 3 layer compression. She has no issues or complaints today. She has been approved for Epicord at 100%. 8/11; patient presents for follow-up. We have been using Hydrofera Blue under 3 layer compression. She has been approved for Epicord and we have this today. She is in agreement with having this applied. 8/18; patient presents for follow-up. Epicord #1 was placed in standard fashion last clinic visit. She has no issues or complaints today. 9/18; Unfortunately patient has missed her last clinic appointments due to falling and breaking her right ankle. We have been following her for her left  ankle wound. She has also developed [name] large blister to the left heel over the past week that has ruptured and dried. She currently resides In [facility]. Wound9/25; patient presents for follow-up. We have been using Hydrofera Blue and Santyl to the original left ankle wound and Xeroform to the dried blistered. We have been wrapping her with compression therapy. She resides in [name] facility and they changed the wrap once this past week. She has no issues or complaints today. She denies signs of infection. 10/3; patient presents for follow-up. We have been using Xeroform to the heel wound and Epicort to the left ankle wound All under compression therapy. She has no issues or complaints today. 10/10; patient presents for follow-up. We have been using Epicort to the left ankle wound and Santyl and Hydrofera Blue to the heel wound. All under compression therapy. she has no issues or complaints today. 10/16; patient presents for follow-up. Epicort was placed in standard fashion to the superior wound and onto the heel and Santyl and Hydrofera Blue. She states that the wrap got wet during her shower and her facility was able to rewrap with Kerlix/Coban. Electronic Signature(s) Signed: 07/20/2022 9:25:17 AM By: Kalman Shan DO Entered By: Kalman Shan on 07/20/2022 09:16:00 -------------------------------------------------------------------------------- Physical Exam Details Patient Name: Date of Service: [name], [name]. 07/20/2022 8:00 [name] Medical Record Number: [id number] Patient Account Number: [id number] Date of Birth/Sex: Treating RN: [birth year] (61 y.o. F) Primary Care Provider: Jenean Lindau Other Clinician: Referring Provider: Treating Provider/Extender: Elise Benne in Treatment: 19 Constitutional respirations regular, non-labored and within target range for patient.. Cardiovascular 2+ dorsalis pedis/posterior tibialis  pulses. Psychiatric pleasant and cooperative. Notes Left foot: T the posterior ankle and heel there is open wounds with granulation tissue and nonviable tissue. No surrounding signs of infection. o Electronic Signature(s) Signed: 07/20/2022 9:25:17 AM By: Kalman Shan DO Entered By: Kalman Shan on 07/20/2022 09:16:52 [name], [name] ([id number] Page 5 of 12 -------------------------------------------------------------------------------- Physician Orders Details Patient Name: Date of Service: [name]. 07/20/2022 8:00 [name] Medical Record Number: [id number] Patient Account Number: [id number] Date of Birth/Sex: Treating RN: 20-Aug-1961 (61 y.o. [name] Primary Care Provider: Jenean Lindau Other Clinician: Referring Provider: Treating Provider/Extender: Elise Benne in Treatment: 19 Verbal / Phone Orders: No Diagnosis Coding ICD-10 Coding Code Description [phone] Non-pressure chronic ulcer of other part of left foot with fat layer exposed E11.621 Type 2 diabetes mellitus with foot ulcer E66.01 Morbid (  severe) obesity due to excess calories Follow-up Appointments ppointment in 1 week. - Dr. Heber Wilmette and Leveda Anna, Room 7 Return [name] Other: - *****FACILITY TO NOT CHANGE DRESSINGS. LEAVE IN PLACE TO LEFT LEG/HEEL WOUNDS.*** Anesthetic (In clinic) Topical Lidocaine 5% applied to wound bed Cellular or Tissue Based Products Cellular or Tissue Based Product Type: - Run IVR for EpiFix and Epicord=100% covered 05/07/22: Epicord ordered daptic or Mepitel. (DO NOT REMOVE). - Cellular or Tissue Based Product applied to wound bed, secured with steri-strips, cover with [name] Epicord #1 05/15/25 Epicord #2 05/22/22 Epicord #3 06/29/2022 Epicord #4 07/07/2022 Epifix #5 07/14/22 Epicord #6 07/20/22 Bathing/ Shower/ Hygiene May shower with protection but do not get wound dressing(s) wet. - May use cast protector bag  from [facility], [facility] or [employer] Edema Control - Lymphedema / SCD / Other Elevate legs to the level of the heart or above for 30 minutes daily and/or when sitting, [name] frequency of: Avoid standing for long periods of time. Off-Loading Prevalon Boot - use while in bed and chair. Additional Orders / Instructions Follow Nutritious Diet - Monitor/Control Blood Sugar Wound Treatment Wound #1 - Calcaneus Wound Laterality: Left Cleanser: Soap and Water 1 x Per Week/30 Days Discharge Instructions: May shower and wash wound with dial antibacterial soap and water prior to dressing change. Cleanser: Wound Cleanser 1 x Per Week/30 Days Discharge Instructions: Cleanse the wound with wound cleanser prior to applying [name] clean dressing using gauze sponges, not tissue or cotton balls. Peri-Wound Care: Triamcinolone 15 (g) 1 x Per Week/30 Days Discharge Instructions: Use triamcinolone 15 (g) as directed Peri-Wound Care: Sween Lotion (Moisturizing lotion) 1 x Per Week/30 Days Discharge Instructions: Apply moisturizing lotion as directed Prim Dressing: Epicord 1 x Per Week/30 Days ary Discharge Instructions: applied by provider secured with adaptic and steri-strips. Secondary Dressing: ALLEVYN Heel 4 1/2in x 5 1/2in / 10.5cm x 13.5cm 1 x Per Week/30 Days Discharge Instructions: Apply over primary dressing as directed. Secondary Dressing: Woven Gauze Sponge, Non-Sterile 4x4 in (Home Health) 1 x Per Week/30 Days Discharge Instructions: Apply over primary dressing as directed. [name], [name] [id number] Page 6 of 12 Compression Wrap: ThreePress (3 layer compression wrap) (Home Health) 1 x Per Week/30 Days Discharge Instructions: Apply three layer compression as directed. Wound #2 - Calcaneus Wound Laterality: Left, Distal Cleanser: Soap and Water 1 x Per Week/30 Days Discharge Instructions: May shower and wash wound with dial antibacterial soap and water prior to dressing  change. Cleanser: Wound Cleanser 1 x Per Week/30 Days Discharge Instructions: Cleanse the wound with wound cleanser prior to applying [name] clean dressing using gauze sponges, not tissue or cotton balls. Peri-Wound Care: Triamcinolone 15 (g) 1 x Per Week/30 Days Discharge Instructions: Use triamcinolone 15 (g) as directed Peri-Wound Care: Sween Lotion (Moisturizing lotion) 1 x Per Week/30 Days Discharge Instructions: Apply moisturizing lotion as directed Prim Dressing: Epicord 1 x Per Week/30 Days ary Discharge Instructions: placed by provider. Secondary Dressing: ALLEVYN Heel 4 1/2in x 5 1/2in / 10.5cm x 13.5cm 1 x Per Week/30 Days Discharge Instructions: Apply over primary dressing as directed. Secondary Dressing: Woven Gauze Sponge, Non-Sterile 4x4 in (Home Health) 1 x Per Week/30 Days Discharge Instructions: Apply over primary dressing as directed. Compression Wrap: ThreePress (3 layer compression wrap) (Home Health) 1 x Per Week/30 Days Discharge Instructions: Apply three layer compression as directed. Electronic Signature(s) Signed: 07/20/2022 9:25:17 AM By: Kalman Shan DO Entered By: Kalman Shan on 07/20/2022 09:17:02 -------------------------------------------------------------------------------- Problem List Details Patient Name: Date of  Service: [name] N, [name]. 07/20/2022 8:00 [name] Medical Record Number: [id number] Patient Account Number: [id number] Date of Birth/Sex: Treating RN: 1960/11/02 (61 y.o. [name] Primary Care Provider: Jenean Lindau Other Clinician: Referring Provider: Treating Provider/Extender: Elise Benne in Treatment: 19 Active Problems ICD-10 Encounter Code Description Active Date MDM Diagnosis [id number] Non-pressure chronic ulcer of other part of left foot with fat layer exposed 03/05/2022 No Yes E11.621 Type 2 diabetes mellitus with foot ulcer 03/05/2022 No Yes E66.01 Morbid (severe) obesity due to excess  calories 03/05/2022 No Yes Inactive Problems Resolved Problems Krejci, Grazia [name] ([id number]) [id number] Page 7 of 12 Electronic Signature(s) Signed: 07/20/2022 9:25:17 AM By: Kalman Shan DO Entered By: Kalman Shan on 07/20/2022 09:12:37 -------------------------------------------------------------------------------- Progress Note Details Patient Name: Date of Service: [name], [name]. 07/20/2022 8:00 [name] Medical Record Number: [id number] Patient Account Number: [id number] Date of Birth/Sex: Treating RN: 1961/03/10 (61 y.o. F) Primary Care Provider: Jenean Lindau Other Clinician: Referring Provider: Treating Provider/Extender: Elise Benne in Treatment: 19 Subjective Chief Complaint Information obtained from Patient 03/05/2022; left foot wound History of Present Illness (HPI) Admission 03/05/2022 Ms. Genever Duffin is [name] 61 year old female with [name] past medical history of uncontrolled insulin-dependent type 2 diabetes, tobacco user and chronic diastolic heart failure that presents to the clinic for [name] 80-month history of wound to her left heel. She states she had [name] left ankle fusion in September 2022. She states that she always had [name] wound after the surgery and it never healed. She has home health and they have been doing compression wraps along with silver alginate to the wound bed. She currently denies signs of infection. 6/8; this is [name] 61 year old woman with type 2 diabetes. She developed [name] wound on her left Achilles heel just above the tip of the heel in the setting of recurrent ankle surgeries in late 2022. I have seen some of these results from either the cast or the surgical boots that are put on after these operations. She thinks this may be the case. And [name] sense of pressure ulcer. In any case that she has been using Santyl Hydrofera Blue under compression. She is not wearing any footwear at home as she cannot find anything  to accommodate the wrap 6/22; patient presents for follow-up. She has home health that comes out once [name] week to help with dressing changes. She has no issues or complaints today. We have been using Hydrofera Blue under compression therapy. 7/6; patient presents for follow-up. She states that home health did not come out for the past 2 weeks. It is unclear why. We have been using Hydrofera Blue and Santyl under compression therapy. 7/13; patient presents for follow-up. She did not take the oral antibiotics prescribed at last clinic visit. We have been using Hydrofera Blue with gentamicin/mupirocin ointment under compression therapy. She has no issues or complaints today. She denies signs of infection. 7/20; patient presents for follow-up. We have been using Hydrofera Blue with antibiotic ointment under 3 layer compression. She has home health that change the dressing once. They put collagen on the wound bed. She also reports falling yesterday and hitting her right foot. She has no pain to the area today. 7/27; patient presents for follow-up. We have been using Hydrofera Blue under 3 layer compression. She has home health that changes the dressing. She has no issues or complaints today. 8/3; patient presents for follow-up. We continues to use Hydrofera Blue under 3 layer  compression. She has no issues or complaints today. She has been approved for Epicord at 100%. 8/11; patient presents for follow-up. We have been using Hydrofera Blue under 3 layer compression. She has been approved for Epicord and we have this today. She is in agreement with having this applied. 8/18; patient presents for follow-up. Epicord #1 was placed in standard fashion last clinic visit. She has no issues or complaints today. 9/18; Unfortunately patient has missed her last clinic appointments due to falling and breaking her right ankle. We have been following her for her left ankle wound. She has also developed [name] large blister  to the left heel over the past week that has ruptured and dried. She currently resides In [facility]. Wound9/25; patient presents for follow-up. We have been using Hydrofera Blue and Santyl to the original left ankle wound and Xeroform to the dried blistered. We have been wrapping her with compression therapy. She resides in [name] facility and they changed the wrap once this past week. She has no issues or complaints today. She denies signs of infection. 10/3; patient presents for follow-up. We have been using Xeroform to the heel wound and Epicort to the left ankle wound All under compression therapy. She has no issues or complaints today. 10/10; patient presents for follow-up. We have been using Epicort to the left ankle wound and Santyl and Hydrofera Blue to the heel wound. All under compression therapy. she has no issues or complaints today. 10/16; patient presents for follow-up. Epicort was placed in standard fashion to the superior wound and onto the heel and Santyl and Hydrofera Blue. She states that the wrap got wet during her shower and her facility was able to rewrap with Kerlix/Coban. Patient History Information obtained from Patient, Chart. Family History Cancer - Father,Mother, Diabetes - Mother,Maternal Grandparents,Paternal Grandparents, Heart Disease - Siblings,Maternal Grandparents, Hypertension - Siblings, Tuberculosis - Maternal Grandparents,Paternal Grandparents. [name], [name] ([id number]) [id number] Page 8 of 12 Social History Current every day smoker, Marital Status - Single, Alcohol Use - Never, Drug Use - No History, Caffeine Use - Daily. Medical History Hematologic/Lymphatic Patient has history of Anemia Respiratory Patient has history of Chronic Obstructive Pulmonary Disease (COPD) Cardiovascular Patient has history of Congestive Heart Failure - Weighs self daily, Hypertension, Myocardial Infarction, Peripheral Venous  Disease Endocrine Patient has history of Type II Diabetes Neurologic Patient has history of Neuropathy Medical [name] Surgical History Notes nd Gastrointestinal GERD, Peptic Ulcer Disease Musculoskeletal Broke left leg 3 years ago, 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 Vitals Time Taken: 8:07 AM, Height: 66 in, Weight: 339 lbs, BMI: 54.7, Temperature: 98 F, Pulse: 109 bpm, Respiratory Rate: 18 breaths/min, Blood Pressure: 128/77 mmHg, Capillary Blood Glucose: 280 mg/dl. Cardiovascular 2+ dorsalis pedis/posterior tibialis pulses. Psychiatric pleasant and cooperative. General Notes: Left foot: T the posterior ankle and heel there is open wounds with granulation tissue and nonviable tissue. No surrounding signs of infection. o Integumentary (Hair, Skin) Wound #1 status is Open. Original cause of wound was Other Lesion. The date acquired was: 10/06/2021. The wound has been in treatment 19 weeks. The wound is located on the Left Calcaneus. The wound measures 0.9cm length x 0.6cm width x 0.1cm depth; 0.424cm^2 area and 0.042cm^3 volume. There is Fat Layer (Subcutaneous Tissue) exposed. There is no tunneling or undermining noted. There is [name] medium amount of serosanguineous drainage noted. The wound margin is distinct with the outline attached to the wound base.  There is large (67-100%) red, pink granulation within the wound bed. There is [name] small (1-33%) amount of necrotic tissue within the wound bed. The periwound skin appearance had no abnormalities noted for color. The periwound skin appearance exhibited: Callus, Maceration. The periwound skin appearance did not exhibit: Crepitus, Excoriation, Induration, Rash, Scarring, Dry/Scaly. Periwound temperature was noted as No Abnormality. Wound #2 status is Open. Original cause of wound was Blister. The date acquired was: 06/15/2022. The  wound has been in treatment 3 weeks. The wound is located on the Left,Distal Calcaneus. The wound measures 1.8cm length x 2cm width x 0.1cm depth; 2.827cm^2 area and 0.283cm^3 volume. There is Fat Layer (Subcutaneous Tissue) exposed. There is no tunneling or undermining noted. There is [name] medium amount of serosanguineous drainage noted. The wound margin is distinct with the outline attached to the wound base. There is large (67-100%) red, pink granulation within the wound bed. There is [name] small (1-33%) amount of necrotic tissue within the wound bed including Adherent Slough. The periwound skin appearance had no abnormalities noted for texture. The periwound skin appearance had no abnormalities noted for color. The periwound skin appearance exhibited: Maceration. The periwound skin appearance did not exhibit: Dry/Scaly. Periwound temperature was noted as No Abnormality. Assessment Active Problems ICD-10 Non-pressure chronic ulcer of other part of left foot with fat layer exposed Type 2 diabetes mellitus with foot ulcer Morbid (severe) obesity due to excess calories Patient's wounds have shown improvement in size and appearance since last clinic visit. I debrided nonviable tissue. Epicord #6 was placed in standard fashion to the ankle wound. There was enough left over to put over the heel wound. She was again wrapped with 3 layer compression. Follow-up in 1 week. [name], [name] [id number] Page 9 of 12 Procedures Wound #1 Pre-procedure diagnosis of Wound #1 is [name] Diabetic Wound/Ulcer of the Lower Extremity located on the Left Calcaneus .Severity of Tissue Pre Debridement is: Fat layer exposed. There was [name] Excisional Skin/Subcutaneous Tissue Debridement with [name] total area of 0.54 sq cm performed by Kalman Shan, DO. With the following instrument(s): Curette to remove Non-Viable tissue/material. Material removed includes Subcutaneous Tissue and Slough and after  achieving pain control using Lidocaine 4% T opical Solution. No specimens were taken. [name] time out was conducted at 08:29, prior to the start of the procedure. [name] Minimum amount of bleeding was controlled with Pressure. The procedure was tolerated well. Post Debridement Measurements: 0.9cm length x 0.6cm width x 0.1cm depth; 0.042cm^3 volume. Character of Wound/Ulcer Post Debridement is stable. Severity of Tissue Post Debridement is: Fat layer exposed. Post procedure Diagnosis Wound #1: Same as Pre-Procedure General Notes: Procedure scribed for Dr. Heber [place] by Janan Halter, RN. Pre-procedure diagnosis of Wound #1 is [name] Diabetic Wound/Ulcer of the Lower Extremity located on the Left Calcaneus. [name] skin graft procedure using [name] bioengineered skin substitute/cellular or tissue based product was performed by Kalman Shan, DO with the following instrument(s): Curette. Epicord was applied and secured with Steri-Strips. 1 sq cm of product was utilized and 5 sq cm was wasted due to wound size. Post Application, Adaptic was applied. [name] Time Out was conducted at 08:30, prior to the start of the procedure. The procedure was tolerated well. Post procedure Diagnosis Wound #1: Same as Pre-Procedure General Notes: Procedure scribed for Dr. Heber [place] by Janan Halter, RN. Pre-procedure diagnosis of Wound #1 is [name] Diabetic Wound/Ulcer of the Lower Extremity located on the Left Calcaneus . There was [name] Three Layer Compression Therapy Procedure by  Lorrin Jackson, RN. Post procedure Diagnosis Wound #1: Same as Pre-Procedure Wound #2 Pre-procedure diagnosis of Wound #2 is [name] Diabetic Wound/Ulcer of the Lower Extremity located on the Left,Distal Calcaneus .Severity of Tissue Pre Debridement is: Fat layer exposed. There was [name] Excisional Skin/Subcutaneous Tissue Debridement with [name] total area of 3.6 sq cm performed by Kalman Shan, DO. With the following instrument(s): Curette to remove Non-Viable tissue/material. Material removed  includes Subcutaneous Tissue and Slough and after achieving pain control using Lidocaine 4% Topical Solution. No specimens were taken. [name] time out was conducted at 08:29, prior to the start of the procedure. [name] Minimum amount of bleeding was controlled with Pressure. The procedure was tolerated well. Post Debridement Measurements: 1.8cm length x 2cm width x 0.1cm depth; 0.283cm^3 volume. Character of Wound/Ulcer Post Debridement is stable. Severity of Tissue Post Debridement is: Fat layer exposed. Post procedure Diagnosis Wound #2: Same as Pre-Procedure General Notes: Procedure scribed for Dr. Heber [place] by Janan Halter, RN. Pre-procedure diagnosis of Wound #2 is [name] Diabetic Wound/Ulcer of the Lower Extremity located on the Left,Distal Calcaneus . There was [name] Three Layer Compression Therapy Procedure by Lorrin Jackson, RN. Post procedure Diagnosis Wound #2: Same as Pre-Procedure Plan Follow-up Appointments: Return Appointment in 1 week. - Dr. Heber [place] and Leveda Anna, Room 7 Other: - *****FACILITY TO NOT CHANGE DRESSINGS. LEAVE IN PLACE TO LEFT LEG/HEEL WOUNDS.*** Anesthetic: (In clinic) Topical Lidocaine 5% applied to wound bed Cellular or Tissue Based Products: Cellular or Tissue Based Product Type: - Run IVR for EpiFix and Epicord=100% covered 05/07/22: Epicord ordered Cellular or Tissue Based Product applied to wound bed, secured with steri-strips, cover with Adaptic or Mepitel. (DO NOT REMOVE). - Epicord #1 05/15/25 Epicord #2 05/22/22 Epicord #3 06/29/2022 Epicord #4 07/07/2022 Epifix #5 07/14/22 Epicord #6 07/20/22 Bathing/ Shower/ Hygiene: May shower with protection but do not get wound dressing(s) wet. - May use cast protector bag from [facility], [facility] or [employer] Edema Control - Lymphedema / SCD / Other: Elevate legs to the level of the heart or above for 30 minutes daily and/or when sitting, [name] frequency of: Avoid standing for long periods of time. Off-Loading: Prevalon Boot - use while in bed and  chair. Additional Orders / Instructions: Follow Nutritious Diet - Monitor/Control Blood Sugar WOUND #1: - Calcaneus Wound Laterality: Left Cleanser: Soap and Water 1 x Per Week/30 Days Discharge Instructions: May shower and wash wound with dial antibacterial soap and water prior to dressing change. Cleanser: Wound Cleanser 1 x Per Week/30 Days Discharge Instructions: Cleanse the wound with wound cleanser prior to applying [name] clean dressing using gauze sponges, not tissue or cotton balls. Peri-Wound Care: Triamcinolone 15 (g) 1 x Per Week/30 Days Discharge Instructions: Use triamcinolone 15 (g) as directed Peri-Wound Care: Sween Lotion (Moisturizing lotion) 1 x Per Week/30 Days Discharge Instructions: Apply moisturizing lotion as directed Prim Dressing: Epicord 1 x Per Week/30 Days ary Discharge Instructions: applied by provider secured with adaptic and steri-strips. Secondary Dressing: ALLEVYN Heel 4 1/2in x 5 1/2in / 10.5cm x 13.5cm 1 x Per Week/30 Days Discharge Instructions: Apply over primary dressing as directed. Secondary Dressing: Woven Gauze Sponge, Non-Sterile 4x4 in (Home Health) 1 x Per Week/30 Days Discharge Instructions: Apply over primary dressing as directed. Com pression Wrap: ThreePress (3 layer compression wrap) (Home Health) 1 x Per Week/30 Days Discharge Instructions: Apply three layer compression as directed. WOUND #2: - Calcaneus Wound Laterality: Left, Distal Cleanser: Soap and Water 1 x Per Week/30 Days Discharge Instructions: May shower and  wash wound with dial antibacterial soap and water prior to dressing change. Cleanser: Wound Cleanser 1 x Per Week/30 Days Discharge Instructions: Cleanse the wound with wound cleanser prior to applying [name] clean dressing using gauze sponges, not tissue or cotton balls. Peri-Wound Care: Triamcinolone 15 (g) 1 x Per Week/30 Days Discharge Instructions: Use triamcinolone 15 (g) as directed Peri-Wound Care: Sween Lotion (Moisturizing  lotion) 1 x Per Week/30 Days Spada, Kerrilynn [name] ([id number] Page 10 of 12 Discharge Instructions: Apply moisturizing lotion as directed Prim Dressing: Epicord 1 x Per Week/30 Days ary Discharge Instructions: placed by provider. Secondary Dressing: ALLEVYN Heel 4 1/2in x 5 1/2in / 10.5cm x 13.5cm 1 x Per Week/30 Days Discharge Instructions: Apply over primary dressing as directed. Secondary Dressing: Woven Gauze Sponge, Non-Sterile 4x4 in (Home Health) 1 x Per Week/30 Days Discharge Instructions: Apply over primary dressing as directed. Com pression Wrap: ThreePress (3 layer compression wrap) (Home Health) 1 x Per Week/30 Days Discharge Instructions: Apply three layer compression as directed. 1. In office sharp debridement 2. Epicord #6 placed in standard fashion 3. 3 layer compression 4. Follow-up in 1 week Electronic Signature(s) Signed: 07/20/2022 9:25:17 AM By: Kalman Shan DO Entered By: Kalman Shan on 07/20/2022 09:20:05 -------------------------------------------------------------------------------- HxROS Details Patient Name: Date of Service: [name], [name]. 07/20/2022 8:00 [name] Medical Record Number: [id number] Patient Account Number: [id number] Date of Birth/Sex: Treating RN: 12-25-1960 (61 y.o. F) Primary Care Provider: Jenean Lindau Other Clinician: Referring Provider: Treating Provider/Extender: Elise Benne in Treatment: 19 Information Obtained From Patient Chart Hematologic/Lymphatic Medical History: Positive for: Anemia Respiratory Medical History: Positive for: Chronic Obstructive Pulmonary Disease (COPD) Cardiovascular Medical History: Positive for: Congestive Heart Failure - Weighs self daily; Hypertension; Myocardial Infarction; Peripheral Venous Disease Gastrointestinal Medical History: Past Medical History Notes: GERD, Peptic Ulcer Disease Endocrine Medical  History: Positive for: Type II Diabetes Time with diabetes: 16 years Treated with: Insulin, Oral agents Blood sugar tested every day: Yes Tested : Twice daily Musculoskeletal Medical History: Past Medical History Notes: Broke left leg 3 years ago, 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, foot fused Neurologic [name], [name] ([id number] Page 11 of 12 Medical History: Positive for: Neuropathy Psychiatric Medical History: Past Medical History Notes: Depression Immunizations Pneumococcal Vaccine: Received Pneumococcal Vaccination: No Implantable Devices None Family and Social History Cancer: Yes - Father,Mother; Diabetes: Yes - Mother,Maternal Grandparents,Paternal Grandparents; Heart Disease: Yes - Siblings,Maternal Grandparents; Hypertension: Yes - Siblings; Tuberculosis: Yes - Maternal Grandparents,Paternal Grandparents; Current every day smoker; Marital Status - Single; Alcohol Use: Never; Drug Use: No History; Caffeine Use: Daily; Financial Concerns: No; Food, Clothing or Shelter Needs: No; Support System Lacking: No; Transportation Concerns: No Electronic Signature(s) Signed: 07/20/2022 9:25:17 AM By: Kalman Shan DO Entered By: Kalman Shan on 07/20/2022 09:16:13 -------------------------------------------------------------------------------- SuperBill Details Patient Name: Date of Service: [name]. 07/20/2022 Medical Record Number: [id number] Patient Account Number: [id number] Date of Birth/Sex: Treating RN: 1961/08/15 (61 y.o. [name] Primary Care Provider: Jenean Lindau Other Clinician: Referring Provider: Treating Provider/Extender: Elise Benne in Treatment: 19 Diagnosis Coding ICD-10 Codes Code Description [phone] Non-pressure chronic ulcer of other part of left foot with fat layer exposed E11.621 Type 2 diabetes mellitus with foot ulcer E66.01 Morbid (severe) obesity due to  excess calories Facility Procedures : CPT4 Code: [id number] Description: [id number] Epicord 2cm x 3cm - per sqcm ICD-10 Diagnosis Description L97.522 Non-pressure chronic ulcer of other part of left foot with fat layer exposed Modifier: Quantity: 6 :  CPT4 Code: [id number] 1 Description: [id number] - SKIN SUB GRAFT FACE/NK/HF/G ICD-10 Diagnosis Description L97.522 Non-pressure chronic ulcer of other part of left foot with fat layer exposed Modifier: Quantity: 1 : CPT4 Code: [id number] 1 Description: [id number] - DEB SUBQ TISSUE 20 SQ CM/< ICD-10 Diagnosis Description L97.522 Non-pressure chronic ulcer of other part of left foot with fat layer exposed Modifier: 59 Quantity: 1 Physician Procedures : CPT4 Code Description Modifier [id number] - WC PHYS SKIN SUB GRAFT FACE/NK/HF/G ICD-10 Diagnosis Description Gritton, Elynn [name] ([id number]) [id number] ICD-10 Diagnosis Description L97.522 Non-pressure chronic ulcer of other  part of left foot with fat layer exposed Quantity: 1 df Page 12 of 12 : [id number] - WC PHYS SUBQ TISS 20 SQ CM 59 1 ICD-10 Diagnosis Description L97.522 Non-pressure chronic ulcer of other part of left foot with fat layer exposed Quantity: Electronic Signature(s) Signed: 07/20/2022 9:25:17 AM By: Kalman Shan DO Entered By: Kalman Shan on 07/20/2022 09:20:26

## 2022-07-27 ENCOUNTER — Encounter (HOSPITAL_BASED_OUTPATIENT_CLINIC_OR_DEPARTMENT_OTHER): Payer: 59 | Admitting: Internal Medicine

## 2022-07-27 DIAGNOSIS — E11621 Type 2 diabetes mellitus with foot ulcer: Secondary | ICD-10-CM | POA: Diagnosis not present

## 2022-07-27 DIAGNOSIS — L97522 Non-pressure chronic ulcer of other part of left foot with fat layer exposed: Secondary | ICD-10-CM | POA: Diagnosis not present

## 2022-07-30 NOTE — Progress Notes (Signed)
Quade, [name] ([id number]) [id number] Page 1 of 12 Visit Report for 07/27/2022 Chief Complaint Document Details Patient Name: Date of Service: [name] C. 07/27/2022 8:00 A M Medical Record Number: [id number] Patient Account Number: [id number] Date of Birth/Sex: Treating RN: June 18, 1961 (61 y.o. F) Primary Care Provider: [name] Other Clinician: Referring Provider: Treating Provider/Extender: [name] in Treatment: 20 Information Obtained from: Patient Chief Complaint 03/05/2022; left foot wound Electronic Signature(s) Signed: 07/27/2022 11:26:43 AM By: Geralyn Corwin DO Entered By: Geralyn Corwin on 07/27/2022 09:09:51 -------------------------------------------------------------------------------- Cellular or Tissue Based Product Details Patient Name: Date of Service: [name], [name]. 07/27/2022 8:00 A M Medical Record Number: [id number] Patient Account Number: [id number] Date of Birth/Sex: Treating RN: 08/27/1961 (61 y.o. [name], [name] Primary Care Provider: [name] Other Clinician: Referring Provider: Treating Provider/Extender: [name] in Treatment: 20 Cellular or Tissue Based Product Type Wound #1 Left Calcaneus Applied to: Performed By: Physician Geralyn Corwin, DO Cellular or Tissue Based Product Type: Epicord Level of Consciousness (Pre-procedure): Awake and Alert Pre-procedure Verification/Time Out Yes - 08:50 Taken: Location: genitalia / hands / feet / multiple digits Wound Size (sq cm): 0.25 Product Size (sq cm): 6 Waste Size (sq cm): 5.4 Waste Reason: waste used for other wound Amount of Product Applied (sq cm): 0.6 Instrument Used: Forceps, Scissors Lot #: [id number] Expiration Date: 03/06/2027 Fenestrated: No Reconstituted: Yes Solution Type: normal saline Solution Amount: 3mL Lot #: [id number] Solution Expiration Date:  02/01/2025 Secured: Yes Secured With: Steri-Strips Dressing Applied: Yes Primary Dressing: Adaptic Procedural Pain: 0 Post Procedural Pain: 0 Response to Treatment: Procedure was tolerated well Level of Consciousness (Post- Awake and Alert Caul, Chinwe C ([id number]) [id number] Page 2 of 12 procedure): Post Procedure Diagnosis Same as Pre-procedure Electronic Signature(s) Signed: 07/27/2022 11:26:43 AM By: Geralyn Corwin DO Signed: 07/30/2022 4:44:37 PM By: [name] RN Entered By: [name] on 07/27/2022 08:51:23 -------------------------------------------------------------------------------- Cellular or Tissue Based Product Details Patient Name: Date of Service: [name], [name]. 07/27/2022 8:00 A M Medical Record Number: [id number] Patient Account Number: [id number] Date of Birth/Sex: Treating RN: 1960-10-18 (61 y.o. [name], [name] Primary Care Provider: [name] Other Clinician: Referring Provider: Treating Provider/Extender: [name] in Treatment: 20 Cellular or Tissue Based Product Type Wound #2 Left,Distal Calcaneus Applied to: Performed By: Physician Geralyn Corwin, DO Cellular or Tissue Based Product Type: Epicord Level of Consciousness (Pre-procedure): Awake and Alert Pre-procedure Verification/Time Out Yes - 08:50 Taken: Location: genitalia / hands / feet / multiple digits Wound Size (sq cm): 5.4 Product Size (sq cm): 5.4 Waste Size (sq cm): 0 Amount of Product Applied (sq cm): 5.4 Instrument Used: Forceps, Scissors Lot #: [id number] Expiration Date: 03/06/2027 Fenestrated: No Reconstituted: Yes Solution Type: normal saline Solution Amount: 3mL Lot #: [id number] Solution Expiration Date: 02/01/2025 Secured: Yes Secured With: Steri-Strips Dressing Applied: Yes Primary Dressing: Adaptic Procedural Pain: 0 Post Procedural Pain: 0 Response to Treatment: Procedure  was tolerated well Level of Consciousness (Post- Awake and Alert procedure): Post Procedure Diagnosis Same as Pre-procedure Notes used what was left of Epicord and placed on Left heel-distal wound by Dr. Mikey Bussing Electronic Signature(s) Signed: 07/27/2022 11:26:43 AM By: Geralyn Corwin DO Signed: 07/30/2022 4:44:37 PM By: [name] RN Entered By: [name] on 07/27/2022 09:35:32 Thorner, Amrit C ([id number]) [id number] Page 3 of 12 -------------------------------------------------------------------------------- Debridement Details Patient Name: Date of Service: [name] C. 07/27/2022 8:00 A M Medical Record Number: [id number]  Patient Account Number: [id number] Date of Birth/Sex: Treating RN: 31-May-1961 (61 y.o. [name], [name] Primary Care Provider: [name] Other Clinician: Referring Provider: Treating Provider/Extender: [name] in Treatment: 20 Debridement Performed for Assessment: Wound #1 Left Calcaneus Performed By: Physician Geralyn Corwin, DO Debridement Type: Debridement Severity of Tissue Pre Debridement: Fat layer exposed Level of Consciousness (Pre-procedure): Awake and Alert Pre-procedure Verification/Time Out Yes - 08:42 Taken: Start Time: 08:42 Pain Control: Lidocaine T Area Debrided (L x W): otal 0.5 (cm) x 0.5 (cm) = 0.25 (cm) Tissue and other material debrided: Viable, Non-Viable, Slough, Subcutaneous, Slough Level: Skin/Subcutaneous Tissue Debridement Description: Excisional Instrument: Curette Bleeding: Minimum Hemostasis Achieved: Pressure End Time: 08:43 Procedural Pain: 0 Post Procedural Pain: 0 Response to Treatment: Procedure was tolerated well Level of Consciousness (Post- Awake and Alert procedure): Post Debridement Measurements of Total Wound Length: (cm) 0.5 Width: (cm) 0.5 Depth: (cm) 0.1 Volume: (cm) 0.02 Character of Wound/Ulcer Post  Debridement: Improved Severity of Tissue Post Debridement: Fat layer exposed Post Procedure Diagnosis Same as Pre-procedure Electronic Signature(s) Signed: 07/27/2022 11:26:43 AM By: Geralyn Corwin DO Signed: 07/30/2022 4:44:37 PM By: [name] RN Entered By: [name] on 07/27/2022 08:43:54 -------------------------------------------------------------------------------- Debridement Details Patient Name: Date of Service: [name] C. 07/27/2022 8:00 A M Medical Record Number: [id number] Patient Account Number: [id number] Date of Birth/Sex: Treating RN: 1961/04/11 (61 y.o. [name], [name] Primary Care Provider: [name] Other Clinician: Referring Provider: Treating Provider/Extender: [name] in Treatment: 20 Debridement Performed for Assessment: Wound #2 Left,Distal Calcaneus Performed By: Physician Geralyn Corwin, DO Debridement Type: Debridement Severity of Tissue Pre Debridement: Fat layer exposed Level of Consciousness (Pre-procedure): Awake and Alert Pre-procedure Verification/Time Out Yes - 08:42 Aramburo, Quinita C ([id number]) [id number] Page 4 of 12 Yes - 08:42 Taken: Start Time: 08:42 Pain Control: Lidocaine T Area Debrided (L x W): otal 1.8 (cm) x 3 (cm) = 5.4 (cm) Tissue and other material debrided: Viable, Non-Viable, Slough, Subcutaneous, Slough Level: Skin/Subcutaneous Tissue Debridement Description: Excisional Instrument: Curette Bleeding: Minimum Hemostasis Achieved: Pressure End Time: 08:43 Procedural Pain: 0 Post Procedural Pain: 0 Response to Treatment: Procedure was tolerated well Level of Consciousness (Post- Awake and Alert procedure): Post Debridement Measurements of Total Wound Length: (cm) 1.8 Width: (cm) 3 Depth: (cm) 0.1 Volume: (cm) 0.424 Character of Wound/Ulcer Post Debridement: Improved Severity of Tissue Post Debridement: Fat layer  exposed Post Procedure Diagnosis Same as Pre-procedure Electronic Signature(s) Signed: 07/27/2022 11:26:43 AM By: Geralyn Corwin DO Signed: 07/30/2022 4:44:37 PM By: [name] RN Entered By: [name] on 07/27/2022 08:47:03 -------------------------------------------------------------------------------- HPI Details Patient Name: Date of Service: [name], [name]. 07/27/2022 8:00 A M Medical Record Number: [id number] Patient Account Number: [id number] Date of Birth/Sex: Treating RN: 05/17/1961 (61 y.o. F) Primary Care Provider: [name] Other Clinician: Referring Provider: Treating Provider/Extender: [name] in Treatment: 20 History of Present Illness HPI Description: Admission 03/05/2022 Ms. Dejanae Singleton is a 61 year old female with a past medical history of uncontrolled insulin-dependent type 2 diabetes, tobacco user and chronic diastolic heart failure that presents to the clinic for a 59-month history of wound to her left heel. She states she had a left ankle fusion in September 2022. She states that she always had a wound after the surgery and it never healed. She has home health and they have been doing compression wraps along with silver alginate to the wound bed. She currently denies signs of infection. 6/8; this is  a 61 year old woman with type 2 diabetes. She developed a wound on her left Achilles heel just above the tip of the heel in the setting of recurrent ankle surgeries in late 2022. I have seen some of these results from either the cast or the surgical boots that are put on after these operations. She thinks this may be the case. And a sense of pressure ulcer. In any case that she has been using Santyl Hydrofera Blue under compression. She is not wearing any footwear at home as she cannot find anything to accommodate the wrap 6/22; patient presents for follow-up. She has home health that comes out once a week to help  with dressing changes. She has no issues or complaints today. We have been using Hydrofera Blue under compression therapy. 7/6; patient presents for follow-up. She states that home health did not come out for the past 2 weeks. It is unclear why. We have been using Hydrofera Blue and Santyl under compression therapy. 7/13; patient presents for follow-up. She did not take the oral antibiotics prescribed at last clinic visit. We have been using Hydrofera Blue with gentamicin/mupirocin ointment under compression therapy. She has no issues or complaints today. She denies signs of infection. 7/20; patient presents for follow-up. We have been using Hydrofera Blue with antibiotic ointment under 3 layer compression. She has home health that change the dressing once. They put collagen on the wound bed. She also reports falling yesterday and hitting her right foot. She has no pain to the area today. 7/27; patient presents for follow-up. We have been using Hydrofera Blue under 3 layer compression. She has home health that changes the dressing. She has no issues or complaints today. Schlee, Jane C ([id number]) [id number] Page 5 of 12 8/3; patient presents for follow-up. We continues to use Hydrofera Blue under 3 layer compression. She has no issues or complaints today. She has been approved for Epicord at 100%. 8/11; patient presents for follow-up. We have been using Hydrofera Blue under 3 layer compression. She has been approved for Epicord and we have this today. She is in agreement with having this applied. 8/18; patient presents for follow-up. Epicord #1 was placed in standard fashion last clinic visit. She has no issues or complaints today. 9/18; Unfortunately patient has missed her last clinic appointments due to falling and breaking her right ankle. We have been following her for her left ankle wound. She has also developed a large blister to the left heel over the past week  that has ruptured and dried. She currently resides In [facility]. Wound9/25; patient presents for follow-up. We have been using Hydrofera Blue and Santyl to the original left ankle wound and Xeroform to the dried blistered. We have been wrapping her with compression therapy. She resides in a facility and they changed the wrap once this past week. She has no issues or complaints today. She denies signs of infection. 10/3; patient presents for follow-up. We have been using Xeroform to the heel wound and Epicort to the left ankle wound All under compression therapy. She has no issues or complaints today. 10/10; patient presents for follow-up. We have been using Epicort to the left ankle wound and Santyl and Hydrofera Blue to the heel wound. All under compression therapy. she has no issues or complaints today. 10/16; patient presents for follow-up. Epicort was placed in standard fashion to the superior wound and onto the heel and Santyl and Hydrofera Blue. She states that the wrap got wet during her  shower and her facility was able to rewrap with Kerlix/Coban. 10/23; patient presents for follow-up. Epicord was placed to the wound beds last clinic visit. We continue Kerlix/Coban. She has no issues or complaints today. Electronic Signature(s) Signed: 07/27/2022 11:26:43 AM By: Geralyn Corwin DO Entered By: Geralyn Corwin on 07/27/2022 09:10:40 -------------------------------------------------------------------------------- Physical Exam Details Patient Name: Date of Service: Sharol Harness MY C. 07/27/2022 8:00 A M Medical Record Number: [id number] Patient Account Number: [id number] Date of Birth/Sex: Treating RN: March 28, 1961 (61 y.o. F) Primary Care Provider: [name] Other Clinician: Referring Provider: Treating Provider/Extender: [name] in Treatment: 20 Constitutional respirations regular, non-labored and within target range for  patient.. Cardiovascular 2+ dorsalis pedis/posterior tibialis pulses. Psychiatric pleasant and cooperative. Notes Left foot: T the posterior ankle and heel there is open wounds with granulation tissue and nonviable tissue. No surrounding signs of infection. o Electronic Signature(s) Signed: 07/27/2022 11:26:43 AM By: Geralyn Corwin DO Entered By: Geralyn Corwin on 07/27/2022 09:11:35 -------------------------------------------------------------------------------- Physician Orders Details Patient Name: Date of Service: [name], [name]. 07/27/2022 8:00 A M Medical Record Number: [id number] Patient Account Number: [id number] Date of Birth/Sex: Treating RN: 02-20-1961 (61 y.o. [name], [name] Primary Care Provider: [name] Other Clinician: Referring Provider: Treating Provider/Extender: Jhonnie Garner Worthington, [place] C ([id number]) [phone].pdf Page 6 of 12 Weeks in Treatment: 20 Verbal / Phone Orders: No Diagnosis Coding ICD-10 Coding Code Description L97.522 Non-pressure chronic ulcer of other part of left foot with fat layer exposed E11.621 Type 2 diabetes mellitus with foot ulcer E66.01 Morbid (severe) obesity due to excess calories Follow-up Appointments ppointment in 1 week. - Dr. Mikey Bussing Return A Other: - *****FACILITY TO NOT CHANGE DRESSINGS. LEAVE IN PLACE TO LEFT LEG/HEEL WOUNDS.*** Anesthetic (In clinic) Topical Lidocaine 5% applied to wound bed Cellular or Tissue Based Products Cellular or Tissue Based Product Type: - Run IVR for EpiFix and Epicord=100% covered 05/07/22: Epicord ordered daptic or Mepitel. (DO NOT REMOVE). - Cellular or Tissue Based Product applied to wound bed, secured with steri-strips, cover with A Epicord #1 05/15/25 Epicord #2 05/22/22 Epicord #3 06/29/2022 Epicord #4 07/07/2022 Epifix #5 07/14/22 Epicord #6 07/20/22 Epicord # 7 07/27/22 ***Run for Grafix*** Bathing/ Shower/  Hygiene May shower with protection but do not get wound dressing(s) wet. - May use cast protector bag from [facility], [facility] or [employer] Edema Control - Lymphedema / SCD / Other Elevate legs to the level of the heart or above for 30 minutes daily and/or when sitting, a frequency of: Avoid standing for long periods of time. Off-Loading Prevalon Boot - use while in bed and chair. Additional Orders / Instructions Follow Nutritious Diet - Monitor/Control Blood Sugar Wound Treatment Wound #1 - Calcaneus Wound Laterality: Left Cleanser: Soap and Water 1 x Per Week/30 Days Discharge Instructions: May shower and wash wound with dial antibacterial soap and water prior to dressing change. Cleanser: Wound Cleanser 1 x Per Week/30 Days Discharge Instructions: Cleanse the wound with wound cleanser prior to applying a clean dressing using gauze sponges, not tissue or cotton balls. Peri-Wound Care: Triamcinolone 15 (g) 1 x Per Week/30 Days Discharge Instructions: Use triamcinolone 15 (g) as directed Peri-Wound Care: Sween Lotion (Moisturizing lotion) 1 x Per Week/30 Days Discharge Instructions: Apply moisturizing lotion as directed Prim Dressing: Epicord 1 x Per Week/30 Days ary Discharge Instructions: applied by provider secured with adaptic and steri-strips. Secondary Dressing: ALLEVYN Heel 4 1/2in x 5 1/2in / 10.5cm x 13.5cm 1 x Per Week/30 Days Discharge  Instructions: Apply over primary dressing as directed. Secondary Dressing: Woven Gauze Sponge, Non-Sterile 4x4 in (Home Health) 1 x Per Week/30 Days Discharge Instructions: Apply over primary dressing as directed. Compression Wrap: ThreePress (3 layer compression wrap) (Home Health) 1 x Per Week/30 Days Discharge Instructions: Apply three layer compression as directed. Wound #2 - Calcaneus Wound Laterality: Left, Distal Cleanser: Soap and Water 1 x Per Week/30 Days Discharge Instructions: May shower and wash wound with dial antibacterial soap and water  prior to dressing change. Cleanser: Wound Cleanser 1 x Per Week/30 Days Discharge Instructions: Cleanse the wound with wound cleanser prior to applying a clean dressing using gauze sponges, not tissue or cotton balls. Peri-Wound Care: Triamcinolone 15 (g) 1 x Per Week/30 Days Parchment, Bruce C ([id number]) [id number] Page 7 of 12 Discharge Instructions: Use triamcinolone 15 (g) as directed Peri-Wound Care: Sween Lotion (Moisturizing lotion) 1 x Per Week/30 Days Discharge Instructions: Apply moisturizing lotion as directed Prim Dressing: Epicord 1 x Per Week/30 Days ary Discharge Instructions: placed by provider. Secondary Dressing: ALLEVYN Heel 4 1/2in x 5 1/2in / 10.5cm x 13.5cm 1 x Per Week/30 Days Discharge Instructions: Apply over primary dressing as directed. Secondary Dressing: Woven Gauze Sponge, Non-Sterile 4x4 in (Home Health) 1 x Per Week/30 Days Discharge Instructions: Apply over primary dressing as directed. Compression Wrap: ThreePress (3 layer compression wrap) (Home Health) 1 x Per Week/30 Days Discharge Instructions: Apply three layer compression as directed. Electronic Signature(s) Signed: 07/27/2022 11:26:43 AM By: Geralyn CorwinHoffman, Marketta Valadez DO Signed: 07/30/2022 4:44:37 PM By: Fonnie MuBreedlove, [name] RN Entered By: Fonnie MuBreedlove, [name] on 07/27/2022 09:34:29 -------------------------------------------------------------------------------- Problem List Details Patient Name: Date of Service: [name] IslamHUFFMA N, [name]. 07/27/2022 8:00 A M Medical Record Number: [id number] Patient Account Number: [id number] Date of Birth/Sex: Treating RN: 1961/05/13 (61 y.o. Debara [name]) [name], [name] Primary Care Provider: Junious Dresserampbell, Stephen Other Clinician: Referring Provider: Treating Provider/Extender: Andree MoroHoffman, Vickey Boak Campbell, Stephen Weeks in Treatment: 20 Active Problems ICD-10 Encounter Code Description Active Date MDM Diagnosis 256 132 0132L97.522 Non-pressure chronic ulcer of other part of left  foot with fat layer exposed 03/05/2022 No Yes E11.621 Type 2 diabetes mellitus with foot ulcer 03/05/2022 No Yes E66.01 Morbid (severe) obesity due to excess calories 03/05/2022 No Yes Inactive Problems Resolved Problems Electronic Signature(s) Signed: 07/27/2022 11:26:43 AM By: Geralyn CorwinHoffman, Jordan Pardini DO Entered By: Geralyn CorwinHoffman, Terriah Reggio on 07/27/2022 09:09:38 Flegal, Merrillyn C ([id number]) [id number] Page 8 of 12 -------------------------------------------------------------------------------- Progress Note Details Patient Name: Date of Service: [name] BushyHUFFMA N, [name]. 07/27/2022 8:00 A M Medical Record Number: [id number] Patient Account Number: [id number] Date of Birth/Sex: Treating RN: 1961/05/13 (61 y.o. F) Primary Care Provider: Junious Dresserampbell, Stephen Other Clinician: Referring Provider: Treating Provider/Extender: Andree MoroHoffman, Armour Villanueva Campbell, Stephen Weeks in Treatment: 20 Subjective Chief Complaint Information obtained from Patient 03/05/2022; left foot wound History of Present Illness (HPI) Admission 03/05/2022 Ms. Keirstan Lou MinerHuffman is a [age] year old female with a past medical history of uncontrolled insulin-dependent type 2 diabetes, tobacco user and chronic diastolic heart failure that presents to the clinic for a 3558-month history of wound to her left heel. She states she had a left ankle fusion in September 2022. She states that she always had a wound after the surgery and it never healed. She has home health and they have been doing compression wraps along with silver alginate to the wound bed. She currently denies signs of infection. 6/8; this is a 61 year old woman with type 2 diabetes. She developed a wound on her left Achilles heel just above the tip of the heel in the setting  of recurrent ankle surgeries in late 2022. I have seen some of these results from either the cast or the surgical boots that are put on after these operations. She thinks this may be the case. And a sense of  pressure ulcer. In any case that she has been using Santyl Hydrofera Blue under compression. She is not wearing any footwear at home as she cannot find anything to accommodate the wrap 6/22; patient presents for follow-up. She has home health that comes out once a week to help with dressing changes. She has no issues or complaints today. We have been using Hydrofera Blue under compression therapy. 7/6; patient presents for follow-up. She states that home health did not come out for the past 2 weeks. It is unclear why. We have been using Hydrofera Blue and Santyl under compression therapy. 7/13; patient presents for follow-up. She did not take the oral antibiotics prescribed at last clinic visit. We have been using Hydrofera Blue with gentamicin/mupirocin ointment under compression therapy. She has no issues or complaints today. She denies signs of infection. 7/20; patient presents for follow-up. We have been using Hydrofera Blue with antibiotic ointment under 3 layer compression. She has home health that change the dressing once. They put collagen on the wound bed. She also reports falling yesterday and hitting her right foot. She has no pain to the area today. 7/27; patient presents for follow-up. We have been using Hydrofera Blue under 3 layer compression. She has home health that changes the dressing. She has no issues or complaints today. 8/3; patient presents for follow-up. We continues to use Hydrofera Blue under 3 layer compression. She has no issues or complaints today. She has been approved for Epicord at 100%. 8/11; patient presents for follow-up. We have been using Hydrofera Blue under 3 layer compression. She has been approved for Epicord and we have this today. She is in agreement with having this applied. 8/18; patient presents for follow-up. Epicord #1 was placed in standard fashion last clinic visit. She has no issues or complaints today. 9/18; Unfortunately patient has missed her  last clinic appointments due to falling and breaking her right ankle. We have been following her for her left ankle wound. She has also developed a large blister to the left heel over the past week that has ruptured and dried. She currently resides In [facility]. Wound9/25; patient presents for follow-up. We have been using Hydrofera Blue and Santyl to the original left ankle wound and Xeroform to the dried blistered. We have been wrapping her with compression therapy. She resides in a facility and they changed the wrap once this past week. She has no issues or complaints today. She denies signs of infection. 10/3; patient presents for follow-up. We have been using Xeroform to the heel wound and Epicort to the left ankle wound All under compression therapy. She has no issues or complaints today. 10/10; patient presents for follow-up. We have been using Epicort to the left ankle wound and Santyl and Hydrofera Blue to the heel wound. All under compression therapy. she has no issues or complaints today. 10/16; patient presents for follow-up. Epicort was placed in standard fashion to the superior wound and onto the heel and Santyl and Hydrofera Blue. She states that the wrap got wet during her shower and her facility was able to rewrap with Kerlix/Coban. 10/23; patient presents for follow-up. Epicord was placed to the wound beds last clinic visit. We continue Kerlix/Coban. She has no issues or complaints today.  Patient History Information obtained from Patient, Chart. Family History Cancer - Father,Mother, Diabetes - Mother,Maternal Grandparents,Paternal Grandparents, Heart Disease - Siblings,Maternal Grandparents, Hypertension - Siblings, Tuberculosis - Maternal Grandparents,Paternal Grandparents. Social History Current every day smoker, Marital Status - Single, Alcohol Use - Never, Drug Use - No History, Caffeine Use - Daily. 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 Vitals Time Taken: 8:12 AM, Height: 66 in, Weight: 339 lbs, BMI: 54.7, Temperature: 98.2 F, Pulse: 102 bpm, Respiratory Rate: 17 breaths/min, Blood Pressure: 123/77 mmHg, Capillary Blood Glucose: 180 mg/dl. Cardiovascular 2+ dorsalis pedis/posterior tibialis pulses. Psychiatric pleasant and cooperative. General Notes: Left foot: T the posterior ankle and heel there is open wounds with granulation tissue and nonviable tissue. No surrounding signs of infection. o Integumentary (Hair, Skin) Wound #1 status is Open. Original cause of wound was Other Lesion. The date acquired was: 10/06/2021. The wound has been in treatment 20 weeks. The wound is located on the Left Calcaneus. The wound measures 0.5cm length x 0.5cm width x 0.1cm depth; 0.196cm^2 area and 0.02cm^3 volume. There is Fat Layer (Subcutaneous Tissue) exposed. There is no tunneling or undermining noted. There is a medium amount of serosanguineous drainage noted. The wound margin is distinct with the outline attached to the wound base. There is large (67-100%) red, pink granulation within the wound bed. There is a small (1-33%) amount of necrotic tissue within the wound bed. The periwound skin appearance had no abnormalities noted for color. The periwound  skin appearance exhibited: Callus, Maceration. The periwound skin appearance did not exhibit: Crepitus, Excoriation, Induration, Rash, Scarring, Dry/Scaly. Periwound temperature was noted as No Abnormality. Wound #2 status is Open. Original cause of wound was Blister. The date acquired was: 06/15/2022. The wound has been in treatment 4 weeks. The wound is located on the Left,Distal Calcaneus. The wound measures 1.8cm length x 3cm width x 0.1cm depth; 4.241cm^2 area and 0.424cm^3 volume. There is Fat Layer (Subcutaneous Tissue) exposed. There is no tunneling or undermining noted. There is a medium amount of serosanguineous drainage noted. The wound margin is distinct with the outline attached to the wound base. There is large (67-100%) red, pink granulation within the wound bed. There is a small (1-33%) amount of necrotic tissue within the wound bed including Adherent Slough. The periwound skin appearance had no abnormalities noted for texture. The periwound skin appearance had no abnormalities noted for color. The periwound skin appearance exhibited: Maceration. The periwound skin appearance did not exhibit: Dry/Scaly. Periwound temperature was noted as No Abnormality. Assessment Active Problems ICD-10 Non-pressure chronic ulcer of other part of left foot with fat layer exposed Type 2 diabetes mellitus with foot ulcer Morbid (severe) obesity due to excess calories Patient's wounds appear well-healing. I debrided nonviable tissue. I placed epi cord in standard fashion today. Continue Kerlix/Coban. Follow-up in 1 week. Procedures Wound #1 Pre-procedure diagnosis of Wound #1 is a Diabetic Wound/Ulcer of the Lower Extremity located on the Left Calcaneus .Severity of Tissue Pre Debridement is: Fat  layer exposed. There was a Excisional Skin/Subcutaneous Tissue Debridement with a total area of 0.25 sq cm performed by Geralyn Corwin, DO. With Cerezo, Caliana C ([id number])  [id number] Page 10 of 12 the following instrument(s): Curette to remove Viable and Non-Viable tissue/material. Material removed includes Subcutaneous Tissue and Slough and after achieving pain control using Lidocaine. No specimens were taken. A time out was conducted at 08:42, prior to the start of the procedure. A Minimum amount of bleeding was controlled with Pressure. The procedure was tolerated well with a pain level of 0 throughout and a pain level of 0 following the procedure. Post Debridement Measurements: 0.5cm length x 0.5cm width x 0.1cm depth; 0.02cm^3 volume. Character of Wound/Ulcer Post Debridement is improved. Severity of Tissue Post Debridement is: Fat layer exposed. Post procedure Diagnosis Wound #1: Same as Pre-Procedure Pre-procedure diagnosis of Wound #1 is a Diabetic Wound/Ulcer of the Lower Extremity located on the Left Calcaneus. A skin graft procedure using a bioengineered skin substitute/cellular or tissue based product was performed by Geralyn Corwin, DO with the following instrument(s): Forceps and Scissors. Epicord was applied and secured with Steri-Strips. 0.6 sq cm of product was utilized and 5.4 sq cm was wasted due to waste used for other wound. Post Application, Adaptic was applied. A Time Out was conducted at 08:50, prior to the start of the procedure. The procedure was tolerated well with a pain level of 0 throughout and a pain level of 0 following the procedure. Post procedure Diagnosis Wound #1: Same as Pre-Procedure . Wound #2 Pre-procedure diagnosis of Wound #2 is a Diabetic Wound/Ulcer of the Lower Extremity located on the Left,Distal Calcaneus .Severity of Tissue Pre Debridement is: Fat layer exposed. There was a Excisional Skin/Subcutaneous Tissue Debridement with a total area of 5.4 sq cm performed by Geralyn Corwin, DO. With the following instrument(s): Curette to remove Viable and Non-Viable tissue/material. Material  removed includes Subcutaneous Tissue and Slough and after achieving pain control using Lidocaine. No specimens were taken. A time out was conducted at 08:42, prior to the start of the procedure. A Minimum amount of bleeding was controlled with Pressure. The procedure was tolerated well with a pain level of 0 throughout and a pain level of 0 following the procedure. Post Debridement Measurements: 1.8cm length x 3cm width x 0.1cm depth; 0.424cm^3 volume. Character of Wound/Ulcer Post Debridement is improved. Severity of Tissue Post Debridement is: Fat layer exposed. Post procedure Diagnosis Wound #2: Same as Pre-Procedure Plan 1. In office sharp debridement 2. Epi cord placed in standard fashion today. 3. Kerlix/Coban 4. Follow-up in 1 week Electronic Signature(s) Signed: 07/27/2022 11:26:43 AM By: Geralyn Corwin DO Entered By: Geralyn Corwin on 07/27/2022 09:28:01 -------------------------------------------------------------------------------- HxROS Details Patient Name: Date of Service: [name], [name]. 07/27/2022 8:00 A M Medical Record Number: [id number] Patient Account Number: [id number] Date of Birth/Sex: Treating RN: 1961/08/29 (61 y.o. F) Primary Care Provider: [name] Other Clinician: Referring Provider: Treating Provider/Extender: [name] in Treatment: 20 Information Obtained From Patient Chart Hematologic/Lymphatic Medical History: Positive for: Anemia Respiratory Medical History: Positive for: Chronic Obstructive Pulmonary Disease (COPD) Cardiovascular Medical History: Positive for: Congestive Heart Failure - Weighs self daily; Hypertension; Myocardial Infarction; Peripheral Venous Disease Gastrointestinal Medical History: Weesner, Zahara C ([id number]) [id number] Page 11 of 12 Past Medical History Notes: GERD, Peptic Ulcer Disease Endocrine Medical History: Positive for: Type II  Diabetes Time with diabetes: 16 years Treated with: Insulin, Oral agents Blood sugar tested every day: Yes Tested : Twice  daily Musculoskeletal Medical History: Past Medical History Notes: Broke left leg 3 years ago, 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, foot fused Neurologic Medical History: Positive for: Neuropathy Psychiatric Medical History: Past Medical History Notes: Depression Immunizations Pneumococcal Vaccine: Received Pneumococcal Vaccination: No Implantable Devices None Family and Social History Cancer: Yes - Father,Mother; Diabetes: Yes - Mother,Maternal Grandparents,Paternal Grandparents; Heart Disease: Yes - Siblings,Maternal Grandparents; Hypertension: Yes - Siblings; Tuberculosis: Yes - Maternal Grandparents,Paternal Grandparents; Current every day smoker; Marital Status - Single; Alcohol Use: Never; Drug Use: No History; Caffeine Use: Daily; Financial Concerns: No; Food, Clothing or Shelter Needs: No; Support System Lacking: No; Transportation Concerns: No Electronic Signature(s) Signed: 07/27/2022 11:26:43 AM By: Geralyn Corwin DO Entered By: Geralyn Corwin on 07/27/2022 09:10:46 -------------------------------------------------------------------------------- SuperBill Details Patient Name: Date of Service: [name] C. 07/27/2022 Medical Record Number: [id number] Patient Account Number: [id number] Date of Birth/Sex: Treating RN: 1961-09-03 (61 y.o. F) Primary Care Provider: [name] Other Clinician: Referring Provider: Treating Provider/Extender: [name] in Treatment: 20 Diagnosis Coding ICD-10 Codes Code Description [phone] Non-pressure chronic ulcer of other part of left foot with fat layer exposed E11.621 Type 2 diabetes mellitus with foot ulcer E66.01 Morbid (severe) obesity due to excess calories Facility Procedures : Randazzo, 6 CPT4 Code: Magdalena C ([id number]) [id number] Description: [phone] 87  Epicord 2cm x 3cm - per sqcm Modifier: [id number] Quantity: df Page 12 of 12 : 3 CPT4 Code: 6100150 152 IC L Description: 75 - SKIN SUB GRAFT FACE/NK/HF/G D-10 Diagnosis Description 97.522 Non-pressure chronic ulcer of other part of left foot with fat layer exposed Modifier: 1 Quantity: : 3 CPT4 Code: 6100012 110 IC L Description: 42 - DEB SUBQ TISSUE 20 SQ CM/< D-10 Diagnosis Description 97.522 Non-pressure chronic ulcer of other part of left foot with fat layer exposed Modifier: 1 Quantity: Physician Procedures : CPT4 Code Description Modifier [id number] - WC PHYS SKIN SUB GRAFT FACE/NK/HF/G ICD-10 Diagnosis Description L97.522 Non-pressure chronic ulcer of other part of left foot with fat layer exposed Quantity: 1 : [id number] - WC PHYS SUBQ TISS 20 SQ CM ICD-10 Diagnosis Description L97.522 Non-pressure chronic ulcer of other part of left foot with fat layer exposed Quantity: 1 Electronic Signature(s) Signed: 07/27/2022 11:26:43 AM By: Geralyn Corwin DO Signed: 07/30/2022 4:44:37 PM By: [name] RN Entered By: [name] on 07/27/2022 09:36:10

## 2022-07-30 NOTE — Progress Notes (Signed)
Kluver, Erienne [name] ([id number]) [id number] Page 1 of 9 Visit Report for 07/27/2022 Arrival Information Details Patient Name: Date of Service: [name]. 07/27/2022 8:00 [name] M Medical Record Number: [id number] Patient Account Number: [id number] Date of Birth/Sex: Treating RN: 04/02/1961 (61 y.o. [name], [name] Primary Care Ragan Duhon: Jenean Lindau Other Clinician: Referring Lateisha Thurlow: Treating Hilliary Jock/Extender: Elise Benne in Treatment: 20 Visit Information History Since Last Visit Added or deleted any medications: No Patient Arrived: Wheel Chair Any new allergies or adverse reactions: No Arrival Time: 08:11 Had [name] fall or experienced change in No Accompanied By: self activities of daily living that may affect Transfer Assistance: Manual risk of falls: Patient Identification Verified: Yes Signs or symptoms of abuse/neglect since last visito No Secondary Verification Process Completed: Yes Hospitalized since last visit: No Patient Requires Transmission-Based Precautions: No Implantable device outside of the clinic excluding No Patient Has Alerts: No cellular tissue based products placed in the center since last visit: Has Dressing in Place as Prescribed: Yes Pain Present Now: Yes Electronic Signature(s) Signed: 07/30/2022 4:44:37 PM By: Rhae Hammock RN Entered By: Rhae Hammock on 07/27/2022 08:12:17 -------------------------------------------------------------------------------- Encounter Discharge Information Details Patient Name: Date of Service: [name]. 07/27/2022 8:00 [name] M Medical Record Number: [id number] Patient Account Number: [id number] Date of Birth/Sex: Treating RN: 25-Jul-1961 (61 y.o. [name], [name] Primary Care Kenetra Hildenbrand: Jenean Lindau Other Clinician: Referring Hendry Speas: Treating Alyx Mcguirk/Extender: Elise Benne in Treatment: 20 Encounter  Discharge Information Items Post Procedure Vitals Discharge Condition: Stable Temperature (F): 98.7 Ambulatory Status: Wheelchair Pulse (bpm): 74 Discharge Destination: Home Respiratory Rate (breaths/min): 17 Transportation: Private Auto Blood Pressure (mmHg): 120/80 Accompanied By: carolyn transporter Schedule Follow-up Appointment: Yes Clinical Summary of Care: Patient Declined Electronic Signature(s) Signed: 07/30/2022 4:44:37 PM By: Rhae Hammock RN Entered By: Rhae Hammock on 07/27/2022 09:36:50 [name], [name] ([id number]) [id number] Page 2 of 9 -------------------------------------------------------------------------------- Lower Extremity Assessment Details Patient Name: Date of Service: [name]. 07/27/2022 8:00 [name] M Medical Record Number: [id number] Patient Account Number: [id number] Date of Birth/Sex: Treating RN: 04/20/1961 (61 y.o. [name], [name] Primary Care [name]: Jenean Lindau Other Clinician: Referring Monzerrat Wellen: Treating Starsha Morning/Extender: Elise Benne in Treatment: 20 Edema Assessment Assessed: Shirlyn Goltz: Yes] Patrice Paradise: No] Edema: [Left: Ye] [Right: s] Calf Left: Right: Point of Measurement: 28 cm From Medial Instep 49.6 cm Ankle Left: Right: Point of Measurement: 3 cm From Medial Instep 31.6 cm Vascular Assessment Pulses: Dorsalis Pedis Palpable: [Left:Yes] Posterior Tibial Palpable: [Left:Yes] Electronic Signature(s) Signed: 07/30/2022 4:44:37 PM By: Rhae Hammock RN Entered By: Rhae Hammock on 07/27/2022 08:12:47 -------------------------------------------------------------------------------- Multi Wound Chart Details Patient Name: Date of Service: [name]. 07/27/2022 8:00 [name] M Medical Record Number: [id number] Patient Account Number: [id number] Date of Birth/Sex: Treating RN: 11/01/1960 (61 y.o. F) Primary Care Jarryd Gratz: Jenean Lindau Other  Clinician: Referring Leonid Manus: Treating Paxton Binns/Extender: Elise Benne in Treatment: 20 Vital Signs Height(in): 66 Capillary Blood Glucose(mg/dl): 180 Weight(lbs): 339 Pulse(bpm): 102 Body Mass Index(BMI): 54.7 Blood Pressure(mmHg): 123/77 Temperature(F): 98.2 Respiratory Rate(breaths/min): 17 [1:Photos:] [N/[name]:N/[name] [id number] Page 3 of 9] Left Calcaneus Left, Distal Calcaneus N/[name] Wound Location: Other Lesion Blister N/[name] Wounding Event: Diabetic Wound/Ulcer of the Lower Diabetic Wound/Ulcer of the Lower N/[name] Primary Etiology: Extremity Extremity Anemia, Chronic Obstructive Anemia, Chronic Obstructive N/[name] Comorbid History: Pulmonary Disease (COPD), Pulmonary Disease (COPD), Congestive Heart Failure, Congestive Heart Failure, Hypertension, Myocardial Infarction, Hypertension, Myocardial Infarction, Peripheral Venous Disease, Type II Peripheral  Venous Disease, Type II Diabetes, Neuropathy Diabetes, Neuropathy 10/06/2021 06/15/2022 N/[name] Date Acquired: 40 4 N/[name] Weeks of Treatment: Open Open N/[name] Wound Status: No No N/[name] Wound Recurrence: 0.5x0.5x0.1 1.8x3x0.1 N/[name] Measurements L x W x D (cm) 0.196 4.241 N/[name] [name] (cm) : rea 0.02 0.424 N/[name] Volume (cm) : 95.50% 70.00% N/[name] % Reduction in [name] rea: 97.70% 70.00% N/[name] % Reduction in Volume: Grade 2 Grade 2 N/[name] Classification: Medium Medium N/[name] Exudate [name] mount: Serosanguineous Serosanguineous N/[name] Exudate Type: red, brown red, brown N/[name] Exudate Color: Distinct, outline attached Distinct, outline attached N/[name] Wound Margin: Large (67-100%) Large (67-100%) N/[name] Granulation [name] mount: Red, Pink Red, Pink N/[name] Granulation Quality: Small (1-33%) Small (1-33%) N/[name] Necrotic [name] mount: Fat Layer (Subcutaneous Tissue): Yes Fat Layer (Subcutaneous Tissue): Yes N/[name] Exposed Structures: Fascia: No Fascia: No Tendon: No Tendon: No Muscle: No Muscle: No Joint: No Joint: No Bone:  No Bone: No Medium (34-66%) Small (1-33%) N/[name] Epithelialization: Debridement - Excisional Debridement - Excisional N/[name] Debridement: Pre-procedure Verification/Time Out 08:42 08:42 N/[name] Taken: Lidocaine Lidocaine N/[name] Pain Control: Subcutaneous, Slough Subcutaneous, Slough N/[name] Tissue Debrided: Skin/Subcutaneous Tissue Skin/Subcutaneous Tissue N/[name] Level: 0.25 5.4 N/[name] Debridement [name] (sq cm): rea Curette Curette N/[name] Instrument: Minimum Minimum N/[name] Bleeding: Pressure Pressure N/[name] Hemostasis [name] chieved: 0 0 N/[name] Procedural Pain: 0 0 N/[name] Post Procedural Pain: Procedure was tolerated well Procedure was tolerated well N/[name] Debridement Treatment Response: 0.5x0.5x0.1 1.8x3x0.1 N/[name] Post Debridement Measurements L x W x D (cm) 0.02 0.424 N/[name] Post Debridement Volume: (cm) Callus: Yes No Abnormalities Noted N/[name] Periwound Skin Texture: Excoriation: No Induration: No Crepitus: No Rash: No Scarring: No Maceration: Yes Maceration: Yes N/[name] Periwound Skin Moisture: Dry/Scaly: No Dry/Scaly: No No Abnormalities Noted No Abnormalities Noted N/[name] Periwound Skin Color: No Abnormality No Abnormality N/[name] Temperature: Cellular or Tissue Based Product Debridement N/[name] Procedures Performed: Debridement Treatment Notes Electronic Signature(s) Signed: 07/27/2022 11:26:43 AM By: Kalman Shan DO Entered By: Kalman Shan on 07/27/2022 09:09:43 -------------------------------------------------------------------------------- Multi-Disciplinary Care Plan Details Patient Name: Date of Service: [name]. 07/27/2022 8:00 [name] M [name], [name] ([id number]) [id number] Page 4 of 9 Medical Record Number: [id number] Patient Account Number: [id number] Date of Birth/Sex: Treating RN: November 06, 1960 (61 y.o. [name], [name] Primary Care Higinio Grow: Jenean Lindau Other Clinician: Referring Keoshia Steinmetz: Treating Kohle Winner/Extender: Elise Benne  in Treatment: 20 Active Inactive Abuse / Safety / Falls / Self Care Management Nursing Diagnoses: History of Falls Goals: Patient will not experience any injury related to falls Date Initiated: 03/05/2022 Target Resolution Date: 07/31/2022 Goal Status: Active Interventions: Assess fall risk on admission and as needed Assess: immobility, friction, shearing, incontinence upon admission and as needed Assess impairment of mobility on admission and as needed per policy Notes: 8/75/79: Fall prevention ongoing, recent fall. Wound/Skin Impairment Nursing Diagnoses: Impaired tissue integrity Goals: Patient/caregiver will verbalize understanding of skin care regimen Date Initiated: 03/05/2022 Target Resolution Date: 08/28/2022 Goal Status: Active Ulcer/skin breakdown will have [name] volume reduction of 30% by week 4 Date Initiated: 03/05/2022 Date Inactivated: 04/30/2022 Target Resolution Date: 05/02/2022 Goal Status: Met Ulcer/skin breakdown will have [name] volume reduction of 50% by week 8 Date Initiated: 04/30/2022 Date Inactivated: 07/07/2022 Target Resolution Date: 07/04/2022 Unmet Reason: patient sent three Goal Status: Unmet weeks inpatient and rehab before returning to wound center. Interventions: Assess patient/caregiver ability to obtain necessary supplies Assess patient/caregiver ability to perform ulcer/skin care regimen upon admission and as needed Assess ulceration(s) every visit Provide education on smoking Provide education on ulcer and  skin care Treatment Activities: Topical wound management initiated : 03/05/2022 Notes: 04/30/22: Wound care regimen continues. Electronic Signature(s) Signed: 07/27/2022 5:18:37 PM By: Deon Pilling RN, BSN Entered By: Deon Pilling on 07/27/2022 09:06:54 -------------------------------------------------------------------------------- Pain Assessment Details Patient Name: Date of Service: [name], [name]. 07/27/2022 8:00 [name] M Medical Record  Number: [id number] Patient Account Number: [id number] Date of Birth/Sex: Treating RN: 03-28-1961 (61 y.o. [name], [name] Primary Care Charlis Harner: Jenean Lindau Other Clinician: Burney Gauze, Momo [name] ([id number]) [id number] Page 5 of 9 Referring [place] Hanlon: Treating Rodert Hinch/Extender: Elise Benne in Treatment: 20 Active Problems Location of Pain Severity and Description of Pain Patient Has Paino No Site Locations Pain Management and Medication Current Pain Management: Electronic Signature(s) Signed: 07/30/2022 4:44:37 PM By: Rhae Hammock RN Entered By: Rhae Hammock on 07/27/2022 08:12:39 -------------------------------------------------------------------------------- Patient/Caregiver Education Details Patient Name: Date of Service: [name] 10/23/2023andnbsp8:00 [name] M Medical Record Number: [id number] Patient Account Number: [id number] Date of Birth/Gender: Treating RN: March 17, 1961 (61 y.o. [name] Primary Care Physician: Jenean Lindau Other Clinician: Referring Physician: Treating Physician/Extender: Elise Benne in Treatment: 20 Education Assessment Education Provided To: Patient Education Topics Provided Wound/Skin Impairment: Handouts: Skin Care Do's and Dont's Methods: Explain/Verbal Responses: Reinforcements needed Electronic Signature(s) Signed: 07/27/2022 5:18:37 PM By: Deon Pilling RN, BSN Entered By: Deon Pilling on 07/27/2022 09:07:04 [name], [name] ([id number]) [id number] Page 6 of 9 -------------------------------------------------------------------------------- Wound Assessment Details Patient Name: Date of Service: [name]. 07/27/2022 8:00 [name] M Medical Record Number: [id number] Patient Account Number: [id number] Date of Birth/Sex: Treating RN: 02/13/1961 (61 y.o. [name], [name] Primary Care Olney Monier:  Jenean Lindau Other Clinician: Referring Nallely Yost: Treating Ellean Firman/Extender: Elise Benne in Treatment: 20 Wound Status Wound Number: 1 Primary Diabetic Wound/Ulcer of the Lower Extremity Etiology: Wound Location: Left Calcaneus Wound Open Wounding Event: Other Lesion Status: Date Acquired: 10/06/2021 Comorbid Anemia, Chronic Obstructive Pulmonary Disease (COPD), Weeks Of Treatment: 20 History: Congestive Heart Failure, Hypertension, Myocardial Infarction, Clustered Wound: No Peripheral Venous Disease, Type II Diabetes, Neuropathy Photos Wound Measurements Length: (cm) 0 Width: (cm) 0 Depth: (cm) 0 Area: (cm) Volume: (cm) .5 % Reduction in Area: 95.5% .5 % Reduction in Volume: 97.7% .1 Epithelialization: Medium (34-66%) 0.196 Tunneling: No 0.02 Undermining: No Wound Description Classification: Grade 2 Wound Margin: Distinct, outline attached Exudate Amount: Medium Exudate Type: Serosanguineous Exudate Color: red, brown Foul Odor After Cleansing: No Slough/Fibrino Yes Wound Bed Granulation Amount: Large (67-100%) Exposed Structure Granulation Quality: Red, Pink Fascia Exposed: No Necrotic Amount: Small (1-33%) Fat Layer (Subcutaneous Tissue) Exposed: Yes Tendon Exposed: No Muscle Exposed: No Joint Exposed: No Bone Exposed: No Periwound Skin Texture Texture Color No Abnormalities Noted: No No Abnormalities Noted: Yes Callus: Yes Temperature / Pain Crepitus: No Temperature: No Abnormality Excoriation: No Induration: No Rash: No Scarring: No Moisture No Abnormalities Noted: No Dry / Scaly: No Maceration: Yes [name], [name] ([id number]) [id number] Page 7 of 9 Treatment Notes Wound #1 (Calcaneus) Wound Laterality: Left Cleanser Soap and Water Discharge Instruction: May shower and wash wound with dial antibacterial soap and water prior to dressing change. Wound Cleanser Discharge Instruction:  Cleanse the wound with wound cleanser prior to applying [name] clean dressing using gauze sponges, not tissue or cotton balls. Peri-Wound Care Triamcinolone 15 (g) Discharge Instruction: Use triamcinolone 15 (g) as directed Sween Lotion (Moisturizing lotion) Discharge Instruction: Apply moisturizing lotion as directed Topical Primary Dressing Epicord Discharge Instruction: applied by Leelynn Whetsel secured with adaptic and  steri-strips. Secondary Dressing ALLEVYN Heel 4 1/2in x 5 1/2in / 10.5cm x 13.5cm Discharge Instruction: Apply over primary dressing as directed. Woven Gauze Sponge, Non-Sterile 4x4 in Discharge Instruction: Apply over primary dressing as directed. Secured With Compression Wrap ThreePress (3 layer compression wrap) Discharge Instruction: Apply three layer compression as directed. Compression Stockings Add-Ons Electronic Signature(s) Signed: 07/27/2022 5:18:37 PM By: Deon Pilling RN, BSN Signed: 07/30/2022 4:44:37 PM By: Rhae Hammock RN Entered By: Deon Pilling on 07/27/2022 08:24:36 -------------------------------------------------------------------------------- Wound Assessment Details Patient Name: Date of Service: [name]. 07/27/2022 8:00 [name] M Medical Record Number: [id number] Patient Account Number: [id number] Date of Birth/Sex: Treating RN: July 17, 1961 (61 y.o. [name], [name] Primary Care Dajanay Northrup: Jenean Lindau Other Clinician: Referring Delailah Spieth: Treating Jannine Abreu/Extender: Elise Benne in Treatment: 20 Wound Status Wound Number: 2 Primary Diabetic Wound/Ulcer of the Lower Extremity Etiology: Wound Location: Left, Distal Calcaneus Wound Open Wounding Event: Blister Status: Date Acquired: 06/15/2022 Comorbid Anemia, Chronic Obstructive Pulmonary Disease (COPD), Weeks Of Treatment: 4 History: Congestive Heart Failure, Hypertension, Myocardial Infarction, Clustered Wound: No Peripheral Venous Disease, Type  II Diabetes, Neuropathy Photos [name], [name] ([id number]) [id number] Page 8 of 9 Wound Measurements Length: (cm) 1.8 Width: (cm) 3 Depth: (cm) 0.1 Area: (cm) 4.241 Volume: (cm) 0.424 % Reduction in Area: 70% % Reduction in Volume: 70% Epithelialization: Small (1-33%) Tunneling: No Undermining: No Wound Description Classification: Grade 2 Wound Margin: Distinct, outline attached Exudate Amount: Medium Exudate Type: Serosanguineous Exudate Color: red, brown Foul Odor After Cleansing: No Slough/Fibrino Yes Wound Bed Granulation Amount: Large (67-100%) Exposed Structure Granulation Quality: Red, Pink Fascia Exposed: No Necrotic Amount: Small (1-33%) Fat Layer (Subcutaneous Tissue) Exposed: Yes Necrotic Quality: Adherent Slough Tendon Exposed: No Muscle Exposed: No Joint Exposed: No Bone Exposed: No Periwound Skin Texture Texture Color No Abnormalities Noted: Yes No Abnormalities Noted: Yes Moisture Temperature / Pain No Abnormalities Noted: No Temperature: No Abnormality Dry / Scaly: No Maceration: Yes Treatment Notes Wound #2 (Calcaneus) Wound Laterality: Left, Distal Cleanser Soap and Water Discharge Instruction: May shower and wash wound with dial antibacterial soap and water prior to dressing change. Wound Cleanser Discharge Instruction: Cleanse the wound with wound cleanser prior to applying [name] clean dressing using gauze sponges, not tissue or cotton balls. Peri-Wound Care Triamcinolone 15 (g) Discharge Instruction: Use triamcinolone 15 (g) as directed Sween Lotion (Moisturizing lotion) Discharge Instruction: Apply moisturizing lotion as directed Topical Primary Dressing Epicord Discharge Instruction: placed by Darleen Moffitt. Secondary Dressing ALLEVYN Heel 4 1/2in x 5 1/2in / 10.5cm x 13.5cm Discharge Instruction: Apply over primary dressing as directed. Woven Gauze Sponge, Non-Sterile 4x4 in Discharge Instruction: Apply over  primary dressing as directed. [name], [name] ([id number]) [id number] Page 9 of 9 Secured With Compression Wrap ThreePress (3 layer compression wrap) Discharge Instruction: Apply three layer compression as directed. Compression Stockings Add-Ons Electronic Signature(s) Signed: 07/27/2022 5:18:37 PM By: Deon Pilling RN, BSN Signed: 07/30/2022 4:44:37 PM By: Rhae Hammock RN Entered By: Deon Pilling on 07/27/2022 08:24:18 -------------------------------------------------------------------------------- Vitals Details Patient Name: Date of Service: [name], [name]. 07/27/2022 8:00 [name] M Medical Record Number: [id number] Patient Account Number: [id number] Date of Birth/Sex: Treating RN: 1961/09/23 (61 y.o. [name], [name] Primary Care Lacee Grey: Jenean Lindau Other Clinician: Referring Madine Sarr: Treating Jamillah Camilo/Extender: Elise Benne in Treatment: 20 Vital Signs Time Taken: 08:12 Temperature (F): 98.2 Height (in): 66 Pulse (bpm): 102 Weight (lbs): 339 Respiratory Rate (breaths/min): 17 Body Mass Index (BMI): 54.7 Blood Pressure (mmHg): 123/77 Capillary  Blood Glucose (mg/dl): 180 Reference Range: 80 - 120 mg / dl Electronic Signature(s) Signed: 07/30/2022 4:44:37 PM By: Rhae Hammock RN Entered By: Rhae Hammock on 07/27/2022 08:12:32

## 2022-08-04 ENCOUNTER — Encounter (HOSPITAL_BASED_OUTPATIENT_CLINIC_OR_DEPARTMENT_OTHER): Payer: 59 | Admitting: Internal Medicine

## 2022-08-04 DIAGNOSIS — L97522 Non-pressure chronic ulcer of other part of left foot with fat layer exposed: Secondary | ICD-10-CM

## 2022-08-04 DIAGNOSIS — E11621 Type 2 diabetes mellitus with foot ulcer: Secondary | ICD-10-CM | POA: Diagnosis not present

## 2022-08-07 NOTE — Progress Notes (Signed)
Landress, Shabre [name] ([id number]) [id number] Page 1 of 12 Visit Report for 08/04/2022 Chief Complaint Document Details Patient Name: Date of Service: [name]. 08/04/2022 8:00 [name] Medical Record Number: [id number] Patient Account Number: [id number] Date of Birth/Sex: Treating RN: [birth year] (61 y.o. F) Primary Care Provider: [name] Other Clinician: Referring Provider: Treating Provider/Extender: [name] in Treatment: 21 Information Obtained from: Patient Chief Complaint 03/05/2022; left foot wound Electronic Signature(s) Signed: 08/07/2022 1:40:10 PM By: Geralyn Corwin DO Entered By: Geralyn Corwin on 08/04/2022 09:34:25 -------------------------------------------------------------------------------- Cellular or Tissue Based Product Details Patient Name: Date of Service: [name] [name], [name]. 08/04/2022 8:00 [name] Medical Record Number: [id number] Patient Account Number: [id number] Date of Birth/Sex: Treating RN: 01/02/1961 (61 y.o. [name], [name] Primary Care Provider: [name] Other Clinician: Referring Provider: Treating Provider/Extender: [name] in Treatment: 21 Cellular or Tissue Based Product Type Wound #1 Left Calcaneus Applied to: Performed By: Physician Geralyn Corwin, DO Cellular or Tissue Based Product Type: Epicord Level of Consciousness (Pre-procedure): Awake and Alert Pre-procedure Verification/Time Out Yes - 08:45 Taken: Location: genitalia / hands / feet / multiple digits Wound Size (sq cm): 0.09 Product Size (sq cm): 2 Waste Size (sq cm): 1 Waste Reason: wound size Amount of Product Applied (sq cm): 1 Instrument Used: Forceps, Scissors Lot #: [id number] Expiration Date: 04/05/2027 Fenestrated: No Reconstituted: Yes Solution Type: normal saline Solution Amount: 3mL Lot #: [id number] Solution Expiration Date: 02/01/2025 Secured:  Yes Secured With: Steri-Strips, adaptic Dressing Applied: No Procedural Pain: 0 Post Procedural Pain: 0 Response to Treatment: Procedure was tolerated well Level of Consciousness (Post- Awake and Alert procedure): [name] ([id number]) [id number] Page 2 of 12 Post Procedure Diagnosis Same as Pre-procedure Electronic Signature(s) Signed: 08/05/2022 10:46:15 AM By: Shawn Stall RN, BSN Signed: 08/07/2022 1:40:10 PM By: Geralyn Corwin DO Previous Signature: 08/04/2022 9:55:18 AM Version By: Shawn Stall RN, BSN Entered By: Shawn Stall on 08/04/2022 11:02:16 -------------------------------------------------------------------------------- Cellular or Tissue Based Product Details Patient Name: Date of Service: [name], [name]. 08/04/2022 8:00 [name] Medical Record Number: [id number] Patient Account Number: [id number] Date of Birth/Sex: Treating RN: June 11, 1961 (61 y.o. [name], [name] Primary Care Provider: [name] Other Clinician: Referring Provider: Treating Provider/Extender: [name] in Treatment: 21 Cellular or Tissue Based Product Type Wound #2 Left,Distal Calcaneus Applied to: Performed By: Physician Geralyn Corwin, DO Cellular or Tissue Based Product Type: Epicord Level of Consciousness (Pre-procedure): Awake and Alert Pre-procedure Verification/Time Out Yes - 08:45 Taken: Location: genitalia / hands / feet / multiple digits Wound Size (sq cm): 8.8 Product Size (sq cm): 4 Waste Size (sq cm): 0 Amount of Product Applied (sq cm): 4 Instrument Used: Forceps, Scissors Lot #: [id number] Expiration Date: 04/05/2027 Fenestrated: No Reconstituted: Yes Solution Type: normal saline Solution Amount: 3mL Lot #: [id number] Solution Expiration Date: 02/01/2025 Secured: Yes Secured With: Steri-Strips, adaptic Dressing Applied: No Procedural Pain: 0 Post Procedural Pain: 0 Response to Treatment:  Procedure was tolerated well Level of Consciousness (Post- Awake and Alert procedure): Post Procedure Diagnosis Same as Pre-procedure Electronic Signature(s) Signed: 08/05/2022 10:46:15 AM By: Shawn Stall RN, BSN Signed: 08/07/2022 1:40:10 PM By: Geralyn Corwin DO Previous Signature: 08/04/2022 9:55:18 AM Version By: Shawn Stall RN, BSN Entered By: Shawn Stall on 08/04/2022 11:02:37 Debridement Details -------------------------------------------------------------------------------- [name], [name] ([id number]) [id number] Page 3 of 12 Patient Name: Date of Service: [name]. 08/04/2022 8:00 [name] Medical Record Number:  [id number] Patient Account Number: [id number] Date of Birth/Sex: Treating RN: 1961/06/09 (61 y.o. [name]) [name], [name] Primary Care Provider: Junious Dresserampbell, [name] Other Clinician: Referring Provider: Treating Provider/Extender: Andree MoroHoffman, [name], [name] in Treatment: 21 Debridement Performed for Assessment: Wound #2 Left,Distal Calcaneus Performed By: Physician Geralyn CorwinHoffman, [name] Knick, DO Debridement Type: Debridement Severity of Tissue Pre Debridement: Fat layer exposed Level of Consciousness (Pre-procedure): Awake and Alert Pre-procedure Verification/Time Out Yes - 08:45 Taken: Start Time: 08:46 Pain Control: Lidocaine 5% topical ointment T Area Debrided (L x W): otal 2.2 (cm) x 4 (cm) = 8.8 (cm) Tissue and other material debrided: Viable, Non-Viable, Slough, Subcutaneous, Skin: Dermis , Skin: Epidermis, Slough Level: Skin/Subcutaneous Tissue Debridement Description: Excisional Instrument: Curette Bleeding: Minimum Hemostasis Achieved: Pressure End Time: 08:53 Procedural Pain: 0 Post Procedural Pain: 0 Response to Treatment: Procedure was tolerated well Level of Consciousness (Post- Awake and Alert procedure): Post Debridement Measurements of Total Wound Length: (cm) 2.2 Width: (cm) 4 Depth: (cm) 0.1 Volume:  (cm) 0.691 Character of Wound/Ulcer Post Debridement: Improved Severity of Tissue Post Debridement: Fat layer exposed Post Procedure Diagnosis Same as Pre-procedure Electronic Signature(s) Signed: 08/04/2022 9:55:18 AM By: Shawn Stalleaton, Bobbi RN, BSN Signed: 08/07/2022 1:40:10 PM By: Geralyn CorwinHoffman, Janyth Riera DO Entered By: Shawn Stalleaton, Bobbi on 08/04/2022 08:53:19 -------------------------------------------------------------------------------- HPI Details Patient Name: Date of Service: [name] IslamHUFFMA N, [name]. 08/04/2022 8:00 [name] Medical Record Number: [id number] Patient Account Number: [id number] Date of Birth/Sex: Treating RN: 1961/06/09 (61 y.o. F) Primary Care Provider: Junious Dresserampbell, [name] Other Clinician: Referring Provider: Treating Provider/Extender: Andree MoroHoffman, Demitri Kucinski [name], [name] in Treatment: 21 History of Present Illness HPI Description: Admission 03/05/2022 Ms. Melizza Lou MinerHuffman is [name] 61 year old female with [name] past medical history of uncontrolled insulin-dependent type 2 diabetes, tobacco user and chronic diastolic heart failure that presents to the clinic for [name] 6612-month history of wound to her left heel. She states she had [name] left ankle fusion in September 2022. She states that she always had [name] wound after the surgery and it never healed. She has home health and they have been doing compression wraps along with silver alginate to the wound bed. She currently denies signs of infection. 6/8; this is [name] 61 year old woman with type 2 diabetes. She developed [name] wound on her left Achilles heel just above the tip of the heel in the setting of recurrent ankle surgeries in late 2022. I have seen some of these results from either the cast or the surgical boots that are put on after these operations. She thinks this may be the case. And [name] sense of pressure ulcer. In any case that she has been using Santyl Hydrofera Blue under compression. She is not wearing any footwear at home as she cannot find anything to  accommodate the wrap 6/22; patient presents for follow-up. She has home health that comes out once [name] week to help with dressing changes. She has no issues or complaints today. Sedor, Jamariyah [name] ([id number]) [id number] Page 4 of 12 We have been using Hydrofera Blue under compression therapy. 7/6; patient presents for follow-up. She states that home health did not come out for the past 2 [name]. It is unclear why. We have been using Hydrofera Blue and Santyl under compression therapy. 7/13; patient presents for follow-up. She did not take the oral antibiotics prescribed at last clinic visit. We have been using Hydrofera Blue with gentamicin/mupirocin ointment under compression therapy. She has no issues or complaints today. She denies signs of infection. 7/20; patient presents for follow-up. We have been using Hydrofera  Blue with antibiotic ointment under 3 layer compression. She has home health that change the dressing once. They put collagen on the wound bed. She also reports falling yesterday and hitting her right foot. She has no pain to the area today. 7/27; patient presents for follow-up. We have been using Hydrofera Blue under 3 layer compression. She has home health that changes the dressing. She has no issues or complaints today. 8/3; patient presents for follow-up. We continues to use Hydrofera Blue under 3 layer compression. She has no issues or complaints today. She has been approved for Epicord at 100%. 8/11; patient presents for follow-up. We have been using Hydrofera Blue under 3 layer compression. She has been approved for Epicord and we have this today. She is in agreement with having this applied. 8/18; patient presents for follow-up. Epicord #1 was placed in standard fashion last clinic visit. She has no issues or complaints today. 9/18; Unfortunately patient has missed her last clinic appointments due to falling and breaking her right ankle. We have been  following her for her left ankle wound. She has also developed [name] large blister to the left heel over the past week that has ruptured and dried. She currently resides In [facility]. Wound9/25; patient presents for follow-up. We have been using Hydrofera Blue and Santyl to the original left ankle wound and Xeroform to the dried blistered. We have been wrapping her with compression therapy. She resides in [name] facility and they changed the wrap once this past week. She has no issues or complaints today. She denies signs of infection. 10/3; patient presents for follow-up. We have been using Xeroform to the heel wound and Epicort to the left ankle wound All under compression therapy. She has no issues or complaints today. 10/10; patient presents for follow-up. We have been using Epicort to the left ankle wound and Santyl and Hydrofera Blue to the heel wound. All under compression therapy. she has no issues or complaints today. 10/16; patient presents for follow-up. Epicort was placed in standard fashion to the superior wound and onto the heel and Santyl and Hydrofera Blue. She states that the wrap got wet during her shower and her facility was able to rewrap with Kerlix/Coban. 10/23; patient presents for follow-up. Epicord was placed to the wound beds last clinic visit. We continue Kerlix/Coban. She has no issues or complaints today. 10/31; Patient presents for follow-up. Epicord was placed to the wound beds last clinic visit. The original wound is almost healed. We have done this under Kerlix/Coban. She denies signs of infection. Electronic Signature(s) Signed: 08/07/2022 1:40:10 PM By: Geralyn Corwin DO Entered By: Geralyn Corwin on 08/04/2022 09:35:23 -------------------------------------------------------------------------------- Physical Exam Details Patient Name: Date of Service: [name] [name], [name]. 08/04/2022 8:00 [name] Medical Record Number: [id number] Patient Account Number: [id number] Date  of Birth/Sex: Treating RN: [birth year] (61 y.o. F) Primary Care Provider: [name] Other Clinician: Referring Provider: Treating Provider/Extender: [name] in Treatment: 21 Constitutional respirations regular, non-labored and within target range for patient.. Cardiovascular 2+ dorsalis pedis/posterior tibialis pulses. Psychiatric pleasant and cooperative. Notes Left foot: T the posterior ankle and heel there is open wounds with granulation tissue and nonviable tissue. No surrounding signs of infection. o Electronic Signature(s) Signed: 08/07/2022 1:40:10 PM By: Geralyn Corwin DO Entered By: Geralyn Corwin on 08/04/2022 09:36:18 Bisping, [name] ([id number]) [id number] Page 5 of 12 -------------------------------------------------------------------------------- Physician Orders Details Patient Name: Date of Service: [name]. 08/04/2022 8:00 [name] Medical  Record Number: [id number] Patient Account Number: [id number] Date of Birth/Sex: Treating RN: 1960/12/21 (61 y.o. [name], [name] Primary Care Provider: [name] Other Clinician: Referring Provider: Treating Provider/Extender: [name] in Treatment: 21 Verbal / Phone Orders: No Diagnosis Coding ICD-10 Coding Code Description [phone] Non-pressure chronic ulcer of other part of left foot with fat layer exposed E11.621 Type 2 diabetes mellitus with foot ulcer E66.01 Morbid (severe) obesity due to excess calories Follow-up Appointments ppointment in 1 week. - Dr. Mikey Bussing Friday 08/14/2022 Return [name] Other: - *****FACILITY CAN CHANGE ONLY THE SECONDARY DRESSING AND COMPRESSION WRAP ONLY. CHANGE ON MONDAY OR TUESDAY.**** LEAVE THE STERI-STRIPS AND ADAPTIC IN PLACE.*** Anesthetic (In clinic) Topical Lidocaine 5% applied to wound bed Cellular or Tissue Based Products Cellular or Tissue Based Product Type: - Run IVR  for EpiFix and Epicord=100% covered 05/07/22: Epicord ordered daptic or Mepitel. (DO NOT REMOVE). - Cellular or Tissue Based Product applied to wound bed, secured with steri-strips, cover with [name] Epicord #1 05/15/25 Epicord #2 05/22/22 Epicord #3 06/29/2022 Epicord #4 07/07/2022 Epifix #5 07/14/22 Epicord #6 07/20/22 Epicord # 7 07/27/22 ***Run for Grafix***PENDING Epicord #8 08/04/2022 Bathing/ Shower/ Hygiene May shower with protection but do not get wound dressing(s) wet. - May use cast protector bag from [facility], [facility] or [employer] Edema Control - Lymphedema / SCD / Other Elevate legs to the level of the heart or above for 30 minutes daily and/or when sitting, [name] frequency of: Avoid standing for long periods of time. Off-Loading Prevalon Boot - use while in bed and chair. Additional Orders / Instructions Follow Nutritious Diet - Monitor/Control Blood Sugar Wound Treatment Wound #1 - Calcaneus Wound Laterality: Left Cleanser: Soap and Water 1 x Per Week/30 Days Discharge Instructions: May shower and wash wound with dial antibacterial soap and water prior to dressing change. Cleanser: Wound Cleanser 1 x Per Week/30 Days Discharge Instructions: Cleanse the wound with wound cleanser prior to applying [name] clean dressing using gauze sponges, not tissue or cotton balls. Peri-Wound Care: Triamcinolone 15 (g) 1 x Per Week/30 Days Discharge Instructions: Use triamcinolone 15 (g) as directed Peri-Wound Care: Sween Lotion (Moisturizing lotion) 1 x Per Week/30 Days Discharge Instructions: Apply moisturizing lotion as directed Prim Dressing: Epicord 1 x Per Week/30 Days ary Discharge Instructions: applied by provider secured with adaptic and steri-strips. Secondary Dressing: ABD Pad, 8x10 1 x Per Week/30 Days Discharge Instructions: Apply over primary dressing as directed. Badal, Laiyla [name] ([id number]) [id number] Page 6 of 12 Secondary Dressing: Woven Gauze Sponge,  Non-Sterile 4x4 in (Home Health) 1 x Per Week/30 Days Discharge Instructions: Apply over primary dressing as directed. Compression Wrap: ThreePress (3 layer compression wrap) (Home Health) 1 x Per Week/30 Days Discharge Instructions: Apply three layer compression as directed. Wound #2 - Calcaneus Wound Laterality: Left, Distal Cleanser: Soap and Water 1 x Per Week/30 Days Discharge Instructions: May shower and wash wound with dial antibacterial soap and water prior to dressing change. Cleanser: Wound Cleanser 1 x Per Week/30 Days Discharge Instructions: Cleanse the wound with wound cleanser prior to applying [name] clean dressing using gauze sponges, not tissue or cotton balls. Peri-Wound Care: Triamcinolone 15 (g) 1 x Per Week/30 Days Discharge Instructions: Use triamcinolone 15 (g) as directed Peri-Wound Care: Sween Lotion (Moisturizing lotion) 1 x Per Week/30 Days Discharge Instructions: Apply moisturizing lotion as directed Topical: Gentamicin 1 x Per Week/30 Days Discharge Instructions: APPLY ONLY IN CLINIC. Topical: Mupirocin Ointment 1 x Per Week/30 Days Discharge Instructions: APPLY ONLY IN CLINIC.  Prim Dressing: Hydrofera Blue Ready Foam, 2.5 x2.5 in 1 x Per Week/30 Days ary Discharge Instructions: APPLY ONLY IN CLINIC. Prim Dressing: Epicord 1 x Per Week/30 Days ary Discharge Instructions: applied by provider secured with adaptic and steri-strips. Secondary Dressing: ABD Pad, 8x10 1 x Per Week/30 Days Discharge Instructions: Apply over primary dressing as directed. Secondary Dressing: Woven Gauze Sponge, Non-Sterile 4x4 in (Home Health) 1 x Per Week/30 Days Discharge Instructions: Apply over primary dressing as directed. Compression Wrap: ThreePress (3 layer compression wrap) (Home Health) 1 x Per Week/30 Days Discharge Instructions: Apply three layer compression as directed. Electronic Signature(s) Signed: 08/07/2022 1:40:10 PM By: Geralyn Corwin DO Entered By: Geralyn Corwin  on 08/04/2022 09:36:26 -------------------------------------------------------------------------------- Problem List Details Patient Name: Date of Service: [name]. 08/04/2022 8:00 [name] Medical Record Number: [id number] Patient Account Number: [id number] Date of Birth/Sex: Treating RN: December 01, 1960 (61 y.o. [name] Primary Care Provider: [name] Other Clinician: Referring Provider: Treating Provider/Extender: [name] in Treatment: 21 Active Problems ICD-10 Encounter Code Description Active Date MDM Diagnosis [phone] Non-pressure chronic ulcer of other part of left foot with fat layer exposed 03/05/2022 No Yes E11.621 Type 2 diabetes mellitus with foot ulcer 03/05/2022 No Yes Adamek, Virdell [name] ([id number]) [id number] Page 7 of 12 E66.01 Morbid (severe) obesity due to excess calories 03/05/2022 No Yes Inactive Problems Resolved Problems Electronic Signature(s) Signed: 08/07/2022 1:40:10 PM By: Geralyn Corwin DO Entered By: Geralyn Corwin on 08/04/2022 09:34:13 -------------------------------------------------------------------------------- Progress Note Details Patient Name: Date of Service: [name]. 08/04/2022 8:00 [name] Medical Record Number: [id number] Patient Account Number: [id number] Date of Birth/Sex: Treating RN: 01/27/1961 (61 y.o. F) Primary Care Provider: [name] Other Clinician: Referring Provider: Treating Provider/Extender: [name] in Treatment: 21 Subjective Chief Complaint Information obtained from Patient 03/05/2022; left foot wound History of Present Illness (HPI) Admission 03/05/2022 Ms. Val Wrubel is [name] 61 year old female with [name] past medical history of uncontrolled insulin-dependent type 2 diabetes, tobacco user and chronic diastolic heart failure that presents to the clinic for [name] 78-month history of wound to her left heel. She  states she had [name] left ankle fusion in September 2022. She states that she always had [name] wound after the surgery and it never healed. She has home health and they have been doing compression wraps along with silver alginate to the wound bed. She currently denies signs of infection. 6/8; this is [name] 61 year old woman with type 2 diabetes. She developed [name] wound on her left Achilles heel just above the tip of the heel in the setting of recurrent ankle surgeries in late 2022. I have seen some of these results from either the cast or the surgical boots that are put on after these operations. She thinks this may be the case. And [name] sense of pressure ulcer. In any case that she has been using Santyl Hydrofera Blue under compression. She is not wearing any footwear at home as she cannot find anything to accommodate the wrap 6/22; patient presents for follow-up. She has home health that comes out once [name] week to help with dressing changes. She has no issues or complaints today. We have been using Hydrofera Blue under compression therapy. 7/6; patient presents for follow-up. She states that home health did not come out for the past 2 [name]. It is unclear why. We have been using Hydrofera Blue and Santyl under compression therapy. 7/13; patient presents for follow-up. She did not take  the oral antibiotics prescribed at last clinic visit. We have been using Hydrofera Blue with gentamicin/mupirocin ointment under compression therapy. She has no issues or complaints today. She denies signs of infection. 7/20; patient presents for follow-up. We have been using Hydrofera Blue with antibiotic ointment under 3 layer compression. She has home health that change the dressing once. They put collagen on the wound bed. She also reports falling yesterday and hitting her right foot. She has no pain to the area today. 7/27; patient presents for follow-up. We have been using Hydrofera Blue under 3 layer compression. She has home  health that changes the dressing. She has no issues or complaints today. 8/3; patient presents for follow-up. We continues to use Hydrofera Blue under 3 layer compression. She has no issues or complaints today. She has been approved for Epicord at 100%. 8/11; patient presents for follow-up. We have been using Hydrofera Blue under 3 layer compression. She has been approved for Epicord and we have this today. She is in agreement with having this applied. 8/18; patient presents for follow-up. Epicord #1 was placed in standard fashion last clinic visit. She has no issues or complaints today. 9/18; Unfortunately patient has missed her last clinic appointments due to falling and breaking her right ankle. We have been following her for her left ankle wound. She has also developed [name] large blister to the left heel over the past week that has ruptured and dried. She currently resides In [facility]. Wound9/25; patient presents for follow-up. We have been using Hydrofera Blue and Santyl to the original left ankle wound and Xeroform to the dried blistered. We have been wrapping her with compression therapy. She resides in [name] facility and they changed the wrap once this past week. She has no issues or complaints today. She denies signs of infection. 10/3; patient presents for follow-up. We have been using Xeroform to the heel wound and Epicort to the left ankle wound All under compression therapy. She has no issues or complaints today. Mutch, [name] ([id number]) [id number] Page 8 of 12 10/10; patient presents for follow-up. We have been using Epicort to the left ankle wound and Santyl and Hydrofera Blue to the heel wound. All under compression therapy. she has no issues or complaints today. 10/16; patient presents for follow-up. Epicort was placed in standard fashion to the superior wound and onto the heel and Santyl and Hydrofera Blue. She states that the wrap got wet during her  shower and her facility was able to rewrap with Kerlix/Coban. 10/23; patient presents for follow-up. Epicord was placed to the wound beds last clinic visit. We continue Kerlix/Coban. She has no issues or complaints today. 10/31; Patient presents for follow-up. Epicord was placed to the wound beds last clinic visit. The original wound is almost healed. We have done this under Kerlix/Coban. She denies signs of infection. Patient History Information obtained from Patient, Chart. Family History Cancer - Father,Mother, Diabetes - Mother,Maternal Grandparents,Paternal Grandparents, Heart Disease - Siblings,Maternal Grandparents, Hypertension - Siblings, Tuberculosis - Maternal Grandparents,Paternal Grandparents. Social History Current every day smoker, Marital Status - Single, Alcohol Use - Never, Drug Use - No History, Caffeine Use - Daily. 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 Vitals Time Taken: 7:55 AM, Height: 66 in, Weight: 339 lbs, BMI: 54.7, Temperature: 98.6 F, Pulse: 88 bpm, Respiratory Rate: 20 breaths/min, Blood Pressure: 191/76 mmHg. General Notes: per patient in pain and SNF did not provide pain medication. With pain 8/10 increases her BP. Cardiovascular 2+ dorsalis pedis/posterior tibialis pulses. Psychiatric pleasant and cooperative. General  Notes: Left foot: T the posterior ankle and heel there is open wounds with granulation tissue and nonviable tissue. No surrounding signs of infection. o Integumentary (Hair, Skin) Wound #1 status is Open. Original cause of wound was Other Lesion. The date acquired was: 10/06/2021. The wound has been in treatment 21 [name]. The wound is located on the Left Calcaneus. The wound measures 0.3cm length x 0.3cm width x 0.1cm depth; 0.071cm^2 area and 0.007cm^3 volume. There is no tunneling or undermining noted. There is [name] small amount of drainage noted. The wound margin is distinct with the outline attached to the wound base. There is large (67-100%) red granulation within the wound bed. There is no necrotic tissue within the wound bed. The periwound skin appearance had no abnormalities noted for color. The periwound skin appearance exhibited: Callus. The periwound skin appearance did not exhibit: Crepitus, Excoriation, Induration, Rash, Scarring, Dry/Scaly, Maceration. Periwound temperature was noted as No Abnormality. Wound #2 status is Open. Original cause of wound was Blister. The date acquired was: 06/15/2022. The wound has been in treatment 5 [name]. The wound is located on the Left,Distal Calcaneus. The wound measures 2.2cm length x 4cm width x 0.1cm depth; 6.912cm^2 area and 0.691cm^3 volume. There is Fat Layer (Subcutaneous Tissue) exposed. There is no tunneling or undermining noted. There is [name] medium amount of serosanguineous drainage noted. The wound margin is distinct with the outline attached to the wound base. There is large (67-100%) red, pink granulation within the wound bed. There is [name] small (1-33%) amount of necrotic tissue within the wound bed including Adherent Slough. The periwound skin appearance had no abnormalities noted for texture. The periwound skin appearance had no abnormalities noted for color. The periwound skin appearance did not exhibit: Dry/Scaly, Maceration. Periwound  temperature was noted as No Abnormality. [place], [name] ([id number]) [id number] Page 9 of 12 Assessment Active Problems ICD-10 Non-pressure chronic ulcer of other part of left foot with fat layer exposed Type 2 diabetes mellitus with foot ulcer Morbid (severe) obesity due to excess calories Patient has 2 wounds. They appear well healing. The original wound which is right above the heel and is almost closed. I debrided nonviable tissue to both wounds. Epi cord was placed in standard fashion today T the more proximal wound (original wound). Leftover epi cord was used on the distal heel wound o Over healthy granulation tissue but only to half the wound bed. The remaining wound bed received Hydrofera Blue with antibiotic ointment and Santyl. Continued Kerlix/Coban. Follow-up in 1 week. Procedures Wound #2 Pre-procedure diagnosis of Wound #2 is [name] Diabetic Wound/Ulcer of the Lower Extremity located on the Left,Distal Calcaneus .Severity of Tissue Pre Debridement is: Fat layer exposed. There was [name] Excisional Skin/Subcutaneous Tissue Debridement with [name] total area of 8.8 sq cm performed by Geralyn Corwin, DO. With the following instrument(s): Curette to remove Viable and Non-Viable tissue/material. Material removed includes Subcutaneous Tissue, Slough, Skin: Dermis, and Skin: Epidermis after achieving pain control using Lidocaine 5% topical ointment.  [name] time out was conducted at 08:45, prior to the start of the procedure. [name] Minimum amount of bleeding was controlled with Pressure. The procedure was tolerated well with [name] pain level of 0 throughout and [name] pain level of 0 following the procedure. Post Debridement Measurements: 2.2cm length x 4cm width x 0.1cm depth; 0.691cm^3 volume. Character of Wound/Ulcer Post Debridement is improved. Severity of Tissue Post Debridement is: Fat layer exposed. Post procedure Diagnosis Wound #2: Same as Pre-Procedure Pre-procedure diagnosis of  Wound #2 is [name] Diabetic Wound/Ulcer of the Lower Extremity located on the Left,Distal Calcaneus. [name] skin graft procedure using [name] bioengineered skin substitute/cellular or tissue based product was performed by Geralyn Corwin, DO with the following instrument(s): Forceps and Scissors. Epicord was applied and secured with Steri-Strips and adaptic. 4 sq cm of product was utilized and 0 sq cm was wasted. Post Application, no dressing was applied. [name] Time Out was conducted at 08:45, prior to the start of the procedure. The procedure was tolerated well with [name] pain level of 0 throughout and [name] pain level of 0 following the procedure. Post procedure Diagnosis Wound #2: Same as Pre-Procedure . Pre-procedure diagnosis of Wound #2 is [name] Diabetic Wound/Ulcer of the Lower Extremity located on the Left,Distal Calcaneus . There was [name] Three Layer Compression Therapy Procedure by Shawn Stall, RN. Post procedure Diagnosis Wound #2: Same as Pre-Procedure Wound #1 Pre-procedure diagnosis of Wound #1 is [name] Diabetic Wound/Ulcer of the Lower Extremity located on the Left Calcaneus. [name] skin graft procedure using [name] bioengineered skin substitute/cellular or tissue based product was performed by Geralyn Corwin, DO with the following instrument(s): Forceps and Scissors. Epicord was applied and secured with Steri-Strips and adaptic. 1 sq cm of product was utilized and 1 sq cm was wasted due to wound size. Post Application, no dressing was applied. [name] Time Out was conducted at 08:45, prior to the start of the procedure. The procedure was tolerated well with [name] pain level of 0 throughout and [name] pain level of 0 following the procedure. Post procedure Diagnosis Wound #1: Same as Pre-Procedure . Pre-procedure diagnosis of Wound #1 is [name] Diabetic Wound/Ulcer of the Lower Extremity located on the Left Calcaneus . There was [name] Three Layer Compression Therapy Procedure by Shawn Stall, RN. Post procedure Diagnosis Wound #1: Same as  Pre-Procedure Plan Follow-up Appointments: Return Appointment in 1 week. - Dr. Mikey Bussing Friday 08/14/2022 Other: - *****FACILITY CAN CHANGE ONLY THE SECONDARY DRESSING AND COMPRESSION WRAP ONLY. CHANGE ON MONDAY OR TUESDAY.**** LEAVE THE STERI-STRIPS AND ADAPTIC IN PLACE.*** Anesthetic: (In clinic) Topical Lidocaine 5% applied to wound bed Cellular or Tissue Based Products: Cellular or Tissue Based Product Type: - Run IVR for EpiFix and Epicord=100% covered 05/07/22: Epicord ordered Cellular or Tissue Based Product applied to wound bed, secured with steri-strips, cover with Adaptic or Mepitel. (DO NOT REMOVE). - Epicord #1 05/15/25 Epicord #2 05/22/22 Epicord #3 06/29/2022 Epicord #4 07/07/2022 Epifix #5 07/14/22 Epicord #6 07/20/22 Epicord # 7 07/27/22 ***Run for Grafix***PENDING Epicord #8 08/04/2022 Bathing/ Shower/ Hygiene: May shower with protection but do not get wound dressing(s) wet. - May use cast protector bag from [facility], [facility] or [employer] Edema Control - Lymphedema / SCD / Other: Elevate legs to the level of the heart or above for 30 minutes daily and/or when sitting, [name] frequency of: Avoid standing for long periods of time. Off-Loading: Prevalon Boot - use while in bed and chair. Additional Orders / Instructions: Follow Nutritious Diet - Monitor/Control Blood Sugar WOUND #  1: - Calcaneus Wound Laterality: Left Cleanser: Soap and Water 1 x Per Week/30 Days Discharge Instructions: May shower and wash wound with dial antibacterial soap and water prior to dressing change. [place], [name] ([id number]) [id number] Page 10 of 12 Cleanser: Wound Cleanser 1 x Per Week/30 Days Discharge Instructions: Cleanse the wound with wound cleanser prior to applying [name] clean dressing using gauze sponges, not tissue or cotton balls. Peri-Wound Care: Triamcinolone 15 (g) 1 x Per Week/30 Days Discharge Instructions: Use triamcinolone 15 (g) as directed Peri-Wound Care: Sween Lotion  (Moisturizing lotion) 1 x Per Week/30 Days Discharge Instructions: Apply moisturizing lotion as directed Prim Dressing: Epicord 1 x Per Week/30 Days ary Discharge Instructions: applied by provider secured with adaptic and steri-strips. Secondary Dressing: ABD Pad, 8x10 1 x Per Week/30 Days Discharge Instructions: Apply over primary dressing as directed. Secondary Dressing: Woven Gauze Sponge, Non-Sterile 4x4 in (Home Health) 1 x Per Week/30 Days Discharge Instructions: Apply over primary dressing as directed. Com pression Wrap: ThreePress (3 layer compression wrap) (Home Health) 1 x Per Week/30 Days Discharge Instructions: Apply three layer compression as directed. WOUND #2: - Calcaneus Wound Laterality: Left, Distal Cleanser: Soap and Water 1 x Per Week/30 Days Discharge Instructions: May shower and wash wound with dial antibacterial soap and water prior to dressing change. Cleanser: Wound Cleanser 1 x Per Week/30 Days Discharge Instructions: Cleanse the wound with wound cleanser prior to applying [name] clean dressing using gauze sponges, not tissue or cotton balls. Peri-Wound Care: Triamcinolone 15 (g) 1 x Per Week/30 Days Discharge Instructions: Use triamcinolone 15 (g) as directed Peri-Wound Care: Sween Lotion (Moisturizing lotion) 1 x Per Week/30 Days Discharge Instructions: Apply moisturizing lotion as directed Topical: Gentamicin 1 x Per Week/30 Days Discharge Instructions: APPLY ONLY IN CLINIC. Topical: Mupirocin Ointment 1 x Per Week/30 Days Discharge Instructions: APPLY ONLY IN CLINIC. Prim Dressing: Hydrofera Blue Ready Foam, 2.5 x2.5 in 1 x Per Week/30 Days ary Discharge Instructions: APPLY ONLY IN CLINIC. Prim Dressing: Epicord 1 x Per Week/30 Days ary Discharge Instructions: applied by provider secured with adaptic and steri-strips. Secondary Dressing: ABD Pad, 8x10 1 x Per Week/30 Days Discharge Instructions: Apply over primary dressing as directed. Secondary Dressing:  Woven Gauze Sponge, Non-Sterile 4x4 in (Home Health) 1 x Per Week/30 Days Discharge Instructions: Apply over primary dressing as directed. Com pression Wrap: ThreePress (3 layer compression wrap) (Home Health) 1 x Per Week/30 Days Discharge Instructions: Apply three layer compression as directed. 1. In office sharp debridement 2. Epicort placed in standard fashion 3. Follow-up in 1 week 4. Hydrofera Blue with antibiotic ointment and Santyl 5. Kerlix/Coban Electronic Signature(s) Signed: 08/05/2022 10:46:15 AM By: Shawn Stall RN, BSN Signed: 08/07/2022 1:40:10 PM By: Geralyn Corwin DO Entered By: Shawn Stall on 08/04/2022 11:02:50 -------------------------------------------------------------------------------- HxROS Details Patient Name: Date of Service: [name], [name]. 08/04/2022 8:00 [name] Medical Record Number: [id number] Patient Account Number: [id number] Date of Birth/Sex: Treating RN: Dec 19, 1960 (61 y.o. F) Primary Care Provider: [name] Other Clinician: Referring Provider: Treating Provider/Extender: [name] in Treatment: 21 Information Obtained From Patient Chart Hematologic/Lymphatic Medical History: Positive for: Anemia Respiratory Medical History: Positive for: Chronic Obstructive Pulmonary Disease (COPD) [place]hman, [name] ([id number]) [id number] Page 11 of 12 Cardiovascular Medical History: Positive for: Congestive Heart Failure - Weighs self daily; Hypertension; Myocardial Infarction; Peripheral Venous Disease Gastrointestinal Medical History: Past Medical History Notes: GERD, Peptic Ulcer Disease Endocrine Medical History: Positive for: Type II Diabetes Time with diabetes: 16 years  Treated with: Insulin, Oral agents Blood sugar tested every day: Yes Tested : Twice daily Musculoskeletal Medical History: Past Medical History Notes: Broke left leg 3 years ago, 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,  foot fused Neurologic Medical History: Positive for: Neuropathy Psychiatric Medical History: Past Medical History Notes: Depression Immunizations Pneumococcal Vaccine: Received Pneumococcal Vaccination: No Implantable Devices None Family and Social History Cancer: Yes - Father,Mother; Diabetes: Yes - Mother,Maternal Grandparents,Paternal Grandparents; Heart Disease: Yes - Siblings,Maternal Grandparents; Hypertension: Yes - Siblings; Tuberculosis: Yes - Maternal Grandparents,Paternal Grandparents; Current every day smoker; Marital Status - Single; Alcohol Use: Never; Drug Use: No History; Caffeine Use: Daily; Financial Concerns: No; Food, Clothing or Shelter Needs: No; Support System Lacking: No; Transportation Concerns: No Electronic Signature(s) Signed: 08/07/2022 1:40:10 PM By: Kalman Shan DO Entered By: Kalman Shan on 08/04/2022 09:35:29 -------------------------------------------------------------------------------- SuperBill Details Patient Name: Date of Service: [name] Lax [name]. 08/04/2022 Medical Record Number: [id number] Patient Account Number: [id number] Date of Birth/Sex: Treating RN: 11-16-1960 (61 y.o. F) Primary Care Provider: Jenean Lindau Other Clinician: Referring Provider: Treating Provider/Extender: Elise Benne in Treatment: 21 Diagnosis Coding ICD-10 Codes Code Description [place], [name] ([id number]) [id number] Page 12 of 12 [phone] Non-pressure chronic ulcer of other part of left foot with fat layer exposed E11.621 Type 2 diabetes mellitus with foot ulcer E66.01 Morbid (severe) obesity due to excess calories Facility Procedures : CPT4 Code: 17510258 Description: [id number] Epicord 2cm x 3cm - per sqcm Modifier: Quantity: 6 : CPT4 Code: 24235361 Description: 44315 - SKIN SUB GRAFT FACE/NK/HF/G ICD-10 Diagnosis Description L97.522 Non-pressure chronic ulcer of other part of left foot  with fat layer exposed Modifier: Quantity: 1 Physician Procedures : CPT4 Code Description Modifier [id number] - WC PHYS SKIN SUB GRAFT FACE/NK/HF/G ICD-10 Diagnosis Description L97.522 Non-pressure chronic ulcer of other part of left foot with fat layer exposed Quantity: 1 Electronic Signature(s) Signed: 08/05/2022 10:46:15 AM By: Deon Pilling RN, BSN Signed: 08/07/2022 1:40:10 PM By: Kalman Shan DO Entered By: Deon Pilling on 08/04/2022 11:03:02

## 2022-08-07 NOTE — Progress Notes (Signed)
Slocumb, Sanam [name] ([id number]) [id number] Page 1 of 10 Visit Report for 08/04/2022 Arrival Information Details Patient Name: Date of Service: [name]. 08/04/2022 8:00 [name] Medical Record Number: [id number] Patient Account Number: [id number] Date of Birth/Sex: Treating [name]: December 25, 1960 (61 y.o. [name], [name] Primary Care : [name] Other Clinician: Referring : Treating /Extender: [name] in Treatment: 21 Visit Information History Since Last Visit Added or deleted any medications: No Patient Arrived: Wheel Chair Any new allergies or adverse reactions: No Arrival Time: 07:55 Had [name] fall or experienced change in No Accompanied By: self activities of daily living that may affect Transfer Assistance: Manual risk of falls: Patient Identification Verified: Yes Signs or symptoms of abuse/neglect since last visito No Secondary Verification Process Completed: Yes Hospitalized since last visit: No Patient Requires Transmission-Based Precautions: No Implantable device outside of the clinic excluding No Patient Has Alerts: No cellular tissue based products placed in the center since last visit: Has Dressing in Place as Prescribed: Yes Has Compression in Place as Prescribed: Yes Pain Present Now: Yes Electronic Signature(s) Signed: 08/04/2022 9:55:18 AM By: [name], [name] Entered By: [name] Pilling on 08/04/2022 08:11:11 -------------------------------------------------------------------------------- Compression Therapy Details Patient Name: Date of Service: [name]. 08/04/2022 8:00 [name] Medical Record Number: [id number] Patient Account Number: [id number] Date of Birth/Sex: Treating [name]: 07-03-1961 (61 y.o. [name] Primary Care : [name] Other Clinician: Referring : Treating /Extender: [name] in  Treatment: 21 Compression Therapy Performed for Wound Assessment: Wound #1 Left Calcaneus Performed By: Clinician [name] Pilling, [name] Compression Type: Three Layer Post Procedure Diagnosis Same as Pre-procedure Electronic Signature(s) Signed: 08/04/2022 9:55:18 AM By: [name], [name] Entered By: [name] Pilling on 08/04/2022 08:57:51 [name], [name] ([id number]) [id number] Page 2 of 10 -------------------------------------------------------------------------------- Compression Therapy Details Patient Name: Date of Service: [name]. 08/04/2022 8:00 [name] Medical Record Number: [id number] Patient Account Number: [id number] Date of Birth/Sex: Treating [name]: [birth year] (61 y.o. [name] Primary Care : [name] Other Clinician: Referring : Treating /Extender: [name] in Treatment: 21 Compression Therapy Performed for Wound Assessment: Wound #2 Left,Distal Calcaneus Performed By: Clinician [name] Pilling, [name] Compression Type: Three Layer Post Procedure Diagnosis Same as Pre-procedure Electronic Signature(s) Signed: 08/04/2022 9:55:18 AM By: [name], [name] Entered By: [name] Pilling on 08/04/2022 08:57:51 -------------------------------------------------------------------------------- Encounter Discharge Information Details Patient Name: Date of Service: [name]. 08/04/2022 8:00 [name] Medical Record Number: [id number] Patient Account Number: [id number] Date of Birth/Sex: Treating [name]: 17-Sep-1961 (61 y.o. [name], [name] Primary Care : [name] Other Clinician: Referring : Treating /Extender: [name] in Treatment: 21 Encounter Discharge Information Items Post Procedure Vitals Discharge Condition: Stable Temperature (F): 98.6 Ambulatory Status: Wheelchair Pulse (bpm): 88 Discharge Destination:  [place] Respiratory Rate (breaths/min): 20 Telephoned: No Blood Pressure (mmHg): 191/76 Orders Sent: Yes Transportation: Private Auto Accompanied By: self Schedule Follow-up Appointment: Yes Clinical Summary of Care: Electronic Signature(s) Signed: 08/05/2022 10:46:15 AM By: [name], [name] Entered By: [name] Pilling on 08/04/2022 11:03:45 -------------------------------------------------------------------------------- Lower Extremity Assessment Details Patient Name: Date of Service: [name], [name]. 08/04/2022 8:00 [name] Medical Record Number: [id number] Patient Account Number: [id number] Date of Birth/Sex: Treating [name]: [birth year] (61 y.o. [name], [name] Primary Care : [name] Other Clinician: Referring : Treating /Extender: [name] in Treatment: 21 Edema  Assessment Assessed: [Left: Yes] [Right: No] Edema: [Left: Ye] [Right: s] H[Left: [name] ([id number])] [Right: [id number] Page 3 of 10] Calf Left: Right: Point of Measurement: 28 cm From Medial Instep 49 cm Ankle Left: Right: Point of Measurement: 3 cm From Medial Instep 29 cm Vascular Assessment Pulses: Dorsalis Pedis Palpable: [Left:Yes] Electronic Signature(s) Signed: 08/04/2022 9:55:18 AM By: [name], [name] Entered By: [name] Pilling on 08/04/2022 08:12:11 -------------------------------------------------------------------------------- Multi Wound Chart Details Patient Name: Date of Service: [name]. 08/04/2022 8:00 [name] Medical Record Number: [id number] Patient Account Number: [id number] Date of Birth/Sex: Treating [name]: 24-Aug-1961 (61 y.o. F) Primary Care : [name] Other Clinician: Referring : Treating /Extender: [name] in Treatment: 21 Vital Signs Height(in): 62 Pulse(bpm): 10 Weight(lbs): 339 Blood  Pressure(mmHg): 191/76 Body Mass Index(BMI): 54.7 Temperature(F): 98.6 Respiratory Rate(breaths/min): 20 [1:Photos:] [[name]:[name]] Left Calcaneus Left, Distal Calcaneus [name] Wound Location: Other Lesion Blister [name] Wounding Event: Diabetic Wound/Ulcer of the Lower Diabetic Wound/Ulcer of the Lower [name] Primary Etiology: Extremity Extremity Anemia, Chronic Obstructive Anemia, Chronic Obstructive [name] Comorbid History: Pulmonary Disease (COPD), Pulmonary Disease (COPD), Congestive Heart Failure, Congestive Heart Failure, Hypertension, Myocardial Infarction, Hypertension, Myocardial Infarction, Peripheral Venous Disease, Type II Peripheral Venous Disease, Type II Diabetes, Neuropathy Diabetes, Neuropathy 10/06/2021 06/15/2022 [name] Date Acquired: 21 5 [name] Weeks of Treatment: Open Open [name] Wound Status: No No [name] Wound Recurrence: 0.3x0.3x0.1 2.2x4x0.1 [name] Measurements L x W x D (cm) 0.071 6.912 [name] (cm) : rea 0.007 0.691 [name] Volume (cm) : 98.40% 51.10% [name] % Reduction in [name] rea: 99.20% 51.10% [name] % Reduction in Volume: Grade 2 Grade 2 [name] Classification: Small Medium [name] Exudate [name] mount: [name] Serosanguineous [name] Exudate Type: Old, Tracyann [name] ([id number]) [id number] Page 4 of 10 [name] red, brown [name] Exudate Color: Distinct, outline attached Distinct, outline attached [name] Wound Margin: Large (67-100%) Large (67-100%) [name] Granulation Amount: Red Red, Pink [name] Granulation Quality: None Present (0%) Small (1-33%) [name] Necrotic Amount: Fascia: No Fat Layer (Subcutaneous Tissue): Yes [name] Exposed Structures: Fat Layer (Subcutaneous Tissue): No Fascia: No Tendon: No Tendon: No Muscle: No Muscle: No Joint: No Joint: No Bone: No Bone: No Large (67-100%) Small (1-33%) [name] Epithelialization: [name] Debridement - Excisional [name] Debridement: Pre-procedure Verification/Time Out [name] 08:45 [name] Taken: [name] Lidocaine 5% topical ointment [name] Pain  Control: [name] Subcutaneous, Slough [name] Tissue Debrided: [name] Skin/Subcutaneous Tissue [name] Level: [name] 8.8 [name] Debridement [name] (sq cm): rea [name] Curette [name] Instrument: [name] Minimum [name] Bleeding: [name] Pressure [name] Hemostasis [name] chieved: [name] 0 [name] Procedural Pain: [name] 0 [name] Post Procedural Pain: [name] Procedure was tolerated well [name] Debridement Treatment Response: [name] 2.2x4x0.1 [name] Post Debridement Measurements L x W x D (cm) [name] 0.691 [name] Post Debridement Volume: (cm) Callus: Yes No Abnormalities Noted [name] Periwound Skin Texture: Excoriation: No Induration: No Crepitus: No Rash: No Scarring: No Maceration: No Maceration: No [name] Periwound Skin Moisture: Dry/Scaly: No Dry/Scaly: No No Abnormalities Noted No Abnormalities Noted [name] Periwound Skin Color: No Abnormality No Abnormality [name] Temperature: Cellular or Tissue Based Product Cellular or Tissue Based Product [name] Procedures Performed: Compression Therapy Compression Therapy Debridement Treatment Notes Electronic Signature(s) Signed: 08/07/2022 1:40:10 PM By: [name] Shan DO Entered By: [name] on 08/04/2022 09:34:18 -------------------------------------------------------------------------------- Multi-Disciplinary Care Plan Details Patient Name: Date of Service: [name]. 08/04/2022 8:00 [name] Medical Record Number: [id number] Patient Account Number: [id number] Date of Birth/Sex: Treating [name]: 1961-03-16 (61 y.o. [name] Primary Care :  [name] Other Clinician: Referring : Treating /Extender: [name] in Treatment: 21 Active Inactive Abuse / Safety / Falls / Self Care Management Nursing Diagnoses: History of Falls Goals: Patient will not experience any injury related to falls Date Initiated: 03/05/2022 Target Resolution Date: 09/04/2022 Goal Status: Active Interventions: [employer]gen[employer], Paidyn [name] ([id number])  [id number] Page 5 of 10 Assess fall risk on admission and as needed Assess: immobility, friction, shearing, incontinence upon admission and as needed Assess impairment of mobility on admission and as needed per policy Notes: 01/31/75: Fall prevention ongoing, recent fall. Wound/Skin Impairment Nursing Diagnoses: Impaired tissue integrity Goals: Patient/caregiver will verbalize understanding of skin care regimen Date Initiated: 03/05/2022 Target Resolution Date: 10/02/2022 Goal Status: Active Ulcer/skin breakdown will have [name] volume reduction of 30% by week 4 Date Initiated: 03/05/2022 Date Inactivated: 04/30/2022 Target Resolution Date: 05/02/2022 Goal Status: Met Ulcer/skin breakdown will have [name] volume reduction of 50% by week 8 Date Initiated: 04/30/2022 Date Inactivated: 07/07/2022 Target Resolution Date: 07/04/2022 Unmet Reason: patient sent three Goal Status: Unmet weeks inpatient and rehab before returning to wound center. Interventions: Assess patient/caregiver ability to obtain necessary supplies Assess patient/caregiver ability to perform ulcer/skin care regimen upon admission and as needed Assess ulceration(s) every visit Provide education on smoking Provide education on ulcer and skin care Treatment Activities: Topical wound management initiated : 03/05/2022 Notes: 04/30/22: Wound care regimen continues. Electronic Signature(s) Signed: 08/04/2022 9:55:18 AM By: [name], [name] Entered By: [name] Pilling on 08/04/2022 08:18:34 -------------------------------------------------------------------------------- Pain Assessment Details Patient Name: Date of Service: [name], [name]. 08/04/2022 8:00 [name] Medical Record Number: [id number] Patient Account Number: [id number] Date of Birth/Sex: Treating [name]: 01-01-1961 (61 y.o. [name] Primary Care : [name] Other Clinician: Referring : Treating /Extender:  [name] in Treatment: 21 Active Problems Location of Pain Severity and Description of Pain Patient Has Paino Yes Site Locations Pain Location: [place], Julita [name] ([id number]) [id number] Page 6 of 10 Pain Location: Pain in Ulcers Rate the pain. Current Pain Level: 8 Pain Management and Medication Current Pain Management: Medication: No Cold Application: No Rest: No Massage: No Activity: No T.E.[name].S.: No Heat Application: No Leg drop or elevation: No Is the Current Pain Management Adequate: Adequate How does your wound impact your activities of daily livingo Sleep: No Bathing: No Appetite: No Relationship With Others: No Bladder Continence: No Emotions: No Bowel Continence: No Work: No Toileting: No Drive: No Dressing: No Hobbies: No Electronic Signature(s) Signed: 08/04/2022 9:55:18 AM By: [name], [name] Entered By: [name] Pilling on 08/04/2022 08:11:59 -------------------------------------------------------------------------------- Patient/Caregiver Education Details Patient Name: Date of Service: [name] Crease. 10/31/2023andnbsp8:00 [name] Medical Record Number: [id number] Patient Account Number: [id number] Date of Birth/Gender: Treating [name]: 21-Dec-1960 (61 y.o. [name] Primary Care Physician: [name] Other Clinician: Referring Physician: Treating Physician/Extender: [name] in Treatment: 21 Education Assessment Education Provided To: Patient Education Topics Provided Wound/Skin Impairment: Handouts: Skin Care Do's and Dont's Methods: Explain/Verbal Responses: Reinforcements needed Electronic Signature(s) Signed: 08/04/2022 9:55:18 AM By: [name], [name] Entered By: [name] Pilling on 08/04/2022 08:18:44 [name], [name] ([id number]) [id number] Page 7 of  10 -------------------------------------------------------------------------------- Wound Assessment Details Patient Name: Date of Service: [name]. 08/04/2022 8:00 [name] Medical Record Number: [id number] Patient Account Number: [id number] Date of Birth/Sex: Treating [name]: 1961-06-06 (61 y.o. [name], [name] Primary Care : [name] Other Clinician: Referring : Treating /Extender: [name],  [name] in Treatment: 21 Wound Status Wound Number: 1 Primary Diabetic Wound/Ulcer of the Lower Extremity Etiology: Wound Location: Left Calcaneus Wound Open Wounding Event: Other Lesion Status: Date Acquired: 10/06/2021 Comorbid Anemia, Chronic Obstructive Pulmonary Disease (COPD), Weeks Of Treatment: 21 History: Congestive Heart Failure, Hypertension, Myocardial Infarction, Clustered Wound: No Peripheral Venous Disease, Type II Diabetes, Neuropathy Photos Wound Measurements Length: (cm) 0 Width: (cm) 0 Depth: (cm) 0 Area: (cm) Volume: (cm) .3 % Reduction in Area: 98.4% .3 % Reduction in Volume: 99.2% .1 Epithelialization: Large (67-100%) 0.071 Tunneling: No 0.007 Undermining: No Wound Description Classification: Grade 2 Wound Margin: Distinct, outline attached Exudate Amount: Small Foul Odor After Cleansing: No Slough/Fibrino No Wound Bed Granulation Amount: Large (67-100%) Exposed Structure Granulation Quality: Red Fascia Exposed: No Necrotic Amount: None Present (0%) Fat Layer (Subcutaneous Tissue) Exposed: No Tendon Exposed: No Muscle Exposed: No Joint Exposed: No Bone Exposed: No Periwound Skin Texture Texture Color No Abnormalities Noted: No No Abnormalities Noted: Yes Callus: Yes Temperature / Pain Crepitus: No Temperature: No Abnormality Excoriation: No Induration: No Rash: No Scarring: No Moisture No Abnormalities Noted: No Dry / Scaly: No Maceration: No Goelz, Kyarra [name] ([id number])  [id number] Page 8 of 10 Treatment Notes Wound #1 (Calcaneus) Wound Laterality: Left Cleanser Soap and Water Discharge Instruction: May shower and wash wound with dial antibacterial soap and water prior to dressing change. Wound Cleanser Discharge Instruction: Cleanse the wound with wound cleanser prior to applying [name] clean dressing using gauze sponges, not tissue or cotton balls. Peri-Wound Care Triamcinolone 15 (g) Discharge Instruction: Use triamcinolone 15 (g) as directed Sween Lotion (Moisturizing lotion) Discharge Instruction: Apply moisturizing lotion as directed Topical Primary Dressing Epicord Discharge Instruction: applied by  secured with adaptic and steri-strips. Secondary Dressing ABD Pad, 8x10 Discharge Instruction: Apply over primary dressing as directed. Woven Gauze Sponge, Non-Sterile 4x4 in Discharge Instruction: Apply over primary dressing as directed. Secured With Compression Wrap ThreePress (3 layer compression wrap) Discharge Instruction: Apply three layer compression as directed. Compression Stockings Add-Ons Electronic Signature(s) Signed: 08/04/2022 9:55:18 AM By: [name], [name] Entered By: [name] Pilling on 08/04/2022 08:50:23 -------------------------------------------------------------------------------- Wound Assessment Details Patient Name: Date of Service: [name], [name]. 08/04/2022 8:00 [name] Medical Record Number: [id number] Patient Account Number: [id number] Date of Birth/Sex: Treating [name]: Apr 13, 1961 (61 y.o. [name], [name] Primary Care : [name] Other Clinician: Referring : Treating /Extender: [name] in Treatment: 21 Wound Status Wound Number: 2 Primary Diabetic Wound/Ulcer of the Lower Extremity Etiology: Wound Location: Left, Distal Calcaneus Wound Open Wounding Event: Blister Status: Date Acquired: 06/15/2022 Comorbid  Anemia, Chronic Obstructive Pulmonary Disease (COPD), Weeks Of Treatment: 5 History: Congestive Heart Failure, Hypertension, Myocardial Infarction, Clustered Wound: No Peripheral Venous Disease, Type II Diabetes, Neuropathy Photos Tamburro, Topacio [name] ([id number]) [id number] Page 9 of 10 Wound Measurements Length: (cm) 2.2 Width: (cm) 4 Depth: (cm) 0.1 Area: (cm) 6.912 Volume: (cm) 0.691 % Reduction in Area: 51.1% % Reduction in Volume: 51.1% Epithelialization: Small (1-33%) Tunneling: No Undermining: No Wound Description Classification: Grade 2 Wound Margin: Distinct, outline attached Exudate Amount: Medium Exudate Type: Serosanguineous Exudate Color: red, brown Foul Odor After Cleansing: No Slough/Fibrino Yes Wound Bed Granulation Amount: Large (67-100%) Exposed Structure Granulation Quality: Red, Pink Fascia Exposed: No Necrotic Amount: Small (1-33%) Fat Layer (Subcutaneous Tissue) Exposed: Yes Necrotic Quality: Adherent Slough Tendon Exposed: No Muscle Exposed: No Joint Exposed: No Bone Exposed: No Periwound Skin Texture Texture Color No Abnormalities Noted: Yes No Abnormalities Noted: Yes  Moisture Temperature / Pain No Abnormalities Noted: No Temperature: No Abnormality Dry / Scaly: No Maceration: No Treatment Notes Wound #2 (Calcaneus) Wound Laterality: Left, Distal Cleanser Soap and Water Discharge Instruction: May shower and wash wound with dial antibacterial soap and water prior to dressing change. Wound Cleanser Discharge Instruction: Cleanse the wound with wound cleanser prior to applying [name] clean dressing using gauze sponges, not tissue or cotton balls. Peri-Wound Care Triamcinolone 15 (g) Discharge Instruction: Use triamcinolone 15 (g) as directed Sween Lotion (Moisturizing lotion) Discharge Instruction: Apply moisturizing lotion as directed Topical Gentamicin Discharge Instruction: [place]. Mupirocin  Ointment Discharge Instruction: APPLY ONLY IN CLINIC. Primary Dressing Hydrofera Blue Ready Foam, 2.5 x2.5 in Discharge Instruction: [place]. [place] Discharge Instruction: applied by  secured with adaptic and steri-strips. Schomer, Milliani [name] ([id number]) [id number] Page 10 of 10 Secondary Dressing ABD Pad, 8x10 Discharge Instruction: Apply over primary dressing as directed. Woven Gauze Sponge, Non-Sterile 4x4 in Discharge Instruction: Apply over primary dressing as directed. Secured With Compression Wrap ThreePress (3 layer compression wrap) Discharge Instruction: Apply three layer compression as directed. Compression Stockings Add-Ons Electronic Signature(s) Signed: 08/04/2022 9:55:18 AM By: [name], [name] Entered By: [name] Pilling on 08/04/2022 08:15:56 -------------------------------------------------------------------------------- Vitals Details Patient Name: Date of Service: [name], [name]. 08/04/2022 8:00 [name] Medical Record Number: [id number] Patient Account Number: [id number] Date of Birth/Sex: Treating [name]: 03-25-1961 (61 y.o. [name], [name] Primary Care : [name] Other Clinician: Referring : Treating /Extender: [name] in Treatment: 21 Vital Signs Time Taken: 07:55 Temperature (F): 98.6 Height (in): 66 Pulse (bpm): 88 Weight (lbs): 339 Respiratory Rate (breaths/min): 20 Body Mass Index (BMI): 54.7 Blood Pressure (mmHg): 191/76 Reference Range: 80 - 120 mg / dl Notes per patient in pain and SNF did not provide pain medication. With pain 8/10 increases her BP. Electronic Signature(s) Signed: 08/04/2022 9:55:18 AM By: [name], [name] Entered By: [name] Pilling on 08/04/2022 08:11:49

## 2022-08-11 ENCOUNTER — Ambulatory Visit (HOSPITAL_BASED_OUTPATIENT_CLINIC_OR_DEPARTMENT_OTHER): Payer: 59 | Admitting: Internal Medicine

## 2022-08-14 ENCOUNTER — Encounter (HOSPITAL_BASED_OUTPATIENT_CLINIC_OR_DEPARTMENT_OTHER): Payer: 59 | Attending: Internal Medicine | Admitting: Internal Medicine

## 2022-08-14 DIAGNOSIS — I5032 Chronic diastolic (congestive) heart failure: Secondary | ICD-10-CM | POA: Diagnosis not present

## 2022-08-14 DIAGNOSIS — W2203XA Walked into furniture, initial encounter: Secondary | ICD-10-CM | POA: Insufficient documentation

## 2022-08-14 DIAGNOSIS — Z794 Long term (current) use of insulin: Secondary | ICD-10-CM | POA: Insufficient documentation

## 2022-08-14 DIAGNOSIS — L97522 Non-pressure chronic ulcer of other part of left foot with fat layer exposed: Secondary | ICD-10-CM | POA: Insufficient documentation

## 2022-08-14 DIAGNOSIS — J449 Chronic obstructive pulmonary disease, unspecified: Secondary | ICD-10-CM | POA: Diagnosis not present

## 2022-08-14 DIAGNOSIS — E1140 Type 2 diabetes mellitus with diabetic neuropathy, unspecified: Secondary | ICD-10-CM | POA: Diagnosis not present

## 2022-08-14 DIAGNOSIS — I110 Hypertensive heart disease with heart failure: Secondary | ICD-10-CM | POA: Diagnosis not present

## 2022-08-14 DIAGNOSIS — E11621 Type 2 diabetes mellitus with foot ulcer: Secondary | ICD-10-CM | POA: Insufficient documentation

## 2022-08-14 NOTE — Progress Notes (Signed)
Marchi, Zeynep C ([id number]) [id number].pdf Page 1 of 9 Visit Report for 08/14/2022 Chief Complaint Document Details Patient Name: Date of Service: [name] Bushy C. 08/14/2022 11:30 A M Medical Record Number: [id number] Patient Account Number: [id number] Date of Birth/Sex: Treating RN: November 28, 1960 (61 y.o. F) Primary Care Provider: [name] Other Clinician: Referring Provider: Treating Provider/Extender: [name] in Treatment: 23 Information Obtained from: Patient Chief Complaint 03/05/2022; left foot wound Electronic Signature(s) Signed: 08/14/2022 1:15:41 PM By: Geralyn Corwin DO Entered By: Geralyn Corwin on 08/14/2022 13:03:12 -------------------------------------------------------------------------------- Debridement Details Patient Name: Date of Service: [name] Bushy C. 08/14/2022 11:30 A M Medical Record Number: [id number] Patient Account Number: [id number] Date of Birth/Sex: Treating RN: [birth year] (61 y.o. [name], [name] Primary Care Provider: [name] Other Clinician: Referring Provider: Treating Provider/Extender: [name] in Treatment: 23 Debridement Performed for Assessment: Wound #2 Left,Distal Calcaneus Performed By: Physician Geralyn Corwin, DO Debridement Type: Debridement Severity of Tissue Pre Debridement: Fat layer exposed Level of Consciousness (Pre-procedure): Awake and Alert Pre-procedure Verification/Time Out Yes - 12:15 Taken: Start Time: 12:16 Pain Control: Lidocaine 5% topical ointment T Area Debrided (L x W): otal 2.3 (cm) x 3.5 (cm) = 8.05 (cm) Tissue and other material debrided: Viable, Non-Viable, Slough, Subcutaneous, Slough Level: Skin/Subcutaneous Tissue Debridement Description: Excisional Instrument: Curette Bleeding: Minimum Hemostasis Achieved: Pressure End Time: 12:22 Procedural Pain: 0 Post Procedural Pain: 0 Response  to Treatment: Procedure was tolerated well Level of Consciousness (Post- Awake and Alert procedure): Post Debridement Measurements of Total Wound Length: (cm) 2.3 Width: (cm) 3.5 Depth: (cm) 0.1 Volume: (cm) 0.632 Character of Wound/Ulcer Post Debridement: Improved Severity of Tissue Post Debridement: Fat layer exposed Holzhauer, Catherin C ([id number]) [id number].pdf Page 2 of 9 Post Procedure Diagnosis Same as Pre-procedure Electronic Signature(s) Signed: 08/14/2022 1:15:41 PM By: Geralyn Corwin DO Signed: 08/14/2022 2:17:30 PM By: Shawn Stall RN, BSN Entered By: Shawn Stall on 08/14/2022 12:22:23 -------------------------------------------------------------------------------- HPI Details Patient Name: Date of Service: [name], [name]. 08/14/2022 11:30 A M Medical Record Number: [id number] Patient Account Number: [id number] Date of Birth/Sex: Treating RN: [birth year] (61 y.o. F) Primary Care Provider: [name] Other Clinician: Referring Provider: Treating Provider/Extender: [name] in Treatment: 23 History of Present Illness HPI Description: Admission 03/05/2022 [name] is a 61 year old female with a past medical history of uncontrolled insulin-dependent type 2 diabetes, tobacco user and chronic diastolic heart failure that presents to the clinic for a 35-month history of wound to her left heel. She states she had a left ankle fusion in September 2022. She states that she always had a wound after the surgery and it never healed. She has home health and they have been doing compression wraps along with silver alginate to the wound bed. She currently denies signs of infection. 6/8; this is a 61 year old woman with type 2 diabetes. She developed a wound on her left Achilles heel just above the tip of the heel in the setting of recurrent ankle surgeries in late 2022. I have seen some of these results from either  the cast or the surgical boots that are put on after these operations. She thinks this may be the case. And a sense of pressure ulcer. In any case that she has been using Santyl Hydrofera Blue under compression. She is not wearing any footwear at home as she cannot find anything to accommodate the wrap 6/22; patient presents for follow-up. She has home health that comes out  once a week to help with dressing changes. She has no issues or complaints today. We have been using Hydrofera Blue under compression therapy. 7/6; patient presents for follow-up. She states that home health did not come out for the past 2 weeks. It is unclear why. We have been using Hydrofera Blue and Santyl under compression therapy. 7/13; patient presents for follow-up. She did not take the oral antibiotics prescribed at last clinic visit. We have been using Hydrofera Blue with gentamicin/mupirocin ointment under compression therapy. She has no issues or complaints today. She denies signs of infection. 7/20; patient presents for follow-up. We have been using Hydrofera Blue with antibiotic ointment under 3 layer compression. She has home health that change the dressing once. They put collagen on the wound bed. She also reports falling yesterday and hitting her right foot. She has no pain to the area today. 7/27; patient presents for follow-up. We have been using Hydrofera Blue under 3 layer compression. She has home health that changes the dressing. She has no issues or complaints today. 8/3; patient presents for follow-up. We continues to use Hydrofera Blue under 3 layer compression. She has no issues or complaints today. She has been approved for Epicord at 100%. 8/11; patient presents for follow-up. We have been using Hydrofera Blue under 3 layer compression. She has been approved for Epicord and we have this today. She is in agreement with having this applied. 8/18; patient presents for follow-up. Epicord #1 was placed in  standard fashion last clinic visit. She has no issues or complaints today. 9/18; Unfortunately patient has missed her last clinic appointments due to falling and breaking her right ankle. We have been following her for her left ankle wound. She has also developed a large blister to the left heel over the past week that has ruptured and dried. She currently resides In [facility]. Wound9/25; patient presents for follow-up. We have been using Hydrofera Blue and Santyl to the original left ankle wound and Xeroform to the dried blistered. We have been wrapping her with compression therapy. She resides in a facility and they changed the wrap once this past week. She has no issues or complaints today. She denies signs of infection. 10/3; patient presents for follow-up. We have been using Xeroform to the heel wound and Epicort to the left ankle wound All under compression therapy. She has no issues or complaints today. 10/10; patient presents for follow-up. We have been using Epicort to the left ankle wound and Santyl and Hydrofera Blue to the heel wound. All under compression therapy. she has no issues or complaints today. 10/16; patient presents for follow-up. Epicort was placed in standard fashion to the superior wound and onto the heel and Santyl and Hydrofera Blue. She states that the wrap got wet during her shower and her facility was able to rewrap with Kerlix/Coban. 10/23; patient presents for follow-up. Epicord was placed to the wound beds last clinic visit. We continue Kerlix/Coban. She has no issues or complaints today. 10/31; Patient presents for follow-up. Epicord was placed to the wound beds last clinic visit. The original wound is almost healed. We have done this under Kerlix/Coban. She denies signs of infection. Stolarz, Genelle C ([id number]) [id number].pdf Page 3 of 9 11/10; patient presents for follow-up. Patient's last epi cord was placed in standard fashion at  last clinic visit. The original wound has healed. She still has a heel wound. Grafix was approved however too costly for the patient. We will rerun epi cord  to see if she can have more applications. She had done very well with this. We have been using Hydrofera Blue to the heel under Kerlix/Coban. Electronic Signature(s) Signed: 08/14/2022 1:15:41 PM By: Kalman Shan DO Entered By: Kalman Shan on 08/14/2022 13:13:32 -------------------------------------------------------------------------------- Physical Exam Details Patient Name: Date of Service: [name] C. 08/14/2022 11:30 A M Medical Record Number: [id number] Patient Account Number: [id number] Date of Birth/Sex: Treating RN: [birth year] (62 y.o. F) Primary Care Provider: Jenean Lindau Other Clinician: Referring Provider: Treating Provider/Extender: Elise Benne in Treatment: 23 Constitutional respirations regular, non-labored and within target range for patient.. Cardiovascular 2+ dorsalis pedis/posterior tibialis pulses. Psychiatric pleasant and cooperative. Notes Left foot: T the posterior ankle there is epithelization to the previous wound site. The heel wound has granulation tissue and slough. No surrounding signs of o infection. 2+ pitting edema to the knee. Electronic Signature(s) Signed: 08/14/2022 1:15:41 PM By: Kalman Shan DO Entered By: Kalman Shan on 08/14/2022 13:14:11 -------------------------------------------------------------------------------- Physician Orders Details Patient Name: Date of Service: [name] C. 08/14/2022 11:30 A M Medical Record Number: [id number] Patient Account Number: [id number] Date of Birth/Sex: Treating RN: [birth year] (61 y.o. [name], [name] Primary Care Provider: Jenean Lindau Other Clinician: Referring Provider: Treating Provider/Extender: Elise Benne in Treatment: 23 Verbal / Phone  Orders: No Diagnosis Coding ICD-10 Coding Code Description [phone] Non-pressure chronic ulcer of other part of left foot with fat layer exposed E11.621 Type 2 diabetes mellitus with foot ulcer E66.01 Morbid (severe) obesity due to excess calories Follow-up Appointments ppointment in 2 weeks. - Tuesday Dr. Heber [place] (already scheduled) Return A Other: - facility to change twice a week next week. the following week once at end of week. Anesthetic Yoshino, Ilanna C ([id number]) [phone].pdf Page 4 of 9 (In clinic) Topical Lidocaine 5% applied to wound bed Cellular or Tissue Based Products Cellular or Tissue Based Product Type: - Run IVR for EpiFix and Epicord=100% covered 05/07/22: Epicord ordered daptic or Mepitel. (DO NOT REMOVE). - Cellular or Tissue Based Product applied to wound bed, secured with steri-strips, cover with A Epicord #1 05/15/25 Epicord #2 05/22/22 Epicord #3 06/29/2022 Epicord #4 07/07/2022 Epifix #5 07/14/22 Epicord #6 07/20/22 Epicord # 7 07/27/22 ***Run for Grafix***PENDING Epicord #8 08/04/2022 Bathing/ Shower/ Hygiene May shower with protection but do not get wound dressing(s) wet. - May use cast protector bag from [facility], [facility] or [employer] Edema Control - Lymphedema / SCD / Other Elevate legs to the level of the heart or above for 30 minutes daily and/or when sitting, a frequency of: Avoid standing for long periods of time. Off-Loading Prevalon Boot - use while in bed and chair. Additional Orders / Instructions Follow Nutritious Diet - Monitor/Control Blood Sugar Wound Treatment Wound #2 - Calcaneus Wound Laterality: Left, Distal Cleanser: Soap and Water 2 x Per Week/30 Days Discharge Instructions: May shower and wash wound with dial antibacterial soap and water prior to dressing change. Cleanser: Wound Cleanser 2 x Per Week/30 Days Discharge Instructions: Cleanse the wound with wound cleanser prior to applying a clean dressing using  gauze sponges, not tissue or cotton balls. Peri-Wound Care: Sween Lotion (Moisturizing lotion) 2 x Per Week/30 Days Discharge Instructions: Apply moisturizing lotion as directed Prim Dressing: Hydrofera Blue Ready Foam, 2.5 x2.5 in 2 x Per Week/30 Days ary Discharge Instructions: apply over the santyl. Prim Dressing: Santyl Ointment 2 x Per Week/30 Days ary Discharge Instructions: Apply under the hydrofera blue. Secondary Dressing: ABD Pad, 8x10 2  x Per Week/30 Days Discharge Instructions: Apply over primary dressing as directed. Secondary Dressing: Woven Gauze Sponge, Non-Sterile 4x4 in (Home Health) 2 x Per Week/30 Days Discharge Instructions: Apply over primary dressing as directed. Compression Wrap: ThreePress (3 layer compression wrap) (Home Health) 2 x Per Week/30 Days Discharge Instructions: Apply three layer compression as directed. Electronic Signature(s) Signed: 08/14/2022 1:15:41 PM By: Kalman Shan DO Entered By: Kalman Shan on 08/14/2022 13:14:19 -------------------------------------------------------------------------------- Problem List Details Patient Name: Date of Service: [name] C. 08/14/2022 11:30 A M Medical Record Number: [id number] Patient Account Number: [id number] Date of Birth/Sex: Treating RN: 09-04-1961 (61 y.o. [name], [name] Primary Care Provider: Jenean Lindau Other Clinician: Referring Provider: Treating Provider/Extender: Elise Benne in Treatment: [address], [place] C ([id number]) [id number] Page 5 of 9 Active Problems ICD-10 Encounter Code Description Active Date MDM Diagnosis L97.522 Non-pressure chronic ulcer of other part of left foot with fat layer exposed 03/05/2022 No Yes E11.621 Type 2 diabetes mellitus with foot ulcer 03/05/2022 No Yes E66.01 Morbid (severe) obesity due to excess calories 03/05/2022 No Yes Inactive Problems Resolved Problems Electronic  Signature(s) Signed: 08/14/2022 1:15:41 PM By: Kalman Shan DO Entered By: Kalman Shan on 08/14/2022 12:55:12 -------------------------------------------------------------------------------- Progress Note Details Patient Name: Date of Service: [name] C. 08/14/2022 11:30 A M Medical Record Number: [id number] Patient Account Number: [id number] Date of Birth/Sex: Treating RN: 14-Feb-1961 (61 y.o. F) Primary Care Provider: Jenean Lindau Other Clinician: Referring Provider: Treating Provider/Extender: Elise Benne in Treatment: 23 Subjective Chief Complaint Information obtained from Patient 03/05/2022; left foot wound History of Present Illness (HPI) Admission 03/05/2022 Ms. Edwinna Westrich is a 61 year old female with a past medical history of uncontrolled insulin-dependent type 2 diabetes, tobacco user and chronic diastolic heart failure that presents to the clinic for a 66-month history of wound to her left heel. She states she had a left ankle fusion in September 2022. She states that she always had a wound after the surgery and it never healed. She has home health and they have been doing compression wraps along with silver alginate to the wound bed. She currently denies signs of infection. 6/8; this is a 61 year old woman with type 2 diabetes. She developed a wound on her left Achilles heel just above the tip of the heel in the setting of recurrent ankle surgeries in late 2022. I have seen some of these results from either the cast or the surgical boots that are put on after these operations. She thinks this may be the case. And a sense of pressure ulcer. In any case that she has been using Santyl Hydrofera Blue under compression. She is not wearing any footwear at home as she cannot find anything to accommodate the wrap 6/22; patient presents for follow-up. She has home health that comes out once a week to help with dressing changes. She has no  issues or complaints today. We have been using Hydrofera Blue under compression therapy. 7/6; patient presents for follow-up. She states that home health did not come out for the past 2 weeks. It is unclear why. We have been using Hydrofera Blue and Santyl under compression therapy. 7/13; patient presents for follow-up. She did not take the oral antibiotics prescribed at last clinic visit. We have been using Hydrofera Blue with gentamicin/mupirocin ointment under compression therapy. She has no issues or complaints today. She denies signs of infection. 7/20; patient presents for follow-up. We have been using Hydrofera Blue with antibiotic ointment  under 3 layer compression. She has home health that change the dressing once. They put collagen on the wound bed. She also reports falling yesterday and hitting her right foot. She has no pain to the area today. 7/27; patient presents for follow-up. We have been using Hydrofera Blue under 3 layer compression. She has home health that changes the dressing. She has no issues or complaints today. 8/3; patient presents for follow-up. We continues to use Hydrofera Blue under 3 layer compression. She has no issues or complaints today. She has been approved for Epicord at 100%. Mcmath, Faron C ([id number]) [id number].pdf Page 6 of 9 8/11; patient presents for follow-up. We have been using Hydrofera Blue under 3 layer compression. She has been approved for Epicord and we have this today. She is in agreement with having this applied. 8/18; patient presents for follow-up. Epicord #1 was placed in standard fashion last clinic visit. She has no issues or complaints today. 9/18; Unfortunately patient has missed her last clinic appointments due to falling and breaking her right ankle. We have been following her for her left ankle wound. She has also developed a large blister to the left heel over the past week that has ruptured and dried. She  currently resides In [facility]. Wound9/25; patient presents for follow-up. We have been using Hydrofera Blue and Santyl to the original left ankle wound and Xeroform to the dried blistered. We have been wrapping her with compression therapy. She resides in a facility and they changed the wrap once this past week. She has no issues or complaints today. She denies signs of infection. 10/3; patient presents for follow-up. We have been using Xeroform to the heel wound and Epicort to the left ankle wound All under compression therapy. She has no issues or complaints today. 10/10; patient presents for follow-up. We have been using Epicort to the left ankle wound and Santyl and Hydrofera Blue to the heel wound. All under compression therapy. she has no issues or complaints today. 10/16; patient presents for follow-up. Epicort was placed in standard fashion to the superior wound and onto the heel and Santyl and Hydrofera Blue. She states that the wrap got wet during her shower and her facility was able to rewrap with Kerlix/Coban. 10/23; patient presents for follow-up. Epicord was placed to the wound beds last clinic visit. We continue Kerlix/Coban. She has no issues or complaints today. 10/31; Patient presents for follow-up. Epicord was placed to the wound beds last clinic visit. The original wound is almost healed. We have done this under Kerlix/Coban. She denies signs of infection. 11/10; patient presents for follow-up. Patient's last epi cord was placed in standard fashion at last clinic visit. The original wound has healed. She still has a heel wound. Grafix was approved however too costly for the patient. We will rerun epi cord to see if she can have more applications. She had done very well with this. We have been using Hydrofera Blue to the heel under Kerlix/Coban. Patient History Information obtained from Patient, Chart. Family History Cancer - Father,Mother, Diabetes - Mother,Maternal  Grandparents,Paternal Grandparents, Heart Disease - Siblings,Maternal Grandparents, Hypertension - Siblings, Tuberculosis - Maternal Grandparents,Paternal Grandparents. Social History Current every day smoker, Marital Status - Single, Alcohol Use - Never, Drug Use - No History, Caffeine Use - Daily. Medical History Hematologic/Lymphatic Patient has history of Anemia Respiratory Patient has history of Chronic Obstructive Pulmonary Disease (COPD) Cardiovascular Patient has history of Congestive Heart Failure - Weighs self daily, Hypertension, Myocardial Infarction, Peripheral  Venous Disease Endocrine Patient has history of Type II Diabetes Neurologic Patient has history of Neuropathy Medical A Surgical History Notes nd Gastrointestinal GERD, Peptic Ulcer Disease Musculoskeletal Broke left leg 3 years ago, 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 Vitals Time Taken: 11:47 AM, Height: 66 in, Weight: 339 lbs, BMI: 54.7, Temperature: 98.7 F, Pulse: 114 bpm, Respiratory Rate: 17 breaths/min, Blood Pressure: 162/78 mmHg. Cardiovascular 2+ dorsalis pedis/posterior tibialis pulses. Psychiatric pleasant and cooperative. General Notes: Left foot: T the posterior ankle there is epithelization to the previous wound site. The heel wound has granulation tissue and slough. No o surrounding signs of infection. 2+ pitting edema to the knee. Integumentary (Hair, Skin) Nestle, Azyiah C ([id number]) [phone].pdf Page 7 of 9 Wound #1 status is Healed - Epithelialized. Original cause of wound was Other Lesion. The date acquired was: 10/06/2021. The wound has been in treatment 23 weeks. The wound is located on the Left Calcaneus. The wound measures 0cm length x 0cm width x 0cm depth; 0cm^2 area and 0cm^3 volume. There is no tunneling or undermining noted. There is a  small amount of drainage noted. The wound margin is distinct with the outline attached to the wound base. There is large (67-100%) red granulation within the wound bed. There is no necrotic tissue within the wound bed. The periwound skin appearance had no abnormalities noted for color. The periwound skin appearance exhibited: Callus. The periwound skin appearance did not exhibit: Crepitus, Excoriation, Induration, Rash, Scarring, Dry/Scaly, Maceration. Periwound temperature was noted as No Abnormality. Wound #2 status is Open. Original cause of wound was Blister. The date acquired was: 06/15/2022. The wound has been in treatment 6 weeks. The wound is located on the Left,Distal Calcaneus. The wound measures 2.3cm length x 3.5cm width x 0.1cm depth; 6.322cm^2 area and 0.632cm^3 volume. There is Fat Layer (Subcutaneous Tissue) exposed. There is no tunneling or undermining noted. There is a medium amount of serosanguineous drainage noted. The wound margin is distinct with the outline attached to the wound base. There is large (67-100%) red, pink granulation within the wound bed. There is a small (1-33%) amount of necrotic tissue within the wound bed including Adherent Slough. The periwound skin appearance had no abnormalities noted for texture. The periwound skin appearance had no abnormalities noted for color. The periwound skin appearance did not exhibit: Dry/Scaly, Maceration. Periwound temperature was noted as No Abnormality. Assessment Active Problems ICD-10 Non-pressure chronic ulcer of other part of left foot with fat layer exposed Type 2 diabetes mellitus with foot ulcer Morbid (severe) obesity due to excess calories Patient's original wound has healed. I debrided nonviable tissue to the heel wound and recommended Santyl and Hydrofera Blue under 3 layer compression. We will see if we can extend applications of Epicort to this area. Follow-up in 2 weeks. Procedures Wound #2 Pre-procedure  diagnosis of Wound #2 is a Diabetic Wound/Ulcer of the Lower Extremity located on the Left,Distal Calcaneus .Severity of Tissue Pre Debridement is: Fat layer exposed. There was a Excisional Skin/Subcutaneous Tissue Debridement with a total area of 8.05 sq cm performed by Kalman Shan, DO. With the following instrument(s): Curette to remove Viable and Non-Viable tissue/material. Material removed includes Subcutaneous Tissue and Slough and after achieving pain control using Lidocaine 5% topical ointment. A time out was conducted at 12:15, prior to the start of the procedure. A Minimum amount of bleeding was controlled with Pressure. The procedure was tolerated well with a  pain level of 0 throughout and a pain level of 0 following the procedure. Post Debridement Measurements: 2.3cm length x 3.5cm width x 0.1cm depth; 0.632cm^3 volume. Character of Wound/Ulcer Post Debridement is improved. Severity of Tissue Post Debridement is: Fat layer exposed. Post procedure Diagnosis Wound #2: Same as Pre-Procedure Plan Follow-up Appointments: Return Appointment in 2 weeks. - Tuesday Dr. Heber [place] (already scheduled) Other: - facility to change twice a week next week. the following week once at end of week. Anesthetic: (In clinic) Topical Lidocaine 5% applied to wound bed Cellular or Tissue Based Products: Cellular or Tissue Based Product Type: - Run IVR for EpiFix and Epicord=100% covered 05/07/22: Epicord ordered Cellular or Tissue Based Product applied to wound bed, secured with steri-strips, cover with Adaptic or Mepitel. (DO NOT REMOVE). - Epicord #1 05/15/25 Epicord #2 05/22/22 Epicord #3 06/29/2022 Epicord #4 07/07/2022 Epifix #5 07/14/22 Epicord #6 07/20/22 Epicord # 7 07/27/22 ***Run for Grafix***PENDING Epicord #8 08/04/2022 Bathing/ Shower/ Hygiene: May shower with protection but do not get wound dressing(s) wet. - May use cast protector bag from [facility], [facility] or [employer] Edema Control - Lymphedema / SCD  / Other: Elevate legs to the level of the heart or above for 30 minutes daily and/or when sitting, a frequency of: Avoid standing for long periods of time. Off-Loading: Prevalon Boot - use while in bed and chair. Additional Orders / Instructions: Follow Nutritious Diet - Monitor/Control Blood Sugar WOUND #2: - Calcaneus Wound Laterality: Left, Distal Cleanser: Soap and Water 2 x Per Week/30 Days Discharge Instructions: May shower and wash wound with dial antibacterial soap and water prior to dressing change. Cleanser: Wound Cleanser 2 x Per Week/30 Days Discharge Instructions: Cleanse the wound with wound cleanser prior to applying a clean dressing using gauze sponges, not tissue or cotton balls. Peri-Wound Care: Sween Lotion (Moisturizing lotion) 2 x Per Week/30 Days Discharge Instructions: Apply moisturizing lotion as directed Prim Dressing: Hydrofera Blue Ready Foam, 2.5 x2.5 in 2 x Per Week/30 Days ary Discharge Instructions: apply over the santyl. Prim Dressing: Santyl Ointment 2 x Per Week/30 Days ary Discharge Instructions: Apply under the hydrofera blue. Secondary Dressing: ABD Pad, 8x10 2 x Per Week/30 Days Discharge Instructions: Apply over primary dressing as directed. Secondary Dressing: Woven Gauze Sponge, Non-Sterile 4x4 in [facility]) 2 x Per Week/30 Days Celaya, Laniece C ([id number]) [phone].pdf Page 8 of 9 Discharge Instructions: Apply over primary dressing as directed. Compression Wrap: ThreePress (3 layer compression wrap) (Home Health) 2 x Per Week/30 Days Discharge Instructions: Apply three layer compression as directed. 1. In office sharp debridement 2. Santyl and Hydrofera Blue under 3 layer compression to the left lower extremity 3. Run IVR for Epicort 4. Follow-up in 2 weeks Electronic Signature(s) Signed: 08/14/2022 1:15:41 PM By: Kalman Shan DO Entered By: Kalman Shan on 08/14/2022  13:15:11 -------------------------------------------------------------------------------- HxROS Details Patient Name: Date of Service: [name] C. 08/14/2022 11:30 A M Medical Record Number: [id number] Patient Account Number: [id number] Date of Birth/Sex: Treating RN: 07/07/1961 (61 y.o. F) Primary Care Provider: Jenean Lindau Other Clinician: Referring Provider: Treating Provider/Extender: Elise Benne in Treatment: 23 Information Obtained From Patient Chart Hematologic/Lymphatic Medical History: Positive for: Anemia Respiratory Medical History: Positive for: Chronic Obstructive Pulmonary Disease (COPD) Cardiovascular Medical History: Positive for: Congestive Heart Failure - Weighs self daily; Hypertension; Myocardial Infarction; Peripheral Venous Disease Gastrointestinal Medical History: Past Medical History Notes: GERD, Peptic Ulcer Disease Endocrine Medical History: Positive for: Type II Diabetes Time with diabetes: 12  years Treated with: Insulin, Oral agents Blood sugar tested every day: Yes Tested : Twice daily Musculoskeletal Medical History: Past Medical History Notes: Broke left leg 3 years ago, 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, foot fused Neurologic Medical History: Positive for: Neuropathy Psychiatric Hunnell, Candice C ([id number].pdf Page 9 of 9 Medical History: Past Medical History Notes: Depression Immunizations Pneumococcal Vaccine: Received Pneumococcal Vaccination: No Implantable Devices None Family and Social History Cancer: Yes - Father,Mother; Diabetes: Yes - Mother,Maternal Grandparents,Paternal Grandparents; Heart Disease: Yes - Siblings,Maternal Grandparents; Hypertension: Yes - Siblings; Tuberculosis: Yes - Maternal Grandparents,Paternal Grandparents; Current every day smoker; Marital Status - Single; Alcohol Use: Never; Drug Use: No History; Caffeine Use: Daily; Financial  Concerns: No; Food, Clothing or Shelter Needs: No; Support System Lacking: No; Transportation Concerns: No Electronic Signature(s) Signed: 08/14/2022 1:15:41 PM By: Kalman Shan DO Entered By: Kalman Shan on 08/14/2022 13:13:37 -------------------------------------------------------------------------------- SuperBill Details Patient Name: Date of Service: [name] C. 08/14/2022 Medical Record Number: [id number] Patient Account Number: [id number] Date of Birth/Sex: Treating RN: Sep 28, 1961 (61 y.o. [name], [name] Primary Care Provider: Jenean Lindau Other Clinician: Referring Provider: Treating Provider/Extender: Elise Benne in Treatment: 23 Diagnosis Coding ICD-10 Codes Code Description [phone] Non-pressure chronic ulcer of other part of left foot with fat layer exposed E11.621 Type 2 diabetes mellitus with foot ulcer E66.01 Morbid (severe) obesity due to excess calories Facility Procedures : CPT4 Code: [id number] Description: [id number] - DEB SUBQ TISSUE 20 SQ CM/< ICD-10 Diagnosis Description L97.522 Non-pressure chronic ulcer of other part of left foot with fat layer exposed E11.621 Type 2 diabetes mellitus with foot ulcer Modifier: Quantity: 1 Physician Procedures : CPT4 Code Description Modifier [id number] - WC PHYS SUBQ TISS 20 SQ CM ICD-10 Diagnosis Description L97.522 Non-pressure chronic ulcer of other part of left foot with fat layer exposed E11.621 Type 2 diabetes mellitus with foot ulcer Quantity: 1 Electronic Signature(s) Signed: 08/14/2022 1:15:41 PM By: Kalman Shan DO Entered By: Kalman Shan on 08/14/2022 13:15:23

## 2022-08-17 NOTE — Progress Notes (Signed)
[name], [name] ([id number]) [id number].pdf Page 1 of 10 Visit Report for 08/14/2022 Arrival Information Details Patient Name: Date of Service: [name] 08/14/2022 11:30 A M Medical Record Number: [id number] Patient Account Number: [id number] Date of Birth/Sex: Treating RN: 06-Feb-1961 (61 y.o. [name], [name] Primary Care Halcyon Heck: Jenean Lindau Other Clinician: Referring Alf Doyle: Treating Candido Flott/Extender: Elise Benne in Treatment: 23 Visit Information History Since Last Visit Added or deleted any medications: No Patient Arrived: Wheel Chair Any new allergies or adverse reactions: No Arrival Time: 11:47 Had a fall or experienced change in No Accompanied By: self activities of daily living that may affect Transfer Assistance: None risk of falls: Patient Identification Verified: Yes Signs or symptoms of abuse/neglect since last visito No Secondary Verification Process Completed: Yes Hospitalized since last visit: No Patient Requires Transmission-Based Precautions: No Implantable device outside of the clinic excluding No Patient Has Alerts: No cellular tissue based products placed in the center since last visit: Has Dressing in Place as Prescribed: Yes Pain Present Now: No Electronic Signature(s) Signed: 08/17/2022 4:10:29 PM By: Rhae Hammock RN Entered By: Rhae Hammock on 08/14/2022 11:47:51 -------------------------------------------------------------------------------- Encounter Discharge Information Details Patient Name: Date of Service: [name] Lax C. 08/14/2022 11:30 A M Medical Record Number: [id number] Patient Account Number: [id number] Date of Birth/Sex: Treating RN: 1961/03/27 (61 y.o. [name] Primary Care Ayelet Gruenewald: Jenean Lindau Other Clinician: Referring Chundra Sauerwein: Treating Trinidy Masterson/Extender: Elise Benne in Treatment: 23 Encounter  Discharge Information Items Post Procedure Vitals Discharge Condition: Stable Temperature (F): 98.7 Ambulatory Status: Wheelchair Pulse (bpm): 114 Discharge Destination: [place] Respiratory Rate (breaths/min): 20 Telephoned: No Blood Pressure (mmHg): 162/78 Orders Sent: Yes Transportation: Private Auto Accompanied By: self Schedule Follow-up Appointment: Yes Clinical Summary of Care: Electronic Signature(s) Signed: 08/14/2022 2:17:30 PM By: Deon Pilling RN, BSN Entered By: Deon Pilling on 08/14/2022 12:58:02 Helton, Esmee C ([id number]) [id number] Page 2 of 10 -------------------------------------------------------------------------------- Lower Extremity Assessment Details Patient Name: Date of Service: [name] Lax C. 08/14/2022 11:30 A M Medical Record Number: [id number] Patient Account Number: [id number] Date of Birth/Sex: Treating RN: 1961/04/13 (61 y.o. [name], [name] Primary Care Marquette Blodgett: Jenean Lindau Other Clinician: Referring Emalie Mcwethy: Treating Slate Debroux/Extender: Elise Benne in Treatment: 23 Edema Assessment Assessed: Shirlyn Goltz: Yes] Patrice Paradise: No] Edema: [Left: Ye] [Right: s] Calf Left: Right: Point of Measurement: 28 cm From Medial Instep 49 cm Ankle Left: Right: Point of Measurement: 3 cm From Medial Instep 29 cm Vascular Assessment Pulses: Dorsalis Pedis Palpable: [Left:Yes] Posterior Tibial Palpable: [Left:Yes] Electronic Signature(s) Signed: 08/17/2022 4:10:29 PM By: Rhae Hammock RN Entered By: Rhae Hammock on 08/14/2022 11:49:17 -------------------------------------------------------------------------------- Multi Wound Chart Details Patient Name: Date of Service: [name] Lax C. 08/14/2022 11:30 A M Medical Record Number: [id number] Patient Account Number: [id number] Date of Birth/Sex: Treating RN: [birth year] (61 y.o. F) Primary Care Mabry Santarelli: Jenean Lindau Other Clinician: Referring Bernadean Saling: Treating Harith Mccadden/Extender: Elise Benne in Treatment: 23 Vital Signs Height(in): 66 Pulse(bpm): 114 Weight(lbs): 339 Blood Pressure(mmHg): 162/78 Body Mass Index(BMI): 54.7 Temperature(F): 98.7 Respiratory Rate(breaths/min): 17 [1:Photos:] [N/A:N/A] Nease, [name] ([id number]) [1:Left Calcaneus Wound Location: Other Lesion Wounding Event: Diabetic Wound/Ulcer of the Lower Primary Etiology: Extremity Anemia, Chronic Obstructive Comorbid History: Pulmonary Disease (COPD), Congestive Heart Failure, Hypertension, Myocardial  Infarction, Peripheral Venous Disease, Type II Diabetes, Neuropathy 10/06/2021 Date Acquired: 23 Weeks of Treatment: Healed - Epithelialized Wound Status: No Wound Recurrence: 0x0x0 Measurements L x W x D (cm) 0  A (cm) : rea 0 Volume (cm) : 100.00% %  Reduction in A rea: 100.00% % Reduction in Volume: Grade 2 Classification: Small Exudate A mount: N/A Exudate Type: N/A Exudate Color: Distinct, outline attached Wound Margin: Large (67-100%) Granulation A mount: Red Granulation Quality: None Present  (0%) Necrotic A mount: Fascia: No Exposed Structures: Fat Layer (Subcutaneous Tissue): No Tendon: No Muscle: No Joint: No Bone: No Large (67-100%) Epithelialization: N/A Debridement: Pre-procedure Verification/Time Out N/A Taken: N/A Pain Control: N/A  Tissue Debrided: N/A Level: N/A Debridement A (sq cm): rea N/A Instrument: N/A Bleeding: N/A Hemostasis A chieved: N/A Procedural Pain: N/A Post Procedural Pain: N/A Debridement Treatment Response: N/A Post Debridement Measurements L x W x D (cm) N/A  Post Debridement Volume: (cm) Callus: Yes Periwound Skin Texture: Excoriation: No Induration: No Crepitus: No Rash: No Scarring: No Maceration: No Periwound Skin Moisture: Dry/Scaly: No No Abnormalities Noted Periwound Skin Color: No Abnormality  Temperature: N/A Procedures Performed:] [2:Left, Distal Calcaneus  Blister Diabetic Wound/Ulcer of the Lower Extremity Anemia, Chronic Obstructive Pulmonary Disease (COPD), Congestive Heart Failure, Hypertension, Myocardial Infarction, Peripheral Venous  Disease, Type II Diabetes, Neuropathy 06/15/2022 6 Open No 2.3x3.5x0.1 6.322 0.632 55.30% 55.30% Grade 2 Medium Serosanguineous red, brown Distinct, outline attached Large (67-100%) Red, Pink Small (1-33%) Fat Layer (Subcutaneous Tissue): Yes N/A Fascia:  No Tendon: No Muscle: No Joint: No Bone: No Small (1-33%) Debridement - Excisional 12:15 Lidocaine 5% topical ointment Subcutaneous, Slough Skin/Subcutaneous Tissue 8.05 Curette Minimum Pressure 0 0 Procedure was tolerated well 2.3x3.5x0.1 0.632 No  Abnormalities Noted Maceration: No Dry/Scaly: No No Abnormalities Noted No Abnormality Debridement] [[id number] Page 3 of 10 N/A N/A N/A N/A N/A N/A N/A N/A N/A N/A N/A N/A N/A N/A N/A N/A N/A N/A N/A N/A N/A N/A N/A N/A  N/A N/A N/A N/A N/A N/A N/A N/A N/A N/A N/A N/A N/A N/A N/A N/A N/A] Treatment Notes Wound #1 (Calcaneus) Wound Laterality: Left Cleanser Peri-Wound Care Topical Primary Dressing Secondary Dressing Secured With Compression Wrap Compression Stockings Add-Ons Wound #2 (Calcaneus) Wound Laterality: Left, Distal Palazzi, Ania C ([id number]) [id number] Page 4 of 10 Cleanser Soap and Water Discharge Instruction: May shower and wash wound with dial antibacterial soap and water prior to dressing change. Wound Cleanser Discharge Instruction: Cleanse the wound with wound cleanser prior to applying a clean dressing using gauze sponges, not tissue or cotton balls. Peri-Wound Care Sween Lotion (Moisturizing lotion) Discharge Instruction: Apply moisturizing lotion as directed Topical Primary Dressing Hydrofera Blue Ready Foam, 2.5 x2.5 in Discharge Instruction: apply over the santyl. Santyl Ointment Discharge Instruction: Apply under the hydrofera  blue. Secondary Dressing ABD Pad, 8x10 Discharge Instruction: Apply over primary dressing as directed. Woven Gauze Sponge, Non-Sterile 4x4 in Discharge Instruction: Apply over primary dressing as directed. Secured With Compression Wrap ThreePress (3 layer compression wrap) Discharge Instruction: Apply three layer compression as directed. Compression Stockings Add-Ons Electronic Signature(s) Signed: 08/14/2022 1:15:41 PM By: Kalman Shan DO Entered By: Kalman Shan on 08/14/2022 13:02:16 -------------------------------------------------------------------------------- Multi-Disciplinary Care Plan Details Patient Name: Date of Service: [name] Lax C. 08/14/2022 11:30 A M Medical Record Number: [id number] Patient Account Number: [id number] Date of Birth/Sex: Treating RN: [birth year] (61 y.o. [name] Primary Care Elsey Holts: Jenean Lindau Other Clinician: Referring Alf Doyle: Treating Gabrella Stroh/Extender: Elise Benne in Treatment: 23 Active Inactive Abuse / Safety / Falls / Self Care Management Nursing Diagnoses: History of Falls Goals: Patient will not experience any injury related to falls Date Initiated: 03/05/2022 Target Resolution Date:  11/06/2022 Goal Status: Active Interventions: Assess fall risk on admission and as needed Assess: immobility, friction, shearing, incontinence upon admission and as needed Assess impairment of mobility on admission and as needed per policy Rail, Ladashia C ([id number]) [id number].pdf Page 5 of 10 Notes: 04/30/22: Fall prevention ongoing, recent fall. Wound/Skin Impairment Nursing Diagnoses: Impaired tissue integrity Goals: Patient/caregiver will verbalize understanding of skin care regimen Date Initiated: 03/05/2022 Target Resolution Date: 11/06/2022 Goal Status: Active Ulcer/skin breakdown will have a volume reduction of 30% by week 4 Date Initiated: 03/05/2022 Date  Inactivated: 04/30/2022 Target Resolution Date: 05/02/2022 Goal Status: Met Ulcer/skin breakdown will have a volume reduction of 50% by week 8 Date Initiated: 04/30/2022 Date Inactivated: 07/07/2022 Target Resolution Date: 07/04/2022 Unmet Reason: patient sent three Goal Status: Unmet weeks inpatient and rehab before returning to wound center. Interventions: Assess patient/caregiver ability to obtain necessary supplies Assess patient/caregiver ability to perform ulcer/skin care regimen upon admission and as needed Assess ulceration(s) every visit Provide education on smoking Provide education on ulcer and skin care Treatment Activities: Topical wound management initiated : 03/05/2022 Notes: 04/30/22: Wound care regimen continues. Electronic Signature(s) Signed: 08/14/2022 2:17:30 PM By: Deon Pilling RN, BSN Entered By: Deon Pilling on 08/14/2022 12:55:30 -------------------------------------------------------------------------------- Pain Assessment Details Patient Name: Date of Service: [name] 08/14/2022 11:30 A M Medical Record Number: [id number] Patient Account Number: [id number] Date of Birth/Sex: Treating RN: Jun 14, 1961 (61 y.o. [name], [name] Primary Care Shye Doty: Jenean Lindau Other Clinician: Referring Raeqwon Lux: Treating Jordyn Doane/Extender: Elise Benne in Treatment: 23 Active Problems Location of Pain Severity and Description of Pain Patient Has Paino Yes Site Locations Pain Location: Mcclees, Irmgard C ([id number]) [phone].pdf Page 6 of 10 Pain Location: Generalized Pain, Pain in Ulcers With Dressing Change: Yes Duration of the Pain. Constant / Intermittento Intermittent Rate the pain. Current Pain Level: 5 Worst Pain Level: 10 Least Pain Level: 0 Tolerable Pain Level: 5 Character of Pain Describe the Pain: Aching Pain Management and Medication Current Pain Management: Medication: No Cold  Application: No Rest: No Massage: No Activity: No T.E.N.S.: No Heat Application: No Leg drop or elevation: No Is the Current Pain Management Adequate: Adequate How does your wound impact your activities of daily livingo Sleep: No Bathing: No Appetite: No Relationship With Others: No Bladder Continence: No Emotions: No Bowel Continence: No Work: No Toileting: No Drive: No Dressing: No Hobbies: No Electronic Signature(s) Signed: 08/17/2022 4:10:29 PM By: Rhae Hammock RN Entered By: Rhae Hammock on 08/14/2022 11:48:59 -------------------------------------------------------------------------------- Patient/Caregiver Education Details Patient Name: Date of Service: [name]. 11/10/2023andnbsp11:30 A M Medical Record Number: [id number] Patient Account Number: [id number] Date of Birth/Gender: Treating RN: 23-Mar-1961 (61 y.o. [name] Primary Care Physician: Jenean Lindau Other Clinician: Referring Physician: Treating Physician/Extender: Elise Benne in Treatment: 23 Education Assessment Education Provided To: Patient Education Topics Provided Wound/Skin Impairment: Handouts: Skin Care Do's and Dont's Methods: Explain/Verbal Responses: Reinforcements needed Electronic Signature(s) Signed: 08/14/2022 2:17:30 PM By: Deon Pilling RN, BSN Kolasinski, Seylah C ([id number]) [phone].pdf Page 7 of 10 Signed: 08/14/2022 2:17:30 PM By: Deon Pilling RN, BSN Entered By: Deon Pilling on 08/14/2022 12:56:24 -------------------------------------------------------------------------------- Wound Assessment Details Patient Name: Date of Service: [name] 08/14/2022 11:30 A M Medical Record Number: [id number] Patient Account Number: [id number] Date of Birth/Sex: Treating RN: 27-Apr-1961 (61 y.o. [name], [name] Primary Care Noble Cicalese: Jenean Lindau Other Clinician: Referring Mathews Stuhr: Treating  Lezlie Ritchey/Extender: Elise Benne in Treatment: 23 Wound Status  Wound Number: 1 Primary Diabetic Wound/Ulcer of the Lower Extremity Etiology: Wound Location: Left Calcaneus Wound Healed - Epithelialized Wounding Event: Other Lesion Status: Date Acquired: 10/06/2021 Comorbid Anemia, Chronic Obstructive Pulmonary Disease (COPD), Weeks Of Treatment: 23 History: Congestive Heart Failure, Hypertension, Myocardial Infarction, Clustered Wound: No Peripheral Venous Disease, Type II Diabetes, Neuropathy Photos Wound Measurements Length: (cm) Width: (cm) Depth: (cm) Area: (cm) Volume: (cm) 0 % Reduction in Area: 100% 0 % Reduction in Volume: 100% 0 Epithelialization: Large (67-100%) 0 Tunneling: No 0 Undermining: No Wound Description Classification: Grade 2 Wound Margin: Distinct, outline attached Exudate Amount: Small Foul Odor After Cleansing: No Slough/Fibrino No Wound Bed Granulation Amount: Large (67-100%) Exposed Structure Granulation Quality: Red Fascia Exposed: No Necrotic Amount: None Present (0%) Fat Layer (Subcutaneous Tissue) Exposed: No Tendon Exposed: No Muscle Exposed: No Joint Exposed: No Bone Exposed: No Periwound Skin Texture Texture Color No Abnormalities Noted: No No Abnormalities Noted: Yes Callus: Yes Temperature / Pain Crepitus: No Temperature: No Abnormality Excoriation: No Induration: No Rash: No Scarring: No Moisture No Abnormalities Noted: No Mulka, Tyyonna C ([id number]) [phone].pdf Page 8 of 10 Dry / Scaly: No Maceration: No Treatment Notes Wound #1 (Calcaneus) Wound Laterality: Left Cleanser Peri-Wound Care Topical Primary Dressing Secondary Dressing Secured With Compression Wrap Compression Stockings Add-Ons Electronic Signature(s) Signed: 08/14/2022 2:17:30 PM By: Deon Pilling RN, BSN Entered By: Deon Pilling on 08/14/2022  12:20:59 -------------------------------------------------------------------------------- Wound Assessment Details Patient Name: Date of Service: [name] Lax C. 08/14/2022 11:30 A M Medical Record Number: [id number] Patient Account Number: [id number] Date of Birth/Sex: Treating RN: [birth year] (61 y.o. [name], [name] Primary Care Carrell Palmatier: Jenean Lindau Other Clinician: Referring Ajahnae Rathgeber: Treating Bishop Vanderwerf/Extender: Elise Benne in Treatment: 23 Wound Status Wound Number: 2 Primary Diabetic Wound/Ulcer of the Lower Extremity Etiology: Wound Location: Left, Distal Calcaneus Wound Open Wounding Event: Blister Status: Date Acquired: 06/15/2022 Comorbid Anemia, Chronic Obstructive Pulmonary Disease (COPD), Weeks Of Treatment: 6 History: Congestive Heart Failure, Hypertension, Myocardial Infarction, Clustered Wound: No Peripheral Venous Disease, Type II Diabetes, Neuropathy Photos Wound Measurements Length: (cm) 2.3 Width: (cm) 3.5 Depth: (cm) 0.1 Area: (cm) 6.322 Volume: (cm) 0.632 % Reduction in Area: 55.3% % Reduction in Volume: 55.3% Epithelialization: Small (1-33%) Tunneling: No Undermining: No Wound Description Varden, Jakeira C ([id number]) Classification: Grade 2 Wound Margin: Distinct, outline attached Exudate Amount: Medium Exudate Type: Serosanguineous Exudate Color: red, brown [phone].pdf Page 9 of 10 Foul Odor After Cleansing: No Slough/Fibrino Yes Wound Bed Granulation Amount: Large (67-100%) Exposed Structure Granulation Quality: Red, Pink Fascia Exposed: No Necrotic Amount: Small (1-33%) Fat Layer (Subcutaneous Tissue) Exposed: Yes Necrotic Quality: Adherent Slough Tendon Exposed: No Muscle Exposed: No Joint Exposed: No Bone Exposed: No Periwound Skin Texture Texture Color No Abnormalities Noted: Yes No Abnormalities Noted: Yes Moisture Temperature / Pain No Abnormalities Noted:  No Temperature: No Abnormality Dry / Scaly: No Maceration: No Treatment Notes Wound #2 (Calcaneus) Wound Laterality: Left, Distal Cleanser Soap and Water Discharge Instruction: May shower and wash wound with dial antibacterial soap and water prior to dressing change. Wound Cleanser Discharge Instruction: Cleanse the wound with wound cleanser prior to applying a clean dressing using gauze sponges, not tissue or cotton balls. Peri-Wound Care Sween Lotion (Moisturizing lotion) Discharge Instruction: Apply moisturizing lotion as directed Topical Primary Dressing Hydrofera Blue Ready Foam, 2.5 x2.5 in Discharge Instruction: apply over the santyl. Santyl Ointment Discharge Instruction: Apply under the hydrofera blue. Secondary Dressing ABD Pad, 8x10 Discharge Instruction: Apply over primary dressing as directed. Woven  Gauze Sponge, Non-Sterile 4x4 in Discharge Instruction: Apply over primary dressing as directed. Secured With Compression Wrap ThreePress (3 layer compression wrap) Discharge Instruction: Apply three layer compression as directed. Compression Stockings Add-Ons Electronic Signature(s) Signed: 08/17/2022 4:10:29 PM By: Rhae Hammock RN Entered By: Rhae Hammock on 08/14/2022 11:58:18 Warrior, Noriah C ([id number]) [id number] Page 10 of 10 -------------------------------------------------------------------------------- Vitals Details Patient Name: Date of Service: [name] Lax C. 08/14/2022 11:30 A M Medical Record Number: [id number] Patient Account Number: [id number] Date of Birth/Sex: Treating RN: 1961-07-16 (61 y.o. [name], [name] Primary Care Natesha Hassey: Jenean Lindau Other Clinician: Referring Jarell Mcewen: Treating Caralynn Gelber/Extender: Elise Benne in Treatment: 23 Vital Signs Time Taken: 11:47 Temperature (F): 98.7 Height (in): 66 Pulse (bpm): 114 Weight (lbs): 339 Respiratory Rate  (breaths/min): 17 Body Mass Index (BMI): 54.7 Blood Pressure (mmHg): 162/78 Reference Range: 80 - 120 mg / dl Electronic Signature(s) Signed: 08/17/2022 4:10:29 PM By: Rhae Hammock RN Entered By: Rhae Hammock on 08/14/2022 11:48:36

## 2022-08-25 ENCOUNTER — Encounter (HOSPITAL_BASED_OUTPATIENT_CLINIC_OR_DEPARTMENT_OTHER): Payer: 59 | Admitting: Internal Medicine

## 2022-08-25 DIAGNOSIS — E11621 Type 2 diabetes mellitus with foot ulcer: Secondary | ICD-10-CM | POA: Diagnosis not present

## 2022-08-25 DIAGNOSIS — L97522 Non-pressure chronic ulcer of other part of left foot with fat layer exposed: Secondary | ICD-10-CM

## 2022-08-26 NOTE — Progress Notes (Signed)
Spells, [name] ([id number]) [id number] Page 1 of 8 Visit Report for 08/25/2022 Arrival Information Details Patient Name: Date of Service: [name]. 08/25/2022 8:00 [name] Medical Record Number: [id number] Patient Account Number: [id number] Date of Birth/Sex: Treating RN: June 29, 1961 (61 y.o. [name], [name] Primary Care Katalena Malveaux: Jenean Lindau Other Clinician: Referring Clare Fennimore: Treating Adem Costlow/Extender: Elise Benne in Treatment: 24 Visit Information History Since Last Visit Added or deleted any medications: No Patient Arrived: Wheel Chair Any new allergies or adverse reactions: No Arrival Time: 08:02 Had [name] fall or experienced change in No Accompanied By: self activities of daily living that may affect Transfer Assistance: Manual risk of falls: Patient Identification Verified: Yes Signs or symptoms of abuse/neglect since last visito No Secondary Verification Process Completed: Yes Hospitalized since last visit: No Patient Requires Transmission-Based Precautions: No Implantable device outside of the clinic excluding No Patient Has Alerts: No cellular tissue based products placed in the center since last visit: Has Dressing in Place as Prescribed: Yes Pain Present Now: No Electronic Signature(s) Signed: 08/25/2022 4:11:21 PM By: Rhae Hammock RN Entered By: Rhae Hammock on 08/25/2022 08:03:19 -------------------------------------------------------------------------------- Compression Therapy Details Patient Name: Date of Service: [name]. 08/25/2022 8:00 [name] Medical Record Number: [id number] Patient Account Number: [id number] Date of Birth/Sex: Treating RN: [birth year] (61 y.o. [name] Primary Care Roby Spalla: Jenean Lindau Other Clinician: Referring Jerol Rufener: Treating Veatrice Eckstein/Extender: Elise Benne in Treatment: 24 Compression Therapy Performed  for Wound Assessment: Wound #2 Left,Distal Calcaneus Performed By: Clinician Deon Pilling, RN Compression Type: Three Layer Post Procedure Diagnosis Same as Pre-procedure Electronic Signature(s) Signed: 08/26/2022 10:43:44 AM By: Deon Pilling RN, BSN Entered By: Deon Pilling on 08/25/2022 08:42:01 Remington, Dorthia [name] ([id number]) [id number] Page 2 of 8 -------------------------------------------------------------------------------- Encounter Discharge Information Details Patient Name: Date of Service: [name]. 08/25/2022 8:00 [name] Medical Record Number: [id number] Patient Account Number: [id number] Date of Birth/Sex: Treating RN: [birth year] (61 y.o. [name], [name] Primary Care Malijah Lietz: Jenean Lindau Other Clinician: Referring Jermone Geister: Treating Takahiro Godinho/Extender: Elise Benne in Treatment: 24 Encounter Discharge Information Items Post Procedure Vitals Discharge Condition: Stable Temperature (F): 98.2 Ambulatory Status: Wheelchair Pulse (bpm): 95 Discharge Destination: Home Respiratory Rate (breaths/min): 20 Transportation: Private Auto Blood Pressure (mmHg): 134/73 Accompanied By: self Schedule Follow-up Appointment: Yes Clinical Summary of Care: Electronic Signature(s) Signed: 08/26/2022 10:43:44 AM By: Deon Pilling RN, BSN Entered By: Deon Pilling on 08/25/2022 08:42:56 -------------------------------------------------------------------------------- Lower Extremity Assessment Details Patient Name: Date of Service: [name], [name]. 08/25/2022 8:00 [name] Medical Record Number: [id number] Patient Account Number: [id number] Date of Birth/Sex: Treating RN: [birth year] (61 y.o. [name], [name] Primary Care Delsie Amador: Jenean Lindau Other Clinician: Referring Youlanda Tomassetti: Treating Kayne Yuhas/Extender: Elise Benne in Treatment: 24 Edema Assessment Assessed: Shirlyn Goltz: Yes] [name]:  No] Edema: [Left: Ye] [Right: s] Calf Left: Right: Point of Measurement: 28 cm From Medial Instep 48 cm Ankle Left: Right: Point of Measurement: 3 cm From Medial Instep 29 cm Vascular Assessment Pulses: Dorsalis Pedis Palpable: [Left:Yes] Posterior Tibial Palpable: [Left:Yes] Electronic Signature(s) Signed: 08/25/2022 4:11:21 PM By: Rhae Hammock RN Entered By: Rhae Hammock on 08/25/2022 08:04:40 Obenchain, [name] ([id number]) [id number] Page 3 of 8 -------------------------------------------------------------------------------- Multi Wound Chart Details Patient Name: Date of Service: [name]. 08/25/2022 8:00 [name] Medical Record Number: [id number] Patient Account Number: [id number] Date of Birth/Sex: Treating RN: 05-27-1961 (61 y.o. F) Primary Care Sheetal Lyall: Jenean Lindau  Other Clinician: Referring Camry Theiss: Treating Tinzley Dalia/Extender: Elise Benne in Treatment: 24 Vital Signs Height(in): 55 Pulse(bpm): 95 Weight(lbs): 339 Blood Pressure(mmHg): 134/73 Body Mass Index(BMI): 54.7 Temperature(F): 98.2 Respiratory Rate(breaths/min): 17 [2:Photos:] [[name]:[name]] Left, Distal Calcaneus [name] Wound Location: Blister [name] Wounding Event: Diabetic Wound/Ulcer of the Lower [name] Primary Etiology: Extremity Anemia, Chronic Obstructive [name] Comorbid History: Pulmonary Disease (COPD), Congestive Heart Failure, Hypertension, Myocardial Infarction, Peripheral Venous Disease, Type II Diabetes, Neuropathy 06/15/2022 [name] Date Acquired: 8 [name] Weeks of Treatment: Open [name] Wound Status: No [name] Wound Recurrence: 1.8x3.2x0.1 [name] Measurements L x W x D (cm) 4.524 [name] [name] (cm) : rea 0.452 [name] Volume (cm) : 68.00% [name] % Reduction in [name] rea: 68.00% [name] % Reduction in Volume: Grade 2 [name] Classification: Medium [name] Exudate [name] mount: Serosanguineous [name]  [name] Exudate Type: red, brown [name] Exudate Color: Distinct, outline attached [name] Wound Margin: Large (67-100%) [name] Granulation [name] mount: Red, Pink [name] Granulation Quality: Small (1-33%) [name] Necrotic [name] mount: Fat Layer (Subcutaneous Tissue): Yes [name] Exposed Structures: Fascia: No Tendon: No Muscle: No Joint: No Bone: No Small (1-33%) [name] Epithelialization: Debridement - Excisional [name] Debridement: Pre-procedure Verification/Time Out 08:30 [name] Taken: Lidocaine 5% topical ointment [name] Pain Control: Subcutaneous, Slough [name] Tissue Debrided: Skin/Subcutaneous Tissue [name] Level: 5.76 [name] Debridement [name] (sq cm): rea Curette, Rongeur [name] Instrument: Minimum [name] Bleeding: Pressure [name] Hemostasis [name] chieved: 0 [name] Procedural Pain: 0 [name] Post Procedural Pain: Procedure was tolerated well [name] Debridement Treatment Response: 1.8x3.2x0.1 [name] Post Debridement Measurements L x W x D (cm) 0.452 [name] Post Debridement Volume: (cm) No Abnormalities Noted [name] Periwound Skin Texture: Maceration: No [name] Periwound Skin Moisture: Dry/Scaly: No No Abnormalities Noted [name] Periwound Skin Color: Pua, Linzie [name] ([id number]) [id number] Page 4 of 8 No Abnormality [name] Temperature: Compression Therapy [name] Procedures Performed: Debridement Treatment Notes Wound #2 (Calcaneus) Wound Laterality: Left, Distal Cleanser Soap and Water Discharge Instruction: May shower and wash wound with dial antibacterial soap and water prior to dressing change. Wound Cleanser Discharge Instruction: Cleanse the wound with wound cleanser prior to applying [name] clean dressing using [name] sponges, not tissue or cotton balls. Peri-Wound Care Sween Lotion (Moisturizing lotion) Discharge Instruction: Apply moisturizing lotion as directed Topical Primary Dressing Hydrofera Blue Ready  Foam, 2.5 x2.5 in Discharge Instruction: apply over the santyl. Santyl Ointment Discharge Instruction: Apply under the hydrofera blue. Secondary Dressing ABD Pad, 8x10 Discharge Instruction: Apply over primary dressing as directed. Woven [name] Sponge, Non-Sterile 4x4 in Discharge Instruction: Apply over primary dressing as directed. Secured With Compression Wrap ThreePress (3 layer compression wrap) Discharge Instruction: Apply three layer compression as directed. Compression Stockings Add-Ons Electronic Signature(s) Signed: 08/25/2022 12:46:27 PM By: Kalman Shan DO Entered By: Kalman Shan on 08/25/2022 08:44:48 -------------------------------------------------------------------------------- Multi-Disciplinary Care Plan Details Patient Name: Date of Service: [name]. 08/25/2022 8:00 [name] Medical Record Number: [id number] Patient Account Number: [id number] Date of Birth/Sex: Treating RN: 05-27-1961 (61 y.o. [name] Primary Care Azim Gillingham: Jenean Lindau Other Clinician: Referring Imagene Boss: Treating De Libman/Extender: Elise Benne in Treatment: 24 Active Inactive Abuse / Safety / Falls / Self Care Management Nursing Diagnoses: History of Falls Goals: Finstad, Amelita [name] ([id number]) [id number] Page 5 of 8 Patient will not experience any injury  related to falls Date Initiated: 03/05/2022 Target Resolution Date: 11/06/2022 Goal Status: Active Interventions: Assess fall risk on admission and as needed Assess: immobility, friction, shearing, incontinence upon admission and as needed Assess impairment of mobility on admission and as needed per policy Notes: 7/49/44: Fall prevention ongoing, recent fall. Wound/Skin Impairment Nursing Diagnoses: Impaired tissue integrity Goals: Patient/caregiver will verbalize understanding of skin care regimen Date Initiated: 03/05/2022 Target Resolution Date:  11/06/2022 Goal Status: Active Ulcer/skin breakdown will have [name] volume reduction of 30% by week 4 Date Initiated: 03/05/2022 Date Inactivated: 04/30/2022 Target Resolution Date: 05/02/2022 Goal Status: Met Ulcer/skin breakdown will have [name] volume reduction of 50% by week 8 Date Initiated: 04/30/2022 Date Inactivated: 07/07/2022 Target Resolution Date: 07/04/2022 Unmet Reason: patient sent three Goal Status: Unmet weeks inpatient and rehab before returning to wound center. Interventions: Assess patient/caregiver ability to obtain necessary supplies Assess patient/caregiver ability to perform ulcer/skin care regimen upon admission and as needed Assess ulceration(s) every visit Provide education on smoking Provide education on ulcer and skin care Treatment Activities: Topical wound management initiated : 03/05/2022 Notes: 04/30/22: Wound care regimen continues. Electronic Signature(s) Signed: 08/26/2022 10:43:44 AM By: Deon Pilling RN, BSN Entered By: Deon Pilling on 08/25/2022 08:09:36 -------------------------------------------------------------------------------- Pain Assessment Details Patient Name: Date of Service: [name], [name]. 08/25/2022 8:00 [name] Medical Record Number: [id number] Patient Account Number: [id number] Date of Birth/Sex: Treating RN: 03-22-1961 (61 y.o. [name], [name] Primary Care Linzy Darling: Jenean Lindau Other Clinician: Referring Seana Underwood: Treating Falon Huesca/Extender: Elise Benne in Treatment: 24 Active Problems Location of Pain Severity and Description of Pain Patient Has Paino No Site Locations [name], [name] ([id number]) [id number] Page 6 of 8 Pain Management and Medication Current Pain Management: Electronic Signature(s) Signed: 08/25/2022 4:11:21 PM By: Rhae Hammock RN Entered By: Rhae Hammock on 08/25/2022  08:04:23 -------------------------------------------------------------------------------- Patient/Caregiver Education Details Patient Name: Date of Service: [name]. 11/21/2023andnbsp8:00 [name] Medical Record Number: [id number] Patient Account Number: [id number] Date of Birth/Gender: Treating RN: 01-09-1961 (61 y.o. [name] Primary Care Physician: Jenean Lindau Other Clinician: Referring Physician: Treating Physician/Extender: Elise Benne in Treatment: 24 Education Assessment Education Provided To: Patient Education Topics Provided Wound/Skin Impairment: Handouts: Skin Care Do's and Dont's Methods: Explain/Verbal Responses: Reinforcements needed Electronic Signature(s) Signed: 08/26/2022 10:43:44 AM By: Deon Pilling RN, BSN Entered By: Deon Pilling on 08/25/2022 08:09:57 -------------------------------------------------------------------------------- Wound Assessment Details Patient Name: Date of Service: [name]. 08/25/2022 8:00 [name] Medical Record Number: [id number] Patient Account Number: [id number] Date of Birth/Sex: Treating RN: January 29, 1961 (61 y.o. [name], [name] Primary Care Pauleen Goleman: Jenean Lindau Other Clinician: Burney [name], [name] ([id number]) [id number] Page 7 of 8 Referring Kaisley Stiverson: Treating Trinidy Masterson/Extender: Elise Benne in Treatment: 24 Wound Status Wound Number: 2 Primary Diabetic Wound/Ulcer of the Lower Extremity Etiology: Wound Location: Left, Distal Calcaneus Wound Open Wounding Event: Blister Status: Date Acquired: 06/15/2022 Comorbid Anemia, Chronic Obstructive Pulmonary Disease (COPD), Weeks Of Treatment: 8 History: Congestive Heart Failure, Hypertension, Myocardial Infarction, Clustered Wound: No Peripheral Venous Disease, Type II Diabetes, Neuropathy Photos Wound Measurements Length: (cm) 1.8 Width: (cm) 3.2 Depth: (cm)  0.1 Area: (cm) 4.524 Volume: (cm) 0.452 % Reduction in Area: 68% % Reduction in Volume: 68% Epithelialization: Small (1-33%) Tunneling: No Undermining: No Wound Description Classification: Grade 2 Wound Margin: Distinct, outline attached Exudate Amount: Medium Exudate Type: Serosanguineous Exudate Color: red, brown Foul Odor After Cleansing: No Slough/Fibrino Yes Wound Bed Granulation Amount: Large (67-100%) Exposed  Structure Granulation Quality: Red, Pink Fascia Exposed: No Necrotic Amount: Small (1-33%) Fat Layer (Subcutaneous Tissue) Exposed: Yes Necrotic Quality: Adherent Slough Tendon Exposed: No Muscle Exposed: No Joint Exposed: No Bone Exposed: No Periwound Skin Texture Texture Color No Abnormalities Noted: Yes No Abnormalities Noted: Yes Moisture Temperature / Pain No Abnormalities Noted: No Temperature: No Abnormality Dry / Scaly: No Maceration: No Treatment Notes Wound #2 (Calcaneus) Wound Laterality: Left, Distal Cleanser Soap and Water Discharge Instruction: May shower and wash wound with dial antibacterial soap and water prior to dressing change. Wound Cleanser Discharge Instruction: Cleanse the wound with wound cleanser prior to applying [name] clean dressing using [name] sponges, not tissue or cotton balls. Peri-Wound Care Sween Lotion (Moisturizing lotion) Discharge Instruction: Apply moisturizing lotion as directed Topical [name], [name] [name] ([id number]) [id number] Page 8 of 8 Primary Dressing Hydrofera Blue Ready Foam, 2.5 x2.5 in Discharge Instruction: apply over the santyl. Santyl Ointment Discharge Instruction: Apply under the hydrofera blue. Secondary Dressing ABD Pad, 8x10 Discharge Instruction: Apply over primary dressing as directed. Woven [name] Sponge, Non-Sterile 4x4 in Discharge Instruction: Apply over primary dressing as directed. Secured With Compression Wrap ThreePress (3 layer compression wrap) Discharge  Instruction: Apply three layer compression as directed. Compression Stockings Add-Ons Electronic Signature(s) Signed: 08/25/2022 4:11:21 PM By: Rhae Hammock RN Signed: 08/26/2022 10:43:44 AM By: Deon Pilling RN, BSN Entered By: Deon Pilling on 08/25/2022 08:05:30 -------------------------------------------------------------------------------- Vitals Details Patient Name: Date of Service: [name], [name]. 08/25/2022 8:00 [name] Medical Record Number: [id number] Patient Account Number: [id number] Date of Birth/Sex: Treating RN: 1960/11/29 (61 y.o. [name], [name] Primary Care Levelle Edelen: Jenean Lindau Other Clinician: Referring Avelino Herren: Treating Chrisie Jankovich/Extender: Elise Benne in Treatment: 24 Vital Signs Time Taken: 08:03 Temperature (F): 98.2 Height (in): 66 Pulse (bpm): 95 Weight (lbs): 339 Respiratory Rate (breaths/min): 17 Body Mass Index (BMI): 54.7 Blood Pressure (mmHg): 134/73 Reference Range: 80 - 120 mg / dl Electronic Signature(s) Signed: 08/25/2022 4:11:21 PM By: Rhae Hammock RN Entered By: Rhae Hammock on 08/25/2022 08:04:16

## 2022-08-26 NOTE — Progress Notes (Signed)
Slates, [name] ([id number]) [id number] Page 1 of 9 Visit Report for 08/25/2022 Chief Complaint Document Details Patient Name: Date of Service: [name] [name]. 08/25/2022 8:00 [name] Medical Record Number: [id number] Patient Account Number: [id number] Date of Birth/Sex: Treating RN: 19-Nov-1960 (61 y.o. F) Primary Care Provider: [name] Other Clinician: Referring Provider: Treating Provider/Extender: [name] in Treatment: 24 Information Obtained from: Patient Chief Complaint 03/05/2022; left foot wound Electronic Signature(s) Signed: 08/25/2022 12:46:27 PM By: Geralyn Corwin DO Entered By: Geralyn Corwin on 08/25/2022 08:45:03 -------------------------------------------------------------------------------- Debridement Details Patient Name: Date of Service: [name] [name]. 08/25/2022 8:00 [name] Medical Record Number: [id number] Patient Account Number: [id number] Date of Birth/Sex: Treating RN: [birth year] (61 y.o. [name], [name] Primary Care Provider: [name] Other Clinician: Referring Provider: Treating Provider/Extender: [name] in Treatment: 24 Debridement Performed for Assessment: Wound #2 Left,Distal Calcaneus Performed By: Physician Geralyn Corwin, DO Debridement Type: Debridement Severity of Tissue Pre Debridement: Fat layer exposed Level of Consciousness (Pre-procedure): Awake and Alert Pre-procedure Verification/Time Out Yes - 08:30 Taken: Start Time: 08:31 Pain Control: Lidocaine 5% topical ointment T Area Debrided (L x W): otal 1.8 (cm) x 3.2 (cm) = 5.76 (cm) Tissue and other material debrided: Viable, Non-Viable, Slough, Subcutaneous, Slough Level: Skin/Subcutaneous Tissue Debridement Description: Excisional Instrument: Curette, Rongeur Bleeding: Minimum Hemostasis Achieved: Pressure End Time: 08:40 Procedural Pain: 0 Post Procedural Pain:  0 Response to Treatment: Procedure was tolerated well Level of Consciousness (Post- Awake and Alert procedure): Post Debridement Measurements of Total Wound Length: (cm) 1.8 Width: (cm) 3.2 Depth: (cm) 0.1 Volume: (cm) 0.452 Character of Wound/Ulcer Post Debridement: Improved Severity of Tissue Post Debridement: Fat layer exposed Vandenberg, [name] ([id number]) [id number] Page 2 of 9 Post Procedure Diagnosis Same as Pre-procedure Electronic Signature(s) Signed: 08/25/2022 12:46:27 PM By: Geralyn Corwin DO Signed: 08/26/2022 10:43:44 AM By: Shawn Stall RN, BSN Entered By: Shawn Stall on 08/25/2022 08:40:48 -------------------------------------------------------------------------------- HPI Details Patient Name: Date of Service: [name], [name]. 08/25/2022 8:00 [name] Medical Record Number: [id number] Patient Account Number: [id number] Date of Birth/Sex: Treating RN: [birth year] (61 y.o. F) Primary Care Provider: [name] Other Clinician: Referring Provider: Treating Provider/Extender: [name] in Treatment: 24 History of Present Illness HPI Description: Admission 03/05/2022 [name] is [name] 61 year old female with [name] past medical history of uncontrolled insulin-dependent type 2 diabetes, tobacco user and chronic diastolic heart failure that presents to the clinic for [name] 75-month history of wound to her left heel. She states she had [name] left ankle fusion in September 2022. She states that she always had [name] wound after the surgery and it never healed. She has home health and they have been doing compression wraps along with silver alginate to the wound bed. She currently denies signs of infection. 6/8; this is [name] 61 year old woman with type 2 diabetes. She developed [name] wound on her left Achilles heel just above the tip of the heel in the setting of recurrent ankle surgeries in late 2022. I have seen some of these results  from either the cast or the surgical boots that are put on after these operations. She thinks this may be the case. And [name] sense of pressure ulcer. In any case that she has been using Santyl Hydrofera Blue under compression. She is not wearing any footwear at home as she cannot find anything to accommodate the wrap 6/22; patient presents for follow-up. She has home health that comes  out once [name] week to help with dressing changes. She has no issues or complaints today. We have been using Hydrofera Blue under compression therapy. 7/6; patient presents for follow-up. She states that home health did not come out for the past 2 weeks. It is unclear why. We have been using Hydrofera Blue and Santyl under compression therapy. 7/13; patient presents for follow-up. She did not take the oral antibiotics prescribed at last clinic visit. We have been using Hydrofera Blue with gentamicin/mupirocin ointment under compression therapy. She has no issues or complaints today. She denies signs of infection. 7/20; patient presents for follow-up. We have been using Hydrofera Blue with antibiotic ointment under 3 layer compression. She has home health that change the dressing once. They put collagen on the wound bed. She also reports falling yesterday and hitting her right foot. She has no pain to the area today. 7/27; patient presents for follow-up. We have been using Hydrofera Blue under 3 layer compression. She has home health that changes the dressing. She has no issues or complaints today. 8/3; patient presents for follow-up. We continues to use Hydrofera Blue under 3 layer compression. She has no issues or complaints today. She has been approved for Epicord at 100%. 8/11; patient presents for follow-up. We have been using Hydrofera Blue under 3 layer compression. She has been approved for Epicord and we have this today. She is in agreement with having this applied. 8/18; patient presents for follow-up. Epicord #1 was  placed in standard fashion last clinic visit. She has no issues or complaints today. 9/18; Unfortunately patient has missed her last clinic appointments due to falling and breaking her right ankle. We have been following her for her left ankle wound. She has also developed [name] large blister to the left heel over the past week that has ruptured and dried. She currently resides In [facility]. Wound9/25; patient presents for follow-up. We have been using Hydrofera Blue and Santyl to the original left ankle wound and Xeroform to the dried blistered. We have been wrapping her with compression therapy. She resides in [name] facility and they changed the wrap once this past week. She has no issues or complaints today. She denies signs of infection. 10/3; patient presents for follow-up. We have been using Xeroform to the heel wound and Epicort to the left ankle wound All under compression therapy. She has no issues or complaints today. 10/10; patient presents for follow-up. We have been using Epicort to the left ankle wound and Santyl and Hydrofera Blue to the heel wound. All under compression therapy. she has no issues or complaints today. 10/16; patient presents for follow-up. Epicort was placed in standard fashion to the superior wound and onto the heel and Santyl and Hydrofera Blue. She states that the wrap got wet during her shower and her facility was able to rewrap with Kerlix/Coban. 10/23; patient presents for follow-up. Epicord was placed to the wound beds last clinic visit. We continue Kerlix/Coban. She has no issues or complaints today. 10/31; Patient presents for follow-up. Epicord was placed to the wound beds last clinic visit. The original wound is almost healed. We have done this under Kerlix/Coban. She denies signs of infection. Vetere, Citlalli [name] ([id number]) [id number] Page 3 of 9 11/10; patient presents for follow-up. Patient's last epi cord was placed in standard  fashion at last clinic visit. The original wound has healed. She still has [name] heel wound. Grafix was approved however too costly for the patient. We will rerun epi  cord to see if she can have more applications. She had done very well with this. We have been using Hydrofera Blue to the heel under Kerlix/Coban. 11/21; patient presents for follow-up. We have been using Hydrofera Blue and Santyl to the heel under Kerlix/Coban. She switched to [name] new healthcare insurance and again The skin substitute is too costly for the patient. She denies signs of infection. Electronic Signature(s) Signed: 08/25/2022 12:46:27 PM By: Geralyn Corwin DO Entered By: Geralyn Corwin on 08/25/2022 08:46:38 -------------------------------------------------------------------------------- Physical Exam Details Patient Name: Date of Service: [name], [name]. 08/25/2022 8:00 [name] Medical Record Number: [id number] Patient Account Number: [id number] Date of Birth/Sex: Treating RN: 08-23-1961 (61 y.o. F) Primary Care Provider: [name] Other Clinician: Referring Provider: Treating Provider/Extender: [name] in Treatment: 24 Constitutional respirations regular, non-labored and within target range for patient.. Cardiovascular 2+ dorsalis pedis/posterior tibialis pulses. Psychiatric pleasant and cooperative. Notes Left foot: The heel wound has granulation tissue and slough. No surrounding signs of infection. 2+ pitting edema to the knee. Electronic Signature(s) Signed: 08/25/2022 12:46:27 PM By: Geralyn Corwin DO Entered By: Geralyn Corwin on 08/25/2022 08:47:22 -------------------------------------------------------------------------------- Physician Orders Details Patient Name: Date of Service: [name], [name]. 08/25/2022 8:00 [name] Medical Record Number: [id number] Patient Account Number: [id number] Date of Birth/Sex: Treating RN: 01-06-1961 (61 y.o. [name],  [name] Primary Care Provider: [name] Other Clinician: Referring Provider: Treating Provider/Extender: [name] in Treatment: 24 Verbal / Phone Orders: No Diagnosis Coding ICD-10 Coding Code Description 9803573044 Non-pressure chronic ulcer of other part of left foot with fat layer exposed E11.621 Type 2 diabetes mellitus with foot ulcer E66.01 Morbid (severe) obesity due to excess calories Follow-up Appointments ppointment in 1 week. - Dr. Mikey Bussing Return [name] ppointment in 2 weeks. - Dr. Mikey Bussing Return [name] Other: - facility to continue to change. Geffre, Virdie [name] ([id number]) [id number] Page 4 of 9 Anesthetic (In clinic) Topical Lidocaine 5% applied to wound bed Cellular or Tissue Based Products Cellular or Tissue Based Product Type: - Run IVR for EpiFix and Epicord=100% covered 05/07/22: Epicord ordered daptic or Mepitel. (DO NOT REMOVE). - Cellular or Tissue Based Product applied to wound bed, secured with steri-strips, cover with [name] Epicord #1 05/15/25 Epicord #2 05/22/22 Epicord #3 06/29/2022 Epicord #4 07/07/2022 Epifix #5 07/14/22 Epicord #6 07/20/22 Epicord # 7 07/27/22 ***Run for Grafix***PENDING Epicord #8 08/04/2022 Bathing/ Shower/ Hygiene May shower with protection but do not get wound dressing(s) wet. - May use cast protector bag from [facility], [facility] or [employer] Edema Control - Lymphedema / SCD / Other Elevate legs to the level of the heart or above for 30 minutes daily and/or when sitting, [name] frequency of: Avoid standing for long periods of time. Off-Loading Prevalon Boot - use while in bed and chair. Additional Orders / Instructions Follow Nutritious Diet - Monitor/Control Blood Sugar Wound Treatment Wound #2 - Calcaneus Wound Laterality: Left, Distal Cleanser: Soap and Water 2 x Per Week/30 Days Discharge Instructions: May shower and wash wound with dial antibacterial soap and water prior to dressing  change. Cleanser: Wound Cleanser 2 x Per Week/30 Days Discharge Instructions: Cleanse the wound with wound cleanser prior to applying [name] clean dressing using gauze sponges, not tissue or cotton balls. Peri-Wound Care: Sween Lotion (Moisturizing lotion) 2 x Per Week/30 Days Discharge Instructions: Apply moisturizing lotion as directed Prim Dressing: Hydrofera Blue Ready Foam, 2.5 x2.5 in 2 x Per Week/30 Days ary Discharge Instructions: apply over the  santyl. Prim Dressing: Santyl Ointment 2 x Per Week/30 Days ary Discharge Instructions: Apply under the hydrofera blue. Secondary Dressing: ABD Pad, 8x10 2 x Per Week/30 Days Discharge Instructions: Apply over primary dressing as directed. Secondary Dressing: Woven Gauze Sponge, Non-Sterile 4x4 in (Home Health) 2 x Per Week/30 Days Discharge Instructions: Apply over primary dressing as directed. Compression Wrap: ThreePress (3 layer compression wrap) (Home Health) 2 x Per Week/30 Days Discharge Instructions: Apply three layer compression as directed. Electronic Signature(s) Signed: 08/25/2022 12:46:27 PM By: Geralyn CorwinHoffman, Yanelis Osika DO Entered By: Geralyn CorwinHoffman, Dayana Dalporto on 08/25/2022 08:47:29 -------------------------------------------------------------------------------- Problem List Details Patient Name: Date of Service: [name] IslamHUFFMA [name], [name]. 08/25/2022 8:00 [name] Medical Record Number: [id number] Patient Account Number: [id number] Date of Birth/Sex: Treating RN: 07-Sep-1961 (61 y.o. Debara [name]) [name], [name] Primary Care Provider: Junious Dresserampbell, Stephen Other Clinician: Referring Provider: Treating Provider/Extender: Andree MoroHoffman, Jaydenn Boccio Campbell, Stephen Weeks in Treatment: 24 Frappier, VirginiaMY [name] ([id number]) [id number] Page 5 of 9 Active Problems ICD-10 Encounter Code Description Active Date MDM Diagnosis L97.522 Non-pressure chronic ulcer of other part of left foot with fat layer exposed 03/05/2022 No Yes E11.621 Type 2 diabetes mellitus with  foot ulcer 03/05/2022 No Yes E66.01 Morbid (severe) obesity due to excess calories 03/05/2022 No Yes Inactive Problems Resolved Problems Electronic Signature(s) Signed: 08/25/2022 12:46:27 PM By: Geralyn CorwinHoffman, Rillie Riffel DO Entered By: Geralyn CorwinHoffman, Alexandrea Westergard on 08/25/2022 08:44:40 -------------------------------------------------------------------------------- Progress Note Details Patient Name: Date of Service: [name] BushyHUFFMA [name], [name]. 08/25/2022 8:00 [name] Medical Record Number: [id number] Patient Account Number: [id number] Date of Birth/Sex: Treating RN: 07-Sep-1961 (61 y.o. F) Primary Care Provider: Junious Dresserampbell, Stephen Other Clinician: Referring Provider: Treating Provider/Extender: Andree MoroHoffman, Arianis Bowditch Campbell, Stephen Weeks in Treatment: 24 Subjective Chief Complaint Information obtained from Patient 03/05/2022; left foot wound History of Present Illness (HPI) Admission 03/05/2022 Ms. Jacquelyne Lou MinerHuffman is [name] 61 year old female with [name] past medical history of uncontrolled insulin-dependent type 2 diabetes, tobacco user and chronic diastolic heart failure that presents to the clinic for [name] 6342-month history of wound to her left heel. She states she had [name] left ankle fusion in September 2022. She states that she always had [name] wound after the surgery and it never healed. She has home health and they have been doing compression wraps along with silver alginate to the wound bed. She currently denies signs of infection. 6/8; this is [name] 61 year old woman with type 2 diabetes. She developed [name] wound on her left Achilles heel just above the tip of the heel in the setting of recurrent ankle surgeries in late 2022. I have seen some of these results from either the cast or the surgical boots that are put on after these operations. She thinks this may be the case. And [name] sense of pressure ulcer. In any case that she has been using Santyl Hydrofera Blue under compression. She is not wearing any footwear at home as she cannot find anything to  accommodate the wrap 6/22; patient presents for follow-up. She has home health that comes out once [name] week to help with dressing changes. She has no issues or complaints today. We have been using Hydrofera Blue under compression therapy. 7/6; patient presents for follow-up. She states that home health did not come out for the past 2 weeks. It is unclear why. We have been using Hydrofera Blue and Santyl under compression therapy. 7/13; patient presents for follow-up. She did not take the oral antibiotics prescribed at last clinic visit. We have been using Hydrofera Blue with gentamicin/mupirocin ointment under compression therapy. She has  no issues or complaints today. She denies signs of infection. 7/20; patient presents for follow-up. We have been using Hydrofera Blue with antibiotic ointment under 3 layer compression. She has home health that change the dressing once. They put collagen on the wound bed. She also reports falling yesterday and hitting her right foot. She has no pain to the area today. 7/27; patient presents for follow-up. We have been using Hydrofera Blue under 3 layer compression. She has home health that changes the dressing. She has no issues or complaints today. 8/3; patient presents for follow-up. We continues to use Hydrofera Blue under 3 layer compression. She has no issues or complaints today. She has been Kopp, Rhealyn [name] ([id number]) [id number] Page 6 of 9 approved for Epicord at 100%. 8/11; patient presents for follow-up. We have been using Hydrofera Blue under 3 layer compression. She has been approved for Epicord and we have this today. She is in agreement with having this applied. 8/18; patient presents for follow-up. Epicord #1 was placed in standard fashion last clinic visit. She has no issues or complaints today. 9/18; Unfortunately patient has missed her last clinic appointments due to falling and breaking her right ankle. We have been  following her for her left ankle wound. She has also developed [name] large blister to the left heel over the past week that has ruptured and dried. She currently resides In [facility]. Wound9/25; patient presents for follow-up. We have been using Hydrofera Blue and Santyl to the original left ankle wound and Xeroform to the dried blistered. We have been wrapping her with compression therapy. She resides in [name] facility and they changed the wrap once this past week. She has no issues or complaints today. She denies signs of infection. 10/3; patient presents for follow-up. We have been using Xeroform to the heel wound and Epicort to the left ankle wound All under compression therapy. She has no issues or complaints today. 10/10; patient presents for follow-up. We have been using Epicort to the left ankle wound and Santyl and Hydrofera Blue to the heel wound. All under compression therapy. she has no issues or complaints today. 10/16; patient presents for follow-up. Epicort was placed in standard fashion to the superior wound and onto the heel and Santyl and Hydrofera Blue. She states that the wrap got wet during her shower and her facility was able to rewrap with Kerlix/Coban. 10/23; patient presents for follow-up. Epicord was placed to the wound beds last clinic visit. We continue Kerlix/Coban. She has no issues or complaints today. 10/31; Patient presents for follow-up. Epicord was placed to the wound beds last clinic visit. The original wound is almost healed. We have done this under Kerlix/Coban. She denies signs of infection. 11/10; patient presents for follow-up. Patient's last epi cord was placed in standard fashion at last clinic visit. The original wound has healed. She still has [name] heel wound. Grafix was approved however too costly for the patient. We will rerun epi cord to see if she can have more applications. She had done very well with this. We have been using Hydrofera Blue to the heel  under Kerlix/Coban. 11/21; patient presents for follow-up. We have been using Hydrofera Blue and Santyl to the heel under Kerlix/Coban. She switched to [name] new healthcare insurance and again The skin substitute is too costly for the patient. She denies signs of infection. Patient History Information obtained from Patient, Chart. Family History Cancer - Father,Mother, Diabetes - Mother,Maternal Grandparents,Paternal Grandparents, Heart Disease - Siblings,Maternal Grandparents,  Hypertension - Siblings, Tuberculosis - Maternal Grandparents,Paternal Grandparents. Social History Current every day smoker, Marital Status - Single, Alcohol Use - Never, Drug Use - No History, Caffeine Use - Daily. 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 Vitals Time Taken: 8:03 AM, Height: 66 in, Weight: 339 lbs, BMI: 54.7, Temperature: 98.2 F, Pulse: 95 bpm, Respiratory Rate: 17 breaths/min, Blood Pressure: 134/73 mmHg. Cardiovascular 2+ dorsalis pedis/posterior tibialis pulses. Psychiatric pleasant and cooperative. Hoffmann, Jaelyn [name] ([id number]) [id number] Page 7 of 9 General Notes: Left foot: The heel wound has granulation tissue and slough. No surrounding signs of infection. 2+ pitting edema to the knee. Integumentary (Hair, Skin) Wound #2 status is Open.  Original cause of wound was Blister. The date acquired was: 06/15/2022. The wound has been in treatment 8 weeks. The wound is located on the Left,Distal Calcaneus. The wound measures 1.8cm length x 3.2cm width x 0.1cm depth; 4.524cm^2 area and 0.452cm^3 volume. There is Fat Layer (Subcutaneous Tissue) exposed. There is no tunneling or undermining noted. There is [name] medium amount of serosanguineous drainage noted. The wound margin is distinct with the outline attached to the wound base. There is large (67-100%) red, pink granulation within the wound bed. There is [name] small (1-33%) amount of necrotic tissue within the wound bed including Adherent Slough. The periwound skin appearance had no abnormalities noted for texture. The periwound skin appearance had no abnormalities noted for color. The periwound skin appearance did not exhibit: Dry/Scaly, Maceration. Periwound temperature was noted as No Abnormality. Assessment Active Problems ICD-10 Non-pressure chronic ulcer of other part of left foot with fat layer exposed Type 2 diabetes mellitus with foot ulcer Morbid (severe) obesity due to excess calories Patient's wound has shown improvement in size and appearance since last clinic visit. I debrided nonviable tissue. I recommended continuing the course with Hydrofera Blue and Santyl under compression therapy. Follow-up in 1 week. Procedures Wound #2 Pre-procedure diagnosis of Wound #2 is [name] Diabetic Wound/Ulcer of the Lower Extremity located on the Left,Distal Calcaneus .Severity of Tissue Pre Debridement is: Fat layer exposed. There was [name] Excisional Skin/Subcutaneous Tissue Debridement with [name] total area of 5.76 sq cm performed by Geralyn Corwin, DO. With the following instrument(s): Curette, and Rongeur to remove Viable and Non-Viable tissue/material. Material removed includes Subcutaneous Tissue and Slough and after achieving pain control using Lidocaine 5% topical ointment. [name] time out was conducted  at 08:30, prior to the start of the procedure. [name] Minimum amount of bleeding was controlled with Pressure. The procedure was tolerated well with [name] pain level of 0 throughout and [name] pain level of 0 following the procedure. Post Debridement Measurements: 1.8cm length x 3.2cm width x 0.1cm depth; 0.452cm^3 volume. Character of Wound/Ulcer Post Debridement is improved. Severity of Tissue Post Debridement is: Fat layer exposed. 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 There was [name] Three Layer Compression Therapy Procedure by Shawn Stall, RN. Post procedure Diagnosis Wound #2:  Same as Pre-Procedure Plan Follow-up Appointments: Return Appointment in 1 week. - Dr. Mikey Bussing Return Appointment in 2 weeks. - Dr. Mikey Bussing Other: - facility to continue to change. Anesthetic: (In clinic) Topical Lidocaine 5% applied to wound bed Cellular or Tissue Based Products: Cellular or Tissue Based Product Type: - Run IVR for EpiFix and Epicord=100% covered 05/07/22: Epicord ordered Cellular or Tissue Based Product applied to wound bed, secured with steri-strips, cover with Adaptic or Mepitel. (DO NOT REMOVE). - Epicord #1 05/15/25 Epicord #2 05/22/22 Epicord #3 06/29/2022 Epicord #4 07/07/2022 Epifix #5 07/14/22 Epicord #6 07/20/22 Epicord # 7 07/27/22 ***Run for Grafix***PENDING Epicord #8 08/04/2022 Bathing/ Shower/ Hygiene: May shower with protection but do not get wound dressing(s) wet. - May use cast protector bag from [facility], [facility] or [employer] Edema Control - Lymphedema / SCD / Other: Elevate legs to the level of the heart or above for 30 minutes daily and/or when sitting, [name] frequency of: Avoid standing for long periods of time. Off-Loading: Prevalon Boot - use while in bed and chair. Additional Orders / Instructions: Follow Nutritious Diet - Monitor/Control Blood Sugar WOUND  #2: - Calcaneus Wound Laterality: Left, Distal Cleanser: Soap and Water 2 x Per Week/30 Days Discharge Instructions: May shower and wash wound with dial antibacterial soap and water prior to dressing change. Cleanser: Wound Cleanser 2 x Per Week/30 Days Discharge Instructions: Cleanse the wound with wound cleanser prior to applying [name] clean dressing using gauze sponges, not tissue or cotton balls. Peri-Wound Care: Sween Lotion (Moisturizing lotion) 2 x Per Week/30 Days Discharge Instructions: Apply moisturizing lotion as directed Prim Dressing: Hydrofera Blue Ready Foam, 2.5 x2.5 in 2 x Per Week/30 Days ary Discharge Instructions: apply over the santyl. Prim Dressing: Santyl Ointment 2 x Per Week/30 Days ary Discharge Instructions: Apply under the hydrofera blue. Kiely, Damita [name] ([id number]) [id number] Page 8 of 9 Secondary Dressing: ABD Pad, 8x10 2 x Per Week/30 Days Discharge Instructions: Apply over primary dressing as directed. Secondary Dressing: Woven Gauze Sponge, Non-Sterile 4x4 in (Home Health) 2 x Per Week/30 Days Discharge Instructions: Apply over primary dressing as directed. Compression Wrap: ThreePress (3 layer compression wrap) (Home Health) 2 x Per Week/30 Days Discharge Instructions: Apply three layer compression as directed. 1. In office sharp debridement 2. Hydrofera Blue and Santyl under Kerlix/Coban 3. Follow-up in 1 week Electronic Signature(s) Signed: 08/25/2022 12:46:27 PM By: Geralyn Corwin DO Entered By: Geralyn Corwin on 08/25/2022 08:48:04 -------------------------------------------------------------------------------- HxROS Details Patient Name: Date of Service: [name], [name]. 08/25/2022 8:00 [name] Medical Record Number: [id number] Patient Account Number: [id number] Date of Birth/Sex: Treating RN: 20-Dec-1960 (61 y.o. F) Primary Care Provider: [name] Other Clinician: Referring Provider: Treating Provider/Extender:  [name] in Treatment: 24 Information Obtained From Patient Chart Hematologic/Lymphatic Medical History: Positive for: Anemia Respiratory Medical History: Positive for: Chronic Obstructive Pulmonary Disease (COPD) Cardiovascular Medical History: Positive for: Congestive Heart Failure - Weighs self daily; Hypertension; Myocardial Infarction; Peripheral Venous Disease Gastrointestinal Medical History: Past Medical History Notes: GERD, Peptic Ulcer Disease Endocrine Medical History: Positive for: Type II Diabetes Time with diabetes: 16 years Treated with: Insulin, Oral agents Blood sugar tested every day: Yes Tested : Twice daily Musculoskeletal Medical History: Past Medical History Notes: Broke left leg 3 years ago, 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, foot fused Neurologic Medical History: Positive for: Neuropathy Hartung, Christina [name] ([id number]) [id number] Page 9 of 9 Psychiatric Medical History: Past Medical History Notes: Depression Immunizations Pneumococcal Vaccine: Received Pneumococcal Vaccination: No Implantable Devices  None Family and Social History Cancer: Yes Scientist, research (physical sciences); Diabetes: Yes - Mother,Maternal Grandparents,Paternal Grandparents; Heart Disease: Yes - Siblings,Maternal Grandparents; Hypertension: Yes - Siblings; Tuberculosis: Yes - Maternal Grandparents,Paternal Grandparents; Current every day smoker; Marital Status - Single; Alcohol Use: Never; Drug Use: No History; Caffeine Use: Daily; Financial Concerns: No; Food, Clothing or Shelter Needs: No; Support System Lacking: No; Transportation Concerns: No Electronic Signature(s) Signed: 08/25/2022 12:46:27 PM By: Geralyn Corwin DO Entered By: Geralyn Corwin on 08/25/2022 08:46:44 -------------------------------------------------------------------------------- SuperBill Details Patient Name: Date of Service: [name] [name]. 08/25/2022 Medical  Record Number: [id number] Patient Account Number: [id number] Date of Birth/Sex: Treating RN: Jul 30, 1961 (61 y.o. [name] Primary Care Provider: [name] Other Clinician: Referring Provider: Treating Provider/Extender: [name] in Treatment: 24 Diagnosis Coding ICD-10 Codes Code Description [phone] Non-pressure chronic ulcer of other part of left foot with fat layer exposed E11.621 Type 2 diabetes mellitus with foot ulcer E66.01 Morbid (severe) obesity due to excess calories Facility Procedures : CPT4 Code: [id number] Description: 11042 - DEB SUBQ TISSUE 20 SQ CM/< ICD-10 Diagnosis Description L97.522 Non-pressure chronic ulcer of other part of left foot with fat layer exposed Modifier: Quantity: 1 Physician Procedures : CPT4 Code Description Modifier [id number] 11042 - WC PHYS SUBQ TISS 20 SQ CM ICD-10 Diagnosis Description L97.522 Non-pressure chronic ulcer of other part of left foot with fat layer exposed Quantity: 1 Electronic Signature(s) Signed: 08/25/2022 12:46:27 PM By: Geralyn Corwin DO Entered By: Geralyn Corwin on 08/25/2022 08:48:25

## 2022-09-01 ENCOUNTER — Encounter (HOSPITAL_BASED_OUTPATIENT_CLINIC_OR_DEPARTMENT_OTHER): Payer: 59 | Admitting: Internal Medicine

## 2022-09-01 DIAGNOSIS — L97522 Non-pressure chronic ulcer of other part of left foot with fat layer exposed: Secondary | ICD-10-CM | POA: Diagnosis not present

## 2022-09-01 DIAGNOSIS — E11621 Type 2 diabetes mellitus with foot ulcer: Secondary | ICD-10-CM | POA: Diagnosis not present

## 2022-09-01 NOTE — Progress Notes (Addendum)
[name], [name] ([id number]) [id number] Page 1 of 8 Visit Report for 09/01/2022 Arrival Information Details Patient Name: Date of Service: [name]. 09/01/2022 2:00 PM Medical Record Number: [id number] Patient Account Number: [id number] Date of Birth/Sex: Treating RN: [birth year] (61 y.o. F) Primary Care Koah Chisenhall: Jenean Lindau Other Clinician: Referring Lacreasha Hinds: Treating Eddie Koc/Extender: Elise Benne in Treatment: 25 Visit Information History Since Last Visit All ordered tests and consults were completed: No Patient Arrived: Wheel Chair Added or deleted any medications: No Arrival Time: 13:55 Any new allergies or adverse reactions: No Accompanied By: caretaker Had [name] fall or experienced change in No Transfer Assistance: None activities of daily living that may affect Patient Identification Verified: Yes risk of falls: Secondary Verification Process Completed: Yes Signs or symptoms of abuse/neglect since last visito No Patient Requires Transmission-Based Precautions: No Hospitalized since last visit: No Patient Has Alerts: No Implantable device outside of the clinic excluding No cellular tissue based products placed in the center since last visit: Pain Present Now: No Electronic Signature(s) Signed: 09/01/2022 2:11:10 PM By: Worthy Rancher Entered By: Worthy Rancher on 09/01/2022 13:56:16 -------------------------------------------------------------------------------- Compression Therapy Details Patient Name: Date of Service: [name]. 09/01/2022 2:00 PM Medical Record Number: [id number] Patient Account Number: [id number] Date of Birth/Sex: Treating RN: 06/27/1961 (61 y.o. [name] Primary Care Yenifer Saccente: Jenean Lindau Other Clinician: Referring Veida Spira: Treating Mellonie Guess/Extender: Elise Benne in Treatment: 25 Compression Therapy Performed for Wound Assessment:  Wound #2 Left,Distal Calcaneus Performed By: Clinician Deon Pilling, RN Compression Type: Three Layer Post Procedure Diagnosis Same as Pre-procedure Electronic Signature(s) Signed: 09/01/2022 4:47:08 PM By: Deon Pilling RN, BSN Entered By: Deon Pilling on 09/01/2022 14:38:17 [name] ([id number]) [id number] Page 2 of 8 -------------------------------------------------------------------------------- Encounter Discharge Information Details Patient Name: Date of Service: [name]. 09/01/2022 2:00 PM Medical Record Number: [id number] Patient Account Number: [id number] Date of Birth/Sex: Treating RN: 1961/04/14 (61 y.o. [name] Primary Care Yoseph Haile: Jenean Lindau Other Clinician: Referring Vianca Bracher: Treating Haylyn Halberg/Extender: Elise Benne in Treatment: 25 Encounter Discharge Information Items Post Procedure Vitals Discharge Condition: Stable Temperature (F): 98.4 Ambulatory Status: Wheelchair Pulse (bpm): 110 Discharge Destination: Home Respiratory Rate (breaths/min): 20 Transportation: Private Auto Blood Pressure (mmHg): 148/64 Accompanied By: self Schedule Follow-up Appointment: Yes Clinical Summary of Care: Electronic Signature(s) Signed: 09/01/2022 4:47:08 PM By: Deon Pilling RN, BSN Entered By: Deon Pilling on 09/01/2022 14:39:51 -------------------------------------------------------------------------------- Lower Extremity Assessment Details Patient Name: Date of Service: [name]. 09/01/2022 2:00 PM Medical Record Number: [id number] Patient Account Number: [id number] Date of Birth/Sex: Treating RN: 1961-01-27 (61 y.o. [name], [name] Primary Care Zyquan Crotty: Jenean Lindau Other Clinician: Referring Vanessa Alesi: Treating Kawehi Hostetter/Extender: Elise Benne in Treatment: 25 Edema Assessment Assessed: Shirlyn Goltz: Yes] Patrice Paradise: No] Edema: [Left: Ye] [Right:  s] Calf Left: Right: Point of Measurement: 28 cm From Medial Instep 48 cm Ankle Left: Right: Point of Measurement: 3 cm From Medial Instep 28 cm Vascular Assessment Pulses: Dorsalis Pedis Palpable: [Left:Yes] Electronic Signature(s) Signed: 09/01/2022 4:47:08 PM By: Deon Pilling RN, BSN Entered By: Deon Pilling on 09/01/2022 14:21:06 Multi Wound Chart Details -------------------------------------------------------------------------------- [name] ([id number]) [id number] Page 3 of 8 Patient Name: Date of Service: [name]. 09/01/2022 2:00 PM Medical Record Number: [id number] Patient Account Number: [id number] Date of Birth/Sex: Treating RN: [birth year] (61 y.o. F) Primary Care Vanette Noguchi: Jenean Lindau Other Clinician: Referring Boss Danielsen: Treating Cavan Bearden/Extender: Waldron Session  Weeks in Treatment: 25 Vital Signs Height(in): 66 Capillary Blood Glucose(mg/dl): 201 Weight(lbs): 339 Pulse(bpm): 110 Body Mass Index(BMI): 54.7 Blood Pressure(mmHg): 148/64 Temperature(F): 98.4 Respiratory Rate(breaths/min): 20 Wound Assessments Wound Number: 2 N/[name] N/[name] Photos: N/[name] N/[name] Left, Distal Calcaneus N/[name] N/[name] Wound Location: Blister N/[name] N/[name] Wounding Event: Diabetic Wound/Ulcer of the Lower N/[name] N/[name] Primary Etiology: Extremity Anemia, Chronic Obstructive N/[name] N/[name] Comorbid History: Pulmonary Disease (COPD), Congestive Heart Failure, Hypertension, Myocardial Infarction, Peripheral Venous Disease, Type II Diabetes, Neuropathy 06/15/2022 N/[name] N/[name] Date Acquired: 9 N/[name] N/[name] Weeks of Treatment: Open N/[name] N/[name] Wound Status: No N/[name] N/[name] Wound Recurrence: 1.8x2.5x0.1 N/[name] N/[name] Measurements L x W x D (cm) 3.534 N/[name] N/[name] [name] (cm) : rea 0.353 N/[name] N/[name] Volume (cm) : 75.00% N/[name] N/[name] % Reduction in [name] rea: 75.00% N/[name] N/[name] % Reduction in Volume: Grade 2 N/[name] N/[name] Classification: Medium N/[name] N/[name] Exudate [name] mount: Serosanguineous  N/[name] N/[name] Exudate Type: red, brown N/[name] N/[name] Exudate Color: Distinct, outline attached N/[name] N/[name] Wound Margin: Large (67-100%) N/[name] N/[name] Granulation [name] mount: Red, Pink N/[name] N/[name] Granulation Quality: Small (1-33%) N/[name] N/[name] Necrotic [name] mount: Fat Layer (Subcutaneous Tissue): Yes N/[name] N/[name] Exposed Structures: Fascia: No Tendon: No Muscle: No Joint: No Bone: No Medium (34-66%) N/[name] N/[name] Epithelialization: Debridement - Excisional N/[name] N/[name] Debridement: Pre-procedure Verification/Time Out 14:35 N/[name] N/[name] Taken: Lidocaine 4% T opical Solution N/[name] N/[name] Pain Control: Subcutaneous, Slough N/[name] N/[name] Tissue Debrided: Skin/Subcutaneous Tissue N/[name] N/[name] Level: 4.5 N/[name] N/[name] Debridement [name] (sq cm): rea Curette N/[name] N/[name] Instrument: Minimum N/[name] N/[name] Bleeding: Silver Nitrate N/[name] N/[name] Hemostasis [name] chieved: 0 N/[name] N/[name] Procedural Pain: 0 N/[name] N/[name] Post Procedural Pain: Procedure was tolerated well N/[name] N/[name] Debridement Treatment Response: 1.8x2.5x0.1 N/[name] N/[name] Post Debridement Measurements L x W x D (cm) 0.353 N/[name] N/[name] Post Debridement Volume: (cm) No Abnormalities Noted N/[name] N/[name] Periwound Skin Texture: Maceration: No N/[name] N/[name] Periwound Skin Moisture: Dry/Scaly: No No Abnormalities Noted N/[name] N/[name] Periwound Skin Color: No Abnormality N/[name] N/[name] Temperature: Compression Therapy N/[name] N/[name] Procedures Performed: Debridement Malstrom, Danisa [name] ([id number]) [id number] Page 4 of 8 Treatment Notes Wound #2 (Calcaneus) Wound Laterality: Left, Distal Cleanser Soap and Water Discharge Instruction: May shower and wash wound with dial antibacterial soap and water prior to dressing change. Wound Cleanser Discharge Instruction: Cleanse the wound with wound cleanser prior to applying [name] clean dressing using gauze sponges, not tissue or cotton balls. Peri-Wound Care Sween Lotion (Moisturizing lotion) Discharge Instruction: Apply moisturizing lotion as directed Topical Primary Dressing Hydrofera Blue  Ready Foam, 2.5 x2.5 in Discharge Instruction: apply over the santyl. Santyl Ointment Discharge Instruction: Apply under the hydrofera blue. Secondary Dressing ABD Pad, 8x10 Discharge Instruction: Apply over primary dressing as directed. Woven Gauze Sponge, Non-Sterile 4x4 in Discharge Instruction: Apply over primary dressing as directed. Secured With Compression Wrap ThreePress (3 layer compression wrap) Discharge Instruction: Apply three layer compression as directed. Compression Stockings Add-Ons Electronic Signature(s) Signed: 09/01/2022 3:09:28 PM By: Kalman Shan DO Entered By: Kalman Shan on 09/01/2022 14:44:11 -------------------------------------------------------------------------------- Multi-Disciplinary Care Plan Details Patient Name: Date of Service: [name]. 09/01/2022 2:00 PM Medical Record Number: [id number] Patient Account Number: [id number] Date of Birth/Sex: Treating RN: 02/16/1961 (61 y.o. [name] Primary Care Guerline Happ: Jenean Lindau Other Clinician: Referring Kanoa Phillippi: Treating Guiseppe Flanagan/Extender: Elise Benne in Treatment: 25 Active Inactive Abuse / Safety / Falls / Self Care Management Nursing Diagnoses: History of Falls Goals: Patient will not experience any injury related to falls Date Initiated: 03/05/2022 Target  Resolution Date: 11/06/2022 Goal Status: Active [place], [name] [phone] [id number]) [phone].pdf Page 5 of 8 Interventions: Assess fall risk on admission and as needed Assess: immobility, friction, shearing, incontinence upon admission and as needed Assess impairment of mobility on admission and as needed per policy Notes: 2/99/24: Fall prevention ongoing, recent fall. Wound/Skin Impairment Nursing Diagnoses: Impaired tissue integrity Goals: Patient/caregiver will verbalize understanding of skin care regimen Date Initiated: 03/05/2022 Target Resolution Date:  11/06/2022 Goal Status: Active Ulcer/skin breakdown will have [name] volume reduction of 30% by week 4 Date Initiated: 03/05/2022 Date Inactivated: 04/30/2022 Target Resolution Date: 05/02/2022 Goal Status: Met Ulcer/skin breakdown will have [name] volume reduction of 50% by week 8 Date Initiated: 04/30/2022 Date Inactivated: 07/07/2022 Target Resolution Date: 07/04/2022 Unmet Reason: patient sent three Goal Status: Unmet weeks inpatient and rehab before returning to wound center. Interventions: Assess patient/caregiver ability to obtain necessary supplies Assess patient/caregiver ability to perform ulcer/skin care regimen upon admission and as needed Assess ulceration(s) every visit Provide education on smoking Provide education on ulcer and skin care Treatment Activities: Topical wound management initiated : 03/05/2022 Notes: 04/30/22: Wound care regimen continues. Electronic Signature(s) Signed: 09/01/2022 4:47:08 PM By: Deon Pilling RN, BSN Entered By: Deon Pilling on 09/01/2022 14:36:47 -------------------------------------------------------------------------------- Pain Assessment Details Patient Name: Date of Service: [name] [name], [name]. 09/01/2022 2:00 PM Medical Record Number: [id number] Patient Account Number: [id number] Date of Birth/Sex: Treating RN: Mar 10, 1961 (61 y.o. F) Primary Care Ashlynn Gunnels: Jenean Lindau Other Clinician: Referring Lakeva Hollon: Treating Shamica Moree/Extender: Elise Benne in Treatment: 25 Active Problems Location of Pain Severity and Description of Pain Patient Has Paino Yes Site Locations Rate the pain. Eppolito, Ahaana [name] ([id number]) [id number] Page 6 of 8 Rate the pain. Current Pain Level: 6 Worst Pain Level: 10 Least Pain Level: 0 Tolerable Pain Level: 2 Pain Management and Medication Current Pain Management: Electronic Signature(s) Signed: 09/01/2022 2:11:10 PM By: Worthy Rancher Entered By: Worthy Rancher  on 09/01/2022 13:57:08 -------------------------------------------------------------------------------- Patient/Caregiver Education Details Patient Name: Date of Service: [name]. 11/28/2023andnbsp2:00 PM Medical Record Number: [id number] Patient Account Number: [id number] Date of Birth/Gender: Treating RN: 09-04-1961 (61 y.o. [name] Primary Care Physician: Jenean Lindau Other Clinician: Referring Physician: Treating Physician/Extender: Elise Benne in Treatment: 25 Education Assessment Education Provided To: Patient Education Topics Provided Wound/Skin Impairment: Handouts: Skin Care Do's and Dont's Methods: Explain/Verbal Responses: Reinforcements needed Electronic Signature(s) Signed: 09/01/2022 4:47:08 PM By: Deon Pilling RN, BSN Entered By: Deon Pilling on 09/01/2022 14:36:59 -------------------------------------------------------------------------------- Wound Assessment Details Patient Name: Date of Service: [name]. 09/01/2022 2:00 PM Medical Record Number: [id number] Patient Account Number: [id number] Date of Birth/Sex: Treating RN: [birth year] (61 y.o. F) Primary Care Jerah Esty: Jenean Lindau Other Clinician: Burney Gauze, [name] ([id number]) [id number] Page 7 of 8 Referring Macsen Nuttall: Treating Reis Goga/Extender: Elise Benne in Treatment: 25 Wound Status Wound Number: 2 Primary Diabetic Wound/Ulcer of the Lower Extremity Etiology: Wound Location: Left, Distal Calcaneus Wound Open Wounding Event: Blister Status: Date Acquired: 06/15/2022 Comorbid Anemia, Chronic Obstructive Pulmonary Disease (COPD), Weeks Of Treatment: 9 History: Congestive Heart Failure, Hypertension, Myocardial Infarction, Clustered Wound: No Peripheral Venous Disease, Type II Diabetes, Neuropathy Photos Wound Measurements Length: (cm) 1.8 Width: (cm) 2.5 Depth: (cm) 0.1 Area:  (cm) 3.534 Volume: (cm) 0.353 % Reduction in Area: 75% % Reduction in Volume: 75% Epithelialization: Medium (34-66%) Tunneling: No Undermining: No Wound Description Classification: Grade 2 Wound Margin: Distinct, outline attached Exudate Amount: Medium Exudate Type: Serosanguineous Exudate Color:  red, brown Foul Odor After Cleansing: No Slough/Fibrino Yes Wound Bed Granulation Amount: Large (67-100%) Exposed Structure Granulation Quality: Red, Pink Fascia Exposed: No Necrotic Amount: Small (1-33%) Fat Layer (Subcutaneous Tissue) Exposed: Yes Necrotic Quality: Adherent Slough Tendon Exposed: No Muscle Exposed: No Joint Exposed: No Bone Exposed: No Periwound Skin Texture Texture Color No Abnormalities Noted: Yes No Abnormalities Noted: Yes Moisture Temperature / Pain No Abnormalities Noted: No Temperature: No Abnormality Dry / Scaly: No Maceration: No Treatment Notes Wound #2 (Calcaneus) Wound Laterality: Left, Distal Cleanser Soap and Water Discharge Instruction: May shower and wash wound with dial antibacterial soap and water prior to dressing change. Wound Cleanser Discharge Instruction: Cleanse the wound with wound cleanser prior to applying [name] clean dressing using gauze sponges, not tissue or cotton balls. Peri-Wound Care Sween Lotion (Moisturizing lotion) Discharge Instruction: Apply moisturizing lotion as directed Topical Petta, [name] ([id number]) [id number] Page 8 of 8 Primary Dressing Hydrofera Blue Ready Foam, 2.5 x2.5 in Discharge Instruction: apply over the santyl. Santyl Ointment Discharge Instruction: Apply under the hydrofera blue. Secondary Dressing ABD Pad, 8x10 Discharge Instruction: Apply over primary dressing as directed. Woven Gauze Sponge, Non-Sterile 4x4 in Discharge Instruction: Apply over primary dressing as directed. Secured With Compression Wrap ThreePress (3 layer compression wrap) Discharge  Instruction: Apply three layer compression as directed. Compression Stockings Add-Ons Electronic Signature(s) Signed: 09/01/2022 4:47:08 PM By: Deon Pilling RN, BSN Previous Signature: 09/01/2022 2:11:10 PM Version By: Worthy Rancher Entered By: Deon Pilling on 09/01/2022 14:34:51 -------------------------------------------------------------------------------- Vitals Details Patient Name: Date of Service: [name], [name]. 09/01/2022 2:00 PM Medical Record Number: [id number] Patient Account Number: [id number] Date of Birth/Sex: Treating RN: January 31, 1961 (60 y.o. F) Primary Care Arath Kaigler: Jenean Lindau Other Clinician: Referring Adriyana Greenbaum: Treating Caprice Wasko/Extender: Elise Benne in Treatment: 25 Vital Signs Time Taken: 01:50 Temperature (F): 98.4 Height (in): 66 Pulse (bpm): 110 Weight (lbs): 339 Respiratory Rate (breaths/min): 20 Body Mass Index (BMI): 54.7 Blood Pressure (mmHg): 148/64 Capillary Blood Glucose (mg/dl): 201 Reference Range: 80 - 120 mg / dl Electronic Signature(s) Signed: 09/01/2022 2:11:10 PM By: Worthy Rancher Entered By: Worthy Rancher on 09/01/2022 13:56:45

## 2022-09-01 NOTE — Progress Notes (Signed)
Janvier, [name] ([id number]) [id number] Page 1 of 10 Visit Report for 09/01/2022 Chief Complaint Document Details Patient Name: Date of Service: [name]. 09/01/2022 2:00 PM Medical Record Number: [id number] Patient Account Number: [id number] Date of Birth/Sex: Treating RN: 04/22/1961 (61 y.o. F) Primary Care Provider: [name] Other Clinician: Referring Provider: Treating Provider/Extender: [name] in Treatment: 25 Information Obtained from: Patient Chief Complaint 03/05/2022; left foot wound Electronic Signature(s) Signed: 09/01/2022 3:09:28 PM By: Geralyn Corwin DO Entered By: Geralyn Corwin on 09/01/2022 14:44:24 -------------------------------------------------------------------------------- Debridement Details Patient Name: Date of Service: [name]. 09/01/2022 2:00 PM Medical Record Number: [id number] Patient Account Number: [id number] Date of Birth/Sex: Treating RN: 1961-01-02 (61 y.o. [name], [name] Primary Care Provider: [name] Other Clinician: Referring Provider: Treating Provider/Extender: [name] in Treatment: 25 Debridement Performed for Assessment: Wound #2 Left,Distal Calcaneus Performed By: Physician Geralyn Corwin, DO Debridement Type: Debridement Severity of Tissue Pre Debridement: Fat layer exposed Level of Consciousness (Pre-procedure): Awake and Alert Pre-procedure Verification/Time Out Yes - 14:35 Taken: Start Time: 14:36 Pain Control: Lidocaine 4% T opical Solution T Area Debrided (L x W): otal 1.8 (cm) x 2.5 (cm) = 4.5 (cm) Tissue and other material debrided: Viable, Non-Viable, Slough, Subcutaneous, Skin: Dermis , Skin: Epidermis, Slough Level: Skin/Subcutaneous Tissue Debridement Description: Excisional Instrument: Curette Bleeding: Minimum Hemostasis Achieved: Silver Nitrate End Time: 14:37 Procedural Pain:  0 Post Procedural Pain: 0 Response to Treatment: Procedure was tolerated well Level of Consciousness (Post- Awake and Alert procedure): Post Debridement Measurements of Total Wound Length: (cm) 1.8 Width: (cm) 2.5 Depth: (cm) 0.1 Volume: (cm) 0.353 Character of Wound/Ulcer Post Debridement: Improved Severity of Tissue Post Debridement: Fat layer exposed Royal, [name] ([id number]) [id number] Page 2 of 10 Post Procedure Diagnosis Same as Pre-procedure Electronic Signature(s) Signed: 09/01/2022 3:09:28 PM By: Geralyn Corwin DO Signed: 09/01/2022 4:47:08 PM By: Shawn Stall RN, BSN Entered By: Shawn Stall on 09/01/2022 14:38:01 -------------------------------------------------------------------------------- HPI Details Patient Name: Date of Service: [name], [name]. 09/01/2022 2:00 PM Medical Record Number: [id number] Patient Account Number: [id number] Date of Birth/Sex: Treating RN: [birth year] (61 y.o. F) Primary Care Provider: [name] Other Clinician: Referring Provider: Treating Provider/Extender: [name] in Treatment: 25 History of Present Illness HPI Description: Admission 03/05/2022 [name] is [name] 61 year old female with [name] past medical history of uncontrolled insulin-dependent type 2 diabetes, tobacco user and chronic diastolic heart failure that presents to the clinic for [name] 68-month history of wound to her left heel. She states she had [name] left ankle fusion in September 2022. She states that she always had [name] wound after the surgery and it never healed. She has home health and they have been doing compression wraps along with silver alginate to the wound bed. She currently denies signs of infection. 6/8; this is [name] 61 year old woman with type 2 diabetes. She developed [name] wound on her left Achilles heel just above the tip of the heel in the setting of recurrent ankle surgeries in late 2022. I have seen  some of these results from either the cast or the surgical boots that are put on after these operations. She thinks this may be the case. And [name] sense of pressure ulcer. In any case that she has been using Santyl Hydrofera Blue under compression. She is not wearing any footwear at home as she cannot find anything to accommodate the wrap 6/22; patient presents for follow-up. She has home  health that comes out once [name] week to help with dressing changes. She has no issues or complaints today. We have been using Hydrofera Blue under compression therapy. 7/6; patient presents for follow-up. She states that home health did not come out for the past 2 weeks. It is unclear why. We have been using Hydrofera Blue and Santyl under compression therapy. 7/13; patient presents for follow-up. She did not take the oral antibiotics prescribed at last clinic visit. We have been using Hydrofera Blue with gentamicin/mupirocin ointment under compression therapy. She has no issues or complaints today. She denies signs of infection. 7/20; patient presents for follow-up. We have been using Hydrofera Blue with antibiotic ointment under 3 layer compression. She has home health that change the dressing once. They put collagen on the wound bed. She also reports falling yesterday and hitting her right foot. She has no pain to the area today. 7/27; patient presents for follow-up. We have been using Hydrofera Blue under 3 layer compression. She has home health that changes the dressing. She has no issues or complaints today. 8/3; patient presents for follow-up. We continues to use Hydrofera Blue under 3 layer compression. She has no issues or complaints today. She has been approved for Epicord at 100%. 8/11; patient presents for follow-up. We have been using Hydrofera Blue under 3 layer compression. She has been approved for Epicord and we have this today. She is in agreement with having this applied. 8/18; patient presents for  follow-up. Epicord #1 was placed in standard fashion last clinic visit. She has no issues or complaints today. 9/18; Unfortunately patient has missed her last clinic appointments due to falling and breaking her right ankle. We have been following her for her left ankle wound. She has also developed [name] large blister to the left heel over the past week that has ruptured and dried. She currently resides In [facility]. Wound9/25; patient presents for follow-up. We have been using Hydrofera Blue and Santyl to the original left ankle wound and Xeroform to the dried blistered. We have been wrapping her with compression therapy. She resides in [name] facility and they changed the wrap once this past week. She has no issues or complaints today. She denies signs of infection. 10/3; patient presents for follow-up. We have been using Xeroform to the heel wound and Epicort to the left ankle wound All under compression therapy. She has no issues or complaints today. 10/10; patient presents for follow-up. We have been using Epicort to the left ankle wound and Santyl and Hydrofera Blue to the heel wound. All under compression therapy. she has no issues or complaints today. 10/16; patient presents for follow-up. Epicort was placed in standard fashion to the superior wound and onto the heel and Santyl and Hydrofera Blue. She states that the wrap got wet during her shower and her facility was able to rewrap with Kerlix/Coban. 10/23; patient presents for follow-up. Epicord was placed to the wound beds last clinic visit. We continue Kerlix/Coban. She has no issues or complaints today. 10/31; Patient presents for follow-up. Epicord was placed to the wound beds last clinic visit. The original wound is almost healed. We have done this under Kerlix/Coban. She denies signs of infection. [name], [name] ([id number]) [id number] Page 3 of 10 11/10; patient presents for follow-up. Patient's last epi cord  was placed in standard fashion at last clinic visit. The original wound has healed. She still has [name] heel wound. Grafix was approved however too costly for the patient. We  will rerun epi cord to see if she can have more applications. She had done very well with this. We have been using Hydrofera Blue to the heel under Kerlix/Coban. 11/21; patient presents for follow-up. We have been using Hydrofera Blue and Santyl to the heel under Kerlix/Coban. She switched to [name] new healthcare insurance and again The skin substitute is too costly for the patient. She denies signs of infection. 11/28; patient presents for follow-up. Her facility did not change the wrap last clinic visit due to lack of staffing. The wrap was taken off and Hydrofera Blue dressing has been in place for the past week. She has no issues or complaints today. Electronic Signature(s) Signed: 09/01/2022 3:09:28 PM By: Geralyn Corwin DO Entered By: Geralyn Corwin on 09/01/2022 14:45:23 -------------------------------------------------------------------------------- Physical Exam Details Patient Name: Date of Service: [name], [name]. 09/01/2022 2:00 PM Medical Record Number: [id number] Patient Account Number: [id number] Date of Birth/Sex: Treating RN: 1961-03-28 (61 y.o. F) Primary Care Provider: [name] Other Clinician: Referring Provider: Treating Provider/Extender: [name] in Treatment: 25 Constitutional respirations regular, non-labored and within target range for patient.. Cardiovascular 2+ dorsalis pedis/posterior tibialis pulses. Psychiatric pleasant and cooperative. Notes Left foot: The heel wound has granulation tissue, hypergranulated areas and slough. No surrounding signs of infection. 2+ pitting edema to the knee. Electronic Signature(s) Signed: 09/01/2022 3:09:28 PM By: Geralyn Corwin DO Entered By: Geralyn Corwin on 09/01/2022  14:45:51 -------------------------------------------------------------------------------- Physician Orders Details Patient Name: Date of Service: [name], [name]. 09/01/2022 2:00 PM Medical Record Number: [id number] Patient Account Number: [id number] Date of Birth/Sex: Treating RN: 09/18/1961 (61 y.o. [name] Primary Care Provider: [name] Other Clinician: Referring Provider: Treating Provider/Extender: [name] in Treatment: 25 Verbal / Phone Orders: No Diagnosis Coding ICD-10 Coding Code Description [phone] Non-pressure chronic ulcer of other part of left foot with fat layer exposed E11.621 Type 2 diabetes mellitus with foot ulcer E66.01 Morbid (severe) obesity due to excess calories Follow-up Appointments Downie, Surie [name] ([id number]) [id number] Page 4 of 10 ppointment in 1 week. - Dr. Mikey Bussing Return [name] ppointment in 2 weeks. - Dr. Mikey Bussing Return [name] Other: - facility to continue to change weekly and wound center to change weekly. Anesthetic (In clinic) Topical Lidocaine 5% applied to wound bed Cellular or Tissue Based Products Cellular or Tissue Based Product Type: - Run IVR for EpiFix and Epicord=100% covered 05/07/22: Epicord ordered daptic or Mepitel. (DO NOT REMOVE). - Cellular or Tissue Based Product applied to wound bed, secured with steri-strips, cover with [name] Epicord #1 05/15/25 Epicord #2 05/22/22 Epicord #3 06/29/2022 Epicord #4 07/07/2022 Epifix #5 07/14/22 Epicord #6 07/20/22 Epicord # 7 07/27/22 ***Run for Grafix***PENDING Epicord #8 08/04/2022 Bathing/ Shower/ Hygiene May shower with protection but do not get wound dressing(s) wet. - May use cast protector bag from [facility], [facility] or [employer] Edema Control - Lymphedema / SCD / Other Elevate legs to the level of the heart or above for 30 minutes daily and/or when sitting, [name] frequency of: Avoid standing for long periods of  time. Off-Loading Prevalon Boot - use while in bed and chair. Additional Orders / Instructions Follow Nutritious Diet - Monitor/Control Blood Sugar Wound Treatment Wound #2 - Calcaneus Wound Laterality: Left, Distal Cleanser: Soap and Water 2 x Per Week/30 Days Discharge Instructions: May shower and wash wound with dial antibacterial soap and water prior to dressing change. Cleanser: Wound Cleanser 2 x Per Week/30 Days Discharge Instructions: Cleanse the wound with  wound cleanser prior to applying [name] clean dressing using gauze sponges, not tissue or cotton balls. Peri-Wound Care: Sween Lotion (Moisturizing lotion) 2 x Per Week/30 Days Discharge Instructions: Apply moisturizing lotion as directed Prim Dressing: Hydrofera Blue Ready Foam, 2.5 x2.5 in 2 x Per Week/30 Days ary Discharge Instructions: apply over the santyl. Prim Dressing: Santyl Ointment 2 x Per Week/30 Days ary Discharge Instructions: Apply under the hydrofera blue. Secondary Dressing: ABD Pad, 8x10 2 x Per Week/30 Days Discharge Instructions: Apply over primary dressing as directed. Secondary Dressing: Woven Gauze Sponge, Non-Sterile 4x4 in (Home Health) 2 x Per Week/30 Days Discharge Instructions: Apply over primary dressing as directed. Compression Wrap: ThreePress (3 layer compression wrap) (Home Health) 2 x Per Week/30 Days Discharge Instructions: Apply three layer compression as directed. Electronic Signature(s) Signed: 09/01/2022 3:09:28 PM By: Geralyn Corwin DO Entered By: Geralyn Corwin on 09/01/2022 14:46:00 -------------------------------------------------------------------------------- Problem List Details Patient Name: Date of Service: [name], [name]. 09/01/2022 2:00 PM Medical Record Number: [id number] Patient Account Number: [id number] Date of Birth/Sex: Treating RN: June 03, 1961 (61 y.o. [name] Rudy, Verity [name] ([id number]) [id number] Page 5 of 10 Primary Care  Provider: [name] Other Clinician: Referring Provider: Treating Provider/Extender: [name] in Treatment: 25 Active Problems ICD-10 Encounter Code Description Active Date MDM Diagnosis L97.522 Non-pressure chronic ulcer of other part of left foot with fat layer exposed 03/05/2022 No Yes E11.621 Type 2 diabetes mellitus with foot ulcer 03/05/2022 No Yes E66.01 Morbid (severe) obesity due to excess calories 03/05/2022 No Yes Inactive Problems Resolved Problems Electronic Signature(s) Signed: 09/01/2022 3:09:28 PM By: Geralyn Corwin DO Entered By: Geralyn Corwin on 09/01/2022 14:43:58 -------------------------------------------------------------------------------- Progress Note Details Patient Name: Date of Service: [name]. 09/01/2022 2:00 PM Medical Record Number: [id number] Patient Account Number: [id number] Date of Birth/Sex: Treating RN: 12/21/1960 (61 y.o. F) Primary Care Provider: [name] Other Clinician: Referring Provider: Treating Provider/Extender: [name] in Treatment: 25 Subjective Chief Complaint Information obtained from Patient 03/05/2022; left foot wound History of Present Illness (HPI) Admission 03/05/2022 Ms. Maegen Limburg is [name] 61 year old female with [name] past medical history of uncontrolled insulin-dependent type 2 diabetes, tobacco user and chronic diastolic heart failure that presents to the clinic for [name] 76-month history of wound to her left heel. She states she had [name] left ankle fusion in September 2022. She states that she always had [name] wound after the surgery and it never healed. She has home health and they have been doing compression wraps along with silver alginate to the wound bed. She currently denies signs of infection. 6/8; this is [name] 61 year old woman with type 2 diabetes. She developed [name] wound on her left Achilles heel just above the tip of the heel in the setting of  recurrent ankle surgeries in late 2022. I have seen some of these results from either the cast or the surgical boots that are put on after these operations. She thinks this may be the case. And [name] sense of pressure ulcer. In any case that she has been using Santyl Hydrofera Blue under compression. She is not wearing any footwear at home as she cannot find anything to accommodate the wrap 6/22; patient presents for follow-up. She has home health that comes out once [name] week to help with dressing changes. She has no issues or complaints today. We have been using Hydrofera Blue under compression therapy. 7/6; patient presents for follow-up. She states that home health did not come  out for the past 2 weeks. It is unclear why. We have been using Hydrofera Blue and Santyl under compression therapy. 7/13; patient presents for follow-up. She did not take the oral antibiotics prescribed at last clinic visit. We have been using Hydrofera Blue with gentamicin/mupirocin ointment under compression therapy. She has no issues or complaints today. She denies signs of infection. 7/20; patient presents for follow-up. We have been using Hydrofera Blue with antibiotic ointment under 3 layer compression. She has home health that change the dressing once. They put collagen on the wound bed. She also reports falling yesterday and hitting her right foot. She has no pain to the area today. 7/27; patient presents for follow-up. We have been using Hydrofera Blue under 3 layer compression. She has home health that changes the dressing. She has [name], [name] ([id number]) [id number] Page 6 of 10 no issues or complaints today. 8/3; patient presents for follow-up. We continues to use Hydrofera Blue under 3 layer compression. She has no issues or complaints today. She has been approved for Epicord at 100%. 8/11; patient presents for follow-up. We have been using Hydrofera Blue under 3 layer compression.  She has been approved for Epicord and we have this today. She is in agreement with having this applied. 8/18; patient presents for follow-up. Epicord #1 was placed in standard fashion last clinic visit. She has no issues or complaints today. 9/18; Unfortunately patient has missed her last clinic appointments due to falling and breaking her right ankle. We have been following her for her left ankle wound. She has also developed [name] large blister to the left heel over the past week that has ruptured and dried. [facility] Wound9/25; patient presents for follow-up. We have been using Hydrofera Blue and Santyl to the original left ankle wound and Xeroform to the dried blistered. We have been wrapping her with compression therapy. She resides in [name] facility and they changed the wrap once this past week. She has no issues or complaints today. She denies signs of infection. 10/3; patient presents for follow-up. We have been using Xeroform to the heel wound and Epicort to the left ankle wound All under compression therapy. She has no issues or complaints today. 10/10; patient presents for follow-up. We have been using Epicort to the left ankle wound and Santyl and Hydrofera Blue to the heel wound. All under compression therapy. she has no issues or complaints today. 10/16; patient presents for follow-up. Epicort was placed in standard fashion to the superior wound and onto the heel and Santyl and Hydrofera Blue. She states that the wrap got wet during her shower and her facility was able to rewrap with Kerlix/Coban. 10/23; patient presents for follow-up. Epicord was placed to the wound beds last clinic visit. We continue Kerlix/Coban. She has no issues or complaints today. 10/31; Patient presents for follow-up. Epicord was placed to the wound beds last clinic visit. The original wound is almost healed. We have done this under Kerlix/Coban. She denies signs of infection. 11/10;  patient presents for follow-up. Patient's last epi cord was placed in standard fashion at last clinic visit. The original wound has healed. She still has [name] heel wound. Grafix was approved however too costly for the patient. We will rerun epi cord to see if she can have more applications. She had done very well with this. We have been using Hydrofera Blue to the heel under Kerlix/Coban. 11/21; patient presents for follow-up. We have been using Hydrofera Blue  and Santyl to the heel under Kerlix/Coban. She switched to [name] new healthcare insurance and again The skin substitute is too costly for the patient. She denies signs of infection. 11/28; patient presents for follow-up. Her facility did not change the wrap last clinic visit due to lack of staffing. The wrap was taken off and Hydrofera Blue dressing has been in place for the past week. She has no issues or complaints today. Patient History Information obtained from Patient, Chart. Family History Cancer - Father,Mother, Diabetes - Mother,Maternal Grandparents,Paternal Grandparents, Heart Disease - Siblings,Maternal Grandparents, Hypertension - Siblings, Tuberculosis - Maternal Grandparents,Paternal Grandparents. Social History Current every day smoker, Marital Status - Single, Alcohol Use - Never, Drug Use - No History, Caffeine Use - Daily. Medical History Hematologic/Lymphatic Patient has history of Anemia Respiratory Patient has history of Chronic Obstructive Pulmonary Disease (COPD) Cardiovascular Patient has history of Congestive Heart Failure - Weighs self daily, Hypertension, Myocardial Infarction, Peripheral Venous Disease Endocrine Patient has history of Type II Diabetes Neurologic Patient has history of Neuropathy Medical [name] Surgical History Notes nd Gastrointestinal GERD, Peptic Ulcer Disease Musculoskeletal Broke left leg 3 years ago, 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, foot  fused Psychiatric Depression Objective Constitutional respirations regular, non-labored and within target range for patient.. Vitals Time Taken: 1:50 AM, Height: 66 in, Weight: 339 lbs, BMI: 54.7, Temperature: 98.4 F, Pulse: 110 bpm, Respiratory Rate: 20 breaths/min, Blood Pressure: 148/64 mmHg, Capillary Blood Glucose: 201 mg/dl. [name], [name] ([id number]) [id number] Page 7 of 10 Cardiovascular 2+ dorsalis pedis/posterior tibialis pulses. Psychiatric pleasant and cooperative. General Notes: Left foot: The heel wound has granulation tissue, hypergranulated areas and slough. No surrounding signs of infection. 2+ pitting edema to the knee. Integumentary (Hair, Skin) Wound #2 status is Open. Original cause of wound was Blister. The date acquired was: 06/15/2022. The wound has been in treatment 9 weeks. The wound is located on the Left,Distal Calcaneus. The wound measures 1.8cm length x 2.5cm width x 0.1cm depth; 3.534cm^2 area and 0.353cm^3 volume. There is Fat Layer (Subcutaneous Tissue) exposed. There is no tunneling or undermining noted. There is [name] medium amount of serosanguineous drainage noted. The wound margin is distinct with the outline attached to the wound base. There is large (67-100%) red, pink granulation within the wound bed. There is [name] small (1-33%) amount of necrotic tissue within the wound bed including Adherent Slough. The periwound skin appearance had no abnormalities noted for texture. The periwound skin appearance had no abnormalities noted for color. The periwound skin appearance did not exhibit: Dry/Scaly, Maceration. Periwound temperature was noted as No Abnormality. Assessment Active Problems ICD-10 Non-pressure chronic ulcer of other part of left foot with fat layer exposed Type 2 diabetes mellitus with foot ulcer Morbid (severe) obesity due to excess calories Patient's wound has shown improvement in size and appearance since last  clinic visit. I debrided nonviable tissue and use silver nitrate to the hyper granulated areas. I recommended continuing the course with Santyl and Hydrofera Blue under compression therapy. Follow-up in 1 week. Procedures Wound #2 Pre-procedure diagnosis of Wound #2 is [name] Diabetic Wound/Ulcer of the Lower Extremity located on the Left,Distal Calcaneus .Severity of Tissue Pre Debridement is: Fat layer exposed. There was [name] Excisional Skin/Subcutaneous Tissue Debridement with [name] total area of 4.5 sq cm performed by Geralyn Corwin, DO. With the following instrument(s): Curette to remove Viable and Non-Viable tissue/material. Material removed includes Subcutaneous Tissue, Slough, Skin: Dermis, and Skin: Epidermis after achieving pain control using Lidocaine 4% T opical Solution. [name] time out  was conducted at 14:35, prior to the start of the procedure. [name] Minimum amount of bleeding was controlled with Silver Nitrate. The procedure was tolerated well with [name] pain level of 0 throughout and [name] pain level of 0 following the procedure. Post Debridement Measurements: 1.8cm length x 2.5cm width x 0.1cm depth; 0.353cm^3 volume. Character of Wound/Ulcer Post Debridement is improved. Severity of Tissue Post Debridement is: Fat layer exposed. 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 There was [name] Three Layer Compression Therapy Procedure by Shawn Stall, RN. Post procedure Diagnosis Wound #2: Same as Pre-Procedure Plan Follow-up Appointments: Return Appointment in 1 week. - Dr. Mikey Bussing Return Appointment in 2 weeks. - Dr. Mikey Bussing Other: - facility to continue to change weekly and wound center to change weekly. Anesthetic: (In clinic) Topical Lidocaine 5% applied to wound bed Cellular or Tissue Based Products: Cellular or Tissue Based Product Type: - Run IVR for EpiFix and  Epicord=100% covered 05/07/22: Epicord ordered Cellular or Tissue Based Product applied to wound bed, secured with steri-strips, cover with Adaptic or Mepitel. (DO NOT REMOVE). - Epicord #1 05/15/25 Epicord #2 05/22/22 Epicord #3 06/29/2022 Epicord #4 07/07/2022 Epifix #5 07/14/22 Epicord #6 07/20/22 Epicord # 7 07/27/22 ***Run for Grafix***PENDING Epicord #8 08/04/2022 Bathing/ Shower/ Hygiene: May shower with protection but do not get wound dressing(s) wet. - May use cast protector bag from [facility], [facility] or [employer] Edema Control - Lymphedema / SCD / Other: Elevate legs to the level of the heart or above for 30 minutes daily and/or when sitting, [name] frequency of: Avoid standing for long periods of time. Off-Loading: Prevalon Boot - use while in bed and chair. Additional Orders / Instructions: Follow Nutritious Diet - Monitor/Control Blood Sugar WOUND #2: - Calcaneus Wound Laterality: Left, Distal Cleanser: Soap and Water 2 x Per Week/30 Days Discharge Instructions: May shower and wash wound with dial antibacterial soap and water prior to dressing change. Cleanser: Wound Cleanser 2 x Per Week/30 Days [name], [name] ([id number]) [phone].pdf Page 8 of 10 Discharge Instructions: Cleanse the wound with wound cleanser prior to applying [name] clean dressing using gauze sponges, not tissue or cotton balls. Peri-Wound Care: Sween Lotion (Moisturizing lotion) 2 x Per Week/30 Days Discharge Instructions: Apply moisturizing lotion as directed Prim Dressing: Hydrofera Blue Ready Foam, 2.5 x2.5 in 2 x Per Week/30 Days ary Discharge Instructions: apply over the santyl. Prim Dressing: Santyl Ointment 2 x Per Week/30 Days ary Discharge Instructions: Apply under the hydrofera blue. Secondary Dressing: ABD Pad, 8x10 2 x Per Week/30 Days Discharge Instructions: Apply over primary dressing as directed. Secondary Dressing: Woven Gauze Sponge, Non-Sterile 4x4 in (Home Health) 2 x Per Week/30  Days Discharge Instructions: Apply over primary dressing as directed. Com pression Wrap: ThreePress (3 layer compression wrap) (Home Health) 2 x Per Week/30 Days Discharge Instructions: Apply three layer compression as directed. 1. In office sharp debridement 2. Silver nitrate 3. Hydrofera Blue and Santyl under 3 layer compression 4. Follow-up in 1 week Electronic Signature(s) Signed: 09/01/2022 3:09:28 PM By: Geralyn Corwin DO Entered By: Geralyn Corwin on 09/01/2022 14:47:08 -------------------------------------------------------------------------------- HxROS Details Patient Name: Date of Service: [name], [name]. 09/01/2022 2:00 PM Medical Record Number: [id number] Patient Account Number: [id number] Date of Birth/Sex: Treating RN: [birth year] (61 y.o. F) Primary Care Provider: [name] Other Clinician: Referring Provider: Treating Provider/Extender: [name] in Treatment: 25 Information  Obtained From Patient Chart Hematologic/Lymphatic Medical History: Positive for: Anemia Respiratory Medical History: Positive for: Chronic Obstructive Pulmonary Disease (COPD) Cardiovascular Medical History: Positive for: Congestive Heart Failure - Weighs self daily; Hypertension; Myocardial Infarction; Peripheral Venous Disease Gastrointestinal Medical History: Past Medical History Notes: GERD, Peptic Ulcer Disease Endocrine Medical History: Positive for: Type II Diabetes Time with diabetes: 16 years Treated with: Insulin, Oral agents Blood sugar tested every day: Yes Tested : Twice daily Musculoskeletal Medical History: Past Medical History Notes: Babinski, Shyne [name] ([id number]) [id number] Page 9 of 10 Broke left leg 3 years ago, 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, foot fused Neurologic Medical History: Positive for: Neuropathy Psychiatric Medical History: Past Medical History  Notes: Depression Immunizations Pneumococcal Vaccine: Received Pneumococcal Vaccination: No Implantable Devices None Family and Social History Cancer: Yes - Father,Mother; Diabetes: Yes - Mother,Maternal Grandparents,Paternal Grandparents; Heart Disease: Yes - Siblings,Maternal Grandparents; Hypertension: Yes - Siblings; Tuberculosis: Yes - Maternal Grandparents,Paternal Grandparents; Current every day smoker; Marital Status - Single; Alcohol Use: Never; Drug Use: No History; Caffeine Use: Daily; Financial Concerns: No; Food, Clothing or Shelter Needs: No; Support System Lacking: No; Transportation Concerns: No Electronic Signature(s) Signed: 09/01/2022 3:09:28 PM By: Geralyn Corwin DO Entered By: Geralyn Corwin on 09/01/2022 14:45:28 -------------------------------------------------------------------------------- SuperBill Details Patient Name: Date of Service: [name]. 09/01/2022 Medical Record Number: [id number] Patient Account Number: [id number] Date of Birth/Sex: Treating RN: 06/25/1961 (61 y.o. [name], [name] Primary Care Provider: [name] Other Clinician: Referring Provider: Treating Provider/Extender: [name] in Treatment: 25 Diagnosis Coding ICD-10 Codes Code Description [phone] Non-pressure chronic ulcer of other part of left foot with fat layer exposed E11.621 Type 2 diabetes mellitus with foot ulcer E66.01 Morbid (severe) obesity due to excess calories Facility Procedures : CPT4 Code: [id number] Description: 11042 - DEB SUBQ TISSUE 20 SQ CM/< ICD-10 Diagnosis Description L97.522 Non-pressure chronic ulcer of other part of left foot with fat layer exposed Modifier: Quantity: 1 Physician Procedures : CPT4 Code Description Modifier [id number] 11042 - WC PHYS SUBQ TISS 20 SQ CM ICD-10 Diagnosis Description L97.522 Non-pressure chronic ulcer of other part of left foot with fat layer exposed Quantity: 1 Electronic  Signature(s) Homann, Kendria [name] ([id number]) [id number] Page 10 of 10 Signed: 09/01/2022 3:09:28 PM By: Geralyn Corwin DO Entered By: Geralyn Corwin on 09/01/2022 14:47:16

## 2022-09-08 ENCOUNTER — Encounter (HOSPITAL_BASED_OUTPATIENT_CLINIC_OR_DEPARTMENT_OTHER): Payer: 59 | Attending: Internal Medicine | Admitting: Internal Medicine

## 2022-09-08 DIAGNOSIS — Z8711 Personal history of peptic ulcer disease: Secondary | ICD-10-CM | POA: Insufficient documentation

## 2022-09-08 DIAGNOSIS — E1140 Type 2 diabetes mellitus with diabetic neuropathy, unspecified: Secondary | ICD-10-CM | POA: Diagnosis not present

## 2022-09-08 DIAGNOSIS — J449 Chronic obstructive pulmonary disease, unspecified: Secondary | ICD-10-CM | POA: Insufficient documentation

## 2022-09-08 DIAGNOSIS — Z7984 Long term (current) use of oral hypoglycemic drugs: Secondary | ICD-10-CM | POA: Diagnosis not present

## 2022-09-08 DIAGNOSIS — K219 Gastro-esophageal reflux disease without esophagitis: Secondary | ICD-10-CM | POA: Insufficient documentation

## 2022-09-08 DIAGNOSIS — I252 Old myocardial infarction: Secondary | ICD-10-CM | POA: Diagnosis not present

## 2022-09-08 DIAGNOSIS — I5032 Chronic diastolic (congestive) heart failure: Secondary | ICD-10-CM | POA: Insufficient documentation

## 2022-09-08 DIAGNOSIS — F17200 Nicotine dependence, unspecified, uncomplicated: Secondary | ICD-10-CM | POA: Diagnosis not present

## 2022-09-08 DIAGNOSIS — Z794 Long term (current) use of insulin: Secondary | ICD-10-CM | POA: Diagnosis not present

## 2022-09-08 DIAGNOSIS — L97522 Non-pressure chronic ulcer of other part of left foot with fat layer exposed: Secondary | ICD-10-CM | POA: Insufficient documentation

## 2022-09-08 DIAGNOSIS — Z6841 Body Mass Index (BMI) 40.0 and over, adult: Secondary | ICD-10-CM | POA: Diagnosis not present

## 2022-09-08 DIAGNOSIS — E11621 Type 2 diabetes mellitus with foot ulcer: Secondary | ICD-10-CM | POA: Insufficient documentation

## 2022-09-08 DIAGNOSIS — I110 Hypertensive heart disease with heart failure: Secondary | ICD-10-CM | POA: Insufficient documentation

## 2022-09-08 NOTE — Progress Notes (Signed)
Mancino, Brittani C ([id number]) [id number] Page 1 of 10 Visit Report for 09/08/2022 Chief Complaint Document Details Patient Name: Date of Service: [name] C. 09/08/2022 9:00 A M Medical Record Number: [id number] Patient Account Number: [id number] Date of Birth/Sex: Treating RN: 10/09/1960 (61 y.o. F) Primary Care Provider: [name] Other Clinician: Referring Provider: Treating Provider/Extender: [name] in Treatment: 26 Information Obtained from: Patient Chief Complaint 03/05/2022; left foot wound Electronic Signature(s) Signed: 09/08/2022 11:32:14 AM By: Geralyn Corwin DO Entered By: Geralyn Corwin on 09/08/2022 09:16:26 -------------------------------------------------------------------------------- Debridement Details Patient Name: Date of Service: [name] C. 09/08/2022 9:00 A M Medical Record Number: [id number] Patient Account Number: [id number] Date of Birth/Sex: Treating RN: 08/30/1961 (61 y.o. [name], [name] Primary Care Provider: [name] Other Clinician: Referring Provider: Treating Provider/Extender: [name] in Treatment: 26 Debridement Performed for Assessment: Wound #2 Left,Distal Calcaneus Performed By: Physician Geralyn Corwin, DO Debridement Type: Debridement Severity of Tissue Pre Debridement: Fat layer exposed Level of Consciousness (Pre-procedure): Awake and Alert Pre-procedure Verification/Time Out Yes - 09:00 Taken: Start Time: 09:01 Pain Control: Lidocaine 5% topical ointment T Area Debrided (L x W): otal 1.7 (cm) x 2.4 (cm) = 4.08 (cm) Tissue and other material debrided: Viable, Non-Viable, Slough, Slough Level: Non-Viable Tissue Debridement Description: Selective/Open Wound Instrument: Curette Bleeding: Minimum Hemostasis Achieved: Pressure End Time: 09:07 Procedural Pain: 0 Post Procedural Pain: 0 Response to Treatment:  Procedure was tolerated well Level of Consciousness (Post- Awake and Alert procedure): Post Debridement Measurements of Total Wound Length: (cm) 1.7 Width: (cm) 2.4 Depth: (cm) 0.1 Volume: (cm) 0.32 Character of Wound/Ulcer Post Debridement: Improved Severity of Tissue Post Debridement: Fat layer exposed Maille, Adelyn C ([id number]) [id number] Page 2 of 10 Post Procedure Diagnosis Same as Pre-procedure Electronic Signature(s) Signed: 09/08/2022 11:32:14 AM By: Geralyn Corwin DO Signed: 09/08/2022 3:03:04 PM By: Shawn Stall RN, BSN Entered By: Shawn Stall on 09/08/2022 09:08:13 -------------------------------------------------------------------------------- HPI Details Patient Name: Date of Service: [name], [name]. 09/08/2022 9:00 A M Medical Record Number: [id number] Patient Account Number: [id number] Date of Birth/Sex: Treating RN: Nov 22, 1960 (61 y.o. F) Primary Care Provider: [name] Other Clinician: Referring Provider: Treating Provider/Extender: [name] in Treatment: 26 History of Present Illness HPI Description: Admission 03/05/2022 [name] is a 61 year old female with a past medical history of uncontrolled insulin-dependent type 2 diabetes, tobacco user and chronic diastolic heart failure that presents to the clinic for a 22-month history of wound to her left heel. She states she had a left ankle fusion in September 2022. She states that she always had a wound after the surgery and it never healed. She has home health and they have been doing compression wraps along with silver alginate to the wound bed. She currently denies signs of infection. 6/8; this is a 61 year old woman with type 2 diabetes. She developed a wound on her left Achilles heel just above the tip of the heel in the setting of recurrent ankle surgeries in late 2022. I have seen some of these results from either the cast or the  surgical boots that are put on after these operations. She thinks this may be the case. And a sense of pressure ulcer. In any case that she has been using Santyl Hydrofera Blue under compression. She is not wearing any footwear at home as she cannot find anything to accommodate the wrap 6/22; patient presents for follow-up. She has home health that comes out  once a week to help with dressing changes. She has no issues or complaints today. We have been using Hydrofera Blue under compression therapy. 7/6; patient presents for follow-up. She states that home health did not come out for the past 2 weeks. It is unclear why. We have been using Hydrofera Blue and Santyl under compression therapy. 7/13; patient presents for follow-up. She did not take the oral antibiotics prescribed at last clinic visit. We have been using Hydrofera Blue with gentamicin/mupirocin ointment under compression therapy. She has no issues or complaints today. She denies signs of infection. 7/20; patient presents for follow-up. We have been using Hydrofera Blue with antibiotic ointment under 3 layer compression. She has home health that change the dressing once. They put collagen on the wound bed. She also reports falling yesterday and hitting her right foot. She has no pain to the area today. 7/27; patient presents for follow-up. We have been using Hydrofera Blue under 3 layer compression. She has home health that changes the dressing. She has no issues or complaints today. 8/3; patient presents for follow-up. We continues to use Hydrofera Blue under 3 layer compression. She has no issues or complaints today. She has been approved for Epicord at 100%. 8/11; patient presents for follow-up. We have been using Hydrofera Blue under 3 layer compression. She has been approved for Epicord and we have this today. She is in agreement with having this applied. 8/18; patient presents for follow-up. Epicord #1 was placed in standard fashion  last clinic visit. She has no issues or complaints today. 9/18; Unfortunately patient has missed her last clinic appointments due to falling and breaking her right ankle. We have been following her for her left ankle wound. She has also developed a large blister to the left heel over the past week that has ruptured and dried. She currently resides In [facility]. Wound9/25; patient presents for follow-up. We have been using Hydrofera Blue and Santyl to the original left ankle wound and Xeroform to the dried blistered. We have been wrapping her with compression therapy. She resides in a facility and they changed the wrap once this past week. She has no issues or complaints today. She denies signs of infection. 10/3; patient presents for follow-up. We have been using Xeroform to the heel wound and Epicort to the left ankle wound All under compression therapy. She has no issues or complaints today. 10/10; patient presents for follow-up. We have been using Epicort to the left ankle wound and Santyl and Hydrofera Blue to the heel wound. All under compression therapy. she has no issues or complaints today. 10/16; patient presents for follow-up. Epicort was placed in standard fashion to the superior wound and onto the heel and Santyl and Hydrofera Blue. She states that the wrap got wet during her shower and her facility was able to rewrap with Kerlix/Coban. 10/23; patient presents for follow-up. Epicord was placed to the wound beds last clinic visit. We continue Kerlix/Coban. She has no issues or complaints today. 10/31; Patient presents for follow-up. Epicord was placed to the wound beds last clinic visit. The original wound is almost healed. We have done this under Kerlix/Coban. She denies signs of infection. Jodoin, Ceasia C ([id number]) [id number] Page 3 of 10 11/10; patient presents for follow-up. Patient's last epi cord was placed in standard fashion at last clinic visit.  The original wound has healed. She still has a heel wound. Grafix was approved however too costly for the patient. We will rerun epi cord  to see if she can have more applications. She had done very well with this. We have been using Hydrofera Blue to the heel under Kerlix/Coban. 11/21; patient presents for follow-up. We have been using Hydrofera Blue and Santyl to the heel under Kerlix/Coban. She switched to a new healthcare insurance and again The skin substitute is too costly for the patient. She denies signs of infection. 11/28; patient presents for follow-up. Her facility did not change the wrap last clinic visit due to lack of staffing. The wrap was taken off and Hydrofera Blue dressing has been in place for the past week. She has no issues or complaints today. 12/5; patient presents for follow-up. She states she has had more drainage over the past week. They did not change the wrap at her facility. She states the nurse will be back this week. She is also been doing a lot of physical therapy. She has been using the Prevalon boot while in bed. Electronic Signature(s) Signed: 09/08/2022 11:32:14 AM By: Geralyn CorwinHoffman, Lily Kernen DO Entered By: Geralyn CorwinHoffman, Kazoua Gossen on 09/08/2022 09:36:06 -------------------------------------------------------------------------------- Physical Exam Details Patient Name: Date of Service: Sharol HarnessHUFFMA N, [name]. 09/08/2022 9:00 A M Medical Record Number: [id number] Patient Account Number: [id number] Date of Birth/Sex: Treating RN: 01/04/1961 (61 y.o. F) Primary Care Provider: Junious Dresserampbell, Stephen Other Clinician: Referring Provider: Treating Provider/Extender: Andree MoroHoffman, Kade Rickels Campbell, Stephen Weeks in Treatment: 26 Constitutional respirations regular, non-labored and within target range for patient.. Cardiovascular 2+ dorsalis pedis/posterior tibialis pulses. Psychiatric pleasant and cooperative. Notes Left foot: T the heel wound there is dark and granulation tissue with  slough. No surrounding signs of infection. 2+ pitting edema to the knee. o Electronic Signature(s) Signed: 09/08/2022 11:32:14 AM By: Geralyn CorwinHoffman, Nikkol Pai DO Entered By: Geralyn CorwinHoffman, Hermina Barnard on 09/08/2022 09:36:45 -------------------------------------------------------------------------------- Physician Orders Details Patient Name: Date of Service: [name] BushyHUFFMA N, [name]. 09/08/2022 9:00 A M Medical Record Number: [id number] Patient Account Number: [id number] Date of Birth/Sex: Treating RN: 01/04/1961 (61 y.o. Debara [name]) [name], [name] Primary Care Provider: Junious Dresserampbell, Stephen Other Clinician: Referring Provider: Treating Provider/Extender: Andree MoroHoffman, Nikesha Kwasny Campbell, Stephen Weeks in Treatment: 26 Verbal / Phone Orders: No Diagnosis Coding ICD-10 Coding Code Description [phone] Non-pressure chronic ulcer of other part of left foot with fat layer exposed E11.621 Type 2 diabetes mellitus with foot ulcer E66.01 Morbid (severe) obesity due to excess calories Bache, Zaya C ([id number]) [id number] Page 4 of 10 Follow-up Appointments ppointment in 1 week. - Dr. Mikey BussingHoffman Return A ppointment in 2 weeks. - Dr. Mikey BussingHoffman Return A Other: - facility to continue to change weekly and wound center to change weekly. Physical Therapy to be mindful no pressure to left heel wound. Patient to use more prevalon boot in bed and wheelchair. Do not transfer when in wheelchair. Anesthetic (In clinic) Topical Lidocaine 5% applied to wound bed Cellular or Tissue Based Products Cellular or Tissue Based Product Type: - Run IVR for EpiFix and Epicord=100% covered 05/07/22: Epicord ordered daptic or Mepitel. (DO NOT REMOVE). - Cellular or Tissue Based Product applied to wound bed, secured with steri-strips, cover with A Epicord #1 05/15/25 Epicord #2 05/22/22 Epicord #3 06/29/2022 Epicord #4 07/07/2022 Epifix #5 07/14/22 Epicord #6 07/20/22 Epicord # 7 07/27/22 ***Run for Grafix***PENDING Epicord #8  08/04/2022 Bathing/ Shower/ Hygiene May shower with protection but do not get wound dressing(s) wet. - May use cast protector bag from [facility], [facility] or [employer] Edema Control - Lymphedema / SCD / Other Elevate legs to the level of the heart or above for 30 minutes daily and/or when  sitting, a frequency of: Avoid standing for long periods of time. Off-Loading Prevalon Boot - use while in bed and chair. Additional Orders / Instructions Follow Nutritious Diet - Monitor/Control Blood Sugar Wound Treatment Wound #2 - Calcaneus Wound Laterality: Left, Distal Cleanser: Soap and Water 2 x Per Week/30 Days Discharge Instructions: May shower and wash wound with dial antibacterial soap and water prior to dressing change. Cleanser: Wound Cleanser 2 x Per Week/30 Days Discharge Instructions: Cleanse the wound with wound cleanser prior to applying a clean dressing using gauze sponges, not tissue or cotton balls. Peri-Wound Care: Sween Lotion (Moisturizing lotion) 2 x Per Week/30 Days Discharge Instructions: Apply moisturizing lotion as directed Topical: Gentamicin 2 x Per Week/30 Days Discharge Instructions: apply only in clinic directly to wound bed. Topical: Mupirocin Ointment 2 x Per Week/30 Days Discharge Instructions: apply only in clinic directly to wound bed. Prim Dressing: Hydrofera Blue Ready Foam, 2.5 x2.5 in 2 x Per Week/30 Days ary Discharge Instructions: apply over the santyl. Secondary Dressing: ABD Pad, 8x10 2 x Per Week/30 Days Discharge Instructions: Apply over primary dressing as directed. Secondary Dressing: Woven Gauze Sponge, Non-Sterile 4x4 in (Home Health) 2 x Per Week/30 Days Discharge Instructions: Apply over primary dressing as directed. Compression Wrap: FourPress (4 layer compression wrap) 2 x Per Week/30 Days Discharge Instructions: Apply four layer compression as directed. May also use Miliken CoFlex 2 layer compression system as alternative. Electronic  Signature(s) Signed: 09/08/2022 11:32:14 AM By: Geralyn Corwin DO Entered By: Geralyn Corwin on 09/08/2022 09:36:52 Draheim, Breianna C ([id number]) [id number] Page 5 of 10 -------------------------------------------------------------------------------- Problem List Details Patient Name: Date of Service: [name] C. 09/08/2022 9:00 A M Medical Record Number: [id number] Patient Account Number: [id number] Date of Birth/Sex: Treating RN: 1961/05/11 (61 y.o. [name], [name] Primary Care Provider: [name] Other Clinician: Referring Provider: Treating Provider/Extender: [name] in Treatment: 26 Active Problems ICD-10 Encounter Code Description Active Date MDM Diagnosis [phone] Non-pressure chronic ulcer of other part of left foot with fat layer exposed 03/05/2022 No Yes E11.621 Type 2 diabetes mellitus with foot ulcer 03/05/2022 No Yes E66.01 Morbid (severe) obesity due to excess calories 03/05/2022 No Yes Inactive Problems Resolved Problems Electronic Signature(s) Signed: 09/08/2022 11:32:14 AM By: Geralyn Corwin DO Entered By: Geralyn Corwin on 09/08/2022 09:16:13 -------------------------------------------------------------------------------- Progress Note Details Patient Name: Date of Service: [name] C. 09/08/2022 9:00 A M Medical Record Number: [id number] Patient Account Number: [id number] Date of Birth/Sex: Treating RN: 1961/09/13 (61 y.o. F) Primary Care Provider: [name] Other Clinician: Referring Provider: Treating Provider/Extender: [name] in Treatment: 26 Subjective Chief Complaint Information obtained from Patient 03/05/2022; left foot wound History of Present Illness (HPI) Admission 03/05/2022 Ms. Mica Caporaso is a 61 year old female with a past medical history of uncontrolled insulin-dependent type 2 diabetes, tobacco user and chronic  diastolic heart failure that presents to the clinic for a 72-month history of wound to her left heel. She states she had a left ankle fusion in September 2022. She states that she always had a wound after the surgery and it never healed. She has home health and they have been doing compression wraps along with silver alginate to the wound bed. She currently denies signs of infection. 6/8; this is a 61 year old woman with type 2 diabetes. She developed a wound on her left Achilles heel just above the tip of the heel in the setting of recurrent ankle surgeries in late 2022. I have seen  some of these results from either the cast or the surgical boots that are put on after these operations. She thinks this may be the case. And a sense of pressure ulcer. In any case that she has been using Santyl Hydrofera Blue under compression. She is not wearing any footwear at home as she cannot find anything to accommodate the wrap Thrun, Denessa C ([id number]) [phone].pdf Page 6 of 10 6/22; patient presents for follow-up. She has home health that comes out once a week to help with dressing changes. She has no issues or complaints today. We have been using Hydrofera Blue under compression therapy. 7/6; patient presents for follow-up. She states that home health did not come out for the past 2 weeks. It is unclear why. We have been using Hydrofera Blue and Santyl under compression therapy. 7/13; patient presents for follow-up. She did not take the oral antibiotics prescribed at last clinic visit. We have been using Hydrofera Blue with gentamicin/mupirocin ointment under compression therapy. She has no issues or complaints today. She denies signs of infection. 7/20; patient presents for follow-up. We have been using Hydrofera Blue with antibiotic ointment under 3 layer compression. She has home health that change the dressing once. They put collagen on the wound bed. She also reports falling  yesterday and hitting her right foot. She has no pain to the area today. 7/27; patient presents for follow-up. We have been using Hydrofera Blue under 3 layer compression. She has home health that changes the dressing. She has no issues or complaints today. 8/3; patient presents for follow-up. We continues to use Hydrofera Blue under 3 layer compression. She has no issues or complaints today. She has been approved for Epicord at 100%. 8/11; patient presents for follow-up. We have been using Hydrofera Blue under 3 layer compression. She has been approved for Epicord and we have this today. She is in agreement with having this applied. 8/18; patient presents for follow-up. Epicord #1 was placed in standard fashion last clinic visit. She has no issues or complaints today. 9/18; Unfortunately patient has missed her last clinic appointments due to falling and breaking her right ankle. We have been following her for her left ankle wound. She has also developed a large blister to the left heel over the past week that has ruptured and dried. She currently resides In [facility]. Wound9/25; patient presents for follow-up. We have been using Hydrofera Blue and Santyl to the original left ankle wound and Xeroform to the dried blistered. We have been wrapping her with compression therapy. She resides in a facility and they changed the wrap once this past week. She has no issues or complaints today. She denies signs of infection. 10/3; patient presents for follow-up. We have been using Xeroform to the heel wound and Epicort to the left ankle wound All under compression therapy. She has no issues or complaints today. 10/10; patient presents for follow-up. We have been using Epicort to the left ankle wound and Santyl and Hydrofera Blue to the heel wound. All under compression therapy. she has no issues or complaints today. 10/16; patient presents for follow-up. Epicort was placed in standard fashion to the  superior wound and onto the heel and Santyl and Hydrofera Blue. She states that the wrap got wet during her shower and her facility was able to rewrap with Kerlix/Coban. 10/23; patient presents for follow-up. Epicord was placed to the wound beds last clinic visit. We continue Kerlix/Coban. She has no issues or complaints today. 10/31;  Patient presents for follow-up. Epicord was placed to the wound beds last clinic visit. The original wound is almost healed. We have done this under Kerlix/Coban. She denies signs of infection. 11/10; patient presents for follow-up. Patient's last epi cord was placed in standard fashion at last clinic visit. The original wound has healed. She still has a heel wound. Grafix was approved however too costly for the patient. We will rerun epi cord to see if she can have more applications. She had done very well with this. We have been using Hydrofera Blue to the heel under Kerlix/Coban. 11/21; patient presents for follow-up. We have been using Hydrofera Blue and Santyl to the heel under Kerlix/Coban. She switched to a new healthcare insurance and again The skin substitute is too costly for the patient. She denies signs of infection. 11/28; patient presents for follow-up. Her facility did not change the wrap last clinic visit due to lack of staffing. The wrap was taken off and Hydrofera Blue dressing has been in place for the past week. She has no issues or complaints today. 12/5; patient presents for follow-up. She states she has had more drainage over the past week. They did not change the wrap at her facility. She states the nurse will be back this week. She is also been doing a lot of physical therapy. She has been using the Prevalon boot while in bed. Patient History Information obtained from Patient, Chart. Family History Cancer - Father,Mother, Diabetes - Mother,Maternal Grandparents,Paternal Grandparents, Heart Disease - Siblings,Maternal Grandparents, Hypertension  - Siblings, Tuberculosis - Maternal Grandparents,Paternal Grandparents. Social History Current every day smoker, Marital Status - Single, Alcohol Use - Never, Drug Use - No History, Caffeine Use - Daily. Medical History Hematologic/Lymphatic Patient has history of Anemia Respiratory Patient has history of Chronic Obstructive Pulmonary Disease (COPD) Cardiovascular Patient has history of Congestive Heart Failure - Weighs self daily, Hypertension, Myocardial Infarction, Peripheral Venous Disease Endocrine Patient has history of Type II Diabetes Neurologic Patient has history of Neuropathy Medical A Surgical History Notes nd Gastrointestinal GERD, Peptic Ulcer Disease Musculoskeletal Broke left leg 3 years ago, 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, foot fused Psychiatric Depression Jaycox, Sabriyah C ([id number]) [id number] Page 7 of 10 Objective Constitutional respirations regular, non-labored and within target range for patient.. Vitals Time Taken: 8:58 AM, Height: 66 in, Weight: 339 lbs, BMI: 54.7, Temperature: 97.8 F, Pulse: 101 bpm, Respiratory Rate: 17 breaths/min, Blood Pressure: 172/80 mmHg. Cardiovascular 2+ dorsalis pedis/posterior tibialis pulses. Psychiatric pleasant and cooperative. General Notes: Left foot: T the heel wound there is dark and granulation tissue with slough. No surrounding signs of infection. 2+ pitting edema to the knee. o Integumentary (Hair, Skin) Wound #2 status is Open. Original cause of wound was Blister. The date acquired was: 06/15/2022. The wound has been in treatment 10 weeks. The wound is located on the Left,Distal Calcaneus. The wound measures 1.7cm length x 2.4cm width x 0.1cm depth; 3.204cm^2 area and 0.32cm^3 volume. There is Fat Layer (Subcutaneous Tissue) exposed. There is no tunneling or undermining noted. There is a medium amount of serosanguineous drainage noted. The wound margin is distinct with the outline attached to  the wound base. There is large (67-100%) red, pink granulation within the wound bed. There is a small (1-33%) amount of necrotic tissue within the wound bed including Adherent Slough. The periwound skin appearance had no abnormalities noted for texture. The periwound skin appearance had no abnormalities noted for color. The periwound skin appearance did not exhibit: Dry/Scaly, Maceration. Periwound temperature was  noted as No Abnormality. Assessment Active Problems ICD-10 Non-pressure chronic ulcer of other part of left foot with fat layer exposed Type 2 diabetes mellitus with foot ulcer Morbid (severe) obesity due to excess calories Patient's wound is stable. I debrided nonviable tissue. I recommended more aggressive offloading to the heel wound. I recommend she use the Prevalon boot while she is in the wheelchair. She knows to not transfer with this as this would be a risk for fall. Also recommended she not stand for long periods of time when doing physical therapy. She may need to do other exercises that does not incorporate the left heel. She expressed understanding. I recommended continuing with Hydrofera Blue and adding antibiotic ointment. Due to continued swelling I recommended going up to 4 layer compression. Follow-up in 1 week. 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 There was a Selective/Open Wound Non-Viable Tissue Debridement with a total area of 4.08 sq cm performed by Geralyn Corwin, DO. With the following instrument(s): Curette to remove Viable and Non-Viable tissue/material. Material removed includes Slough after achieving pain control using Lidocaine 5% topical ointment. A time out was conducted at 09:00, prior to the start of the procedure. A Minimum amount of bleeding was controlled with Pressure. The procedure was  tolerated well with a pain level of 0 throughout and a pain level of 0 following the procedure. Post Debridement Measurements: 1.7cm length x 2.4cm width x 0.1cm depth; 0.32cm^3 volume. Character of Wound/Ulcer Post Debridement is improved. Severity of Tissue Post Debridement is: Fat layer exposed. 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 There was a Four Layer Compression Therapy Procedure by Shawn Stall, RN. Post procedure Diagnosis Wound #2: Same as Pre-Procedure Plan Follow-up Appointments: Return Appointment in 1 week. - Dr. Mikey Bussing Return Appointment in 2 weeks. - Dr. Mikey Bussing Other: - facility to continue to change weekly and wound center to change weekly. Physical Therapy to be mindful no pressure to left heel wound. Patient to use more prevalon boot in bed and wheelchair. Do not transfer when in wheelchair. Anesthetic: Lenart, Danica C ([id number]) [id number] Page 8 of 10 (In clinic) Topical Lidocaine 5% applied to wound bed Cellular or Tissue Based Products: Cellular or Tissue Based Product Type: - Run IVR for EpiFix and Epicord=100% covered 05/07/22: Epicord ordered Cellular or Tissue Based Product applied to wound bed, secured with steri-strips, cover with Adaptic or Mepitel. (DO NOT REMOVE). - Epicord #1 05/15/25 Epicord #2 05/22/22 Epicord #3 06/29/2022 Epicord #4 07/07/2022 Epifix #5 07/14/22 Epicord #6 07/20/22 Epicord # 7 07/27/22 ***Run for Grafix***PENDING Epicord #8 08/04/2022 Bathing/ Shower/ Hygiene: May shower with protection but do not get wound dressing(s) wet. - May use cast protector bag from [facility], [facility] or [employer] Edema Control - Lymphedema / SCD / Other: Elevate legs to the level of the heart or above for 30 minutes daily and/or when sitting, a frequency of: Avoid standing for long periods of  time. Off-Loading: Prevalon Boot - use while in bed and chair. Additional Orders / Instructions: Follow Nutritious Diet - Monitor/Control Blood Sugar WOUND #2: - Calcaneus Wound Laterality: Left, Distal Cleanser: Soap and Water 2 x Per Week/30 Days Discharge Instructions: May shower and wash wound with dial antibacterial soap and water prior to dressing change. Cleanser:  Wound Cleanser 2 x Per Week/30 Days Discharge Instructions: Cleanse the wound with wound cleanser prior to applying a clean dressing using gauze sponges, not tissue or cotton balls. Peri-Wound Care: Sween Lotion (Moisturizing lotion) 2 x Per Week/30 Days Discharge Instructions: Apply moisturizing lotion as directed Topical: Gentamicin 2 x Per Week/30 Days Discharge Instructions: apply only in clinic directly to wound bed. Topical: Mupirocin Ointment 2 x Per Week/30 Days Discharge Instructions: apply only in clinic directly to wound bed. Prim Dressing: Hydrofera Blue Ready Foam, 2.5 x2.5 in 2 x Per Week/30 Days ary Discharge Instructions: apply over the santyl. Secondary Dressing: ABD Pad, 8x10 2 x Per Week/30 Days Discharge Instructions: Apply over primary dressing as directed. Secondary Dressing: Woven Gauze Sponge, Non-Sterile 4x4 in (Home Health) 2 x Per Week/30 Days Discharge Instructions: Apply over primary dressing as directed. Com pression Wrap: FourPress (4 layer compression wrap) 2 x Per Week/30 Days Discharge Instructions: Apply four layer compression as directed. May also use Miliken CoFlex 2 layer compression system as alternative. 1. In office sharp debridement 2. Hydrofera Blue with antibiotic ointment under 4-layer compression 3. Aggressive offloadingooPrevalon boot 4. Follow-up in 1 week Electronic Signature(s) Signed: 09/08/2022 11:32:14 AM By: Geralyn Corwin DO Entered By: Geralyn Corwin on 09/08/2022 09:38:49 -------------------------------------------------------------------------------- HxROS  Details Patient Name: Date of Service: [name], [name]. 09/08/2022 9:00 A M Medical Record Number: [id number] Patient Account Number: [id number] Date of Birth/Sex: Treating RN: [birth year] (61 y.o. F) Primary Care Provider: [name] Other Clinician: Referring Provider: Treating Provider/Extender: [name] in Treatment: 26 Information Obtained From Patient Chart Hematologic/Lymphatic Medical History: Positive for: Anemia Respiratory Medical History: Positive for: Chronic Obstructive Pulmonary Disease (COPD) Cardiovascular Medical History: Positive for: Congestive Heart Failure - Weighs self daily; Hypertension; Myocardial Infarction; Peripheral Venous Disease Pe, Kayani C ([id number]) [id number] Page 9 of 10 Gastrointestinal Medical History: Past Medical History Notes: GERD, Peptic Ulcer Disease Endocrine Medical History: Positive for: Type II Diabetes Time with diabetes: 16 years Treated with: Insulin, Oral agents Blood sugar tested every day: Yes Tested : Twice daily Musculoskeletal Medical History: Past Medical History Notes: Broke left leg 3 years ago, 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, foot fused Neurologic Medical History: Positive for: Neuropathy Psychiatric Medical History: Past Medical History Notes: Depression Immunizations Pneumococcal Vaccine: Received Pneumococcal Vaccination: No Implantable Devices None Family and Social History Cancer: Yes - Father,Mother; Diabetes: Yes - Mother,Maternal Grandparents,Paternal Grandparents; Heart Disease: Yes - Siblings,Maternal Grandparents; Hypertension: Yes - Siblings; Tuberculosis: Yes - Maternal Grandparents,Paternal Grandparents; Current every day smoker; Marital Status - Single; Alcohol Use: Never; Drug Use: No History; Caffeine Use: Daily; Financial Concerns: No; Food, Clothing or Shelter Needs: No; Support System Lacking: No; Transportation Concerns:  No Electronic Signature(s) Signed: 09/08/2022 11:32:14 AM By: Geralyn Corwin DO Entered By: Geralyn Corwin on 09/08/2022 09:36:17 -------------------------------------------------------------------------------- SuperBill Details Patient Name: Date of Service: [name] C. 09/08/2022 Medical Record Number: [id number] Patient Account Number: [id number] Date of Birth/Sex: Treating RN: Mar 14, 1961 (61 y.o. [name], [name] Primary Care Provider: [name] Other Clinician: Referring Provider: Treating Provider/Extender: [name] in Treatment: 26 Diagnosis Coding ICD-10 Codes Code Description [phone] Non-pressure chronic ulcer of other part of left foot with fat layer exposed E11.621 Type 2 diabetes mellitus with foot ulcer E66.01 Morbid (severe) obesity due to excess calories Prisk, Chieko C ([id number]) [id number] Page 10 of 10 Facility Procedures : CPT4 Code: [id number] Description: 97597 - DEBRIDE WOUND 1ST 20 SQ CM OR < ICD-10 Diagnosis Description L97.522 Non-pressure  chronic ulcer of other part of left foot with fat layer exposed Modifier: Quantity: 1 Physician Procedures : CPT4 Code Description Modifier [id number] - WC PHYS DEBR WO ANESTH 20 SQ CM ICD-10 Diagnosis Description L97.522 Non-pressure chronic ulcer of other part of left foot with fat layer exposed Quantity: 1 Electronic Signature(s) Signed: 09/08/2022 11:32:14 AM By: Geralyn Corwin DO Entered By: Geralyn Corwin on 09/08/2022 09:38:56

## 2022-09-15 ENCOUNTER — Encounter (HOSPITAL_BASED_OUTPATIENT_CLINIC_OR_DEPARTMENT_OTHER): Payer: 59 | Admitting: Internal Medicine

## 2022-09-15 DIAGNOSIS — E11621 Type 2 diabetes mellitus with foot ulcer: Secondary | ICD-10-CM

## 2022-09-15 DIAGNOSIS — L97522 Non-pressure chronic ulcer of other part of left foot with fat layer exposed: Secondary | ICD-10-CM | POA: Diagnosis not present

## 2022-09-15 NOTE — Progress Notes (Signed)
Zappulla, Margery C ([id number]) [id number] Page 1 of 9 Visit Report for 09/15/2022 Chief Complaint Document Details Patient Name: Date of Service: [name] 09/15/2022 12:45 PM Medical Record Number: [id number] Patient Account Number: [id number] Date of Birth/Sex: Treating RN: [birth year] (61 y.o. F) Primary Care Provider: [name] Other Clinician: Referring Provider: Treating Provider/Extender: [name] in Treatment: 27 Information Obtained from: Patient Chief Complaint 03/05/2022; left foot wound Electronic Signature(s) Signed: 09/15/2022 1:24:35 PM By: Geralyn Corwin DO Entered By: Geralyn Corwin on 09/15/2022 13:20:35 -------------------------------------------------------------------------------- HPI Details Patient Name: Date of Service: [name] Bushy C. 09/15/2022 12:45 PM Medical Record Number: [id number] Patient Account Number: [id number] Date of Birth/Sex: Treating RN: 1961/08/16 (61 y.o. F) Primary Care Provider: [name] Other Clinician: Referring Provider: Treating Provider/Extender: [name] in Treatment: 27 History of Present Illness HPI Description: Admission 03/05/2022 [name] is a 61 year old female with a past medical history of uncontrolled insulin-dependent type 2 diabetes, tobacco user and chronic diastolic heart failure that presents to the clinic for a 27-month history of wound to her left heel. She states she had a left ankle fusion in September 2022. She states that she always had a wound after the surgery and it never healed. She has home health and they have been doing compression wraps along with silver alginate to the wound bed. She currently denies signs of infection. 6/8; this is a 60 year old woman with type 2 diabetes. She developed a wound on her left Achilles heel just above the tip of the heel in the setting of  recurrent ankle surgeries in late 2022. I have seen some of these results from either the cast or the surgical boots that are put on after these operations. She thinks this may be the case. And a sense of pressure ulcer. In any case that she has been using Santyl Hydrofera Blue under compression. She is not wearing any footwear at home as she cannot find anything to accommodate the wrap 6/22; patient presents for follow-up. She has home health that comes out once a week to help with dressing changes. She has no issues or complaints today. We have been using Hydrofera Blue under compression therapy. 7/6; patient presents for follow-up. She states that home health did not come out for the past 2 weeks. It is unclear why. We have been using Hydrofera Blue and Santyl under compression therapy. 7/13; patient presents for follow-up. She did not take the oral antibiotics prescribed at last clinic visit. We have been using Hydrofera Blue with gentamicin/mupirocin ointment under compression therapy. She has no issues or complaints today. She denies signs of infection. 7/20; patient presents for follow-up. We have been using Hydrofera Blue with antibiotic ointment under 3 layer compression. She has home health that change the dressing once. They put collagen on the wound bed. She also reports falling yesterday and hitting her right foot. She has no pain to the area today. 7/27; patient presents for follow-up. We have been using Hydrofera Blue under 3 layer compression. She has home health that changes the dressing. She has no issues or complaints today. 8/3; patient presents for follow-up. We continues to use Hydrofera Blue under 3 layer compression. She has no issues or complaints today. She has been approved for Epicord at 100%. 8/11; patient presents for follow-up. We have been using Hydrofera Blue under 3 layer compression. She has been approved for Epicord and we have this today. She is in agreement with  having this applied. Kumagai, Agnieszka C ([id number]) [id number] Page 2 of 9 8/18; patient presents for follow-up. Epicord #1 was placed in standard fashion last clinic visit. She has no issues or complaints today. 9/18; Unfortunately patient has missed her last clinic appointments due to falling and breaking her right ankle. We have been following her for her left ankle wound. She has also developed a large blister to the left heel over the past week that has ruptured and dried. She currently resides In [facility]. Wound9/25; patient presents for follow-up. We have been using Hydrofera Blue and Santyl to the original left ankle wound and Xeroform to the dried blistered. We have been wrapping her with compression therapy. She resides in a facility and they changed the wrap once this past week. She has no issues or complaints today. She denies signs of infection. 10/3; patient presents for follow-up. We have been using Xeroform to the heel wound and Epicort to the left ankle wound All under compression therapy. She has no issues or complaints today. 10/10; patient presents for follow-up. We have been using Epicort to the left ankle wound and Santyl and Hydrofera Blue to the heel wound. All under compression therapy. she has no issues or complaints today. 10/16; patient presents for follow-up. Epicort was placed in standard fashion to the superior wound and onto the heel and Santyl and Hydrofera Blue. She states that the wrap got wet during her shower and her facility was able to rewrap with Kerlix/Coban. 10/23; patient presents for follow-up. Epicord was placed to the wound beds last clinic visit. We continue Kerlix/Coban. She has no issues or complaints today. 10/31; Patient presents for follow-up. Epicord was placed to the wound beds last clinic visit. The original wound is almost healed. We have done this under Kerlix/Coban. She denies signs of infection. 11/10; patient  presents for follow-up. Patient's last epi cord was placed in standard fashion at last clinic visit. The original wound has healed. She still has a heel wound. Grafix was approved however too costly for the patient. We will rerun epi cord to see if she can have more applications. She had done very well with this. We have been using Hydrofera Blue to the heel under Kerlix/Coban. 11/21; patient presents for follow-up. We have been using Hydrofera Blue and Santyl to the heel under Kerlix/Coban. She switched to a new healthcare insurance and again The skin substitute is too costly for the patient. She denies signs of infection. 11/28; patient presents for follow-up. Her facility did not change the wrap last clinic visit due to lack of staffing. The wrap was taken off and Hydrofera Blue dressing has been in place for the past week. She has no issues or complaints today. 12/5; patient presents for follow-up. She states she has had more drainage over the past week. They did not change the wrap at her facility. She states the nurse will be back this week. She is also been doing a lot of physical therapy. She has been using the Prevalon boot while in bed. 12/12; patient presents for follow-up. We have been using Hydrofera Blue with antibiotic ointment under 4-layer compression. She is tolerated the wrap well. She states she is using her Prevalon boot while in the wheelchair. She has no issues or complaints today. There is been improvement in wound healing. Electronic Signature(s) Signed: 09/15/2022 1:24:35 PM By: Geralyn Corwin DO Entered By: Geralyn Corwin on 09/15/2022 13:21:21 -------------------------------------------------------------------------------- Physical Exam Details Patient Name: Date of Service: [name], A  MY C. 09/15/2022 12:45 PM Medical Record Number: [id number] Patient Account Number: [id number] Date of Birth/Sex: Treating RN: 15-Mar-1961 (61 y.o. F) Primary Care Provider:  Junious Dresserampbell, Stephen Other Clinician: Referring Provider: Treating Provider/Extender: Andree MoroHoffman, Arlisha Patalano Campbell, Stephen Weeks in Treatment: 27 Constitutional respirations regular, non-labored and within target range for patient.. Cardiovascular 2+ dorsalis pedis/posterior tibialis pulses. Psychiatric pleasant and cooperative. Notes Left foot: T the heel there is granulation tissue with epithelization to the surrounding edges. No surrounding signs of infection. Good edema control. o Electronic Signature(s) Signed: 09/15/2022 1:24:35 PM By: Geralyn CorwinHoffman, Troy Hartzog DO Entered By: Geralyn CorwinHoffman, Abigaelle Verley on 09/15/2022 13:22:03 Suhre, Syndi C ([id number]) [id number] Page 3 of 9 -------------------------------------------------------------------------------- Physician Orders Details Patient Name: Date of Service: [name] BushyHUFFMA N, [name]. 09/15/2022 12:45 PM Medical Record Number: [id number] Patient Account Number: [id number] Date of Birth/Sex: Treating RN: 15-Mar-1961 (61 y.o. Debara [name]) [name], [name] Primary Care Provider: Junious Dresserampbell, Stephen Other Clinician: Referring Provider: Treating Provider/Extender: Andree MoroHoffman, Zyon Grout Campbell, Stephen Weeks in Treatment: 27 Verbal / Phone Orders: No Diagnosis Coding ICD-10 Coding Code Description [phone] [id number] Non-pressure chronic ulcer of other part of left foot with fat layer exposed E11.621 Type 2 diabetes mellitus with foot ulcer E66.01 Morbid (severe) obesity due to excess calories Follow-up Appointments ppointment in 1 week. - Dr. Mikey BussingHoffman Return A ppointment in 2 weeks. - Dr. Mikey BussingHoffman Return A Other: - facility to continue to change weekly and wound center to change weekly. Physical Therapy to be mindful no pressure to left heel wound. Patient to use more prevalon boot in bed and wheelchair. Do not transfer when in wheelchair. Anesthetic (In clinic) Topical Lidocaine 5% applied to wound bed Cellular or Tissue Based Products Cellular or Tissue  Based Product Type: - Run IVR for EpiFix and Epicord=100% covered 05/07/22: Epicord ordered daptic or Mepitel. (DO NOT REMOVE). - Cellular or Tissue Based Product applied to wound bed, secured with steri-strips, cover with A Epicord #1 05/15/25 Epicord #2 05/22/22 Epicord #3 06/29/2022 Epicord #4 07/07/2022 Epifix #5 07/14/22 Epicord #6 07/20/22 Epicord # 7 07/27/22 ***Run for Grafix***PENDING Epicord #8 08/04/2022 Bathing/ Shower/ Hygiene May shower with protection but do not get wound dressing(s) wet. - May use cast protector bag from [facility], [facility] or [employer] Edema Control - Lymphedema / SCD / Other Elevate legs to the level of the heart or above for 30 minutes daily and/or when sitting, a frequency of: Avoid standing for long periods of time. Off-Loading Prevalon Boot - use while in bed and chair. Additional Orders / Instructions Follow Nutritious Diet - Monitor/Control Blood Sugar Wound Treatment Wound #2 - Calcaneus Wound Laterality: Left, Distal Cleanser: Soap and Water 2 x Per Week/30 Days Discharge Instructions: May shower and wash wound with dial antibacterial soap and water prior to dressing change. Cleanser: Wound Cleanser 2 x Per Week/30 Days Discharge Instructions: Cleanse the wound with wound cleanser prior to applying a clean dressing using gauze sponges, not tissue or cotton balls. Peri-Wound Care: Sween Lotion (Moisturizing lotion) 2 x Per Week/30 Days Discharge Instructions: Apply moisturizing lotion as directed Topical: Gentamicin 2 x Per Week/30 Days Discharge Instructions: apply only in clinic directly to wound bed. Topical: Mupirocin Ointment 2 x Per Week/30 Days Discharge Instructions: apply only in clinic directly to wound bed. Leask, Adelei C ([id number]) [id number] Page 4 of 9 Prim Dressing: Hydrofera Blue Ready Foam, 2.5 x2.5 in 2 x Per Week/30 Days ary Discharge Instructions: apply over the santyl. Secondary Dressing: ABD Pad,  8x10 2 x Per Week/30 Days Discharge Instructions: Apply over primary dressing  as directed. Secondary Dressing: Woven Gauze Sponge, Non-Sterile 4x4 in (Home Health) 2 x Per Week/30 Days Discharge Instructions: Apply over primary dressing as directed. Compression Wrap: FourPress (4 layer compression wrap) 2 x Per Week/30 Days Discharge Instructions: Apply four layer compression as directed. May also use Miliken CoFlex 2 layer compression system as alternative. Electronic Signature(s) Signed: 09/15/2022 1:24:35 PM By: Geralyn Corwin DO Entered By: Geralyn Corwin on 09/15/2022 13:22:14 -------------------------------------------------------------------------------- Problem List Details Patient Name: Date of Service: [name] Bushy C. 09/15/2022 12:45 PM Medical Record Number: [id number] Patient Account Number: [id number] Date of Birth/Sex: Treating RN: 12-30-1960 (61 y.o. [name], [name] Primary Care Provider: [name] Other Clinician: Referring Provider: Treating Provider/Extender: [name] in Treatment: 27 Active Problems ICD-10 Encounter Code Description Active Date MDM Diagnosis [phone] Non-pressure chronic ulcer of other part of left foot with fat layer exposed 03/05/2022 No Yes E11.621 Type 2 diabetes mellitus with foot ulcer 03/05/2022 No Yes E66.01 Morbid (severe) obesity due to excess calories 03/05/2022 No Yes Inactive Problems Resolved Problems Electronic Signature(s) Signed: 09/15/2022 1:24:35 PM By: Geralyn Corwin DO Previous Signature: 09/15/2022 12:55:16 PM Version By: Geralyn Corwin DO Entered By: Geralyn Corwin on 09/15/2022 13:20:20 -------------------------------------------------------------------------------- Progress Note Details Patient Name: Date of Service: [name], [name]. 09/15/2022 12:45 PM Vanwingerden, Dasani C ([id number]) [id number] Page 5 of 9 Medical Record Number:  [id number] Patient Account Number: [id number] Date of Birth/Sex: Treating RN: 08/26/1961 (61 y.o. F) Primary Care Provider: [name] Other Clinician: Referring Provider: Treating Provider/Extender: [name] in Treatment: 27 Subjective Chief Complaint Information obtained from Patient 03/05/2022; left foot wound History of Present Illness (HPI) Admission 03/05/2022 [name] is a 61 year old female with a past medical history of uncontrolled insulin-dependent type 2 diabetes, tobacco user and chronic diastolic heart failure that presents to the clinic for a 22-month history of wound to her left heel. She states she had a left ankle fusion in September 2022. She states that she always had a wound after the surgery and it never healed. She has home health and they have been doing compression wraps along with silver alginate to the wound bed. She currently denies signs of infection. 6/8; this is a 61 year old woman with type 2 diabetes. She developed a wound on her left Achilles heel just above the tip of the heel in the setting of recurrent ankle surgeries in late 2022. I have seen some of these results from either the cast or the surgical boots that are put on after these operations. She thinks this may be the case. And a sense of pressure ulcer. In any case that she has been using Santyl Hydrofera Blue under compression. She is not wearing any footwear at home as she cannot find anything to accommodate the wrap 6/22; patient presents for follow-up. She has home health that comes out once a week to help with dressing changes. She has no issues or complaints today. We have been using Hydrofera Blue under compression therapy. 7/6; patient presents for follow-up. She states that home health did not come out for the past 2 weeks. It is unclear why. We have been using Hydrofera Blue and Santyl under compression therapy. 7/13; patient presents for follow-up.  She did not take the oral antibiotics prescribed at last clinic visit. We have been using Hydrofera Blue with gentamicin/mupirocin ointment under compression therapy. She has no issues or complaints today. She denies signs of infection. 7/20; patient presents for follow-up. We have  been using Hydrofera Blue with antibiotic ointment under 3 layer compression. She has home health that change the dressing once. They put collagen on the wound bed. She also reports falling yesterday and hitting her right foot. She has no pain to the area today. 7/27; patient presents for follow-up. We have been using Hydrofera Blue under 3 layer compression. She has home health that changes the dressing. She has no issues or complaints today. 8/3; patient presents for follow-up. We continues to use Hydrofera Blue under 3 layer compression. She has no issues or complaints today. She has been approved for Epicord at 100%. 8/11; patient presents for follow-up. We have been using Hydrofera Blue under 3 layer compression. She has been approved for Epicord and we have this today. She is in agreement with having this applied. 8/18; patient presents for follow-up. Epicord #1 was placed in standard fashion last clinic visit. She has no issues or complaints today. 9/18; Unfortunately patient has missed her last clinic appointments due to falling and breaking her right ankle. We have been following her for her left ankle wound. She has also developed a large blister to the left heel over the past week that has ruptured and dried. She currently resides In [facility]. Wound9/25; patient presents for follow-up. We have been using Hydrofera Blue and Santyl to the original left ankle wound and Xeroform to the dried blistered. We have been wrapping her with compression therapy. She resides in a facility and they changed the wrap once this past week. She has no issues or complaints today. She denies signs of infection. 10/3; patient  presents for follow-up. We have been using Xeroform to the heel wound and Epicort to the left ankle wound All under compression therapy. She has no issues or complaints today. 10/10; patient presents for follow-up. We have been using Epicort to the left ankle wound and Santyl and Hydrofera Blue to the heel wound. All under compression therapy. she has no issues or complaints today. 10/16; patient presents for follow-up. Epicort was placed in standard fashion to the superior wound and onto the heel and Santyl and Hydrofera Blue. She states that the wrap got wet during her shower and her facility was able to rewrap with Kerlix/Coban. 10/23; patient presents for follow-up. Epicord was placed to the wound beds last clinic visit. We continue Kerlix/Coban. She has no issues or complaints today. 10/31; Patient presents for follow-up. Epicord was placed to the wound beds last clinic visit. The original wound is almost healed. We have done this under Kerlix/Coban. She denies signs of infection. 11/10; patient presents for follow-up. Patient's last epi cord was placed in standard fashion at last clinic visit. The original wound has healed. She still has a heel wound. Grafix was approved however too costly for the patient. We will rerun epi cord to see if she can have more applications. She had done very well with this. We have been using Hydrofera Blue to the heel under Kerlix/Coban. 11/21; patient presents for follow-up. We have been using Hydrofera Blue and Santyl to the heel under Kerlix/Coban. She switched to a new healthcare insurance and again The skin substitute is too costly for the patient. She denies signs of infection. 11/28; patient presents for follow-up. Her facility did not change the wrap last clinic visit due to lack of staffing. The wrap was taken off and Hydrofera Blue dressing has been in place for the past week. She has no issues or complaints today. 12/5; patient presents for follow-up.  She states she has had more drainage over the past week. They did not change the wrap at her facility. She states the nurse will be back this week. She is also been doing a lot of physical therapy. She has been using the Prevalon boot while in bed. 12/12; patient presents for follow-up. We have been using Hydrofera Blue with antibiotic ointment under 4-layer compression. She is tolerated the wrap well. She states she is using her Prevalon boot while in the wheelchair. She has no issues or complaints today. There is been improvement in wound healing. Patient History Information obtained from Patient, Chart. Family History Campise, Marcos C ([id number]) [phone].pdf Page 6 of 9 Cancer - Father,Mother, Diabetes - Mother,Maternal Grandparents,Paternal Grandparents, Heart Disease - Siblings,Maternal Grandparents, Hypertension - Siblings, Tuberculosis - Maternal Grandparents,Paternal Grandparents. Social History Current every day smoker, Marital Status - Single, Alcohol Use - Never, Drug Use - No History, Caffeine Use - Daily. 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 Vitals Time Taken: 12:58 PM, Height: 66 in, Weight: 339 lbs, BMI: 54.7, Temperature: 98 F, Pulse: 101 bpm, Respiratory Rate: 17 breaths/min, Blood Pressure: 131/79  mmHg. Cardiovascular 2+ dorsalis pedis/posterior tibialis pulses. Psychiatric pleasant and cooperative. General Notes: Left foot: T the heel there is granulation tissue with epithelization to the surrounding edges. No surrounding signs of infection. Good edema o control. Integumentary (Hair, Skin) Wound #2 status is Open. Original cause of wound was Blister. The date acquired was: 06/15/2022. The wound has been in treatment 11 weeks. The wound is located on the Left,Distal Calcaneus. The wound measures 1.7cm length x 2.4cm width x 0.1cm depth; 3.204cm^2 area and 0.32cm^3 volume. There is Fat Layer (Subcutaneous Tissue) exposed. There is no tunneling or undermining noted. There is a medium amount of serosanguineous drainage noted. The wound margin is distinct with the outline attached to the wound base. There is large (67-100%) red, pink granulation within the wound bed. There is a small (1-33%) amount of necrotic tissue within the wound bed including Adherent Slough. The periwound skin appearance had no abnormalities noted for texture. The periwound skin appearance had no abnormalities noted for color. The periwound skin appearance did not exhibit: Dry/Scaly, Maceration. Periwound temperature was noted as No Abnormality. Assessment Active Problems ICD-10 Non-pressure chronic ulcer of other part of left foot with fat layer exposed Type 2 diabetes mellitus with foot ulcer Morbid (severe) obesity due to excess calories Patient's wound appears well-healing. I recommended continuing the course with Hydrofera Blue and antibiotic ointment under 4-layer compression. Continue aggressive offloading with the Prevalon boot. She knows not to transfer with this. Follow-up in 1 week. Procedures Wound #2 Merica, Alara C ([id number]) [id number] Page 7 of 9 Pre-procedure diagnosis of Wound #2 is a Diabetic Wound/Ulcer of the Lower Extremity located on the Left,Distal Calcaneus .  There was a Four Layer Compression Therapy Procedure by Thayer Dallas. Post procedure Diagnosis Wound #2: Same as Pre-Procedure Plan Follow-up Appointments: Return Appointment in 1 week. - Dr. Mikey Bussing Return Appointment in 2 weeks. - Dr. Mikey Bussing Other: - facility  to continue to change weekly and wound center to change weekly. Physical Therapy to be mindful no pressure to left heel wound. Patient to use more prevalon boot in bed and wheelchair. Do not transfer when in wheelchair. Anesthetic: (In clinic) Topical Lidocaine 5% applied to wound bed Cellular or Tissue Based Products: Cellular or Tissue Based Product Type: - Run IVR for EpiFix and Epicord=100% covered 05/07/22: Epicord ordered Cellular or Tissue Based Product applied to wound bed, secured with steri-strips, cover with Adaptic or Mepitel. (DO NOT REMOVE). - Epicord #1 05/15/25 Epicord #2 05/22/22 Epicord #3 06/29/2022 Epicord #4 07/07/2022 Epifix #5 07/14/22 Epicord #6 07/20/22 Epicord # 7 07/27/22 ***Run for Grafix***PENDING Epicord #8 08/04/2022 Bathing/ Shower/ Hygiene: May shower with protection but do not get wound dressing(s) wet. - May use cast protector bag from [facility], [facility] or [employer] Edema Control - Lymphedema / SCD / Other: Elevate legs to the level of the heart or above for 30 minutes daily and/or when sitting, a frequency of: Avoid standing for long periods of time. Off-Loading: Prevalon Boot - use while in bed and chair. Additional Orders / Instructions: Follow Nutritious Diet - Monitor/Control Blood Sugar WOUND #2: - Calcaneus Wound Laterality: Left, Distal Cleanser: Soap and Water 2 x Per Week/30 Days Discharge Instructions: May shower and wash wound with dial antibacterial soap and water prior to dressing change. Cleanser: Wound Cleanser 2 x Per Week/30 Days Discharge Instructions: Cleanse the wound with wound cleanser prior to applying a clean dressing using gauze sponges, not tissue or cotton balls. Peri-Wound  Care: Sween Lotion (Moisturizing lotion) 2 x Per Week/30 Days Discharge Instructions: Apply moisturizing lotion as directed Topical: Gentamicin 2 x Per Week/30 Days Discharge Instructions: apply only in clinic directly to wound bed. Topical: Mupirocin Ointment 2 x Per Week/30 Days Discharge Instructions: apply only in clinic directly to wound bed. Prim Dressing: Hydrofera Blue Ready Foam, 2.5 x2.5 in 2 x Per Week/30 Days ary Discharge Instructions: apply over the santyl. Secondary Dressing: ABD Pad, 8x10 2 x Per Week/30 Days Discharge Instructions: Apply over primary dressing as directed. Secondary Dressing: Woven Gauze Sponge, Non-Sterile 4x4 in (Home Health) 2 x Per Week/30 Days Discharge Instructions: Apply over primary dressing as directed. Com pression Wrap: FourPress (4 layer compression wrap) 2 x Per Week/30 Days Discharge Instructions: Apply four layer compression as directed. May also use Miliken CoFlex 2 layer compression system as alternative. 1. Hydrofera Blue with antibiotic ointment under 4-layer compressionooleft lower extremity 2. Continue aggressive offloadingooPrevalon boot when in bed and wheelchair 3. Follow-up in 1 week Electronic Signature(s) Signed: 09/15/2022 1:24:35 PM By: Geralyn Corwin DO Entered By: Geralyn Corwin on 09/15/2022 13:23:13 -------------------------------------------------------------------------------- HxROS Details Patient Name: Date of Service: [name] Bushy C. 09/15/2022 12:45 PM Medical Record Number: [id number] Patient Account Number: [id number] Date of Birth/Sex: Treating RN: 01-Jun-1961 (61 y.o. F) Primary Care Provider: [name] Other Clinician: Referring Provider: Treating Provider/Extender: [name] in Treatment: 27 Information Obtained From Patient Chart Swoveland, Olisa C ([id number]) [id number] Page 8 of 9 Hematologic/Lymphatic Medical History: Positive  for: Anemia Respiratory Medical History: Positive for: Chronic Obstructive Pulmonary Disease (COPD) Cardiovascular Medical History: Positive for: Congestive Heart Failure - Weighs self daily; Hypertension; Myocardial Infarction; Peripheral Venous Disease Gastrointestinal Medical History: Past Medical History Notes: GERD, Peptic Ulcer Disease Endocrine Medical History: Positive for: Type II Diabetes Time with diabetes: 16 years Treated with: Insulin, Oral agents Blood sugar tested every day: Yes Tested : Twice daily Musculoskeletal Medical History: Past Medical  History Notes: Broke left leg 3 years ago, 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, foot fused Neurologic Medical History: Positive for: Neuropathy Psychiatric Medical History: Past Medical History Notes: Depression Immunizations Pneumococcal Vaccine: Received Pneumococcal Vaccination: No Implantable Devices None Family and Social History Cancer: Yes - Father,Mother; Diabetes: Yes - Mother,Maternal Grandparents,Paternal Grandparents; Heart Disease: Yes - Siblings,Maternal Grandparents; Hypertension: Yes - Siblings; Tuberculosis: Yes - Maternal Grandparents,Paternal Grandparents; Current every day smoker; Marital Status - Single; Alcohol Use: Never; Drug Use: No History; Caffeine Use: Daily; Financial Concerns: No; Food, Clothing or Shelter Needs: No; Support System Lacking: No; Transportation Concerns: No Electronic Signature(s) Signed: 09/15/2022 1:24:35 PM By: Geralyn Corwin DO Entered By: Geralyn Corwin on 09/15/2022 13:21:27 -------------------------------------------------------------------------------- SuperBill Details Patient Name: Date of Service: [name] Bushy C. 09/15/2022 Medical Record Number: [id number] Patient Account Number: [id number] Date of Birth/Sex: Treating RN: 11-24-1960 (61 y.o. [name], Bobbi Bradstreet, Marylee C ([id number]) [id number] Page 9 of 9 Primary Care Provider:  [name] Other Clinician: Referring Provider: Treating Provider/Extender: [name] in Treatment: 27 Diagnosis Coding ICD-10 Codes Code Description 727 [id number] 4480 Non-pressure chronic ulcer of other part of left foot with fat layer exposed E11.621 Type 2 diabetes mellitus with foot ulcer E66.01 Morbid (severe) obesity due to excess calories Facility Procedures : CPT4 Code: 21308657 Description: (Facility Use Only) 29581LT - APPLY MULTLAY COMPRS LWR LT LEG Modifier: Quantity: 1 Physician Procedures : CPT4 Code Description Modifier 8469629 99213 - WC PHYS LEVEL 3 - EST PT ICD-10 Diagnosis Description L97.522 Non-pressure chronic ulcer of other part of left foot with fat layer exposed E11.621 Type 2 diabetes mellitus with foot ulcer E66.01 Morbid  (severe) obesity due to excess calories Quantity: 1 Electronic Signature(s) Signed: 09/15/2022 1:24:35 PM By: Geralyn Corwin DO Entered By: Geralyn Corwin on 09/15/2022 13:24:06

## 2022-09-17 NOTE — Progress Notes (Signed)
Lyne, Charolette [name] ([id number]) [id number] Page 1 of 8 Visit Report for 09/15/2022 Arrival Information Details Patient Name: Date of Service: [name] 09/15/2022 12:45 PM Medical Record Number: [id number] Patient Account Number: [id number] Date of Birth/Sex: Treating RN: 1960-12-08 (61 y.o. [name], [name] Primary Care Josemaria Brining: Jenean Lindau Other Clinician: Referring Sharis Keeran: Treating Marion Seese/Extender: Elise Benne in Treatment: 27 Visit Information History Since Last Visit Added or deleted any medications: No Patient Arrived: Wheel Chair Any new allergies or adverse reactions: No Arrival Time: 12:57 Had a fall or experienced change in No Accompanied By: self activities of daily living that may affect Transfer Assistance: Manual risk of falls: Patient Identification Verified: Yes Signs or symptoms of abuse/neglect since last visito No Secondary Verification Process Completed: Yes Hospitalized since last visit: No Patient Requires Transmission-Based Precautions: No Implantable device outside of the clinic excluding No Patient Has Alerts: No cellular tissue based products placed in the center since last visit: Has Dressing in Place as Prescribed: Yes Has Compression in Place as Prescribed: Yes Pain Present Now: No Electronic Signature(s) Signed: 09/17/2022 5:16:09 PM By: Rhae Hammock RN Entered By: Rhae Hammock on 09/15/2022 12:58:09 -------------------------------------------------------------------------------- Compression Therapy Details Patient Name: Date of Service: [name] Lax [name]. 09/15/2022 12:45 PM Medical Record Number: [id number] Patient Account Number: [id number] Date of Birth/Sex: Treating RN: 02/26/1961 (61 y.o. [name] Primary Care Allyssia Skluzacek: Jenean Lindau Other Clinician: Referring Darryl Willner: Treating Jenniah Bhavsar/Extender: Elise Benne in  Treatment: 27 Compression Therapy Performed for Wound Assessment: Wound #2 Left,Distal Calcaneus Performed By: Clinician Erenest Blank, Compression Type: Four Layer Post Procedure Diagnosis Same as Pre-procedure Electronic Signature(s) Signed: 09/15/2022 4:43:40 PM By: Deon Pilling RN, BSN Entered By: Deon Pilling on 09/15/2022 13:18:53 [name], [name] ([id number]) [id number] Page 2 of 8 -------------------------------------------------------------------------------- Encounter Discharge Information Details Patient Name: Date of Service: [name] 09/15/2022 12:45 PM Medical Record Number: [id number] Patient Account Number: [id number] Date of Birth/Sex: Treating RN: [birth year] (61 y.o. [name], [name] Primary Care Dontray Haberland: Jenean Lindau Other Clinician: Referring Jamario Colina: Treating Merian Wroe/Extender: Elise Benne in Treatment: 27 Encounter Discharge Information Items Discharge Condition: Stable Ambulatory Status: Wheelchair Discharge Destination: Skilled Nursing Facility Telephoned: No Orders Sent: Yes Transportation: Private Auto Accompanied By: self Schedule Follow-up Appointment: Yes Clinical Summary of Care: Electronic Signature(s) Signed: 09/15/2022 4:43:40 PM By: Deon Pilling RN, BSN Entered By: Deon Pilling on 09/15/2022 13:20:03 -------------------------------------------------------------------------------- Lower Extremity Assessment Details Patient Name: Date of Service: [name] Lax [name]. 09/15/2022 12:45 PM Medical Record Number: [id number] Patient Account Number: [id number] Date of Birth/Sex: Treating RN: [birth year] (61 y.o. [name], [name] Primary Care Edouard Gikas: Jenean Lindau Other Clinician: Referring Keishawna Carranza: Treating Yonael Tulloch/Extender: Elise Benne in Treatment: 27 Edema Assessment Assessed: Shirlyn Goltz: Yes] Patrice Paradise: No] Edema: [Left: Ye] [Right:  s] Calf Left: Right: Point of Measurement: 28 cm From Medial Instep 48 cm Ankle Left: Right: Point of Measurement: 3 cm From Medial Instep 28 cm Vascular Assessment Pulses: Dorsalis Pedis Palpable: [Left:Yes] Posterior Tibial Palpable: [Left:Yes] Electronic Signature(s) Signed: 09/17/2022 5:16:09 PM By: Rhae Hammock RN Entered By: Rhae Hammock on 09/15/2022 13:03:34 [name], [name] ([id number]) [id number] Page 3 of 8 -------------------------------------------------------------------------------- Multi Wound Chart Details Patient Name: Date of Service: [name] 09/15/2022 12:45 PM Medical Record Number: [id number] Patient Account Number: [id number] Date of Birth/Sex: Treating RN: 08-14-1961 (61 y.o. F) Primary Care Calla Wedekind: Jenean Lindau Other Clinician: Referring Jovonte Commins: Treating Wilkins Elpers/Extender: Kai Levins,  Alexis Goodell in Treatment: 27 Vital Signs Height(in): 66 Pulse(bpm): 101 Weight(lbs): 339 Blood Pressure(mmHg): 131/79 Body Mass Index(BMI): 54.7 Temperature(F): 98 Respiratory Rate(breaths/min): 17 [2:Photos:] [N/A:N/A] Left, Distal Calcaneus N/A N/A Wound Location: Blister N/A N/A Wounding Event: Diabetic Wound/Ulcer of the Lower N/A N/A Primary Etiology: Extremity Anemia, Chronic Obstructive N/A N/A Comorbid History: Pulmonary Disease (COPD), Congestive Heart Failure, Hypertension, Myocardial Infarction, Peripheral Venous Disease, Type II Diabetes, Neuropathy 06/15/2022 N/A N/A Date Acquired: 11 N/A N/A Weeks of Treatment: Open N/A N/A Wound Status: No N/A N/A Wound Recurrence: 1.7x2.4x0.1 N/A N/A Measurements L x W x D (cm) 3.204 N/A N/A A (cm) : rea 0.32 N/A N/A Volume (cm) : 77.30% N/A N/A % Reduction in A rea: 77.40% N/A N/A % Reduction in Volume: Grade 2 N/A N/A Classification: Medium N/A N/A Exudate A mount: Serosanguineous N/A N/A Exudate Type: red, brown  N/A N/A Exudate Color: Distinct, outline attached N/A N/A Wound Margin: Large (67-100%) N/A N/A Granulation A mount: Red, Pink N/A N/A Granulation Quality: Small (1-33%) N/A N/A Necrotic A mount: Fat Layer (Subcutaneous Tissue): Yes N/A N/A Exposed Structures: Fascia: No Tendon: No Muscle: No Joint: No Bone: No Medium (34-66%) N/A N/A Epithelialization: No Abnormalities Noted N/A N/A Periwound Skin Texture: Maceration: No N/A N/A Periwound Skin Moisture: Dry/Scaly: No No Abnormalities Noted N/A N/A Periwound Skin Color: No Abnormality N/A N/A Temperature: Compression Therapy N/A N/A Procedures Performed: Treatment Notes Wound #2 (Calcaneus) Wound Laterality: Left, Distal Cleanser Tremaine, Karlen [name] ([id number]) [id number] Page 4 of 8 Soap and Water Discharge Instruction: May shower and wash wound with dial antibacterial soap and water prior to dressing change. Wound Cleanser Discharge Instruction: Cleanse the wound with wound cleanser prior to applying a clean dressing using gauze sponges, not tissue or cotton balls. Peri-Wound Care Sween Lotion (Moisturizing lotion) Discharge Instruction: Apply moisturizing lotion as directed Topical Gentamicin Discharge Instruction: apply only in clinic directly to wound bed. Mupirocin Ointment Discharge Instruction: apply only in clinic directly to wound bed. Primary Dressing Hydrofera Blue Ready Foam, 2.5 x2.5 in Discharge Instruction: apply over the santyl. Secondary Dressing ABD Pad, 8x10 Discharge Instruction: Apply over primary dressing as directed. Woven Gauze Sponge, Non-Sterile 4x4 in Discharge Instruction: Apply over primary dressing as directed. Secured With Compression Wrap FourPress (4 layer compression wrap) Discharge Instruction: Apply four layer compression as directed. May also use Miliken CoFlex 2 layer compression system as alternative. Compression Stockings Add-Ons Electronic  Signature(s) Signed: 09/15/2022 1:24:35 PM By: Kalman Shan DO Entered By: Kalman Shan on 09/15/2022 13:20:27 -------------------------------------------------------------------------------- Multi-Disciplinary Care Plan Details Patient Name: Date of Service: [name] Lax [name]. 09/15/2022 12:45 PM Medical Record Number: [id number] Patient Account Number: [id number] Date of Birth/Sex: Treating RN: [birth year] (61 y.o. [name], [name] Primary Care Yeraldi Fidler: Jenean Lindau Other Clinician: Referring Cori Justus: Treating Teddie Curd/Extender: Elise Benne in Treatment: 27 Active Inactive Abuse / Safety / Falls / Self Care Management Nursing Diagnoses: History of Falls Goals: Patient will not experience any injury related to falls Date Initiated: 03/05/2022 Target Resolution Date: 11/06/2022 Goal Status: Active Interventions: Assess fall risk on admission and as needed Assess: immobility, friction, shearing, incontinence upon admission and as needed [name], [name] ([id number]) [id number] Page 5 of 8 Assess impairment of mobility on admission and as needed per policy Notes: 4/92/01: Fall prevention ongoing, recent fall. Wound/Skin Impairment Nursing Diagnoses: Impaired tissue integrity Goals: Patient/caregiver will verbalize understanding of skin care regimen Date Initiated: 03/05/2022 Target Resolution Date: 11/06/2022 Goal Status: Active Ulcer/skin breakdown will have  a volume reduction of 30% by week 4 Date Initiated: 03/05/2022 Date Inactivated: 04/30/2022 Target Resolution Date: 05/02/2022 Goal Status: Met Ulcer/skin breakdown will have a volume reduction of 50% by week 8 Date Initiated: 04/30/2022 Date Inactivated: 07/07/2022 Target Resolution Date: 07/04/2022 Unmet Reason: patient sent three Goal Status: Unmet weeks inpatient and rehab before returning to wound center. Interventions: Assess patient/caregiver ability to  obtain necessary supplies Assess patient/caregiver ability to perform ulcer/skin care regimen upon admission and as needed Assess ulceration(s) every visit Provide education on smoking Provide education on ulcer and skin care Treatment Activities: Topical wound management initiated : 03/05/2022 Notes: 04/30/22: Wound care regimen continues. Electronic Signature(s) Signed: 09/15/2022 4:43:40 PM By: Deon Pilling RN, BSN Entered By: Deon Pilling on 09/15/2022 12:48:43 -------------------------------------------------------------------------------- Pain Assessment Details Patient Name: Date of Service: [name] Lax [name]. 09/15/2022 12:45 PM Medical Record Number: [id number] Patient Account Number: [id number] Date of Birth/Sex: Treating RN: Jul 19, 1961 (61 y.o. [name], [name] Primary Care Halima Fogal: Jenean Lindau Other Clinician: Referring Sherril Heyward: Treating Akin Yi/Extender: Elise Benne in Treatment: 27 Active Problems Location of Pain Severity and Description of Pain Patient Has Paino Yes Site Locations Pain Location: Nazareno, Lety [name] ([id number]) [id number] Page 6 of 8 Pain Location: Generalized Pain, Pain in Ulcers With Dressing Change: Yes Duration of the Pain. Constant / Intermittento Constant Rate the pain. Current Pain Level: 4 Worst Pain Level: 10 Least Pain Level: 0 Tolerable Pain Level: 5 Character of Pain Describe the Pain: Aching Pain Management and Medication Current Pain Management: Medication: No Cold Application: No Rest: No Massage: No Activity: No T.E.N.S.: No Heat Application: No Leg drop or elevation: No Is the Current Pain Management Adequate: Adequate How does your wound impact your activities of daily livingo Sleep: No Bathing: No Appetite: No Relationship With Others: No Bladder Continence: No Emotions: No Bowel Continence: No Work: No Toileting: No Drive: No Dressing:  No Hobbies: No Electronic Signature(s) Signed: 09/17/2022 5:16:09 PM By: Rhae Hammock RN Entered By: Rhae Hammock on 09/15/2022 12:58:42 -------------------------------------------------------------------------------- Patient/Caregiver Education Details Patient Name: Date of Service: [name] 12/12/2023andnbsp12:45 PM Medical Record Number: [id number] Patient Account Number: [id number] Date of Birth/Gender: Treating RN: 1961/01/13 (61 y.o. [name] Primary Care Physician: Jenean Lindau Other Clinician: Referring Physician: Treating Physician/Extender: Elise Benne in Treatment: 27 Education Assessment Education Provided To: Patient Education Topics Provided Wound/Skin Impairment: Handouts: Skin Care Do's and Dont's Methods: Explain/Verbal Responses: Reinforcements needed Electronic Signature(s) Signed: 09/15/2022 4:43:40 PM By: Deon Pilling RN, BSN Coca, Ima [name] ([id number]) [id number] Page 7 of 8 Signed: 09/15/2022 4:43:40 PM By: Deon Pilling RN, BSN Entered By: Deon Pilling on 09/15/2022 12:48:54 -------------------------------------------------------------------------------- Wound Assessment Details Patient Name: Date of Service: [name] Lax [name]. 09/15/2022 12:45 PM Medical Record Number: [id number] Patient Account Number: [id number] Date of Birth/Sex: Treating RN: 08/07/1961 (61 y.o. [name], [name] Primary Care Latika Kronick: Jenean Lindau Other Clinician: Referring Yoseline Andersson: Treating Brytney Somes/Extender: Elise Benne in Treatment: 27 Wound Status Wound Number: 2 Primary Diabetic Wound/Ulcer of the Lower Extremity Etiology: Wound Location: Left, Distal Calcaneus Wound Open Wounding Event: Blister Status: Date Acquired: 06/15/2022 Comorbid Anemia, Chronic Obstructive Pulmonary Disease (COPD), Weeks Of Treatment: 11 History: Congestive Heart  Failure, Hypertension, Myocardial Infarction, Clustered Wound: No Peripheral Venous Disease, Type II Diabetes, Neuropathy Photos Wound Measurements Length: (cm) 1.7 Width: (cm) 2.4 Depth: (cm) 0.1 Area: (cm) 3.204 Volume: (cm) 0.32 % Reduction in Area: 77.3% % Reduction in Volume: 77.4% Epithelialization: Medium (  34-66%) Tunneling: No Undermining: No Wound Description Classification: Grade 2 Wound Margin: Distinct, outline attached Exudate Amount: Medium Exudate Type: Serosanguineous Exudate Color: red, brown Foul Odor After Cleansing: No Slough/Fibrino Yes Wound Bed Granulation Amount: Large (67-100%) Exposed Structure Granulation Quality: Red, Pink Fascia Exposed: No Necrotic Amount: Small (1-33%) Fat Layer (Subcutaneous Tissue) Exposed: Yes Necrotic Quality: Adherent Slough Tendon Exposed: No Muscle Exposed: No Joint Exposed: No Bone Exposed: No Periwound Skin Texture Texture Color No Abnormalities Noted: Yes No Abnormalities Noted: Yes Moisture Temperature / Pain No Abnormalities Noted: No Temperature: No Abnormality Dry / Scaly: No Maceration: No Crayton, Ambermarie [name] ([id number]) [id number] Page 8 of 8 Treatment Notes Wound #2 (Calcaneus) Wound Laterality: Left, Distal Cleanser Soap and Water Discharge Instruction: May shower and wash wound with dial antibacterial soap and water prior to dressing change. Wound Cleanser Discharge Instruction: Cleanse the wound with wound cleanser prior to applying a clean dressing using gauze sponges, not tissue or cotton balls. Peri-Wound Care Sween Lotion (Moisturizing lotion) Discharge Instruction: Apply moisturizing lotion as directed Topical Gentamicin Discharge Instruction: apply only in clinic directly to wound bed. Mupirocin Ointment Discharge Instruction: apply only in clinic directly to wound bed. Primary Dressing Hydrofera Blue Ready Foam, 2.5 x2.5 in Discharge Instruction: apply over  the santyl. Secondary Dressing ABD Pad, 8x10 Discharge Instruction: Apply over primary dressing as directed. Woven Gauze Sponge, Non-Sterile 4x4 in Discharge Instruction: Apply over primary dressing as directed. Secured With Compression Wrap FourPress (4 layer compression wrap) Discharge Instruction: Apply four layer compression as directed. May also use Miliken CoFlex 2 layer compression system as alternative. Compression Stockings Add-Ons Electronic Signature(s) Signed: 09/17/2022 5:16:09 PM By: Rhae Hammock RN Entered By: Rhae Hammock on 09/15/2022 13:07:41 -------------------------------------------------------------------------------- Vitals Details Patient Name: Date of Service: [name] Lax [name]. 09/15/2022 12:45 PM Medical Record Number: [id number] Patient Account Number: [id number] Date of Birth/Sex: Treating RN: 04-Dec-1960 (61 y.o. [name], [name] Primary Care Ariani Seier: Jenean Lindau Other Clinician: Referring Jacora Hopkins: Treating Berdena Cisek/Extender: Elise Benne in Treatment: 27 Vital Signs Time Taken: 12:58 Temperature (F): 98 Height (in): 66 Pulse (bpm): 101 Weight (lbs): 339 Respiratory Rate (breaths/min): 17 Body Mass Index (BMI): 54.7 Blood Pressure (mmHg): 131/79 Reference Range: 80 - 120 mg / dl Electronic Signature(s) Signed: 09/17/2022 5:16:09 PM By: Rhae Hammock RN Entered By: Rhae Hammock on 09/15/2022 12:59:55

## 2022-09-22 ENCOUNTER — Encounter (HOSPITAL_BASED_OUTPATIENT_CLINIC_OR_DEPARTMENT_OTHER): Payer: 59 | Admitting: Internal Medicine

## 2022-09-22 DIAGNOSIS — E11621 Type 2 diabetes mellitus with foot ulcer: Secondary | ICD-10-CM | POA: Diagnosis not present

## 2022-09-22 DIAGNOSIS — L97522 Non-pressure chronic ulcer of other part of left foot with fat layer exposed: Secondary | ICD-10-CM

## 2022-09-24 NOTE — Progress Notes (Signed)
Brumbaugh, [name] ([id number]) [id number] Page 1 of 10 Visit Report for 09/22/2022 Chief Complaint Document Details Patient Name: Date of Service: [name] [name]. 09/22/2022 1:00 PM Medical Record Number: [id number] Patient Account Number: [id number] Date of Birth/Sex: Treating RN: 01/06/1961 (61 y.o. F) Primary Care Provider: Jenean Lindau Other Clinician: Referring Provider: Treating Provider/Extender: Elise Benne in Treatment: 28 Information Obtained from: Patient Chief Complaint 03/05/2022; left foot wound Electronic Signature(s) Signed: 09/22/2022 1:28:29 PM By: Kalman Shan DO Entered By: Kalman Shan on 09/22/2022 13:22:56 -------------------------------------------------------------------------------- Debridement Details Patient Name: Date of Service: [name] [name]. 09/22/2022 1:00 PM Medical Record Number: [id number] Patient Account Number: [id number] Date of Birth/Sex: Treating RN: [birth year] (61 y.o. [name], [name] Primary Care Provider: Jenean Lindau Other Clinician: Referring Provider: Treating Provider/Extender: Elise Benne in Treatment: 28 Debridement Performed for Assessment: Wound #2 Left,Distal Calcaneus Performed By: Physician Kalman Shan, DO Debridement Type: Debridement Severity of Tissue Pre Debridement: Fat layer exposed Level of Consciousness (Pre-procedure): Awake and Alert Pre-procedure Verification/Time Out Yes - 13:22 Taken: Start Time: 13:22 Pain Control: Lidocaine T Area Debrided (L x W): otal 1.7 (cm) x 1.9 (cm) = 3.23 (cm) Tissue and other material debrided: Viable, Non-Viable, Slough, Subcutaneous, Slough Level: Skin/Subcutaneous Tissue Debridement Description: Excisional Instrument: Curette Bleeding: Minimum Hemostasis Achieved: Pressure End Time: 13:22 Procedural Pain: 0 Post Procedural Pain: 0 Response to Treatment:  Procedure was tolerated well Level of Consciousness (Post- Awake and Alert procedure): Post Debridement Measurements of Total Wound Length: (cm) 1.7 Width: (cm) 1.9 Depth: (cm) 0.1 Volume: (cm) 0.254 Character of Wound/Ulcer Post Debridement: Improved Severity of Tissue Post Debridement: Fat layer exposed T[place]tham, [name] ([id number].pdf Page 2 of 10 Post Procedure Diagnosis Same as Pre-procedure Electronic Signature(s) Signed: 09/22/2022 1:28:29 PM By: Kalman Shan DO Signed: 09/23/2022 5:39:23 PM By: Rhae Hammock RN Entered By: Rhae Hammock on 09/22/2022 13:22:53 -------------------------------------------------------------------------------- HPI Details Patient Name: Date of Service: HUFFMA [name], [name]. 09/22/2022 1:00 PM Medical Record Number: [id number] Patient Account Number: [id number] Date of Birth/Sex: Treating RN: Sep 24, 1961 (61 y.o. F) Primary Care Provider: Jenean Lindau Other Clinician: Referring Provider: Treating Provider/Extender: Elise Benne in Treatment: 28 History of Present Illness HPI Description: Admission 03/05/2022 [name] is a 61 year old female with a past medical history of uncontrolled insulin-dependent type 2 diabetes, tobacco user and chronic diastolic heart failure that presents to the clinic for a 51-month history of wound to her left heel. She states she had a left ankle fusion in September 2022. She states that she always had a wound after the surgery and it never healed. She has home health and they have been doing compression wraps along with silver alginate to the wound bed. She currently denies signs of infection. 6/8; this is a 61 year old woman with type 2 diabetes. She developed a wound on her left Achilles heel just above the tip of the heel in the setting of recurrent ankle surgeries in late 2022. I have seen some of these results from either the cast or  the surgical boots that are put on after these operations. She thinks this may be the case. And a sense of pressure ulcer. In any case that she has been using Santyl Hydrofera Blue under compression. She is not wearing any footwear at home as she cannot find anything to accommodate the wrap 6/22; patient presents for follow-up. She has home health that comes out once a week to help with dressing  changes. She has no issues or complaints today. We have been using Hydrofera Blue under compression therapy. 7/6; patient presents for follow-up. She states that home health did not come out for the past 2 weeks. It is unclear why. We have been using Hydrofera Blue and Santyl under compression therapy. 7/13; patient presents for follow-up. She did not take the oral antibiotics prescribed at last clinic visit. We have been using Hydrofera Blue with gentamicin/mupirocin ointment under compression therapy. She has no issues or complaints today. She denies signs of infection. 7/20; patient presents for follow-up. We have been using Hydrofera Blue with antibiotic ointment under 3 layer compression. She has home health that change the dressing once. They put collagen on the wound bed. She also reports falling yesterday and hitting her right foot. She has no pain to the area today. 7/27; patient presents for follow-up. We have been using Hydrofera Blue under 3 layer compression. She has home health that changes the dressing. She has no issues or complaints today. 8/3; patient presents for follow-up. We continues to use Hydrofera Blue under 3 layer compression. She has no issues or complaints today. She has been approved for Epicord at 100%. 8/11; patient presents for follow-up. We have been using Hydrofera Blue under 3 layer compression. She has been approved for Epicord and we have this today. She is in agreement with having this applied. 8/18; patient presents for follow-up. Epicord #1 was placed in standard  fashion last clinic visit. She has no issues or complaints today. 9/18; Unfortunately patient has missed her last clinic appointments due to falling and breaking her right ankle. We have been following her for her left ankle wound. She has also developed a large blister to the left heel over the past week that has ruptured and dried. She currently resides In [facility]. Wound9/25; patient presents for follow-up. We have been using Hydrofera Blue and Santyl to the original left ankle wound and Xeroform to the dried blistered. We have been wrapping her with compression therapy. She resides in a facility and they changed the wrap once this past week. She has no issues or complaints today. She denies signs of infection. 10/3; patient presents for follow-up. We have been using Xeroform to the heel wound and Epicort to the left ankle wound All under compression therapy. She has no issues or complaints today. 10/10; patient presents for follow-up. We have been using Epicort to the left ankle wound and Santyl and Hydrofera Blue to the heel wound. All under compression therapy. she has no issues or complaints today. 10/16; patient presents for follow-up. Epicort was placed in standard fashion to the superior wound and onto the heel and Santyl and Hydrofera Blue. She states that the wrap got wet during her shower and her facility was able to rewrap with Kerlix/Coban. 10/23; patient presents for follow-up. Epicord was placed to the wound beds last clinic visit. We continue Kerlix/Coban. She has no issues or complaints today. 10/31; Patient presents for follow-up. Epicord was placed to the wound beds last clinic visit. The original wound is almost healed. We have done this under Kerlix/Coban. She denies signs of infection. Ikner, Sonia [name] ([id number]) [id number].pdf Page 3 of 10 11/10; patient presents for follow-up. Patient's last epi cord was placed in standard fashion at last clinic  visit. The original wound has healed. She still has a heel wound. Grafix was approved however too costly for the patient. We will rerun epi cord to see if she can have more  applications. She had done very well with this. We have been using Hydrofera Blue to the heel under Kerlix/Coban. 11/21; patient presents for follow-up. We have been using Hydrofera Blue and Santyl to the heel under Kerlix/Coban. She switched to a new healthcare insurance and again The skin substitute is too costly for the patient. She denies signs of infection. 11/28; patient presents for follow-up. Her facility did not change the wrap last clinic visit due to lack of staffing. The wrap was taken off and Hydrofera Blue dressing has been in place for the past week. She has no issues or complaints today. 12/5; patient presents for follow-up. She states she has had more drainage over the past week. They did not change the wrap at her facility. She states the nurse will be back this week. She is also been doing a lot of physical therapy. She has been using the Prevalon boot while in bed. 12/12; patient presents for follow-up. We have been using Hydrofera Blue with antibiotic ointment under 4-layer compression. She is tolerated the wrap well. She states she is using her Prevalon boot while in the wheelchair. She has no issues or complaints today. There is been improvement in wound healing. 12/19; patient presents for follow-up. We have been using Hydrofera Blue and antibiotic ointment to the wound bed under 4-layer compression. Her facility change the wrap once. She states she has been using the Prevalon boot in bed but not in the wheelchair due to issues with transferring. Overall there is still improvement in wound healing. Electronic Signature(s) Signed: 09/22/2022 1:28:29 PM By: Kalman Shan DO Entered By: Kalman Shan on 09/22/2022  13:23:50 -------------------------------------------------------------------------------- Physical Exam Details Patient Name: Date of Service: [name] MY [name]. 09/22/2022 1:00 PM Medical Record Number: [id number] Patient Account Number: [id number] Date of Birth/Sex: Treating RN: [birth year] (61 y.o. F) Primary Care Provider: Jenean Lindau Other Clinician: Referring Provider: Treating Provider/Extender: Elise Benne in Treatment: 28 Constitutional respirations regular, non-labored and within target range for patient.. Cardiovascular 2+ dorsalis pedis/posterior tibialis pulses. Psychiatric pleasant and cooperative. Notes Left foot: T the heel there is granulation tissue and slough with epithelization to the surrounding edges. No surrounding signs of infection. Good edema control. o Electronic Signature(s) Signed: 09/22/2022 1:28:29 PM By: Kalman Shan DO Entered By: Kalman Shan on 09/22/2022 13:24:44 -------------------------------------------------------------------------------- Physician Orders Details Patient Name: Date of Service: HUFFMA [name], [name]. 09/22/2022 1:00 PM Medical Record Number: [id number] Patient Account Number: [id number] Date of Birth/Sex: Treating RN: 05/15/1961 (61 y.o. [name], [name] Primary Care Provider: Jenean Lindau Other Clinician: Referring Provider: Treating Provider/Extender: Elise Benne in Treatment: 28 Verbal / Phone Orders: No Diagnosis Coding [place], Farida [name] ([id number]) [phone].pdf Page 4 of 10 Follow-up Appointments ppointment in 1 week. - Dr. Heber [place] Return A ppointment in 2 weeks. - Dr. Heber [place] Return A Other: - facility to continue to change weekly and wound center to change weekly. Physical Therapy to be mindful no pressure to left heel wound. Patient to use more prevalon boot in bed and wheelchair. Do not transfer when in  wheelchair. Anesthetic (In clinic) Topical Lidocaine 5% applied to wound bed Cellular or Tissue Based Products Cellular or Tissue Based Product Type: - Run IVR for EpiFix and Epicord=100% covered 05/07/22: Epicord ordered daptic or Mepitel. (DO NOT REMOVE). - Cellular or Tissue Based Product applied to wound bed, secured with steri-strips, cover with A Epicord #1 05/15/25 Epicord #2 05/22/22 Epicord #3 06/29/2022 Epicord #4 07/07/2022 Epifix #5 07/14/22  Epicord #6 07/20/22 Epicord # 7 07/27/22 ***Run for Grafix***PENDING Epicord #8 08/04/2022 Bathing/ Shower/ Hygiene May shower with protection but do not get wound dressing(s) wet. - May use cast protector bag from [facility], [facility] or [employer] Edema Control - Lymphedema / SCD / Other Elevate legs to the level of the heart or above for 30 minutes daily and/or when sitting, a frequency of: Avoid standing for long periods of time. Off-Loading Prevalon Boot - use while in bed and chair. Additional Orders / Instructions Follow Nutritious Diet - Monitor/Control Blood Sugar Wound Treatment Wound #2 - Calcaneus Wound Laterality: Left, Distal Cleanser: Soap and Water 2 x Per Week/30 Days Discharge Instructions: May shower and wash wound with dial antibacterial soap and water prior to dressing change. Cleanser: Wound Cleanser 2 x Per Week/30 Days Discharge Instructions: Cleanse the wound with wound cleanser prior to applying a clean dressing using gauze sponges, not tissue or cotton balls. Peri-Wound Care: Sween Lotion (Moisturizing lotion) 2 x Per Week/30 Days Discharge Instructions: Apply moisturizing lotion as directed Topical: Gentamicin 2 x Per Week/30 Days Discharge Instructions: apply only in clinic directly to wound bed. Topical: Mupirocin Ointment 2 x Per Week/30 Days Discharge Instructions: apply only in clinic directly to wound bed. Prim Dressing: Hydrofera Blue Ready Foam, 2.5 x2.5 in 2 x Per Week/30 Days ary Discharge Instructions:  apply over the santyl. Secondary Dressing: ABD Pad, 8x10 2 x Per Week/30 Days Discharge Instructions: Apply over primary dressing as directed. Secondary Dressing: Woven Gauze Sponge, Non-Sterile 4x4 in (Home Health) 2 x Per Week/30 Days Discharge Instructions: Apply over primary dressing as directed. Compression Wrap: FourPress (4 layer compression wrap) 2 x Per Week/30 Days Discharge Instructions: Apply four layer compression as directed. May also use Miliken CoFlex 2 layer compression system as alternative. Electronic Signature(s) Signed: 09/22/2022 1:28:29 PM By: Kalman Shan DO Entered By: Kalman Shan on 09/22/2022 13:24:54 [place], Desirie [name] ([id number].pdf Page 5 of 10 -------------------------------------------------------------------------------- Problem List Details Patient Name: Date of Service: [name] [name]. 09/22/2022 1:00 PM Medical Record Number: [id number] Patient Account Number: [id number] Date of Birth/Sex: Treating RN: 04/02/1961 (61 y.o. F) Primary Care Provider: Jenean Lindau Other Clinician: Referring Provider: Treating Provider/Extender: Elise Benne in Treatment: 28 Active Problems ICD-10 Encounter Code Description Active Date MDM Diagnosis L97.522 Non-pressure chronic ulcer of other part of left foot with fat layer exposed 03/05/2022 No Yes E11.621 Type 2 diabetes mellitus with foot ulcer 03/05/2022 No Yes E66.01 Morbid (severe) obesity due to excess calories 03/05/2022 No Yes Inactive Problems Resolved Problems Electronic Signature(s) Signed: 09/22/2022 1:28:29 PM By: Kalman Shan DO Entered By: Kalman Shan on 09/22/2022 13:22:41 -------------------------------------------------------------------------------- Progress Note Details Patient Name: Date of Service: [name] [name]. 09/22/2022 1:00 PM Medical Record Number: [id number] Patient Account Number: [id number] Date of  Birth/Sex: Treating RN: 11/05/1960 (61 y.o. F) Primary Care Provider: Jenean Lindau Other Clinician: Referring Provider: Treating Provider/Extender: Elise Benne in Treatment: 28 Subjective Chief Complaint Information obtained from Patient 03/05/2022; left foot wound History of Present Illness (HPI) Admission 03/05/2022 Ms. Aaradhya Murry is a 61 year old female with a past medical history of uncontrolled insulin-dependent type 2 diabetes, tobacco user and chronic diastolic heart failure that presents to the clinic for a 1-month history of wound to her left heel. She states she had a left ankle fusion in September 2022. She states that she always had a wound after the surgery and it never healed. She has home health and they  have been doing compression wraps along with silver alginate to the wound bed. She currently denies signs of infection. 6/8; this is a 61 year old woman with type 2 diabetes. She developed a wound on her left Achilles heel just above the tip of the heel in the setting of recurrent ankle surgeries in late 2022. I have seen some of these results from either the cast or the surgical boots that are put on after these operations. She thinks this may be the case. And a sense of pressure ulcer. In any case that she has been using Santyl Hydrofera Blue under compression. She is not wearing any footwear at home as she cannot find anything to accommodate the wrap [place]le[place]en, Lynessa [name] ([id number]) [id number].pdf Page 6 of 10 6/22; patient presents for follow-up. She has home health that comes out once a week to help with dressing changes. She has no issues or complaints today. We have been using Hydrofera Blue under compression therapy. 7/6; patient presents for follow-up. She states that home health did not come out for the past 2 weeks. It is unclear why. We have been using Hydrofera Blue and Santyl under compression therapy. 7/13;  patient presents for follow-up. She did not take the oral antibiotics prescribed at last clinic visit. We have been using Hydrofera Blue with gentamicin/mupirocin ointment under compression therapy. She has no issues or complaints today. She denies signs of infection. 7/20; patient presents for follow-up. We have been using Hydrofera Blue with antibiotic ointment under 3 layer compression. She has home health that change the dressing once. They put collagen on the wound bed. She also reports falling yesterday and hitting her right foot. She has no pain to the area today. 7/27; patient presents for follow-up. We have been using Hydrofera Blue under 3 layer compression. She has home health that changes the dressing. She has no issues or complaints today. 8/3; patient presents for follow-up. We continues to use Hydrofera Blue under 3 layer compression. She has no issues or complaints today. She has been approved for Epicord at 100%. 8/11; patient presents for follow-up. We have been using Hydrofera Blue under 3 layer compression. She has been approved for Epicord and we have this today. She is in agreement with having this applied. 8/18; patient presents for follow-up. Epicord #1 was placed in standard fashion last clinic visit. She has no issues or complaints today. 9/18; Unfortunately patient has missed her last clinic appointments due to falling and breaking her right ankle. We have been following her for her left ankle wound. She has also developed a large blister to the left heel over the past week that has ruptured and dried. She currently resides In [facility]. Wound9/25; patient presents for follow-up. We have been using Hydrofera Blue and Santyl to the original left ankle wound and Xeroform to the dried blistered. We have been wrapping her with compression therapy. She resides in a facility and they changed the wrap once this past week. She has no issues or complaints today. She denies signs  of infection. 10/3; patient presents for follow-up. We have been using Xeroform to the heel wound and Epicort to the left ankle wound All under compression therapy. She has no issues or complaints today. 10/10; patient presents for follow-up. We have been using Epicort to the left ankle wound and Santyl and Hydrofera Blue to the heel wound. All under compression therapy. she has no issues or complaints today. 10/16; patient presents for follow-up. Epicort was placed in standard fashion  to the superior wound and onto the heel and Santyl and Hydrofera Blue. She states that the wrap got wet during her shower and her facility was able to rewrap with Kerlix/Coban. 10/23; patient presents for follow-up. Epicord was placed to the wound beds last clinic visit. We continue Kerlix/Coban. She has no issues or complaints today. 10/31; Patient presents for follow-up. Epicord was placed to the wound beds last clinic visit. The original wound is almost healed. We have done this under Kerlix/Coban. She denies signs of infection. 11/10; patient presents for follow-up. Patient's last epi cord was placed in standard fashion at last clinic visit. The original wound has healed. She still has a heel wound. Grafix was approved however too costly for the patient. We will rerun epi cord to see if she can have more applications. She had done very well with this. We have been using Hydrofera Blue to the heel under Kerlix/Coban. 11/21; patient presents for follow-up. We have been using Hydrofera Blue and Santyl to the heel under Kerlix/Coban. She switched to a new healthcare insurance and again The skin substitute is too costly for the patient. She denies signs of infection. 11/28; patient presents for follow-up. Her facility did not change the wrap last clinic visit due to lack of staffing. The wrap was taken off and Hydrofera Blue dressing has been in place for the past week. She has no issues or complaints today. 12/5;  patient presents for follow-up. She states she has had more drainage over the past week. They did not change the wrap at her facility. She states the nurse will be back this week. She is also been doing a lot of physical therapy. She has been using the Prevalon boot while in bed. 12/12; patient presents for follow-up. We have been using Hydrofera Blue with antibiotic ointment under 4-layer compression. She is tolerated the wrap well. She states she is using her Prevalon boot while in the wheelchair. She has no issues or complaints today. There is been improvement in wound healing. 12/19; patient presents for follow-up. We have been using Hydrofera Blue and antibiotic ointment to the wound bed under 4-layer compression. Her facility change the wrap once. She states she has been using the Prevalon boot in bed but not in the wheelchair due to issues with transferring. Overall there is still improvement in wound healing. Patient History Information obtained from Patient, Chart. Family History Cancer - Father,Mother, Diabetes - Mother,Maternal Grandparents,Paternal Grandparents, Heart Disease - Siblings,Maternal Grandparents, Hypertension - Siblings, Tuberculosis - Maternal Grandparents,Paternal Grandparents. Social History Current every day smoker, Marital Status - Single, Alcohol Use - Never, Drug Use - No History, Caffeine Use - Daily. Medical History Hematologic/Lymphatic Patient has history of Anemia Respiratory Patient has history of Chronic Obstructive Pulmonary Disease (COPD) Cardiovascular Patient has history of Congestive Heart Failure - Weighs self daily, Hypertension, Myocardial Infarction, Peripheral Venous Disease Endocrine Patient has history of Type II Diabetes Neurologic Patient has history of Neuropathy Medical A Surgical History Notes nd Gastrointestinal GERD, Peptic Ulcer Disease Musculoskeletal Ozment, Maniya [name] ([id number]) [id number].pdf Page 7 of  10 Broke left leg 3 years ago, 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 Vitals Time Taken: 12:49 PM, Height: 66 in, Weight: 339 lbs, BMI: 54.7, Temperature: 97.9 F, Pulse: 91 bpm, Respiratory Rate: 17 breaths/min, Blood Pressure: 131/81 mmHg. Cardiovascular 2+ dorsalis pedis/posterior tibialis pulses. Psychiatric pleasant and cooperative. General Notes: Left foot: T the heel there is granulation tissue  and slough with epithelization to the surrounding edges. No surrounding signs of infection. Good o edema control. Integumentary (Hair, Skin) Wound #2 status is Open. Original cause of wound was Blister. The date acquired was: 06/15/2022. The wound has been in treatment 12 weeks. The wound is located on the Left,Distal Calcaneus. The wound measures 1.7cm length x 1.9cm width x 0.1cm depth; 2.537cm^2 area and 0.254cm^3 volume. There is Fat Layer (Subcutaneous Tissue) exposed. There is no tunneling or undermining noted. There is a medium amount of serosanguineous drainage noted. The wound margin is distinct with the outline attached to the wound base. There is large (67-100%) red, pink granulation within the wound bed. There is a small (1-33%) amount of necrotic tissue within the wound bed including Adherent Slough. The periwound skin appearance had no abnormalities noted for texture. The periwound skin appearance had no abnormalities noted for color. The periwound skin appearance did not exhibit: Dry/Scaly, Maceration. Periwound temperature was noted as No Abnormality. Assessment Active Problems ICD-10 Non-pressure chronic ulcer of other part of left foot with fat layer exposed Type 2 diabetes mellitus with foot ulcer Morbid (severe) obesity due to excess calories Patient's wound has shown improvement in size) since last clinic visit. I debrided nonviable tissue. I recommended  continue the course with antibiotic ointment and Hydrofera Blue under 4-layer compression. Follow-up in 1 week. 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 There was a Excisional Skin/Subcutaneous Tissue Debridement with a total area of 3.23 sq cm performed by Kalman Shan, DO. With the following instrument(s): Curette to remove Viable and Non-Viable tissue/material. Material removed includes Subcutaneous Tissue and Slough and after achieving pain control using Lidocaine. No specimens were taken. A time out was conducted at 13:22, prior to the start of the procedure. A Minimum amount of bleeding was controlled with Pressure. The procedure was tolerated well with a pain level of 0 throughout and a pain level of 0 following the procedure. Post Debridement Measurements: 1.7cm length x 1.9cm width x 0.1cm depth; 0.254cm^3 volume. Character of Wound/Ulcer Post Debridement is improved. Severity of Tissue Post Debridement is: Fat layer exposed. 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 There was a Four Layer Compression Therapy Procedure by Rhae Hammock, RN. Post procedure Diagnosis Wound #2: Same as Pre-Procedure Plan Raju, Ryin [name] ([id number].pdf Page 8 of 10 Follow-up Appointments: Return Appointment in 1 week. - Dr. Heber Montgomery Return Appointment in 2 weeks. - Dr. Heber Dillingham Other: - facility to continue to change weekly and wound center to change weekly. Physical Therapy to be mindful no pressure to left heel wound. Patient to use more prevalon boot in bed and wheelchair. Do not transfer when in wheelchair. Anesthetic: (In clinic) Topical Lidocaine 5% applied to  wound bed Cellular or Tissue Based Products: Cellular or Tissue Based Product Type: - Run IVR for EpiFix and Epicord=100% covered 05/07/22: Epicord ordered Cellular or Tissue Based Product applied to wound bed, secured with steri-strips, cover with Adaptic or Mepitel. (DO NOT REMOVE). - Epicord #1 05/15/25 Epicord #2 05/22/22 Epicord #3 06/29/2022 Epicord #4 07/07/2022 Epifix #5 07/14/22 Epicord #6 07/20/22 Epicord # 7 07/27/22 ***Run for Grafix***PENDING Epicord #8 08/04/2022 Bathing/ Shower/ Hygiene: May shower with protection but do not get wound dressing(s) wet. - May use cast  protector bag from [facility], [facility] or [employer] Edema Control - Lymphedema / SCD / Other: Elevate legs to the level of the heart or above for 30 minutes daily and/or when sitting, a frequency of: Avoid standing for long periods of time. Off-Loading: Prevalon Boot - use while in bed and chair. Additional Orders / Instructions: Follow Nutritious Diet - Monitor/Control Blood Sugar WOUND #2: - Calcaneus Wound Laterality: Left, Distal Cleanser: Soap and Water 2 x Per Week/30 Days Discharge Instructions: May shower and wash wound with dial antibacterial soap and water prior to dressing change. Cleanser: Wound Cleanser 2 x Per Week/30 Days Discharge Instructions: Cleanse the wound with wound cleanser prior to applying a clean dressing using gauze sponges, not tissue or cotton balls. Peri-Wound Care: Sween Lotion (Moisturizing lotion) 2 x Per Week/30 Days Discharge Instructions: Apply moisturizing lotion as directed Topical: Gentamicin 2 x Per Week/30 Days Discharge Instructions: apply only in clinic directly to wound bed. Topical: Mupirocin Ointment 2 x Per Week/30 Days Discharge Instructions: apply only in clinic directly to wound bed. Prim Dressing: Hydrofera Blue Ready Foam, 2.5 x2.5 in 2 x Per Week/30 Days ary Discharge Instructions: apply over the santyl. Secondary Dressing: ABD Pad, 8x10 2 x Per Week/30 Days Discharge  Instructions: Apply over primary dressing as directed. Secondary Dressing: Woven Gauze Sponge, Non-Sterile 4x4 in (Home Health) 2 x Per Week/30 Days Discharge Instructions: Apply over primary dressing as directed. Com pression Wrap: FourPress (4 layer compression wrap) 2 x Per Week/30 Days Discharge Instructions: Apply four layer compression as directed. May also use Miliken CoFlex 2 layer compression system as alternative. 1. In office sharp debridement 2. Antibiotic ointment with Hydrofera Blue under 4-layer compression 3. Follow-up in 1 week Electronic Signature(s) Signed: 09/22/2022 1:28:29 PM By: Kalman Shan DO Entered By: Kalman Shan on 09/22/2022 13:27:28 -------------------------------------------------------------------------------- HxROS Details Patient Name: Date of Service: HUFFMA [name], [name]. 09/22/2022 1:00 PM Medical Record Number: [id number] Patient Account Number: [id number] Date of Birth/Sex: Treating RN: [birth year] (61 y.o. F) Primary Care Provider: Jenean Lindau Other Clinician: Referring Provider: Treating Provider/Extender: Elise Benne in Treatment: 28 Information Obtained From Patient Chart Hematologic/Lymphatic Medical History: Positive for: Anemia Respiratory Medical History: Positive for: Chronic Obstructive Pulmonary Disease (COPD) Goar, [name] ([id number]) [phone].pdf Page 9 of 10 Cardiovascular Medical History: Positive for: Congestive Heart Failure - Weighs self daily; Hypertension; Myocardial Infarction; Peripheral Venous Disease Gastrointestinal Medical History: Past Medical History Notes: GERD, Peptic Ulcer Disease Endocrine Medical History: Positive for: Type II Diabetes Time with diabetes: 16 years Treated with: Insulin, Oral agents Blood sugar tested every day: Yes Tested : Twice daily Musculoskeletal Medical History: Past Medical History Notes: Broke left leg 3  years ago, 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, foot fused Neurologic Medical History: Positive for: Neuropathy Psychiatric Medical History: Past Medical History Notes: Depression Immunizations Pneumococcal Vaccine: Received Pneumococcal Vaccination: No Implantable Devices None Family and Social History Cancer: Yes - Father,Mother; Diabetes: Yes - Mother,Maternal Grandparents,Paternal Grandparents; Heart Disease: Yes - Siblings,Maternal Grandparents; Hypertension: Yes - Siblings; Tuberculosis: Yes - Maternal Grandparents,Paternal Grandparents; Current every day smoker; Marital Status - Single; Alcohol Use: Never; Drug Use: No History; Caffeine Use: Daily; Financial Concerns: No; Food, Clothing or Shelter Needs: No; Support System Lacking: No; Transportation Concerns: No Electronic Signature(s) Signed: 09/22/2022 1:28:29 PM By: Kalman Shan DO Entered By: Kalman Shan on 09/22/2022 13:23:56 -------------------------------------------------------------------------------- SuperBill Details Patient Name: Date of Service: [name] [name]. 09/22/2022 Medical Record Number: [id number] Patient Account Number: [id number] Date of Birth/Sex: Treating  RN: 1961/01/23 (61 y.o. [name], [name] Primary Care Provider: Jenean Lindau Other Clinician: Referring Provider: Treating Provider/Extender: Elise Benne in Treatment: 28 Diagnosis Coding ICD-10 Codes Code Description Wheatley, [name] ([id number]) [phone].pdf Page 10 of 10 [id number] Non-pressure chronic ulcer of other part of left foot with fat layer exposed E11.621 Type 2 diabetes mellitus with foot ulcer E66.01 Morbid (severe) obesity due to excess calories Facility Procedures : CPT4 Code: [id number] Description: [id number] - DEB SUBQ TISSUE 20 SQ CM/< ICD-10 Diagnosis Description L97.522 Non-pressure chronic ulcer of other part of left foot with fat layer exposed Modifier: Quantity:  1 Physician Procedures : CPT4 Code Description Modifier [id number] - WC PHYS SUBQ TISS 20 SQ CM ICD-10 Diagnosis Description L97.522 Non-pressure chronic ulcer of other part of left foot with fat layer exposed Quantity: 1 Electronic Signature(s) Signed: 09/22/2022 1:28:29 PM By: Kalman Shan DO Entered By: Kalman Shan on 09/22/2022 13:27:48

## 2022-09-24 NOTE — Progress Notes (Addendum)
Stoy, Brizza C [id number] Page 1 of 8 Visit Report for 09/22/2022 Arrival Information Details Patient Name: Date of Service: [name] C. 09/22/2022 1:00 PM Medical Record Number: [id number] Patient Account Number: [id number] Date of Birth/Sex: Treating RN: 1961-05-01 (61 y.o. [name], [name] Primary Care Fleetwood Pierron: Jenean Lindau Other Clinician: Referring Toney Lizaola: Treating Chaley Castellanos/Extender: Elise Benne in Treatment: 28 Visit Information History Since Last Visit Added or deleted any medications: No Patient Arrived: Wheel Chair Any new allergies or adverse reactions: No Arrival Time: 12:47 Had [name] fall or experienced change in No Accompanied By: self activities of daily living that may affect Transfer Assistance: None risk of falls: Patient Identification Verified: Yes Signs or symptoms of abuse/neglect since last visito No Secondary Verification Process Completed: Yes Hospitalized since last visit: No Patient Requires Transmission-Based Precautions: No Implantable device outside of the clinic excluding No Patient Has Alerts: No cellular tissue based products placed in the center since last visit: Has Dressing in Place as Prescribed: Yes Has Compression in Place as Prescribed: Yes Pain Present Now: Yes Electronic Signature(s) Signed: 09/23/2022 5:39:23 PM By: Rhae Hammock RN Entered By: Rhae Hammock on 09/22/2022 12:47:49 -------------------------------------------------------------------------------- Complex / Palliative Patient Assessment Details Patient Name: Date of Service: [name], [name] MY C. 09/22/2022 1:00 PM Medical Record Number: [id number] Patient Account Number: [id number] Date of Birth/Sex: Treating RN: [birth year] (61 y.o. [name] Primary Care Andi Layfield: Jenean Lindau Other Clinician: Referring Miya Luviano: Treating Asuncion Tapscott/Extender: Elise Benne in Treatment: 28 Complex Wound Management Criteria Patient has remarkable or complex co-morbidities requiring medications or treatments that extend wound healing times. Examples: Diabetes mellitus with chronic renal failure or end stage renal disease requiring dialysis Advanced or poorly controlled rheumatoid arthritis Diabetes mellitus and end stage chronic obstructive pulmonary disease Active cancer with current chemo- or radiation therapy CHF, HTN, MI, DM type II, PVD, hx left foot fx- fusion limited mobility to left foot. obesity Palliative Wound Management Criteria Care Approach Wound Care Plan: Complex Wound Management Electronic Signature(s) Signed: 10/09/2022 4:43:27 PM By: Deon Pilling RN, BSN Signed: 11/23/2022 4:03:05 PM By: Kalman Shan DO Entered By: Deon Pilling on 10/09/2022 16:43:26 Peil, Treniyah C ([id number] Page 2 of 8 -------------------------------------------------------------------------------- Compression Therapy Details Patient Name: Date of Service: [name] C. 09/22/2022 1:00 PM Medical Record Number: [id number] Patient Account Number: [id number] Date of Birth/Sex: Treating RN: September 16, 1961 (61 y.o. [name], [name] Primary Care Javid Kemler: Jenean Lindau Other Clinician: Referring Vernard Gram: Treating Aubryanna Nesheim/Extender: Elise Benne in Treatment: 28 Compression Therapy Performed for Wound Assessment: Wound #2 Left,Distal Calcaneus Performed By: Clinician Rhae Hammock, RN Compression Type: Four Layer Post Procedure Diagnosis Same as Pre-procedure Electronic Signature(s) Signed: 09/23/2022 5:39:23 PM By: Rhae Hammock RN Entered By: Rhae Hammock on 09/22/2022 13:20:56 -------------------------------------------------------------------------------- Encounter Discharge Information Details Patient Name: Date of Service: [name] C. 09/22/2022 1:00  PM Medical Record Number: [id number] Patient Account Number: [id number] Date of Birth/Sex: Treating RN: 1961-02-28 (61 y.o. [name], [name] Primary Care Crysten Kaman: Jenean Lindau Other Clinician: Referring Libia Fazzini: Treating Bayard More/Extender: Elise Benne in Treatment: 28 Encounter Discharge Information Items Post Procedure Vitals Discharge Condition: Stable Temperature (F): 98.7 Ambulatory Status: Wheelchair Pulse (bpm): 74 Discharge Destination: Home Respiratory Rate (breaths/min): 17 Transportation: Private Auto Blood Pressure (mmHg): 134/74 Accompanied By: self Schedule Follow-up Appointment: Yes Clinical Summary of Care: Patient Declined Electronic Signature(s) Signed: 09/23/2022 5:39:23 PM By: Rhae Hammock RN Entered By: Rhae Hammock on 09/22/2022  13:23:41 -------------------------------------------------------------------------------- Lower Extremity Assessment Details Patient Name: Date of Service: [name] C. 09/22/2022 1:00 PM Medical Record Number: [id number] Patient Account Number: [id number] Date of Birth/Sex: Treating RN: 16-Apr-1961 (61 y.o. [name], [name] Primary Care Edan Juday: Jenean Lindau Other Clinician: Referring Enslee Bibbins: Treating Lisaann Atha/Extender: Elise Benne in Treatment: [address], [place] C ([id number]) [id number] Page 3 of 8 Edema Assessment Assessed: [Left: Yes] [Right: No] Edema: [Left: Ye] [Right: s] Calf Left: Right: Point of Measurement: 28 cm From Medial Instep 50.5 cm Ankle Left: Right: Point of Measurement: 3 cm From Medial Instep 30.5 cm Vascular Assessment Pulses: Dorsalis Pedis Palpable: [Left:Yes] Posterior Tibial Palpable: [Left:Yes] Electronic Signature(s) Signed: 09/22/2022 3:33:43 PM By: Erenest Blank Signed: 09/23/2022 5:39:23 PM By: Rhae Hammock RN Entered By: Erenest Blank on 09/22/2022  12:59:17 -------------------------------------------------------------------------------- Multi Wound Chart Details Patient Name: Date of Service: [name] C. 09/22/2022 1:00 PM Medical Record Number: [id number] Patient Account Number: [id number] Date of Birth/Sex: Treating RN: [birth year] (61 y.o. F) Primary Care Dylen Mcelhannon: Jenean Lindau Other Clinician: Referring Cozy Veale: Treating Yukiko Minnich/Extender: Elise Benne in Treatment: 28 Vital Signs Height(in): 66 Pulse(bpm): 91 Weight(lbs): 339 Blood Pressure(mmHg): 131/81 Body Mass Index(BMI): 54.7 Temperature(F): 97.9 Respiratory Rate(breaths/min): 17 [2:Photos:] [N/[name]:N/[name]] Left, Distal Calcaneus N/[name] N/[name] Wound Location: Blister N/[name] N/[name] Wounding Event: Diabetic Wound/Ulcer of the Lower N/[name] N/[name] Primary Etiology: Extremity Anemia, Chronic Obstructive N/[name] N/[name] Comorbid History: Pulmonary Disease (COPD), Congestive Heart Failure, Hypertension, Myocardial Infarction, Peripheral Venous Disease, Type II Diabetes, Neuropathy 06/15/2022 N/[name] N/[name] Date Acquired: 12 N/[name] N/[name] Weeks of Treatment: Open N/[name] N/[name] Wound Status: Placide, Whitnee C ([id number] Page 4 of 8 No N/[name] N/[name] Wound Recurrence: 1.7x1.9x0.1 N/[name] N/[name] Measurements L x W x D (cm) 2.537 N/[name] N/[name] (cm) : rea 0.254 N/[name] N/[name] Volume (cm) : 82.10% N/[name] N/[name] % Reduction in [name] rea: 82.00% N/[name] N/[name] % Reduction in Volume: Grade 2 N/[name] N/[name] Classification: Medium N/[name] N/[name] Exudate [name] mount: Serosanguineous N/[name] N/[name] Exudate Type: red, brown N/[name] N/[name] Exudate Color: Distinct, outline attached N/[name] N/[name] Wound Margin: Large (67-100%) N/[name] N/[name] Granulation [name] mount: Red, Pink N/[name] N/[name] Granulation Quality: Small (1-33%) N/[name] N/[name] Necrotic [name] mount: Fat Layer (Subcutaneous Tissue): Yes N/[name] N/[name] Exposed Structures: Fascia: No Tendon: No Muscle: No Joint: No Bone: No Medium (34-66%) N/[name] N/[name] Epithelialization: No  Abnormalities Noted N/[name] N/[name] Periwound Skin Texture: Maceration: No N/[name] N/[name] Periwound Skin Moisture: Dry/Scaly: No No Abnormalities Noted N/[name] N/[name] Periwound Skin Color: No Abnormality N/[name] N/[name] Temperature: Compression Therapy N/[name] N/[name] Procedures Performed: Treatment Notes Electronic Signature(s) Signed: 09/22/2022 1:28:29 PM By: Kalman Shan DO Entered By: Kalman Shan on 09/22/2022 13:22:47 -------------------------------------------------------------------------------- Multi-Disciplinary Care Plan Details Patient Name: Date of Service: [name] C. 09/22/2022 1:00 PM Medical Record Number: [id number] Patient Account Number: [id number] Date of Birth/Sex: Treating RN: August 08, 1961 (61 y.o. [name], [name] Primary Care Jalyn Rosero: Jenean Lindau Other Clinician: Referring Kaliegh Willadsen: Treating Brenn Deziel/Extender: Elise Benne in Treatment: 28 Active Inactive Abuse / Safety / Falls / Self Care Management Nursing Diagnoses: History of Falls Goals: Patient will not experience any injury related to falls Date Initiated: 03/05/2022 Target Resolution Date: 11/06/2022 Goal Status: Active Interventions: Assess fall risk on admission and as needed Assess: immobility, friction, shearing, incontinence upon admission and as needed Assess impairment of mobility on admission and as needed per policy Notes: [id number]: Fall prevention ongoing, recent fall. Wound/Skin Impairment Nursing Diagnoses: Impaired tissue integrity Haire, Marialuisa C ([id number]) [id number] Page  5 of 8 Goals: Patient/caregiver will verbalize understanding of skin care regimen Date Initiated: 03/05/2022 Target Resolution Date: 11/06/2022 Goal Status: Active Ulcer/skin breakdown will have [name] volume reduction of 30% by week 4 Date Initiated: 03/05/2022 Date Inactivated: 04/30/2022 Target Resolution Date: 05/02/2022 Goal Status: Met Ulcer/skin breakdown will have [name]  volume reduction of 50% by week 8 Date Initiated: 04/30/2022 Date Inactivated: 07/07/2022 Target Resolution Date: 07/04/2022 Unmet Reason: patient sent three Goal Status: Unmet weeks inpatient and rehab before returning to wound center. Interventions: Assess patient/caregiver ability to obtain necessary supplies Assess patient/caregiver ability to perform ulcer/skin care regimen upon admission and as needed Assess ulceration(s) every visit Provide education on smoking Provide education on ulcer and skin care Treatment Activities: Topical wound management initiated : 03/05/2022 Notes: 04/30/22: Wound care regimen continues. Electronic Signature(s) Signed: 09/23/2022 5:39:23 PM By: Rhae Hammock RN Entered By: Rhae Hammock on 09/22/2022 13:20:20 -------------------------------------------------------------------------------- Pain Assessment Details Patient Name: Date of Service: [name] MY C. 09/22/2022 1:00 PM Medical Record Number: [id number] Patient Account Number: [id number] Date of Birth/Sex: Treating RN: 1961-05-28 (61 y.o. [name], [name] Primary Care Kiyona Mcnall: Jenean Lindau Other Clinician: Referring Caroline Matters: Treating Vivian Neuwirth/Extender: Elise Benne in Treatment: 28 Active Problems Location of Pain Severity and Description of Pain Patient Has Paino Yes Site Locations Pain Location: Pain in Ulcers With Dressing Change: Yes Duration of the Pain. Constant / Intermittento Intermittent Rate the pain. Current Pain Level: 6 Worst Pain Level: 10 Least Pain Level: 0 Tolerable Pain Level: 6 Character of Pain Describe the Pain: Aching Pain Management and Medication Current Pain Management: Moroni, Rihana C ([id number] Page 6 of 8 Medication: No Cold Application: No Rest: No Massage: No Activity: No T.E.N.S.: No Heat Application: No Leg drop or elevation: No Is the Current Pain  Management Adequate: Adequate How does your wound impact your activities of daily livingo Sleep: No Bathing: No Appetite: No Relationship With Others: No Bladder Continence: No Emotions: No Bowel Continence: No Work: No Toileting: No Drive: No Dressing: No Hobbies: No Electronic Signature(s) Signed: 09/23/2022 5:39:23 PM By: Rhae Hammock RN Entered By: Rhae Hammock on 09/22/2022 12:49:56 -------------------------------------------------------------------------------- Patient/Caregiver Education Details Patient Name: Date of Service: [name] 12/19/2023andnbsp1:00 PM Medical Record Number: [id number] Patient Account Number: [id number] Date of Birth/Gender: Treating RN: 26-Mar-1961 (61 y.o. [name], [name] Primary Care Physician: Jenean Lindau Other Clinician: Referring Physician: Treating Physician/Extender: Elise Benne in Treatment: 28 Education Assessment Education Provided To: Patient Education Topics Provided Wound/Skin Impairment: Methods: Explain/Verbal Responses: Reinforcements needed, State content correctly [employer]) Signed: 09/23/2022 5:39:23 PM By: Rhae Hammock RN Entered By: Rhae Hammock on 09/22/2022 13:20:33 -------------------------------------------------------------------------------- Wound Assessment Details Patient Name: Date of Service: [name] C. 09/22/2022 1:00 PM Medical Record Number: [id number] Patient Account Number: [id number] Date of Birth/Sex: Treating RN: [birth year] (61 y.o. [name], [name] Primary Care Eladia Frame: Jenean Lindau Other Clinician: Referring Breion Novacek: Treating Niki Cosman/Extender: Elise Benne in Treatment: 28 Wound Status Wound Number: 2 Primary Diabetic Wound/Ulcer of the Lower Extremity Etiology: Wound Location: Left, Distal Calcaneus Wound Open Wounding Event: Blister Status: Date Acquired:  06/15/2022 Mehring, Felisha C ([id number]) [id number] Page 7 of 8 Date Acquired: 06/15/2022 Comorbid Anemia, Chronic Obstructive Pulmonary Disease (COPD), Weeks Of Treatment: 12 History: Congestive Heart Failure, Hypertension, Myocardial Infarction, Clustered Wound: No Peripheral Venous Disease, Type II Diabetes, Neuropathy Photos Wound Measurements Length: (cm) 1.7 Width: (cm) 1.9 Depth: (cm) 0.1 Area: (cm) 2.537 Volume: (cm) 0.254 %  Reduction in Area: 82.1% % Reduction in Volume: 82% Epithelialization: Medium (34-66%) Tunneling: No Undermining: No Wound Description Classification: Grade 2 Wound Margin: Distinct, outline attached Exudate Amount: Medium Exudate Type: Serosanguineous Exudate Color: red, brown Foul Odor After Cleansing: No Slough/Fibrino Yes Wound Bed Granulation Amount: Large (67-100%) Exposed Structure Granulation Quality: Red, Pink Fascia Exposed: No Necrotic Amount: Small (1-33%) Fat Layer (Subcutaneous Tissue) Exposed: Yes Necrotic Quality: Adherent Slough Tendon Exposed: No Muscle Exposed: No Joint Exposed: No Bone Exposed: No Periwound Skin Texture Texture Color No Abnormalities Noted: Yes No Abnormalities Noted: Yes Moisture Temperature / Pain No Abnormalities Noted: No Temperature: No Abnormality Dry / Scaly: No Maceration: No Electronic Signature(s) Signed: 09/22/2022 3:33:43 PM By: Erenest Blank Signed: 09/23/2022 5:39:23 PM By: Rhae Hammock RN Entered By: Erenest Blank on 09/22/2022 12:56:50 -------------------------------------------------------------------------------- Vitals Details Patient Name: Date of Service: [name] C. 09/22/2022 1:00 PM Medical Record Number: [id number] Patient Account Number: [id number] Date of Birth/Sex: Treating RN: [birth year] (61 y.o. [name], [name] Primary Care Kyley Laurel: Jenean Lindau Other Clinician: Referring Francesco Provencal: Treating Gila Lauf/Extender:  Elise Benne in Treatment: 28 Vital Signs [place], Kerrington C ([id number]) [phone].pdf Page 8 of 8 Time Taken: 12:49 Temperature (F): 97.9 Height (in): 66 Pulse (bpm): 91 Weight (lbs): 339 Respiratory Rate (breaths/min): 17 Body Mass Index (BMI): 54.7 Blood Pressure (mmHg): 131/81 Reference Range: 80 - 120 mg / dl Electronic Signature(s) Signed: 09/23/2022 5:39:23 PM By: Rhae Hammock RN Entered By: Rhae Hammock on 09/22/2022 12:49:51

## 2022-09-29 ENCOUNTER — Encounter (HOSPITAL_BASED_OUTPATIENT_CLINIC_OR_DEPARTMENT_OTHER): Payer: 59 | Admitting: Internal Medicine

## 2022-10-06 ENCOUNTER — Encounter (HOSPITAL_BASED_OUTPATIENT_CLINIC_OR_DEPARTMENT_OTHER): Payer: 59 | Admitting: Internal Medicine

## 2022-10-08 NOTE — Progress Notes (Signed)
Crespin, Sanii [name] ([id number]) [id number] Page 1 of 8 Visit Report for 09/08/2022 Arrival Information Details Patient Name: Date of Service: [name] Lax [name]. 09/08/2022 9:00 A M Medical Record Number: [id number] Patient Account Number: [id number] Date of Birth/Sex: Treating RN: [birth year] (62 y.o. [name] Primary Care Clifton Kovacic: Jenean Lindau Other Clinician: Referring Ciarra Braddy: Treating Feiga Nadel/Extender: Elise Benne in Treatment: 19 Visit Information History Since Last Visit Added or deleted any medications: No Patient Arrived: Wheel Chair Any new allergies or adverse reactions: No Arrival Time: 08:58 Had a fall or experienced change in No Accompanied By: self activities of daily living that may affect Transfer Assistance: None risk of falls: Patient Identification Verified: Yes Signs or symptoms of abuse/neglect since last visito No Secondary Verification Process Completed: Yes Hospitalized since last visit: No Patient Requires Transmission-Based Precautions: No Implantable device outside of the clinic excluding No Patient Has Alerts: No cellular tissue based products placed in the center since last visit: Has Dressing in Place as Prescribed: Yes Has Compression in Place as Prescribed: Yes Pain Present Now: Yes Electronic Signature(s) Signed: 10/07/2022 5:35:40 PM By: Rhae Hammock RN Entered By: Rhae Hammock on 09/08/2022 08:59:04 -------------------------------------------------------------------------------- Compression Therapy Details Patient Name: Date of Service: [name] Lax [name]. 09/08/2022 9:00 A M Medical Record Number: [id number] Patient Account Number: [id number] Date of Birth/Sex: Treating RN: [birth year] (62 y.o. [name] Primary Care Wetona Viramontes: Jenean Lindau Other Clinician: Referring Idalis Hoelting: Treating Ugochukwu Chichester/Extender: Elise Benne in  Treatment: 26 Compression Therapy Performed for Wound Assessment: Wound #2 Left,Distal Calcaneus Performed By: Clinician Deon Pilling, RN Compression Type: Four Layer Post Procedure Diagnosis Same as Pre-procedure Electronic Signature(s) Signed: 09/08/2022 3:03:04 PM By: Deon Pilling RN, BSN Entered By: Deon Pilling on 09/08/2022 09:10:35 Messenger, Joreen [name] ([id number]) [id number] Page 2 of 8 -------------------------------------------------------------------------------- Encounter Discharge Information Details Patient Name: Date of Service: [name] Lax [name]. 09/08/2022 9:00 A M Medical Record Number: [id number] Patient Account Number: [id number] Date of Birth/Sex: Treating RN: January 05, 1961 (62 y.o. [name] Primary Care Milayna Rotenberg: Jenean Lindau Other Clinician: Referring Estefan Pattison: Treating Koven Belinsky/Extender: Elise Benne in Treatment: 26 Encounter Discharge Information Items Post Procedure Vitals Discharge Condition: Stable Temperature (F): 97.8 Ambulatory Status: Wheelchair Pulse (bpm): 101 Discharge Destination: Home Respiratory Rate (breaths/min): 20 Transportation: Private Auto Blood Pressure (mmHg): 172/80 Accompanied By: self Schedule Follow-up Appointment: Yes Clinical Summary of Care: Electronic Signature(s) Signed: 09/08/2022 3:03:04 PM By: Deon Pilling RN, BSN Entered By: Deon Pilling on 09/08/2022 09:11:22 -------------------------------------------------------------------------------- Lower Extremity Assessment Details Patient Name: Date of Service: [name], [name]. 09/08/2022 9:00 A M Medical Record Number: [id number] Patient Account Number: [id number] Date of Birth/Sex: Treating RN: 1960-10-26 (62 y.o. [name] Primary Care Venecia Mehl: Jenean Lindau Other Clinician: Referring Janan Bogie: Treating Naevia Unterreiner/Extender: Elise Benne in Treatment: 26 Edema  Assessment Assessed: Shirlyn Goltz: Yes] Patrice Paradise: No] Edema: [Left: Ye] [Right: s] Calf Left: Right: Point of Measurement: 28 cm From Medial Instep 48 cm Ankle Left: Right: Point of Measurement: 3 cm From Medial Instep 28 cm Vascular Assessment Pulses: Dorsalis Pedis Palpable: [Left:Yes] Posterior Tibial Palpable: [Left:Yes] Electronic Signature(s) Signed: 10/07/2022 5:35:40 PM By: Rhae Hammock RN Entered By: Rhae Hammock on 09/08/2022 08:57:51 Pokorski, Idamae [name] ([id number]) [id number] Page 3 of 8 -------------------------------------------------------------------------------- Multi Wound Chart Details Patient Name: Date of Service: [name] Lax [name]. 09/08/2022 9:00 A M Medical Record Number: [id number] Patient Account Number: [id number] Date of Birth/Sex: Treating RN: Apr 11, 1961 (61  y.o. F) Primary Care Geri Hepler: Jenean Lindau Other Clinician: Referring Mostyn Varnell: Treating Nimah Uphoff/Extender: Elise Benne in Treatment: 26 Vital Signs Height(in): 24 Pulse(bpm): 101 Weight(lbs): 339 Blood Pressure(mmHg): 172/80 Body Mass Index(BMI): 54.7 Temperature(F): 97.8 Respiratory Rate(breaths/min): 17 [2:Photos:] [N/A:N/A] Left, Distal Calcaneus N/A N/A Wound Location: Blister N/A N/A Wounding Event: Diabetic Wound/Ulcer of the Lower N/A N/A Primary Etiology: Extremity Anemia, Chronic Obstructive N/A N/A Comorbid History: Pulmonary Disease (COPD), Congestive Heart Failure, Hypertension, Myocardial Infarction, Peripheral Venous Disease, Type II Diabetes, Neuropathy 06/15/2022 N/A N/A Date Acquired: 10 N/A N/A Weeks of Treatment: Open N/A N/A Wound Status: No N/A N/A Wound Recurrence: 1.7x2.4x0.1 N/A N/A Measurements L x W x D (cm) 3.204 N/A N/A A (cm) : rea 0.32 N/A N/A Volume (cm) : 77.30% N/A N/A % Reduction in A rea: 77.40% N/A N/A % Reduction in Volume: Grade 2 N/A N/A Classification: Medium N/A  N/A Exudate A mount: Serosanguineous N/A N/A Exudate Type: red, brown N/A N/A Exudate Color: Distinct, outline attached N/A N/A Wound Margin: Large (67-100%) N/A N/A Granulation A mount: Red, Pink N/A N/A Granulation Quality: Small (1-33%) N/A N/A Necrotic A mount: Fat Layer (Subcutaneous Tissue): Yes N/A N/A Exposed Structures: Fascia: No Tendon: No Muscle: No Joint: No Bone: No Medium (34-66%) N/A N/A Epithelialization: Debridement - Selective/Open Wound N/A N/A Debridement: Pre-procedure Verification/Time Out 09:00 N/A N/A Taken: Lidocaine 5% topical ointment N/A N/A Pain Control: Slough N/A N/A Tissue Debrided: Non-Viable Tissue N/A N/A Level: 4.08 N/A N/A Debridement A (sq cm): rea Curette N/A N/A Instrument: Minimum N/A N/A Bleeding: Pressure N/A N/A Hemostasis A chieved: 0 N/A N/A Procedural Pain: 0 N/A N/A Post Procedural Pain: Procedure was tolerated well N/A N/A Debridement Treatment Response: 1.7x2.4x0.1 N/A N/A Post Debridement Measurements L x W x D (cm) 0.32 N/A N/A Post Debridement Volume: (cm) No Abnormalities Noted N/A N/A Periwound Skin Texture: Maceration: No N/A N/A Periwound Skin Moisture: Dry/Scaly: No No Abnormalities Noted N/A N/A Periwound Skin Color: Leven, Aizah [name] ([id number]) [id number] Page 4 of 8 No Abnormality N/A N/A Temperature: Compression Therapy N/A N/A Procedures Performed: Debridement Treatment Notes Wound #2 (Calcaneus) Wound Laterality: Left, Distal Cleanser Soap and Water Discharge Instruction: May shower and wash wound with dial antibacterial soap and water prior to dressing change. Wound Cleanser Discharge Instruction: Cleanse the wound with wound cleanser prior to applying a clean dressing using gauze sponges, not tissue or cotton balls. Peri-Wound Care Sween Lotion (Moisturizing lotion) Discharge Instruction: Apply moisturizing lotion as  directed Topical Gentamicin Discharge Instruction: apply only in clinic directly to wound bed. Mupirocin Ointment Discharge Instruction: apply only in clinic directly to wound bed. Primary Dressing Hydrofera Blue Ready Foam, 2.5 x2.5 in Discharge Instruction: apply over the santyl. Secondary Dressing ABD Pad, 8x10 Discharge Instruction: Apply over primary dressing as directed. Woven Gauze Sponge, Non-Sterile 4x4 in Discharge Instruction: Apply over primary dressing as directed. Secured With Compression Wrap FourPress (4 layer compression wrap) Discharge Instruction: Apply four layer compression as directed. May also use Miliken CoFlex 2 layer compression system as alternative. Compression Stockings Add-Ons Electronic Signature(s) Signed: 09/08/2022 11:32:14 AM By: Kalman Shan DO Entered By: Kalman Shan on 09/08/2022 09:16:19 -------------------------------------------------------------------------------- Multi-Disciplinary Care Plan Details Patient Name: Date of Service: [name] Lax [name]. 09/08/2022 9:00 A M Medical Record Number: [id number] Patient Account Number: [id number] Date of Birth/Sex: Treating RN: 1960-11-02 (62 y.o. [name], [name] Primary Care Ketzia Guzek: Jenean Lindau Other Clinician: Referring Sinaya Minogue: Treating Deondrick Searls/Extender: Elise Benne in Treatment: 26 Active  Inactive Abuse / Safety / Falls / Self Care Management Nursing Diagnoses: History of Falls Rountree, Loxley [name] ([id number]) [phone].pdf Page 5 of 8 Goals: Patient will not experience any injury related to falls Date Initiated: 03/05/2022 Target Resolution Date: 11/06/2022 Goal Status: Active Interventions: Assess fall risk on admission and as needed Assess: immobility, friction, shearing, incontinence upon admission and as needed Assess impairment of mobility on admission and as needed per policy Notes: 1/95/09: Fall prevention ongoing,  recent fall. Wound/Skin Impairment Nursing Diagnoses: Impaired tissue integrity Goals: Patient/caregiver will verbalize understanding of skin care regimen Date Initiated: 03/05/2022 Target Resolution Date: 11/06/2022 Goal Status: Active Ulcer/skin breakdown will have a volume reduction of 30% by week 4 Date Initiated: 03/05/2022 Date Inactivated: 04/30/2022 Target Resolution Date: 05/02/2022 Goal Status: Met Ulcer/skin breakdown will have a volume reduction of 50% by week 8 Date Initiated: 04/30/2022 Date Inactivated: 07/07/2022 Target Resolution Date: 07/04/2022 Unmet Reason: patient sent three Goal Status: Unmet weeks inpatient and rehab before returning to wound center. Interventions: Assess patient/caregiver ability to obtain necessary supplies Assess patient/caregiver ability to perform ulcer/skin care regimen upon admission and as needed Assess ulceration(s) every visit Provide education on smoking Provide education on ulcer and skin care Treatment Activities: Topical wound management initiated : 03/05/2022 Notes: 04/30/22: Wound care regimen continues. Electronic Signature(s) Signed: 09/08/2022 3:03:04 PM By: Deon Pilling RN, BSN Entered By: Deon Pilling on 09/08/2022 09:08:28 -------------------------------------------------------------------------------- Pain Assessment Details Patient Name: Date of Service: HUFFMA Delane Ginger, [name]. 09/08/2022 9:00 A M Medical Record Number: [id number] Patient Account Number: [id number] Date of Birth/Sex: Treating RN: 01-03-1961 (62 y.o. [name] Primary Care Wells Mabe: Jenean Lindau Other Clinician: Referring Dorion Petillo: Treating Kenidy Crossland/Extender: Elise Benne in Treatment: 26 Active Problems Location of Pain Severity and Description of Pain Patient Has Paino Patient Unable to Respond Site Locations With Dressing Change: Yes Fiorello, Aidah [name] ([id number]) [id number] Page 6 of  8 Duration of the Pain. Constant / Intermittento Intermittent Rate the pain. Current Pain Level: 5 Worst Pain Level: 10 Least Pain Level: 0 Tolerable Pain Level: 5 Character of Pain Describe the Pain: Aching Pain Management and Medication Current Pain Management: Medication: No Cold Application: No Rest: No Massage: No Activity: No T.E.N.S.: No Heat Application: No Leg drop or elevation: No Is the Current Pain Management Adequate: Adequate How does your wound impact your activities of daily livingo Sleep: No Bathing: No Appetite: No Relationship With Others: No Bladder Continence: No Emotions: No Bowel Continence: No Work: No Toileting: No Drive: No Dressing: No Hobbies: No Electronic Signature(s) Signed: 10/07/2022 5:35:40 PM By: Rhae Hammock RN Entered By: Rhae Hammock on 09/08/2022 08:58:16 -------------------------------------------------------------------------------- Patient/Caregiver Education Details Patient Name: Date of Service: [name]. 12/5/2023andnbsp9:00 A M Medical Record Number: [id number] Patient Account Number: [id number] Date of Birth/Gender: Treating RN: [birth year] (62 y.o. [name] Primary Care Physician: Jenean Lindau Other Clinician: Referring Physician: Treating Physician/Extender: Elise Benne in Treatment: 26 Education Assessment Education Provided To: Patient Education Topics Provided Wound/Skin Impairment: Handouts: Skin Care Do's and Dont's Methods: Explain/Verbal Responses: Reinforcements needed Electronic Signature(s) Signed: 09/08/2022 3:03:04 PM By: Deon Pilling RN, BSN Loughner, Cherlynn [name] ([id number]) PM By: Deon Pilling RN, BSN [id number] Page 7 of 8 Signed: 09/08/2022 3:03:04 Entered By: Deon Pilling on 09/08/2022 09:08:38 -------------------------------------------------------------------------------- Wound Assessment Details Patient Name:  Date of Service: [name] Lax [name]. 09/08/2022 9:00 A M Medical Record Number: [id number] Patient Account Number: [id number] Date of Birth/Sex: Treating RN: 27-Jul-1961 (61  y.o. [name] Primary Care Cori Justus: Jenean Lindau Other Clinician: Referring Welton Bord: Treating Annella Prowell/Extender: Elise Benne in Treatment: 26 Wound Status Wound Number: 2 Primary Diabetic Wound/Ulcer of the Lower Extremity Etiology: Wound Location: Left, Distal Calcaneus Wound Open Wounding Event: Blister Status: Date Acquired: 06/15/2022 Comorbid Anemia, Chronic Obstructive Pulmonary Disease (COPD), Weeks Of Treatment: 10 History: Congestive Heart Failure, Hypertension, Myocardial Infarction, Clustered Wound: No Peripheral Venous Disease, Type II Diabetes, Neuropathy Photos Wound Measurements Length: (cm) 1.7 Width: (cm) 2.4 Depth: (cm) 0.1 Area: (cm) 3.204 Volume: (cm) 0.32 % Reduction in Area: 77.3% % Reduction in Volume: 77.4% Epithelialization: Medium (34-66%) Tunneling: No Undermining: No Wound Description Classification: Grade 2 Wound Margin: Distinct, outline attached Exudate Amount: Medium Exudate Type: Serosanguineous Exudate Color: red, brown Foul Odor After Cleansing: No Slough/Fibrino Yes Wound Bed Granulation Amount: Large (67-100%) Exposed Structure Granulation Quality: Red, Pink Fascia Exposed: No Necrotic Amount: Small (1-33%) Fat Layer (Subcutaneous Tissue) Exposed: Yes Necrotic Quality: Adherent Slough Tendon Exposed: No Muscle Exposed: No Joint Exposed: No Bone Exposed: No Periwound Skin Texture Texture Color No Abnormalities Noted: Yes No Abnormalities Noted: Yes Moisture Temperature / Pain No Abnormalities Noted: No Temperature: No Abnormality Dry / Scaly: No Maceration: No Valbuena, Daylee [name] ([id number]) [id number] Page 8 of 8 Electronic Signature(s) Signed: 09/08/2022 3:03:04 PM By: Deon Pilling RN, BSN Signed: 10/07/2022 5:35:40 PM By: Rhae Hammock RN Entered By: Deon Pilling on 09/08/2022 08:59:51 -------------------------------------------------------------------------------- Vitals Details Patient Name: Date of Service: [name], [name]. 09/08/2022 9:00 A M Medical Record Number: [id number] Patient Account Number: [id number] Date of Birth/Sex: Treating RN: [birth year] (62 y.o. [name] Primary Care Jinna Weinman: Jenean Lindau Other Clinician: Referring Jeremia Groot: Treating Kendric Sindelar/Extender: Elise Benne in Treatment: 26 Vital Signs Time Taken: 08:58 Temperature (F): 97.8 Height (in): 66 Pulse (bpm): 101 Weight (lbs): 339 Respiratory Rate (breaths/min): 17 Body Mass Index (BMI): 54.7 Blood Pressure (mmHg): 172/80 Reference Range: 80 - 120 mg / dl Electronic Signature(s) Signed: 10/07/2022 5:35:40 PM By: Rhae Hammock RN Entered By: Rhae Hammock on 09/08/2022 08:58:43

## 2022-10-13 ENCOUNTER — Ambulatory Visit (HOSPITAL_BASED_OUTPATIENT_CLINIC_OR_DEPARTMENT_OTHER): Payer: 59 | Admitting: Internal Medicine

## 2022-10-20 ENCOUNTER — Encounter (HOSPITAL_BASED_OUTPATIENT_CLINIC_OR_DEPARTMENT_OTHER): Payer: 59 | Attending: Internal Medicine | Admitting: Internal Medicine

## 2022-10-20 DIAGNOSIS — E11621 Type 2 diabetes mellitus with foot ulcer: Secondary | ICD-10-CM

## 2022-10-20 DIAGNOSIS — I5032 Chronic diastolic (congestive) heart failure: Secondary | ICD-10-CM | POA: Diagnosis not present

## 2022-10-20 DIAGNOSIS — I110 Hypertensive heart disease with heart failure: Secondary | ICD-10-CM | POA: Diagnosis not present

## 2022-10-20 DIAGNOSIS — L97522 Non-pressure chronic ulcer of other part of left foot with fat layer exposed: Secondary | ICD-10-CM | POA: Diagnosis present

## 2022-10-20 DIAGNOSIS — F17200 Nicotine dependence, unspecified, uncomplicated: Secondary | ICD-10-CM | POA: Insufficient documentation

## 2022-10-20 DIAGNOSIS — Z794 Long term (current) use of insulin: Secondary | ICD-10-CM | POA: Insufficient documentation

## 2022-10-20 DIAGNOSIS — Z6841 Body Mass Index (BMI) 40.0 and over, adult: Secondary | ICD-10-CM | POA: Diagnosis not present

## 2022-10-20 NOTE — Progress Notes (Addendum)
[name], [name] ([id number]) [id number].pdf Page 1 of 10 Visit Report for 10/20/2022 Chief Complaint Document Details Patient Name: Date of Service: [name] Lax C. 10/20/2022 3:00 PM Medical Record Number: [id number] Patient Account Number: [id number] Date of Birth/Sex: Treating RN: 08/19/1961 (62 y.o. F) Primary Care Provider: Jenean [name] Other Clinician: Referring Provider: Treating Provider/Extender: [name]: 69 Information Obtained from: Patient Chief Complaint 03/05/2022; left foot wound Electronic Signature(s) Signed: 10/20/2022 3:36:41 PM By: [name] Shan DO Entered By: [name] on 10/20/2022 15:19:15 -------------------------------------------------------------------------------- Debridement Details Patient Name: Date of Service: [name] Lax C. 10/20/2022 3:00 PM Medical Record Number: [id number] Patient Account Number: [id number] Date of Birth/Sex: Treating RN: May 14, 1961 (62 y.o. [name], [name] Primary Care Provider: Jenean [name] Other Clinician: Referring Provider: Treating Provider/Extender: [name]: 32 Debridement Performed for Assessment: Wound #2 Left,Distal Calcaneus Performed By: Physician [name] Shan, DO Debridement Type: Debridement Severity of Tissue Pre Debridement: Fat layer exposed Level of Consciousness (Pre-procedure): Awake and Alert Pre-procedure Verification/Time Out Yes - 15:11 Taken: Start Time: 15:11 Pain Control: Lidocaine T Area Debrided (L x W): otal 2.3 (cm) x 3.5 (cm) = 8.05 (cm) Tissue and other material debrided: Viable, Non-Viable, Slough, Subcutaneous, Slough Level: Skin/Subcutaneous Tissue Debridement Description: Excisional Instrument: Curette Bleeding: Minimum Hemostasis Achieved: Pressure End Time: 15:11 Procedural Pain: 0 Post Procedural Pain: 0 Response to [name]:  Procedure was tolerated well Level of Consciousness (Post- Awake and Alert procedure): Post Debridement Measurements of Total Wound Length: (cm) 2.3 Width: (cm) 3.5 Depth: (cm) 0.1 Volume: (cm) 0.632 Character of Wound/Ulcer Post Debridement: Improved Severity of Tissue Post Debridement: Fat layer exposed [name], [name] ([id number] Page 2 of 10 Post Procedure Diagnosis Same as Pre-procedure Electronic Signature(s) Signed: 10/22/2022 11:30:24 AM By: [name] Shan DO Signed: 11/23/2022 10:40:58 AM By: [name] Hammock RN Entered By: [name] on 10/20/2022 15:37:06 -------------------------------------------------------------------------------- HPI Details Patient Name: Date of Service: [name], [name] MY C. 10/20/2022 3:00 PM Medical Record Number: [id number] Patient Account Number: [id number] Date of Birth/Sex: Treating RN: Jul 08, 1961 (62 y.o. F) Primary Care Provider: Jenean [name] Other Clinician: Referring Provider: Treating Provider/Extender: [name]: 70 History of Present Illness HPI Description: Admission 03/05/2022 [name] is [name] 62 year old female with [name] past medical history of uncontrolled insulin-dependent type 2 diabetes, tobacco user and chronic diastolic heart failure that presents to the clinic for [name] 75-monthhistory of wound to her left heel. She states she had [name] left ankle fusion in September 2022. She states that she always had [name] wound after the surgery and it never healed. She has home health and they have been doing compression wraps along with silver alginate to the wound bed. She currently denies signs of infection. 6/8; this is [name] 62year old woman with type 2 diabetes. She developed [name] wound on her left Achilles heel just above the tip of the heel in the setting of recurrent ankle surgeries in late 2022. I have seen some of these results from either the cast or  the surgical boots that are put on after these operations. She thinks this may be the case. And [name] sense of pressure ulcer. In any case that she has been using Santyl Hydrofera Blue under compression. She is not wearing any footwear at home as she cannot find anything to accommodate the wrap 6/22; patient presents for follow-up. She has home health that comes out once [name] week to help with dressing  changes. She has no issues or complaints today. We have been using Hydrofera Blue under compression therapy. 7/6; patient presents for follow-up. She states that home health did not come out for the past 2 weeks. It is unclear why. We have been using Hydrofera Blue and Santyl under compression therapy. 7/13; patient presents for follow-up. She did not take the oral antibiotics prescribed at last clinic visit. We have been using Hydrofera Blue with gentamicin/mupirocin ointment under compression therapy. She has no issues or complaints today. She denies signs of infection. 7/20; patient presents for follow-up. We have been using Hydrofera Blue with antibiotic ointment under 3 layer compression. She has home health that change the dressing once. They put collagen on the wound bed. She also reports falling yesterday and hitting her right foot. She has no pain to the area today. 7/27; patient presents for follow-up. We have been using Hydrofera Blue under 3 layer compression. She has home health that changes the dressing. She has no issues or complaints today. 8/3; patient presents for follow-up. We continues to use Hydrofera Blue under 3 layer compression. She has no issues or complaints today. She has been approved for Epicord at 100%. 8/11; patient presents for follow-up. We have been using Hydrofera Blue under 3 layer compression. She has been approved for Epicord and we have this today. She is in agreement with having this applied. 8/18; patient presents for follow-up. Epicord #1 was placed in standard  fashion last clinic visit. She has no issues or complaints today. 9/18; Unfortunately patient has missed her last clinic appointments due to falling and breaking her right ankle. We have been following her for her left ankle wound. She has also developed [name] large blister to the left heel over the past week that has ruptured and dried. She currently resides In [facility]. Wound9/25; patient presents for follow-up. We have been using Hydrofera Blue and Santyl to the original left ankle wound and Xeroform to the dried blistered. We have been wrapping her with compression therapy. She resides in [name] facility and they changed the wrap once this past week. She has no issues or complaints today. She denies signs of infection. 10/3; patient presents for follow-up. We have been using Xeroform to the heel wound and Epicort to the left ankle wound All under compression therapy. She has no issues or complaints today. 10/10; patient presents for follow-up. We have been using Epicort to the left ankle wound and Santyl and Hydrofera Blue to the heel wound. All under compression therapy. she has no issues or complaints today. 10/16; patient presents for follow-up. Epicort was placed in standard fashion to the superior wound and onto the heel and Santyl and Hydrofera Blue. She states that the wrap got wet during her shower and her facility was able to rewrap with Kerlix/Coban. 10/23; patient presents for follow-up. Epicord was placed to the wound beds last clinic visit. We continue Kerlix/Coban. She has no issues or complaints today. 10/31; Patient presents for follow-up. Epicord was placed to the wound beds last clinic visit. The original wound is almost healed. We have done this under Kerlix/Coban. She denies signs of infection. [name], [name] ([id number]) [id number].pdf Page 3 of 10 11/10; patient presents for follow-up. Patient's last epi cord was placed in standard fashion at last clinic  visit. The original wound has healed. She still has [name] heel wound. Grafix was approved however too costly for the patient. We will rerun epi cord to see if she can have more  applications. She had done very well with this. We have been using Hydrofera Blue to the heel under Kerlix/Coban. 11/21; patient presents for follow-up. We have been using Hydrofera Blue and Santyl to the heel under Kerlix/Coban. She switched to [name] new healthcare insurance and again The skin substitute is too costly for the patient. She denies signs of infection. 11/28; patient presents for follow-up. Her facility did not change the wrap last clinic visit due to lack of staffing. The wrap was taken off and Hydrofera Blue dressing has been in place for the past week. She has no issues or complaints today. 12/5; patient presents for follow-up. She states she has had more drainage over the past week. They did not change the wrap at her facility. She states the nurse will be back this week. She is also been doing [name] lot of physical therapy. She has been using the Prevalon boot while in bed. 12/12; patient presents for follow-up. We have been using Hydrofera Blue with antibiotic ointment under 4-layer compression. She is tolerated the wrap well. She states she is using her Prevalon boot while in the wheelchair. She has no issues or complaints today. There is been improvement in wound healing. 12/19; patient presents for follow-up. We have been using Hydrofera Blue and antibiotic ointment to the wound bed under 4-layer compression. Her facility change the wrap once. She states she has been using the Prevalon boot in bed but not in the wheelchair due to issues with transferring. Overall there is still improvement in wound healing. 1/16; patient presents for follow-up. She missed her last clinic appointment. We have been using Hydrofera Blue and antibiotic ointment under 4-layer compression. At the facility this has only been changed twice  over the past 3 weeks. They are not using 4-layer compression. She came in with Kerlix/Coban. Electronic Signature(s) Signed: 10/20/2022 3:36:41 PM By: [name] Shan DO Entered By: [name] on 10/20/2022 15:33:20 -------------------------------------------------------------------------------- Physical Exam Details Patient Name: Date of Service: [name] Neighbor MY C. 10/20/2022 3:00 PM Medical Record Number: [id number] Patient Account Number: [id number] Date of Birth/Sex: Treating RN: [birth year] (62 y.o. F) Primary Care Provider: Jenean [name] Other Clinician: Referring Provider: Treating Provider/Extender: [name]: 32 Constitutional respirations regular, non-labored and within target range for patient.. Cardiovascular 2+ dorsalis pedis/posterior tibialis pulses. Psychiatric pleasant and cooperative. Notes Left foot: T the heel there is granulation tissue and slough with epithelization to the surrounding edges. No surrounding signs of infection. o Electronic Signature(s) Signed: 10/20/2022 3:36:41 PM By: [name] Shan DO Entered By: [name] on 10/20/2022 15:33:48 -------------------------------------------------------------------------------- Physician Orders Details Patient Name: Date of Service: [name], [name] MY C. 10/20/2022 3:00 PM Medical Record Number: [id number] Patient Account Number: [id number] Date of Birth/Sex: Treating RN: 1961-06-23 (62 y.o. [name], [name] Primary Care Provider: Jenean [name] Other Clinician: Referring Provider: Treating Provider/Extender: [name]: 32 Verbal / Phone Orders: No [name], [name] ([id number]) [phone].pdf Page 4 of 10 Diagnosis Coding Follow-up Appointments ppointment in 1 week. - Dr. Heber [place] next Tuesday 10/07/22 @ 1:30 Rm # 8 w/ [name] Return [name] ppointment in 2 weeks. - Dr. Heber [place] (go ahead  and make appt.) Return [name] Other: - facility to continue to change weekly and wound center to change weekly. Physical Therapy to be mindful no pressure to left heel wound. Patient to use more prevalon boot in bed and wheelchair. Do not transfer when in wheelchair. Anesthetic (In clinic) Topical Lidocaine 5% applied to wound  bed Cellular or Tissue Based Products Cellular or Tissue Based Product Type: - Run IVR for EpiFix and Epicord=100% covered 05/07/22: Epicord ordered daptic or Mepitel. (DO NOT REMOVE). - Cellular or Tissue Based Product applied to wound bed, secured with steri-strips, cover with [name] Epicord #1 05/15/25 Epicord #2 05/22/22 Epicord #3 06/29/2022 Epicord #4 07/07/2022 Epifix #5 07/14/22 Epicord #6 07/20/22 Epicord # 7 07/27/22 ***Run for Grafix***PENDING Epicord #8 08/04/2022 Edema Control - Lymphedema / SCD / Other Avoid standing for long periods of time. Off-Loading Prevalon Boot - use while in bed and chair. Additional Orders / Instructions Follow Nutritious Diet - Monitor/Control Blood Sugar Wound [name] Wound #2 - Calcaneus Wound Laterality: Left, Distal Cleanser: Soap and Water 2 x Per Week/30 Days Discharge Instructions: May shower and wash wound with dial antibacterial soap and water prior to dressing change. Cleanser: Wound Cleanser 2 x Per Week/30 Days Discharge Instructions: Cleanse the wound with wound cleanser prior to applying [name] clean dressing using gauze sponges, not tissue or cotton balls. Peri-Wound Care: Sween Lotion (Moisturizing lotion) 2 x Per Week/30 Days Discharge Instructions: Apply moisturizing lotion as directed Prim Dressing: Santyl Ointment 2 x Per Week/30 Days ary Discharge Instructions: Apply nickel thick amount to wound bed as instructed Prim Dressing: Hydrofera Blue Ready Foam, 2.5 x2.5 in 2 x Per Week/30 Days ary Discharge Instructions: apply over the santyl. Secondary Dressing: ABD Pad, 8x10 2 x Per Week/30 Days Discharge  Instructions: Apply over primary dressing as directed. Secondary Dressing: Woven Gauze Sponge, Non-Sterile 4x4 in (Home Health) 2 x Per Week/30 Days Discharge Instructions: Apply over primary dressing as directed. Compression Wrap: FourPress (4 layer compression wrap) 2 x Per Week/30 Days Discharge Instructions: Apply four layer compression as directed. May also use Miliken CoFlex 2 layer compression system as alternative. Electronic Signature(s) Signed: 10/20/2022 3:36:41 PM By: [name] Shan DO Entered By: [name] on 10/20/2022 15:34:40 -------------------------------------------------------------------------------- Problem List Details Patient Name: Date of Service: [name], [name] MY C. 10/20/2022 3:00 PM [name], [name] ([id number]) [phone].pdf Page 5 of 10 Medical Record Number: [id number] Patient Account Number: [id number] Date of Birth/Sex: Treating RN: 1961-05-09 (62 y.o. F) Primary Care Provider: Jenean [name] Other Clinician: Referring Provider: Treating Provider/Extender: [name]: 69 Active Problems ICD-10 Encounter Code Description Active Date MDM Diagnosis L97.522 Non-pressure chronic ulcer of other part of left foot with fat layer exposed 03/05/2022 No Yes E11.621 Type 2 diabetes mellitus with foot ulcer 03/05/2022 No Yes E66.01 Morbid (severe) obesity due to excess calories 03/05/2022 No Yes Inactive Problems Resolved Problems Electronic Signature(s) Signed: 10/20/2022 3:36:41 PM By: [name] Shan DO Entered By: [name] on 10/20/2022 15:19:03 -------------------------------------------------------------------------------- Progress Note Details Patient Name: Date of Service: [name], [name] MY C. 10/20/2022 3:00 PM Medical Record Number: [id number] Patient Account Number: [id number] Date of Birth/Sex: Treating RN: [birth year] (62 y.o. F) Primary Care Provider: Jenean [name]  Other Clinician: Referring Provider: Treating Provider/Extender: [name]: 70 Subjective Chief Complaint Information obtained from Patient 03/05/2022; left foot wound History of Present Illness (HPI) Admission 03/05/2022 Ms. Lis Butala is [name] 62 year old female with [name] past medical history of uncontrolled insulin-dependent type 2 diabetes, tobacco user and chronic diastolic heart failure that presents to the clinic for [name] 60-monthhistory of wound to her left heel. She states she had [name] left ankle fusion in September 2022. She states that she always had [name] wound after the surgery and it never healed. She has home health and  they have been doing compression wraps along with silver alginate to the wound bed. She currently denies signs of infection. 6/8; this is [name] 62 year old woman with type 2 diabetes. She developed [name] wound on her left Achilles heel just above the tip of the heel in the setting of recurrent ankle surgeries in late 2022. I have seen some of these results from either the cast or the surgical boots that are put on after these operations. She thinks this may be the case. And [name] sense of pressure ulcer. In any case that she has been using Santyl Hydrofera Blue under compression. She is not wearing any footwear at home as she cannot find anything to accommodate the wrap 6/22; patient presents for follow-up. She has home health that comes out once [name] week to help with dressing changes. She has no issues or complaints today. We have been using Hydrofera Blue under compression therapy. 7/6; patient presents for follow-up. She states that home health did not come out for the past 2 weeks. It is unclear why. We have been using Hydrofera Blue and Santyl under compression therapy. 7/13; patient presents for follow-up. She did not take the oral antibiotics prescribed at last clinic visit. We have been using Hydrofera Blue with gentamicin/mupirocin ointment  under compression therapy. She has no issues or complaints today. She denies signs of infection. 7/20; patient presents for follow-up. We have been using Hydrofera Blue with antibiotic ointment under 3 layer compression. She has home health that change Cappuccio, Lesieli C ([id number]) [phone].pdf Page 6 of 10 the dressing once. They put collagen on the wound bed. She also reports falling yesterday and hitting her right foot. She has no pain to the area today. 7/27; patient presents for follow-up. We have been using Hydrofera Blue under 3 layer compression. She has home health that changes the dressing. She has no issues or complaints today. 8/3; patient presents for follow-up. We continues to use Hydrofera Blue under 3 layer compression. She has no issues or complaints today. She has been approved for Epicord at 100%. 8/11; patient presents for follow-up. We have been using Hydrofera Blue under 3 layer compression. She has been approved for Epicord and we have this today. She is in agreement with having this applied. 8/18; patient presents for follow-up. Epicord #1 was placed in standard fashion last clinic visit. She has no issues or complaints today. 9/18; Unfortunately patient has missed her last clinic appointments due to falling and breaking her right ankle. We have been following her for her left ankle wound. She has also developed [name] large blister to the left heel over the past week that has ruptured and dried. She currently resides In [facility]. Wound9/25; patient presents for follow-up. We have been using Hydrofera Blue and Santyl to the original left ankle wound and Xeroform to the dried blistered. We have been wrapping her with compression therapy. She resides in [name] facility and they changed the wrap once this past week. She has no issues or complaints today. She denies signs of infection. 10/3; patient presents for follow-up. We have been using Xeroform to the  heel wound and Epicort to the left ankle wound All under compression therapy. She has no issues or complaints today. 10/10; patient presents for follow-up. We have been using Epicort to the left ankle wound and Santyl and Hydrofera Blue to the heel wound. All under compression therapy. she has no issues or complaints today. 10/16; patient presents for follow-up. Epicort was placed in standard  fashion to the superior wound and onto the heel and Santyl and Hydrofera Blue. She states that the wrap got wet during her shower and her facility was able to rewrap with Kerlix/Coban. 10/23; patient presents for follow-up. Epicord was placed to the wound beds last clinic visit. We continue Kerlix/Coban. She has no issues or complaints today. 10/31; Patient presents for follow-up. Epicord was placed to the wound beds last clinic visit. The original wound is almost healed. We have done this under Kerlix/Coban. She denies signs of infection. 11/10; patient presents for follow-up. Patient's last epi cord was placed in standard fashion at last clinic visit. The original wound has healed. She still has [name] heel wound. Grafix was approved however too costly for the patient. We will rerun epi cord to see if she can have more applications. She had done very well with this. We have been using Hydrofera Blue to the heel under Kerlix/Coban. 11/21; patient presents for follow-up. We have been using Hydrofera Blue and Santyl to the heel under Kerlix/Coban. She switched to [name] new healthcare insurance and again The skin substitute is too costly for the patient. She denies signs of infection. 11/28; patient presents for follow-up. Her facility did not change the wrap last clinic visit due to lack of staffing. The wrap was taken off and Hydrofera Blue dressing has been in place for the past week. She has no issues or complaints today. 12/5; patient presents for follow-up. She states she has had more drainage over the past week.  They did not change the wrap at her facility. She states the nurse will be back this week. She is also been doing [name] lot of physical therapy. She has been using the Prevalon boot while in bed. 12/12; patient presents for follow-up. We have been using Hydrofera Blue with antibiotic ointment under 4-layer compression. She is tolerated the wrap well. She states she is using her Prevalon boot while in the wheelchair. She has no issues or complaints today. There is been improvement in wound healing. 12/19; patient presents for follow-up. We have been using Hydrofera Blue and antibiotic ointment to the wound bed under 4-layer compression. Her facility change the wrap once. She states she has been using the Prevalon boot in bed but not in the wheelchair due to issues with transferring. Overall there is still improvement in wound healing. 1/16; patient presents for follow-up. She missed her last clinic appointment. We have been using Hydrofera Blue and antibiotic ointment under 4-layer compression. At the facility this has only been changed twice over the past 3 weeks. They are not using 4-layer compression. She came in with Kerlix/Coban. Patient History Information obtained from Patient, Chart. Family History Cancer - Father,Mother, Diabetes - Mother,Maternal Grandparents,Paternal Grandparents, Heart Disease - Siblings,Maternal Grandparents, Hypertension - Siblings, Tuberculosis - Maternal Grandparents,Paternal Grandparents. Social History Current every day smoker, Marital Status - Single, Alcohol Use - Never, Drug Use - No History, Caffeine Use - Daily. Medical History Hematologic/Lymphatic Patient has history of Anemia Respiratory Patient has history of Chronic Obstructive Pulmonary Disease (COPD) Cardiovascular Patient has history of Congestive Heart Failure - Weighs self daily, Hypertension, Myocardial Infarction, Peripheral Venous Disease Endocrine Patient has history of Type II  Diabetes Neurologic Patient has history of Neuropathy Medical [name] Surgical History Notes nd Gastrointestinal GERD, Peptic Ulcer Disease Musculoskeletal Broke left leg 3 years ago, 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, foot fused Psychiatric Depression Orban, Deja C ([id number] Page 7 of 10 Objective Constitutional respirations regular, non-labored and within target range for  patient.. Vitals Time Taken: 2:52 PM, Height: 66 in, Weight: 339 lbs, BMI: 54.7, Temperature: 98.1 F, Pulse: 75 bpm, Respiratory Rate: 17 breaths/min, Blood Pressure: 123/80 mmHg. Cardiovascular 2+ dorsalis pedis/posterior tibialis pulses. Psychiatric pleasant and cooperative. General Notes: Left foot: T the heel there is granulation tissue and slough with epithelization to the surrounding edges. No surrounding signs of infection. o Integumentary (Hair, Skin) Wound #2 status is Open. Original cause of wound was Blister. The date acquired was: 06/15/2022. The wound has been in [name] 16 weeks. The wound is located on the Left,Distal Calcaneus. The wound measures 2.3cm length x 3.5cm width x 0.1cm depth; 6.322cm^2 area and 0.632cm^3 volume. There is Fat Layer (Subcutaneous Tissue) exposed. There is [name] medium amount of serosanguineous drainage noted. The wound margin is distinct with the outline attached to the wound base. There is large (67-100%) red, pink granulation within the wound bed. There is [name] small (1-33%) amount of necrotic tissue within the wound bed including Adherent Slough. The periwound skin appearance had no abnormalities noted for texture. The periwound skin appearance had no abnormalities noted for color. The periwound skin appearance did not exhibit: Dry/Scaly, Maceration. Periwound temperature was noted as No Abnormality. 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 She has not been here in almost [name] month. I recommended she follow-up weekly. I debrided nonviable tissue. I recommended Santyl and Hydrofera Blue under 4-layer compression to the left lower extremity. Follow-up in 1 week. Procedures Wound #2 Pre-procedure diagnosis of Wound #2 is [name] Diabetic Wound/Ulcer of the Lower Extremity located on the Left,Distal Calcaneus . There was [name] Four Layer Compression Therapy Procedure by [name] Hammock, RN. Post procedure Diagnosis Wound #2: Same as Pre-Procedure Plan Follow-up Appointments: Return Appointment in 1 week. - Dr. Heber [facility] next Tuesday 10/07/22 @ 1:30 Rm # 8 w/ [name] Return Appointment in 2 weeks. - Dr. Heber [place] (go ahead and make appt.) Other: - facility to continue to change weekly and wound center to change weekly. Physical Therapy to be mindful no pressure to left heel wound. Patient to use more prevalon boot in bed and wheelchair. Do not transfer when in wheelchair. Anesthetic: (In clinic) Topical Lidocaine 5% applied to wound bed Cellular or Tissue Based Products: Cellular or Tissue Based Product Type: - Run IVR for EpiFix and Epicord=100% covered 05/07/22: Epicord ordered Cellular or Tissue Based Product applied to wound bed, secured with steri-strips, cover with Adaptic or Mepitel. (DO NOT REMOVE). - Epicord #1 05/15/25 Epicord #2 05/22/22 Epicord #3 06/29/2022 Epicord #4 07/07/2022 Epifix #5 07/14/22 Epicord #6 07/20/22 Epicord # 7 07/27/22 ***Run for Grafix***PENDING Epicord #8 08/04/2022 Edema Control - Lymphedema / SCD / Other: Avoid standing for long periods of time. Off-Loading: Prevalon Boot - use while in bed and chair. Additional Orders / Instructions: Follow Nutritious Diet - Monitor/Control Blood Sugar WOUND #2: - Calcaneus Wound Laterality: Left, Distal Cleanser: Soap and Water 2 x Per Week/30  Days Minich, Neera C ([id number] Page 8 of 10 Discharge Instructions: May shower and wash wound with dial antibacterial soap and water prior to dressing change. Cleanser: Wound Cleanser 2 x Per Week/30 Days Discharge Instructions: Cleanse the wound with wound cleanser prior to applying [name] clean dressing using gauze sponges, not tissue or cotton balls. Peri-Wound Care: Maude Leriche (  Moisturizing lotion) 2 x Per Week/30 Days Discharge Instructions: Apply moisturizing lotion as directed Prim Dressing: Santyl Ointment 2 x Per Week/30 Days ary Discharge Instructions: Apply nickel thick amount to wound bed as instructed Prim Dressing: Hydrofera Blue Ready Foam, 2.5 x2.5 in 2 x Per Week/30 Days ary Discharge Instructions: apply over the santyl. Secondary Dressing: ABD Pad, 8x10 2 x Per Week/30 Days Discharge Instructions: Apply over primary dressing as directed. Secondary Dressing: Woven Gauze Sponge, Non-Sterile 4x4 in (Home Health) 2 x Per Week/30 Days Discharge Instructions: Apply over primary dressing as directed. Com pression Wrap: FourPress (4 layer compression wrap) 2 x Per Week/30 Days Discharge Instructions: Apply four layer compression as directed. May also use Miliken CoFlex 2 layer compression system as alternative. 1. Santyl and Hydrofera Blue under 4-layer compressionooleft lower extremity 2. Follow-up in 1 week Electronic Signature(s) Signed: 10/20/2022 3:36:41 PM By: [name] Shan DO Entered By: [name] on 10/20/2022 15:35:57 -------------------------------------------------------------------------------- HxROS Details Patient Name: Date of Service: [name], [name] MY C. 10/20/2022 3:00 PM Medical Record Number: [id number] Patient Account Number: [id number] Date of Birth/Sex: Treating RN: 11/29/1960 (62 y.o. F) Primary Care Provider: Jenean [name] Other Clinician: Referring Provider: Treating Provider/Extender: [name]: 80 Information Obtained From Patient Chart Hematologic/Lymphatic Medical History: Positive for: Anemia Respiratory Medical History: Positive for: Chronic Obstructive Pulmonary Disease (COPD) Cardiovascular Medical History: Positive for: Congestive Heart Failure - Weighs self daily; Hypertension; Myocardial Infarction; Peripheral Venous Disease Gastrointestinal Medical History: Past Medical History Notes: GERD, Peptic Ulcer Disease Endocrine Medical History: Positive for: Type II Diabetes Time with diabetes: 16 years Treated with: Insulin, Oral agents Blood sugar tested every day: Yes Tested : Twice daily Musculoskeletal Medical History: Past Medical History Notes: Skiff, Kinesha C ([id number]) [id number] Page 9 of 10 Broke left leg 3 years ago, 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, foot fused Neurologic Medical History: Positive for: Neuropathy Psychiatric Medical History: Past Medical History Notes: Depression Immunizations Pneumococcal Vaccine: Received Pneumococcal Vaccination: No Implantable Devices None Family and Social History Cancer: Yes - Father,Mother; Diabetes: Yes - Mother,Maternal Grandparents,Paternal Grandparents; Heart Disease: Yes - Siblings,Maternal Grandparents; Hypertension: Yes - Siblings; Tuberculosis: Yes - Maternal Grandparents,Paternal Grandparents; Current every day smoker; Marital Status - Single; Alcohol Use: Never; Drug Use: No History; Caffeine Use: Daily; Financial Concerns: No; Food, Clothing or Shelter Needs: No; Support System Lacking: No; Transportation Concerns: No Electronic Signature(s) Signed: 10/20/2022 3:36:41 PM By: [name] Shan DO Entered By: [name] on 10/20/2022 15:33:26 -------------------------------------------------------------------------------- SuperBill Details Patient Name: Date of Service: [name] Lax C. 10/20/2022 Medical Record Number:  [id number] Patient Account Number: [id number] Date of Birth/Sex: Treating RN: 12-13-1960 (62 y.o. F) Primary Care Provider: Jenean [name] Other Clinician: Referring Provider: Treating Provider/Extender: [name]: 33 Diagnosis Coding ICD-10 Codes Code Description [id number] Non-pressure chronic ulcer of other part of left foot with fat layer exposed E11.621 Type 2 diabetes mellitus with foot ulcer E66.01 Morbid (severe) obesity due to excess calories Facility Procedures : CPT4 Code: [id number] Description: 11042 - DEB SUBQ TISSUE 20 SQ CM/< ICD-10 Diagnosis Description L97.522 Non-pressure chronic ulcer of other part of left foot with fat layer exposed Modifier: Quantity: 1 Physician Procedures : CPT4 Code Description Modifier [id number] - WC PHYS LEVEL 3 - EST PT ICD-10 Diagnosis Description L97.522 Non-pressure chronic ulcer of other part of left foot with fat layer exposed E11.621 Type 2 diabetes mellitus with foot ulcer E66.01 Morbid  (severe) obesity due to excess calories Quantity: 1 :  [id number] 11042 - WC PHYS SUBQ TISS 20 SQ CM Connole, Evangelene C ([id number]) [id number]. ICD-10 Diagnosis Description L97.522 Non-pressure chronic ulcer of other part of left foot with fat layer exposed Quantity: 1 pdf Page 10 of 10 Electronic Signature(s) Signed: 10/22/2022 11:30:24 AM By: [name] Shan DO Signed: 11/23/2022 10:40:58 AM By: [name] Hammock RN Previous Signature: 10/20/2022 3:36:41 PM Version By: [name] Shan DO Entered By: [name] on 10/20/2022 15:37:25

## 2022-10-26 ENCOUNTER — Ambulatory Visit (HOSPITAL_BASED_OUTPATIENT_CLINIC_OR_DEPARTMENT_OTHER): Payer: 59 | Admitting: Internal Medicine

## 2022-10-27 ENCOUNTER — Encounter (HOSPITAL_BASED_OUTPATIENT_CLINIC_OR_DEPARTMENT_OTHER): Payer: 59 | Admitting: Internal Medicine

## 2022-10-27 DIAGNOSIS — E11621 Type 2 diabetes mellitus with foot ulcer: Secondary | ICD-10-CM

## 2022-10-27 DIAGNOSIS — L97522 Non-pressure chronic ulcer of other part of left foot with fat layer exposed: Secondary | ICD-10-CM | POA: Diagnosis not present

## 2022-10-28 NOTE — Progress Notes (Signed)
Enochs, Haylea [name] ([id number]) [id number].pdf Page 1 of 10 Visit Report for 10/27/2022 Chief Complaint Document Details Patient Name: Date of Service: [name] Lax [name]. 10/27/2022 1:30 PM Medical Record Number: [id number] Patient Account Number: [id number] Date of Birth/Sex: Treating RN: 04-06-1961 (62 y.o. F) Primary Care Provider: Jenean Lindau Other Clinician: Referring Provider: Treating Provider/Extender: Elise Benne in [id number] Information Obtained from: Patient Chief Complaint 03/05/2022; left foot wound Electronic Signature(s) Signed: 10/27/2022 4:21:39 PM By: Kalman Shan DO Entered By: Kalman Shan on 10/27/2022 14:13:38 -------------------------------------------------------------------------------- Debridement Details Patient Name: Date of Service: [name] Lax [name]. 10/27/2022 1:30 PM Medical Record Number: [id number] Patient Account Number: [id number] Date of Birth/Sex: Treating RN: 07-04-1961 (62 y.o. [name], [name] Primary Care Provider: Jenean Lindau Other Clinician: Referring Provider: Treating Provider/Extender: Elise Benne in Treatment: 33 Debridement Performed for Assessment: Wound #2 Left,Distal Calcaneus Performed By: Clinician Deon Pilling, RN Debridement Type: Chemical/Enzymatic/Mechanical Agent Used: Santyl Severity of Tissue Pre Debridement: Fat layer exposed Level of Consciousness (Pre-procedure): Awake and Alert Pre-procedure Verification/Time Out No Taken: Bleeding: None Response to Treatment: Procedure was tolerated well Level of Consciousness (Post- Awake and Alert procedure): Post Debridement Measurements of Total Wound Length: (cm) 2.3 Width: (cm) 3.8 Depth: (cm) 0.1 Volume: (cm) 0.686 Character of Wound/Ulcer Post Debridement: Requires Further Debridement Severity of Tissue Post Debridement: Fat layer exposed Post Procedure Diagnosis Same  as Pre-procedure Electronic Signature(s) Signed: 10/27/2022 4:21:39 PM By: Kalman Shan DO Signed: 10/27/2022 6:38:55 PM By: Deon Pilling RN, BSN Entered By: Deon Pilling on 10/27/2022 14:11:03 Burkle, Beth [name] ([id number] Page 2 of 10 -------------------------------------------------------------------------------- HPI Details Patient Name: Date of Service: [name] Lax [name]. 10/27/2022 1:30 PM Medical Record Number: [id number] Patient Account Number: [id number] Date of Birth/Sex: Treating RN: [birth year] (62 y.o. F) Primary Care Provider: Jenean Lindau Other Clinician: Referring Provider: Treating Provider/Extender: Elise Benne in Treatment: 33 History of Present Illness HPI Description: Admission 03/05/2022 [name] is [name] 62 year old female with [name] past medical history of uncontrolled insulin-dependent type 2 diabetes, tobacco user and chronic diastolic heart failure that presents to the clinic for [name] 3-month history of wound to her left heel. She states she had [name] left ankle fusion in September 2022. She states that she always had [name] wound after the surgery and it never healed. She has home health and they have been doing compression wraps along with silver alginate to the wound bed. She currently denies signs of infection. 6/8; this is [name] 62 year old woman with type 2 diabetes. She developed [name] wound on her left Achilles heel just above the tip of the heel in the setting of recurrent ankle surgeries in late 2022. I have seen some of these results from either the cast or the surgical boots that are put on after these operations. She thinks this may be the case. And [name] sense of pressure ulcer. In any case that she has been using Santyl Hydrofera Blue under compression. She is not wearing any footwear at home as she cannot find anything to accommodate the wrap 6/22; patient presents for follow-up. She has home health  that comes out once [name] week to help with dressing changes. She has no issues or complaints today. We have been using Hydrofera Blue under compression therapy. 7/6; patient presents for follow-up. She states that home health did not come out for the past 2 weeks. It is unclear why. We have been using Hydrofera Blue and Santyl  under compression therapy. 7/13; patient presents for follow-up. She did not take the oral antibiotics prescribed at last clinic visit. We have been using Hydrofera Blue with gentamicin/mupirocin ointment under compression therapy. She has no issues or complaints today. She denies signs of infection. 7/20; patient presents for follow-up. We have been using Hydrofera Blue with antibiotic ointment under 3 layer compression. She has home health that change the dressing once. They put collagen on the wound bed. She also reports falling yesterday and hitting her right foot. She has no pain to the area today. 7/27; patient presents for follow-up. We have been using Hydrofera Blue under 3 layer compression. She has home health that changes the dressing. She has no issues or complaints today. 8/3; patient presents for follow-up. We continues to use Hydrofera Blue under 3 layer compression. She has no issues or complaints today. She has been approved for Epicord at 100%. 8/11; patient presents for follow-up. We have been using Hydrofera Blue under 3 layer compression. She has been approved for Epicord and we have this today. She is in agreement with having this applied. 8/18; patient presents for follow-up. Epicord #1 was placed in standard fashion last clinic visit. She has no issues or complaints today. 9/18; Unfortunately patient has missed her last clinic appointments due to falling and breaking her right ankle. We have been following her for her left ankle wound. She has also developed [name] large blister to the left heel over the past week that has ruptured and dried. She currently  resides In [facility]. Wound9/25; patient presents for follow-up. We have been using Hydrofera Blue and Santyl to the original left ankle wound and Xeroform to the dried blistered. We have been wrapping her with compression therapy. She resides in [name] facility and they changed the wrap once this past week. She has no issues or complaints today. She denies signs of infection. 10/3; patient presents for follow-up. We have been using Xeroform to the heel wound and Epicort to the left ankle wound All under compression therapy. She has no issues or complaints today. 10/10; patient presents for follow-up. We have been using Epicort to the left ankle wound and Santyl and Hydrofera Blue to the heel wound. All under compression therapy. she has no issues or complaints today. 10/16; patient presents for follow-up. Epicort was placed in standard fashion to the superior wound and onto the heel and Santyl and Hydrofera Blue. She states that the wrap got wet during her shower and her facility was able to rewrap with Kerlix/Coban. 10/23; patient presents for follow-up. Epicord was placed to the wound beds last clinic visit. We continue Kerlix/Coban. She has no issues or complaints today. 10/31; Patient presents for follow-up. Epicord was placed to the wound beds last clinic visit. The original wound is almost healed. We have done this under Kerlix/Coban. She denies signs of infection. 11/10; patient presents for follow-up. Patient's last epi cord was placed in standard fashion at last clinic visit. The original wound has healed. She still has [name] heel wound. Grafix was approved however too costly for the patient. We will rerun epi cord to see if she can have more applications. She had done very well with this. We have been using Hydrofera Blue to the heel under Kerlix/Coban. 11/21; patient presents for follow-up. We have been using Hydrofera Blue and Santyl to the heel under Kerlix/Coban. She switched to [name] new  healthcare insurance and again The skin substitute is too costly for the patient. She denies  signs of infection. 11/28; patient presents for follow-up. Her facility did not change the wrap last clinic visit due to lack of staffing. The wrap was taken off and Hydrofera Blue dressing has been in place for the past week. She has no issues or complaints today. 12/5; patient presents for follow-up. She states she has had more drainage over the past week. They did not change the wrap at her facility. She states the nurse will be back this week. She is also been doing [name] lot of physical therapy. She has been using the Prevalon boot while in bed. Scheidt, [name] ([id number]) [id number].pdf Page 3 of 10 12/12; patient presents for follow-up. We have been using Hydrofera Blue with antibiotic ointment under 4-layer compression. She is tolerated the wrap well. She states she is using her Prevalon boot while in the wheelchair. She has no issues or complaints today. There is been improvement in wound healing. 12/19; patient presents for follow-up. We have been using Hydrofera Blue and antibiotic ointment to the wound bed under 4-layer compression. Her facility change the wrap once. She states she has been using the Prevalon boot in bed but not in the wheelchair due to issues with transferring. Overall there is still improvement in wound healing. 1/16; patient presents for follow-up. She missed her last clinic appointment. We have been using Hydrofera Blue and antibiotic ointment under 4-layer compression. At the facility this has only been changed twice over the past 3 weeks. They are not using 4-layer compression. She came in with Kerlix/Coban. 1/23; patient presents for follow-up. We have been using Santyl and Hydrofera Blue under 4-layer compression. Patient has no issues or complaints today. Electronic Signature(s) Signed: 10/27/2022 4:21:39 PM By: Kalman Shan DO Entered By: Kalman Shan on 10/27/2022 14:15:55 -------------------------------------------------------------------------------- Physical Exam Details Patient Name: Date of Service: [name] Neighbor MY [name]. 10/27/2022 1:30 PM Medical Record Number: [id number] Patient Account Number: [id number] Date of Birth/Sex: Treating RN: June 15, 1961 (62 y.o. F) Primary Care Provider: Jenean Lindau Other Clinician: Referring Provider: Treating Provider/Extender: Elise Benne in Treatment: 33 Constitutional respirations regular, non-labored and within target range for patient.. Cardiovascular 2+ dorsalis pedis/posterior tibialis pulses. Psychiatric pleasant and cooperative. Notes Left foot: T the heel there is granulation tissue and tightly adhered non viable tissue. No surrounding signs of infection. o Electronic Signature(s) Signed: 10/27/2022 4:21:39 PM By: Kalman Shan DO Entered By: Kalman Shan on 10/27/2022 14:19:12 -------------------------------------------------------------------------------- Physician Orders Details Patient Name: Date of Service: [name], [name]. 10/27/2022 1:30 PM Medical Record Number: [id number] Patient Account Number: [id number] Date of Birth/Sex: Treating RN: Sep 19, 1961 (62 y.o. [name], [name] Primary Care Provider: Jenean Lindau Other Clinician: Referring Provider: Treating Provider/Extender: Elise Benne in Treatment: 88 Verbal / Phone Orders: No Diagnosis Coding ICD-10 Coding Code Description [phone] Non-pressure chronic ulcer of other part of left foot with fat layer exposed E11.621 Type 2 diabetes mellitus with foot ulcer E66.01 Morbid (severe) obesity due to excess calories Harlan, Geraldin [name] ([id number]) [phone].pdf Page 4 of 10 Follow-up Appointments ppointment in 1 week. - Dr. Heber Falun 11/05/2022 315pm Return [name] ppointment in 2 weeks. - Dr. Heber Coleraine Return [name] Other: - Patient to  use more prevalon boot in bed and wheelchair. Do not transfer when in wheelchair. ****facility to change dressing daily.**** Anesthetic (In clinic) Topical Lidocaine 5% applied to wound bed Cellular or Tissue Based Products Cellular or Tissue Based Product Type: - Run IVR for EpiFix and Epicord=100% covered 05/07/22: Epicord ordered daptic  or Mepitel. (DO NOT REMOVE). - Cellular or Tissue Based Product applied to wound bed, secured with steri-strips, cover with [name] Epicord #1 05/15/25 Epicord #2 05/22/22 Epicord #3 06/29/2022 Epicord #4 07/07/2022 Epifix #5 07/14/22 Epicord #6 07/20/22 Epicord # 7 07/27/22 ***Run for Grafix***PENDING Epicord #8 08/04/2022 Edema Control - Lymphedema / SCD / Other Elevate legs to the level of the heart or above for 30 minutes daily and/or when sitting for 3-4 times [name] day throughout the day. Avoid standing for long periods of time. Off-Loading Prevalon Boot - use while in bed and chair. Additional Orders / Instructions Follow Nutritious Diet - Monitor/Control Blood Sugar Wound Treatment Wound #2 - Calcaneus Wound Laterality: Left, Distal Cleanser: Soap and Water 1 x Per Day/30 Days Discharge Instructions: May shower and wash wound with dial antibacterial soap and water prior to dressing change. Cleanser: Wound Cleanser 1 x Per Day/30 Days Discharge Instructions: Cleanse the wound with wound cleanser prior to applying [name] clean dressing using gauze sponges, not tissue or cotton balls. Peri-Wound Care: Sween Lotion (Moisturizing lotion) 1 x Per Day/30 Days Discharge Instructions: Apply moisturizing lotion as directed Prim Dressing: Santyl Ointment 1 x Per Day/30 Days ary Discharge Instructions: Apply nickel thick amount to wound bed as instructed Prim Dressing: Hydrofera Blue Ready Foam, 2.5 x2.5 in 1 x Per Day/30 Days ary Discharge Instructions: apply over the santyl. Secondary Dressing: ABD Pad, 8x10 1 x Per Day/30 Days Discharge Instructions: Apply over  primary dressing as directed. Secondary Dressing: Woven Gauze Sponge, Non-Sterile 4x4 in (Home Health) 1 x Per Day/30 Days Discharge Instructions: Apply over primary dressing as directed. Secured With: [employer], 4.5x3.1 (in/yd) 1 x Per Day/30 Days Discharge Instructions: Secure with Kerlix as directed. Compression Wrap: tubigrip size E 1 x Per Day/30 Days Discharge Instructions: double layer apply in clinic today. Apply in the morning and remove at night. Electronic Signature(s) Signed: 10/27/2022 4:21:39 PM By: Kalman Shan DO Entered By: Kalman Shan on 10/27/2022 14:19:20 Problem List Details -------------------------------------------------------------------------------- Alfonzo Feller ([id number].pdf Page 5 of 10 Patient Name: Date of Service: [name] Lax [name]. 10/27/2022 1:30 PM Medical Record Number: [id number] Patient Account Number: [id number] Date of Birth/Sex: Treating RN: [birth year] (62 y.o. [name], [name] Primary Care Provider: Jenean Lindau Other Clinician: Referring Provider: Treating Provider/Extender: Elise Benne in Treatment: 33 Active Problems ICD-10 Encounter Code Description Active Date MDM Diagnosis [phone] Non-pressure chronic ulcer of other part of left foot with fat layer exposed 03/05/2022 No Yes E11.621 Type 2 diabetes mellitus with foot ulcer 03/05/2022 No Yes E66.01 Morbid (severe) obesity due to excess calories 03/05/2022 No Yes Inactive Problems Resolved Problems Electronic Signature(s) Signed: 10/27/2022 4:21:39 PM By: Kalman Shan DO Entered By: Kalman Shan on 10/27/2022 14:12:38 -------------------------------------------------------------------------------- Progress Note Details Patient Name: Date of Service: [name] Lax [name]. 10/27/2022 1:30 PM Medical Record Number: [id number] Patient Account Number: [id number] Date of Birth/Sex: Treating RN: Feb 25, 1961  (62 y.o. F) Primary Care Provider: Jenean Lindau Other Clinician: Referring Provider: Treating Provider/Extender: Elise Benne in Treatment: 33 Subjective Chief Complaint Information obtained from Patient 03/05/2022; left foot wound History of Present Illness (HPI) Admission 03/05/2022 Ms. Willard Culverson is [name] 62 year old female with [name] past medical history of uncontrolled insulin-dependent type 2 diabetes, tobacco user and chronic diastolic heart failure that presents to the clinic for [name] 52-month history of wound to her left heel. She states she had [name] left ankle fusion in September 2022. She states that  she always had [name] wound after the surgery and it never healed. She has home health and they have been doing compression wraps along with silver alginate to the wound bed. She currently denies signs of infection. 6/8; this is [name] 62 year old woman with type 2 diabetes. She developed [name] wound on her left Achilles heel just above the tip of the heel in the setting of recurrent ankle surgeries in late 2022. I have seen some of these results from either the cast or the surgical boots that are put on after these operations. She thinks this may be the case. And [name] sense of pressure ulcer. In any case that she has been using Santyl Hydrofera Blue under compression. She is not wearing any footwear at home as she cannot find anything to accommodate the wrap 6/22; patient presents for follow-up. She has home health that comes out once [name] week to help with dressing changes. She has no issues or complaints today. We have been using Hydrofera Blue under compression therapy. 7/6; patient presents for follow-up. She states that home health did not come out for the past 2 weeks. It is unclear why. We have been using Hydrofera Blue and Santyl under compression therapy. 7/13; patient presents for follow-up. She did not take the oral antibiotics prescribed at last clinic visit. We have been using  Hydrofera Blue with gentamicin/mupirocin ointment under compression therapy. She has no issues or complaints today. She denies signs of infection. [name], [name] ([id number]) [id number].pdf Page 6 of 10 7/20; patient presents for follow-up. We have been using Hydrofera Blue with antibiotic ointment under 3 layer compression. She has home health that change the dressing once. They put collagen on the wound bed. She also reports falling yesterday and hitting her right foot. She has no pain to the area today. 7/27; patient presents for follow-up. We have been using Hydrofera Blue under 3 layer compression. She has home health that changes the dressing. She has no issues or complaints today. 8/3; patient presents for follow-up. We continues to use Hydrofera Blue under 3 layer compression. She has no issues or complaints today. She has been approved for Epicord at 100%. 8/11; patient presents for follow-up. We have been using Hydrofera Blue under 3 layer compression. She has been approved for Epicord and we have this today. She is in agreement with having this applied. 8/18; patient presents for follow-up. Epicord #1 was placed in standard fashion last clinic visit. She has no issues or complaints today. 9/18; Unfortunately patient has missed her last clinic appointments due to falling and breaking her right ankle. We have been following her for her left ankle wound. She has also developed [name] large blister to the left heel over the past week that has ruptured and dried. She currently resides In [facility]. Wound9/25; patient presents for follow-up. We have been using Hydrofera Blue and Santyl to the original left ankle wound and Xeroform to the dried blistered. We have been wrapping her with compression therapy. She resides in [name] facility and they changed the wrap once this past week. She has no issues or complaints today. She denies signs of infection. 10/3; patient presents  for follow-up. We have been using Xeroform to the heel wound and Epicort to the left ankle wound All under compression therapy. She has no issues or complaints today. 10/10; patient presents for follow-up. We have been using Epicort to the left ankle wound and Santyl and Hydrofera Blue to the heel wound. All under compression therapy.  she has no issues or complaints today. 10/16; patient presents for follow-up. Epicort was placed in standard fashion to the superior wound and onto the heel and Santyl and Hydrofera Blue. She states that the wrap got wet during her shower and her facility was able to rewrap with Kerlix/Coban. 10/23; patient presents for follow-up. Epicord was placed to the wound beds last clinic visit. We continue Kerlix/Coban. She has no issues or complaints today. 10/31; Patient presents for follow-up. Epicord was placed to the wound beds last clinic visit. The original wound is almost healed. We have done this under Kerlix/Coban. She denies signs of infection. 11/10; patient presents for follow-up. Patient's last epi cord was placed in standard fashion at last clinic visit. The original wound has healed. She still has [name] heel wound. Grafix was approved however too costly for the patient. We will rerun epi cord to see if she can have more applications. She had done very well with this. We have been using Hydrofera Blue to the heel under Kerlix/Coban. 11/21; patient presents for follow-up. We have been using Hydrofera Blue and Santyl to the heel under Kerlix/Coban. She switched to [name] new healthcare insurance and again The skin substitute is too costly for the patient. She denies signs of infection. 11/28; patient presents for follow-up. Her facility did not change the wrap last clinic visit due to lack of staffing. The wrap was taken off and Hydrofera Blue dressing has been in place for the past week. She has no issues or complaints today. 12/5; patient presents for follow-up. She states  she has had more drainage over the past week. They did not change the wrap at her facility. She states the nurse will be back this week. She is also been doing [name] lot of physical therapy. She has been using the Prevalon boot while in bed. 12/12; patient presents for follow-up. We have been using Hydrofera Blue with antibiotic ointment under 4-layer compression. She is tolerated the wrap well. She states she is using her Prevalon boot while in the wheelchair. She has no issues or complaints today. There is been improvement in wound healing. 12/19; patient presents for follow-up. We have been using Hydrofera Blue and antibiotic ointment to the wound bed under 4-layer compression. Her facility change the wrap once. She states she has been using the Prevalon boot in bed but not in the wheelchair due to issues with transferring. Overall there is still improvement in wound healing. 1/16; patient presents for follow-up. She missed her last clinic appointment. We have been using Hydrofera Blue and antibiotic ointment under 4-layer compression. At the facility this has only been changed twice over the past 3 weeks. They are not using 4-layer compression. She came in with Kerlix/Coban. 1/23; patient presents for follow-up. We have been using Santyl and Hydrofera Blue under 4-layer compression. Patient has no issues or complaints today. Patient History Information obtained from Patient, Chart. Family History Cancer - Father,Mother, Diabetes - Mother,Maternal Grandparents,Paternal Grandparents, Heart Disease - Siblings,Maternal Grandparents, Hypertension - Siblings, Tuberculosis - Maternal Grandparents,Paternal Grandparents. Social History Current every day smoker, Marital Status - Single, Alcohol Use - Never, Drug Use - No History, Caffeine Use - Daily. Medical History Hematologic/Lymphatic Patient has history of Anemia Respiratory Patient has history of Chronic Obstructive Pulmonary Disease  (COPD) Cardiovascular Patient has history of Congestive Heart Failure - Weighs self daily, Hypertension, Myocardial Infarction, Peripheral Venous Disease Endocrine Patient has history of Type II Diabetes Neurologic Patient has history of Neuropathy Medical [name] Surgical History  Notes nd Gastrointestinal GERD, Peptic Ulcer Disease Musculoskeletal Broke left leg 3 years ago, 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, foot fused Psychiatric Depression [name], [name] ([id number].pdf Page 7 of 10 Objective Constitutional respirations regular, non-labored and within target range for patient.. Vitals Time Taken: 1:34 PM, Height: 66 in, Weight: 339 lbs, BMI: 54.7, Temperature: 98 F, Pulse: 96 bpm, Respiratory Rate: 20 breaths/min, Blood Pressure: 151/80 mmHg. Cardiovascular 2+ dorsalis pedis/posterior tibialis pulses. Psychiatric pleasant and cooperative. General Notes: Left foot: T the heel there is granulation tissue and tightly adhered non viable tissue. No surrounding signs of infection. o Integumentary (Hair, Skin) Wound #2 status is Open. Original cause of wound was Blister. The date acquired was: 06/15/2022. The wound has been in treatment 17 weeks. The wound is located on the Left,Distal Calcaneus. The wound measures 2.3cm length x 3.8cm width x 0.1cm depth; 6.864cm^2 area and 0.686cm^3 volume. There is Fat Layer (Subcutaneous Tissue) exposed. There is no tunneling or undermining noted. There is [name] medium amount of serosanguineous drainage noted. The wound margin is distinct with the outline attached to the wound base. There is large (67-100%) red, pink granulation within the wound bed. There is [name] small (1-33%) amount of necrotic tissue within the wound bed including Adherent Slough. The periwound skin appearance had no abnormalities noted for texture. The periwound skin appearance had no abnormalities noted for color. The periwound skin appearance did not exhibit:  Dry/Scaly, Maceration. Periwound temperature was noted as No Abnormality. Assessment Active Problems ICD-10 Non-pressure chronic ulcer of other part of left foot with fat layer exposed Type 2 diabetes mellitus with foot ulcer Morbid (severe) obesity due to excess calories Patient's wound is stable. At this time I recommended daily dressing changes with Santyl and Hydrofera Blue. We discussed the importance of aggressive offloading to this area to help with wound healing. She has [name] Actor but sounds like she is not wearing it. I highly recommended she use it when she is not ambulating. Follow-up in 1 week. Procedures Wound #2 Pre-procedure diagnosis of Wound #2 is [name] Diabetic Wound/Ulcer of the Lower Extremity located on the Left,Distal Calcaneus .Severity of Tissue Pre Debridement is: Fat layer exposed. There was [name] Chemical/Enzymatic/Mechanical debridement performed by Deon Pilling, RN.Marland Kitchen Agent used was [employer]. There was no bleeding. The procedure was tolerated well. Post Debridement Measurements: 2.3cm length x 3.8cm width x 0.1cm depth; 0.686cm^3 volume. Character of Wound/Ulcer Post Debridement requires further debridement. Severity of Tissue Post Debridement is: Fat layer exposed. Post procedure Diagnosis Wound #2: Same as Pre-Procedure Plan Follow-up Appointments: Return Appointment in 1 week. - Dr. Heber [place] 11/05/2022 315pm Return Appointment in 2 weeks. - Dr. Heber [place] Other: - Patient to use more prevalon boot in bed and wheelchair. Do not transfer when in wheelchair. ****facility to change dressing daily.**** Anesthetic: (In clinic) Topical Lidocaine 5% applied to wound bed Cellular or Tissue Based Products: Cellular or Tissue Based Product Type: - Run IVR for EpiFix and Epicord=100% covered 05/07/22: Epicord ordered Cellular or Tissue Based Product applied to wound bed, secured with steri-strips, cover with Adaptic or Mepitel. (DO NOT REMOVE). - Epicord #1 05/15/25 Epicord #2  05/22/22 Epicord #3 06/29/2022 Epicord #4 07/07/2022 Epifix #5 07/14/22 Epicord #6 07/20/22 Epicord # 7 07/27/22 ***Run for Grafix***PENDING Epicord #8 08/04/2022 Edema Control - Lymphedema / SCD / Other: Elevate legs to the level of the heart or above for 30 minutes daily and/or when sitting for 3-4 times [name] day throughout the day. Krul, Karrah [name] ([id number]) [id number] Page 8  of 10 Avoid standing for long periods of time. Off-Loading: Prevalon Boot - use while in bed and chair. Additional Orders / Instructions: Follow Nutritious Diet - Monitor/Control Blood Sugar WOUND #2: - Calcaneus Wound Laterality: Left, Distal Cleanser: Soap and Water 1 x Per Day/30 Days Discharge Instructions: May shower and wash wound with dial antibacterial soap and water prior to dressing change. Cleanser: Wound Cleanser 1 x Per Day/30 Days Discharge Instructions: Cleanse the wound with wound cleanser prior to applying [name] clean dressing using gauze sponges, not tissue or cotton balls. Peri-Wound Care: Sween Lotion (Moisturizing lotion) 1 x Per Day/30 Days Discharge Instructions: Apply moisturizing lotion as directed Prim Dressing: Santyl Ointment 1 x Per Day/30 Days ary Discharge Instructions: Apply nickel thick amount to wound bed as instructed Prim Dressing: Hydrofera Blue Ready Foam, 2.5 x2.5 in 1 x Per Day/30 Days ary Discharge Instructions: apply over the santyl. Secondary Dressing: ABD Pad, 8x10 1 x Per Day/30 Days Discharge Instructions: Apply over primary dressing as directed. Secondary Dressing: Woven Gauze Sponge, Non-Sterile 4x4 in (Home Health) 1 x Per Day/30 Days Discharge Instructions: Apply over primary dressing as directed. Secured With: [employer], 4.5x3.1 (in/yd) 1 x Per Day/30 Days Discharge Instructions: Secure with Kerlix as directed. Com pression Wrap: tubigrip size E 1 x Per Day/30 Days Discharge Instructions: double layer apply in clinic today. Apply in  the morning and remove at night. 1. Santyl and Hydrofera Blue 2. Aggressive offloadingooPrevalon boot 3. Follow-up in 1 week Electronic Signature(s) Signed: 10/27/2022 4:21:39 PM By: Geralyn Corwin DO Entered By: Geralyn Corwin on 10/27/2022 14:20:12 -------------------------------------------------------------------------------- HxROS Details Patient Name: Date of Service: [name], [name]. 10/27/2022 1:30 PM Medical Record Number: [id number] Patient Account Number: [id number] Date of Birth/Sex: Treating RN: [birth year] (62 y.o. F) Primary Care Provider: [name] Other Clinician: Referring Provider: Treating Provider/Extender: [name] in Treatment: 33 Information Obtained From Patient Chart Hematologic/Lymphatic Medical History: Positive for: Anemia Respiratory Medical History: Positive for: Chronic Obstructive Pulmonary Disease (COPD) Cardiovascular Medical History: Positive for: Congestive Heart Failure - Weighs self daily; Hypertension; Myocardial Infarction; Peripheral Venous Disease Gastrointestinal Medical History: Past Medical History Notes: GERD, Peptic Ulcer Disease Endocrine Medical History: Positive for: Type II Diabetes [name], [name] ([id number]) [id number] Page 9 of 10 Time with diabetes: 16 years Treated with: Insulin, Oral agents Blood sugar tested every day: Yes Tested : Twice daily Musculoskeletal Medical History: Past Medical History Notes: Broke left leg 3 years ago, 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, foot fused Neurologic Medical History: Positive for: Neuropathy Psychiatric Medical History: Past Medical History Notes: Depression Immunizations Pneumococcal Vaccine: Received Pneumococcal Vaccination: No Implantable Devices None Family and Social History Cancer: Yes - Father,Mother; Diabetes: Yes - Mother,Maternal Grandparents,Paternal Grandparents; Heart Disease: Yes -  Siblings,Maternal Grandparents; Hypertension: Yes - Siblings; Tuberculosis: Yes - Maternal Grandparents,Paternal Grandparents; Current every day smoker; Marital Status - Single; Alcohol Use: Never; Drug Use: No History; Caffeine Use: Daily; Financial Concerns: No; Food, Clothing or Shelter Needs: No; Support System Lacking: No; Transportation Concerns: No Electronic Signature(s) Signed: 10/27/2022 4:21:39 PM By: Geralyn Corwin DO Entered By: Geralyn Corwin on 10/27/2022 14:16:01 -------------------------------------------------------------------------------- SuperBill Details Patient Name: Date of Service: [name] Bushy [name]. 10/27/2022 Medical Record Number: [id number] Patient Account Number: [id number] Date of Birth/Sex: Treating RN: 1961/10/04 (61 y.o. [name] Primary Care Provider: [name] Other Clinician: Referring Provider: Treating Provider/Extender: [name] in Treatment: 33 Diagnosis Coding ICD-10 Codes Code Description [phone] Non-pressure chronic ulcer of other  part of left foot with fat layer exposed E11.621 Type 2 diabetes mellitus with foot ulcer E66.01 Morbid (severe) obesity due to excess calories Facility Procedures : CPT4 Code: [id number] Description: 78242 - DEBRIDE W/O ANES NON SELECT Modifier: Quantity: 1 Physician Procedures : CPT4 Code Description Modifier [id number] 31540 - WC PHYS LEVEL 3 - EST PT [name], Jamese [name] ([id number]) [id number]. ICD-10 Diagnosis Description L97.522 Non-pressure chronic ulcer of other part of left foot with fat layer exposed  E11.621 Type 2 diabetes mellitus with foot ulcer E66.01 Morbid (severe) obesity due to excess calories Quantity: 1 pdf Page 10 of 10 Electronic Signature(s) Signed: 10/27/2022 4:21:39 PM By: Kalman Shan DO Entered By: Kalman Shan on 10/27/2022 14:20:25

## 2022-10-28 NOTE — Progress Notes (Signed)
[name], [name] ([id number]) [id number] Page 1 of 8 Visit Report for 10/27/2022 Arrival Information Details Patient Name: Date of Service: [name] Lax C. 10/27/2022 1:30 PM Medical Record Number: [id number] Patient Account Number: [id number] Date of Birth/Sex: Treating RN: 07/08/1961 (62 y.o. F) Primary Care Selmer Adduci: Jenean Lindau Other Clinician: Referring Donelle Baba: Treating Amma Crear/Extender: Elise Benne in Treatment: 33 Visit Information History Since Last Visit Added or deleted any medications: No Patient Arrived: Wheel Chair Any new allergies or adverse reactions: No Arrival Time: 13:33 Had a fall or experienced change in No Accompanied By: self activities of daily living that may affect Transfer Assistance: None risk of falls: Patient Identification Verified: Yes Signs or symptoms of abuse/neglect since last visito No Secondary Verification Process Completed: Yes Hospitalized since last visit: No Patient Requires Transmission-Based Precautions: No Implantable device outside of the clinic excluding No Patient Has Alerts: No cellular tissue based products placed in the center since last visit: Has Compression in Place as Prescribed: Yes Pain Present Now: No Electronic Signature(s) Signed: 10/27/2022 4:56:53 PM By: Erenest Blank Entered By: Erenest Blank on 10/27/2022 13:34:17 -------------------------------------------------------------------------------- Encounter Discharge Information Details Patient Name: Date of Service: [name] Lax C. 10/27/2022 1:30 PM Medical Record Number: [id number] Patient Account Number: [id number] Date of Birth/Sex: Treating RN: 11/16/1960 (62 y.o. [name] Primary Care Aravind Chrismer: Jenean Lindau Other Clinician: Referring Garrell Flagg: Treating Treon Kehl/Extender: Elise Benne in Treatment: 33 Encounter Discharge Information Items Post  Procedure Vitals Discharge Condition: Stable Temperature (F): 98 Ambulatory Status: Wheelchair Pulse (bpm): 96 Discharge Destination: Home Respiratory Rate (breaths/min): 20 Transportation: Private Auto Blood Pressure (mmHg): 151/80 Accompanied By: self Schedule Follow-up Appointment: Yes Clinical Summary of Care: Electronic Signature(s) Signed: 10/27/2022 6:38:55 PM By: Deon Pilling RN, BSN Entered By: Deon Pilling on 10/27/2022 14:11:53 Counterman, Leonor C ([id number]) [id number] Page 2 of 8 -------------------------------------------------------------------------------- Lower Extremity Assessment Details Patient Name: Date of Service: [name] Lax C. 10/27/2022 1:30 PM Medical Record Number: [id number] Patient Account Number: [id number] Date of Birth/Sex: Treating RN: March 27, 1961 (62 y.o. F) Primary Care Kate Sweetman: Jenean Lindau Other Clinician: Referring Blaize Epple: Treating Danie Diehl/Extender: Elise Benne in Treatment: 33 Edema Assessment Assessed: Shirlyn Goltz: No] Patrice Paradise: No] Edema: [Left: Ye] [Right: s] Calf Left: Right: Point of Measurement: 28 cm From Medial Instep 53.5 cm Ankle Left: Right: Point of Measurement: 3 cm From Medial Instep 30 cm Knee To Floor Left: Right: From Medial Instep 37 cm Electronic Signature(s) Signed: 10/27/2022 6:38:55 PM By: Deon Pilling RN, BSN Entered By: Deon Pilling on 10/27/2022 14:12:11 -------------------------------------------------------------------------------- Multi Wound Chart Details Patient Name: Date of Service: [name] Lax C. 10/27/2022 1:30 PM Medical Record Number: [id number] Patient Account Number: [id number] Date of Birth/Sex: Treating RN: 01-22-1961 (62 y.o. F) Primary Care [name]: Jenean Lindau Other Clinician: Referring Aleicia Kenagy: Treating [name]/Extender: Elise Benne in Treatment: 33 Vital Signs Height(in):  66 Pulse(bpm): 96 Weight(lbs): 339 Blood Pressure(mmHg): 151/80 Body Mass Index(BMI): 54.7 Temperature(F): 98 Respiratory Rate(breaths/min): 20 [2:Photos:] [N/A:N/A] Left, Distal Calcaneus N/A N/A Wound Location: [name], Amarrah C ([id number]) [id number] Page 3 of 8 Blister N/A N/A Wounding Event: Diabetic Wound/Ulcer of the Lower N/A N/A Primary Etiology: Extremity Anemia, Chronic Obstructive N/A N/A Comorbid History: Pulmonary Disease (COPD), Congestive Heart Failure, Hypertension, Myocardial Infarction, Peripheral Venous Disease, Type II Diabetes, Neuropathy 06/15/2022 N/A N/A Date Acquired: 67 N/A N/A Weeks of Treatment: Open N/A N/A Wound Status: No N/A N/A Wound Recurrence: 2.3x3.8x0.1 N/A N/A Measurements L x W  x D (cm) 6.864 N/A N/A A (cm) : rea 0.686 N/A N/A Volume (cm) : 51.40% N/A N/A % Reduction in A rea: 51.50% N/A N/A % Reduction in Volume: Grade 2 N/A N/A Classification: Medium N/A N/A Exudate A mount: Serosanguineous N/A N/A Exudate Type: red, brown N/A N/A Exudate Color: Distinct, outline attached N/A N/A Wound Margin: Large (67-100%) N/A N/A Granulation A mount: Red, Pink N/A N/A Granulation Quality: Small (1-33%) N/A N/A Necrotic A mount: Fat Layer (Subcutaneous Tissue): Yes N/A N/A Exposed Structures: Fascia: No Tendon: No Muscle: No Joint: No Bone: No Medium (34-66%) N/A N/A Epithelialization: Chemical/Enzymatic/Mechanical N/A N/A Debridement: N/A N/A N/A Instrument: None N/A N/A Bleeding: Debridement Treatment Response: Procedure was tolerated well N/A N/A Post Debridement Measurements L x 2.3x3.8x0.1 N/A N/A W x D (cm) 0.686 N/A N/A Post Debridement Volume: (cm) No Abnormalities Noted N/A N/A Periwound Skin Texture: Maceration: No N/A N/A Periwound Skin Moisture: Dry/Scaly: No No Abnormalities Noted N/A N/A Periwound Skin Color: No Abnormality N/A N/A Temperature: Debridement N/A  N/A Procedures Performed: Treatment Notes Wound #2 (Calcaneus) Wound Laterality: Left, Distal Cleanser Soap and Water Discharge Instruction: May shower and wash wound with dial antibacterial soap and water prior to dressing change. Wound Cleanser Discharge Instruction: Cleanse the wound with wound cleanser prior to applying a clean dressing using gauze sponges, not tissue or cotton balls. Peri-Wound Care Sween Lotion (Moisturizing lotion) Discharge Instruction: Apply moisturizing lotion as directed Topical Primary Dressing Santyl Ointment Discharge Instruction: Apply nickel thick amount to wound bed as instructed Hydrofera Blue Ready Foam, 2.5 x2.5 in Discharge Instruction: apply over the santyl. Secondary Dressing ABD Pad, 8x10 Discharge Instruction: Apply over primary dressing as directed. Woven Gauze Sponge, Non-Sterile 4x4 in Discharge Instruction: Apply over primary dressing as directed. Secured With [employer], 4.5x3.1 (in/yd) Discharge Instruction: Secure with Kerlix as directed. Compression Wrap Mccuistion, Maddisyn C ([id number]) [id number] Page 4 of 8 tubigrip size E Discharge Instruction: double layer apply in clinic today. Apply in the morning and remove at night. Compression Stockings Add-Ons Electronic Signature(s) Signed: 10/27/2022 4:21:39 PM By: Kalman Shan DO Entered By: Kalman Shan on 10/27/2022 14:12:48 -------------------------------------------------------------------------------- [place] Details Patient Name: Date of Service: [name] Lax C. 10/27/2022 1:30 PM Medical Record Number: [id number] Patient Account Number: [id number] Date of Birth/Sex: Treating RN: June 25, 1961 (62 y.o. [name], [name] Primary Care Yulitza Shorts: Jenean Lindau Other Clinician: Referring Trea Carnegie: Treating Sreenidhi Ganson/Extender: Elise Benne in Treatment: 58 Active Inactive Abuse / Safety /  Falls / Self Care Management Nursing Diagnoses: History of Falls Goals: Patient will not experience any injury related to falls Date Initiated: 03/05/2022 Target Resolution Date: 12/04/2022 Goal Status: Active Interventions: Assess fall risk on admission and as needed Assess: immobility, friction, shearing, incontinence upon admission and as needed Assess impairment of mobility on admission and as needed per policy Notes: 2/45/80: Fall prevention ongoing, recent fall. Wound/Skin Impairment Nursing Diagnoses: Impaired tissue integrity Goals: Patient/caregiver will verbalize understanding of skin care regimen Date Initiated: 03/05/2022 Target Resolution Date: 12/04/2022 Goal Status: Active Ulcer/skin breakdown will have a volume reduction of 30% by week 4 Date Initiated: 03/05/2022 Date Inactivated: 04/30/2022 Target Resolution Date: 05/02/2022 Goal Status: Met Ulcer/skin breakdown will have a volume reduction of 50% by week 8 Date Initiated: 04/30/2022 Date Inactivated: 07/07/2022 Target Resolution Date: 07/04/2022 Unmet Reason: patient sent three Goal Status: Unmet weeks inpatient and rehab before returning to wound center. Interventions: Assess patient/caregiver ability to obtain necessary supplies Assess patient/caregiver ability to perform  ulcer/skin care regimen upon admission and as needed Assess ulceration(s) every visit Provide education on smoking Provide education on ulcer and skin care Lorincz, Jada C ([id number]) [id number] Page 5 of 8 Treatment Activities: Topical wound management initiated : 03/05/2022 Notes: 04/30/22: Wound care regimen continues. Electronic Signature(s) Signed: 10/27/2022 6:38:55 PM By: Shawn Stall RN, BSN Entered By: Shawn Stall on 10/27/2022 13:50:14 -------------------------------------------------------------------------------- Pain Assessment Details Patient Name: Date of Service: Sharol Harness MY C. 10/27/2022 1:30  PM Medical Record Number: [id number] Patient Account Number: [id number] Date of Birth/Sex: Treating RN: 1961-04-21 (61 y.o. F) Primary Care Maritza Hosterman: [name] Other Clinician: Referring Cherelle Midkiff: Treating Murdis Flitton/Extender: [name] in Treatment: 33 Active Problems Location of Pain Severity and Description of Pain Patient Has Paino No Site Locations Pain Management and Medication Current Pain Management: Electronic Signature(s) Signed: 10/27/2022 4:56:53 PM By: Thayer Dallas Entered By: Thayer Dallas on 10/27/2022 13:37:43 -------------------------------------------------------------------------------- Patient/Caregiver Education Details Patient Name: Date of Service: [name] 1/23/2024andnbsp1:30 PM Medical Record Number: [id number] Patient Account Number: [id number] Date of Birth/Gender: Treating RN: 07/01/1961 (62 y.o. [name], [name] Primary Care Physician: [name] Other Clinician: Referring Physician: Treating Physician/Extender: [name] in Treatment: [address], [place] C ([id number]) [id number] Page 6 of 8 Education Assessment Education Provided To: Patient Education Topics Provided Wound/Skin Impairment: Handouts: Caring for Your Ulcer Methods: Explain/Verbal Responses: Reinforcements needed Electronic Signature(s) Signed: 10/27/2022 6:38:55 PM By: Shawn Stall RN, BSN Entered By: Shawn Stall on 10/27/2022 13:53:01 -------------------------------------------------------------------------------- Wound Assessment Details Patient Name: Date of Service: [name] 10/27/2022 1:30 PM Medical Record Number: [id number] Patient Account Number: [id number] Date of Birth/Sex: Treating RN: March 21, 1961 (61 y.o. F) Primary Care Shayma Pfefferle: [name] Other Clinician: Referring Neil Brickell: Treating Emily Forse/Extender: [name] in Treatment: 33 Wound Status Wound Number: 2 Primary Diabetic Wound/Ulcer of the Lower Extremity Etiology: Wound Location: Left, Distal Calcaneus Wound Open Wounding Event: Blister Status: Date Acquired: 06/15/2022 Comorbid Anemia, Chronic Obstructive Pulmonary Disease (COPD), Weeks Of Treatment: 17 History: Congestive Heart Failure, Hypertension, Myocardial Infarction, Clustered Wound: No Peripheral Venous Disease, Type II Diabetes, Neuropathy Photos Wound Measurements Length: (cm) 2.3 Width: (cm) 3.8 Depth: (cm) 0.1 Area: (cm) 6.864 Volume: (cm) 0.686 % Reduction in Area: 51.4% % Reduction in Volume: 51.5% Epithelialization: Medium (34-66%) Tunneling: No Undermining: No Wound Description Classification: Grade 2 Wound Margin: Distinct, outline attached Exudate Amount: Medium Exudate Type: Serosanguineous Exudate Color: red, brown Foul Odor After Cleansing: No Slough/Fibrino Yes Wound Bed Granulation Amount: Large (67-100%) Exposed Structure Stuckey, Kieu C ([id number]) [id number] Page 7 of 8 Granulation Quality: Red, Pink Fascia Exposed: No Necrotic Amount: Small (1-33%) Fat Layer (Subcutaneous Tissue) Exposed: Yes Necrotic Quality: Adherent Slough Tendon Exposed: No Muscle Exposed: No Joint Exposed: No Bone Exposed: No Periwound Skin Texture Texture Color No Abnormalities Noted: Yes No Abnormalities Noted: Yes Moisture Temperature / Pain No Abnormalities Noted: No Temperature: No Abnormality Dry / Scaly: No Maceration: No Treatment Notes Wound #2 (Calcaneus) Wound Laterality: Left, Distal Cleanser Soap and Water Discharge Instruction: May shower and wash wound with dial antibacterial soap and water prior to dressing change. Wound Cleanser Discharge Instruction: Cleanse the wound with wound cleanser prior to applying a clean dressing using gauze sponges, not tissue or cotton balls. Peri-Wound Care Sween Lotion  (Moisturizing lotion) Discharge Instruction: Apply moisturizing lotion as directed Topical Primary Dressing Santyl Ointment Discharge Instruction: Apply nickel thick amount to wound bed as instructed Hydrofera Blue Ready Foam, 2.5 x2.5  in Discharge Instruction: apply over the santyl. Secondary Dressing ABD Pad, 8x10 Discharge Instruction: Apply over primary dressing as directed. Woven Gauze Sponge, Non-Sterile 4x4 in Discharge Instruction: Apply over primary dressing as directed. Secured With [employer], 4.5x3.1 (in/yd) Discharge Instruction: Secure with Kerlix as directed. Compression Wrap tubigrip size E Discharge Instruction: double layer apply in clinic today. Apply in the morning and remove at night. Compression Stockings Add-Ons Electronic Signature(s) Signed: 10/27/2022 4:56:53 PM By: Erenest Blank Entered By: Erenest Blank on 10/27/2022 13:48:02 -------------------------------------------------------------------------------- Vitals Details Patient Name: Date of Service: [name] Lax C. 10/27/2022 1:30 PM Medical Record Number: [id number] Patient Account Number: [id number] Date of Birth/Sex: Treating RN: May 31, 1961 (62 y.o. F) Primary Care Aayana Reinertsen: Jenean Lindau Other Clinician: Referring Marri Mcneff: Treating Lylee Corrow/Extender: Waldron Session Bailey, Adalei C ([id number]) [id number] Page 8 of 8 Weeks in Treatment: 33 Vital Signs Time Taken: 13:34 Temperature (F): 98 Height (in): 66 Pulse (bpm): 96 Weight (lbs): 339 Respiratory Rate (breaths/min): 20 Body Mass Index (BMI): 54.7 Blood Pressure (mmHg): 151/80 Reference Range: 80 - 120 mg / dl Electronic Signature(s) Signed: 10/27/2022 4:56:53 PM By: Erenest Blank Entered By: Erenest Blank on 10/27/2022 13:37:32

## 2022-10-30 IMAGING — CR DG ANKLE COMPLETE 3+V*L*
2 series · 2 of 2 positions shown · non-contrast
Comparison: Preoperative imaging.

CLINICAL DATA: Left ankle fusion.

EXAM:
LEFT ANKLE COMPLETE - 3+ VIEW

[AP]
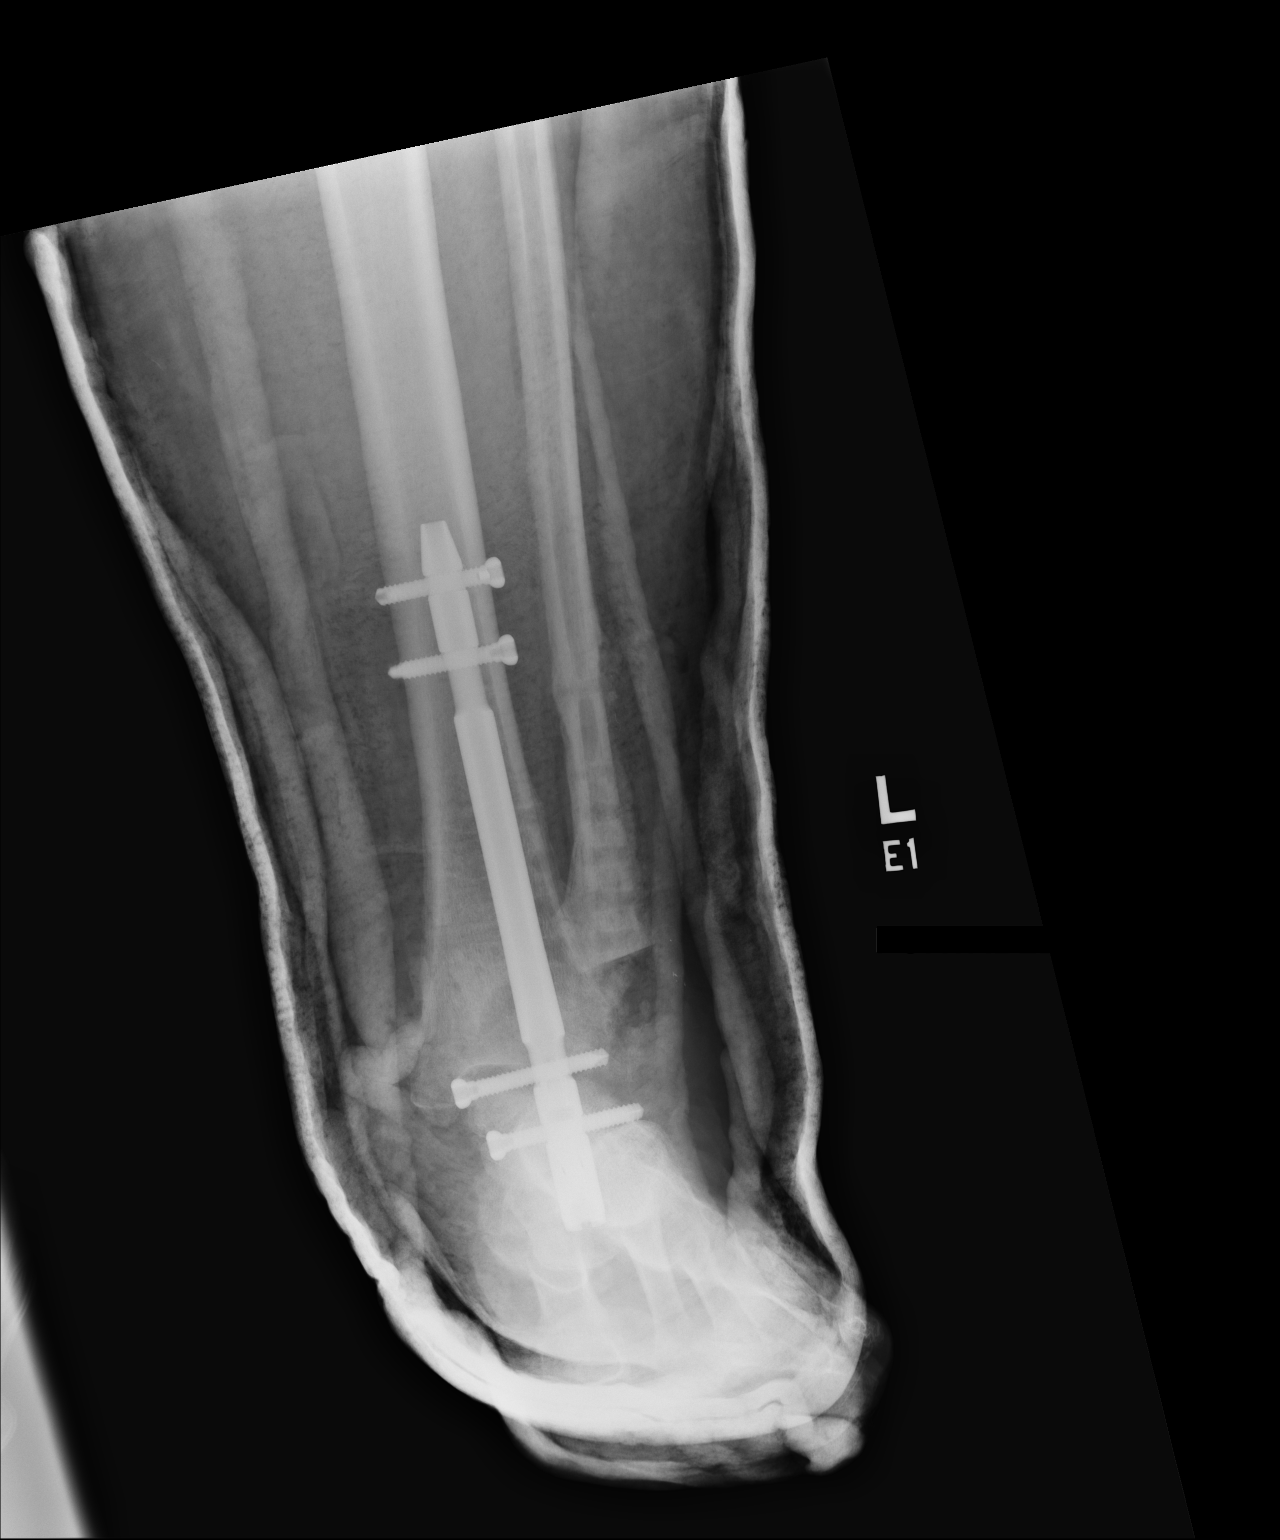

[lateral]
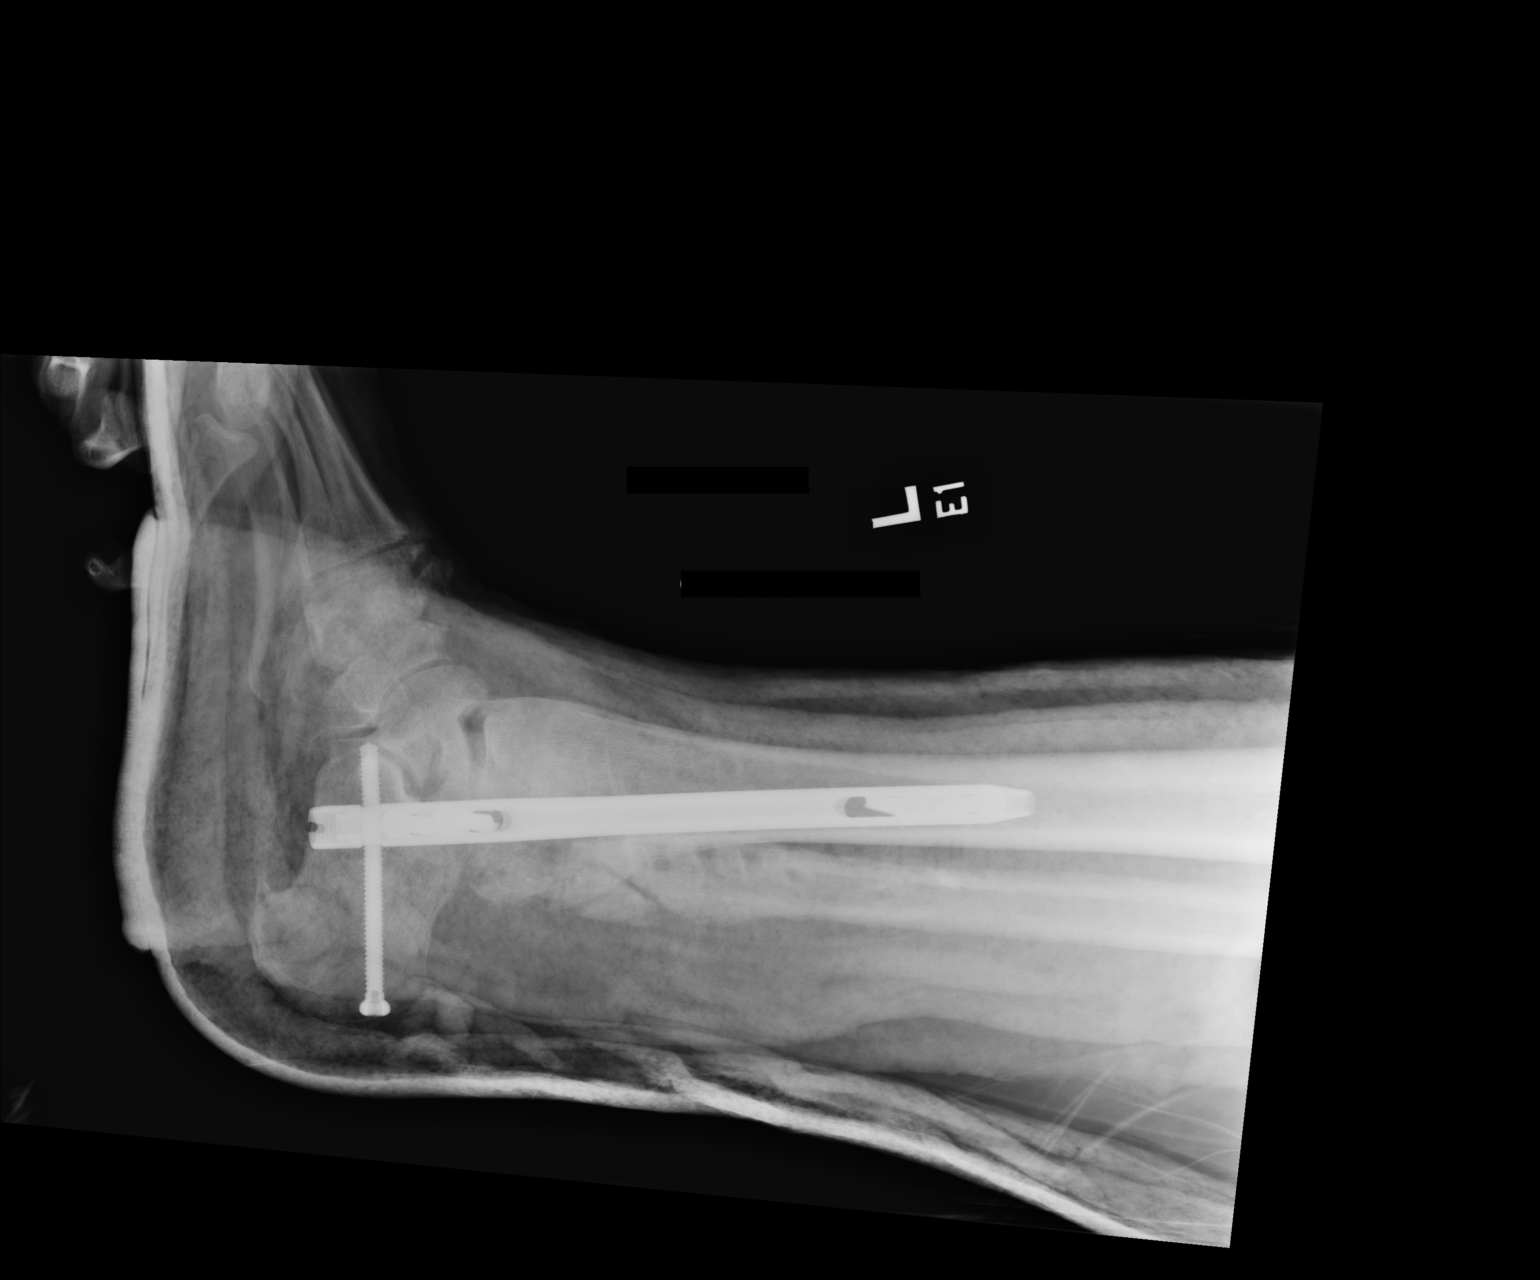

[2 of 2 positions shown; findings below may reference images not displayed]

FINDINGS: Removal of previous distal fibular hardware. Intramedullary nail
extends from the tibia through the posterior talus and calcaneus
with distal and proximal locking screws. Resection of the distal
fibula. Overlying cast material limits osseous and soft tissue fine
detail.
IMPRESSION: Post ankle fusion.

## 2022-10-30 IMAGING — RF DG ANKLE COMPLETE 3+V*L*
1 series · 10 of 10 positions shown · non-contrast
Comparison: Preoperative imaging.

CLINICAL DATA: Removal of left ankle hardware.  Left ankle fusion.

EXAM:
LEFT ANKLE COMPLETE - 3+ VIEW

[Series 1: run · 10 of 10 slices shown]
[im 1/10]
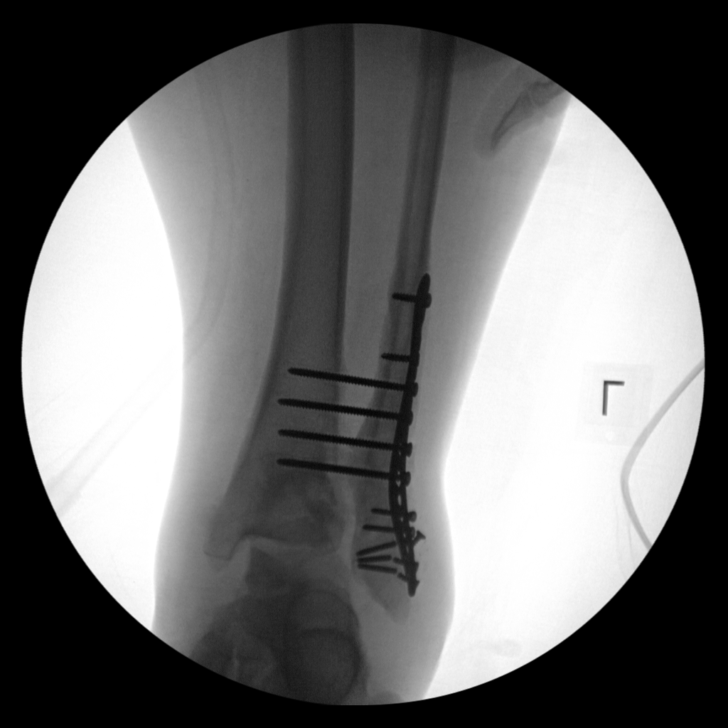
[im 2/10]
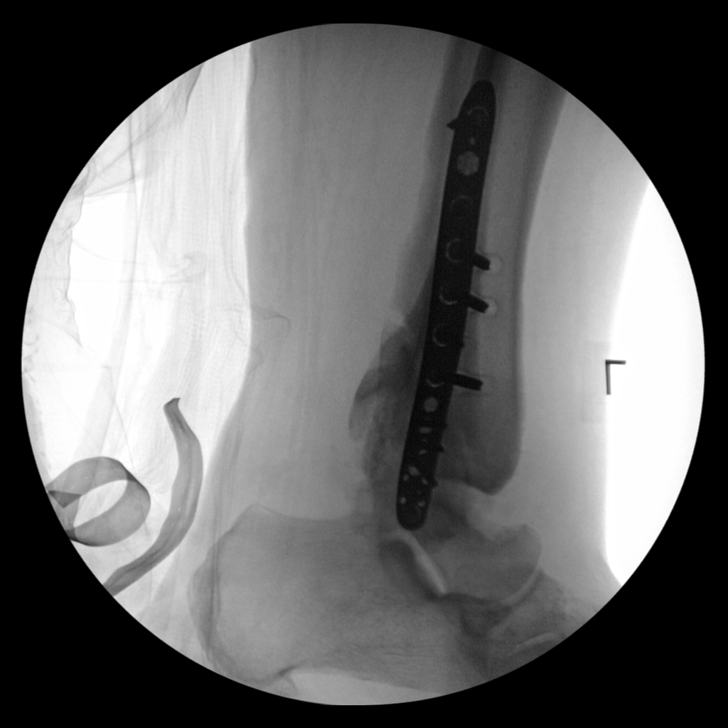
[im 3/10]
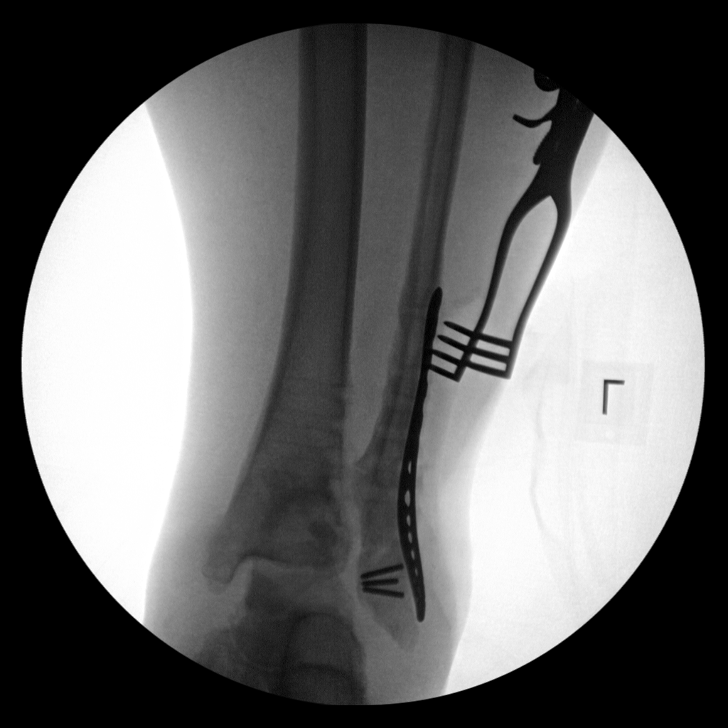
[im 4/10]
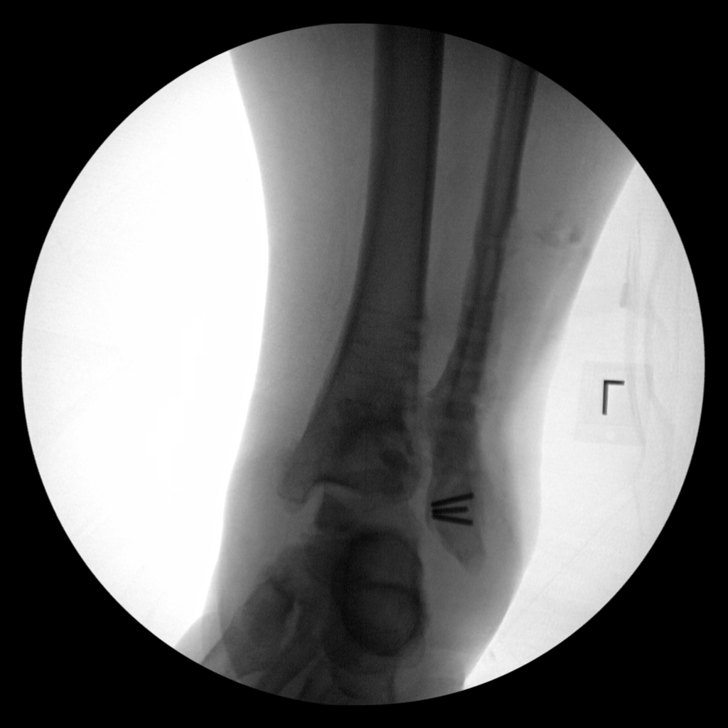
[im 5/10]
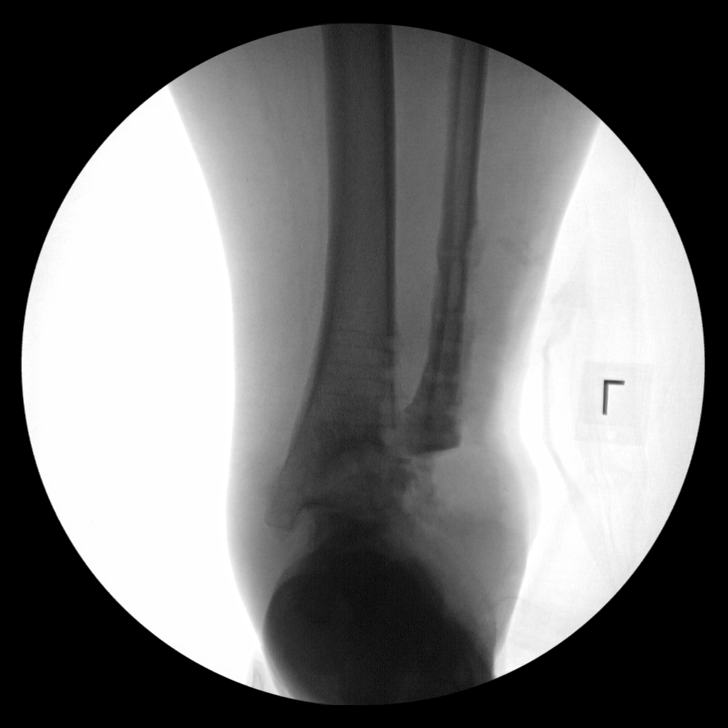
[im 6/10]
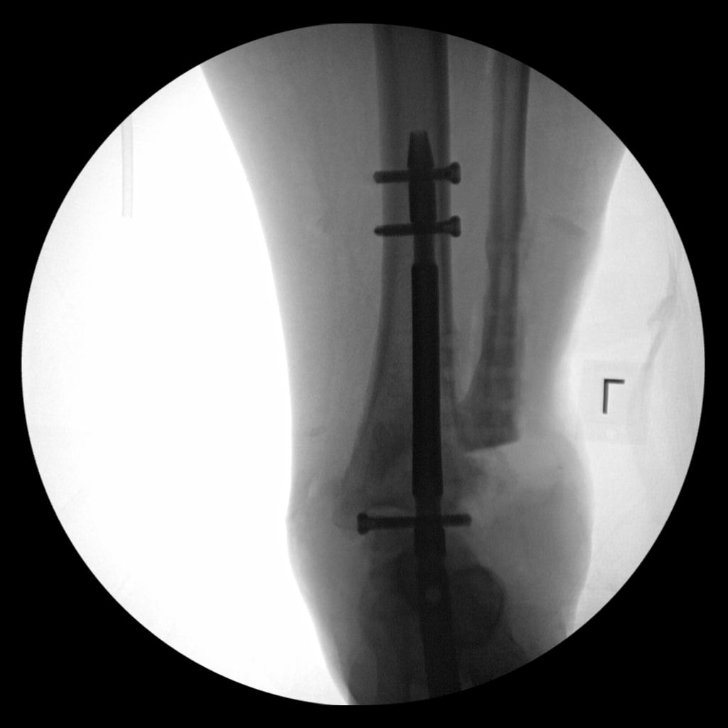
[im 7/10]
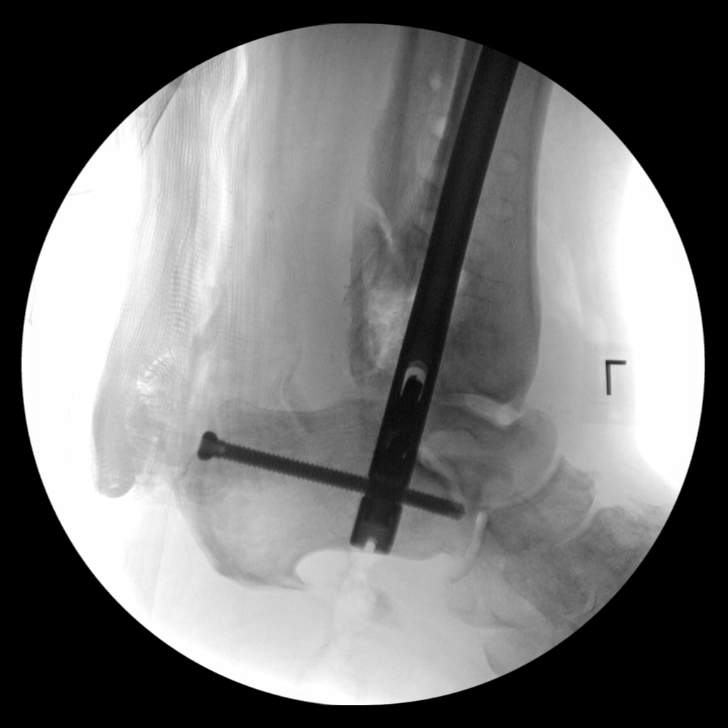
[im 8/10]
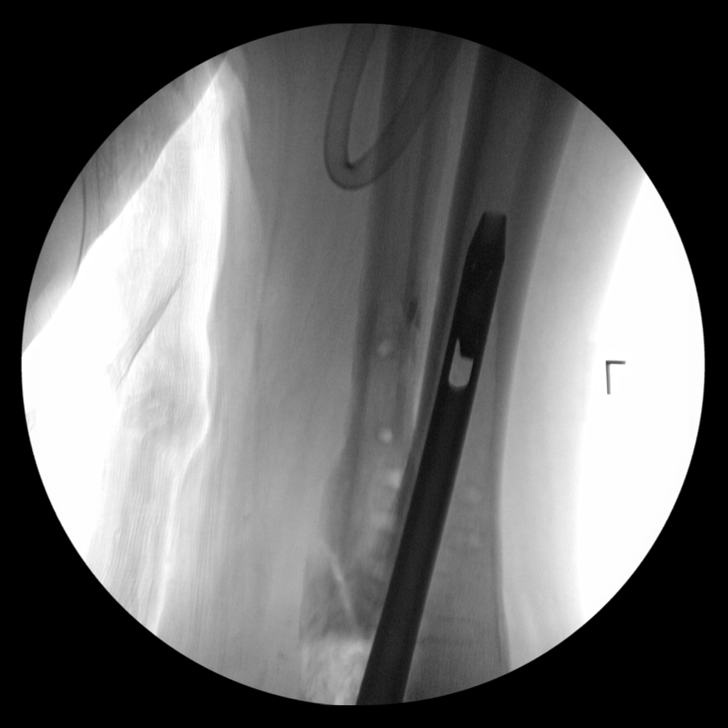
[im 9/10]
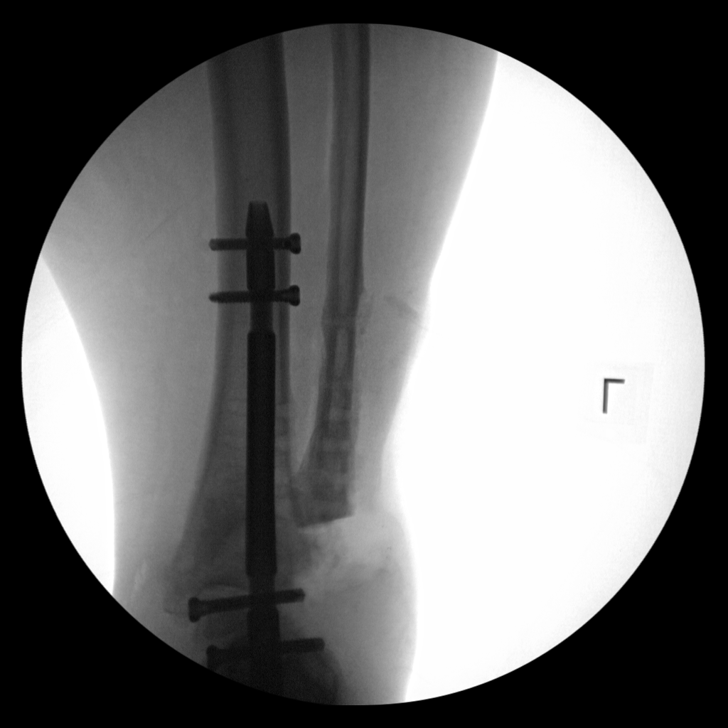
[im 10/10]
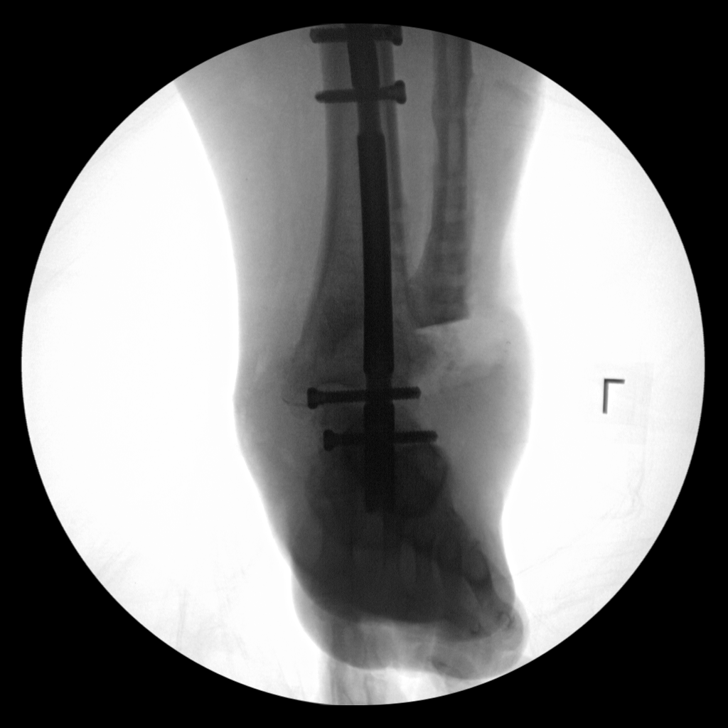

[10 of 10 positions shown; findings below may reference images not displayed]

FINDINGS: Ten fluoroscopic spot views of the left ankle obtained in the
operating room. Removal of lateral plate and multi screw fixation of
the distal fibula. Interval distal fibular resection with fusion
hardware, nail traverses the distal tibia, talus and calcaneus with
locking screws. Fluoroscopy time 2 minutes 34 seconds. Dose
mGy.
IMPRESSION: Intraoperative fluoroscopy during left ankle surgery.

## 2022-11-05 ENCOUNTER — Encounter (HOSPITAL_BASED_OUTPATIENT_CLINIC_OR_DEPARTMENT_OTHER): Payer: 59 | Attending: Internal Medicine | Admitting: Internal Medicine

## 2022-11-05 DIAGNOSIS — L97522 Non-pressure chronic ulcer of other part of left foot with fat layer exposed: Secondary | ICD-10-CM | POA: Insufficient documentation

## 2022-11-05 DIAGNOSIS — Z794 Long term (current) use of insulin: Secondary | ICD-10-CM | POA: Insufficient documentation

## 2022-11-05 DIAGNOSIS — I110 Hypertensive heart disease with heart failure: Secondary | ICD-10-CM | POA: Insufficient documentation

## 2022-11-05 DIAGNOSIS — E11621 Type 2 diabetes mellitus with foot ulcer: Secondary | ICD-10-CM | POA: Diagnosis not present

## 2022-11-05 DIAGNOSIS — Z6841 Body Mass Index (BMI) 40.0 and over, adult: Secondary | ICD-10-CM | POA: Diagnosis not present

## 2022-11-05 DIAGNOSIS — I5032 Chronic diastolic (congestive) heart failure: Secondary | ICD-10-CM | POA: Insufficient documentation

## 2022-11-06 ENCOUNTER — Ambulatory Visit: Payer: 59 | Admitting: Psychiatry

## 2022-11-06 NOTE — Progress Notes (Signed)
[name], [name] ([id number]) [id number].pdf Page 1 of 8 Visit Report for 11/05/2022 Arrival Information Details Patient Name: Date of Service: [name] 11/05/2022 3:15 PM Medical Record Number: [id number] Patient Account Number: [id number] Date of Birth/Sex: Treating RN: 06-06-1961 (62 y.o. [name], [name] Primary Care Zayleigh Stroh: Jenean Lindau Other Clinician: Referring Trinitie Mcgirr: Treating Lafawn Lenoir/Extender: Elise Benne in Treatment: 64 Visit Information History Since Last Visit Added or deleted any medications: No Patient Arrived: Wheel Chair Any new allergies or adverse reactions: No Arrival Time: 15:18 Had a fall or experienced change in No Accompanied By: self activities of daily living that may affect Transfer Assistance: Manual risk of falls: Patient Identification Verified: Yes Signs or symptoms of abuse/neglect since last visito No Secondary Verification Process Completed: Yes Hospitalized since last visit: No Patient Requires Transmission-Based Precautions: No Implantable device outside of the clinic excluding No Patient Has Alerts: No cellular tissue based products placed in the center since last visit: Has Dressing in Place as Prescribed: Yes Pain Present Now: Yes Electronic Signature(s) Signed: 11/06/2022 8:21:34 AM By: Rhae Hammock RN Entered By: Rhae Hammock on 11/05/2022 15:19:38 -------------------------------------------------------------------------------- Encounter Discharge Information Details Patient Name: Date of Service: [name] Lax [name]. 11/05/2022 3:15 PM Medical Record Number: [id number] Patient Account Number: [id number] Date of Birth/Sex: Treating RN: 09-Dec-1960 (62 y.o. [name], [name] Primary Care Reino Lybbert: Jenean Lindau Other Clinician: Referring Sayra Frisby: Treating Armondo Cech/Extender: Elise Benne in Treatment: 90 Encounter Discharge  Information Items Discharge Condition: Stable Ambulatory Status: Wheelchair Discharge Destination: Home Transportation: Private Auto Accompanied By: self Schedule Follow-up Appointment: Yes Clinical Summary of Care: Patient Declined Electronic Signature(s) Signed: 11/06/2022 8:21:34 AM By: Rhae Hammock RN Entered By: Rhae Hammock on 11/05/2022 15:29:19 [name], [name] ([id number]) [id number] Page 2 of 8 -------------------------------------------------------------------------------- Lower Extremity Assessment Details Patient Name: Date of Service: [name] Lax [name]. 11/05/2022 3:15 PM Medical Record Number: [id number] Patient Account Number: [id number] Date of Birth/Sex: Treating RN: 1960-12-13 (62 y.o. [name], [name] Primary Care Lamoine Fredricksen: Jenean Lindau Other Clinician: Referring Nashae Maudlin: Treating Adrie Picking/Extender: Elise Benne in Treatment: 35 Edema Assessment Assessed: Shirlyn Goltz: Yes] Patrice Paradise: No] Edema: [Left: Ye] [Right: s] Calf Left: Right: Point of Measurement: 28 cm From Medial Instep 53.5 cm Ankle Left: Right: Point of Measurement: 3 cm From Medial Instep 30 cm Vascular Assessment Pulses: Dorsalis Pedis Palpable: [Left:Yes] Posterior Tibial Palpable: [Left:Yes] Electronic Signature(s) Signed: 11/06/2022 8:21:34 AM By: Rhae Hammock RN Entered By: Rhae Hammock on 11/05/2022 15:20:04 -------------------------------------------------------------------------------- Multi Wound Chart Details Patient Name: Date of Service: [name] Lax [name]. 11/05/2022 3:15 PM Medical Record Number: [id number] Patient Account Number: [id number] Date of Birth/Sex: Treating RN: August 18, 1961 (62 y.o. 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: Extremity Anemia, Chronic Obstructive N/A N/A Comorbid History: Pulmonary Disease (COPD), Congestive Heart Failure, Hypertension, Myocardial Infarction, Peripheral Venous Disease, Type II Diabetes, Neuropathy 06/15/2022 N/A N/A Date Acquired: 18 N/A N/A Weeks of Treatment: Open N/A N/A Wound Status: No N/A N/A Wound Recurrence: 3x3x0.1 N/A N/A Measurements L x W x D (cm) 7.069 N/A N/A A (cm) : rea 0.707 N/A N/A Volume (cm) : 50.00% N/A N/A %  Reduction in A rea: 50.00% N/A N/A % Reduction in Volume: Grade 2 N/A N/A Classification: Medium N/A N/A Exudate A mount: Serosanguineous N/A N/A Exudate Type: red, brown N/A N/A Exudate Color: Yes N/A N/A Foul Odor A Cleansing: fter No N/A N/A Odor A nticipated Due to Product Use: Distinct, outline attached N/A N/A Wound Margin: Large (67-100%) N/A N/A Granulation A mount: Red, Pink N/A N/A Granulation Quality: Small (1-33%) N/A N/A Necrotic A mount: Fat Layer (Subcutaneous Tissue): Yes N/A N/A Exposed Structures: Fascia: No Tendon: No Muscle: No Joint: No Bone: No Medium (34-66%) N/A N/A Epithelialization: Debridement - Selective/Open Wound N/A N/A Debridement: Pre-procedure Verification/Time Out 15:45 N/A N/A Taken: Lidocaine N/A N/A Pain Control: Slough N/A N/A Tissue Debrided: Non-Viable Tissue N/A N/A Level: 9 N/A N/A Debridement A (sq cm): rea Curette N/A N/A Instrument: Minimum N/A N/A Bleeding: Pressure N/A N/A Hemostasis A chieved: 0 N/A N/A Procedural Pain: 0 N/A N/A Post Procedural Pain: Procedure was tolerated well N/A N/A Debridement Treatment Response: 3x3x0.1 N/A N/A Post  Debridement Measurements L x W x D (cm) 0.707 N/A N/A Post Debridement Volume: (cm) No Abnormalities Noted N/A N/A Periwound Skin Texture: Maceration: No N/A N/A Periwound Skin Moisture: Dry/Scaly: No No Abnormalities Noted N/A N/A Periwound Skin Color: No Abnormality N/A N/A Temperature: Debridement N/A N/A Procedures Performed: Treatment Notes Wound #2 (Calcaneus) Wound Laterality: Left, Distal Cleanser Soap and Water Discharge Instruction: May shower and wash wound with dial antibacterial soap and water prior to dressing change. Wound Cleanser Discharge Instruction: Cleanse the wound with wound cleanser prior to applying a clean dressing using gauze sponges, not tissue or cotton balls. Peri-Wound Care Sween Lotion (Moisturizing lotion) Discharge Instruction: Apply moisturizing lotion as directed Topical Primary Dressing Santyl Ointment Discharge Instruction: Apply nickel thick amount to wound bed as instructed [name], [name] ([id number]) [id number] Page 4 of 8 Hydrofera Blue Ready Foam, 2.5 x2.5 in Discharge Instruction: apply over the santyl. Secondary Dressing ABD Pad, 8x10 Discharge Instruction: Apply over primary dressing as directed. Woven Gauze Sponge, Non-Sterile 4x4 in Discharge Instruction: Apply over primary dressing as directed. Secured With [employer], 4.5x3.1 (in/yd) Discharge Instruction: Secure with Kerlix as directed. Compression Wrap tubigrip size E Discharge Instruction: double layer apply in clinic today. Apply in the morning and remove at night. Compression Stockings Add-Ons Electronic Signature(s) Signed: 11/05/2022 4:03:41 PM By: Geralyn Corwin DO Entered By: Geralyn Corwin on 11/05/2022 15:53:53 -------------------------------------------------------------------------------- Multi-Disciplinary Care Plan Details Patient Name: Date of Service: [name] Bushy [name]. 11/05/2022 3:15 PM Medical Record Number:  [id number] Patient Account Number: [id number] Date of Birth/Sex: Treating RN: [birth year] (61 y.o. [name], [name] Primary Care Kazuki Ingle: [name] Other Clinician: Referring Sema Stangler: Treating Rocquel Askren/Extender: [name] in Treatment: 35 Active Inactive Abuse / Safety / Falls / Self Care Management Nursing Diagnoses: History of Falls Goals: Patient will not experience any injury related to falls Date Initiated: 03/05/2022 Target Resolution Date: 12/04/2022 Goal Status: Active Interventions: Assess fall risk on admission and as needed Assess: immobility, friction, shearing, incontinence upon admission and as needed Assess impairment of mobility on admission and as needed per policy Notes: 04/30/22: Fall prevention ongoing, recent fall. Wound/Skin Impairment Nursing Diagnoses: Impaired tissue integrity Goals: Patient/caregiver will verbalize understanding of skin care regimen Date Initiated: 03/05/2022 Target Resolution Date: 12/04/2022 Goal Status: Active Ulcer/skin breakdown will have a volume reduction of 30% by week 4 [name], [name] ([id number]) [id number] Page 5 of 8 Date Initiated: 03/05/2022 Date Inactivated: 04/30/2022 Target Resolution  Date: 05/02/2022 Goal Status: Met Ulcer/skin breakdown will have a volume reduction of 50% by week 8 Date Initiated: 04/30/2022 Date Inactivated: 07/07/2022 Target Resolution Date: 07/04/2022 Unmet Reason: patient sent three Goal Status: Unmet weeks inpatient and rehab before returning to wound center. Interventions: Assess patient/caregiver ability to obtain necessary supplies Assess patient/caregiver ability to perform ulcer/skin care regimen upon admission and as needed Assess ulceration(s) every visit Provide education on smoking Provide education on ulcer and skin care Treatment Activities: Topical wound management initiated : 03/05/2022 Notes: 04/30/22: Wound care  regimen continues. Electronic Signature(s) Signed: 11/06/2022 8:21:34 AM By: Rhae Hammock RN Entered By: Rhae Hammock on 11/05/2022 15:28:20 -------------------------------------------------------------------------------- Pain Assessment Details Patient Name: Date of Service: [name] Lax [name]. 11/05/2022 3:15 PM Medical Record Number: [id number] Patient Account Number: [id number] Date of Birth/Sex: Treating RN: 25-Feb-1961 (61 y.o. [name], [name] Primary Care Natasha Paulson: Jenean Lindau Other Clinician: Referring Chaselynn Kepple: Treating Ossiel Marchio/Extender: Elise Benne in Treatment: 35 Active Problems Location of Pain Severity and Description of Pain Patient Has Paino Yes Site Locations Pain Location: Generalized Pain, Pain in Ulcers With Dressing Change: Yes Duration of the Pain. Constant / Intermittento Intermittent Rate the pain. Current Pain Level: 5 Worst Pain Level: 10 Least Pain Level: 0 Tolerable Pain Level: 5 Character of Pain Describe the Pain: Aching Pain Management and Medication Current Pain Management: Medication: No Cold Application: No Rest: No Massage: No Activity: No T.E.N.S.: No Heat Application: No Leg drop or elevation: No Is the Current Pain Management Adequate: Adequate [name], [name] ([id number]) [id number] Page 6 of 8 How does your wound impact your activities of daily livingo Sleep: No Bathing: No Appetite: No Relationship With Others: No Bladder Continence: No Emotions: No Bowel Continence: No Work: No Toileting: No Drive: No Dressing: No Hobbies: No Electronic Signature(s) Signed: 11/06/2022 8:21:34 AM By: Rhae Hammock RN Entered By: Rhae Hammock on 11/05/2022 15:20:28 -------------------------------------------------------------------------------- Patient/Caregiver Education Details Patient Name: Date of Service: [name] 2/1/2024andnbsp3:15  PM Medical Record Number: [id number] Patient Account Number: [id number] Date of Birth/Gender: Treating RN: 17-Apr-1961 (62 y.o. [name], [name] Primary Care Physician: Jenean Lindau Other Clinician: Referring Physician: Treating Physician/Extender: Elise Benne in Treatment: 34 Education Assessment Education Provided To: Patient Education Topics Provided Wound/Skin Impairment: Methods: Explain/Verbal Responses: Reinforcements needed, State content correctly [employer]) Signed: 11/06/2022 8:21:34 AM By: Rhae Hammock RN Entered By: Rhae Hammock on 11/05/2022 15:28:32 -------------------------------------------------------------------------------- Wound Assessment Details Patient Name: Date of Service: [name] Lax [name]. 11/05/2022 3:15 PM Medical Record Number: [id number] Patient Account Number: [id number] Date of Birth/Sex: Treating RN: 21-Nov-1960 (62 y.o. [name], [name] Primary Care Meliss Fleek: Jenean Lindau Other Clinician: Referring Amiere Cawley: Treating Shawnique Mariotti/Extender: Elise Benne in Treatment: 35 Wound Status Wound Number: 2 Primary Diabetic Wound/Ulcer of the Lower Extremity Etiology: Wound Location: Left, Distal Calcaneus Wound Open Wounding Event: Blister Status: Date Acquired: 06/15/2022 Comorbid Anemia, Chronic Obstructive Pulmonary Disease (COPD), Weeks Of Treatment: 18 History: Congestive Heart Failure, Hypertension, Myocardial Infarction, Clustered Wound: No Peripheral Venous Disease, Type II Diabetes, Neuropathy Photos [name], [name] ([id number]) [id number] Page 7 of 8 Wound Measurements Length: (cm) 3 Width: (cm) 3 Depth: (cm) 0.1 Area: (cm) 7.069 Volume: (cm) 0.707 % Reduction in Area: 50% % Reduction in Volume: 50% Epithelialization: Medium (34-66%) Tunneling: No Undermining: No Wound Description Classification: Grade 2 Wound  Margin: Distinct, outline attached Exudate Amount: Medium Exudate Type: Serosanguineous Exudate Color: red, brown Foul Odor After Cleansing: Yes Due to  Product Use: No Slough/Fibrino Yes Wound Bed Granulation Amount: Large (67-100%) Exposed Structure Granulation Quality: Red, Pink Fascia Exposed: No Necrotic Amount: Small (1-33%) Fat Layer (Subcutaneous Tissue) Exposed: Yes Necrotic Quality: Adherent Slough Tendon Exposed: No Muscle Exposed: No Joint Exposed: No Bone Exposed: No Periwound Skin Texture Texture Color No Abnormalities Noted: Yes No Abnormalities Noted: Yes Moisture Temperature / Pain No Abnormalities Noted: No Temperature: No Abnormality Dry / Scaly: No Maceration: No Treatment Notes Wound #2 (Calcaneus) Wound Laterality: Left, Distal Cleanser Soap and Water Discharge Instruction: May shower and wash wound with dial antibacterial soap and water prior to dressing change. Wound Cleanser Discharge Instruction: Cleanse the wound with wound cleanser prior to applying a clean dressing using gauze sponges, not tissue or cotton balls. Peri-Wound Care Sween Lotion (Moisturizing lotion) Discharge Instruction: Apply moisturizing lotion as directed Topical Primary Dressing Santyl Ointment Discharge Instruction: Apply nickel thick amount to wound bed as instructed Hydrofera Blue Ready Foam, 2.5 x2.5 in Discharge Instruction: apply over the santyl. Secondary Dressing ABD Pad, 8x10 Discharge Instruction: Apply over primary dressing as directed. Woven Gauze Sponge, Non-Sterile 4x4 in Discharge Instruction: Apply over primary dressing as directed. [name], [name] ([id number]) [id number] Page 8 of 8 Secured With [employer], 4.5x3.1 (in/yd) Discharge Instruction: Secure with Kerlix as directed. Compression Wrap tubigrip size E Discharge Instruction: double layer apply in clinic today. Apply in the morning and remove at  night. Compression Stockings Add-Ons Electronic Signature(s) Signed: 11/06/2022 8:21:34 AM By: Rhae Hammock RN Entered By: Rhae Hammock on 11/05/2022 15:23:02 -------------------------------------------------------------------------------- Vitals Details Patient Name: Date of Service: [name] Lax [name]. 11/05/2022 3:15 PM Medical Record Number: [id number] Patient Account Number: [id number] Date of Birth/Sex: Treating RN: January 21, 1961 (62 y.o. [name], [name] Primary Care Keili Hasten: Jenean Lindau Other Clinician: Referring Megha Agnes: Treating Param Capri/Extender: Elise Benne in Treatment: 35 Vital Signs Time Taken: 15:19 Temperature (F): 98.7 Height (in): 66 Pulse (bpm): 74 Weight (lbs): 339 Respiratory Rate (breaths/min): 17 Body Mass Index (BMI): 54.7 Blood Pressure (mmHg): 134/74 Reference Range: 80 - 120 mg / dl Electronic Signature(s) Signed: 11/06/2022 8:21:34 AM By: Rhae Hammock RN Entered By: Rhae Hammock on 11/05/2022 15:19:54

## 2022-11-06 NOTE — Progress Notes (Signed)
Sklar, Madigan [name] ([id number]) [id number] Page 1 of 10 Visit Report for 11/05/2022 Chief Complaint Document Details Patient Name: Date of Service: [name], [name]. 11/05/2022 3:15 PM Medical Record Number: [id number] Patient Account Number: [id number] Date of Birth/Sex: Treating RN: 1961/06/04 ([age] y.o. F) Primary Care Provider: Junious Dresserampbell, Stephen Other Clinician: Referring Provider: Treating Provider/Extender: Andree MoroHoffman, Satya Buttram Campbell, Stephen Weeks in Treatment: 35 Information Obtained from: Patient Chief Complaint 03/05/2022; left foot wound Electronic Signature(s) Signed: 11/05/2022 4:03:41 PM By: Geralyn CorwinHoffman, Dama Hedgepeth DO Entered By: Geralyn CorwinHoffman, Mamie Hundertmark on 11/05/2022 15:54:00 -------------------------------------------------------------------------------- Debridement Details Patient Name: Date of Service: [name], [name]. 11/05/2022 3:15 PM Medical Record Number: [id number] Patient Account Number: [id number] Date of Birth/Sex: Treating RN: 1961/06/04 (61 y.o. Ardis RowanF) Breedlove, [name] Primary Care Provider: Junious Dresserampbell, Stephen Other Clinician: Referring Provider: Treating Provider/Extender: Andree MoroHoffman, Trayven Lumadue Campbell, Stephen Weeks in Treatment: 35 Debridement Performed for Assessment: Wound #2 Left,Distal Calcaneus Performed By: Physician Geralyn CorwinHoffman, Aristotelis Vilardi, DO Debridement Type: Debridement Severity of Tissue Pre Debridement: Fat layer exposed Level of Consciousness (Pre-procedure): Awake and Alert Pre-procedure Verification/Time Out Yes - 15:45 Taken: Start Time: 15:45 Pain Control: Lidocaine T Area Debrided (L x W): otal 3 (cm) x 3 (cm) = 9 (cm) Tissue and other material debrided: Viable, Non-Viable, Slough, Slough Level: Non-Viable Tissue Debridement Description: Selective/Open Wound Instrument: Curette Bleeding: Minimum Hemostasis Achieved: Pressure End Time: 15:45 Procedural Pain: 0 Post Procedural Pain: 0 Response to Treatment: Procedure was tolerated  well Level of Consciousness (Post- Awake and Alert procedure): Post Debridement Measurements of Total Wound Length: (cm) 3 Width: (cm) 3 Depth: (cm) 0.1 Volume: (cm) 0.707 Character of Wound/Ulcer Post Debridement: Improved Severity of Tissue Post Debridement: Fat layer exposed [name], [name] ([id number]) [id number] Page 2 of 10 Post Procedure Diagnosis Same as Pre-procedure Electronic Signature(s) Signed: 11/05/2022 4:03:41 PM By: Geralyn CorwinHoffman, [name] Ikeda DO Signed: 11/06/2022 8:21:34 AM By: Fonnie MuBreedlove, [name] RN Entered By: Fonnie MuBreedlove, [name] on 11/05/2022 15:50:50 -------------------------------------------------------------------------------- HPI Details Patient Name: Date of Service: [name], [name]. 11/05/2022 3:15 PM Medical Record Number: [id number] Patient Account Number: [id number] Date of Birth/Sex: Treating RN: 1961/06/04 (61 y.o. F) Primary Care Provider: Junious Dresserampbell, Stephen Other Clinician: Referring Provider: Treating Provider/Extender: Andree MoroHoffman, Praise Dolecki Campbell, Stephen Weeks in Treatment: 35 History of Present Illness HPI Description: Admission 03/05/2022 [name] is [name] 62 year old female with [name] past medical history of uncontrolled insulin-dependent type 2 diabetes, tobacco user and chronic diastolic heart failure that presents to the clinic for [name] 1359-month history of wound to her left heel. She states she had [name] left ankle fusion in September 2022. She states that she always had [name] wound after the surgery and it never healed. She has home health and they have been doing compression wraps along with silver alginate to the wound bed. She currently denies signs of infection. 6/8; this is [name] 62 year old woman with type 2 diabetes. She developed [name] wound on her left Achilles heel just above the tip of the heel in the setting of recurrent ankle surgeries in late 2022. I have seen some of these results from either the cast or the surgical boots that are put on  after these operations. She thinks this may be the case. And [name] sense of pressure ulcer. In any case that she has been using Santyl Hydrofera Blue under compression. She is not wearing any footwear at home as she cannot find anything to accommodate the wrap 6/22; patient presents for follow-up. She has home health that comes out once [name] week to help with dressing  changes. She has no issues or complaints today. We have been using Hydrofera Blue under compression therapy. 7/6; patient presents for follow-up. She states that home health did not come out for the past 2 weeks. It is unclear why. We have been using Hydrofera Blue and Santyl under compression therapy. 7/13; patient presents for follow-up. She did not take the oral antibiotics prescribed at last clinic visit. We have been using Hydrofera Blue with gentamicin/mupirocin ointment under compression therapy. She has no issues or complaints today. She denies signs of infection. 7/20; patient presents for follow-up. We have been using Hydrofera Blue with antibiotic ointment under 3 layer compression. She has home health that change the dressing once. They put collagen on the wound bed. She also reports falling yesterday and hitting her right foot. She has no pain to the area today. 7/27; patient presents for follow-up. We have been using Hydrofera Blue under 3 layer compression. She has home health that changes the dressing. She has no issues or complaints today. 8/3; patient presents for follow-up. We continues to use Hydrofera Blue under 3 layer compression. She has no issues or complaints today. She has been approved for Epicord at 100%. 8/11; patient presents for follow-up. We have been using Hydrofera Blue under 3 layer compression. She has been approved for Epicord and we have this today. She is in agreement with having this applied. 8/18; patient presents for follow-up. Epicord #1 was placed in standard fashion last clinic visit. She has no  issues or complaints today. 9/18; Unfortunately patient has missed her last clinic appointments due to falling and breaking her right ankle. We have been following her for her left ankle wound. She has also developed [name] large blister to the left heel over the past week that has ruptured and dried. She currently resides In [facility]. Wound9/25; patient presents for follow-up. We have been using Hydrofera Blue and Santyl to the original left ankle wound and Xeroform to the dried blistered. We have been wrapping her with compression therapy. She resides in [name] facility and they changed the wrap once this past week. She has no issues or complaints today. She denies signs of infection. 10/3; patient presents for follow-up. We have been using Xeroform to the heel wound and Epicort to the left ankle wound All under compression therapy. She has no issues or complaints today. 10/10; patient presents for follow-up. We have been using Epicort to the left ankle wound and Santyl and Hydrofera Blue to the heel wound. All under compression therapy. she has no issues or complaints today. 10/16; patient presents for follow-up. Epicort was placed in standard fashion to the superior wound and onto the heel and Santyl and Hydrofera Blue. She states that the wrap got wet during her shower and her facility was able to rewrap with Kerlix/Coban. 10/23; patient presents for follow-up. Epicord was placed to the wound beds last clinic visit. We continue Kerlix/Coban. She has no issues or complaints today. 10/31; Patient presents for follow-up. Epicord was placed to the wound beds last clinic visit. The original wound is almost healed. We have done this under Kerlix/Coban. She denies signs of infection. [name], [name] ([id number]) [id number].pdf Page 3 of 10 11/10; patient presents for follow-up. Patient's last epi cord was placed in standard fashion at last clinic visit. The original wound has healed.  She still has [name] heel wound. Grafix was approved however too costly for the patient. We will rerun epi cord to see if she can have more  applications. She had done very well with this. We have been using Hydrofera Blue to the heel under Kerlix/Coban. 11/21; patient presents for follow-up. We have been using Hydrofera Blue and Santyl to the heel under Kerlix/Coban. She switched to [name] new healthcare insurance and again The skin substitute is too costly for the patient. She denies signs of infection. 11/28; patient presents for follow-up. Her facility did not change the wrap last clinic visit due to lack of staffing. The wrap was taken off and Hydrofera Blue dressing has been in place for the past week. She has no issues or complaints today. 12/5; patient presents for follow-up. She states she has had more drainage over the past week. They did not change the wrap at her facility. She states the nurse will be back this week. She is also been doing [name] lot of physical therapy. She has been using the Prevalon boot while in bed. 12/12; patient presents for follow-up. We have been using Hydrofera Blue with antibiotic ointment under 4-layer compression. She is tolerated the wrap well. She states she is using her Prevalon boot while in the wheelchair. She has no issues or complaints today. There is been improvement in wound healing. 12/19; patient presents for follow-up. We have been using Hydrofera Blue and antibiotic ointment to the wound bed under 4-layer compression. Her facility change the wrap once. She states she has been using the Prevalon boot in bed but not in the wheelchair due to issues with transferring. Overall there is still improvement in wound healing. 1/16; patient presents for follow-up. She missed her last clinic appointment. We have been using Hydrofera Blue and antibiotic ointment under 4-layer compression. At the facility this has only been changed twice over the past 3 weeks. They are not  using 4-layer compression. She came in with Kerlix/Coban. 1/23; patient presents for follow-up. We have been using Santyl and Hydrofera Blue under 4-layer compression. Patient has no issues or complaints today. 2/1; patient presents for follow-up. Patient's been using Santyl and Hydrofera Blue to the wound bed. She has developed more slough and now [name] mild odor. She states she is wearing the Prevalon boot at night. It is unclear if she is offloading this area during the day. Electronic Signature(s) Signed: 11/05/2022 4:03:41 PM By: Geralyn Corwin DO Entered By: Geralyn Corwin on 11/05/2022 15:54:49 -------------------------------------------------------------------------------- Physical Exam Details Patient Name: Date of Service: [name] Bushy [name]. 11/05/2022 3:15 PM Medical Record Number: [id number] Patient Account Number: [id number] Date of Birth/Sex: Treating RN: 1961/05/21 (62 y.o. F) Primary Care Provider: [name] Other Clinician: Referring Provider: Treating Provider/Extender: [name] in Treatment: 35 Constitutional respirations regular, non-labored and within target range for patient.. Cardiovascular 2+ dorsalis pedis/posterior tibialis pulses. Psychiatric pleasant and cooperative. Notes Left foot: T the heel there is an open wound with granulation tissue and nonviable tissue to the center. No surrounding signs of infection. Mild odor. o Electronic Signature(s) Signed: 11/05/2022 4:03:41 PM By: Geralyn Corwin DO Entered By: Geralyn Corwin on 11/05/2022 15:55:41 -------------------------------------------------------------------------------- Physician Orders Details Patient Name: Date of Service: [name] Bushy [name]. 11/05/2022 3:15 PM Medical Record Number: [id number] Patient Account Number: [id number] Date of Birth/Sex: Treating RN: January 26, 1961 (61 y.o. [name], [name] Primary Care Provider: [name] Other  Clinician: Lou Miner, Malayia [name] ([id number]) [phone].pdf Page 4 of 10 Referring Provider: Treating Provider/Extender: [name] in Treatment: [phone] Verbal / Phone Orders: No Diagnosis Coding Follow-up Appointments ppointment in 1 week. - Dr. Mikey Bussing Thursday  11/12/22 @ 3:15 Rm # 9 w/ Lauran Return [name] ppointment in 2 weeks. - Dr. Mikey BussingHoffman and Maryruth BunLauran Rm # 9 Return [name] Other: - Patient to use more prevalon boot in bed and wheelchair. Do not transfer when in wheelchair. ****facility to change dressing daily.**** Anesthetic (In clinic) Topical Lidocaine 5% applied to wound bed Cellular or Tissue Based Products Cellular or Tissue Based Product Type: - Run IVR for EpiFix and Epicord=100% covered 05/07/22: Epicord ordered daptic or Mepitel. (DO NOT REMOVE). - Cellular or Tissue Based Product applied to wound bed, secured with steri-strips, cover with [name] Epicord #1 05/15/25 Epicord #2 05/22/22 Epicord #3 06/29/2022 Epicord #4 07/07/2022 Epifix #5 07/14/22 Epicord #6 07/20/22 Epicord # 7 07/27/22 ***Run for Grafix***PENDING Epicord #8 08/04/2022 Edema Control - Lymphedema / SCD / Other Elevate legs to the level of the heart or above for 30 minutes daily and/or when sitting for 3-4 times [name] day throughout the day. Avoid standing for long periods of time. Off-Loading Prevalon Boot - use while in bed and chair. Additional Orders / Instructions Follow Nutritious Diet - Monitor/Control Blood Sugar Wound Treatment Wound #2 - Calcaneus Wound Laterality: Left, Distal Cleanser: Soap and Water 1 x Per Day/30 Days Discharge Instructions: May shower and wash wound with dial antibacterial soap and water prior to dressing change. Cleanser: Wound Cleanser 1 x Per Day/30 Days Discharge Instructions: Cleanse the wound with wound cleanser prior to applying [name] clean dressing using gauze sponges, not tissue or cotton balls. Peri-Wound Care: Zinc Oxide Ointment 30g  tube 1 x Per Day/30 Days Discharge Instructions: Apply Zinc Oxide to periwound with each dressing change Peri-Wound Care: Sween Lotion (Moisturizing lotion) 1 x Per Day/30 Days Discharge Instructions: Apply moisturizing lotion as directed Prim Dressing: Dakin's Solution 0.25%, 16 (oz) 1 x Per Day/30 Days ary Discharge Instructions: Moisten gauze with Dakin's solution Secondary Dressing: ABD Pad, 8x10 1 x Per Day/30 Days Discharge Instructions: Apply over primary dressing as directed. Secondary Dressing: Woven Gauze Sponge, Non-Sterile 4x4 in (Home Health) 1 x Per Day/30 Days Discharge Instructions: Apply over primary dressing as directed. Secured With: [employer] Roll Sterile, 4.5x3.1 (in/yd) 1 x Per Day/30 Days Discharge Instructions: Secure with Kerlix as directed. Compression Wrap: tubigrip size E 1 x Per Day/30 Days Discharge Instructions: double layer apply in clinic today. Apply in the morning and remove at night. Laboratory naerobe culture (MICRO) - [facility] Bacteria identified in Unspecified specimen by [name] LOINC Code: 635-3 Convenience Name: Anaerobic culture Electronic Signature(s) Signed: 11/05/2022 4:03:41 PM By: Geralyn CorwinHoffman, Ibrahima Holberg DO [name], [name] ([id number]) PM By: Geralyn CorwinHoffman, Kolbey Teichert DO [id number] Page 5 of 10 Signed: 11/05/2022 4:03:41 Entered By: Geralyn CorwinHoffman, Fantashia Shupert on 11/05/2022 15:55:50 -------------------------------------------------------------------------------- Problem List Details Patient Name: Date of Service: [name], [name]. 11/05/2022 3:15 PM Medical Record Number: [id number] Patient Account Number: [id number] Date of Birth/Sex: Treating RN: [birth year] (62 y.o. F) Primary Care Provider: Junious Dresserampbell, Stephen Other Clinician: Referring Provider: Treating Provider/Extender: Andree MoroHoffman, Abdinasir Spadafore Campbell, Stephen Weeks in Treatment: 35 Active Problems ICD-10 Encounter Code Description Active Date  MDM Diagnosis [phone] Non-pressure chronic ulcer of other part of left foot with fat layer exposed 03/05/2022 No Yes E11.621 Type 2 diabetes mellitus with foot ulcer 03/05/2022 No Yes E66.01 Morbid (severe) obesity due to excess calories 03/05/2022 No Yes Inactive Problems Resolved Problems Electronic Signature(s) Signed: 11/05/2022 4:03:41 PM By: Geralyn CorwinHoffman, Eather Chaires DO Entered By: Geralyn CorwinHoffman, Arcenia Scarbro on 11/05/2022 15:53:48 -------------------------------------------------------------------------------- Progress Note Details Patient Name: Date of Service: HUFFMA [name],  [name]. 11/05/2022 3:15 PM Medical Record Number: [id number] Patient Account Number: [id number] Date of Birth/Sex: Treating RN: 1961/04/01 (62 y.o. F) Primary Care Provider: [name] Other Clinician: Referring Provider: Treating Provider/Extender: [name] in Treatment: 35 Subjective Chief Complaint Information obtained from Patient 03/05/2022; left foot wound History of Present Illness (HPI) Admission 03/05/2022 Ms. Sariyah Mehlhoff is [name] 62 year old female with [name] past medical history of uncontrolled insulin-dependent type 2 diabetes, tobacco user and chronic diastolic heart failure that presents to the clinic for [name] 69-month history of wound to her left heel. She states she had [name] left ankle fusion in September 2022. She states that she always had [name] wound after the surgery and it never healed. She has home health and they have been doing compression wraps along with silver alginate to the wound bed. She currently denies signs of infection. [name], [name] ([id number]) [id number].pdf Page 6 of 10 6/8; this is [name] 62 year old woman with type 2 diabetes. She developed [name] wound on her left Achilles heel just above the tip of the heel in the setting of recurrent ankle surgeries in late 2022. I have seen some of these results from either the cast or the surgical boots that are put on after these  operations. She thinks this may be the case. And [name] sense of pressure ulcer. In any case that she has been using Santyl Hydrofera Blue under compression. She is not wearing any footwear at home as she cannot find anything to accommodate the wrap 6/22; patient presents for follow-up. She has home health that comes out once [name] week to help with dressing changes. She has no issues or complaints today. We have been using Hydrofera Blue under compression therapy. 7/6; patient presents for follow-up. She states that home health did not come out for the past 2 weeks. It is unclear why. We have been using Hydrofera Blue and Santyl under compression therapy. 7/13; patient presents for follow-up. She did not take the oral antibiotics prescribed at last clinic visit. We have been using Hydrofera Blue with gentamicin/mupirocin ointment under compression therapy. She has no issues or complaints today. She denies signs of infection. 7/20; patient presents for follow-up. We have been using Hydrofera Blue with antibiotic ointment under 3 layer compression. She has home health that change the dressing once. They put collagen on the wound bed. She also reports falling yesterday and hitting her right foot. She has no pain to the area today. 7/27; patient presents for follow-up. We have been using Hydrofera Blue under 3 layer compression. She has home health that changes the dressing. She has no issues or complaints today. 8/3; patient presents for follow-up. We continues to use Hydrofera Blue under 3 layer compression. She has no issues or complaints today. She has been approved for Epicord at 100%. 8/11; patient presents for follow-up. We have been using Hydrofera Blue under 3 layer compression. She has been approved for Epicord and we have this today. She is in agreement with having this applied. 8/18; patient presents for follow-up. Epicord #1 was placed in standard fashion last clinic visit. She has no issues or  complaints today. 9/18; Unfortunately patient has missed her last clinic appointments due to falling and breaking her right ankle. We have been following her for her left ankle wound. She has also developed [name] large blister to the left heel over the past week that has ruptured and dried. She currently resides In [facility]. Wound9/25; patient presents for follow-up.  We have been using Hydrofera Blue and Santyl to the original left ankle wound and Xeroform to the dried blistered. We have been wrapping her with compression therapy. She resides in [name] facility and they changed the wrap once this past week. She has no issues or complaints today. She denies signs of infection. 10/3; patient presents for follow-up. We have been using Xeroform to the heel wound and Epicort to the left ankle wound All under compression therapy. She has no issues or complaints today. 10/10; patient presents for follow-up. We have been using Epicort to the left ankle wound and Santyl and Hydrofera Blue to the heel wound. All under compression therapy. she has no issues or complaints today. 10/16; patient presents for follow-up. Epicort was placed in standard fashion to the superior wound and onto the heel and Santyl and Hydrofera Blue. She states that the wrap got wet during her shower and her facility was able to rewrap with Kerlix/Coban. 10/23; patient presents for follow-up. Epicord was placed to the wound beds last clinic visit. We continue Kerlix/Coban. She has no issues or complaints today. 10/31; Patient presents for follow-up. Epicord was placed to the wound beds last clinic visit. The original wound is almost healed. We have done this under Kerlix/Coban. She denies signs of infection. 11/10; patient presents for follow-up. Patient's last epi cord was placed in standard fashion at last clinic visit. The original wound has healed. She still has [name] heel wound. Grafix was approved however too costly for the patient. We  will rerun epi cord to see if she can have more applications. She had done very well with this. We have been using Hydrofera Blue to the heel under Kerlix/Coban. 11/21; patient presents for follow-up. We have been using Hydrofera Blue and Santyl to the heel under Kerlix/Coban. She switched to [name] new healthcare insurance and again The skin substitute is too costly for the patient. She denies signs of infection. 11/28; patient presents for follow-up. Her facility did not change the wrap last clinic visit due to lack of staffing. The wrap was taken off and Hydrofera Blue dressing has been in place for the past week. She has no issues or complaints today. 12/5; patient presents for follow-up. She states she has had more drainage over the past week. They did not change the wrap at her facility. She states the nurse will be back this week. She is also been doing [name] lot of physical therapy. She has been using the Prevalon boot while in bed. 12/12; patient presents for follow-up. We have been using Hydrofera Blue with antibiotic ointment under 4-layer compression. She is tolerated the wrap well. She states she is using her Prevalon boot while in the wheelchair. She has no issues or complaints today. There is been improvement in wound healing. 12/19; patient presents for follow-up. We have been using Hydrofera Blue and antibiotic ointment to the wound bed under 4-layer compression. Her facility change the wrap once. She states she has been using the Prevalon boot in bed but not in the wheelchair due to issues with transferring. Overall there is still improvement in wound healing. 1/16; patient presents for follow-up. She missed her last clinic appointment. We have been using Hydrofera Blue and antibiotic ointment under 4-layer compression. At the facility this has only been changed twice over the past 3 weeks. They are not using 4-layer compression. She came in with Kerlix/Coban. 1/23; patient presents for  follow-up. We have been using Santyl and Hydrofera Blue under 4-layer compression.  Patient has no issues or complaints today. 2/1; patient presents for follow-up. Patient's been using Santyl and Hydrofera Blue to the wound bed. She has developed more slough and now [name] mild odor. She states she is wearing the Prevalon boot at night. It is unclear if she is offloading this area during the day. Patient History Information obtained from Patient, Chart. Family History Cancer - Father,Mother, Diabetes - Mother,Maternal Grandparents,Paternal Grandparents, Heart Disease - Siblings,Maternal Grandparents, Hypertension - Siblings, Tuberculosis - Maternal Grandparents,Paternal Grandparents. Social History Current every day smoker, Marital Status - Single, Alcohol Use - Never, Drug Use - No History, Caffeine Use - Daily. 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 Vitals Time Taken: 3:19 PM, Height: 66 in, Weight: 339 lbs, BMI: 54.7, Temperature: 98.7 F, Pulse: 74 bpm, Respiratory Rate: 17 breaths/min, Blood Pressure: 134/74 mmHg. Cardiovascular 2+ dorsalis pedis/posterior tibialis pulses. Psychiatric pleasant and cooperative. General Notes: Left foot: T the heel there is an open wound with  granulation tissue and nonviable tissue to the center. No surrounding signs of infection. Mild o odor. Integumentary (Hair, Skin) Wound #2 status is Open. Original cause of wound was Blister. The date acquired was: 06/15/2022. The wound has been in treatment 18 weeks. The wound is located on the Left,Distal Calcaneus. The wound measures 3cm length x 3cm width x 0.1cm depth; 7.069cm^2 area and 0.707cm^3 volume. There is Fat Layer (Subcutaneous Tissue) exposed. There is no tunneling or undermining noted. There is [name] medium amount of serosanguineous drainage noted. Foul odor after cleansing was noted. The wound margin is distinct with the outline attached to the wound base. There is large (67-100%) red, pink granulation within the wound bed. There is [name] small (1-33%) amount of necrotic tissue within the wound bed including Adherent Slough. The periwound skin appearance had no abnormalities noted for texture. The periwound skin appearance had no abnormalities noted for color. The periwound skin appearance did not exhibit: Dry/Scaly, Maceration. Periwound temperature was noted as No Abnormality. Assessment Active Problems ICD-10 Non-pressure chronic ulcer of other part of left foot with fat layer exposed Type 2 diabetes mellitus with foot ulcer Morbid (severe) obesity due to excess calories Patient presents with more nonviable tissue to the wound bed along with [name] mild odor. She would benefit from [facility] antibiotic ointment and [name] PCR culture was obtained. No surrounding soft tissue infection. I debrided nonviable tissue. For now recommended Dakin's wet-to-dry dressings and aggressive offloading. Recommended she wear the Prevalon boot all day except for when she ambulates. Procedures Wound #2 Pre-procedure diagnosis of Wound #2 is [name] Diabetic Wound/Ulcer of the Lower Extremity located on the Left,Distal Calcaneus .Severity of Tissue Pre Debridement is: Fat layer exposed. There was [name] Selective/Open  Wound Non-Viable Tissue Debridement with [name] total area of 9 sq cm performed by Kalman Shan, DO. With the following instrument(s): Curette to remove Viable and Non-Viable tissue/material. Material removed includes [place] after achieving pain control using Lidocaine. No specimens were taken. [name] time out was conducted at 15:45,  prior to the start of the procedure. [name] Minimum amount of bleeding was controlled with Pressure. The procedure was tolerated well with [name] pain level of 0 throughout and [name] pain level of 0 following the procedure. Post Debridement Measurements: 3cm length x 3cm width x 0.1cm depth; 0.707cm^3 volume. Character of Wound/Ulcer Post Debridement is improved. Severity of Tissue Post Debridement is: Fat layer exposed. Post procedure Diagnosis Wound #2: Same as Pre-Procedure [name], [name] ([id number]) [id number] Page 8 of 10 Plan Follow-up Appointments: Return Appointment in 1 week. - Dr. Mikey BussingHoffman Thursday 11/12/22 @ 3:15 Rm # 9 w/ Maryruth BunLauran Return Appointment in 2 weeks. - Dr. Mikey BussingHoffman and Maryruth BunLauran Rm # 9 Other: - Patient to use more prevalon boot in bed and wheelchair. Do not transfer when in wheelchair. ****facility to change dressing daily.**** Anesthetic: (In clinic) Topical Lidocaine 5% applied to wound bed Cellular or Tissue Based Products: Cellular or Tissue Based Product Type: - Run IVR for EpiFix and Epicord=100% covered 05/07/22: Epicord ordered Cellular or Tissue Based Product applied to wound bed, secured with steri-strips, cover with Adaptic or Mepitel. (DO NOT REMOVE). - Epicord #1 05/15/25 Epicord #2 05/22/22 Epicord #3 06/29/2022 Epicord #4 07/07/2022 Epifix #5 07/14/22 Epicord #6 07/20/22 Epicord # 7 07/27/22 ***Run for Grafix***PENDING Epicord #8 08/04/2022 Edema Control - Lymphedema / SCD / Other: Elevate legs to the level of the heart or above for 30 minutes daily and/or when sitting for 3-4 times [name] day throughout the day. Avoid standing for long  periods of time. Off-Loading: Prevalon Boot - use while in bed and chair. Additional Orders / Instructions: Follow Nutritious Diet - Monitor/Control Blood Sugar Laboratory ordered were: Anaerobic culture - Left heel-send to [facility] once it comes back WOUND #2: - Calcaneus Wound Laterality: Left, Distal Cleanser: Soap and Water 1 x Per Day/30 Days Discharge Instructions: May shower and wash wound with dial antibacterial soap and water prior to dressing change. Cleanser: Wound Cleanser 1 x Per Day/30 Days Discharge Instructions: Cleanse the wound with wound cleanser prior to applying [name] clean dressing using gauze sponges, not tissue or cotton balls. Peri-Wound Care: Zinc Oxide Ointment 30g tube 1 x Per Day/30 Days Discharge Instructions: Apply Zinc Oxide to periwound with each dressing change Peri-Wound Care: Sween Lotion (Moisturizing lotion) 1 x Per Day/30 Days Discharge Instructions: Apply moisturizing lotion as directed Prim Dressing: Dakin's Solution 0.25%, 16 (oz) 1 x Per Day/30 Days ary Discharge Instructions: Moisten gauze with Dakin's solution Secondary Dressing: ABD Pad, 8x10 1 x Per Day/30 Days Discharge Instructions: Apply over primary dressing as directed. Secondary Dressing: Woven Gauze Sponge, Non-Sterile 4x4 in (Home Health) 1 x Per Day/30 Days Discharge Instructions: Apply over primary dressing as directed. Secured With: [employer] Roll Sterile, 4.5x3.1 (in/yd) 1 x Per Day/30 Days Discharge Instructions: Secure with Kerlix as directed. Com pression Wrap: tubigrip size E 1 x Per Day/30 Days Discharge Instructions: double layer apply in clinic today. Apply in the morning and remove at night. 1. PCR culture- [facility] antibiotic ointment 2. Dakins wet to dry dressing 3. Aggressive offloading - prevalon boot 4. Follow up in one week Electronic Signature(s) Signed: 11/05/2022 4:03:41 PM By: Geralyn CorwinHoffman, Carrieann Spielberg DO Entered By: Geralyn CorwinHoffman, Sacora Hawbaker on 11/05/2022  16:02:43 -------------------------------------------------------------------------------- HxROS Details Patient Name: Date of Service: [name], [name]. 11/05/2022 3:15 PM Medical Record Number: [id number] Patient Account Number: [id number] Date of Birth/Sex: Treating RN: [birth year] (62 y.o. F) Primary Care Provider: Junious Dresserampbell, Stephen Other Clinician: Referring Provider: Treating Provider/Extender: Andree MoroHoffman, Ninoshka Wainwright Campbell, Stephen Weeks in  Treatment: 35 Information Obtained From Patient Chart [name], [name] ([id number]) [id number] Page 9 of 10 Hematologic/Lymphatic Medical History: Positive for: Anemia Respiratory Medical History: Positive for: Chronic Obstructive Pulmonary Disease (COPD) Cardiovascular Medical History: Positive for: Congestive Heart Failure - Weighs self daily; Hypertension; Myocardial Infarction; Peripheral Venous Disease Gastrointestinal Medical History: Past Medical History Notes: GERD, Peptic Ulcer Disease Endocrine Medical History: Positive for: Type II Diabetes Time with diabetes: 16 years Treated with: Insulin, Oral agents Blood sugar tested every day: Yes Tested : Twice daily Musculoskeletal Medical History: Past Medical History Notes: Broke left leg 3 years ago, 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, foot fused Neurologic Medical History: Positive for: Neuropathy Psychiatric Medical History: Past Medical History Notes: Depression Immunizations Pneumococcal Vaccine: Received Pneumococcal Vaccination: No Implantable Devices None Family and Social History Cancer: Yes - Father,Mother; Diabetes: Yes - Mother,Maternal Grandparents,Paternal Grandparents; Heart Disease: Yes - Siblings,Maternal Grandparents; Hypertension: Yes - Siblings; Tuberculosis: Yes - Maternal Grandparents,Paternal Grandparents; Current every day smoker; Marital Status - Single; Alcohol Use: Never; Drug Use: No History; Caffeine Use: Daily; Financial Concerns:  No; Food, Clothing or Shelter Needs: No; Support System Lacking: No; Transportation Concerns: No Electronic Signature(s) Signed: 11/05/2022 4:03:41 PM By: Geralyn Corwin DO Entered By: Geralyn Corwin on 11/05/2022 15:54:54 -------------------------------------------------------------------------------- SuperBill Details Patient Name: Date of Service: [name] Bushy [name]. 11/05/2022 Medical Record Number: [id number] Patient Account Number: [id number] Date of Birth/Sex: Treating RN: 06-18-1961 (61 y.o. F) [name], [name] ([id number]) [id number] Page 10 of 10 Primary Care Provider: [name] Other Clinician: Referring Provider: Treating Provider/Extender: [name] in Treatment: 35 Diagnosis Coding ICD-10 Codes Code Description [phone] Non-pressure chronic ulcer of other part of left foot with fat layer exposed E11.621 Type 2 diabetes mellitus with foot ulcer E66.01 Morbid (severe) obesity due to excess calories Facility Procedures : CPT4 Code: [id number] Description: 97597 - DEBRIDE WOUND 1ST 20 SQ CM OR < ICD-10 Diagnosis Description L97.522 Non-pressure chronic ulcer of other part of left foot with fat layer exposed E11.621 Type 2 diabetes mellitus with foot ulcer Modifier: Quantity: 1 Physician Procedures : CPT4 Code Description Modifier [id number] - WC PHYS DEBR WO ANESTH 20 SQ CM ICD-10 Diagnosis Description L97.522 Non-pressure chronic ulcer of other part of left foot with fat layer exposed E11.621 Type 2 diabetes mellitus with foot ulcer Quantity: 1 Electronic Signature(s) Signed: 11/05/2022 4:03:41 PM By: Geralyn Corwin DO Entered By: Geralyn Corwin on 11/05/2022 16:02:59

## 2022-11-16 ENCOUNTER — Encounter (HOSPITAL_BASED_OUTPATIENT_CLINIC_OR_DEPARTMENT_OTHER): Payer: 59 | Admitting: Internal Medicine

## 2022-11-17 ENCOUNTER — Encounter (HOSPITAL_BASED_OUTPATIENT_CLINIC_OR_DEPARTMENT_OTHER): Payer: 59 | Admitting: Internal Medicine

## 2022-11-17 DIAGNOSIS — L97522 Non-pressure chronic ulcer of other part of left foot with fat layer exposed: Secondary | ICD-10-CM

## 2022-11-17 DIAGNOSIS — E11621 Type 2 diabetes mellitus with foot ulcer: Secondary | ICD-10-CM | POA: Diagnosis not present

## 2022-11-19 NOTE — Progress Notes (Signed)
[name], [name] ([id number]) [id number].pdf Page 1 of 8 Visit Report for 11/17/2022 Arrival Information Details Patient Name: Date of Service: [name] C. 11/17/2022 2:00 PM Medical Record Number: [id number] Patient Account Number: [id number] Date of Birth/Sex: Treating RN: [birth year] (62 y.o. Tonita [name], [name] Primary Care Zafira Munos: Jenean Lindau Other Clinician: Referring Darcella Shiffman: Treating Arloa Prak/Extender: Elise Benne in Treatment: 62 Visit Information History Since Last Visit Added or deleted any medications: No Patient Arrived: Wheel Chair Any new allergies or adverse reactions: No Arrival Time: 14:07 Had a fall or experienced change in No Accompanied By: self activities of daily living that may affect Transfer Assistance: Manual risk of falls: Patient Identification Verified: Yes Signs or symptoms of abuse/neglect since last visito No Secondary Verification Process Completed: Yes Hospitalized since last visit: No Patient Requires Transmission-Based Precautions: No Implantable device outside of the clinic excluding No Patient Has Alerts: No cellular tissue based products placed in the center since last visit: Has Dressing in Place as Prescribed: Yes Has Compression in Place as Prescribed: Yes Pain Present Now: Yes Electronic Signature(s) Signed: 11/18/2022 4:47:24 PM By: Rhae Hammock RN Entered By: Rhae Hammock on 11/17/2022 14:08:10 -------------------------------------------------------------------------------- Encounter Discharge Information Details Patient Name: Date of Service: [name] C. 11/17/2022 2:00 PM Medical Record Number: [id number] Patient Account Number: [id number] Date of Birth/Sex: Treating RN: 27-Sep-1961 (62 y.o. Tonita [name], [name] Primary Care [name]: Jenean Lindau Other Clinician: Referring Shataria Crist: Treating Lelah Rennaker/Extender: Elise Benne in Treatment: 36 Encounter Discharge Information Items Post Procedure Vitals Discharge Condition: Stable Temperature (F): 98.7 Ambulatory Status: Wheelchair Pulse (bpm): 74 Discharge Destination: Home Respiratory Rate (breaths/min): 17 Transportation: Private Auto Blood Pressure (mmHg): 120/80 Accompanied By: self Schedule Follow-up Appointment: Yes Clinical Summary of Care: Patient Declined Electronic Signature(s) Signed: 11/18/2022 4:47:24 PM By: Rhae Hammock RN Entered By: Rhae Hammock on 11/17/2022 14:33:35 [name], [name] ([id number] Page 2 of 8 -------------------------------------------------------------------------------- Lower Extremity Assessment Details Patient Name: Date of Service: [name] C. 11/17/2022 2:00 PM Medical Record Number: [id number] Patient Account Number: [id number] Date of Birth/Sex: Treating RN: 1961-01-20 (62 y.o. Tonita [name], [name] Primary Care [name]: Jenean Lindau Other Clinician: Referring Dilpreet Faires: Treating Ellarie Picking/Extender: Elise Benne in Treatment: 36 Edema Assessment Assessed: Shirlyn Goltz: Yes] Patrice Paradise: No] Edema: [Left: Ye] [Right: s] Calf Left: Right: Point of Measurement: 28 cm From Medial Instep 53.5 cm Ankle Left: Right: Point of Measurement: 3 cm From Medial Instep 30 cm Vascular Assessment Pulses: Dorsalis Pedis Palpable: [Left:Yes] Posterior Tibial Palpable: [Left:Yes] Electronic Signature(s) Signed: 11/18/2022 4:47:24 PM By: Rhae Hammock RN Entered By: Rhae Hammock on 11/17/2022 14:09:04 -------------------------------------------------------------------------------- Multi Wound Chart Details Patient Name: Date of Service: [name] C. 11/17/2022 2:00 PM Medical Record Number: [id number] Patient Account Number: [id number] Date of Birth/Sex: Treating RN: [birth year] (62 y.o. 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: Extremity Anemia, Chronic Obstructive N/A N/A Comorbid History: Pulmonary Disease (COPD), Congestive Heart Failure, Hypertension, Myocardial Infarction, Peripheral Venous Disease, Type II Diabetes, Neuropathy 06/15/2022 N/A N/A Date Acquired: 20 N/A N/A Weeks of Treatment: Open N/A N/A Wound Status: No N/A N/A Wound  Recurrence: 3x5x0.1 N/A N/A Measurements L x W x D (cm) 11.781 N/A N/A A (cm) : rea 1.178 N/A N/A Volume (cm) : 16.70% N/A N/A % Reduction in A rea: 16.70% N/A N/A % Reduction in Volume: Grade 2 N/A N/A Classification: Medium N/A N/A Exudate A mount: Serosanguineous N/A N/A Exudate Type: red, brown N/A N/A Exudate Color: Yes N/A N/A Foul Odor A Cleansing: fter No N/A N/A Odor A nticipated Due to Product Use: Distinct, outline attached N/A N/A Wound Margin: Large (67-100%) N/A N/A Granulation A mount: Red, Pink N/A N/A Granulation Quality: Small (1-33%) N/A N/A Necrotic A mount: Fat Layer (Subcutaneous Tissue): Yes N/A N/A Exposed Structures: Fascia: No Tendon: No Muscle: No Joint: No Bone: No Medium (34-66%) N/A N/A Epithelialization: Debridement - Selective/Open Wound N/A N/A Debridement: Pre-procedure Verification/Time Out 14:25 N/A N/A Taken: Lidocaine N/A N/A Pain Control: Slough N/A N/A Tissue Debrided: Skin/Epidermis N/A N/A Level: 15 N/A N/A Debridement A (sq cm): rea Curette N/A N/A Instrument: Minimum N/A  N/A Bleeding: Pressure N/A N/A Hemostasis A chieved: 0 N/A N/A Procedural Pain: 0 N/A N/A Post Procedural Pain: Procedure was tolerated well N/A N/A Debridement Treatment Response: 3x5x0.1 N/A N/A Post Debridement Measurements L x W x D (cm) 1.178 N/A N/A Post Debridement Volume: (cm) No Abnormalities Noted N/A N/A Periwound Skin Texture: Maceration: No N/A N/A Periwound Skin Moisture: Dry/Scaly: No No Abnormalities Noted N/A N/A Periwound Skin Color: No Abnormality N/A N/A Temperature: Debridement N/A N/A Procedures Performed: Treatment Notes Wound #2 (Calcaneus) Wound Laterality: Left, Distal Cleanser Soap and Water Discharge Instruction: May shower and wash wound with dial antibacterial soap and water prior to dressing change. Wound Cleanser Discharge Instruction: Cleanse the wound with wound cleanser prior to applying a clean dressing using gauze sponges, not tissue or cotton balls. Peri-Wound Care Zinc Oxide Ointment 30g tube Discharge Instruction: Apply Zinc Oxide to periwound with each dressing change Sween Lotion (Moisturizing lotion) Discharge Instruction: Apply moisturizing lotion as directed Topical [facility] Dressing Morrill, Ximenna C ([id number] Page 4 of 8 Hydrofera Blue Ready Transfer Foam, 8x8 (in/in) Discharge Instruction: Apply to wound bed as instructed Secondary Dressing ABD Pad, 8x10 Discharge Instruction: Apply over primary dressing as directed. Woven Gauze Sponge, Non-Sterile 4x4 in Discharge Instruction: Apply over primary dressing as directed. Secured With [employer], 4.5x3.1 (in/yd) Discharge Instruction: Secure with Kerlix as directed. Compression Wrap tubigrip size E Discharge Instruction: double layer apply in clinic today. Apply in the morning and remove at night. Compression Stockings Add-Ons Electronic Signature(s) Signed: 11/17/2022 3:49:14 PM By: Kalman Shan DO Entered  By: Kalman Shan on 11/17/2022 14:55:33 -------------------------------------------------------------------------------- Multi-Disciplinary Care Plan Details Patient Name: Date of Service: [name] C. 11/17/2022 2:00 PM Medical Record Number: [id number] Patient Account Number: [id number] Date of Birth/Sex: Treating RN: 20-Nov-1960 (62 y.o. Tonita [name], [name] Primary Care Deija Buhrman: Jenean Lindau Other Clinician: Referring Milianna Ericsson: Treating Akera Snowberger/Extender: Elise Benne in Treatment: 36 Active Inactive Abuse / Safety / Falls / Self Care Management Nursing Diagnoses: History of Falls Goals: Patient will not experience any injury related to falls Date Initiated: 03/05/2022 Target Resolution Date: 12/04/2022 Goal Status: Active Interventions: Assess fall risk on admission and as needed Assess: immobility, friction, shearing, incontinence upon admission and as needed Assess impairment of mobility on admission and as needed per policy Notes: [id number]: Fall prevention ongoing, recent fall. Wound/Skin Impairment Nursing Diagnoses: Impaired tissue integrity Goals: Patient/caregiver will verbalize understanding of skin care regimen Date Initiated: 03/05/2022 Target Resolution Date:  12/04/2022 Goal Status: Active Ulcer/skin breakdown will have a volume reduction of 30% by week 4 Brew, Classie C ([id number]) [id number] Page 5 of 8 Date Initiated: 03/05/2022 Date Inactivated: 04/30/2022 Target Resolution Date: 05/02/2022 Goal Status: Met Ulcer/skin breakdown will have a volume reduction of 50% by week 8 Date Initiated: 04/30/2022 Date Inactivated: 07/07/2022 Target Resolution Date: 07/04/2022 Unmet Reason: patient sent three Goal Status: Unmet weeks inpatient and rehab before returning to wound center. Interventions: Assess patient/caregiver ability to obtain necessary supplies Assess patient/caregiver ability to perform  ulcer/skin care regimen upon admission and as needed Assess ulceration(s) every visit Provide education on smoking Provide education on ulcer and skin care Treatment Activities: Topical wound management initiated : 03/05/2022 Notes: 04/30/22: Wound care regimen continues. Electronic Signature(s) Signed: 11/18/2022 4:47:24 PM By: Rhae Hammock RN Entered By: Rhae Hammock on 11/17/2022 14:31:59 -------------------------------------------------------------------------------- Pain Assessment Details Patient Name: Date of Service: [name] 11/17/2022 2:00 PM Medical Record Number: [id number] Patient Account Number: [id number] Date of Birth/Sex: Treating RN: Jan 31, 1961 (62 y.o. Tonita [name], [name] Primary Care Merlen Gurry: Jenean Lindau Other Clinician: Referring Mykle Pascua: Treating Posey Petrik/Extender: Elise Benne in Treatment: 36 Active Problems Location of Pain Severity and Description of Pain Patient Has Paino Yes Site Locations Pain Location: Pain in Ulcers With Dressing Change: Yes Duration of the Pain. Constant / Intermittento Intermittent Rate the pain. Current Pain Level: 7 Worst Pain Level: 10 Least Pain Level: 0 Tolerable Pain Level: 7 Character of Pain Describe the Pain: Aching Pain Management and Medication Current Pain Management: Medication: No Cold Application: No Rest: No Massage: No Activity: No T.E.N.S.: No Heat Application: No Leg drop or elevation: No Is the Current Pain Management Adequate: Adequate Busta, Zamani C ([id number] Page 6 of 8 How does your wound impact your activities of daily livingo Sleep: No Bathing: No Appetite: No Relationship With Others: No Bladder Continence: No Emotions: No Bowel Continence: No Work: No Toileting: No Drive: No Dressing: No Hobbies: No Electronic Signature(s) Signed: 11/18/2022 4:47:24 PM By: Rhae Hammock RN Entered  By: Rhae Hammock on 11/17/2022 14:08:55 -------------------------------------------------------------------------------- Patient/Caregiver Education Details Patient Name: Date of Service: [name] 2/13/2024andnbsp2:00 PM Medical Record Number: [id number] Patient Account Number: [id number] Date of Birth/Gender: Treating RN: 04-02-1961 (62 y.o. Tonita [name], [name] Primary Care Physician: Jenean Lindau Other Clinician: Referring Physician: Treating Physician/Extender: Elise Benne in Treatment: 36 Education Assessment Education Provided To: Patient Education Topics Provided Wound/Skin Impairment: Methods: Explain/Verbal Responses: Reinforcements needed, State content correctly Electronic Signature(s) Signed: 11/18/2022 4:47:24 PM By: Rhae Hammock RN Entered By: Rhae Hammock on 11/17/2022 14:32:12 -------------------------------------------------------------------------------- Wound Assessment Details Patient Name: Date of Service: [name] C. 11/17/2022 2:00 PM Medical Record Number: [id number] Patient Account Number: [id number] Date of Birth/Sex: Treating RN: 05/01/1961 (62 y.o. Tonita [name], [name] Primary Care Lenzy Kerschner: Jenean Lindau Other Clinician: Referring Kolette Vey: Treating Makyna Niehoff/Extender: Elise Benne in Treatment: 36 Wound Status Wound Number: 2 Primary Diabetic Wound/Ulcer of the Lower Extremity Etiology: Wound Location: Left, Distal Calcaneus Wound Open Wounding Event: Blister Status: Date Acquired: 06/15/2022 Comorbid Anemia, Chronic Obstructive Pulmonary Disease (COPD), Weeks Of Treatment: 20 History: Congestive Heart Failure, Hypertension, Myocardial Infarction, Clustered Wound: No Peripheral Venous Disease, Type II Diabetes, Neuropathy Photos Dejarnett, Rheanne C ([id number] Page 7 of 8 Wound Measurements Length: (cm) 3 Width:  (cm) 5 Depth: (cm) 0.1 Area: (cm) 11.781 Volume: (cm) 1.178 % Reduction in Area: 16.7% % Reduction in Volume: 16.7% Epithelialization: Medium (34-66%)  Tunneling: No Undermining: No Wound Description Classification: Grade 2 Wound Margin: Distinct, outline attached Exudate Amount: Medium Exudate Type: Serosanguineous Exudate Color: red, brown Foul Odor After Cleansing: Yes Due to Product Use: No Slough/Fibrino Yes Wound Bed Granulation Amount: Large (67-100%) Exposed Structure Granulation Quality: Red, Pink Fascia Exposed: No Necrotic Amount: Small (1-33%) Fat Layer (Subcutaneous Tissue) Exposed: Yes Necrotic Quality: Adherent Slough Tendon Exposed: No Muscle Exposed: No Joint Exposed: No Bone Exposed: No Periwound Skin Texture Texture Color No Abnormalities Noted: Yes No Abnormalities Noted: Yes Moisture Temperature / Pain No Abnormalities Noted: No Temperature: No Abnormality Dry / Scaly: No Maceration: No Treatment Notes Wound #2 (Calcaneus) Wound Laterality: Left, Distal Cleanser Soap and Water Discharge Instruction: May shower and wash wound with dial antibacterial soap and water prior to dressing change. Wound Cleanser Discharge Instruction: Cleanse the wound with wound cleanser prior to applying a clean dressing using gauze sponges, not tissue or cotton balls. Peri-Wound Care Zinc Oxide Ointment 30g tube Discharge Instruction: Apply Zinc Oxide to periwound with each dressing change Sween Lotion (Moisturizing lotion) Discharge Instruction: Apply moisturizing lotion as directed Topical [facility] Primary Dressing Hydrofera Blue Ready Transfer Foam, 8x8 (in/in) Discharge Instruction: Apply to wound bed as instructed Secondary Dressing ABD Pad, 8x10 Discharge Instruction: Apply over primary dressing as directed. Woven Gauze Sponge, Non-Sterile 4x4 in Discharge Instruction: Apply over primary dressing as directed. Mitten, Sherlie C ([id number])  [id number].pdf Page 8 of 8 Secured With [employer], 4.5x3.1 (in/yd) Discharge Instruction: Secure with Kerlix as directed. Compression Wrap tubigrip size E Discharge Instruction: double layer apply in clinic today. Apply in the morning and remove at night. Compression Stockings Add-Ons Electronic Signature(s) Signed: 11/18/2022 4:47:24 PM By: Rhae Hammock RN Entered By: Rhae Hammock on 11/17/2022 14:18:59 -------------------------------------------------------------------------------- Vitals Details Patient Name: Date of Service: Laramie C. 11/17/2022 2:00 PM Medical Record Number: [id number] Patient Account Number: [id number] Date of Birth/Sex: Treating RN: 23-Jun-1961 (62 y.o. Tonita [name], [name] Primary Care Maricruz Lucero: Jenean Lindau Other Clinician: Referring Ulyssa Walthour: Treating Danis Pembleton/Extender: Elise Benne in Treatment: 36 Vital Signs Time Taken: 14:08 Temperature (F): 98.5 Height (in): 66 Pulse (bpm): 102 Weight (lbs): 339 Respiratory Rate (breaths/min): 17 Body Mass Index (BMI): 54.7 Blood Pressure (mmHg): 136/75 Capillary Blood Glucose (mg/dl): 136 Reference Range: 80 - 120 mg / dl Electronic Signature(s) Signed: 11/18/2022 4:47:24 PM By: Rhae Hammock RN Entered By: Rhae Hammock on 11/17/2022 14:08:31

## 2022-11-19 NOTE — Progress Notes (Signed)
Ganger, Aurore C ([id number]) [id number] Page 1 of 10 Visit Report for 11/17/2022 Chief Complaint Document Details Patient Name: Date of Service: [name] C. 11/17/2022 2:00 PM Medical Record Number: [id number] Patient Account Number: [id number] Date of Birth/Sex: Treating RN: 06-19-1961 (62 y.o. F) Primary Care Provider: Jenean Lindau Other Clinician: Referring Provider: Treating Provider/Extender: Elise Benne in [id number] Information Obtained from: Patient Chief Complaint 03/05/2022; left foot wound Electronic Signature(s) Signed: 11/17/2022 3:49:14 PM By: Kalman Shan DO Entered By: Kalman Shan on 11/17/2022 14:55:43 -------------------------------------------------------------------------------- Debridement Details Patient Name: Date of Service: [name] C. 11/17/2022 2:00 PM Medical Record Number: [id number] Patient Account Number: [id number] Date of Birth/Sex: Treating RN: 12-Oct-1960 (62 y.o. [name], [name] Primary Care Provider: Jenean Lindau Other Clinician: Referring Provider: Treating Provider/Extender: Elise Benne in Treatment: 36 Debridement Performed for Assessment: Wound #2 Left,Distal Calcaneus Performed By: Physician Kalman Shan, DO Debridement Type: Debridement Severity of Tissue Pre Debridement: Fat layer exposed Level of Consciousness (Pre-procedure): Awake and Alert Pre-procedure Verification/Time Out Yes - 14:25 Taken: Start Time: 14:25 Pain Control: Lidocaine T Area Debrided (L x W): otal 3 (cm) x 5 (cm) = 15 (cm) Tissue and other material debrided: Viable, Non-Viable, Slough, Skin: Dermis , Skin: Epidermis, Slough Level: Skin/Epidermis Debridement Description: Selective/Open Wound Instrument: Curette Bleeding: Minimum Hemostasis Achieved: Pressure End Time: 14:25 Procedural Pain: 0 Post Procedural Pain: 0 Response to  Treatment: Procedure was tolerated well Level of Consciousness (Post- Awake and Alert procedure): Post Debridement Measurements of Total Wound Length: (cm) 3 Width: (cm) 5 Depth: (cm) 0.1 Volume: (cm) 1.178 Character of Wound/Ulcer Post Debridement: Improved Severity of Tissue Post Debridement: Fat layer exposed Birnie, Jahzaria C ([id number]) [id number] Page 2 of 10 Post Procedure Diagnosis Same as Pre-procedure Electronic Signature(s) Signed: 11/17/2022 3:49:14 PM By: Kalman Shan DO Signed: 11/18/2022 4:47:24 PM By: Rhae Hammock RN Entered By: Rhae Hammock on 11/17/2022 14:31:41 -------------------------------------------------------------------------------- HPI Details Patient Name: Date of Service: [name], [name]. 11/17/2022 2:00 PM Medical Record Number: [id number] Patient Account Number: [id number] Date of Birth/Sex: Treating RN: 1961-07-09 (62 y.o. F) Primary Care Provider: Jenean Lindau Other Clinician: Referring Provider: Treating Provider/Extender: Elise Benne in Treatment: 11 History of Present Illness HPI Description: Admission 03/05/2022 Ms. Tyeasha Allyn is a 62 year old female with a past medical history of uncontrolled insulin-dependent type 2 diabetes, tobacco user and chronic diastolic heart failure that presents to the clinic for a 22-monthhistory of wound to her left heel. She states she had a left ankle fusion in September 2022. She states that she always had a wound after the surgery and it never healed. She has home health and they have been doing compression wraps along with silver alginate to the wound bed. She currently denies signs of infection. 6/8; this is a 62year old woman with type 2 diabetes. She developed a wound on her left Achilles heel just above the tip of the heel in the setting of recurrent ankle surgeries in late 2022. I have seen some of these results from either the cast  or the surgical boots that are put on after these operations. She thinks this may be the case. And a sense of pressure ulcer. In any case that she has been using Santyl Hydrofera Blue under compression. She is not wearing any footwear at home as she cannot find anything to accommodate the wrap 6/22; patient presents for follow-up. She has home health that comes out once a week  to help with dressing changes. She has no issues or complaints today. We have been using Hydrofera Blue under compression therapy. 7/6; patient presents for follow-up. She states that home health did not come out for the past 2 weeks. It is unclear why. We have been using Hydrofera Blue and Santyl under compression therapy. 7/13; patient presents for follow-up. She did not take the oral antibiotics prescribed at last clinic visit. We have been using Hydrofera Blue with gentamicin/mupirocin ointment under compression therapy. She has no issues or complaints today. She denies signs of infection. 7/20; patient presents for follow-up. We have been using Hydrofera Blue with antibiotic ointment under 3 layer compression. She has home health that change the dressing once. They put collagen on the wound bed. She also reports falling yesterday and hitting her right foot. She has no pain to the area today. 7/27; patient presents for follow-up. We have been using Hydrofera Blue under 3 layer compression. She has home health that changes the dressing. She has no issues or complaints today. 8/3; patient presents for follow-up. We continues to use Hydrofera Blue under 3 layer compression. She has no issues or complaints today. She has been approved for Epicord at 100%. 8/11; patient presents for follow-up. We have been using Hydrofera Blue under 3 layer compression. She has been approved for Epicord and we have this today. She is in agreement with having this applied. 8/18; patient presents for follow-up. Epicord #1 was placed in standard  fashion last clinic visit. She has no issues or complaints today. 9/18; Unfortunately patient has missed her last clinic appointments due to falling and breaking her right ankle. We have been following her for her left ankle wound. She has also developed a large blister to the left heel over the past week that has ruptured and dried. She currently resides In [facility]. Wound9/25; patient presents for follow-up. We have been using Hydrofera Blue and Santyl to the original left ankle wound and Xeroform to the dried blistered. We have been wrapping her with compression therapy. She resides in a facility and they changed the wrap once this past week. She has no issues or complaints today. She denies signs of infection. 10/3; patient presents for follow-up. We have been using Xeroform to the heel wound and Epicort to the left ankle wound All under compression therapy. She has no issues or complaints today. 10/10; patient presents for follow-up. We have been using Epicort to the left ankle wound and Santyl and Hydrofera Blue to the heel wound. All under compression therapy. she has no issues or complaints today. 10/16; patient presents for follow-up. Epicort was placed in standard fashion to the superior wound and onto the heel and Santyl and Hydrofera Blue. She states that the wrap got wet during her shower and her facility was able to rewrap with Kerlix/Coban. 10/23; patient presents for follow-up. Epicord was placed to the wound beds last clinic visit. We continue Kerlix/Coban. She has no issues or complaints today. 10/31; Patient presents for follow-up. Epicord was placed to the wound beds last clinic visit. The original wound is almost healed. We have done this under Kerlix/Coban. She denies signs of infection. Chrestman, Taniqua C ([id number]) [id number] Page 3 of 10 11/10; patient presents for follow-up. Patient's last epi cord was placed in standard fashion at last clinic  visit. The original wound has healed. She still has a heel wound. Grafix was approved however too costly for the patient. We will rerun epi cord to see if  she can have more applications. She had done very well with this. We have been using Hydrofera Blue to the heel under Kerlix/Coban. 11/21; patient presents for follow-up. We have been using Hydrofera Blue and Santyl to the heel under Kerlix/Coban. She switched to a new healthcare insurance and again The skin substitute is too costly for the patient. She denies signs of infection. 11/28; patient presents for follow-up. Her facility did not change the wrap last clinic visit due to lack of staffing. The wrap was taken off and Hydrofera Blue dressing has been in place for the past week. She has no issues or complaints today. 12/5; patient presents for follow-up. She states she has had more drainage over the past week. They did not change the wrap at her facility. She states the nurse will be back this week. She is also been doing a lot of physical therapy. She has been using the Prevalon boot while in bed. 12/12; patient presents for follow-up. We have been using Hydrofera Blue with antibiotic ointment under 4-layer compression. She is tolerated the wrap well. She states she is using her Prevalon boot while in the wheelchair. She has no issues or complaints today. There is been improvement in wound healing. 12/19; patient presents for follow-up. We have been using Hydrofera Blue and antibiotic ointment to the wound bed under 4-layer compression. Her facility change the wrap once. She states she has been using the Prevalon boot in bed but not in the wheelchair due to issues with transferring. Overall there is still improvement in wound healing. 1/16; patient presents for follow-up. She missed her last clinic appointment. We have been using Hydrofera Blue and antibiotic ointment under 4-layer compression. At the facility this has only been changed twice  over the past 3 weeks. They are not using 4-layer compression. She came in with Kerlix/Coban. 1/23; patient presents for follow-up. We have been using Santyl and Hydrofera Blue under 4-layer compression. Patient has no issues or complaints today. 2/1; patient presents for follow-up. Patient's been using Santyl and Hydrofera Blue to the wound bed. She has developed more slough and now a mild odor. She states she is wearing the Prevalon boot at night. It is unclear if she is offloading this area during the day. 2/13; patient presents for follow-up. Patient's been using Dakin's wet-to-dry dressings. She received her Keystone antibiotic ointment in the mail. She has not used this yet. She reports using her Prevalon boot. She has no issues or complaints today. Electronic Signature(s) Signed: 11/17/2022 3:49:14 PM By: Kalman Shan DO Entered By: Kalman Shan on 11/17/2022 14:56:41 -------------------------------------------------------------------------------- Physical Exam Details Patient Name: Date of Service: [name] 11/17/2022 2:00 PM Medical Record Number: [id number] Patient Account Number: [id number] Date of Birth/Sex: Treating RN: Oct 17, 1960 (62 y.o. F) Primary Care Provider: Jenean Lindau Other Clinician: Referring Provider: Treating Provider/Extender: Elise Benne in Treatment: 36 Constitutional respirations regular, non-labored and within target range for patient.. Cardiovascular 2+ dorsalis pedis/posterior tibialis pulses. Psychiatric pleasant and cooperative. Notes Left foot: T the heel there is an open wound with granulation tissue and nonviable tissue to the center. No surrounding signs of soft tissue infection. o Electronic Signature(s) Signed: 11/17/2022 3:49:14 PM By: Kalman Shan DO Entered By: Kalman Shan on 11/17/2022  14:57:14 -------------------------------------------------------------------------------- Physician Orders Details Patient Name: Date of Service: [place] C. 11/17/2022 2:00 PM Dieujuste, Krystle C ([id number]) [id number] Page 4 of 10 Medical Record Number: [id number] Patient Account Number: [id number] Date of Birth/Sex: Treating  RN: 07-21-1961 (62 y.o. [name], [name] Primary Care Provider: Jenean Lindau Other Clinician: Referring Provider: Treating Provider/Extender: Elise Benne in [id number] Verbal / Phone Orders: No Diagnosis Coding Follow-up Appointments ppointment in 1 week. - Dr. Heber [place] [facility] [place] Rm # 8 Tuesday 11/24/22 @ 11:00 (already has appt.) AND Thursday 11/26/22 @ 11:00 Rm Return A #9 ***Plan for TCC***EXTRA TIME*** ppointment in 2 weeks. - Dr. Heber [place] and Allayne Butcher Rm # 9 Tuesday 12/01/22 @ 10:15 Return A Other: - Patient to use more prevalon boot in bed and wheelchair. Do not transfer when in wheelchair. ****facility to change dressing daily.**** Anesthetic (In clinic) Topical Lidocaine 5% applied to wound bed Cellular or Tissue Based Products Cellular or Tissue Based Product Type: - Run IVR for EpiFix and Epicord=100% covered 05/07/22: Epicord ordered daptic or Mepitel. (DO NOT REMOVE). - Cellular or Tissue Based Product applied to wound bed, secured with steri-strips, cover with A Epicord #1 05/15/25 Epicord #2 05/22/22 Epicord #3 06/29/2022 Epicord #4 07/07/2022 Epifix #5 07/14/22 Epicord #6 07/20/22 Epicord # 7 07/27/22 ***Run for Grafix***PENDING Epicord #8 08/04/2022 Edema Control - Lymphedema / SCD / Other Elevate legs to the level of the heart or above for 30 minutes daily and/or when sitting for 3-4 times a day throughout the day. Avoid standing for long periods of time. Off-Loading Prevalon Boot - use while in bed and chair. Additional Orders / Instructions Follow Nutritious Diet -  Monitor/Control Blood Sugar Wound Treatment Wound #2 - Calcaneus Wound Laterality: Left, Distal Cleanser: Soap and Water 1 x Per Day/30 Days Discharge Instructions: May shower and wash wound with dial antibacterial soap and water prior to dressing change. Cleanser: Wound Cleanser 1 x Per Day/30 Days Discharge Instructions: Cleanse the wound with wound cleanser prior to applying a clean dressing using gauze sponges, not tissue or cotton balls. Peri-Wound Care: Zinc Oxide Ointment 30g tube 1 x Per Day/30 Days Discharge Instructions: Apply Zinc Oxide to periwound with each dressing change Peri-Wound Care: Sween Lotion (Moisturizing lotion) 1 x Per Day/30 Days Discharge Instructions: Apply moisturizing lotion as directed Topical: Keystone 1 x Per Day/30 Days Prim Dressing: Hydrofera Blue Ready Transfer Foam, 8x8 (in/in) 1 x Per Day/30 Days ary Discharge Instructions: Apply to wound bed as instructed Secondary Dressing: ABD Pad, 8x10 1 x Per Day/30 Days Discharge Instructions: Apply over primary dressing as directed. Secondary Dressing: Woven Gauze Sponge, Non-Sterile 4x4 in (Home Health) 1 x Per Day/30 Days Discharge Instructions: Apply over primary dressing as directed. Secured With: [employer], 4.5x3.1 (in/yd) 1 x Per Day/30 Days Discharge Instructions: Secure with Kerlix as directed. Compression Wrap: tubigrip size E 1 x Per Day/30 Days Discharge Instructions: double layer apply in clinic today. Apply in the morning and remove at night. Electronic Signature(s) Signed: 11/17/2022 3:49:14 PM By: Kalman Shan DO Cornacchia, Alizay C ([id number] Page 5 of 10 Entered By: Kalman Shan on 11/17/2022 14:57:23 -------------------------------------------------------------------------------- Problem List Details Patient Name: Date of Service: [name] C. 11/17/2022 2:00 PM Medical Record Number: [id number] Patient Account Number:  [id number] Date of Birth/Sex: Treating RN: Nov 28, 1960 (62 y.o. F) Primary Care Provider: Jenean Lindau Other Clinician: Referring Provider: Treating Provider/Extender: Elise Benne in Treatment: 36 Active Problems ICD-10 Encounter Code Description Active Date MDM Diagnosis L97.522 Non-pressure chronic ulcer of other part of left foot with fat layer exposed 03/05/2022 No Yes E11.621 Type 2 diabetes mellitus with foot ulcer 03/05/2022 No Yes E66.01 Morbid (severe) obesity due to excess  calories 03/05/2022 No Yes Inactive Problems Resolved Problems Electronic Signature(s) Signed: 11/17/2022 3:49:14 PM By: Kalman Shan DO Entered By: Kalman Shan on 11/17/2022 14:55:27 -------------------------------------------------------------------------------- Progress Note Details Patient Name: Date of Service: HUFFMA Delane Ginger, [name]. 11/17/2022 2:00 PM Medical Record Number: [id number] Patient Account Number: [id number] Date of Birth/Sex: Treating RN: 12-28-1960 (62 y.o. F) Primary Care Provider: Jenean Lindau Other Clinician: Referring Provider: Treating Provider/Extender: Elise Benne in Treatment: 36 Subjective Chief Complaint Information obtained from Patient 03/05/2022; left foot wound History of Present Illness (HPI) Admission 03/05/2022 Ms. Rosey Vink is a 62 year old female with a past medical history of uncontrolled insulin-dependent type 2 diabetes, tobacco user and chronic diastolic heart failure that presents to the clinic for a 4-monthhistory of wound to her left heel. She states she had a left ankle fusion in September 2022. She states that she always had a wound after the surgery and it never healed. She has home health and they have been doing compression wraps along with silver alginate to the wound bed. She currently denies signs of infection. Governale, Oluwateniola C ([id number] Page 6  of 10 6/8; this is a 62year old woman with type 2 diabetes. She developed a wound on her left Achilles heel just above the tip of the heel in the setting of recurrent ankle surgeries in late 2022. I have seen some of these results from either the cast or the surgical boots that are put on after these operations. She thinks this may be the case. And a sense of pressure ulcer. In any case that she has been using Santyl Hydrofera Blue under compression. She is not wearing any footwear at home as she cannot find anything to accommodate the wrap 6/22; patient presents for follow-up. She has home health that comes out once a week to help with dressing changes. She has no issues or complaints today. We have been using Hydrofera Blue under compression therapy. 7/6; patient presents for follow-up. She states that home health did not come out for the past 2 weeks. It is unclear why. We have been using Hydrofera Blue and Santyl under compression therapy. 7/13; patient presents for follow-up. She did not take the oral antibiotics prescribed at last clinic visit. We have been using Hydrofera Blue with gentamicin/mupirocin ointment under compression therapy. She has no issues or complaints today. She denies signs of infection. 7/20; patient presents for follow-up. We have been using Hydrofera Blue with antibiotic ointment under 3 layer compression. She has home health that change the dressing once. They put collagen on the wound bed. She also reports falling yesterday and hitting her right foot. She has no pain to the area today. 7/27; patient presents for follow-up. We have been using Hydrofera Blue under 3 layer compression. She has home health that changes the dressing. She has no issues or complaints today. 8/3; patient presents for follow-up. We continues to use Hydrofera Blue under 3 layer compression. She has no issues or complaints today. She has been approved for Epicord at 100%. 8/11; patient presents  for follow-up. We have been using Hydrofera Blue under 3 layer compression. She has been approved for Epicord and we have this today. She is in agreement with having this applied. 8/18; patient presents for follow-up. Epicord #1 was placed in standard fashion last clinic visit. She has no issues or complaints today. 9/18; Unfortunately patient has missed her last clinic appointments due to falling and breaking her right ankle. We have been following her  for her left ankle wound. She has also developed a large blister to the left heel over the past week that has ruptured and dried. She currently resides In [facility]. Wound9/25; patient presents for follow-up. We have been using Hydrofera Blue and Santyl to the original left ankle wound and Xeroform to the dried blistered. We have been wrapping her with compression therapy. She resides in a facility and they changed the wrap once this past week. She has no issues or complaints today. She denies signs of infection. 10/3; patient presents for follow-up. We have been using Xeroform to the heel wound and Epicort to the left ankle wound All under compression therapy. She has no issues or complaints today. 10/10; patient presents for follow-up. We have been using Epicort to the left ankle wound and Santyl and Hydrofera Blue to the heel wound. All under compression therapy. she has no issues or complaints today. 10/16; patient presents for follow-up. Epicort was placed in standard fashion to the superior wound and onto the heel and Santyl and Hydrofera Blue. She states that the wrap got wet during her shower and her facility was able to rewrap with Kerlix/Coban. 10/23; patient presents for follow-up. Epicord was placed to the wound beds last clinic visit. We continue Kerlix/Coban. She has no issues or complaints today. 10/31; Patient presents for follow-up. Epicord was placed to the wound beds last clinic visit. The original wound is almost healed. We  have done this under Kerlix/Coban. She denies signs of infection. 11/10; patient presents for follow-up. Patient's last epi cord was placed in standard fashion at last clinic visit. The original wound has healed. She still has a heel wound. Grafix was approved however too costly for the patient. We will rerun epi cord to see if she can have more applications. She had done very well with this. We have been using Hydrofera Blue to the heel under Kerlix/Coban. 11/21; patient presents for follow-up. We have been using Hydrofera Blue and Santyl to the heel under Kerlix/Coban. She switched to a new healthcare insurance and again The skin substitute is too costly for the patient. She denies signs of infection. 11/28; patient presents for follow-up. Her facility did not change the wrap last clinic visit due to lack of staffing. The wrap was taken off and Hydrofera Blue dressing has been in place for the past week. She has no issues or complaints today. 12/5; patient presents for follow-up. She states she has had more drainage over the past week. They did not change the wrap at her facility. She states the nurse will be back this week. She is also been doing a lot of physical therapy. She has been using the Prevalon boot while in bed. 12/12; patient presents for follow-up. We have been using Hydrofera Blue with antibiotic ointment under 4-layer compression. She is tolerated the wrap well. She states she is using her Prevalon boot while in the wheelchair. She has no issues or complaints today. There is been improvement in wound healing. 12/19; patient presents for follow-up. We have been using Hydrofera Blue and antibiotic ointment to the wound bed under 4-layer compression. Her facility change the wrap once. She states she has been using the Prevalon boot in bed but not in the wheelchair due to issues with transferring. Overall there is still improvement in wound healing. 1/16; patient presents for follow-up.  She missed her last clinic appointment. We have been using Hydrofera Blue and antibiotic ointment under 4-layer compression. At the facility this has  only been changed twice over the past 3 weeks. They are not using 4-layer compression. She came in with Kerlix/Coban. 1/23; patient presents for follow-up. We have been using Santyl and Hydrofera Blue under 4-layer compression. Patient has no issues or complaints today. 2/1; patient presents for follow-up. Patient's been using Santyl and Hydrofera Blue to the wound bed. She has developed more slough and now a mild odor. She states she is wearing the Prevalon boot at night. It is unclear if she is offloading this area during the day. 2/13; patient presents for follow-up. Patient's been using Dakin's wet-to-dry dressings. She received her Keystone antibiotic ointment in the mail. She has not used this yet. She reports using her Prevalon boot. She has no issues or complaints today. Patient History Information obtained from Patient, Chart. Family History Cancer - Father,Mother, Diabetes - Mother,Maternal Grandparents,Paternal Grandparents, Heart Disease - Siblings,Maternal Grandparents, Hypertension - Siblings, Tuberculosis - Maternal Grandparents,Paternal Grandparents. Social History Current every day smoker, Marital Status - Single, Alcohol Use - Never, Drug Use - No History, Caffeine Use - Daily. 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 Vitals Time Taken: 2:08 PM, Height: 66 in, Weight: 339 lbs, BMI: 54.7, Temperature: 98.5 F, Pulse: 102 bpm, Respiratory Rate: 17 breaths/min, Blood Pressure: 136/75 mmHg, Capillary Blood Glucose: 136 mg/dl. Cardiovascular 2+ dorsalis pedis/posterior tibialis pulses. Psychiatric pleasant and cooperative. General Notes: Left foot: T the heel there is an open wound with granulation tissue and nonviable tissue to the center. No surrounding signs of soft tissue o infection. Integumentary (Hair, Skin) Wound #2 status is Open. Original cause of wound was Blister. The date acquired was: 06/15/2022. The wound has been in treatment 20 weeks. The wound is located on the Left,Distal Calcaneus. The wound measures 3cm length x 5cm width x 0.1cm depth; 11.781cm^2 area and 1.178cm^3 volume. There is Fat Layer (Subcutaneous Tissue) exposed. There is no tunneling or undermining noted. There is a medium amount of serosanguineous drainage noted. Foul odor after cleansing was noted. The wound margin is distinct with the outline attached to the wound base. There is large (67-100%) red, pink granulation within the wound bed. There is a small (1-33%) amount of necrotic tissue within the wound bed including Adherent Slough. The periwound skin appearance had no abnormalities noted for texture. The periwound skin appearance had no abnormalities noted for color. The periwound skin appearance did not exhibit: Dry/Scaly, Maceration. Periwound temperature was noted as No Abnormality. Assessment Active Problems ICD-10 Non-pressure chronic ulcer of other part of left foot with fat layer exposed Type 2 diabetes mellitus with foot ulcer Morbid (severe) obesity due to excess calories Patient's wound continues to get  larger. It does not appear that she is able to aggressively offload this area. At this time I recommended doing a total contact cast. Patient was agreeable to this. We will do this at next clinic visit. I debrided nonviable tissue. For now I recommended doing the [facility] antibiotic ointment and Hydrofera Blue along with her Prevalon  boot. She knows not to transfer in the boot. Follow-up in 1 week. 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 There was a Selective/Open Wound Skin/Epidermis Debridement with a total area of 15 sq cm performed by Kalman Shan, DO. With the following instrument(s): Curette to remove Viable and Non-Viable tissue/material. Material removed includes Slough, Skin: Dermis, and Skin: Epidermis after achieving pain control using Lidocaine. No specimens were taken. A time out was conducted at 14:25, prior to the start of the procedure. A Minimum amount of bleeding was controlled with Pressure. The procedure was tolerated well with a pain level of 0 throughout and a pain level of 0 following the procedure. Post Debridement Measurements: 3cm length x 5cm width x 0.1cm depth; 1.178cm^3 volume. Scriven, Floris C ([id number]) [id number] Page 8 of 10 Character of Wound/Ulcer Post Debridement is improved. Severity of Tissue Post Debridement is: Fat layer exposed. Post procedure Diagnosis Wound #2: Same as Pre-Procedure Plan Follow-up Appointments: Return Appointment in 1 week. - Dr. Heber [place] and [place] Rm # 8 Tuesday 11/24/22 @ 11:00 (already has appt.) AND Thursday 11/26/22 @ 11:00 Rm # 9 ***Plan for TCC***EXTRA TIME*** Return Appointment in 2 weeks. - Dr. Heber [place] and Allayne Butcher Rm # 9 Tuesday 12/01/22 @ 10:15 Other: - Patient to use more prevalon boot in bed and wheelchair. Do not transfer when in  wheelchair. ****facility to change dressing daily.**** Anesthetic: (In clinic) Topical Lidocaine 5% applied to wound bed Cellular or Tissue Based Products: Cellular or Tissue Based Product Type: - Run IVR for EpiFix and Epicord=100% covered 05/07/22: Epicord ordered Cellular or Tissue Based Product applied to wound bed, secured with steri-strips, cover with Adaptic or Mepitel. (DO NOT REMOVE). - Epicord #1 05/15/25 Epicord #2 05/22/22 Epicord #3 06/29/2022 Epicord #4 07/07/2022 Epifix #5 07/14/22 Epicord #6 07/20/22 Epicord # 7 07/27/22 ***Run for Grafix***PENDING Epicord #8 08/04/2022 Edema Control - Lymphedema / SCD / Other: Elevate legs to the level of the heart or above for 30 minutes daily and/or when sitting for 3-4 times a day throughout the day. Avoid standing for long periods of time. Off-Loading: Prevalon Boot - use while in bed and chair. Additional Orders / Instructions: Follow Nutritious Diet - Monitor/Control Blood Sugar WOUND #2: - Calcaneus Wound Laterality: Left, Distal Cleanser: Soap and Water 1 x Per Day/30 Days Discharge Instructions: May shower and wash wound with dial antibacterial soap and water prior to dressing change. Cleanser: Wound Cleanser 1 x Per Day/30 Days Discharge Instructions: Cleanse the wound with wound cleanser prior to applying a clean dressing using gauze sponges, not tissue or cotton balls. Peri-Wound Care: Zinc Oxide Ointment 30g tube 1 x Per Day/30 Days Discharge Instructions: Apply Zinc Oxide to periwound with each dressing change Peri-Wound Care: Sween Lotion (Moisturizing lotion) 1 x Per Day/30 Days Discharge Instructions: Apply moisturizing lotion as directed Topical: Keystone 1 x Per Day/30 Days Prim Dressing: Hydrofera Blue Ready Transfer Foam, 8x8 (in/in) 1 x Per Day/30 Days ary Discharge Instructions: Apply to wound bed as instructed Secondary Dressing: ABD Pad, 8x10 1 x Per Day/30 Days Discharge Instructions: Apply over primary dressing as  directed. Secondary Dressing: Woven Gauze Sponge, Non-Sterile 4x4 in (Home Health) 1 x Per Day/30 Days Discharge Instructions: Apply over primary dressing as directed. Secured With: [employer], 4.5x3.1 (in/yd) 1 x Per Day/30 Days Discharge Instructions: Secure with Kerlix as directed. Com pression Wrap:  tubigrip size E 1 x Per Day/30 Days Discharge Instructions: double layer apply in clinic today. Apply in the morning and remove at night. 1. In office sharp debridement 2. Hydrofera Blue and Keystone antibiotic ointment 3. Aggressive offloadingooPrevalon boot 4. Follow-up in 1 weekooplan for total contact cast placement Electronic Signature(s) Signed: 11/17/2022 3:49:14 PM By: Kalman Shan DO Entered By: Kalman Shan on 11/17/2022 14:58:51 -------------------------------------------------------------------------------- HxROS Details Patient Name: Date of Service: [name], [name]. 11/17/2022 2:00 PM Medical Record Number: [id number] Patient Account Number: [id number] Date of Birth/Sex: Treating RN: [birth year] (62 y.o. F) Primary Care Provider: Jenean Lindau Other Clinician: Referring Provider: Treating Provider/Extender: Elise Benne in Treatment: 36 Information Obtained From Patient Chart Hematologic/Lymphatic Radick, Rosangela C ([id number]) [id number] Page 9 of 10 Medical History: Positive for: Anemia Respiratory Medical History: Positive for: Chronic Obstructive Pulmonary Disease (COPD) Cardiovascular Medical History: Positive for: Congestive Heart Failure - Weighs self daily; Hypertension; Myocardial Infarction; Peripheral Venous Disease Gastrointestinal Medical History: Past Medical History Notes: GERD, Peptic Ulcer Disease Endocrine Medical History: Positive for: Type II Diabetes Time with diabetes: 16 years Treated with: Insulin, Oral agents Blood sugar tested every day: Yes Tested : Twice  daily Musculoskeletal Medical History: Past Medical History Notes: Broke left leg 3 years ago, 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, foot fused Neurologic Medical History: Positive for: Neuropathy Psychiatric Medical History: Past Medical History Notes: Depression Immunizations Pneumococcal Vaccine: Received Pneumococcal Vaccination: No Implantable Devices None Family and Social History Cancer: Yes - Father,Mother; Diabetes: Yes - Mother,Maternal Grandparents,Paternal Grandparents; Heart Disease: Yes - Siblings,Maternal Grandparents; Hypertension: Yes - Siblings; Tuberculosis: Yes - Maternal Grandparents,Paternal Grandparents; Current every day smoker; Marital Status - Single; Alcohol Use: Never; Drug Use: No History; Caffeine Use: Daily; Financial Concerns: No; Food, Clothing or Shelter Needs: No; Support System Lacking: No; Transportation Concerns: No Electronic Signature(s) Signed: 11/17/2022 3:49:14 PM By: Kalman Shan DO Entered By: Kalman Shan on 11/17/2022 14:56:47 -------------------------------------------------------------------------------- SuperBill Details Patient Name: Date of Service: [name] C. 11/17/2022 Medical Record Number: [id number] Patient Account Number: [id number] Date of Birth/Sex: Treating RN: 04/19/1961 (62 y.o. [name], [name] Primary Care Provider: Jenean Lindau Other Clinician: Burney Gauze, Besan C ([id number]) [id number] Page 10 of 10 Referring Provider: Treating Provider/Extender: Elise Benne in Treatment: 36 Diagnosis Coding ICD-10 Codes Code Description [phone] Non-pressure chronic ulcer of other part of left foot with fat layer exposed E11.621 Type 2 diabetes mellitus with foot ulcer E66.01 Morbid (severe) obesity due to excess calories Facility Procedures : CPT4 Code: [id number] Description: [id number] - DEBRIDE WOUND 1ST 20 SQ CM OR < ICD-10 Diagnosis Description L97.522  Non-pressure chronic ulcer of other part of left foot with fat layer exposed E11.621 Type 2 diabetes mellitus with foot ulcer Modifier: Quantity: 1 Physician Procedures : CPT4 Code Description Modifier [id number] - WC PHYS DEBR WO ANESTH 20 SQ CM ICD-10 Diagnosis Description L97.522 Non-pressure chronic ulcer of other part of left foot with fat layer exposed E11.621 Type 2 diabetes mellitus with foot ulcer Quantity: 1 Electronic Signature(s) Signed: 11/17/2022 3:49:14 PM By: Kalman Shan DO Entered By: Kalman Shan on 11/17/2022 15:00:02

## 2022-11-23 NOTE — Progress Notes (Signed)
Bracken, [name] ([id number]) [id number] Page 1 of 8 Visit Report for 10/20/2022 Arrival Information Details Patient Name: Date of Service: [name]. 10/20/2022 3:00 PM Medical Record Number: [id number] Patient Account Number: [id number] Date of Birth/Sex: Treating RN: 04/01/1961 (62 y.o. [name], [name] Primary Care Kiven Vangilder: Jenean Lindau Other Clinician: Referring Adit Riddles: Treating Crystalmarie Yasin/Extender: Elise Benne in Treatment: 33 Visit Information History Since Last Visit Added or deleted any medications: No Patient Arrived: Wheel Chair Any new allergies or adverse reactions: No Arrival Time: 14:52 Had a fall or experienced change in No Accompanied By: self activities of daily living that may affect Transfer Assistance: Manual risk of falls: Patient Identification Verified: Yes Signs or symptoms of abuse/neglect since last visito No Secondary Verification Process Completed: Yes Hospitalized since last visit: No Patient Requires Transmission-Based Precautions: No Implantable device outside of the clinic excluding No Patient Has Alerts: No cellular tissue based products placed in the center since last visit: Has Dressing in Place as Prescribed: Yes Has Compression in Place as Prescribed: Yes Pain Present Now: Yes Electronic Signature(s) Signed: 11/23/2022 10:40:58 AM By: Rhae Hammock RN Entered By: Rhae Hammock on 10/20/2022 14:52:49 -------------------------------------------------------------------------------- Compression Therapy Details Patient Name: Date of Service: [name], [name]. 10/20/2022 3:00 PM Medical Record Number: [id number] Patient Account Number: [id number] Date of Birth/Sex: Treating RN: Nov 16, 1960 (62 y.o. [name], [name] Primary Care Fadel Clason: Jenean Lindau Other Clinician: Referring Naidelin Gugliotta: Treating Le Faulcon/Extender: Elise Benne in  Treatment: 32 Compression Therapy Performed for Wound Assessment: Wound #2 Left,Distal Calcaneus Performed By: Clinician Rhae Hammock, RN Compression Type: Four Layer Post Procedure Diagnosis Same as Pre-procedure Electronic Signature(s) Signed: 11/23/2022 10:40:58 AM By: Rhae Hammock RN Entered By: Rhae Hammock on 10/20/2022 15:04:45 [name] ([id number]) [id number] Page 2 of 8 -------------------------------------------------------------------------------- Encounter Discharge Information Details Patient Name: Date of Service: [name]. 10/20/2022 3:00 PM Medical Record Number: [id number] Patient Account Number: [id number] Date of Birth/Sex: Treating RN: [birth year] (62 y.o. [name], [name] Primary Care Tonia Avino: Jenean Lindau Other Clinician: Referring Buford Gayler: Treating Sydnie Sigmund/Extender: Elise Benne in Treatment: 32 Encounter Discharge Information Items Discharge Condition: Stable Ambulatory Status: Wheelchair Discharge Destination: Home Transportation: Private Auto Accompanied By: self Schedule Follow-up Appointment: Yes Clinical Summary of Care: Patient Declined Electronic Signature(s) Signed: 11/23/2022 10:40:58 AM By: Rhae Hammock RN Entered By: Rhae Hammock on 10/20/2022 15:05:20 -------------------------------------------------------------------------------- Lower Extremity Assessment Details Patient Name: Date of Service: [name] Parcel MY [name]. 10/20/2022 3:00 PM Medical Record Number: [id number] Patient Account Number: [id number] Date of Birth/Sex: Treating RN: 1961/04/17 (62 y.o. [name], [name] Primary Care Clarann Helvey: Jenean Lindau Other Clinician: Referring Margeret Stachnik: Treating Darvin Dials/Extender: Elise Benne in Treatment: 32 Edema Assessment Assessed: Shirlyn Goltz: Yes] Patrice Paradise: No] Edema: [Left: Ye] [Right: s] Calf Left: Right: Point  of Measurement: 28 cm From Medial Instep 50.5 cm Ankle Left: Right: Point of Measurement: 3 cm From Medial Instep 30.5 cm Vascular Assessment Pulses: Dorsalis Pedis Palpable: [Left:Yes] Posterior Tibial Palpable: [Left:Yes] Electronic Signature(s) Signed: 11/23/2022 10:40:58 AM By: Rhae Hammock RN Entered By: Rhae Hammock on 10/20/2022 15:02:07 Perry, [name] ([id number]) [id number] Page 3 of 8 -------------------------------------------------------------------------------- Multi Wound Chart Details Patient Name: Date of Service: [name]. 10/20/2022 3:00 PM Medical Record Number: [id number] Patient Account Number: [id number] Date of Birth/Sex: Treating RN: 04/28/1961 (62 y.o. F) Primary Care Alessandro Griep: Jenean Lindau Other Clinician: Referring Tylik Treese: Treating Kaiyana Bedore/Extender: Elise Benne in Treatment: 32 Vital  Signs Height(in): 66 Pulse(bpm): 75 Weight(lbs): 339 Blood Pressure(mmHg): 123/80 Body Mass Index(BMI): 54.7 Temperature(F): 98.1 Respiratory Rate(breaths/min): 17 [2:Photos:] [N/A:N/A] Left, Distal Calcaneus N/A N/A Wound Location: Blister N/A N/A Wounding Event: Diabetic Wound/Ulcer of the Lower N/A N/A Primary Etiology: Extremity Anemia, Chronic Obstructive N/A N/A Comorbid History: Pulmonary Disease (COPD), Congestive Heart Failure, Hypertension, Myocardial Infarction, Peripheral Venous Disease, Type II Diabetes, Neuropathy 06/15/2022 N/A N/A Date Acquired: 16 N/A N/A Weeks of Treatment: Open N/A N/A Wound Status: No N/A N/A Wound Recurrence: 2.3x3.5x0.1 N/A N/A Measurements L x W x D (cm) 6.322 N/A N/A A (cm) : rea 0.632 N/A N/A Volume (cm) : 55.30% N/A N/A % Reduction in A rea: 55.30% N/A N/A % Reduction in Volume: Grade 2 N/A N/A Classification: Medium N/A N/A Exudate A mount: Serosanguineous N/A N/A Exudate Type: red, brown N/A N/A Exudate  Color: Distinct, outline attached N/A N/A Wound Margin: Large (67-100%) N/A N/A Granulation A mount: Red, Pink N/A N/A Granulation Quality: Small (1-33%) N/A N/A Necrotic A mount: Fat Layer (Subcutaneous Tissue): Yes N/A N/A Exposed Structures: Fascia: No Tendon: No Muscle: No Joint: No Bone: No Medium (34-66%) N/A N/A Epithelialization: No Abnormalities Noted N/A N/A Periwound Skin Texture: Maceration: No N/A N/A Periwound Skin Moisture: Dry/Scaly: No No Abnormalities Noted N/A N/A Periwound Skin Color: No Abnormality N/A N/A Temperature: Compression Therapy N/A N/A Procedures Performed: Treatment Notes Wound #2 (Calcaneus) Wound Laterality: Left, Distal Cleanser Soap and Water Discharge Instruction: May shower and wash wound with dial antibacterial soap and water prior to dressing change. Wound Cleanser Discharge Instruction: Cleanse the wound with wound cleanser prior to applying a clean dressing using gauze sponges, not tissue or cotton balls. Kuenzel, Alice [name] ([id number]) [id number] Page 4 of 8 Peri-Wound Care Sween Lotion (Moisturizing lotion) Discharge Instruction: Apply moisturizing lotion as directed Topical Gentamicin Discharge Instruction: apply only in clinic directly to wound bed. Mupirocin Ointment Discharge Instruction: apply only in clinic directly to wound bed. Primary Dressing Hydrofera Blue Ready Foam, 2.5 x2.5 in Discharge Instruction: apply over the santyl. Secondary Dressing ABD Pad, 8x10 Discharge Instruction: Apply over primary dressing as directed. Woven Gauze Sponge, Non-Sterile 4x4 in Discharge Instruction: Apply over primary dressing as directed. Secured With Compression Wrap FourPress (4 layer compression wrap) Discharge Instruction: Apply four layer compression as directed. May also use Miliken CoFlex 2 layer compression system as alternative. Compression Stockings Add-Ons Electronic Signature(s) Signed:  10/20/2022 3:36:41 PM By: Kalman Shan DO Entered By: Kalman Shan on 10/20/2022 15:19:08 -------------------------------------------------------------------------------- Multi-Disciplinary Care Plan Details Patient Name: Date of Service: [name]. 10/20/2022 3:00 PM Medical Record Number: [id number] Patient Account Number: [id number] Date of Birth/Sex: Treating RN: Mar 29, 1961 (62 y.o. [name], [name] Primary Care Kellene Mccleary: Jenean Lindau Other Clinician: Referring Wandra Babin: Treating Amaia Lavallie/Extender: Elise Benne in Treatment: 60 Active Inactive Abuse / Safety / Falls / Self Care Management Nursing Diagnoses: History of Falls Goals: Patient will not experience any injury related to falls Date Initiated: 03/05/2022 Target Resolution Date: 11/06/2022 Goal Status: Active Interventions: Assess fall risk on admission and as needed Assess: immobility, friction, shearing, incontinence upon admission and as needed Assess impairment of mobility on admission and as needed per policy Notes: [id number]: Fall prevention ongoing, recent fall. Wound/Skin Impairment Archambault, Mionna [name] ([id number]) [id number] Page 5 of 8 Nursing Diagnoses: Impaired tissue integrity Goals: Patient/caregiver will verbalize understanding of skin care regimen Date Initiated: 03/05/2022 Target Resolution Date: 11/06/2022 Goal Status: Active Ulcer/skin breakdown will have a volume reduction of 30% by  week 4 Date Initiated: 03/05/2022 Date Inactivated: 04/30/2022 Target Resolution Date: 05/02/2022 Goal Status: Met Ulcer/skin breakdown will have a volume reduction of 50% by week 8 Date Initiated: 04/30/2022 Date Inactivated: 07/07/2022 Target Resolution Date: 07/04/2022 Unmet Reason: patient sent three Goal Status: Unmet weeks inpatient and rehab before returning to wound center. Interventions: Assess patient/caregiver ability to obtain necessary  supplies Assess patient/caregiver ability to perform ulcer/skin care regimen upon admission and as needed Assess ulceration(s) every visit Provide education on smoking Provide education on ulcer and skin care Treatment Activities: Topical wound management initiated : 03/05/2022 Notes: 04/30/22: Wound care regimen continues. Electronic Signature(s) Signed: 11/23/2022 10:40:58 AM By: Rhae Hammock RN Entered By: Rhae Hammock on 10/20/2022 15:02:40 -------------------------------------------------------------------------------- Pain Assessment Details Patient Name: Date of Service: [name] MY [name]. 10/20/2022 3:00 PM Medical Record Number: [id number] Patient Account Number: [id number] Date of Birth/Sex: Treating RN: 09-02-1961 (62 y.o. [name], [name] Primary Care Emile Kyllo: Jenean Lindau Other Clinician: Referring Emmette Katt: Treating Danett Palazzo/Extender: Elise Benne in Treatment: 32 Active Problems Location of Pain Severity and Description of Pain Patient Has Paino Yes Site Locations With Dressing Change: Yes Duration of the Pain. Constant / Intermittento Intermittent Rate the pain. Current Pain Level: 5 Worst Pain Level: 10 Least Pain Level: 0 Tolerable Pain Level: 5 Character of Pain Describe the Pain: Aching Aldous, Juley [name] ([id number]) [id number] Page 6 of 8 Pain Management and Medication Current Pain Management: Medication: No Cold Application: No Rest: No Massage: No Activity: No T.E.N.S.: No Heat Application: No Leg drop or elevation: No Is the Current Pain Management Adequate: Adequate How does your wound impact your activities of daily livingo Sleep: No Bathing: No Appetite: No Relationship With Others: No Bladder Continence: No Emotions: No Bowel Continence: No Work: No Toileting: No Drive: No Dressing: No Hobbies: No Electronic Signature(s) Signed: 11/23/2022 10:40:58 AM By:  Rhae Hammock RN Entered By: Rhae Hammock on 10/20/2022 14:52:23 -------------------------------------------------------------------------------- Patient/Caregiver Education Details Patient Name: Date of Service: [name] 1/16/2024andnbsp3:00 PM Medical Record Number: [id number] Patient Account Number: [id number] Date of Birth/Gender: Treating RN: Jan 12, 1961 (62 y.o. [name], [name] Primary Care Physician: Jenean Lindau Other Clinician: Referring Physician: Treating Physician/Extender: Elise Benne in Treatment: 53 Education Assessment Education Provided To: Patient Education Topics Provided Wound/Skin Impairment: Methods: Explain/Verbal Responses: Reinforcements needed, State content correctly [employer]) Signed: 11/23/2022 10:40:58 AM By: Rhae Hammock RN Entered By: Rhae Hammock on 10/20/2022 15:04:14 -------------------------------------------------------------------------------- Wound Assessment Details Patient Name: Date of Service: [name]. 10/20/2022 3:00 PM Medical Record Number: [id number] Patient Account Number: [id number] Date of Birth/Sex: Treating RN: 06-Jun-1961 (62 y.o. [name], [name] Primary Care Toryn Dewalt: Jenean Lindau Other Clinician: Referring Meegan Shanafelt: Treating Yannis Gumbs/Extender: Elise Benne in Treatment: 32 Wound Status Abts, Money [name] ([id number]) [phone].pdf Page 7 of 8 Wound Number: 2 Primary Diabetic Wound/Ulcer of the Lower Extremity Etiology: Wound Location: Left, Distal Calcaneus Wound Open Wounding Event: Blister Status: Date Acquired: 06/15/2022 Comorbid Anemia, Chronic Obstructive Pulmonary Disease (COPD), Weeks Of Treatment: 16 History: Congestive Heart Failure, Hypertension, Myocardial Infarction, Clustered Wound: No Peripheral Venous Disease, Type II Diabetes, Neuropathy Photos Wound  Measurements Length: (cm) 2.3 Width: (cm) 3.5 Depth: (cm) 0.1 Area: (cm) 6.322 Volume: (cm) 0.632 % Reduction in Area: 55.3% % Reduction in Volume: 55.3% Epithelialization: Medium (34-66%) Wound Description Classification: Grade 2 Wound Margin: Distinct, outline attached Exudate Amount: Medium Exudate Type: Serosanguineous Exudate Color: red, brown Foul Odor After Cleansing: No Slough/Fibrino Yes Wound  Bed Granulation Amount: Large (67-100%) Exposed Structure Granulation Quality: Red, Pink Fascia Exposed: No Necrotic Amount: Small (1-33%) Fat Layer (Subcutaneous Tissue) Exposed: Yes Necrotic Quality: Adherent Slough Tendon Exposed: No Muscle Exposed: No Joint Exposed: No Bone Exposed: No Periwound Skin Texture Texture Color No Abnormalities Noted: Yes No Abnormalities Noted: Yes Moisture Temperature / Pain No Abnormalities Noted: No Temperature: No Abnormality Dry / Scaly: No Maceration: No Electronic Signature(s) Signed: 11/23/2022 10:40:58 AM By: Rhae Hammock RN Entered By: Rhae Hammock on 10/20/2022 14:59:21 -------------------------------------------------------------------------------- Vitals Details Patient Name: Date of Service: [name], [name]. 10/20/2022 3:00 PM Medical Record Number: [id number] Patient Account Number: [id number] Date of Birth/Sex: Treating RN: 1961/01/21 (62 y.o. [name], [name] Primary Care Stepanie Graver: Jenean Lindau Other Clinician: Referring Purl Claytor: Treating Allysia Ingles/Extender: Elise Benne in Treatment: [address], [place] [name] ([id number]) [id number] Page 8 of 8 Vital Signs Time Taken: 14:52 Temperature (F): 98.1 Height (in): 66 Pulse (bpm): 75 Weight (lbs): 339 Respiratory Rate (breaths/min): 17 Body Mass Index (BMI): 54.7 Blood Pressure (mmHg): 123/80 Reference Range: 80 - 120 mg / dl Electronic Signature(s) Signed: 11/23/2022 10:40:58 AM By: Rhae Hammock RN Entered By: Rhae Hammock on 10/20/2022 14:53:10

## 2022-11-24 ENCOUNTER — Encounter (HOSPITAL_BASED_OUTPATIENT_CLINIC_OR_DEPARTMENT_OTHER): Payer: 59 | Admitting: Internal Medicine

## 2022-11-24 DIAGNOSIS — E11621 Type 2 diabetes mellitus with foot ulcer: Secondary | ICD-10-CM | POA: Diagnosis not present

## 2022-11-24 DIAGNOSIS — L97522 Non-pressure chronic ulcer of other part of left foot with fat layer exposed: Secondary | ICD-10-CM | POA: Diagnosis not present

## 2022-11-25 NOTE — Progress Notes (Addendum)
[name] ([id number]) [id number] Page 1 of 10 Visit Report for 11/24/2022 Chief Complaint Document Details Patient Name: Date of Service: [name]. 11/24/2022 11:00 [name] Medical Record Number: [id number] Patient Account Number: [id number] Date of Birth/Sex: Treating RN: April 17, 1961 (62 y.o. F) Primary Care Provider: Jenean Lindau Other Clinician: Referring Provider: Treating Provider/Extender: Elise Benne in Treatment: 103 Information Obtained from: Patient Chief Complaint 03/05/2022; left foot wound Electronic Signature(s) Signed: 11/24/2022 12:37:22 PM By: Kalman Shan DO Entered By: Kalman Shan on 11/24/2022 11:29:06 -------------------------------------------------------------------------------- Debridement Details Patient Name: Date of Service: [name]. 11/24/2022 11:00 [name] Medical Record Number: [id number] Patient Account Number: [id number] Date of Birth/Sex: Treating RN: December 16, 1960 (62 y.o. [name] Primary Care Provider: Jenean Lindau Other Clinician: Referring Provider: Treating Provider/Extender: Elise Benne in Treatment: 37 Debridement Performed for Assessment: Wound #2 Left,Distal Calcaneus Performed By: Physician Kalman Shan, DO Debridement Type: Debridement Severity of Tissue Pre Debridement: Fat layer exposed Level of Consciousness (Pre-procedure): Awake and Alert Pre-procedure Verification/Time Out Yes - 11:00 Taken: Start Time: 11:01 Pain Control: Lidocaine 4% T opical Solution T Area Debrided (L x W): otal 3.2 (cm) x 4.8 (cm) = 15.36 (cm) Tissue and other material debrided: Viable, Non-Viable, Slough, Subcutaneous, Skin: Dermis , Skin: Epidermis, Slough Level: Skin/Subcutaneous Tissue Debridement Description: Excisional Instrument: Curette Bleeding: Minimum Hemostasis Achieved: Pressure End Time: 11:04 Procedural Pain:  0 Post Procedural Pain: 0 Response to Treatment: Procedure was tolerated well Level of Consciousness (Post- Awake and Alert procedure): Post Debridement Measurements of Total Wound Length: (cm) 3.2 Width: (cm) 4.8 Depth: (cm) 0.1 Volume: (cm) 1.206 Character of Wound/Ulcer Post Debridement: Requires Further Debridement Severity of Tissue Post Debridement: Fat layer exposed Post Procedure Diagnosis Same as Pre-procedure Electronic Signature(s) Signed: 11/24/2022 12:37:22 PM By: Kalman Shan DO Signed: 11/24/2022 4:50:49 PM By: Deon Pilling RN, BSN Entered By: Deon Pilling on 11/24/2022 11:05:33 Kehoe, [name] ([id number].pdf Page 2 of 10 -------------------------------------------------------------------------------- HPI Details Patient Name: Date of Service: [name]. 11/24/2022 11:00 [name] Medical Record Number: [id number] Patient Account Number: [id number] Date of Birth/Sex: Treating RN: 07-22-1961 (62 y.o. F) Primary Care Provider: Jenean Lindau Other Clinician: Referring Provider: Treating Provider/Extender: Elise Benne in Treatment: 55 History of Present Illness HPI Description: Admission 03/05/2022 [name] is [name] 62 year old female with [name] past medical history of uncontrolled insulin-dependent type 2 diabetes, tobacco user and chronic diastolic heart failure that presents to the clinic for [name] 38-monthhistory of wound to her left heel. She states she had [name] left ankle fusion in September 2022. She states that she always had [name] wound after the surgery and it never healed. She has home health and they have been doing compression wraps along with silver alginate to the wound bed. She currently denies signs of infection. 6/8; this is [name] 62year old woman with type 2 diabetes. She developed [name] wound on her left Achilles heel just above the tip of the heel in the setting of recurrent ankle surgeries in  late 2022. I have seen some of these results from either the cast or the surgical boots that are put on after these operations. She thinks this may be the case. And [name] sense of pressure ulcer. In any case that she has been using Santyl Hydrofera Blue under compression. She is not wearing any footwear at home as she cannot find anything to accommodate the wrap 6/22; patient presents for  follow-up. She has home health that comes out once [name] week to help with dressing changes. She has no issues or complaints today. We have been using Hydrofera Blue under compression therapy. 7/6; patient presents for follow-up. She states that home health did not come out for the past 2 weeks. It is unclear why. We have been using Hydrofera Blue and Santyl under compression therapy. 7/13; patient presents for follow-up. She did not take the oral antibiotics prescribed at last clinic visit. We have been using Hydrofera Blue with gentamicin/mupirocin ointment under compression therapy. She has no issues or complaints today. She denies signs of infection. 7/20; patient presents for follow-up. We have been using Hydrofera Blue with antibiotic ointment under 3 layer compression. She has home health that change the dressing once. They put collagen on the wound bed. She also reports falling yesterday and hitting her right foot. She has no pain to the area today. 7/27; patient presents for follow-up. We have been using Hydrofera Blue under 3 layer compression. She has home health that changes the dressing. She has no issues or complaints today. 8/3; patient presents for follow-up. We continues to use Hydrofera Blue under 3 layer compression. She has no issues or complaints today. She has been approved for Epicord at 100%. 8/11; patient presents for follow-up. We have been using Hydrofera Blue under 3 layer compression. She has been approved for Epicord and we have this today. She is in agreement with having this applied. 8/18;  patient presents for follow-up. Epicord #1 was placed in standard fashion last clinic visit. She has no issues or complaints today. 9/18; Unfortunately patient has missed her last clinic appointments due to falling and breaking her right ankle. We have been following her for her left ankle wound. She has also developed [name] large blister to the left heel over the past week that has ruptured and dried. She currently resides In [facility]. Wound9/25; patient presents for follow-up. We have been using Hydrofera Blue and Santyl to the original left ankle wound and Xeroform to the dried blistered. We have been wrapping her with compression therapy. She resides in [name] facility and they changed the wrap once this past week. She has no issues or complaints today. She denies signs of infection. 10/3; patient presents for follow-up. We have been using Xeroform to the heel wound and Epicort to the left ankle wound All under compression therapy. She has no issues or complaints today. 10/10; patient presents for follow-up. We have been using Epicort to the left ankle wound and Santyl and Hydrofera Blue to the heel wound. All under compression therapy. she has no issues or complaints today. 10/16; patient presents for follow-up. Epicort was placed in standard fashion to the superior wound and onto the heel and Santyl and Hydrofera Blue. She states that the wrap got wet during her shower and her facility was able to rewrap with Kerlix/Coban. 10/23; patient presents for follow-up. Epicord was placed to the wound beds last clinic visit. We continue Kerlix/Coban. She has no issues or complaints today. 10/31; Patient presents for follow-up. Epicord was placed to the wound beds last clinic visit. The original wound is almost healed. We have done this under Kerlix/Coban. She denies signs of infection. 11/10; patient presents for follow-up. Patient's last epi cord was placed in standard fashion at last clinic visit. The  original wound has healed. She still has [name] heel wound. Grafix was approved however too costly for the patient. We will rerun epi cord to  see if she can have more applications. She had done very well with this. We have been using Hydrofera Blue to the heel under Kerlix/Coban. 11/21; patient presents for follow-up. We have been using Hydrofera Blue and Santyl to the heel under Kerlix/Coban. She switched to [name] new healthcare insurance and again The skin substitute is too costly for the patient. She denies signs of infection. 11/28; patient presents for follow-up. Her facility did not change the wrap last clinic visit due to lack of staffing. The wrap was taken off and Hydrofera Blue dressing has been in place for the past week. She has no issues or complaints today. 12/5; patient presents for follow-up. She states she has had more drainage over the past week. They did not change the wrap at her facility. She states the nurse will be back this week. She is also been doing [name] lot of physical therapy. She has been using the Prevalon boot while in bed. 12/12; patient presents for follow-up. We have been using Hydrofera Blue with antibiotic ointment under 4-layer compression. She is tolerated the wrap well. She states she is using her Prevalon boot while in the wheelchair. She has no issues or complaints today. There is been improvement in wound healing. 12/19; patient presents for follow-up. We have been using Hydrofera Blue and antibiotic ointment to the wound bed under 4-layer compression. Her facility change the wrap once. She states she has been using the Prevalon boot in bed but not in the wheelchair due to issues with transferring. Overall there is still Howells, Arasely [name] ([id number]) [phone].pdf Page 3 of 10 improvement in wound healing. 1/16; patient presents for follow-up. She missed her last clinic appointment. We have been using Hydrofera Blue and antibiotic ointment under  4-layer compression. At the facility this has only been changed twice over the past 3 weeks. They are not using 4-layer compression. She came in with Kerlix/Coban. 1/23; patient presents for follow-up. We have been using Santyl and Hydrofera Blue under 4-layer compression. Patient has no issues or complaints today. 2/1; patient presents for follow-up. Patient's been using Santyl and Hydrofera Blue to the wound bed. She has developed more slough and now [name] mild odor. She states she is wearing the Prevalon boot at night. It is unclear if she is offloading this area during the day. 2/13; patient presents for follow-up. Patient's been using Dakin's wet-to-dry dressings. She received her Keystone antibiotic ointment in the mail. She has not used this yet. She reports using her Prevalon boot. She has no issues or complaints today. 2/20; patient presents for follow-up. She has been using Keystone antibiotic ointment with Hydrofera Blue. She has no issues or complaints today. Due to transportation she cannot do the total contact cast today. She is scheduled for next week to have this placed. Electronic Signature(s) Signed: 11/24/2022 12:37:22 PM By: Kalman Shan DO Entered By: Kalman Shan on 11/24/2022 11:29:52 -------------------------------------------------------------------------------- Physical Exam Details Patient Name: Date of Service: [name]. 11/24/2022 11:00 [name] Medical Record Number: [id number] Patient Account Number: [id number] Date of Birth/Sex: Treating RN: [birth year] (62 y.o. F) Primary Care Provider: Jenean Lindau Other Clinician: Referring Provider: Treating Provider/Extender: Elise Benne in Treatment: 37 Constitutional respirations regular, non-labored and within target range for patient.. Cardiovascular 2+ dorsalis pedis/posterior tibialis pulses. Psychiatric pleasant and cooperative. Notes Left foot: T the heel there is an open  wound with granulation tissue and nonviable tissue. No surrounding signs of soft tissue infection. o Engineer, maintenance) Signed:  11/24/2022 12:37:22 PM By: Kalman Shan DO Entered By: Kalman Shan on 11/24/2022 11:30:30 -------------------------------------------------------------------------------- Physician Orders Details Patient Name: Date of Service: [name]. 11/24/2022 11:00 [name] Medical Record Number: [id number] Patient Account Number: [id number] Date of Birth/Sex: Treating RN: Nov 14, 1960 (62 y.o. [name] Primary Care Provider: Jenean Lindau Other Clinician: Referring Provider: Treating Provider/Extender: Elise Benne in Treatment: [phone] Verbal / Phone Orders: No Diagnosis Coding ICD-10 Coding Code Description [phone] Non-pressure chronic ulcer of other part of left foot with fat layer exposed E11.621 Type 2 diabetes mellitus with foot ulcer E66.01 Morbid (severe) obesity due to excess calories Follow-up Appointments ppointment in 1 week. - Dr. Heber  and Allayne Butcher Rm # 9 Tuesday 11/24/22 @ 1100 extra time plan for total contact cast Return [name] ppointment in 2 weeks. - 12/01/2022 cast change 0800 Return [name] Other: - Patient to use more prevalon boot in bed and wheelchair. Do not transfer when in wheelchair. ****facility to change dressing daily.**** Neyhart, [name]) [id number].pdf Page 4 of 10 Anesthetic (In clinic) Topical Lidocaine 5% applied to wound bed Cellular or Tissue Based Products Cellular or Tissue Based Product Type: - Run IVR for EpiFix and Epicord=100% covered 05/07/22: Epicord ordered daptic or Mepitel. (DO NOT REMOVE). - Cellular or Tissue Based Product applied to wound bed, secured with steri-strips, cover with [name] Epicord #1 05/15/25 Epicord #2 05/22/22 Epicord #3 06/29/2022 Epicord #4 07/07/2022 Epifix #5 07/14/22 Epicord #6 07/20/22 Epicord # 7 07/27/22 ***Run for  Grafix***PENDING Epicord #8 08/04/2022 Edema Control - Lymphedema / SCD / Other Elevate legs to the level of the heart or above for 30 minutes daily and/or when sitting for 3-4 times [name] day throughout the day. Avoid standing for long periods of time. Off-Loading Prevalon Boot - use while in bed and chair. Additional Orders / Instructions Follow Nutritious Diet - Monitor/Control Blood Sugar Wound Treatment Wound #2 - Calcaneus Wound Laterality: Left, Distal Cleanser: Soap and Water 1 x Per Day/30 Days Discharge Instructions: May shower and wash wound with dial antibacterial soap and water prior to dressing change. Cleanser: Wound Cleanser 1 x Per Day/30 Days Discharge Instructions: Cleanse the wound with wound cleanser prior to applying [name] clean dressing using gauze sponges, not tissue or cotton balls. Peri-Wound Care: Zinc Oxide Ointment 30g tube 1 x Per Day/30 Days Discharge Instructions: Apply Zinc Oxide to periwound with each dressing change Peri-Wound Care: Sween Lotion (Moisturizing lotion) 1 x Per Day/30 Days Discharge Instructions: Apply moisturizing lotion as directed Topical: Keystone 1 x Per Day/30 Days Prim Dressing: Hydrofera Blue Ready Transfer Foam, 8x8 (in/in) 1 x Per Day/30 Days ary Discharge Instructions: Apply to wound bed as instructed Secondary Dressing: ABD Pad, 8x10 1 x Per Day/30 Days Discharge Instructions: Apply over primary dressing as directed. Secondary Dressing: Woven Gauze Sponge, Non-Sterile 4x4 in (Home Health) 1 x Per Day/30 Days Discharge Instructions: Apply over primary dressing as directed. Secured With: [employer], 4.5x3.1 (in/yd) 1 x Per Day/30 Days Discharge Instructions: Secure with Kerlix as directed. Compression Wrap: tubigrip size E 1 x Per Day/30 Days Discharge Instructions: double layer apply in clinic today. Apply in the morning and remove at night. Electronic Signature(s) Signed: 11/24/2022 12:37:22 PM By: Kalman Shan  DO Signed: 11/24/2022 4:50:49 PM By: Deon Pilling RN, BSN Entered By: Deon Pilling on 11/24/2022 11:40:50 -------------------------------------------------------------------------------- Problem List Details Patient Name: Date of Service: [name]. 11/24/2022 11:00 [name] Medical Record Number: [id number] Patient Account Number:  [id number] Date of Birth/Sex: Treating RN: 01-07-1961 (62 y.o. [name] Primary Care Provider: Jenean Lindau Other Clinician: Referring Provider: Treating Provider/Extender: Elise Benne in Treatment: 37 Active Problems Hakanson, Elise [name] ([id number]) [id number] Page 5 of 10 ICD-10 Encounter Code Description Active Date MDM Diagnosis L97.522 Non-pressure chronic ulcer of other part of left foot with fat layer exposed 03/05/2022 No Yes E11.621 Type 2 diabetes mellitus with foot ulcer 03/05/2022 No Yes E66.01 Morbid (severe) obesity due to excess calories 03/05/2022 No Yes Inactive Problems Resolved Problems Electronic Signature(s) Signed: 11/24/2022 12:37:22 PM By: Kalman Shan DO Entered By: Kalman Shan on 11/24/2022 11:28:52 -------------------------------------------------------------------------------- Progress Note Details Patient Name: Date of Service: [name]. 11/24/2022 11:00 [name] Medical Record Number: [id number] Patient Account Number: [id number] Date of Birth/Sex: Treating RN: [birth year] (62 y.o. F) Primary Care Provider: Jenean Lindau Other Clinician: Referring Provider: Treating Provider/Extender: Elise Benne in Treatment: 37 Subjective Chief Complaint Information obtained from Patient 03/05/2022; left foot wound History of Present Illness (HPI) Admission 03/05/2022 [name] is [name] 62 year old female with [name] past medical history of uncontrolled insulin-dependent type 2 diabetes, tobacco user and chronic diastolic heart  failure that presents to the clinic for [name] 60-monthhistory of wound to her left heel. She states she had [name] left ankle fusion in September 2022. She states that she always had [name] wound after the surgery and it never healed. She has home health and they have been doing compression wraps along with silver alginate to the wound bed. She currently denies signs of infection. 6/8; this is [name] 62year old woman with type 2 diabetes. She developed [name] wound on her left Achilles heel just above the tip of the heel in the setting of recurrent ankle surgeries in late 2022. I have seen some of these results from either the cast or the surgical boots that are put on after these operations. She thinks this may be the case. And [name] sense of pressure ulcer. In any case that she has been using Santyl Hydrofera Blue under compression. She is not wearing any footwear at home as she cannot find anything to accommodate the wrap 6/22; patient presents for follow-up. She has home health that comes out once [name] week to help with dressing changes. She has no issues or complaints today. We have been using Hydrofera Blue under compression therapy. 7/6; patient presents for follow-up. She states that home health did not come out for the past 2 weeks. It is unclear why. We have been using Hydrofera Blue and Santyl under compression therapy. 7/13; patient presents for follow-up. She did not take the oral antibiotics prescribed at last clinic visit. We have been using Hydrofera Blue with gentamicin/mupirocin ointment under compression therapy. She has no issues or complaints today. She denies signs of infection. 7/20; patient presents for follow-up. We have been using Hydrofera Blue with antibiotic ointment under 3 layer compression. She has home health that change the dressing once. They put collagen on the wound bed. She also reports falling yesterday and hitting her right foot. She has no pain to the area today. 7/27; patient presents for  follow-up. We have been using Hydrofera Blue under 3 layer compression. She has home health that changes the dressing. She has no issues or complaints today. 8/3; patient presents for follow-up. We continues to use Hydrofera Blue under 3 layer compression. She has no issues or complaints today. She has been approved for Epicord at 100%. 8/11;  patient presents for follow-up. We have been using Hydrofera Blue under 3 layer compression. She has been approved for Epicord and we have this today. She is in agreement with having this applied. 8/18; patient presents for follow-up. Epicord #1 was placed in standard fashion last clinic visit. She has no issues or complaints today. 9/18; Unfortunately patient has missed her last clinic appointments due to falling and breaking her right ankle. We have been following her for her left ankle wound. She has also developed [name] large blister to the left heel over the past week that has ruptured and dried. She currently resides In [facility] Jaquith, [name] ([id number]) [id number].pdf Page 6 of 10 Wound9/25; patient presents for follow-up. We have been using Hydrofera Blue and Santyl to the original left ankle wound and Xeroform to the dried blistered. We have been wrapping her with compression therapy. She resides in [name] facility and they changed the wrap once this past week. She has no issues or complaints today. She denies signs of infection. 10/3; patient presents for follow-up. We have been using Xeroform to the heel wound and Epicort to the left ankle wound All under compression therapy. She has no issues or complaints today. 10/10; patient presents for follow-up. We have been using Epicort to the left ankle wound and Santyl and Hydrofera Blue to the heel wound. All under compression therapy. she has no issues or complaints today. 10/16; patient presents for follow-up. Epicort was placed in standard fashion to the superior wound and onto  the heel and Santyl and Hydrofera Blue. She states that the wrap got wet during her shower and her facility was able to rewrap with Kerlix/Coban. 10/23; patient presents for follow-up. Epicord was placed to the wound beds last clinic visit. We continue Kerlix/Coban. She has no issues or complaints today. 10/31; Patient presents for follow-up. Epicord was placed to the wound beds last clinic visit. The original wound is almost healed. We have done this under Kerlix/Coban. She denies signs of infection. 11/10; patient presents for follow-up. Patient's last epi cord was placed in standard fashion at last clinic visit. The original wound has healed. She still has [name] heel wound. Grafix was approved however too costly for the patient. We will rerun epi cord to see if she can have more applications. She had done very well with this. We have been using Hydrofera Blue to the heel under Kerlix/Coban. 11/21; patient presents for follow-up. We have been using Hydrofera Blue and Santyl to the heel under Kerlix/Coban. She switched to [name] new healthcare insurance and again The skin substitute is too costly for the patient. She denies signs of infection. 11/28; patient presents for follow-up. Her facility did not change the wrap last clinic visit due to lack of staffing. The wrap was taken off and Hydrofera Blue dressing has been in place for the past week. She has no issues or complaints today. 12/5; patient presents for follow-up. She states she has had more drainage over the past week. They did not change the wrap at her facility. She states the nurse will be back this week. She is also been doing [name] lot of physical therapy. She has been using the Prevalon boot while in bed. 12/12; patient presents for follow-up. We have been using Hydrofera Blue with antibiotic ointment under 4-layer compression. She is tolerated the wrap well. She states she is using her Prevalon boot while in the wheelchair. She has no issues or  complaints today. There is been improvement in  wound healing. 12/19; patient presents for follow-up. We have been using Hydrofera Blue and antibiotic ointment to the wound bed under 4-layer compression. Her facility change the wrap once. She states she has been using the Prevalon boot in bed but not in the wheelchair due to issues with transferring. Overall there is still improvement in wound healing. 1/16; patient presents for follow-up. She missed her last clinic appointment. We have been using Hydrofera Blue and antibiotic ointment under 4-layer compression. At the facility this has only been changed twice over the past 3 weeks. They are not using 4-layer compression. She came in with Kerlix/Coban. 1/23; patient presents for follow-up. We have been using Santyl and Hydrofera Blue under 4-layer compression. Patient has no issues or complaints today. 2/1; patient presents for follow-up. Patient's been using Santyl and Hydrofera Blue to the wound bed. She has developed more slough and now [name] mild odor. She states she is wearing the Prevalon boot at night. It is unclear if she is offloading this area during the day. 2/13; patient presents for follow-up. Patient's been using [employer]'s wet-to-dry dressings. She received her Keystone antibiotic ointment in the mail. She has not used this yet. She reports using her Prevalon boot. She has no issues or complaints today. 2/20; patient presents for follow-up. She has been using Keystone antibiotic ointment with Hydrofera Blue. She has no issues or complaints today. Due to transportation she cannot do the total contact cast today. She is scheduled for next week to have this placed. Patient History Information obtained from Patient, Chart. Family History Cancer - Father,Mother, Diabetes - Mother,Maternal Grandparents,Paternal Grandparents, Heart Disease - Siblings,Maternal Grandparents, Hypertension - Siblings, Tuberculosis - Maternal Grandparents,Paternal  Grandparents. Social History Current every day smoker, Marital Status - Single, Alcohol Use - Never, Drug Use - No History, Caffeine Use - Daily. 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 Vitals Time Taken: 10:48 AM, Height: 66 in, Weight: 339 lbs, BMI: 54.7, Temperature: 98.3 F, Pulse: 103 bpm, Respiratory Rate: 16 breaths/min, Blood Pressure: 168/83 mmHg, Capillary Blood Glucose: 114 mg/dl. Cardiovascular 2+ dorsalis pedis/posterior tibialis pulses. Psychiatric pleasant and cooperative. General Notes: Left foot: T the heel there is an open wound with granulation tissue and nonviable tissue. No surrounding signs of soft tissue infection. o Integumentary (Hair, Skin) Wound #2 status is Open. Original cause of wound was Blister. The date acquired was: 06/15/2022. The wound has been in treatment 21 weeks. The wound is located on the Left,Distal Calcaneus. The wound measures 3.2cm length x 4.8cm width x 0.1cm depth; 12.064cm^2 area and 1.206cm^3 volume. There is Fat Layer (Subcutaneous Tissue) exposed. There is no tunneling or undermining noted. There is [name] medium amount of serosanguineous drainage noted. The wound margin is distinct with the outline attached to the wound base. There is  small (1-33%) red, pink granulation within the wound bed. There is [name] large (67-100%) amount of necrotic tissue within the wound bed including Adherent Slough. The periwound skin appearance had no abnormalities noted for  texture. The periwound skin appearance had no abnormalities noted for color. The periwound skin appearance did not exhibit: Dry/Scaly, Maceration. Periwound temperature was noted as No Abnormality. Assessment Active Problems ICD-10 Non-pressure chronic ulcer of other part of left foot with fat layer exposed Type 2 diabetes mellitus with foot ulcer Morbid (severe) obesity due to excess calories Patient's wound is stable. I debrided nonviable tissue. I recommended continuing the course with Keystone antibiotic ointment and Hydrofera Blue. It appears that she is not offloading this area. Hopefully this can be addressed with the total contact cast. This will be placed next week. Follow-up in 1 week. Continue with Prevalon boot. Procedures Wound #2 Pre-procedure diagnosis of Wound #2 is [name] Diabetic Wound/Ulcer of the Lower Extremity located on the Left,Distal Calcaneus .Severity of Tissue Pre Debridement is: Fat layer exposed. There was [name] Excisional Skin/Subcutaneous Tissue Debridement with [name] total area of 15.36 sq cm performed by Kalman Shan, DO. With the following instrument(s): Curette to remove Viable and Non-Viable tissue/material. Material removed includes Subcutaneous Tissue, Slough, Skin: Dermis, and Skin: Epidermis after achieving pain control using Lidocaine 4% Topical Solution. [name] time out was conducted at 11:00, prior to the start of the procedure. [name] Minimum amount of bleeding was controlled with Pressure. The procedure was tolerated well with [name] pain level of 0 throughout and [name] pain level of 0 following the procedure. Post Debridement Measurements: 3.2cm length x 4.8cm width x 0.1cm depth; 1.206cm^3 volume. Character of Wound/Ulcer Post Debridement requires further  debridement. Severity of Tissue Post Debridement is: Fat layer exposed. Post procedure Diagnosis Wound #2: Same as Pre-Procedure Plan Follow-up Appointments: Return Appointment in 1 week. - Dr. Heber Marianne and Allayne Butcher Rm # 9 Tuesday 11/24/22 @ 1100 extra time plan for total contact cast Return Appointment in 2 weeks. - 12/01/2022 cast change 0800 Other: - Patient to use more prevalon boot in bed and wheelchair. Do not transfer when in wheelchair. ****facility to change dressing daily.**** Anesthetic: (In clinic) Topical Lidocaine 5% applied to wound bed Cellular or Tissue Based Products: Cellular or Tissue Based Product Type: - Run IVR for EpiFix and Epicord=100% covered 05/07/22: Epicord ordered Cellular or Tissue Based Product applied to wound bed, secured with steri-strips, cover with Adaptic or Mepitel. (DO NOT REMOVE). - Epicord #1 05/15/25 Epicord #2 05/22/22 Epicord #3 06/29/2022 Epicord #4 07/07/2022 Epifix #5 07/14/22 Epicord #6 07/20/22 Epicord # 7 07/27/22 ***Run for Grafix***PENDING Epicord #8 08/04/2022 Edema Control - Lymphedema / SCD / Other: Elevate legs to the level of the heart or above for 30 minutes daily and/or when sitting for 3-4 times [name] day throughout the day. Avoid standing for long periods of time. Off-Loading: Prevalon Boot - use while in bed and chair. Additional Orders / Instructions: Follow Nutritious Diet - Monitor/Control Blood Sugar WOUND #2: - Calcaneus Wound Laterality: Left, Distal First, [name] ([id number].pdf Page 8 of 10 Cleanser: Soap and Water 1 x Per Day/30 Days Discharge Instructions: May shower and wash wound with dial antibacterial soap and water prior to dressing change. Cleanser: Wound Cleanser 1 x Per Day/30 Days Discharge Instructions: Cleanse the wound with wound cleanser prior to applying [name] clean dressing using gauze sponges, not tissue or cotton balls. Peri-Wound Care: Zinc Oxide Ointment 30g tube 1 x Per Day/30  Days Discharge Instructions: Apply Zinc Oxide to periwound with each dressing change Peri-Wound Care: Sween Lotion (Moisturizing lotion) 1 x Per Day/30 Days Discharge Instructions: Apply moisturizing lotion as directed Topical: Keystone 1 x Per Day/30 Days Prim  Dressing: Hydrofera Blue Ready Transfer Foam, 8x8 (in/in) 1 x Per Day/30 Days ary Discharge Instructions: Apply to wound bed as instructed Secondary Dressing: ABD Pad, 8x10 1 x Per Day/30 Days Discharge Instructions: Apply over primary dressing as directed. Secondary Dressing: Woven Gauze Sponge, Non-Sterile 4x4 in (Home Health) 1 x Per Day/30 Days Discharge Instructions: Apply over primary dressing as directed. Secured With: [employer], 4.5x3.1 (in/yd) 1 x Per Day/30 Days Discharge Instructions: Secure with Kerlix as directed. Com pression Wrap: tubigrip size E 1 x Per Day/30 Days Discharge Instructions: double layer apply in clinic today. Apply in the morning and remove at night. 1. In office sharp debridement 2. Hydrofera Blue and Keystone antibiotic ointment 3. Aggressive offloadingooPrevalon boot 4. Follow-up in 1 weekooplan is for the total contact cast Electronic Signature(s) Signed: 11/30/2022 4:58:37 PM By: Deon Pilling RN, BSN Signed: 12/08/2022 10:54:50 AM By: Kalman Shan DO Previous Signature: 11/24/2022 12:37:22 PM Version By: Kalman Shan DO Entered By: Deon Pilling on 11/30/2022 14:23:23 -------------------------------------------------------------------------------- HxROS Details Patient Name: Date of Service: [name]. 11/24/2022 11:00 [name] Medical Record Number: [id number] Patient Account Number: [id number] Date of Birth/Sex: Treating RN: [birth year] (62 y.o. F) Primary Care Provider: Jenean Lindau Other Clinician: Referring Provider: Treating Provider/Extender: Elise Benne in Treatment: 37 Information Obtained From Patient  Chart Hematologic/Lymphatic Medical History: Positive for: Anemia Respiratory Medical History: Positive for: Chronic Obstructive Pulmonary Disease (COPD) Cardiovascular Medical History: Positive for: Congestive Heart Failure - Weighs self daily; Hypertension; Myocardial Infarction; Peripheral Venous Disease Gastrointestinal Medical History: Past Medical History Notes: GERD, Peptic Ulcer Disease Endocrine Medical History: Positive for: Type II Diabetes Time with diabetes: 16 years Treated with: Insulin, Oral agents Blood sugar tested every day: Yes Tested : Twice daily Musculoskeletal Gionfriddo, Saree [name] ([id number] Page 9 of 10 Medical History: Past Medical History Notes: Broke left leg 3 years ago, 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, foot fused Neurologic Medical History: Positive for: Neuropathy Psychiatric Medical History: Past Medical History Notes: Depression Immunizations Pneumococcal Vaccine: Received Pneumococcal Vaccination: No Implantable Devices None Family and Social History Cancer: Yes - Father,Mother; Diabetes: Yes - Mother,Maternal Grandparents,Paternal Grandparents; Heart Disease: Yes - Siblings,Maternal Grandparents; Hypertension: Yes - Siblings; Tuberculosis: Yes - Maternal Grandparents,Paternal Grandparents; Current every day smoker; Marital Status - Single; Alcohol Use: Never; Drug Use: No History; Caffeine Use: Daily; Financial Concerns: No; Food, Clothing or Shelter Needs: No; Support System Lacking: No; Transportation Concerns: No Electronic Signature(s) Signed: 11/24/2022 12:37:22 PM By: Kalman Shan DO Entered By: Kalman Shan on 11/24/2022 11:29:58 -------------------------------------------------------------------------------- SuperBill Details Patient Name: Date of Service: [name]. 11/24/2022 Medical Record Number: [id number] Patient Account Number: [id number] Date of Birth/Sex: Treating  RN: 1961/09/26 (62 y.o. [name] Primary Care Provider: Jenean Lindau Other Clinician: Referring Provider: Treating Provider/Extender: Elise Benne in Treatment: 37 Diagnosis Coding ICD-10 Codes Code Description [phone] Non-pressure chronic ulcer of other part of left foot with fat layer exposed E11.621 Type 2 diabetes mellitus with foot ulcer E66.01 Morbid (severe) obesity due to excess calories Facility Procedures : CPT4 Code: [id number] Description: [id number] - DEB SUBQ TISSUE 20 SQ CM/< ICD-10 Diagnosis Description L97.522 Non-pressure chronic ulcer of other part of left foot with fat layer exposed E11.621 Type 2 diabetes mellitus with foot ulcer Modifier: Quantity: 1 Physician Procedures : CPT4 Code Description Modifier [id number] - WC PHYS SUBQ TISS 20 SQ CM ICD-10 Diagnosis Description L97.522 Non-pressure chronic ulcer of other part of left foot with fat  layer exposed E11.621 Type 2 diabetes mellitus with foot ulcer Quantity: 1 Electronic Signature(s) Signed: 11/24/2022 12:37:22 PM By: Kalman Shan DO Purohit, Luevenia [name] ([id number] Page 10 of 10 Signed: 11/24/2022 12:37:22 PM By: Kalman Shan DO Entered By: Kalman Shan on 11/24/2022 11:34:13

## 2022-11-25 NOTE — Progress Notes (Signed)
[place], [name] ([id number]) [id number].pdf Page 1 of 8 Visit Report for 11/24/2022 Arrival Information Details Patient Name: Date of Service: [name] 11/24/2022 11:00 A M Medical Record Number: [id number] Patient Account Number: [id number] Date of Birth/Sex: Treating RN: 09-07-1961 (62 y.o. [name], [name] Primary Care Makeya Hilgert: Jenean Lindau Other Clinician: Referring Danylle Ouk: Treating Alesia Oshields/Extender: Elise Benne in Treatment: 89 Visit Information History Since Last Visit Added or deleted any medications: No Patient Arrived: Wheel Chair Any new allergies or adverse reactions: No Arrival Time: 10:47 Had a fall or experienced change in No Accompanied By: self activities of daily living that may affect Transfer Assistance: None risk of falls: Patient Identification Verified: Yes Signs or symptoms of abuse/neglect since last visito No Secondary Verification Process Completed: Yes Hospitalized since last visit: No Patient Requires Transmission-Based Precautions: No Implantable device outside of the clinic excluding No Patient Has Alerts: No cellular tissue based products placed in the center since last visit: Has Dressing in Place as Prescribed: Yes Pain Present Now: Yes Electronic Signature(s) Signed: 11/24/2022 4:50:49 PM By: Deon Pilling RN, BSN Entered By: Deon Pilling on 11/24/2022 10:47:33 -------------------------------------------------------------------------------- Encounter Discharge Information Details Patient Name: Date of Service: [name] Lax [name]. 11/24/2022 11:00 A M Medical Record Number: [id number] Patient Account Number: [id number] Date of Birth/Sex: Treating RN: 08-Jan-1961 (62 y.o. [name] Primary Care Jorgen Wolfinger: Jenean Lindau Other Clinician: Referring Janautica Netzley: Treating Ashlon Lottman/Extender: Elise Benne in Treatment: 37 Encounter Discharge  Information Items Post Procedure Vitals Discharge Condition: Stable Temperature (F): 98.3 Ambulatory Status: Wheelchair Pulse (bpm): 103 Discharge Destination: [place] Respiratory Rate (breaths/min): 20 Telephoned: No Blood Pressure (mmHg): 168/83 Orders Sent: Yes Transportation: Private Auto Accompanied By: self Schedule Follow-up Appointment: Yes Clinical Summary of Care: Electronic Signature(s) Signed: 11/24/2022 4:50:49 PM By: Deon Pilling RN, BSN Entered By: Deon Pilling on 11/24/2022 11:08:00 [name] ([id number].pdf Page 2 of 8 -------------------------------------------------------------------------------- Lower Extremity Assessment Details Patient Name: Date of Service: [name] Lax [name]. 11/24/2022 11:00 A M Medical Record Number: [id number] Patient Account Number: [id number] Date of Birth/Sex: Treating RN: 10-Dec-1960 (62 y.o. [name], [name] Primary Care Dominiq Fontaine: Jenean Lindau Other Clinician: Referring Batina Dougan: Treating Dreya Buhrman/Extender: Elise Benne in Treatment: 37 Edema Assessment Assessed: Shirlyn Goltz: Yes] Patrice Paradise: No] Edema: [Left: Ye] [Right: s] Calf Left: Right: Point of Measurement: 28 cm From Medial Instep 48 cm Ankle Left: Right: Point of Measurement: 3 cm From Medial Instep 31 cm Vascular Assessment Pulses: Dorsalis Pedis Palpable: [Left:Yes] Electronic Signature(s) Signed: 11/24/2022 4:50:49 PM By: Deon Pilling RN, BSN Entered By: Deon Pilling on 11/24/2022 10:50:04 -------------------------------------------------------------------------------- Multi Wound Chart Details Patient Name: Date of Service: [name] Lax [name]. 11/24/2022 11:00 A M Medical Record Number: [id number] Patient Account Number: [id number] Date of Birth/Sex: Treating RN: 04/11/1961 (62 y.o. 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 Anemia, Chronic Obstructive N/A N/A Comorbid History: Pulmonary Disease (COPD), Congestive Heart Failure, Hypertension, Myocardial Infarction, Peripheral Venous Disease, Type II Diabetes, Neuropathy 06/15/2022 N/A N/A Date Acquired: 21 N/A N/A Weeks of  Treatment: Open N/A N/A Wound Status: No N/A N/A Wound Recurrence: 3.2x4.8x0.1 N/A N/A Measurements L x W x D (cm) 12.064 N/A N/A A (cm) : rea 1.206 N/A N/A Volume (cm) : 14.70% N/A N/A % Reduction in A rea: 14.70% N/A N/A % Reduction in Volume: Grade 2 N/A N/A Classification: Medium N/A N/A Exudate A mount: Serosanguineous N/A N/A Exudate Type: red, brown N/A N/A Exudate Color: Distinct, outline attached N/A N/A Wound Margin: Small (1-33%) N/A N/A Granulation A mount: Red, Pink N/A N/A Granulation Quality: Large (67-100%) N/A N/A Necrotic A mount: Fat Layer (Subcutaneous Tissue): Yes N/A N/A Exposed Structures: Fascia: No Tendon: No Muscle: No Joint: No Bone: No Small (1-33%) N/A N/A Epithelialization: Debridement - Excisional N/A N/A Debridement: Pre-procedure Verification/Time Out 11:00 N/A N/A Taken: Lidocaine 4% Topical Solution N/A N/A Pain Control: Subcutaneous, Slough N/A N/A Tissue Debrided: Skin/Subcutaneous Tissue N/A N/A Level: 15.36 N/A N/A Debridement A (sq cm): rea Curette N/A N/A Instrument: Minimum N/A N/A Bleeding: Pressure N/A  N/A Hemostasis A chieved: 0 N/A N/A Procedural Pain: 0 N/A N/A Post Procedural Pain: Procedure was tolerated well N/A N/A Debridement Treatment Response: 3.2x4.8x0.1 N/A N/A Post Debridement Measurements L x W x D (cm) 1.206 N/A N/A Post Debridement Volume: (cm) No Abnormalities Noted N/A N/A Periwound Skin Texture: Maceration: No N/A N/A Periwound Skin Moisture: Dry/Scaly: No No Abnormalities Noted N/A N/A Periwound Skin Color: No Abnormality N/A N/A Temperature: Debridement N/A N/A Procedures Performed: Treatment Notes Wound #2 (Calcaneus) Wound Laterality: Left, Distal Cleanser Soap and Water Discharge Instruction: May shower and wash wound with dial antibacterial soap and water prior to dressing change. Wound Cleanser Discharge Instruction: Cleanse the wound with wound cleanser prior to applying a clean dressing using gauze sponges, not tissue or cotton balls. Peri-Wound Care Zinc Oxide Ointment 30g tube Discharge Instruction: Apply Zinc Oxide to periwound with each dressing change Sween Lotion (Moisturizing lotion) Discharge Instruction: Apply moisturizing lotion as directed Topical Keystone Primary Dressing Hydrofera Blue Ready Transfer Foam, 8x8 (in/in) Discharge Instruction: Apply to wound bed as instructed Secondary Dressing ABD Pad, 8x10 Discharge Instruction: Apply over primary dressing as directed. [place], Kachina [name] ([id number]) [id number].pdf Page 4 of 8 Woven Gauze Sponge, Non-Sterile 4x4 in Discharge Instruction: Apply over primary dressing as directed. Secured With [employer], 4.5x3.1 (in/yd) Discharge Instruction: Secure with Kerlix as directed. Compression Wrap tubigrip size E Discharge Instruction: double layer apply in clinic today. Apply in the morning and remove at night. Compression Stockings Add-Ons Electronic Signature(s) Signed: 11/24/2022 12:37:22 PM By: Kalman Shan DO Entered By: Kalman Shan on  11/24/2022 11:28:57 -------------------------------------------------------------------------------- Multi-Disciplinary Care Plan Details Patient Name: Date of Service: [name] Lax [name]. 11/24/2022 11:00 A M Medical Record Number: [id number] Patient Account Number: [id number] Date of Birth/Sex: Treating RN: 11-22-1960 (62 y.o. [name], [name] Primary Care Florena Kozma: Jenean Lindau Other Clinician: Referring Chrysten Woulfe: Treating Annalisia Ingber/Extender: Elise Benne in Treatment: 37 Active Inactive Abuse / Safety / Falls / Self Care Management Nursing Diagnoses: History of Falls Goals: Patient will not experience any injury related to falls Date Initiated: 03/05/2022 Target Resolution Date: 01/01/2023 Goal Status: Active Interventions: Assess fall risk on admission and as needed Assess: immobility, friction, shearing, incontinence upon admission and as needed Assess impairment of mobility on admission and as needed per policy Notes: [id number]: Fall prevention ongoing, recent fall. Wound/Skin Impairment Nursing Diagnoses: Impaired tissue integrity Goals: Patient/caregiver will verbalize understanding of skin care regimen Date Initiated: 03/05/2022 Target Resolution Date: 01/01/2023 Goal Status:  Active Ulcer/skin breakdown will have a volume reduction of 30% by week 4 Date Initiated: 03/05/2022 Date Inactivated: 04/30/2022 Target Resolution Date: 05/02/2022 Goal Status: Met Ulcer/skin breakdown will have a volume reduction of 50% by week 8 Date Initiated: 04/30/2022 Date Inactivated: 07/07/2022 Target Resolution Date: 07/04/2022 Unmet Reason: patient sent three Goal Status: Unmet weeks inpatient and rehab before returning to wound center. [place], Jacqualynn [name] ([id number]) [id number].pdf Page 5 of 8 Interventions: Assess patient/caregiver ability to obtain necessary supplies Assess patient/caregiver ability to perform ulcer/skin care regimen upon  admission and as needed Assess ulceration(s) every visit Provide education on smoking Provide education on ulcer and skin care Treatment Activities: Topical wound management initiated : 03/05/2022 Notes: 04/30/22: Wound care regimen continues. Electronic Signature(s) Signed: 11/24/2022 4:50:49 PM By: Deon Pilling RN, BSN Entered By: Deon Pilling on 11/24/2022 10:55:31 -------------------------------------------------------------------------------- Pain Assessment Details Patient Name: Date of Service: [name] MY [name]. 11/24/2022 11:00 A M Medical Record Number: [id number] Patient Account Number: [id number] Date of Birth/Sex: Treating RN: 1961/03/20 (62 y.o. [name] Primary Care Lizzy Hamre: Jenean Lindau Other Clinician: Referring Ivee Poellnitz: Treating Kearney Evitt/Extender: Elise Benne in Treatment: 37 Active Problems Location of Pain Severity and Description of Pain Patient Has Paino Yes Site Locations Pain Location: Generalized Pain, Pain in Ulcers Rate the pain. Current Pain Level: 6 Pain Management and Medication Current Pain Management: Medication: No Cold Application: No Rest: No Massage: No Activity: No T.E.N.S.: No Heat Application: No Leg drop or elevation: No Is the Current Pain Management Adequate: Adequate How does your wound impact your activities of daily livingo Sleep: No Bathing: No Appetite: No Relationship With Others: No Bladder Continence: No Emotions: No Bowel Continence: No Work: No Toileting: No Drive: No Dressing: No Hobbies: No Hoskinson, Jermisha [name] ([id number].pdf Page 6 of 8 Electronic Signature(s) Signed: 11/24/2022 4:50:49 PM By: Deon Pilling RN, BSN Entered By: Deon Pilling on 11/24/2022 10:47:51 -------------------------------------------------------------------------------- Patient/Caregiver Education Details Patient Name: Date of Service: [name]  2/20/2024andnbsp11:00 [place] Record Number: [id number] Patient Account Number: [id number] Date of Birth/Gender: Treating RN: [birth year] (62 y.o. [name] Primary Care Physician: Jenean Lindau Other Clinician: Referring Physician: Treating Physician/Extender: Elise Benne in Treatment: 60 Education Assessment Education Provided To: Patient Education Topics Provided Wound/Skin Impairment: Handouts: Caring for Your Ulcer Methods: Explain/Verbal Responses: Reinforcements needed Electronic Signature(s) Signed: 11/24/2022 4:50:49 PM By: Deon Pilling RN, BSN Entered By: Deon Pilling on 11/24/2022 10:55:44 -------------------------------------------------------------------------------- Wound Assessment Details Patient Name: Date of Service: [name] Lax [name]. 11/24/2022 11:00 A M Medical Record Number: [id number] Patient Account Number: [id number] Date of Birth/Sex: Treating RN: 08/20/1961 (62 y.o. [name], [name] Primary Care Marlyn Tondreau: Jenean Lindau Other Clinician: Referring Ramirez Fullbright: Treating Kymberlyn Eckford/Extender: Elise Benne in Treatment: 37 Wound Status Wound Number: 2 Primary Diabetic Wound/Ulcer of the Lower Extremity Etiology: Wound Location: Left, Distal Calcaneus Wound Open Wounding Event: Blister Status: Date Acquired: 06/15/2022 Comorbid Anemia, Chronic Obstructive Pulmonary Disease (COPD), Weeks Of Treatment: 21 History: Congestive Heart Failure, Hypertension, Myocardial Infarction, Clustered Wound: No Peripheral Venous Disease, Type II Diabetes, Neuropathy Photos [place], Arthelia [name] ([id number].pdf Page 7 of 8 Wound Measurements Length: (cm) 3.2 Width: (cm) 4.8 Depth: (cm) 0.1 Area: (cm) 12.064 Volume: (cm) 1.206 % Reduction in Area: 14.7% % Reduction in Volume: 14.7% Epithelialization: Small (1-33%) Tunneling: No Undermining: No Wound  Description Classification: Grade 2 Wound Margin: Distinct, outline attached Exudate Amount: Medium Exudate Type: Serosanguineous Exudate Color: red, brown  Foul Odor After Cleansing: No Slough/Fibrino Yes Wound Bed Granulation Amount: Small (1-33%) Exposed Structure Granulation Quality: Red, Pink Fascia Exposed: No Necrotic Amount: Large (67-100%) Fat Layer (Subcutaneous Tissue) Exposed: Yes Necrotic Quality: Adherent Slough Tendon Exposed: No Muscle Exposed: No Joint Exposed: No Bone Exposed: No Periwound Skin Texture Texture Color No Abnormalities Noted: Yes No Abnormalities Noted: Yes Moisture Temperature / Pain No Abnormalities Noted: No Temperature: No Abnormality Dry / Scaly: No Maceration: No Treatment Notes Wound #2 (Calcaneus) Wound Laterality: Left, Distal Cleanser Soap and Water Discharge Instruction: May shower and wash wound with dial antibacterial soap and water prior to dressing change. Wound Cleanser Discharge Instruction: Cleanse the wound with wound cleanser prior to applying a clean dressing using gauze sponges, not tissue or cotton balls. Peri-Wound Care Zinc Oxide Ointment 30g tube Discharge Instruction: Apply Zinc Oxide to periwound with each dressing change Sween Lotion (Moisturizing lotion) Discharge Instruction: Apply moisturizing lotion as directed Topical Keystone Primary Dressing Hydrofera Blue Ready Transfer Foam, 8x8 (in/in) Discharge Instruction: Apply to wound bed as instructed Secondary Dressing ABD Pad, 8x10 Discharge Instruction: Apply over primary dressing as directed. Woven Gauze Sponge, Non-Sterile 4x4 in Discharge Instruction: Apply over primary dressing as directed. [place], [name] ([id number]) [id number].pdf Page 8 of 8 Secured With [employer], 4.5x3.1 (in/yd) Discharge Instruction: Secure with Kerlix as directed. Compression Wrap tubigrip size E Discharge Instruction: double layer apply in  clinic today. Apply in the morning and remove at night. Compression Stockings Add-Ons Electronic Signature(s) Signed: 11/24/2022 4:50:49 PM By: Deon Pilling RN, BSN Entered By: Deon Pilling on 11/24/2022 10:53:47 -------------------------------------------------------------------------------- Vitals Details Patient Name: Date of Service: [name] Lax [name]. 11/24/2022 11:00 A M Medical Record Number: [id number] Patient Account Number: [id number] Date of Birth/Sex: Treating RN: 05/02/1961 (62 y.o. [name], [name] Primary Care Evolet Salminen: Jenean Lindau Other Clinician: Referring Annaly Skop: Treating Tyleigh Mahn/Extender: Elise Benne in Treatment: 37 Vital Signs Time Taken: 10:48 Temperature (F): 98.3 Height (in): 66 Pulse (bpm): 103 Weight (lbs): 339 Respiratory Rate (breaths/min): 16 Body Mass Index (BMI): 54.7 Blood Pressure (mmHg): 168/83 Capillary Blood Glucose (mg/dl): 114 Reference Range: 80 - 120 mg / dl Electronic Signature(s) Signed: 11/24/2022 4:50:49 PM By: Deon Pilling RN, BSN Entered By: Deon Pilling on 11/24/2022 10:49:56

## 2022-11-26 ENCOUNTER — Encounter (HOSPITAL_BASED_OUTPATIENT_CLINIC_OR_DEPARTMENT_OTHER): Payer: 59 | Admitting: Internal Medicine

## 2022-12-01 ENCOUNTER — Encounter (HOSPITAL_BASED_OUTPATIENT_CLINIC_OR_DEPARTMENT_OTHER): Payer: 59 | Admitting: Internal Medicine

## 2022-12-01 DIAGNOSIS — L97522 Non-pressure chronic ulcer of other part of left foot with fat layer exposed: Secondary | ICD-10-CM | POA: Diagnosis not present

## 2022-12-01 DIAGNOSIS — E11621 Type 2 diabetes mellitus with foot ulcer: Secondary | ICD-10-CM

## 2022-12-03 ENCOUNTER — Encounter (HOSPITAL_BASED_OUTPATIENT_CLINIC_OR_DEPARTMENT_OTHER): Payer: 59 | Admitting: Internal Medicine

## 2022-12-03 DIAGNOSIS — E11621 Type 2 diabetes mellitus with foot ulcer: Secondary | ICD-10-CM

## 2022-12-03 DIAGNOSIS — L97522 Non-pressure chronic ulcer of other part of left foot with fat layer exposed: Secondary | ICD-10-CM | POA: Diagnosis not present

## 2022-12-03 NOTE — Progress Notes (Signed)
Novitsky, [name] ([id number]) [id number] Page 1 of 10 Visit Report for 12/01/2022 Chief Complaint Document Details Patient Name: Date of Service: [name]. 12/01/2022 8:00 [name] M Medical Record Number: [id number] Patient Account Number: [id number] Date of Birth/Sex: Treating RN: [birth year] (62 y.o. F) Primary Care Provider: Jenean Lindau Other Clinician: Referring Provider: Treating Provider/Extender: Elise Benne in [id number] Information Obtained from: Patient Chief Complaint 03/05/2022; left foot wound Electronic Signature(s) Signed: 12/01/2022 12:29:31 PM By: Kalman Shan DO Entered By: Kalman Shan on 12/01/2022 09:45:00 -------------------------------------------------------------------------------- Debridement Details Patient Name: Date of Service: [name]. 12/01/2022 8:00 [name] M Medical Record Number: [id number] Patient Account Number: [id number] Date of Birth/Sex: Treating RN: [birth year] (62 y.o. [name], [name] Primary Care Provider: Jenean Lindau Other Clinician: Referring Provider: Treating Provider/Extender: Elise Benne in Treatment: 38 Debridement Performed for Assessment: Wound #2 Left,Distal Calcaneus Performed By: Clinician Deon Pilling, RN Debridement Type: Chemical/Enzymatic/Mechanical Agent Used: Santyl Severity of Tissue Pre Debridement: Fat layer exposed Level of Consciousness (Pre-procedure): Awake and Alert Pre-procedure Verification/Time Out No Taken: Bleeding: None Response to Treatment: Procedure was tolerated well Level of Consciousness (Post- Awake and Alert procedure): Post Debridement Measurements of Total Wound Length: (cm) 3.5 Width: (cm) 4.8 Depth: (cm) 0.1 Volume: (cm) 1.319 Character of Wound/Ulcer Post Debridement: Requires Further Debridement Severity of Tissue Post Debridement: Fat layer exposed Post Procedure  Diagnosis Same as Pre-procedure Electronic Signature(s) Signed: 12/01/2022 12:29:31 PM By: Kalman Shan DO Signed: 12/02/2022 6:07:49 PM By: Deon Pilling RN, BSN Entered By: Deon Pilling on 12/01/2022 08:41:58 -------------------------------------------------------------------------------- HPI Details Patient Name: Date of Service: [name], [name] MY [name]. 12/01/2022 8:00 [name] M Medical Record Number: [id number] Patient Account Number: [id number] Date of Birth/Sex: Treating RN: [birth year] (62 y.o. F) Primary Care Provider: Jenean Lindau Other Clinician: Burney Gauze, Tionne [name] ([id number]) [id number] Page 2 of 10 Referring Provider: Treating Provider/Extender: Elise Benne in Treatment: 38 History of Present Illness HPI Description: Admission 03/05/2022 [name] is [name] 62 year old female with [name] past medical history of uncontrolled insulin-dependent type 2 diabetes, tobacco user and chronic diastolic heart failure that presents to the clinic for [name] 86-monthhistory of wound to her left heel. She states she had [name] left ankle fusion in September 2022. She states that she always had [name] wound after the surgery and it never healed. She has home health and they have been doing compression wraps along with silver alginate to the wound bed. She currently denies signs of infection. 6/8; this is [name] 62year old woman with type 2 diabetes. She developed [name] wound on her left Achilles heel just above the tip of the heel in the setting of recurrent ankle surgeries in late 2022. I have seen some of these results from either the cast or the surgical boots that are put on after these operations. She thinks this may be the case. And [name] sense of pressure ulcer. In any case that she has been using Santyl Hydrofera Blue under compression. She is not wearing any footwear at home as she cannot find anything to accommodate the wrap 6/22; patient presents for follow-up. She  has home health that comes out once [name] week to help with dressing changes. She has no issues or complaints today. We have been using Hydrofera Blue under compression therapy. 7/6; patient presents for follow-up. She states that home health did not come out for the past 2 weeks. It is unclear why. We have been using Hydrofera  Blue and Santyl under compression therapy. 7/13; patient presents for follow-up. She did not take the oral antibiotics prescribed at last clinic visit. We have been using Hydrofera Blue with gentamicin/mupirocin ointment under compression therapy. She has no issues or complaints today. She denies signs of infection. 7/20; patient presents for follow-up. We have been using Hydrofera Blue with antibiotic ointment under 3 layer compression. She has home health that change the dressing once. They put collagen on the wound bed. She also reports falling yesterday and hitting her right foot. She has no pain to the area today. 7/27; patient presents for follow-up. We have been using Hydrofera Blue under 3 layer compression. She has home health that changes the dressing. She has no issues or complaints today. 8/3; patient presents for follow-up. We continues to use Hydrofera Blue under 3 layer compression. She has no issues or complaints today. She has been approved for Epicord at 100%. 8/11; patient presents for follow-up. We have been using Hydrofera Blue under 3 layer compression. She has been approved for Epicord and we have this today. She is in agreement with having this applied. 8/18; patient presents for follow-up. Epicord #1 was placed in standard fashion last clinic visit. She has no issues or complaints today. 9/18; Unfortunately patient has missed her last clinic appointments due to falling and breaking her right ankle. We have been following her for her left ankle wound. She has also developed [name] large blister to the left heel over the past week that has ruptured and dried. She  currently resides In [facility]. Wound9/25; patient presents for follow-up. We have been using Hydrofera Blue and Santyl to the original left ankle wound and Xeroform to the dried blistered. We have been wrapping her with compression therapy. She resides in [name] facility and they changed the wrap once this past week. She has no issues or complaints today. She denies signs of infection. 10/3; patient presents for follow-up. We have been using Xeroform to the heel wound and Epicort to the left ankle wound All under compression therapy. She has no issues or complaints today. 10/10; patient presents for follow-up. We have been using Epicort to the left ankle wound and Santyl and Hydrofera Blue to the heel wound. All under compression therapy. she has no issues or complaints today. 10/16; patient presents for follow-up. Epicort was placed in standard fashion to the superior wound and onto the heel and Santyl and Hydrofera Blue. She states that the wrap got wet during her shower and her facility was able to rewrap with Kerlix/Coban. 10/23; patient presents for follow-up. Epicord was placed to the wound beds last clinic visit. We continue Kerlix/Coban. She has no issues or complaints today. 10/31; Patient presents for follow-up. Epicord was placed to the wound beds last clinic visit. The original wound is almost healed. We have done this under Kerlix/Coban. She denies signs of infection. 11/10; patient presents for follow-up. Patient's last epi cord was placed in standard fashion at last clinic visit. The original wound has healed. She still has [name] heel wound. Grafix was approved however too costly for the patient. We will rerun epi cord to see if she can have more applications. She had done very well with this. We have been using Hydrofera Blue to the heel under Kerlix/Coban. 11/21; patient presents for follow-up. We have been using Hydrofera Blue and Santyl to the heel under Kerlix/Coban. She switched to  [name] new healthcare insurance and again The skin substitute is too costly for the  patient. She denies signs of infection. 11/28; patient presents for follow-up. Her facility did not change the wrap last clinic visit due to lack of staffing. The wrap was taken off and Hydrofera Blue dressing has been in place for the past week. She has no issues or complaints today. 12/5; patient presents for follow-up. She states she has had more drainage over the past week. They did not change the wrap at her facility. She states the nurse will be back this week. She is also been doing [name] lot of physical therapy. She has been using the Prevalon boot while in bed. 12/12; patient presents for follow-up. We have been using Hydrofera Blue with antibiotic ointment under 4-layer compression. She is tolerated the wrap well. She states she is using her Prevalon boot while in the wheelchair. She has no issues or complaints today. There is been improvement in wound healing. 12/19; patient presents for follow-up. We have been using Hydrofera Blue and antibiotic ointment to the wound bed under 4-layer compression. Her facility change the wrap once. She states she has been using the Prevalon boot in bed but not in the wheelchair due to issues with transferring. Overall there is still improvement in wound healing. 1/16; patient presents for follow-up. She missed her last clinic appointment. We have been using Hydrofera Blue and antibiotic ointment under 4-layer compression. At the facility this has only been changed twice over the past 3 weeks. They are not using 4-layer compression. She came in with Kerlix/Coban. 1/23; patient presents for follow-up. We have been using Santyl and Hydrofera Blue under 4-layer compression. Patient has no issues or complaints today. 2/1; patient presents for follow-up. Patient's been using Santyl and Hydrofera Blue to the wound bed. She has developed more slough and now [name] mild odor. She states she is  wearing the Prevalon boot at night. It is unclear if she is offloading this area during the day. 2/13; patient presents for follow-up. Patient's been using Dakin's wet-to-dry dressings. She received her Keystone antibiotic ointment in the mail. She has not Velez, Isolde [name] ([id number]) [phone].pdf Page 3 of 10 used this yet. She reports using her Prevalon boot. She has no issues or complaints today. 2/20; patient presents for follow-up. She has been using Keystone antibiotic ointment with Hydrofera Blue. She has no issues or complaints today. Due to transportation she cannot do the total contact cast today. She is scheduled for next week to have this placed. 2/27; patient presents for follow-up. Plan is for the total contact cast today. We have been using Keystone and Hydrofera Blue to the wound bed. She forgot her Keystone antibiotic ointment. She has no issues or complaints today. Electronic Signature(s) Signed: 12/01/2022 12:29:31 PM By: Kalman Shan DO Entered By: Kalman Shan on 12/01/2022 09:49:12 -------------------------------------------------------------------------------- Physical Exam Details Patient Name: Date of Service: HUFFMA [name], [name] MY [name]. 12/01/2022 8:00 [name] M Medical Record Number: [id number] Patient Account Number: [id number] Date of Birth/Sex: Treating RN: April 28, 1961 (62 y.o. F) Primary Care Provider: Jenean Lindau Other Clinician: Referring Provider: Treating Provider/Extender: Elise Benne in Treatment: 38 Constitutional respirations regular, non-labored and within target range for patient.. Cardiovascular 2+ dorsalis pedis/posterior tibialis pulses. Psychiatric pleasant and cooperative. Notes Left foot: T the heel there is an open wound with granulation tissue and nonviable tissue. No surrounding signs of soft tissue infection. o Electronic Signature(s) Signed: 12/01/2022 12:29:31 PM By: Kalman Shan  DO Entered By: Kalman Shan on 12/01/2022 09:49:45 -------------------------------------------------------------------------------- Physician Orders Details Patient Name: Date of  Service: [name], [name] MY [name]. 12/01/2022 8:00 [name] M Medical Record Number: [id number] Patient Account Number: [id number] Date of Birth/Sex: Treating RN: 01-Nov-1960 (62 y.o. [name], [name] Primary Care Provider: Jenean Lindau Other Clinician: Referring Provider: Treating Provider/Extender: Elise Benne in Treatment: 50 Verbal / Phone Orders: No Diagnosis Coding ICD-10 Coding Code Description [phone] Non-pressure chronic ulcer of other part of left foot with fat layer exposed E11.621 Type 2 diabetes mellitus with foot ulcer E66.01 Morbid (severe) obesity due to excess calories Follow-up Appointments ppointment in 1 week. - already has appt- with Dr. Heber [facility] Return [name] ppointment in 2 weeks. - already has appt- with Dr. Heber [facility] Return [name] Other: - Leave dressing in place. Facility not to change dressing. Anesthetic (In clinic) Topical Lidocaine 5% applied to wound bed Cellular or Tissue Based Products Cellular or Tissue Based Product Type: - Run IVR for EpiFix and Epicord=100% covered 05/07/22: Epicord ordered daptic or Mepitel. (DO NOT REMOVE). - Cellular or Tissue Based Product applied to wound bed, secured with steri-strips, cover with [name] Gallentine, Shamarra [name] ([id number]) [phone].pdf Page 4 of 10 Epicord #1 05/15/25 Epicord #2 05/22/22 Epicord #3 06/29/2022 Epicord #4 07/07/2022 Epifix #5 07/14/22 Epicord #6 07/20/22 Epicord # 7 07/27/22 ***Run for Grafix***PENDING Epicord #8 08/04/2022 Edema Control - Lymphedema / SCD / Other Elevate legs to the level of the heart or above for 30 minutes daily and/or when sitting for 3-4 times [name] day throughout the day. Avoid standing for long periods of time. Off-Loading Total Contact Cast to Left Lower Extremity -  size 4 Additional Orders / Instructions Follow Nutritious Diet - Monitor/Control Blood Sugar Wound Treatment Wound #2 - Calcaneus Wound Laterality: Left, Distal Cleanser: Soap and Water 1 x Per Week/30 Days Discharge Instructions: May shower and wash wound with dial antibacterial soap and water prior to dressing change. Cleanser: Wound Cleanser 1 x Per Week/30 Days Discharge Instructions: Cleanse the wound with wound cleanser prior to applying [name] clean dressing using gauze sponges, not tissue or cotton balls. Peri-Wound Care: Zinc Oxide Ointment 30g tube 1 x Per Week/30 Days Discharge Instructions: Apply Zinc Oxide to periwound with each dressing change Topical: Gentamicin 1 x Per Week/30 Days Discharge Instructions: As directed by physician Topical: Mupirocin Ointment 1 x Per Week/30 Days Discharge Instructions: Apply Mupirocin (Bactroban) as instructed Prim Dressing: PolyMem Silver Non-Adhesive Dressing, 4.25x4.25 in 1 x Per Week/30 Days ary Discharge Instructions: Apply to wound bed as instructed Prim Dressing: Santyl Ointment 1 x Per Week/30 Days ary Discharge Instructions: Apply nickel thick amount to wound bed as instructed Secondary Dressing: ABD Pad, 8x10 1 x Per Week/30 Days Discharge Instructions: Apply over primary dressing as directed. Secondary Dressing: Woven Gauze Sponge, Non-Sterile 4x4 in (Home Health) 1 x Per Week/30 Days Discharge Instructions: Apply over primary dressing as directed. Secured With: [employer], 4.5x3.1 (in/yd) 1 x Per Week/30 Days Discharge Instructions: Secure with Kerlix as directed. Electronic Signature(s) Signed: 12/01/2022 12:29:31 PM By: Kalman Shan DO Entered By: Kalman Shan on 12/01/2022 09:50:08 -------------------------------------------------------------------------------- Problem List Details Patient Name: Date of Service: [name], [name] MY [name]. 12/01/2022 8:00 [name] M Medical Record Number: [id number] Patient Account Number:  [id number] Date of Birth/Sex: Treating RN: 12/05/1960 (62 y.o. [name] Primary Care Provider: Jenean Lindau Other Clinician: Referring Provider: Treating Provider/Extender: Elise Benne in Treatment: 38 Active Problems ICD-10 Encounter Code Description Active Date MDM Diagnosis L97.522 Non-pressure chronic ulcer of other part of left foot with fat layer exposed  03/05/2022 No Yes Lavalley, Falen [name] ([id number]) [id number] Page 5 of 10 E11.621 Type 2 diabetes mellitus with foot ulcer 03/05/2022 No Yes E66.01 Morbid (severe) obesity due to excess calories 03/05/2022 No Yes Inactive Problems Resolved Problems Electronic Signature(s) Signed: 12/01/2022 12:29:31 PM By: Kalman Shan DO Entered By: Kalman Shan on 12/01/2022 09:43:52 -------------------------------------------------------------------------------- Progress Note Details Patient Name: Date of Service: [name]. 12/01/2022 8:00 [name] M Medical Record Number: [id number] Patient Account Number: [id number] Date of Birth/Sex: Treating RN: 1961-07-19 (62 y.o. F) Primary Care Provider: Jenean Lindau Other Clinician: Referring Provider: Treating Provider/Extender: Elise Benne in Treatment: 38 Subjective Chief Complaint Information obtained from Patient 03/05/2022; left foot wound History of Present Illness (HPI) Admission 03/05/2022 Ms. Brielynn Chene is [name] 61 year old female with [name] past medical history of uncontrolled insulin-dependent type 2 diabetes, tobacco user and chronic diastolic heart failure that presents to the clinic for [name] 87-monthhistory of wound to her left heel. She states she had [name] left ankle fusion in September 2022. She states that she always had [name] wound after the surgery and it never healed. She has home health and they have been doing compression wraps along with silver alginate to the wound bed. She currently  denies signs of infection. 6/8; this is [name] 62year old woman with type 2 diabetes. She developed [name] wound on her left Achilles heel just above the tip of the heel in the setting of recurrent ankle surgeries in late 2022. I have seen some of these results from either the cast or the surgical boots that are put on after these operations. She thinks this may be the case. And [name] sense of pressure ulcer. In any case that she has been using Santyl Hydrofera Blue under compression. She is not wearing any footwear at home as she cannot find anything to accommodate the wrap 6/22; patient presents for follow-up. She has home health that comes out once [name] week to help with dressing changes. She has no issues or complaints today. We have been using Hydrofera Blue under compression therapy. 7/6; patient presents for follow-up. She states that home health did not come out for the past 2 weeks. It is unclear why. We have been using Hydrofera Blue and Santyl under compression therapy. 7/13; patient presents for follow-up. She did not take the oral antibiotics prescribed at last clinic visit. We have been using Hydrofera Blue with gentamicin/mupirocin ointment under compression therapy. She has no issues or complaints today. She denies signs of infection. 7/20; patient presents for follow-up. We have been using Hydrofera Blue with antibiotic ointment under 3 layer compression. She has home health that change the dressing once. They put collagen on the wound bed. She also reports falling yesterday and hitting her right foot. She has no pain to the area today. 7/27; patient presents for follow-up. We have been using Hydrofera Blue under 3 layer compression. She has home health that changes the dressing. She has no issues or complaints today. 8/3; patient presents for follow-up. We continues to use Hydrofera Blue under 3 layer compression. She has no issues or complaints today. She has been approved for Epicord at  100%. 8/11; patient presents for follow-up. We have been using Hydrofera Blue under 3 layer compression. She has been approved for Epicord and we have this today. She is in agreement with having this applied. 8/18; patient presents for follow-up. Epicord #1 was placed in standard fashion last clinic visit. She has no issues or complaints today.  9/18; Unfortunately patient has missed her last clinic appointments due to falling and breaking her right ankle. We have been following her for her left ankle wound. She has also developed [name] large blister to the left heel over the past week that has ruptured and dried. She currently resides In [facility]. Wound9/25; patient presents for follow-up. We have been using Hydrofera Blue and Santyl to the original left ankle wound and Xeroform to the dried blistered. We have been wrapping her with compression therapy. She resides in [name] facility and they changed the wrap once this past week. She has no issues or complaints today. She denies signs of infection. 10/3; patient presents for follow-up. We have been using Xeroform to the heel wound and Epicort to the left ankle wound All under compression therapy. She has no issues or complaints today. Sawka, Kaelynn [name] ([id number]) [id number].pdf Page 6 of 10 10/10; patient presents for follow-up. We have been using Epicort to the left ankle wound and Santyl and Hydrofera Blue to the heel wound. All under compression therapy. she has no issues or complaints today. 10/16; patient presents for follow-up. Epicort was placed in standard fashion to the superior wound and onto the heel and Santyl and Hydrofera Blue. She states that the wrap got wet during her shower and her facility was able to rewrap with Kerlix/Coban. 10/23; patient presents for follow-up. Epicord was placed to the wound beds last clinic visit. We continue Kerlix/Coban. She has no issues or complaints today. 10/31; Patient presents for  follow-up. Epicord was placed to the wound beds last clinic visit. The original wound is almost healed. We have done this under Kerlix/Coban. She denies signs of infection. 11/10; patient presents for follow-up. Patient's last epi cord was placed in standard fashion at last clinic visit. The original wound has healed. She still has [name] heel wound. Grafix was approved however too costly for the patient. We will rerun epi cord to see if she can have more applications. She had done very well with this. We have been using Hydrofera Blue to the heel under Kerlix/Coban. 11/21; patient presents for follow-up. We have been using Hydrofera Blue and Santyl to the heel under Kerlix/Coban. She switched to [name] new healthcare insurance and again The skin substitute is too costly for the patient. She denies signs of infection. 11/28; patient presents for follow-up. Her facility did not change the wrap last clinic visit due to lack of staffing. The wrap was taken off and Hydrofera Blue dressing has been in place for the past week. She has no issues or complaints today. 12/5; patient presents for follow-up. She states she has had more drainage over the past week. They did not change the wrap at her facility. She states the nurse will be back this week. She is also been doing [name] lot of physical therapy. She has been using the Prevalon boot while in bed. 12/12; patient presents for follow-up. We have been using Hydrofera Blue with antibiotic ointment under 4-layer compression. She is tolerated the wrap well. She states she is using her Prevalon boot while in the wheelchair. She has no issues or complaints today. There is been improvement in wound healing. 12/19; patient presents for follow-up. We have been using Hydrofera Blue and antibiotic ointment to the wound bed under 4-layer compression. Her facility change the wrap once. She states she has been using the Prevalon boot in bed but not in the wheelchair due to issues with  transferring. Overall there is still improvement  in wound healing. 1/16; patient presents for follow-up. She missed her last clinic appointment. We have been using Hydrofera Blue and antibiotic ointment under 4-layer compression. At the facility this has only been changed twice over the past 3 weeks. They are not using 4-layer compression. She came in with Kerlix/Coban. 1/23; patient presents for follow-up. We have been using Santyl and Hydrofera Blue under 4-layer compression. Patient has no issues or complaints today. 2/1; patient presents for follow-up. Patient's been using Santyl and Hydrofera Blue to the wound bed. She has developed more slough and now [name] mild odor. She states she is wearing the Prevalon boot at night. It is unclear if she is offloading this area during the day. 2/13; patient presents for follow-up. Patient's been using [employer]'s wet-to-dry dressings. She received her Keystone antibiotic ointment in the mail. She has not used this yet. She reports using her Prevalon boot. She has no issues or complaints today. 2/20; patient presents for follow-up. She has been using Keystone antibiotic ointment with Hydrofera Blue. She has no issues or complaints today. Due to transportation she cannot do the total contact cast today. She is scheduled for next week to have this placed. 2/27; patient presents for follow-up. Plan is for the total contact cast today. We have been using Keystone and Hydrofera Blue to the wound bed. She forgot her Keystone antibiotic ointment. She has no issues or complaints today. Patient History Information obtained from Patient, Chart. Family History Cancer - Father,Mother, Diabetes - Mother,Maternal Grandparents,Paternal Grandparents, Heart Disease - Siblings,Maternal Grandparents, Hypertension - Siblings, Tuberculosis - Maternal Grandparents,Paternal Grandparents. Social History Current every day smoker, Marital Status - Single, Alcohol Use - Never, Drug Use -  No History, Caffeine Use - Daily. 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 Vitals Time Taken: 8:11 AM, Height: 66 in, Weight: 339 lbs, BMI: 54.7, Temperature: 98.5 F, Pulse: 101 bpm, Respiratory Rate: 22 breaths/min, Blood Pressure: 156/77 mmHg, Capillary Blood Glucose: 80 mg/dl. Cardiovascular 2+ dorsalis pedis/posterior tibialis pulses. Psychiatric pleasant and cooperative. General Notes: Left foot: T the heel there is an open wound with granulation tissue and nonviable tissue. No surrounding signs of soft tissue infection. o Integumentary (Hair, Skin) Wound #2 status is Open. Original cause of wound was Blister. The date acquired was: 06/15/2022. The wound has been in treatment 22 weeks. The wound is located on the Left,Distal Calcaneus. The wound measures 3.5cm length x 4.8cm width x 0.1cm depth; 13.195cm^2 area and 1.319cm^3 volume. There is Fat Layer (Subcutaneous Tissue) exposed. There is no tunneling or undermining noted. There is [name] medium amount of serosanguineous drainage noted. The wound margin is distinct with the outline attached to the wound base. There is medium (34-66%) red, pink granulation within the wound bed. There is [name] medium (34-66%) amount of necrotic tissue  within the wound bed including Adherent Slough. The periwound skin appearance had no abnormalities noted for texture. The periwound skin appearance had no abnormalities noted for color. The periwound skin appearance did not  exhibit: Dry/Scaly, Maceration. Periwound temperature was noted as No Abnormality. Assessment Active Problems ICD-10 Non-pressure chronic ulcer of other part of left foot with fat layer exposed Type 2 diabetes mellitus with foot ulcer Morbid (severe) obesity due to excess calories Patient's wound is stable. I recommended gent/mupirocin since she does not have her Keystone antibiotic ointment along with Santyl and PolyMem silver under the total contact cast. She will be back in 2 days for her obligatory cast change. There were no obvious signs of infection. Procedures Wound #2 Pre-procedure diagnosis of Wound #2 is [name] Diabetic Wound/Ulcer of the Lower Extremity located on the Left,Distal Calcaneus .Severity of Tissue Pre Debridement is: Fat layer exposed. There was [name] Chemical/Enzymatic/Mechanical debridement performed by Deon Pilling, RN.Marland Kitchen Agent used was [employer]. There was no bleeding. The procedure was tolerated well. Post Debridement Measurements: 3.5cm length x 4.8cm width x 0.1cm depth; 1.319cm^3 volume. Character of Wound/Ulcer Post Debridement requires further debridement. Severity of Tissue Post Debridement is: Fat layer exposed. Post procedure Diagnosis Wound #2: Same as Pre-Procedure Pre-procedure diagnosis of Wound #2 is [name] Diabetic Wound/Ulcer of the Lower Extremity located on the Left,Distal Calcaneus . There was [name] T Programmer, multimedia otal Procedure by Kalman Shan, DO. Post procedure Diagnosis Wound #2: Same as Pre-Procedure Notes: left heel cast size 4.. Plan Follow-up Appointments: Return Appointment in 1 week. - already has appt- with Dr. Heber [place] Return Appointment in 2 weeks. - already has appt- with Dr. Heber Arnold Other: - Leave dressing in place. Facility  not to change dressing. Anesthetic: (In clinic) Topical Lidocaine 5% applied to wound bed Cellular or Tissue Based Products: Cellular or Tissue Based Product Type: - Run IVR for EpiFix and Epicord=100% covered 05/07/22: Epicord ordered Cellular or Tissue Based Product applied to wound bed, secured with steri-strips, cover with Adaptic or Mepitel. (DO NOT REMOVE). - Epicord #1 05/15/25 Epicord #2 05/22/22 Epicord #3 06/29/2022 Epicord #4 07/07/2022 Epifix #5 07/14/22 Epicord #6 07/20/22 Epicord # 7 07/27/22 ***Run for Grafix***PENDING Epicord #8 08/04/2022 Edema Control - Lymphedema / SCD / Other: Elevate legs to the level of the heart or above for 30 minutes daily and/or when sitting for 3-4 times [name] day throughout the day. Avoid standing for long periods of time. Off-Loading: T Contact Cast to Left Lower Extremity - size 4 otal Additional Orders / Instructions: Follow Nutritious Diet - Monitor/Control Blood Sugar WOUND #2: - Calcaneus Wound Laterality: Left, Distal Cleanser: Soap and Water 1 x Per Week/30 Days Discharge Instructions: May shower and wash wound with dial antibacterial soap and water prior to dressing change. Yankey, Rianne [name] ([id number]) [id number].pdf Page 8 of 10 Cleanser: Wound Cleanser 1 x Per Week/30 Days Discharge Instructions: Cleanse the wound with wound cleanser prior to applying [name] clean dressing using gauze sponges, not tissue or cotton balls. Peri-Wound Care: Zinc Oxide Ointment 30g tube 1 x Per Week/30 Days Discharge Instructions: Apply Zinc Oxide to periwound with each dressing change Topical: Gentamicin 1 x Per Week/30 Days Discharge Instructions: As directed by physician Topical: Mupirocin Ointment 1 x Per Week/30 Days Discharge Instructions: Apply Mupirocin (Bactroban) as instructed Prim Dressing: PolyMem Silver Non-Adhesive Dressing, 4.25x4.25 in 1 x Per Week/30 Days ary Discharge Instructions: Apply to wound bed as instructed Prim  Dressing: Santyl Ointment 1 x Per Week/30 Days ary Discharge Instructions: Apply nickel thick amount to wound bed as instructed Secondary Dressing: ABD Pad, 8x10 1 x Per Week/30 Days Discharge Instructions: Apply over primary dressing as directed. Secondary Dressing: Woven Gauze Sponge, Non-Sterile 4x4 in (  Home Health) 1 x Per Week/30 Days Discharge Instructions: Apply over primary dressing as directed. Secured With: [employer], 4.5x3.1 (in/yd) 1 x Per Week/30 Days Discharge Instructions: Secure with Kerlix as directed. 1. Antibiotic ointment, Santyl and PolyMem silver 2. T contact cast placed in standard fashion otal 3. Follow-up in 2 days for obligatory cast change Electronic Signature(s) Signed: 12/01/2022 12:29:31 PM By: Kalman Shan DO Entered By: Kalman Shan on 12/01/2022 09:51:13 -------------------------------------------------------------------------------- HxROS Details Patient Name: Date of Service: [name], [name] MY [name]. 12/01/2022 8:00 [name] M Medical Record Number: [id number] Patient Account Number: [id number] Date of Birth/Sex: Treating RN: July 25, 1961 (62 y.o. F) Primary Care Provider: Jenean Lindau Other Clinician: Referring Provider: Treating Provider/Extender: Elise Benne in Treatment: 38 Information Obtained From Patient Chart Hematologic/Lymphatic Medical History: Positive for: Anemia Respiratory Medical History: Positive for: Chronic Obstructive Pulmonary Disease (COPD) Cardiovascular Medical History: Positive for: Congestive Heart Failure - Weighs self daily; Hypertension; Myocardial Infarction; Peripheral Venous Disease Gastrointestinal Medical History: Past Medical History Notes: GERD, Peptic Ulcer Disease Endocrine Medical History: Positive for: Type II Diabetes Time with diabetes: 16 years Treated with: Insulin, Oral agents Blood sugar tested every day: Yes Tested : Twice daily Musculoskeletal Medical  History: Past Medical History Notes: Broke left leg 3 years ago, 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, foot fused Mccloud, Omni [name] ([id number]) [id number] Page 9 of 10 Neurologic Medical History: Positive for: Neuropathy Psychiatric Medical History: Past Medical History Notes: Depression Immunizations Pneumococcal Vaccine: Received Pneumococcal Vaccination: No Implantable Devices None Family and Social History Cancer: Yes - Father,Mother; Diabetes: Yes - Mother,Maternal Grandparents,Paternal Grandparents; Heart Disease: Yes - Siblings,Maternal Grandparents; Hypertension: Yes - Siblings; Tuberculosis: Yes - Maternal Grandparents,Paternal Grandparents; Current every day smoker; Marital Status - Single; Alcohol Use: Never; Drug Use: No History; Caffeine Use: Daily; Financial Concerns: No; Food, Clothing or Shelter Needs: No; Support System Lacking: No; Transportation Concerns: No Electronic Signature(s) Signed: 12/01/2022 12:29:31 PM By: Kalman Shan DO Entered By: Kalman Shan on 12/01/2022 09:49:22 -------------------------------------------------------------------------------- Total Contact Cast Details Patient Name: Date of Service: Eleanora Neighbor MY [name]. 12/01/2022 8:00 [name] M Medical Record Number: [id number] Patient Account Number: [id number] Date of Birth/Sex: Treating RN: Feb 03, 1961 (62 y.o. [name] Primary Care Provider: Jenean Lindau Other Clinician: Referring Provider: Treating Provider/Extender: Elise Benne in [id number] T Contact Cast Applied for Wound Assessment: otal Wound #2 Left,Distal Calcaneus Performed By: Physician Kalman Shan, DO Post Procedure Diagnosis Same as Pre-procedure Notes left heel cast size 4. Electronic Signature(s) Signed: 12/01/2022 12:29:31 PM By: Kalman Shan DO Signed: 12/02/2022 6:07:49 PM By: Deon Pilling RN, BSN Entered By: Deon Pilling on 12/01/2022  08:42:14 -------------------------------------------------------------------------------- SuperBill Details Patient Name: Date of Service: [name]. 12/01/2022 Medical Record Number: [id number] Patient Account Number: [id number] Date of Birth/Sex: Treating RN: 1961/09/18 (62 y.o. [name] Primary Care Provider: Jenean Lindau Other Clinician: Referring Provider: Treating Provider/Extender: Elise Benne in Treatment: 38 Diagnosis Coding ICD-10 Codes Code Description [phone] Non-pressure chronic ulcer of other part of left foot with fat layer exposed E11.621 Type 2 diabetes mellitus with foot ulcer Shryock, Desirey [name] ([id number]) [id number] Page 10 of 10 E66.01 Morbid (severe) obesity due to excess calories Facility Procedures : CPT4 Code: [id number] Description: [id number] - APPLY TOTAL CONTACT LEG CAST ICD-10 Diagnosis Description L97.522 Non-pressure chronic ulcer of other part of left foot with fat layer exposed E11.621 Type 2 diabetes mellitus with foot ulcer Modifier: Quantity: 1 Physician Procedures : CPT4 Code Description Modifier  [id number] - WC PHYS APPLY TOTAL CONTACT CAST ICD-10 Diagnosis Description L97.522 Non-pressure chronic ulcer of other part of left foot with fat layer exposed E11.621 Type 2 diabetes mellitus with foot ulcer Quantity: 1 Electronic Signature(s) Signed: 12/01/2022 12:29:31 PM By: Kalman Shan DO Entered By: Kalman Shan on 12/01/2022 09:51:23

## 2022-12-03 NOTE — Progress Notes (Signed)
Lull, Demarie [name] ([id number]) [id number].pdf Page 1 of 7 Visit Report for 12/01/2022 Arrival Information Details Patient Name: Date of Service: [name]. 12/01/2022 8:00 [name] Medical Record Number: [id number] Patient Account Number: [id number] Date of Birth/Sex: Treating RN: [birth year] (62 y.o. [name], [name] Primary Care Vesna Kable: Jenean Lindau Other Clinician: Referring Yuya Vanwingerden: Treating Gareld Obrecht/Extender: Elise Benne in Treatment: 38 Visit Information History Since Last Visit Added or deleted any medications: No Patient Arrived: Wheel Chair Any new allergies or adverse reactions: No Arrival Time: 08:10 Had [name] fall or experienced change in No Accompanied By: self activities of daily living that may affect Transfer Assistance: Manual risk of falls: Patient Identification Verified: Yes Signs or symptoms of abuse/neglect since last visito No Secondary Verification Process Completed: Yes Hospitalized since last visit: No Patient Requires Transmission-Based Precautions: No Implantable device outside of the clinic excluding No Patient Has Alerts: No cellular tissue based products placed in the center since last visit: Has Dressing in Place as Prescribed: Yes Has Footwear/Offloading in Place as Prescribed: No Left: Other:prevalon boot Pain Present Now: Yes Electronic Signature(s) Signed: 12/02/2022 6:07:49 PM By: Deon Pilling RN, BSN Entered By: Deon Pilling on 12/01/2022 08:11:22 -------------------------------------------------------------------------------- Encounter Discharge Information Details Patient Name: Date of Service: [name]. 12/01/2022 8:00 [name] Medical Record Number: [id number] Patient Account Number: [id number] Date of Birth/Sex: Treating RN: 10-15-1960 (62 y.o. [name] Primary Care Lacreasha Hinds: Jenean Lindau Other Clinician: Referring Vylet Maffia: Treating Alanea Woolridge/Extender: Elise Benne in Treatment: 38 Encounter Discharge Information Items Post Procedure Vitals Discharge Condition: Stable Temperature (F): 98.5 Ambulatory Status: Wheelchair Pulse (bpm): 101 Discharge Destination: Home Respiratory Rate (breaths/min): 22 Transportation: Private Auto Blood Pressure (mmHg): 156/77 Accompanied By: self Schedule Follow-up Appointment: Yes Clinical Summary of Care: Electronic Signature(s) Signed: 12/02/2022 6:07:49 PM By: Deon Pilling RN, BSN Entered By: Deon Pilling on 12/01/2022 08:43:21 -------------------------------------------------------------------------------- Lower Extremity Assessment Details Patient Name: Date of Service: [name]. 12/01/2022 8:00 [name] Medical Record Number: [id number] Patient Account Number: [id number] Date of Birth/Sex: Treating RN: Jun 05, 1961 (62 y.o. [name], [name] Primary Care Jolie Strohecker: Jenean Lindau Other Clinician: Referring Darely Becknell: Treating Azelyn Batie/Extender: Elise Benne in Treatment: 38 Edema Assessment Left: [Left: Right] [Right: :] Assessed: [Left: Yes] [Right: No] Edema: [Left: Ye] [Right: s] Calf Left: Right: Point of Measurement: 28 cm From Medial Instep 48 cm Ankle Left: Right: Point of Measurement: 3 cm From Medial Instep 28 cm Vascular Assessment Pulses: Dorsalis Pedis Palpable: [Left:Yes] Electronic Signature(s) Signed: 12/02/2022 6:07:49 PM By: Deon Pilling RN, BSN Entered By: Deon Pilling on 12/01/2022 08:13:09 -------------------------------------------------------------------------------- Multi Wound Chart Details Patient Name: Date of Service: [name]. 12/01/2022 8:00 [name] Medical Record Number: [id number] Patient Account Number: [id number] Date of Birth/Sex: Treating RN: [birth year] (62 y.o. F) Primary Care Carolene Gitto: Jenean Lindau Other Clinician: Referring Evyn Kooyman: Treating Angelise Petrich/Extender: Elise Benne in Treatment: 38 Vital Signs Height(in): 30 Capillary Blood Glucose(mg/dl): 80 Weight(lbs): 339 Pulse(bpm): 101 Body Mass Index(BMI): 54.7 Blood Pressure(mmHg): 156/77 Temperature(F): 98.5 Respiratory Rate(breaths/min): 22 [2:Photos:] [[name]:[name]] Left, Distal Calcaneus [name] Wound Location: Blister [name] Wounding Event: Diabetic Wound/Ulcer of the Lower [name] Primary Etiology: Extremity Anemia, Chronic Obstructive [name] Comorbid History: Pulmonary Disease (COPD), Congestive Heart Failure, Hypertension, Myocardial Infarction, Peripheral Venous Disease, Type II Diabetes, Neuropathy 06/15/2022 [name] Date Acquired: 37 [name] Weeks of Treatment: Open [name] Wound Status: No [name] Wound  Recurrence: 3.5x4.8x0.1 [name] Measurements L x W x D (cm) 13.195 [name] [name] (cm) : rea 1.319 [name] Volume (cm) : 6.70% [name] % Reduction in [name] rea: 6.70% [name] % Reduction in Volume: Grade 2 [name] Classification: Medium [name] Exudate [name] mount: Serosanguineous [name] Exudate Type: red, brown [name] Exudate Color: [name], [name] Distinct, outline attached [name] Wound Margin: Medium (34-66%) [name] Granulation Amount: Red, Pink [name] Granulation Quality: Medium (34-66%) [name] Necrotic Amount: Fat Layer (Subcutaneous Tissue): Yes [name] Exposed Structures: Fascia: No Tendon: No Muscle: No Joint: No Bone: No Small (1-33%) [name] Epithelialization: Chemical/Enzymatic/Mechanical [name] Debridement: [name] Instrument: None [name] Bleeding: Debridement Treatment Response: Procedure was tolerated well [name] Post Debridement Measurements L x 3.5x4.8x0.1 [name] W x D (cm) 1.319 [name] Post Debridement Volume: (cm) No Abnormalities Noted [name] Periwound Skin Texture: Maceration: No [name] Periwound Skin Moisture: Dry/Scaly:  No No Abnormalities Noted [name] Periwound Skin Color: No Abnormality [name] Temperature: Debridement [name] Procedures Performed: T Contact Cast otal Treatment Notes Wound #2 (Calcaneus) Wound Laterality: Left, Distal Cleanser Soap and Water Discharge Instruction: May shower and wash wound with dial antibacterial soap and water prior to dressing change. Wound Cleanser Discharge Instruction: Cleanse the wound with wound cleanser prior to applying [name] clean dressing using gauze sponges, not tissue or cotton balls. Peri-Wound Care Zinc Oxide Ointment 30g tube Discharge Instruction: Apply Zinc Oxide to periwound with each dressing change Topical Gentamicin Discharge Instruction: As directed by physician Mupirocin Ointment Discharge Instruction: Apply Mupirocin (Bactroban) as instructed Primary Dressing PolyMem Silver Non-Adhesive Dressing, 4.25x4.25 in Discharge Instruction: Apply to wound bed as instructed Santyl Ointment Discharge Instruction: Apply nickel thick amount to wound bed as instructed Secondary Dressing ABD Pad, 8x10 Discharge Instruction: Apply over primary dressing as directed. Woven Gauze Sponge, Non-Sterile 4x4 in Discharge Instruction: Apply over primary dressing as directed. Secured With [employer], 4.5x3.1 (in/yd) Discharge Instruction: Secure with Kerlix as directed. Compression Wrap Compression Stockings Add-Ons Electronic Signature(s) Signed: 12/01/2022 12:29:31 PM By: Kalman Shan DO Entered By: Kalman Shan on 12/01/2022 09:44:13 [name], [name] ([id number] Page 4 of 7 -------------------------------------------------------------------------------- Multi-Disciplinary Care Plan Details Patient Name: Date of Service: [name]. 12/01/2022 8:00 [name] Medical Record Number: [id number] Patient Account Number: [id number] Date of Birth/Sex: Treating RN: April 16, 1961 (63 y.o. [name],  [name] Primary Care [place] Name: Jenean Lindau Other Clinician: Referring Euel Castile: Treating Akon Reinoso/Extender: Elise Benne in Treatment: 79 Active Inactive Abuse / Safety / Falls / Self Care Management Nursing Diagnoses: History of Falls Goals: Patient will not experience any injury related to falls Date Initiated: 03/05/2022 Target Resolution Date: 01/01/2023 Goal Status: Active Interventions: Assess fall risk on admission and as needed Assess: immobility, friction, shearing, incontinence upon admission and as needed Assess impairment of mobility on admission and as needed per policy Notes: [id number]: Fall prevention ongoing, recent fall. Wound/Skin Impairment Nursing Diagnoses: Impaired tissue integrity Goals: Patient/caregiver will verbalize understanding of skin care regimen Date Initiated: 03/05/2022 Target Resolution Date: 01/01/2023 Goal Status: Active Ulcer/skin breakdown will have [name] volume reduction of 30% by week 4 Date Initiated: 03/05/2022 Date Inactivated: 04/30/2022 Target Resolution Date: 05/02/2022 Goal Status: Met Ulcer/skin breakdown will have [name] volume reduction of 50% by week 8 Date Initiated: 04/30/2022 Date Inactivated: 07/07/2022 Target Resolution Date: 07/04/2022 Unmet Reason: patient sent three Goal Status:  Unmet weeks inpatient and rehab before returning to wound center. Interventions: Assess patient/caregiver ability to obtain necessary supplies Assess patient/caregiver ability to perform ulcer/skin care regimen upon admission and as needed Assess ulceration(s) every visit Provide education on smoking Provide education on ulcer and skin care Treatment Activities: Topical wound management initiated : 03/05/2022 Notes: 04/30/22: Wound care regimen continues. Electronic Signature(s) Signed: 12/02/2022 6:07:49 PM By: Deon Pilling RN, BSN Entered By: Deon Pilling on 12/01/2022  08:18:00 -------------------------------------------------------------------------------- Pain Assessment Details Patient Name: Date of Service: [name]. 12/01/2022 8:00 [name] Medical Record Number: [id number] Patient Account Number: [id number] Date of Birth/Sex: Treating RN: July 04, 1961 (62 y.o. [name], [name] Primary Care Monette Omara: Jenean Lindau Other Clinician: Referring Lakiah Dhingra: Treating Juvon Teater/Extender: Waldron Session [place], [place] [name] ([id number]) [id number] Page 5 of 7 Weeks in Treatment: 38 Active Problems Location of Pain Severity and Description of Pain Patient Has Paino Yes Site Locations Rate the pain. Current Pain Level: 5 Pain Management and Medication Current Pain Management: Medication: No Cold Application: No Rest: No Massage: No Activity: No T.E.[name].S.: No Heat Application: No Leg drop or elevation: No Is the Current Pain Management Adequate: Adequate How does your wound impact your activities of daily livingo Sleep: No Bathing: No Appetite: No Relationship With Others: No Bladder Continence: No Emotions: No Bowel Continence: No Work: No Toileting: No Drive: No Dressing: No Hobbies: No Engineer, maintenance) Signed: 12/02/2022 6:07:49 PM By: Deon Pilling RN, BSN Entered By: Deon Pilling on 12/01/2022 08:11:46 -------------------------------------------------------------------------------- Patient/Caregiver Education Details Patient Name: Date of Service: [name]. 2/27/2024andnbsp8:00 [name] Medical Record Number: [id number] Patient Account Number: [id number] Date of Birth/Gender: Treating RN: Apr 01, 1961 (62 y.o. [name] Primary Care Physician: Jenean Lindau Other Clinician: Referring Physician: Treating Physician/Extender: Elise Benne in Treatment: 58 Education Assessment Education Provided To: Patient Education Topics  Provided Wound/Skin Impairment: Handouts: Caring for Your Ulcer Methods: Explain/Verbal Responses: Reinforcements needed Electronic Signature(s) Signed: 12/02/2022 6:07:49 PM By: Deon Pilling RN, BSN [place], [place] ([id number]) [phone].pdf Page 6 of 7 Entered By: Deon Pilling on 12/01/2022 08:18:18 -------------------------------------------------------------------------------- Wound Assessment Details Patient Name: Date of Service: [name]. 12/01/2022 8:00 [name] Medical Record Number: [id number] Patient Account Number: [id number] Date of Birth/Sex: Treating RN: June 12, 1961 (62 y.o. [name], [name] Primary Care Nathyn Luiz: Jenean Lindau Other Clinician: Referring Fitzroy Mikami: Treating Klayten Jolliff/Extender: Elise Benne in Treatment: 38 Wound Status Wound Number: 2 Primary Diabetic Wound/Ulcer of the Lower Extremity Etiology: Wound Location: Left, Distal Calcaneus Wound Open Wounding Event: Blister Status: Date Acquired: 06/15/2022 Comorbid Anemia, Chronic Obstructive Pulmonary Disease (COPD), Weeks Of Treatment: 22 History: Congestive Heart Failure, Hypertension, Myocardial Infarction, Clustered Wound: No Peripheral Venous Disease, Type II Diabetes, Neuropathy Photos Wound Measurements Length: (cm) 3.5 Width: (cm) 4.8 Depth: (cm) 0.1 Area: (cm) 13.195 Volume: (cm) 1.319 % Reduction in Area: 6.7% % Reduction in Volume: 6.7% Epithelialization: Small (1-33%) Tunneling: No Undermining: No Wound Description Classification: Grade 2 Wound Margin: Distinct, outline attached Exudate Amount: Medium Exudate Type: Serosanguineous Exudate Color: red, brown Foul Odor After Cleansing: No Slough/Fibrino Yes Wound Bed Granulation Amount: Medium (34-66%) Exposed Structure Granulation Quality: Red, Pink Fascia Exposed: No Necrotic Amount: Medium (34-66%) Fat Layer (Subcutaneous Tissue) Exposed: Yes Necrotic Quality:  Adherent Slough Tendon Exposed: No Muscle Exposed: No Joint Exposed: No Bone Exposed: No Periwound Skin Texture Texture Color No Abnormalities Noted: Yes No Abnormalities Noted: Yes Moisture Temperature / Pain No Abnormalities Noted: No Temperature: No  Abnormality Dry / Scaly: No Maceration: No Electronic Signature(s) Signed: 12/02/2022 6:07:49 PM By: Deon Pilling RN, BSN Entered By: Deon Pilling on 12/01/2022 08:17:02 [place], Mattilyn [name] ([id number] Page 7 of 7 -------------------------------------------------------------------------------- Vitals Details Patient Name: Date of Service: [name]. 12/01/2022 8:00 [name] Medical Record Number: [id number] Patient Account Number: [id number] Date of Birth/Sex: Treating RN: [birth year] (62 y.o. [name], [name] Primary Care Erline Siddoway: Jenean Lindau Other Clinician: Referring Denece Shearer: Treating Yocheved Depner/Extender: Elise Benne in Treatment: 38 Vital Signs Time Taken: 08:11 Temperature (F): 98.5 Height (in): 66 Pulse (bpm): 101 Weight (lbs): 339 Respiratory Rate (breaths/min): 22 Body Mass Index (BMI): 54.7 Blood Pressure (mmHg): 156/77 Capillary Blood Glucose (mg/dl): 80 Reference Range: 80 - 120 mg / dl Electronic Signature(s) Signed: 12/02/2022 6:07:49 PM By: Deon Pilling RN, BSN Entered By: Deon Pilling on 12/01/2022 08:11:37

## 2022-12-05 NOTE — Progress Notes (Signed)
[name] ([id number]) [id number] Page 1 of 10 Visit Report for 12/03/2022 Chief Complaint Document Details Patient Name: Date of Service: [name] 12/03/2022 2:45 PM Medical Record Number: [id number] Patient Account Number: [id number] Date of Birth/Sex: Treating RN: December 11, 1960 (62 y.o. F) Primary Care Provider: Jenean Lindau Other Clinician: Referring Provider: Treating Provider/Extender: Elise Benne in [id number] Information Obtained from: Patient Chief Complaint 03/05/2022; left foot wound Electronic Signature(s) Signed: 12/03/2022 4:08:18 PM By: Kalman Shan DO Entered By: Kalman Shan on 12/03/2022 15:59:43 -------------------------------------------------------------------------------- Debridement Details Patient Name: Date of Service: [name]. 12/03/2022 2:45 PM Medical Record Number: [id number] Patient Account Number: [id number] Date of Birth/Sex: Treating RN: 09/11/1961 (62 y.o. [name], [name] Primary Care Provider: Jenean Lindau Other Clinician: Referring Provider: Treating Provider/Extender: Elise Benne in Treatment: 39 Debridement Performed for Assessment: Wound #2 Left,Distal Calcaneus Performed By: Physician Kalman Shan, DO Debridement Type: Debridement Severity of Tissue Pre Debridement: Fat layer exposed Level of Consciousness (Pre-procedure): Awake and Alert Pre-procedure Verification/Time Out Yes - 15:15 Taken: Start Time: 15:15 Pain Control: Lidocaine T Area Debrided (L x W): otal 3.4 (cm) x 5 (cm) = 17 (cm) Tissue and other material debrided: Viable, Non-Viable, Slough, Subcutaneous, Skin: Dermis , Skin: Epidermis, Slough Level: Skin/Subcutaneous Tissue Debridement Description: Excisional Instrument: Curette Bleeding: Minimum Hemostasis Achieved: Pressure End Time: 15:15 Procedural Pain: 0 Post Procedural Pain:  0 Response to Treatment: Procedure was tolerated well Level of Consciousness (Post- Awake and Alert procedure): Post Debridement Measurements of Total Wound Length: (cm) 3.4 Width: (cm) 5 Depth: (cm) 0.1 Volume: (cm) 1.335 Character of Wound/Ulcer Post Debridement: Improved Severity of Tissue Post Debridement: Fat layer exposed Post Procedure Diagnosis Same as Pre-procedure Electronic Signature(s) Signed: 12/03/2022 4:08:18 PM By: Kalman Shan DO Signed: 12/03/2022 4:19:23 PM By: Rhae Hammock RN Entered By: Rhae [name] on 12/03/2022 15:21:38 [name], [name] ([id number]) [id number] Page 2 of 10 -------------------------------------------------------------------------------- HPI Details Patient Name: Date of Service: [name] 12/03/2022 2:45 PM Medical Record Number: [id number] Patient Account Number: [id number] Date of Birth/Sex: Treating RN: [birth year] (62 y.o. F) Primary Care Provider: Jenean Lindau Other Clinician: Referring Provider: Treating Provider/Extender: Elise Benne in Treatment: 39 History of Present Illness HPI Description: Admission 03/05/2022 [name] is a 62 year old female with a past medical history of uncontrolled insulin-dependent type 2 diabetes, tobacco user and chronic diastolic heart failure that presents to the clinic for a 4-monthhistory of wound to her left heel. She states she had a left ankle fusion in September 2022. She states that she always had a wound after the surgery and it never healed. She has home health and they have been doing compression wraps along with silver alginate to the wound bed. She currently denies signs of infection. 6/8; this is a 62year old woman with type 2 diabetes. She developed a wound on her left Achilles heel just above the tip of the heel in the setting of recurrent ankle surgeries in late 2022. I have seen some of these results from  either the cast or the surgical boots that are put on after these operations. She thinks this may be the case. And a sense of pressure ulcer. In any case that she has been using Santyl Hydrofera Blue under compression. She is not wearing any footwear at home as she cannot find anything to accommodate the wrap 6/22; patient presents for follow-up. She has home health that comes out once a  week to help with dressing changes. She has no issues or complaints today. We have been using Hydrofera Blue under compression therapy. 7/6; patient presents for follow-up. She states that home health did not come out for the past 2 weeks. It is unclear why. We have been using Hydrofera Blue and Santyl under compression therapy. 7/13; patient presents for follow-up. She did not take the oral antibiotics prescribed at last clinic visit. We have been using Hydrofera Blue with gentamicin/mupirocin ointment under compression therapy. She has no issues or complaints today. She denies signs of infection. 7/20; patient presents for follow-up. We have been using Hydrofera Blue with antibiotic ointment under 3 layer compression. She has home health that change the dressing once. They put collagen on the wound bed. She also reports falling yesterday and hitting her right foot. She has no pain to the area today. 7/27; patient presents for follow-up. We have been using Hydrofera Blue under 3 layer compression. She has home health that changes the dressing. She has no issues or complaints today. 8/3; patient presents for follow-up. We continues to use Hydrofera Blue under 3 layer compression. She has no issues or complaints today. She has been approved for Epicord at 100%. 8/11; patient presents for follow-up. We have been using Hydrofera Blue under 3 layer compression. She has been approved for Epicord and we have this today. She is in agreement with having this applied. 8/18; patient presents for follow-up. Epicord #1 was  placed in standard fashion last clinic visit. She has no issues or complaints today. 9/18; Unfortunately patient has missed her last clinic appointments due to falling and breaking her right ankle. We have been following her for her left ankle wound. She has also developed a large blister to the left heel over the past week that has ruptured and dried. She currently resides In [facility]. Wound9/25; patient presents for follow-up. We have been using Hydrofera Blue and Santyl to the original left ankle wound and Xeroform to the dried blistered. We have been wrapping her with compression therapy. She resides in a facility and they changed the wrap once this past week. She has no issues or complaints today. She denies signs of infection. 10/3; patient presents for follow-up. We have been using Xeroform to the heel wound and Epicort to the left ankle wound All under compression therapy. She has no issues or complaints today. 10/10; patient presents for follow-up. We have been using Epicort to the left ankle wound and Santyl and Hydrofera Blue to the heel wound. All under compression therapy. she has no issues or complaints today. 10/16; patient presents for follow-up. Epicort was placed in standard fashion to the superior wound and onto the heel and Santyl and Hydrofera Blue. She states that the wrap got wet during her shower and her facility was able to rewrap with Kerlix/Coban. 10/23; patient presents for follow-up. Epicord was placed to the wound beds last clinic visit. We continue Kerlix/Coban. She has no issues or complaints today. 10/31; Patient presents for follow-up. Epicord was placed to the wound beds last clinic visit. The original wound is almost healed. We have done this under Kerlix/Coban. She denies signs of infection. 11/10; patient presents for follow-up. Patient's last epi cord was placed in standard fashion at last clinic visit. The original wound has healed. She still has a  heel wound. Grafix was approved however too costly for the patient. We will rerun epi cord to see if she can have more applications. She had done  very well with this. We have been using Hydrofera Blue to the heel under Kerlix/Coban. 11/21; patient presents for follow-up. We have been using Hydrofera Blue and Santyl to the heel under Kerlix/Coban. She switched to a new healthcare insurance and again The skin substitute is too costly for the patient. She denies signs of infection. 11/28; patient presents for follow-up. Her facility did not change the wrap last clinic visit due to lack of staffing. The wrap was taken off and Hydrofera Blue dressing has been in place for the past week. She has no issues or complaints today. 12/5; patient presents for follow-up. She states she has had more drainage over the past week. They did not change the wrap at her facility. She states the nurse will be back this week. She is also been doing a lot of physical therapy. She has been using the Prevalon boot while in bed. 12/12; patient presents for follow-up. We have been using Hydrofera Blue with antibiotic ointment under 4-layer compression. She is tolerated the wrap well. She states she is using her Prevalon boot while in the wheelchair. She has no issues or complaints today. There is been improvement in wound healing. 12/19; patient presents for follow-up. We have been using Hydrofera Blue and antibiotic ointment to the wound bed under 4-layer compression. Her facility change the wrap once. She states she has been using the Prevalon boot in bed but not in the wheelchair due to issues with transferring. Overall there is still [name], [name] ([id number]) [phone].pdf Page 3 of 10 improvement in wound healing. 1/16; patient presents for follow-up. She missed her last clinic appointment. We have been using Hydrofera Blue and antibiotic ointment under 4-layer compression. At the facility this  has only been changed twice over the past 3 weeks. They are not using 4-layer compression. She came in with Kerlix/Coban. 1/23; patient presents for follow-up. We have been using Santyl and Hydrofera Blue under 4-layer compression. Patient has no issues or complaints today. 2/1; patient presents for follow-up. Patient's been using Santyl and Hydrofera Blue to the wound bed. She has developed more slough and now a mild odor. She states she is wearing the Prevalon boot at night. It is unclear if she is offloading this area during the day. 2/13; patient presents for follow-up. Patient's been using Dakin's wet-to-dry dressings. She received her Keystone antibiotic ointment in the mail. She has not used this yet. She reports using her Prevalon boot. She has no issues or complaints today. 2/20; patient presents for follow-up. She has been using Keystone antibiotic ointment with Hydrofera Blue. She has no issues or complaints today. Due to transportation she cannot do the total contact cast today. She is scheduled for next week to have this placed. 2/27; patient presents for follow-up. Plan is for the total contact cast today. We have been using Keystone and Hydrofera Blue to the wound bed. She forgot her Keystone antibiotic ointment. She has no issues or complaints today. 2/29; patient presents for follow-up. Patient presents for her obligatory cast change. She has no issues or complaints today. Electronic Signature(s) Signed: 12/03/2022 4:08:18 PM By: Kalman Shan DO Entered By: Kalman Shan on 12/03/2022 16:01:45 -------------------------------------------------------------------------------- Physical Exam Details Patient Name: Date of Service: [name]. 12/03/2022 2:45 PM Medical Record Number: [id number] Patient Account Number: [id number] Date of Birth/Sex: Treating RN: 01/17/1961 (62 y.o. F) Primary Care Provider: Jenean Lindau Other Clinician: Referring Provider: Treating  Provider/Extender: Elise Benne in Treatment: 39 Constitutional respirations  regular, non-labored and within target range for patient.. Cardiovascular 2+ dorsalis pedis/posterior tibialis pulses. Psychiatric pleasant and cooperative. Notes Left foot: T the heel there is an open wound with granulation tissue and nonviable tissue. No surrounding signs of soft tissue infection. o Electronic Signature(s) Signed: 12/03/2022 4:08:18 PM By: Kalman Shan DO Entered By: Kalman Shan on 12/03/2022 16:02:06 -------------------------------------------------------------------------------- Physician Orders Details Patient Name: Date of Service: [name]. 12/03/2022 2:45 PM Medical Record Number: [id number] Patient Account Number: [id number] Date of Birth/Sex: Treating RN: [birth year] (62 y.o. [name], [name] Primary Care Provider: Jenean Lindau Other Clinician: Referring Provider: Treating Provider/Extender: Elise Benne in [id number] Verbal / Phone Orders: No Diagnosis Coding Follow-up Appointments ppointment in 1 week. - w/ Dr. Heber [place] and [place] Rm # 8 Tuesday 12/08/22 @ 8:00 (already has appt.) Return A ppointment in 2 weeks. - w/ Dr. Heber [place] and [place] rm # 8 Tuesday 12/15/22 @ 3:00 (already has appt.) Return A Other: - Leave dressing in place. Facility not to change dressing. Anesthetic (In clinic) Topical Lidocaine 5% applied to wound bed [place], Virda [name] ([id number]) [phone].pdf Page 4 of 10 Cellular or Tissue Based Products Cellular or Tissue Based Product Type: - Run IVR for EpiFix and Epicord=100% covered 05/07/22: Epicord ordered daptic or Mepitel. (DO NOT REMOVE). - Cellular or Tissue Based Product applied to wound bed, secured with steri-strips, cover with A Epicord #1 05/15/25 Epicord #2 05/22/22 Epicord #3 06/29/2022 Epicord #4 07/07/2022 Epifix #5 07/14/22 Epicord #6  07/20/22 Epicord # 7 07/27/22 ***Run for Grafix***PENDING Epicord #8 08/04/2022 Edema Control - Lymphedema / SCD / Other Elevate legs to the level of the heart or above for 30 minutes daily and/or when sitting for 3-4 times a day throughout the day. Avoid standing for long periods of time. Off-Loading Total Contact Cast to Left Lower Extremity - size 4 Additional Orders / Instructions Follow Nutritious Diet - Monitor/Control Blood Sugar Wound Treatment Wound #2 - Calcaneus Wound Laterality: Left, Distal Cleanser: Soap and Water 1 x Per Week/30 Days Discharge Instructions: May shower and wash wound with dial antibacterial soap and water prior to dressing change. Cleanser: Wound Cleanser 1 x Per Week/30 Days Discharge Instructions: Cleanse the wound with wound cleanser prior to applying a clean dressing using gauze sponges, not tissue or cotton balls. Peri-Wound Care: Zinc Oxide Ointment 30g tube 1 x Per Week/30 Days Discharge Instructions: Apply Zinc Oxide to periwound with each dressing change Topical: Keystone 1 x Per Week/30 Days Prim Dressing: PolyMem Silver Non-Adhesive Dressing, 4.25x4.25 in 1 x Per Week/30 Days ary Discharge Instructions: Apply to wound bed as instructed Prim Dressing: Santyl Ointment 1 x Per Week/30 Days ary Discharge Instructions: Apply nickel thick amount to wound bed as instructed Secondary Dressing: ABD Pad, 8x10 1 x Per Week/30 Days Discharge Instructions: Apply over primary dressing as directed. Secondary Dressing: Woven Gauze Sponge, Non-Sterile 4x4 in (Home Health) 1 x Per Week/30 Days Discharge Instructions: Apply over primary dressing as directed. Secured With: [employer], 4.5x3.1 (in/yd) 1 x Per Week/30 Days Discharge Instructions: Secure with Kerlix as directed. Electronic Signature(s) Signed: 12/03/2022 4:08:18 PM By: Kalman Shan DO Entered By: Kalman Shan on 12/03/2022  16:02:15 -------------------------------------------------------------------------------- Problem List Details Patient Name: Date of Service: [name]. 12/03/2022 2:45 PM Medical Record Number: [id number] Patient Account Number: [id number] Date of Birth/Sex: Treating RN: [birth year] (62 y.o. F) Primary Care Provider: Jenean Lindau Other Clinician: Referring Provider: Treating Provider/Extender: Elise Benne  in Treatment: 39 Active Problems ICD-10 Encounter Code Description Active Date MDM Diagnosis L97.522 Non-pressure chronic ulcer of other part of left foot with fat layer exposed 03/05/2022 No Yes [place], Naleah [name] ([id number]) [phone].pdf Page 5 of 10 E11.621 Type 2 diabetes mellitus with foot ulcer 03/05/2022 No Yes E66.01 Morbid (severe) obesity due to excess calories 03/05/2022 No Yes Inactive Problems Resolved Problems Electronic Signature(s) Signed: 12/03/2022 4:08:18 PM By: Kalman Shan DO Entered By: Kalman Shan on 12/03/2022 15:59:28 -------------------------------------------------------------------------------- Progress Note Details Patient Name: Date of Service: [name]. 12/03/2022 2:45 PM Medical Record Number: [id number] Patient Account Number: [id number] Date of Birth/Sex: Treating RN: November 14, 1960 (62 y.o. F) Primary Care Provider: Jenean Lindau Other Clinician: Referring Provider: Treating Provider/Extender: Elise Benne in Treatment: 39 Subjective Chief Complaint Information obtained from Patient 03/05/2022; left foot wound History of Present Illness (HPI) Admission 03/05/2022 Ms. Garnell Detty is a 62 year old female with a past medical history of uncontrolled insulin-dependent type 2 diabetes, tobacco user and chronic diastolic heart failure that presents to the clinic for a 11-monthhistory of wound to her left heel. She states she had a left ankle fusion  in September 2022. She states that she always had a wound after the surgery and it never healed. She has home health and they have been doing compression wraps along with silver alginate to the wound bed. She currently denies signs of infection. 6/8; this is a 62year old woman with type 2 diabetes. She developed a wound on her left Achilles heel just above the tip of the heel in the setting of recurrent ankle surgeries in late 2022. I have seen some of these results from either the cast or the surgical boots that are put on after these operations. She thinks this may be the case. And a sense of pressure ulcer. In any case that she has been using Santyl Hydrofera Blue under compression. She is not wearing any footwear at home as she cannot find anything to accommodate the wrap 6/22; patient presents for follow-up. She has home health that comes out once a week to help with dressing changes. She has no issues or complaints today. We have been using Hydrofera Blue under compression therapy. 7/6; patient presents for follow-up. She states that home health did not come out for the past 2 weeks. It is unclear why. We have been using Hydrofera Blue and Santyl under compression therapy. 7/13; patient presents for follow-up. She did not take the oral antibiotics prescribed at last clinic visit. We have been using Hydrofera Blue with gentamicin/mupirocin ointment under compression therapy. She has no issues or complaints today. She denies signs of infection. 7/20; patient presents for follow-up. We have been using Hydrofera Blue with antibiotic ointment under 3 layer compression. She has home health that change the dressing once. They put collagen on the wound bed. She also reports falling yesterday and hitting her right foot. She has no pain to the area today. 7/27; patient presents for follow-up. We have been using Hydrofera Blue under 3 layer compression. She has home health that changes the dressing. She  has no issues or complaints today. 8/3; patient presents for follow-up. We continues to use Hydrofera Blue under 3 layer compression. She has no issues or complaints today. She has been approved for Epicord at 100%. 8/11; patient presents for follow-up. We have been using Hydrofera Blue under 3 layer compression. She has been approved for Epicord and we have this today. She is in agreement  with having this applied. 8/18; patient presents for follow-up. Epicord #1 was placed in standard fashion last clinic visit. She has no issues or complaints today. 9/18; Unfortunately patient has missed her last clinic appointments due to falling and breaking her right ankle. We have been following her for her left ankle wound. She has also developed a large blister to the left heel over the past week that has ruptured and dried. She currently resides In [facility]. Wound9/25; patient presents for follow-up. We have been using Hydrofera Blue and Santyl to the original left ankle wound and Xeroform to the dried blistered. We have been wrapping her with compression therapy. She resides in a facility and they changed the wrap once this past week. She has no issues or complaints today. She denies signs of infection. 10/3; patient presents for follow-up. We have been using Xeroform to the heel wound and Epicort to the left ankle wound All under compression therapy. She has no issues or complaints today. [name], [name] ([id number]) [id number].pdf Page 6 of 10 10/10; patient presents for follow-up. We have been using Epicort to the left ankle wound and Santyl and Hydrofera Blue to the heel wound. All under compression therapy. she has no issues or complaints today. 10/16; patient presents for follow-up. Epicort was placed in standard fashion to the superior wound and onto the heel and Santyl and Hydrofera Blue. She states that the wrap got wet during her shower and her facility was able to  rewrap with Kerlix/Coban. 10/23; patient presents for follow-up. Epicord was placed to the wound beds last clinic visit. We continue Kerlix/Coban. She has no issues or complaints today. 10/31; Patient presents for follow-up. Epicord was placed to the wound beds last clinic visit. The original wound is almost healed. We have done this under Kerlix/Coban. She denies signs of infection. 11/10; patient presents for follow-up. Patient's last epi cord was placed in standard fashion at last clinic visit. The original wound has healed. She still has a heel wound. Grafix was approved however too costly for the patient. We will rerun epi cord to see if she can have more applications. She had done very well with this. We have been using Hydrofera Blue to the heel under Kerlix/Coban. 11/21; patient presents for follow-up. We have been using Hydrofera Blue and Santyl to the heel under Kerlix/Coban. She switched to a new healthcare insurance and again The skin substitute is too costly for the patient. She denies signs of infection. 11/28; patient presents for follow-up. Her facility did not change the wrap last clinic visit due to lack of staffing. The wrap was taken off and Hydrofera Blue dressing has been in place for the past week. She has no issues or complaints today. 12/5; patient presents for follow-up. She states she has had more drainage over the past week. They did not change the wrap at her facility. She states the nurse will be back this week. She is also been doing a lot of physical therapy. She has been using the Prevalon boot while in bed. 12/12; patient presents for follow-up. We have been using Hydrofera Blue with antibiotic ointment under 4-layer compression. She is tolerated the wrap well. She states she is using her Prevalon boot while in the wheelchair. She has no issues or complaints today. There is been improvement in wound healing. 12/19; patient presents for follow-up. We have been using  Hydrofera Blue and antibiotic ointment to the wound bed under 4-layer compression. Her facility change the wrap once.  She states she has been using the Prevalon boot in bed but not in the wheelchair due to issues with transferring. Overall there is still improvement in wound healing. 1/16; patient presents for follow-up. She missed her last clinic appointment. We have been using Hydrofera Blue and antibiotic ointment under 4-layer compression. At the facility this has only been changed twice over the past 3 weeks. They are not using 4-layer compression. She came in with Kerlix/Coban. 1/23; patient presents for follow-up. We have been using Santyl and Hydrofera Blue under 4-layer compression. Patient has no issues or complaints today. 2/1; patient presents for follow-up. Patient's been using Santyl and Hydrofera Blue to the wound bed. She has developed more slough and now a mild odor. She states she is wearing the Prevalon boot at night. It is unclear if she is offloading this area during the day. 2/13; patient presents for follow-up. Patient's been using Dakin's wet-to-dry dressings. She received her Keystone antibiotic ointment in the mail. She has not used this yet. She reports using her Prevalon boot. She has no issues or complaints today. 2/20; patient presents for follow-up. She has been using Keystone antibiotic ointment with Hydrofera Blue. She has no issues or complaints today. Due to transportation she cannot do the total contact cast today. She is scheduled for next week to have this placed. 2/27; patient presents for follow-up. Plan is for the total contact cast today. We have been using Keystone and Hydrofera Blue to the wound bed. She forgot her Keystone antibiotic ointment. She has no issues or complaints today. 2/29; patient presents for follow-up. Patient presents for her obligatory cast change. She has no issues or complaints today. Patient History Information obtained from Patient,  Chart. Family History Cancer - Father,Mother, Diabetes - Mother,Maternal Grandparents,Paternal Grandparents, Heart Disease - Siblings,Maternal Grandparents, Hypertension - Siblings, Tuberculosis - Maternal Grandparents,Paternal Grandparents. Social History Current every day smoker, Marital Status - Single, Alcohol Use - Never, Drug Use - No History, Caffeine Use - Daily. 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 Vitals Time Taken: 3:04 PM, Height: 66 in, Weight: 339 lbs, BMI: 54.7, Temperature: 98.7 F, Pulse: 74 bpm, Respiratory Rate: 17 breaths/min, Blood Pressure: 155/74 mmHg, Capillary Blood Glucose: 96 mg/dl. Cardiovascular 2+ dorsalis pedis/posterior tibialis pulses. Psychiatric pleasant and cooperative. General Notes: Left foot: T the heel there is an open wound with granulation tissue and nonviable tissue. No surrounding signs of soft tissue infection. o Integumentary (Hair, Skin) Wound #2 status is Open. Original cause of wound was Blister. The date acquired was: 06/15/2022. The wound has been in treatment 22 weeks. The wound is located on the Left,Distal Calcaneus. The wound measures 3.4cm length x 5cm width x 0.1cm depth; 13.352cm^2 area and 1.335cm^3 volume. There is  Fat Layer (Subcutaneous Tissue) exposed. There is no tunneling or undermining noted. There is a medium amount of serosanguineous drainage noted. The wound margin is distinct with the outline attached to the wound base. There is medium (34-66%) red, pink granulation within  the wound bed. There is a medium (34-66%) amount of necrotic tissue within the wound bed including Adherent Slough. The periwound skin appearance had no abnormalities noted for texture. The periwound skin appearance had no abnormalities noted for color. The periwound skin appearance did not exhibit: Dry/Scaly, Maceration. Periwound temperature was noted as No Abnormality. Assessment Active Problems ICD-10 Non-pressure chronic ulcer of other part of left foot with fat layer exposed Type 2 diabetes mellitus with foot ulcer Morbid (severe) obesity due to excess calories Patient did well with the total contact cast. She had no issues. I debrided nonviable tissue and recommended Keystone and Hydrofera Blue under the cast. Follow-up in 1 week. 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 There was a Excisional Skin/Subcutaneous Tissue Debridement with a total area of 17 sq cm performed by Kalman Shan, DO. With the following instrument(s): Curette to remove Viable and Non-Viable tissue/material. Material removed includes Subcutaneous Tissue, Slough, Skin: Dermis, and Skin: Epidermis after achieving pain control using Lidocaine. No specimens were taken. A time out was conducted at 15:15, prior to the start of the procedure. A Minimum amount of bleeding was controlled with Pressure. The procedure was tolerated well with a pain level of 0 throughout and a pain level of 0 following the procedure. Post Debridement Measurements: 3.4cm length x 5cm width x 0.1cm depth; 1.335cm^3  volume. Character of Wound/Ulcer Post Debridement is improved. Severity of Tissue Post Debridement is: Fat layer exposed. 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 There was a T Programmer, multimedia otal Procedure by Kalman Shan, DO. Post procedure Diagnosis Wound #2: Same as Pre-Procedure Plan Follow-up Appointments: Return Appointment in 1 week. - w/ Dr. Heber [place] and [place] Rm # 8 Tuesday 12/08/22 @ 8:00 (already has appt.) Return Appointment in 2 weeks. - w/ Dr. Heber [place] and [facility] rm # 8 Tuesday 12/15/22 @ 3:00 (already has appt.) Other: - Leave dressing in place. Facility not to change dressing. Anesthetic: (In clinic) Topical Lidocaine 5% applied to wound bed Cellular or Tissue Based Products: Cellular or Tissue Based Product Type: - Run IVR for EpiFix and Epicord=100% covered 05/07/22: Epicord ordered Cellular or Tissue Based Product applied to wound bed, secured with steri-strips, cover with Adaptic or Mepitel. (DO NOT REMOVE). - Epicord #1 05/15/25 Epicord #2 05/22/22 Epicord #3 06/29/2022 Epicord #4 07/07/2022 Epifix #5 07/14/22 Epicord #6 07/20/22 Epicord # 7 07/27/22 ***Run for Grafix***PENDING Epicord #8 08/04/2022 Edema Control - Lymphedema / SCD / Other: Elevate legs to the level of the heart or above for 30 minutes daily and/or when sitting for 3-4 times a day throughout the day. Avoid standing for long periods of time. Off-Loading: T Contact Cast to Left Lower Extremity - size 4 otal Additional Orders / Instructions: [name], [name] ([id number]) [phone].pdf Page 8 of 10 Follow Nutritious Diet - Monitor/Control Blood Sugar WOUND #2: - Calcaneus Wound Laterality: Left, Distal Cleanser: Soap and Water 1 x Per Week/30 Days Discharge Instructions: May shower and wash wound with dial antibacterial soap and water prior to  dressing change. Cleanser: Wound Cleanser 1 x Per Week/30 Days Discharge Instructions: Cleanse the wound with wound cleanser prior to applying a clean dressing using gauze sponges, not tissue or cotton balls. Peri-Wound Care: Zinc Oxide Ointment 30g tube 1  x Per Week/30 Days Discharge Instructions: Apply Zinc Oxide to periwound with each dressing change Topical: Keystone 1 x Per Week/30 Days Prim Dressing: PolyMem Silver Non-Adhesive Dressing, 4.25x4.25 in 1 x Per Week/30 Days ary Discharge Instructions: Apply to wound bed as instructed Prim Dressing: Santyl Ointment 1 x Per Week/30 Days ary Discharge Instructions: Apply nickel thick amount to wound bed as instructed Secondary Dressing: ABD Pad, 8x10 1 x Per Week/30 Days Discharge Instructions: Apply over primary dressing as directed. Secondary Dressing: Woven Gauze Sponge, Non-Sterile 4x4 in (Home Health) 1 x Per Week/30 Days Discharge Instructions: Apply over primary dressing as directed. Secured With: [employer], 4.5x3.1 (in/yd) 1 x Per Week/30 Days Discharge Instructions: Secure with Kerlix as directed. 1. In office sharp debridement 2. Hydrofera Blue and Keystone antibiotic ointment 3. T contact cast placed in standard fashion - Left lower extremity otal 4. Follow-up in 1 week Electronic Signature(s) Signed: 12/03/2022 4:08:18 PM By: Kalman Shan DO Entered By: Kalman Shan on 12/03/2022 16:03:20 -------------------------------------------------------------------------------- HxROS Details Patient Name: Date of Service: [name]. 12/03/2022 2:45 PM Medical Record Number: [id number] Patient Account Number: [id number] Date of Birth/Sex: Treating RN: 11-25-1960 (62 y.o. F) Primary Care Provider: Jenean Lindau Other Clinician: Referring Provider: Treating Provider/Extender: Elise Benne in Treatment: 39 Information Obtained From Patient Chart Hematologic/Lymphatic Medical  History: Positive for: Anemia Respiratory Medical History: Positive for: Chronic Obstructive Pulmonary Disease (COPD) Cardiovascular Medical History: Positive for: Congestive Heart Failure - Weighs self daily; Hypertension; Myocardial Infarction; Peripheral Venous Disease Gastrointestinal Medical History: Past Medical History Notes: GERD, Peptic Ulcer Disease Endocrine Medical History: Positive for: Type II Diabetes Time with diabetes: 16 years Treated with: Insulin, Oral agents Blood sugar tested every day: Yes Tested : Twice daily Musculoskeletal Medical History: Past Medical History Notes: [name], [name] ([id number]) [id number] Page 9 of 10 Broke left leg 3 years ago, 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, foot fused Neurologic Medical History: Positive for: Neuropathy Psychiatric Medical History: Past Medical History Notes: Depression Immunizations Pneumococcal Vaccine: Received Pneumococcal Vaccination: No Implantable Devices None Family and Social History Cancer: Yes - Father,Mother; Diabetes: Yes - Mother,Maternal Grandparents,Paternal Grandparents; Heart Disease: Yes - Siblings,Maternal Grandparents; Hypertension: Yes - Siblings; Tuberculosis: Yes - Maternal Grandparents,Paternal Grandparents; Current every day smoker; Marital Status - Single; Alcohol Use: Never; Drug Use: No History; Caffeine Use: Daily; Financial Concerns: No; Food, Clothing or Shelter Needs: No; Support System Lacking: No; Transportation Concerns: No Electronic Signature(s) Signed: 12/03/2022 4:08:18 PM By: Kalman Shan DO Entered By: Kalman Shan on 12/03/2022 16:01:51 -------------------------------------------------------------------------------- Total Contact Cast Details Patient Name: Date of Service: [name]. 12/03/2022 2:45 PM Medical Record Number: [id number] Patient Account Number: [id number] Date of Birth/Sex: Treating RN: Apr 13, 1961 (62 y.o. [name], [name] Primary Care Provider: Jenean Lindau Other Clinician: Referring Provider: Treating Provider/Extender: Elise Benne in [id number] T Contact Cast Applied for Wound Assessment: otal Wound #2 Left,Distal Calcaneus Performed By: Physician Kalman Shan, DO Post Procedure Diagnosis Same as Pre-procedure Electronic Signature(s) Signed: 12/03/2022 4:08:18 PM By: Kalman Shan DO Signed: 12/03/2022 4:19:23 PM By: Rhae Hammock RN Entered By: Rhae [name] on 12/03/2022 15:09:22 -------------------------------------------------------------------------------- SuperBill Details Patient Name: Date of Service: [name]. 12/03/2022 Medical Record Number: [id number] Patient Account Number: [id number] Date of Birth/Sex: Treating RN: [birth year] (62 y.o. [name], [name] Primary Care Provider: Jenean Lindau Other Clinician: Referring Provider: Treating Provider/Extender: Elise Benne in Treatment: 39 Diagnosis Coding ICD-10 Codes Code Description [phone]  Non-pressure chronic ulcer of other part of left foot with fat layer exposed E11.621 Type 2 diabetes mellitus with foot ulcer E66.01 Morbid (severe) obesity due to excess calories [name], [name] ([id number]) [phone].pdf Page 10 of 10 Facility Procedures : CPT4 Code: [id number] Description: [id number] - DEB SUBQ TISSUE 20 SQ CM/< ICD-10 Diagnosis Description L97.522 Non-pressure chronic ulcer of other part of left foot with fat layer exposed E11.621 Type 2 diabetes mellitus with foot ulcer Modifier: Quantity: 1 Physician Procedures : CPT4 Code Description Modifier [id number] - WC PHYS SUBQ TISS 20 SQ CM ICD-10 Diagnosis Description L97.522 Non-pressure chronic ulcer of other part of left foot with fat layer exposed E11.621 Type 2 diabetes mellitus with foot ulcer Quantity: 1 Electronic Signature(s) Signed:  12/03/2022 4:08:18 PM By: Kalman Shan DO Entered By: Kalman Shan on 12/03/2022 16:03:33

## 2022-12-08 ENCOUNTER — Encounter (HOSPITAL_BASED_OUTPATIENT_CLINIC_OR_DEPARTMENT_OTHER): Payer: 59 | Attending: Internal Medicine | Admitting: Internal Medicine

## 2022-12-08 DIAGNOSIS — I5032 Chronic diastolic (congestive) heart failure: Secondary | ICD-10-CM | POA: Insufficient documentation

## 2022-12-08 DIAGNOSIS — E11621 Type 2 diabetes mellitus with foot ulcer: Secondary | ICD-10-CM | POA: Diagnosis not present

## 2022-12-08 DIAGNOSIS — E1140 Type 2 diabetes mellitus with diabetic neuropathy, unspecified: Secondary | ICD-10-CM | POA: Insufficient documentation

## 2022-12-08 DIAGNOSIS — I110 Hypertensive heart disease with heart failure: Secondary | ICD-10-CM | POA: Insufficient documentation

## 2022-12-08 DIAGNOSIS — Z6841 Body Mass Index (BMI) 40.0 and over, adult: Secondary | ICD-10-CM | POA: Diagnosis not present

## 2022-12-08 DIAGNOSIS — L97522 Non-pressure chronic ulcer of other part of left foot with fat layer exposed: Secondary | ICD-10-CM | POA: Insufficient documentation

## 2022-12-08 DIAGNOSIS — Z794 Long term (current) use of insulin: Secondary | ICD-10-CM | POA: Insufficient documentation

## 2022-12-10 NOTE — Progress Notes (Signed)
[name] ([id number]) [id number] Page 1 of 10 Visit Report for 12/08/2022 Chief Complaint Document Details Patient Name: Date of Service: [name]. 12/08/2022 8:00 [name] Medical Record Number: [id number] Patient Account Number: [id number] Date of Birth/Sex: Treating RN: 10-06-1960 (63 y.o. F) Primary Care Provider: Jenean Lindau Other Clinician: Referring Provider: Treating Provider/Extender: Elise Benne in [id number] Information Obtained from: Patient Chief Complaint 03/05/2022; left foot wound Electronic Signature(s) Signed: 12/08/2022 12:10:40 PM By: Kalman Shan DO Entered By: Kalman Shan on 12/08/2022 08:41:59 -------------------------------------------------------------------------------- Debridement Details Patient Name: Date of Service: [name]. 12/08/2022 8:00 [name] Medical Record Number: [id number] Patient Account Number: [id number] Date of Birth/Sex: Treating RN: 04-09-1961 (62 y.o. [name], [name] Primary Care Provider: Jenean Lindau Other Clinician: Referring Provider: Treating Provider/Extender: Elise Benne in Treatment: 39 Debridement Performed for Assessment: Wound #2 Left,Distal Calcaneus Performed By: Physician Kalman Shan, DO Debridement Type: Debridement Severity of Tissue Pre Debridement: Fat layer exposed Level of Consciousness (Pre-procedure): Awake and Alert Pre-procedure Verification/Time Out Yes - 08:35 Taken: Start Time: 08:36 Pain Control: Lidocaine 4% T opical Solution T Area Debrided (L x W): otal 3.4 (cm) x 5 (cm) = 17 (cm) Tissue and other material debrided: Viable, Non-Viable, Slough, Subcutaneous, Skin: Dermis , Skin: Epidermis, Slough Level: Skin/Subcutaneous Tissue Debridement Description: Excisional Instrument: Curette Bleeding: Minimum Hemostasis Achieved: Pressure End Time: 08:38 Procedural Pain: 0 Post  Procedural Pain: 0 Response to Treatment: Procedure was tolerated well Level of Consciousness (Post- Awake and Alert procedure): Post Debridement Measurements of Total Wound Length: (cm) 3.4 Width: (cm) 5 Depth: (cm) 0.1 Volume: (cm) 1.335 Character of Wound/Ulcer Post Debridement: Improved Severity of Tissue Post Debridement: Fat layer exposed Post Procedure Diagnosis Same as Pre-procedure Electronic Signature(s) Signed: 12/08/2022 12:10:40 PM By: Kalman Shan DO Signed: 12/08/2022 5:29:51 PM By: Deon Pilling RN, BSN Entered By: Deon Pilling on 12/08/2022 08:38:38 Stgermaine, [name] ([id number].pdf Page 2 of 10 -------------------------------------------------------------------------------- HPI Details Patient Name: Date of Service: [name]. 12/08/2022 8:00 [name] Medical Record Number: [id number] Patient Account Number: [id number] Date of Birth/Sex: Treating RN: 1961-02-11 (62 y.o. F) Primary Care Provider: Jenean Lindau Other Clinician: Referring Provider: Treating Provider/Extender: Elise Benne in Treatment: 39 History of Present Illness HPI Description: Admission 03/05/2022 [name] is [name] 62 year old female with [name] past medical history of uncontrolled insulin-dependent type 2 diabetes, tobacco user and chronic diastolic heart failure that presents to the clinic for [name] 27-monthhistory of wound to her left heel. She states she had [name] left ankle fusion in September 2022. She states that she always had [name] wound after the surgery and it never healed. She has home health and they have been doing compression wraps along with silver alginate to the wound bed. She currently denies signs of infection. 6/8; this is [name] 62year old woman with type 2 diabetes. She developed [name] wound on her left Achilles heel just above the tip of the heel in the setting of recurrent ankle surgeries in late 2022. I have seen some of these  results from either the cast or the surgical boots that are put on after these operations. She thinks this may be the case. And [name] sense of pressure ulcer. In any case that she has been using Santyl Hydrofera [name] under compression. She is not wearing any footwear at home as she cannot find anything to accommodate the wrap 6/22; patient presents for follow-up. She  has home health that comes out once [name] week to help with dressing changes. She has no issues or complaints today. We have been using Hydrofera [name] under compression therapy. 7/6; patient presents for follow-up. She states that home health did not come out for the past 2 weeks. It is unclear why. We have been using Hydrofera [name] and Santyl under compression therapy. 7/13; patient presents for follow-up. She did not take the oral antibiotics prescribed at last clinic visit. We have been using Hydrofera [name] with gentamicin/mupirocin ointment under compression therapy. She has no issues or complaints today. She denies signs of infection. 7/20; patient presents for follow-up. We have been using Hydrofera [name] with antibiotic ointment under 3 layer compression. She has home health that change the dressing once. They put collagen on the wound bed. She also reports falling yesterday and hitting her right foot. She has no pain to the area today. 7/27; patient presents for follow-up. We have been using Hydrofera [name] under 3 layer compression. She has home health that changes the dressing. She has no issues or complaints today. 8/3; patient presents for follow-up. We continues to use Hydrofera [name] under 3 layer compression. She has no issues or complaints today. She has been approved for Epicord at 100%. 8/11; patient presents for follow-up. We have been using Hydrofera [name] under 3 layer compression. She has been approved for Epicord and we have this today. She is in agreement with having this applied. 8/18; patient presents for follow-up.  Epicord #1 was placed in standard fashion last clinic visit. She has no issues or complaints today. 9/18; Unfortunately patient has missed her last clinic appointments due to falling and breaking her right ankle. We have been following her for her left ankle wound. She has also developed [name] large blister to the left heel over the past week that has ruptured and dried. She currently resides In [facility]. Wound9/25; patient presents for follow-up. We have been using Hydrofera [name] and Santyl to the original left ankle wound and Xeroform to the dried blistered. We have been wrapping her with compression therapy. She resides in [name] facility and they changed the wrap once this past week. She has no issues or complaints today. She denies signs of infection. 10/3; patient presents for follow-up. We have been using Xeroform to the heel wound and Epicort to the left ankle wound All under compression therapy. She has no issues or complaints today. 10/10; patient presents for follow-up. We have been using Epicort to the left ankle wound and Santyl and Hydrofera [name] to the heel wound. All under compression therapy. she has no issues or complaints today. 10/16; patient presents for follow-up. Epicort was placed in standard fashion to the superior wound and onto the heel and Santyl and Hydrofera [name]. She states that the wrap got wet during her shower and her facility was able to rewrap with Kerlix/Coban. 10/23; patient presents for follow-up. Epicord was placed to the wound beds last clinic visit. We continue Kerlix/Coban. She has no issues or complaints today. 10/31; Patient presents for follow-up. Epicord was placed to the wound beds last clinic visit. The original wound is almost healed. We have done this under Kerlix/Coban. She denies signs of infection. 11/10; patient presents for follow-up. Patient's last epi cord was placed in standard fashion at last clinic visit. The original wound has healed. She  still has [name] heel wound. Grafix was approved however too costly for the patient. We will rerun epi cord to see if  she can have more applications. She had done very well with this. We have been using Hydrofera [name] to the heel under Kerlix/Coban. 11/21; patient presents for follow-up. We have been using Hydrofera [name] and Santyl to the heel under Kerlix/Coban. She switched to [name] new healthcare insurance and again The skin substitute is too costly for the patient. She denies signs of infection. 11/28; patient presents for follow-up. Her facility did not change the wrap last clinic visit due to lack of staffing. The wrap was taken off and Hydrofera [name] dressing has been in place for the past week. She has no issues or complaints today. 12/5; patient presents for follow-up. She states she has had more drainage over the past week. They did not change the wrap at her facility. She states the nurse will be back this week. She is also been doing [name] lot of physical therapy. She has been using the Prevalon boot while in bed. 12/12; patient presents for follow-up. We have been using Hydrofera [name] with antibiotic ointment under 4-layer compression. She is tolerated the wrap well. She states she is using her Prevalon boot while in the wheelchair. She has no issues or complaints today. There is been improvement in wound healing. 12/19; patient presents for follow-up. We have been using Hydrofera [name] and antibiotic ointment to the wound bed under 4-layer compression. Her facility change the wrap once. She states she has been using the Prevalon boot in bed but not in the wheelchair due to issues with transferring. Overall there is still [name], [name] ([id number]) [id number].pdf Page 3 of 10 improvement in wound healing. 1/16; patient presents for follow-up. She missed her last clinic appointment. We have been using Hydrofera [name] and antibiotic ointment under 4-layer compression. At the  facility this has only been changed twice over the past 3 weeks. They are not using 4-layer compression. She came in with Kerlix/Coban. 1/23; patient presents for follow-up. We have been using Santyl and Hydrofera [name] under 4-layer compression. Patient has no issues or complaints today. 2/1; patient presents for follow-up. Patient's been using Santyl and Hydrofera [name] to the wound bed. She has developed more slough and now [name] mild odor. She states she is wearing the Prevalon boot at night. It is unclear if she is offloading this area during the day. 2/13; patient presents for follow-up. Patient's been using Dakin's wet-to-dry dressings. She received her Keystone antibiotic ointment in the mail. She has not used this yet. She reports using her Prevalon boot. She has no issues or complaints today. 2/20; patient presents for follow-up. She has been using Keystone antibiotic ointment with Hydrofera [name]. She has no issues or complaints today. Due to transportation she cannot do the total contact cast today. She is scheduled for next week to have this placed. 2/27; patient presents for follow-up. Plan is for the total contact cast today. We have been using Keystone and Hydrofera [name] to the wound bed. She forgot her Keystone antibiotic ointment. She has no issues or complaints today. 2/29; patient presents for follow-up. Patient presents for her obligatory cast change. She has no issues or complaints today. 3/5; patient presents for follow-up. She has had the cast in place for the past week and has tolerated this well. She has no issues or complaints today. We have been using PolyMem with Keystone antibiotic ointment. Electronic Signature(s) Signed: 12/08/2022 12:10:40 PM By: Kalman Shan DO Entered By: Kalman Shan on 12/08/2022 08:42:31 -------------------------------------------------------------------------------- Physical Exam Details Patient Name: Date of Service: [name],  [name].  12/08/2022 8:00 [name] Medical Record Number: [id number] Patient Account Number: [id number] Date of Birth/Sex: Treating RN: 06/14/1961 (62 y.o. F) Primary Care Provider: Jenean Lindau Other Clinician: Referring Provider: Treating Provider/Extender: Elise Benne in Treatment: 39 Constitutional respirations regular, non-labored and within target range for patient.. Cardiovascular 2+ dorsalis pedis/posterior tibialis pulses. Psychiatric pleasant and cooperative. Notes Left foot: T the heel there is an open wound with granulation tissue and nonviable tissue. No surrounding signs of soft tissue infection. o Electronic Signature(s) Signed: 12/08/2022 12:10:40 PM By: Kalman Shan DO Entered By: Kalman Shan on 12/08/2022 08:42:52 -------------------------------------------------------------------------------- Physician Orders Details Patient Name: Date of Service: [name], [name]. 12/08/2022 8:00 [name] Medical Record Number: [id number] Patient Account Number: [id number] Date of Birth/Sex: Treating RN: 12/16/1960 (62 y.o. [name], [name] Primary Care Provider: Jenean Lindau Other Clinician: Referring Provider: Treating Provider/Extender: Elise Benne in Treatment: 10 Verbal / Phone Orders: No Diagnosis Coding ICD-10 Coding Code Description [phone] Non-pressure chronic ulcer of other part of left foot with fat layer exposed E11.621 Type 2 diabetes mellitus with foot ulcer E66.01 Morbid (severe) obesity due to excess calories [name], [name] ([id number]) [id number].pdf Page 4 of 10 Follow-up Appointments ppointment in 1 week. - w/ Dr. Heber [place] and bobbi rm # 8 Tuesday 12/15/22 @ 3:00 (already has appt.) Return [name] ppointment in 2 weeks. - w/ Dr. Heber White and bobbi rm # 8 Tuesday 12/22/22 0800 (already has appt.) Return [name] Other: - Leave dressing in place. Facility not to change  dressing. Anesthetic (In clinic) Topical Lidocaine 5% applied to wound bed Cellular or Tissue Based Products Cellular or Tissue Based Product Type: - Run IVR for EpiFix and Epicord=100% covered 05/07/22: Epicord ordered 12/08/2022: [employer] insurance for epicord and theraskin. daptic or Mepitel. (DO NOT REMOVE). - Cellular or Tissue Based Product applied to wound bed, secured with steri-strips, cover with [name] Epicord #1 05/15/25 Epicord #2 05/22/22 Epicord #3 06/29/2022 Epicord #4 07/07/2022 Epifix #5 07/14/22 Epicord #6 07/20/22 Epicord # 7 07/27/22 ***Run for Grafix***PENDING Epicord #8 08/04/2022 Edema Control - Lymphedema / SCD / Other Elevate legs to the level of the heart or above for 30 minutes daily and/or when sitting for 3-4 times [name] day throughout the day. Avoid standing for long periods of time. Off-Loading Total Contact Cast to Left Lower Extremity - size 4 Additional Orders / Instructions Follow Nutritious Diet - Monitor/Control Blood Sugar Wound Treatment Wound #2 - Calcaneus Wound Laterality: Left, Distal Cleanser: Soap and Water 1 x Per Week/30 Days Discharge Instructions: May shower and wash wound with dial antibacterial soap and water prior to dressing change. Cleanser: Wound Cleanser 1 x Per Week/30 Days Discharge Instructions: Cleanse the wound with wound cleanser prior to applying [name] clean dressing using gauze sponges, not tissue or cotton balls. Peri-Wound Care: Zinc Oxide Ointment 30g tube 1 x Per Week/30 Days Discharge Instructions: Apply Zinc Oxide to periwound with each dressing change Topical: Keystone 1 x Per Week/30 Days Prim Dressing: PolyMem Silver Non-Adhesive Dressing, 4.25x4.25 in 1 x Per Week/30 Days ary Discharge Instructions: Apply to wound bed as instructed Prim Dressing: Santyl Ointment 1 x Per Week/30 Days ary Discharge Instructions: Apply nickel thick amount to wound bed as instructed Secondary Dressing: ABD Pad, 8x10 1 x Per Week/30 Days Discharge  Instructions: Apply over primary dressing as directed. Secondary Dressing: Woven Gauze Sponge, Non-Sterile 4x4 in (Home Health) 1 x Per Week/30 Days Discharge Instructions: Apply over primary dressing as  directed. Secured With: [employer], 4.5x3.1 (in/yd) 1 x Per Week/30 Days Discharge Instructions: Secure with Kerlix as directed. Electronic Signature(s) Signed: 12/08/2022 12:10:40 PM By: Kalman Shan DO Entered By: Kalman Shan on 12/08/2022 08:43:05 -------------------------------------------------------------------------------- Problem List Details Patient Name: Date of Service: [name], [name]. 12/08/2022 8:00 [name] Medical Record Number: [id number] Patient Account Number: [id number] Date of Birth/Sex: Treating RN: [birth year] (62 y.o. [name], [name] Primary Care Provider: Jenean Lindau Other Clinician: Referring Provider: Treating Provider/Extender: Waldron Session [place], [place] [name] ([id number]) [phone].pdf Page 5 of 10 Weeks in Treatment: 39 Active Problems ICD-10 Encounter Code Description Active Date MDM Diagnosis L97.522 Non-pressure chronic ulcer of other part of left foot with fat layer exposed 03/05/2022 No Yes E11.621 Type 2 diabetes mellitus with foot ulcer 03/05/2022 No Yes E66.01 Morbid (severe) obesity due to excess calories 03/05/2022 No Yes Inactive Problems Resolved Problems Electronic Signature(s) Signed: 12/08/2022 12:10:40 PM By: Kalman Shan DO Entered By: Kalman Shan on 12/08/2022 08:41:23 -------------------------------------------------------------------------------- Progress Note Details Patient Name: Date of Service: [name]. 12/08/2022 8:00 [name] Medical Record Number: [id number] Patient Account Number: [id number] Date of Birth/Sex: Treating RN: 11-Apr-1961 (62 y.o. F) Primary Care Provider: Jenean Lindau Other Clinician: Referring Provider: Treating Provider/Extender: Elise Benne in Treatment: 39 Subjective Chief Complaint Information obtained from Patient 03/05/2022; left foot wound History of Present Illness (HPI) Admission 03/05/2022 Ms. Julieth Hellard is [name] 62 year old female with [name] past medical history of uncontrolled insulin-dependent type 2 diabetes, tobacco user and chronic diastolic heart failure that presents to the clinic for [name] 67-monthhistory of wound to her left heel. She states she had [name] left ankle fusion in September 2022. She states that she always had [name] wound after the surgery and it never healed. She has home health and they have been doing compression wraps along with silver alginate to the wound bed. She currently denies signs of infection. 6/8; this is [name] 62year old woman with type 2 diabetes. She developed [name] wound on her left Achilles heel just above the tip of the heel in the setting of recurrent ankle surgeries in late 2022. I have seen some of these results from either the cast or the surgical boots that are put on after these operations. She thinks this may be the case. And [name] sense of pressure ulcer. In any case that she has been using Santyl Hydrofera [name] under compression. She is not wearing any footwear at home as she cannot find anything to accommodate the wrap 6/22; patient presents for follow-up. She has home health that comes out once [name] week to help with dressing changes. She has no issues or complaints today. We have been using Hydrofera [name] under compression therapy. 7/6; patient presents for follow-up. She states that home health did not come out for the past 2 weeks. It is unclear why. We have been using Hydrofera [name] and Santyl under compression therapy. 7/13; patient presents for follow-up. She did not take the oral antibiotics prescribed at last clinic visit. We have been using Hydrofera [name] with gentamicin/mupirocin ointment under compression therapy. She has no issues or complaints today. She denies  signs of infection. 7/20; patient presents for follow-up. We have been using Hydrofera [name] with antibiotic ointment under 3 layer compression. She has home health that change the dressing once. They put collagen on the wound bed. She also reports falling yesterday and hitting her right foot. She has no pain to the area  today. 7/27; patient presents for follow-up. We have been using Hydrofera [name] under 3 layer compression. She has home health that changes the dressing. She has no issues or complaints today. 8/3; patient presents for follow-up. We continues to use Hydrofera [name] under 3 layer compression. She has no issues or complaints today. She has been approved for Epicord at 100%. 8/11; patient presents for follow-up. We have been using Hydrofera [name] under 3 layer compression. She has been approved for Epicord and we have this today. She is in agreement with having this applied. [name], [name] ([id number]) [id number].pdf Page 6 of 10 8/18; patient presents for follow-up. Epicord #1 was placed in standard fashion last clinic visit. She has no issues or complaints today. 9/18; Unfortunately patient has missed her last clinic appointments due to falling and breaking her right ankle. We have been following her for her left ankle wound. She has also developed [name] large blister to the left heel over the past week that has ruptured and dried. She currently resides In [facility]. Wound9/25; patient presents for follow-up. We have been using Hydrofera [name] and Santyl to the original left ankle wound and Xeroform to the dried blistered. We have been wrapping her with compression therapy. She resides in [name] facility and they changed the wrap once this past week. She has no issues or complaints today. She denies signs of infection. 10/3; patient presents for follow-up. We have been using Xeroform to the heel wound and Epicort to the left ankle wound All under compression therapy.  She has no issues or complaints today. 10/10; patient presents for follow-up. We have been using Epicort to the left ankle wound and Santyl and Hydrofera [name] to the heel wound. All under compression therapy. she has no issues or complaints today. 10/16; patient presents for follow-up. Epicort was placed in standard fashion to the superior wound and onto the heel and Santyl and Hydrofera [name]. She states that the wrap got wet during her shower and her facility was able to rewrap with Kerlix/Coban. 10/23; patient presents for follow-up. Epicord was placed to the wound beds last clinic visit. We continue Kerlix/Coban. She has no issues or complaints today. 10/31; Patient presents for follow-up. Epicord was placed to the wound beds last clinic visit. The original wound is almost healed. We have done this under Kerlix/Coban. She denies signs of infection. 11/10; patient presents for follow-up. Patient's last epi cord was placed in standard fashion at last clinic visit. The original wound has healed. She still has [name] heel wound. Grafix was approved however too costly for the patient. We will rerun epi cord to see if she can have more applications. She had done very well with this. We have been using Hydrofera [name] to the heel under Kerlix/Coban. 11/21; patient presents for follow-up. We have been using Hydrofera [name] and Santyl to the heel under Kerlix/Coban. She switched to [name] new healthcare insurance and again The skin substitute is too costly for the patient. She denies signs of infection. 11/28; patient presents for follow-up. Her facility did not change the wrap last clinic visit due to lack of staffing. The wrap was taken off and Hydrofera [name] dressing has been in place for the past week. She has no issues or complaints today. 12/5; patient presents for follow-up. She states she has had more drainage over the past week. They did not change the wrap at her facility. She states the nurse will be back  this week. She is also been doing  [name] lot of physical therapy. She has been using the Prevalon boot while in bed. 12/12; patient presents for follow-up. We have been using Hydrofera [name] with antibiotic ointment under 4-layer compression. She is tolerated the wrap well. She states she is using her Prevalon boot while in the wheelchair. She has no issues or complaints today. There is been improvement in wound healing. 12/19; patient presents for follow-up. We have been using Hydrofera [name] and antibiotic ointment to the wound bed under 4-layer compression. Her facility change the wrap once. She states she has been using the Prevalon boot in bed but not in the wheelchair due to issues with transferring. Overall there is still improvement in wound healing. 1/16; patient presents for follow-up. She missed her last clinic appointment. We have been using Hydrofera [name] and antibiotic ointment under 4-layer compression. At the facility this has only been changed twice over the past 3 weeks. They are not using 4-layer compression. She came in with Kerlix/Coban. 1/23; patient presents for follow-up. We have been using Santyl and Hydrofera [name] under 4-layer compression. Patient has no issues or complaints today. 2/1; patient presents for follow-up. Patient's been using Santyl and Hydrofera [name] to the wound bed. She has developed more slough and now [name] mild odor. She states she is wearing the Prevalon boot at night. It is unclear if she is offloading this area during the day. 2/13; patient presents for follow-up. Patient's been using [employer]'s wet-to-dry dressings. She received her Keystone antibiotic ointment in the mail. She has not used this yet. She reports using her Prevalon boot. She has no issues or complaints today. 2/20; patient presents for follow-up. She has been using Keystone antibiotic ointment with Hydrofera [name]. She has no issues or complaints today. Due to transportation she cannot do the total  contact cast today. She is scheduled for next week to have this placed. 2/27; patient presents for follow-up. Plan is for the total contact cast today. We have been using Keystone and Hydrofera [name] to the wound bed. She forgot her Keystone antibiotic ointment. She has no issues or complaints today. 2/29; patient presents for follow-up. Patient presents for her obligatory cast change. She has no issues or complaints today. 3/5; patient presents for follow-up. She has had the cast in place for the past week and has tolerated this well. She has no issues or complaints today. We have been using PolyMem with Keystone antibiotic ointment. Patient History Information obtained from Patient, Chart. Family History Cancer - Father,Mother, Diabetes - Mother,Maternal Grandparents,Paternal Grandparents, Heart Disease - Siblings,Maternal Grandparents, Hypertension - Siblings, Tuberculosis - Maternal Grandparents,Paternal Grandparents. Social History Current every day smoker, Marital Status - Single, Alcohol Use - Never, Drug Use - No History, Caffeine Use - Daily. 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 Vitals Time Taken: 8:10 AM, Height: 66 in, Weight: 339 lbs, BMI: 54.7, Temperature: 98.2 F,  Pulse: 89 bpm, Respiratory Rate: 20 breaths/min, Blood Pressure: 148/73 mmHg, Capillary Blood Glucose: 149 mg/dl. Cardiovascular 2+ dorsalis  pedis/posterior tibialis pulses. Psychiatric pleasant and cooperative. General Notes: Left foot: T the heel there is an open wound with granulation tissue and nonviable tissue. No surrounding signs of soft tissue infection. o Integumentary (Hair, Skin) Wound #2 status is Open. Original cause of wound was Blister. The date acquired was: 06/15/2022. The wound has been in treatment 23 weeks. The wound is located on the Left,Distal Calcaneus. The wound measures 3.4cm length x 5cm width x 0.1cm depth; 13.352cm^2 area and 1.335cm^3 volume. There is Fat Layer (Subcutaneous Tissue) exposed. There is no tunneling or undermining noted. There is [name] medium amount of serosanguineous drainage noted. The wound margin is distinct with the outline attached to the wound base. There is large (67-100%) red, pink granulation within the wound bed. There is [name] small (1-33%) amount of necrotic tissue within the wound bed including Adherent Slough. The periwound skin appearance had no abnormalities noted for texture. The periwound skin appearance had no abnormalities noted for color. The periwound skin appearance did not exhibit: Dry/Scaly, Maceration. Periwound temperature was noted as No Abnormality. Assessment Active Problems ICD-10 Non-pressure chronic ulcer of other part of left foot with fat layer exposed Type 2 diabetes mellitus with foot ulcer Morbid (severe) obesity due to excess calories Patient's wound appears well-healing. There is more granulation tissue present today. I debrided nonviable tissue. I recommended continuing the course with PolyMem and Keystone antibiotic ointment under the total contact cast. Follow-up in 1 week. Procedures Wound #2 Pre-procedure diagnosis of Wound #2 is [name] Diabetic Wound/Ulcer of the Lower Extremity located on the Left,Distal Calcaneus  .Severity of Tissue Pre Debridement is: Fat layer exposed. There was [name] Excisional Skin/Subcutaneous Tissue Debridement with [name] total area of 17 sq cm performed by Kalman Shan, DO. With the following instrument(s): Curette to remove Viable and Non-Viable tissue/material. Material removed includes Subcutaneous Tissue, Slough, Skin: Dermis, and Skin: Epidermis after achieving pain control using Lidocaine 4% Topical Solution. [name] time out was conducted at 08:35, prior to the start of the procedure. [name] Minimum amount of bleeding was controlled with Pressure. The procedure was tolerated well with [name] pain level of 0 throughout and [name] pain level of 0 following the procedure. Post Debridement Measurements: 3.4cm length x 5cm width x 0.1cm depth; 1.335cm^3 volume. Character of Wound/Ulcer Post Debridement is improved. Severity of Tissue Post Debridement is: Fat layer exposed. 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 There was [name] T Programmer, multimedia otal Procedure by Kalman Shan, DO. Post procedure Diagnosis Wound #2: Same as Pre-Procedure Notes: size 4. Plan Follow-up Appointments: [name], [name] ([id number]) [id number] Page 8 of 10 Return Appointment in 1 week. - w/ Dr. Heber [place] and bobbi rm # 8 Tuesday 12/15/22 @ 3:00 (already has appt.) Return Appointment in 2 weeks. - w/ Dr. Heber [place] and bobbi rm # 8 Tuesday 12/22/22 0800 (already has appt.) Other: - Leave dressing in place. Facility not to change dressing. Anesthetic: (In clinic) Topical Lidocaine 5% applied to wound bed Cellular or Tissue Based Products: Cellular or Tissue Based Product Type: - Run IVR for EpiFix and Epicord=100% covered 05/07/22: Epicord ordered 12/08/2022: [place] insurance for epicord and theraskin. Cellular or Tissue Based Product applied to wound bed, secured with  steri-strips, cover with Adaptic or Mepitel. (DO NOT REMOVE). - Epicord #1 05/15/25 Epicord #2 05/22/22 Epicord #3 06/29/2022 Epicord #4 07/07/2022 Epifix #5 07/14/22 Epicord #6 07/20/22 Epicord # 7  07/27/22 ***Run for Grafix***PENDING Epicord #8 08/04/2022 Edema Control - Lymphedema / SCD / Other: Elevate legs to the level of the heart or above for 30 minutes daily and/or when sitting for 3-4 times [name] day throughout the day. Avoid standing for long periods of time. Off-Loading: T Contact Cast to Left Lower Extremity - size 4 otal Additional Orders / Instructions: Follow Nutritious Diet - Monitor/Control Blood Sugar WOUND #2: - Calcaneus Wound Laterality: Left, Distal Cleanser: Soap and Water 1 x Per Week/30 Days Discharge Instructions: May shower and wash wound with dial antibacterial soap and water prior to dressing change. Cleanser: Wound Cleanser 1 x Per Week/30 Days Discharge Instructions: Cleanse the wound with wound cleanser prior to applying [name] clean dressing using gauze sponges, not tissue or cotton balls. Peri-Wound Care: Zinc Oxide Ointment 30g tube 1 x Per Week/30 Days Discharge Instructions: Apply Zinc Oxide to periwound with each dressing change Topical: Keystone 1 x Per Week/30 Days Prim Dressing: PolyMem Silver Non-Adhesive Dressing, 4.25x4.25 in 1 x Per Week/30 Days ary Discharge Instructions: Apply to wound bed as instructed Prim Dressing: Santyl Ointment 1 x Per Week/30 Days ary Discharge Instructions: Apply nickel thick amount to wound bed as instructed Secondary Dressing: ABD Pad, 8x10 1 x Per Week/30 Days Discharge Instructions: Apply over primary dressing as directed. Secondary Dressing: Woven Gauze Sponge, Non-Sterile 4x4 in (Home Health) 1 x Per Week/30 Days Discharge Instructions: Apply over primary dressing as directed. Secured With: [employer], 4.5x3.1 (in/yd) 1 x Per Week/30 Days Discharge Instructions: Secure with Kerlix as directed. 1. In office sharp  debridement 2. PolyMem with antibiotic ointment 3. T contact cast placed in standard fashion otal 4. Follow-up in 1 week Electronic Signature(s) Signed: 12/08/2022 12:10:40 PM By: Kalman Shan DO Entered By: Kalman Shan on 12/08/2022 08:44:46 -------------------------------------------------------------------------------- HxROS Details Patient Name: Date of Service: [name], [name]. 12/08/2022 8:00 [name] Medical Record Number: [id number] Patient Account Number: [id number] Date of Birth/Sex: Treating RN: 10/06/1960 (62 y.o. F) Primary Care Provider: Jenean Lindau Other Clinician: Referring Provider: Treating Provider/Extender: Elise Benne in Treatment: 39 Information Obtained From Patient Chart Hematologic/Lymphatic Medical History: Positive for: Anemia Respiratory Medical History: Positive for: Chronic Obstructive Pulmonary Disease (COPD) Cardiovascular Medical History: Positive for: Congestive Heart Failure - Weighs self daily; Hypertension; Myocardial Infarction; Peripheral Venous Disease Gastrointestinal Medical History: Past Medical History Notes: [name], [name] ([id number]) [phone].pdf Page 9 of 10 GERD, Peptic Ulcer Disease Endocrine Medical History: Positive for: Type II Diabetes Time with diabetes: 16 years Treated with: Insulin, Oral agents Blood sugar tested every day: Yes Tested : Twice daily Musculoskeletal Medical History: Past Medical History Notes: Broke left leg 3 years ago, 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, foot fused Neurologic Medical History: Positive for: Neuropathy Psychiatric Medical History: Past Medical History Notes: Depression Immunizations Pneumococcal Vaccine: Received Pneumococcal Vaccination: No Implantable Devices None Family and Social History Cancer: Yes - Father,Mother; Diabetes: Yes - Mother,Maternal Grandparents,Paternal Grandparents; Heart Disease: Yes -  Siblings,Maternal Grandparents; Hypertension: Yes - Siblings; Tuberculosis: Yes - Maternal Grandparents,Paternal Grandparents; Current every day smoker; Marital Status - Single; Alcohol Use: Never; Drug Use: No History; Caffeine Use: Daily; Financial Concerns: No; Food, Clothing or Shelter Needs: No; Support System Lacking: No; Transportation Concerns: No Electronic Signature(s) Signed: 12/08/2022 12:10:40 PM By: Kalman Shan DO Entered By: Kalman Shan on 12/08/2022 08:42:36 -------------------------------------------------------------------------------- Total Contact Cast Details Patient Name: Date of Service: [name] Byron [name]. 12/08/2022 8:00 [name] Medical Record Number: [id number] Patient Account Number: [id number]  Date of Birth/Sex: Treating RN: [birth year] (62 y.o. [name] Primary Care Provider: Jenean Lindau Other Clinician: Referring Provider: Treating Provider/Extender: Elise Benne in [id number] T Contact Cast Applied for Wound Assessment: otal Wound #2 Left,Distal Calcaneus Performed By: Physician Kalman Shan, DO Post Procedure Diagnosis Same as Pre-procedure Notes size 4 Electronic Signature(s) Signed: 12/08/2022 12:10:40 PM By: Kalman Shan DO Signed: 12/08/2022 5:29:51 PM By: Deon Pilling RN, BSN Entered By: Deon Pilling on 12/08/2022 08:38:53 Hedeen, Therasa [name] ([id number].pdf Page 10 of 10 -------------------------------------------------------------------------------- SuperBill Details Patient Name: Date of Service: [name] 12/08/2022 Medical Record Number: [id number] Patient Account Number: [id number] Date of Birth/Sex: Treating RN: 1961-06-08 (62 y.o. [name], [name] Primary Care Provider: Jenean Lindau Other Clinician: Referring Provider: Treating Provider/Extender: Elise Benne in Treatment: 39 Diagnosis Coding ICD-10 Codes Code  Description [phone] Non-pressure chronic ulcer of other part of left foot with fat layer exposed E11.621 Type 2 diabetes mellitus with foot ulcer E66.01 Morbid (severe) obesity due to excess calories Facility Procedures : CPT4 Code: [id number] Description: [id number] - DEB SUBQ TISSUE 20 SQ CM/< ICD-10 Diagnosis Description L97.522 Non-pressure chronic ulcer of other part of left foot with fat layer exposed E11.621 Type 2 diabetes mellitus with foot ulcer Modifier: Quantity: 1 Physician Procedures : CPT4 Code Description Modifier [id number] - WC PHYS SUBQ TISS 20 SQ CM ICD-10 Diagnosis Description L97.522 Non-pressure chronic ulcer of other part of left foot with fat layer exposed E11.621 Type 2 diabetes mellitus with foot ulcer Quantity: 1 Electronic Signature(s) Signed: 12/08/2022 12:10:40 PM By: Kalman Shan DO Entered By: Kalman Shan on 12/08/2022 08:45:19

## 2022-12-10 NOTE — Progress Notes (Signed)
Kenagy, Gailene [name] ([id number]) [id number].pdf Page 1 of 7 Visit Report for 12/08/2022 Arrival Information Details Patient Name: Date of Service: [name]. 12/08/2022 8:00 [name] M Medical Record Number: [id number] Patient Account Number: [name] Date of Birth/Sex: Treating RN: Dec 13, 1960 (62 y.o. [name], [name] Primary Care Edilson Vital: Jenean Lindau Other Clinician: Referring Aleda Madl: Treating Yaiza Palazzola/Extender: Elise Benne in Treatment: 39 Visit Information History Since Last Visit Added or deleted any medications: No Patient Arrived: Wheel Chair Any new allergies or adverse reactions: No Arrival Time: 08:10 Had [name] fall or experienced change in No Accompanied By: self activities of daily living that may affect Transfer Assistance: Manual risk of falls: Patient Identification Verified: Yes Signs or symptoms of abuse/neglect since last visito No Secondary Verification Process Completed: Yes Hospitalized since last visit: No Patient Requires Transmission-Based Precautions: No Implantable device outside of the clinic excluding No Patient Has Alerts: No cellular tissue based products placed in the center since last visit: Has Dressing in Place as Prescribed: Yes Has Footwear/Offloading in Place as Prescribed: Yes Left: T Contact Cast otal Pain Present Now: No Electronic Signature(s) Signed: 12/08/2022 5:29:51 PM By: Deon Pilling RN, BSN Entered By: Deon Pilling on 12/08/2022 08:11:10 -------------------------------------------------------------------------------- Encounter Discharge Information Details Patient Name: Date of Service: [name]. 12/08/2022 8:00 [name] M Medical Record Number: [id number] Patient Account Number: [name] Date of Birth/Sex: Treating RN: [birth year] (62 y.o. [name] Primary Care Rozlynn Lippold: Jenean Lindau Other Clinician: Referring Lavergne Hiltunen: Treating Emira Eubanks/Extender: Elise Benne in Treatment: 39 Encounter Discharge Information Items Post Procedure Vitals Discharge Condition: Stable Temperature (F): 98.2 Ambulatory Status: Wheelchair Pulse (bpm): 89 Discharge Destination: Home Respiratory Rate (breaths/min): 20 Transportation: Private Auto Blood Pressure (mmHg): 148/73 Accompanied By: self Schedule Follow-up Appointment: Yes Clinical Summary of Care: Electronic Signature(s) Signed: 12/08/2022 5:29:51 PM By: Deon Pilling RN, BSN Entered By: Deon Pilling on 12/08/2022 08:40:58 -------------------------------------------------------------------------------- Lower Extremity Assessment Details Patient Name: Date of Service: [name], [name]. 12/08/2022 8:00 [name] M Medical Record Number: [id number] Patient Account Number: [name] Date of Birth/Sex: Treating RN: 08-19-1961 (62 y.o. [name], [name] Primary Care Brynnly Bonet: Jenean Lindau Other Clinician: Referring Adanely Reynoso: Treating Nerine Pulse/Extender: Elise Benne in Treatment: 39 Edema Assessment Left: [Left: Right] [Right: :] Assessed: [Left: Yes] [Right: No] Edema: [Left: Ye] [Right: s] Calf Left: Right: Point of Measurement: 28 cm From Medial Instep 48 cm Ankle Left: Right: Point of Measurement: 3 cm From Medial Instep 29 cm Vascular Assessment Pulses: Dorsalis Pedis Palpable: [Left:Yes] Electronic Signature(s) Signed: 12/08/2022 5:29:51 PM By: Deon Pilling RN, BSN Entered By: Deon Pilling on 12/08/2022 08:19:52 -------------------------------------------------------------------------------- Multi Wound Chart Details Patient Name: Date of Service: [name]. 12/08/2022 8:00 [name] M Medical Record Number: [id number] Patient Account Number: [name] Date of Birth/Sex: Treating RN: Oct 23, 1960 (62 y.o. F) Primary Care Raevin Wierenga: Jenean Lindau Other Clinician: Referring Dalma Panchal: Treating Jesalyn Finazzo/Extender: Elise Benne in Treatment: 39 Vital Signs Height(in): 66 Capillary Blood Glucose(mg/dl): 149 Weight(lbs): 339 Pulse(bpm): 79 Body Mass Index(BMI): 54.7 Blood Pressure(mmHg): 148/73 Temperature(F): 98.2 Respiratory Rate(breaths/min): 20 [2:Photos:] [[name]:[name]] Left, Distal Calcaneus [name] Wound Location: Blister [name] Wounding Event: Diabetic Wound/Ulcer of the Lower [name] Primary Etiology: Extremity Anemia, Chronic Obstructive [name] Comorbid History: Pulmonary Disease (COPD), Congestive Heart Failure, Hypertension, Myocardial Infarction, Peripheral Venous Disease, Type II Diabetes, Neuropathy 06/15/2022 [name] Date Acquired: 31 [name] Weeks of Treatment: Open [name] Wound Status: No [name]  [name] Wound Recurrence: 3.4x5x0.1 [name] Measurements L x W x D (cm) 13.352 [name] [name] (cm) : rea 1.335 [name] Volume (cm) : 5.60% [name] % Reduction in [name] rea: 5.60% [name] % Reduction in Volume: Grade 2 [name] Classification: Medium [name] Exudate [name] mount: Serosanguineous [name] Exudate Type: red, brown [name] Exudate Color: [name], [name] [name] ([id number].pdf Page 3 of 7 Distinct, outline attached [name] Wound Margin: Large (67-100%) [name] Granulation Amount: Red, Pink [name] Granulation Quality: Small (1-33%) [name] Necrotic Amount: Fat Layer (Subcutaneous Tissue): Yes [name] Exposed Structures: Fascia: No Tendon: No Muscle: No Joint: No Bone: No Small (1-33%) [name] Epithelialization: Debridement - Excisional [name] Debridement: Pre-procedure Verification/Time Out 08:35 [name] Taken: Lidocaine 4% Topical Solution [name] Pain Control: Subcutaneous, Slough [name] Tissue Debrided: Skin/Subcutaneous Tissue [name] Level: 17 [name] Debridement [name] (sq cm): rea Curette [name] Instrument: Minimum [name] Bleeding: Pressure [name] Hemostasis [name] chieved: 0 [name]  [name] Procedural Pain: 0 [name] Post Procedural Pain: Procedure was tolerated well [name] Debridement Treatment Response: 3.4x5x0.1 [name] Post Debridement Measurements L x W x D (cm) 1.335 [name] Post Debridement Volume: (cm) No Abnormalities Noted [name] Periwound Skin Texture: Maceration: No [name] Periwound Skin Moisture: Dry/Scaly: No No Abnormalities Noted [name] Periwound Skin Color: No Abnormality [name] Temperature: Debridement [name] Procedures Performed: T Contact Cast otal Treatment Notes Wound #2 (Calcaneus) Wound Laterality: Left, Distal Cleanser Soap and Water Discharge Instruction: May shower and wash wound with dial antibacterial soap and water prior to dressing change. Wound Cleanser Discharge Instruction: Cleanse the wound with wound cleanser prior to applying [name] clean dressing using gauze sponges, not tissue or cotton balls. Peri-Wound Care Zinc Oxide Ointment 30g tube Discharge Instruction: Apply Zinc Oxide to periwound with each dressing change Topical Keystone Primary Dressing PolyMem Silver Non-Adhesive Dressing, 4.25x4.25 in Discharge Instruction: Apply to wound bed as instructed Santyl Ointment Discharge Instruction: Apply nickel thick amount to wound bed as instructed Secondary Dressing ABD Pad, 8x10 Discharge Instruction: Apply over primary dressing as directed. Woven Gauze Sponge, Non-Sterile 4x4 in Discharge Instruction: Apply over primary dressing as directed. Secured With [employer], 4.5x3.1 (in/yd) Discharge Instruction: Secure with Kerlix as directed. Compression Wrap Compression Stockings Add-Ons Electronic Signature(s) Signed: 12/08/2022 12:10:40 PM By: Kalman Shan DO Entered By: Kalman Shan on 12/08/2022 08:41:29 Bonini, Avelina [name] ([id number].pdf Page 4 of 7 -------------------------------------------------------------------------------- Multi-Disciplinary Care Plan  Details Patient Name: Date of Service: [name]. 12/08/2022 8:00 [name] M Medical Record Number: [id number] Patient Account Number: [name] Date of Birth/Sex: Treating RN: 1961-05-19 (62 y.o. [name], [name] Primary Care Yamilette Garretson: Jenean Lindau Other Clinician: Referring Javi Bollman: Treating Allyson Tineo/Extender: Elise Benne in Treatment: 39 Active Inactive Abuse / Safety / Falls / Self Care Management Nursing Diagnoses: History of Falls Goals: Patient will not experience any injury related to falls Date Initiated: 03/05/2022 Target Resolution Date: 02/05/2023 Goal Status: Active Interventions: Assess fall risk on admission and as needed Assess: immobility, friction, shearing, incontinence upon admission and as needed Assess impairment of mobility on admission and as needed per policy Notes: [id number]: Fall prevention ongoing, recent fall. Wound/Skin Impairment Nursing Diagnoses: Impaired tissue integrity Goals: Patient/caregiver will verbalize understanding of skin care regimen Date Initiated: 03/05/2022 Target Resolution Date: 02/06/2023 Goal Status: Active Ulcer/skin breakdown will have [name] volume reduction of 30% by week 4 Date Initiated:  03/05/2022 Date Inactivated: 04/30/2022 Target Resolution Date: 05/02/2022 Goal Status: Met Ulcer/skin breakdown will have [name] volume reduction of 50% by week 8 Date Initiated: 04/30/2022 Date Inactivated: 07/07/2022 Target Resolution Date: 07/04/2022 Unmet Reason: patient sent three Goal Status: Unmet weeks inpatient and rehab before returning to wound center. Interventions: Assess patient/caregiver ability to obtain necessary supplies Assess patient/caregiver ability to perform ulcer/skin care regimen upon admission and as needed Assess ulceration(s) every visit Provide education on smoking Provide education on ulcer and skin care Treatment Activities: Topical wound management initiated :  03/05/2022 Notes: 04/30/22: Wound care regimen continues. Electronic Signature(s) Signed: 12/08/2022 5:29:51 PM By: Deon Pilling RN, BSN Entered By: Deon Pilling on 12/08/2022 08:24:23 -------------------------------------------------------------------------------- Pain Assessment Details Patient Name: Date of Service: [name]. 12/08/2022 8:00 [name] M Medical Record Number: [id number] Patient Account Number: [name], [name] ([id number]) [phone].pdf Page 5 of 7 Date of Birth/Sex: Treating RN: Jun 15, 1961 (62 y.o. [name] Primary Care Jesse Hirst: Other Clinician: Jenean Lindau Referring Nyiesha Beever: Treating Mateen Franssen/Extender: Elise Benne in Treatment: 39 Active Problems Location of Pain Severity and Description of Pain Patient Has Paino No Site Locations Pain Management and Medication Current Pain Management: Electronic Signature(s) Signed: 12/08/2022 5:29:51 PM By: Deon Pilling RN, BSN Entered By: Deon Pilling on 12/08/2022 08:11:39 -------------------------------------------------------------------------------- Patient/Caregiver Education Details Patient Name: Date of Service: [name] Date of Birth/Gender: Treating RN: 09-Feb-1961 (62 y.o. [name] Primary Care Physician: Jenean Lindau Other Clinician: Referring Physician: Treating Physician/Extender: Elise Benne in Treatment: 39 Education Assessment Education Provided To: Patient Education Topics Provided Wound/Skin Impairment: Handouts: Caring for Your Ulcer Methods: Explain/Verbal Responses: Reinforcements needed Electronic Signature(s) Signed: 12/08/2022 5:29:51 PM By: Deon Pilling RN, BSN Entered By: Deon Pilling on 12/08/2022  08:24:34 -------------------------------------------------------------------------------- Wound Assessment Details Patient Name: Date of Service: [name]. 12/08/2022 8:00 [name] M Medical Record Number: [id number] Patient Account Number: [name] Date of Birth/Sex: Treating RN: Nov 08, 1960 (62 y.o. [name], [name], [name] ([id number]) [id number].pdf Page 6 of 7 Primary Care Danilynn Jemison: Jenean Lindau Other Clinician: Referring Tian Mcmurtrey: Treating Ayisha Pol/Extender: Elise Benne in Treatment: 39 Wound Status Wound Number: 2 Primary Diabetic Wound/Ulcer of the Lower Extremity Etiology: Wound Location: Left, Distal Calcaneus Wound Open Wounding Event: Blister Status: Date Acquired: 06/15/2022 Comorbid Anemia, Chronic Obstructive Pulmonary Disease (COPD), Weeks Of Treatment: 23 History: Congestive Heart Failure, Hypertension, Myocardial Infarction, Clustered Wound: No Peripheral Venous Disease, Type II Diabetes, Neuropathy Photos Wound Measurements Length: (cm) 3.4 Width: (cm) 5 Depth: (cm) 0.1 Area: (cm) 13.352 Volume: (cm) 1.335 % Reduction in Area: 5.6% % Reduction in Volume: 5.6% Epithelialization: Small (1-33%) Tunneling: No Undermining: No Wound Description Classification: Grade 2 Wound Margin: Distinct, outline attached Exudate Amount: Medium Exudate Type: Serosanguineous Exudate Color: red, brown Foul Odor After Cleansing: No Slough/Fibrino Yes Wound Bed Granulation Amount: Large (67-100%) Exposed Structure Granulation Quality: Red, Pink Fascia Exposed: No Necrotic Amount: Small (1-33%) Fat Layer (Subcutaneous Tissue) Exposed: Yes Necrotic Quality: Adherent Slough Tendon Exposed: No Muscle Exposed: No Joint Exposed: No Bone Exposed: No Periwound Skin Texture Texture Color No Abnormalities Noted: Yes No Abnormalities Noted: Yes Moisture Temperature / Pain No Abnormalities Noted:  No Temperature: No Abnormality Dry / Scaly: No Maceration: No Treatment Notes Wound #2 (Calcaneus) Wound Laterality: Left, Distal Cleanser Soap and Water Discharge Instruction: May shower and wash wound with dial antibacterial soap and water  prior to dressing change. Wound Cleanser Discharge Instruction: Cleanse the wound with wound cleanser prior to applying [name] clean dressing using gauze sponges, not tissue or cotton balls. Peri-Wound Care Zinc Oxide Ointment 30g tube Discharge Instruction: Apply Zinc Oxide to periwound with each dressing change Topical [name], [name] ([id number]) [id number].pdf Page 7 of 7 Keystone Primary Dressing PolyMem Silver Non-Adhesive Dressing, 4.25x4.25 in Discharge Instruction: Apply to wound bed as instructed Santyl Ointment Discharge Instruction: Apply nickel thick amount to wound bed as instructed Secondary Dressing ABD Pad, 8x10 Discharge Instruction: Apply over primary dressing as directed. Woven Gauze Sponge, Non-Sterile 4x4 in Discharge Instruction: Apply over primary dressing as directed. Secured With [employer], 4.5x3.1 (in/yd) Discharge Instruction: Secure with Kerlix as directed. Compression Wrap Compression Stockings Add-Ons Electronic Signature(s) Signed: 12/08/2022 5:29:51 PM By: Deon Pilling RN, BSN Entered By: Deon Pilling on 12/08/2022 08:20:52 -------------------------------------------------------------------------------- Vitals Details Patient Name: Date of Service: [name], [name]. 12/08/2022 8:00 [name] M Medical Record Number: [id number] Patient Account Number: [name] Date of Birth/Sex: Treating RN: 1961/04/29 (62 y.o. [name], [name] Primary Care Ronneisha Jett: Jenean Lindau Other Clinician: Referring Tamantha Saline: Treating Aison Malveaux/Extender: Elise Benne in Treatment: 39 Vital Signs Time Taken: 08:10 Temperature (F): 98.2 Height (in): 66 Pulse (bpm):  89 Weight (lbs): 339 Respiratory Rate (breaths/min): 20 Body Mass Index (BMI): 54.7 Blood Pressure (mmHg): 148/73 Capillary Blood Glucose (mg/dl): 149 Reference Range: 80 - 120 mg / dl Electronic Signature(s) Signed: 12/08/2022 5:29:51 PM By: Deon Pilling RN, BSN Entered By: Deon Pilling on 12/08/2022 08:11:32

## 2022-12-10 NOTE — Progress Notes (Signed)
Diguglielmo, Lateka C ([id number]) [id number].pdf Page 1 of 7 Visit Report for 12/03/2022 Arrival Information Details Patient Name: Date of Service: [name] 12/03/2022 2:45 PM Medical Record Number: [id number] Patient Account Number: [id number] Date of Birth/Sex: Treating RN: 1960-10-26 (62 y.o. F) Primary Care Makayah Pauli: Jenean Lindau Other Clinician: Referring Garlen Reinig: Treating Vikki Gains/Extender: Elise Benne in Treatment: 39 Visit Information History Since Last Visit Added or deleted any medications: No Patient Arrived: Wheel Chair Any new allergies or adverse reactions: No Arrival Time: 14:46 Had a fall or experienced change in No Accompanied By: self activities of daily living that may affect Transfer Assistance: None risk of falls: Patient Identification Verified: Yes Signs or symptoms of abuse/neglect since last No Secondary Verification Process Completed: Yes visito Patient Requires Transmission-Based Precautions: No Hospitalized since last visit: No Patient Has Alerts: No Implantable device outside of the clinic No excluding cellular tissue based products placed in the center since last visit: Has Dressing in Place as Prescribed: Yes Has Footwear/Offloading in Place as Yes Prescribed: Left: Removable Cast Walker/Walking Boot Pain Present Now: Yes Electronic Signature(s) Signed: 12/08/2022 3:59:08 PM By: Erenest Blank Entered By: Erenest Blank on 12/03/2022 14:54:22 -------------------------------------------------------------------------------- Encounter Discharge Information Details Patient Name: Date of Service: [name] Lax C. 12/03/2022 2:45 PM Medical Record Number: [id number] Patient Account Number: [id number] Date of Birth/Sex: Treating RN: 1961/08/25 (62 y.o. [name], [name] Primary Care Vicki Pasqual: Jenean Lindau Other Clinician: Referring Cristian Grieves: Treating Belisa Eichholz/Extender: Elise Benne in Treatment: 39 Encounter Discharge Information Items Post Procedure Vitals Discharge Condition: Stable Temperature (F): 98.7 Ambulatory Status: Wheelchair Pulse (bpm): 74 Discharge Destination: Home Respiratory Rate (breaths/min): 17 Transportation: Private Auto Blood Pressure (mmHg): 133/82 Accompanied By: self Schedule Follow-up Appointment: Yes Clinical Summary of Care: Patient Declined Electronic Signature(s) Signed: 12/03/2022 4:19:23 PM By: Rhae Hammock RN Entered By: Rhae Hammock on 12/03/2022 15:24:14 -------------------------------------------------------------------------------- Lower Extremity Assessment Details Patient Name: Date of Service: [name] Lax C. 12/03/2022 2:45 PM Medical Record Number: [id number] Patient Account Number: [id number] Date of Birth/Sex: Treating RN: [birth year] (62 y.o. [name], [name] Primary Care Damaris Geers: Jenean Lindau Other Clinician: Referring Aubrei Bouchie: Treating Issabella Rix/Extender: Waldron Session [place], [place] C ([id number]) [id number] Page 2 of 7 Weeks in Treatment: 39 Edema Assessment Assessed: [Left: Yes] [Right: No] Edema: [Left: Ye] [Right: s] Calf Left: Right: Point of Measurement: 28 cm From Medial Instep 48 cm Ankle Left: Right: Point of Measurement: 3 cm From Medial Instep 28 cm Vascular Assessment Pulses: Dorsalis Pedis Palpable: [Left:Yes] Posterior Tibial Palpable: [Left:Yes] Electronic Signature(s) Signed: 12/03/2022 4:19:23 PM By: Rhae Hammock RN Entered By: Rhae Hammock on 12/03/2022 15:05:53 -------------------------------------------------------------------------------- Multi Wound Chart Details Patient Name: Date of Service: [name] Lax C. 12/03/2022 2:45 PM Medical Record Number: [id number] Patient Account Number: [id number] Date of Birth/Sex: Treating RN: 1961/03/29 (62 y.o. F) Primary Care  Dmarius Reeder: Jenean Lindau Other Clinician: Referring Imre Vecchione: Treating Susanna Benge/Extender: Elise Benne in Treatment: 39 Vital Signs Height(in): 66 Capillary Blood Glucose(mg/dl): 96 Weight(lbs): 339 Pulse(bpm): 11 Body Mass Index(BMI): 54.7 Blood Pressure(mmHg): 155/74 Temperature(F): 98.7 Respiratory Rate(breaths/min): 17 [2:Photos: No Photos Left, Distal Calcaneus Wound Location: Blister Wounding Event: Diabetic Wound/Ulcer of the Lower Primary Etiology: Extremity Anemia, Chronic Obstructive Comorbid History: Pulmonary Disease (COPD), Congestive Heart Failure, Hypertension,  Myocardial Infarction, Peripheral Venous Disease, Type II Diabetes, Neuropathy 06/15/2022 Date Acquired: 22 Weeks of Treatment: Open Wound Status: No Wound Recurrence: 3.4x5x0.1 Measurements L x W x D (cm) 13.352 A (cm) :  rea 1.335 Volume (cm) : 5.60% %  Reduction in A rea: 5.60% % Reduction in Volume: Grade 2 Classification: Medium Exudate A mount: Serosanguineous Exudate Type: red, brown Exudate Color: Distinct, outline attached Wound Margin: Medium (34-66%) Granulation A mount: Red, Pink Granulation  Quality:] [N/A:N/A N/A N/A N/A N/A N/A N/A N/A N/A N/A N/A N/A N/A N/A N/A N/A N/A N/A N/A N/A N/A] Klunk, Moria C ([id number]) [2:Medium (34-66%) Necrotic Amount: Fat Layer (Subcutaneous Tissue): Yes N/A Exposed Structures: Fascia: No Tendon: No Muscle: No Joint: No Bone: No Small (1-33%) Epithelialization: Debridement - Excisional Debridement: Pre-procedure Verification/Time  Out 15:15 Taken: Lidocaine Pain Control: Subcutaneous, Slough Tissue Debrided: Skin/Subcutaneous Tissue Level: 17 Debridement A (sq cm): rea Curette Instrument: Minimum Bleeding: Pressure Hemostasis A chieved: 0 Procedural Pain: 0 Post Procedural Pain:  Procedure was tolerated well Debridement Treatment Response: 3.4x5x0.1 Post Debridement Measurements L x W x D (cm) 1.335 Post Debridement Volume: (cm) No  Abnormalities Noted Periwound Skin Texture: Maceration: No Periwound Skin Moisture: Dry/Scaly: No  No Abnormalities Noted Periwound Skin Color: No Abnormality Temperature: Debridement Procedures Performed: T Contact Cast otal] [N/A:N/A N/A N/A N/A N/A N/A N/A N/A N/A N/A N/A N/A N/A N/A N/A N/A N/A N/A N/A N/A N/A] Treatment Notes Wound #2 (Calcaneus) Wound Laterality: Left, Distal Cleanser Soap and Water Discharge Instruction: May shower and wash wound with dial antibacterial soap and water prior to dressing change. Wound Cleanser Discharge Instruction: Cleanse the wound with wound cleanser prior to applying a clean dressing using gauze sponges, not tissue or cotton balls. Peri-Wound Care Zinc Oxide Ointment 30g tube Discharge Instruction: Apply Zinc Oxide to periwound with each dressing change Topical Keystone Primary Dressing PolyMem Silver Non-Adhesive Dressing, 4.25x4.25 in Discharge Instruction: Apply to wound bed as instructed Santyl Ointment Discharge Instruction: Apply nickel thick amount to wound bed as instructed Secondary Dressing ABD Pad, 8x10 Discharge Instruction: Apply over primary dressing as directed. Woven Gauze Sponge, Non-Sterile 4x4 in Discharge Instruction: Apply over primary dressing as directed. Secured With [employer], 4.5x3.1 (in/yd) Discharge Instruction: Secure with Kerlix as directed. Compression Wrap Compression Stockings Add-Ons Electronic Signature(s) Signed: 12/03/2022 4:08:18 PM By: Kalman Shan DO Entered By: Kalman Shan on 12/03/2022 15:59:34 Vidrio, Letecia C ([id number].pdf Page 4 of 7 -------------------------------------------------------------------------------- Multi-Disciplinary Care Plan Details Patient Name: Date of Service: [name] 12/03/2022 2:45 PM Medical Record Number: [id number] Patient Account Number: [id number] Date of Birth/Sex: Treating RN: 20-Oct-1960 (62 y.o. [name], [name] Primary Care Jiaire Rosebrook: Jenean Lindau Other Clinician: Referring Eriyana Sweeten: Treating Yoseline Andersson/Extender: Elise Benne in Treatment: 39 Active Inactive Abuse / Safety / Falls / Self Care Management Nursing Diagnoses: History of Falls Goals: Patient will not experience any injury related to falls Date Initiated: 03/05/2022 Target Resolution Date: 01/01/2023 Goal Status: Active Interventions: Assess fall risk on admission and as needed Assess: immobility, friction, shearing, incontinence upon admission and as needed Assess impairment of mobility on admission and as needed per policy Notes: [id number]: Fall prevention ongoing, recent fall. Wound/Skin Impairment Nursing Diagnoses: Impaired tissue integrity Goals: Patient/caregiver will verbalize understanding of skin care regimen Date Initiated: 03/05/2022 Target Resolution Date: 01/01/2023 Goal Status: Active Ulcer/skin breakdown will have a volume reduction of 30% by week 4 Date Initiated: 03/05/2022 Date Inactivated: 04/30/2022 Target Resolution Date: 05/02/2022 Goal Status: Met Ulcer/skin breakdown will have a volume reduction of 50% by week 8 Date Initiated: 04/30/2022 Date Inactivated: 07/07/2022 Target Resolution Date: 07/04/2022 Unmet Reason: patient sent three Goal Status: Unmet weeks inpatient  and rehab before returning to wound center. Interventions: Assess patient/caregiver ability to obtain necessary supplies Assess patient/caregiver ability to perform ulcer/skin care regimen upon admission and as needed Assess ulceration(s) every visit Provide education on smoking Provide education on ulcer and skin care Treatment Activities: Topical wound management initiated : 03/05/2022 Notes: 04/30/22: Wound care regimen continues. Electronic Signature(s) Signed: 12/03/2022 4:19:23 PM By: Rhae Hammock RN Entered By: Rhae Hammock on 12/03/2022  14:59:46 -------------------------------------------------------------------------------- Pain Assessment Details Patient Name: Date of Service: [name] Lax C. 12/03/2022 2:45 PM Medical Record Number: [id number] Patient Account Number: [id number] Date of Birth/Sex: Treating RN: 04-25-1961 (62 y.o. [name], [name] Primary Care Shaniah Baltes: Jenean Lindau Other Clinician: Burney Gauze, Dustyn C ([id number]) [phone].pdf Page 5 of 7 Referring Krishan Mcbreen: Treating Arika Mainer/Extender: Elise Benne in Treatment: 39 Active Problems Location of Pain Severity and Description of Pain Patient Has Paino Yes Site Locations Pain Location: Pain in Ulcers With Dressing Change: Yes Duration of the Pain. Constant / Intermittento Intermittent Rate the pain. Current Pain Level: 4 Worst Pain Level: 10 Least Pain Level: 0 Tolerable Pain Level: 4 Character of Pain Describe the Pain: Aching Pain Management and Medication Current Pain Management: Medication: No Cold Application: No Rest: No Massage: No Activity: No T.E.N.S.: No Heat Application: No Leg drop or elevation: No Is the Current Pain Management Adequate: Adequate How does your wound impact your activities of daily livingo Sleep: No Bathing: No Appetite: No Relationship With Others: No Bladder Continence: No Emotions: No Bowel Continence: No Work: No Toileting: No Drive: No Dressing: No Hobbies: No Electronic Signature(s) Signed: 12/03/2022 4:19:23 PM By: Rhae Hammock RN Entered By: Rhae Hammock on 12/03/2022 15:05:46 -------------------------------------------------------------------------------- Patient/Caregiver Education Details Patient Name: Date of Service: [name] 2/29/2024andnbsp2:45 PM Medical Record Number: [id number] Patient Account Number: [id number] Date of Birth/Gender: Treating RN: 1961/04/11 (62 y.o. [name], [name] Primary Care  Physician: Jenean Lindau Other Clinician: Referring Physician: Treating Physician/Extender: Elise Benne in Treatment: 39 Education Assessment Education Provided To: Patient and Caregiver Education Topics Provided Wound/Skin Impairment: Methods: Explain/Verbal Responses: Reinforcements needed, State content correctly [place], Nikesha C ([id number]) [phone].pdf Page 6 of 7 Electronic Signature(s) Signed: 12/03/2022 4:19:23 PM By: Rhae Hammock RN Entered By: Rhae Hammock on 12/03/2022 15:00:14 -------------------------------------------------------------------------------- Wound Assessment Details Patient Name: Date of Service: [name] Lax C. 12/03/2022 2:45 PM Medical Record Number: [id number] Patient Account Number: [id number] Date of Birth/Sex: Treating RN: 14-Dec-1960 (62 y.o. [name], [name] Primary Care Sylvia Kondracki: Jenean Lindau Other Clinician: Referring Jacori Mulrooney: Treating Masha Orbach/Extender: Elise Benne in Treatment: 39 Wound Status Wound Number: 2 Primary Diabetic Wound/Ulcer of the Lower Extremity Etiology: Wound Location: Left, Distal Calcaneus Wound Open Wounding Event: Blister Status: Date Acquired: 06/15/2022 Comorbid Anemia, Chronic Obstructive Pulmonary Disease (COPD), Weeks Of Treatment: 22 History: Congestive Heart Failure, Hypertension, Myocardial Infarction, Clustered Wound: No Peripheral Venous Disease, Type II Diabetes, Neuropathy Wound Measurements Length: (cm) 3.4 Width: (cm) 5 Depth: (cm) 0.1 Area: (cm) 13.352 Volume: (cm) 1.335 % Reduction in Area: 5.6% % Reduction in Volume: 5.6% Epithelialization: Small (1-33%) Tunneling: No Undermining: No Wound Description Classification: Grade 2 Wound Margin: Distinct, outline attached Exudate Amount: Medium Exudate Type: Serosanguineous Exudate Color: red, brown Foul Odor After Cleansing:  No Slough/Fibrino Yes Wound Bed Granulation Amount: Medium (34-66%) Exposed Structure Granulation Quality: Red, Pink Fascia Exposed: No Necrotic Amount: Medium (34-66%) Fat Layer (Subcutaneous Tissue) Exposed: Yes Necrotic Quality: Adherent Slough Tendon Exposed: No Muscle Exposed: No Joint Exposed: No  Bone Exposed: No Periwound Skin Texture Texture Color No Abnormalities Noted: Yes No Abnormalities Noted: Yes Moisture Temperature / Pain No Abnormalities Noted: No Temperature: No Abnormality Dry / Scaly: No Maceration: No Electronic Signature(s) Signed: 12/03/2022 4:19:23 PM By: Rhae Hammock RN Entered By: Rhae Hammock on 12/03/2022 15:04:52 -------------------------------------------------------------------------------- Vitals Details Patient Name: Date of Service: [name] Lax C. 12/03/2022 2:45 PM Medical Record Number: [id number] Patient Account Number: [id number] Date of Birth/Sex: Treating RN: 07/16/1961 (62 y.o. [name], [name] Primary Care Anvita Hirata: Jenean Lindau Other Clinician: Referring Alexarae Oliva: Treating Jaicob Dia/Extender: Elise Benne in Treatment: [address] [place] C ([id number]) [id number] Page 7 of 7 Vital Signs Time Taken: 15:04 Temperature (F): 98.7 Height (in): 66 Pulse (bpm): 74 Weight (lbs): 339 Respiratory Rate (breaths/min): 17 Body Mass Index (BMI): 54.7 Blood Pressure (mmHg): 155/74 Capillary Blood Glucose (mg/dl): 96 Reference Range: 80 - 120 mg / dl Electronic Signature(s) Signed: 12/03/2022 4:19:23 PM By: Rhae Hammock RN Entered By: Rhae Hammock on 12/03/2022 15:05:13

## 2022-12-15 ENCOUNTER — Encounter (HOSPITAL_BASED_OUTPATIENT_CLINIC_OR_DEPARTMENT_OTHER): Payer: 59 | Admitting: General Surgery

## 2022-12-15 DIAGNOSIS — E11621 Type 2 diabetes mellitus with foot ulcer: Secondary | ICD-10-CM | POA: Diagnosis not present

## 2022-12-16 NOTE — Progress Notes (Signed)
Patron, [name] ([id number]) [id number] Page 1 of 7 Visit Report for 12/15/2022 Arrival Information Details Patient Name: Date of Service: [name]. 12/15/2022 3:00 PM Medical Record Number: [id number] Patient Account Number: [id number] Date of Birth/Sex: Treating RN: 08-Jul-1961 (62 y.o. [name], [name] Primary Care [name]: [name] Other Clinician: Referring [name]: Treating [name]/Extender: [name] in Treatment: 60 Visit Information History Since Last Visit Added or deleted any medications: No Patient Arrived: Wheel Chair Any new allergies or adverse reactions: No Arrival Time: 14:55 Had a fall or experienced change in No Accompanied By: self activities of daily living that may affect Transfer Assistance: None risk of falls: Patient Identification Verified: Yes Signs or symptoms of abuse/neglect since last visito No Secondary Verification Process Completed: Yes Hospitalized since last visit: No Patient Requires Transmission-Based Precautions: No Implantable device outside of the clinic excluding No Patient Has Alerts: No cellular tissue based products placed in the center since last visit: Has Dressing in Place as Prescribed: Yes Has Footwear/Offloading in Place as Prescribed: Yes Left: T Contact Cast otal Pain Present Now: No Electronic Signature(s) Signed: 12/15/2022 4:59:46 PM By: Deon Pilling RN, BSN Entered By: Deon Pilling on 12/15/2022 14:56:00 -------------------------------------------------------------------------------- Encounter Discharge Information Details Patient Name: Date of Service: [name]. 12/15/2022 3:00 PM Medical Record Number: [id number] Patient Account Number: [id number] Date of Birth/Sex: Treating RN: 03/12/1961 (62 y.o. [name], [name] Primary Care [name]: [name] Other Clinician: Referring [name]: Treating [name]/Extender: [name] in Treatment: 77 Encounter Discharge Information Items Discharge Condition: Stable Ambulatory Status: Wheelchair Discharge Destination: Home Transportation: Private Auto Accompanied By: self Schedule Follow-up Appointment: Yes Clinical Summary of Care: Electronic Signature(s) Signed: 12/15/2022 4:59:46 PM By: Deon Pilling RN, BSN Entered By: Deon Pilling on 12/15/2022 15:20:39 -------------------------------------------------------------------------------- Lower Extremity Assessment Details Patient Name: Date of Service: [name]. 12/15/2022 3:00 PM Medical Record Number: [id number] Patient Account Number: [id number] Date of Birth/Sex: Treating RN: January 20, 1961 (62 y.o. [name], [name] Primary Care [name]: [name] Other Clinician: Referring [name]: Treating [name]/Extender: [name] in Treatment: 40 Edema Assessment Left: [Left: Right] [Right: :] Assessed: [Left: Yes] [Right: No] Edema: [Left: Ye] [Right: s] Calf Left: Right: Point of Measurement: 28 cm From Medial Instep 49 cm Ankle Left: Right: Point of Measurement: 3 cm From Medial Instep 29 cm Vascular Assessment Pulses: Dorsalis Pedis Palpable: [Left:Yes] Electronic Signature(s) Signed: 12/15/2022 4:59:46 PM By: Deon Pilling RN, BSN Entered By: Deon Pilling on 12/15/2022 15:03:10 -------------------------------------------------------------------------------- Multi Wound Chart Details Patient Name: Date of Service: [name]. 12/15/2022 3:00 PM Medical Record Number: [id number] Patient Account Number: [id number] Date of Birth/Sex: Treating RN: July 11, 1961 (62 y.o. F) Primary Care Kaegan Hettich: [name] Other Clinician: Referring Hayleigh Bawa: Treating [name]/Extender: [name] in Treatment: 40 Vital Signs Height(in): 66 Capillary Blood Glucose(mg/dl): 116 Weight(lbs):  339 Pulse(bpm): 64 Body Mass Index(BMI): 54.7 Blood Pressure(mmHg): 163/72 Temperature(F): 98 Respiratory Rate(breaths/min): 20 [2:Photos:] [N/A:N/A] Left, Distal Calcaneus N/A N/A Wound Location: Blister N/A N/A Wounding Event: Diabetic Wound/Ulcer of the Lower N/A N/A Primary Etiology: Extremity Anemia, Chronic Obstructive N/A N/A Comorbid History: Pulmonary Disease (COPD), Congestive Heart Failure, Hypertension, Myocardial Infarction, Peripheral Venous Disease, Type II Diabetes, Neuropathy 06/15/2022 N/A N/A Date Acquired: 24 N/A N/A Weeks of Treatment: Open N/A N/A Wound Status: No N/A N/A Wound Recurrence: 3x4.2x0.1 N/A N/A Measurements L x W x D (cm) 9.896 N/A N/A A (cm) : rea 0.99  N/A N/A Volume (cm) : 30.00% N/A N/A % Reduction in A rea: 30.00% N/A N/A % Reduction in Volume: Grade 2 N/A N/A Classification: Medium N/A N/A Exudate A mount: Serosanguineous N/A N/A Exudate Type: red, brown N/A N/A Exudate Color: [name], [name] ([id number]) [id number] Page 3 of 7 Distinct, outline attached N/A N/A Wound Margin: Medium (34-66%) N/A N/A Granulation Amount: Red, Pink N/A N/A Granulation Quality: Medium (34-66%) N/A N/A Necrotic Amount: Fat Layer (Subcutaneous Tissue): Yes N/A N/A Exposed Structures: Fascia: No Tendon: No Muscle: No Joint: No Bone: No Small (1-33%) N/A N/A Epithelialization: No Abnormalities Noted N/A N/A Periwound Skin Texture: Maceration: No N/A N/A Periwound Skin Moisture: Dry/Scaly: No No Abnormalities Noted N/A N/A Periwound Skin Color: No Abnormality N/A N/A Temperature: T Contact Cast otal N/A N/A Procedures Performed: Treatment Notes Wound #2 (Calcaneus) Wound Laterality: Left, Distal Cleanser Soap and Water Discharge Instruction: May shower and wash wound with dial antibacterial soap and water prior to dressing change. Wound Cleanser Discharge Instruction: Cleanse the wound with  wound cleanser prior to applying a clean dressing using gauze sponges, not tissue or cotton balls. Peri-Wound Care Zinc Oxide Ointment 30g tube Discharge Instruction: Apply Zinc Oxide to periwound with each dressing change Topical Keystone Primary Dressing PolyMem Silver Non-Adhesive Dressing, 4.25x4.25 in Discharge Instruction: Apply to wound bed as instructed Secondary Dressing ABD Pad, 8x10 Discharge Instruction: Apply over primary dressing as directed. Woven Gauze Sponge, Non-Sterile 4x4 in Discharge Instruction: Apply over primary dressing as directed. Secured With [employer], 4.5x3.1 (in/yd) Discharge Instruction: Secure with Kerlix as directed. Compression Wrap Compression Stockings Add-Ons Electronic Signature(s) Signed: 12/15/2022 3:42:37 PM By: Fredirick Maudlin MD FACS Entered By: Fredirick Maudlin on 12/15/2022 15:42:36 -------------------------------------------------------------------------------- Multi-Disciplinary Care Plan Details Patient Name: Date of Service: [name]. 12/15/2022 3:00 PM Medical Record Number: [id number] Patient Account Number: [id number] Date of Birth/Sex: Treating RN: 29-Apr-1961 (62 y.o. [name], [name] Primary Care Vi Biddinger: [name] Other Clinician: Referring Jadasia Haws: Treating Khalani Novoa/Extender: [name] in Treatment: [address] Inactive [name], [name] ([id number]) [id number] Page 4 of 7 Abuse / Safety / Falls / Self Care Management Nursing Diagnoses: History of Falls Goals: Patient will not experience any injury related to falls Date Initiated: 03/05/2022 Target Resolution Date: 02/05/2023 Goal Status: Active Interventions: Assess fall risk on admission and as needed Assess: immobility, friction, shearing, incontinence upon admission and as needed Assess impairment of mobility on admission and as needed per policy Notes: [id number]: Fall prevention  ongoing, recent fall. Wound/Skin Impairment Nursing Diagnoses: Impaired tissue integrity Goals: Patient/caregiver will verbalize understanding of skin care regimen Date Initiated: 03/05/2022 Target Resolution Date: 02/06/2023 Goal Status: Active Ulcer/skin breakdown will have a volume reduction of 30% by week 4 Date Initiated: 03/05/2022 Date Inactivated: 04/30/2022 Target Resolution Date: 05/02/2022 Goal Status: Met Ulcer/skin breakdown will have a volume reduction of 50% by week 8 Date Initiated: 04/30/2022 Date Inactivated: 07/07/2022 Target Resolution Date: 07/04/2022 Unmet Reason: patient sent three Goal Status: Unmet weeks inpatient and rehab before returning to wound center. Interventions: Assess patient/caregiver ability to obtain necessary supplies Assess patient/caregiver ability to perform ulcer/skin care regimen upon admission and as needed Assess ulceration(s) every visit Provide education on smoking Provide education on ulcer and skin care Treatment Activities: Topical wound management initiated : 03/05/2022 Notes: 04/30/22: Wound care regimen continues. Electronic Signature(s) Signed: 12/15/2022 4:59:46 PM By: Deon Pilling RN, BSN Entered By: Deon Pilling on 12/15/2022 15:11:42 -------------------------------------------------------------------------------- Pain Assessment Details Patient Name: Date of Service: [name]  N, [name]. 12/15/2022 3:00 PM Medical Record Number: [id number] Patient Account Number: [id number] Date of Birth/Sex: Treating RN: [birth year] (62 y.o. [name] Primary Care Deandra Gadson: [name] Other Clinician: Referring Vandy Fong: Treating Kathyjo Briere/Extender: [name] in Treatment: 40 Active Problems Location of Pain Severity and Description of Pain Patient Has Paino Yes Site Locations Pain Location: Franta, Aidyn [name] ([id number]) [phone].pdf Page 5 of 7 Pain Location: Pain in  Ulcers Rate the pain. Current Pain Level: 4 Pain Management and Medication Current Pain Management: Medication: No Cold Application: No Rest: No Massage: No Activity: No T.E.N.S.: No Heat Application: No Leg drop or elevation: No Is the Current Pain Management Adequate: Adequate How does your wound impact your activities of daily livingo Sleep: No Bathing: No Appetite: No Relationship With Others: No Bladder Continence: No Emotions: No Bowel Continence: No Work: No Toileting: No Drive: No Dressing: No Hobbies: No Engineer, maintenance) Signed: 12/15/2022 4:59:46 PM By: Deon Pilling RN, BSN Entered By: Deon Pilling on 12/15/2022 14:56:35 -------------------------------------------------------------------------------- Patient/Caregiver Education Details Patient Name: Date of Service: [name] 3/12/2024andnbsp3:00 PM Medical Record Number: [id number] Patient Account Number: [id number] Date of Birth/Gender: Treating RN: 06-Oct-1960 (62 y.o. [name] Primary Care Physician: [name] Other Clinician: Referring Physician: Treating Physician/Extender: [name] in Treatment: 2 Education Assessment Education Provided To: Patient Education Topics Provided Wound/Skin Impairment: Handouts: Caring for Your Ulcer Methods: Explain/Verbal Responses: Reinforcements needed Electronic Signature(s) Signed: 12/15/2022 4:59:46 PM By: Deon Pilling RN, BSN Entered By: Deon Pilling on 12/15/2022 15:11:54 -------------------------------------------------------------------------------- Wound Assessment Details Patient Name: Date of Service: [place], [name]. 12/15/2022 3:00 PM Spinner, Ranelle [name] ([id number]) [id number] Page 6 of 7 Medical Record Number: [id number] Patient Account Number: [id number] Date of Birth/Sex: Treating RN: 1961-07-16 (62 y.o. [name], [name] Primary Care Edwinna Rochette: [name] Other Clinician: Referring Beauford Lando: Treating Avier Jech/Extender: [name] in Treatment: 40 Wound Status Wound Number: 2 Primary Diabetic Wound/Ulcer of the Lower Extremity Etiology: Wound Location: Left, Distal Calcaneus Wound Open Wounding Event: Blister Status: Date Acquired: 06/15/2022 Comorbid Anemia, Chronic Obstructive Pulmonary Disease (COPD), Weeks Of Treatment: 24 History: Congestive Heart Failure, Hypertension, Myocardial Infarction, Clustered Wound: No Peripheral Venous Disease, Type II Diabetes, Neuropathy Photos Wound Measurements Length: (cm) 3 Width: (cm) 4.2 Depth: (cm) 0.1 Area: (cm) 9.896 Volume: (cm) 0.99 % Reduction in Area: 30% % Reduction in Volume: 30% Epithelialization: Small (1-33%) Tunneling: No Undermining: No Wound Description Classification: Grade 2 Wound Margin: Distinct, outline attached Exudate Amount: Medium Exudate Type: Serosanguineous Exudate Color: red, brown Foul Odor After Cleansing: No Slough/Fibrino Yes Wound Bed Granulation Amount: Medium (34-66%) Exposed Structure Granulation Quality: Red, Pink Fascia Exposed: No Necrotic Amount: Medium (34-66%) Fat Layer (Subcutaneous Tissue) Exposed: Yes Necrotic Quality: Adherent Slough Tendon Exposed: No Muscle Exposed: No Joint Exposed: No Bone Exposed: No Periwound Skin Texture Texture Color No Abnormalities Noted: Yes No Abnormalities Noted: Yes Moisture Temperature / Pain No Abnormalities Noted: No Temperature: No Abnormality Dry / Scaly: No Maceration: No Treatment Notes Wound #2 (Calcaneus) Wound Laterality: Left, Distal Cleanser Soap and Water Discharge Instruction: May shower and wash wound with dial antibacterial soap and water prior to dressing change. Wound Cleanser Discharge Instruction: Cleanse the wound with wound cleanser prior to applying a clean dressing using gauze sponges, not tissue or cotton balls. Peri-Wound  Care Zinc Oxide Ointment 30g tube Discharge Instruction: Apply Zinc Oxide to periwound with each dressing change [name], [name] ([id number]) [id number]  Page 7 of 7 Topical Keystone Primary Dressing PolyMem Silver Non-Adhesive Dressing, 4.25x4.25 in Discharge Instruction: Apply to wound bed as instructed Secondary Dressing ABD Pad, 8x10 Discharge Instruction: Apply over primary dressing as directed. Woven Gauze Sponge, Non-Sterile 4x4 in Discharge Instruction: Apply over primary dressing as directed. Secured With [employer], 4.5x3.1 (in/yd) Discharge Instruction: Secure with Kerlix as directed. Compression Wrap Compression Stockings Add-Ons Electronic Signature(s) Signed: 12/15/2022 3:45:12 PM By: Worthy Rancher Signed: 12/15/2022 4:59:46 PM By: Deon Pilling RN, BSN Entered By: Worthy Rancher on 12/15/2022 15:09:37 -------------------------------------------------------------------------------- Vitals Details Patient Name: Date of Service: [name] Delane Ginger, [name]. 12/15/2022 3:00 PM Medical Record Number: [id number] Patient Account Number: [id number] Date of Birth/Sex: Treating RN: July 01, 1961 (62 y.o. [name], [name] Primary Care Kelli Egolf: [name] Other Clinician: Referring Kristy Catoe: Treating Delany Steury/Extender: [name] in Treatment: 40 Vital Signs Time Taken: 14:55 Temperature (F): 98 Height (in): 66 Pulse (bpm): 87 Weight (lbs): 339 Respiratory Rate (breaths/min): 20 Body Mass Index (BMI): 54.7 Blood Pressure (mmHg): 163/72 Capillary Blood Glucose (mg/dl): 116 Reference Range: 80 - 120 mg / dl Electronic Signature(s) Signed: 12/15/2022 4:59:46 PM By: Deon Pilling RN, BSN Entered By: Deon Pilling on 12/15/2022 14:56:45

## 2022-12-16 NOTE — Progress Notes (Signed)
Mittman, Maybree [name] ([id number]) [id number] Page 1 of 9 Visit Report for 12/15/2022 Chief Complaint Document Details Patient Name: Date of Service: [name]. 12/15/2022 3:00 PM Medical Record Number: [id number] Patient Account Number: [id number] Date of Birth/Sex: Treating RN: 10-24-1960 (62 y.o. F) Primary Care Provider: Jenean Lindau Other Clinician: Referring Provider: Treating Provider/Extender: Neita Garnet in Treatment: 66 Information Obtained from: Patient Chief Complaint 03/05/2022; left foot wound Electronic Signature(s) Signed: 12/15/2022 3:42:44 PM By: Fredirick Maudlin MD FACS Entered By: Fredirick Maudlin on 12/15/2022 15:42:43 -------------------------------------------------------------------------------- HPI Details Patient Name: Date of Service: [name], [name]. 12/15/2022 3:00 PM Medical Record Number: [id number] Patient Account Number: [id number] Date of Birth/Sex: Treating RN: 1961-03-12 (62 y.o. F) Primary Care Provider: Jenean Lindau Other Clinician: Referring Provider: Treating Provider/Extender: Neita Garnet in Treatment: 40 History of Present Illness HPI Description: Admission 03/05/2022 [name] is [name] 62 year old female with [name] past medical history of uncontrolled insulin-dependent type 2 diabetes, tobacco user and chronic diastolic heart failure that presents to the clinic for [name] 40-monthhistory of wound to her left heel. She states she had [name] left ankle fusion in September 2022. She states that she always had [name] wound after the surgery and it never healed. She has home health and they have been doing compression wraps along with silver alginate to the wound bed. She currently denies signs of infection. 6/8; this is [name] 62year old woman with type 2 diabetes. She developed [name] wound on her left Achilles heel just above the tip of the heel in the setting of recurrent ankle  surgeries in late 2022. I have seen some of these results from either the cast or the surgical boots that are put on after these operations. She thinks this may be the case. And [name] sense of pressure ulcer. In any case that she has been using Santyl Hydrofera Blue under compression. She is not wearing any footwear at home as she cannot find anything to accommodate the wrap 6/22; patient presents for follow-up. She has home health that comes out once [name] week to help with dressing changes. She has no issues or complaints today. We have been using Hydrofera Blue under compression therapy. 7/6; patient presents for follow-up. She states that home health did not come out for the past 2 weeks. It is unclear why. We have been using Hydrofera Blue and Santyl under compression therapy. 7/13; patient presents for follow-up. She did not take the oral antibiotics prescribed at last clinic visit. We have been using Hydrofera Blue with gentamicin/mupirocin ointment under compression therapy. She has no issues or complaints today. She denies signs of infection. 7/20; patient presents for follow-up. We have been using Hydrofera Blue with antibiotic ointment under 3 layer compression. She has home health that change the dressing once. They put collagen on the wound bed. She also reports falling yesterday and hitting her right foot. She has no pain to the area today. 7/27; patient presents for follow-up. We have been using Hydrofera Blue under 3 layer compression. She has home health that changes the dressing. She has no issues or complaints today. 8/3; patient presents for follow-up. We continues to use Hydrofera Blue under 3 layer compression. She has no issues or complaints today. She has been approved for Epicord at 100%. 8/11; patient presents for follow-up. We have been using Hydrofera Blue under 3 layer compression. She has been approved for Epicord and we have this today. She is in agreement  with having this  applied. 8/18; patient presents for follow-up. Epicord #1 was placed in standard fashion last clinic visit. She has no issues or complaints today. 9/18; Unfortunately patient has missed her last clinic appointments due to falling and breaking her right ankle. We have been following her for her left ankle wound. She has also developed [name] large blister to the left heel over the past week that has ruptured and dried. She currently resides In [facility]. Wound9/25; patient presents for follow-up. We have been using Hydrofera Blue and Santyl to the original left ankle wound and Xeroform to the dried blistered. We have been wrapping her with compression therapy. She resides in [name] facility and they changed the wrap once this past week. She has no issues or complaints today. She denies signs of infection. 10/3; patient presents for follow-up. We have been using Xeroform to the heel wound and Epicort to the left ankle wound All under compression therapy. She [name], [name] ([id number]) [id number] Page 2 of 9 has no issues or complaints today. 10/10; patient presents for follow-up. We have been using Epicort to the left ankle wound and Santyl and Hydrofera Blue to the heel wound. All under compression therapy. she has no issues or complaints today. 10/16; patient presents for follow-up. Epicort was placed in standard fashion to the superior wound and onto the heel and Santyl and Hydrofera Blue. She states that the wrap got wet during her shower and her facility was able to rewrap with Kerlix/Coban. 10/23; patient presents for follow-up. Epicord was placed to the wound beds last clinic visit. We continue Kerlix/Coban. She has no issues or complaints today. 10/31; Patient presents for follow-up. Epicord was placed to the wound beds last clinic visit. The original wound is almost healed. We have done this under Kerlix/Coban. She denies signs of infection. 11/10; patient presents for  follow-up. Patient's last epi cord was placed in standard fashion at last clinic visit. The original wound has healed. She still has [name] heel wound. Grafix was approved however too costly for the patient. We will rerun epi cord to see if she can have more applications. She had done very well with this. We have been using Hydrofera Blue to the heel under Kerlix/Coban. 11/21; patient presents for follow-up. We have been using Hydrofera Blue and Santyl to the heel under Kerlix/Coban. She switched to [name] new healthcare insurance and again The skin substitute is too costly for the patient. She denies signs of infection. 11/28; patient presents for follow-up. Her facility did not change the wrap last clinic visit due to lack of staffing. The wrap was taken off and Hydrofera Blue dressing has been in place for the past week. She has no issues or complaints today. 12/5; patient presents for follow-up. She states she has had more drainage over the past week. They did not change the wrap at her facility. She states the nurse will be back this week. She is also been doing [name] lot of physical therapy. She has been using the Prevalon boot while in bed. 12/12; patient presents for follow-up. We have been using Hydrofera Blue with antibiotic ointment under 4-layer compression. She is tolerated the wrap well. She states she is using her Prevalon boot while in the wheelchair. She has no issues or complaints today. There is been improvement in wound healing. 12/19; patient presents for follow-up. We have been using Hydrofera Blue and antibiotic ointment to the wound bed under 4-layer compression. Her facility change the wrap once.  She states she has been using the Prevalon boot in bed but not in the wheelchair due to issues with transferring. Overall there is still improvement in wound healing. 1/16; patient presents for follow-up. She missed her last clinic appointment. We have been using Hydrofera Blue and antibiotic  ointment under 4-layer compression. At the facility this has only been changed twice over the past 3 weeks. They are not using 4-layer compression. She came in with Kerlix/Coban. 1/23; patient presents for follow-up. We have been using Santyl and Hydrofera Blue under 4-layer compression. Patient has no issues or complaints today. 2/1; patient presents for follow-up. Patient's been using Santyl and Hydrofera Blue to the wound bed. She has developed more slough and now [name] mild odor. She states she is wearing the Prevalon boot at night. It is unclear if she is offloading this area during the day. 2/13; patient presents for follow-up. Patient's been using Dakin's wet-to-dry dressings. She received her Keystone antibiotic ointment in the mail. She has not used this yet. She reports using her Prevalon boot. She has no issues or complaints today. 2/20; patient presents for follow-up. She has been using Keystone antibiotic ointment with Hydrofera Blue. She has no issues or complaints today. Due to transportation she cannot do the total contact cast today. She is scheduled for next week to have this placed. 2/27; patient presents for follow-up. Plan is for the total contact cast today. We have been using Keystone and Hydrofera Blue to the wound bed. She forgot her Keystone antibiotic ointment. She has no issues or complaints today. 2/29; patient presents for follow-up. Patient presents for her obligatory cast change. She has no issues or complaints today. 3/5; patient presents for follow-up. She has had the cast in place for the past week and has tolerated this well. She has no issues or complaints today. We have been using PolyMem with Keystone antibiotic ointment. 12/15/2022: The wound is quite clean and measured smaller today. Electronic Signature(s) Signed: 12/15/2022 3:43:16 PM By: Fredirick Maudlin MD FACS Entered By: Fredirick Maudlin on 12/15/2022  15:43:16 -------------------------------------------------------------------------------- Physical Exam Details Patient Name: Date of Service: [name], [name]. 12/15/2022 3:00 PM Medical Record Number: [id number] Patient Account Number: [id number] Date of Birth/Sex: Treating RN: 1961-08-05 (62 y.o. F) Primary Care Provider: Jenean Lindau Other Clinician: Referring Provider: Treating Provider/Extender: Neita Garnet in Treatment: 26 Constitutional Hypertensive, asymptomatic. . . . no acute distress. Respiratory Normal work of breathing on room air. Notes 12/15/2022: The wound is quite clean and measured smaller today. Electronic Signature(s) Valente, Robyne [name] [id number].pdf Page 3 of 9 Signed: 12/15/2022 3:45:01 PM By: Fredirick Maudlin MD FACS Entered By: Fredirick Maudlin on 12/15/2022 15:45:00 -------------------------------------------------------------------------------- Physician Orders Details Patient Name: Date of Service: Griffith [name]. 12/15/2022 3:00 PM Medical Record Number: [id number] Patient Account Number: [id number] Date of Birth/Sex: Treating RN: 10-03-1961 (62 y.o. [name], [name] Primary Care Provider: Jenean Lindau Other Clinician: Referring Provider: Treating Provider/Extender: Neita Garnet in Treatment: [phone] Verbal / Phone Orders: No Diagnosis Coding ICD-10 Coding Code Description [phone] Non-pressure chronic ulcer of other part of left foot with fat layer exposed E11.621 Type 2 diabetes mellitus with foot ulcer E66.01 Morbid (severe) obesity due to excess calories Follow-up Appointments ppointment in 1 week. - w/ Dr. Heber [place] [facility] [place] [facility] # 8 Tuesday 12/22/22 0800 (already has appt.) Return [name] ppointment in 2 weeks. - Dr. Heber [place] 12/29/2022 0800 (already has an appt). Return [name] Other: - Leave dressing  in place. Facility not to change dressing. Anesthetic (In  clinic) Topical Lidocaine 5% applied to wound bed Cellular or Tissue Based Products Cellular or Tissue Based Product Type: - Run IVR for EpiFix and Epicord=100% covered 05/07/22: Epicord ordered 12/08/2022: [employer] insurance for epicord and theraskin. daptic or Mepitel. (DO NOT REMOVE). - Cellular or Tissue Based Product applied to wound bed, secured with steri-strips, cover with [name] Epicord #1 05/15/25 Epicord #2 05/22/22 Epicord #3 06/29/2022 Epicord #4 07/07/2022 Epifix #5 07/14/22 Epicord #6 07/20/22 Epicord # 7 07/27/22 ***Run for Grafix***PENDING Epicord #8 08/04/2022 Edema Control - Lymphedema / SCD / Other Elevate legs to the level of the heart or above for 30 minutes daily and/or when sitting for 3-4 times [name] day throughout the day. Avoid standing for long periods of time. Off-Loading Total Contact Cast to Left Lower Extremity - size 4 Additional Orders / Instructions Follow Nutritious Diet - Monitor/Control Blood Sugar Wound Treatment Wound #2 - Calcaneus Wound Laterality: Left, Distal Cleanser: Soap and Water 1 x Per Week/30 Days Discharge Instructions: May shower and wash wound with dial antibacterial soap and water prior to dressing change. Cleanser: Wound Cleanser 1 x Per Week/30 Days Discharge Instructions: Cleanse the wound with wound cleanser prior to applying [name] clean dressing using gauze sponges, not tissue or cotton balls. Peri-Wound Care: Zinc Oxide Ointment 30g tube 1 x Per Week/30 Days Discharge Instructions: Apply Zinc Oxide to periwound with each dressing change Topical: Keystone 1 x Per Week/30 Days Prim Dressing: PolyMem Silver Non-Adhesive Dressing, 4.25x4.25 in 1 x Per Week/30 Days ary Discharge Instructions: Apply to wound bed as instructed Secondary Dressing: ABD Pad, 8x10 1 x Per Week/30 Days Discharge Instructions: Apply over primary dressing as directed. Secondary Dressing: Woven Gauze Sponge, Non-Sterile 4x4 in [facility]) 1 x Per Week/30 Days Hisaw, Irena [name]  ([id number]) [id number] Page 4 of 9 Discharge Instructions: Apply over primary dressing as directed. Secured With: [employer], 4.5x3.1 (in/yd) 1 x Per Week/30 Days Discharge Instructions: Secure with Kerlix as directed. Electronic Signature(s) Signed: 12/15/2022 3:47:52 PM By: Fredirick Maudlin MD FACS Previous Signature: 12/15/2022 3:44:06 PM Version By: Fredirick Maudlin MD FACS Entered By: Fredirick Maudlin on 12/15/2022 15:45:23 -------------------------------------------------------------------------------- Problem List Details Patient Name: Date of Service: [name], [name]. 12/15/2022 3:00 PM Medical Record Number: [id number] Patient Account Number: [id number] Date of Birth/Sex: Treating RN: 1961/07/15 (62 y.o. [name], [name] Primary Care Provider: Jenean Lindau Other Clinician: Referring Provider: Treating Provider/Extender: Elise Benne in Treatment: 73 Active Problems ICD-10 Encounter Code Description Active Date MDM Diagnosis [phone] Non-pressure chronic ulcer of other part of left foot with fat layer exposed 03/05/2022 No Yes E11.621 Type 2 diabetes mellitus with foot ulcer 03/05/2022 No Yes E66.01 Morbid (severe) obesity due to excess calories 03/05/2022 No Yes Inactive Problems Resolved Problems Electronic Signature(s) Signed: 12/15/2022 3:42:27 PM By: Fredirick Maudlin MD FACS Signed: 12/16/2022 11:58:37 AM By: Kalman Shan DO Entered By: Fredirick Maudlin on 12/15/2022 15:42:26 -------------------------------------------------------------------------------- Progress Note Details Patient Name: Date of Service: [name], [name]. 12/15/2022 3:00 PM Medical Record Number: [id number] Patient Account Number: [id number] Date of Birth/Sex: Treating RN: [birth year] (62 y.o. F) Primary Care Provider: Jenean Lindau Other Clinician: Referring Provider: Treating Provider/Extender: Neita Garnet in Treatment: 63 Subjective Chief Complaint Information obtained from Patient 03/05/2022; left foot wound History of Present Illness (HPI) Admission 03/05/2022 Ms. Bryan Corriveau is [name] 62 year old female with [name] past medical history of uncontrolled insulin-dependent type 2 diabetes,  tobacco user and chronic diastolic heart failure that presents to the clinic for [name] 23-monthhistory of wound to her left heel. She states she had [name] left ankle fusion in September 2022. She states that she always had [name] wound after the surgery and it never healed. She has home health and they have been doing compression wraps along with silver Blasco, Margurite [name] ([id number] Page 5 of 9 alginate to the wound bed. She currently denies signs of infection. 6/8; this is [name] 62year old woman with type 2 diabetes. She developed [name] wound on her left Achilles heel just above the tip of the heel in the setting of recurrent ankle surgeries in late 2022. I have seen some of these results from either the cast or the surgical boots that are put on after these operations. She thinks this may be the case. And [name] sense of pressure ulcer. In any case that she has been using Santyl Hydrofera Blue under compression. She is not wearing any footwear at home as she cannot find anything to accommodate the wrap 6/22; patient presents for follow-up. She has home health that comes out once [name] week to help with dressing changes. She has no issues or complaints today. We have been using Hydrofera Blue under compression therapy. 7/6; patient presents for follow-up. She states that home health did not come out for the past 2 weeks. It is unclear why. We have been using Hydrofera Blue and Santyl under compression therapy. 7/13; patient presents for follow-up. She did not take the oral antibiotics prescribed at last clinic visit. We have been using Hydrofera Blue with gentamicin/mupirocin ointment under compression  therapy. She has no issues or complaints today. She denies signs of infection. 7/20; patient presents for follow-up. We have been using Hydrofera Blue with antibiotic ointment under 3 layer compression. She has home health that change the dressing once. They put collagen on the wound bed. She also reports falling yesterday and hitting her right foot. She has no pain to the area today. 7/27; patient presents for follow-up. We have been using Hydrofera Blue under 3 layer compression. She has home health that changes the dressing. She has no issues or complaints today. 8/3; patient presents for follow-up. We continues to use Hydrofera Blue under 3 layer compression. She has no issues or complaints today. She has been approved for Epicord at 100%. 8/11; patient presents for follow-up. We have been using Hydrofera Blue under 3 layer compression. She has been approved for Epicord and we have this today. She is in agreement with having this applied. 8/18; patient presents for follow-up. Epicord #1 was placed in standard fashion last clinic visit. She has no issues or complaints today. 9/18; Unfortunately patient has missed her last clinic appointments due to falling and breaking her right ankle. We have been following her for her left ankle wound. She has also developed [name] large blister to the left heel over the past week that has ruptured and dried. [facility] W[facility]9/25; patient presents for follow-up. We have been using Hydrofera Blue and Santyl to the original left ankle wound and Xeroform to the dried blistered. We have been wrapping her with compression therapy. She resides in [name] facility and they changed the wrap once this past week. She has no issues or complaints today. She denies signs of infection. 10/3; patient presents for follow-up. We have been using Xeroform to the heel wound and Epicort to the left ankle wound All under compression therapy.  She has no issues or  complaints today. 10/10; patient presents for follow-up. We have been using Epicort to the left ankle wound and Santyl and Hydrofera Blue to the heel wound. All under compression therapy. she has no issues or complaints today. 10/16; patient presents for follow-up. Epicort was placed in standard fashion to the superior wound and onto the heel and Santyl and Hydrofera Blue. She states that the wrap got wet during her shower and her facility was able to rewrap with Kerlix/Coban. 10/23; patient presents for follow-up. Epicord was placed to the wound beds last clinic visit. We continue Kerlix/Coban. She has no issues or complaints today. 10/31; Patient presents for follow-up. Epicord was placed to the wound beds last clinic visit. The original wound is almost healed. We have done this under Kerlix/Coban. She denies signs of infection. 11/10; patient presents for follow-up. Patient's last epi cord was placed in standard fashion at last clinic visit. The original wound has healed. She still has [name] heel wound. Grafix was approved however too costly for the patient. We will rerun epi cord to see if she can have more applications. She had done very well with this. We have been using Hydrofera Blue to the heel under Kerlix/Coban. 11/21; patient presents for follow-up. We have been using Hydrofera Blue and Santyl to the heel under Kerlix/Coban. She switched to [name] new healthcare insurance and again The skin substitute is too costly for the patient. She denies signs of infection. 11/28; patient presents for follow-up. Her facility did not change the wrap last clinic visit due to lack of staffing. The wrap was taken off and Hydrofera Blue dressing has been in place for the past week. She has no issues or complaints today. 12/5; patient presents for follow-up. She states she has had more drainage over the past week. They did not change the wrap at her facility. She states the nurse will be back this week. She is also  been doing [name] lot of physical therapy. She has been using the Prevalon boot while in bed. 12/12; patient presents for follow-up. We have been using Hydrofera Blue with antibiotic ointment under 4-layer compression. She is tolerated the wrap well. She states she is using her Prevalon boot while in the wheelchair. She has no issues or complaints today. There is been improvement in wound healing. 12/19; patient presents for follow-up. We have been using Hydrofera Blue and antibiotic ointment to the wound bed under 4-layer compression. Her facility change the wrap once. She states she has been using the Prevalon boot in bed but not in the wheelchair due to issues with transferring. Overall there is still improvement in wound healing. 1/16; patient presents for follow-up. She missed her last clinic appointment. We have been using Hydrofera Blue and antibiotic ointment under 4-layer compression. At the facility this has only been changed twice over the past 3 weeks. They are not using 4-layer compression. She came in with Kerlix/Coban. 1/23; patient presents for follow-up. We have been using Santyl and Hydrofera Blue under 4-layer compression. Patient has no issues or complaints today. 2/1; patient presents for follow-up. Patient's been using Santyl and Hydrofera Blue to the wound bed. She has developed more slough and now [name] mild odor. She states she is wearing the Prevalon boot at night. It is unclear if she is offloading this area during the day. 2/13; patient presents for follow-up. Patient's been using Dakin's wet-to-dry dressings. She received her Keystone antibiotic ointment in the mail. She has not used  this yet. She reports using her Prevalon boot. She has no issues or complaints today. 2/20; patient presents for follow-up. She has been using Keystone antibiotic ointment with Hydrofera Blue. She has no issues or complaints today. Due to transportation she cannot do the total contact cast today. She is  scheduled for next week to have this placed. 2/27; patient presents for follow-up. Plan is for the total contact cast today. We have been using Keystone and Hydrofera Blue to the wound bed. She forgot her Keystone antibiotic ointment. She has no issues or complaints today. 2/29; patient presents for follow-up. Patient presents for her obligatory cast change. She has no issues or complaints today. 3/5; patient presents for follow-up. She has had the cast in place for the past week and has tolerated this well. She has no issues or complaints today. We have been using PolyMem with Keystone antibiotic ointment. [name], [name] ([id number]) [id number] Page 6 of 9 12/15/2022: The wound is quite clean and measured smaller today. Patient History Information obtained from Patient, Chart. Family History Cancer - Father,Mother, Diabetes - Mother,Maternal Grandparents,Paternal Grandparents, Heart Disease - Siblings,Maternal Grandparents, Hypertension - Siblings, Tuberculosis - Maternal Grandparents,Paternal Grandparents. Social History Current every day smoker, Marital Status - Single, Alcohol Use - Never, Drug Use - No History, Caffeine Use - Daily. 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 no acute distress. Vitals Time Taken: 2:55 PM, Height: 66 in, Weight: 339 lbs, BMI: 54.7, Temperature:  98 F, Pulse: 87 bpm, Respiratory Rate: 20 breaths/min, Blood Pressure: 163/72 mmHg, Capillary Blood Glucose: 116 mg/dl. Respiratory Normal work of breathing on room air. General Notes: 12/15/2022: The wound is quite clean and measured smaller today. Integumentary (Hair, Skin) Wound #2 status is Open. Original cause of wound was Blister. The date acquired was: 06/15/2022. The wound has been in treatment 24 weeks. The wound is located on the Left,Distal Calcaneus. The wound measures 3cm length x 4.2cm width x 0.1cm depth; 9.896cm^2 area and 0.99cm^3 volume. There is Fat Layer (Subcutaneous Tissue) exposed. There is no tunneling or undermining noted. There is [name] medium amount of serosanguineous drainage noted. The wound margin is distinct with the outline attached to the wound base. There is medium (34-66%) red, pink granulation within the wound bed. There is [name] medium (34-66%) amount of necrotic tissue within the wound bed including Adherent Slough. The periwound skin appearance had no abnormalities noted for texture. The periwound skin appearance had no abnormalities noted for color. The periwound skin appearance did not exhibit: Dry/Scaly, Maceration. Periwound temperature was noted as No Abnormality. Assessment Active Problems ICD-10 Non-pressure chronic ulcer of other part of left foot with fat layer exposed Type 2 diabetes mellitus with foot ulcer Morbid (severe) obesity due to excess calories Procedures Wound #2 Chittum, [name] ([id number]) [id number] Page 7 of 9 Pre-procedure diagnosis of Wound #2 is [name] Diabetic Wound/Ulcer of the Lower Extremity located on the Left,Distal Calcaneus . There was [name] T Contact Cast otal Procedure by Fredirick Maudlin, MD. Post procedure Diagnosis Wound #2: Same as Pre-Procedure Plan Follow-up Appointments: Return Appointment in 1 week. -  w/ Dr. Heber [place] and [facility] 8 Tuesday 12/22/22 0800 (already has appt.) Return Appointment  in 2 weeks. - Dr. Heber King 12/29/2022 0800 (already has an appt). Other: - Leave dressing in place. Facility not to change dressing. Anesthetic: (In clinic) Topical Lidocaine 5% applied to wound bed Cellular or Tissue Based Products: Cellular or Tissue Based Product Type: - Run IVR for EpiFix and Epicord=100% covered 05/07/22: Epicord ordered 12/08/2022: [employer] insurance for epicord and theraskin. Cellular or Tissue Based Product applied to wound bed, secured with steri-strips, cover with Adaptic or Mepitel. (DO NOT REMOVE). - Epicord #1 05/15/25 Epicord #2 05/22/22 Epicord #3 06/29/2022 Epicord #4 07/07/2022 Epifix #5 07/14/22 Epicord #6 07/20/22 Epicord # 7 07/27/22 ***Run for Grafix***PENDING Epicord #8 08/04/2022 Edema Control - Lymphedema / SCD / Other: Elevate legs to the level of the heart or above for 30 minutes daily and/or when sitting for 3-4 times [name] day throughout the day. Avoid standing for long periods of time. Off-Loading: T Contact Cast to Left Lower Extremity - size 4 otal Additional Orders / Instructions: Follow Nutritious Diet - Monitor/Control Blood Sugar WOUND #2: - Calcaneus Wound Laterality: Left, Distal Cleanser: Soap and Water 1 x Per Week/30 Days Discharge Instructions: May shower and wash wound with dial antibacterial soap and water prior to dressing change. Cleanser: Wound Cleanser 1 x Per Week/30 Days Discharge Instructions: Cleanse the wound with wound cleanser prior to applying [name] clean dressing using gauze sponges, not tissue or cotton balls. Peri-Wound Care: Zinc Oxide Ointment 30g tube 1 x Per Week/30 Days Discharge Instructions: Apply Zinc Oxide to periwound with each dressing change Topical: Keystone 1 x Per Week/30 Days Prim Dressing: PolyMem Silver Non-Adhesive Dressing, 4.25x4.25 in 1 x Per Week/30 Days ary Discharge Instructions: Apply to wound bed as instructed Secondary Dressing: ABD Pad, 8x10 1 x Per Week/30 Days Discharge Instructions: Apply over primary  dressing as directed. Secondary Dressing: Woven Gauze Sponge, Non-Sterile 4x4 in (Home Health) 1 x Per Week/30 Days Discharge Instructions: Apply over primary dressing as directed. Secured With: [employer], 4.5x3.1 (in/yd) 1 x Per Week/30 Days Discharge Instructions: Secure with Kerlix as directed. 12/15/2022: The wound is quite clean and measured smaller today. The wound was prepared for total contact casting. I think it is clean enough that Santyl is not required. Her Keystone topical antibiotic compound was applied, followed by PolyMem. [name] total contact cast was then applied without complications or difficulty. She will follow-up in 1 week with Dr. Heber Dunmore. Electronic Signature(s) Signed: 12/15/2022 3:45:59 PM By: Fredirick Maudlin MD FACS Entered By: Fredirick Maudlin on 12/15/2022 15:45:59 -------------------------------------------------------------------------------- [name] Details Patient Name: Date of Service: [name], [name]. 12/15/2022 3:00 PM Medical Record Number: [id number] Patient Account Number: [id number] Date of Birth/Sex: Treating RN: 1961/07/06 (62 y.o. F) Primary Care Provider: Jenean Lindau Other Clinician: Referring Provider: Treating Provider/Extender: Neita Garnet in Treatment: 40 Information Obtained From Patient Chart Hematologic/Lymphatic Medical History: Positive for: Anemia Respiratory [name], [name] ([id number]) [id number] Page 8 of 9 Medical History: Positive for: Chronic Obstructive Pulmonary Disease (COPD) Cardiovascular Medical History: Positive for: Congestive Heart Failure - Weighs self daily; Hypertension; Myocardial Infarction; Peripheral Venous Disease Gastrointestinal Medical History: Past Medical History Notes: GERD, Peptic Ulcer Disease Endocrine Medical History: Positive for: Type II Diabetes Time with diabetes: 16 years Treated with: Insulin, Oral agents Blood sugar  tested every day: Yes Tested : Twice daily Musculoskeletal Medical History: Past Medical History Notes: Broke left leg 3 years ago, Fractured ankle  06/2020, foot fused Neurologic Medical History: Positive for: Neuropathy Psychiatric Medical History: Past Medical History Notes: Depression Immunizations Pneumococcal Vaccine: Received Pneumococcal Vaccination: No Implantable Devices None Family and Social History Cancer: Yes - Father,Mother; Diabetes: Yes - Mother,Maternal Grandparents,Paternal Grandparents; Heart Disease: Yes - Siblings,Maternal Grandparents; Hypertension: Yes - Siblings; Tuberculosis: Yes - Maternal Grandparents,Paternal Grandparents; Current every day smoker; Marital Status - Single; Alcohol Use: Never; Drug Use: No History; Caffeine Use: Daily; Financial Concerns: No; Food, Clothing or Shelter Needs: No; Support System Lacking: No; Transportation Concerns: No Electronic Signature(s) Signed: 12/15/2022 3:47:52 PM By: Fredirick Maudlin MD FACS Entered By: Fredirick Maudlin on 12/15/2022 15:44:31 -------------------------------------------------------------------------------- Total Contact Cast Details Patient Name: Date of Service: [name] Neighbor MY [name]. 12/15/2022 3:00 PM Medical Record Number: [id number] Patient Account Number: [id number] Date of Birth/Sex: Treating RN: 08-Jun-1961 (62 y.o. [name] Primary Care Provider: Jenean Lindau Other Clinician: Referring Provider: Treating Provider/Extender: Neita Garnet in Treatment: 51 T Contact Cast Applied for Wound Assessment: otal Wound #2 Left,Distal Calcaneus Performed By: Physician Fredirick Maudlin, MD Post Procedure Diagnosis Same as Pre-procedure [name], [name] ([id number]) [id number] Page 9 of 9 Electronic Signature(s) Signed: 12/15/2022 3:44:06 PM By: Fredirick Maudlin MD FACS Signed: 12/15/2022 4:59:46 PM By: Deon Pilling RN, BSN Entered By:  Deon Pilling on 12/15/2022 15:18:22 -------------------------------------------------------------------------------- SuperBill Details Patient Name: Date of Service: [name]. 12/15/2022 Medical Record Number: [id number] Patient Account Number: [id number] Date of Birth/Sex: Treating RN: 1961-06-18 (62 y.o. [name], [name] Primary Care Provider: Jenean Lindau Other Clinician: Referring Provider: Treating Provider/Extender: Neita Garnet in Treatment: 40 Diagnosis Coding ICD-10 Codes Code Description [id number] Non-pressure chronic ulcer of other part of left foot with fat layer exposed E11.621 Type 2 diabetes mellitus with foot ulcer E66.01 Morbid (severe) obesity due to excess calories Facility Procedures : CPT4 Code: [id number] Description: 29445 - APPLY TOTAL CONTACT LEG CAST ICD-10 Diagnosis Description L97.522 Non-pressure chronic ulcer of other part of left foot with fat layer exposed Modifier: Quantity: 1 Physician Procedures : CPT4 Code Description Modifier [id number] - WC PHYS LEVEL 4 - EST PT ICD-10 Diagnosis Description L97.522 Non-pressure chronic ulcer of other part of left foot with fat layer exposed E11.621 Type 2 diabetes mellitus with foot ulcer E66.01 Morbid  (severe) obesity due to excess calories Quantity: 1 : [id number] - WC PHYS APPLY TOTAL CONTACT CAST ICD-10 Diagnosis Description L97.522 Non-pressure chronic ulcer of other part of left foot with fat layer exposed Quantity: 1 Electronic Signature(s) Signed: 12/15/2022 3:46:16 PM By: Fredirick Maudlin MD FACS Previous Signature: 12/15/2022 3:44:06 PM Version By: Fredirick Maudlin MD FACS Entered By: Fredirick Maudlin on 12/15/2022 15:46:15

## 2022-12-22 ENCOUNTER — Encounter (HOSPITAL_BASED_OUTPATIENT_CLINIC_OR_DEPARTMENT_OTHER): Payer: 59 | Admitting: Internal Medicine

## 2022-12-22 DIAGNOSIS — L97522 Non-pressure chronic ulcer of other part of left foot with fat layer exposed: Secondary | ICD-10-CM

## 2022-12-22 DIAGNOSIS — E11621 Type 2 diabetes mellitus with foot ulcer: Secondary | ICD-10-CM

## 2022-12-26 NOTE — Progress Notes (Signed)
Udall, Shariece [name] ([id number]) [id number] Page 1 of 7 Visit Report for 12/22/2022 Arrival Information Details Patient Name: Date of Service: [name] Lax [name]. 12/22/2022 8:00 [name] M Medical Record Number: [id number] Patient Account Number: [id number] Date of Birth/Sex: Treating RN: 01/04/1961 (62 y.o. F) Primary Care Avree Szczygiel: Jenean Lindau Other Clinician: Referring Rashon Westrup: Treating Nocole Zammit/Extender: Elise Benne in Treatment: 74 Visit Information History Since Last Visit All ordered tests and consults were completed: No Patient Arrived: Wheel Chair Added or deleted any medications: No Arrival Time: 07:54 Any new allergies or adverse reactions: No Accompanied By: self Had [name] fall or experienced change in No Transfer Assistance: None activities of daily living that may affect Patient Identification Verified: Yes risk of falls: Secondary Verification Process Completed: Yes Signs or symptoms of abuse/neglect since last visito No Patient Requires Transmission-Based Precautions: No Hospitalized since last visit: No Patient Has Alerts: No Implantable device outside of the clinic excluding No cellular tissue based products placed in the center since last visit: Pain Present Now: Yes Electronic Signature(s) Signed: 12/23/2022 9:04:37 AM By: Worthy Rancher Entered By: Worthy Rancher on 12/22/2022 07:54:31 -------------------------------------------------------------------------------- Encounter Discharge Information Details Patient Name: Date of Service: [name], [name]. 12/22/2022 8:00 [name] M Medical Record Number: [id number] Patient Account Number: [id number] Date of Birth/Sex: Treating RN: 01/14/1961 (62 y.o. [name], [name] Primary Care Duvall Comes: Jenean Lindau Other Clinician: Referring Hooper Petteway: Treating Kamyiah Colantonio/Extender: Elise Benne in Treatment: 65 Encounter Discharge Information Items Post  Procedure Vitals Discharge Condition: Stable Temperature (F): 98.7 Ambulatory Status: Wheelchair Pulse (bpm): 74 Discharge Destination: Home Respiratory Rate (breaths/min): 17 Transportation: Private Auto Blood Pressure (mmHg): 120/80 Accompanied By: self Schedule Follow-up Appointment: Yes Clinical Summary of Care: Patient Declined Electronic Signature(s) Signed: 12/25/2022 11:12:19 AM By: Rhae Hammock RN Entered By: Rhae Hammock on 12/22/2022 08:57:16 -------------------------------------------------------------------------------- Lower Extremity Assessment Details Patient Name: Date of Service: [name], [name]. 12/22/2022 8:00 [name] M Medical Record Number: [id number] Patient Account Number: [id number] Date of Birth/Sex: Treating RN: 1961-09-15 (62 y.o. [name], [name] Primary Care Leeon Makar: Jenean Lindau Other Clinician: Referring Saahir Prude: Treating Leverne Tessler/Extender: Elise Benne in Treatment: 41 Edema Assessment Assessed: [name]: Yes] Patrice Paradise: No] H[Left: UFFMAN, Mazzie [name] [id number] [Right: [id number] Page 2 of 7] Edema: [Left: Ye] [Right: s] Calf Left: Right: Point of Measurement: 28 cm From Medial Instep 49 cm Ankle Left: Right: Point of Measurement: 3 cm From Medial Instep 29 cm Vascular Assessment Pulses: Dorsalis Pedis Palpable: [Left:Yes] Posterior Tibial Palpable: [Left:Yes] Electronic Signature(s) Signed: 12/25/2022 11:12:19 AM By: Rhae Hammock RN Entered By: Rhae Hammock on 12/22/2022 08:27:35 -------------------------------------------------------------------------------- Multi Wound Chart Details Patient Name: Date of Service: [name] Lax [name]. 12/22/2022 8:00 [name] M Medical Record Number: [id number] Patient Account Number: [id number] Date of Birth/Sex: Treating RN: 04/02/1961 (62 y.o. F) Primary Care Clayvon Parlett: Jenean Lindau Other Clinician: Referring Kedrick Mcnamee: Treating  Alper Guilmette/Extender: Elise Benne in Treatment: 41 Vital Signs Height(in): 66 Capillary Blood Glucose(mg/dl): 119 Weight(lbs): 339 Pulse(bpm): 36 Body Mass Index(BMI): 54.7 Blood Pressure(mmHg): 143/77 Temperature(F): 97.8 Respiratory Rate(breaths/min): 20 [2:Photos:] [[name]:[name]] Left, Distal Calcaneus [name] Wound Location: Blister [name] Wounding Event: Diabetic Wound/Ulcer of the Lower [name] Primary Etiology: Extremity Anemia, Chronic Obstructive [name] Comorbid History: Pulmonary Disease (COPD), Congestive Heart Failure, Hypertension, Myocardial Infarction, Peripheral Venous Disease, Type II Diabetes, Neuropathy 06/15/2022 [name] Date Acquired: 25 [name] Weeks of Treatment: Open [name] Wound Status: No [name] Wound Recurrence: 2.9x4x0.1 [name]  [name] Measurements L x W x D (cm) 9.111 [name] [name] (cm) : rea 0.911 [name] Volume (cm) : 35.60% [name] % Reduction in [name] rea: 35.60% [name] % Reduction in Volume: Grade 2 [name] Classification: Medium [name] Exudate [name] mount: Serosanguineous [name] Exudate Type: red, brown [name] Exudate Color: Wellons, Cecelia [name] ([id number]) [id number] Page 3 of 7 Distinct, outline attached [name] Wound Margin: Medium (34-66%) [name] Granulation Amount: Red, Pink [name] Granulation Quality: Medium (34-66%) [name] Necrotic Amount: Fat Layer (Subcutaneous Tissue): Yes [name] Exposed Structures: Fascia: No Tendon: No Muscle: No Joint: No Bone: No Small (1-33%) [name] Epithelialization: Debridement - Excisional [name] Debridement: Pre-procedure Verification/Time Out 08:28 [name] Taken: Lidocaine [name] Pain Control: Subcutaneous, Slough [name] Tissue Debrided: Skin/Subcutaneous Tissue [name] Level: 11.6 [name] Debridement [name] (sq cm): rea Curette [name] Instrument: Minimum [name] Bleeding: Pressure [name] Hemostasis [name] chieved: 0 [name]  [name] Procedural Pain: 0 [name] Post Procedural Pain: Procedure was tolerated well [name] Debridement Treatment Response: 2.9x4x0.1 [name] Post Debridement Measurements L x W x D (cm) 0.911 [name] Post Debridement Volume: (cm) No Abnormalities Noted [name] Periwound Skin Texture: Maceration: No [name] Periwound Skin Moisture: Dry/Scaly: No No Abnormalities Noted [name] Periwound Skin Color: No Abnormality [name] Temperature: Debridement [name] Procedures Performed: T Contact Cast otal Treatment Notes Wound #2 (Calcaneus) Wound Laterality: Left, Distal Cleanser Soap and Water Discharge Instruction: May shower and wash wound with dial antibacterial soap and water prior to dressing change. Wound Cleanser Discharge Instruction: Cleanse the wound with wound cleanser prior to applying [name] clean dressing using gauze sponges, not tissue or cotton balls. Peri-Wound Care Zinc Oxide Ointment 30g tube Discharge Instruction: Apply Zinc Oxide to periwound with each dressing change Topical [place] Primary Dressing PolyMem Silver Non-Adhesive Dressing, 4.25x4.25 in Discharge Instruction: Apply to wound bed as instructed Secondary Dressing ABD Pad, 8x10 Discharge Instruction: Apply over primary dressing as directed. Woven Gauze Sponge, Non-Sterile 4x4 in Discharge Instruction: Apply over primary dressing as directed. Secured With [employer], 4.5x3.1 (in/yd) Discharge Instruction: Secure with Kerlix as directed. Compression Wrap Compression Stockings Add-Ons Electronic Signature(s) Signed: 12/22/2022 5:03:25 PM By: Kalman Shan DO Entered By: Kalman Shan on 12/22/2022 10:12:24 Flight, Tabbetha [name] ([id number]) [id number] Page 4 of 7 -------------------------------------------------------------------------------- Multi-Disciplinary Care Plan Details Patient Name: Date of Service: [name] Lax [name]. 12/22/2022 8:00 [name] M Medical Record  Number: [id number] Patient Account Number: [id number] Date of Birth/Sex: Treating RN: [birth year] (62 y.o. [name], [name] Primary Care Merwin Breden: Jenean Lindau Other Clinician: Referring Kameisha Malicki: Treating Sean Malinowski/Extender: Elise Benne in Treatment: 97 Active Inactive Abuse / Safety / Falls / Self Care Management Nursing Diagnoses: History of Falls Goals: Patient will not experience any injury related to falls Date Initiated: 03/05/2022 Target Resolution Date: 02/05/2023 Goal Status: Active Interventions: Assess fall risk on admission and as needed Assess: immobility, friction, shearing, incontinence upon admission and as needed Assess impairment of mobility on admission and as needed per policy Notes: [id number]: Fall prevention ongoing, recent fall. Wound/Skin Impairment Nursing Diagnoses: Impaired tissue integrity Goals: Patient/caregiver will verbalize understanding of skin care regimen Date Initiated: 03/05/2022 Target Resolution Date: 02/06/2023 Goal Status: Active Ulcer/skin breakdown will have [name] volume reduction of 30% by week 4 Date Initiated: 03/05/2022 Date Inactivated: 04/30/2022 Target Resolution Date: 05/02/2022 Goal Status: Met Ulcer/skin breakdown will have [name] volume reduction of 50% by  week 8 Date Initiated: 04/30/2022 Date Inactivated: 07/07/2022 Target Resolution Date: 07/04/2022 Unmet Reason: patient sent three Goal Status: Unmet weeks inpatient and rehab before returning to wound center. Interventions: Assess patient/caregiver ability to obtain necessary supplies Assess patient/caregiver ability to perform ulcer/skin care regimen upon admission and as needed Assess ulceration(s) every visit Provide education on smoking Provide education on ulcer and skin care Treatment Activities: Topical wound management initiated : 03/05/2022 Notes: 04/30/22: Wound care regimen continues. Electronic Signature(s) Signed: 12/25/2022 11:12:19 AM  By: Rhae Hammock RN Entered By: Rhae Hammock on 12/22/2022 08:27:49 -------------------------------------------------------------------------------- Pain Assessment Details Patient Name: Date of Service: [name], [name]. 12/22/2022 8:00 [name] M Medical Record Number: [id number] Patient Account Number: [id number] Date of Birth/Sex: Treating RN: 1961/06/21 (62 y.o. F) Primary Care Danielle Lento: Jenean Lindau Other Clinician: Burney Gauze, [name] ([id number]) [id number] Page 5 of 7 Referring Hanley Woerner: Treating La Shehan/Extender: Elise Benne in Treatment: 41 Active Problems Location of Pain Severity and Description of Pain Patient Has Paino Yes Site Locations Rate the pain. Current Pain Level: 4 Worst Pain Level: 10 Least Pain Level: 0 Tolerable Pain Level: 2 Pain Management and Medication Current Pain Management: Electronic Signature(s) Signed: 12/23/2022 9:04:37 AM By: Worthy Rancher Entered By: Worthy Rancher on 12/22/2022 07:55:18 -------------------------------------------------------------------------------- Patient/Caregiver Education Details Patient Name: Date of Service: [name] Crease. 3/19/2024andnbsp8:00 [place] Record Number: [id number] Patient Account Number: [id number] Date of Birth/Gender: Treating RN: 1961/01/16 (62 y.o. [name], [name] Primary Care Physician: Jenean Lindau Other Clinician: Referring Physician: Treating Physician/Extender: Elise Benne in Treatment: 60 Education Assessment Education Provided To: Patient Education Topics Provided Wound/Skin Impairment: Methods: Explain/Verbal Responses: Reinforcements needed, State content correctly [employer]) Signed: 12/25/2022 11:12:19 AM By: Rhae Hammock RN Entered By: Rhae Hammock on 12/22/2022 08:28:01 -------------------------------------------------------------------------------- Wound  Assessment Details Patient Name: Date of Service: [name] Lax [name]. 12/22/2022 8:00 [name] M Medical Record Number: [id number] Patient Account Number: [id number] Date of Birth/Sex: Treating RN: 05-12-1961 (62 y.o. F) Primary Care Legaci Tarman: Jenean Lindau Other Clinician: Referring Coleta Grosshans: Treating Atticus Wedin/Extender: Elise Benne in Treatment: [address] [name] ([id number]) [id number] Page 6 of 7 Wound Status Wound Number: 2 Primary Diabetic Wound/Ulcer of the Lower Extremity Etiology: Wound Location: Left, Distal Calcaneus Wound Open Wounding Event: Blister Status: Date Acquired: 06/15/2022 Comorbid Anemia, Chronic Obstructive Pulmonary Disease (COPD), Weeks Of Treatment: 25 History: Congestive Heart Failure, Hypertension, Myocardial Infarction, Clustered Wound: No Peripheral Venous Disease, Type II Diabetes, Neuropathy Photos Wound Measurements Length: (cm) 2.9 Width: (cm) 4 Depth: (cm) 0.1 Area: (cm) 9.111 Volume: (cm) 0.911 % Reduction in Area: 35.6% % Reduction in Volume: 35.6% Epithelialization: Small (1-33%) Wound Description Classification: Grade 2 Wound Margin: Distinct, outline attached Exudate Amount: Medium Exudate Type: Serosanguineous Exudate Color: red, brown Foul Odor After Cleansing: No Slough/Fibrino Yes Wound Bed Granulation Amount: Medium (34-66%) Exposed Structure Granulation Quality: Red, Pink Fascia Exposed: No Necrotic Amount: Medium (34-66%) Fat Layer (Subcutaneous Tissue) Exposed: Yes Necrotic Quality: Adherent Slough Tendon Exposed: No Muscle Exposed: No Joint Exposed: No Bone Exposed: No Periwound Skin Texture Texture Color No Abnormalities Noted: Yes No Abnormalities Noted: Yes Moisture Temperature / Pain No Abnormalities Noted: No Temperature: No Abnormality Dry / Scaly: No Maceration: No Treatment Notes Wound #2 (Calcaneus) Wound Laterality: Left, Distal Cleanser Soap  and Water Discharge Instruction: May shower and wash wound with dial antibacterial soap and water prior to dressing change. Wound Cleanser Discharge Instruction: Cleanse the wound with wound cleanser prior to  applying [name] clean dressing using gauze sponges, not tissue or cotton balls. Peri-Wound Care Zinc Oxide Ointment 30g tube Discharge Instruction: Apply Zinc Oxide to periwound with each dressing change Topical [facility] Dressing [name], [name] ([id number]) [id number] Page 7 of 7 PolyMem Silver Non-Adhesive Dressing, 4.25x4.25 in Discharge Instruction: Apply to wound bed as instructed Secondary Dressing ABD Pad, 8x10 Discharge Instruction: Apply over primary dressing as directed. Woven Gauze Sponge, Non-Sterile 4x4 in Discharge Instruction: Apply over primary dressing as directed. Secured With [employer], 4.5x3.1 (in/yd) Discharge Instruction: Secure with Kerlix as directed. Compression Wrap Compression Stockings Add-Ons Electronic Signature(s) Signed: 12/23/2022 9:04:37 AM By: Worthy Rancher Entered By: Worthy Rancher on 12/22/2022 08:13:22 -------------------------------------------------------------------------------- Vitals Details Patient Name: Date of Service: [name] [name], [name]. 12/22/2022 8:00 [name] M Medical Record Number: [id number] Patient Account Number: [id number] Date of Birth/Sex: Treating RN: [birth year] (62 y.o. F) Primary Care Sharae Zappulla: Jenean Lindau Other Clinician: Referring Zarrah Loveland: Treating Jatoria Kneeland/Extender: Elise Benne in Treatment: 41 Vital Signs Time Taken: 07:54 Temperature (F): 97.8 Height (in): 66 Pulse (bpm): 88 Weight (lbs): 339 Respiratory Rate (breaths/min): 20 Body Mass Index (BMI): 54.7 Blood Pressure (mmHg): 143/77 Capillary Blood Glucose (mg/dl): 119 Reference Range: 80 - 120 mg / dl Electronic Signature(s) Signed: 12/23/2022 9:04:37 AM By: Worthy Rancher Entered By:  Worthy Rancher on 12/22/2022 07:54:59

## 2022-12-26 NOTE — Progress Notes (Signed)
[name] ([id number]) [id number] Page 1 of 10 Visit Report for 12/22/2022 Chief Complaint Document Details Patient Name: Date of Service: [name] Lax [name]. 12/22/2022 8:00 A M Medical Record Number: [id number] Patient Account Number: [id number] Date of Birth/Sex: Treating RN: 02-10-1961 (62 y.o. F) Primary Care Provider: Jenean Lindau Other Clinician: Referring Provider: Treating Provider/Extender: Elise Benne in [id number] Information Obtained from: Patient Chief Complaint 03/05/2022; left foot wound Electronic Signature(s) Signed: 12/22/2022 5:03:25 PM By: Kalman Shan DO Entered By: Kalman Shan on 12/22/2022 10:12:32 -------------------------------------------------------------------------------- Debridement Details Patient Name: Date of Service: [name] Lax [name]. 12/22/2022 8:00 A M Medical Record Number: [id number] Patient Account Number: [id number] Date of Birth/Sex: Treating RN: [birth year] (62 y.o. [name], [name] Primary Care Provider: Jenean Lindau Other Clinician: Referring Provider: Treating Provider/Extender: Elise Benne in Treatment: 41 Debridement Performed for Assessment: Wound #2 Left,Distal Calcaneus Performed By: Physician Kalman Shan, DO Debridement Type: Debridement Severity of Tissue Pre Debridement: Fat layer exposed Level of Consciousness (Pre-procedure): Awake and Alert Pre-procedure Verification/Time Out Yes - 08:28 Taken: Start Time: 08:28 Pain Control: Lidocaine T Area Debrided (L x W): otal 2.9 (cm) x 4 (cm) = 11.6 (cm) Tissue and other material debrided: Viable, Non-Viable, Slough, Subcutaneous, Slough Level: Skin/Subcutaneous Tissue Debridement Description: Excisional Instrument: Curette Bleeding: Minimum Hemostasis Achieved: Pressure End Time: 08:28 Procedural Pain: 0 Post Procedural Pain: 0 Response to Treatment:  Procedure was tolerated well Level of Consciousness (Post- Awake and Alert procedure): Post Debridement Measurements of Total Wound Length: (cm) 2.9 Width: (cm) 4 Depth: (cm) 0.1 Volume: (cm) 0.911 Character of Wound/Ulcer Post Debridement: Improved Severity of Tissue Post Debridement: Fat layer exposed Post Procedure Diagnosis Same as Pre-procedure Electronic Signature(s) Signed: 12/22/2022 5:03:25 PM By: Kalman Shan DO Signed: 12/25/2022 11:12:19 AM By: Rhae Hammock RN Entered By: Rhae Hammock on 12/22/2022 08:30:11 Snelson, Caryn [name] ([id number]) [id number] Page 2 of 10 -------------------------------------------------------------------------------- HPI Details Patient Name: Date of Service: [name] Lax [name]. 12/22/2022 8:00 A M Medical Record Number: [id number] Patient Account Number: [id number] Date of Birth/Sex: Treating RN: September 26, 1961 (62 y.o. F) Primary Care Provider: Jenean Lindau Other Clinician: Referring Provider: Treating Provider/Extender: Elise Benne in Treatment: 41 History of Present Illness HPI Description: Admission 03/05/2022 [name] is a 62 year old female with a past medical history of uncontrolled insulin-dependent type 2 diabetes, tobacco user and chronic diastolic heart failure that presents to the clinic for a 70-month history of wound to her left heel. She states she had a left ankle fusion in September 2022. She states that she always had a wound after the surgery and it never healed. She has home health and they have been doing compression wraps along with silver alginate to the wound bed. She currently denies signs of infection. 6/8; this is a 62 year old woman with type 2 diabetes. She developed a wound on her left Achilles heel just above the tip of the heel in the setting of recurrent ankle surgeries in late 2022. I have seen some of these results from either the cast or the  surgical boots that are put on after these operations. She thinks this may be the case. And a sense of pressure ulcer. In any case that she has been using Santyl Hydrofera Blue under compression. She is not wearing any footwear at home as she cannot find anything to accommodate the wrap 6/22; patient presents for follow-up. She has home health that comes out once a week to  help with dressing changes. She has no issues or complaints today. We have been using Hydrofera Blue under compression therapy. 7/6; patient presents for follow-up. She states that home health did not come out for the past 2 weeks. It is unclear why. We have been using Hydrofera Blue and Santyl under compression therapy. 7/13; patient presents for follow-up. She did not take the oral antibiotics prescribed at last clinic visit. We have been using Hydrofera Blue with gentamicin/mupirocin ointment under compression therapy. She has no issues or complaints today. She denies signs of infection. 7/20; patient presents for follow-up. We have been using Hydrofera Blue with antibiotic ointment under 3 layer compression. She has home health that change the dressing once. They put collagen on the wound bed. She also reports falling yesterday and hitting her right foot. She has no pain to the area today. 7/27; patient presents for follow-up. We have been using Hydrofera Blue under 3 layer compression. She has home health that changes the dressing. She has no issues or complaints today. 8/3; patient presents for follow-up. We continues to use Hydrofera Blue under 3 layer compression. She has no issues or complaints today. She has been approved for Epicord at 100%. 8/11; patient presents for follow-up. We have been using Hydrofera Blue under 3 layer compression. She has been approved for Epicord and we have this today. She is in agreement with having this applied. 8/18; patient presents for follow-up. Epicord #1 was placed in standard fashion  last clinic visit. She has no issues or complaints today. 9/18; Unfortunately patient has missed her last clinic appointments due to falling and breaking her right ankle. We have been following her for her left ankle wound. She has also developed a large blister to the left heel over the past week that has ruptured and dried. She currently resides In [facility]. Wound9/25; patient presents for follow-up. We have been using Hydrofera Blue and Santyl to the original left ankle wound and Xeroform to the dried blistered. We have been wrapping her with compression therapy. She resides in a facility and they changed the wrap once this past week. She has no issues or complaints today. She denies signs of infection. 10/3; patient presents for follow-up. We have been using Xeroform to the heel wound and Epicort to the left ankle wound All under compression therapy. She has no issues or complaints today. 10/10; patient presents for follow-up. We have been using Epicort to the left ankle wound and Santyl and Hydrofera Blue to the heel wound. All under compression therapy. she has no issues or complaints today. 10/16; patient presents for follow-up. Epicort was placed in standard fashion to the superior wound and onto the heel and Santyl and Hydrofera Blue. She states that the wrap got wet during her shower and her facility was able to rewrap with Kerlix/Coban. 10/23; patient presents for follow-up. Epicord was placed to the wound beds last clinic visit. We continue Kerlix/Coban. She has no issues or complaints today. 10/31; Patient presents for follow-up. Epicord was placed to the wound beds last clinic visit. The original wound is almost healed. We have done this under Kerlix/Coban. She denies signs of infection. 11/10; patient presents for follow-up. Patient's last epi cord was placed in standard fashion at last clinic visit. The original wound has healed. She still has a heel wound. Grafix was approved  however too costly for the patient. We will rerun epi cord to see if she can have more applications. She had done very well  with this. We have been using Hydrofera Blue to the heel under Kerlix/Coban. 11/21; patient presents for follow-up. We have been using Hydrofera Blue and Santyl to the heel under Kerlix/Coban. She switched to a new healthcare insurance and again The skin substitute is too costly for the patient. She denies signs of infection. 11/28; patient presents for follow-up. Her facility did not change the wrap last clinic visit due to lack of staffing. The wrap was taken off and Hydrofera Blue dressing has been in place for the past week. She has no issues or complaints today. 12/5; patient presents for follow-up. She states she has had more drainage over the past week. They did not change the wrap at her facility. She states the nurse will be back this week. She is also been doing a lot of physical therapy. She has been using the Prevalon boot while in bed. 12/12; patient presents for follow-up. We have been using Hydrofera Blue with antibiotic ointment under 4-layer compression. She is tolerated the wrap well. She states she is using her Prevalon boot while in the wheelchair. She has no issues or complaints today. There is been improvement in wound healing. 12/19; patient presents for follow-up. We have been using Hydrofera Blue and antibiotic ointment to the wound bed under 4-layer compression. Her facility change the wrap once. She states she has been using the Prevalon boot in bed but not in the wheelchair due to issues with transferring. Overall there is still Pizzini, Anastasya [name] ([id number]) [id number] Page 3 of 10 improvement in wound healing. 1/16; patient presents for follow-up. She missed her last clinic appointment. We have been using Hydrofera Blue and antibiotic ointment under 4-layer compression. At the facility this has only been changed twice over  the past 3 weeks. They are not using 4-layer compression. She came in with Kerlix/Coban. 1/23; patient presents for follow-up. We have been using Santyl and Hydrofera Blue under 4-layer compression. Patient has no issues or complaints today. 2/1; patient presents for follow-up. Patient's been using Santyl and Hydrofera Blue to the wound bed. She has developed more slough and now a mild odor. She states she is wearing the Prevalon boot at night. It is unclear if she is offloading this area during the day. 2/13; patient presents for follow-up. Patient's been using Dakin's wet-to-dry dressings. She received her Keystone antibiotic ointment in the mail. She has not used this yet. She reports using her Prevalon boot. She has no issues or complaints today. 2/20; patient presents for follow-up. She has been using Keystone antibiotic ointment with Hydrofera Blue. She has no issues or complaints today. Due to transportation she cannot do the total contact cast today. She is scheduled for next week to have this placed. 2/27; patient presents for follow-up. Plan is for the total contact cast today. We have been using Keystone and Hydrofera Blue to the wound bed. She forgot her Keystone antibiotic ointment. She has no issues or complaints today. 2/29; patient presents for follow-up. Patient presents for her obligatory cast change. She has no issues or complaints today. 3/5; patient presents for follow-up. She has had the cast in place for the past week and has tolerated this well. She has no issues or complaints today. We have been using PolyMem with Keystone antibiotic ointment. 12/15/2022: The wound is quite clean and measured smaller today. 3/19; we have been using Keystone antibiotic ointment with PolyMem silver under the total contact cast. Overall there is been improvement in wound healing. Electronic Signature(s) Signed: 12/22/2022  5:03:25 PM By: Kalman Shan DO Entered By: Kalman Shan on  12/22/2022 10:13:18 -------------------------------------------------------------------------------- Physical Exam Details Patient Name: Date of Service: [name], [name]. 12/22/2022 8:00 A M Medical Record Number: [id number] Patient Account Number: [id number] Date of Birth/Sex: Treating RN: 09/09/1961 (62 y.o. F) Primary Care Provider: Jenean Lindau Other Clinician: Referring Provider: Treating Provider/Extender: Elise Benne in Treatment: 41 Constitutional respirations regular, non-labored and within target range for patient.. Cardiovascular 2+ dorsalis pedis/posterior tibialis pulses. Psychiatric pleasant and cooperative. Notes Open wound to the left heel with granulation tissue and nonviable tissue. No signs of surrounding infection. Electronic Signature(s) Signed: 12/22/2022 5:03:25 PM By: Kalman Shan DO Entered By: Kalman Shan on 12/22/2022 10:13:52 -------------------------------------------------------------------------------- Physician Orders Details Patient Name: Date of Service: [name], [name]. 12/22/2022 8:00 A M Medical Record Number: [id number] Patient Account Number: [id number] Date of Birth/Sex: Treating RN: 1961-07-17 (62 y.o. [name], [name] Primary Care Provider: Jenean Lindau Other Clinician: Referring Provider: Treating Provider/Extender: Elise Benne in [id number] Verbal / Phone Orders: No Diagnosis Coding Follow-up Appointments ppointment in 1 week. - w/ Dr. Heber Cordova and [place] Rm # 8 Tuesday 12/29/2022 @ 0800 Return A Other: - Leave dressing in place. Labine, Tamlyn [name] ([id number]) [id number] Page 4 of 10 Facility not to change dressing. Anesthetic (In clinic) Topical Lidocaine 5% applied to wound bed Cellular or Tissue Based Products Cellular or Tissue Based Product Type: - Run IVR for EpiFix and Epicord=100% covered 05/07/22: Epicord  ordered 12/08/2022: [employer] insurance for epicord and theraskin. daptic or Mepitel. (DO NOT REMOVE). - Cellular or Tissue Based Product applied to wound bed, secured with steri-strips, cover with A Epicord #1 05/15/25 Epicord #2 05/22/22 Epicord #3 06/29/2022 Epicord #4 07/07/2022 Epifix #5 07/14/22 Epicord #6 07/20/22 Epicord # 7 07/27/22 ***Run for Grafix***PENDING Epicord #8 08/04/2022 Edema Control - Lymphedema / SCD / Other Elevate legs to the level of the heart or above for 30 minutes daily and/or when sitting for 3-4 times a day throughout the day. Avoid standing for long periods of time. Off-Loading Total Contact Cast to Left Lower Extremity - size 4 Additional Orders / Instructions Follow Nutritious Diet - Monitor/Control Blood Sugar Wound Treatment Wound #2 - Calcaneus Wound Laterality: Left, Distal Cleanser: Soap and Water 1 x Per Week/30 Days Discharge Instructions: May shower and wash wound with dial antibacterial soap and water prior to dressing change. Cleanser: Wound Cleanser 1 x Per Week/30 Days Discharge Instructions: Cleanse the wound with wound cleanser prior to applying a clean dressing using gauze sponges, not tissue or cotton balls. Peri-Wound Care: Zinc Oxide Ointment 30g tube 1 x Per Week/30 Days Discharge Instructions: Apply Zinc Oxide to periwound with each dressing change Topical: Keystone 1 x Per Week/30 Days Prim Dressing: PolyMem Silver Non-Adhesive Dressing, 4.25x4.25 in 1 x Per Week/30 Days ary Discharge Instructions: Apply to wound bed as instructed Secondary Dressing: ABD Pad, 8x10 1 x Per Week/30 Days Discharge Instructions: Apply over primary dressing as directed. Secondary Dressing: Woven Gauze Sponge, Non-Sterile 4x4 in (Home Health) 1 x Per Week/30 Days Discharge Instructions: Apply over primary dressing as directed. Secured With: [employer], 4.5x3.1 (in/yd) 1 x Per Week/30 Days Discharge Instructions: Secure with Kerlix as  directed. Consults Physical Therapy - Pt. may only have physical therapy for upper body. No physical therapy right now for lower body. Electronic Signature(s) Signed: 12/22/2022 5:03:25 PM By: Kalman Shan DO Entered By: Kalman Shan on 12/22/2022 10:14:02 -------------------------------------------------------------------------------- Problem List Details  Patient Name: Date of Service: [name] Lax [name]. 12/22/2022 8:00 A M Medical Record Number: [id number] Patient Account Number: [id number] Date of Birth/Sex: Treating RN: [birth year] (62 y.o. F) Primary Care Provider: Jenean Lindau Other Clinician: Referring Provider: Treating Provider/Extender: Elise Benne in Treatment: 41 Active Problems Saksa, Panda [name] ([id number]) [id number] Page 5 of 10 ICD-10 Encounter Code Description Active Date MDM Diagnosis L97.522 Non-pressure chronic ulcer of other part of left foot with fat layer exposed 03/05/2022 No Yes E11.621 Type 2 diabetes mellitus with foot ulcer 03/05/2022 No Yes E66.01 Morbid (severe) obesity due to excess calories 03/05/2022 No Yes Inactive Problems Resolved Problems Electronic Signature(s) Signed: 12/22/2022 5:03:25 PM By: Kalman Shan DO Entered By: Kalman Shan on 12/22/2022 10:12:18 -------------------------------------------------------------------------------- Progress Note Details Patient Name: Date of Service: [name] Lax [name]. 12/22/2022 8:00 A M Medical Record Number: [id number] Patient Account Number: [id number] Date of Birth/Sex: Treating RN: 1961/08/26 (62 y.o. F) Primary Care Provider: Jenean Lindau Other Clinician: Referring Provider: Treating Provider/Extender: Elise Benne in Treatment: 41 Subjective Chief Complaint Information obtained from Patient 03/05/2022; left foot wound History of Present Illness (HPI) Admission 03/05/2022 Ms. Devonia Reller is a  62 year old female with a past medical history of uncontrolled insulin-dependent type 2 diabetes, tobacco user and chronic diastolic heart failure that presents to the clinic for a 71-month history of wound to her left heel. She states she had a left ankle fusion in September 2022. She states that she always had a wound after the surgery and it never healed. She has home health and they have been doing compression wraps along with silver alginate to the wound bed. She currently denies signs of infection. 6/8; this is a 62 year old woman with type 2 diabetes. She developed a wound on her left Achilles heel just above the tip of the heel in the setting of recurrent ankle surgeries in late 2022. I have seen some of these results from either the cast or the surgical boots that are put on after these operations. She thinks this may be the case. And a sense of pressure ulcer. In any case that she has been using Santyl Hydrofera Blue under compression. She is not wearing any footwear at home as she cannot find anything to accommodate the wrap 6/22; patient presents for follow-up. She has home health that comes out once a week to help with dressing changes. She has no issues or complaints today. We have been using Hydrofera Blue under compression therapy. 7/6; patient presents for follow-up. She states that home health did not come out for the past 2 weeks. It is unclear why. We have been using Hydrofera Blue and Santyl under compression therapy. 7/13; patient presents for follow-up. She did not take the oral antibiotics prescribed at last clinic visit. We have been using Hydrofera Blue with gentamicin/mupirocin ointment under compression therapy. She has no issues or complaints today. She denies signs of infection. 7/20; patient presents for follow-up. We have been using Hydrofera Blue with antibiotic ointment under 3 layer compression. She has home health that change the dressing once. They put collagen on  the wound bed. She also reports falling yesterday and hitting her right foot. She has no pain to the area today. 7/27; patient presents for follow-up. We have been using Hydrofera Blue under 3 layer compression. She has home health that changes the dressing. She has no issues or complaints today. 8/3; patient presents for follow-up. We continues to use Hydrofera Blue under  3 layer compression. She has no issues or complaints today. She has been approved for Epicord at 100%. 8/11; patient presents for follow-up. We have been using Hydrofera Blue under 3 layer compression. She has been approved for Epicord and we have this today. She is in agreement with having this applied. 8/18; patient presents for follow-up. Epicord #1 was placed in standard fashion last clinic visit. She has no issues or complaints today. 9/18; Unfortunately patient has missed her last clinic appointments due to falling and breaking her right ankle. We have been following her for her left ankle wound. She has also developed a large blister to the left heel over the past week that has ruptured and dried. She currently resides In [facility] Koo, Sheyann [name] ([id number]) [id number] Page 6 of 10 Wound9/25; patient presents for follow-up. We have been using Hydrofera Blue and Santyl to the original left ankle wound and Xeroform to the dried blistered. We have been wrapping her with compression therapy. She resides in a facility and they changed the wrap once this past week. She has no issues or complaints today. She denies signs of infection. 10/3; patient presents for follow-up. We have been using Xeroform to the heel wound and Epicort to the left ankle wound All under compression therapy. She has no issues or complaints today. 10/10; patient presents for follow-up. We have been using Epicort to the left ankle wound and Santyl and Hydrofera Blue to the heel wound. All under compression therapy. she has  no issues or complaints today. 10/16; patient presents for follow-up. Epicort was placed in standard fashion to the superior wound and onto the heel and Santyl and Hydrofera Blue. She states that the wrap got wet during her shower and her facility was able to rewrap with Kerlix/Coban. 10/23; patient presents for follow-up. Epicord was placed to the wound beds last clinic visit. We continue Kerlix/Coban. She has no issues or complaints today. 10/31; Patient presents for follow-up. Epicord was placed to the wound beds last clinic visit. The original wound is almost healed. We have done this under Kerlix/Coban. She denies signs of infection. 11/10; patient presents for follow-up. Patient's last epi cord was placed in standard fashion at last clinic visit. The original wound has healed. She still has a heel wound. Grafix was approved however too costly for the patient. We will rerun epi cord to see if she can have more applications. She had done very well with this. We have been using Hydrofera Blue to the heel under Kerlix/Coban. 11/21; patient presents for follow-up. We have been using Hydrofera Blue and Santyl to the heel under Kerlix/Coban. She switched to a new healthcare insurance and again The skin substitute is too costly for the patient. She denies signs of infection. 11/28; patient presents for follow-up. Her facility did not change the wrap last clinic visit due to lack of staffing. The wrap was taken off and Hydrofera Blue dressing has been in place for the past week. She has no issues or complaints today. 12/5; patient presents for follow-up. She states she has had more drainage over the past week. They did not change the wrap at her facility. She states the nurse will be back this week. She is also been doing a lot of physical therapy. She has been using the Prevalon boot while in bed. 12/12; patient presents for follow-up. We have been using Hydrofera Blue with antibiotic ointment under  4-layer compression. She is tolerated the wrap well. She states she is using  her Prevalon boot while in the wheelchair. She has no issues or complaints today. There is been improvement in wound healing. 12/19; patient presents for follow-up. We have been using Hydrofera Blue and antibiotic ointment to the wound bed under 4-layer compression. Her facility change the wrap once. She states she has been using the Prevalon boot in bed but not in the wheelchair due to issues with transferring. Overall there is still improvement in wound healing. 1/16; patient presents for follow-up. She missed her last clinic appointment. We have been using Hydrofera Blue and antibiotic ointment under 4-layer compression. At the facility this has only been changed twice over the past 3 weeks. They are not using 4-layer compression. She came in with Kerlix/Coban. 1/23; patient presents for follow-up. We have been using Santyl and Hydrofera Blue under 4-layer compression. Patient has no issues or complaints today. 2/1; patient presents for follow-up. Patient's been using Santyl and Hydrofera Blue to the wound bed. She has developed more slough and now a mild odor. She states she is wearing the Prevalon boot at night. It is unclear if she is offloading this area during the day. 2/13; patient presents for follow-up. Patient's been using Dakin's wet-to-dry dressings. She received her Keystone antibiotic ointment in the mail. She has not used this yet. She reports using her Prevalon boot. She has no issues or complaints today. 2/20; patient presents for follow-up. She has been using Keystone antibiotic ointment with Hydrofera Blue. She has no issues or complaints today. Due to transportation she cannot do the total contact cast today. She is scheduled for next week to have this placed. 2/27; patient presents for follow-up. Plan is for the total contact cast today. We have been using Keystone and Hydrofera Blue to the wound bed.  She forgot her Keystone antibiotic ointment. She has no issues or complaints today. 2/29; patient presents for follow-up. Patient presents for her obligatory cast change. She has no issues or complaints today. 3/5; patient presents for follow-up. She has had the cast in place for the past week and has tolerated this well. She has no issues or complaints today. We have been using PolyMem with Keystone antibiotic ointment. 12/15/2022: The wound is quite clean and measured smaller today. 3/19; we have been using Keystone antibiotic ointment with PolyMem silver under the total contact cast. Overall there is been improvement in wound healing. Patient History Information obtained from Patient, Chart. Family History Cancer - Father,Mother, Diabetes - Mother,Maternal Grandparents,Paternal Grandparents, Heart Disease - Siblings,Maternal Grandparents, Hypertension - Siblings, Tuberculosis - Maternal Grandparents,Paternal Grandparents. Social History Current every day smoker, Marital Status - Single, Alcohol Use - Never, Drug Use - No History, Caffeine Use - Daily. 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 Vitals Time Taken: 7:54 AM, Height: 66 in, Weight: 339 lbs, BMI: 54.7, Temperature: 97.8 F,  Pulse: 88 bpm, Respiratory Rate: 20 breaths/min, Blood Pressure: 143/77 mmHg, Capillary Blood Glucose: 119 mg/dl. Cardiovascular 2+ dorsalis pedis/posterior tibialis pulses. Psychiatric pleasant and cooperative. General Notes:  Open wound to the left heel with granulation tissue and nonviable tissue. No signs of surrounding infection. Integumentary (Hair, Skin) Wound #2 status is Open. Original cause of wound was Blister. The date acquired was: 06/15/2022. The wound has been in treatment 25 weeks. The wound is located on the Left,Distal Calcaneus. The wound measures 2.9cm length x 4cm width x 0.1cm depth; 9.111cm^2 area and 0.911cm^3 volume. There is Fat Layer (Subcutaneous Tissue) exposed. There is a medium amount of serosanguineous drainage noted. The wound margin is distinct with the outline attached to the wound base. There is medium (34-66%) red, pink granulation within the wound bed. There is a medium (34-66%) amount of necrotic tissue within the wound bed including Adherent Slough. The periwound skin appearance had no abnormalities noted for texture. The periwound skin appearance had no abnormalities noted for color. The periwound skin appearance did not exhibit: Dry/Scaly, Maceration. Periwound temperature was noted as No Abnormality. Assessment Active Problems ICD-10 Non-pressure chronic ulcer of other part of left foot with fat layer exposed Type 2 diabetes mellitus with foot ulcer Morbid (severe) obesity due to excess calories Patient's wound has shown improvement in size and appearance since last clinic visit. I debrided nonviable tissue. I recommended continuing the course with PolyMem silver and antibiotic ointment under the total contact cast. Follow-up in 1 week. 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 There was a  Excisional Skin/Subcutaneous Tissue Debridement with a total area of 11.6 sq cm performed by Kalman Shan, DO. With the following instrument(s): Curette to remove Viable and Non-Viable tissue/material. Material removed includes Subcutaneous Tissue and Slough and after achieving pain control using Lidocaine. No specimens were taken. A time out was conducted at 08:28, prior to the start of the procedure. A Minimum amount of bleeding was controlled with Pressure. The procedure was tolerated well with a pain level of 0 throughout and a pain level of 0 following the procedure. Post Debridement Measurements: 2.9cm length x 4cm width x 0.1cm depth; 0.911cm^3 volume. Character of Wound/Ulcer Post Debridement is improved. Severity of Tissue Post Debridement is: Fat layer exposed. 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 There was a T Programmer, multimedia otal Procedure by Kalman Shan, DO. Post procedure Diagnosis Wound #2: Same as Pre-Procedure Plan Follow-up Appointments: Return Appointment in 1 week. - w/ Dr. Heber [place] and [place] Rm # 8 Tuesday 12/29/2022 @ 0800 Other: - Leave dressing in place. Facility not to change dressing. Whipp, Patrizia [name] ([id number]) [id number] Page 8 of 10 Anesthetic: (In clinic) Topical Lidocaine 5% applied to wound bed Cellular or Tissue Based Products: Cellular or Tissue Based Product Type: - Run IVR for EpiFix and Epicord=100% covered 05/07/22: Epicord ordered 12/08/2022: [employer] insurance for epicord and theraskin. Cellular or Tissue Based Product applied to wound bed, secured with steri-strips, cover with Adaptic or Mepitel. (DO NOT REMOVE). - Epicord #1 05/15/25 Epicord #2 05/22/22 Epicord #3 06/29/2022 Epicord #4 07/07/2022 Epifix #5 07/14/22 Epicord #6 07/20/22 Epicord # 7 07/27/22 ***Run for Grafix***PENDING Epicord  #8 08/04/2022 Edema Control - Lymphedema / SCD / Other: Elevate legs to the level of the heart or above for 30 minutes daily and/or when sitting for 3-4 times a day throughout the day. Avoid standing for long periods of time. Off-Loading: T  Contact Cast to Left Lower Extremity - size 4 otal Additional Orders / Instructions: Follow Nutritious Diet - Monitor/Control Blood Sugar Consults ordered were: Physical Therapy - Pt. may only have physical therapy for upper body. No physical therapy right now for lower body. WOUND #2: - Calcaneus Wound Laterality: Left, Distal Cleanser: Soap and Water 1 x Per Week/30 Days Discharge Instructions: May shower and wash wound with dial antibacterial soap and water prior to dressing change. Cleanser: Wound Cleanser 1 x Per Week/30 Days Discharge Instructions: Cleanse the wound with wound cleanser prior to applying a clean dressing using gauze sponges, not tissue or cotton balls. Peri-Wound Care: Zinc Oxide Ointment 30g tube 1 x Per Week/30 Days Discharge Instructions: Apply Zinc Oxide to periwound with each dressing change Topical: Keystone 1 x Per Week/30 Days Prim Dressing: PolyMem Silver Non-Adhesive Dressing, 4.25x4.25 in 1 x Per Week/30 Days ary Discharge Instructions: Apply to wound bed as instructed Secondary Dressing: ABD Pad, 8x10 1 x Per Week/30 Days Discharge Instructions: Apply over primary dressing as directed. Secondary Dressing: Woven Gauze Sponge, Non-Sterile 4x4 in (Home Health) 1 x Per Week/30 Days Discharge Instructions: Apply over primary dressing as directed. Secured With: [employer], 4.5x3.1 (in/yd) 1 x Per Week/30 Days Discharge Instructions: Secure with Kerlix as directed. 1. PolyMem silver with antibiotic ointment 2. T contact cast placed in standard fashion otal 3. Follow-up in 1 week Electronic Signature(s) Signed: 12/22/2022 5:03:25 PM By: Kalman Shan DO Entered By: Kalman Shan on 12/22/2022  10:14:49 -------------------------------------------------------------------------------- HxROS Details Patient Name: Date of Service: [name], [name]. 12/22/2022 8:00 A M Medical Record Number: [id number] Patient Account Number: [id number] Date of Birth/Sex: Treating RN: Aug 26, 1961 (62 y.o. F) Primary Care Provider: Jenean Lindau Other Clinician: Referring Provider: Treating Provider/Extender: Elise Benne in Treatment: 41 Information Obtained From Patient Chart Hematologic/Lymphatic Medical History: Positive for: Anemia Respiratory Medical History: Positive for: Chronic Obstructive Pulmonary Disease (COPD) Cardiovascular Medical History: Positive for: Congestive Heart Failure - Weighs self daily; Hypertension; Myocardial Infarction; Peripheral Venous Disease Gastrointestinal Medical History: Past Medical History Notes: GERD, Peptic Ulcer Disease Sarti, Oluwadara [name] ([id number]) [id number] Page 9 of 10 Endocrine Medical History: Positive for: Type II Diabetes Time with diabetes: 16 years Treated with: Insulin, Oral agents Blood sugar tested every day: Yes Tested : Twice daily Musculoskeletal Medical History: Past Medical History Notes: Broke left leg 3 years ago, 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, foot fused Neurologic Medical History: Positive for: Neuropathy Psychiatric Medical History: Past Medical History Notes: Depression Immunizations Pneumococcal Vaccine: Received Pneumococcal Vaccination: No Implantable Devices None Family and Social History Cancer: Yes - Father,Mother; Diabetes: Yes - Mother,Maternal Grandparents,Paternal Grandparents; Heart Disease: Yes - Siblings,Maternal Grandparents; Hypertension: Yes - Siblings; Tuberculosis: Yes - Maternal Grandparents,Paternal Grandparents; Current every day smoker; Marital Status - Single; Alcohol Use: Never; Drug Use: No History; Caffeine Use: Daily; Financial  Concerns: No; Food, Clothing or Shelter Needs: No; Support System Lacking: No; Transportation Concerns: No Electronic Signature(s) Signed: 12/22/2022 5:03:25 PM By: Kalman Shan DO Entered By: Kalman Shan on 12/22/2022 10:13:24 -------------------------------------------------------------------------------- Total Contact Cast Details Patient Name: Date of Service: Eleanora Neighbor MY [name]. 12/22/2022 8:00 A M Medical Record Number: [id number] Patient Account Number: [id number] Date of Birth/Sex: Treating RN: [birth year] (62 y.o. [name], [name] Primary Care Provider: Jenean Lindau Other Clinician: Referring Provider: Treating Provider/Extender: Elise Benne in [id number] Contact Cast Applied for Wound Assessment: otal Wound #2 Left,Distal Calcaneus Performed By: Physician Kalman Shan, DO Post Procedure Diagnosis  Same as Pre-procedure Electronic Signature(s) Signed: 12/22/2022 5:03:25 PM By: Kalman Shan DO Signed: 12/25/2022 11:12:19 AM By: Rhae Hammock RN Entered By: Rhae Hammock on 12/22/2022 08:36:15 -------------------------------------------------------------------------------- SuperBill Details Patient Name: Date of Service: [name] Lax [name]. 12/22/2022 Medical Record Number: [id number] Patient Account Number: [id number] Date of Birth/Sex: Treating RN: 1961/04/30 (62 y.o. [name], [name] Primary Care Provider: Jenean Lindau Other Clinician: Burney Gauze, Kila [name] ([id number]) [phone].pdf Page 10 of 10 Referring Provider: Treating Provider/Extender: Elise Benne in Treatment: 41 Diagnosis Coding ICD-10 Codes Code Description [phone] Non-pressure chronic ulcer of other part of left foot with fat layer exposed E11.621 Type 2 diabetes mellitus with foot ulcer E66.01 Morbid (severe) obesity due to excess calories Facility Procedures : CPT4 Code:  [id number] Description: [id number] - DEB SUBQ TISSUE 20 SQ CM/< ICD-10 Diagnosis Description L97.522 Non-pressure chronic ulcer of other part of left foot with fat layer exposed E11.621 Type 2 diabetes mellitus with foot ulcer Modifier: Quantity: 1 Physician Procedures : CPT4 Code Description Modifier [id number] - WC PHYS SUBQ TISS 20 SQ CM ICD-10 Diagnosis Description L97.522 Non-pressure chronic ulcer of other part of left foot with fat layer exposed E11.621 Type 2 diabetes mellitus with foot ulcer Quantity: 1 Electronic Signature(s) Signed: 12/22/2022 5:03:25 PM By: Kalman Shan DO Entered By: Kalman Shan on 12/22/2022 10:15:01

## 2022-12-29 ENCOUNTER — Encounter (HOSPITAL_BASED_OUTPATIENT_CLINIC_OR_DEPARTMENT_OTHER): Payer: 59 | Admitting: Internal Medicine

## 2022-12-29 DIAGNOSIS — L97522 Non-pressure chronic ulcer of other part of left foot with fat layer exposed: Secondary | ICD-10-CM

## 2022-12-29 DIAGNOSIS — E11621 Type 2 diabetes mellitus with foot ulcer: Secondary | ICD-10-CM | POA: Diagnosis not present

## 2022-12-29 NOTE — Progress Notes (Signed)
[name], [name] ([id number]) [id number] Page 1 of 11 Visit Report for 12/29/2022 Chief Complaint Document Details Patient Name: Date of Service: [name]. 12/29/2022 8:00 [name] Medical Record Number: [id number] Patient Account Number: [id number] Date of Birth/Sex: Treating RN: 1961-03-12 (62 y.o. F) Primary Care Provider: Jenean [name] Other Clinician: Referring Provider: Treating Provider/Extender: [name] in [id number] Information Obtained from: Patient Chief Complaint 03/05/2022; left foot wound Electronic Signature(s) Signed: 12/29/2022 10:32:58 AM By: [name] Shan DO Entered By: [name] on 12/29/2022 09:44:39 -------------------------------------------------------------------------------- Cellular or Tissue Based Product Details Patient Name: Date of Service: [name], [name]. 12/29/2022 8:00 [name] Medical Record Number: [id number] Patient Account Number: [id number] Date of Birth/Sex: Treating RN: June 07, 1961 (62 y.o. [name], [name] Primary Care Provider: Jenean [name] Other Clinician: Referring Provider: Treating Provider/Extender: [name] in Treatment: 42 Cellular or Tissue Based Product Type Wound #2 Left,Distal Calcaneus Applied to: Performed By: Physician [name] Shan, DO Cellular or Tissue Based Product Type: Epifix Level of Consciousness (Pre-procedure): Awake and Alert Pre-procedure Verification/Time Out Yes - 08:50 Taken: Location: genitalia / hands / feet / multiple digits Wound Size (sq cm): 10 Product Size (sq cm): 18 Waste Size (sq cm): 0 Amount of Product Applied (sq cm): 18 Instrument Used: Forceps, Scissors Lot #: [id number] Order #: 1 Expiration Date: 08/06/2027 Fenestrated: Yes Instrument: Mesher Reconstituted: Yes Solution Type: normal saline Solution Amount: 37mL Lot #: [id number] Solution Expiration Date:  03/04/2025 Secured: Yes Secured With: Steri-Strips, cutimed sorbact swab Dressing Applied: No Procedural Pain: 0 Post Procedural Pain: 0 Response to Treatment: Procedure was tolerated well Level of Consciousness (Post- Awake and Alert procedure): Post Procedure Diagnosis Same as Pre-procedure Electronic Signature(s) Signed: 12/29/2022 10:32:58 AM By: [name] Shan DO Signed: 12/29/2022 4:22:41 PM By: [name] Pilling RN, BSN [name], [name] ([id number]) [id number] Page 2 of 11 Entered By: [name] on 12/29/2022 08:53:16 -------------------------------------------------------------------------------- Debridement Details Patient Name: Date of Service: [name]. 12/29/2022 8:00 [name] Medical Record Number: [id number] Patient Account Number: [id number] Date of Birth/Sex: Treating RN: [birth year] (62 y.o. [name] Primary Care Provider: Jenean [name] Other Clinician: Referring Provider: Treating Provider/Extender: [name] in Treatment: 42 Debridement Performed for Assessment: Wound #2 Left,Distal Calcaneus Performed By: Physician [name] Shan, DO Debridement Type: Debridement Severity of Tissue Pre Debridement: Fat layer exposed Level of Consciousness (Pre-procedure): Awake and Alert Pre-procedure Verification/Time Out Yes - 08:35 Taken: Start Time: 08:36 Pain Control: Lidocaine 4% T opical Solution T Area Debrided (L x W): otal 2.5 (cm) x 4 (cm) = 10 (cm) Tissue and other material debrided: Viable, Non-Viable, Callus, Slough, Subcutaneous, Skin: Dermis , Skin: Epidermis, Biofilm, Slough Level: Skin/Subcutaneous Tissue Debridement Description: Excisional Instrument: Curette Bleeding: Minimum Hemostasis Achieved: Pressure End Time: 08:42 Procedural Pain: 0 Post Procedural Pain: 0 Response to Treatment: Procedure was tolerated well Level of Consciousness (Post- Awake and  Alert procedure): Post Debridement Measurements of Total Wound Length: (cm) 2.5 Width: (cm) 4.2 Depth: (cm) 0.2 Volume: (cm) 1.649 Character of Wound/Ulcer Post Debridement: Improved Severity of Tissue Post Debridement: Fat layer exposed Post Procedure Diagnosis Same as Pre-procedure Electronic Signature(s) Signed: 12/29/2022 10:32:58 AM By: [name] Shan DO Signed: 12/29/2022 4:22:41 PM By: [name] Pilling RN, BSN Entered By: [name] on 12/29/2022 08:43:51 -------------------------------------------------------------------------------- HPI Details Patient Name: Date of Service: [name], [name]. 12/29/2022 8:00 [name] Medical Record Number: [id number] Patient Account Number: [id number] Date of Birth/Sex: Treating RN:  [birth year] (62 y.o. F) Primary Care Provider: Jenean [name] Other Clinician: Referring Provider: Treating Provider/Extender: [name] in Treatment: 71 History of Present Illness HPI Description: Admission 03/05/2022 [name] is [name] 62 year old female with [name] past medical history of uncontrolled insulin-dependent type 2 diabetes, tobacco user and chronic diastolic heart failure that presents to the clinic for [name] 44-month history of wound to her left heel. She states she had [name] left ankle fusion in September 2022. She states that she always had [name] wound after the surgery and it never healed. She has home health and they have been doing compression wraps along with silver alginate to the wound bed. She currently denies signs of infection. 6/8; this is [name] 62 year old woman with type 2 diabetes. She developed [name] wound on her left Achilles heel just above the tip of the heel in the setting of recurrent ankle surgeries in late 2022. I have seen some of these results from either the cast or the surgical boots that are put on after these operations. She thinks this may be the case. And [name] sense of pressure ulcer. In any case that she has been  using Santyl Hydrofera Blue under compression. She is not wearing any Arcidiacono, Gracia [name] ([id number]) [phone].pdf Page 3 of 11 footwear at home as she cannot find anything to accommodate the wrap 6/22; patient presents for follow-up. She has home health that comes out once [name] week to help with dressing changes. She has no issues or complaints today. We have been using Hydrofera Blue under compression therapy. 7/6; patient presents for follow-up. She states that home health did not come out for the past 2 weeks. It is unclear why. We have been using Hydrofera Blue and Santyl under compression therapy. 7/13; patient presents for follow-up. She did not take the oral antibiotics prescribed at last clinic visit. We have been using Hydrofera Blue with gentamicin/mupirocin ointment under compression therapy. She has no issues or complaints today. She denies signs of infection. 7/20; patient presents for follow-up. We have been using Hydrofera Blue with antibiotic ointment under 3 layer compression. She has home health that change the dressing once. They put collagen on the wound bed. She also reports falling yesterday and hitting her right foot. She has no pain to the area today. 7/27; patient presents for follow-up. We have been using Hydrofera Blue under 3 layer compression. She has home health that changes the dressing. She has no issues or complaints today. 8/3; patient presents for follow-up. We continues to use Hydrofera Blue under 3 layer compression. She has no issues or complaints today. She has been approved for Epicord at 100%. 8/11; patient presents for follow-up. We have been using Hydrofera Blue under 3 layer compression. She has been approved for Epicord and we have this today. She is in agreement with having this applied. 8/18; patient presents for follow-up. Epicord #1 was placed in standard fashion last clinic visit. She has no issues or complaints today. 9/18;  Unfortunately patient has missed her last clinic appointments due to falling and breaking her right ankle. We have been following her for her left ankle wound. She has also developed [name] large blister to the left heel over the past week that has ruptured and dried. She currently resides In [facility]. Wound9/25; patient presents for follow-up. We have been using Hydrofera Blue and Santyl to the original left ankle wound and Xeroform to the dried blistered. We have been wrapping her with compression therapy. She  resides in [name] facility and they changed the wrap once this past week. She has no issues or complaints today. She denies signs of infection. 10/3; patient presents for follow-up. We have been using Xeroform to the heel wound and Epicort to the left ankle wound All under compression therapy. She has no issues or complaints today. 10/10; patient presents for follow-up. We have been using Epicort to the left ankle wound and Santyl and Hydrofera Blue to the heel wound. All under compression therapy. she has no issues or complaints today. 10/16; patient presents for follow-up. Epicort was placed in standard fashion to the superior wound and onto the heel and Santyl and Hydrofera Blue. She states that the wrap got wet during her shower and her facility was able to rewrap with Kerlix/Coban. 10/23; patient presents for follow-up. Epicord was placed to the wound beds last clinic visit. We continue Kerlix/Coban. She has no issues or complaints today. 10/31; Patient presents for follow-up. Epicord was placed to the wound beds last clinic visit. The original wound is almost healed. We have done this under Kerlix/Coban. She denies signs of infection. 11/10; patient presents for follow-up. Patient's last epi cord was placed in standard fashion at last clinic visit. The original wound has healed. She still has [name] heel wound. Grafix was approved however too costly for the patient. We will rerun epi cord to see  if she can have more applications. She had done very well with this. We have been using Hydrofera Blue to the heel under Kerlix/Coban. 11/21; patient presents for follow-up. We have been using Hydrofera Blue and Santyl to the heel under Kerlix/Coban. She switched to [name] new healthcare insurance and again The skin substitute is too costly for the patient. She denies signs of infection. 11/28; patient presents for follow-up. Her facility did not change the wrap last clinic visit due to lack of staffing. The wrap was taken off and Hydrofera Blue dressing has been in place for the past week. She has no issues or complaints today. 12/5; patient presents for follow-up. She states she has had more drainage over the past week. They did not change the wrap at her facility. She states the nurse will be back this week. She is also been doing [name] lot of physical therapy. She has been using the Prevalon boot while in bed. 12/12; patient presents for follow-up. We have been using Hydrofera Blue with antibiotic ointment under 4-layer compression. She is tolerated the wrap well. She states she is using her Prevalon boot while in the wheelchair. She has no issues or complaints today. There is been improvement in wound healing. 12/19; patient presents for follow-up. We have been using Hydrofera Blue and antibiotic ointment to the wound bed under 4-layer compression. Her facility change the wrap once. She states she has been using the Prevalon boot in bed but not in the wheelchair due to issues with transferring. Overall there is still improvement in wound healing. 1/16; patient presents for follow-up. She missed her last clinic appointment. We have been using Hydrofera Blue and antibiotic ointment under 4-layer compression. At the facility this has only been changed twice over the past 3 weeks. They are not using 4-layer compression. She came in with Kerlix/Coban. 1/23; patient presents for follow-up. We have been using  Santyl and Hydrofera Blue under 4-layer compression. Patient has no issues or complaints today. 2/1; patient presents for follow-up. Patient's been using Santyl and Hydrofera Blue to the wound bed. She has developed more slough and  now [name] mild odor. She states she is wearing the Prevalon boot at night. It is unclear if she is offloading this area during the day. 2/13; patient presents for follow-up. Patient's been using Dakin's wet-to-dry dressings. She received her Keystone antibiotic ointment in the mail. She has not used this yet. She reports using her Prevalon boot. She has no issues or complaints today. 2/20; patient presents for follow-up. She has been using Keystone antibiotic ointment with Hydrofera Blue. She has no issues or complaints today. Due to transportation she cannot do the total contact cast today. She is scheduled for next week to have this placed. 2/27; patient presents for follow-up. Plan is for the total contact cast today. We have been using Keystone and Hydrofera Blue to the wound bed. She forgot her Keystone antibiotic ointment. She has no issues or complaints today. 2/29; patient presents for follow-up. Patient presents for her obligatory cast change. She has no issues or complaints today. 3/5; patient presents for follow-up. She has had the cast in place for the past week and has tolerated this well. She has no issues or complaints today. We have been using PolyMem with Keystone antibiotic ointment. 12/15/2022: The wound is quite clean and measured smaller today. 3/19; we have been using Keystone antibiotic ointment with PolyMem silver under the total contact cast. Overall there is been improvement in wound healing. [name], [name] ([id number]) [id number].pdf Page 4 of 11 3/26; patient presents for follow-up. She is been approved for epi fix. She would like to proceed with this today. Previously we have been using Keystone antibiotic ointment with  PolyMem silver under the total contact cast. She has no issues or complaints. Electronic Signature(s) Signed: 12/29/2022 10:32:58 AM By: [name] Shan DO Entered By: [name] on 12/29/2022 09:45:27 -------------------------------------------------------------------------------- Physical Exam Details Patient Name: Date of Service: [name] Delane Ginger, [name]. 12/29/2022 8:00 [name] Medical Record Number: [id number] Patient Account Number: [id number] Date of Birth/Sex: Treating RN: [birth year] (62 y.o. F) Primary Care Provider: Jenean [name] Other Clinician: Referring Provider: Treating Provider/Extender: [name] in Treatment: 42 Constitutional respirations regular, non-labored and within target range for patient.. Cardiovascular 2+ dorsalis pedis/posterior tibialis pulses. Psychiatric pleasant and cooperative. Notes Open wound to the left heel with granulation tissue and nonviable tissue. No signs of surrounding infection. Electronic Signature(s) Signed: 12/29/2022 10:32:58 AM By: [name] Shan DO Entered By: [name] on 12/29/2022 09:45:48 -------------------------------------------------------------------------------- Physician Orders Details Patient Name: Date of Service: [name], [name]. 12/29/2022 8:00 [name] Medical Record Number: [id number] Patient Account Number: [id number] Date of Birth/Sex: Treating RN: 11/22/1960 (62 y.o. [name], [name] Primary Care Provider: Jenean [name] Other Clinician: Referring Provider: Treating Provider/Extender: [name] in Treatment: 30 Verbal / Phone Orders: No Diagnosis Coding ICD-10 Coding Code Description [phone] Non-pressure chronic ulcer of other part of left foot with fat layer exposed E11.621 Type 2 diabetes mellitus with foot ulcer E66.01 Morbid (severe) obesity due to excess calories Follow-up Appointments ppointment in 1 week. - Dr. Celine Ahr Covering  for Dr. Heber Boyden 01/05/2023 3pm (already has appt) Return [name] ppointment in 2 weeks. - Dr. Heber  01/12/2023 0900 room 7 Return [name] Other: - Leave dressing in place. Facility not to change dressing. Anesthetic (In clinic) Topical Lidocaine 5% applied to wound bed (In clinic) Topical Lidocaine 4% applied to wound bed Cellular or Tissue Based Products Cellular or Tissue Based Product Type: - Run IVR for EpiFix and Epicord=100% covered 05/07/22: Epicord ordered 12/08/2022: Run insurance  for epicord and theraskin. daptic or Mepitel. (DO NOT REMOVE). - Cellular or Tissue Based Product applied to wound bed, secured with steri-strips, cover with [name] Epicord #1 05/15/25 Epicord #2 05/22/22 Epicord #3 06/29/2022 [name], [name] ([id number]) [id number] Page 5 of 11 Epicord #4 07/07/2022 Epifix #5 07/14/22 Epicord #6 07/20/22 Epicord # 7 07/27/22 ***Run for Grafix***PENDING Epicord #8 08/04/2022 2024-IVR epifix approved 12/29/2022 #1 epifix applied to left heel. Bathing/ Shower/ Hygiene May shower with protection but do not get wound dressing(s) wet. Protect dressing(s) with water repellant cover (for example, large plastic bag) or [name] cast cover and may then take shower. Edema Control - Lymphedema / SCD / Other Elevate legs to the level of the heart or above for 30 minutes daily and/or when sitting for 3-4 times [name] day throughout the day. Avoid standing for long periods of time. Off-Loading Total Contact Cast to Left Lower Extremity - size 4 Additional Orders / Instructions Follow Nutritious Diet - Monitor/Control Blood Sugar Wound Treatment Wound #2 - Calcaneus Wound Laterality: Left, Distal Cleanser: Soap and Water 1 x Per Week/30 Days Discharge Instructions: May shower and wash wound with dial antibacterial soap and water prior to dressing change. Cleanser: Wound Cleanser 1 x Per Week/30 Days Discharge Instructions: Cleanse the wound with wound cleanser prior to applying [name]  clean dressing using gauze sponges, not tissue or cotton balls. Topical: Keystone 1 x Per Week/30 Days Discharge Instructions: applied by provider under epifix Prim Dressing: epifix 1 x Per Week/30 Days ary Discharge Instructions: applied by provider Prim Dressing: cutimed sorbact swab and steri-strips 1 x Per Week/30 Days ary Discharge Instructions: secured the epifix Secondary Dressing: ABD Pad, 8x10 1 x Per Week/30 Days Discharge Instructions: Apply over primary dressing as directed. Secondary Dressing: Woven Gauze Sponge, Non-Sterile 4x4 in (Home Health) 1 x Per Week/30 Days Discharge Instructions: Apply over primary dressing as directed. Secured With: [employer], 4.5x3.1 (in/yd) 1 x Per Week/30 Days Discharge Instructions: Secure with Kerlix as directed. Electronic Signature(s) Signed: 12/29/2022 10:32:58 AM By: [name] Shan DO Entered By: [name] on 12/29/2022 09:45:55 -------------------------------------------------------------------------------- Problem List Details Patient Name: Date of Service: [name], [name]. 12/29/2022 8:00 [name] Medical Record Number: [id number] Patient Account Number: [id number] Date of Birth/Sex: Treating RN: [birth year] (62 y.o. [name] Primary Care Provider: Jenean [name] Other Clinician: Referring Provider: Treating Provider/Extender: [name] in [id number] Active Problems ICD-10 Encounter Code Description Active Date MDM Diagnosis [phone] Non-pressure chronic ulcer of other part of left foot with fat layer exposed 03/05/2022 No Yes [name], [name] ([id number]) [id number] Page 6 of 11 E11.621 Type 2 diabetes mellitus with foot ulcer 03/05/2022 No Yes E66.01 Morbid (severe) obesity due to excess calories 03/05/2022 No Yes Inactive Problems Resolved Problems Electronic Signature(s) Signed: 12/29/2022 10:32:58 AM By: [name] Shan DO Entered By: [name] on 12/29/2022 09:44:20 -------------------------------------------------------------------------------- Progress Note Details Patient Name: Date of Service: [name]. 12/29/2022 8:00 [name] Medical Record Number: [id number] Patient Account Number: [id number] Date of Birth/Sex: Treating RN: [birth year] (62 y.o. F) Primary Care Provider: Jenean [name] Other Clinician: Referring Provider: Treating Provider/Extender: [name] in Treatment: 42 Subjective Chief Complaint Information obtained from Patient 03/05/2022; left foot wound History of Present Illness (HPI) Admission 03/05/2022 Ms. Orly Avinger is [name] 62 year old female with [name] past medical history of uncontrolled insulin-dependent type 2 diabetes, tobacco user and chronic diastolic heart failure that presents to the clinic for [name] 74-month  history of wound to her left heel. She states she had [name] left ankle fusion in September 2022. She states that she always had [name] wound after the surgery and it never healed. She has home health and they have been doing compression wraps along with silver alginate to the wound bed. She currently denies signs of infection. 6/8; this is [name] 62 year old woman with type 2 diabetes. She developed [name] wound on her left Achilles heel just above the tip of the heel in the setting of recurrent ankle surgeries in late 2022. I have seen some of these results from either the cast or the surgical boots that are put on after these operations. She thinks this may be the case. And [name] sense of pressure ulcer. In any case that she has been using Santyl Hydrofera Blue under compression. She is not wearing any footwear at home as she cannot find anything to accommodate the wrap 6/22; patient presents for follow-up. She has home health that comes out once [name] week to help with dressing changes. She has no issues or complaints today. We have been using Hydrofera Blue under compression therapy. 7/6;  patient presents for follow-up. She states that home health did not come out for the past 2 weeks. It is unclear why. We have been using Hydrofera Blue and Santyl under compression therapy. 7/13; patient presents for follow-up. She did not take the oral antibiotics prescribed at last clinic visit. We have been using Hydrofera Blue with gentamicin/mupirocin ointment under compression therapy. She has no issues or complaints today. She denies signs of infection. 7/20; patient presents for follow-up. We have been using Hydrofera Blue with antibiotic ointment under 3 layer compression. She has home health that change the dressing once. They put collagen on the wound bed. She also reports falling yesterday and hitting her right foot. She has no pain to the area today. 7/27; patient presents for follow-up. We have been using Hydrofera Blue under 3 layer compression. She has home health that changes the dressing. She has no issues or complaints today. 8/3; patient presents for follow-up. We continues to use Hydrofera Blue under 3 layer compression. She has no issues or complaints today. She has been approved for Epicord at 100%. 8/11; patient presents for follow-up. We have been using Hydrofera Blue under 3 layer compression. She has been approved for Epicord and we have this today. She is in agreement with having this applied. 8/18; patient presents for follow-up. Epicord #1 was placed in standard fashion last clinic visit. She has no issues or complaints today. 9/18; Unfortunately patient has missed her last clinic appointments due to falling and breaking her right ankle. We have been following her for her left ankle wound. She has also developed [name] large blister to the left heel over the past week that has ruptured and dried. She currently resides In [facility]. Wound9/25; patient presents for follow-up. We have been using Hydrofera Blue and Santyl to the original left ankle wound and Xeroform to the  dried blistered. We have been wrapping her with compression therapy. She resides in [name] facility and they changed the wrap once this past week. She has no issues or complaints today. She denies signs of infection. 10/3; patient presents for follow-up. We have been using Xeroform to the heel wound and Epicort to the left ankle wound All under compression therapy. She has no issues or complaints today. 10/10; patient presents for follow-up. We have been using Epicort to the left ankle wound and  Santyl and Hydrofera Blue to the heel wound. All under compression therapy. she has no issues or complaints today. [name], [name] ([id number]) [id number].pdf Page 7 of 11 10/16; patient presents for follow-up. Epicort was placed in standard fashion to the superior wound and onto the heel and Santyl and Hydrofera Blue. She states that the wrap got wet during her shower and her facility was able to rewrap with Kerlix/Coban. 10/23; patient presents for follow-up. Epicord was placed to the wound beds last clinic visit. We continue Kerlix/Coban. She has no issues or complaints today. 10/31; Patient presents for follow-up. Epicord was placed to the wound beds last clinic visit. The original wound is almost healed. We have done this under Kerlix/Coban. She denies signs of infection. 11/10; patient presents for follow-up. Patient's last epi cord was placed in standard fashion at last clinic visit. The original wound has healed. She still has [name] heel wound. Grafix was approved however too costly for the patient. We will rerun epi cord to see if she can have more applications. She had done very well with this. We have been using Hydrofera Blue to the heel under Kerlix/Coban. 11/21; patient presents for follow-up. We have been using Hydrofera Blue and Santyl to the heel under Kerlix/Coban. She switched to [name] new healthcare insurance and again The skin substitute is too costly for the patient. She  denies signs of infection. 11/28; patient presents for follow-up. Her facility did not change the wrap last clinic visit due to lack of staffing. The wrap was taken off and Hydrofera Blue dressing has been in place for the past week. She has no issues or complaints today. 12/5; patient presents for follow-up. She states she has had more drainage over the past week. They did not change the wrap at her facility. She states the nurse will be back this week. She is also been doing [name] lot of physical therapy. She has been using the Prevalon boot while in bed. 12/12; patient presents for follow-up. We have been using Hydrofera Blue with antibiotic ointment under 4-layer compression. She is tolerated the wrap well. She states she is using her Prevalon boot while in the wheelchair. She has no issues or complaints today. There is been improvement in wound healing. 12/19; patient presents for follow-up. We have been using Hydrofera Blue and antibiotic ointment to the wound bed under 4-layer compression. Her facility change the wrap once. She states she has been using the Prevalon boot in bed but not in the wheelchair due to issues with transferring. Overall there is still improvement in wound healing. 1/16; patient presents for follow-up. She missed her last clinic appointment. We have been using Hydrofera Blue and antibiotic ointment under 4-layer compression. At the facility this has only been changed twice over the past 3 weeks. They are not using 4-layer compression. She came in with Kerlix/Coban. 1/23; patient presents for follow-up. We have been using Santyl and Hydrofera Blue under 4-layer compression. Patient has no issues or complaints today. 2/1; patient presents for follow-up. Patient's been using Santyl and Hydrofera Blue to the wound bed. She has developed more slough and now [name] mild odor. She states she is wearing the Prevalon boot at night. It is unclear if she is offloading this area during the  day. 2/13; patient presents for follow-up. Patient's been using [employer]'s wet-to-dry dressings. She received her Keystone antibiotic ointment in the mail. She has not used this yet. She reports using her Prevalon boot. She has no issues or complaints  today. 2/20; patient presents for follow-up. She has been using Keystone antibiotic ointment with Hydrofera Blue. She has no issues or complaints today. Due to transportation she cannot do the total contact cast today. She is scheduled for next week to have this placed. 2/27; patient presents for follow-up. Plan is for the total contact cast today. We have been using Keystone and Hydrofera Blue to the wound bed. She forgot her Keystone antibiotic ointment. She has no issues or complaints today. 2/29; patient presents for follow-up. Patient presents for her obligatory cast change. She has no issues or complaints today. 3/5; patient presents for follow-up. She has had the cast in place for the past week and has tolerated this well. She has no issues or complaints today. We have been using PolyMem with Keystone antibiotic ointment. 12/15/2022: The wound is quite clean and measured smaller today. 3/19; we have been using Keystone antibiotic ointment with PolyMem silver under the total contact cast. Overall there is been improvement in wound healing. 3/26; patient presents for follow-up. She is been approved for epi fix. She would like to proceed with this today. Previously we have been using Keystone antibiotic ointment with PolyMem silver under the total contact cast. She has no issues or complaints. Patient History Information obtained from Patient, Chart. Family History Cancer - Father,Mother, Diabetes - Mother,Maternal Grandparents,Paternal Grandparents, Heart Disease - Siblings,Maternal Grandparents, Hypertension - Siblings, Tuberculosis - Maternal Grandparents,Paternal Grandparents. Social History Current every day smoker, Marital Status - Single,  Alcohol Use - Never, Drug Use - No History, Caffeine Use - Daily. 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 Vitals Time Taken: 8:29 AM, Height: 66 in, Weight: 339 lbs, BMI: 54.7, Temperature: 98.3 F, Pulse: 84 bpm, Respiratory Rate: 20 breaths/min, Blood Pressure: 136/78 mmHg, Capillary Blood Glucose: 168 mg/dl. Cardiovascular 2+ dorsalis pedis/posterior tibialis pulses. Psychiatric pleasant and cooperative. General Notes: Open wound to the left heel with granulation tissue and nonviable tissue. No signs of surrounding infection. Integumentary (Hair, Skin) Wound #2 status is Open. Original cause of wound was Blister. The date acquired was: 06/15/2022. The wound has been in treatment 26 weeks. The wound is located on the Left,Distal Calcaneus. The wound measures 2.5cm length x 4cm width x 0.2cm depth; 7.854cm^2 area and 1.571cm^3 volume. There is Fat Layer (Subcutaneous Tissue) exposed. There is [name] medium amount of serosanguineous drainage noted. The wound margin is distinct with the outline attached to the wound base. There is medium (34-66%) red, pink granulation within the wound bed. There is [name] medium (34-66%) amount of necrotic tissue within the wound bed. The periwound skin  appearance had no abnormalities noted for texture. The periwound skin appearance had no abnormalities noted for color. The periwound skin appearance did not exhibit: Dry/Scaly, Maceration. Periwound temperature was noted as No Abnormality. Assessment Active Problems ICD-10 Non-pressure chronic ulcer of other part of left foot with fat layer exposed Type 2 diabetes mellitus with foot ulcer Morbid (severe) obesity due to excess calories Patient's wound is stable.  I debrided nonviable tissue. She had healthy granulation tissue present for skin substitute placement. I also use Keystone antibiotic with this. Continue the total contact cast. Procedures Wound #2 Pre-procedure diagnosis of Wound #2 is [name] Diabetic Wound/Ulcer of the Lower Extremity located on the Left,Distal Calcaneus .Severity of Tissue Pre Debridement is: Fat layer exposed. There was [name] Excisional Skin/Subcutaneous Tissue Debridement with [name] total area of 10 sq cm performed by [name] Shan, DO. With the following instrument(s): Curette to remove Viable and Non-Viable tissue/material. Material removed includes Callus, Subcutaneous Tissue, Slough, Skin: Dermis, Skin: Epidermis, and Biofilm after achieving pain control using Lidocaine 4% Topical Solution. [name] time out was conducted at 08:35, prior to the start of the procedure. [name] Minimum amount of bleeding was controlled with Pressure. The procedure was tolerated well with [name] pain level of 0 throughout and [name] pain level of 0 following the procedure. Post Debridement Measurements: 2.5cm length x 4.2cm width x 0.2cm depth; 1.649cm^3 volume. Character of Wound/Ulcer Post Debridement is improved. Severity of Tissue Post Debridement is: Fat layer exposed. Post procedure Diagnosis Wound #2: Same as Pre-Procedure Pre-procedure diagnosis of Wound #2 is [name] Diabetic Wound/Ulcer of the Lower Extremity located on the Left,Distal Calcaneus. [name] skin graft procedure using [name] bioengineered skin  substitute/cellular or tissue based product was performed by [name] Shan, DO with the following instrument(s): Forceps and Scissors. Epifix was applied and secured with Steri-Strips and cutimed sorbact swab. 18 sq cm of product was utilized and 0 sq cm was wasted. Post Application, no dressing was applied. [name] Time Out was conducted at 08:50, prior to the start of the procedure. The procedure was tolerated well with [name] pain level of 0 throughout and [name] pain level of 0 following the procedure. Post procedure Diagnosis Wound #2: Same as Pre-Procedure . Pre-procedure diagnosis of Wound #2 is [name] Diabetic Wound/Ulcer of the Lower Extremity located on the Left,Distal Calcaneus . There was [name] T Programmer, multimedia otal Procedure by [name] Shan, DO. Post procedure Diagnosis Wound #2: Same as Pre-Procedure Notes: size 4. Plan Follow-up Appointments: Mote, Raegan [name] ([id number]) [id number] Page 9 of 11 Return Appointment in 1 week. - Dr. Celine Ahr Covering for Dr. Heber [place] 01/05/2023 3pm (already has appt) Return Appointment in 2 weeks. - Dr. Heber  01/12/2023 0900 room 7 Other: - Leave dressing in place. Facility not to change dressing. Anesthetic: (In clinic) Topical Lidocaine 5% applied to wound bed (In clinic) Topical Lidocaine 4% applied to wound bed Cellular or Tissue Based Products: Cellular or Tissue Based Product Type: - Run IVR for EpiFix and Epicord=100% covered 05/07/22: Epicord ordered 12/08/2022: [employer] insurance for epicord and theraskin. Cellular or Tissue Based Product applied to wound bed, secured with steri-strips, cover with Adaptic or Mepitel. (DO NOT REMOVE). - Epicord #1 05/15/25 Epicord #2 05/22/22 Epicord #3 06/29/2022 Epicord #4 07/07/2022 Epifix #5 07/14/22 Epicord #6 07/20/22 Epicord # 7 07/27/22 ***Run for Grafix***PENDING Epicord #8 08/04/2022 2024-IVR epifix approved 12/29/2022 #1 epifix applied to left heel. Bathing/ Shower/ Hygiene: May shower with protection  but do not get wound dressing(s) wet. Protect dressing(s) with water repellant cover (for example, large plastic bag) or [name] cast cover and may then take shower. Edema Control - Lymphedema / SCD / Other: Elevate legs to the level of the heart or above for 30 minutes daily and/or when sitting for 3-4 times [name] day throughout the day. Avoid standing for long periods of time. Off-Loading: T Contact Cast to Left Lower Extremity - size 4 otal Additional Orders /  Instructions: Follow Nutritious Diet - Monitor/Control Blood Sugar WOUND #2: - Calcaneus Wound Laterality: Left, Distal Cleanser: Soap and Water 1 x Per Week/30 Days Discharge Instructions: May shower and wash wound with dial antibacterial soap and water prior to dressing change. Cleanser: Wound Cleanser 1 x Per Week/30 Days Discharge Instructions: Cleanse the wound with wound cleanser prior to applying [name] clean dressing using gauze sponges, not tissue or cotton balls. Topical: Keystone 1 x Per Week/30 Days Discharge Instructions: applied by provider under epifix Prim Dressing: epifix 1 x Per Week/30 Days ary Discharge Instructions: applied by provider Prim Dressing: cutimed sorbact swab and steri-strips 1 x Per Week/30 Days ary Discharge Instructions: secured the epifix Secondary Dressing: ABD Pad, 8x10 1 x Per Week/30 Days Discharge Instructions: Apply over primary dressing as directed. Secondary Dressing: Woven Gauze Sponge, Non-Sterile 4x4 in (Home Health) 1 x Per Week/30 Days Discharge Instructions: Apply over primary dressing as directed. Secured With: [employer], 4.5x3.1 (in/yd) 1 x Per Week/30 Days Discharge Instructions: Secure with Kerlix as directed. 1. In office sharp debridement 2. EpiFix placed in standard fashion 3. Keystone antibiotic ointment 4. T contact cast placed in standard fashion otal 5. Follow-up in 1 week Electronic Signature(s) Signed: 12/29/2022 10:32:58 AM By: [name] Shan DO Entered By:  [name] on 12/29/2022 09:47:26 -------------------------------------------------------------------------------- HxROS Details Patient Name: Date of Service: [name], [name]. 12/29/2022 8:00 [name] Medical Record Number: [id number] Patient Account Number: [id number] Date of Birth/Sex: Treating RN: Aug 26, 1961 (63 y.o. F) Primary Care Provider: Jenean [name] Other Clinician: Referring Provider: Treating Provider/Extender: [name] in Treatment: 42 Information Obtained From Patient Chart Hematologic/Lymphatic Medical History: Positive for: Anemia Respiratory Medical History: Positive for: Chronic Obstructive Pulmonary Disease (COPD) Cardiovascular Medical History: Positive for: Congestive Heart Failure - Weighs self daily; Hypertension; Myocardial Infarction; Peripheral Venous Disease Ha, Eyvonne [name] ([id number]) [id number] Page 10 of 11 Gastrointestinal Medical History: Past Medical History Notes: GERD, Peptic Ulcer Disease Endocrine Medical History: Positive for: Type II Diabetes Time with diabetes: 16 years Treated with: Insulin, Oral agents Blood sugar tested every day: Yes Tested : Twice daily Musculoskeletal Medical History: Past Medical History Notes: Broke left leg 3 years ago, 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, foot fused Neurologic Medical History: Positive for: Neuropathy Psychiatric Medical History: Past Medical History Notes: Depression Immunizations Pneumococcal Vaccine: Received Pneumococcal Vaccination: No Implantable Devices None Family and Social History Cancer: Yes - Father,Mother; Diabetes: Yes - Mother,Maternal Grandparents,Paternal Grandparents; Heart Disease: Yes - Siblings,Maternal Grandparents; Hypertension: Yes - Siblings; Tuberculosis: Yes - Maternal Grandparents,Paternal Grandparents; Current every day smoker; Marital Status - Single; Alcohol Use: Never; Drug Use: No History;  Caffeine Use: Daily; Financial Concerns: No; Food, Clothing or Shelter Needs: No; Support System Lacking: No; Transportation Concerns: No Electronic Signature(s) Signed: 12/29/2022 10:32:58 AM By: [name] Shan DO Entered By: [name] on 12/29/2022 09:45:32 -------------------------------------------------------------------------------- Total Contact Cast Details Patient Name: Date of Service: Eleanora Neighbor MY [name]. 12/29/2022 8:00 [name] Medical Record Number: [id number] Patient Account Number: [id number] Date of Birth/Sex: Treating RN: Jun 17, 1961 (62 y.o. [name] Primary Care Provider: Jenean [name] Other Clinician: Referring Provider: Treating Provider/Extender: [name] in [id number] T Contact Cast Applied for Wound Assessment: otal Wound #2 Left,Distal Calcaneus Performed By: Physician [name] Shan, DO Post Procedure Diagnosis Same as Pre-procedure Notes size 4 Electronic Signature(s) Signed: 12/29/2022 10:32:58 AM By: [name] Shan DO Signed: 12/29/2022 4:22:41 PM By: [name] Pilling RN, BSN Entered By: [name] on 12/29/2022 08:44:17 Parada,  Adisyn [name] ([id number]) [phone].pdf Page 11 of 11 -------------------------------------------------------------------------------- SuperBill Details Patient Name: Date of Service: [name] 12/29/2022 Medical Record Number: [id number] Patient Account Number: [id number] Date of Birth/Sex: Treating RN: Apr 21, 1961 (62 y.o. [name] Primary Care Provider: Jenean [name] Other Clinician: Referring Provider: Treating Provider/Extender: [name] in Treatment: 42 Diagnosis Coding ICD-10 Codes Code Description [phone] Non-pressure chronic ulcer of other part of left foot with fat layer exposed E11.621 Type 2 diabetes mellitus with foot ulcer E66.01 Morbid (severe) obesity due to excess calories Facility  Procedures : CPT4 Code: [id number] Description: [id number] Epifix 4cm x 4.5cm Modifier: Quantity: 18 : CPT4 Code: [id number] Description: [id number] - SKIN SUB GRAFT FACE/NK/HF/G ICD-10 Diagnosis Description L97.522 Non-pressure chronic ulcer of other part of left foot with fat layer exposed E11.621 Type 2 diabetes mellitus with foot ulcer Modifier: Quantity: 1 Physician Procedures : CPT4 Code Description Modifier [id number] - WC PHYS SKIN SUB GRAFT FACE/NK/HF/G ICD-10 Diagnosis Description L97.522 Non-pressure chronic ulcer of other part of left foot with fat layer exposed E11.621 Type 2 diabetes mellitus with foot ulcer Quantity: 1 Electronic Signature(s) Signed: 12/29/2022 10:32:58 AM By: [name] Shan DO Entered By: [name] on 12/29/2022 09:47:35

## 2023-01-05 ENCOUNTER — Encounter (HOSPITAL_BASED_OUTPATIENT_CLINIC_OR_DEPARTMENT_OTHER): Payer: 59 | Attending: Internal Medicine | Admitting: General Surgery

## 2023-01-05 DIAGNOSIS — I5032 Chronic diastolic (congestive) heart failure: Secondary | ICD-10-CM | POA: Insufficient documentation

## 2023-01-05 DIAGNOSIS — L97522 Non-pressure chronic ulcer of other part of left foot with fat layer exposed: Secondary | ICD-10-CM | POA: Insufficient documentation

## 2023-01-05 DIAGNOSIS — I110 Hypertensive heart disease with heart failure: Secondary | ICD-10-CM | POA: Insufficient documentation

## 2023-01-05 DIAGNOSIS — Z6841 Body Mass Index (BMI) 40.0 and over, adult: Secondary | ICD-10-CM | POA: Diagnosis not present

## 2023-01-05 DIAGNOSIS — E11621 Type 2 diabetes mellitus with foot ulcer: Secondary | ICD-10-CM | POA: Diagnosis present

## 2023-01-05 DIAGNOSIS — F17200 Nicotine dependence, unspecified, uncomplicated: Secondary | ICD-10-CM | POA: Diagnosis not present

## 2023-01-05 DIAGNOSIS — Z794 Long term (current) use of insulin: Secondary | ICD-10-CM | POA: Diagnosis not present

## 2023-01-06 NOTE — Progress Notes (Signed)
Demaria, Robertha C ([id number]) [id number] Page 1 of 10 Visit Report for 01/05/2023 Chief Complaint Document Details Patient Name: Date of Service: [name] 01/05/2023 3:00 PM Medical Record Number: [id number] Patient Account Number: [id number] Date of Birth/Sex: Treating RN: 1961/08/07 (62 y.o. F) Primary Care Provider: Jenean Lindau Other Clinician: Referring Provider: Treating Provider/Extender: Neita Garnet in [id number] Information Obtained from: Patient Chief Complaint 03/05/2022; left foot wound Electronic Signature(s) Signed: 01/05/2023 3:30:26 PM By: Fredirick Maudlin MD FACS Entered By: Fredirick Maudlin on 01/05/2023 15:30:25 -------------------------------------------------------------------------------- Cellular or Tissue Based Product Details Patient Name: Date of Service: [place] C. 01/05/2023 3:00 PM Medical Record Number: [id number] Patient Account Number: [id number] Date of Birth/Sex: Treating RN: 1961-05-14 (62 y.o. [name], [name] Primary Care Provider: Jenean Lindau Other Clinician: Referring Provider: Treating Provider/Extender: Neita Garnet in [id number] Cellular or Tissue Based Product Type Wound #2 Left,Distal Calcaneus Applied to: Performed By: Physician Fredirick Maudlin, MD Cellular or Tissue Based Product Type: Epifix Level of Consciousness (Pre-procedure): Awake and Alert Pre-procedure Verification/Time Out Yes - 15:16 Taken: Location: genitalia / hands / feet / multiple digits Wound Size (sq cm): 9.12 Product Size (sq cm): 11 Waste Size (sq cm): 0 Amount of Product Applied (sq cm): 11 Instrument Used: Forceps, Scissors Lot #: [id number] Order #: 2 Expiration Date: 08/06/2027 Fenestrated: No Reconstituted: Yes Solution Type: normal saline Solution Amount: 3 mL Lot #: [id number] Solution Expiration Date: 03/04/2025 Secured: Yes Secured  With: Steri-Strips, adaptic Dressing Applied: No Procedural Pain: 0 Post Procedural Pain: 0 Response to Treatment: Procedure was tolerated well Level of Consciousness (Post- Awake and Alert procedure): Post Procedure Diagnosis Same as Pre-procedure Electronic Signature(s) Signed: 01/05/2023 3:42:20 PM By: Fredirick Maudlin MD FACS Signed: 01/05/2023 4:09:50 PM By: Deon Pilling RN, BSN Entered By: Deon Pilling on 01/05/2023 15:19:24 Cozort, Oluwatomisin C ([id number]) [id number] Page 2 of 10 -------------------------------------------------------------------------------- HPI Details Patient Name: Date of Service: [name] 01/05/2023 3:00 PM Medical Record Number: [id number] Patient Account Number: [id number] Date of Birth/Sex: Treating RN: [birth year] (62 y.o. F) Primary Care Provider: Jenean Lindau Other Clinician: Referring Provider: Treating Provider/Extender: Neita Garnet in Treatment: 66 History of Present Illness HPI Description: Admission 03/05/2022 Ms. Linzey Thakker is a 62 year old female with a past medical history of uncontrolled insulin-dependent type 2 diabetes, tobacco user and chronic diastolic heart failure that presents to the clinic for a 93-month history of wound to her left heel. She states she had a left ankle fusion in September 2022. She states that she always had a wound after the surgery and it never healed. She has home health and they have been doing compression wraps along with silver alginate to the wound bed. She currently denies signs of infection. 6/8; this is a 62 year old woman with type 2 diabetes. She developed a wound on her left Achilles heel just above the tip of the heel in the setting of recurrent ankle surgeries in late 2022. I have seen some of these results from either the cast or the surgical boots that are put on after these operations. She thinks this may be the case. And a sense of  pressure ulcer. In any case that she has been using Santyl Hydrofera Blue under compression. She is not wearing any footwear at home as she cannot find anything to accommodate the wrap 6/22; patient presents for follow-up. She has home health that comes out once a week to help with dressing  changes. She has no issues or complaints today. We have been using Hydrofera Blue under compression therapy. 7/6; patient presents for follow-up. She states that home health did not come out for the past 2 weeks. It is unclear why. We have been using Hydrofera Blue and Santyl under compression therapy. 7/13; patient presents for follow-up. She did not take the oral antibiotics prescribed at last clinic visit. We have been using Hydrofera Blue with gentamicin/mupirocin ointment under compression therapy. She has no issues or complaints today. She denies signs of infection. 7/20; patient presents for follow-up. We have been using Hydrofera Blue with antibiotic ointment under 3 layer compression. She has home health that change the dressing once. They put collagen on the wound bed. She also reports falling yesterday and hitting her right foot. She has no pain to the area today. 7/27; patient presents for follow-up. We have been using Hydrofera Blue under 3 layer compression. She has home health that changes the dressing. She has no issues or complaints today. 8/3; patient presents for follow-up. We continues to use Hydrofera Blue under 3 layer compression. She has no issues or complaints today. She has been approved for Epicord at 100%. 8/11; patient presents for follow-up. We have been using Hydrofera Blue under 3 layer compression. She has been approved for Epicord and we have this today. She is in agreement with having this applied. 8/18; patient presents for follow-up. Epicord #1 was placed in standard fashion last clinic visit. She has no issues or complaints today. 9/18; Unfortunately patient has missed her  last clinic appointments due to falling and breaking her right ankle. We have been following her for her left ankle wound. She has also developed a large blister to the left heel over the past week that has ruptured and dried. She currently resides In [facility]. Wound9/25; patient presents for follow-up. We have been using Hydrofera Blue and Santyl to the original left ankle wound and Xeroform to the dried blistered. We have been wrapping her with compression therapy. She resides in a facility and they changed the wrap once this past week. She has no issues or complaints today. She denies signs of infection. 10/3; patient presents for follow-up. We have been using Xeroform to the heel wound and Epicort to the left ankle wound All under compression therapy. She has no issues or complaints today. 10/10; patient presents for follow-up. We have been using Epicort to the left ankle wound and Santyl and Hydrofera Blue to the heel wound. All under compression therapy. she has no issues or complaints today. 10/16; patient presents for follow-up. Epicort was placed in standard fashion to the superior wound and onto the heel and Santyl and Hydrofera Blue. She states that the wrap got wet during her shower and her facility was able to rewrap with Kerlix/Coban. 10/23; patient presents for follow-up. Epicord was placed to the wound beds last clinic visit. We continue Kerlix/Coban. She has no issues or complaints today. 10/31; Patient presents for follow-up. Epicord was placed to the wound beds last clinic visit. The original wound is almost healed. We have done this under Kerlix/Coban. She denies signs of infection. 11/10; patient presents for follow-up. Patient's last epi cord was placed in standard fashion at last clinic visit. The original wound has healed. She still has a heel wound. Grafix was approved however too costly for the patient. We will rerun epi cord to see if she can have more applications.  She had done very well with this. We  have been using Hydrofera Blue to the heel under Kerlix/Coban. 11/21; patient presents for follow-up. We have been using Hydrofera Blue and Santyl to the heel under Kerlix/Coban. She switched to a new healthcare insurance and again The skin substitute is too costly for the patient. She denies signs of infection. 11/28; patient presents for follow-up. Her facility did not change the wrap last clinic visit due to lack of staffing. The wrap was taken off and Hydrofera Blue dressing has been in place for the past week. She has no issues or complaints today. 12/5; patient presents for follow-up. She states she has had more drainage over the past week. They did not change the wrap at her facility. She states the nurse will be back this week. She is also been doing a lot of physical therapy. She has been using the Prevalon boot while in bed. 12/12; patient presents for follow-up. We have been using Hydrofera Blue with antibiotic ointment under 4-layer compression. She is tolerated the wrap well. She states she is using her Prevalon boot while in the wheelchair. She has no issues or complaints today. There is been improvement in wound healing. 12/19; patient presents for follow-up. We have been using Hydrofera Blue and antibiotic ointment to the wound bed under 4-layer compression. Her facility change the wrap once. She states she has been using the Prevalon boot in bed but not in the wheelchair due to issues with transferring. Overall there is still Cavallaro, Tianni C ([id number]) [id number].pdf Page 3 of 10 improvement in wound healing. 1/16; patient presents for follow-up. She missed her last clinic appointment. We have been using Hydrofera Blue and antibiotic ointment under 4-layer compression. At the facility this has only been changed twice over the past 3 weeks. They are not using 4-layer compression. She came in with Kerlix/Coban. 1/23; patient  presents for follow-up. We have been using Santyl and Hydrofera Blue under 4-layer compression. Patient has no issues or complaints today. 2/1; patient presents for follow-up. Patient's been using Santyl and Hydrofera Blue to the wound bed. She has developed more slough and now a mild odor. She states she is wearing the Prevalon boot at night. It is unclear if she is offloading this area during the day. 2/13; patient presents for follow-up. Patient's been using Dakin's wet-to-dry dressings. She received her Keystone antibiotic ointment in the mail. She has not used this yet. She reports using her Prevalon boot. She has no issues or complaints today. 2/20; patient presents for follow-up. She has been using Keystone antibiotic ointment with Hydrofera Blue. She has no issues or complaints today. Due to transportation she cannot do the total contact cast today. She is scheduled for next week to have this placed. 2/27; patient presents for follow-up. Plan is for the total contact cast today. We have been using Keystone and Hydrofera Blue to the wound bed. She forgot her Keystone antibiotic ointment. She has no issues or complaints today. 2/29; patient presents for follow-up. Patient presents for her obligatory cast change. She has no issues or complaints today. 3/5; patient presents for follow-up. She has had the cast in place for the past week and has tolerated this well. She has no issues or complaints today. We have been using PolyMem with Keystone antibiotic ointment. 12/15/2022: The wound is quite clean and measured smaller today. 3/19; we have been using Keystone antibiotic ointment with PolyMem silver under the total contact cast. Overall there is been improvement in wound healing. 3/26; patient presents for follow-up. She is  been approved for epi fix. She would like to proceed with this today. Previously we have been using Keystone antibiotic ointment with PolyMem silver under the total contact  cast. She has no issues or complaints. 01/05/2023: The wound is clean and she is tolerating total contact casting without difficulty. She had her first application of EpiFix last week. Electronic Signature(s) Signed: 01/05/2023 3:31:12 PM By: Fredirick Maudlin MD FACS Entered By: Fredirick Maudlin on 01/05/2023 15:31:12 -------------------------------------------------------------------------------- Physical Exam Details Patient Name: Date of Service: HUFFMA Delane Ginger, [name]. 01/05/2023 3:00 PM Medical Record Number: [id number] Patient Account Number: [id number] Date of Birth/Sex: Treating RN: [birth year] (62 y.o. F) Primary Care Provider: Jenean Lindau Other Clinician: Referring Provider: Treating Provider/Extender: Neita Garnet in Treatment: 43 Constitutional Hypertensive, asymptomatic. . . . no acute distress. Respiratory Normal work of breathing on room air. Notes 01/05/2023: The wound appears to be contracting nicely with a good bed of granulation tissue. No concern for infection. There is some periwound tissue maceration. Electronic Signature(s) Signed: 01/05/2023 3:37:36 PM By: Fredirick Maudlin MD FACS Entered By: Fredirick Maudlin on 01/05/2023 15:37:35 -------------------------------------------------------------------------------- Physician Orders Details Patient Name: Date of Service: Clinton C. 01/05/2023 3:00 PM Medical Record Number: [id number] Patient Account Number: [id number] Date of Birth/Sex: Treating RN: [birth year] (62 y.o. [name] Primary Care Provider: Jenean Lindau Other Clinician: Referring Provider: Treating Provider/Extender: Neita Garnet in Treatment: 46 Verbal / Phone Orders: No Diagnosis Coding Alsobrook, Destinee C ([id number]) [phone].pdf Page 4 of 10 ICD-10 Coding Code Description [phone] Non-pressure chronic ulcer of other part of left foot with fat layer  exposed E11.621 Type 2 diabetes mellitus with foot ulcer E66.01 Morbid (severe) obesity due to excess calories Follow-up Appointments ppointment in 1 week. - Dr. Heber Gene Autry 01/12/2023 0900 room 7 Return A ppointment in 2 weeks. - 01/19/2023 215pm Dr.Hoffman Return A Other: - Leave dressing in place. Facility not to change dressing. Anesthetic (In clinic) Topical Lidocaine 5% applied to wound bed (In clinic) Topical Lidocaine 4% applied to wound bed Cellular or Tissue Based Products Cellular or Tissue Based Product Type: - Run IVR for EpiFix and Epicord=100% covered 05/07/22: Epicord ordered 12/08/2022: [employer] insurance for epicord and theraskin. daptic or Mepitel. (DO NOT REMOVE). - Cellular or Tissue Based Product applied to wound bed, secured with steri-strips, cover with A Epicord #1 05/15/25 Epicord #2 05/22/22 Epicord #3 06/29/2022 Epicord #4 07/07/2022 Epifix #5 07/14/22 Epicord #6 07/20/22 Epicord # 7 07/27/22 ***Run for Grafix***PENDING Epicord #8 08/04/2022 2024-IVR epifix approved 12/29/2022 #1 epifix applied to left heel. 01/05/2023 #2 epifix applied to left heel. Bathing/ Shower/ Hygiene May shower with protection but do not get wound dressing(s) wet. Protect dressing(s) with water repellant cover (for example, large plastic bag) or a cast cover and may then take shower. Edema Control - Lymphedema / SCD / Other Elevate legs to the level of the heart or above for 30 minutes daily and/or when sitting for 3-4 times a day throughout the day. Avoid standing for long periods of time. Off-Loading Total Contact Cast to Left Lower Extremity - size 4 Additional Orders / Instructions Follow Nutritious Diet - Monitor/Control Blood Sugar Wound Treatment Wound #2 - Calcaneus Wound Laterality: Left, Distal Cleanser: Soap and Water 1 x Per Week/30 Days Discharge Instructions: May shower and wash wound with dial antibacterial soap and water prior to dressing change. Cleanser: Wound Cleanser 1 x  Per Week/30 Days Discharge Instructions: Cleanse the wound with wound cleanser prior to applying  a clean dressing using gauze sponges, not tissue or cotton balls. Prim Dressing: epifix #2 1 x Per Week/30 Days ary Discharge Instructions: applied by provider Prim Dressing: adaptic and steri-strips 1 x Per Week/30 Days ary Discharge Instructions: secured the epifix Secondary Dressing: ABD Pad, 8x10 1 x Per Week/30 Days Discharge Instructions: Apply over primary dressing as directed. Secondary Dressing: Woven Gauze Sponge, Non-Sterile 4x4 in (Home Health) 1 x Per Week/30 Days Discharge Instructions: Apply over primary dressing as directed. Secured With: [employer], 4.5x3.1 (in/yd) 1 x Per Week/30 Days Discharge Instructions: Secure with Kerlix as directed. Electronic Signature(s) Signed: 01/05/2023 3:42:20 PM By: Fredirick Maudlin MD FACS Entered By: Fredirick Maudlin on 01/05/2023 15:38:06 Russom, Kendi C ([id number] Page 5 of 10 -------------------------------------------------------------------------------- Problem List Details Patient Name: Date of Service: [name] 01/05/2023 3:00 PM Medical Record Number: [id number] Patient Account Number: [id number] Date of Birth/Sex: Treating RN: Nov 16, 1960 (62 y.o. [name], [name] Primary Care Provider: Jenean Lindau Other Clinician: Referring Provider: Treating Provider/Extender: Neita Garnet in [id number] Active Problems ICD-10 Encounter Code Description Active Date MDM Diagnosis [phone] Non-pressure chronic ulcer of other part of left foot with fat layer exposed 03/05/2022 No Yes E11.621 Type 2 diabetes mellitus with foot ulcer 03/05/2022 No Yes E66.01 Morbid (severe) obesity due to excess calories 03/05/2022 No Yes Inactive Problems Resolved Problems Electronic Signature(s) Signed: 01/05/2023 3:29:58 PM By: Fredirick Maudlin MD FACS Entered By: Fredirick Maudlin on 01/05/2023 15:29:58 -------------------------------------------------------------------------------- Progress Note Details Patient Name: Date of Service: [name], [name]. 01/05/2023 3:00 PM Medical Record Number: [id number] Patient Account Number: [id number] Date of Birth/Sex: Treating RN: 03-01-1961 (62 y.o. F) Primary Care Provider: Jenean Lindau Other Clinician: Referring Provider: Treating Provider/Extender: Neita Garnet in [id number] Subjective Chief Complaint Information obtained from Patient 03/05/2022; left foot wound History of Present Illness (HPI) Admission 03/05/2022 Ms. Nayana Lamphier is a 62 year old female with a past medical history of uncontrolled insulin-dependent type 2 diabetes, tobacco user and chronic diastolic heart failure that presents to the clinic for a 2-month history of wound to her left heel. She states she had a left ankle fusion in September 2022. She states that she always had a wound after the surgery and it never healed. She has home health and they have been doing compression wraps along with silver alginate to the wound bed. She currently denies signs of infection. 6/8; this is a 62 year old woman with type 2 diabetes. She developed a wound on her left Achilles heel just above the tip of the heel in the setting of recurrent ankle surgeries in late 2022. I have seen some of these results from either the cast or the surgical boots that are put on after these operations. She thinks this may be the case. And a sense of pressure ulcer. In any case that she has been using Santyl Hydrofera Blue under compression. She is not wearing any footwear at home as she cannot find anything to accommodate the wrap 6/22; patient presents for follow-up. She has home health that comes out once a week to help with dressing changes. She has no issues or complaints today. We have been using Hydrofera Blue under compression therapy. 7/6;  patient presents for follow-up. She states that home health did not come out for the past 2 weeks. It is unclear why. We have been using Hydrofera Blue and Santyl under compression therapy. 7/13; patient presents for follow-up. She did not take the  oral antibiotics prescribed at last clinic visit. We have been using Hydrofera Blue with gentamicin/mupirocin ointment under compression therapy. She has no issues or complaints today. She denies signs of infection. 7/20; patient presents for follow-up. We have been using Hydrofera Blue with antibiotic ointment under 3 layer compression. She has home health that change Delaurentis, Talyssa C ([id number]) [phone].pdf Page 6 of 10 the dressing once. They put collagen on the wound bed. She also reports falling yesterday and hitting her right foot. She has no pain to the area today. 7/27; patient presents for follow-up. We have been using Hydrofera Blue under 3 layer compression. She has home health that changes the dressing. She has no issues or complaints today. 8/3; patient presents for follow-up. We continues to use Hydrofera Blue under 3 layer compression. She has no issues or complaints today. She has been approved for Epicord at 100%. 8/11; patient presents for follow-up. We have been using Hydrofera Blue under 3 layer compression. She has been approved for Epicord and we have this today. She is in agreement with having this applied. 8/18; patient presents for follow-up. Epicord #1 was placed in standard fashion last clinic visit. She has no issues or complaints today. 9/18; Unfortunately patient has missed her last clinic appointments due to falling and breaking her right ankle. We have been following her for her left ankle wound. She has also developed a large blister to the left heel over the past week that has ruptured and dried. She currently resides In [facility]. Wound9/25; patient presents for follow-up. We have been using  Hydrofera Blue and Santyl to the original left ankle wound and Xeroform to the dried blistered. We have been wrapping her with compression therapy. She resides in a facility and they changed the wrap once this past week. She has no issues or complaints today. She denies signs of infection. 10/3; patient presents for follow-up. We have been using Xeroform to the heel wound and Epicort to the left ankle wound All under compression therapy. She has no issues or complaints today. 10/10; patient presents for follow-up. We have been using Epicort to the left ankle wound and Santyl and Hydrofera Blue to the heel wound. All under compression therapy. she has no issues or complaints today. 10/16; patient presents for follow-up. Epicort was placed in standard fashion to the superior wound and onto the heel and Santyl and Hydrofera Blue. She states that the wrap got wet during her shower and her facility was able to rewrap with Kerlix/Coban. 10/23; patient presents for follow-up. Epicord was placed to the wound beds last clinic visit. We continue Kerlix/Coban. She has no issues or complaints today. 10/31; Patient presents for follow-up. Epicord was placed to the wound beds last clinic visit. The original wound is almost healed. We have done this under Kerlix/Coban. She denies signs of infection. 11/10; patient presents for follow-up. Patient's last epi cord was placed in standard fashion at last clinic visit. The original wound has healed. She still has a heel wound. Grafix was approved however too costly for the patient. We will rerun epi cord to see if she can have more applications. She had done very well with this. We have been using Hydrofera Blue to the heel under Kerlix/Coban. 11/21; patient presents for follow-up. We have been using Hydrofera Blue and Santyl to the heel under Kerlix/Coban. She switched to a new healthcare insurance and again The skin substitute is too costly for the patient. She denies  signs of infection. 11/28;  patient presents for follow-up. Her facility did not change the wrap last clinic visit due to lack of staffing. The wrap was taken off and Hydrofera Blue dressing has been in place for the past week. She has no issues or complaints today. 12/5; patient presents for follow-up. She states she has had more drainage over the past week. They did not change the wrap at her facility. She states the nurse will be back this week. She is also been doing a lot of physical therapy. She has been using the Prevalon boot while in bed. 12/12; patient presents for follow-up. We have been using Hydrofera Blue with antibiotic ointment under 4-layer compression. She is tolerated the wrap well. She states she is using her Prevalon boot while in the wheelchair. She has no issues or complaints today. There is been improvement in wound healing. 12/19; patient presents for follow-up. We have been using Hydrofera Blue and antibiotic ointment to the wound bed under 4-layer compression. Her facility change the wrap once. She states she has been using the Prevalon boot in bed but not in the wheelchair due to issues with transferring. Overall there is still improvement in wound healing. 1/16; patient presents for follow-up. She missed her last clinic appointment. We have been using Hydrofera Blue and antibiotic ointment under 4-layer compression. At the facility this has only been changed twice over the past 3 weeks. They are not using 4-layer compression. She came in with Kerlix/Coban. 1/23; patient presents for follow-up. We have been using Santyl and Hydrofera Blue under 4-layer compression. Patient has no issues or complaints today. 2/1; patient presents for follow-up. Patient's been using Santyl and Hydrofera Blue to the wound bed. She has developed more slough and now a mild odor. She states she is wearing the Prevalon boot at night. It is unclear if she is offloading this area during the  day. 2/13; patient presents for follow-up. Patient's been using Dakin's wet-to-dry dressings. She received her Keystone antibiotic ointment in the mail. She has not used this yet. She reports using her Prevalon boot. She has no issues or complaints today. 2/20; patient presents for follow-up. She has been using Keystone antibiotic ointment with Hydrofera Blue. She has no issues or complaints today. Due to transportation she cannot do the total contact cast today. She is scheduled for next week to have this placed. 2/27; patient presents for follow-up. Plan is for the total contact cast today. We have been using Keystone and Hydrofera Blue to the wound bed. She forgot her Keystone antibiotic ointment. She has no issues or complaints today. 2/29; patient presents for follow-up. Patient presents for her obligatory cast change. She has no issues or complaints today. 3/5; patient presents for follow-up. She has had the cast in place for the past week and has tolerated this well. She has no issues or complaints today. We have been using PolyMem with Keystone antibiotic ointment. 12/15/2022: The wound is quite clean and measured smaller today. 3/19; we have been using Keystone antibiotic ointment with PolyMem silver under the total contact cast. Overall there is been improvement in wound healing. 3/26; patient presents for follow-up. She is been approved for epi fix. She would like to proceed with this today. Previously we have been using Keystone antibiotic ointment with PolyMem silver under the total contact cast. She has no issues or complaints. 01/05/2023: The wound is clean and she is tolerating total contact casting without difficulty. She had her first application of EpiFix last week. Patient History Information  obtained from Patient, Chart. Family History Cancer - Father,Mother, Diabetes - Mother,Maternal Grandparents,Paternal Grandparents, Heart Disease - Siblings,Maternal Grandparents, Hypertension  - Siblings, Tuberculosis - Maternal Grandparents,Paternal Grandparents. Planck, Faye C ([id number]) [id number].pdf Page 7 of 10 Social History Current every day smoker, Marital Status - Single, Alcohol Use - Never, Drug Use - No History, Caffeine Use - Daily. 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 no acute distress. Vitals Time Taken: 2:55 PM, Height: 66 in, Weight: 339 lbs, BMI: 54.7, Temperature: 99.9 F, Pulse: 89 bpm, Respiratory Rate: 20 breaths/min, Blood Pressure: 152/74 mmHg, Capillary Blood Glucose: 167 mg/dl. Respiratory Normal work of breathing on room air. General Notes: 01/05/2023: The wound appears to be contracting nicely with a good bed of granulation tissue. No concern for infection. There is some periwound tissue maceration. Integumentary (Hair, Skin) Wound #2 status is Open. Original cause of wound was Blister. The date acquired was: 06/15/2022. The wound has been in treatment 27 weeks. The wound is located on the Left,Distal Calcaneus. The wound measures 2.4cm length x 3.8cm width x 0.1cm depth; 7.163cm^2 area and 0.716cm^3 volume. There is Fat Layer (Subcutaneous Tissue) exposed. There is no tunneling or undermining noted. There is a medium amount of serosanguineous drainage noted. The wound margin is distinct with the outline attached to the wound base. There is  large (67-100%) red, pink, hyper - granulation within the wound bed. There is a small (1- 33%) amount of necrotic tissue within the wound bed including Adherent Slough. The periwound skin appearance had no abnormalities noted for texture. The periwound skin appearance had no abnormalities noted for color. The periwound skin appearance exhibited: Maceration. The periwound skin appearance did not exhibit: Dry/Scaly. Periwound temperature was noted as No Abnormality. Assessment Active Problems ICD-10 Non-pressure chronic ulcer of other part of left foot with fat layer exposed Type 2 diabetes mellitus with foot ulcer Morbid (severe) obesity due to excess calories Procedures Wound #2 Pre-procedure diagnosis of Wound #2 is a Diabetic Wound/Ulcer of the Lower Extremity located on the Left,Distal Calcaneus. A skin graft procedure using a bioengineered skin substitute/cellular or tissue based product was performed by Fredirick Maudlin, MD with the following instrument(s): Forceps and Scissors. Epifix was applied and secured with Steri-Strips and adaptic. 11 sq cm of product was utilized and 0 sq cm was wasted. Post Application, no dressing was applied. A Time Out was conducted at 15:16, prior to the start of the procedure. The procedure was tolerated well with a pain level of 0 throughout and a pain level of 0 following the procedure. Post procedure Diagnosis Wound #2: Same as Pre-Procedure . Pre-procedure diagnosis of Wound #2 is a Diabetic Wound/Ulcer of the Lower Extremity located on the Left,Distal Calcaneus . There was a T Contact Cast otal Groh, Mairi C [phone] [id number]) [phone].pdf Page 8 of 10 Procedure by Fredirick Maudlin, MD. Post procedure Diagnosis Wound #2: Same as Pre-Procedure Notes: size 4. Plan Follow-up Appointments: Return Appointment in 1 week. - Dr. Heber Newfield 01/12/2023 0900 room 7 Return Appointment in 2 weeks. - 01/19/2023 215pm Dr.Hoffman Other: - Leave  dressing in place.  Facility not to change dressing. Anesthetic: (In clinic) Topical Lidocaine 5% applied to wound bed (In clinic) Topical Lidocaine 4% applied to wound bed Cellular or Tissue Based Products: Cellular or Tissue Based Product Type: - Run IVR for EpiFix and Epicord=100% covered 05/07/22: Epicord ordered 12/08/2022: [employer] insurance for epicord and theraskin. Cellular or Tissue Based Product applied to wound bed, secured with steri-strips, cover with Adaptic or Mepitel. (DO NOT REMOVE). - Epicord #1 05/15/25 Epicord #2 05/22/22 Epicord #3 06/29/2022 Epicord #4 07/07/2022 Epifix #5 07/14/22 Epicord #6 07/20/22 Epicord # 7 07/27/22 ***Run for Grafix***PENDING Epicord #8 08/04/2022 2024-IVR epifix approved 12/29/2022 #1 epifix applied to left heel. 01/05/2023 #2 epifix applied to left heel. Bathing/ Shower/ Hygiene: May shower with protection but do not get wound dressing(s) wet. Protect dressing(s) with water repellant cover (for example, large plastic bag) or a cast cover and may then take shower. Edema Control - Lymphedema / SCD / Other: Elevate legs to the level of the heart or above for 30 minutes daily and/or when sitting for 3-4 times a day throughout the day. Avoid standing for long periods of time. Off-Loading: T Contact Cast to Left Lower Extremity - size 4 otal Additional Orders / Instructions: Follow Nutritious Diet - Monitor/Control Blood Sugar WOUND #2: - Calcaneus Wound Laterality: Left, Distal Cleanser: Soap and Water 1 x Per Week/30 Days Discharge Instructions: May shower and wash wound with dial antibacterial soap and water prior to dressing change. Cleanser: Wound Cleanser 1 x Per Week/30 Days Discharge Instructions: Cleanse the wound with wound cleanser prior to applying a clean dressing using gauze sponges, not tissue or cotton balls. Prim Dressing: epifix #2 1 x Per Week/30 Days ary Discharge Instructions: applied by provider Prim Dressing: adaptic and steri-strips 1 x  Per Week/30 Days ary Discharge Instructions: secured the epifix Secondary Dressing: ABD Pad, 8x10 1 x Per Week/30 Days Discharge Instructions: Apply over primary dressing as directed. Secondary Dressing: Woven Gauze Sponge, Non-Sterile 4x4 in (Home Health) 1 x Per Week/30 Days Discharge Instructions: Apply over primary dressing as directed. Secured With: [employer], 4.5x3.1 (in/yd) 1 x Per Week/30 Days Discharge Instructions: Secure with Kerlix as directed. 01/05/2023: The wound appears to be contracting nicely with a good bed of granulation tissue. No concern for infection. There is some periwound tissue maceration. The wound bed was prepared for EpiFix application. Zinc oxide was applied to the periwound. The skin substitute was applied in standard fashion, moistening it with saline and securing it with Adaptic and Steri-Strips. A total contact cast was then applied without difficulty or complication. She will follow-up in 1 week. Electronic Signature(s) Signed: 01/05/2023 3:41:37 PM By: Fredirick Maudlin MD FACS Entered By: Fredirick Maudlin on 01/05/2023 15:41:36 -------------------------------------------------------------------------------- HxROS Details Patient Name: Date of Service: [place] C. 01/05/2023 3:00 PM Medical Record Number: [id number] Patient Account Number: [id number] Date of Birth/Sex: Treating RN: [birth year] (62 y.o. F) Primary Care Provider: Jenean Lindau Other Clinician: Referring Provider: Treating Provider/Extender: Neita Garnet in [id number] Information Obtained From Patient Chart Hematologic/Lymphatic Medical History: Positive for: Anemia Germani, Pamla C ([id number]) [phone].pdf Page 9 of 10 Respiratory Medical History: Positive for: Chronic Obstructive Pulmonary Disease (COPD) Cardiovascular Medical History: Positive for: Congestive Heart Failure - Weighs self daily; Hypertension;  Myocardial Infarction; Peripheral Venous Disease Gastrointestinal Medical History: Past Medical History Notes: GERD, Peptic Ulcer Disease Endocrine Medical History: Positive for: Type II Diabetes Time with diabetes: 16 years Treated with: Insulin, Oral agents Blood sugar tested every  day: Yes Tested : Twice daily Musculoskeletal Medical History: Past Medical History Notes: Broke left leg 3 years ago, 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, foot fused Neurologic Medical History: Positive for: Neuropathy Psychiatric Medical History: Past Medical History Notes: Depression Immunizations Pneumococcal Vaccine: Received Pneumococcal Vaccination: No Implantable Devices None Family and Social History Cancer: Yes - Father,Mother; Diabetes: Yes - Mother,Maternal Grandparents,Paternal Grandparents; Heart Disease: Yes - Siblings,Maternal Grandparents; Hypertension: Yes - Siblings; Tuberculosis: Yes - Maternal Grandparents,Paternal Grandparents; Current every day smoker; Marital Status - Single; Alcohol Use: Never; Drug Use: No History; Caffeine Use: Daily; Financial Concerns: No; Food, Clothing or Shelter Needs: No; Support System Lacking: No; Transportation Concerns: No Engineer, maintenance) Signed: 01/05/2023 3:42:20 PM By: Fredirick Maudlin MD FACS Entered By: Fredirick Maudlin on 01/05/2023 15:31:19 -------------------------------------------------------------------------------- Total Contact Cast Details Patient Name: Date of Service: [place] C. 01/05/2023 3:00 PM Medical Record Number: [id number] Patient Account Number: [id number] Date of Birth/Sex: Treating RN: Jul 15, 1961 (62 y.o. [name], [name] Primary Care Provider: Jenean Lindau Other Clinician: Referring Provider: Treating Provider/Extender: Neita Garnet in [id number] T Contact Cast Applied for Wound Assessment: otal Wound #2 Left,Distal Calcaneus Performed By: Physician Fredirick Maudlin,  MD Post Procedure Diagnosis Same as Pre-procedure Lupinacci, Rochella C ([id number] Page 10 of 10 Notes size 4 Electronic Signature(s) Signed: 01/05/2023 3:42:20 PM By: Fredirick Maudlin MD FACS Signed: 01/05/2023 4:09:50 PM By: Deon Pilling RN, BSN Entered By: Deon Pilling on 01/05/2023 15:16:32 -------------------------------------------------------------------------------- SuperBill Details Patient Name: Date of Service: [name] 01/05/2023 Medical Record Number: [id number] Patient Account Number: [id number] Date of Birth/Sex: Treating RN: 08/27/1961 (62 y.o. F) Primary Care Provider: Jenean Lindau Other Clinician: Referring Provider: Treating Provider/Extender: Neita Garnet in Treatment: 43 Diagnosis Coding ICD-10 Codes Code Description [phone] Non-pressure chronic ulcer of other part of left foot with fat layer exposed E11.621 Type 2 diabetes mellitus with foot ulcer E66.01 Morbid (severe) obesity due to excess calories Facility Procedures : CPT4 Code: [id number] Description: [id number] Epifix 4cm x 4.5cm Modifier: Quantity: 11 : CPT4 Code: [id number] Description: [id number] - SKIN SUB GRAFT FACE/NK/HF/G ICD-10 Diagnosis Description L97.522 Non-pressure chronic ulcer of other part of left foot with fat layer exposed Modifier: Quantity: 1 Physician Procedures : CPT4 Code Description Modifier [id number] - WC PHYS LEVEL 3 - EST PT ICD-10 Diagnosis Description L97.522 Non-pressure chronic ulcer of other part of left foot with fat layer exposed E11.621 Type 2 diabetes mellitus with foot ulcer E66.01 Morbid  (severe) obesity due to excess calories Quantity: 1 : [id number] - WC PHYS SKIN SUB GRAFT FACE/NK/HF/G ICD-10 Diagnosis Description L97.522 Non-pressure chronic ulcer of other part of left foot with fat layer exposed Quantity: 1 Electronic Signature(s) Signed: 01/05/2023 3:50:45 PM By: Deon Pilling RN,  BSN Signed: 01/06/2023 7:37:09 AM By: Fredirick Maudlin MD FACS Previous Signature: 01/05/2023 3:42:05 PM Version By: Fredirick Maudlin MD FACS Entered By: Deon Pilling on 01/05/2023 15:50:45

## 2023-01-06 NOTE — Progress Notes (Signed)
[place], Demetric [name] ([id number]) [id number].pdf Page 1 of 7 Visit Report for 12/29/2022 Arrival Information Details Patient Name: Date of Service: [name] Lax [name]. 12/29/2022 8:00 [name] M Medical Record Number: [id number] Patient Account Number: [id number] Date of Birth/Sex: Treating RN: 1961-01-12 (62 y.o. [name], [name] Primary Care Kimarie Coor: Jenean Lindau Other Clinician: Referring Jamarrion Budai: Treating Bonita Brindisi/Extender: Elise Benne in Treatment: 21 Visit Information History Since Last Visit Added or deleted any medications: No Patient Arrived: Wheel Chair Any new allergies or adverse reactions: No Arrival Time: 08:25 Had [name] fall or experienced change in No Accompanied By: self activities of daily living that may affect Transfer Assistance: Manual risk of falls: Patient Identification Verified: Yes Signs or symptoms of abuse/neglect since last visito No Secondary Verification Process Completed: Yes Hospitalized since last visit: No Patient Requires Transmission-Based Precautions: No Implantable device outside of the clinic excluding No Patient Has Alerts: No cellular tissue based products placed in the center since last visit: Has Dressing in Place as Prescribed: Yes Has Footwear/Offloading in Place as Prescribed: Yes Left: T Contact Cast otal Pain Present Now: No Electronic Signature(s) Signed: 12/29/2022 4:22:41 PM By: Deon Pilling RN, BSN Entered By: Deon Pilling on 12/29/2022 08:26:07 -------------------------------------------------------------------------------- Encounter Discharge Information Details Patient Name: Date of Service: [name] Lax [name]. 12/29/2022 8:00 [name] M Medical Record Number: [id number] Patient Account Number: [id number] Date of Birth/Sex: Treating RN: 27-Sep-1961 (62 y.o. [name], [name] Primary Care Marsden Zaino: Jenean Lindau Other Clinician: Referring Jaysie Benthall: Treating Clotiel Troop/Extender: Elise Benne in Treatment: 42 Encounter Discharge Information Items Post Procedure Vitals Discharge Condition: Stable Temperature (F): 98.3 Ambulatory Status: Wheelchair Pulse (bpm): 84 Discharge Destination: Home Respiratory Rate (breaths/min): 20 Transportation: Private Auto Blood Pressure (mmHg): 136/78 Accompanied By: self Schedule Follow-up Appointment: Yes Clinical Summary of Care: Electronic Signature(s) Signed: 12/29/2022 4:22:41 PM By: Deon Pilling RN, BSN Entered By: Deon Pilling on 12/29/2022 08:57:50 -------------------------------------------------------------------------------- Lower Extremity Assessment Details Patient Name: Date of Service: [name], [name]. 12/29/2022 8:00 [name] M Medical Record Number: [id number] Patient Account Number: [id number] Date of Birth/Sex: Treating RN: 05-17-1961 (62 y.o. [name], [name] Primary Care Molli Gethers: Jenean Lindau Other Clinician: Referring Savera Donson: Treating Leylani Duley/Extender: Elise Benne in Treatment: 42 Edema Assessment Left: [Left: Right] [Right: :] Assessed: [Left: Yes] [Right: No] Edema: [Left: Ye] [Right: s] Calf Left: Right: Point of Measurement: 28 cm From Medial Instep 46 cm Ankle Left: Right: Point of Measurement: 3 cm From Medial Instep 30 cm Vascular Assessment Pulses: Dorsalis Pedis Palpable: [Left:Yes] Electronic Signature(s) Signed: 12/29/2022 4:22:41 PM By: Deon Pilling RN, BSN Entered By: Deon Pilling on 12/29/2022 08:26:47 -------------------------------------------------------------------------------- Multi Wound Chart Details Patient Name: Date of Service: [name] Lax [name]. 12/29/2022 8:00 [name] M Medical Record Number: [id number] Patient Account Number: [id number] Date of Birth/Sex: Treating RN: November 04, 1960 (62 y.o. F) Primary Care Marvine Encalade: Jenean Lindau Other Clinician: Referring Abdulahad Mederos: Treating Silver Parkey/Extender: Elise Benne in Treatment: 42 Vital Signs Height(in): 105 Capillary Blood Glucose(mg/dl): 168 Weight(lbs): 339 Pulse(bpm): 64 Body Mass Index(BMI): 54.7 Blood Pressure(mmHg): 136/78 Temperature(F): 98.3 Respiratory Rate(breaths/min): 20 [2:Photos:] [[name]:[name]] Left, Distal Calcaneus [name] Wound Location: Blister [name] Wounding Event: Diabetic Wound/Ulcer of the Lower [name] Primary Etiology: Extremity Anemia, Chronic Obstructive [name] Comorbid History: Pulmonary Disease (COPD), Congestive Heart Failure, Hypertension, Myocardial Infarction, Peripheral Venous Disease, Type II Diabetes, Neuropathy 06/15/2022 [name] Date Acquired: 67 [name] Weeks of Treatment: Open [name] Wound Status: No [name]  [name] Wound Recurrence: 2.5x4x0.2 [name] Measurements L x W x D (cm) 7.854 [name] [name] (cm) : rea 1.571 [name] Volume (cm) : 44.40% [name] % Reduction in [name] rea: -11.10% [name] % Reduction in Volume: Grade 2 [name] Classification: Medium [name] Exudate [name] mount: Serosanguineous [name] Exudate Type: red, brown [name] Exudate Color: [name], [name] ([id number].pdf Page 3 of 7 Distinct, outline attached [name] Wound Margin: Medium (34-66%) [name] Granulation Amount: Red, Pink [name] Granulation Quality: Medium (34-66%) [name] Necrotic Amount: Fat Layer (Subcutaneous Tissue): Yes [name] Exposed Structures: Fascia: No Tendon: No Muscle: No Joint: No Bone: No Small (1-33%) [name] Epithelialization: Debridement - Excisional [name] Debridement: Pre-procedure Verification/Time Out 08:35 [name] Taken: Lidocaine 4% Topical Solution [name] Pain Control: Callus, Subcutaneous, Slough [name] Tissue Debrided: Skin/Subcutaneous Tissue [name] Level: 10 [name] Debridement [name] (sq cm): rea Curette [name] Instrument: Minimum [name] Bleeding: Pressure [name] Hemostasis [name] chieved: 0 [name]  [name] Procedural Pain: 0 [name] Post Procedural Pain: Procedure was tolerated well [name] Debridement Treatment Response: 2.5x4.2x0.2 [name] Post Debridement Measurements L x W x D (cm) 1.649 [name] Post Debridement Volume: (cm) No Abnormalities Noted [name] Periwound Skin Texture: Maceration: No [name] Periwound Skin Moisture: Dry/Scaly: No No Abnormalities Noted [name] Periwound Skin Color: No Abnormality [name] Temperature: Cellular or Tissue Based Product [name] Procedures Performed: Debridement T Contact Cast otal Treatment Notes Wound #2 (Calcaneus) Wound Laterality: Left, Distal Cleanser Soap and Water Discharge Instruction: May shower and wash wound with dial antibacterial soap and water prior to dressing change. Wound Cleanser Discharge Instruction: Cleanse the wound with wound cleanser prior to applying [name] clean dressing using gauze sponges, not tissue or cotton balls. Peri-Wound Care Topical [place] Discharge Instruction: applied by Zhavia Cunanan under epifix Primary Dressing epifix Discharge Instruction: applied by Amazing Cowman cutimed sorbact swab and steri-strips Discharge Instruction: secured the epifix Secondary Dressing ABD Pad, 8x10 Discharge Instruction: Apply over primary dressing as directed. Woven Gauze Sponge, Non-Sterile 4x4 in Discharge Instruction: Apply over primary dressing as directed. Secured With [employer], 4.5x3.1 (in/yd) Discharge Instruction: Secure with Kerlix as directed. Compression Wrap Compression Stockings Add-Ons Electronic Signature(s) Signed: 12/29/2022 10:32:58 AM By: Kalman Shan DO Entered By: Kalman Shan on 12/29/2022 09:44:28 [name], [name] ([id number].pdf Page 4 of 7 -------------------------------------------------------------------------------- Multi-Disciplinary Care Plan Details Patient Name: Date of Service: [name] Lax [name]. 12/29/2022 8:00 [name] M Medical  Record Number: [id number] Patient Account Number: [id number] Date of Birth/Sex: Treating RN: Mar 22, 1961 (62 y.o. [name], [name] Primary Care Cinnamon Morency: Jenean Lindau Other Clinician: Referring Azaela Caracci: Treating Simrat Kendrick/Extender: Elise Benne in Treatment: 59 Active Inactive Abuse / Safety / Falls / Self Care Management Nursing Diagnoses: History of Falls Goals: Patient will not experience any injury related to falls Date Initiated: 03/05/2022 Target Resolution Date: 02/05/2023 Goal Status: Active Interventions: Assess fall risk on admission and as needed Assess: immobility, friction, shearing, incontinence upon admission and as needed Assess impairment of mobility on admission and as needed per policy Notes: [id number]: Fall prevention ongoing, recent fall. Wound/Skin Impairment Nursing Diagnoses: Impaired tissue integrity Goals: Patient/caregiver will verbalize understanding of skin care regimen Date Initiated: 03/05/2022 Target Resolution Date: 02/06/2023 Goal Status: Active Ulcer/skin breakdown will have [name] volume reduction of 30% by week 4 Date Initiated: 03/05/2022 Date Inactivated: 04/30/2022 Target Resolution Date: 05/02/2022 Goal Status: Met Ulcer/skin breakdown will  have [name] volume reduction of 50% by week 8 Date Initiated: 04/30/2022 Date Inactivated: 07/07/2022 Target Resolution Date: 07/04/2022 Unmet Reason: patient sent three Goal Status: Unmet weeks inpatient and rehab before returning to wound center. Interventions: Assess patient/caregiver ability to obtain necessary supplies Assess patient/caregiver ability to perform ulcer/skin care regimen upon admission and as needed Assess ulceration(s) every visit Provide education on smoking Provide education on ulcer and skin care Treatment Activities: Topical wound management initiated : 03/05/2022 Notes: 04/30/22: Wound care regimen continues. Electronic Signature(s) Signed: 12/29/2022 4:22:41  PM By: Deon Pilling RN, BSN Entered By: Deon Pilling on 12/29/2022 08:42:30 -------------------------------------------------------------------------------- Pain Assessment Details Patient Name: Date of Service: [name]. 12/29/2022 8:00 [name] M Medical Record Number: [id number] Patient Account Number: [id number] [name], [name] ([id number]) [phone].pdf Page 5 of 7 Date of Birth/Sex: Treating RN: [birth year] (62 y.o. [name] Primary Care Sanoe Hazan: Other Clinician: Jenean Lindau Referring Andersen Mckiver: Treating Marshae Azam/Extender: Elise Benne in Treatment: 42 Active Problems Location of Pain Severity and Description of Pain Patient Has Paino No Site Locations Rate the pain. Current Pain Level: 0 Pain Management and Medication Current Pain Management: Medication: No Cold Application: No Rest: No Massage: No Activity: No T.E.[name].S.: No Heat Application: No Leg drop or elevation: No Is the Current Pain Management Adequate: Adequate How does your wound impact your activities of daily livingo Sleep: No Bathing: No Appetite: No Relationship With Others: No Bladder Continence: No Emotions: No Bowel Continence: No Work: No Toileting: No Drive: No Dressing: No Hobbies: No Engineer, maintenance) Signed: 12/29/2022 4:22:41 PM By: Deon Pilling RN, BSN Entered By: Deon Pilling on 12/29/2022 08:26:19 -------------------------------------------------------------------------------- Patient/Caregiver Education Details Patient Name: Date of Service: [name], [name]. 3/26/2024andnbsp8:00 [name] M Medical Record Number: [id number] Patient Account Number: [id number] Date of Birth/Gender: Treating RN: 03-22-1961 (62 y.o. [name] Primary Care Physician: Jenean Lindau Other Clinician: Referring Physician: Treating Physician/Extender: Elise Benne in Treatment: 94 Education  Assessment Education Provided To: Patient Education Topics Provided Wound/Skin Impairment: Handouts: Caring for Your Ulcer Methods: Explain/Verbal Responses: Reinforcements needed [name], [name] ([id number]) [phone].pdf Page 6 of 7 Electronic Signature(s) Signed: 12/29/2022 4:22:41 PM By: Deon Pilling RN, BSN Entered By: Deon Pilling on 12/29/2022 08:42:43 -------------------------------------------------------------------------------- Wound Assessment Details Patient Name: Date of Service: [name], [name]. 12/29/2022 8:00 [name] M Medical Record Number: [id number] Patient Account Number: [id number] Date of Birth/Sex: Treating RN: 20-Mar-1961 (62 y.o. [name], [name] Primary Care TRUE Garciamartinez: Jenean Lindau Other Clinician: Referring Jacody Beneke: Treating Ranger Petrich/Extender: Elise Benne in Treatment: 42 Wound Status Wound Number: 2 Primary Diabetic Wound/Ulcer of the Lower Extremity Etiology: Wound Location: Left, Distal Calcaneus Wound Open Wounding Event: Blister Status: Date Acquired: 06/15/2022 Comorbid Anemia, Chronic Obstructive Pulmonary Disease (COPD), Weeks Of Treatment: 26 History: Congestive Heart Failure, Hypertension, Myocardial Infarction, Clustered Wound: No Peripheral Venous Disease, Type II Diabetes, Neuropathy Photos Wound Measurements Length: (cm) 2.5 Width: (cm) 4 Depth: (cm) 0.2 Area: (cm) 7.854 Volume: (cm) 1.571 % Reduction in Area: 44.4% % Reduction in Volume: -11.1% Epithelialization: Small (1-33%) Wound Description Classification: Grade 2 Wound Margin: Distinct, outline attached Exudate Amount: Medium Exudate Type: Serosanguineous Exudate Color: red, brown Foul Odor After Cleansing: No Slough/Fibrino Yes Wound Bed Granulation Amount: Medium (34-66%) Exposed Structure Granulation Quality: Red, Pink Fascia Exposed: No Necrotic Amount: Medium (34-66%) Fat Layer (Subcutaneous Tissue)  Exposed: Yes Tendon Exposed: No Muscle Exposed: No Joint Exposed: No Bone Exposed: No Periwound Skin Texture Texture Color  No Abnormalities Noted: Yes No Abnormalities Noted: Yes Moisture Temperature / Pain No Abnormalities Noted: No Temperature: No Abnormality Dry / Scaly: No Maceration: No Electronic Signature(s) Signed: 01/06/2023 4:26:45 PM By: Rhae Hammock RN [place], [name] ([id number].pdf Page 7 of 7 Entered By: Rhae Hammock on 12/29/2022 08:28:21 -------------------------------------------------------------------------------- Vitals Details Patient Name: Date of Service: [name] Lax [name]. 12/29/2022 8:00 [name] M Medical Record Number: [id number] Patient Account Number: [id number] Date of Birth/Sex: Treating RN: 30-Jun-1961 (62 y.o. [name], [name] Primary Care Rodolfo Gaster: Jenean Lindau Other Clinician: Referring Onda Kattner: Treating Airelle Everding/Extender: Elise Benne in Treatment: 42 Vital Signs Time Taken: 08:29 Temperature (F): 98.3 Height (in): 66 Pulse (bpm): 84 Weight (lbs): 339 Respiratory Rate (breaths/min): 20 Body Mass Index (BMI): 54.7 Blood Pressure (mmHg): 136/78 Capillary Blood Glucose (mg/dl): 168 Reference Range: 80 - 120 mg / dl Electronic Signature(s) Signed: 12/29/2022 4:22:41 PM By: Deon Pilling RN, BSN Entered By: Deon Pilling on 12/29/2022 08:30:11

## 2023-01-07 NOTE — Progress Notes (Signed)
Odriscoll, Melek C ([id number]) [id number].pdf Page 1 of 7 Visit Report for 01/05/2023 Arrival Information Details Patient Name: Date of Service: [name] 01/05/2023 3:00 PM Medical Record Number: [id number] Patient Account Number: [id number] Date of Birth/Sex: Treating RN: 27-Nov-1960 (62 y.o. [name], [name] Primary Care Tanaysha Alkins: Jenean Lindau Other Clinician: Referring Riah Kehoe: Treating Jaaliyah Lucatero/Extender: Neita Garnet in Treatment: 77 Visit Information History Since Last Visit Added or deleted any medications: No Patient Arrived: Wheel Chair Any new allergies or adverse reactions: No Arrival Time: 14:54 Had a fall or experienced change in No Accompanied By: caregiver activities of daily living that may affect Transfer Assistance: Manual risk of falls: Patient Identification Verified: Yes Signs or symptoms of abuse/neglect since last visito No Secondary Verification Process Completed: Yes Hospitalized since last visit: No Patient Requires Transmission-Based Precautions: No Implantable device outside of the clinic excluding No Patient Has Alerts: No cellular tissue based products placed in the center since last visit: Has Dressing in Place as Prescribed: Yes Has Footwear/Offloading in Place as Prescribed: Yes Left: T Contact Cast otal Pain Present Now: No Electronic Signature(s) Signed: 01/05/2023 4:09:50 PM By: Deon Pilling RN, BSN Entered By: Deon Pilling on 01/05/2023 14:55:22 -------------------------------------------------------------------------------- Encounter Discharge Information Details Patient Name: Date of Service: [name] 01/05/2023 3:00 PM Medical Record Number: [id number] Patient Account Number: [id number] Date of Birth/Sex: Treating RN: September 19, 1961 (62 y.o. [name] Primary Care Orris Perin: Jenean Lindau Other Clinician: Referring Christorpher Hisaw: Treating Nahal Wanless/Extender: Neita Garnet in Treatment: 79 Encounter Discharge Information Items Post Procedure Vitals Discharge Condition: Stable Temperature (F): 99.9 Ambulatory Status: Wheelchair Pulse (bpm): 89 Discharge Destination: Home Respiratory Rate (breaths/min): 20 Transportation: Private Auto Blood Pressure (mmHg): 152/74 Accompanied By: caregiver Schedule Follow-up Appointment: Yes Clinical Summary of Care: Electronic Signature(s) Signed: 01/05/2023 4:09:50 PM By: Deon Pilling RN, BSN Entered By: Deon Pilling on 01/05/2023 15:51:43 -------------------------------------------------------------------------------- Lower Extremity Assessment Details Patient Name: Date of Service: [name], [name]. 01/05/2023 3:00 PM Medical Record Number: [id number] Patient Account Number: [id number] Date of Birth/Sex: Treating RN: [birth year] (62 y.o. [name], [name] Primary Care Darcy Cordner: Jenean Lindau Other Clinician: Referring Ginia Rudell: Treating Sameera Betton/Extender: Neita Garnet in Treatment: 43 Edema Assessment Left: [Left: Right] Patrice Paradise: :] Assessed: [Left: Yes] [Right: No] Edema: [Left: Ye] [Right: s] Calf Left: Right: Point of Measurement: 28 cm From Medial Instep 46 cm Ankle Left: Right: Point of Measurement: 3 cm From Medial Instep 30 cm Vascular Assessment Pulses: Dorsalis Pedis Palpable: [Left:Yes] Electronic Signature(s) Signed: 01/05/2023 4:09:50 PM By: Deon Pilling RN, BSN Entered By: Deon Pilling on 01/05/2023 15:21:57 -------------------------------------------------------------------------------- Multi Wound Chart Details Patient Name: Date of Service: [name] 01/05/2023 3:00 PM Medical Record Number: [id number] Patient Account Number: [id number] Date of Birth/Sex: Treating RN: April 03, 1961 (62 y.o. F) Primary Care Avonna Iribe: Jenean Lindau Other Clinician: Referring Hartwell Vandiver: Treating Edmond Ginsberg/Extender: Neita Garnet in Treatment: 43 Vital Signs Height(in): 28 Capillary Blood Glucose(mg/dl): 167 Weight(lbs): 339 Pulse(bpm): 58 Body Mass Index(BMI): 54.7 Blood Pressure(mmHg): 152/74 Temperature(F): 99.9 Respiratory Rate(breaths/min): 20 [2:Photos:] [N/A:N/A] Left, Distal Calcaneus N/A N/A Wound Location: Blister N/A N/A Wounding Event: Diabetic Wound/Ulcer of the Lower N/A N/A Primary Etiology: Extremity Anemia, Chronic Obstructive N/A N/A Comorbid History: Pulmonary Disease (COPD), Congestive Heart Failure, Hypertension, Myocardial Infarction, Peripheral Venous Disease, Type II Diabetes, Neuropathy 06/15/2022 N/A N/A Date Acquired: 14 N/A N/A Weeks of Treatment: Open N/A N/A Wound Status: No N/A N/A Wound Recurrence: 2.4x3.8x0.1  N/A N/A Measurements L x W x D (cm) 7.163 N/A N/A A (cm) : rea 0.716 N/A N/A Volume (cm) : 49.30% N/A N/A % Reduction in A rea: 49.40% N/A N/A % Reduction in Volume: Grade 2 N/A N/A Classification: Medium N/A N/A Exudate A mount: Serosanguineous N/A N/A Exudate Type: red, brown N/A N/A Exudate Color: Kibby, Dylann C ([id number] Page 3 of 7 Distinct, outline attached N/A N/A Wound Margin: Large (67-100%) N/A N/A Granulation Amount: Red, Pink, Hyper-granulation N/A N/A Granulation Quality: Small (1-33%) N/A N/A Necrotic Amount: Fat Layer (Subcutaneous Tissue): Yes N/A N/A Exposed Structures: Fascia: No Tendon: No Muscle: No Joint: No Bone: No Small (1-33%) N/A N/A Epithelialization: Debridement - Selective/Open Wound N/A N/A Debridement: Pre-procedure Verification/Time Out 15:10 N/A N/A Taken: Lidocaine 4% Topical Solution N/A N/A Pain Control: Slough N/A N/A Tissue Debrided: Non-Viable Tissue N/A N/A Level: 9.12 N/A N/A Debridement A (sq cm): rea Curette N/A N/A Instrument: Minimum N/A N/A Bleeding: Pressure N/A N/A Hemostasis A chieved: 0 N/A  N/A Procedural Pain: 0 N/A N/A Post Procedural Pain: Procedure was tolerated well N/A N/A Debridement Treatment Response: 2.4x3.8x0.1 N/A N/A Post Debridement Measurements L x W x D (cm) 0.716 N/A N/A Post Debridement Volume: (cm) No Abnormalities Noted N/A N/A Periwound Skin Texture: Maceration: Yes N/A N/A Periwound Skin Moisture: Dry/Scaly: No No Abnormalities Noted N/A N/A Periwound Skin Color: No Abnormality N/A N/A Temperature: Cellular or Tissue Based Product N/A N/A Procedures Performed: Debridement T Contact Cast otal Treatment Notes Electronic Signature(s) Signed: 01/05/2023 3:30:08 PM By: Fredirick Maudlin MD FACS Entered By: Fredirick Maudlin on 01/05/2023 15:30:08 -------------------------------------------------------------------------------- Multi-Disciplinary Care Plan Details Patient Name: Date of Service: [name] 01/05/2023 3:00 PM Medical Record Number: [id number] Patient Account Number: [id number] Date of Birth/Sex: Treating RN: 09-10-1961 (62 y.o. [name], [name] Primary Care Danett Palazzo: Jenean Lindau Other Clinician: Referring Dazhane Villagomez: Treating Ebonie Westerlund/Extender: Neita Garnet in Treatment: 97 Active Inactive Abuse / Safety / Falls / Self Care Management Nursing Diagnoses: History of Falls Goals: Patient will not experience any injury related to falls Date Initiated: 03/05/2022 Target Resolution Date: 02/05/2023 Goal Status: Active Interventions: Assess fall risk on admission and as needed Assess: immobility, friction, shearing, incontinence upon admission and as needed Assess impairment of mobility on admission and as needed per policy Notes: [id number]: Fall prevention ongoing, recent fall. Wound/Skin Impairment Nursing Diagnoses: Brucker, Niva C ([id number]) [id number] Page 4 of 7 Impaired tissue integrity Goals: Patient/caregiver will verbalize understanding of skin care  regimen Date Initiated: 03/05/2022 Target Resolution Date: 02/06/2023 Goal Status: Active Ulcer/skin breakdown will have a volume reduction of 30% by week 4 Date Initiated: 03/05/2022 Date Inactivated: 04/30/2022 Target Resolution Date: 05/02/2022 Goal Status: Met Ulcer/skin breakdown will have a volume reduction of 50% by week 8 Date Initiated: 04/30/2022 Date Inactivated: 07/07/2022 Target Resolution Date: 07/04/2022 Unmet Reason: patient sent three Goal Status: Unmet weeks inpatient and rehab before returning to wound center. Interventions: Assess patient/caregiver ability to obtain necessary supplies Assess patient/caregiver ability to perform ulcer/skin care regimen upon admission and as needed Assess ulceration(s) every visit Provide education on smoking Provide education on ulcer and skin care Treatment Activities: Topical wound management initiated : 03/05/2022 Notes: 04/30/22: Wound care regimen continues. Electronic Signature(s) Signed: 01/05/2023 4:09:50 PM By: Deon Pilling RN, BSN Entered By: Deon Pilling on 01/05/2023 15:15:50 -------------------------------------------------------------------------------- Pain Assessment Details Patient Name: Date of Service: Krupp C. 01/05/2023 3:00 PM Medical Record Number: [id number] Patient Account Number: [id number] Date of  Birth/Sex: Treating RN: 1961-08-14 (62 y.o. [name] Primary Care Yamato Kopf: Jenean Lindau Other Clinician: Referring Rasa Degrazia: Treating Ignatius Kloos/Extender: Neita Garnet in [id number] Active Problems Location of Pain Severity and Description of Pain Patient Has Paino No Site Locations Rate the pain. Current Pain Level: 0 Pain Management and Medication Current Pain Management: Medication: No Cold Application: No Rest: No Massage: No Activity: No T.E.N.S.: No Heat Application: No Leg drop or elevation: No Is the Current Pain Management Adequate:  Adequate Berlinger, Nadiyah C ([id number] Page 5 of 7 How does your wound impact your activities of daily livingo Sleep: No Bathing: No Appetite: No Relationship With Others: No Bladder Continence: No Emotions: No Bowel Continence: No Work: No Toileting: No Drive: No Dressing: No Hobbies: No Engineer, maintenance) Signed: 01/05/2023 4:09:50 PM By: Deon Pilling RN, BSN Entered By: Deon Pilling on 01/05/2023 14:55:50 -------------------------------------------------------------------------------- Patient/Caregiver Education Details Patient Name: Date of Service: [name] 4/2/2024andnbsp3:00 PM Medical Record Number: [id number] Patient Account Number: [id number] Date of Birth/Gender: Treating RN: Oct 09, 1960 (62 y.o. [name] Primary Care Physician: Jenean Lindau Other Clinician: Referring Physician: Treating Physician/Extender: Neita Garnet in Treatment: 71 Education Assessment Education Provided To: Patient Education Topics Provided Wound/Skin Impairment: Handouts: Caring for Your Ulcer Methods: Explain/Verbal Responses: Reinforcements needed Electronic Signature(s) Signed: 01/05/2023 4:09:50 PM By: Deon Pilling RN, BSN Entered By: Deon Pilling on 01/05/2023 15:16:08 -------------------------------------------------------------------------------- Wound Assessment Details Patient Name: Date of Service: HUFFMA Tracie Harrier C. 01/05/2023 3:00 PM Medical Record Number: [id number] Patient Account Number: [id number] Date of Birth/Sex: Treating RN: [birth year] (62 y.o. [name], [name] Primary Care Kamren Heskett: Jenean Lindau Other Clinician: Referring Kinzlee Selvy: Treating Abygale Karpf/Extender: Neita Garnet in Treatment: 43 Wound Status Wound Number: 2 Primary Diabetic Wound/Ulcer of the Lower Extremity Etiology: Wound Location: Left, Distal Calcaneus Wound Open Wounding  Event: Blister Status: Date Acquired: 06/15/2022 Comorbid Anemia, Chronic Obstructive Pulmonary Disease (COPD), Weeks Of Treatment: 27 History: Congestive Heart Failure, Hypertension, Myocardial Infarction, Clustered Wound: No Peripheral Venous Disease, Type II Diabetes, Neuropathy Photos Stocks, Annalysia C ([id number] Page 6 of 7 Wound Measurements Length: (cm) 2.4 Width: (cm) 3.8 Depth: (cm) 0.1 Area: (cm) 7.163 Volume: (cm) 0.716 % Reduction in Area: 49.3% % Reduction in Volume: 49.4% Epithelialization: Small (1-33%) Tunneling: No Undermining: No Wound Description Classification: Grade 2 Wound Margin: Distinct, outline attached Exudate Amount: Medium Exudate Type: Serosanguineous Exudate Color: red, brown Foul Odor After Cleansing: No Slough/Fibrino Yes Wound Bed Granulation Amount: Large (67-100%) Exposed Structure Granulation Quality: Red, Pink, Hyper-granulation Fascia Exposed: No Necrotic Amount: Small (1-33%) Fat Layer (Subcutaneous Tissue) Exposed: Yes Necrotic Quality: Adherent Slough Tendon Exposed: No Muscle Exposed: No Joint Exposed: No Bone Exposed: No Periwound Skin Texture Texture Color No Abnormalities Noted: Yes No Abnormalities Noted: Yes Moisture Temperature / Pain No Abnormalities Noted: No Temperature: No Abnormality Dry / Scaly: No Maceration: Yes Treatment Notes Wound #2 (Calcaneus) Wound Laterality: Left, Distal Cleanser Soap and Water Discharge Instruction: May shower and wash wound with dial antibacterial soap and water prior to dressing change. Wound Cleanser Discharge Instruction: Cleanse the wound with wound cleanser prior to applying a clean dressing using gauze sponges, not tissue or cotton balls. Peri-Wound Care Topical Primary Dressing epifix #2 Discharge Instruction: applied by Rande Roylance adaptic and steri-strips Discharge Instruction: secured the epifix Secondary Dressing ABD Pad,  8x10 Discharge Instruction: Apply over primary dressing as directed. Woven Gauze Sponge, Non-Sterile 4x4 in Discharge Instruction: Apply over primary dressing as  directed. Secured With [employer], 4.5x3.1 (in/yd) Discharge Instruction: Secure with Kerlix as directed. Maisel, Shizue C ([id number]) [id number] Page 7 of 7 Compression Wrap Compression Stockings Add-Ons Electronic Signature(s) Signed: 01/05/2023 4:09:50 PM By: Deon Pilling RN, BSN Signed: 01/07/2023 11:27:10 AM By: Sandre Kitty Entered By: Sandre Kitty on 01/05/2023 15:02:46 -------------------------------------------------------------------------------- Vitals Details Patient Name: Date of Service: [name], [name]. 01/05/2023 3:00 PM Medical Record Number: [id number] Patient Account Number: [id number] Date of Birth/Sex: Treating RN: [birth year] (62 y.o. [name], [name] Primary Care Aunika Kirsten: Jenean Lindau Other Clinician: Referring Decarlo Rivet: Treating Tison Leibold/Extender: Neita Garnet in Treatment: 43 Vital Signs Time Taken: 14:55 Temperature (F): 99.9 Height (in): 66 Pulse (bpm): 89 Weight (lbs): 339 Respiratory Rate (breaths/min): 20 Body Mass Index (BMI): 54.7 Blood Pressure (mmHg): 152/74 Capillary Blood Glucose (mg/dl): 167 Reference Range: 80 - 120 mg / dl Electronic Signature(s) Signed: 01/05/2023 4:09:50 PM By: Deon Pilling RN, BSN Entered By: Deon Pilling on 01/05/2023 14:55:41

## 2023-01-12 ENCOUNTER — Encounter (HOSPITAL_BASED_OUTPATIENT_CLINIC_OR_DEPARTMENT_OTHER): Payer: 59 | Attending: Internal Medicine | Admitting: Internal Medicine

## 2023-01-12 DIAGNOSIS — Z794 Long term (current) use of insulin: Secondary | ICD-10-CM | POA: Insufficient documentation

## 2023-01-12 DIAGNOSIS — I5032 Chronic diastolic (congestive) heart failure: Secondary | ICD-10-CM | POA: Diagnosis not present

## 2023-01-12 DIAGNOSIS — I110 Hypertensive heart disease with heart failure: Secondary | ICD-10-CM | POA: Insufficient documentation

## 2023-01-12 DIAGNOSIS — E1140 Type 2 diabetes mellitus with diabetic neuropathy, unspecified: Secondary | ICD-10-CM | POA: Diagnosis not present

## 2023-01-12 DIAGNOSIS — L97522 Non-pressure chronic ulcer of other part of left foot with fat layer exposed: Secondary | ICD-10-CM | POA: Diagnosis not present

## 2023-01-12 DIAGNOSIS — E11621 Type 2 diabetes mellitus with foot ulcer: Secondary | ICD-10-CM | POA: Insufficient documentation

## 2023-01-12 DIAGNOSIS — I252 Old myocardial infarction: Secondary | ICD-10-CM | POA: Diagnosis not present

## 2023-01-12 NOTE — Progress Notes (Signed)
[name] ([id number]) [id number] Page 1 of 11 Visit Report for 01/12/2023 Chief Complaint Document Details Patient Name: Date of Service: [name]. 01/12/2023 9:00 [name] Medical Record Number: [id number] Patient Account Number: [id number] Date of Birth/Sex: Treating RN: April 09, 1961 (62 y.o. F) Primary Care Provider: [name] Other Clinician: Referring Provider: Treating Provider/Extender: [name] in Treatment: 78 Information Obtained from: Patient Chief Complaint 03/05/2022; left foot wound Electronic Signature(s) Signed: 01/12/2023 12:40:48 PM By: Geralyn Corwin DO Entered By: Geralyn Corwin on 01/12/2023 09:31:59 -------------------------------------------------------------------------------- Cellular or Tissue Based Product Details Patient Name: Date of Service: [name], [name]. 01/12/2023 9:00 [name] Medical Record Number: [id number] Patient Account Number: [id number] Date of Birth/Sex: Treating RN: Dec 19, 1960 (62 y.o. F) Primary Care Provider: [name] Other Clinician: Referring Provider: Treating Provider/Extender: [name] in Treatment: 44 Cellular or Tissue Based Product Type Wound #2 Left,Distal Calcaneus Applied to: Performed By: Physician Geralyn Corwin, DO Cellular or Tissue Based Product Type: Epifix Level of Consciousness (Pre-procedure): Awake and Alert Pre-procedure Verification/Time Out Yes - 09:20 Taken: Location: genitalia / hands / feet / multiple digits Wound Size (sq cm): 7.2 Product Size (sq cm): 10.2 Waste Size (sq cm): 0 Amount of Product Applied (sq cm): 10.2 Instrument Used: Forceps Lot #: [id number] Expiration Date: 08/06/2027 Fenestrated: No Reconstituted: Yes Solution Type: normal saline Solution Amount: 3ml Lot #: [id number] Solution Expiration Date: 03/04/2025 Secured: Yes Secured With: Steri-Strips Dressing  Applied: Yes Primary Dressing: adaptic, gauze Procedural Pain: 0 Post Procedural Pain: 0 Response to Treatment: Procedure was tolerated well Level of Consciousness (Post- Awake and Alert procedure): Post Procedure Diagnosis Same as Pre-procedure Notes Scribed for Dr Mikey Bussing by Redmond Pulling, Rn Electronic Signature(s) [place], [name] ([id number]) [phone].pdf Page 2 of 11 Signed: 01/13/2023 2:30:49 PM By: Pearletha Alfred Signed: 01/14/2023 10:39:43 AM By: Geralyn Corwin DO Previous Signature: 01/12/2023 12:40:48 PM Version By: Geralyn Corwin DO Previous Signature: 01/12/2023 3:40:23 PM Version By: Redmond Pulling RN, BSN Entered By: Pearletha Alfred on 01/13/2023 14:30:49 -------------------------------------------------------------------------------- Debridement Details Patient Name: Date of Service: [name], [name]. 01/12/2023 9:00 [name] Medical Record Number: [id number] Patient Account Number: [id number] Date of Birth/Sex: Treating RN: 1961-06-26 (61 y.o. [name] Primary Care Provider: [name] Other Clinician: Referring Provider: Treating Provider/Extender: [name] in Treatment: 44 Debridement Performed for Assessment: Wound #2 Left,Distal Calcaneus Performed By: Physician Geralyn Corwin, DO Debridement Type: Debridement Severity of Tissue Pre Debridement: Fat layer exposed Level of Consciousness (Pre-procedure): Awake and Alert Pre-procedure Verification/Time Out Yes - 09:20 Taken: Start Time: 09:22 Pain Control: Lidocaine 5% topical ointment T Area Debrided (L x W): otal 2.4 (cm) x 3 (cm) = 7.2 (cm) Tissue and other material debrided: Non-Viable, Slough, Slough Level: Non-Viable Tissue Debridement Description: Selective/Open Wound Instrument: Curette Bleeding: Minimum Hemostasis Achieved: Pressure Procedural Pain: 0 Post Procedural Pain: 0 Response to Treatment: Procedure was tolerated well Level  of Consciousness (Post- Awake and Alert procedure): Post Debridement Measurements of Total Wound Length: (cm) 2.4 Width: (cm) 3 Depth: (cm) 0.1 Volume: (cm) 0.565 Character of Wound/Ulcer Post Debridement: Improved Severity of Tissue Post Debridement: Fat layer exposed Post Procedure Diagnosis Same as Pre-procedure Notes Scribed for Dr Mikey Bussing by Redmond Pulling, Rn Electronic Signature(s) Signed: 01/12/2023 12:40:48 PM By: Geralyn Corwin DO Signed: 01/12/2023 3:40:23 PM By: Redmond Pulling RN, BSN Entered By: Redmond Pulling on 01/12/2023 09:24:45 -------------------------------------------------------------------------------- HPI Details Patient Name: Date of Service: [name], [name] MY  [name]. 01/12/2023 9:00 [name] Medical Record Number: [id number] Patient Account Number: [id number] Date of Birth/Sex: Treating RN: 03/10/1961 (62 y.o. F) Primary Care Provider: Junious Dresserampbell, Stephen Other Clinician: Referring Provider: Treating Provider/Extender: Andree MoroHoffman, Kannen Moxey Campbell, Stephen Weeks in Treatment: 44 History of Present Illness HPI Description: Admission 03/05/2022 [name] is [name] 62 year old female with [name] past medical history of uncontrolled insulin-dependent type 2 diabetes, tobacco user and chronic diastolic [name], [name] ([id number]) [id number] Page 3 of 11 heart failure that presents to the clinic for [name] 4469-month history of wound to her left heel. She states she had [name] left ankle fusion in September 2022. She states that she always had [name] wound after the surgery and it never healed. She has home health and they have been doing compression wraps along with silver alginate to the wound bed. She currently denies signs of infection. 6/8; this is [name] 62 year old woman with type 2 diabetes. She developed [name] wound on her left Achilles heel just above the tip of the heel in the setting of recurrent ankle surgeries in late 2022. I have seen some of these results from either the  cast or the surgical boots that are put on after these operations. She thinks this may be the case. And [name] sense of pressure ulcer. In any case that she has been using Santyl Hydrofera Blue under compression. She is not wearing any footwear at home as she cannot find anything to accommodate the wrap 6/22; patient presents for follow-up. She has home health that comes out once [name] week to help with dressing changes. She has no issues or complaints today. We have been using Hydrofera Blue under compression therapy. 7/6; patient presents for follow-up. She states that home health did not come out for the past 2 weeks. It is unclear why. We have been using Hydrofera Blue and Santyl under compression therapy. 7/13; patient presents for follow-up. She did not take the oral antibiotics prescribed at last clinic visit. We have been using Hydrofera Blue with gentamicin/mupirocin ointment under compression therapy. She has no issues or complaints today. She denies signs of infection. 7/20; patient presents for follow-up. We have been using Hydrofera Blue with antibiotic ointment under 3 layer compression. She has home health that change the dressing once. They put collagen on the wound bed. She also reports falling yesterday and hitting her right foot. She has no pain to the area today. 7/27; patient presents for follow-up. We have been using Hydrofera Blue under 3 layer compression. She has home health that changes the dressing. She has no issues or complaints today. 8/3; patient presents for follow-up. We continues to use Hydrofera Blue under 3 layer compression. She has no issues or complaints today. She has been approved for Epicord at 100%. 8/11; patient presents for follow-up. We have been using Hydrofera Blue under 3 layer compression. She has been approved for Epicord and we have this today. She is in agreement with having this applied. 8/18; patient presents for follow-up. Epicord #1 was placed in  standard fashion last clinic visit. She has no issues or complaints today. 9/18; Unfortunately patient has missed her last clinic appointments due to falling and breaking her right ankle. We have been following her for her left ankle wound. She has also developed [name] large blister to the left heel over the past week that has ruptured and dried. [facility] [place]. Wound9/25; patient presents for follow-up. We have been using Hydrofera Blue and Santyl to the original  left ankle wound and Xeroform to the dried blistered. We have been wrapping her with compression therapy. She resides in [name] facility and they changed the wrap once this past week. She has no issues or complaints today. She denies signs of infection. 10/3; patient presents for follow-up. We have been using Xeroform to the heel wound and Epicort to the left ankle wound All under compression therapy. She has no issues or complaints today. 10/10; patient presents for follow-up. We have been using Epicort to the left ankle wound and Santyl and Hydrofera Blue to the heel wound. All under compression therapy. she has no issues or complaints today. 10/16; patient presents for follow-up. Epicort was placed in standard fashion to the superior wound and onto the heel and Santyl and Hydrofera Blue. She states that the wrap got wet during her shower and her facility was able to rewrap with Kerlix/Coban. 10/23; patient presents for follow-up. Epicord was placed to the wound beds last clinic visit. We continue Kerlix/Coban. She has no issues or complaints today. 10/31; Patient presents for follow-up. Epicord was placed to the wound beds last clinic visit. The original wound is almost healed. We have done this under Kerlix/Coban. She denies signs of infection. 11/10; patient presents for follow-up. Patient's last epi cord was placed in standard fashion at last clinic visit. The original wound has healed. She still has [name] heel wound.  Grafix was approved however too costly for the patient. We will rerun epi cord to see if she can have more applications. She had done very well with this. We have been using Hydrofera Blue to the heel under Kerlix/Coban. 11/21; patient presents for follow-up. We have been using Hydrofera Blue and Santyl to the heel under Kerlix/Coban. She switched to [name] new healthcare insurance and again The skin substitute is too costly for the patient. She denies signs of infection. 11/28; patient presents for follow-up. Her facility did not change the wrap last clinic visit due to lack of staffing. The wrap was taken off and Hydrofera Blue dressing has been in place for the past week. She has no issues or complaints today. 12/5; patient presents for follow-up. She states she has had more drainage over the past week. They did not change the wrap at her facility. She states the nurse will be back this week. She is also been doing [name] lot of physical therapy. She has been using the Prevalon boot while in bed. 12/12; patient presents for follow-up. We have been using Hydrofera Blue with antibiotic ointment under 4-layer compression. She is tolerated the wrap well. She states she is using her Prevalon boot while in the wheelchair. She has no issues or complaints today. There is been improvement in wound healing. 12/19; patient presents for follow-up. We have been using Hydrofera Blue and antibiotic ointment to the wound bed under 4-layer compression. Her facility change the wrap once. She states she has been using the Prevalon boot in bed but not in the wheelchair due to issues with transferring. Overall there is still improvement in wound healing. 1/16; patient presents for follow-up. She missed her last clinic appointment. We have been using Hydrofera Blue and antibiotic ointment under 4-layer compression. At the facility this has only been changed twice over the past 3 weeks. They are not using 4-layer compression. She  came in with Kerlix/Coban. 1/23; patient presents for follow-up. We have been using Santyl and Hydrofera Blue under 4-layer compression. Patient has no issues or complaints today. 2/1; patient presents for  follow-up. Patient's been using Santyl and Hydrofera Blue to the wound bed. She has developed more slough and now [name] mild odor. She states she is wearing the Prevalon boot at night. It is unclear if she is offloading this area during the day. 2/13; patient presents for follow-up. Patient's been using Dakin's wet-to-dry dressings. She received her Keystone antibiotic ointment in the mail. She has not used this yet. She reports using her Prevalon boot. She has no issues or complaints today. 2/20; patient presents for follow-up. She has been using Keystone antibiotic ointment with Hydrofera Blue. She has no issues or complaints today. Due to transportation she cannot do the total contact cast today. She is scheduled for next week to have this placed. 2/27; patient presents for follow-up. Plan is for the total contact cast today. We have been using Keystone and Hydrofera Blue to the wound bed. She forgot her Keystone antibiotic ointment. She has no issues or complaints today. 2/29; patient presents for follow-up. Patient presents for her obligatory cast change. She has no issues or complaints today. [name], [name] ([id number]) [id number] Page 4 of 11 3/5; patient presents for follow-up. She has had the cast in place for the past week and has tolerated this well. She has no issues or complaints today. We have been using PolyMem with Keystone antibiotic ointment. 12/15/2022: The wound is quite clean and measured smaller today. 3/19; we have been using Keystone antibiotic ointment with PolyMem silver under the total contact cast. Overall there is been improvement in wound healing. 3/26; patient presents for follow-up. She is been approved for epi fix. She would like to proceed  with this today. Previously we have been using Keystone antibiotic ointment with PolyMem silver under the total contact cast. She has no issues or complaints. 01/05/2023: The wound is clean and she is tolerating total contact casting without difficulty. She had her first application of EpiFix last week. 4/9; patient presents for follow-up. We have been applying EpiFix under the total contact cast for the past 2 weeks. She is tolerated this well. She has no issues or complaints today. Electronic Signature(s) Signed: 01/12/2023 12:40:48 PM By: Geralyn Corwin DO Entered By: Geralyn Corwin on 01/12/2023 09:44:20 -------------------------------------------------------------------------------- Physical Exam Details Patient Name: Date of Service: [name] [name], [name]. 01/12/2023 9:00 [name] Medical Record Number: [id number] Patient Account Number: [id number] Date of Birth/Sex: Treating RN: November 06, 1960 (62 y.o. F) Primary Care Provider: [name] Other Clinician: Referring Provider: Treating Provider/Extender: [name] in Treatment: 44 Constitutional respirations regular, non-labored and within target range for patient.. Cardiovascular 2+ dorsalis pedis/posterior tibialis pulses. Psychiatric pleasant and cooperative. Notes Open wound with granulation tissue and nonviable tissue at the heel. No signs of surrounding infection. Electronic Signature(s) Signed: 01/12/2023 12:40:48 PM By: Geralyn Corwin DO Entered By: Geralyn Corwin on 01/12/2023 09:45:10 -------------------------------------------------------------------------------- Physician Orders Details Patient Name: Date of Service: [name], [name]. 01/12/2023 9:00 [name] Medical Record Number: [id number] Patient Account Number: [id number] Date of Birth/Sex: Treating RN: 06-04-1961 (62 y.o. [name] Primary Care Provider: [name] Other Clinician: Referring Provider: Treating  Provider/Extender: [name]4 Verbal / Phone Orders: No Diagnosis Coding Follow-up Appointments ppointment in 1 week. - Dr. Mikey Bussing 01/19/2023 215pm Return [name] ppointment in 2 weeks. - 01/26/23 @ 8:15 Return [name] Other: - Leave dressing in place. Facility not to change dressing. Anesthetic (In clinic) Topical Lidocaine 5% applied to wound bed (In clinic) Topical Lidocaine 4% applied  to wound bed Cellular or Tissue Based Products Cellular or Tissue Based Product Type: - Run IVR for EpiFix and Epicord=100% covered 05/07/22: Epicord ordered 12/08/2022: Run insurance for epicord and theraskin. daptic or Mepitel. (DO NOT REMOVE). - Cellular or Tissue Based Product applied to wound bed, secured with steri-strips, cover with [name] Kerth, Mahi [name] ([id number]) [id number] Page 5 of 11 Epicord #1 05/15/25 Epicord #2 05/22/22 Epicord #3 06/29/2022 Epicord #4 07/07/2022 Epifix #5 07/14/22 Epicord #6 07/20/22 Epicord # 7 07/27/22 ***Run for Grafix***PENDING Epicord #8 08/04/2022 2024-IVR epifix approved 12/29/2022 #1 epifix applied to left heel. 01/05/2023 #2 epifix applied to left heel. 01/12/2023 #3 epifix applied to left heel Bathing/ Shower/ Hygiene May shower with protection but do not get wound dressing(s) wet. Protect dressing(s) with water repellant cover (for example, large plastic bag) or [name] cast cover and may then take shower. Edema Control - Lymphedema / SCD / Other Elevate legs to the level of the heart or above for 30 minutes daily and/or when sitting for 3-4 times [name] day throughout the day. Avoid standing for long periods of time. Off-Loading Total Contact Cast to Left Lower Extremity - size 4 Additional Orders / Instructions Follow Nutritious Diet - Monitor/Control Blood Sugar Wound Treatment Wound #2 - Calcaneus Wound Laterality: Left, Distal Cleanser: Soap and Water 1 x Per Week/30 Days Discharge Instructions: May  shower and wash wound with dial antibacterial soap and water prior to dressing change. Cleanser: Wound Cleanser 1 x Per Week/30 Days Discharge Instructions: Cleanse the wound with wound cleanser prior to applying [name] clean dressing using gauze sponges, not tissue or cotton balls. Prim Dressing: epifix #2 1 x Per Week/30 Days ary Discharge Instructions: applied by provider Prim Dressing: adaptic and steri-strips 1 x Per Week/30 Days ary Discharge Instructions: secured the epifix Secondary Dressing: ABD Pad, 8x10 1 x Per Week/30 Days Discharge Instructions: Apply over primary dressing as directed. Secondary Dressing: Woven Gauze Sponge, Non-Sterile 4x4 in (Home Health) 1 x Per Week/30 Days Discharge Instructions: Apply over primary dressing as directed. Secured With: [employer], 4.5x3.1 (in/yd) 1 x Per Week/30 Days Discharge Instructions: Secure with Kerlix as directed. Patient Medications llergies: ceftriaxone [name] Notifications Medication Indication Start End prior to debridement 01/12/2023 lidocaine DOSE topical 5 % ointment - ointment topical once daily Electronic Signature(s) Signed: 01/12/2023 12:40:48 PM By: Geralyn Corwin DO Entered By: Geralyn Corwin on 01/12/2023 09:45:19 -------------------------------------------------------------------------------- Problem List Details Patient Name: Date of Service: [name]. 01/12/2023 9:00 [name] Medical Record Number: [id number] Patient Account Number: [id number] Date of Birth/Sex: Treating RN: 1961-01-17 (62 y.o. F) Primary Care Provider: [name] Other Clinician: Referring Provider: Treating Provider/Extender: [name] Anet [name] ([id number]) [id number] Page 6 of 11 Active Problems ICD-10 Encounter Code Description Active Date MDM Diagnosis L97.522 Non-pressure chronic ulcer of other part of left foot with fat layer exposed  03/05/2022 No Yes E11.621 Type 2 diabetes mellitus with foot ulcer 03/05/2022 No Yes E66.01 Morbid (severe) obesity due to excess calories 03/05/2022 No Yes Inactive Problems Resolved Problems Electronic Signature(s) Signed: 01/12/2023 12:40:48 PM By: Geralyn Corwin DO Entered By: Geralyn Corwin on 01/12/2023 09:31:44 -------------------------------------------------------------------------------- Progress Note Details Patient Name: Date of Service: [name]. 01/12/2023 9:00 [name] Medical Record Number: [id number] Patient Account Number: [id number] Date of Birth/Sex: Treating RN: [birth year] (62 y.o. F) Primary Care Provider: [name] Other Clinician: Referring Provider: Treating Provider/Extender: [name] in Treatment: 44 Subjective  Chief Complaint Information obtained from Patient 03/05/2022; left foot wound History of Present Illness (HPI) Admission 03/05/2022 Ms. Shirlie Gonterman is [name] 62 year old female with [name] past medical history of uncontrolled insulin-dependent type 2 diabetes, tobacco user and chronic diastolic heart failure that presents to the clinic for [name] 1-month history of wound to her left heel. She states she had [name] left ankle fusion in September 2022. She states that she always had [name] wound after the surgery and it never healed. She has home health and they have been doing compression wraps along with silver alginate to the wound bed. She currently denies signs of infection. 6/8; this is [name] 62 year old woman with type 2 diabetes. She developed [name] wound on her left Achilles heel just above the tip of the heel in the setting of recurrent ankle surgeries in late 2022. I have seen some of these results from either the cast or the surgical boots that are put on after these operations. She thinks this may be the case. And [name] sense of pressure ulcer. In any case that she has been using Santyl Hydrofera Blue under compression. She is not wearing  any footwear at home as she cannot find anything to accommodate the wrap 6/22; patient presents for follow-up. She has home health that comes out once [name] week to help with dressing changes. She has no issues or complaints today. We have been using Hydrofera Blue under compression therapy. 7/6; patient presents for follow-up. She states that home health did not come out for the past 2 weeks. It is unclear why. We have been using Hydrofera Blue and Santyl under compression therapy. 7/13; patient presents for follow-up. She did not take the oral antibiotics prescribed at last clinic visit. We have been using Hydrofera Blue with gentamicin/mupirocin ointment under compression therapy. She has no issues or complaints today. She denies signs of infection. 7/20; patient presents for follow-up. We have been using Hydrofera Blue with antibiotic ointment under 3 layer compression. She has home health that change the dressing once. They put collagen on the wound bed. She also reports falling yesterday and hitting her right foot. She has no pain to the area today. 7/27; patient presents for follow-up. We have been using Hydrofera Blue under 3 layer compression. She has home health that changes the dressing. She has no issues or complaints today. 8/3; patient presents for follow-up. We continues to use Hydrofera Blue under 3 layer compression. She has no issues or complaints today. She has been approved for Epicord at 100%. 8/11; patient presents for follow-up. We have been using Hydrofera Blue under 3 layer compression. She has been approved for Epicord and we have this today. She is in agreement with having this applied. 8/18; patient presents for follow-up. Epicord #1 was placed in standard fashion last clinic visit. She has no issues or complaints today. 9/18; Unfortunately patient has missed her last clinic appointments due to falling and breaking her right ankle. We have been following her for her left  ankle Zavaleta, Athziri [name] ([id number]) [id number] Page 7 of 11 wound. She has also developed [name] large blister to the left heel over the past week that has ruptured and dried. She currently resides In [facility]. Wound9/25; patient presents for follow-up. We have been using Hydrofera Blue and Santyl to the original left ankle wound and Xeroform to the dried blistered. We have been wrapping her with compression therapy. She resides in [name] facility and they changed the wrap once this past week. She  has no issues or complaints today. She denies signs of infection. 10/3; patient presents for follow-up. We have been using Xeroform to the heel wound and Epicort to the left ankle wound All under compression therapy. She has no issues or complaints today. 10/10; patient presents for follow-up. We have been using Epicort to the left ankle wound and Santyl and Hydrofera Blue to the heel wound. All under compression therapy. she has no issues or complaints today. 10/16; patient presents for follow-up. Epicort was placed in standard fashion to the superior wound and onto the heel and Santyl and Hydrofera Blue. She states that the wrap got wet during her shower and her facility was able to rewrap with Kerlix/Coban. 10/23; patient presents for follow-up. Epicord was placed to the wound beds last clinic visit. We continue Kerlix/Coban. She has no issues or complaints today. 10/31; Patient presents for follow-up. Epicord was placed to the wound beds last clinic visit. The original wound is almost healed. We have done this under Kerlix/Coban. She denies signs of infection. 11/10; patient presents for follow-up. Patient's last epi cord was placed in standard fashion at last clinic visit. The original wound has healed. She still has [name] heel wound. Grafix was approved however too costly for the patient. We will rerun epi cord to see if she can have more applications. She had done very well  with this. We have been using Hydrofera Blue to the heel under Kerlix/Coban. 11/21; patient presents for follow-up. We have been using Hydrofera Blue and Santyl to the heel under Kerlix/Coban. She switched to [name] new healthcare insurance and again The skin substitute is too costly for the patient. She denies signs of infection. 11/28; patient presents for follow-up. Her facility did not change the wrap last clinic visit due to lack of staffing. The wrap was taken off and Hydrofera Blue dressing has been in place for the past week. She has no issues or complaints today. 12/5; patient presents for follow-up. She states she has had more drainage over the past week. They did not change the wrap at her facility. She states the nurse will be back this week. She is also been doing [name] lot of physical therapy. She has been using the Prevalon boot while in bed. 12/12; patient presents for follow-up. We have been using Hydrofera Blue with antibiotic ointment under 4-layer compression. She is tolerated the wrap well. She states she is using her Prevalon boot while in the wheelchair. She has no issues or complaints today. There is been improvement in wound healing. 12/19; patient presents for follow-up. We have been using Hydrofera Blue and antibiotic ointment to the wound bed under 4-layer compression. Her facility change the wrap once. She states she has been using the Prevalon boot in bed but not in the wheelchair due to issues with transferring. Overall there is still improvement in wound healing. 1/16; patient presents for follow-up. She missed her last clinic appointment. We have been using Hydrofera Blue and antibiotic ointment under 4-layer compression. At the facility this has only been changed twice over the past 3 weeks. They are not using 4-layer compression. She came in with Kerlix/Coban. 1/23; patient presents for follow-up. We have been using Santyl and Hydrofera Blue under 4-layer compression. Patient  has no issues or complaints today. 2/1; patient presents for follow-up. Patient's been using Santyl and Hydrofera Blue to the wound bed. She has developed more slough and now [name] mild odor. She states she is wearing the Prevalon boot at night.  It is unclear if she is offloading this area during the day. 2/13; patient presents for follow-up. Patient's been using Dakin's wet-to-dry dressings. She received her Keystone antibiotic ointment in the mail. She has not used this yet. She reports using her Prevalon boot. She has no issues or complaints today. 2/20; patient presents for follow-up. She has been using Keystone antibiotic ointment with Hydrofera Blue. She has no issues or complaints today. Due to transportation she cannot do the total contact cast today. She is scheduled for next week to have this placed. 2/27; patient presents for follow-up. Plan is for the total contact cast today. We have been using Keystone and Hydrofera Blue to the wound bed. She forgot her Keystone antibiotic ointment. She has no issues or complaints today. 2/29; patient presents for follow-up. Patient presents for her obligatory cast change. She has no issues or complaints today. 3/5; patient presents for follow-up. She has had the cast in place for the past week and has tolerated this well. She has no issues or complaints today. We have been using PolyMem with Keystone antibiotic ointment. 12/15/2022: The wound is quite clean and measured smaller today. 3/19; we have been using Keystone antibiotic ointment with PolyMem silver under the total contact cast. Overall there is been improvement in wound healing. 3/26; patient presents for follow-up. She is been approved for epi fix. She would like to proceed with this today. Previously we have been using Keystone antibiotic ointment with PolyMem silver under the total contact cast. She has no issues or complaints. 01/05/2023: The wound is clean and she is tolerating total contact  casting without difficulty. She had her first application of EpiFix last week. 4/9; patient presents for follow-up. We have been applying EpiFix under the total contact cast for the past 2 weeks. She is tolerated this well. She has no issues or complaints today. Patient History Information obtained from Patient, Chart. Family History Cancer - Father,Mother, Diabetes - Mother,Maternal Grandparents,Paternal Grandparents, Heart Disease - Siblings,Maternal Grandparents, Hypertension - Siblings, Tuberculosis - Maternal Grandparents,Paternal Grandparents. Social History Current every day smoker, Marital Status - Single, Alcohol Use - Never, Drug Use - No History, Caffeine Use - Daily. Medical History Hematologic/Lymphatic Patient has history of Anemia Respiratory Patient has history of Chronic Obstructive Pulmonary Disease (COPD) Cardiovascular Patient has history of Congestive Heart Failure - Weighs self daily, Hypertension, Myocardial Infarction, Peripheral Venous Disease [name], [name] ([id number]) [id number] Page 8 of 11 Endocrine Patient has history of Type II Diabetes Neurologic Patient has history of Neuropathy Medical [name] Surgical History Notes nd Gastrointestinal GERD, Peptic Ulcer Disease Musculoskeletal Broke left leg 3 years ago, 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 Vitals Time Taken: 9:00 AM, Height: 66 in, Weight: 339 lbs, BMI: 54.7, Temperature: 98.4 F, Pulse: 99 bpm, Respiratory Rate: 18 breaths/min, Blood Pressure: 114/68 mmHg, Capillary Blood Glucose: 99 mg/dl. Cardiovascular 2+ dorsalis pedis/posterior tibialis pulses. Psychiatric pleasant and cooperative. General Notes: Open wound with granulation tissue and nonviable tissue at the heel. No signs of surrounding infection. Integumentary (Hair, Skin) Wound #2 status is  Open. Original cause of wound was Blister. The date acquired was: 06/15/2022. The wound has been in treatment 28 weeks. The wound is located on the Left,Distal Calcaneus. The wound measures 2.4cm length x 3cm width x 0.1cm depth; 5.655cm^2 area and 0.565cm^3 volume. There is Fat Layer (Subcutaneous Tissue) exposed. There is no tunneling or undermining noted. There is [name] medium amount of  serosanguineous drainage noted. The wound margin is distinct with the outline attached to the wound base. There is large (67-100%) red, pink, hyper - granulation within the wound bed. There is [name] small (1- 33%) amount of necrotic tissue within the wound bed including Adherent Slough. The periwound skin appearance had no abnormalities noted for texture. The periwound skin appearance had no abnormalities noted for color. The periwound skin appearance exhibited: Maceration. The periwound skin appearance did not exhibit: Dry/Scaly. Periwound temperature was noted as No Abnormality. Assessment Active Problems ICD-10 Non-pressure chronic ulcer of other part of left foot with fat layer exposed Type 2 diabetes mellitus with foot ulcer Morbid (severe) obesity due to excess calories Patient's wound has shown improvement in size and appearance since last clinic visit. I debrided nonviable tissue. EpiFix was placed in standard fashion under the total contact cast. Follow-up in 1 week. Procedures Wound #2 Pre-procedure diagnosis of Wound #2 is [name] Diabetic Wound/Ulcer of the Lower Extremity located on the Left,Distal Calcaneus .Severity of Tissue Pre Debridement is: Fat layer exposed. There was [name] Selective/Open Wound Non-Viable Tissue Debridement with [name] total area of 7.2 sq cm performed by Geralyn Corwin, DO. With the following instrument(s): Curette to remove Non-Viable tissue/material. Material removed includes [facility] after achieving pain control using Lidocaine 5% topical ointment. No specimens were taken. [name] time out was  conducted at 09:20, prior to the start of the procedure. [name] Minimum amount of bleeding was controlled with Pressure. The procedure was tolerated well with [name] pain level of 0 throughout and [name] pain level of 0 following the procedure. Post Debridement Measurements: 2.4cm length x 3cm width x 0.1cm depth; 0.565cm^3 volume. Character of Wound/Ulcer Post Debridement is improved. Severity of Tissue Post Debridement is: Fat layer exposed. Post procedure Diagnosis Wound #2: Same as Pre-Procedure General Notes: Scribed for Dr Mikey Bussing by Redmond Pulling, Rn. Pre-procedure diagnosis of Wound #2 is [name] Diabetic Wound/Ulcer of the Lower Extremity located on the Left,Distal Calcaneus. [name] skin graft procedure using [name] bioengineered skin substitute/cellular or tissue based product was performed by Geralyn Corwin, DO with the following instrument(s): Forceps. Epifix was applied and secured with Steri-Strips. 10.2 sq cm of product was utilized and 0 sq cm was wasted. Post Application, adaptic, gauze was applied. [name] Time Out was conducted at 09:20, prior to the start of the procedure. The procedure was tolerated well with [name] pain level of 0 throughout and [name] pain level of 0 following the procedure. Post procedure Diagnosis Wound #2: Same as Pre-Procedure [name], [name] ([id number]) [id number] Page 9 of 11 General Notes: Scribed for Dr Mikey Bussing by Redmond Pulling, Rn. Pre-procedure diagnosis of Wound #2 is [name] Diabetic Wound/Ulcer of the Lower Extremity located on the Left,Distal Calcaneus . There was [name] T Research scientist (life sciences) otal Procedure by Geralyn Corwin, DO. Post procedure Diagnosis Wound #2: Same as Pre-Procedure Plan Follow-up Appointments: Return Appointment in 1 week. - Dr. Mikey Bussing 01/19/2023 215pm Return Appointment in 2 weeks. - 01/26/23 @ 8:15 Other: - Leave dressing in place. Facility not to change dressing. Anesthetic: (In clinic) Topical Lidocaine 5% applied to wound bed (In clinic) Topical  Lidocaine 4% applied to wound bed Cellular or Tissue Based Products: Cellular or Tissue Based Product Type: - Run IVR for EpiFix and Epicord=100% covered 05/07/22: Epicord ordered 12/08/2022: Run insurance for epicord and theraskin. Cellular or Tissue Based Product applied to wound bed, secured with steri-strips, cover with Adaptic or Mepitel. (DO NOT REMOVE). - Epicord #1 05/15/25 Epicord #2 05/22/22 Epicord #3 06/29/2022  Epicord #4 07/07/2022 Epifix #5 07/14/22 Epicord #6 07/20/22 Epicord # 7 07/27/22 ***Run for Grafix***PENDING Epicord #8 08/04/2022 2024-IVR epifix approved 12/29/2022 #1 epifix applied to left heel. 01/05/2023 #2 epifix applied to left heel. 01/12/2023 #3 epifix applied to left heel Bathing/ Shower/ Hygiene: May shower with protection but do not get wound dressing(s) wet. Protect dressing(s) with water repellant cover (for example, large plastic bag) or [name] cast cover and may then take shower. Edema Control - Lymphedema / SCD / Other: Elevate legs to the level of the heart or above for 30 minutes daily and/or when sitting for 3-4 times [name] day throughout the day. Avoid standing for long periods of time. Off-Loading: T Contact Cast to Left Lower Extremity - size 4 otal Additional Orders / Instructions: Follow Nutritious Diet - Monitor/Control Blood Sugar The following medication(s) was prescribed: lidocaine topical 5 % ointment ointment topical once daily for prior to debridement was prescribed at facility WOUND #2: - Calcaneus Wound Laterality: Left, Distal Cleanser: Soap and Water 1 x Per Week/30 Days Discharge Instructions: May shower and wash wound with dial antibacterial soap and water prior to dressing change. Cleanser: Wound Cleanser 1 x Per Week/30 Days Discharge Instructions: Cleanse the wound with wound cleanser prior to applying [name] clean dressing using gauze sponges, not tissue or cotton balls. Prim Dressing: epifix #2 1 x Per Week/30 Days ary Discharge Instructions: applied by  provider Prim Dressing: adaptic and steri-strips 1 x Per Week/30 Days ary Discharge Instructions: secured the epifix Secondary Dressing: ABD Pad, 8x10 1 x Per Week/30 Days Discharge Instructions: Apply over primary dressing as directed. Secondary Dressing: Woven Gauze Sponge, Non-Sterile 4x4 in (Home Health) 1 x Per Week/30 Days Discharge Instructions: Apply over primary dressing as directed. Secured With: [employer], 4.5x3.1 (in/yd) 1 x Per Week/30 Days Discharge Instructions: Secure with Kerlix as directed. 1. In office sharp debridement 2. EpiFix placed in standard fashion 3. T contact cast placed in standard fashion otal 4. Follow-up in 1 week Electronic Signature(s) Signed: 01/13/2023 4:38:46 PM By: Shawn Stall RN, BSN Signed: 01/14/2023 10:39:43 AM By: Geralyn Corwin DO Previous Signature: 01/12/2023 12:40:48 PM Version By: Geralyn Corwin DO Entered By: Shawn Stall on 01/13/2023 16:32:52 -------------------------------------------------------------------------------- HxROS Details Patient Name: Date of Service: [name] [name], [name]. 01/12/2023 9:00 [name] Medical Record Number: [id number] Patient Account Number: [id number] Date of Birth/Sex: Treating RN: 09-11-1961 (61 y.o. F) Primary Care Provider: [name] Other Clinician: Referring Provider: Treating Provider/Extender: [name]0 Information Obtained From Patient Chart [name], [name] ([id number]) [id number] Page 10 of 11 Hematologic/Lymphatic Medical History: Positive for: Anemia Respiratory Medical History: Positive for: Chronic Obstructive Pulmonary Disease (COPD) Cardiovascular Medical History: Positive for: Congestive Heart Failure - Weighs self daily; Hypertension; Myocardial Infarction; Peripheral Venous Disease Gastrointestinal Medical History: Past Medical History Notes: GERD, Peptic Ulcer Disease Endocrine Medical  History: Positive for: Type II Diabetes Time with diabetes: 16 years Treated with: Insulin, Oral agents Blood sugar tested every day: Yes Tested : Twice daily Musculoskeletal Medical History: Past Medical History Notes: Broke left leg 3 years ago, 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, foot fused Neurologic Medical History: Positive for: Neuropathy Psychiatric Medical History: Past Medical History Notes: Depression Immunizations Pneumococcal Vaccine: Received Pneumococcal Vaccination: No Implantable Devices None Family and Social History Cancer: Yes - Father,Mother; Diabetes: Yes - Mother,Maternal Grandparents,Paternal Grandparents; Heart Disease: Yes - Siblings,Maternal Grandparents; Hypertension: Yes - Siblings; Tuberculosis: Yes - Maternal Grandparents,Paternal Grandparents; Current every day smoker; Marital Status - Single;  Alcohol Use: Never; Drug Use: No History; Caffeine Use: Daily; Financial Concerns: No; Food, Clothing or Shelter Needs: No; Support System Lacking: No; Transportation Concerns: No Electronic Signature(s) Signed: 01/12/2023 12:40:48 PM By: Geralyn Corwin DO Entered By: Geralyn Corwin on 01/12/2023 09:44:33 -------------------------------------------------------------------------------- Total Contact Cast Details Patient Name: Date of Service: [name]. 01/12/2023 9:00 [name] Medical Record Number: [id number] Patient Account Number: [id number] Date of Birth/Sex: Treating RN: Mar 10, 1961 (62 y.o. [name] Primary Care Provider: [name] Other Clinician: Referring Provider: Treating Provider/Extender: [name] in Treatment: [phone] T Contact Cast Applied for Wound Assessment: otal Wound #2 Left,Distal Calcaneus [name], [name] ([id number]) [id number] Page 11 of 11 Performed By: Physician Geralyn Corwin, DO Post Procedure Diagnosis Same as Pre-procedure Electronic Signature(s) Signed:  01/12/2023 12:40:48 PM By: Geralyn Corwin DO Signed: 01/12/2023 3:40:23 PM By: Redmond Pulling RN, BSN Entered By: Redmond Pulling on 01/12/2023 33:00:76 -------------------------------------------------------------------------------- SuperBill Details Patient Name: Date of Service: [name]. 01/12/2023 Medical Record Number: [id number] Patient Account Number: [id number] Date of Birth/Sex: Treating RN: [birth year] (61 y.o. F) Primary Care Provider: [name] Other Clinician: Referring Provider: Treating Provider/Extender: [name] in Treatment: 44 Diagnosis Coding ICD-10 Codes Code Description [phone] Non-pressure chronic ulcer of other part of left foot with fat layer exposed E11.621 Type 2 diabetes mellitus with foot ulcer E66.01 Morbid (severe) obesity due to excess calories Facility Procedures : CPT4 Code: [id number] Description: 15275 - SKIN SUB GRAFT FACE/NK/HF/G ICD-10 Diagnosis Description L97.522 Non-pressure chronic ulcer of other part of left foot with fat layer exposed E11.621 Type 2 diabetes mellitus with foot ulcer Modifier: Quantity: 1 : CPT4 Code: [id number] Description: Q4186 Epifix 4cm x 4.5cm ICD-10 Diagnosis Description L97.522 Non-pressure chronic ulcer of other part of left foot with fat layer exposed E11.621 Type 2 diabetes mellitus with foot ulcer Modifier: Quantity: 11 Physician Procedures : CPT4 Code Description Modifier [id number] 15275 - WC PHYS SKIN SUB GRAFT FACE/NK/HF/G ICD-10 Diagnosis Description L97.522 Non-pressure chronic ulcer of other part of left foot with fat layer exposed E11.621 Type 2 diabetes mellitus with foot ulcer Quantity: 1 Electronic Signature(s) Signed: 01/13/2023 2:31:32 PM By: Pearletha Alfred Signed: 01/14/2023 10:39:43 AM By: Geralyn Corwin DO Previous Signature: 01/12/2023 12:40:48 PM Version By: Geralyn Corwin DO Entered By: Pearletha Alfred on 01/13/2023 14:31:31

## 2023-01-12 NOTE — Progress Notes (Signed)
Hulgan, Shanira [name] ([id number]) [id number] Page 1 of 7 Visit Report for 01/12/2023 Arrival Information Details Patient Name: Date of Service: [name]. 01/12/2023 9:00 A M Medical Record Number: [id number] Patient Account Number: [id number] Date of Birth/Sex: Treating RN: 03-09-1961 (62 y.o. [name]) Palmer, Carrie Primary Care Neasia Fleeman: Junious Dresserampbell, Stephen Other Clinician: Referring Nike Southers: Treating Nikolai Wilczak/Extender: Andree MoroHoffman, Jessica Campbell, Stephen Weeks in Treatment: 7244 Visit Information History Since Last Visit Added or deleted any medications: No Patient Arrived: Wheel Chair Any new allergies or adverse reactions: No Arrival Time: 08:52 Had a fall or experienced change in No Accompanied By: self activities of daily living that may affect Transfer Assistance: None risk of falls: Patient Identification Verified: Yes Signs or symptoms of abuse/neglect since last visito No Secondary Verification Process Completed: Yes Hospitalized since last visit: No Patient Requires Transmission-Based Precautions: No Implantable device outside of the clinic excluding No Patient Has Alerts: No cellular tissue based products placed in the center since last visit: Has Dressing in Place as Prescribed: Yes Has Compression in Place as Prescribed: Yes Has Footwear/Offloading in Place as Prescribed: Yes Left: T Contact Cast otal Pain Present Now: Yes Electronic Signature(s) Signed: 01/12/2023 3:40:23 PM By: Redmond PullingPalmer, Carrie RN, BSN Entered By: Redmond PullingPalmer, Carrie on 01/12/2023 09:00:33 -------------------------------------------------------------------------------- Encounter Discharge Information Details Patient Name: Date of Service: [name]. 01/12/2023 9:00 A M Medical Record Number: [id number] Patient Account Number: [id number] Date of Birth/Sex: Treating RN: 03-09-1961 (62 y.o. [name]) Palmer, Carrie Primary Care Alajia Schmelzer: Junious Dresserampbell, Stephen Other Clinician: Referring  Najah Liverman: Treating Antwanette Wesche/Extender: Andree MoroHoffman, Jessica Campbell, Stephen Weeks in Treatment: 3844 Encounter Discharge Information Items Post Procedure Vitals Discharge Condition: Stable Temperature (F): 98.4 Ambulatory Status: Wheelchair Pulse (bpm): 99 Discharge Destination: Home Respiratory Rate (breaths/min): 18 Transportation: Private Auto Blood Pressure (mmHg): 114/68 Accompanied By: self Schedule Follow-up Appointment: Yes Clinical Summary of Care: Patient Declined Electronic Signature(s) Signed: 01/12/2023 3:40:23 PM By: Redmond PullingPalmer, Carrie RN, BSN Entered By: Redmond PullingPalmer, Carrie on 01/12/2023 10:00:18 -------------------------------------------------------------------------------- Lower Extremity Assessment Details Patient Name: Date of Service: HUFFMA [name]. 01/12/2023 9:00 A M Medical Record Number: [id number] Patient Account Number: [id number] Date of Birth/Sex: Treating RN: 03-09-1961 (62 y.o. [name]) Palmer, Carrie Primary Care Rozalynn Buege: Junious Dresserampbell, Stephen Other Clinician: Referring Jaslynne Dahan: Treating Mariah Harn/Extender: Andree MoroHoffman, Jessica Campbell, Stephen Weeks in Treatment: [address] [place], [place] [name] ([id number]) [id number] Page 2 of 7 Edema Assessment Assessed: [Left: No] [Right: No] Edema: [Left: Ye] [Right: s] Calf Left: Right: Point of Measurement: 28 cm From Medial Instep 48.5 cm Ankle Left: Right: Point of Measurement: 3 cm From Medial Instep 29 cm Vascular Assessment Pulses: Dorsalis Pedis Palpable: [Left:Yes] Electronic Signature(s) Signed: 01/12/2023 3:40:23 PM By: Redmond PullingPalmer, Carrie RN, BSN Entered By: Redmond PullingPalmer, Carrie on 01/12/2023 09:12:18 -------------------------------------------------------------------------------- Multi Wound Chart Details Patient Name: Date of Service: [name]. 01/12/2023 9:00 A M Medical Record Number: [id number] Patient Account Number: [id number] Date of Birth/Sex: Treating RN: 03-09-1961 (62 y.o. F) Primary Care  Saman Giddens: Junious Dresserampbell, Stephen Other Clinician: Referring Hope Holst: Treating Cambry Spampinato/Extender: Andree MoroHoffman, Jessica Campbell, Stephen Weeks in Treatment: 44 Vital Signs Height(in): 66 Capillary Blood Glucose(mg/dl): 99 Weight(lbs): 536339 Pulse(bpm): 99 Body Mass Index(BMI): 54.7 Blood Pressure(mmHg): 114/68 Temperature(F): 98.4 Respiratory Rate(breaths/min): 18 [2:Photos:] [N/A:N/A] Left, Distal Calcaneus N/A N/A Wound Location: Blister N/A N/A Wounding Event: Diabetic Wound/Ulcer of the Lower N/A N/A Primary Etiology: Extremity Anemia, Chronic Obstructive N/A N/A Comorbid History: Pulmonary Disease (COPD), Congestive Heart Failure, Hypertension, Myocardial Infarction, Peripheral Venous Disease, Type II Diabetes, Neuropathy 06/15/2022 N/A N/A Date Acquired:  28 N/A N/A Weeks of Treatment: Open N/A N/A Wound Status: No N/A N/A Wound Recurrence: 2.4x3x0.1 N/A N/A Measurements L x W x D (cm) 5.655 N/A N/A A (cm) : rea 0.565 N/A N/A Volume (cm) : 60.00% N/A N/A % Reduction in A rea: 60.00% N/A N/A % Reduction in Volume: Grade 2 N/A N/A Classification: Medium N/A N/A Exudate A mount: Decarlo, Catheryn [name] ([id number]) [id number] Page 3 of 7 Serosanguineous N/A N/A Exudate Type: red, brown N/A N/A Exudate Color: Distinct, outline attached N/A N/A Wound Margin: Large (67-100%) N/A N/A Granulation Amount: Red, Pink, Hyper-granulation N/A N/A Granulation Quality: Small (1-33%) N/A N/A Necrotic Amount: Fat Layer (Subcutaneous Tissue): Yes N/A N/A Exposed Structures: Fascia: No Tendon: No Muscle: No Joint: No Bone: No Small (1-33%) N/A N/A Epithelialization: Debridement - Selective/Open Wound N/A N/A Debridement: Pre-procedure Verification/Time Out 09:20 N/A N/A Taken: Lidocaine 5% topical ointment N/A N/A Pain Control: Slough N/A N/A Tissue Debrided: Non-Viable Tissue N/A N/A Level: 7.2 N/A N/A Debridement A (sq  cm): rea Curette N/A N/A Instrument: Minimum N/A N/A Bleeding: Pressure N/A N/A Hemostasis A chieved: 0 N/A N/A Procedural Pain: 0 N/A N/A Post Procedural Pain: Procedure was tolerated well N/A N/A Debridement Treatment Response: 2.4x3x0.1 N/A N/A Post Debridement Measurements L x W x D (cm) 0.565 N/A N/A Post Debridement Volume: (cm) No Abnormalities Noted N/A N/A Periwound Skin Texture: Maceration: Yes N/A N/A Periwound Skin Moisture: Dry/Scaly: No No Abnormalities Noted N/A N/A Periwound Skin Color: No Abnormality N/A N/A Temperature: Cellular or Tissue Based Product N/A N/A Procedures Performed: Debridement T Contact Cast otal Treatment Notes Electronic Signature(s) Signed: 01/12/2023 12:40:48 PM By: Geralyn Corwin DO Entered By: Geralyn Corwin on 01/12/2023 09:31:50 -------------------------------------------------------------------------------- Multi-Disciplinary Care Plan Details Patient Name: Date of Service: [name] [name]. 01/12/2023 9:00 A M Medical Record Number: [id number] Patient Account Number: [id number] Date of Birth/Sex: Treating RN: [birth year] (61 y.o. [name] Govern Primary Care Kiron Osmun: [name] Other Clinician: Referring Yudit Modesitt: Treating Breeze Berringer/Extender: [name] in Treatment: 67 Active Inactive Abuse / Safety / Falls / Self Care Management Nursing Diagnoses: History of Falls Goals: Patient will not experience any injury related to falls Date Initiated: 03/05/2022 Target Resolution Date: 02/05/2023 Goal Status: Active Interventions: Assess fall risk on admission and as needed Assess: immobility, friction, shearing, incontinence upon admission and as needed Assess impairment of mobility on admission and as needed per policy Notes: 04/30/22: Fall prevention ongoing, recent fall. Wound/Skin Impairment Vanasten, Joeleen [name] ([id number]) [id number] Page 4 of 7 Nursing  Diagnoses: Impaired tissue integrity Goals: Patient/caregiver will verbalize understanding of skin care regimen Date Initiated: 03/05/2022 Target Resolution Date: 02/06/2023 Goal Status: Active Ulcer/skin breakdown will have a volume reduction of 30% by week 4 Date Initiated: 03/05/2022 Date Inactivated: 04/30/2022 Target Resolution Date: 05/02/2022 Goal Status: Met Ulcer/skin breakdown will have a volume reduction of 50% by week 8 Date Initiated: 04/30/2022 Date Inactivated: 07/07/2022 Target Resolution Date: 07/04/2022 Unmet Reason: patient sent three Goal Status: Unmet weeks inpatient and rehab before returning to wound center. Interventions: Assess patient/caregiver ability to obtain necessary supplies Assess patient/caregiver ability to perform ulcer/skin care regimen upon admission and as needed Assess ulceration(s) every visit Provide education on smoking Provide education on ulcer and skin care Treatment Activities: Topical wound management initiated : 03/05/2022 Notes: 04/30/22: Wound care regimen continues. Electronic Signature(s) Signed: 01/12/2023 3:40:23 PM By: Redmond Pulling RN, BSN Entered By: Redmond Pulling on 01/12/2023 09:22:19 -------------------------------------------------------------------------------- Pain Assessment Details Patient Name: Date of Service: HUFFMA  N, [name]. 01/12/2023 9:00 A M Medical Record Number: [id number] Patient Account Number: [id number] Date of Birth/Sex: Treating RN: 10/29/1960 (62 y.o. [name] Govern Primary Care Fumio Vandam: [name] Other Clinician: Referring Jese Comella: Treating Skyley Grandmaison/Extender: [name] in Treatment: 33 Active Problems Location of Pain Severity and Description of Pain Patient Has Paino Yes Site Locations Duration of the Pain. Constant / Intermittento Intermittent Rate the pain. Current Pain Level: 4 Character of Pain Describe the Pain: Burning Pain Management and  Medication Current Pain Management: Hedgecock, Keisi [name] ([id number]) [id number] Page 5 of 7 Electronic Signature(s) Signed: 01/12/2023 3:40:23 PM By: Redmond Pulling RN, BSN Entered By: Redmond Pulling on 01/12/2023 09:01:21 -------------------------------------------------------------------------------- Patient/Caregiver Education Details Patient Name: Date of Service: [name] 4/9/2024andnbsp9:00 A M Medical Record Number: [id number] Patient Account Number: [id number] Date of Birth/Gender: Treating RN: 1961/07/29 (62 y.o. [name] Govern Primary Care Physician: [name] Other Clinician: Referring Physician: Treating Physician/Extender: [name] in Treatment: 36 Education Assessment Education Provided To: Patient Education Topics Provided Wound/Skin Impairment: Methods: Explain/Verbal Responses: State content correctly [employer]) Signed: 01/12/2023 3:40:23 PM By: Redmond Pulling RN, BSN Entered By: Redmond Pulling on 01/12/2023 09:22:56 -------------------------------------------------------------------------------- Wound Assessment Details Patient Name: Date of Service: [name] [name]. 01/12/2023 9:00 A M Medical Record Number: [id number] Patient Account Number: [id number] Date of Birth/Sex: Treating RN: [birth year] (61 y.o. [name] Govern Primary Care Lavaughn Bisig: [name] Other Clinician: Referring Seena Face: Treating Minnah Llamas/Extender: [name] in Treatment: 44 Wound Status Wound Number: 2 Primary Diabetic Wound/Ulcer of the Lower Extremity Etiology: Wound Location: Left, Distal Calcaneus Wound Open Wounding Event: Blister Status: Date Acquired: 06/15/2022 Comorbid Anemia, Chronic Obstructive Pulmonary Disease (COPD), Weeks Of Treatment: 28 History: Congestive Heart Failure, Hypertension, Myocardial Infarction, Clustered Wound: No Peripheral Venous  Disease, Type II Diabetes, Neuropathy Photos Wound Measurements Length: (cm) 2.4 Width: (cm) 3 Depth: (cm) 0.1 Area: (cm) 5.6 Hulme, [name] ([id number]) Volume: (cm) % Reduction in Area: 60% % Reduction in Volume: 60% Epithelialization: Small (1-33%) 55 Tunneling: No [id number] Page 6 of 7 0.565 Undermining: No Wound Description Classification: Grade 2 Wound Margin: Distinct, outline attached Exudate Amount: Medium Exudate Type: Serosanguineous Exudate Color: red, brown Foul Odor After Cleansing: No Slough/Fibrino Yes Wound Bed Granulation Amount: Large (67-100%) Exposed Structure Granulation Quality: Red, Pink, Hyper-granulation Fascia Exposed: No Necrotic Amount: Small (1-33%) Fat Layer (Subcutaneous Tissue) Exposed: Yes Necrotic Quality: Adherent Slough Tendon Exposed: No Muscle Exposed: No Joint Exposed: No Bone Exposed: No Periwound Skin Texture Texture Color No Abnormalities Noted: Yes No Abnormalities Noted: Yes Moisture Temperature / Pain No Abnormalities Noted: No Temperature: No Abnormality Dry / Scaly: No Maceration: Yes Treatment Notes Wound #2 (Calcaneus) Wound Laterality: Left, Distal Cleanser Soap and Water Discharge Instruction: May shower and wash wound with dial antibacterial soap and water prior to dressing change. Wound Cleanser Discharge Instruction: Cleanse the wound with wound cleanser prior to applying a clean dressing using gauze sponges, not tissue or cotton balls. Peri-Wound Care Topical Primary Dressing epifix #2 Discharge Instruction: applied by Harvest Stanco adaptic and steri-strips Discharge Instruction: secured the epifix Secondary Dressing ABD Pad, 8x10 Discharge Instruction: Apply over primary dressing as directed. Woven Gauze Sponge, Non-Sterile 4x4 in Discharge Instruction: Apply over primary dressing as directed. Secured With [employer], 4.5x3.1 (in/yd) Discharge Instruction: Secure  with Kerlix as directed. Compression Wrap Compression Stockings Add-Ons Electronic Signature(s) Signed: 01/12/2023 3:40:23 PM By: Redmond Pulling RN, BSN Entered By: Rubye Oaks,  Carrie on 01/12/2023 09:14:46 -------------------------------------------------------------------------------- Vitals Details Patient Name: Date of Service: [name] [name]. 01/12/2023 9:00 A M Medical Record Number: [id number] Patient Account Number: [id number] KOLLIN, Athea [name] ([id number]) [id number].pdf Page 7 of 7 Date of Birth/Sex: Treating RN: 12/07/1960 (61 y.o. [name] Govern Primary Care Sinahi Knights: Other Clinician: [name] Referring Yannely Kintzel: Treating Eliana Lueth/Extender: [name] in Treatment: 44 Vital Signs Time Taken: 09:00 Temperature (F): 98.4 Height (in): 66 Pulse (bpm): 99 Weight (lbs): 339 Respiratory Rate (breaths/min): 18 Body Mass Index (BMI): 54.7 Blood Pressure (mmHg): 114/68 Capillary Blood Glucose (mg/dl): 99 Reference Range: 80 - 120 mg / dl Electronic Signature(s) Signed: 01/12/2023 3:40:23 PM By: Redmond Pulling RN, BSN Entered By: Redmond Pulling on 01/12/2023 09:01:03

## 2023-01-19 ENCOUNTER — Encounter (HOSPITAL_BASED_OUTPATIENT_CLINIC_OR_DEPARTMENT_OTHER): Payer: 59 | Admitting: Internal Medicine

## 2023-01-19 DIAGNOSIS — L97522 Non-pressure chronic ulcer of other part of left foot with fat layer exposed: Secondary | ICD-10-CM

## 2023-01-19 DIAGNOSIS — E11621 Type 2 diabetes mellitus with foot ulcer: Secondary | ICD-10-CM | POA: Diagnosis not present

## 2023-01-20 NOTE — Progress Notes (Signed)
[name], [name] ([id number]) [id number] Page 1 of 7 Visit Report for 01/19/2023 Arrival Information Details Patient Name: Date of Service: [name] 01/19/2023 2:15 PM Medical Record Number: [id number] Patient Account Number: [id number] Date of Birth/Sex: Treating RN: 02-Oct-1961 (62 y.o. [name], [name] Primary Care Haylo Fake: [name] Other Clinician: Referring Guilford Shannahan: Treating Jeanee Fabre/Extender: [name] in Treatment: 45 Visit Information History Since Last Visit Added or deleted any medications: No Patient Arrived: Wheel Chair Any new allergies or adverse reactions: No Arrival Time: 14:05 Had a fall or experienced change in No Accompanied By: self activities of daily living that may affect Transfer Assistance: Manual risk of falls: Patient Identification Verified: Yes Signs or symptoms of abuse/neglect since last visito No Secondary Verification Process Completed: Yes Hospitalized since last visit: No Patient Requires Transmission-Based Precautions: No Implantable device outside of the clinic excluding No Patient Has Alerts: No cellular tissue based products placed in the center since last visit: Has Dressing in Place as Prescribed: Yes Has Footwear/Offloading in Place as Prescribed: Yes Left: T Contact Cast otal Pain Present Now: No Electronic Signature(s) Signed: 01/19/2023 4:54:11 PM By: Shawn Stall RN, BSN Entered By: Shawn Stall on 01/19/2023 14:09:58 -------------------------------------------------------------------------------- Encounter Discharge Information Details Patient Name: Date of Service: [name]. 01/19/2023 2:15 PM Medical Record Number: [id number] Patient Account Number: [id number] Date of Birth/Sex: Treating RN: 1960-11-25 (61 y.o. [name] Primary Care Parke Jandreau: [name] Other Clinician: Referring Lanice Folden: Treating Kylani Wires/Extender: [name] in Treatment: 45 Encounter Discharge Information Items Post Procedure Vitals Discharge Condition: Stable Temperature (F): 98.2 Ambulatory Status: Wheelchair Pulse (bpm): 83 Discharge Destination: Home Respiratory Rate (breaths/min): 20 Transportation: Private Auto Blood Pressure (mmHg): 137/75 Accompanied By: self Schedule Follow-up Appointment: Yes Clinical Summary of Care: Electronic Signature(s) Signed: 01/19/2023 4:54:11 PM By: Shawn Stall RN, BSN Entered By: Shawn Stall on 01/19/2023 14:34:54 -------------------------------------------------------------------------------- Lower Extremity Assessment Details Patient Name: Date of Service: [name]. 01/19/2023 2:15 PM Medical Record Number: [id number] Patient Account Number: [id number] Date of Birth/Sex: Treating RN: [birth year] (61 y.o. [name], [name] Primary Care Dezeray Puccio: [name] Other Clinician: Referring Dorlene Footman: Treating Prentiss Hammett/Extender: [name] in Treatment: 45 Edema Assessment Left: [Left: Right] [name]: :] Assessed: [Left: Yes] [Right: No] Edema: [Left: Ye] [Right: s] Calf Left: Right: Point of Measurement: 28 cm From Medial Instep 49 cm Ankle Left: Right: Point of Measurement: 3 cm From Medial Instep 29 cm Vascular Assessment Pulses: Dorsalis Pedis Palpable: [Left:Yes] Electronic Signature(s) Signed: 01/19/2023 4:54:11 PM By: Shawn Stall RN, BSN Entered By: Shawn Stall on 01/19/2023 14:17:19 -------------------------------------------------------------------------------- Multi Wound Chart Details Patient Name: Date of Service: [name]. 01/19/2023 2:15 PM Medical Record Number: [id number] Patient Account Number: [id number] Date of Birth/Sex: Treating RN: [birth year] (61 y.o. F) Primary Care Hulen Mandler: [name] Other Clinician: Referring Alecia Doi: Treating Arah Aro/Extender: [name] in Treatment: 45 Vital Signs Height(in): 66 Capillary Blood Glucose(mg/dl): 259 Weight(lbs): 563 Pulse(bpm): 93 Body Mass Index(BMI): 54.7 Blood Pressure(mmHg): 137/75 Temperature(F): 98.2 Respiratory Rate(breaths/min): 20 [2:Photos:] [N/A:N/A] Left, Distal Calcaneus N/A N/A Wound Location: Blister N/A N/A Wounding Event: Diabetic Wound/Ulcer of the Lower N/A N/A Primary Etiology: Extremity Anemia, Chronic Obstructive N/A N/A Comorbid History: Pulmonary Disease (COPD), Congestive Heart Failure, Hypertension, Myocardial Infarction, Peripheral Venous Disease, Type II Diabetes, Neuropathy 06/15/2022 N/A N/A Date Acquired: 49 N/A N/A Weeks of Treatment: Open N/A N/A Wound Status: No N/A N/A Wound Recurrence: 2x2.8x0.1  N/A N/A Measurements L x W x D (cm) 4.398 N/A N/A A (cm) : rea 0.44 N/A N/A Volume (cm) : 68.90% N/A N/A % Reduction in A rea: 68.90% N/A N/A % Reduction in Volume: Grade 2 N/A N/A Classification: Medium N/A N/A Exudate A mount: Serosanguineous N/A N/A Exudate Type: red, brown N/A N/A Exudate Color: [name] ([id number]) [id number] Page 3 of 7 Distinct, outline attached N/A N/A Wound Margin: Large (67-100%) N/A N/A Granulation Amount: Red, Pink, Hyper-granulation N/A N/A Granulation Quality: Small (1-33%) N/A N/A Necrotic Amount: Fat Layer (Subcutaneous Tissue): Yes N/A N/A Exposed Structures: Fascia: No Tendon: No Muscle: No Joint: No Bone: No Medium (34-66%) N/A N/A Epithelialization: Debridement - Excisional N/A N/A Debridement: Pre-procedure Verification/Time Out 14:20 N/A N/A Taken: Lidocaine 4% T opical Solution N/A N/A Pain Control: Subcutaneous, Slough N/A N/A Tissue Debrided: Skin/Subcutaneous Tissue N/A N/A Level: 5.6 N/A N/A Debridement A (sq cm): rea Curette N/A N/A Instrument: Minimum N/A N/A Bleeding: Silver Nitrate N/A N/A Hemostasis A  chieved: 0 N/A N/A Procedural Pain: 0 N/A N/A Post Procedural Pain: Procedure was tolerated well N/A N/A Debridement Treatment Response: 2x2.8x0.1 N/A N/A Post Debridement Measurements L x W x D (cm) 0.44 N/A N/A Post Debridement Volume: (cm) No Abnormalities Noted N/A N/A Periwound Skin Texture: Maceration: No N/A N/A Periwound Skin Moisture: Dry/Scaly: No No Abnormalities Noted N/A N/A Periwound Skin Color: No Abnormality N/A N/A Temperature: Cellular or Tissue Based Product N/A N/A Procedures Performed: Debridement T Contact Cast otal Treatment Notes Wound #2 (Calcaneus) Wound Laterality: Left, Distal Cleanser Soap and Water Discharge Instruction: May shower and wash wound with dial antibacterial soap and water prior to dressing change. Wound Cleanser Discharge Instruction: Cleanse the wound with wound cleanser prior to applying a clean dressing using gauze sponges, not tissue or cotton balls. Peri-Wound Care Topical Primary Dressing epifix #4 Discharge Instruction: applied by Keierra Nudo adaptic and steri-strips Discharge Instruction: secured the epifix Secondary Dressing ABD Pad, 8x10 Discharge Instruction: Apply over primary dressing as directed. Woven Gauze Sponge, Non-Sterile 4x4 in Discharge Instruction: Apply over primary dressing as directed. Secured With [employer], 4.5x3.1 (in/yd) Discharge Instruction: Secure with Kerlix as directed. Compression Wrap Compression Stockings Add-Ons Electronic Signature(s) Signed: 01/19/2023 3:28:22 PM By: Geralyn Corwin DO Entered By: Geralyn Corwin on 01/19/2023 15:16:08 Herrle, Jaylie [name] ([id number]) [id number] Page 4 of 7 -------------------------------------------------------------------------------- Multi-Disciplinary Care Plan Details Patient Name: Date of Service: [name]. 01/19/2023 2:15 PM Medical Record Number: [id number] Patient Account Number: [id number] Date  of Birth/Sex: Treating RN: [birth year] (62 y.o. [name], [name] Primary Care Jonice Cerra: [name] Other Clinician: Referring Mazal Ebey: Treating Kiyoko Mcguirt/Extender: [name] in Treatment: 45 Active Inactive Abuse / Safety / Falls / Self Care Management Nursing Diagnoses: History of Falls Goals: Patient will not experience any injury related to falls Date Initiated: 03/05/2022 Target Resolution Date: 02/05/2023 Goal Status: Active Interventions: Assess fall risk on admission and as needed Assess: immobility, friction, shearing, incontinence upon admission and as needed Assess impairment of mobility on admission and as needed per policy Notes: 04/30/22: Fall prevention ongoing, recent fall. Wound/Skin Impairment Nursing Diagnoses: Impaired tissue integrity Goals: Patient/caregiver will verbalize understanding of skin care regimen Date Initiated: 03/05/2022 Target Resolution Date: 02/06/2023 Goal Status: Active Ulcer/skin breakdown will have a volume reduction of 30% by week 4 Date Initiated: 03/05/2022 Date Inactivated: 04/30/2022 Target Resolution Date: 05/02/2022 Goal Status: Met Ulcer/skin breakdown will have a volume reduction of 50% by week 8 Date Initiated: 04/30/2022  Date Inactivated: 07/07/2022 Target Resolution Date: 07/04/2022 Unmet Reason: patient sent three Goal Status: Unmet weeks inpatient and rehab before returning to wound center. Interventions: Assess patient/caregiver ability to obtain necessary supplies Assess patient/caregiver ability to perform ulcer/skin care regimen upon admission and as needed Assess ulceration(s) every visit Provide education on smoking Provide education on ulcer and skin care Treatment Activities: Topical wound management initiated : 03/05/2022 Notes: 04/30/22: Wound care regimen continues. Electronic Signature(s) Signed: 01/19/2023 4:54:11 PM By: Shawn Stall RN, BSN Entered By: Shawn Stall on  01/19/2023 14:25:29 -------------------------------------------------------------------------------- Pain Assessment Details Patient Name: Date of Service: [name]. 01/19/2023 2:15 PM Medical Record Number: [id number] Patient Account Number: [id number] Date of Birth/Sex: Treating RN: Oct 28, 1960 (61 y.o. [name] Primary Care Ardian Haberland: [name] Other Clinician: Lou Miner, Andi [name] ([id number]) [id number] Page 5 of 7 Referring Aubert Choyce: Treating Doy Taaffe/Extender: [name] in Treatment: 45 Active Problems Location of Pain Severity and Description of Pain Patient Has Paino No Site Locations Pain Management and Medication Current Pain Management: Electronic Signature(s) Signed: 01/19/2023 4:54:11 PM By: Shawn Stall RN, BSN Entered By: Shawn Stall on 01/19/2023 14:10:23 -------------------------------------------------------------------------------- Patient/Caregiver Education Details Patient Name: Date of Service: [name] 4/16/2024andnbsp2:15 PM Medical Record Number: [id number] Patient Account Number: [id number] Date of Birth/Gender: Treating RN: March 06, 1961 (62 y.o. [name] Primary Care Physician: [name] Other Clinician: Referring Physician: Treating Physician/Extender: [name] in Treatment: 45 Education Assessment Education Provided To: Patient Education Topics Provided Wound/Skin Impairment: Handouts: Caring for Your Ulcer Methods: Explain/Verbal Responses: Reinforcements needed Electronic Signature(s) Signed: 01/19/2023 4:54:11 PM By: Shawn Stall RN, BSN Entered By: Shawn Stall on 01/19/2023 14:25:42 -------------------------------------------------------------------------------- Wound Assessment Details Patient Name: Date of Service: [name]. 01/19/2023 2:15 PM Medical Record Number: [id number] Patient Account  Number: [id number] Date of Birth/Sex: Treating RN: [birth year] (61 y.o. [name], [name] Primary Care Alaney Witter: [name] Other Clinician: Referring Joel Mericle: Treating Verbie Babic/Extender: Jhonnie Garner Mel[place], [place] [name] ([id number]) [id number] Page 6 of 7 Weeks in Treatment: 45 Wound Status Wound Number: 2 Primary Diabetic Wound/Ulcer of the Lower Extremity Etiology: Wound Location: Left, Distal Calcaneus Wound Open Wounding Event: Blister Status: Date Acquired: 06/15/2022 Comorbid Anemia, Chronic Obstructive Pulmonary Disease (COPD), Weeks Of Treatment: 29 History: Congestive Heart Failure, Hypertension, Myocardial Infarction, Clustered Wound: No Peripheral Venous Disease, Type II Diabetes, Neuropathy Photos Wound Measurements Length: (cm) 2 Width: (cm) 2.8 Depth: (cm) 0.1 Area: (cm) 4.398 Volume: (cm) 0.44 % Reduction in Area: 68.9% % Reduction in Volume: 68.9% Epithelialization: Medium (34-66%) Tunneling: No Undermining: No Wound Description Classification: Grade 2 Wound Margin: Distinct, outline attached Exudate Amount: Medium Exudate Type: Serosanguineous Exudate Color: red, brown Foul Odor After Cleansing: No Slough/Fibrino Yes Wound Bed Granulation Amount: Large (67-100%) Exposed Structure Granulation Quality: Red, Pink, Hyper-granulation Fascia Exposed: No Necrotic Amount: Small (1-33%) Fat Layer (Subcutaneous Tissue) Exposed: Yes Necrotic Quality: Adherent Slough Tendon Exposed: No Muscle Exposed: No Joint Exposed: No Bone Exposed: No Periwound Skin Texture Texture Color No Abnormalities Noted: Yes No Abnormalities Noted: Yes Moisture Temperature / Pain No Abnormalities Noted: No Temperature: No Abnormality Dry / Scaly: No Maceration: No Treatment Notes Wound #2 (Calcaneus) Wound Laterality: Left, Distal Cleanser Soap and Water Discharge Instruction: May shower and wash wound with dial  antibacterial soap and water prior to dressing change. Wound Cleanser Discharge Instruction: Cleanse the wound with wound cleanser prior to applying a clean dressing using gauze sponges, not tissue or cotton balls.  Peri-Wound Care Topical Primary Dressing epifix #4 Discharge Instruction: applied by Ahnaf Caponi Hartel, Marillyn [name] ([id number]) [id number] Page 7 of 7 adaptic and steri-strips Discharge Instruction: secured the epifix Secondary Dressing ABD Pad, 8x10 Discharge Instruction: Apply over primary dressing as directed. Woven Gauze Sponge, Non-Sterile 4x4 in Discharge Instruction: Apply over primary dressing as directed. Secured With [employer], 4.5x3.1 (in/yd) Discharge Instruction: Secure with Kerlix as directed. Compression Wrap Compression Stockings Add-Ons Electronic Signature(s) Signed: 01/19/2023 4:54:11 PM By: Shawn Stall RN, BSN Entered By: Shawn Stall on 01/19/2023 14:18:05 -------------------------------------------------------------------------------- Vitals Details Patient Name: Date of Service: [name]. 01/19/2023 2:15 PM Medical Record Number: [id number] Patient Account Number: [id number] Date of Birth/Sex: Treating RN: [birth year] (61 y.o. [name], [name] Primary Care Lorelee Mclaurin: [name] Other Clinician: Referring Yanni Quiroa: Treating Reyaansh Merlo/Extender: [name] in Treatment: 45 Vital Signs Time Taken: 14:10 Temperature (F): 98.2 Height (in): 66 Pulse (bpm): 93 Weight (lbs): 339 Respiratory Rate (breaths/min): 20 Body Mass Index (BMI): 54.7 Blood Pressure (mmHg): 137/75 Capillary Blood Glucose (mg/dl): 644 Reference Range: 80 - 120 mg / dl Electronic Signature(s) Signed: 01/19/2023 4:54:11 PM By: Shawn Stall RN, BSN Entered By: Shawn Stall on 01/19/2023 14:10:19

## 2023-01-20 NOTE — Progress Notes (Signed)
Matsushima, Nhung C ([id number]) [id number].pdf Page 1 of 11 Visit Report for 01/19/2023 Chief Complaint Document Details Patient Name: Date of Service: [name] 01/19/2023 2:15 PM Medical Record Number: [id number] Patient Account Number: [id number] Date of Birth/Sex: Treating RN: [birth year] (62 y.o. F) Primary Care Provider: [name] Other Clinician: Referring Provider: Treating Provider/Extender: [name] in Treatment: 45 Information Obtained from: Patient Chief Complaint 03/05/2022; left foot wound Electronic Signature(s) Signed: 01/19/2023 3:28:22 PM By: Geralyn Corwin DO Entered By: Geralyn Corwin on 01/19/2023 15:16:14 -------------------------------------------------------------------------------- Cellular or Tissue Based Product Details Patient Name: Date of Service: HUFFMA [name] C. 01/19/2023 2:15 PM Medical Record Number: [id number] Patient Account Number: [id number] Date of Birth/Sex: Treating RN: 1961/01/15 (61 y.o. [name], [name] Primary Care Provider: [name] Other Clinician: Referring Provider: Treating Provider/Extender: [name] in Treatment: 45 Cellular or Tissue Based Product Type Wound #2 Left,Distal Calcaneus Applied to: Performed By: Physician Geralyn Corwin, DO Cellular or Tissue Based Product Type: Epifix Level of Consciousness (Pre-procedure): Awake and Alert Pre-procedure Verification/Time Out Yes - 14:30 Taken: Location: genitalia / hands / feet / multiple digits Wound Size (sq cm): 5.6 Product Size (sq cm): 11 Waste Size (sq cm): 0 Amount of Product Applied (sq cm): 11 Instrument Used: Forceps, Scissors Lot #: [id number] Order #: 4 Expiration Date: 09/05/2027 Fenestrated: No Reconstituted: Yes Solution Type: normal saline Solution Amount: 3mL Lot #: [id number] Solution Expiration Date: 03/04/2025 Secured: Yes Secured With:  Steri-Strips, adaptic Dressing Applied: No Procedural Pain: 0 Post Procedural Pain: 0 Response to Treatment: Procedure was tolerated well Level of Consciousness (Post- Awake and Alert procedure): Post Procedure Diagnosis Same as Pre-procedure Electronic Signature(s) Signed: 01/19/2023 3:28:22 PM By: Geralyn Corwin DO Signed: 01/19/2023 4:54:11 PM By: Shawn Stall RN, BSN Entered By: Shawn Stall on 01/19/2023 14:33:36 Medders, Garrett C ([id number]) [id number] Page 2 of 11 -------------------------------------------------------------------------------- Debridement Details Patient Name: Date of Service: [name] Bushy C. 01/19/2023 2:15 PM Medical Record Number: [id number] Patient Account Number: [id number] Date of Birth/Sex: Treating RN: [birth year] (62 y.o. [name], [name] Primary Care Provider: [name] Other Clinician: Referring Provider: Treating Provider/Extender: [name] in Treatment: 45 Debridement Performed for Assessment: Wound #2 Left,Distal Calcaneus Performed By: Physician Geralyn Corwin, DO Debridement Type: Debridement Severity of Tissue Pre Debridement: Fat layer exposed Level of Consciousness (Pre-procedure): Awake and Alert Pre-procedure Verification/Time Out Yes - 14:20 Taken: Start Time: 14:21 Pain Control: Lidocaine 4% T opical Solution T Area Debrided (L x W): otal 2 (cm) x 2.8 (cm) = 5.6 (cm) Tissue and other material debrided: Viable, Non-Viable, Slough, Subcutaneous, Slough, Hyper-granulation Level: Skin/Subcutaneous Tissue Debridement Description: Excisional Instrument: Curette Bleeding: Minimum Hemostasis Achieved: Silver Nitrate End Time: 14:29 Procedural Pain: 0 Post Procedural Pain: 0 Response to Treatment: Procedure was tolerated well Level of Consciousness (Post- Awake and Alert procedure): Post Debridement Measurements of Total Wound Length: (cm) 2 Width: (cm)  2.8 Depth: (cm) 0.1 Volume: (cm) 0.44 Character of Wound/Ulcer Post Debridement: Improved Severity of Tissue Post Debridement: Fat layer exposed Post Procedure Diagnosis Same as Pre-procedure Electronic Signature(s) Signed: 01/19/2023 3:28:22 PM By: Geralyn Corwin DO Signed: 01/19/2023 4:54:11 PM By: Shawn Stall RN, BSN Entered By: Shawn Stall on 01/19/2023 14:33:45 -------------------------------------------------------------------------------- HPI Details Patient Name: Date of Service: [name], [name]. 01/19/2023 2:15 PM Medical Record Number: [id number] Patient Account Number: [id number] Date of Birth/Sex: Treating RN: 1961-07-26 (61 y.o. F) Primary Care Provider: [name] Other Clinician: Referring Provider:  Treating Provider/Extender: [name] in Treatment: 45 History of Present Illness HPI Description: Admission 03/05/2022 [name] is a 62 year old female with a past medical history of uncontrolled insulin-dependent type 2 diabetes, tobacco user and chronic diastolic heart failure that presents to the clinic for a 47-month history of wound to her left heel. She states she had a left ankle fusion in September 2022. She states that she always had a wound after the surgery and it never healed. She has home health and they have been doing compression wraps along with silver alginate to the wound bed. She currently denies signs of infection. 6/8; this is a 62 year old woman with type 2 diabetes. She developed a wound on her left Achilles heel just above the tip of the heel in the setting of recurrent ankle surgeries in late 2022. I have seen some of these results from either the cast or the surgical boots that are put on after these operations. She thinks this may be the case. And a sense of pressure ulcer. In any case that she has been using Santyl Hydrofera Blue under compression. She is not wearing any footwear at home as she cannot  find anything to accommodate the wrap Wayment, Dayne C ([id number]) [phone].pdf Page 3 of 11 6/22; patient presents for follow-up. She has home health that comes out once a week to help with dressing changes. She has no issues or complaints today. We have been using Hydrofera Blue under compression therapy. 7/6; patient presents for follow-up. She states that home health did not come out for the past 2 weeks. It is unclear why. We have been using Hydrofera Blue and Santyl under compression therapy. 7/13; patient presents for follow-up. She did not take the oral antibiotics prescribed at last clinic visit. We have been using Hydrofera Blue with gentamicin/mupirocin ointment under compression therapy. She has no issues or complaints today. She denies signs of infection. 7/20; patient presents for follow-up. We have been using Hydrofera Blue with antibiotic ointment under 3 layer compression. She has home health that change the dressing once. They put collagen on the wound bed. She also reports falling yesterday and hitting her right foot. She has no pain to the area today. 7/27; patient presents for follow-up. We have been using Hydrofera Blue under 3 layer compression. She has home health that changes the dressing. She has no issues or complaints today. 8/3; patient presents for follow-up. We continues to use Hydrofera Blue under 3 layer compression. She has no issues or complaints today. She has been approved for Epicord at 100%. 8/11; patient presents for follow-up. We have been using Hydrofera Blue under 3 layer compression. She has been approved for Epicord and we have this today. She is in agreement with having this applied. 8/18; patient presents for follow-up. Epicord #1 was placed in standard fashion last clinic visit. She has no issues or complaints today. 9/18; Unfortunately patient has missed her last clinic appointments due to falling and breaking her right ankle.  We have been following her for her left ankle wound. She has also developed a large blister to the left heel over the past week that has ruptured and dried. She currently resides In [facility]. Wound9/25; patient presents for follow-up. We have been using Hydrofera Blue and Santyl to the original left ankle wound and Xeroform to the dried blistered. We have been wrapping her with compression therapy. She resides in a facility and they changed the wrap once this past week.  She has no issues or complaints today. She denies signs of infection. 10/3; patient presents for follow-up. We have been using Xeroform to the heel wound and Epicort to the left ankle wound All under compression therapy. She has no issues or complaints today. 10/10; patient presents for follow-up. We have been using Epicort to the left ankle wound and Santyl and Hydrofera Blue to the heel wound. All under compression therapy. she has no issues or complaints today. 10/16; patient presents for follow-up. Epicort was placed in standard fashion to the superior wound and onto the heel and Santyl and Hydrofera Blue. She states that the wrap got wet during her shower and her facility was able to rewrap with Kerlix/Coban. 10/23; patient presents for follow-up. Epicord was placed to the wound beds last clinic visit. We continue Kerlix/Coban. She has no issues or complaints today. 10/31; Patient presents for follow-up. Epicord was placed to the wound beds last clinic visit. The original wound is almost healed. We have done this under Kerlix/Coban. She denies signs of infection. 11/10; patient presents for follow-up. Patient's last epi cord was placed in standard fashion at last clinic visit. The original wound has healed. She still has a heel wound. Grafix was approved however too costly for the patient. We will rerun epi cord to see if she can have more applications. She had done very well with this. We have been using Hydrofera Blue to  the heel under Kerlix/Coban. 11/21; patient presents for follow-up. We have been using Hydrofera Blue and Santyl to the heel under Kerlix/Coban. She switched to a new healthcare insurance and again The skin substitute is too costly for the patient. She denies signs of infection. 11/28; patient presents for follow-up. Her facility did not change the wrap last clinic visit due to lack of staffing. The wrap was taken off and Hydrofera Blue dressing has been in place for the past week. She has no issues or complaints today. 12/5; patient presents for follow-up. She states she has had more drainage over the past week. They did not change the wrap at her facility. She states the nurse will be back this week. She is also been doing a lot of physical therapy. She has been using the Prevalon boot while in bed. 12/12; patient presents for follow-up. We have been using Hydrofera Blue with antibiotic ointment under 4-layer compression. She is tolerated the wrap well. She states she is using her Prevalon boot while in the wheelchair. She has no issues or complaints today. There is been improvement in wound healing. 12/19; patient presents for follow-up. We have been using Hydrofera Blue and antibiotic ointment to the wound bed under 4-layer compression. Her facility change the wrap once. She states she has been using the Prevalon boot in bed but not in the wheelchair due to issues with transferring. Overall there is still improvement in wound healing. 1/16; patient presents for follow-up. She missed her last clinic appointment. We have been using Hydrofera Blue and antibiotic ointment under 4-layer compression. At the facility this has only been changed twice over the past 3 weeks. They are not using 4-layer compression. She came in with Kerlix/Coban. 1/23; patient presents for follow-up. We have been using Santyl and Hydrofera Blue under 4-layer compression. Patient has no issues or complaints today. 2/1; patient  presents for follow-up. Patient's been using Santyl and Hydrofera Blue to the wound bed. She has developed more slough and now a mild odor. She states she is wearing the Prevalon boot at  night. It is unclear if she is offloading this area during the day. 2/13; patient presents for follow-up. Patient's been using Dakin's wet-to-dry dressings. She received her Keystone antibiotic ointment in the mail. She has not used this yet. She reports using her Prevalon boot. She has no issues or complaints today. 2/20; patient presents for follow-up. She has been using Keystone antibiotic ointment with Hydrofera Blue. She has no issues or complaints today. Due to transportation she cannot do the total contact cast today. She is scheduled for next week to have this placed. 2/27; patient presents for follow-up. Plan is for the total contact cast today. We have been using Keystone and Hydrofera Blue to the wound bed. She forgot her Keystone antibiotic ointment. She has no issues or complaints today. 2/29; patient presents for follow-up. Patient presents for her obligatory cast change. She has no issues or complaints today. 3/5; patient presents for follow-up. She has had the cast in place for the past week and has tolerated this well. She has no issues or complaints today. We have been using PolyMem with Keystone antibiotic ointment. 12/15/2022: The wound is quite clean and measured smaller today. 3/19; we have been using Keystone antibiotic ointment with PolyMem silver under the total contact cast. Overall there is been improvement in wound healing. 3/26; patient presents for follow-up. She is been approved for epi fix. She would like to proceed with this today. Previously we have been using [facility], Etty C ([id number]) [phone].pdf Page 4 of 11 antibiotic ointment with PolyMem silver under the total contact cast. She has no issues or complaints. 01/05/2023: The wound is clean and she  is tolerating total contact casting without difficulty. She had her first application of EpiFix last week. 4/9; patient presents for follow-up. We have been applying EpiFix under the total contact cast for the past 2 weeks. She is tolerated this well. She has no issues or complaints today. 4/16; patient presents for follow-up. We have been using EpiFix under the total contact cast. She has done well with this. Wound is smaller. She does have slough buildup. Electronic Signature(s) Signed: 01/19/2023 3:28:22 PM By: Geralyn Corwin DO Entered By: Geralyn Corwin on 01/19/2023 15:16:36 -------------------------------------------------------------------------------- Physical Exam Details Patient Name: Date of Service: Sharol Harness MY C. 01/19/2023 2:15 PM Medical Record Number: [id number] Patient Account Number: [id number] Date of Birth/Sex: Treating RN: 28-Jan-1961 (61 y.o. F) Primary Care Provider: [name] Other Clinician: Referring Provider: Treating Provider/Extender: [name] in Treatment: 45 Constitutional respirations regular, non-labored and within target range for patient.. Cardiovascular 2+ dorsalis pedis/posterior tibialis pulses. Psychiatric pleasant and cooperative. Notes Open wound with granulation tissue and nonviable tissue at the heel. No signs of surrounding infection. Electronic Signature(s) Signed: 01/19/2023 3:28:22 PM By: Geralyn Corwin DO Entered By: Geralyn Corwin on 01/19/2023 15:17:09 -------------------------------------------------------------------------------- Physician Orders Details Patient Name: Date of Service: [name] Bushy C. 01/19/2023 2:15 PM Medical Record Number: [id number] Patient Account Number: [id number] Date of Birth/Sex: Treating RN: 1961-03-28 (61 y.o. [name], [name] Primary Care Provider: [name] Other Clinician: Referring Provider: Treating Provider/Extender: [name] in Treatment: 66 Verbal / Phone Orders: No Diagnosis Coding ICD-10 Coding Code Description [phone] Non-pressure chronic ulcer of other part of left foot with fat layer exposed E11.621 Type 2 diabetes mellitus with foot ulcer E66.01 Morbid (severe) obesity due to excess calories Follow-up Appointments ppointment in 1 week. - Dr. Mikey Bussing 01/26/23 @ 8:15 Return A ppointment in 2 weeks. - Dr.  Rilen Shukla 02/02/2023 0815 Return A Other: - Leave dressing in place. Facility not to change dressing. Anesthetic (In clinic) Topical Lidocaine 5% applied to wound bed (In clinic) Topical Lidocaine 4% applied to wound bed Cellular or Tissue Based Products Ragin, Aniyiah C ([id number]) [id number] Page 5 of 11 Cellular or Tissue Based Product Type: - Run IVR for EpiFix and Epicord=100% covered 05/07/22: Epicord ordered 12/08/2022: Run insurance for epicord and theraskin. 01/19/2023: Run for organogenesis- puraply AM daptic or Mepitel. (DO NOT REMOVE). - Cellular or Tissue Based Product applied to wound bed, secured with steri-strips, cover with A Epicord #1 05/15/25 Epicord #2 05/22/22 Epicord #3 06/29/2022 Epicord #4 07/07/2022 Epifix #5 07/14/22 Epicord #6 07/20/22 Epicord # 7 07/27/22 ***Run for Grafix***PENDING Epicord #8 08/04/2022 2024-IVR epifix approved 12/29/2022 #1 epifix applied to left heel. 01/05/2023 #2 epifix applied to left heel. 01/12/2023 #3 epifix applied to left heel 01/19/2023 #4 epifix applied to left heel. Bathing/ Shower/ Hygiene May shower with protection but do not get wound dressing(s) wet. Protect dressing(s) with water repellant cover (for example, large plastic bag) or a cast cover and may then take shower. Edema Control - Lymphedema / SCD / Other Elevate legs to the level of the heart or above for 30 minutes daily and/or when sitting for 3-4 times a day throughout the day. Avoid standing for long periods of  time. Off-Loading Total Contact Cast to Left Lower Extremity - size 4 Additional Orders / Instructions Follow Nutritious Diet - Monitor/Control Blood Sugar Wound Treatment Wound #2 - Calcaneus Wound Laterality: Left, Distal Cleanser: Soap and Water 1 x Per Week/30 Days Discharge Instructions: May shower and wash wound with dial antibacterial soap and water prior to dressing change. Cleanser: Wound Cleanser 1 x Per Week/30 Days Discharge Instructions: Cleanse the wound with wound cleanser prior to applying a clean dressing using gauze sponges, not tissue or cotton balls. Prim Dressing: epifix #4 1 x Per Week/30 Days ary Discharge Instructions: applied by provider Prim Dressing: adaptic and steri-strips 1 x Per Week/30 Days ary Discharge Instructions: secured the epifix Secondary Dressing: ABD Pad, 8x10 1 x Per Week/30 Days Discharge Instructions: Apply over primary dressing as directed. Secondary Dressing: Woven Gauze Sponge, Non-Sterile 4x4 in (Home Health) 1 x Per Week/30 Days Discharge Instructions: Apply over primary dressing as directed. Secured With: [employer], 4.5x3.1 (in/yd) 1 x Per Week/30 Days Discharge Instructions: Secure with Kerlix as directed. Electronic Signature(s) Signed: 01/19/2023 3:28:22 PM By: Geralyn Corwin DO Entered By: Geralyn Corwin on 01/19/2023 15:17:17 -------------------------------------------------------------------------------- Problem List Details Patient Name: Date of Service: [name] Bushy C. 01/19/2023 2:15 PM Medical Record Number: [id number] Patient Account Number: [id number] Date of Birth/Sex: Treating RN: June 18, 1961 (61 y.o. [name], [name] Primary Care Provider: [name] Other Clinician: Referring Provider: Treating Provider/Extender: [name] in Treatment: 45 Active Problems ICD-10 Maheu, Austine C ([id number]) [id number] Page 6 of 11 Encounter Code  Description Active Date MDM Diagnosis L97.522 Non-pressure chronic ulcer of other part of left foot with fat layer exposed 03/05/2022 No Yes E11.621 Type 2 diabetes mellitus with foot ulcer 03/05/2022 No Yes E66.01 Morbid (severe) obesity due to excess calories 03/05/2022 No Yes Inactive Problems Resolved Problems Electronic Signature(s) Signed: 01/19/2023 3:28:22 PM By: Geralyn Corwin DO Entered By: Geralyn Corwin on 01/19/2023 15:16:02 -------------------------------------------------------------------------------- Progress Note Details Patient Name: Date of Service: [name] Bushy C. 01/19/2023 2:15 PM Medical Record Number: [id number] Patient Account Number: [id number] Date of Birth/Sex: Treating RN: [birth year] (61 y.o. F)  Primary Care Provider: [name] Other Clinician: Referring Provider: Treating Provider/Extender: [name] in Treatment: 45 Subjective Chief Complaint Information obtained from Patient 03/05/2022; left foot wound History of Present Illness (HPI) Admission 03/05/2022 [name] is a 62 year old female with a past medical history of uncontrolled insulin-dependent type 2 diabetes, tobacco user and chronic diastolic heart failure that presents to the clinic for a 102-month history of wound to her left heel. She states she had a left ankle fusion in September 2022. She states that she always had a wound after the surgery and it never healed. She has home health and they have been doing compression wraps along with silver alginate to the wound bed. She currently denies signs of infection. 6/8; this is a 62 year old woman with type 2 diabetes. She developed a wound on her left Achilles heel just above the tip of the heel in the setting of recurrent ankle surgeries in late 2022. I have seen some of these results from either the cast or the surgical boots that are put on after these operations. She thinks this may be the case. And a  sense of pressure ulcer. In any case that she has been using Santyl Hydrofera Blue under compression. She is not wearing any footwear at home as she cannot find anything to accommodate the wrap 6/22; patient presents for follow-up. She has home health that comes out once a week to help with dressing changes. She has no issues or complaints today. We have been using Hydrofera Blue under compression therapy. 7/6; patient presents for follow-up. She states that home health did not come out for the past 2 weeks. It is unclear why. We have been using Hydrofera Blue and Santyl under compression therapy. 7/13; patient presents for follow-up. She did not take the oral antibiotics prescribed at last clinic visit. We have been using Hydrofera Blue with gentamicin/mupirocin ointment under compression therapy. She has no issues or complaints today. She denies signs of infection. 7/20; patient presents for follow-up. We have been using Hydrofera Blue with antibiotic ointment under 3 layer compression. She has home health that change the dressing once. They put collagen on the wound bed. She also reports falling yesterday and hitting her right foot. She has no pain to the area today. 7/27; patient presents for follow-up. We have been using Hydrofera Blue under 3 layer compression. She has home health that changes the dressing. She has no issues or complaints today. 8/3; patient presents for follow-up. We continues to use Hydrofera Blue under 3 layer compression. She has no issues or complaints today. She has been approved for Epicord at 100%. 8/11; patient presents for follow-up. We have been using Hydrofera Blue under 3 layer compression. She has been approved for Epicord and we have this today. She is in agreement with having this applied. 8/18; patient presents for follow-up. Epicord #1 was placed in standard fashion last clinic visit. She has no issues or complaints today. 9/18; Unfortunately patient has  missed her last clinic appointments due to falling and breaking her right ankle. We have been following her for her left ankle wound. She has also developed a large blister to the left heel over the past week that has ruptured and dried. She currently resides In [facility]. Wound9/25; patient presents for follow-up. We have been using Hydrofera Blue and Santyl to the original left ankle wound and Xeroform to the dried blistered. We have been wrapping her with compression therapy. She resides in a  facility and they changed the wrap once this past week. She has no issues or Linders, Kourtnee C ([id number]) [phone].pdf Page 7 of 11 complaints today. She denies signs of infection. 10/3; patient presents for follow-up. We have been using Xeroform to the heel wound and Epicort to the left ankle wound All under compression therapy. She has no issues or complaints today. 10/10; patient presents for follow-up. We have been using Epicort to the left ankle wound and Santyl and Hydrofera Blue to the heel wound. All under compression therapy. she has no issues or complaints today. 10/16; patient presents for follow-up. Epicort was placed in standard fashion to the superior wound and onto the heel and Santyl and Hydrofera Blue. She states that the wrap got wet during her shower and her facility was able to rewrap with Kerlix/Coban. 10/23; patient presents for follow-up. Epicord was placed to the wound beds last clinic visit. We continue Kerlix/Coban. She has no issues or complaints today. 10/31; Patient presents for follow-up. Epicord was placed to the wound beds last clinic visit. The original wound is almost healed. We have done this under Kerlix/Coban. She denies signs of infection. 11/10; patient presents for follow-up. Patient's last epi cord was placed in standard fashion at last clinic visit. The original wound has healed. She still has a heel wound. Grafix was approved however too  costly for the patient. We will rerun epi cord to see if she can have more applications. She had done very well with this. We have been using Hydrofera Blue to the heel under Kerlix/Coban. 11/21; patient presents for follow-up. We have been using Hydrofera Blue and Santyl to the heel under Kerlix/Coban. She switched to a new healthcare insurance and again The skin substitute is too costly for the patient. She denies signs of infection. 11/28; patient presents for follow-up. Her facility did not change the wrap last clinic visit due to lack of staffing. The wrap was taken off and Hydrofera Blue dressing has been in place for the past week. She has no issues or complaints today. 12/5; patient presents for follow-up. She states she has had more drainage over the past week. They did not change the wrap at her facility. She states the nurse will be back this week. She is also been doing a lot of physical therapy. She has been using the Prevalon boot while in bed. 12/12; patient presents for follow-up. We have been using Hydrofera Blue with antibiotic ointment under 4-layer compression. She is tolerated the wrap well. She states she is using her Prevalon boot while in the wheelchair. She has no issues or complaints today. There is been improvement in wound healing. 12/19; patient presents for follow-up. We have been using Hydrofera Blue and antibiotic ointment to the wound bed under 4-layer compression. Her facility change the wrap once. She states she has been using the Prevalon boot in bed but not in the wheelchair due to issues with transferring. Overall there is still improvement in wound healing. 1/16; patient presents for follow-up. She missed her last clinic appointment. We have been using Hydrofera Blue and antibiotic ointment under 4-layer compression. At the facility this has only been changed twice over the past 3 weeks. They are not using 4-layer compression. She came in with Kerlix/Coban. 1/23;  patient presents for follow-up. We have been using Santyl and Hydrofera Blue under 4-layer compression. Patient has no issues or complaints today. 2/1; patient presents for follow-up. Patient's been using Santyl and Hydrofera Blue to the wound bed.  She has developed more slough and now a mild odor. She states she is wearing the Prevalon boot at night. It is unclear if she is offloading this area during the day. 2/13; patient presents for follow-up. Patient's been using Dakin's wet-to-dry dressings. She received her Keystone antibiotic ointment in the mail. She has not used this yet. She reports using her Prevalon boot. She has no issues or complaints today. 2/20; patient presents for follow-up. She has been using Keystone antibiotic ointment with Hydrofera Blue. She has no issues or complaints today. Due to transportation she cannot do the total contact cast today. She is scheduled for next week to have this placed. 2/27; patient presents for follow-up. Plan is for the total contact cast today. We have been using Keystone and Hydrofera Blue to the wound bed. She forgot her Keystone antibiotic ointment. She has no issues or complaints today. 2/29; patient presents for follow-up. Patient presents for her obligatory cast change. She has no issues or complaints today. 3/5; patient presents for follow-up. She has had the cast in place for the past week and has tolerated this well. She has no issues or complaints today. We have been using PolyMem with Keystone antibiotic ointment. 12/15/2022: The wound is quite clean and measured smaller today. 3/19; we have been using Keystone antibiotic ointment with PolyMem silver under the total contact cast. Overall there is been improvement in wound healing. 3/26; patient presents for follow-up. She is been approved for epi fix. She would like to proceed with this today. Previously we have been using Keystone antibiotic ointment with PolyMem silver under the total  contact cast. She has no issues or complaints. 01/05/2023: The wound is clean and she is tolerating total contact casting without difficulty. She had her first application of EpiFix last week. 4/9; patient presents for follow-up. We have been applying EpiFix under the total contact cast for the past 2 weeks. She is tolerated this well. She has no issues or complaints today. 4/16; patient presents for follow-up. We have been using EpiFix under the total contact cast. She has done well with this. Wound is smaller. She does have slough buildup. Patient History Information obtained from Patient, Chart. Family History Cancer - Father,Mother, Diabetes - Mother,Maternal Grandparents,Paternal Grandparents, Heart Disease - Siblings,Maternal Grandparents, Hypertension - Siblings, Tuberculosis - Maternal Grandparents,Paternal Grandparents. Social History Current every day smoker, Marital Status - Single, Alcohol Use - Never, Drug Use - No History, Caffeine Use - Daily. 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 Vitals Time Taken: 2:10 PM, Height: 66 in, Weight: 339 lbs, BMI: 54.7, Temperature: 98.2 F, Pulse: 93 bpm, Respiratory Rate: 20 breaths/min, Blood Pressure: 137/75 mmHg, Capillary Blood  Glucose: 126 mg/dl. Cardiovascular 2+ dorsalis pedis/posterior tibialis pulses. Psychiatric pleasant and cooperative. General Notes: Open wound with granulation tissue and nonviable tissue at the heel. No signs of surrounding infection. Integumentary (Hair, Skin) Wound #2 status is Open. Original cause of wound was Blister. The date acquired was: 06/15/2022. The wound has  been in treatment 29 weeks. The wound is located on the Left,Distal Calcaneus. The wound measures 2cm length x 2.8cm width x 0.1cm depth; 4.398cm^2 area and 0.44cm^3 volume. There is Fat Layer (Subcutaneous Tissue) exposed. There is no tunneling or undermining noted. There is a medium amount of serosanguineous drainage noted. The wound margin is distinct with the outline attached to the wound base. There is large (67-100%) red, pink, hyper - granulation within the wound bed. There is a small (1-33%) amount of necrotic tissue within the wound bed including Adherent Slough. The periwound skin appearance had no abnormalities noted for texture. The periwound skin appearance had no abnormalities noted for color. The periwound skin appearance did not exhibit: Dry/Scaly, Maceration. Periwound temperature was noted as No Abnormality. Assessment Active Problems ICD-10 Non-pressure chronic ulcer of other part of left foot with fat layer exposed Type 2 diabetes mellitus with foot ulcer Morbid (severe) obesity due to excess calories Patient's wound has shown improvement in size in appearance since last clinic visit. I debrided nonviable tissue. EpiFix was placed in standard fashion. We continued the total contact cast. Follow-up in 1 week. 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 There was a Excisional Skin/Subcutaneous Tissue Debridement with a total area of 5.6 sq cm performed  by Geralyn Corwin, DO. With the following instrument(s): Curette to remove Viable and Non-Viable tissue/material. Material removed includes Subcutaneous Tissue, Slough, and Hyper-granulation after achieving pain control using Lidocaine 4% Topical Solution. A time out was conducted at 14:20, prior to the start of the procedure. A Minimum amount of bleeding was controlled with Silver Nitrate. The procedure was tolerated well with a pain level of 0 throughout and a pain level of 0 following the procedure. Post Debridement Measurements: 2cm length x 2.8cm width x 0.1cm depth; 0.44cm^3 volume. Character of Wound/Ulcer Post Debridement is improved. Severity of Tissue Post Debridement is: Fat layer exposed. Post procedure Diagnosis Wound #2: Same as Pre-Procedure Pre-procedure diagnosis of Wound #2 is a Diabetic Wound/Ulcer of the Lower Extremity located on the Left,Distal Calcaneus. A skin graft procedure using a bioengineered skin substitute/cellular or tissue based product was performed by Geralyn Corwin, DO with the following instrument(s): Forceps and Scissors. Epifix was applied and secured with Steri-Strips and adaptic. 11 sq cm of product was utilized and 0 sq cm was wasted. Post Application, no dressing was applied. A Time Out was conducted at 14:30, prior to the start of the procedure. The procedure was tolerated well with a pain level of 0 throughout and a pain level of 0 following the procedure. Post procedure Diagnosis Wound #2: Same as Pre-Procedure Banfill, Suzann C ([id number]) [id number] Page 9 of 11 . Pre-procedure diagnosis of Wound #2 is a Diabetic Wound/Ulcer of the Lower Extremity located on the Left,Distal Calcaneus . There was a T Research scientist (life sciences) otal Procedure by Geralyn Corwin, DO. Post procedure Diagnosis Wound #2: Same as Pre-Procedure Plan Follow-up Appointments: Return Appointment in 1 week. - Dr. Mikey Bussing 01/26/23 @ 8:15 Return Appointment in 2  weeks. - Dr. Mikey Bussing 02/02/2023 0815 Other: - Leave dressing in place. Facility not to change dressing. Anesthetic: (In clinic) Topical Lidocaine 5% applied to wound bed (In clinic) Topical Lidocaine 4% applied to wound bed Cellular or Tissue Based Products: Cellular or Tissue Based Product Type: - Run IVR for EpiFix and Epicord=100% covered 05/07/22: Epicord ordered 12/08/2022: Run insurance for  epicord and theraskin. 01/19/2023: Run for organogenesis- puraply AM Cellular or Tissue Based Product applied to wound bed, secured with steri-strips, cover with Adaptic or Mepitel. (DO NOT REMOVE). - Epicord #1 05/15/25 Epicord #2 05/22/22 Epicord #3 06/29/2022 Epicord #4 07/07/2022 Epifix #5 07/14/22 Epicord #6 07/20/22 Epicord # 7 07/27/22 ***Run for Grafix***PENDING Epicord #8 08/04/2022 2024-IVR epifix approved 12/29/2022 #1 epifix applied to left heel. 01/05/2023 #2 epifix applied to left heel. 01/12/2023 #3 epifix applied to left heel 01/19/2023 #4 epifix applied to left heel. Bathing/ Shower/ Hygiene: May shower with protection but do not get wound dressing(s) wet. Protect dressing(s) with water repellant cover (for example, large plastic bag) or a cast cover and may then take shower. Edema Control - Lymphedema / SCD / Other: Elevate legs to the level of the heart or above for 30 minutes daily and/or when sitting for 3-4 times a day throughout the day. Avoid standing for long periods of time. Off-Loading: T Contact Cast to Left Lower Extremity - size 4 otal Additional Orders / Instructions: Follow Nutritious Diet - Monitor/Control Blood Sugar WOUND #2: - Calcaneus Wound Laterality: Left, Distal Cleanser: Soap and Water 1 x Per Week/30 Days Discharge Instructions: May shower and wash wound with dial antibacterial soap and water prior to dressing change. Cleanser: Wound Cleanser 1 x Per Week/30 Days Discharge Instructions: Cleanse the wound with wound cleanser prior to applying a clean dressing using gauze  sponges, not tissue or cotton balls. Prim Dressing: epifix #4 1 x Per Week/30 Days ary Discharge Instructions: applied by provider Prim Dressing: adaptic and steri-strips 1 x Per Week/30 Days ary Discharge Instructions: secured the epifix Secondary Dressing: ABD Pad, 8x10 1 x Per Week/30 Days Discharge Instructions: Apply over primary dressing as directed. Secondary Dressing: Woven Gauze Sponge, Non-Sterile 4x4 in (Home Health) 1 x Per Week/30 Days Discharge Instructions: Apply over primary dressing as directed. Secured With: [employer], 4.5x3.1 (in/yd) 1 x Per Week/30 Days Discharge Instructions: Secure with Kerlix as directed. 1. In office sharp debridement 2. EpiFix placed in standard fashion 3. T contact cast placed in standard fashion otal 4. Follow-up in 1 week Electronic Signature(s) Signed: 01/19/2023 3:28:22 PM By: Geralyn Corwin DO Entered By: Geralyn Corwin on 01/19/2023 15:17:52 -------------------------------------------------------------------------------- HxROS Details Patient Name: Date of Service: [name], [name]. 01/19/2023 2:15 PM Medical Record Number: [id number] Patient Account Number: [id number] Date of Birth/Sex: Treating RN: June 10, 1961 (61 y.o. F) Primary Care Provider: [name] Other Clinician: Referring Provider: Treating Provider/Extender: [name] in Treatment: 45 Information Obtained From Patient Chart Hematologic/Lymphatic Medical History: Positive for: Anemia Brakebill, Candela C ([id number]) [phone].pdf Page 10 of 11 Respiratory Medical History: Positive for: Chronic Obstructive Pulmonary Disease (COPD) Cardiovascular Medical History: Positive for: Congestive Heart Failure - Weighs self daily; Hypertension; Myocardial Infarction; Peripheral Venous Disease Gastrointestinal Medical History: Past Medical History Notes: GERD, Peptic Ulcer Disease Endocrine Medical  History: Positive for: Type II Diabetes Time with diabetes: 16 years Treated with: Insulin, Oral agents Blood sugar tested every day: Yes Tested : Twice daily Musculoskeletal Medical History: Past Medical History Notes: Broke left leg 3 years ago, 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, foot fused Neurologic Medical History: Positive for: Neuropathy Psychiatric Medical History: Past Medical History Notes: Depression Immunizations Pneumococcal Vaccine: Received Pneumococcal Vaccination: No Implantable Devices None Family and Social History Cancer: Yes - Father,Mother; Diabetes: Yes - Mother,Maternal Grandparents,Paternal Grandparents; Heart Disease: Yes - Siblings,Maternal Grandparents; Hypertension: Yes - Siblings; Tuberculosis: Yes - Maternal Grandparents,Paternal Grandparents; Current every day smoker;  Marital Status - Single; Alcohol Use: Never; Drug Use: No History; Caffeine Use: Daily; Financial Concerns: No; Food, Clothing or Shelter Needs: No; Support System Lacking: No; Transportation Concerns: No Electronic Signature(s) Signed: 01/19/2023 3:28:22 PM By: Geralyn Corwin DO Entered By: Geralyn Corwin on 01/19/2023 15:16:45 -------------------------------------------------------------------------------- Total Contact Cast Details Patient Name: Date of Service: [name] Bushy C. 01/19/2023 2:15 PM Medical Record Number: [id number] Patient Account Number: [id number] Date of Birth/Sex: Treating RN: 21-Sep-1961 (61 y.o. [name], [name] Primary Care Provider: [name] Other Clinician: Referring Provider: Treating Provider/Extender: [name] in Treatment: [id number] Contact Cast Applied for Wound Assessment: otal Wound #2 Left,Distal Calcaneus Performed By: Physician Geralyn Corwin, DO Post Procedure Diagnosis Kretzschmar, Xinyi C ([id number]) [id number] Page 11 of 11 Same as Pre-procedure Electronic Signature(s) Signed:  01/19/2023 3:28:22 PM By: Geralyn Corwin DO Signed: 01/19/2023 4:54:11 PM By: Shawn Stall RN, BSN Entered By: Shawn Stall on 01/19/2023 14:25:54 -------------------------------------------------------------------------------- SuperBill Details Patient Name: Date of Service: [name] Bushy C. 01/19/2023 Medical Record Number: [id number] Patient Account Number: [id number] Date of Birth/Sex: Treating RN: September 14, 1961 (61 y.o. [name] Primary Care Provider: [name] Other Clinician: Referring Provider: Treating Provider/Extender: [name] in Treatment: 45 Diagnosis Coding ICD-10 Codes Code Description [phone] Non-pressure chronic ulcer of other part of left foot with fat layer exposed E11.621 Type 2 diabetes mellitus with foot ulcer E66.01 Morbid (severe) obesity due to excess calories Facility Procedures : CPT4 Code: [id number] Description: Q4186 Epifix 4cm x 4.5cm Modifier: Quantity: 11 : CPT4 Code: [id number] Description: 15275 - SKIN SUB GRAFT FACE/NK/HF/G ICD-10 Diagnosis Description L97.522 Non-pressure chronic ulcer of other part of left foot with fat layer exposed E11.621 Type 2 diabetes mellitus with foot ulcer Modifier: Quantity: 1 Physician Procedures : CPT4 Code Description Modifier [id number] 15275 - WC PHYS SKIN SUB GRAFT FACE/NK/HF/G ICD-10 Diagnosis Description L97.522 Non-pressure chronic ulcer of other part of left foot with fat layer exposed E11.621 Type 2 diabetes mellitus with foot ulcer Quantity: 1 Electronic Signature(s) Signed: 01/19/2023 3:28:22 PM By: Geralyn Corwin DO Entered By: Geralyn Corwin on 01/19/2023 15:18:09

## 2023-01-26 ENCOUNTER — Encounter (HOSPITAL_BASED_OUTPATIENT_CLINIC_OR_DEPARTMENT_OTHER): Payer: 59 | Admitting: Internal Medicine

## 2023-01-26 DIAGNOSIS — L97522 Non-pressure chronic ulcer of other part of left foot with fat layer exposed: Secondary | ICD-10-CM | POA: Diagnosis not present

## 2023-01-26 DIAGNOSIS — E11621 Type 2 diabetes mellitus with foot ulcer: Secondary | ICD-10-CM

## 2023-01-29 NOTE — Progress Notes (Signed)
Rambeau, Srinika [name] ([id number]) [id number].pdf Page 1 of 7 Visit Report for 01/26/2023 Arrival Information Details Patient Name: Date of Service: [name] 01/26/2023 8:15 A M Medical Record Number: [id number] Patient Account Number: [id number] Date of Birth/Sex: Treating RN: 01/30/1961 (62 y.o. [name] Primary Care Annemarie Sebree: [name] Other Clinician: Referring Telesha Deguzman: Treating Peni Rupard/Extender: [name]: 47 Visit Information History Since Last Visit Added or deleted any medications: No Patient Arrived: Wheel Chair Any new allergies or adverse reactions: No Arrival Time: 08:13 Had a fall or experienced change in No Accompanied By: self activities of daily living that may affect Transfer Assistance: None risk of falls: Patient Identification Verified: Yes Signs or symptoms of abuse/neglect since last visito No Secondary Verification Process Completed: Yes Hospitalized since last visit: No Patient Requires Transmission-Based Precautions: No Implantable device outside of the clinic excluding No Patient Has Alerts: No cellular tissue based products placed in the center since last visit: Has Dressing in Place as Prescribed: Yes Has Footwear/Offloading in Place as Prescribed: Yes Left: T Contact Cast otal Pain Present Now: Yes Electronic Signature(s) Signed: 01/28/2023 3:29:20 PM By: Redmond Pulling RN, BSN Entered By: Redmond Pulling on 01/26/2023 08:14:27 -------------------------------------------------------------------------------- Encounter Discharge Information Details Patient Name: Date of Service: [name]. 01/26/2023 8:15 A M Medical Record Number: [id number] Patient Account Number: [id number] Date of Birth/Sex: Treating RN: 10-10-1960 (61 y.o. [name] Primary Care Lucielle Vokes: [name] Other Clinician: Referring Khristin Keleher: Treating Kamyrah Feeser/Extender:  [name]: 47 Encounter Discharge Information Items Post Procedure Vitals Discharge Condition: Stable Temperature (F): 98.2 Ambulatory Status: Wheelchair Pulse (bpm): 92 Discharge Destination: Home Respiratory Rate (breaths/min): 18 Transportation: Private Auto Blood Pressure (mmHg): 122/60 Accompanied By: self Schedule Follow-up Appointment: Yes Clinical Summary of Care: Patient Declined Electronic Signature(s) Signed: 01/28/2023 3:29:20 PM By: Redmond Pulling RN, BSN Entered By: Redmond Pulling on 01/26/2023 09:42:55 -------------------------------------------------------------------------------- Lower Extremity Assessment Details Patient Name: Date of Service: [name]. 01/26/2023 8:15 A M Medical Record Number: [id number] Patient Account Number: [id number] Date of Birth/Sex: Treating RN: [birth year] (62 y.o. [name] Primary Care Ran Tullis: [name] Other Clinician: Referring Raciel Caffrey: Treating Lisa Blakeman/Extender: [name]: 47 Edema Assessment Left: [Left: Right] [Right: :] Assessed: [Left: No] [Right: No] Edema: [Left: Ye] [Right: s] Calf Left: Right: Point of Measurement: 28 cm From Medial Instep 47 cm Ankle Left: Right: Point of Measurement: 3 cm From Medial Instep 30.5 cm Vascular Assessment Pulses: Dorsalis Pedis Palpable: [Left:Yes] Electronic Signature(s) Signed: 01/28/2023 3:29:20 PM By: Redmond Pulling RN, BSN Entered By: Redmond Pulling on 01/26/2023 08:25:01 -------------------------------------------------------------------------------- Multi Wound Chart Details Patient Name: Date of Service: [name]. 01/26/2023 8:15 A M Medical Record Number: [id number] Patient Account Number: [id number] Date of Birth/Sex: Treating RN: [birth year] (61 y.o. F) Primary Care Nawaal Alling: [name] Other Clinician: Referring Amery Vandenbos: Treating  Adriana Lina/Extender: [name]: 46 Vital Signs Height(in): 66 Pulse(bpm): 92 Weight(lbs): 339 Blood Pressure(mmHg): 122/60 Body Mass Index(BMI): 54.7 Temperature(F): 98.2 Respiratory Rate(breaths/min): 18 [2:Photos:] [N/A:N/A] Left, Distal Calcaneus N/A N/A Wound Location: Blister N/A N/A Wounding Event: Diabetic Wound/Ulcer of the Lower N/A N/A Primary Etiology: Extremity Anemia, Chronic Obstructive N/A N/A Comorbid History: Pulmonary Disease (COPD), Congestive Heart Failure, Hypertension, Myocardial Infarction, Peripheral Venous Disease, Type II Diabetes, Neuropathy 06/15/2022 N/A N/A Date Acquired: 30 N/A N/A Weeks of [name]: Open N/A N/A Wound Status: No N/A N/A Wound  Recurrence: 2x2.5x0.1 N/A N/A Measurements L x W x D (cm) 3.927 N/A N/A A (cm) : rea 0.393 N/A N/A Volume (cm) : 72.20% N/A N/A % Reduction in A rea: 72.20% N/A N/A % Reduction in Volume: Grade 2 N/A N/A Classification: Medium N/A N/A Exudate A mount: Serosanguineous N/A N/A Exudate Type: red, brown N/A N/A Exudate Color: [name], [name] ([id number]) [id number] Page 3 of 7 Distinct, outline attached N/A N/A Wound Margin: Large (67-100%) N/A N/A Granulation Amount: Red, Pink, Hyper-granulation N/A N/A Granulation Quality: Small (1-33%) N/A N/A Necrotic Amount: Fat Layer (Subcutaneous Tissue): Yes N/A N/A Exposed Structures: Fascia: No Tendon: No Muscle: No Joint: No Bone: No Medium (34-66%) N/A N/A Epithelialization: Debridement - Selective/Open Wound N/A N/A Debridement: Pre-procedure Verification/Time Out 08:55 N/A N/A Taken: Lidocaine 4% Topical Solution N/A N/A Pain Control: Slough N/A N/A Tissue Debrided: Non-Viable Tissue N/A N/A Level: 3.92 N/A N/A Debridement A (sq cm): rea Curette N/A N/A Instrument: Minimum N/A N/A Bleeding: Pressure N/A N/A Hemostasis A chieved: 0 N/A  N/A Procedural Pain: 0 N/A N/A Post Procedural Pain: Procedure was tolerated well N/A N/A Debridement [name] Response: 2x2.5x0.1 N/A N/A Post Debridement Measurements L x W x D (cm) 0.393 N/A N/A Post Debridement Volume: (cm) No Abnormalities Noted N/A N/A Periwound Skin Texture: Maceration: No N/A N/A Periwound Skin Moisture: Dry/Scaly: No No Abnormalities Noted N/A N/A Periwound Skin Color: No Abnormality N/A N/A Temperature: Cellular or Tissue Based Product N/A N/A Procedures Performed: Debridement T Contact Cast otal [name] Notes Wound #2 (Calcaneus) Wound Laterality: Left, Distal Cleanser Soap and Water Discharge Instruction: May shower and wash wound with dial antibacterial soap and water prior to dressing change. Wound Cleanser Discharge Instruction: Cleanse the wound with wound cleanser prior to applying a clean dressing using gauze sponges, not tissue or cotton balls. Peri-Wound Care Topical Primary Dressing epifix Discharge Instruction: applied by Nevan Creighton adaptic and steri-strips Discharge Instruction: secured the epifix Secondary Dressing ABD Pad, 8x10 Discharge Instruction: Apply over primary dressing as directed. Woven Gauze Sponge, Non-Sterile 4x4 in Discharge Instruction: Apply over primary dressing as directed. Secured With [employer], 4.5x3.1 (in/yd) Discharge Instruction: Secure with Kerlix as directed. Compression Wrap Compression Stockings Add-Ons Electronic Signature(s) Signed: 01/26/2023 12:19:49 PM By: Geralyn Corwin DO Entered By: Geralyn Corwin on 01/26/2023 11:18:22 Ofallon, Jelesa [name] ([id number]) [id number] Page 4 of 7 -------------------------------------------------------------------------------- Multi-Disciplinary Care Plan Details Patient Name: Date of Service: [name] 01/26/2023 8:15 A M Medical Record Number: [id number] Patient Account Number: [id number] Date of Birth/Sex:  Treating RN: 1961-05-12 (62 y.o. [name] Primary Care Scotti Motter: [name] Other Clinician: Referring Ryler Laskowski: Treating Halah Whiteside/Extender: [name]: 50 Active Inactive Abuse / Safety / Falls / Self Care Management Nursing Diagnoses: History of Falls Goals: Patient will not experience any injury related to falls Date Initiated: 03/05/2022 Target Resolution Date: 02/05/2023 Goal Status: Active Interventions: Assess fall risk on admission and as needed Assess: immobility, friction, shearing, incontinence upon admission and as needed Assess impairment of mobility on admission and as needed per policy Notes: 04/30/22: Fall prevention ongoing, recent fall. Wound/Skin Impairment Nursing Diagnoses: Impaired tissue integrity Goals: Patient/caregiver will verbalize understanding of skin care regimen Date Initiated: 03/05/2022 Target Resolution Date: 02/06/2023 Goal Status: Active Ulcer/skin breakdown will have a volume reduction of 30% by week 4 Date Initiated: 03/05/2022 Date Inactivated: 04/30/2022 Target Resolution Date: 05/02/2022 Goal Status: Met Ulcer/skin breakdown will have a volume reduction of 50% by week 8 Date Initiated: 04/30/2022  Date Inactivated: 07/07/2022 Target Resolution Date: 07/04/2022 Unmet Reason: patient sent three Goal Status: Unmet weeks inpatient and rehab before returning to wound center. Interventions: Assess patient/caregiver ability to obtain necessary supplies Assess patient/caregiver ability to perform ulcer/skin care regimen upon admission and as needed Assess ulceration(s) every visit Provide education on smoking Provide education on ulcer and skin care [name] Activities: Topical wound management initiated : 03/05/2022 Notes: 04/30/22: Wound care regimen continues. Electronic Signature(s) Signed: 01/28/2023 3:29:20 PM By: Redmond Pulling RN, BSN Entered By: Redmond Pulling on 01/26/2023  08:43:42 -------------------------------------------------------------------------------- Pain Assessment Details Patient Name: Date of Service: [name]. 01/26/2023 8:15 A M Medical Record Number: [id number] Patient Account Number: [id number] Date of Birth/Sex: Treating RN: [birth year] (62 y.o. [name] Primary Care Kaleab Frasier: [name] Other Clinician: Lou Miner, Wendell [name] ([id number]) [phone].pdf Page 5 of 7 Referring Vivica Dobosz: Treating Jourden Gilson/Extender: [name]: 47 Active Problems Location of Pain Severity and Description of Pain Patient Has Paino Yes Site Locations Rate the pain. Current Pain Level: 4 Pain Management and Medication Current Pain Management: Electronic Signature(s) Signed: 01/28/2023 3:29:20 PM By: Redmond Pulling RN, BSN Entered By: Redmond Pulling on 01/26/2023 08:16:47 -------------------------------------------------------------------------------- Patient/Caregiver Education Details Patient Name: Date of Service: [name] 4/23/2024andnbsp8:15 A M Medical Record Number: [id number] Patient Account Number: [id number] Date of Birth/Gender: Treating RN: Jun 18, 1961 (62 y.o. [name] Primary Care Physician: [name] Other Clinician: Referring Physician: Treating Physician/Extender: [name]: 1 Education Assessment Education Provided To: Patient Education Topics Provided Wound/Skin Impairment: Methods: Explain/Verbal Responses: State content correctly [name]) Signed: 01/28/2023 3:29:20 PM By: Redmond Pulling RN, BSN Entered By: Redmond Pulling on 01/26/2023 08:44:02 -------------------------------------------------------------------------------- Wound Assessment Details Patient Name: Date of Service: [name]. 01/26/2023 8:15 A M Medical Record Number: [id number] Patient  Account Number: [id number] Date of Birth/Sex: Treating RN: 08-Jan-1961 (62 y.o. [name] Primary Care Vale Mousseau: [name] Other Clinician: Referring Joden Bonsall: Treating Philippa Vessey/Extender: [name]: [phone] [place] [name] ([id number]) [id number] Page 6 of 7 Wound Status Wound Number: 2 Primary Diabetic Wound/Ulcer of the Lower Extremity Etiology: Wound Location: Left, Distal Calcaneus Wound Open Wounding Event: Blister Status: Date Acquired: 06/15/2022 Comorbid Anemia, Chronic Obstructive Pulmonary Disease (COPD), Weeks Of [name]: 30 History: Congestive Heart Failure, Hypertension, Myocardial Infarction, Clustered Wound: No Peripheral Venous Disease, Type II Diabetes, Neuropathy Photos Wound Measurements Length: (cm) 2 Width: (cm) 2.5 Depth: (cm) 0.1 Area: (cm) 3.927 Volume: (cm) 0.393 % Reduction in Area: 72.2% % Reduction in Volume: 72.2% Epithelialization: Medium (34-66%) Tunneling: No Undermining: No Wound Description Classification: Grade 2 Wound Margin: Distinct, outline attached Exudate Amount: Medium Exudate Type: Serosanguineous Exudate Color: red, brown Foul Odor After Cleansing: No Slough/Fibrino Yes Wound Bed Granulation Amount: Large (67-100%) Exposed Structure Granulation Quality: Red, Pink, Hyper-granulation Fascia Exposed: No Necrotic Amount: Small (1-33%) Fat Layer (Subcutaneous Tissue) Exposed: Yes Necrotic Quality: Adherent Slough Tendon Exposed: No Muscle Exposed: No Joint Exposed: No Bone Exposed: No Periwound Skin Texture Texture Color No Abnormalities Noted: Yes No Abnormalities Noted: Yes Moisture Temperature / Pain No Abnormalities Noted: No Temperature: No Abnormality Dry / Scaly: No Maceration: No [name] Notes Wound #2 (Calcaneus) Wound Laterality: Left, Distal Cleanser Soap and Water Discharge Instruction: May shower and wash wound with  dial antibacterial soap and water prior to dressing change. Wound Cleanser Discharge Instruction: Cleanse the wound with wound cleanser prior to applying a clean dressing using gauze  sponges, not tissue or cotton balls. Peri-Wound Care Topical Primary Dressing epifix Discharge Instruction: applied by Haydon Dorris adaptic and steri-strips Chaudhuri, Sache [name] ([id number]) [id number] Page 7 of 7 Discharge Instruction: secured the epifix Secondary Dressing ABD Pad, 8x10 Discharge Instruction: Apply over primary dressing as directed. Woven Gauze Sponge, Non-Sterile 4x4 in Discharge Instruction: Apply over primary dressing as directed. Secured With [employer], 4.5x3.1 (in/yd) Discharge Instruction: Secure with Kerlix as directed. Compression Wrap Compression Stockings Add-Ons Electronic Signature(s) Signed: 01/28/2023 3:29:20 PM By: Redmond Pulling RN, BSN Entered By: Redmond Pulling on 01/26/2023 08:35:16 -------------------------------------------------------------------------------- Vitals Details Patient Name: Date of Service: [name]. 01/26/2023 8:15 A M Medical Record Number: [id number] Patient Account Number: [id number] Date of Birth/Sex: Treating RN: 1961-04-25 (61 y.o. [name] Primary Care Beza Steppe: [name] Other Clinician: Referring Baylon Santelli: Treating Malakai Schoenherr/Extender: [name]: 46 Vital Signs Time Taken: 08:14 Temperature (F): 98.2 Height (in): 66 Pulse (bpm): 92 Weight (lbs): 339 Respiratory Rate (breaths/min): 18 Body Mass Index (BMI): 54.7 Blood Pressure (mmHg): 122/60 Reference Range: 80 - 120 mg / dl Electronic Signature(s) Signed: 01/28/2023 3:29:20 PM By: Redmond Pulling RN, BSN Entered By: Redmond Pulling on 01/26/2023 08:15:01

## 2023-01-29 NOTE — Progress Notes (Signed)
[name], [name] ([id number]) [id number].pdf Page 1 of 11 Visit Report for 01/26/2023 Chief Complaint Document Details Patient Name: Date of Service: [name] 01/26/2023 8:15 [name] Medical Record Number: [id number] Patient Account Number: [id number] Date of Birth/Sex: Treating RN: 1961-03-28 (62 y.o. F) Primary Care Provider: [name] Other Clinician: Referring Provider: Treating Provider/Extender: [name] in Treatment: 78 Information Obtained from: Patient Chief Complaint 03/05/2022; left foot wound Electronic Signature(s) Signed: 01/26/2023 12:19:49 PM By: Geralyn Corwin DO Entered By: Geralyn Corwin on 01/26/2023 11:18:34 -------------------------------------------------------------------------------- Cellular or Tissue Based Product Details Patient Name: Date of Service: [name]. 01/26/2023 8:15 [name] Medical Record Number: [id number] Patient Account Number: [id number] Date of Birth/Sex: Treating RN: 1960-10-30 (61 y.o. [name] Primary Care Provider: [name] Other Clinician: Referring Provider: Treating Provider/Extender: [name] in Treatment: 46 Cellular or Tissue Based Product Type Wound #2 Left,Distal Calcaneus Applied to: Performed By: Physician Geralyn Corwin, DO Cellular or Tissue Based Product Type: Epifix Level of Consciousness (Pre-procedure): Awake and Alert Pre-procedure Verification/Time Out Yes - 08:55 Taken: Location: genitalia / hands / feet / multiple digits Wound Size (sq cm): 5 Product Size (sq cm): 6 Waste Size (sq cm): 0 Amount of Product Applied (sq cm): 6 Instrument Used: Forceps, Scissors Lot #: [id number] Expiration Date: 09/05/2027 Fenestrated: No Reconstituted: Yes Solution Type: normal saline Solution Amount: 2ml Lot #: [id number] Solution Expiration Date: 03/04/2025 Secured: Yes Secured With:  Steri-Strips Dressing Applied: Yes Primary Dressing: adaptic, gauze Procedural Pain: 0 Post Procedural Pain: 0 Response to Treatment: Procedure was tolerated well Level of Consciousness (Post- Awake and Alert procedure): Post Procedure Diagnosis Same as Pre-procedure Notes Scribed for Dr Mikey Bussing by Redmond Pulling, RN Electronic Signature(s) Halvorson, Paddy [name] ([id number]) [phone].pdf Page 2 of 11 Signed: 01/26/2023 9:33:30 AM By: Geralyn Corwin DO Signed: 01/28/2023 3:29:20 PM By: Redmond Pulling RN, BSN Entered By: Redmond Pulling on 01/26/2023 09:09:33 -------------------------------------------------------------------------------- Debridement Details Patient Name: Date of Service: [name] Bushy [name]. 01/26/2023 8:15 [name] Medical Record Number: [id number] Patient Account Number: [id number] Date of Birth/Sex: Treating RN: 1961-09-19 (61 y.o. [name], [name] Primary Care Provider: [name] Other Clinician: Referring Provider: Treating Provider/Extender: [name] in Treatment: 46 Debridement Performed for Assessment: Wound #2 Left,Distal Calcaneus Performed By: Physician Geralyn Corwin, DO Debridement Type: Debridement Severity of Tissue Pre Debridement: Fat layer exposed Level of Consciousness (Pre-procedure): Awake and Alert Pre-procedure Verification/Time Out Yes - 08:55 Taken: Start Time: 09:01 Pain Control: Lidocaine 4% T opical Solution Percent of Wound Bed Debrided: 100% T Area Debrided (cm): otal 3.92 Tissue and other material debrided: Non-Viable, Slough, Slough Level: Non-Viable Tissue Debridement Description: Selective/Open Wound Instrument: Curette Bleeding: Minimum Hemostasis Achieved: Pressure Procedural Pain: 0 Post Procedural Pain: 0 Response to Treatment: Procedure was tolerated well Level of Consciousness (Post- Awake and Alert procedure): Post Debridement Measurements of Total  Wound Length: (cm) 2 Width: (cm) 2.5 Depth: (cm) 0.1 Volume: (cm) 0.393 Character of Wound/Ulcer Post Debridement: Improved Severity of Tissue Post Debridement: Fat layer exposed Post Procedure Diagnosis Same as Pre-procedure Notes Scribed for Dr Mikey Bussing by Redmond Pulling, RN Electronic Signature(s) Signed: 01/26/2023 9:33:30 AM By: Geralyn Corwin DO Signed: 01/28/2023 3:29:20 PM By: Redmond Pulling RN, BSN Entered By: Redmond Pulling on 01/26/2023 09:04:56 -------------------------------------------------------------------------------- HPI Details Patient Name: Date of Service: [name]. 01/26/2023 8:15 [name] Medical Record Number: [id number] Patient Account Number: [id number] Date of Birth/Sex: Treating RN:  1961/02/02 (61 y.o. F) Primary Care Provider: [name] Other Clinician: Referring Provider: Treating Provider/Extender: [name] in Treatment: 46 History of Present Illness HPI Description: Admission 03/05/2022 [name] is [name] 62 year old female with [name] past medical history of uncontrolled insulin-dependent type 2 diabetes, tobacco user and chronic diastolic heart failure that presents to the clinic for [name] 61-month history of wound to her left heel. She states she had [name] left ankle fusion in September 2022. She states [name] ([id number]) [phone].pdf Page 3 of 11 that she always had [name] wound after the surgery and it never healed. She has home health and they have been doing compression wraps along with silver alginate to the wound bed. She currently denies signs of infection. 6/8; this is [name] 62 year old woman with type 2 diabetes. She developed [name] wound on her left Achilles heel just above the tip of the heel in the setting of recurrent ankle surgeries in late 2022. I have seen some of these results from either the cast or the surgical boots that are put on after these operations. She thinks this may be  the case. And [name] sense of pressure ulcer. In any case that she has been using Santyl Hydrofera Blue under compression. She is not wearing any footwear at home as she cannot find anything to accommodate the wrap 6/22; patient presents for follow-up. She has home health that comes out once [name] week to help with dressing changes. She has no issues or complaints today. We have been using Hydrofera Blue under compression therapy. 7/6; patient presents for follow-up. She states that home health did not come out for the past 2 weeks. It is unclear why. We have been using Hydrofera Blue and Santyl under compression therapy. 7/13; patient presents for follow-up. She did not take the oral antibiotics prescribed at last clinic visit. We have been using Hydrofera Blue with gentamicin/mupirocin ointment under compression therapy. She has no issues or complaints today. She denies signs of infection. 7/20; patient presents for follow-up. We have been using Hydrofera Blue with antibiotic ointment under 3 layer compression. She has home health that change the dressing once. They put collagen on the wound bed. She also reports falling yesterday and hitting her right foot. She has no pain to the area today. 7/27; patient presents for follow-up. We have been using Hydrofera Blue under 3 layer compression. She has home health that changes the dressing. She has no issues or complaints today. 8/3; patient presents for follow-up. We continues to use Hydrofera Blue under 3 layer compression. She has no issues or complaints today. She has been approved for Epicord at 100%. 8/11; patient presents for follow-up. We have been using Hydrofera Blue under 3 layer compression. She has been approved for Epicord and we have this today. She is in agreement with having this applied. 8/18; patient presents for follow-up. Epicord #1 was placed in standard fashion last clinic visit. She has no issues or complaints today. 9/18; Unfortunately  patient has missed her last clinic appointments due to falling and breaking her right ankle. We have been following her for her left ankle wound. She has also developed [name] large blister to the left heel over the past week that has ruptured and dried. She currently resides In [facility]. Wound9/25; patient presents for follow-up. We have been using Hydrofera Blue and Santyl to the original left ankle wound and Xeroform to the dried blistered. We have been wrapping her with compression therapy. She  resides in [name] facility and they changed the wrap once this past week. She has no issues or complaints today. She denies signs of infection. 10/3; patient presents for follow-up. We have been using Xeroform to the heel wound and Epicort to the left ankle wound All under compression therapy. She has no issues or complaints today. 10/10; patient presents for follow-up. We have been using Epicort to the left ankle wound and Santyl and Hydrofera Blue to the heel wound. All under compression therapy. she has no issues or complaints today. 10/16; patient presents for follow-up. Epicort was placed in standard fashion to the superior wound and onto the heel and Santyl and Hydrofera Blue. She states that the wrap got wet during her shower and her facility was able to rewrap with Kerlix/Coban. 10/23; patient presents for follow-up. Epicord was placed to the wound beds last clinic visit. We continue Kerlix/Coban. She has no issues or complaints today. 10/31; Patient presents for follow-up. Epicord was placed to the wound beds last clinic visit. The original wound is almost healed. We have done this under Kerlix/Coban. She denies signs of infection. 11/10; patient presents for follow-up. Patient's last epi cord was placed in standard fashion at last clinic visit. The original wound has healed. She still has [name] heel wound. Grafix was approved however too costly for the patient. We will rerun epi cord to see if she can  have more applications. She had done very well with this. We have been using Hydrofera Blue to the heel under Kerlix/Coban. 11/21; patient presents for follow-up. We have been using Hydrofera Blue and Santyl to the heel under Kerlix/Coban. She switched to [name] new healthcare insurance and again The skin substitute is too costly for the patient. She denies signs of infection. 11/28; patient presents for follow-up. Her facility did not change the wrap last clinic visit due to lack of staffing. The wrap was taken off and Hydrofera Blue dressing has been in place for the past week. She has no issues or complaints today. 12/5; patient presents for follow-up. She states she has had more drainage over the past week. They did not change the wrap at her facility. She states the nurse will be back this week. She is also been doing [name] lot of physical therapy. She has been using the Prevalon boot while in bed. 12/12; patient presents for follow-up. We have been using Hydrofera Blue with antibiotic ointment under 4-layer compression. She is tolerated the wrap well. She states she is using her Prevalon boot while in the wheelchair. She has no issues or complaints today. There is been improvement in wound healing. 12/19; patient presents for follow-up. We have been using Hydrofera Blue and antibiotic ointment to the wound bed under 4-layer compression. Her facility change the wrap once. She states she has been using the Prevalon boot in bed but not in the wheelchair due to issues with transferring. Overall there is still improvement in wound healing. 1/16; patient presents for follow-up. She missed her last clinic appointment. We have been using Hydrofera Blue and antibiotic ointment under 4-layer compression. At the facility this has only been changed twice over the past 3 weeks. They are not using 4-layer compression. She came in with Kerlix/Coban. 1/23; patient presents for follow-up. We have been using Santyl and  Hydrofera Blue under 4-layer compression. Patient has no issues or complaints today. 2/1; patient presents for follow-up. Patient's been using Santyl and Hydrofera Blue to the wound bed. She has developed more slough and  now [name] mild odor. She states she is wearing the Prevalon boot at night. It is unclear if she is offloading this area during the day. 2/13; patient presents for follow-up. Patient's been using Dakin's wet-to-dry dressings. She received her Keystone antibiotic ointment in the mail. She has not used this yet. She reports using her Prevalon boot. She has no issues or complaints today. 2/20; patient presents for follow-up. She has been using Keystone antibiotic ointment with Hydrofera Blue. She has no issues or complaints today. Due to transportation she cannot do the total contact cast today. She is scheduled for next week to have this placed. 2/27; patient presents for follow-up. Plan is for the total contact cast today. We have been using Keystone and Hydrofera Blue to the wound bed. She forgot her Keystone antibiotic ointment. She has no issues or complaints today. 2/29; patient presents for follow-up. Patient presents for her obligatory cast change. She has no issues or complaints today. 3/5; patient presents for follow-up. She has had the cast in place for the past week and has tolerated this well. She has no issues or complaints today. We [name], [name] ([id number]) [id number].pdf Page 4 of 11 have been using PolyMem with [facility] antibiotic ointment. 12/15/2022: The wound is quite clean and measured smaller today. 3/19; we have been using Keystone antibiotic ointment with PolyMem silver under the total contact cast. Overall there is been improvement in wound healing. 3/26; patient presents for follow-up. She is been approved for epi fix. She would like to proceed with this today. Previously we have been using Keystone antibiotic ointment with PolyMem silver  under the total contact cast. She has no issues or complaints. 01/05/2023: The wound is clean and she is tolerating total contact casting without difficulty. She had her first application of EpiFix last week. 4/9; patient presents for follow-up. We have been applying EpiFix under the total contact cast for the past 2 weeks. She is tolerated this well. She has no issues or complaints today. 4/16; patient presents for follow-up. We have been using EpiFix under the total contact cast. She has done well with this. Wound is smaller. She does have slough buildup. 4/23; patient presents for follow-up. We have been using EpiFix under the total contact cast. Wound appears well-healing. She does have slough buildup. Electronic Signature(s) Signed: 01/26/2023 12:19:49 PM By: Geralyn Corwin DO Entered By: Geralyn Corwin on 01/26/2023 11:18:56 -------------------------------------------------------------------------------- Physical Exam Details Patient Name: Date of Service: [name] Bushy [name]. 01/26/2023 8:15 [name] Medical Record Number: [id number] Patient Account Number: [id number] Date of Birth/Sex: Treating RN: 02/21/1961 (62 y.o. F) Primary Care Provider: [name] Other Clinician: Referring Provider: Treating Provider/Extender: [name] in Treatment: 46 Constitutional respirations regular, non-labored and within target range for patient.. Cardiovascular 2+ dorsalis pedis/posterior tibialis pulses. Psychiatric pleasant and cooperative. Notes Open wound with granulation tissue and nonviable tissue at the heel. No signs of surrounding infection. Electronic Signature(s) Signed: 01/26/2023 12:19:49 PM By: Geralyn Corwin DO Entered By: Geralyn Corwin on 01/26/2023 11:19:29 -------------------------------------------------------------------------------- Physician Orders Details Patient Name: Date of Service: [name] Bushy [name]. 01/26/2023 8:15 [name] Medical  Record Number: [id number] Patient Account Number: [id number] Date of Birth/Sex: Treating RN: [birth year] (62 y.o. [name] Primary Care Provider: [name] Other Clinician: Referring Provider: Treating Provider/Extender: [name] in Treatment: 40 Verbal / Phone Orders: No Diagnosis Coding ICD-10 Coding Code Description [phone] Non-pressure chronic ulcer of other part of left foot with fat  layer exposed E11.621 Type 2 diabetes mellitus with foot ulcer E66.01 Morbid (severe) obesity due to excess calories Follow-up Appointments ppointment in 1 week. - Dr. Mikey Bussing 02/02/2023 @ 0815 Return [name] ppointment in 2 weeks. - Dr. Mikey Bussing 02/08/23 @ 12:45 Return [name] Hoeppner, Alaze [name] ([id number]) [id number] Page 5 of 11 Other: - Leave dressing in place. Facility not to change dressing. Anesthetic (In clinic) Topical Lidocaine 5% applied to wound bed (In clinic) Topical Lidocaine 4% applied to wound bed Cellular or Tissue Based Products Cellular or Tissue Based Product Type: - Run IVR for EpiFix and Epicord=100% covered 05/07/22: Epicord ordered 12/08/2022: Run insurance for epicord and theraskin. 01/19/2023: Run for organogenesis- puraply AM daptic or Mepitel. (DO NOT REMOVE). - Cellular or Tissue Based Product applied to wound bed, secured with steri-strips, cover with [name] Epicord #1 05/15/25 Epicord #2 05/22/22 Epicord #3 06/29/2022 Epicord #4 07/07/2022 Epifix #5 07/14/22 Epicord #6 07/20/22 Epicord # 7 07/27/22 ***Run for Grafix***PENDING Epicord #8 08/04/2022 2024-IVR epifix approved 12/29/2022 #1 epifix applied to left heel. 01/05/2023 #2 epifix applied to left heel. 01/12/2023 #3 epifix applied to left heel 01/19/2023 #4 epifix applied to left heel. 01/26/23 Epifix #5 Bathing/ Shower/ Hygiene May shower with protection but do not get wound dressing(s) wet. Protect dressing(s) with water repellant cover (for example, large  plastic bag) or [name] cast cover and may then take shower. Edema Control - Lymphedema / SCD / Other Elevate legs to the level of the heart or above for 30 minutes daily and/or when sitting for 3-4 times [name] day throughout the day. Avoid standing for long periods of time. Off-Loading Total Contact Cast to Left Lower Extremity - size 4 Additional Orders / Instructions Follow Nutritious Diet - Monitor/Control Blood Sugar Wound Treatment Wound #2 - Calcaneus Wound Laterality: Left, Distal Cleanser: Soap and Water 1 x Per Week/30 Days Discharge Instructions: May shower and wash wound with dial antibacterial soap and water prior to dressing change. Cleanser: Wound Cleanser 1 x Per Week/30 Days Discharge Instructions: Cleanse the wound with wound cleanser prior to applying [name] clean dressing using gauze sponges, not tissue or cotton balls. Prim Dressing: epifix 1 x Per Week/30 Days ary Discharge Instructions: applied by provider Prim Dressing: adaptic and steri-strips 1 x Per Week/30 Days ary Discharge Instructions: secured the epifix Secondary Dressing: ABD Pad, 8x10 1 x Per Week/30 Days Discharge Instructions: Apply over primary dressing as directed. Secondary Dressing: Woven Gauze Sponge, Non-Sterile 4x4 in (Home Health) 1 x Per Week/30 Days Discharge Instructions: Apply over primary dressing as directed. Secured With: [employer], 4.5x3.1 (in/yd) 1 x Per Week/30 Days Discharge Instructions: Secure with Kerlix as directed. Patient Medications llergies: ceftriaxone [name] Notifications Medication Indication Start End 01/26/2023 lidocaine DOSE topical 4 % cream - cream topical once daily Electronic Signature(s) Signed: 01/26/2023 12:19:49 PM By: Geralyn Corwin DO Previous Signature: 01/26/2023 9:33:30 AM Version By: Geralyn Corwin DO [name], [name] ([id number]) [id number] Page 6 of 11 Entered By: Geralyn Corwin on 01/26/2023  11:19:39 -------------------------------------------------------------------------------- Problem List Details Patient Name: Date of Service: [name] 01/26/2023 8:15 [name] Medical Record Number: [id number] Patient Account Number: [id number] Date of Birth/Sex: Treating RN: [birth year] (62 y.o. [name] Primary Care Provider: [name] Other Clinician: Referring Provider: Treating Provider/Extender: [name] in Treatment: 35 Active Problems ICD-10 Encounter Code Description Active Date MDM Diagnosis [phone] Non-pressure chronic ulcer of other part of left foot with fat layer exposed 03/05/2022 No Yes E11.621 Type 2  diabetes mellitus with foot ulcer 03/05/2022 No Yes E66.01 Morbid (severe) obesity due to excess calories 03/05/2022 No Yes Inactive Problems Resolved Problems Electronic Signature(s) Signed: 01/26/2023 12:19:49 PM By: Geralyn Corwin DO Entered By: Geralyn Corwin on 01/26/2023 11:18:16 -------------------------------------------------------------------------------- Progress Note Details Patient Name: Date of Service: [name] Bushy [name]. 01/26/2023 8:15 [name] Medical Record Number: [id number] Patient Account Number: [id number] Date of Birth/Sex: Treating RN: 1961-03-25 (62 y.o. F) Primary Care Provider: [name] Other Clinician: Referring Provider: Treating Provider/Extender: [name] in Treatment: 46 Subjective Chief Complaint Information obtained from Patient 03/05/2022; left foot wound History of Present Illness (HPI) Admission 03/05/2022 Ms. Keali Freimark is [name] 62 year old female with [name] past medical history of uncontrolled insulin-dependent type 2 diabetes, tobacco user and chronic diastolic heart failure that presents to the clinic for [name] 39-month history of wound to her left heel. She states she had [name] left ankle fusion in September 2022. She states that she always had [name] wound after  the surgery and it never healed. She has home health and they have been doing compression wraps along with silver alginate to the wound bed. She currently denies signs of infection. 6/8; this is [name] 62 year old woman with type 2 diabetes. She developed [name] wound on her left Achilles heel just above the tip of the heel in the setting of recurrent ankle surgeries in late 2022. I have seen some of these results from either the cast or the surgical boots that are put on after these operations. She thinks this may be the case. And [name] sense of pressure ulcer. In any case that she has been using Santyl Hydrofera Blue under compression. She is not wearing any footwear at home as she cannot find anything to accommodate the wrap 6/22; patient presents for follow-up. She has home health that comes out once [name] week to help with dressing changes. She has no issues or complaints today. We have been using Hydrofera Blue under compression therapy. 7/6; patient presents for follow-up. She states that home health did not come out for the past 2 weeks. It is unclear why. We have been using Hydrofera Blue and Santyl under compression therapy. [name], [name] ([id number]) [id number] Page 7 of 11 7/13; patient presents for follow-up. She did not take the oral antibiotics prescribed at last clinic visit. We have been using Hydrofera Blue with gentamicin/mupirocin ointment under compression therapy. She has no issues or complaints today. She denies signs of infection. 7/20; patient presents for follow-up. We have been using Hydrofera Blue with antibiotic ointment under 3 layer compression. She has home health that change the dressing once. They put collagen on the wound bed. She also reports falling yesterday and hitting her right foot. She has no pain to the area today. 7/27; patient presents for follow-up. We have been using Hydrofera Blue under 3 layer compression. She has home health that changes the  dressing. She has no issues or complaints today. 8/3; patient presents for follow-up. We continues to use Hydrofera Blue under 3 layer compression. She has no issues or complaints today. She has been approved for Epicord at 100%. 8/11; patient presents for follow-up. We have been using Hydrofera Blue under 3 layer compression. She has been approved for Epicord and we have this today. She is in agreement with having this applied. 8/18; patient presents for follow-up. Epicord #1 was placed in standard fashion last clinic visit. She has no issues or complaints today. 9/18; Unfortunately patient has missed her  last clinic appointments due to falling and breaking her right ankle. We have been following her for her left ankle wound. She has also developed [name] large blister to the left heel over the past week that has ruptured and dried. She currently resides In [facility] [facility] Wound9/25; patient presents for follow-up. We have been using Hydrofera Blue and Santyl to the original left ankle wound and Xeroform to the dried blistered. We have been wrapping her with compression therapy. She resides in [name] and they changed the wrap once this past week. She has no issues or complaints today. She denies signs of infection. 10/3; patient presents for follow-up. We have been using Xeroform to the heel wound and Epicort to the left ankle wound All under compression therapy. She has no issues or complaints today. 10/10; patient presents for follow-up. We have been using Epicort to the left ankle wound and Santyl and Hydrofera Blue to the heel wound. All under compression therapy. she has no issues or complaints today. 10/16; patient presents for follow-up. Epicort was placed in standard fashion to the superior wound and onto the heel and Santyl and Hydrofera Blue. She states that the wrap got wet during her shower and her facility was able to rewrap with Kerlix/Coban. 10/23; patient presents for follow-up.  Epicord was placed to the wound beds last clinic visit. We continue Kerlix/Coban. She has no issues or complaints today. 10/31; Patient presents for follow-up. Epicord was placed to the wound beds last clinic visit. The original wound is almost healed. We have done this under Kerlix/Coban. She denies signs of infection. 11/10; patient presents for follow-up. Patient's last epi cord was placed in standard fashion at last clinic visit. The original wound has healed. She still has [name] heel wound. Grafix was approved however too costly for the patient. We will rerun epi cord to see if she can have more applications. She had done very well with this. We have been using Hydrofera Blue to the heel under Kerlix/Coban. 11/21; patient presents for follow-up. We have been using Hydrofera Blue and Santyl to the heel under Kerlix/Coban. She switched to [name] new healthcare insurance and again The skin substitute is too costly for the patient. She denies signs of infection. 11/28; patient presents for follow-up. Her facility did not change the wrap last clinic visit due to lack of staffing. The wrap was taken off and Hydrofera Blue dressing has been in place for the past week. She has no issues or complaints today. 12/5; patient presents for follow-up. She states she has had more drainage over the past week. They did not change the wrap at her facility. She states the nurse will be back this week. She is also been doing [name] lot of physical therapy. She has been using the Prevalon boot while in bed. 12/12; patient presents for follow-up. We have been using Hydrofera Blue with antibiotic ointment under 4-layer compression. She is tolerated the wrap well. She states she is using her Prevalon boot while in the wheelchair. She has no issues or complaints today. There is been improvement in wound healing. 12/19; patient presents for follow-up. We have been using Hydrofera Blue and antibiotic ointment to the wound bed under  4-layer compression. Her facility change the wrap once. She states she has been using the Prevalon boot in bed but not in the wheelchair due to issues with transferring. Overall there is still improvement in wound healing. 1/16; patient presents for follow-up. She missed her last clinic appointment. We  have been using Hydrofera Blue and antibiotic ointment under 4-layer compression. At the facility this has only been changed twice over the past 3 weeks. They are not using 4-layer compression. She came in with Kerlix/Coban. 1/23; patient presents for follow-up. We have been using Santyl and Hydrofera Blue under 4-layer compression. Patient has no issues or complaints today. 2/1; patient presents for follow-up. Patient's been using Santyl and Hydrofera Blue to the wound bed. She has developed more slough and now [name] mild odor. She states she is wearing the Prevalon boot at night. It is unclear if she is offloading this area during the day. 2/13; patient presents for follow-up. Patient's been using Dakin's wet-to-dry dressings. She received her Keystone antibiotic ointment in the mail. She has not used this yet. She reports using her Prevalon boot. She has no issues or complaints today. 2/20; patient presents for follow-up. She has been using Keystone antibiotic ointment with Hydrofera Blue. She has no issues or complaints today. Due to transportation she cannot do the total contact cast today. She is scheduled for next week to have this placed. 2/27; patient presents for follow-up. Plan is for the total contact cast today. We have been using Keystone and Hydrofera Blue to the wound bed. She forgot her Keystone antibiotic ointment. She has no issues or complaints today. 2/29; patient presents for follow-up. Patient presents for her obligatory cast change. She has no issues or complaints today. 3/5; patient presents for follow-up. She has had the cast in place for the past week and has tolerated this well.  She has no issues or complaints today. We have been using PolyMem with Keystone antibiotic ointment. 12/15/2022: The wound is quite clean and measured smaller today. 3/19; we have been using Keystone antibiotic ointment with PolyMem silver under the total contact cast. Overall there is been improvement in wound healing. 3/26; patient presents for follow-up. She is been approved for epi fix. She would like to proceed with this today. Previously we have been using Keystone antibiotic ointment with PolyMem silver under the total contact cast. She has no issues or complaints. 01/05/2023: The wound is clean and she is tolerating total contact casting without difficulty. She had her first application of EpiFix last week. 4/9; patient presents for follow-up. We have been applying EpiFix under the total contact cast for the past 2 weeks. She is tolerated this well. She has no issues or complaints today. [name], [name] ([id number]) [id number].pdf Page 8 of 11 4/16; patient presents for follow-up. We have been using EpiFix under the total contact cast. She has done well with this. Wound is smaller. She does have slough buildup. 4/23; patient presents for follow-up. We have been using EpiFix under the total contact cast. Wound appears well-healing. She does have slough buildup. Patient History Information obtained from Patient, Chart. Family History Cancer - Father,Mother, Diabetes - Mother,Maternal Grandparents,Paternal Grandparents, Heart Disease - Siblings,Maternal Grandparents, Hypertension - Siblings, Tuberculosis - Maternal Grandparents,Paternal Grandparents. Social History Current every day smoker, Marital Status - Single, Alcohol Use - Never, Drug Use - No History, Caffeine Use - Daily. 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 Vitals Time Taken: 8:14 AM, Height: 66 in, Weight: 339 lbs, BMI: 54.7, Temperature: 98.2 F, Pulse: 92 bpm, Respiratory Rate: 18 breaths/min, Blood Pressure: 122/60 mmHg. Cardiovascular 2+ dorsalis pedis/posterior tibialis pulses. Psychiatric pleasant and cooperative. General Notes: Open wound with granulation tissue and nonviable tissue at the heel. No signs of surrounding infection. Integumentary (Hair, Skin) Wound #2 status is Open. Original cause of wound was Blister. The date acquired was: 06/15/2022. The wound has been in treatment 30 weeks. The wound is located on the Left,Distal Calcaneus. The wound measures 2cm length x 2.5cm width x 0.1cm depth; 3.927cm^2 area and 0.393cm^3 volume. There is Fat Layer (Subcutaneous Tissue) exposed. There is no tunneling or undermining noted. There is [name] medium amount of serosanguineous drainage noted. The wound margin is distinct with the outline attached to the wound base. There is large (67-100%) red, pink, hyper - granulation within the wound bed. There is [name] small (1- 33%) amount of necrotic tissue within the wound bed including Adherent Slough. The periwound skin appearance had no abnormalities noted for texture. The periwound skin appearance had no abnormalities noted for color. The periwound skin appearance did not exhibit: Dry/Scaly, Maceration. Periwound temperature was noted as No Abnormality. Assessment Active Problems ICD-10 Non-pressure chronic ulcer of  other part of left foot with fat layer exposed Type 2 diabetes mellitus with foot ulcer Morbid (severe) obesity due to excess calories [name], [name] ([id number]) [id number] Page 9 of 11 Patient's wound has shown improvement in size and appearance since last clinic visit. I debrided nonviable tissue. Epi fix was placed in standard fashion. T contact cast was continued. otal Procedures Wound #2 Pre-procedure diagnosis of Wound #2 is [name] Diabetic Wound/Ulcer of the Lower Extremity located on the Left,Distal Calcaneus .Severity of Tissue Pre Debridement is: Fat layer exposed. There was [name] Selective/Open Wound Non-Viable Tissue Debridement with [name] total area of 3.92 sq cm performed by Geralyn Corwin, DO. With the following instrument(s): Curette to remove Non-Viable tissue/material. Material removed includes [facility] after achieving pain control using Lidocaine 4% Topical Solution. No specimens were taken. [name] time out was conducted at 08:55, prior to the start of the procedure. [name] Minimum amount of bleeding was controlled with Pressure. The procedure was tolerated well with [name] pain level of 0 throughout and [name] pain level of 0 following the procedure. Post Debridement Measurements: 2cm length x 2.5cm width x 0.1cm depth; 0.393cm^3 volume. Character of Wound/Ulcer Post Debridement is improved. Severity of Tissue Post Debridement is: Fat layer exposed. Post procedure Diagnosis Wound #2: Same as Pre-Procedure General Notes: Scribed for Dr Mikey Bussing by Redmond Pulling, RN. Pre-procedure diagnosis of Wound #2 is [name] Diabetic Wound/Ulcer of the Lower Extremity located on the Left,Distal Calcaneus. [name] skin graft procedure using [name] bioengineered skin substitute/cellular or tissue based product was performed by Geralyn Corwin, DO with the following instrument(s): Forceps and Scissors. Epifix was applied and secured with Steri-Strips. 6 sq cm of product was utilized and 0 sq cm was wasted. Post  Application, adaptic, gauze was applied. [name] Time Out was conducted at 08:55, prior to the start of the procedure. The procedure was tolerated well with [name] pain level of 0 throughout and [name] pain level of 0 following the procedure. Post procedure Diagnosis Wound #2: Same as Pre-Procedure General Notes: Scribed for Dr Mikey Bussing by Redmond Pulling, RN. Pre-procedure diagnosis of Wound #2 is [name] Diabetic Wound/Ulcer of the Lower Extremity  located on the Left,Distal Calcaneus . There was [name] T Research scientist (life sciences) otal Procedure by Geralyn Corwin, DO. Post procedure Diagnosis Wound #2: Same as Pre-Procedure Plan Follow-up Appointments: Return Appointment in 1 week. - Dr. Mikey Bussing 02/02/2023 @ 0815 Return Appointment in 2 weeks. - Dr. Mikey Bussing 02/08/23 @ 12:45 Other: - Leave dressing in place. Facility not to change dressing. Anesthetic: (In clinic) Topical Lidocaine 5% applied to wound bed (In clinic) Topical Lidocaine 4% applied to wound bed Cellular or Tissue Based Products: Cellular or Tissue Based Product Type: - Run IVR for EpiFix and Epicord=100% covered 05/07/22: Epicord ordered 12/08/2022: Run insurance for epicord and theraskin. 01/19/2023: Run for organogenesis- puraply AM Cellular or Tissue Based Product applied to wound bed, secured with steri-strips, cover with Adaptic or Mepitel. (DO NOT REMOVE). - Epicord #1 05/15/25 Epicord #2 05/22/22 Epicord #3 06/29/2022 Epicord #4 07/07/2022 Epifix #5 07/14/22 Epicord #6 07/20/22 Epicord # 7 07/27/22 ***Run for Grafix***PENDING Epicord #8 08/04/2022 2024-IVR epifix approved 12/29/2022 #1 epifix applied to left heel. 01/05/2023 #2 epifix applied to left heel. 01/12/2023 #3 epifix applied to left heel 01/19/2023 #4 epifix applied to left heel. 01/26/23 Epifix #5 Bathing/ Shower/ Hygiene: May shower with protection but do not get wound dressing(s) wet. Protect dressing(s) with water repellant cover (for example, large plastic bag) or [name] cast cover and may then take shower. Edema  Control - Lymphedema / SCD / Other: Elevate legs to the level of the heart or above for 30 minutes daily and/or when sitting for 3-4 times [name] day throughout the day. Avoid standing for long periods of time. Off-Loading: T Contact Cast to Left Lower Extremity - size 4 otal Additional Orders / Instructions: Follow Nutritious Diet - Monitor/Control Blood Sugar The following medication(s) was prescribed: lidocaine topical 4 % cream cream topical once daily was prescribed at facility WOUND #2: - Calcaneus Wound Laterality: Left, Distal Cleanser: Soap and Water 1 x Per Week/30 Days Discharge Instructions: May shower and wash wound with dial antibacterial soap and water prior to dressing change. Cleanser: Wound Cleanser 1 x Per Week/30 Days Discharge Instructions: Cleanse the wound with wound cleanser prior to applying [name] clean dressing using gauze sponges, not tissue or cotton balls. Prim Dressing: epifix 1 x Per Week/30 Days ary Discharge Instructions: applied by provider Prim Dressing: adaptic and steri-strips 1 x Per Week/30 Days ary Discharge Instructions: secured the epifix Secondary Dressing: ABD Pad, 8x10 1 x Per Week/30 Days Discharge Instructions: Apply over primary dressing as directed. Secondary Dressing: Woven Gauze Sponge, Non-Sterile 4x4 in (Home Health) 1 x Per Week/30 Days Discharge Instructions: Apply over primary dressing as directed. Secured With: [employer], 4.5x3.1 (in/yd) 1 x Per Week/30 Days Discharge Instructions: Secure with Kerlix as directed. 1. In office sharp debridement 2. Epi fix placed in standard fashion 3. T contact cast placed in standard fashion otal 4. Follow-up in 1 week Electronic Signature(s) [name], [name] [phone].pdf Page 10 of 11 Signed: 01/26/2023 12:19:49 PM By: Geralyn Corwin DO Entered By: Geralyn Corwin on 01/26/2023  11:21:15 -------------------------------------------------------------------------------- HxROS Details Patient Name: Date of Service: [name], [name]. 01/26/2023 8:15 [name] Medical Record Number: [id number] Patient Account Number: [id number] Date of Birth/Sex: Treating RN: 03/24/1961 (62 y.o. F) Primary Care Provider: [name] Other Clinician: Referring Provider: Treating Provider/Extender: [name] in Treatment: 46 Information Obtained From Patient Chart Hematologic/Lymphatic Medical History: Positive for: Anemia Respiratory Medical History: Positive for: Chronic Obstructive Pulmonary Disease (COPD) Cardiovascular Medical History: Positive  for: Congestive Heart Failure - Weighs self daily; Hypertension; Myocardial Infarction; Peripheral Venous Disease Gastrointestinal Medical History: Past Medical History Notes: GERD, Peptic Ulcer Disease Endocrine Medical History: Positive for: Type II Diabetes Time with diabetes: 16 years Treated with: Insulin, Oral agents Blood sugar tested every day: Yes Tested : Twice daily Musculoskeletal Medical History: Past Medical History Notes: Broke left leg 3 years ago, 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, foot fused Neurologic Medical History: Positive for: Neuropathy Psychiatric Medical History: Past Medical History Notes: Depression Immunizations Pneumococcal Vaccine: Received Pneumococcal Vaccination: No Implantable Devices None Family and Social History Cancer: Yes - Father,Mother; Diabetes: Yes - Mother,Maternal Grandparents,Paternal Grandparents; Heart Disease: Yes - Siblings,Maternal Grandparents; Hypertension: Yes - Siblings; Tuberculosis: Yes - Maternal Grandparents,Paternal Grandparents; Current every day smoker; Marital Status - Single; Alcohol Use: Never; Drug Use: No History; Caffeine Use: Daily; Financial Concerns: No; Food, Clothing or Shelter Needs: No; Support System Lacking:  No; Transportation Concerns: No [name], [name] ([id number]) [phone].pdf Page 11 of 11 Electronic Signature(s) Signed: 01/26/2023 12:19:49 PM By: Geralyn Corwin DO Entered By: Geralyn Corwin on 01/26/2023 11:19:02 -------------------------------------------------------------------------------- Total Contact Cast Details Patient Name: Date of Service: [name]. 01/26/2023 8:15 [name] Medical Record Number: [id number] Patient Account Number: [id number] Date of Birth/Sex: Treating RN: [birth year] (62 y.o. [name] Primary Care Provider: [name] Other Clinician: Referring Provider: Treating Provider/Extender: [name] in Treatment: 02 T Contact Cast Applied for Wound Assessment: otal Wound #2 Left,Distal Calcaneus Performed By: Physician Geralyn Corwin, DO Post Procedure Diagnosis Same as Pre-procedure Electronic Signature(s) Signed: 01/26/2023 9:33:30 AM By: Geralyn Corwin DO Signed: 01/28/2023 3:29:20 PM By: Redmond Pulling RN, BSN Entered By: Redmond Pulling on 01/26/2023 09:09:49 -------------------------------------------------------------------------------- SuperBill Details Patient Name: Date of Service: [name]. 01/26/2023 Medical Record Number: [id number] Patient Account Number: [id number] Date of Birth/Sex: Treating RN: [birth year] (61 y.o. [name] Primary Care Provider: [name] Other Clinician: Referring Provider: Treating Provider/Extender: [name] in Treatment: 46 Diagnosis Coding ICD-10 Codes Code Description [id number] Non-pressure chronic ulcer of other part of left foot with fat layer exposed E11.621 Type 2 diabetes mellitus with foot ulcer E66.01 Morbid (severe) obesity due to excess calories Facility Procedures : CPT4 Code: [id number] Description: Q4186 Epifix 2cm x 3cm Modifier: Quantity: 6 : CPT4 Code:  [id number] Description: 15275 - SKIN SUB GRAFT FACE/NK/HF/G ICD-10 Diagnosis Description L97.522 Non-pressure chronic ulcer of 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

## 2023-02-02 ENCOUNTER — Encounter (HOSPITAL_BASED_OUTPATIENT_CLINIC_OR_DEPARTMENT_OTHER): Payer: 59 | Admitting: Internal Medicine

## 2023-02-02 DIAGNOSIS — L97522 Non-pressure chronic ulcer of other part of left foot with fat layer exposed: Secondary | ICD-10-CM

## 2023-02-02 DIAGNOSIS — E11621 Type 2 diabetes mellitus with foot ulcer: Secondary | ICD-10-CM

## 2023-02-08 ENCOUNTER — Encounter (HOSPITAL_BASED_OUTPATIENT_CLINIC_OR_DEPARTMENT_OTHER): Payer: 59 | Attending: Internal Medicine | Admitting: Internal Medicine

## 2023-02-08 DIAGNOSIS — I110 Hypertensive heart disease with heart failure: Secondary | ICD-10-CM | POA: Diagnosis not present

## 2023-02-08 DIAGNOSIS — J449 Chronic obstructive pulmonary disease, unspecified: Secondary | ICD-10-CM | POA: Insufficient documentation

## 2023-02-08 DIAGNOSIS — L97522 Non-pressure chronic ulcer of other part of left foot with fat layer exposed: Secondary | ICD-10-CM | POA: Diagnosis not present

## 2023-02-08 DIAGNOSIS — Z794 Long term (current) use of insulin: Secondary | ICD-10-CM | POA: Diagnosis not present

## 2023-02-08 DIAGNOSIS — I5032 Chronic diastolic (congestive) heart failure: Secondary | ICD-10-CM | POA: Insufficient documentation

## 2023-02-08 DIAGNOSIS — E1140 Type 2 diabetes mellitus with diabetic neuropathy, unspecified: Secondary | ICD-10-CM | POA: Insufficient documentation

## 2023-02-08 DIAGNOSIS — E11621 Type 2 diabetes mellitus with foot ulcer: Secondary | ICD-10-CM | POA: Insufficient documentation

## 2023-02-09 NOTE — Progress Notes (Signed)
Gutzmer, Lakira C ([id number]) [id number] Page 1 of 7 Visit Report for 02/08/2023 Arrival Information Details Patient Name: Date of Service: [name] 02/08/2023 12:45 PM Medical Record Number: [id number] Patient Account Number: [id number] Date of Birth/Sex: Treating RN: July 19, 1961 (62 y.o. F) Primary Care Mattalynn Crandle: [name] Other Clinician: Referring Kimberleigh Mehan: Treating Macguire Holsinger/Extender: Renaldo Reel in Treatment: 48 Visit Information History Since Last Visit Added or deleted any medications: No Patient Arrived: Wheel Chair Any new allergies or adverse reactions: No Arrival Time: 12:44 Had a fall or experienced change in No Accompanied By: self activities of daily living that may affect Transfer Assistance: None risk of falls: Patient Identification Verified: Yes Signs or symptoms of abuse/neglect since last No Secondary Verification Process Completed: Yes visito Patient Requires Transmission-Based Precautions: No Hospitalized since last visit: No Patient Has Alerts: No Implantable device outside of the clinic No excluding cellular tissue based products placed in the center since last visit: Has Dressing in Place as Prescribed: Yes Has Footwear/Offloading in Place as Yes Prescribed: Left: Removable Cast Walker/Walking Boot T Contact Cast otal Pain Present Now: No Electronic Signature(s) Signed: 02/08/2023 4:50:46 PM By: Thayer Dallas Entered By: Thayer Dallas on 02/08/2023 12:54:15 -------------------------------------------------------------------------------- Encounter Discharge Information Details Patient Name: Date of Service: [name] Bushy C. 02/08/2023 12:45 PM Medical Record Number: [id number] Patient Account Number: [id number] Date of Birth/Sex: Treating RN: 09/07/1961 (61 y.o. [name] Primary Care Jemell Town: [name] Other Clinician: Referring Arley Garant: Treating  Juddson Cobern/Extender: Renaldo Reel in Treatment: 48 Encounter Discharge Information Items Post Procedure Vitals Schedule Follow-up Appointment: No Temperature (F): 98.4 Clinical Summary of Care: Pulse (bpm): 92 Respiratory Rate (breaths/min): 18 Blood Pressure (mmHg): 142/72 Electronic Signature(s) Signed: 02/08/2023 5:08:59 PM By: Shawn Stall RN, BSN Entered By: Shawn Stall on 02/08/2023 13:40:03 -------------------------------------------------------------------------------- Lower Extremity Assessment Details Patient Name: Date of Service: [name] [name] C. 02/08/2023 12:45 PM Medical Record Number: [id number] Patient Account Number: [id number] Date of Birth/Sex: Treating RN: Jun 19, 1961 (61 y.o. F) Primary Care Jashad Depaula: [name] Other Clinician: Referring Mckenley Birenbaum: Treating Carloyn Lahue/Extender: Renaldo Reel in Treatment: [address] [place] C ([id number]) [id number] Page 2 of 7 Edema Assessment Assessed: [Left: No] [Right: No] Edema: [Left: Ye] [Right: s] Calf Left: Right: Point of Measurement: 28 cm From Medial Instep 49 cm Ankle Left: Right: Point of Measurement: 3 cm From Medial Instep 32.2 cm Electronic Signature(s) Signed: 02/08/2023 4:50:46 PM By: Thayer Dallas Entered By: Thayer Dallas on 02/08/2023 13:07:51 -------------------------------------------------------------------------------- Multi Wound Chart Details Patient Name: Date of Service: [name] Bushy C. 02/08/2023 12:45 PM Medical Record Number: [id number] Patient Account Number: [id number] Date of Birth/Sex: Treating RN: Jan 11, 1961 (61 y.o. 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: Extremity Anemia, Chronic Obstructive N/A N/A Comorbid History: Pulmonary Disease (COPD), Congestive Heart Failure, Hypertension, Myocardial Infarction, Peripheral Venous Disease, Type II Diabetes, Neuropathy 06/15/2022 N/A N/A Date Acquired: 106 N/A N/A Weeks of Treatment: Open N/A N/A Wound Status: No N/A N/A Wound Recurrence: 1.8x2x0.1 N/A N/A Measurements L x W x D (cm) 2.827 N/A N/A A (cm) : rea 0.283 N/A N/A Volume (cm) : 80.00% N/A N/A %  Reduction in A rea: 80.00% N/A N/A % Reduction in Volume: Grade 2 N/A N/A Classification: Medium N/A N/A Exudate A mount: Serosanguineous N/A N/A Exudate Type: red, brown N/A N/A Exudate Color: Distinct, outline attached N/A N/A Wound Margin: Large (67-100%) N/A N/A Granulation A mount: Red, Pink, Hyper-granulation N/A N/A Granulation Quality: Small (1-33%) N/A N/A Necrotic A mount: Fat Layer (Subcutaneous Tissue): Yes N/A N/A Exposed Structures: Fascia: No Zechman, Nour C ([id number]) [id number] Page 3 of 7 Tendon: No Muscle: No Joint: No Bone: No Medium (34-66%) N/A N/A Epithelialization: Debridement - Excisional N/A N/A Debridement: Pre-procedure Verification/Time Out 13:25 N/A N/A Taken: Lidocaine 4% Topical Solution N/A N/A Pain Control: Subcutaneous, Slough N/A N/A Tissue Debrided: Skin/Subcutaneous Tissue N/A N/A Level: 2.83 N/A N/A Debridement A (sq cm): rea Curette N/A N/A Instrument: Minimum N/A N/A Bleeding: Pressure N/A N/A Hemostasis A chieved: 0 N/A N/A Procedural Pain: 0 N/A N/A Post Procedural Pain: Procedure was tolerated well N/A N/A Debridement Treatment Response: 1.8x2x0.1 N/A N/A Post Debridement Measurements L x W x D  (cm) 0.283 N/A N/A Post Debridement Volume: (cm) No Abnormalities Noted N/A N/A Periwound Skin Texture: Maceration: No N/A N/A Periwound Skin Moisture: Dry/Scaly: No No Abnormalities Noted N/A N/A Periwound Skin Color: No Abnormality N/A N/A Temperature: Cellular or Tissue Based Product N/A N/A Procedures Performed: Debridement T Contact Cast otal Treatment Notes Wound #2 (Calcaneus) Wound Laterality: Left, Distal Cleanser Soap and Water Discharge Instruction: May shower and wash wound with dial antibacterial soap and water prior to dressing change. Wound Cleanser Discharge Instruction: Cleanse the wound with wound cleanser prior to applying a clean dressing using gauze sponges, not tissue or cotton balls. Peri-Wound Care Topical Primary Dressing epifix Discharge Instruction: applied by Shaquan Puerta adaptic and steri-strips Discharge Instruction: secured the epifix Secondary Dressing ABD Pad, 8x10 Discharge Instruction: Apply over primary dressing as directed. Woven Gauze Sponge, Non-Sterile 4x4 in Discharge Instruction: Apply over primary dressing as directed. Secured With [employer], 4.5x3.1 (in/yd) Discharge Instruction: Secure with Kerlix as directed. Compression Wrap Compression Stockings Add-Ons Electronic Signature(s) Signed: 02/08/2023 4:51:59 PM By: Baltazar Najjar MD Entered By: Baltazar Najjar on 02/08/2023 13:55:22 -------------------------------------------------------------------------------- Multi-Disciplinary Care Plan Details Patient Name: Date of Service: [name] Bushy C. 02/08/2023 12:45 PM Medical Record Number: [id number] Patient Account Number: [id number] Felber, Sandrine C ([id number]) [phone].pdf Page 4 of 7 Date of Birth/Sex: Treating RN: 23-May-1961 (61 y.o. [name] Primary Care Ziggy Chanthavong: Other Clinician: [name] Referring Khloey Chern: Treating Maleni Seyer/Extender: Renaldo Reel in Treatment: 48 Active Inactive Abuse / Safety / Falls / Self Care Management Nursing Diagnoses: History of Falls Goals: Patient will not experience any injury related to falls Date Initiated: 03/05/2022 Target Resolution Date: 03/05/2023 Goal Status: Active Interventions: Assess fall risk on admission and as needed Assess: immobility, friction, shearing, incontinence upon admission and as needed Assess impairment of mobility on admission and as needed per policy Notes: 04/30/22: Fall prevention ongoing, recent fall. Wound/Skin Impairment Nursing Diagnoses: Impaired tissue integrity Goals: Patient/caregiver will verbalize understanding of skin care regimen Date Initiated: 03/05/2022 Target Resolution Date: 03/05/2023 Goal Status: Active Ulcer/skin breakdown will have a volume reduction of 30% by week 4 Date Initiated: 03/05/2022 Date Inactivated: 04/30/2022 Target Resolution Date: 05/02/2022 Goal Status: Met Ulcer/skin breakdown will have a volume reduction of 50% by week 8 Date Initiated: 04/30/2022 Date Inactivated: 07/07/2022 Target Resolution Date: 07/04/2022 Unmet Reason: patient sent three Goal Status: Unmet weeks inpatient and rehab before returning to wound center. Interventions: Assess patient/caregiver  ability to obtain necessary supplies Assess patient/caregiver ability to perform ulcer/skin care regimen upon admission and as needed Assess ulceration(s) every visit Provide education on smoking Provide education on ulcer and skin care Treatment Activities: Topical wound management initiated : 03/05/2022 Notes: 04/30/22: Wound care regimen continues. Electronic Signature(s) Signed: 02/08/2023 5:08:59 PM By: Shawn Stall RN, BSN Entered By: Shawn Stall on 02/08/2023 13:39:04 -------------------------------------------------------------------------------- Pain Assessment Details Patient Name: Date of Service: [name] Bushy C. 02/08/2023 12:45 PM Medical  Record Number: [id number] Patient Account Number: [id number] Date of Birth/Sex: Treating RN: 07-Jul-1961 (61 y.o. F) Primary Care Atleigh Gruen: [name] Other Clinician: Referring Rylan Kaufmann: Treating Emilo Gras/Extender: Renaldo Reel in Treatment: 48 Active Problems Ibbotson, Margery C ([id number]) [id number] Page 5 of 7 Location of Pain Severity and Description of Pain Patient Has Paino No Site Locations Pain Management and Medication Current Pain Management: Electronic Signature(s) Signed: 02/08/2023 4:50:46 PM By: Thayer Dallas Entered By: Thayer Dallas on 02/08/2023 12:54:50 -------------------------------------------------------------------------------- Patient/Caregiver Education Details Patient Name: Date of Service: [name] 5/6/2024andnbsp12:45 PM Medical Record Number: [id number] Patient Account Number: [id number] Date of Birth/Gender: Treating RN: [birth year] (62 y.o. [name], [name] Primary Care Physician: [name] Other Clinician: Referring Physician: Treating Physician/Extender: Renaldo Reel in Treatment: 48 Education Assessment Education Provided To: Patient Education Topics Provided Wound/Skin Impairment: Handouts: Caring for Your Ulcer Methods: Explain/Verbal Responses: Reinforcements needed Electronic Signature(s) Signed: 02/08/2023 5:08:59 PM By: Shawn Stall RN, BSN Entered By: Shawn Stall on 02/08/2023 13:39:17 -------------------------------------------------------------------------------- Wound Assessment Details Patient Name: Date of Service: [name] Bushy C. 02/08/2023 12:45 PM Medical Record Number: [id number] Patient Account Number: [id number] Date of Birth/Sex: Treating RN: 05/19/1961 (61 y.o. F) Primary Care Wendee Hata: [name] Other Clinician: Referring Jayshun Galentine: Treating [name]/Extender: Renaldo Reel in  Treatment: 48 Wound Status Wound Number: 2 Primary Diabetic Wound/Ulcer of the Lower Extremity Etiology: Jefferys, Nur C ([id number]) [phone].pdf Page 6 of 7 Etiology: Wound Location: Left, Distal Calcaneus Wound Open Wounding Event: Blister Status: Date Acquired: 06/15/2022 Comorbid Anemia, Chronic Obstructive Pulmonary Disease (COPD), Weeks Of Treatment: 32 History: Congestive Heart Failure, Hypertension, Myocardial Infarction, Clustered Wound: No Peripheral Venous Disease, Type II Diabetes, Neuropathy Photos Wound Measurements Length: (cm) 1.8 Width: (cm) 2 Depth: (cm) 0.1 Area: (cm) 2.827 Volume: (cm) 0.283 % Reduction in Area: 80% % Reduction in Volume: 80% Epithelialization: Medium (34-66%) Tunneling: No Undermining: No Wound Description Classification: Grade 2 Wound Margin: Distinct, outline attached Exudate Amount: Medium Exudate Type: Serosanguineous Exudate Color: red, brown Foul Odor After Cleansing: No Slough/Fibrino Yes Wound Bed Granulation Amount: Large (67-100%) Exposed Structure Granulation Quality: Red, Pink, Hyper-granulation Fascia Exposed: No Necrotic Amount: Small (1-33%) Fat Layer (Subcutaneous Tissue) Exposed: Yes Necrotic Quality: Adherent Slough Tendon Exposed: No Muscle Exposed: No Joint Exposed: No Bone Exposed: No Periwound Skin Texture Texture Color No Abnormalities Noted: Yes No Abnormalities Noted: Yes Moisture Temperature / Pain No Abnormalities Noted: No Temperature: No Abnormality Dry / Scaly: No Maceration: No Treatment Notes Wound #2 (Calcaneus) Wound Laterality: Left, Distal Cleanser Soap and Water Discharge Instruction: May shower and wash wound with dial antibacterial soap and water prior to dressing change. Wound Cleanser Discharge Instruction: Cleanse the wound with wound cleanser prior to applying a clean dressing using gauze sponges, not tissue or cotton balls. Peri-Wound  Care Topical Primary Dressing epifix Discharge Instruction: applied by Leighann Amadon adaptic and steri-strips Discharge Instruction: secured the epifix Secondary Dressing ABD Pad, 8x10 Raap, Havilah C ([id number]) [phone].pdf Page 7 of  7 Discharge Instruction: Apply over primary dressing as directed. Woven Gauze Sponge, Non-Sterile 4x4 in Discharge Instruction: Apply over primary dressing as directed. Secured With [employer], 4.5x3.1 (in/yd) Discharge Instruction: Secure with Kerlix as directed. Compression Wrap Compression Stockings Add-Ons Electronic Signature(s) Signed: 02/08/2023 4:50:46 PM By: Thayer Dallas Entered By: Thayer Dallas on 02/08/2023 13:11:51 -------------------------------------------------------------------------------- Vitals Details Patient Name: Date of Service: [name] Bushy C. 02/08/2023 12:45 PM Medical Record Number: [id number] Patient Account Number: [id number] Date of Birth/Sex: Treating RN: Dec 09, 1960 (61 y.o. F) Primary Care Alcus Bradly: [name] Other Clinician: Referring Timesha Cervantez: Treating Lavern Maslow/Extender: Renaldo Reel in Treatment: 48 Vital Signs Time Taken: 12:54 Temperature (F): 98.4 Height (in): 66 Pulse (bpm): 92 Weight (lbs): 339 Respiratory Rate (breaths/min): 18 Body Mass Index (BMI): 54.7 Blood Pressure (mmHg): 142/72 Reference Range: 80 - 120 mg / dl Electronic Signature(s) Signed: 02/08/2023 4:50:46 PM By: Thayer Dallas Entered By: Thayer Dallas on 02/08/2023 12:54:38

## 2023-02-09 NOTE — Progress Notes (Signed)
Frerichs, Laya [name] ([id number]) [id number].pdf Page 1 of 9 Visit Report for 02/08/2023 Cellular or Tissue Based Product Details Patient Name: Date of Service: [name] 02/08/2023 12:45 PM Medical Record Number: [id number] Patient Account Number: [id number] Date of Birth/Sex: Treating RN: May 24, 1961 (62 y.o. F) Primary Care Provider: [name] Other Clinician: Referring Provider: Treating Provider/Extender: Renaldo Reel in Treatment: 48 Cellular or Tissue Based Product Type Wound #2 Left,Distal Calcaneus Applied to: Performed By: Physician Maxwell Caul., MD Cellular or Tissue Based Product Type: Epifix Level of Consciousness (Pre-procedure): Awake and Alert Pre-procedure Verification/Time Out Yes - 13:31 Taken: Location: genitalia / hands / feet / multiple digits Wound Size (sq cm): 3.6 Product Size (sq cm): 6 Waste Size (sq cm): 0 Amount of Product Applied (sq cm): 6 Instrument Used: Forceps, Scissors Lot #: [id number] Order #: 7 Expiration Date: 10/06/2027 Fenestrated: No Reconstituted: Yes Solution Type: Normal saline Solution Amount: 3 mL Lot #: [id number] Solution Expiration Date: 03/04/2025 Secured: Yes Secured With: Steri-Strips, adaptic Dressing Applied: No Procedural Pain: 0 Post Procedural Pain: 0 Response to Treatment: Procedure was tolerated well Level of Consciousness (Post- Awake and Alert procedure): Post Procedure Diagnosis Same as Pre-procedure Electronic Signature(s) Signed: 02/08/2023 4:51:59 PM By: Baltazar Najjar MD Entered By: Baltazar Najjar on 02/08/2023 13:56:04 -------------------------------------------------------------------------------- Debridement Details Patient Name: Date of Service: [name] Bushy [name]. 02/08/2023 12:45 PM Medical Record Number: [id number] Patient Account Number: [id number] Date of Birth/Sex: Treating RN: 02/18/1961 (61 y.o. F) Primary Care Provider:  [name] Other Clinician: Referring Provider: Treating Provider/Extender: Renaldo Reel in Treatment: 48 Debridement Performed for Assessment: Wound #2 Left,Distal Calcaneus Performed By: Physician Maxwell Caul., MD Debridement Type: Debridement Severity of Tissue Pre Debridement: Fat layer exposed Level of Consciousness (Pre-procedure): Awake and Alert Pre-procedure Verification/Time Out Yes - 13:25 Taken: Start Time: 13:26 Pain Control: Lidocaine 4% T opical Solution Percent of Wound Bed Debrided: 100% T Area Debrided (cm): otal 2.83 Tissue and other material debrided: Viable, Non-Viable, Slough, Subcutaneous, Skin: Dermis , Skin: Epidermis, Biofilm, Slough Level: Skin/Subcutaneous Tissue Sowle, [name] ([id number]) [id number].pdf Page 2 of 9 Debridement Description: Excisional Instrument: Curette Bleeding: Minimum Hemostasis Achieved: Pressure End Time: 13:30 Procedural Pain: 0 Post Procedural Pain: 0 Response to Treatment: Procedure was tolerated well Level of Consciousness (Post- Awake and Alert procedure): Post Debridement Measurements of Total Wound Length: (cm) 1.8 Width: (cm) 2 Depth: (cm) 0.1 Volume: (cm) 0.283 Character of Wound/Ulcer Post Debridement: Improved Severity of Tissue Post Debridement: Fat layer exposed Post Procedure Diagnosis Same as Pre-procedure Electronic Signature(s) Signed: 02/08/2023 4:51:59 PM By: Baltazar Najjar MD Entered By: Baltazar Najjar on 02/08/2023 13:56:14 -------------------------------------------------------------------------------- HPI Details Patient Name: Date of Service: [name] Bushy [name]. 02/08/2023 12:45 PM Medical Record Number: [id number] Patient Account Number: [id number] Date of Birth/Sex: Treating RN: 1961/01/11 (61 y.o. F) Primary Care Provider: [name] Other Clinician: Referring Provider: Treating Provider/Extender: Renaldo Reel in Treatment: 48 History of Present Illness HPI Description: Admission 03/05/2022 [name] is a 62 year old female with a past medical history of uncontrolled insulin-dependent type 2 diabetes, tobacco user and chronic diastolic heart failure that presents to the clinic for a 74-month history of wound to her left heel. She states she had a left ankle fusion in September 2022. She states that she always had a wound after the surgery and it never healed. She has home health and they have been doing compression wraps along with silver alginate  to the wound bed. She currently denies signs of infection. 6/8; this is a 62 year old woman with type 2 diabetes. She developed a wound on her left Achilles heel just above the tip of the heel in the setting of recurrent ankle surgeries in late 2022. I have seen some of these results from either the cast or the surgical boots that are put on after these operations. She thinks this may be the case. And a sense of pressure ulcer. In any case that she has been using Santyl Hydrofera Blue under compression. She is not wearing any footwear at home as she cannot find anything to accommodate the wrap 6/22; patient presents for follow-up. She has home health that comes out once a week to help with dressing changes. She has no issues or complaints today. We have been using Hydrofera Blue under compression therapy. 7/6; patient presents for follow-up. She states that home health did not come out for the past 2 weeks. It is unclear why. We have been using Hydrofera Blue and Santyl under compression therapy. 7/13; patient presents for follow-up. She did not take the oral antibiotics prescribed at last clinic visit. We have been using Hydrofera Blue with gentamicin/mupirocin ointment under compression therapy. She has no issues or complaints today. She denies signs of infection. 7/20; patient presents for follow-up. We have been using  Hydrofera Blue with antibiotic ointment under 3 layer compression. She has home health that change the dressing once. They put collagen on the wound bed. She also reports falling yesterday and hitting her right foot. She has no pain to the area today. 7/27; patient presents for follow-up. We have been using Hydrofera Blue under 3 layer compression. She has home health that changes the dressing. She has no issues or complaints today. 8/3; patient presents for follow-up. We continues to use Hydrofera Blue under 3 layer compression. She has no issues or complaints today. She has been approved for Epicord at 100%. 8/11; patient presents for follow-up. We have been using Hydrofera Blue under 3 layer compression. She has been approved for Epicord and we have this today. She is in agreement with having this applied. 8/18; patient presents for follow-up. Epicord #1 was placed in standard fashion last clinic visit. She has no issues or complaints today. 9/18; Unfortunately patient has missed her last clinic appointments due to falling and breaking her right ankle. We have been following her for her left ankle wound. She has also developed a large blister to the left heel over the past week that has ruptured and dried. She currently resides In [facility]. Wound9/25; patient presents for follow-up. We have been using Hydrofera Blue and Santyl to the original left ankle wound and Xeroform to the dried blistered. We have been wrapping her with compression therapy. She resides in a facility and they changed the wrap once this past week. She has no issues or complaints today. She denies signs of infection. Leicht, Sharanya [name] ([id number]) [id number].pdf Page 3 of 9 10/3; patient presents for follow-up. We have been using Xeroform to the heel wound and Epicort to the left ankle wound All under compression therapy. She has no issues or complaints today. 10/10; patient presents for follow-up.  We have been using Epicort to the left ankle wound and Santyl and Hydrofera Blue to the heel wound. All under compression therapy. she has no issues or complaints today. 10/16; patient presents for follow-up. Epicort was placed in standard fashion to the superior wound and onto the heel and  Santyl and Hydrofera Blue. She states that the wrap got wet during her shower and her facility was able to rewrap with Kerlix/Coban. 10/23; patient presents for follow-up. Epicord was placed to the wound beds last clinic visit. We continue Kerlix/Coban. She has no issues or complaints today. 10/31; Patient presents for follow-up. Epicord was placed to the wound beds last clinic visit. The original wound is almost healed. We have done this under Kerlix/Coban. She denies signs of infection. 11/10; patient presents for follow-up. Patient's last epi cord was placed in standard fashion at last clinic visit. The original wound has healed. She still has a heel wound. Grafix was approved however too costly for the patient. We will rerun epi cord to see if she can have more applications. She had done very well with this. We have been using Hydrofera Blue to the heel under Kerlix/Coban. 11/21; patient presents for follow-up. We have been using Hydrofera Blue and Santyl to the heel under Kerlix/Coban. She switched to a new healthcare insurance and again The skin substitute is too costly for the patient. She denies signs of infection. 11/28; patient presents for follow-up. Her facility did not change the wrap last clinic visit due to lack of staffing. The wrap was taken off and Hydrofera Blue dressing has been in place for the past week. She has no issues or complaints today. 12/5; patient presents for follow-up. She states she has had more drainage over the past week. They did not change the wrap at her facility. She states the nurse will be back this week. She is also been doing a lot of physical therapy. She has been using  the Prevalon boot while in bed. 12/12; patient presents for follow-up. We have been using Hydrofera Blue with antibiotic ointment under 4-layer compression. She is tolerated the wrap well. She states she is using her Prevalon boot while in the wheelchair. She has no issues or complaints today. There is been improvement in wound healing. 12/19; patient presents for follow-up. We have been using Hydrofera Blue and antibiotic ointment to the wound bed under 4-layer compression. Her facility change the wrap once. She states she has been using the Prevalon boot in bed but not in the wheelchair due to issues with transferring. Overall there is still improvement in wound healing. 1/16; patient presents for follow-up. She missed her last clinic appointment. We have been using Hydrofera Blue and antibiotic ointment under 4-layer compression. At the facility this has only been changed twice over the past 3 weeks. They are not using 4-layer compression. She came in with Kerlix/Coban. 1/23; patient presents for follow-up. We have been using Santyl and Hydrofera Blue under 4-layer compression. Patient has no issues or complaints today. 2/1; patient presents for follow-up. Patient's been using Santyl and Hydrofera Blue to the wound bed. She has developed more slough and now a mild odor. She states she is wearing the Prevalon boot at night. It is unclear if she is offloading this area during the day. 2/13; patient presents for follow-up. Patient's been using Dakin's wet-to-dry dressings. She received her Keystone antibiotic ointment in the mail. She has not used this yet. She reports using her Prevalon boot. She has no issues or complaints today. 2/20; patient presents for follow-up. She has been using Keystone antibiotic ointment with Hydrofera Blue. She has no issues or complaints today. Due to transportation she cannot do the total contact cast today. She is scheduled for next week to have this placed. 2/27;  patient presents for  follow-up. Plan is for the total contact cast today. We have been using Keystone and Hydrofera Blue to the wound bed. She forgot her Keystone antibiotic ointment. She has no issues or complaints today. 2/29; patient presents for follow-up. Patient presents for her obligatory cast change. She has no issues or complaints today. 3/5; patient presents for follow-up. She has had the cast in place for the past week and has tolerated this well. She has no issues or complaints today. We have been using PolyMem with Keystone antibiotic ointment. 12/15/2022: The wound is quite clean and measured smaller today. 3/19; we have been using Keystone antibiotic ointment with PolyMem silver under the total contact cast. Overall there is been improvement in wound healing. 3/26; patient presents for follow-up. She is been approved for epi fix. She would like to proceed with this today. Previously we have been using Keystone antibiotic ointment with PolyMem silver under the total contact cast. She has no issues or complaints. 01/05/2023: The wound is clean and she is tolerating total contact casting without difficulty. She had her first application of EpiFix last week. 4/9; patient presents for follow-up. We have been applying EpiFix under the total contact cast for the past 2 weeks. She is tolerated this well. She has no issues or complaints today. 4/16; patient presents for follow-up. We have been using EpiFix under the total contact cast. She has done well with this. Wound is smaller. She does have slough buildup. 4/23; patient presents for follow-up. We have been using EpiFix under the total contact cast. Wound appears well-healing. She does have slough buildup. 4/30; patient presents for follow-up. We have been using EpiFix under the total contact cast. Wound is smaller. She again has slough buildup. 5/6; patient with a left heel ulcer type II diabetic. We applied EpiFix #7 under a total contact  cast. Her wound is making good progress. Electronic Signature(s) Signed: 02/08/2023 4:51:59 PM By: Baltazar Najjar MD Entered By: Baltazar Najjar on 02/08/2023 13:56:49 -------------------------------------------------------------------------------- Physical Exam Details Patient Name: Date of Service: [name] Bushy [name]. 02/08/2023 12:45 PM Medical Record Number: [id number] Patient Account Number: [id number] Reichelt, Lonnette [name] ([id number]) [phone].pdf Page 4 of 9 Date of Birth/Sex: Treating RN: 02-01-1961 (61 y.o. F) Primary Care Provider: Other Clinician: [name] Referring Provider: Treating Provider/Extender: Renaldo Reel in Treatment: 48 Constitutional Sitting or standing Blood Pressure is within target range for patient.. Pulse regular and within target range for patient.Marland Kitchen Respirations regular, non-labored and within target range.. Temperature is normal and within the target range for the patient.Marland Kitchen Appears in no distress. Notes Wound exam; the wound is on the tip of her left heel. Under illumination a lot of surface slough. I used a #3 curette to debride this requiring silver nitrate for hemostasis after dipping into the subcutaneous tissue. We then applied EpiFix #7 in the standard fashion. Finally applying a total contact cast #4 Electronic Signature(s) Signed: 02/08/2023 4:51:59 PM By: Baltazar Najjar MD Entered By: Baltazar Najjar on 02/08/2023 13:57:52 -------------------------------------------------------------------------------- Physician Orders Details Patient Name: Date of Service: [name] Bushy [name]. 02/08/2023 12:45 PM Medical Record Number: [id number] Patient Account Number: [id number] Date of Birth/Sex: Treating RN: January 30, 1961 (61 y.o. [name] Primary Care Provider: [name] Other Clinician: Referring Provider: Treating Provider/Extender: Renaldo Reel in Treatment:  [id number] Verbal / Phone Orders: No Diagnosis Coding ICD-10 Coding Code Description [phone] Non-pressure chronic ulcer of other part of left foot with fat layer exposed E11.621 Type 2 diabetes  mellitus with foot ulcer E66.01 Morbid (severe) obesity due to excess calories Follow-up Appointments ppointment in 1 week. - Dr. Mikey Bussing 02/16/2023 215pm Return A Other: - Leave dressing in place. Facility not to change dressing. Anesthetic (In clinic) Topical Lidocaine 5% applied to wound bed (In clinic) Topical Lidocaine 4% applied to wound bed Cellular or Tissue Based Products Cellular or Tissue Based Product Type: - Run IVR for EpiFix and Epicord=100% covered 05/07/22: Epicord ordered 12/08/2022: Run insurance for epicord and theraskin. 01/19/2023: Run for organogenesis- puraply AM daptic or Mepitel. (DO NOT REMOVE). - Cellular or Tissue Based Product applied to wound bed, secured with steri-strips, cover with A Epicord #1 05/15/25 Epicord #2 05/22/22 Epicord #3 06/29/2022 Epicord #4 07/07/2022 Epifix #5 07/14/22 Epicord #6 07/20/22 Epicord # 7 07/27/22 ***Run for Grafix***PENDING Epicord #8 08/04/2022 2024-IVR epifix approved 12/29/2022 #1 epifix applied to left heel. 01/05/2023 #2 epifix applied to left heel. 01/12/2023 #3 epifix applied to left heel 01/19/2023 #4 epifix applied to left heel. 01/26/23 Epifix #5 02/08/2023 #7 epifix Bathing/ Shower/ Hygiene May shower with protection but do not get wound dressing(s) wet. Protect dressing(s) with water repellant cover (for example, large plastic bag) or a cast cover and may then take shower. Edema Control - Lymphedema / SCD / Other Elevate legs to the level of the heart or above for 30 minutes daily and/or when sitting for 3-4 times a day throughout the day. Avoid standing for long periods of time. Cenci, Pihu [name] ([id number]) [id number].pdf Page 5 of 9 Off-Loading Total Contact Cast to Left Lower Extremity - size 4 pad the  posterior right lower leg. Additional Orders / Instructions Follow Nutritious Diet - Monitor/Control Blood Sugar Wound Treatment Wound #2 - Calcaneus Wound Laterality: Left, Distal Cleanser: Soap and Water 1 x Per Week/30 Days Discharge Instructions: May shower and wash wound with dial antibacterial soap and water prior to dressing change. Cleanser: Wound Cleanser 1 x Per Week/30 Days Discharge Instructions: Cleanse the wound with wound cleanser prior to applying a clean dressing using gauze sponges, not tissue or cotton balls. Prim Dressing: epifix 1 x Per Week/30 Days ary Discharge Instructions: applied by provider Prim Dressing: adaptic and steri-strips 1 x Per Week/30 Days ary Discharge Instructions: secured the epifix Secondary Dressing: ABD Pad, 8x10 1 x Per Week/30 Days Discharge Instructions: Apply over primary dressing as directed. Secondary Dressing: Woven Gauze Sponge, Non-Sterile 4x4 in (Home Health) 1 x Per Week/30 Days Discharge Instructions: Apply over primary dressing as directed. Secured With: [employer], 4.5x3.1 (in/yd) 1 x Per Week/30 Days Discharge Instructions: Secure with Kerlix as directed. Electronic Signature(s) Signed: 02/08/2023 4:51:59 PM By: Baltazar Najjar MD Signed: 02/08/2023 5:08:59 PM By: Shawn Stall RN, BSN Entered By: Shawn Stall on 02/08/2023 13:38:54 -------------------------------------------------------------------------------- Problem List Details Patient Name: Date of Service: [name] Bushy [name]. 02/08/2023 12:45 PM Medical Record Number: [id number] Patient Account Number: [id number] Date of Birth/Sex: Treating RN: 19-Mar-1961 (61 y.o. [name], [name] Primary Care Provider: [name] Other Clinician: Referring Provider: Treating Provider/Extender: Renaldo Reel in Treatment: 48 Active Problems ICD-10 Encounter Code Description Active Date MDM Diagnosis [phone] Non-pressure chronic ulcer of other  part of left foot with fat layer exposed 03/05/2022 No Yes E11.621 Type 2 diabetes mellitus with foot ulcer 03/05/2022 No Yes E66.01 Morbid (severe) obesity due to excess calories 03/05/2022 No Yes Inactive Problems Resolved Problems Electronic Signature(s) Signed: 02/08/2023 4:51:59 PM By: Baltazar Najjar MD Viglione, Raelynne [name] ([id number]) [id number] Page 6 of 9 Entered  By: Baltazar Najjar on 02/08/2023 13:55:15 -------------------------------------------------------------------------------- Progress Note Details Patient Name: Date of Service: [name] 02/08/2023 12:45 PM Medical Record Number: [id number] Patient Account Number: [id number] Date of Birth/Sex: Treating RN: [birth year] (62 y.o. F) Primary Care Provider: [name] Other Clinician: Referring Provider: Treating Provider/Extender: Renaldo Reel in Treatment: 48 Subjective History of Present Illness (HPI) Admission 03/05/2022 Ms. Lacy Mcclammy is a 62 year old female with a past medical history of uncontrolled insulin-dependent type 2 diabetes, tobacco user and chronic diastolic heart failure that presents to the clinic for a 64-month history of wound to her left heel. She states she had a left ankle fusion in September 2022. She states that she always had a wound after the surgery and it never healed. She has home health and they have been doing compression wraps along with silver alginate to the wound bed. She currently denies signs of infection. 6/8; this is a 62 year old woman with type 2 diabetes. She developed a wound on her left Achilles heel just above the tip of the heel in the setting of recurrent ankle surgeries in late 2022. I have seen some of these results from either the cast or the surgical boots that are put on after these operations. She thinks this may be the case. And a sense of pressure ulcer. In any case that she has been using Santyl Hydrofera Blue under  compression. She is not wearing any footwear at home as she cannot find anything to accommodate the wrap 6/22; patient presents for follow-up. She has home health that comes out once a week to help with dressing changes. She has no issues or complaints today. We have been using Hydrofera Blue under compression therapy. 7/6; patient presents for follow-up. She states that home health did not come out for the past 2 weeks. It is unclear why. We have been using Hydrofera Blue and Santyl under compression therapy. 7/13; patient presents for follow-up. She did not take the oral antibiotics prescribed at last clinic visit. We have been using Hydrofera Blue with gentamicin/mupirocin ointment under compression therapy. She has no issues or complaints today. She denies signs of infection. 7/20; patient presents for follow-up. We have been using Hydrofera Blue with antibiotic ointment under 3 layer compression. She has home health that change the dressing once. They put collagen on the wound bed. She also reports falling yesterday and hitting her right foot. She has no pain to the area today. 7/27; patient presents for follow-up. We have been using Hydrofera Blue under 3 layer compression. She has home health that changes the dressing. She has no issues or complaints today. 8/3; patient presents for follow-up. We continues to use Hydrofera Blue under 3 layer compression. She has no issues or complaints today. She has been approved for Epicord at 100%. 8/11; patient presents for follow-up. We have been using Hydrofera Blue under 3 layer compression. She has been approved for Epicord and we have this today. She is in agreement with having this applied. 8/18; patient presents for follow-up. Epicord #1 was placed in standard fashion last clinic visit. She has no issues or complaints today. 9/18; Unfortunately patient has missed her last clinic appointments due to falling and breaking her right ankle. We have been  following her for her left ankle wound. She has also developed a large blister to the left heel over the past week that has ruptured and dried. She currently resides In [facility]. Wound9/25; patient presents for follow-up. We have  been using Hydrofera Blue and Santyl to the original left ankle wound and Xeroform to the dried blistered. We have been wrapping her with compression therapy. She resides in a facility and they changed the wrap once this past week. She has no issues or complaints today. She denies signs of infection. 10/3; patient presents for follow-up. We have been using Xeroform to the heel wound and Epicort to the left ankle wound All under compression therapy. She has no issues or complaints today. 10/10; patient presents for follow-up. We have been using Epicort to the left ankle wound and Santyl and Hydrofera Blue to the heel wound. All under compression therapy. she has no issues or complaints today. 10/16; patient presents for follow-up. Epicort was placed in standard fashion to the superior wound and onto the heel and Santyl and Hydrofera Blue. She states that the wrap got wet during her shower and her facility was able to rewrap with Kerlix/Coban. 10/23; patient presents for follow-up. Epicord was placed to the wound beds last clinic visit. We continue Kerlix/Coban. She has no issues or complaints today. 10/31; Patient presents for follow-up. Epicord was placed to the wound beds last clinic visit. The original wound is almost healed. We have done this under Kerlix/Coban. She denies signs of infection. 11/10; patient presents for follow-up. Patient's last epi cord was placed in standard fashion at last clinic visit. The original wound has healed. She still has a heel wound. Grafix was approved however too costly for the patient. We will rerun epi cord to see if she can have more applications. She had done very well with this. We have been using Hydrofera Blue to the heel  under Kerlix/Coban. 11/21; patient presents for follow-up. We have been using Hydrofera Blue and Santyl to the heel under Kerlix/Coban. She switched to a new healthcare insurance and again The skin substitute is too costly for the patient. She denies signs of infection. 11/28; patient presents for follow-up. Her facility did not change the wrap last clinic visit due to lack of staffing. The wrap was taken off and Hydrofera Blue dressing has been in place for the past week. She has no issues or complaints today. 12/5; patient presents for follow-up. She states she has had more drainage over the past week. They did not change the wrap at her facility. She states the nurse will be back this week. She is also been doing a lot of physical therapy. She has been using the Prevalon boot while in bed. 12/12; patient presents for follow-up. We have been using Hydrofera Blue with antibiotic ointment under 4-layer compression. She is tolerated the wrap well. She states she is using her Prevalon boot while in the wheelchair. She has no issues or complaints today. There is been improvement in wound healing. Alviar, Madina [name] ([id number]) [id number].pdf Page 7 of 9 12/19; patient presents for follow-up. We have been using Hydrofera Blue and antibiotic ointment to the wound bed under 4-layer compression. Her facility change the wrap once. She states she has been using the Prevalon boot in bed but not in the wheelchair due to issues with transferring. Overall there is still improvement in wound healing. 1/16; patient presents for follow-up. She missed her last clinic appointment. We have been using Hydrofera Blue and antibiotic ointment under 4-layer compression. At the facility this has only been changed twice over the past 3 weeks. They are not using 4-layer compression. She came in with Kerlix/Coban. 1/23; patient presents for follow-up. We have been using Santyl  and Hydrofera Blue under  4-layer compression. Patient has no issues or complaints today. 2/1; patient presents for follow-up. Patient's been using Santyl and Hydrofera Blue to the wound bed. She has developed more slough and now a mild odor. She states she is wearing the Prevalon boot at night. It is unclear if she is offloading this area during the day. 2/13; patient presents for follow-up. Patient's been using [employer]'s wet-to-dry dressings. She received her Keystone antibiotic ointment in the mail. She has not used this yet. She reports using her Prevalon boot. She has no issues or complaints today. 2/20; patient presents for follow-up. She has been using Keystone antibiotic ointment with Hydrofera Blue. She has no issues or complaints today. Due to transportation she cannot do the total contact cast today. She is scheduled for next week to have this placed. 2/27; patient presents for follow-up. Plan is for the total contact cast today. We have been using Keystone and Hydrofera Blue to the wound bed. She forgot her Keystone antibiotic ointment. She has no issues or complaints today. 2/29; patient presents for follow-up. Patient presents for her obligatory cast change. She has no issues or complaints today. 3/5; patient presents for follow-up. She has had the cast in place for the past week and has tolerated this well. She has no issues or complaints today. We have been using PolyMem with Keystone antibiotic ointment. 12/15/2022: The wound is quite clean and measured smaller today. 3/19; we have been using Keystone antibiotic ointment with PolyMem silver under the total contact cast. Overall there is been improvement in wound healing. 3/26; patient presents for follow-up. She is been approved for epi fix. She would like to proceed with this today. Previously we have been using Keystone antibiotic ointment with PolyMem silver under the total contact cast. She has no issues or complaints. 01/05/2023: The wound is clean and she is  tolerating total contact casting without difficulty. She had her first application of EpiFix last week. 4/9; patient presents for follow-up. We have been applying EpiFix under the total contact cast for the past 2 weeks. She is tolerated this well. She has no issues or complaints today. 4/16; patient presents for follow-up. We have been using EpiFix under the total contact cast. She has done well with this. Wound is smaller. She does have slough buildup. 4/23; patient presents for follow-up. We have been using EpiFix under the total contact cast. Wound appears well-healing. She does have slough buildup. 4/30; patient presents for follow-up. We have been using EpiFix under the total contact cast. Wound is smaller. She again has slough buildup. 5/6; patient with a left heel ulcer type II diabetic. We applied EpiFix #7 under a total contact cast. Her wound is making good progress. Objective Constitutional Sitting or standing Blood Pressure is within target range for patient.. Pulse regular and within target range for patient.Marland Kitchen Respirations regular, non-labored and within target range.. Temperature is normal and within the target range for the patient.Marland Kitchen Appears in no distress. Vitals Time Taken: 12:54 PM, Height: 66 in, Weight: 339 lbs, BMI: 54.7, Temperature: 98.4 F, Pulse: 92 bpm, Respiratory Rate: 18 breaths/min, Blood Pressure: 142/72 mmHg. General Notes: Wound exam; the wound is on the tip of her left heel. Under illumination a lot of surface slough. I used a #3 curette to debride this requiring silver nitrate for hemostasis after dipping into the subcutaneous tissue. We then applied EpiFix #7 in the standard fashion. Finally applying a total contact cast #4 Integumentary (Hair, Skin) Wound #  2 status is Open. Original cause of wound was Blister. The date acquired was: 06/15/2022. The wound has been in treatment 32 weeks. The wound is located on the Left,Distal Calcaneus. The wound measures 1.8cm  length x 2cm width x 0.1cm depth; 2.827cm^2 area and 0.283cm^3 volume. There is Fat Layer (Subcutaneous Tissue) exposed. There is no tunneling or undermining noted. There is a medium amount of serosanguineous drainage noted. The wound margin is distinct with the outline attached to the wound base. There is large (67-100%) red, pink, hyper - granulation within the wound bed. There is a small (1- 33%) amount of necrotic tissue within the wound bed including Adherent Slough. The periwound skin appearance had no abnormalities noted for texture. The periwound skin appearance had no abnormalities noted for color. The periwound skin appearance did not exhibit: Dry/Scaly, Maceration. Periwound temperature was noted as No Abnormality. Assessment Active Problems ICD-10 Non-pressure chronic ulcer of other part of left foot with fat layer exposed Type 2 diabetes mellitus with foot ulcer Morbid (severe) obesity due to excess calories Cadmus, Arlita [name] ([id number]) [phone].pdf Page 8 of 9 Procedures Wound #2 Pre-procedure diagnosis of Wound #2 is a Diabetic Wound/Ulcer of the Lower Extremity located on the Left,Distal Calcaneus .Severity of Tissue Pre Debridement is: Fat layer exposed. There was a Excisional Skin/Subcutaneous Tissue Debridement with a total area of 2.83 sq cm performed by Maxwell Caul., MD. With the following instrument(s): Curette to remove Viable and Non-Viable tissue/material. Material removed includes Subcutaneous Tissue, Slough, Skin: Dermis, Skin: Epidermis, and Biofilm after achieving pain control using Lidocaine 4% Topical Solution. A time out was conducted at 13:25, prior to the start of the procedure. A Minimum amount of bleeding was controlled with Pressure. The procedure was tolerated well with a pain level of 0 throughout and a pain level of 0 following the procedure. Post Debridement Measurements: 1.8cm length x 2cm width x 0.1cm depth; 0.283cm^3  volume. Character of Wound/Ulcer Post Debridement is improved. Severity of Tissue Post Debridement is: Fat layer exposed. Post procedure Diagnosis Wound #2: Same as Pre-Procedure Pre-procedure diagnosis of Wound #2 is a Diabetic Wound/Ulcer of the Lower Extremity located on the Left,Distal Calcaneus. A skin graft procedure using a bioengineered skin substitute/cellular or tissue based product was performed by Maxwell Caul., MD with the following instrument(s): Forceps and Scissors. Epifix was applied and secured with Steri-Strips and adaptic. 6 sq cm of product was utilized and 0 sq cm was wasted. Post Application, no dressing was applied. A Time Out was conducted at 13:31, prior to the start of the procedure. The procedure was tolerated well with a pain level of 0 throughout and a pain level of 0 following the procedure. Post procedure Diagnosis Wound #2: Same as Pre-Procedure . Pre-procedure diagnosis of Wound #2 is a Diabetic Wound/Ulcer of the Lower Extremity located on the Left,Distal Calcaneus . There was a T Contact Cast otal Procedure by Maxwell Caul., MD. Post procedure Diagnosis Wound #2: Same as Pre-Procedure Notes: SIZE 4. Plan Follow-up Appointments: Return Appointment in 1 week. - Dr. Mikey Bussing 02/16/2023 215pm Other: - Leave dressing in place. Facility not to change dressing. Anesthetic: (In clinic) Topical Lidocaine 5% applied to wound bed (In clinic) Topical Lidocaine 4% applied to wound bed Cellular or Tissue Based Products: Cellular or Tissue Based Product Type: - Run IVR for EpiFix and Epicord=100% covered 05/07/22: Epicord ordered 12/08/2022: Run insurance for epicord and theraskin. 01/19/2023: Run for organogenesis- puraply AM Cellular or Tissue Based Product applied to  wound bed, secured with steri-strips, cover with Adaptic or Mepitel. (DO NOT REMOVE). - Epicord #1 05/15/25 Epicord #2 05/22/22 Epicord #3 06/29/2022 Epicord #4 07/07/2022 Epifix #5 07/14/22 Epicord #6  07/20/22 Epicord # 7 07/27/22 ***Run for Grafix***PENDING Epicord #8 08/04/2022 2024-IVR epifix approved 12/29/2022 #1 epifix applied to left heel. 01/05/2023 #2 epifix applied to left heel. 01/12/2023 #3 epifix applied to left heel 01/19/2023 #4 epifix applied to left heel. 01/26/23 Epifix #5 02/08/2023 #7 epifix Bathing/ Shower/ Hygiene: May shower with protection but do not get wound dressing(s) wet. Protect dressing(s) with water repellant cover (for example, large plastic bag) or a cast cover and may then take shower. Edema Control - Lymphedema / SCD / Other: Elevate legs to the level of the heart or above for 30 minutes daily and/or when sitting for 3-4 times a day throughout the day. Avoid standing for long periods of time. Off-Loading: T Contact Cast to Left Lower Extremity - size 4 pad the posterior right lower leg. otal Additional Orders / Instructions: Follow Nutritious Diet - Monitor/Control Blood Sugar WOUND #2: - Calcaneus Wound Laterality: Left, Distal Cleanser: Soap and Water 1 x Per Week/30 Days Discharge Instructions: May shower and wash wound with dial antibacterial soap and water prior to dressing change. Cleanser: Wound Cleanser 1 x Per Week/30 Days Discharge Instructions: Cleanse the wound with wound cleanser prior to applying a clean dressing using gauze sponges, not tissue or cotton balls. Prim Dressing: epifix 1 x Per Week/30 Days ary Discharge Instructions: applied by provider Prim Dressing: adaptic and steri-strips 1 x Per Week/30 Days ary Discharge Instructions: secured the epifix Secondary Dressing: ABD Pad, 8x10 1 x Per Week/30 Days Discharge Instructions: Apply over primary dressing as directed. Secondary Dressing: Woven Gauze Sponge, Non-Sterile 4x4 in (Home Health) 1 x Per Week/30 Days Discharge Instructions: Apply over primary dressing as directed. Secured With: [employer], 4.5x3.1 (in/yd) 1 x Per Week/30 Days Discharge Instructions: Secure with Kerlix  as directed. 1. The wound is making good progress. 2. After debridement, EpiFix #7 and total contact cast Electronic Signature(s) Signed: 02/08/2023 4:51:59 PM By: Baltazar Najjar MD Entered By: Baltazar Najjar on 02/08/2023 13:58:25 Lanigan, Shuntia [name] ([id number]) [id number] Page 9 of 9 -------------------------------------------------------------------------------- Total Contact Cast Details Patient Name: Date of Service: [name] Bushy [name]. 02/08/2023 12:45 PM Medical Record Number: [id number] Patient Account Number: [id number] Date of Birth/Sex: Treating RN: August 14, 1961 (62 y.o. F) Primary Care Provider: [name] Other Clinician: Referring Provider: Treating Provider/Extender: Renaldo Reel in Treatment: 48 T Contact Cast Applied for Wound Assessment: otal Wound #2 Left,Distal Calcaneus Performed By: Physician Maxwell Caul., MD Post Procedure Diagnosis Same as Pre-procedure Notes SIZE 4 Electronic Signature(s) Signed: 02/08/2023 4:51:59 PM By: Baltazar Najjar MD Entered By: Baltazar Najjar on 02/08/2023 13:55:53 -------------------------------------------------------------------------------- SuperBill Details Patient Name: Date of Service: [name] Bushy [name]. 02/08/2023 Medical Record Number: [id number] Patient Account Number: [id number] Date of Birth/Sex: Treating RN: 27-Nov-1960 (61 y.o. [name] Primary Care Provider: [name] Other Clinician: Referring Provider: Treating Provider/Extender: Renaldo Reel in Treatment: 48 Diagnosis Coding ICD-10 Codes Code Description [phone] Non-pressure chronic ulcer of other part of left foot with fat layer exposed E11.621 Type 2 diabetes mellitus with foot ulcer E66.01 Morbid (severe) obesity due to excess calories Facility Procedures : CPT4 Code: [id number] Description: Q4186 Epifix 2cm x 3cm Modifier: Quantity: 6 : CPT4 Code:  [id number] Description: 15275 - SKIN SUB GRAFT FACE/NK/HF/G ICD-10 Diagnosis Description L97.522 Non-pressure chronic  ulcer of other part of left foot with fat layer exposed E11.621 Type 2 diabetes mellitus with foot ulcer Modifier: Quantity: 1 Physician Procedures : CPT4 Code Description Modifier [id number] 15275 - WC PHYS SKIN SUB GRAFT FACE/NK/HF/G ICD-10 Diagnosis Description L97.522 Non-pressure chronic ulcer of other part of left foot with fat layer exposed E11.621 Type 2 diabetes mellitus with foot ulcer Quantity: 1 Electronic Signature(s) Signed: 02/08/2023 4:51:59 PM By: Baltazar Najjar MD Entered By: Baltazar Najjar on 02/08/2023 13:58:40

## 2023-02-16 ENCOUNTER — Encounter (HOSPITAL_BASED_OUTPATIENT_CLINIC_OR_DEPARTMENT_OTHER): Payer: 59 | Admitting: Internal Medicine

## 2023-02-16 DIAGNOSIS — L97522 Non-pressure chronic ulcer of other part of left foot with fat layer exposed: Secondary | ICD-10-CM

## 2023-02-16 DIAGNOSIS — E11621 Type 2 diabetes mellitus with foot ulcer: Secondary | ICD-10-CM

## 2023-02-23 ENCOUNTER — Encounter (HOSPITAL_BASED_OUTPATIENT_CLINIC_OR_DEPARTMENT_OTHER): Payer: 59 | Admitting: Internal Medicine

## 2023-02-23 DIAGNOSIS — L97522 Non-pressure chronic ulcer of other part of left foot with fat layer exposed: Secondary | ICD-10-CM | POA: Diagnosis not present

## 2023-02-23 DIAGNOSIS — E11621 Type 2 diabetes mellitus with foot ulcer: Secondary | ICD-10-CM

## 2023-03-02 ENCOUNTER — Encounter (HOSPITAL_BASED_OUTPATIENT_CLINIC_OR_DEPARTMENT_OTHER): Payer: 59 | Admitting: Internal Medicine

## 2023-03-02 DIAGNOSIS — L97522 Non-pressure chronic ulcer of other part of left foot with fat layer exposed: Secondary | ICD-10-CM | POA: Diagnosis not present

## 2023-03-02 DIAGNOSIS — E11621 Type 2 diabetes mellitus with foot ulcer: Secondary | ICD-10-CM

## 2023-03-09 ENCOUNTER — Encounter (HOSPITAL_BASED_OUTPATIENT_CLINIC_OR_DEPARTMENT_OTHER): Payer: 59 | Attending: Internal Medicine | Admitting: Internal Medicine

## 2023-03-09 DIAGNOSIS — J449 Chronic obstructive pulmonary disease, unspecified: Secondary | ICD-10-CM | POA: Diagnosis not present

## 2023-03-09 DIAGNOSIS — I252 Old myocardial infarction: Secondary | ICD-10-CM | POA: Insufficient documentation

## 2023-03-09 DIAGNOSIS — I110 Hypertensive heart disease with heart failure: Secondary | ICD-10-CM | POA: Diagnosis not present

## 2023-03-09 DIAGNOSIS — I5032 Chronic diastolic (congestive) heart failure: Secondary | ICD-10-CM | POA: Diagnosis not present

## 2023-03-09 DIAGNOSIS — I509 Heart failure, unspecified: Secondary | ICD-10-CM | POA: Diagnosis not present

## 2023-03-09 DIAGNOSIS — E11621 Type 2 diabetes mellitus with foot ulcer: Secondary | ICD-10-CM | POA: Insufficient documentation

## 2023-03-09 DIAGNOSIS — L97522 Non-pressure chronic ulcer of other part of left foot with fat layer exposed: Secondary | ICD-10-CM | POA: Insufficient documentation

## 2023-03-09 DIAGNOSIS — E1140 Type 2 diabetes mellitus with diabetic neuropathy, unspecified: Secondary | ICD-10-CM | POA: Diagnosis not present

## 2023-03-09 DIAGNOSIS — Z8711 Personal history of peptic ulcer disease: Secondary | ICD-10-CM | POA: Insufficient documentation

## 2023-03-09 DIAGNOSIS — K219 Gastro-esophageal reflux disease without esophagitis: Secondary | ICD-10-CM | POA: Insufficient documentation

## 2023-03-09 DIAGNOSIS — Z6841 Body Mass Index (BMI) 40.0 and over, adult: Secondary | ICD-10-CM | POA: Insufficient documentation

## 2023-03-16 ENCOUNTER — Encounter (HOSPITAL_BASED_OUTPATIENT_CLINIC_OR_DEPARTMENT_OTHER): Payer: 59 | Admitting: Internal Medicine

## 2023-03-16 DIAGNOSIS — E11621 Type 2 diabetes mellitus with foot ulcer: Secondary | ICD-10-CM

## 2023-03-16 DIAGNOSIS — L97522 Non-pressure chronic ulcer of other part of left foot with fat layer exposed: Secondary | ICD-10-CM | POA: Diagnosis not present

## 2023-03-16 NOTE — Progress Notes (Signed)
Totten, Envi [name] ([id number]) [id number] Page 1 of 11 Visit Report for 02/02/2023 Chief Complaint Document Details Patient Name: Date of Service: [name] 02/02/2023 8:15 A M Medical Record Number: [id number] Patient Account Number: [id number] Date of Birth/Sex: Treating RN: 1961/04/01 (62 y.o. F) Primary Care Provider: [name] Other Clinician: Referring Provider: Treating Provider/Extender: [name] in Treatment: 78 Information Obtained from: Patient Chief Complaint 03/05/2022; left foot wound Electronic Signature(s) Signed: 02/02/2023 11:15:37 AM By: Geralyn Corwin DO Entered By: Geralyn Corwin on 02/02/2023 09:17:19 -------------------------------------------------------------------------------- Cellular or Tissue Based Product Details Patient Name: Date of Service: [name] Bushy [name]. 02/02/2023 8:15 A M Medical Record Number: [id number] Patient Account Number: [id number] Date of Birth/Sex: Treating RN: 19-Oct-1960 (62 y.o. [name] Primary Care Provider: [name] Other Clinician: Referring Provider: Treating Provider/Extender: [name] in Treatment: 47 Cellular or Tissue Based Product Type Wound #2 Left,Distal Calcaneus Applied to: Performed By: Physician Geralyn Corwin, DO Cellular or Tissue Based Product Type: Epifix Level of Consciousness (Pre-procedure): Awake and Alert Pre-procedure Verification/Time Out Yes - 09:30 Taken: Location: trunk / arms / legs Wound Size (sq cm): 4.4 Product Size (sq cm): 6 Waste Size (sq cm): 0 Amount of Product Applied (sq cm): 6 Instrument Used: Forceps, Scissors Lot #: [id number] Order #: [id number] Expiration Date: 09/05/2027 Fenestrated: No Reconstituted: Yes Solution Type: NS Solution Amount: 3ml Lot #: [id number] Solution Expiration Date: 02/01/2025 Secured: Yes Secured With: Steri-Strips Dressing Applied:  Yes Primary Dressing: Foam Border Procedural Pain: 0 Post Procedural Pain: 0 Response to Treatment: Procedure was tolerated well Level of Consciousness (Post- Awake and Alert procedure): Post Procedure Diagnosis Same as Pre-procedure Notes Scribed for Dr Mikey Bussing by Brenton Grills RN. Horsley, Monigue [name] ([id number]) [id number] Page 2 of 11 Electronic Signature(s) Signed: 02/02/2023 12:48:01 PM By: Geralyn Corwin DO Signed: 03/16/2023 7:49:05 AM By: Brenton Grills Entered By: Brenton Grills on 02/02/2023 12:03:14 -------------------------------------------------------------------------------- Debridement Details Patient Name: Date of Service: [name] Bushy [name]. 02/02/2023 8:15 A M Medical Record Number: [id number] Patient Account Number: [id number] Date of Birth/Sex: Treating RN: 1960/12/18 (61 y.o. [name] Primary Care Provider: [name] Other Clinician: Referring Provider: Treating Provider/Extender: [name] in Treatment: 47 Debridement Performed for Assessment: Wound #2 Left,Distal Calcaneus Performed By: Physician Geralyn Corwin, DO Debridement Type: Debridement Severity of Tissue Pre Debridement: Fat layer exposed Level of Consciousness (Pre-procedure): Awake and Alert Pre-procedure Verification/Time Out Yes - 08:50 Taken: Start Time: 08:51 Pain Control: Lidocaine 4% T opical Solution Percent of Wound Bed Debrided: 100% T Area Debrided (cm): otal 3.45 Tissue and other material debrided: Viable, Non-Viable, Slough, Subcutaneous, Slough Level: Skin/Subcutaneous Tissue Debridement Description: Excisional Instrument: Curette Bleeding: Minimum Hemostasis Achieved: Pressure End Time: 08:55 Procedural Pain: 0 Post Procedural Pain: 0 Response to Treatment: Procedure was tolerated well Level of Consciousness (Post- Awake and Alert procedure): Post Debridement Measurements of Total Wound Length:  (cm) 2 Width: (cm) 2.2 Depth: (cm) 0.1 Volume: (cm) 0.346 Character of Wound/Ulcer Post Debridement: Improved Severity of Tissue Post Debridement: Fat layer exposed Post Procedure Diagnosis Same as Pre-procedure Notes Scribed for Dr Mikey Bussing by Brenton Grills RN. Electronic Signature(s) Signed: 02/02/2023 12:48:01 PM By: Geralyn Corwin DO Signed: 03/16/2023 7:49:05 AM By: Brenton Grills Entered By: Brenton Grills on 02/02/2023 11:57:19 -------------------------------------------------------------------------------- HPI Details Patient Name: Date of Service: [name] Bushy [name]. 02/02/2023 8:15 A M Medical Record Number: [id number] Patient Account Number: [id number] Date of Birth/Sex: Treating RN: 1961-07-26 (  62 y.o. F) Primary Care Provider: [name] Other Clinician: Referring Provider: Treating Provider/Extender: [name] in Treatment: 48 History of Present Illness [name] ([id number]) [id number] Page 3 of 11 HPI Description: Admission 03/05/2022 [name] is a 62 year old female with a past medical history of uncontrolled insulin-dependent type 2 diabetes, tobacco user and chronic diastolic heart failure that presents to the clinic for a 17-month history of wound to her left heel. She states she had a left ankle fusion in September 2022. She states that she always had a wound after the surgery and it never healed. She has home health and they have been doing compression wraps along with silver alginate to the wound bed. She currently denies signs of infection. 6/8; this is a 62 year old woman with type 2 diabetes. She developed a wound on her left Achilles heel just above the tip of the heel in the setting of recurrent ankle surgeries in late 2022. I have seen some of these results from either the cast or the surgical boots that are put on after these operations. She thinks this may be the case. And a sense  of pressure ulcer. In any case that she has been using Santyl Hydrofera Blue under compression. She is not wearing any footwear at home as she cannot find anything to accommodate the wrap 6/22; patient presents for follow-up. She has home health that comes out once a week to help with dressing changes. She has no issues or complaints today. We have been using Hydrofera Blue under compression therapy. 7/6; patient presents for follow-up. She states that home health did not come out for the past 2 weeks. It is unclear why. We have been using Hydrofera Blue and Santyl under compression therapy. 7/13; patient presents for follow-up. She did not take the oral antibiotics prescribed at last clinic visit. We have been using Hydrofera Blue with gentamicin/mupirocin ointment under compression therapy. She has no issues or complaints today. She denies signs of infection. 7/20; patient presents for follow-up. We have been using Hydrofera Blue with antibiotic ointment under 3 layer compression. She has home health that change the dressing once. They put collagen on the wound bed. She also reports falling yesterday and hitting her right foot. She has no pain to the area today. 7/27; patient presents for follow-up. We have been using Hydrofera Blue under 3 layer compression. She has home health that changes the dressing. She has no issues or complaints today. 8/3; patient presents for follow-up. We continues to use Hydrofera Blue under 3 layer compression. She has no issues or complaints today. She has been approved for Epicord at 100%. 8/11; patient presents for follow-up. We have been using Hydrofera Blue under 3 layer compression. She has been approved for Epicord and we have this today. She is in agreement with having this applied. 8/18; patient presents for follow-up. Epicord #1 was placed in standard fashion last clinic visit. She has no issues or complaints today. 9/18; Unfortunately patient has missed her  last clinic appointments due to falling and breaking her right ankle. We have been following her for her left ankle wound. She has also developed a large blister to the left heel over the past week that has ruptured and dried. She currently resides In [facility]. Wound9/25; patient presents for follow-up. We have been using Hydrofera Blue and Santyl to the original left ankle wound and Xeroform to the dried blistered. We have been wrapping her with compression therapy. She resides  in a facility and they changed the wrap once this past week. She has no issues or complaints today. She denies signs of infection. 10/3; patient presents for follow-up. We have been using Xeroform to the heel wound and Epicort to the left ankle wound All under compression therapy. She has no issues or complaints today. 10/10; patient presents for follow-up. We have been using Epicort to the left ankle wound and Santyl and Hydrofera Blue to the heel wound. All under compression therapy. she has no issues or complaints today. 10/16; patient presents for follow-up. Epicort was placed in standard fashion to the superior wound and onto the heel and Santyl and Hydrofera Blue. She states that the wrap got wet during her shower and her facility was able to rewrap with Kerlix/Coban. 10/23; patient presents for follow-up. Epicord was placed to the wound beds last clinic visit. We continue Kerlix/Coban. She has no issues or complaints today. 10/31; Patient presents for follow-up. Epicord was placed to the wound beds last clinic visit. The original wound is almost healed. We have done this under Kerlix/Coban. She denies signs of infection. 11/10; patient presents for follow-up. Patient's last epi cord was placed in standard fashion at last clinic visit. The original wound has healed. She still has a heel wound. Grafix was approved however too costly for the patient. We will rerun epi cord to see if she can have more applications.  She had done very well with this. We have been using Hydrofera Blue to the heel under Kerlix/Coban. 11/21; patient presents for follow-up. We have been using Hydrofera Blue and Santyl to the heel under Kerlix/Coban. She switched to a new healthcare insurance and again The skin substitute is too costly for the patient. She denies signs of infection. 11/28; patient presents for follow-up. Her facility did not change the wrap last clinic visit due to lack of staffing. The wrap was taken off and Hydrofera Blue dressing has been in place for the past week. She has no issues or complaints today. 12/5; patient presents for follow-up. She states she has had more drainage over the past week. They did not change the wrap at her facility. She states the nurse will be back this week. She is also been doing a lot of physical therapy. She has been using the Prevalon boot while in bed. 12/12; patient presents for follow-up. We have been using Hydrofera Blue with antibiotic ointment under 4-layer compression. She is tolerated the wrap well. She states she is using her Prevalon boot while in the wheelchair. She has no issues or complaints today. There is been improvement in wound healing. 12/19; patient presents for follow-up. We have been using Hydrofera Blue and antibiotic ointment to the wound bed under 4-layer compression. Her facility change the wrap once. She states she has been using the Prevalon boot in bed but not in the wheelchair due to issues with transferring. Overall there is still improvement in wound healing. 1/16; patient presents for follow-up. She missed her last clinic appointment. We have been using Hydrofera Blue and antibiotic ointment under 4-layer compression. At the facility this has only been changed twice over the past 3 weeks. They are not using 4-layer compression. She came in with Kerlix/Coban. 1/23; patient presents for follow-up. We have been using Santyl and Hydrofera Blue under  4-layer compression. Patient has no issues or complaints today. 2/1; patient presents for follow-up. Patient's been using Santyl and Hydrofera Blue to the wound bed. She has developed more slough and now  a mild odor. She states she is wearing the Prevalon boot at night. It is unclear if she is offloading this area during the day. 2/13; patient presents for follow-up. Patient's been using Dakin's wet-to-dry dressings. She received her Keystone antibiotic ointment in the mail. She has not used this yet. She reports using her Prevalon boot. She has no issues or complaints today. 2/20; patient presents for follow-up. She has been using Keystone antibiotic ointment with Hydrofera Blue. She has no issues or complaints today. Due to transportation she cannot do the total contact cast today. She is scheduled for next week to have this placed. 2/27; patient presents for follow-up. Plan is for the total contact cast today. We have been using Keystone and Hydrofera Blue to the wound bed. She forgot her Keystone antibiotic ointment. She has no issues or complaints today. Servidio, Janette [name] ([id number]) [id number] Page 4 of 11 2/29; patient presents for follow-up. Patient presents for her obligatory cast change. She has no issues or complaints today. 3/5; patient presents for follow-up. She has had the cast in place for the past week and has tolerated this well. She has no issues or complaints today. We have been using PolyMem with Keystone antibiotic ointment. 12/15/2022: The wound is quite clean and measured smaller today. 3/19; we have been using Keystone antibiotic ointment with PolyMem silver under the total contact cast. Overall there is been improvement in wound healing. 3/26; patient presents for follow-up. She is been approved for epi fix. She would like to proceed with this today. Previously we have been using Keystone antibiotic ointment with PolyMem silver under the total  contact cast. She has no issues or complaints. 01/05/2023: The wound is clean and she is tolerating total contact casting without difficulty. She had her first application of EpiFix last week. 4/9; patient presents for follow-up. We have been applying EpiFix under the total contact cast for the past 2 weeks. She is tolerated this well. She has no issues or complaints today. 4/16; patient presents for follow-up. We have been using EpiFix under the total contact cast. She has done well with this. Wound is smaller. She does have slough buildup. 4/23; patient presents for follow-up. We have been using EpiFix under the total contact cast. Wound appears well-healing. She does have slough buildup. 4/30; patient presents for follow-up. We have been using EpiFix under the total contact cast. Wound is smaller. She again has slough buildup. Electronic Signature(s) Signed: 02/02/2023 11:15:37 AM By: Geralyn Corwin DO Entered By: Geralyn Corwin on 02/02/2023 09:18:01 -------------------------------------------------------------------------------- Physical Exam Details Patient Name: Date of Service: [name] Bushy [name]. 02/02/2023 8:15 A M Medical Record Number: [id number] Patient Account Number: [id number] Date of Birth/Sex: Treating RN: 12-30-1960 (62 y.o. F) Primary Care Provider: [name] Other Clinician: Referring Provider: Treating Provider/Extender: [name] in Treatment: 47 Constitutional respirations regular, non-labored and within target range for patient.. Cardiovascular 2+ dorsalis pedis/posterior tibialis pulses. Psychiatric pleasant and cooperative. Notes Open wound with granulation tissue and nonviable tissue at the heel. No signs of surrounding infection. Electronic Signature(s) Signed: 02/02/2023 11:15:37 AM By: Geralyn Corwin DO Entered By: Geralyn Corwin on 02/02/2023  09:18:23 -------------------------------------------------------------------------------- Physician Orders Details Patient Name: Date of Service: [name] Bushy [name]. 02/02/2023 8:15 A M Medical Record Number: [id number] Patient Account Number: [id number] Date of Birth/Sex: Treating RN: Apr 28, 1961 (62 y.o. [name] Primary Care Provider: [name] Other Clinician: Referring Provider: Treating Provider/Extender: [name] in Treatment: [phone] Verbal /  Phone Orders: No Diagnosis Coding Follow-up Appointments ppointment in 1 week. - Dr. Mikey Bussing 02/08/23 @ 12:45 Return A Other: - Leave dressing in place. Facility not to change dressing. Anesthetic Klang, Dorrine [name] ([id number]) [id number] Page 5 of 11 (In clinic) Topical Lidocaine 5% applied to wound bed (In clinic) Topical Lidocaine 4% applied to wound bed Cellular or Tissue Based Products Cellular or Tissue Based Product Type: - Run IVR for EpiFix and Epicord=100% covered 05/07/22: Epicord ordered 12/08/2022: Run insurance for epicord and theraskin. 01/19/2023: Run for organogenesis- puraply AM daptic or Mepitel. (DO NOT REMOVE). - Cellular or Tissue Based Product applied to wound bed, secured with steri-strips, cover with A Epicord #1 05/15/25 Epicord #2 05/22/22 Epicord #3 06/29/2022 Epicord #4 07/07/2022 Epifix #5 07/14/22 Epicord #6 07/20/22 Epicord # 7 07/27/22 ***Run for Grafix***PENDING Epicord #8 08/04/2022 2024-IVR epifix approved 12/29/2022 #1 epifix applied to left heel. 01/05/2023 #2 epifix applied to left heel. 01/12/2023 #3 epifix applied to left heel 01/19/2023 #4 epifix applied to left heel. 01/26/23 Epifix #5 Bathing/ Shower/ Hygiene May shower with protection but do not get wound dressing(s) wet. Protect dressing(s) with water repellant cover (for example, large plastic bag) or a cast cover and may then take shower. Edema Control - Lymphedema / SCD /  Other Elevate legs to the level of the heart or above for 30 minutes daily and/or when sitting for 3-4 times a day throughout the day. Avoid standing for long periods of time. Off-Loading Total Contact Cast to Left Lower Extremity - size 4 Additional Orders / Instructions Follow Nutritious Diet - Monitor/Control Blood Sugar Wound Treatment Wound #2 - Calcaneus Wound Laterality: Left, Distal Cleanser: Soap and Water 1 x Per Week/30 Days Discharge Instructions: May shower and wash wound with dial antibacterial soap and water prior to dressing change. Cleanser: Wound Cleanser 1 x Per Week/30 Days Discharge Instructions: Cleanse the wound with wound cleanser prior to applying a clean dressing using gauze sponges, not tissue or cotton balls. Prim Dressing: epifix 1 x Per Week/30 Days ary Discharge Instructions: applied by provider Prim Dressing: adaptic and steri-strips 1 x Per Week/30 Days ary Discharge Instructions: secured the epifix Secondary Dressing: ABD Pad, 8x10 1 x Per Week/30 Days Discharge Instructions: Apply over primary dressing as directed. Secondary Dressing: Woven Gauze Sponge, Non-Sterile 4x4 in (Home Health) 1 x Per Week/30 Days Discharge Instructions: Apply over primary dressing as directed. Secured With: [employer], 4.5x3.1 (in/yd) 1 x Per Week/30 Days Discharge Instructions: Secure with Kerlix as directed. Electronic Signature(s) Signed: 02/02/2023 11:15:37 AM By: Geralyn Corwin DO Signed: 03/16/2023 7:49:05 AM By: Brenton Grills Entered By: Brenton Grills on 02/02/2023 10:04:25 -------------------------------------------------------------------------------- Problem List Details Patient Name: Date of Service: [name] Bushy [name]. 02/02/2023 8:15 A M Medical Record Number: [id number] Patient Account Number: [id number] Date of Birth/Sex: Treating RN: 1961-01-21 (61 y.o. F) Primary Care Provider: [name] Other Clinician: Referring Provider: Treating  Provider/Extender: [name] in Treatment: [address], Baelynn [name] ([id number]) [id number] Page 6 of 11 Active Problems ICD-10 Encounter Code Description Active Date MDM Diagnosis L97.522 Non-pressure chronic ulcer of other part of left foot with fat layer exposed 03/05/2022 No Yes E11.621 Type 2 diabetes mellitus with foot ulcer 03/05/2022 No Yes E66.01 Morbid (severe) obesity due to excess calories 03/05/2022 No Yes Inactive Problems Resolved Problems Electronic Signature(s) Signed: 02/02/2023 11:15:37 AM By: Geralyn Corwin DO Entered By: Geralyn Corwin on 02/02/2023 09:16:29 -------------------------------------------------------------------------------- Progress Note Details Patient Name: Date of Service: [name], A  MY [name]. 02/02/2023 8:15 A M Medical Record Number: [id number] Patient Account Number: [id number] Date of Birth/Sex: Treating RN: [birth year] (62 y.o. F) Primary Care Provider: [name] Other Clinician: Referring Provider: Treating Provider/Extender: [name] in Treatment: 40 Subjective Chief Complaint Information obtained from Patient 03/05/2022; left foot wound History of Present Illness (HPI) Admission 03/05/2022 [name] is a 62 year old female with a past medical history of uncontrolled insulin-dependent type 2 diabetes, tobacco user and chronic diastolic heart failure that presents to the clinic for a 8-month history of wound to her left heel. She states she had a left ankle fusion in September 2022. She states that she always had a wound after the surgery and it never healed. She has home health and they have been doing compression wraps along with silver alginate to the wound bed. She currently denies signs of infection. 6/8; this is a 63 year old woman with type 2 diabetes. She developed a wound on her left Achilles heel just above the tip of the heel in the setting  of recurrent ankle surgeries in late 2022. I have seen some of these results from either the cast or the surgical boots that are put on after these operations. She thinks this may be the case. And a sense of pressure ulcer. In any case that she has been using Santyl Hydrofera Blue under compression. She is not wearing any footwear at home as she cannot find anything to accommodate the wrap 6/22; patient presents for follow-up. She has home health that comes out once a week to help with dressing changes. She has no issues or complaints today. We have been using Hydrofera Blue under compression therapy. 7/6; patient presents for follow-up. She states that home health did not come out for the past 2 weeks. It is unclear why. We have been using Hydrofera Blue and Santyl under compression therapy. 7/13; patient presents for follow-up. She did not take the oral antibiotics prescribed at last clinic visit. We have been using Hydrofera Blue with gentamicin/mupirocin ointment under compression therapy. She has no issues or complaints today. She denies signs of infection. 7/20; patient presents for follow-up. We have been using Hydrofera Blue with antibiotic ointment under 3 layer compression. She has home health that change the dressing once. They put collagen on the wound bed. She also reports falling yesterday and hitting her right foot. She has no pain to the area today. 7/27; patient presents for follow-up. We have been using Hydrofera Blue under 3 layer compression. She has home health that changes the dressing. She has no issues or complaints today. 8/3; patient presents for follow-up. We continues to use Hydrofera Blue under 3 layer compression. She has no issues or complaints today. She has been approved for Epicord at 100%. 8/11; patient presents for follow-up. We have been using Hydrofera Blue under 3 layer compression. She has been approved for Epicord and we have this today. She is in agreement  with having this applied. 8/18; patient presents for follow-up. Epicord #1 was placed in standard fashion last clinic visit. She has no issues or complaints today. Molina, [name] ([id number]) [id number] Page 7 of 11 9/18; Unfortunately patient has missed her last clinic appointments due to falling and breaking her right ankle. We have been following her for her left ankle wound. She has also developed a large blister to the left heel over the past week that has ruptured and dried. She currently resides In [facility]. Wound9/25; patient presents for follow-up.  We have been using Hydrofera Blue and Santyl to the original left ankle wound and Xeroform to the dried blistered. We have been wrapping her with compression therapy. She resides in a facility and they changed the wrap once this past week. She has no issues or complaints today. She denies signs of infection. 10/3; patient presents for follow-up. We have been using Xeroform to the heel wound and Epicort to the left ankle wound All under compression therapy. She has no issues or complaints today. 10/10; patient presents for follow-up. We have been using Epicort to the left ankle wound and Santyl and Hydrofera Blue to the heel wound. All under compression therapy. she has no issues or complaints today. 10/16; patient presents for follow-up. Epicort was placed in standard fashion to the superior wound and onto the heel and Santyl and Hydrofera Blue. She states that the wrap got wet during her shower and her facility was able to rewrap with Kerlix/Coban. 10/23; patient presents for follow-up. Epicord was placed to the wound beds last clinic visit. We continue Kerlix/Coban. She has no issues or complaints today. 10/31; Patient presents for follow-up. Epicord was placed to the wound beds last clinic visit. The original wound is almost healed. We have done this under Kerlix/Coban. She denies signs of infection. 11/10;  patient presents for follow-up. Patient's last epi cord was placed in standard fashion at last clinic visit. The original wound has healed. She still has a heel wound. Grafix was approved however too costly for the patient. We will rerun epi cord to see if she can have more applications. She had done very well with this. We have been using Hydrofera Blue to the heel under Kerlix/Coban. 11/21; patient presents for follow-up. We have been using Hydrofera Blue and Santyl to the heel under Kerlix/Coban. She switched to a new healthcare insurance and again The skin substitute is too costly for the patient. She denies signs of infection. 11/28; patient presents for follow-up. Her facility did not change the wrap last clinic visit due to lack of staffing. The wrap was taken off and Hydrofera Blue dressing has been in place for the past week. She has no issues or complaints today. 12/5; patient presents for follow-up. She states she has had more drainage over the past week. They did not change the wrap at her facility. She states the nurse will be back this week. She is also been doing a lot of physical therapy. She has been using the Prevalon boot while in bed. 12/12; patient presents for follow-up. We have been using Hydrofera Blue with antibiotic ointment under 4-layer compression. She is tolerated the wrap well. She states she is using her Prevalon boot while in the wheelchair. She has no issues or complaints today. There is been improvement in wound healing. 12/19; patient presents for follow-up. We have been using Hydrofera Blue and antibiotic ointment to the wound bed under 4-layer compression. Her facility change the wrap once. She states she has been using the Prevalon boot in bed but not in the wheelchair due to issues with transferring. Overall there is still improvement in wound healing. 1/16; patient presents for follow-up. She missed her last clinic appointment. We have been using Hydrofera Blue  and antibiotic ointment under 4-layer compression. At the facility this has only been changed twice over the past 3 weeks. They are not using 4-layer compression. She came in with Kerlix/Coban. 1/23; patient presents for follow-up. We have been using Santyl and Hydrofera Blue under 4-layer compression.  Patient has no issues or complaints today. 2/1; patient presents for follow-up. Patient's been using Santyl and Hydrofera Blue to the wound bed. She has developed more slough and now a mild odor. She states she is wearing the Prevalon boot at night. It is unclear if she is offloading this area during the day. 2/13; patient presents for follow-up. Patient's been using [employer]'s wet-to-dry dressings. She received her Keystone antibiotic ointment in the mail. She has not used this yet. She reports using her Prevalon boot. She has no issues or complaints today. 2/20; patient presents for follow-up. She has been using Keystone antibiotic ointment with Hydrofera Blue. She has no issues or complaints today. Due to transportation she cannot do the total contact cast today. She is scheduled for next week to have this placed. 2/27; patient presents for follow-up. Plan is for the total contact cast today. We have been using Keystone and Hydrofera Blue to the wound bed. She forgot her Keystone antibiotic ointment. She has no issues or complaints today. 2/29; patient presents for follow-up. Patient presents for her obligatory cast change. She has no issues or complaints today. 3/5; patient presents for follow-up. She has had the cast in place for the past week and has tolerated this well. She has no issues or complaints today. We have been using PolyMem with Keystone antibiotic ointment. 12/15/2022: The wound is quite clean and measured smaller today. 3/19; we have been using Keystone antibiotic ointment with PolyMem silver under the total contact cast. Overall there is been improvement in wound healing. 3/26; patient  presents for follow-up. She is been approved for epi fix. She would like to proceed with this today. Previously we have been using Keystone antibiotic ointment with PolyMem silver under the total contact cast. She has no issues or complaints. 01/05/2023: The wound is clean and she is tolerating total contact casting without difficulty. She had her first application of EpiFix last week. 4/9; patient presents for follow-up. We have been applying EpiFix under the total contact cast for the past 2 weeks. She is tolerated this well. She has no issues or complaints today. 4/16; patient presents for follow-up. We have been using EpiFix under the total contact cast. She has done well with this. Wound is smaller. She does have slough buildup. 4/23; patient presents for follow-up. We have been using EpiFix under the total contact cast. Wound appears well-healing. She does have slough buildup. 4/30; patient presents for follow-up. We have been using EpiFix under the total contact cast. Wound is smaller. She again has slough buildup. Patient History Information obtained from Patient, Chart. Family History Cancer - Father,Mother, Diabetes - Mother,Maternal Grandparents,Paternal Grandparents, Heart Disease - Siblings,Maternal Grandparents, Hypertension - Siblings, Tuberculosis - Maternal Grandparents,Paternal Grandparents. Social History Gracy, Britaney [name] ([id number]) [phone].pdf Page 8 of 11 Current every day smoker, Marital Status - Single, Alcohol Use - Never, Drug Use - No History, Caffeine Use - Daily. 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 Vitals Time Taken: 8:18 AM, Height: 66 in, Weight: 339 lbs, BMI: 54.7,  Temperature: 98.6 F, Pulse: 94 bpm, Respiratory Rate: 18 breaths/min, Blood Pressure: 108/62 mmHg. Cardiovascular 2+ dorsalis pedis/posterior tibialis pulses. Psychiatric pleasant and cooperative. General Notes: Open wound with granulation tissue and nonviable tissue at the heel. No signs of surrounding infection. Integumentary (Hair, Skin) Wound #2 status is Open. Original cause of wound was Blister. The date acquired was: 06/15/2022. The wound has been in treatment 31 weeks. The wound is located on the Left,Distal Calcaneus. The wound measures 2cm length x 2.2cm width x 0.1cm depth; 3.456cm^2 area and 0.346cm^3 volume. There is Fat Layer (Subcutaneous Tissue) exposed. There is no tunneling or undermining noted. There is a medium amount of serosanguineous drainage noted. The wound margin is distinct with the outline attached to the wound base. There is large (67-100%) red, pink, hyper - granulation within the wound bed. There is a small (1- 33%) amount of necrotic tissue within the wound bed including Adherent Slough. The periwound skin appearance had no abnormalities noted for texture. The periwound skin appearance had no abnormalities noted for color. The periwound skin appearance did not exhibit: Dry/Scaly, Maceration. Periwound temperature was noted as No Abnormality. Assessment Active Problems ICD-10 Non-pressure chronic ulcer of other part of left foot with fat layer exposed Type 2 diabetes mellitus with foot ulcer Morbid (severe) obesity due to excess calories Patient's wound appears well-healing. I debrided nonviable  tissue. EpiFix was placed in standard fashion. We continue the total contact cast. Follow-up in 1 week. 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 There was a Excisional Skin/Subcutaneous Tissue Debridement with a total area of 3.45 sq cm performed by Geralyn Corwin, DO. With the following instrument(s): Curette to remove Viable and Non-Viable tissue/material. Material removed includes Subcutaneous Tissue and Slough and after achieving pain control using Lidocaine 4% T opical Solution. No specimens were taken. A time out was conducted at 08:50, prior to the start of the procedure. A Minimum amount of bleeding was controlled with Pressure. The procedure was tolerated well with a pain level of 0 throughout and a pain level of 0 following the procedure. Post Debridement Measurements: 2cm length x 2.2cm width x 0.1cm depth; 0.346cm^3 volume. Krinke, Laveah [name] ([id number]) [id number].pdf Page 9 of 11 Character of Wound/Ulcer Post Debridement is improved. Severity of Tissue Post Debridement is: Fat layer exposed. Post procedure Diagnosis Wound #2: Same as Pre-Procedure General Notes: Scribed for Dr Mikey Bussing by Brenton Grills RN.. Pre-procedure diagnosis of Wound #2 is a Diabetic Wound/Ulcer of the Lower Extremity located on the Left,Distal Calcaneus. A skin graft procedure using a bioengineered skin substitute/cellular or tissue based product was performed by Geralyn Corwin, DO with the following instrument(s): Forceps and Scissors. Epifix was applied and secured with Steri-Strips. 6 sq cm of product was utilized and 0 sq cm was wasted. Post Application, Foam Border was applied. A Time Out was conducted at 09:30, prior to the start of the procedure. The procedure was tolerated well with a pain level of 0 throughout and a pain  level of 0 following the procedure. Post procedure Diagnosis Wound #2: Same as Pre-Procedure General Notes: Scribed for Dr Mikey Bussing by Brenton Grills RN. Pre-procedure diagnosis of Wound #2 is a Diabetic Wound/Ulcer of the Lower Extremity located on the Left,Distal Calcaneus . There was a T Research scientist (life sciences) otal Procedure by Geralyn Corwin, DO. Post procedure Diagnosis Wound #2: Same as Pre-Procedure Notes: size 4.  Plan Follow-up Appointments: Return Appointment in 1 week. - Dr. Mikey Bussing 02/08/23 @ 12:45 Other: - Leave dressing in place. Facility not to change dressing. Anesthetic: (In clinic) Topical Lidocaine 5% applied to wound bed (In clinic) Topical Lidocaine 4% applied to wound bed Cellular or Tissue Based Products: Cellular or Tissue Based Product Type: - Run IVR for EpiFix and Epicord=100% covered 05/07/22: Epicord ordered 12/08/2022: Run insurance for epicord and theraskin. 01/19/2023: Run for organogenesis- puraply AM Cellular or Tissue Based Product applied to wound bed, secured with steri-strips, cover with Adaptic or Mepitel. (DO NOT REMOVE). - Epicord #1 05/15/25 Epicord #2 05/22/22 Epicord #3 06/29/2022 Epicord #4 07/07/2022 Epifix #5 07/14/22 Epicord #6 07/20/22 Epicord # 7 07/27/22 ***Run for Grafix***PENDING Epicord #8 08/04/2022 2024-IVR epifix approved 12/29/2022 #1 epifix applied to left heel. 01/05/2023 #2 epifix applied to left heel. 01/12/2023 #3 epifix applied to left heel 01/19/2023 #4 epifix applied to left heel. 01/26/23 Epifix #5 Bathing/ Shower/ Hygiene: May shower with protection but do not get wound dressing(s) wet. Protect dressing(s) with water repellant cover (for example, large plastic bag) or a cast cover and may then take shower. Edema Control - Lymphedema / SCD / Other: Elevate legs to the level of the heart or above for 30 minutes daily and/or when sitting for 3-4 times a day throughout the day. Avoid standing for long periods of time. Off-Loading: T Contact Cast to  Left Lower Extremity - size 4 otal Additional Orders / Instructions: Follow Nutritious Diet - Monitor/Control Blood Sugar WOUND #2: - Calcaneus Wound Laterality: Left, Distal Cleanser: Soap and Water 1 x Per Week/30 Days Discharge Instructions: May shower and wash wound with dial antibacterial soap and water prior to dressing change. Cleanser: Wound Cleanser 1 x Per Week/30 Days Discharge Instructions: Cleanse the wound with wound cleanser prior to applying a clean dressing using gauze sponges, not tissue or cotton balls. Prim Dressing: epifix 1 x Per Week/30 Days ary Discharge Instructions: applied by provider Prim Dressing: adaptic and steri-strips 1 x Per Week/30 Days ary Discharge Instructions: secured the epifix Secondary Dressing: ABD Pad, 8x10 1 x Per Week/30 Days Discharge Instructions: Apply over primary dressing as directed. Secondary Dressing: Woven Gauze Sponge, Non-Sterile 4x4 in (Home Health) 1 x Per Week/30 Days Discharge Instructions: Apply over primary dressing as directed. Secured With: [employer], 4.5x3.1 (in/yd) 1 x Per Week/30 Days Discharge Instructions: Secure with Kerlix as directed. 1. In office sharp debridement 2. EpiFix placed in standard fashion 3. T contact cast otal 4. Follow-up in 1 week Electronic Signature(s) Signed: 02/03/2023 1:34:29 PM By: Geralyn Corwin DO Signed: 02/03/2023 4:44:49 PM By: Shawn Stall RN, BSN Previous Signature: 02/02/2023 11:15:37 AM Version By: Geralyn Corwin DO Entered By: Shawn Stall on 02/03/2023 13:03:42 -------------------------------------------------------------------------------- HxROS Details Patient Name: Date of Service: [name], [name]. 02/02/2023 8:15 A M Medical Record Number: [id number] Patient Account Number: [id number] Date of Birth/Sex: Treating RN: 1961/02/02 (61 y.o. F) Primary Care Provider: [name] Other Clinician: Referring Provider: Treating Provider/Extender: Jhonnie Garner [place], [place] [name] ([id number]) [phone].pdf Page 10 of 11 Weeks in Treatment: 47 Information Obtained From Patient Chart Hematologic/Lymphatic Medical History: Positive for: Anemia Respiratory Medical History: Positive for: Chronic Obstructive Pulmonary Disease (COPD) Cardiovascular Medical History: Positive for: Congestive Heart Failure - Weighs self daily; Hypertension; Myocardial Infarction; Peripheral Venous Disease Gastrointestinal Medical History: Past Medical History Notes: GERD, Peptic Ulcer Disease Endocrine Medical History: Positive for: Type II Diabetes Time with diabetes: 16 years Treated with: Insulin,  Oral agents Blood sugar tested every day: Yes Tested : Twice daily Musculoskeletal Medical History: Past Medical History Notes: Broke left leg 3 years ago, 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, foot fused Neurologic Medical History: Positive for: Neuropathy Psychiatric Medical History: Past Medical History Notes: Depression Immunizations Pneumococcal Vaccine: Received Pneumococcal Vaccination: No Implantable Devices None Family and Social History Cancer: Yes - Father,Mother; Diabetes: Yes - Mother,Maternal Grandparents,Paternal Grandparents; Heart Disease: Yes - Siblings,Maternal Grandparents; Hypertension: Yes - Siblings; Tuberculosis: Yes - Maternal Grandparents,Paternal Grandparents; Current every day smoker; Marital Status - Single; Alcohol Use: Never; Drug Use: No History; Caffeine Use: Daily; Financial Concerns: No; Food, Clothing or Shelter Needs: No; Support System Lacking: No; Transportation Concerns: No Electronic Signature(s) Signed: 02/02/2023 11:15:37 AM By: Geralyn Corwin DO Entered By: Geralyn Corwin on 02/02/2023 09:18:07 -------------------------------------------------------------------------------- Total Contact Cast Details Patient Name: Date of Service: [name] Bushy [name]. 02/02/2023 8:15 A  M Medical Record Number: [id number] Patient Account Number: [id number] Mcevoy, [name] ([id number]) [phone].pdf Page 11 of 11 Date of Birth/Sex: Treating RN: 1961-03-10 (61 y.o. F) Primary Care Provider: Other Clinician: [name] Referring Provider: Treating Provider/Extender: [name] in Treatment: 84 T Contact Cast Applied for Wound Assessment: otal Wound #2 Left,Distal Calcaneus Performed By: Physician Geralyn Corwin, DO Post Procedure Diagnosis Same as Pre-procedure Notes size 4 Electronic Signature(s) Signed: 02/02/2023 11:15:37 AM By: Geralyn Corwin DO Entered By: Geralyn Corwin on 02/02/2023 09:23:18 -------------------------------------------------------------------------------- SuperBill Details Patient Name: Date of Service: [name] Bushy [name]. 02/02/2023 Medical Record Number: [id number] Patient Account Number: [id number] Date of Birth/Sex: Treating RN: 1960/11/17 (61 y.o. F) Primary Care Provider: [name] Other Clinician: Referring Provider: Treating Provider/Extender: [name] in Treatment: 47 Diagnosis Coding ICD-10 Codes Code Description [id number] Non-pressure chronic ulcer of other part of left foot with fat layer exposed E11.621 Type 2 diabetes mellitus with foot ulcer E66.01 Morbid (severe) obesity due to excess calories Facility Procedures : CPT4 Code: [id number] Description: Q4186 Epifix 2cm x 3cm ICD-10 Diagnosis Description L97.522 Non-pressure chronic ulcer of other part of left foot with fat layer exposed Modifier: Quantity: 6 : CPT4 Code: [id number] Description: 15275 - SKIN SUB GRAFT FACE/NK/HF/G ICD-10 Diagnosis Description L97.522 Non-pressure chronic ulcer of other part of left foot with fat layer exposed E11.621 Type 2 diabetes mellitus with foot ulcer Modifier: Quantity: 1 Physician Procedures : CPT4 Code Description Modifier [id number]  15275 - WC PHYS SKIN SUB GRAFT FACE/NK/HF/G ICD-10 Diagnosis Description L97.522 Non-pressure chronic ulcer of other part of left foot with fat layer exposed E11.621 Type 2 diabetes mellitus with foot ulcer Quantity: 1 Electronic Signature(s) Signed: 02/02/2023 12:48:01 PM By: Geralyn Corwin DO Signed: 03/16/2023 7:49:05 AM By: Brenton Grills Previous Signature: 02/02/2023 11:15:37 AM Version By: Geralyn Corwin DO Entered By: Brenton Grills on 02/02/2023 12:03:38

## 2023-03-16 NOTE — Progress Notes (Signed)
[name], [name] ([id number]) [id number].pdf Page 1 of 6 Visit Report for 02/02/2023 Arrival Information Details Patient Name: Date of Service: [name] 02/02/2023 8:15 A M Medical Record Number: [id number] Patient Account Number: [id number] Date of Birth/Sex: Treating RN: October 27, 1960 (62 y.o. [name] Primary Care Talani Brazee: [name] Other Clinician: Referring Elsie Sakuma: Treating Sajan Cheatwood/Extender: [name] in Treatment: 20 Visit Information History Since Last Visit All ordered tests and consults were completed: Yes Patient Arrived: [name] Added or deleted any medications: No Arrival Time: 08:17 Any new allergies or adverse reactions: No Accompanied By: self Had a fall or experienced change in No Transfer Assistance: None activities of daily living that may affect Patient Identification Verified: Yes risk of falls: Secondary Verification Process Completed: Yes Signs or symptoms of abuse/neglect since last visito No Patient Requires Transmission-Based Precautions: No Hospitalized since last visit: No Patient Has Alerts: No Implantable device outside of the clinic excluding No cellular tissue based products placed in the center since last visit: Has Dressing in Place as Prescribed: Yes Pain Present Now: Yes Electronic Signature(s) Signed: 03/16/2023 7:49:05 AM By: Brenton Grills Entered By: Brenton Grills on 02/02/2023 08:18:18 -------------------------------------------------------------------------------- Encounter Discharge Information Details Patient Name: Date of Service: [name] C. 02/02/2023 8:15 A M Medical Record Number: [id number] Patient Account Number: [id number] Date of Birth/Sex: Treating RN: 1961-02-27 (61 y.o. [name] Primary Care [name]: [name] Other Clinician: Referring [name]: Treating [name]/Extender: [name] in  Treatment: 15 Encounter Discharge Information Items Discharge Condition: Stable Ambulatory Status: Wheelchair Discharge Destination: Home Transportation: Private Auto Accompanied By: self Schedule Follow-up Appointment: Yes Clinical Summary of Care: Patient Declined Electronic Signature(s) Signed: 03/16/2023 7:49:05 AM By: Brenton Grills Entered By: Brenton Grills on 02/02/2023 09:45:42 -------------------------------------------------------------------------------- Lower Extremity Assessment Details Patient Name: Date of Service: [name] C. 02/02/2023 8:15 A M Medical Record Number: [id number] Patient Account Number: [id number] Date of Birth/Sex: Treating RN: 09/10/1961 (62 y.o. [name] Primary Care [name]: [name] Other Clinician: Referring [name]: Treating [name]/Extender: [name] in Treatment: 47 Edema Assessment H[Left: [name], [name] ([id number])] [name]: [id number].pdf Page 2 of 6] Assessed: [Left: No] [Right: No] Edema: [Left: Ye] [Right: s] Calf Left: Right: Point of Measurement: 28 cm From Medial Instep 46.4 cm Ankle Left: Right: Point of Measurement: 3 cm From Medial Instep 31 cm Vascular Assessment Pulses: Dorsalis Pedis Palpable: [Left:Yes] Electronic Signature(s) Signed: 03/16/2023 7:49:05 AM By: Brenton Grills Entered By: Brenton Grills on 02/02/2023 08:49:58 -------------------------------------------------------------------------------- Multi Wound Chart Details Patient Name: Date of Service: [name] C. 02/02/2023 8:15 A M Medical Record Number: [id number] Patient Account Number: [id number] Date of Birth/Sex: Treating RN: 1961/06/11 (62 y.o. F) Primary Care Brogan England: [name] Other Clinician: Referring Shannette Tabares: Treating Callaghan Laverdure/Extender: [name] in Treatment: 47 Vital Signs Height(in): 66 Pulse(bpm):  94 Weight(lbs): 339 Blood Pressure(mmHg): 108/62 Body Mass Index(BMI): 54.7 Temperature(F): 98.6 Respiratory Rate(breaths/min): 18 [2:Photos:] [N/A:N/A] Left, Distal Calcaneus N/A N/A Wound Location: Blister N/A N/A Wounding Event: Diabetic Wound/Ulcer of the Lower N/A N/A Primary Etiology: Extremity Anemia, Chronic Obstructive N/A N/A Comorbid History: Pulmonary Disease (COPD), Congestive Heart Failure, Hypertension, Myocardial Infarction, Peripheral Venous Disease, Type II Diabetes, Neuropathy 06/15/2022 N/A N/A Date Acquired: 31 N/A N/A Weeks of Treatment: Open N/A N/A Wound Status: No N/A N/A Wound Recurrence: 2x2.2x0.1 N/A N/A Measurements L x W x D (cm) 3.456 N/A N/A A (cm) : rea 0.346 N/A N/A Volume (  cm) : 75.60% N/A N/A % Reduction in A rea: 75.50% N/A N/A % Reduction in Volume: Grade 2 N/A N/A Classification: Medium N/A N/A Exudate A mount: Serosanguineous N/A N/A Exudate Type: red, brown N/A N/A Exudate Color: Distinct, outline attached N/A N/A Wound Margin: [name], [name] ([id number]) [id number] Page 3 of 6 Large (67-100%) N/A N/A Granulation Amount: Red, Pink, Hyper-granulation N/A N/A Granulation Quality: Small (1-33%) N/A N/A Necrotic Amount: Fat Layer (Subcutaneous Tissue): Yes N/A N/A Exposed Structures: Fascia: No Tendon: No Muscle: No Joint: No Bone: No Medium (34-66%) N/A N/A Epithelialization: No Abnormalities Noted N/A N/A Periwound Skin Texture: Maceration: No N/A N/A Periwound Skin Moisture: Dry/Scaly: No No Abnormalities Noted N/A N/A Periwound Skin Color: No Abnormality N/A N/A Temperature: Treatment Notes Electronic Signature(s) Signed: 02/02/2023 11:15:37 AM By: Geralyn Corwin DO Entered By: Geralyn Corwin on 02/02/2023 09:16:35 -------------------------------------------------------------------------------- Multi-Disciplinary Care Plan Details Patient Name: Date of Service: [name] C. 02/02/2023 8:15 A M Medical Record Number: [id number] Patient Account Number: [id number] Date of Birth/Sex: Treating RN: [birth year] (62 y.o. [name] Primary Care Altamese Deguire: [name] Other Clinician: Referring Dominico Rod: Treating Blayne Garlick/Extender: [name] in Treatment: 43 Active Inactive Abuse / Safety / Falls / Self Care Management Nursing Diagnoses: History of Falls Goals: Patient will not experience any injury related to falls Date Initiated: 03/05/2022 Target Resolution Date: 02/05/2023 Goal Status: Active Interventions: Assess fall risk on admission and as needed Assess: immobility, friction, shearing, incontinence upon admission and as needed Assess impairment of mobility on admission and as needed per policy Notes: 04/30/22: Fall prevention ongoing, recent fall. Wound/Skin Impairment Nursing Diagnoses: Impaired tissue integrity Goals: Patient/caregiver will verbalize understanding of skin care regimen Date Initiated: 03/05/2022 Target Resolution Date: 02/06/2023 Goal Status: Active Ulcer/skin breakdown will have a volume reduction of 30% by week 4 Date Initiated: 03/05/2022 Date Inactivated: 04/30/2022 Target Resolution Date: 05/02/2022 Goal Status: Met Ulcer/skin breakdown will have a volume reduction of 50% by week 8 Date Initiated: 04/30/2022 Date Inactivated: 07/07/2022 Target Resolution Date: 07/04/2022 Unmet Reason: patient sent three Goal Status: Unmet weeks inpatient and rehab before returning to wound center. Interventions: Assess patient/caregiver ability to obtain necessary supplies Assess patient/caregiver ability to perform ulcer/skin care regimen upon admission and as needed Choquette, Raja C ([id number]) [phone].pdf Page 4 of 6 Assess ulceration(s) every visit Provide education on smoking Provide education on ulcer and skin care Treatment Activities: Topical wound management  initiated : 03/05/2022 Notes: 04/30/22: Wound care regimen continues. Electronic Signature(s) Signed: 03/16/2023 7:49:05 AM By: Brenton Grills Entered By: Brenton Grills on 02/02/2023 08:59:16 -------------------------------------------------------------------------------- Pain Assessment Details Patient Name: Date of Service: [name] C. 02/02/2023 8:15 A M Medical Record Number: [id number] Patient Account Number: [id number] Date of Birth/Sex: Treating RN: April 01, 1961 (62 y.o. [name] Primary Care Verner Mccrone: [name] Other Clinician: Referring Graves Nipp: Treating Almarie Kurdziel/Extender: [name]1 Active Problems Location of Pain Severity and Description of Pain Patient Has Paino Yes Site Locations Duration of the Pain. Constant / Intermittento Constant Rate the pain. Current Pain Level: 3 Worst Pain Level: 3 Least Pain Level: 3 Pain Management and Medication Current Pain Management: Electronic Signature(s) Signed: 03/16/2023 7:49:05 AM By: Brenton Grills Entered By: Brenton Grills on 02/02/2023 08:49:41 -------------------------------------------------------------------------------- Patient/Caregiver Education Details Patient Name: Date of Service: [name]. 4/30/2024andnbsp8:15 A M Medical Record Number: [id number] Patient Account Number: [id number] Date of Birth/Gender: Treating RN: June 10, 1961 (62 y.o. [name] Primary Care  Physician: [name] Other Clinician: Referring Physician: Treating Physician/Extender: [name] Education Assessment Education Provided ToBRIGHTLY, DABU ([id number]) [id number] Page 5 of 6 Patient Education Topics Provided Wound/Skin Impairment: Methods: Explain/Verbal Responses: State content correctly Electronic Signature(s) Signed: 03/16/2023 7:49:05 AM By: Brenton Grills Entered  By: Brenton Grills on 02/02/2023 08:59:36 -------------------------------------------------------------------------------- Wound Assessment Details Patient Name: Date of Service: [name] C. 02/02/2023 8:15 A M Medical Record Number: [id number] Patient Account Number: [id number] Date of Birth/Sex: Treating RN: [birth year] (62 y.o. [name] Primary Care Arrian Manson: [name] Other Clinician: Referring Aynslee Mulhall: Treating Baili Stang/Extender: [name] in Treatment: 47 Wound Status Wound Number: 2 Primary Diabetic Wound/Ulcer of the Lower Extremity Etiology: Wound Location: Left, Distal Calcaneus Wound Open Wounding Event: Blister Status: Date Acquired: 06/15/2022 Comorbid Anemia, Chronic Obstructive Pulmonary Disease (COPD), Weeks Of Treatment: 31 History: Congestive Heart Failure, Hypertension, Myocardial Infarction, Clustered Wound: No Peripheral Venous Disease, Type II Diabetes, Neuropathy Photos Wound Measurements Length: (cm) 2 Width: (cm) 2.2 Depth: (cm) 0.1 Area: (cm) 3.456 Volume: (cm) 0.346 % Reduction in Area: 75.6% % Reduction in Volume: 75.5% Epithelialization: Medium (34-66%) Tunneling: No Undermining: No Wound Description Classification: Grade 2 Wound Margin: Distinct, outline attached Exudate Amount: Medium Exudate Type: Serosanguineous Exudate Color: red, brown Foul Odor After Cleansing: No Slough/Fibrino Yes Wound Bed Granulation Amount: Large (67-100%) Exposed Structure Granulation Quality: Red, Pink, Hyper-granulation Fascia Exposed: No Necrotic Amount: Small (1-33%) Fat Layer (Subcutaneous Tissue) Exposed: Yes Necrotic Quality: Adherent Slough Tendon Exposed: No Muscle Exposed: No Joint Exposed: No Bone Exposed: No Periwound Skin Texture Texture Color No Abnormalities Noted: Yes No Abnormalities Noted: Yes Standing, Sher C ([id number]) [id number] Page 6 of 6 Moisture  Temperature / Pain No Abnormalities Noted: No Temperature: No Abnormality Dry / Scaly: No Maceration: No Electronic Signature(s) Signed: 03/16/2023 7:49:05 AM By: Brenton Grills Entered By: Brenton Grills on 02/02/2023 08:53:02 -------------------------------------------------------------------------------- Vitals Details Patient Name: Date of Service: [name] C. 02/02/2023 8:15 A M Medical Record Number: [id number] Patient Account Number: [id number] Date of Birth/Sex: Treating RN: [birth year] (62 y.o. [name] Primary Care Unnamed Hino: [name] Other Clinician: Referring Navjot Pilgrim: Treating Rickelle Sylvestre/Extender: [name] in Treatment: 47 Vital Signs Time Taken: 08:18 Temperature (F): 98.6 Height (in): 66 Pulse (bpm): 94 Weight (lbs): 339 Respiratory Rate (breaths/min): 18 Body Mass Index (BMI): 54.7 Blood Pressure (mmHg): 108/62 Reference Range: 80 - 120 mg / dl Electronic Signature(s) Signed: 03/16/2023 7:49:05 AM By: Brenton Grills Entered By: Brenton Grills on 02/02/2023 08:21:43

## 2023-03-16 NOTE — Progress Notes (Signed)
[name], [name] ([id number]) [id number].pdf Page 1 of 7 Visit Report for 03/16/2023 Arrival Information Details Patient Name: Date of Service: [name]. 03/16/2023 8:00 [name] Medical Record Number: [id number] Patient Account Number: [id number] Date of Birth/Sex: Treating RN: 1961-03-01 (62 y.o. [name], [name] Primary Care Bertie Simien: [name] Other Clinician: Referring Genita Nilsson: Treating Chayce Robbins/Extender: [name] in Treatment: 42 Visit Information History Since Last Visit Added or deleted any medications: No Patient Arrived: Wheel Chair Any new allergies or adverse reactions: No Arrival Time: 08:15 Had [name] fall or experienced change in No Accompanied By: self activities of daily living that may affect Transfer Assistance: Manual risk of falls: Patient Identification Verified: Yes Signs or symptoms of abuse/neglect since last visito No Secondary Verification Process Completed: Yes Hospitalized since last visit: No Patient Requires Transmission-Based Precautions: No Implantable device outside of the clinic excluding No Patient Has Alerts: No cellular tissue based products placed in the center since last visit: Has Dressing in Place as Prescribed: Yes Has Footwear/Offloading in Place as Prescribed: Yes Left: T Contact Cast otal Pain Present Now: No Electronic Signature(s) Signed: 03/16/2023 9:14:28 AM By: Shawn Stall RN, BSN Entered By: Shawn Stall on 03/16/2023 08:19:01 -------------------------------------------------------------------------------- Encounter Discharge Information Details Patient Name: Date of Service: [name]. 03/16/2023 8:00 [name] Medical Record Number: [id number] Patient Account Number: [id number] Date of Birth/Sex: Treating RN: 1961-05-22 (61 y.o. [name] Primary Care Grete Bosko: [name] Other Clinician: Referring Tyre Beaver: Treating Latesha Chesney/Extender: [name] in Treatment: 82 Encounter Discharge Information Items Post Procedure Vitals Discharge Condition: Stable Temperature (F): 98.3 Ambulatory Status: Wheelchair Pulse (bpm): 88 Discharge Destination: Home Respiratory Rate (breaths/min): 16 Transportation: Private Auto Blood Pressure (mmHg): 144/77 Accompanied By: self Schedule Follow-up Appointment: Yes Clinical Summary of Care: Electronic Signature(s) Signed: 03/16/2023 9:14:28 AM By: Shawn Stall RN, BSN Entered By: Shawn Stall on 03/16/2023 08:44:06 -------------------------------------------------------------------------------- Lower Extremity Assessment Details Patient Name: Date of Service: [name], [name]. 03/16/2023 8:00 [name] Medical Record Number: [id number] Patient Account Number: [id number] Date of Birth/Sex: Treating RN: 02-10-1961 (61 y.o. [name], [name] Primary Care Tallyn Holroyd: [name] Other Clinician: Referring Vuk Skillern: Treating Jerlisa Diliberto/Extender: [name]3 Edema Assessment Left: [Left: Right] [Right: :] Assessed: [Left: Yes] [Right: No] Edema: [Left: Ye] [Right: s] Calf Left: Right: Point of Measurement: 28 cm From Medial Instep 48 cm Ankle Left: Right: Point of Measurement: 3 cm From Medial Instep 29 cm Vascular Assessment Pulses: Dorsalis Pedis Palpable: [Left:Yes] Electronic Signature(s) Signed: 03/16/2023 9:14:28 AM By: Shawn Stall RN, BSN Entered By: Shawn Stall on 03/16/2023 08:25:02 -------------------------------------------------------------------------------- Multi Wound Chart Details Patient Name: Date of Service: [name]. 03/16/2023 8:00 [name] Medical Record Number: [id number] Patient Account Number: [id number] Date of Birth/Sex: Treating RN: 1961-09-22 (61 y.o. F) Primary Care Alesana Magistro: [name] Other Clinician: Referring Roshaunda Starkey: Treating Charlayne Vultaggio/Extender: [name]3 Vital Signs Height(in): 66 Capillary Blood Glucose(mg/dl): 536 Weight(lbs): 644 Pulse(bpm): 88 Body Mass Index(BMI): 54.7 Blood Pressure(mmHg): 144/77 Temperature(F): 98.3 Respiratory Rate(breaths/min): 16 [2:Photos: No Photos Left, Distal Calcaneus Wound Location: Blister Wounding Event: Diabetic Wound/Ulcer of the Lower Primary Etiology: Extremity Anemia, Chronic Obstructive Comorbid History: Pulmonary Disease (COPD), Congestive Heart Failure, Hypertension,  Myocardial Infarction, Peripheral Venous Disease, Type II Diabetes, Neuropathy 06/15/2022 Date Acquired: 37 Weeks of Treatment: Open Wound Status: No Wound Recurrence: 1x1.2x0.1 Measurements L x W x D (cm) 0.942 [name] (cm) :  rea 0.094 Volume (cm) : 93.30% %  Reduction in [name] rea: 93.40% % Reduction in Volume: Grade 2 Classification: Medium Exudate [name] mount: Serosanguineous Exudate Type: red, brown Exudate Color: Distinct, outline attached Wound Margin: Large (67-100%) Granulation [name] mount: Red, Pink Granulation  Quality: Small (1-33%) Necrotic [name] mount: Fat Layer (Subcutaneous Tissue): Yes [name] Exposed Structures: Fascia: No Tendon: No Muscle: No Joint: No Bone: No] [[name]:[name]] [name], [name] ([id number]) [2:Medium (34-66%) Epithelialization: Debridement - Excisional Debridement: 08:35 Pre-procedure Verification/Time Out Taken: Lidocaine 4% Topical Solution Pain Control: Callus, Subcutaneous, Slough Tissue Debrided: Skin/Subcutaneous Tissue Level: 0.94  Debridement [name] (sq cm): rea Curette Instrument: Minimum Bleeding: Pressure Hemostasis [name] chieved: 0 Procedural Pain: 0 Post Procedural Pain: Procedure was tolerated well Debridement Treatment Response: 1x1.2x0.1 Post Debridement Measurements L x W x D (cm)  0.094 Post Debridement Volume: (cm) No Abnormalities Noted Periwound Skin Texture: Maceration: No Periwound Skin  Moisture: Dry/Scaly: No No Abnormalities Noted Periwound Skin Color: No Abnormality Temperature: Cellular or Tissue Based Product Procedures  Performed: Debridement] [[name]:[name]] Treatment Notes Wound #2 (Calcaneus) Wound Laterality: Left, Distal Cleanser Soap and Water Discharge Instruction: May shower and wash wound with dial antibacterial soap and water prior to dressing change. Wound Cleanser Discharge Instruction: Cleanse the wound with wound cleanser prior to applying [name] clean dressing using gauze sponges, not tissue or cotton balls. Peri-Wound Care Topical Primary Dressing epifix Discharge Instruction: applied by Vinette Crites adaptic and steri-strips Discharge Instruction: secured the epifix Secondary Dressing ABD Pad, 8x10 Discharge Instruction: Apply over primary dressing as directed. Woven Gauze Sponge, Non-Sterile 4x4 in Discharge Instruction: Apply over primary dressing as directed. Secured With [employer], 4.5x3.1 (in/yd) Discharge Instruction: Secure with Kerlix as directed. Compression Wrap Compression Stockings Add-Ons TCC size 4 Discharge Instruction: last layer applied by physician. Electronic Signature(s) Unsigned Entered By: Geralyn Corwin on 03/16/2023 09:26:47 -------------------------------------------------------------------------------- Multi-Disciplinary Care Plan Details Patient Name: Date of Service: [name]. 03/16/2023 8:00 [name] Medical Record Number: [id number] Patient Account Number: [id number] [name], [name] ([id number]) [id number] Page 4 of 7 Date of Birth/Sex: Treating RN: 06-11-1961 (61 y.o. [name] Primary Care Soni Kegel: Other Clinician: [name] Referring Kaelen Caughlin: Treating Gwendloyn Forsee/Extender: [name]9 Active Inactive Abuse / Safety / Falls / Self Care  Management Nursing Diagnoses: History of Falls Goals: Patient will not experience any injury related to falls Date Initiated: 03/05/2022 Target Resolution Date: 04/02/2023 Goal Status: Active Interventions: Assess fall risk on admission and as needed Assess: immobility, friction, shearing, incontinence upon admission and as needed Assess impairment of mobility on admission and as needed per policy Notes: 04/30/22: Fall prevention ongoing, recent fall. Wound/Skin Impairment Nursing Diagnoses: Impaired tissue integrity Goals: Patient/caregiver will verbalize understanding of skin care regimen Date Initiated: 03/05/2022 Target Resolution Date: 04/02/2023 Goal Status: Active Ulcer/skin breakdown will have [name] volume reduction of 30% by week 4 Date Initiated: 03/05/2022 Date Inactivated: 04/30/2022 Target Resolution Date: 05/02/2022 Goal Status: Met Ulcer/skin breakdown will have [name] volume reduction of 50% by week 8 Date Initiated: 04/30/2022 Date Inactivated: 07/07/2022 Target Resolution Date: 07/04/2022 Unmet Reason: patient sent three Goal Status: Unmet weeks inpatient and rehab before returning to wound center. Interventions: Assess patient/caregiver ability to obtain necessary supplies Assess patient/caregiver ability to perform  ulcer/skin care regimen upon admission and as needed Assess ulceration(s) every visit Provide education on smoking Provide education on ulcer and skin care Treatment Activities: Topical wound management initiated : 03/05/2022 Notes: 04/30/22: Wound care regimen continues. Electronic Signature(s) Signed: 03/16/2023 9:14:28 AM By: Shawn Stall RN, BSN Entered By: Shawn Stall on 03/16/2023 08:33:26 -------------------------------------------------------------------------------- Pain Assessment Details Patient Name: Date of Service: [name], [name]. 03/16/2023 8:00 [name] Medical Record Number: [id number] Patient Account Number: [id number] Date of Birth/Sex:  Treating RN: 31-Mar-1961 (61 y.o. [name], [name] Primary Care Shirley Decamp: [name] Other Clinician: Referring Odessa Morren: Treating Jarquez Mestre/Extender: [name] in Treatment: 35 Active Problems [name], [name] ([id number]) [id number] Page 5 of 7 Location of Pain Severity and Description of Pain Patient Has Paino No Site Locations Pain Management and Medication Current Pain Management: Medication: No Cold Application: No Rest: No Massage: No Activity: No T.E.[name].S.: No Heat Application: No Leg drop or elevation: No Is the Current Pain Management Adequate: Adequate How does your wound impact your activities of daily livingo Sleep: No Bathing: No Appetite: No Relationship With Others: No Bladder Continence: No Emotions: No Bowel Continence: No Work: No Toileting: No Drive: No Dressing: No Hobbies: No Electronic Signature(s) Signed: 03/16/2023 9:14:28 AM By: Shawn Stall RN, BSN Entered By: Shawn Stall on 03/16/2023 08:19:43 -------------------------------------------------------------------------------- Patient/Caregiver Education Details Patient Name: Date of Service: [name]. 6/11/2024andnbsp8:00 [name] Medical Record Number: [id number] Patient Account Number: [id number] Date of Birth/Gender: Treating RN: 1960-12-21 (62 y.o. [name] Primary Care Physician: [name] Other Clinician: Referring Physician: Treating Physician/Extender: [name] in Treatment: 23 Education Assessment Education Provided To: Patient Education Topics Provided Wound/Skin Impairment: Handouts: Caring for Your Ulcer Methods: Explain/Verbal Responses: State content correctly Electronic Signature(s) Signed: 03/16/2023 9:14:28 AM By: Shawn Stall RN, BSN Entered By: Shawn Stall on 03/16/2023 08:33:38 [name], [name] ([id number]) [id number] Page 6  of 7 -------------------------------------------------------------------------------- Wound Assessment Details Patient Name: Date of Service: [name]. 03/16/2023 8:00 [name] Medical Record Number: [id number] Patient Account Number: [id number] Date of Birth/Sex: Treating RN: May 30, 1961 (62 y.o. [name], [name] Primary Care Jarryn Altland: [name] Other Clinician: Referring Nadea Kirkland: Treating Jameil Whitmoyer/Extender: [name]3 Wound Status Wound Number: 2 Primary Diabetic Wound/Ulcer of the Lower Extremity Etiology: Wound Location: Left, Distal Calcaneus Wound Open Wounding Event: Blister Status: Date Acquired: 06/15/2022 Comorbid Anemia, Chronic Obstructive Pulmonary Disease (COPD), Weeks Of Treatment: 37 History: Congestive Heart Failure, Hypertension, Myocardial Infarction, Clustered Wound: No Peripheral Venous Disease, Type II Diabetes, Neuropathy Wound Measurements Length: (cm) 1 Width: (cm) 1.2 Depth: (cm) 0.1 Area: (cm) 0.942 Volume: (cm) 0.094 % Reduction in Area: 93.3% % Reduction in Volume: 93.4% Epithelialization: Medium (34-66%) Tunneling: No Undermining: No Wound Description Classification: Grade 2 Wound Margin: Distinct, outline attached Exudate Amount: Medium Exudate Type: Serosanguineous Exudate Color: red, brown Foul Odor After Cleansing: No Slough/Fibrino Yes Wound Bed Granulation Amount: Large (67-100%) Exposed Structure Granulation Quality: Red, Pink Fascia Exposed: No Necrotic Amount: Small (1-33%) Fat Layer (Subcutaneous Tissue) Exposed: Yes Necrotic Quality: Adherent Slough Tendon Exposed: No Muscle Exposed: No Joint Exposed: No Bone Exposed: No Periwound Skin Texture Texture Color No Abnormalities Noted: Yes No Abnormalities Noted: Yes Moisture Temperature / Pain No Abnormalities Noted: No Temperature: No Abnormality Dry / Scaly: No Maceration: No Treatment Notes Wound #2  (Calcaneus) Wound Laterality: Left, Distal Cleanser Soap and Water Discharge Instruction: May shower and wash wound with dial antibacterial  soap and water prior to dressing change. Wound Cleanser Discharge Instruction: Cleanse the wound with wound cleanser prior to applying [name] clean dressing using gauze sponges, not tissue or cotton balls. Peri-Wound Care Topical Primary Dressing epifix Discharge Instruction: applied by Mako Pelfrey adaptic and steri-strips Discharge Instruction: secured the epifix Secondary Dressing ABD Pad, 8x10 Discharge Instruction: Apply over primary dressing as directed. [name], [name] ([id number]) [id number] Page 7 of 7 Woven Gauze Sponge, Non-Sterile 4x4 in Discharge Instruction: Apply over primary dressing as directed. Secured With [employer], 4.5x3.1 (in/yd) Discharge Instruction: Secure with Kerlix as directed. Compression Wrap Compression Stockings Add-Ons TCC size 4 Discharge Instruction: last layer applied by physician. Electronic Signature(s) Signed: 03/16/2023 9:14:28 AM By: Shawn Stall RN, BSN Entered By: Shawn Stall on 03/16/2023 08:27:38 -------------------------------------------------------------------------------- Vitals Details Patient Name: Date of Service: [name], [name]. 03/16/2023 8:00 [name] Medical Record Number: [id number] Patient Account Number: [id number] Date of Birth/Sex: Treating RN: October 11, 1960 (61 y.o. [name], [name] Primary Care Seara Hinesley: [name] Other Clinician: Referring Tyrion Glaude: Treating Lucile Didonato/Extender: [name]3 Vital Signs Time Taken: 08:18 Temperature (F): 98.3 Height (in): 66 Pulse (bpm): 88 Weight (lbs): 339 Respiratory Rate (breaths/min): 16 Body Mass Index (BMI): 54.7 Blood Pressure (mmHg): 144/77 Capillary Blood Glucose (mg/dl): 782 Reference Range: 80 - 120 mg / dl Electronic Signature(s) Signed: 03/16/2023  9:14:28 AM By: Shawn Stall RN, BSN Entered By: Shawn Stall on 03/16/2023 08:19:31

## 2023-03-17 NOTE — Progress Notes (Signed)
Broberg, Abreanna [name] ([id number]) [id number] Page 1 of 11 Visit Report for 03/16/2023 Chief Complaint Document Details Patient Name: Date of Service: [name]. 03/16/2023 8:00 [name] Medical Record Number: [id number] Patient Account Number: [id number] Date of Birth/Sex: Treating RN: Nov 22, 1960 (62 y.o. F) Primary Care Provider: Junious [name] Other Clinician: Referring Provider: Treating Provider/Extender: [name] in Treatment: 78 Information Obtained from: Patient Chief Complaint 03/05/2022; left foot wound Electronic Signature(s) Signed: 03/16/2023 3:34:20 PM By: [name] Corwin DO Entered By: [name] on 03/16/2023 09:27:03 -------------------------------------------------------------------------------- Cellular or Tissue Based Product Details Patient Name: Date of Service: [name], [name]. 03/16/2023 8:00 [name] Medical Record Number: [id number] Patient Account Number: [id number] Date of Birth/Sex: Treating RN: [birth year] (62 y.o. [name], [name] Primary Care Provider: Junious [name] Other Clinician: Referring Provider: Treating Provider/Extender: [name] in Treatment: 65 Cellular or Tissue Based Product Type Wound #2 Left,Distal Calcaneus Applied to: Performed By: Physician [name] Corwin, DO Cellular or Tissue Based Product Type: Epifix Level of Consciousness (Pre-procedure): Awake and Alert Pre-procedure Verification/Time Out Yes - 08:41 Taken: Location: genitalia / hands / feet / multiple digits Wound Size (sq cm): 1.2 Product Size (sq cm): 2 Waste Size (sq cm): 0 Amount of Product Applied (sq cm): 2 Instrument Used: Forceps, Scissors Lot #: [id number] Order #: 12 Expiration Date: 10/06/2027 Fenestrated: No Reconstituted: Yes Solution Type: Normal saline Solution Amount: 3mL Lot #: [id number] Solution Expiration Date: 03/04/2025 Secured: Yes Secured  With: Steri-Strips, adaptic Dressing Applied: No Procedural Pain: 0 Post Procedural Pain: 0 Response to Treatment: Procedure was tolerated well Level of Consciousness (Post- Awake and Alert procedure): Post Procedure Diagnosis Same as Pre-procedure Electronic Signature(s) Signed: 03/16/2023 9:14:28 AM By: [name] Stall RN, BSN Signed: 03/16/2023 3:34:20 PM By: [name] Corwin DO Entered By: [name] on 03/16/2023 08:42:24 [name], [name] ([id number]) [id number] Page 2 of 11 -------------------------------------------------------------------------------- Debridement Details Patient Name: Date of Service: [name]. 03/16/2023 8:00 [name] Medical Record Number: [id number] Patient Account Number: [id number] Date of Birth/Sex: Treating RN: [birth year] (62 y.o. [name], [name] Primary Care Provider: Junious [name] Other Clinician: Referring Provider: Treating Provider/Extender: [name] in Treatment: 53 Debridement Performed for Assessment: Wound #2 Left,Distal Calcaneus Performed By: Physician [name] Corwin, DO Debridement Type: Debridement Severity of Tissue Pre Debridement: Fat layer exposed Level of Consciousness (Pre-procedure): Awake and Alert Pre-procedure Verification/Time Out Yes - 08:35 Taken: Start Time: 08:39 Pain Control: Lidocaine 4% T opical Solution Percent of Wound Bed Debrided: 100% T Area Debrided (cm): otal 0.94 Tissue and other material debrided: Viable, Non-Viable, Callus, Slough, Subcutaneous, Skin: Dermis , Skin: Epidermis, Slough Level: Skin/Subcutaneous Tissue Debridement Description: Excisional Instrument: Curette Bleeding: Minimum Hemostasis Achieved: Pressure End Time: 08:40 Procedural Pain: 0 Post Procedural Pain: 0 Response to Treatment: Procedure was tolerated well Level of Consciousness (Post- Awake and Alert procedure): Post Debridement Measurements of Total  Wound Length: (cm) 1 Width: (cm) 1.2 Depth: (cm) 0.1 Volume: (cm) 0.094 Character of Wound/Ulcer Post Debridement: Improved Severity of Tissue Post Debridement: Fat layer exposed Post Procedure Diagnosis Same as Pre-procedure Electronic Signature(s) Signed: 03/16/2023 9:14:28 AM By: [name] Stall RN, BSN Signed: 03/16/2023 3:34:20 PM By: [name] Corwin DO Entered By: [name] on 03/16/2023 08:40:24 -------------------------------------------------------------------------------- HPI Details Patient Name: Date of Service: [name], [name]. 03/16/2023 8:00 [name] Medical Record Number: [id number] Patient Account Number: [id number] Date of Birth/Sex: Treating RN: 05/23/1961 (61 y.o. F) Primary Care Provider: Orvan [name],  [name] Other Clinician: Referring Provider: Treating Provider/Extender: [name] in Treatment: 32 History of Present Illness HPI Description: Admission 03/05/2022 [name] is [name] 62 year old female with [name] past medical history of uncontrolled insulin-dependent type 2 diabetes, tobacco user and chronic diastolic heart failure that presents to the clinic for [name] 70-month history of wound to her left heel. She states she had [name] left ankle fusion in September 2022. She states that she always had [name] wound after the surgery and it never healed. She has home health and they have been doing compression wraps along with silver alginate to the wound bed. She currently denies signs of infection. 6/8; this is [name] 62 year old woman with type 2 diabetes. She developed [name] wound on her left Achilles heel just above the tip of the heel in the setting of recurrent ankle surgeries in late 2022. I have seen some of these results from either the cast or the surgical boots that are put on after these operations. She thinks this may be the case. And [name] sense of pressure ulcer. In any case that she has been using Santyl Hydrofera Blue under compression. She is not wearing  any Hoiland, Azalee [name] ([id number]) [phone].pdf Page 3 of 11 footwear at home as she cannot find anything to accommodate the wrap 6/22; patient presents for follow-up. She has home health that comes out once [name] week to help with dressing changes. She has no issues or complaints today. We have been using Hydrofera Blue under compression therapy. 7/6; patient presents for follow-up. She states that home health did not come out for the past 2 weeks. It is unclear why. We have been using Hydrofera Blue and Santyl under compression therapy. 7/13; patient presents for follow-up. She did not take the oral antibiotics prescribed at last clinic visit. We have been using Hydrofera Blue with gentamicin/mupirocin ointment under compression therapy. She has no issues or complaints today. She denies signs of infection. 7/20; patient presents for follow-up. We have been using Hydrofera Blue with antibiotic ointment under 3 layer compression. She has home health that change the dressing once. They put collagen on the wound bed. She also reports falling yesterday and hitting her right foot. She has no pain to the area today. 7/27; patient presents for follow-up. We have been using Hydrofera Blue under 3 layer compression. She has home health that changes the dressing. She has no issues or complaints today. 8/3; patient presents for follow-up. We continues to use Hydrofera Blue under 3 layer compression. She has no issues or complaints today. She has been approved for Epicord at 100%. 8/11; patient presents for follow-up. We have been using Hydrofera Blue under 3 layer compression. She has been approved for Epicord and we have this today. She is in agreement with having this applied. 8/18; patient presents for follow-up. Epicord #1 was placed in standard fashion last clinic visit. She has no issues or complaints today. 9/18; Unfortunately patient has missed her last clinic appointments due to  falling and breaking her right ankle. We have been following her for her left ankle wound. She has also developed [name] large blister to the left heel over the past week that has ruptured and dried. She currently resides In [facility]. Wound9/25; patient presents for follow-up. We have been using Hydrofera Blue and Santyl to the original left ankle wound and Xeroform to the dried blistered. We have been wrapping her with compression therapy. She resides in [name] facility and they changed the  wrap once this past week. She has no issues or complaints today. She denies signs of infection. 10/3; patient presents for follow-up. We have been using Xeroform to the heel wound and Epicort to the left ankle wound All under compression therapy. She has no issues or complaints today. 10/10; patient presents for follow-up. We have been using Epicort to the left ankle wound and Santyl and Hydrofera Blue to the heel wound. All under compression therapy. she has no issues or complaints today. 10/16; patient presents for follow-up. Epicort was placed in standard fashion to the superior wound and onto the heel and Santyl and Hydrofera Blue. She states that the wrap got wet during her shower and her facility was able to rewrap with Kerlix/Coban. 10/23; patient presents for follow-up. Epicord was placed to the wound beds last clinic visit. We continue Kerlix/Coban. She has no issues or complaints today. 10/31; Patient presents for follow-up. Epicord was placed to the wound beds last clinic visit. The original wound is almost healed. We have done this under Kerlix/Coban. She denies signs of infection. 11/10; patient presents for follow-up. Patient's last epi cord was placed in standard fashion at last clinic visit. The original wound has healed. She still has [name] heel wound. Grafix was approved however too costly for the patient. We will rerun epi cord to see if she can have more applications. She had done very well with this.  We have been using Hydrofera Blue to the heel under Kerlix/Coban. 11/21; patient presents for follow-up. We have been using Hydrofera Blue and Santyl to the heel under Kerlix/Coban. She switched to [name] new healthcare insurance and again The skin substitute is too costly for the patient. She denies signs of infection. 11/28; patient presents for follow-up. Her facility did not change the wrap last clinic visit due to lack of staffing. The wrap was taken off and Hydrofera Blue dressing has been in place for the past week. She has no issues or complaints today. 12/5; patient presents for follow-up. She states she has had more drainage over the past week. They did not change the wrap at her facility. She states the nurse will be back this week. She is also been doing [name] lot of physical therapy. She has been using the Prevalon boot while in bed. 12/12; patient presents for follow-up. We have been using Hydrofera Blue with antibiotic ointment under 4-layer compression. She is tolerated the wrap well. She states she is using her Prevalon boot while in the wheelchair. She has no issues or complaints today. There is been improvement in wound healing. 12/19; patient presents for follow-up. We have been using Hydrofera Blue and antibiotic ointment to the wound bed under 4-layer compression. Her facility change the wrap once. She states she has been using the Prevalon boot in bed but not in the wheelchair due to issues with transferring. Overall there is still improvement in wound healing. 1/16; patient presents for follow-up. She missed her last clinic appointment. We have been using Hydrofera Blue and antibiotic ointment under 4-layer compression. At the facility this has only been changed twice over the past 3 weeks. They are not using 4-layer compression. She came in with Kerlix/Coban. 1/23; patient presents for follow-up. We have been using Santyl and Hydrofera Blue under 4-layer compression. Patient has no  issues or complaints today. 2/1; patient presents for follow-up. Patient's been using Santyl and Hydrofera Blue to the wound bed. She has developed more slough and now [name] mild odor. She states she is  wearing the Prevalon boot at night. It is unclear if she is offloading this area during the day. 2/13; patient presents for follow-up. Patient's been using Dakin's wet-to-dry dressings. She received her Keystone antibiotic ointment in the mail. She has not used this yet. She reports using her Prevalon boot. She has no issues or complaints today. 2/20; patient presents for follow-up. She has been using Keystone antibiotic ointment with Hydrofera Blue. She has no issues or complaints today. Due to transportation she cannot do the total contact cast today. She is scheduled for next week to have this placed. 2/27; patient presents for follow-up. Plan is for the total contact cast today. We have been using Keystone and Hydrofera Blue to the wound bed. She forgot her Keystone antibiotic ointment. She has no issues or complaints today. 2/29; patient presents for follow-up. Patient presents for her obligatory cast change. She has no issues or complaints today. 3/5; patient presents for follow-up. She has had the cast in place for the past week and has tolerated this well. She has no issues or complaints today. We have been using PolyMem with Keystone antibiotic ointment. 12/15/2022: The wound is quite clean and measured smaller today. 3/19; we have been using Keystone antibiotic ointment with PolyMem silver under the total contact cast. Overall there is been improvement in wound healing. [name], [name] ([id number]) [id number] Page 4 of 11 3/26; patient presents for follow-up. She is been approved for epi fix. She would like to proceed with this today. Previously we have been using Keystone antibiotic ointment with PolyMem silver under the total contact cast. She has no issues or  complaints. 01/05/2023: The wound is clean and she is tolerating total contact casting without difficulty. She had her first application of EpiFix last week. 4/9; patient presents for follow-up. We have been applying EpiFix under the total contact cast for the past 2 weeks. She is tolerated this well. She has no issues or complaints today. 4/16; patient presents for follow-up. We have been using EpiFix under the total contact cast. She has done well with this. Wound is smaller. She does have slough buildup. 4/23; patient presents for follow-up. We have been using EpiFix under the total contact cast. Wound appears well-healing. She does have slough buildup. 4/30; patient presents for follow-up. We have been using EpiFix under the total contact cast. Wound is smaller. She again has slough buildup. 5/6; patient with [name] left heel ulcer type II diabetic. We applied EpiFix #7 under [name] total contact cast. Her wound is making good progress. 5/14; patient presents for follow-up. We have been using EpiFix under the total contact casting wound is smaller. 5/21; patient presents for follow-up. We have been using EpiFix under the total contact cast. The wound is smaller. She has no issues or complaints today. 5/28; patient presents for follow-up. We have been using EpiFix under the total contact cast. Wound is smaller. 6/11; patient presents for follow-up. We have been using EpiFix under the total contact cast. Wound is stable. Electronic Signature(s) Signed: 03/16/2023 3:34:20 PM By: [name] Corwin DO Entered By: [name] on 03/16/2023 09:27:27 -------------------------------------------------------------------------------- Physical Exam Details Patient Name: Date of Service: [name], [name]. 03/16/2023 8:00 [name] Medical Record Number: [id number] Patient Account Number: [id number] Date of Birth/Sex: Treating RN: 03/16/1961 (62 y.o. F) Primary Care Provider: Junious [name] Other Clinician: Referring  Provider: Treating Provider/Extender: [name] in Treatment: 53 Constitutional respirations regular, non-labored and within target range for patient.. Cardiovascular 2+ dorsalis  pedis/posterior tibialis pulses. Psychiatric pleasant and cooperative. Notes Open wound with granulation tissue and nonviable tissue at the heel. No signs of surrounding infection. Electronic Signature(s) Signed: 03/16/2023 3:34:20 PM By: [name] Corwin DO Entered By: [name] on 03/16/2023 09:30:00 -------------------------------------------------------------------------------- Physician Orders Details Patient Name: Date of Service: [name], [name]. 03/16/2023 8:00 [name] Medical Record Number: [id number] Patient Account Number: [id number] Date of Birth/Sex: Treating RN: Aug 18, 1961 (61 y.o. [name] Primary Care Provider: Junious [name] Other Clinician: Referring Provider: Treating Provider/Extender: [name] in Treatment: 48 Verbal / Phone Orders: No Diagnosis Coding ICD-10 Coding Code Description [phone] Non-pressure chronic ulcer of other part of left foot with fat layer exposed E11.621 Type 2 diabetes mellitus with foot ulcer Koudelka, Adoria [name] ([id number]) [id number] Page 5 of 11 E66.01 Morbid (severe) obesity due to excess calories Follow-up Appointments ppointment in 1 week. - Dr. Mikey Bussing 1230 03/22/2023 Return [name] ppointment in 2 weeks. - Dr. Mikey Bussing 215pm 03/30/2023 Return [name] Other: - Leave dressing in place. Facility not to change dressing. Anesthetic (In clinic) Topical Lidocaine 5% applied to wound bed (In clinic) Topical Lidocaine 4% applied to wound bed Cellular or Tissue Based Products Cellular or Tissue Based Product Type: - Run IVR for EpiFix and Epicord=100% covered 05/07/22: Epicord ordered 12/08/2022: Run insurance for epicord and theraskin. 01/19/2023: Run for organogenesis-  puraply AM daptic or Mepitel. (DO NOT REMOVE). - Cellular or Tissue Based Product applied to wound bed, secured with steri-strips, cover with [name] Epicord #1 05/15/25 Epicord #2 05/22/22 Epicord #3 06/29/2022 Epicord #4 07/07/2022 Epifix #5 07/14/22 Epicord #6 07/20/22 Epicord # 7 07/27/22 ***Run for Grafix***PENDING Epicord #8 08/04/2022 2024-IVR epifix approved 12/29/2022 #1 epifix applied to left heel. 01/05/2023 #2 epifix applied to left heel. 01/12/2023 #3 epifix applied to left heel 01/19/2023 #4 epifix applied to left heel. 01/26/23 Epifix #5 02/08/2023 #7 epifix 02/16/2023 #8 epifix 02/23/2023 #9 epifix 03/02/23 #10 epifix 03/09/2023 #11 epifix 18mm disk 03/16/2023 #12 epifix 18mm Disk Bathing/ Shower/ Hygiene May shower with protection but do not get wound dressing(s) wet. Protect dressing(s) with water repellant cover (for example, large plastic bag) or [name] cast cover and may then take shower. Edema Control - Lymphedema / SCD / Other Elevate legs to the level of the heart or above for 30 minutes daily and/or when sitting for 3-4 times [name] day throughout the day. Avoid standing for long periods of time. Off-Loading Total Contact Cast to Left Lower Extremity - size 4 Additional Orders / Instructions Follow Nutritious Diet - Monitor/Control Blood Sugar Wound Treatment Wound #2 - Calcaneus Wound Laterality: Left, Distal Cleanser: Soap and Water 1 x Per Week/30 Days Discharge Instructions: May shower and wash wound with dial antibacterial soap and water prior to dressing change. Cleanser: Wound Cleanser 1 x Per Week/30 Days Discharge Instructions: Cleanse the wound with wound cleanser prior to applying [name] clean dressing using gauze sponges, not tissue or cotton balls. Prim Dressing: epifix 1 x Per Week/30 Days ary Discharge Instructions: applied by provider Prim Dressing: adaptic and steri-strips 1 x Per Week/30 Days ary Discharge Instructions: secured the epifix Secondary Dressing: ABD Pad,  8x10 1 x Per Week/30 Days Discharge Instructions: Apply over primary dressing as directed. Secondary Dressing: Woven Gauze Sponge, Non-Sterile 4x4 in (Home Health) 1 x Per Week/30 Days Discharge Instructions: Apply over primary dressing as directed. Secured With: [employer], 4.5x3.1 (in/yd) 1 x Per Week/30 Days Discharge Instructions: Secure with Kerlix as directed. Add-Ons: TCC size 4  1 x Per Week/30 Days Discharge Instructions: last layer applied by physician. Dehn, Caelin [name] ([id number]) [id number] Page 6 of 11 Electronic Signature(s) Signed: 03/16/2023 3:34:20 PM By: [name] Corwin DO Previous Signature: 03/16/2023 9:14:28 AM Version By: [name] Stall RN, BSN Entered By: [name] on 03/16/2023 09:30:10 -------------------------------------------------------------------------------- Problem List Details Patient Name: Date of Service: [name], [name]. 03/16/2023 8:00 [name] Medical Record Number: [id number] Patient Account Number: [id number] Date of Birth/Sex: Treating RN: [birth year] (61 y.o. [name], [name] Primary Care Provider: Junious [name] Other Clinician: Referring Provider: Treating Provider/Extender: [name] in Treatment: 78 Active Problems ICD-10 Encounter Code Description Active Date MDM Diagnosis [phone] Non-pressure chronic ulcer of other part of left foot with fat layer exposed 03/05/2022 No Yes E11.621 Type 2 diabetes mellitus with foot ulcer 03/05/2022 No Yes E66.01 Morbid (severe) obesity due to excess calories 03/05/2022 No Yes Inactive Problems Resolved Problems Electronic Signature(s) Signed: 03/16/2023 3:34:20 PM By: [name] Corwin DO Previous Signature: 03/16/2023 9:14:28 AM Version By: [name] Stall RN, BSN Entered By: [name] on 03/16/2023 09:26:39 -------------------------------------------------------------------------------- Progress Note Details Patient Name: Date of  Service: [name], [name]. 03/16/2023 8:00 [name] Medical Record Number: [id number] Patient Account Number: [id number] Date of Birth/Sex: Treating RN: Mar 07, 1961 (61 y.o. F) Primary Care Provider: Junious [name] Other Clinician: Referring Provider: Treating Provider/Extender: [name] in Treatment: 96 Subjective Chief Complaint Information obtained from Patient 03/05/2022; left foot wound History of Present Illness (HPI) Admission 03/05/2022 Ms. Shelise Panzer is [name] 62 year old female with [name] past medical history of uncontrolled insulin-dependent type 2 diabetes, tobacco user and chronic diastolic heart failure that presents to the clinic for [name] 51-month history of wound to her left heel. She states she had [name] left ankle fusion in September 2022. She states that she always had [name] wound after the surgery and it never healed. She has home health and they have been doing compression wraps along with silver alginate to the wound bed. She currently denies signs of infection. 6/8; this is [name] 62 year old woman with type 2 diabetes. She developed [name] wound on her left Achilles heel just above the tip of the heel in the setting of recurrent ankle surgeries in late 2022. I have seen some of these results from either the cast or the surgical boots that are put on after these operations. She thinks this may be the case. And [name] sense of pressure ulcer. In any case that she has been using Santyl Hydrofera Blue under compression. She is not wearing any footwear at home as she cannot find anything to accommodate the wrap 6/22; patient presents for follow-up. She has home health that comes out once [name] week to help with dressing changes. She has no issues or complaints today. [name], [name] ([id number]) [id number] Page 7 of 11 We have been using Hydrofera Blue under compression therapy. 7/6; patient presents for follow-up. She states that home health did not come out  for the past 2 weeks. It is unclear why. We have been using Hydrofera Blue and Santyl under compression therapy. 7/13; patient presents for follow-up. She did not take the oral antibiotics prescribed at last clinic visit. We have been using Hydrofera Blue with gentamicin/mupirocin ointment under compression therapy. She has no issues or complaints today. She denies signs of infection. 7/20; patient presents for follow-up. We have been using Hydrofera Blue with antibiotic ointment under 3 layer compression. She has home health that change the dressing once. They  put collagen on the wound bed. She also reports falling yesterday and hitting her right foot. She has no pain to the area today. 7/27; patient presents for follow-up. We have been using Hydrofera Blue under 3 layer compression. She has home health that changes the dressing. She has no issues or complaints today. 8/3; patient presents for follow-up. We continues to use Hydrofera Blue under 3 layer compression. She has no issues or complaints today. She has been approved for Epicord at 100%. 8/11; patient presents for follow-up. We have been using Hydrofera Blue under 3 layer compression. She has been approved for Epicord and we have this today. She is in agreement with having this applied. 8/18; patient presents for follow-up. Epicord #1 was placed in standard fashion last clinic visit. She has no issues or complaints today. 9/18; Unfortunately patient has missed her last clinic appointments due to falling and breaking her right ankle. We have been following her for her left ankle wound. She has also developed [name] large blister to the left heel over the past week that has ruptured and dried. She currently resides In [facility]. Wound9/25; patient presents for follow-up. We have been using Hydrofera Blue and Santyl to the original left ankle wound and Xeroform to the dried blistered. We have been wrapping her with compression therapy. She  resides in [name] facility and they changed the wrap once this past week. She has no issues or complaints today. She denies signs of infection. 10/3; patient presents for follow-up. We have been using Xeroform to the heel wound and Epicort to the left ankle wound All under compression therapy. She has no issues or complaints today. 10/10; patient presents for follow-up. We have been using Epicort to the left ankle wound and Santyl and Hydrofera Blue to the heel wound. All under compression therapy. she has no issues or complaints today. 10/16; patient presents for follow-up. Epicort was placed in standard fashion to the superior wound and onto the heel and Santyl and Hydrofera Blue. She states that the wrap got wet during her shower and her facility was able to rewrap with Kerlix/Coban. 10/23; patient presents for follow-up. Epicord was placed to the wound beds last clinic visit. We continue Kerlix/Coban. She has no issues or complaints today. 10/31; Patient presents for follow-up. Epicord was placed to the wound beds last clinic visit. The original wound is almost healed. We have done this under Kerlix/Coban. She denies signs of infection. 11/10; patient presents for follow-up. Patient's last epi cord was placed in standard fashion at last clinic visit. The original wound has healed. She still has [name] heel wound. Grafix was approved however too costly for the patient. We will rerun epi cord to see if she can have more applications. She had done very well with this. We have been using Hydrofera Blue to the heel under Kerlix/Coban. 11/21; patient presents for follow-up. We have been using Hydrofera Blue and Santyl to the heel under Kerlix/Coban. She switched to [name] new healthcare insurance and again The skin substitute is too costly for the patient. She denies signs of infection. 11/28; patient presents for follow-up. Her facility did not change the wrap last clinic visit due to lack of staffing. The wrap was  taken off and Hydrofera Blue dressing has been in place for the past week. She has no issues or complaints today. 12/5; patient presents for follow-up. She states she has had more drainage over the past week. They did not change the wrap at her facility.  She states the nurse will be back this week. She is also been doing [name] lot of physical therapy. She has been using the Prevalon boot while in bed. 12/12; patient presents for follow-up. We have been using Hydrofera Blue with antibiotic ointment under 4-layer compression. She is tolerated the wrap well. She states she is using her Prevalon boot while in the wheelchair. She has no issues or complaints today. There is been improvement in wound healing. 12/19; patient presents for follow-up. We have been using Hydrofera Blue and antibiotic ointment to the wound bed under 4-layer compression. Her facility change the wrap once. She states she has been using the Prevalon boot in bed but not in the wheelchair due to issues with transferring. Overall there is still improvement in wound healing. 1/16; patient presents for follow-up. She missed her last clinic appointment. We have been using Hydrofera Blue and antibiotic ointment under 4-layer compression. At the facility this has only been changed twice over the past 3 weeks. They are not using 4-layer compression. She came in with Kerlix/Coban. 1/23; patient presents for follow-up. We have been using Santyl and Hydrofera Blue under 4-layer compression. Patient has no issues or complaints today. 2/1; patient presents for follow-up. Patient's been using Santyl and Hydrofera Blue to the wound bed. She has developed more slough and now [name] mild odor. She states she is wearing the Prevalon boot at night. It is unclear if she is offloading this area during the day. 2/13; patient presents for follow-up. Patient's been using Dakin's wet-to-dry dressings. She received her Keystone antibiotic ointment in the mail. She has  not used this yet. She reports using her Prevalon boot. She has no issues or complaints today. 2/20; patient presents for follow-up. She has been using Keystone antibiotic ointment with Hydrofera Blue. She has no issues or complaints today. Due to transportation she cannot do the total contact cast today. She is scheduled for next week to have this placed. 2/27; patient presents for follow-up. Plan is for the total contact cast today. We have been using Keystone and Hydrofera Blue to the wound bed. She forgot her Keystone antibiotic ointment. She has no issues or complaints today. 2/29; patient presents for follow-up. Patient presents for her obligatory cast change. She has no issues or complaints today. 3/5; patient presents for follow-up. She has had the cast in place for the past week and has tolerated this well. She has no issues or complaints today. We have been using PolyMem with Keystone antibiotic ointment. 12/15/2022: The wound is quite clean and measured smaller today. 3/19; we have been using Keystone antibiotic ointment with PolyMem silver under the total contact cast. Overall there is been improvement in wound healing. 3/26; patient presents for follow-up. She is been approved for epi fix. She would like to proceed with this today. Previously we have been using Keystone antibiotic ointment with PolyMem silver under the total contact cast. She has no issues or complaints. [name], [name] [name] ([id number]) [id number] Page 8 of 11 01/05/2023: The wound is clean and she is tolerating total contact casting without difficulty. She had her first application of EpiFix last week. 4/9; patient presents for follow-up. We have been applying EpiFix under the total contact cast for the past 2 weeks. She is tolerated this well. She has no issues or complaints today. 4/16; patient presents for follow-up. We have been using EpiFix under the total contact cast. She has done well with  this. Wound is smaller. She does have slough buildup.  4/23; patient presents for follow-up. We have been using EpiFix under the total contact cast. Wound appears well-healing. She does have slough buildup. 4/30; patient presents for follow-up. We have been using EpiFix under the total contact cast. Wound is smaller. She again has slough buildup. 5/6; patient with [name] left heel ulcer type II diabetic. We applied EpiFix #7 under [name] total contact cast. Her wound is making good progress. 5/14; patient presents for follow-up. We have been using EpiFix under the total contact casting wound is smaller. 5/21; patient presents for follow-up. We have been using EpiFix under the total contact cast. The wound is smaller. She has no issues or complaints today. 5/28; patient presents for follow-up. We have been using EpiFix under the total contact cast. Wound is smaller. 6/11; patient presents for follow-up. We have been using EpiFix under the total contact cast. Wound is stable. Patient History Information obtained from Patient, Chart. Family History Cancer - Father,Mother, Diabetes - Mother,Maternal Grandparents,Paternal Grandparents, Heart Disease - Siblings,Maternal Grandparents, Hypertension - Siblings, Tuberculosis - Maternal Grandparents,Paternal Grandparents. Social History Current every day smoker, Marital Status - Single, Alcohol Use - Never, Drug Use - No History, Caffeine Use - Daily. 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 Vitals Time Taken: 8:18 AM, Height: 66 in, Weight: 339 lbs, BMI: 54.7, Temperature: 98.3 F, Pulse: 88 bpm, Respiratory Rate: 16 breaths/min, Blood Pressure: 144/77 mmHg, Capillary Blood Glucose: 179 mg/dl. Cardiovascular 2+ dorsalis pedis/posterior tibialis pulses. Psychiatric pleasant and cooperative. General Notes: Open wound with granulation tissue and nonviable tissue at the heel. No signs of surrounding infection. Integumentary (Hair, Skin) Wound #2 status is Open. Original cause of wound was Blister. The date acquired was: 06/15/2022. The wound has been in treatment 37 weeks. The wound is located on the Left,Distal Calcaneus. The wound measures 1cm length x 1.2cm width x 0.1cm depth; 0.942cm^2 area and 0.094cm^3 volume. There is Fat Layer (Subcutaneous Tissue) exposed. There is no tunneling or undermining noted. There is [name] medium amount of serosanguineous drainage noted. The wound margin is distinct with the outline attached to the wound base. There is large (67-100%) red, pink granulation within the wound bed. There is [name] small (1-33%) amount of necrotic tissue within the wound bed including Adherent Slough. The periwound skin appearance had no abnormalities noted for texture. The periwound skin appearance had no abnormalities noted for color. The periwound skin appearance did not exhibit: Dry/Scaly, Maceration. Periwound temperature was noted as No Abnormality. Labell, Zarayah [name] ([id number]) [id number] Page 9 of 11 Assessment Active Problems ICD-10 Non-pressure chronic ulcer of other part of left foot with fat layer exposed Type 2 diabetes mellitus with foot ulcer Morbid (severe) obesity due to excess calories Patient's wound is stable. I debrided nonviable tissue. EpiFix and the total contact cast was  placed in standard fashion. Overall wound still appears well- healing. No signs of infection. Follow-up in 1 week. Procedures Wound #2 Pre-procedure diagnosis of Wound #2 is [name] Diabetic Wound/Ulcer of the Lower Extremity located on the Left,Distal Calcaneus .Severity of Tissue Pre  Debridement is: Fat layer exposed. There was [name] Excisional Skin/Subcutaneous Tissue Debridement with [name] total area of 0.94 sq cm performed by [name] Corwin, DO. With the following instrument(s): Curette to remove Viable and Non-Viable tissue/material. Material removed includes Callus, Subcutaneous Tissue, Slough, Skin: Dermis, and Skin: Epidermis after achieving pain control using Lidocaine 4% Topical Solution. [name] time out was conducted at 08:35, prior to the start of the procedure. [name] Minimum amount of bleeding was controlled with Pressure. The procedure was tolerated well with [name] pain level of 0 throughout and [name] pain level of 0 following the procedure. Post Debridement Measurements: 1cm length x 1.2cm width x 0.1cm depth; 0.094cm^3 volume. Character of Wound/Ulcer Post Debridement is improved. Severity of Tissue Post Debridement is: Fat layer exposed. Post procedure Diagnosis Wound #2: Same as Pre-Procedure Pre-procedure diagnosis of Wound #2 is [name] Diabetic Wound/Ulcer of the Lower Extremity located on the Left,Distal Calcaneus. [name] skin graft procedure using [name] bioengineered skin substitute/cellular or tissue based product was performed by [name] Corwin, DO with the following instrument(s): Forceps and Scissors. Epifix was applied and secured with Steri-Strips and adaptic. 2 sq cm of product was utilized and 0 sq cm was wasted. Post Application, no dressing was applied. [name] Time Out was conducted at 08:41, prior to the start of the procedure. The procedure was tolerated well with [name] pain level of 0 throughout and [name] pain level of 0 following the procedure. Post procedure Diagnosis Wound #2: Same as  Pre-Procedure . Plan Follow-up Appointments: Return Appointment in 1 week. - Dr. Mikey Bussing 1230 03/22/2023 Return Appointment in 2 weeks. - Dr. Mikey Bussing 215pm 03/30/2023 Other: - Leave dressing in place. Facility not to change dressing. Anesthetic: (In clinic) Topical Lidocaine 5% applied to wound bed (In clinic) Topical Lidocaine 4% applied to wound bed Cellular or Tissue Based Products: Cellular or Tissue Based Product Type: - Run IVR for EpiFix and Epicord=100% covered 05/07/22: Epicord ordered 12/08/2022: Run insurance for epicord and theraskin. 01/19/2023: Run for organogenesis- puraply AM Cellular or Tissue Based Product applied to wound bed, secured with steri-strips, cover with Adaptic or Mepitel. (DO NOT REMOVE). - Epicord #1 05/15/25 Epicord #2 05/22/22 Epicord #3 06/29/2022 Epicord #4 07/07/2022 Epifix #5 07/14/22 Epicord #6 07/20/22 Epicord # 7 07/27/22 ***Run for Grafix***PENDING Epicord #8 08/04/2022 2024-IVR epifix approved 12/29/2022 #1 epifix applied to left heel. 01/05/2023 #2 epifix applied to left heel. 01/12/2023 #3 epifix applied to left heel 01/19/2023 #4 epifix applied to left heel. 01/26/23 Epifix #5 02/08/2023 #7 epifix 02/16/2023 #8 epifix 02/23/2023 #9 epifix 03/02/23 #10 epifix 03/09/2023 #11 epifix 18mm disk 03/16/2023 #12 epifix 18mm Disk Bathing/ Shower/ Hygiene: May shower with protection but do not get wound dressing(s) wet. Protect dressing(s) with water repellant cover (for example, large plastic bag) or [name] cast cover and may then take shower. Edema Control - Lymphedema / SCD / Other: Elevate legs to the level of the heart or above for 30 minutes daily and/or when sitting for 3-4 times [name] day throughout the day. Avoid standing for long periods of time. Off-Loading: T Contact Cast to Left Lower Extremity - size 4 otal Additional Orders / Instructions: Follow Nutritious Diet - Monitor/Control Blood Sugar WOUND #2: - Calcaneus Wound Laterality: Left, Distal Cleanser: Soap and Water 1  x Per Week/30 Days Discharge Instructions: May shower and wash wound with dial antibacterial soap and water prior to dressing change. Cleanser: Wound Cleanser 1 x Per Week/30 Days Discharge Instructions: Cleanse the wound with wound cleanser prior to applying [name]  clean dressing using gauze sponges, not tissue or cotton balls. Prim Dressing: epifix 1 x Per Week/30 Days ary Discharge Instructions: applied by provider Prim Dressing: adaptic and steri-strips 1 x Per Week/30 Days ary Discharge Instructions: secured the epifix Secondary Dressing: ABD Pad, 8x10 1 x Per Week/30 Days Discharge Instructions: Apply over primary dressing as directed. Secondary Dressing: Woven Gauze Sponge, Non-Sterile 4x4 in (Home Health) 1 x Per Week/30 Days Discharge Instructions: Apply over primary dressing as directed. Secured With: [employer], 4.5x3.1 (in/yd) 1 x Per Week/30 Days Discharge Instructions: Secure with Kerlix as directed. Add-Ons: TCC size 4 1 x Per Week/30 Days Hinojosa, Kylinn [name] ([id number]) [id number] Page 10 of 11 Discharge Instructions: last layer applied by physician. 1. In office sharp debridement 2. EpiFix placed in standard fashion 2. T contact cast placed in standard fashion otal 3. In office sharp debridement 4. Follow-up in 1 week Electronic Signature(s) Signed: 03/16/2023 3:34:20 PM By: [name] Corwin DO Entered By: [name] on 03/16/2023 09:37:33 -------------------------------------------------------------------------------- HxROS Details Patient Name: Date of Service: [name], [name]. 03/16/2023 8:00 [name] Medical Record Number: [id number] Patient Account Number: [id number] Date of Birth/Sex: Treating RN: 26-Apr-1961 (61 y.o. F) Primary Care Provider: Junious [name] Other Clinician: Referring Provider: Treating Provider/Extender: [name] in Treatment: 53 Information Obtained From Patient  Chart Hematologic/Lymphatic Medical History: Positive for: Anemia Respiratory Medical History: Positive for: Chronic Obstructive Pulmonary Disease (COPD) Cardiovascular Medical History: Positive for: Congestive Heart Failure - Weighs self daily; Hypertension; Myocardial Infarction; Peripheral Venous Disease Gastrointestinal Medical History: Past Medical History Notes: GERD, Peptic Ulcer Disease Endocrine Medical History: Positive for: Type II Diabetes Time with diabetes: 16 years Treated with: Insulin, Oral agents Blood sugar tested every day: Yes Tested : Twice daily Musculoskeletal Medical History: Past Medical History Notes: Broke left leg 3 years ago, 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, foot fused Neurologic Medical History: Positive for: Neuropathy Psychiatric Medical History: Past Medical History Notes: Depression Immunizations Devries, Dylin [name] ([id number]) [id number] Page 11 of 11 Pneumococcal Vaccine: Received Pneumococcal Vaccination: No Implantable Devices None Family and Social History Cancer: Yes - Father,Mother; Diabetes: Yes - Mother,Maternal Grandparents,Paternal Grandparents; Heart Disease: Yes - Siblings,Maternal Grandparents; Hypertension: Yes - Siblings; Tuberculosis: Yes - Maternal Grandparents,Paternal Grandparents; Current every day smoker; Marital Status - Single; Alcohol Use: Never; Drug Use: No History; Caffeine Use: Daily; Financial Concerns: No; Food, Clothing or Shelter Needs: No; Support System Lacking: No; Transportation Concerns: No Electronic Signature(s) Signed: 03/16/2023 3:34:20 PM By: [name] Corwin DO Entered By: [name] on 03/16/2023 09:27:35 -------------------------------------------------------------------------------- SuperBill Details Patient Name: Date of Service: [name]. 03/16/2023 Medical Record Number: [id number] Patient Account Number: [id number] Date of Birth/Sex: Treating  RN: 05/02/1961 (61 y.o. [name] Primary Care Provider: Junious [name] Other Clinician: Referring Provider: Treating Provider/Extender: [name] in Treatment: 53 Diagnosis Coding ICD-10 Codes Code Description [phone] Non-pressure chronic ulcer of other part of left foot with fat layer exposed E11.621 Type 2 diabetes mellitus with foot ulcer E66.01 Morbid (severe) obesity due to excess calories Facility Procedures : CPT4 Code: 41324401 Description: Q4186 Epifix 18mm disk qty 3 Modifier: Quantity: 3 : CPT4 Code: [id number] 1 Description: 5275 - SKIN SUB GRAFT FACE/NK/HF/G ICD-10 Diagnosis Description L97.522 Non-pressure chronic ulcer of other part of left foot with fat layer exposed E11.621 Type 2 diabetes mellitus with foot ulcer Modifier: Quantity: 1 Physician Procedures : CPT4 Code Description Modifier [id number] 15275 - WC PHYS SKIN SUB GRAFT FACE/NK/HF/G ICD-10 Diagnosis Description  Z61.096 Non-pressure chronic ulcer of other part of left foot with fat layer exposed E11.621 Type 2 diabetes mellitus with foot ulcer Quantity: 1 Electronic Signature(s) Signed: 03/16/2023 3:34:20 PM By: [name] Corwin DO Previous Signature: 03/16/2023 9:14:28 AM Version By: [name] Stall RN, BSN Entered By: [name] on 03/16/2023 09:38:06

## 2023-03-22 ENCOUNTER — Encounter (HOSPITAL_BASED_OUTPATIENT_CLINIC_OR_DEPARTMENT_OTHER): Payer: 59 | Admitting: Internal Medicine

## 2023-03-22 DIAGNOSIS — L97522 Non-pressure chronic ulcer of other part of left foot with fat layer exposed: Secondary | ICD-10-CM | POA: Diagnosis not present

## 2023-03-22 DIAGNOSIS — E11621 Type 2 diabetes mellitus with foot ulcer: Secondary | ICD-10-CM | POA: Diagnosis not present

## 2023-03-22 NOTE — Progress Notes (Signed)
Empson, Heatherly [name] ([id number]) [id number] Page 1 of 12 Visit Report for 03/22/2023 Chief Complaint Document Details Patient Name: Date of Service: [name]. 03/22/2023 12:30 PM Medical Record Number: [id number] Patient Account Number: [id number] Date of Birth/Sex: Treating RN: [birth year] (62 y.o. F) Primary Care Provider: [name] Other Clinician: Referring Provider: Treating Provider/Extender: [name] in Treatment: 46 Information Obtained from: Patient Chief Complaint 03/05/2022; left foot wound Electronic Signature(s) Signed: 03/22/2023 4:37:01 PM By: Geralyn Corwin DO Entered By: Geralyn Corwin on 03/22/2023 13:17:28 -------------------------------------------------------------------------------- Cellular or Tissue Based Product Details Patient Name: Date of Service: [name]. 03/22/2023 12:30 PM Medical Record Number: [id number] Patient Account Number: [id number] Date of Birth/Sex: Treating RN: 1961-01-12 (61 y.o. [name], [name] Primary Care Provider: [name] Other Clinician: Referring Provider: Treating Provider/Extender: [name]4 Cellular or Tissue Based Product Type Wound #2 Left,Distal Calcaneus Applied to: Performed By: Physician Geralyn Corwin, DO Cellular or Tissue Based Product Type: Epifix Level of Consciousness (Pre-procedure): Awake and Alert Pre-procedure Verification/Time Out Yes - 13:05 Taken: Location: genitalia / hands / feet / multiple digits Wound Size (sq cm): 0.9 Product Size (sq cm): 2 Waste Size (sq cm): 0 Amount of Product Applied (sq cm): 2 Instrument Used: Forceps, Scissors Lot #: [id number] Order #: 13 Expiration Date: 10/06/2027 Fenestrated: No Reconstituted: Yes Solution Type: Normal saline Solution Amount: 3 ml Lot #: [id number] Solution Expiration Date: 03/04/2025 Secured: Yes Secured  With: Steri-Strips, adaptic Dressing Applied: No Procedural Pain: 0 Post Procedural Pain: 0 Response to Treatment: Procedure was tolerated well Level of Consciousness (Post- Awake and Alert procedure): [name] ([id number]) [id number] Page 2 of 12 Post Procedure Diagnosis Same as Pre-procedure Electronic Signature(s) Signed: 03/22/2023 4:37:01 PM By: Geralyn Corwin DO Signed: 03/22/2023 5:39:10 PM By: Shawn Stall RN, BSN Entered By: Shawn Stall on 03/22/2023 13:06:52 -------------------------------------------------------------------------------- Debridement Details Patient Name: Date of Service: [name]. 03/22/2023 12:30 PM Medical Record Number: [id number] Patient Account Number: [id number] Date of Birth/Sex: Treating RN: 1961-07-28 (61 y.o. [name], [name] Primary Care Provider: [name] Other Clinician: Referring Provider: Treating Provider/Extender: [name]4 Debridement Performed for Assessment: Wound #2 Left,Distal Calcaneus Performed By: Physician Geralyn Corwin, DO Debridement Type: Debridement Severity of Tissue Pre Debridement: Fat layer exposed Level of Consciousness (Pre-procedure): Awake and Alert Pre-procedure Verification/Time Out Yes - 13:00 Taken: Start Time: 13:01 Pain Control: Lidocaine 4% T opical Solution Percent of Wound Bed Debrided: 100% T Area Debrided (cm): otal 0.71 Tissue and other material debrided: Viable, Non-Viable, Callus, Slough, Subcutaneous, Skin: Dermis , Skin: Epidermis, Slough Level: Skin/Subcutaneous Tissue Debridement Description: Excisional Instrument: Curette Bleeding: Minimum Hemostasis Achieved: Pressure End Time: 13:04 Procedural Pain: 0 Post Procedural Pain: 0 Response to Treatment: Procedure was tolerated well Level of Consciousness (Post- Awake and Alert procedure): Post Debridement Measurements of Total  Wound Length: (cm) 0.9 Width: (cm) 1 Depth: (cm) 0.1 Volume: (cm) 0.071 Character of Wound/Ulcer Post Debridement: Improved Severity of Tissue Post Debridement: Fat layer exposed Post Procedure Diagnosis Same as Pre-procedure Electronic Signature(s) Signed: 03/22/2023 4:37:01 PM By: Geralyn Corwin DO Signed: 03/22/2023 5:39:10 PM By: Shawn Stall RN, BSN Entered By: Shawn Stall on 03/22/2023 13:05:30 HPI Details -------------------------------------------------------------------------------- [name] ([id number]) [id number] Page 3 of 12 Patient Name: Date of Service: [name]. 03/22/2023 12:30 PM Medical Record Number: [id number] Patient Account Number: [id number] Date of Birth/Sex: Treating RN: 02/12/1961 (61  y.o. F) Primary Care Provider: [name] Other Clinician: Referring Provider: Treating Provider/Extender: [name] in Treatment: 78 History of Present Illness HPI Description: Admission 03/05/2022 [name] is a 62 year old female with a past medical history of uncontrolled insulin-dependent type 2 diabetes, tobacco user and chronic diastolic heart failure that presents to the clinic for a 54-month history of wound to her left heel. She states she had a left ankle fusion in September 2022. She states that she always had a wound after the surgery and it never healed. She has home health and they have been doing compression wraps along with silver alginate to the wound bed. She currently denies signs of infection. 6/8; this is a 62 year old woman with type 2 diabetes. She developed a wound on her left Achilles heel just above the tip of the heel in the setting of recurrent ankle surgeries in late 2022. I have seen some of these results from either the cast or the surgical boots that are put on after these operations. She thinks this may be the case. And a sense of pressure ulcer. In any case  that she has been using Santyl Hydrofera Blue under compression. She is not wearing any footwear at home as she cannot find anything to accommodate the wrap 6/22; patient presents for follow-up. She has home health that comes out once a week to help with dressing changes. She has no issues or complaints today. We have been using Hydrofera Blue under compression therapy. 7/6; patient presents for follow-up. She states that home health did not come out for the past 2 weeks. It is unclear why. We have been using Hydrofera Blue and Santyl under compression therapy. 7/13; patient presents for follow-up. She did not take the oral antibiotics prescribed at last clinic visit. We have been using Hydrofera Blue with gentamicin/mupirocin ointment under compression therapy. She has no issues or complaints today. She denies signs of infection. 7/20; patient presents for follow-up. We have been using Hydrofera Blue with antibiotic ointment under 3 layer compression. She has home health that change the dressing once. They put collagen on the wound bed. She also reports falling yesterday and hitting her right foot. She has no pain to the area today. 7/27; patient presents for follow-up. We have been using Hydrofera Blue under 3 layer compression. She has home health that changes the dressing. She has no issues or complaints today. 8/3; patient presents for follow-up. We continues to use Hydrofera Blue under 3 layer compression. She has no issues or complaints today. She has been approved for Epicord at 100%. 8/11; patient presents for follow-up. We have been using Hydrofera Blue under 3 layer compression. She has been approved for Epicord and we have this today. She is in agreement with having this applied. 8/18; patient presents for follow-up. Epicord #1 was placed in standard fashion last clinic visit. She has no issues or complaints today. 9/18; Unfortunately patient has missed her last clinic appointments due  to falling and breaking her right ankle. We have been following her for her left ankle wound. She has also developed a large blister to the left heel over the past week that has ruptured and dried. She currently resides In [facility]. Wound9/25; patient presents for follow-up. We have been using Hydrofera Blue and Santyl to the original left ankle wound and Xeroform to the dried blistered. We have been wrapping her with compression therapy. She resides in a facility and they changed the wrap once this  past week. She has no issues or complaints today. She denies signs of infection. 10/3; patient presents for follow-up. We have been using Xeroform to the heel wound and Epicort to the left ankle wound All under compression therapy. She has no issues or complaints today. 10/10; patient presents for follow-up. We have been using Epicort to the left ankle wound and Santyl and Hydrofera Blue to the heel wound. All under compression therapy. she has no issues or complaints today. 10/16; patient presents for follow-up. Epicort was placed in standard fashion to the superior wound and onto the heel and Santyl and Hydrofera Blue. She states that the wrap got wet during her shower and her facility was able to rewrap with Kerlix/Coban. 10/23; patient presents for follow-up. Epicord was placed to the wound beds last clinic visit. We continue Kerlix/Coban. She has no issues or complaints today. 10/31; Patient presents for follow-up. Epicord was placed to the wound beds last clinic visit. The original wound is almost healed. We have done this under Kerlix/Coban. She denies signs of infection. 11/10; patient presents for follow-up. Patient's last epi cord was placed in standard fashion at last clinic visit. The original wound has healed. She still has a heel wound. Grafix was approved however too costly for the patient. We will rerun epi cord to see if she can have more applications. She had done very well  with this. We have been using Hydrofera Blue to the heel under Kerlix/Coban. 11/21; patient presents for follow-up. We have been using Hydrofera Blue and Santyl to the heel under Kerlix/Coban. She switched to a new healthcare insurance and again The skin substitute is too costly for the patient. She denies signs of infection. 11/28; patient presents for follow-up. Her facility did not change the wrap last clinic visit due to lack of staffing. The wrap was taken off and Hydrofera Blue dressing has been in place for the past week. She has no issues or complaints today. 12/5; patient presents for follow-up. She states she has had more drainage over the past week. They did not change the wrap at her facility. She states the nurse will be back this week. She is also been doing a lot of physical therapy. She has been using the Prevalon boot while in bed. 12/12; patient presents for follow-up. We have been using Hydrofera Blue with antibiotic ointment under 4-layer compression. She is tolerated the wrap well. She states she is using her Prevalon boot while in the wheelchair. She has no issues or complaints today. There is been improvement in wound healing. 12/19; patient presents for follow-up. We have been using Hydrofera Blue and antibiotic ointment to the wound bed under 4-layer compression. Her facility change the wrap once. She states she has been using the Prevalon boot in bed but not in the wheelchair due to issues with transferring. Overall there is still improvement in wound healing. 1/16; patient presents for follow-up. She missed her last clinic appointment. We have been using Hydrofera Blue and antibiotic ointment under 4-layer compression. At the facility this has only been changed twice over the past 3 weeks. They are not using 4-layer compression. She came in with Kerlix/Coban. 1/23; patient presents for follow-up. We have been using Santyl and Hydrofera Blue under 4-layer compression. Patient  has no issues or complaints today. Kingdon, [name] ([id number]) [id number] Page 4 of 12 2/1; patient presents for follow-up. Patient's been using Santyl and Hydrofera Blue to the wound bed. She has developed more slough and now a  mild odor. She states she is wearing the Prevalon boot at night. It is unclear if she is offloading this area during the day. 2/13; patient presents for follow-up. Patient's been using Dakin's wet-to-dry dressings. She received her Keystone antibiotic ointment in the mail. She has not used this yet. She reports using her Prevalon boot. She has no issues or complaints today. 2/20; patient presents for follow-up. She has been using Keystone antibiotic ointment with Hydrofera Blue. She has no issues or complaints today. Due to transportation she cannot do the total contact cast today. She is scheduled for next week to have this placed. 2/27; patient presents for follow-up. Plan is for the total contact cast today. We have been using Keystone and Hydrofera Blue to the wound bed. She forgot her Keystone antibiotic ointment. She has no issues or complaints today. 2/29; patient presents for follow-up. Patient presents for her obligatory cast change. She has no issues or complaints today. 3/5; patient presents for follow-up. She has had the cast in place for the past week and has tolerated this well. She has no issues or complaints today. We have been using PolyMem with Keystone antibiotic ointment. 12/15/2022: The wound is quite clean and measured smaller today. 3/19; we have been using Keystone antibiotic ointment with PolyMem silver under the total contact cast. Overall there is been improvement in wound healing. 3/26; patient presents for follow-up. She is been approved for epi fix. She would like to proceed with this today. Previously we have been using Keystone antibiotic ointment with PolyMem silver under the total contact cast. She has no issues or  complaints. 01/05/2023: The wound is clean and she is tolerating total contact casting without difficulty. She had her first application of EpiFix last week. 4/9; patient presents for follow-up. We have been applying EpiFix under the total contact cast for the past 2 weeks. She is tolerated this well. She has no issues or complaints today. 4/16; patient presents for follow-up. We have been using EpiFix under the total contact cast. She has done well with this. Wound is smaller. She does have slough buildup. 4/23; patient presents for follow-up. We have been using EpiFix under the total contact cast. Wound appears well-healing. She does have slough buildup. 4/30; patient presents for follow-up. We have been using EpiFix under the total contact cast. Wound is smaller. She again has slough buildup. 5/6; patient with a left heel ulcer type II diabetic. We applied EpiFix #7 under a total contact cast. Her wound is making good progress. 5/14; patient presents for follow-up. We have been using EpiFix under the total contact casting wound is smaller. 5/21; patient presents for follow-up. We have been using EpiFix under the total contact cast. The wound is smaller. She has no issues or complaints today. 5/28; patient presents for follow-up. We have been using EpiFix under the total contact cast. Wound is smaller. 6/11; patient presents for follow-up. We have been using EpiFix under the total contact cast. Wound is stable. 6/17; patient presents for follow-up. We have been using EpiFix under the total contact cast. Wound is smaller. Electronic Signature(s) Signed: 03/22/2023 4:37:01 PM By: Geralyn Corwin DO Entered By: Geralyn Corwin on 03/22/2023 13:18:04 -------------------------------------------------------------------------------- Physical Exam Details Patient Name: Date of Service: Sharol Harness MY [name]. 03/22/2023 12:30 PM Medical Record Number: [id number] Patient Account Number: [id number] Date of  Birth/Sex: Treating RN: Jan 09, 1961 (61 y.o. F) Primary Care Provider: [name] Other Clinician: Referring Provider: Treating Provider/Extender: [name] in Treatment:  54 Constitutional respirations regular, non-labored and within target range for patient.. Cardiovascular 2+ dorsalis pedis/posterior tibialis pulses. Psychiatric pleasant and cooperative. Notes Open wound with granulation tissue and nonviable tissue at the heel. No signs of surrounding infection. Kesling, [name] ([id number]) [id number] Page 5 of 12 Electronic Signature(s) Signed: 03/22/2023 4:37:01 PM By: Geralyn Corwin DO Entered By: Geralyn Corwin on 03/22/2023 13:18:22 -------------------------------------------------------------------------------- Physician Orders Details Patient Name: Date of Service: [name], [name]. 03/22/2023 12:30 PM Medical Record Number: [id number] Patient Account Number: [id number] Date of Birth/Sex: Treating RN: 05-10-1961 (61 y.o. [name], [name] Primary Care Provider: [name] Other Clinician: Referring Provider: Treating Provider/Extender: [name] in Treatment: 60 Verbal / Phone Orders: No Diagnosis Coding ICD-10 Coding Code Description [phone] Non-pressure chronic ulcer of other part of left foot with fat layer exposed E11.621 Type 2 diabetes mellitus with foot ulcer E66.01 Morbid (severe) obesity due to excess calories Follow-up Appointments ppointment in 1 week. - Dr. Mikey Bussing 215pm 03/30/2023 Return A Other: - Leave dressing in place. Facility not to change dressing. Anesthetic (In clinic) Topical Lidocaine 5% applied to wound bed (In clinic) Topical Lidocaine 4% applied to wound bed Cellular or Tissue Based Products Cellular or Tissue Based Product Type: - Run IVR for EpiFix and Epicord=100% covered 05/07/22: Epicord ordered 12/08/2022: Run insurance for epicord  and theraskin. 01/19/2023: Run for organogenesis- puraply AM daptic or Mepitel. (DO NOT REMOVE). - Cellular or Tissue Based Product applied to wound bed, secured with steri-strips, cover with A Epicord #1 05/15/25 Epicord #2 05/22/22 Epicord #3 06/29/2022 Epicord #4 07/07/2022 Epifix #5 07/14/22 Epicord #6 07/20/22 Epicord # 7 07/27/22 ***Run for Grafix***PENDING Epicord #8 08/04/2022 2024-IVR epifix approved 12/29/2022 #1 epifix applied to left heel. 01/05/2023 #2 epifix applied to left heel. 01/12/2023 #3 epifix applied to left heel 01/19/2023 #4 epifix applied to left heel. 01/26/23 Epifix #5 02/08/2023 #7 epifix 02/16/2023 #8 epifix 02/23/2023 #9 epifix 03/02/23 #10 epifix 03/09/2023 #11 epifix 18mm disk 03/16/2023 #12 epifix 18mm Disk 03/22/2023 #13 epifix 18mm DIsk Bathing/ Shower/ Hygiene May shower with protection but do not get wound dressing(s) wet. Protect dressing(s) with water repellant cover (for example, large plastic bag) or a cast cover and may then take shower. Edema Control - Lymphedema / SCD / Other Elevate legs to the level of the heart or above for 30 minutes daily and/or when sitting for 3-4 times a day throughout the day. Avoid standing for long periods of time. Off-Loading Total Contact Cast to Left Lower Extremity - size 4 Greaser, [name] ([id number]) [id number] Page 6 of 12 Additional Orders / Instructions Follow Nutritious Diet - Monitor/Control Blood Sugar Wound Treatment Wound #2 - Calcaneus Wound Laterality: Left, Distal Cleanser: Soap and Water 1 x Per Week/30 Days Discharge Instructions: May shower and wash wound with dial antibacterial soap and water prior to dressing change. Cleanser: Wound Cleanser 1 x Per Week/30 Days Discharge Instructions: Cleanse the wound with wound cleanser prior to applying a clean dressing using gauze sponges, not tissue or cotton balls. Prim Dressing: epifix 1 x Per Week/30 Days ary Discharge  Instructions: applied by provider Prim Dressing: adaptic and steri-strips 1 x Per Week/30 Days ary Discharge Instructions: secured the epifix Secondary Dressing: ABD Pad, 8x10 1 x Per Week/30 Days Discharge Instructions: Apply over primary dressing as directed. Secondary Dressing: Woven Gauze Sponge, Non-Sterile 4x4 in (Home Health) 1 x Per Week/30 Days Discharge Instructions: Apply over primary dressing as directed. Secured With: [employer], 4.5x3.1 (in/yd)  1 x Per Week/30 Days Discharge Instructions: Secure with Kerlix as directed. Add-Ons: TCC size 4 1 x Per Week/30 Days Discharge Instructions: last layer applied by physician. Electronic Signature(s) Signed: 03/22/2023 4:37:01 PM By: Geralyn Corwin DO Entered By: Geralyn Corwin on 03/22/2023 13:18:31 -------------------------------------------------------------------------------- Problem List Details Patient Name: Date of Service: [name]. 03/22/2023 12:30 PM Medical Record Number: [id number] Patient Account Number: [id number] Date of Birth/Sex: Treating RN: July 10, 1961 (61 y.o. [name], [name] Primary Care Provider: [name] Other Clinician: Referring Provider: Treating Provider/Extender: [name] in Treatment: 74 Active Problems ICD-10 Encounter Code Description Active Date MDM Diagnosis [phone] Non-pressure chronic ulcer of other part of left foot with fat layer exposed 03/05/2022 No Yes E11.621 Type 2 diabetes mellitus with foot ulcer 03/05/2022 No Yes E66.01 Morbid (severe) obesity due to excess calories 03/05/2022 No Yes Inactive Problems Resolved Problems Livesey, [name] ([id number]) [id number] Page 7 of 12 Electronic Signature(s) Signed: 03/22/2023 4:37:01 PM By: Geralyn Corwin DO Entered By: Geralyn Corwin on 03/22/2023 13:17:09 -------------------------------------------------------------------------------- Progress Note  Details Patient Name: Date of Service: [name] [name] [name]. 03/22/2023 12:30 PM Medical Record Number: [id number] Patient Account Number: [id number] Date of Birth/Sex: Treating RN: 27-Aug-1961 (61 y.o. F) Primary Care Provider: [name] Other Clinician: Referring Provider: Treating Provider/Extender: [name] in Treatment: 42 Subjective Chief Complaint Information obtained from Patient 03/05/2022; left foot wound History of Present Illness (HPI) Admission 03/05/2022 Ms. Carinna Uhle is a 62 year old female with a past medical history of uncontrolled insulin-dependent type 2 diabetes, tobacco user and chronic diastolic heart failure that presents to the clinic for a 78-month history of wound to her left heel. She states she had a left ankle fusion in September 2022. She states that she always had a wound after the surgery and it never healed. She has home health and they have been doing compression wraps along with silver alginate to the wound bed. She currently denies signs of infection. 6/8; this is a 62 year old woman with type 2 diabetes. She developed a wound on her left Achilles heel just above the tip of the heel in the setting of recurrent ankle surgeries in late 2022. I have seen some of these results from either the cast or the surgical boots that are put on after these operations. She thinks this may be the case. And a sense of pressure ulcer. In any case that she has been using Santyl Hydrofera Blue under compression. She is not wearing any footwear at home as she cannot find anything to accommodate the wrap 6/22; patient presents for follow-up. She has home health that comes out once a week to help with dressing changes. She has no issues or complaints today. We have been using Hydrofera Blue under compression therapy. 7/6; patient presents for follow-up. She states that home health did not come out for the past 2 weeks. It is unclear why. We have  been using Hydrofera Blue and Santyl under compression therapy. 7/13; patient presents for follow-up. She did not take the oral antibiotics prescribed at last clinic visit. We have been using Hydrofera Blue with gentamicin/mupirocin ointment under compression therapy. She has no issues or complaints today. She denies signs of infection. 7/20; patient presents for follow-up. We have been using Hydrofera Blue with antibiotic ointment under 3 layer compression. She has home health that change the dressing once. They put collagen on the wound bed. She also reports falling yesterday and hitting her right foot. She  has no pain to the area today. 7/27; patient presents for follow-up. We have been using Hydrofera Blue under 3 layer compression. She has home health that changes the dressing. She has no issues or complaints today. 8/3; patient presents for follow-up. We continues to use Hydrofera Blue under 3 layer compression. She has no issues or complaints today. She has been approved for Epicord at 100%. 8/11; patient presents for follow-up. We have been using Hydrofera Blue under 3 layer compression. She has been approved for Epicord and we have this today. She is in agreement with having this applied. 8/18; patient presents for follow-up. Epicord #1 was placed in standard fashion last clinic visit. She has no issues or complaints today. 9/18; Unfortunately patient has missed her last clinic appointments due to falling and breaking her right ankle. We have been following her for her left ankle wound. She has also developed a large blister to the left heel over the past week that has ruptured and dried. She currently resides In [facility]. Wound9/25; patient presents for follow-up. We have been using Hydrofera Blue and Santyl to the original left ankle wound and Xeroform to the dried blistered. We have been wrapping her with compression therapy. She resides in a facility and they changed the wrap once  this past week. She has no issues or complaints today. She denies signs of infection. 10/3; patient presents for follow-up. We have been using Xeroform to the heel wound and Epicort to the left ankle wound All under compression therapy. She has no issues or complaints today. 10/10; patient presents for follow-up. We have been using Epicort to the left ankle wound and Santyl and Hydrofera Blue to the heel wound. All under compression therapy. she has no issues or complaints today. 10/16; patient presents for follow-up. Epicort was placed in standard fashion to the superior wound and onto the heel and Santyl and Hydrofera Blue. She states that the wrap got wet during her shower and her facility was able to rewrap with Kerlix/Coban. 10/23; patient presents for follow-up. Epicord was placed to the wound beds last clinic visit. We continue Kerlix/Coban. She has no issues or complaints today. 10/31; Patient presents for follow-up. Epicord was placed to the wound beds last clinic visit. The original wound is almost healed. We have done this under Kerlix/Coban. She denies signs of infection. 11/10; patient presents for follow-up. Patient's last epi cord was placed in standard fashion at last clinic visit. The original wound has healed. She still has a heel wound. Grafix was approved however too costly for the patient. We will rerun epi cord to see if she can have more applications. She had done very well with Burdett, [name] ([id number]) [phone].pdf Page 8 of 12 this. We have been using Hydrofera Blue to the heel under Kerlix/Coban. 11/21; patient presents for follow-up. We have been using Hydrofera Blue and Santyl to the heel under Kerlix/Coban. She switched to a new healthcare insurance and again The skin substitute is too costly for the patient. She denies signs of infection. 11/28; patient presents for follow-up. Her facility did not change the wrap last clinic visit due to  lack of staffing. The wrap was taken off and Hydrofera Blue dressing has been in place for the past week. She has no issues or complaints today. 12/5; patient presents for follow-up. She states she has had more drainage over the past week. They did not change the wrap at her facility. She states the nurse will be back this  week. She is also been doing a lot of physical therapy. She has been using the Prevalon boot while in bed. 12/12; patient presents for follow-up. We have been using Hydrofera Blue with antibiotic ointment under 4-layer compression. She is tolerated the wrap well. She states she is using her Prevalon boot while in the wheelchair. She has no issues or complaints today. There is been improvement in wound healing. 12/19; patient presents for follow-up. We have been using Hydrofera Blue and antibiotic ointment to the wound bed under 4-layer compression. Her facility change the wrap once. She states she has been using the Prevalon boot in bed but not in the wheelchair due to issues with transferring. Overall there is still improvement in wound healing. 1/16; patient presents for follow-up. She missed her last clinic appointment. We have been using Hydrofera Blue and antibiotic ointment under 4-layer compression. At the facility this has only been changed twice over the past 3 weeks. They are not using 4-layer compression. She came in with Kerlix/Coban. 1/23; patient presents for follow-up. We have been using Santyl and Hydrofera Blue under 4-layer compression. Patient has no issues or complaints today. 2/1; patient presents for follow-up. Patient's been using Santyl and Hydrofera Blue to the wound bed. She has developed more slough and now a mild odor. She states she is wearing the Prevalon boot at night. It is unclear if she is offloading this area during the day. 2/13; patient presents for follow-up. Patient's been using Dakin's wet-to-dry dressings. She received her Keystone antibiotic  ointment in the mail. She has not used this yet. She reports using her Prevalon boot. She has no issues or complaints today. 2/20; patient presents for follow-up. She has been using Keystone antibiotic ointment with Hydrofera Blue. She has no issues or complaints today. Due to transportation she cannot do the total contact cast today. She is scheduled for next week to have this placed. 2/27; patient presents for follow-up. Plan is for the total contact cast today. We have been using Keystone and Hydrofera Blue to the wound bed. She forgot her Keystone antibiotic ointment. She has no issues or complaints today. 2/29; patient presents for follow-up. Patient presents for her obligatory cast change. She has no issues or complaints today. 3/5; patient presents for follow-up. She has had the cast in place for the past week and has tolerated this well. She has no issues or complaints today. We have been using PolyMem with Keystone antibiotic ointment. 12/15/2022: The wound is quite clean and measured smaller today. 3/19; we have been using Keystone antibiotic ointment with PolyMem silver under the total contact cast. Overall there is been improvement in wound healing. 3/26; patient presents for follow-up. She is been approved for epi fix. She would like to proceed with this today. Previously we have been using Keystone antibiotic ointment with PolyMem silver under the total contact cast. She has no issues or complaints. 01/05/2023: The wound is clean and she is tolerating total contact casting without difficulty. She had her first application of EpiFix last week. 4/9; patient presents for follow-up. We have been applying EpiFix under the total contact cast for the past 2 weeks. She is tolerated this well. She has no issues or complaints today. 4/16; patient presents for follow-up. We have been using EpiFix under the total contact cast. She has done well with this. Wound is smaller. She does have slough  buildup. 4/23; patient presents for follow-up. We have been using EpiFix under the total contact cast. Wound appears  well-healing. She does have slough buildup. 4/30; patient presents for follow-up. We have been using EpiFix under the total contact cast. Wound is smaller. She again has slough buildup. 5/6; patient with a left heel ulcer type II diabetic. We applied EpiFix #7 under a total contact cast. Her wound is making good progress. 5/14; patient presents for follow-up. We have been using EpiFix under the total contact casting wound is smaller. 5/21; patient presents for follow-up. We have been using EpiFix under the total contact cast. The wound is smaller. She has no issues or complaints today. 5/28; patient presents for follow-up. We have been using EpiFix under the total contact cast. Wound is smaller. 6/11; patient presents for follow-up. We have been using EpiFix under the total contact cast. Wound is stable. 6/17; patient presents for follow-up. We have been using EpiFix under the total contact cast. Wound is smaller. Patient History Information obtained from Patient, Chart. Family History Cancer - Father,Mother, Diabetes - Mother,Maternal Grandparents,Paternal Grandparents, Heart Disease - Siblings,Maternal Grandparents, Hypertension - Siblings, Tuberculosis - Maternal Grandparents,Paternal Grandparents. Social History Current every day smoker, Marital Status - Single, Alcohol Use - Never, Drug Use - No History, Caffeine Use - Daily. Medical History Hematologic/Lymphatic Patient has history of Anemia Respiratory Patient has history of Chronic Obstructive Pulmonary Disease (COPD) Cardiovascular Patient has history of Congestive Heart Failure - Weighs self daily, Hypertension, Myocardial Infarction, Peripheral Venous Disease Endocrine Patient has history of Type II Diabetes Neurologic [place], [name] ([id number]) [id number] Page 9 of 12 Patient has  history of Neuropathy Medical A Surgical History Notes nd Gastrointestinal GERD, Peptic Ulcer Disease Musculoskeletal Broke left leg 3 years ago, 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 Vitals Time Taken: 12:53 PM, Height: 66 in, Weight: 339 lbs, BMI: 54.7, Temperature: 98.1 F, Pulse: 89 bpm, Respiratory Rate: 18 breaths/min, Blood Pressure: 105/69 mmHg, Capillary Blood Glucose: 191 mg/dl. Cardiovascular 2+ dorsalis pedis/posterior tibialis pulses. Psychiatric pleasant and cooperative. General Notes: Open wound with granulation tissue and nonviable tissue at the heel. No signs of surrounding infection. Integumentary (Hair, Skin) Wound #2 status is Open. Original cause of wound was Blister. The date acquired was: 06/15/2022. The wound has been in treatment 38 weeks. The wound is located on the Left,Distal Calcaneus. The wound measures 0.9cm length x 1cm width x 0.1cm depth; 0.707cm^2 area and 0.071cm^3 volume. There is Fat Layer (Subcutaneous Tissue) exposed. There is no tunneling or undermining noted. There is a medium amount of serosanguineous drainage noted. The wound margin is distinct with the outline attached to the wound base. There is large (67-100%) red, pink granulation within the wound bed. There is a small (1-33%) amount of necrotic tissue within the wound bed including Adherent Slough. The periwound skin appearance had no abnormalities noted for texture. The periwound skin appearance had no abnormalities noted for color. The periwound skin appearance did not exhibit: Dry/Scaly, Maceration. Periwound temperature was noted as No Abnormality. Assessment Active Problems ICD-10 Non-pressure chronic ulcer of other part of left foot with fat layer exposed Type 2 diabetes mellitus with foot ulcer Morbid (severe) obesity due to excess calories Patient's wound has shown  improvement in size and appearance since last clinic visit. I debrided nonviable tissue. EpiFix was placed in standard fashion. The total contact cast was placed in standard fashion. Follow-up in 1 week. Procedures Wound #2 Pre-procedure diagnosis of Wound #2 is a Diabetic Wound/Ulcer of the Lower Extremity located on the Left,Distal Calcaneus .  Severity of Tissue Pre Debridement is: Fat layer exposed. There was a Excisional Skin/Subcutaneous Tissue Debridement with a total area of 0.71 sq cm performed by Geralyn Corwin, DO. With the following instrument(s): Curette to remove Viable and Non-Viable tissue/material. Material removed includes Callus, Subcutaneous Tissue, Slough, Skin: Dermis, and Skin: Epidermis after achieving pain control using Lidocaine 4% Topical Solution. A time out was conducted at 13:00, prior to the start of the procedure. A Minimum amount of bleeding was controlled with Pressure. The procedure was tolerated well with a pain level of 0 throughout and a pain level of 0 following the procedure. Post Debridement Measurements: 0.9cm length x 1cm width x 0.1cm depth; 0.071cm^3 volume. Character of Wound/Ulcer Post Debridement is improved. Severity of Tissue Post Debridement is: Fat layer exposed. Post procedure Diagnosis Wound #2: Same as Pre-Procedure Pre-procedure diagnosis of Wound #2 is a Diabetic Wound/Ulcer of the Lower Extremity located on the Left,Distal Calcaneus. A skin graft procedure using a bioengineered skin substitute/cellular or tissue based product was performed by Geralyn Corwin, DO with the following instrument(s): Forceps and Scissors. Epifix was applied and secured with Steri-Strips and adaptic. 2 sq cm of product was utilized and 0 sq cm was wasted. Post Application, no dressing was applied. A Time Out was conducted at 13:05, prior to the start of the procedure. The procedure was tolerated well with a pain level of 0 throughout and a pain level of 0 following  the procedure. Post procedure Diagnosis Wound #2: Same as Pre-Procedure . Bucks, [name] ([id number]) [id number] Page 10 of 12 Pre-procedure diagnosis of Wound #2 is a Diabetic Wound/Ulcer of the Lower Extremity located on the Left,Distal Calcaneus . There was a T Research scientist (life sciences) otal Procedure by Geralyn Corwin, DO. Post procedure Diagnosis Wound #2: Same as Pre-Procedure Notes: size 4. Plan Follow-up Appointments: Return Appointment in 1 week. - Dr. Mikey Bussing 215pm 03/30/2023 Other: - Leave dressing in place. Facility not to change dressing. Anesthetic: (In clinic) Topical Lidocaine 5% applied to wound bed (In clinic) Topical Lidocaine 4% applied to wound bed Cellular or Tissue Based Products: Cellular or Tissue Based Product Type: - Run IVR for EpiFix and Epicord=100% covered 05/07/22: Epicord ordered 12/08/2022: Run insurance for epicord and theraskin. 01/19/2023: Run for organogenesis- puraply AM Cellular or Tissue Based Product applied to wound bed, secured with steri-strips, cover with Adaptic or Mepitel. (DO NOT REMOVE). - Epicord #1 05/15/25 Epicord #2 05/22/22 Epicord #3 06/29/2022 Epicord #4 07/07/2022 Epifix #5 07/14/22 Epicord #6 07/20/22 Epicord # 7 07/27/22 ***Run for Grafix***PENDING Epicord #8 08/04/2022 2024-IVR epifix approved 12/29/2022 #1 epifix applied to left heel. 01/05/2023 #2 epifix applied to left heel. 01/12/2023 #3 epifix applied to left heel 01/19/2023 #4 epifix applied to left heel. 01/26/23 Epifix #5 02/08/2023 #7 epifix 02/16/2023 #8 epifix 02/23/2023 #9 epifix 03/02/23 #10 epifix 03/09/2023 #11 epifix 18mm disk 03/16/2023 #12 epifix 18mm Disk 03/22/2023 #13 epifix 18mm DIsk Bathing/ Shower/ Hygiene: May shower with protection but do not get wound dressing(s) wet. Protect dressing(s) with water repellant cover (for example, large plastic bag) or a cast cover and may then take shower. Edema Control - Lymphedema / SCD / Other: Elevate legs to the level of the  heart or above for 30 minutes daily and/or when sitting for 3-4 times a day throughout the day. Avoid standing for long periods of time. Off-Loading: T Contact Cast to Left Lower Extremity - size 4 otal Additional Orders / Instructions: Follow Nutritious Diet - Monitor/Control Blood Sugar WOUND #2: - Calcaneus  Wound Laterality: Left, Distal Cleanser: Soap and Water 1 x Per Week/30 Days Discharge Instructions: May shower and wash wound with dial antibacterial soap and water prior to dressing change. Cleanser: Wound Cleanser 1 x Per Week/30 Days Discharge Instructions: Cleanse the wound with wound cleanser prior to applying a clean dressing using gauze sponges, not tissue or cotton balls. Prim Dressing: epifix 1 x Per Week/30 Days ary Discharge Instructions: applied by provider Prim Dressing: adaptic and steri-strips 1 x Per Week/30 Days ary Discharge Instructions: secured the epifix Secondary Dressing: ABD Pad, 8x10 1 x Per Week/30 Days Discharge Instructions: Apply over primary dressing as directed. Secondary Dressing: Woven Gauze Sponge, Non-Sterile 4x4 in (Home Health) 1 x Per Week/30 Days Discharge Instructions: Apply over primary dressing as directed. Secured With: [employer], 4.5x3.1 (in/yd) 1 x Per Week/30 Days Discharge Instructions: Secure with Kerlix as directed. Add-Ons: TCC size 4 1 x Per Week/30 Days Discharge Instructions: last layer applied by physician. 1. In office sharp debridement 2. EpiFix placed in standard fashion 3. T contact cast placed in standard fashionleft lower extremity otal 4. Follow-up in 1 week Electronic Signature(s) Signed: 03/22/2023 4:37:01 PM By: Geralyn Corwin DO Entered By: Geralyn Corwin on 03/22/2023 13:19:50 -------------------------------------------------------------------------------- HxROS Details Patient Name: Date of Service: [name] [name], [name]. 03/22/2023 12:30 PM Medical Record Number: [id number] Patient Account Number:  [id number] Date of Birth/Sex: Treating RN: 1961/02/12 (61 y.o. F) Primary Care Provider: [name] Other Clinician: Referring Provider: Treating Provider/Extender: [name] in Treatment: [address], [name] ([id number]) [id number] Page 11 of 12 Information Obtained From Patient Chart Hematologic/Lymphatic Medical History: Positive for: Anemia Respiratory Medical History: Positive for: Chronic Obstructive Pulmonary Disease (COPD) Cardiovascular Medical History: Positive for: Congestive Heart Failure - Weighs self daily; Hypertension; Myocardial Infarction; Peripheral Venous Disease Gastrointestinal Medical History: Past Medical History Notes: GERD, Peptic Ulcer Disease Endocrine Medical History: Positive for: Type II Diabetes Time with diabetes: 16 years Treated with: Insulin, Oral agents Blood sugar tested every day: Yes Tested : Twice daily Musculoskeletal Medical History: Past Medical History Notes: Broke left leg 3 years ago, 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, foot fused Neurologic Medical History: Positive for: Neuropathy Psychiatric Medical History: Past Medical History Notes: Depression Immunizations Pneumococcal Vaccine: Received Pneumococcal Vaccination: No Implantable Devices None Family and Social History Cancer: Yes - Father,Mother; Diabetes: Yes - Mother,Maternal Grandparents,Paternal Grandparents; Heart Disease: Yes - Siblings,Maternal Grandparents; Hypertension: Yes - Siblings; Tuberculosis: Yes - Maternal Grandparents,Paternal Grandparents; Current every day smoker; Marital Status - Single; Alcohol Use: Never; Drug Use: No History; Caffeine Use: Daily; Financial Concerns: No; Food, Clothing or Shelter Needs: No; Support System Lacking: No; Transportation Concerns: No Electronic Signature(s) Signed: 03/22/2023 4:37:01 PM By: Geralyn Corwin DO Entered By: Geralyn Corwin on 03/22/2023  13:18:09 Total Contact Cast Details -------------------------------------------------------------------------------- Chizek, Navya [name] ([id number]) [id number] Page 12 of 12 Patient Name: Date of Service: [name]. 03/22/2023 12:30 PM Medical Record Number: [id number] Patient Account Number: [id number] Date of Birth/Sex: Treating RN: Dec 16, 1960 (62 y.o. [name] Primary Care Provider: [name] Other Clinician: Referring Provider: Treating Provider/Extender: [name]6 T Contact Cast Applied for Wound Assessment: otal Wound #2 Left,Distal Calcaneus Performed By: Physician Geralyn Corwin, DO Post Procedure Diagnosis Same as Pre-procedure Notes size 4 Electronic Signature(s) Signed: 03/22/2023 4:37:01 PM By: Geralyn Corwin DO Signed: 03/22/2023 5:39:10 PM By: Shawn Stall RN, BSN Entered By: Shawn Stall on 03/22/2023 13:07:12 -------------------------------------------------------------------------------- SuperBill Details Patient Name: Date of Service:  [name] N, [name]. 03/22/2023 Medical Record Number: [id number] Patient Account Number: [id number] Date of Birth/Sex: Treating RN: [birth year] (62 y.o. [name] Primary Care Provider: [name] Other Clinician: Referring Provider: Treating Provider/Extender: [name]4 Diagnosis Coding ICD-10 Codes Code Description [id number] Non-pressure chronic ulcer of other part of left foot with fat layer exposed E11.621 Type 2 diabetes mellitus with foot ulcer E66.01 Morbid (severe) obesity due to excess calories Facility Procedures : CPT4 Code: 91478295 Description: Q4186 Epifix 18mm disk qty 3 Modifier: Quantity: 3 : CPT4 Code: [id number] Description: 15275 - SKIN SUB GRAFT FACE/NK/HF/G ICD-10 Diagnosis Description L97.522 Non-pressure chronic ulcer of other part of left foot  with fat layer exposed E11.621 Type 2 diabetes mellitus with foot ulcer Modifier: Quantity: 1 Physician Procedures : CPT4 Code Description Modifier [id number] 15275 - WC PHYS SKIN SUB GRAFT FACE/NK/HF/G ICD-10 Diagnosis Description L97.522 Non-pressure chronic ulcer of other part of left foot with fat layer exposed E11.621 Type 2 diabetes mellitus with foot ulcer Quantity: 1 Electronic Signature(s) Signed: 03/22/2023 4:37:01 PM By: Geralyn Corwin DO Entered By: Geralyn Corwin on 03/22/2023 13:21:24

## 2023-03-22 NOTE — Progress Notes (Signed)
Dettman, Tamsyn [name] ([id number]) [id number] Page 1 of 8 Visit Report for 03/22/2023 Arrival Information Details Patient Name: Date of Service: [name] 03/22/2023 12:30 PM Medical Record Number: [id number] Patient Account Number: [id number] Date of Birth/Sex: Treating RN: [birth year] (62 y.o. F) Primary Care Romuald Mccaslin: [name] Other Clinician: Referring Elesha Thedford: Treating Demmi Sindt/Extender: [name] in Treatment: 46 Visit Information History Since Last Visit Added or deleted any medications: No Patient Arrived: Wheel Chair Any new allergies or adverse reactions: No Arrival Time: 12:52 Had a fall or experienced change in No Accompanied By: self activities of daily living that may affect Transfer Assistance: None risk of falls: Patient Identification Verified: Yes Signs or symptoms of abuse/neglect since last No Secondary Verification Process Completed: Yes visito Patient Requires Transmission-Based Precautions: No Hospitalized since last visit: No Patient Has Alerts: No Implantable device outside of the clinic No excluding cellular tissue based products placed in the center since last visit: Has Dressing in Place as Prescribed: Yes Has Footwear/Offloading in Place as Yes Prescribed: Left: Removable Cast Walker/Walking Boot T Contact Cast otal Pain Present Now: Yes Electronic Signature(s) Signed: 03/22/2023 4:27:49 PM By: Thayer Dallas Entered By: Thayer Dallas on 03/22/2023 12:53:47 -------------------------------------------------------------------------------- Encounter Discharge Information Details Patient Name: Date of Service: [name] Bushy [name]. 03/22/2023 12:30 PM Medical Record Number: [id number] Patient Account Number: [id number] Date of Birth/Sex: Treating RN: [birth year] (61 y.o. [name] Primary Care Kinda Pottle: [name] Other Clinician: Referring Keanna Tugwell: Treating  Ashkan Chamberland/Extender: [name] in Treatment: 26 Encounter Discharge Information Items Post Procedure Vitals Discharge Condition: Stable Temperature (F): 98.1 Ambulatory Status: Wheelchair Pulse (bpm): 89 Discharge Destination: Home Respiratory Rate (breaths/min): 18 Transportation: Private Auto Blood Pressure (mmHg): 105/69 Accompanied By: self Schedule Follow-up Appointment: Yes Clinical Summary of Care: Electronic Signature(s) Signed: 03/22/2023 5:39:10 PM By: Shawn Stall RN, BSN Entered By: Shawn Stall on 03/22/2023 13:09:32 Schnabel, Jamiesha [name] ([id number]) [id number] Page 2 of 8 -------------------------------------------------------------------------------- Lower Extremity Assessment Details Patient Name: Date of Service: [name] Bushy [name]. 03/22/2023 12:30 PM Medical Record Number: [id number] Patient Account Number: [id number] Date of Birth/Sex: Treating RN: May 26, 1961 (62 y.o. F) Primary Care Latana Colin: [name] Other Clinician: Referring Jedidiah Demartini: Treating Micaylah Bertucci/Extender: [name]4 Edema Assessment Assessed: [name]: No] [name]: No] Edema: [Left: Ye] [Right: s] Calf Left: Right: Point of Measurement: 28 cm From Medial Instep 47 cm Ankle Left: Right: Point of Measurement: 3 cm From Medial Instep 30 cm Electronic Signature(s) Signed: 03/22/2023 4:27:49 PM By: Thayer Dallas Entered By: Thayer Dallas on 03/22/2023 12:52:55 -------------------------------------------------------------------------------- Multi Wound Chart Details Patient Name: Date of Service: [name] Bushy [name]. 03/22/2023 12:30 PM Medical Record Number: [id number] Patient Account Number: [id number] Date of Birth/Sex: Treating RN: Jul 14, 1961 (61 y.o. F) Primary Care Jayquan Bradsher: [name] Other Clinician: Referring Jerzee Jerome: Treating Coady Train/Extender: [name]4 Vital Signs Height(in): 66 Capillary Blood Glucose(mg/dl): 623 Weight(lbs): 762 Pulse(bpm): 89 Body Mass Index(BMI): 54.7 Blood Pressure(mmHg): 105/69 Temperature(F): 98.1 Respiratory Rate(breaths/min): 18 [2:Photos:] [N/A:N/A] Left, Distal Calcaneus N/A N/A Wound Location: Blister N/A N/A Wounding Event: Diabetic Wound/Ulcer of the Lower N/A N/A Primary Etiology: Extremity Anemia, Chronic Obstructive N/A N/A Comorbid History: [name] ([id number]) [id number] Page 3 of 8 Pulmonary Disease (COPD), Congestive Heart Failure, Hypertension, Myocardial Infarction, Peripheral Venous Disease, Type II Diabetes, Neuropathy 06/15/2022 N/A N/A Date Acquired: 45 N/A N/A Weeks of Treatment: Open N/A N/A Wound Status: No N/A N/A Wound  Recurrence: 0.9x1x0.1 N/A N/A Measurements L x W x D (cm) 0.707 N/A N/A A (cm) : rea 0.071 N/A N/A Volume (cm) : 95.00% N/A N/A % Reduction in A rea: 95.00% N/A N/A % Reduction in Volume: Grade 2 N/A N/A Classification: Medium N/A N/A Exudate A mount: Serosanguineous N/A N/A Exudate Type: red, brown N/A N/A Exudate Color: Distinct, outline attached N/A N/A Wound Margin: Large (67-100%) N/A N/A Granulation A mount: Red, Pink N/A N/A Granulation Quality: Small (1-33%) N/A N/A Necrotic A mount: Fat Layer (Subcutaneous Tissue): Yes N/A N/A Exposed Structures: Fascia: No Tendon: No Muscle: No Joint: No Bone: No Medium (34-66%) N/A N/A Epithelialization: Debridement - Excisional N/A N/A Debridement: Pre-procedure Verification/Time Out 13:00 N/A N/A Taken: Lidocaine 4% Topical Solution N/A N/A Pain Control: Callus, Subcutaneous, Slough N/A N/A Tissue Debrided: Skin/Subcutaneous Tissue N/A N/A Level: 0.71 N/A N/A Debridement A (sq cm): rea Curette N/A N/A Instrument: Minimum N/A N/A Bleeding: Pressure N/A N/A Hemostasis A chieved: 0 N/A N/A Procedural  Pain: 0 N/A N/A Post Procedural Pain: Procedure was tolerated well N/A N/A Debridement Treatment Response: 0.9x1x0.1 N/A N/A Post Debridement Measurements L x W x D (cm) 0.071 N/A N/A Post Debridement Volume: (cm) No Abnormalities Noted N/A N/A Periwound Skin Texture: Maceration: No N/A N/A Periwound Skin Moisture: Dry/Scaly: No No Abnormalities Noted N/A N/A Periwound Skin Color: No Abnormality N/A N/A Temperature: Cellular or Tissue Based Product N/A N/A Procedures Performed: Debridement T Contact Cast otal Treatment Notes Wound #2 (Calcaneus) Wound Laterality: Left, Distal Cleanser Soap and Water Discharge Instruction: May shower and wash wound with dial antibacterial soap and water prior to dressing change. Wound Cleanser Discharge Instruction: Cleanse the wound with wound cleanser prior to applying a clean dressing using gauze sponges, not tissue or cotton balls. Peri-Wound Care Topical Primary Dressing epifix Discharge Instruction: applied by Sybel Standish adaptic and steri-strips Discharge Instruction: secured the epifix Secondary Dressing ABD Pad, 8x10 Discharge Instruction: Apply over primary dressing as directed. Woven Gauze Sponge, Non-Sterile 4x4 in Discharge Instruction: Apply over primary dressing as directed. Secured With [place], Kajal [name] [phone] [id number]) [id number] Page 4 of 8 Kerlix Roll Sterile, 4.5x3.1 (in/yd) Discharge Instruction: Secure with Kerlix as directed. Compression Wrap Compression Stockings Add-Ons TCC size 4 Discharge Instruction: last layer applied by physician. Electronic Signature(s) Signed: 03/22/2023 4:37:01 PM By: Geralyn Corwin DO Entered By: Geralyn Corwin on 03/22/2023 13:17:17 -------------------------------------------------------------------------------- Multi-Disciplinary Care Plan Details Patient Name: Date of Service: [name] Bushy [name]. 03/22/2023 12:30 PM Medical Record Number:  [id number] Patient Account Number: [id number] Date of Birth/Sex: Treating RN: 12-May-1961 (61 y.o. [name], [name] Primary Care Lisaanne Lawrie: [name] Other Clinician: Referring Raylyn Speckman: Treating Moriya Mitchell/Extender: [name] in Treatment: 33 Active Inactive Abuse / Safety / Falls / Self Care Management Nursing Diagnoses: History of Falls Goals: Patient will not experience any injury related to falls Date Initiated: 03/05/2022 Target Resolution Date: 04/30/2023 Goal Status: Active Interventions: Assess fall risk on admission and as needed Assess: immobility, friction, shearing, incontinence upon admission and as needed Assess impairment of mobility on admission and as needed per policy Notes: 04/30/22: Fall prevention ongoing, recent fall. Wound/Skin Impairment Nursing Diagnoses: Impaired tissue integrity Goals: Patient/caregiver will verbalize understanding of skin care regimen Date Initiated: 03/05/2022 Target Resolution Date: 04/30/2023 Goal Status: Active Ulcer/skin breakdown will have a volume reduction of 30% by week 4 Date Initiated: 03/05/2022 Date Inactivated: 04/30/2022 Target Resolution Date: 05/02/2022 Goal Status: Met Ulcer/skin breakdown will have a volume reduction of 50% by week 8 Date  Initiated: 04/30/2022 Date Inactivated: 07/07/2022 Target Resolution Date: 07/04/2022 Unmet Reason: patient sent three Goal Status: Unmet weeks inpatient and rehab before returning to wound center. Interventions: Assess patient/caregiver ability to obtain necessary supplies Assess patient/caregiver ability to perform ulcer/skin care regimen upon admission and as needed Weckwerth, Yunuen [name] ([id number]) [id number] Page 5 of 8 Assess ulceration(s) every visit Provide education on smoking Provide education on ulcer and skin care Treatment Activities: Topical wound management initiated : 03/05/2022 Notes: 04/30/22: Wound care regimen  continues. Electronic Signature(s) Signed: 03/22/2023 5:39:10 PM By: Shawn Stall RN, BSN Entered By: Shawn Stall on 03/22/2023 13:08:09 -------------------------------------------------------------------------------- Pain Assessment Details Patient Name: Date of Service: Sharol Harness MY [name]. 03/22/2023 12:30 PM Medical Record Number: [id number] Patient Account Number: [id number] Date of Birth/Sex: Treating RN: [birth year] (61 y.o. F) Primary Care Alayne Estrella: [name] Other Clinician: Referring Tila Millirons: Treating Digby Groeneveld/Extender: [name]4 Active Problems Location of Pain Severity and Description of Pain Patient Has Paino Yes Site Locations Pain Location: Generalized Pain Rate the pain. Current Pain Level: 8 Pain Management and Medication Current Pain Management: Electronic Signature(s) Signed: 03/22/2023 4:27:49 PM By: Thayer Dallas Entered By: Thayer Dallas on 03/22/2023 12:55:26 -------------------------------------------------------------------------------- Patient/Caregiver Education Details Patient Name: Date of Service: [name] 6/17/2024andnbsp12:30 PM Medical Record Number: [id number] Patient Account Number: [id number] Date of Birth/Gender: Treating RN: 1961-07-03 (62 y.o. [name], Bobbi Baade, Hilaria [name] ([id number]) [id number] Page 6 of 8 Primary Care Physician: [name] Other Clinician: Referring Physician: Treating Physician/Extender: [name] in Treatment: 89 Education Assessment Education Provided To: Patient Education Topics Provided Wound/Skin Impairment: Handouts: Caring for Your Ulcer Methods: Explain/Verbal Responses: Reinforcements needed Electronic Signature(s) Signed: 03/22/2023 5:39:10 PM By: Shawn Stall RN, BSN Entered By: Shawn Stall on 03/22/2023  13:08:21 -------------------------------------------------------------------------------- Wound Assessment Details Patient Name: Date of Service: [name] Bushy [name]. 03/22/2023 12:30 PM Medical Record Number: [id number] Patient Account Number: [id number] Date of Birth/Sex: Treating RN: 11-Nov-1960 (61 y.o. F) Primary Care Shayla Heming: [name] Other Clinician: Referring Ahmari Garton: Treating Jobina Maita/Extender: [name]4 Wound Status Wound Number: 2 Primary Diabetic Wound/Ulcer of the Lower Extremity Etiology: Wound Location: Left, Distal Calcaneus Wound Open Wounding Event: Blister Status: Date Acquired: 06/15/2022 Comorbid Anemia, Chronic Obstructive Pulmonary Disease (COPD), Weeks Of Treatment: 38 History: Congestive Heart Failure, Hypertension, Myocardial Infarction, Clustered Wound: No Peripheral Venous Disease, Type II Diabetes, Neuropathy Photos Wound Measurements Length: (cm) 0.9 Width: (cm) 1 Depth: (cm) 0.1 Area: (cm) 0.707 Volume: (cm) 0.071 % Reduction in Area: 95% % Reduction in Volume: 95% Epithelialization: Medium (34-66%) Tunneling: No Undermining: No Wound Description Classification: Grade 2 Wound Margin: Distinct, outline attached Exudate Amount: Medium Exudate Type: Serosanguineous Leiva, Emsley [name] ([id number]) Exudate Color: red, brown Foul Odor After Cleansing: No Slough/Fibrino Yes [id number] Page 7 of 8 Wound Bed Granulation Amount: Large (67-100%) Exposed Structure Granulation Quality: Red, Pink Fascia Exposed: No Necrotic Amount: Small (1-33%) Fat Layer (Subcutaneous Tissue) Exposed: Yes Necrotic Quality: Adherent Slough Tendon Exposed: No Muscle Exposed: No Joint Exposed: No Bone Exposed: No Periwound Skin Texture Texture Color No Abnormalities Noted: Yes No Abnormalities Noted: Yes Moisture Temperature / Pain No Abnormalities Noted: No Temperature: No  Abnormality Dry / Scaly: No Maceration: No Treatment Notes Wound #2 (Calcaneus) Wound Laterality: Left, Distal Cleanser Soap and Water Discharge Instruction: May shower and wash wound with dial antibacterial soap and water prior to dressing change. Wound Cleanser Discharge Instruction: Cleanse the wound with wound  cleanser prior to applying a clean dressing using gauze sponges, not tissue or cotton balls. Peri-Wound Care Topical Primary Dressing epifix Discharge Instruction: applied by Phat Dalton adaptic and steri-strips Discharge Instruction: secured the epifix Secondary Dressing ABD Pad, 8x10 Discharge Instruction: Apply over primary dressing as directed. Woven Gauze Sponge, Non-Sterile 4x4 in Discharge Instruction: Apply over primary dressing as directed. Secured With [employer], 4.5x3.1 (in/yd) Discharge Instruction: Secure with Kerlix as directed. Compression Wrap Compression Stockings Add-Ons TCC size 4 Discharge Instruction: last layer applied by physician. Electronic Signature(s) Signed: 03/22/2023 5:39:10 PM By: Shawn Stall RN, BSN Entered By: Shawn Stall on 03/22/2023 12:56:52 -------------------------------------------------------------------------------- Vitals Details Patient Name: Date of Service: HUFFMA [name], [name]. 03/22/2023 12:30 PM Medical Record Number: [id number] Patient Account Number: [id number] Date of Birth/Sex: Treating RN: 18-May-1961 (61 y.o. F) Badami, Alaycia [name] ([id number]) [id number] Page 8 of 8 Primary Care Tekeisha Hakim: [name] Other Clinician: Referring Sylver Vantassell: Treating Kiira Brach/Extender: [name]4 Vital Signs Time Taken: 12:53 Temperature (F): 98.1 Height (in): 66 Pulse (bpm): 89 Weight (lbs): 339 Respiratory Rate (breaths/min): 18 Body Mass Index (BMI): 54.7 Blood Pressure (mmHg): 105/69 Capillary Blood Glucose (mg/dl): 010 Reference Range: 80  - 120 mg / dl Electronic Signature(s) Signed: 03/22/2023 4:27:49 PM By: Thayer Dallas Entered By: Thayer Dallas on 03/22/2023 12:54:24

## 2023-03-30 ENCOUNTER — Encounter (HOSPITAL_BASED_OUTPATIENT_CLINIC_OR_DEPARTMENT_OTHER): Payer: 59 | Admitting: Internal Medicine

## 2023-03-30 DIAGNOSIS — L97522 Non-pressure chronic ulcer of other part of left foot with fat layer exposed: Secondary | ICD-10-CM

## 2023-03-30 DIAGNOSIS — E11621 Type 2 diabetes mellitus with foot ulcer: Secondary | ICD-10-CM

## 2023-03-30 NOTE — Progress Notes (Signed)
[name], [name] ([id number]) [id number] Page 1 of 12 Visit Report for 03/30/2023 Chief Complaint Document Details Patient Name: Date of Service: [name] 03/30/2023 2:15 PM Medical Record Number: [id number] Patient Account Number: [id number] Date of Birth/Sex: Treating RN: January 31, 1961 (62 y.o. F) Primary Care Provider: [name] Other Clinician: Referring Provider: Treating Provider/Extender: [name] in Treatment: 09 Information Obtained from: Patient Chief Complaint 03/05/2022; left foot wound Electronic Signature(s) Signed: 03/30/2023 4:25:00 PM By: Geralyn Corwin DO Entered By: Geralyn Corwin on 03/30/2023 15:18:53 -------------------------------------------------------------------------------- Cellular or Tissue Based Product Details Patient Name: Date of Service: [name]. 03/30/2023 2:15 PM Medical Record Number: [id number] Patient Account Number: [id number] Date of Birth/Sex: Treating RN: 1961-02-07 (61 y.o. [name], [name] Primary Care Provider: [name] Other Clinician: Referring Provider: Treating Provider/Extender: [name]5 Cellular or Tissue Based Product Type Wound #2 Left,Distal Calcaneus Applied to: Performed By: Physician Geralyn Corwin, DO Cellular or Tissue Based Product Type: Epifix Level of Consciousness (Pre-procedure): Awake and Alert Pre-procedure Verification/Time Out Yes - 14:48 Taken: Location: genitalia / hands / feet / multiple digits Wound Size (sq cm): 0.45 Product Size (sq cm): 3 Waste Size (sq cm): 0 Amount of Product Applied (sq cm): 3 Instrument Used: Forceps, Scissors Lot #: [id number] Order #: [id number] Expiration Date: 10/06/2027 Fenestrated: No Reconstituted: Yes Solution Type: normal saline Solution Amount: 3mL Lot #: [id number] Solution Expiration Date: 03/04/2025 Secured: Yes Secured With:  Steri-Strips, adaptic Dressing Applied: No Procedural Pain: 0 Post Procedural Pain: 0 Response to Treatment: Procedure was tolerated well Level of Consciousness (Post- Awake and Alert procedure): Swift, [name] ([id number]) [id number] Page 2 of 12 Post Procedure Diagnosis Same as Pre-procedure Electronic Signature(s) Signed: 03/30/2023 4:25:00 PM By: Geralyn Corwin DO Signed: 03/30/2023 4:53:01 PM By: Shawn Stall RN, BSN Entered By: Shawn Stall on 03/30/2023 14:49:34 -------------------------------------------------------------------------------- Debridement Details Patient Name: Date of Service: [name]. 03/30/2023 2:15 PM Medical Record Number: [id number] Patient Account Number: [id number] Date of Birth/Sex: Treating RN: 1961/06/03 (61 y.o. [name], [name] Primary Care Provider: [name] Other Clinician: Referring Provider: Treating Provider/Extender: [name]5 Debridement Performed for Assessment: Wound #2 Left,Distal Calcaneus Performed By: Physician Geralyn Corwin, DO Debridement Type: Debridement Severity of Tissue Pre Debridement: Fat layer exposed Level of Consciousness (Pre-procedure): Awake and Alert Pre-procedure Verification/Time Out Yes - 14:45 Taken: Start Time: 14:46 Pain Control: Lidocaine 4% T opical Solution Percent of Wound Bed Debrided: 100% T Area Debrided (cm): otal 0.35 Tissue and other material debrided: Viable, Non-Viable, Skin: Dermis , Skin: Epidermis Level: Skin/Epidermis Debridement Description: Selective/Open Wound Instrument: Curette Bleeding: Minimum Hemostasis Achieved: Pressure End Time: 14:47 Procedural Pain: 0 Post Procedural Pain: 0 Response to Treatment: Procedure was tolerated well Level of Consciousness (Post- Awake and Alert procedure): Post Debridement Measurements of Total Wound Length: (cm) 0.5 Width: (cm) 0.9 Depth: (cm)  0.1 Volume: (cm) 0.035 Character of Wound/Ulcer Post Debridement: Improved Severity of Tissue Post Debridement: Fat layer exposed Post Procedure Diagnosis Same as Pre-procedure Electronic Signature(s) Signed: 03/30/2023 4:25:00 PM By: Geralyn Corwin DO Signed: 03/30/2023 4:53:01 PM By: Shawn Stall RN, BSN Entered By: Shawn Stall on 03/30/2023 14:48:06 HPI Details -------------------------------------------------------------------------------- [name] ([id number]) [id number] Page 3 of 12 Patient Name: Date of Service: [name] 03/30/2023 2:15 PM Medical Record Number: [id number] Patient Account Number: [id number] Date of Birth/Sex: Treating RN: 28-Dec-1960 (62 y.o. F) Primary Care Provider:  [name] Other Clinician: Referring Provider: Treating Provider/Extender: [name] in Treatment: 79 History of Present Illness HPI Description: Admission 03/05/2022 [name] is a 61 year old female with a past medical history of uncontrolled insulin-dependent type 2 diabetes, tobacco user and chronic diastolic heart failure that presents to the clinic for a 35-month history of wound to her left heel. She states she had a left ankle fusion in September 2022. She states that she always had a wound after the surgery and it never healed. She has home health and they have been doing compression wraps along with silver alginate to the wound bed. She currently denies signs of infection. 6/8; this is a 62 year old woman with type 2 diabetes. She developed a wound on her left Achilles heel just above the tip of the heel in the setting of recurrent ankle surgeries in late 2022. I have seen some of these results from either the cast or the surgical boots that are put on after these operations. She thinks this may be the case. And a sense of pressure ulcer. In any case that she has been using Santyl Hydrofera Blue under  compression. She is not wearing any footwear at home as she cannot find anything to accommodate the wrap 6/22; patient presents for follow-up. She has home health that comes out once a week to help with dressing changes. She has no issues or complaints today. We have been using Hydrofera Blue under compression therapy. 7/6; patient presents for follow-up. She states that home health did not come out for the past 2 weeks. It is unclear why. We have been using Hydrofera Blue and Santyl under compression therapy. 7/13; patient presents for follow-up. She did not take the oral antibiotics prescribed at last clinic visit. We have been using Hydrofera Blue with gentamicin/mupirocin ointment under compression therapy. She has no issues or complaints today. She denies signs of infection. 7/20; patient presents for follow-up. We have been using Hydrofera Blue with antibiotic ointment under 3 layer compression. She has home health that change the dressing once. They put collagen on the wound bed. She also reports falling yesterday and hitting her right foot. She has no pain to the area today. 7/27; patient presents for follow-up. We have been using Hydrofera Blue under 3 layer compression. She has home health that changes the dressing. She has no issues or complaints today. 8/3; patient presents for follow-up. We continues to use Hydrofera Blue under 3 layer compression. She has no issues or complaints today. She has been approved for Epicord at 100%. 8/11; patient presents for follow-up. We have been using Hydrofera Blue under 3 layer compression. She has been approved for Epicord and we have this today. She is in agreement with having this applied. 8/18; patient presents for follow-up. Epicord #1 was placed in standard fashion last clinic visit. She has no issues or complaints today. 9/18; Unfortunately patient has missed her last clinic appointments due to falling and breaking her right ankle. We have been  following her for her left ankle wound. She has also developed a large blister to the left heel over the past week that has ruptured and dried. She currently resides In [facility]. Wound9/25; patient presents for follow-up. We have been using Hydrofera Blue and Santyl to the original left ankle wound and Xeroform to the dried blistered. We have been wrapping her with compression therapy. She resides in a facility and they changed the wrap once this past week. She has no  issues or complaints today. She denies signs of infection. 10/3; patient presents for follow-up. We have been using Xeroform to the heel wound and Epicort to the left ankle wound All under compression therapy. She has no issues or complaints today. 10/10; patient presents for follow-up. We have been using Epicort to the left ankle wound and Santyl and Hydrofera Blue to the heel wound. All under compression therapy. she has no issues or complaints today. 10/16; patient presents for follow-up. Epicort was placed in standard fashion to the superior wound and onto the heel and Santyl and Hydrofera Blue. She states that the wrap got wet during her shower and her facility was able to rewrap with Kerlix/Coban. 10/23; patient presents for follow-up. Epicord was placed to the wound beds last clinic visit. We continue Kerlix/Coban. She has no issues or complaints today. 10/31; Patient presents for follow-up. Epicord was placed to the wound beds last clinic visit. The original wound is almost healed. We have done this under Kerlix/Coban. She denies signs of infection. 11/10; patient presents for follow-up. Patient's last epi cord was placed in standard fashion at last clinic visit. The original wound has healed. She still has a heel wound. Grafix was approved however too costly for the patient. We will rerun epi cord to see if she can have more applications. She had done very well with this. We have been using Hydrofera Blue to the heel  under Kerlix/Coban. 11/21; patient presents for follow-up. We have been using Hydrofera Blue and Santyl to the heel under Kerlix/Coban. She switched to a new healthcare insurance and again The skin substitute is too costly for the patient. She denies signs of infection. 11/28; patient presents for follow-up. Her facility did not change the wrap last clinic visit due to lack of staffing. The wrap was taken off and Hydrofera Blue dressing has been in place for the past week. She has no issues or complaints today. 12/5; patient presents for follow-up. She states she has had more drainage over the past week. They did not change the wrap at her facility. She states the nurse will be back this week. She is also been doing a lot of physical therapy. She has been using the Prevalon boot while in bed. 12/12; patient presents for follow-up. We have been using Hydrofera Blue with antibiotic ointment under 4-layer compression. She is tolerated the wrap well. She states she is using her Prevalon boot while in the wheelchair. She has no issues or complaints today. There is been improvement in wound healing. 12/19; patient presents for follow-up. We have been using Hydrofera Blue and antibiotic ointment to the wound bed under 4-layer compression. Her facility change the wrap once. She states she has been using the Prevalon boot in bed but not in the wheelchair due to issues with transferring. Overall there is still improvement in wound healing. 1/16; patient presents for follow-up. She missed her last clinic appointment. We have been using Hydrofera Blue and antibiotic ointment under 4-layer compression. At the facility this has only been changed twice over the past 3 weeks. They are not using 4-layer compression. She came in with Kerlix/Coban. 1/23; patient presents for follow-up. We have been using Santyl and Hydrofera Blue under 4-layer compression. Patient has no issues or complaints today. [name], [name]  ([id number]) [id number] Page 4 of 12 2/1; patient presents for follow-up. Patient's been using Santyl and Hydrofera Blue to the wound bed. She has developed more slough and now a mild odor. She states she  is wearing the Prevalon boot at night. It is unclear if she is offloading this area during the day. 2/13; patient presents for follow-up. Patient's been using Dakin's wet-to-dry dressings. She received her Keystone antibiotic ointment in the mail. She has not used this yet. She reports using her Prevalon boot. She has no issues or complaints today. 2/20; patient presents for follow-up. She has been using Keystone antibiotic ointment with Hydrofera Blue. She has no issues or complaints today. Due to transportation she cannot do the total contact cast today. She is scheduled for next week to have this placed. 2/27; patient presents for follow-up. Plan is for the total contact cast today. We have been using Keystone and Hydrofera Blue to the wound bed. She forgot her Keystone antibiotic ointment. She has no issues or complaints today. 2/29; patient presents for follow-up. Patient presents for her obligatory cast change. She has no issues or complaints today. 3/5; patient presents for follow-up. She has had the cast in place for the past week and has tolerated this well. She has no issues or complaints today. We have been using PolyMem with Keystone antibiotic ointment. 12/15/2022: The wound is quite clean and measured smaller today. 3/19; we have been using Keystone antibiotic ointment with PolyMem silver under the total contact cast. Overall there is been improvement in wound healing. 3/26; patient presents for follow-up. She is been approved for epi fix. She would like to proceed with this today. Previously we have been using Keystone antibiotic ointment with PolyMem silver under the total contact cast. She has no issues or complaints. 01/05/2023: The wound is clean and she  is tolerating total contact casting without difficulty. She had her first application of EpiFix last week. 4/9; patient presents for follow-up. We have been applying EpiFix under the total contact cast for the past 2 weeks. She is tolerated this well. She has no issues or complaints today. 4/16; patient presents for follow-up. We have been using EpiFix under the total contact cast. She has done well with this. Wound is smaller. She does have slough buildup. 4/23; patient presents for follow-up. We have been using EpiFix under the total contact cast. Wound appears well-healing. She does have slough buildup. 4/30; patient presents for follow-up. We have been using EpiFix under the total contact cast. Wound is smaller. She again has slough buildup. 5/6; patient with a left heel ulcer type II diabetic. We applied EpiFix #7 under a total contact cast. Her wound is making good progress. 5/14; patient presents for follow-up. We have been using EpiFix under the total contact casting wound is smaller. 5/21; patient presents for follow-up. We have been using EpiFix under the total contact cast. The wound is smaller. She has no issues or complaints today. 5/28; patient presents for follow-up. We have been using EpiFix under the total contact cast. Wound is smaller. 6/11; patient presents for follow-up. We have been using EpiFix under the total contact cast. Wound is stable. 6/17; patient presents for follow-up. We have been using EpiFix under the total contact cast. Wound is smaller. 6/25; patient presents for follow-up. We have been using EpiFix under the total contact cast. Wound continues to improve in size and is much smaller. Electronic Signature(s) Signed: 03/30/2023 4:25:00 PM By: Geralyn Corwin DO Entered By: Geralyn Corwin on 03/30/2023 15:19:22 -------------------------------------------------------------------------------- Physical Exam Details Patient Name: Date of Service: [name].  03/30/2023 2:15 PM Medical Record Number: [id number] Patient Account Number: [id number] Date of Birth/Sex: Treating RN: Apr 28, 1961 (61  y.o. F) Primary Care Provider: [name] Other Clinician: Referring Provider: Treating Provider/Extender: [name]5 Constitutional respirations regular, non-labored and within target range for patient.. Cardiovascular 2+ dorsalis pedis/posterior tibialis pulses. Psychiatric pleasant and cooperative. Notes Open wound with granulation tissue and nonviable tissue at the heel. No signs of surrounding infection. [name], [name] ([id number]) [id number] Page 5 of 12 Electronic Signature(s) Signed: 03/30/2023 4:25:00 PM By: Geralyn Corwin DO Entered By: Geralyn Corwin on 03/30/2023 15:20:56 -------------------------------------------------------------------------------- Physician Orders Details Patient Name: Date of Service: [name]. 03/30/2023 2:15 PM Medical Record Number: [id number] Patient Account Number: [id number] Date of Birth/Sex: Treating RN: September 13, 1961 (61 y.o. [name], [name] Primary Care Provider: [name] Other Clinician: Referring Provider: Treating Provider/Extender: [name]6 Verbal / Phone Orders: No Diagnosis Coding ICD-10 Coding Code Description [phone] Non-pressure chronic ulcer of other part of left foot with fat layer exposed E11.621 Type 2 diabetes mellitus with foot ulcer E66.01 Morbid (severe) obesity due to excess calories Follow-up Appointments ppointment in 1 week. - Dr. Mikey Bussing 04/05/2023 0800 Monday Return A ppointment in 2 weeks. - 04/13/2023 Dr. Mikey Bussing 0800 Return A Other: - Leave dressing in place. Facility not to change dressing. Anesthetic (In clinic) Topical Lidocaine 5% applied to wound bed (In clinic) Topical Lidocaine 4% applied to wound bed Cellular or Tissue  Based Products Cellular or Tissue Based Product Type: - Run IVR for EpiFix and Epicord=100% covered 05/07/22: Epicord ordered 12/08/2022: Run insurance for epicord and theraskin. 01/19/2023: Run for organogenesis- puraply AM daptic or Mepitel. (DO NOT REMOVE). - Cellular or Tissue Based Product applied to wound bed, secured with steri-strips, cover with A Epicord #1 05/15/25 Epicord #2 05/22/22 Epicord #3 06/29/2022 Epicord #4 07/07/2022 Epifix #5 07/14/22 Epicord #6 07/20/22 Epicord # 7 07/27/22 ***Run for Grafix***PENDING Epicord #8 08/04/2022 2024-IVR epifix approved 12/29/2022 #1 epifix applied to left heel. 01/05/2023 #2 epifix applied to left heel. 01/12/2023 #3 epifix applied to left heel 01/19/2023 #4 epifix applied to left heel. 01/26/23 Epifix #5 02/08/2023 #7 epifix 02/16/2023 #8 epifix 02/23/2023 #9 epifix 03/02/23 #10 epifix 03/09/2023 #11 epifix 18mm disk 03/16/2023 #12 epifix 18mm Disk 03/22/2023 #13 epifix 18mm DIsk 03/30/2023 #14 epifix 18mm Disk Bathing/ Shower/ Hygiene May shower with protection but do not get wound dressing(s) wet. Protect dressing(s) with water repellant cover (for example, large plastic bag) or a cast cover and may then take shower. Edema Control - Lymphedema / SCD / Other Elevate legs to the level of the heart or above for 30 minutes daily and/or when sitting for 3-4 times a day throughout the day. Avoid standing for long periods of time. Off-Loading Elsen, Breta [name] ([id number]) [id number] Page 6 of 12 Total Contact Cast to Left Lower Extremity - size 4 Additional Orders / Instructions Follow Nutritious Diet - Monitor/Control Blood Sugar Wound Treatment Wound #2 - Calcaneus Wound Laterality: Left, Distal Cleanser: Soap and Water 1 x Per Week/30 Days Discharge Instructions: May shower and wash wound with dial antibacterial soap and water prior to dressing change. Cleanser: Wound Cleanser 1 x Per Week/30 Days Discharge Instructions:  Cleanse the wound with wound cleanser prior to applying a clean dressing using gauze sponges, not tissue or cotton balls. Prim Dressing: epifix 1 x Per Week/30 Days ary Discharge Instructions: applied by provider Prim Dressing: adaptic and steri-strips 1 x Per Week/30 Days ary Discharge Instructions: secured the epifix Secondary Dressing: ABD Pad, 8x10 1 x Per Week/30 Days Discharge  Instructions: Apply over primary dressing as directed. Secondary Dressing: Woven Gauze Sponge, Non-Sterile 4x4 in (Home Health) 1 x Per Week/30 Days Discharge Instructions: Apply over primary dressing as directed. Secured With: [employer], 4.5x3.1 (in/yd) 1 x Per Week/30 Days Discharge Instructions: Secure with Kerlix as directed. Add-Ons: TCC size 4 1 x Per Week/30 Days Discharge Instructions: last layer applied by physician. Electronic Signature(s) Signed: 03/30/2023 4:25:00 PM By: Geralyn Corwin DO Entered By: Geralyn Corwin on 03/30/2023 15:21:04 -------------------------------------------------------------------------------- Problem List Details Patient Name: Date of Service: [name]. 03/30/2023 2:15 PM Medical Record Number: [id number] Patient Account Number: [id number] Date of Birth/Sex: Treating RN: 1961/04/01 (61 y.o. [name], [name] Primary Care Provider: [name] Other Clinician: Referring Provider: Treating Provider/Extender: [name]1 Active Problems ICD-10 Encounter Code Description Active Date MDM Diagnosis [phone] Non-pressure chronic ulcer of other part of left foot with fat layer exposed 03/05/2022 No Yes E11.621 Type 2 diabetes mellitus with foot ulcer 03/05/2022 No Yes E66.01 Morbid (severe) obesity due to excess calories 03/05/2022 No Yes Inactive Problems Resolved Problems January, Ilean [name] ([id number]) [id number] Page 7 of 12 Electronic Signature(s) Signed: 03/30/2023 4:25:00 PM  By: Geralyn Corwin DO Entered By: Geralyn Corwin on 03/30/2023 15:18:37 -------------------------------------------------------------------------------- Progress Note Details Patient Name: Date of Service: [name]. 03/30/2023 2:15 PM Medical Record Number: [id number] Patient Account Number: [id number] Date of Birth/Sex: Treating RN: 1960-10-10 (62 y.o. F) Primary Care Provider: [name] Other Clinician: Referring Provider: Treating Provider/Extender: [name]5 Subjective Chief Complaint Information obtained from Patient 03/05/2022; left foot wound History of Present Illness (HPI) Admission 03/05/2022 Ms. Chrystie Doffing is a 62 year old female with a past medical history of uncontrolled insulin-dependent type 2 diabetes, tobacco user and chronic diastolic heart failure that presents to the clinic for a 64-month history of wound to her left heel. She states she had a left ankle fusion in September 2022. She states that she always had a wound after the surgery and it never healed. She has home health and they have been doing compression wraps along with silver alginate to the wound bed. She currently denies signs of infection. 6/8; this is a 62 year old woman with type 2 diabetes. She developed a wound on her left Achilles heel just above the tip of the heel in the setting of recurrent ankle surgeries in late 2022. I have seen some of these results from either the cast or the surgical boots that are put on after these operations. She thinks this may be the case. And a sense of pressure ulcer. In any case that she has been using Santyl Hydrofera Blue under compression. She is not wearing any footwear at home as she cannot find anything to accommodate the wrap 6/22; patient presents for follow-up. She has home health that comes out once a week to help with dressing changes. She has no issues or complaints today. We have been using  Hydrofera Blue under compression therapy. 7/6; patient presents for follow-up. She states that home health did not come out for the past 2 weeks. It is unclear why. We have been using Hydrofera Blue and Santyl under compression therapy. 7/13; patient presents for follow-up. She did not take the oral antibiotics prescribed at last clinic visit. We have been using Hydrofera Blue with gentamicin/mupirocin ointment under compression therapy. She has no issues or complaints today. She denies signs of infection. 7/20; patient presents for follow-up. We have been  using Hydrofera Blue with antibiotic ointment under 3 layer compression. She has home health that change the dressing once. They put collagen on the wound bed. She also reports falling yesterday and hitting her right foot. She has no pain to the area today. 7/27; patient presents for follow-up. We have been using Hydrofera Blue under 3 layer compression. She has home health that changes the dressing. She has no issues or complaints today. 8/3; patient presents for follow-up. We continues to use Hydrofera Blue under 3 layer compression. She has no issues or complaints today. She has been approved for Epicord at 100%. 8/11; patient presents for follow-up. We have been using Hydrofera Blue under 3 layer compression. She has been approved for Epicord and we have this today. She is in agreement with having this applied. 8/18; patient presents for follow-up. Epicord #1 was placed in standard fashion last clinic visit. She has no issues or complaints today. 9/18; Unfortunately patient has missed her last clinic appointments due to falling and breaking her right ankle. We have been following her for her left ankle wound. She has also developed a large blister to the left heel over the past week that has ruptured and dried. She currently resides In [facility] Wound9/25; patient presents for follow-up. We have been using Hydrofera Blue and Santyl to the  original left ankle wound and Xeroform to the dried blistered. We have been wrapping her with compression therapy. She resides in a facility and they changed the wrap once this past week. She has no issues or complaints today. She denies signs of infection. 10/3; patient presents for follow-up. We have been using Xeroform to the heel wound and Epicort to the left ankle wound All under compression therapy. She has no issues or complaints today. 10/10; patient presents for follow-up. We have been using Epicort to the left ankle wound and Santyl and Hydrofera Blue to the heel wound. All under compression therapy. she has no issues or complaints today. 10/16; patient presents for follow-up. Epicort was placed in standard fashion to the superior wound and onto the heel and Santyl and Hydrofera Blue. She states that the wrap got wet during her shower and her facility was able to rewrap with Kerlix/Coban. 10/23; patient presents for follow-up. Epicord was placed to the wound beds last clinic visit. We continue Kerlix/Coban. She has no issues or complaints today. 10/31; Patient presents for follow-up. Epicord was placed to the wound beds last clinic visit. The original wound is almost healed. We have done this under Kerlix/Coban. She denies signs of infection. [name], [name] ([id number]) [id number] Page 8 of 12 11/10; patient presents for follow-up. Patient's last epi cord was placed in standard fashion at last clinic visit. The original wound has healed. She still has a heel wound. Grafix was approved however too costly for the patient. We will rerun epi cord to see if she can have more applications. She had done very well with this. We have been using Hydrofera Blue to the heel under Kerlix/Coban. 11/21; patient presents for follow-up. We have been using Hydrofera Blue and Santyl to the heel under Kerlix/Coban. She switched to a new healthcare insurance and again The skin  substitute is too costly for the patient. She denies signs of infection. 11/28; patient presents for follow-up. Her facility did not change the wrap last clinic visit due to lack of staffing. The wrap was taken off and Hydrofera Blue dressing has been in place for the past week. She has no  issues or complaints today. 12/5; patient presents for follow-up. She states she has had more drainage over the past week. They did not change the wrap at her facility. She states the nurse will be back this week. She is also been doing a lot of physical therapy. She has been using the Prevalon boot while in bed. 12/12; patient presents for follow-up. We have been using Hydrofera Blue with antibiotic ointment under 4-layer compression. She is tolerated the wrap well. She states she is using her Prevalon boot while in the wheelchair. She has no issues or complaints today. There is been improvement in wound healing. 12/19; patient presents for follow-up. We have been using Hydrofera Blue and antibiotic ointment to the wound bed under 4-layer compression. Her facility change the wrap once. She states she has been using the Prevalon boot in bed but not in the wheelchair due to issues with transferring. Overall there is still improvement in wound healing. 1/16; patient presents for follow-up. She missed her last clinic appointment. We have been using Hydrofera Blue and antibiotic ointment under 4-layer compression. At the facility this has only been changed twice over the past 3 weeks. They are not using 4-layer compression. She came in with Kerlix/Coban. 1/23; patient presents for follow-up. We have been using Santyl and Hydrofera Blue under 4-layer compression. Patient has no issues or complaints today. 2/1; patient presents for follow-up. Patient's been using Santyl and Hydrofera Blue to the wound bed. She has developed more slough and now a mild odor. She states she is wearing the Prevalon boot at night. It is unclear  if she is offloading this area during the day. 2/13; patient presents for follow-up. Patient's been using Dakin's wet-to-dry dressings. She received her Keystone antibiotic ointment in the mail. She has not used this yet. She reports using her Prevalon boot. She has no issues or complaints today. 2/20; patient presents for follow-up. She has been using Keystone antibiotic ointment with Hydrofera Blue. She has no issues or complaints today. Due to transportation she cannot do the total contact cast today. She is scheduled for next week to have this placed. 2/27; patient presents for follow-up. Plan is for the total contact cast today. We have been using Keystone and Hydrofera Blue to the wound bed. She forgot her Keystone antibiotic ointment. She has no issues or complaints today. 2/29; patient presents for follow-up. Patient presents for her obligatory cast change. She has no issues or complaints today. 3/5; patient presents for follow-up. She has had the cast in place for the past week and has tolerated this well. She has no issues or complaints today. We have been using PolyMem with Keystone antibiotic ointment. 12/15/2022: The wound is quite clean and measured smaller today. 3/19; we have been using Keystone antibiotic ointment with PolyMem silver under the total contact cast. Overall there is been improvement in wound healing. 3/26; patient presents for follow-up. She is been approved for epi fix. She would like to proceed with this today. Previously we have been using Keystone antibiotic ointment with PolyMem silver under the total contact cast. She has no issues or complaints. 01/05/2023: The wound is clean and she is tolerating total contact casting without difficulty. She had her first application of EpiFix last week. 4/9; patient presents for follow-up. We have been applying EpiFix under the total contact cast for the past 2 weeks. She is tolerated this well. She has no issues or complaints  today. 4/16; patient presents for follow-up. We have been using  EpiFix under the total contact cast. She has done well with this. Wound is smaller. She does have slough buildup. 4/23; patient presents for follow-up. We have been using EpiFix under the total contact cast. Wound appears well-healing. She does have slough buildup. 4/30; patient presents for follow-up. We have been using EpiFix under the total contact cast. Wound is smaller. She again has slough buildup. 5/6; patient with a left heel ulcer type II diabetic. We applied EpiFix #7 under a total contact cast. Her wound is making good progress. 5/14; patient presents for follow-up. We have been using EpiFix under the total contact casting wound is smaller. 5/21; patient presents for follow-up. We have been using EpiFix under the total contact cast. The wound is smaller. She has no issues or complaints today. 5/28; patient presents for follow-up. We have been using EpiFix under the total contact cast. Wound is smaller. 6/11; patient presents for follow-up. We have been using EpiFix under the total contact cast. Wound is stable. 6/17; patient presents for follow-up. We have been using EpiFix under the total contact cast. Wound is smaller. 6/25; patient presents for follow-up. We have been using EpiFix under the total contact cast. Wound continues to improve in size and is much smaller. Patient History Information obtained from Patient, Chart. Family History Cancer - Father,Mother, Diabetes - Mother,Maternal Grandparents,Paternal Grandparents, Heart Disease - Siblings,Maternal Grandparents, Hypertension - Siblings, Tuberculosis - Maternal Grandparents,Paternal Grandparents. Social History Current every day smoker, Marital Status - Single, Alcohol Use - Never, Drug Use - No History, Caffeine Use - Daily. Medical History Hematologic/Lymphatic Patient has history of Anemia Respiratory Patient has history of Chronic Obstructive Pulmonary  Disease (COPD) Cardiovascular [name], [name] ([id number]) [id number] Page 9 of 12 Patient has history of Congestive Heart Failure - Weighs self daily, Hypertension, Myocardial Infarction, Peripheral Venous Disease Endocrine Patient has history of Type II Diabetes Neurologic Patient has history of Neuropathy Medical A Surgical History Notes nd Gastrointestinal GERD, Peptic Ulcer Disease Musculoskeletal Broke left leg 3 years ago, 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 Vitals Time Taken: 2:15 PM, Height: 66 in, Weight: 339 lbs, BMI: 54.7, Temperature: 98.5 F, Pulse: 82 bpm, Respiratory Rate: 18 breaths/min, Blood Pressure: 133/72 mmHg, Capillary Blood Glucose: 174 mg/dl. Cardiovascular 2+ dorsalis pedis/posterior tibialis pulses. Psychiatric pleasant and cooperative. General Notes: Open wound with granulation tissue and nonviable tissue at the heel. No signs of surrounding infection. Integumentary (Hair, Skin) Wound #2 status is Open. Original cause of wound was Blister. The date acquired was: 06/15/2022. The wound has been in treatment 39 weeks. The wound is located on the Left,Distal Calcaneus. The wound measures 0.5cm length x 0.9cm width x 0.1cm depth; 0.353cm^2 area and 0.035cm^3 volume. There is Fat Layer (Subcutaneous Tissue) exposed. There is no tunneling or undermining noted. There is a medium amount of serosanguineous drainage noted. The wound margin is distinct with the outline attached to the wound base. There is large (67-100%) red, pink granulation within the wound bed. There is a small (1-33%) amount of necrotic tissue within the wound bed including Adherent Slough. The periwound skin appearance had no abnormalities noted for texture. The periwound skin appearance had no abnormalities noted for color. The periwound skin appearance  did not exhibit: Dry/Scaly, Maceration. Periwound temperature was noted as No Abnormality. Assessment Active Problems ICD-10 Non-pressure chronic ulcer of other part of left foot with fat layer exposed Type 2 diabetes mellitus with foot ulcer Morbid (severe)  obesity due to excess calories Patient's wound has shown improvement in size and appearance in the last clinic visit. I debrided nonviable tissue. EpiFix was placed in standard fashion. The total contact cast was placed in standard fashion. Follow-up in 1 week. 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 There was a Selective/Open Wound Skin/Epidermis Debridement with a total area of 0.35 sq cm performed by Geralyn Corwin, DO. With the following instrument(s): Curette to remove Viable and Non-Viable tissue/material. Material removed includes Skin: Dermis and Skin: Epidermis and after achieving pain control using Lidocaine 4% Topical Solution. A time out was conducted at 14:45, prior to the start of the procedure. A Minimum amount of bleeding was controlled with Pressure. The procedure was tolerated well with a pain level of 0 throughout and a pain level of 0 following the procedure. Post Debridement Measurements: 0.5cm length x 0.9cm width x 0.1cm depth; 0.035cm^3 volume. Character of Wound/Ulcer Post Debridement is improved. Severity of Tissue Post Debridement is: Fat layer exposed. Post procedure Diagnosis Wound #2: Same as Pre-Procedure Pre-procedure diagnosis of Wound #2 is a Diabetic Wound/Ulcer of the Lower Extremity located on the Left,Distal Calcaneus. A skin graft procedure using a bioengineered skin substitute/cellular or tissue based product was performed by Geralyn Corwin, DO with the following instrument(s): Forceps and Scissors. Epifix was applied and secured with  Steri-Strips and adaptic. 3 sq cm of product was utilized and 0 sq cm was wasted. Post Application, no dressing was [name], [name] ([id number]) [phone].pdf Page 10 of 12 applied. A Time Out was conducted at 14:48, prior to the start of the procedure. The procedure was tolerated well with a pain level of 0 throughout and a pain level of 0 following the procedure. Post procedure Diagnosis Wound #2: Same as Pre-Procedure . Plan Follow-up Appointments: Return Appointment in 1 week. - Dr. Mikey Bussing 04/05/2023 0800 Monday Return Appointment in 2 weeks. - 04/13/2023 Dr. Mikey Bussing 0800 Other: - Leave dressing in place. Facility not to change dressing. Anesthetic: (In clinic) Topical Lidocaine 5% applied to wound bed (In clinic) Topical Lidocaine 4% applied to wound bed Cellular or Tissue Based Products: Cellular or Tissue Based Product Type: - Run IVR for EpiFix and Epicord=100% covered 05/07/22: Epicord ordered 12/08/2022: Run insurance for epicord and theraskin. 01/19/2023: Run for organogenesis- puraply AM Cellular or Tissue Based Product applied to wound bed, secured with steri-strips, cover with Adaptic or Mepitel. (DO NOT REMOVE). - Epicord #1 05/15/25 Epicord #2 05/22/22 Epicord #3 06/29/2022 Epicord #4 07/07/2022 Epifix #5 07/14/22 Epicord #6 07/20/22 Epicord # 7 07/27/22 ***Run for Grafix***PENDING Epicord #8 08/04/2022 2024-IVR epifix approved 12/29/2022 #1 epifix applied to left heel. 01/05/2023 #2 epifix applied to left heel. 01/12/2023 #3 epifix applied to left heel 01/19/2023 #4 epifix applied to left heel. 01/26/23 Epifix #5 02/08/2023 #7 epifix 02/16/2023 #8 epifix 02/23/2023 #9 epifix 03/02/23 #10 epifix 03/09/2023 #11 epifix 18mm disk 03/16/2023 #12 epifix 18mm Disk 03/22/2023 #13 epifix 18mm DIsk 03/30/2023 #14 epifix 18mm Disk Bathing/ Shower/ Hygiene: May shower with protection but do not get wound dressing(s) wet. Protect dressing(s) with water repellant cover (for example, large  plastic bag) or a cast cover and may then take shower. Edema Control - Lymphedema / SCD / Other: Elevate legs to the level of the heart or above for 30 minutes daily and/or when sitting for 3-4 times a day throughout the day.  Avoid standing for long periods of time. Off-Loading: T Contact Cast to Left Lower Extremity - size 4 otal Additional Orders / Instructions: Follow Nutritious Diet - Monitor/Control Blood Sugar WOUND #2: - Calcaneus Wound Laterality: Left, Distal Cleanser: Soap and Water 1 x Per Week/30 Days Discharge Instructions: May shower and wash wound with dial antibacterial soap and water prior to dressing change. Cleanser: Wound Cleanser 1 x Per Week/30 Days Discharge Instructions: Cleanse the wound with wound cleanser prior to applying a clean dressing using gauze sponges, not tissue or cotton balls. Prim Dressing: epifix 1 x Per Week/30 Days ary Discharge Instructions: applied by provider Prim Dressing: adaptic and steri-strips 1 x Per Week/30 Days ary Discharge Instructions: secured the epifix Secondary Dressing: ABD Pad, 8x10 1 x Per Week/30 Days Discharge Instructions: Apply over primary dressing as directed. Secondary Dressing: Woven Gauze Sponge, Non-Sterile 4x4 in (Home Health) 1 x Per Week/30 Days Discharge Instructions: Apply over primary dressing as directed. Secured With: [employer], 4.5x3.1 (in/yd) 1 x Per Week/30 Days Discharge Instructions: Secure with Kerlix as directed. Add-Ons: TCC size 4 1 x Per Week/30 Days Discharge Instructions: last layer applied by physician. 1. In office sharp debridement 2. Epi fix placed in standard fashion 3. T contact cast placed in standard fashion otal 4. Follow-up in 1 week Electronic Signature(s) Signed: 03/30/2023 4:25:00 PM By: Geralyn Corwin DO Entered By: Geralyn Corwin on 03/30/2023 15:22:09 -------------------------------------------------------------------------------- HxROS Details Patient Name:  Date of Service: [name]. 03/30/2023 2:15 PM Medical Record Number: [id number] Patient Account Number: [id number] Date of Birth/Sex: Treating RN: June 04, 1961 (62 y.o. F) Primary Care Provider: [name] Other Clinician: Referring Provider: Treating Provider/Extender: [name] in Treatment: [address] Claudetta [name] ([id number]) [id number] Page 11 of 12 Information Obtained From Patient Chart Hematologic/Lymphatic Medical History: Positive for: Anemia Respiratory Medical History: Positive for: Chronic Obstructive Pulmonary Disease (COPD) Cardiovascular Medical History: Positive for: Congestive Heart Failure - Weighs self daily; Hypertension; Myocardial Infarction; Peripheral Venous Disease Gastrointestinal Medical History: Past Medical History Notes: GERD, Peptic Ulcer Disease Endocrine Medical History: Positive for: Type II Diabetes Time with diabetes: 16 years Treated with: Insulin, Oral agents Blood sugar tested every day: Yes Tested : Twice daily Musculoskeletal Medical History: Past Medical History Notes: Broke left leg 3 years ago, 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, foot fused Neurologic Medical History: Positive for: Neuropathy Psychiatric Medical History: Past Medical History Notes: Depression Immunizations Pneumococcal Vaccine: Received Pneumococcal Vaccination: No Implantable Devices None Family and Social History Cancer: Yes - Father,Mother; Diabetes: Yes - Mother,Maternal Grandparents,Paternal Grandparents; Heart Disease: Yes - Siblings,Maternal Grandparents; Hypertension: Yes - Siblings; Tuberculosis: Yes - Maternal Grandparents,Paternal Grandparents; Current every day smoker; Marital Status - Single; Alcohol Use: Never; Drug Use: No History; Caffeine Use: Daily; Financial Concerns: No; Food, Clothing or Shelter Needs: No; Support System Lacking: No; Transportation Concerns: No Electronic  Signature(s) Signed: 03/30/2023 4:25:00 PM By: Geralyn Corwin DO Entered By: Geralyn Corwin on 03/30/2023 15:19:28 SuperBill Details -------------------------------------------------------------------------------- [name], [name] ([id number]) [id number] Page 12 of 12 Patient Name: Date of Service: [name] 03/30/2023 Medical Record Number: [id number] Patient Account Number: [id number] Date of Birth/Sex: Treating RN: 1961/09/22 (62 y.o. [name] Primary Care Provider: [name] Other Clinician: Referring Provider: Treating Provider/Extender: [name]5 Diagnosis Coding ICD-10 Codes Code Description 8082891405 Non-pressure chronic ulcer of other part of left foot with fat layer exposed E11.621 Type 2 diabetes mellitus with foot ulcer E66.01 Morbid (severe) obesity  due to excess calories Facility Procedures CPT4 Code Description Modifier Quantity [id number] Epifix 18mm disk qty 3 3 [id number] [id number] - SKIN SUB GRAFT FACE/NK/HF/G 1 ICD-10 Diagnosis Description L97.522 Non-pressure chronic ulcer of other part of left foot with fat layer exposed E11.621 Type 2 diabetes mellitus with foot ulcer Physician Procedures Quantity CPT4 Code Description Modifier [id number] 15275 - WC PHYS SKIN SUB GRAFT FACE/NK/HF/G 1 ICD-10 Diagnosis Description L97.522 Non-pressure chronic ulcer of other part of left foot with fat layer exposed E11.621 Type 2 diabetes mellitus with foot ulcer Electronic Signature(s) Signed: 03/30/2023 4:25:00 PM By: Geralyn Corwin DO Entered By: Geralyn Corwin on 03/30/2023 15:22:23

## 2023-04-02 NOTE — Progress Notes (Signed)
[name] ([id number]) [id number] Page 1 of 8 Visit Report for 03/30/2023 Arrival Information Details Patient Name: Date of Service: [name] 03/30/2023 2:15 PM Medical Record Number: [id number] Patient Account Number: [id number] Date of Birth/Sex: Treating RN: 1961-05-31 (62 y.o. F) Primary Care [name]: [name] Other Clinician: Referring [name]: Treating [name]/Extender: [name] in Treatment: 55 Visit Information History Since Last Visit Added or deleted any medications: No Patient Arrived: Wheel Chair Any new allergies or adverse reactions: No Arrival Time: 14:11 Had a fall or experienced change in No Accompanied By: self activities of daily living that may affect Transfer Assistance: None risk of falls: Patient Identification Verified: Yes Signs or symptoms of abuse/neglect since last No Secondary Verification Process Completed: Yes visito Patient Requires Transmission-Based Precautions: No Hospitalized since last visit: No Patient Has Alerts: No Implantable device outside of the clinic No excluding cellular tissue based products placed in the center since last visit: Has Dressing in Place as Prescribed: Yes Has Footwear/Offloading in Place as Yes Prescribed: Left: Removable Cast Walker/Walking Boot T Contact Cast otal Pain Present Now: Yes Electronic Signature(s) Signed: 04/02/2023 11:18:15 AM By: [name] Entered By: [name] on 03/30/2023 14:15:23 -------------------------------------------------------------------------------- Encounter Discharge Information Details Patient Name: Date of Service: [name]. 03/30/2023 2:15 PM Medical Record Number: [id number] Patient Account Number: [id number] Date of Birth/Sex: Treating RN: October 16, 1960 (61 y.o. [name] Primary Care Dakoda Laventure: [name] Other Clinician: Referring Deondrae Mcgrail: Treating  [name]/Extender: [name] in Treatment: 68 Encounter Discharge Information Items Post Procedure Vitals Discharge Condition: Stable Temperature (F): 98.5 Ambulatory Status: Wheelchair Pulse (bpm): 82 Discharge Destination: Skilled Nursing Facility Respiratory Rate (breaths/min): 18 Telephoned: No Blood Pressure (mmHg): 133/72 Orders Sent: Yes Transportation: Private Auto Accompanied By: self Schedule Follow-up Appointment: Yes Clinical Summary of Care: Electronic Signature(s) Signed: 03/30/2023 4:53:01 PM By: [name] Stall RN, BSN [name], [name] ([id number]) [id number] Page 2 of 8 Entered By: [name] on 03/30/2023 14:52:34 -------------------------------------------------------------------------------- Lower Extremity Assessment Details Patient Name: Date of Service: [name] 03/30/2023 2:15 PM Medical Record Number: [id number] Patient Account Number: [id number] Date of Birth/Sex: Treating RN: 1960/11/21 (62 y.o. F) Primary Care Oliviagrace Crisanti: [name] Other Clinician: Referring [name]: Treating [name]: [name] in Treatment: 55 Edema Assessment Assessed: [name]: No] [name]: No] Edema: [Left: Ye] [Right: s] Calf Left: Right: Point of Measurement: 28 cm From Medial Instep 48 cm Ankle Left: Right: Point of Measurement: 3 cm From Medial Instep 30.5 cm Vascular Assessment Pulses: Dorsalis Pedis Palpable: [Left:Yes] Electronic Signature(s) Signed: 04/02/2023 11:18:15 AM By: [name] Entered By: [name] on 03/30/2023 14:38:21 -------------------------------------------------------------------------------- Multi Wound Chart Details Patient Name: Date of Service: [name]. 03/30/2023 2:15 PM Medical Record Number: [id number] Patient Account Number: [id number] Date of Birth/Sex: Treating RN: Feb 21, 1961 (61 y.o. F) Primary Care  Niasha Devins: [name] Other Clinician: Referring [name]: Treating [name]/Extender: [name] in Treatment: 55 Vital Signs Height(in): 66 Capillary Blood Glucose(mg/dl): 623 Weight(lbs): 762 Pulse(bpm): 82 Body Mass Index(BMI): 54.7 Blood Pressure(mmHg): 133/72 Temperature(F): 98.5 Respiratory Rate(breaths/min): 18 [2:Photos:] [N/A:N/A] Left, Distal Calcaneus N/A N/A Wound Location: Blister N/A N/A Wounding Event: Diabetic Wound/Ulcer of the Lower N/A N/A Primary Etiology: Extremity Anemia, Chronic Obstructive N/A N/A Comorbid History: Pulmonary Disease (COPD), Congestive Heart Failure, Hypertension, Myocardial Infarction, Peripheral Venous Disease, Type II Diabetes, Neuropathy 06/15/2022 N/A N/A Date Acquired: 51 N/A N/A Weeks of Treatment: Open N/A N/A Wound  Status: No N/A N/A Wound Recurrence: 0.5x0.9x0.1 N/A N/A Measurements L x W x D (cm) 0.353 N/A N/A A (cm) : rea 0.035 N/A N/A Volume (cm) : 97.50% N/A N/A % Reduction in A rea: 97.50% N/A N/A % Reduction in Volume: Grade 2 N/A N/A Classification: Medium N/A N/A Exudate A mount: Serosanguineous N/A N/A Exudate Type: red, brown N/A N/A Exudate Color: Distinct, outline attached N/A N/A Wound Margin: Large (67-100%) N/A N/A Granulation A mount: Red, Pink N/A N/A Granulation Quality: Small (1-33%) N/A N/A Necrotic A mount: Fat Layer (Subcutaneous Tissue): Yes N/A N/A Exposed Structures: Fascia: No Tendon: No Muscle: No Joint: No Bone: No Medium (34-66%) N/A N/A Epithelialization: Debridement - Selective/Open Wound N/A N/A Debridement: Pre-procedure Verification/Time Out 14:45 N/A N/A Taken: Lidocaine 4% Topical Solution N/A N/A Pain Control: Skin/Epidermis N/A N/A Level: 0.35 N/A N/A Debridement A (sq cm): rea Curette N/A N/A Instrument: Minimum N/A N/A Bleeding: Pressure N/A N/A Hemostasis A chieved: 0 N/A N/A Procedural Pain: 0 N/A  N/A Post Procedural Pain: Procedure was tolerated well N/A N/A Debridement Treatment Response: 0.5x0.9x0.1 N/A N/A Post Debridement Measurements L x W x D (cm) 0.035 N/A N/A Post Debridement Volume: (cm) No Abnormalities Noted N/A N/A Periwound Skin Texture: Maceration: No N/A N/A Periwound Skin Moisture: Dry/Scaly: No No Abnormalities Noted N/A N/A Periwound Skin Color: No Abnormality N/A N/A Temperature: Cellular or Tissue Based Product N/A N/A Procedures Performed: Debridement Treatment Notes Wound #2 (Calcaneus) Wound Laterality: Left, Distal Cleanser Soap and Water Discharge Instruction: May shower and wash wound with dial antibacterial soap and water prior to dressing change. Wound Cleanser Discharge Instruction: Cleanse the wound with wound cleanser prior to applying a clean dressing using gauze sponges, not tissue or cotton balls. Peri-Wound Care Topical Primary Dressing epifix Discharge Instruction: applied by [place] adaptic and steri-strips Discharge Instruction: secured the epifix Secondary Dressing Mirelez, Marli [name] ([id number]) [id number] Page 4 of 8 ABD Pad, 8x10 Discharge Instruction: Apply over primary dressing as directed. Woven Gauze Sponge, Non-Sterile 4x4 in Discharge Instruction: Apply over primary dressing as directed. Secured With [employer], 4.5x3.1 (in/yd) Discharge Instruction: Secure with Kerlix as directed. Compression Wrap Compression Stockings Add-Ons TCC size 4 Discharge Instruction: last layer applied by physician. Electronic Signature(s) Signed: 03/30/2023 4:25:00 PM By: Geralyn Corwin DO Entered By: Geralyn Corwin on 03/30/2023 15:18:45 -------------------------------------------------------------------------------- Multi-Disciplinary Care Plan Details Patient Name: Date of Service: [name]. 03/30/2023 2:15 PM Medical Record Number: [id number] Patient Account Number: [id number] Date  of Birth/Sex: Treating RN: [birth year] (61 y.o. [name], [name] Primary Care Deosha Werden: [name] Other Clinician: Referring Maksym Pfiffner: Treating Fatisha Rabalais/Extender: [name] in Treatment: 36 Active Inactive Abuse / Safety / Falls / Self Care Management Nursing Diagnoses: History of Falls Goals: Patient will not experience any injury related to falls Date Initiated: 03/05/2022 Target Resolution Date: 04/30/2023 Goal Status: Active Interventions: Assess fall risk on admission and as needed Assess: immobility, friction, shearing, incontinence upon admission and as needed Assess impairment of mobility on admission and as needed per policy Notes: 04/30/22: Fall prevention ongoing, recent fall. Wound/Skin Impairment Nursing Diagnoses: Impaired tissue integrity Goals: Patient/caregiver will verbalize understanding of skin care regimen Date Initiated: 03/05/2022 Target Resolution Date: 04/30/2023 Goal Status: Active Ulcer/skin breakdown will have a volume reduction of 30% by week 4 Date Initiated: 03/05/2022 Date Inactivated: 04/30/2022 Target Resolution Date: 05/02/2022 Goal Status: Met Ulcer/skin breakdown will have a volume reduction of 50% by week 8 Date Initiated: 04/30/2022 Date Inactivated: 07/07/2022 Target  Resolution Date: 07/04/2022 MURSAL, SELL ([id number]) [id number] Page 5 of 8 Unmet Reason: patient sent three Goal Status: Unmet weeks inpatient and rehab before returning to wound center. Interventions: Assess patient/caregiver ability to obtain necessary supplies Assess patient/caregiver ability to perform ulcer/skin care regimen upon admission and as needed Assess ulceration(s) every visit Provide education on smoking Provide education on ulcer and skin care Treatment Activities: Topical wound management initiated : 03/05/2022 Notes: 04/30/22: Wound care regimen continues. Electronic Signature(s) Signed:  03/30/2023 4:53:01 PM By: [name] Stall RN, BSN Entered By: [name] on 03/30/2023 14:22:31 -------------------------------------------------------------------------------- Pain Assessment Details Patient Name: Date of Service: [name] Harness MY [name]. 03/30/2023 2:15 PM Medical Record Number: [id number] Patient Account Number: [id number] Date of Birth/Sex: Treating RN: [birth year] (61 y.o. F) Primary Care Shere Eisenhart: [name] Other Clinician: Referring Marlisa Caridi: Treating Raynald Rouillard/Extender: [name] in Treatment: 55 Active Problems Location of Pain Severity and Description of Pain Patient Has Paino Yes Site Locations Pain Location: Pain in Ulcers Rate the pain. Current Pain Level: 6 Pain Management and Medication Current Pain Management: Electronic Signature(s) Signed: 04/02/2023 11:18:15 AM By: [name] Entered By: [name] on 03/30/2023 14:15:38 Meharg, [name] ([id number]) [id number] Page 6 of 8 -------------------------------------------------------------------------------- Patient/Caregiver Education Details Patient Name: Date of Service: [name] 6/25/2024andnbsp2:15 PM Medical Record Number: [id number] Patient Account Number: [id number] Date of Birth/Gender: Treating RN: April 30, 1961 (62 y.o. [name] Primary Care Physician: [name] Other Clinician: Referring Physician: Treating Physician/Extender: [name] in Treatment: 83 Education Assessment Education Provided To: Patient Education Topics Provided Wound/Skin Impairment: Handouts: Caring for Your Ulcer Methods: Explain/Verbal Responses: Reinforcements needed Electronic Signature(s) Signed: 03/30/2023 4:53:01 PM By: [name] Stall RN, BSN Entered By: [name] on 03/30/2023 14:22:57 -------------------------------------------------------------------------------- Wound Assessment  Details Patient Name: Date of Service: [name]. 03/30/2023 2:15 PM Medical Record Number: [id number] Patient Account Number: [id number] Date of Birth/Sex: Treating RN: 12-29-1960 (61 y.o. F) Primary Care Mamie Hundertmark: [name] Other Clinician: Referring Lyle Niblett: Treating Gaspare Netzel/Extender: [name] in Treatment: 55 Wound Status Wound Number: 2 Primary Diabetic Wound/Ulcer of the Lower Extremity Etiology: Wound Location: Left, Distal Calcaneus Wound Open Wounding Event: Blister Status: Date Acquired: 06/15/2022 Comorbid Anemia, Chronic Obstructive Pulmonary Disease (COPD), Weeks Of Treatment: 39 History: Congestive Heart Failure, Hypertension, Myocardial Infarction, Clustered Wound: No Peripheral Venous Disease, Type II Diabetes, Neuropathy Photos Wound Measurements Length: (cm) 0.5 Seay, [name] ([id number]) Width: (cm) Depth: (cm) Area: (cm) Volume: (cm) % Reduction in Area: 97.5% [id number] Page 7 of 8 0.9 % Reduction in Volume: 97.5% 0.1 Epithelialization: Medium (34-66%) 0.353 Tunneling: No 0.035 Undermining: No Wound Description Classification: Grade 2 Wound Margin: Distinct, outline attached Exudate Amount: Medium Exudate Type: Serosanguineous Exudate Color: red, brown Foul Odor After Cleansing: No Slough/Fibrino Yes Wound Bed Granulation Amount: Large (67-100%) Exposed Structure Granulation Quality: Red, Pink Fascia Exposed: No Necrotic Amount: Small (1-33%) Fat Layer (Subcutaneous Tissue) Exposed: Yes Necrotic Quality: Adherent Slough Tendon Exposed: No Muscle Exposed: No Joint Exposed: No Bone Exposed: No Periwound Skin Texture Texture Color No Abnormalities Noted: Yes No Abnormalities Noted: Yes Moisture Temperature / Pain No Abnormalities Noted: No Temperature: No Abnormality Dry / Scaly: No Maceration: No Treatment Notes Wound #2 (Calcaneus) Wound Laterality: Left,  Distal Cleanser Soap and Water Discharge Instruction: May shower and wash wound with dial antibacterial soap and water prior to dressing change. Wound Cleanser Discharge Instruction: Cleanse the wound with wound cleanser prior to applying a  clean dressing using gauze sponges, not tissue or cotton balls. Peri-Wound Care Topical Primary Dressing epifix Discharge Instruction: applied by Reyanne Hussar adaptic and steri-strips Discharge Instruction: secured the epifix Secondary Dressing ABD Pad, 8x10 Discharge Instruction: Apply over primary dressing as directed. Woven Gauze Sponge, Non-Sterile 4x4 in Discharge Instruction: Apply over primary dressing as directed. Secured With [employer], 4.5x3.1 (in/yd) Discharge Instruction: Secure with Kerlix as directed. Compression Wrap Compression Stockings Add-Ons TCC size 4 Discharge Instruction: last layer applied by physician. Electronic Signature(s) Signed: 04/02/2023 11:18:15 AM By: [name] Entered By: [name] on 03/30/2023 14:39:43 [name], [name] ([id number]) [id number] Page 8 of 8 -------------------------------------------------------------------------------- Vitals Details Patient Name: Date of Service: [name] 03/30/2023 2:15 PM Medical Record Number: [id number] Patient Account Number: [id number] Date of Birth/Sex: Treating RN: 05-25-1961 (62 y.o. F) Primary Care Meyli Boice: [name] Other Clinician: Referring Arnol Mcgibbon: Treating Corinda Ammon/Extender: [name] in Treatment: 55 Vital Signs Time Taken: 14:15 Temperature (F): 98.5 Height (in): 66 Pulse (bpm): 82 Weight (lbs): 339 Respiratory Rate (breaths/min): 18 Body Mass Index (BMI): 54.7 Blood Pressure (mmHg): 133/72 Capillary Blood Glucose (mg/dl): 010 Reference Range: 80 - 120 mg / dl Electronic Signature(s) Signed: 04/02/2023 11:18:15 AM By: [name] Entered By: [name] on 03/30/2023 14:20:16

## 2023-04-05 ENCOUNTER — Encounter (HOSPITAL_BASED_OUTPATIENT_CLINIC_OR_DEPARTMENT_OTHER): Payer: 59 | Attending: Internal Medicine | Admitting: Internal Medicine

## 2023-04-05 DIAGNOSIS — L97522 Non-pressure chronic ulcer of other part of left foot with fat layer exposed: Secondary | ICD-10-CM | POA: Insufficient documentation

## 2023-04-05 DIAGNOSIS — E11621 Type 2 diabetes mellitus with foot ulcer: Secondary | ICD-10-CM | POA: Diagnosis present

## 2023-04-05 DIAGNOSIS — Z6841 Body Mass Index (BMI) 40.0 and over, adult: Secondary | ICD-10-CM | POA: Diagnosis not present

## 2023-04-05 DIAGNOSIS — I110 Hypertensive heart disease with heart failure: Secondary | ICD-10-CM | POA: Insufficient documentation

## 2023-04-05 DIAGNOSIS — L97422 Non-pressure chronic ulcer of left heel and midfoot with fat layer exposed: Secondary | ICD-10-CM | POA: Diagnosis not present

## 2023-04-05 DIAGNOSIS — I5032 Chronic diastolic (congestive) heart failure: Secondary | ICD-10-CM | POA: Diagnosis not present

## 2023-04-05 NOTE — Progress Notes (Addendum)
Chaplin, [name] ([id number]) [id number] Page 1 of 12 Visit Report for 04/05/2023 Chief Complaint Document Details Patient Name: Date of Service: [name]. 04/05/2023 8:00 [name] M Medical Record Number: [id number] Patient Account Number: [id number] Date of Birth/Sex: Treating RN: September 14, 1961 (62 y.o. F) Primary Care Provider: Junious [name] Other Clinician: Referring Provider: Treating Provider/Extender: [name] in Treatment: 02 Information Obtained from: Patient Chief Complaint 03/05/2022; left foot wound Electronic Signature(s) Signed: 04/05/2023 10:26:47 AM By: [name] Corwin DO Entered By: [name] on 04/05/2023 09:50:12 -------------------------------------------------------------------------------- Cellular or Tissue Based Product Details Patient Name: Date of Service: [name], [name]. 04/05/2023 8:00 [name] M Medical Record Number: [id number] Patient Account Number: [id number] Date of Birth/Sex: Treating RN: 1961/03/30 (62 y.o. [name], [name] Primary Care Provider: Junious [name] Other Clinician: Referring Provider: Treating Provider/Extender: [name] in Treatment: 56 Cellular or Tissue Based Product Type Wound #2 Left,Distal Calcaneus Applied to: Performed By: Physician [name] Corwin, DO Cellular or Tissue Based Product Type: Epifix Level of Consciousness (Pre-procedure): Awake and Alert Pre-procedure Verification/Time Out Yes - 08:45 Taken: Location: genitalia / hands / feet / multiple digits Wound Size (sq cm): 0.01 Product Size (sq cm): 3 Waste Size (sq cm): 0 Amount of Product Applied (sq cm): 3 Instrument Used: Forceps, Scissors Lot #: [id number] Order #: [id number] Expiration Date: 10/06/2027 Fenestrated: No Reconstituted: Yes Solution Type: normal saline Solution Amount: 3mL Lot #: [id number] Solution Expiration Date: 03/04/2025 Secured: Yes Secured With:  Steri-Strips, adaptic Dressing Applied: No Procedural Pain: 0 Post Procedural Pain: 0 Response to Treatment: Procedure was tolerated well Level of Consciousness (Post- Awake and Alert procedure): Wallick, [name] ([id number]) [id number] Page 2 of 12 Post Procedure Diagnosis Same as Pre-procedure Electronic Signature(s) Signed: 04/05/2023 10:26:47 AM By: [name] Corwin DO Signed: 04/05/2023 4:45:04 PM By: [name] Stall RN, BSN Entered By: [name] on 04/05/2023 09:30:11 -------------------------------------------------------------------------------- HPI Details Patient Name: Date of Service: [name], [name]. 04/05/2023 8:00 [name] M Medical Record Number: [id number] Patient Account Number: [id number] Date of Birth/Sex: Treating RN: [birth year] (61 y.o. F) Primary Care Provider: Junious [name] Other Clinician: Referring Provider: Treating Provider/Extender: [name] in Treatment: 56 History of Present Illness HPI Description: Admission 03/05/2022 [name] is [name] 62 year old female with [name] past medical history of uncontrolled insulin-dependent type 2 diabetes, tobacco user and chronic diastolic heart failure that presents to the clinic for [name] 39-month history of wound to her left heel. She states she had [name] left ankle fusion in September 2022. She states that she always had [name] wound after the surgery and it never healed. She has home health and they have been doing compression wraps along with silver alginate to the wound bed. She currently denies signs of infection. 6/8; this is [name] 62 year old woman with type 2 diabetes. She developed [name] wound on her left Achilles heel just above the tip of the heel in the setting of recurrent ankle surgeries in late 2022. I have seen some of these results from either the cast or the surgical boots that are put on after these operations. She thinks this may be the case. And [name] sense of pressure ulcer.  In any case that she has been using Santyl Hydrofera Blue under compression. She is not wearing any footwear at home as she cannot find anything to accommodate the wrap 6/22; patient presents for follow-up. She has home health that comes out once [name] week to help with dressing  changes. She has no issues or complaints today. We have been using Hydrofera Blue under compression therapy. 7/6; patient presents for follow-up. She states that home health did not come out for the past 2 weeks. It is unclear why. We have been using Hydrofera Blue and Santyl under compression therapy. 7/13; patient presents for follow-up. She did not take the oral antibiotics prescribed at last clinic visit. We have been using Hydrofera Blue with gentamicin/mupirocin ointment under compression therapy. She has no issues or complaints today. She denies signs of infection. 7/20; patient presents for follow-up. We have been using Hydrofera Blue with antibiotic ointment under 3 layer compression. She has home health that change the dressing once. They put collagen on the wound bed. She also reports falling yesterday and hitting her right foot. She has no pain to the area today. 7/27; patient presents for follow-up. We have been using Hydrofera Blue under 3 layer compression. She has home health that changes the dressing. She has no issues or complaints today. 8/3; patient presents for follow-up. We continues to use Hydrofera Blue under 3 layer compression. She has no issues or complaints today. She has been approved for Epicord at 100%. 8/11; patient presents for follow-up. We have been using Hydrofera Blue under 3 layer compression. She has been approved for Epicord and we have this today. She is in agreement with having this applied. 8/18; patient presents for follow-up. Epicord #1 was placed in standard fashion last clinic visit. She has no issues or complaints today. 9/18; Unfortunately patient has missed her last clinic  appointments due to falling and breaking her right ankle. We have been following her for her left ankle wound. She has also developed [name] large blister to the left heel over the past week that has ruptured and dried. She currently resides In [facility]. Wound9/25; patient presents for follow-up. We have been using Hydrofera Blue and Santyl to the original left ankle wound and Xeroform to the dried blistered. We have been wrapping her with compression therapy. She resides in [name] facility and they changed the wrap once this past week. She has no issues or complaints today. She denies signs of infection. 10/3; patient presents for follow-up. We have been using Xeroform to the heel wound and Epicort to the left ankle wound All under compression therapy. She has no issues or complaints today. 10/10; patient presents for follow-up. We have been using Epicort to the left ankle wound and Santyl and Hydrofera Blue to the heel wound. All under compression therapy. she has no issues or complaints today. 10/16; patient presents for follow-up. Epicort was placed in standard fashion to the superior wound and onto the heel and Santyl and Hydrofera Blue. She states that the wrap got wet during her shower and her facility was able to rewrap with Kerlix/Coban. 10/23; patient presents for follow-up. Epicord was placed to the wound beds last clinic visit. We continue Kerlix/Coban. She has no issues or complaints today. 10/31; Patient presents for follow-up. Epicord was placed to the wound beds last clinic visit. The original wound is almost healed. We have done this under Kerlix/Coban. She denies signs of infection. Budlong, [name] ([id number]) [id number] Page 3 of 12 11/10; patient presents for follow-up. Patient's last epi cord was placed in standard fashion at last clinic visit. The original wound has healed. She still has [name] heel wound. Grafix was approved however too costly for the  patient. We will rerun epi cord to see if she can have more  applications. She had done very well with this. We have been using Hydrofera Blue to the heel under Kerlix/Coban. 11/21; patient presents for follow-up. We have been using Hydrofera Blue and Santyl to the heel under Kerlix/Coban. She switched to [name] new healthcare insurance and again The skin substitute is too costly for the patient. She denies signs of infection. 11/28; patient presents for follow-up. Her facility did not change the wrap last clinic visit due to lack of staffing. The wrap was taken off and Hydrofera Blue dressing has been in place for the past week. She has no issues or complaints today. 12/5; patient presents for follow-up. She states she has had more drainage over the past week. They did not change the wrap at her facility. She states the nurse will be back this week. She is also been doing [name] lot of physical therapy. She has been using the Prevalon boot while in bed. 12/12; patient presents for follow-up. We have been using Hydrofera Blue with antibiotic ointment under 4-layer compression. She is tolerated the wrap well. She states she is using her Prevalon boot while in the wheelchair. She has no issues or complaints today. There is been improvement in wound healing. 12/19; patient presents for follow-up. We have been using Hydrofera Blue and antibiotic ointment to the wound bed under 4-layer compression. Her facility change the wrap once. She states she has been using the Prevalon boot in bed but not in the wheelchair due to issues with transferring. Overall there is still improvement in wound healing. 1/16; patient presents for follow-up. She missed her last clinic appointment. We have been using Hydrofera Blue and antibiotic ointment under 4-layer compression. At the facility this has only been changed twice over the past 3 weeks. They are not using 4-layer compression. She came in with Kerlix/Coban. 1/23; patient  presents for follow-up. We have been using Santyl and Hydrofera Blue under 4-layer compression. Patient has no issues or complaints today. 2/1; patient presents for follow-up. Patient's been using Santyl and Hydrofera Blue to the wound bed. She has developed more slough and now [name] mild odor. She states she is wearing the Prevalon boot at night. It is unclear if she is offloading this area during the day. 2/13; patient presents for follow-up. Patient's been using Dakin's wet-to-dry dressings. She received her Keystone antibiotic ointment in the mail. She has not used this yet. She reports using her Prevalon boot. She has no issues or complaints today. 2/20; patient presents for follow-up. She has been using Keystone antibiotic ointment with Hydrofera Blue. She has no issues or complaints today. Due to transportation she cannot do the total contact cast today. She is scheduled for next week to have this placed. 2/27; patient presents for follow-up. Plan is for the total contact cast today. We have been using Keystone and Hydrofera Blue to the wound bed. She forgot her Keystone antibiotic ointment. She has no issues or complaints today. 2/29; patient presents for follow-up. Patient presents for her obligatory cast change. She has no issues or complaints today. 3/5; patient presents for follow-up. She has had the cast in place for the past week and has tolerated this well. She has no issues or complaints today. We have been using PolyMem with Keystone antibiotic ointment. 12/15/2022: The wound is quite clean and measured smaller today. 3/19; we have been using Keystone antibiotic ointment with PolyMem silver under the total contact cast. Overall there is been improvement in wound healing. 3/26; patient presents for follow-up. She is  been approved for epi fix. She would like to proceed with this today. Previously we have been using Keystone antibiotic ointment with PolyMem silver under the total contact  cast. She has no issues or complaints. 01/05/2023: The wound is clean and she is tolerating total contact casting without difficulty. She had her first application of EpiFix last week. 4/9; patient presents for follow-up. We have been applying EpiFix under the total contact cast for the past 2 weeks. She is tolerated this well. She has no issues or complaints today. 4/16; patient presents for follow-up. We have been using EpiFix under the total contact cast. She has done well with this. Wound is smaller. She does have slough buildup. 4/23; patient presents for follow-up. We have been using EpiFix under the total contact cast. Wound appears well-healing. She does have slough buildup. 4/30; patient presents for follow-up. We have been using EpiFix under the total contact cast. Wound is smaller. She again has slough buildup. 5/6; patient with [name] left heel ulcer type II diabetic. We applied EpiFix #7 under [name] total contact cast. Her wound is making good progress. 5/14; patient presents for follow-up. We have been using EpiFix under the total contact casting wound is smaller. 5/21; patient presents for follow-up. We have been using EpiFix under the total contact cast. The wound is smaller. She has no issues or complaints today. 5/28; patient presents for follow-up. We have been using EpiFix under the total contact cast. Wound is smaller. 6/11; patient presents for follow-up. We have been using EpiFix under the total contact cast. Wound is stable. 6/17; patient presents for follow-up. We have been using EpiFix under the total contact cast. Wound is smaller. 6/25; patient presents for follow-up. We have been using EpiFix under the total contact cast. Wound continues to improve in size and is much smaller. 7/1; patient presents for follow-up. We have been using EpiFix under the total contact cast. Wound is almost healed. Electronic Signature(s) Signed: 04/05/2023 10:26:47 AM By: [name] Corwin DO Entered By:  [name] on 04/05/2023 09:50:21 Desta, [name] ([id number]) [id number] Page 4 of 12 -------------------------------------------------------------------------------- Physical Exam Details Patient Name: Date of Service: [name] Harness MY [name]. 04/05/2023 8:00 [name] M Medical Record Number: [id number] Patient Account Number: [id number] Date of Birth/Sex: Treating RN: [birth year] (62 y.o. F) Primary Care Provider: Junious [name] Other Clinician: Referring Provider: Treating Provider/Extender: [name] in Treatment: 56 Constitutional respirations regular, non-labored and within target range for patient.. Cardiovascular 2+ dorsalis pedis/posterior tibialis pulses. Psychiatric pleasant and cooperative. Notes Small open wound to the heel with granulation tissue. No signs of surrounding infection. Electronic Signature(s) Signed: 04/05/2023 10:26:47 AM By: [name] Corwin DO Entered By: [name] on 04/05/2023 09:50:38 -------------------------------------------------------------------------------- Physician Orders Details Patient Name: Date of Service: [name], [name]. 04/05/2023 8:00 [name] M Medical Record Number: [id number] Patient Account Number: [id number] Date of Birth/Sex: Treating RN: 07/17/1961 (62 y.o. F) Primary Care Provider: Junious [name] Other Clinician: Referring Provider: Treating Provider/Extender: [name] in Treatment: 72 Verbal / Phone Orders: No Diagnosis Coding ICD-10 Coding Code Description [phone] Non-pressure chronic ulcer of other part of left foot with fat layer exposed E11.621 Type 2 diabetes mellitus with foot ulcer E66.01 Morbid (severe) obesity due to excess calories Follow-up Appointments ppointment in 1 week. - 04/13/2023 Dr. Mikey Bussing [phone] Return [name] Other: - Leave dressing in place. Facility not to change dressing. Anesthetic (In clinic) Topical Lidocaine  5% applied to wound bed (In clinic) Topical Lidocaine  4% applied to wound bed Cellular or Tissue Based Products Cellular or Tissue Based Product Type: - Run IVR for EpiFix and Epicord=100% covered 05/07/22: Epicord ordered 12/08/2022: Run insurance for epicord and theraskin. 01/19/2023: Run for organogenesis- puraply AM daptic or Mepitel. (DO NOT REMOVE). - Cellular or Tissue Based Product applied to wound bed, secured with steri-strips, cover with [name] Epicord #1 05/15/25 Epicord #2 05/22/22 Epicord #3 06/29/2022 Epicord #4 07/07/2022 Epifix #5 07/14/22 Epicord #6 07/20/22 Epicord # 7 07/27/22 ***Run for Grafix***PENDING Epicord #8 08/04/2022 Willison, [name] ([id number]) [id number] Page 5 of 12 2024-IVR epifix approved 12/29/2022 #1 epifix applied to left heel. 01/05/2023 #2 epifix applied to left heel. 01/12/2023 #3 epifix applied to left heel 01/19/2023 #4 epifix applied to left heel. 01/26/23 Epifix #5 02/08/2023 #7 epifix 02/16/2023 #8 epifix 02/23/2023 #9 epifix 03/02/23 #10 epifix 03/09/2023 #11 epifix 18mm disk 03/16/2023 #12 epifix 18mm Disk 03/22/2023 #13 epifix 18mm DIsk 03/30/2023 #14 epifix 18mm Disk 04/05/2023 #15 epifix 18mm Disk Bathing/ Shower/ Hygiene May shower with protection but do not get wound dressing(s) wet. Protect dressing(s) with water repellant cover (for example, large plastic bag) or [name] cast cover and may then take shower. Edema Control - Lymphedema / SCD / Other Elevate legs to the level of the heart or above for 30 minutes daily and/or when sitting for 3-4 times [name] day throughout the day. Avoid standing for long periods of time. Off-Loading Total Contact Cast to Left Lower Extremity - size 4 Additional Orders / Instructions Follow Nutritious Diet - Monitor/Control Blood Sugar Wound Treatment Wound #2 - Calcaneus Wound Laterality: Left, Distal Cleanser: Soap and Water 1 x Per Week/30 Days Discharge Instructions: May shower and wash wound with  dial antibacterial soap and water prior to dressing change. Cleanser: Wound Cleanser 1 x Per Week/30 Days Discharge Instructions: Cleanse the wound with wound cleanser prior to applying [name] clean dressing using gauze sponges, not tissue or cotton balls. Prim Dressing: epifix 1 x Per Week/30 Days ary Discharge Instructions: applied by provider Prim Dressing: adaptic and steri-strips 1 x Per Week/30 Days ary Discharge Instructions: secured the epifix Secondary Dressing: ABD Pad, 8x10 1 x Per Week/30 Days Discharge Instructions: Apply over primary dressing as directed. Secondary Dressing: Woven Gauze Sponge, Non-Sterile 4x4 in (Home Health) 1 x Per Week/30 Days Discharge Instructions: Apply over primary dressing as directed. Secured With: [employer], 4.5x3.1 (in/yd) 1 x Per Week/30 Days Discharge Instructions: Secure with Kerlix as directed. Add-Ons: TCC size 4 1 x Per Week/30 Days Discharge Instructions: last layer applied by physician. Electronic Signature(s) Signed: 04/05/2023 10:26:47 AM By: [name] Corwin DO Entered By: [name] on 04/05/2023 09:50:46 -------------------------------------------------------------------------------- Problem List Details Patient Name: Date of Service: [name], [name]. 04/05/2023 8:00 [name] M Medical Record Number: [id number] Patient Account Number: [id number] Date of Birth/Sex: Treating RN: [birth year] (62 y.o. [name], [name] Primary Care Provider: Junious [name] Other Clinician: Referring Provider: Treating Provider/Extender: [name] in Treatment: [address], [place] [name] ([id number]) [id number] Page 6 of 12 Active Problems ICD-10 Encounter Code Description Active Date MDM Diagnosis L97.522 Non-pressure chronic ulcer of other part of left foot with fat layer exposed 03/05/2022 No Yes E11.621 Type 2 diabetes mellitus with foot ulcer 03/05/2022 No Yes E66.01 Morbid (severe) obesity  due to excess calories 03/05/2022 No Yes Inactive Problems Resolved Problems Electronic Signature(s) Signed: 04/05/2023 10:26:47 AM By: [name] Corwin DO Entered By: [name] on 04/05/2023 09:48:56 -------------------------------------------------------------------------------- Progress Note Details Patient Name: Date of  Service: [name], [name]. 04/05/2023 8:00 [name] M Medical Record Number: [id number] Patient Account Number: [id number] Date of Birth/Sex: Treating RN: 10/12/1960 (62 y.o. F) Primary Care Provider: Junious [name] Other Clinician: Referring Provider: Treating Provider/Extender: [name] in Treatment: 56 Subjective Chief Complaint Information obtained from Patient 03/05/2022; left foot wound History of Present Illness (HPI) Admission 03/05/2022 Ms. Kionna Gillock is [name] 62 year old female with [name] past medical history of uncontrolled insulin-dependent type 2 diabetes, tobacco user and chronic diastolic heart failure that presents to the clinic for [name] 54-month history of wound to her left heel. She states she had [name] left ankle fusion in September 2022. She states that she always had [name] wound after the surgery and it never healed. She has home health and they have been doing compression wraps along with silver alginate to the wound bed. She currently denies signs of infection. 6/8; this is [name] 62 year old woman with type 2 diabetes. She developed [name] wound on her left Achilles heel just above the tip of the heel in the setting of recurrent ankle surgeries in late 2022. I have seen some of these results from either the cast or the surgical boots that are put on after these operations. She thinks this may be the case. And [name] sense of pressure ulcer. In any case that she has been using Santyl Hydrofera Blue under compression. She is not wearing any footwear at home as she cannot find anything to accommodate the wrap 6/22; patient presents for follow-up. She has  home health that comes out once [name] week to help with dressing changes. She has no issues or complaints today. We have been using Hydrofera Blue under compression therapy. 7/6; patient presents for follow-up. She states that home health did not come out for the past 2 weeks. It is unclear why. We have been using Hydrofera Blue and Santyl under compression therapy. 7/13; patient presents for follow-up. She did not take the oral antibiotics prescribed at last clinic visit. We have been using Hydrofera Blue with gentamicin/mupirocin ointment under compression therapy. She has no issues or complaints today. She denies signs of infection. 7/20; patient presents for follow-up. We have been using Hydrofera Blue with antibiotic ointment under 3 layer compression. She has home health that change the dressing once. They put collagen on the wound bed. She also reports falling yesterday and hitting her right foot. She has no pain to the area today. 7/27; patient presents for follow-up. We have been using Hydrofera Blue under 3 layer compression. She has home health that changes the dressing. She has no issues or complaints today. 8/3; patient presents for follow-up. We continues to use Hydrofera Blue under 3 layer compression. She has no issues or complaints today. She has been approved for Epicord at 100%. Bales, Laprecious [name] ([id number]) [id number] Page 7 of 12 8/11; patient presents for follow-up. We have been using Hydrofera Blue under 3 layer compression. She has been approved for Epicord and we have this today. She is in agreement with having this applied. 8/18; patient presents for follow-up. Epicord #1 was placed in standard fashion last clinic visit. She has no issues or complaints today. 9/18; Unfortunately patient has missed her last clinic appointments due to falling and breaking her right ankle. We have been following her for her left ankle wound. She has also developed [name] large  blister to the left heel over the past week that has ruptured and dried. She currently resides In [facility]. Wound9/25;  patient presents for follow-up. We have been using Hydrofera Blue and Santyl to the original left ankle wound and Xeroform to the dried blistered. We have been wrapping her with compression therapy. She resides in [name] facility and they changed the wrap once this past week. She has no issues or complaints today. She denies signs of infection. 10/3; patient presents for follow-up. We have been using Xeroform to the heel wound and Epicort to the left ankle wound All under compression therapy. She has no issues or complaints today. 10/10; patient presents for follow-up. We have been using Epicort to the left ankle wound and Santyl and Hydrofera Blue to the heel wound. All under compression therapy. she has no issues or complaints today. 10/16; patient presents for follow-up. Epicort was placed in standard fashion to the superior wound and onto the heel and Santyl and Hydrofera Blue. She states that the wrap got wet during her shower and her facility was able to rewrap with Kerlix/Coban. 10/23; patient presents for follow-up. Epicord was placed to the wound beds last clinic visit. We continue Kerlix/Coban. She has no issues or complaints today. 10/31; Patient presents for follow-up. Epicord was placed to the wound beds last clinic visit. The original wound is almost healed. We have done this under Kerlix/Coban. She denies signs of infection. 11/10; patient presents for follow-up. Patient's last epi cord was placed in standard fashion at last clinic visit. The original wound has healed. She still has [name] heel wound. Grafix was approved however too costly for the patient. We will rerun epi cord to see if she can have more applications. She had done very well with this. We have been using Hydrofera Blue to the heel under Kerlix/Coban. 11/21; patient presents for follow-up. We have been  using Hydrofera Blue and Santyl to the heel under Kerlix/Coban. She switched to [name] new healthcare insurance and again The skin substitute is too costly for the patient. She denies signs of infection. 11/28; patient presents for follow-up. Her facility did not change the wrap last clinic visit due to lack of staffing. The wrap was taken off and Hydrofera Blue dressing has been in place for the past week. She has no issues or complaints today. 12/5; patient presents for follow-up. She states she has had more drainage over the past week. They did not change the wrap at her facility. She states the nurse will be back this week. She is also been doing [name] lot of physical therapy. She has been using the Prevalon boot while in bed. 12/12; patient presents for follow-up. We have been using Hydrofera Blue with antibiotic ointment under 4-layer compression. She is tolerated the wrap well. She states she is using her Prevalon boot while in the wheelchair. She has no issues or complaints today. There is been improvement in wound healing. 12/19; patient presents for follow-up. We have been using Hydrofera Blue and antibiotic ointment to the wound bed under 4-layer compression. Her facility change the wrap once. She states she has been using the Prevalon boot in bed but not in the wheelchair due to issues with transferring. Overall there is still improvement in wound healing. 1/16; patient presents for follow-up. She missed her last clinic appointment. We have been using Hydrofera Blue and antibiotic ointment under 4-layer compression. At the facility this has only been changed twice over the past 3 weeks. They are not using 4-layer compression. She came in with Kerlix/Coban. 1/23; patient presents for follow-up. We have been using Santyl and Hydrofera Blue  under 4-layer compression. Patient has no issues or complaints today. 2/1; patient presents for follow-up. Patient's been using Santyl and Hydrofera Blue to the  wound bed. She has developed more slough and now [name] mild odor. She states she is wearing the Prevalon boot at night. It is unclear if she is offloading this area during the day. 2/13; patient presents for follow-up. Patient's been using Dakin's wet-to-dry dressings. She received her Keystone antibiotic ointment in the mail. She has not used this yet. She reports using her Prevalon boot. She has no issues or complaints today. 2/20; patient presents for follow-up. She has been using Keystone antibiotic ointment with Hydrofera Blue. She has no issues or complaints today. Due to transportation she cannot do the total contact cast today. She is scheduled for next week to have this placed. 2/27; patient presents for follow-up. Plan is for the total contact cast today. We have been using Keystone and Hydrofera Blue to the wound bed. She forgot her Keystone antibiotic ointment. She has no issues or complaints today. 2/29; patient presents for follow-up. Patient presents for her obligatory cast change. She has no issues or complaints today. 3/5; patient presents for follow-up. She has had the cast in place for the past week and has tolerated this well. She has no issues or complaints today. We have been using PolyMem with Keystone antibiotic ointment. 12/15/2022: The wound is quite clean and measured smaller today. 3/19; we have been using Keystone antibiotic ointment with PolyMem silver under the total contact cast. Overall there is been improvement in wound healing. 3/26; patient presents for follow-up. She is been approved for epi fix. She would like to proceed with this today. Previously we have been using Keystone antibiotic ointment with PolyMem silver under the total contact cast. She has no issues or complaints. 01/05/2023: The wound is clean and she is tolerating total contact casting without difficulty. She had her first application of EpiFix last week. 4/9; patient presents for follow-up. We have been  applying EpiFix under the total contact cast for the past 2 weeks. She is tolerated this well. She has no issues or complaints today. 4/16; patient presents for follow-up. We have been using EpiFix under the total contact cast. She has done well with this. Wound is smaller. She does have slough buildup. 4/23; patient presents for follow-up. We have been using EpiFix under the total contact cast. Wound appears well-healing. She does have slough buildup. 4/30; patient presents for follow-up. We have been using EpiFix under the total contact cast. Wound is smaller. She again has slough buildup. 5/6; patient with [name] left heel ulcer type II diabetic. We applied EpiFix #7 under [name] total contact cast. Her wound is making good progress. 5/14; patient presents for follow-up. We have been using EpiFix under the total contact casting wound is smaller. 5/21; patient presents for follow-up. We have been using EpiFix under the total contact cast. The wound is smaller. She has no issues or complaints today. Enrico, Labresha [name] ([id number]) [id number] Page 8 of 12 5/28; patient presents for follow-up. We have been using EpiFix under the total contact cast. Wound is smaller. 6/11; patient presents for follow-up. We have been using EpiFix under the total contact cast. Wound is stable. 6/17; patient presents for follow-up. We have been using EpiFix under the total contact cast. Wound is smaller. 6/25; patient presents for follow-up. We have been using EpiFix under the total contact cast. Wound continues to improve in size and is much smaller. 7/1;  patient presents for follow-up. We have been using EpiFix under the total contact cast. Wound is almost healed. Patient History Information obtained from Patient, Chart. Family History Cancer - Father,Mother, Diabetes - Mother,Maternal Grandparents,Paternal Grandparents, Heart Disease - Siblings,Maternal Grandparents, Hypertension - Siblings,  Tuberculosis - Maternal Grandparents,Paternal Grandparents. Social History Current every day smoker, Marital Status - Single, Alcohol Use - Never, Drug Use - No History, Caffeine Use - Daily. 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 Vitals Time Taken: 8:03 AM, Height: 66 in, Weight: 339 lbs, BMI: 54.7, Temperature: 98.7 F, Pulse: 91 bpm, Respiratory Rate: 20 breaths/min, Blood Pressure: 139/76 mmHg. Cardiovascular 2+ dorsalis pedis/posterior tibialis pulses. Psychiatric pleasant and cooperative. General Notes: Small open wound to the heel with granulation tissue. No signs of surrounding infection. Integumentary (Hair, Skin) Wound #2 status is Open. Original cause of wound was Blister. The date acquired was: 06/15/2022. The wound has been in treatment 40 weeks. The wound is located on the Left,Distal Calcaneus. The wound measures 0.1cm length x 0.1cm width x 0.1cm depth; 0.008cm^2 area and 0.001cm^3 volume. There is no tunneling or undermining noted. There is [name] none present amount of drainage noted. The wound margin is distinct with the outline attached to the wound base. There is no granulation within the wound bed. There is no necrotic tissue within the wound bed. The periwound skin appearance had no abnormalities noted for texture. The periwound skin appearance  had no abnormalities noted for color. The periwound skin appearance did not exhibit: Dry/Scaly, Maceration. Periwound temperature was noted as No Abnormality. Assessment Active Problems ICD-10 Non-pressure chronic ulcer of other part of left foot with fat layer exposed Type 2 diabetes mellitus with foot ulcer Morbid (severe) obesity due to excess calories Schuler, [name] ([id number]) [id number] Page 9 of 12 Patient's wound has shown improvement in size and appearance since last clinic visit. She still has [name] very small wound remaining and EpiFix was placed in standard fashion. We continued the total contact cast. Follow-up in 1 week. Procedures Wound #2 Pre-procedure diagnosis of Wound #2 is [name] Diabetic Wound/Ulcer of the Lower Extremity located on the Left,Distal Calcaneus. [name] skin graft procedure using [name] bioengineered skin substitute/cellular or tissue based product was performed by [name] Corwin, DO with the following instrument(s): Forceps and Scissors. Epifix was applied and secured with Steri-Strips and adaptic. 3 sq cm of product was utilized and 0 sq cm was wasted. Post Application, no dressing was applied. [name] Time Out was conducted at 08:45, prior to the start of the procedure. The procedure was tolerated well with [name] pain level of 0 throughout and [name] pain level of 0 following the procedure. Post procedure Diagnosis Wound #2: Same as Pre-Procedure . Pre-procedure diagnosis of Wound #2 is [name] Diabetic Wound/Ulcer of the Lower Extremity located on the Left,Distal Calcaneus . There was [name] T Research scientist (life sciences) otal Procedure by [name] Corwin, DO. Post procedure Diagnosis Wound #2: Same as Pre-Procedure Notes: size 4. Plan Follow-up Appointments: Return Appointment in 1 week. - 04/13/2023 Dr. Mikey Bussing 0800 Other: - Leave  dressing in place. Facility not to change dressing. Anesthetic: (In clinic) Topical Lidocaine 5% applied to wound bed (In clinic) Topical Lidocaine  4% applied to wound bed Cellular or Tissue Based Products: Cellular or Tissue Based Product Type: - Run IVR for EpiFix and Epicord=100% covered 05/07/22: Epicord ordered 12/08/2022: Run insurance for epicord and theraskin. 01/19/2023: Run for organogenesis- puraply AM Cellular or Tissue Based Product applied to wound bed, secured with steri-strips, cover with Adaptic or Mepitel. (DO NOT REMOVE). - Epicord #1 05/15/25 Epicord #2 05/22/22 Epicord #3 06/29/2022 Epicord #4 07/07/2022 Epifix #5 07/14/22 Epicord #6 07/20/22 Epicord # 7 07/27/22 ***Run for Grafix***PENDING Epicord #8 08/04/2022 2024-IVR epifix approved 12/29/2022 #1 epifix applied to left heel. 01/05/2023 #2 epifix applied to left heel. 01/12/2023 #3 epifix applied to left heel 01/19/2023 #4 epifix applied to left heel. 01/26/23 Epifix #5 02/08/2023 #7 epifix 02/16/2023 #8 epifix 02/23/2023 #9 epifix 03/02/23 #10 epifix 03/09/2023 #11 epifix 18mm disk 03/16/2023 #12 epifix 18mm Disk 03/22/2023 #13 epifix 18mm DIsk 03/30/2023 #14 epifix 18mm Disk 04/05/2023 #15 epifix 18mm Disk Bathing/ Shower/ Hygiene: May shower with protection but do not get wound dressing(s) wet. Protect dressing(s) with water repellant cover (for example, large plastic bag) or [name] cast cover and may then take shower. Edema Control - Lymphedema / SCD / Other: Elevate legs to the level of the heart or above for 30 minutes daily and/or when sitting for 3-4 times [name] day throughout the day. Avoid standing for long periods of time. Off-Loading: T Contact Cast to Left Lower Extremity - size 4 otal Additional Orders / Instructions: Follow Nutritious Diet - Monitor/Control Blood Sugar WOUND #2: - Calcaneus Wound Laterality: Left, Distal Cleanser: Soap and Water 1 x Per Week/30 Days Discharge Instructions: May shower and wash wound with dial antibacterial soap and water prior to dressing change. Cleanser: Wound Cleanser 1 x Per Week/30 Days Discharge Instructions: Cleanse the wound with wound cleanser  prior to applying [name] clean dressing using gauze sponges, not tissue or cotton balls. Prim Dressing: epifix 1 x Per Week/30 Days ary Discharge Instructions: applied by provider Prim Dressing: adaptic and steri-strips 1 x Per Week/30 Days ary Discharge Instructions: secured the epifix Secondary Dressing: ABD Pad, 8x10 1 x Per Week/30 Days Discharge Instructions: Apply over primary dressing as directed. Secondary Dressing: Woven Gauze Sponge, Non-Sterile 4x4 in (Home Health) 1 x Per Week/30 Days Discharge Instructions: Apply over primary dressing as directed. Secured With: [employer], 4.5x3.1 (in/yd) 1 x Per Week/30 Days Discharge Instructions: Secure with Kerlix as directed. Add-Ons: TCC size 4 1 x Per Week/30 Days Discharge Instructions: last layer applied by physician. 1. EpiFix placed in standard fashion 2. T contact cast placed in standard fashion to the left lower extremity otal 3. Follow-up in 1 week Electronic Signature(s) Signed: 04/05/2023 10:26:47 AM By: [name] Corwin DO Entered By: [name] on 04/05/2023 09:52:26 Agard, [name] ([id number]) [id number] Page 10 of 12 -------------------------------------------------------------------------------- HxROS Details Patient Name: Date of Service: [name]. 04/05/2023 8:00 [name] M Medical Record Number: [id number] Patient Account Number: [id number] Date of Birth/Sex: Treating RN: 1961/09/15 (63 y.o. F) Primary Care Provider: Junious [name] Other Clinician: Referring Provider: Treating Provider/Extender: [name] in Treatment: 56 Information Obtained From Patient Chart Hematologic/Lymphatic Medical History: Positive for: Anemia Respiratory Medical History: Positive for: Chronic Obstructive Pulmonary Disease (COPD) Cardiovascular Medical History: Positive for: Congestive Heart Failure - Weighs self daily; Hypertension; Myocardial Infarction;  Peripheral Venous Disease Gastrointestinal Medical History: Past Medical  History Notes: GERD, Peptic Ulcer Disease Endocrine Medical History: Positive for: Type II Diabetes Time with diabetes: 16 years Treated with: Insulin, Oral agents Blood sugar tested every day: Yes Tested : Twice daily Musculoskeletal Medical History: Past Medical History Notes: Broke left leg 3 years ago, 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, foot fused Neurologic Medical History: Positive for: Neuropathy Psychiatric Medical History: Past Medical History Notes: Depression Immunizations Pneumococcal Vaccine: Received Pneumococcal Vaccination: No Implantable Devices None Family and Social History Cancer: Yes - Father,Mother; Diabetes: Yes - Mother,Maternal Grandparents,Paternal Grandparents; Heart Disease: Yes - Siblings,Maternal Grandparents; Hypertension: Yes - Siblings; Tuberculosis: Yes - Maternal Grandparents,Paternal Grandparents; Current every day smoker; Marital Status - Single; Alcohol Use: Never; Drug Use: No History; Caffeine Use: Daily; Financial Concerns: No; Food, Clothing or Shelter Needs: No; Support System Lacking: No; Transportation Concerns: No Ruscitti, Mirna [name] ([id number]) [phone].pdf Page 11 of 12 Electronic Signature(s) Signed: 04/05/2023 10:26:47 AM By: [name] Corwin DO Entered By: [name] on 04/05/2023 09:50:25 -------------------------------------------------------------------------------- Total Contact Cast Details Patient Name: Date of Service: [name]. 04/05/2023 8:00 [name] M Medical Record Number: [id number] Patient Account Number: [id number] Date of Birth/Sex: Treating RN: 12/19/1960 (62 y.o. [name] Primary Care Provider: Junious [name] Other Clinician: Referring Provider: Treating Provider/Extender: [name] in Treatment: 64 T Contact Cast Applied for Wound Assessment: otal Wound #2 Left,Distal  Calcaneus Performed By: Physician [name] Corwin, DO Post Procedure Diagnosis Same as Pre-procedure Notes size 4 Electronic Signature(s) Signed: 04/05/2023 10:26:47 AM By: [name] Corwin DO Signed: 04/05/2023 4:45:04 PM By: [name] Stall RN, BSN Entered By: [name] on 04/05/2023 08:47:21 -------------------------------------------------------------------------------- SuperBill Details Patient Name: Date of Service: [name]. 04/05/2023 Medical Record Number: [id number] Patient Account Number: [id number] Date of Birth/Sex: Treating RN: [birth year] (61 y.o. [name], [name] Primary Care Provider: Junious [name] Other Clinician: Referring Provider: Treating Provider/Extender: [name] in Treatment: 56 Diagnosis Coding ICD-10 Codes Code Description [phone] Non-pressure chronic ulcer of other part of left foot with fat layer exposed E11.621 Type 2 diabetes mellitus with foot ulcer E66.01 Morbid (severe) obesity due to excess calories Facility Procedures : CPT4 Code: [id number] Description: 15275 - SKIN SUB GRAFT FACE/NK/HF/G ICD-10 Diagnosis Description L97.522 Non-pressure chronic ulcer of other part of left foot with fat layer exposed E11.621 Type 2 diabetes mellitus with foot ulcer Modifier: Quantity: 1 : CPT4 Code: 23557322 Description: Q4186 Epifix 18mm disk qty 3 Modifier: Quantity: 3 Physician Procedures Wroe, Prim [name] ([id number]): CPT4 Code Description [id number] 15275 - WC PHYS SKIN SUB GRAFT FACE/NK/HF/G ICD-10 Diagnosis Description L97.522 Non-pressure chronic ulcer of other part of left foot with fat E11.621 Type 2 diabetes mellitus with foot ulcer [id number] Page 12 of 12: Quantity Modifier 1 layer exposed Electronic Signature(s) Signed: 04/29/2023 8:22:44 AM By: Pearletha Alfred Signed: 04/29/2023 3:41:47 PM By: [name] Corwin DO Previous Signature: 04/05/2023 10:26:47 AM Version By: [name] Corwin  DO Entered By: Pearletha Alfred on 04/29/2023 08:22:43

## 2023-04-05 NOTE — Progress Notes (Signed)
Viles, Leandrea [name] ([id number]) [id number] Page 1 of 8 Visit Report for 04/05/2023 Arrival Information Details Patient Name: Date of Service: [name]. 04/05/2023 8:00 [name] M Medical Record Number: [id number] Patient Account Number: [id number] Date of Birth/Sex: Treating RN: [birth year] (62 y.o. [name], [name] Primary Care Jovannie Ulibarri: [name] Other Clinician: Referring Dare Sanger: Treating Daelon Dunivan/Extender: [name]6 Visit Information History Since Last Visit Added or deleted any medications: No Patient Arrived: Wheel Chair Any new allergies or adverse reactions: No Arrival Time: 08:03 Had [name] fall or experienced change in No Accompanied By: self activities of daily living that may affect Transfer Assistance: Manual risk of falls: Patient Identification Verified: Yes Signs or symptoms of abuse/neglect since last visito No Secondary Verification Process Completed: Yes Hospitalized since last visit: No Patient Requires Transmission-Based Precautions: No Implantable device outside of the clinic excluding No Patient Has Alerts: No cellular tissue based products placed in the center since last visit: Has Dressing in Place as Prescribed: Yes Has Footwear/Offloading in Place as Prescribed: Yes Left: T Contact Cast otal Pain Present Now: No Electronic Signature(s) Signed: 04/05/2023 4:45:04 PM By: Shawn Stall RN, BSN Entered By: Shawn Stall on 04/05/2023 08:06:07 -------------------------------------------------------------------------------- Encounter Discharge Information Details Patient Name: Date of Service: [name]. 04/05/2023 8:00 [name] M Medical Record Number: [id number] Patient Account Number: [id number] Date of Birth/Sex: Treating RN: [birth year] (61 y.o. [name] Primary Care Marylouise Mallet: [name] Other Clinician: Referring Tirso Laws: Treating Monnica Saltsman/Extender: [name]6 Encounter Discharge Information Items Post Procedure Vitals Discharge Condition: Stable Temperature (F): 98.7 Ambulatory Status: Wheelchair Pulse (bpm): 91 Discharge Destination: Skilled Nursing Facility Respiratory Rate (breaths/min): 20 Telephoned: No Blood Pressure (mmHg): 139/76 Orders Sent: Yes Transportation: Private Auto Accompanied By: self Schedule Follow-up Appointment: Yes Clinical Summary of Care: Electronic Signature(s) Signed: 04/05/2023 4:45:04 PM By: Shawn Stall RN, BSN Entered By: Shawn Stall on 04/05/2023 09:31:06 Glasco, [name] ([id number]) [id number] Page 2 of 8 -------------------------------------------------------------------------------- Lower Extremity Assessment Details Patient Name: Date of Service: [name]. 04/05/2023 8:00 [name] M Medical Record Number: [id number] Patient Account Number: [id number] Date of Birth/Sex: Treating RN: 04-09-1961 (62 y.o. [name], [name] Primary Care Zakaiya Lares: [name] Other Clinician: Referring Jasalyn Frysinger: Treating Alezander Dimaano/Extender: [name]6 Edema Assessment Assessed: [name]: No] [name]: No] Edema: [Left: Ye] [Right: s] Calf Left: Right: Point of Measurement: 28 cm From Medial Instep 47 cm Ankle Left: Right: Point of Measurement: 3 cm From Medial Instep 32 cm Vascular Assessment Pulses: Dorsalis Pedis Palpable: [Left:Yes] Electronic Signature(s) Signed: 04/05/2023 4:45:04 PM By: Shawn Stall RN, BSN Entered By: Shawn Stall on 04/05/2023 08:20:55 -------------------------------------------------------------------------------- Multi Wound Chart Details Patient Name: Date of Service: [name]. 04/05/2023 8:00 [name] M Medical Record Number: [id number] Patient Account Number: [id number] Date of Birth/Sex: Treating RN: 02/07/1961 (61 y.o. F) Primary Care Amias Hutchinson: [name] Other Clinician: Referring Jaquese Irving: Treating Loye Reininger/Extender: [name]6 Vital Signs Height(in): 66 Pulse(bpm): 91 Weight(lbs): 339 Blood Pressure(mmHg): 139/76 Body Mass Index(BMI): 54.7 Temperature(F): 98.7 Respiratory Rate(breaths/min): 20 [2:Photos:] [N/[name]:N/[name]] Left, Distal Calcaneus N/[name] N/[name] Wound Location: Blister N/[name] N/[name] Wounding Event: Diabetic Wound/Ulcer of the Lower N/[name] N/[name] Primary Etiology: Extremity Anemia, Chronic Obstructive N/[name] N/[name] Comorbid History: Pulmonary Disease (COPD), Congestive Heart Failure, Hypertension, Myocardial Infarction, Peripheral Venous Disease, Type II Diabetes, Neuropathy 06/15/2022 N/[name] N/[name] Date Acquired: 40 N/[name] N/[name] Weeks of Treatment:  Open N/[name] N/[name] Wound Status: No N/[name] N/[name] Wound Recurrence: 0.1x0.1x0.1 N/[name] N/[name] Measurements L x W x D (cm) 0.008 N/[name] N/[name] [name] (cm) : rea 0.001 N/[name] N/[name] Volume (cm) : 99.90% N/[name] N/[name] % Reduction in [name] rea: 99.90% N/[name] N/[name] % Reduction in Volume: Grade 2 N/[name] N/[name] Classification: None Present N/[name] N/[name] Exudate [name] mount: Distinct, outline attached N/[name] N/[name] Wound Margin: None Present (0%) N/[name] N/[name] Granulation [name] mount: None Present (0%) N/[name] N/[name] Necrotic [name] mount: Fascia: No N/[name] N/[name] Exposed Structures: Fat Layer (Subcutaneous Tissue): No Tendon: No Muscle: No Joint: No Bone: No Large (67-100%) N/[name] N/[name] Epithelialization: No Abnormalities Noted N/[name] N/[name] Periwound Skin Texture: Maceration: No N/[name] N/[name] Periwound Skin Moisture: Dry/Scaly: No No Abnormalities Noted N/[name] N/[name] Periwound Skin Color: No Abnormality N/[name] N/[name] Temperature: Cellular or Tissue Based Product N/[name] N/[name] Procedures Performed: T Contact Cast otal Treatment Notes Wound #2 (Calcaneus) Wound Laterality: Left, Distal Cleanser Soap and Water Discharge Instruction: May shower and wash wound with dial antibacterial soap and water prior to dressing change. Wound Cleanser Discharge  Instruction: Cleanse the wound with wound cleanser prior to applying [name] clean dressing using gauze sponges, not tissue or cotton balls. Peri-Wound Care Topical Primary Dressing epifix Discharge Instruction: applied by Lyman Balingit adaptic and steri-strips Discharge Instruction: secured the epifix Secondary Dressing ABD Pad, 8x10 Discharge Instruction: Apply over primary dressing as directed. Woven Gauze Sponge, Non-Sterile 4x4 in Discharge Instruction: Apply over primary dressing as directed. Secured With [employer], 4.5x3.1 (in/yd) Discharge Instruction: Secure with Kerlix as directed. Compression Wrap Compression Stockings Add-Ons TCC size 4 Discharge Instruction: last layer applied by physician. Imbert, Tonya [name] ([id number]) [id number] Page 4 of 8 Electronic Signature(s) Signed: 04/05/2023 10:26:47 AM By: Geralyn Corwin DO Entered By: Geralyn Corwin on 04/05/2023 09:49:16 -------------------------------------------------------------------------------- Multi-Disciplinary Care Plan Details Patient Name: Date of Service: [name], [name]. 04/05/2023 8:00 [name] M Medical Record Number: [id number] Patient Account Number: [id number] Date of Birth/Sex: Treating RN: August 06, 1961 (61 y.o. [name], [name] Primary Care Kaimana Neuzil: [name] Other Clinician: Referring Nida Manfredi: Treating Yuna Pizzolato/Extender: [name]3 Active Inactive Abuse / Safety / Falls / Self Care Management Nursing Diagnoses: History of Falls Goals: Patient will not experience any injury related to falls Date Initiated: 03/05/2022 Target Resolution Date: 04/30/2023 Goal Status: Active Interventions: Assess fall risk on admission and as needed Assess: immobility, friction, shearing, incontinence upon admission and as needed Assess impairment of mobility on admission and as needed per policy Notes: 04/30/22: Fall prevention ongoing,  recent fall. Wound/Skin Impairment Nursing Diagnoses: Impaired tissue integrity Goals: Patient/caregiver will verbalize understanding of skin care regimen Date Initiated: 03/05/2022 Target Resolution Date: 04/30/2023 Goal Status: Active Ulcer/skin breakdown will have [name] volume reduction of 30% by week 4 Date Initiated: 03/05/2022 Date Inactivated: 04/30/2022 Target Resolution Date: 05/02/2022 Goal Status: Met Ulcer/skin breakdown will have [name] volume reduction of 50% by week 8 Date Initiated: 04/30/2022 Date Inactivated: 07/07/2022 Target Resolution Date: 07/04/2022 Unmet Reason: patient sent three Goal Status: Unmet weeks inpatient and rehab before returning to wound center. Interventions: Assess patient/caregiver ability to obtain necessary supplies Assess patient/caregiver ability to perform ulcer/skin care regimen upon admission and as needed Assess ulceration(s) every visit Provide education on smoking Provide education on ulcer and skin care Treatment Activities: Topical wound management initiated : 03/05/2022 Notes: 04/30/22: Wound care regimen continues. Electronic Signature(s) Signed: 04/05/2023 4:45:04 PM By: Shawn Stall RN, BSN [name], [name] ([id number]) [id number] Page 5 of 8 Entered By: Elesa Hacker,  Bobbi on 04/05/2023 08:06:55 -------------------------------------------------------------------------------- Pain Assessment Details Patient Name: Date of Service: [name]. 04/05/2023 8:00 [name] M Medical Record Number: [id number] Patient Account Number: [id number] Date of Birth/Sex: Treating RN: 03/10/1961 (62 y.o. [name], [name] Primary Care Chenise Mulvihill: [name] Other Clinician: Referring Skylin Kennerson: Treating Daniah Zaldivar/Extender: [name]6 Active Problems Location of Pain Severity and Description of Pain Patient Has Paino No Site Locations Pain Management and Medication Current Pain  Management: Electronic Signature(s) Signed: 04/05/2023 4:45:04 PM By: Shawn Stall RN, BSN Entered By: Shawn Stall on 04/05/2023 08:06:23 -------------------------------------------------------------------------------- Patient/Caregiver Education Details Patient Name: Date of Service: [name] 7/1/2024andnbsp8:00 [name] M Medical Record Number: [id number] Patient Account Number: [id number] Date of Birth/Gender: Treating RN: 03-Jul-1961 (62 y.o. [name], [name] Primary Care Physician: [name] Other Clinician: Referring Physician: Treating Physician/Extender: [name]7 Education Assessment Education Provided To: Patient Education Topics Provided Wound/Skin ImpairmentValmai [name], [name] ([id number]) [id number] Page 6 of 8 Handouts: Caring for Your Ulcer Methods: Explain/Verbal Responses: Reinforcements needed Electronic Signature(s) Signed: 04/05/2023 4:45:04 PM By: Shawn Stall RN, BSN Entered By: Shawn Stall on 04/05/2023 08:07:10 -------------------------------------------------------------------------------- Wound Assessment Details Patient Name: Date of Service: [name]. 04/05/2023 8:00 [name] M Medical Record Number: [id number] Patient Account Number: [id number] Date of Birth/Sex: Treating RN: [birth year] (62 y.o. [name], [name] Primary Care Arend Bahl: [name] Other Clinician: Referring Aaleigha Bozza: Treating Chyenne Sobczak/Extender: [name]6 Wound Status Wound Number: 2 Primary Diabetic Wound/Ulcer of the Lower Extremity Etiology: Wound Location: Left, Distal Calcaneus Wound Open Wounding Event: Blister Status: Date Acquired: 06/15/2022 Comorbid Anemia, Chronic Obstructive Pulmonary Disease (COPD), Weeks Of Treatment: 40 History: Congestive Heart Failure, Hypertension, Myocardial Infarction, Clustered Wound: No Peripheral Venous  Disease, Type II Diabetes, Neuropathy Photos Wound Measurements Length: (cm) 0.1 Width: (cm) 0.1 Depth: (cm) 0.1 Area: (cm) 0.008 Volume: (cm) 0.001 % Reduction in Area: 99.9% % Reduction in Volume: 99.9% Epithelialization: Large (67-100%) Tunneling: No Undermining: No Wound Description Classification: Grade 2 Wound Margin: Distinct, outline attached Exudate Amount: None Present Foul Odor After Cleansing: No Slough/Fibrino No Wound Bed Granulation Amount: None Present (0%) Exposed Structure Necrotic Amount: None Present (0%) Fascia Exposed: No Fat Layer (Subcutaneous Tissue) Exposed: No Tendon Exposed: No Muscle Exposed: No Joint Exposed: No Bone Exposed: No Periwound Skin Texture Texture Color No Abnormalities Noted: Yes No Abnormalities Noted: Yes [name], [name] ([id number]) [id number] Page 7 of 8 Moisture Temperature / Pain No Abnormalities Noted: No Temperature: No Abnormality Dry / Scaly: No Maceration: No Treatment Notes Wound #2 (Calcaneus) Wound Laterality: Left, Distal Cleanser Soap and Water Discharge Instruction: May shower and wash wound with dial antibacterial soap and water prior to dressing change. Wound Cleanser Discharge Instruction: Cleanse the wound with wound cleanser prior to applying [name] clean dressing using gauze sponges, not tissue or cotton balls. Peri-Wound Care Topical Primary Dressing epifix Discharge Instruction: applied by Semaya V[employer] adaptic and steri-strips Discharge Instruction: secured the epifix Secondary Dressing ABD Pad, 8x10 Discharge Instruction: Apply over primary dressing as directed. Woven Gauze Sponge, Non-Sterile 4x4 in Discharge Instruction: Apply over primary dressing as directed. Secured With [employer], 4.5x3.1 (in/yd) Discharge Instruction: Secure with Kerlix as directed. Compression Wrap Compression Stockings Add-Ons TCC size 4 Discharge Instruction: last layer applied by  physician. Electronic Signature(s) Signed: 04/05/2023 4:45:04 PM By: Shawn Stall RN, BSN Entered By: Shawn Stall on 04/05/2023 08:22:16 -------------------------------------------------------------------------------- Vitals Details Patient Name:  Date of Service: [name]. 04/05/2023 8:00 [name] M Medical Record Number: [id number] Patient Account Number: [id number] Date of Birth/Sex: Treating RN: 17-Jun-1961 (62 y.o. [name], [name] Primary Care Soua Lenk: [name] Other Clinician: Referring Graciana Sessa: Treating Daaiyah Baumert/Extender: [name]6 Vital Signs Time Taken: 08:03 Temperature (F): 98.7 Height (in): 66 Pulse (bpm): 91 Weight (lbs): 339 Respiratory Rate (breaths/min): 20 Body Mass Index (BMI): 54.7 Blood Pressure (mmHg): 139/76 Reference Range: 80 - 120 mg / dl Electronic Signature(s) [name], [name] ([id number]) [id number] Page 8 of 8 Signed: 04/05/2023 4:45:04 PM By: Shawn Stall RN, BSN Entered By: Shawn Stall on 04/05/2023 08:06:18

## 2023-04-13 ENCOUNTER — Encounter (HOSPITAL_BASED_OUTPATIENT_CLINIC_OR_DEPARTMENT_OTHER): Payer: 59 | Admitting: Internal Medicine

## 2023-04-13 DIAGNOSIS — E11621 Type 2 diabetes mellitus with foot ulcer: Secondary | ICD-10-CM | POA: Diagnosis not present

## 2023-04-13 DIAGNOSIS — L97522 Non-pressure chronic ulcer of other part of left foot with fat layer exposed: Secondary | ICD-10-CM | POA: Diagnosis not present

## 2023-04-14 NOTE — Progress Notes (Signed)
[name], [name] ([id number]) [id number] Page 1 of 13 Visit Report for 04/13/2023 Chief Complaint Document Details Patient Name: Date of Service: [name]. 04/13/2023 8:00 [name] Medical Record Number: [id number] Patient Account Number: [id number] Date of Birth/Sex: Treating RN: 11-09-1960 (62 y.o. F) Primary Care Provider: [name] Other Clinician: Referring Provider: Treating Provider/Extender: [name]6 Information Obtained from: Patient Chief Complaint 03/05/2022; left foot wound Electronic Signature(s) Signed: 04/13/2023 2:56:31 PM By: [name] Corwin DO Entered By: [name] on 04/13/2023 09:45:04 -------------------------------------------------------------------------------- Cellular or Tissue Based Product Details Patient Name: Date of Service: [name], [name]. 04/13/2023 8:00 [name] Medical Record Number: [id number] Patient Account Number: [id number] Date of Birth/Sex: Treating RN: November 13, 1960 (62 y.o. [name], [name] Primary Care Provider: [name] Other Clinician: Referring Provider: Treating Provider/Extender: [name]7 Cellular or Tissue Based Product Type Wound #2 Left,Distal Calcaneus Applied to: Performed By: Physician [name] Corwin, DO Cellular or Tissue Based Product Type: Epifix Level of Consciousness (Pre-procedure): Awake and Alert Pre-procedure Verification/Time Out Yes - 08:55 Taken: Location: genitalia / hands / feet / multiple digits Wound Size (sq cm): 0.01 Product Size (sq cm): 6 Waste Size (sq cm): 4 Waste Reason: wound size Amount of Product Applied (sq cm): 2 Instrument Used: Forceps, Scissors Lot #: [id number] Order #: [id number] Expiration Date: 11/06/2027 Fenestrated: No Reconstituted: Yes Solution Type: Normal saline Solution Amount: 3 mL Lot #: [id number] Solution Expiration Date:  10/26/2024 Secured: Yes Secured With: Steri-Strips, adaptic Dressing Applied: No Procedural Pain: 0 Post Procedural Pain: 0 Response to Treatment: Procedure was tolerated well Level of Consciousness (Post- Awake and Alert [name], [name] ([id number]) [id number] Page 2 of 13 procedure): Post Procedure Diagnosis Same as Pre-procedure Electronic Signature(s) Signed: 04/13/2023 2:56:31 PM By: [name] Corwin DO Signed: 04/13/2023 5:26:31 PM By: [name] Stall RN, BSN Entered By: [name] on 04/13/2023 08:57:57 -------------------------------------------------------------------------------- Debridement Details Patient Name: Date of Service: [name], [name]. 04/13/2023 8:00 [name] Medical Record Number: [id number] Patient Account Number: [id number] Date of Birth/Sex: Treating RN: 1961/09/09 (62 y.o. [name], [name] Primary Care Provider: [name] Other Clinician: Referring Provider: Treating Provider/Extender: [name]7 Debridement Performed for Assessment: Wound #2 Left,Distal Calcaneus Performed By: Physician [name] Corwin, DO Debridement Type: Debridement Severity of Tissue Pre Debridement: Fat layer exposed Level of Consciousness (Pre-procedure): Awake and Alert Pre-procedure Verification/Time Out Yes - 08:45 Taken: Start Time: 08:46 Percent of Wound Bed Debrided: 100% T Area Debrided (cm): otal 0.03 Tissue and other material debrided: Non-Viable, Callus, Skin: Dermis , Skin: Epidermis Level: Skin/Epidermis Debridement Description: Selective/Open Wound Instrument: Curette Bleeding: None End Time: 08:50 Procedural Pain: 0 Post Procedural Pain: 0 Response to Treatment: Procedure was tolerated well Level of Consciousness (Post- Awake and Alert procedure): Post Debridement Measurements of Total Wound Length: (cm) 0.2 Width: (cm) 0.2 Depth: (cm) 0.1 Volume: (cm) 0.003 Character of  Wound/Ulcer Post Debridement: Improved Severity of Tissue Post Debridement: Fat layer exposed Post Procedure Diagnosis Same as Pre-procedure Electronic Signature(s) Signed: 04/13/2023 2:56:31 PM By: [name] Corwin DO Signed: 04/13/2023 5:26:31 PM By: [name] Stall RN, BSN Entered By: [name] on 04/13/2023 08:55:40 HPI Details -------------------------------------------------------------------------------- [name], [name] ([id number]) [id number] Page 3 of 13 Patient Name: Date of Service: [name]. 04/13/2023 8:00 [name] Medical Record Number: [id number] Patient Account Number: [id number] Date of Birth/Sex: Treating RN: Jun 07, 1961 (62 y.o. F) Primary Care Provider: Orvan Falconer,  [name] Other Clinician: Referring Provider: Treating Provider/Extender: [name] in Treatment: 77 History of Present Illness HPI Description: Admission 03/05/2022 [name] is [name] 62 year old female with [name] past medical history of uncontrolled insulin-dependent type 2 diabetes, tobacco user and chronic diastolic heart failure that presents to the clinic for [name] 31-month history of wound to her left heel. She states she had [name] left ankle fusion in September 2022. She states that she always had [name] wound after the surgery and it never healed. She has home health and they have been doing compression wraps along with silver alginate to the wound bed. She currently denies signs of infection. 6/8; this is [name] 62 year old woman with type 2 diabetes. She developed [name] wound on her left Achilles heel just above the tip of the heel in the setting of recurrent ankle surgeries in late 2022. I have seen some of these results from either the cast or the surgical boots that are put on after these operations. She thinks this may be the case. And [name] sense of pressure ulcer. In any case that she has been using Santyl Hydrofera Blue under compression. She is not wearing  any footwear at home as she cannot find anything to accommodate the wrap 6/22; patient presents for follow-up. She has home health that comes out once [name] week to help with dressing changes. She has no issues or complaints today. We have been using Hydrofera Blue under compression therapy. 7/6; patient presents for follow-up. She states that home health did not come out for the past 2 weeks. It is unclear why. We have been using Hydrofera Blue and Santyl under compression therapy. 7/13; patient presents for follow-up. She did not take the oral antibiotics prescribed at last clinic visit. We have been using Hydrofera Blue with gentamicin/mupirocin ointment under compression therapy. She has no issues or complaints today. She denies signs of infection. 7/20; patient presents for follow-up. We have been using Hydrofera Blue with antibiotic ointment under 3 layer compression. She has home health that change the dressing once. They put collagen on the wound bed. She also reports falling yesterday and hitting her right foot. She has no pain to the area today. 7/27; patient presents for follow-up. We have been using Hydrofera Blue under 3 layer compression. She has home health that changes the dressing. She has no issues or complaints today. 8/3; patient presents for follow-up. We continues to use Hydrofera Blue under 3 layer compression. She has no issues or complaints today. She has been approved for Epicord at 100%. 8/11; patient presents for follow-up. We have been using Hydrofera Blue under 3 layer compression. She has been approved for Epicord and we have this today. She is in agreement with having this applied. 8/18; patient presents for follow-up. Epicord #1 was placed in standard fashion last clinic visit. She has no issues or complaints today. 9/18; Unfortunately patient has missed her last clinic appointments due to falling and breaking her right ankle. We have been following her for her left  ankle wound. She has also developed [name] large blister to the left heel over the past week that has ruptured and dried. She currently resides In [facility]. Wound9/25; patient presents for follow-up. We have been using Hydrofera Blue and Santyl to the original left ankle wound and Xeroform to the dried blistered. We have been wrapping her with compression therapy. She resides in [name] facility and they changed the wrap once this past week. She has no issues  or complaints today. She denies signs of infection. 10/3; patient presents for follow-up. We have been using Xeroform to the heel wound and Epicort to the left ankle wound All under compression therapy. She has no issues or complaints today. 10/10; patient presents for follow-up. We have been using Epicort to the left ankle wound and Santyl and Hydrofera Blue to the heel wound. All under compression therapy. she has no issues or complaints today. 10/16; patient presents for follow-up. Epicort was placed in standard fashion to the superior wound and onto the heel and Santyl and Hydrofera Blue. She states that the wrap got wet during her shower and her facility was able to rewrap with Kerlix/Coban. 10/23; patient presents for follow-up. Epicord was placed to the wound beds last clinic visit. We continue Kerlix/Coban. She has no issues or complaints today. 10/31; Patient presents for follow-up. Epicord was placed to the wound beds last clinic visit. The original wound is almost healed. We have done this under Kerlix/Coban. She denies signs of infection. 11/10; patient presents for follow-up. Patient's last epi cord was placed in standard fashion at last clinic visit. The original wound has healed. She still has [name] heel wound. Grafix was approved however too costly for the patient. We will rerun epi cord to see if she can have more applications. She had done very well with this. We have been using Hydrofera Blue to the heel under Kerlix/Coban. 11/21;  patient presents for follow-up. We have been using Hydrofera Blue and Santyl to the heel under Kerlix/Coban. She switched to [name] new healthcare insurance and again The skin substitute is too costly for the patient. She denies signs of infection. 11/28; patient presents for follow-up. Her facility did not change the wrap last clinic visit due to lack of staffing. The wrap was taken off and Hydrofera Blue dressing has been in place for the past week. She has no issues or complaints today. 12/5; patient presents for follow-up. She states she has had more drainage over the past week. They did not change the wrap at her facility. She states the nurse will be back this week. She is also been doing [name] lot of physical therapy. She has been using the Prevalon boot while in bed. 12/12; patient presents for follow-up. We have been using Hydrofera Blue with antibiotic ointment under 4-layer compression. She is tolerated the wrap well. She states she is using her Prevalon boot while in the wheelchair. She has no issues or complaints today. There is been improvement in wound healing. 12/19; patient presents for follow-up. We have been using Hydrofera Blue and antibiotic ointment to the wound bed under 4-layer compression. Her facility change the wrap once. She states she has been using the Prevalon boot in bed but not in the wheelchair due to issues with transferring. Overall there is still improvement in wound healing. 1/16; patient presents for follow-up. She missed her last clinic appointment. We have been using Hydrofera Blue and antibiotic ointment under 4-layer compression. At the facility this has only been changed twice over the past 3 weeks. They are not using 4-layer compression. She came in with Kerlix/Coban. 1/23; patient presents for follow-up. We have been using Santyl and Hydrofera Blue under 4-layer compression. Patient has no issues or complaints today. [name], [name] ([id number])  [id number] Page 4 of 13 2/1; patient presents for follow-up. Patient's been using Santyl and Hydrofera Blue to the wound bed. She has developed more slough and now [name] mild odor. She states she is  wearing the Prevalon boot at night. It is unclear if she is offloading this area during the day. 2/13; patient presents for follow-up. Patient's been using Dakin's wet-to-dry dressings. She received her Keystone antibiotic ointment in the mail. She has not used this yet. She reports using her Prevalon boot. She has no issues or complaints today. 2/20; patient presents for follow-up. She has been using Keystone antibiotic ointment with Hydrofera Blue. She has no issues or complaints today. Due to transportation she cannot do the total contact cast today. She is scheduled for next week to have this placed. 2/27; patient presents for follow-up. Plan is for the total contact cast today. We have been using Keystone and Hydrofera Blue to the wound bed. She forgot her Keystone antibiotic ointment. She has no issues or complaints today. 2/29; patient presents for follow-up. Patient presents for her obligatory cast change. She has no issues or complaints today. 3/5; patient presents for follow-up. She has had the cast in place for the past week and has tolerated this well. She has no issues or complaints today. We have been using PolyMem with Keystone antibiotic ointment. 12/15/2022: The wound is quite clean and measured smaller today. 3/19; we have been using Keystone antibiotic ointment with PolyMem silver under the total contact cast. Overall there is been improvement in wound healing. 3/26; patient presents for follow-up. She is been approved for epi fix. She would like to proceed with this today. Previously we have been using Keystone antibiotic ointment with PolyMem silver under the total contact cast. She has no issues or complaints. 01/05/2023: The wound is clean and she is tolerating  total contact casting without difficulty. She had her first application of EpiFix last week. 4/9; patient presents for follow-up. We have been applying EpiFix under the total contact cast for the past 2 weeks. She is tolerated this well. She has no issues or complaints today. 4/16; patient presents for follow-up. We have been using EpiFix under the total contact cast. She has done well with this. Wound is smaller. She does have slough buildup. 4/23; patient presents for follow-up. We have been using EpiFix under the total contact cast. Wound appears well-healing. She does have slough buildup. 4/30; patient presents for follow-up. We have been using EpiFix under the total contact cast. Wound is smaller. She again has slough buildup. 5/6; patient with [name] left heel ulcer type II diabetic. We applied EpiFix #7 under [name] total contact cast. Her wound is making good progress. 5/14; patient presents for follow-up. We have been using EpiFix under the total contact casting wound is smaller. 5/21; patient presents for follow-up. We have been using EpiFix under the total contact cast. The wound is smaller. She has no issues or complaints today. 5/28; patient presents for follow-up. We have been using EpiFix under the total contact cast. Wound is smaller. 6/11; patient presents for follow-up. We have been using EpiFix under the total contact cast. Wound is stable. 6/17; patient presents for follow-up. We have been using EpiFix under the total contact cast. Wound is smaller. 6/25; patient presents for follow-up. We have been using EpiFix under the total contact cast. Wound continues to improve in size and is much smaller. 7/1; patient presents for follow-up. We have been using EpiFix under the total contact cast. Wound is almost healed. 7/9; patient presents for follow-up. We have been using EpiFix under the total contact cast. Wound is still present but very small. Electronic Signature(s) Signed: 04/13/2023 2:56:31  PM By: [name] Corwin  DO Entered By: [name] on 04/13/2023 09:45:51 -------------------------------------------------------------------------------- Physical Exam Details Patient Name: Date of Service: Sharol Harness MY [name]. 04/13/2023 8:00 [name] Medical Record Number: [id number] Patient Account Number: [id number] Date of Birth/Sex: Treating RN: 11-May-1961 (62 y.o. F) Primary Care Provider: [name] Other Clinician: Referring Provider: Treating Provider/Extender: [name]7 Constitutional respirations regular, non-labored and within target range for patient.. Cardiovascular 2+ dorsalis pedis/posterior tibialis pulses. Psychiatric pleasant and cooperative. [name], [name] ([id number]) [id number] Page 5 of 13 Notes Small open wound to the heel with granulation tissue and slough accumulation. No signs of surrounding infection. Electronic Signature(s) Signed: 04/13/2023 2:56:31 PM By: [name] Corwin DO Entered By: [name] on 04/13/2023 09:47:01 -------------------------------------------------------------------------------- Physician Orders Details Patient Name: Date of Service: [name], [name]. 04/13/2023 8:00 [name] Medical Record Number: [id number] Patient Account Number: [id number] Date of Birth/Sex: Treating RN: January 24, 1961 (62 y.o. [name], [name] Primary Care Provider: [name] Other Clinician: Referring Provider: Treating Provider/Extender: [name] in Treatment: 34 Verbal / Phone Orders: No Diagnosis Coding ICD-10 Coding Code Description [phone] Non-pressure chronic ulcer of other part of left foot with fat layer exposed E11.621 Type 2 diabetes mellitus with foot ulcer E66.01 Morbid (severe) obesity due to excess calories Follow-up Appointments ppointment in 1 week. - 04/19/2023 1245pm Dr. Mikey Bussing Return [name] Other: - Leave dressing in  place. Facility not to change dressing. Anesthetic (In clinic) Topical Lidocaine 5% applied to wound bed (In clinic) Topical Lidocaine 4% applied to wound bed Cellular or Tissue Based Products Cellular or Tissue Based Product Type: - Run IVR for EpiFix and Epicord=100% covered 05/07/22: Epicord ordered 12/08/2022: Run insurance for epicord and theraskin. 01/19/2023: Run for organogenesis- puraply AM daptic or Mepitel. (DO NOT REMOVE). - Cellular or Tissue Based Product applied to wound bed, secured with steri-strips, cover with [name] Epicord #1 05/15/25 Epicord #2 05/22/22 Epicord #3 06/29/2022 Epicord #4 07/07/2022 Epifix #5 07/14/22 Epicord #6 07/20/22 Epicord # 7 07/27/22 ***Run for Grafix***PENDING Epicord #8 08/04/2022 2024-IVR epifix approved 12/29/2022 #1 epifix applied to left heel. 01/05/2023 #2 epifix applied to left heel. 01/12/2023 #3 epifix applied to left heel 01/19/2023 #4 epifix applied to left heel. 01/26/23 Epifix #5 02/08/2023 #7 epifix 02/16/2023 #8 epifix 02/23/2023 #9 epifix 03/02/23 #10 epifix 03/09/2023 #11 epifix 18mm disk 03/16/2023 #12 epifix 18mm Disk 03/22/2023 #13 epifix 18mm DIsk 03/30/2023 #14 epifix 18mm Disk 04/05/2023 #15 epifix 18mm Disk 04/13/2023 #16 Epifix 2x3 Bathing/ Shower/ Hygiene May shower with protection but do not get wound dressing(s) wet. Protect dressing(s) with water repellant cover (for example, large plastic bag) or [name] cast cover and may then take shower. Edema Control - Lymphedema / SCD / Other Tantillo, Renny [name] ([id number]) [phone] Page 6 of 13 Elevate legs to the level of the heart or above for 30 minutes daily and/or when sitting for 3-4 times [name] day throughout the day. Avoid standing for long periods of time. Off-Loading Total Contact Cast to Left Lower Extremity - size 4 Additional Orders / Instructions Follow Nutritious Diet - Monitor/Control Blood Sugar Wound Treatment Wound #2 - Calcaneus Wound Laterality: Left,  Distal Cleanser: Soap and Water 1 x Per Week/30 Days Discharge Instructions: May shower and wash wound with dial antibacterial soap and water prior to dressing change. Cleanser: Wound Cleanser 1 x Per Week/30 Days Discharge Instructions: Cleanse the wound with wound cleanser prior to applying [name] clean dressing using gauze sponges, not tissue or cotton balls. Prim  Dressing: epifix 1 x Per Week/30 Days ary Discharge Instructions: placed by physician. leave in place. Secondary Dressing: ABD Pad, 8x10 1 x Per Week/30 Days Discharge Instructions: Apply over primary dressing as directed. Secondary Dressing: Woven Gauze Sponge, Non-Sterile 4x4 in (Home Health) 1 x Per Week/30 Days Discharge Instructions: Apply over primary dressing as directed. Secured With: [employer], 4.5x3.1 (in/yd) 1 x Per Week/30 Days Discharge Instructions: Secure with Kerlix as directed. Add-Ons: TCC size 4 1 x Per Week/30 Days Discharge Instructions: last layer applied by physician. Electronic Signature(s) Signed: 04/13/2023 2:56:31 PM By: [name] Corwin DO Entered By: [name] on 04/13/2023 09:47:13 -------------------------------------------------------------------------------- Problem List Details Patient Name: Date of Service: [name]. 04/13/2023 8:00 [name] Medical Record Number: [id number] Patient Account Number: [id number] Date of Birth/Sex: Treating RN: 06-Apr-1961 (62 y.o. [name], [name] Primary Care Provider: [name] Other Clinician: Referring Provider: Treating Provider/Extender: [name] in Treatment: 43 Active Problems ICD-10 Encounter Code Description Active Date MDM Diagnosis [id number] Non-pressure chronic ulcer of other part of left foot with fat layer exposed 03/05/2022 No Yes E11.621 Type 2 diabetes mellitus with foot ulcer 03/05/2022 No Yes E66.01 Morbid (severe) obesity due to excess calories 03/05/2022 No Yes Inactive Problems Felan,  Deashia [name] ([id number]) [id number] Page 7 of 13 Resolved Problems Electronic Signature(s) Signed: 04/13/2023 2:56:31 PM By: [name] Corwin DO Entered By: [name] on 04/13/2023 09:44:49 -------------------------------------------------------------------------------- Progress Note Details Patient Name: Date of Service: [name], [name]. 04/13/2023 8:00 [name] Medical Record Number: [id number] Patient Account Number: [id number] Date of Birth/Sex: Treating RN: 04-22-1961 (62 y.o. F) Primary Care Provider: [name] Other Clinician: Referring Provider: Treating Provider/Extender: [name] in Treatment: 4 Subjective Chief Complaint Information obtained from Patient 03/05/2022; left foot wound History of Present Illness (HPI) Admission 03/05/2022 Ms. Meilyn Madarang is [name] 62 year old female with [name] past medical history of uncontrolled insulin-dependent type 2 diabetes, tobacco user and chronic diastolic heart failure that presents to the clinic for [name] 49-month history of wound to her left heel. She states she had [name] left ankle fusion in September 2022. She states that she always had [name] wound after the surgery and it never healed. She has home health and they have been doing compression wraps along with silver alginate to the wound bed. She currently denies signs of infection. 6/8; this is [name] 62 year old woman with type 2 diabetes. She developed [name] wound on her left Achilles heel just above the tip of the heel in the setting of recurrent ankle surgeries in late 2022. I have seen some of these results from either the cast or the surgical boots that are put on after these operations. She thinks this may be the case. And [name] sense of pressure ulcer. In any case that she has been using Santyl Hydrofera Blue under compression. She is not wearing any footwear at home as she cannot find anything to accommodate the wrap 6/22; patient presents for  follow-up. She has home health that comes out once [name] week to help with dressing changes. She has no issues or complaints today. We have been using Hydrofera Blue under compression therapy. 7/6; patient presents for follow-up. She states that home health did not come out for the past 2 weeks. It is unclear why. We have been using Hydrofera Blue and Santyl under compression therapy. 7/13; patient presents for follow-up. She did not take the oral antibiotics prescribed at last clinic visit. We have been  using Hydrofera Blue with gentamicin/mupirocin ointment under compression therapy. She has no issues or complaints today. She denies signs of infection. 7/20; patient presents for follow-up. We have been using Hydrofera Blue with antibiotic ointment under 3 layer compression. She has home health that change the dressing once. They put collagen on the wound bed. She also reports falling yesterday and hitting her right foot. She has no pain to the area today. 7/27; patient presents for follow-up. We have been using Hydrofera Blue under 3 layer compression. She has home health that changes the dressing. She has no issues or complaints today. 8/3; patient presents for follow-up. We continues to use Hydrofera Blue under 3 layer compression. She has no issues or complaints today. She has been approved for Epicord at 100%. 8/11; patient presents for follow-up. We have been using Hydrofera Blue under 3 layer compression. She has been approved for Epicord and we have this today. She is in agreement with having this applied. 8/18; patient presents for follow-up. Epicord #1 was placed in standard fashion last clinic visit. She has no issues or complaints today. 9/18; Unfortunately patient has missed her last clinic appointments due to falling and breaking her right ankle. We have been following her for her left ankle wound. She has also developed [name] large blister to the left heel over the past week that has ruptured  and dried. She currently resides In [facility]. Wound9/25; patient presents for follow-up. We have been using Hydrofera Blue and Santyl to the original left ankle wound and Xeroform to the dried blistered. We have been wrapping her with compression therapy. She resides in [name] facility and they changed the wrap once this past week. She has no issues or complaints today. She denies signs of infection. 10/3; patient presents for follow-up. We have been using Xeroform to the heel wound and Epicort to the left ankle wound All under compression therapy. She has no issues or complaints today. 10/10; patient presents for follow-up. We have been using Epicort to the left ankle wound and Santyl and Hydrofera Blue to the heel wound. All under compression therapy. she has no issues or complaints today. 10/16; patient presents for follow-up. Epicort was placed in standard fashion to the superior wound and onto the heel and Santyl and Hydrofera Blue. She states that the wrap got wet during her shower and her facility was able to rewrap with Kerlix/Coban. 10/23; patient presents for follow-up. Epicord was placed to the wound beds last clinic visit. We continue Kerlix/Coban. She has no issues or complaints today. 10/31; Patient presents for follow-up. Epicord was placed to the wound beds last clinic visit. The original wound is almost healed. We have done this under Kerlix/Coban. She denies signs of infection. [name], [name] ([id number]) [id number] Page 8 of 13 11/10; patient presents for follow-up. Patient's last epi cord was placed in standard fashion at last clinic visit. The original wound has healed. She still has [name] heel wound. Grafix was approved however too costly for the patient. We will rerun epi cord to see if she can have more applications. She had done very well with this. We have been using Hydrofera Blue to the heel under Kerlix/Coban. 11/21; patient presents for  follow-up. We have been using Hydrofera Blue and Santyl to the heel under Kerlix/Coban. She switched to [name] new healthcare insurance and again The skin substitute is too costly for the patient. She denies signs of infection. 11/28; patient presents for follow-up. Her facility did not change the  wrap last clinic visit due to lack of staffing. The wrap was taken off and Hydrofera Blue dressing has been in place for the past week. She has no issues or complaints today. 12/5; patient presents for follow-up. She states she has had more drainage over the past week. They did not change the wrap at her facility. She states the nurse will be back this week. She is also been doing [name] lot of physical therapy. She has been using the Prevalon boot while in bed. 12/12; patient presents for follow-up. We have been using Hydrofera Blue with antibiotic ointment under 4-layer compression. She is tolerated the wrap well. She states she is using her Prevalon boot while in the wheelchair. She has no issues or complaints today. There is been improvement in wound healing. 12/19; patient presents for follow-up. We have been using Hydrofera Blue and antibiotic ointment to the wound bed under 4-layer compression. Her facility change the wrap once. She states she has been using the Prevalon boot in bed but not in the wheelchair due to issues with transferring. Overall there is still improvement in wound healing. 1/16; patient presents for follow-up. She missed her last clinic appointment. We have been using Hydrofera Blue and antibiotic ointment under 4-layer compression. At the facility this has only been changed twice over the past 3 weeks. They are not using 4-layer compression. She came in with Kerlix/Coban. 1/23; patient presents for follow-up. We have been using Santyl and Hydrofera Blue under 4-layer compression. Patient has no issues or complaints today. 2/1; patient presents for follow-up. Patient's been using Santyl and  Hydrofera Blue to the wound bed. She has developed more slough and now [name] mild odor. She states she is wearing the Prevalon boot at night. It is unclear if she is offloading this area during the day. 2/13; patient presents for follow-up. Patient's been using Dakin's wet-to-dry dressings. She received her Keystone antibiotic ointment in the mail. She has not used this yet. She reports using her Prevalon boot. She has no issues or complaints today. 2/20; patient presents for follow-up. She has been using Keystone antibiotic ointment with Hydrofera Blue. She has no issues or complaints today. Due to transportation she cannot do the total contact cast today. She is scheduled for next week to have this placed. 2/27; patient presents for follow-up. Plan is for the total contact cast today. We have been using Keystone and Hydrofera Blue to the wound bed. She forgot her Keystone antibiotic ointment. She has no issues or complaints today. 2/29; patient presents for follow-up. Patient presents for her obligatory cast change. She has no issues or complaints today. 3/5; patient presents for follow-up. She has had the cast in place for the past week and has tolerated this well. She has no issues or complaints today. We have been using PolyMem with Keystone antibiotic ointment. 12/15/2022: The wound is quite clean and measured smaller today. 3/19; we have been using Keystone antibiotic ointment with PolyMem silver under the total contact cast. Overall there is been improvement in wound healing. 3/26; patient presents for follow-up. She is been approved for epi fix. She would like to proceed with this today. Previously we have been using Keystone antibiotic ointment with PolyMem silver under the total contact cast. She has no issues or complaints. 01/05/2023: The wound is clean and she is tolerating total contact casting without difficulty. She had her first application of EpiFix last week. 4/9; patient presents for  follow-up. We have been applying EpiFix under the  total contact cast for the past 2 weeks. She is tolerated this well. She has no issues or complaints today. 4/16; patient presents for follow-up. We have been using EpiFix under the total contact cast. She has done well with this. Wound is smaller. She does have slough buildup. 4/23; patient presents for follow-up. We have been using EpiFix under the total contact cast. Wound appears well-healing. She does have slough buildup. 4/30; patient presents for follow-up. We have been using EpiFix under the total contact cast. Wound is smaller. She again has slough buildup. 5/6; patient with [name] left heel ulcer type II diabetic. We applied EpiFix #7 under [name] total contact cast. Her wound is making good progress. 5/14; patient presents for follow-up. We have been using EpiFix under the total contact casting wound is smaller. 5/21; patient presents for follow-up. We have been using EpiFix under the total contact cast. The wound is smaller. She has no issues or complaints today. 5/28; patient presents for follow-up. We have been using EpiFix under the total contact cast. Wound is smaller. 6/11; patient presents for follow-up. We have been using EpiFix under the total contact cast. Wound is stable. 6/17; patient presents for follow-up. We have been using EpiFix under the total contact cast. Wound is smaller. 6/25; patient presents for follow-up. We have been using EpiFix under the total contact cast. Wound continues to improve in size and is much smaller. 7/1; patient presents for follow-up. We have been using EpiFix under the total contact cast. Wound is almost healed. 7/9; patient presents for follow-up. We have been using EpiFix under the total contact cast. Wound is still present but very small. Patient History Information obtained from Patient, Chart. Family History Cancer - Father,Mother, Diabetes - Mother,Maternal Grandparents,Paternal Grandparents, Heart  Disease - Siblings,Maternal Grandparents, Hypertension - Siblings, Tuberculosis - Maternal Grandparents,Paternal Grandparents. Social History Current every day smoker, Marital Status - Single, Alcohol Use - Never, Drug Use - No History, Caffeine Use - Daily. 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 Vitals Time Taken: 8:05 AM, Height: 66 in, Weight: 339 lbs, BMI: 54.7, Temperature: 98.5 F, Pulse: 91 bpm, Respiratory Rate: 20 breaths/min, Blood Pressure: 130/75 mmHg, Capillary Blood Glucose: 132 mg/dl. Cardiovascular 2+ dorsalis pedis/posterior tibialis pulses. Psychiatric pleasant and cooperative. General Notes: Small open wound to the heel with granulation tissue and slough accumulation. No signs of surrounding infection. Integumentary (Hair, Skin) Wound #2 status is Open. Original cause of wound was Blister. The date acquired was: 06/15/2022. The wound has been in treatment 41 weeks. The wound is located on the Left,Distal Calcaneus. The wound measures 0.1cm length x 0.1cm width x 0.1cm depth; 0.008cm^2 area and 0.001cm^3 volume. There is no tunneling or undermining noted. There is [name] none present amount of drainage noted. The wound margin is distinct with the outline attached to the  wound base. There is no granulation within the wound bed. There is no necrotic tissue within the wound bed. The periwound skin appearance had no abnormalities noted  for texture. The periwound skin appearance had no abnormalities noted for color. The periwound skin appearance did not exhibit: Dry/Scaly, Maceration. Periwound temperature was noted as No Abnormality. Assessment Active Problems ICD-10 Non-pressure chronic ulcer of other part of left foot with fat layer exposed Type 2 diabetes mellitus with foot ulcer Morbid (severe) obesity due to excess calories Patient's wound appears well-healing. I debrided nonviable tissue. EpiFix was placed in standard fashion and the total contact cast was replaced today. Follow-up in 1 week. Procedures Wound #2 Pre-procedure diagnosis of Wound #2 is [name] Diabetic Wound/Ulcer of the Lower Extremity located on the Left,Distal Calcaneus .Severity of Tissue Pre Debridement is: Fat layer exposed. There was [name] Selective/Open Wound Skin/Epidermis Debridement with [name] total area of 0.03 sq cm performed by [name] Corwin, DO. With the following instrument(s): Curette to remove Non-Viable tissue/material. Material removed includes Callus, Skin: Dermis, and Skin: Epidermis. [name] time out was conducted at 08:45, prior to the start of the procedure. There was no bleeding. The procedure was tolerated well with [name] pain level of 0 throughout and [name] pain level of 0 following the procedure. Post Debridement Measurements: 0.2cm length x 0.2cm width x 0.1cm depth; 0.003cm^3 volume. Character of Wound/Ulcer Post Debridement is improved. Severity of Tissue Post Debridement is: Fat layer exposed. Post procedure Diagnosis Wound #2: Same as Pre-Procedure [name], [name] ([id number]) [id number] Page 10 of 13 Pre-procedure diagnosis of Wound #2 is [name] Diabetic Wound/Ulcer of the Lower Extremity located on the Left,Distal Calcaneus. [name] skin graft procedure using  [name] bioengineered skin substitute/cellular or tissue based product was performed by [name] Corwin, DO with the following instrument(s): Forceps and Scissors. Epifix was applied and secured with Steri-Strips and adaptic. 2 sq cm of product was utilized and 4 sq cm was wasted due to wound size. Post Application, no dressing was applied. [name] Time Out was conducted at 08:55, prior to the start of the procedure. The procedure was tolerated well with [name] pain level of 0 throughout and [name] pain level of 0 following the procedure. Post procedure Diagnosis Wound #2: Same as Pre-Procedure . Pre-procedure diagnosis of Wound #2 is [name] Diabetic Wound/Ulcer of the Lower Extremity located on the Left,Distal Calcaneus . There was [name] T Research scientist (life sciences) otal Procedure by [name] Corwin, DO. Post procedure Diagnosis Wound #2: Same as Pre-Procedure Notes: SIZE 4. Plan Follow-up Appointments: Return Appointment in 1 week. - 04/19/2023 1245pm Dr. Mikey Bussing Other: - Leave dressing in place. Facility not to change dressing. Anesthetic: (In clinic) Topical Lidocaine 5% applied to wound bed (In clinic) Topical Lidocaine 4% applied to wound bed Cellular or Tissue Based Products: Cellular or Tissue Based Product Type: - Run IVR for EpiFix and Epicord=100% covered 05/07/22: Epicord ordered 12/08/2022: Run insurance for epicord and theraskin. 01/19/2023: Run for organogenesis- puraply AM Cellular or Tissue Based Product applied to wound bed, secured with steri-strips, cover with Adaptic or Mepitel. (DO NOT REMOVE). - Epicord #1 05/15/25 Epicord #2 05/22/22 Epicord #3 06/29/2022 Epicord #4 07/07/2022 Epifix #5 07/14/22 Epicord #6 07/20/22 Epicord # 7 07/27/22 ***Run for Grafix***PENDING Epicord #8 08/04/2022 2024-IVR epifix approved 12/29/2022 #1 epifix applied to left heel. 01/05/2023 #2 epifix applied to left heel. 01/12/2023 #3 epifix applied to left heel 01/19/2023 #4 epifix applied to left heel. 01/26/23 Epifix #5 02/08/2023 #7 epifix 02/16/2023 #8  epifix 02/23/2023 #9 epifix 03/02/23 #10 epifix 03/09/2023 #11 epifix 18mm disk 03/16/2023 #12 epifix 18mm Disk 03/22/2023 #13 epifix 18mm DIsk 03/30/2023 #14 epifix 18mm Disk 04/05/2023 #15 epifix 18mm Disk  04/13/2023 #16 Epifix 2x3 Bathing/ Shower/ Hygiene: May shower with protection but do not get wound dressing(s) wet. Protect dressing(s) with water repellant cover (for example, large plastic bag) or [name] cast cover and may then take shower. Edema Control - Lymphedema / SCD / Other: Elevate legs to the level of the heart or above for 30 minutes daily and/or when sitting for 3-4 times [name] day throughout the day. Avoid standing for long periods of time. Off-Loading: T Contact Cast to Left Lower Extremity - size 4 otal Additional Orders / Instructions: Follow Nutritious Diet - Monitor/Control Blood Sugar WOUND #2: - Calcaneus Wound Laterality: Left, Distal Cleanser: Soap and Water 1 x Per Week/30 Days Discharge Instructions: May shower and wash wound with dial antibacterial soap and water prior to dressing change. Cleanser: Wound Cleanser 1 x Per Week/30 Days Discharge Instructions: Cleanse the wound with wound cleanser prior to applying [name] clean dressing using gauze sponges, not tissue or cotton balls. Prim Dressing: epifix 1 x Per Week/30 Days ary Discharge Instructions: placed by physician. leave in place. Secondary Dressing: ABD Pad, 8x10 1 x Per Week/30 Days Discharge Instructions: Apply over primary dressing as directed. Secondary Dressing: Woven Gauze Sponge, Non-Sterile 4x4 in (Home Health) 1 x Per Week/30 Days Discharge Instructions: Apply over primary dressing as directed. Secured With: [employer], 4.5x3.1 (in/yd) 1 x Per Week/30 Days Discharge Instructions: Secure with Kerlix as directed. Add-Ons: TCC size 4 1 x Per Week/30 Days Discharge Instructions: last layer applied by physician. 1. In office sharp debridement 2. EpiFix placed in standard fashion 3. T contact cast placed in  standard fashion to the left lower extremity otal 4. Follow-up in 1 week Electronic Signature(s) Signed: 04/13/2023 2:56:31 PM By: [name] Corwin DO Entered By: [name] on 04/13/2023 09:48:17 -------------------------------------------------------------------------------- HxROS Details Patient Name: Date of Service: [name], [name]. 04/13/2023 8:00 [name] Medical Record Number: [id number] Patient Account Number: [id number] Date of Birth/Sex: Treating RN: April 29, 1961 (62 y.o. F) [name], [name] ([id number]) [id number] Page 11 of 13 Primary Care Provider: [name] Other Clinician: Referring Provider: Treating Provider/Extender: [name] in Treatment: 78 Information Obtained From Patient Chart Hematologic/Lymphatic Medical History: Positive for: Anemia Respiratory Medical History: Positive for: Chronic Obstructive Pulmonary Disease (COPD) Cardiovascular Medical History: Positive for: Congestive Heart Failure - Weighs self daily; Hypertension; Myocardial Infarction; Peripheral Venous Disease Gastrointestinal Medical History: Past Medical History Notes: GERD, Peptic Ulcer Disease Endocrine Medical History: Positive for: Type II Diabetes Time with diabetes: 16 years Treated with: Insulin, Oral agents Blood sugar tested every day: Yes Tested : Twice daily Musculoskeletal Medical History: Past Medical History Notes: Broke left leg 3 years ago, 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, foot fused Neurologic Medical History: Positive for: Neuropathy Psychiatric Medical History: Past Medical History Notes: Depression Immunizations Pneumococcal Vaccine: Received Pneumococcal Vaccination: No Implantable Devices None Family and Social History Cancer: Yes - Father,Mother; Diabetes: Yes - Mother,Maternal Grandparents,Paternal Grandparents; Heart Disease: Yes - Siblings,Maternal Grandparents; Hypertension: Yes - Siblings;  Tuberculosis: Yes - Maternal Grandparents,Paternal Grandparents; Current every day smoker; Marital Status - Single; Alcohol Use: Never; Drug Use: No History; Caffeine Use: Daily; Financial Concerns: No; Food, Clothing or Shelter Needs: No; Support System Lacking: No; Transportation Concerns: No Electronic Signature(s) Signed: 04/13/2023 2:56:31 PM By: [name] Corwin DO Entered By: [name] on 04/13/2023 09:45:57 [name], [name] ([id number]) [id number] Page 12 of 13 -------------------------------------------------------------------------------- Total Contact Cast Details Patient Name: Date of Service: [name] N, [name]. 04/13/2023 8:00 [name] Medical Record  Number: [id number] Patient Account Number: [id number] Date of Birth/Sex: Treating RN: 17-Jun-1961 (62 y.o. [name] Primary Care Provider: [name] Other Clinician: Referring Provider: Treating Provider/Extender: [name] in Treatment: 21 T Contact Cast Applied for Wound Assessment: otal Wound #2 Left,Distal Calcaneus Performed By: Physician [name] Corwin, DO Post Procedure Diagnosis Same as Pre-procedure Notes SIZE 4 Electronic Signature(s) Signed: 04/13/2023 2:56:31 PM By: [name] Corwin DO Signed: 04/13/2023 5:26:31 PM By: [name] Stall RN, BSN Entered By: [name] on 04/13/2023 08:23:59 -------------------------------------------------------------------------------- SuperBill Details Patient Name: Date of Service: [name]. 04/13/2023 Medical Record Number: [id number] Patient Account Number: [id number] Date of Birth/Sex: Treating RN: 09-14-1961 (61 y.o. [name] Primary Care Provider: [name] Other Clinician: Referring Provider: Treating Provider/Extender: [name]7 Diagnosis Coding ICD-10 Codes Code Description [id number] Non-pressure chronic ulcer of other part of  left foot with fat layer exposed E11.621 Type 2 diabetes mellitus with foot ulcer E66.01 Morbid (severe) obesity due to excess calories Facility Procedures : CPT4 Code: 84132440 Description: Q4186 Epifix 2cm x 3cm Modifier: Quantity: 6 : CPT4 Code: 10272536 Description: 15275 - SKIN SUB GRAFT FACE/NK/HF/G ICD-10 Diagnosis Description L97.522 Non-pressure chronic ulcer of other part of left foot with fat layer exposed E11.621 Type 2 diabetes mellitus with foot ulcer Modifier: Quantity: 1 Physician Procedures Electronic Signature(s) Signed: 04/13/2023 2:56:31 PM By: [name] Corwin DO Entered By: [name] on 04/13/2023 09:49:00

## 2023-04-14 NOTE — Progress Notes (Signed)
Bubb, Kassondra C ([id number]) [id number] Page 1 of 8 Visit Report for 04/13/2023 Arrival Information Details Patient Name: Date of Service: [name] 04/13/2023 8:00 A M Medical Record Number: [id number] Patient Account Number: [id number] Date of Birth/Sex: Treating RN: 11/26/1960 (62 y.o. [name], [name] Primary Care Levaughn Puccinelli: [name] Other Clinician: Referring Averey Koning: Treating Aizen Duval/Extender: [name] in Treatment: 95 Visit Information History Since Last Visit Added or deleted any medications: No Patient Arrived: Wheel Chair Any new allergies or adverse reactions: No Arrival Time: 08:00 Had a fall or experienced change in No Accompanied By: self activities of daily living that may affect Transfer Assistance: None risk of falls: Patient Identification Verified: Yes Signs or symptoms of abuse/neglect since last visito No Secondary Verification Process Completed: Yes Hospitalized since last visit: No Patient Requires Transmission-Based Precautions: No Implantable device outside of the clinic excluding No Patient Has Alerts: No cellular tissue based products placed in the center since last visit: Has Dressing in Place as Prescribed: Yes Has Footwear/Offloading in Place as Prescribed: Yes Left: T Contact Cast otal Pain Present Now: No Electronic Signature(s) Signed: 04/13/2023 5:26:31 PM By: Shawn Stall RN, BSN Entered By: Shawn Stall on 04/13/2023 08:21:18 -------------------------------------------------------------------------------- Encounter Discharge Information Details Patient Name: Date of Service: [name] 04/13/2023 8:00 A M Medical Record Number: [id number] Patient Account Number: [id number] Date of Birth/Sex: Treating RN: [birth year] (61 y.o. [name] Primary Care Kemaria Dedic: [name] Other Clinician: Referring Zabdi Mis: Treating Shermika Balthaser/Extender: [name] in Treatment: 61 Encounter Discharge Information Items Post Procedure Vitals Discharge Condition: Stable Temperature (F): 98.5 Ambulatory Status: Wheelchair Pulse (bpm): 91 Discharge Destination: Home Respiratory Rate (breaths/min): 20 Transportation: Private Auto Blood Pressure (mmHg): 130/75 Accompanied By: self Schedule Follow-up Appointment: Yes Clinical Summary of Care: Electronic Signature(s) Signed: 04/13/2023 5:26:31 PM By: Shawn Stall RN, BSN Entered By: Shawn Stall on 04/13/2023 08:59:44 Ledyard, Joyce C ([id number]) [id number] Page 2 of 8 -------------------------------------------------------------------------------- Lower Extremity Assessment Details Patient Name: Date of Service: [name] 04/13/2023 8:00 A M Medical Record Number: [id number] Patient Account Number: [id number] Date of Birth/Sex: Treating RN: Apr 13, 1961 (62 y.o. [name], [name] Primary Care Janeisha Ryle: [name] Other Clinician: Referring Oriya Kettering: Treating Cristen Murcia/Extender: [name]7 Edema Assessment Assessed: [name]: Yes] [name]: No] Edema: [Left: Ye] [Right: s] Calf Left: Right: Point of Measurement: 28 cm From Medial Instep 47.5 cm Ankle Left: Right: Point of Measurement: 3 cm From Medial Instep 30 cm Vascular Assessment Pulses: Dorsalis Pedis Palpable: [Left:Yes] Electronic Signature(s) Signed: 04/13/2023 5:26:31 PM By: Shawn Stall RN, BSN Entered By: Shawn Stall on 04/13/2023 08:21:58 -------------------------------------------------------------------------------- Multi Wound Chart Details Patient Name: Date of Service: [name] 04/13/2023 8:00 A M Medical Record Number: [id number] Patient Account Number: [id number] Date of Birth/Sex: Treating RN: [birth year] (61 y.o. 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 Anemia, Chronic Obstructive N/A N/A Comorbid History: Pulmonary Disease (COPD), Congestive Heart Failure, Hypertension, Myocardial Infarction, Peripheral Venous Disease, Type II Diabetes, Neuropathy 06/15/2022 N/A N/A Date Acquired:  41 N/A N/A Weeks of Treatment: Open N/A N/A Wound Status: No N/A N/A Wound Recurrence: 0.1x0.1x0.1 N/A N/A Measurements L x W x D (cm) 0.008 N/A N/A A (cm) : rea 0.001 N/A N/A Volume (cm) : 99.90% N/A N/A % Reduction in A rea: 99.90% N/A N/A % Reduction in Volume: Grade 2 N/A N/A Classification: None Present N/A N/A Exudate A mount: Distinct, outline attached N/A N/A Wound Margin: None Present (0%) N/A N/A Granulation A mount: None Present (0%) N/A N/A Necrotic A mount: Fascia: No N/A N/A Exposed Structures: Fat Layer (Subcutaneous Tissue): No Tendon: No Muscle: No Joint: No Bone: No Large (67-100%) N/A N/A Epithelialization: Debridement - Selective/Open Wound N/A N/A Debridement: Pre-procedure Verification/Time Out 08:45 N/A N/A Taken: Callus N/A N/A Tissue Debrided: Skin/Epidermis N/A N/A Level: 0.03 N/A N/A Debridement A (sq cm): rea Curette N/A N/A Instrument: None N/A N/A Bleeding: 0 N/A N/A Procedural Pain: 0 N/A N/A Post Procedural Pain: Procedure was tolerated well N/A N/A Debridement Treatment Response: 0.2x0.2x0.1 N/A N/A Post Debridement Measurements  L x W x D (cm) 0.003 N/A N/A Post Debridement Volume: (cm) No Abnormalities Noted N/A N/A Periwound Skin Texture: Maceration: No N/A N/A Periwound Skin Moisture: Dry/Scaly: No No Abnormalities Noted N/A N/A Periwound Skin Color: No Abnormality N/A N/A Temperature: Cellular or Tissue Based Product N/A N/A Procedures Performed: Debridement T Contact Cast otal Treatment Notes Wound #2 (Calcaneus) Wound Laterality: Left, Distal Cleanser Soap and Water Discharge Instruction: May shower and wash wound with dial antibacterial soap and water prior to dressing change. Wound Cleanser Discharge Instruction: Cleanse the wound with wound cleanser prior to applying a clean dressing using gauze sponges, not tissue or cotton balls. Peri-Wound Care Topical Primary Dressing epifix Discharge Instruction: placed by physician. leave in place. Secondary Dressing ABD Pad, 8x10 Discharge Instruction: Apply over primary dressing as directed. Woven Gauze Sponge, Non-Sterile 4x4 in Discharge Instruction: Apply over primary dressing as directed. Secured With [employer], 4.5x3.1 (in/yd) Discharge Instruction: Secure with Kerlix as directed. Compression Wrap Erker, Jasie C ([id number]) [id number].pdf Page 4 of 8 Compression Stockings Add-Ons TCC size 4 Discharge Instruction: last layer applied by physician. Electronic Signature(s) Signed: 04/13/2023 2:56:31 PM By: Geralyn Corwin DO Entered By: Geralyn Corwin on 04/13/2023 09:44:56 -------------------------------------------------------------------------------- Multi-Disciplinary Care Plan Details Patient Name: Date of Service: [name] 04/13/2023 8:00 A M Medical Record Number: [id number] Patient Account Number: [id number] Date of Birth/Sex: Treating RN: 1961-07-07 (62 y.o. [name], [name] Primary Care Dariya Gainer: [name] Other Clinician: Referring Elhadj Girton: Treating Karthikeya Funke/Extender:  [name] in Treatment: 25 Active Inactive Abuse / Safety / Falls / Self Care Management Nursing Diagnoses: History of Falls Goals: Patient will not experience any injury related to falls Date Initiated: 03/05/2022 Target Resolution Date: 04/30/2023 Goal Status: Active Interventions: Assess fall risk on admission and as needed Assess: immobility, friction, shearing, incontinence upon admission and as needed Assess impairment of mobility on admission and as needed per policy Notes: 04/30/22: Fall prevention ongoing, recent fall. Wound/Skin Impairment Nursing Diagnoses: Impaired tissue integrity Goals: Patient/caregiver will verbalize understanding of skin care regimen Date Initiated: 03/05/2022 Target Resolution Date: 04/30/2023 Goal Status: Active Ulcer/skin breakdown will have a volume reduction of 30% by week 4 Date Initiated: 03/05/2022 Date Inactivated: 04/30/2022 Target Resolution Date: 05/02/2022 Goal Status: Met Ulcer/skin breakdown will have a volume reduction of 50% by week 8 Date Initiated: 04/30/2022 Date Inactivated: 07/07/2022 Target Resolution Date: 07/04/2022 Unmet Reason: patient sent three Goal Status: Unmet weeks inpatient  and rehab before returning to wound center. Interventions: Assess patient/caregiver ability to obtain necessary supplies Assess patient/caregiver ability to perform ulcer/skin care regimen upon admission and as needed Assess ulceration(s) every visit Provide education on smoking Provide education on ulcer and skin care Treatment Activities: Tissue, Kenyada C ([id number]) [phone].pdf Page 5 of 8 Topical wound management initiated : 03/05/2022 Notes: 04/30/22: Wound care regimen continues. Electronic Signature(s) Signed: 04/13/2023 5:26:31 PM By: Shawn Stall RN, BSN Entered By: Shawn Stall on 04/13/2023  08:23:23 -------------------------------------------------------------------------------- Pain Assessment Details Patient Name: Date of Service: HUFFMA [name], [name]. 04/13/2023 8:00 A M Medical Record Number: [id number] Patient Account Number: [id number] Date of Birth/Sex: Treating RN: 1961-04-16 (62 y.o. [name], [name] Primary Care Akili Cuda: [name] Other Clinician: Referring Cooper Stamp: Treating Shelisa Fern/Extender: [name] in Treatment: 47 Active Problems Location of Pain Severity and Description of Pain Patient Has Paino No Site Locations Pain Management and Medication Current Pain Management: Electronic Signature(s) Signed: 04/13/2023 5:26:31 PM By: Shawn Stall RN, BSN Entered By: Shawn Stall on 04/13/2023 08:21:48 -------------------------------------------------------------------------------- Patient/Caregiver Education Details Patient Name: Date of Service: [name] 7/9/2024andnbsp8:00 A M Medical Record Number: [id number] Patient Account Number: [id number] Date of Birth/Gender: Treating RN: 1961/07/29 (62 y.o. [name], [name] Primary Care Physician: [name] Other Clinician: Referring Physician: Treating Physician/Extender: [name] in Treatment: [address], [place] C ([id number]) [id number] Page 6 of 8 Education Assessment Education Provided To: Patient Education Topics Provided Wound/Skin Impairment: Handouts: Caring for Your Ulcer Methods: Explain/Verbal Responses: Reinforcements needed Electronic Signature(s) Signed: 04/13/2023 5:26:31 PM By: Shawn Stall RN, BSN Entered By: Shawn Stall on 04/13/2023 08:23:36 -------------------------------------------------------------------------------- Wound Assessment Details Patient Name: Date of Service: [name] 04/13/2023 8:00 A M Medical Record Number: [id number] Patient Account Number:  [id number] Date of Birth/Sex: Treating RN: 1961/08/10 (62 y.o. [name], [name] Primary Care Georg Ang: [name] Other Clinician: Referring Sua Spadafora: Treating Matteus Mcnelly/Extender: [name]7 Wound Status Wound Number: 2 Primary Diabetic Wound/Ulcer of the Lower Extremity Etiology: Wound Location: Left, Distal Calcaneus Wound Open Wounding Event: Blister Status: Date Acquired: 06/15/2022 Comorbid Anemia, Chronic Obstructive Pulmonary Disease (COPD), Weeks Of Treatment: 41 History: Congestive Heart Failure, Hypertension, Myocardial Infarction, Clustered Wound: No Peripheral Venous Disease, Type II Diabetes, Neuropathy Photos Wound Measurements Length: (cm) 0.1 Width: (cm) 0.1 Depth: (cm) 0.1 Area: (cm) 0.008 Volume: (cm) 0.001 % Reduction in Area: 99.9% % Reduction in Volume: 99.9% Epithelialization: Large (67-100%) Tunneling: No Undermining: No Wound Description Classification: Grade 2 Wound Margin: Distinct, outline attached Exudate Amount: None Present Foul Odor After Cleansing: No Slough/Fibrino No Wound Bed Granulation Amount: None Present (0%) Exposed Structure Necrotic Amount: None Present (0%) Fascia Exposed: No Fat Layer (Subcutaneous Tissue) Exposed: No Gunawan, Onetha C ([id number]) [id number] Page 7 of 8 Tendon Exposed: No Muscle Exposed: No Joint Exposed: No Bone Exposed: No Periwound Skin Texture Texture Color No Abnormalities Noted: Yes No Abnormalities Noted: Yes Moisture Temperature / Pain No Abnormalities Noted: No Temperature: No Abnormality Dry / Scaly: No Maceration: No Treatment Notes Wound #2 (Calcaneus) Wound Laterality: Left, Distal Cleanser Soap and Water Discharge Instruction: May shower and wash wound with dial antibacterial soap and water prior to dressing change. Wound Cleanser Discharge Instruction: Cleanse the wound with wound cleanser prior to  applying a clean dressing using gauze sponges, not tissue or cotton balls. Peri-Wound Care Topical Primary Dressing epifix Discharge Instruction: placed by physician. leave in place. Secondary Dressing ABD Pad,  8x10 Discharge Instruction: Apply over primary dressing as directed. Woven Gauze Sponge, Non-Sterile 4x4 in Discharge Instruction: Apply over primary dressing as directed. Secured With [employer], 4.5x3.1 (in/yd) Discharge Instruction: Secure with Kerlix as directed. Compression Wrap Compression Stockings Add-Ons TCC size 4 Discharge Instruction: last layer applied by physician. Electronic Signature(s) Signed: 04/13/2023 5:26:31 PM By: Shawn Stall RN, BSN Entered By: Shawn Stall on 04/13/2023 08:44:08 -------------------------------------------------------------------------------- Vitals Details Patient Name: Date of Service: [name], [name]. 04/13/2023 8:00 A M Medical Record Number: [id number] Patient Account Number: [id number] Date of Birth/Sex: Treating RN: July 07, 1961 (62 y.o. [name] Primary Care Abed Schar: [name] Other Clinician: Referring Yostin Malacara: Treating Skeeter Sheard/Extender: [name]7 Vital Signs Time Taken: 08:05 Temperature (F): 98.5 Height (in): 66 Pulse (bpm): 91 Aderman, Sharnelle C ([id number]) [phone].pdf Page 8 of 8 Weight (lbs): 339 Respiratory Rate (breaths/min): 20 Body Mass Index (BMI): 54.7 Blood Pressure (mmHg): 130/75 Capillary Blood Glucose (mg/dl): 151 Reference Range: 80 - 120 mg / dl Electronic Signature(s) Signed: 04/13/2023 5:26:31 PM By: Shawn Stall RN, BSN Entered By: Shawn Stall on 04/13/2023 76:16:07

## 2023-04-19 ENCOUNTER — Encounter (HOSPITAL_BASED_OUTPATIENT_CLINIC_OR_DEPARTMENT_OTHER): Payer: 59 | Admitting: Internal Medicine

## 2023-04-19 DIAGNOSIS — L97522 Non-pressure chronic ulcer of other part of left foot with fat layer exposed: Secondary | ICD-10-CM

## 2023-04-19 DIAGNOSIS — E11621 Type 2 diabetes mellitus with foot ulcer: Secondary | ICD-10-CM | POA: Diagnosis not present

## 2023-04-20 NOTE — Progress Notes (Addendum)
[address], [address] [name] ([id number]) [id number] Page 1 of 13 Visit Report for 04/19/2023 Chief Complaint Document Details Patient Name: Date of Service: [name] 04/19/2023 12:45 PM Medical Record Number: [id number] Patient Account Number: [id number] Date of Birth/Sex: Treating RN: 1961-09-06 (62 y.o. F) Primary Care Provider: [name] Other Clinician: Referring Provider: Treating Provider/Extender: [name] in Treatment: 40 Information Obtained from: Patient Chief Complaint 03/05/2022; left foot wound Electronic Signature(s) Signed: 04/20/2023 4:51:26 PM By: Geralyn Corwin DO Entered By: Geralyn Corwin on 04/19/2023 13:32:15 -------------------------------------------------------------------------------- Cellular or Tissue Based Product Details Patient Name: Date of Service: [name] Harness MY [name]. 04/19/2023 12:45 PM Medical Record Number: [id number] Patient Account Number: [id number] Date of Birth/Sex: Treating RN: 01-Aug-1961 (61 y.o. F) Primary Care Provider: [name] Other Clinician: Referring Provider: Treating Provider/Extender: [name]8 Cellular or Tissue Based Product Type Wound #2 Left,Distal Calcaneus Applied to: Performed By: Physician Geralyn Corwin, DO Cellular or Tissue Based Product Type: Epifix Level of Consciousness (Pre-procedure): Awake and Alert Pre-procedure Verification/Time Out Yes - 13:21 Taken: Location: genitalia / hands / feet / multiple digits Wound Size (sq cm): 0.01 Product Size (sq cm): 3 Waste Size (sq cm): 0 Amount of Product Applied (sq cm): 3 Instrument Used: Forceps, Scissors Lot #: [id number] Expiration Date: 11/06/2027 Fenestrated: No Reconstituted: Yes Solution Type: normal saline Solution Amount: 2 ml Lot #: [id number] Solution Expiration Date: 03/04/2025 Secured: Yes Secured With: Steri-Strips Dressing  Applied: Yes Primary Dressing: adaptic Procedural Pain: 0 Post Procedural Pain: 0 Response to Treatment: Procedure was tolerated well Level of Consciousness (Post- Awake and Alert procedure): [name], [name] ([id number]) [id number] Page 2 of 13 Post Procedure Diagnosis Same as Pre-procedure Notes scribed for Dr. Mikey Bussing by Samuella Bruin, RN Electronic Signature(s) Signed: 04/29/2023 11:03:24 AM By: Pearletha Alfred Signed: 04/29/2023 3:41:47 PM By: Geralyn Corwin DO Previous Signature: 04/19/2023 4:41:34 PM Version By: Samuella Bruin Previous Signature: 04/20/2023 4:51:26 PM Version By: Geralyn Corwin DO Entered By: Pearletha Alfred on 04/29/2023 11:03:23 -------------------------------------------------------------------------------- Debridement Details Patient Name: Date of Service: [name], [name]. 04/19/2023 12:45 PM Medical Record Number: [id number] Patient Account Number: [id number] Date of Birth/Sex: Treating RN: July 09, 1961 (61 y.o. Fredderick Phenix Primary Care Provider: [name] Other Clinician: Referring Provider: Treating Provider/Extender: [name]8 Debridement Performed for Assessment: Wound #2 Left,Distal Calcaneus Performed By: Physician Geralyn Corwin, DO Debridement Type: Debridement Severity of Tissue Pre Debridement: Fat layer exposed Level of Consciousness (Pre-procedure): Awake and Alert Pre-procedure Verification/Time Out Yes - 13:20 Taken: Start Time: 13:20 Pain Control: Lidocaine 5% topical ointment Percent of Wound Bed Debrided: 100% T Area Debrided (cm): otal 0.01 Tissue and other material debrided: Non-Viable, Slough, Skin: Epidermis, Slough Level: Skin/Epidermis Debridement Description: Selective/Open Wound Instrument: Curette Bleeding: Minimum Hemostasis Achieved: Pressure Response to Treatment: Procedure was tolerated well Level of Consciousness  (Post- Awake and Alert procedure): Post Debridement Measurements of Total Wound Length: (cm) 0.1 Width: (cm) 0.1 Depth: (cm) 0.1 Volume: (cm) 0.001 Character of Wound/Ulcer Post Debridement: Improved Severity of Tissue Post Debridement: Fat layer exposed Post Procedure Diagnosis Same as Pre-procedure Notes scribed for Dr. Mikey Bussing by Samuella Bruin, RN Electronic Signature(s) Signed: 04/19/2023 4:41:34 PM By: Samuella Bruin Signed: 04/20/2023 4:51:26 PM By: Geralyn Corwin DO Entered By: Samuella Bruin on 04/19/2023 13:21:29 [name], [name] ([id number]) [id number] Page 3 of 13 -------------------------------------------------------------------------------- HPI Details Patient Name: Date of Service: [name] N, [name]. 04/19/2023 12:45 PM Medical Record  Number: [id number] Patient Account Number: [id number] Date of Birth/Sex: Treating RN: [birth year] (62 y.o. F) Primary Care Provider: [name] Other Clinician: Referring Provider: Treating Provider/Extender: [name] in Treatment: 2 History of Present Illness HPI Description: Admission 03/05/2022 Ms. Anyiah Pohle is [name] 62 year old female with [name] past medical history of uncontrolled insulin-dependent type 2 diabetes, tobacco user and chronic diastolic heart failure that presents to the clinic for [name] 40-month history of wound to her left heel. She states she had [name] left ankle fusion in September 2022. She states that she always had [name] wound after the surgery and it never healed. She has home health and they have been doing compression wraps along with silver alginate to the wound bed. She currently denies signs of infection. 6/8; this is [name] 62 year old woman with type 2 diabetes. She developed [name] wound on her left Achilles heel just above the tip of the heel in the setting of recurrent ankle surgeries in late 2022. I have seen some of these results from either the cast or  the surgical boots that are put on after these operations. She thinks this may be the case. And [name] sense of pressure ulcer. In any case that she has been using Santyl Hydrofera Blue under compression. She is not wearing any footwear at home as she cannot find anything to accommodate the wrap 6/22; patient presents for follow-up. She has home health that comes out once [name] week to help with dressing changes. She has no issues or complaints today. We have been using Hydrofera Blue under compression therapy. 7/6; patient presents for follow-up. She states that home health did not come out for the past 2 weeks. It is unclear why. We have been using Hydrofera Blue and Santyl under compression therapy. 7/13; patient presents for follow-up. She did not take the oral antibiotics prescribed at last clinic visit. We have been using Hydrofera Blue with gentamicin/mupirocin ointment under compression therapy. She has no issues or complaints today. She denies signs of infection. 7/20; patient presents for follow-up. We have been using Hydrofera Blue with antibiotic ointment under 3 layer compression. She has home health that change the dressing once. They put collagen on the wound bed. She also reports falling yesterday and hitting her right foot. She has no pain to the area today. 7/27; patient presents for follow-up. We have been using Hydrofera Blue under 3 layer compression. She has home health that changes the dressing. She has no issues or complaints today. 8/3; patient presents for follow-up. We continues to use Hydrofera Blue under 3 layer compression. She has no issues or complaints today. She has been approved for Epicord at 100%. 8/11; patient presents for follow-up. We have been using Hydrofera Blue under 3 layer compression. She has been approved for Epicord and we have this today. She is in agreement with having this applied. 8/18; patient presents for follow-up. Epicord #1 was placed in standard  fashion last clinic visit. She has no issues or complaints today. 9/18; Unfortunately patient has missed her last clinic appointments due to falling and breaking her right ankle. We have been following her for her left ankle wound. She has also developed [name] large blister to the left heel over the past week that has ruptured and dried. She currently resides In [facility] Wound9/25; patient presents for follow-up. We have been using Hydrofera Blue and Santyl to the original left ankle wound and Xeroform to the dried blistered. We have been wrapping her with compression  therapy. She resides in [name] facility and they changed the wrap once this past week. She has no issues or complaints today. She denies signs of infection. 10/3; patient presents for follow-up. We have been using Xeroform to the heel wound and Epicort to the left ankle wound All under compression therapy. She has no issues or complaints today. 10/10; patient presents for follow-up. We have been using Epicort to the left ankle wound and Santyl and Hydrofera Blue to the heel wound. All under compression therapy. she has no issues or complaints today. 10/16; patient presents for follow-up. Epicort was placed in standard fashion to the superior wound and onto the heel and Santyl and Hydrofera Blue. She states that the wrap got wet during her shower and her facility was able to rewrap with Kerlix/Coban. 10/23; patient presents for follow-up. Epicord was placed to the wound beds last clinic visit. We continue Kerlix/Coban. She has no issues or complaints today. 10/31; Patient presents for follow-up. Epicord was placed to the wound beds last clinic visit. The original wound is almost healed. We have done this under Kerlix/Coban. She denies signs of infection. 11/10; patient presents for follow-up. Patient's last epi cord was placed in standard fashion at last clinic visit. The original wound has healed. She still has [name] heel wound. Grafix was  approved however too costly for the patient. We will rerun epi cord to see if she can have more applications. She had done very well with this. We have been using Hydrofera Blue to the heel under Kerlix/Coban. 11/21; patient presents for follow-up. We have been using Hydrofera Blue and Santyl to the heel under Kerlix/Coban. She switched to [name] new healthcare insurance and again The skin substitute is too costly for the patient. She denies signs of infection. 11/28; patient presents for follow-up. Her facility did not change the wrap last clinic visit due to lack of staffing. The wrap was taken off and Hydrofera Blue dressing has been in place for the past week. She has no issues or complaints today. 12/5; patient presents for follow-up. She states she has had more drainage over the past week. They did not change the wrap at her facility. She states the nurse will be back this week. She is also been doing [name] lot of physical therapy. She has been using the Prevalon boot while in bed. 12/12; patient presents for follow-up. We have been using Hydrofera Blue with antibiotic ointment under 4-layer compression. She is tolerated the wrap well. She states she is using her Prevalon boot while in the wheelchair. She has no issues or complaints today. There is been improvement in wound healing. [name], [name] ([id number]) [id number] Page 4 of 13 12/19; patient presents for follow-up. We have been using Hydrofera Blue and antibiotic ointment to the wound bed under 4-layer compression. Her facility change the wrap once. She states she has been using the Prevalon boot in bed but not in the wheelchair due to issues with transferring. Overall there is still improvement in wound healing. 1/16; patient presents for follow-up. She missed her last clinic appointment. We have been using Hydrofera Blue and antibiotic ointment under 4-layer compression. At the facility this has only been changed  twice over the past 3 weeks. They are not using 4-layer compression. She came in with Kerlix/Coban. 1/23; patient presents for follow-up. We have been using Santyl and Hydrofera Blue under 4-layer compression. Patient has no issues or complaints today. 2/1; patient presents for follow-up. Patient's been using Santyl and Hydrofera  Blue to the wound bed. She has developed more slough and now [name] mild odor. She states she is wearing the Prevalon boot at night. It is unclear if she is offloading this area during the day. 2/13; patient presents for follow-up. Patient's been using Dakin's wet-to-dry dressings. She received her Keystone antibiotic ointment in the mail. She has not used this yet. She reports using her Prevalon boot. She has no issues or complaints today. 2/20; patient presents for follow-up. She has been using Keystone antibiotic ointment with Hydrofera Blue. She has no issues or complaints today. Due to transportation she cannot do the total contact cast today. She is scheduled for next week to have this placed. 2/27; patient presents for follow-up. Plan is for the total contact cast today. We have been using Keystone and Hydrofera Blue to the wound bed. She forgot her Keystone antibiotic ointment. She has no issues or complaints today. 2/29; patient presents for follow-up. Patient presents for her obligatory cast change. She has no issues or complaints today. 3/5; patient presents for follow-up. She has had the cast in place for the past week and has tolerated this well. She has no issues or complaints today. We have been using PolyMem with Keystone antibiotic ointment. 12/15/2022: The wound is quite clean and measured smaller today. 3/19; we have been using Keystone antibiotic ointment with PolyMem silver under the total contact cast. Overall there is been improvement in wound healing. 3/26; patient presents for follow-up. She is been approved for epi fix. She would like to proceed with this  today. Previously we have been using Keystone antibiotic ointment with PolyMem silver under the total contact cast. She has no issues or complaints. 01/05/2023: The wound is clean and she is tolerating total contact casting without difficulty. She had her first application of EpiFix last week. 4/9; patient presents for follow-up. We have been applying EpiFix under the total contact cast for the past 2 weeks. She is tolerated this well. She has no issues or complaints today. 4/16; patient presents for follow-up. We have been using EpiFix under the total contact cast. She has done well with this. Wound is smaller. She does have slough buildup. 4/23; patient presents for follow-up. We have been using EpiFix under the total contact cast. Wound appears well-healing. She does have slough buildup. 4/30; patient presents for follow-up. We have been using EpiFix under the total contact cast. Wound is smaller. She again has slough buildup. 5/6; patient with [name] left heel ulcer type II diabetic. We applied EpiFix #7 under [name] total contact cast. Her wound is making good progress. 5/14; patient presents for follow-up. We have been using EpiFix under the total contact casting wound is smaller. 5/21; patient presents for follow-up. We have been using EpiFix under the total contact cast. The wound is smaller. She has no issues or complaints today. 5/28; patient presents for follow-up. We have been using EpiFix under the total contact cast. Wound is smaller. 6/11; patient presents for follow-up. We have been using EpiFix under the total contact cast. Wound is stable. 6/17; patient presents for follow-up. We have been using EpiFix under the total contact cast. Wound is smaller. 6/25; patient presents for follow-up. We have been using EpiFix under the total contact cast. Wound continues to improve in size and is much smaller. 7/1; patient presents for follow-up. We have been using EpiFix under the total contact cast. Wound  is almost healed. 7/9; patient presents for follow-up. We have been using EpiFix under the  total contact cast. Wound is still present but very small. 7/15; patient presents for follow-up. We have been using EpiFix under the total contact cast. Wound is almost healed. Electronic Signature(s) Signed: 04/20/2023 4:51:26 PM By: Geralyn Corwin DO Entered By: Geralyn Corwin on 04/19/2023 13:32:44 -------------------------------------------------------------------------------- Physical Exam Details Patient Name: Date of Service: [name] Harness MY [name]. 04/19/2023 12:45 PM Medical Record Number: [id number] Patient Account Number: [id number] Date of Birth/Sex: Treating RN: [birth year] (61 y.o. F) Primary Care Provider: [name] Other Clinician: Referring Provider: Treating Provider/Extender: [name] in Treatment: [address] Shea [name] ([id number]) [id number] Page 5 of 13 Constitutional respirations regular, non-labored and within target range for patient.. Cardiovascular 2+ dorsalis pedis/posterior tibialis pulses. Psychiatric pleasant and cooperative. Notes Small open wound to the heel with granulation tissue and slough accumulation. No signs of surrounding infection. Electronic Signature(s) Signed: 04/20/2023 4:51:26 PM By: Geralyn Corwin DO Entered By: Geralyn Corwin on 04/19/2023 13:33:28 -------------------------------------------------------------------------------- Physician Orders Details Patient Name: Date of Service: [name] Bushy [name]. 04/19/2023 12:45 PM Medical Record Number: [id number] Patient Account Number: [id number] Date of Birth/Sex: Treating RN: 08/28/1961 (61 y.o. Fredderick Phenix Primary Care Provider: [name] Other Clinician: Referring Provider: Treating Provider/Extender: [name] in Treatment: 15 Verbal / Phone Orders: No Diagnosis Coding Follow-up  Appointments ppointment in 1 week. - Dr. Mikey Bussing - room 8 Return [name] Other: - Leave dressing in place. Facility not to change dressing. Anesthetic (In clinic) Topical Lidocaine 5% applied to wound bed (In clinic) Topical Lidocaine 4% applied to wound bed Cellular or Tissue Based Products Cellular or Tissue Based Product Type: - Run IVR for EpiFix and Epicord=100% covered 05/07/22: Epicord ordered 12/08/2022: Run insurance for epicord and theraskin. 01/19/2023: Run for organogenesis- puraply AM daptic or Mepitel. (DO NOT REMOVE). - Cellular or Tissue Based Product applied to wound bed, secured with steri-strips, cover with [name] Epicord #1 05/15/25 Epicord #2 05/22/22 Epicord #3 06/29/2022 Epicord #4 07/07/2022 Epifix #5 07/14/22 Epicord #6 07/20/22 Epicord # 7 07/27/22 ***Run for Grafix***PENDING Epicord #8 08/04/2022 2024-IVR epifix approved 12/29/2022 #1 epifix applied to left heel. 01/05/2023 #2 epifix applied to left heel. 01/12/2023 #3 epifix applied to left heel 01/19/2023 #4 epifix applied to left heel. 01/26/23 Epifix #5 02/08/2023 #7 epifix 02/16/2023 #8 epifix 02/23/2023 #9 epifix 03/02/23 #10 epifix 03/09/2023 #11 epifix 18mm disk 03/16/2023 #12 epifix 18mm Disk 03/22/2023 #13 epifix 18mm DIsk 03/30/2023 #14 epifix 18mm Disk 04/05/2023 #15 epifix 18mm Disk 04/13/2023 #16 Epifix 2x3 04/19/2023 #17 Epifix 18 mm disk Bathing/ Shower/ Hygiene Kievit, Eisha [name] ([id number]) [phone].pdf Page 6 of 13 May shower with protection but do not get wound dressing(s) wet. Protect dressing(s) with water repellant cover (for example, large plastic bag) or [name] cast cover and may then take shower. Edema Control - Lymphedema / SCD / Other Elevate legs to the level of the heart or above for 30 minutes daily and/or when sitting for 3-4 times [name] day throughout the day. Avoid standing for long periods of time. Off-Loading Total Contact Cast to Left Lower Extremity - size 4 Additional Orders /  Instructions Follow Nutritious Diet - Monitor/Control Blood Sugar Wound Treatment Wound #2 - Calcaneus Wound Laterality: Left, Distal Cleanser: Soap and Water 1 x Per Week/30 Days Discharge Instructions: May shower and wash wound with dial antibacterial soap and water prior to dressing change. Cleanser: Wound Cleanser 1 x Per Week/30 Days Discharge Instructions: Cleanse the wound with wound cleanser prior to applying [name] clean dressing  using gauze sponges, not tissue or cotton balls. Prim Dressing: epifix 1 x Per Week/30 Days ary Discharge Instructions: placed by physician. leave in place. Secondary Dressing: ABD Pad, 8x10 1 x Per Week/30 Days Discharge Instructions: Apply over primary dressing as directed. Secondary Dressing: Woven Gauze Sponge, Non-Sterile 4x4 in (Home Health) 1 x Per Week/30 Days Discharge Instructions: Apply over primary dressing as directed. Secured With: [employer], 4.5x3.1 (in/yd) 1 x Per Week/30 Days Discharge Instructions: Secure with Kerlix as directed. Add-Ons: TCC size 4 1 x Per Week/30 Days Discharge Instructions: last layer applied by physician. Patient Medications llergies: ceftriaxone [name] Notifications Medication Indication Start End 04/19/2023 lidocaine DOSE topical 4 % cream - cream topical Electronic Signature(s) Signed: 04/20/2023 4:51:26 PM By: Geralyn Corwin DO Entered By: Geralyn Corwin on 04/19/2023 13:33:37 -------------------------------------------------------------------------------- Problem List Details Patient Name: Date of Service: [name] Bushy [name]. 04/19/2023 12:45 PM Medical Record Number: [id number] Patient Account Number: [id number] Date of Birth/Sex: Treating RN: October 04, 1961 (61 y.o. F) Primary Care Provider: [name] Other Clinician: Referring Provider: Treating Provider/Extender: [name]8 Active Problems ICD-10 Encounter Code Description Active Date  MDM Diagnosis L97.522 Non-pressure chronic ulcer of other part of left foot with fat layer exposed 03/05/2022 No Yes [name], [name] ([id number]) [phone].pdf Page 7 of 13 E11.621 Type 2 diabetes mellitus with foot ulcer 03/05/2022 No Yes E66.01 Morbid (severe) obesity due to excess calories 03/05/2022 No Yes Inactive Problems Resolved Problems Electronic Signature(s) Signed: 04/20/2023 4:51:26 PM By: Geralyn Corwin DO Entered By: Geralyn Corwin on 04/19/2023 13:32:01 -------------------------------------------------------------------------------- Progress Note Details Patient Name: Date of Service: [name] Bushy [name]. 04/19/2023 12:45 PM Medical Record Number: [id number] Patient Account Number: [id number] Date of Birth/Sex: Treating RN: 05-Jan-1961 (61 y.o. F) Primary Care Provider: [name] Other Clinician: Referring Provider: Treating Provider/Extender: [name]8 Subjective Chief Complaint Information obtained from Patient 03/05/2022; left foot wound History of Present Illness (HPI) Admission 03/05/2022 Ms. Anye Kuder is [name] 62 year old female with [name] past medical history of uncontrolled insulin-dependent type 2 diabetes, tobacco user and chronic diastolic heart failure that presents to the clinic for [name] 44-month history of wound to her left heel. She states she had [name] left ankle fusion in September 2022. She states that she always had [name] wound after the surgery and it never healed. She has home health and they have been doing compression wraps along with silver alginate to the wound bed. She currently denies signs of infection. 6/8; this is [name] 61 year old woman with type 2 diabetes. She developed [name] wound on her left Achilles heel just above the tip of the heel in the setting of recurrent ankle surgeries in late 2022. I have seen some of these results from either the cast or the surgical boots that are put on after  these operations. She thinks this may be the case. And [name] sense of pressure ulcer. In any case that she has been using Santyl Hydrofera Blue under compression. She is not wearing any footwear at home as she cannot find anything to accommodate the wrap 6/22; patient presents for follow-up. She has home health that comes out once [name] week to help with dressing changes. She has no issues or complaints today. We have been using Hydrofera Blue under compression therapy. 7/6; patient presents for follow-up. She states that home health did not come out for the past 2 weeks. It is unclear why. We have been using Hydrofera Blue  and Santyl under compression therapy. 7/13; patient presents for follow-up. She did not take the oral antibiotics prescribed at last clinic visit. We have been using Hydrofera Blue with gentamicin/mupirocin ointment under compression therapy. She has no issues or complaints today. She denies signs of infection. 7/20; patient presents for follow-up. We have been using Hydrofera Blue with antibiotic ointment under 3 layer compression. She has home health that change the dressing once. They put collagen on the wound bed. She also reports falling yesterday and hitting her right foot. She has no pain to the area today. 7/27; patient presents for follow-up. We have been using Hydrofera Blue under 3 layer compression. She has home health that changes the dressing. She has no issues or complaints today. 8/3; patient presents for follow-up. We continues to use Hydrofera Blue under 3 layer compression. She has no issues or complaints today. She has been approved for Epicord at 100%. 8/11; patient presents for follow-up. We have been using Hydrofera Blue under 3 layer compression. She has been approved for Epicord and we have this today. She is in agreement with having this applied. 8/18; patient presents for follow-up. Epicord #1 was placed in standard fashion last clinic visit. She has no issues  or complaints today. 9/18; Unfortunately patient has missed her last clinic appointments due to falling and breaking her right ankle. We have been following her for her left ankle wound. She has also developed [name] large blister to the left heel over the past week that has ruptured and dried. She currently resides In [facility]. Wound9/25; patient presents for follow-up. We have been using Hydrofera Blue and Santyl to the original left ankle wound and Xeroform to the dried blistered. We have been wrapping her with compression therapy. She resides in [name] facility and they changed the wrap once this past week. She has no issues or [name], [name] ([id number]) [phone].pdf Page 8 of 13 complaints today. She denies signs of infection. 10/3; patient presents for follow-up. We have been using Xeroform to the heel wound and Epicort to the left ankle wound All under compression therapy. She has no issues or complaints today. 10/10; patient presents for follow-up. We have been using Epicort to the left ankle wound and Santyl and Hydrofera Blue to the heel wound. All under compression therapy. she has no issues or complaints today. 10/16; patient presents for follow-up. Epicort was placed in standard fashion to the superior wound and onto the heel and Santyl and Hydrofera Blue. She states that the wrap got wet during her shower and her facility was able to rewrap with Kerlix/Coban. 10/23; patient presents for follow-up. Epicord was placed to the wound beds last clinic visit. We continue Kerlix/Coban. She has no issues or complaints today. 10/31; Patient presents for follow-up. Epicord was placed to the wound beds last clinic visit. The original wound is almost healed. We have done this under Kerlix/Coban. She denies signs of infection. 11/10; patient presents for follow-up. Patient's last epi cord was placed in standard fashion at last clinic visit. The original wound has healed. She  still has [name] heel wound. Grafix was approved however too costly for the patient. We will rerun epi cord to see if she can have more applications. She had done very well with this. We have been using Hydrofera Blue to the heel under Kerlix/Coban. 11/21; patient presents for follow-up. We have been using Hydrofera Blue and Santyl to the heel under Kerlix/Coban. She switched to [name] new healthcare insurance and again  The skin substitute is too costly for the patient. She denies signs of infection. 11/28; patient presents for follow-up. Her facility did not change the wrap last clinic visit due to lack of staffing. The wrap was taken off and Hydrofera Blue dressing has been in place for the past week. She has no issues or complaints today. 12/5; patient presents for follow-up. She states she has had more drainage over the past week. They did not change the wrap at her facility. She states the nurse will be back this week. She is also been doing [name] lot of physical therapy. She has been using the Prevalon boot while in bed. 12/12; patient presents for follow-up. We have been using Hydrofera Blue with antibiotic ointment under 4-layer compression. She is tolerated the wrap well. She states she is using her Prevalon boot while in the wheelchair. She has no issues or complaints today. There is been improvement in wound healing. 12/19; patient presents for follow-up. We have been using Hydrofera Blue and antibiotic ointment to the wound bed under 4-layer compression. Her facility change the wrap once. She states she has been using the Prevalon boot in bed but not in the wheelchair due to issues with transferring. Overall there is still improvement in wound healing. 1/16; patient presents for follow-up. She missed her last clinic appointment. We have been using Hydrofera Blue and antibiotic ointment under 4-layer compression. At the facility this has only been changed twice over the past 3 weeks. They are not using  4-layer compression. She came in with Kerlix/Coban. 1/23; patient presents for follow-up. We have been using Santyl and Hydrofera Blue under 4-layer compression. Patient has no issues or complaints today. 2/1; patient presents for follow-up. Patient's been using Santyl and Hydrofera Blue to the wound bed. She has developed more slough and now [name] mild odor. She states she is wearing the Prevalon boot at night. It is unclear if she is offloading this area during the day. 2/13; patient presents for follow-up. Patient's been using Dakin's wet-to-dry dressings. She received her Keystone antibiotic ointment in the mail. She has not used this yet. She reports using her Prevalon boot. She has no issues or complaints today. 2/20; patient presents for follow-up. She has been using Keystone antibiotic ointment with Hydrofera Blue. She has no issues or complaints today. Due to transportation she cannot do the total contact cast today. She is scheduled for next week to have this placed. 2/27; patient presents for follow-up. Plan is for the total contact cast today. We have been using Keystone and Hydrofera Blue to the wound bed. She forgot her Keystone antibiotic ointment. She has no issues or complaints today. 2/29; patient presents for follow-up. Patient presents for her obligatory cast change. She has no issues or complaints today. 3/5; patient presents for follow-up. She has had the cast in place for the past week and has tolerated this well. She has no issues or complaints today. We have been using PolyMem with Keystone antibiotic ointment. 12/15/2022: The wound is quite clean and measured smaller today. 3/19; we have been using Keystone antibiotic ointment with PolyMem silver under the total contact cast. Overall there is been improvement in wound healing. 3/26; patient presents for follow-up. She is been approved for epi fix. She would like to proceed with this today. Previously we have been using  Keystone antibiotic ointment with PolyMem silver under the total contact cast. She has no issues or complaints. 01/05/2023: The wound is clean and she is tolerating total  contact casting without difficulty. She had her first application of EpiFix last week. 4/9; patient presents for follow-up. We have been applying EpiFix under the total contact cast for the past 2 weeks. She is tolerated this well. She has no issues or complaints today. 4/16; patient presents for follow-up. We have been using EpiFix under the total contact cast. She has done well with this. Wound is smaller. She does have slough buildup. 4/23; patient presents for follow-up. We have been using EpiFix under the total contact cast. Wound appears well-healing. She does have slough buildup. 4/30; patient presents for follow-up. We have been using EpiFix under the total contact cast. Wound is smaller. She again has slough buildup. 5/6; patient with [name] left heel ulcer type II diabetic. We applied EpiFix #7 under [name] total contact cast. Her wound is making good progress. 5/14; patient presents for follow-up. We have been using EpiFix under the total contact casting wound is smaller. 5/21; patient presents for follow-up. We have been using EpiFix under the total contact cast. The wound is smaller. She has no issues or complaints today. 5/28; patient presents for follow-up. We have been using EpiFix under the total contact cast. Wound is smaller. 6/11; patient presents for follow-up. We have been using EpiFix under the total contact cast. Wound is stable. 6/17; patient presents for follow-up. We have been using EpiFix under the total contact cast. Wound is smaller. 6/25; patient presents for follow-up. We have been using EpiFix under the total contact cast. Wound continues to improve in size and is much smaller. 7/1; patient presents for follow-up. We have been using EpiFix under the total contact cast. Wound is almost healed. [name], [name]  ([id number]) [id number] Page 9 of 13 7/9; patient presents for follow-up. We have been using EpiFix under the total contact cast. Wound is still present but very small. 7/15; patient presents for follow-up. We have been using EpiFix under the total contact cast. Wound is almost healed. Patient History Information obtained from Patient, Chart. Family History Cancer - Father,Mother, Diabetes - Mother,Maternal Grandparents,Paternal Grandparents, Heart Disease - Siblings,Maternal Grandparents, Hypertension - Siblings, Tuberculosis - Maternal Grandparents,Paternal Grandparents. Social History Current every day smoker, Marital Status - Single, Alcohol Use - Never, Drug Use - No History, Caffeine Use - Daily. 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 Vitals Time Taken: 12:59 PM, Height: 66 in, Weight: 339 lbs, BMI: 54.7, Temperature: 98.3 F, Pulse: 93 bpm, Respiratory Rate: 18 breaths/min, Blood Pressure: 135/72 mmHg, Capillary Blood Glucose: 109 mg/dl. Cardiovascular 2+ dorsalis pedis/posterior tibialis pulses. Psychiatric pleasant and cooperative. General Notes: Small open wound to the heel with granulation tissue and slough accumulation. No signs of surrounding infection. Integumentary (Hair, Skin) Wound #2 status is Open. Original cause  of wound was Blister. The date acquired was: 06/15/2022. The wound has been in treatment 42 weeks. The wound is located on the Left,Distal Calcaneus. The wound measures 0.1cm length x 0.1cm width x 0.1cm depth; 0.008cm^2 area and 0.001cm^3 volume. There is [name] none present amount of drainage noted. The  wound margin is distinct with the outline attached to the wound base. There is no granulation within the wound bed. There is no necrotic tissue within the wound bed. The periwound skin appearance had no abnormalities noted for texture. The periwound skin appearance had no abnormalities noted for color. The periwound skin appearance did not exhibit: Dry/Scaly, Maceration. Periwound temperature was noted as No Abnormality. Assessment Active Problems ICD-10 Non-pressure chronic ulcer of other part of left foot with fat layer exposed Type 2 diabetes mellitus with foot ulcer Morbid (severe) obesity due to excess calories Patient's wound appears well-healing. It is almost healed however she still has [name] small area open. I debrided nonviable tissue and EpiFix was placed in standard fashion. We continued the total contact cast. Follow-up in 1 week. [name], [name] ([id number]) [id number] Page 10 of 13 Procedures Wound #2 Pre-procedure diagnosis of Wound #2 is [name] Diabetic Wound/Ulcer of the Lower Extremity located on the Left,Distal Calcaneus .Severity of Tissue Pre Debridement is: Fat layer exposed. There was [name] Selective/Open Wound Skin/Epidermis Debridement with [name] total area of 0.01 sq cm performed by Geralyn Corwin, DO. With the following instrument(s): Curette to remove Non-Viable tissue/material. Material removed includes Slough and Skin: Epidermis and after achieving pain control using Lidocaine 5% topical ointment. No specimens were taken. [name] time out was conducted at 13:20, prior to the start of the procedure. [name] Minimum amount of bleeding was controlled with Pressure. The  procedure was tolerated well. Post Debridement Measurements: 0.1cm length x 0.1cm width x 0.1cm depth; 0.001cm^3 volume. Character of Wound/Ulcer Post Debridement is improved. Severity of Tissue Post Debridement is: Fat layer exposed. Post procedure Diagnosis Wound #2: Same as Pre-Procedure General Notes: scribed for Dr. Mikey Bussing by Samuella Bruin, RN. Pre-procedure diagnosis of Wound #2 is [name] Diabetic Wound/Ulcer of the Lower Extremity located on the Left,Distal Calcaneus. [name] skin graft procedure using [name] bioengineered skin substitute/cellular or tissue based product was performed by Geralyn Corwin, DO with the following instrument(s): Forceps and Scissors. Epifix was applied and secured with Steri-Strips. 1 sq cm of product was utilized and 0 sq cm was wasted. Post Application, adaptic was applied. [name] Time Out was conducted at 13:21, prior to the start of the procedure. The procedure was tolerated well with [name] pain level of 0 throughout and [name] pain level of 0 following the procedure. Post procedure Diagnosis Wound #2: Same as Pre-Procedure General Notes: scribed for Dr. Mikey Bussing by Samuella Bruin, RN. Pre-procedure diagnosis of Wound #2 is [name] Diabetic Wound/Ulcer of the Lower Extremity located on the Left,Distal Calcaneus . There was [name] T Research scientist (life sciences) otal Procedure by Geralyn Corwin, DO. Post procedure Diagnosis Wound #2: Same as Pre-Procedure Notes: scribed for Dr. Mikey Bussing by Samuella Bruin, RN. Plan Follow-up Appointments: Return Appointment in 1 week. - Dr. Mikey Bussing - room 8 Other: - Leave dressing in place. Facility not to change dressing. Anesthetic: (In clinic) Topical Lidocaine 5% applied to wound bed (In clinic) Topical Lidocaine 4% applied to wound bed Cellular or Tissue Based Products: Cellular or Tissue Based Product Type: - Run IVR for EpiFix and Epicord=100% covered 05/07/22: Epicord ordered 12/08/2022: Run insurance for epicord and theraskin. 01/19/2023: Run for organogenesis-  puraply AM Cellular or Tissue Based Product applied to wound bed, secured with steri-strips, cover with Adaptic or Mepitel. (DO NOT REMOVE). - Epicord #1 05/15/25 Epicord #2 05/22/22 Epicord #3 06/29/2022 Epicord #4 07/07/2022 Epifix #5 07/14/22 Epicord #6 07/20/22 Epicord # 7 07/27/22 ***Run for Grafix***PENDING Epicord #8 08/04/2022 2024-IVR epifix approved  12/29/2022 #1 epifix applied to left heel. 01/05/2023 #2 epifix applied to left heel. 01/12/2023 #3 epifix applied to left heel 01/19/2023 #4 epifix applied to left heel. 01/26/23 Epifix #5 02/08/2023 #7 epifix 02/16/2023 #8 epifix 02/23/2023 #9 epifix 03/02/23 #10 epifix 03/09/2023 #11 epifix 18mm disk 03/16/2023 #12 epifix 18mm Disk 03/22/2023 #13 epifix 18mm DIsk 03/30/2023 #14 epifix 18mm Disk 04/05/2023 #15 epifix 18mm Disk 04/13/2023 #16 Epifix 2x3 04/19/2023 #17 Epifix 18 mm disk Bathing/ Shower/ Hygiene: May shower with protection but do not get wound dressing(s) wet. Protect dressing(s) with water repellant cover (for example, large plastic bag) or [name] cast cover and may then take shower. Edema Control - Lymphedema / SCD / Other: Elevate legs to the level of the heart or above for 30 minutes daily and/or when sitting for 3-4 times [name] day throughout the day. Avoid standing for long periods of time. Off-Loading: T Contact Cast to Left Lower Extremity - size 4 otal Additional Orders / Instructions: Follow Nutritious Diet - Monitor/Control Blood Sugar The following medication(s) was prescribed: lidocaine topical 4 % cream cream topical was prescribed at facility WOUND #2: - Calcaneus Wound Laterality: Left, Distal Cleanser: Soap and Water 1 x Per Week/30 Days Discharge Instructions: May shower and wash wound with dial antibacterial soap and water prior to dressing change. Cleanser: Wound Cleanser 1 x Per Week/30 Days Discharge Instructions: Cleanse the wound with wound cleanser prior to applying [name] clean dressing using gauze sponges, not tissue or cotton  balls. Prim Dressing: epifix 1 x Per Week/30 Days ary Discharge Instructions: placed by physician. leave in place. Secondary Dressing: ABD Pad, 8x10 1 x Per Week/30 Days Discharge Instructions: Apply over primary dressing as directed. Secondary Dressing: Woven Gauze Sponge, Non-Sterile 4x4 in (Home Health) 1 x Per Week/30 Days Discharge Instructions: Apply over primary dressing as directed. Secured With: [employer], 4.5x3.1 (in/yd) 1 x Per Week/30 Days Discharge Instructions: Secure with Kerlix as directed. Add-Ons: TCC size 4 1 x Per Week/30 Days Discharge Instructions: last layer applied by physician. 1. In office sharp debridement 2. EpiFix placed in standard fashion 3. T contact cast placed in standard fashion otal 4. Follow-up in 1 week Electronic Signature(s) Signed: 04/20/2023 4:51:26 PM By: Geralyn Corwin DO [name], [name] ([id number]) [id number] Page 11 of 13 Entered By: Geralyn Corwin on 04/19/2023 13:34:52 -------------------------------------------------------------------------------- HxROS Details Patient Name: Date of Service: [name] Bushy [name]. 04/19/2023 12:45 PM Medical Record Number: [id number] Patient Account Number: [id number] Date of Birth/Sex: Treating RN: [birth year] (62 y.o. F) Primary Care Provider: [name] Other Clinician: Referring Provider: Treating Provider/Extender: [name]8 Information Obtained From Patient Chart Hematologic/Lymphatic Medical History: Positive for: Anemia Respiratory Medical History: Positive for: Chronic Obstructive Pulmonary Disease (COPD) Cardiovascular Medical History: Positive for: Congestive Heart Failure - Weighs self daily; Hypertension; Myocardial Infarction; Peripheral Venous Disease Gastrointestinal Medical History: Past Medical History Notes: GERD, Peptic Ulcer Disease Endocrine Medical History: Positive for: Type  II Diabetes Time with diabetes: 16 years Treated with: Insulin, Oral agents Blood sugar tested every day: Yes Tested : Twice daily Musculoskeletal Medical History: Past Medical History Notes: Broke left leg 3 years ago, 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, foot fused Neurologic Medical History: Positive for: Neuropathy Psychiatric Medical History: Past Medical History Notes: Depression Immunizations Pneumococcal Vaccine: Received Pneumococcal Vaccination: No Implantable Devices None Family and Social History [name], [name] ([id number]) [id number] Page 12 of 13 Cancer: Yes - Father,Mother; Diabetes: Yes - Mother,Maternal Grandparents,Paternal Grandparents; Heart Disease: Yes -  Siblings,Maternal Grandparents; Hypertension: Yes - Siblings; Tuberculosis: Yes - Maternal Grandparents,Paternal Grandparents; Current every day smoker; Marital Status - Single; Alcohol Use: Never; Drug Use: No History; Caffeine Use: Daily; Financial Concerns: No; Food, Clothing or Shelter Needs: No; Support System Lacking: No; Transportation Concerns: No Electronic Signature(s) Signed: 04/20/2023 4:51:26 PM By: Geralyn Corwin DO Entered By: Geralyn Corwin on 04/19/2023 13:32:52 -------------------------------------------------------------------------------- Total Contact Cast Details Patient Name: Date of Service: [name] Bushy [name]. 04/19/2023 12:45 PM Medical Record Number: [id number] Patient Account Number: [id number] Date of Birth/Sex: Treating RN: 1961-08-09 (61 y.o. Fredderick Phenix Primary Care Provider: [name] Other Clinician: Referring Provider: Treating Provider/Extender: [name] in Treatment: 86 T Contact Cast Applied for Wound Assessment: otal Wound #2 Left,Distal Calcaneus Performed By: Physician Geralyn Corwin, DO Post Procedure Diagnosis Same as Pre-procedure Notes scribed for Dr. Mikey Bussing by Samuella Bruin,  RN Electronic Signature(s) Signed: 04/19/2023 4:41:34 PM By: Samuella Bruin Signed: 04/20/2023 4:51:26 PM By: Geralyn Corwin DO Entered By: Samuella Bruin on 04/19/2023 13:21:39 -------------------------------------------------------------------------------- SuperBill Details Patient Name: Date of Service: [name] Bushy [name]. 04/19/2023 Medical Record Number: [id number] Patient Account Number: [id number] Date of Birth/Sex: Treating RN: 19-Aug-1961 (61 y.o. F) Primary Care Provider: [name] Other Clinician: Referring Provider: Treating Provider/Extender: [name]8 Diagnosis Coding ICD-10 Codes Code Description [phone] Non-pressure chronic ulcer of other part of left foot with fat layer exposed E11.621 Type 2 diabetes mellitus with foot ulcer E66.01 Morbid (severe) obesity due to excess calories Facility Procedures : [name], CPT4 Code: [id number] 1 Halena [name] ([id number]) [id number] Q I Description: 5275 - SKIN SUB GRAFT FACE/NK/HF/G ICD-10 Diagnosis Description L97.522 Non-pressure chronic ulcer of other part of left foot with fat layer exposed E11.621 Type 2 diabetes mellitus with foot ulcer E66.01 Morbid (severe) obesity due to  excess calories [phone] Epifix 18mm disk qty 3 CD-10 Diagnosis Description L97.522 Non-pressure chronic ulcer of other part of left foot with fat layer exposed E11.621 Type 2 diabetes mellitus with foot ulcer E66.01 Morbid (severe)  obesity due to excess calories Modifier: _[id number] Quantity: 1 .pdf Page 13 of 13 Physician Procedures : CPT4 Code Description Modifier [id number] - WC PHYS SKIN SUB GRAFT FACE/NK/HF/G ICD-10 Diagnosis Description L97.522 Non-pressure chronic ulcer of other part of left foot with fat layer exposed E11.621 Type 2 diabetes mellitus with foot ulcer E66.01  Morbid (severe) obesity due to excess calories Quantity: 1 Electronic Signature(s) Signed:  04/29/2023 11:04:42 AM By: Pearletha Alfred Signed: 04/29/2023 3:41:47 PM By: Geralyn Corwin DO Previous Signature: 04/20/2023 4:51:26 PM Version By: Geralyn Corwin DO Entered By: Pearletha Alfred on 04/29/2023 11:04:41

## 2023-04-20 NOTE — Progress Notes (Signed)
Diprima, Antonae C ([id number]) [id number] Page 1 of 8 Visit Report for 04/19/2023 Arrival Information Details Patient Name: Date of Service: [name] 04/19/2023 12:45 PM Medical Record Number: [id number] Patient Account Number: [id number] Date of Birth/Sex: Treating RN: [birth year] (62 y.o. [name] Primary Care Abigayle Wilinski: [name] Other Clinician: Referring Arilynn Blakeney: Treating Tobi Groesbeck/Extender: [name] in Treatment: 37 Visit Information History Since Last Visit Added or deleted any medications: No Patient Arrived: Wheel Chair Any new allergies or adverse reactions: No Arrival Time: 12:58 Had a fall or experienced change in No Accompanied By: self activities of daily living that may affect Transfer Assistance: Manual risk of falls: Patient Identification Verified: Yes Signs or symptoms of abuse/neglect since last visito No Secondary Verification Process Completed: Yes Hospitalized since last visit: No Patient Requires Transmission-Based Precautions: No Implantable device outside of the clinic excluding No Patient Has Alerts: No cellular tissue based products placed in the center since last visit: Has Dressing in Place as Prescribed: Yes Has Footwear/Offloading in Place as Prescribed: Yes Left: T Contact Cast otal Pain Present Now: No Electronic Signature(s) Signed: 04/19/2023 4:41:34 PM By: Samuella Bruin Entered By: Samuella Bruin on 04/19/2023 12:59:18 -------------------------------------------------------------------------------- Encounter Discharge Information Details Patient Name: Date of Service: [name] Bushy C. 04/19/2023 12:45 PM Medical Record Number: [id number] Patient Account Number: [id number] Date of Birth/Sex: Treating RN: [birth year] (61 y.o. [name] Primary Care Brandye Inthavong: [name] Other Clinician: Referring Leisel Pinette: Treating  Kinzee Happel/Extender: [name] in Treatment: 90 Encounter Discharge Information Items Post Procedure Vitals Discharge Condition: Stable Temperature (F): 98.3 Ambulatory Status: Wheelchair Pulse (bpm): 93 Discharge Destination: Home Respiratory Rate (breaths/min): 18 Transportation: Private Auto Blood Pressure (mmHg): 135/72 Accompanied By: self Schedule Follow-up Appointment: Yes Clinical Summary of Care: Patient Declined Electronic Signature(s) Signed: 04/19/2023 4:41:34 PM By: Samuella Bruin Entered By: Samuella Bruin on 04/19/2023 13:27:24 Magri, Joletta C ([id number]) [id number] Page 2 of 8 -------------------------------------------------------------------------------- Lower Extremity Assessment Details Patient Name: Date of Service: [name] Bushy C. 04/19/2023 12:45 PM Medical Record Number: [id number] Patient Account Number: [id number] Date of Birth/Sex: Treating RN: [birth year] (62 y.o. [name] Primary Care Sarai January: [name] Other Clinician: Referring Kalyssa Anker: Treating Venera Privott/Extender: [name]8 Edema Assessment Assessed: [name]: No] [name]: No] Edema: [Left: Ye] [Right: s] Calf Left: Right: Point of Measurement: 28 cm From Medial Instep 46.5 cm Ankle Left: Right: Point of Measurement: 3 cm From Medial Instep 31 cm Electronic Signature(s) Signed: 04/19/2023 4:41:34 PM By: Samuella Bruin Entered By: Samuella Bruin on 04/19/2023 13:17:46 -------------------------------------------------------------------------------- Multi Wound Chart Details Patient Name: Date of Service: [name] Bushy C. 04/19/2023 12:45 PM Medical Record Number: [id number] Patient Account Number: [id number] Date of Birth/Sex: Treating RN: 08-Oct-1960 (61 y.o. F) Primary Care [name]: [name] Other Clinician: Referring Darion Juhasz: Treating  Archer Moist/Extender: [name]8 Vital Signs Height(in): 66 Capillary Blood Glucose(mg/dl): 623 Weight(lbs): 762 Pulse(bpm): 93 Body Mass Index(BMI): 54.7 Blood Pressure(mmHg): 135/72 Temperature(F): 98.3 Respiratory Rate(breaths/min): 18 [2:Photos:] [N/A:N/A] Left, Distal Calcaneus N/A N/A Wound Location: Blister N/A N/A Wounding Event: Diabetic Wound/Ulcer of the Lower N/A N/A Primary Etiology: Extremity Anemia, Chronic Obstructive N/A N/A Comorbid History: Pulmonary Disease (COPD), Congestive Heart Failure, Hypertension, Myocardial Infarction, Peripheral Venous Disease, Type II Wissner, Meghanne C ([id number]) [id number] Page 3 of 8 Diabetes, Neuropathy 06/15/2022 N/A N/A Date Acquired: 23 N/A N/A Weeks of Treatment: Open N/A N/A Wound Status: No N/A N/A Wound  Recurrence: 0.1x0.1x0.1 N/A N/A Measurements L x W x D (cm) 0.008 N/A N/A A (cm) : rea 0.001 N/A N/A Volume (cm) : 99.90% N/A N/A % Reduction in A rea: 99.90% N/A N/A % Reduction in Volume: Grade 2 N/A N/A Classification: None Present N/A N/A Exudate A mount: Distinct, outline attached N/A N/A Wound Margin: None Present (0%) N/A N/A Granulation A mount: None Present (0%) N/A N/A Necrotic A mount: Fascia: No N/A N/A Exposed Structures: Fat Layer (Subcutaneous Tissue): No Tendon: No Muscle: No Joint: No Bone: No Large (67-100%) N/A N/A Epithelialization: Debridement - Selective/Open Wound N/A N/A Debridement: Pre-procedure Verification/Time Out 13:20 N/A N/A Taken: Lidocaine 5% topical ointment N/A N/A Pain Control: Slough N/A N/A Tissue Debrided: Skin/Epidermis N/A N/A Level: 0.01 N/A N/A Debridement A (sq cm): rea Curette N/A N/A Instrument: Minimum N/A N/A Bleeding: Pressure N/A N/A Hemostasis A chieved: Procedure was tolerated well N/A N/A Debridement Treatment Response: 0.1x0.1x0.1 N/A N/A Post  Debridement Measurements L x W x D (cm) 0.001 N/A N/A Post Debridement Volume: (cm) No Abnormalities Noted N/A N/A Periwound Skin Texture: Maceration: No N/A N/A Periwound Skin Moisture: Dry/Scaly: No No Abnormalities Noted N/A N/A Periwound Skin Color: No Abnormality N/A N/A Temperature: Cellular or Tissue Based Product N/A N/A Procedures Performed: Debridement T Contact Cast otal Treatment Notes Wound #2 (Calcaneus) Wound Laterality: Left, Distal Cleanser Soap and Water Discharge Instruction: May shower and wash wound with dial antibacterial soap and water prior to dressing change. Wound Cleanser Discharge Instruction: Cleanse the wound with wound cleanser prior to applying a clean dressing using gauze sponges, not tissue or cotton balls. Peri-Wound Care Topical Primary Dressing epifix Discharge Instruction: placed by physician. leave in place. Secondary Dressing ABD Pad, 8x10 Discharge Instruction: Apply over primary dressing as directed. Woven Gauze Sponge, Non-Sterile 4x4 in Discharge Instruction: Apply over primary dressing as directed. Secured With [employer], 4.5x3.1 (in/yd) Discharge Instruction: Secure with Kerlix as directed. Compression Wrap Compression Stockings Add-Ons TCC size 4 Discharge Instruction: last layer applied by physician. Spellman, Nerea C ([id number]) [id number] Page 4 of 8 Electronic Signature(s) Signed: 04/20/2023 4:51:26 PM By: Geralyn Corwin DO Entered By: Geralyn Corwin on 04/19/2023 13:32:06 -------------------------------------------------------------------------------- Multi-Disciplinary Care Plan Details Patient Name: Date of Service: [name] Bushy C. 04/19/2023 12:45 PM Medical Record Number: [id number] Patient Account Number: [id number] Date of Birth/Sex: Treating RN: 10-09-1960 (61 y.o. [name] Primary Care Suede Greenawalt: [name] Other Clinician: Referring  Savahanna Almendariz: Treating Prajwal Fellner/Extender: [name] in Treatment: 76 Active Inactive Abuse / Safety / Falls / Self Care Management Nursing Diagnoses: History of Falls Goals: Patient will not experience any injury related to falls Date Initiated: 03/05/2022 Target Resolution Date: 04/30/2023 Goal Status: Active Interventions: Assess fall risk on admission and as needed Assess: immobility, friction, shearing, incontinence upon admission and as needed Assess impairment of mobility on admission and as needed per policy Notes: 04/30/22: Fall prevention ongoing, recent fall. Wound/Skin Impairment Nursing Diagnoses: Impaired tissue integrity Goals: Patient/caregiver will verbalize understanding of skin care regimen Date Initiated: 03/05/2022 Target Resolution Date: 04/30/2023 Goal Status: Active Ulcer/skin breakdown will have a volume reduction of 30% by week 4 Date Initiated: 03/05/2022 Date Inactivated: 04/30/2022 Target Resolution Date: 05/02/2022 Goal Status: Met Ulcer/skin breakdown will have a volume reduction of 50% by week 8 Date Initiated: 04/30/2022 Date Inactivated: 07/07/2022 Target Resolution Date: 07/04/2022 Unmet Reason: patient sent three Goal Status: Unmet weeks inpatient and rehab before returning to wound center. Interventions: Assess patient/caregiver ability to obtain  necessary supplies Assess patient/caregiver ability to perform ulcer/skin care regimen upon admission and as needed Assess ulceration(s) every visit Provide education on smoking Provide education on ulcer and skin care Treatment Activities: Topical wound management initiated : 03/05/2022 Notes: 04/30/22: Wound care regimen continues. Eagleton, Leotta C ([id number]) [id number] Page 5 of 8 Electronic Signature(s) Signed: 04/19/2023 4:41:34 PM By: Samuella Bruin Entered By: Samuella Bruin on 04/19/2023  13:25:19 -------------------------------------------------------------------------------- Pain Assessment Details Patient Name: Date of Service: [name] Bushy C. 04/19/2023 12:45 PM Medical Record Number: [id number] Patient Account Number: [id number] Date of Birth/Sex: Treating RN: [birth year] (61 y.o. [name] Primary Care Tramaine Snell: [name] Other Clinician: Referring Rheana Casebolt: Treating Shaneil Yazdi/Extender: [name]8 Active Problems Location of Pain Severity and Description of Pain Patient Has Paino No Site Locations Rate the pain. Current Pain Level: 0 Pain Management and Medication Current Pain Management: Electronic Signature(s) Signed: 04/19/2023 4:41:34 PM By: Samuella Bruin Entered By: Samuella Bruin on 04/19/2023 12:59:45 -------------------------------------------------------------------------------- Patient/Caregiver Education Details Patient Name: Date of Service: [name] 7/15/2024andnbsp12:45 PM Medical Record Number: [id number] Patient Account Number: [id number] Date of Birth/Gender: Treating RN: [birth year] (62 y.o. [name] Primary Care Physician: [name] Other Clinician: Referring Physician: Treating Physician/Extender: [name] in Treatment: 46 Education Assessment Education Provided To: Patient SHIMMEL, Kailene C ([id number]) [id number] Page 6 of 8 Education Topics Provided Wound/Skin Impairment: Methods: Explain/Verbal Responses: Reinforcements needed, State content correctly Electronic Signature(s) Signed: 04/19/2023 4:41:34 PM By: Samuella Bruin Entered By: Samuella Bruin on 04/19/2023 13:25:34 -------------------------------------------------------------------------------- Wound Assessment Details Patient Name: Date of Service: [name] Bushy C. 04/19/2023 12:45 PM Medical Record  Number: [id number] Patient Account Number: [id number] Date of Birth/Sex: Treating RN: 1960/11/12 (61 y.o. F) Primary Care Ha Shannahan: [name] Other Clinician: Referring Oliveah Zwack: Treating Ajah Vanhoose/Extender: [name]8 Wound Status Wound Number: 2 Primary Diabetic Wound/Ulcer of the Lower Extremity Etiology: Wound Location: Left, Distal Calcaneus Wound Open Wounding Event: Blister Status: Date Acquired: 06/15/2022 Comorbid Anemia, Chronic Obstructive Pulmonary Disease (COPD), Weeks Of Treatment: 42 History: Congestive Heart Failure, Hypertension, Myocardial Infarction, Clustered Wound: No Peripheral Venous Disease, Type II Diabetes, Neuropathy Photos Wound Measurements Length: (cm) 0.1 Width: (cm) 0.1 Depth: (cm) 0.1 Area: (cm) 0.008 Volume: (cm) 0.001 % Reduction in Area: 99.9% % Reduction in Volume: 99.9% Epithelialization: Large (67-100%) Wound Description Classification: Grade 2 Wound Margin: Distinct, outline attached Exudate Amount: None Present Foul Odor After Cleansing: No Slough/Fibrino No Wound Bed Granulation Amount: None Present (0%) Exposed Structure Necrotic Amount: None Present (0%) Fascia Exposed: No Fat Layer (Subcutaneous Tissue) Exposed: No Tendon Exposed: No Muscle Exposed: No Joint Exposed: No Bone Exposed: No Periwound Skin Texture Texture Color Newhard, Satcha C ([id number]) [id number] Page 7 of 8 No Abnormalities Noted: Yes No Abnormalities Noted: Yes Moisture Temperature / Pain No Abnormalities Noted: No Temperature: No Abnormality Dry / Scaly: No Maceration: No Treatment Notes Wound #2 (Calcaneus) Wound Laterality: Left, Distal Cleanser Soap and Water Discharge Instruction: May shower and wash wound with dial antibacterial soap and water prior to dressing change. Wound Cleanser Discharge Instruction: Cleanse the wound with wound cleanser prior to applying a  clean dressing using gauze sponges, not tissue or cotton balls. Peri-Wound Care Topical Primary Dressing epifix Discharge Instruction: placed by physician. leave in place. Secondary Dressing ABD Pad, 8x10 Discharge Instruction: Apply over primary dressing as directed. Woven Gauze Sponge, Non-Sterile 4x4 in Discharge Instruction: Apply over primary dressing as directed.  Secured With [employer], 4.5x3.1 (in/yd) Discharge Instruction: Secure with Kerlix as directed. Compression Wrap Compression Stockings Add-Ons TCC size 4 Discharge Instruction: last layer applied by physician. Electronic Signature(s) Signed: 04/19/2023 4:10:29 PM By: Thayer Dallas Entered By: Thayer Dallas on 04/19/2023 13:16:56 -------------------------------------------------------------------------------- Vitals Details Patient Name: Date of Service: [name] Bushy C. 04/19/2023 12:45 PM Medical Record Number: [id number] Patient Account Number: [id number] Date of Birth/Sex: Treating RN: [birth year] (61 y.o. [name] Primary Care Debie Ashline: [name] Other Clinician: Referring Javelle Donigan: Treating Shady Bradish/Extender: [name]8 Vital Signs Time Taken: 12:59 Temperature (F): 98.3 Height (in): 66 Pulse (bpm): 93 Weight (lbs): 339 Respiratory Rate (breaths/min): 18 Body Mass Index (BMI): 54.7 Blood Pressure (mmHg): 135/72 Capillary Blood Glucose (mg/dl): 782 Reference Range: 80 - 120 mg / dl Electronic Signature(s) Signed: 04/19/2023 4:41:34 PM By: Verline Lema, Tajha C ([id number]) PM By: Margaretmary Lombard.pdf Page 8 of 8 Signed: 04/19/2023 4:41:34 aylor Entered By: Samuella Bruin on 04/19/2023 12:59:38

## 2023-04-27 ENCOUNTER — Encounter (HOSPITAL_BASED_OUTPATIENT_CLINIC_OR_DEPARTMENT_OTHER): Payer: 59 | Admitting: Internal Medicine

## 2023-04-27 DIAGNOSIS — E11621 Type 2 diabetes mellitus with foot ulcer: Secondary | ICD-10-CM | POA: Diagnosis not present

## 2023-04-27 DIAGNOSIS — L97522 Non-pressure chronic ulcer of other part of left foot with fat layer exposed: Secondary | ICD-10-CM

## 2023-04-27 NOTE — Progress Notes (Signed)
[name], [name] ([id number]) [id number] Page 1 of 11 Visit Report for 04/27/2023 Chief Complaint Document Details Patient Name: Date of Service: [name]. 04/27/2023 11:00 [name] Medical Record Number: [id number] Patient Account Number: [id number] Date of Birth/Sex: Treating RN: Jun 01, 1961 (62 y.o. F) Primary Care Provider: [name] Other Clinician: Referring Provider: Treating Provider/Extender: [name] in Treatment: 03 Information Obtained from: Patient Chief Complaint 03/05/2022; left foot wound Electronic Signature(s) Signed: 04/27/2023 4:26:53 PM By: Geralyn Corwin DO Entered By: Geralyn Corwin on 04/27/2023 11:56:00 -------------------------------------------------------------------------------- HPI Details Patient Name: Date of Service: [name]. 04/27/2023 11:00 [name] Medical Record Number: [id number] Patient Account Number: [id number] Date of Birth/Sex: Treating RN: 05/13/1961 (62 y.o. F) Primary Care Provider: [name] Other Clinician: Referring Provider: Treating Provider/Extender: [name] in Treatment: 31 History of Present Illness HPI Description: Admission 03/05/2022 [name] is [name] 62 year old female with [name] past medical history of uncontrolled insulin-dependent type 2 diabetes, tobacco user and chronic diastolic heart failure that presents to the clinic for [name] 41-month history of wound to her left heel. She states she had [name] left ankle fusion in September 2022. She states that she always had [name] wound after the surgery and it never healed. She has home health and they have been doing compression wraps along with silver alginate to the wound bed. She currently denies signs of infection. 6/8; this is [name] 62 year old woman with type 2 diabetes. She developed [name] wound on her left Achilles heel just above the tip of the heel in the setting of recurrent ankle  surgeries in late 2022. I have seen some of these results from either the cast or the surgical boots that are put on after these operations. She thinks this may be the case. And [name] sense of pressure ulcer. In any case that she has been using Santyl Hydrofera Blue under compression. She is not wearing any footwear at home as she cannot find anything to accommodate the wrap 6/22; patient presents for follow-up. She has home health that comes out once [name] week to help with dressing changes. She has no issues or complaints today. We have been using Hydrofera Blue under compression therapy. 7/6; patient presents for follow-up. She states that home health did not come out for the past 2 weeks. It is unclear why. We have been using Hydrofera Blue and Santyl under compression therapy. 7/13; patient presents for follow-up. She did not take the oral antibiotics prescribed at last clinic visit. We have been using Hydrofera Blue with gentamicin/mupirocin ointment under compression therapy. She has no issues or complaints today. She denies signs of infection. 7/20; patient presents for follow-up. We have been using Hydrofera Blue with antibiotic ointment under 3 layer compression. She has home health that change the dressing once. They put collagen on the wound bed. She also reports falling yesterday and hitting her right foot. She has no pain to the area today. 7/27; patient presents for follow-up. We have been using Hydrofera Blue under 3 layer compression. She has home health that changes the dressing. She has no issues or complaints today. 8/3; patient presents for follow-up. We continues to use Hydrofera Blue under 3 layer compression. She has no issues or complaints today. She has been approved for Epicord at 100%. 8/11; patient presents for follow-up. We have been using Hydrofera Blue under 3 layer compression. She has been approved for Epicord and we have this today. She is in  agreement with having this  applied. [name], [name] ([id number]) [id number] Page 2 of 11 8/18; patient presents for follow-up. Epicord #1 was placed in standard fashion last clinic visit. She has no issues or complaints today. 9/18; Unfortunately patient has missed her last clinic appointments due to falling and breaking her right ankle. We have been following her for her left ankle wound. She has also developed [name] large blister to the left heel over the past week that has ruptured and dried. She currently resides In [facility]. Wound9/25; patient presents for follow-up. We have been using Hydrofera Blue and Santyl to the original left ankle wound and Xeroform to the dried blistered. We have been wrapping her with compression therapy. She resides in [name] facility and they changed the wrap once this past week. She has no issues or complaints today. She denies signs of infection. 10/3; patient presents for follow-up. We have been using Xeroform to the heel wound and Epicort to the left ankle wound All under compression therapy. She has no issues or complaints today. 10/10; patient presents for follow-up. We have been using Epicort to the left ankle wound and Santyl and Hydrofera Blue to the heel wound. All under compression therapy. she has no issues or complaints today. 10/16; patient presents for follow-up. Epicort was placed in standard fashion to the superior wound and onto the heel and Santyl and Hydrofera Blue. She states that the wrap got wet during her shower and her facility was able to rewrap with Kerlix/Coban. 10/23; patient presents for follow-up. Epicord was placed to the wound beds last clinic visit. We continue Kerlix/Coban. She has no issues or complaints today. 10/31; Patient presents for follow-up. Epicord was placed to the wound beds last clinic visit. The original wound is almost healed. We have done this under Kerlix/Coban. She denies signs of infection. 11/10; patient presents  for follow-up. Patient's last epi cord was placed in standard fashion at last clinic visit. The original wound has healed. She still has [name] heel wound. Grafix was approved however too costly for the patient. We will rerun epi cord to see if she can have more applications. She had done very well with this. We have been using Hydrofera Blue to the heel under Kerlix/Coban. 11/21; patient presents for follow-up. We have been using Hydrofera Blue and Santyl to the heel under Kerlix/Coban. She switched to [name] new healthcare insurance and again The skin substitute is too costly for the patient. She denies signs of infection. 11/28; patient presents for follow-up. Her facility did not change the wrap last clinic visit due to lack of staffing. The wrap was taken off and Hydrofera Blue dressing has been in place for the past week. She has no issues or complaints today. 12/5; patient presents for follow-up. She states she has had more drainage over the past week. They did not change the wrap at her facility. She states the nurse will be back this week. She is also been doing [name] lot of physical therapy. She has been using the Prevalon boot while in bed. 12/12; patient presents for follow-up. We have been using Hydrofera Blue with antibiotic ointment under 4-layer compression. She is tolerated the wrap well. She states she is using her Prevalon boot while in the wheelchair. She has no issues or complaints today. There is been improvement in wound healing. 12/19; patient presents for follow-up. We have been using Hydrofera Blue and antibiotic ointment to the wound bed under 4-layer compression. Her facility change the wrap  once. She states she has been using the Prevalon boot in bed but not in the wheelchair due to issues with transferring. Overall there is still improvement in wound healing. 1/16; patient presents for follow-up. She missed her last clinic appointment. We have been using Hydrofera Blue and antibiotic  ointment under 4-layer compression. At the facility this has only been changed twice over the past 3 weeks. They are not using 4-layer compression. She came in with Kerlix/Coban. 1/23; patient presents for follow-up. We have been using Santyl and Hydrofera Blue under 4-layer compression. Patient has no issues or complaints today. 2/1; patient presents for follow-up. Patient's been using Santyl and Hydrofera Blue to the wound bed. She has developed more slough and now [name] mild odor. She states she is wearing the Prevalon boot at night. It is unclear if she is offloading this area during the day. 2/13; patient presents for follow-up. Patient's been using Dakin's wet-to-dry dressings. She received her Keystone antibiotic ointment in the mail. She has not used this yet. She reports using her Prevalon boot. She has no issues or complaints today. 2/20; patient presents for follow-up. She has been using Keystone antibiotic ointment with Hydrofera Blue. She has no issues or complaints today. Due to transportation she cannot do the total contact cast today. She is scheduled for next week to have this placed. 2/27; patient presents for follow-up. Plan is for the total contact cast today. We have been using Keystone and Hydrofera Blue to the wound bed. She forgot her Keystone antibiotic ointment. She has no issues or complaints today. 2/29; patient presents for follow-up. Patient presents for her obligatory cast change. She has no issues or complaints today. 3/5; patient presents for follow-up. She has had the cast in place for the past week and has tolerated this well. She has no issues or complaints today. We have been using PolyMem with Keystone antibiotic ointment. 12/15/2022: The wound is quite clean and measured smaller today. 3/19; we have been using Keystone antibiotic ointment with PolyMem silver under the total contact cast. Overall there is been improvement in wound healing. 3/26; patient presents for  follow-up. She is been approved for epi fix. She would like to proceed with this today. Previously we have been using Keystone antibiotic ointment with PolyMem silver under the total contact cast. She has no issues or complaints. 01/05/2023: The wound is clean and she is tolerating total contact casting without difficulty. She had her first application of EpiFix last week. 4/9; patient presents for follow-up. We have been applying EpiFix under the total contact cast for the past 2 weeks. She is tolerated this well. She has no issues or complaints today. 4/16; patient presents for follow-up. We have been using EpiFix under the total contact cast. She has done well with this. Wound is smaller. She does have slough buildup. 4/23; patient presents for follow-up. We have been using EpiFix under the total contact cast. Wound appears well-healing. She does have slough buildup. 4/30; patient presents for follow-up. We have been using EpiFix under the total contact cast. Wound is smaller. She again has slough buildup. 5/6; patient with [name] left heel ulcer type II diabetic. We applied EpiFix #7 under [name] total contact cast. Her wound is making good progress. 5/14; patient presents for follow-up. We have been using EpiFix under the total contact casting wound is smaller. 5/21; patient presents for follow-up. We have been using EpiFix under the total contact cast. The wound is smaller. She has no issues or  complaints today. 5/28; patient presents for follow-up. We have been using EpiFix under the total contact cast. Wound is smaller. [name], [name] ([id number]) [id number] Page 3 of 11 6/11; patient presents for follow-up. We have been using EpiFix under the total contact cast. Wound is stable. 6/17; patient presents for follow-up. We have been using EpiFix under the total contact cast. Wound is smaller. 6/25; patient presents for follow-up. We have been using EpiFix under the total contact  cast. Wound continues to improve in size and is much smaller. 7/1; patient presents for follow-up. We have been using EpiFix under the total contact cast. Wound is almost healed. 7/9; patient presents for follow-up. We have been using EpiFix under the total contact cast. Wound is still present but very small. 7/15; patient presents for follow-up. We have been using EpiFix under the total contact cast. Wound is almost healed. 7/23; patient presents for follow-up. We use EpiFix under the total contact cast at last clinic visit. The wound has healed. Electronic Signature(s) Signed: 04/27/2023 4:26:53 PM By: Geralyn Corwin DO Entered By: Geralyn Corwin on 04/27/2023 11:56:20 -------------------------------------------------------------------------------- Physical Exam Details Patient Name: Date of Service: Sharol Harness MY [name]. 04/27/2023 11:00 [name] Medical Record Number: [id number] Patient Account Number: [id number] Date of Birth/Sex: Treating RN: Dec 30, 1960 (62 y.o. F) Primary Care Provider: [name] Other Clinician: Referring Provider: Treating Provider/Extender: [name]9 Constitutional respirations regular, non-labored and within target range for patient.. Cardiovascular 2+ dorsalis pedis/posterior tibialis pulses. Psychiatric pleasant and cooperative. Notes Epithelization to the previous wound site on the heel. No signs of surrounding infection. Electronic Signature(s) Signed: 04/27/2023 4:26:53 PM By: Geralyn Corwin DO Entered By: Geralyn Corwin on 04/27/2023 11:57:02 -------------------------------------------------------------------------------- Physician Orders Details Patient Name: Date of Service: [name]. 04/27/2023 11:00 [name] Medical Record Number: [id number] Patient Account Number: [id number] Date of Birth/Sex: Treating RN: June 06, 1961 (62 y.o. [name] Primary Care Provider: [name] Other  Clinician: Referring Provider: Treating Provider/Extender: [name] in Treatment: 72 Verbal / Phone Orders: No Diagnosis Coding Follow-up Appointments ppointment in 1 week. - Dr. Mikey Bussing - room 8 Return [name] Other: - Leave dressing in place. Facility not to change dressing. [name], [name] ([id number]) [id number] Page 4 of 11 Anesthetic (In clinic) Topical Lidocaine 5% applied to wound bed (In clinic) Topical Lidocaine 4% applied to wound bed Cellular or Tissue Based Products Cellular or Tissue Based Product Type: - Run IVR for EpiFix and Epicord=100% covered 05/07/22: Epicord ordered 12/08/2022: Run insurance for epicord and theraskin. 01/19/2023: Run for organogenesis- puraply AM daptic or Mepitel. (DO NOT REMOVE). - Cellular or Tissue Based Product applied to wound bed, secured with steri-strips, cover with [name] Epicord #1 05/15/25 Epicord #2 05/22/22 Epicord #3 06/29/2022 Epicord #4 07/07/2022 Epifix #5 07/14/22 Epicord #6 07/20/22 Epicord # 7 07/27/22 ***Run for Grafix***PENDING Epicord #8 08/04/2022 2024-IVR epifix approved 12/29/2022 #1 epifix applied to left heel. 01/05/2023 #2 epifix applied to left heel. 01/12/2023 #3 epifix applied to left heel 01/19/2023 #4 epifix applied to left heel. 01/26/23 Epifix #5 02/08/2023 #7 epifix 02/16/2023 #8 epifix 02/23/2023 #9 epifix 03/02/23 #10 epifix 03/09/2023 #11 epifix 18mm disk 03/16/2023 #12 epifix 18mm Disk 03/22/2023 #13 epifix 18mm DIsk 03/30/2023 #14 epifix 18mm Disk 04/05/2023 #15 epifix 18mm Disk 04/13/2023 #16 Epifix 2x3 04/19/2023 #17 Epifix 18 mm disk Bathing/ Shower/ Hygiene May shower with protection but do not get wound dressing(s) wet. Protect dressing(s) with water repellant cover (for example,  large plastic bag) or [name] cast cover and may then take shower. Edema Control - Lymphedema / SCD / Other Elevate legs to the level of the heart or above for 30 minutes daily and/or when  sitting for 3-4 times [name] day throughout the day. Avoid standing for long periods of time. Off-Loading Total Contact Cast to Left Lower Extremity - size 4 Additional Orders / Instructions Follow Nutritious Diet - Monitor/Control Blood Sugar Wound Treatment Wound #2 - Calcaneus Wound Laterality: Left, Distal Cleanser: Soap and Water 1 x Per Week/30 Days Discharge Instructions: May shower and wash wound with dial antibacterial soap and water prior to dressing change. Cleanser: Wound Cleanser 1 x Per Week/30 Days Discharge Instructions: Cleanse the wound with wound cleanser prior to applying [name] clean dressing using gauze sponges, not tissue or cotton balls. Prim Dressing: Xeroform Occlusive Gauze Dressing, 4x4 in 1 x Per Week/30 Days ary Discharge Instructions: Apply to wound bed as instructed Secondary Dressing: ABD Pad, 8x10 1 x Per Week/30 Days Discharge Instructions: Apply over primary dressing as directed. Secured With: [employer], 4.5x3.1 (in/yd) 1 x Per Week/30 Days Discharge Instructions: Secure with Kerlix as directed. Add-Ons: TCC size 4 1 x Per Week/30 Days Discharge Instructions: last layer applied by physician. Electronic Signature(s) Signed: 04/27/2023 4:26:53 PM By: Geralyn Corwin DO Entered By: Geralyn Corwin on 04/27/2023 11:57:11 [name], [name] ([id number]) [id number] Page 5 of 11 -------------------------------------------------------------------------------- Problem List Details Patient Name: Date of Service: [name]. 04/27/2023 11:00 [name] Medical Record Number: [id number] Patient Account Number: [id number] Date of Birth/Sex: Treating RN: 12/19/1960 (62 y.o. F) Primary Care Provider: [name] Other Clinician: Referring Provider: Treating Provider/Extender: [name] in Treatment: 34 Active Problems ICD-10 Encounter Code Description Active Date MDM Diagnosis L97.522  Non-pressure chronic ulcer of other part of left foot with fat layer exposed 03/05/2022 No Yes E11.621 Type 2 diabetes mellitus with foot ulcer 03/05/2022 No Yes E66.01 Morbid (severe) obesity due to excess calories 03/05/2022 No Yes Inactive Problems Resolved Problems Electronic Signature(s) Signed: 04/27/2023 4:26:53 PM By: Geralyn Corwin DO Entered By: Geralyn Corwin on 04/27/2023 11:55:48 -------------------------------------------------------------------------------- Progress Note Details Patient Name: Date of Service: [name]. 04/27/2023 11:00 [name] Medical Record Number: [id number] Patient Account Number: [id number] Date of Birth/Sex: Treating RN: 1961-07-07 (62 y.o. F) Primary Care Provider: [name] Other Clinician: Referring Provider: Treating Provider/Extender: [name]9 Subjective Chief Complaint Information obtained from Patient 03/05/2022; left foot wound History of Present Illness (HPI) Admission 03/05/2022 Ms. Ritha Dirden is [name] 62 year old female with [name] past medical history of uncontrolled insulin-dependent type 2 diabetes, tobacco user and chronic diastolic heart failure that presents to the clinic for [name] 6-month history of wound to her left heel. She states she had [name] left ankle fusion in September 2022. She states that she always had [name] wound after the surgery and it never healed. She has home health and they have been doing compression wraps along with silver alginate to the wound bed. She currently denies signs of infection. 6/8; this is [name] 62 year old woman with type 2 diabetes. She developed [name] wound on her left Achilles heel just above the tip of the heel in the setting of recurrent ankle surgeries in late 2022. I have seen some of these results from either the cast or the surgical boots that are put on after these operations. She thinks this may be the case. And [name] sense of pressure  ulcer. In any case that she  has been using Santyl Hydrofera Blue under compression. She is not wearing any footwear at home as she cannot find anything to accommodate the wrap Billing, Reeda [name] ([id number]) [id number].pdf Page 6 of 11 6/22; patient presents for follow-up. She has home health that comes out once [name] week to help with dressing changes. She has no issues or complaints today. We have been using Hydrofera Blue under compression therapy. 7/6; patient presents for follow-up. She states that home health did not come out for the past 2 weeks. It is unclear why. We have been using Hydrofera Blue and Santyl under compression therapy. 7/13; patient presents for follow-up. She did not take the oral antibiotics prescribed at last clinic visit. We have been using Hydrofera Blue with gentamicin/mupirocin ointment under compression therapy. She has no issues or complaints today. She denies signs of infection. 7/20; patient presents for follow-up. We have been using Hydrofera Blue with antibiotic ointment under 3 layer compression. She has home health that change the dressing once. They put collagen on the wound bed. She also reports falling yesterday and hitting her right foot. She has no pain to the area today. 7/27; patient presents for follow-up. We have been using Hydrofera Blue under 3 layer compression. She has home health that changes the dressing. She has no issues or complaints today. 8/3; patient presents for follow-up. We continues to use Hydrofera Blue under 3 layer compression. She has no issues or complaints today. She has been approved for Epicord at 100%. 8/11; patient presents for follow-up. We have been using Hydrofera Blue under 3 layer compression. She has been approved for Epicord and we have this today. She is in agreement with having this applied. 8/18; patient presents for follow-up. Epicord #1 was placed in standard fashion last clinic visit. She has no issues or complaints  today. 9/18; Unfortunately patient has missed her last clinic appointments due to falling and breaking her right ankle. We have been following her for her left ankle wound. She has also developed [name] large blister to the left heel over the past week that has ruptured and dried. She currently resides In [facility]. Wound9/25; patient presents for follow-up. We have been using Hydrofera Blue and Santyl to the original left ankle wound and Xeroform to the dried blistered. We have been wrapping her with compression therapy. She resides in [name] facility and they changed the wrap once this past week. She has no issues or complaints today. She denies signs of infection. 10/3; patient presents for follow-up. We have been using Xeroform to the heel wound and Epicort to the left ankle wound All under compression therapy. She has no issues or complaints today. 10/10; patient presents for follow-up. We have been using Epicort to the left ankle wound and Santyl and Hydrofera Blue to the heel wound. All under compression therapy. she has no issues or complaints today. 10/16; patient presents for follow-up. Epicort was placed in standard fashion to the superior wound and onto the heel and Santyl and Hydrofera Blue. She states that the wrap got wet during her shower and her facility was able to rewrap with Kerlix/Coban. 10/23; patient presents for follow-up. Epicord was placed to the wound beds last clinic visit. We continue Kerlix/Coban. She has no issues or complaints today. 10/31; Patient presents for follow-up. Epicord was placed to the wound beds last clinic visit. The original wound is almost healed. We have done this under Kerlix/Coban. She denies signs of infection.  11/10; patient presents for follow-up. Patient's last epi cord was placed in standard fashion at last clinic visit. The original wound has healed. She still has [name] heel wound. Grafix was approved however too costly for the patient. We will rerun  epi cord to see if she can have more applications. She had done very well with this. We have been using Hydrofera Blue to the heel under Kerlix/Coban. 11/21; patient presents for follow-up. We have been using Hydrofera Blue and Santyl to the heel under Kerlix/Coban. She switched to [name] new healthcare insurance and again The skin substitute is too costly for the patient. She denies signs of infection. 11/28; patient presents for follow-up. Her facility did not change the wrap last clinic visit due to lack of staffing. The wrap was taken off and Hydrofera Blue dressing has been in place for the past week. She has no issues or complaints today. 12/5; patient presents for follow-up. She states she has had more drainage over the past week. They did not change the wrap at her facility. She states the nurse will be back this week. She is also been doing [name] lot of physical therapy. She has been using the Prevalon boot while in bed. 12/12; patient presents for follow-up. We have been using Hydrofera Blue with antibiotic ointment under 4-layer compression. She is tolerated the wrap well. She states she is using her Prevalon boot while in the wheelchair. She has no issues or complaints today. There is been improvement in wound healing. 12/19; patient presents for follow-up. We have been using Hydrofera Blue and antibiotic ointment to the wound bed under 4-layer compression. Her facility change the wrap once. She states she has been using the Prevalon boot in bed but not in the wheelchair due to issues with transferring. Overall there is still improvement in wound healing. 1/16; patient presents for follow-up. She missed her last clinic appointment. We have been using Hydrofera Blue and antibiotic ointment under 4-layer compression. At the facility this has only been changed twice over the past 3 weeks. They are not using 4-layer compression. She came in with Kerlix/Coban. 1/23; patient presents for follow-up. We  have been using Santyl and Hydrofera Blue under 4-layer compression. Patient has no issues or complaints today. 2/1; patient presents for follow-up. Patient's been using Santyl and Hydrofera Blue to the wound bed. She has developed more slough and now [name] mild odor. She states she is wearing the Prevalon boot at night. It is unclear if she is offloading this area during the day. 2/13; patient presents for follow-up. Patient's been using Dakin's wet-to-dry dressings. She received her Keystone antibiotic ointment in the mail. She has not used this yet. She reports using her Prevalon boot. She has no issues or complaints today. 2/20; patient presents for follow-up. She has been using Keystone antibiotic ointment with Hydrofera Blue. She has no issues or complaints today. Due to transportation she cannot do the total contact cast today. She is scheduled for next week to have this placed. 2/27; patient presents for follow-up. Plan is for the total contact cast today. We have been using Keystone and Hydrofera Blue to the wound bed. She forgot her Keystone antibiotic ointment. She has no issues or complaints today. 2/29; patient presents for follow-up. Patient presents for her obligatory cast change. She has no issues or complaints today. 3/5; patient presents for follow-up. She has had the cast in place for the past week and has tolerated this well. She has no issues or complaints  today. We have been using PolyMem with Keystone antibiotic ointment. 12/15/2022: The wound is quite clean and measured smaller today. 3/19; we have been using Keystone antibiotic ointment with PolyMem silver under the total contact cast. Overall there is been improvement in wound healing. 3/26; patient presents for follow-up. She is been approved for epi fix. She would like to proceed with this today. Previously we have been using [facility], Anelle [name] ([id number]) [phone].pdf Page 7 of 11 antibiotic  ointment with PolyMem silver under the total contact cast. She has no issues or complaints. 01/05/2023: The wound is clean and she is tolerating total contact casting without difficulty. She had her first application of EpiFix last week. 4/9; patient presents for follow-up. We have been applying EpiFix under the total contact cast for the past 2 weeks. She is tolerated this well. She has no issues or complaints today. 4/16; patient presents for follow-up. We have been using EpiFix under the total contact cast. She has done well with this. Wound is smaller. She does have slough buildup. 4/23; patient presents for follow-up. We have been using EpiFix under the total contact cast. Wound appears well-healing. She does have slough buildup. 4/30; patient presents for follow-up. We have been using EpiFix under the total contact cast. Wound is smaller. She again has slough buildup. 5/6; patient with [name] left heel ulcer type II diabetic. We applied EpiFix #7 under [name] total contact cast. Her wound is making good progress. 5/14; patient presents for follow-up. We have been using EpiFix under the total contact casting wound is smaller. 5/21; patient presents for follow-up. We have been using EpiFix under the total contact cast. The wound is smaller. She has no issues or complaints today. 5/28; patient presents for follow-up. We have been using EpiFix under the total contact cast. Wound is smaller. 6/11; patient presents for follow-up. We have been using EpiFix under the total contact cast. Wound is stable. 6/17; patient presents for follow-up. We have been using EpiFix under the total contact cast. Wound is smaller. 6/25; patient presents for follow-up. We have been using EpiFix under the total contact cast. Wound continues to improve in size and is much smaller. 7/1; patient presents for follow-up. We have been using EpiFix under the total contact cast. Wound is almost healed. 7/9; patient presents for follow-up. We  have been using EpiFix under the total contact cast. Wound is still present but very small. 7/15; patient presents for follow-up. We have been using EpiFix under the total contact cast. Wound is almost healed. 7/23; patient presents for follow-up. We use EpiFix under the total contact cast at last clinic visit. The wound has healed. Patient History Information obtained from Patient, Chart. Family History Cancer - Father,Mother, Diabetes - Mother,Maternal Grandparents,Paternal Grandparents, Heart Disease - Siblings,Maternal Grandparents, Hypertension - Siblings, Tuberculosis - Maternal Grandparents,Paternal Grandparents. Social History Current every day smoker, Marital Status - Single, Alcohol Use - Never, Drug Use - No History, Caffeine Use - Daily. 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 Vitals Time Taken: 11:05 AM,  Height: 66 in, Weight: 339 lbs, BMI: 54.7, Temperature: 98.6 F, Pulse: 86 bpm, Respiratory Rate: 20 breaths/min, Blood Pressure: 98/50 mmHg, Capillary Blood Glucose: 138 mg/dl. Cardiovascular 2+ dorsalis pedis/posterior tibialis pulses. Psychiatric pleasant and cooperative. [name], [name] ([id number]) [id number] Page 8 of 11 General Notes: Epithelization to the previous wound site on the heel. No signs of surrounding  infection. Integumentary (Hair, Skin) Wound #2 status is Open. Original cause of wound was Blister. The date acquired was: 06/15/2022. The wound has been in treatment 43 weeks. The wound is located on the Left,Distal Calcaneus. The wound measures 0cm length x 0cm width x 0cm depth; 0cm^2 area and 0cm^3 volume. There is no tunneling or undermining noted. There is [name] none present amount of drainage noted. The wound margin is distinct with the outline attached to the wound base. There is no granulation within the wound bed. There is no necrotic tissue within the wound bed. The periwound skin appearance had no abnormalities noted for texture. The periwound skin appearance had no abnormalities noted for color. The periwound skin appearance did not exhibit: Dry/Scaly, Maceration. Periwound temperature was noted as No Abnormality. Assessment Active Problems ICD-10 Non-pressure chronic ulcer of other part of left foot with fat layer exposed Type 2 diabetes mellitus with foot ulcer Morbid (severe) obesity due to excess calories Patient has done well with EpiFix and the total contact cast. Her wound is healed. This is [name] freshly healed wound and she would benefit from 1 more week of aggressive offloading with [name] total contact cast to assure closure. Patient was agreeable with this. Will use Xeroform under the total contact cast. Procedures Wound #2 Pre-procedure diagnosis of Wound #2 is [name] Diabetic Wound/Ulcer of the Lower Extremity located on the Left,Distal Calcaneus . There was [name] T Research scientist (life sciences) otal Procedure by Geralyn Corwin, DO. Post procedure Diagnosis Wound #2: Same as Pre-Procedure Notes: TCC szie 4. Plan Follow-up Appointments: Return Appointment in 1 week. - Dr. Mikey Bussing - room 8 Other: - Leave dressing in place. Facility not to change dressing. Anesthetic: (In clinic) Topical Lidocaine 5% applied to wound bed (In clinic) Topical Lidocaine 4% applied to wound bed Cellular or Tissue Based  Products: Cellular or Tissue Based Product Type: - Run IVR for EpiFix and Epicord=100% covered 05/07/22: Epicord ordered 12/08/2022: Run insurance for epicord and theraskin. 01/19/2023: Run for organogenesis- puraply AM Cellular or Tissue Based Product applied to wound bed, secured with steri-strips, cover with Adaptic or Mepitel. (DO NOT REMOVE). - Epicord #1 05/15/25 Epicord #2 05/22/22 Epicord #3 06/29/2022 Epicord #4 07/07/2022 Epifix #5 07/14/22 Epicord #6 07/20/22 Epicord # 7 07/27/22 ***Run for Grafix***PENDING Epicord #8 08/04/2022 2024-IVR epifix approved 12/29/2022 #1 epifix applied to left heel. 01/05/2023 #2 epifix applied to left heel. 01/12/2023 #3 epifix applied to left heel 01/19/2023 #4 epifix applied to left heel. 01/26/23 Epifix #5 02/08/2023 #7 epifix 02/16/2023 #8 epifix 02/23/2023 #9 epifix 03/02/23 #10 epifix 03/09/2023 #11 epifix 18mm disk 03/16/2023 #12 epifix 18mm Disk 03/22/2023 #13 epifix 18mm DIsk 03/30/2023 #14 epifix 18mm Disk 04/05/2023 #15 epifix 18mm Disk 04/13/2023 #16 Epifix 2x3 04/19/2023 #17 Epifix 18 mm disk Bathing/ Shower/ Hygiene: May shower with protection but do not get wound dressing(s) wet. Protect dressing(s) with water repellant cover (for example, large plastic bag) or [name] cast cover and may then take shower. Edema Control - Lymphedema / SCD / Other: Elevate legs to the level of the heart or above for 30 minutes daily and/or when sitting for 3-4 times [name] day  throughout the day. Avoid standing for long periods of time. Off-Loading: T Contact Cast to Left Lower Extremity - size 4 otal Additional Orders / Instructions: Follow Nutritious Diet - Monitor/Control Blood Sugar WOUND #2: - Calcaneus Wound Laterality: Left, Distal Cleanser: Soap and Water 1 x Per Week/30 Days Discharge Instructions: May shower and wash wound with dial antibacterial soap and water prior to dressing change. Cleanser: Wound Cleanser 1 x Per Week/30 Days Discharge Instructions: Cleanse the wound with wound  cleanser prior to applying [name] clean dressing using gauze sponges, not tissue or cotton balls. Prim Dressing: Xeroform Occlusive Gauze Dressing, 4x4 in 1 x Per Week/30 Days ary Discharge Instructions: Apply to wound bed as instructed Secondary Dressing: ABD Pad, 8x10 1 x Per Week/30 Days Discharge Instructions: Apply over primary dressing as directed. Secured With: [employer], 4.5x3.1 (in/yd) 1 x Per Week/30 Days Discharge Instructions: Secure with Kerlix as directed. Add-Ons: TCC size 4 1 x Per Week/30 Days Discharge Instructions: last layer applied by physician. [name], [name] ([id number]) [id number] Page 9 of 11 1. T contact cast placed in standard fashion otal 2. Xeroform 3. Follow-up in 1 week Electronic Signature(s) Signed: 04/27/2023 4:26:53 PM By: Geralyn Corwin DO Entered By: Geralyn Corwin on 04/27/2023 11:58:37 -------------------------------------------------------------------------------- HxROS Details Patient Name: Date of Service: [name], [name]. 04/27/2023 11:00 [name] Medical Record Number: [id number] Patient Account Number: [id number] Date of Birth/Sex: Treating RN: [birth year] (62 y.o. F) Primary Care Provider: [name] Other Clinician: Referring Provider: Treating Provider/Extender: [name]9 Information Obtained From Patient Chart Hematologic/Lymphatic Medical History: Positive for: Anemia Respiratory Medical History: Positive for: Chronic Obstructive Pulmonary Disease (COPD) Cardiovascular Medical History: Positive for: Congestive Heart Failure - Weighs self daily; Hypertension; Myocardial Infarction; Peripheral Venous Disease Gastrointestinal Medical History: Past Medical History Notes: GERD, Peptic Ulcer Disease Endocrine Medical History: Positive for: Type II Diabetes Time with diabetes: 16 years Treated with: Insulin, Oral agents Blood sugar tested  every day: Yes Tested : Twice daily Musculoskeletal Medical History: Past Medical History Notes: Broke left leg 3 years ago, 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, foot fused Neurologic Medical History: Positive for: Neuropathy Psychiatric Medical History: Past Medical History Notes: Depression Immunizations [name], [name] ([id number]) [id number] Page 10 of 11 Pneumococcal Vaccine: Received Pneumococcal Vaccination: No Implantable Devices None Family and Social History Cancer: Yes - Father,Mother; Diabetes: Yes - Mother,Maternal Grandparents,Paternal Grandparents; Heart Disease: Yes - Siblings,Maternal Grandparents; Hypertension: Yes - Siblings; Tuberculosis: Yes - Maternal Grandparents,Paternal Grandparents; Current every day smoker; Marital Status - Single; Alcohol Use: Never; Drug Use: No History; Caffeine Use: Daily; Financial Concerns: No; Food, Clothing or Shelter Needs: No; Support System Lacking: No; Transportation Concerns: No Electronic Signature(s) Signed: 04/27/2023 4:26:53 PM By: Geralyn Corwin DO Entered By: Geralyn Corwin on 04/27/2023 11:56:26 -------------------------------------------------------------------------------- Total Contact Cast Details Patient Name: Date of Service: [name]. 04/27/2023 11:00 [name] Medical Record Number: [id number] Patient Account Number: [id number] Date of Birth/Sex: Treating RN: 1961-03-15 (62 y.o. [name] Primary Care Provider: [name] Other Clinician: Referring Provider: Treating Provider/Extender: [name] in Treatment: 83 T Contact Cast Applied for Wound Assessment: otal Wound #2 Left,Distal Calcaneus Performed By: Physician Geralyn Corwin, DO Post Procedure Diagnosis Same as Pre-procedure Notes TCC szie 4 Electronic Signature(s) Signed: 04/27/2023 4:26:53 PM By: Geralyn Corwin DO Signed: 04/27/2023 4:56:09 PM By: Shawn Stall RN, BSN Entered  By: Shawn Stall on 04/27/2023 11:23:59 -------------------------------------------------------------------------------- SuperBill Details Patient Name: Date of Service: [name], [name]. 04/27/2023 Medical Record Number: [id number] Patient Account Number: [id number] Date of Birth/Sex: Treating RN: May 10, 1961 (63 y.o. [name], [name] Primary Care Provider: [name] Other Clinician: Referring Provider: Treating Provider/Extender: [name]9 Diagnosis Coding ICD-10 Codes Code Description [phone] Non-pressure chronic ulcer of other part of left foot with fat layer exposed E11.621 Type 2 diabetes mellitus with foot ulcer E66.01 Morbid (severe) obesity due to excess calories Facility Procedures [name], [name] ([id number]): CPT4 Code Description [id number] - APPLY TOTAL CONTACT LEG CAST ICD-10 Diagnosis Description E11.621 Type 2 diabetes mellitus with foot ulcer L97.522 Non-pressure chronic ulcer of other part of left foot with fat [phone].pdf Page 11 of 11: Modifier Quantity 1 layer exposed Physician Procedures : CPT4 Code Description Modifier [id number] [phone] - WC PHYS APPLY TOTAL CONTACT CAST ICD-10 Diagnosis Description E11.621 Type 2 diabetes mellitus with foot ulcer L97.522 Non-pressure chronic ulcer of other part of left foot with fat layer exposed Quantity: 1 Electronic Signature(s) Signed: 04/27/2023 4:26:53 PM By: Geralyn Corwin DO Entered By: Geralyn Corwin on 04/27/2023 11:59:01

## 2023-04-27 NOTE — Progress Notes (Signed)
Battaglini, Emmaly C ([id number]) [id number] Page 1 of 8 Visit Report for 04/27/2023 Arrival Information Details Patient Name: Date of Service: [name] Bushy C. 04/27/2023 11:00 A M Medical Record Number: [id number] Patient Account Number: [id number] Date of Birth/Sex: Treating RN: 04/01/1961 (62 y.o. [name], [name] Primary Care Amin Fornwalt: [name] Other Clinician: Referring [place] Lasseter: Treating Rhondalyn Clingan/Extender: [name] in Treatment: 12 Visit Information History Since Last Visit Added or deleted any medications: No Patient Arrived: Wheel Chair Any new allergies or adverse reactions: No Arrival Time: 11:01 Had a fall or experienced change in No Accompanied By: self activities of daily living that may affect Transfer Assistance: None risk of falls: Patient Identification Verified: Yes Signs or symptoms of abuse/neglect since last visito No Secondary Verification Process Completed: Yes Hospitalized since last visit: No Patient Requires Transmission-Based Precautions: No Implantable device outside of the clinic excluding No Patient Has Alerts: No cellular tissue based products placed in the center since last visit: Has Dressing in Place as Prescribed: Yes Has Footwear/Offloading in Place as Prescribed: Yes Left: T Contact Cast otal Pain Present Now: No Electronic Signature(s) Signed: 04/27/2023 4:56:09 PM By: Shawn Stall RN, BSN Entered By: Shawn Stall on 04/27/2023 11:13:40 -------------------------------------------------------------------------------- Encounter Discharge Information Details Patient Name: Date of Service: [name] Bushy C. 04/27/2023 11:00 A M Medical Record Number: [id number] Patient Account Number: [id number] Date of Birth/Sex: Treating RN: 1961/03/13 (62 y.o. [name], [name] Primary Care Lace Chenevert: [name] Other Clinician: Referring Latayna Ritchie: Treating Teshaun Olarte/Extender: [name] in Treatment: 78 Encounter Discharge Information Items Discharge Condition: Stable Ambulatory Status: Wheelchair Discharge Destination: Home Transportation: Private Auto Accompanied By: self Schedule Follow-up Appointment: Yes Clinical Summary of Care: Electronic Signature(s) Signed: 04/27/2023 4:56:09 PM By: Shawn Stall RN, BSN Entered By: Shawn Stall on 04/27/2023 11:25:49 Oettinger, Charlye C ([id number]) [id number] Page 2 of 8 -------------------------------------------------------------------------------- Lower Extremity Assessment Details Patient Name: Date of Service: [name] Bushy C. 04/27/2023 11:00 A M Medical Record Number: [id number] Patient Account Number: [id number] Date of Birth/Sex: Treating RN: 04/21/1961 (62 y.o. [name], [name] Primary Care Lithzy Bernard: [name] Other Clinician: Referring Magie Ciampa: Treating Abuk Selleck/Extender: [name]9 Edema Assessment Assessed: [name]: Yes] [name]: No] Edema: [Left: Ye] [Right: s] Calf Left: Right: Point of Measurement: 28 cm From Medial Instep 49 cm Ankle Left: Right: Point of Measurement: 3 cm From Medial Instep 32 cm Vascular Assessment Pulses: Dorsalis Pedis Palpable: [Left:Yes] Extremity colors, hair growth, and conditions: Extremity Color: [Left:Normal] Hair Growth on Extremity: [Left:No] Capillary Refill: [Left:< 3 seconds] Dependent Rubor: [Left:No] Blanched when Elevated: [Left:No No] Toe Nail Assessment Left: Right: Thick: No Discolored: Yes Deformed: No Improper Length and Hygiene: Yes Electronic Signature(s) Signed: 04/27/2023 4:56:09 PM By: Shawn Stall RN, BSN Entered By: Shawn Stall on 04/27/2023 11:14:37 -------------------------------------------------------------------------------- Multi Wound Chart Details Patient Name: Date of Service: [name] Bushy C. 04/27/2023 11:00 A  M Medical Record Number: [id number] Patient Account Number: [id number] Date of Birth/Sex: Treating RN: 1961/02/21 (61 y.o. F) Primary Care Lakeeta Dobosz: [name] Other Clinician: Referring Sakina Briones: Treating Kenechukwu Eckstein/Extender: [name]9 Vital Signs Height(in): 66 Capillary Blood Glucose(mg/dl): 073 Weight(lbs): 710 Pulse(bpm): 86 Body Mass Index(BMI): 54.7 Blood Pressure(mmHg): 98/50 Temperature(F): 98.6 Respiratory Rate(breaths/min): 20 Igo, Sherryl C ([id number]) [phone].pdf Page 3 of 8 [2:Photos:] [N/A:N/A] Left, Distal Calcaneus N/A N/A Wound Location: Blister N/A N/A Wounding Event: Diabetic Wound/Ulcer of the Lower N/A N/A Primary Etiology: Extremity Anemia, Chronic  Obstructive N/A N/A Comorbid History: Pulmonary Disease (COPD), Congestive Heart Failure, Hypertension, Myocardial Infarction, Peripheral Venous Disease, Type II Diabetes, Neuropathy 06/15/2022 N/A N/A Date Acquired: 30 N/A N/A Weeks of Treatment: Open N/A N/A Wound Status: No N/A N/A Wound Recurrence: 0x0x0 N/A N/A Measurements L x W x D (cm) 0 N/A N/A A (cm) : rea 0 N/A N/A Volume (cm) : 100.00% N/A N/A % Reduction in A rea: 100.00% N/A N/A % Reduction in Volume: Grade 2 N/A N/A Classification: None Present N/A N/A Exudate A mount: Distinct, outline attached N/A N/A Wound Margin: None Present (0%) N/A N/A Granulation A mount: None Present (0%) N/A N/A Necrotic A mount: Fascia: No N/A N/A Exposed Structures: Fat Layer (Subcutaneous Tissue): No Tendon: No Muscle: No Joint: No Bone: No Large (67-100%) N/A N/A Epithelialization: No Abnormalities Noted N/A N/A Periwound Skin Texture: Maceration: No N/A N/A Periwound Skin Moisture: Dry/Scaly: No No Abnormalities Noted N/A N/A Periwound Skin Color: No Abnormality N/A N/A Temperature: T Contact Cast otal N/A N/A Procedures Performed: Treatment  Notes Wound #2 (Calcaneus) Wound Laterality: Left, Distal Cleanser Soap and Water Discharge Instruction: May shower and wash wound with dial antibacterial soap and water prior to dressing change. Wound Cleanser Discharge Instruction: Cleanse the wound with wound cleanser prior to applying a clean dressing using gauze sponges, not tissue or cotton balls. Peri-Wound Care Topical Primary Dressing Xeroform Occlusive Gauze Dressing, 4x4 in Discharge Instruction: Apply to wound bed as instructed Secondary Dressing ABD Pad, 8x10 Discharge Instruction: Apply over primary dressing as directed. Secured With [employer], 4.5x3.1 (in/yd) Discharge Instruction: Secure with Kerlix as directed. Compression Wrap Compression Stockings Adduci, Imelda C ([id number]) [phone] Page 4 of 8 Add-Ons TCC size 4 Discharge Instruction: last layer applied by physician. Electronic Signature(s) Signed: 04/27/2023 4:26:53 PM By: Geralyn Corwin DO Entered By: Geralyn Corwin on 04/27/2023 11:55:53 -------------------------------------------------------------------------------- Multi-Disciplinary Care Plan Details Patient Name: Date of Service: [name] Bushy C. 04/27/2023 11:00 A M Medical Record Number: [id number] Patient Account Number: [id number] Date of Birth/Sex: Treating RN: March 25, 1961 (62 y.o. [name], [name] Primary Care Sapir Lavey: [name] Other Clinician: Referring Aliyanna Wassmer: Treating Sharrie Self/Extender: [name]6 Active Inactive Abuse / Safety / Falls / Self Care Management Nursing Diagnoses: History of Falls Goals: Patient will not experience any injury related to falls Date Initiated: 03/05/2022 Target Resolution Date: 04/30/2023 Goal Status: Active Interventions: Assess fall risk on admission and as needed Assess: immobility, friction, shearing, incontinence upon admission and as needed Assess  impairment of mobility on admission and as needed per policy Notes: 04/30/22: Fall prevention ongoing, recent fall. Wound/Skin Impairment Nursing Diagnoses: Impaired tissue integrity Goals: Patient/caregiver will verbalize understanding of skin care regimen Date Initiated: 03/05/2022 Target Resolution Date: 04/30/2023 Goal Status: Active Ulcer/skin breakdown will have a volume reduction of 30% by week 4 Date Initiated: 03/05/2022 Date Inactivated: 04/30/2022 Target Resolution Date: 05/02/2022 Goal Status: Met Ulcer/skin breakdown will have a volume reduction of 50% by week 8 Date Initiated: 04/30/2022 Date Inactivated: 07/07/2022 Target Resolution Date: 07/04/2022 Unmet Reason: patient sent three Goal Status: Unmet weeks inpatient and rehab before returning to wound center. Interventions: Assess patient/caregiver ability to obtain necessary supplies Assess patient/caregiver ability to perform ulcer/skin care regimen upon admission and as needed Assess ulceration(s) every visit Provide education on smoking Provide education on ulcer and skin care Treatment Activities: Topical wound management initiated : 03/05/2022 Fontan, Winni C ([id number]) [id number] Page 5 of 8 Notes: 04/30/22: Wound care regimen continues. Electronic  Signature(s) Signed: 04/27/2023 4:56:09 PM By: Shawn Stall RN, BSN Entered By: Shawn Stall on 04/27/2023 11:24:49 -------------------------------------------------------------------------------- Pain Assessment Details Patient Name: Date of Service: [name] Bushy C. 04/27/2023 11:00 A M Medical Record Number: [id number] Patient Account Number: [id number] Date of Birth/Sex: Treating RN: 1961-07-30 (62 y.o. [name], [name] Primary Care Raelea Gosse: [name] Other Clinician: Referring Gunhild Bautch: Treating Alessandro Griep/Extender: [name]9 Active Problems Location of Pain Severity and  Description of Pain Patient Has Paino No Site Locations Pain Management and Medication Current Pain Management: Electronic Signature(s) Signed: 04/27/2023 4:56:09 PM By: Shawn Stall RN, BSN Entered By: Shawn Stall on 04/27/2023 11:14:12 -------------------------------------------------------------------------------- Patient/Caregiver Education Details Patient Name: Date of Service: [name], [name]. 7/23/2024andnbsp11:00 A M Medical Record Number: [id number] Patient Account Number: [id number] Date of Birth/Gender: Treating RN: Apr 07, 1961 (62 y.o. [name], [name] Primary Care Physician: [name] Other Clinician: Referring Physician: Treating Physician/Extender: [name] in Treatment: [address] Education Assessment Kesselman, Virginia C ([id number]) [id number] Page 6 of 8 Education Provided To: Patient Education Topics Provided Wound Debridement: Handouts: Wound Debridement Methods: Explain/Verbal Responses: Reinforcements needed Electronic Signature(s) Signed: 04/27/2023 4:56:09 PM By: Shawn Stall RN, BSN Entered By: Shawn Stall on 04/27/2023 11:25:03 -------------------------------------------------------------------------------- Wound Assessment Details Patient Name: Date of Service: [name] Bushy C. 04/27/2023 11:00 A M Medical Record Number: [id number] Patient Account Number: [id number] Date of Birth/Sex: Treating RN: 01-22-1961 (62 y.o. [name], [name] Primary Care Sendy Pluta: [name] Other Clinician: Referring Sulma Ruffino: Treating Deshanae Lindo/Extender: [name]9 Wound Status Wound Number: 2 Primary Diabetic Wound/Ulcer of the Lower Extremity Etiology: Wound Location: Left, Distal Calcaneus Wound Open Wounding Event: Blister Status: Date Acquired: 06/15/2022 Comorbid Anemia, Chronic Obstructive Pulmonary Disease (COPD), Weeks Of Treatment: 43 History:  Congestive Heart Failure, Hypertension, Myocardial Infarction, Clustered Wound: No Peripheral Venous Disease, Type II Diabetes, Neuropathy Photos Wound Measurements Length: (cm) Width: (cm) Depth: (cm) Area: (cm) Volume: (cm) 0 % Reduction in Area: 100% 0 % Reduction in Volume: 100% 0 Epithelialization: Large (67-100%) 0 Tunneling: No 0 Undermining: No Wound Description Classification: Grade 2 Wound Margin: Distinct, outline attached Exudate Amount: None Present Foul Odor After Cleansing: No Slough/Fibrino No Wound Bed Granulation Amount: None Present (0%) Exposed Structure Necrotic Amount: None Present (0%) Fascia Exposed: No Fat Layer (Subcutaneous Tissue) Exposed: No Tendon Exposed: No Muscle Exposed: No Brenton, Hanae C ([id number]) [id number] Page 7 of 8 Joint Exposed: No Bone Exposed: No Periwound Skin Texture Texture Color No Abnormalities Noted: Yes No Abnormalities Noted: Yes Moisture Temperature / Pain No Abnormalities Noted: No Temperature: No Abnormality Dry / Scaly: No Maceration: No Treatment Notes Wound #2 (Calcaneus) Wound Laterality: Left, Distal Cleanser Soap and Water Discharge Instruction: May shower and wash wound with dial antibacterial soap and water prior to dressing change. Wound Cleanser Discharge Instruction: Cleanse the wound with wound cleanser prior to applying a clean dressing using gauze sponges, not tissue or cotton balls. Peri-Wound Care Topical Primary Dressing Xeroform Occlusive Gauze Dressing, 4x4 in Discharge Instruction: Apply to wound bed as instructed Secondary Dressing ABD Pad, 8x10 Discharge Instruction: Apply over primary dressing as directed. Secured With [employer], 4.5x3.1 (in/yd) Discharge Instruction: Secure with Kerlix as directed. Compression Wrap Compression Stockings Add-Ons TCC size 4 Discharge Instruction: last layer applied by physician. Electronic  Signature(s) Signed: 04/27/2023 4:56:09 PM By: Shawn Stall RN, BSN Entered By: Shawn Stall on 04/27/2023 11:18:59 -------------------------------------------------------------------------------- Vitals Details Patient Name: Date of Service:  [name], [name]. 04/27/2023 11:00 A M Medical Record Number: [id number] Patient Account Number: [id number] Date of Birth/Sex: Treating RN: 1961/01/11 (62 y.o. [name], [name] Primary Care Burris Matherne: [name] Other Clinician: Referring Carney Saxton: Treating Fidel Caggiano/Extender: [name]9 Vital Signs Time Taken: 11:05 Temperature (F): 98.6 Height (in): 66 Pulse (bpm): 86 Weight (lbs): 339 Respiratory Rate (breaths/min): 20 Body Mass Index (BMI): 54.7 Blood Pressure (mmHg): 98/50 Capillary Blood Glucose (mg/dl): 914 Reference Range: 80 - 120 mg / dl Delaine, Murdis C ([id number]) [id number] Page 8 of 8 Electronic Signature(s) Signed: 04/27/2023 4:56:09 PM By: Shawn Stall RN, BSN Entered By: Shawn Stall on 04/27/2023 11:14:08

## 2023-05-04 ENCOUNTER — Encounter (HOSPITAL_BASED_OUTPATIENT_CLINIC_OR_DEPARTMENT_OTHER): Payer: 59 | Admitting: Internal Medicine

## 2023-05-04 DIAGNOSIS — E11621 Type 2 diabetes mellitus with foot ulcer: Secondary | ICD-10-CM | POA: Diagnosis not present

## 2023-05-04 DIAGNOSIS — L97522 Non-pressure chronic ulcer of other part of left foot with fat layer exposed: Secondary | ICD-10-CM

## 2023-05-04 NOTE — Progress Notes (Signed)
[name], [name] ([id number]) [id number] Page 1 of 9 Visit Report for 05/04/2023 Chief Complaint Document Details Patient Name: Date of Service: [name] 05/04/2023 8:45 A M Medical Record Number: [id number] Patient Account Number: [id number] Date of Birth/Sex: Treating RN: [birth year] (62 y.o. F) Primary Care Provider: [name] Other Clinician: Referring Provider: Treating Provider/Extender: [name] in Treatment: 60 Information Obtained from: Patient Chief Complaint 03/05/2022; left foot wound Electronic Signature(s) Signed: 05/04/2023 12:36:28 PM By: Geralyn Corwin DO Entered By: Geralyn Corwin on 05/04/2023 09:31:52 -------------------------------------------------------------------------------- HPI Details Patient Name: Date of Service: [name] Bushy C. 05/04/2023 8:45 A M Medical Record Number: [id number] Patient Account Number: [id number] Date of Birth/Sex: Treating RN: 1960-10-26 (62 y.o. F) Primary Care Provider: [name] Other Clinician: Referring Provider: Treating Provider/Extender: [name] in Treatment: 60 History of Present Illness HPI Description: Admission 03/05/2022 [name] is a 62 year old female with a past medical history of uncontrolled insulin-dependent type 2 diabetes, tobacco user and chronic diastolic heart failure that presents to the clinic for a 76-month history of wound to her left heel. She states she had a left ankle fusion in September 2022. She states that she always had a wound after the surgery and it never healed. She has home health and they have been doing compression wraps along with silver alginate to the wound bed. She currently denies signs of infection. 6/8; this is a 62 year old woman with type 2 diabetes. She developed a wound on her left Achilles heel just above the tip of the heel in the setting of recurrent ankle  surgeries in late 2022. I have seen some of these results from either the cast or the surgical boots that are put on after these operations. She thinks this may be the case. And a sense of pressure ulcer. In any case that she has been using Santyl Hydrofera Blue under compression. She is not wearing any footwear at home as she cannot find anything to accommodate the wrap 6/22; patient presents for follow-up. She has home health that comes out once a week to help with dressing changes. She has no issues or complaints today. We have been using Hydrofera Blue under compression therapy. 7/6; patient presents for follow-up. She states that home health did not come out for the past 2 weeks. It is unclear why. We have been using Hydrofera Blue and Santyl under compression therapy. 7/13; patient presents for follow-up. She did not take the oral antibiotics prescribed at last clinic visit. We have been using Hydrofera Blue with gentamicin/mupirocin ointment under compression therapy. She has no issues or complaints today. She denies signs of infection. 7/20; patient presents for follow-up. We have been using Hydrofera Blue with antibiotic ointment under 3 layer compression. She has home health that change the dressing once. They put collagen on the wound bed. She also reports falling yesterday and hitting her right foot. She has no pain to the area today. 7/27; patient presents for follow-up. We have been using Hydrofera Blue under 3 layer compression. She has home health that changes the dressing. She has no issues or complaints today. 8/3; patient presents for follow-up. We continues to use Hydrofera Blue under 3 layer compression. She has no issues or complaints today. She has been approved for Epicord at 100%. 8/11; patient presents for follow-up. We have been using Hydrofera Blue under 3 layer compression. She has been approved for Epicord and we have this today. She is in  agreement with having this  applied. Parcher, Vanda C ([id number]) [id number] Page 2 of 9 8/18; patient presents for follow-up. Epicord #1 was placed in standard fashion last clinic visit. She has no issues or complaints today. 9/18; Unfortunately patient has missed her last clinic appointments due to falling and breaking her right ankle. We have been following her for her left ankle wound. She has also developed a large blister to the left heel over the past week that has ruptured and dried. She currently resides In [facility]. Wound9/25; patient presents for follow-up. We have been using Hydrofera Blue and Santyl to the original left ankle wound and Xeroform to the dried blistered. We have been wrapping her with compression therapy. She resides in a facility and they changed the wrap once this past week. She has no issues or complaints today. She denies signs of infection. 10/3; patient presents for follow-up. We have been using Xeroform to the heel wound and Epicort to the left ankle wound All under compression therapy. She has no issues or complaints today. 10/10; patient presents for follow-up. We have been using Epicort to the left ankle wound and Santyl and Hydrofera Blue to the heel wound. All under compression therapy. she has no issues or complaints today. 10/16; patient presents for follow-up. Epicort was placed in standard fashion to the superior wound and onto the heel and Santyl and Hydrofera Blue. She states that the wrap got wet during her shower and her facility was able to rewrap with Kerlix/Coban. 10/23; patient presents for follow-up. Epicord was placed to the wound beds last clinic visit. We continue Kerlix/Coban. She has no issues or complaints today. 10/31; Patient presents for follow-up. Epicord was placed to the wound beds last clinic visit. The original wound is almost healed. We have done this under Kerlix/Coban. She denies signs of infection. 11/10; patient presents for  follow-up. Patient's last epi cord was placed in standard fashion at last clinic visit. The original wound has healed. She still has a heel wound. Grafix was approved however too costly for the patient. We will rerun epi cord to see if she can have more applications. She had done very well with this. We have been using Hydrofera Blue to the heel under Kerlix/Coban. 11/21; patient presents for follow-up. We have been using Hydrofera Blue and Santyl to the heel under Kerlix/Coban. She switched to a new healthcare insurance and again The skin substitute is too costly for the patient. She denies signs of infection. 11/28; patient presents for follow-up. Her facility did not change the wrap last clinic visit due to lack of staffing. The wrap was taken off and Hydrofera Blue dressing has been in place for the past week. She has no issues or complaints today. 12/5; patient presents for follow-up. She states she has had more drainage over the past week. They did not change the wrap at her facility. She states the nurse will be back this week. She is also been doing a lot of physical therapy. She has been using the Prevalon boot while in bed. 12/12; patient presents for follow-up. We have been using Hydrofera Blue with antibiotic ointment under 4-layer compression. She is tolerated the wrap well. She states she is using her Prevalon boot while in the wheelchair. She has no issues or complaints today. There is been improvement in wound healing. 12/19; patient presents for follow-up. We have been using Hydrofera Blue and antibiotic ointment to the wound bed under 4-layer compression. Her facility change the wrap  once. She states she has been using the Prevalon boot in bed but not in the wheelchair due to issues with transferring. Overall there is still improvement in wound healing. 1/16; patient presents for follow-up. She missed her last clinic appointment. We have been using Hydrofera Blue and antibiotic  ointment under 4-layer compression. At the facility this has only been changed twice over the past 3 weeks. They are not using 4-layer compression. She came in with Kerlix/Coban. 1/23; patient presents for follow-up. We have been using Santyl and Hydrofera Blue under 4-layer compression. Patient has no issues or complaints today. 2/1; patient presents for follow-up. Patient's been using Santyl and Hydrofera Blue to the wound bed. She has developed more slough and now a mild odor. She states she is wearing the Prevalon boot at night. It is unclear if she is offloading this area during the day. 2/13; patient presents for follow-up. Patient's been using Dakin's wet-to-dry dressings. She received her Keystone antibiotic ointment in the mail. She has not used this yet. She reports using her Prevalon boot. She has no issues or complaints today. 2/20; patient presents for follow-up. She has been using Keystone antibiotic ointment with Hydrofera Blue. She has no issues or complaints today. Due to transportation she cannot do the total contact cast today. She is scheduled for next week to have this placed. 2/27; patient presents for follow-up. Plan is for the total contact cast today. We have been using Keystone and Hydrofera Blue to the wound bed. She forgot her Keystone antibiotic ointment. She has no issues or complaints today. 2/29; patient presents for follow-up. Patient presents for her obligatory cast change. She has no issues or complaints today. 3/5; patient presents for follow-up. She has had the cast in place for the past week and has tolerated this well. She has no issues or complaints today. We have been using PolyMem with Keystone antibiotic ointment. 12/15/2022: The wound is quite clean and measured smaller today. 3/19; we have been using Keystone antibiotic ointment with PolyMem silver under the total contact cast. Overall there is been improvement in wound healing. 3/26; patient presents for  follow-up. She is been approved for epi fix. She would like to proceed with this today. Previously we have been using Keystone antibiotic ointment with PolyMem silver under the total contact cast. She has no issues or complaints. 01/05/2023: The wound is clean and she is tolerating total contact casting without difficulty. She had her first application of EpiFix last week. 4/9; patient presents for follow-up. We have been applying EpiFix under the total contact cast for the past 2 weeks. She is tolerated this well. She has no issues or complaints today. 4/16; patient presents for follow-up. We have been using EpiFix under the total contact cast. She has done well with this. Wound is smaller. She does have slough buildup. 4/23; patient presents for follow-up. We have been using EpiFix under the total contact cast. Wound appears well-healing. She does have slough buildup. 4/30; patient presents for follow-up. We have been using EpiFix under the total contact cast. Wound is smaller. She again has slough buildup. 5/6; patient with a left heel ulcer type II diabetic. We applied EpiFix #7 under a total contact cast. Her wound is making good progress. 5/14; patient presents for follow-up. We have been using EpiFix under the total contact casting wound is smaller. 5/21; patient presents for follow-up. We have been using EpiFix under the total contact cast. The wound is smaller. She has no issues or  complaints today. 5/28; patient presents for follow-up. We have been using EpiFix under the total contact cast. Wound is smaller. Klingler, Naveh C ([id number]) [id number] Page 3 of 9 6/11; patient presents for follow-up. We have been using EpiFix under the total contact cast. Wound is stable. 6/17; patient presents for follow-up. We have been using EpiFix under the total contact cast. Wound is smaller. 6/25; patient presents for follow-up. We have been using EpiFix under the total contact  cast. Wound continues to improve in size and is much smaller. 7/1; patient presents for follow-up. We have been using EpiFix under the total contact cast. Wound is almost healed. 7/9; patient presents for follow-up. We have been using EpiFix under the total contact cast. Wound is still present but very small. 7/15; patient presents for follow-up. We have been using EpiFix under the total contact cast. Wound is almost healed. 7/23; patient presents for follow-up. We use EpiFix under the total contact cast at last clinic visit. The wound has healed. 7/30; patient presents for follow-up. We Xeroform under the total contact cast at last clinic visit. The wound has remained healed. Electronic Signature(s) Signed: 05/04/2023 12:36:28 PM By: Geralyn Corwin DO Entered By: Geralyn Corwin on 05/04/2023 09:32:19 -------------------------------------------------------------------------------- Physical Exam Details Patient Name: Date of Service: [name] Bushy C. 05/04/2023 8:45 A M Medical Record Number: [id number] Patient Account Number: [id number] Date of Birth/Sex: Treating RN: 07-14-1961 (62 y.o. F) Primary Care Provider: [name] Other Clinician: Referring Provider: Treating Provider/Extender: [name] in Treatment: 60 Constitutional respirations regular, non-labored and within target range for patient.. Cardiovascular 2+ dorsalis pedis/posterior tibialis pulses. Psychiatric pleasant and cooperative. Notes Epithelization to the previous wound site. No signs of infection. Electronic Signature(s) Signed: 05/04/2023 12:36:28 PM By: Geralyn Corwin DO Entered By: Geralyn Corwin on 05/04/2023 09:32:43 -------------------------------------------------------------------------------- Physician Orders Details Patient Name: Date of Service: [name] Bushy C. 05/04/2023 8:45 A M Medical Record Number: [id number] Patient Account Number: [id number] Date of  Birth/Sex: Treating RN: 1960-11-20 (62 y.o. [name] Primary Care Provider: [name] Other Clinician: Referring Provider: Treating Provider/Extender: [name] in Treatment: 36 Verbal / Phone Orders: No Diagnosis Coding ICD-10 Coding Code Description Cooley, Jaxson C ([id number]) [phone] Page 4 of 9 [phone] Non-pressure chronic ulcer of other part of left foot with fat layer exposed E11.621 Type 2 diabetes mellitus with foot ulcer E66.01 Morbid (severe) obesity due to excess calories Discharge From [facility] Discharge from [facility] - Call if you ever need wound care in the future. No restrictions with physical therapy. Start in 2 weeks. Please ensure to keep the pressure off the back of the heel when sitting. while in bed use the prevalon boot. Electronic Signature(s) Signed: 05/04/2023 12:36:28 PM By: Geralyn Corwin DO Entered By: Geralyn Corwin on 05/04/2023 09:33:08 -------------------------------------------------------------------------------- Problem List Details Patient Name: Date of Service: [name] Bushy C. 05/04/2023 8:45 A M Medical Record Number: [id number] Patient Account Number: [id number] Date of Birth/Sex: Treating RN: 1961/04/25 (62 y.o. [name], [name] Primary Care Provider: [name] Other Clinician: Referring Provider: Treating Provider/Extender: [name] in Treatment: 60 Active Problems ICD-10 Encounter Code Description Active Date MDM Diagnosis [phone] Non-pressure chronic ulcer of other part of left foot with fat layer exposed 03/05/2022 No Yes E11.621 Type 2 diabetes mellitus with foot ulcer 03/05/2022 No Yes E66.01 Morbid (severe) obesity due to excess calories 03/05/2022 No Yes Inactive Problems Resolved Problems Electronic Signature(s) Signed: 05/04/2023 12:36:28  PM By: Geralyn Corwin DO Entered By: Geralyn Corwin on  05/04/2023 09:31:39 -------------------------------------------------------------------------------- Progress Note Details Patient Name: Date of Service: [name] 05/04/2023 8:45 A M Medical Record Number: [id number] Patient Account Number: [id number] Date of Birth/Sex: Treating RN: 1961-09-13 (62 y.o. F) Primary Care Provider: [name] Other Clinician: Referring Provider: Treating Provider/Extender: [name] in Treatment: [address], [place] C ([id number]) [id number] Page 5 of 9 Subjective Chief Complaint Information obtained from Patient 03/05/2022; left foot wound History of Present Illness (HPI) Admission 03/05/2022 [name] is a 62 year old female with a past medical history of uncontrolled insulin-dependent type 2 diabetes, tobacco user and chronic diastolic heart failure that presents to the clinic for a 10-month history of wound to her left heel. She states she had a left ankle fusion in September 2022. She states that she always had a wound after the surgery and it never healed. She has home health and they have been doing compression wraps along with silver alginate to the wound bed. She currently denies signs of infection. 6/8; this is a 62 year old woman with type 2 diabetes. She developed a wound on her left Achilles heel just above the tip of the heel in the setting of recurrent ankle surgeries in late 2022. I have seen some of these results from either the cast or the surgical boots that are put on after these operations. She thinks this may be the case. And a sense of pressure ulcer. In any case that she has been using Santyl Hydrofera Blue under compression. She is not wearing any footwear at home as she cannot find anything to accommodate the wrap 6/22; patient presents for follow-up. She has home health that comes out once a week to help with dressing changes. She has no issues or complaints  today. We have been using Hydrofera Blue under compression therapy. 7/6; patient presents for follow-up. She states that home health did not come out for the past 2 weeks. It is unclear why. We have been using Hydrofera Blue and Santyl under compression therapy. 7/13; patient presents for follow-up. She did not take the oral antibiotics prescribed at last clinic visit. We have been using Hydrofera Blue with gentamicin/mupirocin ointment under compression therapy. She has no issues or complaints today. She denies signs of infection. 7/20; patient presents for follow-up. We have been using Hydrofera Blue with antibiotic ointment under 3 layer compression. She has home health that change the dressing once. They put collagen on the wound bed. She also reports falling yesterday and hitting her right foot. She has no pain to the area today. 7/27; patient presents for follow-up. We have been using Hydrofera Blue under 3 layer compression. She has home health that changes the dressing. She has no issues or complaints today. 8/3; patient presents for follow-up. We continues to use Hydrofera Blue under 3 layer compression. She has no issues or complaints today. She has been approved for Epicord at 100%. 8/11; patient presents for follow-up. We have been using Hydrofera Blue under 3 layer compression. She has been approved for Epicord and we have this today. She is in agreement with having this applied. 8/18; patient presents for follow-up. Epicord #1 was placed in standard fashion last clinic visit. She has no issues or complaints today. 9/18; Unfortunately patient has missed her last clinic appointments due to falling and breaking her right ankle. We have been following her for her left ankle wound. She has also developed a large blister  to the left heel over the past week that has ruptured and dried. She currently resides In [facility]. Wound9/25; patient presents for follow-up. We have been using  Hydrofera Blue and Santyl to the original left ankle wound and Xeroform to the dried blistered. We have been wrapping her with compression therapy. She resides in a facility and they changed the wrap once this past week. She has no issues or complaints today. She denies signs of infection. 10/3; patient presents for follow-up. We have been using Xeroform to the heel wound and Epicort to the left ankle wound All under compression therapy. She has no issues or complaints today. 10/10; patient presents for follow-up. We have been using Epicort to the left ankle wound and Santyl and Hydrofera Blue to the heel wound. All under compression therapy. she has no issues or complaints today. 10/16; patient presents for follow-up. Epicort was placed in standard fashion to the superior wound and onto the heel and Santyl and Hydrofera Blue. She states that the wrap got wet during her shower and her facility was able to rewrap with Kerlix/Coban. 10/23; patient presents for follow-up. Epicord was placed to the wound beds last clinic visit. We continue Kerlix/Coban. She has no issues or complaints today. 10/31; Patient presents for follow-up. Epicord was placed to the wound beds last clinic visit. The original wound is almost healed. We have done this under Kerlix/Coban. She denies signs of infection. 11/10; patient presents for follow-up. Patient's last epi cord was placed in standard fashion at last clinic visit. The original wound has healed. She still has a heel wound. Grafix was approved however too costly for the patient. We will rerun epi cord to see if she can have more applications. She had done very well with this. We have been using Hydrofera Blue to the heel under Kerlix/Coban. 11/21; patient presents for follow-up. We have been using Hydrofera Blue and Santyl to the heel under Kerlix/Coban. She switched to a new healthcare insurance and again The skin substitute is too costly for the patient. She denies  signs of infection. 11/28; patient presents for follow-up. Her facility did not change the wrap last clinic visit due to lack of staffing. The wrap was taken off and Hydrofera Blue dressing has been in place for the past week. She has no issues or complaints today. 12/5; patient presents for follow-up. She states she has had more drainage over the past week. They did not change the wrap at her facility. She states the nurse will be back this week. She is also been doing a lot of physical therapy. She has been using the Prevalon boot while in bed. 12/12; patient presents for follow-up. We have been using Hydrofera Blue with antibiotic ointment under 4-layer compression. She is tolerated the wrap well. She states she is using her Prevalon boot while in the wheelchair. She has no issues or complaints today. There is been improvement in wound healing. 12/19; patient presents for follow-up. We have been using Hydrofera Blue and antibiotic ointment to the wound bed under 4-layer compression. Her facility change the wrap once. She states she has been using the Prevalon boot in bed but not in the wheelchair due to issues with transferring. Overall there is still improvement in wound healing. 1/16; patient presents for follow-up. She missed her last clinic appointment. We have been using Hydrofera Blue and antibiotic ointment under 4-layer compression. At the facility this has only been changed twice over the past 3 weeks. They are not  using 4-layer compression. She came in with Kerlix/Coban. 1/23; patient presents for follow-up. We have been using Santyl and Hydrofera Blue under 4-layer compression. Patient has no issues or complaints today. 2/1; patient presents for follow-up. Patient's been using Santyl and Hydrofera Blue to the wound bed. She has developed more slough and now a mild odor. She states she is wearing the Prevalon boot at night. It is unclear if she is offloading this area during the  day. Cawthorn, Nyree C ([id number]) [id number] Page 6 of 9 2/13; patient presents for follow-up. Patient's been using Dakin's wet-to-dry dressings. She received her Keystone antibiotic ointment in the mail. She has not used this yet. She reports using her Prevalon boot. She has no issues or complaints today. 2/20; patient presents for follow-up. She has been using Keystone antibiotic ointment with Hydrofera Blue. She has no issues or complaints today. Due to transportation she cannot do the total contact cast today. She is scheduled for next week to have this placed. 2/27; patient presents for follow-up. Plan is for the total contact cast today. We have been using Keystone and Hydrofera Blue to the wound bed. She forgot her Keystone antibiotic ointment. She has no issues or complaints today. 2/29; patient presents for follow-up. Patient presents for her obligatory cast change. She has no issues or complaints today. 3/5; patient presents for follow-up. She has had the cast in place for the past week and has tolerated this well. She has no issues or complaints today. We have been using PolyMem with Keystone antibiotic ointment. 12/15/2022: The wound is quite clean and measured smaller today. 3/19; we have been using Keystone antibiotic ointment with PolyMem silver under the total contact cast. Overall there is been improvement in wound healing. 3/26; patient presents for follow-up. She is been approved for epi fix. She would like to proceed with this today. Previously we have been using Keystone antibiotic ointment with PolyMem silver under the total contact cast. She has no issues or complaints. 01/05/2023: The wound is clean and she is tolerating total contact casting without difficulty. She had her first application of EpiFix last week. 4/9; patient presents for follow-up. We have been applying EpiFix under the total contact cast for the past 2 weeks. She is tolerated this  well. She has no issues or complaints today. 4/16; patient presents for follow-up. We have been using EpiFix under the total contact cast. She has done well with this. Wound is smaller. She does have slough buildup. 4/23; patient presents for follow-up. We have been using EpiFix under the total contact cast. Wound appears well-healing. She does have slough buildup. 4/30; patient presents for follow-up. We have been using EpiFix under the total contact cast. Wound is smaller. She again has slough buildup. 5/6; patient with a left heel ulcer type II diabetic. We applied EpiFix #7 under a total contact cast. Her wound is making good progress. 5/14; patient presents for follow-up. We have been using EpiFix under the total contact casting wound is smaller. 5/21; patient presents for follow-up. We have been using EpiFix under the total contact cast. The wound is smaller. She has no issues or complaints today. 5/28; patient presents for follow-up. We have been using EpiFix under the total contact cast. Wound is smaller. 6/11; patient presents for follow-up. We have been using EpiFix under the total contact cast. Wound is stable. 6/17; patient presents for follow-up. We have been using EpiFix under the total contact cast. Wound is smaller. 6/25; patient presents for follow-up.  We have been using EpiFix under the total contact cast. Wound continues to improve in size and is much smaller. 7/1; patient presents for follow-up. We have been using EpiFix under the total contact cast. Wound is almost healed. 7/9; patient presents for follow-up. We have been using EpiFix under the total contact cast. Wound is still present but very small. 7/15; patient presents for follow-up. We have been using EpiFix under the total contact cast. Wound is almost healed. 7/23; patient presents for follow-up. We use EpiFix under the total contact cast at last clinic visit. The wound has healed. 7/30; patient presents for follow-up.  We Xeroform under the total contact cast at last clinic visit. The wound has remained healed. Patient History Information obtained from Patient, Chart. Family History Cancer - Father,Mother, Diabetes - Mother,Maternal Grandparents,Paternal Grandparents, Heart Disease - Siblings,Maternal Grandparents, Hypertension - Siblings, Tuberculosis - Maternal Grandparents,Paternal Grandparents. Social History Current every day smoker, Marital Status - Single, Alcohol Use - Never, Drug Use - No History, Caffeine Use - Daily. 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 Vitals Time Taken: 9:03 AM, Height: 66 in, Weight: 339 lbs, BMI: 54.7, Temperature: 98.3 F, Pulse: 103 bpm, Respiratory Rate: 18 breaths/min, Blood Pressure: 122/80 mmHg, Capillary Blood Glucose: 109 mg/dl. Cardiovascular 2+ dorsalis pedis/posterior tibialis pulses. Psychiatric pleasant and cooperative. General Notes: Epithelization to the previous wound site. No signs of infection. Integumentary (Hair, Skin) Wound #2 status is Open. Original cause of wound was Blister. The date acquired was: 06/15/2022. The wound has been in treatment 44 weeks. The wound is located on the Left,Distal Calcaneus. The wound measures 0cm length x 0cm  width x 0cm depth; 0cm^2 area and 0cm^3 volume. There is a none present amount of drainage noted. The wound margin is distinct with the outline attached to the wound base. There is no granulation within the wound bed. There is no necrotic tissue within the wound bed. The periwound skin appearance had no abnormalities noted for texture. The periwound skin appearance had no abnormalities noted for color. The periwound skin appearance did not exhibit: Dry/Scaly, Maceration. Periwound temperature was noted as No Abnormality. Assessment Active Problems ICD-10 Non-pressure chronic ulcer of other part of left foot with fat layer exposed Type 2 diabetes mellitus with foot ulcer Morbid (severe) obesity due to excess calories Patient's wound has remained healed. At this time I recommended continuing to aggressively offload the area with her Prevalon boot when sitting for long periods of time or in the bed For the next month. She knows to not ambulate with this. She can restart her physical therapy in 2 weeks. She knows to call with any questions or concerns. Follow-up as needed. Plan Discharge From [facility]: Discharge from Wound Care Center - Call if you ever need wound care in the future. No restrictions with physical therapy. Start in 2 weeks. Please ensure to keep the pressure off the back of the heel when sitting. while in bed use the prevalon boot. 1. Continue aggressive  offloading 2. Follow-up as needed 3. Discharge from clinic due to closed wound. Electronic Signature(s) Signed: 05/04/2023 12:36:28 PM By: Geralyn Corwin DO Entered By: Geralyn Corwin on 05/04/2023 09:34:57 -------------------------------------------------------------------------------- HxROS Details Patient Name: Date of Service: [name] Bushy C. 05/04/2023 8:45 A M Medical Record Number: [id number] Patient Account Number: [id number] Date of Birth/Sex: Treating RN: 05-Oct-1961 (62 y.o. F) Esquibel, Mylinh C ([id number])  [id number] Page 8 of 9 Primary Care Provider: [name] Other Clinician: Referring Provider: Treating Provider/Extender: [name] in Treatment: 60 Information Obtained From Patient Chart Hematologic/Lymphatic Medical History: Positive for: Anemia Respiratory Medical History: Positive for: Chronic Obstructive Pulmonary Disease (COPD) Cardiovascular Medical History: Positive for: Congestive Heart Failure - Weighs self daily; Hypertension; Myocardial Infarction; Peripheral Venous Disease Gastrointestinal Medical History: Past Medical History Notes: GERD, Peptic Ulcer Disease Endocrine Medical History: Positive for: Type II Diabetes Time with diabetes: 16 years Treated with: Insulin, Oral agents Blood sugar tested every day: Yes Tested : Twice daily Musculoskeletal Medical History: Past Medical History Notes: Broke left leg 3 years ago, 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, foot fused Neurologic Medical History: Positive for: Neuropathy Psychiatric Medical History: Past Medical History Notes: Depression Immunizations Pneumococcal Vaccine: Received Pneumococcal Vaccination: No Implantable Devices None Family and Social History Cancer: Yes - Father,Mother; Diabetes: Yes - Mother,Maternal Grandparents,Paternal Grandparents; Heart Disease: Yes - Siblings,Maternal Grandparents; Hypertension: Yes - Siblings; Tuberculosis: Yes - Maternal Grandparents,Paternal Grandparents; Current every day smoker; Marital Status - Single; Alcohol Use: Never; Drug Use: No History; Caffeine Use: Daily; Financial Concerns: No; Food, Clothing or Shelter Needs: No; Support System Lacking: No; Transportation Concerns: No Electronic Signature(s) Signed: 05/04/2023 12:36:28 PM By: Geralyn Corwin DO Entered By: Geralyn Corwin on 05/04/2023 09:32:24 Vadala, Mailin C ([id number]) [id number] Page 9 of  9 -------------------------------------------------------------------------------- SuperBill Details Patient Name: Date of Service: [name] 05/04/2023 Medical Record Number: [id number] Patient Account Number: [id number] Date of Birth/Sex: Treating RN: [birth year] (62 y.o. [name], [name] Primary Care Provider: [name] Other Clinician: Referring Provider: Treating Provider/Extender: [name] in Treatment: 60 Diagnosis Coding ICD-10 Codes Code Description [phone] Non-pressure chronic ulcer of other part of left foot with fat layer exposed E11.621 Type 2 diabetes mellitus with foot ulcer E66.01 Morbid (severe) obesity due to excess calories Facility Procedures : CPT4 Code: [id number] Description: 99213 - WOUND CARE VISIT-LEV 3 EST PT Modifier: Quantity: 1 Physician Procedures : CPT4 Code Description Modifier [id number] 99213 - WC PHYS LEVEL 3 - EST PT ICD-10 Diagnosis Description L97.522 Non-pressure chronic ulcer of other part of left foot with fat layer exposed E11.621 Type 2 diabetes mellitus with foot ulcer E66.01 Morbid  (severe) obesity due to excess calories Quantity: 1 Electronic Signature(s) Signed: 05/04/2023 12:36:28 PM By: Geralyn Corwin DO Entered By: Geralyn Corwin on 05/04/2023 09:35:14

## 2023-05-05 NOTE — Progress Notes (Signed)
Quade, Fusae [name] ([id number]) [id number] Page 1 of 8 Visit Report for 05/04/2023 Arrival Information Details Patient Name: Date of Service: [name] 05/04/2023 8:45 A M Medical Record Number: [id number] Patient Account Number: [id number] Date of Birth/Sex: Treating RN: [birth year] (62 y.o. F) Primary Care Idara Woodside: [name] Other Clinician: Referring Ameira Alessandrini: Treating Chancellor Vanderloop/Extender: [name] in Treatment: 60 Visit Information History Since Last Visit Added or deleted any medications: No Patient Arrived: Wheel Chair Any new allergies or adverse reactions: No Arrival Time: 08:50 Had a fall or experienced change in No Accompanied By: caregiver activities of daily living that may affect Transfer Assistance: None risk of falls: Patient Identification Verified: Yes Signs or symptoms of abuse/neglect since last No Secondary Verification Process Completed: Yes visito Patient Requires Transmission-Based Precautions: No Hospitalized since last visit: No Patient Has Alerts: No Implantable device outside of the clinic No excluding cellular tissue based products placed in the center since last visit: Has Dressing in Place as Prescribed: Yes Has Footwear/Offloading in Place as Yes Prescribed: Left: Removable Cast Walker/Walking Boot T Contact Cast otal Pain Present Now: No Electronic Signature(s) Signed: 05/04/2023 4:56:42 PM By: Thayer Dallas Entered By: Thayer Dallas on 05/04/2023 09:03:54 -------------------------------------------------------------------------------- Clinic Level of Care Assessment Details Patient Name: Date of Service: [name] Bushy [name]. 05/04/2023 8:45 A M Medical Record Number: [id number] Patient Account Number: [id number] Date of Birth/Sex: Treating RN: [birth year] (62 y.o. [name], [name] Primary Care Eesha Schmaltz: [name] Other Clinician: Referring Tandy Grawe: Treating  Kabrea Seeney/Extender: [name] in Treatment: 60 Clinic Level of Care Assessment Items TOOL 4 Quantity Score X- 1 0 Use when only an EandM is performed on FOLLOW-UP visit ASSESSMENTS - Nursing Assessment / Reassessment X- 1 10 Reassessment of Co-morbidities (includes updates in patient status) X- 1 5 Reassessment of Adherence to Treatment Plan ASSESSMENTS - Wound and Skin A ssessment / Reassessment X - Simple Wound Assessment / Reassessment - one wound 1 5 []  - 0 Complex Wound Assessment / Reassessment - multiple wounds X- 1 10 Dermatologic / Skin Assessment (not related to wound area) Cobin, Ame [name] ([id number]) [id number] Page 2 of 8 ASSESSMENTS - Focused Assessment X- 1 5 Circumferential Edema Measurements - multi extremities []  - 0 Nutritional Assessment / Counseling / Intervention []  - 0 Lower Extremity Assessment (monofilament, tuning fork, pulses) []  - 0 Peripheral Arterial Disease Assessment (using hand held doppler) ASSESSMENTS - Ostomy and/or Continence Assessment and Care []  - 0 Incontinence Assessment and Management []  - 0 Ostomy Care Assessment and Management (repouching, etc.) PROCESS - Coordination of Care X - Simple Patient / Family Education for ongoing care 1 15 []  - 0 Complex (extensive) Patient / Family Education for ongoing care X- 1 10 Staff obtains Chiropractor, Records, T Results / Process Orders est X- 1 10 Staff telephones HHA, Nursing Homes / Clarify orders / etc []  - 0 Routine Transfer to another Facility (non-emergent condition) []  - 0 Routine Hospital Admission (non-emergent condition) []  - 0 New Admissions / Manufacturing engineer / Ordering NPWT Apligraf, etc. , []  - 0 Emergency Hospital Admission (emergent condition) X- 1 10 Simple Discharge Coordination []  - 0 Complex (extensive) Discharge Coordination PROCESS - Special Needs []  - 0 Pediatric / Minor Patient Management []  -  0 Isolation Patient Management []  - 0 Hearing / Language / Visual special needs []  - 0 Assessment of Community assistance (transportation, D/[name] planning, etc.) []  - 0 Additional assistance / Altered mentation []  - 0 Support  Surface(s) Assessment (bed, cushion, seat, etc.) INTERVENTIONS - Wound Cleansing / Measurement X - Simple Wound Cleansing - one wound 1 5 []  - 0 Complex Wound Cleansing - multiple wounds X- 1 5 Wound Imaging (photographs - any number of wounds) []  - 0 Wound Tracing (instead of photographs) X- 1 5 Simple Wound Measurement - one wound []  - 0 Complex Wound Measurement - multiple wounds INTERVENTIONS - Wound Dressings []  - 0 Small Wound Dressing one or multiple wounds []  - 0 Medium Wound Dressing one or multiple wounds []  - 0 Large Wound Dressing one or multiple wounds []  - 0 Application of Medications - topical []  - 0 Application of Medications - injection INTERVENTIONS - Miscellaneous []  - 0 External ear exam []  - 0 Specimen Collection (cultures, biopsies, blood, body fluids, etc.) []  - 0 Specimen(s) / Culture(s) sent or taken to Lab for analysis []  - 0 Patient Transfer (multiple staff / Michiel Sites Lift / Similar devices) Neumann, Kahla [name] ([id number]) [id number] Page 3 of 8 []  - 0 Simple Staple / Suture removal (25 or less) []  - 0 Complex Staple / Suture removal (26 or more) []  - 0 Hypo / Hyperglycemic Management (close monitor of Blood Glucose) []  - 0 Ankle / Brachial Index (ABI) - do not check if billed separately X- 1 5 Vital Signs Has the patient been seen at the hospital within the last three years: Yes Total Score: 100 Level Of Care: New/Established - Level 3 Electronic Signature(s) Signed: 05/04/2023 6:21:10 PM By: Shawn Stall RN, BSN Entered By: Shawn Stall on 05/04/2023 09:31:30 -------------------------------------------------------------------------------- Encounter Discharge Information Details Patient  Name: Date of Service: [name] Bushy [name]. 05/04/2023 8:45 A M Medical Record Number: [id number] Patient Account Number: [id number] Date of Birth/Sex: Treating RN: 1961/07/14 (61 y.o. [name], [name] Primary Care Abdulkadir Emmanuel: [name] Other Clinician: Referring Dawn Convery: Treating Axtyn Woehler/Extender: [name] in Treatment: 60 Encounter Discharge Information Items Discharge Condition: Stable Ambulatory Status: Wheelchair Discharge Destination: Home Transportation: Private Auto Accompanied By: caregiver Schedule Follow-up Appointment: No Clinical Summary of Care: Electronic Signature(s) Signed: 05/04/2023 6:21:10 PM By: Shawn Stall RN, BSN Entered By: Shawn Stall on 05/04/2023 09:31:55 -------------------------------------------------------------------------------- Lower Extremity Assessment Details Patient Name: Date of Service: [name] Bushy [name]. 05/04/2023 8:45 A M Medical Record Number: [id number] Patient Account Number: [id number] Date of Birth/Sex: Treating RN: [birth year] (62 y.o. F) Primary Care Leanard Dimaio: [name] Other Clinician: Referring Radley Barto: Treating Ithan Touhey/Extender: [name] in Treatment: 60 Edema Assessment Assessed: [name]: No] [name]: No] Edema: [Left: Ye] [Right: s] Calf Left: Right: Point of Measurement: 28 cm From Medial Instep 48.5 cm Ankle Deeney, [name] ([id number]) [id number] Page 4 of 8 Left: Right: Point of Measurement: 3 cm From Medial Instep 32 cm Vascular Assessment Extremity colors, hair growth, and conditions: Extremity Color: [Left:Normal] Hair Growth on Extremity: [Left:No] Capillary Refill: [Left:< 3 seconds] Dependent Rubor: [Left:No No] Electronic Signature(s) Signed: 05/04/2023 4:56:42 PM By: Thayer Dallas Entered By: Thayer Dallas on 05/04/2023  09:07:20 -------------------------------------------------------------------------------- Multi Wound Chart Details Patient Name: Date of Service: [name] Bushy [name]. 05/04/2023 8:45 A M Medical Record Number: [id number] Patient Account Number: [id number] Date of Birth/Sex: Treating RN: 30-Nov-1960 (62 y.o. F) Primary Care Zakeria Kulzer: [name] Other Clinician: Referring Emmory Solivan: Treating Nels Munn/Extender: [name] in Treatment: 60 Vital Signs Height(in): 66 Capillary Blood Glucose(mg/dl): 220 Weight(lbs): 254 Pulse(bpm): 103 Body Mass Index(BMI): 54.7 Blood Pressure(mmHg): 122/80 Temperature(F): 98.3 Respiratory Rate(breaths/min): 18 [2:Photos:] [N/A:N/A] Left, Distal Calcaneus  N/A N/A Wound Location: Blister N/A N/A Wounding Event: Diabetic Wound/Ulcer of the Lower N/A N/A Primary Etiology: Extremity Anemia, Chronic Obstructive N/A N/A Comorbid History: Pulmonary Disease (COPD), Congestive Heart Failure, Hypertension, Myocardial Infarction, Peripheral Venous Disease, Type II Diabetes, Neuropathy 06/15/2022 N/A N/A Date Acquired: 51 N/A N/A Weeks of Treatment: Open N/A N/A Wound Status: No N/A N/A Wound Recurrence: 0x0x0 N/A N/A Measurements L x W x D (cm) 0 N/A N/A A (cm) : rea 0 N/A N/A Volume (cm) : 100.00% N/A N/A % Reduction in A rea: 100.00% N/A N/A % Reduction in Volume: Grade 2 N/A N/A Classification: None Present N/A N/A Exudate A mount: Distinct, outline attached N/A N/A Wound Margin: None Present (0%) N/A N/A Granulation A mount: None Present (0%) N/A N/A Necrotic A mount: Fascia: No N/A N/A Exposed Structures: [name], [name] ([id number]) [id number] Page 5 of 8 Fat Layer (Subcutaneous Tissue): No Tendon: No Muscle: No Joint: No Bone: No Large (67-100%) N/A N/A Epithelialization: No Abnormalities Noted N/A N/A Periwound Skin Texture: Maceration: No N/A N/A Periwound  Skin Moisture: Dry/Scaly: No No Abnormalities Noted N/A N/A Periwound Skin Color: No Abnormality N/A N/A Temperature: Treatment Notes Electronic Signature(s) Signed: 05/04/2023 12:36:28 PM By: Geralyn Corwin DO Entered By: Geralyn Corwin on 05/04/2023 09:31:44 -------------------------------------------------------------------------------- Multi-Disciplinary Care Plan Details Patient Name: Date of Service: [name] Bushy [name]. 05/04/2023 8:45 A M Medical Record Number: [id number] Patient Account Number: [id number] Date of Birth/Sex: Treating RN: 03-03-1961 (62 y.o. [name] Primary Care Latrenda Irani: [name] Other Clinician: Referring Laruen Risser: Treating Idelle Reimann/Extender: [name] in Treatment: 60 Active Inactive Electronic Signature(s) Signed: 05/04/2023 6:21:10 PM By: Shawn Stall RN, BSN Entered By: Shawn Stall on 05/04/2023 09:29:41 -------------------------------------------------------------------------------- Pain Assessment Details Patient Name: Date of Service: [name] Bushy [name]. 05/04/2023 8:45 A M Medical Record Number: [id number] Patient Account Number: [id number] Date of Birth/Sex: Treating RN: 1960/11/08 (62 y.o. F) Primary Care Cloud Graham: [name] Other Clinician: Referring Raynald Rouillard: Treating Patrisha Hausmann/Extender: [name] in Treatment: 60 Active Problems Location of Pain Severity and Description of Pain Patient Has Paino No Site Locations [name], [name] ([id number]) [id number].pdf Page 6 of 8 Pain Management and Medication Current Pain Management: Electronic Signature(s) Signed: 05/04/2023 4:56:42 PM By: Thayer Dallas Entered By: Thayer Dallas on 05/04/2023 09:06:10 -------------------------------------------------------------------------------- Patient/Caregiver Education Details Patient Name: Date of Service: [name] 7/30/2024andnbsp8:45  A M Medical Record Number: [id number] Patient Account Number: [id number] Date of Birth/Gender: Treating RN: 1960/12/21 (62 y.o. [name], [name] Primary Care Physician: [name] Other Clinician: Referring Physician: Treating Physician/Extender: [name] in Treatment: 60 Education Assessment Education Provided To: Patient Education Topics Provided Wound/Skin Impairment: Handouts: Caring for Your Ulcer Methods: Explain/Verbal Responses: Reinforcements needed Electronic Signature(s) Signed: 05/04/2023 6:21:10 PM By: Shawn Stall RN, BSN Entered By: Shawn Stall on 05/04/2023 09:19:47 -------------------------------------------------------------------------------- Wound Assessment Details Patient Name: Date of Service: [name] Bushy [name]. 05/04/2023 8:45 A M Medical Record Number: [id number] Patient Account Number: [id number] Date of Birth/Sex: Treating RN: [birth year] (62 y.o. F) Primary Care Armany Mano: [name] Other Clinician: Lou [name], [name] ([id number]) [id number] Page 7 of 8 Referring Holy Battenfield: Treating Iasiah Ozment/Extender: [name] in Treatment: 60 Wound Status Wound Number: 2 Primary Diabetic Wound/Ulcer of the Lower Extremity Etiology: Wound Location: Left, Distal Calcaneus Wound Open Wounding Event: Blister Status: Date Acquired: 06/15/2022 Comorbid Anemia, Chronic Obstructive Pulmonary Disease (COPD), Weeks Of Treatment: 44 History: Congestive Heart Failure, Hypertension, Myocardial  Infarction, Clustered Wound: No Peripheral Venous Disease, Type II Diabetes, Neuropathy Photos Wound Measurements Length: (cm) Width: (cm) Depth: (cm) Area: (cm) Volume: (cm) 0 % Reduction in Area: 100% 0 % Reduction in Volume: 100% 0 Epithelialization: Large (67-100%) 0 0 Wound Description Classification: Grade 2 Wound Margin: Distinct, outline attached Exudate Amount: None  Present Foul Odor After Cleansing: No Slough/Fibrino No Wound Bed Granulation Amount: None Present (0%) Exposed Structure Necrotic Amount: None Present (0%) Fascia Exposed: No Fat Layer (Subcutaneous Tissue) Exposed: No Tendon Exposed: No Muscle Exposed: No Joint Exposed: No Bone Exposed: No Periwound Skin Texture Texture Color No Abnormalities Noted: Yes No Abnormalities Noted: Yes Moisture Temperature / Pain No Abnormalities Noted: No Temperature: No Abnormality Dry / Scaly: No Maceration: No Electronic Signature(s) Signed: 05/04/2023 4:56:42 PM By: Thayer Dallas Entered By: Thayer Dallas on 05/04/2023 09:11:57 -------------------------------------------------------------------------------- Vitals Details Patient Name: Date of Service: [name] Bushy [name]. 05/04/2023 8:45 A M Medical Record Number: [id number] Patient Account Number: [id number] Date of Birth/Sex: Treating RN: May 20, 1961 (62 y.o. F) Primary Care Syrah Daughtrey: [name] Other Clinician: Lou [name], [name] ([id number]) [id number] Page 8 of 8 Referring Zania Kalisz: Treating Lenice Koper/Extender: [name] in Treatment: 60 Vital Signs Time Taken: 09:03 Temperature (F): 98.3 Height (in): 66 Pulse (bpm): 103 Weight (lbs): 339 Respiratory Rate (breaths/min): 18 Body Mass Index (BMI): 54.7 Blood Pressure (mmHg): 122/80 Capillary Blood Glucose (mg/dl): 254 Reference Range: 80 - 120 mg / dl Electronic Signature(s) Signed: 05/04/2023 4:56:42 PM By: Thayer Dallas Entered By: Thayer Dallas on 05/04/2023 09:05:53

## 2023-05-28 ENCOUNTER — Other Ambulatory Visit: Payer: Self-pay | Admitting: Medical Genetics

## 2023-05-28 DIAGNOSIS — Z006 Encounter for examination for normal comparison and control in clinical research program: Secondary | ICD-10-CM

## 2023-06-14 ENCOUNTER — Encounter (INDEPENDENT_AMBULATORY_CARE_PROVIDER_SITE_OTHER): Payer: Self-pay

## 2023-11-11 ENCOUNTER — Encounter (HOSPITAL_BASED_OUTPATIENT_CLINIC_OR_DEPARTMENT_OTHER): Payer: 59 | Attending: Internal Medicine | Admitting: Internal Medicine

## 2023-11-11 DIAGNOSIS — L97522 Non-pressure chronic ulcer of other part of left foot with fat layer exposed: Secondary | ICD-10-CM | POA: Diagnosis not present

## 2023-11-11 DIAGNOSIS — E11621 Type 2 diabetes mellitus with foot ulcer: Secondary | ICD-10-CM | POA: Insufficient documentation

## 2023-11-16 ENCOUNTER — Encounter (HOSPITAL_BASED_OUTPATIENT_CLINIC_OR_DEPARTMENT_OTHER): Payer: 59 | Admitting: Internal Medicine

## 2023-11-16 DIAGNOSIS — E11621 Type 2 diabetes mellitus with foot ulcer: Secondary | ICD-10-CM

## 2023-11-16 DIAGNOSIS — L97522 Non-pressure chronic ulcer of other part of left foot with fat layer exposed: Secondary | ICD-10-CM

## 2023-11-23 ENCOUNTER — Ambulatory Visit (HOSPITAL_BASED_OUTPATIENT_CLINIC_OR_DEPARTMENT_OTHER): Payer: 59 | Admitting: Internal Medicine

## 2023-11-25 ENCOUNTER — Encounter (HOSPITAL_BASED_OUTPATIENT_CLINIC_OR_DEPARTMENT_OTHER): Payer: 59 | Admitting: Internal Medicine

## 2023-11-25 DIAGNOSIS — E11621 Type 2 diabetes mellitus with foot ulcer: Secondary | ICD-10-CM

## 2023-11-25 DIAGNOSIS — L97522 Non-pressure chronic ulcer of other part of left foot with fat layer exposed: Secondary | ICD-10-CM | POA: Diagnosis not present

## 2023-12-02 ENCOUNTER — Encounter (HOSPITAL_BASED_OUTPATIENT_CLINIC_OR_DEPARTMENT_OTHER): Payer: 59 | Admitting: Internal Medicine

## 2023-12-02 DIAGNOSIS — E11621 Type 2 diabetes mellitus with foot ulcer: Secondary | ICD-10-CM

## 2023-12-02 DIAGNOSIS — L97522 Non-pressure chronic ulcer of other part of left foot with fat layer exposed: Secondary | ICD-10-CM

## 2023-12-09 ENCOUNTER — Encounter (HOSPITAL_BASED_OUTPATIENT_CLINIC_OR_DEPARTMENT_OTHER): Payer: 59 | Attending: Internal Medicine | Admitting: Internal Medicine

## 2023-12-09 DIAGNOSIS — E11621 Type 2 diabetes mellitus with foot ulcer: Secondary | ICD-10-CM | POA: Diagnosis present

## 2023-12-09 DIAGNOSIS — L97522 Non-pressure chronic ulcer of other part of left foot with fat layer exposed: Secondary | ICD-10-CM | POA: Insufficient documentation

## 2023-12-16 ENCOUNTER — Encounter (HOSPITAL_BASED_OUTPATIENT_CLINIC_OR_DEPARTMENT_OTHER): Payer: 59 | Admitting: Internal Medicine

## 2023-12-16 DIAGNOSIS — L97522 Non-pressure chronic ulcer of other part of left foot with fat layer exposed: Secondary | ICD-10-CM | POA: Diagnosis not present

## 2023-12-16 DIAGNOSIS — E11621 Type 2 diabetes mellitus with foot ulcer: Secondary | ICD-10-CM

## 2023-12-23 ENCOUNTER — Ambulatory Visit (HOSPITAL_BASED_OUTPATIENT_CLINIC_OR_DEPARTMENT_OTHER): Payer: 59 | Admitting: Internal Medicine

## 2023-12-23 ENCOUNTER — Encounter (HOSPITAL_BASED_OUTPATIENT_CLINIC_OR_DEPARTMENT_OTHER): Payer: 59 | Admitting: Internal Medicine

## 2023-12-23 DIAGNOSIS — E11621 Type 2 diabetes mellitus with foot ulcer: Secondary | ICD-10-CM | POA: Diagnosis not present

## 2023-12-23 DIAGNOSIS — L97522 Non-pressure chronic ulcer of other part of left foot with fat layer exposed: Secondary | ICD-10-CM

## 2023-12-30 ENCOUNTER — Encounter (HOSPITAL_BASED_OUTPATIENT_CLINIC_OR_DEPARTMENT_OTHER): Payer: 59 | Admitting: Internal Medicine

## 2023-12-30 DIAGNOSIS — E11621 Type 2 diabetes mellitus with foot ulcer: Secondary | ICD-10-CM | POA: Diagnosis not present

## 2023-12-30 DIAGNOSIS — L97522 Non-pressure chronic ulcer of other part of left foot with fat layer exposed: Secondary | ICD-10-CM

## 2024-01-06 ENCOUNTER — Encounter (HOSPITAL_BASED_OUTPATIENT_CLINIC_OR_DEPARTMENT_OTHER): Attending: Internal Medicine | Admitting: Internal Medicine

## 2024-01-06 DIAGNOSIS — Z794 Long term (current) use of insulin: Secondary | ICD-10-CM | POA: Insufficient documentation

## 2024-01-06 DIAGNOSIS — E1165 Type 2 diabetes mellitus with hyperglycemia: Secondary | ICD-10-CM | POA: Insufficient documentation

## 2024-01-06 DIAGNOSIS — L97522 Non-pressure chronic ulcer of other part of left foot with fat layer exposed: Secondary | ICD-10-CM | POA: Diagnosis not present

## 2024-01-06 DIAGNOSIS — E11621 Type 2 diabetes mellitus with foot ulcer: Secondary | ICD-10-CM | POA: Diagnosis not present

## 2024-01-13 ENCOUNTER — Encounter (HOSPITAL_BASED_OUTPATIENT_CLINIC_OR_DEPARTMENT_OTHER): Admitting: Internal Medicine

## 2024-01-13 DIAGNOSIS — L97522 Non-pressure chronic ulcer of other part of left foot with fat layer exposed: Secondary | ICD-10-CM

## 2024-01-13 DIAGNOSIS — E11621 Type 2 diabetes mellitus with foot ulcer: Secondary | ICD-10-CM | POA: Diagnosis not present

## 2024-01-13 DIAGNOSIS — E1165 Type 2 diabetes mellitus with hyperglycemia: Secondary | ICD-10-CM | POA: Diagnosis not present

## 2024-01-13 DIAGNOSIS — Z794 Long term (current) use of insulin: Secondary | ICD-10-CM | POA: Diagnosis not present

## 2024-01-20 ENCOUNTER — Encounter (HOSPITAL_BASED_OUTPATIENT_CLINIC_OR_DEPARTMENT_OTHER): Admitting: Internal Medicine

## 2024-01-20 DIAGNOSIS — L97412 Non-pressure chronic ulcer of right heel and midfoot with fat layer exposed: Secondary | ICD-10-CM | POA: Diagnosis not present

## 2024-01-20 DIAGNOSIS — Z794 Long term (current) use of insulin: Secondary | ICD-10-CM | POA: Diagnosis not present

## 2024-01-20 DIAGNOSIS — L97522 Non-pressure chronic ulcer of other part of left foot with fat layer exposed: Secondary | ICD-10-CM | POA: Diagnosis not present

## 2024-01-20 DIAGNOSIS — E11621 Type 2 diabetes mellitus with foot ulcer: Secondary | ICD-10-CM | POA: Diagnosis not present

## 2024-01-20 DIAGNOSIS — E1165 Type 2 diabetes mellitus with hyperglycemia: Secondary | ICD-10-CM | POA: Diagnosis not present

## 2024-01-21 DIAGNOSIS — J449 Chronic obstructive pulmonary disease, unspecified: Secondary | ICD-10-CM | POA: Diagnosis not present

## 2024-01-25 DIAGNOSIS — E1140 Type 2 diabetes mellitus with diabetic neuropathy, unspecified: Secondary | ICD-10-CM | POA: Diagnosis not present

## 2024-01-25 DIAGNOSIS — G894 Chronic pain syndrome: Secondary | ICD-10-CM | POA: Diagnosis not present

## 2024-01-25 DIAGNOSIS — J449 Chronic obstructive pulmonary disease, unspecified: Secondary | ICD-10-CM | POA: Diagnosis not present

## 2024-01-25 DIAGNOSIS — K219 Gastro-esophageal reflux disease without esophagitis: Secondary | ICD-10-CM | POA: Diagnosis not present

## 2024-01-26 DIAGNOSIS — J449 Chronic obstructive pulmonary disease, unspecified: Secondary | ICD-10-CM | POA: Diagnosis not present

## 2024-01-26 DIAGNOSIS — K219 Gastro-esophageal reflux disease without esophagitis: Secondary | ICD-10-CM | POA: Diagnosis not present

## 2024-01-26 DIAGNOSIS — I10 Essential (primary) hypertension: Secondary | ICD-10-CM | POA: Diagnosis not present

## 2024-01-26 DIAGNOSIS — E1140 Type 2 diabetes mellitus with diabetic neuropathy, unspecified: Secondary | ICD-10-CM | POA: Diagnosis not present

## 2024-01-27 ENCOUNTER — Encounter (HOSPITAL_BASED_OUTPATIENT_CLINIC_OR_DEPARTMENT_OTHER): Admitting: Internal Medicine

## 2024-01-27 DIAGNOSIS — L97522 Non-pressure chronic ulcer of other part of left foot with fat layer exposed: Secondary | ICD-10-CM | POA: Diagnosis not present

## 2024-01-27 DIAGNOSIS — E11621 Type 2 diabetes mellitus with foot ulcer: Secondary | ICD-10-CM

## 2024-01-27 DIAGNOSIS — E1165 Type 2 diabetes mellitus with hyperglycemia: Secondary | ICD-10-CM | POA: Diagnosis not present

## 2024-01-27 DIAGNOSIS — Z794 Long term (current) use of insulin: Secondary | ICD-10-CM | POA: Diagnosis not present

## 2024-01-28 DIAGNOSIS — E1140 Type 2 diabetes mellitus with diabetic neuropathy, unspecified: Secondary | ICD-10-CM | POA: Diagnosis not present

## 2024-01-28 DIAGNOSIS — R799 Abnormal finding of blood chemistry, unspecified: Secondary | ICD-10-CM | POA: Diagnosis not present

## 2024-02-03 ENCOUNTER — Encounter (HOSPITAL_BASED_OUTPATIENT_CLINIC_OR_DEPARTMENT_OTHER): Attending: Internal Medicine | Admitting: Internal Medicine

## 2024-02-03 DIAGNOSIS — L97522 Non-pressure chronic ulcer of other part of left foot with fat layer exposed: Secondary | ICD-10-CM | POA: Diagnosis not present

## 2024-02-03 DIAGNOSIS — E11621 Type 2 diabetes mellitus with foot ulcer: Secondary | ICD-10-CM | POA: Insufficient documentation

## 2024-02-04 DIAGNOSIS — Z961 Presence of intraocular lens: Secondary | ICD-10-CM | POA: Diagnosis not present

## 2024-02-04 DIAGNOSIS — E1165 Type 2 diabetes mellitus with hyperglycemia: Secondary | ICD-10-CM | POA: Diagnosis not present

## 2024-02-04 DIAGNOSIS — H524 Presbyopia: Secondary | ICD-10-CM | POA: Diagnosis not present

## 2024-02-09 DIAGNOSIS — H524 Presbyopia: Secondary | ICD-10-CM | POA: Diagnosis not present

## 2024-02-10 ENCOUNTER — Encounter (HOSPITAL_BASED_OUTPATIENT_CLINIC_OR_DEPARTMENT_OTHER): Admitting: Internal Medicine

## 2024-02-10 DIAGNOSIS — L97522 Non-pressure chronic ulcer of other part of left foot with fat layer exposed: Secondary | ICD-10-CM | POA: Diagnosis not present

## 2024-02-10 DIAGNOSIS — E11621 Type 2 diabetes mellitus with foot ulcer: Secondary | ICD-10-CM | POA: Diagnosis not present

## 2024-02-11 DIAGNOSIS — M79642 Pain in left hand: Secondary | ICD-10-CM | POA: Diagnosis not present

## 2024-02-11 DIAGNOSIS — M79641 Pain in right hand: Secondary | ICD-10-CM | POA: Diagnosis not present

## 2024-02-11 DIAGNOSIS — B351 Tinea unguium: Secondary | ICD-10-CM | POA: Diagnosis not present

## 2024-02-11 DIAGNOSIS — E1159 Type 2 diabetes mellitus with other circulatory complications: Secondary | ICD-10-CM | POA: Diagnosis not present

## 2024-02-17 ENCOUNTER — Encounter (HOSPITAL_BASED_OUTPATIENT_CLINIC_OR_DEPARTMENT_OTHER): Admitting: Internal Medicine

## 2024-02-17 DIAGNOSIS — L97522 Non-pressure chronic ulcer of other part of left foot with fat layer exposed: Secondary | ICD-10-CM

## 2024-02-17 DIAGNOSIS — E11621 Type 2 diabetes mellitus with foot ulcer: Secondary | ICD-10-CM | POA: Diagnosis not present

## 2024-02-20 DIAGNOSIS — J449 Chronic obstructive pulmonary disease, unspecified: Secondary | ICD-10-CM | POA: Diagnosis not present

## 2024-02-23 DIAGNOSIS — I10 Essential (primary) hypertension: Secondary | ICD-10-CM | POA: Diagnosis not present

## 2024-02-23 DIAGNOSIS — J449 Chronic obstructive pulmonary disease, unspecified: Secondary | ICD-10-CM | POA: Diagnosis not present

## 2024-02-24 ENCOUNTER — Encounter (HOSPITAL_BASED_OUTPATIENT_CLINIC_OR_DEPARTMENT_OTHER): Admitting: Internal Medicine

## 2024-02-24 DIAGNOSIS — L97522 Non-pressure chronic ulcer of other part of left foot with fat layer exposed: Secondary | ICD-10-CM | POA: Diagnosis not present

## 2024-02-24 DIAGNOSIS — E11621 Type 2 diabetes mellitus with foot ulcer: Secondary | ICD-10-CM

## 2024-02-24 DIAGNOSIS — Z780 Asymptomatic menopausal state: Secondary | ICD-10-CM | POA: Diagnosis not present

## 2024-02-24 DIAGNOSIS — M8589 Other specified disorders of bone density and structure, multiple sites: Secondary | ICD-10-CM | POA: Diagnosis not present

## 2024-02-24 DIAGNOSIS — M85852 Other specified disorders of bone density and structure, left thigh: Secondary | ICD-10-CM | POA: Diagnosis not present

## 2024-02-24 DIAGNOSIS — Z13820 Encounter for screening for osteoporosis: Secondary | ICD-10-CM | POA: Diagnosis not present

## 2024-02-29 DIAGNOSIS — R300 Dysuria: Secondary | ICD-10-CM | POA: Diagnosis not present

## 2024-03-01 DIAGNOSIS — N390 Urinary tract infection, site not specified: Secondary | ICD-10-CM | POA: Diagnosis not present

## 2024-03-02 ENCOUNTER — Encounter (HOSPITAL_BASED_OUTPATIENT_CLINIC_OR_DEPARTMENT_OTHER): Admitting: Internal Medicine

## 2024-03-02 DIAGNOSIS — E11621 Type 2 diabetes mellitus with foot ulcer: Secondary | ICD-10-CM | POA: Diagnosis not present

## 2024-03-02 DIAGNOSIS — L97522 Non-pressure chronic ulcer of other part of left foot with fat layer exposed: Secondary | ICD-10-CM | POA: Diagnosis not present

## 2024-03-03 DIAGNOSIS — R300 Dysuria: Secondary | ICD-10-CM | POA: Diagnosis not present

## 2024-03-10 ENCOUNTER — Encounter (HOSPITAL_BASED_OUTPATIENT_CLINIC_OR_DEPARTMENT_OTHER): Attending: Internal Medicine | Admitting: Internal Medicine

## 2024-03-10 DIAGNOSIS — Z09 Encounter for follow-up examination after completed treatment for conditions other than malignant neoplasm: Secondary | ICD-10-CM | POA: Insufficient documentation

## 2024-03-10 DIAGNOSIS — E11621 Type 2 diabetes mellitus with foot ulcer: Secondary | ICD-10-CM | POA: Diagnosis not present

## 2024-03-10 DIAGNOSIS — L97422 Non-pressure chronic ulcer of left heel and midfoot with fat layer exposed: Secondary | ICD-10-CM | POA: Diagnosis not present

## 2024-03-10 DIAGNOSIS — L97522 Non-pressure chronic ulcer of other part of left foot with fat layer exposed: Secondary | ICD-10-CM | POA: Insufficient documentation

## 2024-03-13 DIAGNOSIS — J449 Chronic obstructive pulmonary disease, unspecified: Secondary | ICD-10-CM | POA: Diagnosis not present

## 2024-03-15 DIAGNOSIS — M150 Primary generalized (osteo)arthritis: Secondary | ICD-10-CM | POA: Diagnosis not present

## 2024-03-15 DIAGNOSIS — R51 Headache with orthostatic component, not elsewhere classified: Secondary | ICD-10-CM | POA: Diagnosis not present

## 2024-03-16 DIAGNOSIS — E1140 Type 2 diabetes mellitus with diabetic neuropathy, unspecified: Secondary | ICD-10-CM | POA: Diagnosis not present

## 2024-03-16 DIAGNOSIS — E119 Type 2 diabetes mellitus without complications: Secondary | ICD-10-CM | POA: Diagnosis not present

## 2024-03-16 DIAGNOSIS — R51 Headache with orthostatic component, not elsewhere classified: Secondary | ICD-10-CM | POA: Diagnosis not present

## 2024-03-17 ENCOUNTER — Encounter (HOSPITAL_BASED_OUTPATIENT_CLINIC_OR_DEPARTMENT_OTHER): Admitting: Internal Medicine

## 2024-03-17 DIAGNOSIS — L97522 Non-pressure chronic ulcer of other part of left foot with fat layer exposed: Secondary | ICD-10-CM | POA: Diagnosis not present

## 2024-03-17 DIAGNOSIS — E11621 Type 2 diabetes mellitus with foot ulcer: Secondary | ICD-10-CM | POA: Diagnosis not present

## 2024-03-17 DIAGNOSIS — Z09 Encounter for follow-up examination after completed treatment for conditions other than malignant neoplasm: Secondary | ICD-10-CM | POA: Diagnosis not present

## 2024-03-21 DIAGNOSIS — R799 Abnormal finding of blood chemistry, unspecified: Secondary | ICD-10-CM | POA: Diagnosis not present

## 2024-03-21 DIAGNOSIS — Z5181 Encounter for therapeutic drug level monitoring: Secondary | ICD-10-CM | POA: Diagnosis not present

## 2024-03-22 DIAGNOSIS — J449 Chronic obstructive pulmonary disease, unspecified: Secondary | ICD-10-CM | POA: Diagnosis not present

## 2024-03-24 ENCOUNTER — Ambulatory Visit (HOSPITAL_BASED_OUTPATIENT_CLINIC_OR_DEPARTMENT_OTHER): Admitting: Internal Medicine

## 2024-03-30 ENCOUNTER — Ambulatory Visit (HOSPITAL_BASED_OUTPATIENT_CLINIC_OR_DEPARTMENT_OTHER): Admitting: Internal Medicine

## 2024-04-05 DIAGNOSIS — E8770 Fluid overload, unspecified: Secondary | ICD-10-CM | POA: Diagnosis not present

## 2024-04-05 DIAGNOSIS — I517 Cardiomegaly: Secondary | ICD-10-CM | POA: Diagnosis not present

## 2024-04-05 DIAGNOSIS — R600 Localized edema: Secondary | ICD-10-CM | POA: Diagnosis not present

## 2024-04-06 DIAGNOSIS — M170 Bilateral primary osteoarthritis of knee: Secondary | ICD-10-CM | POA: Diagnosis not present

## 2024-04-06 DIAGNOSIS — I10 Essential (primary) hypertension: Secondary | ICD-10-CM | POA: Diagnosis not present

## 2024-04-06 DIAGNOSIS — I509 Heart failure, unspecified: Secondary | ICD-10-CM | POA: Diagnosis not present

## 2024-04-06 DIAGNOSIS — M1711 Unilateral primary osteoarthritis, right knee: Secondary | ICD-10-CM | POA: Diagnosis not present

## 2024-04-10 DIAGNOSIS — I10 Essential (primary) hypertension: Secondary | ICD-10-CM | POA: Diagnosis not present

## 2024-04-10 DIAGNOSIS — J449 Chronic obstructive pulmonary disease, unspecified: Secondary | ICD-10-CM | POA: Diagnosis not present

## 2024-04-10 DIAGNOSIS — M6281 Muscle weakness (generalized): Secondary | ICD-10-CM | POA: Diagnosis not present

## 2024-04-21 DIAGNOSIS — J449 Chronic obstructive pulmonary disease, unspecified: Secondary | ICD-10-CM | POA: Diagnosis not present

## 2024-04-21 DIAGNOSIS — G894 Chronic pain syndrome: Secondary | ICD-10-CM | POA: Diagnosis not present

## 2024-04-21 DIAGNOSIS — N390 Urinary tract infection, site not specified: Secondary | ICD-10-CM | POA: Diagnosis not present

## 2024-04-21 DIAGNOSIS — E1140 Type 2 diabetes mellitus with diabetic neuropathy, unspecified: Secondary | ICD-10-CM | POA: Diagnosis not present

## 2024-04-25 DIAGNOSIS — R799 Abnormal finding of blood chemistry, unspecified: Secondary | ICD-10-CM | POA: Diagnosis not present

## 2024-04-25 DIAGNOSIS — N390 Urinary tract infection, site not specified: Secondary | ICD-10-CM | POA: Diagnosis not present

## 2024-05-11 DIAGNOSIS — R609 Edema, unspecified: Secondary | ICD-10-CM | POA: Diagnosis not present

## 2024-05-16 DIAGNOSIS — M24561 Contracture, right knee: Secondary | ICD-10-CM | POA: Diagnosis not present

## 2024-05-16 DIAGNOSIS — R2681 Unsteadiness on feet: Secondary | ICD-10-CM | POA: Diagnosis not present

## 2024-05-16 DIAGNOSIS — M24562 Contracture, left knee: Secondary | ICD-10-CM | POA: Diagnosis not present

## 2024-05-17 DIAGNOSIS — R2681 Unsteadiness on feet: Secondary | ICD-10-CM | POA: Diagnosis not present

## 2024-05-17 DIAGNOSIS — M24561 Contracture, right knee: Secondary | ICD-10-CM | POA: Diagnosis not present

## 2024-05-17 DIAGNOSIS — M24562 Contracture, left knee: Secondary | ICD-10-CM | POA: Diagnosis not present

## 2024-05-17 DIAGNOSIS — M1711 Unilateral primary osteoarthritis, right knee: Secondary | ICD-10-CM | POA: Diagnosis not present

## 2024-05-18 DIAGNOSIS — M24562 Contracture, left knee: Secondary | ICD-10-CM | POA: Diagnosis not present

## 2024-05-18 DIAGNOSIS — R2681 Unsteadiness on feet: Secondary | ICD-10-CM | POA: Diagnosis not present

## 2024-05-18 DIAGNOSIS — M24561 Contracture, right knee: Secondary | ICD-10-CM | POA: Diagnosis not present

## 2024-05-18 DIAGNOSIS — M6281 Muscle weakness (generalized): Secondary | ICD-10-CM | POA: Diagnosis not present

## 2024-05-22 DIAGNOSIS — M24562 Contracture, left knee: Secondary | ICD-10-CM | POA: Diagnosis not present

## 2024-05-22 DIAGNOSIS — I509 Heart failure, unspecified: Secondary | ICD-10-CM | POA: Diagnosis not present

## 2024-05-22 DIAGNOSIS — M24561 Contracture, right knee: Secondary | ICD-10-CM | POA: Diagnosis not present

## 2024-05-22 DIAGNOSIS — J449 Chronic obstructive pulmonary disease, unspecified: Secondary | ICD-10-CM | POA: Diagnosis not present

## 2024-05-22 DIAGNOSIS — R2681 Unsteadiness on feet: Secondary | ICD-10-CM | POA: Diagnosis not present

## 2024-05-22 DIAGNOSIS — R609 Edema, unspecified: Secondary | ICD-10-CM | POA: Diagnosis not present

## 2024-05-22 DIAGNOSIS — Z713 Dietary counseling and surveillance: Secondary | ICD-10-CM | POA: Diagnosis not present

## 2024-05-23 DIAGNOSIS — M24562 Contracture, left knee: Secondary | ICD-10-CM | POA: Diagnosis not present

## 2024-05-23 DIAGNOSIS — R2681 Unsteadiness on feet: Secondary | ICD-10-CM | POA: Diagnosis not present

## 2024-05-23 DIAGNOSIS — M24561 Contracture, right knee: Secondary | ICD-10-CM | POA: Diagnosis not present

## 2024-05-23 DIAGNOSIS — R300 Dysuria: Secondary | ICD-10-CM | POA: Diagnosis not present

## 2024-05-24 DIAGNOSIS — N390 Urinary tract infection, site not specified: Secondary | ICD-10-CM | POA: Diagnosis not present

## 2024-05-24 DIAGNOSIS — M24562 Contracture, left knee: Secondary | ICD-10-CM | POA: Diagnosis not present

## 2024-05-24 DIAGNOSIS — M24561 Contracture, right knee: Secondary | ICD-10-CM | POA: Diagnosis not present

## 2024-05-24 DIAGNOSIS — M1711 Unilateral primary osteoarthritis, right knee: Secondary | ICD-10-CM | POA: Diagnosis not present

## 2024-05-24 DIAGNOSIS — R2681 Unsteadiness on feet: Secondary | ICD-10-CM | POA: Diagnosis not present

## 2024-05-24 DIAGNOSIS — M6281 Muscle weakness (generalized): Secondary | ICD-10-CM | POA: Diagnosis not present

## 2024-05-25 DIAGNOSIS — R2681 Unsteadiness on feet: Secondary | ICD-10-CM | POA: Diagnosis not present

## 2024-05-25 DIAGNOSIS — J449 Chronic obstructive pulmonary disease, unspecified: Secondary | ICD-10-CM | POA: Diagnosis not present

## 2024-05-25 DIAGNOSIS — M24562 Contracture, left knee: Secondary | ICD-10-CM | POA: Diagnosis not present

## 2024-05-25 DIAGNOSIS — I10 Essential (primary) hypertension: Secondary | ICD-10-CM | POA: Diagnosis not present

## 2024-05-25 DIAGNOSIS — I509 Heart failure, unspecified: Secondary | ICD-10-CM | POA: Diagnosis not present

## 2024-05-25 DIAGNOSIS — M24561 Contracture, right knee: Secondary | ICD-10-CM | POA: Diagnosis not present

## 2024-05-26 DIAGNOSIS — I509 Heart failure, unspecified: Secondary | ICD-10-CM | POA: Diagnosis not present

## 2024-05-26 DIAGNOSIS — I10 Essential (primary) hypertension: Secondary | ICD-10-CM | POA: Diagnosis not present

## 2024-05-29 DIAGNOSIS — I739 Peripheral vascular disease, unspecified: Secondary | ICD-10-CM | POA: Diagnosis not present

## 2024-05-29 DIAGNOSIS — G629 Polyneuropathy, unspecified: Secondary | ICD-10-CM | POA: Diagnosis not present

## 2024-05-29 DIAGNOSIS — M24562 Contracture, left knee: Secondary | ICD-10-CM | POA: Diagnosis not present

## 2024-05-29 DIAGNOSIS — M24561 Contracture, right knee: Secondary | ICD-10-CM | POA: Diagnosis not present

## 2024-05-29 DIAGNOSIS — R2681 Unsteadiness on feet: Secondary | ICD-10-CM | POA: Diagnosis not present

## 2024-05-29 DIAGNOSIS — I509 Heart failure, unspecified: Secondary | ICD-10-CM | POA: Diagnosis not present

## 2024-05-29 DIAGNOSIS — I10 Essential (primary) hypertension: Secondary | ICD-10-CM | POA: Diagnosis not present

## 2024-05-30 DIAGNOSIS — M24562 Contracture, left knee: Secondary | ICD-10-CM | POA: Diagnosis not present

## 2024-05-30 DIAGNOSIS — R2681 Unsteadiness on feet: Secondary | ICD-10-CM | POA: Diagnosis not present

## 2024-05-30 DIAGNOSIS — M24561 Contracture, right knee: Secondary | ICD-10-CM | POA: Diagnosis not present

## 2024-05-30 DIAGNOSIS — R799 Abnormal finding of blood chemistry, unspecified: Secondary | ICD-10-CM | POA: Diagnosis not present

## 2024-05-30 DIAGNOSIS — N390 Urinary tract infection, site not specified: Secondary | ICD-10-CM | POA: Diagnosis not present

## 2024-05-31 DIAGNOSIS — M1711 Unilateral primary osteoarthritis, right knee: Secondary | ICD-10-CM | POA: Diagnosis not present

## 2024-06-02 DIAGNOSIS — M24562 Contracture, left knee: Secondary | ICD-10-CM | POA: Diagnosis not present

## 2024-06-02 DIAGNOSIS — M24561 Contracture, right knee: Secondary | ICD-10-CM | POA: Diagnosis not present

## 2024-06-02 DIAGNOSIS — R2681 Unsteadiness on feet: Secondary | ICD-10-CM | POA: Diagnosis not present

## 2024-06-06 DIAGNOSIS — R2681 Unsteadiness on feet: Secondary | ICD-10-CM | POA: Diagnosis not present

## 2024-06-06 DIAGNOSIS — M24562 Contracture, left knee: Secondary | ICD-10-CM | POA: Diagnosis not present

## 2024-06-06 DIAGNOSIS — M24561 Contracture, right knee: Secondary | ICD-10-CM | POA: Diagnosis not present

## 2024-06-07 DIAGNOSIS — M24561 Contracture, right knee: Secondary | ICD-10-CM | POA: Diagnosis not present

## 2024-06-07 DIAGNOSIS — R2681 Unsteadiness on feet: Secondary | ICD-10-CM | POA: Diagnosis not present

## 2024-06-07 DIAGNOSIS — M6281 Muscle weakness (generalized): Secondary | ICD-10-CM | POA: Diagnosis not present

## 2024-06-07 DIAGNOSIS — M24562 Contracture, left knee: Secondary | ICD-10-CM | POA: Diagnosis not present

## 2024-06-08 DIAGNOSIS — M24562 Contracture, left knee: Secondary | ICD-10-CM | POA: Diagnosis not present

## 2024-06-08 DIAGNOSIS — R2681 Unsteadiness on feet: Secondary | ICD-10-CM | POA: Diagnosis not present

## 2024-06-08 DIAGNOSIS — M24561 Contracture, right knee: Secondary | ICD-10-CM | POA: Diagnosis not present

## 2024-06-13 DIAGNOSIS — M24561 Contracture, right knee: Secondary | ICD-10-CM | POA: Diagnosis not present

## 2024-06-13 DIAGNOSIS — R2681 Unsteadiness on feet: Secondary | ICD-10-CM | POA: Diagnosis not present

## 2024-06-13 DIAGNOSIS — M24562 Contracture, left knee: Secondary | ICD-10-CM | POA: Diagnosis not present

## 2024-06-14 DIAGNOSIS — I10 Essential (primary) hypertension: Secondary | ICD-10-CM | POA: Diagnosis not present

## 2024-06-14 DIAGNOSIS — M6281 Muscle weakness (generalized): Secondary | ICD-10-CM | POA: Diagnosis not present

## 2024-06-14 DIAGNOSIS — E1140 Type 2 diabetes mellitus with diabetic neuropathy, unspecified: Secondary | ICD-10-CM | POA: Diagnosis not present

## 2024-06-15 DIAGNOSIS — K5700 Diverticulitis of small intestine with perforation and abscess without bleeding: Secondary | ICD-10-CM | POA: Diagnosis not present

## 2024-06-15 DIAGNOSIS — K219 Gastro-esophageal reflux disease without esophagitis: Secondary | ICD-10-CM | POA: Diagnosis not present

## 2024-06-15 DIAGNOSIS — N390 Urinary tract infection, site not specified: Secondary | ICD-10-CM | POA: Diagnosis not present

## 2024-06-23 DIAGNOSIS — E1159 Type 2 diabetes mellitus with other circulatory complications: Secondary | ICD-10-CM | POA: Diagnosis not present

## 2024-06-23 DIAGNOSIS — B351 Tinea unguium: Secondary | ICD-10-CM | POA: Diagnosis not present

## 2024-06-27 DIAGNOSIS — K219 Gastro-esophageal reflux disease without esophagitis: Secondary | ICD-10-CM | POA: Diagnosis not present

## 2024-06-27 DIAGNOSIS — Z5181 Encounter for therapeutic drug level monitoring: Secondary | ICD-10-CM | POA: Diagnosis not present

## 2024-07-04 DIAGNOSIS — K219 Gastro-esophageal reflux disease without esophagitis: Secondary | ICD-10-CM | POA: Diagnosis not present

## 2024-07-07 DIAGNOSIS — K219 Gastro-esophageal reflux disease without esophagitis: Secondary | ICD-10-CM | POA: Diagnosis not present

## 2024-07-07 DIAGNOSIS — M1711 Unilateral primary osteoarthritis, right knee: Secondary | ICD-10-CM | POA: Diagnosis not present

## 2024-07-07 DIAGNOSIS — G4700 Insomnia, unspecified: Secondary | ICD-10-CM | POA: Diagnosis not present

## 2024-07-07 DIAGNOSIS — E1140 Type 2 diabetes mellitus with diabetic neuropathy, unspecified: Secondary | ICD-10-CM | POA: Diagnosis not present

## 2024-07-07 DIAGNOSIS — J449 Chronic obstructive pulmonary disease, unspecified: Secondary | ICD-10-CM | POA: Diagnosis not present

## 2024-07-13 DIAGNOSIS — B370 Candidal stomatitis: Secondary | ICD-10-CM | POA: Diagnosis not present

## 2024-07-13 DIAGNOSIS — R059 Cough, unspecified: Secondary | ICD-10-CM | POA: Diagnosis not present

## 2024-07-13 DIAGNOSIS — R0982 Postnasal drip: Secondary | ICD-10-CM | POA: Diagnosis not present

## 2024-07-14 DIAGNOSIS — E1140 Type 2 diabetes mellitus with diabetic neuropathy, unspecified: Secondary | ICD-10-CM | POA: Diagnosis not present

## 2024-07-14 DIAGNOSIS — Z5181 Encounter for therapeutic drug level monitoring: Secondary | ICD-10-CM | POA: Diagnosis not present

## 2024-07-14 DIAGNOSIS — J020 Streptococcal pharyngitis: Secondary | ICD-10-CM | POA: Diagnosis not present

## 2024-07-14 DIAGNOSIS — B370 Candidal stomatitis: Secondary | ICD-10-CM | POA: Diagnosis not present

## 2024-07-17 DIAGNOSIS — M5432 Sciatica, left side: Secondary | ICD-10-CM | POA: Diagnosis not present

## 2024-07-17 DIAGNOSIS — E119 Type 2 diabetes mellitus without complications: Secondary | ICD-10-CM | POA: Diagnosis not present

## 2024-07-19 DIAGNOSIS — N390 Urinary tract infection, site not specified: Secondary | ICD-10-CM | POA: Diagnosis not present

## 2024-07-20 DIAGNOSIS — E1165 Type 2 diabetes mellitus with hyperglycemia: Secondary | ICD-10-CM | POA: Diagnosis not present

## 2024-07-20 DIAGNOSIS — K5900 Constipation, unspecified: Secondary | ICD-10-CM | POA: Diagnosis not present

## 2024-07-20 DIAGNOSIS — R300 Dysuria: Secondary | ICD-10-CM | POA: Diagnosis not present

## 2024-07-22 DIAGNOSIS — J449 Chronic obstructive pulmonary disease, unspecified: Secondary | ICD-10-CM | POA: Diagnosis not present

## 2024-07-31 ENCOUNTER — Other Ambulatory Visit: Payer: Self-pay | Admitting: Medical Genetics

## 2024-07-31 DIAGNOSIS — Z006 Encounter for examination for normal comparison and control in clinical research program: Secondary | ICD-10-CM

## 2024-11-22 ENCOUNTER — Encounter (HOSPITAL_BASED_OUTPATIENT_CLINIC_OR_DEPARTMENT_OTHER): Admitting: Internal Medicine
